# Patient Record
Sex: Female | Born: 1935 | Race: Black or African American | Hispanic: No | Smoking: Never smoker
Health system: Southern US, Community
[De-identification: ages and names within clinical notes are randomized; demographics above are authoritative.]

## PROBLEM LIST (undated history)

## (undated) DIAGNOSIS — E119 Type 2 diabetes mellitus without complications: Secondary | ICD-10-CM

## (undated) DIAGNOSIS — J189 Pneumonia, unspecified organism: Secondary | ICD-10-CM

## (undated) DIAGNOSIS — M1990 Unspecified osteoarthritis, unspecified site: Secondary | ICD-10-CM

## (undated) DIAGNOSIS — B029 Zoster without complications: Secondary | ICD-10-CM

## (undated) DIAGNOSIS — E876 Hypokalemia: Secondary | ICD-10-CM

## (undated) DIAGNOSIS — I10 Essential (primary) hypertension: Secondary | ICD-10-CM

## (undated) DIAGNOSIS — I639 Cerebral infarction, unspecified: Secondary | ICD-10-CM

## (undated) HISTORY — DX: Zoster without complications: B02.9

## (undated) HISTORY — PX: ABDOMINAL HYSTERECTOMY: SHX81

## (undated) HISTORY — PX: KNEE SURGERY: SHX244

## (undated) HISTORY — PX: TONSILLECTOMY: SUR1361

## (undated) HISTORY — PX: BREAST EXCISIONAL BIOPSY: SUR124

## (undated) HISTORY — DX: Unspecified osteoarthritis, unspecified site: M19.90

## (undated) HISTORY — DX: Cerebral infarction, unspecified: I63.9

## (undated) HISTORY — DX: Pneumonia, unspecified organism: J18.9

---

## 1998-11-04 ENCOUNTER — Emergency Department (HOSPITAL_COMMUNITY): Admission: EM | Admit: 1998-11-04 | Discharge: 1998-11-04 | Payer: Self-pay | Admitting: Emergency Medicine

## 1998-12-15 ENCOUNTER — Emergency Department (HOSPITAL_COMMUNITY): Admission: EM | Admit: 1998-12-15 | Discharge: 1998-12-15 | Payer: Self-pay | Admitting: Emergency Medicine

## 1998-12-15 ENCOUNTER — Encounter: Payer: Self-pay | Admitting: Emergency Medicine

## 1998-12-18 ENCOUNTER — Emergency Department (HOSPITAL_COMMUNITY): Admission: EM | Admit: 1998-12-18 | Discharge: 1998-12-18 | Payer: Self-pay | Admitting: Emergency Medicine

## 1998-12-19 ENCOUNTER — Emergency Department (HOSPITAL_COMMUNITY): Admission: EM | Admit: 1998-12-19 | Discharge: 1998-12-19 | Payer: Self-pay | Admitting: Emergency Medicine

## 1998-12-21 ENCOUNTER — Emergency Department (HOSPITAL_COMMUNITY): Admission: EM | Admit: 1998-12-21 | Discharge: 1998-12-21 | Payer: Self-pay | Admitting: Emergency Medicine

## 1999-12-03 ENCOUNTER — Ambulatory Visit (HOSPITAL_COMMUNITY): Admission: RE | Admit: 1999-12-03 | Discharge: 1999-12-03 | Payer: Self-pay | Admitting: Gastroenterology

## 1999-12-03 ENCOUNTER — Encounter: Payer: Self-pay | Admitting: Gastroenterology

## 2000-01-06 ENCOUNTER — Encounter: Admission: RE | Admit: 2000-01-06 | Discharge: 2000-01-06 | Payer: Self-pay | Admitting: Obstetrics and Gynecology

## 2000-01-06 ENCOUNTER — Encounter: Payer: Self-pay | Admitting: Obstetrics and Gynecology

## 2000-01-14 ENCOUNTER — Ambulatory Visit (HOSPITAL_COMMUNITY): Admission: RE | Admit: 2000-01-14 | Discharge: 2000-01-14 | Payer: Self-pay | Admitting: Gastroenterology

## 2001-03-05 ENCOUNTER — Encounter: Payer: Self-pay | Admitting: Obstetrics and Gynecology

## 2001-03-05 ENCOUNTER — Encounter: Admission: RE | Admit: 2001-03-05 | Discharge: 2001-03-05 | Payer: Self-pay | Admitting: Obstetrics and Gynecology

## 2001-09-14 ENCOUNTER — Encounter: Payer: Self-pay | Admitting: Emergency Medicine

## 2001-09-14 ENCOUNTER — Emergency Department (HOSPITAL_COMMUNITY): Admission: EM | Admit: 2001-09-14 | Discharge: 2001-09-14 | Payer: Self-pay | Admitting: Emergency Medicine

## 2003-04-09 ENCOUNTER — Emergency Department (HOSPITAL_COMMUNITY): Admission: EM | Admit: 2003-04-09 | Discharge: 2003-04-09 | Payer: Self-pay | Admitting: Emergency Medicine

## 2003-04-25 ENCOUNTER — Encounter: Admission: RE | Admit: 2003-04-25 | Discharge: 2003-04-25 | Payer: Self-pay | Admitting: Obstetrics and Gynecology

## 2003-04-25 ENCOUNTER — Encounter: Payer: Self-pay | Admitting: Obstetrics and Gynecology

## 2003-07-21 ENCOUNTER — Emergency Department (HOSPITAL_COMMUNITY): Admission: EM | Admit: 2003-07-21 | Discharge: 2003-07-21 | Payer: Self-pay | Admitting: Emergency Medicine

## 2003-09-29 ENCOUNTER — Emergency Department (HOSPITAL_COMMUNITY): Admission: EM | Admit: 2003-09-29 | Discharge: 2003-09-29 | Payer: Self-pay | Admitting: Emergency Medicine

## 2003-11-15 ENCOUNTER — Emergency Department (HOSPITAL_COMMUNITY): Admission: EM | Admit: 2003-11-15 | Discharge: 2003-11-15 | Payer: Self-pay | Admitting: Emergency Medicine

## 2004-05-29 ENCOUNTER — Emergency Department (HOSPITAL_COMMUNITY): Admission: EM | Admit: 2004-05-29 | Discharge: 2004-05-29 | Payer: Self-pay | Admitting: Emergency Medicine

## 2005-04-10 ENCOUNTER — Encounter: Admission: RE | Admit: 2005-04-10 | Discharge: 2005-04-10 | Payer: Self-pay | Admitting: Cardiology

## 2005-05-28 ENCOUNTER — Ambulatory Visit (HOSPITAL_COMMUNITY): Admission: RE | Admit: 2005-05-28 | Discharge: 2005-05-28 | Payer: Self-pay | Admitting: Cardiology

## 2007-06-05 ENCOUNTER — Emergency Department (HOSPITAL_COMMUNITY): Admission: EM | Admit: 2007-06-05 | Discharge: 2007-06-05 | Payer: Self-pay | Admitting: Emergency Medicine

## 2007-12-15 ENCOUNTER — Emergency Department (HOSPITAL_COMMUNITY): Admission: EM | Admit: 2007-12-15 | Discharge: 2007-12-15 | Payer: Self-pay | Admitting: Emergency Medicine

## 2008-05-09 ENCOUNTER — Encounter: Admission: RE | Admit: 2008-05-09 | Discharge: 2008-05-09 | Payer: Self-pay | Admitting: Internal Medicine

## 2009-10-22 ENCOUNTER — Emergency Department (HOSPITAL_COMMUNITY): Admission: EM | Admit: 2009-10-22 | Discharge: 2009-10-22 | Payer: Self-pay | Admitting: Emergency Medicine

## 2010-05-11 ENCOUNTER — Emergency Department (HOSPITAL_COMMUNITY)
Admission: EM | Admit: 2010-05-11 | Discharge: 2010-05-11 | Payer: Self-pay | Source: Home / Self Care | Admitting: Emergency Medicine

## 2010-12-20 NOTE — Procedures (Signed)
[facility]  Patient:    Anna Hoffman, Anna Hoffman                          MRN: [id number] Proc. Date: 12/03/99 Adm. Date:  [id number] Disc. Date: [id number] Attending:  Harriette Bouillon CC:         Glory Buff, M.D. [facility]                           Procedure Report  PROCEDURE:  Panendoscopy.  INDICATIONS:  The patient is a 76 year old African-American female undergoing endoscopy to evaluate complaints of episodic epigastric pain, associated nausea, vomiting and diarrhea.  Symptoms typically occurred after eating sweets.  The pain lasts for several hours of residual nausea, vomiting and diarrhea are protracted over the next several days.  Abdominal ultrasound was negative.  She is undergoing endoscopy to evaluate further possible GI pathology.  DESCRIPTION OF PROCEDURE:  After reviewing the nature of the procedure with the  patient including potential risks and complications, and after discussing alternatives methods of diagnosis and treatment, informed consent signed.  The patient was premedicated, receiving topical anesthetic followed by IV sedation totalling Versed 5 mg, fentanyl 50 mcg IV.  Using Olympus video endoscope the proximal esophagus was intubated under direct  vision.  Normal oropharynx.  No lesion of the epiglottis, vocal cords or piriform sinus.  Proximal mid and distal segments of the esophagus normal.  Mucosal was Z-line distinct at 34 cm.  Large hiatal hernia extending to 38 cm. Non-inflamed  Gastric, fundus, body and antrum normal.  Pylorus symmetric.  Duodenal bulb and  second portion normal.  Retroflexed view of the annularis/curved gastric fundus  negative.  Stomach decompressed.  Scope withdrawn.  The patient tolerated the procedure without difficulty, being maintained on Datascope monitor and low-flow oxygen throughout.  ASSESSMENT: 1. Hiatal hernia--large, non-inflamed. 2. Normal endoscopy--no lesion to explain  abdominal pain or symptoms of    nausea, vomiting, diarrhea.  Possible reactive hypoglycemia.  RECOMMENDATIONS: 1. Colonoscopy as planned. 2. Continue to avoid sweets which has resulted in resolution of symptoms    thus far. DD:  12/03/99 TD:  12/05/99 Job: [id number]

## 2010-12-20 NOTE — Procedures (Signed)
[facility]  Patient:    Anna Hoffman, Anna Hoffman                          MRN: [id number] Proc. Date: 01/14/00 Adm. Date:  [id number] Disc. Date: [id number] Attending:  Harriette Bouillon CC:         Glory Buff, M.D. [facility]                           Procedure Report  PROCEDURE PERFORMED:  Colonoscopy.  ENDOSCOPIST:  Mayme Genta, M.D.  INDICATIONS FOR PROCEDURE:  The patient is a 76 year old African-American female undergoing colonoscopy for neoplasia surveillance and to evaluate complaints of diarrhea, nausea and vomiting.  DESCRIPTION OF PROCEDURE:  After reviewing the nature of the procedure with the patient including potential risks and complications, and after discussing alternative methods of diagnosis and treatment, informed consent was signed. The patient was premedicated receiving Versed 7.5 mg, fentanyl 75 mcg administered IV in divided doses prior to the onset of the procedure.  Using an Olympus pediatric PCF-140L video colonoscope, rectum was intubated after normal digital examination.  Scope was advanced around the entire length of the colon to the cecum, identified by the appendiceal orifice and ileocecal valve.  Intubation was difficult due to redundancy of the colon.  The terminal ileum was intubated over its distal 10 cm and appeared entirely normal.  Scopte retracted into the cecum.  The remainder of the colon was examined in retrograde manner with excellent digitalization throughout.  No abnormalities noted. No evidence of neoplasia, mucosal inflammation, diverticular disease or vascular abnormality.  Retroflex view of the rectal vault was similarly normal.  The colon was decompressed and scope withdrawn.  The patient tolerated the procedure without difficulty being maintained on Datascope monitor and low-flow oxygen throughout.  Returned to recovery in stable condition.  TIME:  2.  TECHNICAL:  2.  PREPARATION:  2.  TOTAL  SCORE:  6  ASSESSMENT: 1. Normal colonoscopy. 2. No colorectal neoplasia. 3. No lesion to explain diarrhea.  RECOMMENDATIONS: 1. Repeat colonoscopy in 10 years. 2. Hemoccult--anually. 3. ROV p.r.n.   RECOMMENDATIONS: 1. Postpolypectomy instructions reviewed. 2. Follow up pathology. 3. Repeat colonoscopy in five years for ongoing surveillance. DD:  01/14/00 TD:  01/17/00 Job: [id number]

## 2011-02-05 ENCOUNTER — Emergency Department (HOSPITAL_COMMUNITY): Payer: Medicare Other

## 2011-02-05 ENCOUNTER — Emergency Department (HOSPITAL_COMMUNITY)
Admission: EM | Admit: 2011-02-05 | Discharge: 2011-02-05 | Disposition: A | Discharge status: Home / Self Care | Payer: Medicare Other | Attending: Emergency Medicine | Admitting: Emergency Medicine

## 2011-02-05 DIAGNOSIS — M25579 Pain in unspecified ankle and joints of unspecified foot: Secondary | ICD-10-CM | POA: Insufficient documentation

## 2011-02-05 DIAGNOSIS — S0990XA Unspecified injury of head, initial encounter: Secondary | ICD-10-CM | POA: Insufficient documentation

## 2011-02-05 DIAGNOSIS — S0003XA Contusion of scalp, initial encounter: Secondary | ICD-10-CM | POA: Insufficient documentation

## 2011-02-05 DIAGNOSIS — I10 Essential (primary) hypertension: Secondary | ICD-10-CM | POA: Insufficient documentation

## 2011-02-05 DIAGNOSIS — S1093XA Contusion of unspecified part of neck, initial encounter: Secondary | ICD-10-CM | POA: Insufficient documentation

## 2011-02-05 DIAGNOSIS — M5030 Other cervical disc degeneration, unspecified cervical region: Secondary | ICD-10-CM | POA: Insufficient documentation

## 2011-02-05 DIAGNOSIS — W1809XA Striking against other object with subsequent fall, initial encounter: Secondary | ICD-10-CM | POA: Insufficient documentation

## 2011-02-05 DIAGNOSIS — M1990 Unspecified osteoarthritis, unspecified site: Secondary | ICD-10-CM | POA: Insufficient documentation

## 2011-09-10 ENCOUNTER — Encounter (HOSPITAL_COMMUNITY): Payer: Self-pay | Admitting: Physician Assistant

## 2011-09-10 ENCOUNTER — Emergency Department (INDEPENDENT_AMBULATORY_CARE_PROVIDER_SITE_OTHER)
Admission: EM | Admit: 2011-09-10 | Discharge: 2011-09-10 | Disposition: A | Discharge status: Home / Self Care | Payer: Medicare Other | Source: Home / Self Care

## 2011-09-10 DIAGNOSIS — I10 Essential (primary) hypertension: Secondary | ICD-10-CM

## 2011-09-10 DIAGNOSIS — J019 Acute sinusitis, unspecified: Secondary | ICD-10-CM

## 2011-09-10 HISTORY — DX: Essential (primary) hypertension: I10

## 2011-09-10 MED ORDER — AMOXICILLIN 500 MG PO CAPS: 500.0000 mg | ORAL_CAPSULE | Freq: Three times a day (TID) | ORAL | Status: AC

## 2011-09-10 NOTE — ED Provider Notes (Signed)
History     CSN: [id number]  Arrival date & time 09/10/11  1514   None     Chief Complaint  Patient presents with  . Sinusitis  . Hypertension    (Consider location/radiation/quality/duration/timing/severity/associated sxs/prior treatment) HPI Comments: Pt reports onset 3 days ago of chills. Yesterday she began having sore throat, yellow nasal drainage, post nasal drainage, headache and sinus pressure. She tried to schedule an appt with her PCP today but didn't have available openings. Pt states she didn't take her BP medication today because she took Advil for her cold symptoms. She denies cough, dyspnea or chest pain.    Past Medical History  Diagnosis Date  . Hypertension     No past surgical history on file.  No family history on file.  History  Substance Use Topics  . Smoking status: Not on file  . Smokeless tobacco: Not on file  . Alcohol Use:     OB History    Grav Para Term Preterm Abortions TAB SAB Ect Mult Living                  Review of Systems  Constitutional: Positive for fatigue. Negative for fever and chills.  HENT: Positive for ear pain, congestion, sore throat, rhinorrhea, postnasal drip and sinus pressure. Negative for sneezing.   Respiratory: Negative for cough, shortness of breath and wheezing.   Cardiovascular: Negative for chest pain and palpitations.    Allergies  Review of patient's allergies indicates no known allergies.  Home Medications   Current Outpatient Rx  Name Route Sig Dispense Refill  . AMOXICILLIN 500 MG PO CAPS Oral Take 1 capsule (500 mg total) by mouth 3 (three) times daily. 30 capsule 0    BP 180/102  Pulse 93  Temp(Src) 98.1 F (36.7 C) (Oral)  Resp 16  SpO2 96%  Physical Exam  Nursing note and vitals reviewed. Constitutional: She appears well-developed and well-nourished. No distress.  HENT:  Head: Normocephalic and atraumatic.  Right Ear: Tympanic membrane, external ear and ear canal normal.  Left Ear:  Tympanic membrane, external ear and ear canal normal.  Nose: Nose normal.  Mouth/Throat: Uvula is midline, oropharynx is clear and moist and mucous membranes are normal. No oropharyngeal exudate, posterior oropharyngeal edema or posterior oropharyngeal erythema.  Neck: Neck supple.  Cardiovascular: Normal rate, regular rhythm and normal heart sounds.   Pulmonary/Chest: Effort normal and breath sounds normal. No respiratory distress.  Lymphadenopathy:    She has no cervical adenopathy.  Neurological: She is alert.  Skin: Skin is warm and dry.  Psychiatric: She has a normal mood and affect.    ED Course  Procedures (including critical care time)  Labs Reviewed - No data to display No results found.   1. Sinusitis acute   2. Hypertension       MDM  Hx of HTN. Pt reports that she has not taken BP medication today, but is usually compliant with medication. Onset of sinus pressure and nasal congestion with discolored nasal mucus yesterday. Chills without fever x 4 days.         Carmel [place], [place] 09/10/11 [phone]

## 2011-09-10 NOTE — ED Notes (Signed)
PT HERE WITH SINUS SX SORE THROAT,RUNNY NOSE,POST NASAL DRAINAGE AND H/A THAT STARTED YESTERDAY.BIALT EARS POPPING AND BP 180/102.PT HAS HX HTN AND DIDN'T TAKE MEDS TODAY.SINUS MEDS TAKEN BUT NO RELIEF

## 2011-09-15 NOTE — ED Provider Notes (Signed)
Medical screening examination/treatment/procedure(s) were performed by non-physician practitioner and as supervising physician I was immediately available for consultation/collaboration.  Howell Rucks, MD 09/15/11 1454

## 2011-12-06 ENCOUNTER — Emergency Department (HOSPITAL_COMMUNITY)
Admission: EM | Admit: 2011-12-06 | Discharge: 2011-12-06 | Disposition: A | Discharge status: Home / Self Care | Payer: Medicare Other | Attending: Emergency Medicine | Admitting: Emergency Medicine

## 2011-12-06 ENCOUNTER — Emergency Department (HOSPITAL_COMMUNITY): Payer: Medicare Other

## 2011-12-06 ENCOUNTER — Encounter (HOSPITAL_COMMUNITY): Payer: Self-pay | Admitting: Family Medicine

## 2011-12-06 DIAGNOSIS — W19XXXA Unspecified fall, initial encounter: Secondary | ICD-10-CM

## 2011-12-06 DIAGNOSIS — Y9229 Other specified public building as the place of occurrence of the external cause: Secondary | ICD-10-CM | POA: Insufficient documentation

## 2011-12-06 DIAGNOSIS — R51 Headache: Secondary | ICD-10-CM | POA: Insufficient documentation

## 2011-12-06 DIAGNOSIS — S0003XA Contusion of scalp, initial encounter: Secondary | ICD-10-CM | POA: Insufficient documentation

## 2011-12-06 DIAGNOSIS — S7010XA Contusion of unspecified thigh, initial encounter: Secondary | ICD-10-CM | POA: Insufficient documentation

## 2011-12-06 DIAGNOSIS — I10 Essential (primary) hypertension: Secondary | ICD-10-CM | POA: Insufficient documentation

## 2011-12-06 DIAGNOSIS — T1490XA Injury, unspecified, initial encounter: Secondary | ICD-10-CM | POA: Insufficient documentation

## 2011-12-06 DIAGNOSIS — W010XXA Fall on same level from slipping, tripping and stumbling without subsequent striking against object, initial encounter: Secondary | ICD-10-CM | POA: Insufficient documentation

## 2011-12-06 MED ORDER — ACETAMINOPHEN 325 MG PO TABS
650.0000 mg | ORAL_TABLET | Freq: Once | ORAL | Status: AC
  Administered 2011-12-06: 650 mg via ORAL
  Filled 2011-12-06: qty 2

## 2011-12-06 MED ORDER — ONDANSETRON HCL 4 MG PO TABS: 8.0000 mg | ORAL_TABLET | Freq: Three times a day (TID) | ORAL | Status: AC | PRN

## 2011-12-06 NOTE — ED Notes (Signed)
Patient states that she was at the [employer] this morning and tripped and fell and fell onto the concrete striking her head. Denies LOC or visual changes. Reports having a headache and intermittent nausea.

## 2011-12-06 NOTE — ED Notes (Signed)
When pt states she felt sick, I ask if she was nausea she said no,  Just "sick feeling",  No visual disturbances,  No vomiting, no LOC

## 2011-12-06 NOTE — ED Provider Notes (Signed)
History     CSN: [id number]  Arrival date & time 12/06/11  1932   First MD Initiated Contact with Patient 12/06/11 2049      Chief Complaint  Patient presents with  . Fall  . Head Injury  . Headache    (Consider location/radiation/quality/duration/timing/severity/associated sxs/prior treatment) HPI Patient tripped and fell this morning while walking in the [employer] striking her fore head and her right thigh. She complains of frontal headache, nonradiating dull and right lateral thigh pain. Denies neck pain pain is mild associated symptoms include nausea pain is not made better or worse by anything. No other associated symptoms. Patient has been ambulatory since the event Past Medical History  Diagnosis Date  . Hypertension     Past Surgical History  Procedure Date  . Abdominal hysterectomy   . Knee surgery     No family history on file.  History  Substance Use Topics  . Smoking status: Never Smoker   . Smokeless tobacco: Not on file  . Alcohol Use: No    OB History    Grav Para Term Preterm Abortions TAB SAB Ect Mult Living                  Review of Systems  Constitutional: Negative.   Respiratory: Negative.   Cardiovascular: Negative.   Gastrointestinal: Positive for nausea.  Musculoskeletal: Positive for myalgias.       Pain at right THIGH   Skin: Negative.   Neurological: Positive for headaches.  Hematological: Negative.   Psychiatric/Behavioral: Negative.   All other systems reviewed and are negative.    Allergies  Amoxicillin and Sulfa antibiotics  Home Medications   Current Outpatient Rx  Name Route Sig Dispense Refill  . FEXOFENADINE HCL 180 MG PO TABS Oral Take 180 mg by mouth daily.    . STRESS TAB NF PO Oral Take 1 tablet by mouth daily.    Marland Kitchen OLMESARTAN-AMLODIPINE-HCTZ 20-5-12.5 MG PO TABS Oral Take 1 tablet by mouth daily.    Marland Kitchen VITAMIN B-12 1000 MCG PO TABS Oral Take 1,000 mcg by mouth daily.      BP 136/83  Pulse 101   Temp(Src) 97.7 F (36.5 C) (Oral)  Resp 18  SpO2 97%  Physical Exam  Nursing note and vitals reviewed. Constitutional: She is oriented to person, place, and time. She appears well-developed and well-nourished.  HENT:       3 cm hematoma with ecchymosis left for head otherwise normocephalic atraumatic. Bilateral tympanic membranes normal  Eyes: Conjunctivae are normal. Pupils are equal, round, and reactive to light.  Neck: Neck supple. No tracheal deviation present. No thyromegaly present.       No tenderness  Cardiovascular: Normal rate and regular rhythm.   No murmur heard. Pulmonary/Chest: Effort normal and breath sounds normal.  Abdominal: Soft. Bowel sounds are normal. She exhibits no distension. There is no tenderness.  Musculoskeletal: Normal range of motion. She exhibits no edema and no tenderness.       Right lower extremity there is a baseball sized eccymosis at lateral thigh, reddish purple in appearance. No deformity no point tenderness. Entire spine nontender pelvis stable nontender. All 4 extremities neurovascularly intact  Neurological: She is alert and oriented to person, place, and time. She has normal reflexes. No cranial nerve deficit. Coordination normal.       Gait normal  Skin: Skin is warm and dry. No rash noted.  Psychiatric: She has a normal mood and affect.    ED  Course  Procedures (including critical care time)  Labs Reviewed - No data to display No results found.   No diagnosis found.  10:30 PM feels improved after treatment with Tylenol. Patient alert Glasgow Coma Score 15  MDM  Plan prescriptions Zofran. Tylenol for pain Diagnosis  #1 fall #2 minor closed head injury #3 contusion right thigh        Orlie Dakin, MD 12/06/11 2234

## 2011-12-06 NOTE — ED Notes (Addendum)
Pt presented to the ER with c/o headache and head injury secondary to the fall, pt states that her foot got cot in the car wheel and fell down hitting the head on the concrete. Noted hematoma to the left frontal area of the head. Py also reprots nausea and dizziness.

## 2011-12-06 NOTE — ED Notes (Signed)
Pt states she is ready for disposition Dr Milagros Evener aware,  Pt informed

## 2011-12-06 NOTE — ED Notes (Signed)
Patient transported to CT 

## 2012-11-10 ENCOUNTER — Emergency Department (HOSPITAL_COMMUNITY): Payer: Medicare Other

## 2012-11-10 ENCOUNTER — Emergency Department (HOSPITAL_COMMUNITY)
Admission: EM | Admit: 2012-11-10 | Discharge: 2012-11-10 | Disposition: A | Discharge status: Home / Self Care | Payer: Medicare Other | Attending: Emergency Medicine | Admitting: Emergency Medicine

## 2012-11-10 ENCOUNTER — Encounter (HOSPITAL_COMMUNITY): Payer: Self-pay | Admitting: Emergency Medicine

## 2012-11-10 DIAGNOSIS — R0989 Other specified symptoms and signs involving the circulatory and respiratory systems: Secondary | ICD-10-CM | POA: Insufficient documentation

## 2012-11-10 DIAGNOSIS — I10 Essential (primary) hypertension: Secondary | ICD-10-CM | POA: Insufficient documentation

## 2012-11-10 DIAGNOSIS — H9209 Otalgia, unspecified ear: Secondary | ICD-10-CM | POA: Insufficient documentation

## 2012-11-10 DIAGNOSIS — J3489 Other specified disorders of nose and nasal sinuses: Secondary | ICD-10-CM | POA: Insufficient documentation

## 2012-11-10 DIAGNOSIS — R509 Fever, unspecified: Secondary | ICD-10-CM | POA: Insufficient documentation

## 2012-11-10 DIAGNOSIS — R51 Headache: Secondary | ICD-10-CM | POA: Insufficient documentation

## 2012-11-10 DIAGNOSIS — J029 Acute pharyngitis, unspecified: Secondary | ICD-10-CM | POA: Insufficient documentation

## 2012-11-10 DIAGNOSIS — J069 Acute upper respiratory infection, unspecified: Secondary | ICD-10-CM | POA: Insufficient documentation

## 2012-11-10 MED ORDER — FENTANYL CITRATE 0.05 MG/ML IJ SOLN
100.0000 ug | Freq: Once | INTRAMUSCULAR | Status: DC
Start: 2012-11-10 — End: 2012-11-10

## 2012-11-10 NOTE — ED Provider Notes (Signed)
History     CSN: [id number]  Arrival date & time 11/10/12  1510   None     Chief Complaint  Patient presents with  . Sore Throat  . Sneezing   . Cough    (Consider location/radiation/quality/duration/timing/severity/associated sxs/prior treatment) HPI 78 yo F with HTN presenting with 2 days of subjective fever, cough, nasal congestion, rhinorrhea, sore throat and ear pain.  Subjective fever on Monday and Tuesday night.  No fevers today.  Denies any shortness of breath, chest pain, abdominal pain, n/v/d.  Thinks she may be developing a sinus infection due to greenish nasal discharge and some frontal headache.  Eating and drinking well at home.  Came in today because she was unable to see her doctor until Monday.   Past Medical History  Diagnosis Date  . Hypertension     Past Surgical History  Procedure Laterality Date  . Abdominal hysterectomy    . Knee surgery      No family history on file.  History  Substance Use Topics  . Smoking status: Never Smoker   . Smokeless tobacco: Not on file  . Alcohol Use: No    OB History   Grav Para Term Preterm Abortions TAB SAB Ect Mult Living                  Review of Systems  Constitutional: Positive for fever. Negative for fatigue.  HENT: Positive for ear pain, congestion, sore throat, rhinorrhea, sneezing and sinus pressure. Negative for trouble swallowing, neck pain and neck stiffness.   Eyes: Negative.   Respiratory: Positive for cough. Negative for shortness of breath.   Cardiovascular: Negative for chest pain.  Gastrointestinal: Negative for nausea, vomiting, abdominal pain and diarrhea.  Genitourinary: Negative.   Musculoskeletal: Negative.   Skin: Negative.   Neurological: Negative.     Allergies  Amoxicillin and Sulfa antibiotics  Home Medications   Current Outpatient Rx  Name  Route  Sig  Dispense  Refill  . dextromethorphan (DELSYM) 30 MG/5ML liquid   Oral   Take 60 mg by mouth 2 (two) times daily as  needed for cough.         . fexofenadine (ALLEGRA) 180 MG tablet   Oral   Take 180 mg by mouth daily.         Marland Kitchen ibuprofen (ADVIL,MOTRIN) 200 MG tablet   Oral   Take 400 mg by mouth every 8 (eight) hours as needed for pain.         . Multiple Vitamins-Minerals (STRESS TAB NF PO)   Oral   Take 1 tablet by mouth daily.         . Olmesartan-Amlodipine-HCTZ (TRIBENZOR) 20-5-12.5 MG TABS   Oral   Take 1 tablet by mouth daily.         Marland Kitchen oxymetazoline (AFRIN) 0.05 % nasal spray   Nasal   Place 2 sprays into the nose 2 (two) times daily as needed for congestion.         . vitamin B-12 (CYANOCOBALAMIN) 1000 MCG tablet   Oral   Take 1,000 mcg by mouth daily.           BP 138/89  Pulse 89  Temp(Src) 97.8 F (36.6 C) (Oral)  Resp 16  SpO2 97%  Physical Exam  Constitutional: She is oriented to person, place, and time. She appears well-developed and well-nourished. No distress.  HENT:  Head: Normocephalic and atraumatic.  Right Ear: Tympanic membrane and external ear normal.  Left  Ear: Tympanic membrane and external ear normal.  Mouth/Throat: Oropharynx is clear and moist. No oropharyngeal exudate.  Mild tenderness over right maxillary sinus  Eyes: Conjunctivae and EOM are normal. Pupils are equal, round, and reactive to light. Right eye exhibits no discharge. Left eye exhibits no discharge. No scleral icterus.  Neck: Normal range of motion. Neck supple.  Cardiovascular: Normal rate, regular rhythm, normal heart sounds and intact distal pulses.   No murmur heard. Pulmonary/Chest: Effort normal and breath sounds normal. No respiratory distress. She has no wheezes. She has no rales.  Scattered rhonchi that cleared with cough  Abdominal: Soft. Bowel sounds are normal. There is no tenderness. There is no rebound and no guarding.  Musculoskeletal: She exhibits no edema and no tenderness.  Lymphadenopathy:    She has no cervical adenopathy.  Neurological: She is alert  and oriented to person, place, and time. She exhibits normal muscle tone.  Skin: Skin is warm and dry. No rash noted. She is not diaphoretic. No erythema.    ED Course  Procedures (including critical care time)  Labs Reviewed - No data to display Dg Chest 2 View  11/10/2012  *RADIOLOGY REPORT*  Clinical Data: Cough.  CHEST - 2 VIEW  Comparison: Two-view chest 04/09/2005.  Findings: The heart is mildly enlarged.  A moderate sized hiatal hernia is again seen.  The lung volumes are low.  No focal airspace disease is evident.  The visualized soft tissues and bony thorax are unremarkable.  IMPRESSION:  1.  Borderline cardiomegaly without failure. 2.  The moderate sized hiatal hernia. 3.  No acute cardiopulmonary disease.   Original Report Authenticated By: San Morelle, M.D.      No diagnosis found.    MDM  78 yo F with HTN presenting with URI symptoms and subjective fever.  Afebrile here.  Tolerating PO well at home without n/v.  CXR negative for pneumonia.  Exam unremarkable.  Discussed symptomatic care with tylenol/ibuprofen for fevers/headache and netti-pot for sinus symptoms.  Discussed expected time course of viral illness.  Follow up with PCP on Monday if not improving.        Sibyl Parr, MD 11/10/12 [phone]

## 2012-11-10 NOTE — ED Notes (Signed)
Pt states that since Monday night she has had a cough, sore throat, and sneezing since Monday night.  States that she thinks her allergies are acting up.

## 2012-11-10 NOTE — ED Notes (Signed)
Resident at bedside speaking with pt.

## 2012-11-11 NOTE — ED Provider Notes (Signed)
I saw  the patient, reviewed the resident's note and I agree with the findings and plan.   .Face to face Exam:  General:  Awake HEENT:  Atraumatic Resp:  Normal effort Abd:  Nondistended Neuro:No focal weakness   Dot Lanes, MD 11/11/12 1306

## 2012-12-30 ENCOUNTER — Other Ambulatory Visit: Payer: Self-pay | Admitting: Internal Medicine

## 2012-12-30 DIAGNOSIS — Z1231 Encounter for screening mammogram for malignant neoplasm of breast: Secondary | ICD-10-CM

## 2013-02-02 ENCOUNTER — Ambulatory Visit: Payer: Medicare Other

## 2013-02-24 ENCOUNTER — Ambulatory Visit
Admission: RE | Admit: 2013-02-24 | Discharge: 2013-02-24 | Disposition: A | Discharge status: Home / Self Care | Payer: Medicare Other | Source: Ambulatory Visit | Attending: Internal Medicine | Admitting: Internal Medicine

## 2013-02-24 DIAGNOSIS — Z1231 Encounter for screening mammogram for malignant neoplasm of breast: Secondary | ICD-10-CM

## 2013-06-08 ENCOUNTER — Encounter: Payer: Medicare Other | Attending: Internal Medicine | Admitting: Dietician

## 2013-06-08 VITALS — Ht 62.5 in | Wt 148.8 lb

## 2013-06-08 DIAGNOSIS — Z713 Dietary counseling and surveillance: Secondary | ICD-10-CM | POA: Insufficient documentation

## 2013-06-08 DIAGNOSIS — E119 Type 2 diabetes mellitus without complications: Secondary | ICD-10-CM | POA: Insufficient documentation

## 2013-06-08 NOTE — Progress Notes (Signed)
Patient was seen on 06/08/13 for the first of a series of three diabetes self-management courses at the [facility].  Current HbA1c: 6.7 on 9/11  The following learning objectives were met by the patient during this class:  Describe diabetes  State some common risk factors for diabetes  Defines the role of glucose and insulin  Identifies type of diabetes and pathophysiology  Describe the relationship between diabetes and cardiovascular risk  State the members of the Healthcare Team  States the rationale for glucose monitoring  State when to test glucose  State their individual Target Range  State the importance of logging glucose readings  Describe how to interpret glucose readings  Identifies A1C target  Explain the correlation between A1c and eAG values  State symptoms and treatment of high blood glucose  State symptoms and treatment of low blood glucose  Explain proper technique for glucose testing  Identifies proper sharps disposal  Handouts given during class include:  Living Well with Diabetes book  Carb Counting and Meal Planning book  Meal Plan Card  Carbohydrate guide  Meal planning worksheet  Low Sodium Flavoring Tips  The diabetes portion plate  Low Carbohydrate Snack Suggestions  A1c to eAG Conversion Chart  Diabetes Medications  Stress Management  Diabetes Recommended Care Schedule  Diabetes Success Plan  Core Class Satisfaction Survey  Your patient has identified their diabetes care support plan as:  [facility]  Staff  Family  Follow-Up Plan:  Attend core 2

## 2013-06-08 NOTE — Patient Instructions (Signed)
Goals:  Monitor glucose levels as instructed by your doctor  Bring food record and glucose log to your next nutrition visit 

## 2013-06-15 DIAGNOSIS — E119 Type 2 diabetes mellitus without complications: Secondary | ICD-10-CM

## 2013-06-15 NOTE — Progress Notes (Signed)
Patient was seen on 06/15/13 for the second of a series of three diabetes self-management courses at the [facility]. The following learning objectives were met by the patient during this class:   Describe the role of different macronutrients on glucose  Explain how carbohydrates affect blood glucose  State what foods contain the most carbohydrates  Demonstrate carbohydrate counting  Demonstrate how to read Nutrition Facts food label  Describe effects of various fats on heart health  Describe the importance of good nutrition for health and healthy eating strategies  Describe techniques for managing your shopping, cooking and meal planning  List strategies to follow meal plan when dining out  Describe the effects of alcohol on glucose and how to use it safely   Follow-Up Plan:  Attend Core 3  Work towards following your personal food plan.

## 2013-06-22 DIAGNOSIS — E119 Type 2 diabetes mellitus without complications: Secondary | ICD-10-CM

## 2013-06-22 NOTE — Progress Notes (Signed)
Patient was seen on 06/22/13 for the third of a series of three diabetes self-management courses at the [facility]. The following learning objectives were met by the patient during this class:    State the amount of activity recommended for healthy living   Describe activities suitable for individual needs   Identify ways to regularly incorporate activity into daily life   Identify barriers to activity and ways to over come these barriers  Identify diabetes medications being personally used and their primary action for lowering glucose and possible side effects   Describe role of stress on blood glucose and develop strategies to address psychosocial issues   Identify diabetes complications and ways to prevent them  Explain how to manage diabetes during illness   Evaluate success in meeting personal goal   Establish 2-3 goals that they will plan to diligently work on until they return for the free 63-month follow-up visit  Your patient has established the following 4 month goals in their individualized success plan:  Patient did not set any goals.  She feels she is already doing everything she can  Your patient has identified these potential barriers to change:  No barriers indicated  Your patient has identified their diabetes self-care support plan as  friend

## 2013-10-18 ENCOUNTER — Ambulatory Visit: Payer: Medicare Other | Admitting: *Deleted

## 2014-01-31 ENCOUNTER — Other Ambulatory Visit: Payer: Self-pay

## 2014-01-31 DIAGNOSIS — Z1231 Encounter for screening mammogram for malignant neoplasm of breast: Secondary | ICD-10-CM

## 2014-02-28 ENCOUNTER — Ambulatory Visit: Payer: Medicare Other

## 2014-03-02 ENCOUNTER — Ambulatory Visit
Admission: RE | Admit: 2014-03-02 | Discharge: 2014-03-02 | Disposition: A | Discharge status: Home / Self Care | Payer: Medicare HMO | Source: Ambulatory Visit

## 2014-03-02 ENCOUNTER — Encounter (INDEPENDENT_AMBULATORY_CARE_PROVIDER_SITE_OTHER): Payer: Self-pay

## 2014-03-02 DIAGNOSIS — Z1231 Encounter for screening mammogram for malignant neoplasm of breast: Secondary | ICD-10-CM

## 2014-08-08 ENCOUNTER — Emergency Department (HOSPITAL_COMMUNITY)
Admission: EM | Admit: 2014-08-08 | Discharge: 2014-08-08 | Disposition: A | Discharge status: Home / Self Care | Payer: Medicare HMO | Attending: Emergency Medicine | Admitting: Emergency Medicine

## 2014-08-08 ENCOUNTER — Encounter (HOSPITAL_COMMUNITY): Payer: Self-pay | Admitting: Emergency Medicine

## 2014-08-08 ENCOUNTER — Emergency Department (HOSPITAL_COMMUNITY): Payer: Medicare HMO

## 2014-08-08 DIAGNOSIS — Y9389 Activity, other specified: Secondary | ICD-10-CM | POA: Insufficient documentation

## 2014-08-08 DIAGNOSIS — Z880 Allergy status to penicillin: Secondary | ICD-10-CM | POA: Insufficient documentation

## 2014-08-08 DIAGNOSIS — I10 Essential (primary) hypertension: Secondary | ICD-10-CM | POA: Diagnosis not present

## 2014-08-08 DIAGNOSIS — Y92410 Unspecified street and highway as the place of occurrence of the external cause: Secondary | ICD-10-CM | POA: Diagnosis not present

## 2014-08-08 DIAGNOSIS — Y998 Other external cause status: Secondary | ICD-10-CM | POA: Insufficient documentation

## 2014-08-08 DIAGNOSIS — Z79899 Other long term (current) drug therapy: Secondary | ICD-10-CM | POA: Diagnosis not present

## 2014-08-08 DIAGNOSIS — S29001A Unspecified injury of muscle and tendon of front wall of thorax, initial encounter: Secondary | ICD-10-CM | POA: Diagnosis present

## 2014-08-08 DIAGNOSIS — R0781 Pleurodynia: Secondary | ICD-10-CM
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
## 2014-08-08 NOTE — ED Notes (Signed)
Refused wheelchair 
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
## 2014-08-08 NOTE — ED Notes (Signed)
Patient transported to X-ray 

## 2014-08-31 ENCOUNTER — Encounter (HOSPITAL_COMMUNITY): Payer: Self-pay | Admitting: Emergency Medicine

## 2014-08-31 ENCOUNTER — Inpatient Hospital Stay (HOSPITAL_COMMUNITY)
Admission: EM | Admit: 2014-08-31 | Discharge: 2014-09-06 | DRG: 327 | Disposition: A | Discharge status: Home / Self Care | Payer: Medicare HMO | Attending: General Surgery | Admitting: General Surgery

## 2014-08-31 DIAGNOSIS — Z881 Allergy status to other antibiotic agents status: Secondary | ICD-10-CM

## 2014-08-31 DIAGNOSIS — I10 Essential (primary) hypertension: Secondary | ICD-10-CM | POA: Diagnosis present

## 2014-08-31 DIAGNOSIS — R0602 Shortness of breath: Secondary | ICD-10-CM

## 2014-08-31 DIAGNOSIS — K209 Esophagitis, unspecified without bleeding: Secondary | ICD-10-CM | POA: Diagnosis present

## 2014-08-31 DIAGNOSIS — N2889 Other specified disorders of kidney and ureter: Secondary | ICD-10-CM | POA: Diagnosis present

## 2014-08-31 DIAGNOSIS — K440 Diaphragmatic hernia with obstruction, without gangrene: Principal | ICD-10-CM | POA: Diagnosis present

## 2014-08-31 DIAGNOSIS — Z882 Allergy status to sulfonamides status: Secondary | ICD-10-CM

## 2014-08-31 DIAGNOSIS — K56600 Partial intestinal obstruction, unspecified as to cause: Secondary | ICD-10-CM

## 2014-08-31 DIAGNOSIS — K311 Adult hypertrophic pyloric stenosis: Secondary | ICD-10-CM | POA: Diagnosis present

## 2014-08-31 DIAGNOSIS — R000 Tachycardia, unspecified: Secondary | ICD-10-CM | POA: Diagnosis present

## 2014-08-31 DIAGNOSIS — K2210 Ulcer of esophagus without bleeding: Secondary | ICD-10-CM | POA: Diagnosis present

## 2014-08-31 DIAGNOSIS — R1013 Epigastric pain: Secondary | ICD-10-CM | POA: Diagnosis not present

## 2014-08-31 DIAGNOSIS — R1110 Vomiting, unspecified: Secondary | ICD-10-CM | POA: Diagnosis present

## 2014-08-31 DIAGNOSIS — Z90711 Acquired absence of uterus with remaining cervical stump: Secondary | ICD-10-CM | POA: Diagnosis present

## 2014-08-31 DIAGNOSIS — M179 Osteoarthritis of knee, unspecified: Secondary | ICD-10-CM | POA: Diagnosis present

## 2014-08-31 DIAGNOSIS — Z7982 Long term (current) use of aspirin: Secondary | ICD-10-CM

## 2014-08-31 DIAGNOSIS — K259 Gastric ulcer, unspecified as acute or chronic, without hemorrhage or perforation: Secondary | ICD-10-CM | POA: Diagnosis present

## 2014-08-31 DIAGNOSIS — Z0189 Encounter for other specified special examinations: Secondary | ICD-10-CM

## 2014-08-31 DIAGNOSIS — K210 Gastro-esophageal reflux disease with esophagitis: Secondary | ICD-10-CM | POA: Diagnosis present

## 2014-08-31 LAB — CBG MONITORING, ED: Glucose-Capillary: 123 mg/dL — ABNORMAL HIGH (ref 70–99)

## 2014-08-31 NOTE — ED Notes (Signed)
Patient states that she has been vomiting 'black' stuff since Sunday. Has not had a BM in 3 days and has only been able to eat jello and water. Alert and oriented.

## 2014-09-01 ENCOUNTER — Emergency Department (HOSPITAL_COMMUNITY): Payer: Medicare HMO

## 2014-09-01 ENCOUNTER — Encounter (HOSPITAL_COMMUNITY)
Admission: EM | Disposition: A | Discharge status: Home / Self Care | Payer: Self-pay | Source: Home / Self Care | Attending: General Surgery

## 2014-09-01 ENCOUNTER — Inpatient Hospital Stay (HOSPITAL_COMMUNITY): Payer: Medicare HMO | Admitting: Anesthesiology

## 2014-09-01 ENCOUNTER — Encounter (HOSPITAL_COMMUNITY): Payer: Self-pay

## 2014-09-01 ENCOUNTER — Inpatient Hospital Stay (HOSPITAL_COMMUNITY): Payer: Medicare HMO

## 2014-09-01 DIAGNOSIS — Z90711 Acquired absence of uterus with remaining cervical stump: Secondary | ICD-10-CM | POA: Diagnosis present

## 2014-09-01 DIAGNOSIS — K259 Gastric ulcer, unspecified as acute or chronic, without hemorrhage or perforation: Secondary | ICD-10-CM | POA: Diagnosis present

## 2014-09-01 DIAGNOSIS — K440 Diaphragmatic hernia with obstruction, without gangrene: Secondary | ICD-10-CM | POA: Diagnosis present

## 2014-09-01 DIAGNOSIS — Z881 Allergy status to other antibiotic agents status: Secondary | ICD-10-CM | POA: Diagnosis not present

## 2014-09-01 DIAGNOSIS — I10 Essential (primary) hypertension: Secondary | ICD-10-CM | POA: Diagnosis present

## 2014-09-01 DIAGNOSIS — Z882 Allergy status to sulfonamides status: Secondary | ICD-10-CM | POA: Diagnosis not present

## 2014-09-01 DIAGNOSIS — R000 Tachycardia, unspecified: Secondary | ICD-10-CM | POA: Diagnosis present

## 2014-09-01 DIAGNOSIS — M179 Osteoarthritis of knee, unspecified: Secondary | ICD-10-CM | POA: Diagnosis present

## 2014-09-01 DIAGNOSIS — K210 Gastro-esophageal reflux disease with esophagitis: Secondary | ICD-10-CM | POA: Diagnosis present

## 2014-09-01 DIAGNOSIS — R1013 Epigastric pain: Secondary | ICD-10-CM | POA: Diagnosis present

## 2014-09-01 DIAGNOSIS — Z7982 Long term (current) use of aspirin: Secondary | ICD-10-CM | POA: Diagnosis not present

## 2014-09-01 DIAGNOSIS — K2210 Ulcer of esophagus without bleeding: Secondary | ICD-10-CM | POA: Diagnosis present

## 2014-09-01 DIAGNOSIS — R1110 Vomiting, unspecified: Secondary | ICD-10-CM | POA: Diagnosis present

## 2014-09-01 DIAGNOSIS — K311 Adult hypertrophic pyloric stenosis: Secondary | ICD-10-CM | POA: Diagnosis present

## 2014-09-01 HISTORY — PX: ESOPHAGOGASTRODUODENOSCOPY: SHX5428

## 2014-09-01 LAB — BASIC METABOLIC PANEL
Anion gap: 10 (ref 5–15)
BUN: 35 mg/dL — ABNORMAL HIGH (ref 6–23)
CALCIUM: 9.4 mg/dL (ref 8.4–10.5)
CHLORIDE: 100 mmol/L (ref 96–112)
CO2: 30 mmol/L (ref 19–32)
CREATININE: 1.38 mg/dL — AB (ref 0.50–1.10)
GFR calc Af Amer: 42 mL/min — ABNORMAL LOW (ref >90)
GFR calc non Af Amer: 36 mL/min — ABNORMAL LOW (ref >90)
GLUCOSE: 174 mg/dL — AB (ref 70–99)
Potassium: 3.7 mmol/L (ref 3.5–5.1)
Sodium: 140 mmol/L (ref 135–145)

## 2014-09-01 LAB — COMPREHENSIVE METABOLIC PANEL
ALBUMIN: 5 g/dL (ref 3.5–5.2)
ALK PHOS: 93 U/L (ref 39–117)
ALT: 18 U/L (ref 0–35)
AST: 27 U/L (ref 0–37)
Anion gap: 14 (ref 5–15)
BUN: 37 mg/dL — AB (ref 6–23)
CALCIUM: 10.5 mg/dL (ref 8.4–10.5)
CHLORIDE: 99 mmol/L (ref 96–112)
CO2: 26 mmol/L (ref 19–32)
Creatinine, Ser: 1.46 mg/dL — ABNORMAL HIGH (ref 0.50–1.10)
GFR calc Af Amer: 39 mL/min — ABNORMAL LOW (ref >90)
GFR calc non Af Amer: 33 mL/min — ABNORMAL LOW (ref >90)
Glucose, Bld: 139 mg/dL — ABNORMAL HIGH (ref 70–99)
Potassium: 3.8 mmol/L (ref 3.5–5.1)
SODIUM: 139 mmol/L (ref 135–145)
Total Bilirubin: 1.1 mg/dL (ref 0.3–1.2)
Total Protein: 8.4 g/dL — ABNORMAL HIGH (ref 6.0–8.3)

## 2014-09-01 LAB — LACTIC ACID, PLASMA: LACTIC ACID, VENOUS: 2.2 mmol/L — AB (ref 0.5–2.0)

## 2014-09-01 LAB — CBC WITH DIFFERENTIAL/PLATELET
BASOS ABS: 0.1 10*3/uL (ref 0.0–0.1)
BASOS PCT: 1 % (ref 0–1)
EOS PCT: 1 % (ref 0–5)
Eosinophils Absolute: 0.1 10*3/uL (ref 0.0–0.7)
HEMATOCRIT: 41.8 % (ref 36.0–46.0)
HEMOGLOBIN: 13.9 g/dL (ref 12.0–15.0)
LYMPHS ABS: 2.1 10*3/uL (ref 0.7–4.0)
Lymphocytes Relative: 20 % (ref 12–46)
MCH: 28.4 pg (ref 26.0–34.0)
MCHC: 33.3 g/dL (ref 30.0–36.0)
MCV: 85.3 fL (ref 78.0–100.0)
MONO ABS: 0.5 10*3/uL (ref 0.1–1.0)
MONOS PCT: 5 % (ref 3–12)
Neutro Abs: 7.8 10*3/uL — ABNORMAL HIGH (ref 1.7–7.7)
Neutrophils Relative %: 73 % (ref 43–77)
Platelets: 423 10*3/uL — ABNORMAL HIGH (ref 150–400)
RBC: 4.9 MIL/uL (ref 3.87–5.11)
RDW: 14.9 % (ref 11.5–15.5)
WBC: 10.5 10*3/uL (ref 4.0–10.5)

## 2014-09-01 LAB — PROTIME-INR
INR: 1.07 (ref 0.00–1.49)
Prothrombin Time: 14 seconds (ref 11.6–15.2)

## 2014-09-01 LAB — URINALYSIS, ROUTINE W REFLEX MICROSCOPIC
Bilirubin Urine: NEGATIVE
Glucose, UA: NEGATIVE mg/dL
HGB URINE DIPSTICK: NEGATIVE
KETONES UR: NEGATIVE mg/dL
NITRITE: NEGATIVE
PH: 8.5 — AB (ref 5.0–8.0)
PROTEIN: NEGATIVE mg/dL
SPECIFIC GRAVITY, URINE: 1.024 (ref 1.005–1.030)
UROBILINOGEN UA: 0.2 mg/dL (ref 0.0–1.0)

## 2014-09-01 LAB — LIPASE, BLOOD: Lipase: 23 U/L (ref 11–59)

## 2014-09-01 LAB — URINE MICROSCOPIC-ADD ON

## 2014-09-01 LAB — I-STAT TROPONIN, ED: Troponin i, poc: 0 ng/mL (ref 0.00–0.08)

## 2014-09-01 SURGERY — EGD (ESOPHAGOGASTRODUODENOSCOPY)
Anesthesia: Monitor Anesthesia Care

## 2014-09-01 MED ORDER — LIDOCAINE HCL (CARDIAC) 20 MG/ML IV SOLN
INTRAVENOUS | Status: DC | PRN
  Administered 2014-09-01: 100 mg via INTRAVENOUS

## 2014-09-01 MED ORDER — IOHEXOL 300 MG/ML  SOLN
80.0000 mL | Freq: Once | INTRAMUSCULAR | Status: AC | PRN
  Administered 2014-09-01: 80 mL via INTRAVENOUS

## 2014-09-01 MED ORDER — PANTOPRAZOLE SODIUM 40 MG IV SOLR
40.0000 mg | Freq: Two times a day (BID) | INTRAVENOUS | Status: DC
  Administered 2014-09-01 – 2014-09-06 (×11): 40 mg via INTRAVENOUS
  Filled 2014-09-01 (×12): qty 40

## 2014-09-01 MED ORDER — GLYCOPYRROLATE 0.2 MG/ML IJ SOLN
INTRAMUSCULAR | Status: AC
  Filled 2014-09-01: qty 1

## 2014-09-01 MED ORDER — GLYCOPYRROLATE 0.2 MG/ML IJ SOLN
INTRAMUSCULAR | Status: DC | PRN
  Administered 2014-09-01: 0.2 mg via INTRAVENOUS

## 2014-09-01 MED ORDER — ONDANSETRON HCL 4 MG/2ML IJ SOLN
INTRAMUSCULAR | Status: DC | PRN
  Administered 2014-09-01: 4 mg via INTRAVENOUS

## 2014-09-01 MED ORDER — MORPHINE SULFATE 2 MG/ML IJ SOLN
1.0000 mg | INTRAMUSCULAR | Status: DC | PRN
Start: 2014-09-01 — End: 2014-09-04
  Administered 2014-09-02: 1 mg via INTRAVENOUS
  Administered 2014-09-03 – 2014-09-04 (×3): 2 mg via INTRAVENOUS
  Filled 2014-09-01 (×4): qty 1

## 2014-09-01 MED ORDER — ESMOLOL HCL 10 MG/ML IV SOLN
INTRAVENOUS | Status: DC | PRN
  Administered 2014-09-01: 30 mg via INTRAVENOUS

## 2014-09-01 MED ORDER — ONDANSETRON HCL 4 MG/2ML IJ SOLN
INTRAMUSCULAR | Status: AC
  Filled 2014-09-01: qty 2

## 2014-09-01 MED ORDER — ONDANSETRON HCL 4 MG/2ML IJ SOLN
4.0000 mg | Freq: Once | INTRAMUSCULAR | Status: AC
  Administered 2014-09-01: 4 mg via INTRAVENOUS
  Filled 2014-09-01: qty 2

## 2014-09-01 MED ORDER — ONDANSETRON HCL 4 MG/2ML IJ SOLN: 4.0000 mg | Freq: Four times a day (QID) | INTRAMUSCULAR | Status: DC | PRN

## 2014-09-01 MED ORDER — SODIUM CHLORIDE 0.9 % IV SOLN: INTRAVENOUS | Status: DC

## 2014-09-01 MED ORDER — LABETALOL HCL 5 MG/ML IV SOLN
INTRAVENOUS | Status: DC | PRN
  Administered 2014-09-01: 5 mg via INTRAVENOUS

## 2014-09-01 MED ORDER — SODIUM CHLORIDE 0.9 % IV BOLUS (SEPSIS)
500.0000 mL | Freq: Once | INTRAVENOUS | Status: AC
  Administered 2014-09-01: 500 mL via INTRAVENOUS

## 2014-09-01 MED ORDER — KCL IN DEXTROSE-NACL 20-5-0.45 MEQ/L-%-% IV SOLN
INTRAVENOUS | Status: DC
  Administered 2014-09-01 (×2): via INTRAVENOUS
  Administered 2014-09-02: 100 mL/h via INTRAVENOUS
  Administered 2014-09-02 – 2014-09-03 (×2): via INTRAVENOUS
  Administered 2014-09-03: 100 mL/h via INTRAVENOUS
  Administered 2014-09-03 – 2014-09-05 (×4): via INTRAVENOUS
  Filled 2014-09-01 (×13): qty 1000

## 2014-09-01 MED ORDER — LACTATED RINGERS IV SOLN
INTRAVENOUS | Status: DC | PRN
  Administered 2014-09-01: 14:00:00 via INTRAVENOUS

## 2014-09-01 MED ORDER — METOPROLOL TARTRATE 1 MG/ML IV SOLN
5.0000 mg | Freq: Four times a day (QID) | INTRAVENOUS | Status: DC | PRN
  Administered 2014-09-01: 5 mg via INTRAVENOUS
  Filled 2014-09-01 (×2): qty 5

## 2014-09-01 MED ORDER — KETAMINE HCL 10 MG/ML IJ SOLN: INTRAMUSCULAR | Status: DC | PRN

## 2014-09-01 MED ORDER — LACTATED RINGERS IV SOLN
INTRAVENOUS | Status: DC
  Administered 2014-09-01: 1000 mL via INTRAVENOUS

## 2014-09-01 MED ORDER — LIP MEDEX EX OINT
TOPICAL_OINTMENT | CUTANEOUS | Status: AC
  Administered 2014-09-01: 18:00:00
  Filled 2014-09-01: qty 7

## 2014-09-01 MED ORDER — PROPOFOL INFUSION 10 MG/ML OPTIME
INTRAVENOUS | Status: DC | PRN
  Administered 2014-09-01: 200 ug/kg/min via INTRAVENOUS

## 2014-09-01 MED ORDER — SODIUM CHLORIDE 0.9 % IV BOLUS (SEPSIS): 500.0000 mL | Freq: Once | INTRAVENOUS | Status: DC

## 2014-09-01 MED ORDER — IOHEXOL 300 MG/ML  SOLN
50.0000 mL | Freq: Once | INTRAMUSCULAR | Status: AC | PRN
  Administered 2014-09-01: 50 mL via ORAL

## 2014-09-01 MED ORDER — PROPOFOL 10 MG/ML IV BOLUS
INTRAVENOUS | Status: AC
  Filled 2014-09-01: qty 20

## 2014-09-01 MED ORDER — LIDOCAINE HCL (CARDIAC) 20 MG/ML IV SOLN
INTRAVENOUS | Status: AC
  Filled 2014-09-01: qty 5
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
## 2014-09-01 NOTE — Op Note (Signed)
[place], [id number]   ENDOSCOPY PROCEDURE REPORT  PATIENT: Anna Hoffman, Anna Hoffman  MR#: [id number] BIRTHDATE: 07/19/1935 , 79  yrs. old GENDER: female ENDOSCOPIST: Wilford Corner, MD REFERRED BY: PROCEDURE DATE:  2014-09-02 PROCEDURE:  EGD, diagnostic ASA CLASS:     Class III INDICATIONS:  vomiting and hiatal hernia. MEDICATIONS: Monitored anesthesia care and Per Anesthesia TOPICAL ANESTHETIC: none  DESCRIPTION OF PROCEDURE: After the risks benefits and alternatives of the procedure were thoroughly explained, informed consent was obtained.  The    endoscope was introduced through the mouth and advanced to the second portion of the duodenum , Without limitations.  The instrument was slowly withdrawn as the mucosa was fully examined.    Patchy superficial ulcerations in the esophagus consistent with minimal esophagitis. Small circular superficial ulcer in the proximal stomach likely due to NG trauma. Massive hiatal hernia noted and unable to traverse below the diaphragmatic pinch of this hernia due to excessive looping. Small opening in lumen visible after repeated irrigation. Despite loop reduction unable to advance into the distal stomach due to excessive looping from the hiatal hernia.       Retroflexed views revealed  the large hiatal hernia. Small amount of dark colored fluid and particles in the hiatal hernia. NGT in place.     The scope was then withdrawn from the patient and the procedure completed.  COMPLICATIONS: There were no immediate complications.  ENDOSCOPIC IMPRESSION:     Massive hiatal hernia causing a mechanical obstruction of the distal stomach - see above Small gastric ulcer likely due to NG trauma Esophagitis   RECOMMENDATIONS:     Continue NG drainage; bowel rest; IVFs; Surgery if no improvement   eSigned:  Wilford Corner, MD 09-02-2014 3:12 PM    CC:  CPT CODES: ICD CODES:  The ICD and CPT codes  recommended by this software are interpretations from the data that the clinical staff has captured with the software.  The verification of the translation of this report to the ICD and CPT codes and modifiers is the sole responsibility of the health care institution and practicing physician where this report was generated.  [place]. will not be held responsible for the validity of the ICD and CPT codes included on this report.  AMA assumes no liability for data contained or not contained herein. CPT is a Designer, television/film set of the [employer].  PATIENT NAME:  [name], [name] MR#: [id number]

## 2014-09-01 NOTE — Progress Notes (Signed)
Informed Dr. Johney [place] of hearing S3 and S4 on assessment.  Pt asymptomatic except for tachycardia HR low 100s.  NG in proper place per auscultation.  No orders at this time.  Will continue to monitor.  Pt denies pain.  Sleeping at this time.  Son at bedside.  Iantha Fallen RN 10:59 PM 09/01/2014

## 2014-09-01 NOTE — Anesthesia Preprocedure Evaluation (Signed)
Anesthesia Evaluation  Patient identified by MRN, date of birth, ID band Patient awake    Reviewed: Allergy & Precautions, NPO status , Patient's Chart, lab work & pertinent test results  Airway Mallampati: II  TM Distance: >3 FB Neck ROM: Full    Dental no notable dental hx. (+) Missing   Pulmonary neg pulmonary ROS,  breath sounds clear to auscultation  Pulmonary exam normal       Cardiovascular hypertension, Pt. on medications negative cardio ROS  Rhythm:Regular Rate:Normal     Neuro/Psych negative neurological ROS  negative psych ROS   GI/Hepatic Neg liver ROS, hiatal hernia,   Endo/Other  diabetes, Type 2  Renal/GU negative Renal ROS  negative genitourinary   Musculoskeletal negative musculoskeletal ROS (+)   Abdominal   Peds negative pediatric ROS (+)  Hematology negative hematology ROS (+)   Anesthesia Other Findings   Reproductive/Obstetrics negative OB ROS                             Anesthesia Physical Anesthesia Plan  ASA: III  Anesthesia Plan: General and MAC   Post-op Pain Management:    Induction:   Airway Management Planned: Simple Face Mask and Nasal Cannula  Additional Equipment:   Intra-op Plan:   Post-operative Plan:   Informed Consent: I have reviewed the patients History and Physical, chart, labs and discussed the procedure including the risks, benefits and alternatives for the proposed anesthesia with the patient or authorized representative who has indicated his/her understanding and acceptance.   Dental advisory given  Plan Discussed with: CRNA  Anesthesia Plan Comments:         Anesthesia Quick Evaluation

## 2014-09-01 NOTE — Interval H&P Note (Signed)
History and Physical Interval Note:  09/01/2014 1:59 PM  Anna Hoffman  has presented today for surgery, with the diagnosis of vomiting/hernia  The various methods of treatment have been discussed with the patient and family. After consideration of risks, benefits and other options for treatment, the patient has consented to  Procedure(s): ESOPHAGOGASTRODUODENOSCOPY (EGD) (N/A) as a surgical intervention .  The patient's history has been reviewed, patient examined, no change in status, stable for surgery.  I have reviewed the patient's chart and labs.  Questions were answered to the patient's satisfaction.     [place] C.

## 2014-09-01 NOTE — Transfer of Care (Signed)
Immediate Anesthesia Transfer of Care Note  Patient: Anna Hoffman  Procedure(s) Performed: Procedure(s): ESOPHAGOGASTRODUODENOSCOPY (EGD) (N/A)  Patient Location: PACU  Anesthesia Type:MAC  Level of Consciousness: awake, alert , oriented, patient cooperative and responds to stimulation  Airway & Oxygen Therapy: Patient Spontanous Breathing and Patient connected to nasal cannula oxygen  Post-op Assessment: Report given to RN and Patient moving all extremities X 4  Post vital signs: stable  Last Vitals:  Filed Vitals:   09/01/14 1504  BP: 109/62  Pulse: 141  Temp:   Resp: 24    Complications: No apparent anesthesia complications  Heart rate  140's labetalol given. Dr. Thomes Cake called
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
## 2014-09-01 NOTE — Anesthesia Postprocedure Evaluation (Signed)
  Anesthesia Post-op Note  Patient: Anna Hoffman  Procedure(s) Performed: Procedure(s) (LRB): ESOPHAGOGASTRODUODENOSCOPY (EGD) (N/A)  Patient Location: PACU  Anesthesia Type: MAC  Level of Consciousness: awake and alert   Airway and Oxygen Therapy: Patient Spontanous Breathing  Post-op Pain: mild  Post-op Assessment: Post-op Vital signs reviewed, Patient's Cardiovascular Status Stable, Respiratory Function Stable, Patent Airway and No signs of Nausea or vomiting  Last Vitals:  Filed Vitals:   09/01/14 1351  BP: 146/83  Pulse: 111  Temp: 36.6 C  Resp: 19    Post-op Vital Signs: stable   Complications: No apparent anesthesia complications
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
## 2014-09-01 NOTE — Brief Op Note (Addendum)
Large hiatal hernia and unable to advance below the diaphragmatic pinch due to excessive looping in the hiatal hernia. Small superficial ulcer seen in stomach likely due to NG trauma otherwise no other ulcers seen and no mass seen in the examined stomach. See endopro note for details. Continue bowel rest, NGT. Likely will need surgery to correct this problem. Dr. Penelope Coop available to see again this weekend if needed.
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
## 2014-09-01 NOTE — ED Provider Notes (Signed)
CSN: [id number]     Arrival date & time 08/31/14  2331 History   First MD Initiated Contact with Patient 09/01/14 0022     Chief Complaint  Patient presents with  . Emesis     (Consider location/radiation/quality/duration/timing/severity/associated sxs/prior Treatment) Patient is a 80 y.o. female presenting with vomiting. The history is provided by the patient. No language interpreter was used.  Emesis Severity:  Moderate Duration:  10 hours Timing:  Constant Emesis appearance: Describes emesis as "black". Progression:  Worsening Chronicity:  New Associated symptoms: abdominal pain   Associated symptoms: no chills   Associated symptoms comment:  "upset stomach" since Monday (4 days ago) without fever or significant abdominal pain. She started having vomiting at around 2:00 am this morning which has become more frequent through the day. She has had hot/cold chills today as well. No cough or chest pain. She reports that her last bowel movement was 4 days ago, where she usually has frequent loose stools daily. She continued to pass flatus until today. She denies significant abdominal swelling or distention. No history of gastrointestinal bleeding, ulcer disease or obstruction.    Past Medical History  Diagnosis Date  . Hypertension    Past Surgical History  Procedure Laterality Date  . Abdominal hysterectomy    . Knee surgery     History reviewed. No pertinent family history. History  Substance Use Topics  . Smoking status: Never Smoker   . Smokeless tobacco: Not on file  . Alcohol Use: No   OB History    No data available     Review of Systems  Constitutional: Negative for fever and chills.  HENT: Negative.   Respiratory: Negative.   Cardiovascular: Negative.   Gastrointestinal: Positive for nausea, vomiting, abdominal pain and constipation.  Musculoskeletal: Negative.   Skin: Negative.   Neurological: Negative.       Allergies  Amoxicillin and Sulfa  antibiotics  Home Medications   Prior to Admission medications   Medication Sig Start Date End Date Taking? Authorizing Provider  dextromethorphan (DELSYM) 30 MG/5ML liquid Take 60 mg by mouth 2 (two) times daily as needed for cough.    Historical Provider, MD  fexofenadine (ALLEGRA) 180 MG tablet Take 180 mg by mouth daily.    Historical Provider, MD  ibuprofen (ADVIL,MOTRIN) 200 MG tablet Take 400 mg by mouth every 8 (eight) hours as needed for pain.    Historical Provider, MD  Multiple Vitamins-Minerals (STRESS TAB NF PO) Take 1 tablet by mouth daily.    Historical Provider, MD  Olmesartan-Amlodipine-HCTZ (TRIBENZOR) 20-5-12.5 MG TABS Take 1 tablet by mouth daily.    Historical Provider, MD  oxymetazoline (AFRIN) 0.05 % nasal spray Place 2 sprays into the nose 2 (two) times daily as needed for congestion.    Historical Provider, MD  rosuvastatin (CRESTOR) 10 MG tablet Take 10 mg by mouth daily.    Historical Provider, MD  vitamin B-12 (CYANOCOBALAMIN) 1000 MCG tablet Take 1,000 mcg by mouth daily.    Historical Provider, MD   BP 138/71 mmHg  Pulse 112  Temp(Src) 97.6 F (36.4 C) (Oral)  SpO2 99% Physical Exam  Constitutional: She is oriented to person, place, and time. She appears well-developed and well-nourished.  HENT:  Head: Normocephalic.  Eyes: Conjunctivae are normal.  Neck: Normal range of motion. Neck supple.  Cardiovascular: Regular rhythm.  Tachycardia present.   Pulmonary/Chest: Effort normal and breath sounds normal.  Abdominal: Soft. Bowel sounds are normal. She exhibits no distension. There is no  tenderness. There is no rebound and no guarding.  Musculoskeletal: Normal range of motion.  Neurological: She is alert and oriented to person, place, and time.  Skin: Skin is warm and dry. No rash noted.  Psychiatric: She has a normal mood and affect.    ED Course  Procedures (including critical care time) Labs Review Labs Reviewed  CBG MONITORING, ED - Abnormal;  Notable for the following:    Glucose-Capillary 123 (*)    All other components within normal limits  CBC WITH DIFFERENTIAL/PLATELET  COMPREHENSIVE METABOLIC PANEL  LIPASE, BLOOD  URINALYSIS, ROUTINE W REFLEX MICROSCOPIC  I-STAT TROPOININ, ED  CBG MONITORING, ED    Imaging Review No results found.   EKG Interpretation   Date/Time:  Thursday August 31 2014 23:45:38 EST Ventricular Rate:  113 PR Interval:  167 QRS Duration: 88 QT Interval:  348 QTC Calculation: 477 R Axis:   29 Text Interpretation:  Sinus tachycardia Left atrial enlargement Abnormal  T, consider ischemia, lateral leads Sinus tachycardia Moderate voltage  criteria for LVH, may be normal variant T wave abnormality Abnormal ekg  Confirmed by Carmin Muskrat  MD ([id number]) on 09/01/2014 12:07:59 AM      MDM   Final diagnoses:  None    1. Partial small bowel obstruction  The patient has persistent nausea/vomiting, difficult to manage. CT scan showing large hiatal hernia, ?gastric volvulus. Contrast does not appear to extend through small bowel, question partial obstruction. Discussed with surgery (Dr. Lucia Gaskins) who will see patient in ED. NG tube placed. Tachycardia persists, otherwise, stable vitals. Admit.    Dewaine Oats, PA-C 09/01/14 [place], MD 09/07/14 [phone]

## 2014-09-01 NOTE — ED Notes (Signed)
Floor would not take patient due to elevated BP. MD notified and orders given

## 2014-09-01 NOTE — ED Notes (Signed)
Pt made attempt but was unable to urinate.

## 2014-09-02 DIAGNOSIS — K209 Esophagitis, unspecified without bleeding: Secondary | ICD-10-CM | POA: Diagnosis present

## 2014-09-02 DIAGNOSIS — N2889 Other specified disorders of kidney and ureter: Secondary | ICD-10-CM | POA: Diagnosis present

## 2014-09-02 DIAGNOSIS — K311 Adult hypertrophic pyloric stenosis: Secondary | ICD-10-CM | POA: Diagnosis present

## 2014-09-02 DIAGNOSIS — K440 Diaphragmatic hernia with obstruction, without gangrene: Secondary | ICD-10-CM | POA: Diagnosis present

## 2014-09-02 DIAGNOSIS — I10 Essential (primary) hypertension: Secondary | ICD-10-CM | POA: Diagnosis present

## 2014-09-02 LAB — BASIC METABOLIC PANEL
Anion gap: 7 (ref 5–15)
BUN: 23 mg/dL (ref 6–23)
CHLORIDE: 104 mmol/L (ref 96–112)
CO2: 27 mmol/L (ref 19–32)
CREATININE: 1.13 mg/dL — AB (ref 0.50–1.10)
Calcium: 8.6 mg/dL (ref 8.4–10.5)
GFR calc non Af Amer: 46 mL/min — ABNORMAL LOW (ref >90)
GFR, EST AFRICAN AMERICAN: 53 mL/min — AB (ref >90)
Glucose, Bld: 117 mg/dL — ABNORMAL HIGH (ref 70–99)
POTASSIUM: 3.8 mmol/L (ref 3.5–5.1)
SODIUM: 138 mmol/L (ref 135–145)

## 2014-09-02 LAB — CBC
HCT: 36.1 % (ref 36.0–46.0)
Hemoglobin: 11.6 g/dL — ABNORMAL LOW (ref 12.0–15.0)
MCH: 28.1 pg (ref 26.0–34.0)
MCHC: 32.1 g/dL (ref 30.0–36.0)
MCV: 87.4 fL (ref 78.0–100.0)
Platelets: 340 10*3/uL (ref 150–400)
RBC: 4.13 MIL/uL (ref 3.87–5.11)
RDW: 15.1 % (ref 11.5–15.5)
WBC: 11.5 10*3/uL — ABNORMAL HIGH (ref 4.0–10.5)

## 2014-09-02 MED ORDER — PHENOL 1.4 % MT LIQD
1.0000 | OROMUCOSAL | Status: DC | PRN
  Administered 2014-09-02: 1 via OROMUCOSAL
  Filled 2014-09-02 (×2): qty 177

## 2014-09-02 MED ORDER — CETYLPYRIDINIUM CHLORIDE 0.05 % MT LIQD
7.0000 mL | Freq: Two times a day (BID) | OROMUCOSAL | Status: DC
  Administered 2014-09-02 – 2014-09-03 (×3): 7 mL via OROMUCOSAL

## 2014-09-02 NOTE — Progress Notes (Signed)
Patient ID: Anna Hoffman, female   DOB: [birth year], 80 y.o.   MRN: [id number] 1 Day Post-Op  Subjective: No complaints currently except sore throat and nasal congestion from NG tube. Denies abdominal pain.  Objective: Vital signs in last 24 hours: Temp:  [97.8 F (36.6 C)-98.9 F (37.2 C)] 97.8 F (36.6 C) (01/30 0451) Pulse Rate:  [92-141] 96 (01/30 0451) Resp:  [15-24] 16 (01/30 0451) BP: (104-146)/(56-83) 122/68 mmHg (01/30 0451) SpO2:  [93 %-100 %] 94 % (01/30 0451) Last BM Date: 08/31/14  Intake/Output from previous day: 01/29 0701 - 01/30 0700 In: 3113.5 [I.V.:3083.5; NG/GT:30] Out: 1150 [Urine:1100; Emesis/NG output:50] Intake/Output this shift: Total I/O In: -  Out: 150 [Urine:150]  General appearance: alert, cooperative and no distress GI: normal findings: soft, non-tender and nondistended  Lab Results:   Recent Labs  09/01/14 0008 09/02/14 0500  WBC 10.5 11.5*  HGB 13.9 11.6*  HCT 41.8 36.1  PLT 423* 340   BMET  Recent Labs  09/01/14 0810 09/02/14 0500  NA 140 138  K 3.7 3.8  CL 100 104  CO2 30 27  GLUCOSE 174* 117*  BUN 35* 23  CREATININE 1.38* 1.13*  CALCIUM 9.4 8.6     Studies/Results: Dg Chest 2 View  09/01/2014   CLINICAL DATA:  79 year old female with shortness of breath and chest pain. History of diabetes and hypertension.  EXAM: CHEST  2 VIEW  COMPARISON:  Chest x-ray 08/08/2014.  FINDINGS: Large hiatal hernia. Nasogastric tube in position with tip in the intrathoracic portion of the stomach. Lung volumes are low. Bibasilar opacities favored to reflect some subsegmental atelectasis. No acute consolidative airspace disease. No definite pleural effusions. No evidence of pulmonary edema. Heart size is normal. The patient is rotated to the left on today's exam, resulting in distortion of the mediastinal contours and reduced diagnostic sensitivity and specificity for mediastinal pathology.  IMPRESSION: 1. Low lung volumes without radiographic  evidence of acute cardiopulmonary disease. 2. Large hiatal hernia demonstrated.   Electronically Signed   By: Vinnie Langton M.D.   On: 09/01/2014 18:03   Ct Abdomen Pelvis W Contrast  09/01/2014   CLINICAL DATA:  No bowel movement for 3 days. Vomiting Black stuff for 5 days. White cell count 10.5. Diabetes and hypertension.  EXAM: CT ABDOMEN AND PELVIS WITH CONTRAST  TECHNIQUE: Multidetector CT imaging of the abdomen and pelvis was performed using the standard protocol following bolus administration of intravenous contrast.  CONTRAST:  54mL OMNIPAQUE IOHEXOL 300 MG/ML  SOLN  COMPARISON:  MRI abdomen 05/28/2005.  FINDINGS: Mild atelectasis in the left lung base. Large esophageal hiatal hernia containing most of the stomach. Contrast material is seen distally in the small bowel suggesting no evidence of complete obstruction although gastric volvulus is not excluded.  The liver, spleen, gallbladder, pancreas, adrenal glands, inferior vena cava, and retroperitoneal lymph nodes are unremarkable. Calcification of the abdominal aorta without aneurysm or dissection. There is a heterogeneous mass arising from the anterior upper pole of the left kidney measuring 2.7 x 3.1 x 3.9 cm. The mass contains some fat. Renal cell carcinoma should be excluded. Additional small cysts in the left kidney. Right kidney is somewhat atrophic. No hydronephrosis in either kidney. Small bowel are mostly decompressed. Stool-filled colon without abnormal distention. No free air or free fluid in the abdomen. Abdominal wall musculature appears intact.  Pelvis: The appendix is not identified. No free or loculated pelvic fluid collections. No pelvic mass or lymphadenopathy. Uterus is surgically absent. Bladder  wall is not thickened. Stool-filled rectosigmoid colon with scattered diverticula. No inflammatory changes. 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 Degenerative changes in the lumbar spine. No destructive bone lesions.  IMPRESSION:  Large esophageal hiatal hernia containing most of the stomach. Gastric volvulus not excluded. Heterogeneous solid mass in the upper pole left kidney containing fat. Renal cell carcinoma should be excluded.   Electronically Signed   By: Lucienne Capers M.D.   On: 09/01/2014 02:49    Anti-infectives: Anti-infectives    None      Assessment/Plan:  s/p Procedure(s): ESOPHAGOGASTRODUODENOSCOPY (EGD) Large hiatal hernia with gastric outlet obstruction. Symptoms currently relieved with NG but at time of endoscopy scope could not traverse the distal stomach at the hernia site. Findings discussed with patient and her son. She will require repair this hospitalization. They are in complete agreement. I discussed laparoscopic repair of hiatal hernia including the indications and nature of the surgery and recovery as well as risks of anesthetic complications, bleeding, infection and gastric or esophageal injury. We will schedule for Monday.    LOS: 2 days    Nevaen Tredway T 09/02/2014

## 2014-09-03 ENCOUNTER — Inpatient Hospital Stay (HOSPITAL_COMMUNITY): Payer: Medicare HMO

## 2014-09-03 LAB — MRSA PCR SCREENING: MRSA by PCR: NEGATIVE

## 2014-09-03 LAB — GLUCOSE, CAPILLARY
Glucose-Capillary: 98 mg/dL (ref 70–99)
Glucose-Capillary: 104 mg/dL — ABNORMAL HIGH (ref 70–99)
Glucose-Capillary: 115 mg/dL — ABNORMAL HIGH (ref 70–99)

## 2014-09-03 MED ORDER — CIPROFLOXACIN IN D5W 400 MG/200ML IV SOLN
400.0000 mg | Freq: Once | INTRAVENOUS | Status: DC
  Filled 2014-09-03: qty 200

## 2014-09-03 NOTE — Anesthesia Postprocedure Evaluation (Signed)
  Anesthesia Post-op Note  Patient: Anna Hoffman  Procedure(s) Performed: Procedure(s) (LRB): ESOPHAGOGASTRODUODENOSCOPY (EGD) (N/A)  Patient Location: PACU  Anesthesia Type: MAC  Level of Consciousness: awake and alert   Airway and Oxygen Therapy: Patient Spontanous Breathing  Post-op Pain: mild  Post-op Assessment: Post-op Vital signs reviewed, Patient's Cardiovascular Status Stable, Respiratory Function Stable, Patent Airway and No signs of Nausea or vomiting  Last Vitals:  Filed Vitals:   09/03/14 0553  BP: 132/71  Pulse: 94  Temp: 37.6 C  Resp: 18    Post-op Vital Signs: stable   Complications: No apparent anesthesia complications

## 2014-09-03 NOTE — Progress Notes (Signed)
Patient ID: Anna Hoffman, female   DOB: 02/22/1935, 80 y.o.   MRN: [id number] 2 Days Post-Op  Subjective: Some occasional crampy epigastric pain but none currently. No new complaints.  Objective: Vital signs in last 24 hours: Temp:  [98.5 F (36.9 C)-99.6 F (37.6 C)] 99.6 F (37.6 C) (01/31 0553) Pulse Rate:  [94-101] 94 (01/31 0553) Resp:  [16-18] 18 (01/31 0553) BP: (132-154)/(71-89) 132/71 mmHg (01/31 0553) SpO2:  [96 %-98 %] 98 % (01/31 0553) Last BM Date: 08/31/14  Intake/Output from previous day: 01/30 0701 - 01/31 0700 In: 2430 [I.V.:2400; NG/GT:30] Out: 1800 [Urine:1400; Emesis/NG output:400] Intake/Output this shift: Total I/O In: 0  Out: 400 [Urine:400]  General appearance: alert, cooperative and no distress GI: normal findings: soft, non-tender  Lab Results:   Recent Labs  09/01/14 0008 09/02/14 0500  WBC 10.5 11.5*  HGB 13.9 11.6*  HCT 41.8 36.1  PLT 423* 340   BMET  Recent Labs  09/01/14 0810 09/02/14 0500  NA 140 138  K 3.7 3.8  CL 100 104  CO2 30 27  GLUCOSE 174* 117*  BUN 35* 23  CREATININE 1.38* 1.13*  CALCIUM 9.4 8.6     Studies/Results: Dg Chest 2 View  09/01/2014   CLINICAL DATA:  80 year old female with shortness of breath and chest pain. History of diabetes and hypertension.  EXAM: CHEST  2 VIEW  COMPARISON:  Chest x-ray 08/08/2014.  FINDINGS: Large hiatal hernia. Nasogastric tube in position with tip in the intrathoracic portion of the stomach. Lung volumes are low. Bibasilar opacities favored to reflect some subsegmental atelectasis. No acute consolidative airspace disease. No definite pleural effusions. No evidence of pulmonary edema. Heart size is normal. The patient is rotated to the left on today's exam, resulting in distortion of the mediastinal contours and reduced diagnostic sensitivity and specificity for mediastinal pathology.  IMPRESSION: 1. Low lung volumes without radiographic evidence of acute cardiopulmonary disease. 2.  Large hiatal hernia demonstrated.   Electronically Signed   By: Vinnie Langton M.D.   On: 09/01/2014 18:03    Anti-infectives: Anti-infectives    None      Assessment/Plan:  Large hiatal hernia with gastric outlet obstruction and chronic reflux s/p Procedure(s): ESOPHAGOGASTRODUODENOSCOPY (EGD) For laparoscopic repair with Nissen fundoplication tomorrow. We again discussed the procedure in detail including its nature and expected recovery and indications as well as risks of anesthetic complications, bleeding, infection, possible need for open surgery, slight risk of injury to the stomach or esophagus or other structures and possible use of postoperative gastrostomy tube. She understands and agrees and all her questions answered.    LOS: 3 days    Stefhanie Kachmar T 09/03/2014

## 2014-09-04 ENCOUNTER — Inpatient Hospital Stay (HOSPITAL_COMMUNITY): Payer: Medicare HMO | Admitting: Certified Registered Nurse Anesthetist

## 2014-09-04 ENCOUNTER — Encounter (HOSPITAL_COMMUNITY): Payer: Self-pay | Admitting: Gastroenterology

## 2014-09-04 ENCOUNTER — Encounter (HOSPITAL_COMMUNITY)
Admission: EM | Disposition: A | Discharge status: Home / Self Care | Payer: Self-pay | Source: Home / Self Care | Attending: General Surgery

## 2014-09-04 HISTORY — PX: HIATAL HERNIA REPAIR: SHX195

## 2014-09-04 LAB — GLUCOSE, CAPILLARY
GLUCOSE-CAPILLARY: 159 mg/dL — AB (ref 70–99)
Glucose-Capillary: 129 mg/dL — ABNORMAL HIGH (ref 70–99)
Glucose-Capillary: 146 mg/dL — ABNORMAL HIGH (ref 70–99)
Glucose-Capillary: 171 mg/dL — ABNORMAL HIGH (ref 70–99)

## 2014-09-04 SURGERY — REPAIR, HERNIA, HIATAL, LAPAROSCOPIC
Anesthesia: General

## 2014-09-04 MED ORDER — LACTATED RINGERS IV SOLN
INTRAVENOUS | Status: DC
  Administered 2014-09-04: 16:00:00 via INTRAVENOUS

## 2014-09-04 MED ORDER — PROPOFOL 10 MG/ML IV BOLUS
INTRAVENOUS | Status: DC | PRN
  Administered 2014-09-04: 140 mg via INTRAVENOUS

## 2014-09-04 MED ORDER — EPHEDRINE SULFATE 50 MG/ML IJ SOLN
INTRAMUSCULAR | Status: DC | PRN
  Administered 2014-09-04: 10 mg via INTRAVENOUS

## 2014-09-04 MED ORDER — TISSEEL VH 10 ML EX KIT
PACK | CUTANEOUS | Status: AC
  Filled 2014-09-04: qty 1

## 2014-09-04 MED ORDER — FENTANYL CITRATE 0.05 MG/ML IJ SOLN
INTRAMUSCULAR | Status: DC | PRN
Start: 2014-09-04 — End: 2014-09-04
  Administered 2014-09-04 (×6): 50 ug via INTRAVENOUS

## 2014-09-04 MED ORDER — FENTANYL CITRATE 0.05 MG/ML IJ SOLN: 25.0000 ug | INTRAMUSCULAR | Status: DC | PRN

## 2014-09-04 MED ORDER — CHLORHEXIDINE GLUCONATE 0.12 % MT SOLN
15.0000 mL | Freq: Two times a day (BID) | OROMUCOSAL | Status: DC
  Administered 2014-09-04 – 2014-09-05 (×3): 15 mL via OROMUCOSAL
  Filled 2014-09-04 (×4): qty 15

## 2014-09-04 MED ORDER — DEXAMETHASONE SODIUM PHOSPHATE 10 MG/ML IJ SOLN
INTRAMUSCULAR | Status: DC | PRN
  Administered 2014-09-04: 10 mg via INTRAVENOUS

## 2014-09-04 MED ORDER — MEPERIDINE HCL 50 MG/ML IJ SOLN: 6.2500 mg | INTRAMUSCULAR | Status: DC | PRN

## 2014-09-04 MED ORDER — CEFAZOLIN SODIUM-DEXTROSE 2-3 GM-% IV SOLR
INTRAVENOUS | Status: AC
  Filled 2014-09-04: qty 50

## 2014-09-04 MED ORDER — 0.9 % SODIUM CHLORIDE (POUR BTL) OPTIME
TOPICAL | Status: DC | PRN
  Administered 2014-09-04: 1000 mL

## 2014-09-04 MED ORDER — CEFAZOLIN SODIUM-DEXTROSE 2-3 GM-% IV SOLR
INTRAVENOUS | Status: DC | PRN
  Administered 2014-09-04: 2 g via INTRAVENOUS

## 2014-09-04 MED ORDER — GLYCOPYRROLATE 0.2 MG/ML IJ SOLN
INTRAMUSCULAR | Status: DC | PRN
  Administered 2014-09-04: .6 mg via INTRAVENOUS

## 2014-09-04 MED ORDER — FENTANYL CITRATE 0.05 MG/ML IJ SOLN
INTRAMUSCULAR | Status: AC
Start: 2014-09-04 — End: 2014-09-04
  Filled 2014-09-04: qty 5

## 2014-09-04 MED ORDER — BUPIVACAINE-EPINEPHRINE 0.25% -1:200000 IJ SOLN
INTRAMUSCULAR | Status: AC
  Filled 2014-09-04: qty 1

## 2014-09-04 MED ORDER — TISSEEL VH 10 ML EX KIT
PACK | CUTANEOUS | Status: DC | PRN
  Administered 2014-09-04: 1

## 2014-09-04 MED ORDER — ROCURONIUM BROMIDE 100 MG/10ML IV SOLN
INTRAVENOUS | Status: DC | PRN
  Administered 2014-09-04: 40 mg via INTRAVENOUS
  Administered 2014-09-04: 10 mg via INTRAVENOUS

## 2014-09-04 MED ORDER — PROPOFOL 10 MG/ML IV BOLUS
INTRAVENOUS | Status: AC
Start: 2014-09-04 — End: 2014-09-04
  Filled 2014-09-04: qty 20

## 2014-09-04 MED ORDER — LACTATED RINGERS IV SOLN
INTRAVENOUS | Status: DC | PRN
  Administered 2014-09-04: 1000 mL via INTRAVENOUS

## 2014-09-04 MED ORDER — ONDANSETRON HCL 4 MG/2ML IJ SOLN
INTRAMUSCULAR | Status: AC
  Filled 2014-09-04: qty 2

## 2014-09-04 MED ORDER — MORPHINE SULFATE 2 MG/ML IJ SOLN
2.0000 mg | INTRAMUSCULAR | Status: DC | PRN
  Administered 2014-09-04 – 2014-09-05 (×5): 2 mg via INTRAVENOUS
  Filled 2014-09-04 (×5): qty 1

## 2014-09-04 MED ORDER — ONDANSETRON HCL 4 MG/2ML IJ SOLN
INTRAMUSCULAR | Status: DC | PRN
  Administered 2014-09-04: 4 mg via INTRAVENOUS

## 2014-09-04 MED ORDER — PROMETHAZINE HCL 25 MG/ML IJ SOLN: 6.2500 mg | INTRAMUSCULAR | Status: DC | PRN

## 2014-09-04 MED ORDER — FENTANYL CITRATE 0.05 MG/ML IJ SOLN
INTRAMUSCULAR | Status: AC
  Filled 2014-09-04: qty 2

## 2014-09-04 MED ORDER — CETYLPYRIDINIUM CHLORIDE 0.05 % MT LIQD: 7.0000 mL | Freq: Two times a day (BID) | OROMUCOSAL | Status: DC

## 2014-09-04 MED ORDER — LACTATED RINGERS IV SOLN
INTRAVENOUS | Status: DC | PRN
  Administered 2014-09-04 (×2): via INTRAVENOUS

## 2014-09-04 MED ORDER — LIDOCAINE HCL (CARDIAC) 20 MG/ML IV SOLN
INTRAVENOUS | Status: DC | PRN
  Administered 2014-09-04: 100 mg via INTRAVENOUS

## 2014-09-04 MED ORDER — NEOSTIGMINE METHYLSULFATE 10 MG/10ML IV SOLN
INTRAVENOUS | Status: DC | PRN
  Administered 2014-09-04: 4 mg via INTRAVENOUS

## 2014-09-04 MED ORDER — ESMOLOL HCL 10 MG/ML IV SOLN
INTRAVENOUS | Status: DC | PRN
  Administered 2014-09-04: 10 mg via INTRAVENOUS
  Administered 2014-09-04: 20 mg via INTRAVENOUS

## 2014-09-04 MED ORDER — PHENYLEPHRINE HCL 10 MG/ML IJ SOLN
INTRAMUSCULAR | Status: DC | PRN
  Administered 2014-09-04 (×4): 80 ug via INTRAVENOUS

## 2014-09-04 MED ORDER — INSULIN ASPART 100 UNIT/ML ~~LOC~~ SOLN
0.0000 [IU] | Freq: Three times a day (TID) | SUBCUTANEOUS | Status: DC
  Administered 2014-09-05: 1 [IU] via SUBCUTANEOUS

## 2014-09-04 MED ORDER — SUCCINYLCHOLINE CHLORIDE 20 MG/ML IJ SOLN
INTRAMUSCULAR | Status: DC | PRN
  Administered 2014-09-04: 100 mg via INTRAVENOUS

## 2014-09-04 MED ORDER — LIDOCAINE HCL (CARDIAC) 20 MG/ML IV SOLN
INTRAVENOUS | Status: AC
  Filled 2014-09-04: qty 5

## 2014-09-04 MED ORDER — ROCURONIUM BROMIDE 100 MG/10ML IV SOLN
INTRAVENOUS | Status: AC
  Filled 2014-09-04: qty 1

## 2014-09-04 MED ORDER — ESMOLOL HCL 10 MG/ML IV SOLN
INTRAVENOUS | Status: AC
  Filled 2014-09-04: qty 10

## 2014-09-04 MED ORDER — BUPIVACAINE-EPINEPHRINE 0.25% -1:200000 IJ SOLN
INTRAMUSCULAR | Status: DC | PRN
  Administered 2014-09-04: 23 mL

## 2014-09-04 MED ORDER — DEXAMETHASONE SODIUM PHOSPHATE 10 MG/ML IJ SOLN
INTRAMUSCULAR | Status: AC
  Filled 2014-09-04: qty 1

## 2014-09-04 SURGICAL SUPPLY — 48 items
APL SRG 32X5 SNPLK LF DISP (MISCELLANEOUS) ×1
APPLIER CLIP ROT 10 11.4 M/L (STAPLE)
APR CLP MED LRG 11.4X10 (STAPLE)
CLAMP ENDO BABCK 10MM (STAPLE) ×2 IMPLANT
CLIP APPLIE ROT 10 11.4 M/L (STAPLE) ×1 IMPLANT
DECANTER SPIKE VIAL GLASS SM (MISCELLANEOUS) ×3 IMPLANT
DEVICE SUT QUICK LOAD TK 5 (STAPLE) ×7 IMPLANT
DEVICE SUT TI-KNOT TK 5X26 (MISCELLANEOUS) ×1 IMPLANT
DEVICE TI KNOT TK5 (MISCELLANEOUS) ×1
DISSECTOR BLUNT TIP ENDO 5MM (MISCELLANEOUS) ×1 IMPLANT
DRAIN PENROSE 18X1/2 LTX STRL (DRAIN) ×3 IMPLANT
DRAPE LAPAROSCOPIC ABDOMINAL (DRAPES) ×3 IMPLANT
ELECT REM PT RETURN 9FT ADLT (ELECTROSURGICAL) ×3
ELECTRODE REM PT RTRN 9FT ADLT (ELECTROSURGICAL) ×1 IMPLANT
FELT TEFLON 4 X1 (Mesh General) IMPLANT
GLOVE BIOGEL PI IND STRL 7.5 (GLOVE) ×1 IMPLANT
GLOVE BIOGEL PI INDICATOR 7.5 (GLOVE) ×2
GLOVE ECLIPSE 7.5 STRL STRAW (GLOVE) ×3 IMPLANT
GOWN STRL REUS W/TWL LRG LVL3 (GOWN DISPOSABLE) ×1 IMPLANT
GOWN STRL REUS W/TWL XL LVL3 (GOWN DISPOSABLE) ×12 IMPLANT
GRASPER ENDO BABCOCK 10 (MISCELLANEOUS) IMPLANT
GRASPER ENDO BABCOCK 10MM (MISCELLANEOUS)
KIT BASIN OR (CUSTOM PROCEDURE TRAY) ×3 IMPLANT
LIQUID BAND (GAUZE/BANDAGES/DRESSINGS) ×2 IMPLANT
MESH HERNIA 7X10 (Mesh General) ×2 IMPLANT
NS IRRIG 1000ML POUR BTL (IV SOLUTION) ×3 IMPLANT
QUICK LOAD TK 5 (STAPLE) ×7
SEALANT SURGICAL APPL DUAL CAN (MISCELLANEOUS) ×2 IMPLANT
SET IRRIG TUBING LAPAROSCOPIC (IRRIGATION / IRRIGATOR) ×3 IMPLANT
SHEARS HARMONIC ACE PLUS 36CM (ENDOMECHANICALS) ×3 IMPLANT
SLEEVE XCEL OPT CAN 5 100 (ENDOMECHANICALS) ×6 IMPLANT
SOLUTION ANTI FOG 6CC (MISCELLANEOUS) ×1 IMPLANT
STAPLER VISISTAT 35W (STAPLE) ×1 IMPLANT
SUT DEVICE BRAIDED 0X39 (SUTURE) ×13 IMPLANT
SUT DVC VICRYL PGA 2.0X39 (SUTURE) ×8 IMPLANT
SUT MNCRL AB 4-0 PS2 18 (SUTURE) ×3 IMPLANT
TIP INNERVISION DETACH 40FR (MISCELLANEOUS) IMPLANT
TIP INNERVISION DETACH 50FR (MISCELLANEOUS) IMPLANT
TIP INNERVISION DETACH 56FR (MISCELLANEOUS) IMPLANT
TIPS INNERVISION DETACH 40FR (MISCELLANEOUS)
TOWEL OR 17X26 10 PK STRL BLUE (TOWEL DISPOSABLE) ×3 IMPLANT
TRAY FOLEY CATH 14FRSI W/METER (CATHETERS) ×3 IMPLANT
TRAY LAPAROSCOPIC (CUSTOM PROCEDURE TRAY) ×3 IMPLANT
TROCAR BLADELESS OPT 5 100 (ENDOMECHANICALS) ×3 IMPLANT
TROCAR XCEL 12X100 BLDLESS (ENDOMECHANICALS) ×2 IMPLANT
TROCAR XCEL NON-BLD 11X100MML (ENDOMECHANICALS) ×2 IMPLANT
TROCAR XCEL UNIV SLVE 11M 100M (ENDOMECHANICALS) ×4 IMPLANT
TUBING INSUFFLATION 10FT LAP (TUBING) ×1 IMPLANT

## 2014-09-04 NOTE — Transfer of Care (Signed)
Immediate Anesthesia Transfer of Care Note  Patient: Anna Hoffman  Procedure(s) Performed: Procedure(s) (LRB): LAPAROSCOPIC REPAIR OF HIATAL HERNIA (N/A)  Patient Location: PACU  Anesthesia Type: General  Level of Consciousness: sedated, patient cooperative and responds to stimulation  Airway & Oxygen Therapy: Patient Spontanous Breathing and Patient connected to face mask oxgen  Post-op Assessment: Report given to PACU RN and Post -op Vital signs reviewed and stable  Post vital signs: Reviewed and stable  Complications: No apparent anesthesia complications

## 2014-09-04 NOTE — Progress Notes (Signed)
Pt's NGT tube looked like it had been pulled out of position.  There was only clear phlem looking material being suctioned out in canister. I advanced the tube a little ways, but the liquid being pulled out looked the same.  I notified Dr. Johney Maine and he  advised me to insert the tube past one of the black markings, and then order a KUB to check placement. Will continue to monitor.

## 2014-09-04 NOTE — Progress Notes (Signed)
KUB showed that the NGT was not in the stomach. Another nurse and I went to pt's room to access the tube, and at that time is was curled up in her mouth. We pulled back on the tube just enough to uncurl it out of the mouth, and it slipped out of her nose. We then inserted a new tube, and then I and another nurse verified placement. Notified MD of our actions. MD then stated that we weren' Wit to do anything else to the NG. Will continue to monitor.

## 2014-09-04 NOTE — Anesthesia Preprocedure Evaluation (Signed)
Anesthesia Evaluation  Patient identified by MRN, date of birth, ID band Patient awake    Reviewed: Allergy & Precautions, NPO status , Patient's Chart, lab work & pertinent test results  Airway Mallampati: II   Neck ROM: Full    Dental  (+) Teeth Intact, Missing   Pulmonary neg pulmonary ROS,  breath sounds clear to auscultation        Cardiovascular hypertension, Rhythm:Regular Rate:Normal     Neuro/Psych  Neuromuscular disease    GI/Hepatic hiatal hernia, GERD-  ,  Endo/Other  negative endocrine ROS  Renal/GU negative Renal ROS     Musculoskeletal negative musculoskeletal ROS (+)   Abdominal   Peds  Hematology   Anesthesia Other Findings   Reproductive/Obstetrics                             Anesthesia Physical Anesthesia Plan  ASA: II  Anesthesia Plan: General   Post-op Pain Management:    Induction: Intravenous  Airway Management Planned: Oral ETT  Additional Equipment:   Intra-op Plan:   Post-operative Plan: Extubation in OR  Informed Consent: I have reviewed the patients History and Physical, chart, labs and discussed the procedure including the risks, benefits and alternatives for the proposed anesthesia with the patient or authorized representative who has indicated his/her understanding and acceptance.   Dental advisory given  Plan Discussed with:   Anesthesia Plan Comments:         Anesthesia Quick Evaluation

## 2014-09-04 NOTE — Anesthesia Procedure Notes (Signed)
Procedure Name: Intubation Date/Time: 09/04/2014 12:48 PM Performed by: Maxwell Caul Pre-anesthesia Checklist: Patient identified, Emergency Drugs available, Suction available and Patient being monitored Patient Re-evaluated:Patient Re-evaluated prior to inductionOxygen Delivery Method: Circle system utilized Preoxygenation: Pre-oxygenation with 100% oxygen Intubation Type: IV induction, Rapid sequence and Cricoid Pressure applied Laryngoscope Size: Mac and 4 Grade View: Grade I Tube type: Oral Tube size: 7.5 mm Number of attempts: 1 Placement Confirmation: ETT inserted through vocal cords under direct vision,  positive ETCO2,  CO2 detector and breath sounds checked- equal and bilateral Secured at: 21 cm Tube secured with: Tape Dental Injury: Teeth and Oropharynx as per pre-operative assessment  Comments: NGT to suction. Oral suction available for intubation if needed.

## 2014-09-04 NOTE — Op Note (Signed)
Preoperative Diagnosis: Giant hiatal hernia with gastric outlet obstruction and chronic GERD  Postoprative Diagnosis: Same  Procedure: Procedure(s): LAPAROSCOPIC REPAIR OF GIANT HIATAL HERNIA, Insertion of biologic mesh, With Nissen fundoplication   Surgeon: Excell Seltzer T   Assistants: Alphonsa Overall M.D.  Anesthesia:  General endotracheal anesthesia  Indications: Patient is a 80 year old female who presents with epigastric pain and vomiting and has been found to have gastric outlet obstruction with a large hiatal hernia and essentially the entire stomach above the diaphragm. She underwent endoscopy showing obstruction and an NG was placed with relief of her initial symptoms. She also has long-standing reflux. We have recommended proceeding with urgent laparoscopic repair of her giant hiatal hernia with Nissen fundoplication. I discussed the indications and nature of the procedure with the patient and her family as well as risks of anesthetic complications, bleeding, infection, possible need for open surgery, risk of injury to the stomach or esophagus and side effects of dysphagia and gas bloat. They expressed understanding and desire to proceed.    Procedure Detail:  Patient was brought to the operating room, placed in the supine position on the operating table and general endotracheal anesthesia induced. She received preoperative IV antibiotics. PAS were in place. The abdomen was widely sterilely prepped and draped. Patient timeout was performed and correct procedure verified. Access was obtained with a 5 mm Optiview trocar in the left upper quadrant without difficulty and pneumoperitoneum established. There was no evidence of trocar injury. Under direct vision a 5 mm trocar was placed laterally in the right upper quadrant, a 12 mm trocar in the right upper quadrant midclavicular line, a 5 mm trocar just above into the left of the umbilicus for the camera port. Through a 5 mm subxiphoid site  the [facility] retractor was placed in the left lobe of the liver elevated with excellent exposure of the stomach and hiatus. Finally an additional 5 mm trocar was placed laterally in the left upper quadrant. There was a large defect in the hiatus probably 7 or 8 cm with the entire stomach above. The stomach and omentum were able to be reduced without much difficulty with gentle traction. The gastrohepatic ligament was divided with the Harmonic scalpel. Beginning along the anterior border of the right crus the hernia sac was incised and dissection was carried up over the top of the hiatus and down the left crus. A large hernia sac was stripped down out of the mediastinum and completely reduced. The esophagus was identified and carefully protected. Both crura were dissected posteriorly. I then beginning at the mid fundus divided short gastrics and entered the lesser sac with the Harmonic scalpel. The dissection was continued up along the fundus working proximally dividing short gastric vessels. Some further attachments of the cardia down around the base of the crura were carefully divided and the distal esophagus completely mobilized away from the crura. The esophagus was further dissected up into the mediastinum and there was plenty of length and after dissection several centimeters of esophagus lay in the abdominal cavity without any traction. A crural repair was then performed with interrupted 0 Ethibond sutures beginning posteriorly and working anteriorly until the hiatus was closed down to a normal size. The tissue was of moderate quality. I elected to use a soft tissue patch and a Lacinda Axon Biopatch was used, increasing the slot somewhat of the patch and using it with the opened and just to the right of the esophagus. It was oriented and placed over the repair and  diaphragm and then sutured to the diaphragm with interrupted 3-0 Vicryl sutures and further affixed with Tisseel with nice smooth deployment over the  posterior repair and the surrounding diaphragm. Following this a mobile portion of fundus was chosen for the wrap and brought behind the esophagus and the contiguous portion of upper fundus identified for the wrap. A 56 ligated bougie dilator was passed orally into the stomach without difficulty. A 360 wrap was created around the lower esophagus with interrupted 2-0 Ethibond sutures incorporating small bites of the anterior esophagus into the wrap. A 3 cm 3 suture wrap was performed. The dilator was removed and the wrap was seen to be nice and floppy. The abdomen was inspected for hemostasis or any evidence of injury and everything looked fine. The Nathanson retractor was removed under direct vision. All CO2 was evacuated and trochars removed. Skin incisions were closed with subcutaneous testicular Monocryl and Dermabond.    Findings: As above  Estimated Blood Loss:  Minimal         Drains: none  Blood Given: none          Specimens: None        Complications:  * No complications entered in OR log *         Disposition: PACU - hemodynamically stable.         Condition: stable

## 2014-09-04 NOTE — Anesthesia Postprocedure Evaluation (Signed)
  Anesthesia Post-op Note  Patient: Anna Hoffman  Procedure(s) Performed: Procedure(s): LAPAROSCOPIC REPAIR OF HIATAL HERNIA (N/A)  Patient Location: PACU  Anesthesia Type:General  Level of Consciousness: awake and alert   Airway and Oxygen Therapy: Patient Spontanous Breathing  Post-op Pain: mild  Post-op Assessment: Post-op Vital signs reviewed  Post-op Vital Signs: stable  Last Vitals:  Filed Vitals:   09/04/14 1631  BP: 174/91  Pulse: 107  Temp: 36.5 C  Resp: 12    Complications: No apparent anesthesia complications

## 2014-09-05 ENCOUNTER — Encounter (HOSPITAL_COMMUNITY): Payer: Self-pay | Admitting: General Surgery

## 2014-09-05 LAB — CBC
HEMATOCRIT: 35.7 % — AB (ref 36.0–46.0)
HEMOGLOBIN: 11.2 g/dL — AB (ref 12.0–15.0)
MCH: 26.9 pg (ref 26.0–34.0)
MCHC: 31.4 g/dL (ref 30.0–36.0)
MCV: 85.8 fL (ref 78.0–100.0)
PLATELETS: 306 10*3/uL (ref 150–400)
RBC: 4.16 MIL/uL (ref 3.87–5.11)
RDW: 14.4 % (ref 11.5–15.5)
WBC: 13.4 10*3/uL — ABNORMAL HIGH (ref 4.0–10.5)

## 2014-09-05 LAB — BASIC METABOLIC PANEL
Anion gap: 7 (ref 5–15)
BUN: 12 mg/dL (ref 6–23)
CALCIUM: 8.7 mg/dL (ref 8.4–10.5)
CHLORIDE: 104 mmol/L (ref 96–112)
CO2: 24 mmol/L (ref 19–32)
CREATININE: 1.03 mg/dL (ref 0.50–1.10)
GFR, EST AFRICAN AMERICAN: 59 mL/min — AB (ref >90)
GFR, EST NON AFRICAN AMERICAN: 51 mL/min — AB (ref >90)
Glucose, Bld: 156 mg/dL — ABNORMAL HIGH (ref 70–99)
Potassium: 4.5 mmol/L (ref 3.5–5.1)
SODIUM: 135 mmol/L (ref 135–145)

## 2014-09-05 LAB — GLUCOSE, CAPILLARY
Glucose-Capillary: 80 mg/dL (ref 70–99)
Glucose-Capillary: 125 mg/dL — ABNORMAL HIGH (ref 70–99)
Glucose-Capillary: 84 mg/dL (ref 70–99)
Glucose-Capillary: 79 mg/dL (ref 70–99)
Glucose-Capillary: 110 mg/dL — ABNORMAL HIGH (ref 70–99)

## 2014-09-05 NOTE — Progress Notes (Signed)
Patient ID: Anna Hoffman, female   DOB: 1935-03-20, 80 y.o.   MRN: [id number] 1 Day Post-Op  Subjective: Feels pretty well.  Some left shoulder pain and abd soreness not severe. Denies nausea  Objective: Vital signs in last 24 hours: Temp:  [97.5 F (36.4 C)-98.7 F (37.1 C)] 98.7 F (37.1 C) (02/02 0501) Pulse Rate:  [102-118] 102 (02/02 0501) Resp:  [12-14] 14 (02/02 0501) BP: (136-174)/(73-91) 143/77 mmHg (02/02 0501) SpO2:  [92 %-100 %] 96 % (02/02 0501) Last BM Date: 08/30/14  Intake/Output from previous day: 02/01 0701 - 02/02 0700 In: 5070 [I.V.:5070] Out: 3400 [Urine:3400] Intake/Output this shift: Total I/O In: 395 [I.V.:395] Out: -   General appearance: alert, cooperative and no distress Resp: No wheezing or increased WOB GI: normal findings: soft, non-tender and non distended Incision/Wound: Clean and dry  Lab Results:   Recent Labs  09/05/14 0510  WBC 13.4*  HGB 11.2*  HCT 35.7*  PLT 306   BMET  Recent Labs  09/05/14 0510  NA 135  K 4.5  CL 104  CO2 24  GLUCOSE 156*  BUN 12  CREATININE 1.03  CALCIUM 8.7     Studies/Results: Dg Abd 1 View  09/03/2014   CLINICAL DATA:  Confirm nasogastric tube placement.  EXAM: ABDOMEN - 1 VIEW  COMPARISON:  CT abdomen chest radiographs 09/01/2014  FINDINGS: An enteric tube is not seen within the abdomen. Large hiatal hernia is partially included. There is ingested oral contrast throughout colon from prior CT, primarily in the ascending and transverse colon. Moderate volume of colonic stool. Mildly prominent air-filled small bowel loops is seen in the central abdomen. No free intra-abdominal air.  IMPRESSION: 1. Enteric tube is not seen. The large hiatal hernia is only partially included, previously the enteric tube was within the hernia. Chest imaging may be helpful to evaluate the hernia for enteric tube. 2. Nonobstructive bowel gas pattern, oral contrast throughout the colon from prior CT. Prominent small bowel  loops in the central abdomen are nonspecific and may reflect ileus.   Electronically Signed   By: Jeb Levering M.D.   On: 09/03/2014 23:23    Anti-infectives: Anti-infectives    Start     Dose/Rate Route Frequency Ordered Stop   09/04/14 1100  ciprofloxacin (CIPRO) IVPB 400 mg  Status:  Discontinued    Comments:  On call to the OR   400 mg200 mL/hr over 60 Minutes Intravenous  Once 09/03/14 0952 09/04/14 1640      Assessment/Plan: s/p Procedure(s): LAPAROSCOPIC REPAIR OF HIATAL HERNIA with fundoplication Doing well without apparent complication Start CL diet    LOS: 5 days    Shanika Levings T 09/05/2014

## 2014-09-06 LAB — GLUCOSE, CAPILLARY: GLUCOSE-CAPILLARY: 106 mg/dL — AB (ref 70–99)

## 2014-09-06 MED ORDER — OXYCODONE HCL 5 MG/5ML PO SOLN: 5.0000 mg | ORAL | Status: DC | PRN

## 2014-09-06 NOTE — Progress Notes (Signed)
Discharge instructions given along with prescription.  Questions answered

## 2014-09-06 NOTE — Progress Notes (Signed)
Patient ID: NAVIAH STRITE, female   DOB: 1935/06/15, 80 y.o.   MRN: [id number] 2 Days Post-Op  Subjective: No complaints today. Some soreness only. Tolerating clear liquids without nausea or dysphagia  Objective: Vital signs in last 24 hours: Temp:  [99.5 F (37.5 C)-100.1 F (37.8 C)] 100.1 F (37.8 C) (02/03 0532) Pulse Rate:  [101-110] 110 (02/03 0532) Resp:  [14-18] 18 (02/03 0532) BP: (134-144)/(69-77) 134/69 mmHg (02/03 0532) SpO2:  [94 %-97 %] 94 % (02/03 0532) Last BM Date: 08/30/14  Intake/Output from previous day: 02/02 0701 - 02/03 0700 In: 1814.2 [P.O.:240; I.V.:1574.2] Out: 2200 [Urine:2200] Intake/Output this shift:    General appearance: alert, cooperative and no distress GI: Soft with minimal appropriate incisional tenderness Incision/Wound: Clean and dry  Lab Results:   Recent Labs  09/05/14 0510  WBC 13.4*  HGB 11.2*  HCT 35.7*  PLT 306   BMET  Recent Labs  09/05/14 0510  NA 135  K 4.5  CL 104  CO2 24  GLUCOSE 156*  BUN 12  CREATININE 1.03  CALCIUM 8.7     Studies/Results: No results found.  Anti-infectives: Anti-infectives    Start     Dose/Rate Route Frequency Ordered Stop   09/04/14 1100  ciprofloxacin (CIPRO) IVPB 400 mg  Status:  Discontinued    Comments:  On call to the OR   400 mg200 mL/hr over 60 Minutes Intravenous  Once 09/03/14 [id number] 09/04/14 1640      Assessment/Plan: s/p Procedure(s): LAPAROSCOPIC REPAIR OF HIATAL HERNIA Doing very well without apparent complication. Okay for discharge.   LOS: 6 days    Jaques Mineer T 09/06/2014

## 2014-09-06 NOTE — Discharge Instructions (Signed)
[facility] ______[facility] °LAPAROSCOPIC SURGERY: POST OP INSTRUCTIONS °Always review your discharge instruction sheet given to you by the facility where your surgery was performed. °IF YOU HAVE DISABILITY OR FAMILY LEAVE FORMS, YOU MUST BRING THEM TO THE OFFICE FOR PROCESSING.   °DO NOT GIVE THEM TO YOUR DOCTOR. ° °1. A prescription for pain medication may be given to you upon discharge.  Take your pain medication as prescribed, if needed.  If narcotic pain medicine is not needed, then you may take acetaminophen (Tylenol) or ibuprofen (Advil) as needed. °2. Take your usually prescribed medications unless otherwise directed. °3. If you need a refill on your pain medication, please contact your pharmacy.  They will contact our office to request authorization. Prescriptions will not be filled after 5pm or on week-ends. °4. You should follow a light diet the first few days after arrival home, such as soup and crackers, etc.  Be sure to include lots of fluids daily. °5. Most patients will experience some swelling and bruising in the area of the incisions.  Ice packs will help.  Swelling and bruising can take several days to resolve.  °6. It is common to experience some constipation if taking pain medication after surgery.  Increasing fluid intake and taking a stool softener (such as Colace) will usually help or prevent this problem from occurring.  A mild laxative (Milk of Magnesia or Miralax) should be taken according to package instructions if there are no bowel movements after 48 hours. °7. Unless discharge instructions indicate otherwise, you may remove your bandages 24-48 hours after surgery, and you may shower at that time.  You may have steri-strips (small skin tapes) in place directly over the incision.  These strips should be left on the skin for 7-10 days.  If your surgeon used skin glue on the incision, you may shower in 24 hours.  The glue will flake off over the next 2-3 weeks.  Any sutures or  staples will be removed at the office during your follow-up visit. °8. ACTIVITIES:  You may resume regular (light) daily activities beginning the next day--such as daily self-care, walking, climbing stairs--gradually increasing activities as tolerated.  You may have sexual intercourse when it is comfortable.  Refrain from any heavy lifting or straining until approved by your doctor. °a. You may drive when you are no longer taking prescription pain medication, you can comfortably wear a seatbelt, and you can safely maneuver your car and apply brakes. °b. RETURN TO WORK:  __________________________________________________________ °9. You should see your doctor in the office for a follow-up appointment approximately 2-3 weeks after your surgery.  Make sure that you call for this appointment within a day or two after you arrive home to insure a convenient appointment time. °10. OTHER INSTRUCTIONS: __________________________________________________________________________________________________________________________ __________________________________________________________________________________________________________________________ °WHEN TO CALL YOUR DOCTOR: °1. Fever over 101.0 °2. Inability to urinate °3. Continued bleeding from incision. °4. Increased pain, redness, or drainage from the incision. °5. Increasing abdominal pain ° °The clinic staff is available to answer your questions during regular business hours.  Please don’t hesitate to call and ask to speak to one of the nurses for clinical concerns.  If you have a medical emergency, go to the nearest emergency room or call 911.  A surgeon from [facility] is always on call at the hospital. °[address], [address], [place], [place]  [address] ? [address] [address], [place], [place]   [address] °[phone] ? [phone] ? FAX [phone] °Web site:   [email or web address] °

## 2014-09-06 NOTE — Discharge Summary (Signed)
   Patient ID: Anna Hoffman [id number] 79 y.o. 1934/10/14  08/31/2014  Discharge date and time: 09/06/2014   Admitting Physician: Excell Seltzer T  Discharge Physician: Excell Seltzer T  Admission Diagnoses: Large paraesophageal hiatal hernia with gastric outlet obstruction  Discharge Diagnoses: Same  Operations: Procedure(s): LAPAROSCOPIC REPAIR OF HIATAL HERNIA NISSEN FUNDOPLICATION  Admission Condition: poor  Discharged Condition: good  Indication for Admission: Patient is a 80 year old female who presented to the hospital with acute epigastric abdominal pain and vomiting. CT scan revealed a large paraesophageal hiatal hernia with the majority of the stomach in the chest and evidence of gastric outlet obstruction.   Hospital Course: The patient underwent placement of an NG tube with improvement of her symptoms. Upper endoscopy was performed showing some erosions in the stomach consistent with her large hernia and gastric outlet obstruction at the hernia site unable to pass the gastroscope. We recommended proceeding with laparoscopic hiatal hernia repair. She remained very stable and nontender abdomen with NG tube in place and subsequently underwent laparoscopic repair of a giant hiatal hernia including Nissen fundoplication due to her symptoms of chronic GERD. Her postoperative course was unremarkable. On the first postoperative day she had minimal discomfort and no nausea. She was started on a clear liquid diet. She had some persistent mild tachycardia which was present at admission and throughout her hospitalization and unchanged. On the second postoperative day she is afebrile with stable vital signs. Has no complaints other than some expected soreness. Tolerating a liquid diet. Abdomen is soft and nontender and wounds healing well. She is felt ready for discharge.   Consults: GI  Significant Diagnostic Studies: radiology: CT scan: as above and endoscopy: gastroscopy: as  above   Disposition: Home  Patient Instructions:    Medication List    TAKE these medications        aspirin 81 MG tablet  Take 81 mg by mouth daily.     fexofenadine 180 MG tablet  Commonly known as:  ALLEGRA  Take 180 mg by mouth daily.     ibuprofen 200 MG tablet  Commonly known as:  ADVIL,MOTRIN  Take 400 mg by mouth every 8 (eight) hours as needed for pain.     oxyCODONE 5 MG/5ML solution  Commonly known as:  ROXICODONE  Take 5-10 mLs (5-10 mg total) by mouth every 4 (four) hours as needed.     rosuvastatin 10 MG tablet  Commonly known as:  CRESTOR  Take 10 mg by mouth daily.     STRESS TAB NF PO  Take 1 tablet by mouth daily.     TRIBENZOR 20-5-12.5 MG Tabs  Generic drug:  Olmesartan-Amlodipine-HCTZ  Take 1 tablet by mouth daily.     vitamin B-12 1000 MCG tablet  Commonly known as:  CYANOCOBALAMIN  Take 1,000 mcg by mouth daily.     vitamin C 1000 MG tablet  Take 1,000 mg by mouth daily.        Activity: activity as tolerated Diet: ureteral liquid diet for the first 2 weeks Wound Care: none needed  Follow-up:  With Dr. Excell Seltzer in 2 weeks.  Signed: Edward Jolly MD, FACS  09/06/2014, 8:14 AM

## 2015-02-19 ENCOUNTER — Other Ambulatory Visit: Payer: Self-pay

## 2015-02-19 DIAGNOSIS — Z1231 Encounter for screening mammogram for malignant neoplasm of breast: Secondary | ICD-10-CM

## 2015-03-20 ENCOUNTER — Ambulatory Visit
Admission: RE | Admit: 2015-03-20 | Discharge: 2015-03-20 | Disposition: A | Discharge status: Home / Self Care | Payer: Medicare HMO | Source: Ambulatory Visit

## 2015-03-20 DIAGNOSIS — Z1231 Encounter for screening mammogram for malignant neoplasm of breast: Secondary | ICD-10-CM

## 2015-09-13 DIAGNOSIS — N183 Chronic kidney disease, stage 3 (moderate): Secondary | ICD-10-CM | POA: Diagnosis not present

## 2015-09-13 DIAGNOSIS — I129 Hypertensive chronic kidney disease with stage 1 through stage 4 chronic kidney disease, or unspecified chronic kidney disease: Secondary | ICD-10-CM | POA: Diagnosis not present

## 2015-09-13 DIAGNOSIS — E1122 Type 2 diabetes mellitus with diabetic chronic kidney disease: Secondary | ICD-10-CM | POA: Diagnosis not present

## 2015-09-13 DIAGNOSIS — N08 Glomerular disorders in diseases classified elsewhere: Secondary | ICD-10-CM | POA: Diagnosis not present

## 2015-12-12 DIAGNOSIS — N183 Chronic kidney disease, stage 3 (moderate): Secondary | ICD-10-CM | POA: Diagnosis not present

## 2015-12-12 DIAGNOSIS — I129 Hypertensive chronic kidney disease with stage 1 through stage 4 chronic kidney disease, or unspecified chronic kidney disease: Secondary | ICD-10-CM | POA: Diagnosis not present

## 2015-12-12 DIAGNOSIS — N08 Glomerular disorders in diseases classified elsewhere: Secondary | ICD-10-CM | POA: Diagnosis not present

## 2015-12-12 DIAGNOSIS — E1122 Type 2 diabetes mellitus with diabetic chronic kidney disease: Secondary | ICD-10-CM | POA: Diagnosis not present

## 2016-01-03 DIAGNOSIS — I129 Hypertensive chronic kidney disease with stage 1 through stage 4 chronic kidney disease, or unspecified chronic kidney disease: Secondary | ICD-10-CM | POA: Diagnosis not present

## 2016-01-03 DIAGNOSIS — N183 Chronic kidney disease, stage 3 (moderate): Secondary | ICD-10-CM | POA: Diagnosis not present

## 2016-01-16 DIAGNOSIS — N183 Chronic kidney disease, stage 3 (moderate): Secondary | ICD-10-CM | POA: Diagnosis not present

## 2016-01-16 DIAGNOSIS — E1122 Type 2 diabetes mellitus with diabetic chronic kidney disease: Secondary | ICD-10-CM | POA: Diagnosis not present

## 2016-01-16 DIAGNOSIS — N08 Glomerular disorders in diseases classified elsewhere: Secondary | ICD-10-CM | POA: Diagnosis not present

## 2016-01-16 DIAGNOSIS — I129 Hypertensive chronic kidney disease with stage 1 through stage 4 chronic kidney disease, or unspecified chronic kidney disease: Secondary | ICD-10-CM | POA: Diagnosis not present

## 2016-02-04 ENCOUNTER — Inpatient Hospital Stay (HOSPITAL_COMMUNITY): Payer: Medicare Other

## 2016-02-04 ENCOUNTER — Encounter (HOSPITAL_COMMUNITY): Payer: Self-pay | Admitting: *Deleted

## 2016-02-04 ENCOUNTER — Inpatient Hospital Stay (HOSPITAL_COMMUNITY)
Admission: EM | Admit: 2016-02-04 | Discharge: 2016-02-08 | DRG: 314 | Disposition: A | Discharge status: Home / Self Care | Payer: Medicare Other | Attending: Family Medicine | Admitting: Family Medicine

## 2016-02-04 ENCOUNTER — Emergency Department (HOSPITAL_COMMUNITY): Payer: Medicare Other

## 2016-02-04 DIAGNOSIS — R012 Other cardiac sounds: Secondary | ICD-10-CM | POA: Diagnosis not present

## 2016-02-04 DIAGNOSIS — R58 Hemorrhage, not elsewhere classified: Secondary | ICD-10-CM | POA: Diagnosis not present

## 2016-02-04 DIAGNOSIS — I10 Essential (primary) hypertension: Secondary | ICD-10-CM | POA: Diagnosis not present

## 2016-02-04 DIAGNOSIS — Z7982 Long term (current) use of aspirin: Secondary | ICD-10-CM | POA: Diagnosis not present

## 2016-02-04 DIAGNOSIS — R1032 Left lower quadrant pain: Secondary | ICD-10-CM | POA: Diagnosis not present

## 2016-02-04 DIAGNOSIS — R1084 Generalized abdominal pain: Secondary | ICD-10-CM | POA: Diagnosis present

## 2016-02-04 DIAGNOSIS — D1771 Benign lipomatous neoplasm of kidney: Secondary | ICD-10-CM | POA: Diagnosis present

## 2016-02-04 DIAGNOSIS — E43 Unspecified severe protein-calorie malnutrition: Secondary | ICD-10-CM | POA: Diagnosis not present

## 2016-02-04 DIAGNOSIS — N2889 Other specified disorders of kidney and ureter: Secondary | ICD-10-CM | POA: Diagnosis present

## 2016-02-04 DIAGNOSIS — Z880 Allergy status to penicillin: Secondary | ICD-10-CM | POA: Diagnosis not present

## 2016-02-04 DIAGNOSIS — Z90710 Acquired absence of both cervix and uterus: Secondary | ICD-10-CM | POA: Diagnosis not present

## 2016-02-04 DIAGNOSIS — R109 Unspecified abdominal pain: Secondary | ICD-10-CM | POA: Diagnosis present

## 2016-02-04 DIAGNOSIS — Z882 Allergy status to sulfonamides status: Secondary | ICD-10-CM

## 2016-02-04 DIAGNOSIS — K529 Noninfective gastroenteritis and colitis, unspecified: Secondary | ICD-10-CM | POA: Diagnosis present

## 2016-02-04 DIAGNOSIS — E876 Hypokalemia: Secondary | ICD-10-CM | POA: Diagnosis present

## 2016-02-04 DIAGNOSIS — R112 Nausea with vomiting, unspecified: Secondary | ICD-10-CM | POA: Diagnosis not present

## 2016-02-04 DIAGNOSIS — Z809 Family history of malignant neoplasm, unspecified: Secondary | ICD-10-CM

## 2016-02-04 DIAGNOSIS — D62 Acute posthemorrhagic anemia: Secondary | ICD-10-CM | POA: Diagnosis not present

## 2016-02-04 DIAGNOSIS — D3002 Benign neoplasm of left kidney: Secondary | ICD-10-CM | POA: Diagnosis not present

## 2016-02-04 DIAGNOSIS — R197 Diarrhea, unspecified: Secondary | ICD-10-CM | POA: Diagnosis present

## 2016-02-04 DIAGNOSIS — Z79899 Other long term (current) drug therapy: Secondary | ICD-10-CM | POA: Diagnosis not present

## 2016-02-04 DIAGNOSIS — R1110 Vomiting, unspecified: Secondary | ICD-10-CM | POA: Diagnosis present

## 2016-02-04 DIAGNOSIS — K661 Hemoperitoneum: Secondary | ICD-10-CM | POA: Diagnosis not present

## 2016-02-04 DIAGNOSIS — K683 Retroperitoneal hematoma: Secondary | ICD-10-CM | POA: Diagnosis present

## 2016-02-04 DIAGNOSIS — E119 Type 2 diabetes mellitus without complications: Secondary | ICD-10-CM | POA: Diagnosis present

## 2016-02-04 HISTORY — DX: Hypokalemia: E87.6

## 2016-02-04 HISTORY — DX: Type 2 diabetes mellitus without complications: E11.9

## 2016-02-04 LAB — URINALYSIS, ROUTINE W REFLEX MICROSCOPIC
Bilirubin Urine: NEGATIVE
GLUCOSE, UA: NEGATIVE mg/dL
Hgb urine dipstick: NEGATIVE
KETONES UR: NEGATIVE mg/dL
NITRITE: NEGATIVE
PH: 5.5 (ref 5.0–8.0)
Protein, ur: 30 mg/dL — AB
Specific Gravity, Urine: 1.019 (ref 1.005–1.030)

## 2016-02-04 LAB — COMPREHENSIVE METABOLIC PANEL
ALBUMIN: 3.9 g/dL (ref 3.5–5.0)
ALK PHOS: 60 U/L (ref 38–126)
ALT: 17 U/L (ref 14–54)
AST: 26 U/L (ref 15–41)
Anion gap: 12 (ref 5–15)
BILIRUBIN TOTAL: 0.8 mg/dL (ref 0.3–1.2)
BUN: 22 mg/dL — AB (ref 6–20)
CO2: 20 mmol/L — ABNORMAL LOW (ref 22–32)
Calcium: 8.8 mg/dL — ABNORMAL LOW (ref 8.9–10.3)
Chloride: 107 mmol/L (ref 101–111)
Creatinine, Ser: 1.16 mg/dL — ABNORMAL HIGH (ref 0.44–1.00)
GFR calc Af Amer: 51 mL/min — ABNORMAL LOW (ref >60)
GFR calc non Af Amer: 44 mL/min — ABNORMAL LOW (ref >60)
GLUCOSE: 174 mg/dL — AB (ref 65–99)
POTASSIUM: 3.2 mmol/L — AB (ref 3.5–5.1)
Sodium: 139 mmol/L (ref 135–145)
TOTAL PROTEIN: 6.4 g/dL — AB (ref 6.5–8.1)

## 2016-02-04 LAB — CBC
HCT: 26.8 % — ABNORMAL LOW (ref 36.0–46.0)
HEMATOCRIT: 34.4 % — AB (ref 36.0–46.0)
Hemoglobin: 11.4 g/dL — ABNORMAL LOW (ref 12.0–15.0)
Hemoglobin: 9 g/dL — ABNORMAL LOW (ref 12.0–15.0)
MCH: 28.6 pg (ref 26.0–34.0)
MCH: 28 pg (ref 26.0–34.0)
MCHC: 33.1 g/dL (ref 30.0–36.0)
MCHC: 33.6 g/dL (ref 30.0–36.0)
MCV: 86.4 fL (ref 78.0–100.0)
MCV: 83.5 fL (ref 78.0–100.0)
Platelets: 402 10*3/uL — ABNORMAL HIGH (ref 150–400)
Platelets: 302 10*3/uL (ref 150–400)
RBC: 3.98 MIL/uL (ref 3.87–5.11)
RBC: 3.21 MIL/uL — ABNORMAL LOW (ref 3.87–5.11)
RDW: 14.8 % (ref 11.5–15.5)
RDW: 14.6 % (ref 11.5–15.5)
WBC: 17.4 10*3/uL — ABNORMAL HIGH (ref 4.0–10.5)
WBC: 14.2 10*3/uL — ABNORMAL HIGH (ref 4.0–10.5)

## 2016-02-04 LAB — URINE MICROSCOPIC-ADD ON

## 2016-02-04 LAB — C DIFFICILE QUICK SCREEN W PCR REFLEX
C Diff antigen: NEGATIVE
C Diff interpretation: NEGATIVE
C Diff toxin: NEGATIVE

## 2016-02-04 LAB — I-STAT TROPONIN, ED: Troponin i, poc: 0.01 ng/mL (ref 0.00–0.08)

## 2016-02-04 LAB — LIPASE, BLOOD: Lipase: 16 U/L (ref 11–51)

## 2016-02-04 LAB — CBG MONITORING, ED: GLUCOSE-CAPILLARY: 181 mg/dL — AB (ref 65–99)

## 2016-02-04 LAB — GLUCOSE, CAPILLARY
GLUCOSE-CAPILLARY: 116 mg/dL — AB (ref 65–99)
Glucose-Capillary: 116 mg/dL — ABNORMAL HIGH (ref 65–99)

## 2016-02-04 MED ORDER — ONDANSETRON HCL 4 MG/2ML IJ SOLN
4.0000 mg | Freq: Three times a day (TID) | INTRAMUSCULAR | Status: DC | PRN
Start: 2016-02-04 — End: 2016-02-04

## 2016-02-04 MED ORDER — VITAMIN B-12 1000 MCG PO TABS
1000.0000 ug | ORAL_TABLET | Freq: Every day | ORAL | Status: DC
  Administered 2016-02-04 – 2016-02-08 (×5): 1000 ug via ORAL
  Filled 2016-02-04 (×5): qty 1

## 2016-02-04 MED ORDER — OLMESARTAN-AMLODIPINE-HCTZ 20-5-12.5 MG PO TABS: 1.0000 | ORAL_TABLET | Freq: Every day | ORAL | Status: DC

## 2016-02-04 MED ORDER — IOPAMIDOL (ISOVUE-300) INJECTION 61%
75.0000 mL | Freq: Once | INTRAVENOUS | Status: AC | PRN
  Administered 2016-02-04: 75 mL via INTRAVENOUS

## 2016-02-04 MED ORDER — INSULIN ASPART 100 UNIT/ML ~~LOC~~ SOLN: 0.0000 [IU] | Freq: Every day | SUBCUTANEOUS | Status: DC

## 2016-02-04 MED ORDER — SODIUM CHLORIDE 0.9 % IV BOLUS (SEPSIS)
500.0000 mL | Freq: Once | INTRAVENOUS | Status: AC
Start: 2016-02-04 — End: 2016-02-04
  Administered 2016-02-04: 500 mL via INTRAVENOUS

## 2016-02-04 MED ORDER — INSULIN ASPART 100 UNIT/ML ~~LOC~~ SOLN
0.0000 [IU] | Freq: Three times a day (TID) | SUBCUTANEOUS | Status: DC
  Administered 2016-02-07: 1 [IU] via SUBCUTANEOUS

## 2016-02-04 MED ORDER — SODIUM CHLORIDE 0.9 % IV SOLN
INTRAVENOUS | Status: AC
  Administered 2016-02-04: 18:00:00 via INTRAVENOUS

## 2016-02-04 MED ORDER — POTASSIUM CHLORIDE IN NACL 20-0.9 MEQ/L-% IV SOLN
INTRAVENOUS | Status: AC
  Administered 2016-02-04 – 2016-02-05 (×2): via INTRAVENOUS
  Filled 2016-02-04 (×2): qty 1000

## 2016-02-04 MED ORDER — ACETAMINOPHEN 650 MG RE SUPP: 650.0000 mg | Freq: Four times a day (QID) | RECTAL | Status: DC | PRN

## 2016-02-04 MED ORDER — ONDANSETRON HCL 4 MG/2ML IJ SOLN
4.0000 mg | Freq: Four times a day (QID) | INTRAMUSCULAR | Status: DC | PRN
  Administered 2016-02-05 – 2016-02-07 (×5): 4 mg via INTRAVENOUS
  Filled 2016-02-04 (×6): qty 2

## 2016-02-04 MED ORDER — PANTOPRAZOLE SODIUM 40 MG PO TBEC
40.0000 mg | DELAYED_RELEASE_TABLET | Freq: Every day | ORAL | Status: DC
  Administered 2016-02-04 – 2016-02-08 (×5): 40 mg via ORAL
  Filled 2016-02-04 (×5): qty 1

## 2016-02-04 MED ORDER — DIATRIZOATE MEGLUMINE & SODIUM 66-10 % PO SOLN
30.0000 mL | Freq: Once | ORAL | Status: DC
  Filled 2016-02-04: qty 30

## 2016-02-04 MED ORDER — HYDROCHLOROTHIAZIDE 12.5 MG PO CAPS
12.5000 mg | ORAL_CAPSULE | Freq: Every day | ORAL | Status: DC
  Administered 2016-02-04 – 2016-02-08 (×5): 12.5 mg via ORAL
  Filled 2016-02-04 (×5): qty 1

## 2016-02-04 MED ORDER — CIPROFLOXACIN IN D5W 400 MG/200ML IV SOLN
400.0000 mg | Freq: Two times a day (BID) | INTRAVENOUS | Status: DC
  Administered 2016-02-04 – 2016-02-05 (×2): 400 mg via INTRAVENOUS
  Filled 2016-02-04 (×2): qty 200

## 2016-02-04 MED ORDER — ONDANSETRON HCL 4 MG/2ML IJ SOLN
4.0000 mg | Freq: Once | INTRAMUSCULAR | Status: AC
  Administered 2016-02-04: 4 mg via INTRAVENOUS
  Filled 2016-02-04: qty 2

## 2016-02-04 MED ORDER — ENOXAPARIN SODIUM 40 MG/0.4ML ~~LOC~~ SOLN
40.0000 mg | SUBCUTANEOUS | Status: DC
  Filled 2016-02-04: qty 0.4

## 2016-02-04 MED ORDER — IOPAMIDOL (ISOVUE-300) INJECTION 61%: 100.0000 mL | Freq: Once | INTRAVENOUS | Status: DC | PRN

## 2016-02-04 MED ORDER — ROSUVASTATIN CALCIUM 10 MG PO TABS
10.0000 mg | ORAL_TABLET | Freq: Every day | ORAL | Status: DC
Start: 2016-02-04 — End: 2016-02-08
  Administered 2016-02-04 – 2016-02-08 (×5): 10 mg via ORAL
  Filled 2016-02-04 (×5): qty 1

## 2016-02-04 MED ORDER — IRBESARTAN 150 MG PO TABS
150.0000 mg | ORAL_TABLET | Freq: Every day | ORAL | Status: DC
  Administered 2016-02-04 – 2016-02-08 (×5): 150 mg via ORAL
  Filled 2016-02-04 (×5): qty 1

## 2016-02-04 MED ORDER — METRONIDAZOLE IN NACL 5-0.79 MG/ML-% IV SOLN
500.0000 mg | Freq: Three times a day (TID) | INTRAVENOUS | Status: DC
  Administered 2016-02-04 – 2016-02-05 (×2): 500 mg via INTRAVENOUS
  Filled 2016-02-04 (×2): qty 100

## 2016-02-04 MED ORDER — ALBUTEROL SULFATE (2.5 MG/3ML) 0.083% IN NEBU
5.0000 mg | INHALATION_SOLUTION | Freq: Once | RESPIRATORY_TRACT | Status: DC
Start: 2016-02-04 — End: 2016-02-04

## 2016-02-04 MED ORDER — ACETAMINOPHEN 325 MG PO TABS
650.0000 mg | ORAL_TABLET | Freq: Four times a day (QID) | ORAL | Status: DC | PRN
  Administered 2016-02-06 – 2016-02-07 (×3): 650 mg via ORAL
  Filled 2016-02-04 (×4): qty 2

## 2016-02-04 NOTE — ED Provider Notes (Signed)
CSN: [id number]     Arrival date & time 02/04/16  1244 History   First MD Initiated Contact with Patient 02/04/16 1402     Chief Complaint  Patient presents with  . Diarrhea  . Vomiting      HPI Pt reports sudden onset of mid abd pain, back pain with n/v/d this am. Pt reports emesis x 2 And diarrhea x 3. Pt is A&Ox 4. Denise any SOB, dizziness or cp at this time. Past Medical History  Diagnosis Date  . Hypertension   . Diabetes mellitus without complication [facility])    Past Surgical History  Procedure Laterality Date  . Abdominal hysterectomy    . Knee surgery    . Esophagogastroduodenoscopy N/A 09/01/2014    Procedure: ESOPHAGOGASTRODUODENOSCOPY (EGD);  Surgeon: Lear Ng, MD;  Location: [facility];  Service: Endoscopy;  Laterality: N/A;  . Hiatal hernia repair N/A 09/04/2014    Procedure: LAPAROSCOPIC REPAIR OF HIATAL HERNIA;  Surgeon: Excell Seltzer, MD;  Location: [facility];  Service: General;  Laterality: N/A;  With MESH   No family history on file. Social History  Substance Use Topics  . Smoking status: Never Smoker   . Smokeless tobacco: None  . Alcohol Use: No   OB History    No data available     Review of Systems  All other systems reviewed and are negative.     Allergies  Amoxicillin and Sulfa antibiotics  Home Medications   Prior to Admission medications   Medication Sig Start Date End Date Taking? Authorizing Provider  Ascorbic Acid (VITAMIN C) 1000 MG tablet Take 1,000 mg by mouth daily.   Yes Historical Provider, MD  aspirin 81 MG tablet Take 81 mg by mouth daily.   Yes Historical Provider, MD  fexofenadine (ALLEGRA) 180 MG tablet Take 180 mg by mouth daily.   Yes Historical Provider, MD  ibuprofen (ADVIL,MOTRIN) 200 MG tablet Take 400 mg by mouth every 8 (eight) hours as needed for pain.   Yes Historical Provider, MD  Multiple Vitamins-Minerals (STRESS TAB NF PO) Take 1 tablet by mouth daily.   Yes Historical Provider, MD   Olmesartan-Amlodipine-HCTZ (TRIBENZOR) 20-5-12.5 MG TABS Take 1 tablet by mouth daily.   Yes Historical Provider, MD  oxyCODONE (ROXICODONE) 5 MG/5ML solution Take 5-10 mLs (5-10 mg total) by mouth every 4 (four) hours as needed. Patient taking differently: Take 5-10 mg by mouth every 4 (four) hours as needed for severe pain.  09/06/14  Yes Excell Seltzer, MD  rosuvastatin (CRESTOR) 10 MG tablet Take 10 mg by mouth daily.   Yes Historical Provider, MD  vitamin B-12 (CYANOCOBALAMIN) 1000 MCG tablet Take 1,000 mcg by mouth daily.   Yes Historical Provider, MD   BP 142/85 mmHg  Pulse 89  Temp(Src) 97.6 F (36.4 C) (Oral)  Resp 18  Ht 5\' 5"  (1.651 m)  Wt 129 lb (58.514 kg)  BMI 21.47 kg/m2  SpO2 94% Physical Exam  Constitutional: She is oriented to person, place, and time. She appears well-developed and well-nourished. No distress.  HENT:  Head: Normocephalic and atraumatic.  Eyes: Pupils are equal, round, and reactive to light.  Neck: Normal range of motion.  Cardiovascular: Normal rate and intact distal pulses.   Pulmonary/Chest: No respiratory distress.  Abdominal: Soft. Normal appearance. She exhibits no distension. There is no tenderness. There is no rebound and no guarding.  Musculoskeletal: Normal range of motion.  Neurological: She is alert and oriented to person, place, and time. No cranial nerve  deficit.  Skin: Skin is warm and dry. No rash noted.  Psychiatric: She has a normal mood and affect. Her behavior is normal.  Nursing note and vitals reviewed.   ED Course  Procedures (including critical care time) Medications  sodium chloride 0.9 % bolus 500 mL (500 mLs Intravenous New Bag/Given 02/04/16 1428)  ondansetron (ZOFRAN) injection 4 mg (4 mg Intravenous Given 02/04/16 1428)    Labs Review Labs Reviewed  COMPREHENSIVE METABOLIC PANEL - Abnormal; Notable for the following:    Potassium 3.2 (*)    CO2 20 (*)    Glucose, Bld 174 (*)    BUN 22 (*)    Creatinine, Ser  1.16 (*)    Calcium 8.8 (*)    Total Protein 6.4 (*)    GFR calc non Af Amer 44 (*)    GFR calc Af Amer 51 (*)    All other components within normal limits  CBC - Abnormal; Notable for the following:    WBC 17.4 (*)    Hemoglobin 11.4 (*)    HCT 34.4 (*)    Platelets 402 (*)    All other components within normal limits  URINALYSIS, ROUTINE W REFLEX MICROSCOPIC (NOT AT [facility]) - Abnormal; Notable for the following:    Protein, ur 30 (*)    Leukocytes, UA TRACE (*)    All other components within normal limits  URINE MICROSCOPIC-ADD ON - Abnormal; Notable for the following:    Squamous Epithelial / LPF 0-5 (*)    Bacteria, UA FEW (*)    Casts HYALINE CASTS (*)    Crystals CA OXALATE CRYSTALS (*)    All other components within normal limits  CBG MONITORING, ED - Abnormal; Notable for the following:    Glucose-Capillary 181 (*)    All other components within normal limits  C DIFFICILE QUICK SCREEN W PCR REFLEX  LIPASE, BLOOD  I-STAT TROPOININ, ED    Imaging Review Dg Abd Acute W/chest  02/04/2016  CLINICAL DATA:  Sudden onset of mid abdominal pain and back pain starting this morning, vomiting, diarrhea EXAM: DG ABDOMEN ACUTE W/ 1V CHEST COMPARISON:  09/03/2014 FINDINGS: Cardiomediastinal silhouette is unremarkable. No acute infiltrate or pleural effusion. No pulmonary edema. There is normal small bowel gas pattern. Moderate stool noted in right colon. Degenerative changes are noted lower lumbar spine. No evidence of free abdominal air. IMPRESSION: No acute disease. Moderate stool noted in right colon. No free abdominal air. Normal small bowel gas pattern. Electronically Signed   By: Lahoma Crocker M.D.   On: 02/04/2016 15:18   I have personally reviewed and evaluated these images and lab results as part of my medical decision-making.    MDM   Final diagnoses:  Gastroenteritis        Leonard Schwartz, MD 02/04/16 [id number]

## 2016-02-04 NOTE — ED Notes (Signed)
Report given to Jacqueline-transfer to 5 th floor

## 2016-02-04 NOTE — ED Notes (Signed)
Pt reports sudden onset of mid abd pain, back pain with n/v/d this am.  Pt reports emesis x 2  And diarrhea x 3.  Pt is A&Ox 4.  Denise any SOB, dizziness or cp at this time.

## 2016-02-04 NOTE — ED Notes (Signed)
Bed: [id number] Expected date:  Expected time:  Means of arrival:  Comments: No bed/monitor

## 2016-02-04 NOTE — Consult Note (Signed)
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 Hemoglobin this afternoon was 11.4 which is stable from 1 year ago. As part of her assessment she underwent CT imaging of her abdomen and pelvis. This revealed a large acute retroperitoneal hemorrhage on the left side. This appeared to arise from a heterogeneous 3.54.5 cm mass within the upper pole left kidney. The mass did contain fat suggesting a high likelihood of angiomyolipoma. The renal mass was noted in 2016 and may have been present way back in 2006 as well. This is never been evaluated by urology according to the patient. She has been told that she did have a tumor in her kidney but no referrals were made. She is not complaining of any recent flank discomfort. She has no other urologic history. She is currently having mild discomfort in her left lower quadrant and lower flank region. She has remained hemodynamically stable. Her hemoglobin this evening has fallen to 9.0. The patient was on Lovenox for DVT prophylaxis at the time of her CT imaging but that has now been discontinued.  Past Medical History  Diagnosis Date  . Hypertension   . Diabetes mellitus without complication ([place])   . Hypokalemia    Past Surgical History  Procedure Laterality Date  . Abdominal hysterectomy    . Knee surgery    . Esophagogastroduodenoscopy N/A 09/01/2014    Procedure: ESOPHAGOGASTRODUODENOSCOPY (EGD);  Surgeon: Lear Ng, MD;  Location: [facility];  Service: Endoscopy;  Laterality: N/A;  . Hiatal hernia repair N/A 09/04/2014    Procedure: LAPAROSCOPIC REPAIR OF HIATAL HERNIA;  Surgeon: Excell Seltzer, MD;  Location: [facility];  Service: General;  Laterality: N/A;  With MESH     Medications:  Scheduled: . sodium chloride   Intravenous STAT  . ciprofloxacin  400 mg Intravenous Q12H  . diatrizoate meglumine-sodium  30 mL Oral Once  . irbesartan  150 mg Oral Daily   And  . hydrochlorothiazide  12.5 mg Oral Daily  . insulin aspart  0-5 Units Subcutaneous QHS  . insulin aspart  0-9 Units Subcutaneous TID WC  . metronidazole  500 mg Intravenous Q8H  . pantoprazole  40 mg Oral Daily  . rosuvastatin  10 mg Oral Daily  . vitamin B-12  1,000 mcg Oral Daily    Allergies:  Allergies  Allergen Reactions  . Amoxicillin Diarrhea    Has patient had a PCN reaction causing immediate rash, facial/tongue/throat swelling, SOB or lightheadedness with hypotension: no Has patient had a PCN reaction causing severe rash involving mucus membranes or skin necrosis: no Has patient had a PCN reaction that required hospitalization: no pharmacist consult Has patient had a PCN reaction occurring within the last 10 years: yes If all of the above answers are "NO", then may proceed with Cephalosporin use.   . Sulfa Antibiotics Rash    Family History  Problem Relation Age of Onset  . Prostate cancer Father     Social History:  reports that she has never smoked. She does not have any smokeless tobacco history on file. She reports that she does not drink alcohol or use illicit drugs.  ROS notable for diarrhea this morning and on one occasion since admission. She does report some mild nausea but has a reasonable appetite. She does have some left  lower quadrant abdominal pain. She has had no fever, chills, obvious blood per rectum. Review of systems otherwise negative.  Physical Exam:  Vital signs in last 24 hours: Temp:  [97.6 F (36.4 C)-98.3 F (36.8 C)] 98.3 F (36.8 C) (07/03 2100) Pulse Rate:  [84-94] 88 (07/03 2100) Resp:  [18-20] 20 (07/03 2100) BP: (142-154)/(79-89) 154/79 mmHg (07/03 2100) SpO2:  [94 %-99 %] 98 % (07/03 2100) Weight:  [58.514 kg (129 lb)] 58.514 kg  (129 lb) (07/03 1303)  Constitutional: Vital signs reviewed. WD WN in NAD Head: Normocephalic and atraumatic   Eyes: PERRL, No scleral icterus.  Neck: Supple No  Gross JVD, mass  Cardiovascular: RRR Pulmonary/Chest: Normal effort Abdominal: Soft. Mild left lower quadrant tenderness, non-distended, bowel sounds are normal, no masses, organomegaly, or guarding present.   Extremities: No cyanosis or edema  Neurological: Grossly non-focal.  Skin: Warm,very dry and intact. No rash, cyanosis   Laboratory Data:  Results for orders placed or performed during the hospital encounter of 02/04/16 (from the past 72 hour(s))  Lipase, blood     Status: None   Collection Time: 02/04/16 12:55 PM  Result Value Ref Range   Lipase 16 11 - 51 U/L  Comprehensive metabolic panel     Status: Abnormal   Collection Time: 02/04/16 12:55 PM  Result Value Ref Range   Sodium 139 135 - 145 mmol/L   Potassium 3.2 (L) 3.5 - 5.1 mmol/L   Chloride 107 101 - 111 mmol/L   CO2 20 (L) 22 - 32 mmol/L   Glucose, Bld 174 (H) 65 - 99 mg/dL   BUN 22 (H) 6 - 20 mg/dL   Creatinine, Ser 1.16 (H) 0.44 - 1.00 mg/dL   Calcium 8.8 (L) 8.9 - 10.3 mg/dL   Total Protein 6.4 (L) 6.5 - 8.1 g/dL   Albumin 3.9 3.5 - 5.0 g/dL   AST 26 15 - 41 U/L   ALT 17 14 - 54 U/L   Alkaline Phosphatase 60 38 - 126 U/L   Total Bilirubin 0.8 0.3 - 1.2 mg/dL   GFR calc non Af Amer 44 (L) >60 mL/min   GFR calc Af Amer 51 (L) >60 mL/min    Comment: (NOTE) The eGFR has been calculated using the CKD EPI equation. This calculation has not been validated in all clinical situations. eGFR's persistently <60 mL/min signify possible Chronic Kidney Disease.    Anion gap 12 5 - 15  CBC     Status: Abnormal   Collection Time: 02/04/16 12:55 PM  Result Value Ref Range   WBC 17.4 (H) 4.0 - 10.5 K/uL   RBC 3.98 3.87 - 5.11 MIL/uL   Hemoglobin 11.4 (L) 12.0 - 15.0 g/dL   HCT 34.4 (L) 36.0 - 46.0 %   MCV 86.4 78.0 - 100.0 fL   MCH 28.6 26.0 - 34.0 pg    MCHC 33.1 30.0 - 36.0 g/dL   RDW 14.8 11.5 - 15.5 %   Platelets 402 (H) 150 - 400 K/uL  CBG monitoring, ED     Status: Abnormal   Collection Time: 02/04/16 12:56 PM  Result Value Ref Range   Glucose-Capillary 181 (H) 65 - 99 mg/dL  I-Stat Troponin, ED (not at [facility])     Status: None   Collection Time: 02/04/16  1:15 PM  Result Value Ref Range   Troponin i, poc 0.01 0.00 - 0.08 ng/mL   Comment 3            Comment: Due to   the release kinetics of cTnI, a negative result within the first hours of the onset of symptoms does not rule out myocardial infarction with certainty. If myocardial infarction is still suspected, repeat the test at appropriate intervals.   C difficile quick scan w PCR reflex     Status: None   Collection Time: 02/04/16  2:27 PM  Result Value Ref Range   C Diff antigen NEGATIVE NEGATIVE   C Diff toxin NEGATIVE NEGATIVE   C Diff interpretation Negative for toxigenic C. difficile   Urinalysis, Routine w reflex microscopic     Status: Abnormal   Collection Time: 02/04/16  2:59 PM  Result Value Ref Range   Color, Urine YELLOW YELLOW   APPearance CLEAR CLEAR   Specific Gravity, Urine 1.019 1.005 - 1.030   pH 5.5 5.0 - 8.0   Glucose, UA NEGATIVE NEGATIVE mg/dL   Hgb urine dipstick NEGATIVE NEGATIVE   Bilirubin Urine NEGATIVE NEGATIVE   Ketones, ur NEGATIVE NEGATIVE mg/dL   Protein, ur 30 (A) NEGATIVE mg/dL   Nitrite NEGATIVE NEGATIVE   Leukocytes, UA TRACE (A) NEGATIVE  Urine microscopic-add on     Status: Abnormal   Collection Time: 02/04/16  2:59 PM  Result Value Ref Range   Squamous Epithelial / LPF 0-5 (A) NONE SEEN   WBC, UA 0-5 0 - 5 WBC/hpf   RBC / HPF 0-5 0 - 5 RBC/hpf   Bacteria, UA FEW (A) NONE SEEN   Casts HYALINE CASTS (A) NEGATIVE   Crystals CA OXALATE CRYSTALS (A) NEGATIVE  Glucose, capillary     Status: Abnormal   Collection Time: 02/04/16  6:23 PM  Result Value Ref Range   Glucose-Capillary 116 (H) 65 - 99 mg/dL  Glucose, capillary      Status: Abnormal   Collection Time: 02/04/16  9:37 PM  Result Value Ref Range   Glucose-Capillary 116 (H) 65 - 99 mg/dL  Type and screen [place]     Status: None (Preliminary result)   Collection Time: 02/04/16  9:58 PM  Result Value Ref Range   ABO/RH(D) AB POS    Antibody Screen PENDING    Sample Expiration 02/07/2016   CBC     Status: Abnormal   Collection Time: 02/04/16  9:58 PM  Result Value Ref Range   WBC 14.2 (H) 4.0 - 10.5 K/uL   RBC 3.21 (L) 3.87 - 5.11 MIL/uL   Hemoglobin 9.0 (L) 12.0 - 15.0 g/dL    Comment: DELTA CHECK NOTED REPEATED TO VERIFY    HCT 26.8 (L) 36.0 - 46.0 %   MCV 83.5 78.0 - 100.0 fL   MCH 28.0 26.0 - 34.0 pg   MCHC 33.6 30.0 - 36.0 g/dL   RDW 14.6 11.5 - 15.5 %   Platelets 302 150 - 400 K/uL   Recent Results (from the past 240 hour(s))  C difficile quick scan w PCR reflex     Status: None   Collection Time: 02/04/16  2:27 PM  Result Value Ref Range Status   C Diff antigen NEGATIVE NEGATIVE Final   C Diff toxin NEGATIVE NEGATIVE Final   C Diff interpretation Negative for toxigenic C. 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 While there are some rare reports of fat-containing renal cell carcinoma the presence of fat in this lesion makes  a benign angiomyolipoma much more likely. These lesions do have a propensity for spontaneous hemorrhage. She apparently did have a renal abnormality dating back 10 years ago which again makes a benign mass much more likely. At this time there is no indication for urgent intervention. With discontinuation of anticoagulation and the bleeding will hopefully stop spontaneously. She needs to have a hemoglobin checked every 6-8 hours over the next 48 hours to assure that there is not ongoing rapid drop in  her hemoglobin. If her hemoglobin does continue to fall and/or if she does become hemodynamically unstable then in addition to transfusion she will require urgent consultation with interventional radiology for consideration of angioplasty embolization. If she does not require urgent intervention and then a discussion will need to be undertaken with regard to treatment options down the road. This could include ongoing observation given her age but also consideration for partial nephrectomy, elective embolization or even potentially an ablative procedures such as cryoablation.  Plan:  As above  Anamari Galeas S 02/04/2016, 10:34 PM

## 2016-02-04 NOTE — ED Notes (Signed)
Pt. Aware of urine specimen. Will collect urine when pt. Voids. Nurse aware.  

## 2016-02-04 NOTE — ED Notes (Signed)
hospitalist at bedside

## 2016-02-04 NOTE — H&P (Addendum)
TRH H&P   Patient Demographics:    Anna Hoffman, is a 81 y.o. female  MRN: [id number]   DOB - July 16, 1935  Admit Date - 02/04/2016  Outpatient Primary MD for the patient is Maximino Greenland, MD  Referring MD/NP/PA:  Leonard Schwartz  Outpatient Specialists:  None per pt  Patient coming from: home  Chief Complaint  Patient presents with  . Diarrhea  . Vomiting      HPI:    Anna Hoffman  is a 81 y.o. female, w hx of paraesophageal hernia, apparently c/o n/v starting this am.  Pt also notes diarreah, loose stool.  Starting this am as well.  Pt also notes left lower quadrant pain.  Starting this am.  Denies fever, chills, brbpr, black stool.   In ED, pt had KUB=> + stool,  No obstruction, no ileus.  Pt will be admitted for n/v, abd pain, diarrhea.     Review of systems:    In addition to the HPI above,  No Fever-chills, No Headache, No changes with Vision or hearing, No problems swallowing food or Liquids, No Chest pain, Cough or Shortness of Breath, No Blood in stool or Urine, No dysuria, No new skin rashes or bruises, No new joints pains-aches,  No new weakness, tingling, numbness in any extremity, No recent weight gain or loss, No polyuria, polydypsia or polyphagia, No significant Mental Stressors.  A full 10 point Review of Systems was done, except as stated above, all other Review of Systems were negative.   With Past History of the following :    Past Medical History  Diagnosis Date  . Hypertension   . Diabetes mellitus without complication ([place])   . Hypokalemia       Past Surgical History  Procedure Laterality Date  . Abdominal hysterectomy    . Knee surgery    . Esophagogastroduodenoscopy N/A 09/01/2014    Procedure: ESOPHAGOGASTRODUODENOSCOPY (EGD);  Surgeon: Lear Ng, MD;  Location: [facility];  Service: Endoscopy;  Laterality: N/A;  . Hiatal  hernia repair N/A 09/04/2014    Procedure: LAPAROSCOPIC REPAIR OF HIATAL HERNIA;  Surgeon: Excell Seltzer, MD;  Location: [facility];  Service: General;  Laterality: N/A;  With MESH      Social History:     Social History  Substance Use Topics  . Smoking status: Never Smoker   . Smokeless tobacco: Not on file  . Alcohol Use: No     Lives - at home.   Mobility -    Family History :     Family History  Problem Relation Age of Onset  . Prostate cancer Father       Home Medications:   Prior to Admission medications   Medication Sig Start Date End Date Taking? Authorizing Provider  Ascorbic Acid (VITAMIN C) 1000 MG tablet Take 1,000 mg by mouth daily.   Yes Historical Provider, MD  aspirin 81 MG tablet Take 81 mg by mouth daily.   Yes Historical Provider, MD  fexofenadine (ALLEGRA) 180 MG tablet Take 180 mg by mouth daily.   Yes Historical Provider, MD  ibuprofen (ADVIL,MOTRIN) 200 MG tablet Take 400 mg by mouth every 8 (eight) hours as needed for pain.   Yes Historical Provider, MD  Multiple Vitamins-Minerals (STRESS TAB NF PO) Take 1 tablet by mouth daily.   Yes Historical Provider, MD  Olmesartan-Amlodipine-HCTZ (TRIBENZOR) 20-5-12.5 MG TABS Take 1 tablet by mouth daily.   Yes Historical Provider, MD  oxyCODONE (ROXICODONE) 5 MG/5ML solution Take 5-10 mLs (5-10 mg total) by mouth every 4 (four) hours as needed. Patient taking differently: Take 5-10 mg by mouth every 4 (four) hours as needed for severe pain.  09/06/14  Yes Excell Seltzer, MD  rosuvastatin (CRESTOR) 10 MG tablet Take 10 mg by mouth daily.   Yes Historical Provider, MD  vitamin B-12 (CYANOCOBALAMIN) 1000 MCG tablet Take 1,000 mcg by mouth daily.   Yes Historical Provider, MD     Allergies:     Allergies  Allergen Reactions  . Amoxicillin Diarrhea    Has patient had a PCN reaction causing immediate rash, facial/tongue/throat swelling, SOB or lightheadedness with hypotension: no Has patient had a PCN  reaction causing severe rash involving mucus membranes or skin necrosis: no Has patient had a PCN reaction that required hospitalization: no pharmacist consult Has patient had a PCN reaction occurring within the last 10 years: yes If all of the above answers are "NO", then may proceed with Cephalosporin use.   . Sulfa Antibiotics Rash     Physical Exam:   Vitals  Blood pressure 142/85, pulse 89, temperature 97.6 F (36.4 C), temperature source Oral, resp. rate 18, height 5\' 5"  (1.651 m), weight 58.514 kg (129 lb), SpO2 94 %.   1. General  lying in bed in NAD,   2. Normal affect and insight, Not Suicidal or Homicidal, Awake Alert, Oriented X 3.  3. No F.N deficits, ALL C.Nerves Intact, Strength 5/5 all 4 extremities, Sensation intact all 4 extremities, Plantars down going.  4. Ears and Eyes appear Normal, Conjunctivae clear, PERRLA. Moist Oral Mucosa.  5. Supple Neck, No JVD, No cervical lymphadenopathy appriciated, No Carotid Bruits.  6. Symmetrical Chest wall movement, Good air movement bilaterally, CTAB.  7. RRR, No Gallops, Rubs or Murmurs, No Parasternal Heave.  8. Positive Bowel Sounds, Abdomen Soft, No tenderness, No organomegaly appriciated,No rebound -guarding or rigidity.  9.  No Cyanosis, Normal Skin Turgor, No Skin Rash or Bruise.  10. Good muscle tone,  joints appear normal , no effusions, Normal ROM.  11. No Palpable Lymph Nodes in Neck or Axillae     Data Review:    CBC  Recent Labs Lab 02/04/16 1255  WBC 17.4*  HGB 11.4*  HCT 34.4*  PLT 402*  MCV 86.4  MCH 28.6  MCHC 33.1  RDW 14.8   ------------------------------------------------------------------------------------------------------------------  Chemistries   Recent Labs Lab 02/04/16 1255  NA 139  K 3.2*  CL 107  CO2 20*  GLUCOSE 174*  BUN 22*  CREATININE 1.16*  CALCIUM 8.8*  AST 26  ALT 17  ALKPHOS 60  BILITOT 0.8    ------------------------------------------------------------------------------------------------------------------ estimated creatinine clearance is 35.4 mL/min (by C-G formula based on Cr of 1.16). ------------------------------------------------------------------------------------------------------------------ No results for input(s): TSH, T4TOTAL, T3FREE, THYROIDAB in the last 72 hours.  Invalid input(s): FREET3  Coagulation profile No results for input(s): INR, PROTIME in the last 168  hours. ------------------------------------------------------------------------------------------------------------------- No results for input(s): DDIMER in the last 72 hours. -------------------------------------------------------------------------------------------------------------------  Cardiac Enzymes No results for input(s): CKMB, TROPONINI, MYOGLOBIN in the last 168 hours.  Invalid input(s): CK ------------------------------------------------------------------------------------------------------------------ No results found for: BNP   ---------------------------------------------------------------------------------------------------------------  Urinalysis    Component Value Date/Time   COLORURINE YELLOW 02/04/2016 [place] 02/04/2016 1459   LABSPEC 1.019 02/04/2016 1459   PHURINE 5.5 02/04/2016 1459   GLUCOSEU NEGATIVE 02/04/2016 1459   HGBUR NEGATIVE 02/04/2016 1459   BILIRUBINUR NEGATIVE 02/04/2016 1459   KETONESUR NEGATIVE 02/04/2016 1459   PROTEINUR 30* 02/04/2016 1459   UROBILINOGEN 0.2 09/01/2014 0300   NITRITE NEGATIVE 02/04/2016 1459   LEUKOCYTESUR TRACE* 02/04/2016 1459    ----------------------------------------------------------------------------------------------------------------   Imaging Results:    Dg Abd Acute W/chest  02/04/2016  CLINICAL DATA:  Sudden onset of mid abdominal pain and back pain starting this morning, vomiting, diarrhea EXAM:  DG ABDOMEN ACUTE W/ 1V CHEST COMPARISON:  09/03/2014 FINDINGS: Cardiomediastinal silhouette is unremarkable. No acute infiltrate or pleural effusion. No pulmonary edema. There is normal small bowel gas pattern. Moderate stool noted in right colon. Degenerative changes are noted lower lumbar spine. No evidence of free abdominal air. IMPRESSION: No acute disease. Moderate stool noted in right colon. No free abdominal air. Normal small bowel gas pattern. Electronically Signed   By: Lahoma Crocker M.D.   On: 02/04/2016 15:18       Assessment & Plan:    Active Problems:   Vomiting   Renal mass, left   Gastroenteritis   Diarrhea    1. Abdominal pain LLQ Check CT scan abd/ pelvis r/o diverticulitis Start on Cipro, Flagyl iv  2.  Diarrhea Stool studies  3. L renal mass  CT scan to evaluate   4. N/v Zofran, protonix Trop i  5. Anemia Check cbc in am,    6. Hypokalemia Replete Check cmp in am  7. Dm2 fsbs ac and qhs.  iss  8. Cardiac murmer Check cardiac echo  DVT Prophylaxis  Lovenox - SCDs   AM Labs Ordered, also please review Full Orders  Family Communication: Admission, patients condition and plan of care including tests being ordered have been discussed with the patient  who indicate understanding and agree with the plan and Code Status.  Code Status FULL CODE  Likely DC to  home  Condition GUARDED    Consults called:   Admission status: inpatient  Time spent in minutes :45 minutes   Jani Gravel M.D on 02/04/2016 at 4:27 PM  Between 7am to 7pm - Pager - [phone]. After 7pm go to [email or web address] - password [facility]  [facility] - Office  [phone]

## 2016-02-05 DIAGNOSIS — E43 Unspecified severe protein-calorie malnutrition: Secondary | ICD-10-CM | POA: Diagnosis present

## 2016-02-05 DIAGNOSIS — R58 Hemorrhage, not elsewhere classified: Principal | ICD-10-CM

## 2016-02-05 LAB — CBC WITH DIFFERENTIAL/PLATELET
Basophils Absolute: 0 K/uL (ref 0.0–0.1)
Basophils Relative: 0 %
Eosinophils Absolute: 0.1 K/uL (ref 0.0–0.7)
Eosinophils Relative: 1 %
HCT: 27 % — ABNORMAL LOW (ref 36.0–46.0)
Hemoglobin: 9 g/dL — ABNORMAL LOW (ref 12.0–15.0)
Lymphocytes Relative: 21 %
Lymphs Abs: 2.4 K/uL (ref 0.7–4.0)
MCH: 28.8 pg (ref 26.0–34.0)
MCHC: 33.3 g/dL (ref 30.0–36.0)
MCV: 86.3 fL (ref 78.0–100.0)
Monocytes Absolute: 0.8 K/uL (ref 0.1–1.0)
Monocytes Relative: 6 %
Neutro Abs: 8.4 K/uL — ABNORMAL HIGH (ref 1.7–7.7)
Neutrophils Relative %: 72 %
Platelets: 306 K/uL (ref 150–400)
RBC: 3.13 MIL/uL — ABNORMAL LOW (ref 3.87–5.11)
RDW: 15.1 % (ref 11.5–15.5)
WBC: 11.7 K/uL — ABNORMAL HIGH (ref 4.0–10.5)

## 2016-02-05 LAB — GLUCOSE, CAPILLARY
Glucose-Capillary: 103 mg/dL — ABNORMAL HIGH (ref 65–99)
Glucose-Capillary: 90 mg/dL (ref 65–99)
Glucose-Capillary: 99 mg/dL (ref 65–99)
Glucose-Capillary: 93 mg/dL (ref 65–99)

## 2016-02-05 LAB — CBC
HCT: 24.5 % — ABNORMAL LOW (ref 36.0–46.0)
Hemoglobin: 8.4 g/dL — ABNORMAL LOW (ref 12.0–15.0)
MCH: 29.3 pg (ref 26.0–34.0)
MCHC: 34.3 g/dL (ref 30.0–36.0)
MCV: 85.4 fL (ref 78.0–100.0)
Platelets: 261 10*3/uL (ref 150–400)
RBC: 2.87 MIL/uL — AB (ref 3.87–5.11)
RDW: 14.8 % (ref 11.5–15.5)
WBC: 11.3 10*3/uL — ABNORMAL HIGH (ref 4.0–10.5)

## 2016-02-05 LAB — PROTIME-INR
INR: 1.17 (ref 0.00–1.49)
Prothrombin Time: 15.1 s (ref 11.6–15.2)

## 2016-02-05 LAB — COMPREHENSIVE METABOLIC PANEL
ALBUMIN: 3.3 g/dL — AB (ref 3.5–5.0)
ALT: 14 U/L (ref 14–54)
ANION GAP: 6 (ref 5–15)
AST: 18 U/L (ref 15–41)
Alkaline Phosphatase: 47 U/L (ref 38–126)
BUN: 21 mg/dL — ABNORMAL HIGH (ref 6–20)
CALCIUM: 8.1 mg/dL — AB (ref 8.9–10.3)
CHLORIDE: 112 mmol/L — AB (ref 101–111)
CO2: 21 mmol/L — AB (ref 22–32)
Creatinine, Ser: 0.89 mg/dL (ref 0.44–1.00)
GFR calc non Af Amer: >60 mL/min (ref >60)
GLUCOSE: 102 mg/dL — AB (ref 65–99)
POTASSIUM: 3.5 mmol/L (ref 3.5–5.1)
SODIUM: 139 mmol/L (ref 135–145)
Total Bilirubin: 0.6 mg/dL (ref 0.3–1.2)
Total Protein: 5.5 g/dL — ABNORMAL LOW (ref 6.5–8.1)

## 2016-02-05 LAB — APTT: APTT: 25 s (ref 24–37)

## 2016-02-05 LAB — PREPARE RBC (CROSSMATCH)

## 2016-02-05 LAB — ABO/RH: ABO/RH(D): AB POS

## 2016-02-05 LAB — HEMOGLOBIN AND HEMATOCRIT, BLOOD
HCT: 23.1 % — ABNORMAL LOW (ref 36.0–46.0)
Hemoglobin: 7.8 g/dL — ABNORMAL LOW (ref 12.0–15.0)

## 2016-02-05 MED ORDER — PREMIER PROTEIN SHAKE
11.0000 [oz_av] | Freq: Two times a day (BID) | ORAL | Status: DC
  Administered 2016-02-05 – 2016-02-06 (×2): 11 [oz_av] via ORAL
  Filled 2016-02-05 (×2): qty 325.31

## 2016-02-05 MED ORDER — SODIUM CHLORIDE 0.9 % IV SOLN: Freq: Once | INTRAVENOUS | Status: DC

## 2016-02-05 NOTE — Consult Note (Signed)
Chief Complaint: Extraperitoneal hemorrhage  Referring Physician(s): Dr. Gennaro Africa  Supervising Physician: Corrie Mckusick  Patient Status: Inpatient   PCP: Dr. Glendale Chard  Urology Consult: Dr. Risa Grill  History of Present Illness: Anna Hoffman is a 81 y.o. female presenting through the ED at [facility] for abdominal pain, N/V/D on Monday, June 3rd.  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    She tells me that her pain started on her left flank on Sunday while she was cooking, but improved over the next 24 hours.  She then experienced severe episode of lower abd and left flank pain after breakfast on Monday morning.  She had associated diarrhea/nausea, and felt woozy with near-syncope.  She came to the ED for evaluation.   She denies any hematuria, hematochezia/melena, SOB, CP, neurologic symptoms, weakness.   She denies any fevers/rigors/chills.    Since her admission, she tells me she is feeling better, and her symptoms are improving.    Her vital signs since admission have shown no hypotension, and in fact, hypertension, with SBP 131-154.  Her HR has been in the 80-90 range, with the exception of her most recent recorded at 101 at 2:25pm.   Her Hgb on admission was 11.4, which is similar to 1 year prior at 11.6.  She has had a subsequent drop in the hgb after admission on serial H&H, with the most recent at 9am today of 9.0, which is increased/stable from 3am = 8.4.   She tells me there were plans for a transfusion, but she has not received any blood products.     Social history: She lives with 1 of her sons.  Her other son lives in [place].  She is a widow.  She is very active, participating at the [facility] [employer] [facility], and currently has her own baking company.  She has worked in the past for [employer] and also for [employer].  She is a never smoker, never drinker.     Past Medical History    Diagnosis Date  . Hypertension   . Diabetes mellitus without complication ([place])   . Hypokalemia     Past Surgical History  Procedure Laterality Date  . Abdominal hysterectomy    . Knee surgery    . Esophagogastroduodenoscopy N/A 09/01/2014    Procedure: ESOPHAGOGASTRODUODENOSCOPY (EGD);  Surgeon: Lear Ng, MD;  Location: [facility];  Service: Endoscopy;  Laterality: N/A;  . Hiatal hernia repair N/A 09/04/2014    Procedure: LAPAROSCOPIC REPAIR OF HIATAL HERNIA;  Surgeon: Excell Seltzer, MD;  Location: [facility];  Service: General;  Laterality: N/A;  With MESH    Allergies: Amoxicillin and Sulfa antibiotics  Medications: Prior to Admission medications   Medication Sig Start Date End Date Taking? Authorizing Provider  Ascorbic Acid (VITAMIN C) 1000 MG tablet Take 1,000 mg by mouth daily.   Yes Historical Provider, MD  aspirin 81 MG tablet Take 81 mg by mouth daily.   Yes Historical Provider, MD  fexofenadine (ALLEGRA) 180 MG tablet Take 180 mg by mouth daily.   Yes Historical Provider, MD  ibuprofen (ADVIL,MOTRIN) 200 MG tablet Take 400 mg by mouth every 8 (eight) hours as needed for pain.   Yes Historical Provider, MD  Multiple Vitamins-Minerals (STRESS TAB NF PO) Take 1 tablet by mouth daily.   Yes Historical Provider, MD  Olmesartan-Amlodipine-HCTZ (TRIBENZOR) 20-5-12.5 MG TABS Take 1 tablet by mouth  daily.   Yes Historical Provider, MD  oxyCODONE (ROXICODONE) 5 MG/5ML solution Take 5-10 mLs (5-10 mg total) by mouth every 4 (four) hours as needed. Patient taking differently: Take 5-10 mg by mouth every 4 (four) hours as needed for severe pain.  09/06/14  Yes Excell Seltzer, MD  rosuvastatin (CRESTOR) 10 MG tablet Take 10 mg by mouth daily.   Yes Historical Provider, MD  vitamin B-12 (CYANOCOBALAMIN) 1000 MCG tablet Take 1,000 mcg by mouth daily.   Yes Historical Provider, MD     Family History  Problem Relation Age of Onset  . Prostate cancer Father     Social  History   Social History  . Marital Status: Widowed    Spouse Name: N/A  . Number of Children: N/A  . Years of Education: N/A   Social History Main Topics  . Smoking status: Never Smoker   . Smokeless tobacco: None  . Alcohol Use: No  . Drug Use: No  . Sexual Activity: No   Other Topics Concern  . None   Social History Narrative      Review of Systems: A 12 point ROS discussed and pertinent positives are indicated in the HPI above.  All other systems are negative.  Review of Systems  Vital Signs: BP 131/69 mmHg  Pulse 101  Temp(Src) 98.1 F (36.7 C) (Oral)  Resp 18  Ht 5\' 5"  (1.651 m)  Wt 131 lb (59.421 kg)  BMI 21.80 kg/m2  SpO2 97%  Physical Exam  Atraumatic, normocephalic.   Mucous membranes moist and pink. Conjugate gaze.  No scleral icterus.  No scleral injection.  Full ROM of the cervical region.  No adenopathy.  Symmetric excursion of the chest on inspiration and expiration.  No labored breathing.  Abd soft, without rigidity/peritoneal signs.  Mild TTP at the left LQ/flank.   GU deferred.  No swelling of the lower extremity.  No palpable pulsatile abd mass.  Palpable radial pulses bilateral.  Bilateral PT and DP pulses. No LE wounds.   Mallampati Score:  2  Imaging: Ct Abdomen Pelvis W Contrast  02/04/2016  ADDENDUM REPORT: 02/04/2016 21:19 ADDENDUM: Critical Value/emergent results were called by telephone at the time of interpretation on 02/04/2016 at 9:13 pm to Dr. Hilbert Bible , who verbally acknowledged these results. Electronically Signed   By: Nelson Chimes M.D.   On: 02/04/2016 21:19  02/04/2016  CLINICAL DATA:  Nausea and vomiting beginning today. Diarrhea and loose stool. History of paraesophageal hernia. Pain most severe in the left lower quadrant. EXAM: CT ABDOMEN AND PELVIS WITH CONTRAST TECHNIQUE: Multidetector CT imaging of the abdomen and pelvis was performed using the standard protocol following bolus administration of intravenous contrast.  CONTRAST:  69mL ISOVUE-300 IOPAMIDOL (ISOVUE-300) INJECTION 61% COMPARISON:  Radiography same day. CT 09/01/2014 abdominal MRI 05/28/2005 FINDINGS: There is a large acute retroperitoneal hemorrhage on the left apparently arising from a heterogeneous 3.5 x 4.5 cm mass of the left upper pole ventral kidney with prominent fat components. Most likely diagnosis is angiomyolipoma, as I believe there was a lesion present in this area in 2006, making renal cell carcinoma quite unlikely. Most of the hemorrhage is retroperitoneal, but there is probably a small amount of intraperitoneal hemorrhage surrounding the spleen and a moderate amount in the pelvic cul de sac. Lung bases are clear. There is aortic atherosclerosis and coronary artery calcification. The liver is normal. No calcified gallstones. No intrinsic splenic lesion. No intrinsic pancreatic lesion. The right kidney shows atrophy and arterial  calcifications. In addition to the findings above, the left kidney contains a 1 cm cyst in the lower pole. Abdominal aortic atherosclerosis is present without aneurysm. The IVC is normal. There has been previous hysterectomy. No pelvic mass. The bladder is unremarkable. Small inguinal hernia on the left. Small hiatal hernia. No primary bowel pathology is identified. There is advanced degenerative disease of the lumbar spine including chronic anterolisthesis at L5-S1 and S1-S2. 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 This is estimated at about 3.5 x 4.5 cm in size. Retroperitoneal hemorrhage does show intraperitoneal extension in the left upper quadrant around the spleen and in the pelvic cul de sac. Where are in the process of emergency call report but have not successfully located the covering physician at this time. Aortic atherosclerosis.  Coronary artery calcifications. Electronically Signed: By: Nelson Chimes M.D. On: 02/04/2016 21:12   Dg Abd  Acute W/chest  02/04/2016  CLINICAL DATA:  Sudden onset of mid abdominal pain and back pain starting this morning, vomiting, diarrhea EXAM: DG ABDOMEN ACUTE W/ 1V CHEST COMPARISON:  09/03/2014 FINDINGS: Cardiomediastinal silhouette is unremarkable. No acute infiltrate or pleural effusion. No pulmonary edema. There is normal small bowel gas pattern. Moderate stool noted in right colon. Degenerative changes are noted lower lumbar spine. No evidence of free abdominal air. IMPRESSION: No acute disease. Moderate stool noted in right colon. No free abdominal air. Normal small bowel gas pattern. Electronically Signed   By: Lahoma Crocker M.D.   On: 02/04/2016 15:18    Labs:  CBC:  Recent Labs  02/04/16 1255 02/04/16 2158 02/05/16 0312 02/05/16 0858 02/05/16 1721  WBC 17.4* 14.2* 11.3* 11.7*  --   HGB 11.4* 9.0* 8.4* 9.0* 7.8*  HCT 34.4* 26.8* 24.5* 27.0* 23.1*  PLT 402* 302 261 306  --     COAGS:  Recent Labs  02/05/16 0858  INR 1.17  APTT 25    BMP:  Recent Labs  02/04/16 1255 02/05/16 0312  NA 139 139  K 3.2* 3.5  CL 107 112*  CO2 20* 21*  GLUCOSE 174* 102*  BUN 22* 21*  CALCIUM 8.8* 8.1*  CREATININE 1.16* 0.89  GFRNONAA 44* >60  GFRAA 51* >60    LIVER FUNCTION TESTS:  Recent Labs  02/04/16 1255 02/05/16 0312  BILITOT 0.8 0.6  AST 26 18  ALT 17 14  ALKPHOS 60 47  PROT 6.4* 5.5*  ALBUMIN 3.9 3.3*    TUMOR MARKERS: No results for input(s): AFPTM, CEA, CA199, CHROMGRNA in the last 8760 hours.  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    Currently, I feel she has no ongoing signs of hemorrhage, as her Hgb has stabilized, she has no hypotension, and her symptoms of abd pain and flank pain are improving. I agree with medical/surgical team that observation is reasonable. Agree with Urology that future possible treatment of her tumor would include both nephron-sparing surgery or  embolization.    I discussed the imaging diagnosis with Anna Hoffman, as well as the small chance of a recurrent bleed, which might necessitate an urgent or emergent embolization.    For now, VIR would agree with current care and serial H&H.    We will follow admission and are available on-call should her clinical status change.     Thank you for this interesting consult.  I greatly enjoyed meeting Anna [place]  Hoffman and look forward to participating in their care.  A copy of this report was sent to the requesting provider on this date.  Electronically Signed: Corrie Mckusick 02/05/2016, 8:42 PM   I spent a total of 40 Minutes    in face to face in clinical consultation, greater than 50% of which was counseling/coordinating care for extraperitoneal hemorrhage, likely left AML, and possible angiogram and embolization.

## 2016-02-05 NOTE — Progress Notes (Signed)
Date:  February 05, 2016 Chart reviewed for concurrent status and case management needs. Will continue to follow the patient for changes and needs:  Good rx card given to patient/states that she is alos working on getting medicare part d added to her plans. Velva Harman, BSN, [place], [place]

## 2016-02-05 NOTE — Progress Notes (Signed)
Initial Nutrition Assessment  DOCUMENTATION CODES:   Severe malnutrition in context of chronic illness  INTERVENTION:  - Will order Premier Protein BID, each supplement provides 160 kcal and 30 grams of protein. - Continue to encourage PO intakes of meals and supplements. - RD will provide diet education prior to d/c if warranted at that time.  NUTRITION DIAGNOSIS:   Inadequate oral intake related to acute illness, nausea, vomiting as evidenced by per patient/family report.  GOAL:   Patient will meet greater than or equal to 90% of their needs  MONITOR:   PO intake, Supplement acceptance, Weight trends, Labs, I & O's  REASON FOR ASSESSMENT:   Malnutrition Screening Tool  ASSESSMENT:   81 y.o. female, w hx of paraesophageal hernia, apparently c/o n/v starting this am. Pt also notes diarreah, loose stool. Starting this am as well. Pt also notes left lower quadrant pain. Starting this am. Denies fever, chills, brbpr, black stool.   Pt seen for MST. BMI indicates normal weight. Pt reports that she ate a few bites of scrambled eggs and of blueberry muffin this AM and reports some nausea and spitting up saliva following intakes. She states that she attempted to take medications with cranberry juice which exacerbated nausea but switching to water helped. Pt denies overt emesis since yesterday and states last BM was yesterday.  She indicates that she began to have N/V/D, abdominal pain, and severe dizziness following intake of coffee, a piece of bacon, and toast yesterday AM. Pt reports that she had surgery on 09/05/15 and since that time has been having intermittent N/V with PO intakes. She states that she enjoys fruits and vegetables but has been having diarrhea with pears and apples since surgery. Pt reports that MD recently informed her that she has diverticulitis and has began discussion with her about diet changes needed for this; RD informed pt that education will be provided prior  to d/c once more is known.  Pt states that PTA she was very active and often exercised at the [facility]. She states that since surgery on 09/05/15 she lost 30 lbs. No recent weight hx available in chart so based on pt report and CBW, she has lost 19% body weight in the past 5 months which is significant for time frame. Physical assessment indicates severe muscle wasting to upper body, specifically around clavicles, mild fat wasting.  Talked with pt about nutrition supplements. She has tried Boost and Ensure in the past and states they are too sweet for her. Talked with her about Premier Protein shakes and she is interested/willing to try this supplement; will order BID and adjust as needed.  Per NP note yesterday at 2145: CT reveals large acute (L) retroperitoneal hemorrhage secondary to an angiomyolipoma of the (L) kidney.  Pt likely not meeting needs PTA. Medications reviewed; sliding scale Novolog, PRN IV Zofran, 40 mg Protonix/day, 1000 mcg oral B12/day. Labs reviewed; CBG: 103 mg/dL today, Cl: 112 mmol/L, Ca: 8.1 mg/dL, BUN: 21 mg/dL. IVF: NS-20 mEq KCl @ 75 mL/hr.      Diet Order:  Diet Heart Room service appropriate?: Yes; Fluid consistency:: Thin  Skin:  Reviewed, no issues  Last BM:  7/3  Height:   Ht Readings from Last 1 Encounters:  02/04/16 5\' 5"  (1.651 m)    Weight:   Wt Readings from Last 1 Encounters:  02/05/16 131 lb (59.421 kg)    Ideal Body Weight:  56.82 kg (kg)  BMI:  Body mass index is 21.8 kg/(m^2).  Estimated Nutritional Needs:   Kcal:  1700-1900  Protein:  70-80 grams  Fluid:  >/= 1.7 L/day  EDUCATION NEEDS:   No education needs identified at this time     Jarome Matin, MS, RD, LDN Inpatient Clinical Dietitian Pager # [phone] After hours/weekend pager # [phone]

## 2016-02-05 NOTE — Progress Notes (Signed)
Date:  February 05, 2016 Chart reviewed for concurrent status and case management needs. Will continue to follow the patient for changes and needs:  Patient has trouble affording her bp meds and her crestor will see what patient assistance programs are available  Velva Harman, BSN, [place], [place]

## 2016-02-05 NOTE — Progress Notes (Signed)
History and Physical  Anna Hoffman [id number] DOB: 23-Jul-1935 DOA: 02/04/2016  Subjective: Patient said she continues to have abdominal pain, L>>R. She felt a little dizzy when she got up this morning. No CP/dyspnea. No vomiting or diarrhea but she is a little nauseated. No hematuria. Last BM was last night.   Physical Exam: BP 141/75 mmHg  Pulse 96  Temp(Src) 98.1 F (36.7 C) (Oral)  Resp 20  Ht 5\' 5"  (1.651 m)  Wt 59.421 kg (131 lb)  BMI 21.80 kg/m2  SpO2 96%  GENERAL :   Alert and cooperative, and appears to be in no acute distress. HEAD:           normocephalic. EYES:            PERRL, EOMI.  vision is grossly intact. THROAT:     Oral cavity and pharynx normal.   NECK:          supple, non-tender.  CARDIAC:    Normal S1 and S2. No gallop. No murmurs.  Vascular:     no peripheral edema.  LUNGS:       Clear to auscultation  ABDOMEN: Positive bowel sounds. Soft, nondistended, LLQ tenderness. No guarding or rebound.      MSK:           No joint erythema or tenderness.  EXT           : No significant deformity or joint abnormality. Neuro        : Alert, oriented to person, place, and time.                      CN II-XII intact.  SKIN:            No rash. No lesions. PSYCH:       No hallucination. Patient is not suicidal.          Labs on Admission:  Reviewed.   Radiological Exams on Admission: Ct Abdomen Pelvis W Contrast  02/04/2016  ADDENDUM REPORT: 02/04/2016 21:19 ADDENDUM: Critical Value/emergent results were called by telephone at the time of interpretation on 02/04/2016 at 9:13 pm to Dr. Hilbert Bible , who verbally acknowledged these results. Electronically Signed   By: Nelson Chimes M.D.   On: 02/04/2016 21:19  02/04/2016  CLINICAL DATA:  Nausea and vomiting beginning today. Diarrhea and loose stool. History of paraesophageal hernia. Pain most severe in the left lower quadrant. EXAM: CT ABDOMEN AND PELVIS WITH CONTRAST TECHNIQUE: Multidetector CT imaging of the  abdomen and pelvis was performed using the standard protocol following bolus administration of intravenous contrast. CONTRAST:  16mL ISOVUE-300 IOPAMIDOL (ISOVUE-300) INJECTION 61% COMPARISON:  Radiography same day. CT 09/01/2014 abdominal MRI 05/28/2005 FINDINGS: There is a large acute retroperitoneal hemorrhage on the left apparently arising from a heterogeneous 3.5 x 4.5 cm mass of the left upper pole ventral kidney with prominent fat components. Most likely diagnosis is angiomyolipoma, as I believe there was a lesion present in this area in 2006, making renal cell carcinoma quite unlikely. Most of the hemorrhage is retroperitoneal, but there is probably a small amount of intraperitoneal hemorrhage surrounding the spleen and a moderate amount in the pelvic cul de sac. Lung bases are clear. There is aortic atherosclerosis and coronary artery calcification. The liver is normal. No calcified gallstones. No intrinsic splenic lesion. No intrinsic pancreatic lesion. The right kidney shows atrophy and arterial calcifications. In addition to the findings above, the left kidney contains a 1 cm cyst  in the lower pole. Abdominal aortic atherosclerosis is present without aneurysm. The IVC is normal. There has been previous hysterectomy. No pelvic mass. The bladder is unremarkable. Small inguinal hernia on the left. Small hiatal hernia. No primary bowel pathology is identified. There is advanced degenerative disease of the lumbar spine including chronic anterolisthesis at L5-S1 and S1-S2. 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 This is estimated at about 3.5 x 4.5 cm in size. Retroperitoneal hemorrhage does show intraperitoneal extension in the left upper quadrant around the spleen and in the pelvic cul de sac. Where are in the process of emergency call report but have not successfully located the covering physician at this time. Aortic  atherosclerosis.  Coronary artery calcifications. Electronically Signed: By: Nelson Chimes M.D. On: 02/04/2016 21:12   Dg Abd Acute W/chest  02/04/2016  CLINICAL DATA:  Sudden onset of mid abdominal pain and back pain starting this morning, vomiting, diarrhea EXAM: DG ABDOMEN ACUTE W/ 1V CHEST COMPARISON:  09/03/2014 FINDINGS: Cardiomediastinal silhouette is unremarkable. No acute infiltrate or pleural effusion. No pulmonary edema. There is normal small bowel gas pattern. Moderate stool noted in right colon. Degenerative changes are noted lower lumbar spine. No evidence of free abdominal air. IMPRESSION: No acute disease. Moderate stool noted in right colon. No free abdominal air. Normal small bowel gas pattern. Electronically Signed   By: Lahoma Crocker M.D.   On: 02/04/2016 15:18      Assessment/Plan  Spontaneous retroperitoneal hemorrhage: Causing abdominal pain, LLQ No diarrhea, no evidence of diverticulitis : dc abx. Cont to check H&H every 8 hours for now Appreciate Urology recs.   DMII: fsbs ac and qhs. iss  DVT Prophylaxis Lovenox - SCDs    Code Status FULL CODE   Condition GUARDED   Possible dc tomorrow if Hb is stable with outpatient f/u.    Gennaro Africa M.D [facility]

## 2016-02-05 NOTE — Progress Notes (Signed)
Follow up:  Notified by Dr Maree Erie w/ radiology service regarding results of pt's ct abd/pelvis. Ct reveals large acute (L) retroperitoneal hemorrhage secondary to an angiomyolipoma of the (L) kidney. Pt current resting in no acute distress w/ stable VS. Orders placed for stat repeat CBC and T&S. Discussed pt w/ Dr Risa Grill w/ urology service who has agreed to see pt in consultation. He has recommended serial CBC's q6-8h over the next 48 hrs. Please see consult note. Will continue to monitor closely on med-surg w/ low threshold to transfer to higher level of care should she become unstable.   Jeryl Columbia, NP-C [facility] Pager [phone]

## 2016-02-05 NOTE — Progress Notes (Signed)
No events overnight Hg stable at 9 +n/v Mild left flank pain controlled with tylenol  Filed Vitals:   02/04/16 1310 02/04/16 1732 02/04/16 2100 02/05/16 0548  BP:  144/89 154/79 141/75  Pulse: 89 94 88 96  Temp:  98.3 F (36.8 C) 98.3 F (36.8 C) 98.1 F (36.7 C)  TempSrc:  Oral Oral Oral  Resp:  20 20 20   Height:      Weight:    131 lb (59.421 kg)  SpO2: 94% 99% 98% 96%   NAD Soft nt nd No foley  CBC    Component Value Date/Time   WBC 11.7* 02/05/2016 0858   RBC 3.13* 02/05/2016 0858   HGB 9.0* 02/05/2016 0858   HCT 27.0* 02/05/2016 0858   PLT 306 02/05/2016 0858   MCV 86.3 02/05/2016 0858   MCH 28.8 02/05/2016 0858   MCHC 33.3 02/05/2016 0858   RDW 15.1 02/05/2016 0858   LYMPHSABS 2.4 02/05/2016 0858   MONOABS 0.8 02/05/2016 0858   EOSABS 0.1 02/05/2016 0858   BASOSABS 0.0 02/05/2016 0858    BMP Latest Ref Rng 02/05/2016 02/04/2016 09/05/2014  Glucose 65 - 99 mg/dL 102(H) 174(H) 156(H)  BUN 6 - 20 mg/dL 21(H) 22(H) 12  Creatinine 0.44 - 1.00 mg/dL 0.89 1.16(H) 1.03  Sodium 135 - 145 mmol/L 139 139 135  Potassium 3.5 - 5.1 mmol/L 3.5 3.2(L) 4.5  Chloride 101 - 111 mmol/L 112(H) 107 104  CO2 22 - 32 mmol/L 21(L) 20(L) 24  Calcium 8.9 - 10.3 mg/dL 8.1(L) 8.8(L) 8.7    A/P: 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 Hg/vitals stable -continue to trend Hg q6hr -No intervention indicated as long as Hg/vitals stable -if vitals become unstable or significant drop in Hg, will need IR consult for embolization

## 2016-02-06 ENCOUNTER — Inpatient Hospital Stay (HOSPITAL_COMMUNITY): Payer: Medicare Other

## 2016-02-06 DIAGNOSIS — N2889 Other specified disorders of kidney and ureter: Secondary | ICD-10-CM

## 2016-02-06 DIAGNOSIS — R112 Nausea with vomiting, unspecified: Secondary | ICD-10-CM

## 2016-02-06 DIAGNOSIS — R109 Unspecified abdominal pain: Secondary | ICD-10-CM | POA: Diagnosis present

## 2016-02-06 DIAGNOSIS — K661 Hemoperitoneum: Secondary | ICD-10-CM

## 2016-02-06 DIAGNOSIS — R1084 Generalized abdominal pain: Secondary | ICD-10-CM | POA: Diagnosis present

## 2016-02-06 DIAGNOSIS — E43 Unspecified severe protein-calorie malnutrition: Secondary | ICD-10-CM

## 2016-02-06 DIAGNOSIS — R012 Other cardiac sounds: Secondary | ICD-10-CM

## 2016-02-06 DIAGNOSIS — E876 Hypokalemia: Secondary | ICD-10-CM

## 2016-02-06 LAB — ECHOCARDIOGRAM COMPLETE
AVLVOTPG: 7 mmHg
CHL CUP DOP CALC LVOT VTI: 27 cm
CHL CUP LVOT MV VTI: 1.89
E decel time: 132 msec
EERAT: 18.14
FS: 35 % (ref 28–44)
Height: 65 in
IVS/LV PW RATIO, ED: 1.84
LA ID, A-P, ES: 41 mm
LA vol A4C: 57.9 ml
LA vol index: 33 mL/m2
LA vol: 54.6 mL
LADIAMINDEX: 2.48 cm/m2
LDCA: 2.27 cm2
LEFT ATRIUM END SYS DIAM: 41 mm
LV E/e' medial: 18.14
LV PW d: 10.3 mm — AB (ref 0.6–1.1)
LV TDI E'MEDIAL: 4.03
LVEEAVG: 18.14
LVOT MV VTI INDEX: 1.14 cm2/m2
LVOT diameter: 17 mm
LVOT peak vel: 129 cm/s
LVOTSV: 61 mL
MV Dec: 132
MV M vel: 107
MV pk E vel: 73.1 m/s
MVANNULUSVTI: 32.5 cm
MVPG: 2 mmHg
MVPKAVEL: 127 m/s
Mean grad: 6 mmHg
TAPSE: 20.7 mm
Weight: 2096.03 oz

## 2016-02-06 LAB — TYPE AND SCREEN
ABO/RH(D): AB POS
Antibody Screen: NEGATIVE
Unit division: 0

## 2016-02-06 LAB — CBC WITH DIFFERENTIAL/PLATELET
BASOS ABS: 0 10*3/uL (ref 0.0–0.1)
Basophils Relative: 0 %
Eosinophils Absolute: 0.2 10*3/uL (ref 0.0–0.7)
Eosinophils Relative: 2 %
HEMATOCRIT: 26 % — AB (ref 36.0–46.0)
HEMOGLOBIN: 8.9 g/dL — AB (ref 12.0–15.0)
LYMPHS ABS: 2.4 10*3/uL (ref 0.7–4.0)
LYMPHS PCT: 25 %
MCH: 29.1 pg (ref 26.0–34.0)
MCHC: 34.2 g/dL (ref 30.0–36.0)
MCV: 85 fL (ref 78.0–100.0)
Monocytes Absolute: 0.7 10*3/uL (ref 0.1–1.0)
Monocytes Relative: 8 %
NEUTROS ABS: 6.2 10*3/uL (ref 1.7–7.7)
Neutrophils Relative %: 65 %
Platelets: 250 10*3/uL (ref 150–400)
RBC: 3.06 MIL/uL — AB (ref 3.87–5.11)
RDW: 15.4 % (ref 11.5–15.5)
WBC: 9.6 10*3/uL (ref 4.0–10.5)

## 2016-02-06 LAB — HEMOGLOBIN AND HEMATOCRIT, BLOOD
HCT: 32.1 % — ABNORMAL LOW (ref 36.0–46.0)
HEMATOCRIT: 29.5 % — AB (ref 36.0–46.0)
HEMOGLOBIN: 10.1 g/dL — AB (ref 12.0–15.0)
HEMOGLOBIN: 11.1 g/dL — AB (ref 12.0–15.0)

## 2016-02-06 LAB — GLUCOSE, CAPILLARY
GLUCOSE-CAPILLARY: 97 mg/dL (ref 65–99)
Glucose-Capillary: 82 mg/dL (ref 65–99)
Glucose-Capillary: 95 mg/dL (ref 65–99)
Glucose-Capillary: 125 mg/dL — ABNORMAL HIGH (ref 65–99)

## 2016-02-06 LAB — HEMOGLOBIN A1C
HEMOGLOBIN A1C: 6.1 % — AB (ref 4.8–5.6)
Mean Plasma Glucose: 128 mg/dL

## 2016-02-06 MED ORDER — HYOSCYAMINE SULFATE 0.125 MG SL SUBL
0.2500 mg | SUBLINGUAL_TABLET | Freq: Once | SUBLINGUAL | Status: AC
  Administered 2016-02-06: 0.25 mg via SUBLINGUAL
  Filled 2016-02-06: qty 2

## 2016-02-06 MED ORDER — HYOSCYAMINE SULFATE 0.125 MG SL SUBL
0.2500 mg | SUBLINGUAL_TABLET | SUBLINGUAL | Status: DC | PRN
  Administered 2016-02-07: 0.25 mg via SUBLINGUAL
  Filled 2016-02-06 (×2): qty 2

## 2016-02-06 NOTE — Progress Notes (Signed)
PROGRESS NOTE    Anna Hoffman  [id number]  DOB: [birth year]  DOA: 02/04/2016 PCP: Anna Greenland, MD Outpatient Specialists:   Hospital course: Anna Hoffman is a 81 y.o. female presenting through the ED at [facility] for abdominal pain, N/V/D on Monday, June 3rd. 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  Pt followed by urology and I.R.   Assessment & Plan:   1. Left AML with extraperitoneal hemorrhage - Pt had drop in Hg and was given 1 unit PRBC overnight.  Hemoglobin is 8.9 this morning.  Pt still complaining of left flank and abdominal pain but ambulating well.  Awaiting urology recommendations.  Appreciate IR consult.  Hemodynamics have been stable.   Following Hg.   2. Type 2 Diabetes Mellitus - blood sugars have been stable. Following.  Continue current management.  3. Anemia - secondary to acute blood loss - Pt is s/p 1 unit PRBC. Following.  4. Hypokalemia - repleted.      DVT prophylaxis: SCDs Code Status: Full Family Communication: Pt updated at bedside Disposition Plan: Pending consultants recommendations   Consultants:  Urology  I.R.    Subjective: Pt complaining of left flank pain, pt tolerated blood transfusion, eating and ambulating in room.   Objective: Filed Vitals:   02/05/16 2130 02/05/16 2352 02/06/16 0500 02/06/16 0549  BP: 159/79 164/80  179/86  Pulse: 99 89  96  Temp: 98.7 F (37.1 C) 99.2 F (37.3 C)  98.4 F (36.9 C)  TempSrc: Oral Oral  Oral  Resp: 18 19  18   Height:      Weight:   131 lb (59.422 kg)   SpO2: 100% 90%  97%    Intake/Output Summary (Last 24 hours) at 02/06/16 0758 Last data filed at 02/05/16 2352  Gross per 24 hour  Intake    855 ml  Output    800 ml  Net     55 ml   Filed Weights   02/04/16 1303 02/05/16 0548 02/06/16 0500  Weight: 129 lb (58.514 kg) 131 lb (59.421 kg) 131 lb (59.422 kg)    Exam:  General exam: awake,  alert, no distress, cooperative.  Respiratory system: Clear. No increased work of breathing. Cardiovascular system: S1 & S2 heard, RRR. No JVD, murmurs, gallops, clicks or pedal edema. Gastrointestinal system: Abdomen is nondistended, soft and mildly tender left flank, LLQ, no guarding. Normal bowel sounds heard. Central nervous system: Alert and oriented. No focal neurological deficits. Extremities: no cyanosis or clubbing.   Data Reviewed: Basic Metabolic Panel:  Recent Labs Lab 02/04/16 1255 02/05/16 0312  NA 139 139  K 3.2* 3.5  CL 107 112*  CO2 20* 21*  GLUCOSE 174* 102*  BUN 22* 21*  CREATININE 1.16* 0.89  CALCIUM 8.8* 8.1*   Liver Function Tests:  Recent Labs Lab 02/04/16 1255 02/05/16 0312  AST 26 18  ALT 17 14  ALKPHOS 60 47  BILITOT 0.8 0.6  PROT 6.4* 5.5*  ALBUMIN 3.9 3.3*    Recent Labs Lab 02/04/16 1255  LIPASE 16   No results for input(s): AMMONIA in the last 168 hours. CBC:  Recent Labs Lab 02/04/16 1255 02/04/16 2158 02/05/16 0312 02/05/16 0858 02/05/16 1721 02/06/16 0101  WBC 17.4* 14.2* 11.3* 11.7*  --  9.6  NEUTROABS  --   --   --  8.4*  --  6.2  HGB 11.4* 9.0* 8.4* 9.0*  7.8* 8.9*  HCT 34.4* 26.8* 24.5* 27.0* 23.1* 26.0*  MCV 86.4 83.5 85.4 86.3  --  85.0  PLT 402* 302 261 306  --  250   Cardiac Enzymes: No results for input(s): CKTOTAL, CKMB, CKMBINDEX, TROPONINI in the last 168 hours. BNP (last 3 results) No results for input(s): PROBNP in the last 8760 hours. CBG:  Recent Labs Lab 02/05/16 0811 02/05/16 1209 02/05/16 1743 02/05/16 2104 02/06/16 0749  GLUCAP 103* 90 99 93 97    Recent Results (from the past 240 hour(s))  C difficile quick scan w PCR reflex     Status: None   Collection Time: 02/04/16  2:27 PM  Result Value Ref Range Status   C Diff antigen NEGATIVE NEGATIVE Final   C Diff toxin NEGATIVE NEGATIVE Final   C Diff interpretation Negative for toxigenic C. difficile  Final     Studies: Ct Abdomen  Pelvis W Contrast  02/04/2016  ADDENDUM REPORT: 02/04/2016 21:19 ADDENDUM: Critical Value/emergent results were called by telephone at the time of interpretation on 02/04/2016 at 9:13 pm to Dr. Hilbert Bible , who verbally acknowledged these results. Electronically Signed   By: Nelson Chimes M.D.   On: 02/04/2016 21:19  02/04/2016  CLINICAL DATA:  Nausea and vomiting beginning today. Diarrhea and loose stool. History of paraesophageal hernia. Pain most severe in the left lower quadrant. EXAM: CT ABDOMEN AND PELVIS WITH CONTRAST TECHNIQUE: Multidetector CT imaging of the abdomen and pelvis was performed using the standard protocol following bolus administration of intravenous contrast. CONTRAST:  73mL ISOVUE-300 IOPAMIDOL (ISOVUE-300) INJECTION 61% COMPARISON:  Radiography same day. CT 09/01/2014 abdominal MRI 05/28/2005 FINDINGS: There is a large acute retroperitoneal hemorrhage on the left apparently arising from a heterogeneous 3.5 x 4.5 cm mass of the left upper pole ventral kidney with prominent fat components. Most likely diagnosis is angiomyolipoma, as I believe there was a lesion present in this area in 2006, making renal cell carcinoma quite unlikely. Most of the hemorrhage is retroperitoneal, but there is probably a small amount of intraperitoneal hemorrhage surrounding the spleen and a moderate amount in the pelvic cul de sac. Lung bases are clear. There is aortic atherosclerosis and coronary artery calcification. The liver is normal. No calcified gallstones. No intrinsic splenic lesion. No intrinsic pancreatic lesion. The right kidney shows atrophy and arterial calcifications. In addition to the findings above, the left kidney contains a 1 cm cyst in the lower pole. Abdominal aortic atherosclerosis is present without aneurysm. The IVC is normal. There has been previous hysterectomy. No pelvic mass. The bladder is unremarkable. Small inguinal hernia on the left. Small hiatal hernia. No primary bowel pathology is  identified. There is advanced degenerative disease of the lumbar spine including chronic anterolisthesis at L5-S1 and S1-S2. 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 This is estimated at about 3.5 x 4.5 cm in size. Retroperitoneal hemorrhage does show intraperitoneal extension in the left upper quadrant around the spleen and in the pelvic cul de sac. Where are in the process of emergency call report but have not successfully located the covering physician at this time. Aortic atherosclerosis.  Coronary artery calcifications. Electronically Signed: By: Nelson Chimes M.D. On: 02/04/2016 21:12   Dg Abd Acute W/chest  02/04/2016  CLINICAL DATA:  Sudden onset of mid abdominal pain and back pain starting this morning, vomiting, diarrhea EXAM: DG ABDOMEN ACUTE W/ 1V CHEST COMPARISON:  09/03/2014 FINDINGS: Cardiomediastinal silhouette is unremarkable. No  acute infiltrate or pleural effusion. No pulmonary edema. There is normal small bowel gas pattern. Moderate stool noted in right colon. Degenerative changes are noted lower lumbar spine. No evidence of free abdominal air. IMPRESSION: No acute disease. Moderate stool noted in right colon. No free abdominal air. Normal small bowel gas pattern. Electronically Signed   By: Lahoma Crocker M.D.   On: 02/04/2016 15:18     Scheduled Meds: . sodium chloride   Intravenous Once  . diatrizoate meglumine-sodium  30 mL Oral Once  . irbesartan  150 mg Oral Daily   And  . hydrochlorothiazide  12.5 mg Oral Daily  . insulin aspart  0-5 Units Subcutaneous QHS  . insulin aspart  0-9 Units Subcutaneous TID WC  . pantoprazole  40 mg Oral Daily  . protein supplement shake  11 oz Oral BID BM  . rosuvastatin  10 mg Oral Daily  . vitamin B-12  1,000 mcg Oral Daily   Continuous Infusions:   Active Problems:   Vomiting   Renal mass, left   Gastroenteritis   Diarrhea   Hypokalemia   Protein-calorie  malnutrition, severe   Time spent:    Irwin Brakeman, MD, FAAFP [facility] Pager [phone]  If 7PM-7AM, please contact night-coverage [email or web address] Password [email or web address] 02/06/2016, 7:58 AM    LOS: 2 days

## 2016-02-06 NOTE — Progress Notes (Signed)
  Echocardiogram 2D Echocardiogram has been performed.  Jennette Dubin 02/06/2016, 12:52 PM

## 2016-02-06 NOTE — Progress Notes (Signed)
Patient ID: Anna Hoffman, female   DOB: Jan 02, 1935, 81 y.o.   MRN: [id number]    Referring Physician(s): Hamad,A  Supervising Physician: Arne Cleveland  Patient Status:  Inpatient  Chief Complaint:  Extraperitoneal hemorrhage  Subjective:  Pt doing ok; did have N/V after lunch today; still has some lower pelvic discomfort but not as bad as yesterday; currently denies back pain; has received 1 unit PRBC's since yesterday; denies hematuria or bloody stools  Allergies: Amoxicillin and Sulfa antibiotics  Medications: Prior to Admission medications   Medication Sig Start Date End Date Taking? Authorizing Provider  Ascorbic Acid (VITAMIN C) 1000 MG tablet Take 1,000 mg by mouth daily.   Yes Historical Provider, MD  aspirin 81 MG tablet Take 81 mg by mouth daily.   Yes Historical Provider, MD  fexofenadine (ALLEGRA) 180 MG tablet Take 180 mg by mouth daily.   Yes Historical Provider, MD  ibuprofen (ADVIL,MOTRIN) 200 MG tablet Take 400 mg by mouth every 8 (eight) hours as needed for pain.   Yes Historical Provider, MD  Multiple Vitamins-Minerals (STRESS TAB NF PO) Take 1 tablet by mouth daily.   Yes Historical Provider, MD  Olmesartan-Amlodipine-HCTZ (TRIBENZOR) 20-5-12.5 MG TABS Take 1 tablet by mouth daily.   Yes Historical Provider, MD  oxyCODONE (ROXICODONE) 5 MG/5ML solution Take 5-10 mLs (5-10 mg total) by mouth every 4 (four) hours as needed. Patient taking differently: Take 5-10 mg by mouth every 4 (four) hours as needed for severe pain.  09/06/14  Yes Excell Seltzer, MD  rosuvastatin (CRESTOR) 10 MG tablet Take 10 mg by mouth daily.   Yes Historical Provider, MD  vitamin B-12 (CYANOCOBALAMIN) 1000 MCG tablet Take 1,000 mcg by mouth daily.   Yes Historical Provider, MD     Vital Signs: BP 179/86 mmHg  Pulse 96  Temp(Src) 98.4 F (36.9 C) (Oral)  Resp 18  Ht 5\' 5"  (1.651 m)  Wt 131 lb (59.422 kg)  BMI 21.80 kg/m2  SpO2 97%  Physical Exam awake/alert; abd soft, tender  lower pelvic region  Imaging: Ct Abdomen Pelvis W Contrast  02/04/2016  ADDENDUM REPORT: 02/04/2016 21:19 ADDENDUM: Critical Value/emergent results were called by telephone at the time of interpretation on 02/04/2016 at 9:13 pm to Dr. Hilbert Bible , who verbally acknowledged these results. Electronically Signed   By: Nelson Chimes M.D.   On: 02/04/2016 21:19  02/04/2016  CLINICAL DATA:  Nausea and vomiting beginning today. Diarrhea and loose stool. History of paraesophageal hernia. Pain most severe in the left lower quadrant. EXAM: CT ABDOMEN AND PELVIS WITH CONTRAST TECHNIQUE: Multidetector CT imaging of the abdomen and pelvis was performed using the standard protocol following bolus administration of intravenous contrast. CONTRAST:  48mL ISOVUE-300 IOPAMIDOL (ISOVUE-300) INJECTION 61% COMPARISON:  Radiography same day. CT 09/01/2014 abdominal MRI 05/28/2005 FINDINGS: There is a large acute retroperitoneal hemorrhage on the left apparently arising from a heterogeneous 3.5 x 4.5 cm mass of the left upper pole ventral kidney with prominent fat components. Most likely diagnosis is angiomyolipoma, as I believe there was a lesion present in this area in 2006, making renal cell carcinoma quite unlikely. Most of the hemorrhage is retroperitoneal, but there is probably a small amount of intraperitoneal hemorrhage surrounding the spleen and a moderate amount in the pelvic cul de sac. Lung bases are clear. There is aortic atherosclerosis and coronary artery calcification. The liver is normal. No calcified gallstones. No intrinsic splenic lesion. No intrinsic pancreatic lesion. The right kidney shows atrophy and arterial calcifications. In  addition to the findings above, the left kidney contains a 1 cm cyst in the lower pole. Abdominal aortic atherosclerosis is present without aneurysm. The IVC is normal. There has been previous hysterectomy. No pelvic mass. The bladder is unremarkable. Small inguinal hernia on the left. Small  hiatal hernia. No primary bowel pathology is identified. There is advanced degenerative disease of the lumbar spine including chronic anterolisthesis at L5-S1 and S1-S2. 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 This is estimated at about 3.5 x 4.5 cm in size. Retroperitoneal hemorrhage does show intraperitoneal extension in the left upper quadrant around the spleen and in the pelvic cul de sac. Where are in the process of emergency call report but have not successfully located the covering physician at this time. Aortic atherosclerosis.  Coronary artery calcifications. Electronically Signed: By: Nelson Chimes M.D. On: 02/04/2016 21:12   Dg Abd Acute W/chest  02/04/2016  CLINICAL DATA:  Sudden onset of mid abdominal pain and back pain starting this morning, vomiting, diarrhea EXAM: DG ABDOMEN ACUTE W/ 1V CHEST COMPARISON:  09/03/2014 FINDINGS: Cardiomediastinal silhouette is unremarkable. No acute infiltrate or pleural effusion. No pulmonary edema. There is normal small bowel gas pattern. Moderate stool noted in right colon. Degenerative changes are noted lower lumbar spine. No evidence of free abdominal air. IMPRESSION: No acute disease. Moderate stool noted in right colon. No free abdominal air. Normal small bowel gas pattern. Electronically Signed   By: Lahoma Crocker M.D.   On: 02/04/2016 15:18    Labs:  CBC:  Recent Labs  02/04/16 2158 02/05/16 0312 02/05/16 0858 02/05/16 1721 02/06/16 0101 02/06/16 1145  WBC 14.2* 11.3* 11.7*  --  9.6  --   HGB 9.0* 8.4* 9.0* 7.8* 8.9* 10.1*  HCT 26.8* 24.5* 27.0* 23.1* 26.0* 29.5*  PLT 302 261 306  --  250  --     COAGS:  Recent Labs  02/05/16 0858  INR 1.17  APTT 25    BMP:  Recent Labs  02/04/16 1255 02/05/16 0312  NA 139 139  K 3.2* 3.5  CL 107 112*  CO2 20* 21*  GLUCOSE 174* 102*  BUN 22* 21*  CALCIUM 8.8* 8.1*  CREATININE 1.16* 0.89  GFRNONAA 44* >60    GFRAA 51* >60    LIVER FUNCTION TESTS:  Recent Labs  02/04/16 1255 02/05/16 0312  BILITOT 0.8 0.6  AST 26 18  ALT 17 14  ALKPHOS 60 47  PROT 6.4* 5.5*  ALBUMIN 3.9 3.3*    Assessment and Plan: 81 yo female with recent extraperitoneal hemorrhage, related to what is most likely a left renal AML; AF; VSS; WBC nl; hgb 10.1(8.9), creat nl; cont conservative measures for now as long as hemodynamically stable; pt aware of possible need for renal/AML embolization should status worsen.   Electronically Signed: D. Rowe Robert 02/06/2016, 1:41 PM   I spent a total of 15 minutes at the the patient's bedside AND on the patient's hospital floor or unit, greater than 50% of which was counseling/coordinating care for extraperitoneal hemorrhage most likely from left renal AML

## 2016-02-07 DIAGNOSIS — R1032 Left lower quadrant pain: Secondary | ICD-10-CM

## 2016-02-07 DIAGNOSIS — D3002 Benign neoplasm of left kidney: Secondary | ICD-10-CM

## 2016-02-07 DIAGNOSIS — D179 Benign lipomatous neoplasm, unspecified: Secondary | ICD-10-CM

## 2016-02-07 LAB — CBC
HCT: 29.7 % — ABNORMAL LOW (ref 36.0–46.0)
Hemoglobin: 10.4 g/dL — ABNORMAL LOW (ref 12.0–15.0)
MCH: 29.3 pg (ref 26.0–34.0)
MCHC: 35 g/dL (ref 30.0–36.0)
MCV: 83.7 fL (ref 78.0–100.0)
PLATELETS: 295 10*3/uL (ref 150–400)
RBC: 3.55 MIL/uL — AB (ref 3.87–5.11)
RDW: 15.2 % (ref 11.5–15.5)
WBC: 11.4 10*3/uL — AB (ref 4.0–10.5)

## 2016-02-07 LAB — GLUCOSE, CAPILLARY
GLUCOSE-CAPILLARY: 97 mg/dL (ref 65–99)
Glucose-Capillary: 99 mg/dL (ref 65–99)
Glucose-Capillary: 121 mg/dL — ABNORMAL HIGH (ref 65–99)
Glucose-Capillary: 140 mg/dL — ABNORMAL HIGH (ref 65–99)

## 2016-02-07 LAB — HEMOGLOBIN AND HEMATOCRIT, BLOOD
HCT: 30.5 % — ABNORMAL LOW (ref 36.0–46.0)
HEMOGLOBIN: 10.5 g/dL — AB (ref 12.0–15.0)

## 2016-02-07 MED ORDER — DIPHENHYDRAMINE HCL 25 MG PO CAPS: 25.0000 mg | ORAL_CAPSULE | ORAL | Status: DC | PRN

## 2016-02-07 MED ORDER — PREMIER PROTEIN SHAKE
11.0000 [oz_av] | Freq: Two times a day (BID) | ORAL | Status: DC
  Administered 2016-02-07 – 2016-02-08 (×2): 11 [oz_av] via ORAL
  Filled 2016-02-07: qty 325.31

## 2016-02-07 MED ORDER — FEXOFENADINE HCL 180 MG PO TABS
180.0000 mg | ORAL_TABLET | Freq: Every day | ORAL | Status: DC
  Administered 2016-02-07 – 2016-02-08 (×2): 180 mg via ORAL
  Filled 2016-02-07 (×2): qty 1

## 2016-02-07 MED ORDER — MENTHOL 3 MG MT LOZG
1.0000 | LOZENGE | OROMUCOSAL | Status: DC | PRN
  Administered 2016-02-07: 3 mg via ORAL
  Filled 2016-02-07: qty 9

## 2016-02-07 MED ORDER — DIPHENHYDRAMINE HCL 50 MG PO CAPS
50.0000 mg | ORAL_CAPSULE | Freq: Once | ORAL | Status: AC
  Administered 2016-02-08: 50 mg via ORAL
  Filled 2016-02-07: qty 1

## 2016-02-07 NOTE — Progress Notes (Signed)
PROGRESS NOTE    Anna Hoffman  [id number]  DOB: 03/17/1935  DOA: 02/04/2016 PCP: Maximino Greenland, MD Outpatient Specialists:   Hospital course: Anna Hoffman is a 81 y.o. female presenting through the ED at [facility] for abdominal pain, N/V/D on Monday, June 3rd. 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  Pt followed by urology and I.R.   Assessment & Plan:   1. Left AML with extraperitoneal hemorrhage - Pt Hg remained stable overnight.  Pt received 1 Unit PRBC 7/4.  Appreciate urology recommendations.  Appreciate IR consult.  Hemodynamics have been stable.  Following Hg.   2. Type 2 Diabetes Mellitus - blood sugars have been stable. Following.  Continue current management.  3. Anemia - secondary to acute blood loss - Pt is s/p 1 unit PRBC. Following.  4. Hypokalemia - repleted.  5. Nausea and vomiting - Pt has a lot of sinus drainage, unable to take home allegra and does not want to take claritin.      DVT prophylaxis: SCDs Code Status: Full Family Communication: Pt updated at bedside Disposition Plan: Plan home soon, later today or tomorr  Consultants:  Urology  I.R.    Subjective: Pt having some postnasal drainage and allergy symptoms causing her to vomit when she eats.     Objective: Filed Vitals:   02/06/16 0549 02/06/16 1442 02/06/16 2234 02/07/16 0631  BP: 179/86 151/83 166/86 163/87  Pulse: 96 97 94 96  Temp: 98.4 F (36.9 C) 97.7 F (36.5 C) 99.3 F (37.4 C) 98.5 F (36.9 C)  TempSrc: Oral Oral Oral Oral  Resp: 18 18 19 20   Height:      Weight:    127 lb (57.607 kg)  SpO2: 97% 96% 94% 95%    Intake/Output Summary (Last 24 hours) at 02/07/16 0926 Last data filed at 02/07/16 0403  Gross per 24 hour  Intake    360 ml  Output    250 ml  Net    110 ml   Filed Weights   02/05/16 0548 02/06/16 0500 02/07/16 0631  Weight: 131 lb (59.421 kg) 131 lb (59.422 kg) 127  lb (57.607 kg)    Exam:  General exam: awake, alert, no distress, cooperative.  Respiratory system: Clear. No increased work of breathing. Cardiovascular system: S1 & S2 heard, RRR. No JVD, murmurs, gallops, clicks or pedal edema. Gastrointestinal system: Abdomen is nondistended, soft and mildly tender left flank, LLQ, no guarding. Normal bowel sounds heard. Central nervous system: Alert and oriented. No focal neurological deficits. Extremities: no cyanosis or clubbing.   Data Reviewed: Basic Metabolic Panel:  Recent Labs Lab 02/04/16 1255 02/05/16 0312  NA 139 139  K 3.2* 3.5  CL 107 112*  CO2 20* 21*  GLUCOSE 174* 102*  BUN 22* 21*  CREATININE 1.16* 0.89  CALCIUM 8.8* 8.1*   Liver Function Tests:  Recent Labs Lab 02/04/16 1255 02/05/16 0312  AST 26 18  ALT 17 14  ALKPHOS 60 47  BILITOT 0.8 0.6  PROT 6.4* 5.5*  ALBUMIN 3.9 3.3*    Recent Labs Lab 02/04/16 1255  LIPASE 16   No results for input(s): AMMONIA in the last 168 hours. CBC:  Recent Labs Lab 02/04/16 2158 02/05/16 0312 02/05/16 0858 02/05/16 1721 02/06/16 0101 02/06/16 1145 02/06/16 1957 02/07/16 0447  WBC 14.2* 11.3* 11.7*  --  9.6  --   --  11.4*  NEUTROABS  --   --  8.4*  --  6.2  --   --   --   HGB 9.0* 8.4* 9.0* 7.8* 8.9* 10.1* 11.1* 10.4*  HCT 26.8* 24.5* 27.0* 23.1* 26.0* 29.5* 32.1* 29.7*  MCV 83.5 85.4 86.3  --  85.0  --   --  83.7  PLT 302 261 306  --  250  --   --  295   Cardiac Enzymes: No results for input(s): CKTOTAL, CKMB, CKMBINDEX, TROPONINI in the last 168 hours. BNP (last 3 results) No results for input(s): PROBNP in the last 8760 hours. CBG:  Recent Labs Lab 02/06/16 0749 02/06/16 1208 02/06/16 1656 02/06/16 2237 02/07/16 0736  GLUCAP 97 82 95 125* 99    Recent Results (from the past 240 hour(s))  C difficile quick scan w PCR reflex     Status: None   Collection Time: 02/04/16  2:27 PM  Result Value Ref Range Status   C Diff antigen NEGATIVE NEGATIVE  Final   C Diff toxin NEGATIVE NEGATIVE Final   C Diff interpretation Negative for toxigenic C. difficile  Final     Studies: No results found.   Scheduled Meds: . sodium chloride   Intravenous Once  . diatrizoate meglumine-sodium  30 mL Oral Once  . irbesartan  150 mg Oral Daily   And  . hydrochlorothiazide  12.5 mg Oral Daily  . insulin aspart  0-5 Units Subcutaneous QHS  . insulin aspart  0-9 Units Subcutaneous TID WC  . pantoprazole  40 mg Oral Daily  . protein supplement shake  11 oz Oral BID BM  . rosuvastatin  10 mg Oral Daily  . vitamin B-12  1,000 mcg Oral Daily   Continuous Infusions:   Active Problems:   Vomiting   Renal mass, left   Gastroenteritis   Diarrhea   Hypokalemia   Protein-calorie malnutrition, severe   Abdominal pain  Time spent:   Irwin Brakeman, MD, FAAFP [facility] Pager [phone]  If 7PM-7AM, please contact night-coverage [email or web address] Password [email or web address] 02/07/2016, 9:26 AM    LOS: 3 days

## 2016-02-08 ENCOUNTER — Other Ambulatory Visit: Payer: Self-pay | Admitting: Radiology

## 2016-02-08 DIAGNOSIS — I10 Essential (primary) hypertension: Secondary | ICD-10-CM

## 2016-02-08 DIAGNOSIS — K683 Retroperitoneal hematoma: Secondary | ICD-10-CM | POA: Diagnosis present

## 2016-02-08 DIAGNOSIS — D3002 Benign neoplasm of left kidney: Secondary | ICD-10-CM

## 2016-02-08 DIAGNOSIS — D1771 Benign lipomatous neoplasm of kidney: Secondary | ICD-10-CM | POA: Diagnosis present

## 2016-02-08 DIAGNOSIS — R58 Hemorrhage, not elsewhere classified: Secondary | ICD-10-CM | POA: Diagnosis present

## 2016-02-08 LAB — CBC
HEMATOCRIT: 31.2 % — AB (ref 36.0–46.0)
HEMOGLOBIN: 10.9 g/dL — AB (ref 12.0–15.0)
MCH: 29.1 pg (ref 26.0–34.0)
MCHC: 34.9 g/dL (ref 30.0–36.0)
MCV: 83.2 fL (ref 78.0–100.0)
Platelets: 339 10*3/uL (ref 150–400)
RBC: 3.75 MIL/uL — AB (ref 3.87–5.11)
RDW: 15 % (ref 11.5–15.5)
WBC: 9.7 10*3/uL (ref 4.0–10.5)

## 2016-02-08 LAB — GLUCOSE, CAPILLARY: GLUCOSE-CAPILLARY: 113 mg/dL — AB (ref 65–99)

## 2016-02-08 MED ORDER — ACETAMINOPHEN 325 MG PO TABS: 650.0000 mg | ORAL_TABLET | Freq: Four times a day (QID) | ORAL | Status: DC | PRN

## 2016-02-08 NOTE — Progress Notes (Signed)
[id number] Davis,BSN,RN3,CCM:Patient discharged home with no needs.

## 2016-02-08 NOTE — Discharge Summary (Signed)
Physician Discharge Summary  Anna Hoffman DOB: Aug 27, 1934 DOA: 02/04/2016  PCP: Maximino Greenland, MD Urologist: Risa Grill Radiologist: Rolla Plate  Admit date: 02/04/2016 Discharge date: 02/08/2016  Admitted From: Home Disposition:  Home  Recommendations for Outpatient Follow-up:  1. Follow up with PCP in 1 weeks.  Please discuss medication adjustments due to cost.  Pt can't afford the high cost name brand medications.  2. Please obtain CBC in one week 3. Please follow up with urologist next week to further discuss angiomyolipoma treatment and management options.   4. Pt advised to hold the aspirin until she can see PCP and discuss restarting.    Discharge Condition: Stable CODE STATUS: FULL Diet recommendation: Heart Healthy / Carb Modified  Brief/Interim Summary: Hospital course: Anna Hoffman is a 81 y.o. female presenting through the ED at [facility] for abdominal pain, N/V/D on Monday, June 3rd. 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 Pt followed by urology and I.R. Pt's hemoglobin was followed and remained stable.  Pt to be discharged with close outpatient followup.    Assessment & Plan:  1. Left angiomyolipoma (AML) with extraperitoneal hemorrhage - Pt Hg remained stable since pt received 1 Unit PRBC on 7/4. Appreciate urology consult,  Pt to follow up closely with them to discuss further management options regarding the angiomyolipoma. Appreciate IR consult. Hemodynamics have been stable. No intervention was required.  Follow up with IR outpatient.  I explained to the patient that these tumors tend to bleed spontaneously and that there is a possibility that she could have another bleeding event.  Pt verbalized understanding.     2. Type 2 Diabetes Mellitus - blood sugars have been stable. Follow up with PCP.   3. Anemia - secondary to acute blood loss - Pt is s/p 1 unit PRBC.  Hemoglobin 10.9 at discharge.  Encouraged iron rich food diet.    4. Hypokalemia - repleted.  5. Nausea and vomiting - Resolved now, likely initially because of the bleed, but Pt has a lot of sinus drainage, resume home allegra.   DVT prophylaxis: SCDs Code Status: Full Family Communication: Pt and son updated at bedside Disposition Plan: Plan home today  Consultants:  Urology  I.R.   Discharge Diagnoses:  Active Problems:   Angiomyolipoma of left kidney   Retroperitoneal bleed   Vomiting   Hypertension   Renal mass, left   Gastroenteritis   Diarrhea   Hypokalemia   Protein-calorie malnutrition, severe   Abdominal pain    Discharge Instructions  Discharge Instructions    Diet - low sodium heart healthy    Complete by:  As directed      Increase activity slowly    Complete by:  As directed             Medication List    STOP taking these medications        aspirin 81 MG tablet     ibuprofen 200 MG tablet  Commonly known as:  ADVIL,MOTRIN     oxyCODONE 5 MG/5ML solution  Commonly known as:  ROXICODONE      TAKE these medications        acetaminophen 325 MG tablet  Commonly known as:  TYLENOL  Take 2 tablets (650 mg total) by mouth every 6 (six) hours as needed for mild pain (or Fever >/= 101).     fexofenadine 180 MG tablet  Commonly known as:  ALLEGRA  Take 180 mg by mouth daily.     rosuvastatin 10 MG tablet  Commonly known as:  CRESTOR  Take 10 mg by mouth daily.     STRESS TAB NF PO  Take 1 tablet by mouth daily.     TRIBENZOR 20-5-12.5 MG Tabs  Generic drug:  Olmesartan-Amlodipine-HCTZ  Take 1 tablet by mouth daily.     vitamin B-12 1000 MCG tablet  Commonly known as:  CYANOCOBALAMIN  Take 1,000 mcg by mouth daily.     vitamin C 1000 MG tablet  Take 1,000 mg by mouth daily.           Follow-up Information    Follow up with Maximino Greenland, MD. Schedule an appointment as soon as possible for a visit in 1 week.   Specialty:   Internal Medicine   Why:  Hospital Follow Up   Contact information:   [address] [place] [address] [phone]       Follow up with [facility], MD. Schedule an appointment as soon as possible for a visit in 1 week.   Specialty:  Urology   Why:  Hospital Follow Up   Contact information:   [place] [address] [phone]       Follow up with WAGNER, JAIME, DO. Schedule an appointment as soon as possible for a visit in 1 week.   Specialty:  Interventional Radiology   Why:  Hospital Follow Up   Contact information:   [address] [place] [address] [phone]      Allergies  Allergen Reactions  . Amoxicillin Diarrhea    Has patient had a PCN reaction causing immediate rash, facial/tongue/throat swelling, SOB or lightheadedness with hypotension: no Has patient had a PCN reaction causing severe rash involving mucus membranes or skin necrosis: no Has patient had a PCN reaction that required hospitalization: no pharmacist consult Has patient had a PCN reaction occurring within the last 10 years: yes If all of the above answers are "NO", then may proceed with Cephalosporin use.   . Sulfa Antibiotics Rash    Procedures/Studies: Ct Abdomen Pelvis W Contrast  02/04/2016  ADDENDUM REPORT: 02/04/2016 21:19 ADDENDUM: Critical Value/emergent results were called by telephone at the time of interpretation on 02/04/2016 at 9:13 pm to Dr. Hilbert Bible , who verbally acknowledged these results. Electronically Signed   By: Nelson Chimes M.D.   On: 02/04/2016 21:19  02/04/2016  CLINICAL DATA:  Nausea and vomiting beginning today. Diarrhea and loose stool. History of paraesophageal hernia. Pain most severe in the left lower quadrant. EXAM: CT ABDOMEN AND PELVIS WITH CONTRAST TECHNIQUE: Multidetector CT imaging of the abdomen and pelvis was performed using the standard protocol following bolus administration of intravenous contrast. CONTRAST:  9mL  ISOVUE-300 IOPAMIDOL (ISOVUE-300) INJECTION 61% COMPARISON:  Radiography same day. CT 09/01/2014 abdominal MRI 05/28/2005 FINDINGS: There is a large acute retroperitoneal hemorrhage on the left apparently arising from a heterogeneous 3.5 x 4.5 cm mass of the left upper pole ventral kidney with prominent fat components. Most likely diagnosis is angiomyolipoma, as I believe there was a lesion present in this area in 2006, making renal cell carcinoma quite unlikely. Most of the hemorrhage is retroperitoneal, but there is probably a small amount of intraperitoneal hemorrhage surrounding the spleen and a moderate amount in the pelvic cul de sac. Lung bases are clear. There is aortic atherosclerosis and coronary artery calcification. The liver is normal.  No calcified gallstones. No intrinsic splenic lesion. No intrinsic pancreatic lesion. The right kidney shows atrophy and arterial calcifications. In addition to the findings above, the left kidney contains a 1 cm cyst in the lower pole. Abdominal aortic atherosclerosis is present without aneurysm. The IVC is normal. There has been previous hysterectomy. No pelvic mass. The bladder is unremarkable. Small inguinal hernia on the left. Small hiatal hernia. No primary bowel pathology is identified. There is advanced degenerative disease of the lumbar spine including chronic anterolisthesis at L5-S1 and S1-S2. 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 This is estimated at about 3.5 x 4.5 cm in size. Retroperitoneal hemorrhage does show intraperitoneal extension in the left upper quadrant around the spleen and in the pelvic cul de sac. Where are in the process of emergency call report but have not successfully located the covering physician at this time. Aortic atherosclerosis.  Coronary artery calcifications. Electronically Signed: By: Nelson Chimes M.D. On: 02/04/2016 21:12   Dg Abd Acute  W/chest  02/04/2016  CLINICAL DATA:  Sudden onset of mid abdominal pain and back pain starting this morning, vomiting, diarrhea EXAM: DG ABDOMEN ACUTE W/ 1V CHEST COMPARISON:  09/03/2014 FINDINGS: Cardiomediastinal silhouette is unremarkable. No acute infiltrate or pleural effusion. No pulmonary edema. There is normal small bowel gas pattern. Moderate stool noted in right colon. Degenerative changes are noted lower lumbar spine. No evidence of free abdominal air. IMPRESSION: No acute disease. Moderate stool noted in right colon. No free abdominal air. Normal small bowel gas pattern. Electronically Signed   By: Lahoma Crocker M.D.   On: 02/04/2016 15:18     Subjective: Pt without complaints.  Pain resolved.  Eating and drinking well.  No vomiting or nausea.  Hemoglobin has been stable.    Discharge Exam: Filed Vitals:   02/07/16 2136 02/08/16 0521  BP: 137/83 127/70  Pulse: 100 98  Temp: 98.3 F (36.8 C) 99.2 F (37.3 C)  Resp: 20 18   Filed Vitals:   02/07/16 1355 02/07/16 2136 02/08/16 0500 02/08/16 0521  BP: 123/65 137/83  127/70  Pulse: 100 100  98  Temp: 99 F (37.2 C) 98.3 F (36.8 C)  99.2 F (37.3 C)  TempSrc: Oral Oral  Oral  Resp: 20 20  18   Height:      Weight:   126 lb 8.7 oz (57.4 kg)   SpO2: 98% 93%  94%    General exam: awake, alert, no distress, cooperative.  Respiratory system: Clear. No increased work of breathing. Cardiovascular system: S1 & S2 heard, RRR. No JVD, murmurs, gallops, clicks or pedal edema. Gastrointestinal system: Abdomen is nondistended, soft and mildly tender left flank, LLQ, no guarding. Normal bowel sounds heard. Central nervous system: Alert and oriented. No focal neurological deficits. Extremities: no cyanosis or clubbing.   The results of significant diagnostics from this hospitalization (including imaging, microbiology, ancillary and laboratory) are listed below for reference.    Microbiology: Recent Results (from the past 240 hour(s))   C difficile quick scan w PCR reflex     Status: None   Collection Time: 02/04/16  2:27 PM  Result Value Ref Range Status   C Diff antigen NEGATIVE NEGATIVE Final   C Diff toxin NEGATIVE NEGATIVE Final   C Diff interpretation Negative for toxigenic C. difficile  Final     Labs: BNP (last 3 results) No results for input(s): BNP in the last 8760 hours. Basic Metabolic Panel:  Recent Labs Lab 02/04/16 1255 02/05/16 0312  NA 139 139  K 3.2* 3.5  CL 107 112*  CO2 20* 21*  GLUCOSE 174* 102*  BUN 22* 21*  CREATININE 1.16* 0.89  CALCIUM 8.8* 8.1*   Liver Function Tests:  Recent Labs Lab 02/04/16 1255 02/05/16 0312  AST 26 18  ALT 17 14  ALKPHOS 60 47  BILITOT 0.8 0.6  PROT 6.4* 5.5*  ALBUMIN 3.9 3.3*    Recent Labs Lab 02/04/16 1255  LIPASE 16   No results for input(s): AMMONIA in the last 168 hours. CBC:  Recent Labs Lab 02/05/16 0312 02/05/16 0858  02/06/16 0101 02/06/16 1145 02/06/16 1957 02/07/16 0447 02/07/16 1200 02/08/16 0433  WBC 11.3* 11.7*  --  9.6  --   --  11.4*  --  9.7  NEUTROABS  --  8.4*  --  6.2  --   --   --   --   --   HGB 8.4* 9.0*  < > 8.9* 10.1* 11.1* 10.4* 10.5* 10.9*  HCT 24.5* 27.0*  < > 26.0* 29.5* 32.1* 29.7* 30.5* 31.2*  MCV 85.4 86.3  --  85.0  --   --  83.7  --  83.2  PLT 261 306  --  250  --   --  295  --  339  < > = values in this interval not displayed. Cardiac Enzymes: No results for input(s): CKTOTAL, CKMB, CKMBINDEX, TROPONINI in the last 168 hours. BNP: Invalid input(s): POCBNP CBG:  Recent Labs Lab 02/07/16 0736 02/07/16 1209 02/07/16 1638 02/07/16 2122 02/08/16 0736  GLUCAP 99 97 121* 140* 113*   D-Dimer No results for input(s): DDIMER in the last 72 hours. Hgb A1c No results for input(s): HGBA1C in the last 72 hours. Lipid Profile No results for input(s): CHOL, HDL, LDLCALC, TRIG, CHOLHDL, LDLDIRECT in the last 72 hours. Thyroid function studies No results for input(s): TSH, T4TOTAL, T3FREE,  THYROIDAB in the last 72 hours.  Invalid input(s): FREET3 Anemia work up No results for input(s): VITAMINB12, FOLATE, FERRITIN, TIBC, IRON, RETICCTPCT in the last 72 hours. Urinalysis    Component Value Date/Time   COLORURINE YELLOW 02/04/2016 1459   APPEARANCEUR CLEAR 02/04/2016 1459   LABSPEC 1.019 02/04/2016 1459   PHURINE 5.5 02/04/2016 1459   GLUCOSEU NEGATIVE 02/04/2016 1459   HGBUR NEGATIVE 02/04/2016 1459   BILIRUBINUR NEGATIVE 02/04/2016 1459   KETONESUR NEGATIVE 02/04/2016 1459   PROTEINUR 30* 02/04/2016 1459   UROBILINOGEN 0.2 09/01/2014 0300   NITRITE NEGATIVE 02/04/2016 1459   LEUKOCYTESUR TRACE* 02/04/2016 1459   Sepsis Labs Invalid input(s): PROCALCITONIN,  WBC,  LACTICIDVEN Microbiology Recent Results (from the past 240 hour(s))  C difficile quick scan w PCR reflex     Status: None   Collection Time: 02/04/16  2:27 PM  Result Value Ref Range Status   C Diff antigen NEGATIVE NEGATIVE Final   C Diff toxin NEGATIVE NEGATIVE Final   C Diff interpretation Negative for toxigenic C. difficile  Final   Time coordinating discharge: 32 minutes  SIGNED:   Irwin Brakeman, MD  [facility] 02/08/2016, 10:49 AM Pager   If 7PM-7AM, please contact night-coverage [email or web address] Password [email or web address]

## 2016-02-08 NOTE — Discharge Instructions (Signed)
Return if symptoms come back or new problems develop.       Angiomyolipomas  are the most common benign tumour of the kidney. Although regarded as benign, angiomyolipomas may grow such that kidney function is impaired or the blood vessels may dilate and burst leading to bleeding. Angiomyolipomas are strongly associated with the genetic disease tuberous sclerosis, in which most individuals will have several angiomyolipomas affecting both kidneys. They are also commonly found in women with the rare lung disease lymphangioleiomyomatosis. Angiomyolipomas are less commonly found in the liver and rarely in other organs. Whether associated with these diseases or sporadic, angiomyolipomas are caused by mutations in either the TSC1 or TSC2 genes, which govern cell growth and proliferation. They are composed of blood vessels, smooth muscle cells and fat cells. Large angiomyolipoma can be treated with embolisation. Drug therapy for angiomyolipoma is at the research stage. The Tuberous Sclerosis Alliance has published guidelines on diagnosis, surveillance and management.[1]  Contents  Signs and symptoms[edit] If the dilated blood vessels in an angiomyolipoma rupture, the resulting retroperitoneal haemorrhage causes sudden pain, accompanied with nausea and vomiting. When the patient presents in the emergency department, up to 20% will be in shock.[2] Pathophysiology[edit]  Myoid cells with clear cytoplasm spinning off of large vessels in a background of mature fat: the classic microscopic features of angiomyolipoma.  Angiomyolipomas are tumours consisting of perivascular epithelioid cells (cells which are found surrounding blood vessels and which resemble epithelial cells). A tumour of this kind is known as a PEComa, from the initials of perivascular epithelioid cell. Older literature may classify them as hamartomas (benign tumours consisting of cells in their correct location but forming a disorganised mass) or  choristoma (benign tumours consisting of normal cells in the wrong location). PEComas are themselves a kind of mesenchymal tumour which involves cells that form the connective tissue, cardiovascular and lymphatic systems.[2] An angiomyolipoma is composed of varying proportions of vascular cells, immature smooth muscle cells and fat cells.[2] These three components respectively give rise to the components of the name: angio-, myo- and lip-. The -oma suffix indicates a tumour. Angiomyolipomas are typically found in the kidney but have also been commonly found in the liver and less commonly the ovary, fallopian tube, spermatic cord, palate and colon. The [employer] imaging classification system for renal angiomyolipomas is based on the location of the angiomyolipoma within the kidney.[3] Since all three components of an angiomyolipoma (vascular cells, immature smooth muscle cells and fat cells) contain a "second hit" mutation, they are believed to have derived from a common progenitor cell that suffered the common second hit mutation.[2]  Diagnosis[edit] There are three methods of scanning that detect angiomyolipoma: ultrasound, CT and MRI. Ultrasound is standard and is particularly sensitive to the fat in angiomyolipoma but less so to the solid components. However it is hard to make accurate measurements with ultrasound, particularly if the angiomyolipoma is near the surface of the kidney ([place] Grade III).[3] Computed tomography (CT) is very detailed and fast and allows accurate measurement. However, it exposes the patient to radiation and the dangers that a contrast dye used to aid the scanning may itself harm the kidneys. Magnetic resonance imaging (MRI) is safer than CT but many patients (particularly those with the learning difficulties or behavioural problems found in tuberous sclerosis) require sedation or general anaesthesia and the scan cannot be performed quickly.[2] Some other kidney tumours contain  fat, so the presence of fat isn't diagnostic. It can be difficult to distinguish a fat-poor angiomyolipoma from a  renal cell carcinoma[4] and a lesion growing at greater than 5 mm per year may warrant a biopsy in order to distinguish it from this form of cancer.[2] Incidental discovery of angiomyolipomas should trigger consideration of tuberous sclerosis complex (TSC) and lymphangioleiomyomatosis, especially if they are large, bilateral and/or multiple. Screening for TSC includes a detailed physical exam, including dermatologic and ophthalmologic evaluations, by [facility] expert clinicians and a CT or MRI of the brain. Screening for LAM includes a high resolution CT of the lung and pulmonary function testing.  Treatment[edit] Everolimus is FDA approved for the treatment of angiomyolipomas. Treatment should be considered for asymptomatic, growing AML measuring larger than 3 cm in diameter.[1] Angiomyolipoma do not normally require surgery unless there is life-threatening bleeding.[4] Some centres may perform preventative selective embolisation of the angiomyolipoma if it is more than 4 cm in diameter, due to the risk of haemorrhage.[5] People with tuberous sclerosis are advised to have yearly renal scans, though it is possible that patients with very stable lesions could be monitored less frequently. The research in this area is lacking. Even if no angiomyolipoma is found, one can develop at any life stage. The angiomyolipoma can grow rapidly.[2] In tuberous sclerosis, typically many angiomyolipomas affecting each kidney. It is not uncommon for more than one intervention to be required during lifetime. Since kidney function may already be impaired (up to half the kidney may be lost before function loss is detectable), it is vital to preserve as much kidney as possible when removing any lesion. Large angiomyolipomas are treated by embolisation which reduces the risk of haemorrhage and can also shrink the lesion. A  side effect of this treatment is postembolisation syndrome: severe pain and fever however this is easily managed and lasts only a few days.[2] A ruptured aneurysm in an angiomyolipoma leads to blood loss that must be stopped (though embolisation) and compensated for (through intravenous fluid replacement). Therefore, removal of the affected kidney (nephrectomy) is strongly discouraged though may occur if the emergency department is not knowledgeable about tuberous sclerosis.[2] Embolisation involves inserting a catheter along the blood vessels to the tumour. The blood vessels are then blocked, typically by injecting ethanol or inert particles. The procedure can be very painful, so analgesics are used. The destroyed kidney tissue often causes post-embolisation syndrome, which manifests as nausea, vomiting, fever and abdominal pain, and lasts a few days. Embolisation (in general) has an 8% rate of morbidity and a 2.5% rate of mortality, so is not considered lightly.[5] Patients with kidney loss should be monitored for hypertension (and treated for it if discovered) and avoid nephrotoxic drugs such as certain pain relievers and IV contrast agents. Such patients who are unable to communicate effectively (due to age or intellectual disability) are at risk of dehydration. Where multiple or large angiomyolipomas have caused chronic kidney disease, dialysis is required.[2] Robotic assisted partial nephrectomy has been proposed as a surgical treatment of a ruptured angiomyolipoma combining the advantages both of a kidney preservation procedure and the benefits of a minimal invasive procedure without compromising the safety of the patient.[6]   Retroperitoneal Bleeding Retroperitoneal bleeding happens when the blood vessels behind your abdomen bleed into the space between your abdomen and your back (retroperitoneal space). This space contains:  Your kidneys.  The glands that are on top of your kidneys (adrenal  glands).  The tubes that drain urine from your kidneys (ureters).  The large blood vessel that carries blood to your lower body (aorta).  Some parts of your digestive tract.  Bleeding may come from any organ in the retroperitoneal space. This is a rare but life-threatening condition. CAUSES  This condition may be caused by:  Surgery in the retroperitoneal space.  Injury (trauma) to your pelvic area, abdomen, or back. When retroperitoneal bleeding is not caused by trauma, it is called spontaneous retroperitoneal bleeding. Spontaneous retroperitoneal bleeding may be caused by:  Tumors of the kidneys or adrenal glands.  Use of medicines that thin your blood (anticoagulants).  Diseases that cause your body to be unable to form blood clots (clotting disorders).  Swelling in a blood vessel from a weakened artery wall (aneurysm) that can rupture and cause bleeding. Sometimes, the cause is not known (idiopathic). RISK FACTORS This condition is more likely to develop in people:  Who are on dialysis.  Who take anticoagulant medicine.  Who have high blood pressure.  Who have hardening of the arteries (atherosclerosis). SYMPTOMS Signs and symptoms may appear suddenly if the bleeding starts suddenly (acute), or they may appear gradually if the bleeding occurs slowly over time (chronic). Symptoms of this condition include:  Pain in the abdomen or side (flank).  Pain when pressing on the abdomen or flank.  Dizziness or light-headedness from low blood pressure.  Rapid heartbeat (tachycardia).  Blood in the urine.  Very low blood pressure, which can cause shock. Symptoms of shock include:  Fainting.  Trouble with breathing.  Cold, clammy skin. DIAGNOSIS This condition may be diagnosed based on your symptoms and your medical history. It is important to find the cause of the bleeding. Your health care provider will do a physical exam. You may also have tests,  including:  Abdominal X-rays to check for signs of bleeding or aneurysm.  Blood tests to check for low blood counts (anemia) or blood loss.  Imaging studies, such as:  CT scan. You may have dye injected into a blood vessel to help locate the source of the bleeding (CT angiography).  Ultrasound.  MRI. TREATMENT  The goal of treatment is to remove the blood and stop the bleeding. Treatment may include:  Giving you fluids through an IV tube to get your blood pressure back into a normal range.  Giving you blood from a donor (transfusion).  Placing a long tube (catheter) through your blood vessel to the site of bleeding and blocking the bleeding with a small plug (embolization).  Exploratory surgery to find the bleeding and stop it. This may be done using an operating scope (laparoscopy) or as an open surgery (laparotomy).   This information is not intended to replace advice given to you by your health care provider. Make sure you discuss any questions you have with your health care provider.   Document Released: 10/10/2004 Document Revised: 08/11/2014 Document Reviewed: 04/26/2014 Elsevier Interactive Patient Education [employer].

## 2016-02-08 NOTE — Plan of Care (Signed)
Problem: Food- and Nutrition-Related Knowledge Deficit (NB-1.1) Goal: Nutrition education Formal process to instruct or train a patient/client in a skill or to impart knowledge to help patients/clients voluntarily manage or modify food choices and eating behavior to maintain or improve health. Outcome: Completed/Met Date Met:  02/08/16 Nutrition Education Note  RD consulted for nutrition education regarding a low fiber diet for diverticulitis.  RD provided "Fiber Restricted Nutrition Therapy" handout from the Academy of [employer]etics. Reviewed patient's dietary recall. Provided examples of low and high fiber foods. Discouraged intake of high fiber foods, processed foods, caffeine and red meats. Encouraged use of a multi-vitamin while following a low fiber diet. Teach back method used.  Expect fair compliance.  Body mass index is 21.06 kg/(m^2). Pt meets criteria for normal based on current BMI.  Current diet order is low sodium, heart healthy, patient is consuming approximately 50-75% of meals at this time. Labs and medications reviewed. No further nutrition interventions warranted at this time. RD contact information provided. If additional nutrition issues arise, please re-consult RD.  Satira Anis. Raejean Swinford, MS, RD LDN Inpatient Clinical Dietitian Pager [phone]

## 2016-02-08 NOTE — Progress Notes (Signed)
Patient ID: Anna Hoffman, female   DOB: 1935-05-27, 81 y.o.   MRN: [id number]    Referring Physician(s): Arpin  Supervising Physician: Corrie Mckusick  Patient Status:  Inpatient  Chief Complaint:  Extraperitoneal hemorrhage  Subjective:  Pt without new changes; still having some nasal allergy symptoms; denies sig flank pain  Allergies: Amoxicillin and Sulfa antibiotics  Medications: Prior to Admission medications   Medication Sig Start Date End Date Taking? Authorizing Provider  Ascorbic Acid (VITAMIN C) 1000 MG tablet Take 1,000 mg by mouth daily.   Yes Historical Provider, MD  aspirin 81 MG tablet Take 81 mg by mouth daily.   Yes Historical Provider, MD  fexofenadine (ALLEGRA) 180 MG tablet Take 180 mg by mouth daily.   Yes Historical Provider, MD  ibuprofen (ADVIL,MOTRIN) 200 MG tablet Take 400 mg by mouth every 8 (eight) hours as needed for pain.   Yes Historical Provider, MD  Multiple Vitamins-Minerals (STRESS TAB NF PO) Take 1 tablet by mouth daily.   Yes Historical Provider, MD  Olmesartan-Amlodipine-HCTZ (TRIBENZOR) 20-5-12.5 MG TABS Take 1 tablet by mouth daily.   Yes Historical Provider, MD  oxyCODONE (ROXICODONE) 5 MG/5ML solution Take 5-10 mLs (5-10 mg total) by mouth every 4 (four) hours as needed. Patient taking differently: Take 5-10 mg by mouth every 4 (four) hours as needed for severe pain.  09/06/14  Yes Excell Seltzer, MD  rosuvastatin (CRESTOR) 10 MG tablet Take 10 mg by mouth daily.   Yes Historical Provider, MD  vitamin B-12 (CYANOCOBALAMIN) 1000 MCG tablet Take 1,000 mcg by mouth daily.   Yes Historical Provider, MD     Vital Signs: BP 127/70 mmHg  Pulse 98  Temp(Src) 99.2 F (37.3 C) (Oral)  Resp 18  Ht 5\' 5"  (1.651 m)  Wt 126 lb 8.7 oz (57.4 kg)  BMI 21.06 kg/m2  SpO2 94%  Physical Exam awake/alert; abd soft,minimal left lower abd soreness  Imaging: Ct Abdomen Pelvis W Contrast  02/04/2016  ADDENDUM REPORT: 02/04/2016 21:19 ADDENDUM: Critical  Value/emergent results were called by telephone at the time of interpretation on 02/04/2016 at 9:13 pm to Dr. Hilbert Bible , who verbally acknowledged these results. Electronically Signed   By: Nelson Chimes M.D.   On: 02/04/2016 21:19  02/04/2016  CLINICAL DATA:  Nausea and vomiting beginning today. Diarrhea and loose stool. History of paraesophageal hernia. Pain most severe in the left lower quadrant. EXAM: CT ABDOMEN AND PELVIS WITH CONTRAST TECHNIQUE: Multidetector CT imaging of the abdomen and pelvis was performed using the standard protocol following bolus administration of intravenous contrast. CONTRAST:  28mL ISOVUE-300 IOPAMIDOL (ISOVUE-300) INJECTION 61% COMPARISON:  Radiography same day. CT 09/01/2014 abdominal MRI 05/28/2005 FINDINGS: There is a large acute retroperitoneal hemorrhage on the left apparently arising from a heterogeneous 3.5 x 4.5 cm mass of the left upper pole ventral kidney with prominent fat components. Most likely diagnosis is angiomyolipoma, as I believe there was a lesion present in this area in 2006, making renal cell carcinoma quite unlikely. Most of the hemorrhage is retroperitoneal, but there is probably a small amount of intraperitoneal hemorrhage surrounding the spleen and a moderate amount in the pelvic cul de sac. Lung bases are clear. There is aortic atherosclerosis and coronary artery calcification. The liver is normal. No calcified gallstones. No intrinsic splenic lesion. No intrinsic pancreatic lesion. The right kidney shows atrophy and arterial calcifications. In addition to the findings above, the left kidney contains a 1 cm cyst in the lower pole. Abdominal aortic atherosclerosis is  present without aneurysm. The IVC is normal. There has been previous hysterectomy. No pelvic mass. The bladder is unremarkable. Small inguinal hernia on the left. Small hiatal hernia. No primary bowel pathology is identified. 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 This is estimated at about 3.5 x 4.5 cm in size. Retroperitoneal hemorrhage does show intraperitoneal extension in the left upper quadrant around the spleen and in the pelvic cul de sac. Where are in the process of emergency call report but have not successfully located the covering physician at this time. Aortic atherosclerosis.  Coronary artery calcifications. Electronically Signed: By: Nelson Chimes M.D. On: 02/04/2016 21:12   Dg Abd Acute W/chest  02/04/2016  CLINICAL DATA:  Sudden onset of mid abdominal pain and back pain starting this morning, vomiting, diarrhea EXAM: DG ABDOMEN ACUTE W/ 1V CHEST COMPARISON:  09/03/2014 FINDINGS: Cardiomediastinal silhouette is unremarkable. No acute infiltrate or pleural effusion. No pulmonary edema. There is normal small bowel gas pattern. Moderate stool noted in right colon. Degenerative changes are noted lower lumbar spine. No evidence of free abdominal air. IMPRESSION: No acute disease. Moderate stool noted in right colon. No free abdominal air. Normal small bowel gas pattern. Electronically Signed   By: Lahoma Crocker M.D.   On: 02/04/2016 15:18    Labs:  CBC:  Recent Labs  02/05/16 0858  02/06/16 0101  02/06/16 1957 02/07/16 0447 02/07/16 1200 02/08/16 0433  WBC 11.7*  --  9.6  --   --  11.4*  --  9.7  HGB 9.0*  < > 8.9*  < > 11.1* 10.4* 10.5* 10.9*  HCT 27.0*  < > 26.0*  < > 32.1* 29.7* 30.5* 31.2*  PLT 306  --  250  --   --  295  --  339  < > = values in this interval not displayed.  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 Rowe Robert 02/08/2016, 9:41 AM   I spent a total of 15 minutes at the the patient's bedside AND on the patient's hospital floor or unit, greater than 50% of which was counseling/coordinating care for extraperitoneal hemorrhage

## 2016-02-08 NOTE — Care Management Important Message (Signed)
Important Message  Patient Details  Name: Anna Hoffman MRN: [id number] Date of Birth: 03-18-1935   Medicare Important Message Given:  Yes    Camillo Flaming 02/08/2016, 10:37 AMImportant Message  Patient Details  Name: Anna Hoffman MRN: [id number] Date of Birth: February 08, 1935   Medicare Important Message Given:  Yes    Camillo Flaming 02/08/2016, 10:37 AM

## 2016-02-08 NOTE — Progress Notes (Signed)
Discharge instructions given to pt, verbalized understanding. Left the unit in stable condition. 

## 2016-02-12 DIAGNOSIS — J00 Acute nasopharyngitis [common cold]: Secondary | ICD-10-CM | POA: Diagnosis not present

## 2016-02-12 DIAGNOSIS — R58 Hemorrhage, not elsewhere classified: Secondary | ICD-10-CM | POA: Diagnosis not present

## 2016-02-12 DIAGNOSIS — I129 Hypertensive chronic kidney disease with stage 1 through stage 4 chronic kidney disease, or unspecified chronic kidney disease: Secondary | ICD-10-CM | POA: Diagnosis not present

## 2016-02-12 DIAGNOSIS — D649 Anemia, unspecified: Secondary | ICD-10-CM | POA: Diagnosis not present

## 2016-02-12 DIAGNOSIS — D3002 Benign neoplasm of left kidney: Secondary | ICD-10-CM | POA: Diagnosis not present

## 2016-02-12 DIAGNOSIS — R935 Abnormal findings on diagnostic imaging of other abdominal regions, including retroperitoneum: Secondary | ICD-10-CM | POA: Diagnosis not present

## 2016-02-25 DIAGNOSIS — D3000 Benign neoplasm of unspecified kidney: Secondary | ICD-10-CM | POA: Diagnosis not present

## 2016-03-06 ENCOUNTER — Ambulatory Visit
Admission: RE | Admit: 2016-03-06 | Discharge: 2016-03-06 | Disposition: A | Discharge status: Home / Self Care | Payer: Medicare Other | Source: Ambulatory Visit | Attending: Radiology | Admitting: Radiology

## 2016-03-06 DIAGNOSIS — D3002 Benign neoplasm of left kidney: Secondary | ICD-10-CM

## 2016-03-06 DIAGNOSIS — K661 Hemoperitoneum: Secondary | ICD-10-CM | POA: Diagnosis not present

## 2016-03-06 HISTORY — PX: IR GENERIC HISTORICAL: IMG1180011

## 2016-03-06 NOTE — Progress Notes (Signed)
Referring Physician(s): Dr. Gennaro Africa Dr. Louis Meckel  Supervising Physician: Corrie Mckusick  Patient Status:  Outpatient  Chief Complaint:  Extraperitoneal hemorrhage  HPI:  Anna Hoffman is a 81 y.o. female who presented to the ED at [facility] for abdominal pain, N/V/D on Monday, June 3rd.    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    At that time, her pain started on her left flank and then progressed to her lower abd.   She had associated diarrhea/nausea, and felt woozy with near-syncope.   She denied any hematuria, hematochezia/melena, SOB, CP, neurologic symptoms, weakness.    During her hospitalization she had no hypotension, and in fact, she had hypertension, with SBP 131-154.    Her Hgb on admission was 11.4, which is similar to 1 year prior at 11.6.    She had a subsequent drop in the hgb after admission to 8.4.    Dr. Earleen Newport consulted on her on February 05, 2016.  At the time of his evaluation, she had no ongoing signs of hemorrhage, as her Hgb had stabilized.  She had no hypotension, and her symptoms of abd pain and flank pain were improving.  He agreed that observation was reasonable.   She is here today to re-discuss her options for treatment of her tumor would include either nephron-sparing surgery by Urology, or embolization by Dr. Earleen Newport.  Since her hospitalization she had the flu which she has recovered from.   She also reports she has not been as active as she would like.   She states she is usually very active and would like to get back her her normal self.   Past Medical History:  Diagnosis Date  . Diabetes mellitus without complication ([place])   . Hypertension   . Hypokalemia     Past Surgical History:  Procedure Laterality Date  . ABDOMINAL HYSTERECTOMY    . ESOPHAGOGASTRODUODENOSCOPY N/A 09/01/2014   Procedure:  ESOPHAGOGASTRODUODENOSCOPY (EGD);  Surgeon: Lear Ng, MD;  Location: [facility];  Service: Endoscopy;  Laterality: N/A;  . HIATAL HERNIA REPAIR N/A 09/04/2014   Procedure: LAPAROSCOPIC REPAIR OF HIATAL HERNIA;  Surgeon: Excell Seltzer, MD;  Location: [facility];  Service: General;  Laterality: N/A;  With MESH  . KNEE SURGERY      Allergies: Amoxicillin and Sulfa antibiotics  Medications: Prior to Admission medications   Medication Sig Start Date End Date Taking? Authorizing Provider  acetaminophen (TYLENOL) 325 MG tablet Take 2 tablets (650 mg total) by mouth every 6 (six) hours as needed for mild pain (or Fever >/= 101). 02/08/16  Yes Clanford Marisa Hua, MD  Ascorbic Acid (VITAMIN C) 1000 MG tablet Take 1,000 mg by mouth daily.   Yes Historical Provider, MD  fexofenadine (ALLEGRA) 180 MG tablet Take 180 mg by mouth daily.   Yes Historical Provider, MD  Multiple Vitamins-Minerals (STRESS TAB NF PO) Take 1 tablet by mouth daily.   Yes Historical Provider, MD  Olmesartan-Amlodipine-HCTZ (TRIBENZOR) 20-5-12.5 MG TABS Take 1 tablet by mouth daily.   Yes Historical Provider, MD  rosuvastatin (CRESTOR) 10 MG tablet Take 10 mg by mouth daily.   Yes Historical Provider, MD  vitamin B-12 (CYANOCOBALAMIN) 1000 MCG tablet Take 1,000 mcg by mouth daily.   Yes Historical Provider, MD     Family History  Problem Relation Age of Onset  . Prostate cancer Father  Social History   Social History  . Marital status: Widowed    Spouse name: N/A  . Number of children: N/A  . Years of education: N/A   Social History Main Topics  . Smoking status: Never Smoker  . Smokeless tobacco: Not on file  . Alcohol use No  . Drug use: No  . Sexual activity: No   Other Topics Concern  . Not on file   Social History Narrative  . No narrative on file     Vital Signs: BP (!) 149/82 (BP Location: Right Arm, Patient Position: Sitting, Cuff Size: Normal)   Pulse 83   Temp 98 F (36.7 C) (Oral)    Resp 15   Ht 5' 5.5" (1.664 m)   Wt 127 lb (57.6 kg)   SpO2 98%   BMI 20.81 kg/m   Physical Exam Constitutional: She is oriented to person, place, and time. She appears well-developed and well-nourished. HENT: Head: Normocephalic and atraumatic. Neck: Normal range of motion. Musculoskeletal: Normal range of motion. Neurological: She is alert and oriented to person, place, and time. Psychiatric: She has a normal mood and affect. Her behavior is normal. Judgment and thought content normal.   Imaging: No results found.  Labs:  CBC:  Recent Labs  02/05/16 0858  02/06/16 0101  02/06/16 1957 02/07/16 0447 02/07/16 1200 02/08/16 0433  WBC 11.7*  --  9.6  --   --  11.4*  --  9.7  HGB 9.0*  < > 8.9*  < > 11.1* 10.4* 10.5* 10.9*  HCT 27.0*  < > 26.0*  < > 32.1* 29.7* 30.5* 31.2*  PLT 306  --  250  --   --  295  --  339  < > = values in this interval not displayed.  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 ? Dr. Earleen Newport discussed her options for treatment of her tumor would include either nephron-sparing surgery by Urology, or embolization. ? He discussed the fact that if she opts for embolization, the tumor will NOT be biopsied at that time. ? She has a CT scan pending on August 14th and she was instructed to keep that appointment. ? After review of that CT scan, Dr. Earleen Newport will contact Dr. Louis Meckel and they discuss best options for treatment  for Anna Hoffman. ? Either Dr. Louis Meckel or our office will give her a call and arrange the appropriate procedure. ? Anna Hoffman understands and is in agreement.   Signed: Murrell Redden PA-C 03/06/2016, 9:07 AM   Please refer to Dr. Pasty Arch attestation of this note for management and plan.

## 2016-03-10 DIAGNOSIS — D649 Anemia, unspecified: Secondary | ICD-10-CM | POA: Diagnosis not present

## 2016-03-10 DIAGNOSIS — J00 Acute nasopharyngitis [common cold]: Secondary | ICD-10-CM | POA: Diagnosis not present

## 2016-03-10 DIAGNOSIS — R935 Abnormal findings on diagnostic imaging of other abdominal regions, including retroperitoneum: Secondary | ICD-10-CM | POA: Diagnosis not present

## 2016-03-10 DIAGNOSIS — D3002 Benign neoplasm of left kidney: Secondary | ICD-10-CM | POA: Diagnosis not present

## 2016-03-10 DIAGNOSIS — R58 Hemorrhage, not elsewhere classified: Secondary | ICD-10-CM | POA: Diagnosis not present

## 2016-03-17 DIAGNOSIS — D3000 Benign neoplasm of unspecified kidney: Secondary | ICD-10-CM | POA: Diagnosis not present

## 2016-03-17 DIAGNOSIS — D3012 Benign neoplasm of left renal pelvis: Secondary | ICD-10-CM | POA: Diagnosis not present

## 2016-03-17 DIAGNOSIS — C642 Malignant neoplasm of left kidney, except renal pelvis: Secondary | ICD-10-CM | POA: Diagnosis not present

## 2016-03-21 ENCOUNTER — Other Ambulatory Visit: Payer: Self-pay | Admitting: Internal Medicine

## 2016-03-21 DIAGNOSIS — Z1231 Encounter for screening mammogram for malignant neoplasm of breast: Secondary | ICD-10-CM

## 2016-03-24 ENCOUNTER — Other Ambulatory Visit: Payer: Self-pay | Admitting: Radiology

## 2016-03-24 DIAGNOSIS — D3002 Benign neoplasm of left kidney: Secondary | ICD-10-CM

## 2016-03-26 ENCOUNTER — Ambulatory Visit
Admission: RE | Admit: 2016-03-26 | Discharge: 2016-03-26 | Disposition: A | Discharge status: Home / Self Care | Payer: Medicare Other | Source: Ambulatory Visit | Attending: Radiology | Admitting: Radiology

## 2016-03-26 DIAGNOSIS — K661 Hemoperitoneum: Secondary | ICD-10-CM | POA: Diagnosis not present

## 2016-03-26 DIAGNOSIS — D3002 Benign neoplasm of left kidney: Secondary | ICD-10-CM

## 2016-03-26 HISTORY — PX: IR GENERIC HISTORICAL: IMG1180011

## 2016-03-26 NOTE — Progress Notes (Signed)
Interventional Radiology Progress Note   Ms Anna Hoffman is a 81 yo female presenting today to [facility] to discuss the results of CT+c of Abd/Pelvis performed 03/17/2016.  This was a follow up CT after spontaneous left AML hemorrhage.    She was admitted to [facility] 02/04/2016 through the ED after hemorrhage, with pain, and was DC'd on 7/7.    She has followed up with Dr. Louis Meckel of Urology, and was also in our office 03/06/2016.  Her Ct was performed 8/14 and shows significant decrease left perinephric hemorrhage.  There is questionable increased size of the left AML, though how much is related to residual hematoma is difficult.    Today we reviewed her results, and her letter from Dr. Carlton Adam office.  We are in agreement that angiogram and embolization is a very reasonable treatment for her symptomatic AML.    I have again discussed the procedure, benefits of nephron-sparing procedure, and the risks.  She would like to proceed with embo. We will plan on scheduling with Dr. Earleen Newport at [facility] or secondly at [facility].   Signed,  Dulcy Fanny. Earleen Newport, DO

## 2016-03-27 ENCOUNTER — Ambulatory Visit
Admission: RE | Admit: 2016-03-27 | Discharge: 2016-03-27 | Disposition: A | Discharge status: Home / Self Care | Payer: Medicare Other | Source: Ambulatory Visit | Attending: Internal Medicine | Admitting: Internal Medicine

## 2016-03-27 DIAGNOSIS — Z1231 Encounter for screening mammogram for malignant neoplasm of breast: Secondary | ICD-10-CM | POA: Diagnosis not present

## 2016-04-01 ENCOUNTER — Other Ambulatory Visit (HOSPITAL_COMMUNITY): Payer: Self-pay | Admitting: Interventional Radiology

## 2016-04-01 DIAGNOSIS — D3002 Benign neoplasm of left kidney: Secondary | ICD-10-CM

## 2016-04-08 ENCOUNTER — Other Ambulatory Visit: Payer: Self-pay | Admitting: Radiology

## 2016-04-10 ENCOUNTER — Ambulatory Visit (HOSPITAL_COMMUNITY)
Admission: RE | Admit: 2016-04-10 | Discharge: 2016-04-10 | Disposition: A | Discharge status: Home / Self Care | Payer: Medicare Other | Source: Ambulatory Visit | Attending: Interventional Radiology | Admitting: Interventional Radiology

## 2016-04-10 ENCOUNTER — Other Ambulatory Visit (HOSPITAL_COMMUNITY): Payer: Self-pay | Admitting: Interventional Radiology

## 2016-04-10 ENCOUNTER — Encounter (HOSPITAL_COMMUNITY): Payer: Self-pay

## 2016-04-10 DIAGNOSIS — E119 Type 2 diabetes mellitus without complications: Secondary | ICD-10-CM | POA: Insufficient documentation

## 2016-04-10 DIAGNOSIS — D3000 Benign neoplasm of unspecified kidney: Secondary | ICD-10-CM | POA: Diagnosis not present

## 2016-04-10 DIAGNOSIS — I10 Essential (primary) hypertension: Secondary | ICD-10-CM | POA: Insufficient documentation

## 2016-04-10 DIAGNOSIS — E876 Hypokalemia: Secondary | ICD-10-CM | POA: Diagnosis not present

## 2016-04-10 DIAGNOSIS — D3002 Benign neoplasm of left kidney: Secondary | ICD-10-CM

## 2016-04-10 DIAGNOSIS — D1771 Benign lipomatous neoplasm of kidney: Secondary | ICD-10-CM | POA: Insufficient documentation

## 2016-04-10 DIAGNOSIS — Z882 Allergy status to sulfonamides status: Secondary | ICD-10-CM | POA: Diagnosis not present

## 2016-04-10 DIAGNOSIS — C92Z0 Other myeloid leukemia not having achieved remission: Secondary | ICD-10-CM | POA: Insufficient documentation

## 2016-04-10 DIAGNOSIS — Z880 Allergy status to penicillin: Secondary | ICD-10-CM | POA: Diagnosis not present

## 2016-04-10 HISTORY — PX: IR GENERIC HISTORICAL: IMG1180011

## 2016-04-10 LAB — CBC WITH DIFFERENTIAL/PLATELET
BASOS ABS: 0 10*3/uL (ref 0.0–0.1)
BASOS PCT: 1 %
EOS ABS: 0.2 10*3/uL (ref 0.0–0.7)
EOS PCT: 2 %
HCT: 40.5 % (ref 36.0–46.0)
Hemoglobin: 13.2 g/dL (ref 12.0–15.0)
Lymphocytes Relative: 28 %
Lymphs Abs: 1.9 10*3/uL (ref 0.7–4.0)
MCH: 29.5 pg (ref 26.0–34.0)
MCHC: 32.6 g/dL (ref 30.0–36.0)
MCV: 90.6 fL (ref 78.0–100.0)
MONO ABS: 0.3 10*3/uL (ref 0.1–1.0)
Monocytes Relative: 5 %
Neutro Abs: 4.2 10*3/uL (ref 1.7–7.7)
Neutrophils Relative %: 64 %
PLATELETS: 355 10*3/uL (ref 150–400)
RBC: 4.47 MIL/uL (ref 3.87–5.11)
RDW: 16.1 % — AB (ref 11.5–15.5)
WBC: 6.6 10*3/uL (ref 4.0–10.5)

## 2016-04-10 LAB — BASIC METABOLIC PANEL
Anion gap: 9 (ref 5–15)
BUN: 17 mg/dL (ref 6–20)
CALCIUM: 9.4 mg/dL (ref 8.9–10.3)
CO2: 25 mmol/L (ref 22–32)
CREATININE: 0.86 mg/dL (ref 0.44–1.00)
Chloride: 103 mmol/L (ref 101–111)
GFR calc Af Amer: >60 mL/min (ref >60)
Glucose, Bld: 96 mg/dL (ref 65–99)
POTASSIUM: 3.8 mmol/L (ref 3.5–5.1)
SODIUM: 137 mmol/L (ref 135–145)

## 2016-04-10 LAB — GLUCOSE, CAPILLARY: GLUCOSE-CAPILLARY: 84 mg/dL (ref 65–99)

## 2016-04-10 LAB — PROTIME-INR
INR: 1.02
PROTHROMBIN TIME: 13.4 s (ref 11.4–15.2)

## 2016-04-10 MED ORDER — SODIUM CHLORIDE 0.9 % IV SOLN
INTRAVENOUS | Status: DC
  Administered 2016-04-10: 10:00:00 via INTRAVENOUS

## 2016-04-10 MED ORDER — FENTANYL CITRATE (PF) 100 MCG/2ML IJ SOLN
INTRAMUSCULAR | Status: AC
  Filled 2016-04-10: qty 4

## 2016-04-10 MED ORDER — FENTANYL CITRATE (PF) 100 MCG/2ML IJ SOLN
INTRAMUSCULAR | Status: AC | PRN
  Administered 2016-04-10: 50 ug via INTRAVENOUS

## 2016-04-10 MED ORDER — LIDOCAINE HCL 1 % IJ SOLN
INTRAMUSCULAR | Status: AC
  Filled 2016-04-10: qty 20

## 2016-04-10 MED ORDER — ACETAMINOPHEN 325 MG PO TABS
650.0000 mg | ORAL_TABLET | Freq: Once | ORAL | Status: AC
  Administered 2016-04-10: 650 mg via ORAL
  Filled 2016-04-10: qty 2

## 2016-04-10 MED ORDER — MIDAZOLAM HCL 2 MG/2ML IJ SOLN
INTRAMUSCULAR | Status: AC | PRN
  Administered 2016-04-10 (×3): 0.5 mg via INTRAVENOUS
  Administered 2016-04-10: 1 mg via INTRAVENOUS
  Administered 2016-04-10 (×2): 0.5 mg via INTRAVENOUS

## 2016-04-10 MED ORDER — IOPAMIDOL (ISOVUE-300) INJECTION 61%
50.0000 mL | Freq: Once | INTRAVENOUS | Status: AC | PRN
  Administered 2016-04-10: 7 mL via INTRAVENOUS

## 2016-04-10 MED ORDER — IOPAMIDOL (ISOVUE-300) INJECTION 61%
100.0000 mL | Freq: Once | INTRAVENOUS | Status: AC | PRN
  Administered 2016-04-10: 52 mL via INTRAVENOUS

## 2016-04-10 MED ORDER — IOPAMIDOL (ISOVUE-300) INJECTION 61%
100.0000 mL | Freq: Once | INTRAVENOUS | Status: AC | PRN
  Administered 2016-04-10: 48 mL via INTRAVENOUS

## 2016-04-10 MED ORDER — MIDAZOLAM HCL 2 MG/2ML IJ SOLN
INTRAMUSCULAR | Status: AC
  Filled 2016-04-10: qty 6

## 2016-04-10 MED ORDER — GELATIN ABSORBABLE 12-7 MM EX MISC
CUTANEOUS | Status: AC
  Filled 2016-04-10: qty 1

## 2016-04-10 NOTE — Sedation Documentation (Addendum)
5Fr sheath removed from R fem artery by Dr. Earleen Newport. Hemostasis achieved using Exoseal closure device. R groin unremarkable,  2+RDP.

## 2016-04-10 NOTE — Discharge Instructions (Signed)
Angiogram, Care After These instructions give you information about caring for yourself after your procedure. Your doctor may also give you more specific instructions. Call your doctor if you have any problems or questions after your procedure.  HOME CARE  Take medicines only as told by your doctor.  Follow your doctor's instructions about:  Care of the area where the tube was inserted.  Bandage (dressing) changes and removal.  You may shower 24-48 hours after the procedure or as told by your doctor.  Do not take baths, swim, or use a hot tub until your doctor approves.  Every day, check the area where the tube was inserted. Watch for:  Redness, swelling, or pain.  Fluid, blood, or pus.  Do not apply powder or lotion to the site.  Do not lift anything that is heavier than 10 lb (4.5 kg) for 5 days or as told by your doctor.  Ask your doctor when you can:  Return to work or school.  Do physical activities or play sports.  Have sex.  Do not drive or operate heavy machinery for 24 hours or as told by your doctor.  Have someone with you for the first 24 hours after the procedure.  Keep all follow-up visits as told by your doctor. This is important. GET HELP IF:  You have a fever.   You have chills.   You have more bleeding from the area where the tube was inserted. Hold pressure on the area.  You have redness, swelling, or pain in the area where the tube was inserted.  You have fluid or pus coming from the area. GET HELP RIGHT AWAY IF:   You have a lot of pain in the area where the tube was inserted.  The area where the tube was inserted is bleeding, and the bleeding does not stop after 30 minutes of holding steady pressure on the area.  The area near or just beyond the insertion site becomes pale, cool, tingly, or numb.   This information is not intended to replace advice given to you by your health care provider. Make sure you discuss any questions you have  with your health care provider.   Document Released: 10/17/2008 Document Revised: 08/11/2014 Document Reviewed: 12/22/2012 Elsevier Interactive Patient Education 2016 Elsevier Inc.  Moderate Conscious Sedation, Adult, Care After Refer to this sheet in the next few weeks. These instructions provide you with information on caring for yourself after your procedure. Your health care provider may also give you more specific instructions. Your treatment has been planned according to current medical practices, but problems sometimes occur. Call your health care provider if you have any problems or questions after your procedure. WHAT TO EXPECT AFTER THE PROCEDURE  After your procedure:  You may feel sleepy, clumsy, and have poor balance for several hours.  Vomiting may occur if you eat too soon after the procedure. HOME CARE INSTRUCTIONS  Do not participate in any activities where you could become injured for at least 24 hours. Do not:  Drive.  Swim.  Ride a bicycle.  Operate heavy machinery.  Cook.  Use power tools.  Climb ladders.  Work from a high place.  Do not make important decisions or sign legal documents until you are improved.  If you vomit, drink water, juice, or soup when you can drink without vomiting. Make sure you have little or no nausea before eating solid foods.  Only take over-the-counter or prescription medicines for pain, discomfort, or fever as directed  by your health care provider.  Make sure you and your family fully understand everything about the medicines given to you, including what side effects may occur.  You should not drink alcohol, take sleeping pills, or take medicines that cause drowsiness for at least 24 hours.  If you smoke, do not smoke without supervision.  If you are feeling better, you may resume normal activities 24 hours after you were sedated.  Keep all appointments with your health care provider. SEEK MEDICAL CARE IF:  Your skin  is pale or bluish in color.  You continue to feel nauseous or vomit.  Your pain is getting worse and is not helped by medicine.  You have bleeding or swelling.  You are still sleepy or feeling clumsy after 24 hours. SEEK IMMEDIATE MEDICAL CARE IF:  You develop a rash.  You have difficulty breathing.  You develop any type of allergic problem.  You have a fever. MAKE SURE YOU:  Understand these instructions.  Will watch your condition.  Will get help right away if you are not doing well or get worse.   This information is not intended to replace advice given to you by your health care provider. Make sure you discuss any questions you have with your health care provider.   Document Released: 05/11/2013 Document Revised: 08/11/2014 Document Reviewed: 05/11/2013 Elsevier Interactive Patient Education [employer].

## 2016-04-10 NOTE — Procedures (Signed)
Interventional Radiology Procedure Note  Procedure: US guided right CFA access, left renal angiogram, and selective particle/gelfoam embolization of left renal AML.  ~8cc of 100-334micron beads, with gelfoam slurry.  Exoseal deployed.  Complications: None. Recommendations:  - Supine recovery x 4 hours - right hip straight x 4 hours - Anticipate DC after 4 hour recovery - Routine dressing, with band-aid or pressure dressing.  - Do not submerge for 7 days - Diet OK when recovered from sedation - follow up with Dr. Earleen Newport at [facility] clinic in 3-4 weeks  Signed,  Dulcy Fanny. Earleen Newport, DO

## 2016-04-10 NOTE — Progress Notes (Signed)
Patient ID: Anna Hoffman, female   DOB: August 12, 1934, 81 y.o.   MRN: [id number]    Referring Physician(s): Herrick,B  Supervising Physician: Corrie Mckusick  Patient Status:  Outpatient  Chief Complaint:  Left renal angiomyolipoma  Subjective: Patient familiar to IR service from consultation with Dr. Earleen Newport on 02/05/16. She underwent evaluation at the time secondary to acute hemorrhage from a left AML. She received blood transfusion and hemoglobin subsequently stabilized with no further bleeding. She has been on observation since that time. Recent CT of abdomen/ pelvis performed on 03/17/16 showed significant decrease in the left perinephric hemorrhage. There was questionable increase in size of the left AML though how much was related to residual hematoma was difficult to determine. Following discussion with Dr. Louis Meckel she was deemed an appropriate candidate for renal arteriogram with possible embolization of the AML. She presents today for the procedure. She currently denies fever, chest pain, dyspnea, nausea, vomiting or abnormal bleeding. She does have occasional headaches, some intermittent left lower quadrant and back discomfort and occasional cough secondary to allergies. Additional history as below. Past Medical History:  Diagnosis Date  . Diabetes mellitus without complication ([place])   . Hypertension   . Hypokalemia    Past Surgical History:  Procedure Laterality Date  . ABDOMINAL HYSTERECTOMY    . ESOPHAGOGASTRODUODENOSCOPY N/A 09/01/2014   Procedure: ESOPHAGOGASTRODUODENOSCOPY (EGD);  Surgeon: Lear Ng, MD;  Location: [facility];  Service: Endoscopy;  Laterality: N/A;  . HIATAL HERNIA REPAIR N/A 09/04/2014   Procedure: LAPAROSCOPIC REPAIR OF HIATAL HERNIA;  Surgeon: Excell Seltzer, MD;  Location: [facility];  Service: General;  Laterality: N/A;  With MESH  . KNEE SURGERY        Allergies: Amoxicillin and Sulfa antibiotics  Medications: Prior to Admission medications     Medication Sig Start Date End Date Taking? Authorizing Provider  acetaminophen (TYLENOL) 325 MG tablet Take 2 tablets (650 mg total) by mouth every 6 (six) hours as needed for mild pain (or Fever >/= 101). 02/08/16  Yes Cl[place]ford Marisa Hua, MD  Ascorbic Acid (VITAMIN C) 1000 MG tablet Take 1,000 mg by mouth daily.   Yes Historical Provider, MD  fexofenadine (ALLEGRA) 180 MG tablet Take 180 mg by mouth daily.   Yes Historical Provider, MD  Multiple Vitamins-Minerals (STRESS TAB NF PO) Take 1 tablet by mouth daily.   Yes Historical Provider, MD  Olmesartan-Amlodipine-HCTZ (TRIBENZOR) 20-5-12.5 MG TABS Take 1 tablet by mouth daily.   Yes Historical Provider, MD  rosuvastatin (CRESTOR) 10 MG tablet Take 10 mg by mouth daily.   Yes Historical Provider, MD  vitamin B-12 (CYANOCOBALAMIN) 1000 MCG tablet Take 1,000 mcg by mouth daily.   Yes Historical Provider, MD     Vital Signs: BP 146/71  HR 81  R 16  TEMP 98.4  O2 SATS 99% RA   Physical Exam  patient awake, alert. Chest clear to auscultation bilaterally. Heart with regular rate and rhythm. Abdomen soft, positive bowel sounds, mild left lower quadrant tenderness to palpation; lower extremities with no edema and intact distal pulses Imaging: No results found.  Labs:  CBC:  Recent Labs  02/06/16 0101  02/07/16 0447 02/07/16 1200 02/08/16 0433 04/10/16 1007  WBC 9.6  --  11.4*  --  9.7 6.6  HGB 8.9*  < > 10.4* 10.5* 10.9* 13.2  HCT 26.0*  < > 29.7* 30.5* 31.2* 40.5  PLT 250  --  295  --  339 355  < > = values in this interval  not displayed.  COAGS:  Recent Labs  02/05/16 0858 04/10/16 1007  INR 1.17 1.02  APTT 25  --     BMP:  Recent Labs  02/04/16 1255 02/05/16 0312 04/10/16 1007  NA 139 139 137  K 3.2* 3.5 3.8  CL 107 112* 103  CO2 20* 21* 25  GLUCOSE 174* 102* 96  BUN 22* 21* 17  CALCIUM 8.8* 8.1* 9.4  CREATININE 1.16* 0.89 0.86  GFRNONAA 44* >60 >60  GFRAA 51* >60 >60    LIVER FUNCTION TESTS:  Recent  Labs  02/04/16 1255 02/05/16 0312  BILITOT 0.8 0.6  AST 26 18  ALT 17 14  ALKPHOS 60 47  PROT 6.4* 5.5*  ALBUMIN 3.9 3.3*    Assessment and Plan: Patient with history of extraperitoneal hemorrhage secondary to the left renal AML in July of this year with prior blood transfusion but no further intervention. Seen recently in consultation by Dr. Earleen Newport with follow up CT revealing significant decrease in the left perinephric hemorrhage; following discussion with Dr. Louis Meckel patient deemed an appropriate candidate for renal arteriogram with possible embolization of the left renal AML. She presents today for the procedure. Details/risks of procedure, including but not limited to, internal bleeding, infection, nontarget embolization, inability to catheterize vessels necessary  for embolization discussed with patient with her understanding and consent.   Electronically Signed: D. Rowe Robert 04/10/2016, 10:57 AM   I spent a total of 25 minutes at the the patient's bedside AND on the patient's hospital floor or unit, greater than 50% of which was counseling/coordinating care for renal arteriogram with possible embolization of left renal AML

## 2016-04-11 DIAGNOSIS — H9190 Unspecified hearing loss, unspecified ear: Secondary | ICD-10-CM | POA: Diagnosis not present

## 2016-04-11 DIAGNOSIS — I129 Hypertensive chronic kidney disease with stage 1 through stage 4 chronic kidney disease, or unspecified chronic kidney disease: Secondary | ICD-10-CM | POA: Diagnosis not present

## 2016-04-11 DIAGNOSIS — Z0100 Encounter for examination of eyes and vision without abnormal findings: Secondary | ICD-10-CM | POA: Diagnosis not present

## 2016-04-11 DIAGNOSIS — R935 Abnormal findings on diagnostic imaging of other abdominal regions, including retroperitoneum: Secondary | ICD-10-CM | POA: Diagnosis not present

## 2016-04-11 DIAGNOSIS — N183 Chronic kidney disease, stage 3 (moderate): Secondary | ICD-10-CM | POA: Diagnosis not present

## 2016-04-11 DIAGNOSIS — Z79899 Other long term (current) drug therapy: Secondary | ICD-10-CM | POA: Diagnosis not present

## 2016-04-14 ENCOUNTER — Other Ambulatory Visit: Payer: Self-pay | Admitting: Interventional Radiology

## 2016-04-14 DIAGNOSIS — D3002 Benign neoplasm of left kidney: Secondary | ICD-10-CM

## 2016-04-29 ENCOUNTER — Ambulatory Visit
Admission: RE | Admit: 2016-04-29 | Discharge: 2016-04-29 | Disposition: A | Discharge status: Home / Self Care | Payer: Medicare Other | Source: Ambulatory Visit | Attending: Interventional Radiology | Admitting: Interventional Radiology

## 2016-04-29 DIAGNOSIS — D3002 Benign neoplasm of left kidney: Secondary | ICD-10-CM | POA: Diagnosis not present

## 2016-04-29 DIAGNOSIS — D3000 Benign neoplasm of unspecified kidney: Secondary | ICD-10-CM | POA: Diagnosis not present

## 2016-04-29 HISTORY — PX: IR GENERIC HISTORICAL: IMG1180011

## 2016-04-29 NOTE — Progress Notes (Signed)
Chief Complaint: Follow up for left renal AML  Referring Physician(s): Dr. Dreama Saa  PCP: Dr. Glendale Chard  Urology: Dr. Louis Meckel & Dr. Risa Grill  History of Present Illness: Anna Hoffman is a 81 y.o. female returning today as a scheduled follow-up to vascular and interventional radiology, status post embolization of left renal angiomyolipoma.  She had a hemorrhagic event and hospitalization in July 2017, with both urology and vascular interventional radiology consultation.  For the purposes of nephron sparing surgery, she underwent catheter directed embolization 04/10/2016.  She was discharged on the same day of the procedure, and tells me she had no problem with her recovery. She has had no recurrent flank pain, and had no problem with the puncture site at the common femoral artery.  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 She tells me this pain resolved with Tylenol, and she does not currently have any pain.  She also tells me that she has had recurrent pain at her right knee which shoots from her calf to her knee. She does tell me that this is familiar to her, as she has seen Dr. Percell Miller or potential right knee replacement and the pain has been recurrent. She denies any lower extremity warmth, swelling, or asymmetry. She has not had any long car rides or recent hospitalizations.  She denies any hematuria or other urinary symptoms. She denies flank pain.  For the most part, she is quite satisfied after our treatment.  Past Medical History:  Diagnosis Date  . Diabetes mellitus without complication ([place])   . Hypertension   . Hypokalemia     Past Surgical History:  Procedure Laterality Date  . ABDOMINAL HYSTERECTOMY    . ESOPHAGOGASTRODUODENOSCOPY N/A 09/01/2014   Procedure: ESOPHAGOGASTRODUODENOSCOPY (EGD);  Surgeon: Lear Ng, MD;  Location: [facility];  Service: Endoscopy;  Laterality: N/A;  .  HIATAL HERNIA REPAIR N/A 09/04/2014   Procedure: LAPAROSCOPIC REPAIR OF HIATAL HERNIA;  Surgeon: Excell Seltzer, MD;  Location: [facility];  Service: General;  Laterality: N/A;  With MESH  . IR GENERIC HISTORICAL  04/10/2016   IR US GUIDE VASC ACCESS RIGHT 04/10/2016 Corrie Mckusick, DO [facility]  . IR GENERIC HISTORICAL  04/10/2016   IR ANGIOGRAM SELECTIVE EACH ADDITIONAL VESSEL 04/10/2016 Corrie Mckusick, DO [facility]  . IR GENERIC HISTORICAL  04/10/2016   IR ANGIOGRAM SELECTIVE EACH ADDITIONAL VESSEL 04/10/2016 Corrie Mckusick, DO [facility]  . IR GENERIC HISTORICAL  04/10/2016   IR EMBO TUMOR ORGAN ISCHEMIA INFARCT INC GUIDE ROADMAPPING 04/10/2016 Corrie Mckusick, DO [facility]  . IR GENERIC HISTORICAL  04/10/2016   IR ANGIOGRAM SELECTIVE EACH ADDITIONAL VESSEL 04/10/2016 Corrie Mckusick, DO [facility]  . IR GENERIC HISTORICAL  04/10/2016   IR ANGIOGRAM SELECTIVE EACH ADDITIONAL VESSEL 04/10/2016 Corrie Mckusick, DO [facility]  . IR GENERIC HISTORICAL  04/10/2016   IR RENAL SELECTIVE  UNI INC S&I MOD SED 04/10/2016 Corrie Mckusick, DO [facility]  . KNEE SURGERY      Allergies: Amoxicillin and Sulfa antibiotics  Medications: Prior to Admission medications   Medication Sig Start Date End Date Taking? Authorizing Provider  acetaminophen (TYLENOL) 325 MG tablet Take 2 tablets (650 mg total) by mouth every 6 (six) hours as needed for mild pain (or Fever >/= 101). 02/08/16  Yes [place] Marisa Hua, MD  Ascorbic Acid (VITAMIN C) 1000 MG tablet Take 1,000 mg by mouth daily.   Yes Historical Provider, MD  fexofenadine (ALLEGRA) 180 MG tablet Take 180 mg by mouth daily.   Yes Historical Provider, MD  Multiple Vitamins-Minerals (STRESS TAB NF PO) Take 1 tablet by mouth daily.   Yes Historical Provider, MD  Olmesartan-Amlodipine-HCTZ (TRIBENZOR) 20-5-12.5 MG TABS Take 1 tablet by mouth daily.   Yes Historical Provider, MD  rosuvastatin (CRESTOR) 10 MG tablet Take 10 mg by mouth daily.   Yes Historical Provider, MD  vitamin  B-12 (CYANOCOBALAMIN) 1000 MCG tablet Take 1,000 mcg by mouth daily.   Yes Historical Provider, MD     Family History  Problem Relation Age of Onset  . Prostate cancer Father     Social History   Social History  . Marital status: Widowed    Spouse name: N/A  . Number of children: N/A  . Years of education: N/A   Social History Main Topics  . Smoking status: Never Smoker  . Smokeless tobacco: Never Used  . Alcohol use No  . Drug use: No  . Sexual activity: No   Other Topics Concern  . Not on file   Social History Narrative  . No narrative on file       Review of Systems: A 12 point ROS discussed and pertinent positives are indicated in the HPI above.  All other systems are negative.  Review of Systems  Vital Signs: BP (!) 157/95 (BP Location: Left Arm, Patient Position: Sitting, Cuff Size: Normal)   Pulse 90   Temp 98.1 F (36.7 C) (Oral)   Resp 16   Ht 5' 5.5" (1.664 m)   Wt 127 lb (57.6 kg)   SpO2 97%   BMI 20.81 kg/m   Physical Exam  Palpable popliteal pulses. Palpable common femoral pulse with no tenderness or evidence of residual hematoma. No lower extremity swelling or warmth. Symmetric appearance of the left and the right calf. Negative Homans sign.  Mallampati Score:     Imaging: Ir Angiogram Renal Left Selective  Result Date: 04/10/2016 INDICATION: 82 year old female with a history of hemorrhage of angiomyolipoma. She returns today for embolization. EXAM: IR EMBO TUMOR ORGAN ISCHEMIA INFARCT INC GUIDE ROADMAPPING; IR ULTRASOUND GUIDANCE VASC ACCESS RIGHT; ADDITIONAL ARTERIOGRAPHY; IR RENAL SELECTIVE UNI INC S+I MODERATE SEDATION MEDICATIONS: None ANESTHESIA/SEDATION: Moderate (conscious) sedation was employed during this procedure. A total of Versed 3.5 mg and Fentanyl 100 mcg was administered intravenously. Moderate Sedation Time: 67 minutes. The patient's level of consciousness and vital signs were monitored continuously by radiology nursing  throughout the procedure under my direct supervision. CONTRAST:  110 cc FLUOROSCOPY TIME:  Fluoroscopy Time: 14 minutes 12 seconds (372 mGy). COMPLICATIONS: None PROCEDURE: Informed consent was obtained from the patient following explanation of the procedure, risks, benefits and alternatives. The patient understands, agrees and consents for the procedure. All questions were addressed. A time out was performed prior to the initiation of the procedure. Maximal barrier sterile technique utilized including caps, mask, sterile gowns, sterile gloves, large sterile drape, hand hygiene, and Betadine prep. Ultrasound survey of the right inguinal region was performed with images stored and sent to PACs. A micropuncture needle was used access the right common femoral artery under ultrasound. With excellent arterial blood flow returned, and an .018 micro wire was passed through the needle, observed enter the abdominal aorta under fluoroscopy. The needle was removed, and a micropuncture sheath was placed over the wire. The inner dilator and wire were removed, and an 035 Bentson wire was advanced under fluoroscopy into the abdominal aorta. The sheath was removed and  a standard 5 [place] vascular sheath was placed. The dilator was removed and the sheath was flushed. C2 Cobra catheter was advanced over the Bentson wire. Wire was removed and left renal artery was selected. Left radial artery angiogram performed. Cobra catheter was advanced into the mid renal artery over a standard Glidewire and repeat angiogram was performed. Micro catheter system was then introduced using a high-flow (027 lumen) micro catheter and 014 Fathom wire. Sequential sub selection of the segmental arteries at the upper pole collecting system was performed, with sequential sub selection of inter lobar arteries. Once the abnormal vasculature contributing to the tumor was identified, selective embolization with micro particles (Embosphere 100-300) was  performed. Approximately 8 cc of solution was used. Intermittent selective angiogram was performed during the embolization. Finally, Gel-Foam slurry was infused into the segmental artery to achieve near complete hemostasis. The micro catheter system was removed and a final angiogram was performed. Limited angiogram of the right common femoral artery was performed. Patient tolerated the procedure well and remained hemodynamically stable throughout. No complications were encountered and no significant blood loss encountered. FINDINGS: Left renal angiogram demonstrates no early segmental arteries, or early divisions of the left renal artery. Your icteric artery not identified. No significant atherosclerotic disease in the mid or distal left renal artery. No dissection or aneurysm. No beading. 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 Deployment of Exoseal for right common femoral artery hemostasis. Signed, Dulcy Fanny. Earleen Newport, DO [facility] Radiology Electronically Signed   By: Corrie Mckusick D.O.   On: 04/10/2016 16:16   Ir Angiogram Selective Each Additional Vessel  Result Date: 04/10/2016 INDICATION: 81 year old female with a history of hemorrhage of angiomyolipoma. She returns today for embolization. EXAM: IR EMBO TUMOR ORGAN ISCHEMIA INFARCT INC GUIDE ROADMAPPING; IR ULTRASOUND GUIDANCE VASC ACCESS RIGHT; ADDITIONAL ARTERIOGRAPHY; IR RENAL SELECTIVE UNI INC S+I MODERATE SEDATION MEDICATIONS: None ANESTHESIA/SEDATION: Moderate (conscious) sedation was employed during this procedure. A total of Versed 3.5 mg and Fentanyl 100 mcg was administered intravenously. Moderate Sedation Time: 67 minutes. The patient's level of consciousness and vital  signs were monitored continuously by radiology nursing throughout the procedure under my direct supervision. CONTRAST:  110 cc FLUOROSCOPY TIME:  Fluoroscopy Time: 14 minutes 12 seconds (372 mGy). COMPLICATIONS: None PROCEDURE: Informed consent was obtained from the patient following explanation of the procedure, risks, benefits and alternatives. The patient understands, agrees and consents for the procedure. All questions were addressed. A time out was performed prior to the initiation of the procedure. Maximal barrier sterile technique utilized including caps, mask, sterile gowns, sterile gloves, large sterile drape, hand hygiene, and Betadine prep. Ultrasound survey of the right inguinal region was performed with images stored and sent to PACs. A micropuncture needle was used access the right common femoral artery under ultrasound. With excellent arterial blood flow returned, and an .018 micro wire was passed through the needle, observed enter the abdominal aorta under fluoroscopy. The needle was removed, and a micropuncture sheath was placed over the wire. The inner dilator and wire were removed, and an 035 Bentson wire was advanced under fluoroscopy into the abdominal aorta. The sheath was removed and a standard 5 [place] vascular sheath was placed. The dilator was removed and the sheath was flushed. C2 Cobra catheter was advanced over the [place]  wire. Wire was removed and left renal artery was selected. Left radial artery angiogram performed. Cobra catheter was advanced into the mid renal artery over a standard Glidewire and repeat angiogram was performed. Micro catheter system was then introduced using a high-flow (027 lumen) micro catheter and 014 Fathom wire. Sequential sub selection of the segmental arteries at the upper pole collecting system was performed, with sequential sub selection of inter lobar arteries. Once the abnormal vasculature contributing to the tumor was identified, selective embolization  with micro particles (Embosphere 100-300) was performed. Approximately 8 cc of solution was used. Intermittent selective angiogram was performed during the embolization. Finally, Gel-Foam slurry was infused into the segmental artery to achieve near complete hemostasis. The micro catheter system was removed and a final angiogram was performed. Limited angiogram of the right common femoral artery was performed. Patient tolerated the procedure well and remained hemodynamically stable throughout. No complications were encountered and no significant blood loss encountered. FINDINGS: Left renal angiogram demonstrates no early segmental arteries, or early divisions of the left renal artery. Your icteric artery not identified. No significant atherosclerotic disease in the mid or distal left renal artery. No dissection or aneurysm. No beading. 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 Deployment of Exoseal for right common femoral artery hemostasis. Signed, Dulcy Fanny. Earleen Newport, DO [facility] Radiology Electronically Signed   By: Corrie Mckusick D.O.   On: 04/10/2016 16:16   Ir Angiogram Selective Each Additional Vessel  Result Date: 04/10/2016 INDICATION: 81 year old female with a history of hemorrhage of angiomyolipoma. She returns today for embolization. EXAM: IR EMBO TUMOR ORGAN ISCHEMIA INFARCT INC GUIDE ROADMAPPING; IR ULTRASOUND GUIDANCE VASC ACCESS RIGHT; ADDITIONAL ARTERIOGRAPHY; IR RENAL SELECTIVE UNI INC S+I MODERATE SEDATION MEDICATIONS: None ANESTHESIA/SEDATION: Moderate (conscious) sedation was employed during this procedure. A total of Versed 3.5 mg and Fentanyl 100 mcg was administered intravenously. Moderate Sedation Time: 67 minutes.  The patient's level of consciousness and vital signs were monitored continuously by radiology nursing throughout the procedure under my direct supervision. CONTRAST:  110 cc FLUOROSCOPY TIME:  Fluoroscopy Time: 14 minutes 12 seconds (372 mGy). COMPLICATIONS: None PROCEDURE: Informed consent was obtained from the patient following explanation of the procedure, risks, benefits and alternatives. The patient understands, agrees and consents for the procedure. All questions were addressed. A time out was performed prior to the initiation of the procedure. Maximal barrier sterile technique utilized including caps, mask, sterile gowns, sterile gloves, large sterile drape, hand hygiene, and Betadine prep. Ultrasound survey of the right inguinal region was performed with images stored and sent to PACs. A micropuncture needle was used access the right common femoral artery under ultrasound. With excellent arterial blood flow returned, and an .018 micro wire was passed through the needle, observed enter the abdominal aorta under fluoroscopy. The needle was removed, and a micropuncture sheath was placed over the wire. The inner dilator and wire were removed, and an 035 Bentson wire was advanced under fluoroscopy into the abdominal aorta. The sheath was removed and a standard 5 [place] vascular sheath was placed. The dilator was removed and the sheath was flushed. C2 Cobra catheter was advanced over the Bentson wire. Wire was removed and left renal artery was selected. Left radial artery angiogram performed. Cobra catheter was advanced into the mid renal artery over  a standard Glidewire and repeat angiogram was performed. Micro catheter system was then introduced using a high-flow (027 lumen) micro catheter and 014 Fathom wire. Sequential sub selection of the segmental arteries at the upper pole collecting system was performed, with sequential sub selection of inter lobar arteries. Once the abnormal vasculature contributing to  the tumor was identified, selective embolization with micro particles (Embosphere 100-300) was performed. Approximately 8 cc of solution was used. Intermittent selective angiogram was performed during the embolization. Finally, Gel-Foam slurry was infused into the segmental artery to achieve near complete hemostasis. The micro catheter system was removed and a final angiogram was performed. Limited angiogram of the right common femoral artery was performed. Patient tolerated the procedure well and remained hemodynamically stable throughout. No complications were encountered and no significant blood loss encountered. FINDINGS: Left renal angiogram demonstrates no early segmental arteries, or early divisions of the left renal artery. Your icteric artery not identified. No significant atherosclerotic disease in the mid or distal left renal artery. No dissection or aneurysm. No beading. 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 Deployment of Exoseal for right common femoral artery hemostasis. Signed, Dulcy Fanny. Earleen Newport, DO [facility] Radiology Electronically Signed   By: Corrie Mckusick D.O.   On: 04/10/2016 16:16   Ir Angiogram Selective Each Additional Vessel  Result Date: 04/10/2016 INDICATION: 81 year old female with a history of hemorrhage of angiomyolipoma. She returns today for embolization. EXAM: IR EMBO TUMOR ORGAN ISCHEMIA INFARCT INC GUIDE ROADMAPPING; IR ULTRASOUND GUIDANCE VASC ACCESS RIGHT; ADDITIONAL ARTERIOGRAPHY; IR RENAL SELECTIVE UNI INC S+I MODERATE SEDATION MEDICATIONS: None ANESTHESIA/SEDATION: Moderate (conscious) sedation was employed during this procedure. A total of Versed 3.5 mg and Fentanyl 100 mcg was administered  intravenously. Moderate Sedation Time: 67 minutes. The patient's level of consciousness and vital signs were monitored continuously by radiology nursing throughout the procedure under my direct supervision. CONTRAST:  110 cc FLUOROSCOPY TIME:  Fluoroscopy Time: 14 minutes 12 seconds (372 mGy). COMPLICATIONS: None PROCEDURE: Informed consent was obtained from the patient following explanation of the procedure, risks, benefits and alternatives. The patient understands, agrees and consents for the procedure. All questions were addressed. A time out was performed prior to the initiation of the procedure. Maximal barrier sterile technique utilized including caps, mask, sterile gowns, sterile gloves, large sterile drape, hand hygiene, and Betadine prep. Ultrasound survey of the right inguinal region was performed with images stored and sent to PACs. A micropuncture needle was used access the right common femoral artery under ultrasound. With excellent arterial blood flow returned, and an .018 micro wire was passed through the needle, observed enter the abdominal aorta under fluoroscopy. The needle was removed, and a micropuncture sheath was placed over the wire. The inner dilator and wire were removed, and an 035 Bentson wire was advanced under fluoroscopy into the abdominal aorta. The sheath was removed and a standard 5 [place] vascular sheath was placed. The dilator was removed and the sheath was flushed. C2 Cobra catheter was advanced over the Bentson wire. Wire was removed and left renal artery was selected. Left radial artery angiogram performed. Cobra catheter was advanced into the mid renal artery over a standard Glidewire and repeat angiogram was performed. Micro catheter system was then introduced using a high-flow (027 lumen) micro catheter and 014 Fathom wire.  Sequential sub selection of the segmental arteries at the upper pole collecting system was performed, with sequential sub selection of inter lobar  arteries. Once the abnormal vasculature contributing to the tumor was identified, selective embolization with micro particles (Embosphere 100-300) was performed. Approximately 8 cc of solution was used. Intermittent selective angiogram was performed during the embolization. Finally, Gel-Foam slurry was infused into the segmental artery to achieve near complete hemostasis. The micro catheter system was removed and a final angiogram was performed. Limited angiogram of the right common femoral artery was performed. Patient tolerated the procedure well and remained hemodynamically stable throughout. No complications were encountered and no significant blood loss encountered. FINDINGS: Left renal angiogram demonstrates no early segmental arteries, or early divisions of the left renal artery. Your icteric artery not identified. No significant atherosclerotic disease in the mid or distal left renal artery. No dissection or aneurysm. No beading. 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 Deployment of Exoseal for right common femoral artery hemostasis. Signed, Dulcy Fanny. Earleen Newport, DO [facility] Radiology Electronically Signed   By: Corrie Mckusick D.O.   On: 04/10/2016 16:16   Ir Angiogram Selective Each Additional Vessel  Result Date: 04/10/2016 INDICATION: 81 year old female with a history of hemorrhage of angiomyolipoma. She returns today for embolization. EXAM: IR EMBO TUMOR ORGAN ISCHEMIA INFARCT INC GUIDE ROADMAPPING; IR ULTRASOUND GUIDANCE VASC ACCESS RIGHT; ADDITIONAL ARTERIOGRAPHY; IR RENAL SELECTIVE UNI INC S+I MODERATE SEDATION MEDICATIONS: None ANESTHESIA/SEDATION: Moderate (conscious) sedation was employed during this procedure. A total  of Versed 3.5 mg and Fentanyl 100 mcg was administered intravenously. Moderate Sedation Time: 67 minutes. The patient's level of consciousness and vital signs were monitored continuously by radiology nursing throughout the procedure under my direct supervision. CONTRAST:  110 cc FLUOROSCOPY TIME:  Fluoroscopy Time: 14 minutes 12 seconds (372 mGy). COMPLICATIONS: None PROCEDURE: Informed consent was obtained from the patient following explanation of the procedure, risks, benefits and alternatives. The patient understands, agrees and consents for the procedure. All questions were addressed. A time out was performed prior to the initiation of the procedure. Maximal barrier sterile technique utilized including caps, mask, sterile gowns, sterile gloves, large sterile drape, hand hygiene, and Betadine prep. Ultrasound survey of the right inguinal region was performed with images stored and sent to PACs. A micropuncture needle was used access the right common femoral artery under ultrasound. With excellent arterial blood flow returned, and an .018 micro wire was passed through the needle, observed enter the abdominal aorta under fluoroscopy. The needle was removed, and a micropuncture sheath was placed over the wire. The inner dilator and wire were removed, and an 035 Bentson wire was advanced under fluoroscopy into the abdominal aorta. The sheath was removed and a standard 5 [place] vascular sheath was placed. The dilator was removed and the sheath was flushed. C2 Cobra catheter was advanced over the Bentson wire. Wire was removed and left renal artery was selected. Left radial artery angiogram performed. Cobra catheter was advanced into the mid renal artery over a standard Glidewire and repeat angiogram was performed. Micro catheter system was then introduced using a high-flow (027 lumen) micro catheter and 014 Fathom wire. Sequential sub selection of the segmental arteries at the upper pole collecting system was  performed, with sequential sub selection of inter lobar arteries. Once  the abnormal vasculature contributing to the tumor was identified, selective embolization with micro particles (Embosphere 100-300) was performed. Approximately 8 cc of solution was used. Intermittent selective angiogram was performed during the embolization. Finally, Gel-Foam slurry was infused into the segmental artery to achieve near complete hemostasis. The micro catheter system was removed and a final angiogram was performed. Limited angiogram of the right common femoral artery was performed. Patient tolerated the procedure well and remained hemodynamically stable throughout. No complications were encountered and no significant blood loss encountered. FINDINGS: Left renal angiogram demonstrates no early segmental arteries, or early divisions of the left renal artery. Your icteric artery not identified. No significant atherosclerotic disease in the mid or distal left renal artery. No dissection or aneurysm. No beading. 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 Deployment of Exoseal for right common femoral artery hemostasis. Signed, Dulcy Fanny. Earleen Newport, DO [facility] Radiology Electronically Signed   By: Corrie Mckusick D.O.   On: 04/10/2016 16:16   Ir US Guide Vasc Access Right  Result Date: 04/10/2016 INDICATION: 81 year old female with a history of hemorrhage of angiomyolipoma. She returns today for embolization. EXAM: IR EMBO TUMOR ORGAN ISCHEMIA INFARCT INC GUIDE ROADMAPPING; IR ULTRASOUND GUIDANCE VASC ACCESS RIGHT; ADDITIONAL ARTERIOGRAPHY; IR RENAL SELECTIVE UNI INC S+I MODERATE SEDATION MEDICATIONS: None ANESTHESIA/SEDATION: Moderate (conscious) sedation was  employed during this procedure. A total of Versed 3.5 mg and Fentanyl 100 mcg was administered intravenously. Moderate Sedation Time: 67 minutes. The patient's level of consciousness and vital signs were monitored continuously by radiology nursing throughout the procedure under my direct supervision. CONTRAST:  110 cc FLUOROSCOPY TIME:  Fluoroscopy Time: 14 minutes 12 seconds (372 mGy). COMPLICATIONS: None PROCEDURE: Informed consent was obtained from the patient following explanation of the procedure, risks, benefits and alternatives. The patient understands, agrees and consents for the procedure. All questions were addressed. A time out was performed prior to the initiation of the procedure. Maximal barrier sterile technique utilized including caps, mask, sterile gowns, sterile gloves, large sterile drape, hand hygiene, and Betadine prep. Ultrasound survey of the right inguinal region was performed with images stored and sent to PACs. A micropuncture needle was used access the right common femoral artery under ultrasound. With excellent arterial blood flow returned, and an .018 micro wire was passed through the needle, observed enter the abdominal aorta under fluoroscopy. The needle was removed, and a micropuncture sheath was placed over the wire. The inner dilator and wire were removed, and an 035 Bentson wire was advanced under fluoroscopy into the abdominal aorta. The sheath was removed and a standard 5 [place] vascular sheath was placed. The dilator was removed and the sheath was flushed. C2 Cobra catheter was advanced over the Bentson wire. Wire was removed and left renal artery was selected. Left radial artery angiogram performed. Cobra catheter was advanced into the mid renal artery over a standard Glidewire and repeat angiogram was performed. Micro catheter system was then introduced using a high-flow (027 lumen) micro catheter and 014 Fathom wire. Sequential sub selection of the segmental arteries at the  upper pole collecting system was performed, with sequential sub selection of inter lobar arteries. Once the abnormal vasculature contributing to the tumor was identified, selective embolization with micro particles (Embosphere 100-300) was performed. Approximately 8 cc of solution was used.  Intermittent selective angiogram was performed during the embolization. Finally, Gel-Foam slurry was infused into the segmental artery to achieve near complete hemostasis. The micro catheter system was removed and a final angiogram was performed. Limited angiogram of the right common femoral artery was performed. Patient tolerated the procedure well and remained hemodynamically stable throughout. No complications were encountered and no significant blood loss encountered. FINDINGS: Left renal angiogram demonstrates no early segmental arteries, or early divisions of the left renal artery. Your icteric artery not identified. No significant atherosclerotic disease in the mid or distal left renal artery. No dissection or aneurysm. No beading. 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 Deployment of Exoseal for right common femoral artery hemostasis. Signed, Dulcy Fanny. Earleen Newport, DO [facility] Radiology Electronically Signed   By: Corrie Mckusick D.O.   On: 04/10/2016 16:16   Ir Embo Tumor Organ Ischemia Infarct Inc Guide Roadmapping  Result Date: 04/10/2016 INDICATION: 81 year old female with a history of hemorrhage of angiomyolipoma. She returns today for embolization. EXAM: IR EMBO TUMOR ORGAN ISCHEMIA INFARCT INC GUIDE ROADMAPPING; IR ULTRASOUND GUIDANCE VASC ACCESS RIGHT; ADDITIONAL ARTERIOGRAPHY; IR RENAL SELECTIVE UNI INC S+I MODERATE SEDATION MEDICATIONS:  None ANESTHESIA/SEDATION: Moderate (conscious) sedation was employed during this procedure. A total of Versed 3.5 mg and Fentanyl 100 mcg was administered intravenously. Moderate Sedation Time: 67 minutes. The patient's level of consciousness and vital signs were monitored continuously by radiology nursing throughout the procedure under my direct supervision. CONTRAST:  110 cc FLUOROSCOPY TIME:  Fluoroscopy Time: 14 minutes 12 seconds (372 mGy). COMPLICATIONS: None PROCEDURE: Informed consent was obtained from the patient following explanation of the procedure, risks, benefits and alternatives. The patient understands, agrees and consents for the procedure. All questions were addressed. A time out was performed prior to the initiation of the procedure. Maximal barrier sterile technique utilized including caps, mask, sterile gowns, sterile gloves, large sterile drape, hand hygiene, and Betadine prep. Ultrasound survey of the right inguinal region was performed with images stored and sent to PACs. A micropuncture needle was used access the right common femoral artery under ultrasound. With excellent arterial blood flow returned, and an .018 micro wire was passed through the needle, observed enter the abdominal aorta under fluoroscopy. The needle was removed, and a micropuncture sheath was placed over the wire. The inner dilator and wire were removed, and an 035 Bentson wire was advanced under fluoroscopy into the abdominal aorta. The sheath was removed and a standard 5 [place] vascular sheath was placed. The dilator was removed and the sheath was flushed. C2 Cobra catheter was advanced over the Bentson wire. Wire was removed and left renal artery was selected. Left radial artery angiogram performed. Cobra catheter was advanced into the mid renal artery over a standard Glidewire and repeat angiogram was performed. Micro catheter system was then introduced using a high-flow (027 lumen) micro catheter and 014 Fathom wire.  Sequential sub selection of the segmental arteries at the upper pole collecting system was performed, with sequential sub selection of inter lobar arteries. Once the abnormal vasculature contributing to the tumor was identified, selective embolization with micro particles (Embosphere 100-300) was performed. Approximately 8 cc of solution was used. Intermittent selective angiogram was performed during the embolization. Finally, Gel-Foam slurry was infused into the segmental artery to achieve near complete hemostasis.  The micro catheter system was removed and a final angiogram was performed. Limited angiogram of the right common femoral artery was performed. Patient tolerated the procedure well and remained hemodynamically stable throughout. No complications were encountered and no significant blood loss encountered. FINDINGS: Left renal angiogram demonstrates no early segmental arteries, or early divisions of the left renal artery. Your icteric artery not identified. No significant atherosclerotic disease in the mid or distal left renal artery. No dissection or aneurysm. No beading. 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 Deployment of Exoseal for right common femoral artery hemostasis. Signed, Dulcy Fanny. Earleen Newport, DO [facility] Radiology Electronically Signed   By: Corrie Mckusick D.O.   On: 04/10/2016 16:16    Labs:  CBC:  Recent Labs  02/06/16 0101  02/07/16 0447 02/07/16 1200 02/08/16 0433 04/10/16 1007  WBC 9.6  --  11.4*  --  9.7 6.6  HGB 8.9*  < > 10.4* 10.5* 10.9* 13.2  HCT 26.0*  < > 29.7* 30.5* 31.2* 40.5  PLT 250  --  295  --  339 355  < > = values in this interval not displayed.  COAGS:  Recent Labs   02/05/16 0858 04/10/16 1007  INR 1.17 1.02  APTT 25  --     BMP:  Recent Labs  02/04/16 1255 02/05/16 0312 04/10/16 1007  NA 139 139 137  K 3.2* 3.5 3.8  CL 107 112* 103  CO2 20* 21* 25  GLUCOSE 174* 102* 96  BUN 22* 21* 17  CALCIUM 8.8* 8.1* 9.4  CREATININE 1.16* 0.89 0.86  GFRNONAA 44* >60 >60  GFRAA 51* >60 >60    LIVER FUNCTION TESTS:  Recent Labs  02/04/16 1255 02/05/16 0312  BILITOT 0.8 0.6  AST 26 18  ALT 17 14  ALKPHOS 60 47  PROT 6.4* 5.5*  ALBUMIN 3.9 3.3*    TUMOR MARKERS: No results for input(s): AFPTM, CEA, CA199, CHROMGRNA in the last 8760 hours.  Assessment and Plan:  Ms Stlaurent is a 40 -year-old female status post left renal angiomyolipoma embolization performed 04/10/2016. She has recovered well.  Regarding her right lower extremity symptoms of shooting type pain in the calf and knee, I feel these are most likely orthopedic given that she tells me this is pain that's familiar to her and she has had discussions regarding a knee replacement. I feel there is a low likelihood of a thrombus given her absent signs of DVT, and a negative Homans sign. We will defer a duplex at this time.  I think a repeat CT angiogram of the abdomen is reasonable in 6 months to evaluate our embolization of the left renal AML. This will give some time for resolution of the previous blood products.  She knows she may call us should anything change or she have any problems, we will follow up with the appointment for future CT at that time.   Electronically Signed: Corrie Mckusick 04/29/2016, 4:19 PM   I spent a total of    25 Minutes in face to face in clinical consultation, greater than 50% of which was counseling/coordinating care for left renal angiomyolipoma, status post embolization.

## 2016-05-20 DIAGNOSIS — J309 Allergic rhinitis, unspecified: Secondary | ICD-10-CM | POA: Diagnosis not present

## 2016-05-20 DIAGNOSIS — J320 Chronic maxillary sinusitis: Secondary | ICD-10-CM | POA: Diagnosis not present

## 2016-05-20 DIAGNOSIS — E1122 Type 2 diabetes mellitus with diabetic chronic kidney disease: Secondary | ICD-10-CM | POA: Diagnosis not present

## 2016-05-23 DIAGNOSIS — E782 Mixed hyperlipidemia: Secondary | ICD-10-CM | POA: Diagnosis not present

## 2016-05-23 DIAGNOSIS — Z23 Encounter for immunization: Secondary | ICD-10-CM | POA: Diagnosis not present

## 2016-05-23 DIAGNOSIS — I129 Hypertensive chronic kidney disease with stage 1 through stage 4 chronic kidney disease, or unspecified chronic kidney disease: Secondary | ICD-10-CM | POA: Diagnosis not present

## 2016-05-23 DIAGNOSIS — N182 Chronic kidney disease, stage 2 (mild): Secondary | ICD-10-CM | POA: Diagnosis not present

## 2016-05-23 DIAGNOSIS — Z0000 Encounter for general adult medical examination without abnormal findings: Secondary | ICD-10-CM | POA: Diagnosis not present

## 2016-05-29 ENCOUNTER — Emergency Department (HOSPITAL_COMMUNITY)
Admission: EM | Admit: 2016-05-29 | Discharge: 2016-05-29 | Disposition: A | Discharge status: Home / Self Care | Payer: Medicare Other | Attending: Emergency Medicine | Admitting: Emergency Medicine

## 2016-05-29 ENCOUNTER — Encounter (HOSPITAL_COMMUNITY): Payer: Self-pay

## 2016-05-29 ENCOUNTER — Emergency Department (HOSPITAL_COMMUNITY): Payer: Medicare Other

## 2016-05-29 DIAGNOSIS — Y999 Unspecified external cause status: Secondary | ICD-10-CM | POA: Insufficient documentation

## 2016-05-29 DIAGNOSIS — Y92410 Unspecified street and highway as the place of occurrence of the external cause: Secondary | ICD-10-CM | POA: Diagnosis not present

## 2016-05-29 DIAGNOSIS — S39012A Strain of muscle, fascia and tendon of lower back, initial encounter: Secondary | ICD-10-CM | POA: Diagnosis not present

## 2016-05-29 DIAGNOSIS — Y939 Activity, unspecified: Secondary | ICD-10-CM | POA: Insufficient documentation

## 2016-05-29 DIAGNOSIS — E119 Type 2 diabetes mellitus without complications: Secondary | ICD-10-CM | POA: Diagnosis not present

## 2016-05-29 DIAGNOSIS — I10 Essential (primary) hypertension: Secondary | ICD-10-CM | POA: Diagnosis not present

## 2016-05-29 DIAGNOSIS — Z79899 Other long term (current) drug therapy: Secondary | ICD-10-CM | POA: Insufficient documentation

## 2016-05-29 DIAGNOSIS — R102 Pelvic and perineal pain: Secondary | ICD-10-CM | POA: Diagnosis not present

## 2016-05-29 DIAGNOSIS — M545 Low back pain: Secondary | ICD-10-CM | POA: Diagnosis not present

## 2016-05-29 DIAGNOSIS — S3992XA Unspecified injury of lower back, initial encounter: Secondary | ICD-10-CM | POA: Diagnosis not present

## 2016-05-29 DIAGNOSIS — S3993XA Unspecified injury of pelvis, initial encounter: Secondary | ICD-10-CM | POA: Diagnosis not present

## 2016-05-29 MED ORDER — METHOCARBAMOL 500 MG PO TABS: 500.0000 mg | ORAL_TABLET | Freq: Three times a day (TID) | ORAL | 0 refills | Status: DC | PRN

## 2016-05-29 MED ORDER — NAPROXEN 500 MG PO TABS: 500.0000 mg | ORAL_TABLET | Freq: Two times a day (BID) | ORAL | 0 refills | Status: DC
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
## 2016-05-29 NOTE — Discharge Instructions (Signed)
Rx for Naproxen (Antiiflammatory) and robaxin (muscle relaxant).  Ice to painful areas tonight.

## 2016-06-11 DIAGNOSIS — M810 Age-related osteoporosis without current pathological fracture: Secondary | ICD-10-CM | POA: Diagnosis not present

## 2016-06-27 ENCOUNTER — Encounter (HOSPITAL_COMMUNITY): Payer: Self-pay | Admitting: Emergency Medicine

## 2016-06-27 ENCOUNTER — Inpatient Hospital Stay (HOSPITAL_COMMUNITY)
Admission: EM | Admit: 2016-06-27 | Discharge: 2016-07-01 | DRG: 065 | Disposition: A | Discharge status: Rehabilitation Facility | Payer: Medicare Other | Attending: Neurology | Admitting: Neurology

## 2016-06-27 ENCOUNTER — Emergency Department (HOSPITAL_COMMUNITY): Payer: Medicare Other

## 2016-06-27 DIAGNOSIS — K5900 Constipation, unspecified: Secondary | ICD-10-CM | POA: Diagnosis not present

## 2016-06-27 DIAGNOSIS — I10 Essential (primary) hypertension: Secondary | ICD-10-CM | POA: Diagnosis present

## 2016-06-27 DIAGNOSIS — I129 Hypertensive chronic kidney disease with stage 1 through stage 4 chronic kidney disease, or unspecified chronic kidney disease: Secondary | ICD-10-CM | POA: Diagnosis not present

## 2016-06-27 DIAGNOSIS — Z7984 Long term (current) use of oral hypoglycemic drugs: Secondary | ICD-10-CM

## 2016-06-27 DIAGNOSIS — R29810 Facial weakness: Secondary | ICD-10-CM | POA: Diagnosis not present

## 2016-06-27 DIAGNOSIS — I161 Hypertensive emergency: Secondary | ICD-10-CM | POA: Diagnosis present

## 2016-06-27 DIAGNOSIS — E1122 Type 2 diabetes mellitus with diabetic chronic kidney disease: Secondary | ICD-10-CM

## 2016-06-27 DIAGNOSIS — N189 Chronic kidney disease, unspecified: Secondary | ICD-10-CM | POA: Diagnosis not present

## 2016-06-27 DIAGNOSIS — Z882 Allergy status to sulfonamides status: Secondary | ICD-10-CM

## 2016-06-27 DIAGNOSIS — Z79899 Other long term (current) drug therapy: Secondary | ICD-10-CM | POA: Diagnosis not present

## 2016-06-27 DIAGNOSIS — R040 Epistaxis: Secondary | ICD-10-CM | POA: Diagnosis not present

## 2016-06-27 DIAGNOSIS — I619 Nontraumatic intracerebral hemorrhage, unspecified: Secondary | ICD-10-CM | POA: Diagnosis present

## 2016-06-27 DIAGNOSIS — E782 Mixed hyperlipidemia: Secondary | ICD-10-CM | POA: Diagnosis not present

## 2016-06-27 DIAGNOSIS — Z880 Allergy status to penicillin: Secondary | ICD-10-CM

## 2016-06-27 DIAGNOSIS — E8809 Other disorders of plasma-protein metabolism, not elsewhere classified: Secondary | ICD-10-CM | POA: Diagnosis not present

## 2016-06-27 DIAGNOSIS — M1990 Unspecified osteoarthritis, unspecified site: Secondary | ICD-10-CM | POA: Diagnosis not present

## 2016-06-27 DIAGNOSIS — I610 Nontraumatic intracerebral hemorrhage in hemisphere, subcortical: Secondary | ICD-10-CM | POA: Diagnosis not present

## 2016-06-27 DIAGNOSIS — I618 Other nontraumatic intracerebral hemorrhage: Principal | ICD-10-CM | POA: Diagnosis present

## 2016-06-27 DIAGNOSIS — I611 Nontraumatic intracerebral hemorrhage in hemisphere, cortical: Secondary | ICD-10-CM | POA: Diagnosis not present

## 2016-06-27 DIAGNOSIS — S06360A Traumatic hemorrhage of cerebrum, unspecified, without loss of consciousness, initial encounter: Secondary | ICD-10-CM | POA: Diagnosis not present

## 2016-06-27 DIAGNOSIS — R269 Unspecified abnormalities of gait and mobility: Secondary | ICD-10-CM | POA: Diagnosis not present

## 2016-06-27 DIAGNOSIS — I639 Cerebral infarction, unspecified: Secondary | ICD-10-CM | POA: Diagnosis not present

## 2016-06-27 DIAGNOSIS — N183 Chronic kidney disease, stage 3 (moderate): Secondary | ICD-10-CM

## 2016-06-27 DIAGNOSIS — I69198 Other sequelae of nontraumatic intracerebral hemorrhage: Secondary | ICD-10-CM | POA: Diagnosis not present

## 2016-06-27 DIAGNOSIS — Z9114 Patient's other noncompliance with medication regimen: Secondary | ICD-10-CM | POA: Diagnosis not present

## 2016-06-27 DIAGNOSIS — E119 Type 2 diabetes mellitus without complications: Secondary | ICD-10-CM | POA: Diagnosis not present

## 2016-06-27 LAB — DIFFERENTIAL
BASOS ABS: 0 10*3/uL (ref 0.0–0.1)
Basophils Relative: 1 %
Eosinophils Absolute: 0.2 10*3/uL (ref 0.0–0.7)
Eosinophils Relative: 2 %
LYMPHS ABS: 2.9 10*3/uL (ref 0.7–4.0)
LYMPHS PCT: 34 %
Monocytes Absolute: 0.6 10*3/uL (ref 0.1–1.0)
Monocytes Relative: 7 %
NEUTROS ABS: 4.8 10*3/uL (ref 1.7–7.7)
NEUTROS PCT: 56 %

## 2016-06-27 LAB — PROTIME-INR
INR: 0.93
Prothrombin Time: 12.4 seconds (ref 11.4–15.2)

## 2016-06-27 LAB — CBC
HCT: 38.7 % (ref 36.0–46.0)
HEMOGLOBIN: 12.7 g/dL (ref 12.0–15.0)
MCH: 30.4 pg (ref 26.0–34.0)
MCHC: 32.8 g/dL (ref 30.0–36.0)
MCV: 92.6 fL (ref 78.0–100.0)
PLATELETS: 400 10*3/uL (ref 150–400)
RBC: 4.18 MIL/uL (ref 3.87–5.11)
RDW: 14.5 % (ref 11.5–15.5)
WBC: 8.4 10*3/uL (ref 4.0–10.5)

## 2016-06-27 LAB — COMPREHENSIVE METABOLIC PANEL
ALBUMIN: 4.4 g/dL (ref 3.5–5.0)
ALK PHOS: 73 U/L (ref 38–126)
ALT: 22 U/L (ref 14–54)
AST: 25 U/L (ref 15–41)
Anion gap: 9 (ref 5–15)
BILIRUBIN TOTAL: 0.7 mg/dL (ref 0.3–1.2)
BUN: 23 mg/dL — AB (ref 6–20)
CALCIUM: 9.2 mg/dL (ref 8.9–10.3)
CO2: 23 mmol/L (ref 22–32)
Chloride: 105 mmol/L (ref 101–111)
Creatinine, Ser: 1.07 mg/dL — ABNORMAL HIGH (ref 0.44–1.00)
GFR calc Af Amer: 56 mL/min — ABNORMAL LOW (ref >60)
GFR calc non Af Amer: 48 mL/min — ABNORMAL LOW (ref >60)
GLUCOSE: 113 mg/dL — AB (ref 65–99)
Potassium: 3.6 mmol/L (ref 3.5–5.1)
SODIUM: 137 mmol/L (ref 135–145)
TOTAL PROTEIN: 7.2 g/dL (ref 6.5–8.1)

## 2016-06-27 LAB — I-STAT CHEM 8, ED
BUN: 24 mg/dL — AB (ref 6–20)
CREATININE: 1.1 mg/dL — AB (ref 0.44–1.00)
Calcium, Ion: 1.16 mmol/L (ref 1.15–1.40)
Chloride: 103 mmol/L (ref 101–111)
GLUCOSE: 109 mg/dL — AB (ref 65–99)
HCT: 41 % (ref 36.0–46.0)
HEMOGLOBIN: 13.9 g/dL (ref 12.0–15.0)
POTASSIUM: 3.6 mmol/L (ref 3.5–5.1)
Sodium: 139 mmol/L (ref 135–145)
TCO2: 25 mmol/L (ref 0–100)

## 2016-06-27 LAB — CBG MONITORING, ED: Glucose-Capillary: 104 mg/dL — ABNORMAL HIGH (ref 65–99)

## 2016-06-27 LAB — I-STAT TROPONIN, ED: Troponin i, poc: 0.01 ng/mL (ref 0.00–0.08)

## 2016-06-27 LAB — APTT: APTT: 30 s (ref 24–36)

## 2016-06-27 MED ORDER — LABETALOL HCL 5 MG/ML IV SOLN
10.0000 mg | INTRAVENOUS | Status: DC | PRN
  Administered 2016-06-29: 10 mg via INTRAVENOUS
  Filled 2016-06-27: qty 4

## 2016-06-27 MED ORDER — ACETAMINOPHEN 650 MG RE SUPP: 650.0000 mg | RECTAL | Status: DC | PRN

## 2016-06-27 MED ORDER — CLEVIDIPINE BUTYRATE 0.5 MG/ML IV EMUL
0.0000 mg/h | INTRAVENOUS | Status: DC
  Administered 2016-06-27 (×2): 21 mg/h via INTRAVENOUS
  Administered 2016-06-27: 17 mg/h via INTRAVENOUS
  Administered 2016-06-28: 9 mg/h via INTRAVENOUS
  Administered 2016-06-28: 7 mg/h via INTRAVENOUS
  Administered 2016-06-28: 5 mg/h via INTRAVENOUS
  Filled 2016-06-27 (×7): qty 50

## 2016-06-27 MED ORDER — PANTOPRAZOLE SODIUM 40 MG IV SOLR
40.0000 mg | Freq: Every day | INTRAVENOUS | Status: DC
  Administered 2016-06-27 – 2016-06-28 (×2): 40 mg via INTRAVENOUS
  Filled 2016-06-27 (×2): qty 40

## 2016-06-27 MED ORDER — ACETAMINOPHEN 325 MG PO TABS
650.0000 mg | ORAL_TABLET | ORAL | Status: DC | PRN
  Administered 2016-06-27 – 2016-07-01 (×6): 650 mg via ORAL
  Filled 2016-06-27 (×6): qty 2

## 2016-06-27 MED ORDER — ONDANSETRON HCL 4 MG/2ML IJ SOLN
4.0000 mg | INTRAMUSCULAR | Status: DC | PRN
  Administered 2016-06-28 – 2016-06-30 (×2): 4 mg via INTRAVENOUS
  Filled 2016-06-27 (×2): qty 2

## 2016-06-27 MED ORDER — ONDANSETRON HCL 4 MG/2ML IJ SOLN
INTRAMUSCULAR | Status: AC
  Administered 2016-06-27: 4 mg via INTRAVENOUS
  Filled 2016-06-27: qty 2

## 2016-06-27 MED ORDER — CALCIUM CARBONATE ANTACID 500 MG PO CHEW
1.0000 | CHEWABLE_TABLET | Freq: Four times a day (QID) | ORAL | Status: DC | PRN
Start: 2016-06-27 — End: 2016-07-01
  Administered 2016-06-27 – 2016-06-28 (×2): 200 mg via ORAL
  Filled 2016-06-27 (×2): qty 1

## 2016-06-27 MED ORDER — CLEVIDIPINE BUTYRATE 0.5 MG/ML IV EMUL: 0.0000 mg/h | INTRAVENOUS | Status: DC

## 2016-06-27 MED ORDER — STROKE: EARLY STAGES OF RECOVERY BOOK
Freq: Once | Status: AC
  Administered 2016-06-27: 20:00:00
  Filled 2016-06-27: qty 1

## 2016-06-27 NOTE — ED Notes (Signed)
[facility] here to transfer pt to [facility]. 

## 2016-06-27 NOTE — Progress Notes (Signed)
Patient continues to complain of chest pain and reports she believes it is from straining while vomiting. Patient given tylenol for pain and EKG has been completed. Will continue to monitor patient.

## 2016-06-27 NOTE — Progress Notes (Signed)
Patient complaining of chest pain after vomiting episode. Dr. Leonel Ramsay notified and order placed for EKG.

## 2016-06-27 NOTE — H&P (Signed)
Neurology H&P  CC: headache  History is obtained from:patient  HPI: Anna Hoffman is a 81 y.o. female Who was in her normal state of health until around 4:30 PM. She states that she had sudden onset of dizziness this, unsteadiness, headache that started at this time. She continued to have these problems and presented to the [facility] long emergency room where CT scan was performed which demonstrates a thalamic hemorrhage.   She was hypertensive and therefore started on IV Cleviprex and then transferred to [facility] neuro ICU for further management.   She denies any recent trauma, denies taking any blood thinners.  LKW: 4:30 PM tpa given?: no, ICH ICH Score: 0   ROS: A 14 point ROS was performed and is negative except as noted in the HPI.   Past Medical History:  Diagnosis Date  . Diabetes mellitus without complication ([place])   . Hypertension   . Hypokalemia      Family History  Problem Relation Age of Onset  . Prostate cancer Father      Social History:  reports that she has never smoked. She has never used smokeless tobacco. She reports that she does not drink alcohol or use drugs.   Exam: Current vital signs: BP (!) 185/102 (BP Location: Left Arm)   Pulse 88   Temp 98.3 F (36.8 C) (Oral)   Resp 20   Ht 5\' 5"  (1.651 m)   Wt 53.1 kg (117 lb)   SpO2 99%   BMI 19.47 kg/m  Vital signs in last 24 hours: Temp:  [98.3 F (36.8 C)] 98.3 F (36.8 C) (11/24 1841) Pulse Rate:  [88] 88 (11/24 1841) Resp:  [20] 20 (11/24 1841) BP: (185)/(102) 185/102 (11/24 1841) SpO2:  [99 %] 99 % (11/24 1841) Weight:  [53.1 kg (117 lb)] 53.1 kg (117 lb) (11/24 1843)  Physical Exam  Constitutional: Appears well-developed and well-nourished.  Psych: Affect appropriate to situation Eyes: No scleral injection HENT: No OP obstrucion Head: Normocephalic.  Cardiovascular: Normal rate and regular rhythm.  Respiratory: Effort normal and breath sounds normal to anterior ascultation GI: Soft.   No distension. There is no tenderness.  Skin: WDI  Neuro: Mental Status: Patient is awake, alert, oriented to person, place, month, year, and situation. Patient is able to give a clear and coherent history. No signs of aphasia or neglect Cranial Nerves: II: Visual Fields are full. Pupils are equal, round, and reactive to light.   III,IV, VI: EOMI without ptosis or diploplia.  V: Facial sensation is symmetric to temperature VII: Facial movement is Notable for mild left facial weakness VIII: hearing is intact to voice X: Uvula elevates symmetrically XI: Shoulder shrug is symmetric. XII: tongue is midline without atrophy or fasciculations.  Motor: Tone is normal. Bulk is normal. 5/5 strength was present in all four extremities.  Sensory: Sensation is symmetric to light touch and temperature in the arms and legs. Cerebellar: FNF and HKS are intact bilaterally   I have reviewed labs in epic and the results pertinent to this consultation are: INR 0.9, borderline creatinine 1.07   I have reviewed the images obtained:CT head-small thalamic hemorrhage  Impression: 80 year old female with what is very likely a hypertensive right thalamus. She has responded to IV  cleviprex and will need close ICU monitoring.   Recommendations: 1) Admit to ICU 2) no antiplatelets or anticoagulants 3) blood pressure control with goal systolic <657 4) Frequent neuro checks 5) If symptoms worsen or there is decreased mental status,  repeat stat head CT 6) PT,OT,ST 7) repeat CT in the AM.    This patient is critically ill and at significant risk of neurological worsening, death and care requires constant monitoring of vital signs, hemodynamics,respiratory and cardiac monitoring, neurological assessment, discussion with family, other specialists and medical decision making of high complexity. I spent 45 minutes of neurocritical care time  in the care of  this patient.  Roland Rack, MD [facility] [phone]  If 7pm- 7am, please page neurology on call as listed in [place]. 06/27/2016  11:13 PM

## 2016-06-27 NOTE — ED Provider Notes (Signed)
[facility] DEPT Provider Note   CSN: [id number] Arrival date & time: 06/27/16  [place]     History   Chief Complaint Chief Complaint  Patient presents with  . Dizziness  . Gait Problem    HPI Anna Hoffman is a 81 y.o. female.  HPI Between 4:30 and 5 PM the patient had loss of balance and difficulty with gait. She had been out with friends earlier and had no difficulty whatsoever. At baseline her gait is normal. She reports she did get a headache in association with this. She considered a headache to be moderate and typical of frequent headaches that she experiences. She however never has neurologic symptoms or dysfunction in association with her headaches. She denies that she had any visual changes. She predominantly felt that her legs were weak and giving out under her when she tried to walk. She also felt lightheadedness\dizzy sensation. Patient denies any history of prior stroke. Past Medical History:  Diagnosis Date  . Diabetes mellitus without complication ([place])   . Hypertension   . Hypokalemia     Patient Active Problem List   Diagnosis Date Noted  . 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 Retroperitoneal bleed 02/08/2016  . Abdominal pain   . Protein-calorie malnutrition, severe 02/05/2016  . Gastroenteritis 02/04/2016  . Diarrhea 02/04/2016  . Hypokalemia 02/04/2016  . Incarcerated paraesophageal hernia 09/02/2014  . Renal mass, left 09/02/2014  . Acute esophagitis 09/02/2014  . Gastric outlet obstruction 09/02/2014  . Hypertension   . Vomiting 09/01/2014    Past Surgical History:  Procedure Laterality Date  . ABDOMINAL HYSTERECTOMY    . ESOPHAGOGASTRODUODENOSCOPY N/A 09/01/2014   Procedure: ESOPHAGOGASTRODUODENOSCOPY (EGD);  Surgeon: Lear Ng, MD;  Location: [facility];  Service: Endoscopy;  Laterality: N/A;  . HIATAL HERNIA REPAIR N/A 09/04/2014   Procedure: LAPAROSCOPIC REPAIR OF HIATAL HERNIA;  Surgeon: Excell Seltzer, MD;  Location: [facility]  ORS;  Service: General;  Laterality: N/A;  With MESH  . IR GENERIC HISTORICAL  04/10/2016   IR US GUIDE VASC ACCESS RIGHT 04/10/2016 Corrie Mckusick, DO [facility]  . IR GENERIC HISTORICAL  04/10/2016   IR ANGIOGRAM SELECTIVE EACH ADDITIONAL VESSEL 04/10/2016 Corrie Mckusick, DO [facility]  . IR GENERIC HISTORICAL  04/10/2016   IR ANGIOGRAM SELECTIVE EACH ADDITIONAL VESSEL 04/10/2016 Corrie Mckusick, DO [facility]  . IR GENERIC HISTORICAL  04/10/2016   IR EMBO TUMOR ORGAN ISCHEMIA INFARCT INC GUIDE ROADMAPPING 04/10/2016 Corrie Mckusick, DO [facility]  . IR GENERIC HISTORICAL  04/10/2016   IR ANGIOGRAM SELECTIVE EACH ADDITIONAL VESSEL 04/10/2016 Corrie Mckusick, DO [facility]  . IR GENERIC HISTORICAL  04/10/2016   IR ANGIOGRAM SELECTIVE EACH ADDITIONAL VESSEL 04/10/2016 Corrie Mckusick, DO [facility]  . IR GENERIC HISTORICAL  04/10/2016   IR RENAL SELECTIVE  UNI INC S&I MOD SED 04/10/2016 Corrie Mckusick, DO [facility]  . KNEE SURGERY      OB History    No data available       Home Medications    Prior to Admission medications   Medication Sig Start Date End Date Taking? Authorizing Provider  acetaminophen (TYLENOL) 325 MG tablet Take 2 tablets (650 mg total) by mouth every 6 (six) hours as needed for mild pain (or Fever >/= 101). 02/08/16   [place] Marisa Hua, MD  Ascorbic Acid (VITAMIN C) 1000 MG tablet Take 1,000 mg by mouth daily.    Historical Provider, MD  fexofenadine (ALLEGRA) 180 MG tablet Take 180 mg by mouth daily.  Historical Provider, MD  methocarbamol (ROBAXIN) 500 MG tablet Take 1 tablet (500 mg total) by mouth 3 (three) times daily between meals as needed. 05/29/16   Tanna Furry, MD  Multiple Vitamins-Minerals (STRESS TAB NF PO) Take 1 tablet by mouth daily.    Historical Provider, MD  naproxen (NAPROSYN) 500 MG tablet Take 1 tablet (500 mg total) by mouth 2 (two) times daily. 05/29/16   Tanna Furry, MD  Olmesartan-Amlodipine-HCTZ [facility]) 20-5-12.5 MG TABS Take 1 tablet by mouth daily.     Historical Provider, MD  rosuvastatin (CRESTOR) 10 MG tablet Take 10 mg by mouth daily.    Historical Provider, MD  vitamin B-12 (CYANOCOBALAMIN) 1000 MCG tablet Take 1,000 mcg by mouth daily.    Historical Provider, MD    Family History Family History  Problem Relation Age of Onset  . Prostate cancer Father     Social History Social History  Substance Use Topics  . Smoking status: Never Smoker  . Smokeless tobacco: Never Used  . Alcohol use No     Allergies   Amoxicillin; Ampicillin; and Sulfa antibiotics   Review of Systems Review of Systems 10 Systems reviewed and are negative for acute change except as noted in the HPI.   Physical Exam Updated Vital Signs BP (!) 185/102 (BP Location: Left Arm)   Pulse 88   Temp 98.3 F (36.8 C) (Oral)   Resp 20   Ht 5\' 5"  (1.651 m)   Wt 117 lb (53.1 kg)   SpO2 99%   BMI 19.47 kg/m   Physical Exam  Constitutional: She is oriented to person, place, and time. She appears well-developed and well-nourished. No distress.  HENT:  Head: Normocephalic and atraumatic.  Right Ear: External ear normal.  Left Ear: External ear normal.  Nose: Nose normal.  Eyes: EOM are normal. Pupils are equal, round, and reactive to light.  Neck: Neck supple.  Cardiovascular: Normal rate, regular rhythm, normal heart sounds and intact distal pulses.   Pulmonary/Chest: Effort normal and breath sounds normal.  Abdominal: Soft. She exhibits no distension. There is no tenderness.  Musculoskeletal: She exhibits no edema, tenderness or deformity.  Neurological: She is alert and oriented to person, place, and time. No cranial nerve deficit or sensory deficit. She exhibits normal muscle tone. Coordination abnormal.  Patient did not have evident facial droop. Cognitive function is normal. Speech is clear. No pronator drift. Grip strength bilaterally symmetric. No evident weakness with extension at the knee. However getting the patient and position to ambulate,  she had evident weakness in the left lower extremity and unsteady gait.  Skin: Skin is warm and dry.     ED Treatments / Results  Labs (all labs ordered are listed, but only abnormal results are displayed) Labs Reviewed  CBG MONITORING, ED - Abnormal; Notable for the following:       Result Value   Glucose-Capillary 104 (*)    All other components within normal limits  I-STAT CHEM 8, ED - Abnormal; Notable for the following:    BUN 24 (*)    Creatinine, Ser 1.10 (*)    Glucose, Bld 109 (*)    All other components within normal limits  PROTIME-INR  APTT  CBC  DIFFERENTIAL  COMPREHENSIVE METABOLIC PANEL  I-STAT TROPOININ, ED    EKG  EKG Interpretation None       Radiology Ct Head Wo Contrast  Result Date: 06/27/2016 CLINICAL DATA:  Pt reports bilateral leg weakness, unsteady gait, and dizziness onset 1630.  Code stroke. EXAM: CT HEAD WITHOUT CONTRAST TECHNIQUE: Contiguous axial images were obtained from the base of the skull through the vertex without intravenous contrast. COMPARISON:  CT head dated 12/06/2011. FINDINGS: Brain: Focus of acute hemorrhage within the right thalamus, measuring 1.7 x 1 cm, with mild surrounding edema. No significant mass effect, perhaps very mild mass-effect on the adjacent third ventricle. No midline shift or herniation. There are also patchy areas of low density throughout the bilateral periventricular and subcortical white matter regions consistent with chronic small vessel ischemic change. No hydrocephalus. Vascular: There are chronic calcified atherosclerotic changes of the large vessels at the skull base. No unexpected hyperdense vessel. Skull: Normal. Negative for fracture or focal lesion. Sinuses/Orbits: Small fluid level within the sphenoid sinus, of uncertain age. Remainder of the visualized upper paranasal sinuses are clear. Periorbital and retro-orbital soft tissues are unremarkable. Other: None. IMPRESSION: 1. Acute hemorrhage within the  right thalamus, measuring 1.7 x 1 cm, with mild surrounding edema. No significant mass effect. No other acute findings. 2. Chronic small vessel ischemic changes in the white matter. 3. Sphenoid sinus disease, of uncertain age. Critical Value/emergent results were called by telephone at the time of interpretation on 06/27/2016 at 6:57 pm to Dr. Johnney Killian, who verbally acknowledged these results. Electronically Signed   By: Franki Cabot M.D.   On: 06/27/2016 19:01    Procedures Procedures (including critical care time) CRITICAL CARE Performed by: Charlesetta Shanks   Total critical care time: 30 minutes  Critical care time was exclusive of separately billable procedures and treating other patients.  Critical care was necessary to treat or prevent imminent or life-threatening deterioration.  Critical care was time spent personally by me on the following activities: development of treatment plan with patient and/or surrogate as well as nursing, discussions with consultants, evaluation of patient's response to treatment, examination of patient, obtaining history from patient or surrogate, ordering and performing treatments and interventions, ordering and review of laboratory studies, ordering and review of radiographic studies, pulse oximetry and re-evaluation of patient's condition. Medications Ordered in ED Medications - No data to display   Initial Impression / Assessment and Plan / ED Course  I have reviewed the triage vital signs and the nursing notes.  Pertinent labs & imaging results that were available during my care of the patient were reviewed by me and considered in my medical decision making (see chart for details).  Clinical Course    Consult: Reviewed with Dr. Lawana Pai. Patient will be transferred as code stroke with acute onset of focal neurologic deficit. Consult: Reviewed with Dr. Leonel Ramsay. Will admit to neuro ICU.  Final Clinical Impressions(s) / ED Diagnoses   Final  diagnoses:  Thalamic hemorrhage with stroke [facility])    New Prescriptions New Prescriptions   No medications on file     Charlesetta Shanks, MD 06/27/16 1919

## 2016-06-27 NOTE — Progress Notes (Signed)
Patient vomiting and reports she had indigestion prior to vomiting. Dr. Leonel Ramsay notified, order for zofran placed.

## 2016-06-27 NOTE — ED Notes (Signed)
Bed: FB51 Expected date:  Expected time:  Means of arrival:  Comments: Tr 1

## 2016-06-27 NOTE — ED Notes (Signed)
Pt transported to CT ?

## 2016-06-27 NOTE — ED Triage Notes (Addendum)
Pt reports bilateral leg weakness, unsteady gait, and dizziness onset 1630. Pfeiffer in room with triage. With neurological exam pt moderate strength throughout with testing grips but drifts to the left with ambulation. Pt denies numbness, visual loss. Pt a/o x4.

## 2016-06-28 LAB — GLUCOSE, CAPILLARY
GLUCOSE-CAPILLARY: 111 mg/dL — AB (ref 65–99)
Glucose-Capillary: 139 mg/dL — ABNORMAL HIGH (ref 65–99)

## 2016-06-28 LAB — MRSA PCR SCREENING: MRSA by PCR: NEGATIVE

## 2016-06-28 MED ORDER — LORATADINE 10 MG PO TABS
10.0000 mg | ORAL_TABLET | Freq: Every day | ORAL | Status: DC
  Administered 2016-06-28 – 2016-07-01 (×4): 10 mg via ORAL
  Filled 2016-06-28 (×4): qty 1

## 2016-06-28 MED ORDER — IRBESARTAN 150 MG PO TABS
150.0000 mg | ORAL_TABLET | Freq: Every day | ORAL | Status: DC
  Administered 2016-06-28 – 2016-07-01 (×4): 150 mg via ORAL
  Filled 2016-06-28 (×4): qty 1

## 2016-06-28 MED ORDER — AMLODIPINE BESYLATE 5 MG PO TABS
5.0000 mg | ORAL_TABLET | Freq: Every day | ORAL | Status: DC
  Administered 2016-06-28 – 2016-07-01 (×4): 5 mg via ORAL
  Filled 2016-06-28 (×4): qty 1

## 2016-06-28 NOTE — Progress Notes (Signed)
Inpatient Rehabilitation  Per PT request patient was screened by Gunnar Fusi for appropriateness for an Inpatient Acute Rehab consult.  At this time we are recommending an Inpatient Rehab consult.  Please order if you are agreeable.    Carmelia Roller., CCC/SLP Admission Coordinator  [place]  Cell [phone]

## 2016-06-28 NOTE — Progress Notes (Signed)
STROKE TEAM PROGRESS NOTE   HISTORY OF PRESENT ILLNESS (per record) Anna Hoffman is a 81 y.o. female who was in her normal state of health until around 4:30 PM. She states that she had sudden onset of dizziness this, unsteadiness, headache that started at this time. She continued to have these problems and presented to the [facility] long emergency room where CT scan was performed which demonstrates a thalamic hemorrhage.   She was hypertensive and therefore started on IV Cleviprex and then transferred to [facility] neuro ICU for further management. The patient had run out of her home blood pressure medications.  She denies any recent trauma, denies taking any blood thinners.  LKW: 4:30 PM tpa given?: no, ICH ICH Score: 0   SUBJECTIVE (INTERVAL HISTORY) Patient reports she has not been taking her blood pressure medications because she ran out for several days. Explained the need for compliance, will restart her oral medications, need to wean of cleviprex and transfer out of the floor.    OBJECTIVE Temp:  [98 F (36.7 C)-98.7 F (37.1 C)] 98 F (36.7 C) (11/25 0400) Pulse Rate:  [76-143] 88 (11/25 0700) Cardiac Rhythm: Normal sinus rhythm (11/25 0400) Resp:  [13-36] 15 (11/25 0700) BP: (124-185)/(62-121) 130/77 (11/25 0700) SpO2:  [90 %-100 %] 93 % (11/25 0700) Weight:  [53.1 kg (117 lb)] 53.1 kg (117 lb) (11/24 1843)  CBC:  Recent Labs Lab 06/27/16 1853 06/27/16 1907  WBC 8.4  --   NEUTROABS 4.8  --   HGB 12.7 13.9  HCT 38.7 41.0  MCV 92.6  --   PLT 400  --     Basic Metabolic Panel:  Recent Labs Lab 06/27/16 1853 06/27/16 1907  NA 137 139  K 3.6 3.6  CL 105 103  CO2 23  --   GLUCOSE 113* 109*  BUN 23* 24*  CREATININE 1.07* 1.10*  CALCIUM 9.2  --     Lipid Panel: No results found for: CHOL, TRIG, HDL, CHOLHDL, VLDL, LDLCALC HgbA1c:  Lab Results  Component Value Date   HGBA1C 6.1 (H) 02/04/2016   Urine Drug Screen: No results found for: LABOPIA,  COCAINSCRNUR, LABBENZ, AMPHETMU, THCU, LABBARB    IMAGING  Ct Head Wo Contrast 06/27/2016 1. Acute hemorrhage within the right thalamus, measuring 1.7 x 1 cm, with mild surrounding edema.   No significant mass effect. No other acute findings.  2. Chronic small vessel ischemic changes in the white matter.  3. Sphenoid sinus disease, of uncertain age.     PHYSICAL EXAM Constitutional: Appears well-developed and well-nourished.  Psych: Affect appropriate to situation Eyes: No scleral injection HENT: No OP obstrucion Head: Normocephalic.  Cardiovascular: Normal rate and regular rhythm.  Respiratory: Effort normal and breath sounds normal to anterior ascultation GI: Soft.  No distension. There is no tenderness.  Skin: WDI  Neuro: Mental Status: Patient is awake, alert, oriented to person, place, month, year, and situation. Patient is able to give a clear and coherent history. No signs of aphasia or neglect Cranial Nerves: II: Visual Fields are full. Pupils are equal, round, and reactive to light.   III,IV, VI: EOMI without ptosis or diploplia.  V: Facial sensation is symmetric to temperature VII: Facial movement is Notable for mild left facial weakness VIII: hearing is intact to voice X: Uvula elevates symmetrically XI: Shoulder shrug is symmetric. XII: tongue is midline without atrophy or fasciculations.  Motor: Tone is normal. Bulk is normal. 5/5 strength was present in all four extremities.  Sensory: Sensation is symmetric to light touch and temperature in the arms and legs. Cerebellar: FNF and HKS are intact bilaterally     ASSESSMENT/PLAN Anna Hoffman is a 81 y.o. female with history of hypertension, hyperlipidemia, diabetes mellitus, left renal artery  embolization of angiomyolipoma, and hypokalemia presenting with elevated blood pressure, dizziness, unsteadiness, and headache.  She did not receive IV t-PA due to [place].  Right thalamic hemorrhage:  Non-dominant  - believed secondary to hypertension.  Resultant  No deficits  MRI - not performed  MRA - not performed  Head CT -  Acute hemorrhage within the right thalamus due to HTN s/p medication noncompliance  Carotid Doppler - not indicated  2D Echo - 02/06/2016 - EF 65-70%.  LDL - pending  HgbA1c - pending  VTE prophylaxis - SCDs  Diet regular Room service appropriate? Yes; Fluid consistency: Thin  No antithrombotic prior to admission, now on No antithrombotic secondary to thalamic hemorrhage.  Ongoing aggressive stroke risk factor management  Therapy recommendations: pending  Disposition: Pending  Hypertension  Stable - on Cleviprex drip. IV Labetalol - prn  Permissive hypertension (OK if < 220/120) but gradually normalize in 5-7 days  Long-term BP goal normotensive  Hyperlipidemia  Home meds:  Crestor 10 mg daily  - not resumed in hospital secondary to hemorrhage.  LDL pending, goal < 70  Resume statin at discharge    Other Stroke Risk Factors  Advanced age    Other Active Problems  Renal insufficiency - BUN 24 ; creatinine 1.10   PLAN  Resume home blood pressure medications (hold HCTZ for now secondary to renal insufficiency)  Check labs in a.m.  Hospital day # 1   Personally examined patient and images, and have participated in and made any corrections needed to history, physical, neuro exam,assessment and plan as stated above.  I have personally obtained the history, evaluated lab date, reviewed imaging studies and agree with radiology interpretations.    Anna Ill, MD Stroke Neurology  A total of 35 minutes was spent face-to-face with this patient. Over half this time was spent on counseling patient on the hypertensive bleed diagnosis and different diagnostic and therapeutic options available.       To contact Stroke Continuity provider, please refer to [email or web address] After hours, contact General Neurology
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
## 2016-06-29 ENCOUNTER — Inpatient Hospital Stay (HOSPITAL_COMMUNITY): Payer: Medicare Other

## 2016-06-29 DIAGNOSIS — I619 Nontraumatic intracerebral hemorrhage, unspecified: Secondary | ICD-10-CM

## 2016-06-29 DIAGNOSIS — I611 Nontraumatic intracerebral hemorrhage in hemisphere, cortical: Secondary | ICD-10-CM

## 2016-06-29 DIAGNOSIS — E119 Type 2 diabetes mellitus without complications: Secondary | ICD-10-CM

## 2016-06-29 DIAGNOSIS — E782 Mixed hyperlipidemia: Secondary | ICD-10-CM

## 2016-06-29 DIAGNOSIS — I10 Essential (primary) hypertension: Secondary | ICD-10-CM

## 2016-06-29 LAB — GLUCOSE, CAPILLARY
GLUCOSE-CAPILLARY: 131 mg/dL — AB (ref 65–99)
GLUCOSE-CAPILLARY: 106 mg/dL — AB (ref 65–99)
GLUCOSE-CAPILLARY: 124 mg/dL — AB (ref 65–99)
Glucose-Capillary: 147 mg/dL — ABNORMAL HIGH (ref 65–99)

## 2016-06-29 LAB — LIPID PANEL
CHOL/HDL RATIO: 3.5 ratio
CHOLESTEROL: 224 mg/dL — AB (ref 0–200)
HDL: 64 mg/dL (ref >40)
LDL Cholesterol: 124 mg/dL — ABNORMAL HIGH (ref 0–99)
Triglycerides: 180 mg/dL — ABNORMAL HIGH (ref <150)
VLDL: 36 mg/dL (ref 0–40)

## 2016-06-29 MED ORDER — PANTOPRAZOLE SODIUM 40 MG PO TBEC
40.0000 mg | DELAYED_RELEASE_TABLET | Freq: Every day | ORAL | Status: DC
  Administered 2016-06-29 – 2016-06-30 (×2): 40 mg via ORAL
  Filled 2016-06-29 (×2): qty 1

## 2016-06-29 NOTE — Progress Notes (Signed)
STROKE TEAM PROGRESS NOTE   HISTORY OF PRESENT ILLNESS (per record) Anna Hoffman is a 81 y.o. female who was in her normal state of health until around 4:30 PM. She states that she had sudden onset of dizziness this, unsteadiness, headache that started at this time. She continued to have these problems and presented to the [facility] long emergency room where CT scan was performed which demonstrates a thalamic hemorrhage.   She was hypertensive and therefore started on IV Cleviprex and then transferred to [facility] neuro ICU for further management. The patient had run out of her home blood pressure medications.  She denies any recent trauma, denies taking any blood thinners.  LKW: 4:30 PM tpa given?: no, ICH ICH Score: 0   SUBJECTIVE (INTERVAL HISTORY) Patient reports she has not been taking her blood pressure medications because she ran out for several days. Explained the need for compliance, will restart her oral medications, weaned of cleviprex and transfered out of the ICU. Headache today, repeat CT head, pending discharge per PT recs.   OBJECTIVE Temp:  [97.5 F (36.4 C)-98.1 F (36.7 C)] 97.5 F (36.4 C) (11/26 0800) Pulse Rate:  [76-119] 77 (11/26 0700) Cardiac Rhythm: Sinus tachycardia (11/25 1640) Resp:  [12-27] 15 (11/26 0700) BP: (112-181)/(63-117) 135/75 (11/26 0700) SpO2:  [91 %-98 %] 93 % (11/26 0700)  CBC:   Recent Labs Lab 06/27/16 1853 06/27/16 1907  WBC 8.4  --   NEUTROABS 4.8  --   HGB 12.7 13.9  HCT 38.7 41.0  MCV 92.6  --   PLT 400  --     Basic Metabolic Panel:   Recent Labs Lab 06/27/16 1853 06/27/16 1907  NA 137 139  K 3.6 3.6  CL 105 103  CO2 23  --   GLUCOSE 113* 109*  BUN 23* 24*  CREATININE 1.07* 1.10*  CALCIUM 9.2  --     Lipid Panel:     Component Value Date/Time   CHOL 224 (H) 06/29/2016 0330   TRIG 180 (H) 06/29/2016 0330   HDL 64 06/29/2016 0330   CHOLHDL 3.5 06/29/2016 0330   VLDL 36 06/29/2016 0330   LDLCALC 124 (H)  06/29/2016 0330   HgbA1c:  Lab Results  Component Value Date   HGBA1C 6.1 (H) 02/04/2016   Urine Drug Screen: No results found for: LABOPIA, COCAINSCRNUR, LABBENZ, AMPHETMU, THCU, LABBARB    IMAGING  Ct Head Wo Contrast 06/27/2016 1. Acute hemorrhage within the right thalamus, measuring 1.7 x 1 cm, with mild surrounding edema.   No significant mass effect. No other acute findings.  2. Chronic small vessel ischemic changes in the white matter.  3. Sphenoid sinus disease, of uncertain age.    Ct Head Wo Contrast 06/29/2016 Pending    PHYSICAL EXAM Constitutional: Appears well-developed and well-nourished.  Psych: Affect appropriate to situation Eyes: No scleral injection HENT: No OP obstrucion Head: Normocephalic.  Cardiovascular: Normal rate and regular rhythm.  Respiratory: Effort normal and breath sounds normal to anterior ascultation GI: Soft.  No distension. There is no tenderness.  Skin: WDI  Neuro: Mental Status: Patient is awake, alert, oriented to person, place, month, year, and situation. Patient is able to give a clear and coherent history. No signs of aphasia or neglect Cranial Nerves: II: Visual Fields are full. Pupils are equal, round, and reactive to light.   III,IV, VI: EOMI without ptosis or diploplia.  V: Facial sensation is symmetric to temperature VII: Facial movement is Notable for mild left  facial weakness VIII: hearing is intact to voice X: Uvula elevates symmetrically XI: Shoulder shrug is symmetric. XII: tongue is midline without atrophy or fasciculations.  Motor: Tone is normal. Bulk is normal. 5/5 strength was present in all four extremities.  Sensory: Sensation is symmetric to light touch and temperature in the arms and legs. Cerebellar: FNF and HKS are intact bilaterally     ASSESSMENT/PLAN [name] is a 81 y.o. female with history of hypertension, hyperlipidemia, diabetes mellitus, left renal artery  embolization of  angiomyolipoma, and hypokalemia presenting with elevated blood pressure, dizziness, unsteadiness, and headache.  She did not receive IV t-PA due to [place].  Right thalamic hemorrhage:  Non-dominant - believed secondary to hypertension.  Resultant  No deficits  MRI - not performed  MRA - not performed  Head CT -  Acute hemorrhage within the right thalamus due to HTN s/p medication noncompliance. Repeat today due to headache.  Carotid Doppler - not indicated  2D Echo - 02/06/2016 - EF 65-70%.  LDL - 124  HgbA1c - pending  VTE prophylaxis - SCDs Diet regular Room service appropriate? Yes; Fluid consistency: Thin  No antithrombotic prior to admission, now on No antithrombotic secondary to thalamic hemorrhage.  Ongoing aggressive stroke risk factor management  Therapy recommendations: CIR recommended -> Rehab MD consult ordered  Disposition: Pending  Hypertension  Stable - on oral HTN meds, cleviprex weanes  Hypertension gpal < 160  Long-term BP goal normotensive  Hyperlipidemia  Home meds:  Crestor 10 mg daily  - not resumed in hospital secondary to hemorrhage. Resume on discharge.  LDL 124, goal < 70  Confirm compliance - Consider increase dose to 20 mg daily  Resume statin at discharge    Other Stroke Risk Factors  Advanced age    Other Active Problems  Renal insufficiency - BUN 24 ; creatinine 1.10   PLAN  Resumed home blood pressure medications (hold HCTZ for now secondary to renal insufficiency)  Check labs in a.m. (Bmet)  Possible inpatient rehab.  Repeat Head CT today  Hospital day # 2   Personally examined patient and images, and have participated in and made any corrections needed to history, physical, neuro exam,assessment and plan as stated above.  I have personally obtained the history, evaluated lab date, reviewed imaging studies and agree with radiology interpretations. She is doing well.  Acute hemorrhage within the right thalamus due to  HTN s/p medication noncompliance. Repeat today due to headache.   Sarina Ill, MD Stroke Neurology  A total of 35 minutes was spent face-to-face with this patient. Over half this time was spent on counseling patient on the hypertensive bleed diagnosis and counseling on compliance and discharge different diagnostic and therapeutic options available.       To contact Stroke Continuity provider, please refer to [email or web address] After hours, contact General Neurology
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
## 2016-06-30 DIAGNOSIS — I10 Essential (primary) hypertension: Secondary | ICD-10-CM

## 2016-06-30 DIAGNOSIS — E782 Mixed hyperlipidemia: Secondary | ICD-10-CM

## 2016-06-30 DIAGNOSIS — N183 Chronic kidney disease, stage 3 unspecified: Secondary | ICD-10-CM

## 2016-06-30 DIAGNOSIS — E1122 Type 2 diabetes mellitus with diabetic chronic kidney disease: Secondary | ICD-10-CM

## 2016-06-30 DIAGNOSIS — I610 Nontraumatic intracerebral hemorrhage in hemisphere, subcortical: Secondary | ICD-10-CM

## 2016-06-30 DIAGNOSIS — I619 Nontraumatic intracerebral hemorrhage, unspecified: Secondary | ICD-10-CM

## 2016-06-30 LAB — HEMOGLOBIN A1C
HEMOGLOBIN A1C: 6 % — AB (ref 4.8–5.6)
MEAN PLASMA GLUCOSE: 126 mg/dL

## 2016-06-30 LAB — GLUCOSE, CAPILLARY
GLUCOSE-CAPILLARY: 93 mg/dL (ref 65–99)
GLUCOSE-CAPILLARY: 122 mg/dL — AB (ref 65–99)
Glucose-Capillary: 89 mg/dL (ref 65–99)
Glucose-Capillary: 86 mg/dL (ref 65–99)

## 2016-06-30 LAB — BASIC METABOLIC PANEL
ANION GAP: 7 (ref 5–15)
BUN: 21 mg/dL — ABNORMAL HIGH (ref 6–20)
CALCIUM: 9 mg/dL (ref 8.9–10.3)
CO2: 25 mmol/L (ref 22–32)
CREATININE: 1 mg/dL (ref 0.44–1.00)
Chloride: 106 mmol/L (ref 101–111)
GFR, EST NON AFRICAN AMERICAN: 52 mL/min — AB (ref >60)
Glucose, Bld: 100 mg/dL — ABNORMAL HIGH (ref 65–99)
Potassium: 4.1 mmol/L (ref 3.5–5.1)
SODIUM: 138 mmol/L (ref 135–145)

## 2016-06-30 MED ORDER — BISACODYL 10 MG RE SUPP: 10.0000 mg | Freq: Every day | RECTAL | Status: DC | PRN

## 2016-06-30 NOTE — Progress Notes (Signed)
STROKE TEAM PROGRESS NOTE   SUBJECTIVE (INTERVAL HISTORY) Patient is awaiting insurance approval for rehabilitation transfer. She is medically stable for transfer when bed available.   OBJECTIVE Temp:  [97.3 F (36.3 C)-98.2 F (36.8 C)] 97.5 F (36.4 C) (11/27 0606) Pulse Rate:  [73-93] 83 (11/27 0606) Cardiac Rhythm: Heart block (11/27 0700) Resp:  [13-24] 20 (11/27 0606) BP: (130-153)/(76-124) 153/76 (11/27 0606) SpO2:  [94 %-99 %] 98 % (11/27 0606)  CBC:   Recent Labs Lab 06/27/16 1853 06/27/16 1907  WBC 8.4  --   NEUTROABS 4.8  --   HGB 12.7 13.9  HCT 38.7 41.0  MCV 92.6  --   PLT 400  --     Basic Metabolic Panel:   Recent Labs Lab 06/27/16 1853 06/27/16 1907 06/30/16 0706  NA 137 139 138  K 3.6 3.6 4.1  CL 105 103 106  CO2 23  --  25  GLUCOSE 113* 109* 100*  BUN 23* 24* 21*  CREATININE 1.07* 1.10* 1.00  CALCIUM 9.2  --  9.0    Lipid Panel:     Component Value Date/Time   CHOL 224 (H) 06/29/2016 0330   TRIG 180 (H) 06/29/2016 0330   HDL 64 06/29/2016 0330   CHOLHDL 3.5 06/29/2016 0330   VLDL 36 06/29/2016 0330   LDLCALC 124 (H) 06/29/2016 0330   HgbA1c:  Lab Results  Component Value Date   HGBA1C 6.1 (H) 02/04/2016   Urine Drug Screen: No results found for: LABOPIA, COCAINSCRNUR, LABBENZ, AMPHETMU, THCU, LABBARB    IMAGING  Ct Head Wo Contrast 06/27/2016 1. Acute hemorrhage within the right thalamus, measuring 1.7 x 1 cm, with mild surrounding edema.   No significant mass effect. No other acute findings.  2. Chronic small vessel ischemic changes in the white matter.  3. Sphenoid sinus disease, of uncertain age.   Ct Head Wo Contrast 06/29/2016 1. Stable small volume intra-axial hemorrhage at the caudal right thalamus/right cerebral peduncle. Mild surrounding edema with no significant regional mass effect. 2. No new intracranial abnormality. Underlying chronic small vessel disease. 3. Acute on chronic sphenoid sinusitis.      PHYSICAL EXAM Constitutional: Appears well-developed and well-nourished.  Psych: Affect appropriate to situation Eyes: No scleral injection HENT: No OP obstrucion Head: Normocephalic.  Cardiovascular: Normal rate and regular rhythm.  Respiratory: Effort normal and breath sounds normal to anterior ascultation GI: Soft.  No distension. There is no tenderness.  Skin: WDI Neuro: Mental Status: Patient is awake, alert, oriented to person, place, month, year, and situation. Patient is able to give a clear and coherent history. No signs of aphasia or neglect Cranial Nerves: II: Visual Fields are full. Pupils are equal, round, and reactive to light.   III,IV, VI: EOMI without ptosis or diploplia.  V: Facial sensation is symmetric to temperature VII: Facial movement is Notable for mild left facial weakness VIII: hearing is intact to voice X: Uvula elevates symmetrically XI: Shoulder shrug is symmetric. XII: tongue is midline without atrophy or fasciculations.  Motor: Tone is normal. Bulk is normal. 5/5 strength was present in all four extremities.  Sensory: Sensation is symmetric to light touch and temperature in the arms and legs. Cerebellar: FNF and HKS are intact bilaterally   ASSESSMENT/PLAN Ms. ALIANNA WURSTER is a 81 y.o. female with history of hypertension, hyperlipidemia, diabetes mellitus, left renal artery  embolization of angiomyolipoma, and hypokalemia presenting with elevated blood pressure, dizziness, unsteadiness, and headache.  She did not receive IV t-PA due  to [place].  Right thalamic hemorrhage:  Non-dominant - believed secondary to hypertension.  Resultant  No deficits  MRI - not performed  MRA - not performed  Head CT -  Acute hemorrhage within the right thalamus due to HTN s/p medication noncompliance. Repeat today due to headache.  Carotid Doppler - not indicated  2D Echo - 02/06/2016 - EF 65-70%.  LDL - 124  HgbA1c - 6.0  VTE prophylaxis - SCDs Diet  regular Room service appropriate? Yes; Fluid consistency: Thin  No antithrombotic prior to admission, now on No antithrombotic secondary to thalamic hemorrhage.  Ongoing aggressive stroke risk factor management  Therapy recommendations: CIR -> Rehab MD consult ordered  Disposition: Pending  Hypertension  Stable - on oral HTN meds, cleviprex weanes  Hypertension gpal < 160  Long-term BP goal normotensive Resumed home blood pressure medications (hold HCTZ for now secondary to renal insufficiency)  Hyperlipidemia  Home meds:  Crestor 10 mg daily  - not resumed in hospital secondary to hemorrhage. Resume on discharge.  LDL 124, goal < 70  Confirm compliance - Consider increase dose to 20 mg daily  Resume statin at discharge  Other Stroke Risk Factors  Advanced age  Other Active Problems  Renal insufficiency - BUN 24 ; creatinine 1.10  PLAN  Check labs in a.m. (Bmet)  Hospital day # 3  I have personally examined this patient, reviewed notes, independently viewed imaging studies, participated in medical decision making and plan of care.ROS completed by me personally and pertinent positives fully documented  I have made any additions or clarifications directly to the above note. Patient is medically stable for transfer to inpatient rehabilitation when bed available.  Antony Contras, MD Medical Director [facility] Pager: [phone] 06/30/2016 3:00 PM   To contact Stroke Continuity provider, please refer to [email or web address] After hours, contact General Neurology

## 2016-06-30 NOTE — Consult Note (Signed)
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 right handed female with history of hypertension, diabetes mellitus. Per chart review patient lives with son. 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 Son works during the day. Presented 06/27/2016 with sudden onset of dizziness, headache and unsteady gait. Blood pressure 185/102. CT of the head showed acute hemorrhage within the right thalamus measuring 1.7 x 1 cm with surrounding edema. No significant mass effect. Chronic small vessel ischemic changes in the white matter. Placed on intravenous cleviprex. Neurology follow-up conservative care provided monitoring of blood pressure. Follow-up cranial CT scans stable with no new intracranial abnormality as compared to prior study. Patient did not receive TPA due to [place]. Tolerating a regular diet. Creatinine on admission 1.07 and monitored. Physical therapy evaluation completed with recommendations of physical medicine rehabilitation consult.   Review of Systems  Constitutional: Negative for chills and fever.  HENT: Negative for hearing loss and tinnitus.   Eyes: Negative for blurred vision, double vision and discharge.  Respiratory: Negative for cough and shortness of breath.   Cardiovascular: Negative for chest pain, palpitations and leg swelling.  Gastrointestinal: Positive for constipation. Negative for nausea and vomiting.       GERD  Genitourinary: Negative for dysuria and hematuria.  Musculoskeletal: Positive for joint pain and myalgias.  Skin: Negative for rash.  Neurological: Positive for dizziness, weakness and headaches. Negative for seizures.  All other systems reviewed and are negative.  Past Medical History:  Diagnosis Date  . Diabetes mellitus without complication ([place])     . Hypertension   . Hypokalemia    Past Surgical History:  Procedure Laterality Date  . ABDOMINAL HYSTERECTOMY    . ESOPHAGOGASTRODUODENOSCOPY N/A 09/01/2014   Procedure: ESOPHAGOGASTRODUODENOSCOPY (EGD);  Surgeon: Lear Ng, MD;  Location: [facility];  Service: Endoscopy;  Laterality: N/A;  . HIATAL HERNIA REPAIR N/A 09/04/2014   Procedure: LAPAROSCOPIC REPAIR OF HIATAL HERNIA;  Surgeon: Excell Seltzer, MD;  Location: [facility];  Service: General;  Laterality: N/A;  With MESH  . IR GENERIC HISTORICAL  04/10/2016   IR US GUIDE VASC ACCESS RIGHT 04/10/2016 Corrie Mckusick, DO [facility]  . IR GENERIC HISTORICAL  04/10/2016   IR ANGIOGRAM SELECTIVE EACH ADDITIONAL VESSEL 04/10/2016 Corrie Mckusick, DO [facility]  . IR GENERIC HISTORICAL  04/10/2016   IR ANGIOGRAM SELECTIVE EACH ADDITIONAL VESSEL 04/10/2016 Corrie Mckusick, DO [facility]  . IR GENERIC HISTORICAL  04/10/2016   IR EMBO TUMOR ORGAN ISCHEMIA INFARCT INC GUIDE ROADMAPPING 04/10/2016 Corrie Mckusick, DO [facility]  . IR GENERIC HISTORICAL  04/10/2016   IR ANGIOGRAM SELECTIVE EACH ADDITIONAL VESSEL 04/10/2016 Corrie Mckusick, DO [facility]  . IR GENERIC HISTORICAL  04/10/2016   IR ANGIOGRAM SELECTIVE EACH ADDITIONAL VESSEL 04/10/2016 Corrie Mckusick, DO [facility]  . IR GENERIC HISTORICAL  04/10/2016   IR RENAL SELECTIVE  UNI INC S&I MOD SED 04/10/2016 Corrie Mckusick, DO [facility]  . KNEE SURGERY     Family History  Problem Relation Age of Onset  . Prostate cancer Father    Social History:  reports that she has never smoked. She has never used smokeless tobacco. She reports that she does not drink alcohol or use drugs. Allergies:  Allergies  Allergen Reactions  . Amoxicillin Diarrhea    Has patient  had a PCN reaction causing immediate rash, facial/tongue/throat swelling, SOB or lightheadedness with hypotension: no Has patient had a PCN reaction causing severe rash involving mucus membranes or skin necrosis: no Has patient had a PCN  reaction that required hospitalization: no pharmacist consult Has patient had a PCN reaction occurring within the last 10 years: yes If all of the above answers are "NO", then may proceed with Cephalosporin use.   . Ampicillin Rash    Has patient had a PCN reaction causing immediate rash, facial/tongue/throat swelling, SOB or lightheadedness with hypotension: No Has patient had a PCN reaction causing severe rash involving mucus membranes or skin necrosis: No Has patient had a PCN reaction that required hospitalization No Has patient had a PCN reaction occurring within the last 10 years: No If all of the above answers are "NO", then may proceed with Cephalosporin use.   . Sulfa Antibiotics Rash   Medications Prior to Admission  Medication Sig Dispense Refill  . acetaminophen (TYLENOL) 325 MG tablet Take 2 tablets (650 mg total) by mouth every 6 (six) hours as needed for mild pain (or Fever >/= 101).    . Ascorbic Acid (VITAMIN C) 1000 MG tablet Take 1,000 mg by mouth daily.    . fexofenadine (ALLEGRA) 180 MG tablet Take 180 mg by mouth daily.    . Multiple Vitamins-Minerals (STRESS TAB NF PO) Take 1 tablet by mouth daily.    . NON FORMULARY Take 1 tablet by mouth daily. Magnesium and D3    . Olmesartan-Amlodipine-HCTZ (TRIBENZOR) 20-5-12.5 MG TABS Take 1 tablet by mouth daily.    Marland Kitchen omeprazole (PRILOSEC) 40 MG capsule Take 40 mg by mouth daily.    . rosuvastatin (CRESTOR) 10 MG tablet Take 10 mg by mouth daily.    . vitamin B-12 (CYANOCOBALAMIN) 1000 MCG tablet Take 1,000 mcg by mouth daily.    . methocarbamol (ROBAXIN) 500 MG tablet Take 1 tablet (500 mg total) by mouth 3 (three) times daily between meals as needed. (Patient not taking: Reported on 06/27/2016) 20 tablet 0  . naproxen (NAPROSYN) 500 MG tablet Take 1 tablet (500 mg total) by mouth 2 (two) times daily. (Patient not taking: Reported on 06/27/2016) 30 tablet 0    Home: [place] expects to be discharged to::  Private residence Living Arrangements: Children (son) Available Help at Discharge: Family, Available PRN/intermittently, Friend(s) (works 1-7 pm; ) Type of Home: House Home Access: Stairs to enter Technical brewer of Steps: 2 Entrance Stairs-Rails: Right, Left, Can reach both Home Layout: One level Bathroom Shower/Tub: Tub/shower unit, Primary school teacher: Yes Home Equipment: None  Functional History: Prior Function Level of Independence: Independent Comments: drives, has her own business (baking) Functional Status:  Mobility: Bed Mobility Overal bed mobility: Modified Independent Transfers Overall transfer level: Needs assistance Equipment used: 1 person hand held assist Transfers: Sit to/from Stand Sit to Stand: Min assist General transfer comment: steady assist Ambulation/Gait Ambulation/Gait assistance: Min assist Ambulation Distance (Feet): 150 Feet (x2) Assistive device: 1 person hand held assist Gait Pattern/deviations: Step-through pattern General Gait Details: pt drifting right, staggering and scissoring trying to maintain directional stability.  Tried visual compensation, but pt still suffered dizziness with some mild spinning.  Speeding up did not correct for stability either.  Stability worsened mildly with fatigue  BP 140/89  (89) Gait velocity: decr Gait velocity interpretation: at or above normal speed for age/gender    ADL:    Cognition: Cognition Overall Cognitive Status: Within Functional Limits for tasks  assessed Orientation Level: Oriented X4 Cognition Arousal/Alertness: Awake/alert Behavior During Therapy: WFL for tasks assessed/performed Overall Cognitive Status: Within Functional Limits for tasks assessed  Blood pressure (!) 149/76, pulse 89, temperature 97.3 F (36.3 C), temperature source Oral, resp. rate 20, height 5\' 5"  (1.651 m), weight 53.1 kg (117 lb), SpO2 98 %. Physical Exam  Vitals reviewed. Constitutional: She is  oriented to person, place, and time. She appears well-developed and well-nourished.  HENT:  Head: Normocephalic and atraumatic.  Eyes: Conjunctivae and EOM are normal.  Neck: Normal range of motion. Neck supple. No thyromegaly present.  Cardiovascular: Normal rate and regular rhythm.   Respiratory: Effort normal and breath sounds normal. No respiratory distress.  GI: Soft. Bowel sounds are normal. She exhibits no distension.  Musculoskeletal: She exhibits no edema or tenderness.  Neurological: She is alert and oriented to person, place, and time.  Motor: 4+/5 throughout DTRs symmetric Sensation intact to light touch  Skin: Skin is warm and dry.  Psychiatric: She has a normal mood and affect. Her behavior is normal.    Results for orders placed or performed during the hospital encounter of 06/27/16 (from the past 24 hour(s))  Glucose, capillary     Status: Abnormal   Collection Time: 06/29/16  7:54 AM  Result Value Ref Range   Glucose-Capillary 106 (H) 65 - 99 mg/dL  Glucose, capillary     Status: Abnormal   Collection Time: 06/29/16 12:06 PM  Result Value Ref Range   Glucose-Capillary 124 (H) 65 - 99 mg/dL  Glucose, capillary     Status: Abnormal   Collection Time: 06/29/16  9:33 PM  Result Value Ref Range   Glucose-Capillary 147 (H) 65 - 99 mg/dL   Comment 1 Notify RN    Comment 2 Document in Chart    Ct Head Wo Contrast  Result Date: 06/29/2016 CLINICAL DATA:  81 year old female with small right thalamic hemorrhage discovered on 06/27/2016 following presentation of dizziness lower extremity weakness and unsteady gait. Initial encounter. EXAM: CT HEAD WITHOUT CONTRAST TECHNIQUE: Contiguous axial images were obtained from the base of the skull through the vertex without intravenous contrast. COMPARISON:  Head CT without contrast 06/27/2016 and earlier. FINDINGS: Brain: Small focus of hyperdense hemorrhage centered along the inferior right thalamus at the confluence with the right  cerebral peduncle is stable encompassing 15 x 12 x 9 mm (estimated blood volume 1 mL). Mild surrounding edema without significant regional mass effect. Superimposed confluent bilateral cerebral white matter hypodensity and heterogeneity in the other deep gray matter nuclei. 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 No extra-axial hemorrhage. No ventriculomegaly. No superimposed acute cortically based infarct. Vascular: Calcified atherosclerosis at the skull base. Generalized intracranial artery dolichoectasia. Skull: No acute osseous abnormality identified. Sinuses/Orbits: Acute on chronic appearing sphenoid sinusitis has not significantly changed. Other Visualized paranasal sinuses and mastoids are stable and well pneumatized. Other: No acute orbit or scalp soft tissue finding. IMPRESSION: 1. Stable small volume intra-axial hemorrhage at the caudal right thalamus/right cerebral peduncle. Mild surrounding edema with no significant regional mass effect. 2. No new intracranial abnormality. Underlying chronic small vessel disease. 3. Acute on chronic sphenoid sinusitis. Electronically Signed   By: Genevie Ann M.D.   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  Records reviewed and summated above. Stroke: Continue secondary stroke prophylaxis and Risk Factor Modification listed below:   Blood Pressure Management:  Continue current medication with prn's  with permisive HTN per primary team Diabetes management:    1. Does the need for close, 24 hr/day medical supervision in concert with the patient's rehab needs make it unreasonable for this patient to be served in a less intensive setting? Yes  2. Co-Morbidities requiring supervision/potential complications: hypertension (monitor and provide prns in accordance with increased physical exertion and pain), diabetes mellitus (Monitor in accordance  with exercise and adjust meds as necessary), HLD (cont meds) 3. Due to safety, disease management and patient education, does the patient require 24 hr/day rehab nursing? Yes 4. Does the patient require coordinated care of a physician, rehab nurse, PT (1-2 hrs/day, 5 days/week) and OT (1-2 hrs/day, 5 days/week) to address physical and functional deficits in the context of the above medical diagnosis(es)? Yes Addressing deficits in the following areas: balance, endurance, locomotion, strength, transferring, toileting and psychosocial support 5. Can the patient actively participate in an intensive therapy program of at least 3 hrs of therapy per day at least 5 days per week? Yes 6. The potential for patient to make measurable gains while on inpatient rehab is excellent and good 7. Anticipated functional outcomes upon discharge from inpatient rehab are modified independent  with PT, modified independent with OT, n/a with SLP. 8. Estimated rehab length of stay to reach the above functional goals is: 6-8 days. 9. Does the patient have adequate social supports and living environment to accommodate these discharge functional goals? Yes 10. Anticipated D/C setting: Home 11. Anticipated post D/C treatments: HH therapy and Home excercise program 12. Overall Rehab/Functional Prognosis: good  RECOMMENDATIONS: This patient's condition is appropriate for continued rehabilitative care in the following setting: Pt with functional improvement since evaluation, however, will be alone at home.  Recommend CIR to maximize functional independence, prior to d/c home. Patient has agreed to participate in recommended program. Yes Note that insurance prior authorization may be required for reimbursement for recommended care.  Comment: Rehab Admissions Coordinator to follow up.  Delice Lesch, MD, Mellody Drown 06/30/2016
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
## 2016-06-30 NOTE — Progress Notes (Signed)
I met with pt, a son, and grand daughter at bedside. We discussed a possible inpt rehab admission pending insurance approval. I await OT eval so that I can begin insurance authorization. [phone]
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
## 2016-07-01 ENCOUNTER — Inpatient Hospital Stay (HOSPITAL_COMMUNITY)
Admission: AD | Admit: 2016-07-01 | Discharge: 2016-07-12 | DRG: 057 | Disposition: A | Discharge status: Home Health | Payer: Medicare Other | Source: Intra-hospital | Attending: Physical Medicine & Rehabilitation | Admitting: Physical Medicine & Rehabilitation

## 2016-07-01 DIAGNOSIS — I69198 Other sequelae of nontraumatic intracerebral hemorrhage: Secondary | ICD-10-CM | POA: Diagnosis not present

## 2016-07-01 DIAGNOSIS — I161 Hypertensive emergency: Secondary | ICD-10-CM

## 2016-07-01 DIAGNOSIS — E119 Type 2 diabetes mellitus without complications: Secondary | ICD-10-CM | POA: Diagnosis not present

## 2016-07-01 DIAGNOSIS — R040 Epistaxis: Secondary | ICD-10-CM

## 2016-07-01 DIAGNOSIS — R269 Unspecified abnormalities of gait and mobility: Secondary | ICD-10-CM | POA: Diagnosis not present

## 2016-07-01 DIAGNOSIS — N189 Chronic kidney disease, unspecified: Secondary | ICD-10-CM

## 2016-07-01 DIAGNOSIS — K5900 Constipation, unspecified: Secondary | ICD-10-CM | POA: Diagnosis not present

## 2016-07-01 DIAGNOSIS — I610 Nontraumatic intracerebral hemorrhage in hemisphere, subcortical: Secondary | ICD-10-CM

## 2016-07-01 DIAGNOSIS — N183 Chronic kidney disease, stage 3 (moderate): Secondary | ICD-10-CM

## 2016-07-01 DIAGNOSIS — I10 Essential (primary) hypertension: Secondary | ICD-10-CM

## 2016-07-01 DIAGNOSIS — E1122 Type 2 diabetes mellitus with diabetic chronic kidney disease: Secondary | ICD-10-CM

## 2016-07-01 DIAGNOSIS — Z79899 Other long term (current) drug therapy: Secondary | ICD-10-CM

## 2016-07-01 DIAGNOSIS — Z9114 Patient's other noncompliance with medication regimen: Secondary | ICD-10-CM | POA: Diagnosis not present

## 2016-07-01 DIAGNOSIS — I129 Hypertensive chronic kidney disease with stage 1 through stage 4 chronic kidney disease, or unspecified chronic kidney disease: Secondary | ICD-10-CM

## 2016-07-01 DIAGNOSIS — E8809 Other disorders of plasma-protein metabolism, not elsewhere classified: Secondary | ICD-10-CM | POA: Diagnosis not present

## 2016-07-01 DIAGNOSIS — M1990 Unspecified osteoarthritis, unspecified site: Secondary | ICD-10-CM | POA: Diagnosis not present

## 2016-07-01 DIAGNOSIS — E782 Mixed hyperlipidemia: Secondary | ICD-10-CM

## 2016-07-01 LAB — GLUCOSE, CAPILLARY
GLUCOSE-CAPILLARY: 84 mg/dL (ref 65–99)
GLUCOSE-CAPILLARY: 84 mg/dL (ref 65–99)
GLUCOSE-CAPILLARY: 137 mg/dL — AB (ref 65–99)
Glucose-Capillary: 106 mg/dL — ABNORMAL HIGH (ref 65–99)
Glucose-Capillary: 70 mg/dL (ref 65–99)

## 2016-07-01 MED ORDER — ACETAMINOPHEN 650 MG RE SUPP: 650.0000 mg | RECTAL | Status: DC | PRN

## 2016-07-01 MED ORDER — IRBESARTAN 300 MG PO TABS
150.0000 mg | ORAL_TABLET | Freq: Every day | ORAL | Status: DC
  Administered 2016-07-02 – 2016-07-12 (×11): 150 mg via ORAL
  Filled 2016-07-01 (×11): qty 1

## 2016-07-01 MED ORDER — ONDANSETRON HCL 4 MG/2ML IJ SOLN: 4.0000 mg | Freq: Four times a day (QID) | INTRAMUSCULAR | Status: DC | PRN

## 2016-07-01 MED ORDER — BISACODYL 10 MG RE SUPP: 10.0000 mg | Freq: Every day | RECTAL | Status: DC | PRN

## 2016-07-01 MED ORDER — ROSUVASTATIN CALCIUM 5 MG PO TABS: 10.0000 mg | ORAL_TABLET | Freq: Every day | ORAL | Status: DC

## 2016-07-01 MED ORDER — ACETAMINOPHEN 325 MG PO TABS
650.0000 mg | ORAL_TABLET | ORAL | Status: DC | PRN
  Administered 2016-07-02 – 2016-07-11 (×21): 650 mg via ORAL
  Filled 2016-07-01 (×20): qty 2

## 2016-07-01 MED ORDER — LORATADINE 10 MG PO TABS
10.0000 mg | ORAL_TABLET | Freq: Every day | ORAL | Status: DC
  Administered 2016-07-02 – 2016-07-12 (×11): 10 mg via ORAL
  Filled 2016-07-01 (×11): qty 1

## 2016-07-01 MED ORDER — SORBITOL 70 % SOLN: 30.0000 mL | Freq: Every day | Status: DC | PRN

## 2016-07-01 MED ORDER — AMLODIPINE BESYLATE 5 MG PO TABS
5.0000 mg | ORAL_TABLET | Freq: Every day | ORAL | Status: DC
  Administered 2016-07-02 – 2016-07-12 (×11): 5 mg via ORAL
  Filled 2016-07-01 (×11): qty 1

## 2016-07-01 MED ORDER — CALCIUM CARBONATE ANTACID 500 MG PO CHEW
1.0000 | CHEWABLE_TABLET | Freq: Four times a day (QID) | ORAL | Status: DC | PRN
Start: 2016-07-01 — End: 2016-07-12
  Administered 2016-07-02 – 2016-07-10 (×7): 200 mg via ORAL
  Filled 2016-07-01 (×8): qty 1

## 2016-07-01 MED ORDER — ONDANSETRON HCL 4 MG PO TABS
4.0000 mg | ORAL_TABLET | Freq: Four times a day (QID) | ORAL | Status: DC | PRN
  Administered 2016-07-08: 4 mg via ORAL
  Filled 2016-07-01: qty 1

## 2016-07-01 MED ORDER — PANTOPRAZOLE SODIUM 40 MG PO TBEC
40.0000 mg | DELAYED_RELEASE_TABLET | Freq: Every day | ORAL | Status: DC
  Administered 2016-07-01 – 2016-07-11 (×11): 40 mg via ORAL
  Filled 2016-07-01 (×11): qty 1

## 2016-07-01 NOTE — PMR Pre-admission (Signed)
PMR Admission Coordinator Pre-Admission Assessment  Patient: Anna Hoffman is an 81 y.o., female MRN: [id number] DOB: September 29, 1934 Height: 5\' 5"  (165.1 cm) Weight: 53.1 kg (117 lb)              Insurance Information HMO: yes    PPO:      PCP:      IPA:     80/20:      OTHER: medicare advantage plan PRIMARY: [employer] Medicare      Policy#: [id number]      Subscriber: pt CM Name: Orvan July      Phone#: [phone]     Fax#: [phone] Pre-Cert#: [id number]      Employer: retired. Approved for 7 days with f/u CM Sherlynn Stalls phone: [phone] fax: EPIC access Benefits:  Phone #: [phone]     Name: 06/30/16 Eff. Date: 04/04/16     Deduct: none      Out of Pocket Max: [phone]      Life Max: none CIR: $430 copay per day days 1-4 then covers 100%      SNF: no copay days 1-20: $160 copay per day days 21-62; no copay days 63-100 Outpatient: $40 copay per visit     Co-Pay: visits per medical necessity Home Health: 100%      Co-Pay:  Visits per medical necessity DME: 80%     Co-Pay: 20% Providers: in network  SECONDARY: none        Medicaid Application Date:       Case Manager:  Disability Application Date:       Case Worker:   Emergency Contact Information Contact Information    Name Relation Home Work Mary Esther Son   [phone]   Burna Mortimer   [phone]   Katrina Stack   [phone]     Current Medical History  Patient Admitting Diagnosis:  Right thalamic hemorrhage secondary to hypertensive crisis  History of Present Illness:    : HPI: Anna Hoffman a 81 y.o.right handed femalewith history of hypertension, NIDDM.Presented 06/27/2016 with sudden onset of dizziness, headache and unsteady gait. Blood pressure 185/102. CT of the head showed acute hemorrhage within the right thalamus measuring 1.7 x 1 cm with surrounding edema. No significant mass effect. Chronic small vessel ischemic changes in the white matter. Placed on  intravenous cleviprex. Neurology follow-up conservative care provided monitoring of blood pressure. Follow-up cranial CT scans stable with no new intracranial abnormality as compared to prior study. Patient did not receive TPA due to [place]. Tolerating a regular diet. Creatinine on admission 1.07 and monitored.   Total: 0 NIHSS    Past Medical History  Past Medical History:  Diagnosis Date  . Diabetes mellitus without complication ([place])   . Hypertension   . Hypokalemia     Family History  family history includes Prostate cancer in her father.  Prior Rehab/Hospitalizations:  Has the patient had major surgery during 100 days prior to admission? No  Current Medications   Current Facility-Administered Medications:  .  acetaminophen (TYLENOL) tablet 650 mg, 650 mg, Oral, Q4H PRN, 650 mg at 06/30/16 2052 **OR** acetaminophen (TYLENOL) suppository 650 mg, 650 mg, Rectal, Q4H PRN, Greta Doom, MD .  amLODipine (NORVASC) tablet 5 mg, 5 mg, Oral, Daily, David L Rinehuls, PA-C, 5 mg at 07/01/16 1058 .  bisacodyl (DULCOLAX) suppository 10 mg, 10 mg, Rectal, Daily PRN, Garvin Fila, MD .  calcium carbonate (TUMS - dosed in mg elemental calcium) chewable tablet 200  mg of elemental calcium, 1 tablet, Oral, Q6H PRN, Greta Doom, MD, 200 mg of elemental calcium at 06/28/16 1649 .  irbesartan (AVAPRO) tablet 150 mg, 150 mg, Oral, Daily, David L Rinehuls, PA-C, 150 mg at 07/01/16 1058 .  labetalol (NORMODYNE,TRANDATE) injection 10 mg, 10 mg, Intravenous, Q10 min PRN, Greta Doom, MD, 10 mg at 06/29/16 0554 .  loratadine (CLARITIN) tablet 10 mg, 10 mg, Oral, Daily, David L Rinehuls, PA-C, 10 mg at 07/01/16 1058 .  ondansetron (ZOFRAN) injection 4 mg, 4 mg, Intravenous, Q4H PRN, Greta Doom, MD, 4 mg at 06/30/16 0918 .  pantoprazole (PROTONIX) EC tablet 40 mg, 40 mg, Oral, QHS, Einar Grad, RPH, 40 mg at 06/30/16 2052  Patients Current Diet: Diet regular Room  service appropriate? 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  Independent  Indoor Mobility: Did the patient need assistance with walking from room to room (with or without device)? Independent  Stairs: Did the patient need assistance with internal or external stairs (with or without device)? Independent  Functional Cognition: Did the patient need help planning regular tasks such as shopping or remembering to take medications? Independent  Current Functional Level Cognition  Arousal/Alertness: Awake/alert Overall Cognitive Status: Impaired/Different from baseline Current Attention Level: Sustained (easily distracted with more than one person in room) Orientation Level: Oriented X4 Safety/Judgement: Decreased awareness of safety, Decreased awareness of deficits General Comments: poor awareness of deficits (denied changes post-stroke); Lt inattention and unaware when leaning/losing balance to her left Attention: Alternating Sustained Attention: Appears intact Alternating Attention: Impaired Alternating  Attention Impairment: Verbal basic Memory: Appears intact Awareness: Impaired Awareness Impairment: Emergent impairment, Anticipatory impairment Problem Solving: Impaired Problem Solving Impairment: Functional basic, Functional complex Safety/Judgment: Impaired    Extremity Assessment (includes Sensation/Coordination)  Upper Extremity Assessment: LUE deficits/detail LUE Deficits / Details: inattention   Lower Extremity Assessment: RLE deficits/detail, LLE deficits/detail RLE Deficits / Details: AROM WNL; knee extension 5/5, ankle DF 5/5 LLE Deficits / Details: AROM WNL; knee extension 4/5, ankle DF 5/5 LLE Sensation:  (denies changes)    ADLs  Overall ADL's : Needs assistance/impaired Grooming: Wash/dry hands, Minimal assistance, Standing Grooming Details (indicate cue type and reason): pt leaning heavily on wall on L side with lack of awareness to L knee buckle Lower Body Dressing: Minimal assistance Lower Body Dressing Details (indicate cue type and reason): able to don shoe with incr time. pt reports its much harder than normal. pt required x4 trials to complete task Toilet Transfer: Moderate assistance Toilet Transfer Details (indicate cue type and reason): hand held (A) with L Le dragging. pt when asked to incr step length with more of a MARCHing style step.  pt unable to just swing the L LE in a normal pattern Toileting- Clothing Manipulation and Hygiene: Min guard Toileting - Clothing Manipulation Details (indicate cue type and reason): sitting but required (A) to get toilet patper Functional mobility during ADLs: Moderate assistance General ADL Comments: pt heavily leaning on therapist with L side with transfer hand held (A)     Mobility  Overal bed mobility: Modified Independent    Transfers  Overall transfer level: Needs assistance Equipment used: 1 person hand held assist, None Transfers: Sit to/from Stand Sit to Stand: Min assist, Min guard General transfer comment: x  3; pt unsteady with lean to her left; pt reaching for UE support    Ambulation / Gait / Stairs / Wheelchair Mobility  Ambulation/Gait Ambulation/Gait assistance: Mod assist Ambulation Distance (Feet): 55 Feet (standing rest (lean on wall); 55) Assistive device: None Gait Pattern/deviations: Step-through pattern, Decreased stride length, Decreased weight shift to right, Decreased dorsiflexion - left, Staggering left, Drifts right/left General Gait Details: consistently drifting to her left, yet she denies; given visual cues to walk down middle of hall "on black tiles" and pt with drift to Lt with 2-3 steps (repeated x 4 with pt ultimately aware of Lt drift); planned failure to allow incr LOB with pt falling towards her left during gait and required mod assist to recover by the time she acknowledged she was losing her balance Gait velocity: decr Gait velocity interpretation: at or above normal speed for age/gender    Posture / Balance Balance Overall balance assessment: Needs assistance Sitting-balance support: No upper extremity supported, Feet supported Sitting balance-Leahy Scale: Good Standing balance support: No upper extremity supported, During functional activity Standing balance-Leahy Scale: Zero Standing balance comment: able to challenge balance more today (BP stable); poor awareness of LBO    Special needs/care consideration BiPAP/CPAP  N/a CPM  N/a Continuous Drip IV  N/a Dialysis  N/a Life Vest  N/a Oxygen  N/a Special Bed  N/a Trach Size  N/a Wound Vac (area)  N/a Skin intact                           Bowel mgmt: continent Bladder mgmt: continent Diabetic mgmt Hgb A1C 6 Patient reports she ran out of her HTN meds pta Bed and chair alarm needed due to decreased safety awareness   Previous Home Environment Living Arrangements: Children (lives with son, [name])  Lives With: Son Available Help at Discharge: Family, Available PRN/intermittently, Friend(s) Type of Home:  House Home Layout: One level Home Access: Stairs to enter Entrance Stairs-Rails: Right, Left, Can reach both Entrance Stairs-Number of Steps: 2 Bathroom Shower/Tub: Tub/shower unit, Architectural technologist: Standard Bathroom Accessibility: Yes How Accessible: Accessible via walker Home Care Services: No  Discharge Living Setting Plans for Discharge Living Setting: Patient's home, Lives with (comment) (son, [name]) Type of Home at Discharge: House Discharge Home Layout: One level Discharge Home Access: Stairs to enter Entrance Stairs-Rails: Right, Left, Can reach both Entrance Stairs-Number of Steps: 2 Discharge Bathroom Shower/Tub: Tub/shower unit, Curtain Discharge Bathroom Toilet: Standard Discharge Bathroom Accessibility: Yes How Accessible: Accessible via walker Does the patient have any problems obtaining your medications?: Yes (Describe) (pt states she had run out of her HTN meds pta)  Social/Family/Support Systems Patient Roles: Parent, Science writer Information: [name], local son Anticipated Caregiver: son and grand daughter who are local Anticipated Caregiver's Contact Information: see above Ability/Limitations of Caregiver: son works Building control surveyor Availability: Intermittent Discharge Plan Discussed with  Primary Caregiver: Yes Is Caregiver In Agreement with Plan?: Yes Does Caregiver/Family have Issues with Lodging/Transportation while Pt is in Rehab?: No  Goals/Additional Needs Patient/Family Goal for Rehab: Mod I with PT, OT, and SLP Expected length of stay: [facility] 6-8 days Pt/Family Agrees to Admission and willing to participate: Yes Program Orientation Provided & Reviewed with Pt/Caregiver Including Roles  & Responsibilities: Yes  Decrease burden of Care through IP rehab admission: n/a  Possible need for SNF placement upon discharge:  Not anticipated  Patient Condition: This patient's condition remains as documented in the consult dated 06/30/2016, in which the  Rehabilitation Physician determined and documented that the patient's condition is appropriate for intensive rehabilitative care in an inpatient rehabilitation facility. Will admit to inpatient rehab today.   Preadmission Screen Completed By:  Cleatrice Burke, 07/01/2016 11:31 AM ______________________________________________________________________   Discussed status with Dr. Naaman Plummer on 07/01/2016 at  1131 and received telephone approval for admission today.  Admission Coordinator:  Cleatrice Burke, time 5465 Date 07/01/2016

## 2016-07-01 NOTE — Progress Notes (Signed)
STROKE TEAM PROGRESS NOTE   SUBJECTIVE (INTERVAL HISTORY) Patient is stable and ready for dc to rehab today   OBJECTIVE Temp:  [97.8 F (36.6 C)-98.7 F (37.1 C)] 98.1 F (36.7 C) (11/28 0549) Pulse Rate:  [76-90] 76 (11/28 0549) Cardiac Rhythm: Heart block;Normal sinus rhythm (11/28 0705) Resp:  [16-19] 18 (11/28 0549) BP: (138-158)/(67-83) 158/83 (11/28 0549) SpO2:  [97 %-99 %] 97 % (11/28 0549)  CBC:   Recent Labs Lab 06/27/16 1853 06/27/16 1907  WBC 8.4  --   NEUTROABS 4.8  --   HGB 12.7 13.9  HCT 38.7 41.0  MCV 92.6  --   PLT 400  --     Basic Metabolic Panel:   Recent Labs Lab 06/27/16 1853 06/27/16 1907 06/30/16 0706  NA 137 139 138  K 3.6 3.6 4.1  CL 105 103 106  CO2 23  --  25  GLUCOSE 113* 109* 100*  BUN 23* 24* 21*  CREATININE 1.07* 1.10* 1.00  CALCIUM 9.2  --  9.0    Lipid Panel:     Component Value Date/Time   CHOL 224 (H) 06/29/2016 0330   TRIG 180 (H) 06/29/2016 0330   HDL 64 06/29/2016 0330   CHOLHDL 3.5 06/29/2016 0330   VLDL 36 06/29/2016 0330   LDLCALC 124 (H) 06/29/2016 0330   HgbA1c:  Lab Results  Component Value Date   HGBA1C 6.0 (H) 06/29/2016   Urine Drug Screen: No results found for: LABOPIA, COCAINSCRNUR, LABBENZ, AMPHETMU, THCU, LABBARB    IMAGING No results found.    PHYSICAL EXAM Constitutional: Appears well-developed and well-nourished.  Psych: Affect appropriate to situation Eyes: No scleral injection HENT: No OP obstrucion Head: Normocephalic.  Cardiovascular: Normal rate and regular rhythm.  Respiratory: Effort normal and breath sounds normal to anterior ascultation GI: Soft.  No distension. There is no tenderness.  Skin: WDI Neuro: Mental Status: Patient is awake, alert, oriented to person, place, month, year, and situation. Patient is able to give a clear and coherent history. No signs of aphasia or neglect Cranial Nerves: II: Visual Fields are full. Pupils are equal, round, and reactive to  light.   III,IV, VI: EOMI without ptosis or diploplia.  V: Facial sensation is symmetric to temperature VII: Facial movement is Notable for mild left facial weakness VIII: hearing is intact to voice X: Uvula elevates symmetrically XI: Shoulder shrug is symmetric. XII: tongue is midline without atrophy or fasciculations.  Motor: Tone is normal. Bulk is normal. 5/5 strength was present in all four extremities.  Sensory: Sensation is symmetric to light touch and temperature in the arms and legs. Cerebellar: FNF and HKS are intact bilaterally   ASSESSMENT/PLAN [name] is a 81 y.o. female with history of hypertension, hyperlipidemia, diabetes mellitus, left renal artery  embolization of angiomyolipoma, and hypokalemia presenting with elevated blood pressure, dizziness, unsteadiness, and headache.  She did not receive IV t-PA due to [place].  Right thalamic hemorrhage:  Non-dominant - believed secondary to hypertension s/p medication noncompliance.   Head CT -  Acute hemorrhage within the right thalamus   Carotid Doppler -not done  2D Echo - 02/06/2016 - EF 65-70%.  LDL - 124  HgbA1c - 6.0  VTE prophylaxis - SCDs Diet regular Room service appropriate? Yes; Fluid consistency: Thin  No antithrombotic prior to admission, now on No antithrombotic secondary to thalamic hemorrhage.  Ongoing aggressive stroke risk factor management  Therapy recommendations: CIR, awaiting insurance approval  Disposition: CLR  Hypertension  Stable -  on oral HTN meds  Initially treated with cleviprex  Hypertension goal < 180 goal Resumed home blood pressure medications (hold HCTZ for now secondary to renal insufficiency) Long-term BP goal normotensive  Hyperlipidemia  Home meds:  Crestor 10 mg daily  - not resumed in hospital secondary to hemorrhage.   LDL 124, goal < 70  Confirm compliance - Consider increase dose to 20 mg daily   Resume statin at discharge  Other Stroke Risk  Factors  Advanced age  Other Active Problems  Renal insufficiency - BUN 24 ; creatinine 1.10  Hospital day # 4  I have personally examined this patient, reviewed notes, independently viewed imaging studies, participated in medical decision making and plan of care.ROS completed by me personally and pertinent positives fully documented  I have made any additions or clarifications directly to the above note. Patient being transferred to inpatient rehabilitation today. Recommend follow-up as an outpatient in stroke clinic in 6 weeks.  Antony Contras, MD Medical Director [facility] Pager: [phone] 07/01/2016 12:28 PM   To contact Stroke Continuity provider, please refer to [email or web address] After hours, contact General Neurology

## 2016-07-01 NOTE — Progress Notes (Signed)
Anna Gong, RN Rehab Admission Coordinator Signed Physical Medicine and Rehabilitation  PMR Pre-admission Date of Service: 07/01/2016 10:01 AM  Related encounter: ED to Hosp-Admission (Current) from 06/27/2016 in [place]       [] Hide copied text PMR Admission Coordinator Pre-Admission Assessment  Patient: Anna Hoffman is an 81 y.o., female MRN: [id number] DOB: 07-06-1935 Height: 5\' 5"  (165.1 cm) Weight: 53.1 kg (117 lb)                                                                                                                                                  Insurance Information HMO: yes    PPO:      PCP:      IPA:     80/20:      OTHER: medicare advantage plan PRIMARY: [employer] Medicare      Policy#: [id number]      Subscriber: pt CM Name: Anna Hoffman      Phone#: [phone]     Fax#: [phone] Pre-Cert#: [id number]      Employer: retired. Approved for 7 days with f/u CM Anna Hoffman phone: [phone] fax: EPIC access Benefits:  Phone #: [phone]     Name: 06/30/16 Eff. Date: 04/04/16     Deduct: none      Out of Pocket Max: [id number]      Life Max: none CIR: $430 copay per day days 1-4 then covers 100%      SNF: no copay days 1-20: $160 copay per day days 21-62; no copay days 63-100 Outpatient: $40 copay per visit     Co-Pay: visits per medical necessity Home Health: 100%      Co-Pay:  Visits per medical necessity DME: 80%     Co-Pay: 20% Providers: in network  SECONDARY: none        Medicaid Application Date:       Case Manager:  Disability Application Date:       Case Worker:   Emergency Contact Information        Contact Information    Name Relation Home Work [place] Son   [phone]   Burna Mortimer   [phone]   Anna Hoffman   [phone]     Current Medical History  Patient Admitting Diagnosis:  Right thalamic hemorrhage secondary to hypertensive  crisis  History of Present Illness:    : HPI: Anna Hoffman a 81 y.o.right handed femalewith history of hypertension, NIDDM.Presented 06/27/2016 with sudden onset of dizziness, headache and unsteady gait. Blood pressure 185/102. CT of the head showed acute hemorrhage within the right thalamus measuring 1.7 x 1 cm with surrounding edema. No significant mass effect. Chronic small vessel ischemic changes in the white matter. Placed on intravenous cleviprex. Neurology follow-up conservative care provided monitoring of blood pressure. Follow-up cranial CT  scans stable with no new intracranial abnormality as compared to prior study. Patient did not receive TPA due to [place]. Tolerating a regular diet. Creatinine on admission 1.07 and monitored.   Total: 0 NIHSS    Past Medical History      Past Medical History:  Diagnosis Date  . Diabetes mellitus without complication ([place])   . Hypertension   . Hypokalemia     Family History  family history includes Prostate cancer in her father.  Prior Rehab/Hospitalizations:  Has the patient had major surgery during 100 days prior to admission? No  Current Medications   Current Facility-Administered Medications:  .  acetaminophen (TYLENOL) tablet 650 mg, 650 mg, Oral, Q4H PRN, 650 mg at 06/30/16 2052 **OR** acetaminophen (TYLENOL) suppository 650 mg, 650 mg, Rectal, Q4H PRN, Anna Doom, MD .  amLODipine (NORVASC) tablet 5 mg, 5 mg, Oral, Daily, Anna L Rinehuls, PA-C, 5 mg at 07/01/16 1058 .  bisacodyl (DULCOLAX) suppository 10 mg, 10 mg, Rectal, Daily PRN, Anna Fila, MD .  calcium carbonate (TUMS - dosed in mg elemental calcium) chewable tablet 200 mg of elemental calcium, 1 tablet, Oral, Q6H PRN, Anna Doom, MD, 200 mg of elemental calcium at 06/28/16 1649 .  irbesartan (AVAPRO) tablet 150 mg, 150 mg, Oral, Daily, Anna L Rinehuls, PA-C, 150 mg at 07/01/16 1058 .  labetalol (NORMODYNE,TRANDATE) injection 10 mg,  10 mg, Intravenous, Q10 min PRN, Anna Doom, MD, 10 mg at 06/29/16 0554 .  loratadine (CLARITIN) tablet 10 mg, 10 mg, Oral, Daily, Anna L Rinehuls, PA-C, 10 mg at 07/01/16 1058 .  ondansetron (ZOFRAN) injection 4 mg, 4 mg, Intravenous, Q4H PRN, Anna Doom, MD, 4 mg at 06/30/16 0918 .  pantoprazole (PROTONIX) EC tablet 40 mg, 40 mg, Oral, QHS, Anna Hoffman, Anna Hoffman, 40 mg at 06/30/16 2052  Patients Current Diet: Diet regular Room service appropriate? 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  Independent  Indoor Mobility: Did the patient need assistance with walking from room to room (with or without device)? Independent  Stairs: Did the patient need assistance with internal or external stairs (with or without device)? Independent  Functional Cognition: Did the patient need help planning regular tasks such as shopping or remembering to take medications? Independent  Current Functional Level Cognition Arousal/Alertness: Awake/alert Overall Cognitive Status:  Impaired/Different from baseline Current Attention Level: Sustained (easily distracted with more than one person in room) Orientation Level: Oriented X4 Safety/Judgement: Decreased awareness of safety, Decreased awareness of deficits General Comments: poor awareness of deficits (denied changes post-stroke); Lt inattention and unaware when leaning/losing balance to her left Attention: Alternating Sustained Attention: Appears intact Alternating Attention: Impaired Alternating Attention  Impairment: Verbal basic Memory: Appears intact Awareness: Impaired Awareness Impairment: Emergent impairment, Anticipatory impairment Problem Solving: Impaired Problem Solving Impairment: Functional basic, Functional complex Safety/Judgment: Impaired    Extremity Assessment (includes Sensation/Coordination) Upper Extremity Assessment: LUE deficits/detail LUE Deficits / Details: inattention   Lower Extremity Assessment: RLE deficits/detail, LLE deficits/detail RLE Deficits / Details: AROM WNL; knee extension 5/5, ankle DF 5/5 LLE Deficits / Details: AROM WNL; knee extension 4/5, ankle DF 5/5 LLE Sensation:  (denies changes)   ADLs Overall ADL's : Needs assistance/impaired Grooming: Wash/dry hands, Minimal assistance, Standing Grooming Details (indicate cue type and reason): pt leaning heavily on wall on L side with lack of awareness to L knee buckle Lower Body Dressing: Minimal assistance Lower Body Dressing Details (indicate cue type and reason): able to don shoe with incr time. pt reports its much harder than normal. pt required x4 trials to complete task Toilet Transfer: Moderate assistance Toilet Transfer Details (indicate cue type and reason): hand held (A) with L Le dragging. pt when asked to incr step length with more of a MARCHing style step. pt unable to just swing the L LE in a normal pattern Toileting- Clothing Manipulation and Hygiene: Min guard Toileting - Clothing Manipulation Details  (indicate cue type and reason): sitting but required (A) to get toilet patper Functional mobility during ADLs: Moderate assistance General ADL Comments: pt heavily leaning on therapist with L side with transfer hand held (A)    Mobility Overal bed mobility: Modified Independent   Transfers Overall transfer level: Needs assistance Equipment used: 1 person hand held assist, None Transfers: Sit to/from Stand Sit to Stand: Min assist, Min guard General transfer comment: x 3; pt unsteady with lean to her left; pt reaching for UE support   Ambulation / Gait / Stairs / Wheelchair Mobility Ambulation/Gait Ambulation/Gait assistance: Mod assist Ambulation Distance (Feet): 55 Feet (standing rest (lean on wall); 55) Assistive device: None Gait Pattern/deviations: Step-through pattern, Decreased stride length, Decreased weight shift to right, Decreased dorsiflexion - left, Staggering left, Drifts right/left General Gait Details: consistently drifting to her left, yet she denies; given visual cues to walk down middle of hall "on black tiles" and pt with drift to Lt with 2-3 steps (repeated x 4 with pt ultimately aware of Lt drift); planned failure to allow incr LOB with pt falling towards her left during gait and required mod assist to recover by the time she acknowledged she was losing her balance Gait velocity: decr Gait velocity interpretation: at or above normal speed for age/gender   Posture / Balance Balance Overall balance assessment: Needs assistance Sitting-balance support: No upper extremity supported, Feet supported Sitting balance-Leahy Scale: Good Standing balance support: No upper extremity supported, During functional activity Standing balance-Leahy Scale: Zero Standing balance comment: able to challenge balance more today (BP stable); poor awareness of LBO   Special needs/care consideration BiPAP/CPAP  N/a CPM  N/a Continuous Drip IV  N/a Dialysis  N/a Life Vest  N/a Oxygen   N/a Special Bed  N/a Trach Size  N/a Wound Vac (area)  N/a Skin intact                           Bowel mgmt: continent Bladder mgmt: continent Diabetic mgmt Hgb A1C 6 Patient reports she ran out of her HTN meds pta Bed and chair alarm needed due to decreased safety awareness   Previous Home Environment Living Arrangements: Children (lives with son, Anna Hoffman)  Lives With: Son  Available Help at Discharge: Family, Available PRN/intermittently, Friend(s) Type of Home: House Home Layout: One level Home Access: Stairs to enter Entrance Stairs-Rails: Right, Left, Can reach both Entrance Stairs-Number of Steps: 2 Bathroom Shower/Tub: Tub/shower unit, Architectural technologist: Standard Bathroom Accessibility: Yes How Accessible: Accessible via walker [place]: No  Discharge Living Setting Plans for Discharge Living Setting: Patient's home, Lives with (comment) (son, Anna Hoffman) Type of Home at Discharge: House Discharge Home Layout: One level Discharge Home Access: Stairs to enter Entrance Stairs-Rails: Right, Left, Can reach both Entrance Stairs-Number of Steps: 2 Discharge Bathroom Shower/Tub: Tub/shower unit, Curtain Discharge Bathroom Toilet: Standard Discharge Bathroom Accessibility: Yes How Accessible: Accessible via walker Does the patient have any problems obtaining your medications?: Yes (Describe) (pt states she had run out of her HTN meds pta)  Social/Family/Support Systems Patient Roles: Parent, Science writer Information: Anna Hoffman, local son Anticipated Caregiver: son and grand daughter who are local Anticipated Caregiver's Contact Information: see above Ability/Limitations of Caregiver: son works Building control surveyor Availability: Intermittent Discharge Plan Discussed with Primary Caregiver: Yes Is Caregiver In Agreement with Plan?: Yes Does Caregiver/Family have Issues with Lodging/Transportation while Pt is in Rehab?: No  Goals/Additional Needs Patient/Family  Goal for Rehab: Mod I with PT, OT, and SLP Expected length of stay: ELOS 6-8 days Pt/Family Agrees to Admission and willing to participate: Yes Program Orientation Provided & Reviewed with Pt/Caregiver Including Roles  & Responsibilities: Yes  Decrease burden of Care through IP rehab admission: n/a  Possible need for SNF placement upon discharge:  Not anticipated  Patient Condition: This patient's condition remains as documented in the consult dated 06/30/2016, in which the Rehabilitation Physician determined and documented that the patient's condition is appropriate for intensive rehabilitative care in an inpatient rehabilitation facility. Will admit to inpatient rehab today.   Preadmission Screen Completed By:  Cleatrice Burke, 07/01/2016 11:31 AM ______________________________________________________________________   Discussed status with Dr. Naaman Plummer on 07/01/2016 at  1131 and received telephone approval for admission today.  Admission Coordinator:  Cleatrice Burke, time 5790 Date 07/01/2016       Cosigned by: Meredith Staggers, MD at 07/01/2016 11:40 AM  Revision History

## 2016-07-01 NOTE — Progress Notes (Signed)
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 right handed female with history of hypertension, diabetes mellitus. Per chart review patient lives with son. 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 Son works during the day. Presented 06/27/2016 with sudden onset of dizziness, headache and unsteady gait. Blood pressure 185/102. CT of the head showed acute hemorrhage within the right thalamus measuring 1.7 x 1 cm with surrounding edema. No significant mass effect. Chronic small vessel ischemic changes in the white matter. Placed on intravenous cleviprex. Neurology follow-up conservative care provided monitoring of blood pressure. Follow-up cranial CT scans stable with no new intracranial abnormality as compared to prior study. Patient did not receive TPA due to [place]. Tolerating a regular diet. Creatinine on admission 1.07 and monitored. Physical therapy evaluation completed with recommendations of physical medicine rehabilitation consult.   Review of Systems  Constitutional: Negative for chills and fever.  HENT: Negative for hearing loss and tinnitus.   Eyes: Negative for blurred vision, double vision and discharge.  Respiratory: Negative for cough and shortness of breath.   Cardiovascular: Negative for chest pain, palpitations and leg swelling.  Gastrointestinal: Positive for constipation. Negative for nausea and vomiting.       GERD  Genitourinary:  Negative for dysuria and hematuria.  Musculoskeletal: Positive for joint pain and myalgias.  Skin: Negative for rash.  Neurological: Positive for dizziness, weakness and headaches. Negative for seizures.  All other systems reviewed and are negative.      Past Medical History:  Diagnosis Date  . Diabetes mellitus without complication ([place])   . Hypertension   . Hypokalemia         Past Surgical History:  Procedure Laterality Date  . ABDOMINAL HYSTERECTOMY    . ESOPHAGOGASTRODUODENOSCOPY N/A 09/01/2014   Procedure: ESOPHAGOGASTRODUODENOSCOPY (EGD);  Surgeon: Lear Ng, MD;  Location: [facility];  Service: Endoscopy;  Laterality: N/A;  . HIATAL HERNIA REPAIR N/A 09/04/2014   Procedure: LAPAROSCOPIC REPAIR OF HIATAL HERNIA;  Surgeon: Excell Seltzer, MD;  Location: [facility];  Service: General;  Laterality: N/A;  With MESH  . IR GENERIC HISTORICAL  04/10/2016   IR US GUIDE VASC ACCESS RIGHT 04/10/2016 Corrie Mckusick, DO [facility]  . IR GENERIC HISTORICAL  04/10/2016   IR ANGIOGRAM SELECTIVE EACH ADDITIONAL VESSEL 04/10/2016 Corrie Mckusick, DO [facility]  . IR GENERIC HISTORICAL  04/10/2016   IR ANGIOGRAM SELECTIVE EACH ADDITIONAL VESSEL 04/10/2016 Corrie Mckusick, DO [facility]  . IR GENERIC HISTORICAL  04/10/2016   IR EMBO TUMOR ORGAN ISCHEMIA INFARCT INC GUIDE ROADMAPPING 04/10/2016 Corrie Mckusick, DO [facility]  . IR GENERIC HISTORICAL  04/10/2016   IR ANGIOGRAM SELECTIVE EACH ADDITIONAL VESSEL 04/10/2016 Corrie Mckusick, DO [facility]  . IR GENERIC HISTORICAL  04/10/2016   IR ANGIOGRAM SELECTIVE EACH ADDITIONAL VESSEL 04/10/2016 Corrie Mckusick, DO [facility]  . IR GENERIC HISTORICAL  04/10/2016   IR RENAL SELECTIVE  UNI INC S&I MOD SED 04/10/2016 Corrie Mckusick, DO [facility]  . KNEE  SURGERY          Family History  Problem Relation Age of Onset  . Prostate cancer Father    Social History:  reports that she has never smoked. She has never used smokeless tobacco.  She reports that she does not drink alcohol or use drugs. Allergies:       Allergies  Allergen Reactions  . Amoxicillin Diarrhea    Has patient had a PCN reaction causing immediate rash, facial/tongue/throat swelling, SOB or lightheadedness with hypotension: no Has patient had a PCN reaction causing severe rash involving mucus membranes or skin necrosis: no Has patient had a PCN reaction that required hospitalization: no pharmacist consult Has patient had a PCN reaction occurring within the last 10 years: yes If all of the above answers are "NO", then may proceed with Cephalosporin use.  . Ampicillin Rash    Has patient had a PCN reaction causing immediate rash, facial/tongue/throat swelling, SOB or lightheadedness with hypotension: No Has patient had a PCN reaction causing severe rash involving mucus membranes or skin necrosis: No Has patient had a PCN reaction that required hospitalization No Has patient had a PCN reaction occurring within the last 10 years: No If all of the above answers are "NO", then may proceed with Cephalosporin use.  . Sulfa Antibiotics Rash         Medications Prior to Admission  Medication Sig Dispense Refill  . acetaminophen (TYLENOL) 325 MG tablet Take 2 tablets (650 mg total) by mouth every 6 (six) hours as needed for mild pain (or Fever >/= 101).    . Ascorbic Acid (VITAMIN C) 1000 MG tablet Take 1,000 mg by mouth daily.    . fexofenadine (ALLEGRA) 180 MG tablet Take 180 mg by mouth daily.    . Multiple Vitamins-Minerals (STRESS TAB NF PO) Take 1 tablet by mouth daily.    . NON FORMULARY Take 1 tablet by mouth daily. Magnesium and D3    . Olmesartan-Amlodipine-HCTZ (TRIBENZOR) 20-5-12.5 MG TABS Take 1 tablet by mouth daily.    Marland Kitchen omeprazole (PRILOSEC) 40 MG capsule Take 40 mg by mouth daily.    . rosuvastatin (CRESTOR) 10 MG tablet Take 10 mg by mouth daily.    . vitamin B-12 (CYANOCOBALAMIN) 1000 MCG tablet Take 1,000 mcg by mouth  daily.    . methocarbamol (ROBAXIN) 500 MG tablet Take 1 tablet (500 mg total) by mouth 3 (three) times daily between meals as needed. (Patient not taking: Reported on 06/27/2016) 20 tablet 0  . naproxen (NAPROSYN) 500 MG tablet Take 1 tablet (500 mg total) by mouth 2 (two) times daily. (Patient not taking: Reported on 06/27/2016) 30 tablet 0    Home: [place] expects to be discharged to:: Private residence Living Arrangements: Children (son) Available Help at Discharge: Family, Available PRN/intermittently, Friend(s) (works 1-7 pm; ) Type of Home: House Home Access: Stairs to enter Technical brewer of Steps: 2 Entrance Stairs-Rails: Right, Left, Can reach both Home Layout: One level Bathroom Shower/Tub: Tub/shower unit, Primary school teacher: Yes Home Equipment: None  Functional History: Prior Function Level of Independence: Independent Comments: drives, has her own business (baking) Functional Status:  Mobility: Bed Mobility Overal bed mobility: Modified Independent Transfers Overall transfer level: Needs assistance Equipment used: 1 person hand held assist Transfers: Sit to/from Stand Sit to Stand: Min assist General transfer comment: steady assist Ambulation/Gait Ambulation/Gait assistance: Min assist Ambulation Distance (Feet): 150 Feet (x2) Assistive device: 1 person hand held assist Gait Pattern/deviations: Step-through pattern General  Gait Details: pt drifting right, staggering and scissoring trying to maintain directional stability.  Tried visual compensation, but pt still suffered dizziness with some mild spinning.  Speeding up did not correct for stability either.  Stability worsened mildly with fatigue  BP 140/89  (89) Gait velocity: decr Gait velocity interpretation: at or above normal speed for age/gender    ADL:    Cognition: Cognition Overall Cognitive Status: Within Functional Limits for tasks assessed Orientation  Level: Oriented X4 Cognition Arousal/Alertness: Awake/alert Behavior During Therapy: WFL for tasks assessed/performed Overall Cognitive Status: Within Functional Limits for tasks assessed  Blood pressure (!) 149/76, pulse 89, temperature 97.3 F (36.3 C), temperature source Oral, resp. rate 20, height 5\' 5"  (1.651 m), weight 53.1 kg (117 lb), SpO2 98 %. Physical Exam  Vitals reviewed. Constitutional: She is oriented to person, place, and time. She appears well-developed and well-nourished.  HENT:  Head: Normocephalic and atraumatic.  Eyes: Conjunctivae and EOM are normal.  Neck: Normal range of motion. Neck supple. No thyromegaly present.  Cardiovascular: Normal rate and regular rhythm.   Respiratory: Effort normal and breath sounds normal. No respiratory distress.  GI: Soft. Bowel sounds are normal. She exhibits no distension.  Musculoskeletal: She exhibits no edema or tenderness.  Neurological: She is alert and oriented to person, place, and time.  Motor: 4+/5 throughout DTRs symmetric Sensation intact to light touch  Skin: Skin is warm and dry.  Psychiatric: She has a normal mood and affect. Her behavior is normal.    Lab Results Last 24 Hours       Results for orders placed or performed during the hospital encounter of 06/27/16 (from the past 24 hour(s))  Glucose, capillary     Status: Abnormal   Collection Time: 06/29/16  7:54 AM  Result Value Ref Range   Glucose-Capillary 106 (H) 65 - 99 mg/dL  Glucose, capillary     Status: Abnormal   Collection Time: 06/29/16 12:06 PM  Result Value Ref Range   Glucose-Capillary 124 (H) 65 - 99 mg/dL  Glucose, capillary     Status: Abnormal   Collection Time: 06/29/16  9:33 PM  Result Value Ref Range   Glucose-Capillary 147 (H) 65 - 99 mg/dL   Comment 1 Notify RN    Comment 2 Document in Chart       Imaging Results (Last 48 hours)  Ct Head Wo Contrast  Result Date: 06/29/2016 CLINICAL DATA:  81 year old female  with small right thalamic hemorrhage discovered on 06/27/2016 following presentation of dizziness lower extremity weakness and unsteady gait. Initial encounter. EXAM: CT HEAD WITHOUT CONTRAST TECHNIQUE: Contiguous axial images were obtained from the base of the skull through the vertex without intravenous contrast. COMPARISON:  Head CT without contrast 06/27/2016 and earlier. FINDINGS: Brain: Small focus of hyperdense hemorrhage centered along the inferior right thalamus at the confluence with the right cerebral peduncle is stable encompassing 15 x 12 x 9 mm (estimated blood volume 1 mL). Mild surrounding edema without significant regional mass effect. Superimposed confluent bilateral cerebral white matter hypodensity and heterogeneity in the other deep gray matter nuclei. 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 No extra-axial hemorrhage. No ventriculomegaly. No superimposed acute cortically based infarct. Vascular: Calcified atherosclerosis at the skull base. Generalized intracranial artery dolichoectasia. Skull: No acute osseous abnormality identified. Sinuses/Orbits: Acute on chronic appearing sphenoid sinusitis has not significantly changed. Other Visualized paranasal sinuses and mastoids are stable and well pneumatized. Other: No acute orbit or scalp soft tissue finding. IMPRESSION: 1.  Stable small volume intra-axial hemorrhage at the caudal right thalamus/right cerebral peduncle. Mild surrounding edema with no significant regional mass effect. 2. No new intracranial abnormality. Underlying chronic small vessel disease. 3. Acute on chronic sphenoid sinusitis. Electronically Signed   By: Genevie Ann M.D.   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  Records reviewed and summated above. Stroke: Continue secondary stroke prophylaxis and Risk Factor Modification listed below:   Blood  Pressure Management:  Continue current medication with prn's with permisive HTN per primary team Diabetes management:    1. Does the need for close, 24 hr/day medical supervision in concert with the patient's rehab needs make it unreasonable for this patient to be served in a less intensive setting? Yes  2. Co-Morbidities requiring supervision/potential complications: hypertension (monitor and provide prns in accordance with increased physical exertion and pain), diabetes mellitus (Monitor in accordance with exercise and adjust meds as necessary), HLD (cont meds) 3. Due to safety, disease management and patient education, does the patient require 24 hr/day rehab nursing? Yes 4. Does the patient require coordinated care of a physician, rehab nurse, PT (1-2 hrs/day, 5 days/week) and OT (1-2 hrs/day, 5 days/week) to address physical and functional deficits in the context of the above medical diagnosis(es)? Yes Addressing deficits in the following areas: balance, endurance, locomotion, strength, transferring, toileting and psychosocial support 5. Can the patient actively participate in an intensive therapy program of at least 3 hrs of therapy per day at least 5 days per week? Yes 6. The potential for patient to make measurable gains while on inpatient rehab is excellent and good 7. Anticipated functional outcomes upon discharge from inpatient rehab are modified independent  with PT, modified independent with OT, n/a with SLP. 8. Estimated rehab length of stay to reach the above functional goals is: 6-8 days. 9. Does the patient have adequate social supports and living environment to accommodate these discharge functional goals? Yes 10. Anticipated D/C setting: Home 11. Anticipated post D/C treatments: HH therapy and Home excercise program 12. Overall Rehab/Functional Prognosis: good  RECOMMENDATIONS: This patient's condition is appropriate for continued rehabilitative care in the following setting:  Pt with functional improvement since evaluation, however, will be alone at home.  Recommend CIR to maximize functional independence, prior to d/c home. Patient has agreed to participate in recommended program. Yes Note that insurance prior authorization may be required for reimbursement for recommended care.  Comment: Rehab Admissions Coordinator to follow up.  Delice Lesch, MD, [facility] 06/30/2016    Revision History              Routing History

## 2016-07-01 NOTE — Progress Notes (Signed)
Report given to [facility] Nurse. Patient is transported to [facility] for inpatient rehab.

## 2016-07-01 NOTE — Care Management Important Message (Signed)
Important Message  Patient Details  Name: Anna Hoffman MRN: [id number] Date of Birth: November 17, 1934   Medicare Important Message Given:  Yes    [name] 07/01/2016, 11:47 AM

## 2016-07-01 NOTE — Progress Notes (Signed)
I have insurance approval and pt is in agreement to admission today. I will make the arrangements. RN CM and SW are aware. [phone]

## 2016-07-01 NOTE — H&P (Signed)
Physical Medicine and Rehabilitation Admission H&P       Chief Complaint  Patient presents with  . Dizziness  . Gait Problem  : HPI: Anna Friedl Fulpis a 81 y.o.right handed femalewith history of hypertension, NIDDM.Per chart review patient lives with son. 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 Son works during the day.Presented 06/27/2016 with sudden onset of dizziness, headache and unsteady gait. Blood pressure 185/102. CT of the head showed acute hemorrhage within the right thalamus measuring 1.7 x 1 cm with surrounding edema. No significant mass effect. Chronic small vessel ischemic changes in the white matter. Placed on intravenous cleviprex. Neurology follow-up conservative care provided monitoring of blood pressure. Follow-up cranial CT scans stable with no new intracranial abnormality as compared to prior study. Patient did not receive TPA due to [place]. Tolerating a regular diet. Creatinine on admission 1.07 and monitored. Physical and occupational therapy evaluations completed with recommendations of physical medicine rehabilitation consult.Patient was admitted for comprehensive rehabilitation program  Review of Systems  Constitutional: Negative for chills, fever and weight loss.  HENT: Negative for ear pain, hearing loss and tinnitus.   Eyes: Negative for blurred vision and double vision.  Respiratory: Negative for cough and shortness of breath.   Cardiovascular: Negative for chest pain, palpitations, orthopnea and leg swelling.  Gastrointestinal: Positive for constipation. Negative for nausea and vomiting.  Genitourinary: Negative for dysuria, flank pain and hematuria.  Musculoskeletal: Positive for joint pain and myalgias.  Skin: Negative for rash.  Neurological: Positive for dizziness and headaches. Negative for tremors, seizures and loss of consciousness.  Endo/Heme/Allergies: Negative for polydipsia.    All other systems reviewed and are negative.      Past Medical History:  Diagnosis Date  . Diabetes mellitus without complication ([place])   . Hypertension   . Hypokalemia         Past Surgical History:  Procedure Laterality Date  . ABDOMINAL HYSTERECTOMY    . ESOPHAGOGASTRODUODENOSCOPY N/A 09/01/2014   Procedure: ESOPHAGOGASTRODUODENOSCOPY (EGD);  Surgeon: Lear Ng, MD;  Location: [facility];  Service: Endoscopy;  Laterality: N/A;  . HIATAL HERNIA REPAIR N/A 09/04/2014   Procedure: LAPAROSCOPIC REPAIR OF HIATAL HERNIA;  Surgeon: Excell Seltzer, MD;  Location: [facility];  Service: General;  Laterality: N/A;  With MESH  . IR GENERIC HISTORICAL  04/10/2016   IR US GUIDE VASC ACCESS RIGHT 04/10/2016 Corrie Mckusick, DO [facility]  . IR GENERIC HISTORICAL  04/10/2016   IR ANGIOGRAM SELECTIVE EACH ADDITIONAL VESSEL 04/10/2016 Corrie Mckusick, DO [facility]  . IR GENERIC HISTORICAL  04/10/2016   IR ANGIOGRAM SELECTIVE EACH ADDITIONAL VESSEL 04/10/2016 Corrie Mckusick, DO [facility]  . IR GENERIC HISTORICAL  04/10/2016   IR EMBO TUMOR ORGAN ISCHEMIA INFARCT INC GUIDE ROADMAPPING 04/10/2016 Corrie Mckusick, DO [facility]  . IR GENERIC HISTORICAL  04/10/2016   IR ANGIOGRAM SELECTIVE EACH ADDITIONAL VESSEL 04/10/2016 Corrie Mckusick, DO [facility]  . IR GENERIC HISTORICAL  04/10/2016   IR ANGIOGRAM SELECTIVE EACH ADDITIONAL VESSEL 04/10/2016 Corrie Mckusick, DO [facility]  . IR GENERIC HISTORICAL  04/10/2016   IR RENAL SELECTIVE  UNI INC S&I MOD SED 04/10/2016 Corrie Mckusick, DO [facility]  . KNEE SURGERY     Family History  Problem Relation Age of Onset  . Prostate cancer Father    Social History:  reports that she has never smoked. She has never used smokeless tobacco. She reports that  she does not drink alcohol or use drugs. Allergies:       Allergies  Allergen Reactions  . Amoxicillin Diarrhea    Has patient had a PCN reaction causing immediate rash,  facial/tongue/throat swelling, SOB or lightheadedness with hypotension: no Has patient had a PCN reaction causing severe rash involving mucus membranes or skin necrosis: no Has patient had a PCN reaction that required hospitalization: no pharmacist consult Has patient had a PCN reaction occurring within the last 10 years: yes If all of the above answers are "NO", then may proceed with Cephalosporin use.  . Ampicillin Rash    Has patient had a PCN reaction causing immediate rash, facial/tongue/throat swelling, SOB or lightheadedness with hypotension: No Has patient had a PCN reaction causing severe rash involving mucus membranes or skin necrosis: No Has patient had a PCN reaction that required hospitalization No Has patient had a PCN reaction occurring within the last 10 years: No If all of the above answers are "NO", then may proceed with Cephalosporin use.  . Sulfa Antibiotics Rash         Medications Prior to Admission  Medication Sig Dispense Refill  . acetaminophen (TYLENOL) 325 MG tablet Take 2 tablets (650 mg total) by mouth every 6 (six) hours as needed for mild pain (or Fever >/= 101).    . Ascorbic Acid (VITAMIN C) 1000 MG tablet Take 1,000 mg by mouth daily.    . fexofenadine (ALLEGRA) 180 MG tablet Take 180 mg by mouth daily.    . Multiple Vitamins-Minerals (STRESS TAB NF PO) Take 1 tablet by mouth daily.    . NON FORMULARY Take 1 tablet by mouth daily. Magnesium and D3    . Olmesartan-Amlodipine-HCTZ (TRIBENZOR) 20-5-12.5 MG TABS Take 1 tablet by mouth daily.    Marland Kitchen omeprazole (PRILOSEC) 40 MG capsule Take 40 mg by mouth daily.    . rosuvastatin (CRESTOR) 10 MG tablet Take 10 mg by mouth daily.    . vitamin B-12 (CYANOCOBALAMIN) 1000 MCG tablet Take 1,000 mcg by mouth daily.    . methocarbamol (ROBAXIN) 500 MG tablet Take 1 tablet (500 mg total) by mouth 3 (three) times daily between meals as needed. (Patient not taking: Reported on 06/27/2016) 20 tablet 0  .  naproxen (NAPROSYN) 500 MG tablet Take 1 tablet (500 mg total) by mouth 2 (two) times daily. (Patient not taking: Reported on 06/27/2016) 30 tablet 0    Home: [place] expects to be discharged to:: Private residence Living Arrangements: Children Available Help at Discharge: Family, Available PRN/intermittently, Friend(s) Type of Home: House Home Access: Stairs to enter [employer] of Steps: 2 Entrance Stairs-Rails: Right, Left, Can reach both Home Layout: One level Bathroom Shower/Tub: Tub/shower unit, Primary school teacher: Yes Home Equipment: None  Lives With: Son   Functional History: Prior Function Level of Independence: Independent Comments: drives, has her own business (Control and instrumentation engineer), 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  Functional Status:  Mobility: [place] bed mobility: Modified Independent Transfers Overall transfer level: Needs assistance Equipment used: 1 person hand held assist, None Transfers: Sit to/from Stand Sit to Stand: Min assist, Min guard General transfer comment: x 3; pt unsteady with lean to her left; pt reaching for UE support Ambulation/Gait Ambulation/Gait assistance: Mod assist Ambulation Distance (Feet): 55 Feet (standing rest (lean on wall); 55) Assistive device: None Gait Pattern/deviations: Step-through pattern, Decreased stride length, Decreased weight shift to right, Decreased dorsiflexion - left, Staggering left, Drifts right/left General Gait  Details: consistently drifting to her left, yet she denies; given visual cues to walk down middle of hall "on black tiles" and pt with drift to Lt with 2-3 steps (repeated x 4 with pt ultimately aware of Lt drift); planned failure to allow incr LOB with pt falling towards her left during gait and required mod assist to recover by the time she acknowledged she was losing her balance Gait velocity: decr Gait velocity  interpretation: at or above normal speed for age/gender    ADL: ADL Overall ADL's : Needs assistance/impaired Grooming: Wash/dry hands, Minimal assistance, Standing Grooming Details (indicate cue type and reason): pt leaning heavily on wall on L side with lack of awareness to L knee buckle Lower Body Dressing: Minimal assistance Lower Body Dressing Details (indicate cue type and reason): able to don shoe with incr time. pt reports its much harder than normal. pt required x4 trials to complete task Toilet Transfer: Moderate assistance Toilet Transfer Details (indicate cue type and reason): hand held (A) with L Le dragging. pt when asked to incr step length with more of a MARCHing style step. pt unable to just swing the L LE in a normal pattern Toileting- Clothing Manipulation and Hygiene: Min guard Toileting - Clothing Manipulation Details (indicate cue type and reason): sitting but required (A) to get toilet patper Functional mobility during ADLs: Moderate assistance General ADL Comments: pt heavily leaning on therapist with L side with transfer hand held (A)   Cognition: Cognition Overall Cognitive Status: Impaired/Different from baseline Arousal/Alertness: Awake/alert Orientation Level: Oriented X4 Attention: Alternating Sustained Attention: Appears intact Alternating Attention: Impaired Alternating Attention Impairment: Verbal basic Memory: Appears intact Awareness: Impaired Awareness Impairment: Emergent impairment, Anticipatory impairment Problem Solving: Impaired Problem Solving Impairment: Functional basic, Functional complex Safety/Judgment: Impaired Cognition Arousal/Alertness: Awake/alert Behavior During Therapy: WFL for tasks assessed/performed Overall Cognitive Status: Impaired/Different from baseline Area of Impairment: Attention, Memory, Safety/judgement, Awareness Current Attention Level: Sustained (easily distracted with more than one person in room) Memory:  Decreased short-term memory (did not recall any previous PT sessions) Safety/Judgement: Decreased awareness of safety, Decreased awareness of deficits Awareness:  (pre-intellectual) General Comments: poor awareness of deficits (denied changes post-stroke); Lt inattention and unaware when leaning/losing balance to her left  Physical Exam: Blood pressure (!) 158/83, pulse 76, temperature 98.1 F (36.7 C), temperature source Oral, resp. rate 18, height '5\' 5"'  (1.651 m), weight 53.1 kg (117 lb), SpO2 97 %. Physical Exam  Vitals reviewed. Constitutional: She appears well-developed and well-nourished.  HENT:  Head: Normocephalic.  Right Ear: External ear normal.  Left Ear: External ear normal.  Mouth/Throat: Oropharynx is clear and moist.  Eyes: EOM are normal. Pupils are equal, round, and reactive to light. No scleral icterus.  Neck: Normal range of motion. Neck supple. No JVD present. No tracheal deviation present. No thyromegaly present.  Cardiovascular: Normal rate and regular rhythm.  Exam reveals no gallop and no friction rub.   No murmur heard. Respiratory: Effort normal and breath sounds normal. No respiratory distress. She has no wheezes.  GI: Soft. Bowel sounds are normal. She exhibits no distension. There is no tenderness. There is no guarding.  Musculoskeletal: She exhibits no edema.  Neurological: She is alert.  Patient is oriented to person, place and date of birth. She does exhibit some decreased attention and limited awareness of her deficits. Follows simple commands. Left upper and lower extremity limb ataxia. No obvious sensory deficits to LT or PP. Motor 5/5 RUE and RLE. LUE and LLE 4+/5 prox  to distal. Able to stand from seated position but lost balance quickly to the left when we attempted steps. .  Skin: Skin is warm. No erythema.  Psychiatric: She has a normal mood and affect. Her behavior is normal. Judgment and thought content normal.  Motor: 4+/5 throughout DTRs  symmetric Sensation intact to light touch Skin: Skin is warmand dry.  Psychiatric: She has a normal mood and affect. Her behavior is normal   Lab Results Last 48 Hours        Results for orders placed or performed during the hospital encounter of 06/27/16 (from the past 48 hour(s))  Glucose, capillary     Status: Abnormal   Collection Time: 06/29/16  7:54 AM  Result Value Ref Range   Glucose-Capillary 106 (H) 65 - 99 mg/dL  Glucose, capillary     Status: Abnormal   Collection Time: 06/29/16 12:06 PM  Result Value Ref Range   Glucose-Capillary 124 (H) 65 - 99 mg/dL  Glucose, capillary     Status: Abnormal   Collection Time: 06/29/16  9:33 PM  Result Value Ref Range   Glucose-Capillary 147 (H) 65 - 99 mg/dL   Comment 1 Notify RN    Comment 2 Document in Chart   Glucose, capillary     Status: None   Collection Time: 06/30/16  6:15 AM  Result Value Ref Range   Glucose-Capillary 93 65 - 99 mg/dL   Comment 1 Notify RN    Comment 2 Document in Chart   Basic metabolic panel     Status: Abnormal   Collection Time: 06/30/16  7:06 AM  Result Value Ref Range   Sodium 138 135 - 145 mmol/L   Potassium 4.1 3.5 - 5.1 mmol/L   Chloride 106 101 - 111 mmol/L   CO2 25 22 - 32 mmol/L   Glucose, Bld 100 (H) 65 - 99 mg/dL   BUN 21 (H) 6 - 20 mg/dL   Creatinine, Ser 1.00 0.44 - 1.00 mg/dL   Calcium 9.0 8.9 - 10.3 mg/dL   GFR calc non Af Amer 52 (L) >60 mL/min   GFR calc Af Amer >60 >60 mL/min    Comment: (NOTE) The eGFR has been calculated using the CKD EPI equation. This calculation has not been validated in all clinical situations. eGFR's persistently <60 mL/min signify possible Chronic Kidney Disease.   Anion gap 7 5 - 15  Glucose, capillary     Status: None   Collection Time: 06/30/16 11:18 AM  Result Value Ref Range   Glucose-Capillary 89 65 - 99 mg/dL  Glucose, capillary     Status: None   Collection Time: 06/30/16  4:43 PM  Result Value Ref  Range   Glucose-Capillary 86 65 - 99 mg/dL  Glucose, capillary     Status: Abnormal   Collection Time: 06/30/16  9:36 PM  Result Value Ref Range   Glucose-Capillary 122 (H) 65 - 99 mg/dL   Comment 1 Notify RN    Comment 2 Document in Chart       Imaging Results (Last 48 hours)  Ct Head Wo Contrast  Result Date: 06/29/2016 CLINICAL DATA:  81 year old female with small right thalamic hemorrhage discovered on 06/27/2016 following presentation of dizziness lower extremity weakness and unsteady gait. Initial encounter. EXAM: CT HEAD WITHOUT CONTRAST TECHNIQUE: Contiguous axial images were obtained from the base of the skull through the vertex without intravenous contrast. COMPARISON:  Head CT without contrast 06/27/2016 and earlier. FINDINGS: Brain: Small focus of hyperdense hemorrhage  centered along the inferior right thalamus at the confluence with the right cerebral peduncle is stable encompassing 15 x 12 x 9 mm (estimated blood volume 1 mL). Mild surrounding edema without significant regional mass effect. Superimposed confluent bilateral cerebral white matter hypodensity and heterogeneity in the other deep gray matter nuclei. 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 No extra-axial hemorrhage. No ventriculomegaly. No superimposed acute cortically based infarct. Vascular: Calcified atherosclerosis at the skull base. Generalized intracranial artery dolichoectasia. Skull: No acute osseous abnormality identified. Sinuses/Orbits: Acute on chronic appearing sphenoid sinusitis has not significantly changed. Other Visualized paranasal sinuses and mastoids are stable and well pneumatized. Other: No acute orbit or scalp soft tissue finding. IMPRESSION: 1. Stable small volume intra-axial hemorrhage at the caudal right thalamus/right cerebral peduncle. Mild surrounding edema with no significant regional mass effect. 2. No new intracranial abnormality. Underlying chronic small vessel disease.  3. Acute on chronic sphenoid sinusitis. Electronically Signed   By: Genevie Ann M.D.   On: 06/29/2016 13:19        Medical Problem List and Plan: 1.  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  DVT Prophylaxis/Anticoagulation: SCDs. Patient is ambulatory 3. Pain Management: Tylenol as needed for pain control 4. Hypertension. Norvasc 5 mg daily, Avapro 150 mg daily. Monitor with increased mobility in therapy             -explore affordable options for BP meds at discharge (pt had stopped taking meds due to high cost prior to hemorrhage) 5. Neuropsych: This patient is capable of making decisions on her own behalf. 6. Skin/Wound Care: Routine skin checks 7. Fluids/Electrolytes/Nutrition: Routine I&O with follow-up chemistries upon admit             -encourage PO 8. Diabetes mellitus. Hemoglobin A1c 6.0. Patient diet control prior to admission. Check blood sugars before meals and at bedtime 9.CRI. Creatinine 1.07 on admission. Follow-up chemistries upon admit    Post Admission Physician Evaluation: 1. Functional deficits secondary  to right thalamic hemorrhage. 2. Patient is admitted to receive collaborative, interdisciplinary care between the physiatrist, rehab nursing staff, and therapy team. 3. Patient's level of medical complexity and substantial therapy needs in context of that medical necessity cannot be provided at a lesser intensity of care such as a SNF. 4. Patient has experienced substantial functional loss from his/her baseline which was documented above under the "Functional History" and "Functional Status" headings.  Judging by the patient's diagnosis, physical exam, and functional history, the patient has potential for functional progress which will result in measurable gains while on inpatient rehab.  These gains will be of substantial and practical use upon discharge   in facilitating mobility and self-care at the household level. 5. Physiatrist will provide 24 hour management of medical needs as well as oversight of the therapy plan/treatment and provide guidance as appropriate regarding the interaction of the two. 6. The Preadmission Screening has been reviewed and patient status is unchanged unless otherwise stated above. 7. 24 hour rehab nursing will assist with bladder management, bowel management, safety, skin/wound care, disease management, medication administration and patient education  and help integrate therapy concepts, techniques,education, etc. 8. PT will assess and treat for/with: Lower extremity strength, range of motion, stamina, balance, functional mobility, safety, adaptive techniques and equipment, NMR, visual-perceptual awareness.   Goals are: mod I. 9. OT will assess and treat  for/with: ADL's, functional mobility, safety, upper extremity strength, adaptive techniques and equipment, NMR, visual-perceptual awareness, community reintegration.   Goals are: mod I. Therapy may proceed with showering this patient. 10. SLP will assess and treat for/with: n/a.  Goals are: n/a. 11. Case Management and Social Worker will assess and treat for psychological issues and discharge planning. 12. Team conference will be held weekly to assess progress toward goals and to determine barriers to discharge. 13. Patient will receive at least 3 hours of therapy per day at least 5 days per week. 14. ELOS: 7 days       15. Prognosis:  excellent     Meredith Staggers, MD, [facility] 07/01/2016

## 2016-07-01 NOTE — Progress Notes (Signed)
Patient arived from [place], [facility]. Assigned to room 4M01

## 2016-07-01 NOTE — Discharge Summary (Signed)
Stroke Discharge Summary  Patient ID: Anna Hoffman   MRN: [id number]      DOB: [birth year]  Date of Admission: 06/27/2016 Date of Discharge: 07/01/2016  Attending Physician:  Garvin Fila, MD, Stroke MD Consulting Physician(s):   Delice Lesch, MD (Physical Medicine & Rehabtilitation) Patient's PCP:  Maximino Greenland, MD  Discharge Diagnoses:  Principal Problem:   ICH (intracerebral hemorrhage) ([place]) - hypertensive R thalamic hemorrhage  Active Problems:   Hypertension   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[place])   Benign essential HTN   Diabetes mellitus type 2 in nonobese [facility])   Mixed hyperlipidemia   Hypertensive emergency  Past Medical History:  Diagnosis Date  . Diabetes mellitus without complication ([place])   . Hypertension   . Hypokalemia    Past Surgical History:  Procedure Laterality Date  . ABDOMINAL HYSTERECTOMY    . ESOPHAGOGASTRODUODENOSCOPY N/A 09/01/2014   Procedure: ESOPHAGOGASTRODUODENOSCOPY (EGD);  Surgeon: Lear Ng, MD;  Location: [facility];  Service: Endoscopy;  Laterality: N/A;  . HIATAL HERNIA REPAIR N/A 09/04/2014   Procedure: LAPAROSCOPIC REPAIR OF HIATAL HERNIA;  Surgeon: Excell Seltzer, MD;  Location: [facility];  Service: General;  Laterality: N/A;  With MESH  . IR GENERIC HISTORICAL  04/10/2016   IR US GUIDE VASC ACCESS RIGHT 04/10/2016 Corrie Mckusick, DO [facility]  . IR GENERIC HISTORICAL  04/10/2016   IR ANGIOGRAM SELECTIVE EACH ADDITIONAL VESSEL 04/10/2016 Corrie Mckusick, DO [facility]  . IR GENERIC HISTORICAL  04/10/2016   IR ANGIOGRAM SELECTIVE EACH ADDITIONAL VESSEL 04/10/2016 Corrie Mckusick, DO [facility]  . IR GENERIC HISTORICAL  04/10/2016   IR EMBO TUMOR ORGAN ISCHEMIA INFARCT INC GUIDE ROADMAPPING 04/10/2016 Corrie Mckusick, DO [facility]  . IR GENERIC HISTORICAL  04/10/2016   IR ANGIOGRAM SELECTIVE EACH ADDITIONAL VESSEL 04/10/2016 Corrie Mckusick, DO [facility]  . IR GENERIC HISTORICAL  04/10/2016   IR ANGIOGRAM SELECTIVE EACH ADDITIONAL VESSEL  04/10/2016 Corrie Mckusick, DO [facility]  . IR GENERIC HISTORICAL  04/10/2016   IR RENAL SELECTIVE  UNI INC S&I MOD SED 04/10/2016 Corrie Mckusick, DO [facility]  . KNEE SURGERY      Medications to be continued on Rehab . amLODipine  5 mg Oral Daily  . irbesartan  150 mg Oral Daily  . loratadine  10 mg Oral Daily  . pantoprazole  40 mg Oral QHS  . rosuvastatin  10 mg Oral Daily    LABORATORY STUDIES CBC    Component Value Date/Time   WBC 8.4 06/27/2016 1853   RBC 4.18 06/27/2016 1853   HGB 13.9 06/27/2016 1907   HCT 41.0 06/27/2016 1907   PLT 400 06/27/2016 1853   MCV 92.6 06/27/2016 1853   MCH 30.4 06/27/2016 1853   MCHC 32.8 06/27/2016 1853   RDW 14.5 06/27/2016 1853   LYMPHSABS 2.9 06/27/2016 1853   MONOABS 0.6 06/27/2016 1853   EOSABS 0.2 06/27/2016 1853   BASOSABS 0.0 06/27/2016 1853   CMP    Component Value Date/Time   NA 138 06/30/2016 0706   K 4.1 06/30/2016 0706   CL 106 06/30/2016 0706   CO2 25 06/30/2016 0706   GLUCOSE 100 (H) 06/30/2016 0706   BUN 21 (H) 06/30/2016 0706   CREATININE 1.00 06/30/2016 0706   CALCIUM 9.0 06/30/2016 0706   PROT 7.2 06/27/2016 1853   ALBUMIN 4.4 06/27/2016 1853   AST 25 06/27/2016 1853   ALT 22 06/27/2016 1853   ALKPHOS 73 06/27/2016 1853   BILITOT 0.7  06/27/2016 1853   GFRNONAA 52 (L) 06/30/2016 0706   GFRAA >60 06/30/2016 0706   COAGS Lab Results  Component Value Date   INR 0.93 06/27/2016   INR 1.02 04/10/2016   INR 1.17 02/05/2016   Lipid Panel    Component Value Date/Time   CHOL 224 (H) 06/29/2016 0330   TRIG 180 (H) 06/29/2016 0330   HDL 64 06/29/2016 0330   CHOLHDL 3.5 06/29/2016 0330   VLDL 36 06/29/2016 0330   LDLCALC 124 (H) 06/29/2016 0330   HgbA1C  Lab Results  Component Value Date   HGBA1C 6.0 (H) 06/29/2016   Urinalysis    Component Value Date/Time   COLORURINE YELLOW 02/04/2016 1459   APPEARANCEUR CLEAR 02/04/2016 1459   LABSPEC 1.019 02/04/2016 1459   PHURINE 5.5 02/04/2016 1459    GLUCOSEU NEGATIVE 02/04/2016 1459   HGBUR NEGATIVE 02/04/2016 1459   BILIRUBINUR NEGATIVE 02/04/2016 1459   KETONESUR NEGATIVE 02/04/2016 1459   PROTEINUR 30 (A) 02/04/2016 1459   UROBILINOGEN 0.2 09/01/2014 0300   NITRITE NEGATIVE 02/04/2016 1459   LEUKOCYTESUR TRACE (A) 02/04/2016 1459    SIGNIFICANT DIAGNOSTIC STUDIES Ct Head Wo Contrast 06/27/2016 1. Acute hemorrhage within the right thalamus, measuring 1.7 x 1 cm, with mild surrounding edema.              No significant mass effect. No other acute findings.  2. Chronic small vessel ischemic changes in the white matter.  3. Sphenoid sinus disease, of uncertain age.   Ct Head Wo Contrast 06/29/2016 1. Stable small volume intra-axial hemorrhage at the caudal right thalamus/right cerebral peduncle. Mild surrounding edema with no significant regional mass effect. 2. No new intracranial abnormality. Underlying chronic small vessel disease. 3. Acute on chronic sphenoid sinusitis.      HISTORY OF PRESENT ILLNESS [name] a 81 y.o.femalewho was in her normal state of health until around 4:30PM. She states that she had sudden onset of dizziness this, unsteadiness, headache that started at this time. She continued to have these problems and presented to the [facility] long emergency room where CT scan was performed which demonstrates a thalamic hemorrhage. She was hypertensive and therefore started on IV Cleviprex and then transferred to [facility] neuro ICU for further management. The patient had run out of her home blood pressure medications. She denies any recent trauma, denies taking any blood thinners. She was LKW at 4:30 PM 06/27/2016. ICH Score: 0.    HOSPITAL COURSE Ms. [name] is a 81 y.o. female with history of hypertension, hyperlipidemia, diabetes mellitus, left renal artery  embolization of angiomyolipoma, and hypokalemia presenting with elevated blood pressure, dizziness, unsteadiness, and headache.  She did not receive  IV t-PA due to [place].  Right thalamic hemorrhage:  Non-dominant - believed secondary to hypertension s/p medication noncompliance.   Head CT -  Acute hemorrhage within the right thalamus   Repeat hmg CT stable  2D Echo - 02/06/2016 - EF 65-70%.  LDL - 124  HgbA1c - 6.0  No antithrombotic prior to admission, now on No antithrombotic secondary to thalamic hemorrhage.  Ongoing aggressive stroke risk factor management - patient advised to get and take medications. She is agreeable and has a plan  Therapy recommendations: CIR  Disposition: CIR  Hypertensive Emergency Essential Hypertension  Stable - on oral HTN meds  Initially treated with cleviprex   Hypertension goal < 180 goal  Resumed home blood pressure medications (hold HCTZ for now secondary to renal insufficiency)  Long-term BP goal  normotensive  Hyperlipidemia  Home meds:  Crestor 10 mg daily  - not resumed in hospital secondary to hemorrhage.   LDL 124, goal < 70  Resume statin at discharge  Other Stroke Risk Factors  Advanced age  Other Active Problems  Renal insufficiency - BUN 24 ; creatinine 1.10   DISCHARGE EXAM Blood pressure (!) 161/78, pulse 76, temperature 97.9 F (36.6 C), temperature source Oral, resp. rate 20, height 5\' 5"  (1.651 m), weight 53.1 kg (117 lb), SpO2 98 %. Constitutional: Appears well-developed and well-nourished.  Psych: Affect appropriate to situation Eyes: No scleral injection HENT: No OP obstrucion Head: Normocephalic.  Cardiovascular: Normal rate and regular rhythm.  Respiratory: Effort normal and breath sounds normal to anterior ascultation GI: Soft. No distension. There is no tenderness.  Skin: WDI Neuro: Mental Status: Patient is awake, alert, oriented to person, place, month, year, and situation. Patient is able to give a clear and coherent history. No signs of aphasia or neglect Cranial Nerves: II: Visual Fields are full. Pupils are equal, round, and  reactive to light.  III,IV, VI: EOMI without ptosis or diploplia.  V: Facial sensation is symmetric to temperature VII: Facial movement is Notable for mild left facial weakness VIII: hearing is intact to voice X: Uvula elevates symmetrically XI: Shoulder shrug is symmetric. XII: tongue is midline without atrophy or fasciculations.  Motor: Tone is normal. Bulk is normal. 5/5 strength was present in all four extremities.  Sensory: Sensation is symmetric to light touch and temperature in the arms and legs. Cerebellar: FNF and HKS are intact bilaterally  Discharge Diet  Diet regular Room service appropriate? Yes; Fluid consistency: Thin liquids  DISCHARGE PLAN  Disposition:  Transfer to [place] for ongoing PT, OT and ST  Avoid antiplatelets at this time  Recommend ongoing risk factor control by Primary Care Physician at time of discharge from inpatient rehabilitation.  Follow-up SANDERS,ROBYN N, MD in 2 weeks following discharge from rehab.  Follow-up with Dr. Antony Contras, Stroke Clinic in 6 weeks, office to schedule an appointment.  45 minutes were spent preparing discharge.  Grant-Valkaria Delevan for Pager information 07/01/2016 1:20 PM   I have personally examined this patient, reviewed notes, independently viewed imaging studies, participated in medical decision making and plan of care.ROS completed by me personally and pertinent positives fully documented  I have made any additions or clarifications directly to the above note. Agree with note above.  Antony Contras, MD Medical Director [facility] Pager: [phone] 07/01/2016 1:24 PM

## 2016-07-01 NOTE — Care Management Note (Signed)
Case Management Note  Patient Details  Name: SHAHEEN STAR MRN: [id number] Date of Birth: [birth year]  Subjective/Objective:                    Action/Plan: Pt discharging to CIR today. No further needs per CM.   Expected Discharge Date:                  Expected Discharge Plan:  [facility]  In-House Referral:     Discharge planning Services  CM Consult  Post Acute Care Choice:    Choice offered to:     DME Arranged:    DME Agency:     HH Arranged:    [place] Agency:     Status of Service:  Completed, signed off  If discussed at [employer] of Stay Meetings, dates discussed:    Additional Comments:  Pollie Friar, RN 07/01/2016, 11:36 AM

## 2016-07-01 NOTE — H&P (Signed)
Physical Medicine and Rehabilitation Admission H&P    Chief Complaint  Patient presents with  . Dizziness  . Gait Problem  : HPI: Anna Hoffman is a 81 y.o. right handed female with history of hypertension, NIDDM. Per chart review patient lives with son. 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 Son works during the day. Presented 06/27/2016 with sudden onset of dizziness, headache and unsteady gait. Blood pressure 185/102. CT of the head showed acute hemorrhage within the right thalamus measuring 1.7 x 1 cm with surrounding edema. No significant mass effect. Chronic small vessel ischemic changes in the white matter. Placed on intravenous cleviprex. Neurology follow-up conservative care provided monitoring of blood pressure. Follow-up cranial CT scans stable with no new intracranial abnormality as compared to prior study. Patient did not receive TPA due to [place]. Tolerating a regular diet. Creatinine on admission 1.07 and monitored. Physical and occupational therapy evaluations completed with recommendations of physical medicine rehabilitation consult.Patient was admitted for comprehensive rehabilitation program  Review of Systems  Constitutional: Negative for chills, fever and weight loss.  HENT: Negative for ear pain, hearing loss and tinnitus.   Eyes: Negative for blurred vision and double vision.  Respiratory: Negative for cough and shortness of breath.   Cardiovascular: Negative for chest pain, palpitations, orthopnea and leg swelling.  Gastrointestinal: Positive for constipation. Negative for nausea and vomiting.  Genitourinary: Negative for dysuria, flank pain and hematuria.  Musculoskeletal: Positive for joint pain and myalgias.  Skin: Negative for rash.  Neurological: Positive for dizziness and headaches. Negative for tremors, seizures and loss of consciousness.  Endo/Heme/Allergies: Negative for polydipsia.  All  other systems reviewed and are negative.  Past Medical History:  Diagnosis Date  . Diabetes mellitus without complication ([place])   . Hypertension   . Hypokalemia    Past Surgical History:  Procedure Laterality Date  . ABDOMINAL HYSTERECTOMY    . ESOPHAGOGASTRODUODENOSCOPY N/A 09/01/2014   Procedure: ESOPHAGOGASTRODUODENOSCOPY (EGD);  Surgeon: Lear Ng, MD;  Location: [facility];  Service: Endoscopy;  Laterality: N/A;  . HIATAL HERNIA REPAIR N/A 09/04/2014   Procedure: LAPAROSCOPIC REPAIR OF HIATAL HERNIA;  Surgeon: Excell Seltzer, MD;  Location: [facility];  Service: General;  Laterality: N/A;  With MESH  . IR GENERIC HISTORICAL  04/10/2016   IR US GUIDE VASC ACCESS RIGHT 04/10/2016 Corrie Mckusick, DO [facility]  . IR GENERIC HISTORICAL  04/10/2016   IR ANGIOGRAM SELECTIVE EACH ADDITIONAL VESSEL 04/10/2016 Corrie Mckusick, DO [facility]  . IR GENERIC HISTORICAL  04/10/2016   IR ANGIOGRAM SELECTIVE EACH ADDITIONAL VESSEL 04/10/2016 Corrie Mckusick, DO [facility]  . IR GENERIC HISTORICAL  04/10/2016   IR EMBO TUMOR ORGAN ISCHEMIA INFARCT INC GUIDE ROADMAPPING 04/10/2016 Corrie Mckusick, DO [facility]  . IR GENERIC HISTORICAL  04/10/2016   IR ANGIOGRAM SELECTIVE EACH ADDITIONAL VESSEL 04/10/2016 Corrie Mckusick, DO [facility]  . IR GENERIC HISTORICAL  04/10/2016   IR ANGIOGRAM SELECTIVE EACH ADDITIONAL VESSEL 04/10/2016 Corrie Mckusick, DO [facility]  . IR GENERIC HISTORICAL  04/10/2016   IR RENAL SELECTIVE  UNI INC S&I MOD SED 04/10/2016 Corrie Mckusick, DO [facility]  . KNEE SURGERY     Family History  Problem Relation Age of Onset  . Prostate cancer Father    Social History:  reports that she has never smoked. She has never used smokeless tobacco. She reports that she does not drink alcohol or use drugs.  Allergies:  Allergies  Allergen Reactions  . Amoxicillin Diarrhea    Has patient had a PCN reaction causing immediate rash, facial/tongue/throat swelling, SOB or lightheadedness with  hypotension: no Has patient had a PCN reaction causing severe rash involving mucus membranes or skin necrosis: no Has patient had a PCN reaction that required hospitalization: no pharmacist consult Has patient had a PCN reaction occurring within the last 10 years: yes If all of the above answers are "NO", then may proceed with Cephalosporin use.   . Ampicillin Rash    Has patient had a PCN reaction causing immediate rash, facial/tongue/throat swelling, SOB or lightheadedness with hypotension: No Has patient had a PCN reaction causing severe rash involving mucus membranes or skin necrosis: No Has patient had a PCN reaction that required hospitalization No Has patient had a PCN reaction occurring within the last 10 years: No If all of the above answers are "NO", then may proceed with Cephalosporin use.   . Sulfa Antibiotics Rash   Medications Prior to Admission  Medication Sig Dispense Refill  . acetaminophen (TYLENOL) 325 MG tablet Take 2 tablets (650 mg total) by mouth every 6 (six) hours as needed for mild pain (or Fever >/= 101).    . Ascorbic Acid (VITAMIN C) 1000 MG tablet Take 1,000 mg by mouth daily.    . fexofenadine (ALLEGRA) 180 MG tablet Take 180 mg by mouth daily.    . Multiple Vitamins-Minerals (STRESS TAB NF PO) Take 1 tablet by mouth daily.    . NON FORMULARY Take 1 tablet by mouth daily. Magnesium and D3    . Olmesartan-Amlodipine-HCTZ (TRIBENZOR) 20-5-12.5 MG TABS Take 1 tablet by mouth daily.    Marland Kitchen omeprazole (PRILOSEC) 40 MG capsule Take 40 mg by mouth daily.    . rosuvastatin (CRESTOR) 10 MG tablet Take 10 mg by mouth daily.    . vitamin B-12 (CYANOCOBALAMIN) 1000 MCG tablet Take 1,000 mcg by mouth daily.    . methocarbamol (ROBAXIN) 500 MG tablet Take 1 tablet (500 mg total) by mouth 3 (three) times daily between meals as needed. (Patient not taking: Reported on 06/27/2016) 20 tablet 0  . naproxen (NAPROSYN) 500 MG tablet Take 1 tablet (500 mg total) by mouth 2 (two)  times daily. (Patient not taking: Reported on 06/27/2016) 30 tablet 0    Home: [place] expects to be discharged to:: Private residence Living Arrangements: Children Available Help at Discharge: Family, Available PRN/intermittently, Friend(s) Type of Home: House Home Access: Stairs to enter [employer] of Steps: 2 Entrance Stairs-Rails: Right, Left, Can reach both Home Layout: One level Bathroom Shower/Tub: Tub/shower unit, Primary school teacher: Yes Home Equipment: None  Lives With: Son   Functional History: Prior Function Level of Independence: Independent Comments: drives, has her own business (Control and instrumentation engineer), 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  Functional Status:  Mobility: [place] bed mobility: Modified Independent Transfers Overall transfer level: Needs assistance Equipment used: 1 person hand held assist, None Transfers: Sit to/from Stand Sit to Stand: Min assist, Min guard General transfer comment: x 3; pt unsteady with lean to her left; pt reaching for UE support Ambulation/Gait Ambulation/Gait assistance: Mod assist Ambulation Distance (Feet): 55 Feet (standing rest (lean on wall); 55) Assistive device: None Gait Pattern/deviations: Step-through pattern, Decreased stride length, Decreased weight shift to right, Decreased dorsiflexion - left, Staggering left, Drifts right/left General Gait Details: consistently drifting to her left, yet she denies; given visual cues to walk down middle of  hall "on black tiles" and pt with drift to Lt with 2-3 steps (repeated x 4 with pt ultimately aware of Lt drift); planned failure to allow incr LOB with pt falling towards her left during gait and required mod assist to recover by the time she acknowledged she was losing her balance Gait velocity: decr Gait velocity interpretation: at or above normal speed for age/gender    ADL: ADL Overall ADL's : Needs  assistance/impaired Grooming: Wash/dry hands, Minimal assistance, Standing Grooming Details (indicate cue type and reason): pt leaning heavily on wall on L side with lack of awareness to L knee buckle Lower Body Dressing: Minimal assistance Lower Body Dressing Details (indicate cue type and reason): able to don shoe with incr time. pt reports its much harder than normal. pt required x4 trials to complete task Toilet Transfer: Moderate assistance Toilet Transfer Details (indicate cue type and reason): hand held (A) with L Le dragging. pt when asked to incr step length with more of a MARCHing style step. pt unable to just swing the L LE in a normal pattern Toileting- Clothing Manipulation and Hygiene: Min guard Toileting - Clothing Manipulation Details (indicate cue type and reason): sitting but required (A) to get toilet patper Functional mobility during ADLs: Moderate assistance General ADL Comments: pt heavily leaning on therapist with L side with transfer hand held (A)   Cognition: Cognition Overall Cognitive Status: Impaired/Different from baseline Arousal/Alertness: Awake/alert Orientation Level: Oriented X4 Attention: Alternating Sustained Attention: Appears intact Alternating Attention: Impaired Alternating Attention Impairment: Verbal basic Memory: Appears intact Awareness: Impaired Awareness Impairment: Emergent impairment, Anticipatory impairment Problem Solving: Impaired Problem Solving Impairment: Functional basic, Functional complex Safety/Judgment: Impaired Cognition Arousal/Alertness: Awake/alert Behavior During Therapy: WFL for tasks assessed/performed Overall Cognitive Status: Impaired/Different from baseline Area of Impairment: Attention, Memory, Safety/judgement, Awareness Current Attention Level: Sustained (easily distracted with more than one person in room) Memory: Decreased short-term memory (did not recall any previous PT sessions) Safety/Judgement: Decreased  awareness of safety, Decreased awareness of deficits Awareness:  (pre-intellectual) General Comments: poor awareness of deficits (denied changes post-stroke); Lt inattention and unaware when leaning/losing balance to her left  Physical Exam: Blood pressure (!) 158/83, pulse 76, temperature 98.1 F (36.7 C), temperature source Oral, resp. rate 18, height '5\' 5"'  (1.651 m), weight 53.1 kg (117 lb), SpO2 97 %. Physical Exam  Vitals reviewed. Constitutional: She appears well-developed and well-nourished.  HENT:  Head: Normocephalic.  Right Ear: External ear normal.  Left Ear: External ear normal.  Mouth/Throat: Oropharynx is clear and moist.  Eyes: EOM are normal. Pupils are equal, round, and reactive to light. No scleral icterus.  Neck: Normal range of motion. Neck supple. No JVD present. No tracheal deviation present. No thyromegaly present.  Cardiovascular: Normal rate and regular rhythm.  Exam reveals no gallop and no friction rub.   No murmur heard. Respiratory: Effort normal and breath sounds normal. No respiratory distress. She has no wheezes.  GI: Soft. Bowel sounds are normal. She exhibits no distension. There is no tenderness. There is no guarding.  Musculoskeletal: She exhibits no edema.  Neurological: She is alert.  Patient is oriented to person, place and date of birth. She does exhibit some decreased attention and limited awareness of her deficits. Follows simple commands. Left upper and lower extremity limb ataxia. No obvious sensory deficits to LT or PP. Motor 5/5 RUE and RLE. LUE and LLE 4+/5 prox to distal. Able to stand from seated position but lost balance quickly to the left when we  attempted steps. .  Skin: Skin is warm. No erythema.  Psychiatric: She has a normal mood and affect. Her behavior is normal. Judgment and thought content normal.  Motor: 4+/5 throughout DTRs symmetric Sensation intact to light touch  Skin: Skin is warm and dry.  Psychiatric: She has a normal  mood and affect. Her behavior is normal   Results for orders placed or performed during the hospital encounter of 06/27/16 (from the past 48 hour(s))  Glucose, capillary     Status: Abnormal   Collection Time: 06/29/16  7:54 AM  Result Value Ref Range   Glucose-Capillary 106 (H) 65 - 99 mg/dL  Glucose, capillary     Status: Abnormal   Collection Time: 06/29/16 12:06 PM  Result Value Ref Range   Glucose-Capillary 124 (H) 65 - 99 mg/dL  Glucose, capillary     Status: Abnormal   Collection Time: 06/29/16  9:33 PM  Result Value Ref Range   Glucose-Capillary 147 (H) 65 - 99 mg/dL   Comment 1 Notify RN    Comment 2 Document in Chart   Glucose, capillary     Status: None   Collection Time: 06/30/16  6:15 AM  Result Value Ref Range   Glucose-Capillary 93 65 - 99 mg/dL   Comment 1 Notify RN    Comment 2 Document in Chart   Basic metabolic panel     Status: Abnormal   Collection Time: 06/30/16  7:06 AM  Result Value Ref Range   Sodium 138 135 - 145 mmol/L   Potassium 4.1 3.5 - 5.1 mmol/L   Chloride 106 101 - 111 mmol/L   CO2 25 22 - 32 mmol/L   Glucose, Bld 100 (H) 65 - 99 mg/dL   BUN 21 (H) 6 - 20 mg/dL   Creatinine, Ser 1.00 0.44 - 1.00 mg/dL   Calcium 9.0 8.9 - 10.3 mg/dL   GFR calc non Af Amer 52 (L) >60 mL/min   GFR calc Af Amer >60 >60 mL/min    Comment: (NOTE) The eGFR has been calculated using the CKD EPI equation. This calculation has not been validated in all clinical situations. eGFR's persistently <60 mL/min signify possible Chronic Kidney Disease.    Anion gap 7 5 - 15  Glucose, capillary     Status: None   Collection Time: 06/30/16 11:18 AM  Result Value Ref Range   Glucose-Capillary 89 65 - 99 mg/dL  Glucose, capillary     Status: None   Collection Time: 06/30/16  4:43 PM  Result Value Ref Range   Glucose-Capillary 86 65 - 99 mg/dL  Glucose, capillary     Status: Abnormal   Collection Time: 06/30/16  9:36 PM  Result Value Ref Range   Glucose-Capillary 122  (H) 65 - 99 mg/dL   Comment 1 Notify RN    Comment 2 Document in Chart    Ct Head Wo Contrast  Result Date: 06/29/2016 CLINICAL DATA:  81 year old female with small right thalamic hemorrhage discovered on 06/27/2016 following presentation of dizziness lower extremity weakness and unsteady gait. Initial encounter. EXAM: CT HEAD WITHOUT CONTRAST TECHNIQUE: Contiguous axial images were obtained from the base of the skull through the vertex without intravenous contrast. COMPARISON:  Head CT without contrast 06/27/2016 and earlier. FINDINGS: Brain: Small focus of hyperdense hemorrhage centered along the inferior right thalamus at the confluence with the right cerebral peduncle is stable encompassing 15 x 12 x 9 mm (estimated blood volume 1 mL). Mild surrounding edema without significant regional mass  effect. Superimposed confluent bilateral cerebral white matter hypodensity and heterogeneity in the other deep gray matter nuclei. 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 No extra-axial hemorrhage. No ventriculomegaly. No superimposed acute cortically based infarct. Vascular: Calcified atherosclerosis at the skull base. Generalized intracranial artery dolichoectasia. Skull: No acute osseous abnormality identified. Sinuses/Orbits: Acute on chronic appearing sphenoid sinusitis has not significantly changed. Other Visualized paranasal sinuses and mastoids are stable and well pneumatized. Other: No acute orbit or scalp soft tissue finding. IMPRESSION: 1. Stable small volume intra-axial hemorrhage at the caudal right thalamus/right cerebral peduncle. Mild surrounding edema with no significant regional mass effect. 2. No new intracranial abnormality. Underlying chronic small vessel disease. 3. Acute on chronic sphenoid sinusitis. Electronically Signed   By: Genevie Ann M.D.   On: 06/29/2016 13:19       Medical Problem List and Plan: 1.  Dizziness/headache with unsteady gait secondary to right thalamic  hemorrhage secondary to hypertensive crisis  -admit to inpatient rehab 2.  DVT Prophylaxis/Anticoagulation: SCDs. Patient is ambulatory 3. Pain Management: Tylenol as needed for pain control 4. Hypertension. Norvasc 5 mg daily, Avapro 150 mg daily. Monitor with increased mobility in therapy  -explore affordable options for BP meds at discharge (pt had stopped taking meds due to high cost prior to hemorrhage) 5. Neuropsych: This patient is capable of making decisions on her own behalf. 6. Skin/Wound Care: Routine skin checks 7. Fluids/Electrolytes/Nutrition: Routine I&O with follow-up chemistries upon admit  -encourage PO 8. Diabetes mellitus. Hemoglobin A1c 6.0. Patient diet control prior to admission. Check blood sugars before meals and at bedtime 9.CRI. Creatinine 1.07 on admission. Follow-up chemistries upon admit    Post Admission Physician Evaluation: 1. Functional deficits secondary  to right thalamic hemorrhage. 2. Patient is admitted to receive collaborative, interdisciplinary care between the physiatrist, rehab nursing staff, and therapy team. 3. Patient's level of medical complexity and substantial therapy needs in context of that medical necessity cannot be provided at a lesser intensity of care such as a SNF. 4. Patient has experienced substantial functional loss from his/her baseline which was documented above under the "Functional History" and "Functional Status" headings.  Judging by the patient's diagnosis, physical exam, and functional history, the patient has potential for functional progress which will result in measurable gains while on inpatient rehab.  These gains will be of substantial and practical use upon discharge  in facilitating mobility and self-care at the household level. 5. Physiatrist will provide 24 hour management of medical needs as well as oversight of the therapy plan/treatment and provide guidance as appropriate regarding the interaction of the two. 6. The  Preadmission Screening has been reviewed and patient status is unchanged unless otherwise stated above. 7. 24 hour rehab nursing will assist with bladder management, bowel management, safety, skin/wound care, disease management, medication administration and patient education  and help integrate therapy concepts, techniques,education, etc. 8. PT will assess and treat for/with: Lower extremity strength, range of motion, stamina, balance, functional mobility, safety, adaptive techniques and equipment, NMR, visual-perceptual awareness.   Goals are: mod I. 9. OT will assess and treat for/with: ADL's, functional mobility, safety, upper extremity strength, adaptive techniques and equipment, NMR, visual-perceptual awareness, community reintegration.   Goals are: mod I. Therapy may proceed with showering this patient. 10. SLP will assess and treat for/with: n/a.  Goals are: n/a. 11. Case Management and Social Worker will assess and treat for psychological issues and discharge planning. 12. Team conference will be held weekly to assess progress toward  goals and to determine barriers to discharge. 13. Patient will receive at least 3 hours of therapy per day at least 5 days per week. 14. ELOS: 7 days       15. Prognosis:  excellent     Meredith Staggers, MD, [place] [facility] 07/01/2016  07/01/2016

## 2016-07-02 ENCOUNTER — Inpatient Hospital Stay (HOSPITAL_COMMUNITY): Payer: Medicare Other | Admitting: Occupational Therapy

## 2016-07-02 ENCOUNTER — Inpatient Hospital Stay (HOSPITAL_COMMUNITY): Payer: Medicare Other | Admitting: Speech Pathology

## 2016-07-02 ENCOUNTER — Inpatient Hospital Stay (HOSPITAL_COMMUNITY): Payer: Medicare Other | Admitting: Physical Therapy

## 2016-07-02 LAB — GLUCOSE, CAPILLARY
GLUCOSE-CAPILLARY: 95 mg/dL (ref 65–99)
GLUCOSE-CAPILLARY: 94 mg/dL (ref 65–99)
Glucose-Capillary: 65 mg/dL (ref 65–99)
Glucose-Capillary: 95 mg/dL (ref 65–99)

## 2016-07-02 LAB — COMPREHENSIVE METABOLIC PANEL
ALK PHOS: 64 U/L (ref 38–126)
ALT: 20 U/L (ref 14–54)
AST: 20 U/L (ref 15–41)
Albumin: 3.3 g/dL — ABNORMAL LOW (ref 3.5–5.0)
Anion gap: 8 (ref 5–15)
BILIRUBIN TOTAL: 0.4 mg/dL (ref 0.3–1.2)
BUN: 22 mg/dL — AB (ref 6–20)
CALCIUM: 9.4 mg/dL (ref 8.9–10.3)
CO2: 22 mmol/L (ref 22–32)
CREATININE: 1.01 mg/dL — AB (ref 0.44–1.00)
Chloride: 105 mmol/L (ref 101–111)
GFR, EST AFRICAN AMERICAN: 60 mL/min — AB (ref >60)
GFR, EST NON AFRICAN AMERICAN: 52 mL/min — AB (ref >60)
Glucose, Bld: 103 mg/dL — ABNORMAL HIGH (ref 65–99)
Potassium: 4.5 mmol/L (ref 3.5–5.1)
Sodium: 135 mmol/L (ref 135–145)
Total Protein: 5.7 g/dL — ABNORMAL LOW (ref 6.5–8.1)

## 2016-07-02 LAB — CBC WITH DIFFERENTIAL/PLATELET
Basophils Absolute: 0.1 10*3/uL (ref 0.0–0.1)
Basophils Relative: 1 %
EOS PCT: 4 %
Eosinophils Absolute: 0.3 10*3/uL (ref 0.0–0.7)
HEMATOCRIT: 37.4 % (ref 36.0–46.0)
HEMOGLOBIN: 12.4 g/dL (ref 12.0–15.0)
LYMPHS ABS: 2.7 10*3/uL (ref 0.7–4.0)
LYMPHS PCT: 33 %
MCH: 30.2 pg (ref 26.0–34.0)
MCHC: 33.2 g/dL (ref 30.0–36.0)
MCV: 91.2 fL (ref 78.0–100.0)
Monocytes Absolute: 0.5 10*3/uL (ref 0.1–1.0)
Monocytes Relative: 7 %
NEUTROS ABS: 4.6 10*3/uL (ref 1.7–7.7)
NEUTROS PCT: 55 %
Platelets: 359 10*3/uL (ref 150–400)
RBC: 4.1 MIL/uL (ref 3.87–5.11)
RDW: 14.2 % (ref 11.5–15.5)
WBC: 8.1 10*3/uL (ref 4.0–10.5)

## 2016-07-02 MED ORDER — MUSCLE RUB 10-15 % EX CREA
TOPICAL_CREAM | CUTANEOUS | Status: DC | PRN
  Administered 2016-07-02 – 2016-07-03 (×2): via TOPICAL
  Filled 2016-07-02: qty 85
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
## 2016-07-02 NOTE — Evaluation (Addendum)
Speech Language Pathology Assessment and Plan  Patient Details  Name: Anna Hoffman MRN: [id number] Date of Birth: 07/29/1935  SLP Diagnosis: Cognitive Impairments  Rehab Potential: Excellent ELOS: 14-21 days    Today's Date: 07/02/2016 SLP Individual Time: 0902-1010 SLP Individual Time Calculation (min): 68 min    Problem List:  Patient Active Problem List   Diagnosis Date Noted  . Hypertensive emergency 07/01/2016  . Thalamic hemorrhage ([place]) 07/01/2016  . 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 Benign essential HTN   . Diabetes mellitus type 2 in nonobese ([name])   . Mixed hyperlipidemia   . ICH (intracerebral hemorrhage) ([place]) - hypertensive R thalamic hemorrhage  06/27/2016  . 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 Retroperitoneal bleed 02/08/2016  . Abdominal pain   . Protein-calorie malnutrition, severe 02/05/2016  . Gastroenteritis 02/04/2016  . Diarrhea 02/04/2016  . Hypokalemia 02/04/2016  . Incarcerated paraesophageal hernia 09/02/2014  . Renal mass, left 09/02/2014  . Acute esophagitis 09/02/2014  . Gastric outlet obstruction 09/02/2014  . Hypertension   . Vomiting 09/01/2014   Past Medical History:  Past Medical History:  Diagnosis Date  . Diabetes mellitus without complication ([place])   . Hypertension   . Hypokalemia    Past Surgical History:  Past Surgical History:  Procedure Laterality Date  . ABDOMINAL HYSTERECTOMY    . ESOPHAGOGASTRODUODENOSCOPY N/A 09/01/2014   Procedure: ESOPHAGOGASTRODUODENOSCOPY (EGD);  Surgeon: Lear Ng, MD;  Location: [facility];  Service: Endoscopy;  Laterality: N/A;  . HIATAL HERNIA REPAIR N/A 09/04/2014   Procedure: LAPAROSCOPIC REPAIR OF HIATAL HERNIA;  Surgeon: Excell Seltzer, MD;  Location: [facility];  Service: General;  Laterality: N/A;  With MESH  . IR GENERIC HISTORICAL  04/10/2016   IR US GUIDE VASC ACCESS RIGHT 04/10/2016 Corrie Mckusick, DO [facility]  . IR GENERIC HISTORICAL  04/10/2016   IR ANGIOGRAM  SELECTIVE EACH ADDITIONAL VESSEL 04/10/2016 Corrie Mckusick, DO [facility]  . IR GENERIC HISTORICAL  04/10/2016   IR ANGIOGRAM SELECTIVE EACH ADDITIONAL VESSEL 04/10/2016 Corrie Mckusick, DO [facility]  . IR GENERIC HISTORICAL  04/10/2016   IR EMBO TUMOR ORGAN ISCHEMIA INFARCT INC GUIDE ROADMAPPING 04/10/2016 Corrie Mckusick, DO [facility]  . IR GENERIC HISTORICAL  04/10/2016   IR ANGIOGRAM SELECTIVE EACH ADDITIONAL VESSEL 04/10/2016 Corrie Mckusick, DO [facility]  . IR GENERIC HISTORICAL  04/10/2016   IR ANGIOGRAM SELECTIVE EACH ADDITIONAL VESSEL 04/10/2016 Corrie Mckusick, DO [facility]  . IR GENERIC HISTORICAL  04/10/2016   IR RENAL SELECTIVE  UNI INC S&I MOD SED 04/10/2016 Corrie Mckusick, DO [facility]  . KNEE SURGERY      Assessment / Plan / Recommendation Clinical Impression Patient is a 81 y.o.right handed femalewith history of hypertension, NIDDM.Per chart review patient lives with son. 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 Son works during the day.Presented 06/27/2016 with sudden onset of dizziness, headache and unsteady gait. Blood pressure 185/102. CT of the head showed acute hemorrhage within the right thalamus measuring 1.7 x 1 cm with surrounding edema. No significant mass effect. Chronic small vessel ischemic changes in the white matter. Placed on intravenous cleviprex. Neurology follow-up conservative care provided monitoring of blood pressure. Follow-up cranial CT scans stable with no new intracranial abnormality as compared to prior study. Patient did not receive TPA due to [place]. Tolerating a regular diet. Creatinine on admission 1.07 and monitored. Physical and occupationaltherapy evaluationscompleted with recommendations of physical  medicine rehabilitation consult. Patient was admitted for comprehensive rehabilitation program on 07/01/16.   Patient was administered the Assessment of Language-Related Functional Activities  (ALFA) and scored WFL on money management tasks, simple functional problem solving, following multistep directions, use of working memory, short term memory, and reading comprehension. Patient demonstrated deficit in alternating attention. Since the patient is self-employed and accustomed to being mentally active, additional therapy tasks recommended to determine complex problem solving abilities. Patient would benefit from skilled SLP intervention to maximize overall cognitive function and functional independence prior to discharge.    Skilled Therapeutic Interventions          Cognitive-linguistic evaluation administered, please see above for results. Patient educated in regards to goals of skilled SLP intervention and patient verbalized understanding.   SLP Assessment  Patient will need skilled [place] [facility] during CIR admission    Recommendations  Oral Care Recommendations: Oral care BID Patient destination: Home Follow up Recommendations: None Equipment Recommended: None recommended by SLP    SLP Frequency 3 to 5 out of 7 days   SLP Duration  SLP Intensity  SLP Treatment/Interventions 14-21 days  Minumum of 1-2 x/day, 30 to 90 minutes  Cognitive remediation/compensation;Cueing hierarchy;Functional tasks;Environmental controls;Internal/external aids;Patient/family education;Therapeutic Activities    Pain Pain Assessment Pain Assessment: No/denies pain  Prior Functioning Cognitive/Linguistic Baseline: Within functional limits Type of Home: House  Lives With: Son Available Help at Discharge: Family;Friend(s);Available PRN/intermittently Vocation: Self employed  Function:  Cognition Comprehension Comprehension assist level: Understands complex 90% of the time/cues 10% of the time  Expression   Expression assist level: Expresses complex 90% of the time/cues < 10% of the time  Social Interaction Social Interaction assist level: Interacts appropriately  with others - No medications needed.  Problem Solving Problem solving assist level: Solves basic 90% of the time/requires cueing < 10% of the time  Memory Memory assist level: Recognizes or recalls 90% of the time/requires cueing < 10% of the time   Short Term Goals: Week 1: SLP Short Term Goal 1 (Week 1): Given supervision verbal cues, patient will demonstrate alternating attention to functional, daily tasks in a moderately distracting environment. SLP Short Term Goal 2 (Week 1): Patient will problem solve for mildly complex functional tasks given supervision verbal and question cues.   Refer to Care Plan for Long Term Goals  Recommendations for other services: None  Discharge Criteria: Patient will be discharged from SLP if patient refuses treatment 3 consecutive times without medical reason, if treatment goals not met, if there is a change in medical status, if patient makes no progress towards goals or if patient is discharged from hospital.  The above assessment, treatment plan, treatment alternatives and goals were discussed and mutually agreed upon: by patient    I was present for the majority of the abovementioned evaluation and am in agreement with the abovementioned observations and plan of care.   Windell Moulding; MA, CCC-SLP  Thornton Papas 07/02/2016, 2:37 PM

## 2016-07-02 NOTE — Progress Notes (Signed)
Patient information reviewed and entered into eRehab system by Caius Silbernagel, RN, CRRN, PPS Coordinator.  Information including medical coding and functional independence measure will be reviewed and updated through discharge.     Per nursing patient was given "Data Collection Information Summary for Patients in Inpatient Rehabilitation Facilities with attached "Privacy Act Statement-Health Care Records" upon admission.  

## 2016-07-02 NOTE — Evaluation (Signed)
Physical Therapy Assessment and Plan  Patient Details  Name: Anna Hoffman MRN: [id number] Date of Birth: 03-30-1935  PT Diagnosis: Abnormal posture, Abnormality of gait and Muscle weakness Rehab Potential: Excellent ELOS: 10-12   Today's Date: 07/02/2016 PT Individual Time: 3300-7622 PT Individual Time Calculation (min): 70 min     Problem List: Patient Active Problem List   Diagnosis Date Noted  . Hypertensive emergency 07/01/2016  . Thalamic hemorrhage ([place]) 07/01/2016  . 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 Benign essential HTN   . Diabetes mellitus type 2 in nonobese ([name])   . Mixed hyperlipidemia   . ICH (intracerebral hemorrhage) ([place]) - hypertensive R thalamic hemorrhage  06/27/2016  . 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 Retroperitoneal bleed 02/08/2016  . Abdominal pain   . Protein-calorie malnutrition, severe 02/05/2016  . Gastroenteritis 02/04/2016  . Diarrhea 02/04/2016  . Hypokalemia 02/04/2016  . Incarcerated paraesophageal hernia 09/02/2014  . Renal mass, left 09/02/2014  . Acute esophagitis 09/02/2014  . Gastric outlet obstruction 09/02/2014  . Hypertension   . Vomiting 09/01/2014    Past Medical History:  Past Medical History:  Diagnosis Date  . Diabetes mellitus without complication ([place])   . Hypertension   . Hypokalemia    Past Surgical History:  Past Surgical History:  Procedure Laterality Date  . ABDOMINAL HYSTERECTOMY    . ESOPHAGOGASTRODUODENOSCOPY N/A 09/01/2014   Procedure: ESOPHAGOGASTRODUODENOSCOPY (EGD);  Surgeon: Lear Ng, MD;  Location: [facility];  Service: Endoscopy;  Laterality: N/A;  . HIATAL HERNIA REPAIR N/A 09/04/2014   Procedure: LAPAROSCOPIC REPAIR OF HIATAL HERNIA;  Surgeon: Excell Seltzer, MD;  [facility];  Service: General;  Laterality: N/A;  With MESH  . IR GENERIC HISTORICAL  04/10/2016   IR US GUIDE VASC ACCESS RIGHT 04/10/2016 Corrie Mckusick, DO [facility]  . IR GENERIC HISTORICAL  04/10/2016    IR ANGIOGRAM SELECTIVE EACH ADDITIONAL VESSEL 04/10/2016 Corrie Mckusick, DO [facility]  . IR GENERIC HISTORICAL  04/10/2016   IR ANGIOGRAM SELECTIVE EACH ADDITIONAL VESSEL 04/10/2016 Corrie Mckusick, DO [facility]  . IR GENERIC HISTORICAL  04/10/2016   IR EMBO TUMOR ORGAN ISCHEMIA INFARCT INC GUIDE ROADMAPPING 04/10/2016 Corrie Mckusick, DO [facility]  . IR GENERIC HISTORICAL  04/10/2016   IR ANGIOGRAM SELECTIVE EACH ADDITIONAL VESSEL 04/10/2016 Corrie Mckusick, DO [facility]  . IR GENERIC HISTORICAL  04/10/2016   IR ANGIOGRAM SELECTIVE EACH ADDITIONAL VESSEL 04/10/2016 Corrie Mckusick, DO [facility]  . IR GENERIC HISTORICAL  04/10/2016   IR RENAL SELECTIVE  UNI INC S&I MOD SED 04/10/2016 Corrie Mckusick, DO [facility]  . KNEE SURGERY      Assessment & Plan Clinical Impression: Patient is a 81 y.o.right handed femalewith history of hypertension, NIDDM.Per chart review patient lives with son. 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 Son works during the day.Presented 06/27/2016 with sudden onset of dizziness, headache and unsteady gait. Blood pressure 185/102. CT of the head showed acute hemorrhage within the right thalamus measuring 1.7 x 1 cm with surrounding edema. No significant mass effect. Chronic small vessel ischemic changes in the white matter. Placed on intravenous cleviprex. Neurology follow-up conservative care provided monitoring of blood pressure. Follow-up cranial CT scans stable with no new intracranial abnormality as compared to prior study. Patient did not receive TPA due to [place].   Patient transferred to CIR on 07/01/2016 .   Patient currently requires mod with mobility  secondary to muscle weakness, decreased attention and decreased awareness and decreased standing balance, decreased postural control and decreased balance strategies.  Prior to hospitalization, patient was independent  with mobility and lived with Son in a House  home.  Home access is 2Stairs to enter.  Patient will benefit from skilled PT intervention to maximize safe functional mobility, minimize fall risk and decrease caregiver burden for planned discharge home with intermittent assist.  Anticipate patient will benefit from follow up [facility] at discharge.  PT - End of Session Activity Tolerance: Tolerates 30+ min activity with multiple rests Endurance Deficit: Yes PT Assessment Rehab Potential (ACUTE/IP ONLY): Excellent Barriers to Discharge: Decreased caregiver support;Inaccessible home environment PT Patient demonstrates impairments in the following area(s): Balance;Motor;Endurance;Safety;Sensory PT Transfers Functional Problem(s): Bed Mobility;Bed to Chair;Car;Floor;Furniture PT Locomotion Functional Problem(s): Ambulation;Wheelchair Mobility;Stairs PT Plan PT Intensity: Minimum of 1-2 x/day ,45 to 90 minutes PT Frequency: 5 out of 7 days PT Duration Estimated Length of Stay: 10-12 PT Treatment/Interventions: Ambulation/gait training;Balance/vestibular training;Community reintegration;Discharge planning;Disease management/prevention;DME/adaptive equipment instruction;Functional mobility training;Neuromuscular re-education;Patient/family education;Psychosocial support;Splinting/orthotics;Skin care/wound management;Stair training;Therapeutic Activities;Therapeutic Exercise;UE/LE Strength taining/ROM;UE/LE Coordination activities;Visual/perceptual remediation/compensation;Wheelchair propulsion/positioning PT Transfers Anticipated Outcome(s): Mod I with LRAD PT Locomotion Anticipated Outcome(s): Mod I with LRAD  PT Recommendation Follow Up Recommendations: Home health PT Patient destination: Home Equipment Recommended: Rolling walker with 5" wheels       Skilled Therapeutic Intervention pt received sitting EOB with CSW present. PT instructed patient in Evaluation and initiated treatment intervention; see below for results. Pt educated in basic  transfers, gait training with RW and without RW, bed mobility, car transfer with stand pivot technique. Patient required mod assist for gait without RW and min assist with RW. Patient noted to have constant L lateral lean/LOB without AD. Patient noted to have orthostatic symptoms with fast sit<>stand transfers, but not present with decreased speed. Pt returned to room and left sitting in recliner with call bell in reach and all needs ment.  PT Evaluation Precautions/Restrictions Precautions Precautions: Fall General Chart Reviewed: Yes Vital Signs Pain Pain Assessment Pain Assessment: 0-10 Pain Score: 2  Pain Location: Head Pain Intervention(s): Medication (See eMAR) Home Living/Prior Functioning Home Living Available Help at Discharge: Family;Available PRN/intermittently;Friend(s) (son works) Type of Home: House Home Access: Stairs to enter Technical brewer of Steps: 2 Entrance Stairs-Rails: Right;Left;Can reach both [place]: One level Bathroom Shower/Tub: Sports coach: Yes  Lives With: Son Prior Function Level of Independence: Independent with basic ADLs  Able to Take Stairs?: Yes Driving: Yes Vocation: Self employed Comments: drives, has her own business (Control and instrumentation engineer), 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 Vision/Perception  Vision - Assessment Eye Alignment: Impaired (comment) (right eye with slight closing of eye lid noted at times) Ocular Range of Motion: Restricted looking up;Other (comment) (decreased ocular AROM superiorly during testing) Alignment/Gaze Preference: Within Defined Limits Tracking/Visual Pursuits: Decreased smoothness of horizontal tracking;Decreased smoothness of vertical tracking;Requires cues, head turns, or add eye shifts to track;Other (comment) (Pt with overshooting when tracking as well as decreased ability to follow target overall, not specific to any  direction)  Cognition Overall Cognitive Status: Impaired/Different from baseline Arousal/Alertness: Awake/alert Orientation Level: Oriented X4 Attention: Alternating Sustained Attention: Appears intact Selective Attention: Appears intact Alternating Attention: Impaired Alternating Attention Impairment: Verbal complex;Functional complex Divided Attention: Impaired Divided Attention Impairment: Verbal basic;Functional basic Memory: Appears intact Awareness: Appears intact Problem Solving: Impaired Problem Solving Impairment: Verbal complex;Functional complex Safety/Judgment: Appears intact Comments: Pt demonstrated awareness of deficits when questioned.  Very talkative at times and unable to divide attention between selfcare tasks and conversation.   Sensation Sensation Light Touch: Appears Intact Stereognosis: Appears Intact Hot/Cold: Appears Intact Proprioception: Appears Intact Additional Comments: BLE sensation intact Coordination Gross Motor Movements are Fluid and Coordinated: Yes Fine Motor Movements are Fluid and Coordinated: Yes ( ) Motor  Motor Motor: Abnormal postural alignment and control Motor - Skilled Clinical Observations: Increased weightbearing to the left and LOB at times.   Mobility Bed Mobility Bed Mobility: Rolling Right;Rolling Left;Supine to Sit;Sit to Supine Rolling Right: 5: Supervision Rolling Right Details: Verbal cues for technique Rolling Left: 5: Supervision Rolling Left Details: Verbal cues for technique Supine to Sit: 5: Supervision Supine to Sit Details: Verbal cues for technique Sit to Supine: 5: Supervision Sit to Supine - Details: Verbal cues for technique Transfers Transfers: Yes Sit to Stand: 4: Min assist Sit to Stand Details: Verbal cues for technique;Verbal cues for precautions/safety;Tactile cues for weight shifting Stand to Sit: 4: Min assist Stand to Sit Details (indicate cue type and reason): Verbal cues for  precautions/safety;Verbal cues for technique;Tactile cues for weight shifting Stand to Sit Details: Mod instructional cueing to reach back before sitting in order to help control descent.  Stand Pivot Transfers: 4: Min Psychologist, occupational Details: Verbal cues for technique;Verbal cues for precautions/safety;Verbal cues for gait pattern Locomotion  Ambulation Ambulation: Yes Ambulation/Gait Assistance: 3: Mod assist Ambulation Distance (Feet): 120 Feet Assistive device: 1 person hand held assist Gait Gait: Yes Gait Pattern: Impaired Gait Pattern: Decreased step length - left;Poor foot clearance - left;Step-through pattern Stairs / Additional Locomotion Stairs: Yes Stairs Assistance: 4: Min assist Stair Management Technique: Two rails Number of Stairs: 12 Height of Stairs: 6 Wheelchair Mobility Wheelchair Mobility: No  Trunk/Postural Assessment  Cervical Assessment Cervical Assessment: Within Functional Limits Thoracic Assessment Thoracic Assessment: Within Functional Limits Lumbar Assessment Lumbar Assessment: Within Functional Limits Postural Control Postural Control: Deficits on evaluation Protective Responses: Delayed balance reactions resulting in LOB to the left in standing.  Balance Balance Balance Assessed: Yes Dynamic Sitting Balance Dynamic Sitting - Balance Support: Feet supported;During functional activity Dynamic Sitting - Level of Assistance: 5: Stand by assistance Static Standing Balance Static Standing - Balance Support: No upper extremity supported Static Standing - Level of Assistance: 4: Min assist Dynamic Standing Balance Dynamic Standing - Balance Support: During functional activity;No upper extremity supported Dynamic Standing - Level of Assistance: 3: Mod assist Extremity Assessment  RUE Assessment RUE Assessment: Within Functional Limits LUE Assessment LUE Assessment: Within Functional Limits (strength 4/5 in shoulders and elbow, 3+/5 grip  strength) RLE Assessment RLE Assessment: Exceptions to [facility] (4+/5 grossly, proximal to distal. ) LLE Assessment LLE Assessment: Exceptions to [facility] (4/5 grossly except knee extension 4+/5. )   See Function Navigator for Current Functional Status.   Refer to Care Plan for Long Term Goals  Recommendations for other services: None  Discharge Criteria: Patient will be discharged from PT if patient refuses treatment 3 consecutive times without medical reason, if treatment goals not met, if there is a change in medical status, if patient makes no progress towards goals or if patient is discharged from hospital.  The above assessment, treatment plan, treatment alternatives and goals were discussed and mutually agreed upon: by patient  Lorie Phenix 07/02/2016, 2:46 PM

## 2016-07-03 ENCOUNTER — Inpatient Hospital Stay (HOSPITAL_COMMUNITY): Payer: Medicare Other | Admitting: Physical Therapy

## 2016-07-03 ENCOUNTER — Inpatient Hospital Stay (HOSPITAL_COMMUNITY): Payer: Medicare Other | Admitting: Occupational Therapy

## 2016-07-03 ENCOUNTER — Inpatient Hospital Stay (HOSPITAL_COMMUNITY): Payer: Medicare Other | Admitting: Speech Pathology

## 2016-07-03 ENCOUNTER — Inpatient Hospital Stay (HOSPITAL_COMMUNITY): Payer: Medicare Other

## 2016-07-03 LAB — GLUCOSE, CAPILLARY
GLUCOSE-CAPILLARY: 97 mg/dL (ref 65–99)
GLUCOSE-CAPILLARY: 87 mg/dL (ref 65–99)
GLUCOSE-CAPILLARY: 112 mg/dL — AB (ref 65–99)
GLUCOSE-CAPILLARY: 106 mg/dL — AB (ref 65–99)
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
## 2016-07-03 NOTE — Progress Notes (Signed)
Physical Therapy Session Note  Patient Details  Name: Anna Hoffman MRN: [id number] Date of Birth: 1935-03-23  Today's Date: 07/03/2016 PT Individual Time: 1100-1200 PT Individual Time Calculation (min): 60 min    Short Term Goals: Week 1:  PT Short Term Goal 1 (Week 1): Bed mobility with increased time, but no coues or assist.  PT Short Term Goal 2 (Week 1): pt will ambulate 117f with LRAD and Supervision Assist PT Short Term Goal 3 (Week 1): Patient will perform basic transfers with Supervision Assist.  PT Short Term Goal 4 (Week 1): Patient will ascend 4 steps with BUE support and Supervision Assist.   Skilled Therapeutic Interventions/Progress Updates:     Patient received sitting in recliner and agreeable to PT.  Pt instructed in sit<>stand transfers throughout treatment x 15 with supervision assist- min assist due to occasional LOB to the L. Min cues for improved WB through the R LE with sit<>stand to prevent lateral LOB.    Gait training instructed by PT [id number] [id number] min assist from PT with BUE support on RW. Pt required mod verbal and tactile cues for improved weight shifting over the R LE to prevent constant L lateral LOB. As well as decreased force of heel strike on the LLE to improve Weight shift to the R.   PT instructed patient in berg balance test. Patient demonstrates increased fall risk as noted by score of   36/56 on Berg Balance Scale.  (<36= high risk for falls, close to 100%; 37-45 significant >80%; 46-51 moderate >50%; 52-55 lower >25%)  PT instructed patient in standing balance training on airex pad eyes open/eyes closed 3 x 30 seconds each with min-mod assist to prevent L/posterior LOB. Standing balance also performed on 3" wedge 2x 5 minutes to build pipe tree. Mod cues for improved WB through the RLE with use of hip strategy to prevent LOB. Moderate cues also required to refer back to reference to build proper pipe tree shape.   Patient returned to room and left  sitting in recliner with QRbelt in place and all needs met.      Therapy Documentation Precautions:  Precautions Precautions: Fall Precaution Comments: monitor BP (order for SBP <160) Restrictions Weight Bearing Restrictions: No General:   Vital Signs:  Pain: Pain Assessment Pain Assessment: 0-10 Pain Score: 4  Faces Pain Scale: Hurts a little bit Pain Type: Chronic pain Pain Location: Head Pain Descriptors / Indicators: Headache Pain Intervention(s): Repositioned     Balance: Balance Balance Assessed: Yes Standardized Balance Assessment Standardized Balance Assessment: Berg Balance Test Berg Balance Test Sit to Stand: Able to stand  independently using hands Standing Unsupported: Able to stand 2 minutes with supervision Sitting with Back Unsupported but Feet Supported on Floor or Stool: Able to sit safely and securely 2 minutes Stand to Sit: Controls descent by using hands Transfers: Able to transfer safely, definite need of hands Standing Unsupported with Eyes Closed: Able to stand 3 seconds Standing Ubsupported with Feet Together: Able to place feet together independently but unable to hold for 30 seconds From Standing, Reach Forward with Outstretched Arm: Can reach confidently >25 cm (10") From Standing Position, Pick up Object from Floor: Able to pick up shoe, needs supervision From Standing Position, Turn to Look Behind Over each Shoulder: Looks behind one side only/other side shows less weight shift Turn 360 Degrees: Needs close supervision or verbal cueing Standing Unsupported, Alternately Place Feet on Step/Stool: Able to complete >2 steps/needs minimal assist Standing Unsupported,  One Foot in Front: Able to plae foot ahead of the other independently and hold 30 seconds Standing on One Leg: Tries to lift leg/unable to hold 3 seconds but remains standing independently Total Score: 36   See Function Navigator for Current Functional Status.   Therapy/Group:  Individual Therapy  Lorie Phenix 07/03/2016, 6:20 PM
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
## 2016-07-03 NOTE — Progress Notes (Signed)
Occupational Therapy Session Note  Patient Details  Name: Anna Hoffman MRN: [id number] Date of Birth: March 09, 1935  Today's Date: 07/03/2016 OT Individual Time: 6644-0347 OT Individual Time Calculation (min): 59 min     Short Term Goals: Week 1:  OT Short Term Goal 1 (Week 1): Pt will complete all UB bathing in standing with close supervision.  OT Short Term Goal 2 (Week 1): Pt will complete LB bathing sit to stand with supervision. OT Short Term Goal 3 (Week 1): Pt will complete all dressing sit to stand with supervision.  OT Short Term Goal 4 (Week 1): Pt will perform toilet and shower transfers with supervision using RW and appropriate shower/toilet DME.  OT Short Term Goal 5 (Week 1): Pt will complete simple meal prep with supervision using the RW for support.   Skilled Therapeutic Interventions/Progress Updates:    Pt completed shower and dressing this session.  Mod instructional cueing for use of the RW as well as not trying to pull up on the walker when standing or leaving hands on the walker when attempting to sit back down.  She was able to bathe sit to stand.  Still with occasional posterior lean at times, with min instructional cueing for foot placement prior to attempting to stand.  Pt able to complete all aspects of bathing and dressing with overall min guard assist.  She was positioned in bedside chair at end of session with safety belt in place.   Therapy Documentation Precautions:  Precautions Precautions: Fall Precaution Comments: monitor BP (order for SBP <160) Restrictions Weight Bearing Restrictions: No  Pain: Pain Assessment Pain Assessment: No/denies pain ADL: See Function Navigator for Current Functional Status.   Therapy/Group: Individual Therapy  Zaelynn Fuchs OTR/L 07/03/2016, 12:16 PM
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
## 2016-07-03 NOTE — Progress Notes (Signed)
Social Work Patient ID: Anna Hoffman, female   DOB: 09-04-1934, 81 y.o.   MRN: [id number]   Pt was discussed in team conference in the presence of Windell Moulding, Leslie; Clyda Greener, OT; Barrie Folk, PT; Nestor Lewandowsky, RN; Daiva Nakayama, PPS; Alfonse Alpers, CSW; and Dr. Alysia Penna, MD.  Dr. Letta Pate discussed pt is here for a stroke, but he does not anticipate any medical issues or barriers to discharge.  He anticipates that pt will need to stay on CIR for 1 to 2 weeks with mostly modified independent goals.  Team members to complete full evaluations and pt will be discussed again in next week's conference.

## 2016-07-03 NOTE — Progress Notes (Signed)
[facility] Individual Statement of Services  Patient Name:  Anna Hoffman  Date:  07/03/2016  Welcome to the [facility].  Our goal is to provide you with an individualized program based on your diagnosis and situation, designed to meet your specific needs.  With this comprehensive rehabilitation program, you will be expected to participate in at least 3 hours of rehabilitation therapies Monday-Friday, with modified therapy programming on the weekends.  Your rehabilitation program will include the following services:  Physical Therapy (PT), Occupational Therapy (OT), Speech Therapy (ST), 24 hour per day rehabilitation nursing, Case Management (Social Worker), Rehabilitation Medicine, Nutrition Services and Pharmacy Services  Weekly team conferences will be held on Wednesdays to discuss your progress.  Your Social Worker will talk with you frequently to get your input and to update you on team discussions.  Team conferences with you and your family in attendance may also be held.  Expected length of stay: 10 to 12 days  Overall anticipated outcome: Modified Independent with supervision for community ambulation and stairs  Depending on your progress and recovery, your program may change. Your Social Worker will coordinate services and will keep you informed of any changes. Your Social Worker's name and contact numbers are listed  below.  The following services may also be recommended but are not provided by the [facility] will be made to provide these services after discharge if needed.  Arrangements include referral to agencies that provide these services.  Your insurance has been verified to be:  [employer] Your primary doctor is:  Dr. Glendale Chard  Pertinent information will be shared with your doctor  and your insurance company.  Social Worker:  Alfonse Alpers, LCSW  [phone] or [phone]  Information discussed with and copy given to patient by: Trey Sailors, 07/03/2016, 10:46 AM

## 2016-07-04 ENCOUNTER — Inpatient Hospital Stay (HOSPITAL_COMMUNITY): Payer: Medicare Other | Admitting: Speech Pathology

## 2016-07-04 ENCOUNTER — Inpatient Hospital Stay (HOSPITAL_COMMUNITY): Payer: Medicare Other | Admitting: Physical Therapy

## 2016-07-04 ENCOUNTER — Encounter (HOSPITAL_COMMUNITY): Payer: Self-pay

## 2016-07-04 ENCOUNTER — Inpatient Hospital Stay (HOSPITAL_COMMUNITY): Payer: Medicare Other | Admitting: Occupational Therapy

## 2016-07-04 LAB — GLUCOSE, CAPILLARY
GLUCOSE-CAPILLARY: 105 mg/dL — AB (ref 65–99)
GLUCOSE-CAPILLARY: 110 mg/dL — AB (ref 65–99)
Glucose-Capillary: 115 mg/dL — ABNORMAL HIGH (ref 65–99)
Glucose-Capillary: 142 mg/dL — ABNORMAL HIGH (ref 65–99)

## 2016-07-04 MED ORDER — HYDROCORTISONE 1 % EX CREA
TOPICAL_CREAM | Freq: Three times a day (TID) | CUTANEOUS | Status: DC | PRN
  Administered 2016-07-10: 09:00:00 via TOPICAL
  Filled 2016-07-04: qty 28

## 2016-07-04 MED ORDER — DICLOFENAC SODIUM 1 % TD GEL
2.0000 g | Freq: Three times a day (TID) | TRANSDERMAL | Status: DC
  Administered 2016-07-04 – 2016-07-12 (×24): 2 g via TOPICAL
  Filled 2016-07-04: qty 100

## 2016-07-04 NOTE — Progress Notes (Signed)
Speech Language Pathology Daily Session Note  Patient Details  Name: Anna Hoffman MRN: [id number] Date of Birth: [birth year]  Today's Date: 07/04/2016 SLP Individual Time: 0905-1000 SLP Individual Time Calculation (min): 55 min   Short Term Goals: Week 1: SLP Short Term Goal 1 (Week 1): Given supervision verbal cues, patient will demonstrate alternating attention to functional, daily tasks in a moderately distracting environment. SLP Short Term Goal 2 (Week 1): Patient will problem solve for mildly complex functional tasks given supervision verbal and question cues.   Skilled Therapeutic Interventions: Pt was seen for skilled ST targeting cognitive goals.  Pt needed min cues for redirection in a minimally distracting environment during a familiar catalog shopping task with store flyers from the local newspaper due to verbosity.  Pt was able to complete functional math calculations within task for 100%  accuracy with x1 supervision verbal cue to monitor and correct error.  Pt needed min assist verbal cues for task organization and working memory during a novel scheduling task for 100% accuracy.  SLP discussed organizational techniques such as making a list and crossing items off the list as they are completed.  Pt was left in recliner with call bell within reach.  Continue per current plan of care.       Function:  Eating Eating     Eating Assist Level: No help, No cues           Cognition Comprehension Comprehension assist level: Understands basic 90% of the time/cues < 10% of the time  Expression   Expression assist level: Expresses basic 90% of the time/requires cueing < 10% of the time.  Social Interaction    Problem Solving Problem solving assist level: Solves basic 50 - 74% of the time/requires cueing 25 - 49% of the time  Memory Memory assist level: Recognizes or recalls 90% of the time/requires cueing < 10% of the time    Pain Pain Assessment Pain Assessment: No/denies  pain  Therapy/Group: Individual Therapy  Alicen Donalson, Elmyra Ricks L 07/04/2016, 10:00 AM

## 2016-07-04 NOTE — IPOC Note (Signed)
Overall Plan of Care [facility]) Patient Details Name: TRENESHA ALCAIDE MRN: [id number] DOB: 1934-12-05  Admitting Diagnosis: cva  Hospital Problems: Principal Problem:   Thalamic hemorrhage ([place]) Active Problems:   Benign essential HTN   Diabetes mellitus type 2 in nonobese [facility])     Functional Problem List: Nursing Behavior, Endurance, Medication Management, Motor, Nutrition, Pain, Safety, Skin Integrity  PT Balance, Motor, Endurance, Safety, Sensory  OT Balance, Cognition, Motor, Endurance  SLP Cognition  TR         Basic ADL's: OT Grooming, Bathing, Dressing, Toileting     Advanced  ADL's: OT Full Meal Preparation, Laundry, Light Housekeeping     Transfers: PT Bed Mobility, Bed to Chair, Car, Floor, Manufacturing systems engineer, Metallurgist: PT Ambulation, Emergency planning/management officer, Stairs     Additional Impairments: OT Fuctional Use of Upper Extremity  SLP Social Cognition   Problem Solving, Attention, Awareness  TR      Anticipated Outcomes Item Anticipated Outcome  Self Feeding independent  Swallowing      Basic self-care  modified independent  Toileting  modified independent   Bathroom Transfers modified independent  Bowel/Bladder  patient will be continent of bowel and bladder with min assist  Transfers  Mod I with LRAD  Locomotion  Mod I with LRAD   Communication     Cognition  Mod I  Pain  pain will be less than or equal to 4 on a scale of 1-10   Safety/Judgment  patient will be free from skin breakdown/injury and displaying sound safety judgement with min assist   Therapy Plan: PT Intensity: Minimum of 1-2 x/day ,45 to 90 minutes PT Frequency: 5 out of 7 days PT Duration Estimated Length of Stay: 10-12 OT Intensity: Minimum of 1-2 x/day, 45 to 90 minutes OT Frequency: 5 out of 7 days OT Duration/Estimated Length of Stay: 10-12 days SLP Intensity: Minumum of 1-2 x/day, 30 to 90 minutes SLP Frequency: 3 to 5 out of 7 days SLP  Duration/Estimated Length of Stay: 14-21 days       Team Interventions: Nursing Interventions Patient/Family Education, Disease Management/Prevention, Pain Management, Medication Management, Skin Care/Wound Management, Discharge Planning  PT interventions Ambulation/gait training, Training and development officer, Community reintegration, Discharge planning, Disease management/prevention, DME/adaptive equipment instruction, Functional mobility training, Neuromuscular re-education, Patient/family education, Psychosocial support, Splinting/orthotics, Skin care/wound management, Stair training, Therapeutic Activities, Therapeutic Exercise, UE/LE Strength taining/ROM, UE/LE Coordination activities, Visual/perceptual remediation/compensation, Wheelchair propulsion/positioning  OT Interventions Training and development officer, Discharge planning, Cognitive remediation/compensation, Academic librarian, Engineer, drilling, Disease mangement/prevention, Neuromuscular re-education, Psychosocial support, UE/LE Strength taining/ROM, Therapeutic Exercise, Patient/family education, Functional mobility training, Pain management, Self Care/advanced ADL retraining, Therapeutic Activities, UE/LE Coordination activities  SLP Interventions Cognitive remediation/compensation, Cueing hierarchy, Functional tasks, Environmental controls, Internal/external aids, Patient/family education, Therapeutic Activities  TR Interventions    SW/CM Interventions Discharge Planning, Psychosocial Support, Patient/Family Education    Team Discharge Planning: Destination: PT-Home ,OT- Home , SLP-Home Projected Follow-up: PT-Home health PT, OT-  Home health OT, SLP-None Projected Equipment Needs: PT-Rolling walker with 5" wheels, OT- 3 in 1 bedside comode, Tub/shower bench, SLP-None recommended by SLP Equipment Details: PT- , OT-  Patient/family involved in discharge planning: PT- Patient,  OT-Patient, SLP-Patient  MD ELOS:  12-14 days Medical Rehab Prognosis:  Excellent Assessment: The patient has been admitted for CIR therapies with the diagnosis of thalamic hemorrhage with balance/gait deficits. The team will be addressing functional mobility, strength, stamina, balance, safety, adaptive techniques and equipment, self-care, bowel and bladder  mgt, patient and caregiver education, NMR, vestibula assessment, cogntion. Goals have been set at mod I for mobility, self-care/ADL's, cognition.    Meredith Staggers, MD, FAAPMR      See Team Conference Notes for weekly updates to the plan of care

## 2016-07-04 NOTE — Progress Notes (Signed)
Occupational Therapy Session Note  Patient Details  Name: Anna Hoffman MRN: [id number] Date of Birth: Aug 02, 1935  Today's Date: 07/04/2016 OT Individual Time: 1345-1417 OT Individual Time Calculation (min): 32 min     Short Term Goals: Week 1:  OT Short Term Goal 1 (Week 1): Pt will complete all UB bathing in standing with close supervision.  OT Short Term Goal 2 (Week 1): Pt will complete LB bathing sit to stand with supervision. OT Short Term Goal 3 (Week 1): Pt will complete all dressing sit to stand with supervision.  OT Short Term Goal 4 (Week 1): Pt will perform toilet and shower transfers with supervision using RW and appropriate shower/toilet DME.  OT Short Term Goal 5 (Week 1): Pt will complete simple meal prep with supervision using the RW for support.   Skilled Therapeutic Interventions/Progress Updates:    Pt worked on Brewing technologist and balance during session.  Had pt work on standing on foam surface while engaged in writing activity.  Min assist needed for balance secondary to pt leaning to the left and posteriorly.  Able to maintain static standing on foam for short intervals of 10 seconds but demonstrated increased weightbearing over the LLE.  Provided mirror for visual feedback.  Worked on weightshifts to the right in standing on solid surface as well as foam surface.  Transferred to and from the mat with min assist.  Pt left in bedside chair at end of session with call button in reach.    Therapy Documentation Precautions:  Precautions Precautions: Fall Precaution Comments: monitor BP (order for SBP <160) Restrictions Weight Bearing Restrictions: No  Pain: Pain Assessment Pain Assessment: 0-10 Pain Score: 0-No pain Pain Type: Chronic pain Pain Location: Head Pain Intervention(s): Medication (See eMAR) ADL: See Function Navigator for Current Functional Status.   Therapy/Group: Individual Therapy  Colson Barco OTR/L 07/04/2016, 4:27 PM
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
## 2016-07-04 NOTE — Progress Notes (Signed)
Physical Therapy Session Note  Patient Details  Name: Anna Hoffman MRN: [id number] Date of Birth: 06/03/1935  Today's Date: 07/04/2016 PT Individual Time: 1500-1600   PT Individual Time: 60 min    Short Term Goals:Week 1:  PT Short Term Goal 1 (Week 1): Bed mobility with increased time, but no coues or assist.  PT Short Term Goal 2 (Week 1): pt will ambulate 150ft with LRAD and Supervision Assist PT Short Term Goal 3 (Week 1): Patient will perform basic transfers with Supervision Assist.  PT Short Term Goal 4 (Week 1): Patient will ascend 4 steps with BUE support and Supervision Assist.   Skilled Therapeutic Interventions/Progress Updates:     Patient received supine in bed and agreeable to PT. Supine>sit with increased time, but no cues from PT. Pt donned shoes sitting EOB with supervision assist for safety only.  Balance training on Wii Fit. Tilt table x 3 and penguin slide x 2. Patient demonstrated improved bilateral WB through R  And L LE. Close supervision assist throughout balance training min cues for increased WB through forefoot. No LOB noted    Gait training x 130ft and 150ft with RW and supervision assist from PT. Patient performed improved weight shift to the R and had no LOB to L on this session.   [place] level B dynamic balance exercises at rail in hall. Min cues for proper technique and decreased compensation with gait exercises on rail in hall.   Patient returned to room and left sitting in recliner with call bell in reach and all needs met.    Therapy Documentation Precautions:  Precautions Precautions: Fall Precaution Comments: monitor BP (order for SBP <160) Restrictions Weight Bearing Restrictions: No General:   Vital Signs: Therapy Vitals Temp: 97.5 F (36.4 C) Temp Source: Oral Pulse Rate: 89 Resp: 18 BP: 111/64 Patient Position (if appropriate): Sitting Oxygen Therapy SpO2: 96 % O2 Device: Not Delivered Pain: Pain Assessment Pain Assessment:  0-10 Pain Score: 3  Pain Type: Chronic pain Pain Location: Head Pain Intervention(s): Medication (See eMAR)   See Function Navigator for Current Functional Status.   Therapy/Group: Individual Therapy   E  07/04/2016, 3:44 PM  

## 2016-07-04 NOTE — Progress Notes (Signed)
Occupational Therapy Session Note  Patient Details  Name: Anna Hoffman MRN: [id number] Date of Birth: Apr 09, 1935  Today's Date: 07/04/2016 OT Individual Time: 1101-1201 OT Individual Time Calculation (min): 60 min     Short Term Goals: Week 1:  OT Short Term Goal 1 (Week 1): Pt will complete all UB bathing in standing with close supervision.  OT Short Term Goal 2 (Week 1): Pt will complete LB bathing sit to stand with supervision. OT Short Term Goal 3 (Week 1): Pt will complete all dressing sit to stand with supervision.  OT Short Term Goal 4 (Week 1): Pt will perform toilet and shower transfers with supervision using RW and appropriate shower/toilet DME.  OT Short Term Goal 5 (Week 1): Pt will complete simple meal prep with supervision using the RW for support.   Skilled Therapeutic Interventions/Progress Updates:    Pt completed gathering clothing with use of the RW prior to bathing at the sink.  Min guard assist for balance with mobility in the room.  She was able to ambulate to the sink for bathing from wheelchair level.  Overall supervision with min instructional cueing for divided attention as she gets distracted with conversation when completing selfcare tasks.  Transferred back to the wheelchair to conclude session with call button and phone in reach.    Therapy Documentation Precautions:  Precautions Precautions: Fall Precaution Comments: monitor BP (order for SBP <160) Restrictions Weight Bearing Restrictions: No   Pain: Pain Assessment Pain Assessment: No/denies pain ADL: See Function Navigator for Current Functional Status.   Therapy/Group: Individual Therapy  Brittan Mapel OTR/L 07/04/2016, 12:41 PM

## 2016-07-05 ENCOUNTER — Inpatient Hospital Stay (HOSPITAL_COMMUNITY): Payer: Medicare Other | Admitting: Speech Pathology

## 2016-07-05 ENCOUNTER — Inpatient Hospital Stay (HOSPITAL_COMMUNITY): Payer: Medicare Other | Admitting: Occupational Therapy

## 2016-07-05 ENCOUNTER — Inpatient Hospital Stay (HOSPITAL_COMMUNITY): Payer: Medicare Other | Admitting: Physical Therapy

## 2016-07-05 LAB — GLUCOSE, CAPILLARY
GLUCOSE-CAPILLARY: 94 mg/dL (ref 65–99)
GLUCOSE-CAPILLARY: 109 mg/dL — AB (ref 65–99)
GLUCOSE-CAPILLARY: 102 mg/dL — AB (ref 65–99)
Glucose-Capillary: 109 mg/dL — ABNORMAL HIGH (ref 65–99)

## 2016-07-05 NOTE — Progress Notes (Signed)
Physical Therapy Session Note  Patient Details  Name: Anna Hoffman MRN: [id number] Date of Birth: Dec 26, 1934  Today's Date: 07/05/2016 PT Individual Time: 0805-0900 PT Individual Time Calculation (min): 55 min    Short Term Goals: Week 1:  PT Short Term Goal 1 (Week 1): Bed mobility with increased time, but no coues or assist.  PT Short Term Goal 2 (Week 1): pt will ambulate 12ft with LRAD and Supervision Assist PT Short Term Goal 3 (Week 1): Patient will perform basic transfers with Supervision Assist.  PT Short Term Goal 4 (Week 1): Patient will ascend 4 steps with BUE support and Supervision Assist.   Skilled Therapeutic Interventions/Progress Updates:     Patient received sitting EOB. Pt assisted by PT for dressing with distant supervision and min cues for safety to prevent standing without grip socks.   Gait training with RW 137ft  And 146ft with supervision assist from PT. Gait training without AD 115ft, 28ft, 38ft, 59ftand 123ft with min assist from PT. Mod cues for improved weight shift to the R and decreased force of terminal impact on the LLE to improve Wegith shitfing into the RLE.   Standing balance training with alternating toe taps on 4 inch step and toe taps to target on step 2x 10 BLE Biodex LOS 2 bouts. Weight shifting L and R 2x 3 minutes, A/P weight shifting 2 x 2 minutes. PT provided Min assist as well cues to decreased UE support and improve weight shift to the R. Patient reports pain in the R knee is keeping her from maintaining neutral WB through BLE.   Patient returned to room and left sitting in recliner with call bell in reach.   Therapy Documentation Precautions:  Precautions Precautions: Fall Precaution Comments: monitor BP (order for SBP <160) Restrictions Weight Bearing Restrictions: No Vital Signs: Therapy Vitals BP: 120/68 Pain: Pain Assessment Pain Score: 1    See Function Navigator for Current Functional Status.   Therapy/Group: Individual  Therapy  Lorie Phenix 07/05/2016, 12:42 PM

## 2016-07-05 NOTE — Progress Notes (Signed)
Anna Hoffman is a 81 y.o. female 1934/09/26 [id number]  Subjective: No new complaints. No new problems. Slept well. Feeling OK.  Objective: Vital signs in last 24 hours: Temp:  [97.5 F (36.4 C)-97.6 F (36.4 C)] 97.6 F (36.4 C) (12/02 0546) Pulse Rate:  [89-92] 92 (12/02 0546) Resp:  [16-18] 16 (12/02 0546) BP: (111-141)/(64-81) 120/68 (12/02 1008) SpO2:  [94 %-96 %] 94 % (12/02 0546) Weight change:  Last BM Date: 07/05/16  Intake/Output from previous day: 12/01 0701 - 12/02 0700 In: 1060 [P.O.:1060] Out: -  Last cbgs: CBG (last 3)   Recent Labs  07/04/16 2025 07/05/16 0628 07/05/16 1106  GLUCAP 142* 109* 94     Physical Exam General: No apparent distress   HEENT: not dry Lungs: Normal effort. Lungs clear to auscultation, no crackles or wheezes. Cardiovascular: Regular rate and rhythm, no edema Abdomen: S/NT/ND; BS(+) Musculoskeletal:  unchanged Neurological: No new neurological deficits Wounds: N/A    Skin: clear  Aging changes Mental state: Alert, oriented, cooperative    Lab Results: BMET    Component Value Date/Time   NA 135 07/02/2016 0525   K 4.5 07/02/2016 0525   CL 105 07/02/2016 0525   CO2 22 07/02/2016 0525   GLUCOSE 103 (H) 07/02/2016 0525   BUN 22 (H) 07/02/2016 0525   CREATININE 1.01 (H) 07/02/2016 0525   CALCIUM 9.4 07/02/2016 0525   GFRNONAA 52 (L) 07/02/2016 0525   GFRAA 60 (L) 07/02/2016 0525   CBC    Component Value Date/Time   WBC 8.1 07/02/2016 0525   RBC 4.10 07/02/2016 0525   HGB 12.4 07/02/2016 0525   HCT 37.4 07/02/2016 0525   PLT 359 07/02/2016 0525   MCV 91.2 07/02/2016 0525   MCH 30.2 07/02/2016 0525   MCHC 33.2 07/02/2016 0525   RDW 14.2 07/02/2016 0525   LYMPHSABS 2.7 07/02/2016 0525   MONOABS 0.5 07/02/2016 0525   EOSABS 0.3 07/02/2016 0525   BASOSABS 0.1 07/02/2016 0525    Studies/Results: No results found.  Medications: I have reviewed the patient's current medications.  Assessment/Plan:   1. CVA  - in therapy 2. DVT proph - SCDs, ambulation 3. HTN: Norvasc, avapro 4.DM2: diet controlled 5. CRI - monitor BMET    Length of stay, days: [place] , MD 07/05/2016, 1:24 PM

## 2016-07-05 NOTE — Progress Notes (Signed)
Speech Language Pathology Daily Session Note  Patient Details  Name: Anna Hoffman MRN: [id number] Date of Birth: [birth year]  Today's Date: 07/05/2016 SLP Individual Time: 1115-1200 SLP Individual Time Calculation (min): 45 min   Short Term Goals: Week 1: SLP Short Term Goal 1 (Week 1): Given supervision verbal cues, patient will demonstrate alternating attention to functional, daily tasks in a moderately distracting environment. SLP Short Term Goal 2 (Week 1): Patient will problem solve for mildly complex functional tasks given supervision verbal and question cues.   Skilled Therapeutic Interventions: Skilled treatment session focused on cognition goals. SLP facilitated session by providing supervision verbal to Mod I assistance with alternating attention to functional task within moderately distracting environment. Pt able to problem solve sequential recipe task with Mod I. Pt left in recliner in room with all needs within reach. Continue current plan of care.   Function:   Cognition Comprehension Comprehension assist level: Follows complex conversation/direction with extra time/assistive device  Expression   Expression assist level: Expresses complex ideas: With extra time/assistive device  Social Interaction Social Interaction assist level: Interacts appropriately with others with medication or extra time (anti-anxiety, antidepressant).  Problem Solving Problem solving assist level: Solves basic 90% of the time/requires cueing < 10% of the time  Memory Memory assist level: Recognizes or recalls 90% of the time/requires cueing < 10% of the time    Pain Pain Assessment Pain Score: 1   Therapy/Group: Individual Therapy  Syrenity Klepacki 07/05/2016, 12:53 PM

## 2016-07-05 NOTE — Progress Notes (Signed)
Occupational Therapy Session Note  Patient Details  Name: Anna Hoffman MRN: [id number] Date of Birth: [birth year]  Today's Date: 07/05/2016 OT Individual Time: 1357-1426 and 1005-1100 OT Individual Time Calculation (min): 29 min and 55 min  Short Term Goals: Week 1:  OT Short Term Goal 1 (Week 1): Pt will complete all UB bathing in standing with close supervision.  OT Short Term Goal 2 (Week 1): Pt will complete LB bathing sit to stand with supervision. OT Short Term Goal 3 (Week 1): Pt will complete all dressing sit to stand with supervision.  OT Short Term Goal 4 (Week 1): Pt will perform toilet and shower transfers with supervision using RW and appropriate shower/toilet DME.  OT Short Term Goal 5 (Week 1): Pt will complete simple meal prep with supervision using the RW for support. :     Skilled Therapeutic Interventions/Progress Updates:   Skilled OT session completed with focus on balance, safety awareness, and ADL retraining. Pt was sitting in recliner with nursing present at time of arrival, agreeable to shower. BP checked by nursing and stable at start of session. After completing toilet transfer/toileting with steady assist, pt ambulated to shower bench without device. Bathing completed with overall Min A for back with pt reporting that she used a LH sponge at home. Dressing was then completed at EOB with pt retrieving clothing items in drawers. Oral care completed in standing at sink with setup. Afterwards pt ambulated to gym to retrieve LH sponge from supplies. Pt cued to abstain from "furniture walking" with poor carryover. No LOBs, but pt "swaying" with occasional assist from therapist to correct. At end of session pt was transferred to recliner and left with all needs within reach. No c/o dizziness during session.  2nd Session 1:1 Tx (29 min) Pt participated in skilled OT session focusing on balance remediation, weight shifting, and safety awareness during housekeeping task completion.  Pt was received sitting in recliner, agreeable to go to therapy apartment. Pt ambulated to apartment with steady assist and cues for not furniture walking. Vaccumming completed with mod cues for safety with cord mgt and steady assist. 1 LOB when weight shifting with therapist correction. Pt required rest breaks during task and able to power up from low couch after resting with steady assist x2. Pt then ambulated back to room in manner as written above. Pt left in recliner with son present and all needs within reach at time of departure. Min c/o dizziness during session when quickly changing directions, absolved with rest.   Therapy Documentation Precautions:  Precautions Precautions: Fall Precaution Comments: monitor BP (order for SBP <160) Restrictions Weight Bearing Restrictions: No General:   Vital Signs: Therapy Vitals Temp: 97.9 F (36.6 C) Temp Source: Oral Pulse Rate: 94 Resp: 16 BP: 127/67 Patient Position (if appropriate): Sitting Oxygen Therapy SpO2: 100 % O2 Device: Not Delivered Pain: No c/o pain during sessions  :    See Function Navigator for Current Functional Status.   Therapy/Group: Individual Therapy  Anushka Hartinger A Winferd Wease 07/05/2016, 6:32 PM

## 2016-07-06 ENCOUNTER — Inpatient Hospital Stay (HOSPITAL_COMMUNITY): Payer: Medicare Other | Admitting: Physical Therapy

## 2016-07-06 LAB — GLUCOSE, CAPILLARY
GLUCOSE-CAPILLARY: 104 mg/dL — AB (ref 65–99)
Glucose-Capillary: 104 mg/dL — ABNORMAL HIGH (ref 65–99)
Glucose-Capillary: 151 mg/dL — ABNORMAL HIGH (ref 65–99)
Glucose-Capillary: 148 mg/dL — ABNORMAL HIGH (ref 65–99)

## 2016-07-06 NOTE — Progress Notes (Signed)
Anna Hoffman is a 81 y.o. female 1934-08-20 [id number]  Subjective: No new complaints. No new problems. Slept well. Feeling OK.  Objective: Vital signs in last 24 hours: Temp:  [98 F (36.7 C)-98.3 F (36.8 C)] 98.3 F (36.8 C) (12/03 1433) Pulse Rate:  [84-98] 98 (12/03 1433) Resp:  [16-17] 16 (12/03 1433) BP: (120-137)/(65-87) 123/65 (12/03 1433) SpO2:  [97 %-98 %] 97 % (12/03 1433) Weight change:  Last BM Date: 07/05/16  Intake/Output from previous day: 12/02 0701 - 12/03 0700 In: 600 [P.O.:600] Out: -  Last cbgs: CBG (last 3)   Recent Labs  07/05/16 2027 07/06/16 0626 07/06/16 1137  GLUCAP 102* 104* 104*     Physical Exam General: No apparent distress   HEENT: not dry Lungs: Normal effort. Lungs clear to auscultation, no crackles or wheezes. Cardiovascular: Regular rate and rhythm, no edema Abdomen: S/NT/ND; BS(+) Musculoskeletal:  unchanged Neurological: No new neurological deficits Wounds: N/A    Skin: clear  Aging changes Mental state: Alert, oriented, cooperative    Lab Results: BMET    Component Value Date/Time   NA 135 07/02/2016 0525   K 4.5 07/02/2016 0525   CL 105 07/02/2016 0525   CO2 22 07/02/2016 0525   GLUCOSE 103 (H) 07/02/2016 0525   BUN 22 (H) 07/02/2016 0525   CREATININE 1.01 (H) 07/02/2016 0525   CALCIUM 9.4 07/02/2016 0525   GFRNONAA 52 (L) 07/02/2016 0525   GFRAA 60 (L) 07/02/2016 0525   CBC    Component Value Date/Time   WBC 8.1 07/02/2016 0525   RBC 4.10 07/02/2016 0525   HGB 12.4 07/02/2016 0525   HCT 37.4 07/02/2016 0525   PLT 359 07/02/2016 0525   MCV 91.2 07/02/2016 0525   MCH 30.2 07/02/2016 0525   MCHC 33.2 07/02/2016 0525   RDW 14.2 07/02/2016 0525   LYMPHSABS 2.7 07/02/2016 0525   MONOABS 0.5 07/02/2016 0525   EOSABS 0.3 07/02/2016 0525   BASOSABS 0.1 07/02/2016 0525    Studies/Results: No results found.  Medications: I have reviewed the patient's current medications.  Assessment/Plan:    1. CVA  - cont w/therapies 2. DVT prophylaxis - on SCD, ambulating 3. HTN: Avapro, Norvasc 4. DM2: on diet 5. CRI: creatinine monitoring    Length of stay, days: 5  Walker Kehr , MD 07/06/2016, 4:00 PM

## 2016-07-07 ENCOUNTER — Inpatient Hospital Stay (HOSPITAL_COMMUNITY): Payer: Medicare Other | Admitting: Physical Therapy

## 2016-07-07 ENCOUNTER — Inpatient Hospital Stay (HOSPITAL_COMMUNITY): Payer: Medicare Other | Admitting: Speech Pathology

## 2016-07-07 ENCOUNTER — Inpatient Hospital Stay (HOSPITAL_COMMUNITY): Payer: Medicare Other | Admitting: Occupational Therapy

## 2016-07-07 LAB — GLUCOSE, CAPILLARY
GLUCOSE-CAPILLARY: 107 mg/dL — AB (ref 65–99)
GLUCOSE-CAPILLARY: 116 mg/dL — AB (ref 65–99)
GLUCOSE-CAPILLARY: 112 mg/dL — AB (ref 65–99)
GLUCOSE-CAPILLARY: 107 mg/dL — AB (ref 65–99)

## 2016-07-07 NOTE — Progress Notes (Signed)
Speech Language Pathology Daily Session Note  Patient Details  Name: Anna Hoffman MRN: [id number] Date of Birth: 07-01-1935  Today's Date: 07/07/2016 SLP Individual Time: 1120-1205 SLP Individual Time Calculation (min): 45 min   Short Term Goals: Week 1: SLP Short Term Goal 1 (Week 1): Given supervision verbal cues, patient will demonstrate alternating attention to functional, daily tasks in a moderately distracting environment. SLP Short Term Goal 2 (Week 1): Patient will problem solve for mildly complex functional tasks given supervision verbal and question cues.   Skilled Therapeutic Interventions:  Pt was seen for skilled ST targeting cognitive goals.  SLP facilitated the session with a novel, semi-complex card game to address goals for alternating attention and functional problem solving.  Pt needed min-mod assist verbal cues for working memory of task protocols and procedures in order to plan and execute a problem solving strategy appropriately.  Pt alternated her attention between task and therapist for ~30 minutes with no cues needed for redirection to task.  Pt was left in bed with bed alarm set and call bell within reach. Continue per current plan of care.    Function:  Eating Eating   Modified Consistency Diet: No Eating Assist Level: No help, No cues           Cognition Comprehension Comprehension assist level: Follows basic conversation/direction with extra time/assistive device  Expression   Expression assist level: Expresses basic needs/ideas: With extra time/assistive device  Social Interaction Social Interaction assist level: Interacts appropriately with others with medication or extra time (anti-anxiety, antidepressant).  Problem Solving Problem solving assist level: Solves basic 90% of the time/requires cueing < 10% of the time  Memory Memory assist level: Recognizes or recalls 75 - 89% of the time/requires cueing 10 - 24% of the time    Pain Pain Assessment Pain  Assessment: No/denies pain   Therapy/Group: Individual Therapy  Zylon Creamer, Selinda Orion 07/07/2016, 12:51 PM

## 2016-07-07 NOTE — Progress Notes (Signed)
Physical Therapy Session Note  Patient Details  Name: Anna Hoffman MRN: [id number] Date of Birth: 12/06/1934  Today's Date: 07/07/2016 PT Individual Time: 0034-9179 PT Individual Time Calculation (min): 61 min    Short Term Goals: Week 1:  PT Short Term Goal 1 (Week 1): Bed mobility with increased time, but no coues or assist.  PT Short Term Goal 2 (Week 1): pt will ambulate 183f with LRAD and Supervision Assist PT Short Term Goal 3 (Week 1): Patient will perform basic transfers with Supervision Assist.  PT Short Term Goal 4 (Week 1): Patient will ascend 4 steps with BUE support and Supervision Assist.    Therapy Documentation Precautions:  Precautions Precautions: Fall Precaution Comments: monitor BP (order for SBP <160) Restrictions Weight Bearing Restrictions: No  Pain: Pain Assessment Pain Assessment: No/denies pain Pain Score: 3  Faces Pain Scale: No hurt Pain Location: Head Pain Intervention(s): Medication (See eMAR)   Sit to and from stand transfer without assistive device min assist LOB to the left   Sit to and from stand transfer with offset handle cane close supervision   Patient ambulated 75 feet without assistive device min assist with three gross LOB, two to the left and one to the right requiring mod assist to recover. Patient presents with decreased foot clearance on the left. Patient internally and externally distracted throughout ambulation verbal cues throughout for attention to task. Manual facilitation provided to improved left foot clearance.   Patient ambulated 100 and 200 feet with offset handle cane close supervision with incidental min assist with LOB secondary to distraction throughout gait.    Patient up and down 12 steps with left handrail and offset handle cane min assist. Patient performed stairs with a step to pattern. Patient educated on proper sequence and technique.   Nu-Step 6 minutes level 3  B heel raises 3x10 min assist secondary to  LOB posteriorly  Mini-Squats 3x10  Patient performed rapid alternating steps on 6 inch block min assist. Emphasis on endurance, coordination and dynamic balance.   Patient returned to room at end or session with all needs met. Patient resting comfortably in recliner chair.    See Function Navigator for Current Functional Status.   Therapy/Group: Individual Therapy  WRetta Diones12/11/2015, 12:52 PM
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
## 2016-07-07 NOTE — Plan of Care (Signed)
Problem: RH PAIN MANAGEMENT Goal: RH STG PAIN MANAGED AT OR BELOW PT'S PAIN GOAL Outcome: Progressing <4

## 2016-07-07 NOTE — Progress Notes (Signed)
Occupational Therapy Session Note  Patient Details  Name: Anna Hoffman MRN: [id number] Date of Birth: 1934/09/06  Today's Date: 07/07/2016 OT Individual Time: 9030-0923 OT Individual Time Calculation (min): 60 min     Short Term Goals:Week 1:  OT Short Term Goal 1 (Week 1): Pt will complete all UB bathing in standing with close supervision.  OT Short Term Goal 2 (Week 1): Pt will complete LB bathing sit to stand with supervision. OT Short Term Goal 3 (Week 1): Pt will complete all dressing sit to stand with supervision.  OT Short Term Goal 4 (Week 1): Pt will perform toilet and shower transfers with supervision using RW and appropriate shower/toilet DME.  OT Short Term Goal 5 (Week 1): Pt will complete simple meal prep with supervision using the RW for support.   Skilled Therapeutic Interventions/Progress Updates:    Pt seen for skilled OT to facilitate dynamic balance and safety with ADL retraining for shower, toileting, dressing, grooming.  Because pt had some LOB during ADLs in previous OT session without AD, had pt use RW consistently for today's session.  With RW, pt did not have any LOB and was S with all mobility and self care. Discussed with pt training ambulation in room without AD as she did not use AD prior to this CVA.  Based on her progress in rehab she may or may not need a RW to use at home. Pt agreed with this. She demonstrated good activity tolerance but did need to cues to rest and pace herself. Pt resting in recliner at end of session with all needs met.  Therapy Documentation Precautions:  Precautions Precautions: Fall Precaution Comments: monitor BP (order for SBP <160) Restrictions Weight Bearing Restrictions: No    Vital Signs: Therapy Vitals Temp: 98 F (36.7 C) Temp Source: Oral Pulse Rate: 67 Resp: 16 BP: (!) 121/57 Patient Position (if appropriate): Lying Oxygen Therapy SpO2: 98 % O2 Device: Not Delivered Pain: Pain Assessment Pain Assessment:  0-10 Pain Score: 3  Pain Location: Head Pain Intervention(s): Medication (See eMAR) ADL:  See Function Navigator for Current Functional Status.   Therapy/Group: Individual Therapy  SAGUIER,JULIA 07/07/2016, 9:59 AM

## 2016-07-07 NOTE — Progress Notes (Signed)
Speech Language Pathology Daily Session Note  Patient Details  Name: Anna Hoffman MRN: [id number] Date of Birth: 01-16-1935  Today's Date: 07/07/2016 SLP Individual Time: 2620-3559 SLP Individual Time Calculation (min): 30 min   Short Term Goals: Week 1: SLP Short Term Goal 1 (Week 1): Given supervision verbal cues, patient will demonstrate alternating attention to functional, daily tasks in a moderately distracting environment. SLP Short Term Goal 2 (Week 1): Patient will problem solve for mildly complex functional tasks given supervision verbal and question cues.   Skilled Therapeutic Interventions: Pt was seen for skilled ST targeting cognitive goals.  SLP facilitated the session with a novel, complex deductive reasoning task to address functional problem solving.  Pt needed overall min assist verbal cues for attention to detail and working memory during task for 100% accuracy.  Reviewed and reinforced rationale for ST interventions while in CIR in order to maximize safety and functional independence.  Pt verbalized understanding.  Pt left in bed with bed alarm set and call bell within reach.  Continue per current plan of care.    Function:  Eating Eating                 Cognition Comprehension Comprehension assist level: Follows basic conversation/direction with extra time/assistive device  Expression   Expression assist level: Expresses basic needs/ideas: With extra time/assistive device  Social Interaction Social Interaction assist level: Interacts appropriately with others with medication or extra time (anti-anxiety, antidepressant).  Problem Solving Problem solving assist level: Solves basic 90% of the time/requires cueing < 10% of the time  Memory Memory assist level: Recognizes or recalls 75 - 89% of the time/requires cueing 10 - 24% of the time    Pain Pain Assessment Pain Assessment: No/denies pain  Therapy/Group: Individual Therapy  Brittane Grudzinski, Selinda Orion 07/07/2016, 2:29  PM

## 2016-07-08 ENCOUNTER — Inpatient Hospital Stay (HOSPITAL_COMMUNITY): Payer: Medicare Other | Admitting: Occupational Therapy

## 2016-07-08 ENCOUNTER — Inpatient Hospital Stay (HOSPITAL_COMMUNITY): Payer: Medicare Other | Admitting: Speech Pathology

## 2016-07-08 ENCOUNTER — Inpatient Hospital Stay (HOSPITAL_COMMUNITY): Payer: Medicare Other | Admitting: Physical Therapy

## 2016-07-08 LAB — GLUCOSE, CAPILLARY
GLUCOSE-CAPILLARY: 89 mg/dL (ref 65–99)
GLUCOSE-CAPILLARY: 111 mg/dL — AB (ref 65–99)
GLUCOSE-CAPILLARY: 124 mg/dL — AB (ref 65–99)
Glucose-Capillary: 105 mg/dL — ABNORMAL HIGH (ref 65–99)
Glucose-Capillary: 90 mg/dL (ref 65–99)

## 2016-07-08 NOTE — Progress Notes (Signed)
Occupational Therapy Session Note  Patient Details  Name: Anna Hoffman MRN: [id number] Date of Birth: Sep 04, 1934  Today's Date: 07/08/2016 OT Individual Time: 0802-0900 OT Individual Time Calculation (min): 58 min     Short Term Goals: Week 1:  OT Short Term Goal 1 (Week 1): Pt will complete all UB bathing in standing with close supervision.  OT Short Term Goal 2 (Week 1): Pt will complete LB bathing sit to stand with supervision. OT Short Term Goal 3 (Week 1): Pt will complete all dressing sit to stand with supervision.  OT Short Term Goal 4 (Week 1): Pt will perform toilet and shower transfers with supervision using RW and appropriate shower/toilet DME.  OT Short Term Goal 5 (Week 1): Pt will complete simple meal prep with supervision using the RW for support.   Skilled Therapeutic Interventions/Progress Updates:    Pt completed bathing and dressing during session.  She was able to transfer to the EOB and then ambulate with the RW to gather items for bathing and to complete transfer to the toilet.  Mod instructional cueing to sit down controlled and reach back to the wheelchair to sit.  She was able to stand for washing UB and face as well as brushing teeth, all with supervision.  Pt left in wheelchair after completion of dressing with call button and phone in reach.   Therapy Documentation Precautions:  Precautions Precautions: Fall Precaution Comments: monitor BP (order for SBP <160) Restrictions Weight Bearing Restrictions: No  Pain: Pain Assessment Pain Assessment: 0-10 Pain Score: 4  Pain Type: Chronic pain Pain Location: Knee Pain Orientation: Right Pain Descriptors / Indicators: Discomfort Pain Intervention(s): Repositioned ADL: See Function Navigator for Current Functional Status.   Therapy/Group: Individual Therapy  Heydi Swango OTR/L 07/08/2016, 9:01 AM

## 2016-07-08 NOTE — Progress Notes (Signed)
Occupational Therapy Note  Patient Details  Name: Anna Hoffman MRN: [id number] Date of Birth: Sep 26, 1934  Today's Date: 07/08/2016 OT Individual Time: 1130-1200 OT Individual Time Calculation (min): 30 min    1:1 Self care retraining:with focus on dynamic standing balance, functional mobility with a SPC while navigating through obstacle course. Pt navigated over small thresholds, pathfinding around cones, and walking over unsteady foam surface. Pt required min A. Pt reports feeling very unsteady and "feeling my head is not quite right." Reported to nursing and nursing came to assess. Pt with WFL BP and HR. Pt reporting have a low blood sugar earlier and this maybe a continued result of this.    Willeen Cass [facility] 07/08/2016, 3:29 PM
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
## 2016-07-08 NOTE — Progress Notes (Signed)
Speech Language Pathology Daily Session Note  Patient Details  Name: SONA NATIONS MRN: [id number] Date of Birth: 01/05/1935  Today's Date: 07/08/2016 SLP Individual Time: 0905-1000 SLP Individual Time Calculation (min): 55 min   Short Term Goals: Week 1: SLP Short Term Goal 1 (Week 1): Given supervision verbal cues, patient will demonstrate alternating attention to functional, daily tasks in a moderately distracting environment. SLP Short Term Goal 2 (Week 1): Patient will problem solve for mildly complex functional tasks given supervision verbal and question cues.   Skilled Therapeutic Interventions:  Pt was seen for skilled ST targeting cognitive goals.  Pt had not received breakfast upon arrival, so therapist offered pt a snack while waiting on breakfast tray.  Pt was able to divide her attention between her meal and conversations with therapist for 60 minutes with no cues needed for redirection in a mildly distracting environment.  Pt needed supervision verbal cues for recall of current ST goals and previously targeted therapy activities.  Pt was left in wheelchair with call bell within reach.  Continue per current plan of care.       Function:  Eating Eating   Modified Consistency Diet: No Eating Assist Level: No help, No cues           Cognition Comprehension Comprehension assist level: Follows complex conversation/direction with no assist  Expression   Expression assist level: Expresses complex ideas: With no assist  Social Interaction Social Interaction assist level: Interacts appropriately with others - No medications needed.  Problem Solving Problem solving assist level: Solves basic 90% of the time/requires cueing < 10% of the time  Memory Memory assist level: Recognizes or recalls 90% of the time/requires cueing < 10% of the time    Pain Pain Assessment Pain Assessment: 0-10   Therapy/Group: Individual Therapy  Amias Hutchinson, Selinda Orion 07/08/2016, 9:59 AM

## 2016-07-08 NOTE — Progress Notes (Signed)
Physical Therapy Session Note  Patient Details  Name: Anna Hoffman MRN: [id number] Date of Birth: 1934-08-24  Today's Date: 07/08/2016 PT Individual Time: 1305-1405 PT Individual Time Calculation (min): 60 min    Short Term Goals: Week 1:  PT Short Term Goal 1 (Week 1): Bed mobility with increased time, but no coues or assist.  PT Short Term Goal 2 (Week 1): pt will ambulate 157ft with LRAD and Supervision Assist PT Short Term Goal 3 (Week 1): Patient will perform basic transfers with Supervision Assist.  PT Short Term Goal 4 (Week 1): Patient will ascend 4 steps with BUE support and Supervision Assist.   Skilled Therapeutic Interventions/Progress Updates:    Pt received in recliner & agreeable to tx, denying c/o pain. BP at rest = 103/63 mmHg, HR = 90 bpm. Session focused on gait training & standing balance. Pt reports she has been using both a SPC & RW for ambulation and would prefer to use a SPC at home. Pt required min assist when ambulating with SPC 2/2 decreased balance. Recommended that pt utilize RW 2/2 poor balance with SPC & pt agreeable. Pt ambulated >150 ft with RW with min assist for weight shifting L/R with poor foot clearance and external rotation LLE, occasionally hearing shoe squeak on floor, with max cuing for obstacle avoidance as pt frequently would run into wall on L side. While in gym pt utilized biodex limits of stability focusing on weight shifting all directions but pt with poor incorporation of therapist's multimodal cuing & instructions. Pt stood on compliant surface while participating in cognitive task (pipe tree) and pt unable to assemble most simple shape without max cuing to select appropriate pieces. Pt very internally & externally distracted throughout session despite max cuing. At end of session pt left sitting on EOB with all needs within reach.   RN notified of pt's L inattention with ambulation & pt's c/o not feeling well after low blood glucose this morning & HA  this afternoon.  Therapy Documentation Precautions:  Precautions Precautions: Fall Precaution Comments: monitor BP (order for SBP <160) Restrictions Weight Bearing Restrictions: No   See Function Navigator for Current Functional Status.   Therapy/Group: Individual Therapy  Waunita Schooner 07/08/2016, 5:29 PM

## 2016-07-09 ENCOUNTER — Inpatient Hospital Stay (HOSPITAL_COMMUNITY): Payer: Medicare Other | Admitting: Speech Pathology

## 2016-07-09 ENCOUNTER — Inpatient Hospital Stay (HOSPITAL_COMMUNITY): Payer: Medicare Other | Admitting: Physical Therapy

## 2016-07-09 ENCOUNTER — Inpatient Hospital Stay (HOSPITAL_COMMUNITY): Payer: Medicare Other | Admitting: Occupational Therapy

## 2016-07-09 LAB — GLUCOSE, CAPILLARY
GLUCOSE-CAPILLARY: 103 mg/dL — AB (ref 65–99)
GLUCOSE-CAPILLARY: 80 mg/dL (ref 65–99)
GLUCOSE-CAPILLARY: 102 mg/dL — AB (ref 65–99)
GLUCOSE-CAPILLARY: 106 mg/dL — AB (ref 65–99)

## 2016-07-09 NOTE — Patient Care Conference (Signed)
Inpatient RehabilitationTeam Conference and Plan of Care Update Date: 07/09/2016   Time: 10:30 AM    Patient Name: Anna Hoffman      Medical Record Number: [id number]  Date of Birth: 1935/03/25 Sex: Female         Room/Bed: 4M01C/4M01C-01 Payor Info: Payor: [employer] MEDICARE / Plan: [facility] MEDICARE / Product Type: *No Product type* /    Admitting Diagnosis: cva  Admit Date/Time:  07/01/2016  5:24 PM Admission Comments: No comment available   Primary Diagnosis:  Thalamic hemorrhage ([place]) Principal Problem: Thalamic hemorrhage [facility])  Patient Active Problem List   Diagnosis Date Noted  . Hypertensive emergency 07/01/2016  . Thalamic hemorrhage ([place]) 07/01/2016  . 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[place])   . Benign essential HTN   . Diabetes mellitus type 2 in nonobese ([name])   . Mixed hyperlipidemia   . ICH (intracerebral hemorrhage) ([place]) - hypertensive R thalamic hemorrhage  06/27/2016  . 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 Retroperitoneal bleed 02/08/2016  . Abdominal pain   . Protein-calorie malnutrition, severe 02/05/2016  . Gastroenteritis 02/04/2016  . Diarrhea 02/04/2016  . Hypokalemia 02/04/2016  . Incarcerated paraesophageal hernia 09/02/2014  . Renal mass, left 09/02/2014  . Acute esophagitis 09/02/2014  . Gastric outlet obstruction 09/02/2014  . Hypertension   . Vomiting 09/01/2014    Expected Discharge Date: Expected Discharge Date: 07/12/16  Team Members Present: Physician leading conference: Dr. Alysia Penna Social Worker Present: Alfonse Alpers, LCSW Nurse Present: Heather Roberts, RN PT Present: Jorge Mandril, PT;Austin Edmonia James, PT OT Present: Clyda Greener, OT SLP Present: Windell Moulding, SLP PPS Coordinator present : Daiva Nakayama, RN, CRRN     Current Status/Progress Goal Weekly Team Focus  Medical   Blood pressure and blood sugar well controlled  Compliance with medications to avoid readmission  Discharge planning    Bowel/Bladder   continent of bowel and bladder. LBM 12-5  manage b/b min assist and maintain bowel movements every 1-2 days  contiune POC   Swallow/Nutrition/ Hydration             ADL's   Supervision for bathing, dressing, toilet transfers with use of the RW.  modified independent  selfcare retraining, balance retraining, transfer retraining, pt/family education   Mobility   Supervision Assist with RW for gait and transfers. occasional cues for improved weight shift into the LLE.   Mod I with LRAD. occassional supervision with stairs for access to house.    Balance. awareness of defecits. endurance, improved weight shifting. independence with LRAD.     Communication             Safety/Cognition/ Behavioral Observations  Supervision for complex   mod I   complex cognition and completion of education prior to discharge   Pain   tylenol 650mg  PRN q4 hrs, scheduled volteran gel R knee  maintain pain 2 or less on 0-10 scale  assess and medicate as needed   Skin   skin CDI  maintain skin with no breakdown while in rehab  assess skin q shift    Rehab Goals Patient on target to meet rehab goals: Yes Rehab Goals Revised: none *See Care Plan and progress notes for long and short-term goals.  Barriers to Discharge: Will need help with shopping, will not be driving. Upon discharge    Possible Resolutions to Barriers:  Family will need to arrange some help for household tasks including cooking    Discharge Planning/Teaching Needs:  Pt will return to  her home with her son.  Pt has mod I goals and is independent to direct any care she needs.   Team Discussion:  Pt is doing well medically with some urinary frequency, Dr. Letta Pate is watching.  Pt is refusing SCDs.  Pt expected to reach mod I goals and is at supervision now.  ST reports she is baseline for speech and supervision for complex tasks.  Has done well on CIR.  Revisions to Treatment Plan:  none   Continued Need for Acute  Rehabilitation Level of Care: The patient requires daily medical management by a physician with specialized training in physical medicine and rehabilitation for the following conditions: Daily direction of a multidisciplinary physical rehabilitation program to ensure safe treatment while eliciting the highest outcome that is of practical value to the patient.: Yes Daily medical management of patient stability for increased activity during participation in an intensive rehabilitation regime.: Yes Daily analysis of laboratory values and/or radiology reports with any subsequent need for medication adjustment of medical intervention for : Neurological problems  Tameya Kuznia, Silvestre Mesi 07/09/2016, 12:44 PM

## 2016-07-09 NOTE — Progress Notes (Signed)
Occupational Therapy Weekly Progress Note  Patient Details  Name: Anna Hoffman MRN: [id number] Date of Birth: 1934-09-25  Beginning of progress report period: July 02, 2016 End of progress report period: July 09, 2016  Today's Date: 07/09/2016 OT Individual Time: 6294-7654 OT Individual Time Calculation (min): 43 min     Patient has met 4 of 5 short term goals.  Anna Hoffman is making steady progress with OT at this time.  She is currently at a supervision level for functional transfers to the toilet and tub/shower bench with supervision.  She needs min to mod instructional cueing for use of the walker safely and for remembering safe techniques for transporting items in the kitchen.  Still with balance deficits noted when she attempts dynamic standing balance tasks and mobility without use of an assistive device. Anna Hoffman also continues to demonstrate decreased alternating and divided attention during functional tasks as she gets distracted with conversation and has trouble staying on the functional task she is attempting to perform.  Recommend continued OT to progress to modified independent level for discharge home with son who can assist during the morning and evenings only.        Patient continues to demonstrate the following deficits: balance, attention, memory, and therefore will continue to benefit from skilled OT intervention to enhance overall performance with BADL.  Patient progressing toward long term goals..  Continue plan of care.  OT Short Term Goals Week 2:  OT Short Term Goal 1 (Week 2): Continue working on established LTGs set at modified independent level.     Skilled Therapeutic Interventions/Progress Updates:     Pt completed functional mobility to the ADL apartment with supervision using the RW.  Performed education on tub/shower transfers using the tub bench as well as need for hand held shower.  Also discussed the need for a 3:1 at home as her toilet is standard  height.  Progressed to the kitchen for education on walker safety for simple meal prep.  She was able to ambulate in the kitchen and move items around to appropriate locations using the RW, but continues to attempt to take steps while having only one hand on the walker.  Mod instructional cueing needed for safety.  Discussed energy conservation strategies in the kitchen as well as possible need for stool to rest as well as walker bag or tray to help with transporting items.  Returned to room at end of session.  Pt left in recliner with call button in reach.   Therapy Documentation Precautions:  Precautions Precautions: Fall Precaution Comments: monitor BP (order for SBP <160) Restrictions Weight Bearing Restrictions: No  Pain: Pain Assessment Pain Assessment: No/denies pain ADL: See Function Navigator for Current Functional Status.   Therapy/Group: Individual Therapy  Anna Hoffman OTR/L 07/09/2016, 3:50 PM

## 2016-07-09 NOTE — Progress Notes (Signed)
Social Work Patient ID: Anna Hoffman, female   DOB: Sep 21, 1934, 81 y.o.   MRN: [id number]   CSW met with pt to update her on team conference discussion.  She is pleased with d/c on 07-12-16.  CSW offered to call her son, but she stated she would call him after CSW left.  Sons will both likely come to pick her up.  Pt would like to get medications at [place], as they may be more affordable.  Spoke with Marlowe Shores, PA who feels blood pressure medications pt is going home one will be more affordable than the medication she was on PTA.  Pt is open to [facility] f/u and DME recommended.  CSW will take care of these things and will remain available to assist as needed.

## 2016-07-09 NOTE — Progress Notes (Signed)
Speech Language Pathology Weekly Progress and Session Note  Patient Details  Name: ROSEY EIDE MRN: [id number] Date of Birth: 1935-05-12  Beginning of progress report period:  July 02, 2016  End of progress report period: July 09, 2016  Today's Date: 07/09/2016 SLP Individual Time: 1435-1500 SLP Individual Time Calculation (min): 25 min   Short Term Goals: Week 1: SLP Short Term Goal 1 (Week 1): Given supervision verbal cues, patient will demonstrate alternating attention to functional, daily tasks in a moderately distracting environment. SLP Short Term Goal 1 - Progress (Week 1): Met SLP Short Term Goal 2 (Week 1): Patient will problem solve for mildly complex functional tasks given supervision verbal and question cues.  SLP Short Term Goal 2 - Progress (Week 1): Met    New Short Term Goals: Week 2: SLP Short Term Goal 1 (Week 2): STG=LTG due to remaining length of stay   Weekly Progress Updates: Pt has made functional gains this reporting period and has met 2 out of 2 short term goals.  Pt is overall supervision for semi-complex cognitive tasks and is approaching long term goals of mod I.  Pt would continue to benefit from brief ST follow up while inpatient to address higher level cognitive function prior to discharge home and to complete education.      Intensity: Minumum of 1-2 x/day, 30 to 90 minutes Frequency: 3 to 5 out of 7 days Duration/Length of Stay: 14-21 days Treatment/Interventions: Cognitive remediation/compensation;Cueing hierarchy;Functional tasks;Environmental controls;Internal/external aids;Patient/family education;Therapeutic Activities   Daily Session  Skilled Therapeutic Interventions: Pt was seen for skilled ST targeting cognitive goals.  Therapist facilitated the session with a previously taught card game to address problem solving.  Pt with improved planning and carryover of task rules and procedures today in comparison to initial attempt and only  needed supervision verbal cues to plan and execute a problem solving strategy.  During functional conversations regarding current progress and goals in therapy leading up to discharge on Saturday, pt was able to verbally problem solve safe walker use strategies taught to her during occupational therapy session this AM with mod I.  Pt was left in recliner at the end of today's therapy session with call bell within reach.  Goals updated on this date to reflect current progress and plan of care.       Function:   Eating Eating                 Cognition Comprehension Comprehension assist level: Follows complex conversation/direction with extra time/assistive device  Expression   Expression assist level: Expresses complex ideas: With extra time/assistive device  Social Interaction Social Interaction assist level: Interacts appropriately with others with medication or extra time (anti-anxiety, antidepressant).  Problem Solving Problem solving assist level: Solves basic 90% of the time/requires cueing < 10% of the time  Memory Memory assist level: Recognizes or recalls 90% of the time/requires cueing < 10% of the time   General    Pain Pain Assessment Pain Assessment: No/denies pain  Therapy/Group: Individual Therapy  Filomeno Cromley, Selinda Orion 07/09/2016, 4:11 PM

## 2016-07-09 NOTE — Progress Notes (Signed)
Recreational Therapy Session Note  Patient Details  Name: Anna Hoffman MRN: [id number] Date of Birth: 07/07/1935 Today's Date: 07/09/2016  Pain: no c/o Skilled Therapeutic Interventions/Progress Updates: Pt participated in animal assisted activity/therapy seated in recliner with supervision. Pt required verbal cues for safety seated EOC.   [place] 07/09/2016, 3:47 PM

## 2016-07-09 NOTE — Progress Notes (Signed)
Physical Therapy Session Note  Patient Details  Name: Anna Hoffman MRN: [id number] Date of Birth: 1934/11/17  Today's Date: 07/09/2016 PT Individual Time: 5053-9767 AND 1100-1200 PT Individual Time Calculation (min): 74 min and 60 MIN    Short Term Goals: Week 1:  PT Short Term Goal 1 (Week 1): Bed mobility with increased time, but no coues or assist.  PT Short Term Goal 2 (Week 1): pt will ambulate 117f with LRAD and Supervision Assist PT Short Term Goal 3 (Week 1): Patient will perform basic transfers with Supervision Assist.  PT Short Term Goal 4 (Week 1): Patient will ascend 4 steps with BUE support and Supervision Assist.   Skilled Therapeutic Interventions/Progress Updates:  Session 1 Patient received sitting EOB and agreeable to PT.  Patient performed lower body dressing sitting EOB with distant supervision assist. Patient required supervision Assist for sit>stand to pull pants to waist, LOB on first attempt, requiring pt to sit back down.   Gait through room to restroom with RW and supervision assist. toielt trasfers and self hygiene with distant supervision assist.   PT instructed patient in gait training [id number] supervision assist with RW. Patient noted to have increased L lateral lean. Min cues for increased weight shift into R LE, no LOB with gait and improved WB through R following instruction. Gait training also performed with [facility] assist from PT. Min cue for 3 point gait pattern and weight shift to the R.   Dynamic gait training through obstacle course with RW and supervision assist from PT.   PT instructed patient in floor transfer x 2 with min Assist to R and L. Mod cues from PT for proper technique using non-paretic LE to push up to swing bottom to mat table.   Car transfer with RW and supervision assist from PT and min cues for safety with proper UE placement for safety.   Stair training with 2 rails and Supervision assist from PT. Min cues  for proper hand placement to improve safety as well as use of step to gait pattern to prevent LOB.   Session 2:   Patient received sitting in recliner with bell in reach. PT instructed patient in ambulatory transfer to [facility] Ri[facility] and supervision assist from PT.  Patient attempted to drive WC to elevators, but unable to coordinated use of BUE and BLE to prevent running into wall for >[id number] in dumped position noted to minimize foot contact with ground, causing lack of control.   Gait training with WC through food court to simulate a community environment with x [id number] supervision assist  Min cues for AD management on tile floor to prevent LOB as well as awareness of obstacles on the L side. Patient does not demonstrate any LOB even when hitting WC on obstacle. Additional community gait training through [employer] RW x 200ft 92f distant supervision from PT. Patient only noted to hit one obstacle on the L throughout gait and demonstrated high level cognitive awareness for proper money management to buy several items.   Pt returned to rehab unit with gait training through hospital hall x 200ft a91fistant supervision assist.   Dynamic stand balance training for foot taps and step ups x 10 BLE each. Pt required occasional min assist with toe taps and constant assist with step ups. LOB blamed on R knee pain, but noted to occur toward L side.   Patient returned to room and let sitting in call bell in  reach and with all needs met.          Therapy Documentation Precautions:  Precautions Precautions: Fall Precaution Comments: monitor BP (order for SBP <160) Restrictions Weight Bearing Restrictions: No General:   Vital Signs: Therapy Vitals Temp: 97.5 F (36.4 C) Temp Source: Oral Pulse Rate: 84 Resp: 16 BP: 132/72 Patient Position (if appropriate): Lying Oxygen Therapy SpO2: 95 % O2 Device: Not Delivered Pain:   0/10 but reports that face is itchy  See Function  Navigator for Current Functional Status.   Therapy/Group: Individual Therapy  Lorie Phenix 07/09/2016, 8:03 AM
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
## 2016-07-10 ENCOUNTER — Inpatient Hospital Stay (HOSPITAL_COMMUNITY): Payer: Medicare Other | Admitting: Speech Pathology

## 2016-07-10 ENCOUNTER — Inpatient Hospital Stay (HOSPITAL_COMMUNITY): Payer: Medicare Other | Admitting: Occupational Therapy

## 2016-07-10 ENCOUNTER — Inpatient Hospital Stay (HOSPITAL_COMMUNITY): Payer: Medicare Other | Admitting: Physical Therapy

## 2016-07-10 LAB — GLUCOSE, CAPILLARY
GLUCOSE-CAPILLARY: 85 mg/dL (ref 65–99)
GLUCOSE-CAPILLARY: 95 mg/dL (ref 65–99)
GLUCOSE-CAPILLARY: 101 mg/dL — AB (ref 65–99)
Glucose-Capillary: 106 mg/dL — ABNORMAL HIGH (ref 65–99)

## 2016-07-10 NOTE — Progress Notes (Signed)
Physical Therapy Weekly Progress Note  Patient Details  Name: Anna Hoffman MRN: [id number] Date of Birth: Oct 02, 1934  Beginning of progress report period: July 02, 2016 End of progress report period: July 10, 2016  Today's Date: 07/10/2016 PT Individual Time: 0801-0900 PT Individual Time Calculation (min): 59 min   Patient has met 4 of 4 short term goals.    Patient continues to demonstrate the following deficits: balance, strength, L sided awareness,  and therefore will continue to benefit from skilled PT intervention to enhance overall performance with activity tolerance, balance, postural control, functional use of  left lower extremity and awareness.  Patient progressing toward long term goals..  Continue plan of care.  PT Short Term Goals Week 1:  PT Short Term Goal 1 (Week 1): Bed mobility with increased time, but no coues or assist.  PT Short Term Goal 1 - Progress (Week 1): Met PT Short Term Goal 2 (Week 1): pt will ambulate 11f with LRAD and Supervision Assist PT Short Term Goal 2 - Progress (Week 1): Met PT Short Term Goal 3 (Week 1): Patient will perform basic transfers with Supervision Assist.  PT Short Term Goal 3 - Progress (Week 1): Met PT Short Term Goal 4 (Week 1): Patient will ascend 4 steps with BUE support and Supervision Assist.  PT Short Term Goal 4 - Progress (Week 1): Met Week 2:  PT Short Term Goal 1 (Week 2): STG=LTG due to anticipated d/c    Skilled Therapeutic Interventions/Progress Updates:     Patient received sitting EOB able agreeable to PT.   Donned shoes sitting EOB with distant supervision Assist from PT for safety.   Gait training to rehab gym x [id number] And [id number] RW and distant supervision assist and occasional cues for improved foot clearance.   PT instructed patient in [place] curls, Hip Abduction, Mini squats, and sit stand. All strengthening therex Completed x 10. Tandem walking with BUE support 2x 10 ft. PT  provided patient with min cues for proper technique including decreased speed and reduced compensation from trunk.   Gait training with SPC [id number] on carpeted and tile floor; close supervision assist progressing to min assist from PT with cues for improved weight shifting.   Gait training with RW back to room x [id number] distant supervision vision. No LOB noted.   Patient left sitting in recliner with call bell in reach. .       Therapy Documentation Precautions:  Precautions Precautions: Fall Precaution Comments: monitor BP (order for SBP <160) Restrictions Weight Bearing Restrictions: No General:   Vital Signs: Therapy Vitals Temp: 98 F (36.7 C) Temp Source: Oral Pulse Rate: 87 Resp: 17 BP: 133/82 Patient Position (if appropriate): Lying Oxygen Therapy SpO2: 98 % O2 Device: Not Delivered Pain:   0/10  See Function Navigator for Current Functional Status.  Therapy/Group: Individual Therapy  AustiLorie Phenix/2017, 8:02 AM

## 2016-07-10 NOTE — Progress Notes (Signed)
Occupational Therapy Session Note  Patient Details  Name: [name] MRN: [id number] Date of Birth: 11-05-1934  Today's Date: 07/10/2016 OT Individual Time: 1035-1200 OT Individual Time Calculation (min): 85 min     Short Term Goals: Week 2:  OT Short Term Goal 1 (Week 2): Continue working on established LTGs set at modified independent level.    Skilled Therapeutic Interventions/Progress Updates:    Treatment session with focus on ADL retraining, functional mobility with RW, and increased independence with ADLs and IADLs.  Pt received in recliner requesting to shower.  Ambulated around room with RW to gather clothing prior to bathing with distant supervision, min cues for problem solving when sidestepping around bed to access dresser.  Pt completed bathing at sit > stand level in room shower with distant supervision with use of long handled sponge for increased independence.  UB dressing completed in standing with setup assist.  Increased time to complete LB dressing and fastening shoes, as pt verbose and internally as well as externally distracted.  Ambulated through ADL kitchen with pt retrieving items from refrigerator and various cabinets with focus on increased safety with RW, pt demonstrating improved safety and positioning of RW during mobility this session.  Issued pt walker bag to allow pt to keep hands on walker while allowing her to transport items.  Engaged in [employer] balance activities with pt demonstrating good static standing balance and decreased falls risk, however demonstrating increased difficulty with weight shifting as pt was quite impulsive with movements and difficulty weight shifting anterior and to Rt.  Ambulated around therapy gym with RW to retreive items from various heights and place them in RW bag, with focus on safe management of RW during mobility.  Pt with minimal sidestepping outside of RW when reaching for items this session.  Returned to room and left seated in  recliner with all needs in reach.  Therapy Documentation Precautions:  Precautions Precautions: Fall Precaution Comments: monitor BP (order for SBP <160) Restrictions Weight Bearing Restrictions: No General:   Vital Signs: Therapy Vitals Temp: 98.3 F (36.8 C) Temp Source: Oral Pulse Rate: 94 Resp: 18 BP: 120/67 Patient Position (if appropriate): Lying Oxygen Therapy SpO2: 99 % O2 Device: Not Delivered Pain:   Pt with no c/o pain  See Function Navigator for Current Functional Status.   Therapy/Group: Individual Therapy  Simonne Come 07/10/2016, 3:28 PM

## 2016-07-10 NOTE — Progress Notes (Signed)
Speech Language Pathology Daily Session Note  Patient Details  Name: Anna Hoffman MRN: [id number] Date of Birth: [birth year]  Today's Date: 07/10/2016 SLP Individual Time: 0901-0958 SLP Individual Time Calculation (min): 57 min   Short Term Goals:Week 2: SLP Short Term Goal 1 (Week 2): STG=LTG due to remaining length of stay   Skilled Therapeutic Interventions: Pt was seen for skilled ST targeting cognitive goals.  SLP repeated medication management task from previous therapy session to measure progress from initial attempt.  Pt demonstrated improved task organization when loading pills into a pill box, even when provided with mild distractions and was able to recognize and correct errors with only supervision verbal cues.  Therapist also facilitated the session with a deductive reasoning puzzle to address functional problem solving.  Pt needed overall supervision cues for task organization but needed up to min assist verbal cues to correct errors.    Pt was left in recliner at the end of today's therapy session with call bell within reach.  Continue per current plan of care.       Function:  Eating Eating                 Cognition Comprehension Comprehension assist level: Follows complex conversation/direction with extra time/assistive device  Expression   Expression assist level: Expresses complex ideas: With extra time/assistive device  Social Interaction Social Interaction assist level: Interacts appropriately with others with medication or extra time (anti-anxiety, antidepressant).  Problem Solving Problem solving assist level: Solves basic 90% of the time/requires cueing < 10% of the time  Memory Memory assist level: Recognizes or recalls 90% of the time/requires cueing < 10% of the time    Pain Pain Assessment Pain Assessment: No/denies pain  Therapy/Group: Individual Therapy  Tuff Clabo, Selinda Orion 07/10/2016, 9:58 AM
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
## 2016-07-11 ENCOUNTER — Inpatient Hospital Stay (HOSPITAL_COMMUNITY): Payer: Medicare Other | Admitting: Speech Pathology

## 2016-07-11 ENCOUNTER — Inpatient Hospital Stay (HOSPITAL_COMMUNITY): Payer: Medicare Other | Admitting: Occupational Therapy

## 2016-07-11 ENCOUNTER — Inpatient Hospital Stay (HOSPITAL_COMMUNITY): Payer: Medicare Other | Admitting: Physical Therapy

## 2016-07-11 LAB — GLUCOSE, CAPILLARY
GLUCOSE-CAPILLARY: 100 mg/dL — AB (ref 65–99)
GLUCOSE-CAPILLARY: 84 mg/dL (ref 65–99)
Glucose-Capillary: 132 mg/dL — ABNORMAL HIGH (ref 65–99)
Glucose-Capillary: 101 mg/dL — ABNORMAL HIGH (ref 65–99)

## 2016-07-11 MED ORDER — AMLODIPINE BESYLATE 5 MG PO TABS: 5.0000 mg | ORAL_TABLET | Freq: Every day | ORAL | 1 refills | Status: DC

## 2016-07-11 MED ORDER — IRBESARTAN 150 MG PO TABS: 150.0000 mg | ORAL_TABLET | Freq: Every day | ORAL | 1 refills | Status: DC

## 2016-07-11 MED ORDER — DICLOFENAC SODIUM 1 % TD GEL: 2.0000 g | Freq: Three times a day (TID) | TRANSDERMAL | 1 refills | Status: DC

## 2016-07-11 MED ORDER — ROSUVASTATIN CALCIUM 10 MG PO TABS
10.0000 mg | ORAL_TABLET | Freq: Every day | ORAL | 1 refills | Status: DC
Start: 2016-07-11 — End: 2018-10-06

## 2016-07-11 MED ORDER — OMEPRAZOLE 40 MG PO CPDR: 40.0000 mg | DELAYED_RELEASE_CAPSULE | Freq: Every day | ORAL | 1 refills | Status: DC

## 2016-07-11 MED FILL — IRBESARTAN 150 MG TABLET: 150 | 30 days supply | Qty: 30 | Fill #0

## 2016-07-11 MED FILL — AMLODIPINE BESYLATE 5 MG TA: 5 | 30 days supply | Qty: 30 | Fill #0

## 2016-07-11 MED FILL — OMEPRAZOLE DR 40 MG CAPSULE: 40 | 30 days supply | Qty: 30 | Fill #0

## 2016-07-11 MED FILL — ROSUVASTATIN CALCIUM 10 MG: 10 | 30 days supply | Qty: 30 | Fill #0

## 2016-07-11 NOTE — Progress Notes (Signed)
Physical Therapy Session Note  Patient Details  Name: Anna Hoffman MRN: [id number] Date of Birth: 09/23/1934  Today's Date: 07/11/2016 1100-1200 60 min group therapy session     Therapy Documentation Precautions:  Precautions Precautions: Fall Precaution Comments: monitor BP (order for SBP <160) Restrictions Weight Bearing Restrictions: No Pain: Pain Assessment Pain Assessment: No/denies pain   Patient seen for 60 min group therapy session received in gym.   Patient performed ambulatory transfers with RW mod I  Patient ambulated 250 feetx2 with RW mod I. Patient ambulated with a step through gait pattern. NO overt LOB.  Patient up and down 16 steps with bilateral handrail mod I.   Nu-step 10 minutes level 4  Patient performed lateral stepping to the left and right while performing mini squats and B heel raises close supervision without assistive device verbal cues for for proper sequence and technique 20 feetx2  Short sit to and from supine on mat mod I  Bridging 15x2 with a 5 second hold  Patient transition from supine on mat to quadruped close supervision. Once in quadruped position patient performed alternating UE and LE extension with min assist and verbal cues for proper sequence and technique.   Patient performed standing cone taps bilaterally without assistive device min assist for approximately 4 minuters focusing on coordination, and dynamic balance.   Patient returned to room at end of session resting in recliner chair with all needs met.       See Function Navigator for Current Functional Status.   Therapy/Group: Group  , J 07/11/2016, 12:31 PM  

## 2016-07-11 NOTE — Progress Notes (Signed)
Physical Therapy Discharge Summary  Patient Details  Name: Anna Hoffman MRN: [id number] Date of Birth: May 20, 1935  Today's Date: 07/11/2016 PT Individual Time:805-900   PT Individual Time Calculaiton: 77mn     Patient has met 10 of 10 long term goals due to improved activity tolerance, improved balance, improved postural control, increased strength, functional use of  left lower extremity, improved awareness and improved coordination.  Patient to discharge at an ambulatory level Modified Independent.   Patient's care partner unavailable to provide the necessary physical assistance at discharge.  Reasons goals not met: All PT goals met.   Recommendation:  Patient will benefit from ongoing skilled PT services in home health setting to continue to advance safe functional mobility, address ongoing impairments in balance, strength, gait, coordination, and minimize fall risk.  Equipment: Rolling walker  Reasons for discharge: treatment goals met and discharge from hospital  Patient/family agrees with progress made and goals achieved: Yes   PT Treatment:  Pt received sitting EOB and agreeable to PT. Pt donned pants without cues or assist from PT, but noted to require extra time. Pt assisted patient to don shoes with min assist for time management.  PT instructed patient in grad day assessment to determine progress toward goals. As indicated below PT instructed patient in gait training on level and unlevel surfaces, basic transfers, stair, ramp and curb negotiation, bed mobility, and car transfer. All completed with RW and no cues or assist from PT.  Patient also instructed in Berg balance test. Patient demonstrates increased fall risk as noted by score of  47 /56 on Berg Balance Scale.  (<36= high risk for falls, close to 100%; 37-45 significant >80%; 46-51 moderate >50%; 52-55 lower >25%) Patient returned to room and left sitting in recliner with call bell in reach.    PT  Discharge Precautions/RestrictionsRestrictions Weight Bearing Restrictions: No Vital Signs Therapy Vitals Temp: 97.5 F (36.4 C) Temp Source: Oral Pulse Rate: 94 Resp: 17 BP: 128/73 Patient Position (if appropriate): Lying Oxygen Therapy SpO2: 97 % O2 Device: Not Delivered Pain Pain Assessment Pain Assessment: No/denies pain Pain Score: 0-No pain Vision/Perception  Vision - Assessment Eye Alignment: Impaired (comment) Ocular Range of Motion: Restricted on the right Tracking/Visual Pursuits: Decreased smoothness of horizontal tracking Saccades: Undershoots  Cognition Overall Cognitive Status: Within Functional Limits for tasks assessed Orientation Level: Oriented X4 Sustained Attention: Appears intact Selective Attention: Appears intact Alternating Attention: Appears intact Divided Attention: Impaired Awareness: Appears intact Problem Solving: Appears intact Safety/Judgment: Appears intact Sensation Sensation Light Touch: Appears Intact Hot/Cold: Appears Intact Proprioception: Appears Intact Additional Comments: BLE sensation intact Coordination Gross Motor Movements are Fluid and Coordinated: Yes Fine Motor Movements are Fluid and Coordinated: Yes ( ) Motor  Motor Motor: Abnormal postural alignment and control  Mobility Bed Mobility Bed Mobility: Rolling Right;Rolling Left;Supine to Sit;Sit to Supine Rolling Right: 6: Modified independent (Device/Increase time) Rolling Left: 6: Modified independent (Device/Increase time) Supine to Sit: 6: Modified independent (Device/Increase time) Sit to Supine: 6: Modified independent (Device/Increase time) Transfers Transfers: Yes Sit to Stand: 6: Modified independent (Device/Increase time) Stand to Sit: 6: Modified independent (Device/Increase time) Stand Pivot Transfers: 6: Modified independent (Device/Increase time) (with RW) Locomotion  Ambulation Ambulation: Yes Ambulation/Gait Assistance: 6: Modified independent  (Device/Increase time) Ambulation Distance (Feet): 150 Feet Assistive device: Rolling walker Gait Gait: Yes Gait Pattern: Impaired Gait Pattern: Narrow base of support;Step-through pattern Stairs / Additional Locomotion Stairs: Yes Stairs Assistance: 6: Modified independent (Device/Increase time) Stair Management Technique: Two rails  Number of Stairs: 12 Height of Stairs: 6 Ramp: 6: Modified independent (Device) Curb: 6: Modified independent (Device/increase time) Product manager Mobility: No  Trunk/Postural Assessment    WFL Balance Balance Balance Assessed: Yes Standardized Balance Assessment Standardized Balance Assessment: Berg Balance Test Berg Balance Test Sit to Stand: Able to stand  independently using hands Standing Unsupported: Able to stand safely 2 minutes Sitting with Back Unsupported but Feet Supported on Floor or Stool: Able to sit safely and securely 2 minutes Stand to Sit: Controls descent by using hands Transfers: Able to transfer safely, definite need of hands Standing Unsupported with Eyes Closed: Able to stand 10 seconds safely Standing Ubsupported with Feet Together: Able to place feet together independently and stand 1 minute safely From Standing, Reach Forward with Outstretched Arm: Can reach confidently >25 cm (10") From Standing Position, Pick up Object from Floor: Able to pick up shoe safely and easily From Standing Position, Turn to Look Behind Over each Shoulder: Looks behind one side only/other side shows less weight shift Turn 360 Degrees: Able to turn 360 degrees safely in 4 seconds or less Standing Unsupported, Alternately Place Feet on Step/Stool: Able to stand independently and complete 8 steps >20 seconds Standing Unsupported, One Foot in Front: Able to plae foot ahead of the other independently and hold 30 seconds Standing on One Leg: Tries to lift leg/unable to hold 3 seconds but remains standing independently Total Score:  47 Dynamic Sitting Balance Dynamic Sitting - Balance Support: No upper extremity supported Dynamic Sitting - Level of Assistance: 6: Modified independent (Device/Increase time) Static Standing Balance Static Standing - Balance Support: No upper extremity supported Static Standing - Level of Assistance: 6: Modified independent (Device/Increase time) Dynamic Standing Balance Dynamic Standing - Balance Support: Right upper extremity supported Dynamic Standing - Level of Assistance: 6: Modified independent (Device/Increase time) Extremity Assessment      RLE Assessment RLE Assessment: Exceptions to [facility] (4+/5 grossly, proximal to distal. ) LLE Assessment LLE Assessment: Exceptions to [facility] (4+/5 grossly except hip flexion 4/5)   See Function Navigator for Current Functional Status.  Lorie Phenix 07/11/2016, 9:01 AM

## 2016-07-11 NOTE — Consult Note (Signed)
   [facility] CM Inpatient Consult   07/11/2016  Delcie Ruppert [facility] [birth year] [id number]   Received referral from inpatient rehab CSW for [place] [facility] follow up post hospital discharge. Spoke with Ms. Vanwart at bedside to discuss and offer [place] [facility] program services. Ms. Cuadrado is agreeable and states she would rather have telephone calls rather than home visits. Written consent obtained.   Ms. Kastelic endorses she lives with her oldest son, [name]. States her grandaughter and niece take her to MD appointments. Ms. Sigala indicates she has been pretty active as of late and is a little frustrated with her recent medical issues. Ms. Riess reports she has issues with affording her medications, especially her bp medications. States one of bp medications are 100 plus dollars a month for 30 day supply. States she goes to [facility] on [address] in [place]. States she is on a limited social security income and she does not get her check until Wednesday. Also states she had to go back to work because of her fixed income. Discussed that [place] referral will be made on her behalf to see if she qualifies for any type of medication assistance. She is agreeable to Pharmacy referral as well.   Provided [facility] Care Management packet, contact information, and 24-hr nurse line magnet. Will make CIR social worker aware that [facility] will follow. Appreciative of the collaboration efforts from the inpatient rehab social workers.   Will refer to [place] for post discharge calls, per Ms.Stephani request, as well as request referral for [facility] Pharmacist for follow regarding possible medication assistance.   Marthenia Rolling, MSN-Ed, RN,BSN [facility] Liaison [phone]

## 2016-07-11 NOTE — Plan of Care (Signed)
Problem: RH Tub/Shower Transfers Goal: LTG Patient will perform tub/shower transfers w/assist (OT) LTG: Patient will perform tub/shower transfers with assist, with/without cues using equipment (OT)  Outcome: Not Met (add Reason) Needs supervision for safety.  Problem: RH Memory Goal: LTG Patient will demonstrate ability for day to day (OT) LTG:  Patient will demonstrate ability for day to day recall/carryover during activities of daily living with assist  (OT)  Outcome: Not Met (add Reason) Needs supervision for recalling at times.

## 2016-07-11 NOTE — Discharge Instructions (Signed)
Inpatient Rehab Discharge Instructions  [facility] Discharge date and time: No discharge date for patient encounter.   Activities/Precautions/ Functional Status: Activity: activity as tolerated Diet: diabetic diet Wound Care: none needed Functional status:  ___ No restrictions     ___ Walk up steps independently ___ 24/7 supervision/assistance   ___ Walk up steps with assistance ___ Intermittent supervision/assistance  ___ Bathe/dress independently ___ Walk with walker     _x STROKE/TIA DISCHARGE INSTRUCTIONS SMOKING Cigarette smoking nearly doubles your risk of having a stroke & is the single most alterable risk factor  If you smoke or have smoked in the last 12 months, you are advised to quit smoking for your health.  Most of the excess cardiovascular risk related to smoking disappears within a year of stopping.  Ask you doctor about anti-smoking medications  [place] Quit Line: 1-800-QUIT NOW  Free Smoking Cessation Classes [phone]  CHOLESTEROL Know your levels; limit fat & cholesterol in your diet  Lipid Panel     Component Value Date/Time   CHOL 224 (H) 06/29/2016 0330   TRIG 180 (H) 06/29/2016 0330   HDL 64 06/29/2016 0330   CHOLHDL 3.5 06/29/2016 0330   VLDL 36 06/29/2016 0330   LDLCALC 124 (H) 06/29/2016 0330      Many patients benefit from treatment even if their cholesterol is at goal.  Goal: Total Cholesterol (CHOL) less than 160  Goal:  Triglycerides (TRIG) less than 150  Goal:  HDL greater than 40  Goal:  LDL (LDLCALC) less than 100   BLOOD PRESSURE American Stroke Association blood pressure target is less that 120/80 mm/Hg  Your discharge blood pressure is:  BP: 132/72  Monitor your blood pressure  Limit your salt and alcohol intake  Many individuals will require more than one medication for high blood pressure  DIABETES (A1c is a blood sugar average for last 3 months) Goal HGBA1c is under 7% (HBGA1c is blood sugar average for last 3 months)    Diabetes:   Lab Results  Component Value Date   HGBA1C 6.0 (H) 06/29/2016     Your HGBA1c can be lowered with medications, healthy diet, and exercise.  Check your blood sugar as directed by your physician  Call your physician if you experience unexplained or low blood sugars.  PHYSICAL ACTIVITY/REHABILITATION Goal is 30 minutes at least 4 days per week  Activity: Increase activity slowly, Therapies: Physical Therapy: Home Health Return to work:   Activity decreases your risk of heart attack and stroke and makes your heart stronger.  It helps control your weight and blood pressure; helps you relax and can improve your mood.  Participate in a regular exercise program.  Talk with your doctor about the best form of exercise for you (dancing, walking, swimming, cycling).  DIET/WEIGHT Goal is to maintain a healthy weight  Your discharge diet is: Diet Carb Modified Fluid consistency: Thin; Room service appropriate? Yes  liquids Your height is:  Height: 5\' 5"  (165.1 cm) Your current weight is: Weight: 54.8 kg (120 lb 13 oz) Your Body Mass Index (BMI) is:  BMI (Calculated): 20.1  Following the type of diet specifically designed for you will help prevent another stroke.  Your goal weight range is:    Your goal Body Mass Index (BMI) is 19-24.  Healthy food habits can help reduce 3 risk factors for stroke:  High cholesterol, hypertension, and excess weight.  RESOURCES Stroke/Support Group:  Call [facility]  Stroke warning signs and symptoms How to activate emergency medical system (call 911). Medications prescribed at discharge. Need for follow-up after discharge. Personal risk factors for stroke. Pneumonia vaccine given:  Flu vaccine given:  My questions have been answered, the writing is legible, and I understand these instructions.  I will adhere to these goals & educational materials that have been provided to me after my  discharge from the hospital.   __ Bathe/dress with assistance ___ Walk Independently    ___ Shower independently ___ Walk with assistance    ___ Shower with assistance ___ No alcohol     ___ Return to work/school ________  COMMUNITY REFERRALS UPON DISCHARGE:   Home Health:   PT     OT     RN       Agency:  [facility] Phone:  [phone] Medical Equipment/Items Ordered:  Vassie Moselle; bedside commode; tub transfer bench  Agency/Supplier:  [facility]        Phone:  [phone]  GENERAL COMMUNITY RESOURCES FOR PATIENT/FAMILY: Support Groups:  [facility] Stroke Support Group                              Meets every 2nd Thursday of the month (except June, July, August) 3-4 PM                              Inpatient [place] dayroom, [place], [place]:   [address], [place] - ground floor                                                              [phone]                                                              Open Monday-Friday 7:30 AM-6 PM  Special Instructions: Follow-up chemistries to monitor creatinine with PCP. HCTZ currently on hold   My questions have been answered and I understand these instructions. I will adhere to these goals and the provided educational materials after my discharge from the hospital.  Patient/Caregiver Signature _______________________________ Date __________  Clinician Signature _______________________________________ Date __________  Please bring this form and your medication list with you to all your follow-up doctor's appointments.

## 2016-07-11 NOTE — Discharge Summary (Signed)
Discharge summary job [id number]
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
## 2016-07-11 NOTE — Progress Notes (Signed)
Speech Language Pathology Discharge Summary  Patient Details  Name: Anna Hoffman MRN: [id number] Date of Birth: [birth year]  Today's Date: 07/11/2016 SLP Individual Time: 1030-1100 SLP Individual Time Calculation (min): 30 min    Skilled Therapeutic Interventions:  Skilled treatment sessions have focused on pt's cognition. SLP facilitated session by providing Mod I for use of attention, problem solving and awareness. Pt referred to calendar for time management activity this session with Mod I. Pt was left in PT gym with PT. Education completed with pt. No further services are indicated at this time.    Patient has met 3 of 3 long term goals.  Patient to discharge at overall Modified Independent level.  Reasons goals not met:     Clinical Impression/Discharge Summary: Pt with good progress during ST sessions. As a result, pt has mastered 3 of 3 goals. At time of discharge pt desmonstres Mod I with cognition and appears to be at baseline.   Care Partner:  Caregiver Able to Provide Assistance: No     Recommendation:  None      Equipment: None  Reasons for discharge:     Patient/Family Agrees with Progress Made and Goals Achieved: Yes   Function:    Cognition Comprehension Comprehension assist level: Follows complex conversation/direction with extra time/assistive device  Expression   Expression assist level: Expresses complex ideas: With extra time/assistive device  Social Interaction Social Interaction assist level: Interacts appropriately with others with medication or extra time (anti-anxiety, antidepressant).  Problem Solving Problem solving assist level: Solves basic problems with no assist  Memory Memory assist level: Recognizes or recalls 90% of the time/requires cueing < 10% of the time   Beila Purdie 07/11/2016, 11:30 AM

## 2016-07-11 NOTE — Progress Notes (Addendum)
Occupational Therapy Discharge Summary  Patient Details  Name: Anna Hoffman MRN: [id number] Date of Birth: Jun 28, 1935  Today's Date: 07/11/2016 OT Individual Time: 1300-1359 OT Individual Time Calculation (min): 59 min   Session Note:  Pt completed ADL during session.  Modified independent for toileting and selfcare tasks with supervision for shower transfer with the RW.  LOB to the left during transfer into the shower but she was able to regain on her own with use of the RW.  Pt's son present briefly during session.  Discussed DME needs and use at home as well as the need for pt to still have supervision for showering and that she still has an increased fall risk.  Also discussed walker safety with transporting items in the kitchen.  He voiced understanding.  Pt left in recliner with call button and phone in reach at end of session.    Patient has met 11 of 13 long term goals due to improved balance, postural control, ability to compensate for deficits and improved awareness.  Patient to discharge at overall Modified Independent level.  Patient's care partner is independent to provide the necessary physical and cognitive assistance at discharge.    Reasons goals not met: Pt needs supervision for shower transfers, and she also needs supervision for memory to carry over safe techniques for walker usage at times.    Recommendation:  Patient will benefit from ongoing skilled OT services in home health setting to continue to advance functional skills in the area of BADL and Reduce care partner burden.  Pt needs continued HHOT and supervision PRN for safety as she still demonstrates decreased dynamic balance deficits as they relate to selfcare tasks.  Feel she will benefit from continued [place] at discharge to further progress to a safer level with ADLs and IADLs.    Equipment: tub bench, 3:1  Reasons for discharge: treatment goals met and discharge from hospital  Patient/family agrees with progress  made and goals achieved: Yes  OT Discharge Precautions/Restrictions Precautions Precautions: Fall Restrictions Weight Bearing Restrictions: No  Pain Pain Assessment Pain Assessment: No/denies pain ADL  See Function Section of chart for details  Vision/Perception  Vision- History Baseline Vision/History: Wears glasses Wears Glasses: At all times Patient Visual Report: No change from baseline Vision- Assessment Eye Alignment: Impaired (comment) (right eye with slight ptosis) Ocular Range of Motion: Within Functional Limits Tracking/Visual Pursuits: Decreased smoothness of horizontal tracking;Decreased smoothness of vertical tracking;Other (comment) (Pt overshoots ahead of object with tracking) Convergence: Within functional limits Visual Fields: No apparent deficits Additional Comments: She reports no defiicts with watching TV or reading and was able to read small print with her glasses without difficulty even though tracking deficits were noted.   Cognition Overall Cognitive Status: Within Functional Limits for tasks assessed Arousal/Alertness: Awake/alert Orientation Level: Oriented X4 Sustained Attention: Appears intact Selective Attention: Appears intact Alternating Attention: Appears intact Alternating Attention Impairment: Verbal complex;Functional complex Divided Attention: Impaired Memory: Impaired Memory Impairment: Decreased recall of new information Problem Solving: Appears intact Safety/Judgment: Appears intact Comments: Pt still demonstrates decreased carryover of hand placement with sit to stand transitions and will flop into the chair.  Sensation Sensation Light Touch: Appears Intact Stereognosis: Appears Intact Hot/Cold: Appears Intact Proprioception: Appears Intact Coordination Gross Motor Movements are Fluid and Coordinated: Yes Fine Motor Movements are Fluid and Coordinated: Yes Motor  Motor Motor: Abnormal postural alignment and control Motor -  Discharge Observations: She still exhibits LOB to the left at times with dynamic balance if she  gets going too quick. Mobility  Bed Mobility Rolling Right: 6: Modified independent (Device/Increase time) Rolling Left: 6: Modified independent (Device/Increase time) Supine to Sit: 6: Modified independent (Device/Increase time) Sit to Supine: 6: Modified independent (Device/Increase time) Transfers Transfers: Sit to Stand;Stand to Sit Sit to Stand: 6: Modified independent (Device/Increase time) Stand to Sit: 6: Modified independent (Device/Increase time)  Trunk/Postural Assessment  Cervical Assessment Cervical Assessment: Within Functional Limits Thoracic Assessment Thoracic Assessment: Within Functional Limits Lumbar Assessment Lumbar Assessment: Within Functional Limits Postural Control Protective Responses: Still with delayed balance responses to the left if not using assistive device.  Balance Balance Balance Assessed: Yes Dynamic Sitting Balance Dynamic Sitting - Balance Support: No upper extremity supported Dynamic Sitting - Level of Assistance: 6: Modified independent (Device/Increase time) Static Standing Balance Static Standing - Balance Support: No upper extremity supported Static Standing - Level of Assistance: 6: Modified independent (Device/Increase time) Dynamic Standing Balance Dynamic Standing - Balance Support: Bilateral upper extremity supported Dynamic Standing - Level of Assistance: 6: Modified independent (Device/Increase time) Extremity/Trunk Assessment RUE Assessment RUE Assessment: Within Functional Limits LUE Assessment LUE Assessment: Within Functional Limits   See Function Navigator for Current Functional Status.  Goku Harb OTR/L 07/11/2016, 4:30 PM

## 2016-07-12 LAB — GLUCOSE, CAPILLARY: GLUCOSE-CAPILLARY: 98 mg/dL (ref 65–99)

## 2016-07-12 MED ORDER — SALINE SPRAY 0.65 % NA SOLN
1.0000 | NASAL | Status: DC | PRN
  Administered 2016-07-12: 1 via NASAL
  Filled 2016-07-12: qty 44
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
## 2016-07-12 NOTE — Progress Notes (Signed)
Discharged to private car to the care of her son via wheelchair.  Complained of some nausea this morning attributed to drinking coffee.  Nausea resolved at time of discharge.  Alert and oriented.  Morning medications reviewed and documented on discharge summary and next doses discussed.  S/S of stroke and actions to follow reviewed.  Patient stated, "I will call 911 if I have any of those symptoms."  All belongings gathered and transported to private car with patient.

## 2016-07-12 NOTE — Discharge Summary (Signed)
NAMEMAELYS, KINNICK NO.:  [id number]  MEDICAL RECORD NO.:  [id number]  LOCATION:  4M01C                        FACILITY:  [facility]  PHYSICIAN:  Charlett Blake, M.D.DATE OF BIRTH:  12-17-1934  DATE OF ADMISSION:  07/01/2016 DATE OF DISCHARGE:  07/12/2016                              DISCHARGE SUMMARY   DISCHARGE DIAGNOSES: 1. 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 2. Pain management. 3. Hypertension. 4. Diabetes mellitus. 5. Chronic renal insufficiency. 6. Constipation.  HISTORY OF PRESENT ILLNESS:  This is a 81 year old right-handed female with history of hypertension, diabetes mellitus, lives with her son, independent prior to admission.  One-level home, two steps to entry.  CT of the head showed an acute hemorrhage right thalamus measuring 1.7 x 1 cm with surrounding edema.  No significant mass effect.  Chronic small vessel disease.  Placed on intravenous Cleviprex.  Neurology follow up, conservative management, repeat cranial CT scan with no acute changes. The patient did not receive tPA due to [place].  Tolerating a regular diet. Creatinine on admission 1.07.  Physical and occupational therapy ongoing.  The patient was admitted for a comprehensive rehab program.  PAST MEDICAL HISTORY:  See discharge diagnoses.  SOCIAL HISTORY:  Lives with son, independent prior to admission.  FUNCTIONAL STATUS UPON ADMISSION TO REHAB SERVICES:  Moderate assist 55 feet without assistive device; minimal guard sit to stand; min to mod assist activities of daily living.  PHYSICAL EXAMINATION:  VITAL SIGNS:  Blood pressure 158/83, pulse 76, temperature 98, respirations 18. GENERAL:  This was an alert female, in no acute distress, oriented x3. She does exhibit some decreased attention. LUNGS:  Clear to auscultation without wheeze. CARDIAC:  Regular rate and rhythm without murmur. ABDOMEN:  Soft, nontender.  Good bowel sounds.  REHABILITATION  HOSPITAL COURSE:  The patient was admitted to inpatient rehab services with therapies initiated on a 3-hour daily basis, consisting of physical therapy, occupational therapy, speech therapy, and rehabilitation nursing.  The following issues were addressed during the patient's rehabilitation stay.  Pertaining to Ms. Montenegro's right thalamic hemorrhage remained stable, close monitoring of blood pressure. She was using Tylenol on a limited basis for pain.  Currently on Norvasc 5 mg daily as well as Avapro.  The patient had stated in the past she did not always take her blood pressure medication due to cost.  Blood sugars well controlled.  Hemoglobin A1c of 6.  She remained on diabetic diet.  Chronic renal insufficiency, latest creatinine stable with admission creatinine of 1.06.  The patient received weekly collaborative interdisciplinary team conferences.  She was ambulating extended distances rolling walker distance supervision.  Able to go across carpeted tile floors on uneven surfaces.  She can ambulate up to 250 feet working with energy conservation techniques.  Gather belongings for activities of daily living and homemaking.  It was discussed no driving. She was discharged to home after family teaching completed.  DISCHARGE MEDICATIONS: 1. Norvasc 5 mg p.o. daily. 2. Voltaren gel 3 times a day to affected area. 3. Avapro 150 mg p.o. daily. 4. Claritin 10 mg p.o. daily. 5. Protonix 40 mg p.o. daily.  DIET:  Diabetic diet.  FOLLOWUP:  She would follow up Dr. Alysia Penna at the outpatient rehab center as directed; Dr. Leonie Man, Neurology Service call for appointment; Dr. Glendale Chard, medical management.     Lauraine Rinne, P.A.   ______________________________ Charlett Blake, M.D.    DA/MEDQ  D:  07/11/2016  T:  07/12/2016  Job:  [id number]  cc:   Pramod P. Leonie Man, MD Theda Belfast. Baird Cancer, M.D.

## 2016-07-14 ENCOUNTER — Encounter: Payer: Self-pay | Admitting: *Deleted

## 2016-07-14 ENCOUNTER — Telehealth: Payer: Self-pay

## 2016-07-14 ENCOUNTER — Other Ambulatory Visit: Payer: Self-pay | Admitting: *Deleted

## 2016-07-14 NOTE — Patient Outreach (Signed)
[facility]) Care Management  [place]  07/14/2016   Lucila Klecka [facility] [birth year] [id number]  Referral from [facility] of patient with recent hospital discharge; Hospital admission 11/24-11/28/2017 Acute inpatient rehabilitation 11/28-12/04/2016 [employer].  Subjective:  Telephone call to patient who was advised of reason for call.  Patient gave HIPPA verification. States she had been expecting call from [facility].  Voices that she recently had symptoms of face & mouth numbness, nausea and difficulty walking.   States her son took her to hospital emergency room and she was admitted with stroke. States was discharged from [facility] 07/12/2016 to home where her son provides support as needed.  States she is using walker to walk and has not had in falls since discharge to home. Stroke viewed symptoms and action plan reviewed with patient. Voices that she is not having stroke symptoms currently but will call 911 if they occur and states her son would do the same. Patient states she manages her own medications & is taking consistently as instructed by her doctors.  Patient states she plans to have follow up with her doctors as instructed and has been trying to make appointments today but states wait time is long. States she will try again tomorrow to make appointments. States her son or grand daughter will take her to appointments.  Voices that she is suppose to have home health services but has not received any calls today.  Patient voices that she stroke book that she has been reading and does not need any further educational literature. Patient consents to telephonic [facility] care management services.   Objective:  See health assessments & medication list as noted.  Encounter Medications:  Outpatient Encounter Prescriptions as of 07/14/2016  Medication Sig  . acetaminophen (TYLENOL) 325 MG tablet Take 2 tablets (650 mg total) by mouth  every 6 (six) hours as needed for mild pain (or Fever >/= 101).  Marland Kitchen amLODipine (NORVASC) 5 MG tablet Take 1 tablet (5 mg total) by mouth daily.  . Ascorbic Acid (VITAMIN C) 1000 MG tablet Take 1,000 mg by mouth daily.  . diclofenac sodium (VOLTAREN) 1 % GEL Apply 2 g topically 3 (three) times daily.  . fexofenadine (ALLEGRA) 180 MG tablet Take 180 mg by mouth daily.  . irbesartan (AVAPRO) 150 MG tablet Take 1 tablet (150 mg total) by mouth daily.  . Multiple Vitamins-Minerals (STRESS TAB NF PO) Take 1 tablet by mouth daily.  Marland Kitchen omeprazole (PRILOSEC) 40 MG capsule Take 1 capsule (40 mg total) by mouth daily.  . rosuvastatin (CRESTOR) 10 MG tablet Take 1 tablet (10 mg total) by mouth daily.  . vitamin B-12 (CYANOCOBALAMIN) 1000 MCG tablet Take 1,000 mcg by mouth daily.   No facility-administered encounter medications on file as of 07/14/2016.     Functional Status:  In your present state of health, do you have any difficulty performing the following activities: 07/14/2016 07/14/2016  Hearing? - N  Vision? - Y  Difficulty concentrating or making decisions? - N  Walking or climbing stairs? - N  Dressing or bathing? - N  Doing errands, shopping? - Y  Preparing Food and eating ? Y Y  Using the Toilet? N N  In the past six months, have you accidently leaked urine? N N  Do you have problems with loss of bowel control? N N  Managing your Medications? N N  Managing your Finances? N N  Housekeeping or managing your Housekeeping? Tempie Donning  Some  recent data might be hidden    Fall/Depression Screening: PHQ 2/9 Scores 07/14/2016  PHQ - 2 Score 0   Fall Risk  07/14/2016  Falls in the past year? No     Assessment:  -dx Stroke-right thalamic hemorrhage -patient transitioned from acute inpatient admission to inpatient rehabilitation to home with home health services -has support from son & other family members -voices understanding of discharge instructions -pt understands importance of attending  follow up MD appointments -managing own medications -Transition of care follow up appropriate  -pt consents to [facility] services-telephonically (pt signed consent in hospital)  Plan:  [facility] CM Care Plan Problem One   Flowsheet Row Most Recent Value  Care Plan Problem One  Patient is high risk for  readmission to hospital   Role Documenting the Problem One  Care Management Telephonic Coordinator  Care Plan for Problem One  Active  [facility]N Long Term Goal (31-90 days)  No readmission within 14 days of 1st admission  [facility]N Long Term Goal Start Date  07/14/16  Interventions for Problem One Long Term Goal  Advise pt of attending MD appointments for follow up after hospital stay, taking meds as instructed, when tio call 911 & MD  [facility] CM Short Term Goal #1 (0-30 days)  Patient will report that she is taking meds as instructed,  will report reasond for taking medications [Will report within 14 days reasons for taking meds&is taking]  [facility]N CM Short Term Goal #1 Start Date  07/14/16  Interventions for Short Term Goal #1  Review meds with patient, advise of importance of taking consistently as ordered by MD  [facility] CM Short Term Goal #2 (0-30 days)  Patient will reports 3 signs of stroke within 14 days  [facility]N CM Short Term Goal #2 Start Date  07/14/16  Interventions for Short Term Goal #2  Teach patient signs of stroke  [facility]N CM Short Term Goal #3 (0-30 days)  Patient will report appointments made for hospital follow up within 14 days  [facility]N CM Short Term Goal #3 Start Date  07/14/16  Interventions for Short Tern Goal #3  Will advise importance of hospital follow up appointments      Will follow up to complete health assessments. Care coordination call regarding home health services. Follow up with patient regarding dates for MD appointments. Patients agrees to set time for next appointment. Send communication to MD. Send to care management assistant -active case with telephonic.   [name], RN BSN [place]  Management Coordinator [facility] Care Management  [phone]

## 2016-07-14 NOTE — Telephone Encounter (Signed)
Transitional care call completed, appointment confirmed, no packet sent due to short notice  Transitional Care Questions  Questions for our staff to ask patients on Transitional care 48 hour phone call:   1. Are you/is patient experiencing any problems since coming home? No Are there any questions regarding any aspect of care? No  2. Are there any questions regarding medications administration/dosing? No Are meds being taken as prescribed? Yes Patient should review meds with caller to confirm   3. Have there been any falls? no  4. Has Home Health been to the house and/or have they contacted you? [facility] Nurse called 07/14/2016 If not, have you tried to contact them? Can we help you contact them?No   5. Are bowels and bladder emptying properly? Yes Are there any unexpected incontinence issues? No If applicable, is patient following bowel/bladder programs?  6. Any fevers, problems with breathing, unexpected pain? No   7. Are there any skin problems or new areas of breakdown? No  8. Has the patient/family member arranged specialty MD follow up (ie cardiology/neurology/renal/surgical/etc)?  Patient wants to see [place] first  Can we help arrange? no   9. Does the patient need any other services or support that we can help arrange? No  10. Are caregivers following through as expected in assisting the patient? Yes   11. Has the patient quit smoking, drinking alcohol, or using drugs as recommended? Patient does not do those things

## 2016-07-14 NOTE — Telephone Encounter (Signed)
Attempted to call patient, no answer 

## 2016-07-14 NOTE — Progress Notes (Addendum)
Social Work Discharge Note  The overall goal for the admission was met for:   Discharge location: Yes - to her home with son  Length of Stay: Yes - 11 days  Discharge activity level: Yes - modified independent  Home/community participation: Yes  Services provided included: MD, RD, PT, OT, SLP, RN, Pharmacy and [place]: Private Insurance: [employer]  Follow-up services arranged: Home Health: PT/OT/RN, DME: rolling walker; bedside commode; tub bench and Patient/Family request agency HH: [facility], DME: no preference - used [facility]  Comments (or additional information): Pt to d/c to her home with her son.  He will provide supervision when pt is bathing.  Other family will assist with MD appts and other errands.  CSW also referred pt to [facility]).  CSW strongly encouraged pt to use the check she had a home to get her medications at d/c and told her about [place] to help save pt some money.  She recognized that she needs to get medications to avoid another stroke.  CSW explained that CSW cannot assist financially with meds due to pt having insurance, but [facility] may be able to support her with this.    Patient/Family verbalized understanding of follow-up arrangements: Yes  Individual responsible for coordination of the follow-up plan: pt with support from family  Confirmed correct DME delivered: Trey Sailors 07/14/2016    Anna Hoffman, Silvestre Mesi

## 2016-07-15 ENCOUNTER — Other Ambulatory Visit: Payer: Self-pay | Admitting: *Deleted

## 2016-07-15 ENCOUNTER — Telehealth: Payer: Self-pay | Admitting: Physical Medicine & Rehabilitation

## 2016-07-15 ENCOUNTER — Encounter: Payer: Self-pay | Admitting: *Deleted

## 2016-07-15 DIAGNOSIS — M545 Low back pain: Secondary | ICD-10-CM | POA: Diagnosis not present

## 2016-07-15 DIAGNOSIS — M1991 Primary osteoarthritis, unspecified site: Secondary | ICD-10-CM | POA: Diagnosis not present

## 2016-07-15 DIAGNOSIS — R42 Dizziness and giddiness: Secondary | ICD-10-CM | POA: Diagnosis not present

## 2016-07-15 DIAGNOSIS — R2681 Unsteadiness on feet: Secondary | ICD-10-CM | POA: Diagnosis not present

## 2016-07-15 DIAGNOSIS — I69298 Other sequelae of other nontraumatic intracranial hemorrhage: Secondary | ICD-10-CM | POA: Diagnosis not present

## 2016-07-15 DIAGNOSIS — E119 Type 2 diabetes mellitus without complications: Secondary | ICD-10-CM | POA: Diagnosis not present

## 2016-07-15 DIAGNOSIS — I10 Essential (primary) hypertension: Secondary | ICD-10-CM | POA: Diagnosis not present

## 2016-07-15 NOTE — Patient Outreach (Signed)
[facility]) Care Management  07/15/2016  Anna Hoffman [facility] 01-01-1935 [id number]  Care coordination call to Social worker who set up home health services for patient upon discharge to home.   Clinical social worker advised that services were set up with [facility]; services started today..  Plan: Will follow up with patient.  [name], RN BSN [place] Management Coordinator [facility] Care Management  [phone]

## 2016-07-15 NOTE — Telephone Encounter (Signed)
Approval given

## 2016-07-15 NOTE — Telephone Encounter (Signed)
Olean Ree RN with [facility] needs to get verbal to see patient 1w5 for medication education and cardiac disease education.  Please call Arbie Cookey at [phone].

## 2016-07-16 ENCOUNTER — Encounter: Payer: Medicare Other | Attending: Physical Medicine & Rehabilitation | Admitting: Physical Medicine & Rehabilitation

## 2016-07-16 ENCOUNTER — Encounter: Payer: Self-pay | Admitting: Physical Medicine & Rehabilitation

## 2016-07-16 VITALS — BP 118/74 | HR 81 | Resp 14

## 2016-07-16 DIAGNOSIS — E119 Type 2 diabetes mellitus without complications: Secondary | ICD-10-CM | POA: Diagnosis not present

## 2016-07-16 DIAGNOSIS — I619 Nontraumatic intracerebral hemorrhage, unspecified: Secondary | ICD-10-CM | POA: Insufficient documentation

## 2016-07-16 DIAGNOSIS — M25561 Pain in right knee: Secondary | ICD-10-CM | POA: Insufficient documentation

## 2016-07-16 DIAGNOSIS — Z5189 Encounter for other specified aftercare: Secondary | ICD-10-CM | POA: Insufficient documentation

## 2016-07-16 DIAGNOSIS — R269 Unspecified abnormalities of gait and mobility: Secondary | ICD-10-CM | POA: Insufficient documentation

## 2016-07-16 DIAGNOSIS — I69354 Hemiplegia and hemiparesis following cerebral infarction affecting left non-dominant side: Secondary | ICD-10-CM

## 2016-07-16 DIAGNOSIS — G8929 Other chronic pain: Secondary | ICD-10-CM

## 2016-07-16 DIAGNOSIS — I10 Essential (primary) hypertension: Secondary | ICD-10-CM | POA: Diagnosis not present

## 2016-07-16 NOTE — Progress Notes (Signed)
Subjective:    Patient ID: Anna Hoffman, female    DOB: [birth year], 81 y.o.   MRN: [id number]  HPI 81 year old right-handed female with history of hypertension, diabetes mellitus presents for transitional care management after receiving CIR for right thalamic hemorrhage.  DATE OF ADMISSION:  07/01/2016 DATE OF DISCHARGE:  07/12/2016  Since discharge, pt states she is doing well.  Pt has pain in her right knee, but that has improved.  Her BP is stable. She states she does not have an appointment to see Neurology. She has an appointment to see PCP end of January.   Informed by nurse that pt reported care from her sons regarding obtaining meds, however, pt does not report issues.  DME: Bedside commode, shower bench Mobility: Walker at all times Therapies: 2-3/week.  Pain Inventory Average Pain 0 Pain Right Now 0 My pain is n/a  In the last 24 hours, has pain interfered with the following? General activity 4 Relation with others 4 Enjoyment of life 4 What TIME of day is your pain at its worst? night Sleep (in general) Good  Pain is worse with: standing Pain improves with: n/a Relief from Meds: n/a  Mobility walk without assistance use a walker ability to climb steps?  yes do you drive?  no transfers alone Do you have any goals in this area?  yes  Function what is your job? [employer] grandparent disabled: date disabled nov 25 retired Do you have any goals in this area?  yes  Neuro/Psych bladder control problems  Prior Studies Any changes since last visit?  no  Physicians involved in your care Any changes since last visit?  no   Family History  Problem Relation Age of Onset  . Prostate cancer Father    Social History   Social History  . Marital status: Widowed    Spouse name: N/A  . Number of children: N/A  . Years of education: N/A   Social History Main Topics  . Smoking status: Never Smoker  . Smokeless tobacco: Never Used  . Alcohol use No  . Drug  use: No  . Sexual activity: No   Other Topics Concern  . None   Social History Narrative  . None   Past Surgical History:  Procedure Laterality Date  . ABDOMINAL HYSTERECTOMY    . ESOPHAGOGASTRODUODENOSCOPY N/A 09/01/2014   Procedure: ESOPHAGOGASTRODUODENOSCOPY (EGD);  Surgeon: Lear Ng, MD;  Location: [facility];  Service: Endoscopy;  Laterality: N/A;  . HIATAL HERNIA REPAIR N/A 09/04/2014   Procedure: LAPAROSCOPIC REPAIR OF HIATAL HERNIA;  Surgeon: Excell Seltzer, MD;  Location: [facility];  Service: General;  Laterality: N/A;  With MESH  . IR GENERIC HISTORICAL  04/10/2016   IR US GUIDE VASC ACCESS RIGHT 04/10/2016 Corrie Mckusick, DO [facility]  . IR GENERIC HISTORICAL  04/10/2016   IR ANGIOGRAM SELECTIVE EACH ADDITIONAL VESSEL 04/10/2016 Corrie Mckusick, DO [facility]  . IR GENERIC HISTORICAL  04/10/2016   IR ANGIOGRAM SELECTIVE EACH ADDITIONAL VESSEL 04/10/2016 Corrie Mckusick, DO [facility]  . IR GENERIC HISTORICAL  04/10/2016   IR EMBO TUMOR ORGAN ISCHEMIA INFARCT INC GUIDE ROADMAPPING 04/10/2016 Corrie Mckusick, DO [facility]  . IR GENERIC HISTORICAL  04/10/2016   IR ANGIOGRAM SELECTIVE EACH ADDITIONAL VESSEL 04/10/2016 Corrie Mckusick, DO [facility]  . IR GENERIC HISTORICAL  04/10/2016   IR ANGIOGRAM SELECTIVE EACH ADDITIONAL VESSEL 04/10/2016 Corrie Mckusick, DO [facility]  . IR GENERIC HISTORICAL  04/10/2016   IR RENAL SELECTIVE  UNI  INC S&I MOD SED 04/10/2016 Corrie Mckusick, DO [facility]  . KNEE SURGERY     Past Medical History:  Diagnosis Date  . Diabetes mellitus without complication ([place])   . Hypertension   . Hypokalemia    BP 118/74   Pulse 81   Resp 14   SpO2 94%   Opioid Risk Score:   Fall Risk Score:  `1  Depression screen PHQ 2/9  Depression screen [facility] 2/9 07/16/2016 07/14/2016  Decreased Interest 0 0  Down, Depressed, Hopeless 0 0  PHQ - 2 Score 0 0  Altered sleeping 0 -  Tired, decreased energy 0 -  Change in appetite 0 -  Feeling bad or failure about  yourself  0 -  Trouble concentrating 3 -  Moving slowly or fidgety/restless 0 -  Suicidal thoughts 0 -  PHQ-9 Score 3 -     Review of Systems  Constitutional:       Weight loss  HENT: Negative.   Eyes: Negative.   Respiratory: Positive for cough.   Cardiovascular: Negative.   Gastrointestinal: Negative.   Endocrine: Negative.   Genitourinary: Negative.   Musculoskeletal: Positive for arthralgias, back pain and gait problem.  Skin: Negative.   Allergic/Immunologic: Negative.   Neurological: Positive for weakness.  Hematological: Negative.   Psychiatric/Behavioral: Negative.   All other systems reviewed and are negative.      Objective:   Physical Exam General:NAD. Well-developed.  Psych: Mood and affect are appropriate. Verbose. Heart: RRR. No JVD. Lungs: clear without wheezes. Unlabored Abdomen: Positive bowel sounds, soft  Musc: No edema, no tenderness. arthritic b/l hand.  Gait: Slow cadence with external rotation, with decreased foot clearance Skin: Warm and dry Neurologic: Cranial nerves II through XII grossly intact Motor: 5/5 RUE/RLE LUE: 4+/5 proximal to distal LLE: 4/5 proximal to distal No increase in tone noted. Sensation intact to light touch    Assessment & Plan:  81 year old right-handed female with history of hypertension, diabetes mellitus presents for transitional care management after receiving CIR for right thalamic hemorrhage.  1.  Late effects right thalamic hemorrhage   Cont therapies  Pt needs to make follow up appointment with Neurology  Cont meds, pt notes she will be more compliant  Pt is also trying to reduce life stressors  DME ordered - shower chair  2. Pain in right knee  Cont voltaren gel  3. HTN  Cont meds  4. Abnormality of gait  Cont walker for safety  Cont therapies  Meds reviewed Referrals reviewed, pt needs appointment with Neurology All questions answered

## 2016-07-16 NOTE — Patient Instructions (Signed)
Please schedule follow up appointment with Neurology

## 2016-07-18 DIAGNOSIS — M1991 Primary osteoarthritis, unspecified site: Secondary | ICD-10-CM | POA: Diagnosis not present

## 2016-07-18 DIAGNOSIS — E119 Type 2 diabetes mellitus without complications: Secondary | ICD-10-CM | POA: Diagnosis not present

## 2016-07-18 DIAGNOSIS — M545 Low back pain: Secondary | ICD-10-CM | POA: Diagnosis not present

## 2016-07-18 DIAGNOSIS — I69298 Other sequelae of other nontraumatic intracranial hemorrhage: Secondary | ICD-10-CM | POA: Diagnosis not present

## 2016-07-18 DIAGNOSIS — R42 Dizziness and giddiness: Secondary | ICD-10-CM | POA: Diagnosis not present

## 2016-07-18 DIAGNOSIS — I10 Essential (primary) hypertension: Secondary | ICD-10-CM | POA: Diagnosis not present

## 2016-07-18 DIAGNOSIS — R2681 Unsteadiness on feet: Secondary | ICD-10-CM | POA: Diagnosis not present

## 2016-07-21 ENCOUNTER — Other Ambulatory Visit: Payer: Self-pay | Admitting: Pharmacist

## 2016-07-21 DIAGNOSIS — I69298 Other sequelae of other nontraumatic intracranial hemorrhage: Secondary | ICD-10-CM | POA: Diagnosis not present

## 2016-07-21 DIAGNOSIS — R2681 Unsteadiness on feet: Secondary | ICD-10-CM | POA: Diagnosis not present

## 2016-07-21 DIAGNOSIS — M545 Low back pain: Secondary | ICD-10-CM | POA: Diagnosis not present

## 2016-07-21 DIAGNOSIS — I10 Essential (primary) hypertension: Secondary | ICD-10-CM | POA: Diagnosis not present

## 2016-07-21 DIAGNOSIS — E119 Type 2 diabetes mellitus without complications: Secondary | ICD-10-CM | POA: Diagnosis not present

## 2016-07-21 DIAGNOSIS — R42 Dizziness and giddiness: Secondary | ICD-10-CM | POA: Diagnosis not present

## 2016-07-21 DIAGNOSIS — M1991 Primary osteoarthritis, unspecified site: Secondary | ICD-10-CM | POA: Diagnosis not present

## 2016-07-21 NOTE — Patient Outreach (Signed)
[place] [facility]) Care Management  07/21/2016  [name] [facility] [birth year] [id number]  Unsuccessful phone outreach attempt to patient.  HIPAA compliant voicemail left for patient.    Patient was referred to [place] by [facility] for medication assistance evaluation.   Plan:  If no return call from patient, will make second outreach next week.   [name], PharmD, [place] [phone]

## 2016-07-22 ENCOUNTER — Other Ambulatory Visit: Payer: Self-pay | Admitting: *Deleted

## 2016-07-22 DIAGNOSIS — R2681 Unsteadiness on feet: Secondary | ICD-10-CM | POA: Diagnosis not present

## 2016-07-22 DIAGNOSIS — M545 Low back pain: Secondary | ICD-10-CM | POA: Diagnosis not present

## 2016-07-22 DIAGNOSIS — I69298 Other sequelae of other nontraumatic intracranial hemorrhage: Secondary | ICD-10-CM | POA: Diagnosis not present

## 2016-07-22 DIAGNOSIS — I10 Essential (primary) hypertension: Secondary | ICD-10-CM | POA: Diagnosis not present

## 2016-07-22 DIAGNOSIS — M1991 Primary osteoarthritis, unspecified site: Secondary | ICD-10-CM | POA: Diagnosis not present

## 2016-07-22 DIAGNOSIS — E119 Type 2 diabetes mellitus without complications: Secondary | ICD-10-CM | POA: Diagnosis not present

## 2016-07-22 DIAGNOSIS — R42 Dizziness and giddiness: Secondary | ICD-10-CM | POA: Diagnosis not present

## 2016-07-22 NOTE — Patient Outreach (Signed)
[place] [facility]) Care Management  07/22/2016  [name] Nov 12, 1934 [id number]  Telephone call # 2 for transition of care; message left on voice mail requesting call back.  Plan:  Follow up call.   [name], RN BSN [place] Management Coordinator [facility] Care Management  [phone]

## 2016-07-23 ENCOUNTER — Other Ambulatory Visit: Payer: Self-pay | Admitting: Pharmacist

## 2016-07-23 ENCOUNTER — Other Ambulatory Visit: Payer: Self-pay | Admitting: *Deleted

## 2016-07-23 NOTE — Patient Outreach (Signed)
[place] [facility]) Care Management  [place]   07/23/2016  Lakyla Biswas [facility] 1934/10/30 [id number]  Subjective:  Second outreach phone call to patient was successful.  Patient answered call and verified HIPAA details.    Patient was referred to [place] by [facility] for medication assistance evaluation.   Patient reports prior to her recent hospital admission she was on Tribenzor.  She reports she obtained her discharge prescriptions and her medications are more affordable now.   Patient reports she remembers to take her medications and her son reminds her if needed.  She reports she doesn't use a pill planner at this time.  Patient was willing to review her medication list over the phone and denies pharmacy related questions.    Her past medical history is significant for hypertension, hyperlipidemia, and recent intracerebral hemorrhage for which she was admitted 11/24-11/28/17 and then in-patient rehab 11/28-12/9/17.    Objective:   Current Medications: Current Outpatient Prescriptions  Medication Sig Dispense Refill  . amLODipine (NORVASC) 5 MG tablet Take 1 tablet (5 mg total) by mouth daily. 30 tablet 1  . Ascorbic Acid (VITAMIN C) 1000 MG tablet Take 1,000 mg by mouth daily.    . diclofenac sodium (VOLTAREN) 1 % GEL Apply 2 g topically 3 (three) times daily. 1 Tube 1  . fexofenadine (ALLEGRA) 180 MG tablet Take 180 mg by mouth daily.    . irbesartan (AVAPRO) 150 MG tablet Take 1 tablet (150 mg total) by mouth daily. 30 tablet 1  . Multiple Vitamins-Minerals (STRESS TAB NF PO) Take 1 tablet by mouth daily.    Marland Kitchen omeprazole (PRILOSEC) 40 MG capsule Take 1 capsule (40 mg total) by mouth daily. 30 capsule 1  . rosuvastatin (CRESTOR) 10 MG tablet Take 1 tablet (10 mg total) by mouth daily. 30 tablet 1  . vitamin B-12 (CYANOCOBALAMIN) 1000 MCG tablet Take 1,000 mcg by mouth daily.    Marland Kitchen acetaminophen (TYLENOL) 325 MG tablet Take 2 tablets (650  mg total) by mouth every 6 (six) hours as needed for mild pain (or Fever >/= 101). (Patient not taking: Reported on 07/23/2016)     No current facility-administered medications for this visit.     Functional Status: In your present state of health, do you have any difficulty performing the following activities: 07/14/2016 07/14/2016  Hearing? - N  Vision? - Y  Difficulty concentrating or making decisions? - N  Walking or climbing stairs? - N  Dressing or bathing? - N  Doing errands, shopping? - Y  Preparing Food and eating ? Y Y  Using the Toilet? N N  In the past six months, have you accidently leaked urine? N N  Do you have problems with loss of bowel control? N N  Managing your Medications? N N  Managing your Finances? N N  Housekeeping or managing your Housekeeping? Tempie Donning  Some recent data might be hidden    Fall/Depression Screening: PHQ 2/9 Scores 07/16/2016 07/14/2016  PHQ - 2 Score 0 0  PHQ- 9 Score 3 -    Assessment:  Medications reviewed by comparing active medication list in chart with patient report.  Drugs sorted by system:  Cardiovascular: -amlodipine  -irbesartan  -rosuvastatin   Pulmonary/Allergy: -fexofenadine   Gastrointestinal: -omeprazole  Pain: -acetaminophen -diclofenac gel   Vitamins/Minerals: -vitamin c  -cyanocobalamin -multivitamin   Other issues noted:  Prior to admission patient was on Tribenzor (olmesartan, amlodipine, hydrochlorothiazide). Patient was discharged on irbesartan and  amlodipine.    She was not discharged on hydrochlorothiazide---consider monitoring patient to see if this therapy is needed at a later date.    Medication assistance:  Tribenzor was Tier 3 on her MA-PDP, irbesartan and amlodipine are Tier 1.  Patient reports irbesartan and amlodipine are affordable.      Plan:  Will not open pharmacy case as patient denies pharmacy needs at this time.   Will route this note to patient's PCP.    Patient aware she  can contact [place] if needed in the future.    [name], PharmD, [place] [phone]

## 2016-07-23 NOTE — Patient Outreach (Signed)
[place] [facility]) Care Management  07/23/2016  Anna Hoffman [facility] 06-Jun-1935 [id number]  Telephone call to patient; left message on voice mail requesting call back.  Plan: Will follow up. [name], RN BSN [place] Management Coordinator [facility] Care Management  [phone]

## 2016-07-25 DIAGNOSIS — R42 Dizziness and giddiness: Secondary | ICD-10-CM | POA: Diagnosis not present

## 2016-07-25 DIAGNOSIS — R2681 Unsteadiness on feet: Secondary | ICD-10-CM | POA: Diagnosis not present

## 2016-07-25 DIAGNOSIS — M545 Low back pain: Secondary | ICD-10-CM | POA: Diagnosis not present

## 2016-07-25 DIAGNOSIS — M1991 Primary osteoarthritis, unspecified site: Secondary | ICD-10-CM | POA: Diagnosis not present

## 2016-07-25 DIAGNOSIS — I69298 Other sequelae of other nontraumatic intracranial hemorrhage: Secondary | ICD-10-CM | POA: Diagnosis not present

## 2016-07-25 DIAGNOSIS — I10 Essential (primary) hypertension: Secondary | ICD-10-CM | POA: Diagnosis not present

## 2016-07-25 DIAGNOSIS — E119 Type 2 diabetes mellitus without complications: Secondary | ICD-10-CM | POA: Diagnosis not present

## 2016-07-25 MED FILL — AMLODIPINE BESYLATE 10 MG T: 10 | 90 days supply | Qty: 90 | Fill #0

## 2016-07-25 MED FILL — OMEPRAZOLE DR 20 MG CAPSULE: 20 | 90 days supply | Qty: 90 | Fill #0

## 2016-07-25 MED FILL — ROSUVASTATIN CALCIUM 20 MG: 20 | 90 days supply | Qty: 90 | Fill #0

## 2016-07-29 DIAGNOSIS — I10 Essential (primary) hypertension: Secondary | ICD-10-CM | POA: Diagnosis not present

## 2016-07-29 DIAGNOSIS — I69298 Other sequelae of other nontraumatic intracranial hemorrhage: Secondary | ICD-10-CM | POA: Diagnosis not present

## 2016-07-29 DIAGNOSIS — M545 Low back pain: Secondary | ICD-10-CM | POA: Diagnosis not present

## 2016-07-29 DIAGNOSIS — R2681 Unsteadiness on feet: Secondary | ICD-10-CM | POA: Diagnosis not present

## 2016-07-29 DIAGNOSIS — E119 Type 2 diabetes mellitus without complications: Secondary | ICD-10-CM | POA: Diagnosis not present

## 2016-07-29 DIAGNOSIS — R42 Dizziness and giddiness: Secondary | ICD-10-CM | POA: Diagnosis not present

## 2016-07-29 DIAGNOSIS — M1991 Primary osteoarthritis, unspecified site: Secondary | ICD-10-CM | POA: Diagnosis not present

## 2016-07-30 DIAGNOSIS — I10 Essential (primary) hypertension: Secondary | ICD-10-CM | POA: Diagnosis not present

## 2016-07-30 DIAGNOSIS — M1991 Primary osteoarthritis, unspecified site: Secondary | ICD-10-CM | POA: Diagnosis not present

## 2016-07-30 DIAGNOSIS — I69298 Other sequelae of other nontraumatic intracranial hemorrhage: Secondary | ICD-10-CM | POA: Diagnosis not present

## 2016-07-30 DIAGNOSIS — M545 Low back pain: Secondary | ICD-10-CM | POA: Diagnosis not present

## 2016-07-30 DIAGNOSIS — R42 Dizziness and giddiness: Secondary | ICD-10-CM | POA: Diagnosis not present

## 2016-07-30 DIAGNOSIS — R2681 Unsteadiness on feet: Secondary | ICD-10-CM | POA: Diagnosis not present

## 2016-07-30 DIAGNOSIS — E119 Type 2 diabetes mellitus without complications: Secondary | ICD-10-CM | POA: Diagnosis not present

## 2016-07-31 DIAGNOSIS — M1991 Primary osteoarthritis, unspecified site: Secondary | ICD-10-CM | POA: Diagnosis not present

## 2016-07-31 DIAGNOSIS — I10 Essential (primary) hypertension: Secondary | ICD-10-CM | POA: Diagnosis not present

## 2016-07-31 DIAGNOSIS — I69298 Other sequelae of other nontraumatic intracranial hemorrhage: Secondary | ICD-10-CM | POA: Diagnosis not present

## 2016-07-31 DIAGNOSIS — R2681 Unsteadiness on feet: Secondary | ICD-10-CM | POA: Diagnosis not present

## 2016-07-31 DIAGNOSIS — R42 Dizziness and giddiness: Secondary | ICD-10-CM | POA: Diagnosis not present

## 2016-07-31 DIAGNOSIS — E119 Type 2 diabetes mellitus without complications: Secondary | ICD-10-CM | POA: Diagnosis not present

## 2016-07-31 DIAGNOSIS — M545 Low back pain: Secondary | ICD-10-CM | POA: Diagnosis not present

## 2016-08-01 DIAGNOSIS — I10 Essential (primary) hypertension: Secondary | ICD-10-CM | POA: Diagnosis not present

## 2016-08-01 DIAGNOSIS — R42 Dizziness and giddiness: Secondary | ICD-10-CM | POA: Diagnosis not present

## 2016-08-01 DIAGNOSIS — R2681 Unsteadiness on feet: Secondary | ICD-10-CM | POA: Diagnosis not present

## 2016-08-01 DIAGNOSIS — I69298 Other sequelae of other nontraumatic intracranial hemorrhage: Secondary | ICD-10-CM | POA: Diagnosis not present

## 2016-08-01 DIAGNOSIS — M1991 Primary osteoarthritis, unspecified site: Secondary | ICD-10-CM | POA: Diagnosis not present

## 2016-08-01 DIAGNOSIS — E119 Type 2 diabetes mellitus without complications: Secondary | ICD-10-CM | POA: Diagnosis not present

## 2016-08-01 DIAGNOSIS — M545 Low back pain: Secondary | ICD-10-CM | POA: Diagnosis not present

## 2016-08-05 DIAGNOSIS — M170 Bilateral primary osteoarthritis of knee: Secondary | ICD-10-CM | POA: Diagnosis not present

## 2016-08-06 DIAGNOSIS — R2681 Unsteadiness on feet: Secondary | ICD-10-CM | POA: Diagnosis not present

## 2016-08-06 DIAGNOSIS — M1991 Primary osteoarthritis, unspecified site: Secondary | ICD-10-CM | POA: Diagnosis not present

## 2016-08-06 DIAGNOSIS — I10 Essential (primary) hypertension: Secondary | ICD-10-CM | POA: Diagnosis not present

## 2016-08-06 DIAGNOSIS — E119 Type 2 diabetes mellitus without complications: Secondary | ICD-10-CM | POA: Diagnosis not present

## 2016-08-06 DIAGNOSIS — R42 Dizziness and giddiness: Secondary | ICD-10-CM | POA: Diagnosis not present

## 2016-08-06 DIAGNOSIS — I69298 Other sequelae of other nontraumatic intracranial hemorrhage: Secondary | ICD-10-CM | POA: Diagnosis not present

## 2016-08-06 DIAGNOSIS — M545 Low back pain: Secondary | ICD-10-CM | POA: Diagnosis not present

## 2016-08-07 ENCOUNTER — Encounter: Payer: Self-pay | Admitting: Interventional Radiology

## 2016-08-07 ENCOUNTER — Ambulatory Visit: Payer: Self-pay | Admitting: *Deleted

## 2016-08-07 ENCOUNTER — Encounter: Payer: Self-pay | Admitting: *Deleted

## 2016-08-07 DIAGNOSIS — I10 Essential (primary) hypertension: Secondary | ICD-10-CM | POA: Diagnosis not present

## 2016-08-07 DIAGNOSIS — M545 Low back pain: Secondary | ICD-10-CM | POA: Diagnosis not present

## 2016-08-07 DIAGNOSIS — E119 Type 2 diabetes mellitus without complications: Secondary | ICD-10-CM | POA: Diagnosis not present

## 2016-08-07 DIAGNOSIS — R42 Dizziness and giddiness: Secondary | ICD-10-CM | POA: Diagnosis not present

## 2016-08-07 DIAGNOSIS — I69298 Other sequelae of other nontraumatic intracranial hemorrhage: Secondary | ICD-10-CM | POA: Diagnosis not present

## 2016-08-07 DIAGNOSIS — R2681 Unsteadiness on feet: Secondary | ICD-10-CM | POA: Diagnosis not present

## 2016-08-07 DIAGNOSIS — M1991 Primary osteoarthritis, unspecified site: Secondary | ICD-10-CM | POA: Diagnosis not present

## 2016-08-08 ENCOUNTER — Other Ambulatory Visit: Payer: Self-pay | Admitting: *Deleted

## 2016-08-08 DIAGNOSIS — R42 Dizziness and giddiness: Secondary | ICD-10-CM | POA: Diagnosis not present

## 2016-08-08 DIAGNOSIS — I10 Essential (primary) hypertension: Secondary | ICD-10-CM | POA: Diagnosis not present

## 2016-08-08 DIAGNOSIS — M1991 Primary osteoarthritis, unspecified site: Secondary | ICD-10-CM | POA: Diagnosis not present

## 2016-08-08 DIAGNOSIS — I69298 Other sequelae of other nontraumatic intracranial hemorrhage: Secondary | ICD-10-CM | POA: Diagnosis not present

## 2016-08-08 DIAGNOSIS — R2681 Unsteadiness on feet: Secondary | ICD-10-CM | POA: Diagnosis not present

## 2016-08-08 DIAGNOSIS — E119 Type 2 diabetes mellitus without complications: Secondary | ICD-10-CM | POA: Diagnosis not present

## 2016-08-08 DIAGNOSIS — M545 Low back pain: Secondary | ICD-10-CM | POA: Diagnosis not present

## 2016-08-08 MED FILL — IRBESARTAN 150 MG TABLET: 150 | 30 days supply | Qty: 30 | Fill #1

## 2016-08-08 NOTE — Patient Outreach (Signed)
[place] [facility]) Care Management  08/08/2016  Kambrie Eddleman [facility] Dec 01, 1934 [id number]  Transition of Care call #3; Telephone call to patient who gave HIPPA verification. Patient voices that she is doing well & has not had to go to emergency room or be admitted to hospital since discharge from inpatient rehabilitation.   Patient reports that she continues to receive home health services from [facility] and participates with visits from RN and physical therapist. States she continues to use walker to get around.  Patient reports that she has attended MD visit at primary care office 12/22. States she has appointment to see neurologist -Dr. Leonie Man 02/08 and appointment with Dr. Posey Pronto 1/30. Voices that her son & daughter provide transportation to medical appointments. .   Patient voices that she is consistently taking medications as prescribed by her doctors and has not had any change in medications. Voices understanding of importance of taking her medications as it relates to control & management of her health conditions.   Patient understands early reporting of physical symptoms & plans to call 911 if she experiences signs of stroke.   Care Plans goals have been met. See updated plan as noted.   Patient voicing good working knowledge of self care of chronic/acute conditions,   Patient has strong family support. Patient agrees to close out of case of transition of care. Has contact [place] number if case management needs.   Plan: Close case. Send MD closure letter.  [name], RN BSN [place] Management Coordinator [facility] Care Management  [phone]

## 2016-08-11 DIAGNOSIS — M545 Low back pain: Secondary | ICD-10-CM | POA: Diagnosis not present

## 2016-08-11 DIAGNOSIS — M1991 Primary osteoarthritis, unspecified site: Secondary | ICD-10-CM | POA: Diagnosis not present

## 2016-08-11 DIAGNOSIS — R2681 Unsteadiness on feet: Secondary | ICD-10-CM | POA: Diagnosis not present

## 2016-08-11 DIAGNOSIS — I10 Essential (primary) hypertension: Secondary | ICD-10-CM | POA: Diagnosis not present

## 2016-08-11 DIAGNOSIS — E119 Type 2 diabetes mellitus without complications: Secondary | ICD-10-CM | POA: Diagnosis not present

## 2016-08-11 DIAGNOSIS — I69298 Other sequelae of other nontraumatic intracranial hemorrhage: Secondary | ICD-10-CM | POA: Diagnosis not present

## 2016-08-11 DIAGNOSIS — R42 Dizziness and giddiness: Secondary | ICD-10-CM | POA: Diagnosis not present

## 2016-08-11 DIAGNOSIS — I639 Cerebral infarction, unspecified: Secondary | ICD-10-CM | POA: Diagnosis not present

## 2016-08-12 ENCOUNTER — Encounter: Payer: Self-pay | Admitting: *Deleted

## 2016-08-13 DIAGNOSIS — E119 Type 2 diabetes mellitus without complications: Secondary | ICD-10-CM | POA: Diagnosis not present

## 2016-08-13 DIAGNOSIS — I69298 Other sequelae of other nontraumatic intracranial hemorrhage: Secondary | ICD-10-CM | POA: Diagnosis not present

## 2016-08-13 DIAGNOSIS — R42 Dizziness and giddiness: Secondary | ICD-10-CM | POA: Diagnosis not present

## 2016-08-13 DIAGNOSIS — H10413 Chronic giant papillary conjunctivitis, bilateral: Secondary | ICD-10-CM | POA: Diagnosis not present

## 2016-08-13 DIAGNOSIS — R2681 Unsteadiness on feet: Secondary | ICD-10-CM | POA: Diagnosis not present

## 2016-08-13 DIAGNOSIS — M1991 Primary osteoarthritis, unspecified site: Secondary | ICD-10-CM | POA: Diagnosis not present

## 2016-08-13 DIAGNOSIS — H26491 Other secondary cataract, right eye: Secondary | ICD-10-CM | POA: Diagnosis not present

## 2016-08-13 DIAGNOSIS — M545 Low back pain: Secondary | ICD-10-CM | POA: Diagnosis not present

## 2016-08-13 DIAGNOSIS — H04123 Dry eye syndrome of bilateral lacrimal glands: Secondary | ICD-10-CM | POA: Diagnosis not present

## 2016-08-13 DIAGNOSIS — H532 Diplopia: Secondary | ICD-10-CM | POA: Diagnosis not present

## 2016-08-13 DIAGNOSIS — I10 Essential (primary) hypertension: Secondary | ICD-10-CM | POA: Diagnosis not present

## 2016-08-14 DIAGNOSIS — I10 Essential (primary) hypertension: Secondary | ICD-10-CM | POA: Diagnosis not present

## 2016-08-14 DIAGNOSIS — E119 Type 2 diabetes mellitus without complications: Secondary | ICD-10-CM | POA: Diagnosis not present

## 2016-08-14 DIAGNOSIS — R2681 Unsteadiness on feet: Secondary | ICD-10-CM | POA: Diagnosis not present

## 2016-08-14 DIAGNOSIS — R42 Dizziness and giddiness: Secondary | ICD-10-CM | POA: Diagnosis not present

## 2016-08-14 DIAGNOSIS — I69298 Other sequelae of other nontraumatic intracranial hemorrhage: Secondary | ICD-10-CM | POA: Diagnosis not present

## 2016-08-14 DIAGNOSIS — M1991 Primary osteoarthritis, unspecified site: Secondary | ICD-10-CM | POA: Diagnosis not present

## 2016-08-14 DIAGNOSIS — M545 Low back pain: Secondary | ICD-10-CM | POA: Diagnosis not present

## 2016-08-18 DIAGNOSIS — I10 Essential (primary) hypertension: Secondary | ICD-10-CM | POA: Diagnosis not present

## 2016-08-18 DIAGNOSIS — I69298 Other sequelae of other nontraumatic intracranial hemorrhage: Secondary | ICD-10-CM | POA: Diagnosis not present

## 2016-08-18 DIAGNOSIS — R2681 Unsteadiness on feet: Secondary | ICD-10-CM | POA: Diagnosis not present

## 2016-08-18 DIAGNOSIS — M545 Low back pain: Secondary | ICD-10-CM | POA: Diagnosis not present

## 2016-08-18 DIAGNOSIS — R42 Dizziness and giddiness: Secondary | ICD-10-CM | POA: Diagnosis not present

## 2016-08-18 DIAGNOSIS — M1991 Primary osteoarthritis, unspecified site: Secondary | ICD-10-CM | POA: Diagnosis not present

## 2016-08-18 DIAGNOSIS — E119 Type 2 diabetes mellitus without complications: Secondary | ICD-10-CM | POA: Diagnosis not present

## 2016-08-22 DIAGNOSIS — R2681 Unsteadiness on feet: Secondary | ICD-10-CM | POA: Diagnosis not present

## 2016-08-22 DIAGNOSIS — R42 Dizziness and giddiness: Secondary | ICD-10-CM | POA: Diagnosis not present

## 2016-08-22 DIAGNOSIS — M545 Low back pain: Secondary | ICD-10-CM | POA: Diagnosis not present

## 2016-08-22 DIAGNOSIS — I10 Essential (primary) hypertension: Secondary | ICD-10-CM | POA: Diagnosis not present

## 2016-08-22 DIAGNOSIS — M1991 Primary osteoarthritis, unspecified site: Secondary | ICD-10-CM | POA: Diagnosis not present

## 2016-08-22 DIAGNOSIS — I69298 Other sequelae of other nontraumatic intracranial hemorrhage: Secondary | ICD-10-CM | POA: Diagnosis not present

## 2016-08-22 DIAGNOSIS — E119 Type 2 diabetes mellitus without complications: Secondary | ICD-10-CM | POA: Diagnosis not present

## 2016-08-27 DIAGNOSIS — M1991 Primary osteoarthritis, unspecified site: Secondary | ICD-10-CM | POA: Diagnosis not present

## 2016-08-27 DIAGNOSIS — I10 Essential (primary) hypertension: Secondary | ICD-10-CM | POA: Diagnosis not present

## 2016-08-27 DIAGNOSIS — E119 Type 2 diabetes mellitus without complications: Secondary | ICD-10-CM | POA: Diagnosis not present

## 2016-08-27 DIAGNOSIS — I69298 Other sequelae of other nontraumatic intracranial hemorrhage: Secondary | ICD-10-CM | POA: Diagnosis not present

## 2016-08-27 DIAGNOSIS — R42 Dizziness and giddiness: Secondary | ICD-10-CM | POA: Diagnosis not present

## 2016-08-27 DIAGNOSIS — R2681 Unsteadiness on feet: Secondary | ICD-10-CM | POA: Diagnosis not present

## 2016-08-27 DIAGNOSIS — M545 Low back pain: Secondary | ICD-10-CM | POA: Diagnosis not present

## 2016-08-29 DIAGNOSIS — I69298 Other sequelae of other nontraumatic intracranial hemorrhage: Secondary | ICD-10-CM | POA: Diagnosis not present

## 2016-08-29 DIAGNOSIS — E119 Type 2 diabetes mellitus without complications: Secondary | ICD-10-CM | POA: Diagnosis not present

## 2016-08-29 DIAGNOSIS — I10 Essential (primary) hypertension: Secondary | ICD-10-CM | POA: Diagnosis not present

## 2016-08-29 DIAGNOSIS — M545 Low back pain: Secondary | ICD-10-CM | POA: Diagnosis not present

## 2016-08-29 DIAGNOSIS — R42 Dizziness and giddiness: Secondary | ICD-10-CM | POA: Diagnosis not present

## 2016-08-29 DIAGNOSIS — R2681 Unsteadiness on feet: Secondary | ICD-10-CM | POA: Diagnosis not present

## 2016-08-29 DIAGNOSIS — M1991 Primary osteoarthritis, unspecified site: Secondary | ICD-10-CM | POA: Diagnosis not present

## 2016-09-01 DIAGNOSIS — M1991 Primary osteoarthritis, unspecified site: Secondary | ICD-10-CM | POA: Diagnosis not present

## 2016-09-01 DIAGNOSIS — R42 Dizziness and giddiness: Secondary | ICD-10-CM | POA: Diagnosis not present

## 2016-09-01 DIAGNOSIS — I10 Essential (primary) hypertension: Secondary | ICD-10-CM | POA: Diagnosis not present

## 2016-09-01 DIAGNOSIS — E119 Type 2 diabetes mellitus without complications: Secondary | ICD-10-CM | POA: Diagnosis not present

## 2016-09-01 DIAGNOSIS — I69298 Other sequelae of other nontraumatic intracranial hemorrhage: Secondary | ICD-10-CM | POA: Diagnosis not present

## 2016-09-01 DIAGNOSIS — M545 Low back pain: Secondary | ICD-10-CM | POA: Diagnosis not present

## 2016-09-01 DIAGNOSIS — R2681 Unsteadiness on feet: Secondary | ICD-10-CM | POA: Diagnosis not present

## 2016-09-02 ENCOUNTER — Encounter: Payer: Self-pay | Admitting: Neurology

## 2016-09-02 ENCOUNTER — Ambulatory Visit (INDEPENDENT_AMBULATORY_CARE_PROVIDER_SITE_OTHER): Payer: Medicare Other | Admitting: Neurology

## 2016-09-02 VITALS — BP 133/72 | HR 87 | Wt 137.0 lb

## 2016-09-02 DIAGNOSIS — I610 Nontraumatic intracerebral hemorrhage in hemisphere, subcortical: Secondary | ICD-10-CM | POA: Diagnosis not present

## 2016-09-02 NOTE — Patient Instructions (Signed)
I had a long discussion with the patient regarding her recent right thalamic intracerebral hemorrhage, personally reviewed imaging studies and hospital notes and answered questions. She is doing remarkably well with no residual deficits. I recommend she continue strict blood pressure control with goal below 130/90. Check CT angiogram of the brain to rule out any underlying vascular lesions. Start aspirin 81 mg daily for stroke prevention. Patient may resume her prior activities like going to the gym and exercising. She may even drive after she has been given the clearance by her eye doctor. She was advised to return for follow-up in 6 months with my nurse practitioner or call earlier if necessary.

## 2016-09-02 NOTE — Progress Notes (Signed)
[facility] [address] [place]. [place] [address] [phone]       OFFICE FOLLOW-UP NOTE  [name] Date of Birth:  08-09-1934 Medical Record Number:  [id number]   HPI: 82 year pleasant African-American lady seen today for first office follow-up visit after hospital admission for intracerebral hemorrhage in November 2017. History is obtained from the patient and review of hospital records.Anna Hoffman is a 82 y.o. female Who was in her normal state of health until around 4:30 PM. She states that she had sudden onset of dizziness this, unsteadiness, headache that started at this time. She continued to have these problems and presented to the [facility] emergency room where CT scan was performed which demonstrated a 1.5 x 1.2 cm a thalamic hemorrhage without intraventricular extension or hydrocephalus. She was hypertensive with Bp 185/105 and therefore started on IV Cleviprex and then transferred to [facility] neuro ICU for further management.  She denies any recent trauma, denies taking any blood thinners. LKW: 4:30 PM tpa given?: no, ICH ICH Score: 0    she was admitted to the intensive care unit where blood pressure was tightly controlled and she was closely monitored. Follow-up CT scan showed stable appearance of the hemorrhage without increase in size or hydrocephalus. Transthoracic echo showed normal ejection fraction. LDL cholesterol 124 mg percent. Hemoglobin A1c was 6.0. Patient was seen by physical occupational speech therapy and felt to be candidate for inpatient rehabilitation due to her unsteady gait and dizziness. She is transferred to inpatient rehabilitation where she did well and was discharged after 10 days. She is currently living at home. She states she recovered fully and has no residual neurological deficits. She states her blood pressure is well controlled and today it is 135/72. Patient has some trouble walking mostly due to right knee pain. She  recently had underwent injection into the right knee for pain relief. She is refusing to have knee replacement none. She has seen an eye doctor who has asked her to come back in a few weeks to discuss glasses to improve her vision. She is fully independent and lives at home with her son. 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 She is presently getting home physical and occupation therapy but this is about to end.  ROS:   14 system review of systems is positive for  fatigue, blurred vision, eye pain, cough, headache, back pain, knee pain and walking difficulty and all other systems negative PMH:  Past Medical History:  Diagnosis Date  . Diabetes mellitus without complication ([place])   . Hypertension   . Hypokalemia   . Stroke [facility])     Social History:  Social History   Social History  . Marital status: Widowed    Spouse name: N/A  . Number of children: N/A  . Years of education: N/A   Occupational History  . Not on file.   Social History Main Topics  . Smoking status: Never Smoker  . Smokeless tobacco: Never Used  . Alcohol use No  . Drug use: No  . Sexual activity: No   Other Topics Concern  . Not on file   Social History Narrative  . No narrative on file    Medications:   Current Outpatient Prescriptions on File Prior to Visit  Medication Sig Dispense Refill  . acetaminophen (TYLENOL) 325 MG tablet Take 2 tablets (650 mg  total) by mouth every 6 (six) hours as needed for mild pain (or Fever >/= 101).    Marland Kitchen amLODipine (NORVASC) 5 MG tablet Take 1 tablet (5 mg total) by mouth daily. 30 tablet 1  . Ascorbic Acid (VITAMIN C) 1000 MG tablet Take 1,000 mg by mouth daily.    . diclofenac sodium (VOLTAREN) 1 % GEL Apply 2 g topically 3 (three) times daily. 1 Tube 1  . fexofenadine (ALLEGRA) 180 MG tablet Take 180 mg by mouth daily.    . irbesartan (AVAPRO) 150 MG tablet Take 1 tablet (150 mg total) by mouth daily. 30  tablet 1  . Multiple Vitamins-Minerals (STRESS TAB NF PO) Take 1 tablet by mouth daily.    Marland Kitchen omeprazole (PRILOSEC) 40 MG capsule Take 1 capsule (40 mg total) by mouth daily. 30 capsule 1  . rosuvastatin (CRESTOR) 10 MG tablet Take 1 tablet (10 mg total) by mouth daily. 30 tablet 1  . vitamin B-12 (CYANOCOBALAMIN) 1000 MCG tablet Take 1,000 mcg by mouth daily.     No current facility-administered medications on file prior to visit.     Allergies:   Allergies  Allergen Reactions  . Amoxicillin Diarrhea    Has patient had a PCN reaction causing immediate rash, facial/tongue/throat swelling, SOB or lightheadedness with hypotension: no Has patient had a PCN reaction causing severe rash involving mucus membranes or skin necrosis: no Has patient had a PCN reaction that required hospitalization: no pharmacist consult Has patient had a PCN reaction occurring within the last 10 years: yes If all of the above answers are "NO", then may proceed with Cephalosporin use.   . Ampicillin Rash    Has patient had a PCN reaction causing immediate rash, facial/tongue/throat swelling, SOB or lightheadedness with hypotension: No Has patient had a PCN reaction causing severe rash involving mucus membranes or skin necrosis: No Has patient had a PCN reaction that required hospitalization No Has patient had a PCN reaction occurring within the last 10 years: No If all of the above answers are "NO", then may proceed with Cephalosporin use.   . Sulfa Antibiotics Rash    Physical Exam General: well developed, well nourished elderly African-American lady, seated, in no evident distress Head: head normocephalic and atraumatic.  Neck: supple with no carotid or supraclavicular bruits Cardiovascular: regular rate and rhythm, no murmurs Musculoskeletal: no deformity Skin:  no rash/petichiae Vascular:  Normal pulses all extremities Vitals:   09/02/16 0906  BP: 133/72  Pulse: 87   Neurologic Exam Mental Status:  Awake and fully alert. Oriented to place and time. Recent and remote memory intact. Attention span, concentration and fund of knowledge appropriate. Mood and affect appropriate.  Cranial Nerves: Fundoscopic exam reveals sharp disc margins. Pupils equal, briskly reactive to light. Extraocular movements full without nystagmus. Visual fields full to confrontation. Hearing intact. Facial sensation intact. Face, tongue, palate moves normally and symmetrically.  Motor: Normal bulk and tone. Normal strength in all tested extremity muscles. Sensory.: intact to touch ,pinprick .position and vibratory sensation.  Coordination: Rapid alternating movements normal in all extremities. Finger-to-nose and heel-to-shin performed accurately bilaterally. Gait and Station: Arises from chair without difficulty. Stance is normal. Gait demonstrates normal stride length and balance but favors right knee while walking. . Able to heel, toe and tandem walk with  difficulty.  Reflexes: 1+ and symmetric. Toes downgoing.   NIHSS  0 Modified Rankin  2   ASSESSMENT: 26 year African-American lady with small right thalamic intracerebral hemorrhage in November 2017  of hypertensive etiology. She is doing remarkably well.    PLAN: I had a long discussion with the patient regarding her recent right thalamic intracerebral hemorrhage, personally reviewed imaging studies and hospital notes and answered questions. She is doing remarkably well with no residual deficits. I recommend she continue strict blood pressure control with goal below 130/90. Check CT angiogram of the brain to rule out any underlying vascular lesions. Start aspirin 81 mg daily for stroke prevention. Patient may resume her prior activities like going to the gym and exercising. She may even drive after she has been given the clearance by her eye doctor. She was advised to return for follow-up in 6 months with my nurse practitioner or call earlier if necessary.Greater  than 50% of time during this 25 minute visit was spent on counseling,explanation of diagnosis, planning of further management, discussion with patient and family and coordination of care Antony Contras, MD  [facility] [address] [place], [place] [address]  Phone [phone] Fax [phone] Note: This document was prepared with digital dictation and possible smart phrase technology. Any transcriptional errors that result from this process are unintentional

## 2016-09-03 DIAGNOSIS — E119 Type 2 diabetes mellitus without complications: Secondary | ICD-10-CM | POA: Diagnosis not present

## 2016-09-03 DIAGNOSIS — I69298 Other sequelae of other nontraumatic intracranial hemorrhage: Secondary | ICD-10-CM | POA: Diagnosis not present

## 2016-09-03 DIAGNOSIS — R42 Dizziness and giddiness: Secondary | ICD-10-CM | POA: Diagnosis not present

## 2016-09-03 DIAGNOSIS — I10 Essential (primary) hypertension: Secondary | ICD-10-CM | POA: Diagnosis not present

## 2016-09-03 DIAGNOSIS — R2681 Unsteadiness on feet: Secondary | ICD-10-CM | POA: Diagnosis not present

## 2016-09-03 DIAGNOSIS — M545 Low back pain: Secondary | ICD-10-CM | POA: Diagnosis not present

## 2016-09-03 DIAGNOSIS — M1991 Primary osteoarthritis, unspecified site: Secondary | ICD-10-CM | POA: Diagnosis not present

## 2016-09-04 DIAGNOSIS — I129 Hypertensive chronic kidney disease with stage 1 through stage 4 chronic kidney disease, or unspecified chronic kidney disease: Secondary | ICD-10-CM | POA: Diagnosis not present

## 2016-09-04 DIAGNOSIS — N08 Glomerular disorders in diseases classified elsewhere: Secondary | ICD-10-CM | POA: Diagnosis not present

## 2016-09-04 DIAGNOSIS — N182 Chronic kidney disease, stage 2 (mild): Secondary | ICD-10-CM | POA: Diagnosis not present

## 2016-09-04 DIAGNOSIS — E1122 Type 2 diabetes mellitus with diabetic chronic kidney disease: Secondary | ICD-10-CM | POA: Diagnosis not present

## 2016-09-04 DIAGNOSIS — D649 Anemia, unspecified: Secondary | ICD-10-CM | POA: Diagnosis not present

## 2016-09-05 ENCOUNTER — Telehealth: Payer: Self-pay | Admitting: Neurology

## 2016-09-05 ENCOUNTER — Ambulatory Visit
Admission: RE | Admit: 2016-09-05 | Discharge: 2016-09-05 | Disposition: A | Discharge status: Home / Self Care | Payer: Medicare Other | Source: Ambulatory Visit | Attending: Neurology | Admitting: Neurology

## 2016-09-05 DIAGNOSIS — I610 Nontraumatic intracerebral hemorrhage in hemisphere, subcortical: Secondary | ICD-10-CM | POA: Diagnosis not present

## 2016-09-05 MED ORDER — IOPAMIDOL (ISOVUE-370) INJECTION 76%
80.0000 mL | Freq: Once | INTRAVENOUS | Status: AC | PRN
  Administered 2016-09-05: 80 mL via INTRAVENOUS

## 2016-09-05 NOTE — Telephone Encounter (Signed)
LEft vm on patients vm to call back about Ct angio results before 1200pm.. IF patient does not call back before 1200pm will notify her on Monday.

## 2016-09-05 NOTE — Telephone Encounter (Signed)
PT call back and her phone call was transferred to Dr.Sethi to discuss the results of the ct angio head.

## 2016-09-05 NOTE — Telephone Encounter (Signed)
Patient received call from Dr. Leonie Man to discuss CT results. Dr. Leonie Man left a  VM but she could not understand all of the message.

## 2016-09-08 ENCOUNTER — Telehealth: Payer: Self-pay | Admitting: Neurology

## 2016-09-08 ENCOUNTER — Other Ambulatory Visit: Payer: Self-pay | Admitting: Physical Medicine & Rehabilitation

## 2016-09-08 MED FILL — IRBESARTAN 150 MG TABLET: 150 | 90 days supply | Qty: 90 | Fill #0

## 2016-09-08 NOTE — Telephone Encounter (Signed)
I attempted to reach the patient by the following: 1) called her number - no answer or machine 2) called her son, Thayer Dallas (on HIPAA) - no answer - voicemail not set up 3) called her son, [name] (on HIPAA) - left message 4) called her granddaughter, Riki Altes (on HIPAA) - left message

## 2016-09-08 NOTE — Telephone Encounter (Signed)
Spoke to patient - she had CT w/ contrast, for the first time, on 09/05/16.  States she developed a headache later that day, significant enough that she had to lay down to sleep through the pain.  The following morning, she starting experiencing nausea, vomiting and diarrhea.  She denies flushing, rash, swelling or difficulty breathing.  She has not had a fever, cough or other symptoms of infections.  She has stayed well hydrated by increasing her water intake.  She is concerned this was an allergic reaction to the contrast dye.  She is starting to feel slightly better today.

## 2016-09-08 NOTE — Telephone Encounter (Signed)
Patient called states she has been having vomiting and diarrhea since after CT was preformed.  Patient is wanting to make sure she isn't having an allergic reaction to contrast.  Please call

## 2016-09-08 NOTE — Telephone Encounter (Signed)
I called the patient's listed telephone number which rang but got no reply and was unable to leave message on answering machine

## 2016-09-09 NOTE — Telephone Encounter (Signed)
Pt called back, said she is still having trouble with her stomach today. Pt said she was asleep yesterday when RN tried to call. she said she would not take a nap today and will keep the phone with her. RN please call

## 2016-09-09 NOTE — Telephone Encounter (Signed)
Message sent to Dr. Sethi. 

## 2016-09-09 NOTE — Telephone Encounter (Signed)
I spoke to the patient who informed me that she was having diarrhea for last 2 days began after her CT angiogram. He denied any rash or angioedema. I doubt this is a reaction to the dye I strongly encouraged her to see her primary care physician for evaluation for diarrhea and lab work. I again communicated to her the results of CT angiogram showing satisfactory resolution of the brain hemorrhage but showing multivessel stenosis which places her at risk for stroke and recommend she continue aspirin and aggressive control of hypertension, sugars and cholesterol. She voiced understanding.

## 2016-09-12 ENCOUNTER — Encounter: Payer: Self-pay | Admitting: Physical Medicine & Rehabilitation

## 2016-09-12 ENCOUNTER — Ambulatory Visit (HOSPITAL_BASED_OUTPATIENT_CLINIC_OR_DEPARTMENT_OTHER): Payer: Medicare Other | Admitting: Physical Medicine & Rehabilitation

## 2016-09-12 ENCOUNTER — Encounter: Payer: Medicare Other | Attending: Physical Medicine & Rehabilitation

## 2016-09-12 VITALS — BP 147/79 | Temp 97.7°F | Resp 14

## 2016-09-12 DIAGNOSIS — I610 Nontraumatic intracerebral hemorrhage in hemisphere, subcortical: Secondary | ICD-10-CM

## 2016-09-12 DIAGNOSIS — R269 Unspecified abnormalities of gait and mobility: Secondary | ICD-10-CM | POA: Diagnosis not present

## 2016-09-12 DIAGNOSIS — M25561 Pain in right knee: Secondary | ICD-10-CM | POA: Diagnosis not present

## 2016-09-12 DIAGNOSIS — Z5189 Encounter for other specified aftercare: Secondary | ICD-10-CM | POA: Diagnosis not present

## 2016-09-12 DIAGNOSIS — E119 Type 2 diabetes mellitus without complications: Secondary | ICD-10-CM | POA: Insufficient documentation

## 2016-09-12 DIAGNOSIS — I10 Essential (primary) hypertension: Secondary | ICD-10-CM | POA: Insufficient documentation

## 2016-09-12 DIAGNOSIS — I619 Nontraumatic intracerebral hemorrhage, unspecified: Secondary | ICD-10-CM | POA: Diagnosis not present

## 2016-09-12 NOTE — Patient Instructions (Signed)
No driving until cleared by eye doctor

## 2016-09-12 NOTE — Progress Notes (Signed)
Subjective:    Patient ID: Anna Hoffman, female    DOB: 1935/01/07, 82 y.o.   MRN: [id number]  HPI  82 year old female with history of right thalamic hemorrhage. She was hospitalized at [facility] rehabilitation DATE OF ADMISSION: 07/01/2016 DATE OF DISCHARGE: 07/12/2016  As followed up with Dr. Posey Pronto on 07/16/2016. She was doing well at that time Has subsequently followed up with neurology, Dr. Leonie Man Right knee pain seen by Ortho and improved from  Pt had CT angiogram, post procedure nausea and vomiting Also with diarrhea ICH resolved, multivessel stenosis Amb with cane outside the house Independent with dressing and bathing One fall when foot got caught under fridge door, No injury Pain Inventory Average Pain 4 Pain Right Now no pain My pain is dull  In the last 24 hours, has pain interfered with the following? General activity 0 Relation with others 0 Enjoyment of life 0 What TIME of day is your pain at its worst? All Sleep (in general) Good  Pain is worse with: walking, bending and sitting Pain improves with: n/a Relief from Meds: n/a  Mobility how many minutes can you walk? 2  Function retired Do you have any goals in this area?  yes  Neuro/Psych No problems in this area  Prior Studies Any changes since last visit?  no  Physicians involved in your care Any changes since last visit?  no   Family History  Problem Relation Age of Onset  . Prostate cancer Father    Social History   Social History  . Marital status: Widowed    Spouse name: N/A  . Number of children: N/A  . Years of education: N/A   Social History Main Topics  . Smoking status: Never Smoker  . Smokeless tobacco: Never Used  . Alcohol use No  . Drug use: No  . Sexual activity: No   Other Topics Concern  . None   Social History Narrative  . None   Past Surgical History:  Procedure Laterality Date  . ABDOMINAL HYSTERECTOMY    . ESOPHAGOGASTRODUODENOSCOPY N/A 09/01/2014   Procedure: ESOPHAGOGASTRODUODENOSCOPY (EGD);  Surgeon: Lear Ng, MD;  Location: [facility];  Service: Endoscopy;  Laterality: N/A;  . HIATAL HERNIA REPAIR N/A 09/04/2014   Procedure: LAPAROSCOPIC REPAIR OF HIATAL HERNIA;  Surgeon: Excell Seltzer, MD;  Location: [facility];  Service: General;  Laterality: N/A;  With MESH  . IR GENERIC HISTORICAL  04/10/2016   IR US GUIDE VASC ACCESS RIGHT 04/10/2016 Corrie Mckusick, DO [facility]  . IR GENERIC HISTORICAL  04/10/2016   IR ANGIOGRAM SELECTIVE EACH ADDITIONAL VESSEL 04/10/2016 Corrie Mckusick, DO [facility]  . IR GENERIC HISTORICAL  04/10/2016   IR ANGIOGRAM SELECTIVE EACH ADDITIONAL VESSEL 04/10/2016 Corrie Mckusick, DO [facility]  . IR GENERIC HISTORICAL  04/10/2016   IR EMBO TUMOR ORGAN ISCHEMIA INFARCT INC GUIDE ROADMAPPING 04/10/2016 Corrie Mckusick, DO [facility]  . IR GENERIC HISTORICAL  04/10/2016   IR ANGIOGRAM SELECTIVE EACH ADDITIONAL VESSEL 04/10/2016 Corrie Mckusick, DO [facility]  . IR GENERIC HISTORICAL  04/10/2016   IR ANGIOGRAM SELECTIVE EACH ADDITIONAL VESSEL 04/10/2016 Corrie Mckusick, DO [facility]  . IR GENERIC HISTORICAL  04/10/2016   IR RENAL SELECTIVE  UNI INC S&I MOD SED 04/10/2016 Corrie Mckusick, DO [facility]  . IR GENERIC HISTORICAL  03/06/2016   IR RADIOLOGIST EVAL & MGMT 03/06/2016 Corrie Mckusick, DO [facility]  . IR GENERIC HISTORICAL  03/26/2016   IR RADIOLOGIST EVAL & MGMT 03/26/2016  Corrie Mckusick, DO [facility]  . IR GENERIC HISTORICAL  04/29/2016   IR RADIOLOGIST EVAL & MGMT 04/29/2016 [facility]  . KNEE SURGERY     Past Medical History:  Diagnosis Date  . Diabetes mellitus without complication ([place])   . Hypertension   . Hypokalemia   . Stroke ([place])    BP (!) 147/79   Temp 97.7 F (36.5 C)   Resp 14   SpO2 95%   Opioid Risk Score:   Fall Risk Score:  `1  Depression screen PHQ 2/9  Depression screen [facility] 2/9 07/16/2016 07/14/2016  Decreased Interest 0 0  Down, Depressed, Hopeless 0 0  PHQ - 2 Score  0 0  Altered sleeping 0 -  Tired, decreased energy 0 -  Change in appetite 0 -  Feeling bad or failure about yourself  0 -  Trouble concentrating 3 -  Moving slowly or fidgety/restless 0 -  Suicidal thoughts 0 -  PHQ-9 Score 3 -     Review of Systems  Constitutional: Negative.   HENT: Negative.   Eyes: Negative.   Respiratory: Negative.   Cardiovascular: Negative.   Gastrointestinal: Positive for diarrhea, nausea and vomiting.  Endocrine: Negative.   Genitourinary: Negative.   Musculoskeletal: Negative.   Skin: Negative.   Allergic/Immunologic: Negative.   Neurological: Negative.   Hematological: Negative.   Psychiatric/Behavioral: Negative.   All other systems reviewed and are negative.      Objective:   Physical Exam  Constitutional: She is oriented to person, place, and time. She appears well-developed and well-nourished.  HENT:  Head: Normocephalic and atraumatic.  Eyes: Conjunctivae and EOM are normal. Pupils are equal, round, and reactive to light.  Neurological: She is alert and oriented to person, place, and time.  Psychiatric: She has a normal mood and affect. Her behavior is normal. Judgment and thought content normal.  Nursing note and vitals reviewed.   Her strength is 5/5 bilateral deltoid, biceps, triceps, grip, hip flexor, knee extensor, ankle dorsiflexor, plantar flexor. Gait is without evidence of toe drag or knee instability.       Assessment & Plan:   1. Right thalamic hemorrhage excellent recovery. She will follow-up with neurology for CT angiogram results. Do not think she needs any outpatient therapy. Physical medicine and rehabilitation follow-up on an as-needed basis. Primary care follow-up for stroke risk factors

## 2016-09-15 DIAGNOSIS — M1711 Unilateral primary osteoarthritis, right knee: Secondary | ICD-10-CM | POA: Diagnosis not present

## 2016-09-15 MED FILL — traMADol HCL 50 MG TABS: 50 | 7 days supply | Qty: 30 | Fill #0

## 2016-09-24 DIAGNOSIS — H26491 Other secondary cataract, right eye: Secondary | ICD-10-CM | POA: Diagnosis not present

## 2016-09-24 DIAGNOSIS — H04123 Dry eye syndrome of bilateral lacrimal glands: Secondary | ICD-10-CM | POA: Diagnosis not present

## 2016-09-24 DIAGNOSIS — E119 Type 2 diabetes mellitus without complications: Secondary | ICD-10-CM | POA: Diagnosis not present

## 2016-09-24 DIAGNOSIS — H532 Diplopia: Secondary | ICD-10-CM | POA: Diagnosis not present

## 2016-09-24 DIAGNOSIS — H10413 Chronic giant papillary conjunctivitis, bilateral: Secondary | ICD-10-CM | POA: Diagnosis not present

## 2016-10-21 DIAGNOSIS — J309 Allergic rhinitis, unspecified: Secondary | ICD-10-CM | POA: Diagnosis not present

## 2016-10-21 DIAGNOSIS — N182 Chronic kidney disease, stage 2 (mild): Secondary | ICD-10-CM | POA: Diagnosis not present

## 2016-10-21 DIAGNOSIS — E1122 Type 2 diabetes mellitus with diabetic chronic kidney disease: Secondary | ICD-10-CM | POA: Diagnosis not present

## 2016-10-21 DIAGNOSIS — I129 Hypertensive chronic kidney disease with stage 1 through stage 4 chronic kidney disease, or unspecified chronic kidney disease: Secondary | ICD-10-CM | POA: Diagnosis not present

## 2016-10-23 ENCOUNTER — Other Ambulatory Visit: Payer: Self-pay | Admitting: Interventional Radiology

## 2016-10-23 DIAGNOSIS — D1771 Benign lipomatous neoplasm of kidney: Secondary | ICD-10-CM

## 2016-11-05 MED FILL — ROSUVASTATIN CALCIUM 20 MG: 20 | 90 days supply | Qty: 90 | Fill #1

## 2016-11-05 MED FILL — AMLODIPINE BESYLATE 10 MG T: 10 | 90 days supply | Qty: 90 | Fill #1

## 2016-11-05 MED FILL — OMEPRAZOLE DR 20 MG CAPSULE: 20 | 90 days supply | Qty: 90 | Fill #1

## 2016-11-11 ENCOUNTER — Other Ambulatory Visit: Payer: Self-pay | Admitting: *Deleted

## 2016-11-11 DIAGNOSIS — D1771 Benign lipomatous neoplasm of kidney: Secondary | ICD-10-CM

## 2016-11-12 ENCOUNTER — Encounter (HOSPITAL_COMMUNITY): Payer: Self-pay

## 2016-11-12 ENCOUNTER — Ambulatory Visit (HOSPITAL_COMMUNITY)
Admission: RE | Admit: 2016-11-12 | Discharge: 2016-11-12 | Disposition: A | Discharge status: Home / Self Care | Payer: Medicare Other | Source: Ambulatory Visit | Attending: Interventional Radiology | Admitting: Interventional Radiology

## 2016-11-12 ENCOUNTER — Ambulatory Visit
Admission: RE | Admit: 2016-11-12 | Discharge: 2016-11-12 | Disposition: A | Discharge status: Home / Self Care | Payer: Medicare Other | Source: Ambulatory Visit | Attending: Interventional Radiology | Admitting: Interventional Radiology

## 2016-11-12 DIAGNOSIS — I700 Atherosclerosis of aorta: Secondary | ICD-10-CM | POA: Insufficient documentation

## 2016-11-12 DIAGNOSIS — K55039 Acute (reversible) ischemia of large intestine, extent unspecified: Secondary | ICD-10-CM | POA: Insufficient documentation

## 2016-11-12 DIAGNOSIS — K449 Diaphragmatic hernia without obstruction or gangrene: Secondary | ICD-10-CM | POA: Diagnosis not present

## 2016-11-12 DIAGNOSIS — D1771 Benign lipomatous neoplasm of kidney: Secondary | ICD-10-CM

## 2016-11-12 DIAGNOSIS — I701 Atherosclerosis of renal artery: Secondary | ICD-10-CM | POA: Insufficient documentation

## 2016-11-12 HISTORY — PX: IR RADIOLOGIST EVAL & MGMT: IMG5224

## 2016-11-12 LAB — POCT I-STAT CREATININE: Creatinine, Ser: 1.2 mg/dL — ABNORMAL HIGH (ref 0.44–1.00)

## 2016-11-12 MED ORDER — IOPAMIDOL (ISOVUE-300) INJECTION 61%
100.0000 mL | Freq: Once | INTRAVENOUS | Status: AC | PRN
Start: 2016-11-12 — End: 2016-11-12
  Administered 2016-11-12: 100 mL via INTRAVENOUS

## 2016-11-12 MED ORDER — IOPAMIDOL (ISOVUE-300) INJECTION 61%
INTRAVENOUS | Status: AC
  Filled 2016-11-12: qty 100

## 2016-11-12 NOTE — Progress Notes (Signed)
Chief Complaint: Patient was seen in consultation today for  Chief Complaint  Patient presents with  . Follow-up    follow up Embolization of Left Renal AML     at the request of Wagner,Jaime  Referring Physician(s): Wagner,Jaime  History of Present Illness: Anna Hoffman is a 82 y.o. 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 She recovered well from the procedure. Unfortunately she had a hemorrhagic stroke last fall as well, but has actually recovered very from that also. She was having difficulty affording her BP medicine and was not able to take it, therefore her BP got out of control which led to her stroke. She has now been able to get her BP meds and it has been much more stable. She denies any abdominal or back pain. She denies any hematuria. We had her get a CTA this am prior to her visit today.  Past Medical History:  Diagnosis Date  . Diabetes mellitus without complication ([place])   . Hypertension   . Hypokalemia   . Stroke [facility])     Past Surgical History:  Procedure Laterality Date  . ABDOMINAL HYSTERECTOMY    . ESOPHAGOGASTRODUODENOSCOPY N/A 09/01/2014   Procedure: ESOPHAGOGASTRODUODENOSCOPY (EGD);  Surgeon: Lear Ng, MD;  Location: [facility];  Service: Endoscopy;  Laterality: N/A;  . HIATAL HERNIA REPAIR N/A 09/04/2014   Procedure: LAPAROSCOPIC REPAIR OF HIATAL HERNIA;  Surgeon: Excell Seltzer, MD;  Location[facility];  Service: General;  Laterality: N/A;  With MESH  . IR GENERIC HISTORICAL  04/10/2016   IR US GUIDE VASC ACCESS RIGHT 04/10/2016 Corrie Mckusick, DO [facility]  . IR GENERIC HISTORICAL  04/10/2016   IR ANGIOGRAM SELECTIVE EACH ADDITIONAL VESSEL 04/10/2016 Corrie Mckusick, DO [facility]  . IR GENERIC HISTORICAL  04/10/2016   IR ANGIOGRAM SELECTIVE EACH ADDITIONAL VESSEL 04/10/2016 Corrie Mckusick, DO [facility]  . IR GENERIC HISTORICAL  04/10/2016   IR EMBO TUMOR ORGAN ISCHEMIA  INFARCT INC GUIDE ROADMAPPING 04/10/2016 Corrie Mckusick, DO [facility]  . IR GENERIC HISTORICAL  04/10/2016   IR ANGIOGRAM SELECTIVE EACH ADDITIONAL VESSEL 04/10/2016 Corrie Mckusick, DO [facility]  . IR GENERIC HISTORICAL  04/10/2016   IR ANGIOGRAM SELECTIVE EACH ADDITIONAL VESSEL 04/10/2016 Corrie Mckusick, DO [facility]  . IR GENERIC HISTORICAL  04/10/2016   IR RENAL SELECTIVE  UNI INC S&I MOD SED 04/10/2016 Corrie Mckusick, DO [facility]  . IR GENERIC HISTORICAL  03/06/2016   IR RADIOLOGIST EVAL & MGMT 03/06/2016 Corrie Mckusick, DO [facility]  . IR GENERIC HISTORICAL  03/26/2016   IR RADIOLOGIST EVAL & MGMT 03/26/2016 Corrie Mckusick, DO [facility]  . IR GENERIC HISTORICAL  04/29/2016   IR RADIOLOGIST EVAL & MGMT 04/29/2016 [facility]  . KNEE SURGERY      Allergies: Amoxicillin; Ampicillin; and Sulfa antibiotics  Medications: Prior to Admission medications   Medication Sig Start Date End Date Taking? Authorizing Provider  acetaminophen (TYLENOL) 325 MG tablet Take 2 tablets (650 mg total) by mouth every 6 (six) hours as needed for mild pain (or Fever >/= 101). 02/08/16  Yes Clanford L Johnson, MD  amLODipine (NORVASC) 5 MG tablet Take 1 tablet (5 mg total) by mouth daily. 07/11/16  Yes Daniel J Angiulli, PA-C  Ascorbic Acid (VITAMIN C) 1000 MG tablet Take 1,000 mg by mouth daily.   Yes Historical Provider, MD  diclofenac sodium (VOLTAREN) 1 % GEL Apply 2  g topically 3 (three) times daily. 07/11/16  Yes Daniel J Angiulli, PA-C  fexofenadine (ALLEGRA) 180 MG tablet Take 180 mg by mouth daily.   Yes Historical Provider, MD  irbesartan (AVAPRO) 150 MG tablet Take 1 tablet (150 mg total) by mouth daily. 07/11/16  Yes Daniel J Angiulli, PA-C  Multiple Vitamins-Minerals (STRESS TAB NF PO) Take 1 tablet by mouth daily.   Yes Historical Provider, MD  omeprazole (PRILOSEC) 40 MG capsule Take 1 capsule (40 mg total) by mouth daily. 07/11/16  Yes Daniel J Angiulli, PA-C  rosuvastatin (CRESTOR) 10 MG  tablet Take 1 tablet (10 mg total) by mouth daily. 07/11/16  Yes Daniel J Angiulli, PA-C  vitamin B-12 (CYANOCOBALAMIN) 1000 MCG tablet Take 1,000 mcg by mouth daily.   Yes Historical Provider, MD     Family History  Problem Relation Age of Onset  . Prostate cancer Father     Social History   Social History  . Marital status: Widowed    Spouse name: N/A  . Number of children: N/A  . Years of education: N/A   Social History Main Topics  . Smoking status: Never Smoker  . Smokeless tobacco: Never Used  . Alcohol use No  . Drug use: No  . Sexual activity: No   Other Topics Concern  . Not on file   Social History Narrative  . No narrative on file     Review of Systems: A 12 point ROS discussed and pertinent positives are indicated in the HPI above.  All other systems are negative.  Review of Systems  Vital Signs: BP 124/69 (BP Location: Left Arm, Patient Position: Sitting, Cuff Size: Normal)   Pulse 85   Temp 98.4 F (36.9 C)   Resp 14   Ht 5' 5.5" (1.664 m)   Wt 137 lb (62.1 kg)   SpO2 93%   BMI 22.45 kg/m   Physical Exam  Constitutional: She is oriented to person, place, and time. She appears well-developed and well-nourished. No distress.  HENT:  Head: Normocephalic.  Cardiovascular: Normal rate, regular rhythm and normal heart sounds.   Pulmonary/Chest: Effort normal and breath sounds normal. No respiratory distress.  Abdominal: Soft. She exhibits no distension and no mass. There is no tenderness.  Neurological: She is alert and oriented to person, place, and time.  Skin: Skin is warm and dry.  Psychiatric: She has a normal mood and affect. Judgment normal.     Imaging: Ct Angio Abdomen W &/or Wo Contrast  Result Date: 11/12/2016 CLINICAL DATA:  82 year old female with a history of prior left renal angiomyolipoma hemorrhage, 02/04/2016. She has been treated with selective embolization 04/10/2016. EXAM: CT ANGIOGRAPHY ABDOMEN TECHNIQUE: Multidetector CT  imaging of the abdomen was performed using the standard protocol during bolus administration of intravenous contrast. Multiplanar reconstructed images and MIPs were obtained and reviewed to evaluate the vascular anatomy. CONTRAST:  12mL ISOVUE-300 IOPAMIDOL (ISOVUE-300) INJECTION 61% COMPARISON:  CT 03/17/2016, 02/04/2016, angiogram 04/10/2016 FINDINGS: VASCULAR Aorta: Mixed calcified and soft plaque of the lower thoracic and abdominal aorta. No aneurysm or dissection flap. Celiac: Minimal atherosclerotic changes at the celiac artery origin with patency of celiac artery. The angulation of the celiac artery may reflect some compression by the diaphragmatic crus. Typical branching of celiac artery. SMA: SMA is patent with mild atherosclerotic calcifications at the origin. There does not appear to be greater than 50% stenosis. Renals: Bilateral renal arteries are patent. No accessory renal artery. On the right there is mixed calcified and  soft plaque. Approximate 1 cm on the right renal artery origin there is circumferential soft plaque which appears to narrow the lumen greater than 50%. Distal atherosclerotic changes of the right renal artery, with perhaps 50% stenosis. On the left there are atherosclerotic changes at the origin which do not appear to narrow the vessel greater than 50%. No aneurysm. IMA: IMA patent. Veins: Unremarkable appearance of the venous system. Review of the MIP images confirms the above findings. NON-VASCULAR Lower chest: Unremarkable. Hepatobiliary: Unremarkable appearance of the liver. Unremarkable gall bladder. Pancreas: Unremarkable appearance of the pancreas. No pericholecystic fluid or inflammatory changes. Unremarkable ductal system. Spleen: Unremarkable. Adrenals/Urinary Tract: Unremarkable appearance of adrenal glands. Right: Uniform enhancement of the right kidney, symmetric to the left. Small hypodense lesions of the right kidney are again demonstrated, unchanged from the  comparison. Although these are too small to completely characterize, they appear to represent benign cysts. No hydronephrosis or nephrolithiasis. No perinephric stranding. Unremarkable visualized right ureter. Greatest cranial caudal diameter measures 8.6 cm Left: Treatment changes of prior embolization left angiomyolipoma. Greatest diameter on the current study measures 2.8 cm. Greatest diameter on the prior measured 4.2 cm. No internal venous arterial aneurysm appreciated on the CTA. There has been interval resolution of the perinephric fluid/hemorrhage. With no new hemorrhage. The cortex of the left kidney enhances symmetric to the right. Bosniak 1 cyst at the inferior collecting system. Additional small low-density lesions are too small to characterize though most likely benign cysts. Greatest cranial caudal diameter of the left kidney measures 10.3 cm. Stomach/Bowel: Similar appearance of hiatal hernia. No associated inflammatory changes or adenopathy. Unremarkable visualized small bowel without abnormal distention. Colonic diverticular are present in the visualized colon, without associated inflammatory changes. Lymphatic: No abdominal lymphadenopathy. Mesenteric: No free fluid or air. No adenopathy. Other: No abdominal wall hernia. No infiltration of the abdominal wall soft tissues. Musculoskeletal: No evidence of acute fracture. Degenerative changes of the lower lumbar spine with vacuum disc phenomenon. No bony canal narrowing. Bilateral facet disease is most pronounced in the lower lumbar spine. IMPRESSION: No acute finding. Re- demonstration of left renal angiomyolipoma, which has demonstrated a decrease in diameter from 4.2 cm to current of 2.8 cm, which is approximately 70% reduction in volume. There has been interval resolution of the perinephric hemorrhage. Symmetric perfusion of the bilateral kidneys, although the right kidney measures smaller than the left. Atherosclerotic changes of the bilateral  renal arteries, more pronounced on the right. Given the focal stenosis apparent just after the origin of the right renal artery and the relatively smaller right kidney, sequela of renal vascular hypertension may be considered. Aortic atherosclerosis and mesenteric disease without high-grade stenosis of celiac artery or superior mesenteric artery. Re- demonstration of a hiatal hernia. Signed, Dulcy Fanny. Earleen Newport, DO [facility] Electronically Signed   By: Corrie Mckusick D.O.   On: 11/12/2016 09:53    Labs:  CBC:  Recent Labs  02/08/16 0433 04/10/16 1007 06/27/16 1853 06/27/16 1907 07/02/16 0525  WBC 9.7 6.6 8.4  --  8.1  HGB 10.9* 13.2 12.7 13.9 12.4  HCT 31.2* 40.5 38.7 41.0 37.4  PLT 339 355 400  --  359    COAGS:  Recent Labs  02/05/16 0858 04/10/16 1007 06/27/16 1853  INR 1.17 1.02 0.93  APTT 25  --  30    BMP:  Recent Labs  04/10/16 1007 06/27/16 1853 06/27/16 1907 06/30/16 0706 07/02/16 0525 11/12/16 0817  NA 137 137 139  138 135  --   K 3.8 3.6 3.6 4.1 4.5  --   CL 103 105 103 106 105  --   CO2 25 23  --  25 22  --   GLUCOSE 96 113* 109* 100* 103*  --   BUN 17 23* 24* 21* 22*  --   CALCIUM 9.4 9.2  --  9.0 9.4  --   CREATININE 0.86 1.07* 1.10* 1.00 1.01* 1.20*  GFRNONAA >60 48*  --  52* 52*  --   GFRAA >60 56*  --  >60 60*  --     LIVER FUNCTION TESTS:  Recent Labs  02/04/16 1255 02/05/16 0312 06/27/16 1853 07/02/16 0525  BILITOT 0.8 0.6 0.7 0.4  AST 26 18 25 20   ALT 17 14 22 20   ALKPHOS 60 47 73 64  PROT 6.4* 5.5* 7.2 5.7*  ALBUMIN 3.9 3.3* 4.4 3.3*    TUMOR MARKERS: No results for input(s): AFPTM, CEA, CA199, CHROMGRNA in the last 8760 hours.  Assessment and Plan: Left renal AML with hemorrhage treated with arterial embolization. Her CTA looks good, interval reduction in size of the AML and resolution of the perinephric hemorrhage. Incidental finding of renal artery stenosis which  may be a contributing factor in her BP control. Will communicate with her PCP, and she plans to see Dr. Baird Cancer next month for follow up. We will otherwise schedule [place] in a year for follow up.  Thank you for this interesting consult.  I greatly enjoyed meeting Anna Hoffman and look forward to participating in their care.  A copy of this report was sent to the requesting provider on this date.  Electronically Signed: Ascencion Dike 11/12/2016, 10:27 AM   I spent a total of 30 minutes in face to face in clinical consultation, greater than 50% of which was counseling/coordinating care for follow up of left renal AML embolization

## 2016-12-04 MED FILL — IRBESARTAN 150 MG TABLET: 150 | 90 days supply | Qty: 90 | Fill #1

## 2016-12-22 DIAGNOSIS — I129 Hypertensive chronic kidney disease with stage 1 through stage 4 chronic kidney disease, or unspecified chronic kidney disease: Secondary | ICD-10-CM | POA: Diagnosis not present

## 2016-12-22 DIAGNOSIS — N08 Glomerular disorders in diseases classified elsewhere: Secondary | ICD-10-CM | POA: Diagnosis not present

## 2016-12-22 DIAGNOSIS — E1122 Type 2 diabetes mellitus with diabetic chronic kidney disease: Secondary | ICD-10-CM | POA: Diagnosis not present

## 2016-12-22 DIAGNOSIS — N182 Chronic kidney disease, stage 2 (mild): Secondary | ICD-10-CM | POA: Diagnosis not present

## 2016-12-23 DIAGNOSIS — M1711 Unilateral primary osteoarthritis, right knee: Secondary | ICD-10-CM | POA: Diagnosis not present

## 2016-12-30 DIAGNOSIS — M1711 Unilateral primary osteoarthritis, right knee: Secondary | ICD-10-CM | POA: Diagnosis not present

## 2016-12-30 MED FILL — traMADol HCL 50 MG TABS: 50 | 15 days supply | Qty: 60 | Fill #0

## 2017-01-06 DIAGNOSIS — M1711 Unilateral primary osteoarthritis, right knee: Secondary | ICD-10-CM | POA: Diagnosis not present

## 2017-01-14 DIAGNOSIS — M1711 Unilateral primary osteoarthritis, right knee: Secondary | ICD-10-CM | POA: Diagnosis not present

## 2017-01-14 DIAGNOSIS — M79642 Pain in left hand: Secondary | ICD-10-CM | POA: Diagnosis not present

## 2017-01-14 MED FILL — VIT D3-50 50,000 UNITS CAPS: 1.25 MG | 42 days supply | Qty: 6 | Fill #0

## 2017-01-16 ENCOUNTER — Encounter: Payer: Self-pay | Admitting: Interventional Radiology

## 2017-01-23 DIAGNOSIS — M1711 Unilateral primary osteoarthritis, right knee: Secondary | ICD-10-CM | POA: Diagnosis not present

## 2017-01-29 DIAGNOSIS — M79642 Pain in left hand: Secondary | ICD-10-CM | POA: Diagnosis not present

## 2017-02-05 DIAGNOSIS — M1711 Unilateral primary osteoarthritis, right knee: Secondary | ICD-10-CM | POA: Diagnosis not present

## 2017-02-05 DIAGNOSIS — M79642 Pain in left hand: Secondary | ICD-10-CM | POA: Diagnosis not present

## 2017-02-05 MED FILL — OMEPRAZOLE DR 20 MG CAPSULE: 20 | 90 days supply | Qty: 90 | Fill #0

## 2017-02-05 MED FILL — ROSUVASTATIN CALCIUM 20 MG: 20 | 90 days supply | Qty: 90 | Fill #0

## 2017-02-05 MED FILL — AMLODIPINE BESYLATE 10 MG T: 10 | 90 days supply | Qty: 90 | Fill #0

## 2017-02-13 DIAGNOSIS — M1711 Unilateral primary osteoarthritis, right knee: Secondary | ICD-10-CM | POA: Diagnosis not present

## 2017-02-17 DIAGNOSIS — M1711 Unilateral primary osteoarthritis, right knee: Secondary | ICD-10-CM | POA: Diagnosis not present

## 2017-02-17 MED FILL — traMADol HCL 50 MG TABS: 50 | 12 days supply | Qty: 50 | Fill #0

## 2017-03-01 NOTE — Progress Notes (Signed)
[facility]  PATIENT: Anna Hoffman DOB: 21-Dec-1934   REASON FOR VISIT: Follow-up for intra cerebral hemorrhage November 2017 HISTORY FROM: Patient    HISTORY OF PRESENT ILLNESS: UPDATE 07/30/2018CM Anna Hoffman, 82 year old female returns for follow-up with history of intracerebral hemorrhage November 2017. Repeat CT angiogram 09/05/16  showing satisfactory resolution of the brain hemorrhage but showing multivessel stenosis which places her at risk for stroke and recommend she continue aspirin and aggressive control of hypertension, sugars and cholesterol. She is currently on aspirin for secondary stroke prevention without further stroke or TIA symptoms. She has minimal bruising and no bleeding. In addition she is on Crestor for hyperlipidemia. She denies myalgias. Blood pressure in the office today 131/71. Her left hand is in a cast due to a fracture. She fell at home. She lives at home with her son. She remains fairly independent. 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 She refuses to get knee replacement and periodically gets injections to her knees. She returns for reevaluation  HISTORY 09/02/16 PS81 year pleasant African-American lady seen today for first office follow-up visit after hospital admission for intracerebral hemorrhage in November 2017. History is obtained from the patient and review of hospital records.Anna Hoffman a 82 y.o.femaleWho was in her normal state of health until around 4:30PM. She states that she had sudden onset of dizziness this, unsteadiness, headache that started at this time. She continued to have these problems and presented to the [facility] long emergency room where CT scan was performed which demonstrated a 1.5 x 1.2 cm a thalamic hemorrhage without intraventricular extension or hydrocephalus. She was hypertensive with Bp 185/105 and therefore started on IV Cleviprex and then transferred to [facility] neuro ICU for  further management.  She denies any recent trauma, denies taking any blood thinners. LKW: 4:30 PM tpa given?: no, ICH ICH Score: 0    she was admitted to the intensive care unit where blood pressure was tightly controlled and she was closely monitored. Follow-up CT scan showed stable appearance of the hemorrhage without increase in size or hydrocephalus. Transthoracic echo showed normal ejection fraction. LDL cholesterol 124 mg percent. Hemoglobin A1c was 6.0. Patient was seen by physical occupational speech therapy and felt to be candidate for inpatient rehabilitation due to her unsteady gait and dizziness. She is transferred to inpatient rehabilitation where she did well and was discharged after 10 days. She is currently living at home. She states she recovered fully and has no residual neurological deficits. She states her blood pressure is well controlled and today it is 135/72. Patient has some trouble walking mostly due to right knee pain. She recently had underwent injection into the right knee for pain relief. She is refusing to have knee replacement none. She has seen an eye doctor who has asked her to come back in a few weeks to discuss glasses to improve her vision. She is fully independent and lives at home with her son. 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 She is presently getting home physical and occupation therapy but this is about to end.   REVIEW OF SYSTEMS: Full 14 system review of systems performed and notable only for those listed, all others are neg:  Constitutional: neg  Cardiovascular: neg Ear/Nose/Throat: neg  Skin: neg Eyes: neg Respiratory: neg Gastroitestinal: neg  Hematology/Lymphatic: neg  Endocrine: neg Musculoskeletal:neg  Allergy/Immunology: neg Neurological: neg Psychiatric: neg Sleep : neg   ALLERGIES: Allergies  Allergen Reactions  . Amoxicillin Diarrhea    Has patient had a PCN reaction  causing immediate rash, facial/tongue/throat swelling, SOB or lightheadedness with hypotension: no Has patient had a PCN reaction causing severe rash involving mucus membranes or skin necrosis: no Has patient had a PCN reaction that required hospitalization: no pharmacist consult Has patient had a PCN reaction occurring within the last 10 years: yes If all of the above answers are "NO", then may proceed with Cephalosporin use.   . Ampicillin Rash    Has patient had a PCN reaction causing immediate rash, facial/tongue/throat swelling, SOB or lightheadedness with hypotension: No Has patient had a PCN reaction causing severe rash involving mucus membranes or skin necrosis: No Has patient had a PCN reaction that required hospitalization No Has patient had a PCN reaction occurring within the last 10 years: No If all of the above answers are "NO", then may proceed with Cephalosporin use.   . Sulfa Antibiotics Rash    HOME MEDICATIONS: Outpatient Medications Prior to Visit  Medication Sig Dispense Refill  . acetaminophen (TYLENOL) 325 MG tablet Take 2 tablets (650 mg total) by mouth every 6 (six) hours as needed for mild pain (or Fever >/= 101).    Marland Kitchen amLODipine (NORVASC) 5 MG tablet Take 1 tablet (5 mg total) by mouth daily. 30 tablet 1  . Ascorbic Acid (VITAMIN C) 1000 MG tablet Take 1,000 mg by mouth daily.    . diclofenac sodium (VOLTAREN) 1 % GEL Apply 2 g topically 3 (three) times daily. 1 Tube 1  . fexofenadine (ALLEGRA) 180 MG tablet Take 180 mg by mouth daily.    . irbesartan (AVAPRO) 150 MG tablet Take 1 tablet (150 mg total) by mouth daily. 30 tablet 1  . Multiple Vitamins-Minerals (STRESS TAB NF PO) Take 1 tablet by mouth daily.    Marland Kitchen omeprazole (PRILOSEC) 40 MG capsule Take 1 capsule (40 mg total) by mouth daily. 30 capsule 1  . rosuvastatin (CRESTOR) 10 MG tablet Take 1 tablet (10 mg total) by mouth daily. 30 tablet 1  . vitamin B-12 (CYANOCOBALAMIN) 1000 MCG tablet Take 1,000 mcg by  mouth daily.     No facility-administered medications prior to visit.     PAST MEDICAL HISTORY: Past Medical History:  Diagnosis Date  . Diabetes mellitus without complication ([place])   . Hypertension   . Hypokalemia   . Stroke [facility])     PAST SURGICAL HISTORY: Past Surgical History:  Procedure Laterality Date  . ABDOMINAL HYSTERECTOMY    . ESOPHAGOGASTRODUODENOSCOPY N/A 09/01/2014   Procedure: ESOPHAGOGASTRODUODENOSCOPY (EGD);  Surgeon: Lear Ng, MD;  Location: [facility];  Service: Endoscopy;  Laterality: N/A;  . HIATAL HERNIA REPAIR N/A 09/04/2014   Procedure: LAPAROSCOPIC REPAIR OF HIATAL HERNIA;  Surgeon: Excell Seltzer, MD;  Location: [facility];  Service: General;  Laterality: N/A;  With MESH  . IR GENERIC HISTORICAL  04/10/2016   IR US GUIDE VASC ACCESS RIGHT 04/10/2016 Corrie Mckusick, DO [facility]  . IR GENERIC HISTORICAL  04/10/2016   IR ANGIOGRAM SELECTIVE EACH ADDITIONAL VESSEL 04/10/2016 Corrie Mckusick, DO [facility]  . IR GENERIC HISTORICAL  04/10/2016   IR ANGIOGRAM SELECTIVE EACH ADDITIONAL VESSEL 04/10/2016 Corrie Mckusick, DO [facility]  . IR GENERIC HISTORICAL  04/10/2016   IR EMBO TUMOR ORGAN ISCHEMIA INFARCT INC GUIDE ROADMAPPING 04/10/2016 Corrie Mckusick, DO [facility]  . IR GENERIC HISTORICAL  04/10/2016  IR ANGIOGRAM SELECTIVE EACH ADDITIONAL VESSEL 04/10/2016 Corrie Mckusick, DO [facility]  . IR GENERIC HISTORICAL  04/10/2016   IR ANGIOGRAM SELECTIVE EACH ADDITIONAL VESSEL 04/10/2016 Corrie Mckusick, DO [facility]  . IR GENERIC HISTORICAL  04/10/2016   IR RENAL SELECTIVE  UNI INC S&I MOD SED 04/10/2016 Corrie Mckusick, DO [facility]  . IR GENERIC HISTORICAL  03/06/2016   IR RADIOLOGIST EVAL & MGMT 03/06/2016 Corrie Mckusick, DO [facility]  . IR GENERIC HISTORICAL  03/26/2016   IR RADIOLOGIST EVAL & MGMT 03/26/2016 Corrie Mckusick, DO [facility]  . IR GENERIC HISTORICAL  04/29/2016   IR RADIOLOGIST EVAL & MGMT 04/29/2016 [facility]  . IR RADIOLOGIST EVAL & MGMT   11/12/2016  . KNEE SURGERY      FAMILY HISTORY: Family History  Problem Relation Age of Onset  . Prostate cancer Father     SOCIAL HISTORY: Social History   Social History  . Marital status: Widowed    Spouse name: N/A  . Number of children: 2  . Years of education: N/A   Occupational History  . Not on file.   Social History Main Topics  . Smoking status: Never Smoker  . Smokeless tobacco: Never Used  . Alcohol use No  . Drug use: No  . Sexual activity: No   Other Topics Concern  . Not on file   Social History Narrative   Son [name] lives with her     PHYSICAL EXAM  Vitals:   03/02/17 0921  BP: 131/71  Pulse: 99  Weight: 141 lb (64 kg)   Body mass index is 23.11 kg/m.  Generalized: Well developed, in no acute distress  Head: normocephalic and atraumatic,. Oropharynx benign  Neck: Supple, no carotid bruits  Cardiac: Regular rate rhythm, no murmur  Musculoskeletal: No deformity   Neurological examination   Mentation: Alert oriented to time, place, history taking. Attention span and concentration appropriate. Recent and remote memory intact.  Follows all commands speech and language fluent.   Cranial nerve II-XII: Fundoscopic exam reveals sharp disc margins.Pupils were equal round reactive to light extraocular movements were full, visual field were full on confrontational test. Facial sensation and strength were normal. hearing was intact to finger rubbing bilaterally. Uvula tongue midline. head turning and shoulder shrug were normal and symmetric.Tongue protrusion into cheek strength was normal. Motor: normal bulk and tone, full strength in the BUE, BLE, fine finger movements normal, no pronator drift. No focal weakness. Left hand in cast Sensory: normal and symmetric to light touch, pinprick, and  Vibration, in the upper and lower extremities  Coordination: finger-nose-finger, heel-to-shin bilaterally, no dysmetria Reflexes: 1+ upper lower and symmetric  plantar responses were flexor bilaterally. Gait and Station: Rising up from seated position without assistance, normal stance,  moderate stride, favors right knee while walking , able to perform tiptoe, and heel walking without difficulty. Tandem gait is steady  DIAGNOSTIC DATA (LABS, IMAGING, TESTING) - I reviewed patient records, labs, notes, testing and imaging myself where available.  Lab Results  Component Value Date   WBC 8.1 07/02/2016   HGB 12.4 07/02/2016   HCT 37.4 07/02/2016   MCV 91.2 07/02/2016   PLT 359 07/02/2016      Component Value Date/Time   NA 135 07/02/2016 0525   K 4.5 07/02/2016 0525   CL 105 07/02/2016 0525   CO2 22 07/02/2016 0525   GLUCOSE 103 (H) 07/02/2016 0525   BUN 22 (H) 07/02/2016 0525   CREATININE  1.20 (H) 11/12/2016 0817   CALCIUM 9.4 07/02/2016 0525   PROT 5.7 (L) 07/02/2016 0525   ALBUMIN 3.3 (L) 07/02/2016 0525   AST 20 07/02/2016 0525   ALT 20 07/02/2016 0525   ALKPHOS 64 07/02/2016 0525   BILITOT 0.4 07/02/2016 0525   GFRNONAA 52 (L) 07/02/2016 0525   GFRAA 60 (L) 07/02/2016 0525   Lab Results  Component Value Date   CHOL 224 (H) 06/29/2016   HDL 64 06/29/2016   LDLCALC 124 (H) 06/29/2016   TRIG 180 (H) 06/29/2016   CHOLHDL 3.5 06/29/2016   Lab Results  Component Value Date   HGBA1C 6.0 (H) 06/29/2016    ASSESSMENT AND PLAN 49 year African-American lady with small right thalamic intracerebral hemorrhage in November 2017 of hypertensive etiology. She is doing remarkably well.Repeat CT angiogram 09/05/16  showing satisfactory resolution of the brain hemorrhage but showing multivessel stenosis which places her at risk for stroke  Stressed the importance of management of risk factors to prevent further stroke Continue aspirin for secondary stroke prevention Maintain strict control of hypertension with blood pressure goal below 130/90, today's reading 131/71 continue antihypertensive medications Cholesterol with LDL cholesterol less  than 70, followed by primary care,  continue statin drugs Crestor Exercise by walking, slowly increase , use cane for safe ambulation eat healthy diet with whole grains,  fresh fruits and vegetables Will discharge  I spent 25 min  in total face to face time with the patient more than 50% of which was spent counseling and coordination of care, reviewing test results reviewing medications and discussing and reviewing the diagnosis of stroke and management of risk factors. Dennie Bible, [facility], [facility], APRN  [facility] [address], [place], [place] [address] [phone]

## 2017-03-02 ENCOUNTER — Ambulatory Visit (INDEPENDENT_AMBULATORY_CARE_PROVIDER_SITE_OTHER): Payer: Medicare Other | Admitting: Nurse Practitioner

## 2017-03-02 ENCOUNTER — Encounter: Payer: Self-pay | Admitting: Nurse Practitioner

## 2017-03-02 VITALS — BP 131/71 | HR 99 | Wt 141.0 lb

## 2017-03-02 DIAGNOSIS — I10 Essential (primary) hypertension: Secondary | ICD-10-CM | POA: Diagnosis not present

## 2017-03-02 DIAGNOSIS — I610 Nontraumatic intracerebral hemorrhage in hemisphere, subcortical: Secondary | ICD-10-CM

## 2017-03-02 DIAGNOSIS — E782 Mixed hyperlipidemia: Secondary | ICD-10-CM | POA: Diagnosis not present

## 2017-03-02 MED FILL — IRBESARTAN 150 MG TABLET: 150 | 90 days supply | Qty: 90 | Fill #0

## 2017-03-02 NOTE — Progress Notes (Signed)
I agree with the above plan 

## 2017-03-02 NOTE — Patient Instructions (Addendum)
Stressed the importance of management of risk factors to prevent further stroke Continue aspirin for secondary stroke prevention Maintain strict control of hypertension with blood pressure goal below 130/90, today's reading 131/71 continue antihypertensive medications Cholesterol with LDL cholesterol less than 70, followed by primary care,  continue statin drugs Crestor Exercise by walking, slowly increase , use cane for safe ambulation eat healthy diet with whole grains,  fresh fruits and vegetables Will discharge  Stroke Prevention Some medical conditions and behaviors are associated with an increased chance of having a stroke. You may prevent a stroke by making healthy choices and managing medical conditions. How can I reduce my risk of having a stroke?  Stay physically active. Get at least 30 minutes of activity on most or all days.  Do not smoke. It may also be helpful to avoid exposure to secondhand smoke.  Limit alcohol use. Moderate alcohol use is considered to be: ? No more than 2 drinks per day for men. ? No more than 1 drink per day for nonpregnant women.  Eat healthy foods. This involves: ? Eating 5 or more servings of fruits and vegetables a day. ? Making dietary changes that address high blood pressure (hypertension), high cholesterol, diabetes, or obesity.  Manage your cholesterol levels. ? Making food choices that are high in fiber and low in saturated fat, trans fat, and cholesterol may control cholesterol levels. ? Take any prescribed medicines to control cholesterol as directed by your health care provider.  Manage your diabetes. ? Controlling your carbohydrate and sugar intake is recommended to manage diabetes. ? Take any prescribed medicines to control diabetes as directed by your health care provider.  Control your hypertension. ? Making food choices that are low in salt (sodium), saturated fat, trans fat, and cholesterol is recommended to manage  hypertension. ? Ask your health care provider if you need treatment to lower your blood pressure. Take any prescribed medicines to control hypertension as directed by your health care provider. ? If you are 64-56 years of age, have your blood pressure checked every 3-5 years. If you are 1 years of age or older, have your blood pressure checked every year.  Maintain a healthy weight. ? Reducing calorie intake and making food choices that are low in sodium, saturated fat, trans fat, and cholesterol are recommended to manage weight.  Stop drug abuse.  Avoid taking birth control pills. ? Talk to your health care provider about the risks of taking birth control pills if you are over 65 years old, smoke, get migraines, or have ever had a blood clot.  Get evaluated for sleep disorders (sleep apnea). ? Talk to your health care provider about getting a sleep evaluation if you snore a lot or have excessive sleepiness.  Take medicines only as directed by your health care provider. ? For some people, aspirin or blood thinners (anticoagulants) are helpful in reducing the risk of forming abnormal blood clots that can lead to stroke. If you have the irregular heart rhythm of atrial fibrillation, you should be on a blood thinner unless there is a good reason you cannot take them. ? Understand all your medicine instructions.  Make sure that other conditions (such as anemia or atherosclerosis) are addressed. Get help right away if:  You have sudden weakness or numbness of the face, arm, or leg, especially on one side of the body.  Your face or eyelid droops to one side.  You have sudden confusion.  You have trouble speaking (aphasia) or  understanding.  You have sudden trouble seeing in one or both eyes.  You have sudden trouble walking.  You have dizziness.  You have a loss of balance or coordination.  You have a sudden, severe headache with no known cause.  You have new chest pain or an  irregular heartbeat. Any of these symptoms may represent a serious problem that is an emergency. Do not wait to see if the symptoms will go away. Get medical help at once. Call your local emergency services (911 in [place] Do not drive yourself to the hospital. This information is not intended to replace advice given to you by your health care provider. Make sure you discuss any questions you have with your health care provider. Document Released: 08/28/2004 Document Revised: 12/27/2015 Document Reviewed: 01/21/2013 Elsevier Interactive Patient Education  2017 [employer].

## 2017-03-03 DIAGNOSIS — M79642 Pain in left hand: Secondary | ICD-10-CM | POA: Diagnosis not present

## 2017-03-20 DIAGNOSIS — M79642 Pain in left hand: Secondary | ICD-10-CM | POA: Diagnosis not present

## 2017-05-05 DIAGNOSIS — M79641 Pain in right hand: Secondary | ICD-10-CM | POA: Diagnosis not present

## 2017-05-05 DIAGNOSIS — M25532 Pain in left wrist: Secondary | ICD-10-CM | POA: Diagnosis not present

## 2017-05-06 MED FILL — OMEPRAZOLE 20 MG CAP: 20 | 90 days supply | Qty: 90 | Fill #1

## 2017-05-06 MED FILL — ROSUVASTATIN CALCIUM 20 MG: 20 | 90 days supply | Qty: 90 | Fill #0

## 2017-05-06 MED FILL — AMLODIPINE BESYLATE 10 MG T: 10 | 90 days supply | Qty: 90 | Fill #1

## 2017-05-07 ENCOUNTER — Other Ambulatory Visit: Payer: Self-pay | Admitting: Internal Medicine

## 2017-05-07 DIAGNOSIS — Z1231 Encounter for screening mammogram for malignant neoplasm of breast: Secondary | ICD-10-CM

## 2017-05-20 ENCOUNTER — Ambulatory Visit
Admission: RE | Admit: 2017-05-20 | Discharge: 2017-05-20 | Disposition: A | Discharge status: Home / Self Care | Payer: Medicare Other | Source: Ambulatory Visit | Attending: Internal Medicine | Admitting: Internal Medicine

## 2017-05-20 DIAGNOSIS — Z1231 Encounter for screening mammogram for malignant neoplasm of breast: Secondary | ICD-10-CM

## 2017-05-28 DIAGNOSIS — Z0000 Encounter for general adult medical examination without abnormal findings: Secondary | ICD-10-CM | POA: Diagnosis not present

## 2017-05-28 DIAGNOSIS — I129 Hypertensive chronic kidney disease with stage 1 through stage 4 chronic kidney disease, or unspecified chronic kidney disease: Secondary | ICD-10-CM | POA: Diagnosis not present

## 2017-05-28 DIAGNOSIS — E1122 Type 2 diabetes mellitus with diabetic chronic kidney disease: Secondary | ICD-10-CM | POA: Diagnosis not present

## 2017-05-28 DIAGNOSIS — N183 Chronic kidney disease, stage 3 (moderate): Secondary | ICD-10-CM | POA: Diagnosis not present

## 2017-05-28 DIAGNOSIS — E559 Vitamin D deficiency, unspecified: Secondary | ICD-10-CM | POA: Diagnosis not present

## 2017-05-28 DIAGNOSIS — Z23 Encounter for immunization: Secondary | ICD-10-CM | POA: Diagnosis not present

## 2017-06-01 MED FILL — IRBESARTAN 150 MG TABLET: 150 | 90 days supply | Qty: 90 | Fill #1

## 2017-06-03 DIAGNOSIS — J0100 Acute maxillary sinusitis, unspecified: Secondary | ICD-10-CM | POA: Diagnosis not present

## 2017-06-03 MED FILL — BENZONATATE 100 MG CAPS: 100 | 10 days supply | Qty: 30 | Fill #0

## 2017-06-04 MED FILL — VIT D2 1.25 MG (50,000 UNIT: 1.25 MG | 28 days supply | Qty: 8 | Fill #0

## 2017-06-28 IMAGING — CR DG ABDOMEN ACUTE W/ 1V CHEST
3 series · 3 of 3 positions shown · non-contrast
Comparison: 09/03/2014

CLINICAL DATA: Sudden onset of mid abdominal pain and back pain
starting this morning, vomiting, diarrhea

EXAM:
DG ABDOMEN ACUTE W/ 1V CHEST

[w chest pa]
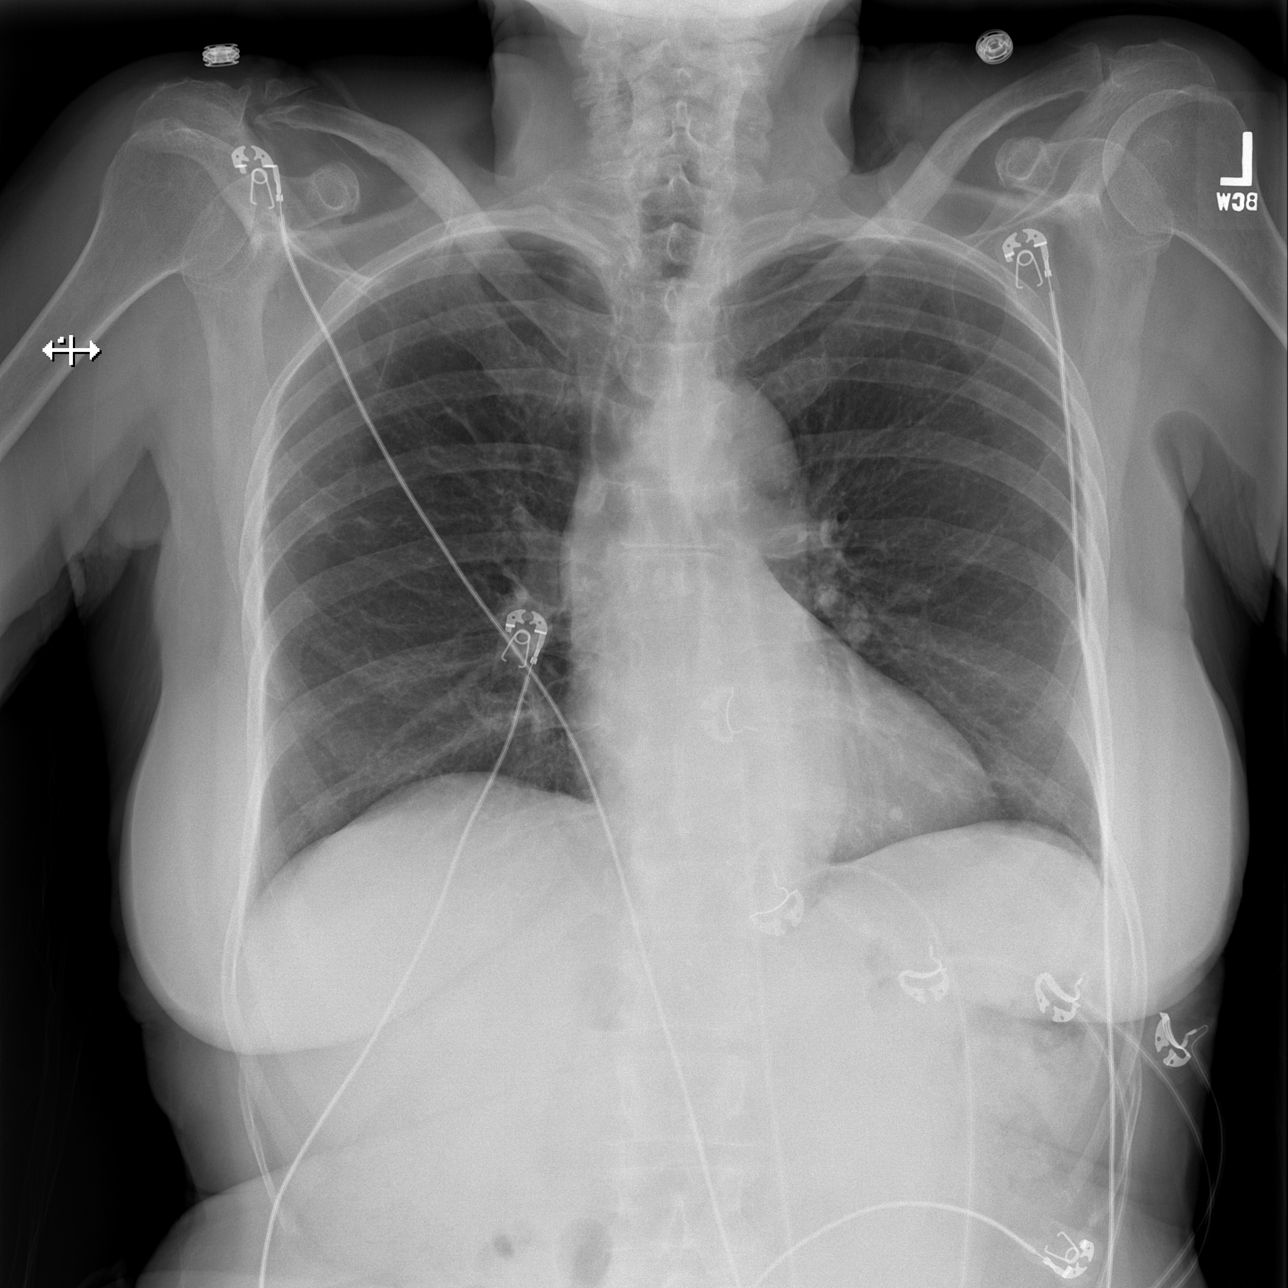

[w abdomen upright]
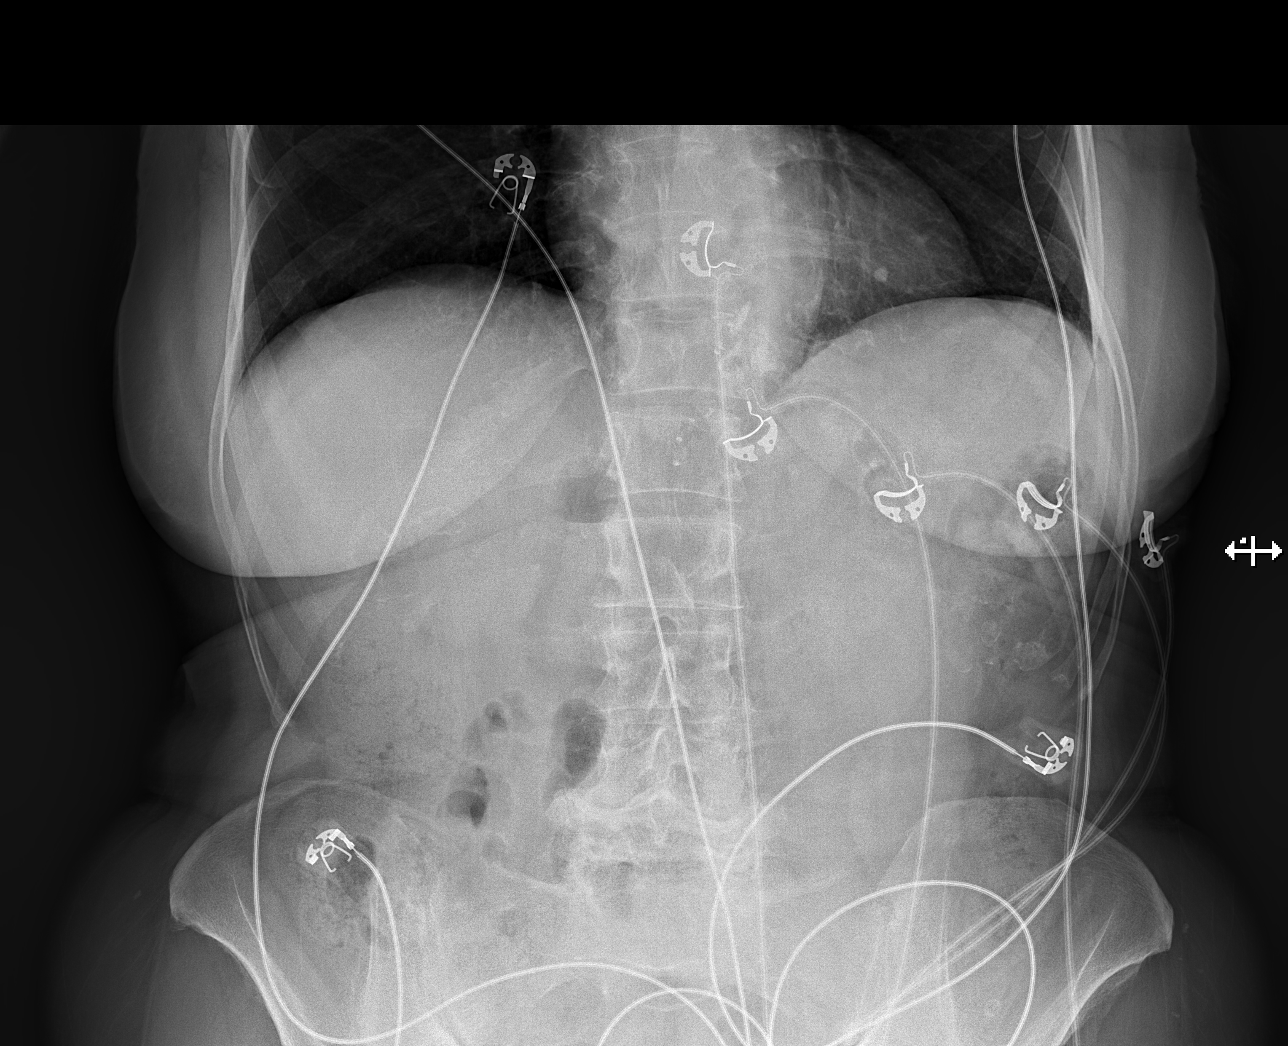

[t abdomen supine]
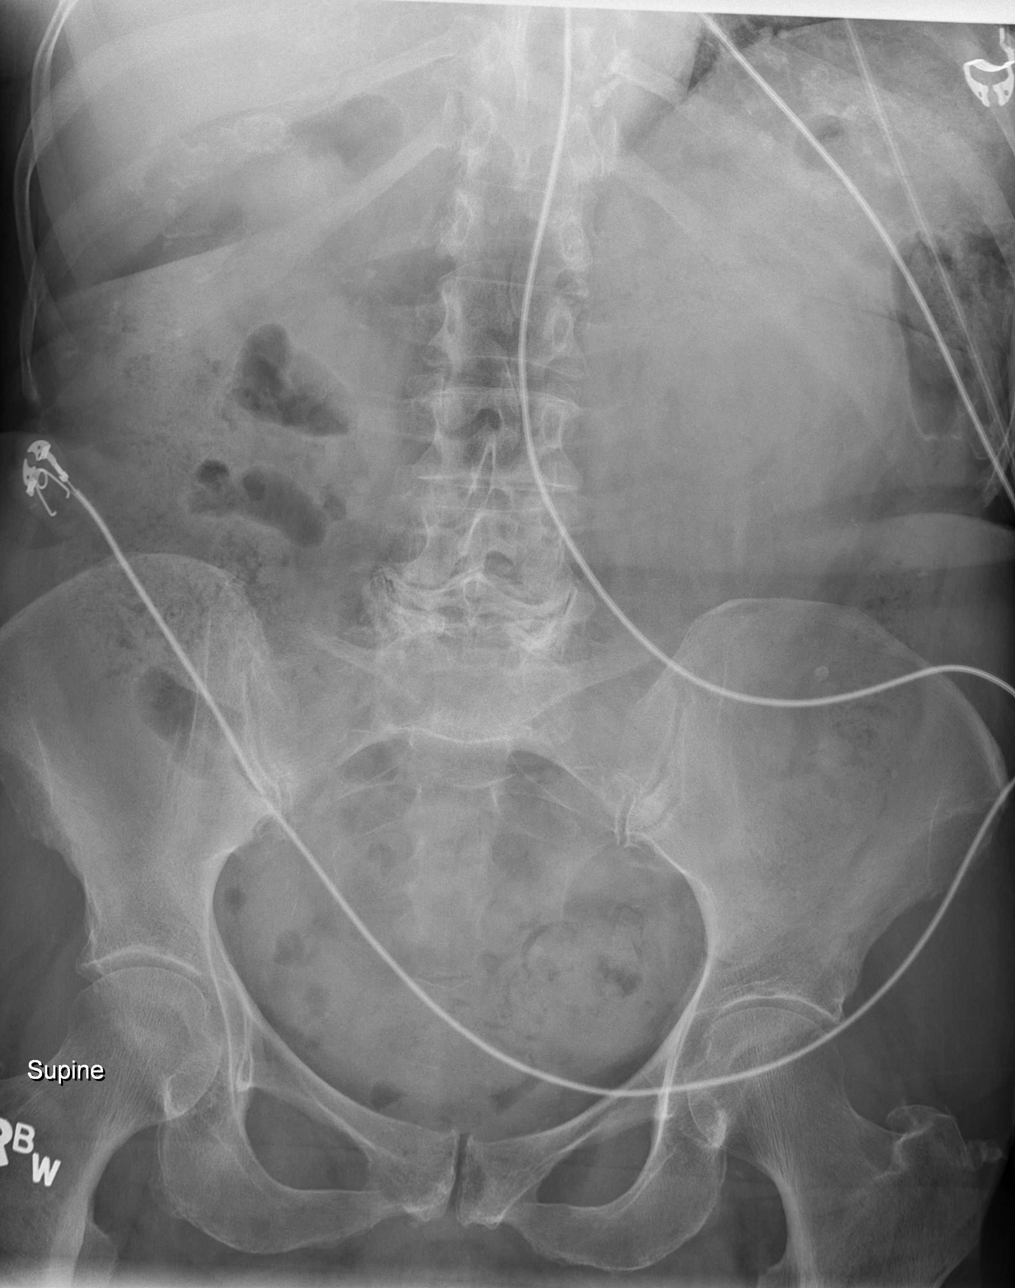

[3 of 3 positions shown; findings below may reference images not displayed]

FINDINGS: Cardiomediastinal silhouette is unremarkable. No acute infiltrate or
pleural effusion. No pulmonary edema. There is normal small bowel
gas pattern. Moderate stool noted in right colon. Degenerative
changes are noted lower lumbar spine. No evidence of free abdominal
air.
IMPRESSION: No acute disease. Moderate stool noted in right colon. No free
abdominal air. Normal small bowel gas pattern.

## 2017-06-28 IMAGING — CT CT ABD-PELV W/ CM
2 of 5 series · 16 of 46 positions shown, 18 images · IV contrast (iopamidol)
Comparison: Radiography same day. CT 09/01/2014 abdominal MRI
05/28/2005

ADDENDUM:
Critical Value/emergent results were called by telephone at the time
of interpretation on 02/04/2016 at [DATE] to Dr. Azar , who
verbally acknowledged these results.
CLINICAL DATA: Nausea and vomiting beginning today. Diarrhea and
loose stool. History of paraesophageal hernia. Pain most severe in
the left lower quadrant.

EXAM:
CT ABDOMEN AND PELVIS WITH CONTRAST
TECHNIQUE: Multidetector CT imaging of the abdomen and pelvis was performed
using the standard protocol following bolus administration of
intravenous contrast.
CONTRAST:  75mL [id number] IOPAMIDOL ([id number]) INJECTION 61%

[Series 3: rtn a/p with · axial · 0.80mm/px · z∈[+1227,+1607]mm · 13 of 88 slices shown, 15 images]
[im 6/88  soft-tissue]
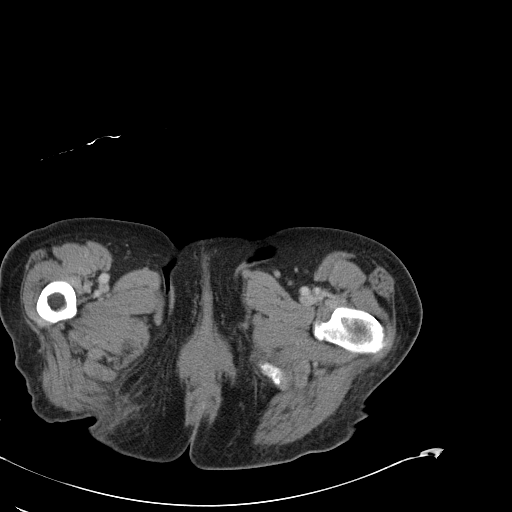
[im 6/88  bone]
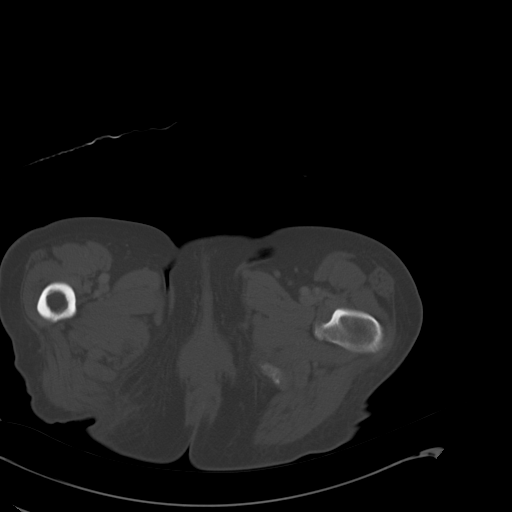
[im 12/88  soft-tissue]
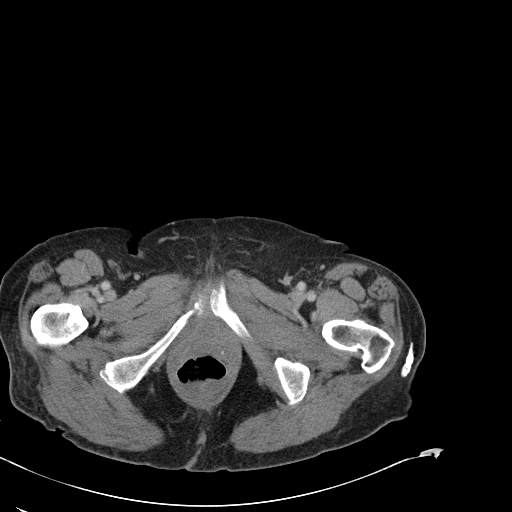
[im 18/88  soft-tissue]
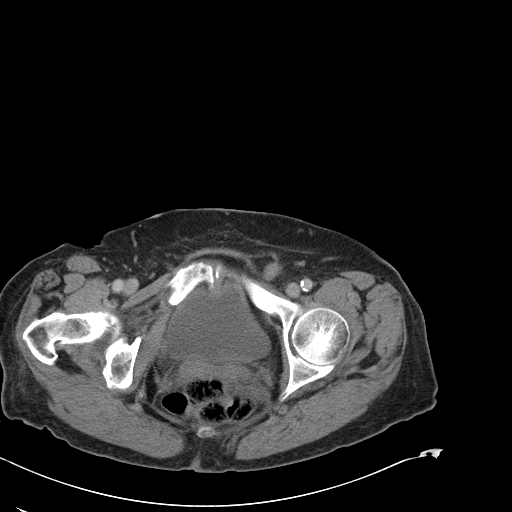
[im 24/88  soft-tissue]
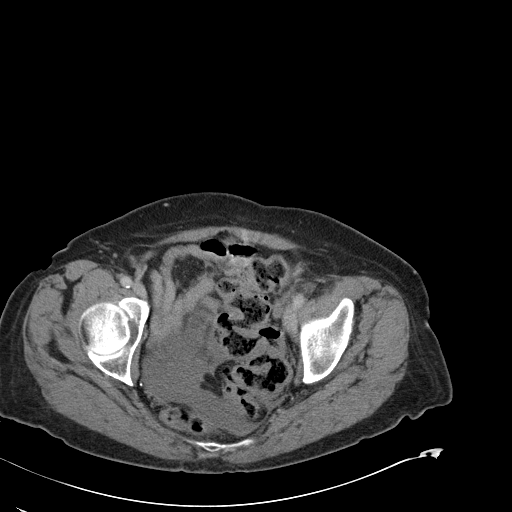
[im 30/88  soft-tissue]
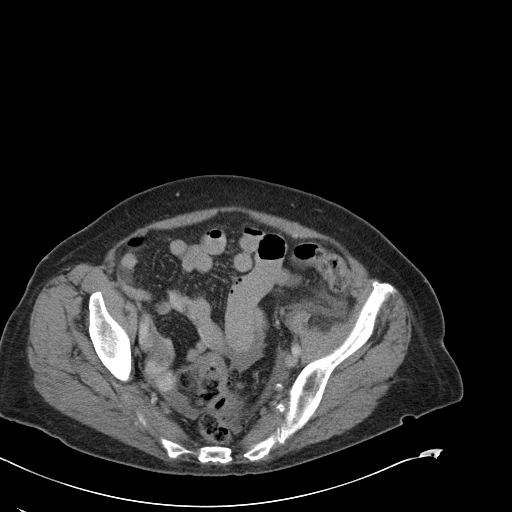
[im 35/88  soft-tissue]
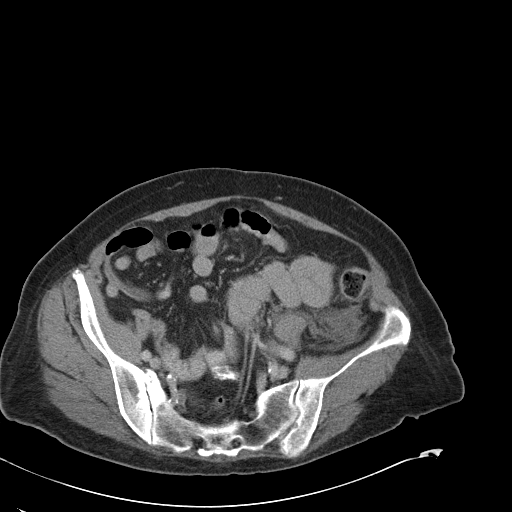
[im 47/88  soft-tissue]
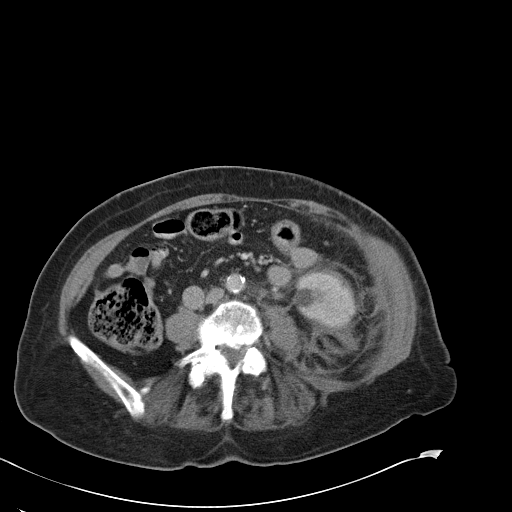
[im 53/88  soft-tissue]
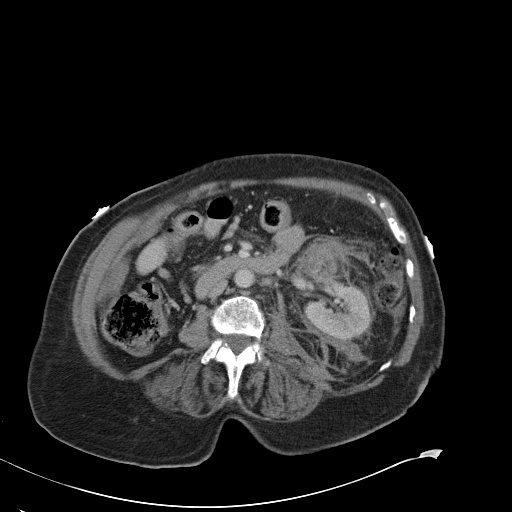
[im 59/88  soft-tissue]
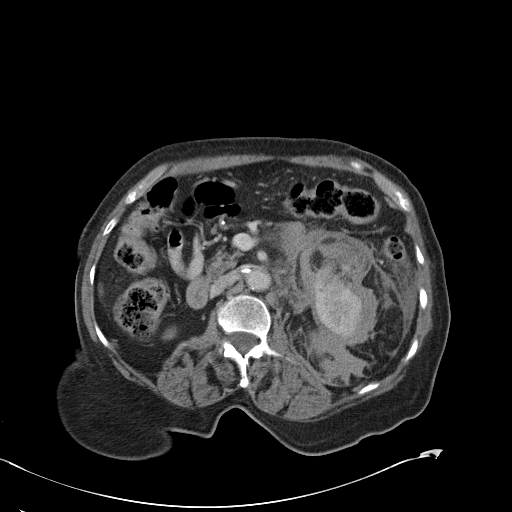
[im 59/88  bone]
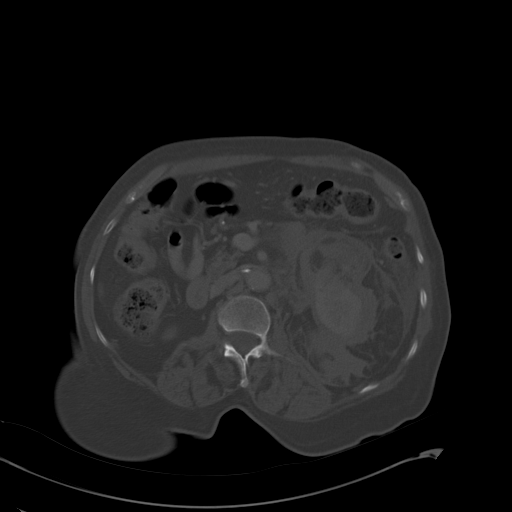
[im 64/88  soft-tissue]
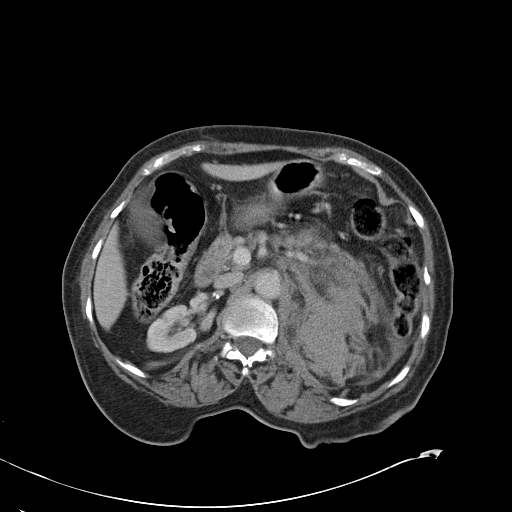
[im 70/88  soft-tissue]
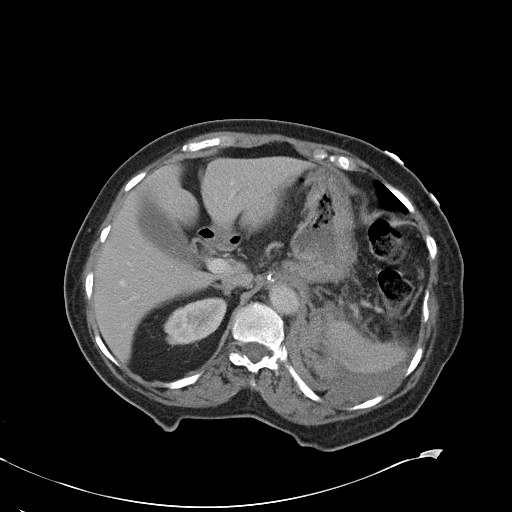
[im 76/88  soft-tissue]
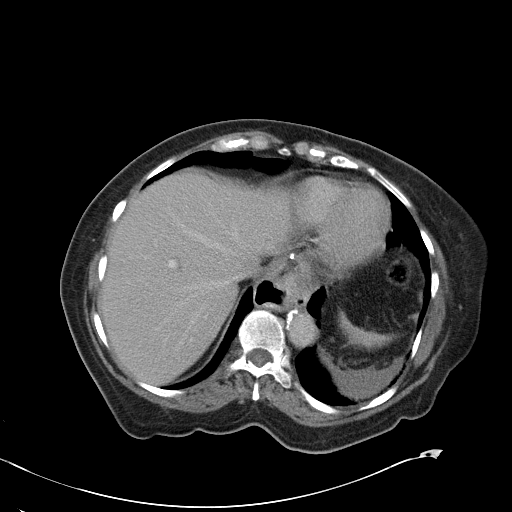
[im 82/88  soft-tissue]
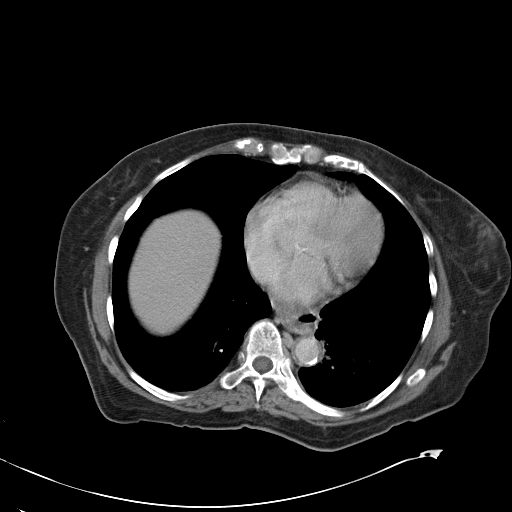

[Series 602: <mpr thick range> · coronal · 0.85mm/px · 3 of 122 slices shown]
[im 41/122  soft-tissue]
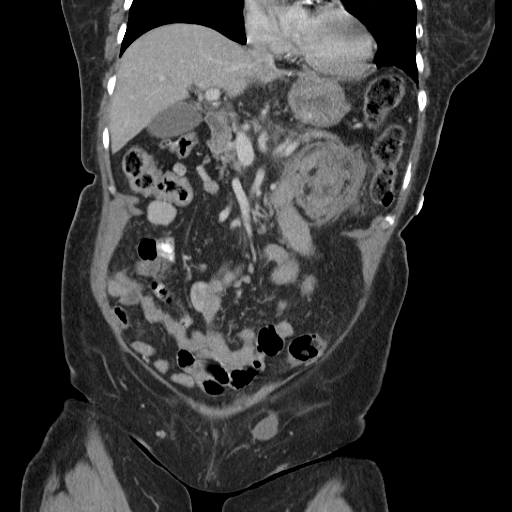
[im 54/122  soft-tissue]
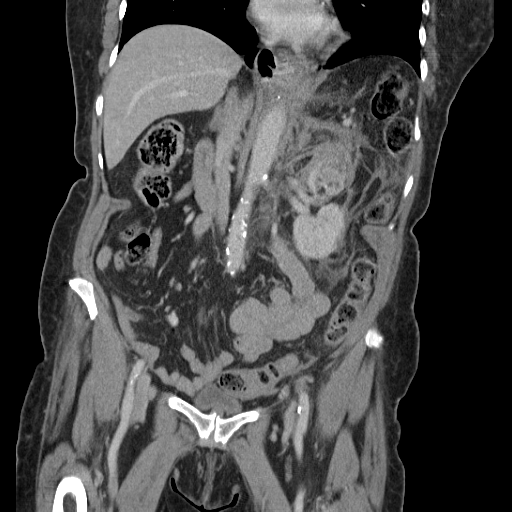
[im 68/122  soft-tissue]
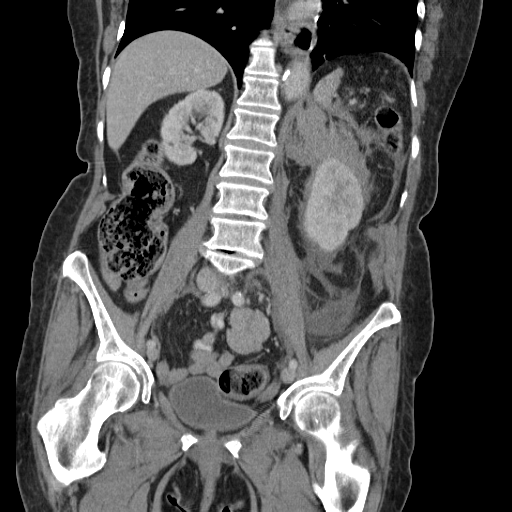

[16 of 46 positions shown; findings below may reference images not displayed]

FINDINGS: There is a large acute retroperitoneal hemorrhage on the left
apparently arising from a heterogeneous 3.5 x 4.5 cm mass of the
left upper pole ventral kidney with prominent fat components. Most
likely diagnosis is angiomyolipoma, as I believe there was a lesion
present in this area in 6556, making renal cell carcinoma quite
unlikely. Most of the hemorrhage is retroperitoneal, but there is
probably a small amount of intraperitoneal hemorrhage surrounding
the spleen and a moderate amount in the pelvic cul de sac.

Lung bases are clear. There is aortic atherosclerosis and coronary
artery calcification. The liver is normal. No calcified gallstones.
No intrinsic splenic lesion. No intrinsic pancreatic lesion. The
right kidney shows atrophy and arterial calcifications. In addition
to the findings above, the left kidney contains a 1 cm cyst in the
lower pole. Abdominal aortic atherosclerosis is present without
aneurysm. The IVC is normal. There has been previous hysterectomy.
No pelvic mass. The bladder is unremarkable. Small inguinal hernia
on the left. Small hiatal hernia. No primary bowel pathology is
identified. There is advanced degenerative disease of the lumbar
spine including chronic anterolisthesis at L5-S1 and S1-S2.
IMPRESSION: Acute retroperitoneal hemorrhage on the left probably secondary to
an angiomyolipoma of the left kidney. This is estimated at about
x 4.5 cm in size. Retroperitoneal hemorrhage does show
intraperitoneal extension in the left upper quadrant around the
spleen and in the pelvic cul de sac.

Where are in the process of emergency call report but have not
successfully located the covering physician at this time.

Aortic atherosclerosis.  Coronary artery calcifications.

## 2017-07-16 DIAGNOSIS — I440 Atrioventricular block, first degree: Secondary | ICD-10-CM | POA: Diagnosis not present

## 2017-07-16 DIAGNOSIS — R42 Dizziness and giddiness: Secondary | ICD-10-CM | POA: Diagnosis not present

## 2017-08-05 MED FILL — OMEPRAZOLE 20 MG CAP: 20 | 90 days supply | Qty: 90 | Fill #0

## 2017-08-05 MED FILL — AMLODIPINE BESYLATE 10 MG T: 10 | 90 days supply | Qty: 90 | Fill #0

## 2017-08-05 MED FILL — ROSUVASTATIN CALCIUM 20 MG: 20 | 90 days supply | Qty: 90 | Fill #0

## 2017-08-17 DIAGNOSIS — M170 Bilateral primary osteoarthritis of knee: Secondary | ICD-10-CM | POA: Diagnosis not present

## 2017-08-19 ENCOUNTER — Ambulatory Visit: Payer: Medicare Other | Admitting: Cardiovascular Disease

## 2017-08-19 ENCOUNTER — Encounter: Payer: Self-pay | Admitting: Cardiovascular Disease

## 2017-08-19 DIAGNOSIS — E782 Mixed hyperlipidemia: Secondary | ICD-10-CM

## 2017-08-19 DIAGNOSIS — I10 Essential (primary) hypertension: Secondary | ICD-10-CM

## 2017-08-19 NOTE — Assessment & Plan Note (Signed)
History of hyperlipidemia on statin therapy by her PCP

## 2017-08-19 NOTE — Assessment & Plan Note (Signed)
History of essential hypertension blood pressure today 132/60. She is on amlodipine and Avapro. Continue current meds at current dosing.

## 2017-08-19 NOTE — Patient Instructions (Signed)
Medication Instructions: Your physician recommends that you continue on your current medications as directed. Please refer to the Current Medication list given to you today.   Follow-Up: Your physician recommends that you schedule a follow-up appointment as needed with Dr. Berry.    

## 2017-08-19 NOTE — Progress Notes (Signed)
08/19/2017 Coline Calkin Lear   [birth year]  [id number]  Primary Physician Glendale Chard, MD Primary Cardiologist: Lorretta Harp MD Lupe Carney, [place]  HPI:  Anna Hoffman is a 83 y.o. widowed African-American female mother of 2, grandmother 3 grandchildren referred by Dr. Baird Cancer because of dizziness. She has a history of treated hypertension and hyperlipidemia. She's never had a heart attack but did have a stroke December 2017. She denies chest pain or shortness of breath. She recently had vertigo and isn't placed on medicines for this. This has since resolved.    Current Meds  Medication Sig  . acetaminophen (TYLENOL) 325 MG tablet Take 2 tablets (650 mg total) by mouth every 6 (six) hours as needed for mild pain (or Fever >/= 101).  Marland Kitchen amLODipine (NORVASC) 5 MG tablet Take 1 tablet (5 mg total) by mouth daily.  . Ascorbic Acid (VITAMIN C) 1000 MG tablet Take 1,000 mg by mouth daily.  Marland Kitchen aspirin EC 81 MG tablet Take 81 mg by mouth daily.  . D3-50 50000 units capsule TAKE 1 CAPSULE BY MOUTH ONCE A WEEK FOR 6 WEEKS  . diclofenac sodium (VOLTAREN) 1 % GEL Apply 2 g topically 3 (three) times daily.  . fexofenadine (ALLEGRA) 180 MG tablet Take 180 mg by mouth daily.  . irbesartan (AVAPRO) 150 MG tablet Take 1 tablet (150 mg total) by mouth daily.  . Multiple Vitamins-Minerals (STRESS TAB NF PO) Take 1 tablet by mouth daily.  Marland Kitchen omeprazole (PRILOSEC) 40 MG capsule Take 1 capsule (40 mg total) by mouth daily.  . rosuvastatin (CRESTOR) 10 MG tablet Take 1 tablet (10 mg total) by mouth daily.  . traMADol (ULTRAM) 50 MG tablet Take 50 mg by mouth every 6 (six) hours as needed. for pain  . vitamin B-12 (CYANOCOBALAMIN) 1000 MCG tablet Take 1,000 mcg by mouth daily.     Allergies  Allergen Reactions  . Amoxicillin Diarrhea    Has patient had a PCN reaction causing immediate rash, facial/tongue/throat swelling, SOB or lightheadedness with hypotension: no Has patient had a PCN reaction causing  severe rash involving mucus membranes or skin necrosis: no Has patient had a PCN reaction that required hospitalization: no pharmacist consult Has patient had a PCN reaction occurring within the last 10 years: yes If all of the above answers are "NO", then may proceed with Cephalosporin use.   . Ampicillin Rash    Has patient had a PCN reaction causing immediate rash, facial/tongue/throat swelling, SOB or lightheadedness with hypotension: No Has patient had a PCN reaction causing severe rash involving mucus membranes or skin necrosis: No Has patient had a PCN reaction that required hospitalization No Has patient had a PCN reaction occurring within the last 10 years: No If all of the above answers are "NO", then may proceed with Cephalosporin use.   . Sulfa Antibiotics Rash    Social History   Socioeconomic History  . Marital status: Widowed    Spouse name: Not on file  . Number of children: 2  . Years of education: Not on file  . Highest education level: Not on file  Social Needs  . Financial resource strain: Not on file  . Food insecurity - worry: Not on file  . Food insecurity - inability: Not on file  . Transportation needs - medical: Not on file  . Transportation needs - non-medical: Not on file  Occupational History  . Not on file  Tobacco Use  . Smoking status: Never  Smoker  . Smokeless tobacco: Never Used  Substance and Sexual Activity  . Alcohol use: No  . Drug use: No  . Sexual activity: No    Birth control/protection: Surgical  Other Topics Concern  . Not on file  Social History Narrative   Son [name] lives with her     Review of Systems: General: negative for chills, fever, night sweats or weight changes.  Cardiovascular: negative for chest pain, dyspnea on exertion, edema, orthopnea, palpitations, paroxysmal nocturnal dyspnea or shortness of breath Dermatological: negative for rash Respiratory: negative for cough or wheezing Urologic: negative for  hematuria Abdominal: negative for nausea, vomiting, diarrhea, bright red blood per rectum, melena, or hematemesis Neurologic: negative for visual changes, syncope, or dizziness All other systems reviewed and are otherwise negative except as noted above.    Blood pressure 132/60, pulse 87, height 5\' 5"  (1.651 m), weight 135 lb (61.2 kg).  General appearance: alert and no distress Neck: no adenopathy, no carotid bruit, no JVD, supple, symmetrical, trachea midline and thyroid not enlarged, symmetric, no tenderness/mass/nodules Lungs: clear to auscultation bilaterally Heart: regular rate and rhythm, S1, S2 normal, no murmur, click, rub or gallop Extremities: extremities normal, atraumatic, no cyanosis or edema Pulses: 2+ and symmetric Skin: Skin color, texture, turgor normal. No rashes or lesions Neurologic: Alert and oriented X 3, normal strength and tone. Normal symmetric reflexes. Normal coordination and gait  EKG sinus rhythm 87 with ST or T wave changes. I personally reviewed this EKG.  ASSESSMENT AND PLAN:   Hypertension History of essential hypertension blood pressure today 132/60. She is on amlodipine and Avapro. Continue current meds at current dosing.  Mixed hyperlipidemia History of hyperlipidemia on statin therapy by her PCP      Lorretta Harp MD [facility], [facility] 08/19/2017 11:09 AM

## 2017-08-24 DIAGNOSIS — M170 Bilateral primary osteoarthritis of knee: Secondary | ICD-10-CM | POA: Diagnosis not present

## 2017-08-24 NOTE — Addendum Note (Signed)
Addended by: Jacqulynn Cadet on: 08/24/2017 12:57 PM   Modules accepted: Orders

## 2017-08-31 MED FILL — IRBESARTAN 150 MG TABLET: 150 | 90 days supply | Qty: 90 | Fill #0 | Status: TO

## 2017-09-01 DIAGNOSIS — M25572 Pain in left ankle and joints of left foot: Secondary | ICD-10-CM | POA: Diagnosis not present

## 2017-09-01 DIAGNOSIS — M170 Bilateral primary osteoarthritis of knee: Secondary | ICD-10-CM | POA: Diagnosis not present

## 2017-09-02 DIAGNOSIS — I129 Hypertensive chronic kidney disease with stage 1 through stage 4 chronic kidney disease, or unspecified chronic kidney disease: Secondary | ICD-10-CM | POA: Diagnosis not present

## 2017-09-02 DIAGNOSIS — N183 Chronic kidney disease, stage 3 (moderate): Secondary | ICD-10-CM | POA: Diagnosis not present

## 2017-09-02 DIAGNOSIS — Z79899 Other long term (current) drug therapy: Secondary | ICD-10-CM | POA: Diagnosis not present

## 2017-09-02 DIAGNOSIS — N08 Glomerular disorders in diseases classified elsewhere: Secondary | ICD-10-CM | POA: Diagnosis not present

## 2017-09-02 DIAGNOSIS — J029 Acute pharyngitis, unspecified: Secondary | ICD-10-CM | POA: Diagnosis not present

## 2017-09-02 DIAGNOSIS — E1122 Type 2 diabetes mellitus with diabetic chronic kidney disease: Secondary | ICD-10-CM | POA: Diagnosis not present

## 2017-10-29 MED FILL — ROSUVASTATIN CALCIUM 20 MG: 20 | 90 days supply | Qty: 90 | Fill #1

## 2017-10-29 MED FILL — AMLODIPINE BESYLATE 10 MG T: 10 | 90 days supply | Qty: 90 | Fill #1

## 2017-10-29 MED FILL — OMEPRAZOLE 20 MG CAP: 20 | 90 days supply | Qty: 90 | Fill #1

## 2017-11-04 DIAGNOSIS — E119 Type 2 diabetes mellitus without complications: Secondary | ICD-10-CM | POA: Diagnosis not present

## 2017-11-04 DIAGNOSIS — H04123 Dry eye syndrome of bilateral lacrimal glands: Secondary | ICD-10-CM | POA: Diagnosis not present

## 2017-11-04 DIAGNOSIS — H10413 Chronic giant papillary conjunctivitis, bilateral: Secondary | ICD-10-CM | POA: Diagnosis not present

## 2017-11-04 DIAGNOSIS — H26491 Other secondary cataract, right eye: Secondary | ICD-10-CM | POA: Diagnosis not present

## 2017-11-04 DIAGNOSIS — H532 Diplopia: Secondary | ICD-10-CM | POA: Diagnosis not present

## 2017-11-07 ENCOUNTER — Emergency Department (HOSPITAL_COMMUNITY): Payer: Medicare Other

## 2017-11-07 ENCOUNTER — Encounter (HOSPITAL_COMMUNITY): Payer: Self-pay

## 2017-11-07 ENCOUNTER — Other Ambulatory Visit: Payer: Self-pay

## 2017-11-07 ENCOUNTER — Inpatient Hospital Stay (HOSPITAL_COMMUNITY)
Admission: EM | Admit: 2017-11-07 | Discharge: 2017-11-10 | DRG: 195 | Disposition: A | Discharge status: Home / Self Care | Payer: Medicare Other | Attending: Internal Medicine | Admitting: Internal Medicine

## 2017-11-07 DIAGNOSIS — E785 Hyperlipidemia, unspecified: Secondary | ICD-10-CM | POA: Diagnosis present

## 2017-11-07 DIAGNOSIS — Z79899 Other long term (current) drug therapy: Secondary | ICD-10-CM | POA: Diagnosis not present

## 2017-11-07 DIAGNOSIS — I10 Essential (primary) hypertension: Secondary | ICD-10-CM | POA: Diagnosis present

## 2017-11-07 DIAGNOSIS — Z7982 Long term (current) use of aspirin: Secondary | ICD-10-CM

## 2017-11-07 DIAGNOSIS — E119 Type 2 diabetes mellitus without complications: Secondary | ICD-10-CM | POA: Diagnosis not present

## 2017-11-07 DIAGNOSIS — J189 Pneumonia, unspecified organism: Secondary | ICD-10-CM | POA: Diagnosis not present

## 2017-11-07 DIAGNOSIS — D649 Anemia, unspecified: Secondary | ICD-10-CM | POA: Diagnosis present

## 2017-11-07 DIAGNOSIS — Z8673 Personal history of transient ischemic attack (TIA), and cerebral infarction without residual deficits: Secondary | ICD-10-CM | POA: Diagnosis not present

## 2017-11-07 DIAGNOSIS — J154 Pneumonia due to other streptococci: Principal | ICD-10-CM | POA: Diagnosis present

## 2017-11-07 DIAGNOSIS — I700 Atherosclerosis of aorta: Secondary | ICD-10-CM | POA: Diagnosis not present

## 2017-11-07 DIAGNOSIS — E876 Hypokalemia: Secondary | ICD-10-CM | POA: Diagnosis present

## 2017-11-07 DIAGNOSIS — J181 Lobar pneumonia, unspecified organism: Secondary | ICD-10-CM | POA: Diagnosis not present

## 2017-11-07 DIAGNOSIS — R0600 Dyspnea, unspecified: Secondary | ICD-10-CM | POA: Diagnosis not present

## 2017-11-07 DIAGNOSIS — R05 Cough: Secondary | ICD-10-CM | POA: Diagnosis not present

## 2017-11-07 LAB — I-STAT CG4 LACTIC ACID, ED
LACTIC ACID, VENOUS: 1.15 mmol/L (ref 0.5–1.9)
Lactic Acid, Venous: 1.22 mmol/L (ref 0.5–1.9)

## 2017-11-07 LAB — COMPREHENSIVE METABOLIC PANEL
ALK PHOS: 78 U/L (ref 38–126)
ALT: 13 U/L — ABNORMAL LOW (ref 14–54)
ANION GAP: 9 (ref 5–15)
AST: 19 U/L (ref 15–41)
Albumin: 3.6 g/dL (ref 3.5–5.0)
BILIRUBIN TOTAL: 0.8 mg/dL (ref 0.3–1.2)
BUN: 17 mg/dL (ref 6–20)
CALCIUM: 8.7 mg/dL — AB (ref 8.9–10.3)
CO2: 26 mmol/L (ref 22–32)
Chloride: 107 mmol/L (ref 101–111)
Creatinine, Ser: 0.99 mg/dL (ref 0.44–1.00)
GFR calc non Af Amer: 52 mL/min — ABNORMAL LOW (ref >60)
Glucose, Bld: 103 mg/dL — ABNORMAL HIGH (ref 65–99)
Potassium: 3.3 mmol/L — ABNORMAL LOW (ref 3.5–5.1)
Sodium: 142 mmol/L (ref 135–145)
TOTAL PROTEIN: 6.8 g/dL (ref 6.5–8.1)

## 2017-11-07 LAB — URINALYSIS, ROUTINE W REFLEX MICROSCOPIC
Bacteria, UA: NONE SEEN
Bilirubin Urine: NEGATIVE
Glucose, UA: NEGATIVE mg/dL
Hgb urine dipstick: NEGATIVE
Ketones, ur: NEGATIVE mg/dL
Nitrite: NEGATIVE
Protein, ur: 30 mg/dL — AB
Specific Gravity, Urine: 1.021 (ref 1.005–1.030)
pH: 7 (ref 5.0–8.0)

## 2017-11-07 LAB — CBC
HEMATOCRIT: 37.4 % (ref 36.0–46.0)
Hemoglobin: 12.5 g/dL (ref 12.0–15.0)
MCH: 30.6 pg (ref 26.0–34.0)
MCHC: 33.4 g/dL (ref 30.0–36.0)
MCV: 91.7 fL (ref 78.0–100.0)
PLATELETS: 365 10*3/uL (ref 150–400)
RBC: 4.08 MIL/uL (ref 3.87–5.11)
RDW: 14.3 % (ref 11.5–15.5)
WBC: 20.1 10*3/uL — AB (ref 4.0–10.5)

## 2017-11-07 LAB — I-STAT TROPONIN, ED: Troponin i, poc: 0 ng/mL (ref 0.00–0.08)

## 2017-11-07 LAB — I-STAT CHEM 8, ED
BUN: 18 mg/dL (ref 6–20)
Calcium, Ion: 1.11 mmol/L — ABNORMAL LOW (ref 1.15–1.40)
Chloride: 104 mmol/L (ref 101–111)
Creatinine, Ser: 1 mg/dL (ref 0.44–1.00)
Glucose, Bld: 92 mg/dL (ref 65–99)
HCT: 37 % (ref 36.0–46.0)
Hemoglobin: 12.6 g/dL (ref 12.0–15.0)
Potassium: 3.2 mmol/L — ABNORMAL LOW (ref 3.5–5.1)
Sodium: 142 mmol/L (ref 135–145)
TCO2: 27 mmol/L (ref 22–32)

## 2017-11-07 LAB — LIPASE, BLOOD: Lipase: 17 U/L (ref 11–51)

## 2017-11-07 MED ORDER — ROSUVASTATIN CALCIUM 10 MG PO TABS
10.0000 mg | ORAL_TABLET | Freq: Every day | ORAL | Status: DC
  Administered 2017-11-08: 10 mg via ORAL
  Filled 2017-11-07: qty 1

## 2017-11-07 MED ORDER — ONDANSETRON HCL 4 MG/2ML IJ SOLN
4.0000 mg | Freq: Once | INTRAMUSCULAR | Status: AC
  Administered 2017-11-07: 4 mg via INTRAVENOUS
  Filled 2017-11-07: qty 2

## 2017-11-07 MED ORDER — SODIUM CHLORIDE 0.9 % IV SOLN
INTRAVENOUS | Status: DC
  Administered 2017-11-07 – 2017-11-09 (×4): via INTRAVENOUS

## 2017-11-07 MED ORDER — POTASSIUM CHLORIDE CRYS ER 20 MEQ PO TBCR: 40.0000 meq | EXTENDED_RELEASE_TABLET | Freq: Once | ORAL | Status: DC

## 2017-11-07 MED ORDER — IOPAMIDOL (ISOVUE-370) INJECTION 76%
100.0000 mL | Freq: Once | INTRAVENOUS | Status: AC | PRN
  Administered 2017-11-07: 100 mL via INTRAVENOUS

## 2017-11-07 MED ORDER — AZITHROMYCIN 250 MG PO TABS
500.0000 mg | ORAL_TABLET | Freq: Every day | ORAL | Status: DC
  Administered 2017-11-08 (×2): 500 mg via ORAL
  Filled 2017-11-07 (×2): qty 2

## 2017-11-07 MED ORDER — IOPAMIDOL (ISOVUE-370) INJECTION 76%
INTRAVENOUS | Status: AC
  Filled 2017-11-07: qty 100

## 2017-11-07 MED ORDER — MAGNESIUM OXIDE 400 (241.3 MG) MG PO TABS
400.0000 mg | ORAL_TABLET | Freq: Once | ORAL | Status: AC
  Administered 2017-11-07: 400 mg via ORAL
  Filled 2017-11-07: qty 1

## 2017-11-07 MED ORDER — SODIUM CHLORIDE 0.9 % IV SOLN
1.0000 g | Freq: Once | INTRAVENOUS | Status: AC
  Administered 2017-11-07: 1 g via INTRAVENOUS
  Filled 2017-11-07: qty 10

## 2017-11-07 MED ORDER — POTASSIUM CHLORIDE CRYS ER 20 MEQ PO TBCR
40.0000 meq | EXTENDED_RELEASE_TABLET | Freq: Once | ORAL | Status: AC
  Administered 2017-11-07: 40 meq via ORAL
  Filled 2017-11-07: qty 2

## 2017-11-07 MED ORDER — MAGNESIUM OXIDE 400 (241.3 MG) MG PO TABS: 400.0000 mg | ORAL_TABLET | Freq: Once | ORAL | Status: DC

## 2017-11-07 MED ORDER — SODIUM CHLORIDE 0.9 % IV SOLN
1.0000 g | INTRAVENOUS | Status: DC
  Administered 2017-11-08: 1 g via INTRAVENOUS
  Filled 2017-11-07: qty 1

## 2017-11-07 MED ORDER — SODIUM CHLORIDE 0.9 % IJ SOLN
INTRAMUSCULAR | Status: AC
  Filled 2017-11-07: qty 50

## 2017-11-07 MED ORDER — ASPIRIN EC 81 MG PO TBEC
81.0000 mg | DELAYED_RELEASE_TABLET | Freq: Every day | ORAL | Status: DC
  Administered 2017-11-08 – 2017-11-10 (×3): 81 mg via ORAL
  Filled 2017-11-07 (×3): qty 1

## 2017-11-07 MED ORDER — VITAMIN B-12 1000 MCG PO TABS
1000.0000 ug | ORAL_TABLET | Freq: Every day | ORAL | Status: DC
  Administered 2017-11-08 – 2017-11-10 (×3): 1000 ug via ORAL
  Filled 2017-11-07 (×3): qty 1

## 2017-11-07 MED ORDER — ENOXAPARIN SODIUM 40 MG/0.4ML ~~LOC~~ SOLN
40.0000 mg | Freq: Every day | SUBCUTANEOUS | Status: DC
  Administered 2017-11-08 – 2017-11-09 (×3): 40 mg via SUBCUTANEOUS
  Filled 2017-11-07 (×3): qty 0.4

## 2017-11-07 MED ORDER — AMLODIPINE BESYLATE 5 MG PO TABS
5.0000 mg | ORAL_TABLET | Freq: Every day | ORAL | Status: DC
  Administered 2017-11-08 – 2017-11-10 (×3): 5 mg via ORAL
  Filled 2017-11-07 (×3): qty 1

## 2017-11-07 MED ORDER — ACETAMINOPHEN 325 MG PO TABS
650.0000 mg | ORAL_TABLET | Freq: Four times a day (QID) | ORAL | Status: DC | PRN
  Administered 2017-11-08 – 2017-11-10 (×8): 650 mg via ORAL
  Filled 2017-11-07 (×8): qty 2

## 2017-11-07 MED ORDER — TRAMADOL HCL 50 MG PO TABS: 50.0000 mg | ORAL_TABLET | Freq: Four times a day (QID) | ORAL | Status: DC | PRN

## 2017-11-07 MED ORDER — IRBESARTAN 150 MG PO TABS
150.0000 mg | ORAL_TABLET | Freq: Every day | ORAL | Status: DC
  Administered 2017-11-08 – 2017-11-10 (×3): 150 mg via ORAL
  Filled 2017-11-07 (×3): qty 1

## 2017-11-07 MED ORDER — PANTOPRAZOLE SODIUM 40 MG PO TBEC
80.0000 mg | DELAYED_RELEASE_TABLET | Freq: Every day | ORAL | Status: DC
Start: 2017-11-08 — End: 2017-11-10
  Administered 2017-11-08 – 2017-11-10 (×3): 80 mg via ORAL
  Filled 2017-11-07 (×4): qty 2

## 2017-11-07 NOTE — ED Provider Notes (Signed)
[facility] DEPT Provider Note   CSN: [id number] Arrival date & time: 11/07/17  1828     History   Chief Complaint Chief Complaint  Patient presents with  . Chills  . Emesis  . Cough    HPI  Blood pressure 128/78, pulse 100, temperature 97.8 F (36.6 C), temperature source Oral, resp. rate 20, height 5' 5.5" (1.664 m), weight 62.6 kg (138 lb), SpO2 97 %.  Anna Hoffman is a 83 y.o. female with past medical history significant for hypertension hyperlipidemia complaining of chills, intermittently productive cough with fatigue onset yesterday after patient went out to the grocery store in the cold rain.  She denies sick contacts.  She states that she had multiple episodes of nonbloody, nonbilious emesis with no associated diarrhea, change in urination.  She states that she did have some chest and arm pain yesterday however this is resolved.  She denies any history of DVT or PE, she denies any recent immobilizations, lower extremity edema, calf pain or leg swelling.  Past Medical History:  Diagnosis Date  . Diabetes mellitus without complication ([place])   . Hypertension   . Hypokalemia   . Stroke [facility])     Patient Active Problem List   Diagnosis Date Noted  . Hypertensive emergency 07/01/2016  . Thalamic hemorrhage ([place]) 07/01/2016  . 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 Benign essential HTN   . Diabetes mellitus type 2 in nonobese ([name])   . Mixed hyperlipidemia   . ICH (intracerebral hemorrhage) ([place]) - hypertensive R thalamic hemorrhage  06/27/2016  . 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 Retroperitoneal bleed 02/08/2016  . Abdominal pain   . Protein-calorie malnutrition, severe 02/05/2016  . Gastroenteritis 02/04/2016  . Diarrhea 02/04/2016  . Hypokalemia 02/04/2016  . Incarcerated paraesophageal hernia 09/02/2014  . Renal mass, left 09/02/2014  . Acute esophagitis 09/02/2014  . Gastric outlet obstruction 09/02/2014  .  Hypertension   . Vomiting 09/01/2014    Past Surgical History:  Procedure Laterality Date  . ABDOMINAL HYSTERECTOMY    . BREAST EXCISIONAL BIOPSY Left   . ESOPHAGOGASTRODUODENOSCOPY N/A 09/01/2014   Procedure: ESOPHAGOGASTRODUODENOSCOPY (EGD);  Surgeon: Lear Ng, MD;  Location: [facility];  Service: Endoscopy;  Laterality: N/A;  . HIATAL HERNIA REPAIR N/A 09/04/2014   Procedure: LAPAROSCOPIC REPAIR OF HIATAL HERNIA;  Surgeon: Excell Seltzer, MD;  Location: [facility];  Service: General;  Laterality: N/A;  With MESH  . IR GENERIC HISTORICAL  04/10/2016   IR US GUIDE VASC ACCESS RIGHT 04/10/2016 Corrie Mckusick, DO [facility]  . IR GENERIC HISTORICAL  04/10/2016   IR ANGIOGRAM SELECTIVE EACH ADDITIONAL VESSEL 04/10/2016 Corrie Mckusick, DO [facility]  . IR GENERIC HISTORICAL  04/10/2016   IR ANGIOGRAM SELECTIVE EACH ADDITIONAL VESSEL 04/10/2016 Corrie Mckusick, DO [facility]  . IR GENERIC HISTORICAL  04/10/2016   IR EMBO TUMOR ORGAN ISCHEMIA INFARCT INC GUIDE ROADMAPPING 04/10/2016 Corrie Mckusick, DO [facility]  . IR GENERIC HISTORICAL  04/10/2016   IR ANGIOGRAM SELECTIVE EACH ADDITIONAL VESSEL 04/10/2016 Corrie Mckusick, DO [facility]  . IR GENERIC HISTORICAL  04/10/2016   IR ANGIOGRAM SELECTIVE EACH ADDITIONAL VESSEL 04/10/2016 Corrie Mckusick, DO [facility]  . IR GENERIC HISTORICAL  04/10/2016   IR RENAL SELECTIVE  UNI INC S&I MOD SED 04/10/2016 Corrie Mckusick, DO [facility]  . IR GENERIC HISTORICAL  03/06/2016   IR RADIOLOGIST EVAL & MGMT 03/06/2016 Corrie Mckusick, DO [facility]  . IR GENERIC HISTORICAL  03/26/2016   IR RADIOLOGIST EVAL & MGMT 03/26/2016 Corrie Mckusick, DO [facility]  . IR GENERIC HISTORICAL  04/29/2016   IR RADIOLOGIST EVAL & MGMT 04/29/2016 [facility]  . IR RADIOLOGIST EVAL & MGMT  11/12/2016  . KNEE SURGERY       OB History   None      Home Medications    Prior to Admission medications   Medication Sig Start Date End Date Taking? Authorizing Provider    acetaminophen (TYLENOL) 325 MG tablet Take 2 tablets (650 mg total) by mouth every 6 (six) hours as needed for mild pain (or Fever >/= 101). 02/08/16   Johnson, Clanford L, MD  amLODipine (NORVASC) 5 MG tablet Take 1 tablet (5 mg total) by mouth daily. 07/11/16   Angiulli, Lavon Paganini, PA-C  Ascorbic Acid (VITAMIN C) 1000 MG tablet Take 1,000 mg by mouth daily.    [provider]  aspirin EC 81 MG tablet Take 81 mg by mouth daily.    [provider]  D3-50 50000 units capsule TAKE 1 CAPSULE BY MOUTH ONCE A WEEK FOR 6 WEEKS 01/14/17   [provider]  diclofenac sodium (VOLTAREN) 1 % GEL Apply 2 g topically 3 (three) times daily. 07/11/16   Angiulli, Lavon Paganini, PA-C  fexofenadine (ALLEGRA) 180 MG tablet Take 180 mg by mouth daily.    [provider]  irbesartan (AVAPRO) 150 MG tablet Take 1 tablet (150 mg total) by mouth daily. 07/11/16   Angiulli, Lavon Paganini, PA-C  Multiple Vitamins-Minerals (STRESS TAB NF PO) Take 1 tablet by mouth daily.    [provider]  omeprazole (PRILOSEC) 40 MG capsule Take 1 capsule (40 mg total) by mouth daily. 07/11/16   Angiulli, Lavon Paganini, PA-C  rosuvastatin (CRESTOR) 10 MG tablet Take 1 tablet (10 mg total) by mouth daily. 07/11/16   Angiulli, Lavon Paganini, PA-C  traMADol (ULTRAM) 50 MG tablet Take 50 mg by mouth every 6 (six) hours as needed. for pain 02/17/17   [provider]  vitamin B-12 (CYANOCOBALAMIN) 1000 MCG tablet Take 1,000 mcg by mouth daily.    [provider]    Family History Family History  Problem Relation Age of Onset  . Prostate cancer Father     Social History Social History   Tobacco Use  . Smoking status: Never Smoker  . Smokeless tobacco: Never Used  Substance Use Topics  . Alcohol use: No  . Drug use: No     Allergies   Amoxicillin; Ampicillin; and Sulfa antibiotics   Review of Systems Review of Systems  A complete review of systems was obtained and all systems are negative  except as noted in the HPI and PMH.   Physical Exam Updated Vital Signs BP 132/79 (BP Location: Right Arm)   Pulse 96   Temp 97.8 F (36.6 C) (Oral)   Resp 18   Ht 5' 5.5" (1.664 m)   Wt 62.6 kg (138 lb)   SpO2 97%   BMI 22.62 kg/m   Physical Exam  Constitutional: She is oriented to person, place, and time. She appears well-developed and well-nourished. No distress.  HENT:  Head: Normocephalic and atraumatic.  Right Ear: External ear normal.  Left Ear: External ear normal.  Mouth/Throat: Oropharynx is clear and moist. No oropharyngeal exudate.  No drooling or stridor. Posterior pharynx mildly erythematous no significant tonsillar hypertrophy. No exudate. Soft palate rises symmetrically. No TTP or induration under tongue.   No tenderness to palpation  of frontal or bilateral maxillary sinuses.  Mild mucosal edema in the nares with scant rhinorrhea.  Bilateral tympanic membranes with normal architecture and good light reflex.    Eyes: Pupils are equal, round, and reactive to light. Conjunctivae and EOM are normal.  Neck: Normal range of motion. Neck supple.  Cardiovascular: Normal rate, regular rhythm and intact distal pulses.  Pulmonary/Chest: Effort normal and breath sounds normal. No stridor. No respiratory distress. She has no wheezes. She has no rales. She exhibits no tenderness.  Abdominal: Soft. She exhibits no mass. There is no tenderness. There is no rebound and no guarding. No hernia.  Musculoskeletal: Normal range of motion.  Neurological: She is alert and oriented to person, place, and time.  Skin: She is not diaphoretic.  Psychiatric: She has a normal mood and affect.  Nursing note and vitals reviewed.    ED Treatments / Results  Labs (all labs ordered are listed, but only abnormal results are displayed) Labs Reviewed  CBC - Abnormal; Notable for the following components:      Result Value   WBC 20.1 (*)    All other components within normal limits    URINALYSIS, ROUTINE W REFLEX MICROSCOPIC - Abnormal; Notable for the following components:   Protein, ur 30 (*)    Leukocytes, UA TRACE (*)    Squamous Epithelial / LPF 0-5 (*)    All other components within normal limits  COMPREHENSIVE METABOLIC PANEL - Abnormal; Notable for the following components:   Potassium 3.3 (*)    Glucose, Bld 103 (*)    Calcium 8.7 (*)    ALT 13 (*)    GFR calc non Af Amer 52 (*)    All other components within normal limits  I-STAT CHEM 8, ED - Abnormal; Notable for the following components:   Potassium 3.2 (*)    Calcium, Ion 1.11 (*)    All other components within normal limits  CULTURE, BLOOD (ROUTINE X 2)  CULTURE, BLOOD (ROUTINE X 2)  LIPASE, BLOOD  I-STAT CG4 LACTIC ACID, ED  I-STAT TROPONIN, ED  I-STAT CG4 LACTIC ACID, ED    EKG None  Radiology Dg Chest 2 View  Result Date: 11/07/2017 CLINICAL DATA:  Nonproductive cough today EXAM: CHEST - 2 VIEW COMPARISON:  September 01, 2014 FINDINGS: The heart size and mediastinal contours are within normal limits. Masslike opacity is identified in the right upper lobe. There is no pleural effusion or pulmonary edema. The visualized skeletal structures are unremarkable. IMPRESSION: Masslike opacity in the right upper lobe. Further evaluation with chest CT is recommended Electronically Signed   By: Abelardo Diesel M.D.   On: 11/07/2017 19:18   Ct Angio Chest Pe W And/or Wo Contrast  Result Date: 11/07/2017 CLINICAL DATA:  Nonproductive cough, chills and body aches since 0300 hours today. Dyspnea. EXAM: CT ANGIOGRAPHY CHEST WITH CONTRAST TECHNIQUE: Multidetector CT imaging of the chest was performed using the standard protocol during bolus administration of intravenous contrast. Multiplanar CT image reconstructions and MIPs were obtained to evaluate the vascular anatomy. CONTRAST:  100 cc ISOVUE-370 IOPAMIDOL (ISOVUE-370) INJECTION 76% COMPARISON:  Same day CXR FINDINGS: Cardiovascular: Atherosclerosis at the takeoff  of the left subclavian artery from the aorta. Ectasia of the ascending thoracic aorta with mild atherosclerosis. No dissection. No acute pulmonary embolus to the segmental and subsegmental levels. Heart size is within normal limits. No pericardial effusion. Left main and three-vessel coronary arteriosclerosis is noted. Mediastinum/Nodes: Fluid-filled esophagus to the upper third question reflux. Moderate-sized hiatal  hernia. No mediastinal adenopathy. Mildly enlarged hilar lymph nodes on the right likely reactive. No thyromegaly or mass. Lungs/Pleura: Patchy airspace opacities in the right upper, middle and lower lobes consistent with multifocal pneumonia. As some of these pulmonary opacities are slightly stellate in appearance in the lower lobe in particular, follow-up after treating for pneumonia is recommended to ensure clearance in 4-6 weeks. No effusion or pneumothorax. Upper Abdomen: Unremarkable Musculoskeletal: No aggressive osseous lesions. Spondylosis of the included lower cervical and thoracic spine. Review of the MIP images confirms the above findings. IMPRESSION: 1. Multilobar pulmonary consolidations within the right lung consistent with pneumonia, more so in the right upper lobe. As there are subtle stellate appearing opacities in the right lower lobe, follow-up is recommended fin 4-6 weeks after a trial of antibiotics and to exclude neoplasm 2. Mild right hilar reactive adenopathy. 3. No acute pulmonary embolus. 4. Fluid-filled esophagus suspicious for reflux in the presence of a moderate-sized hiatal hernia. 5. Mild thoracic aortic arteriosclerosis. Left main and three-vessel coronary arteriosclerosis. Aortic Atherosclerosis (ICD10-I70.0). Electronically Signed   By: Ashley Royalty M.D.   On: 11/07/2017 22:13    Procedures Procedures (including critical care time)  Medications Ordered in ED Medications  0.9 %  sodium chloride infusion ( Intravenous New Bag/Given 11/07/17 2036)  cefTRIAXone  (ROCEPHIN) 1 g in sodium chloride 0.9 % 100 mL IVPB (has no administration in time range)  magnesium oxide (MAG-OX) tablet 400 mg (has no administration in time range)  potassium chloride SA (K-DUR,KLOR-CON) CR tablet 40 mEq (has no administration in time range)  ondansetron (ZOFRAN) injection 4 mg (4 mg Intravenous Given 11/07/17 2036)  iopamidol (ISOVUE-370) 76 % injection 100 mL (100 mLs Intravenous Contrast Given 11/07/17 2144)  magnesium oxide (MAG-OX) tablet 400 mg (400 mg Oral Given 11/07/17 2155)  potassium chloride SA (K-DUR,KLOR-CON) CR tablet 40 mEq (40 mEq Oral Given 11/07/17 2155)     Initial Impression / Assessment and Plan / ED Course  I have reviewed the triage vital signs and the nursing notes.  Pertinent labs & imaging results that were available during my care of the patient were reviewed by me and considered in my medical decision making (see chart for details).     Vitals:   11/07/17 1841 11/07/17 1849 11/07/17 2044  BP: 128/78  132/79  Pulse: 100  96  Resp: 20  18  Temp: 97.8 F (36.6 C)    TempSrc: Oral    SpO2: 97%  97%  Weight:  62.6 kg (138 lb)   Height:  5' 5.5" (1.664 m)     Medications  0.9 %  sodium chloride infusion ( Intravenous New Bag/Given 11/07/17 2036)  cefTRIAXone (ROCEPHIN) 1 g in sodium chloride 0.9 % 100 mL IVPB (has no administration in time range)  magnesium oxide (MAG-OX) tablet 400 mg (has no administration in time range)  potassium chloride SA (K-DUR,KLOR-CON) CR tablet 40 mEq (has no administration in time range)  ondansetron (ZOFRAN) injection 4 mg (4 mg Intravenous Given 11/07/17 2036)  iopamidol (ISOVUE-370) 76 % injection 100 mL (100 mLs Intravenous Contrast Given 11/07/17 2144)  magnesium oxide (MAG-OX) tablet 400 mg (400 mg Oral Given 11/07/17 2155)  potassium chloride SA (K-DUR,KLOR-CON) CR tablet 40 mEq (40 mEq Oral Given 11/07/17 2155)    Anna Hoffman is 83 y.o. female presenting with cough, chest pain, multiple episodes of emesis with  chills worsening over the course of the last day.  Vital signs reassuring, lung sounds clear.  Chest x-ray  with possible mass in the right upper lobe.  CT reveals a multifocal pneumonia, she is got a white count of 20, given her age and fragility will admit.  Cultures and Rocephin given.     Final Clinical Impressions(s) / ED Diagnoses   Final diagnoses:  Community acquired pneumonia of right lung, unspecified part of lung    ED Discharge Orders    None       Arel Tippen, Charna Elizabeth 11/07/17 2253    Lajean Saver, MD 11/07/17 2359    Lajean Saver, MD 11/08/17 0000

## 2017-11-07 NOTE — ED Notes (Signed)
ED TO INPATIENT HANDOFF REPORT  Name/Age/Gender Anna Hoffman 83 y.o. female  Code Status    Code Status Orders  (From admission, onward)        Start     Ordered   11/07/17 2254  Full code  Continuous     11/07/17 2254    Code Status History    Date Active Date Inactive Code Status Order ID Comments User Context   07/01/2016 1734 07/12/2016 1302 Full Code [id number]  Elizabeth Sauer Inpatient   07/01/2016 1734 07/01/2016 1734 Full Code [id number]  Cathlyn Parsons, PA-C Inpatient   06/27/2016 2000 07/01/2016 1724 Full Code [id number]  Greta Doom, MD Inpatient   02/04/2016 1800 02/08/2016 1449 Full Code [id number]  Jani Gravel, MD Inpatient   09/01/2014 0612 09/06/2014 1408 Full Code [id number]  Alphonsa Overall, MD ED      Home/SNF/Other Home  Chief Complaint Flu Like Symptoms   Level of Care/Admitting Diagnosis ED Disposition    ED Disposition Condition [place]: [facility] [[id number]]  Level of Care: Med-Surg [16]  Diagnosis: Community acquired pneumonia of right upper lobe of lung [facility]) [[id number]]  Admitting Physician: Etta Quill [phone]  Attending Physician: Etta Quill [[id number]  PT Class (Do Not Modify): Observation [104]  PT Acc Code (Do Not Modify): Observation [10022]       Medical History Past Medical History:  Diagnosis Date  . Diabetes mellitus without complication ([place])   . Hypertension   . Hypokalemia   . Stroke [facility])     Allergies Allergies  Allergen Reactions  . Amoxicillin Diarrhea    Has patient had a PCN reaction causing immediate rash, facial/tongue/throat swelling, SOB or lightheadedness with hypotension: no Has patient had a PCN reaction causing severe rash involving mucus membranes or skin necrosis: no Has patient had a PCN reaction that required hospitalization: no pharmacist consult Has patient had a PCN reaction occurring within the last 10 years: yes If all of the above  answers are "NO", then may proceed with Cephalosporin use.   . Ampicillin Rash    Has patient had a PCN reaction causing immediate rash, facial/tongue/throat swelling, SOB or lightheadedness with hypotension: No Has patient had a PCN reaction causing severe rash involving mucus membranes or skin necrosis: No Has patient had a PCN reaction that required hospitalization No Has patient had a PCN reaction occurring within the last 10 years: No If all of the above answers are "NO", then may proceed with Cephalosporin use.   . Sulfa Antibiotics Rash    IV Location/Drains/Wounds Patient Lines/Drains/Airways Status   Active Line/Drains/Airways    Name:   Placement date:   Placement time:   Site:   Days:   Peripheral IV 11/07/17 Right Forearm   11/07/17    2127    Forearm   less than 1   Incision (Closed) 09/04/14 Abdomen   09/04/14    1503     1160   Incision - 6 Ports Abdomen Right;Lateral Right;Medial Left;Medial Left;Medial;Lateral Left;Lateral Mid;Upper   09/04/14    1455     1160          Labs/Imaging Results for orders placed or performed during the hospital encounter of 11/07/17 (from the past 48 hour(s))  CBC     Status: Abnormal   Collection Time: 11/07/17  7:36 PM  Result Value Ref Range   WBC 20.1 (H) 4.0 - 10.5 K/uL   RBC 4.08  3.87 - 5.11 MIL/uL   Hemoglobin 12.5 12.0 - 15.0 g/dL   HCT 37.4 36.0 - 46.0 %   MCV 91.7 78.0 - 100.0 fL   MCH 30.6 26.0 - 34.0 pg   MCHC 33.4 30.0 - 36.0 g/dL   RDW 14.3 11.5 - 15.5 %   Platelets 365 150 - 400 K/uL    Comment: Performed at [facility], [place] [address] [place], [place] [address]  I-Stat CG4 Lactic Acid, ED     Status: None   Collection Time: 11/07/17  7:49 PM  Result Value Ref Range   Lactic Acid, Venous 1.22 0.5 - 1.9 mmol/L  Urinalysis, Routine w reflex microscopic     Status: Abnormal   Collection Time: 11/07/17  8:03 PM  Result Value Ref Range   Color, Urine YELLOW YELLOW   APPearance CLEAR CLEAR    Specific Gravity, Urine 1.021 1.005 - 1.030   pH 7.0 5.0 - 8.0   Glucose, UA NEGATIVE NEGATIVE mg/dL   Hgb urine dipstick NEGATIVE NEGATIVE   Bilirubin Urine NEGATIVE NEGATIVE   Ketones, ur NEGATIVE NEGATIVE mg/dL   Protein, ur 30 (A) NEGATIVE mg/dL   Nitrite NEGATIVE NEGATIVE   Leukocytes, UA TRACE (A) NEGATIVE   RBC / HPF 0-5 0 - 5 RBC/hpf   WBC, UA 0-5 0 - 5 WBC/hpf   Bacteria, UA NONE SEEN NONE SEEN   Squamous Epithelial / LPF 0-5 (A) NONE SEEN    Comment: Performed at [facility], [place] [address], [place], [place] [address]  Comprehensive metabolic panel     Status: Abnormal   Collection Time: 11/07/17  8:25 PM  Result Value Ref Range   Sodium 142 135 - 145 mmol/L   Potassium 3.3 (L) 3.5 - 5.1 mmol/L   Chloride 107 101 - 111 mmol/L   CO2 26 22 - 32 mmol/L   Glucose, Bld 103 (H) 65 - 99 mg/dL   BUN 17 6 - 20 mg/dL   Creatinine, Ser 0.99 0.44 - 1.00 mg/dL   Calcium 8.7 (L) 8.9 - 10.3 mg/dL   Total Protein 6.8 6.5 - 8.1 g/dL   Albumin 3.6 3.5 - 5.0 g/dL   AST 19 15 - 41 U/L   ALT 13 (L) 14 - 54 U/L   Alkaline Phosphatase 78 38 - 126 U/L   Total Bilirubin 0.8 0.3 - 1.2 mg/dL   GFR calc non Af Amer 52 (L) >60 mL/min   GFR calc Af Amer >60 >60 mL/min    Comment: (NOTE) The eGFR has been calculated using the CKD EPI equation. This calculation has not been validated in all clinical situations. eGFR's persistently <60 mL/min signify possible Chronic Kidney Disease.    Anion gap 9 5 - 15    Comment: Performed at [facility], [place] [address] [place], [place] [address]  Lipase, blood     Status: None   Collection Time: 11/07/17  8:25 PM  Result Value Ref Range   Lipase 17 11 - 51 U/L    Comment: Performed at [facility], [place] [address], [place], [place] [address]  I-stat troponin, ED     Status: None   Collection Time: 11/07/17  8:41 PM  Result Value Ref Range   Troponin i, poc 0.00 0.00 - 0.08 ng/mL   Comment 3             Comment: Due to the release kinetics of cTnI, a negative result within the first  hours of the onset of symptoms does not rule out myocardial infarction with certainty. If myocardial infarction is still suspected, repeat the test at appropriate intervals.   I-Stat Chem 8, ED     Status: Abnormal   Collection Time: 11/07/17  8:42 PM  Result Value Ref Range   Sodium 142 135 - 145 mmol/L   Potassium 3.2 (L) 3.5 - 5.1 mmol/L   Chloride 104 101 - 111 mmol/L   BUN 18 6 - 20 mg/dL   Creatinine, Ser 1.00 0.44 - 1.00 mg/dL   Glucose, Bld 92 65 - 99 mg/dL   Calcium, Ion 1.11 (L) 1.15 - 1.40 mmol/L   TCO2 27 22 - 32 mmol/L   Hemoglobin 12.6 12.0 - 15.0 g/dL   HCT 37.0 36.0 - 46.0 %  I-Stat CG4 Lactic Acid, ED     Status: None   Collection Time: 11/07/17  8:44 PM  Result Value Ref Range   Lactic Acid, Venous 1.15 0.5 - 1.9 mmol/L   Dg Chest 2 View  Result Date: 11/07/2017 CLINICAL DATA:  Nonproductive cough today EXAM: CHEST - 2 VIEW COMPARISON:  September 01, 2014 FINDINGS: The heart size and mediastinal contours are within normal limits. Masslike opacity is identified in the right upper lobe. There is no pleural effusion or pulmonary edema. The visualized skeletal structures are unremarkable. IMPRESSION: Masslike opacity in the right upper lobe. Further evaluation with chest CT is recommended Electronically Signed   By: Abelardo Diesel M.D.   On: 11/07/2017 19:18   Ct Angio Chest Pe W And/or Wo Contrast  Result Date: 11/07/2017 CLINICAL DATA:  Nonproductive cough, chills and body aches since 0300 hours today. Dyspnea. EXAM: CT ANGIOGRAPHY CHEST WITH CONTRAST TECHNIQUE: Multidetector CT imaging of the chest was performed using the standard protocol during bolus administration of intravenous contrast. Multiplanar CT image reconstructions and MIPs were obtained to evaluate the vascular anatomy. CONTRAST:  100 cc ISOVUE-370 IOPAMIDOL (ISOVUE-370) INJECTION 76% COMPARISON:  Same day CXR FINDINGS:  Cardiovascular: Atherosclerosis at the takeoff of the left subclavian artery from the aorta. Ectasia of the ascending thoracic aorta with mild atherosclerosis. No dissection. No acute pulmonary embolus to the segmental and subsegmental levels. Heart size is within normal limits. No pericardial effusion. Left main and three-vessel coronary arteriosclerosis is noted. Mediastinum/Nodes: Fluid-filled esophagus to the upper third question reflux. Moderate-sized hiatal hernia. No mediastinal adenopathy. Mildly enlarged hilar lymph nodes on the right likely reactive. No thyromegaly or mass. Lungs/Pleura: Patchy airspace opacities in the right upper, middle and lower lobes consistent with multifocal pneumonia. As some of these pulmonary opacities are slightly stellate in appearance in the lower lobe in particular, follow-up after treating for pneumonia is recommended to ensure clearance in 4-6 weeks. No effusion or pneumothorax. Upper Abdomen: Unremarkable Musculoskeletal: No aggressive osseous lesions. Spondylosis of the included lower cervical and thoracic spine. Review of the MIP images confirms the above findings. IMPRESSION: 1. Multilobar pulmonary consolidations within the right lung consistent with pneumonia, more so in the right upper lobe. As there are subtle stellate appearing opacities in the right lower lobe, follow-up is recommended fin 4-6 weeks after a trial of antibiotics and to exclude neoplasm 2. Mild right hilar reactive adenopathy. 3. No acute pulmonary embolus. 4. Fluid-filled esophagus suspicious for reflux in the presence of a moderate-sized hiatal hernia. 5. Mild thoracic aortic arteriosclerosis. Left main and three-vessel coronary arteriosclerosis. Aortic Atherosclerosis (ICD10-I70.0). Electronically Signed   By: Ashley Royalty M.D.   On: 11/07/2017 22:13  Pending Labs Unresulted Labs (From admission, onward)   Start     Ordered   11/07/17 2253  Culture, sputum-assessment  Once,   R      11/07/17 2254   11/07/17 2253  Gram stain  Once,   R     11/07/17 2254   11/07/17 2253  HIV antibody (Routine Screening)  Once,   R     11/07/17 2254   11/07/17 2253  Strep pneumoniae urinary antigen  Once,   R     11/07/17 2254   11/07/17 2226  Blood culture (routine x 2)  BLOOD CULTURE X 2,   STAT     11/07/17 2225      Vitals/Pain Today's Vitals   11/07/17 1849 11/07/17 1946 11/07/17 2044 11/07/17 2200  BP:   132/79 (!) 152/80  Pulse:   96 (!) 103  Resp:   18 (!) 22  Temp:      TempSrc:      SpO2:   97% 94%  Weight: 138 lb (62.6 kg)     Height: 5' 5.5" (1.664 m)     PainSc:  3       Isolation Precautions No active isolations  Medications Medications  0.9 %  sodium chloride infusion ( Intravenous New Bag/Given 11/07/17 2036)  cefTRIAXone (ROCEPHIN) 1 g in sodium chloride 0.9 % 100 mL IVPB (1 g Intravenous New Bag/Given 11/07/17 2257)  cefTRIAXone (ROCEPHIN) 1 g in sodium chloride 0.9 % 100 mL IVPB (has no administration in time range)  azithromycin (ZITHROMAX) tablet 500 mg (has no administration in time range)  enoxaparin (LOVENOX) injection 40 mg (has no administration in time range)  amLODipine (NORVASC) tablet 5 mg (has no administration in time range)  acetaminophen (TYLENOL) tablet 650 mg (has no administration in time range)  aspirin EC tablet 81 mg (has no administration in time range)  irbesartan (AVAPRO) tablet 150 mg (has no administration in time range)  rosuvastatin (CRESTOR) tablet 10 mg (has no administration in time range)  pantoprazole (PROTONIX) EC tablet 80 mg (has no administration in time range)  vitamin B-12 (CYANOCOBALAMIN) tablet 1,000 mcg (has no administration in time range)  traMADol (ULTRAM) tablet 50 mg (has no administration in time range)  ondansetron (ZOFRAN) injection 4 mg (4 mg Intravenous Given 11/07/17 2036)  iopamidol (ISOVUE-370) 76 % injection 100 mL (100 mLs Intravenous Contrast Given 11/07/17 2144)  magnesium oxide (MAG-OX) tablet 400  mg (400 mg Oral Given 11/07/17 2155)  potassium chloride SA (K-DUR,KLOR-CON) CR tablet 40 mEq (40 mEq Oral Given 11/07/17 2155)    Mobility walks

## 2017-11-07 NOTE — ED Triage Notes (Signed)
Patient c/o a non productive cough,,chills, body aches since 0300 today.

## 2017-11-07 NOTE — ED Notes (Signed)
RN currently starting IV and obtaining labs, pt agreed to attempt to give urine sample once RN was finished at bedside.

## 2017-11-07 NOTE — H&P (Signed)
History and Physical    Anna Hoffman VQX:[id number] DOB: [birth year] DOA: 11/07/2017  PCP: Glendale Chard, MD  Patient coming from: Home  I have personally briefly reviewed patient's old medical records in [place]  Chief Complaint: cough, chills  HPI: Anna Hoffman is a 83 y.o. female with medical history significant of HTN, HLD, ICH.  Patient presents to the ED with c/o chills, intermittent productive cough, fatigue.  Symptoms onset yesterday after going to grocery store in cold rain.  No sick contacts. Multiple episodes of NBNB emesis.  No diarrhea.   ED Course: RUL PNA on CT scan, WBC 20k.  Started on rocephin / azithro   Review of Systems: As per HPI otherwise 10 point review of systems negative.   Past Medical History:  Diagnosis Date  . Diabetes mellitus without complication ([place])   . Hypertension   . Hypokalemia   . Stroke [facility])     Past Surgical History:  Procedure Laterality Date  . ABDOMINAL HYSTERECTOMY    . BREAST EXCISIONAL BIOPSY Left   . ESOPHAGOGASTRODUODENOSCOPY N/A 09/01/2014   Procedure: ESOPHAGOGASTRODUODENOSCOPY (EGD);  Surgeon: Lear Ng, MD;  Location: [facility];  Service: Endoscopy;  Laterality: N/A;  . HIATAL HERNIA REPAIR N/A 09/04/2014   Procedure: LAPAROSCOPIC REPAIR OF HIATAL HERNIA;  Surgeon: Excell Seltzer, MD;  Location: [facility];  Service: General;  Laterality: N/A;  With MESH  . IR GENERIC HISTORICAL  04/10/2016   IR US GUIDE VASC ACCESS RIGHT 04/10/2016 Corrie Mckusick, DO [facility]  . IR GENERIC HISTORICAL  04/10/2016   IR ANGIOGRAM SELECTIVE EACH ADDITIONAL VESSEL 04/10/2016 Corrie Mckusick, DO [facility]  . IR GENERIC HISTORICAL  04/10/2016   IR ANGIOGRAM SELECTIVE EACH ADDITIONAL VESSEL 04/10/2016 Corrie Mckusick, DO [facility]  . IR GENERIC HISTORICAL  04/10/2016   IR EMBO TUMOR ORGAN ISCHEMIA INFARCT INC GUIDE ROADMAPPING 04/10/2016 Corrie Mckusick, DO [facility]  . IR GENERIC HISTORICAL  04/10/2016   IR ANGIOGRAM SELECTIVE EACH  ADDITIONAL VESSEL 04/10/2016 Corrie Mckusick, DO [facility]  . IR GENERIC HISTORICAL  04/10/2016   IR ANGIOGRAM SELECTIVE EACH ADDITIONAL VESSEL 04/10/2016 Corrie Mckusick, DO [facility]  . IR GENERIC HISTORICAL  04/10/2016   IR RENAL SELECTIVE  UNI INC S&I MOD SED 04/10/2016 Corrie Mckusick, DO [facility]  . IR GENERIC HISTORICAL  03/06/2016   IR RADIOLOGIST EVAL & MGMT 03/06/2016 Corrie Mckusick, DO [facility]  . IR GENERIC HISTORICAL  03/26/2016   IR RADIOLOGIST EVAL & MGMT 03/26/2016 Corrie Mckusick, DO [facility]  . IR GENERIC HISTORICAL  04/29/2016   IR RADIOLOGIST EVAL & MGMT 04/29/2016 [facility]  . IR RADIOLOGIST EVAL & MGMT  11/12/2016  . KNEE SURGERY       reports that she has never smoked. She has never used smokeless tobacco. She reports that she does not drink alcohol or use drugs.  Allergies  Allergen Reactions  . Amoxicillin Diarrhea    Has patient had a PCN reaction causing immediate rash, facial/tongue/throat swelling, SOB or lightheadedness with hypotension: no Has patient had a PCN reaction causing severe rash involving mucus membranes or skin necrosis: no Has patient had a PCN reaction that required hospitalization: no pharmacist consult Has patient had a PCN reaction occurring within the last 10 years: yes If all of the above answers are "NO", then may proceed with Cephalosporin use.   . Ampicillin Rash    Has patient had a PCN reaction causing immediate rash, facial/tongue/throat swelling,  SOB or lightheadedness with hypotension: No Has patient had a PCN reaction causing severe rash involving mucus membranes or skin necrosis: No Has patient had a PCN reaction that required hospitalization No Has patient had a PCN reaction occurring within the last 10 years: No If all of the above answers are "NO", then may proceed with Cephalosporin use.   . Sulfa Antibiotics Rash    Family History  Problem Relation Age of Onset  . Prostate cancer Father      Prior to  Admission medications   Medication Sig Start Date End Date Taking? Authorizing Provider  acetaminophen (TYLENOL) 325 MG tablet Take 2 tablets (650 mg total) by mouth every 6 (six) hours as needed for mild pain (or Fever >/= 101). 02/08/16   Johnson, Clanford L, MD  amLODipine (NORVASC) 5 MG tablet Take 1 tablet (5 mg total) by mouth daily. 07/11/16   Angiulli, Lavon Paganini, PA-C  Ascorbic Acid (VITAMIN C) 1000 MG tablet Take 1,000 mg by mouth daily.    [provider]  aspirin EC 81 MG tablet Take 81 mg by mouth daily.    [provider]  D3-50 50000 units capsule TAKE 1 CAPSULE BY MOUTH ONCE A WEEK FOR 6 WEEKS 01/14/17   [provider]  diclofenac sodium (VOLTAREN) 1 % GEL Apply 2 g topically 3 (three) times daily. 07/11/16   Angiulli, Lavon Paganini, PA-C  fexofenadine (ALLEGRA) 180 MG tablet Take 180 mg by mouth daily.    [provider]  irbesartan (AVAPRO) 150 MG tablet Take 1 tablet (150 mg total) by mouth daily. 07/11/16   Angiulli, Lavon Paganini, PA-C  Multiple Vitamins-Minerals (STRESS TAB NF PO) Take 1 tablet by mouth daily.    [provider]  omeprazole (PRILOSEC) 40 MG capsule Take 1 capsule (40 mg total) by mouth daily. 07/11/16   Angiulli, Lavon Paganini, PA-C  rosuvastatin (CRESTOR) 10 MG tablet Take 1 tablet (10 mg total) by mouth daily. 07/11/16   Angiulli, Lavon Paganini, PA-C  traMADol (ULTRAM) 50 MG tablet Take 50 mg by mouth every 6 (six) hours as needed. for pain 02/17/17   [provider]  vitamin B-12 (CYANOCOBALAMIN) 1000 MCG tablet Take 1,000 mcg by mouth daily.    [provider]    Physical Exam: Vitals:   11/07/17 1841 11/07/17 1849 11/07/17 2044 11/07/17 2200  BP: 128/78  132/79 (!) 152/80  Pulse: 100  96 (!) 103  Resp: 20  18 (!) 22  Temp: 97.8 F (36.6 C)     TempSrc: Oral     SpO2: 97%  97% 94%  Weight:  62.6 kg (138 lb)    Height:  5' 5.5" (1.664 m)      Constitutional: NAD, calm, comfortable Eyes: PERRL, lids and  conjunctivae normal ENMT: Mucous membranes are moist. Posterior pharynx clear of any exudate or lesions.Normal dentition.  Neck: normal, supple, no masses, no thyromegaly Respiratory: Few crackle RUL Cardiovascular: Regular rate and rhythm, no murmurs / rubs / gallops. No extremity edema. 2+ pedal pulses. No carotid bruits.  Abdomen: no tenderness, no masses palpated. No hepatosplenomegaly. Bowel sounds positive.  Musculoskeletal: no clubbing / cyanosis. No joint deformity upper and lower extremities. Good ROM, no contractures. Normal muscle tone.  Skin: no rashes, lesions, ulcers. No induration Neurologic: CN 2-12 grossly intact. Sensation intact, DTR normal. Strength 5/5 in all 4.  Psychiatric: Normal judgment and insight. Alert and oriented x 3. Normal mood.    Labs on Admission: I have personally reviewed  following labs and imaging studies  CBC: Recent Labs  Lab 11/07/17 1936 11/07/17 2042  WBC 20.1*  --   HGB 12.5 12.6  HCT 37.4 37.0  MCV 91.7  --   PLT 365  --    Basic Metabolic Panel: Recent Labs  Lab 11/07/17 2025 11/07/17 2042  NA 142 142  K 3.3* 3.2*  CL 107 104  CO2 26  --   GLUCOSE 103* 92  BUN 17 18  CREATININE 0.99 1.00  CALCIUM 8.7*  --    GFR: Estimated Creatinine Clearance: 41.2 mL/min (by C-G formula based on SCr of 1 mg/dL). Liver Function Tests: Recent Labs  Lab 11/07/17 2025  AST 19  ALT 13*  ALKPHOS 78  BILITOT 0.8  PROT 6.8  ALBUMIN 3.6   Recent Labs  Lab 11/07/17 2025  LIPASE 17   No results for input(s): AMMONIA in the last 168 hours. Coagulation Profile: No results for input(s): INR, PROTIME in the last 168 hours. Cardiac Enzymes: No results for input(s): CKTOTAL, CKMB, CKMBINDEX, TROPONINI in the last 168 hours. BNP (last 3 results) No results for input(s): PROBNP in the last 8760 hours. HbA1C: No results for input(s): HGBA1C in the last 72 hours. CBG: No results for input(s): GLUCAP in the last 168 hours. Lipid  Profile: No results for input(s): CHOL, HDL, LDLCALC, TRIG, CHOLHDL, LDLDIRECT in the last 72 hours. Thyroid Function Tests: No results for input(s): TSH, T4TOTAL, FREET4, T3FREE, THYROIDAB in the last 72 hours. Anemia Panel: No results for input(s): VITAMINB12, FOLATE, FERRITIN, TIBC, IRON, RETICCTPCT in the last 72 hours. Urine analysis:    Component Value Date/Time   COLORURINE YELLOW 11/07/2017 2003   APPEARANCEUR CLEAR 11/07/2017 2003   LABSPEC 1.021 11/07/2017 2003   PHURINE 7.0 11/07/2017 2003   GLUCOSEU NEGATIVE 11/07/2017 2003   HGBUR NEGATIVE 11/07/2017 2003   [place] NEGATIVE 11/07/2017 2003   [place] NEGATIVE 11/07/2017 2003   PROTEINUR 30 (A) 11/07/2017 2003   UROBILINOGEN 0.2 09/01/2014 0300   NITRITE NEGATIVE 11/07/2017 2003   LEUKOCYTESUR TRACE (A) 11/07/2017 2003    Radiological Exams on Admission: Dg Chest 2 View  Result Date: 11/07/2017 CLINICAL DATA:  Nonproductive cough today EXAM: CHEST - 2 VIEW COMPARISON:  September 01, 2014 FINDINGS: The heart size and mediastinal contours are within normal limits. Masslike opacity is identified in the right upper lobe. There is no pleural effusion or pulmonary edema. The visualized skeletal structures are unremarkable. IMPRESSION: Masslike opacity in the right upper lobe. Further evaluation with chest CT is recommended Electronically Signed   By: Abelardo Diesel M.D.   On: 11/07/2017 19:18   Ct Angio Chest Pe W And/or Wo Contrast  Result Date: 11/07/2017 CLINICAL DATA:  Nonproductive cough, chills and body aches since 0300 hours today. Dyspnea. EXAM: CT ANGIOGRAPHY CHEST WITH CONTRAST TECHNIQUE: Multidetector CT imaging of the chest was performed using the standard protocol during bolus administration of intravenous contrast. Multiplanar CT image reconstructions and MIPs were obtained to evaluate the vascular anatomy. CONTRAST:  100 cc ISOVUE-370 IOPAMIDOL (ISOVUE-370) INJECTION 76% COMPARISON:  Same day CXR FINDINGS:  Cardiovascular: Atherosclerosis at the takeoff of the left subclavian artery from the aorta. Ectasia of the ascending thoracic aorta with mild atherosclerosis. No dissection. No acute pulmonary embolus to the segmental and subsegmental levels. Heart size is within normal limits. No pericardial effusion. Left main and three-vessel coronary arteriosclerosis is noted. Mediastinum/Nodes: Fluid-filled esophagus to the upper third question reflux. Moderate-sized hiatal hernia. No mediastinal adenopathy. Mildly enlarged hilar  lymph nodes on the right likely reactive. No thyromegaly or mass. Lungs/Pleura: Patchy airspace opacities in the right upper, middle and lower lobes consistent with multifocal pneumonia. As some of these pulmonary opacities are slightly stellate in appearance in the lower lobe in particular, follow-up after treating for pneumonia is recommended to ensure clearance in 4-6 weeks. No effusion or pneumothorax. Upper Abdomen: Unremarkable Musculoskeletal: No aggressive osseous lesions. Spondylosis of the included lower cervical and thoracic spine. Review of the MIP images confirms the above findings. IMPRESSION: 1. Multilobar pulmonary consolidations within the right lung consistent with pneumonia, more so in the right upper lobe. As there are subtle stellate appearing opacities in the right lower lobe, follow-up is recommended fin 4-6 weeks after a trial of antibiotics and to exclude neoplasm 2. Mild right hilar reactive adenopathy. 3. No acute pulmonary embolus. 4. Fluid-filled esophagus suspicious for reflux in the presence of a moderate-sized hiatal hernia. 5. Mild thoracic aortic arteriosclerosis. Left main and three-vessel coronary arteriosclerosis. Aortic Atherosclerosis (ICD10-I70.0). Electronically Signed   By: Ashley Royalty M.D.   On: 11/07/2017 22:13    EKG: Independently reviewed.  Assessment/Plan Principal Problem:   Community acquired pneumonia of right upper lobe of lung ([place]) Active  Problems:   Benign essential HTN    1. CAP of RUL - 1. PNA pathway 2. Rocephin / azithro 3. Cultures pending 2. HTN - Continue home BP meds 3. HLD - continue statin  DVT prophylaxis: Lovenox Code Status: Full Family Communication: No family in room Disposition Plan: Home after admit Consults called: None Admission status: Place in obs   [place] [place] [facility] Pager [phone]  If 7AM-7PM, please contact day team taking care of patient [email or web address] Password [email or web address]  11/07/2017, 11:08 PM

## 2017-11-08 DIAGNOSIS — E119 Type 2 diabetes mellitus without complications: Secondary | ICD-10-CM | POA: Diagnosis present

## 2017-11-08 DIAGNOSIS — J189 Pneumonia, unspecified organism: Secondary | ICD-10-CM | POA: Diagnosis present

## 2017-11-08 DIAGNOSIS — D649 Anemia, unspecified: Secondary | ICD-10-CM | POA: Diagnosis not present

## 2017-11-08 DIAGNOSIS — J181 Lobar pneumonia, unspecified organism: Secondary | ICD-10-CM

## 2017-11-08 DIAGNOSIS — E876 Hypokalemia: Secondary | ICD-10-CM | POA: Diagnosis present

## 2017-11-08 DIAGNOSIS — I10 Essential (primary) hypertension: Secondary | ICD-10-CM

## 2017-11-08 DIAGNOSIS — Z79899 Other long term (current) drug therapy: Secondary | ICD-10-CM | POA: Diagnosis not present

## 2017-11-08 DIAGNOSIS — J154 Pneumonia due to other streptococci: Secondary | ICD-10-CM | POA: Diagnosis present

## 2017-11-08 DIAGNOSIS — Z8673 Personal history of transient ischemic attack (TIA), and cerebral infarction without residual deficits: Secondary | ICD-10-CM | POA: Diagnosis not present

## 2017-11-08 DIAGNOSIS — Z7982 Long term (current) use of aspirin: Secondary | ICD-10-CM | POA: Diagnosis not present

## 2017-11-08 DIAGNOSIS — E785 Hyperlipidemia, unspecified: Secondary | ICD-10-CM | POA: Diagnosis present

## 2017-11-08 LAB — HIV ANTIBODY (ROUTINE TESTING W REFLEX): HIV Screen 4th Generation wRfx: NONREACTIVE

## 2017-11-08 LAB — STREP PNEUMONIAE URINARY ANTIGEN: Strep Pneumo Urinary Antigen: POSITIVE — AB

## 2017-11-08 MED ORDER — GUAIFENESIN ER 600 MG PO TB12
600.0000 mg | ORAL_TABLET | Freq: Two times a day (BID) | ORAL | Status: DC
  Administered 2017-11-08 – 2017-11-10 (×5): 600 mg via ORAL
  Filled 2017-11-08 (×5): qty 1

## 2017-11-08 MED ORDER — POTASSIUM CHLORIDE CRYS ER 10 MEQ PO TBCR
20.0000 meq | EXTENDED_RELEASE_TABLET | Freq: Once | ORAL | Status: AC
Start: 2017-11-08 — End: 2017-11-08
  Administered 2017-11-08: 20 meq via ORAL
  Filled 2017-11-08: qty 2

## 2017-11-08 NOTE — Progress Notes (Signed)
[facility] PROGRESS NOTE  Anna Hoffman [id number] DOB: 06/22/1935 DOA: 11-15-17  PCP: Anna Chard, MD  Brief History/Interval Summary: 83 year old female with a past medical history of hypertension, hyperlipidemia presented to emergency department with complaints of cough fatigue chills.  She was found to have pneumonia and was hospitalized for further management.  Reason for Visit: Community acquired pneumonia  Consultants: None  Procedures: None  Antibiotics: Ceftriaxone and azithromycin  Subjective/Interval History: Patient states that she continues to have a cough and experiencing some soreness in her chest as a result.  Denies any shortness of breath.  No nausea or vomiting.  ROS: Does complain of a headache.  Objective:  Vital Signs  Vitals:   15-Nov-2017 2300 11/08/17 0007 11/08/17 0638 11/08/17 1132  BP: (!) 142/88 (!) 145/73 124/61 116/60  Pulse: (!) 102 (!) 102 84 83  Resp: 15 17 16 18   Temp:  98.1 F (36.7 C) 98.1 F (36.7 C) 98 F (36.7 C)  TempSrc:  Oral Oral Oral  SpO2: 97% 97% 96% 97%  Weight:      Height:        Intake/Output Summary (Last 24 hours) at 11/08/2017 1213 Last data filed at 11/08/2017 1000 Gross per 24 hour  Intake 1240 ml  Output -  Net 1240 ml   Filed Weights   2017/11/15 1849  Weight: 62.6 kg (138 lb)    General appearance: alert, cooperative, appears stated age and no distress Head: Normocephalic, without obvious abnormality, atraumatic Resp: Normal effort at rest.  Coarse breath sounds.  Few crackles at the bases but mostly clear to auscultation. Cardio: regular rate and rhythm, S1, S2 normal, no murmur, click, rub or gallop GI: soft, non-tender; bowel sounds normal; no masses,  no organomegaly Extremities: extremities normal, atraumatic, no cyanosis or edema Neurologic: No focal neurological deficits.  Lab Results:  Data Reviewed: I have personally reviewed following labs and imaging studies  CBC: Recent Labs    Lab 2017-11-15 1936 Nov 15, 2017 2042  WBC 20.1*  --   HGB 12.5 12.6  HCT 37.4 37.0  MCV 91.7  --   PLT 365  --     Basic Metabolic Panel: Recent Labs  Lab 11/15/17 2025 11-15-17 2042  NA 142 142  K 3.3* 3.2*  CL 107 104  CO2 26  --   GLUCOSE 103* 92  BUN 17 18  CREATININE 0.99 1.00  CALCIUM 8.7*  --     GFR: Estimated Creatinine Clearance: 41.2 mL/min (by C-G formula based on SCr of 1 mg/dL).  Liver Function Tests: Recent Labs  Lab 15-Nov-2017 2025  AST 19  ALT 13*  ALKPHOS 78  BILITOT 0.8  PROT 6.8  ALBUMIN 3.6    Recent Labs  Lab 11-15-2017 2025  LIPASE 17    Recent Results (from the past 240 hour(s))  Blood culture (routine x 2)     Status: None (Preliminary result)   Collection Time: 15-Nov-2017 10:31 PM  Result Value Ref Range Status   Specimen Description   Final    BLOOD RIGHT WRIST Performed at [facility] [address], [place], [place] [id number]    Special Requests   Final    BOTTLES DRAWN AEROBIC AND ANAEROBIC Blood Culture adequate volume   Culture PENDING  Incomplete   Report Status PENDING  Incomplete      Radiology Studies: Dg Chest 2 View  Result Date: 11-15-17 CLINICAL DATA:  Nonproductive cough today EXAM: CHEST - 2 VIEW COMPARISON:  September 01, 2014 FINDINGS: The heart size and mediastinal contours are within normal limits. Masslike opacity is identified in the right upper lobe. There is no pleural effusion or pulmonary edema. The visualized skeletal structures are unremarkable. IMPRESSION: Masslike opacity in the right upper lobe. Further evaluation with chest CT is recommended Electronically Signed   By: Abelardo Diesel M.D.   On: 11/07/2017 19:18   Ct Angio Chest Pe W And/or Wo Contrast  Result Date: 11/07/2017 CLINICAL DATA:  Nonproductive cough, chills and body aches since 0300 hours today. Dyspnea. EXAM: CT ANGIOGRAPHY CHEST WITH CONTRAST TECHNIQUE: Multidetector CT imaging of the chest was performed using the  standard protocol during bolus administration of intravenous contrast. Multiplanar CT image reconstructions and MIPs were obtained to evaluate the vascular anatomy. CONTRAST:  100 cc ISOVUE-370 IOPAMIDOL (ISOVUE-370) INJECTION 76% COMPARISON:  Same day CXR FINDINGS: Cardiovascular: Atherosclerosis at the takeoff of the left subclavian artery from the aorta. Ectasia of the ascending thoracic aorta with mild atherosclerosis. No dissection. No acute pulmonary embolus to the segmental and subsegmental levels. Heart size is within normal limits. No pericardial effusion. Left main and three-vessel coronary arteriosclerosis is noted. Mediastinum/Nodes: Fluid-filled esophagus to the upper third question reflux. Moderate-sized hiatal hernia. No mediastinal adenopathy. Mildly enlarged hilar lymph nodes on the right likely reactive. No thyromegaly or mass. Lungs/Pleura: Patchy airspace opacities in the right upper, middle and lower lobes consistent with multifocal pneumonia. As some of these pulmonary opacities are slightly stellate in appearance in the lower lobe in particular, follow-up after treating for pneumonia is recommended to ensure clearance in 4-6 weeks. No effusion or pneumothorax. Upper Abdomen: Unremarkable Musculoskeletal: No aggressive osseous lesions. Spondylosis of the included lower cervical and thoracic spine. Review of the MIP images confirms the above findings. IMPRESSION: 1. Multilobar pulmonary consolidations within the right lung consistent with pneumonia, more so in the right upper lobe. As there are subtle stellate appearing opacities in the right lower lobe, follow-up is recommended fin 4-6 weeks after a trial of antibiotics and to exclude neoplasm 2. Mild right hilar reactive adenopathy. 3. No acute pulmonary embolus. 4. Fluid-filled esophagus suspicious for reflux in the presence of a moderate-sized hiatal hernia. 5. Mild thoracic aortic arteriosclerosis. Left main and three-vessel coronary  arteriosclerosis. Aortic Atherosclerosis (ICD10-I70.0). Electronically Signed   By: Ashley Royalty M.D.   On: 11/07/2017 22:13     Medications:  Scheduled: . amLODipine  5 mg Oral Daily  . aspirin EC  81 mg Oral Daily  . azithromycin  500 mg Oral QHS  . enoxaparin (LOVENOX) injection  40 mg Subcutaneous QHS  . irbesartan  150 mg Oral Daily  . pantoprazole  80 mg Oral Daily  . rosuvastatin  10 mg Oral q1800  . vitamin B-12  1,000 mcg Oral Daily   Continuous: . sodium chloride 250 mL/hr at 11/08/17 0813  . cefTRIAXone (ROCEPHIN)  IV     IPJ:[id number], traMADol  Assessment/Plan:  Principal Problem:   Community acquired pneumonia of right upper lobe of lung ([place]) Active Problems:   Benign essential HTN    Community-acquired pneumonia likely due to strep pneumonia Chest x-ray raised concern for opacity but this was actually an infectious process. Consolidation noted in the right lung on CT scan.  She appears to have pleuritic chest pain.  Patient placed on ceftriaxone and azithromycin.  Cultures are pending.  Wait for clinical improvement.  Mobilize.  Does not appear to be requiring any oxygen.  WBC was noted to be elevated.  Otherwise her vital signs are all stable.  No evidence for sepsis.  Lactic acid level was normal.  Strep pneumonia urinary antigen is positive.  She will need repeat chest x-ray in 4-6 weeks.  History of essential hypertension Continue home medications.  Blood pressure is reasonably well controlled.  History of hyperlipidemia Continue home medications.  DVT Prophylaxis: Lovenox    Code Status: Full code Family Communication: Discussed with the patient Disposition Plan: Management as outlined above.  Mobilize.  Repeat labs tomorrow.    LOS: 0 days   [facility] Pager [phone] 11/08/2017, 12:13 PM  If 7PM-7AM, please contact night-coverage at [email or web address], password [facility]

## 2017-11-09 DIAGNOSIS — D649 Anemia, unspecified: Secondary | ICD-10-CM

## 2017-11-09 LAB — BASIC METABOLIC PANEL
Anion gap: 8 (ref 5–15)
BUN: 11 mg/dL (ref 6–20)
CALCIUM: 8.6 mg/dL — AB (ref 8.9–10.3)
CO2: 21 mmol/L — ABNORMAL LOW (ref 22–32)
CREATININE: 0.8 mg/dL (ref 0.44–1.00)
Chloride: 110 mmol/L (ref 101–111)
GFR calc Af Amer: >60 mL/min (ref >60)
Glucose, Bld: 99 mg/dL (ref 65–99)
Potassium: 4.1 mmol/L (ref 3.5–5.1)
Sodium: 139 mmol/L (ref 135–145)

## 2017-11-09 LAB — CBC
HCT: 32.7 % — ABNORMAL LOW (ref 36.0–46.0)
Hemoglobin: 10.5 g/dL — ABNORMAL LOW (ref 12.0–15.0)
MCH: 30 pg (ref 26.0–34.0)
MCHC: 32.1 g/dL (ref 30.0–36.0)
MCV: 93.4 fL (ref 78.0–100.0)
PLATELETS: 355 10*3/uL (ref 150–400)
RBC: 3.5 MIL/uL — ABNORMAL LOW (ref 3.87–5.11)
RDW: 14.5 % (ref 11.5–15.5)
WBC: 11.5 10*3/uL — AB (ref 4.0–10.5)

## 2017-11-09 MED ORDER — ONDANSETRON HCL 4 MG/2ML IJ SOLN
4.0000 mg | Freq: Four times a day (QID) | INTRAMUSCULAR | Status: DC | PRN
  Administered 2017-11-09: 4 mg via INTRAVENOUS
  Filled 2017-11-09: qty 2

## 2017-11-09 MED ORDER — LEVOFLOXACIN 500 MG PO TABS
500.0000 mg | ORAL_TABLET | Freq: Every day | ORAL | Status: DC
  Administered 2017-11-09 – 2017-11-10 (×2): 500 mg via ORAL
  Filled 2017-11-09 (×2): qty 1

## 2017-11-09 NOTE — Progress Notes (Signed)
[facility] PROGRESS NOTE  Anna Hoffman YDX:[id number] DOB: 1934-08-31 DOA: 11/07/2017  PCP: Glendale Chard, MD  Brief History/Interval Summary: 83 year old female with a past medical history of hypertension, hyperlipidemia presented to emergency department with complaints of cough fatigue chills.  She was found to have pneumonia and was hospitalized for further management.  Reason for Visit: Community acquired pneumonia  Consultants: None  Procedures: None  Antibiotics: Ceftriaxone and azithromycin will be stopped on 4/8 Levaquin 4/8  Subjective/Interval History: Patient continues to have a cough.  Complains of a headache this morning in the frontal area.  She tells me that she has had some eye problems which has been causing her headaches.  No shortness of breath.  Asking if she can walk in the hallway.    ROS: Denies any nausea or vomiting  Objective:  Vital Signs  Vitals:   11/08/17 0638 11/08/17 1132 11/08/17 2230 11/09/17 0513  BP: 124/61 116/60 134/69 139/81  Pulse: 84 83 87 86  Resp: 16 18 18 18   Temp: 98.1 F (36.7 C) 98 F (36.7 C) 97.7 F (36.5 C) 98 F (36.7 C)  TempSrc: Oral Oral Oral Oral  SpO2: 96% 97% 96% 96%  Weight:      Height:        Intake/Output Summary (Last 24 hours) at 11/09/2017 0926 Last data filed at 11/09/2017 0915 Gross per 24 hour  Intake 3705.42 ml  Output -  Net 3705.42 ml   Filed Weights   11/07/17 1849  Weight: 62.6 kg (138 lb)    General appearance: Awake alert.  In no distress Resp: Normal effort.  Coarse breath sounds bilaterally.  Few crackles at the bases but mostly clear to auscultation.  No wheezing.   Cardio: S1-S2 is normal regular.  No S3-S4.  No rubs murmurs or bruit GI: Abdomen is soft.  Nontender nondistended.  Bowel sounds are present.  No masses organomegaly Extremities: No edema Neurologic: No focal neurological deficits  Lab Results:  Data Reviewed: I have personally reviewed following labs and  imaging studies  CBC: Recent Labs  Lab 11/07/17 1936 11/07/17 2042 11/09/17 0509  WBC 20.1*  --  11.5*  HGB 12.5 12.6 10.5*  HCT 37.4 37.0 32.7*  MCV 91.7  --  93.4  PLT 365  --  720    Basic Metabolic Panel: Recent Labs  Lab 11/07/17 2025 11/07/17 2042 11/09/17 0509  NA 142 142 139  K 3.3* 3.2* 4.1  CL 107 104 110  CO2 26  --  21*  GLUCOSE 103* 92 99  BUN 17 18 11   CREATININE 0.99 1.00 0.80  CALCIUM 8.7*  --  8.6*    GFR: Estimated Creatinine Clearance: 51.5 mL/min (by C-G formula based on SCr of 0.8 mg/dL).  Liver Function Tests: Recent Labs  Lab 11/07/17 2025  AST 19  ALT 13*  ALKPHOS 78  BILITOT 0.8  PROT 6.8  ALBUMIN 3.6    Recent Labs  Lab 11/07/17 2025  LIPASE 17    Recent Results (from the past 240 hour(s))  Blood culture (routine x 2)     Status: None (Preliminary result)   Collection Time: 11/07/17 10:31 PM  Result Value Ref Range Status   Specimen Description   Final    BLOOD RIGHT WRIST Performed at [facility] [address], [place], [place] [address]    Special Requests   Final    BOTTLES DRAWN AEROBIC AND ANAEROBIC Blood Culture adequate volume   Culture  PENDING  Incomplete   Report Status PENDING  Incomplete      Radiology Studies: Dg Chest 2 View  Result Date: 11/07/2017 CLINICAL DATA:  Nonproductive cough today EXAM: CHEST - 2 VIEW COMPARISON:  September 01, 2014 FINDINGS: The heart size and mediastinal contours are within normal limits. Masslike opacity is identified in the right upper lobe. There is no pleural effusion or pulmonary edema. The visualized skeletal structures are unremarkable. IMPRESSION: Masslike opacity in the right upper lobe. Further evaluation with chest CT is recommended Electronically Signed   By: Abelardo Diesel M.D.   On: 11/07/2017 19:18   Ct Angio Chest Pe W And/or Wo Contrast  Result Date: 11/07/2017 CLINICAL DATA:  Nonproductive cough, chills and body aches since 0300 hours today.  Dyspnea. EXAM: CT ANGIOGRAPHY CHEST WITH CONTRAST TECHNIQUE: Multidetector CT imaging of the chest was performed using the standard protocol during bolus administration of intravenous contrast. Multiplanar CT image reconstructions and MIPs were obtained to evaluate the vascular anatomy. CONTRAST:  100 cc ISOVUE-370 IOPAMIDOL (ISOVUE-370) INJECTION 76% COMPARISON:  Same day CXR FINDINGS: Cardiovascular: Atherosclerosis at the takeoff of the left subclavian artery from the aorta. Ectasia of the ascending thoracic aorta with mild atherosclerosis. No dissection. No acute pulmonary embolus to the segmental and subsegmental levels. Heart size is within normal limits. No pericardial effusion. Left main and three-vessel coronary arteriosclerosis is noted. Mediastinum/Nodes: Fluid-filled esophagus to the upper third question reflux. Moderate-sized hiatal hernia. No mediastinal adenopathy. Mildly enlarged hilar lymph nodes on the right likely reactive. No thyromegaly or mass. Lungs/Pleura: Patchy airspace opacities in the right upper, middle and lower lobes consistent with multifocal pneumonia. As some of these pulmonary opacities are slightly stellate in appearance in the lower lobe in particular, follow-up after treating for pneumonia is recommended to ensure clearance in 4-6 weeks. No effusion or pneumothorax. Upper Abdomen: Unremarkable Musculoskeletal: No aggressive osseous lesions. Spondylosis of the included lower cervical and thoracic spine. Review of the MIP images confirms the above findings. IMPRESSION: 1. Multilobar pulmonary consolidations within the right lung consistent with pneumonia, more so in the right upper lobe. As there are subtle stellate appearing opacities in the right lower lobe, follow-up is recommended fin 4-6 weeks after a trial of antibiotics and to exclude neoplasm 2. Mild right hilar reactive adenopathy. 3. No acute pulmonary embolus. 4. Fluid-filled esophagus suspicious for reflux in the  presence of a moderate-sized hiatal hernia. 5. Mild thoracic aortic arteriosclerosis. Left main and three-vessel coronary arteriosclerosis. Aortic Atherosclerosis (ICD10-I70.0). Electronically Signed   By: Ashley Royalty M.D.   On: 11/07/2017 22:13     Medications:  Scheduled: . amLODipine  5 mg Oral Daily  . aspirin EC  81 mg Oral Daily  . enoxaparin (LOVENOX) injection  40 mg Subcutaneous QHS  . guaiFENesin  600 mg Oral BID  . irbesartan  150 mg Oral Daily  . levofloxacin  500 mg Oral Daily  . pantoprazole  80 mg Oral Daily  . rosuvastatin  10 mg Oral q1800  . vitamin B-12  1,000 mcg Oral Daily   Continuous:  ZOX:[id number], traMADol  Assessment/Plan:  Principal Problem:   Community acquired pneumonia of right upper lobe of lung ([place]) Active Problems:   Benign essential HTN    Community-acquired pneumonia likely due to strep pneumonia Chest x-ray raised concern for opacity but this was actually an infectious process. Consolidation noted in the right lung on CT scan.  She appears to have pleuritic chest pain.  Patient placed on ceftriaxone and  azithromycin.  Strep pneumonia urinary antigen was positive.  Cultures are negative so far.  Change to oral Levaquin.  Ambulate in the hallway.  She will need repeat chest x-ray in 4-6 weeks.  HIV nonreactive.  WBC has improved.  Lactic acid level was normal.  History of essential hypertension Continue home medications.  Blood pressure is reasonably well controlled.  History of hyperlipidemia Continue home medications.  Hypokalemia Corrected.  Normocytic anemia Drop in hemoglobin is dilutional.  No evidence for overt bleeding.  DVT Prophylaxis: Lovenox    Code Status: Full code Family Communication: Discussed with the patient Disposition Plan: Management as outlined above.  Mobilize.  Anticipate discharge tomorrow.    LOS: 1 day   [facility] Pager [phone] 11/09/2017, 9:26 AM  If 7PM-7AM, please  contact night-coverage at [email or web address], password [email or web address]

## 2017-11-10 MED ORDER — LOPERAMIDE HCL 2 MG PO CAPS
4.0000 mg | ORAL_CAPSULE | Freq: Once | ORAL | Status: AC
  Administered 2017-11-10: 4 mg via ORAL
  Filled 2017-11-10: qty 2

## 2017-11-10 MED ORDER — ONDANSETRON 4 MG PO TBDP: 4.0000 mg | ORAL_TABLET | Freq: Three times a day (TID) | ORAL | 0 refills | Status: DC | PRN

## 2017-11-10 MED ORDER — LEVOFLOXACIN 500 MG PO TABS: 500.0000 mg | ORAL_TABLET | Freq: Every day | ORAL | 0 refills | Status: AC

## 2017-11-10 MED ORDER — SACCHAROMYCES BOULARDII 250 MG PO CAPS
250.0000 mg | ORAL_CAPSULE | Freq: Two times a day (BID) | ORAL | Status: DC
  Administered 2017-11-10: 250 mg via ORAL
  Filled 2017-11-10: qty 1

## 2017-11-10 MED ORDER — SACCHAROMYCES BOULARDII 250 MG PO CAPS: 250.0000 mg | ORAL_CAPSULE | Freq: Two times a day (BID) | ORAL | 0 refills | Status: AC

## 2017-11-10 MED FILL — FLORASTOR 250 MG CAPSULE: 250 | 10 days supply | Qty: 20 | Fill #0

## 2017-11-10 MED FILL — levoFLOXacin 500 MG TABS: 500 | 5 days supply | Qty: 5 | Fill #0

## 2017-11-10 MED FILL — ONDANSETRON ODT 4 MG TABLET: 4 | 6 days supply | Qty: 20 | Fill #0

## 2017-11-10 NOTE — Discharge Instructions (Signed)

## 2017-11-10 NOTE — Discharge Summary (Signed)
[facility]  Physician Discharge Summary   Patient ID: Anna Hoffman MRN: [id number] DOB/AGE: May 04, 1935 83 y.o.  Admit date: 11/07/2017 Discharge date: 11/10/2017  PCP: Glendale Chard, MD  DISCHARGE DIAGNOSES:  Principal Problem:   Community acquired pneumonia of right upper lobe of lung ([place]) Active Problems:   Benign essential HTN   RECOMMENDATIONS FOR OUTPATIENT FOLLOW UP: 1. Patient instructed to follow-up with her PCP within 1 week 2. She will need repeat chest x-ray 4-6 weeks.   DISCHARGE CONDITION: fair  Diet recommendation: As before  [facility] Weights   11/07/17 1849  Weight: 62.6 kg (138 lb)    INITIAL HISTORY: 83 year old female with a past medical history of hypertension, hyperlipidemia presented to emergency department with complaints of cough fatigue chills.  She was found to have pneumonia and was hospitalized for further management.   HOSPITAL COURSE:   Community-acquired pneumonia likely due to strep pneumonia Chest x-ray raised concern for opacity but this was actually an infectious process. Consolidation noted in the right lung on CT scan.  She appears to have pleuritic chest pain.  Patient placed on ceftriaxone and azithromycin.  Strep pneumonia urinary antigen was positive.  Cultures are negative so far.  Changed to oral Levaquin.    She was saturating normal on room air.  She will need repeat chest x-ray in 4-6 weeks.  HIV nonreactive.  WBC has improved.  Lactic acid level was normal.  Nausea vomiting and diarrhea Etiology unclear.  Abdomen remain benign.  Nausea vomiting could be related to coughing episodes.  Resolved this morning.  She has had only 2 episodes of loose stools without any abdominal pain.  Possibly related to antibiotic.  Given Imodium and probiotics.  Has not had any further episodes.  History of essential hypertension Continue home medications.  Blood pressure is reasonably well controlled.  History of hyperlipidemia Continue  home medications.  Hypokalemia Corrected.  Normocytic anemia Drop in hemoglobin is dilutional.  No evidence for overt bleeding.  Patient feels better.  No further episodes of nausea vomiting or diarrhea when reevaluated.  Okay for discharge home.   PERTINENT LABS:  The results of significant diagnostics from this hospitalization (including imaging, microbiology, ancillary and laboratory) are listed below for reference.    Microbiology: Recent Results (from the past 240 hour(s))  Blood culture (routine x 2)     Status: None (Preliminary result)   Collection Time: 11/07/17 10:31 PM  Result Value Ref Range Status   Specimen Description   Final    BLOOD RIGHT WRIST Performed at [facility] [address], [place], [place] [address]    Special Requests   Final    BOTTLES DRAWN AEROBIC AND ANAEROBIC Blood Culture adequate volume   Culture   Final    NO GROWTH 2 DAYS Performed at [facility], [place] [address] [place], [place] [address]    Report Status PENDING  Incomplete  Blood culture (routine x 2)     Status: None (Preliminary result)   Collection Time: 11/07/17 11:03 PM  Result Value Ref Range Status   Specimen Description   Final    BLOOD RIGHT FOREARM Performed at [facility] [address] [place], [place] [address]    Special Requests   Final    BOTTLES DRAWN AEROBIC AND ANAEROBIC Blood Culture adequate volume Performed at [facility] [address], [place],  [address]    Culture   Final    NO GROWTH  2 DAYS Performed at [facility], [place] [address], [place], [place] [address]    Report Status PENDING  Incomplete     Labs: Basic Metabolic Panel: Recent Labs  Lab 11/07/17 2025 11/07/17 2042 11/09/17 0509  NA 142 142 139  K 3.3* 3.2* 4.1  CL 107 104 110  CO2 26  --  21*  GLUCOSE 103* 92 99  BUN 17 18 11   CREATININE 0.99 1.00 0.80  CALCIUM 8.7*  --  8.6*   Liver Function  Tests: Recent Labs  Lab 11/07/17 2025  AST 19  ALT 13*  ALKPHOS 78  BILITOT 0.8  PROT 6.8  ALBUMIN 3.6   Recent Labs  Lab 11/07/17 2025  LIPASE 17   CBC: Recent Labs  Lab 11/07/17 1936 11/07/17 2042 11/09/17 0509  WBC 20.1*  --  11.5*  HGB 12.5 12.6 10.5*  HCT 37.4 37.0 32.7*  MCV 91.7  --  93.4  PLT 365  --  355    IMAGING STUDIES Dg Chest 2 View  Result Date: 11/07/2017 CLINICAL DATA:  Nonproductive cough today EXAM: CHEST - 2 VIEW COMPARISON:  September 01, 2014 FINDINGS: The heart size and mediastinal contours are within normal limits. Masslike opacity is identified in the right upper lobe. There is no pleural effusion or pulmonary edema. The visualized skeletal structures are unremarkable. IMPRESSION: Masslike opacity in the right upper lobe. Further evaluation with chest CT is recommended Electronically Signed   By: Abelardo Diesel M.D.   On: 11/07/2017 19:18   Ct Angio Chest Pe W And/or Wo Contrast  Result Date: 11/07/2017 CLINICAL DATA:  Nonproductive cough, chills and body aches since 0300 hours today. Dyspnea. EXAM: CT ANGIOGRAPHY CHEST WITH CONTRAST TECHNIQUE: Multidetector CT imaging of the chest was performed using the standard protocol during bolus administration of intravenous contrast. Multiplanar CT image reconstructions and MIPs were obtained to evaluate the vascular anatomy. CONTRAST:  100 cc ISOVUE-370 IOPAMIDOL (ISOVUE-370) INJECTION 76% COMPARISON:  Same day CXR FINDINGS: Cardiovascular: Atherosclerosis at the takeoff of the left subclavian artery from the aorta. Ectasia of the ascending thoracic aorta with mild atherosclerosis. No dissection. No acute pulmonary embolus to the segmental and subsegmental levels. Heart size is within normal limits. No pericardial effusion. Left main and three-vessel coronary arteriosclerosis is noted. Mediastinum/Nodes: Fluid-filled esophagus to the upper third question reflux. Moderate-sized hiatal hernia. No mediastinal adenopathy.  Mildly enlarged hilar lymph nodes on the right likely reactive. No thyromegaly or mass. Lungs/Pleura: Patchy airspace opacities in the right upper, middle and lower lobes consistent with multifocal pneumonia. As some of these pulmonary opacities are slightly stellate in appearance in the lower lobe in particular, follow-up after treating for pneumonia is recommended to ensure clearance in 4-6 weeks. No effusion or pneumothorax. Upper Abdomen: Unremarkable Musculoskeletal: No aggressive osseous lesions. Spondylosis of the included lower cervical and thoracic spine. Review of the MIP images confirms the above findings. IMPRESSION: 1. Multilobar pulmonary consolidations within the right lung consistent with pneumonia, more so in the right upper lobe. As there are subtle stellate appearing opacities in the right lower lobe, follow-up is recommended fin 4-6 weeks after a trial of antibiotics and to exclude neoplasm 2. Mild right hilar reactive adenopathy. 3. No acute pulmonary embolus. 4. Fluid-filled esophagus suspicious for reflux in the presence of a moderate-sized hiatal hernia. 5. Mild thoracic aortic arteriosclerosis. Left main and three-vessel coronary arteriosclerosis. Aortic Atherosclerosis (ICD10-I70.0). Electronically Signed   By: Ashley Royalty M.D.   On: 11/07/2017 22:13  DISCHARGE EXAMINATION: Vitals:   11/09/17 0513 11/09/17 1521 11/09/17 2020 11/10/17 0444  BP: 139/81 (!) 152/88 (!) 109/56 119/61  Pulse: 86 89 84 75  Resp: 18 18 19 20   Temp: 98 F (36.7 C) 98.1 F (36.7 C) 98.7 F (37.1 C) 98.3 F (36.8 C)  TempSrc: Oral Oral Oral Oral  SpO2: 96% 96% 96% 96%  Weight:      Height:       General appearance: alert, cooperative, appears stated age and no distress Resp: clear to auscultation bilaterally Cardio: regular rate and rhythm, S1, S2 normal, no murmur, click, rub or gallop GI: soft, non-tender; bowel sounds normal; no masses,  no organomegaly  DISPOSITION: Home  Discharge  Instructions    Call MD for:  difficulty breathing, headache or visual disturbances   Complete by:  As directed    Call MD for:  extreme fatigue   Complete by:  As directed    Call MD for:  persistant dizziness or light-headedness   Complete by:  As directed    Call MD for:  persistant nausea and vomiting   Complete by:  As directed    Call MD for:  severe uncontrolled pain   Complete by:  As directed    Call MD for:  temperature >100.4   Complete by:  As directed    Discharge instructions   Complete by:  As directed    Please be sure to follow up with your PCP in 1 week.  You were cared for by a hospitalist during your hospital stay. If you have any questions about your discharge medications or the care you received while you were in the hospital after you are discharged, you can call the unit and asked to speak with the hospitalist on call if the hospitalist that took care of you is not available. Once you are discharged, your primary care physician will handle any further medical issues. Please note that NO REFILLS for any discharge medications will be authorized once you are discharged, as it is imperative that you return to your primary care physician (or establish a relationship with a primary care physician if you do not have one) for your aftercare needs so that they can reassess your need for medications and monitor your lab values. If you do not have a primary care physician, you can call [phone] for a physician referral.   Increase activity slowly   Complete by:  As directed         Allergies as of 11/10/2017      Reactions   Amoxicillin Diarrhea   Has patient had a PCN reaction causing immediate rash, facial/tongue/throat swelling, SOB or lightheadedness with hypotension: no Has patient had a PCN reaction causing severe rash involving mucus membranes or skin necrosis: no Has patient had a PCN reaction that required hospitalization: no pharmacist consult Has patient had a PCN  reaction occurring within the last 10 years: yes If all of the above answers are "NO", then may proceed with Cephalosporin use.   Ampicillin Rash   Has patient had a PCN reaction causing immediate rash, facial/tongue/throat swelling, SOB or lightheadedness with hypotension: No Has patient had a PCN reaction causing severe rash involving mucus membranes or skin necrosis: No Has patient had a PCN reaction that required hospitalization No Has patient had a PCN reaction occurring within the last 10 years: No If all of the above answers are "NO", then may proceed with Cephalosporin use.   Sulfa Antibiotics Rash  Medication List    TAKE these medications   acetaminophen 325 MG tablet Commonly known as:  TYLENOL Take 2 tablets (650 mg total) by mouth every 6 (six) hours as needed for mild pain (or Fever >/= 101).   amLODipine 5 MG tablet Commonly known as:  NORVASC Take 1 tablet (5 mg total) by mouth daily. What changed:  how much to take   aspirin EC 81 MG tablet Take 81 mg by mouth daily.   diclofenac sodium 1 % Gel Commonly known as:  VOLTAREN Apply 2 g topically 3 (three) times daily.   fexofenadine 180 MG tablet Commonly known as:  ALLEGRA Take 180 mg by mouth daily.   irbesartan 150 MG tablet Commonly known as:  AVAPRO Take 1 tablet (150 mg total) by mouth daily.   levofloxacin 500 MG tablet Commonly known as:  LEVAQUIN Take 1 tablet (500 mg total) by mouth daily for 5 days. Start taking on:  11/11/2017   omeprazole 40 MG capsule Commonly known as:  PRILOSEC Take 1 capsule (40 mg total) by mouth daily.   ondansetron 4 MG disintegrating tablet Commonly known as:  ZOFRAN-ODT Take 1 tablet (4 mg total) by mouth every 8 (eight) hours as needed for nausea or vomiting.   rosuvastatin 10 MG tablet Commonly known as:  CRESTOR Take 1 tablet (10 mg total) by mouth daily. What changed:  how much to take   saccharomyces boulardii 250 MG capsule Commonly known as:   FLORASTOR Take 1 capsule (250 mg total) by mouth 2 (two) times daily for 10 days.   STRESS TAB NF PO Take 1 tablet by mouth daily.   trolamine salicylate 10 % cream Commonly known as:  ASPERCREME Apply 1 application topically as needed for muscle pain.   vitamin B-12 1000 MCG tablet Commonly known as:  CYANOCOBALAMIN Take 1,000 mcg by mouth daily.   vitamin C 1000 MG tablet Take 1,000 mg by mouth daily.        Follow-up Information    Glendale Chard, MD. Schedule an appointment as soon as possible for a visit in 1 week(s).   Specialty:  Internal Medicine Contact information: [address] [place] [address] [phone]           TOTAL DISCHARGE TIME: 35 minutes  Bonnielee Haff  [facility] Pager [phone]  11/10/2017, 2:17 PM

## 2017-11-10 NOTE — Progress Notes (Signed)
Patient given discharge instructions, and verbalized an understanding of all discharge instructions.  Patient agrees with discharge plan, and is being discharged in stable medical condition.  Patient given transportation via wheelchair. 

## 2017-11-13 LAB — CULTURE, BLOOD (ROUTINE X 2)
CULTURE: NO GROWTH
CULTURE: NO GROWTH
SPECIAL REQUESTS: ADEQUATE
Special Requests: ADEQUATE

## 2017-11-18 DIAGNOSIS — I129 Hypertensive chronic kidney disease with stage 1 through stage 4 chronic kidney disease, or unspecified chronic kidney disease: Secondary | ICD-10-CM | POA: Diagnosis not present

## 2017-11-18 DIAGNOSIS — N183 Chronic kidney disease, stage 3 (moderate): Secondary | ICD-10-CM | POA: Diagnosis not present

## 2017-11-18 DIAGNOSIS — J13 Pneumonia due to Streptococcus pneumoniae: Secondary | ICD-10-CM | POA: Diagnosis not present

## 2017-11-18 DIAGNOSIS — E1122 Type 2 diabetes mellitus with diabetic chronic kidney disease: Secondary | ICD-10-CM | POA: Diagnosis not present

## 2017-11-23 ENCOUNTER — Other Ambulatory Visit: Payer: Self-pay | Admitting: Interventional Radiology

## 2017-11-23 DIAGNOSIS — D1771 Benign lipomatous neoplasm of kidney: Secondary | ICD-10-CM

## 2017-11-25 DIAGNOSIS — J189 Pneumonia, unspecified organism: Secondary | ICD-10-CM | POA: Diagnosis not present

## 2017-11-25 DIAGNOSIS — R5383 Other fatigue: Secondary | ICD-10-CM | POA: Diagnosis not present

## 2017-11-25 DIAGNOSIS — J309 Allergic rhinitis, unspecified: Secondary | ICD-10-CM | POA: Diagnosis not present

## 2017-11-25 DIAGNOSIS — D72829 Elevated white blood cell count, unspecified: Secondary | ICD-10-CM | POA: Diagnosis not present

## 2017-12-07 ENCOUNTER — Ambulatory Visit
Admission: RE | Admit: 2017-12-07 | Discharge: 2017-12-07 | Disposition: A | Discharge status: Home / Self Care | Payer: Medicare Other | Source: Ambulatory Visit | Attending: Nurse Practitioner | Admitting: Nurse Practitioner

## 2017-12-07 ENCOUNTER — Other Ambulatory Visit: Payer: Self-pay | Admitting: Nurse Practitioner

## 2017-12-07 DIAGNOSIS — Z09 Encounter for follow-up examination after completed treatment for conditions other than malignant neoplasm: Secondary | ICD-10-CM

## 2017-12-07 DIAGNOSIS — R05 Cough: Secondary | ICD-10-CM | POA: Diagnosis not present

## 2017-12-16 ENCOUNTER — Encounter: Payer: Self-pay | Admitting: Radiology

## 2017-12-16 ENCOUNTER — Ambulatory Visit
Admission: RE | Admit: 2017-12-16 | Discharge: 2017-12-16 | Disposition: A | Discharge status: Home / Self Care | Payer: Medicare Other | Source: Ambulatory Visit | Attending: Interventional Radiology | Admitting: Interventional Radiology

## 2017-12-16 DIAGNOSIS — D1771 Benign lipomatous neoplasm of kidney: Secondary | ICD-10-CM

## 2017-12-16 DIAGNOSIS — N281 Cyst of kidney, acquired: Secondary | ICD-10-CM | POA: Diagnosis not present

## 2017-12-16 DIAGNOSIS — D3002 Benign neoplasm of left kidney: Secondary | ICD-10-CM | POA: Diagnosis not present

## 2017-12-16 HISTORY — PX: IR RADIOLOGIST EVAL & MGMT: IMG5224

## 2017-12-16 NOTE — Progress Notes (Signed)
Chief Complaint: Left kidney AML, SP embolization for hemorrhage  Referring Physician(s): Dr. Dreama Saa  PCP: Dr. Glendale Chard  Urology: Dr. Louis Meckel & Dr. Risa Grill  History of Present Illness: Anna Hoffman is a 83 y.o. 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    The embolization was performed 04/10/2016 as an elective basis.  She has done just fine after our embolization, and again today denies any left flank pain, and denies hematuria.    She has had interval CT imaging performed in April 2018, which showed ~70% volume reduction after embolization.  Today she has renal US performed, and the lesion is nearly undetectable, certainly with no regrowth.   Most of her complaints today are from other symptoms.  She has had recent hospitalization for pneumonia, and was treated with oral ABX.  This left her with severe diarrhea, which she tells me resolved only a few days ago.  Additionally on Sunday, she tells me she started with symptoms of vertigo.    .    Past Medical History:  Diagnosis Date  . Diabetes mellitus without complication ([place])   . Hypertension   . Hypokalemia   . Stroke [facility])     Past Surgical History:  Procedure Laterality Date  . ABDOMINAL HYSTERECTOMY    . BREAST EXCISIONAL BIOPSY Left   . ESOPHAGOGASTRODUODENOSCOPY N/A 09/01/2014   Procedure: ESOPHAGOGASTRODUODENOSCOPY (EGD);  Surgeon: Lear Ng, MD;  Location: [facility];  Service: Endoscopy;  Laterality: N/A;  . HIATAL HERNIA REPAIR N/A 09/04/2014   Procedure: LAPAROSCOPIC REPAIR OF HIATAL HERNIA;  Surgeon: Excell Seltzer, MD;  Location: [facility];  Service: General;  Laterality: N/A;  With MESH  . IR GENERIC HISTORICAL  04/10/2016   IR US GUIDE VASC ACCESS RIGHT 04/10/2016 Corrie Mckusick, DO [facility]  . IR GENERIC HISTORICAL  04/10/2016   IR ANGIOGRAM SELECTIVE EACH ADDITIONAL  VESSEL 04/10/2016 Corrie Mckusick, DO [facility]  . IR GENERIC HISTORICAL  04/10/2016   IR ANGIOGRAM SELECTIVE EACH ADDITIONAL VESSEL 04/10/2016 Corrie Mckusick, DO [facility]  . IR GENERIC HISTORICAL  04/10/2016   IR EMBO TUMOR ORGAN ISCHEMIA INFARCT INC GUIDE ROADMAPPING 04/10/2016 Corrie Mckusick, DO [facility]  . IR GENERIC HISTORICAL  04/10/2016   IR ANGIOGRAM SELECTIVE EACH ADDITIONAL VESSEL 04/10/2016 Corrie Mckusick, DO [facility]  . IR GENERIC HISTORICAL  04/10/2016   IR ANGIOGRAM SELECTIVE EACH ADDITIONAL VESSEL 04/10/2016 Corrie Mckusick, DO [facility]  . IR GENERIC HISTORICAL  04/10/2016   IR RENAL SELECTIVE  UNI INC S&I MOD SED 04/10/2016 Corrie Mckusick, DO [facility]  . IR GENERIC HISTORICAL  03/06/2016   IR RADIOLOGIST EVAL & MGMT 03/06/2016 Corrie Mckusick, DO [facility]  . IR GENERIC HISTORICAL  03/26/2016   IR RADIOLOGIST EVAL & MGMT 03/26/2016 Corrie Mckusick, DO [facility]  . IR GENERIC HISTORICAL  04/29/2016   IR RADIOLOGIST EVAL & MGMT 04/29/2016 [facility]  . IR RADIOLOGIST EVAL & MGMT  11/12/2016  . IR RADIOLOGIST EVAL & MGMT  12/16/2017  . KNEE SURGERY      Allergies: Amoxicillin; Ampicillin; and Sulfa antibiotics  Medications: Prior to Admission medications   Medication Sig Start Date End Date Taking? Authorizing Provider  acetaminophen (TYLENOL) 325 MG tablet Take 2 tablets (650 mg total) by mouth every 6 (six) hours as needed for mild pain (or Fever >/= 101). 02/08/16  Yes Johnson, Clanford L, MD  amLODipine (NORVASC) 5 MG tablet Take 1 tablet (5 mg total) by mouth daily. Patient taking differently: Take 10 mg by mouth daily.  07/11/16  Yes Angiulli, Lavon Paganini, PA-C  Ascorbic Acid (VITAMIN C) 1000 MG tablet Take 1,000 mg by mouth daily.   Yes [provider]  aspirin EC 81 MG tablet Take 81 mg by mouth daily.   Yes [provider]  fexofenadine (ALLEGRA) 180 MG tablet Take 180 mg by mouth daily.   Yes [provider]  Multiple Vitamins-Minerals  (STRESS TAB NF PO) Take 1 tablet by mouth daily.   Yes [provider]  omeprazole (PRILOSEC) 40 MG capsule Take 1 capsule (40 mg total) by mouth daily. 07/11/16  Yes Angiulli, Lavon Paganini, PA-C  rosuvastatin (CRESTOR) 10 MG tablet Take 1 tablet (10 mg total) by mouth daily. Patient taking differently: Take 20 mg by mouth daily.  07/11/16  Yes Angiulli, Lavon Paganini, PA-C  trolamine salicylate (ASPERCREME) 10 % cream Apply 1 application topically as needed for muscle pain.   Yes [provider]  vitamin B-12 (CYANOCOBALAMIN) 1000 MCG tablet Take 1,000 mcg by mouth daily.   Yes [provider]  diclofenac sodium (VOLTAREN) 1 % GEL Apply 2 g topically 3 (three) times daily. Patient not taking: Reported on 11/07/2017 07/11/16   Angiulli, Lavon Paganini, PA-C  irbesartan (AVAPRO) 150 MG tablet Take 1 tablet (150 mg total) by mouth daily. Patient not taking: Reported on 12/16/2017 07/11/16   Angiulli, Lavon Paganini, PA-C  ondansetron (ZOFRAN-ODT) 4 MG disintegrating tablet Take 1 tablet (4 mg total) by mouth every 8 (eight) hours as needed for nausea or vomiting. Patient not taking: Reported on 12/16/2017 11/10/17   Bonnielee Haff, MD     Family History  Problem Relation Age of Onset  . Prostate cancer Father     Social History   Socioeconomic History  . Marital status: Widowed    Spouse name: Not on file  . Number of children: 2  . Years of education: Not on file  . Highest education level: Not on file  Occupational History  . Not on file  Social Needs  . Financial resource strain: Not on file  . Food insecurity:    Worry: Not on file    Inability: Not on file  . Transportation needs:    Medical: Not on file    Non-medical: Not on file  Tobacco Use  . Smoking status: Never Smoker  . Smokeless tobacco: Never Used  Substance and Sexual Activity  . Alcohol use: No  . Drug use: No  . Sexual activity: Never    Birth control/protection: Surgical  Lifestyle  . Physical activity:     Days per week: Not on file    Minutes per session: Not on file  . Stress: Not on file  Relationships  . Social connections:    Talks on phone: Not on file    Gets together: Not on file    Attends religious service: Not on file    Active member of club or organization: Not on file    Attends meetings of clubs or organizations: Not on file    Relationship status: Not on file  Other Topics Concern  . Not on file  Social History Narrative   Son [name] lives with her     Review of Systems: A 12 point ROS discussed and pertinent positives are indicated in the HPI above.  All other systems are  negative.  Review of Systems  Vital Signs: BP (!) 142/77   Pulse 84   Temp 97.9 F (36.6 C) (Oral)   Resp 16   Ht 5' 5.5" (1.664 m)   Wt 138 lb (62.6 kg)   SpO2 98%   BMI 22.62 kg/m   Physical Exam General: 83 yo female appearing younger than stated age.  Well-developed, well-nourished.  No distress. HEENT: Atraumatic, normocephalic.  Conjugate gaze, extra-ocular motor intact. No scleral icterus or scleral injection. No lesions on external ears, nose, lips, or gums.  Oral mucosa moist, pink.  Neck: Symmetric with no goiter enlargement.  Chest/Lungs:  Symmetric chest with inspiration/expiration.  No labored breathing.    Heart:    No JVD appreciated.  Abdomen:  Soft, NT/ND, with + bowel sounds.   Genito-urinary: Deferred Neurologic: Alert & Oriented to person, place, and time.   Normal affect and insight.  Appropriate questions.  Moving all 4 extremities with gross sensory intact.    Imaging: Dg Chest 2 View  Result Date: 12/07/2017 CLINICAL DATA:  Recent pneumonia, cough and shortness of breath, follow-up EXAM: CHEST - 2 VIEW COMPARISON:  CT chest of 11/07/2017 and chest x-ray of the same day FINDINGS: The parenchymal opacities noted better by prior CT of the chest are not definitely seen on current chest x-ray. No pneumonia or effusion is seen. Mediastinal and hilar contours are  unchanged and the heart is within upper limits of normal. No acute bony abnormality is seen. IMPRESSION: No definite active lung disease. Electronically Signed   By: Ivar Drape M.D.   On: 12/07/2017 14:17   Ir Radiologist Eval & Mgmt  Result Date: 12/16/2017 Please refer to notes tab for details about interventional procedure. (Op Note)   Labs:  CBC: Recent Labs    11/07/17 1936 11/07/17 2042 11/09/17 0509  WBC 20.1*  --  11.5*  HGB 12.5 12.6 10.5*  HCT 37.4 37.0 32.7*  PLT 365  --  355    COAGS: No results for input(s): INR, APTT in the last 8760 hours.  BMP: Recent Labs    11/07/17 2025 11/07/17 2042 11/09/17 0509  NA 142 142 139  K 3.3* 3.2* 4.1  CL 107 104 110  CO2 26  --  21*  GLUCOSE 103* 92 99  BUN 17 18 11   CALCIUM 8.7*  --  8.6*  CREATININE 0.99 1.00 0.80  GFRNONAA 52*  --  >60  GFRAA >60  --  >60    LIVER FUNCTION TESTS: Recent Labs    11/07/17 2025  BILITOT 0.8  AST 19  ALT 13*  ALKPHOS 78  PROT 6.8  ALBUMIN 3.6    TUMOR MARKERS: No results for input(s): AFPTM, CEA, CA199, CHROMGRNA in the last 8760 hours.  Assessment and Plan:  Ms Benassi is an 83 yo female SP elective embolization of a left AML in September 2017, with ~70% reduction in size of the AML on prior CT.    [place] today shows that there has been no regrowth, and the lesion is essentially no visualized.  She remains asymptomatic from the standpoint of no flank pain or urinary symptoms.   As far as further surveillance, there is no consensus.  There are limited data that show that if there is significant size reduction after treatment in the first year, there is significantly decreased chance of any further hemorrhage/symptoms.  Given her excellent response and absence of symptoms, I think it is reasonable not to continue routine surveillance.  She  tells me that is better for her, with decreased need for follow up.    I did discuss with her any plans she has for her current  constitutional symptoms and her vertigo.  She tells me that she is going to pick up prescription at Dr. Baird [facility] office.   She has a plan to return to her doctor should she have further difficulty with N/V/D, and right now is simply trying to stay hydrated.  This is reasonable.    Plan: - We will not schedule any further surveillance of the successfully treated AML - I have advised her to observe future physician appointments, and follow through with her plan to go to Dr. Baird [facility] office.    Electronically Signed: Corrie Mckusick 12/16/2017, 12:06 PM   I spent a total of    25 Minutes in face to face in clinical consultation, greater than 50% of which was counseling/coordinating care for left AML, SP embolization

## 2018-01-04 DIAGNOSIS — J209 Acute bronchitis, unspecified: Secondary | ICD-10-CM | POA: Diagnosis not present

## 2018-01-04 DIAGNOSIS — R51 Headache: Secondary | ICD-10-CM | POA: Diagnosis not present

## 2018-01-04 DIAGNOSIS — L309 Dermatitis, unspecified: Secondary | ICD-10-CM | POA: Diagnosis not present

## 2018-01-04 DIAGNOSIS — I129 Hypertensive chronic kidney disease with stage 1 through stage 4 chronic kidney disease, or unspecified chronic kidney disease: Secondary | ICD-10-CM | POA: Diagnosis not present

## 2018-01-05 MED FILL — TRIAMCINOLONE 0.1% CREAM: 0.1 | 10 days supply | Qty: 45 | Fill #0

## 2018-01-29 MED FILL — AMLODIPINE BESYLATE 10 MG T: 10 | 90 days supply | Qty: 90 | Fill #0

## 2018-01-29 MED FILL — ROSUVASTATIN CALCIUM 20 MG: 20 | 90 days supply | Qty: 90 | Fill #0

## 2018-01-29 MED FILL — OMEPRAZOLE 20 MG CAP: 20 | 90 days supply | Qty: 90 | Fill #0

## 2018-02-18 DIAGNOSIS — R0982 Postnasal drip: Secondary | ICD-10-CM | POA: Diagnosis not present

## 2018-02-18 DIAGNOSIS — M67441 Ganglion, right hand: Secondary | ICD-10-CM | POA: Diagnosis not present

## 2018-02-18 DIAGNOSIS — J029 Acute pharyngitis, unspecified: Secondary | ICD-10-CM | POA: Diagnosis not present

## 2018-03-03 DIAGNOSIS — R5383 Other fatigue: Secondary | ICD-10-CM | POA: Diagnosis not present

## 2018-03-03 DIAGNOSIS — R0982 Postnasal drip: Secondary | ICD-10-CM | POA: Diagnosis not present

## 2018-03-03 DIAGNOSIS — K580 Irritable bowel syndrome with diarrhea: Secondary | ICD-10-CM | POA: Diagnosis not present

## 2018-03-23 DIAGNOSIS — E1122 Type 2 diabetes mellitus with diabetic chronic kidney disease: Secondary | ICD-10-CM | POA: Diagnosis not present

## 2018-03-23 DIAGNOSIS — I1310 Hypertensive heart and chronic kidney disease without heart failure, with stage 1 through stage 4 chronic kidney disease, or unspecified chronic kidney disease: Secondary | ICD-10-CM | POA: Diagnosis not present

## 2018-03-23 DIAGNOSIS — E559 Vitamin D deficiency, unspecified: Secondary | ICD-10-CM | POA: Diagnosis not present

## 2018-03-23 DIAGNOSIS — N183 Chronic kidney disease, stage 3 (moderate): Secondary | ICD-10-CM | POA: Diagnosis not present

## 2018-03-23 DIAGNOSIS — N08 Glomerular disorders in diseases classified elsewhere: Secondary | ICD-10-CM | POA: Diagnosis not present

## 2018-03-23 DIAGNOSIS — Z0000 Encounter for general adult medical examination without abnormal findings: Secondary | ICD-10-CM | POA: Diagnosis not present

## 2018-03-23 MED FILL — NYSTATIN 100,000 UNIT/GM CR: 100000 | 15 days supply | Qty: 60 | Fill #0

## 2018-04-30 MED FILL — ROSUVASTATIN CALCIUM 20 MG: 20 | 90 days supply | Qty: 90 | Fill #1

## 2018-04-30 MED FILL — OMEPRAZOLE 20 MG CPDR: 20 | 90 days supply | Qty: 90 | Fill #1

## 2018-04-30 MED FILL — AMLODIPINE BESYLATE 10 MG T: 10 | 90 days supply | Qty: 90 | Fill #1

## 2018-05-11 ENCOUNTER — Other Ambulatory Visit: Payer: Self-pay | Admitting: Internal Medicine

## 2018-05-11 DIAGNOSIS — Z1231 Encounter for screening mammogram for malignant neoplasm of breast: Secondary | ICD-10-CM

## 2018-06-01 MED FILL — IRBESARTAN 150 MG TABLET: 150 | 90 days supply | Qty: 90 | Fill #0

## 2018-06-10 ENCOUNTER — Ambulatory Visit
Admission: RE | Admit: 2018-06-10 | Discharge: 2018-06-10 | Disposition: A | Discharge status: Home / Self Care | Payer: Medicare HMO | Source: Ambulatory Visit | Attending: Internal Medicine | Admitting: Internal Medicine

## 2018-06-10 DIAGNOSIS — Z1231 Encounter for screening mammogram for malignant neoplasm of breast: Secondary | ICD-10-CM | POA: Diagnosis not present

## 2018-07-12 ENCOUNTER — Ambulatory Visit: Payer: Medicare Other | Admitting: Nurse Practitioner

## 2018-07-13 ENCOUNTER — Encounter: Payer: Self-pay | Admitting: Internal Medicine

## 2018-07-13 ENCOUNTER — Ambulatory Visit (INDEPENDENT_AMBULATORY_CARE_PROVIDER_SITE_OTHER): Payer: Medicare HMO | Admitting: Internal Medicine

## 2018-07-13 ENCOUNTER — Ambulatory Visit
Admission: RE | Admit: 2018-07-13 | Discharge: 2018-07-13 | Disposition: A | Discharge status: Home / Self Care | Payer: Medicare HMO | Source: Ambulatory Visit | Attending: Internal Medicine | Admitting: Internal Medicine

## 2018-07-13 VITALS — BP 130/70 | HR 105 | Temp 97.7°F | Ht 65.5 in | Wt 143.4 lb

## 2018-07-13 DIAGNOSIS — R0602 Shortness of breath: Secondary | ICD-10-CM

## 2018-07-13 DIAGNOSIS — R5383 Other fatigue: Secondary | ICD-10-CM

## 2018-07-13 DIAGNOSIS — R05 Cough: Secondary | ICD-10-CM | POA: Diagnosis not present

## 2018-07-13 DIAGNOSIS — R059 Cough, unspecified: Secondary | ICD-10-CM

## 2018-07-13 LAB — CBC WITH DIFFERENTIAL/PLATELET
BASOS ABS: 0.1 10*3/uL (ref 0.0–0.2)
Basos: 1 %
EOS (ABSOLUTE): 0.1 10*3/uL (ref 0.0–0.4)
Eos: 1 %
Hematocrit: 38.9 % (ref 34.0–46.6)
Hemoglobin: 12.7 g/dL (ref 11.1–15.9)
Immature Grans (Abs): 0 10*3/uL (ref 0.0–0.1)
Immature Granulocytes: 0 %
LYMPHS: 42 %
Lymphocytes Absolute: 3.8 10*3/uL — ABNORMAL HIGH (ref 0.7–3.1)
MCH: 28.9 pg (ref 26.6–33.0)
MCHC: 32.6 g/dL (ref 31.5–35.7)
MCV: 89 fL (ref 79–97)
Monocytes Absolute: 0.8 10*3/uL (ref 0.1–0.9)
Monocytes: 9 %
Neutrophils Absolute: 4.3 10*3/uL (ref 1.4–7.0)
Neutrophils: 47 %
Platelets: 413 10*3/uL (ref 150–450)
RBC: 4.39 x10E6/uL (ref 3.77–5.28)
RDW: 13.3 % (ref 12.3–15.4)
WBC: 9.1 10*3/uL (ref 3.4–10.8)

## 2018-07-13 MED ORDER — CEFTRIAXONE SODIUM 1 G IJ SOLR
1.0000 g | Freq: Once | INTRAMUSCULAR | Status: AC
  Administered 2018-07-13: 1 g via INTRAMUSCULAR

## 2018-07-13 MED ORDER — ALBUTEROL SULFATE (2.5 MG/3ML) 0.083% IN NEBU
2.5000 mg | INHALATION_SOLUTION | Freq: Once | RESPIRATORY_TRACT | Status: AC
  Administered 2018-07-13: 2.5 mg via RESPIRATORY_TRACT

## 2018-07-13 NOTE — Progress Notes (Signed)
Subjective:     Patient ID: Anna Hoffman , female    DOB: 1934-10-30 , 83 y.o.   MRN: [id number]   Chief Complaint  Patient presents with  . Nasal Congestion    coughing     HPI Has been having cold symptoms with a lot of coughing, has been feeling tired, weak, chest is sore with deep breaths and cough. Similar to when she had pneumonia in the past. She felt she had fever and was sweating 2 days ago. She also had a HA, and drank water and HA resolved.  Her glcuose have been in the upp 90's. Denies long car rides or flights. Denies chest pain or calf tenderness or swelling.  Past Medical History:  Diagnosis Date  . Diabetes mellitus without complication ([place])   . Hypertension   . Hypokalemia   . Stroke [facility])      Family History  Problem Relation Age of Onset  . Prostate cancer Father      Current Outpatient Medications:  .  acetaminophen (TYLENOL) 325 MG tablet, Take 2 tablets (650 mg total) by mouth every 6 (six) hours as needed for mild pain (or Fever >/= 101)., Disp: , Rfl:  .  amLODipine (NORVASC) 10 MG tablet, Take 10 mg by mouth daily., Disp: , Rfl: 2 .  Ascorbic Acid (VITAMIN C) 1000 MG tablet, Take 1,000 mg by mouth daily., Disp: , Rfl:  .  aspirin EC 81 MG tablet, Take 81 mg by mouth daily., Disp: , Rfl:  .  fexofenadine (ALLEGRA) 180 MG tablet, Take 180 mg by mouth daily., Disp: , Rfl:  .  irbesartan (AVAPRO) 150 MG tablet, Take 1 tablet (150 mg total) by mouth daily., Disp: 30 tablet, Rfl: 1 .  Multiple Vitamins-Minerals (STRESS TAB NF PO), Take 1 tablet by mouth daily., Disp: , Rfl:  .  omeprazole (PRILOSEC) 40 MG capsule, Take 1 capsule (40 mg total) by mouth daily., Disp: 30 capsule, Rfl: 1 .  ondansetron (ZOFRAN-ODT) 4 MG disintegrating tablet, Take 1 tablet (4 mg total) by mouth every 8 (eight) hours as needed for nausea or vomiting., Disp: 20 tablet, Rfl: 0 .  rosuvastatin (CRESTOR) 10 MG tablet, Take 1 tablet (10 mg total) by mouth daily. (Patient taking  differently: Take 20 mg by mouth daily. ), Disp: 30 tablet, Rfl: 1 .  trolamine salicylate (ASPERCREME) 10 % cream, Apply 1 application topically as needed for muscle pain., Disp: , Rfl:  .  vitamin B-12 (CYANOCOBALAMIN) 1000 MCG tablet, Take 1,000 mcg by mouth daily., Disp: , Rfl:    Allergies  Allergen Reactions  . Amoxicillin Diarrhea    Has patient had a PCN reaction causing immediate rash, facial/tongue/throat swelling, SOB or lightheadedness with hypotension: no Has patient had a PCN reaction causing severe rash involving mucus membranes or skin necrosis: no Has patient had a PCN reaction that required hospitalization: no pharmacist consult Has patient had a PCN reaction occurring within the last 10 years: yes If all of the above answers are "NO", then may proceed with Cephalosporin use.   . Ampicillin Rash    Has patient had a PCN reaction causing immediate rash, facial/tongue/throat swelling, SOB or lightheadedness with hypotension: No Has patient had a PCN reaction causing severe rash involving mucus membranes or skin necrosis: No Has patient had a PCN reaction that required hospitalization No Has patient had a PCN reaction occurring within the last 10 years: No If all of the above answers are "NO", then may proceed  with Cephalosporin use.   . Sulfa Antibiotics Rash     Review of Systems  Constitutional: Positive for chills, diaphoresis and fatigue. Negative for fever.  HENT: Positive for congestion, postnasal drip, rhinorrhea and sneezing. Negative for sore throat, trouble swallowing and voice change.   Eyes: Negative for discharge.  Respiratory: Positive for cough and chest tightness. Negative for shortness of breath and wheezing.        Chest soreness on lower anterior ribs bilaterally  Cardiovascular: Negative for chest pain.  Gastrointestinal: Negative for abdominal pain, diarrhea, nausea and vomiting.  Genitourinary: Negative for dysuria, frequency and urgency.   Musculoskeletal: Negative for gait problem.  Skin: Negative for rash.  Neurological: Positive for headaches. Negative for dizziness, syncope, weakness, light-headedness and numbness.  Hematological: Negative for adenopathy.   Today's Vitals   07/13/18 1415  BP: 130/70  Pulse: (!) 105  Temp: 97.7 F (36.5 C)  TempSrc: Oral  SpO2: 94%  Weight: 143 lb 6.4 oz (65 kg)  Height: 5' 5.5" (1.664 m)   Body mass index is 23.5 kg/m.   Objective:  Physical Exam   Constitutional: She is oriented to person, place, and time. She appears well-developed and well-nourished. No distress.  HENT: normal TM's, Pharynx- clear. Nose- normal mucosa with clear mucous Head: Normocephalic and atraumatic.  Right Ear: External ear normal.  Left Ear: External ear normal.  Nose: Nose normal.  Eyes: Conjunctivae are normal. Right eye exhibits no discharge. Left eye exhibits no discharge. No scleral icterus.  Neck: Neck supple. No thyromegaly present.  No carotid bruits bilaterally  Cardiovascular: Normal rate and regular rhythm.  No murmur heard. Pulmonary/Chest: Effort normal and breath sounds normal. No respiratory distress.  Musculoskeletal: Normal range of motion. She exhibits no edema.  Lymphadenopathy:    She has no cervical adenopathy.  Neurological: She is alert and oriented to person, place, and time.  Skin: Skin is warm and dry. Capillary refill takes less than 2 seconds. No rash noted. She is not diaphoretic.  Psychiatric: She has a normal mood and affect. Her behavior is normal. Judgment and thought content normal.  Nursing note reviewed.  Post albuterol neg pulse Ox 92% Assessment And Plan:   Persistent cough with SOB, concerned of pneumonia.- I sent her to get a chest. In the mean time she was given Rocephin 1G IM I will call her when I see the results of her CXR  Albion Weatherholtz Nixon, PA-C

## 2018-07-19 ENCOUNTER — Ambulatory Visit (INDEPENDENT_AMBULATORY_CARE_PROVIDER_SITE_OTHER): Payer: Medicare HMO | Admitting: Internal Medicine

## 2018-07-19 ENCOUNTER — Encounter: Payer: Self-pay | Admitting: Internal Medicine

## 2018-07-19 VITALS — BP 130/84 | HR 87 | Temp 97.9°F | Ht 65.5 in | Wt 137.6 lb

## 2018-07-19 DIAGNOSIS — E1122 Type 2 diabetes mellitus with diabetic chronic kidney disease: Secondary | ICD-10-CM

## 2018-07-19 DIAGNOSIS — N183 Chronic kidney disease, stage 3 unspecified: Secondary | ICD-10-CM

## 2018-07-19 DIAGNOSIS — R197 Diarrhea, unspecified: Secondary | ICD-10-CM | POA: Diagnosis not present

## 2018-07-19 DIAGNOSIS — J3089 Other allergic rhinitis: Secondary | ICD-10-CM | POA: Diagnosis not present

## 2018-07-19 DIAGNOSIS — N1831 Chronic kidney disease, stage 3a: Secondary | ICD-10-CM | POA: Insufficient documentation

## 2018-07-19 DIAGNOSIS — I129 Hypertensive chronic kidney disease with stage 1 through stage 4 chronic kidney disease, or unspecified chronic kidney disease: Secondary | ICD-10-CM | POA: Diagnosis not present

## 2018-07-19 LAB — CMP14+EGFR
ALBUMIN: 4.5 g/dL (ref 3.5–4.7)
ALT: 24 IU/L (ref 0–32)
AST: 23 IU/L (ref 0–40)
Albumin/Globulin Ratio: 2 (ref 1.2–2.2)
Alkaline Phosphatase: 96 IU/L (ref 39–117)
BUN / CREAT RATIO: 18 (ref 12–28)
BUN: 20 mg/dL (ref 8–27)
Bilirubin Total: 0.3 mg/dL (ref 0.0–1.2)
CO2: 20 mmol/L (ref 20–29)
Calcium: 9.3 mg/dL (ref 8.7–10.3)
Chloride: 106 mmol/L (ref 96–106)
Creatinine, Ser: 1.1 mg/dL — ABNORMAL HIGH (ref 0.57–1.00)
GFR calc non Af Amer: 47 mL/min/{1.73_m2} — ABNORMAL LOW (ref >59)
GFR, EST AFRICAN AMERICAN: 54 mL/min/{1.73_m2} — AB (ref >59)
GLOBULIN, TOTAL: 2.3 g/dL (ref 1.5–4.5)
Glucose: 113 mg/dL — ABNORMAL HIGH (ref 65–99)
Potassium: 4.3 mmol/L (ref 3.5–5.2)
Sodium: 142 mmol/L (ref 134–144)
TOTAL PROTEIN: 6.8 g/dL (ref 6.0–8.5)

## 2018-07-19 LAB — HEMOGLOBIN A1C
Est. average glucose Bld gHb Est-mCnc: 128 mg/dL
Hgb A1c MFr Bld: 6.1 % — ABNORMAL HIGH (ref 4.8–5.6)

## 2018-07-19 NOTE — Progress Notes (Signed)
Subjective:     Patient ID: Anna Hoffman , female    DOB: Jan 10, 1935 , 83 y.o.   MRN: [id number]   Chief Complaint  Patient presents with  . Diabetes  . Hypertension    HPI  Diabetes  She presents for her follow-up diabetic visit. She has type 2 diabetes mellitus. Her disease course has been stable. There are no hypoglycemic associated symptoms. Pertinent negatives for hypoglycemia include no headaches. Pertinent negatives for diabetes include no blurred vision and no chest pain. There are no hypoglycemic complications. Risk factors for coronary artery disease include diabetes mellitus, dyslipidemia, hypertension, stress and post-menopausal. She is compliant with treatment most of the time. Her weight is stable. She is following a diabetic diet. Her breakfast blood glucose is taken between 7-8 am. Her breakfast blood glucose range is generally 90-110 mg/dl.  Hypertension  This is a chronic problem. The current episode started more than 1 year ago. The problem has been gradually improving since onset. The problem is controlled. Pertinent negatives include no blurred vision, chest pain, headaches or shortness of breath.     Past Medical History:  Diagnosis Date  . Diabetes mellitus without complication ([place])   . Hypertension   . Hypokalemia   . Stroke [facility])      Family History  Problem Relation Age of Onset  . Alzheimer's disease Mother   . Prostate cancer Father   . Brain cancer Brother   . Brain cancer Brother      Current Outpatient Medications:  .  acetaminophen (TYLENOL) 325 MG tablet, Take 2 tablets (650 mg total) by mouth every 6 (six) hours as needed for mild pain (or Fever >/= 101)., Disp: , Rfl:  .  amLODipine (NORVASC) 10 MG tablet, Take 10 mg by mouth daily., Disp: , Rfl: 2 .  Ascorbic Acid (VITAMIN C) 1000 MG tablet, Take 1,000 mg by mouth daily., Disp: , Rfl:  .  aspirin EC 81 MG tablet, Take 81 mg by mouth daily., Disp: , Rfl:  .  fexofenadine (ALLEGRA) 180 MG  tablet, Take 180 mg by mouth daily., Disp: , Rfl:  .  irbesartan (AVAPRO) 150 MG tablet, Take 1 tablet (150 mg total) by mouth daily., Disp: 30 tablet, Rfl: 1 .  Multiple Vitamins-Minerals (STRESS TAB NF PO), Take 1 tablet by mouth daily., Disp: , Rfl:  .  omeprazole (PRILOSEC) 40 MG capsule, Take 1 capsule (40 mg total) by mouth daily., Disp: 30 capsule, Rfl: 1 .  ondansetron (ZOFRAN-ODT) 4 MG disintegrating tablet, Take 1 tablet (4 mg total) by mouth every 8 (eight) hours as needed for nausea or vomiting., Disp: 20 tablet, Rfl: 0 .  rosuvastatin (CRESTOR) 10 MG tablet, Take 1 tablet (10 mg total) by mouth daily. (Patient taking differently: Take 20 mg by mouth daily. ), Disp: 30 tablet, Rfl: 1 .  trolamine salicylate (ASPERCREME) 10 % cream, Apply 1 application topically as needed for muscle pain., Disp: , Rfl:  .  vitamin B-12 (CYANOCOBALAMIN) 1000 MCG tablet, Take 1,000 mcg by mouth daily., Disp: , Rfl:    Allergies  Allergen Reactions  . Amoxicillin Diarrhea    Has patient had a PCN reaction causing immediate rash, facial/tongue/throat swelling, SOB or lightheadedness with hypotension: no Has patient had a PCN reaction causing severe rash involving mucus membranes or skin necrosis: no Has patient had a PCN reaction that required hospitalization: no pharmacist consult Has patient had a PCN reaction occurring within the last 10 years: yes If all of  the above answers are "NO", then may proceed with Cephalosporin use.   . Ampicillin Rash    Has patient had a PCN reaction causing immediate rash, facial/tongue/throat swelling, SOB or lightheadedness with hypotension: No Has patient had a PCN reaction causing severe rash involving mucus membranes or skin necrosis: No Has patient had a PCN reaction that required hospitalization No Has patient had a PCN reaction occurring within the last 10 years: No If all of the above answers are "NO", then may proceed with Cephalosporin use.   . Sulfa  Antibiotics Rash     Review of Systems  Constitutional: Negative.   Eyes: Negative for blurred vision.  Respiratory: Negative.  Negative for shortness of breath.   Cardiovascular: Negative.  Negative for chest pain.  Gastrointestinal: Positive for diarrhea (states her sx have improved with use of viberzi).  Neurological: Negative.  Negative for headaches.  Psychiatric/Behavioral: Negative.      Today's Vitals   07/19/18 1412  BP: 130/84  Pulse: 87  Temp: 97.9 F (36.6 C)  TempSrc: Oral  Weight: 137 lb 9.6 oz (62.4 kg)  Height: 5' 5.5" (1.664 m)  PainSc: 2   PainLoc: Knee   Body mass index is 22.55 kg/m.   Objective:  Physical Exam Vitals signs and nursing note reviewed.  Constitutional:      Appearance: Normal appearance.  HENT:     Head: Normocephalic and atraumatic.  Neck:     Musculoskeletal: Normal range of motion.  Cardiovascular:     Rate and Rhythm: Normal rate and regular rhythm.     Heart sounds: Normal heart sounds.  Pulmonary:     Effort: Pulmonary effort is normal.     Breath sounds: Normal breath sounds.  Skin:    General: Skin is warm and dry.  Neurological:     General: No focal deficit present.     Mental Status: She is alert.         Assessment And Plan:     1. Type 2 diabetes mellitus with stage 3 chronic kidney disease, without long-term current use of insulin ([place])  I will check labs as listed below. She is encouraged to avoid artificially sweetened beverages.   - CMP14+EGFR - Hemoglobin A1c  2. Chronic renal disease, stage III ([name])  Chronic. She is encouraged to stay well hydrated.   3. Hypertensive nephropathy  Well controlled, She will continue with current meds. She is encouraged to avoid adding salt to her foods.   4. Diarrhea, unspecified type  Chronic, idiopathic. Improved with use of Viberzi nightly.   5. Non-seasonal allergic rhinitis, unspecified trigger  She will stop allegra. She will take 1/2 zyrtec nightly.  She will let me know if her sx persist.   Maximino Greenland, MD

## 2018-07-19 NOTE — Patient Instructions (Signed)
Diabetes Mellitus and Nutrition When you have diabetes (diabetes mellitus), it is very important to have healthy eating habits because your blood sugar (glucose) levels are greatly affected by what you eat and drink. Eating healthy foods in the appropriate amounts, at about the same times every day, can help you:  Control your blood glucose.  Lower your risk of heart disease.  Improve your blood pressure.  Reach or maintain a healthy weight.  Every person with diabetes is different, and each person has different needs for a meal plan. Your health care provider may recommend that you work with a diet and nutrition specialist (dietitian) to make a meal plan that is best for you. Your meal plan may vary depending on factors such as:  The calories you need.  The medicines you take.  Your weight.  Your blood glucose, blood pressure, and cholesterol levels.  Your activity level.  Other health conditions you have, such as heart or kidney disease.  How do carbohydrates affect me? Carbohydrates affect your blood glucose level more than any other type of food. Eating carbohydrates naturally increases the amount of glucose in your blood. Carbohydrate counting is a method for keeping track of how many carbohydrates you eat. Counting carbohydrates is important to keep your blood glucose at a healthy level, especially if you use insulin or take certain oral diabetes medicines. It is important to know how many carbohydrates you can safely have in each meal. This is different for every person. Your dietitian can help you calculate how many carbohydrates you should have at each meal and for snack. Foods that contain carbohydrates include:  Bread, cereal, rice, pasta, and crackers.  Potatoes and corn.  Peas, beans, and lentils.  Milk and yogurt.  Fruit and juice.  Desserts, such as cakes, cookies, ice cream, and candy.  How does alcohol affect me? Alcohol can cause a sudden decrease in blood  glucose (hypoglycemia), especially if you use insulin or take certain oral diabetes medicines. Hypoglycemia can be a life-threatening condition. Symptoms of hypoglycemia (sleepiness, dizziness, and confusion) are similar to symptoms of having too much alcohol. If your health care provider says that alcohol is safe for you, follow these guidelines:  Limit alcohol intake to no more than 1 drink per day for nonpregnant women and 2 drinks per day for men. One drink equals 12 oz of beer, 5 oz of wine, or 1 oz of hard liquor.  Do not drink on an empty stomach.  Keep yourself hydrated with water, diet soda, or unsweetened iced tea.  Keep in mind that regular soda, juice, and other mixers may contain a lot of sugar and must be counted as carbohydrates.  What are tips for following this plan? Reading food labels  Start by checking the serving size on the label. The amount of calories, carbohydrates, fats, and other nutrients listed on the label are based on one serving of the food. Many foods contain more than one serving per package.  Check the total grams (g) of carbohydrates in one serving. You can calculate the number of servings of carbohydrates in one serving by dividing the total carbohydrates by 15. For example, if a food has 30 g of total carbohydrates, it would be equal to 2 servings of carbohydrates.  Check the number of grams (g) of saturated and trans fats in one serving. Choose foods that have low or no amount of these fats.  Check the number of milligrams (mg) of sodium in one serving. Most people   should limit total sodium intake to less than 2,300 mg per day.  Always check the nutrition information of foods labeled as "low-fat" or "nonfat". These foods may be higher in added sugar or refined carbohydrates and should be avoided.  Talk to your dietitian to identify your daily goals for nutrients listed on the label. Shopping  Avoid buying canned, premade, or processed foods. These  foods tend to be high in fat, sodium, and added sugar.  Shop around the outside edge of the grocery store. This includes fresh fruits and vegetables, bulk grains, fresh meats, and fresh dairy. Cooking  Use low-heat cooking methods, such as baking, instead of high-heat cooking methods like deep frying.  Cook using healthy oils, such as olive, canola, or sunflower oil.  Avoid cooking with butter, cream, or high-fat meats. Meal planning  Eat meals and snacks regularly, preferably at the same times every day. Avoid going long periods of time without eating.  Eat foods high in fiber, such as fresh fruits, vegetables, beans, and whole grains. Talk to your dietitian about how many servings of carbohydrates you can eat at each meal.  Eat 4-6 ounces of lean protein each day, such as lean meat, chicken, fish, eggs, or tofu. 1 ounce is equal to 1 ounce of meat, chicken, or fish, 1 egg, or 1/4 cup of tofu.  Eat some foods each day that contain healthy fats, such as avocado, nuts, seeds, and fish. Lifestyle   Check your blood glucose regularly.  Exercise at least 30 minutes 5 or more days each week, or as told by your health care provider.  Take medicines as told by your health care provider.  Do not use any products that contain nicotine or tobacco, such as cigarettes and e-cigarettes. If you need help quitting, ask your health care provider.  Work with a counselor or diabetes educator to identify strategies to manage stress and any emotional and social challenges. What are some questions to ask my health care provider?  Do I need to meet with a diabetes educator?  Do I need to meet with a dietitian?  What number can I call if I have questions?  When are the best times to check my blood glucose? Where to find more information:  American Diabetes Association: diabetes.org/food-and-fitness/food  Academy of Nutrition and Dietetics:  www.eatright.org/resources/health/diseases-and-conditions/diabetes  National Institute of Diabetes and Digestive and Kidney Diseases (NIH): www.niddk.nih.gov/health-information/diabetes/overview/diet-eating-physical-activity Summary  A healthy meal plan will help you control your blood glucose and maintain a healthy lifestyle.  Working with a diet and nutrition specialist (dietitian) can help you make a meal plan that is best for you.  Keep in mind that carbohydrates and alcohol have immediate effects on your blood glucose levels. It is important to count carbohydrates and to use alcohol carefully. This information is not intended to replace advice given to you by your health care provider. Make sure you discuss any questions you have with your health care provider. Document Released: 04/17/2005 Document Revised: 08/25/2016 Document Reviewed: 08/25/2016 Elsevier Interactive Patient Education  2018 Elsevier Inc.  

## 2018-07-20 NOTE — Progress Notes (Signed)
Here are your lab results:  Your kidney function is stable. Be sure to stay well hydrated. Your liver function is normal. Your hba1c is 6.1, this is great!   Happy holidays to you and your family!  Sincerely,    Prudence Heiny N. Baird Cancer, MD

## 2018-07-29 MED FILL — AMLODIPINE BESYLATE 10 MG T: 10 | 90 days supply | Qty: 90 | Fill #2

## 2018-07-29 MED FILL — OMEPRAZOLE 20 MG CPDR: 20 | 90 days supply | Qty: 90 | Fill #2

## 2018-07-29 MED FILL — ROSUVASTATIN CALCIUM 20 MG: 20 | 90 days supply | Qty: 90 | Fill #2

## 2018-08-12 ENCOUNTER — Other Ambulatory Visit: Payer: Self-pay

## 2018-08-12 ENCOUNTER — Encounter: Payer: Self-pay | Admitting: Internal Medicine

## 2018-08-12 ENCOUNTER — Ambulatory Visit (INDEPENDENT_AMBULATORY_CARE_PROVIDER_SITE_OTHER): Payer: Medicare Other | Admitting: Internal Medicine

## 2018-08-12 VITALS — BP 122/62 | HR 100 | Temp 98.2°F | Ht 64.3 in | Wt 144.4 lb

## 2018-08-12 DIAGNOSIS — Z79899 Other long term (current) drug therapy: Secondary | ICD-10-CM | POA: Insufficient documentation

## 2018-08-12 DIAGNOSIS — J029 Acute pharyngitis, unspecified: Secondary | ICD-10-CM

## 2018-08-12 DIAGNOSIS — J301 Allergic rhinitis due to pollen: Secondary | ICD-10-CM

## 2018-08-12 DIAGNOSIS — J329 Chronic sinusitis, unspecified: Secondary | ICD-10-CM

## 2018-08-12 MED ORDER — DESLORATADINE 5 MG PO TABS: 5.0000 mg | ORAL_TABLET | Freq: Every day | ORAL | 0 refills | Status: DC

## 2018-08-12 MED ORDER — AZITHROMYCIN 500 MG PO TABS: 500.0000 mg | ORAL_TABLET | Freq: Every day | ORAL | 0 refills | Status: AC

## 2018-08-12 MED FILL — DESLORATADINE 5 MG TAB: 5 | 30 days supply | Qty: 30 | Fill #0

## 2018-08-12 MED FILL — AZITHROMYCIN 500 MG TABLET: 500 | 5 days supply | Qty: 5 | Fill #0

## 2018-08-12 NOTE — Patient Instructions (Signed)
Stop taking allegra for 5 days, and dont start the new allergy pill called Clarinex after done with the antibiotic.  Keep on doing the saline rinses twice a day, but not at bed time.    Sinusitis, Adult Sinusitis is soreness and swelling (inflammation) of your sinuses. Sinuses are hollow spaces in the bones around your face. They are located:  Around your eyes.  In the middle of your forehead.  Behind your nose.  In your cheekbones. Your sinuses and nasal passages are lined with a fluid called mucus. Mucus drains out of your sinuses. Swelling can trap mucus in your sinuses. This lets germs (bacteria, virus, or fungus) grow, which leads to infection. Most of the time, this condition is caused by a virus. What are the causes? This condition is caused by:  Allergies.  Asthma.  Germs.  Things that block your nose or sinuses.  Growths in the nose (nasal polyps).  Chemicals or irritants in the air.  Fungus (rare). What increases the risk? You are more likely to develop this condition if:  You have a weak body defense system (immune system).  You do a lot of swimming or diving.  You use nasal sprays too much.  You smoke. What are the signs or symptoms? The main symptoms of this condition are pain and a feeling of pressure around the sinuses. Other symptoms include:  Stuffy nose (congestion).  Runny nose (drainage).  Swelling and warmth in the sinuses.  Headache.  Toothache.  A cough that may get worse at night.  Mucus that collects in the throat or the back of the nose (postnasal drip).  Being unable to smell and taste.  Being very tired (fatigue).  A fever.  Sore throat.  Bad breath. How is this diagnosed? This condition is diagnosed based on:  Your symptoms.  Your medical history.  A physical exam.  Tests to find out if your condition is short-term (acute) or long-term (chronic). Your doctor may: ? Check your nose for growths (polyps). ? Check  your sinuses using a tool that has a light (endoscope). ? Check for allergies or germs. ? Do imaging tests, such as an MRI or CT scan. How is this treated? Treatment for this condition depends on the cause and whether it is short-term or long-term.  If caused by a virus, your symptoms should go away on their own within 10 days. You may be given medicines to relieve symptoms. They include: ? Medicines that shrink swollen tissue in the nose. ? Medicines that treat allergies (antihistamines). ? A spray that treats swelling of the nostrils. ? Rinses that help get rid of thick mucus in your nose (nasal saline washes).  If caused by bacteria, your doctor may wait to see if you will get better without treatment. You may be given antibiotic medicine if you have: ? A very bad infection. ? A weak body defense system.  If caused by growths in the nose, you may need to have surgery. Follow these instructions at home: Medicines  Take, use, or apply over-the-counter and prescription medicines only as told by your doctor. These may include nasal sprays.  If you were prescribed an antibiotic medicine, take it as told by your doctor. Do not stop taking the antibiotic even if you start to feel better. Hydrate and humidify   Drink enough water to keep your pee (urine) pale yellow.  Use a cool mist humidifier to keep the humidity level in your home above 50%.  Breathe in  steam for 10-15 minutes, 3-4 times a day, or as told by your doctor. You can do this in the bathroom while a hot shower is running.  Try not to spend time in cool or dry air. Rest  Rest as much as you can.  Sleep with your head raised (elevated).  Make sure you get enough sleep each night. General instructions   Put a warm, moist washcloth on your face 3-4 times a day, or as often as told by your doctor. This will help with discomfort.  Wash your hands often with soap and water. If there is no soap and water, use hand  sanitizer.  Do not smoke. Avoid being around people who are smoking (secondhand smoke).  Keep all follow-up visits as told by your doctor. This is important. Contact a doctor if:  You have a fever.  Your symptoms get worse.  Your symptoms do not get better within 10 days. Get help right away if:  You have a very bad headache.  You cannot stop throwing up (vomiting).  You have very bad pain or swelling around your face or eyes.  You have trouble seeing.  You feel confused.  Your neck is stiff.  You have trouble breathing. Summary  Sinusitis is swelling of your sinuses. Sinuses are hollow spaces in the bones around your face.  This condition is caused by tissues in your nose that become inflamed or swollen. This traps germs. These can lead to infection.  If you were prescribed an antibiotic medicine, take it as told by your doctor. Do not stop taking it even if you start to feel better.  Keep all follow-up visits as told by your doctor. This is important. This information is not intended to replace advice given to you by your health care provider. Make sure you discuss any questions you have with your health care provider. Document Released: 01/07/2008 Document Revised: 12/21/2017 Document Reviewed: 12/21/2017 Elsevier Interactive Patient Education  2019 Elsevier Inc.  Pharyngitis  Pharyngitis is a sore throat (pharynx). This is when there is redness, pain, and swelling in your throat. Most of the time, this condition gets better on its own. In some cases, you may need medicine. Follow these instructions at home:  Take over-the-counter and prescription medicines only as told by your doctor. ? If you were prescribed an antibiotic medicine, take it as told by your doctor. Do not stop taking the antibiotic even if you start to feel better. ? Do not give children aspirin. Aspirin has been linked to Reye syndrome.  Drink enough water and fluids to keep your pee (urine)  clear or pale yellow.  Get a lot of rest.  Rinse your mouth (gargle) with a salt-water mixture 3-4 times a day or as needed. To make a salt-water mixture, completely dissolve -1 tsp of salt in 1 cup of warm water.  If your doctor approves, you may use throat lozenges or sprays to soothe your throat. Contact a doctor if:  You have large, tender lumps in your neck.  You have a rash.  You cough up green, yellow-brown, or bloody spit. Get help right away if:  You have a stiff neck.  You drool or cannot swallow liquids.  You cannot drink or take medicines without throwing up.  You have very bad pain that does not go away with medicine.  You have problems breathing, and it is not from a stuffy nose.  You have new pain and swelling in your knees, ankles, wrists,  or elbows. Summary  Pharyngitis is a sore throat (pharynx). This is when there is redness, pain, and swelling in your throat.  If you were prescribed an antibiotic medicine, take it as told by your doctor. Do not stop taking the antibiotic even if you start to feel better.  Most of the time, pharyngitis gets better on its own. Sometimes, you may need medicine. This information is not intended to replace advice given to you by your health care provider. Make sure you discuss any questions you have with your health care provider. Document Released: 01/07/2008 Document Revised: 08/26/2016 Document Reviewed: 08/26/2016 Elsevier Interactive Patient Education  2019 [employer].

## 2018-08-12 NOTE — Progress Notes (Signed)
Subjective:     Patient ID: Anna Hoffman , female    DOB: 1934/12/14 , 84 y.o.   MRN: [id number]   Chief Complaint  Patient presents with  . Sore Throat    HPI  ST x 6 days. Has chills last night. Has had green PND. Has been dealing with a cold x 1 week. L side of throat is more sore and hurts to swallow. States she has been on [employer], and does not seem to be helping her any more. Zyrtec makes her sleepy, and Claritin does not work for her.   Past Medical History:  Diagnosis Date  . Diabetes mellitus without complication ([place])   . Hypertension   . Hypokalemia   . Stroke [facility])      Family History  Problem Relation Age of Onset  . Alzheimer's disease Mother   . Prostate cancer Father   . Brain cancer Brother   . Brain cancer Brother      Current Outpatient Medications:  .  acetaminophen (TYLENOL) 325 MG tablet, Take 2 tablets (650 mg total) by mouth every 6 (six) hours as needed for mild pain (or Fever >/= 101)., Disp: , Rfl:  .  amLODipine (NORVASC) 10 MG tablet, Take 10 mg by mouth daily., Disp: , Rfl: 2 .  Ascorbic Acid (VITAMIN C) 1000 MG tablet, Take 1,000 mg by mouth daily., Disp: , Rfl:  .  aspirin EC 81 MG tablet, Take 81 mg by mouth daily., Disp: , Rfl:  .  fexofenadine (ALLEGRA) 180 MG tablet, Take 180 mg by mouth daily., Disp: , Rfl:  .  irbesartan (AVAPRO) 150 MG tablet, Take 1 tablet (150 mg total) by mouth daily., Disp: 30 tablet, Rfl: 1 .  Multiple Vitamins-Minerals (STRESS TAB NF PO), Take 1 tablet by mouth daily., Disp: , Rfl:  .  omeprazole (PRILOSEC) 40 MG capsule, Take 1 capsule (40 mg total) by mouth daily., Disp: 30 capsule, Rfl: 1 .  ondansetron (ZOFRAN-ODT) 4 MG disintegrating tablet, Take 1 tablet (4 mg total) by mouth every 8 (eight) hours as needed for nausea or vomiting., Disp: 20 tablet, Rfl: 0 .  rosuvastatin (CRESTOR) 10 MG tablet, Take 1 tablet (10 mg total) by mouth daily. (Patient taking differently: Take 20 mg by mouth daily. ), Disp:  30 tablet, Rfl: 1 .  trolamine salicylate (ASPERCREME) 10 % cream, Apply 1 application topically as needed for muscle pain., Disp: , Rfl:  .  vitamin B-12 (CYANOCOBALAMIN) 1000 MCG tablet, Take 1,000 mcg by mouth daily., Disp: , Rfl:    Allergies  Allergen Reactions  . Amoxicillin Diarrhea    Has patient had a PCN reaction causing immediate rash, facial/tongue/throat swelling, SOB or lightheadedness with hypotension: no Has patient had a PCN reaction causing severe rash involving mucus membranes or skin necrosis: no Has patient had a PCN reaction that required hospitalization: no pharmacist consult Has patient had a PCN reaction occurring within the last 10 years: yes If all of the above answers are "NO", then may proceed with Cephalosporin use.   . Ampicillin Rash    Has patient had a PCN reaction causing immediate rash, facial/tongue/throat swelling, SOB or lightheadedness with hypotension: No Has patient had a PCN reaction causing severe rash involving mucus membranes or skin necrosis: No Has patient had a PCN reaction that required hospitalization No Has patient had a PCN reaction occurring within the last 10 years: No If all of the above answers are "NO", then may proceed with  Cephalosporin use.   . Sulfa Antibiotics Rash     Review of Systems  Constitutional: Positive for chills and fever. Negative for appetite change, diaphoresis and fatigue.       Thinks she had a fever last night  HENT: Positive for congestion, ear pain, postnasal drip, rhinorrhea, sore throat and trouble swallowing. Negative for ear discharge and mouth sores.   Eyes: Negative for discharge.  Respiratory: Positive for cough. Negative for chest tightness, shortness of breath and wheezing.   Cardiovascular: Negative for chest pain.  Gastrointestinal: Negative for nausea and vomiting.  Musculoskeletal: Negative for myalgias.  Skin: Negative for rash.  Neurological: Negative for headaches.     Today's Vitals    08/12/18 1500  BP: 122/62  Pulse: 100  Temp: 98.2 F (36.8 C)  TempSrc: Oral  SpO2: 96%  Weight: 144 lb 6.4 oz (65.5 kg)  Height: 5' 4.3" (1.633 m)   Body mass index is 24.56 kg/m.   Objective:  Physical Exam Vitals signs and nursing note reviewed.  Constitutional:      General: She is not in acute distress.    Appearance: She is well-developed. She is not toxic-appearing.  HENT:     Head: Normocephalic.     Right Ear: Tympanic membrane and ear canal normal. No drainage, swelling or tenderness. No middle ear effusion.     Left Ear: Tympanic membrane and ear canal normal. No drainage, swelling or tenderness.  No middle ear effusion.     Nose: Rhinorrhea present. No congestion.     Comments: Has tenderness on both maxillary sinuses and ethmoids    Mouth/Throat:     Mouth: Mucous membranes are moist. No oral lesions.     Pharynx: Uvula midline. Posterior oropharyngeal erythema present. No pharyngeal swelling, oropharyngeal exudate or uvula swelling.     Comments: Has slightly swollen and erythematous L pharynx Eyes:     Conjunctiva/sclera: Conjunctivae normal.  Neck:     Musculoskeletal: Neck supple.  Cardiovascular:     Rate and Rhythm: Normal rate and regular rhythm.     Heart sounds: No murmur.  Pulmonary:     Effort: Pulmonary effort is normal.     Breath sounds: Normal breath sounds.  Lymphadenopathy:     Cervical: No cervical adenopathy.  Skin:    General: Skin is warm and dry.     Findings: No rash.  Neurological:     Mental Status: She is alert and oriented to person, place, and time.  Psychiatric:        Mood and Affect: Mood normal.        Behavior: Behavior normal.     Assessment And Plan:   1. Pharyngitis, unspecified etiology- acute and seems to be from purulent post nasal drainage.   2. Sinusitis, unspecified chronicity, unspecified location- I placed her on Zithromax 500 mg qd x 5 days. Advised to continue saline rinses. D/c antihistamine for 5  days since this makes the secretions thicker, then may start the Clarinix 3- Allergic rhinitis- not responding to allegra, I switched her to Clarinex.      Jillann Charette RODRIGUEZ-SOUTHWORTH, PA-C

## 2018-09-01 ENCOUNTER — Other Ambulatory Visit: Payer: Self-pay

## 2018-09-01 ENCOUNTER — Emergency Department (HOSPITAL_COMMUNITY): Payer: Medicare Other

## 2018-09-01 ENCOUNTER — Encounter (HOSPITAL_COMMUNITY): Payer: Self-pay | Admitting: Emergency Medicine

## 2018-09-01 DIAGNOSIS — R05 Cough: Secondary | ICD-10-CM | POA: Diagnosis not present

## 2018-09-01 DIAGNOSIS — R1110 Vomiting, unspecified: Secondary | ICD-10-CM | POA: Insufficient documentation

## 2018-09-01 DIAGNOSIS — R079 Chest pain, unspecified: Secondary | ICD-10-CM | POA: Diagnosis not present

## 2018-09-01 LAB — CBC
HCT: 40.5 % (ref 36.0–46.0)
Hemoglobin: 12.8 g/dL (ref 12.0–15.0)
MCH: 29.4 pg (ref 26.0–34.0)
MCHC: 31.6 g/dL (ref 30.0–36.0)
MCV: 93.1 fL (ref 80.0–100.0)
Platelets: 340 10*3/uL (ref 150–400)
RBC: 4.35 MIL/uL (ref 3.87–5.11)
RDW: 13.5 % (ref 11.5–15.5)
WBC: 8.5 10*3/uL (ref 4.0–10.5)
nRBC: 0 % (ref 0.0–0.2)

## 2018-09-01 LAB — POCT I-STAT TROPONIN I: Troponin i, poc: 0 ng/mL (ref 0.00–0.08)

## 2018-09-01 LAB — BASIC METABOLIC PANEL
Anion gap: 9 (ref 5–15)
BUN: 19 mg/dL (ref 8–23)
CO2: 19 mmol/L — ABNORMAL LOW (ref 22–32)
Calcium: 8.9 mg/dL (ref 8.9–10.3)
Chloride: 109 mmol/L (ref 98–111)
Creatinine, Ser: 0.9 mg/dL (ref 0.44–1.00)
GFR calc Af Amer: >60 mL/min (ref >60)
GFR calc non Af Amer: 60 mL/min — ABNORMAL LOW (ref >60)
Glucose, Bld: 112 mg/dL — ABNORMAL HIGH (ref 70–99)
Potassium: 4 mmol/L (ref 3.5–5.1)
Sodium: 137 mmol/L (ref 135–145)

## 2018-09-01 MED ORDER — SODIUM CHLORIDE 0.9% FLUSH: 3.0000 mL | Freq: Once | INTRAVENOUS | Status: DC

## 2018-09-01 NOTE — ED Triage Notes (Signed)
Report constant right sided chest pain which began Monday.  Reports cough since Monday.  Reports furnace has been out for several weeks and it has been cold in her house.  Denies shortness of breath. States vomiting mucus this morning.

## 2018-09-01 NOTE — ED Notes (Signed)
I called patient name in lobby to recollect vitals and no one answered

## 2018-09-02 ENCOUNTER — Other Ambulatory Visit: Payer: Self-pay

## 2018-09-02 ENCOUNTER — Ambulatory Visit (INDEPENDENT_AMBULATORY_CARE_PROVIDER_SITE_OTHER): Payer: Medicare Other | Admitting: Internal Medicine

## 2018-09-02 ENCOUNTER — Emergency Department (HOSPITAL_COMMUNITY)
Admission: EM | Admit: 2018-09-02 | Discharge: 2018-09-02 | Disposition: A | Discharge status: Home / Self Care | Payer: Medicare Other | Attending: Emergency Medicine | Admitting: Emergency Medicine

## 2018-09-02 ENCOUNTER — Encounter: Payer: Self-pay | Admitting: Internal Medicine

## 2018-09-02 VITALS — BP 132/76 | HR 96 | Temp 97.4°F | Ht 66.8 in | Wt 135.8 lb

## 2018-09-02 DIAGNOSIS — J069 Acute upper respiratory infection, unspecified: Secondary | ICD-10-CM

## 2018-09-02 DIAGNOSIS — Z712 Person consulting for explanation of examination or test findings: Secondary | ICD-10-CM

## 2018-09-02 DIAGNOSIS — K529 Noninfective gastroenteritis and colitis, unspecified: Secondary | ICD-10-CM | POA: Diagnosis not present

## 2018-09-02 DIAGNOSIS — R05 Cough: Secondary | ICD-10-CM | POA: Diagnosis not present

## 2018-09-02 DIAGNOSIS — I10 Essential (primary) hypertension: Secondary | ICD-10-CM

## 2018-09-02 DIAGNOSIS — Z09 Encounter for follow-up examination after completed treatment for conditions other than malignant neoplasm: Secondary | ICD-10-CM

## 2018-09-02 NOTE — ED Notes (Signed)
I called patient name in the lobby to collect vitals and no one responded

## 2018-09-02 NOTE — Progress Notes (Signed)
Subjective:     Patient ID: Anna Hoffman , female    DOB: 1934-12-02 , 84 y.o.   MRN: [id number]   Chief Complaint  Patient presents with  . Nausea    vomiting/ diarrhea/ headache/ weakness    HPI  Pt present today with HA, rhinitis, cough 5 d , N/V/D x 3 days. She went to ER last night and had labs and Xray and was sent to wait in the lobby, but she left without finding out her results.  Vomited x 2 yesterday, and diarrhea x 2 yesterday Today she only vomited x 1 and had diarrhea x 2. Today she took the dose of phenergan and is feeling better.  No blood in the stool.  She started with cough. Has  Been runing a kerosene heater since her furnace broke down. She has been drinking plenty of fluids today.   She took her BP med this am. Her glucose this am was 99.  She states she ate spaghetti which she made fresh that day and this is the second time she gets diarrhea from eating this.   Past Medical History:  Diagnosis Date  . Diabetes mellitus without complication ([place])   . Hypertension   . Hypokalemia   . Stroke [facility])      Family History  Problem Relation Age of Onset  . Alzheimer's disease Mother   . Prostate cancer Father   . Brain cancer Brother   . Brain cancer Brother      Current Outpatient Medications:  .  acetaminophen (TYLENOL) 325 MG tablet, Take 2 tablets (650 mg total) by mouth every 6 (six) hours as needed for mild pain (or Fever >/= 101)., Disp: , Rfl:  .  amLODipine (NORVASC) 10 MG tablet, Take 10 mg by mouth daily., Disp: , Rfl: 2 .  Ascorbic Acid (VITAMIN C) 1000 MG tablet, Take 1,000 mg by mouth daily., Disp: , Rfl:  .  aspirin EC 81 MG tablet, Take 81 mg by mouth daily., Disp: , Rfl:  .  desloratadine (CLARINEX) 5 MG tablet, Take 1 tablet (5 mg total) by mouth daily., Disp: 90 tablet, Rfl: 0 .  fexofenadine (ALLEGRA) 180 MG tablet, Take 180 mg by mouth daily., Disp: , Rfl:  .  irbesartan (AVAPRO) 150 MG tablet, Take 1 tablet (150 mg total) by mouth  daily., Disp: 30 tablet, Rfl: 1 .  Multiple Vitamins-Minerals (STRESS TAB NF PO), Take 1 tablet by mouth daily., Disp: , Rfl:  .  omeprazole (PRILOSEC) 40 MG capsule, Take 1 capsule (40 mg total) by mouth daily., Disp: 30 capsule, Rfl: 1 .  ondansetron (ZOFRAN-ODT) 4 MG disintegrating tablet, Take 1 tablet (4 mg total) by mouth every 8 (eight) hours as needed for nausea or vomiting., Disp: 20 tablet, Rfl: 0 .  rosuvastatin (CRESTOR) 10 MG tablet, Take 1 tablet (10 mg total) by mouth daily. (Patient taking differently: Take 20 mg by mouth daily. ), Disp: 30 tablet, Rfl: 1 .  trolamine salicylate (ASPERCREME) 10 % cream, Apply 1 application topically as needed for muscle pain., Disp: , Rfl:  .  vitamin B-12 (CYANOCOBALAMIN) 1000 MCG tablet, Take 1,000 mcg by mouth daily., Disp: , Rfl:    Allergies  Allergen Reactions  . Amoxicillin Diarrhea    Has patient had a PCN reaction causing immediate rash, facial/tongue/throat swelling, SOB or lightheadedness with hypotension: no Has patient had a PCN reaction causing severe rash involving mucus membranes or skin necrosis: no Has patient had a PCN  reaction that required hospitalization: no pharmacist consult Has patient had a PCN reaction occurring within the last 10 years: yes If all of the above answers are "NO", then may proceed with Cephalosporin use.   . Ampicillin Rash    Has patient had a PCN reaction causing immediate rash, facial/tongue/throat swelling, SOB or lightheadedness with hypotension: No Has patient had a PCN reaction causing severe rash involving mucus membranes or skin necrosis: No Has patient had a PCN reaction that required hospitalization No Has patient had a PCN reaction occurring within the last 10 years: No If all of the above answers are "NO", then may proceed with Cephalosporin use.   . Sulfa Antibiotics Rash     Review of Systems  Constitutional: Positive for appetite change, chills, diaphoresis and fatigue.       Only  the first day.   HENT: Positive for postnasal drip and rhinorrhea. Negative for congestion, ear discharge, ear pain and sore throat.   Eyes: Negative for discharge.  Respiratory: Positive for cough. Negative for chest tightness and shortness of breath.   Cardiovascular: Negative for chest pain and palpitations.  Gastrointestinal: Positive for diarrhea, nausea and vomiting. Negative for abdominal distention, abdominal pain and blood in stool.  Endocrine: Negative for polydipsia.  Genitourinary: Negative for difficulty urinating.  Musculoskeletal: Negative for gait problem and myalgias.  Skin: Negative for rash.  Neurological: Positive for weakness. Negative for dizziness, tremors, syncope, light-headedness and headaches.     Today's Vitals   09/02/18 1422  BP: 118/68  Pulse: 93  Temp: (!) 97.4 F (36.3 C)  TempSrc: Oral  SpO2: 92%  Weight: 135 lb 12.8 oz (61.6 kg)  Height: 5' 6.8" (1.697 m)   Body mass index is 21.4 kg/m.   Objective:  Physical Exam   Constitutional: She is oriented to person, place, and time. She appears well-developed and well-nourished. No distress.  HENT: TM's gray and shiny. Throat is clear. Nose with mild clear mucous.  Head: Normocephalic and atraumatic.  Right Ear: External ear normal.  Left Ear: External ear normal.  Nose: Nose normal.  Eyes: Conjunctivae are normal. Right eye exhibits no discharge. Left eye exhibits no discharge. No scleral icterus.  Neck: Neck supple. No thyromegaly present.  No carotid bruits bilaterally  Cardiovascular: Normal rate and regular rhythm.  No murmur heard. Pulmonary/Chest: Effort normal and breath sounds normal. No respiratory distress.  Musculoskeletal: Normal range of motion.  Lymphadenopathy: She has no cervical adenopathy.  Neurological: She is alert and oriented to person, place, and time.  Skin: Skin is warm and dry. Capillary refill takes less than 2 seconds. No rash noted. She is not diaphoretic.   Psychiatric: She has a normal mood and affect. Her behavior is normal. Judgment and thought content normal.  Nursing note reviewed.  Flu test is negative for A&B Assessment And Plan:  1. Gastroenteritis- BRAT diet, may continue Phenergan prn. She is not orthostatic right now, so she can continue hydration at home.      Was told to hold off from taking her BP med for a few days, see instructions.  2. Viral upper respiratory tract infection- may do saline rinses prn of nose congestion.   I reviewed her CXR and labs from last night and were neg. If she gets worse, needs to be seen again.     Susanne Baumgarner RODRIGUEZ-SOUTHWORTH, PA-C

## 2018-09-02 NOTE — Patient Instructions (Addendum)
YOUR FLU TEST IS NEGATIVE  THE BLOOD WORK FROM LAST NIGHT AND CHEST XRAY IS NORMAL  YOU HAVE A COLD AND STOMACH VITUS. Your  FLU TEST IS NEGATIVE.   CONTINUE TAKING THE PHENERGAN FOR NAUSEA AND STAY ON BLAND DIET LIKE BANANAS, POTATOES, RICE, CHICKEN BROTH FOR 48H AT WHICH TIME YOU SHOULD BE BETTER.  YOU NEED TO DRINK AT LEAST 2 LITERS OF FLUIDS A DAY.   DO NOT TAKE YOUR BLOOD PRESSURE MEDICINE IF YOUR BLOOD PRESSURE IS LESS THAN 140/90, GET BACK ON IT IF IT GOES TO THIS NUMBER OR HIGHER.  IF YOU GET WORSE IN 24 GO TO ER.   Viral Gastroenteritis, Adult  Viral gastroenteritis is also known as the stomach flu. This condition is caused by certain germs (viruses). These germs can be passed from person to person very easily (are very  Upper Respiratory Infection, Adult An upper respiratory infection (URI) affects the nose, throat, and upper air passages. URIs are caused by germs (viruses). The most common type of URI is often called "the common cold." Medicines cannot cure URIs, but you can do things at home to relieve your symptoms. URIs usually get better within 7-10 days. Follow these instructions at home: Activity  Rest as needed.  If you have a fever, stay home from work or school until your fever is gone, or until your doctor says you may return to work or school. ? You should stay home until you cannot spread the infection anymore (you are not contagious). ? Your doctor may have you wear a face mask so you have less risk of spreading the infection. Relieving symptoms  Gargle with a salt-water mixture 3-4 times a day or as needed. To make a salt-water mixture, completely dissolve -1 tsp of salt in 1 cup of warm water.  Use a cool-mist humidifier to add moisture to the air. This can help you breathe more easily. Eating and drinking   Drink enough fluid to keep your pee (urine) pale yellow.  Eat soups and other clear broths. General instructions   Take over-the-counter and  prescription medicines only as told by your doctor. These include cold medicines, fever reducers, and cough suppressants.  Do not use any products that contain nicotine or tobacco. These include cigarettes and e-cigarettes. If you need help quitting, ask your doctor.  Avoid being where people are smoking (avoid secondhand smoke).  Make sure you get regular shots and get the flu shot every year.  Keep all follow-up visits as told by your doctor. This is important. How to avoid spreading infection to others   Wash your hands often with soap and water. If you do not have soap and water, use hand sanitizer.  Avoid touching your mouth, face, eyes, or nose.  Cough or sneeze into a tissue or your sleeve or elbow. Do not cough or sneeze into your hand or into the air. Contact a doctor if:  You are getting worse, not better.  You have any of these: ? A fever. ? Chills. ? Brown or red mucus in your nose. ? Yellow or brown fluid (discharge)coming from your nose. ? Pain in your face, especially when you bend forward. ? Swollen neck glands. ? Pain with swallowing. ? White areas in the back of your throat. Get help right away if:  You have shortness of breath that gets worse.  You have very bad or constant: ? Headache. ? Ear pain. ? Pain in your forehead, behind your eyes, and over  your cheekbones (sinus pain). ? Chest pain.  You have long-lasting (chronic) lung disease along with any of these: ? Wheezing. ? Long-lasting cough. ? Coughing up blood. ? A change in your usual mucus.  You have a stiff neck.  You have changes in your: ? Vision. ? Hearing. ? Thinking. ? Mood. Summary  An upper respiratory infection (URI) is caused by a germ called a virus. The most common type of URI is often called "the common cold."  URIs usually get better within 7-10 days.  Take over-the-counter and prescription medicines only as told by your doctor. This information is not intended to  replace advice given to you by your health care provider. Make sure you discuss any questions you have with your health care provider. Document Released: 01/07/2008 Document Revised: 03/13/2017 Document Reviewed: 03/13/2017 Elsevier Interactive Patient Education  2019 [employer]. contagious). This condition can cause sudden watery poop (diarrhea), fever, and throwing up (vomiting). Having watery poop and throwing up can make you feel weak and cause you to get dehydrated. Dehydration can make you tired and thirsty, make you have a dry mouth, and make it so you pee (urinate) less often. Older adults and people with other diseases or a weak defense system (immune system) are at higher risk for dehydration. It is important to replace the fluids that you lose from having watery poop and throwing up. Follow these instructions at home: Follow instructions from your doctor about how to care for yourself at home. Eating and drinking Follow these instructions as told by your doctor:  Take an oral rehydration solution (ORS). This is a drink that is sold at pharmacies and stores.  Drink clear fluids in small amounts as you are able, such as: ? Water. ? Ice chips. ? Diluted fruit juice. ? Low-calorie sports drinks.  Eat bland, easy-to-digest foods in small amounts as you are able, such as: ? Bananas. ? Applesauce. ? Rice. ? Low-fat (lean) meats. ? Toast. ? Crackers.  Avoid fluids that have a lot of sugar or caffeine in them.  Avoid alcohol.  Avoid spicy or fatty foods. General instructions   Drink enough fluid to keep your pee (urine) clear or pale yellow.  Wash your hands often. If you cannot use soap and water, use hand sanitizer.  Make sure that all people in your home wash their hands well and often.  Rest at home while you get better.  Take over-the-counter and prescription medicines only as told by your doctor.  Watch your condition for any changes.  Take a warm bath to help  with any burning or pain from having watery poop.  Keep all follow-up visits as told by your doctor. This is important. Contact a doctor if:  You cannot keep fluids down.  Your symptoms get worse.  You have new symptoms.  You feel light-headed or dizzy.  You have muscle cramps. Get help right away if:  You have chest pain.  You feel very weak or you pass out (faint).  You see blood in your throw-up.  Your throw-up looks like coffee grounds.  You have bloody or black poop (stools) or poop that look like tar.  You have a very bad headache, a stiff neck, or both.  You have a rash.  You have very bad pain, cramping, or bloating in your belly (abdomen).  You have trouble breathing.  You are breathing very quickly.  Your heart is beating very quickly.  Your skin feels cold and clammy.  You feel confused.  You have pain when you pee.  You have signs of dehydration, such as: ? Dark pee, hardly any pee, or no pee. ? Cracked lips. ? Dry mouth. ? Sunken eyes. ? Sleepiness. ? Weakness. This information is not intended to replace advice given to you by your health care provider. Make sure you discuss any questions you have with your health care provider. Document Released: 01/07/2008 Document Revised: 04/14/2018 Document Reviewed: 03/27/2015 Elsevier Interactive Patient Education  2019 [employer].

## 2018-09-06 ENCOUNTER — Other Ambulatory Visit: Payer: Self-pay | Admitting: Internal Medicine

## 2018-09-08 MED FILL — IRBESARTAN 150 MG TAB: 150 | 90 days supply | Qty: 90 | Fill #0

## 2018-10-06 ENCOUNTER — Ambulatory Visit: Payer: Medicare Other | Admitting: Allergy

## 2018-10-06 ENCOUNTER — Encounter: Payer: Self-pay | Admitting: Allergy

## 2018-10-06 VITALS — BP 128/66 | HR 100 | Temp 97.6°F | Resp 16 | Ht 63.0 in | Wt 142.0 lb

## 2018-10-06 DIAGNOSIS — H1013 Acute atopic conjunctivitis, bilateral: Secondary | ICD-10-CM | POA: Diagnosis not present

## 2018-10-06 DIAGNOSIS — J3089 Other allergic rhinitis: Secondary | ICD-10-CM

## 2018-10-06 MED ORDER — OLOPATADINE HCL 0.7 % OP SOLN: 1.0000 [drp] | Freq: Every day | OPHTHALMIC | 5 refills | Status: DC

## 2018-10-06 MED ORDER — AZELASTINE HCL 0.1 % NA SOLN: 2.0000 | Freq: Two times a day (BID) | NASAL | 5 refills | Status: DC

## 2018-10-06 MED FILL — AZELASTINE HCL 137 MCG SPRY: 0.1 | 25 days supply | Qty: 30 | Fill #0

## 2018-10-06 MED FILL — PAZEO 0.7% EYE DROPS: 0.7 | 25 days supply | Qty: 3 | Fill #0

## 2018-10-06 NOTE — Progress Notes (Signed)
New Patient Note  RE: Anna Hoffman MRN: [id number] DOB: 09-19-1934 Date of Office Visit: 10/06/2018  Referring provider: Glendale Chard, MD Primary care provider: Glendale Chard, MD  Chief Complaint: Allergies are acting up  History of present illness: Anna Hoffman is a 84 y.o. female presenting today for consultation for allergies.  Her PCP Dr. Baird Cancer requested this referral.  She states she has been sick for over a month with allergy symptoms.  Symptoms include cough, sneezing, nasal congestion and drainage and itchy eyes mostly. She states there have been many factors that are contributing to the symptoms.  About a month ago she states her furnace run out and it took about 2 weeks for the heating company to fix it.  In the meantime she states she was using a kerosene heater to warm the home.  She states that someone told her that she was supposed to use the kerosene heater with a window open and that she should have put "water on top "of the heater.  She says she did not do either of those things and feels that the kerosene exposure may be contributing to her symptoms.  She also states that 2 weeks ago at church she was exposed to someone that was sick and thus she is not sure if her cough and congestion is related to that exposure.  She is performing saline rinse twice a day.  She takes Allegra daily and states it helps some.  She states flonase use in the past causes her nose to bleed.   She states claritin causes her BP to go up.  She has used clarinex as well but states it stopped working.   She had allergy testing done by Dr. Velora Heckler many years ago and states she did undergo allergy shots at that time.    No history of asthma, eczema or food allergy.     Review of systems: Review of Systems  Constitutional: Negative for chills, fever and malaise/fatigue.  HENT: Positive for congestion. Negative for ear discharge, ear pain, nosebleeds, sinus pain and sore throat.   Eyes:  Negative for pain, discharge and redness.  Respiratory: Positive for cough. Negative for sputum production, shortness of breath and wheezing.   Cardiovascular: Negative for chest pain.  Gastrointestinal: Negative for abdominal pain, constipation, diarrhea, heartburn, nausea and vomiting.  Musculoskeletal: Negative for joint pain.  Skin: Negative for itching and rash.  Neurological: Negative for headaches.    All other systems negative unless noted above in HPI  Past medical history: Past Medical History:  Diagnosis Date  . Diabetes mellitus without complication ([place])   . Hypertension   . Hypokalemia   . Stroke [facility])     Past surgical history: Past Surgical History:  Procedure Laterality Date  . ABDOMINAL HYSTERECTOMY    . BREAST EXCISIONAL BIOPSY Left   . ESOPHAGOGASTRODUODENOSCOPY N/A 09/01/2014   Procedure: ESOPHAGOGASTRODUODENOSCOPY (EGD);  Surgeon: Lear Ng, MD;  Location: [facility];  Service: Endoscopy;  Laterality: N/A;  . HIATAL HERNIA REPAIR N/A 09/04/2014   Procedure: LAPAROSCOPIC REPAIR OF HIATAL HERNIA;  Surgeon: Excell Seltzer, MD;  Location: [facility];  Service: General;  Laterality: N/A;  With MESH  . IR GENERIC HISTORICAL  04/10/2016   IR US GUIDE VASC ACCESS RIGHT 04/10/2016 Corrie Mckusick, DO [facility]  . IR GENERIC HISTORICAL  04/10/2016   IR ANGIOGRAM SELECTIVE EACH ADDITIONAL VESSEL 04/10/2016 Corrie Mckusick, DO [facility]  . IR GENERIC HISTORICAL  04/10/2016   IR ANGIOGRAM  SELECTIVE EACH ADDITIONAL VESSEL 04/10/2016 Corrie Mckusick, DO [facility]  . IR GENERIC HISTORICAL  04/10/2016   IR EMBO TUMOR ORGAN ISCHEMIA INFARCT INC GUIDE ROADMAPPING 04/10/2016 Corrie Mckusick, DO [facility]  . IR GENERIC HISTORICAL  04/10/2016   IR ANGIOGRAM SELECTIVE EACH ADDITIONAL VESSEL 04/10/2016 Corrie Mckusick, DO [facility]  . IR GENERIC HISTORICAL  04/10/2016   IR ANGIOGRAM SELECTIVE EACH ADDITIONAL VESSEL 04/10/2016 Corrie Mckusick, DO [facility]  . IR GENERIC HISTORICAL   04/10/2016   IR RENAL SELECTIVE  UNI INC S&I MOD SED 04/10/2016 Corrie Mckusick, DO [facility]  . IR GENERIC HISTORICAL  03/06/2016   IR RADIOLOGIST EVAL & MGMT 03/06/2016 Corrie Mckusick, DO [facility]  . IR GENERIC HISTORICAL  03/26/2016   IR RADIOLOGIST EVAL & MGMT 03/26/2016 Corrie Mckusick, DO [facility]  . IR GENERIC HISTORICAL  04/29/2016   IR RADIOLOGIST EVAL & MGMT 04/29/2016 [facility]  . IR RADIOLOGIST EVAL & MGMT  11/12/2016  . IR RADIOLOGIST EVAL & MGMT  12/16/2017  . KNEE SURGERY    . TONSILLECTOMY      Family history:  Family History  Problem Relation Age of Onset  . Alzheimer's disease Mother   . Prostate cancer Father   . Brain cancer Brother   . Brain cancer Brother   . Allergic rhinitis Neg Hx   . Asthma Neg Hx   . Eczema Neg Hx   . Urticaria Neg Hx     Social history: She lives in a home with carpeting with electric heating and central cooling.  There are no pets in the home.  There are cats outside the home.  There is concern for water damage/mildew and roaches in the home.  She denies a smoking history.  Medication List: Allergies as of 10/06/2018      Reactions   Amoxicillin Diarrhea   Has patient had a PCN reaction causing immediate rash, facial/tongue/throat swelling, SOB or lightheadedness with hypotension: no Has patient had a PCN reaction causing severe rash involving mucus membranes or skin necrosis: no Has patient had a PCN reaction that required hospitalization: no pharmacist consult Has patient had a PCN reaction occurring within the last 10 years: yes If all of the above answers are "NO", then may proceed with Cephalosporin use.   Ampicillin Rash   Has patient had a PCN reaction causing immediate rash, facial/tongue/throat swelling, SOB or lightheadedness with hypotension: No Has patient had a PCN reaction causing severe rash involving mucus membranes or skin necrosis: No Has patient had a PCN reaction that required hospitalization No Has  patient had a PCN reaction occurring within the last 10 years: No If all of the above answers are "NO", then may proceed with Cephalosporin use.   Sulfa Antibiotics Rash      Medication List       Accurate as of October 06, 2018 12:20 PM. Always use your most recent med list.        acetaminophen 325 MG tablet Commonly known as:  TYLENOL Take 2 tablets (650 mg total) by mouth every 6 (six) hours as needed for mild pain (or Fever >/= 101).   amLODipine 10 MG tablet Commonly known as:  NORVASC Take 10 mg by mouth daily.   aspirin EC 81 MG tablet Take 81 mg by mouth daily.   desloratadine 5 MG tablet Commonly known as:  CLARINEX Take 1 tablet (5 mg total) by mouth daily.   fexofenadine 180 MG tablet Commonly known  as:  ALLEGRA Take 180 mg by mouth daily.   irbesartan 150 MG tablet Commonly known as:  AVAPRO TAKE 1 TABLET BY MOUTH ONCE DAILY   omeprazole 40 MG capsule Commonly known as:  PRILOSEC Take 1 capsule (40 mg total) by mouth daily.   ondansetron 4 MG disintegrating tablet Commonly known as:  ZOFRAN-ODT Take 1 tablet (4 mg total) by mouth every 8 (eight) hours as needed for nausea or vomiting.   rosuvastatin 20 MG tablet Commonly known as:  CRESTOR   STRESS TAB NF PO Take 1 tablet by mouth daily.   trolamine salicylate 10 % cream Commonly known as:  ASPERCREME Apply 1 application topically as needed for muscle pain.   vitamin B-12 1000 MCG tablet Commonly known as:  CYANOCOBALAMIN Take 1,000 mcg by mouth daily.   vitamin C 1000 MG tablet Take 1,000 mg by mouth daily.       Known medication allergies: Allergies  Allergen Reactions  . Amoxicillin Diarrhea    Has patient had a PCN reaction causing immediate rash, facial/tongue/throat swelling, SOB or lightheadedness with hypotension: no Has patient had a PCN reaction causing severe rash involving mucus membranes or skin necrosis: no Has patient had a PCN reaction that required hospitalization: no  pharmacist consult Has patient had a PCN reaction occurring within the last 10 years: yes If all of the above answers are "NO", then may proceed with Cephalosporin use.   . Ampicillin Rash    Has patient had a PCN reaction causing immediate rash, facial/tongue/throat swelling, SOB or lightheadedness with hypotension: No Has patient had a PCN reaction causing severe rash involving mucus membranes or skin necrosis: No Has patient had a PCN reaction that required hospitalization No Has patient had a PCN reaction occurring within the last 10 years: No If all of the above answers are "NO", then may proceed with Cephalosporin use.   . Sulfa Antibiotics Rash     Physical examination: Blood pressure 128/66, pulse 100, temperature 97.6 F (36.4 C), temperature source Oral, resp. rate 16, height 5\' 3"  (1.6 m), weight 142 lb (64.4 kg).  General: Alert, interactive, in no acute distress. HEENT: PERRLA, TMs pearly gray, turbinates moderately edematous with clear discharge, post-pharynx non erythematous. Neck: Supple without lymphadenopathy. Lungs: Clear to auscultation without wheezing, rhonchi or rales. {no increased work of breathing. CV: Normal S1, S2 without murmurs. Abdomen: Nondistended, nontender. Skin: Warm and dry, without lesions or rashes. Extremities:  No clubbing, cyanosis or edema. Neuro:   Grossly intact.  Diagnositics/Labs: Allergy testing: Environmental allergy skin prick testing is positive to meadow fescue, birch, pullulara, dust mite (d. Pter), dog. Allergy testing results were read and interpreted by provider, documented by clinical staff.   Assessment and plan:   Allergic rhinitis with conjunctivitis  - environmental allergy skin testing today is positive to grass pollen (meadow fescue), tree pollen (birch), mold (pullulara), dust mite and dog  - avoidance measures discussed/handouts provided  - try long-acting antihistamine like Allegra 180mg , Xyzal 5mg  or Zyrtec 10mg   daily as needed  - continue performing nasal saline rinse 1-2 times a day to help flush out sinuses  - for nasal drainage use Astelin 2 sprays each nostril twice a day as needed  - for nasal congestion use OTC Nasacort or Rhinocort 2 sprays each nostril 3 times a week (M,W,F) to help decrease risk of nosebleeds  - for itchy/watery/red eyes use Pazeo 1 drop each eye daily as needed  - continue good hand hygiene with soap  and water, scrub hands together at least 20 seconds  Follow-up 3-4 months   I appreciate the opportunity to take part in Anna Hoffman's care. Please do not hesitate to contact me with questions.  Sincerely,   Prudy Feeler, MD Allergy/Immunology [facility]

## 2018-10-06 NOTE — Patient Instructions (Addendum)
Allergies  - environmental allergy skin testing today is positive to grass pollen (meadow fescue), tree pollen (b[place]), mold (pullulara), dust mite and dog  - avoidance measures discussed/handouts provided  - try long-acting antihistamine like Allegra 180mg , Xyzal 5mg  or Zyrtec 10mg  daily as needed  - continue performing nasal saline rinse 1-2 times a day to help flush out sinuses  - for nasal drainage use Astelin 2 sprays each nostril twice a day as needed  - for nasal congestion use OTC Nasacort or Rhinocort 2 sprays each nostril 3 times a week (M,W,F) to help decrease risk of nosebleeds  - for itchy/watery/red eyes use Pazeo 1 drop each eye daily as needed  - continue good hand hygiene with soap and water, scrub hands together at least 20 seconds  Follow-up 3-4 months

## 2018-10-25 ENCOUNTER — Other Ambulatory Visit: Payer: Self-pay

## 2018-10-25 MED ORDER — GLUCOSE BLOOD VI STRP: 1.0000 | ORAL_STRIP | 11 refills | Status: DC | PRN

## 2018-10-29 ENCOUNTER — Other Ambulatory Visit: Payer: Self-pay | Admitting: Internal Medicine

## 2018-10-29 MED FILL — OMEPRAZOLE 20 MG CPDR: 20 | 90 days supply | Qty: 90 | Fill #0

## 2018-10-29 MED FILL — AMLODIPINE BESYLATE 10 MG T: 10 | 90 days supply | Qty: 90 | Fill #0

## 2018-10-29 MED FILL — ONE TOUCH VERIO TEST STRIP: 50 days supply | Qty: 100 | Fill #0

## 2018-10-29 MED FILL — ROSUVASTATIN CALCIUM 20 MG: 20 | 90 days supply | Qty: 90 | Fill #0

## 2018-12-14 ENCOUNTER — Other Ambulatory Visit: Payer: Self-pay | Admitting: Internal Medicine

## 2018-12-20 DIAGNOSIS — H532 Diplopia: Secondary | ICD-10-CM | POA: Diagnosis not present

## 2018-12-20 DIAGNOSIS — E119 Type 2 diabetes mellitus without complications: Secondary | ICD-10-CM | POA: Diagnosis not present

## 2018-12-20 DIAGNOSIS — H04123 Dry eye syndrome of bilateral lacrimal glands: Secondary | ICD-10-CM | POA: Diagnosis not present

## 2018-12-20 DIAGNOSIS — Z961 Presence of intraocular lens: Secondary | ICD-10-CM | POA: Diagnosis not present

## 2018-12-20 DIAGNOSIS — H10413 Chronic giant papillary conjunctivitis, bilateral: Secondary | ICD-10-CM | POA: Diagnosis not present

## 2018-12-20 LAB — HM DIABETES EYE EXAM

## 2018-12-23 ENCOUNTER — Encounter: Payer: Self-pay | Admitting: Internal Medicine

## 2019-01-26 MED FILL — OMEPRAZOLE 20 MG CPDR: 20 | 90 days supply | Qty: 90 | Fill #0

## 2019-01-26 MED FILL — AMLODIPINE BESYLATE 10 MG T: 10 | 90 days supply | Qty: 90 | Fill #0

## 2019-01-26 MED FILL — ROSUVASTATIN CALCIUM 20 MG: 20 | 90 days supply | Qty: 90 | Fill #0

## 2019-01-26 MED FILL — ONE TOUCH VERIO TEST STRIP: 50 days supply | Qty: 100 | Fill #0

## 2019-02-07 ENCOUNTER — Encounter: Payer: Self-pay | Admitting: Internal Medicine

## 2019-02-07 ENCOUNTER — Ambulatory Visit (INDEPENDENT_AMBULATORY_CARE_PROVIDER_SITE_OTHER): Payer: Medicare Other | Admitting: Internal Medicine

## 2019-02-07 ENCOUNTER — Other Ambulatory Visit: Payer: Self-pay

## 2019-02-07 VITALS — BP 132/82 | HR 76 | Temp 98.0°F | Ht 63.0 in | Wt 139.0 lb

## 2019-02-07 DIAGNOSIS — B029 Zoster without complications: Secondary | ICD-10-CM

## 2019-02-07 DIAGNOSIS — R197 Diarrhea, unspecified: Secondary | ICD-10-CM

## 2019-02-07 DIAGNOSIS — N183 Chronic kidney disease, stage 3 unspecified: Secondary | ICD-10-CM

## 2019-02-07 DIAGNOSIS — I129 Hypertensive chronic kidney disease with stage 1 through stage 4 chronic kidney disease, or unspecified chronic kidney disease: Secondary | ICD-10-CM | POA: Diagnosis not present

## 2019-02-07 MED ORDER — VALACYCLOVIR HCL 1 G PO TABS: ORAL_TABLET | ORAL | 0 refills | Status: DC

## 2019-02-07 MED ORDER — VIBERZI 75 MG PO TABS: 75.0000 mg | ORAL_TABLET | Freq: Every day | ORAL | 0 refills | Status: DC

## 2019-02-07 MED FILL — valACYclovir HCL 1 GM TABS: 1 | 7 days supply | Qty: 21 | Fill #0

## 2019-02-07 NOTE — Progress Notes (Signed)
Virtual Visit via Video   This visit type was conducted due to national recommendations for restrictions regarding the COVID-19 Pandemic (e.g. social distancing) in an effort to limit this patient's exposure and mitigate transmission in our community.  Due to her co-morbid illnesses, this patient is at least at moderate risk for complications without adequate follow up.  This format is felt to be most appropriate for this patient at this time.  All issues noted in this document were discussed and addressed.  A limited physical exam was performed with this format.    This visit type was conducted due to national recommendations for restrictions regarding the COVID-19 Pandemic (e.g. social distancing) in an effort to limit this patient's exposure and mitigate transmission in our community.  Patients identity confirmed using two different identifiers.  This format is felt to be most appropriate for this patient at this time.  All issues noted in this document were discussed and addressed.  No physical exam was performed (except for noted visual exam findings with Video Visits).    Date:  02/07/2019   ID:  Anna Hoffman, DOB 1934-09-26, MRN [id number]  Patient Location:  Home, accompanied by her son  Provider location:   Office    Chief Complaint:  I have a rash on my back.   History of Present Illness:    Anna Hoffman is a 84 y.o. female who presents via video conferencing for a telehealth visit today.    The patient does not have symptoms concerning for COVID-19 infection (fever, chills, cough, or new shortness of breath).   She presents today for virtual visit. She prefers this method of contact due to COVID-19 pandemic.  She requested an appt b/c she wanted to be evaluated for a rash. She reports that she first noticed the rash this past Saturday. However, she reports itching started in the same area about a week ago. She denies previous h/o shingles. She has never had similar episodes  in the past. She reports that she does not feel "stressed".     Past Medical History:  Diagnosis Date  . Diabetes mellitus without complication ([place])   . Hypertension   . Hypokalemia   . Stroke [facility])    Past Surgical History:  Procedure Laterality Date  . ABDOMINAL HYSTERECTOMY    . BREAST EXCISIONAL BIOPSY Left   . ESOPHAGOGASTRODUODENOSCOPY N/A 09/01/2014   Procedure: ESOPHAGOGASTRODUODENOSCOPY (EGD);  Surgeon: Lear Ng, MD;  Location: [facility];  Service: Endoscopy;  Laterality: N/A;  . HIATAL HERNIA REPAIR N/A 09/04/2014   Procedure: LAPAROSCOPIC REPAIR OF HIATAL HERNIA;  Surgeon: Excell Seltzer, MD;  Location: [facility];  Service: General;  Laterality: N/A;  With MESH  . IR GENERIC HISTORICAL  04/10/2016   IR US GUIDE VASC ACCESS RIGHT 04/10/2016 Corrie Mckusick, DO [facility]  . IR GENERIC HISTORICAL  04/10/2016   IR ANGIOGRAM SELECTIVE EACH ADDITIONAL VESSEL 04/10/2016 Corrie Mckusick, DO [facility]  . IR GENERIC HISTORICAL  04/10/2016   IR ANGIOGRAM SELECTIVE EACH ADDITIONAL VESSEL 04/10/2016 Corrie Mckusick, DO [facility]  . IR GENERIC HISTORICAL  04/10/2016   IR EMBO TUMOR ORGAN ISCHEMIA INFARCT INC GUIDE ROADMAPPING 04/10/2016 Corrie Mckusick, DO [facility]  . IR GENERIC HISTORICAL  04/10/2016   IR ANGIOGRAM SELECTIVE EACH ADDITIONAL VESSEL 04/10/2016 Corrie Mckusick, DO [facility]  . IR GENERIC HISTORICAL  04/10/2016   IR ANGIOGRAM SELECTIVE EACH ADDITIONAL VESSEL 04/10/2016 Corrie Mckusick, DO [facility]  . IR GENERIC HISTORICAL  04/10/2016  IR RENAL SELECTIVE  UNI INC S&I MOD SED 04/10/2016 Corrie Mckusick, DO [facility]  . IR GENERIC HISTORICAL  03/06/2016   IR RADIOLOGIST EVAL & MGMT 03/06/2016 Corrie Mckusick, DO [facility]  . IR GENERIC HISTORICAL  03/26/2016   IR RADIOLOGIST EVAL & MGMT 03/26/2016 Corrie Mckusick, DO [facility]  . IR GENERIC HISTORICAL  04/29/2016   IR RADIOLOGIST EVAL & MGMT 04/29/2016 [facility]  . IR RADIOLOGIST EVAL & MGMT  11/12/2016  . IR  RADIOLOGIST EVAL & MGMT  12/16/2017  . KNEE SURGERY    . TONSILLECTOMY       Current Meds  Medication Sig  . acetaminophen (TYLENOL) 325 MG tablet Take 2 tablets (650 mg total) by mouth every 6 (six) hours as needed for mild pain (or Fever >/= 101).  Marland Kitchen amLODipine (NORVASC) 10 MG tablet TAKE 1 TABLET BY MOUTH ONCE DAILY  . Ascorbic Acid (VITAMIN C) 1000 MG tablet Take 1,000 mg by mouth daily.  Marland Kitchen aspirin EC 81 MG tablet Take 81 mg by mouth daily.  . cetirizine (ZYRTEC) 10 MG tablet Take 10 mg by mouth daily.  Marland Kitchen glucose blood (ONETOUCH VERIO) test strip 1 each by Other route as needed for other. Use as instructed to check blood pressure 2 times per day dx: e11.65  . irbesartan (AVAPRO) 150 MG tablet TAKE 1 TABLET BY MOUTH EVERY DAY.  Marland Kitchen omeprazole (PRILOSEC) 20 MG capsule TAKE 1 CAPSULE BY MOUTH ONCE DAILY 30 MINUTES TO 1 HOUR BEFORE A MEAL  . ondansetron (ZOFRAN-ODT) 4 MG disintegrating tablet Take 1 tablet (4 mg total) by mouth every 8 (eight) hours as needed for nausea or vomiting.  . rosuvastatin (CRESTOR) 20 MG tablet TAKE 1 TABLET BY MOUTH ONCE DAILY  . trolamine salicylate (ASPERCREME) 10 % cream Apply 1 application topically as needed for muscle pain.  . vitamin B-12 (CYANOCOBALAMIN) 1000 MCG tablet Take 1,000 mcg by mouth daily.     Allergies:   Amoxicillin, Ampicillin, and Sulfa antibiotics   Social History   Tobacco Use  . Smoking status: Never Smoker  . Smokeless tobacco: Never Used  Substance Use Topics  . Alcohol use: No  . Drug use: No     Family Hx: The patient's family history includes Alzheimer's disease in her mother; Brain cancer in her brother and brother; Prostate cancer in her father. There is no history of Allergic rhinitis, Asthma, Eczema, or Urticaria.  ROS:   Please see the history of present illness.    Review of Systems  Constitutional: Negative.   Respiratory: Negative.   Cardiovascular: Negative.   Gastrointestinal: Negative.   Neurological:  Negative.   Psychiatric/Behavioral: Negative.     All other systems reviewed and are negative.   Labs/Other Tests and Data Reviewed:    Recent Labs: 07/19/2018: ALT 24 09/01/2018: BUN 19; Creatinine, Ser 0.90; Hemoglobin 12.8; Platelets 340; Potassium 4.0; Sodium 137   Recent Lipid Panel Lab Results  Component Value Date/Time   CHOL 224 (H) 06/29/2016 03:30 AM   TRIG 180 (H) 06/29/2016 03:30 AM   HDL 64 06/29/2016 03:30 AM   CHOLHDL 3.5 06/29/2016 03:30 AM   LDLCALC 124 (H) 06/29/2016 03:30 AM    Wt Readings from Last 3 Encounters:  02/07/19 139 lb (63 kg)  10/06/18 142 lb (64.4 kg)  09/02/18 135 lb 12.8 oz (61.6 kg)     Exam:    Vital Signs:  BP 132/82 (BP Location: Left Arm, Patient Position: Sitting, Cuff Size:  Normal) Comment: pt provided  Pulse 76 Comment: pt provided  Temp 98 F (36.7 C) (Oral) Comment: pt provided  Ht 5\' 3"  (1.6 m)   Wt 139 lb (63 kg) Comment: pt provided  BMI 24.62 kg/m     Physical Exam  Constitutional: She is oriented to person, place, and time and well-developed, well-nourished, and in no distress.  HENT:  Head: Normocephalic and atraumatic.  Pulmonary/Chest: Effort normal.  Neurological: She is alert and oriented to person, place, and time.  Skin: Rash noted.  Healing rash on left upper back. Located on upper part of her left shoulder blade.  There are some erythematous areas, but few.   Psychiatric: Affect normal.  Nursing note and vitals reviewed.   ASSESSMENT & PLAN:     1. Herpes zoster without complication  I will send in rx valacyclovir 1gm to be taken tid x 7 days. She is encouraged to take the full course of medication.   2. Hypertensive nephropathy  Controlled. [facility]-NP evaluation was performed today. She will continue with current meds.   3. Chronic renal disease, stage III ([name])  Chronic. She is encouraged to stay well hydrated. I will recheck GFR, Cr at her next visit.   4. Diarrhea, unspecified type  Chronic.  She is in need of more Viberzi samples. She is not sure if her insurance will cover the medication. She does not take daily, only as needed.     COVID-19 Education: The signs and symptoms of COVID-19 were discussed with the patient and how to seek care for testing (follow up with PCP or arrange E-visit).  The importance of social distancing was discussed today.  Patient Risk:   After full review of this patients clinical status, I feel that they are at least moderate risk at this time.  Time:   Today, I have spent 9 minutes with the patient with telehealth technology discussing above diagnoses.     Medication Adjustments/Labs and Tests Ordered: Current medicines are reviewed at length with the patient today.  Concerns regarding medicines are outlined above.   Tests Ordered: No orders of the defined types were placed in this encounter.   Medication Changes: Meds ordered this encounter  Medications  . valACYclovir (VALTREX) 1000 MG tablet    Sig: One tab po tid x 7 days    Dispense:  21 tablet    Refill:  0  . Eluxadoline (VIBERZI) 75 MG TABS    Sig: Take 75 mg by mouth daily.    Dispense:  30 tablet    Refill:  0    Disposition:  Follow up prn  Signed, Maximino Greenland, MD

## 2019-02-07 NOTE — Patient Instructions (Signed)
Shingles  Shingles is an infection. It gives you a painful skin rash and blisters that have fluid in them. Shingles is caused by the same germ (virus) that causes chickenpox. Shingles only happens in people who:  Have had chickenpox.  Have been given a shot of medicine (vaccine) to protect against chickenpox. Shingles is rare in this group. The first symptoms of shingles may be itching, tingling, or pain in an area on your skin. A rash will show on your skin a few days or weeks later. The rash is likely to be on one side of your body. The rash usually has a shape like a belt or a band. Over time, the rash turns into fluid-filled blisters. The blisters will break open, change into scabs, and dry up. Medicines may:  Help with pain and itching.  Help you get better sooner.  Help to prevent long-term problems. Follow these instructions at home: Medicines  Take over-the-counter and prescription medicines only as told by your doctor.  Put on an anti-itch cream or numbing cream where you have a rash, blisters, or scabs. Do this as told by your doctor. Helping with itching and discomfort   Put cold, wet cloths (cold compresses) on the area of the rash or blisters as told by your doctor.  Cool baths can help you feel better. Try adding baking soda or dry oatmeal to the water to lessen itching. Do not bathe in hot water. Blister and rash care  Keep your rash covered with a loose bandage (dressing).  Wear loose clothing that does not rub on your rash.  Keep your rash and blisters clean. To do this, wash the area with mild soap and cool water as told by your doctor.  Check your rash every day for signs of infection. Check for: ? More redness, swelling, or pain. ? Fluid or blood. ? Warmth. ? Pus or a bad smell.  Do not scratch your rash. Do not pick at your blisters. To help you to not scratch: ? Keep your fingernails clean and cut short. ? Wear gloves or mittens when you sleep, if  scratching is a problem. General instructions  Rest as told by your doctor.  Keep all follow-up visits as told by your doctor. This is important.  Wash your hands often with soap and water. If soap and water are not available, use hand sanitizer. Doing this lowers your chance of getting a skin infection caused by germs (bacteria).  Your infection can cause chickenpox in people who have never had chickenpox or never got a shot of chickenpox vaccine. If you have blisters that did not change into scabs yet, try not to touch other people or be around other people, especially: ? Babies. ? Pregnant women. ? Children who have areas of red, itchy, or rough skin (eczema). ? Very old people who have transplants. ? People who have a long-term (chronic) sickness, like cancer or AIDS. Contact a doctor if:  Your pain does not get better with medicine.  Your pain does not get better after the rash heals.  You have any signs of infection in the rash area. These signs include: ? More redness, swelling, or pain around the rash. ? Fluid or blood coming from the rash. ? The rash area feeling warm to the touch. ? Pus or a bad smell coming from the rash. Get help right away if:  The rash is on your face or nose.  You have pain in your face or pain by   your eye.  You lose feeling on one side of your face.  You have trouble seeing.  You have ear pain, or you have ringing in your ear.  You have a loss of taste.  Your condition gets worse. Summary  Shingles gives you a painful skin rash and blisters that have fluid in them.  Shingles is an infection. It is caused by the same germ (virus) that causes chickenpox.  Keep your rash covered with a loose bandage (dressing). Wear loose clothing that does not rub on your rash.  If you have blisters that did not change into scabs yet, try not to touch other people or be around people. This information is not intended to replace advice given to you by  your health care provider. Make sure you discuss any questions you have with your health care provider. Document Released: 01/07/2008 Document Revised: 11/12/2018 Document Reviewed: 03/25/2017 Elsevier Patient Education  2020 [employer]  

## 2019-02-08 ENCOUNTER — Ambulatory Visit: Payer: Medicare Other | Admitting: Internal Medicine

## 2019-02-10 ENCOUNTER — Encounter: Payer: Self-pay | Admitting: Allergy

## 2019-02-10 ENCOUNTER — Other Ambulatory Visit: Payer: Self-pay

## 2019-02-10 ENCOUNTER — Ambulatory Visit: Payer: Medicare Other | Admitting: Allergy

## 2019-02-10 ENCOUNTER — Ambulatory Visit (INDEPENDENT_AMBULATORY_CARE_PROVIDER_SITE_OTHER): Payer: Medicare Other | Admitting: Allergy

## 2019-02-10 VITALS — BP 132/82 | HR 76 | Ht 60.0 in | Wt 139.0 lb

## 2019-02-10 DIAGNOSIS — J3089 Other allergic rhinitis: Secondary | ICD-10-CM

## 2019-02-10 DIAGNOSIS — H1013 Acute atopic conjunctivitis, bilateral: Secondary | ICD-10-CM

## 2019-02-10 NOTE — Patient Instructions (Addendum)
Allergies   - continue avoidance measures for grass pollen, tree pollen, mold, dust mite and dog  - believe her cough is more so related to postnasal drainage and accumulation of mucus in the back of the throat causing irritation and cough.  She has been off of her Zyrtec for the past 3 days while she is being treated for shingles.  - continue daily Zyrtec 10mg   - continue performing nasal saline rinse 1-2 times a day to help flush out sinuses  - for nasal drainage use Astelin (azelastine) 2 sprays each nostril twice a day.  Discussed at this time with increased cough and phlegm to use the azelastine twice a day.  Also she should not blow her nose after using the nasal spray.  It is best to blow the nose first if needed and then follow with your nasal spray so that you do not blow the medicated spray back out. - for nasal congestion if needed use OTC mometasone 2 sprays each nostril 3 times a week (M,W,F) to help decrease risk of nosebleeds  - for itchy/watery/red eyes use Pazeo 1 drop each eye daily as needed  - continue good hand hygiene with soap and water, scrub hands together at least 20 seconds  Shingles  - currently with an outbreak of shingles following stressful family event  - advised that she discuss her side effects related to valacyclovir with her PCP as well as if there are any topical preparations that can help decrease the pain  Follow-up 3-4 months

## 2019-02-10 NOTE — Progress Notes (Signed)
RE: Anna Hoffman MRN: [id number] DOB: Jul 19, 1935 Date of Telemedicine Visit: 02/10/2019  Referring provider: Glendale Chard, MD Primary care provider: Glendale Chard, MD  Chief Complaint: Cough and allergies   Telemedicine Follow Up Visit via Telephone: I connected with Anna Hoffman for a follow up on 02/10/19 by telephone and verified that I am speaking with the correct person using two identifiers.   I discussed the limitations, risks, security and privacy concerns of performing an evaluation and management service by telephone and the availability of in person appointments. I also discussed with the patient that there may be a patient responsible charge related to this service. The patient expressed understanding and agreed to proceed.  Patient is at home.  Provider is at the office.  Visit start time: 2505 Visit end time: [place] consent/check in by: Viola consent and medical assistant/nurse: Sharyn Lull K  History of Present Illness: She is a 84 y.o. female, who is being followed for allergic rhinitis with conjunctivitis. Her previous allergy office visit was on 10/06/18 with Dr. Nelva Bush.   Her nephew passed away suddenly 3 weeks ago and this has caused her to have a lot of stress.  She then developed a sore patchy rash on her back that was diagnosed as shingles last week and she was started on valacyclovir.  She states that her back is sore and painful to touch she is trying not to touch the areas.  She states she has been putting Vaseline on the rash which does help soothe.  She states the valacyclovir has been causing her to feel dizzy and she is vomiting with her meals.  She states she has been trying to get through to her PCP but has not been able to do so yet to discuss the side effects.  Because of the side effects she has been having related to the valacyclovir she states that she stopped taking her Zyrtec 3 to 4 days ago.  She states she changed her Zyrtec from [place]  she did not feel the Allegra to be effective.  She also states she stopped taking her vitamins as well since she has been on the valacyclovir.  She states she is having a lot of phlegm in her throat and this is causing her to cough.  She states the cough is been ongoing for the past 2 weeks.  She also states that she has been sitting outside on her porch because she is tired of being in the house since March.  She states that there is a stray cat that typically comes up to her while she is sitting on the porch.  She states she has only been using the azelastine at bedtime.  She reports that she will use the spray and then will blow her nose.  She denies having any fevers.  She states she has been quite cold inside her home though.   Assessment and Plan: Anna Hoffman is a 84 y.o. female with:   Allergic rhinitis with conjunctivitis  - continue avoidance measures for grass pollen, tree pollen, mold, dust mite and dog  - believe her cough is more so related to postnasal drainage and accumulation of mucus in the back of the throat causing irritation and cough.  She has been off of her Zyrtec for the past 3 days while she is being treated for shingles.  - continue daily Zyrtec 10mg   - continue performing nasal saline rinse 1-2 times a day to help flush out sinuses  - for  nasal drainage use Astelin (azelastine) 2 sprays each nostril twice a day.  Discussed at this time with increased cough and phlegm to use the azelastine twice a day.  Also she should not blow her nose after using the nasal spray.  It is best to blow the nose first if needed and then follow with your nasal spray so that you do not blow the medicated spray back out. - for nasal congestion if needed use OTC mometasone 2 sprays each nostril 3 times a week (M,W,F) to help decrease risk of nosebleeds  - for itchy/watery/red eyes use Pazeo 1 drop each eye daily as needed  - continue good hand hygiene with soap and water, scrub hands together at least 20  seconds  Shingles  - currently with an outbreak of shingles following stressful family event  - advised that she discuss her side effects related to valacyclovir with her PCP as well as if there are any topical preparations that can help decrease the pain  Follow-up 3-4 months  Diagnostics: None.  Medication List:  Current Outpatient Medications  Medication Sig Dispense Refill  . acetaminophen (TYLENOL) 325 MG tablet Take 2 tablets (650 mg total) by mouth every 6 (six) hours as needed for mild pain (or Fever >/= 101).    Marland Kitchen amLODipine (NORVASC) 10 MG tablet TAKE 1 TABLET BY MOUTH ONCE DAILY 90 tablet 2  . Ascorbic Acid (VITAMIN C) 1000 MG tablet Take 1,000 mg by mouth daily.    Marland Kitchen aspirin EC 81 MG tablet Take 81 mg by mouth daily.    Marland Kitchen azelastine (ASTELIN) 0.1 % nasal spray Place 2 sprays into both nostrils 2 (two) times daily. Use in each nostril as directed 30 mL 5  . cetirizine (ZYRTEC) 10 MG tablet Take 10 mg by mouth daily.    . Eluxadoline (VIBERZI) 75 MG TABS Take 75 mg by mouth daily. 30 tablet 0  . glucose blood (ONETOUCH VERIO) test strip 1 each by Other route as needed for other. Use as instructed to check blood pressure 2 times per day dx: e11.65 100 each 11  . irbesartan (AVAPRO) 150 MG tablet TAKE 1 TABLET BY MOUTH EVERY DAY. 90 tablet 0  . Multiple Vitamins-Minerals (STRESS TAB NF PO) Take 1 tablet by mouth daily.    . Olopatadine HCl (PAZEO) 0.7 % SOLN Place 1 drop into both eyes daily. 1 Bottle 5  . omeprazole (PRILOSEC) 20 MG capsule TAKE 1 CAPSULE BY MOUTH ONCE DAILY 30 MINUTES TO 1 HOUR BEFORE A MEAL 90 capsule 2  . ondansetron (ZOFRAN-ODT) 4 MG disintegrating tablet Take 1 tablet (4 mg total) by mouth every 8 (eight) hours as needed for nausea or vomiting. 20 tablet 0  . rosuvastatin (CRESTOR) 20 MG tablet TAKE 1 TABLET BY MOUTH ONCE DAILY 90 tablet 2  . trolamine salicylate (ASPERCREME) 10 % cream Apply 1 application topically as needed for muscle pain.    .  valACYclovir (VALTREX) 1000 MG tablet One tab po tid x 7 days 21 tablet 0  . vitamin B-12 (CYANOCOBALAMIN) 1000 MCG tablet Take 1,000 mcg by mouth daily.     No current facility-administered medications for this visit.    Allergies: Allergies  Allergen Reactions  . Amoxicillin Diarrhea    Has patient had a PCN reaction causing immediate rash, facial/tongue/throat swelling, SOB or lightheadedness with hypotension: no Has patient had a PCN reaction causing severe rash involving mucus membranes or skin necrosis: no Has patient had a PCN reaction that required  hospitalization: no pharmacist consult Has patient had a PCN reaction occurring within the last 10 years: yes If all of the above answers are "NO", then may proceed with Cephalosporin use.   . Ampicillin Rash    Has patient had a PCN reaction causing immediate rash, facial/tongue/throat swelling, SOB or lightheadedness with hypotension: No Has patient had a PCN reaction causing severe rash involving mucus membranes or skin necrosis: No Has patient had a PCN reaction that required hospitalization No Has patient had a PCN reaction occurring within the last 10 years: No If all of the above answers are "NO", then may proceed with Cephalosporin use.   . Sulfa Antibiotics Rash   I reviewed her past medical history, social history, family history, and environmental history and no significant changes have been reported from previous visit on 10/06/18.  Review of Systems  Constitutional: Positive for chills. Negative for fever.  HENT: Positive for postnasal drip and rhinorrhea. Negative for congestion, ear pain, sinus pressure, sinus pain, sneezing and sore throat.   Eyes: Negative for discharge, redness and itching.  Respiratory: Positive for cough. Negative for chest tightness, shortness of breath and wheezing.   Cardiovascular: Negative for chest pain.  Gastrointestinal: Negative.   Genitourinary: Negative.   Musculoskeletal: Positive for  back pain.  Skin: Positive for rash.  Neurological: Negative for headaches.   Objective: Physical Exam Not obtained as encounter was done via telephone.   Previous notes and tests were reviewed.  I discussed the assessment and treatment plan with the patient. The patient was provided an opportunity to ask questions and all were answered. The patient agreed with the plan and demonstrated an understanding of the instructions.   The patient was advised to call back or seek an in-person evaluation if the symptoms worsen or if the condition fails to improve as anticipated.  I provided 25 minutes of non-face-to-face time during this encounter.  It was my pleasure to participate in Anna Hoffman's care today. Please feel free to contact me with any questions or concerns.   Sincerely,   Charmian Muff, MD

## 2019-02-13 ENCOUNTER — Encounter (HOSPITAL_COMMUNITY): Payer: Self-pay | Admitting: Emergency Medicine

## 2019-02-13 ENCOUNTER — Other Ambulatory Visit: Payer: Self-pay

## 2019-02-13 ENCOUNTER — Emergency Department (HOSPITAL_COMMUNITY)
Admission: EM | Admit: 2019-02-13 | Discharge: 2019-02-13 | Disposition: A | Discharge status: Home / Self Care | Payer: Medicare Other | Attending: Emergency Medicine | Admitting: Emergency Medicine

## 2019-02-13 ENCOUNTER — Emergency Department (HOSPITAL_COMMUNITY): Payer: Medicare Other

## 2019-02-13 DIAGNOSIS — I129 Hypertensive chronic kidney disease with stage 1 through stage 4 chronic kidney disease, or unspecified chronic kidney disease: Secondary | ICD-10-CM | POA: Diagnosis not present

## 2019-02-13 DIAGNOSIS — R1110 Vomiting, unspecified: Secondary | ICD-10-CM | POA: Diagnosis not present

## 2019-02-13 DIAGNOSIS — R112 Nausea with vomiting, unspecified: Secondary | ICD-10-CM | POA: Insufficient documentation

## 2019-02-13 DIAGNOSIS — R0982 Postnasal drip: Secondary | ICD-10-CM | POA: Diagnosis not present

## 2019-02-13 DIAGNOSIS — R6883 Chills (without fever): Secondary | ICD-10-CM | POA: Diagnosis not present

## 2019-02-13 DIAGNOSIS — R531 Weakness: Secondary | ICD-10-CM | POA: Insufficient documentation

## 2019-02-13 DIAGNOSIS — R197 Diarrhea, unspecified: Secondary | ICD-10-CM | POA: Diagnosis not present

## 2019-02-13 DIAGNOSIS — N183 Chronic kidney disease, stage 3 (moderate): Secondary | ICD-10-CM | POA: Diagnosis not present

## 2019-02-13 DIAGNOSIS — R61 Generalized hyperhidrosis: Secondary | ICD-10-CM | POA: Diagnosis not present

## 2019-02-13 DIAGNOSIS — R05 Cough: Secondary | ICD-10-CM | POA: Diagnosis not present

## 2019-02-13 DIAGNOSIS — R51 Headache: Secondary | ICD-10-CM | POA: Diagnosis not present

## 2019-02-13 DIAGNOSIS — Z79899 Other long term (current) drug therapy: Secondary | ICD-10-CM | POA: Diagnosis not present

## 2019-02-13 DIAGNOSIS — Z7982 Long term (current) use of aspirin: Secondary | ICD-10-CM | POA: Diagnosis not present

## 2019-02-13 DIAGNOSIS — J3489 Other specified disorders of nose and nasal sinuses: Secondary | ICD-10-CM | POA: Insufficient documentation

## 2019-02-13 DIAGNOSIS — R0981 Nasal congestion: Secondary | ICD-10-CM | POA: Insufficient documentation

## 2019-02-13 DIAGNOSIS — E1122 Type 2 diabetes mellitus with diabetic chronic kidney disease: Secondary | ICD-10-CM | POA: Diagnosis not present

## 2019-02-13 LAB — CBC WITH DIFFERENTIAL/PLATELET
Abs Immature Granulocytes: 0.03 10*3/uL (ref 0.00–0.07)
Basophils Absolute: 0.1 10*3/uL (ref 0.0–0.1)
Basophils Relative: 1 %
Eosinophils Absolute: 0.2 10*3/uL (ref 0.0–0.5)
Eosinophils Relative: 3 %
HCT: 44.3 % (ref 36.0–46.0)
Hemoglobin: 14.1 g/dL (ref 12.0–15.0)
Immature Granulocytes: 0 %
Lymphocytes Relative: 22 %
Lymphs Abs: 1.6 10*3/uL (ref 0.7–4.0)
MCH: 29.1 pg (ref 26.0–34.0)
MCHC: 31.8 g/dL (ref 30.0–36.0)
MCV: 91.5 fL (ref 80.0–100.0)
Monocytes Absolute: 0.6 10*3/uL (ref 0.1–1.0)
Monocytes Relative: 8 %
Neutro Abs: 4.9 10*3/uL (ref 1.7–7.7)
Neutrophils Relative %: 66 %
Platelets: 308 10*3/uL (ref 150–400)
RBC: 4.84 MIL/uL (ref 3.87–5.11)
RDW: 14.1 % (ref 11.5–15.5)
WBC: 7.4 10*3/uL (ref 4.0–10.5)
nRBC: 0 % (ref 0.0–0.2)

## 2019-02-13 LAB — COMPREHENSIVE METABOLIC PANEL
ALT: 16 U/L (ref 0–44)
AST: 24 U/L (ref 15–41)
Albumin: 4.4 g/dL (ref 3.5–5.0)
Alkaline Phosphatase: 89 U/L (ref 38–126)
Anion gap: 11 (ref 5–15)
BUN: 13 mg/dL (ref 8–23)
CO2: 21 mmol/L — ABNORMAL LOW (ref 22–32)
Calcium: 9.3 mg/dL (ref 8.9–10.3)
Chloride: 107 mmol/L (ref 98–111)
Creatinine, Ser: 1.01 mg/dL — ABNORMAL HIGH (ref 0.44–1.00)
GFR calc Af Amer: >60 mL/min (ref >60)
GFR calc non Af Amer: 52 mL/min — ABNORMAL LOW (ref >60)
Glucose, Bld: 115 mg/dL — ABNORMAL HIGH (ref 70–99)
Potassium: 3.4 mmol/L — ABNORMAL LOW (ref 3.5–5.1)
Sodium: 139 mmol/L (ref 135–145)
Total Bilirubin: 0.3 mg/dL (ref 0.3–1.2)
Total Protein: 8 g/dL (ref 6.5–8.1)

## 2019-02-13 LAB — URINALYSIS, ROUTINE W REFLEX MICROSCOPIC
Bacteria, UA: NONE SEEN
Bilirubin Urine: NEGATIVE
Glucose, UA: NEGATIVE mg/dL
Hgb urine dipstick: NEGATIVE
Ketones, ur: NEGATIVE mg/dL
Nitrite: NEGATIVE
Protein, ur: NEGATIVE mg/dL
Specific Gravity, Urine: 1.016 (ref 1.005–1.030)
pH: 7 (ref 5.0–8.0)

## 2019-02-13 LAB — LIPASE, BLOOD: Lipase: 21 U/L (ref 11–51)

## 2019-02-13 MED ORDER — SODIUM CHLORIDE 0.9 % IV BOLUS
500.0000 mL | Freq: Once | INTRAVENOUS | Status: AC
  Administered 2019-02-13: 12:00:00 500 mL via INTRAVENOUS

## 2019-02-13 MED ORDER — SODIUM CHLORIDE (PF) 0.9 % IJ SOLN
INTRAMUSCULAR | Status: AC
  Filled 2019-02-13: qty 50

## 2019-02-13 MED ORDER — IOHEXOL 300 MG/ML  SOLN
100.0000 mL | Freq: Once | INTRAMUSCULAR | Status: AC | PRN
  Administered 2019-02-13: 100 mL via INTRAVENOUS

## 2019-02-13 MED ORDER — SODIUM CHLORIDE 0.9 % IV BOLUS
500.0000 mL | Freq: Once | INTRAVENOUS | Status: AC
  Administered 2019-02-13: 500 mL via INTRAVENOUS

## 2019-02-13 MED ORDER — LOPERAMIDE HCL 2 MG PO CAPS: 2.0000 mg | ORAL_CAPSULE | Freq: Four times a day (QID) | ORAL | 0 refills | Status: DC | PRN

## 2019-02-13 NOTE — ED Notes (Signed)
Patient transported to CT 

## 2019-02-13 NOTE — ED Triage Notes (Signed)
Patient here from home brought in by son with complaints of diarrhea since taking Valacyclovir. Reports that she was diagnosed with shingles and cannot take the medication.

## 2019-02-13 NOTE — ED Provider Notes (Signed)
[facility] DEPT Provider Note   CSN: [id number] Arrival date & time: 02/13/19  1018    History   Chief Complaint Chief Complaint  Patient presents with  . Diarrhea  . Herpes Zoster    HPI Anna Hoffman is a 84 y.o. female.     HPI   Patient is an 84 year old female with a history of diabetes, hypertension, CVA, CKD, hyperlipidemia, who presents the emergency department today for evaluation of nausea vomiting and diarrhea.  She states that she was diagnosed with shingles on Monday, 02/07/2019.  She was given a prescription for valacyclovir.  She took her first dose that night and it caused her to have vomiting and diarrhea.  She states that on Tuesday she spoke with a family member who is a physician who told her to half the dose.  She states she was only taking half of a dose twice daily and then she stopped taking it altogether on Thursday, 02/10/2019.  She has continued to have vomiting and diarrhea.  Denies any persistent or severe abdominal pain but states she has cramping prior to her bowel movements.  Denies any bloody stools.  She has had generalized weakness sweats and chills.  She denies any dysuria or frequency.  She reports that she has had rhinorrhea/nasal congestion and postnasal drip secondary to allergies.  She has had a mild cough that she states is secondary to postnasal drip.  She denies any chest pain or shortness of breath.  Also c/o a headache for the last several days. No visual changes. No new weakness/numbness. 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  She does endorse a history of chronic diarrhea but states she has not had diarrhea in about a month.  States her diarrhea is normally not this severe.  Patient denies any recent antibiotic use.  Past Medical History:  Diagnosis Date  . Diabetes mellitus without complication ([place])   . Hypertension   . Hypokalemia   . Stroke [facility])     Patient Active Problem  List   Diagnosis Date Noted  . Other long term (current) drug therapy 08/12/2018  . Chronic renal disease, stage III ([place]) 07/19/2018  . Hypertensive nephropathy 07/19/2018  . Community acquired pneumonia of right upper lobe of lung ([place]) 11/07/2017  . Hypertensive emergency 07/01/2016  . Thalamic hemorrhage ([place]) 07/01/2016  . 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 Benign essential HTN   . Type 2 diabetes mellitus with stage 3 chronic kidney disease, without long-term current use of insulin ([place])   . Mixed hyperlipidemia   . ICH (intracerebral hemorrhage) ([place]) - hypertensive R thalamic hemorrhage  06/27/2016  . 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 Retroperitoneal bleed 02/08/2016  . Abdominal pain   . Gastroenteritis 02/04/2016  . Diarrhea 02/04/2016  . Hypokalemia 02/04/2016  . Incarcerated paraesophageal hernia 09/02/2014  . Renal mass, left 09/02/2014  . Acute esophagitis 09/02/2014  . Gastric outlet obstruction 09/02/2014  . Hypertension   . Vomiting 09/01/2014    Past Surgical History:  Procedure Laterality Date  . ABDOMINAL HYSTERECTOMY    . BREAST EXCISIONAL BIOPSY Left   . ESOPHAGOGASTRODUODENOSCOPY N/A 09/01/2014   Procedure: ESOPHAGOGASTRODUODENOSCOPY (EGD);  Surgeon: Lear Ng, MD;  Location: [facility];  Service: Endoscopy;  Laterality: N/A;  . HIATAL HERNIA REPAIR N/A 09/04/2014   Procedure: LAPAROSCOPIC REPAIR OF HIATAL HERNIA;  Surgeon: Excell Seltzer, MD;  Location: [facility];  Service: General;  Laterality: N/A;  With MESH  .  IR GENERIC HISTORICAL  04/10/2016   IR US GUIDE VASC ACCESS RIGHT 04/10/2016 Corrie Mckusick, DO [facility]  . IR GENERIC HISTORICAL  04/10/2016   IR ANGIOGRAM SELECTIVE EACH ADDITIONAL VESSEL 04/10/2016 Corrie Mckusick, DO [facility]  . IR GENERIC HISTORICAL  04/10/2016   IR ANGIOGRAM SELECTIVE EACH ADDITIONAL VESSEL 04/10/2016 Corrie Mckusick, DO [facility]  . IR GENERIC HISTORICAL  04/10/2016   IR EMBO TUMOR ORGAN ISCHEMIA  INFARCT INC GUIDE ROADMAPPING 04/10/2016 Corrie Mckusick, DO [facility]  . IR GENERIC HISTORICAL  04/10/2016   IR ANGIOGRAM SELECTIVE EACH ADDITIONAL VESSEL 04/10/2016 Corrie Mckusick, DO [facility]  . IR GENERIC HISTORICAL  04/10/2016   IR ANGIOGRAM SELECTIVE EACH ADDITIONAL VESSEL 04/10/2016 Corrie Mckusick, DO [facility]  . IR GENERIC HISTORICAL  04/10/2016   IR RENAL SELECTIVE  UNI INC S&I MOD SED 04/10/2016 Corrie Mckusick, DO [facility]  . IR GENERIC HISTORICAL  03/06/2016   IR RADIOLOGIST EVAL & MGMT 03/06/2016 Corrie Mckusick, DO [facility]  . IR GENERIC HISTORICAL  03/26/2016   IR RADIOLOGIST EVAL & MGMT 03/26/2016 Corrie Mckusick, DO [facility]  . IR GENERIC HISTORICAL  04/29/2016   IR RADIOLOGIST EVAL & MGMT 04/29/2016 [facility]  . IR RADIOLOGIST EVAL & MGMT  11/12/2016  . IR RADIOLOGIST EVAL & MGMT  12/16/2017  . KNEE SURGERY    . TONSILLECTOMY       OB History   No obstetric history on file.      Home Medications    Prior to Admission medications   Medication Sig Start Date End Date Taking? Authorizing Provider  acetaminophen (TYLENOL) 325 MG tablet Take 2 tablets (650 mg total) by mouth every 6 (six) hours as needed for mild pain (or Fever >/= 101). 02/08/16  Yes Johnson, Clanford L, MD  amLODipine (NORVASC) 10 MG tablet TAKE 1 TABLET BY MOUTH ONCE DAILY Patient taking differently: Take 10 mg by mouth daily.  10/29/18  Yes Glendale Chard, MD  Ascorbic Acid (VITAMIN C) 1000 MG tablet Take 1,000 mg by mouth daily.   Yes [provider]  aspirin EC 81 MG tablet Take 81 mg by mouth daily.   Yes [provider]  azelastine (ASTELIN) 0.1 % nasal spray Place 2 sprays into both nostrils 2 (two) times daily. Use in each nostril as directed 10/06/18  Yes Padgett, Rae Halsted, MD  cetirizine (ZYRTEC) 10 MG tablet Take 10 mg by mouth daily.   Yes [provider]  Eluxadoline (VIBERZI) 75 MG TABS Take 75 mg by mouth daily. 02/07/19  Yes Glendale Chard, MD   irbesartan (AVAPRO) 150 MG tablet TAKE 1 TABLET BY MOUTH EVERY DAY. Patient taking differently: Take 150 mg by mouth daily.  12/15/18  Yes Glendale Chard, MD  Multiple Vitamins-Minerals (STRESS TAB NF PO) Take 1 tablet by mouth daily.   Yes [provider]  Olopatadine HCl (PAZEO) 0.7 % SOLN Place 1 drop into both eyes daily. 10/06/18  Yes Padgett, Rae Halsted, MD  omeprazole (PRILOSEC) 20 MG capsule TAKE 1 CAPSULE BY MOUTH ONCE DAILY 30 MINUTES TO 1 HOUR BEFORE A MEAL Patient taking differently: Take 20 mg by mouth daily.  10/29/18  Yes Glendale Chard, MD  ondansetron (ZOFRAN-ODT) 4 MG disintegrating tablet Take 1 tablet (4 mg total) by mouth every 8 (eight) hours as needed for nausea or vomiting. 11/10/17  Yes Bonnielee Haff, MD  rosuvastatin (CRESTOR) 20 MG tablet TAKE 1 TABLET BY MOUTH ONCE DAILY Patient taking differently:  Take 20 mg by mouth daily.  10/29/18  Yes Glendale Chard, MD  trolamine salicylate (ASPERCREME) 10 % cream Apply 1 application topically as needed for muscle pain.   Yes [provider]  valACYclovir (VALTREX) 1000 MG tablet One tab po tid x 7 days Patient taking differently: Take 1,000 mg by mouth 3 (three) times daily. For 7 days 02/07/19  Yes Glendale Chard, MD  vitamin B-12 (CYANOCOBALAMIN) 1000 MCG tablet Take 1,000 mcg by mouth daily.   Yes [provider]  glucose blood (ONETOUCH VERIO) test strip 1 each by Other route as needed for other. Use as instructed to check blood pressure 2 times per day dx: e11.65 10/25/18   Glendale Chard, MD  loperamide (IMODIUM) 2 MG capsule Take 1 capsule (2 mg total) by mouth 4 (four) times daily as needed for diarrhea or loose stools. 02/13/19   Shatoria Stooksbury S, PA-C    Family History Family History  Problem Relation Age of Onset  . Alzheimer's disease Mother   . Prostate cancer Father   . Brain cancer Brother   . Brain cancer Brother   . Allergic rhinitis Neg Hx   . Asthma Neg Hx   . Eczema Neg Hx    . Urticaria Neg Hx     Social History Social History   Tobacco Use  . Smoking status: Never Smoker  . Smokeless tobacco: Never Used  Substance Use Topics  . Alcohol use: No  . Drug use: No     Allergies   Amoxicillin, Ampicillin, and Sulfa antibiotics   Review of Systems Review of Systems  Constitutional: Positive for chills and diaphoresis.  HENT: Positive for congestion, postnasal drip and rhinorrhea. Negative for ear pain and sore throat.   Eyes: Negative for pain and visual disturbance.  Respiratory: Positive for cough (2/2 postnasal drip). Negative for shortness of breath.   Cardiovascular: Negative for chest pain.  Gastrointestinal: Positive for diarrhea, nausea and vomiting. Negative for abdominal pain and blood in stool.  Genitourinary: Negative for dysuria and hematuria.  Musculoskeletal: Negative for back pain.  Skin: Negative for color change and rash.  Neurological: Positive for weakness (generally weak) and headaches. Negative for dizziness, light-headedness and numbness.  All other systems reviewed and are negative.    Physical Exam Updated Vital Signs BP (!) 149/86   Pulse 100   Temp 98.2 F (36.8 C) (Oral)   Resp 18   SpO2 100%   Physical Exam Vitals signs and nursing note reviewed.  Constitutional:      General: She is not in acute distress.    Appearance: She is well-developed. She is not ill-appearing or toxic-appearing.  HENT:     Head: Normocephalic and atraumatic.     Nose: Nose normal.     Mouth/Throat:     Mouth: Mucous membranes are dry.  Eyes:     Conjunctiva/sclera: Conjunctivae normal.  Neck:     Musculoskeletal: Neck supple.  Cardiovascular:     Rate and Rhythm: Normal rate and regular rhythm.     Pulses: Normal pulses.     Heart sounds: Normal heart sounds. No murmur.  Pulmonary:     Effort: Pulmonary effort is normal. No respiratory distress.     Breath sounds: Normal breath sounds. No wheezing, rhonchi or rales.   Abdominal:     General: Bowel sounds are normal. There is no distension.     Palpations: Abdomen is soft.     Tenderness: There is no abdominal tenderness. There is  no guarding or rebound.  Musculoskeletal:     Comments: Faintly erythematous rash to the left upper back with some areas of scarring. No vesicular lesions noted.  Skin:    General: Skin is warm and dry.  Neurological:     Mental Status: She is alert.     Comments: Mental Status:  Alert, thought content appropriate, able to give a coherent history. Speech fluent without evidence of aphasia. Able to follow 2 step commands without difficulty.  Cranial Nerves:  II:  pupils equal, round, reactive to light III,IV, VI: ptosis not present, extra-ocular motions intact bilaterally  V,VII: smile symmetric, facial light touch sensation equal VIII: hearing grossly normal to voice  X: uvula elevates symmetrically  XI: bilateral shoulder shrug symmetric and strong XII: midline tongue extension without fassiculations Motor:  Normal tone. 5/5 strength of BUE and BLE major muscle groups including strong and equal grip strength and dorsiflexion/plantar flexion Sensory: light touch normal in all extremities. Gait: normal gait and balance.       ED Treatments / Results  Labs (all labs ordered are listed, but only abnormal results are displayed) Labs Reviewed  COMPREHENSIVE METABOLIC PANEL - Abnormal; Notable for the following components:      Result Value   Potassium 3.4 (*)    CO2 21 (*)    Glucose, Bld 115 (*)    Creatinine, Ser 1.01 (*)    GFR calc non Af Amer 52 (*)    All other components within normal limits  URINALYSIS, ROUTINE W REFLEX MICROSCOPIC - Abnormal; Notable for the following components:   Color, Urine STRAW (*)    Leukocytes,Ua SMALL (*)    All other components within normal limits  URINE CULTURE  CBC WITH DIFFERENTIAL/PLATELET  LIPASE, BLOOD    EKG None  Radiology Ct Head Wo Contrast  Result Date:  02/13/2019 CLINICAL DATA:  Altered mental status EXAM: CT HEAD WITHOUT CONTRAST TECHNIQUE: Contiguous axial images were obtained from the base of the skull through the vertex without intravenous contrast. COMPARISON:  June 29, 2016 FINDINGS: Brain: Mild to moderate diffuse atrophy is stable. There is no intracranial mass, hemorrhage, extra-axial fluid collection, or midline shift. There is patchy small vessel disease throughout the centra semiovale bilaterally. There is evidence of a prior infarct in the anterior right thalamus. The previous hemorrhage in this area has resolved. There is small vessel disease in each internal and external capsule region, primarily anteriorly. There is evidence of a prior small infarct in the posterior mid left cerebellum. No acute infarct is demonstrable. Basal ganglia calcification bilaterally is physiologic in this age group. Vascular: There is no appreciable hyperdense vessel. There is calcification in each carotid siphon region. Skull: The bony calvarium appears intact. Sinuses/Orbits: There is an air-fluid level in the left sphenoid sinus. There is slight mucosal thickening in several ethmoid air cells. Orbits appear symmetric bilaterally. Other: Mastoid air cells on the right are clear. There are opacified posterior left mastoid air cells. IMPRESSION: Atrophy with periventricular small vessel disease. Prior focal infarcts in the right thalamus and left cerebellum. No acute infarct is evident. No mass or hemorrhage. There are foci of arterial vascular calcification. There is paranasal sinus disease with opacification and air-fluid level in the left sphenoid sinus suggesting acute sinusitis in this area. There is mild posterior left mastoid air cell disease. Electronically Signed   By: Lowella Grip III M.D.   On: 02/13/2019 13:44   Ct Abdomen Pelvis W Contrast  Result Date: 02/13/2019 CLINICAL  DATA:  Nausea, vomiting, and diarrhea for 1 week. EXAM: CT ABDOMEN AND  PELVIS WITH CONTRAST TECHNIQUE: Multidetector CT imaging of the abdomen and pelvis was performed using the standard protocol following bolus administration of intravenous contrast. CONTRAST:  131mL OMNIPAQUE IOHEXOL 300 MG/ML  SOLN COMPARISON:  11/12/2016 FINDINGS: Lower Chest: No acute findings. Hepatobiliary: No hepatic masses identified. Gallbladder is unremarkable. Pancreas:  No mass or inflammatory changes. Spleen: Within normal limits in size and appearance. Adrenals/Urinary Tract: A 2.4 cm subcapsular mass in the anterior upper pole the left kidney remains stable. This contains macroscopic fat, and is consistent with a benign angiomyolipoma. No other renal masses are identified. Several renal cysts are again seen bilaterally. No evidence of ureteral calculi or hydronephrosis. Unremarkable unopacified urinary bladder. Stomach/Bowel: Prior Nissen fundoplication again seen. No evidence of acute inflammatory process, bowel obstruction, or abnormal fluid collections. Diverticulosis and associated muscular hypertrophy is seen involving the sigmoid colon. No evidence of acute diverticulitis. Vascular/Lymphatic: No pathologically enlarged lymph nodes. No abdominal aortic aneurysm. Aortic atherosclerosis. Reproductive: Prior hysterectomy noted. Adnexal regions are unremarkable in appearance. Other:  None. Musculoskeletal:  No suspicious bone lesions identified. IMPRESSION: 1. No acute findings within the abdomen or pelvis. 2. Stable benign left renal angiomyolipoma. 3. Colonic diverticulosis, without radiographic evidence of diverticulitis. Aortic Atherosclerosis (ICD10-I70.0). Electronically Signed   By: Marlaine Hind M.D.   On: 02/13/2019 13:52   Dg Chest Portable 1 View  Result Date: 02/13/2019 CLINICAL DATA:  Cough. EXAM: PORTABLE CHEST 1 VIEW COMPARISON:  09/01/2018 FINDINGS: The heart size and mediastinal contours are within normal limits. Aortic atherosclerosis. Both lungs are clear. IMPRESSION: No active  disease. Electronically Signed   By: Marlaine Hind M.D.   On: 02/13/2019 13:31    Procedures Procedures (including critical care time)  Medications Ordered in ED Medications  sodium chloride (PF) 0.9 % injection (has no administration in time range)  sodium chloride 0.9 % bolus 500 mL (0 mLs Intravenous Stopped 02/13/19 1308)  iohexol (OMNIPAQUE) 300 MG/ML solution 100 mL (100 mLs Intravenous Contrast Given 02/13/19 1244)  sodium chloride 0.9 % bolus 500 mL (0 mLs Intravenous Stopped 02/13/19 1508)     Initial Impression / Assessment and Plan / ED Course  I have reviewed the triage vital signs and the nursing notes.  Pertinent labs & imaging results that were available during my care of the patient were reviewed by me and considered in my medical decision making (see chart for details).   Final Clinical Impressions(s) / ED Diagnoses   Final diagnoses:  Diarrhea, unspecified type   84 year old female presenting for evaluation of nausea, vomiting, diarrhea after starting valacyclovir last week for shingles.  Is also had sweats and chills as well.  Has had no urinary complaints.  No bloody diarrhea.  Has had a mild cough but states secondary to postnasal drip.  On initial evaluation, patient afebrile with normal vital signs.  Nontoxic nonseptic appearing.  Neuro exam is normal.  Abdomen soft nontender.  Mucous membranes are dry.  Will obtain laboratory work, UA.  We will also obtain imaging of the head given her history of nontraumatic spontaneous hemorrhagic CVA in setting of headache nausea and vomiting.  We will also obtain chest x-ray given her cough and CT abdomen pelvis for her 1 week history of nausea vomiting and diarrhea.  CBC significant for no leukocytosis or anemia. CMP with mild hypokalemia, no other gross electrolyte derangement.  Kidney function and liver function are normal. Lipase is negative UA with  small leuks, 0-5 RBC and WBC. 6-10 squamous epithelial cells  Chest  x-ray shows no signs of pneumonia CT of the head does not show any evidence of bleeding or new stroke.  CT abd/pelvis without acute findings.   On reassessment, patient well-appearing.  Nontoxic and in no acute distress.  She is able to tolerate p.o.  She has had no episodes of vomiting.  Discussed results of her work-up showing no acute findings.  Advised that she will need to follow-up with her primary care doctor tomorrow.  Have advised her to return to the ER for new or worsening symptoms in the meantime.  She voices understanding of the plan and reasons to return.  All questions answered.  Patient stable for discharge.  Patient seen in conjunction with Dr. Rogene Houston who personally evaluated the patient.  He is in agreement with the plan to discontinue valacyclovir and not add any new antivirals given that patient's rash is improving and is not causing her any significant discomfort. Recommended treating symptoms with Imodium.  ED Discharge Orders         Ordered    loperamide (IMODIUM) 2 MG capsule  4 times daily PRN     02/13/19 1521           Corrisa Gibby S, PA-C 02/13/19 1522    Fredia Sorrow, MD 02/15/19 1533

## 2019-02-13 NOTE — ED Provider Notes (Signed)
Medical screening examination/treatment/procedure(s) were conducted as a shared visit with non-physician practitioner(s) and myself.  I personally evaluated the patient during the encounter.     Patient seen by me along with physician assistant.  Patient brought from home by son with multiple complaints.  Main complaint is diarrhea.  Patient was diagnosed with shingles on the left upper back area on Monday and has been taking valacyclovir since that time.  However she cut it short because she felt it caused diarrhea.  Patient also with a complaint of abdominal pain and also with a complaint of headache.  Based on the multiple complaints and over not so sure that these are all related to the shingles shingle rash not very impressive at the moment there is no vesicles.  There is some erythema patient only complains about itching that complaining of a lot of pain in that area.  Patient will have chest x-ray CT abdomen CT head.  And will be reassessed.  If those all showed no acute findings patient can be treated symptomatically.    Fredia Sorrow, MD 02/13/19 1251

## 2019-02-13 NOTE — Discharge Instructions (Signed)
You were given medication to help with your diarrhea.  Please take as directed.  Please make sure to stay well-hydrated at home.  Please call your regular doctor tomorrow to set up an appointment for follow-up next week.  If you have any new or worsening symptoms in the meantime including abdominal pain, continued diarrhea, dehydration, please return to the emergency department immediately.

## 2019-02-14 ENCOUNTER — Other Ambulatory Visit: Payer: Self-pay

## 2019-02-14 DIAGNOSIS — B029 Zoster without complications: Secondary | ICD-10-CM

## 2019-02-14 LAB — URINE CULTURE: Culture: NO GROWTH

## 2019-02-14 MED ORDER — ACYCLOVIR 5 % EX OINT: TOPICAL_OINTMENT | CUTANEOUS | 0 refills | Status: DC

## 2019-02-15 MED FILL — ACYCLOVIR 5% OINTMENT: 5 | 20 days supply | Qty: 15 | Fill #0

## 2019-02-16 ENCOUNTER — Other Ambulatory Visit: Payer: Self-pay

## 2019-02-16 ENCOUNTER — Ambulatory Visit (INDEPENDENT_AMBULATORY_CARE_PROVIDER_SITE_OTHER): Payer: Medicare Other | Admitting: Nurse Practitioner

## 2019-02-16 ENCOUNTER — Encounter: Payer: Self-pay | Admitting: Nurse Practitioner

## 2019-02-16 VITALS — Wt 139.0 lb

## 2019-02-16 DIAGNOSIS — R197 Diarrhea, unspecified: Secondary | ICD-10-CM | POA: Diagnosis not present

## 2019-02-16 DIAGNOSIS — Z09 Encounter for follow-up examination after completed treatment for conditions other than malignant neoplasm: Secondary | ICD-10-CM | POA: Diagnosis not present

## 2019-02-16 DIAGNOSIS — B029 Zoster without complications: Secondary | ICD-10-CM

## 2019-02-16 NOTE — Progress Notes (Signed)
Virtual Visit via Video   This visit type was conducted due to national recommendations for restrictions regarding the COVID-19 Pandemic (e.g. social distancing) in an effort to limit this patient's exposure and mitigate transmission in our community.  Due to her co-morbid illnesses, this patient is at least at moderate risk for complications without adequate follow up.  This format is felt to be most appropriate for this patient at this time.  All issues noted in this document were discussed and addressed.  A limited physical exam was performed with this format.    This visit type was conducted due to national recommendations for restrictions regarding the COVID-19 Pandemic (e.g. social distancing) in an effort to limit this patient's exposure and mitigate transmission in our community.  Patients identity confirmed using two different identifiers.  This format is felt to be most appropriate for this patient at this time.  All issues noted in this document were discussed and addressed.  No physical exam was performed (except for noted visual exam findings with Video Visits).    Date:  02/21/2019   ID:  Jennfier Abdulla, DOB [birth year], MRN [id number]  Patient Location:  Home - spoke with Drinda Butts  Provider location:   Office    Chief Complaint:  Follow up ER visit for diarrhea  History of Present Illness:    Shahida Schnackenberg is a 84 y.o. female who presents via video conferencing for a telehealth visit today.    The patient does not have symptoms concerning for COVID-19 infection (fever, chills, cough, or new shortness of breath).   She was diagnosed with shingles and was taking an antiviral which the ED provider felt was causing her diarrhea on 02/13/2019.  She has not had any other diarrhea and vomiting since 7/12.  Every time she would take the pill she would be sick to her stomach. She is now zovirax.  She has been using lotion and aloe.  The left side is giving.  She is eating soup and  baked potato.  She feels like she is doing better.  She is now using zovirax cream for her shingles which she is tolerating well.     Past Medical History:  Diagnosis Date  . Diabetes mellitus without complication ([place])   . Hypertension   . Hypokalemia   . Stroke [facility])    Past Surgical History:  Procedure Laterality Date  . ABDOMINAL HYSTERECTOMY    . BREAST EXCISIONAL BIOPSY Left   . ESOPHAGOGASTRODUODENOSCOPY N/A 09/01/2014   Procedure: ESOPHAGOGASTRODUODENOSCOPY (EGD);  Surgeon: Lear Ng, MD;  Location: [facility];  Service: Endoscopy;  Laterality: N/A;  . HIATAL HERNIA REPAIR N/A 09/04/2014   Procedure: LAPAROSCOPIC REPAIR OF HIATAL HERNIA;  Surgeon: Excell Seltzer, MD;  Location: [facility];  Service: General;  Laterality: N/A;  With MESH  . IR GENERIC HISTORICAL  04/10/2016   IR US GUIDE VASC ACCESS RIGHT 04/10/2016 Corrie Mckusick, DO [facility]  . IR GENERIC HISTORICAL  04/10/2016   IR ANGIOGRAM SELECTIVE EACH ADDITIONAL VESSEL 04/10/2016 Corrie Mckusick, DO [facility]  . IR GENERIC HISTORICAL  04/10/2016   IR ANGIOGRAM SELECTIVE EACH ADDITIONAL VESSEL 04/10/2016 Corrie Mckusick, DO [facility]  . IR GENERIC HISTORICAL  04/10/2016   IR EMBO TUMOR ORGAN ISCHEMIA INFARCT INC GUIDE ROADMAPPING 04/10/2016 Corrie Mckusick, DO [facility]  . IR GENERIC HISTORICAL  04/10/2016   IR ANGIOGRAM SELECTIVE EACH ADDITIONAL VESSEL 04/10/2016 Corrie Mckusick, DO [facility]  . IR GENERIC HISTORICAL  04/10/2016  IR ANGIOGRAM SELECTIVE EACH ADDITIONAL VESSEL 04/10/2016 Corrie Mckusick, DO [facility]  . IR GENERIC HISTORICAL  04/10/2016   IR RENAL SELECTIVE  UNI INC S&I MOD SED 04/10/2016 Corrie Mckusick, DO [facility]  . IR GENERIC HISTORICAL  03/06/2016   IR RADIOLOGIST EVAL & MGMT 03/06/2016 Corrie Mckusick, DO [facility]  . IR GENERIC HISTORICAL  03/26/2016   IR RADIOLOGIST EVAL & MGMT 03/26/2016 Corrie Mckusick, DO [facility]  . IR GENERIC HISTORICAL  04/29/2016   IR RADIOLOGIST EVAL & MGMT 04/29/2016  [facility]  . IR RADIOLOGIST EVAL & MGMT  11/12/2016  . IR RADIOLOGIST EVAL & MGMT  12/16/2017  . KNEE SURGERY    . TONSILLECTOMY       Current Meds  Medication Sig  . acetaminophen (TYLENOL) 325 MG tablet Take 2 tablets (650 mg total) by mouth every 6 (six) hours as needed for mild pain (or Fever >/= 101).  Marland Kitchen acyclovir ointment (ZOVIRAX) 5 % Apply thin layer to affected area  . amLODipine (NORVASC) 10 MG tablet TAKE 1 TABLET BY MOUTH ONCE DAILY (Patient taking differently: Take 10 mg by mouth daily. )  . aspirin EC 81 MG tablet Take 81 mg by mouth daily.  Marland Kitchen azelastine (ASTELIN) 0.1 % nasal spray Place 2 sprays into both nostrils 2 (two) times daily. Use in each nostril as directed  . cetirizine (ZYRTEC) 10 MG tablet Take 10 mg by mouth daily.  . Eluxadoline (VIBERZI) 75 MG TABS Take 75 mg by mouth daily.  Marland Kitchen glucose blood (ONETOUCH VERIO) test strip 1 each by Other route as needed for other. Use as instructed to check blood pressure 2 times per day dx: e11.65  . irbesartan (AVAPRO) 150 MG tablet TAKE 1 TABLET BY MOUTH EVERY DAY. (Patient taking differently: Take 150 mg by mouth daily. )  . loperamide (IMODIUM) 2 MG capsule Take 1 capsule (2 mg total) by mouth 4 (four) times daily as needed for diarrhea or loose stools.  . Multiple Vitamins-Minerals (STRESS TAB NF PO) Take 1 tablet by mouth daily.  . Olopatadine HCl (PAZEO) 0.7 % SOLN Place 1 drop into both eyes daily.  Marland Kitchen omeprazole (PRILOSEC) 20 MG capsule TAKE 1 CAPSULE BY MOUTH ONCE DAILY 30 MINUTES TO 1 HOUR BEFORE A MEAL (Patient taking differently: Take 20 mg by mouth daily. )  . ondansetron (ZOFRAN-ODT) 4 MG disintegrating tablet Take 1 tablet (4 mg total) by mouth every 8 (eight) hours as needed for nausea or vomiting.  . rosuvastatin (CRESTOR) 20 MG tablet TAKE 1 TABLET BY MOUTH ONCE DAILY (Patient taking differently: Take 20 mg by mouth daily. )  . trolamine salicylate (ASPERCREME) 10 % cream Apply 1 application topically as  needed for muscle pain.  . vitamin B-12 (CYANOCOBALAMIN) 1000 MCG tablet Take 1,000 mcg by mouth daily.     Allergies:   Amoxicillin, Ampicillin, and Sulfa antibiotics   Social History   Tobacco Use  . Smoking status: Never Smoker  . Smokeless tobacco: Never Used  Substance Use Topics  . Alcohol use: No  . Drug use: No     Family Hx: The patient's family history includes Alzheimer's disease in her mother; Brain cancer in her brother and brother; Prostate cancer in her father. There is no history of Allergic rhinitis, Asthma, Eczema, or Urticaria.  ROS:   Please see the history of present illness.    Review of Systems  Constitutional: Negative for fever.  Respiratory: Negative.  Negative for cough  and shortness of breath.   Cardiovascular: Negative for chest pain and palpitations.  Neurological: Negative for dizziness and tingling.  Psychiatric/Behavioral: Negative for depression.    All other systems reviewed and are negative.   Labs/Other Tests and Data Reviewed:    Recent Labs: 02/13/2019: ALT 16; BUN 13; Creatinine, Ser 1.01; Hemoglobin 14.1; Platelets 308; Potassium 3.4; Sodium 139   Recent Lipid Panel Lab Results  Component Value Date/Time   CHOL 224 (H) 06/29/2016 03:30 AM   TRIG 180 (H) 06/29/2016 03:30 AM   HDL 64 06/29/2016 03:30 AM   CHOLHDL 3.5 06/29/2016 03:30 AM   LDLCALC 124 (H) 06/29/2016 03:30 AM    Wt Readings from Last 3 Encounters:  02/16/19 139 lb (63 kg)  02/10/19 139 lb (63 kg)  02/07/19 139 lb (63 kg)     Exam:    Vital Signs:  Wt 139 lb (63 kg)   LMP  (LMP Unknown)   BMI 27.15 kg/m     Physical Exam  Constitutional: She is oriented to person, place, and time and well-developed, well-nourished, and in no distress.  Pulmonary/Chest: Effort normal.  Neurological: She is alert and oriented to person, place, and time.  Psychiatric: Mood, memory, affect and judgment normal.    ASSESSMENT & PLAN:     1. Diarrhea, unspecified  type  No diarrhea since 7/12, tolerating bland foods  Continue with increasing in fluids  2. Herpes zoster without complication  Doing better with zovirax cream, feels like the area is improving   COVID-19 Education: The signs and symptoms of COVID-19 were discussed with the patient and how to seek care for testing (follow up with PCP or arrange E-visit).  The importance of social distancing was discussed today.  Patient Risk:   After full review of this patients clinical status, I feel that they are at least moderate risk at this time.  Time:   Today, I have spent 15 minutes/ seconds with the patient with telehealth technology discussing above diagnoses.     Medication Adjustments/Labs and Tests Ordered: Current medicines are reviewed at length with the patient today.  Concerns regarding medicines are outlined above.   Tests Ordered: No orders of the defined types were placed in this encounter.   Medication Changes: No orders of the defined types were placed in this encounter.   Disposition:  Follow up prn  Signed, Minette Brine, FNP

## 2019-02-21 DIAGNOSIS — B029 Zoster without complications: Secondary | ICD-10-CM | POA: Insufficient documentation

## 2019-02-23 ENCOUNTER — Other Ambulatory Visit: Payer: Self-pay

## 2019-02-23 DIAGNOSIS — B029 Zoster without complications: Secondary | ICD-10-CM

## 2019-02-23 MED ORDER — ACYCLOVIR 5 % EX OINT: TOPICAL_OINTMENT | CUTANEOUS | 2 refills | Status: DC

## 2019-02-24 MED FILL — ACYCLOVIR 5% OINTMENT: 5 | 10 days supply | Qty: 15 | Fill #0

## 2019-03-04 MED FILL — ACYCLOVIR 5% OINTMENT: 5 | 15 days supply | Qty: 15 | Fill #1

## 2019-03-17 ENCOUNTER — Other Ambulatory Visit: Payer: Self-pay | Admitting: Internal Medicine

## 2019-03-18 ENCOUNTER — Telehealth: Payer: Self-pay

## 2019-03-18 NOTE — Telephone Encounter (Signed)
Patient had the shingles back in July and every since she has been feeling bad. She has a bad cough, nose running, scratchy sore throat & sneezing.   Allegra every evening  Nasal Spray each day  [place]

## 2019-03-18 NOTE — Telephone Encounter (Signed)
I spoke with patient. She says that she began having a productive cough with green sputum, runny nose with clear nasal drainage, sneezing, sore throat about a week ago. She denies fever, body aches, chills, headaches. She is using her prescribed medications. Please advise on further instructions and thank you.

## 2019-03-18 NOTE — Telephone Encounter (Signed)
Patient informed and advised of recommendations. She denied any COVID contacts. I let her know to call back if she is still not doing any better.

## 2019-03-18 NOTE — Telephone Encounter (Signed)
-   continue allegra daily.  Continue astelin twice a day for nasal drainage.  Should only be using nasonex 3 times a day to decrease risk of nosebleeds.   - recommend she take mucinex DM 2 tablets 1-2 times a day with plenty of water to help mobilize mucus and help with cough suppressant - continue to monitor for fevers.   - has she been around any known exposures for Covid?  If symptoms not improving with above recommendations she should have Covid testing done.

## 2019-03-19 DIAGNOSIS — Z03818 Encounter for observation for suspected exposure to other biological agents ruled out: Secondary | ICD-10-CM | POA: Diagnosis not present

## 2019-03-22 ENCOUNTER — Other Ambulatory Visit: Payer: Self-pay

## 2019-03-22 ENCOUNTER — Encounter: Payer: Self-pay | Admitting: Allergy

## 2019-03-22 ENCOUNTER — Ambulatory Visit (INDEPENDENT_AMBULATORY_CARE_PROVIDER_SITE_OTHER): Payer: Medicare Other | Admitting: Allergy

## 2019-03-22 DIAGNOSIS — H1013 Acute atopic conjunctivitis, bilateral: Secondary | ICD-10-CM

## 2019-03-22 DIAGNOSIS — R1110 Vomiting, unspecified: Secondary | ICD-10-CM

## 2019-03-22 DIAGNOSIS — J3089 Other allergic rhinitis: Secondary | ICD-10-CM | POA: Diagnosis not present

## 2019-03-22 DIAGNOSIS — R197 Diarrhea, unspecified: Secondary | ICD-10-CM

## 2019-03-22 MED ORDER — ONDANSETRON 4 MG PO TBDP: 4.0000 mg | ORAL_TABLET | Freq: Three times a day (TID) | ORAL | 0 refills | Status: DC | PRN

## 2019-03-22 MED FILL — ONDANSETRON ODT 4 MG TABLET: 4 | 10 days supply | Qty: 30 | Fill #0

## 2019-03-22 NOTE — Progress Notes (Signed)
RE: Anna Hoffman MRN: [id number] DOB: [birth year] Date of Telemedicine Visit: 03/22/2019  Referring provider: Glendale Chard, MD Primary care provider: Glendale Chard, MD  Chief Complaint: vomiting and diarrhea   Telemedicine Follow Up Visit via Telephone: I connected with Aamya Orellana for a follow up on 03/22/19 by telephone and verified that I am speaking with the correct person using two identifiers.   I discussed the limitations, risks, security and privacy concerns of performing an evaluation and management service by telephone and the availability of in person appointments. I also discussed with the patient that there may be a patient responsible charge related to this service. The patient expressed understanding and agreed to proceed.  Patient is at home. Provider is at the office.  Visit start time: 1136 Visit end time: [place] consent/check in by: Lupita Raider Medical consent and medical assistant/nurse: Girtha Rm  History of Present Illness: She is a 84 y.o. female, who is being followed for allergic rhinitis with conjunctivitis.  She had a recent shingles outbreak earlier this summer.. Her previous allergy visit via telemedicine was on February 10, 2019 with Dr. Nelva Bush. She states she is not felt good since she developed the shingles earlier this summer.  She stopped using a bee valacyclovir as she was having side effects related to this including dizziness and vomiting with her meals.  She was then changed to topical acyclovir.  However she continues to have these episodes of vomiting and diarrhea.  She called in today as she states that she developed vomiting and diarrhea yesterday.  She states she had baked potato yesterday.  She did take Imodium which helped the diarrhea subsided.  She does report loose watery stools.  She also took her last dose of Zofran yesterday when she was nauseous but states she did not have vomiting.  She was able this morning to eat some toast and has  been able to keep that down.  She denies any fevers. Last week she did call into the office stating that she had a cough and runny nose with a scratchy sore throat and sneezing.  I advised that she try use of Mucinex to help with any drainage however she feels like the Mucinex is making her cough worse.  However today she states she has not had any more of the runny nose with a scratchy throat or the sneezing.  She is using the Astelin and does feel like it helps to a degree. She states that her church was doing free COVID testing over the weekend that she did go and get tested and her results are pending.  She states that they are to return in 4 to 5 days.    Assessment and Plan: Chantell is a 84 y.o. female with:   Vomiting with diarrhea  -Unclear of etiology at this time.    -Continue a bland diet  -She has Imodium for as needed use for diarrhea  -We will refill her Zofran 4 mg ODT as needed every 8 hours for nausea/vomiting  -If symptoms continue recommend she discuss with her PCP as she may need stool studies performed or further work-up  -Awaiting COVID-19 test results  Allergies   - continue avoidance measures for grass pollen, tree pollen, mold, dust mite and dog  - I do not feel at this time that she has a sinus infection that warrants antibiotic needs.  - continue daily Zyrtec 10mg   - continue performing nasal saline rinse 1-2 times a day to  help flush out sinuses  - for nasal drainage use Astelin (azelastine) 2 sprays each nostril twice a day.   - for nasal congestion if needed use OTC mometasone 2 sprays each nostril 3 times a week (M,W,F) to help decrease risk of nosebleeds  - for itchy/watery/red eyes use Pazeo 1 drop each eye daily as needed  - continue good hand hygiene with soap and water, scrub hands together at least 20 seconds  Shingles  -Resolving.  Continue recommended use of Zovirax topically  -Recommend she get the shingles vaccine  -She has been under a lot of stress  the past several months and she is aware that this may continue to exacerbate symptoms  Follow-up 3-4 months  Diagnostics: None.  Medication List:  Current Outpatient Medications  Medication Sig Dispense Refill  . acetaminophen (TYLENOL) 325 MG tablet Take 2 tablets (650 mg total) by mouth every 6 (six) hours as needed for mild pain (or Fever >/= 101).    Marland Kitchen acyclovir ointment (ZOVIRAX) 5 % Apply thin layer to affected area 15 g 2  . amLODipine (NORVASC) 10 MG tablet TAKE 1 TABLET BY MOUTH ONCE DAILY (Patient taking differently: Take 10 mg by mouth daily. ) 90 tablet 2  . Ascorbic Acid (VITAMIN C) 1000 MG tablet Take 1,000 mg by mouth daily.    Marland Kitchen aspirin EC 81 MG tablet Take 81 mg by mouth daily.    Marland Kitchen azelastine (ASTELIN) 0.1 % nasal spray Place 2 sprays into both nostrils 2 (two) times daily. Use in each nostril as directed 30 mL 5  . Carboxymethylcellul-Glycerin (LUBRICATING EYE DROPS OP) Apply to eye.    . Eluxadoline (VIBERZI) 75 MG TABS Take 75 mg by mouth daily. 30 tablet 0  . Fexofenadine HCl (ALLEGRA PO) Take by mouth.    . irbesartan (AVAPRO) 150 MG tablet TAKE 1 TABLET BY MOUTH EVERY DAY. 90 tablet 1  . loperamide (IMODIUM) 2 MG capsule Take 1 capsule (2 mg total) by mouth 4 (four) times daily as needed for diarrhea or loose stools. 6 capsule 0  . Multiple Vitamins-Minerals (STRESS TAB NF PO) Take 1 tablet by mouth daily.    Marland Kitchen omeprazole (PRILOSEC) 20 MG capsule TAKE 1 CAPSULE BY MOUTH ONCE DAILY 30 MINUTES TO 1 HOUR BEFORE A MEAL (Patient taking differently: Take 20 mg by mouth daily. ) 90 capsule 2  . ondansetron (ZOFRAN-ODT) 4 MG disintegrating tablet Take 1 tablet (4 mg total) by mouth every 8 (eight) hours as needed for nausea or vomiting. 30 tablet 0  . rosuvastatin (CRESTOR) 20 MG tablet TAKE 1 TABLET BY MOUTH ONCE DAILY (Patient taking differently: Take 20 mg by mouth daily. ) 90 tablet 2  . trolamine salicylate (ASPERCREME) 10 % cream Apply 1 application topically as needed  for muscle pain.    . vitamin B-12 (CYANOCOBALAMIN) 1000 MCG tablet Take 1,000 mcg by mouth daily.    Marland Kitchen glucose blood (ONETOUCH VERIO) test strip 1 each by Other route as needed for other. Use as instructed to check blood pressure 2 times per day dx: e11.65 100 each 11   No current facility-administered medications for this visit.    Allergies: Allergies  Allergen Reactions  . Amoxicillin Diarrhea    Has patient had a PCN reaction causing immediate rash, facial/tongue/throat swelling, SOB or lightheadedness with hypotension: no Has patient had a PCN reaction causing severe rash involving mucus membranes or skin necrosis: no Has patient had a PCN reaction that required hospitalization: no pharmacist consult  Has patient had a PCN reaction occurring within the last 10 years: yes If all of the above answers are "NO", then may proceed with Cephalosporin use.   . Ampicillin Rash    Has patient had a PCN reaction causing immediate rash, facial/tongue/throat swelling, SOB or lightheadedness with hypotension: No Has patient had a PCN reaction causing severe rash involving mucus membranes or skin necrosis: No Has patient had a PCN reaction that required hospitalization No Has patient had a PCN reaction occurring within the last 10 years: No If all of the above answers are "NO", then may proceed with Cephalosporin use.   . Sulfa Antibiotics Rash   I reviewed her past medical history, social history, family history, and environmental history and no significant changes have been reported from previous visit on February 10, 2019.  Review of Systems  Constitutional: Negative for chills and fever.  HENT: Positive for congestion, rhinorrhea, sneezing and sore throat. Negative for sinus pressure and sinus pain.   Eyes: Negative for discharge, redness and itching.  Respiratory: Positive for cough. Negative for chest tightness, shortness of breath and wheezing.   Cardiovascular: Negative.   Gastrointestinal:  Positive for diarrhea, nausea and vomiting.  Musculoskeletal: Negative for myalgias.  Skin: Negative for rash.  Neurological: Negative for headaches.   Objective: Physical Exam Not obtained as encounter was done via telephone.   Previous notes and tests were reviewed.  I discussed the assessment and treatment plan with the patient. The patient was provided an opportunity to ask questions and all were answered. The patient agreed with the plan and demonstrated an understanding of the instructions.   The patient was advised to call back or seek an in-person evaluation if the symptoms worsen or if the condition fails to improve as anticipated.  I provided 35 minutes of non-face-to-face time during this encounter.  It was my pleasure to participate in [place]'s care today. Please feel free to contact me with any questions or concerns.   Sincerely,  Labarron Durnin Charmian Muff, MD

## 2019-03-22 NOTE — Patient Instructions (Signed)
Vomiting with diarrhea  -Unclear of etiology at this time.    -Continue a bland diet  -She has Imodium for as needed use for diarrhea  -We will refill her Zofran 4 mg ODT as needed every 8 hours for nausea/vomiting  -If symptoms continue recommend she discuss with her PCP as she may need stool studies performed or further work-up  -Awaiting COVID-19 test results  Allergies   - continue avoidance measures for grass pollen, tree pollen, mold, dust mite and dog  - I do not feel at this time that she has a sinus infection that warrants antibiotic needs.  - continue daily Zyrtec 10mg   - continue performing nasal saline rinse 1-2 times a day to help flush out sinuses  - for nasal drainage use Astelin (azelastine) 2 sprays each nostril twice a day.   - for nasal congestion if needed use OTC mometasone 2 sprays each nostril 3 times a week (M,W,F) to help decrease risk of nosebleeds  - for itchy/watery/red eyes use Pazeo 1 drop each eye daily as needed  - continue good hand hygiene with soap and water, scrub hands together at least 20 seconds  Shingles  -Resolving.  Continue recommended use of Zovirax topically  Follow-up 3-4 months

## 2019-03-24 ENCOUNTER — Ambulatory Visit
Admission: RE | Admit: 2019-03-24 | Discharge: 2019-03-24 | Disposition: A | Discharge status: Home / Self Care | Payer: Medicare Other | Source: Ambulatory Visit | Attending: Internal Medicine | Admitting: Internal Medicine

## 2019-03-24 ENCOUNTER — Ambulatory Visit (INDEPENDENT_AMBULATORY_CARE_PROVIDER_SITE_OTHER): Payer: Medicare Other | Admitting: Internal Medicine

## 2019-03-24 ENCOUNTER — Other Ambulatory Visit: Payer: Self-pay

## 2019-03-24 VITALS — BP 140/80 | HR 97 | Temp 98.2°F | Ht 60.0 in | Wt 139.2 lb

## 2019-03-24 DIAGNOSIS — R5383 Other fatigue: Secondary | ICD-10-CM

## 2019-03-24 DIAGNOSIS — R6889 Other general symptoms and signs: Secondary | ICD-10-CM | POA: Diagnosis not present

## 2019-03-24 DIAGNOSIS — R059 Cough, unspecified: Secondary | ICD-10-CM

## 2019-03-24 DIAGNOSIS — R05 Cough: Secondary | ICD-10-CM

## 2019-03-24 DIAGNOSIS — R0981 Nasal congestion: Secondary | ICD-10-CM

## 2019-03-24 NOTE — Progress Notes (Signed)
Subjective:     Patient ID: Anna Hoffman , Anna Hoffman    DOB: [birth year] , 84 y.o.   MRN: [id number]   Chief Complaint  Patient presents with  . Fatigue  *  HPI Has been having nose congestion and cough x1 week. Has been feeling fatigued. Cough is productive with green mucous. Had been outside in the sun and caused her allergies to act up when she goes inside the house.Has been doing saline rinses before bed time and her cough gets worse qhs.  Has been laying around in the past 3 weeks. Had negative Covid test 5 days ago. In the past 3 weeks she had shingles and gastroenteritis with dehydration. No GI symptoms in 3 days.    Past Medical History:  Diagnosis Date  . Diabetes mellitus without complication ([place])   . Hypertension   . Hypokalemia   . Shingles   . Stroke [facility])      Family History  Problem Relation Age of Onset  . Alzheimer's disease Mother   . Prostate cancer Father   . Brain cancer Brother   . Brain cancer Brother   . Allergic rhinitis Neg Hx   . Asthma Neg Hx   . Eczema Neg Hx   . Urticaria Neg Hx      Current Outpatient Medications:  .  acetaminophen (TYLENOL) 325 MG tablet, Take 2 tablets (650 mg total) by mouth every 6 (six) hours as needed for mild pain (or Fever >/= 101)., Disp: , Rfl:  .  acyclovir ointment (ZOVIRAX) 5 %, Apply thin layer to affected area, Disp: 15 g, Rfl: 2 .  amLODipine (NORVASC) 10 MG tablet, TAKE 1 TABLET BY MOUTH ONCE DAILY (Patient taking differently: Take 10 mg by mouth daily. ), Disp: 90 tablet, Rfl: 2 .  Ascorbic Acid (VITAMIN C) 1000 MG tablet, Take 1,000 mg by mouth daily., Disp: , Rfl:  .  aspirin EC 81 MG tablet, Take 81 mg by mouth daily., Disp: , Rfl:  .  azelastine (ASTELIN) 0.1 % nasal spray, Place 2 sprays into both nostrils 2 (two) times daily. Use in each nostril as directed, Disp: 30 mL, Rfl: 5 .  Carboxymethylcellul-Glycerin (LUBRICATING EYE DROPS OP), Apply to eye., Disp: , Rfl:  .  Eluxadoline (VIBERZI) 75 MG TABS,  Take 75 mg by mouth daily., Disp: 30 tablet, Rfl: 0 .  Fexofenadine HCl (ALLEGRA PO), Take by mouth., Disp: , Rfl:  .  glucose blood (ONETOUCH VERIO) test strip, 1 each by Other route as needed for other. Use as instructed to check blood pressure 2 times per day dx: e11.65, Disp: 100 each, Rfl: 11 .  irbesartan (AVAPRO) 150 MG tablet, TAKE 1 TABLET BY MOUTH EVERY DAY., Disp: 90 tablet, Rfl: 1 .  loperamide (IMODIUM) 2 MG capsule, Take 1 capsule (2 mg total) by mouth 4 (four) times daily as needed for diarrhea or loose stools., Disp: 6 capsule, Rfl: 0 .  Multiple Vitamins-Minerals (STRESS TAB NF PO), Take 1 tablet by mouth daily., Disp: , Rfl:  .  omeprazole (PRILOSEC) 20 MG capsule, TAKE 1 CAPSULE BY MOUTH ONCE DAILY 30 MINUTES TO 1 HOUR BEFORE A MEAL (Patient taking differently: Take 20 mg by mouth daily. ), Disp: 90 capsule, Rfl: 2 .  ondansetron (ZOFRAN-ODT) 4 MG disintegrating tablet, Take 1 tablet (4 mg total) by mouth every 8 (eight) hours as needed for nausea or vomiting., Disp: 30 tablet, Rfl: 0 .  rosuvastatin (CRESTOR) 20 MG tablet, TAKE 1 TABLET BY  MOUTH ONCE DAILY (Patient taking differently: Take 20 mg by mouth daily. ), Disp: 90 tablet, Rfl: 2 .  trolamine salicylate (ASPERCREME) 10 % cream, Apply 1 application topically as needed for muscle pain., Disp: , Rfl:  .  vitamin B-12 (CYANOCOBALAMIN) 1000 MCG tablet, Take 1,000 mcg by mouth daily., Disp: , Rfl:    Allergies  Allergen Reactions  . Amoxicillin Diarrhea    Has patient had a PCN reaction causing immediate rash, facial/tongue/throat swelling, SOB or lightheadedness with hypotension: no Has patient had a PCN reaction causing severe rash involving mucus membranes or skin necrosis: no Has patient had a PCN reaction that required hospitalization: no pharmacist consult Has patient had a PCN reaction occurring within the last 10 years: yes If all of the above answers are "NO", then may proceed with Cephalosporin use.   . Ampicillin  Rash    Has patient had a PCN reaction causing immediate rash, facial/tongue/throat swelling, SOB or lightheadedness with hypotension: No Has patient had a PCN reaction causing severe rash involving mucus membranes or skin necrosis: No Has patient had a PCN reaction that required hospitalization No Has patient had a PCN reaction occurring within the last 10 years: No If all of the above answers are "NO", then may proceed with Cephalosporin use.   . Sulfa Antibiotics Rash     Review of Systems  + fatigue and cough, always gets L ankle swelling when she wears L knee brace. Rest neg.  Today's Vitals   03/24/19 1033  BP: 140/80  Pulse: 97  Temp: 98.2 F (36.8 C)  TempSrc: Oral  SpO2: 95%  Weight: 139 lb 3.2 oz (63.1 kg)  Height: 5' (1.524 m)  PainSc: 2   PainLoc: Back   Body mass index is 27.19 kg/m.   Objective:  Physical Exam  Alert pt NAD who seems nasally congested EYES- non icterus, mild watering, no purulent drainage NOSE- moderate mucosa congestion which is pale pink with clear mucous. No sinus tenderness TM- both gray and little dull, canals are normal PHARYNX- clear, clear drainage noted.  NECK- supple with no nodes LUNGS- clear HEART - RRR with no murmurs SKIN- non jaundiced, no rashes.  ABD_ + BS, soft, no masses, tenderness or HSM     Assessment And Plan:     1. Other fatigue- - CMP14 + Anion Gap - CBC no Diff - T4, Free  2. Cold intolerance - TSH - T4, Free 3- Cough- more likely from allergies.      CXR ordered  Fredrick Dray RODRIGUEZ-SOUTHWORTH, PA-C    THE PATIENT IS ENCOURAGED TO PRACTICE SOCIAL DISTANCING DUE TO THE COVID-19 PANDEMIC.

## 2019-03-25 ENCOUNTER — Telehealth: Payer: Self-pay

## 2019-03-25 LAB — CBC
Hematocrit: 38 % (ref 34.0–46.6)
Hemoglobin: 13 g/dL (ref 11.1–15.9)
MCH: 29.3 pg (ref 26.6–33.0)
MCHC: 34.2 g/dL (ref 31.5–35.7)
MCV: 86 fL (ref 79–97)
Platelets: 374 10*3/uL (ref 150–450)
RBC: 4.44 x10E6/uL (ref 3.77–5.28)
RDW: 13.1 % (ref 11.7–15.4)
WBC: 7.4 10*3/uL (ref 3.4–10.8)

## 2019-03-25 LAB — CMP14 + ANION GAP
ALT: 13 IU/L (ref 0–32)
AST: 32 IU/L (ref 0–40)
Albumin/Globulin Ratio: 1.7 (ref 1.2–2.2)
Albumin: 4.5 g/dL (ref 3.6–4.6)
Alkaline Phosphatase: 101 IU/L (ref 39–117)
Anion Gap: 21 mmol/L — ABNORMAL HIGH (ref 10.0–18.0)
BUN/Creatinine Ratio: 13 (ref 12–28)
BUN: 16 mg/dL (ref 8–27)
Bilirubin Total: 0.4 mg/dL (ref 0.0–1.2)
CO2: 19 mmol/L — ABNORMAL LOW (ref 20–29)
Calcium: 9.4 mg/dL (ref 8.7–10.3)
Chloride: 103 mmol/L (ref 96–106)
Creatinine, Ser: 1.2 mg/dL — ABNORMAL HIGH (ref 0.57–1.00)
GFR calc Af Amer: 49 mL/min/{1.73_m2} — ABNORMAL LOW (ref >59)
GFR calc non Af Amer: 42 mL/min/{1.73_m2} — ABNORMAL LOW (ref >59)
Globulin, Total: 2.7 g/dL (ref 1.5–4.5)
Glucose: 118 mg/dL — ABNORMAL HIGH (ref 65–99)
Potassium: 4.3 mmol/L (ref 3.5–5.2)
Sodium: 143 mmol/L (ref 134–144)
Total Protein: 7.2 g/dL (ref 6.0–8.5)

## 2019-03-25 LAB — TSH: TSH: 1.05 u[IU]/mL (ref 0.450–4.500)

## 2019-03-25 LAB — T4, FREE: Free T4: 1.14 ng/dL (ref 0.82–1.77)

## 2019-03-25 NOTE — Telephone Encounter (Signed)
-----   Message from [name], PA-C sent at 03/25/2019  8:30 AM EDT ----- Will you please call pt and make sure she reads her mychart message. Thanks,       Anna Hoffman

## 2019-03-25 NOTE — Telephone Encounter (Signed)
RESULTS GIVEN TO PATIENT

## 2019-03-31 ENCOUNTER — Telehealth: Payer: Self-pay | Admitting: Internal Medicine

## 2019-03-31 ENCOUNTER — Encounter: Payer: Self-pay | Admitting: Internal Medicine

## 2019-03-31 NOTE — Telephone Encounter (Signed)
I called pt to see how she is doing. She feels much better, though her cough is still present, but no fever or fatigue. Has been doing saline nose rinses in the am, so I asked her to do them bid, second one at 5 pm for a few days and let us know if the cough persists. She agreed and was thankful I checked on her.

## 2019-04-05 ENCOUNTER — Telehealth: Payer: Self-pay

## 2019-04-05 NOTE — Telephone Encounter (Signed)
The pt was notified that Dr. Baird Cancer wants her to have lab work done and that it has to be drawn at 8:30.  The pt said that she would come fasting and that she would come back later in the day for her 2 pm physical

## 2019-04-06 ENCOUNTER — Other Ambulatory Visit: Payer: Self-pay

## 2019-04-06 ENCOUNTER — Ambulatory Visit (INDEPENDENT_AMBULATORY_CARE_PROVIDER_SITE_OTHER): Payer: Medicare Other

## 2019-04-06 ENCOUNTER — Encounter: Payer: Self-pay | Admitting: Internal Medicine

## 2019-04-06 ENCOUNTER — Ambulatory Visit (INDEPENDENT_AMBULATORY_CARE_PROVIDER_SITE_OTHER): Payer: Medicare Other | Admitting: Internal Medicine

## 2019-04-06 VITALS — BP 142/78 | HR 100 | Temp 98.3°F | Ht 63.2 in | Wt 139.6 lb

## 2019-04-06 DIAGNOSIS — L299 Pruritus, unspecified: Secondary | ICD-10-CM | POA: Diagnosis not present

## 2019-04-06 DIAGNOSIS — I129 Hypertensive chronic kidney disease with stage 1 through stage 4 chronic kidney disease, or unspecified chronic kidney disease: Secondary | ICD-10-CM | POA: Diagnosis not present

## 2019-04-06 DIAGNOSIS — R600 Localized edema: Secondary | ICD-10-CM

## 2019-04-06 DIAGNOSIS — Z0000 Encounter for general adult medical examination without abnormal findings: Secondary | ICD-10-CM

## 2019-04-06 DIAGNOSIS — N183 Chronic kidney disease, stage 3 unspecified: Secondary | ICD-10-CM

## 2019-04-06 DIAGNOSIS — I5189 Other ill-defined heart diseases: Secondary | ICD-10-CM

## 2019-04-06 DIAGNOSIS — M79672 Pain in left foot: Secondary | ICD-10-CM

## 2019-04-06 DIAGNOSIS — R531 Weakness: Secondary | ICD-10-CM | POA: Diagnosis not present

## 2019-04-06 DIAGNOSIS — R2689 Other abnormalities of gait and mobility: Secondary | ICD-10-CM | POA: Diagnosis not present

## 2019-04-06 DIAGNOSIS — E1122 Type 2 diabetes mellitus with diabetic chronic kidney disease: Secondary | ICD-10-CM | POA: Diagnosis not present

## 2019-04-06 DIAGNOSIS — Z79899 Other long term (current) drug therapy: Secondary | ICD-10-CM

## 2019-04-06 DIAGNOSIS — F419 Anxiety disorder, unspecified: Secondary | ICD-10-CM

## 2019-04-06 MED FILL — ACYCLOVIR 5% OINTMENT: 5 | 15 days supply | Qty: 15 | Fill #2

## 2019-04-06 NOTE — Patient Instructions (Signed)
Health Maintenance, Female Adopting a healthy lifestyle and getting preventive care are important in promoting health and wellness. Ask your health care provider about:  The right schedule for you to have regular tests and exams.  Things you can do on your own to prevent diseases and keep yourself healthy. What should I know about diet, weight, and exercise? Eat a healthy diet   Eat a diet that includes plenty of vegetables, fruits, low-fat dairy products, and lean protein.  Do not eat a lot of foods that are high in solid fats, added sugars, or sodium. Maintain a healthy weight Body mass index (BMI) is used to identify weight problems. It estimates body fat based on height and weight. Your health care provider can help determine your BMI and help you achieve or maintain a healthy weight. Get regular exercise Get regular exercise. This is one of the most important things you can do for your health. Most adults should:  Exercise for at least 150 minutes each week. The exercise should increase your heart rate and make you sweat (moderate-intensity exercise).  Do strengthening exercises at least twice a week. This is in addition to the moderate-intensity exercise.  Spend less time sitting. Even light physical activity can be beneficial. Watch cholesterol and blood lipids Have your blood tested for lipids and cholesterol at 84 years of age, then have this test every 5 years. Have your cholesterol levels checked more often if:  Your lipid or cholesterol levels are high.  You are older than 82 years of age.  You are at high risk for heart disease. What should I know about cancer screening? Depending on your health history and family history, you may need to have cancer screening at various ages. This may include screening for:  Breast cancer.  Cervical cancer.  Colorectal cancer.  Skin cancer.  Lung cancer. What should I know about heart disease, diabetes, and high blood  pressure? Blood pressure and heart disease  High blood pressure causes heart disease and increases the risk of stroke. This is more likely to develop in people who have high blood pressure readings, are of African descent, or are overweight.  Have your blood pressure checked: ? Every 3-5 years if you are 18-39 years of age. ? Every year if you are 40 years old or older. Diabetes Have regular diabetes screenings. This checks your fasting blood sugar level. Have the screening done:  Once every three years after age 40 if you are at a normal weight and have a low risk for diabetes.  More often and at a younger age if you are overweight or have a high risk for diabetes. What should I know about preventing infection? Hepatitis B If you have a higher risk for hepatitis B, you should be screened for this virus. Talk with your health care provider to find out if you are at risk for hepatitis B infection. Hepatitis C Testing is recommended for:  Everyone born from 1945 through 1965.  Anyone with known risk factors for hepatitis C. Sexually transmitted infections (STIs)  Get screened for STIs, including gonorrhea and chlamydia, if: ? You are sexually active and are younger than 82 years of age. ? You are older than 82 years of age and your health care provider tells you that you are at risk for this type of infection. ? Your sexual activity has changed since you were last screened, and you are at increased risk for chlamydia or gonorrhea. Ask your health care provider if   you are at risk.  Ask your health care provider about whether you are at high risk for HIV. Your health care provider may recommend a prescription medicine to help prevent HIV infection. If you choose to take medicine to prevent HIV, you should first get tested for HIV. You should then be tested every 3 months for as long as you are taking the medicine. Pregnancy  If you are about to stop having your period (premenopausal) and  you may become pregnant, seek counseling before you get pregnant.  Take 400 to 800 micrograms (mcg) of folic acid every day if you become pregnant.  Ask for birth control (contraception) if you want to prevent pregnancy. Osteoporosis and menopause Osteoporosis is a disease in which the bones lose minerals and strength with aging. This can result in bone fractures. If you are 65 years old or older, or if you are at risk for osteoporosis and fractures, ask your health care provider if you should:  Be screened for bone loss.  Take a calcium or vitamin D supplement to lower your risk of fractures.  Be given hormone replacement therapy (HRT) to treat symptoms of menopause. Follow these instructions at home: Lifestyle  Do not use any products that contain nicotine or tobacco, such as cigarettes, e-cigarettes, and chewing tobacco. If you need help quitting, ask your health care provider.  Do not use street drugs.  Do not share needles.  Ask your health care provider for help if you need support or information about quitting drugs. Alcohol use  Do not drink alcohol if: ? Your health care provider tells you not to drink. ? You are pregnant, may be pregnant, or are planning to become pregnant.  If you drink alcohol: ? Limit how much you use to 0-1 drink a day. ? Limit intake if you are breastfeeding.  Be aware of how much alcohol is in your drink. In the [place] one drink equals one 12 oz bottle of beer (355 mL), one 5 oz glass of wine (148 mL), or one 1 oz glass of hard liquor (44 mL). General instructions  Schedule regular health, dental, and eye exams.  Stay current with your vaccines.  Tell your health care provider if: ? You often feel depressed. ? You have ever been abused or do not feel safe at home. Summary  Adopting a healthy lifestyle and getting preventive care are important in promoting health and wellness.  Follow your health care provider's instructions about healthy  diet, exercising, and getting tested or screened for diseases.  Follow your health care provider's instructions on monitoring your cholesterol and blood pressure. This information is not intended to replace advice given to you by your health care provider. Make sure you discuss any questions you have with your health care provider. Document Released: 02/03/2011 Document Revised: 07/14/2018 Document Reviewed: 07/14/2018 Elsevier Patient Education  2020 Elsevier Inc.  

## 2019-04-06 NOTE — Progress Notes (Signed)
Subjective:   Annalyce Lanpher is a 84 y.o. female who presents for Medicare Annual (Subsequent) preventive examination.  Review of Systems:  n/a Cardiac Risk Factors include: advanced age (>55men, >9 women);diabetes mellitus;dyslipidemia;hypertension     Objective:     Vitals: BP (!) 142/78 (BP Location: Left Arm, Patient Position: Sitting, Cuff Size: Normal)   Pulse 100   Temp 98.3 F (36.8 C) (Oral)   Ht 5' 3.2" (1.605 m)   Wt 139 lb 9.6 oz (63.3 kg)   LMP  (LMP Unknown)   BMI 24.57 kg/m   Body mass index is 24.57 kg/m.  Advanced Directives 04/06/2019 02/13/2019 09/01/2018 11/07/2017 07/03/2016 06/27/2016 06/27/2016  Does Patient Have a Medical Advance Directive? Yes No No No No No No  Type of Paramedic of [place];Living will - - - - - -  Does patient want to make changes to medical advance directive? - - - - - - -  Copy of [place] in Chart? No - copy requested - - - - - -  Would patient like information on creating a medical advance directive? - - No - Patient declined No - Patient declined No - Patient declined No - Patient declined -    Tobacco Social History   Tobacco Use  Smoking Status Never Smoker  Smokeless Tobacco Never Used     Counseling given: Not Answered   Clinical Intake:  Pre-visit preparation completed: Yes  Pain : No/denies pain     Nutritional Status: BMI of 19-24  Normal Nutritional Risks: None Diabetes: Yes CBG done?: No Did pt. bring in CBG monitor from home?: No  How often do you need to have someone help you when you read instructions, pamphlets, or other written materials from your doctor or pharmacy?: 1 - Never What is the last grade level you completed in school?: 12th grade  Interpreter Needed?: No  Information entered by :: NAllen LPN  Past Medical History:  Diagnosis Date  . Diabetes mellitus without complication ([place])   . Hypertension   . Hypokalemia   . Shingles   .  Stroke [facility])    Past Surgical History:  Procedure Laterality Date  . ABDOMINAL HYSTERECTOMY    . BREAST EXCISIONAL BIOPSY Left   . ESOPHAGOGASTRODUODENOSCOPY N/A 09/01/2014   Procedure: ESOPHAGOGASTRODUODENOSCOPY (EGD);  Surgeon: Lear Ng, MD;  Location: [facility];  Service: Endoscopy;  Laterality: N/A;  . HIATAL HERNIA REPAIR N/A 09/04/2014   Procedure: LAPAROSCOPIC REPAIR OF HIATAL HERNIA;  Surgeon: Excell Seltzer, MD;  Location: [facility];  Service: General;  Laterality: N/A;  With MESH  . IR GENERIC HISTORICAL  04/10/2016   IR US GUIDE VASC ACCESS RIGHT 04/10/2016 Corrie Mckusick, DO [facility]  . IR GENERIC HISTORICAL  04/10/2016   IR ANGIOGRAM SELECTIVE EACH ADDITIONAL VESSEL 04/10/2016 Corrie Mckusick, DO [facility]  . IR GENERIC HISTORICAL  04/10/2016   IR ANGIOGRAM SELECTIVE EACH ADDITIONAL VESSEL 04/10/2016 Corrie Mckusick, DO [facility]  . IR GENERIC HISTORICAL  04/10/2016   IR EMBO TUMOR ORGAN ISCHEMIA INFARCT INC GUIDE ROADMAPPING 04/10/2016 Corrie Mckusick, DO [facility]  . IR GENERIC HISTORICAL  04/10/2016   IR ANGIOGRAM SELECTIVE EACH ADDITIONAL VESSEL 04/10/2016 Corrie Mckusick, DO [facility]  . IR GENERIC HISTORICAL  04/10/2016   IR ANGIOGRAM SELECTIVE EACH ADDITIONAL VESSEL 04/10/2016 Corrie Mckusick, DO [facility]  . IR GENERIC HISTORICAL  04/10/2016   IR RENAL SELECTIVE  UNI INC S&I MOD SED 04/10/2016 Corrie Mckusick, DO  [facility]  . IR GENERIC HISTORICAL  03/06/2016   IR RADIOLOGIST EVAL & MGMT 03/06/2016 Corrie Mckusick, DO [facility]  . IR GENERIC HISTORICAL  03/26/2016   IR RADIOLOGIST EVAL & MGMT 03/26/2016 Corrie Mckusick, DO [facility]  . IR GENERIC HISTORICAL  04/29/2016   IR RADIOLOGIST EVAL & MGMT 04/29/2016 [facility]  . IR RADIOLOGIST EVAL & MGMT  11/12/2016  . IR RADIOLOGIST EVAL & MGMT  12/16/2017  . KNEE SURGERY    . TONSILLECTOMY     Family History  Problem Relation Age of Onset  . Alzheimer's disease Mother   . Prostate cancer Father   . Brain cancer  Brother   . Brain cancer Brother   . Allergic rhinitis Neg Hx   . Asthma Neg Hx   . Eczema Neg Hx   . Urticaria Neg Hx    Social History   Socioeconomic History  . Marital status: Widowed    Spouse name: Not on file  . Number of children: 2  . Years of education: Not on file  . Highest education level: Not on file  Occupational History  . Occupation: retired  Scientific laboratory technician  . Financial resource strain: Not hard at all  . Food insecurity    Worry: Never true    Inability: Never true  . Transportation needs    Medical: No    Non-medical: No  Tobacco Use  . Smoking status: Never Smoker  . Smokeless tobacco: Never Used  Substance and Sexual Activity  . Alcohol use: No  . Drug use: No  . Sexual activity: Not Currently    Birth control/protection: Surgical  Lifestyle  . Physical activity    Days per week: 7 days    Minutes per session: 20 min  . Stress: Not at all  Relationships  . Social Herbalist on phone: Not on file    Gets together: Not on file    Attends religious service: Not on file    Active member of club or organization: Not on file    Attends meetings of clubs or organizations: Not on file    Relationship status: Not on file  Other Topics Concern  . Not on file  Social History Narrative   Son [name] lives with her    Outpatient Encounter Medications as of 04/06/2019  Medication Sig  . acetaminophen (TYLENOL) 325 MG tablet Take 2 tablets (650 mg total) by mouth every 6 (six) hours as needed for mild pain (or Fever >/= 101).  Marland Kitchen acyclovir ointment (ZOVIRAX) 5 % Apply thin layer to affected area  . amLODipine (NORVASC) 10 MG tablet TAKE 1 TABLET BY MOUTH ONCE DAILY (Patient taking differently: Take 10 mg by mouth daily. )  . Ascorbic Acid (VITAMIN C) 1000 MG tablet Take 1,000 mg by mouth daily.  Marland Kitchen aspirin EC 81 MG tablet Take 81 mg by mouth daily.  Marland Kitchen azelastine (ASTELIN) 0.1 % nasal spray Place 2 sprays into both nostrils 2 (two) times daily. Use  in each nostril as directed  . Carboxymethylcellul-Glycerin (LUBRICATING EYE DROPS OP) Apply to eye.  . Eluxadoline (VIBERZI) 75 MG TABS Take 75 mg by mouth daily.  Marland Kitchen Fexofenadine HCl (ALLEGRA PO) Take by mouth.  Marland Kitchen glucose blood (ONETOUCH VERIO) test strip 1 each by Other route as needed for other. Use as instructed to check blood pressure 2 times per day dx: e11.65  . irbesartan (AVAPRO) 150 MG tablet TAKE 1 TABLET BY  MOUTH EVERY DAY.  Marland Kitchen loperamide (IMODIUM) 2 MG capsule Take 1 capsule (2 mg total) by mouth 4 (four) times daily as needed for diarrhea or loose stools.  . Multiple Vitamins-Minerals (STRESS TAB NF PO) Take 1 tablet by mouth daily.  Marland Kitchen omeprazole (PRILOSEC) 20 MG capsule TAKE 1 CAPSULE BY MOUTH ONCE DAILY 30 MINUTES TO 1 HOUR BEFORE A MEAL (Patient taking differently: Take 20 mg by mouth daily. )  . ondansetron (ZOFRAN-ODT) 4 MG disintegrating tablet Take 1 tablet (4 mg total) by mouth every 8 (eight) hours as needed for nausea or vomiting.  . rosuvastatin (CRESTOR) 20 MG tablet TAKE 1 TABLET BY MOUTH ONCE DAILY (Patient taking differently: Take 20 mg by mouth daily. )  . trolamine salicylate (ASPERCREME) 10 % cream Apply 1 application topically as needed for muscle pain.  . vitamin B-12 (CYANOCOBALAMIN) 1000 MCG tablet Take 1,000 mcg by mouth daily.   No facility-administered encounter medications on file as of 04/06/2019.     Activities of Daily Living In your present state of health, do you have any difficulty performing the following activities: 04/06/2019  Hearing? N  Vision? Y  Comment sometimes blurry and itchy  Difficulty concentrating or making decisions? N  Walking or climbing stairs? Y  Comment due to knees  Dressing or bathing? N  Doing errands, shopping? N  Preparing Food and eating ? N  Using the Toilet? N  In the past six months, have you accidently leaked urine? Y  Do you have problems with loss of bowel control? N  Managing your Medications? N  Managing your  Finances? N  Housekeeping or managing your Housekeeping? N  Some recent data might be hidden    Patient Care Team: Glendale Chard, MD as PCP - General (Internal Medicine)    Assessment:   This is a routine wellness examination for [place].  Exercise Activities and Dietary recommendations Current Exercise Habits: Home exercise routine, Time (Minutes): 20, Frequency (Times/Week): 7, Weekly Exercise (Minutes/Week): 140  Goals    . Patient Stated     04/05/2019, wants to get well and get out of the house       Fall Risk Fall Risk  04/06/2019 03/24/2019 03/24/2019 02/16/2019 02/07/2019  Falls in the past year? 0 0 0 0 0  Number falls in past yr: - - - - -  Injury with Fall? - - - - -  Comment - - - - -  Risk for fall due to : Impaired balance/gait - - - -  Risk for fall due to: Comment - - - - -  Follow up Falls evaluation completed;Education provided;Falls prevention discussed - - - -   Is the patient's home free of loose throw rugs in walkways, pet beds, electrical cords, etc?   yes      Grab bars in the bathroom? no      Handrails on the stairs?   yes      Adequate lighting?   yes  Timed Get Up and Go performed: n/a  Depression Screen PHQ 2/9 Scores 04/06/2019 03/24/2019 02/16/2019 02/07/2019  PHQ - 2 Score 0 0 0 0  PHQ- 9 Score 3 - - -     Cognitive Function     6CIT Screen 04/06/2019  What Year? 0 points  What month? 0 points  What time? 0 points  Count back from 20 0 points  Months in reverse 2 points  Repeat phrase 0 points  Total Score 2    Immunization  History  Administered Date(s) Administered  . Influenza-Unspecified 06/29/2018    Qualifies for Shingles Vaccine? yes  Screening Tests Health Maintenance  Topic Date Due  . TETANUS/TDAP  12/09/1953  . PNA vac Low Risk Adult (2 of 2 - PPSV23) 12/04/2012  . INFLUENZA VACCINE  03/05/2019  . HEMOGLOBIN A1C  10/04/2019  . OPHTHALMOLOGY EXAM  12/20/2019  . FOOT EXAM  04/05/2020  . DEXA SCAN  Completed    Cancer  Screenings: Lung: Low Dose CT Chest recommended if Age 5-80 years, 30 pack-year currently smoking OR have quit w/in 15years. Patient does not qualify. Breast:  Up to date on Mammogram? Yes   Up to date of Bone Density/Dexa? Yes Colorectal: not required  Additional Screenings: : Hepatitis C Screening: n/a     Plan:    Patient goal is to get well and get out of the house.   I have personally reviewed and noted the following in the patient's chart:   . Medical and social history . Use of alcohol, tobacco or illicit drugs  . Current medications and supplements . Functional ability and status . Nutritional status . Physical activity . Advanced directives . List of other physicians . Hospitalizations, surgeries, and ER visits in previous 12 months . Vitals . Screenings to include cognitive, depression, and falls . Referrals and appointments  In addition, I have reviewed and discussed with patient certain preventive protocols, quality metrics, and best practice recommendations. A written personalized care plan for preventive services as well as general preventive health recommendations were provided to patient.     Kellie Simmering, LPN  03/08/276

## 2019-04-06 NOTE — Patient Instructions (Signed)
[name] , Thank you for taking time to come for your Medicare Wellness Visit. I appreciate your ongoing commitment to your health goals. Please review the following plan we discussed and let me know if I can assist you in the future.   Screening recommendations/referrals: Colonoscopy: not required Mammogram: 06/2018 Bone Density: 06/2016 Recommended yearly ophthalmology/optometry visit for glaucoma screening and checkup Recommended yearly dental visit for hygiene and checkup  Vaccinations: Influenza vaccine: 06/2018 Pneumococcal vaccine: 12/2011 Tdap vaccine: postponed due to shingles recovery Shingles vaccine: discussed    Advanced directives: Please bring a copy of your POA (Power of [place]) and/or Living Will to your next appointment.    Conditions/risks identified: HTN  Next appointment: 08/09/2019 at 2:00   Preventive Care 24 Years and Older, Female Preventive care refers to lifestyle choices and visits with your health care provider that can promote health and wellness. What does preventive care include?  A yearly physical exam. This is also called an annual well check.  Dental exams once or twice a year.  Routine eye exams. Ask your health care provider how often you should have your eyes checked.  Personal lifestyle choices, including:  Daily care of your teeth and gums.  Regular physical activity.  Eating a healthy diet.  Avoiding tobacco and drug use.  Limiting alcohol use.  Practicing safe sex.  Taking low-dose aspirin every day.  Taking vitamin and mineral supplements as recommended by your health care provider. What happens during an annual well check? The services and screenings done by your health care provider during your annual well check will depend on your age, overall health, lifestyle risk factors, and family history of disease. Counseling  Your health care provider may ask you questions about your:  Alcohol use.  Tobacco use.  Drug  use.  Emotional well-being.  Home and relationship well-being.  Sexual activity.  Eating habits.  History of falls.  Memory and ability to understand (cognition).  Work and work Statistician.  Reproductive health. Screening  You may have the following tests or measurements:  Height, weight, and BMI.  Blood pressure.  Lipid and cholesterol levels. These may be checked every 5 years, or more frequently if you are over 28 years old.  Skin check.  Lung cancer screening. You may have this screening every year starting at age 64 if you have a 30-pack-year history of smoking and currently smoke or have quit within the past 15 years.  Fecal occult blood test (FOBT) of the stool. You may have this test every year starting at age 80.  Flexible sigmoidoscopy or colonoscopy. You may have a sigmoidoscopy every 5 years or a colonoscopy every 10 years starting at age 79.  Hepatitis C blood test.  Hepatitis B blood test.  Sexually transmitted disease (STD) testing.  Diabetes screening. This is done by checking your blood sugar (glucose) after you have not eaten for a while (fasting). You may have this done every 1-3 years.  Bone density scan. This is done to screen for osteoporosis. You may have this done starting at age 46.  Mammogram. This may be done every 1-2 years. Talk to your health care provider about how often you should have regular mammograms. Talk with your health care provider about your test results, treatment options, and if necessary, the need for more tests. Vaccines  Your health care provider may recommend certain vaccines, such as:  Influenza vaccine. This is recommended every year.  Tetanus, diphtheria, and acellular pertussis (Tdap, Td) vaccine. You  may need a Td booster every 10 years.  Zoster vaccine. You may need this after age 2.  Pneumococcal 13-valent conjugate (PCV13) vaccine. One dose is recommended after age 53.  Pneumococcal polysaccharide  (PPSV23) vaccine. One dose is recommended after age 48. Talk to your health care provider about which screenings and vaccines you need and how often you need them. This information is not intended to replace advice given to you by your health care provider. Make sure you discuss any questions you have with your health care provider. Document Released: 08/17/2015 Document Revised: 04/09/2016 Document Reviewed: 05/22/2015 Elsevier Interactive Patient Education  2017 [place] Prevention in the Home Falls can cause injuries. They can happen to people of all ages. There are many things you can do to make your home safe and to help prevent falls. What can I do on the outside of my home?  Regularly fix the edges of walkways and driveways and fix any cracks.  Remove anything that might make you trip as you walk through a door, such as a raised step or threshold.  Trim any bushes or trees on the path to your home.  Use bright outdoor lighting.  Clear any walking paths of anything that might make someone trip, such as rocks or tools.  Regularly check to see if handrails are loose or broken. Make sure that both sides of any steps have handrails.  Any raised decks and porches should have guardrails on the edges.  Have any leaves, snow, or ice cleared regularly.  Use sand or salt on walking paths during winter.  Clean up any spills in your garage right away. This includes oil or grease spills. What can I do in the bathroom?  Use night lights.  Install grab bars by the toilet and in the tub and shower. Do not use towel bars as grab bars.  Use non-skid mats or decals in the tub or shower.  If you need to sit down in the shower, use a plastic, non-slip stool.  Keep the floor dry. Clean up any water that spills on the floor as soon as it happens.  Remove soap buildup in the tub or shower regularly.  Attach bath mats securely with double-sided non-slip rug tape.  Do not have  throw rugs and other things on the floor that can make you trip. What can I do in the bedroom?  Use night lights.  Make sure that you have a light by your bed that is easy to reach.  Do not use any sheets or blankets that are too big for your bed. They should not hang down onto the floor.  Have a firm chair that has side arms. You can use this for support while you get dressed.  Do not have throw rugs and other things on the floor that can make you trip. What can I do in the kitchen?  Clean up any spills right away.  Avoid walking on wet floors.  Keep items that you use a lot in easy-to-reach places.  If you need to reach something above you, use a strong step stool that has a grab bar.  Keep electrical cords out of the way.  Do not use floor polish or wax that makes floors slippery. If you must use wax, use non-skid floor wax.  Do not have throw rugs and other things on the floor that can make you trip. What can I do with my stairs?  Do not leave any items  on the stairs.  Make sure that there are handrails on both sides of the stairs and use them. Fix handrails that are broken or loose. Make sure that handrails are as long as the stairways.  Check any carpeting to make sure that it is firmly attached to the stairs. Fix any carpet that is loose or worn.  Avoid having throw rugs at the top or bottom of the stairs. If you do have throw rugs, attach them to the floor with carpet tape.  Make sure that you have a light switch at the top of the stairs and the bottom of the stairs. If you do not have them, ask someone to add them for you. What else can I do to help prevent falls?  Wear shoes that:  Do not have high heels.  Have rubber bottoms.  Are comfortable and fit you well.  Are closed at the toe. Do not wear sandals.  If you use a stepladder:  Make sure that it is fully opened. Do not climb a closed stepladder.  Make sure that both sides of the stepladder are  locked into place.  Ask someone to hold it for you, if possible.  Clearly mark and make sure that you can see:  Any grab bars or handrails.  First and last steps.  Where the edge of each step is.  Use tools that help you move around (mobility aids) if they are needed. These include:  Canes.  Walkers.  Scooters.  Crutches.  Turn on the lights when you go into a dark area. Replace any light bulbs as soon as they burn out.  Set up your furniture so you have a clear path. Avoid moving your furniture around.  If any of your floors are uneven, fix them.  If there are any pets around you, be aware of where they are.  Review your medicines with your doctor. Some medicines can make you feel dizzy. This can increase your chance of falling. Ask your doctor what other things that you can do to help prevent falls. This information is not intended to replace advice given to you by your health care provider. Make sure you discuss any questions you have with your health care provider. Document Released: 05/17/2009 Document Revised: 12/27/2015 Document Reviewed: 08/25/2014 Elsevier Interactive Patient Education  2017 [employer].

## 2019-04-08 ENCOUNTER — Other Ambulatory Visit: Payer: Self-pay | Admitting: Internal Medicine

## 2019-04-08 LAB — FOOD ALLERGY PROFILE
Allergen Corn, IgE: <0.1 kU/L
Clam IgE: <0.1 kU/L
Codfish IgE: <0.1 kU/L
Egg White IgE: <0.1 kU/L
Milk IgE: <0.1 kU/L
Peanut IgE: <0.1 kU/L
Scallop IgE: <0.1 kU/L
Sesame Seed IgE: <0.1 kU/L
Shrimp IgE: <0.1 kU/L
Soybean IgE: <0.1 kU/L
Walnut IgE: <0.1 kU/L
Wheat IgE: <0.1 kU/L

## 2019-04-08 LAB — HEMOGLOBIN A1C
Est. average glucose Bld gHb Est-mCnc: 131 mg/dL
Hgb A1c MFr Bld: 6.2 % — ABNORMAL HIGH (ref 4.8–5.6)

## 2019-04-08 LAB — BMP8+EGFR
BUN/Creatinine Ratio: 16 (ref 12–28)
BUN: 15 mg/dL (ref 8–27)
CO2: 22 mmol/L (ref 20–29)
Calcium: 9.5 mg/dL (ref 8.7–10.3)
Chloride: 102 mmol/L (ref 96–106)
Creatinine, Ser: 0.96 mg/dL (ref 0.57–1.00)
GFR calc Af Amer: 64 mL/min/{1.73_m2} (ref >59)
GFR calc non Af Amer: 55 mL/min/{1.73_m2} — ABNORMAL LOW (ref >59)
Glucose: 98 mg/dL (ref 65–99)
Potassium: 4.5 mmol/L (ref 3.5–5.2)
Sodium: 140 mmol/L (ref 134–144)

## 2019-04-08 LAB — CORTISOL: Cortisol: 12.7 ug/dL

## 2019-04-08 LAB — ACTH: ACTH: 40 pg/mL (ref 7.2–63.3)

## 2019-04-08 LAB — BRAIN NATRIURETIC PEPTIDE: BNP: 115.3 pg/mL — ABNORMAL HIGH (ref 0.0–100.0)

## 2019-04-08 MED ORDER — HYDROCHLOROTHIAZIDE 12.5 MG PO CAPS: 12.5000 mg | ORAL_CAPSULE | Freq: Every day | ORAL | 1 refills | Status: DC

## 2019-04-08 MED FILL — HYDROCHLOROTHIAZIDE 12.5 MG: 12.5 | 30 days supply | Qty: 30 | Fill #0

## 2019-04-08 NOTE — Progress Notes (Addendum)
Subjective:     Patient ID: Anna Hoffman , female    DOB: 13-Nov-1934 , 84 y.o.   MRN: [id number]   Chief Complaint  Patient presents with  . Annual Exam  . Diabetes  . Hypertension    HPI  She is here today for a full physical examination. She is no longer followed by GYN. She reports having many concerns today. First, she reports feeling weak despite having a good appetite. Additionally, she has been feeling more anxious. She is not sure what is contributing to her symptoms.  Diabetes She presents for her follow-up diabetic visit. She has type 2 diabetes mellitus. Hypoglycemia symptoms include nervousness/anxiousness. There are no diabetic associated symptoms. Pertinent negatives for diabetes include no blurred vision and no chest pain. There are no hypoglycemic complications. Risk factors for coronary artery disease include diabetes mellitus, dyslipidemia, hypertension, post-menopausal and sedentary lifestyle. She never participates in exercise. An ACE inhibitor/angiotensin II receptor blocker is being taken. She does not see a podiatrist.Eye exam is current.  Hypertension This is a chronic problem. The current episode started more than 1 year ago. The problem has been gradually improving since onset. The problem is uncontrolled. Pertinent negatives include no blurred vision, chest pain, palpitations or shortness of breath.     Past Medical History:  Diagnosis Date  . Diabetes mellitus without complication ([place])   . Hypertension   . Hypokalemia   . Shingles   . Stroke [facility])      Family History  Problem Relation Age of Onset  . Alzheimer's disease Mother   . Prostate cancer Father   . Brain cancer Brother   . Brain cancer Brother   . Allergic rhinitis Neg Hx   . Asthma Neg Hx   . Eczema Neg Hx   . Urticaria Neg Hx      Current Outpatient Medications:  .  acetaminophen (TYLENOL) 325 MG tablet, Take 2 tablets (650 mg total) by mouth every 6 (six) hours as needed for mild  pain (or Fever >/= 101)., Disp: , Rfl:  .  acyclovir ointment (ZOVIRAX) 5 %, Apply thin layer to affected area, Disp: 15 g, Rfl: 2 .  amLODipine (NORVASC) 10 MG tablet, TAKE 1 TABLET BY MOUTH ONCE DAILY (Patient taking differently: Take 10 mg by mouth daily. ), Disp: 90 tablet, Rfl: 2 .  Ascorbic Acid (VITAMIN C) 1000 MG tablet, Take 1,000 mg by mouth daily., Disp: , Rfl:  .  aspirin EC 81 MG tablet, Take 81 mg by mouth daily., Disp: , Rfl:  .  azelastine (ASTELIN) 0.1 % nasal spray, Place 2 sprays into both nostrils 2 (two) times daily. Use in each nostril as directed, Disp: 30 mL, Rfl: 5 .  Carboxymethylcellul-Glycerin (LUBRICATING EYE DROPS OP), Apply to eye., Disp: , Rfl:  .  Eluxadoline (VIBERZI) 75 MG TABS, Take 75 mg by mouth daily., Disp: 30 tablet, Rfl: 0 .  Fexofenadine HCl (ALLEGRA PO), Take by mouth., Disp: , Rfl:  .  glucose blood (ONETOUCH VERIO) test strip, 1 each by Other route as needed for other. Use as instructed to check blood pressure 2 times per day dx: e11.65, Disp: 100 each, Rfl: 11 .  hydrochlorothiazide (MICROZIDE) 12.5 MG capsule, Take 1 capsule (12.5 mg total) by mouth daily., Disp: 30 capsule, Rfl: 1 .  irbesartan (AVAPRO) 150 MG tablet, TAKE 1 TABLET BY MOUTH EVERY DAY., Disp: 90 tablet, Rfl: 1 .  loperamide (IMODIUM) 2 MG capsule, Take 1 capsule (2 mg total) by  mouth 4 (four) times daily as needed for diarrhea or loose stools., Disp: 6 capsule, Rfl: 0 .  Multiple Vitamins-Minerals (STRESS TAB NF PO), Take 1 tablet by mouth daily., Disp: , Rfl:  .  omeprazole (PRILOSEC) 20 MG capsule, TAKE 1 CAPSULE BY MOUTH ONCE DAILY 30 MINUTES TO 1 HOUR BEFORE A MEAL (Patient taking differently: Take 20 mg by mouth daily. ), Disp: 90 capsule, Rfl: 2 .  ondansetron (ZOFRAN-ODT) 4 MG disintegrating tablet, Take 1 tablet (4 mg total) by mouth every 8 (eight) hours as needed for nausea or vomiting., Disp: 30 tablet, Rfl: 0 .  rosuvastatin (CRESTOR) 20 MG tablet, TAKE 1 TABLET BY MOUTH  ONCE DAILY (Patient taking differently: Take 20 mg by mouth daily. ), Disp: 90 tablet, Rfl: 2 .  trolamine salicylate (ASPERCREME) 10 % cream, Apply 1 application topically as needed for muscle pain., Disp: , Rfl:  .  vitamin B-12 (CYANOCOBALAMIN) 1000 MCG tablet, Take 1,000 mcg by mouth daily., Disp: , Rfl:    Allergies  Allergen Reactions  . Amoxicillin Diarrhea    Has patient had a PCN reaction causing immediate rash, facial/tongue/throat swelling, SOB or lightheadedness with hypotension: no Has patient had a PCN reaction causing severe rash involving mucus membranes or skin necrosis: no Has patient had a PCN reaction that required hospitalization: no pharmacist consult Has patient had a PCN reaction occurring within the last 10 years: yes If all of the above answers are "NO", then may proceed with Cephalosporin use.   . Ampicillin Rash    Has patient had a PCN reaction causing immediate rash, facial/tongue/throat swelling, SOB or lightheadedness with hypotension: No Has patient had a PCN reaction causing severe rash involving mucus membranes or skin necrosis: No Has patient had a PCN reaction that required hospitalization No Has patient had a PCN reaction occurring within the last 10 years: No If all of the above answers are "NO", then may proceed with Cephalosporin use.   . Sulfa Antibiotics Rash     Review of Systems  Constitutional: Negative.   HENT: Negative.   Eyes: Negative.  Negative for blurred vision.  Respiratory: Negative.  Negative for shortness of breath.   Cardiovascular: Negative.  Negative for chest pain and palpitations.  Endocrine: Negative.   Genitourinary: Negative.   Musculoskeletal: Positive for arthralgias.       She c/o left foot pain. She was previously seen at [facility]. Wants to go back to DR. Caffrey. Additionally, she feels that she is weak and needs to improve her strength. She is willing to do PT,but wants to do inside her home.   She does not leave home w/o assistance from family member. Her son or granddaughter take her to all MD visits.   Skin: Negative.   Allergic/Immunologic:       She c/o generalized itching. She is unable to state what triggers her symptoms. Her sx last all day.   Neurological: Negative.   Hematological: Negative.   Psychiatric/Behavioral: The patient is nervous/anxious.      Today's Vitals   04/06/19 1446  BP: (!) 142/78  Pulse: 100  Temp: 98.3 F (36.8 C)  TempSrc: Oral  SpO2: 95%  Weight: 139 lb 9.6 oz (63.3 kg)  Height: 5' 3.2" (1.605 m)   Body mass index is 24.57 kg/m.   Objective:  Physical Exam Vitals signs and nursing note reviewed.  Constitutional:      Appearance: Normal appearance.  HENT:     Head: Normocephalic  and atraumatic.     Right Ear: Tympanic membrane, ear canal and external ear normal.     Left Ear: Tympanic membrane, ear canal and external ear normal.     Nose: Nose normal.     Mouth/Throat:     Mouth: Mucous membranes are moist.     Pharynx: Oropharynx is clear.  Eyes:     Extraocular Movements: Extraocular movements intact.     Conjunctiva/sclera: Conjunctivae normal.     Pupils: Pupils are equal, round, and reactive to light.  Neck:     Musculoskeletal: Normal range of motion and neck supple.  Cardiovascular:     Rate and Rhythm: Normal rate and regular rhythm.     Pulses:          Dorsalis pedis pulses are 2+ on the right side and 2+ on the left side.       Posterior tibial pulses are 1+ on the right side and 1+ on the left side.     Heart sounds: Normal heart sounds.  Pulmonary:     Effort: Pulmonary effort is normal.     Breath sounds: Normal breath sounds.  Abdominal:     General: Abdomen is flat. Bowel sounds are normal.     Palpations: Abdomen is soft.  Genitourinary:    Comments: deferred Musculoskeletal: Normal range of motion.     Right foot: Normal range of motion. No deformity.     Left foot: Normal range of motion. No  deformity.  Feet:     Right foot:     Protective Sensation: 5 sites tested. 5 sites sensed.     Skin integrity: Skin integrity normal.     Toenail Condition: Right toenails are normal.     Left foot:     Protective Sensation: 5 sites tested. 5 sites sensed.     Skin integrity: Skin integrity normal.     Toenail Condition: Left toenails are normal.  Skin:    General: Skin is warm and dry.  Neurological:     General: No focal deficit present.     Mental Status: She is alert and oriented to person, place, and time.  Psychiatric:        Mood and Affect: Mood normal.        Behavior: Behavior normal.         Assessment And Plan:     1. Routine general medical examination at health care facility  A full exam was performed. GYN exam not done. Importance of monthly self breast exams was discussed with the patient.  PATIENT HAS BEEN ADVISED TO GET 30-45 MINUTES REGULAR EXERCISE NO LESS THAN FOUR TO FIVE DAYS PER WEEK - BOTH WEIGHTBEARING EXERCISES AND AEROBIC ARE RECOMMENDED.  HE/SHE WAS ADVISED TO FOLLOW A HEALTHY DIET WITH AT LEAST SIX FRUITS/VEGGIES PER DAY, DECREASE INTAKE OF RED MEAT, AND TO INCREASE FISH INTAKE TO TWO DAYS PER WEEK.  MEATS/FISH SHOULD NOT BE FRIED, BAKED OR BROILED IS PREFERABLE.  I SUGGEST WEARING SPF 50 SUNSCREEN ON EXPOSED PARTS AND ESPECIALLY WHEN IN THE DIRECT SUNLIGHT FOR AN EXTENDED PERIOD OF TIME.  PLEASE AVOID FAST FOOD RESTAURANTS AND INCREASE YOUR WATER INTAKE.   2. Type 2 diabetes mellitus with stage 3 chronic kidney disease, without long-term current use of insulin ([place])  Diabetic foot exam was performed.  I DISCUSSED WITH THE PATIENT AT LENGTH REGARDING THE GOALS OF GLYCEMIC CONTROL AND POSSIBLE LONG-TERM COMPLICATIONS.  I  ALSO STRESSED THE IMPORTANCE OF COMPLIANCE WITH HOME GLUCOSE MONITORING,  DIETARY RESTRICTIONS INCLUDING AVOIDANCE OF SUGARY DRINKS/PROCESSED FOODS,  ALONG WITH REGULAR EXERCISE.  I  ALSO STRESSED THE IMPORTANCE OF ANNUAL EYE EXAMS, SELF  FOOT CARE AND COMPLIANCE WITH OFFICE VISITS.  - Ambulatory referral to [place] - Referral to Chronic Care Management Services - Hemoglobin A1c - BMP8+EGFR  3. Hypertensive nephropathy  Chronic, fair control. She will continue with current meds. Pt advised I will consider adding diuretic to her regimen given her LE edema. However, I will check renal function first. EKG performed, no new changes noted.   - EKG 12-Lead - Ambulatory referral to [place] - Referral to Chronic Care Management Services - ECHOCARDIOGRAM COMPLETE; Future  4. Diastolic dysfunction  Most recent echocardiogram results reviewed. This was performed in 2017. I will refer her for repeat echo. I will also check BNP today.  - Brain natriuretic peptide - ECHOCARDIOGRAM COMPLETE; Future  5. Abnormality of gait due to impairment of balance  She agrees to home physical therapy.  She also agrees to [facility] referral for nursing/social work consult.   - Ambulatory referral to [place] - Referral to Chronic Care Management Services  6. Anxiety  Chronic. She is encouraged to take magnesium nightly. She will let me know if her sx persist.   7. Bilateral edema of lower extremity  She is encouraged to elevate her feet while seated and to decrease her salt intake.   8. Left foot pain  Chronic. I will refer her as requested to Dr. French Ana for further evaluation.   - Ambulatory referral to Orthopedic Surgery  9. Pruritus  Persistent and generalized. I will check basic food allergy profile today. She may benefit from hydroxyzine nightly.   - Food Allergy Profile  10. Weakness  I will check serum levels of hormones. I will consider dexamethasone suppression test.   - ACTH - Cortisol  11. Drug therapy   Maximino Greenland, MD    THE PATIENT IS ENCOURAGED TO PRACTICE SOCIAL DISTANCING DUE TO THE COVID-19 PANDEMIC.

## 2019-04-12 ENCOUNTER — Other Ambulatory Visit: Payer: Self-pay

## 2019-04-12 ENCOUNTER — Ambulatory Visit (INDEPENDENT_AMBULATORY_CARE_PROVIDER_SITE_OTHER): Payer: Medicare Other

## 2019-04-12 VITALS — BP 160/72 | HR 85 | Temp 98.6°F | Ht 63.2 in | Wt 139.0 lb

## 2019-04-12 DIAGNOSIS — Z23 Encounter for immunization: Secondary | ICD-10-CM

## 2019-04-12 NOTE — Progress Notes (Signed)
Results given. Stated that she will pick up medication tmrw. Pt seems confused yams said she came in for a flu shot and she gave her reslts and she didn't remember any of that and she keeps repeating the same thing over and over

## 2019-04-12 NOTE — Progress Notes (Signed)
Patient presents today for a flu shot.  

## 2019-04-14 ENCOUNTER — Telehealth: Payer: Self-pay | Admitting: Allergy

## 2019-04-14 ENCOUNTER — Other Ambulatory Visit: Payer: Self-pay | Admitting: *Deleted

## 2019-04-14 MED ORDER — AZITHROMYCIN 250 MG PO TABS: ORAL_TABLET | ORAL | 0 refills | Status: DC

## 2019-04-14 MED FILL — AZITHROMYCIN 250 MG TABLET: 250 | 5 days supply | Qty: 6 | Fill #0

## 2019-04-14 NOTE — Telephone Encounter (Signed)
Pt called and said that she is coughing and got yellow mucus, coughing so hard she throw up the mucus. [facility] on [address] [phone].

## 2019-04-14 NOTE — Telephone Encounter (Signed)
Since this has been an ongoing issue with the coughing and mucus production would recommend a course of Azithromycin 500mg  day 1, day 2-5 250mg .   This should help treat her ongoing bronchitis as well as help decrease any airway inflammation.

## 2019-04-14 NOTE — Telephone Encounter (Signed)
Prescription has been sent to pharmacy. Called patient and informed. Patient verbalized understanding.

## 2019-04-14 NOTE — Telephone Encounter (Signed)
Dr Padgett please advise 

## 2019-04-19 ENCOUNTER — Ambulatory Visit (INDEPENDENT_AMBULATORY_CARE_PROVIDER_SITE_OTHER): Payer: Medicare Other

## 2019-04-19 ENCOUNTER — Ambulatory Visit: Payer: Self-pay

## 2019-04-19 DIAGNOSIS — E1122 Type 2 diabetes mellitus with diabetic chronic kidney disease: Secondary | ICD-10-CM

## 2019-04-19 DIAGNOSIS — I129 Hypertensive chronic kidney disease with stage 1 through stage 4 chronic kidney disease, or unspecified chronic kidney disease: Secondary | ICD-10-CM | POA: Diagnosis not present

## 2019-04-19 DIAGNOSIS — N183 Chronic kidney disease, stage 3 unspecified: Secondary | ICD-10-CM

## 2019-04-19 DIAGNOSIS — I10 Essential (primary) hypertension: Secondary | ICD-10-CM

## 2019-04-19 DIAGNOSIS — I5189 Other ill-defined heart diseases: Secondary | ICD-10-CM

## 2019-04-19 DIAGNOSIS — M170 Bilateral primary osteoarthritis of knee: Secondary | ICD-10-CM | POA: Diagnosis not present

## 2019-04-19 NOTE — Chronic Care Management (AMB) (Signed)
Chronic Care Management   Initial Visit Note  04/19/2019 Name: Anna Hoffman MRN: [id number] DOB: 1934-11-26  Referred by: Glendale Chard, MD Reason for referral : Chronic Care Management (CCM RNCM Case Collaboration )   Anna Hoffman is a 84 y.o. year old female who is a primary care patient of Glendale Chard, MD. The care management team was consulted for assistance with chronic disease management and care coordination needs.   Review of patient status, including review of consultants reports, relevant laboratory and other test results, and collaboration with appropriate care team members and the patient's provider was performed as part of comprehensive patient evaluation and provision of chronic care management services.    I initiated and established the plan of care for Anna Hoffman during one on one collaboration with my clinical care management colleague Daneen Schick BSW who is also engaged with this patient to address social work needs.   Outpatient Encounter Medications as of 04/19/2019  Medication Sig  . acetaminophen (TYLENOL) 325 MG tablet Take 2 tablets (650 mg total) by mouth every 6 (six) hours as needed for mild pain (or Fever >/= 101).  Marland Kitchen acyclovir ointment (ZOVIRAX) 5 % Apply thin layer to affected area  . amLODipine (NORVASC) 10 MG tablet TAKE 1 TABLET BY MOUTH ONCE DAILY (Patient taking differently: Take 10 mg by mouth daily. )  . Ascorbic Acid (VITAMIN C) 1000 MG tablet Take 1,000 mg by mouth daily.  Marland Kitchen aspirin EC 81 MG tablet Take 81 mg by mouth daily.  Marland Kitchen azelastine (ASTELIN) 0.1 % nasal spray Place 2 sprays into both nostrils 2 (two) times daily. Use in each nostril as directed  . azithromycin (ZITHROMAX) 250 MG tablet Take 500mg  Day 1. Take 250mg  days 2-5. Then stop.  . Carboxymethylcellul-Glycerin (LUBRICATING EYE DROPS OP) Apply to eye.  . Eluxadoline (VIBERZI) 75 MG TABS Take 75 mg by mouth daily.  Marland Kitchen Fexofenadine HCl (ALLEGRA PO) Take by mouth.  Marland Kitchen glucose  blood (ONETOUCH VERIO) test strip 1 each by Other route as needed for other. Use as instructed to check blood pressure 2 times per day dx: e11.65  . hydrochlorothiazide (MICROZIDE) 12.5 MG capsule Take 1 capsule (12.5 mg total) by mouth daily.  . irbesartan (AVAPRO) 150 MG tablet TAKE 1 TABLET BY MOUTH EVERY DAY.  Marland Kitchen loperamide (IMODIUM) 2 MG capsule Take 1 capsule (2 mg total) by mouth 4 (four) times daily as needed for diarrhea or loose stools.  . Multiple Vitamins-Minerals (STRESS TAB NF PO) Take 1 tablet by mouth daily.  Marland Kitchen omeprazole (PRILOSEC) 20 MG capsule TAKE 1 CAPSULE BY MOUTH ONCE DAILY 30 MINUTES TO 1 HOUR BEFORE A MEAL (Patient taking differently: Take 20 mg by mouth daily. )  . ondansetron (ZOFRAN-ODT) 4 MG disintegrating tablet Take 1 tablet (4 mg total) by mouth every 8 (eight) hours as needed for nausea or vomiting.  . rosuvastatin (CRESTOR) 20 MG tablet TAKE 1 TABLET BY MOUTH ONCE DAILY (Patient taking differently: Take 20 mg by mouth daily. )  . trolamine salicylate (ASPERCREME) 10 % cream Apply 1 application topically as needed for muscle pain.  . vitamin B-12 (CYANOCOBALAMIN) 1000 MCG tablet Take 1,000 mcg by mouth daily.   No facility-administered encounter medications on file as of 04/19/2019.      Goals Addressed      Patient Stated   . Assist with Chronic Care Management and Care Coordination needs (pt-stated)       Current Barriers:  Marland Kitchen Knowledge Barriers related  to resources and support available to address needs related to Chronic Care Management and Care Coordination needs  Case Manager Clinical Goal(s):  Marland Kitchen Over the next 30 days, patient will work with the CCM team to address needs related to Chronic Disease Management and Care Coordination  Interventions:  . Collaborated with BSW and initiated plan of care to address needs related to chronic disease management and medication management  Patient Self Care Activities:  . Performs ADL's independently . Calls  provider office for new concerns or questions  Initial goal documentation         Telephone follow up appointment with care management team member scheduled for: 05/23/19  Barb Merino, RN, BSN, CCM Care Management Coordinator [place] [facility]  Direct Phone: [phone]

## 2019-04-19 NOTE — Patient Instructions (Signed)
Social Worker Visit Information  Goals we discussed today:  Goals Addressed            This Visit's Progress     Patient Stated   . "I am not taking that medicine for my leg swelling beucase it has Sulfa in it" (pt-stated)       Current Barriers:  . Patient reported concerns of medication ingredients . Unable to identify specific "new medication" prescribed  Clinical Social Work Clinical Goal(s):  Marland Kitchen Over the next 45 days the patient will work with the CCM team to become more knowledgeable of medications prescribed to address chronic conditions.  CCM SW Interventions: Completed 04/19/2019 . Patient interviewed and appropriate assessments performed . Determined the patient has recently had concerns with lower extremity swelling  . Informed by the patient that although she has yet to take new medications as prescribed she feels her condition has improved by lowering salt intake and increasing water intake. Marland Kitchen "You can tell Dr. Baird Cancer I am doing better" . Assessed for patient medication adherence concerns- unable to identify newly prescribed medications in questions as the patient can not recall the medication name but states "it has Sulfa in it" . Collaborated with primary care provider via in-basket message re: Patients reported medication adherence concerns surrounding not taking a newly prescribed medication due to obtaining Sulfa . Collaborated with RN Case Manager re: patients enrollment and reported medication adherence concerns . Discussed plans with patient for ongoing care management follow up and provided patient with direct contact information for care management team . Advised the patient to expect a call from the RN Case Manager in the coming weeks to assist with ongoing case management of chronic conditions  Patient Self Care Activities:  . Performs ADL's independently . Calls provider office for new concerns or questions  Initial goal documentation       Other   .  COMPLETED: Assist with enrollment into Chronic Care Management Program and conduct SDOH Screening       Current Barriers:  Marland Kitchen Knowledge Barriers related to resources and support available to address needs related to Chronic Care Management and challenges surrounding Social Determinants of Health  Clinical Social Work Clinical Goal(s):   Over the next 20 days, the patient will understand the role of the CCM team and work with SW to complete SDOH (Social Determinants of Health) screen.  Interventions:  Outbound call placed to the patient to introduce the Chronic Care Management program  Patient Education provided regarding referral placed by Dr. Baird Cancer and obtained verbal consent for enrollment  Patient screened for SDOH (Social Determinants of Health)  Identified challenges with: None  Collaboration with CCM RN Case Manager communicate patient's enrollment into the program  Patient Self Care Activities:   Patient currently unable to independently manage chronic conditions   Initial goal documentation        Materials provided: Verbal education about CCM program provided by phone  Ms. Matusik was given information about Chronic Care Management services today including:  1. CCM service includes personalized support from designated clinical staff supervised by her physician, including individualized plan of care and coordination with other care providers 2. 24/7 contact phone numbers for assistance for urgent and routine care needs. 3. Service will only be billed when office clinical staff spend 20 minutes or more in a month to coordinate care. 4. Only one practitioner may furnish and bill the service in a calendar month. 5. The patient may stop CCM services at any  time (effective at the end of the month) by phone call to the office staff. 6. The patient will be responsible for cost sharing (co-pay) of up to 20% of the service fee (after annual deductible is met).  Patient agreed to  services and verbal consent obtained.   The patient verbalized understanding of instructions provided today and declined a print copy of patient instruction materials.   Follow up plan: No planned SW follow up at this time. You will be contacted by our nurse Case Manager in the coming weeks. Please call me for future resource needs. It was a pleasure speaking with you today!   Daneen Schick, BSW, CDP Social Worker, Certified Dementia Practitioner [place] [facility] [place] [facility] [phone]

## 2019-04-19 NOTE — Chronic Care Management (AMB) (Signed)
  Chronic Care Management   Outreach Note  04/19/2019 Name: Anna Hoffman MRN: [id number] DOB: 1935-02-03  Referred by: Glendale Chard, MD Reason for referral : Care Coordination   An unsuccessful telephone outreach was attempted today. The patient was referred to the case management team by for assistance with chronic care management and care coordination.   Follow Up Plan: A HIPPA compliant phone message was left for the patient providing contact information and requesting a return call.  The care management team will reach out to the patient again over the next 10 days.   Daneen Schick, BSW, CDP Social Worker, Certified Dementia Practitioner [place] [facility] [place] [facility] [phone]

## 2019-04-19 NOTE — Chronic Care Management (AMB) (Signed)
Chronic Care Management     Social Work General Note  04/19/2019 Name: Anna Hoffman MRN: [id number] DOB: [birth year]  Anna Hoffman is a 84 y.o. year old female who is a primary care patient of Glendale Chard, MD. The CCM was consulted to assist the patient with Care Management to assist with chronic conditions.   Anna Hoffman was given information about Chronic Care Management services today including:  1. CCM service includes personalized support from designated clinical staff supervised by her physician, including individualized plan of care and coordination with other care providers 2. 24/7 contact phone numbers for assistance for urgent and routine care needs. 3. Service will only be billed when office clinical staff spend 20 minutes or more in a month to coordinate care. 4. Only one practitioner may furnish and bill the service in a calendar month. 5. The patient may stop CCM services at any time (effective at the end of the month) by phone call to the office staff. 6. The patient will be responsible for cost sharing (co-pay) of up to 20% of the service fee (after annual deductible is met).  Patient agreed to services and verbal consent obtained.   Review of patient status, including review of consultants reports, relevant laboratory and other test results, and collaboration with appropriate care team members and the patient's provider was performed as part of comprehensive patient evaluation and provision of chronic care management services.    SDOH (Social Determinants of Health) screening performed today. See Care Plan Entry related to challenges with: None   Outpatient Encounter Medications as of 04/19/2019  Medication Sig  . acetaminophen (TYLENOL) 325 MG tablet Take 2 tablets (650 mg total) by mouth every 6 (six) hours as needed for mild pain (or Fever >/= 101).  Marland Kitchen acyclovir ointment (ZOVIRAX) 5 % Apply thin layer to affected area  . amLODipine (NORVASC) 10 MG tablet TAKE 1  TABLET BY MOUTH ONCE DAILY (Patient taking differently: Take 10 mg by mouth daily. )  . Ascorbic Acid (VITAMIN C) 1000 MG tablet Take 1,000 mg by mouth daily.  Marland Kitchen aspirin EC 81 MG tablet Take 81 mg by mouth daily.  Marland Kitchen azelastine (ASTELIN) 0.1 % nasal spray Place 2 sprays into both nostrils 2 (two) times daily. Use in each nostril as directed  . azithromycin (ZITHROMAX) 250 MG tablet Take 535m Day 1. Take 2561mdays 2-5. Then stop.  . Carboxymethylcellul-Glycerin (LUBRICATING EYE DROPS OP) Apply to eye.  . Eluxadoline (VIBERZI) 75 MG TABS Take 75 mg by mouth daily.  . Marland Kitchenexofenadine HCl (ALLEGRA PO) Take by mouth.  . Marland Kitchenlucose blood (ONETOUCH VERIO) test strip 1 each by Other route as needed for other. Use as instructed to check blood pressure 2 times per day dx: e11.65  . hydrochlorothiazide (MICROZIDE) 12.5 MG capsule Take 1 capsule (12.5 mg total) by mouth daily.  . irbesartan (AVAPRO) 150 MG tablet TAKE 1 TABLET BY MOUTH EVERY DAY.  . Marland Kitchenoperamide (IMODIUM) 2 MG capsule Take 1 capsule (2 mg total) by mouth 4 (four) times daily as needed for diarrhea or loose stools.  . Multiple Vitamins-Minerals (STRESS TAB NF PO) Take 1 tablet by mouth daily.  . Marland Kitchenmeprazole (PRILOSEC) 20 MG capsule TAKE 1 CAPSULE BY MOUTH ONCE DAILY 30 MINUTES TO 1 HOUR BEFORE A MEAL (Patient taking differently: Take 20 mg by mouth daily. )  . ondansetron (ZOFRAN-ODT) 4 MG disintegrating tablet Take 1 tablet (4 mg total) by mouth every 8 (eight) hours as needed for nausea or  vomiting.  . rosuvastatin (CRESTOR) 20 MG tablet TAKE 1 TABLET BY MOUTH ONCE DAILY (Patient taking differently: Take 20 mg by mouth daily. )  . trolamine salicylate (ASPERCREME) 10 % cream Apply 1 application topically as needed for muscle pain.  . vitamin B-12 (CYANOCOBALAMIN) 1000 MCG tablet Take 1,000 mcg by mouth daily.   No facility-administered encounter medications on file as of 04/19/2019.     Goals Addressed            This Visit's Progress      Patient Stated   . "I am not taking that medicine for my leg swelling beucase it has Sulfa in it" (pt-stated)       Current Barriers:  . Patient reported concerns of medication ingredients . Unable to identify specific "new medication" prescribed  Clinical Social Work Clinical Goal(s):  Marland Kitchen Over the next 45 days the patient will work with the CCM team to become more knowledgeable of medications prescribed to address chronic conditions.  CCM SW Interventions: Completed 04/19/2019 . Patient interviewed and appropriate assessments performed . Determined the patient has recently had concerns with lower extremity swelling  . Informed by the patient that although she has yet to take new medications as prescribed she feels her condition has improved by lowering salt intake and increasing water intake. Marland Kitchen "You can tell Dr. Baird Cancer I am doing better" . Assessed for patient medication adherence concerns- unable to identify newly prescribed medications in questions as the patient can not recall the medication name but states "it has Sulfa in it" . Collaborated with primary care provider via in-basket message re: Patients reported medication adherence concerns surrounding not taking a newly prescribed medication due to obtaining Sulfa . Collaborated with RN Case Manager re: patients enrollment and reported medication adherence concerns . Discussed plans with patient for ongoing care management follow up and provided patient with direct contact information for care management team . Advised the patient to expect a call from the RN Case Manager in the coming weeks to assist with ongoing case management of chronic conditions  Patient Self Care Activities:  . Performs ADL's independently . Calls provider office for new concerns or questions  Initial goal documentation       Other   . COMPLETED: Assist with enrollment into Chronic Care Management Program and conduct SDOH Screening       Current Barriers:   Marland Kitchen Knowledge Barriers related to resources and support available to address needs related to Chronic Care Management and challenges surrounding Social Determinants of Health  Clinical Social Work Clinical Goal(s):   Over the next 20 days, the patient will understand the role of the CCM team and work with SW to complete SDOH (Social Determinants of Health) screen.  Interventions:  Outbound call placed to the patient to introduce the Chronic Care Management program  Patient Education provided regarding referral placed by Dr. Baird Cancer and obtained verbal consent for enrollment  Patient screened for SDOH (Social Determinants of Health)  Identified challenges with: None  Collaboration with CCM RN Case Manager communicate patient's enrollment into the program  Patient Self Care Activities:   Patient currently unable to independently manage chronic conditions   Initial goal documentation        Follow Up Plan: No planned follow up by SW at this time. The patient will be engaged by RN Case Manager in the coming weeks. SW is available to assist with patient care coordination needs as needed.  Daneen Schick, BSW, CDP Social Worker, Certified Dementia Practitioner [place] [facility] [place] [facility] [phone]  Total time spent performing care coordination and/or care management activities with the patient by phone or face to face = 16 minutes.

## 2019-04-21 DIAGNOSIS — K311 Adult hypertrophic pyloric stenosis: Secondary | ICD-10-CM

## 2019-04-21 DIAGNOSIS — K440 Diaphragmatic hernia with obstruction, without gangrene: Secondary | ICD-10-CM

## 2019-04-21 DIAGNOSIS — Z8673 Personal history of transient ischemic attack (TIA), and cerebral infarction without residual deficits: Secondary | ICD-10-CM

## 2019-04-21 DIAGNOSIS — N183 Chronic kidney disease, stage 3 (moderate): Secondary | ICD-10-CM | POA: Diagnosis not present

## 2019-04-21 DIAGNOSIS — M79672 Pain in left foot: Secondary | ICD-10-CM

## 2019-04-21 DIAGNOSIS — B029 Zoster without complications: Secondary | ICD-10-CM

## 2019-04-21 DIAGNOSIS — E782 Mixed hyperlipidemia: Secondary | ICD-10-CM

## 2019-04-21 DIAGNOSIS — I1310 Hypertensive heart and chronic kidney disease without heart failure, with stage 1 through stage 4 chronic kidney disease, or unspecified chronic kidney disease: Secondary | ICD-10-CM | POA: Diagnosis not present

## 2019-04-21 DIAGNOSIS — Z9181 History of falling: Secondary | ICD-10-CM | POA: Diagnosis not present

## 2019-04-21 DIAGNOSIS — N2889 Other specified disorders of kidney and ureter: Secondary | ICD-10-CM

## 2019-04-21 DIAGNOSIS — L299 Pruritus, unspecified: Secondary | ICD-10-CM

## 2019-04-21 DIAGNOSIS — E1122 Type 2 diabetes mellitus with diabetic chronic kidney disease: Secondary | ICD-10-CM | POA: Diagnosis not present

## 2019-04-21 DIAGNOSIS — F419 Anxiety disorder, unspecified: Secondary | ICD-10-CM | POA: Diagnosis not present

## 2019-04-21 DIAGNOSIS — Z86018 Personal history of other benign neoplasm: Secondary | ICD-10-CM | POA: Diagnosis not present

## 2019-04-22 ENCOUNTER — Ambulatory Visit (HOSPITAL_COMMUNITY)
Admission: RE | Admit: 2019-04-22 | Discharge: 2019-04-22 | Disposition: A | Discharge status: Home / Self Care | Payer: Medicare Other | Source: Ambulatory Visit | Attending: Internal Medicine | Admitting: Internal Medicine

## 2019-04-22 ENCOUNTER — Other Ambulatory Visit: Payer: Self-pay

## 2019-04-22 DIAGNOSIS — N189 Chronic kidney disease, unspecified: Secondary | ICD-10-CM | POA: Insufficient documentation

## 2019-04-22 DIAGNOSIS — I129 Hypertensive chronic kidney disease with stage 1 through stage 4 chronic kidney disease, or unspecified chronic kidney disease: Secondary | ICD-10-CM | POA: Diagnosis not present

## 2019-04-22 DIAGNOSIS — I1310 Hypertensive heart and chronic kidney disease without heart failure, with stage 1 through stage 4 chronic kidney disease, or unspecified chronic kidney disease: Secondary | ICD-10-CM | POA: Diagnosis not present

## 2019-04-22 DIAGNOSIS — I5189 Other ill-defined heart diseases: Secondary | ICD-10-CM

## 2019-04-22 NOTE — Progress Notes (Signed)
Echocardiogram 2D Echocardiogram has been performed.  Oneal Deputy Zalea Pete 04/22/2019, 10:41 AM

## 2019-04-25 DIAGNOSIS — M76822 Posterior tibial tendinitis, left leg: Secondary | ICD-10-CM | POA: Diagnosis not present

## 2019-04-26 DIAGNOSIS — M170 Bilateral primary osteoarthritis of knee: Secondary | ICD-10-CM | POA: Diagnosis not present

## 2019-05-03 DIAGNOSIS — M79672 Pain in left foot: Secondary | ICD-10-CM | POA: Diagnosis not present

## 2019-05-03 DIAGNOSIS — L299 Pruritus, unspecified: Secondary | ICD-10-CM | POA: Diagnosis not present

## 2019-05-03 DIAGNOSIS — K311 Adult hypertrophic pyloric stenosis: Secondary | ICD-10-CM | POA: Diagnosis not present

## 2019-05-03 DIAGNOSIS — I1310 Hypertensive heart and chronic kidney disease without heart failure, with stage 1 through stage 4 chronic kidney disease, or unspecified chronic kidney disease: Secondary | ICD-10-CM | POA: Diagnosis not present

## 2019-05-03 DIAGNOSIS — E782 Mixed hyperlipidemia: Secondary | ICD-10-CM | POA: Diagnosis not present

## 2019-05-03 DIAGNOSIS — M170 Bilateral primary osteoarthritis of knee: Secondary | ICD-10-CM | POA: Diagnosis not present

## 2019-05-03 DIAGNOSIS — E1122 Type 2 diabetes mellitus with diabetic chronic kidney disease: Secondary | ICD-10-CM | POA: Diagnosis not present

## 2019-05-03 DIAGNOSIS — B029 Zoster without complications: Secondary | ICD-10-CM | POA: Diagnosis not present

## 2019-05-03 DIAGNOSIS — Z9181 History of falling: Secondary | ICD-10-CM | POA: Diagnosis not present

## 2019-05-03 DIAGNOSIS — N183 Chronic kidney disease, stage 3 (moderate): Secondary | ICD-10-CM | POA: Diagnosis not present

## 2019-05-03 DIAGNOSIS — N2889 Other specified disorders of kidney and ureter: Secondary | ICD-10-CM | POA: Diagnosis not present

## 2019-05-03 DIAGNOSIS — Z86018 Personal history of other benign neoplasm: Secondary | ICD-10-CM | POA: Diagnosis not present

## 2019-05-03 DIAGNOSIS — Z8673 Personal history of transient ischemic attack (TIA), and cerebral infarction without residual deficits: Secondary | ICD-10-CM | POA: Diagnosis not present

## 2019-05-03 DIAGNOSIS — K440 Diaphragmatic hernia with obstruction, without gangrene: Secondary | ICD-10-CM | POA: Diagnosis not present

## 2019-05-04 MED FILL — PAZEO 0.7% EYE DROPS: 0.7 | 25 days supply | Qty: 3 | Fill #1

## 2019-05-04 MED FILL — OMEPRAZOLE 20 MG CAPSULE DR: 20 | 90 days supply | Qty: 90 | Fill #1

## 2019-05-04 MED FILL — ONETOUCH VERIO TEST STRIP: 50 days supply | Qty: 100 | Fill #1

## 2019-05-04 MED FILL — AMLODIPINE BESYLATE 10 MG T: 10 | 90 days supply | Qty: 90 | Fill #1

## 2019-05-04 MED FILL — ROSUVASTATIN CALCIUM 20 MG: 20 | 90 days supply | Qty: 90 | Fill #1

## 2019-05-09 ENCOUNTER — Other Ambulatory Visit: Payer: Self-pay | Admitting: Internal Medicine

## 2019-05-09 DIAGNOSIS — Z1231 Encounter for screening mammogram for malignant neoplasm of breast: Secondary | ICD-10-CM

## 2019-05-11 MED FILL — ACYCLOVIR 5% OINTMENT: 5 | 15 days supply | Qty: 15 | Fill #2

## 2019-05-16 DIAGNOSIS — M170 Bilateral primary osteoarthritis of knee: Secondary | ICD-10-CM | POA: Diagnosis not present

## 2019-05-23 ENCOUNTER — Telehealth: Payer: Medicare Other

## 2019-05-23 ENCOUNTER — Ambulatory Visit: Payer: Self-pay

## 2019-05-23 DIAGNOSIS — I10 Essential (primary) hypertension: Secondary | ICD-10-CM

## 2019-05-23 DIAGNOSIS — N183 Chronic kidney disease, stage 3 unspecified: Secondary | ICD-10-CM

## 2019-05-23 DIAGNOSIS — I129 Hypertensive chronic kidney disease with stage 1 through stage 4 chronic kidney disease, or unspecified chronic kidney disease: Secondary | ICD-10-CM

## 2019-05-23 DIAGNOSIS — I5189 Other ill-defined heart diseases: Secondary | ICD-10-CM

## 2019-05-23 NOTE — Chronic Care Management (AMB) (Signed)
  Chronic Care Management   Outreach Note  05/23/2019 Name: Anna Hoffman MRN: [id number] DOB: 1935-02-21  Referred by: Glendale Chard, MD Reason for referral : Chronic Care Management (INITIAL CCM RNCM Telephone Outreach )   An unsuccessful telephone outreach was attempted today. The patient was referred to the case management team by Glendale Chard MD for assistance with care management and care coordination.   Follow Up Plan: Telephone follow up appointment with care management team member scheduled for:06/13/19  Barb Merino, RN, BSN, CCM Care Management Coordinator [place] [facility]  Direct Phone: [phone]

## 2019-05-25 DIAGNOSIS — Z961 Presence of intraocular lens: Secondary | ICD-10-CM | POA: Diagnosis not present

## 2019-05-25 DIAGNOSIS — H532 Diplopia: Secondary | ICD-10-CM | POA: Diagnosis not present

## 2019-05-26 ENCOUNTER — Other Ambulatory Visit: Payer: Self-pay

## 2019-05-26 ENCOUNTER — Encounter: Payer: Self-pay | Admitting: Gastroenterology

## 2019-05-26 ENCOUNTER — Encounter: Payer: Self-pay | Admitting: Internal Medicine

## 2019-05-26 ENCOUNTER — Ambulatory Visit (INDEPENDENT_AMBULATORY_CARE_PROVIDER_SITE_OTHER): Payer: Medicare HMO | Admitting: Internal Medicine

## 2019-05-26 VITALS — BP 124/72 | HR 84 | Temp 98.0°F | Ht 63.6 in | Wt 138.2 lb

## 2019-05-26 DIAGNOSIS — K591 Functional diarrhea: Secondary | ICD-10-CM | POA: Diagnosis not present

## 2019-05-26 NOTE — Progress Notes (Signed)
Subjective:     Patient ID: Anna Hoffman , female    DOB: August 29, 1934 , 84 y.o.   MRN: [id number]   Chief Complaint  Patient presents with  . Diarrhea    HPI Feeling weak and tired since after having Shingles. Has hx of intermittent diarrhea provoked by certain foods x 2 yeas. Has not seen GI during this time.  Onset of diarrhea Sat after eating grilled cheese, and went  X 3 then,  and then again yesterday. Anna Hoffman has been feeling weak since Shingles. Sat pm Anna Hoffman took 3 doses, and 3 more on Sunday. Yesterday had plain baked potatoe and had diarrhea after a few minutes.  Anna Hoffman gets diarrhea off and on since Anna Hoffman had peptic ulcer surgery.  Denies fever, chills or sweats or blood in stool. No BM yesterday. # 2 yesterday. Denies abdominal cramps.  Imodium helps and took one dose yesterday  Past Medical History:  Diagnosis Date  . Diabetes mellitus without complication ([place])   . Hypertension   . Hypokalemia   . Shingles   . Stroke [facility])      Family History  Problem Relation Age of Onset  . Alzheimer's disease Mother   . Prostate cancer Father   . Brain cancer Brother   . Brain cancer Brother   . Allergic rhinitis Neg Hx   . Asthma Neg Hx   . Eczema Neg Hx   . Urticaria Neg Hx      Current Outpatient Medications:  .  acetaminophen (TYLENOL) 325 MG tablet, Take 2 tablets (650 mg total) by mouth every 6 (six) hours as needed for mild pain (or Fever >/= 101)., Disp: , Rfl:  .  amLODipine (NORVASC) 10 MG tablet, TAKE 1 TABLET BY MOUTH ONCE DAILY (Patient taking differently: Take 10 mg by mouth daily. ), Disp: 90 tablet, Rfl: 2 .  Ascorbic Acid (VITAMIN C) 1000 MG tablet, Take 1,000 mg by mouth daily., Disp: , Rfl:  .  aspirin EC 81 MG tablet, Take 81 mg by mouth daily., Disp: , Rfl:  .  azelastine (ASTELIN) 0.1 % nasal spray, Place 2 sprays into both nostrils 2 (two) times daily. Use in each nostril as directed, Disp: 30 mL, Rfl: 5 .  Carboxymethylcellul-Glycerin (LUBRICATING EYE DROPS  OP), Apply to eye., Disp: , Rfl:  .  Fexofenadine HCl (ALLEGRA PO), Take by mouth., Disp: , Rfl:  .  glucose blood (ONETOUCH VERIO) test strip, 1 each by Other route as needed for other. Use as instructed to check blood pressure 2 times per day dx: e11.65, Disp: 100 each, Rfl: 11 .  hydrochlorothiazide (MICROZIDE) 12.5 MG capsule, Take 1 capsule (12.5 mg total) by mouth daily., Disp: 30 capsule, Rfl: 1 .  loperamide (IMODIUM) 2 MG capsule, Take 1 capsule (2 mg total) by mouth 4 (four) times daily as needed for diarrhea or loose stools., Disp: 6 capsule, Rfl: 0 .  Multiple Vitamins-Minerals (STRESS TAB NF PO), Take 1 tablet by mouth daily., Disp: , Rfl:  .  omeprazole (PRILOSEC) 20 MG capsule, TAKE 1 CAPSULE BY MOUTH ONCE DAILY 30 MINUTES TO 1 HOUR BEFORE A MEAL (Patient taking differently: Take 20 mg by mouth daily. ), Disp: 90 capsule, Rfl: 2 .  rosuvastatin (CRESTOR) 20 MG tablet, TAKE 1 TABLET BY MOUTH ONCE DAILY (Patient taking differently: Take 20 mg by mouth daily. ), Disp: 90 tablet, Rfl: 2 .  trolamine salicylate (ASPERCREME) 10 % cream, Apply 1 application topically as needed for muscle pain., Disp: ,  Rfl:  .  vitamin B-12 (CYANOCOBALAMIN) 1000 MCG tablet, Take 1,000 mcg by mouth daily., Disp: , Rfl:  .  Eluxadoline (VIBERZI) 75 MG TABS, Take 75 mg by mouth daily. (Patient not taking: Reported on 05/26/2019), Disp: 30 tablet, Rfl: 0 .  irbesartan (AVAPRO) 150 MG tablet, TAKE 1 TABLET BY MOUTH EVERY DAY., Disp: 90 tablet, Rfl: 1 .  ondansetron (ZOFRAN-ODT) 4 MG disintegrating tablet, Take 1 tablet (4 mg total) by mouth every 8 (eight) hours as needed for nausea or vomiting. (Patient not taking: Reported on 05/26/2019), Disp: 30 tablet, Rfl: 0   Allergies  Allergen Reactions  . Amoxicillin Diarrhea    Has patient had a PCN reaction causing immediate rash, facial/tongue/throat swelling, SOB or lightheadedness with hypotension: no Has patient had a PCN reaction causing severe rash involving  mucus membranes or skin necrosis: no Has patient had a PCN reaction that required hospitalization: no pharmacist consult Has patient had a PCN reaction occurring within the last 10 years: yes If all of the above answers are "NO", then may proceed with Cephalosporin use.   . Ampicillin Rash    Has patient had a PCN reaction causing immediate rash, facial/tongue/throat swelling, SOB or lightheadedness with hypotension: No Has patient had a PCN reaction causing severe rash involving mucus membranes or skin necrosis: No Has patient had a PCN reaction that required hospitalization No Has patient had a PCN reaction occurring within the last 10 years: No If all of the above answers are "NO", then may proceed with Cephalosporin use.   . Sulfa Antibiotics Rash     Review of Systems  Constitutional: Positive for fatigue. Negative for appetite change, chills, diaphoresis and fever.       Denies inability to taste  Respiratory: Negative for cough and chest tightness.   Cardiovascular: Negative for chest pain.  Gastrointestinal: Positive for diarrhea. Negative for abdominal distention, abdominal pain, blood in stool, constipation, nausea and vomiting.  Musculoskeletal: Negative for myalgias.  Skin: Negative for rash.    + fatigue, weakness, loose stools most of the time 1-2 per day with no abdominal pain. Denies blood in stool. Denies CP, SOB, fever, chills or sweats.  Today's Vitals   05/26/19 1059  BP: 124/72  Pulse: 84  Temp: 98 F (36.7 C)  TempSrc: Oral  Weight: 138 lb 3.2 oz (62.7 kg)  Height: 5' 3.6" (1.615 m)   Body mass index is 24.02 kg/m.   Objective:  Physical Exam Vitals signs and nursing note reviewed.  Constitutional:      General: Anna Hoffman is not in acute distress.    Appearance: Normal appearance. Anna Hoffman is not toxic-appearing.  HENT:     Head: Atraumatic.     Right Ear: External ear normal.     Left Ear: External ear normal.     Nose: Nose normal.     Mouth/Throat:      Mouth: Mucous membranes are moist.  Eyes:     General: Scleral icterus present.     Conjunctiva/sclera: Conjunctivae normal.  Neck:     Musculoskeletal: Neck supple.  Cardiovascular:     Rate and Rhythm: Normal rate and regular rhythm.  Pulmonary:     Effort: Pulmonary effort is normal.     Breath sounds: Normal breath sounds.  Abdominal:     General: Abdomen is flat. There is no distension.     Palpations: Abdomen is soft. There is no mass.     Tenderness: There is no abdominal tenderness. There is no  guarding or rebound.     Hernia: No hernia is present.  Musculoskeletal: Normal range of motion.  Lymphadenopathy:     Cervical: No cervical adenopathy.  Skin:    General: Skin is warm and dry.     Capillary Refill: Capillary refill takes less than 2 seconds.     Findings: No rash.  Neurological:     Mental Status: Anna Hoffman is alert and oriented to person, place, and time.     Motor: No weakness.     Gait: Gait normal.  Psychiatric:        Mood and Affect: Mood normal.        Behavior: Behavior normal.        Thought Content: Thought content normal.        Judgment: Judgment normal.    Assessment And Plan:    1. Functional diarrhea- provoked with food.  Since Anna Hoffman has had this for 2 years and is not resolving Anna Hoffman is willing to see GI - CMP14 + Anion Gap - CBC no Diff - Ambulatory referral to Gastroenterology    Sumayyah Custodio [facility], PA-C    THE PATIENT IS ENCOURAGED TO PRACTICE SOCIAL DISTANCING DUE TO THE COVID-19 PANDEMIC.

## 2019-05-27 LAB — CMP14 + ANION GAP
ALT: 15 IU/L (ref 0–32)
AST: 20 IU/L (ref 0–40)
Albumin/Globulin Ratio: 1.7 (ref 1.2–2.2)
Albumin: 4.5 g/dL (ref 3.6–4.6)
Alkaline Phosphatase: 96 IU/L (ref 39–117)
Anion Gap: 16 mmol/L (ref 10.0–18.0)
BUN/Creatinine Ratio: 11 — ABNORMAL LOW (ref 12–28)
BUN: 13 mg/dL (ref 8–27)
Bilirubin Total: 0.3 mg/dL (ref 0.0–1.2)
CO2: 21 mmol/L (ref 20–29)
Calcium: 8.5 mg/dL — ABNORMAL LOW (ref 8.7–10.3)
Chloride: 105 mmol/L (ref 96–106)
Creatinine, Ser: 1.18 mg/dL — ABNORMAL HIGH (ref 0.57–1.00)
GFR calc Af Amer: 50 mL/min/{1.73_m2} — ABNORMAL LOW (ref >59)
GFR calc non Af Amer: 43 mL/min/{1.73_m2} — ABNORMAL LOW (ref >59)
Globulin, Total: 2.6 g/dL (ref 1.5–4.5)
Glucose: 82 mg/dL (ref 65–99)
Potassium: 4 mmol/L (ref 3.5–5.2)
Sodium: 142 mmol/L (ref 134–144)
Total Protein: 7.1 g/dL (ref 6.0–8.5)

## 2019-06-06 ENCOUNTER — Ambulatory Visit (INDEPENDENT_AMBULATORY_CARE_PROVIDER_SITE_OTHER): Payer: Medicare HMO | Admitting: Gastroenterology

## 2019-06-06 ENCOUNTER — Encounter: Payer: Self-pay | Admitting: Gastroenterology

## 2019-06-06 VITALS — BP 110/70 | HR 96 | Temp 98.2°F | Wt 139.0 lb

## 2019-06-06 DIAGNOSIS — R1084 Generalized abdominal pain: Secondary | ICD-10-CM

## 2019-06-06 DIAGNOSIS — R195 Other fecal abnormalities: Secondary | ICD-10-CM | POA: Diagnosis not present

## 2019-06-06 DIAGNOSIS — R197 Diarrhea, unspecified: Secondary | ICD-10-CM | POA: Diagnosis not present

## 2019-06-06 MED ORDER — DICYCLOMINE HCL 10 MG PO CAPS: 10.0000 mg | ORAL_CAPSULE | Freq: Two times a day (BID) | ORAL | 1 refills | Status: DC

## 2019-06-06 MED FILL — DICYCLOMINE 10 MG CAPSULE: 10 | 30 days supply | Qty: 60 | Fill #0

## 2019-06-06 NOTE — Progress Notes (Addendum)
06/06/2019 Anna Hoffman [id number] 04-25-1935   HISTORY OF PRESENT ILLNESS: This is an 84 year old female who is new to our office.  She has been referred here by her PCP, Dr. Baird Cancer, for evaluation regarding complaints of diarrhea/loose stools and abdominal pain.  She tells me that back in June she had an episode of shingles.  She says she was prescribed a medication for treatment of the shingles and she only took 1 dose and then became very sick with severe abdominal pain and diarrhea.  This initially lasted for 2 weeks.  Now she is continued to have intermittent issues with loose stools and diarrhea along with abdominal pain since then.  Recently it has been more so if she eats any type of sweets.  She says overall she just feels weak like she cannot get her strength back.  She denies any diarrhea this week.  She tells me that she took some type of probiotic and it seemed to make her constipated.  She keeps talking about her nerves and worrying about different family members, etc.  CT scan of the abdomen and pelvis with contrast in July showed the following:  IMPRESSION: 1. No acute findings within the abdomen or pelvis. 2. Stable benign left renal angiomyolipoma. 3. Colonic diverticulosis, without radiographic evidence of diverticulitis.  Aortic Atherosclerosis (ICD10-I70.0).  Extensive lab studies have been unremarkable/unrevealing as to exact cause of her symptoms.  She tells me that she has had a colonoscopy with Dr. Earlean Shawl she thinks just a few years ago, but does not recall exact findings, although nothing major from her report.  It appears that she also has a history with [facility], specifically Dr. Michail Sermon, it looks like may be just during a hospitalization in 2016.  It looks like she had an EGD at that time and was found to have a massive hiatal hernia causing mechanical obstruction.  She then had this repaired by Dr. Excell Seltzer.   Past Medical History:  Diagnosis Date  .  Arthritis   . Diabetes mellitus without complication ([place])   . Hypertension   . Hypokalemia   . Pneumonia   . Shingles   . Stroke [facility])    Past Surgical History:  Procedure Laterality Date  . ABDOMINAL HYSTERECTOMY    . BREAST EXCISIONAL BIOPSY Left   . ESOPHAGOGASTRODUODENOSCOPY N/A 09/01/2014   Procedure: ESOPHAGOGASTRODUODENOSCOPY (EGD);  Surgeon: Lear Ng, MD;  Location: [facility];  Service: Endoscopy;  Laterality: N/A;  . HIATAL HERNIA REPAIR N/A 09/04/2014   Procedure: LAPAROSCOPIC REPAIR OF HIATAL HERNIA;  Surgeon: Excell Seltzer, MD;  Location: [facility];  Service: General;  Laterality: N/A;  With MESH  . IR GENERIC HISTORICAL  04/10/2016   IR US GUIDE VASC ACCESS RIGHT 04/10/2016 Corrie Mckusick, DO [facility]  . IR GENERIC HISTORICAL  04/10/2016   IR ANGIOGRAM SELECTIVE EACH ADDITIONAL VESSEL 04/10/2016 Corrie Mckusick, DO [facility]  . IR GENERIC HISTORICAL  04/10/2016   IR ANGIOGRAM SELECTIVE EACH ADDITIONAL VESSEL 04/10/2016 Corrie Mckusick, DO [facility]  . IR GENERIC HISTORICAL  04/10/2016   IR EMBO TUMOR ORGAN ISCHEMIA INFARCT INC GUIDE ROADMAPPING 04/10/2016 Corrie Mckusick, DO [facility]  . IR GENERIC HISTORICAL  04/10/2016   IR ANGIOGRAM SELECTIVE EACH ADDITIONAL VESSEL 04/10/2016 Corrie Mckusick, DO [facility]  . IR GENERIC HISTORICAL  04/10/2016   IR ANGIOGRAM SELECTIVE EACH ADDITIONAL VESSEL 04/10/2016 Corrie Mckusick, DO [facility]  . IR GENERIC HISTORICAL  04/10/2016   IR RENAL SELECTIVE  UNI  INC S&I MOD SED 04/10/2016 Corrie Mckusick, DO [facility]  . IR GENERIC HISTORICAL  03/06/2016   IR RADIOLOGIST EVAL & MGMT 03/06/2016 Corrie Mckusick, DO [facility]  . IR GENERIC HISTORICAL  03/26/2016   IR RADIOLOGIST EVAL & MGMT 03/26/2016 Corrie Mckusick, DO [facility]  . IR GENERIC HISTORICAL  04/29/2016   IR RADIOLOGIST EVAL & MGMT 04/29/2016 [facility]  . IR RADIOLOGIST EVAL & MGMT  11/12/2016  . IR RADIOLOGIST EVAL & MGMT  12/16/2017  . KNEE SURGERY    . TONSILLECTOMY       reports that she has never smoked. She has never used smokeless tobacco. She reports that she does not drink alcohol or use drugs. family history includes Alzheimer's disease in her mother; Brain cancer in her brother and brother; Pancreatic cancer in her sister; Prostate cancer in her father. Allergies  Allergen Reactions  . Amoxicillin Diarrhea    Has patient had a PCN reaction causing immediate rash, facial/tongue/throat swelling, SOB or lightheadedness with hypotension: no Has patient had a PCN reaction causing severe rash involving mucus membranes or skin necrosis: no Has patient had a PCN reaction that required hospitalization: no pharmacist consult Has patient had a PCN reaction occurring within the last 10 years: yes If all of the above answers are "NO", then may proceed with Cephalosporin use.   . Ampicillin Rash    Has patient had a PCN reaction causing immediate rash, facial/tongue/throat swelling, SOB or lightheadedness with hypotension: No Has patient had a PCN reaction causing severe rash involving mucus membranes or skin necrosis: No Has patient had a PCN reaction that required hospitalization No Has patient had a PCN reaction occurring within the last 10 years: No If all of the above answers are "NO", then may proceed with Cephalosporin use.   . Sulfa Antibiotics Rash      Outpatient Encounter Medications as of 06/06/2019  Medication Sig  . acetaminophen (TYLENOL) 325 MG tablet Take 2 tablets (650 mg total) by mouth every 6 (six) hours as needed for mild pain (or Fever >/= 101).  Marland Kitchen amLODipine (NORVASC) 10 MG tablet TAKE 1 TABLET BY MOUTH ONCE DAILY (Patient taking differently: Take 10 mg by mouth daily. )  . Ascorbic Acid (VITAMIN C) 1000 MG tablet Take 1,000 mg by mouth daily.  Marland Kitchen aspirin EC 81 MG tablet Take 81 mg by mouth daily.  Marland Kitchen azelastine (ASTELIN) 0.1 % nasal spray Place 2 sprays into both nostrils 2 (two) times daily. Use in each nostril as directed  .  Carboxymethylcellul-Glycerin (LUBRICATING EYE DROPS OP) Apply to eye.  Marland Kitchen Fexofenadine HCl (ALLEGRA PO) Take by mouth.  Marland Kitchen glucose blood (ONETOUCH VERIO) test strip 1 each by Other route as needed for other. Use as instructed to check blood pressure 2 times per day dx: e11.65  . hydrochlorothiazide (MICROZIDE) 12.5 MG capsule Take 1 capsule (12.5 mg total) by mouth daily.  . irbesartan (AVAPRO) 150 MG tablet TAKE 1 TABLET BY MOUTH EVERY DAY.  Marland Kitchen loperamide (IMODIUM) 2 MG capsule Take 1 capsule (2 mg total) by mouth 4 (four) times daily as needed for diarrhea or loose stools.  . Multiple Vitamins-Minerals (STRESS TAB NF PO) Take 1 tablet by mouth daily.  Marland Kitchen omeprazole (PRILOSEC) 20 MG capsule TAKE 1 CAPSULE BY MOUTH ONCE DAILY 30 MINUTES TO 1 HOUR BEFORE A MEAL (Patient taking differently: Take 20 mg by mouth daily. )  . rosuvastatin (CRESTOR) 20 MG tablet TAKE 1 TABLET BY MOUTH  ONCE DAILY (Patient taking differently: Take 20 mg by mouth daily. )  . trolamine salicylate (ASPERCREME) 10 % cream Apply 1 application topically as needed for muscle pain.  . vitamin B-12 (CYANOCOBALAMIN) 1000 MCG tablet Take 1,000 mcg by mouth daily.  . [DISCONTINUED] Eluxadoline (VIBERZI) 75 MG TABS Take 75 mg by mouth daily. (Patient not taking: Reported on 05/26/2019)  . [DISCONTINUED] ondansetron (ZOFRAN-ODT) 4 MG disintegrating tablet Take 1 tablet (4 mg total) by mouth every 8 (eight) hours as needed for nausea or vomiting. (Patient not taking: Reported on 05/26/2019)   No facility-administered encounter medications on file as of 06/06/2019.      REVIEW OF SYSTEMS  : All other systems reviewed and negative except where noted in the History of Present Illness.   PHYSICAL EXAM: BP 110/70   Pulse 96   Temp 98.2 F (36.8 C)   Wt 139 lb (63 kg)   LMP  (LMP Unknown)   BMI 24.16 kg/m  General: Well developed black female in no acute distress Head: Normocephalic and atraumatic Eyes:  Sclerae anicteric, conjunctiva  pink. Ears: Normal auditory acuity Lungs: Clear throughout to auscultation; no increased WOB Heart: Regular rate and rhythm; no M/R/G. Abdomen: Soft, non-distended.  BS present.  Non-tender. Musculoskeletal: Symmetrical with no gross deformities  Skin: No lesions on visible extremities Extremities: No edema  Neurological: Alert oriented x 4, grossly non-focal Psychological:  Alert and cooperative. Normal mood and affect  ASSESSMENT AND PLAN: *84 year old female with complaints of intermittent diarrhea/loose stools and generalized abdominal pain since an episode of shingles in June.  CT scan and extensive lab studies have been unremarkable.  She reports a colonoscopy just a few years ago with Dr. Earlean Shawl.  She is a little bit hard to follow with her symptoms and I am not exactly sure what is going on.  Maybe she had some type of infectious process and has some postinfectious IBS.  Keeps talking about her nerves so ? IBS-D.  I have asked her to begin a daily probiotic in the form of align or Florastor.  She was actually given some samples of align.  I am going to start her on dicyclomine 10 mg twice daily.  Prescription sent.  We will have her follow-up with me in 3 to 4 weeks.  **Will try to obtain colonoscopy records from Dr. Earlean Shawl.  **I received colonoscopy records from Dr. Earlean Shawl.  It appears that her last colonoscopy with him was April 2010 at which time she had a 1 cm pedunculated polyp removed as well as diverticulosis in the sigmoid colon that was extensive.  They say she had inadequate preparation.  They recommended a follow-up colonoscopy in 1 year because of finding the large sigmoid polyp and inadequate visualization due to poor preparation.  On pathology the polyp returned as benign polypoid colonic mucosa with underlying benign vascular proliferation, however.  Upon the patient's return visit I will make sure she has not had any other colonoscopies more recently with [facility] as she had  seen them in the more recent past at least during a hospitalization (not sure about outpatient setting).   CC:  Glendale Chard, MD

## 2019-06-06 NOTE — Patient Instructions (Signed)
If you are age 82 or older, your body mass index should be between 23-30. Your Body mass index is 24.16 kg/m. If this is out of the aforementioned range listed, please consider follow up with your Primary Care Provider.  If you are age 35 or younger, your body mass index should be between 19-25. Your Body mass index is 24.16 kg/m. If this is out of the aformentioned range listed, please consider follow up with your Primary Care Provider.   We have sent the following medications to your pharmacy for you to pick up at your convenience: Dicyclomine  Try Probiotic such as Clinical cytogeneticist.  We are requesting records from Dr. Earlean Shawl.  Follow up with me on 06/28/19 at 2 pm.  Thank you for choosing me and [facility].   Alonza Bogus, PA-C

## 2019-06-07 ENCOUNTER — Encounter: Payer: Self-pay | Admitting: Gastroenterology

## 2019-06-07 DIAGNOSIS — R195 Other fecal abnormalities: Secondary | ICD-10-CM | POA: Insufficient documentation

## 2019-06-07 NOTE — Progress Notes (Signed)
I agree with the above note, plan 

## 2019-06-13 ENCOUNTER — Telehealth: Payer: Self-pay

## 2019-06-14 ENCOUNTER — Ambulatory Visit
Admission: RE | Admit: 2019-06-14 | Discharge: 2019-06-14 | Disposition: A | Discharge status: Home / Self Care | Payer: Medicare Other | Source: Ambulatory Visit | Attending: Internal Medicine | Admitting: Internal Medicine

## 2019-06-14 ENCOUNTER — Other Ambulatory Visit: Payer: Self-pay

## 2019-06-14 DIAGNOSIS — Z1231 Encounter for screening mammogram for malignant neoplasm of breast: Secondary | ICD-10-CM

## 2019-06-15 DIAGNOSIS — J42 Unspecified chronic bronchitis: Secondary | ICD-10-CM | POA: Insufficient documentation

## 2019-06-15 MED ORDER — OLOPATADINE HCL 0.1 % OP SOLN: 1.0000 [drp] | Freq: Two times a day (BID) | OPHTHALMIC | 5 refills | Status: DC

## 2019-06-15 MED ORDER — ALBUTEROL SULFATE HFA 108 (90 BASE) MCG/ACT IN AERS: 1.0000 | INHALATION_SPRAY | RESPIRATORY_TRACT | 1 refills | Status: DC | PRN

## 2019-06-15 MED FILL — ALBUTEROL SULFATE HFA 108 (: 108 (90 BAS | 17 days supply | Qty: 18 | Fill #0

## 2019-06-15 MED FILL — OLOPATADINE HCL 0.1% EYE DR: 0.1 | 25 days supply | Qty: 5 | Fill #0

## 2019-06-15 NOTE — Telephone Encounter (Signed)
Patient verbalized understanding and will use the albuterol for now until her appt with Gareth Morgan on 07/15/2019. I also sent in Olopatadine 0.1 since she was completely out of the eye drops.

## 2019-06-15 NOTE — Addendum Note (Signed)
Addended by: Valere Dross on: 06/15/2019 02:05 PM   Modules accepted: Orders

## 2019-06-15 NOTE — Telephone Encounter (Signed)
Thanks

## 2019-06-15 NOTE — Telephone Encounter (Signed)
Patient called back stating she still has a bad cough and would like to know what to do to get rid of it. Patient states that she has had this cough since May when she had shingles. Patients says she is only taking Allegra daily.   Please advise.

## 2019-06-15 NOTE — Telephone Encounter (Signed)
She has been struggling with this cough for months now...likely chronic bronchitis.  We did provide with antibiotic course several months ago for same cough.   At this time would want her to have access to albuterol inhaler to see if this helps to stop the cough when used.  Please prescribe with spacer.   Please schedule her a visit with myself or Webb Silversmith (if on Friday) in about a month to allow her to use albuterol and see if this helps.    Her routine regimen for her allergies should include:  - continue daily Zyrtec 10mg  or Allegra 180mg   - nasal saline rinse 1-2 times a day to help flush out sinuses  - for nasal drainage Astelin (azelastine) 2 sprays each nostril twice a day.    - for nasal congestion if needed use OTC mometasone 2 sprays each nostril 3 times a week (M,W,F) to help decrease risk of nosebleeds  - for itchy/watery/red eyes use Pazeo 1 drop each eye daily as needed

## 2019-06-28 ENCOUNTER — Encounter: Payer: Self-pay | Admitting: Gastroenterology

## 2019-06-28 ENCOUNTER — Ambulatory Visit (INDEPENDENT_AMBULATORY_CARE_PROVIDER_SITE_OTHER): Payer: Medicare HMO | Admitting: Gastroenterology

## 2019-06-28 VITALS — BP 144/70 | HR 106 | Ht 65.5 in | Wt 139.0 lb

## 2019-06-28 DIAGNOSIS — R195 Other fecal abnormalities: Secondary | ICD-10-CM

## 2019-06-28 DIAGNOSIS — R1084 Generalized abdominal pain: Secondary | ICD-10-CM | POA: Diagnosis not present

## 2019-06-28 NOTE — Patient Instructions (Signed)
If you are age 82 or older, your body mass index should be between 23-30. Your Body mass index is 22.78 kg/m. If this is out of the aforementioned range listed, please consider follow up with your Primary Care Provider.  If you are age 45 or younger, your body mass index should be between 19-25. Your Body mass index is 22.78 kg/m. If this is out of the aformentioned range listed, please consider follow up with your Primary Care Provider.   Continue Probiotic as directed.  Continue Dicyclomine as needed.   Follow-up as needed.   Thank you for choosing me and [facility].  Janett Billow Zehr-PA

## 2019-06-28 NOTE — Progress Notes (Signed)
06/28/2019 Anna Hoffman [id number] 11/01/1934   HISTORY OF PRESENT ILLNESS: This is a pleasant 84 year old female who I saw in the office on November 2 for complaints of generalized abdominal pain and loose stools.  Please see my office note from that day for further details surrounding her symptoms.  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  I started her on a probiotic, which she has been taking and also prescribed dicyclomine twice a day.  She says that she is only use that on an as-needed basis.  Overall she feels like she is doing much better.  Her weight is stable.  She says that as long as she does not eat certain fruits such as apples or a lot of leafy green vegetables then she does just fine.  Having normal bowel movements as long as she avoids those items.  No blood in the stool.    Past Medical History:  Diagnosis Date  . Arthritis   . Diabetes mellitus without complication ([place])   . Hypertension   . Hypokalemia   . Pneumonia   . Shingles   . Stroke [facility])    Past Surgical History:  Procedure Laterality Date  . ABDOMINAL HYSTERECTOMY    . BREAST EXCISIONAL BIOPSY Left   . ESOPHAGOGASTRODUODENOSCOPY N/A 09/01/2014   Procedure: ESOPHAGOGASTRODUODENOSCOPY (EGD);  Surgeon: Lear Ng, MD;  Location: [facility];  Service: Endoscopy;  Laterality: N/A;  . HIATAL HERNIA REPAIR N/A 09/04/2014   Procedure: LAPAROSCOPIC REPAIR OF HIATAL HERNIA;  Surgeon: Excell Seltzer, MD;  Location: [facility];  Service: General;  Laterality: N/A;  With MESH  . IR GENERIC HISTORICAL  04/10/2016   IR US GUIDE VASC ACCESS RIGHT 04/10/2016 Corrie Mckusick, DO [facility]  . IR GENERIC HISTORICAL  04/10/2016   IR ANGIOGRAM SELECTIVE EACH ADDITIONAL VESSEL 04/10/2016 Corrie Mckusick, DO [facility]  . IR GENERIC HISTORICAL  04/10/2016   IR ANGIOGRAM SELECTIVE EACH ADDITIONAL  VESSEL 04/10/2016 Corrie Mckusick, DO [facility]  . IR GENERIC HISTORICAL  04/10/2016   IR EMBO TUMOR ORGAN ISCHEMIA INFARCT INC GUIDE ROADMAPPING 04/10/2016 Corrie Mckusick, DO [facility]  . IR GENERIC HISTORICAL  04/10/2016   IR ANGIOGRAM SELECTIVE EACH ADDITIONAL VESSEL 04/10/2016 Corrie Mckusick, DO [facility]  . IR GENERIC HISTORICAL  04/10/2016   IR ANGIOGRAM SELECTIVE EACH ADDITIONAL VESSEL 04/10/2016 Corrie Mckusick, DO [facility]  . IR GENERIC HISTORICAL  04/10/2016   IR RENAL SELECTIVE  UNI INC S&I MOD SED 04/10/2016 Corrie Mckusick, DO [facility]  . IR GENERIC HISTORICAL  03/06/2016   IR RADIOLOGIST EVAL & MGMT 03/06/2016 Corrie Mckusick, DO [facility]  . IR GENERIC HISTORICAL  03/26/2016   IR RADIOLOGIST EVAL & MGMT 03/26/2016 Corrie Mckusick, DO [facility]  . IR GENERIC HISTORICAL  04/29/2016   IR RADIOLOGIST EVAL & MGMT 04/29/2016 [facility]  . IR RADIOLOGIST EVAL & MGMT  11/12/2016  . IR RADIOLOGIST EVAL & MGMT  12/16/2017  . KNEE SURGERY    . TONSILLECTOMY      reports that she has never smoked. She has never used smokeless tobacco. She reports that she does not drink alcohol or use drugs. family history includes Alzheimer's disease in her mother; Brain cancer in her brother and brother; Pancreatic cancer in her sister; Prostate cancer in her father. Allergies  Allergen Reactions  .  Amoxicillin Diarrhea    Has patient had a PCN reaction causing immediate rash, facial/tongue/throat swelling, SOB or lightheadedness with hypotension: no Has patient had a PCN reaction causing severe rash involving mucus membranes or skin necrosis: no Has patient had a PCN reaction that required hospitalization: no pharmacist consult Has patient had a PCN reaction occurring within the last 10 years: yes If all of the above answers are "NO", then may proceed with Cephalosporin use.   . Ampicillin Rash    Has patient had a PCN reaction causing immediate rash, facial/tongue/throat swelling, SOB or  lightheadedness with hypotension: No Has patient had a PCN reaction causing severe rash involving mucus membranes or skin necrosis: No Has patient had a PCN reaction that required hospitalization No Has patient had a PCN reaction occurring within the last 10 years: No If all of the above answers are "NO", then may proceed with Cephalosporin use.   . Sulfa Antibiotics Rash      Outpatient Encounter Medications as of 06/28/2019  Medication Sig  . acetaminophen (TYLENOL) 325 MG tablet Take 2 tablets (650 mg total) by mouth every 6 (six) hours as needed for mild pain (or Fever >/= 101).  Marland Kitchen albuterol (VENTOLIN HFA) 108 (90 Base) MCG/ACT inhaler Inhale 1-2 puffs into the lungs every 4 (four) hours as needed for wheezing or shortness of breath.  Marland Kitchen amLODipine (NORVASC) 10 MG tablet TAKE 1 TABLET BY MOUTH ONCE DAILY (Patient taking differently: Take 10 mg by mouth daily. )  . Ascorbic Acid (VITAMIN C) 1000 MG tablet Take 1,000 mg by mouth daily.  Marland Kitchen aspirin EC 81 MG tablet Take 81 mg by mouth daily.  Marland Kitchen azelastine (ASTELIN) 0.1 % nasal spray Place 2 sprays into both nostrils 2 (two) times daily. Use in each nostril as directed  . Carboxymethylcellul-Glycerin (LUBRICATING EYE DROPS OP) Apply to eye.  . dicyclomine (BENTYL) 10 MG capsule Take 1 capsule (10 mg total) by mouth 2 (two) times daily.  Marland Kitchen Fexofenadine HCl (ALLEGRA PO) Take by mouth.  Marland Kitchen glucose blood (ONETOUCH VERIO) test strip 1 each by Other route as needed for other. Use as instructed to check blood pressure 2 times per day dx: e11.65  . hydrochlorothiazide (MICROZIDE) 12.5 MG capsule Take 1 capsule (12.5 mg total) by mouth daily.  . irbesartan (AVAPRO) 150 MG tablet TAKE 1 TABLET BY MOUTH EVERY DAY.  Marland Kitchen loperamide (IMODIUM) 2 MG capsule Take 1 capsule (2 mg total) by mouth 4 (four) times daily as needed for diarrhea or loose stools.  . Multiple Vitamins-Minerals (STRESS TAB NF PO) Take 1 tablet by mouth daily.  Marland Kitchen olopatadine (PATANOL) 0.1 %  ophthalmic solution Place 1 drop into both eyes 2 (two) times daily.  Marland Kitchen omeprazole (PRILOSEC) 20 MG capsule TAKE 1 CAPSULE BY MOUTH ONCE DAILY 30 MINUTES TO 1 HOUR BEFORE A MEAL (Patient taking differently: Take 20 mg by mouth daily. )  . rosuvastatin (CRESTOR) 20 MG tablet TAKE 1 TABLET BY MOUTH ONCE DAILY (Patient taking differently: Take 20 mg by mouth daily. )  . trolamine salicylate (ASPERCREME) 10 % cream Apply 1 application topically as needed for muscle pain.  . vitamin B-12 (CYANOCOBALAMIN) 1000 MCG tablet Take 1,000 mcg by mouth daily.   No facility-administered encounter medications on file as of 06/28/2019.      REVIEW OF SYSTEMS  : All other systems reviewed and negative except where noted in the History of Present Illness.   PHYSICAL EXAM: BP (!) 144/70   Pulse (!) 106  Ht 5' 5.5" (1.664 m)   Wt 139 lb (63 kg)   LMP  (LMP Unknown)   BMI 22.78 kg/m  General: Well developed female in no acute distress Head: Normocephalic and atraumatic Eyes:  Sclerae anicteric, conjunctiva pink. Ears: Normal auditory acuity Lungs: Clear throughout to auscultation; no increased WOB. Heart: Regular rate and rhythm; no M/R/G. Abdomen: Soft, non-distended.  BS present.  Non-tender. Musculoskeletal: Symmetrical with no gross deformities  Skin: No lesions on visible extremities Extremities: No edema  Neurological: Alert oriented x 4, grossly non-focal Psychological:  Alert and cooperative. Normal mood and affect  ASSESSMENT AND PLAN: *84 year old female with complaints of intermittent diarrhea/loose stools and generalized abdominal pain.  She admits that this is much improved since I last saw her about 3 weeks ago.  She says as long as she does not eat apples or a lot of leafy greens then she tends to do just fine.  I think she likely has a component of IBS as she talks about worrying about family and her nerves, etc.  She will continue the align probiotic and can use the dicyclomine that I  prescribed as needed.  We will follow-up here as needed for any further issues.   CC:  Glendale Chard, MD

## 2019-06-29 NOTE — Progress Notes (Signed)
I agree with the above note, plan 

## 2019-07-11 ENCOUNTER — Encounter (HOSPITAL_COMMUNITY): Payer: Self-pay | Admitting: Obstetrics and Gynecology

## 2019-07-11 ENCOUNTER — Other Ambulatory Visit: Payer: Self-pay

## 2019-07-11 ENCOUNTER — Inpatient Hospital Stay (HOSPITAL_COMMUNITY)
Admission: EM | Admit: 2019-07-11 | Discharge: 2019-07-14 | DRG: 392 | Disposition: A | Discharge status: Home / Self Care | Payer: Medicare HMO | Attending: Internal Medicine | Admitting: Internal Medicine

## 2019-07-11 ENCOUNTER — Emergency Department (HOSPITAL_COMMUNITY): Payer: Medicare HMO

## 2019-07-11 DIAGNOSIS — R112 Nausea with vomiting, unspecified: Secondary | ICD-10-CM | POA: Diagnosis not present

## 2019-07-11 DIAGNOSIS — E785 Hyperlipidemia, unspecified: Secondary | ICD-10-CM | POA: Diagnosis not present

## 2019-07-11 DIAGNOSIS — E1122 Type 2 diabetes mellitus with diabetic chronic kidney disease: Secondary | ICD-10-CM | POA: Diagnosis present

## 2019-07-11 DIAGNOSIS — I129 Hypertensive chronic kidney disease with stage 1 through stage 4 chronic kidney disease, or unspecified chronic kidney disease: Secondary | ICD-10-CM | POA: Diagnosis present

## 2019-07-11 DIAGNOSIS — N183 Chronic kidney disease, stage 3 unspecified: Secondary | ICD-10-CM | POA: Diagnosis not present

## 2019-07-11 DIAGNOSIS — Z8673 Personal history of transient ischemic attack (TIA), and cerebral infarction without residual deficits: Secondary | ICD-10-CM

## 2019-07-11 DIAGNOSIS — R197 Diarrhea, unspecified: Secondary | ICD-10-CM | POA: Diagnosis not present

## 2019-07-11 DIAGNOSIS — Z820 Family history of epilepsy and other diseases of the nervous system: Secondary | ICD-10-CM | POA: Diagnosis not present

## 2019-07-11 DIAGNOSIS — I10 Essential (primary) hypertension: Secondary | ICD-10-CM | POA: Diagnosis present

## 2019-07-11 DIAGNOSIS — R935 Abnormal findings on diagnostic imaging of other abdominal regions, including retroperitoneum: Secondary | ICD-10-CM | POA: Diagnosis not present

## 2019-07-11 DIAGNOSIS — Z808 Family history of malignant neoplasm of other organs or systems: Secondary | ICD-10-CM

## 2019-07-11 DIAGNOSIS — R509 Fever, unspecified: Secondary | ICD-10-CM | POA: Diagnosis not present

## 2019-07-11 DIAGNOSIS — Z7982 Long term (current) use of aspirin: Secondary | ICD-10-CM | POA: Diagnosis not present

## 2019-07-11 DIAGNOSIS — R1110 Vomiting, unspecified: Secondary | ICD-10-CM | POA: Diagnosis not present

## 2019-07-11 DIAGNOSIS — A09 Infectious gastroenteritis and colitis, unspecified: Secondary | ICD-10-CM | POA: Diagnosis not present

## 2019-07-11 DIAGNOSIS — Z8042 Family history of malignant neoplasm of prostate: Secondary | ICD-10-CM | POA: Diagnosis not present

## 2019-07-11 DIAGNOSIS — Z20828 Contact with and (suspected) exposure to other viral communicable diseases: Secondary | ICD-10-CM | POA: Diagnosis not present

## 2019-07-11 DIAGNOSIS — K529 Noninfective gastroenteritis and colitis, unspecified: Secondary | ICD-10-CM | POA: Diagnosis not present

## 2019-07-11 DIAGNOSIS — R1030 Lower abdominal pain, unspecified: Secondary | ICD-10-CM

## 2019-07-11 DIAGNOSIS — Z800 Family history of malignant neoplasm of digestive organs: Secondary | ICD-10-CM | POA: Diagnosis not present

## 2019-07-11 DIAGNOSIS — R1013 Epigastric pain: Secondary | ICD-10-CM | POA: Diagnosis not present

## 2019-07-11 LAB — URINALYSIS, ROUTINE W REFLEX MICROSCOPIC
Bilirubin Urine: NEGATIVE
Glucose, UA: NEGATIVE mg/dL
Hgb urine dipstick: NEGATIVE
Ketones, ur: NEGATIVE mg/dL
Leukocytes,Ua: NEGATIVE
Nitrite: NEGATIVE
Protein, ur: 30 mg/dL — AB
Specific Gravity, Urine: 1.018 (ref 1.005–1.030)
pH: 6 (ref 5.0–8.0)

## 2019-07-11 LAB — COMPREHENSIVE METABOLIC PANEL
ALT: 17 U/L (ref 0–44)
AST: 19 U/L (ref 15–41)
Albumin: 4.4 g/dL (ref 3.5–5.0)
Alkaline Phosphatase: 82 U/L (ref 38–126)
Anion gap: 14 (ref 5–15)
BUN: 20 mg/dL (ref 8–23)
CO2: 21 mmol/L — ABNORMAL LOW (ref 22–32)
Calcium: 9.3 mg/dL (ref 8.9–10.3)
Chloride: 107 mmol/L (ref 98–111)
Creatinine, Ser: 0.97 mg/dL (ref 0.44–1.00)
GFR calc Af Amer: >60 mL/min (ref >60)
GFR calc non Af Amer: 54 mL/min — ABNORMAL LOW (ref >60)
Glucose, Bld: 101 mg/dL — ABNORMAL HIGH (ref 70–99)
Potassium: 3.6 mmol/L (ref 3.5–5.1)
Sodium: 142 mmol/L (ref 135–145)
Total Bilirubin: 0.6 mg/dL (ref 0.3–1.2)
Total Protein: 7.7 g/dL (ref 6.5–8.1)

## 2019-07-11 LAB — LACTIC ACID, PLASMA
Lactic Acid, Venous: 1.2 mmol/L (ref 0.5–1.9)
Lactic Acid, Venous: 0.8 mmol/L (ref 0.5–1.9)

## 2019-07-11 LAB — CBC
HCT: 44.1 % (ref 36.0–46.0)
Hemoglobin: 13.8 g/dL (ref 12.0–15.0)
MCH: 28.6 pg (ref 26.0–34.0)
MCHC: 31.3 g/dL (ref 30.0–36.0)
MCV: 91.5 fL (ref 80.0–100.0)
Platelets: 370 10*3/uL (ref 150–400)
RBC: 4.82 MIL/uL (ref 3.87–5.11)
RDW: 14.9 % (ref 11.5–15.5)
WBC: 14.3 10*3/uL — ABNORMAL HIGH (ref 4.0–10.5)
nRBC: 0 % (ref 0.0–0.2)

## 2019-07-11 LAB — PHOSPHORUS: Phosphorus: 3.5 mg/dL (ref 2.5–4.6)

## 2019-07-11 LAB — LIPASE, BLOOD: Lipase: 18 U/L (ref 11–51)

## 2019-07-11 LAB — CBG MONITORING, ED
Glucose-Capillary: 80 mg/dL (ref 70–99)
Glucose-Capillary: 106 mg/dL — ABNORMAL HIGH (ref 70–99)

## 2019-07-11 LAB — MAGNESIUM: Magnesium: 2.3 mg/dL (ref 1.7–2.4)

## 2019-07-11 MED ORDER — SODIUM CHLORIDE 0.9 % IV SOLN
INTRAVENOUS | Status: DC | PRN
  Administered 2019-07-11: 500 mL via INTRAVENOUS

## 2019-07-11 MED ORDER — CARBOXYMETHYLCELLUL-GLYCERIN 0.5-0.9 % OP SOLN: 1.0000 [drp] | Freq: Three times a day (TID) | OPHTHALMIC | Status: DC | PRN

## 2019-07-11 MED ORDER — ROSUVASTATIN CALCIUM 20 MG PO TABS
20.0000 mg | ORAL_TABLET | Freq: Every day | ORAL | Status: DC
  Administered 2019-07-11 – 2019-07-14 (×4): 20 mg via ORAL
  Filled 2019-07-11 (×4): qty 1

## 2019-07-11 MED ORDER — SODIUM CHLORIDE 0.9 % IV BOLUS
500.0000 mL | Freq: Once | INTRAVENOUS | Status: AC
  Administered 2019-07-11: 500 mL via INTRAVENOUS

## 2019-07-11 MED ORDER — CIPROFLOXACIN IN D5W 400 MG/200ML IV SOLN
400.0000 mg | Freq: Once | INTRAVENOUS | Status: AC
  Administered 2019-07-11: 400 mg via INTRAVENOUS
  Filled 2019-07-11: qty 200

## 2019-07-11 MED ORDER — LORATADINE 10 MG PO TABS
10.0000 mg | ORAL_TABLET | Freq: Every day | ORAL | Status: DC
  Administered 2019-07-11 – 2019-07-14 (×4): 10 mg via ORAL
  Filled 2019-07-11 (×4): qty 1

## 2019-07-11 MED ORDER — IOHEXOL 300 MG/ML  SOLN
100.0000 mL | Freq: Once | INTRAMUSCULAR | Status: AC | PRN
  Administered 2019-07-11: 100 mL via INTRAVENOUS

## 2019-07-11 MED ORDER — METRONIDAZOLE IN NACL 5-0.79 MG/ML-% IV SOLN
500.0000 mg | Freq: Once | INTRAVENOUS | Status: AC
  Administered 2019-07-11: 500 mg via INTRAVENOUS
  Filled 2019-07-11: qty 100

## 2019-07-11 MED ORDER — ONDANSETRON HCL 4 MG/2ML IJ SOLN: 4.0000 mg | Freq: Four times a day (QID) | INTRAMUSCULAR | Status: DC | PRN

## 2019-07-11 MED ORDER — DICYCLOMINE HCL 10 MG PO CAPS
10.0000 mg | ORAL_CAPSULE | Freq: Two times a day (BID) | ORAL | Status: DC
  Administered 2019-07-11 – 2019-07-14 (×6): 10 mg via ORAL
  Filled 2019-07-11 (×7): qty 1

## 2019-07-11 MED ORDER — ASPIRIN EC 81 MG PO TBEC
81.0000 mg | DELAYED_RELEASE_TABLET | Freq: Every day | ORAL | Status: DC
  Administered 2019-07-11 – 2019-07-14 (×4): 81 mg via ORAL
  Filled 2019-07-11 (×4): qty 1

## 2019-07-11 MED ORDER — ONDANSETRON 4 MG PO TBDP: 4.0000 mg | ORAL_TABLET | Freq: Once | ORAL | Status: DC | PRN

## 2019-07-11 MED ORDER — PANTOPRAZOLE SODIUM 40 MG PO TBEC
40.0000 mg | DELAYED_RELEASE_TABLET | Freq: Every day | ORAL | Status: DC
  Administered 2019-07-11 – 2019-07-14 (×4): 40 mg via ORAL
  Filled 2019-07-11 (×4): qty 1

## 2019-07-11 MED ORDER — METRONIDAZOLE IN NACL 5-0.79 MG/ML-% IV SOLN
500.0000 mg | Freq: Three times a day (TID) | INTRAVENOUS | Status: DC
  Administered 2019-07-12 – 2019-07-13 (×5): 500 mg via INTRAVENOUS
  Filled 2019-07-11 (×5): qty 100

## 2019-07-11 MED ORDER — CIPROFLOXACIN IN D5W 400 MG/200ML IV SOLN
400.0000 mg | Freq: Two times a day (BID) | INTRAVENOUS | Status: DC
  Administered 2019-07-12 – 2019-07-13 (×3): 400 mg via INTRAVENOUS
  Filled 2019-07-11 (×5): qty 200

## 2019-07-11 MED ORDER — HYDRALAZINE HCL 25 MG PO TABS: 25.0000 mg | ORAL_TABLET | Freq: Four times a day (QID) | ORAL | Status: DC | PRN

## 2019-07-11 MED ORDER — VITAMIN C 500 MG PO TABS
1000.0000 mg | ORAL_TABLET | Freq: Every day | ORAL | Status: DC
  Administered 2019-07-11 – 2019-07-14 (×4): 1000 mg via ORAL
  Filled 2019-07-11 (×4): qty 2

## 2019-07-11 MED ORDER — ONDANSETRON HCL 4 MG/2ML IJ SOLN
4.0000 mg | Freq: Once | INTRAMUSCULAR | Status: AC
  Administered 2019-07-11: 4 mg via INTRAVENOUS
  Filled 2019-07-11: qty 2

## 2019-07-11 MED ORDER — ALBUTEROL SULFATE (2.5 MG/3ML) 0.083% IN NEBU: 2.5000 mg | INHALATION_SOLUTION | RESPIRATORY_TRACT | Status: DC | PRN

## 2019-07-11 MED ORDER — AMLODIPINE BESYLATE 10 MG PO TABS
10.0000 mg | ORAL_TABLET | Freq: Every day | ORAL | Status: DC
  Administered 2019-07-11 – 2019-07-14 (×4): 10 mg via ORAL
  Filled 2019-07-11 (×3): qty 1
  Filled 2019-07-11: qty 2

## 2019-07-11 MED ORDER — OLOPATADINE HCL 0.1 % OP SOLN
1.0000 [drp] | Freq: Two times a day (BID) | OPHTHALMIC | Status: DC
  Administered 2019-07-11 – 2019-07-14 (×6): 1 [drp] via OPHTHALMIC
  Filled 2019-07-11: qty 5

## 2019-07-11 MED ORDER — SODIUM CHLORIDE 0.9 % IV SOLN
Freq: Once | INTRAVENOUS | Status: AC
  Administered 2019-07-11: 20:00:00 via INTRAVENOUS

## 2019-07-11 MED ORDER — POTASSIUM CHLORIDE IN NACL 20-0.9 MEQ/L-% IV SOLN
INTRAVENOUS | Status: DC
  Administered 2019-07-11: 22:00:00 via INTRAVENOUS
  Filled 2019-07-11 (×2): qty 1000

## 2019-07-11 MED ORDER — AZELASTINE HCL 0.1 % NA SOLN
2.0000 | Freq: Two times a day (BID) | NASAL | Status: DC
  Administered 2019-07-11 – 2019-07-14 (×6): 2 via NASAL
  Filled 2019-07-11: qty 30

## 2019-07-11 MED ORDER — MORPHINE SULFATE (PF) 2 MG/ML IV SOLN
2.0000 mg | Freq: Once | INTRAVENOUS | Status: AC
  Administered 2019-07-11: 2 mg via INTRAVENOUS
  Filled 2019-07-11: qty 1

## 2019-07-11 MED ORDER — ENOXAPARIN SODIUM 40 MG/0.4ML ~~LOC~~ SOLN
40.0000 mg | SUBCUTANEOUS | Status: DC
  Administered 2019-07-11 – 2019-07-13 (×3): 40 mg via SUBCUTANEOUS
  Filled 2019-07-11 (×3): qty 0.4

## 2019-07-11 MED ORDER — ALBUTEROL SULFATE HFA 108 (90 BASE) MCG/ACT IN AERS
1.0000 | INHALATION_SPRAY | RESPIRATORY_TRACT | Status: DC | PRN
  Filled 2019-07-11: qty 6.7

## 2019-07-11 MED ORDER — VITAMIN B-12 1000 MCG PO TABS
1000.0000 ug | ORAL_TABLET | Freq: Every day | ORAL | Status: DC
  Administered 2019-07-12 – 2019-07-14 (×3): 1000 ug via ORAL
  Filled 2019-07-11 (×3): qty 1

## 2019-07-11 MED ORDER — POLYVINYL ALCOHOL 1.4 % OP SOLN: 1.0000 [drp] | OPHTHALMIC | Status: DC | PRN

## 2019-07-11 MED ORDER — SODIUM CHLORIDE (PF) 0.9 % IJ SOLN
INTRAMUSCULAR | Status: AC
  Administered 2019-07-11: 18:00:00
  Filled 2019-07-11: qty 50

## 2019-07-11 MED ORDER — IRBESARTAN 150 MG PO TABS
150.0000 mg | ORAL_TABLET | Freq: Every day | ORAL | Status: DC
  Administered 2019-07-11 – 2019-07-14 (×4): 150 mg via ORAL
  Filled 2019-07-11 (×4): qty 1

## 2019-07-11 MED ORDER — INSULIN ASPART 100 UNIT/ML ~~LOC~~ SOLN
0.0000 [IU] | Freq: Three times a day (TID) | SUBCUTANEOUS | Status: DC
  Filled 2019-07-11: qty 0.06

## 2019-07-11 NOTE — ED Notes (Addendum)
Pt placed on 2L nasal cannula and repositioned. Pt SpO2 went from 91% to 98%. Tiffany, RN on [employer]. Pt being transported to 1313 now.

## 2019-07-11 NOTE — ED Provider Notes (Signed)
[facility] DEPT Provider Note   CSN: [id number] Arrival date & time: 07/11/19  1101     History   Chief Complaint Chief Complaint  Patient presents with  . Emesis  . Diarrhea    HPI Syrena Burges is a 84 y.o. female with history of diabetes mellitus, hypertension, CVA, stroke, hyperlipidemia presents for evaluation of acute onset, persistent nausea vomiting diarrhea since last night.  Reports symptoms began a few hours after eating a biscuit, piece of chicken, macaroni and cheese, and sweet potato pie for dinner.  Has had several episodes of nonbloody nonbilious emesis and watery nonbloody diarrhea.  Reports sharp lower abdominal pain and more dull upper abdominal pain.  Took Pepto-Bismol without relief.  Reports that she was running a low-grade fever around 100 F last night.  Denies chest pain, shortness of breath, or cough aside from her baseline chronic cough.  No known sick contacts.     The history is provided by the patient.    Past Medical History:  Diagnosis Date  . Arthritis   . Diabetes mellitus without complication ([place])   . Hypertension   . Hypokalemia   . Pneumonia   . Shingles   . Stroke [facility])     Patient Active Problem List   Diagnosis Date Noted  . Chronic bronchitis ([place]) 06/15/2019  . Loose stools 06/07/2019  . Herpes zoster without complication 50/93/2671  . Other long term (current) drug therapy 08/12/2018  . Chronic renal disease, stage III 07/19/2018  . Hypertensive nephropathy 07/19/2018  . Community acquired pneumonia of right upper lobe of lung 11/07/2017  . Hypertensive emergency 07/01/2016  . Thalamic hemorrhage ([place]) 07/01/2016  . 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 Benign essential HTN   . Type 2 diabetes mellitus with stage 3 chronic kidney disease, without long-term current use of insulin ([place])   . Mixed hyperlipidemia   . ICH (intracerebral hemorrhage) ([place]) - hypertensive R thalamic hemorrhage   06/27/2016  . 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 Retroperitoneal bleed 02/08/2016  . Generalized abdominal pain   . Gastroenteritis 02/04/2016  . Diarrhea 02/04/2016  . Hypokalemia 02/04/2016  . Incarcerated paraesophageal hernia 09/02/2014  . Renal mass, left 09/02/2014  . Acute esophagitis 09/02/2014  . Gastric outlet obstruction 09/02/2014  . Hypertension   . Vomiting 09/01/2014    Past Surgical History:  Procedure Laterality Date  . ABDOMINAL HYSTERECTOMY    . BREAST EXCISIONAL BIOPSY Left   . ESOPHAGOGASTRODUODENOSCOPY N/A 09/01/2014   Procedure: ESOPHAGOGASTRODUODENOSCOPY (EGD);  Surgeon: Lear Ng, MD;  Location: [facility];  Service: Endoscopy;  Laterality: N/A;  . HIATAL HERNIA REPAIR N/A 09/04/2014   Procedure: LAPAROSCOPIC REPAIR OF HIATAL HERNIA;  Surgeon: Excell Seltzer, MD;  Location[facility];  Service: General;  Laterality: N/A;  With MESH  . IR GENERIC HISTORICAL  04/10/2016   IR US GUIDE VASC ACCESS RIGHT 04/10/2016 Corrie Mckusick, DO [facility]  . IR GENERIC HISTORICAL  04/10/2016   IR ANGIOGRAM SELECTIVE EACH ADDITIONAL VESSEL 04/10/2016 Corrie Mckusick, DO [facility]  . IR GENERIC HISTORICAL  04/10/2016   IR ANGIOGRAM SELECTIVE EACH ADDITIONAL VESSEL 04/10/2016 Corrie Mckusick, DO [facility]  . IR GENERIC HISTORICAL  04/10/2016   IR EMBO TUMOR ORGAN ISCHEMIA INFARCT INC GUIDE ROADMAPPING 04/10/2016 Corrie Mckusick, DO [facility]  . IR GENERIC HISTORICAL  04/10/2016   IR ANGIOGRAM SELECTIVE EACH ADDITIONAL VESSEL 04/10/2016 Corrie Mckusick, DO [facility]  . IR GENERIC HISTORICAL  04/10/2016  IR ANGIOGRAM SELECTIVE EACH ADDITIONAL VESSEL 04/10/2016 Corrie Mckusick, DO [facility]  . IR GENERIC HISTORICAL  04/10/2016   IR RENAL SELECTIVE  UNI INC S&I MOD SED 04/10/2016 Corrie Mckusick, DO [facility]  . IR GENERIC HISTORICAL  03/06/2016   IR RADIOLOGIST EVAL & MGMT 03/06/2016 Corrie Mckusick, DO [facility]  . IR GENERIC HISTORICAL  03/26/2016   IR RADIOLOGIST EVAL &  MGMT 03/26/2016 Corrie Mckusick, DO [facility]  . IR GENERIC HISTORICAL  04/29/2016   IR RADIOLOGIST EVAL & MGMT 04/29/2016 [facility]  . IR RADIOLOGIST EVAL & MGMT  11/12/2016  . IR RADIOLOGIST EVAL & MGMT  12/16/2017  . KNEE SURGERY    . TONSILLECTOMY       OB History   No obstetric history on file.      Home Medications    Prior to Admission medications   Medication Sig Start Date End Date Taking? Authorizing Provider  albuterol (VENTOLIN HFA) 108 (90 Base) MCG/ACT inhaler Inhale 1-2 puffs into the lungs every 4 (four) hours as needed for wheezing or shortness of breath. 06/15/19  Yes Padgett, Rae Halsted, MD  amLODipine (NORVASC) 10 MG tablet TAKE 1 TABLET BY MOUTH ONCE DAILY Patient taking differently: Take 10 mg by mouth daily.  10/29/18  Yes Glendale Chard, MD  Ascorbic Acid (VITAMIN C) 1000 MG tablet Take 1,000 mg by mouth daily.   Yes [provider]  aspirin EC 81 MG tablet Take 81 mg by mouth daily.   Yes [provider]  azelastine (ASTELIN) 0.1 % nasal spray Place 2 sprays into both nostrils 2 (two) times daily. Use in each nostril as directed 10/06/18  Yes Padgett, Rae Halsted, MD  Carboxymethylcellul-Glycerin (LUBRICATING EYE DROPS OP) Apply 1 drop to eye daily as needed (dry eyes).    Yes [provider]  dicyclomine (BENTYL) 10 MG capsule Take 1 capsule (10 mg total) by mouth 2 (two) times daily. 06/06/19  Yes Zehr, Laban Emperor, PA-C  Fexofenadine HCl (ALLEGRA PO) Take 1 capsule by mouth daily.    Yes [provider]  hydrochlorothiazide (MICROZIDE) 12.5 MG capsule Take 1 capsule (12.5 mg total) by mouth daily. Patient taking differently: Take 12.5 mg by mouth daily as needed (fluid).  04/08/19 04/07/20 Yes Glendale Chard, MD  irbesartan (AVAPRO) 150 MG tablet TAKE 1 TABLET BY MOUTH EVERY DAY. 03/17/19  Yes Glendale Chard, MD  loperamide (IMODIUM) 2 MG capsule Take 1 capsule (2 mg total) by mouth 4 (four) times daily as needed  for diarrhea or loose stools. 02/13/19  Yes Couture, Cortni S, PA-C  Multiple Vitamins-Minerals (STRESS TAB NF PO) Take 1 tablet by mouth daily.   Yes [provider]  olopatadine (PATANOL) 0.1 % ophthalmic solution Place 1 drop into both eyes 2 (two) times daily. 06/15/19  Yes Padgett, Rae Halsted, MD  omeprazole (PRILOSEC) 20 MG capsule TAKE 1 CAPSULE BY MOUTH ONCE DAILY 30 MINUTES TO 1 HOUR BEFORE A MEAL Patient taking differently: Take 20 mg by mouth daily.  10/29/18  Yes Glendale Chard, MD  rosuvastatin (CRESTOR) 20 MG tablet TAKE 1 TABLET BY MOUTH ONCE DAILY Patient taking differently: Take 20 mg by mouth daily.  10/29/18  Yes Glendale Chard, MD  trolamine salicylate (ASPERCREME) 10 % cream Apply 1 application topically as needed for muscle pain.   Yes [provider]  vitamin B-12 (CYANOCOBALAMIN) 1000 MCG tablet Take 1,000 mcg by mouth daily.   Yes [provider]  acetaminophen (TYLENOL)  325 MG tablet Take 2 tablets (650 mg total) by mouth every 6 (six) hours as needed for mild pain (or Fever >/= 101). Patient not taking: Reported on 07/11/2019 02/08/16   Irwin Brakeman L, MD  glucose blood (ONETOUCH VERIO) test strip 1 each by Other route as needed for other. Use as instructed to check blood pressure 2 times per day dx: e11.65 10/25/18   Glendale Chard, MD    Family History Family History  Problem Relation Age of Onset  . Alzheimer's disease Mother   . Prostate cancer Father   . Brain cancer Brother   . Brain cancer Brother   . Pancreatic cancer Sister   . Allergic rhinitis Neg Hx   . Asthma Neg Hx   . Eczema Neg Hx   . Urticaria Neg Hx     Social History Social History   Tobacco Use  . Smoking status: Never Smoker  . Smokeless tobacco: Never Used  Substance Use Topics  . Alcohol use: No  . Drug use: No     Allergies   Amoxicillin, Ampicillin, and Sulfa antibiotics   Review of Systems Review of Systems  Constitutional: Positive for  chills and fever.  Respiratory: Negative for cough and shortness of breath.   Cardiovascular: Negative for chest pain.  Gastrointestinal: Positive for abdominal pain, diarrhea, nausea and vomiting.  All other systems reviewed and are negative.    Physical Exam Updated Vital Signs BP (!) 174/105   Pulse 100   Temp 99.6 F (37.6 C) (Oral)   Resp 16   Ht 5' 5.5" (1.664 m)   Wt 59 kg   LMP  (LMP Unknown)   SpO2 96%   BMI 21.30 kg/m   Physical Exam Vitals signs and nursing note reviewed.  Constitutional:      General: She is not in acute distress.    Appearance: She is well-developed.  HENT:     Head: Normocephalic and atraumatic.  Eyes:     General:        Right eye: No discharge.        Left eye: No discharge.     Conjunctiva/sclera: Conjunctivae normal.  Neck:     Vascular: No JVD.     Trachea: No tracheal deviation.  Cardiovascular:     Rate and Rhythm: Regular rhythm. Tachycardia present.  Pulmonary:     Effort: Pulmonary effort is normal.     Breath sounds: Normal breath sounds.  Abdominal:     General: Abdomen is flat. There is no distension.     Palpations: Abdomen is soft.     Tenderness: There is abdominal tenderness in the right lower quadrant, suprapubic area and left lower quadrant. There is no right CVA tenderness, left CVA tenderness, guarding or rebound.  Skin:    General: Skin is warm and dry.     Findings: No erythema.  Neurological:     Mental Status: She is alert.  Psychiatric:        Behavior: Behavior normal.      ED Treatments / Results  Labs (all labs ordered are listed, but only abnormal results are displayed) Labs Reviewed  COMPREHENSIVE METABOLIC PANEL - Abnormal; Notable for the following components:      Result Value   CO2 21 (*)    Glucose, Bld 101 (*)    GFR calc non Af Amer 54 (*)    All other components within normal limits  CBC - Abnormal; Notable for the following components:   WBC  14.3 (*)    All other components  within normal limits  URINALYSIS, ROUTINE W REFLEX MICROSCOPIC - Abnormal; Notable for the following components:   Protein, ur 30 (*)    Bacteria, UA RARE (*)    All other components within normal limits  SARS CORONAVIRUS 2 (TAT 6-24 HRS)  LIPASE, BLOOD  LACTIC ACID, PLASMA  LACTIC ACID, PLASMA  CBG MONITORING, ED    EKG None  Radiology Ct Abdomen Pelvis W Contrast  Result Date: 07/11/2019 CLINICAL DATA:  Diarrhea, vomiting, slight fever, generalized abdominal pain EXAM: CT ABDOMEN AND PELVIS WITH CONTRAST TECHNIQUE: Multidetector CT imaging of the abdomen and pelvis was performed using the standard protocol following bolus administration of intravenous contrast. Sagittal and coronal MPR images reconstructed from axial data set. CONTRAST:  147mL OMNIPAQUE IOHEXOL 300 MG/ML SOLN IV. No oral contrast. COMPARISON:  01/14/2019 FINDINGS: Lower chest: Calcified granuloma LEFT lower lobe image 22. Medial LEFT basilar scarring stable. Hepatobiliary: Gallbladder and liver normal appearance Pancreas: Atrophic without mass Spleen: Normal appearance. Tiny splenule. Adrenals/Urinary Tract: Adrenal glands normal appearance. Small BILATERAL renal cysts. 2.8 x 1.8 cm diameter mass at upper pole LEFT kidney with foci of macroscopic fat consistent with angiomyolipoma. Tiny nonobstructing LEFT renal calculus. No additional renal masses, hydronephrosis, or hydroureter. Bladder unremarkable. Stomach/Bowel: Moderate-sized hiatal hernia. Stomach otherwise normal appearance. Normal appendix. Small bowel loops normal appearance. Sigmoid diverticulosis. Wall thickening of sigmoid colon, to lesser degree distal transverse and proximal descending colon, raising question of colitis. No definite pericolic inflammatory changes at sigmoid colon to suggest diverticulitis. Proximal colon unremarkable. Vascular/Lymphatic: Atherosclerotic calcifications aorta, coronary arteries, iliac arteries. Aorta normal caliber. Significant  plaque at the origins of the celiac and superior mesenteric arteries. No adenopathy. Reproductive: Uterus surgically absent. Probable atrophic ovaries bilaterally. Other: No free air or free fluid. Small LEFT inguinal hernia containing fat. Musculoskeletal: Osseous demineralization with degenerative disc and facet disease changes of lumbar spine. Grade 2 anterolisthesis L5-S1 secondary to severe degenerative changes. IMPRESSION: Sigmoid diverticulosis. Wall thickening of the sigmoid colon, to lesser degree distal transverse and proximal descending colon, raising question of colitis ; differential diagnosis includes infection and inflammatory bowel disease, though patient does have significant atherosclerotic disease changes including at the origins of the celiac and superior mesenteric arteries and ischemic colitis is not excluded. Moderate-sized hiatal hernia. Small LEFT inguinal hernia containing fat. 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 Tiny nonobstructing LEFT renal calculus. Aortic Atherosclerosis (ICD10-I70.0). Electronically Signed   By: Lavonia Dana M.D.   On: 07/11/2019 17:23    Procedures Procedures (including critical care time)  Medications Ordered in ED Medications  ondansetron (ZOFRAN-ODT) disintegrating tablet 4 mg (has no administration in time range)  ciprofloxacin (CIPRO) IVPB 400 mg (400 mg Intravenous New Bag/Given 07/11/19 1818)  metroNIDAZOLE (FLAGYL) IVPB 500 mg (has no administration in time range)  0.9 %  sodium chloride infusion (500 mLs Intravenous New Bag/Given 07/11/19 1816)  0.9 %  sodium chloride infusion (has no administration in time range)  sodium chloride 0.9 % bolus 500 mL (0 mLs Intravenous Stopped 07/11/19 1634)  morphine 2 MG/ML injection 2 mg (2 mg Intravenous Given 07/11/19 1527)  ondansetron (ZOFRAN) injection 4 mg (4 mg Intravenous Given 07/11/19 1526)  sodium chloride (PF) 0.9 % injection (  Given by Other 07/11/19 1806)  iohexol (OMNIPAQUE) 300 MG/ML  solution 100 mL (100 mLs Intravenous Contrast Given 07/11/19 1657)     Initial Impression / Assessment and Plan / ED Course  I have reviewed  the triage vital signs and the nursing notes.  Pertinent labs & imaging results that were available during my care of the patient were reviewed by me and considered in my medical decision making (see chart for details).        Patient presenting for evaluation of lower abdominal pain with associated low-grade fever, nausea, vomiting, diarrhea since yesterday.  Has not been able to tolerate p.o. since her symptoms began.  Temperature 99.6 F, tachycardic on initial arrival and persistently hypertensive in the ED.  Will give IV fluids, antiemetics, and pain medicine in the ED.   Lab work reviewed by me shows leukocytosis, no renal insufficiency, no metabolic derangements.  UA does not suggest UTI or nephrolithiasis.  Her CT scan shows evidence of wall thickening of the sigmoid colon, no evidence of obstruction or perforation.  However, the question of ischemic colitis is raised given significant atherosclerotic disease changes including the origins of the celiac and superior mesenteric arteries noted on imaging today.   Spoke with Dr. Marthenia Rolling with [facility] hospitalist service who recommends obtaining lactic acid level, giving more IV fluids, and consulting GI.   CONSULT:   Spoke with Dr. Lyndel Safe with [facility] who agrees with admission to the hospitalist service will follow the patient while in the hospital.  Lactic acid and Covid tests are pending.  On reevaluation patient is resting comfortably, reports pain has improved.  Will p.o. challenge  Final Clinical Impressions(s) / ED Diagnoses   Final diagnoses:  Colitis  Lower abdominal pain  Nausea, vomiting and diarrhea    ED Discharge Orders    None       Debroah Baller 07/11/19 1920    Lacretia Leigh, MD 07/12/19 [phone]

## 2019-07-11 NOTE — ED Notes (Signed)
Patient transported to CT 

## 2019-07-11 NOTE — Progress Notes (Addendum)
Patient arrived to unit. Patient states her son is aware of her admission. Norlene Duel RN, BSN

## 2019-07-11 NOTE — H&P (Signed)
History and Physical  Anna Hoffman AST:[id number] DOB: [birth year] DOA: 07/11/2019  Referring physician: ER provider PCP: Glendale Chard, MD  Outpatient Specialists:    Patient coming from: Home  Chief Complaint: Nausea, vomiting and diarrhea since last night  HPI:  Patient is an 84 year old female with past medical history significant for diabetes mellitus, stroke, shingles, hypokalemia and hypertension.  Patient presents with nausea, vomiting and diarrhea that started last night.  Diarrhea has resolved, but patient has continued to have nausea and vomiting.  No associated fever or chills.  No headache, no neck pain, no chest pain, no shortness of breath, no URI symptoms and no urinary symptoms.  CT scan of the abdomen and pelvis done revealed thickening of the wall of sigmoid colon, distal transverse and proximal descending colon worrisome for colitis.  Lactic acid was 1.2.  Patient be admitted for further assessment and management.  ED Course: On presentation to the ER, temperature was 97.9, blood pressure of 156/97, heart rate of 108, respiratory rate of 18 and O2 sat of 97%.  Work-up done revealed possible colitis.  Lactic acid is within normal range.  WBC is 14.3.  Diarrhea has resolved.  Nausea and vomiting persist.  Hospitalist team has been asked to admit patient for further assessment and management.  Pertinent labs: As documented above.  Imaging: independently reviewed.   Review of Systems:  Negative for fever, visual changes, sore throat, rash, new muscle aches, chest pain, SOB, dysuria, bleeding. Past Medical History:  Diagnosis Date  . Arthritis   . Diabetes mellitus without complication ([place])   . Hypertension   . Hypokalemia   . Pneumonia   . Shingles   . Stroke [facility])     Past Surgical History:  Procedure Laterality Date  . ABDOMINAL HYSTERECTOMY    . BREAST EXCISIONAL BIOPSY Left   . ESOPHAGOGASTRODUODENOSCOPY N/A 09/01/2014   Procedure: ESOPHAGOGASTRODUODENOSCOPY  (EGD);  Surgeon: Lear Ng, MD;  Location: [facility];  Service: Endoscopy;  Laterality: N/A;  . HIATAL HERNIA REPAIR N/A 09/04/2014   Procedure: LAPAROSCOPIC REPAIR OF HIATAL HERNIA;  Surgeon: Excell Seltzer, MD;  Location: [facility];  Service: General;  Laterality: N/A;  With MESH  . IR GENERIC HISTORICAL  04/10/2016   IR US GUIDE VASC ACCESS RIGHT 04/10/2016 Corrie Mckusick, DO [facility]  . IR GENERIC HISTORICAL  04/10/2016   IR ANGIOGRAM SELECTIVE EACH ADDITIONAL VESSEL 04/10/2016 Corrie Mckusick, DO [facility]  . IR GENERIC HISTORICAL  04/10/2016   IR ANGIOGRAM SELECTIVE EACH ADDITIONAL VESSEL 04/10/2016 Corrie Mckusick, DO [facility]  . IR GENERIC HISTORICAL  04/10/2016   IR EMBO TUMOR ORGAN ISCHEMIA INFARCT INC GUIDE ROADMAPPING 04/10/2016 Corrie Mckusick, DO [facility]  . IR GENERIC HISTORICAL  04/10/2016   IR ANGIOGRAM SELECTIVE EACH ADDITIONAL VESSEL 04/10/2016 Corrie Mckusick, DO [facility]  . IR GENERIC HISTORICAL  04/10/2016   IR ANGIOGRAM SELECTIVE EACH ADDITIONAL VESSEL 04/10/2016 Corrie Mckusick, DO [facility]  . IR GENERIC HISTORICAL  04/10/2016   IR RENAL SELECTIVE  UNI INC S&I MOD SED 04/10/2016 Corrie Mckusick, DO [facility]  . IR GENERIC HISTORICAL  03/06/2016   IR RADIOLOGIST EVAL & MGMT 03/06/2016 Corrie Mckusick, DO [facility]  . IR GENERIC HISTORICAL  03/26/2016   IR RADIOLOGIST EVAL & MGMT 03/26/2016 Corrie Mckusick, DO [facility]  . IR GENERIC HISTORICAL  04/29/2016   IR RADIOLOGIST EVAL & MGMT 04/29/2016 [facility]  . IR RADIOLOGIST EVAL & MGMT  11/12/2016  . IR  RADIOLOGIST EVAL & MGMT  12/16/2017  . KNEE SURGERY    . TONSILLECTOMY       reports that she has never smoked. She has never used smokeless tobacco. She reports that she does not drink alcohol or use drugs.  Allergies  Allergen Reactions  . Amoxicillin Diarrhea    Has patient had a PCN reaction causing immediate rash, facial/tongue/throat swelling, SOB or lightheadedness with hypotension: no Has patient had  a PCN reaction causing severe rash involving mucus membranes or skin necrosis: no Has patient had a PCN reaction that required hospitalization: no pharmacist consult Has patient had a PCN reaction occurring within the last 10 years: yes If all of the above answers are "NO", then may proceed with Cephalosporin use.   . Ampicillin Rash    Has patient had a PCN reaction causing immediate rash, facial/tongue/throat swelling, SOB or lightheadedness with hypotension: No Has patient had a PCN reaction causing severe rash involving mucus membranes or skin necrosis: No Has patient had a PCN reaction that required hospitalization No Has patient had a PCN reaction occurring within the last 10 years: No If all of the above answers are "NO", then may proceed with Cephalosporin use.   . Sulfa Antibiotics Rash    Family History  Problem Relation Age of Onset  . Alzheimer's disease Mother   . Prostate cancer Father   . Brain cancer Brother   . Brain cancer Brother   . Pancreatic cancer Sister   . Allergic rhinitis Neg Hx   . Asthma Neg Hx   . Eczema Neg Hx   . Urticaria Neg Hx      Prior to Admission medications   Medication Sig Start Date End Date Taking? Authorizing Provider  albuterol (VENTOLIN HFA) 108 (90 Base) MCG/ACT inhaler Inhale 1-2 puffs into the lungs every 4 (four) hours as needed for wheezing or shortness of breath. 06/15/19  Yes Padgett, Rae Halsted, MD  amLODipine (NORVASC) 10 MG tablet TAKE 1 TABLET BY MOUTH ONCE DAILY Patient taking differently: Take 10 mg by mouth daily.  10/29/18  Yes Glendale Chard, MD  Ascorbic Acid (VITAMIN C) 1000 MG tablet Take 1,000 mg by mouth daily.   Yes [provider]  aspirin EC 81 MG tablet Take 81 mg by mouth daily.   Yes [provider]  azelastine (ASTELIN) 0.1 % nasal spray Place 2 sprays into both nostrils 2 (two) times daily. Use in each nostril as directed 10/06/18  Yes Padgett, Rae Halsted, MD   Carboxymethylcellul-Glycerin (LUBRICATING EYE DROPS OP) Apply 1 drop to eye daily as needed (dry eyes).    Yes [provider]  dicyclomine (BENTYL) 10 MG capsule Take 1 capsule (10 mg total) by mouth 2 (two) times daily. 06/06/19  Yes Zehr, Laban Emperor, PA-C  Fexofenadine HCl (ALLEGRA PO) Take 1 capsule by mouth daily.    Yes [provider]  hydrochlorothiazide (MICROZIDE) 12.5 MG capsule Take 1 capsule (12.5 mg total) by mouth daily. Patient taking differently: Take 12.5 mg by mouth daily as needed (fluid).  04/08/19 04/07/20 Yes Glendale Chard, MD  irbesartan (AVAPRO) 150 MG tablet TAKE 1 TABLET BY MOUTH EVERY DAY. 03/17/19  Yes Glendale Chard, MD  loperamide (IMODIUM) 2 MG capsule Take 1 capsule (2 mg total) by mouth 4 (four) times daily as needed for diarrhea or loose stools. 02/13/19  Yes Couture, Cortni S, PA-C  Multiple Vitamins-Minerals (STRESS TAB NF PO) Take 1 tablet by mouth daily.   Yes [provider]  olopatadine (PATANOL) 0.1 % ophthalmic solution Place 1 drop into both eyes 2 (two) times daily. 06/15/19  Yes Padgett, Rae Halsted, MD  omeprazole (PRILOSEC) 20 MG capsule TAKE 1 CAPSULE BY MOUTH ONCE DAILY 30 MINUTES TO 1 HOUR BEFORE A MEAL Patient taking differently: Take 20 mg by mouth daily.  10/29/18  Yes Glendale Chard, MD  rosuvastatin (CRESTOR) 20 MG tablet TAKE 1 TABLET BY MOUTH ONCE DAILY Patient taking differently: Take 20 mg by mouth daily.  10/29/18  Yes Glendale Chard, MD  trolamine salicylate (ASPERCREME) 10 % cream Apply 1 application topically as needed for muscle pain.   Yes [provider]  vitamin B-12 (CYANOCOBALAMIN) 1000 MCG tablet Take 1,000 mcg by mouth daily.   Yes [provider]  acetaminophen (TYLENOL) 325 MG tablet Take 2 tablets (650 mg total) by mouth every 6 (six) hours as needed for mild pain (or Fever >/= 101). Patient not taking: Reported on 07/11/2019 02/08/16   Irwin Brakeman L, MD  glucose blood (ONETOUCH  VERIO) test strip 1 each by Other route as needed for other. Use as instructed to check blood pressure 2 times per day dx: e11.65 10/25/18   Glendale Chard, MD    Physical Exam: Vitals:   07/11/19 1129 07/11/19 1442 07/11/19 1830  BP: (!) 161/94 (!) 167/94 (!) 174/105  Pulse: (!) 108 (!) 109 100  Resp: 20 18 16   Temp: 99.6 F (37.6 C)    TempSrc: Oral    SpO2: 95% 96% 96%  Weight: 59 kg    Height: 5' 5.5" (1.664 m)      Constitutional:  . Appears calm and comfortable Eyes:  . No pallor. No jaundice.  ENMT:  . external ears, nose appear normal Neck:  . Neck is supple. No JVD Respiratory:  . CTA bilaterally, no w/r/r.  . Respiratory effort normal. No retractions or accessory muscle use Cardiovascular:  . S1S2 . No LE extremity edema   Abdomen:  . Abdomen is soft and non tender. Organs are difficult to assess. Neurologic:  . Awake and alert. . Moves all limbs.  Wt Readings from Last 3 Encounters:  07/11/19 59 kg  06/28/19 63 kg  06/06/19 63 kg    I have personally reviewed following labs and imaging studies  Labs on Admission:  CBC: Recent Labs  Lab 07/11/19 1209  WBC 14.3*  HGB 13.8  HCT 44.1  MCV 91.5  PLT 481   Basic Metabolic Panel: Recent Labs  Lab 07/11/19 1209  NA 142  K 3.6  CL 107  CO2 21*  GLUCOSE 101*  BUN 20  CREATININE 0.97  CALCIUM 9.3   Liver Function Tests: Recent Labs  Lab 07/11/19 1209  AST 19  ALT 17  ALKPHOS 82  BILITOT 0.6  PROT 7.7  ALBUMIN 4.4   Recent Labs  Lab 07/11/19 1209  LIPASE 18   No results for input(s): AMMONIA in the last 168 hours. Coagulation Profile: No results for input(s): INR, PROTIME in the last 168 hours. Cardiac Enzymes: No results for input(s): CKTOTAL, CKMB, CKMBINDEX, TROPONINI in the last 168 hours. BNP (last 3 results) No results for input(s): PROBNP in the last 8760 hours. HbA1C: No results for input(s): HGBA1C in the last 72 hours. CBG: Recent Labs  Lab 07/11/19 1338   GLUCAP 80   Lipid Profile: No results for input(s): CHOL, HDL, LDLCALC, TRIG, CHOLHDL, LDLDIRECT in the last 72 hours. Thyroid Function Tests: No results for input(s): TSH, T4TOTAL, FREET4, T3FREE, THYROIDAB in  the last 72 hours. Anemia Panel: No results for input(s): VITAMINB12, FOLATE, FERRITIN, TIBC, IRON, RETICCTPCT in the last 72 hours. Urine analysis:    Component Value Date/Time   COLORURINE YELLOW 07/11/2019 1209   APPEARANCEUR CLEAR 07/11/2019 1209   LABSPEC 1.018 07/11/2019 1209   PHURINE 6.0 07/11/2019 1209   GLUCOSEU NEGATIVE 07/11/2019 1209   HGBUR NEGATIVE 07/11/2019 1209   [place] 07/11/2019 1209   KETONESUR NEGATIVE 07/11/2019 1209   PROTEINUR 30 (A) 07/11/2019 1209   UROBILINOGEN 0.2 09/01/2014 0300   NITRITE NEGATIVE 07/11/2019 1209   LEUKOCYTESUR NEGATIVE 07/11/2019 1209   Sepsis Labs: @LABRCNTIP (procalcitonin:4,lacticidven:4) )No results found for this or any previous visit (from the past 240 hour(s)).    Radiological Exams on Admission: Ct Abdomen Pelvis W Contrast  Result Date: 07/11/2019 CLINICAL DATA:  Diarrhea, vomiting, slight fever, generalized abdominal pain EXAM: CT ABDOMEN AND PELVIS WITH CONTRAST TECHNIQUE: Multidetector CT imaging of the abdomen and pelvis was performed using the standard protocol following bolus administration of intravenous contrast. Sagittal and coronal MPR images reconstructed from axial data set. CONTRAST:  142mL OMNIPAQUE IOHEXOL 300 MG/ML SOLN IV. No oral contrast. COMPARISON:  01/14/2019 FINDINGS: Lower chest: Calcified granuloma LEFT lower lobe image 22. Medial LEFT basilar scarring stable. Hepatobiliary: Gallbladder and liver normal appearance Pancreas: Atrophic without mass Spleen: Normal appearance. Tiny splenule. Adrenals/Urinary Tract: Adrenal glands normal appearance. Small BILATERAL renal cysts. 2.8 x 1.8 cm diameter mass at upper pole LEFT kidney with foci of macroscopic fat consistent with  angiomyolipoma. Tiny nonobstructing LEFT renal calculus. No additional renal masses, hydronephrosis, or hydroureter. Bladder unremarkable. Stomach/Bowel: Moderate-sized hiatal hernia. Stomach otherwise normal appearance. Normal appendix. Small bowel loops normal appearance. Sigmoid diverticulosis. Wall thickening of sigmoid colon, to lesser degree distal transverse and proximal descending colon, raising question of colitis. No definite pericolic inflammatory changes at sigmoid colon to suggest diverticulitis. Proximal colon unremarkable. Vascular/Lymphatic: Atherosclerotic calcifications aorta, coronary arteries, iliac arteries. Aorta normal caliber. Significant plaque at the origins of the celiac and superior mesenteric arteries. No adenopathy. Reproductive: Uterus surgically absent. Probable atrophic ovaries bilaterally. Other: No free air or free fluid. Small LEFT inguinal hernia containing fat. Musculoskeletal: Osseous demineralization with degenerative disc and facet disease changes of lumbar spine. Grade 2 anterolisthesis L5-S1 secondary to severe degenerative changes. IMPRESSION: Sigmoid diverticulosis. Wall thickening of the sigmoid colon, to lesser degree distal transverse and proximal descending colon, raising question of colitis ; differential diagnosis includes infection and inflammatory bowel disease, though patient does have significant atherosclerotic disease changes including at the origins of the celiac and superior mesenteric arteries and ischemic colitis is not excluded. Moderate-sized hiatal hernia. Small LEFT inguinal hernia containing fat. 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 Tiny nonobstructing LEFT renal calculus. Aortic Atherosclerosis (ICD10-I70.0). Electronically Signed   By: Lavonia Dana M.D.   On: 07/11/2019 17:23    Active Problems:   Colitis   Assessment/Plan Colitis, possible infectious colitis: -Place patient on observation. -Continue supportive care. -Continue  IV Cipro and Flagyl. -Continue work-up as outlined. -Further management depend on hospital course.  Hypertension: -Continue to optimize.  Diabetes mellitus: -Diet and exercise.  DVT prophylaxis: Subacute Lovenox Code Status: Full code Family Communication:  Disposition Plan: This will depend on hospital course Consults called: None Admission status: Observation  Time spent: 60 minutes  Dana Allan, MD  [facility] Pager #: [phone] 7PM-7AM contact night coverage as above  07/11/2019, 7:55 PM

## 2019-07-11 NOTE — ED Notes (Signed)
ED TO INPATIENT HANDOFF REPORT  Name/Age/Gender Anna Hoffman 84 y.o. female  Code Status    Code Status Orders  (From admission, onward)         Start     Ordered   07/11/19 1959  Full code  Continuous     07/11/19 1958        Code Status History    Date Active Date Inactive Code Status Order ID Comments User Context   11/07/2017 2254 11/10/2017 1750 Full Code [id number]  Etta Quill, DO ED   07/01/2016 1734 07/12/2016 1302 Full Code [id number]  Cathlyn Parsons, PA-C Inpatient   07/01/2016 1734 07/01/2016 1734 Full Code [id number]  Cathlyn Parsons, PA-C Inpatient   06/27/2016 2000 07/01/2016 1724 Full Code [id number]  Greta Doom, MD Inpatient   02/04/2016 1800 02/08/2016 1449 Full Code [id number]  Jani Gravel, MD Inpatient   09/01/2014 0612 09/06/2014 1408 Full Code [id number]  Alphonsa Overall, MD ED   Advance Care Planning Activity      Home/SNF/Other Home  Chief Complaint Fever, Chills, Vomiting, Abd Pain  Level of Care/Admitting Diagnosis ED Disposition    ED Disposition Condition [facility]: [place] [100102]  Level of Care: Med-Surg [16]  Covid Evaluation: Asymptomatic Screening Protocol (No Symptoms)  Diagnosis: Colitis [119417]  Admitting Physician: Bonnell Public [[id number]]  Attending Physician: Dana Allan I [[id number]]  PT Class (Do Not Modify): Observation [104]  PT Acc Code (Do Not Modify): Observation [10022]       Medical History Past Medical History:  Diagnosis Date  . Arthritis   . Diabetes mellitus without complication ([place])   . Hypertension   . Hypokalemia   . Pneumonia   . Shingles   . Stroke [facility])     Allergies Allergies  Allergen Reactions  . Amoxicillin Diarrhea    Has patient had a PCN reaction causing immediate rash, facial/tongue/throat swelling, SOB or lightheadedness with hypotension: no Has patient had a PCN reaction causing severe rash involving mucus membranes or skin  necrosis: no Has patient had a PCN reaction that required hospitalization: no pharmacist consult Has patient had a PCN reaction occurring within the last 10 years: yes If all of the above answers are "NO", then may proceed with Cephalosporin use.   . Ampicillin Rash    Has patient had a PCN reaction causing immediate rash, facial/tongue/throat swelling, SOB or lightheadedness with hypotension: No Has patient had a PCN reaction causing severe rash involving mucus membranes or skin necrosis: No Has patient had a PCN reaction that required hospitalization No Has patient had a PCN reaction occurring within the last 10 years: No If all of the above answers are "NO", then may proceed with Cephalosporin use.   . Sulfa Antibiotics Rash    IV Location/Drains/Wounds Patient Lines/Drains/Airways Status   Active Line/Drains/Airways    Name:   Placement date:   Placement time:   Site:   Days:   Peripheral IV 07/11/19 Right Antecubital   07/11/19    1525    Antecubital   less than 1   Incision (Closed) 09/04/14 Abdomen   09/04/14    1503     1771   Incision - 6 Ports Abdomen Right;Lateral Right;Medial Left;Medial Left;Medial;Lateral Left;Lateral Mid;Upper   09/04/14    1455     1771          Labs/Imaging Results for orders placed or performed during the hospital encounter of 07/11/19 (from  the past 48 hour(s))  Lipase, blood     Status: None   Collection Time: 07/11/19 12:09 PM  Result Value Ref Range   Lipase 18 11 - 51 U/L    Comment: Performed at [facility], [place] [address] [place], [place] [address]  Comprehensive metabolic panel     Status: Abnormal   Collection Time: 07/11/19 12:09 PM  Result Value Ref Range   Sodium 142 135 - 145 mmol/L   Potassium 3.6 3.5 - 5.1 mmol/L   Chloride 107 98 - 111 mmol/L   CO2 21 (L) 22 - 32 mmol/L   Glucose, Bld 101 (H) 70 - 99 mg/dL   BUN 20 8 - 23 mg/dL   Creatinine, Ser 0.97 0.44 - 1.00 mg/dL   Calcium 9.3 8.9 - 10.3 mg/dL    Total Protein 7.7 6.5 - 8.1 g/dL   Albumin 4.4 3.5 - 5.0 g/dL   AST 19 15 - 41 U/L   ALT 17 0 - 44 U/L   Alkaline Phosphatase 82 38 - 126 U/L   Total Bilirubin 0.6 0.3 - 1.2 mg/dL   GFR calc non Af Amer 54 (L) >60 mL/min   GFR calc Af Amer >60 >60 mL/min   Anion gap 14 5 - 15    Comment: Performed at [facility], [place] [address] [place], [place] [address]  CBC     Status: Abnormal   Collection Time: 07/11/19 12:09 PM  Result Value Ref Range   WBC 14.3 (H) 4.0 - 10.5 K/uL   RBC 4.82 3.87 - 5.11 MIL/uL   Hemoglobin 13.8 12.0 - 15.0 g/dL   HCT 44.1 36.0 - 46.0 %   MCV 91.5 80.0 - 100.0 fL   MCH 28.6 26.0 - 34.0 pg   MCHC 31.3 30.0 - 36.0 g/dL   RDW 14.9 11.5 - 15.5 %   Platelets 370 150 - 400 K/uL   nRBC 0.0 0.0 - 0.2 %    Comment: Performed at [facility], [place] [address], [place], [place] [address]  Urinalysis, Routine w reflex microscopic     Status: Abnormal   Collection Time: 07/11/19 12:09 PM  Result Value Ref Range   Color, Urine YELLOW YELLOW   APPearance CLEAR CLEAR   Specific Gravity, Urine 1.018 1.005 - 1.030   pH 6.0 5.0 - 8.0   Glucose, UA NEGATIVE NEGATIVE mg/dL   Hgb urine dipstick NEGATIVE NEGATIVE   Bilirubin Urine NEGATIVE NEGATIVE   Ketones, ur NEGATIVE NEGATIVE mg/dL   Protein, ur 30 (A) NEGATIVE mg/dL   Nitrite NEGATIVE NEGATIVE   Leukocytes,Ua NEGATIVE NEGATIVE   RBC / HPF 0-5 0 - 5 RBC/hpf   WBC, UA 0-5 0 - 5 WBC/hpf   Bacteria, UA RARE (A) NONE SEEN   Squamous Epithelial / LPF 0-5 0 - 5    Comment: Performed at [facility], [place] [address] [place], [place] [address]  Magnesium     Status: None   Collection Time: 07/11/19 12:09 PM  Result Value Ref Range   Magnesium 2.3 1.7 - 2.4 mg/dL    Comment: Performed at [facility], [place] [address] [place], [place] [address]  Phosphorus     Status: None   Collection Time: 07/11/19 12:09 PM  Result Value Ref Range   Phosphorus  3.5 2.5 - 4.6 mg/dL    Comment: Performed at [facility], [place] [address] [place],  [address]  CBG monitoring, ED  Status: None   Collection Time: 07/11/19  1:38 PM  Result Value Ref Range   Glucose-Capillary 80 70 - 99 mg/dL  Lactic acid, plasma     Status: None   Collection Time: 07/11/19  7:05 PM  Result Value Ref Range   Lactic Acid, Venous 1.2 0.5 - 1.9 mmol/L    Comment: Performed at [facility], [place] [address], [place], [place] [address]  CBG monitoring, ED     Status: Abnormal   Collection Time: 07/11/19 10:02 PM  Result Value Ref Range   Glucose-Capillary 106 (H) 70 - 99 mg/dL   Ct Abdomen Pelvis W Contrast  Result Date: 07/11/2019 CLINICAL DATA:  Diarrhea, vomiting, slight fever, generalized abdominal pain EXAM: CT ABDOMEN AND PELVIS WITH CONTRAST TECHNIQUE: Multidetector CT imaging of the abdomen and pelvis was performed using the standard protocol following bolus administration of intravenous contrast. Sagittal and coronal MPR images reconstructed from axial data set. CONTRAST:  113mL OMNIPAQUE IOHEXOL 300 MG/ML SOLN IV. No oral contrast. COMPARISON:  01/14/2019 FINDINGS: Lower chest: Calcified granuloma LEFT lower lobe image 22. Medial LEFT basilar scarring stable. Hepatobiliary: Gallbladder and liver normal appearance Pancreas: Atrophic without mass Spleen: Normal appearance. Tiny splenule. Adrenals/Urinary Tract: Adrenal glands normal appearance. Small BILATERAL renal cysts. 2.8 x 1.8 cm diameter mass at upper pole LEFT kidney with foci of macroscopic fat consistent with angiomyolipoma. Tiny nonobstructing LEFT renal calculus. No additional renal masses, hydronephrosis, or hydroureter. Bladder unremarkable. Stomach/Bowel: Moderate-sized hiatal hernia. Stomach otherwise normal appearance. Normal appendix. Small bowel loops normal appearance. Sigmoid diverticulosis. Wall thickening of sigmoid colon, to lesser degree distal transverse  and proximal descending colon, raising question of colitis. No definite pericolic inflammatory changes at sigmoid colon to suggest diverticulitis. Proximal colon unremarkable. Vascular/Lymphatic: Atherosclerotic calcifications aorta, coronary arteries, iliac arteries. Aorta normal caliber. Significant plaque at the origins of the celiac and superior mesenteric arteries. No adenopathy. Reproductive: Uterus surgically absent. Probable atrophic ovaries bilaterally. Other: No free air or free fluid. Small LEFT inguinal hernia containing fat. Musculoskeletal: Osseous demineralization with degenerative disc and facet disease changes of lumbar spine. Grade 2 anterolisthesis L5-S1 secondary to severe degenerative changes. IMPRESSION: Sigmoid diverticulosis. Wall thickening of the sigmoid colon, to lesser degree distal transverse and proximal descending colon, raising question of colitis ; differential diagnosis includes infection and inflammatory bowel disease, though patient does have significant atherosclerotic disease changes including at the origins of the celiac and superior mesenteric arteries and ischemic colitis is not excluded. Moderate-sized hiatal hernia. Small LEFT inguinal hernia containing fat. 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 Tiny nonobstructing LEFT renal calculus. Aortic Atherosclerosis (ICD10-I70.0). Electronically Signed   By: Lavonia Dana M.D.   On: 07/11/2019 17:23    Pending Labs Unresulted Labs (From admission, onward)    Start     Ordered   07/18/19 0500  Creatinine, serum  (enoxaparin (LOVENOX)    CrCl >/= 30 ml/min)  Weekly,   R    Comments: while on enoxaparin therapy    07/11/19 1958   07/12/19 7741  Basic metabolic panel  Tomorrow morning,   R     07/11/19 1958   07/12/19 0500  CBC  Tomorrow morning,   R     07/11/19 1958   07/11/19 1959  Hemoglobin A1c  Once,   STAT    Comments: To assess prior glycemic control    07/11/19 1958   07/11/19 1959  Urinalysis,  Routine w reflex microscopic  Once,   STAT  07/11/19 1958   07/11/19 1824  Lactic acid, plasma  Now then every 2 hours,   STAT     07/11/19 1823   07/11/19 1814  SARS CORONAVIRUS 2 (TAT 6-24 HRS) Nasopharyngeal Nasopharyngeal Swab  (Asymptomatic/Tier 3)  Once,   STAT    Question Answer Comment  Is this test for diagnosis or screening Screening   Symptomatic for COVID-19 as defined by CDC No   Hospitalized for COVID-19 No   Admitted to ICU for COVID-19 No   Previously tested for COVID-19 No   Resident in a congregate (group) care setting No   Employed in healthcare setting No   Pregnant No      07/11/19 1813          Vitals/Pain Today's Vitals   07/11/19 1633 07/11/19 1830 07/11/19 1934 07/11/19 2218  BP:  (!) 174/105  (!) 175/104  Pulse:  100  (!) 116  Resp:  16  17  Temp:    97.9 F (36.6 C)  TempSrc:    Axillary  SpO2:  96%  96%  Weight:      Height:      PainSc: 0-No pain  3      Isolation Precautions No active isolations  Medications Medications  0.9 %  sodium chloride infusion (500 mLs Intravenous New Bag/Given 07/11/19 1816)  aspirin EC tablet 81 mg (81 mg Oral Given 07/11/19 2206)  amLODipine (NORVASC) tablet 10 mg (10 mg Oral Given 07/11/19 2206)  irbesartan (AVAPRO) tablet 150 mg (150 mg Oral Given 07/11/19 2205)  rosuvastatin (CRESTOR) tablet 20 mg (20 mg Oral Given 07/11/19 2204)  dicyclomine (BENTYL) capsule 10 mg (10 mg Oral Given 07/11/19 2203)  pantoprazole (PROTONIX) EC tablet 40 mg (40 mg Oral Given 07/11/19 2203)  vitamin B-12 (CYANOCOBALAMIN) tablet 1,000 mcg (has no administration in time range)  vitamin C (ASCORBIC ACID) tablet 1,000 mg (1,000 mg Oral Given 07/11/19 2201)  albuterol (VENTOLIN HFA) 108 (90 Base) MCG/ACT inhaler 1-2 puff (has no administration in time range)  azelastine (ASTELIN) 0.1 % nasal spray 2 spray (2 sprays Each Nare Given 07/11/19 2208)  loratadine (CLARITIN) tablet 10 mg (10 mg Oral Given 07/11/19 2204)  olopatadine (PATANOL)  0.1 % ophthalmic solution 1 drop (1 drop Both Eyes Given 07/11/19 2207)  enoxaparin (LOVENOX) injection 40 mg (40 mg Subcutaneous Given 07/11/19 2205)  insulin aspart (novoLOG) injection 0-6 Units (has no administration in time range)  ciprofloxacin (CIPRO) IVPB 400 mg (has no administration in time range)  metroNIDAZOLE (FLAGYL) IVPB 500 mg (has no administration in time range)  hydrALAZINE (APRESOLINE) tablet 25 mg (has no administration in time range)  0.9 % NaCl with KCl 20 mEq/ L  infusion ( Intravenous New Bag/Given 07/11/19 2213)  polyvinyl alcohol (LIQUIFILM TEARS) 1.4 % ophthalmic solution 1 drop (has no administration in time range)  ondansetron (ZOFRAN) injection 4 mg (has no administration in time range)  sodium chloride 0.9 % bolus 500 mL (0 mLs Intravenous Stopped 07/11/19 1634)  morphine 2 MG/ML injection 2 mg (2 mg Intravenous Given 07/11/19 1527)  ondansetron (ZOFRAN) injection 4 mg (4 mg Intravenous Given 07/11/19 1526)  sodium chloride (PF) 0.9 % injection (  Given by Other 07/11/19 1806)  iohexol (OMNIPAQUE) 300 MG/ML solution 100 mL (100 mLs Intravenous Contrast Given 07/11/19 1657)  ciprofloxacin (CIPRO) IVPB 400 mg (0 mg Intravenous Stopped 07/11/19 1933)  metroNIDAZOLE (FLAGYL) IVPB 500 mg (0 mg Intravenous Stopped 07/11/19 2034)  0.9 %  sodium chloride infusion ( Intravenous  Stopped 07/11/19 2217)    Mobility walks with person assist

## 2019-07-11 NOTE — ED Triage Notes (Signed)
Patient reports diarrhea and emesis. Patient denies SOB or chest pain. Patient reports she had a slight fever at home.  Patient denies COVID exposure at this time.

## 2019-07-11 NOTE — ED Notes (Signed)
Pharmacy contacted to verify mediations

## 2019-07-11 NOTE — ED Notes (Signed)
Pt ambulatory to bathroom with use of cane, standby assistance.

## 2019-07-12 ENCOUNTER — Encounter (HOSPITAL_COMMUNITY): Payer: Self-pay

## 2019-07-12 DIAGNOSIS — R935 Abnormal findings on diagnostic imaging of other abdominal regions, including retroperitoneum: Secondary | ICD-10-CM | POA: Diagnosis not present

## 2019-07-12 DIAGNOSIS — E785 Hyperlipidemia, unspecified: Secondary | ICD-10-CM | POA: Diagnosis present

## 2019-07-12 DIAGNOSIS — Z7982 Long term (current) use of aspirin: Secondary | ICD-10-CM | POA: Diagnosis not present

## 2019-07-12 DIAGNOSIS — K529 Noninfective gastroenteritis and colitis, unspecified: Secondary | ICD-10-CM | POA: Diagnosis present

## 2019-07-12 DIAGNOSIS — R1013 Epigastric pain: Secondary | ICD-10-CM

## 2019-07-12 DIAGNOSIS — Z800 Family history of malignant neoplasm of digestive organs: Secondary | ICD-10-CM | POA: Diagnosis not present

## 2019-07-12 DIAGNOSIS — I129 Hypertensive chronic kidney disease with stage 1 through stage 4 chronic kidney disease, or unspecified chronic kidney disease: Secondary | ICD-10-CM | POA: Diagnosis present

## 2019-07-12 DIAGNOSIS — A09 Infectious gastroenteritis and colitis, unspecified: Secondary | ICD-10-CM | POA: Diagnosis present

## 2019-07-12 DIAGNOSIS — Z20828 Contact with and (suspected) exposure to other viral communicable diseases: Secondary | ICD-10-CM | POA: Diagnosis present

## 2019-07-12 DIAGNOSIS — E1122 Type 2 diabetes mellitus with diabetic chronic kidney disease: Secondary | ICD-10-CM | POA: Diagnosis present

## 2019-07-12 DIAGNOSIS — N183 Chronic kidney disease, stage 3 unspecified: Secondary | ICD-10-CM | POA: Diagnosis present

## 2019-07-12 DIAGNOSIS — R197 Diarrhea, unspecified: Secondary | ICD-10-CM

## 2019-07-12 DIAGNOSIS — Z820 Family history of epilepsy and other diseases of the nervous system: Secondary | ICD-10-CM | POA: Diagnosis not present

## 2019-07-12 DIAGNOSIS — Z8673 Personal history of transient ischemic attack (TIA), and cerebral infarction without residual deficits: Secondary | ICD-10-CM | POA: Diagnosis not present

## 2019-07-12 DIAGNOSIS — Z808 Family history of malignant neoplasm of other organs or systems: Secondary | ICD-10-CM | POA: Diagnosis not present

## 2019-07-12 DIAGNOSIS — Z8042 Family history of malignant neoplasm of prostate: Secondary | ICD-10-CM | POA: Diagnosis not present

## 2019-07-12 LAB — BASIC METABOLIC PANEL
Anion gap: 9 (ref 5–15)
BUN: 13 mg/dL (ref 8–23)
CO2: 20 mmol/L — ABNORMAL LOW (ref 22–32)
Calcium: 8.8 mg/dL — ABNORMAL LOW (ref 8.9–10.3)
Chloride: 110 mmol/L (ref 98–111)
Creatinine, Ser: 0.89 mg/dL (ref 0.44–1.00)
GFR calc Af Amer: >60 mL/min (ref >60)
GFR calc non Af Amer: >60 mL/min (ref >60)
Glucose, Bld: 98 mg/dL (ref 70–99)
Potassium: 3.8 mmol/L (ref 3.5–5.1)
Sodium: 139 mmol/L (ref 135–145)

## 2019-07-12 LAB — CBC
HCT: 38.3 % (ref 36.0–46.0)
Hemoglobin: 12.4 g/dL (ref 12.0–15.0)
MCH: 29.4 pg (ref 26.0–34.0)
MCHC: 32.4 g/dL (ref 30.0–36.0)
MCV: 90.8 fL (ref 80.0–100.0)
Platelets: 255 10*3/uL (ref 150–400)
RBC: 4.22 MIL/uL (ref 3.87–5.11)
RDW: 14.8 % (ref 11.5–15.5)
WBC: 10.7 10*3/uL — ABNORMAL HIGH (ref 4.0–10.5)
nRBC: 0 % (ref 0.0–0.2)

## 2019-07-12 LAB — SARS CORONAVIRUS 2 (TAT 6-24 HRS): SARS Coronavirus 2: NEGATIVE

## 2019-07-12 LAB — HEMOGLOBIN A1C
Hgb A1c MFr Bld: 6.7 % — ABNORMAL HIGH (ref 4.8–5.6)
Mean Plasma Glucose: 145.59 mg/dL

## 2019-07-12 MED ORDER — SODIUM CHLORIDE 0.9 % IV SOLN
Freq: Once | INTRAVENOUS | Status: AC
  Administered 2019-07-12: 10:00:00 via INTRAVENOUS

## 2019-07-12 MED ORDER — SODIUM CHLORIDE 0.9 % IV SOLN: INTRAVENOUS | Status: DC

## 2019-07-12 NOTE — Consult Note (Signed)
Referring Provider:  [facility]         Primary Care Physician:  Glendale Chard, MD Primary Gastroenterologist:             Reason for Consultation:                   ASSESSMENT /  PLAN    84 year old female with history diabetes, stroke, shingles and hypertension.  Intermittent diarrhea since June, in process of outpatient evaluation. Now admitted with nausea, vomiting, generalized abdominal pain and acute worsening of chronic intermittent diarrhea. Her white count is elevated. She could have infectious colitis ( non-C-diff). Ischemic colitis seems less likely at this point. IBD not excluded.  -Continue cipro / flagyl. If still doing okay then consider discharge tomorrow. -Follow up with Alonza Bogus on 07/26/19 at 1:30 pm . May need colonoscopy in 4-6 weeks.  HPI:     Anna Hoffman is a 84 y.o. female with pmh as below. She was seen in our office as a new patient in early November.  We evaluated her for an episode of diarrhea and abdominal pain, intermittent since around June .  She correlated symptoms with consumption of sweets, possibly her "nerves".  CTAP with contrast in July showed no acute findings.  Labs were unremarkable.  We questioned postinfectious IBS vrs IBS-D, started dicyclomine and gave her a follow-up appointment for 3 to 4 weeks.  She reported having had a colonoscopy with Dr. Earlean Shawl a few years back.  Received records from Dr. Earlean Shawl and her last colonoscopy was actually back in April 2010.  At that time a 1 cm pedunculated polyp was removed from the sigmoid colon.  Polyp pathology compatible with benign polypoid colonic mucosa with underlying benign vascular proliferation.  Patient had been seen by [facility] following colonoscopy Dr. Earlean Shawl but we were unsure if she had had repeat colonoscopy.  Patient admitted to the ED yesterday for nausea, vomiting,  Diarrhea and generalized abdominal pain which started on Sunday.  Stool non-bloody. Patient had eaten a small  amount of food on Sunday, went to bed not feeling well but then woke up around 8 PM with severe diarrhea associated with nausea and vomiting.  She says her fever was around 100.  She was having diarrheal stools about every 15 minutes.  In the ED CT scan of the abdomen and pelvis showed thickening of the wall of the sigmoid colon, distal transverse and proximal descending colon worrisome for colitis.  Started on Cipro Flagyl, initial white count 14.3, down to 10.7 now.  Temp 99.6 yesterday morning, normal since.  Patient has not had any diarrhea since admission.  Stool for C. difficile negative   Past Medical History:  Diagnosis Date  . Arthritis   . Diabetes mellitus without complication ([place])   . Hypertension   . Hypokalemia   . Pneumonia   . Shingles   . Stroke [facility])     Past Surgical History:  Procedure Laterality Date  . ABDOMINAL HYSTERECTOMY    . BREAST EXCISIONAL BIOPSY Left   . ESOPHAGOGASTRODUODENOSCOPY N/A 09/01/2014   Procedure: ESOPHAGOGASTRODUODENOSCOPY (EGD);  Surgeon: Lear Ng, MD;  Location: [facility];  Service: Endoscopy;  Laterality: N/A;  . HIATAL HERNIA REPAIR N/A 09/04/2014   Procedure: LAPAROSCOPIC REPAIR OF HIATAL HERNIA;  Surgeon: Excell Seltzer, MD;  Location: [facility];  Service: General;  Laterality: N/A;  With MESH  . IR GENERIC HISTORICAL  04/10/2016   IR US GUIDE VASC ACCESS RIGHT 04/10/2016  Corrie Mckusick, DO [facility]  . IR GENERIC HISTORICAL  04/10/2016   IR ANGIOGRAM SELECTIVE EACH ADDITIONAL VESSEL 04/10/2016 Corrie Mckusick, DO [facility]  . IR GENERIC HISTORICAL  04/10/2016   IR ANGIOGRAM SELECTIVE EACH ADDITIONAL VESSEL 04/10/2016 Corrie Mckusick, DO [facility]  . IR GENERIC HISTORICAL  04/10/2016   IR EMBO TUMOR ORGAN ISCHEMIA INFARCT INC GUIDE ROADMAPPING 04/10/2016 Corrie Mckusick, DO [facility]  . IR GENERIC HISTORICAL  04/10/2016   IR ANGIOGRAM SELECTIVE EACH ADDITIONAL VESSEL 04/10/2016 Corrie Mckusick, DO [facility]  . IR GENERIC HISTORICAL   04/10/2016   IR ANGIOGRAM SELECTIVE EACH ADDITIONAL VESSEL 04/10/2016 Corrie Mckusick, DO [facility]  . IR GENERIC HISTORICAL  04/10/2016   IR RENAL SELECTIVE  UNI INC S&I MOD SED 04/10/2016 Corrie Mckusick, DO [facility]  . IR GENERIC HISTORICAL  03/06/2016   IR RADIOLOGIST EVAL & MGMT 03/06/2016 Corrie Mckusick, DO [facility]  . IR GENERIC HISTORICAL  03/26/2016   IR RADIOLOGIST EVAL & MGMT 03/26/2016 Corrie Mckusick, DO [facility]  . IR GENERIC HISTORICAL  04/29/2016   IR RADIOLOGIST EVAL & MGMT 04/29/2016 [facility]  . IR RADIOLOGIST EVAL & MGMT  11/12/2016  . IR RADIOLOGIST EVAL & MGMT  12/16/2017  . KNEE SURGERY    . TONSILLECTOMY      Prior to Admission medications   Medication Sig Start Date End Date Taking? Authorizing Provider  albuterol (VENTOLIN HFA) 108 (90 Base) MCG/ACT inhaler Inhale 1-2 puffs into the lungs every 4 (four) hours as needed for wheezing or shortness of breath. 06/15/19  Yes Padgett, Rae Halsted, MD  amLODipine (NORVASC) 10 MG tablet TAKE 1 TABLET BY MOUTH ONCE DAILY Patient taking differently: Take 10 mg by mouth daily.  10/29/18  Yes Glendale Chard, MD  Ascorbic Acid (VITAMIN C) 1000 MG tablet Take 1,000 mg by mouth daily.   Yes [provider]  aspirin EC 81 MG tablet Take 81 mg by mouth daily.   Yes [provider]  azelastine (ASTELIN) 0.1 % nasal spray Place 2 sprays into both nostrils 2 (two) times daily. Use in each nostril as directed 10/06/18  Yes Padgett, Rae Halsted, MD  Carboxymethylcellul-Glycerin (LUBRICATING EYE DROPS OP) Apply 1 drop to eye daily as needed (dry eyes).    Yes [provider]  dicyclomine (BENTYL) 10 MG capsule Take 1 capsule (10 mg total) by mouth 2 (two) times daily. 06/06/19  Yes Zehr, Laban Emperor, PA-C  Fexofenadine HCl (ALLEGRA PO) Take 1 capsule by mouth daily.    Yes [provider]  hydrochlorothiazide (MICROZIDE) 12.5 MG capsule Take 1 capsule (12.5 mg total) by mouth daily. Patient  taking differently: Take 12.5 mg by mouth daily as needed (fluid).  04/08/19 04/07/20 Yes Glendale Chard, MD  irbesartan (AVAPRO) 150 MG tablet TAKE 1 TABLET BY MOUTH EVERY DAY. 03/17/19  Yes Glendale Chard, MD  loperamide (IMODIUM) 2 MG capsule Take 1 capsule (2 mg total) by mouth 4 (four) times daily as needed for diarrhea or loose stools. 02/13/19  Yes Couture, Cortni S, PA-C  Multiple Vitamins-Minerals (STRESS TAB NF PO) Take 1 tablet by mouth daily.   Yes [provider]  olopatadine (PATANOL) 0.1 % ophthalmic solution Place 1 drop into both eyes 2 (two) times daily. 06/15/19  Yes Padgett, Rae Halsted, MD  omeprazole (PRILOSEC) 20 MG capsule TAKE 1 CAPSULE BY MOUTH ONCE DAILY 30 MINUTES TO 1 HOUR BEFORE A MEAL Patient taking differently: Take 20 mg by mouth  daily.  10/29/18  Yes Glendale Chard, MD  rosuvastatin (CRESTOR) 20 MG tablet TAKE 1 TABLET BY MOUTH ONCE DAILY Patient taking differently: Take 20 mg by mouth daily.  10/29/18  Yes Glendale Chard, MD  trolamine salicylate (ASPERCREME) 10 % cream Apply 1 application topically as needed for muscle pain.   Yes [provider]  vitamin B-12 (CYANOCOBALAMIN) 1000 MCG tablet Take 1,000 mcg by mouth daily.   Yes [provider]  acetaminophen (TYLENOL) 325 MG tablet Take 2 tablets (650 mg total) by mouth every 6 (six) hours as needed for mild pain (or Fever >/= 101). Patient not taking: Reported on 07/11/2019 02/08/16   Irwin Brakeman L, MD  glucose blood (ONETOUCH VERIO) test strip 1 each by Other route as needed for other. Use as instructed to check blood pressure 2 times per day dx: e11.65 10/25/18   Glendale Chard, MD    Current Facility-Administered Medications  Medication Dose Route Frequency Provider Last Rate Last Dose  . 0.9 %  sodium chloride infusion   Intravenous PRN Lacretia Leigh, MD 10 mL/hr at 07/11/19 1816 500 mL at 07/11/19 1816  . albuterol (PROVENTIL) (2.5 MG/3ML) 0.083% nebulizer solution 2.5 mg  2.5  mg Nebulization Q4H PRN Dana Allan I, MD      . amLODipine (NORVASC) tablet 10 mg  10 mg Oral Daily Dana Allan I, MD   10 mg at 07/12/19 0941  . aspirin EC tablet 81 mg  81 mg Oral Daily Dana Allan I, MD   81 mg at 07/12/19 0941  . azelastine (ASTELIN) 0.1 % nasal spray 2 spray  2 spray Each Nare BID Dana Allan I, MD   2 spray at 07/12/19 0941  . ciprofloxacin (CIPRO) IVPB 400 mg  400 mg Intravenous Q12H Dana Allan I, MD 200 mL/hr at 07/12/19 0740 400 mg at 07/12/19 0740  . dicyclomine (BENTYL) capsule 10 mg  10 mg Oral BID Dana Allan I, MD   10 mg at 07/12/19 0941  . enoxaparin (LOVENOX) injection 40 mg  40 mg Subcutaneous Q24H Dana Allan I, MD   40 mg at 07/11/19 2205  . hydrALAZINE (APRESOLINE) tablet 25 mg  25 mg Oral Q6H PRN Dana Allan I, MD      . insulin aspart (novoLOG) injection 0-6 Units  0-6 Units Subcutaneous TID WC Dana Allan I, MD      . irbesartan (AVAPRO) tablet 150 mg  150 mg Oral Daily Dana Allan I, MD   150 mg at 07/12/19 0941  . loratadine (CLARITIN) tablet 10 mg  10 mg Oral Daily Dana Allan I, MD   10 mg at 07/12/19 0941  . metroNIDAZOLE (FLAGYL) IVPB 500 mg  500 mg Intravenous Q8H Dana Allan I, MD 100 mL/hr at 07/12/19 0400 500 mg at 07/12/19 0400  . olopatadine (PATANOL) 0.1 % ophthalmic solution 1 drop  1 drop Both Eyes BID Dana Allan I, MD   1 drop at 07/12/19 0942  . ondansetron (ZOFRAN) injection 4 mg  4 mg Intravenous Q6H PRN Dana Allan I, MD      . pantoprazole (PROTONIX) EC tablet 40 mg  40 mg Oral Daily Dana Allan I, MD   40 mg at 07/12/19 0941  . polyvinyl alcohol (LIQUIFILM TEARS) 1.4 % ophthalmic solution 1 drop  1 drop Both Eyes PRN Dana Allan I, MD      . rosuvastatin (CRESTOR) tablet 20 mg  20 mg Oral Daily Dana Allan I, MD   20 mg at 07/12/19  0940  . vitamin B-12 (CYANOCOBALAMIN) tablet 1,000 mcg  1,000 mcg Oral Daily Dana Allan I, MD    1,000 mcg at 07/12/19 0941  . vitamin C (ASCORBIC ACID) tablet 1,000 mg  1,000 mg Oral Daily Dana Allan I, MD   1,000 mg at 07/12/19 0941    Allergies as of 07/11/2019 - Review Complete 07/11/2019  Allergen Reaction Noted  . Amoxicillin Diarrhea 12/06/2011  . Ampicillin Rash 05/29/2016  . Sulfa antibiotics Rash 12/06/2011    Family History  Problem Relation Age of Onset  . Alzheimer's disease Mother   . Prostate cancer Father   . Brain cancer Brother   . Brain cancer Brother   . Pancreatic cancer Sister   . Allergic rhinitis Neg Hx   . Asthma Neg Hx   . Eczema Neg Hx   . Urticaria Neg Hx     Social History   Socioeconomic History  . Marital status: Widowed    Spouse name: Not on file  . Number of children: 2  . Years of education: Not on file  . Highest education level: Not on file  Occupational History  . Occupation: retired  Scientific laboratory technician  . Financial resource strain: Not hard at all  . Food insecurity    Worry: Never true    Inability: Never true  . Transportation needs    Medical: No    Non-medical: No  Tobacco Use  . Smoking status: Never Smoker  . Smokeless tobacco: Never Used  Substance and Sexual Activity  . Alcohol use: No  . Drug use: No  . Sexual activity: Not Currently    Birth control/protection: Surgical  Lifestyle  . Physical activity    Days per week: 7 days    Minutes per session: 20 min  . Stress: Not at all  Relationships  . Social Herbalist on phone: Not on file    Gets together: Not on file    Attends religious service: Not on file    Active member of club or organization: Not on file    Attends meetings of clubs or organizations: Not on file    Relationship status: Not on file  . Intimate partner violence    Fear of current or ex partner: No    Emotionally abused: No    Physically abused: No    Forced sexual activity: No  Other Topics Concern  . Not on file  Social History Narrative   Son [name] lives with  her    Review of Systems: All systems reviewed and negative except where noted in HPI.  Physical Exam: Vital signs in last 24 hours: Temp:  [97.9 F (36.6 C)-99.6 F (37.6 C)] 98 F (36.7 C) (12/08 0512) Pulse Rate:  [90-116] 90 (12/08 0512) Resp:  [16-20] 16 (12/08 0512) BP: (131-175)/(76-105) 131/83 (12/08 0512) SpO2:  [91 %-97 %] 94 % (12/08 0512) Weight:  [59 kg] 59 kg (12/07 1129) Last BM Date: 07/10/19 General:   Alert, well-developed,  female in NAD Psych:  Pleasant, cooperative. Normal mood and affect. Eyes:  Pupils equal, sclera clear, no icterus.   Conjunctiva pink. Ears:  Normal auditory acuity. Nose:  No deformity, discharge,  or lesions. Neck:  Supple; no masses Lungs:  Clear throughout to auscultation.   No wheezes, crackles, or rhonchi.  Heart:  Regular rate and rhythm; no murmurs, no lower extremity edema Abdomen:  Soft, non-distended, nontender, BS active, no palp mass   Rectal:  Deferred  Msk:  Symmetrical without gross deformities. . Neurologic:  Alert and  oriented x4;  grossly normal neurologically. Skin:  Intact without significant lesions or rashes.   Intake/Output from previous day: 12/07 0701 - 12/08 0700 In: 1637.7 [I.V.:937.7; IV Piggyback:700] Out: 400 [Urine:400] Intake/Output this shift: Total I/O In: 935.2 [P.O.:520; I.V.:215.2; IV Piggyback:200] Out: 300 [Urine:300]  Lab Results: Recent Labs    07/11/19 1209 07/12/19 0455  WBC 14.3* 10.7*  HGB 13.8 12.4  HCT 44.1 38.3  PLT 370 255   BMET Recent Labs    07/11/19 1209 07/12/19 0455  NA 142 139  K 3.6 3.8  CL 107 110  CO2 21* 20*  GLUCOSE 101* 98  BUN 20 13  CREATININE 0.97 0.89  CALCIUM 9.3 8.8*   LFT Recent Labs    07/11/19 1209  PROT 7.7  ALBUMIN 4.4  AST 19  ALT 17  ALKPHOS 82  BILITOT 0.6      . CBC Latest Ref Rng & Units 07/12/2019 07/11/2019 03/24/2019  WBC 4.0 - 10.5 K/uL 10.7(H) 14.3(H) 7.4  Hemoglobin 12.0 - 15.0 g/dL 12.4 13.8 13.0  Hematocrit  36.0 - 46.0 % 38.3 44.1 38.0  Platelets 150 - 400 K/uL 255 370 374    . CMP Latest Ref Rng & Units 07/12/2019 07/11/2019 05/26/2019  Glucose 70 - 99 mg/dL 98 101(H) 82  BUN 8 - 23 mg/dL 13 20 13   Creatinine 0.44 - 1.00 mg/dL 0.89 0.97 1.18(H)  Sodium 135 - 145 mmol/L 139 142 142  Potassium 3.5 - 5.1 mmol/L 3.8 3.6 4.0  Chloride 98 - 111 mmol/L 110 107 105  CO2 22 - 32 mmol/L 20(L) 21(L) 21  Calcium 8.9 - 10.3 mg/dL 8.8(L) 9.3 8.5(L)  Total Protein 6.5 - 8.1 g/dL - 7.7 7.1  Total Bilirubin 0.3 - 1.2 mg/dL - 0.6 0.3  Alkaline Phos 38 - 126 U/L - 82 96  AST 15 - 41 U/L - 19 20  ALT 0 - 44 U/L - 17 15   Studies/Results: Ct Abdomen Pelvis W Contrast  Result Date: 07/11/2019 CLINICAL DATA:  Diarrhea, vomiting, slight fever, generalized abdominal pain EXAM: CT ABDOMEN AND PELVIS WITH CONTRAST TECHNIQUE: Multidetector CT imaging of the abdomen and pelvis was performed using the standard protocol following bolus administration of intravenous contrast. Sagittal and coronal MPR images reconstructed from axial data set. CONTRAST:  169mL OMNIPAQUE IOHEXOL 300 MG/ML SOLN IV. No oral contrast. COMPARISON:  01/14/2019 FINDINGS: Lower chest: Calcified granuloma LEFT lower lobe image 22. Medial LEFT basilar scarring stable. Hepatobiliary: Gallbladder and liver normal appearance Pancreas: Atrophic without mass Spleen: Normal appearance. Tiny splenule. Adrenals/Urinary Tract: Adrenal glands normal appearance. Small BILATERAL renal cysts. 2.8 x 1.8 cm diameter mass at upper pole LEFT kidney with foci of macroscopic fat consistent with angiomyolipoma. Tiny nonobstructing LEFT renal calculus. No additional renal masses, hydronephrosis, or hydroureter. Bladder unremarkable. Stomach/Bowel: Moderate-sized hiatal hernia. Stomach otherwise normal appearance. Normal appendix. Small bowel loops normal appearance. Sigmoid diverticulosis. Wall thickening of sigmoid colon, to lesser degree distal transverse and proximal  descending colon, raising question of colitis. No definite pericolic inflammatory changes at sigmoid colon to suggest diverticulitis. Proximal colon unremarkable. Vascular/Lymphatic: Atherosclerotic calcifications aorta, coronary arteries, iliac arteries. Aorta normal caliber. Significant plaque at the origins of the celiac and superior mesenteric arteries. No adenopathy. Reproductive: Uterus surgically absent. Probable atrophic ovaries bilaterally. Other: No free air or free fluid. Small LEFT inguinal hernia containing fat. Musculoskeletal: Osseous demineralization with degenerative disc and facet disease changes of lumbar spine. Grade  2 anterolisthesis L5-S1 secondary to severe degenerative changes. IMPRESSION: Sigmoid diverticulosis. Wall thickening of the sigmoid colon, to lesser degree distal transverse and proximal descending colon, raising question of colitis ; differential diagnosis includes infection and inflammatory bowel disease, though patient does have significant atherosclerotic disease changes including at the origins of the celiac and superior mesenteric arteries and ischemic colitis is not excluded. Moderate-sized hiatal hernia. Small LEFT inguinal hernia containing fat. 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 Tiny nonobstructing LEFT renal calculus. Aortic Atherosclerosis (ICD10-I70.0). Electronically Signed   By: Lavonia Dana M.D.   On: 07/11/2019 17:23    Active Problems:   Colitis    Tye Savoy, NP-C @  07/12/2019, 10:20 AM

## 2019-07-12 NOTE — Progress Notes (Signed)
PROGRESS NOTE  Anna Hoffman  DOB: [birth year]  PCP: Glendale Chard, MD QIW:[id number]  DOA: 07/11/2019  LOS: 0 days   Chief Complaint  Patient presents with  . Emesis  . Diarrhea   Brief narrative: Patient is an 84 year old female with past medical history significant for diabetes mellitus, stroke, shingles, hypokalemia and hypertension. Patient presented to the ED on 12/7 with complaint of nausea, vomiting and diarrhea.  Chart reviewed.  Patient was seen by [facility] in the office in November for intermittent abdominal pain and diarrhea for few months.  CTAP with contrast in July showed no acute findings.  Labs were unremarkable. 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 April 2010, colonoscopy and removal of a 1 cm pedunculated polyp, biopsy showed benign polypoid colonic mucosa 12/7, patient presented to the ED with complaint of nausea, vomiting, nonbloody diarrhea generalized abdominal pain with fever up to 100 that started 3 days ago.  In the ED, patient hemodynamically stable.   Work-up showed WBC count elevated to 14.3. CT scan of the abdomen and pelvis showed thickening of the wall of the sigmoid colon, distal transverse and proximal descending colon worrisome for colitis. Patient was started on IV ciprofloxacin and IV Flagyl. Patient was admitted to hospitalist medicine service for further evaluation and management.   GI consult appreciated.  Subjective: Patient was seen and examined this morning.  GI team was in the room as well. Patient was lying down in bed.  Not in distress. Patient has not had any diarrhea since admission.  Continues to have nausea and had poor intake.  Stool for C. difficile negative.  Assessment/Plan: Acute worsening of chronic intermittent diarrhea Infectious colitis -Presented with nausea, vomiting diarrhea, abdominal pain and fever for 2 days. -CT scan showed thickening of the wall of sigmoid  colon, distal transverse colon proximal descending colon. -WBC count was elevated as well. -C. difficile assay negative. -GI consult appreciated. -Suspected non-C. diff infectious colitis.  Currently on IV ciprofloxacin and Flagyl. -Diarrhea stopped but continue to have nausea and poor appetite.  Currently on clear liquid diet.  Advance diet as tolerated. -Hopefully home in next 1 to 2 days. -May need colonoscopy in 4 to 6 weeks.  Follow-up with GI as an outpatient.  Hypertension: None -Home meds include amlodipine, HCTZ, irbesartan. -Continue amlodipine and losartan.  HCTZ remain on hold.  Diabetes mellitus: -Diet and exercise.  Body mass index is 21.3 kg/m. Mobility: Encourage ambulation Diet: Clear liquid diet.  Advance as tolerated Fluid: Normal saline at 50 mils per hour DVT prophylaxis:  Lovenox Code Status:  Full code Family Communication:  Not at bedside Expected Discharge:  Home next 1 to 2 days  Consultants:    Procedures:   Antimicrobials: Anti-infectives (From admission, onward)   Start     Dose/Rate Route Frequency Ordered Stop   07/11/19 2000  ciprofloxacin (CIPRO) IVPB 400 mg     400 mg 200 mL/hr over 60 Minutes Intravenous Every 12 hours 07/11/19 1958     07/11/19 2000  metroNIDAZOLE (FLAGYL) IVPB 500 mg     500 mg 100 mL/hr over 60 Minutes Intravenous Every 8 hours 07/11/19 1958     07/11/19 1815  ciprofloxacin (CIPRO) IVPB 400 mg     400 mg 200 mL/hr over 60 Minutes Intravenous  Once 07/11/19 1800 07/11/19 1933   07/11/19 1815  metroNIDAZOLE (FLAGYL) IVPB 500 mg     500 mg 100 mL/hr over  60 Minutes Intravenous  Once 07/11/19 1800 07/11/19 2034        Code Status: Full Code   Diet Order            Diet clear liquid Room service appropriate? Yes; Fluid consistency: Thin  Diet effective now              Infusions:  . sodium chloride 500 mL (07/11/19 1816)  . ciprofloxacin 400 mg (07/12/19 0740)  . metronidazole 500 mg (07/12/19 1153)     Scheduled Meds: . amLODipine  10 mg Oral Daily  . aspirin EC  81 mg Oral Daily  . azelastine  2 spray Each Nare BID  . dicyclomine  10 mg Oral BID  . enoxaparin (LOVENOX) injection  40 mg Subcutaneous Q24H  . insulin aspart  0-6 Units Subcutaneous TID WC  . irbesartan  150 mg Oral Daily  . loratadine  10 mg Oral Daily  . olopatadine  1 drop Both Eyes BID  . pantoprazole  40 mg Oral Daily  . rosuvastatin  20 mg Oral Daily  . vitamin B-12  1,000 mcg Oral Daily  . vitamin C  1,000 mg Oral Daily    PRN meds: sodium chloride, albuterol, hydrALAZINE, ondansetron (ZOFRAN) IV, polyvinyl alcohol   Objective: Vitals:   07/12/19 0512 07/12/19 1319  BP: 131/83 140/78  Pulse: 90 90  Resp: 16 16  Temp: 98 F (36.7 C) 98.3 F (36.8 C)  SpO2: 94% 95%    Intake/Output Summary (Last 24 hours) at 07/12/2019 1339 Last data filed at 07/12/2019 1012 Gross per 24 hour  Intake 2572.86 ml  Output 700 ml  Net 1872.86 ml   Filed Weights   07/11/19 1129  Weight: 59 kg   Weight change:  Body mass index is 21.3 kg/m.   Physical Exam: General exam: Appears calm and comfortable.  Skin: No rashes, lesions or ulcers. HEENT: Atraumatic, normocephalic, supple neck, no obvious bleeding Lungs: Clear to auscultate bilaterally CVS: Regular rate and rhythm, no murmur GI/Abd soft, mild diffuse tenderness, bowel sound present CNS: Alert, awake, oriented x3 Psychiatry: Mood appropriate Extremities: No pedal edema, no calf tenderness-  Data Review: I have personally reviewed the laboratory data and studies available.  Recent Labs  Lab 07/11/19 1209 07/12/19 0455  WBC 14.3* 10.7*  HGB 13.8 12.4  HCT 44.1 38.3  MCV 91.5 90.8  PLT 370 255   Recent Labs  Lab 07/11/19 1209 07/12/19 0455  NA 142 139  K 3.6 3.8  CL 107 110  CO2 21* 20*  GLUCOSE 101* 98  BUN 20 13  CREATININE 0.97 0.89  CALCIUM 9.3 8.8*  MG 2.3  --   PHOS 3.5  --     Terrilee Croak, MD  [facility] 07/12/2019

## 2019-07-12 NOTE — Progress Notes (Signed)
CBG 96 (did not transfer over to results). Norlene Duel RN, BSN

## 2019-07-13 LAB — GLUCOSE, CAPILLARY
Glucose-Capillary: 79 mg/dL (ref 70–99)
Glucose-Capillary: 92 mg/dL (ref 70–99)
Glucose-Capillary: 96 mg/dL (ref 70–99)
Glucose-Capillary: 98 mg/dL (ref 70–99)
Glucose-Capillary: 85 mg/dL (ref 70–99)
Glucose-Capillary: 154 mg/dL — ABNORMAL HIGH (ref 70–99)
Glucose-Capillary: 71 mg/dL (ref 70–99)
Glucose-Capillary: 161 mg/dL — ABNORMAL HIGH (ref 70–99)

## 2019-07-13 MED ORDER — CIPROFLOXACIN HCL 500 MG PO TABS
500.0000 mg | ORAL_TABLET | Freq: Two times a day (BID) | ORAL | Status: DC
  Administered 2019-07-13 – 2019-07-14 (×2): 500 mg via ORAL
  Filled 2019-07-13 (×2): qty 1

## 2019-07-13 MED ORDER — BOOST / RESOURCE BREEZE PO LIQD CUSTOM
1.0000 | Freq: Three times a day (TID) | ORAL | Status: DC
  Administered 2019-07-13 – 2019-07-14 (×3): 1 via ORAL

## 2019-07-13 MED ORDER — CALCIUM CARBONATE ANTACID 500 MG PO CHEW
1.0000 | CHEWABLE_TABLET | Freq: Three times a day (TID) | ORAL | Status: DC | PRN
  Administered 2019-07-13: 200 mg via ORAL
  Filled 2019-07-13: qty 1

## 2019-07-13 MED ORDER — METRONIDAZOLE 500 MG PO TABS
500.0000 mg | ORAL_TABLET | Freq: Three times a day (TID) | ORAL | Status: DC
  Administered 2019-07-13 – 2019-07-14 (×3): 500 mg via ORAL
  Filled 2019-07-13 (×3): qty 1

## 2019-07-13 NOTE — Progress Notes (Signed)
Progress Note    ASSESSMENT AND PLAN:   Intermittent diarrhea since June, in process of outpatient evaluation. Now admitted with nausea, vomiting, generalized abdominal pain and acute worsening of chronic intermittent diarrhea. Wall thickening of sigmoid colon and to a lesser extent the transverse Her white count has nearly normalized on antibiotics. Afebrile. She could have infectious colitis ( non-C-diff). Ischemic colitis seems less likely at this point. IBD not excluded. --No further diarrhea just gas. Some mild abdominal discomfort with flatus but otherwise no pain. Tolerating liquids, wants solids. Will try low fiber diet, if tolerates then can be discharged from GI standpoint. Will discharge on Cipro / Flagyl for 5 more day. .  -Follow up with Alonza Bogus , PA on 12/22 at 1:30pm   SUBJECTIVE    Feels fine, no further diarrhea. No abdominal pain.  She is hungry   OBJECTIVE:     Vital signs in last 24 hours: Temp:  [98.3 F (36.8 C)-98.4 F (36.9 C)] 98.3 F (36.8 C) (12/09 0551) Pulse Rate:  [85-90] 85 (12/09 0551) Resp:  [16-18] 18 (12/09 0551) BP: (117-140)/(72-80) 117/72 (12/09 0551) SpO2:  [95 %-96 %] 96 % (12/09 0551) Last BM Date: 07/10/19 General:   Alert, well-developed female in NAD EENT:  Normal hearing, non icteric sclera   Heart:  Regular rate and rhythm;  No lower extremity edema   Pulm: Normal respiratory effort   Abdomen:  Soft, nondistended, nontender.  Normal bowel sounds.          Neurologic:  Alert and  oriented x4;  grossly normal neurologically. Psych:  Pleasant, cooperative.  Normal mood and affect.   Intake/Output from previous day: 12/08 0701 - 12/09 0700 In: 3141 [P.O.:1420; I.V.:1021; IV Piggyback:700] Out: 2150 [Urine:2150] Intake/Output this shift: Total I/O In: -  Out: 500 [Urine:500]  Lab Results: Recent Labs    07/11/19 1209 07/12/19 0455  WBC 14.3* 10.7*  HGB 13.8 12.4  HCT 44.1 38.3  PLT 370 255   BMET Recent  Labs    07/11/19 1209 07/12/19 0455  NA 142 139  K 3.6 3.8  CL 107 110  CO2 21* 20*  GLUCOSE 101* 98  BUN 20 13  CREATININE 0.97 0.89  CALCIUM 9.3 8.8*   LFT Recent Labs    07/11/19 1209  PROT 7.7  ALBUMIN 4.4  AST 19  ALT 17  ALKPHOS 82  BILITOT 0.6    Ct Abdomen Pelvis W Contrast  Result Date: 07/11/2019 CLINICAL DATA:  Diarrhea, vomiting, slight fever, generalized abdominal pain EXAM: CT ABDOMEN AND PELVIS WITH CONTRAST TECHNIQUE: Multidetector CT imaging of the abdomen and pelvis was performed using the standard protocol following bolus administration of intravenous contrast. Sagittal and coronal MPR images reconstructed from axial data set. CONTRAST:  164mL OMNIPAQUE IOHEXOL 300 MG/ML SOLN IV. No oral contrast. COMPARISON:  01/14/2019 FINDINGS: Lower chest: Calcified granuloma LEFT lower lobe image 22. Medial LEFT basilar scarring stable. Hepatobiliary: Gallbladder and liver normal appearance Pancreas: Atrophic without mass Spleen: Normal appearance. Tiny splenule. Adrenals/Urinary Tract: Adrenal glands normal appearance. Small BILATERAL renal cysts. 2.8 x 1.8 cm diameter mass at upper pole LEFT kidney with foci of macroscopic fat consistent with angiomyolipoma. Tiny nonobstructing LEFT renal calculus. No additional renal masses, hydronephrosis, or hydroureter. Bladder unremarkable. Stomach/Bowel: Moderate-sized hiatal hernia. Stomach otherwise normal appearance. Normal appendix. Small bowel loops normal appearance. Sigmoid diverticulosis. Wall thickening of sigmoid colon, to lesser degree distal transverse and proximal descending colon, raising question of colitis. No definite  pericolic inflammatory changes at sigmoid colon to suggest diverticulitis. Proximal colon unremarkable. Vascular/Lymphatic: Atherosclerotic calcifications aorta, coronary arteries, iliac arteries. Aorta normal caliber. Significant plaque at the origins of the celiac and superior mesenteric arteries. No  adenopathy. Reproductive: Uterus surgically absent. Probable atrophic ovaries bilaterally. Other: No free air or free fluid. Small LEFT inguinal hernia containing fat. Musculoskeletal: Osseous demineralization with degenerative disc and facet disease changes of lumbar spine. Grade 2 anterolisthesis L5-S1 secondary to severe degenerative changes. IMPRESSION: Sigmoid diverticulosis. Wall thickening of the sigmoid colon, to lesser degree distal transverse and proximal descending colon, raising question of colitis ; differential diagnosis includes infection and inflammatory bowel disease, though patient does have significant atherosclerotic disease changes including at the origins of the celiac and superior mesenteric arteries and ischemic colitis is not excluded. Moderate-sized hiatal hernia. Small LEFT inguinal hernia containing fat. 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 Tiny nonobstructing LEFT renal calculus. Aortic Atherosclerosis (ICD10-I70.0). Electronically Signed   By: Lavonia Dana M.D.   On: 07/11/2019 17:23   Active Problems:   Colitis     LOS: 1 day   Tye Savoy ,NP 07/13/2019, 9:49 AM

## 2019-07-13 NOTE — Progress Notes (Signed)
Initial Nutrition Assessment  INTERVENTION:   -Boost Breeze po TID, each supplement provides 250 kcal and 9 grams of protein  NUTRITION DIAGNOSIS:   Increased nutrient needs related to acute illness as evidenced by estimated needs.  GOAL:   Patient will meet greater than or equal to 90% of their needs  MONITOR:   PO intake, Supplement acceptance, Labs, Weight trends, I & O's  REASON FOR ASSESSMENT:   Malnutrition Screening Tool    ASSESSMENT:   84 year old female with past medical history significant for diabetes mellitus, stroke, shingles, hypokalemia and hypertension.Patient presented to the ED on 12/7 with complaint of nausea, vomiting and diarrhea.  **RD working remotely**  Patient reports having N/V/diarrhea since 12/6. Pt evaluated by GI, pt to follow-up with GI after discharge.  Pt's diet was advanced to clears on 12/7. After tolerating clears, diet was advanced to regular today. Pt consumed 75% of lunch. No further N/V at this time. Will order Boost Breeze supplements for additional kcals and protein.  Per weight records, pt has lost 8 lbs since 10/22 (5% wt loss x 1.5 months, insignificant for time frame).   I/Os: +2.1L since admit UOP: 1750 ml x 24 hrs  Medications: Vitamin B-12 tablet, Vitamin C tablet, calcium carbonate tablet Labs reviewed: CBGs: 85-154  NUTRITION - FOCUSED PHYSICAL EXAM:  Working remotely.  Diet Order:   Diet Order            Diet regular Room service appropriate? Yes; Fluid consistency: Thin  Diet effective now              EDUCATION NEEDS:   No education needs have been identified at this time  Skin:  Skin Assessment: Reviewed RN Assessment  Last BM:  12/6  Height:   Ht Readings from Last 1 Encounters:  07/11/19 5' 5.5" (1.664 m)    Weight:   Wt Readings from Last 1 Encounters:  07/11/19 59 kg    Ideal Body Weight:  56.8 kg  BMI:  Body mass index is 21.3 kg/m.  Estimated Nutritional Needs:   Kcal:   1500-1700  Protein:  70-80g  Fluid:  1.7L/day  Clayton Bibles, MS, RD, LDN Inpatient Clinical Dietitian Pager: [phone] After Hours Pager: [phone]

## 2019-07-13 NOTE — Progress Notes (Signed)
PROGRESS NOTE  Anna Hoffman  DOB: 1934-10-15  PCP: Glendale Chard, MD MPN:[id number]  DOA: 07/11/2019  LOS: 1 day   Chief Complaint  Patient presents with  . Emesis  . Diarrhea   Brief narrative: Patient is an 84 year old female with past medical history significant for diabetes mellitus, stroke, shingles, hypokalemia and hypertension. Patient presented to the ED on 12/7 with complaint of nausea, vomiting and diarrhea.  Chart reviewed.  Patient was seen by [facility] in the office in November for intermittent abdominal pain and diarrhea for few months.  CTAP with contrast in July showed no acute findings.  Labs were unremarkable. 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 April 2010, colonoscopy and removal of a 1 cm pedunculated polyp, biopsy showed benign polypoid colonic mucosa 12/7, patient presented to the ED with complaint of nausea, vomiting, nonbloody diarrhea generalized abdominal pain with fever up to 100 that started 3 days ago.  In the ED, patient hemodynamically stable.   Work-up showed WBC count elevated to 14.3. CT scan of the abdomen and pelvis showed thickening of the wall of the sigmoid colon, distal transverse and proximal descending colon worrisome for colitis. Patient was started on IV ciprofloxacin and IV Flagyl. Patient was admitted to hospitalist medicine service for further evaluation and management.   GI consult appreciated.  Subjective: She had no diarrhea so far since her hospitalization. She feels better. He is hungry. Has had occasional heartburn.   Assessment/Plan: Acute worsening of chronic intermittent diarrhea with Infectious colitis -Presented with nausea, vomiting diarrhea, abdominal pain and fever for 2 days. -CT scan showed thickening of the wall of sigmoid colon, distal transverse colon proximal descending colon. -WBC count was elevated as well. -C. difficile assay negative. -GI consult  appreciated. Suspected non-C. diff infectious colitis.  Currently on IV ciprofloxacin and Flagyl but will transition to po in anticipation for potential dc tomorrow. -Diarrhea stopped but continue to have nausea and poor appetite.  Currently on clear liquid diet but being advanced today. Will start Tums as needed for heartburn.  -May need colonoscopy in 4 to 6 weeks.  Follow-up with GI as an outpatient.  Hypertension: None -Home meds include amlodipine, HCTZ, irbesartan. -Continue amlodipine and losartan.  HCTZ remain on hold.  Diabetes mellitus: -Stable, continue current regimen.   Body mass index is 21.3 kg/m. Mobility: Encourage ambulation Diet: Clear liquid diet.  Advance as tolerated Fluid: Normal saline at 50 mils per hour DVT prophylaxis:  Lovenox Code Status:  Full code Family Communication:  Not at bedside Expected Discharge:  Home next 1 to 2 days  Consultants: GI  Procedures: None  Antimicrobials: Anti-infectives (From admission, onward)   Start     Dose/Rate Route Frequency Ordered Stop   07/13/19 2000  ciprofloxacin (CIPRO) tablet 500 mg     500 mg Oral 2 times daily 07/13/19 1347     07/13/19 1400  metroNIDAZOLE (FLAGYL) tablet 500 mg     500 mg Oral Every 8 hours 07/13/19 1347     07/11/19 2000  ciprofloxacin (CIPRO) IVPB 400 mg  Status:  Discontinued     400 mg 200 mL/hr over 60 Minutes Intravenous Every 12 hours 07/11/19 1958 07/13/19 1347   07/11/19 2000  metroNIDAZOLE (FLAGYL) IVPB 500 mg  Status:  Discontinued     500 mg 100 mL/hr over 60 Minutes Intravenous Every 8 hours 07/11/19 1958 07/13/19 1347   07/11/19 1815  ciprofloxacin (CIPRO) IVPB  400 mg     400 mg 200 mL/hr over 60 Minutes Intravenous  Once 07/11/19 1800 07/11/19 1933   07/11/19 1815  metroNIDAZOLE (FLAGYL) IVPB 500 mg     500 mg 100 mL/hr over 60 Minutes Intravenous  Once 07/11/19 1800 07/11/19 2034        Code Status: Full Code   Diet Order            Diet regular Room service  appropriate? Yes; Fluid consistency: Thin  Diet effective now              Infusions:  . sodium chloride 500 mL (07/11/19 1816)    Scheduled Meds: . amLODipine  10 mg Oral Daily  . aspirin EC  81 mg Oral Daily  . azelastine  2 spray Each Nare BID  . ciprofloxacin  500 mg Oral BID  . dicyclomine  10 mg Oral BID  . enoxaparin (LOVENOX) injection  40 mg Subcutaneous Q24H  . insulin aspart  0-6 Units Subcutaneous TID WC  . irbesartan  150 mg Oral Daily  . loratadine  10 mg Oral Daily  . metroNIDAZOLE  500 mg Oral Q8H  . olopatadine  1 drop Both Eyes BID  . pantoprazole  40 mg Oral Daily  . rosuvastatin  20 mg Oral Daily  . vitamin B-12  1,000 mcg Oral Daily  . vitamin C  1,000 mg Oral Daily    PRN meds: sodium chloride, albuterol, calcium carbonate, hydrALAZINE, ondansetron (ZOFRAN) IV, polyvinyl alcohol   Objective: Vitals:   07/12/19 2112 07/13/19 0551  BP: 138/80 117/72  Pulse: 89 85  Resp: 18 18  Temp: 98.4 F (36.9 C) 98.3 F (36.8 C)  SpO2: 95% 96%    Intake/Output Summary (Last 24 hours) at 07/13/2019 1347 Last data filed at 07/13/2019 1300 Gross per 24 hour  Intake 3373.36 ml  Output 3300 ml  Net 73.36 ml   Filed Weights   07/11/19 1129  Weight: 59 kg   Weight change:  Body mass index is 21.3 kg/m.   Physical Exam: General exam: NAD. Sitting in bed.  Skin: No rashes, lesions or ulcers. HEENT: Atraumatic, normocephalic. Lungs: no respiratory distress.  CVS: Regular rate and rhythm GI/Abd soft, mild diffuse tenderness, no rebound tenderness, no guarding.  CNS: Alert, awake, oriented x3 Psychiatry: Mood appropriate Extremities: No pedal edema, no calf tenderness.  Data Review: I have personally reviewed the laboratory data and studies available.  Recent Labs  Lab 07/11/19 1209 07/12/19 0455  WBC 14.3* 10.7*  HGB 13.8 12.4  HCT 44.1 38.3  MCV 91.5 90.8  PLT 370 255   Recent Labs  Lab 07/11/19 1209 07/12/19 0455  NA 142 139  K 3.6  3.8  CL 107 110  CO2 21* 20*  GLUCOSE 101* 98  BUN 20 13  CREATININE 0.97 0.89  CALCIUM 9.3 8.8*  MG 2.3  --   PHOS 3.5  --     Blain Pais, MD  [facility] 07/13/2019

## 2019-07-14 LAB — COMPREHENSIVE METABOLIC PANEL
ALT: 11 U/L (ref 0–44)
AST: 17 U/L (ref 15–41)
Albumin: 3.4 g/dL — ABNORMAL LOW (ref 3.5–5.0)
Alkaline Phosphatase: 59 U/L (ref 38–126)
Anion gap: 9 (ref 5–15)
BUN: 13 mg/dL (ref 8–23)
CO2: 20 mmol/L — ABNORMAL LOW (ref 22–32)
Calcium: 8.8 mg/dL — ABNORMAL LOW (ref 8.9–10.3)
Chloride: 111 mmol/L (ref 98–111)
Creatinine, Ser: 0.97 mg/dL (ref 0.44–1.00)
GFR calc Af Amer: >60 mL/min (ref >60)
GFR calc non Af Amer: 54 mL/min — ABNORMAL LOW (ref >60)
Glucose, Bld: 108 mg/dL — ABNORMAL HIGH (ref 70–99)
Potassium: 3.8 mmol/L (ref 3.5–5.1)
Sodium: 140 mmol/L (ref 135–145)
Total Bilirubin: 0.5 mg/dL (ref 0.3–1.2)
Total Protein: 6.1 g/dL — ABNORMAL LOW (ref 6.5–8.1)

## 2019-07-14 LAB — GLUCOSE, CAPILLARY
Glucose-Capillary: 99 mg/dL (ref 70–99)
Glucose-Capillary: 100 mg/dL — ABNORMAL HIGH (ref 70–99)

## 2019-07-14 MED ORDER — CIPROFLOXACIN HCL 500 MG PO TABS: 500.0000 mg | ORAL_TABLET | Freq: Two times a day (BID) | ORAL | 0 refills | Status: DC

## 2019-07-14 MED ORDER — ACETAMINOPHEN 325 MG PO TABS
650.0000 mg | ORAL_TABLET | Freq: Four times a day (QID) | ORAL | Status: DC | PRN
  Administered 2019-07-14: 650 mg via ORAL
  Filled 2019-07-14: qty 2

## 2019-07-14 MED ORDER — METRONIDAZOLE 500 MG PO TABS: 500.0000 mg | ORAL_TABLET | Freq: Three times a day (TID) | ORAL | 0 refills | Status: DC

## 2019-07-14 MED FILL — metroNIDAZOLE 500 MG TABS: 500 | 4 days supply | Qty: 12 | Fill #0

## 2019-07-14 MED FILL — CIPROFLOXACIN HCL 500 MG TA: 500 | 4 days supply | Qty: 8 | Fill #0

## 2019-07-14 NOTE — Progress Notes (Signed)
Pt was discharged home today. Instructions were reviewed with patient, and questions were answered. Pt was taken to main entrance via wheelchair by NT.  

## 2019-07-14 NOTE — Discharge Summary (Signed)
Physician Discharge Summary  Reis Pienta [facility] OJJ:[id number] DOB: 1935/04/04 DOA: 07/11/2019  PCP: Glendale Chard, MD  Admit date: 07/11/2019 Discharge date: 07/14/2019  Admitted From: Home Disposition: Home  Recommendations for Outpatient Follow-up:  1. Follow up with PCP in 1-2 weeks   Home Health: No but needs to discuss with PCP if needed vs outpatient PT.  Equipment/Devices:None.   Discharge Condition:Stable.  CODE STATUS:Full Diet recommendation: Low residue, low fiber diet.  Brief/Interim Summary: Patient is an 84 year old female with past medical history significant for diabetes mellitus, stroke, shingles, hypokalemia and hypertension. Patient presented to the ED on 12/7 with complaint of nausea, vomiting and diarrhea.  Chart reviewed.  Patient was seen by [facility] in the office in November for intermittent abdominal pain and diarrhea for few months.  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 April 2010, colonoscopy and removal of a 1 cm pedunculated polyp, biopsy showed benign polypoid colonic mucosa 12/7, patient presented to the ED with complaint of nausea, vomiting, nonbloody diarrhea generalized abdominal pain with fever up to 100 that started 3 days ago.  In the ED, patient hemodynamically stable.   Work-up showed WBC count elevated to 14.3. CT scan of the abdomen and pelvis showed thickening of the wall of the sigmoid colon, distal transverse and proximal descending colon worrisome for colitis. Patient was started on IV ciprofloxacin and IV Flagyl. Patient was admitted to hospitalist medicine service for further evaluation and management.   GI consult appreciated  Hospital course by A&P: Acute worsening of chronic intermittent diarrhea with Infectious colitis -Presented with nausea, vomiting diarrhea, abdominal pain and fever for 2 days. -CT  scan showed thickening of the wall of sigmoid colon, distal transverse colon proximal descending colon. -WBC count was elevated as well. -C. difficile assay negative. -GI consult appreciated. Suspected non-C. diff infectious colitis.  She was on  IV ciprofloxacin and Flagyl but will transition to po prior to discharge and tolerated it well.  -Diarrhea stopped. Her appetite is improving. She should continue a low fiber, low residue diet.   -May need colonoscopy in 4 to 6 weeks.  Follow-up with GI as an outpatient.  Hypertension: None -Home meds include amlodipine, HCTZ, irbesartan and will be resumed.  Diabetes mellitus: -Stable, continue current regimen.   Body mass index is 21.3 kg/m.   GI consultant note from 12/9: "I have taken an interval history, reviewed the chart and examined the patient.   Patient clinically stable.  No significant abdominal pain at this time.  We will advance her diet and see how she does.  Patient will need a follow-up colonoscopy, most likely in 4 to 6 weeks.  A follow-up in clinic has been scheduled with PA Zehr scheduled for later this month.  Colonoscopy can then be arranged thereafter.  We will continue antibiotics as previously discussed to decrease risk of translocation in this elderly patient with comorbidities.  Hopefully discharge soon.  GI will sign off, please call and let us know if we can be of further assistance.  I agree with the Advanced Practitioner's note, impression, and recommendations with updates and my documentation above.   Justice Britain, MD [facility] Office # [phone]"  Discharge Diagnoses:  Principal Problem:   Colitis Active Problems:   Diarrhea   Benign essential HTN   Type 2 diabetes mellitus with stage 3 chronic kidney disease, without long-term current  use of insulin [facility])    Discharge Instructions  Discharge Instructions    Diet - low sodium heart healthy   Complete by:  As directed    Low fiber, low residual diet   Increase activity slowly   Complete by: As directed      Allergies as of 07/14/2019      Reactions   Amoxicillin Diarrhea   Has patient had a PCN reaction causing immediate rash, facial/tongue/throat swelling, SOB or lightheadedness with hypotension: no Has patient had a PCN reaction causing severe rash involving mucus membranes or skin necrosis: no Has patient had a PCN reaction that required hospitalization: no pharmacist consult Has patient had a PCN reaction occurring within the last 10 years: yes If all of the above answers are "NO", then may proceed with Cephalosporin use.   Ampicillin Rash   Has patient had a PCN reaction causing immediate rash, facial/tongue/throat swelling, SOB or lightheadedness with hypotension: No Has patient had a PCN reaction causing severe rash involving mucus membranes or skin necrosis: No Has patient had a PCN reaction that required hospitalization No Has patient had a PCN reaction occurring within the last 10 years: No If all of the above answers are "NO", then may proceed with Cephalosporin use.   Sulfa Antibiotics Rash      Medication List    TAKE these medications   acetaminophen 325 MG tablet Commonly known as: TYLENOL Take 2 tablets (650 mg total) by mouth every 6 (six) hours as needed for mild pain (or Fever >/= 101).   albuterol 108 (90 Base) MCG/ACT inhaler Commonly known as: VENTOLIN HFA Inhale 1-2 puffs into the lungs every 4 (four) hours as needed for wheezing or shortness of breath.   ALLEGRA PO Take 1 capsule by mouth daily.   amLODipine 10 MG tablet Commonly known as: NORVASC TAKE 1 TABLET BY MOUTH ONCE DAILY   aspirin EC 81 MG tablet Take 81 mg by mouth daily.   azelastine 0.1 % nasal spray Commonly known as: ASTELIN Place 2 sprays into both nostrils 2 (two) times daily. Use in each nostril as directed   ciprofloxacin 500 MG tablet Commonly known as: CIPRO Take 1 tablet  (500 mg total) by mouth 2 (two) times daily.   dicyclomine 10 MG capsule Commonly known as: BENTYL Take 1 capsule (10 mg total) by mouth 2 (two) times daily.   glucose blood test strip Commonly known as: OneTouch Verio 1 each by Other route as needed for other. Use as instructed to check blood pressure 2 times per day dx: e11.65   hydrochlorothiazide 12.5 MG capsule Commonly known as: Microzide Take 1 capsule (12.5 mg total) by mouth daily. What changed:   when to take this  reasons to take this   irbesartan 150 MG tablet Commonly known as: AVAPRO TAKE 1 TABLET BY MOUTH EVERY DAY.   loperamide 2 MG capsule Commonly known as: IMODIUM Take 1 capsule (2 mg total) by mouth 4 (four) times daily as needed for diarrhea or loose stools.   LUBRICATING EYE DROPS OP Apply 1 drop to eye daily as needed (dry eyes).   metroNIDAZOLE 500 MG tablet Commonly known as: FLAGYL Take 1 tablet (500 mg total) by mouth 3 (three) times daily.   olopatadine 0.1 % ophthalmic solution Commonly known as: Patanol Place 1 drop into both eyes 2 (two) times daily.   omeprazole 20 MG capsule Commonly known as: PRILOSEC TAKE 1 CAPSULE BY MOUTH ONCE DAILY 30 MINUTES TO 1 HOUR  BEFORE A MEAL What changed: See the new instructions.   rosuvastatin 20 MG tablet Commonly known as: CRESTOR TAKE 1 TABLET BY MOUTH ONCE DAILY   STRESS TAB NF PO Take 1 tablet by mouth daily.   trolamine salicylate 10 % cream Commonly known as: ASPERCREME Apply 1 application topically as needed for muscle pain.   vitamin B-12 1000 MCG tablet Commonly known as: CYANOCOBALAMIN Take 1,000 mcg by mouth daily.   vitamin C 1000 MG tablet Take 1,000 mg by mouth daily.      Follow-up Information    Zehr, Laban Emperor, PA-C Follow up on 07/26/2019.   Specialty: Gastroenterology Why: at 1:30pm, please arrive 10 minutes early Contact information: [place] [address] [phone]        Glendale Chard, MD  Follow up in 1 week(s).   Specialty: Internal Medicine Contact information: [address] [place] [address] [phone]          Allergies  Allergen Reactions  . Amoxicillin Diarrhea    Has patient had a PCN reaction causing immediate rash, facial/tongue/throat swelling, SOB or lightheadedness with hypotension: no Has patient had a PCN reaction causing severe rash involving mucus membranes or skin necrosis: no Has patient had a PCN reaction that required hospitalization: no pharmacist consult Has patient had a PCN reaction occurring within the last 10 years: yes If all of the above answers are "NO", then may proceed with Cephalosporin use.   . Ampicillin Rash    Has patient had a PCN reaction causing immediate rash, facial/tongue/throat swelling, SOB or lightheadedness with hypotension: No Has patient had a PCN reaction causing severe rash involving mucus membranes or skin necrosis: No Has patient had a PCN reaction that required hospitalization No Has patient had a PCN reaction occurring within the last 10 years: No If all of the above answers are "NO", then may proceed with Cephalosporin use.   . Sulfa Antibiotics Rash    Consultations:  [facility]   Procedures/Studies: CT ABDOMEN PELVIS W CONTRAST  Result Date: 07/11/2019 CLINICAL DATA:  Diarrhea, vomiting, slight fever, generalized abdominal pain EXAM: CT ABDOMEN AND PELVIS WITH CONTRAST TECHNIQUE: Multidetector CT imaging of the abdomen and pelvis was performed using the standard protocol following bolus administration of intravenous contrast. Sagittal and coronal MPR images reconstructed from axial data set. CONTRAST:  169mL OMNIPAQUE IOHEXOL 300 MG/ML SOLN IV. No oral contrast. COMPARISON:  01/14/2019 FINDINGS: Lower chest: Calcified granuloma LEFT lower lobe image 22. Medial LEFT basilar scarring stable. Hepatobiliary: Gallbladder and liver normal appearance Pancreas: Atrophic without mass Spleen:  Normal appearance. Tiny splenule. Adrenals/Urinary Tract: Adrenal glands normal appearance. Small BILATERAL renal cysts. 2.8 x 1.8 cm diameter mass at upper pole LEFT kidney with foci of macroscopic fat consistent with angiomyolipoma. Tiny nonobstructing LEFT renal calculus. No additional renal masses, hydronephrosis, or hydroureter. Bladder unremarkable. Stomach/Bowel: Moderate-sized hiatal hernia. Stomach otherwise normal appearance. Normal appendix. Small bowel loops normal appearance. Sigmoid diverticulosis. Wall thickening of sigmoid colon, to lesser degree distal transverse and proximal descending colon, raising question of colitis. No definite pericolic inflammatory changes at sigmoid colon to suggest diverticulitis. Proximal colon unremarkable. Vascular/Lymphatic: Atherosclerotic calcifications aorta, coronary arteries, iliac arteries. Aorta normal caliber. Significant plaque at the origins of the celiac and superior mesenteric arteries. No adenopathy. Reproductive: Uterus surgically absent. Probable atrophic ovaries bilaterally. Other: No free air or free fluid. Small LEFT inguinal hernia containing fat. Musculoskeletal: Osseous demineralization with degenerative disc and facet disease changes of lumbar spine.  Grade 2 anterolisthesis L5-S1 secondary to severe degenerative changes. IMPRESSION: Sigmoid diverticulosis. Wall thickening of the sigmoid colon, to lesser degree distal transverse and proximal descending colon, raising question of colitis ; differential diagnosis includes infection and inflammatory bowel disease, though patient does have significant atherosclerotic disease changes including at the origins of the celiac and superior mesenteric arteries and ischemic colitis is not excluded. Moderate-sized hiatal hernia. Small LEFT inguinal hernia containing fat. 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 Tiny nonobstructing LEFT renal calculus. Aortic Atherosclerosis (ICD10-I70.0). Electronically  Signed   By: Lavonia Dana M.D.   On: 07/11/2019 17:23      Subjective: She had a headache this morning but this is improving. Denies diarrhea, nausea, or vomiting.   Discharge Exam: Vitals:   07/13/19 1349 07/13/19 2104  BP: 133/74 133/70  Pulse: 82 84  Resp: 18 18  Temp: 97.8 F (36.6 C) 98.3 F (36.8 C)  SpO2: 100% 96%   Vitals:   07/12/19 2112 07/13/19 0551 07/13/19 1349 07/13/19 2104  BP: 138/80 117/72 133/74 133/70  Pulse: 89 85 82 84  Resp: 18 18 18 18   Temp: 98.4 F (36.9 C) 98.3 F (36.8 C) 97.8 F (36.6 C) 98.3 F (36.8 C)  TempSrc: Oral Oral Oral Oral  SpO2: 95% 96% 100% 96%  Weight:      Height:        General: Pt is alert, awake, not in acute distress Cardiovascular: RRR Respiratory: No respiratory distress, no wheezing Abdominal: Soft, NT, ND Extremities: no edema, no cyanosis    The results of significant diagnostics from this hospitalization (including imaging, microbiology, ancillary and laboratory) are listed below for reference.     Microbiology: Recent Results (from the past 240 hour(s))  SARS CORONAVIRUS 2 (TAT 6-24 HRS) Nasopharyngeal Nasopharyngeal Swab     Status: None   Collection Time: 07/11/19  6:14 PM   Specimen: Nasopharyngeal Swab  Result Value Ref Range Status   SARS Coronavirus 2 NEGATIVE NEGATIVE Final    Comment: (NOTE) SARS-CoV-2 target nucleic acids are NOT DETECTED. The SARS-CoV-2 RNA is generally detectable in upper and lower respiratory specimens during the acute phase of infection. Negative results do not preclude SARS-CoV-2 infection, do not rule out co-infections with other pathogens, and should not be used as the sole basis for treatment or other patient management decisions. Negative results must be combined with clinical observations, patient history, and epidemiological information. The expected result is Negative. Fact Sheet for Patients: SugarRoll.be Fact Sheet for Healthcare  Providers: https://www.woods-mathews.com/ This test is not yet approved or cleared by the [place] FDA and  has been authorized for detection and/or diagnosis of SARS-CoV-2 by FDA under an Emergency Use Authorization (EUA). This EUA will remain  in effect (meaning this test can be used) for the duration of the COVID-19 declaration under Section 56 4(b)(1) of the Act, 21 U.S.C. section 360bbb-3(b)(1), unless the authorization is terminated or revoked sooner. Performed at [facility], [place] [address], [place], [place] [address]      Labs: BNP (last 3 results) Recent Labs    04/06/19 1528  BNP 154.0*   Basic Metabolic Panel: Recent Labs  Lab 07/11/19 1209 07/12/19 0455 07/14/19 0522  NA 142 139 140  K 3.6 3.8 3.8  CL 107 110 111  CO2 21* 20* 20*  GLUCOSE 101* 98 108*  BUN 20 13 13   CREATININE 0.97 0.89 0.97  CALCIUM 9.3 8.8* 8.8*  MG 2.3  --   --  PHOS 3.5  --   --    Liver Function Tests: Recent Labs  Lab 07/11/19 1209 07/14/19 0522  AST 19 17  ALT 17 11  ALKPHOS 82 59  BILITOT 0.6 0.5  PROT 7.7 6.1*  ALBUMIN 4.4 3.4*   Recent Labs  Lab 07/11/19 1209  LIPASE 18   No results for input(s): AMMONIA in the last 168 hours. CBC: Recent Labs  Lab 07/11/19 1209 07/12/19 0455  WBC 14.3* 10.7*  HGB 13.8 12.4  HCT 44.1 38.3  MCV 91.5 90.8  PLT 370 255   Cardiac Enzymes: No results for input(s): CKTOTAL, CKMB, CKMBINDEX, TROPONINI in the last 168 hours. BNP: Invalid input(s): POCBNP CBG: Recent Labs  Lab 07/13/19 1157 07/13/19 1548 07/13/19 2146 07/14/19 0727 07/14/19 1154  GLUCAP 154* 71 161* 99 100*   D-Dimer No results for input(s): DDIMER in the last 72 hours. Hgb A1c No results for input(s): HGBA1C in the last 72 hours. Lipid Profile No results for input(s): CHOL, HDL, LDLCALC, TRIG, CHOLHDL, LDLDIRECT in the last 72 hours. Thyroid function studies No results for input(s): TSH, T4TOTAL, T3FREE, THYROIDAB in the last  72 hours.  Invalid input(s): FREET3 Anemia work up No results for input(s): VITAMINB12, FOLATE, FERRITIN, TIBC, IRON, RETICCTPCT in the last 72 hours. Urinalysis    Component Value Date/Time   COLORURINE YELLOW 07/11/2019 1209   APPEARANCEUR CLEAR 07/11/2019 1209   LABSPEC 1.018 07/11/2019 1209   PHURINE 6.0 07/11/2019 1209   GLUCOSEU NEGATIVE 07/11/2019 1209   HGBUR NEGATIVE 07/11/2019 1209   BILIRUBINUR NEGATIVE 07/11/2019 1209   KETONESUR NEGATIVE 07/11/2019 1209   PROTEINUR 30 (A) 07/11/2019 1209   UROBILINOGEN 0.2 09/01/2014 0300   NITRITE NEGATIVE 07/11/2019 1209   LEUKOCYTESUR NEGATIVE 07/11/2019 1209   Sepsis Labs Invalid input(s): PROCALCITONIN,  WBC,  LACTICIDVEN Microbiology Recent Results (from the past 240 hour(s))  SARS CORONAVIRUS 2 (TAT 6-24 HRS) Nasopharyngeal Nasopharyngeal Swab     Status: None   Collection Time: 07/11/19  6:14 PM   Specimen: Nasopharyngeal Swab  Result Value Ref Range Status   SARS Coronavirus 2 NEGATIVE NEGATIVE Final    Comment: (NOTE) SARS-CoV-2 target nucleic acids are NOT DETECTED. The SARS-CoV-2 RNA is generally detectable in upper and lower respiratory specimens during the acute phase of infection. Negative results do not preclude SARS-CoV-2 infection, do not rule out co-infections with other pathogens, and should not be used as the sole basis for treatment or other patient management decisions. Negative results must be combined with clinical observations, patient history, and epidemiological information. The expected result is Negative. Fact Sheet for Patients: SugarRoll.be Fact Sheet for Healthcare Providers: https://www.woods-mathews.com/ This test is not yet approved or cleared by the [place] FDA and  has been authorized for detection and/or diagnosis of SARS-CoV-2 by FDA under an Emergency Use Authorization (EUA). This EUA will remain  in effect (meaning this test can be used)  for the duration of the COVID-19 declaration under Section 56 4(b)(1) of the Act, 21 U.S.C. section 360bbb-3(b)(1), unless the authorization is terminated or revoked sooner. Performed at [facility], [place] [address], [place], [place] [address]      Time coordinating discharge: Over 32 minutes  SIGNED:   Blain Pais, MD  [facility] 07/14/2019, 12:53 PM   If 7PM-7AM, please contact night-coverage [email or web address] Password [email or web address]

## 2019-07-14 NOTE — Progress Notes (Signed)
Patient eager to go home today, complains of headache. Paged hospitalist for Tylenol order. Awaiting new orders.

## 2019-07-15 ENCOUNTER — Ambulatory Visit: Payer: Self-pay | Admitting: Family Medicine

## 2019-07-15 ENCOUNTER — Telehealth: Payer: Self-pay

## 2019-07-15 NOTE — Telephone Encounter (Signed)
Transition Care Management Follow-up Telephone Call  Date of discharge and from where: 07/14/2019 [facility]  How have you been since you were released from the hospital? Fine  Any questions or concerns?No  Items Reviewed:  Did the pt receive and understand the discharge instructions provided? Yes  Medications obtained and verified? Yes  Any new allergies since your discharge? No  Dietary orders reviewed? yes  Do you have support at home? Yes  Other (ie: DME, Home Health, etc) N/A  Functional Questionnaire: (I = Independent and D = Dependent) ADL- I  Bathing/Dressing- I   Meal Prep-  I  Eating- I  Maintaining continence- I  Transferring/Ambulation- I  Managing Meds- I   Follow up appointments reviewed:    PCP Hospital f/u appt confirmed? Yes   Scheduled to see Dr.Sanders 07/18/2019  Specialist Hospital f/u appt confirmed? GI 07/26/2019  Are transportation arrangements needed? No  If their condition worsens, is the pt aware to call  their PCP or go to the ED?Yes  Was the patient provided with contact information for the PCP's office or ED? Yes  Was the pt encouraged to call back with questions or concerns? Yes

## 2019-07-16 ENCOUNTER — Encounter (HOSPITAL_COMMUNITY): Payer: Self-pay | Admitting: Emergency Medicine

## 2019-07-16 ENCOUNTER — Emergency Department (HOSPITAL_COMMUNITY)
Admission: EM | Admit: 2019-07-16 | Discharge: 2019-07-16 | Disposition: A | Discharge status: Home / Self Care | Payer: Medicare HMO | Attending: Emergency Medicine | Admitting: Emergency Medicine

## 2019-07-16 ENCOUNTER — Other Ambulatory Visit: Payer: Self-pay

## 2019-07-16 DIAGNOSIS — E119 Type 2 diabetes mellitus without complications: Secondary | ICD-10-CM | POA: Diagnosis not present

## 2019-07-16 DIAGNOSIS — R112 Nausea with vomiting, unspecified: Secondary | ICD-10-CM | POA: Diagnosis not present

## 2019-07-16 DIAGNOSIS — K529 Noninfective gastroenteritis and colitis, unspecified: Secondary | ICD-10-CM | POA: Diagnosis not present

## 2019-07-16 DIAGNOSIS — Z79899 Other long term (current) drug therapy: Secondary | ICD-10-CM | POA: Insufficient documentation

## 2019-07-16 DIAGNOSIS — Z7982 Long term (current) use of aspirin: Secondary | ICD-10-CM | POA: Diagnosis not present

## 2019-07-16 DIAGNOSIS — I10 Essential (primary) hypertension: Secondary | ICD-10-CM | POA: Insufficient documentation

## 2019-07-16 LAB — COMPREHENSIVE METABOLIC PANEL
ALT: 31 U/L (ref 0–44)
AST: 48 U/L — ABNORMAL HIGH (ref 15–41)
Albumin: 4.3 g/dL (ref 3.5–5.0)
Alkaline Phosphatase: 78 U/L (ref 38–126)
Anion gap: 14 (ref 5–15)
BUN: 17 mg/dL (ref 8–23)
CO2: 22 mmol/L (ref 22–32)
Calcium: 9.3 mg/dL (ref 8.9–10.3)
Chloride: 107 mmol/L (ref 98–111)
Creatinine, Ser: 1.23 mg/dL — ABNORMAL HIGH (ref 0.44–1.00)
GFR calc Af Amer: 47 mL/min — ABNORMAL LOW (ref >60)
GFR calc non Af Amer: 41 mL/min — ABNORMAL LOW (ref >60)
Glucose, Bld: 138 mg/dL — ABNORMAL HIGH (ref 70–99)
Potassium: 3.3 mmol/L — ABNORMAL LOW (ref 3.5–5.1)
Sodium: 143 mmol/L (ref 135–145)
Total Bilirubin: 0.6 mg/dL (ref 0.3–1.2)
Total Protein: 7.7 g/dL (ref 6.5–8.1)

## 2019-07-16 LAB — LIPASE, BLOOD: Lipase: 17 U/L (ref 11–51)

## 2019-07-16 LAB — CBC
HCT: 43 % (ref 36.0–46.0)
Hemoglobin: 14.1 g/dL (ref 12.0–15.0)
MCH: 29.3 pg (ref 26.0–34.0)
MCHC: 32.8 g/dL (ref 30.0–36.0)
MCV: 89.4 fL (ref 80.0–100.0)
Platelets: 409 10*3/uL — ABNORMAL HIGH (ref 150–400)
RBC: 4.81 MIL/uL (ref 3.87–5.11)
RDW: 14.6 % (ref 11.5–15.5)
WBC: 9.7 10*3/uL (ref 4.0–10.5)
nRBC: 0 % (ref 0.0–0.2)

## 2019-07-16 MED ORDER — ONDANSETRON HCL 8 MG PO TABS: 8.0000 mg | ORAL_TABLET | Freq: Three times a day (TID) | ORAL | 0 refills | Status: DC | PRN

## 2019-07-16 MED ORDER — SODIUM CHLORIDE 0.9 % IV BOLUS
1000.0000 mL | Freq: Once | INTRAVENOUS | Status: AC
  Administered 2019-07-16: 1000 mL via INTRAVENOUS

## 2019-07-16 MED ORDER — SODIUM CHLORIDE 0.9% FLUSH
3.0000 mL | Freq: Once | INTRAVENOUS | Status: AC
  Administered 2019-07-16: 3 mL via INTRAVENOUS

## 2019-07-16 MED ORDER — ACETAMINOPHEN 325 MG PO TABS
650.0000 mg | ORAL_TABLET | Freq: Once | ORAL | Status: AC
  Administered 2019-07-16: 650 mg via ORAL
  Filled 2019-07-16: qty 2

## 2019-07-16 MED ORDER — ONDANSETRON 8 MG PO TBDP
8.0000 mg | ORAL_TABLET | Freq: Once | ORAL | Status: AC
  Administered 2019-07-16: 8 mg via ORAL
  Filled 2019-07-16: qty 1

## 2019-07-16 NOTE — Discharge Instructions (Addendum)
Start with clear liquids then advance to regular foods after a day or so.  Continue taking your antibiotics as directed.  Call your doctor if needed for problems.

## 2019-07-16 NOTE — ED Notes (Signed)
Patient given water

## 2019-07-16 NOTE — ED Provider Notes (Signed)
[facility] DEPT Provider Note   CSN: [id number] Arrival date & time: 07/16/19  1626     History Chief Complaint  Patient presents with  . Emesis  . Diarrhea  . Abdominal Pain  . Headache    Anna Hoffman is a 84 y.o. female.  HPI    She presents for evaluation of abdominal pain, vomiting and diarrhea.  Discharge from the hospital, 3 days ago after admission for possible ischemic colitis.  She was ultimately diagnosed with infectious colitis, and chronic intermittent diarrhea.  She was discharged on low residual diet, Cipro and Flagyl.  She is due for outpatient colonoscopy in 4 to 6 weeks.  She states that she frequently vomits anything she puts in her mouth including liquids, solids and medications.  She does not have any antiemetics, currently.  She denies fever, chills, cough, shortness of breath.  There are no other known modifying factors.   Past Medical History:  Diagnosis Date  . Arthritis   . Diabetes mellitus without complication ([place])   . Hypertension   . Hypokalemia   . Pneumonia   . Shingles   . Stroke [facility])     Patient Active Problem List   Diagnosis Date Noted  . Colitis 07/11/2019  . Chronic bronchitis ([place]) 06/15/2019  . Loose stools 06/07/2019  . Herpes zoster without complication 16/96/7893  . Other long term (current) drug therapy 08/12/2018  . Chronic renal disease, stage III 07/19/2018  . Hypertensive nephropathy 07/19/2018  . Community acquired pneumonia of right upper lobe of lung 11/07/2017  . Hypertensive emergency 07/01/2016  . Thalamic hemorrhage ([place]) 07/01/2016  . 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 Benign essential HTN   . Type 2 diabetes mellitus with stage 3 chronic kidney disease, without long-term current use of insulin ([place])   . Mixed hyperlipidemia   . ICH (intracerebral hemorrhage) ([place]) - hypertensive R thalamic hemorrhage  06/27/2016  . 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  Retroperitoneal bleed 02/08/2016  . Generalized abdominal pain   . Gastroenteritis 02/04/2016  . Diarrhea 02/04/2016  . Hypokalemia 02/04/2016  . Incarcerated paraesophageal hernia 09/02/2014  . Renal mass, left 09/02/2014  . Acute esophagitis 09/02/2014  . Gastric outlet obstruction 09/02/2014  . Hypertension   . Vomiting 09/01/2014    Past Surgical History:  Procedure Laterality Date  . ABDOMINAL HYSTERECTOMY    . BREAST EXCISIONAL BIOPSY Left   . ESOPHAGOGASTRODUODENOSCOPY N/A 09/01/2014   Procedure: ESOPHAGOGASTRODUODENOSCOPY (EGD);  Surgeon: Lear Ng, MD;  Location: [facility];  Service: Endoscopy;  Laterality: N/A;  . HIATAL HERNIA REPAIR N/A 09/04/2014   Procedure: LAPAROSCOPIC REPAIR OF HIATAL HERNIA;  Surgeon: Excell Seltzer, MD;  Location: [facility];  Service: General;  Laterality: N/A;  With MESH  . IR GENERIC HISTORICAL  04/10/2016   IR US GUIDE VASC ACCESS RIGHT 04/10/2016 Corrie Mckusick, DO [facility]  . IR GENERIC HISTORICAL  04/10/2016   IR ANGIOGRAM SELECTIVE EACH ADDITIONAL VESSEL 04/10/2016 Corrie Mckusick, DO [facility]  . IR GENERIC HISTORICAL  04/10/2016   IR ANGIOGRAM SELECTIVE EACH ADDITIONAL VESSEL 04/10/2016 Corrie Mckusick, DO [facility]  . IR GENERIC HISTORICAL  04/10/2016   IR EMBO TUMOR ORGAN ISCHEMIA INFARCT INC GUIDE ROADMAPPING 04/10/2016 Corrie Mckusick, DO [facility]  . IR GENERIC HISTORICAL  04/10/2016   IR ANGIOGRAM SELECTIVE EACH ADDITIONAL VESSEL 04/10/2016 Corrie Mckusick, DO [facility]  . IR GENERIC HISTORICAL  04/10/2016   IR ANGIOGRAM SELECTIVE EACH ADDITIONAL VESSEL 04/10/2016  Corrie Mckusick, DO [facility]  . IR GENERIC HISTORICAL  04/10/2016   IR RENAL SELECTIVE  UNI INC S&I MOD SED 04/10/2016 Corrie Mckusick, DO [facility]  . IR GENERIC HISTORICAL  03/06/2016   IR RADIOLOGIST EVAL & MGMT 03/06/2016 Corrie Mckusick, DO [facility]  . IR GENERIC HISTORICAL  03/26/2016   IR RADIOLOGIST EVAL & MGMT 03/26/2016 Corrie Mckusick, DO [facility]  . IR  GENERIC HISTORICAL  04/29/2016   IR RADIOLOGIST EVAL & MGMT 04/29/2016 [facility]  . IR RADIOLOGIST EVAL & MGMT  11/12/2016  . IR RADIOLOGIST EVAL & MGMT  12/16/2017  . KNEE SURGERY    . TONSILLECTOMY       OB History   No obstetric history on file.     Family History  Problem Relation Age of Onset  . Alzheimer's disease Mother   . Prostate cancer Father   . Brain cancer Brother   . Brain cancer Brother   . Pancreatic cancer Sister   . Allergic rhinitis Neg Hx   . Asthma Neg Hx   . Eczema Neg Hx   . Urticaria Neg Hx     Social History   Tobacco Use  . Smoking status: Never Smoker  . Smokeless tobacco: Never Used  Substance Use Topics  . Alcohol use: No  . Drug use: No    Home Medications Prior to Admission medications   Medication Sig Start Date End Date Taking? Authorizing Provider  acetaminophen (TYLENOL) 325 MG tablet Take 2 tablets (650 mg total) by mouth every 6 (six) hours as needed for mild pain (or Fever >/= 101). 02/08/16   Johnson, Clanford L, MD  albuterol (VENTOLIN HFA) 108 (90 Base) MCG/ACT inhaler Inhale 1-2 puffs into the lungs every 4 (four) hours as needed for wheezing or shortness of breath. 06/15/19   Kennith Gain, MD  amLODipine (NORVASC) 10 MG tablet TAKE 1 TABLET BY MOUTH ONCE DAILY Patient taking differently: Take 10 mg by mouth daily.  10/29/18   Glendale Chard, MD  Ascorbic Acid (VITAMIN C) 1000 MG tablet Take 1,000 mg by mouth daily.    [provider]  aspirin EC 81 MG tablet Take 81 mg by mouth daily.    [provider]  azelastine (ASTELIN) 0.1 % nasal spray Place 2 sprays into both nostrils 2 (two) times daily. Use in each nostril as directed 10/06/18   Kennith Gain, MD  Carboxymethylcellul-Glycerin (LUBRICATING EYE DROPS OP) Apply 1 drop to eye daily as needed (dry eyes).     [provider]  ciprofloxacin (CIPRO) 500 MG tablet Take 1 tablet (500 mg total) by mouth 2 (two) times daily.  07/14/19   Blain Pais, MD  dicyclomine (BENTYL) 10 MG capsule Take 1 capsule (10 mg total) by mouth 2 (two) times daily. 06/06/19   Zehr, Laban Emperor, PA-C  Fexofenadine HCl (ALLEGRA PO) Take 1 capsule by mouth daily.     [provider]  glucose blood (ONETOUCH VERIO) test strip 1 each by Other route as needed for other. Use as instructed to check blood pressure 2 times per day dx: e11.65 10/25/18   Glendale Chard, MD  hydrochlorothiazide (MICROZIDE) 12.5 MG capsule Take 1 capsule (12.5 mg total) by mouth daily. Patient taking differently: Take 12.5 mg by mouth daily as needed (fluid).  04/08/19 04/07/20  Glendale Chard, MD  irbesartan (AVAPRO) 150 MG tablet TAKE 1 TABLET BY MOUTH EVERY DAY. 03/17/19   Glendale Chard, MD  loperamide (IMODIUM) 2 MG capsule Take 1 capsule (2 mg total) by mouth 4 (four) times daily as needed for diarrhea or loose stools. 02/13/19   Couture, Cortni S, PA-C  metroNIDAZOLE (FLAGYL) 500 MG tablet Take 1 tablet (500 mg total) by mouth 3 (three) times daily. 07/14/19   Blain Pais, MD  Multiple Vitamins-Minerals (STRESS TAB NF PO) Take 1 tablet by mouth daily.    [provider]  olopatadine (PATANOL) 0.1 % ophthalmic solution Place 1 drop into both eyes 2 (two) times daily. 06/15/19   Kennith Gain, MD  omeprazole (PRILOSEC) 20 MG capsule TAKE 1 CAPSULE BY MOUTH ONCE DAILY 30 MINUTES TO 1 HOUR BEFORE A MEAL Patient taking differently: Take 20 mg by mouth daily.  10/29/18   Glendale Chard, MD  ondansetron (ZOFRAN) 8 MG tablet Take 1 tablet (8 mg total) by mouth every 8 (eight) hours as needed for nausea or vomiting. 07/16/19   Daleen Bo, MD  rosuvastatin (CRESTOR) 20 MG tablet TAKE 1 TABLET BY MOUTH ONCE DAILY Patient taking differently: Take 20 mg by mouth daily.  10/29/18   Glendale Chard, MD  trolamine salicylate (ASPERCREME) 10 % cream Apply 1 application topically as needed for muscle pain.    [provider]  vitamin  B-12 (CYANOCOBALAMIN) 1000 MCG tablet Take 1,000 mcg by mouth daily.    [provider]    Allergies    Amoxicillin, Ampicillin, and Sulfa antibiotics  Review of Systems   Review of Systems  All other systems reviewed and are negative.   Physical Exam Updated Vital Signs BP (!) 151/86 (BP Location: Right Arm)   Pulse 82   Temp 98.3 F (36.8 C) (Oral)   Resp 16   LMP  (LMP Unknown)   SpO2 98%   Physical Exam Vitals and nursing note reviewed.  Constitutional:      General: She is not in acute distress.    Appearance: She is well-developed. She is not ill-appearing, toxic-appearing or diaphoretic.  HENT:     Head: Normocephalic and atraumatic.  Eyes:     Conjunctiva/sclera: Conjunctivae normal.     Pupils: Pupils are equal, round, and reactive to light.  Neck:     Trachea: Phonation normal.  Cardiovascular:     Rate and Rhythm: Normal rate and regular rhythm.  Pulmonary:     Effort: Pulmonary effort is normal.     Breath sounds: Normal breath sounds.  Chest:     Chest wall: No tenderness.  Abdominal:     General: There is no distension.     Palpations: Abdomen is soft.     Tenderness: There is no abdominal tenderness. There is no guarding.  Musculoskeletal:        General: Normal range of motion.     Cervical back: Normal range of motion and neck supple.  Skin:    General: Skin is warm and dry.  Neurological:     Mental Status: She is alert and oriented to person, place, and time.     Cranial Nerves: No cranial nerve deficit.     Sensory: No sensory deficit.     Motor: No weakness or abnormal muscle tone.  Psychiatric:        Mood and Affect: Mood normal.        Behavior: Behavior normal.        Thought Content: Thought content normal.        Judgment: Judgment normal.     ED Results / Procedures /  Treatments   Labs (all labs ordered are listed, but only abnormal results are displayed) Labs Reviewed  COMPREHENSIVE METABOLIC PANEL - Abnormal;  Notable for the following components:      Result Value   Potassium 3.3 (*)    Glucose, Bld 138 (*)    Creatinine, Ser 1.23 (*)    AST 48 (*)    GFR calc non Af Amer 41 (*)    GFR calc Af Amer 47 (*)    All other components within normal limits  CBC - Abnormal; Notable for the following components:   Platelets 409 (*)    All other components within normal limits  LIPASE, BLOOD  URINALYSIS, ROUTINE W REFLEX MICROSCOPIC    EKG None  Radiology No results found.  Procedures Procedures (including critical care time)  Medications Ordered in ED Medications  sodium chloride flush (NS) 0.9 % injection 3 mL (3 mLs Intravenous Given 07/16/19 2125)  ondansetron (ZOFRAN-ODT) disintegrating tablet 8 mg (8 mg Oral Given 07/16/19 2059)  acetaminophen (TYLENOL) tablet 650 mg (650 mg Oral Given 07/16/19 2058)  sodium chloride 0.9 % bolus 1,000 mL (0 mLs Intravenous Stopped 07/16/19 2313)    ED Course  I have reviewed the triage vital signs and the nursing notes.  Pertinent labs & imaging results that were available during my care of the patient were reviewed by me and considered in my medical decision making (see chart for details).    MDM Rules/Calculators/A&P     CHA2DS2/VAS Stroke Risk Points      N/A >= 2 Points: High Risk  1 - 1.99 Points: Medium Risk  0 Points: Low Risk    A final score could not be computed because of missing components.: Last  Change: N/A     This score determines the patient's risk of having a stroke if the  patient has atrial fibrillation.      This score is not applicable to this patient. Components are not  calculated.                    Patient Vitals for the past 24 hrs:  BP Temp Temp src Pulse Resp SpO2  07/16/19 2232 (!) 151/86 98.3 F (36.8 C) Oral 82 16 98 %  07/16/19 2017 (!) 151/78 -- -- (!) 111 19 98 %  07/16/19 1648 (!) 142/93 98.3 F (36.8 C) Oral 100 18 97 %    11:14 PM Reevaluation with update and discussion. After initial  assessment and treatment, an updated evaluation reveals vomiting.  Findings discussed with patient all questions were answered. Daleen Bo   Medical Decision Making: Colitis, under treatment with oral antibiotics, with nausea and vomiting.  Doubt bowel obstruction, serious bacterial infection or impending vascular collapse.  Patient improved after treatment in the ED and stable for discharge with antiemetic prescription.  CRITICAL CARE-no Performed by: Daleen Bo   Nursing Notes Reviewed/ Care Coordinated Applicable Imaging Reviewed Interpretation of Laboratory Data incorporated into ED treatment  The patient appears reasonably screened and/or stabilized for discharge and I doubt any other medical condition or other [facility] requiring further screening, evaluation, or treatment in the ED at this time prior to discharge.  Plan: Home Medications-continue usual; Home Treatments-gradual advance diet; return here if the recommended treatment, does not improve the symptoms; Recommended follow up-PCP and GI, as needed.   Final Clinical Impression(s) / ED Diagnoses Final diagnoses:  Non-intractable vomiting with nausea, unspecified vomiting type  Colitis    Rx / DC Orders  ED Discharge Orders         Ordered    ondansetron (ZOFRAN) 8 MG tablet  Every 8 hours PRN     07/16/19 2313           Daleen Bo, MD 07/16/19 2315

## 2019-07-16 NOTE — ED Triage Notes (Signed)
Pt reports that she was discharged on Thursday from here for colitis. Reports when takes antibiotics vomits back up. Reports still having abd pains and loose stools.

## 2019-07-16 NOTE — ED Notes (Signed)
Unsuccessful IV attempt x2. Raquel Sarna RN asked to attempt.

## 2019-07-18 ENCOUNTER — Encounter: Payer: Self-pay | Admitting: Internal Medicine

## 2019-07-18 ENCOUNTER — Ambulatory Visit (INDEPENDENT_AMBULATORY_CARE_PROVIDER_SITE_OTHER): Payer: Medicare HMO | Admitting: Internal Medicine

## 2019-07-18 ENCOUNTER — Other Ambulatory Visit: Payer: Self-pay

## 2019-07-18 VITALS — Ht 65.5 in

## 2019-07-18 DIAGNOSIS — R112 Nausea with vomiting, unspecified: Secondary | ICD-10-CM

## 2019-07-18 DIAGNOSIS — A09 Infectious gastroenteritis and colitis, unspecified: Secondary | ICD-10-CM

## 2019-07-18 DIAGNOSIS — Z09 Encounter for follow-up examination after completed treatment for conditions other than malignant neoplasm: Secondary | ICD-10-CM

## 2019-07-18 DIAGNOSIS — Z8719 Personal history of other diseases of the digestive system: Secondary | ICD-10-CM | POA: Diagnosis not present

## 2019-07-18 NOTE — Patient Instructions (Signed)
Colitis  Colitis is inflammation of the colon. Colitis may last a short time (be acute), or it may last a long time (become chronic). What are the causes? This condition may be caused by:  Viruses.  Bacteria.  Reaction to medicine.  Certain autoimmune diseases such as Crohn's disease or ulcerative colitis.  Radiation treatment.  Decreased blood flow to the bowel (ischemia). What are the signs or symptoms? Symptoms of this condition include:  Watery diarrhea.  Passing bloody or tarry stool.  Pain.  Fever.  Vomiting.  Tiredness (fatigue).  Weight loss.  Bloating.  Abdominal pain.  Having fewer bowel movements than usual.  A strong and sudden urge to have a bowel movement.  Feeling like the bowel is not empty after a bowel movement. How is this diagnosed? This condition is diagnosed with a stool test or a blood test. You may also have other tests, such as:  X-rays.  CT scan.  Colonoscopy.  Endoscopy.  Biopsy. How is this treated? Treatment for this condition depends on the cause. The condition may be treated by:  Resting the bowel. This involves not eating or drinking for a period of time.  Fluids that are given through an IV.  Medicine for pain and diarrhea.  Antibiotic medicines.  Cortisone medicines.  Surgery. Follow these instructions at home: Eating and drinking   Follow instructions from your health care provider about eating or drinking restrictions.  Drink enough fluid to keep your urine pale yellow.  Work with a dietitian to determine which foods cause your condition to flare up.  Avoid foods that cause flare-ups.  Eat a well-balanced diet. General instructions  If you were prescribed an antibiotic medicine, take it as told by your health care provider. Do not stop taking the antibiotic even if you start to feel better.  Take over-the-counter and prescription medicines only as told by your health care provider.  Keep all  follow-up visits as told by your health care provider. This is important. Contact a health care provider if:  Your symptoms do not go away.  You develop new symptoms. Get help right away if you:  Have a fever that does not go away with treatment.  Develop chills.  Have extreme weakness, fainting, or dehydration.  Have repeated vomiting.  Develop severe pain in your abdomen.  Pass bloody or tarry stool. Summary  Colitis is inflammation of the colon. Colitis may last a short time (be acute), or it may last a long time (become chronic).  Treatment for this condition depends on the cause and may include resting the bowel, taking medicines, or having surgery.  If you were prescribed an antibiotic medicine, take it as told by your health care provider. Do not stop taking the antibiotic even if you start to feel better.  Get help right away if you develop severe pain in your abdomen.  Keep all follow-up visits as told by your health care provider. This is important. This information is not intended to replace advice given to you by your health care provider. Make sure you discuss any questions you have with your health care provider. Document Released: 08/28/2004 Document Revised: 01/21/2018 Document Reviewed: 01/21/2018 Elsevier Patient Education  2020 [employer]  

## 2019-07-25 ENCOUNTER — Other Ambulatory Visit: Payer: Self-pay

## 2019-07-25 ENCOUNTER — Other Ambulatory Visit: Payer: Medicare HMO

## 2019-07-25 DIAGNOSIS — A09 Infectious gastroenteritis and colitis, unspecified: Secondary | ICD-10-CM

## 2019-07-25 DIAGNOSIS — R112 Nausea with vomiting, unspecified: Secondary | ICD-10-CM

## 2019-07-26 ENCOUNTER — Encounter: Payer: Self-pay | Admitting: General Surgery

## 2019-07-26 ENCOUNTER — Ambulatory Visit (INDEPENDENT_AMBULATORY_CARE_PROVIDER_SITE_OTHER): Payer: Medicare HMO | Admitting: Gastroenterology

## 2019-07-26 ENCOUNTER — Encounter: Payer: Self-pay | Admitting: Gastroenterology

## 2019-07-26 VITALS — BP 130/80 | HR 96 | Temp 98.2°F | Ht 63.0 in | Wt 144.1 lb

## 2019-07-26 DIAGNOSIS — R9389 Abnormal findings on diagnostic imaging of other specified body structures: Secondary | ICD-10-CM

## 2019-07-26 DIAGNOSIS — Z1159 Encounter for screening for other viral diseases: Secondary | ICD-10-CM

## 2019-07-26 DIAGNOSIS — R197 Diarrhea, unspecified: Secondary | ICD-10-CM | POA: Diagnosis not present

## 2019-07-26 LAB — BMP8+EGFR
BUN/Creatinine Ratio: 15 (ref 12–28)
BUN: 16 mg/dL (ref 8–27)
CO2: 20 mmol/L (ref 20–29)
Calcium: 9.6 mg/dL (ref 8.7–10.3)
Chloride: 102 mmol/L (ref 96–106)
Creatinine, Ser: 1.05 mg/dL — ABNORMAL HIGH (ref 0.57–1.00)
GFR calc Af Amer: 57 mL/min/{1.73_m2} — ABNORMAL LOW (ref >59)
GFR calc non Af Amer: 50 mL/min/{1.73_m2} — ABNORMAL LOW (ref >59)
Glucose: 99 mg/dL (ref 65–99)
Potassium: 3.7 mmol/L (ref 3.5–5.2)
Sodium: 143 mmol/L (ref 134–144)

## 2019-07-26 LAB — CBC WITH DIFFERENTIAL/PLATELET
Basophils Absolute: 0.1 10*3/uL (ref 0.0–0.2)
Basos: 2 %
EOS (ABSOLUTE): 0.3 10*3/uL (ref 0.0–0.4)
Eos: 5 %
Hematocrit: 38.9 % (ref 34.0–46.6)
Hemoglobin: 12.6 g/dL (ref 11.1–15.9)
Immature Grans (Abs): 0 10*3/uL (ref 0.0–0.1)
Immature Granulocytes: 0 %
Lymphocytes Absolute: 2.1 10*3/uL (ref 0.7–3.1)
Lymphs: 28 %
MCH: 28.1 pg (ref 26.6–33.0)
MCHC: 32.4 g/dL (ref 31.5–35.7)
MCV: 87 fL (ref 79–97)
Monocytes Absolute: 0.5 10*3/uL (ref 0.1–0.9)
Monocytes: 7 %
Neutrophils Absolute: 4.4 10*3/uL (ref 1.4–7.0)
Neutrophils: 58 %
Platelets: 464 10*3/uL — ABNORMAL HIGH (ref 150–450)
RBC: 4.49 x10E6/uL (ref 3.77–5.28)
RDW: 14.5 % (ref 11.7–15.4)
WBC: 7.5 10*3/uL (ref 3.4–10.8)

## 2019-07-26 MED ORDER — NA SULFATE-K SULFATE-MG SULF 17.5-3.13-1.6 GM/177ML PO SOLN: 1.0000 | Freq: Once | ORAL | 0 refills | Status: AC

## 2019-07-26 MED FILL — SUPREP BOWEL PREP KIT: 17.5-3.13-1 | 1 days supply | Qty: 354 | Fill #0

## 2019-07-26 NOTE — Progress Notes (Signed)
07/26/2019 Anna Hoffman [id number] 08/30/1934   HISTORY OF PRESENT ILLNESS: This is a pleasant 84 year old female who I have seen a couple of times in the office for complaints of intermittent abdominal pain and loose stools.  Please see my office notes from November 2 and November 24 for further details.  Nonetheless, her symptoms seem to be related to certain things that she eats and I also thought there is probably a component of IBS related to stress and anxiety.  She ended up in the ER due to her symptoms earlier this month, however.  CT scan of the abdomen pelvis with contrast showed the following   IMPRESSION: Sigmoid diverticulosis.  Wall thickening of the sigmoid colon, to lesser degree distal transverse and proximal descending colon, raising question of colitis ; differential diagnosis includes infection and inflammatory bowel disease, though patient does have significant atherosclerotic disease changes including at the origins of the celiac and superior mesenteric arteries and ischemic colitis is not excluded.  Moderate-sized hiatal hernia.  Small LEFT inguinal hernia containing fat.  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  Tiny nonobstructing LEFT renal calculus.  Aortic Atherosclerosis (ICD10-I70.0).  She was seen by our service as an inpatient, but her symptoms had quickly resolved.  She had been treated with some antibiotics due to elevated white blood cell count.  She is here for hospital follow-up.  They recommended colonoscopy in 4 to 6 weeks.  She tells me she could not complete the oral antibiotics after discharge because they made her sick.  But she says that she has been feeling fine as she has been watching what she eats.  She says that her appetite is great, she feels hungry all the time.  Last colonoscopy was 2010.  Past Medical History:  Diagnosis Date  . Arthritis   . Diabetes mellitus without complication ([place])   . Hypertension    . Hypokalemia   . Pneumonia   . Shingles   . Stroke [facility])    Past Surgical History:  Procedure Laterality Date  . ABDOMINAL HYSTERECTOMY    . BREAST EXCISIONAL BIOPSY Left   . ESOPHAGOGASTRODUODENOSCOPY N/A 09/01/2014   Procedure: ESOPHAGOGASTRODUODENOSCOPY (EGD);  Surgeon: Lear Ng, MD;  Location: [facility];  Service: Endoscopy;  Laterality: N/A;  . HIATAL HERNIA REPAIR N/A 09/04/2014   Procedure: LAPAROSCOPIC REPAIR OF HIATAL HERNIA;  Surgeon: Excell Seltzer, MD;  Location: [facility];  Service: General;  Laterality: N/A;  With MESH  . IR GENERIC HISTORICAL  04/10/2016   IR US GUIDE VASC ACCESS RIGHT 04/10/2016 Corrie Mckusick, DO [facility]  . IR GENERIC HISTORICAL  04/10/2016   IR ANGIOGRAM SELECTIVE EACH ADDITIONAL VESSEL 04/10/2016 Corrie Mckusick, DO [facility]  . IR GENERIC HISTORICAL  04/10/2016   IR ANGIOGRAM SELECTIVE EACH ADDITIONAL VESSEL 04/10/2016 Corrie Mckusick, DO [facility]  . IR GENERIC HISTORICAL  04/10/2016   IR EMBO TUMOR ORGAN ISCHEMIA INFARCT INC GUIDE ROADMAPPING 04/10/2016 Corrie Mckusick, DO [facility]  . IR GENERIC HISTORICAL  04/10/2016   IR ANGIOGRAM SELECTIVE EACH ADDITIONAL VESSEL 04/10/2016 Corrie Mckusick, DO [facility]  . IR GENERIC HISTORICAL  04/10/2016   IR ANGIOGRAM SELECTIVE EACH ADDITIONAL VESSEL 04/10/2016 Corrie Mckusick, DO [facility]  . IR GENERIC HISTORICAL  04/10/2016   IR RENAL SELECTIVE  UNI INC S&I MOD SED 04/10/2016 Corrie Mckusick, DO [facility]  . IR GENERIC HISTORICAL  03/06/2016   IR RADIOLOGIST EVAL & MGMT 03/06/2016 Corrie Mckusick, DO [facility]  .  IR GENERIC HISTORICAL  03/26/2016   IR RADIOLOGIST EVAL & MGMT 03/26/2016 Corrie Mckusick, DO [facility]  . IR GENERIC HISTORICAL  04/29/2016   IR RADIOLOGIST EVAL & MGMT 04/29/2016 [facility]  . IR RADIOLOGIST EVAL & MGMT  11/12/2016  . IR RADIOLOGIST EVAL & MGMT  12/16/2017  . KNEE SURGERY    . TONSILLECTOMY      reports that she has never smoked. She has never used smokeless tobacco.  She reports that she does not drink alcohol or use drugs. family history includes Alzheimer's disease in her mother; Brain cancer in her brother and brother; Pancreatic cancer in her sister; Prostate cancer in her father. Allergies  Allergen Reactions  . Amoxicillin Diarrhea    Has patient had a PCN reaction causing immediate rash, facial/tongue/throat swelling, SOB or lightheadedness with hypotension: no Has patient had a PCN reaction causing severe rash involving mucus membranes or skin necrosis: no Has patient had a PCN reaction that required hospitalization: no pharmacist consult Has patient had a PCN reaction occurring within the last 10 years: yes If all of the above answers are "NO", then may proceed with Cephalosporin use.   . Ampicillin Rash    Has patient had a PCN reaction causing immediate rash, facial/tongue/throat swelling, SOB or lightheadedness with hypotension: No Has patient had a PCN reaction causing severe rash involving mucus membranes or skin necrosis: No Has patient had a PCN reaction that required hospitalization No Has patient had a PCN reaction occurring within the last 10 years: No If all of the above answers are "NO", then may proceed with Cephalosporin use.   . Sulfa Antibiotics Rash      Outpatient Encounter Medications as of 07/26/2019  Medication Sig  . acetaminophen (TYLENOL) 325 MG tablet Take 2 tablets (650 mg total) by mouth every 6 (six) hours as needed for mild pain (or Fever >/= 101).  Marland Kitchen albuterol (VENTOLIN HFA) 108 (90 Base) MCG/ACT inhaler Inhale 1-2 puffs into the lungs every 4 (four) hours as needed for wheezing or shortness of breath.  Marland Kitchen amLODipine (NORVASC) 10 MG tablet TAKE 1 TABLET BY MOUTH ONCE DAILY (Patient taking differently: Take 10 mg by mouth daily. )  . Ascorbic Acid (VITAMIN C) 1000 MG tablet Take 1,000 mg by mouth daily.  Marland Kitchen aspirin EC 81 MG tablet Take 81 mg by mouth daily.  Marland Kitchen azelastine (ASTELIN) 0.1 % nasal spray Place 2 sprays  into both nostrils 2 (two) times daily. Use in each nostril as directed  . Carboxymethylcellul-Glycerin (LUBRICATING EYE DROPS OP) Apply 1 drop to eye daily as needed (dry eyes).   Marland Kitchen dicyclomine (BENTYL) 10 MG capsule Take 1 capsule (10 mg total) by mouth 2 (two) times daily.  Marland Kitchen Fexofenadine HCl (ALLEGRA PO) Take 1 capsule by mouth daily.   Marland Kitchen glucose blood (ONETOUCH VERIO) test strip 1 each by Other route as needed for other. Use as instructed to check blood pressure 2 times per day dx: e11.65  . hydrochlorothiazide (MICROZIDE) 12.5 MG capsule Take 1 capsule (12.5 mg total) by mouth daily. (Patient taking differently: Take 12.5 mg by mouth daily as needed (fluid). )  . irbesartan (AVAPRO) 150 MG tablet TAKE 1 TABLET BY MOUTH EVERY DAY.  Marland Kitchen loperamide (IMODIUM) 2 MG capsule Take 1 capsule (2 mg total) by mouth 4 (four) times daily as needed for diarrhea or loose stools.  . Multiple Vitamins-Minerals (STRESS TAB NF PO) Take 1 tablet by mouth daily.  Marland Kitchen olopatadine (PATANOL) 0.1 %  ophthalmic solution Place 1 drop into both eyes 2 (two) times daily.  Marland Kitchen omeprazole (PRILOSEC) 20 MG capsule TAKE 1 CAPSULE BY MOUTH ONCE DAILY 30 MINUTES TO 1 HOUR BEFORE A MEAL (Patient taking differently: Take 20 mg by mouth daily. )  . rosuvastatin (CRESTOR) 20 MG tablet TAKE 1 TABLET BY MOUTH ONCE DAILY (Patient taking differently: Take 20 mg by mouth daily. )  . trolamine salicylate (ASPERCREME) 10 % cream Apply 1 application topically as needed for muscle pain.  . vitamin B-12 (CYANOCOBALAMIN) 1000 MCG tablet Take 1,000 mcg by mouth daily.  . Na Sulfate-K Sulfate-Mg Sulf 17.5-3.13-1.6 GM/177ML SOLN Take 1 kit by mouth once for 1 dose.  . ondansetron (ZOFRAN) 8 MG tablet Take 1 tablet (8 mg total) by mouth every 8 (eight) hours as needed for nausea or vomiting. (Patient not taking: Reported on 07/26/2019)  . [DISCONTINUED] ciprofloxacin (CIPRO) 500 MG tablet Take 1 tablet (500 mg total) by mouth 2 (two) times daily.  .  [DISCONTINUED] metroNIDAZOLE (FLAGYL) 500 MG tablet Take 1 tablet (500 mg total) by mouth 3 (three) times daily.   No facility-administered encounter medications on file as of 07/26/2019.     REVIEW OF SYSTEMS  : All other systems reviewed and negative except where noted in the History of Present Illness.   PHYSICAL EXAM: BP 130/80 (BP Location: Left Arm, Patient Position: Sitting, Cuff Size: Normal)   Pulse 96   Temp 98.2 F (36.8 C)   Ht _0  (1.6 m) Comment: height measured without shoes  Wt 144 lb 2 oz (65.4 kg)   LMP  (LMP Unknown)   BMI 25.53 kg/m  General: Well developed female in no acute distress Head: Normocephalic and atraumatic Eyes:  Sclerae anicteric, conjunctiva pink. Ears: Normal auditory acuity Lungs: Clear throughout to auscultation; no increased WOB. Heart: Regular rate and rhythm; no M/R/G. Abdomen: Soft, non-distended.  BS present.  Non-tender. Rectal:  Will be done at the time of colonoscopy. Musculoskeletal: Symmetrical with no gross deformities  Skin: No lesions on visible extremities Extremities: No edema  Neurological: Alert oriented x 4, grossly non-focal Psychological:  Alert and cooperative. Normal mood and affect  ASSESSMENT AND PLAN: *84 year old female with complaints of intermittent diarrhea/loose stools and generalized abdominal pain.  Seems to be related to certain things that she eats such as apples, leafy greens, etc.  I think she also has a component of IBS related to stress and anxiety.  Nonetheless, she was recently hospitalized for her symptoms and CT scan suggested wall thickening of the sigmoid colon, to a lesser degree distal transverse and proximal descending colon raising question of colitis whether infectious versus inflammatory versus ischemic.  She was treated with a short course of antibiotics, which she could not complete as an outpatient because they made her vomit.  She has been doing well since her hospitalization as she has  been watching closely what she eats.  It was recommended that she have a colonoscopy 4 to 6 weeks from hospitalization.  We will plan to schedule that with Dr. Ardis Hughs.  I am going to give her MiraLAX bowel prep as she already drinks Gatorade frequently at home and I think she will have a difficult time tolerating another type of prep.  **The risks, benefits, and alternatives to colonoscopy were discussed with the patient and she consents to proceed.   CC:  Glendale Chard, MD

## 2019-07-26 NOTE — Patient Instructions (Addendum)
If you are age 82 or older, your body mass index should be between 23-30. Your Body mass index is 25.53 kg/m. If this is out of the aforementioned range listed, please consider follow up with your Primary Care Provider.  If you are age 18 or younger, your body mass index should be between 19-25. Your Body mass index is 25.53 kg/m. If this is out of the aformentioned range listed, please consider follow up with your Primary Care Provider.   You have been scheduled for a colonoscopy. Please follow written instructions given to you at your visit today.  Please pick up your prep supplies at the pharmacy within the next 1-3 days. If you use inhalers (even only as needed), please bring them with you on the day of your procedure.    Thank you for choosing me and [facility]

## 2019-07-27 NOTE — Progress Notes (Signed)
I agree with the above note, plan 

## 2019-08-03 NOTE — Progress Notes (Signed)
Virtual Visit via Video   This visit type was conducted due to national recommendations for restrictions regarding the COVID-19 Pandemic (e.g. social distancing) in an effort to limit this patient's exposure and mitigate transmission in our community.  Due to her co-morbid illnesses, this patient is at least at moderate risk for complications without adequate follow up.  This format is felt to be most appropriate for this patient at this time.  All issues noted in this document were discussed and addressed.  A limited physical exam was performed with this format.    This visit type was conducted due to national recommendations for restrictions regarding the COVID-19 Pandemic (e.g. social distancing) in an effort to limit this patient's exposure and mitigate transmission in our community.  Patients identity confirmed using two different identifiers.  This format is felt to be most appropriate for this patient at this time.  All issues noted in this document were discussed and addressed.  No physical exam was performed (except for noted visual exam findings with Video Visits).    Date:  08/03/2019   ID:  Anna Hoffman, DOB Jul 08, 1935, MRN [id number]  Patient Location:  Home  Provider location:   Office    Chief Complaint:  "I was in the hospital"  History of Present Illness:    Anna Hoffman is a 84 y.o. female who presents via video conferencing for a telehealth visit today.    The patient does not have symptoms concerning for COVID-19 infection (fever, chills, cough, or new shortness of breath).   She presents today for virtual visit. She prefers this method of contact due to COVID-19 pandemic.  She presents today for hospital f/u. Unfortunately, she was not able to complete video call, so this was converted to a phone visit. (Incoming call caused audio to fail, so we then made transition to phone visit w/ her consent.   Patient presented to the ED on 12/7 with complaint of nausea,  vomiting and diarrhea.  These symptoms were associated with temp up to 100 that started 3 days PTA. She presented with nl vitals. ER w/u revealed elevated WBC, CT scan of the abdomen and pelvis showed thickening of the wall of the sigmoid colon, distal transverse and proximal descending colon worrisome for colitis. Patient was admitted for infectious colitis and started on IV ciprofloxacin and IV Flagyl.  GI consult suggested d/c pt on oral abx and schedule colonoscopy as an outpatient. She was discharged in stable condition on 12/10. Unfortunately, she went back to ER on 12/12 for further evaluation of intractable n/v. She was given iv fluids and discharged home in stable condition. Her sx have since resolved.     Past Medical History:  Diagnosis Date  . Arthritis   . Diabetes mellitus without complication ([place])   . Hypertension   . Hypokalemia   . Pneumonia   . Shingles   . Stroke [facility])    Past Surgical History:  Procedure Laterality Date  . ABDOMINAL HYSTERECTOMY    . BREAST EXCISIONAL BIOPSY Left   . ESOPHAGOGASTRODUODENOSCOPY N/A 09/01/2014   Procedure: ESOPHAGOGASTRODUODENOSCOPY (EGD);  Surgeon: Lear Ng, MD;  Location: [facility];  Service: Endoscopy;  Laterality: N/A;  . HIATAL HERNIA REPAIR N/A 09/04/2014   Procedure: LAPAROSCOPIC REPAIR OF HIATAL HERNIA;  Surgeon: Excell Seltzer, MD;  Location: [facility];  Service: General;  Laterality: N/A;  With MESH  . IR GENERIC HISTORICAL  04/10/2016   IR US GUIDE VASC ACCESS RIGHT 04/10/2016 Corrie Mckusick,  DO [facility]  . IR GENERIC HISTORICAL  04/10/2016   IR ANGIOGRAM SELECTIVE EACH ADDITIONAL VESSEL 04/10/2016 Corrie Mckusick, DO [facility]  . IR GENERIC HISTORICAL  04/10/2016   IR ANGIOGRAM SELECTIVE EACH ADDITIONAL VESSEL 04/10/2016 Corrie Mckusick, DO [facility]  . IR GENERIC HISTORICAL  04/10/2016   IR EMBO TUMOR ORGAN ISCHEMIA INFARCT INC GUIDE ROADMAPPING 04/10/2016 Corrie Mckusick, DO [facility]  . IR GENERIC HISTORICAL  04/10/2016    IR ANGIOGRAM SELECTIVE EACH ADDITIONAL VESSEL 04/10/2016 Corrie Mckusick, DO [facility]  . IR GENERIC HISTORICAL  04/10/2016   IR ANGIOGRAM SELECTIVE EACH ADDITIONAL VESSEL 04/10/2016 Corrie Mckusick, DO [facility]  . IR GENERIC HISTORICAL  04/10/2016   IR RENAL SELECTIVE  UNI INC S&I MOD SED 04/10/2016 Corrie Mckusick, DO [facility]  . IR GENERIC HISTORICAL  03/06/2016   IR RADIOLOGIST EVAL & MGMT 03/06/2016 Corrie Mckusick, DO [facility]  . IR GENERIC HISTORICAL  03/26/2016   IR RADIOLOGIST EVAL & MGMT 03/26/2016 Corrie Mckusick, DO [facility]  . IR GENERIC HISTORICAL  04/29/2016   IR RADIOLOGIST EVAL & MGMT 04/29/2016 [facility]  . IR RADIOLOGIST EVAL & MGMT  11/12/2016  . IR RADIOLOGIST EVAL & MGMT  12/16/2017  . KNEE SURGERY    . TONSILLECTOMY       Current Meds  Medication Sig  . acetaminophen (TYLENOL) 325 MG tablet Take 2 tablets (650 mg total) by mouth every 6 (six) hours as needed for mild pain (or Fever >/= 101).  Marland Kitchen albuterol (VENTOLIN HFA) 108 (90 Base) MCG/ACT inhaler Inhale 1-2 puffs into the lungs every 4 (four) hours as needed for wheezing or shortness of breath.  Marland Kitchen amLODipine (NORVASC) 10 MG tablet TAKE 1 TABLET BY MOUTH ONCE DAILY (Patient taking differently: Take 10 mg by mouth daily. )  . Ascorbic Acid (VITAMIN C) 1000 MG tablet Take 1,000 mg by mouth daily.  Marland Kitchen aspirin EC 81 MG tablet Take 81 mg by mouth daily.  Marland Kitchen azelastine (ASTELIN) 0.1 % nasal spray Place 2 sprays into both nostrils 2 (two) times daily. Use in each nostril as directed  . Carboxymethylcellul-Glycerin (LUBRICATING EYE DROPS OP) Apply 1 drop to eye daily as needed (dry eyes).   Marland Kitchen dicyclomine (BENTYL) 10 MG capsule Take 1 capsule (10 mg total) by mouth 2 (two) times daily.  Marland Kitchen Fexofenadine HCl (ALLEGRA PO) Take 1 capsule by mouth daily.   Marland Kitchen glucose blood (ONETOUCH VERIO) test strip 1 each by Other route as needed for other. Use as instructed to check blood pressure 2 times per day dx: e11.65  .  hydrochlorothiazide (MICROZIDE) 12.5 MG capsule Take 1 capsule (12.5 mg total) by mouth daily. (Patient taking differently: Take 12.5 mg by mouth daily as needed (fluid). )  . irbesartan (AVAPRO) 150 MG tablet TAKE 1 TABLET BY MOUTH EVERY DAY.  Marland Kitchen loperamide (IMODIUM) 2 MG capsule Take 1 capsule (2 mg total) by mouth 4 (four) times daily as needed for diarrhea or loose stools.  . Multiple Vitamins-Minerals (STRESS TAB NF PO) Take 1 tablet by mouth daily.  Marland Kitchen olopatadine (PATANOL) 0.1 % ophthalmic solution Place 1 drop into both eyes 2 (two) times daily.  Marland Kitchen omeprazole (PRILOSEC) 20 MG capsule TAKE 1 CAPSULE BY MOUTH ONCE DAILY 30 MINUTES TO 1 HOUR BEFORE A MEAL (Patient taking differently: Take 20 mg by mouth daily. )  . ondansetron (ZOFRAN) 8 MG tablet Take 1 tablet (8 mg total) by mouth every 8 (eight) hours as  needed for nausea or vomiting. (Patient not taking: Reported on 07/26/2019)  . rosuvastatin (CRESTOR) 20 MG tablet TAKE 1 TABLET BY MOUTH ONCE DAILY (Patient taking differently: Take 20 mg by mouth daily. )  . trolamine salicylate (ASPERCREME) 10 % cream Apply 1 application topically as needed for muscle pain.  . vitamin B-12 (CYANOCOBALAMIN) 1000 MCG tablet Take 1,000 mcg by mouth daily.  . [DISCONTINUED] ciprofloxacin (CIPRO) 500 MG tablet Take 1 tablet (500 mg total) by mouth 2 (two) times daily.  . [DISCONTINUED] metroNIDAZOLE (FLAGYL) 500 MG tablet Take 1 tablet (500 mg total) by mouth 3 (three) times daily.     Allergies:   Amoxicillin, Ampicillin, and Sulfa antibiotics   Social History   Tobacco Use  . Smoking status: Never Smoker  . Smokeless tobacco: Never Used  Substance Use Topics  . Alcohol use: No  . Drug use: No     Family Hx: The patient's family history includes Alzheimer's disease in her mother; Brain cancer in her brother and brother; Pancreatic cancer in her sister; Prostate cancer in her father. There is no history of Allergic rhinitis, Asthma, Eczema, or  Urticaria.  ROS:   Please see the history of present illness.    Review of Systems  Constitutional: Negative.   Respiratory: Negative.   Cardiovascular: Negative.   Gastrointestinal: Positive for diarrhea.  Neurological: Negative.   Psychiatric/Behavioral: Negative.     All other systems reviewed and are negative.   Labs/Other Tests and Data Reviewed:    Recent Labs: 03/24/2019: TSH 1.050 04/06/2019: BNP 115.3 07/11/2019: Magnesium 2.3 07/16/2019: ALT 31 07/25/2019: BUN 16; Creatinine, Ser 1.05; Hemoglobin 12.6; Platelets 464; Potassium 3.7; Sodium 143   Recent Lipid Panel Lab Results  Component Value Date/Time   CHOL 224 (H) 06/29/2016 03:30 AM   TRIG 180 (H) 06/29/2016 03:30 AM   HDL 64 06/29/2016 03:30 AM   CHOLHDL 3.5 06/29/2016 03:30 AM   LDLCALC 124 (H) 06/29/2016 03:30 AM    Wt Readings from Last 3 Encounters:  07/26/19 144 lb 2 oz (65.4 kg)  07/11/19 130 lb (59 kg)  06/28/19 139 lb (63 kg)     Exam:    Vital Signs:  Ht 5' 5.5" (1.664 m)   LMP  (LMP Unknown)   BMI 21.30 kg/m     Physical Exam  Nursing note and vitals reviewed. PE not performed, due to failed video call.   ASSESSMENT & PLAN:     1. Infectious colitis  TCM PERFORMED. A MEMBER OF THE CLINICAL TEAM SPOKE WITH THE PATIENT UPON DISCHARGE. DISCHARGE SUMMARY WAS REVIEWED IN FULL DETAIL DURING THE VISIT. MEDS RECONCILED AND COMPARED TO DISCHARGE MEDS. MEDICATION LIST WAS UPDATED AND REVIEWED WITH THE PATIENT. She was advised of the importance to complete full abx course. She is adamant that she cannot do this since they cause her to vomit. Pt has upcoming colonoscopy with GI, strongly encouraged her to keep this appt. I will check repeat CBC today. She is encouraged to avoid ALL foods that are known to trigger her sx of diarrhea/nausea. Also advised to incorporate baked/mashed potatoes into her diet.   - CBC with Diff; Future  2. Nausea and vomiting in adult  Resolved since stopping use of abx.  I will check her renal function and potassium level today.   - BMP8+EGFR; Future    COVID-19 Education: The signs and symptoms of COVID-19 were discussed with the patient and how to seek care for testing (follow up with PCP or arrange  E-visit).  The importance of social distancing was discussed today.  Patient Risk:   After full review of this patients clinical status, I feel that they are at least moderate risk at this time.  Time:   Today, I have spent 18 minutes with the patient with telehealth technology discussing above diagnoses.     Medication Adjustments/Labs and Tests Ordered: Current medicines are reviewed at length with the patient today.  Concerns regarding medicines are outlined above.   Tests Ordered: Orders Placed This Encounter  Procedures  . CBC with Diff  . BMP8+EGFR    Medication Changes: No orders of the defined types were placed in this encounter.   Disposition:  Follow up prn  Signed, Maximino Greenland, MD

## 2019-08-08 ENCOUNTER — Other Ambulatory Visit: Payer: Self-pay | Admitting: Internal Medicine

## 2019-08-08 MED FILL — OMEPRAZOLE 20 MG CAPSULE DR: 20 | 90 days supply | Qty: 90 | Fill #0

## 2019-08-09 ENCOUNTER — Ambulatory Visit (INDEPENDENT_AMBULATORY_CARE_PROVIDER_SITE_OTHER): Payer: Medicare Other | Admitting: Internal Medicine

## 2019-08-09 ENCOUNTER — Encounter: Payer: Self-pay | Admitting: Internal Medicine

## 2019-08-09 ENCOUNTER — Other Ambulatory Visit: Payer: Self-pay

## 2019-08-09 VITALS — BP 124/80 | HR 94 | Temp 98.6°F | Ht 63.0 in | Wt 138.8 lb

## 2019-08-09 DIAGNOSIS — I5189 Other ill-defined heart diseases: Secondary | ICD-10-CM

## 2019-08-09 DIAGNOSIS — E1122 Type 2 diabetes mellitus with diabetic chronic kidney disease: Secondary | ICD-10-CM | POA: Diagnosis not present

## 2019-08-09 DIAGNOSIS — N183 Chronic kidney disease, stage 3 unspecified: Secondary | ICD-10-CM

## 2019-08-09 DIAGNOSIS — K529 Noninfective gastroenteritis and colitis, unspecified: Secondary | ICD-10-CM

## 2019-08-09 DIAGNOSIS — I129 Hypertensive chronic kidney disease with stage 1 through stage 4 chronic kidney disease, or unspecified chronic kidney disease: Secondary | ICD-10-CM

## 2019-08-09 NOTE — Patient Instructions (Signed)
Mediterranean Diet A Mediterranean diet refers to food and lifestyle choices that are based on the traditions of countries located on the [place]. This way of eating has been shown to help prevent certain conditions and improve outcomes for people who have chronic diseases, like kidney disease and heart disease. What are tips for following this plan? Lifestyle  Cook and eat meals together with your family, when possible.  Drink enough fluid to keep your urine clear or pale yellow.  Be physically active every day. This includes: ? Aerobic exercise like running or swimming. ? Leisure activities like gardening, walking, or housework.  Get 7-8 hours of sleep each night.  If recommended by your health care provider, drink red wine in moderation. This means 1 glass a day for nonpregnant women and 2 glasses a day for men. A glass of wine equals 5 oz (150 mL). Reading food labels  Check the serving size of packaged foods. For foods such as rice and pasta, the serving size refers to the amount of cooked product, not dry.  Check the total fat in packaged foods. Avoid foods that have saturated fat or trans fats.  Check the ingredients list for added sugars, such as corn syrup.   Shopping  At the grocery store, buy most of your food from the areas near the walls of the store. This includes: ? Fresh fruits and vegetables (produce). ? Grains, beans, nuts, and seeds. Some of these may be available in unpackaged forms or large amounts (in bulk). ? Fresh seafood. ? Poultry and eggs. ? Low-fat dairy products.  Buy whole ingredients instead of prepackaged foods.  Buy fresh fruits and vegetables in-season from local farmers markets.  Buy frozen fruits and vegetables in resealable bags.  If you do not have access to quality fresh seafood, buy precooked frozen shrimp or canned fish, such as tuna, salmon, or sardines.  Buy small amounts of raw or cooked vegetables, salads, or olives from  the deli or salad bar at your store.  Stock your pantry so you always have certain foods on hand, such as olive oil, canned tuna, canned tomatoes, rice, pasta, and beans. Cooking  Cook foods with extra-virgin olive oil instead of using butter or other vegetable oils.  Have meat as a side dish, and have vegetables or grains as your main dish. This means having meat in small portions or adding small amounts of meat to foods like pasta or stew.  Use beans or vegetables instead of meat in common dishes like chili or lasagna.  Experiment with different cooking methods. Try roasting or broiling vegetables instead of steaming or sauteing them.  Add frozen vegetables to soups, stews, pasta, or rice.  Add nuts or seeds for added healthy fat at each meal. You can add these to yogurt, salads, or vegetable dishes.  Marinate fish or vegetables using olive oil, lemon juice, garlic, and fresh herbs. Meal planning  Plan to eat 1 vegetarian meal one day each week. Try to work up to 2 vegetarian meals, if possible.  Eat seafood 2 or more times a week.  Have healthy snacks readily available, such as: ? Vegetable sticks with hummus. ? Greek yogurt. ? Fruit and nut trail mix.  Eat balanced meals throughout the week. This includes: ? Fruit: 2-3 servings a day ? Vegetables: 4-5 servings a day ? Low-fat dairy: 2 servings a day ? Fish, poultry, or lean meat: 1 serving a day ? Beans and legumes: 2 or more servings a week ?   Nuts and seeds: 1-2 servings a day ? Whole grains: 6-8 servings a day ? Extra-virgin olive oil: 3-4 servings a day  Limit red meat and sweets to only a few servings a month   What are my food choices?  Mediterranean diet ? Recommended  Grains: Whole-grain pasta. Brown rice. Bulgar wheat. Polenta. Couscous. Whole-wheat bread. Oatmeal. Quinoa.  Vegetables: Artichokes. Beets. Broccoli. Cabbage. Carrots. Eggplant. Green beans. Chard. Kale. Spinach. Onions. Leeks. Peas. Squash.  Tomatoes. Peppers. Radishes.  Fruits: Apples. Apricots. Avocado. Berries. Bananas. Cherries. Dates. Figs. Grapes. Lemons. Melon. Oranges. Peaches. Plums. Pomegranate.  Meats and other protein foods: Beans. Almonds. Sunflower seeds. Pine nuts. Peanuts. Cod. Salmon. Scallops. Shrimp. Tuna. Tilapia. Clams. Oysters. Eggs.  Dairy: Low-fat milk. Cheese. Greek yogurt.  Beverages: Water. Red wine. Herbal tea.  Fats and oils: Extra virgin olive oil. Avocado oil. Grape seed oil.  Sweets and desserts: Greek yogurt with honey. Baked apples. Poached pears. Trail mix.  Seasoning and other foods: Basil. Cilantro. Coriander. Cumin. Mint. Parsley. Sage. Rosemary. Tarragon. Garlic. Oregano. Thyme. Pepper. Balsalmic vinegar. Tahini. Hummus. Tomato sauce. Olives. Mushrooms. ? Limit these  Grains: Prepackaged pasta or rice dishes. Prepackaged cereal with added sugar.  Vegetables: Deep fried potatoes (french fries).  Fruits: Fruit canned in syrup.  Meats and other protein foods: Beef. Pork. Lamb. Poultry with skin. Hot dogs. Bacon.  Dairy: Ice cream. Sour cream. Whole milk.  Beverages: Juice. Sugar-sweetened soft drinks. Beer. Liquor and spirits.  Fats and oils: Butter. Canola oil. Vegetable oil. Beef fat (tallow). Lard.  Sweets and desserts: Cookies. Cakes. Pies. Candy.  Seasoning and other foods: Mayonnaise. Premade sauces and marinades. The items listed may not be a complete list. Talk with your dietitian about what dietary choices are right for you. Summary  The Mediterranean diet includes both food and lifestyle choices.  Eat a variety of fresh fruits and vegetables, beans, nuts, seeds, and whole grains.  Limit the amount of red meat and sweets that you eat.  Talk with your health care provider about whether it is safe for you to drink red wine in moderation. This means 1 glass a day for nonpregnant women and 2 glasses a day for men. A glass of wine equals 5 oz (150 mL). This information  is not intended to replace advice given to you by your health care provider. Make sure you discuss any questions you have with your health care provider. Document Revised: 03/20/2016 Document Reviewed: 03/13/2016 Elsevier Patient Education  2020 [employer]  

## 2019-08-10 LAB — BMP8+EGFR
BUN/Creatinine Ratio: 16 (ref 12–28)
BUN: 18 mg/dL (ref 8–27)
CO2: 24 mmol/L (ref 20–29)
Calcium: 9.9 mg/dL (ref 8.7–10.3)
Chloride: 103 mmol/L (ref 96–106)
Creatinine, Ser: 1.15 mg/dL — ABNORMAL HIGH (ref 0.57–1.00)
GFR calc Af Amer: 51 mL/min/{1.73_m2} — ABNORMAL LOW (ref >59)
GFR calc non Af Amer: 44 mL/min/{1.73_m2} — ABNORMAL LOW (ref >59)
Glucose: 90 mg/dL (ref 65–99)
Potassium: 4.1 mmol/L (ref 3.5–5.2)
Sodium: 144 mmol/L (ref 134–144)

## 2019-08-11 ENCOUNTER — Observation Stay (HOSPITAL_COMMUNITY)
Admission: EM | Admit: 2019-08-11 | Discharge: 2019-08-12 | Disposition: A | Discharge status: Home / Self Care | Payer: Medicare Other | Attending: Family Medicine | Admitting: Family Medicine

## 2019-08-11 ENCOUNTER — Other Ambulatory Visit: Payer: Self-pay

## 2019-08-11 ENCOUNTER — Telehealth: Payer: Self-pay | Admitting: Gastroenterology

## 2019-08-11 ENCOUNTER — Emergency Department (HOSPITAL_COMMUNITY): Payer: Medicare Other

## 2019-08-11 ENCOUNTER — Encounter (HOSPITAL_COMMUNITY): Payer: Self-pay

## 2019-08-11 DIAGNOSIS — R9431 Abnormal electrocardiogram [ECG] [EKG]: Secondary | ICD-10-CM | POA: Diagnosis present

## 2019-08-11 DIAGNOSIS — Z880 Allergy status to penicillin: Secondary | ICD-10-CM | POA: Insufficient documentation

## 2019-08-11 DIAGNOSIS — M1990 Unspecified osteoarthritis, unspecified site: Secondary | ICD-10-CM | POA: Diagnosis not present

## 2019-08-11 DIAGNOSIS — Z7982 Long term (current) use of aspirin: Secondary | ICD-10-CM | POA: Diagnosis not present

## 2019-08-11 DIAGNOSIS — E876 Hypokalemia: Secondary | ICD-10-CM | POA: Diagnosis present

## 2019-08-11 DIAGNOSIS — I10 Essential (primary) hypertension: Secondary | ICD-10-CM | POA: Diagnosis present

## 2019-08-11 DIAGNOSIS — E782 Mixed hyperlipidemia: Secondary | ICD-10-CM | POA: Diagnosis not present

## 2019-08-11 DIAGNOSIS — Z881 Allergy status to other antibiotic agents status: Secondary | ICD-10-CM | POA: Diagnosis not present

## 2019-08-11 DIAGNOSIS — I129 Hypertensive chronic kidney disease with stage 1 through stage 4 chronic kidney disease, or unspecified chronic kidney disease: Secondary | ICD-10-CM | POA: Diagnosis not present

## 2019-08-11 DIAGNOSIS — K529 Noninfective gastroenteritis and colitis, unspecified: Principal | ICD-10-CM | POA: Diagnosis present

## 2019-08-11 DIAGNOSIS — R112 Nausea with vomiting, unspecified: Secondary | ICD-10-CM

## 2019-08-11 DIAGNOSIS — Z8673 Personal history of transient ischemic attack (TIA), and cerebral infarction without residual deficits: Secondary | ICD-10-CM | POA: Diagnosis not present

## 2019-08-11 DIAGNOSIS — K449 Diaphragmatic hernia without obstruction or gangrene: Secondary | ICD-10-CM | POA: Insufficient documentation

## 2019-08-11 DIAGNOSIS — Z882 Allergy status to sulfonamides status: Secondary | ICD-10-CM | POA: Insufficient documentation

## 2019-08-11 DIAGNOSIS — Z79899 Other long term (current) drug therapy: Secondary | ICD-10-CM | POA: Insufficient documentation

## 2019-08-11 DIAGNOSIS — Z20822 Contact with and (suspected) exposure to covid-19: Secondary | ICD-10-CM | POA: Insufficient documentation

## 2019-08-11 DIAGNOSIS — K5730 Diverticulosis of large intestine without perforation or abscess without bleeding: Secondary | ICD-10-CM | POA: Insufficient documentation

## 2019-08-11 DIAGNOSIS — N183 Chronic kidney disease, stage 3 unspecified: Secondary | ICD-10-CM | POA: Diagnosis not present

## 2019-08-11 DIAGNOSIS — E1122 Type 2 diabetes mellitus with diabetic chronic kidney disease: Secondary | ICD-10-CM | POA: Insufficient documentation

## 2019-08-11 DIAGNOSIS — N281 Cyst of kidney, acquired: Secondary | ICD-10-CM | POA: Diagnosis not present

## 2019-08-11 DIAGNOSIS — N1831 Chronic kidney disease, stage 3a: Secondary | ICD-10-CM | POA: Diagnosis present

## 2019-08-11 LAB — URINALYSIS, ROUTINE W REFLEX MICROSCOPIC
Bilirubin Urine: NEGATIVE
Glucose, UA: NEGATIVE mg/dL
Ketones, ur: 5 mg/dL — AB
Leukocytes,Ua: NEGATIVE
Nitrite: NEGATIVE
Protein, ur: 30 mg/dL — AB
Specific Gravity, Urine: 1.019 (ref 1.005–1.030)
pH: 7 (ref 5.0–8.0)

## 2019-08-11 LAB — CBC WITH DIFFERENTIAL/PLATELET
Abs Immature Granulocytes: 0.12 10*3/uL — ABNORMAL HIGH (ref 0.00–0.07)
Basophils Absolute: 0.1 10*3/uL (ref 0.0–0.1)
Basophils Relative: 0 %
Eosinophils Absolute: 0 10*3/uL (ref 0.0–0.5)
Eosinophils Relative: 0 %
HCT: 44.3 % (ref 36.0–46.0)
Hemoglobin: 14.1 g/dL (ref 12.0–15.0)
Immature Granulocytes: 1 %
Lymphocytes Relative: 6 %
Lymphs Abs: 1.3 10*3/uL (ref 0.7–4.0)
MCH: 29.3 pg (ref 26.0–34.0)
MCHC: 31.8 g/dL (ref 30.0–36.0)
MCV: 91.9 fL (ref 80.0–100.0)
Monocytes Absolute: 1 10*3/uL (ref 0.1–1.0)
Monocytes Relative: 5 %
Neutro Abs: 19 10*3/uL — ABNORMAL HIGH (ref 1.7–7.7)
Neutrophils Relative %: 88 %
Platelets: 375 10*3/uL (ref 150–400)
RBC: 4.82 MIL/uL (ref 3.87–5.11)
RDW: 14.1 % (ref 11.5–15.5)
WBC: 21.5 10*3/uL — ABNORMAL HIGH (ref 4.0–10.5)
nRBC: 0 % (ref 0.0–0.2)

## 2019-08-11 LAB — COMPREHENSIVE METABOLIC PANEL
ALT: 20 U/L (ref 0–44)
AST: 28 U/L (ref 15–41)
Albumin: 4.9 g/dL (ref 3.5–5.0)
Alkaline Phosphatase: 82 U/L (ref 38–126)
Anion gap: 17 — ABNORMAL HIGH (ref 5–15)
BUN: 17 mg/dL (ref 8–23)
CO2: 25 mmol/L (ref 22–32)
Calcium: 9.5 mg/dL (ref 8.9–10.3)
Chloride: 100 mmol/L (ref 98–111)
Creatinine, Ser: 1.2 mg/dL — ABNORMAL HIGH (ref 0.44–1.00)
GFR calc Af Amer: 49 mL/min — ABNORMAL LOW (ref >60)
GFR calc non Af Amer: 42 mL/min — ABNORMAL LOW (ref >60)
Glucose, Bld: 128 mg/dL — ABNORMAL HIGH (ref 70–99)
Potassium: 2.8 mmol/L — ABNORMAL LOW (ref 3.5–5.1)
Sodium: 142 mmol/L (ref 135–145)
Total Bilirubin: 0.6 mg/dL (ref 0.3–1.2)
Total Protein: 8.2 g/dL — ABNORMAL HIGH (ref 6.5–8.1)

## 2019-08-11 LAB — LACTIC ACID, PLASMA
Lactic Acid, Venous: 1.9 mmol/L (ref 0.5–1.9)
Lactic Acid, Venous: 1.2 mmol/L (ref 0.5–1.9)

## 2019-08-11 LAB — MAGNESIUM: Magnesium: 2.2 mg/dL (ref 1.7–2.4)

## 2019-08-11 MED ORDER — SODIUM CHLORIDE 0.9 % IV SOLN
2.0000 g | Freq: Once | INTRAVENOUS | Status: DC
  Filled 2019-08-11: qty 20

## 2019-08-11 MED ORDER — CIPROFLOXACIN IN D5W 400 MG/200ML IV SOLN: 400.0000 mg | Freq: Two times a day (BID) | INTRAVENOUS | Status: DC

## 2019-08-11 MED ORDER — POTASSIUM CHLORIDE IN NACL 40-0.9 MEQ/L-% IV SOLN
INTRAVENOUS | Status: DC
  Administered 2019-08-11 – 2019-08-12 (×2): 75 mL/h via INTRAVENOUS
  Filled 2019-08-11 (×2): qty 1000

## 2019-08-11 MED ORDER — ROSUVASTATIN CALCIUM 10 MG PO TABS
20.0000 mg | ORAL_TABLET | Freq: Every day | ORAL | Status: DC
  Administered 2019-08-12: 20 mg via ORAL
  Filled 2019-08-11: qty 1

## 2019-08-11 MED ORDER — METOCLOPRAMIDE HCL 5 MG/ML IJ SOLN
10.0000 mg | Freq: Once | INTRAMUSCULAR | Status: AC
  Administered 2019-08-11: 10 mg via INTRAVENOUS
  Filled 2019-08-11: qty 2

## 2019-08-11 MED ORDER — ONDANSETRON HCL 4 MG/2ML IJ SOLN: 4.0000 mg | Freq: Four times a day (QID) | INTRAMUSCULAR | Status: DC | PRN

## 2019-08-11 MED ORDER — METRONIDAZOLE IN NACL 5-0.79 MG/ML-% IV SOLN
500.0000 mg | Freq: Three times a day (TID) | INTRAVENOUS | Status: DC
  Administered 2019-08-12 (×2): 500 mg via INTRAVENOUS
  Filled 2019-08-11 (×2): qty 100

## 2019-08-11 MED ORDER — POTASSIUM CHLORIDE CRYS ER 20 MEQ PO TBCR
40.0000 meq | EXTENDED_RELEASE_TABLET | Freq: Once | ORAL | Status: AC
  Administered 2019-08-11: 20 meq via ORAL
  Filled 2019-08-11: qty 2

## 2019-08-11 MED ORDER — ALUM & MAG HYDROXIDE-SIMETH 200-200-20 MG/5ML PO SUSP: 30.0000 mL | Freq: Once | ORAL | Status: DC

## 2019-08-11 MED ORDER — ASPIRIN EC 81 MG PO TBEC
81.0000 mg | DELAYED_RELEASE_TABLET | Freq: Every day | ORAL | Status: DC
  Administered 2019-08-12: 81 mg via ORAL
  Filled 2019-08-11 (×2): qty 1

## 2019-08-11 MED ORDER — SODIUM CHLORIDE 0.9 % IV SOLN
2.0000 g | Freq: Every day | INTRAVENOUS | Status: DC
  Administered 2019-08-12: 2 g via INTRAVENOUS
  Filled 2019-08-11: qty 20

## 2019-08-11 MED ORDER — IOHEXOL 300 MG/ML  SOLN
100.0000 mL | Freq: Once | INTRAMUSCULAR | Status: AC | PRN
  Administered 2019-08-11: 80 mL via INTRAVENOUS

## 2019-08-11 MED ORDER — POTASSIUM CHLORIDE 10 MEQ/100ML IV SOLN
10.0000 meq | INTRAVENOUS | Status: AC
  Administered 2019-08-11 (×2): 10 meq via INTRAVENOUS
  Filled 2019-08-11 (×2): qty 100

## 2019-08-11 MED ORDER — ALBUTEROL SULFATE (2.5 MG/3ML) 0.083% IN NEBU: 2.5000 mg | INHALATION_SOLUTION | RESPIRATORY_TRACT | Status: DC | PRN

## 2019-08-11 MED ORDER — PROMETHAZINE HCL 25 MG/ML IJ SOLN
12.5000 mg | Freq: Four times a day (QID) | INTRAMUSCULAR | Status: DC | PRN
  Filled 2019-08-11: qty 1

## 2019-08-11 MED ORDER — ONDANSETRON HCL 4 MG PO TABS: 4.0000 mg | ORAL_TABLET | Freq: Four times a day (QID) | ORAL | Status: DC | PRN

## 2019-08-11 MED ORDER — ONDANSETRON HCL 4 MG/2ML IJ SOLN
4.0000 mg | Freq: Once | INTRAMUSCULAR | Status: AC
  Administered 2019-08-11: 4 mg via INTRAVENOUS
  Filled 2019-08-11: qty 2

## 2019-08-11 MED ORDER — PROMETHAZINE HCL 25 MG PO TABS: 12.5000 mg | ORAL_TABLET | Freq: Four times a day (QID) | ORAL | Status: DC | PRN

## 2019-08-11 MED ORDER — SODIUM CHLORIDE 0.9 % IV BOLUS
1000.0000 mL | Freq: Once | INTRAVENOUS | Status: AC
  Administered 2019-08-11: 1000 mL via INTRAVENOUS

## 2019-08-11 MED ORDER — DICYCLOMINE HCL 10 MG PO CAPS
10.0000 mg | ORAL_CAPSULE | Freq: Two times a day (BID) | ORAL | Status: DC
  Administered 2019-08-12 (×2): 10 mg via ORAL
  Filled 2019-08-11 (×2): qty 1

## 2019-08-11 MED ORDER — AMLODIPINE BESYLATE 10 MG PO TABS
10.0000 mg | ORAL_TABLET | Freq: Every day | ORAL | Status: DC
  Administered 2019-08-12: 10 mg via ORAL
  Filled 2019-08-11 (×2): qty 2

## 2019-08-11 MED ORDER — METRONIDAZOLE IN NACL 5-0.79 MG/ML-% IV SOLN
500.0000 mg | Freq: Once | INTRAVENOUS | Status: AC
  Administered 2019-08-11: 500 mg via INTRAVENOUS
  Filled 2019-08-11: qty 100

## 2019-08-11 NOTE — ED Triage Notes (Signed)
Pt states that she has been having emesis x 2 today and multiple episodes of emesis. Pt states that it started yesterday, similar to what she experienced 3 weeks ago. Denies CP/SHOB.

## 2019-08-11 NOTE — ED Notes (Signed)
RN gave patient oral potassium and patient only able to tolerate 1 pill. After a few minutes patient threw up first potassium pill. Will notify MD.

## 2019-08-11 NOTE — ED Notes (Signed)
Lab called to add urine that was sent down earlier.

## 2019-08-11 NOTE — ED Notes (Signed)
Pt ambulated to restroom without concern.

## 2019-08-11 NOTE — H&P (Signed)
History and Physical    Anna Hoffman NUU:[id number] DOB: Mar 10, 1935 DOA: 08/11/2019  PCP: Glendale Chard, MD  Patient coming from: Home  Chief Complaint: Abdominal pain nausea vomiting diarrhea  HPI: Anna Hoffman is a 85 y.o. female with medical history significant of hypertension previous diagnosed with diverticulitis approximately month ago for which she could not finish her antibiotics because she cannot vomiting.  She got better and then approximately 2 to 3 days ago she started having right lower quadrant abdominal pain associated with vomiting and diarrhea again.  She seen GI symptoms have been attributed to IBS.  She has an outpatient colonoscopy scheduled.  Patient denies any fevers.  Vomiting and diarrhea have been nonbloody.  Patient be referred for admission for a potassium level 2.8.  Abdominal exam is benign.  CT shows colitis.  She denies any Covid symptoms respiratory wise.  Review of Systems: As per HPI otherwise 10 point review of systems negative.   Past Medical History:  Diagnosis Date  . Arthritis   . Diabetes mellitus without complication ([place])   . Hypertension   . Hypokalemia   . Pneumonia   . Shingles   . Stroke [facility])     Past Surgical History:  Procedure Laterality Date  . ABDOMINAL HYSTERECTOMY    . BREAST EXCISIONAL BIOPSY Left   . ESOPHAGOGASTRODUODENOSCOPY N/A 09/01/2014   Procedure: ESOPHAGOGASTRODUODENOSCOPY (EGD);  Surgeon: Lear Ng, MD;  Location: [facility];  Service: Endoscopy;  Laterality: N/A;  . HIATAL HERNIA REPAIR N/A 09/04/2014   Procedure: LAPAROSCOPIC REPAIR OF HIATAL HERNIA;  Surgeon: Excell Seltzer, MD;  Location: [facility];  Service: General;  Laterality: N/A;  With MESH  . IR GENERIC HISTORICAL  04/10/2016   IR US GUIDE VASC ACCESS RIGHT 04/10/2016 Corrie Mckusick, DO [facility]  . IR GENERIC HISTORICAL  04/10/2016   IR ANGIOGRAM SELECTIVE EACH ADDITIONAL VESSEL 04/10/2016 Corrie Mckusick, DO [facility]  . IR GENERIC HISTORICAL   04/10/2016   IR ANGIOGRAM SELECTIVE EACH ADDITIONAL VESSEL 04/10/2016 Corrie Mckusick, DO [facility]  . IR GENERIC HISTORICAL  04/10/2016   IR EMBO TUMOR ORGAN ISCHEMIA INFARCT INC GUIDE ROADMAPPING 04/10/2016 Corrie Mckusick, DO [facility]  . IR GENERIC HISTORICAL  04/10/2016   IR ANGIOGRAM SELECTIVE EACH ADDITIONAL VESSEL 04/10/2016 Corrie Mckusick, DO [facility]  . IR GENERIC HISTORICAL  04/10/2016   IR ANGIOGRAM SELECTIVE EACH ADDITIONAL VESSEL 04/10/2016 Corrie Mckusick, DO [facility]  . IR GENERIC HISTORICAL  04/10/2016   IR RENAL SELECTIVE  UNI INC S&I MOD SED 04/10/2016 Corrie Mckusick, DO [facility]  . IR GENERIC HISTORICAL  03/06/2016   IR RADIOLOGIST EVAL & MGMT 03/06/2016 Corrie Mckusick, DO [facility]  . IR GENERIC HISTORICAL  03/26/2016   IR RADIOLOGIST EVAL & MGMT 03/26/2016 Corrie Mckusick, DO [facility]  . IR GENERIC HISTORICAL  04/29/2016   IR RADIOLOGIST EVAL & MGMT 04/29/2016 [facility]  . IR RADIOLOGIST EVAL & MGMT  11/12/2016  . IR RADIOLOGIST EVAL & MGMT  12/16/2017  . KNEE SURGERY    . TONSILLECTOMY       reports that she has never smoked. She has never used smokeless tobacco. She reports that she does not drink alcohol or use drugs.  Allergies  Allergen Reactions  . Amoxicillin Diarrhea    Has patient had a PCN reaction causing immediate rash, facial/tongue/throat swelling, SOB or lightheadedness with hypotension: no Has patient had a PCN reaction causing severe rash involving mucus membranes or skin  necrosis: no Has patient had a PCN reaction that required hospitalization: no pharmacist consult Has patient had a PCN reaction occurring within the last 10 years: yes If all of the above answers are "NO", then may proceed with Cephalosporin use.   . Ampicillin Rash    Has patient had a PCN reaction causing immediate rash, facial/tongue/throat swelling, SOB or lightheadedness with hypotension: No Has patient had a PCN reaction causing severe rash involving mucus membranes or  skin necrosis: No Has patient had a PCN reaction that required hospitalization No Has patient had a PCN reaction occurring within the last 10 years: No If all of the above answers are "NO", then may proceed with Cephalosporin use.   . Sulfa Antibiotics Rash    Family History  Problem Relation Age of Onset  . Alzheimer's disease Mother   . Prostate cancer Father   . Brain cancer Brother   . Brain cancer Brother   . Pancreatic cancer Sister   . Allergic rhinitis Neg Hx   . Asthma Neg Hx   . Eczema Neg Hx   . Urticaria Neg Hx     Prior to Admission medications   Medication Sig Start Date End Date Taking? Authorizing Provider  acetaminophen (TYLENOL) 325 MG tablet Take 2 tablets (650 mg total) by mouth every 6 (six) hours as needed for mild pain (or Fever >/= 101). 02/08/16  Yes Johnson, Clanford L, MD  albuterol (VENTOLIN HFA) 108 (90 Base) MCG/ACT inhaler Inhale 1-2 puffs into the lungs every 4 (four) hours as needed for wheezing or shortness of breath. 06/15/19  Yes Padgett, Rae Halsted, MD  amLODipine (NORVASC) 10 MG tablet TAKE 1 TABLET BY MOUTH ONCE DAILY Patient taking differently: Take 10 mg by mouth daily.  10/29/18  Yes Glendale Chard, MD  Ascorbic Acid (VITAMIN C) 1000 MG tablet Take 1,000 mg by mouth daily.   Yes [provider]  aspirin EC 81 MG tablet Take 81 mg by mouth daily.   Yes [provider]  azelastine (ASTELIN) 0.1 % nasal spray Place 2 sprays into both nostrils 2 (two) times daily. Use in each nostril as directed Patient taking differently: Place 2 sprays into both nostrils daily as needed for allergies. Use in each nostril as directed 10/06/18  Yes Padgett, Rae Halsted, MD  Carboxymethylcellul-Glycerin (LUBRICATING EYE DROPS OP) Apply 1 drop to eye daily as needed (dry eyes).    Yes [provider]  dicyclomine (BENTYL) 10 MG capsule Take 1 capsule (10 mg total) by mouth 2 (two) times daily. 06/06/19  Yes Zehr, Laban Emperor, PA-C   Fexofenadine HCl (ALLEGRA PO) Take 180 mg by mouth at bedtime.    Yes [provider]  irbesartan (AVAPRO) 150 MG tablet TAKE 1 TABLET BY MOUTH EVERY DAY. Patient taking differently: Take 150 mg by mouth daily.  03/17/19  Yes Glendale Chard, MD  loperamide (IMODIUM) 2 MG capsule Take 1 capsule (2 mg total) by mouth 4 (four) times daily as needed for diarrhea or loose stools. 02/13/19  Yes Couture, Cortni S, PA-C  Multiple Vitamins-Minerals (STRESS TAB NF PO) Take 1 tablet by mouth daily.   Yes [provider]  olopatadine (PATANOL) 0.1 % ophthalmic solution Place 1 drop into both eyes 2 (two) times daily. 06/15/19  Yes Padgett, Rae Halsted, MD  omeprazole (PRILOSEC) 20 MG capsule TAKE 1 CAPSULE BY MOUTH ONCE DAILY 30 MINUTES TO 1 HOUR BEFORE A MEAL Patient taking differently: Take 20 mg by mouth daily.  08/08/19  Yes Glendale Chard, MD  ondansetron (ZOFRAN) 8 MG tablet Take 1 tablet (8 mg total) by mouth every 8 (eight) hours as needed for nausea or vomiting. 07/16/19  Yes Daleen Bo, MD  rosuvastatin (CRESTOR) 20 MG tablet TAKE 1 TABLET BY MOUTH ONCE DAILY Patient taking differently: Take 20 mg by mouth daily.  10/29/18  Yes Glendale Chard, MD  trolamine salicylate (ASPERCREME) 10 % cream Apply 1 application topically as needed for muscle pain.   Yes [provider]  vitamin B-12 (CYANOCOBALAMIN) 1000 MCG tablet Take 1,000 mcg by mouth daily.   Yes [provider]  glucose blood (ONETOUCH VERIO) test strip 1 each by Other route as needed for other. Use as instructed to check blood pressure 2 times per day dx: e11.65 10/25/18   Glendale Chard, MD    Physical Exam: Vitals:   08/11/19 2000 08/11/19 2030 08/11/19 2032 08/11/19 2100  BP: (!) 164/88 (!) 158/93  (!) 148/81  Pulse: 97 (!) 101  99  Resp: 18 18  19   Temp:   99.8 F (37.7 C)   TempSrc:   Rectal   SpO2: 94% 94%  96%      Constitutional: NAD, calm, comfortable Vitals:   08/11/19 2000  08/11/19 2030 08/11/19 2032 08/11/19 2100  BP: (!) 164/88 (!) 158/93  (!) 148/81  Pulse: 97 (!) 101  99  Resp: 18 18  19   Temp:   99.8 F (37.7 C)   TempSrc:   Rectal   SpO2: 94% 94%  96%   Eyes: PERRL, lids and conjunctivae normal ENMT: Mucous membranes are moist. Posterior pharynx clear of any exudate or lesions.Normal dentition.  Neck: normal, supple, no masses, no thyromegaly Respiratory: clear to auscultation bilaterally, no wheezing, no crackles. Normal respiratory effort. No accessory muscle use.  Cardiovascular: Regular rate and rhythm, no murmurs / rubs / gallops. No extremity edema. 2+ pedal pulses. No carotid bruits.  Abdomen: no tenderness, no masses palpated. No hepatosplenomegaly. Bowel sounds positive.  Musculoskeletal: no clubbing / cyanosis. No joint deformity upper and lower extremities. Good ROM, no contractures. Normal muscle tone.  Skin: no rashes, lesions, ulcers. No induration Neurologic: CN 2-12 grossly intact. Sensation intact, DTR normal. Strength 5/5 in all 4.  Psychiatric: Normal judgment and insight. Alert and oriented x 3. Normal mood.    Labs on Admission: I have personally reviewed following labs and imaging studies  CBC: Recent Labs  Lab 08/11/19 1258  WBC 21.5*  NEUTROABS 19.0*  HGB 14.1  HCT 44.3  MCV 91.9  PLT 097   Basic Metabolic Panel: Recent Labs  Lab 08/09/19 1633 08/11/19 1258  NA 144 142  K 4.1 2.8*  CL 103 100  CO2 24 25  GLUCOSE 90 128*  BUN 18 17  CREATININE 1.15* 1.20*  CALCIUM 9.9 9.5  MG  --  2.2   GFR: Estimated Creatinine Clearance: 32.3 mL/min (A) (by C-G formula based on SCr of 1.2 mg/dL (H)). Liver Function Tests: Recent Labs  Lab 08/11/19 1258  AST 28  ALT 20  ALKPHOS 82  BILITOT 0.6  PROT 8.2*  ALBUMIN 4.9   No results for input(s): LIPASE, AMYLASE in the last 168 hours. No results for input(s): AMMONIA in the last 168 hours. Coagulation Profile: No results for input(s): INR, PROTIME in the last  168 hours. Cardiac Enzymes: No results for input(s): CKTOTAL, CKMB, CKMBINDEX, TROPONINI in the last 168 hours. BNP (last 3 results) No results for input(s): PROBNP in the last 8760 hours.  HbA1C: No results for input(s): HGBA1C in the last 72 hours. CBG: No results for input(s): GLUCAP in the last 168 hours. Lipid Profile: No results for input(s): CHOL, HDL, LDLCALC, TRIG, CHOLHDL, LDLDIRECT in the last 72 hours. Thyroid Function Tests: No results for input(s): TSH, T4TOTAL, FREET4, T3FREE, THYROIDAB in the last 72 hours. Anemia Panel: No results for input(s): VITAMINB12, FOLATE, FERRITIN, TIBC, IRON, RETICCTPCT in the last 72 hours. Urine analysis:    Component Value Date/Time   COLORURINE YELLOW 07/11/2019 1209   APPEARANCEUR CLEAR 07/11/2019 1209   LABSPEC 1.018 07/11/2019 1209   PHURINE 6.0 07/11/2019 1209   GLUCOSEU NEGATIVE 07/11/2019 1209   HGBUR NEGATIVE 07/11/2019 1209   [place] 07/11/2019 1209   KETONESUR NEGATIVE 07/11/2019 1209   PROTEINUR 30 (A) 07/11/2019 1209   UROBILINOGEN 0.2 09/01/2014 0300   NITRITE NEGATIVE 07/11/2019 1209   LEUKOCYTESUR NEGATIVE 07/11/2019 1209   Sepsis Labs: !!!!!!!!!!!!!!!!!!!!!!!!!!!!!!!!!!!!!!!!!!!! @LABRCNTIP (procalcitonin:4,lacticidven:4) )No results found for this or any previous visit (from the past 240 hour(s)).   Radiological Exams on Admission: CT ABDOMEN PELVIS W CONTRAST  Result Date: 08/11/2019 CLINICAL DATA:  Vomiting. EXAM: CT ABDOMEN AND PELVIS WITH CONTRAST TECHNIQUE: Multidetector CT imaging of the abdomen and pelvis was performed using the standard protocol following bolus administration of intravenous contrast. CONTRAST:  14mL OMNIPAQUE IOHEXOL 300 MG/ML  SOLN COMPARISON:  July 11, 2019 FINDINGS: Lower chest: No acute abnormality. Hepatobiliary: No focal liver abnormality is seen. The gallbladder is moderately distended. No gallstones, gallbladder wall thickening, or biliary dilatation. Pancreas:  Unremarkable. No pancreatic ductal dilatation or surrounding inflammatory changes. Spleen: Normal in size without focal abnormality. Adrenals/Urinary Tract: Adrenal glands are unremarkable. Kidneys are normal in size, without renal calculi or hydronephrosis. A 3.2 cm simple cyst is seen within the lower pole of the left kidney. Additional subcentimeter bilateral renal cysts are noted. A stable, approximately 2.5 cm x 1.5 cm predominately fat containing mass is again seen within the anterior aspect of the mid left kidney. Bladder is unremarkable. Stomach/Bowel: There is a large hiatal hernia. The appendix is not clearly identified. No evidence of bowel wall distention. Numerous diverticula are seen throughout the sigmoid colon. There is persistent moderate severity thickening of the proximal sigmoid colon. No significant pericolonic inflammatory fat stranding is seen. Vascular/Lymphatic: Marked severity aortic atherosclerosis. No enlarged abdominal or pelvic lymph nodes. Reproductive: Status post hysterectomy. No adnexal masses. Other: No abdominal wall hernia or abnormality. No abdominopelvic ascites. Musculoskeletal: No acute or significant osseous findings. IMPRESSION: 1. Stable moderate severity thickening of the proximal sigmoid colon which is unchanged in appearance when compared to the prior study dated July 11, 2019. While this may represent sequelae associated with colitis and/or diverticulitis, further evaluation is recommended to exclude the presence of an underlying neoplastic process. 2. Large, stable gastric hernia. 3. 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 Electronically Signed   By: Virgina Norfolk M.D.   On: 08/11/2019 19:12   Old chart reviewed Case discussed with EDP   Assessment/Plan 85 year old female with acute colitis Principal Problem:   Colitis-placed on Rocephin and Flagyl.  Stool culture pending pending  abdominal exam benign.  Tolerating some p.o. at this time.  Observe overnight and discharge tomorrow if can tolerate p.o.  Active Problems:   Hypokalemia-repleting potassium level both p.o. and IV.  Check mag level.  Repeat potassium level in morning.    Benign essential HTN-stable    Chronic renal disease, stage III-stable  DVT prophylaxis: Ambulate Code Status: Full Family Communication: None Disposition Plan: 1 to 2 days Consults called: None Admission status: Observation   Ramata Strothman A MD [facility]  If 7PM-7AM, please contact night-coverage [email or web address] Password [email or web address]  08/11/2019, 9:30 PM

## 2019-08-11 NOTE — Telephone Encounter (Signed)
The pt states she woke up with fever (she is not sure of actually temp) and chills. She also complains of diarrhea and vomiting.  She states she is actually on her way to the ED now for eval.

## 2019-08-11 NOTE — ED Provider Notes (Signed)
Received patient as a handoff from Anna Robinson, PA-C at shift change.  HPI as obtained from handoff provider: [name] is a 85 y.o. female w PMHx DM, HTN, chronic diarrhea, stroke, presenting to the ED with complaint of diarrhea and vomiting that began yesterday. She states she has had recurrence of these episodes multiple times and symptoms today are similar to previous. She reports nausea and chills, though no fever. She treated her symptoms with imodium this morning however was unable to keep it down. She reports some mild right sided abd pain before a bowel movement, though no current pain. Denies constipation, urinary sx, cough.  Patient is followed by [facility] with recent outpatient visit on 07/26/2019.  Per documentation her symptoms are suspected to be consistent with IBS.  She is scheduled for outpatient colonoscopy.   Physical Exam  BP (!) 148/81   Pulse 99   Temp 99.8 F (37.7 C) (Rectal)   Resp 19   LMP  (LMP Unknown)   SpO2 96%   Physical Exam Vitals and nursing note reviewed. Exam conducted with a chaperone present.  Constitutional:      Appearance: Normal appearance.  HENT:     Head: Normocephalic and atraumatic.  Eyes:     General: No scleral icterus.    Conjunctiva/sclera: Conjunctivae normal.  Cardiovascular:     Rate and Rhythm: Regular rhythm. Tachycardia present.     Pulses: Normal pulses.     Heart sounds: Normal heart sounds.  Pulmonary:     Effort: Pulmonary effort is normal. No respiratory distress.     Breath sounds: Normal breath sounds.  Abdominal:     Comments: Soft, nondistended.  Mild TTP in LLQ, but no tenderness elsewhere.  No overlying skin changes.  No guarding.  Skin:    General: Skin is dry.  Neurological:     Mental Status: She is alert and oriented to person, place, and time.     GCS: GCS eye subscore is 4. GCS verbal subscore is 5. GCS motor subscore is 6.  Psychiatric:        Mood and Affect: Mood normal.        Behavior:  Behavior normal.        Thought Content: Thought content normal.      ED Course/Procedures     Procedures Results for orders placed or performed during the hospital encounter of 08/11/19  Comprehensive metabolic panel  Result Value Ref Range   Sodium 142 135 - 145 mmol/L   Potassium 2.8 (L) 3.5 - 5.1 mmol/L   Chloride 100 98 - 111 mmol/L   CO2 25 22 - 32 mmol/L   Glucose, Bld 128 (H) 70 - 99 mg/dL   BUN 17 8 - 23 mg/dL   Creatinine, Ser 1.20 (H) 0.44 - 1.00 mg/dL   Calcium 9.5 8.9 - 10.3 mg/dL   Total Protein 8.2 (H) 6.5 - 8.1 g/dL   Albumin 4.9 3.5 - 5.0 g/dL   AST 28 15 - 41 U/L   ALT 20 0 - 44 U/L   Alkaline Phosphatase 82 38 - 126 U/L   Total Bilirubin 0.6 0.3 - 1.2 mg/dL   GFR calc non Af Amer 42 (L) >60 mL/min   GFR calc Af Amer 49 (L) >60 mL/min   Anion gap 17 (H) 5 - 15  CBC with Differential  Result Value Ref Range   WBC 21.5 (H) 4.0 - 10.5 K/uL   RBC 4.82 3.87 - 5.11 MIL/uL  Hemoglobin 14.1 12.0 - 15.0 g/dL   HCT 44.3 36.0 - 46.0 %   MCV 91.9 80.0 - 100.0 fL   MCH 29.3 26.0 - 34.0 pg   MCHC 31.8 30.0 - 36.0 g/dL   RDW 14.1 11.5 - 15.5 %   Platelets 375 150 - 400 K/uL   nRBC 0.0 0.0 - 0.2 %   Neutrophils Relative % 88 %   Neutro Abs 19.0 (H) 1.7 - 7.7 K/uL   Lymphocytes Relative 6 %   Lymphs Abs 1.3 0.7 - 4.0 K/uL   Monocytes Relative 5 %   Monocytes Absolute 1.0 0.1 - 1.0 K/uL   Eosinophils Relative 0 %   Eosinophils Absolute 0.0 0.0 - 0.5 K/uL   Basophils Relative 0 %   Basophils Absolute 0.1 0.0 - 0.1 K/uL   Immature Granulocytes 1 %   Abs Immature Granulocytes 0.12 (H) 0.00 - 0.07 K/uL  Lactic acid, plasma  Result Value Ref Range   Lactic Acid, Venous 1.9 0.5 - 1.9 mmol/L  Lactic acid, plasma  Result Value Ref Range   Lactic Acid, Venous 1.2 0.5 - 1.9 mmol/L  Magnesium  Result Value Ref Range   Magnesium 2.2 1.7 - 2.4 mg/dL   CT ABDOMEN PELVIS W CONTRAST  Result Date: 08/11/2019 CLINICAL DATA:  Vomiting. EXAM: CT ABDOMEN AND PELVIS WITH  CONTRAST TECHNIQUE: Multidetector CT imaging of the abdomen and pelvis was performed using the standard protocol following bolus administration of intravenous contrast. CONTRAST:  57mL OMNIPAQUE IOHEXOL 300 MG/ML  SOLN COMPARISON:  July 11, 2019 FINDINGS: Lower chest: No acute abnormality. Hepatobiliary: No focal liver abnormality is seen. The gallbladder is moderately distended. No gallstones, gallbladder wall thickening, or biliary dilatation. Pancreas: Unremarkable. No pancreatic ductal dilatation or surrounding inflammatory changes. Spleen: Normal in size without focal abnormality. Adrenals/Urinary Tract: Adrenal glands are unremarkable. Kidneys are normal in size, without renal calculi or hydronephrosis. A 3.2 cm simple cyst is seen within the lower pole of the left kidney. Additional subcentimeter bilateral renal cysts are noted. A stable, approximately 2.5 cm x 1.5 cm predominately fat containing mass is again seen within the anterior aspect of the mid left kidney. Bladder is unremarkable. Stomach/Bowel: There is a large hiatal hernia. The appendix is not clearly identified. No evidence of bowel wall distention. Numerous diverticula are seen throughout the sigmoid colon. There is persistent moderate severity thickening of the proximal sigmoid colon. No significant pericolonic inflammatory fat stranding is seen. Vascular/Lymphatic: Marked severity aortic atherosclerosis. No enlarged abdominal or pelvic lymph nodes. Reproductive: Status post hysterectomy. No adnexal masses. Other: No abdominal wall hernia or abnormality. No abdominopelvic ascites. Musculoskeletal: No acute or significant osseous findings. IMPRESSION: 1. Stable moderate severity thickening of the proximal sigmoid colon which is unchanged in appearance when compared to the prior study dated July 11, 2019. While this may represent sequelae associated with colitis and/or diverticulitis, further evaluation is recommended to exclude the  presence of an underlying neoplastic process. 2. Large, stable gastric hernia. 3. 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 Electronically Signed   By: Virgina Norfolk M.D.   On: 08/11/2019 19:12     MDM   Gastroenterology feels as though patient's recurrent episodes of loose stools as well as nausea and vomiting are likely attributable to IBS.  Labs demonstrated new leukocytosis to 21.5 as well as hypokalemia to 2.8 with EKG changes of prolonged QTCc of 584.  Attempted to replenish potassium with p.o. K-Dur, which patient  was unable to tolerate and resulted in continued nausea and vomiting.  Reglan provided as advised by Dr. Regenia Skeeter.  Plan was to admit patient unless she could demonstrate that she is able to tolerate p.o. and if her QT improves with repeat EKG after IV potassium.  On my exam, patient was complaining of heartburn secondary to her nausea and vomiting.  Attempted to p.o. challenge patient with Maalox, which she refused per RN.  Last CT abdomen and pelvis was 07/11/2019 and demonstrated diverticulosis and evidence of colitis.  Will re-scan today given new leukocytosis in setting of uncontrolled N/V.  Given her inability to tolerate p.o. in the setting of QT prolongation and electrolyte derangement, will likely admit patient to hospitalist services.  Discussed case with Dr. Roderic Palau and he voiced understanding and agreement.  CT demonstrates stable moderate severity thickening in the proximal sigmoid colon, possible colitis versus diverticulitis.  Requiring colonoscopy to rule out underlying neoplastic process.    Spoke with hospitalist who will evaluate and admit patient for her electrolyte derangement, limited ability to eat and drink, as well as her new leukocytosis possibly attributable to her findings of colitis vs diverticulitis on CT abdomen and pelvis. Started her on IV rocephin and IV flagyl.      Corena Herter, PA-C 08/11/19 2113    Milton Ferguson, MD 08/11/19 2324

## 2019-08-11 NOTE — ED Provider Notes (Addendum)
[facility] DEPT Provider Note   CSN: [id number] Arrival date & time: 08/11/19  1124     History Chief Complaint  Patient presents with  . Diarrhea  . Emesis    Anna Hoffman is a 85 y.o. female w PMHx DM, HTN, chronic diarrhea, stroke, presenting to the ED with complaint of diarrhea and vomiting that began yesterday. She states she has had recurrence of these episodes multiple times and symptoms today are similar to previous. She reports nausea and chills, though no fever. She treated her symptoms with imodium this morning however was unable to keep it down. She reports some mild right sided abd pain before a bowel movement, though no current pain. Denies constipation, urinary sx, cough.  Patient is followed by [facility] with recent outpatient visit on 07/26/2019.  Per documentation her symptoms are suspected to be consistent with IBS.  She is scheduled for outpatient colonoscopy.   The history is provided by the patient and medical records.       Past Medical History:  Diagnosis Date  . Arthritis   . Diabetes mellitus without complication ([place])   . Hypertension   . Hypokalemia   . Pneumonia   . Shingles   . Stroke [facility])     Patient Active Problem List   Diagnosis Date Noted  . Colitis 07/11/2019  . Loose stools 06/07/2019  . Herpes zoster without complication 35/46/5681  . Other long term (current) drug therapy 08/12/2018  . Chronic renal disease, stage III 07/19/2018  . Hypertensive nephropathy 07/19/2018  . Community acquired pneumonia of right upper lobe of lung 11/07/2017  . Hypertensive emergency 07/01/2016  . Thalamic hemorrhage ([place]) 07/01/2016  . 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 Benign essential HTN   . Type 2 diabetes mellitus with stage 3 chronic kidney disease, without long-term current use of insulin ([place])   . Mixed hyperlipidemia   . ICH (intracerebral hemorrhage) ([place]) - hypertensive R thalamic hemorrhage   06/27/2016  . 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 Retroperitoneal bleed 02/08/2016  . Generalized abdominal pain   . Gastroenteritis 02/04/2016  . Diarrhea 02/04/2016  . Hypokalemia 02/04/2016  . Incarcerated paraesophageal hernia 09/02/2014  . Renal mass, left 09/02/2014  . Acute esophagitis 09/02/2014  . Gastric outlet obstruction 09/02/2014  . Hypertension   . Vomiting 09/01/2014    Past Surgical History:  Procedure Laterality Date  . ABDOMINAL HYSTERECTOMY    . BREAST EXCISIONAL BIOPSY Left   . ESOPHAGOGASTRODUODENOSCOPY N/A 09/01/2014   Procedure: ESOPHAGOGASTRODUODENOSCOPY (EGD);  Surgeon: Lear Ng, MD;  Location: [facility];  Service: Endoscopy;  Laterality: N/A;  . HIATAL HERNIA REPAIR N/A 09/04/2014   Procedure: LAPAROSCOPIC REPAIR OF HIATAL HERNIA;  Surgeon: Excell Seltzer, MD;  Location: [facility];  Service: General;  Laterality: N/A;  With MESH  . IR GENERIC HISTORICAL  04/10/2016   IR US GUIDE VASC ACCESS RIGHT 04/10/2016 Corrie Mckusick, DO [facility]  . IR GENERIC HISTORICAL  04/10/2016   IR ANGIOGRAM SELECTIVE EACH ADDITIONAL VESSEL 04/10/2016 Corrie Mckusick, DO [facility]  . IR GENERIC HISTORICAL  04/10/2016   IR ANGIOGRAM SELECTIVE EACH ADDITIONAL VESSEL 04/10/2016 Corrie Mckusick, DO [facility]  . IR GENERIC HISTORICAL  04/10/2016   IR EMBO TUMOR ORGAN ISCHEMIA INFARCT INC GUIDE ROADMAPPING 04/10/2016 Corrie Mckusick, DO [facility]  . IR GENERIC HISTORICAL  04/10/2016   IR ANGIOGRAM SELECTIVE EACH ADDITIONAL VESSEL 04/10/2016 Corrie Mckusick, DO [facility]  . IR GENERIC HISTORICAL  04/10/2016   IR ANGIOGRAM SELECTIVE EACH ADDITIONAL VESSEL 04/10/2016 Corrie Mckusick, DO [facility]  . IR GENERIC HISTORICAL  04/10/2016   IR RENAL SELECTIVE  UNI INC S&I MOD SED 04/10/2016 Corrie Mckusick, DO [facility]  . IR GENERIC HISTORICAL  03/06/2016   IR RADIOLOGIST EVAL & MGMT 03/06/2016 Corrie Mckusick, DO [facility]  . IR GENERIC HISTORICAL  03/26/2016   IR RADIOLOGIST EVAL &  MGMT 03/26/2016 Corrie Mckusick, DO [facility]  . IR GENERIC HISTORICAL  04/29/2016   IR RADIOLOGIST EVAL & MGMT 04/29/2016 [facility]  . IR RADIOLOGIST EVAL & MGMT  11/12/2016  . IR RADIOLOGIST EVAL & MGMT  12/16/2017  . KNEE SURGERY    . TONSILLECTOMY       OB History   No obstetric history on file.     Family History  Problem Relation Age of Onset  . Alzheimer's disease Mother   . Prostate cancer Father   . Brain cancer Brother   . Brain cancer Brother   . Pancreatic cancer Sister   . Allergic rhinitis Neg Hx   . Asthma Neg Hx   . Eczema Neg Hx   . Urticaria Neg Hx     Social History   Tobacco Use  . Smoking status: Never Smoker  . Smokeless tobacco: Never Used  Substance Use Topics  . Alcohol use: No  . Drug use: No    Home Medications Prior to Admission medications   Medication Sig Start Date End Date Taking? Authorizing Provider  acetaminophen (TYLENOL) 325 MG tablet Take 2 tablets (650 mg total) by mouth every 6 (six) hours as needed for mild pain (or Fever >/= 101). 02/08/16  Yes Johnson, Clanford L, MD  albuterol (VENTOLIN HFA) 108 (90 Base) MCG/ACT inhaler Inhale 1-2 puffs into the lungs every 4 (four) hours as needed for wheezing or shortness of breath. 06/15/19  Yes Padgett, Rae Halsted, MD  amLODipine (NORVASC) 10 MG tablet TAKE 1 TABLET BY MOUTH ONCE DAILY Patient taking differently: Take 10 mg by mouth daily.  10/29/18  Yes Glendale Chard, MD  Ascorbic Acid (VITAMIN C) 1000 MG tablet Take 1,000 mg by mouth daily.   Yes [provider]  aspirin EC 81 MG tablet Take 81 mg by mouth daily.   Yes [provider]  azelastine (ASTELIN) 0.1 % nasal spray Place 2 sprays into both nostrils 2 (two) times daily. Use in each nostril as directed Patient taking differently: Place 2 sprays into both nostrils daily as needed for allergies. Use in each nostril as directed 10/06/18  Yes Padgett, Rae Halsted, MD  Carboxymethylcellul-Glycerin  (LUBRICATING EYE DROPS OP) Apply 1 drop to eye daily as needed (dry eyes).    Yes [provider]  dicyclomine (BENTYL) 10 MG capsule Take 1 capsule (10 mg total) by mouth 2 (two) times daily. 06/06/19  Yes Zehr, Laban Emperor, PA-C  Fexofenadine HCl (ALLEGRA PO) Take 180 mg by mouth at bedtime.    Yes [provider]  irbesartan (AVAPRO) 150 MG tablet TAKE 1 TABLET BY MOUTH EVERY DAY. Patient taking differently: Take 150 mg by mouth daily.  03/17/19  Yes Glendale Chard, MD  loperamide (IMODIUM) 2 MG capsule Take 1 capsule (2 mg total) by mouth 4 (four) times daily as needed for diarrhea or loose stools. 02/13/19  Yes Couture, Cortni S, PA-C  Multiple Vitamins-Minerals (STRESS TAB NF PO) Take 1 tablet by mouth daily.   Yes [provider]  olopatadine (PATANOL)  0.1 % ophthalmic solution Place 1 drop into both eyes 2 (two) times daily. 06/15/19  Yes Padgett, Rae Halsted, MD  omeprazole (PRILOSEC) 20 MG capsule TAKE 1 CAPSULE BY MOUTH ONCE DAILY 30 MINUTES TO 1 HOUR BEFORE A MEAL Patient taking differently: Take 20 mg by mouth daily.  08/08/19  Yes Glendale Chard, MD  ondansetron (ZOFRAN) 8 MG tablet Take 1 tablet (8 mg total) by mouth every 8 (eight) hours as needed for nausea or vomiting. 07/16/19  Yes Daleen Bo, MD  rosuvastatin (CRESTOR) 20 MG tablet TAKE 1 TABLET BY MOUTH ONCE DAILY Patient taking differently: Take 20 mg by mouth daily.  10/29/18  Yes Glendale Chard, MD  trolamine salicylate (ASPERCREME) 10 % cream Apply 1 application topically as needed for muscle pain.   Yes [provider]  vitamin B-12 (CYANOCOBALAMIN) 1000 MCG tablet Take 1,000 mcg by mouth daily.   Yes [provider]  glucose blood (ONETOUCH VERIO) test strip 1 each by Other route as needed for other. Use as instructed to check blood pressure 2 times per day dx: e11.65 10/25/18   Glendale Chard, MD    Allergies    Amoxicillin, Ampicillin, and Sulfa antibiotics  Review of  Systems   Review of Systems  All other systems reviewed and are negative.   Physical Exam Updated Vital Signs BP (!) 149/96   Pulse (!) 107   Temp 98.6 F (37 C)   Resp 15   LMP  (LMP Unknown)   SpO2 99%   Physical Exam Vitals and nursing note reviewed.  Constitutional:      General: She is not in acute distress.    Appearance: She is well-developed.  HENT:     Head: Normocephalic and atraumatic.  Eyes:     Conjunctiva/sclera: Conjunctivae normal.  Cardiovascular:     Rate and Rhythm: Regular rhythm. Tachycardia present.  Pulmonary:     Effort: Pulmonary effort is normal. No respiratory distress.     Breath sounds: Normal breath sounds.  Abdominal:     General: Bowel sounds are normal.     Palpations: Abdomen is soft.     Tenderness: There is no abdominal tenderness. There is no guarding or rebound.  Skin:    General: Skin is warm.  Neurological:     Mental Status: She is alert.  Psychiatric:        Behavior: Behavior normal.     ED Results / Procedures / Treatments   Labs (all labs ordered are listed, but only abnormal results are displayed) Labs Reviewed  COMPREHENSIVE METABOLIC PANEL - Abnormal; Notable for the following components:      Result Value   Potassium 2.8 (*)    Glucose, Bld 128 (*)    Creatinine, Ser 1.20 (*)    Total Protein 8.2 (*)    GFR calc non Af Amer 42 (*)    GFR calc Af Amer 49 (*)    Anion gap 17 (*)    All other components within normal limits  CBC WITH DIFFERENTIAL/PLATELET - Abnormal; Notable for the following components:   WBC 21.5 (*)    Neutro Abs 19.0 (*)    Abs Immature Granulocytes 0.12 (*)    All other components within normal limits  LACTIC ACID, PLASMA  MAGNESIUM  LACTIC ACID, PLASMA    EKG EKG Interpretation  Date/Time:  Thursday August 11 2019 15:08:08 EST Ventricular Rate:  106 PR Interval:    QRS Duration: 82 QT Interval:  439 QTC Calculation: 584  R Axis:   49 Text Interpretation: Junctional  tachycardia Minimal ST depression, anterolateral leads Prolonged QT interval Baseline wander in lead(s) V3 Confirmed by Sherwood Gambler [phone]) on 08/11/2019 3:17:31 PM   Radiology No results found.  Procedures Procedures (including critical care time)  Medications Ordered in ED Medications  potassium chloride 10 mEq in 100 mL IVPB (10 mEq Intravenous New Bag/Given 08/11/19 1509)  metoCLOPramide (REGLAN) injection 10 mg (has no administration in time range)  sodium chloride 0.9 % bolus 1,000 mL (0 mLs Intravenous Stopped 08/11/19 1507)  ondansetron (ZOFRAN) injection 4 mg (4 mg Intravenous Given 08/11/19 1257)  potassium chloride SA (KLOR-CON) CR tablet 40 mEq (20 mEq Oral Given 08/11/19 1510)    ED Course  I have reviewed the triage vital signs and the nursing notes.  Pertinent labs & imaging results that were available during my care of the patient were reviewed by me and considered in my medical decision making (see chart for details).    MDM Rules/Calculators/A&P                      Patient with recurrent episodes of nausea vomiting and diarrhea, felt to be secondary to IBS per GI, presenting with similar symptoms.  She states she had a handful of pecans last night and thinks this caused her symptoms.  She is only complaining of intermittent abdominal pain that is prior to a bowel movement however is not currently having any pain here.  On arrival she is afebrile though tachycardic.  Abdominal exam is benign.  She is overall well-appearing and in no distress.  Labs obtained, IV fluids and antiemetics administered.  Labs with leukocytosis and hypokalemia of 2.8.  Will replace. Magnesium is within normal limits. EKG with prolonged QTc 584.  Lactic is wnl. Discussed with and evaluated by Dr. Regenia Skeeter. Pt did not tolerate PO potassium and vomited soon after taking. Will continue to monitor, pending IV potassium replacement. Dr. Regenia Skeeter recommends reglan for nausea/vomiting. Care assumed at  shift change by PA Green pending re-evaluation. If tolerating PO and QT improves, consider discharge, otherwise pt may need admission for further management.  Final Clinical Impression(s) / ED Diagnoses Final diagnoses:  Nausea vomiting and diarrhea  Hypokalemia  Prolonged Q-T interval on ECG    Rx / DC Orders ED Discharge Orders    None       Lorenzo Pereyra, [place] N, PA-C 08/11/19 1530    Lacole Komorowski, [place] N, PA-C 08/11/19 Livingston Wheeler, MD 08/13/19 [phone]

## 2019-08-12 DIAGNOSIS — K529 Noninfective gastroenteritis and colitis, unspecified: Secondary | ICD-10-CM | POA: Diagnosis not present

## 2019-08-12 LAB — BASIC METABOLIC PANEL
Anion gap: 10 (ref 5–15)
BUN: 9 mg/dL (ref 8–23)
CO2: 22 mmol/L (ref 22–32)
Calcium: 8.4 mg/dL — ABNORMAL LOW (ref 8.9–10.3)
Chloride: 107 mmol/L (ref 98–111)
Creatinine, Ser: 0.87 mg/dL (ref 0.44–1.00)
GFR calc Af Amer: >60 mL/min (ref >60)
GFR calc non Af Amer: >60 mL/min (ref >60)
Glucose, Bld: 83 mg/dL (ref 70–99)
Potassium: 3.2 mmol/L — ABNORMAL LOW (ref 3.5–5.1)
Sodium: 139 mmol/L (ref 135–145)

## 2019-08-12 LAB — CBC
HCT: 35.4 % — ABNORMAL LOW (ref 36.0–46.0)
Hemoglobin: 11 g/dL — ABNORMAL LOW (ref 12.0–15.0)
MCH: 29 pg (ref 26.0–34.0)
MCHC: 31.1 g/dL (ref 30.0–36.0)
MCV: 93.4 fL (ref 80.0–100.0)
Platelets: 308 10*3/uL (ref 150–400)
RBC: 3.79 MIL/uL — ABNORMAL LOW (ref 3.87–5.11)
RDW: 14.3 % (ref 11.5–15.5)
WBC: 9.4 10*3/uL (ref 4.0–10.5)
nRBC: 0 % (ref 0.0–0.2)

## 2019-08-12 LAB — SARS CORONAVIRUS 2 (TAT 6-24 HRS): SARS Coronavirus 2: NEGATIVE

## 2019-08-12 LAB — MAGNESIUM: Magnesium: 1.7 mg/dL (ref 1.7–2.4)

## 2019-08-12 MED ORDER — POTASSIUM CHLORIDE 20 MEQ/15ML (10%) PO SOLN: 40.0000 meq | ORAL | Status: DC

## 2019-08-12 MED ORDER — CEFIXIME 100 MG/5ML PO SUSR: 400.0000 mg | Freq: Every day | ORAL | 0 refills | Status: DC

## 2019-08-12 MED ORDER — POTASSIUM CHLORIDE CRYS ER 20 MEQ PO TBCR
40.0000 meq | EXTENDED_RELEASE_TABLET | Freq: Once | ORAL | Status: AC
  Administered 2019-08-12: 40 meq via ORAL
  Filled 2019-08-12: qty 2

## 2019-08-12 MED ORDER — METRONIDAZOLE 500 MG PO TABS: 500.0000 mg | ORAL_TABLET | Freq: Three times a day (TID) | ORAL | 0 refills | Status: AC

## 2019-08-12 MED ORDER — CEPHALEXIN 500 MG PO CAPS: 500.0000 mg | ORAL_CAPSULE | Freq: Four times a day (QID) | ORAL | 0 refills | Status: AC

## 2019-08-12 MED ORDER — POTASSIUM CHLORIDE 20 MEQ/15ML (10%) PO SOLN
40.0000 meq | ORAL | Status: AC
  Administered 2019-08-12: 40 meq via ORAL
  Filled 2019-08-12: qty 30

## 2019-08-12 MED FILL — CEPHALEXIN 500 MG CAPSULE: 500 | 9 days supply | Qty: 36 | Fill #0

## 2019-08-12 MED FILL — METRONIDAZOLE 500 MG TABS: 500 | 9 days supply | Qty: 27 | Fill #0

## 2019-08-12 NOTE — ED Notes (Signed)
Patient assisted to the restroom, urinated on the floor before she could make it there, given new panties and a pad for episodes of functional incontinence. Patient denies having any BM in the last day, so unable to collect a stool sample at this time.

## 2019-08-12 NOTE — Discharge Summary (Signed)
Physician Discharge Summary  Anna Hoffman [facility] NTI:[id number] DOB: Jun 15, 1935 DOA: 08/11/2019  PCP: Glendale Chard, MD  Admit date: 08/11/2019 Discharge date: 08/12/2019  Admitted From: Home  Disposition: Home  Recommendations for Outpatient Follow-up:  1. Follow up with PCP in 1-2 weeks 2. Continue ABX, keflex and flagyl, for 9 more days to complete treatment for colitis/diverticulitis 3. Follow-up with planned outpatient colonoscopy as indicated 4. Recheck potassium and replete as needed 5. Repeat EKG and check QTC, be careful with QTC prolonging medications 6. Please obtain BMP/CBC in one week 7. Please follow up on the following pending results: Stool culture  Home Health:NO Equipment/Devices:NO  Discharge Condition:Stable  CODE STATUS:Full Diet recommendation:Heart healthy   Brief/Interim Summary: Anna Hoffman is a 85 y.o. female with medical history significant of hypertension previous diagnosed with diverticulitis approximately month ago for which she could not finish her antibiotics because she cannot vomiting.  She got better and then approximately 2 to 3 days ago she started having right lower quadrant abdominal pain associated with vomiting and diarrhea again.  She seen GI symptoms have been attributed to IBS.  She has an outpatient colonoscopy scheduled.  Patient denies any fevers.  Vomiting and diarrhea have been nonbloody.  Patient be referred for admission for a potassium level 2.8.  Abdominal exam is benign.  CT shows colitis.  She denies any Covid symptoms respiratory wise.  Tolerated IV CTX and flagyl without any issues or N/V. Ate her food without issues. No abdominal pain or fever. Will transition to PO and discharge. Given IV and PO K replacement. Otherwise stable for discharge.  Discharge Diagnoses:  Principal Problem:   Colitis Active Problems:   Hypokalemia   Benign essential HTN   Chronic renal disease, stage III   Colitis, acute    Discharge  Instructions   Allergies as of 08/12/2019      Reactions   Amoxicillin Diarrhea   Has patient had a PCN reaction causing immediate rash, facial/tongue/throat swelling, SOB or lightheadedness with hypotension: no Has patient had a PCN reaction causing severe rash involving mucus membranes or skin necrosis: no Has patient had a PCN reaction that required hospitalization: no pharmacist consult Has patient had a PCN reaction occurring within the last 10 years: yes If all of the above answers are "NO", then may proceed with Cephalosporin use.   Ampicillin Rash   - Tolerates Rocephin and Ancef - Remote occurrence; no symptoms of anaphylaxis or severe cutaneous reaction, and no additional medical attention required   Sulfa Antibiotics Rash      Medication List    STOP taking these medications   dicyclomine 10 MG capsule Commonly known as: BENTYL   loperamide 2 MG capsule Commonly known as: IMODIUM   omeprazole 20 MG capsule Commonly known as: PRILOSEC   ondansetron 8 MG tablet Commonly known as: Zofran     TAKE these medications   acetaminophen 325 MG tablet Commonly known as: TYLENOL Take 2 tablets (650 mg total) by mouth every 6 (six) hours as needed for mild pain (or Fever >/= 101).   albuterol 108 (90 Base) MCG/ACT inhaler Commonly known as: VENTOLIN HFA Inhale 1-2 puffs into the lungs every 4 (four) hours as needed for wheezing or shortness of breath.   ALLEGRA PO Take 180 mg by mouth at bedtime.   amLODipine 10 MG tablet Commonly known as: NORVASC TAKE 1 TABLET BY MOUTH ONCE DAILY   aspirin EC 81 MG tablet Take 81 mg by mouth daily.  azelastine 0.1 % nasal spray Commonly known as: ASTELIN Place 2 sprays into both nostrils 2 (two) times daily. Use in each nostril as directed What changed:   when to take this  reasons to take this   cephALEXin 500 MG capsule Commonly known as: KEFLEX Take 1 capsule (500 mg total) by mouth 4 (four) times daily for 9  days. Start taking on: August 13, 2019   glucose blood test strip Commonly known as: OneTouch Verio 1 each by Other route as needed for other. Use as instructed to check blood pressure 2 times per day dx: e11.65   irbesartan 150 MG tablet Commonly known as: AVAPRO TAKE 1 TABLET BY MOUTH EVERY DAY.   LUBRICATING EYE DROPS OP Apply 1 drop to eye daily as needed (dry eyes).   metroNIDAZOLE 500 MG tablet Commonly known as: Flagyl Take 1 tablet (500 mg total) by mouth 3 (three) times daily for 9 days.   olopatadine 0.1 % ophthalmic solution Commonly known as: Patanol Place 1 drop into both eyes 2 (two) times daily.   rosuvastatin 20 MG tablet Commonly known as: CRESTOR TAKE 1 TABLET BY MOUTH ONCE DAILY   STRESS TAB NF PO Take 1 tablet by mouth daily.   trolamine salicylate 10 % cream Commonly known as: ASPERCREME Apply 1 application topically as needed for muscle pain.   vitamin B-12 1000 MCG tablet Commonly known as: CYANOCOBALAMIN Take 1,000 mcg by mouth daily.   vitamin C 1000 MG tablet Take 1,000 mg by mouth daily.       Allergies  Allergen Reactions  . Amoxicillin Diarrhea    Has patient had a PCN reaction causing immediate rash, facial/tongue/throat swelling, SOB or lightheadedness with hypotension: no Has patient had a PCN reaction causing severe rash involving mucus membranes or skin necrosis: no Has patient had a PCN reaction that required hospitalization: no pharmacist consult Has patient had a PCN reaction occurring within the last 10 years: yes If all of the above answers are "NO", then may proceed with Cephalosporin use.   . Ampicillin Rash    - Tolerates Rocephin and Ancef - Remote occurrence; no symptoms of anaphylaxis or severe cutaneous reaction, and no additional medical attention required    . Sulfa Antibiotics Rash    Consultations:  None   Procedures/Studies: CT ABDOMEN PELVIS W CONTRAST  Result Date: 08/11/2019 CLINICAL DATA:   Vomiting. EXAM: CT ABDOMEN AND PELVIS WITH CONTRAST TECHNIQUE: Multidetector CT imaging of the abdomen and pelvis was performed using the standard protocol following bolus administration of intravenous contrast. CONTRAST:  2mL OMNIPAQUE IOHEXOL 300 MG/ML  SOLN COMPARISON:  July 11, 2019 FINDINGS: Lower chest: No acute abnormality. Hepatobiliary: No focal liver abnormality is seen. The gallbladder is moderately distended. No gallstones, gallbladder wall thickening, or biliary dilatation. Pancreas: Unremarkable. No pancreatic ductal dilatation or surrounding inflammatory changes. Spleen: Normal in size without focal abnormality. Adrenals/Urinary Tract: Adrenal glands are unremarkable. Kidneys are normal in size, without renal calculi or hydronephrosis. A 3.2 cm simple cyst is seen within the lower pole of the left kidney. Additional subcentimeter bilateral renal cysts are noted. A stable, approximately 2.5 cm x 1.5 cm predominately fat containing mass is again seen within the anterior aspect of the mid left kidney. Bladder is unremarkable. Stomach/Bowel: There is a large hiatal hernia. The appendix is not clearly identified. No evidence of bowel wall distention. Numerous diverticula are seen throughout the sigmoid colon. There is persistent moderate severity thickening of the proximal sigmoid colon. No significant  pericolonic inflammatory fat stranding is seen. Vascular/Lymphatic: Marked severity aortic atherosclerosis. No enlarged abdominal or pelvic lymph nodes. Reproductive: Status post hysterectomy. No adnexal masses. Other: No abdominal wall hernia or abnormality. No abdominopelvic ascites. Musculoskeletal: No acute or significant osseous findings. IMPRESSION: 1. Stable moderate severity thickening of the proximal sigmoid colon which is unchanged in appearance when compared to the prior study dated July 11, 2019. While this may represent sequelae associated with colitis and/or diverticulitis, further  evaluation is recommended to exclude the presence of an underlying neoplastic process. 2. Large, stable gastric hernia. 3. 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 Electronically Signed   By: Virgina Norfolk M.D.   On: 08/11/2019 19:12      Subjective: No acute events overnight. Ate all her breakfast and her lunch. Stated she did not finish her last round of ABX due to nausea. Has had extensive QTC prolonging medications including immodium, flagyl, cipro, zofran, phenergan. QTC of above 500. No current issues, gagged on the potassium liquid so will cut kdur in half and give to her. Otherwise wants to go home. Tolerated IV CTX and flagyl here.   Discharge Exam: Vitals:   08/12/19 1300 08/12/19 1513  BP: 138/72 (!) 153/88  Pulse: 93 89  Resp: 18 16  Temp: 98.4 F (36.9 C) 99.3 F (37.4 C)  SpO2: 95% 97%   Vitals:   08/12/19 1222 08/12/19 1230 08/12/19 1300 08/12/19 1513  BP: (!) 146/81 138/68 138/72 (!) 153/88  Pulse: 98 90 93 89  Resp: 16 20 18 16   Temp:   98.4 F (36.9 C) 99.3 F (37.4 C)  TempSrc:   Oral Oral  SpO2: 97% 96% 95% 97%    General: PERRL, lids and conjunctivae normal, Mucous membranes are moist. Normal dentition.  Respiratory: clear to auscultation bilaterally, no wheezing, no crackles. Normal respiratory effort. No accessory muscle use.  Cardiovascular: Regular rate and rhythm, No extremity edema. 2+ pedal pulses.  Abdomen: mild tenderness in RLQ, no masses palpated. No hepatosplenomegaly. Bowel sounds positive.  Musculoskeletal: No joint deformity upper and lower extremities. Good ROM, no contractures. Normal muscle tone.  Skin: no rashes, lesions, ulcers. No induration Neurologic: CN 2-12 grossly intact. Sensation intact, Strength 5/5 in all 4.  Psychiatric: Normal judgment and insight. Alert and oriented x 3. Normal mood.   The results of significant diagnostics from this hospitalization  (including imaging, microbiology, ancillary and laboratory) are listed below for reference.     Microbiology: Recent Results (from the past 240 hour(s))  SARS CORONAVIRUS 2 (TAT 6-24 HRS) Nasopharyngeal Nasopharyngeal Swab     Status: None   Collection Time: 08/11/19  9:13 PM   Specimen: Nasopharyngeal Swab  Result Value Ref Range Status   SARS Coronavirus 2 NEGATIVE NEGATIVE Final    Comment: (NOTE) SARS-CoV-2 target nucleic acids are NOT DETECTED. The SARS-CoV-2 RNA is generally detectable in upper and lower respiratory specimens during the acute phase of infection. Negative results do not preclude SARS-CoV-2 infection, do not rule out co-infections with other pathogens, and should not be used as the sole basis for treatment or other patient management decisions. Negative results must be combined with clinical observations, patient history, and epidemiological information. The expected result is Negative. Fact Sheet for Patients: SugarRoll.be Fact Sheet for Healthcare Providers: https://www.woods-mathews.com/ This test is not yet approved or cleared by the [place] FDA and  has been authorized for detection and/or diagnosis of SARS-CoV-2 by FDA under an Emergency Use  Authorization (EUA). This EUA will remain  in effect (meaning this test can be used) for the duration of the COVID-19 declaration under Section 56 4(b)(1) of the Act, 21 U.S.C. section 360bbb-3(b)(1), unless the authorization is terminated or revoked sooner. Performed at [facility], [place] [address], [place], [place] [address]      Labs: BNP (last 3 results) Recent Labs    04/06/19 1528  BNP 267.1*   Basic Metabolic Panel: Recent Labs  Lab 08/09/19 1633 08/11/19 1258 08/12/19 0522  NA 144 142 139  K 4.1 2.8* 3.2*  CL 103 100 107  CO2 24 25 22   GLUCOSE 90 128* 83  BUN 18 17 9   CREATININE 1.15* 1.20* 0.87  CALCIUM 9.9 9.5 8.4*  MG  --  2.2 1.7    Liver Function Tests: Recent Labs  Lab 08/11/19 1258  AST 28  ALT 20  ALKPHOS 82  BILITOT 0.6  PROT 8.2*  ALBUMIN 4.9   No results for input(s): LIPASE, AMYLASE in the last 168 hours. No results for input(s): AMMONIA in the last 168 hours. CBC: Recent Labs  Lab 08/11/19 1258 08/12/19 0522  WBC 21.5* 9.4  NEUTROABS 19.0*  --   HGB 14.1 11.0*  HCT 44.3 35.4*  MCV 91.9 93.4  PLT 375 308   Cardiac Enzymes: No results for input(s): CKTOTAL, CKMB, CKMBINDEX, TROPONINI in the last 168 hours. BNP: Invalid input(s): POCBNP CBG: No results for input(s): GLUCAP in the last 168 hours. D-Dimer No results for input(s): DDIMER in the last 72 hours. Hgb A1c No results for input(s): HGBA1C in the last 72 hours. Lipid Profile No results for input(s): CHOL, HDL, LDLCALC, TRIG, CHOLHDL, LDLDIRECT in the last 72 hours. Thyroid function studies No results for input(s): TSH, T4TOTAL, T3FREE, THYROIDAB in the last 72 hours.  Invalid input(s): FREET3 Anemia work up No results for input(s): VITAMINB12, FOLATE, FERRITIN, TIBC, IRON, RETICCTPCT in the last 72 hours. Urinalysis    Component Value Date/Time   COLORURINE YELLOW 08/11/2019 2009   APPEARANCEUR CLEAR 08/11/2019 2009   LABSPEC 1.019 08/11/2019 2009   PHURINE 7.0 08/11/2019 2009   GLUCOSEU NEGATIVE 08/11/2019 2009   HGBUR SMALL (A) 08/11/2019 2009   BILIRUBINUR NEGATIVE 08/11/2019 2009   KETONESUR 5 (A) 08/11/2019 2009   PROTEINUR 30 (A) 08/11/2019 2009   UROBILINOGEN 0.2 09/01/2014 0300   NITRITE NEGATIVE 08/11/2019 2009   LEUKOCYTESUR NEGATIVE 08/11/2019 2009   Sepsis Labs Invalid input(s): PROCALCITONIN,  WBC,  LACTICIDVEN Microbiology Recent Results (from the past 240 hour(s))  SARS CORONAVIRUS 2 (TAT 6-24 HRS) Nasopharyngeal Nasopharyngeal Swab     Status: None   Collection Time: 08/11/19  9:13 PM   Specimen: Nasopharyngeal Swab  Result Value Ref Range Status   SARS Coronavirus 2 NEGATIVE NEGATIVE Final     Comment: (NOTE) SARS-CoV-2 target nucleic acids are NOT DETECTED. The SARS-CoV-2 RNA is generally detectable in upper and lower respiratory specimens during the acute phase of infection. Negative results do not preclude SARS-CoV-2 infection, do not rule out co-infections with other pathogens, and should not be used as the sole basis for treatment or other patient management decisions. Negative results must be combined with clinical observations, patient history, and epidemiological information. The expected result is Negative. Fact Sheet for Patients: SugarRoll.be Fact Sheet for Healthcare Providers: https://www.woods-mathews.com/ This test is not yet approved or cleared by the [place] FDA and  has been authorized for detection and/or diagnosis of SARS-CoV-2 by FDA under an Emergency Use Authorization (  EUA). This EUA will remain  in effect (meaning this test can be used) for the duration of the COVID-19 declaration under Section 56 4(b)(1) of the Act, 21 U.S.C. section 360bbb-3(b)(1), unless the authorization is terminated or revoked sooner. Performed at [facility], [place] [address], [place], [place] [address]      Time coordinating discharge: Over 30 minutes  SIGNED:   Arma Heading, MD  [facility] 08/12/2019, 6:27 PM Pager   If 7PM-7AM, please contact night-coverage [email or web address] Password [email or web address]

## 2019-08-12 NOTE — ED Notes (Signed)
ED TO INPATIENT HANDOFF REPORT  ED Nurse Name and Phone #:  [place] G, [phone]  S Name/Age/Gender Anna Hoffman 85 y.o. female Room/Bed: WA02/WA02  Code Status   Code Status: Full Code  Home/SNF/Other Home Patient oriented to: self, place, time and situation Is this baseline? Yes   Triage Complete: Triage complete  Chief Complaint Colitis, acute [K52.9] Colitis [K52.9]  Triage Note Pt states that she has been having emesis x 2 today and multiple episodes of emesis. Pt states that it started yesterday, similar to what she experienced 3 weeks ago. Denies CP/SHOB.    Allergies Allergies  Allergen Reactions  . Amoxicillin Diarrhea    Has patient had a PCN reaction causing immediate rash, facial/tongue/throat swelling, SOB or lightheadedness with hypotension: no Has patient had a PCN reaction causing severe rash involving mucus membranes or skin necrosis: no Has patient had a PCN reaction that required hospitalization: no pharmacist consult Has patient had a PCN reaction occurring within the last 10 years: yes If all of the above answers are "NO", then may proceed with Cephalosporin use.   . Ampicillin Rash    - Tolerates Rocephin and Ancef - Remote occurrence; no symptoms of anaphylaxis or severe cutaneous reaction, and no additional medical attention required    . Sulfa Antibiotics Rash    Level of Care/Admitting Diagnosis ED Disposition    ED Disposition Condition Comment   Admit  Hospital Area: [facility] [[id number]]  Level of Care: Med-Surg [[id number]]  Covid Evaluation: Confirmed COVID Negative  Date Laboratory Confirmed COVID Negative: 08/12/2019  Diagnosis: Colitis [[id number]]  Admitting Physician: Phillips Grout [[id number]]  Attending Physician: Derrill Kay A [[id number]]       B Medical/Surgery History Past Medical History:  Diagnosis Date  . Arthritis   . Diabetes mellitus without complication ([place])   . Hypertension   . Hypokalemia   .  Pneumonia   . Shingles   . Stroke [facility])    Past Surgical History:  Procedure Laterality Date  . ABDOMINAL HYSTERECTOMY    . BREAST EXCISIONAL BIOPSY Left   . ESOPHAGOGASTRODUODENOSCOPY N/A 09/01/2014   Procedure: ESOPHAGOGASTRODUODENOSCOPY (EGD);  Surgeon: Lear Ng, MD;  Location: [facility];  Service: Endoscopy;  Laterality: N/A;  . HIATAL HERNIA REPAIR N/A 09/04/2014   Procedure: LAPAROSCOPIC REPAIR OF HIATAL HERNIA;  Surgeon: Excell Seltzer, MD;  Location: [facility];  Service: General;  Laterality: N/A;  With MESH  . IR GENERIC HISTORICAL  04/10/2016   IR US GUIDE VASC ACCESS RIGHT 04/10/2016 Corrie Mckusick, DO [facility]  . IR GENERIC HISTORICAL  04/10/2016   IR ANGIOGRAM SELECTIVE EACH ADDITIONAL VESSEL 04/10/2016 Corrie Mckusick, DO [facility]  . IR GENERIC HISTORICAL  04/10/2016   IR ANGIOGRAM SELECTIVE EACH ADDITIONAL VESSEL 04/10/2016 Corrie Mckusick, DO [facility]  . IR GENERIC HISTORICAL  04/10/2016   IR EMBO TUMOR ORGAN ISCHEMIA INFARCT INC GUIDE ROADMAPPING 04/10/2016 Corrie Mckusick, DO [facility]  . IR GENERIC HISTORICAL  04/10/2016   IR ANGIOGRAM SELECTIVE EACH ADDITIONAL VESSEL 04/10/2016 Corrie Mckusick, DO [facility]  . IR GENERIC HISTORICAL  04/10/2016   IR ANGIOGRAM SELECTIVE EACH ADDITIONAL VESSEL 04/10/2016 Corrie Mckusick, DO [facility]  . IR GENERIC HISTORICAL  04/10/2016   IR RENAL SELECTIVE  UNI INC S&I MOD SED 04/10/2016 Corrie Mckusick, DO [facility]  . IR GENERIC HISTORICAL  03/06/2016   IR RADIOLOGIST EVAL & MGMT 03/06/2016 Corrie Mckusick, DO [facility]  . IR GENERIC HISTORICAL  03/26/2016  IR RADIOLOGIST EVAL & MGMT 03/26/2016 Corrie Mckusick, DO [facility]  . IR GENERIC HISTORICAL  04/29/2016   IR RADIOLOGIST EVAL & MGMT 04/29/2016 [facility]  . IR RADIOLOGIST EVAL & MGMT  11/12/2016  . IR RADIOLOGIST EVAL & MGMT  12/16/2017  . KNEE SURGERY    . TONSILLECTOMY       A IV Location/Drains/Wounds Patient Lines/Drains/Airways Status   Active  Line/Drains/Airways    Name:   Placement date:   Placement time:   Site:   Days:   Peripheral IV 07/16/19 Left;Upper Forearm   07/16/19    2120    Forearm   27   Peripheral IV 08/11/19 Right Forearm   08/11/19    1252    Forearm   1   Incision (Closed) 09/04/14 Abdomen   09/04/14    1503     1803   Incision - 6 Ports Abdomen Right;Lateral Right;Medial Left;Medial Left;Medial;Lateral Left;Lateral Mid;Upper   09/04/14    1455     1803          Intake/Output Last 24 hours  Intake/Output Summary (Last 24 hours) at 08/12/2019 1348 Last data filed at 08/12/2019 1258 Gross per 24 hour  Intake 1296.68 ml  Output --  Net 1296.68 ml    Labs/Imaging Results for orders placed or performed during the hospital encounter of 08/11/19 (from the past 48 hour(s))  Comprehensive metabolic panel     Status: Abnormal   Collection Time: 08/11/19 12:58 PM  Result Value Ref Range   Sodium 142 135 - 145 mmol/L   Potassium 2.8 (L) 3.5 - 5.1 mmol/L   Chloride 100 98 - 111 mmol/L   CO2 25 22 - 32 mmol/L   Glucose, Bld 128 (H) 70 - 99 mg/dL   BUN 17 8 - 23 mg/dL   Creatinine, Ser 1.20 (H) 0.44 - 1.00 mg/dL   Calcium 9.5 8.9 - 10.3 mg/dL   Total Protein 8.2 (H) 6.5 - 8.1 g/dL   Albumin 4.9 3.5 - 5.0 g/dL   AST 28 15 - 41 U/L   ALT 20 0 - 44 U/L   Alkaline Phosphatase 82 38 - 126 U/L   Total Bilirubin 0.6 0.3 - 1.2 mg/dL   GFR calc non Af Amer 42 (L) >60 mL/min   GFR calc Af Amer 49 (L) >60 mL/min   Anion gap 17 (H) 5 - 15    Comment: Performed at [facility], [place] [address] [place], [place] [address]  CBC with Differential     Status: Abnormal   Collection Time: 08/11/19 12:58 PM  Result Value Ref Range   WBC 21.5 (H) 4.0 - 10.5 K/uL   RBC 4.82 3.87 - 5.11 MIL/uL   Hemoglobin 14.1 12.0 - 15.0 g/dL   HCT 44.3 36.0 - 46.0 %   MCV 91.9 80.0 - 100.0 fL   MCH 29.3 26.0 - 34.0 pg   MCHC 31.8 30.0 - 36.0 g/dL   RDW 14.1 11.5 - 15.5 %   Platelets 375 150 - 400 K/uL   nRBC 0.0 0.0 -  0.2 %   Neutrophils Relative % 88 %   Neutro Abs 19.0 (H) 1.7 - 7.7 K/uL   Lymphocytes Relative 6 %   Lymphs Abs 1.3 0.7 - 4.0 K/uL   Monocytes Relative 5 %   Monocytes Absolute 1.0 0.1 - 1.0 K/uL   Eosinophils Relative 0 %   Eosinophils Absolute 0.0 0.0 - 0.5 K/uL   Basophils  Relative 0 %   Basophils Absolute 0.1 0.0 - 0.1 K/uL   Immature Granulocytes 1 %   Abs Immature Granulocytes 0.12 (H) 0.00 - 0.07 K/uL    Comment: Performed at [facility], [place] [address] [place], [place] [address]  Lactic acid, plasma     Status: None   Collection Time: 08/11/19 12:58 PM  Result Value Ref Range   Lactic Acid, Venous 1.9 0.5 - 1.9 mmol/L    Comment: Performed at [facility], [place] [address], [place], [place] [address]  Magnesium     Status: None   Collection Time: 08/11/19 12:58 PM  Result Value Ref Range   Magnesium 2.2 1.7 - 2.4 mg/dL    Comment: Performed at  [facility], [place] [address], [place], [place] [address]  Lactic acid, plasma     Status: None   Collection Time: 08/11/19  2:02 PM  Result Value Ref Range   Lactic Acid, Venous 1.2 0.5 - 1.9 mmol/L    Comment: Performed at [facility], [place] [address], [place], [place] [address]  Urinalysis, Routine w reflex microscopic     Status: Abnormal   Collection Time: 08/11/19  8:09 PM  Result Value Ref Range   Color, Urine YELLOW YELLOW   APPearance CLEAR CLEAR   Specific Gravity, Urine 1.019 1.005 - 1.030   pH 7.0 5.0 - 8.0   Glucose, UA NEGATIVE NEGATIVE mg/dL   Hgb urine dipstick SMALL (A) NEGATIVE   Bilirubin Urine NEGATIVE NEGATIVE   Ketones, ur 5 (A) NEGATIVE mg/dL   Protein, ur 30 (A) NEGATIVE mg/dL   Nitrite NEGATIVE NEGATIVE   Leukocytes,Ua NEGATIVE NEGATIVE   RBC / HPF 0-5 0 - 5 RBC/hpf   WBC, UA 0-5 0 - 5 WBC/hpf   Bacteria, UA RARE (A) NONE SEEN   Squamous Epithelial / LPF 0-5 0 - 5   Mucus PRESENT     Comment: Performed at [facility], [place] [address], [place], [place] [address]  SARS CORONAVIRUS 2 (TAT 6-24 HRS) Nasopharyngeal Nasopharyngeal Swab     Status: None   Collection Time: 08/11/19  9:13 PM   Specimen: Nasopharyngeal Swab  Result Value Ref Range   SARS Coronavirus 2 NEGATIVE NEGATIVE    Comment: (NOTE) SARS-CoV-2 target nucleic acids are NOT DETECTED. The SARS-CoV-2 RNA is generally detectable in upper and lower respiratory specimens during the acute phase of infection. Negative results do not preclude SARS-CoV-2 infection, do not rule out co-infections with other pathogens, and should not be used as the sole basis for treatment or other patient management decisions. Negative results must be combined with clinical observations, patient history, and epidemiological information. The expected result is Negative. Fact Sheet for Patients: SugarRoll.be Fact Sheet for Healthcare Providers: https://www.woods-mathews.com/ This test is not yet approved or cleared by the [place] FDA and  has been authorized for detection and/or diagnosis of SARS-CoV-2 by FDA under an Emergency Use Authorization (EUA). This EUA will remain  in effect (meaning this test can be used) for the duration of the COVID-19 declaration under Section 56 4(b)(1) of the Act, 21 U.S.C. section 360bbb-3(b)(1), unless the authorization is terminated or revoked sooner. Performed at [facility], [place] [address] [place], [place] [address]   Magnesium     Status: None   Collection Time: 08/12/19  5:22 AM  Result Value Ref Range   Magnesium 1.7 1.7 - 2.4 mg/dL    Comment: Performed at  [facility], [place] [address] [place], [place] [address]  CBC     Status: Abnormal   Collection Time: 08/12/19  5:22 AM  Result Value Ref Range   WBC 9.4 4.0 - 10.5 K/uL   RBC 3.79 (L) 3.87 - 5.11 MIL/uL   Hemoglobin 11.0 (L) 12.0 - 15.0 g/dL   HCT 35.4 (L) 36.0 - 46.0 %    MCV 93.4 80.0 - 100.0 fL   MCH 29.0 26.0 - 34.0 pg   MCHC 31.1 30.0 - 36.0 g/dL   RDW 14.3 11.5 - 15.5 %   Platelets 308 150 - 400 K/uL   nRBC 0.0 0.0 - 0.2 %    Comment: Performed at [facility], [place] [address], [place], [place] [address]  Basic metabolic panel     Status: Abnormal   Collection Time: 08/12/19  5:22 AM  Result Value Ref Range   Sodium 139 135 - 145 mmol/L   Potassium 3.2 (L) 3.5 - 5.1 mmol/L   Chloride 107 98 - 111 mmol/L   CO2 22 22 - 32 mmol/L   Glucose, Bld 83 70 - 99 mg/dL   BUN 9 8 - 23 mg/dL   Creatinine, Ser 0.87 0.44 - 1.00 mg/dL   Calcium 8.4 (L) 8.9 - 10.3 mg/dL   GFR calc non Af Amer >60 >60 mL/min   GFR calc Af Amer >60 >60 mL/min   Anion gap 10 5 - 15    Comment: Performed at [facility], [place] [address] [place], [place] [address]   CT ABDOMEN PELVIS W CONTRAST  Result Date: 08/11/2019 CLINICAL DATA:  Vomiting. EXAM: CT ABDOMEN AND PELVIS WITH CONTRAST TECHNIQUE: Multidetector CT imaging of the abdomen and pelvis was performed using the standard protocol following bolus administration of intravenous contrast. CONTRAST:  4mL OMNIPAQUE IOHEXOL 300 MG/ML  SOLN COMPARISON:  July 11, 2019 FINDINGS: Lower chest: No acute abnormality. Hepatobiliary: No focal liver abnormality is seen. The gallbladder is moderately distended. No gallstones, gallbladder wall thickening, or biliary dilatation. Pancreas: Unremarkable. No pancreatic ductal dilatation or surrounding inflammatory changes. Spleen: Normal in size without focal abnormality. Adrenals/Urinary Tract: Adrenal glands are unremarkable. Kidneys are normal in size, without renal calculi or hydronephrosis. A 3.2 cm simple cyst is seen within the lower pole of the left kidney. Additional subcentimeter bilateral renal cysts are noted. A stable, approximately 2.5 cm x 1.5 cm predominately fat containing mass is again seen within the anterior aspect of the mid left kidney. Bladder  is unremarkable. Stomach/Bowel: There is a large hiatal hernia. The appendix is not clearly identified. No evidence of bowel wall distention. Numerous diverticula are seen throughout the sigmoid colon. There is persistent moderate severity thickening of the proximal sigmoid colon. No significant pericolonic inflammatory fat stranding is seen. Vascular/Lymphatic: Marked severity aortic atherosclerosis. No enlarged abdominal or pelvic lymph nodes. Reproductive: Status post hysterectomy. No adnexal masses. Other: No abdominal wall hernia or abnormality. No abdominopelvic ascites. Musculoskeletal: No acute or significant osseous findings. IMPRESSION: 1. Stable moderate severity thickening of the proximal sigmoid colon which is unchanged in appearance when compared to the prior study dated July 11, 2019. While this may represent sequelae associated with colitis and/or diverticulitis, further evaluation is recommended to exclude the presence of an underlying neoplastic process. 2. Large, stable gastric hernia. 3. 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 Electronically Signed   By: Virgina Norfolk M.D.   On: 08/11/2019 19:12  Pending Labs Unresulted Labs (From admission, onward)    Start     Ordered   08/11/19 2258  Stool culture (children & immunocomp patients)  Once,   STAT     08/11/19 2258          Vitals/Pain Today's Vitals   08/12/19 1143 08/12/19 1222 08/12/19 1230 08/12/19 1300  BP:  (!) 146/81 138/68 138/72  Pulse: 86 98 90 93  Resp: 20 16 20 18   Temp:      TempSrc:      SpO2: 99% 97% 96% 95%  PainSc:        Isolation Precautions No active isolations  Medications Medications  alum & mag hydroxide-simeth (MAALOX/MYLANTA) 200-200-20 MG/5ML suspension 30 mL (30 mLs Oral Refused 08/11/19 1755)  0.9 % NaCl with KCl 40 mEq / L  infusion ( Intravenous Rate/Dose Verify 08/12/19 1258)  metroNIDAZOLE (FLAGYL) IVPB 500 mg  (0 mg Intravenous Stopped 08/12/19 0741)  cefTRIAXone (ROCEPHIN) 2 g in sodium chloride 0.9 % 100 mL IVPB (0 g Intravenous Stopped 08/12/19 0114)  amLODipine (NORVASC) tablet 10 mg (10 mg Oral Given 08/12/19 0927)  albuterol (PROVENTIL) (2.5 MG/3ML) 0.083% nebulizer solution 2.5 mg (has no administration in time range)  aspirin EC tablet 81 mg (81 mg Oral Given 08/12/19 0946)  dicyclomine (BENTYL) capsule 10 mg (10 mg Oral Given 08/12/19 0927)  rosuvastatin (CRESTOR) tablet 20 mg (20 mg Oral Given 08/12/19 0928)  promethazine (PHENERGAN) injection 12.5 mg (has no administration in time range)    Or  promethazine (PHENERGAN) tablet 12.5 mg (has no administration in time range)  sodium chloride 0.9 % bolus 1,000 mL (0 mLs Intravenous Stopped 08/11/19 1507)  ondansetron (ZOFRAN) injection 4 mg (4 mg Intravenous Given 08/11/19 1257)  potassium chloride 10 mEq in 100 mL IVPB (0 mEq Intravenous Stopped 08/11/19 1933)  potassium chloride SA (KLOR-CON) CR tablet 40 mEq (20 mEq Oral Given 08/11/19 1510)  metoCLOPramide (REGLAN) injection 10 mg (10 mg Intravenous Given 08/11/19 1702)  iohexol (OMNIPAQUE) 300 MG/ML solution 100 mL (80 mLs Intravenous Contrast Given 08/11/19 1846)  metroNIDAZOLE (FLAGYL) IVPB 500 mg (0 mg Intravenous Stopped 08/11/19 2344)  potassium chloride 20 MEQ/15ML (10%) solution 40 mEq (40 mEq Oral Given 08/12/19 2725)    Mobility walks with person assist High fall risk

## 2019-08-12 NOTE — ED Notes (Signed)
Assisted patient to the RR again, had another episode of function incontinence on the way to to RR, 2 times prior the patient had no incontinence. RN responded to patient right when she called out, still refuses pure-wick.

## 2019-08-12 NOTE — TOC Benefit Eligibility Note (Signed)
Transition of Care [facility]) Benefit Eligibility Note    Patient Details  Name: Azara Gemme MRN: [id number] Date of Birth: 1935/03/21                                Kerin Salen Phone Number: 08/12/2019, 4:26 PM

## 2019-08-12 NOTE — TOC Benefit Eligibility Note (Signed)
Transition of Care (TOC) Benefit Eligibility Note    Patient Details  Name: Anna Hoffman MRN: [id number] Date of Birth: 02/19/1935   Medication/Dose: Moxifloxacin  Covered?: Yes  Tier: 3 Drug(tier 3 for tablet tier $ for incection and tier 4 solution)  Prescription Coverage Preferred Pharmacy: local  Spoke with Person/Company/Phone Number:: Liz/ [employer] [phone]  Co-Pay: $47.00 tablet, incection $100.00 and solution $100.00  Prior Approval: No  Deductible: Met  Additional Notes: had Liz to quote highest do to no dosage listed    ,  Phone Number: 08/12/2019, 4:35 PM     

## 2019-08-12 NOTE — ED Notes (Addendum)
Report given to [facility], Therapist, sports.

## 2019-08-12 NOTE — ED Notes (Signed)
Patient denies pain and is resting comfortably.  

## 2019-08-15 ENCOUNTER — Encounter: Payer: Medicare HMO | Admitting: Gastroenterology

## 2019-08-15 ENCOUNTER — Telehealth: Payer: Self-pay

## 2019-08-15 ENCOUNTER — Telehealth: Payer: Self-pay | Admitting: Gastroenterology

## 2019-08-15 NOTE — Telephone Encounter (Signed)
Anna Hoffman this pt was seen in the ED for colitis/diverticulitis and prescribed flagyl.  She is complaining of nausea and vomiting with the metronidazole.   Also, she wants to know if she should keep the colon appt on 2/1 with Dr Ardis Hughs or does she need to be seen in the office first.  Please advise.

## 2019-08-15 NOTE — Telephone Encounter (Signed)
Transition Care Management Follow-up Telephone Call  Date of discharge and from where: 08/12/2019  How have you been since you were released from the hospital? Still sick   Any questions or concerns? no  Items Reviewed:  Did the pt receive and understand the discharge instructions provided? Yes   Medications obtained and verified? Yes   Any new allergies since your discharge? No   Dietary orders reviewed? no  Do you have support at home? Low sodium no peanuts  Other (ie: DME, Home Health, etc) no  Functional Questionnaire: (I = Independent and D = Dependent) ADL's: i  Bathing/Dressing- i   Meal Prep- i  Eating- i  Maintaining continence-i  Transferring/Ambulation- i Managing Meds- i   Follow up appointments reviewed:    PCP Hospital f/u appt confirmed?yes January 18  Specialist Hospital f/u appt confirmed?no  Are transportation arrangements needed? no  If their condition worsens, is the pt aware to call  their PCP or go to the ED? yes  Was the patient provided with contact information for the PCP's office or ED? Yes   Was the pt encouraged to call back with questions or concerns? Yes

## 2019-08-15 NOTE — Telephone Encounter (Signed)
Pt was just released from the ED and was prescribed metronidazole.  She stated that the medication is causing N/V.  Pt is scheduled for a colonoscopy 09/05/19 and would like to know whether this needs to be changed to an OV.

## 2019-08-16 NOTE — Telephone Encounter (Signed)
Pt called again, she states that she cannot take the medication anymore because it makes her vomit everytime and she feels sicker and sicker. She is requesting a call back today.

## 2019-08-16 NOTE — Telephone Encounter (Signed)
Anna Hoffman please review and advise

## 2019-08-16 NOTE — Telephone Encounter (Signed)
I do not think that the patient needs to take the Flagyl.  I think that she needs to have a colonoscopy and if it is possible to move it up earlier than we should do that.  Her CT scan that she had done recently showed similar findings as to the one that she had done on December 7.  I saw her on December 22 and she had no abdominal pain and felt great, so I am not sure that this is acute diverticulitis or colitis.  ? Of possible neoplasm.  Thank you,  Jess

## 2019-08-16 NOTE — Telephone Encounter (Signed)
The pt has been advised that she can stop the flagyl.  She states she has stopped the flagyl a couple days ago and feels much better just no appetite and fatigue.  She does state she has no abd pain currently.  She will keep appt for colon as planned.

## 2019-08-18 ENCOUNTER — Telehealth: Payer: Self-pay

## 2019-08-22 ENCOUNTER — Other Ambulatory Visit: Payer: Self-pay | Admitting: Gastroenterology

## 2019-08-22 ENCOUNTER — Ambulatory Visit (INDEPENDENT_AMBULATORY_CARE_PROVIDER_SITE_OTHER): Payer: Medicare Other | Admitting: Internal Medicine

## 2019-08-22 ENCOUNTER — Encounter: Payer: Self-pay | Admitting: Internal Medicine

## 2019-08-22 ENCOUNTER — Other Ambulatory Visit: Payer: Self-pay | Admitting: Internal Medicine

## 2019-08-22 ENCOUNTER — Ambulatory Visit (INDEPENDENT_AMBULATORY_CARE_PROVIDER_SITE_OTHER): Payer: Medicare Other

## 2019-08-22 ENCOUNTER — Other Ambulatory Visit: Payer: Self-pay

## 2019-08-22 VITALS — BP 132/76 | HR 95 | Temp 97.4°F | Ht 63.0 in | Wt 137.0 lb

## 2019-08-22 DIAGNOSIS — E876 Hypokalemia: Secondary | ICD-10-CM

## 2019-08-22 DIAGNOSIS — R9431 Abnormal electrocardiogram [ECG] [EKG]: Secondary | ICD-10-CM | POA: Diagnosis not present

## 2019-08-22 DIAGNOSIS — K529 Noninfective gastroenteritis and colitis, unspecified: Secondary | ICD-10-CM | POA: Diagnosis not present

## 2019-08-22 DIAGNOSIS — Z1159 Encounter for screening for other viral diseases: Secondary | ICD-10-CM

## 2019-08-22 MED FILL — AMLODIPINE BESYLATE 10 MG T: 10 | 90 days supply | Qty: 90 | Fill #0

## 2019-08-22 MED FILL — ROSUVASTATIN CALCIUM 20 MG: 20 | 90 days supply | Qty: 90 | Fill #0

## 2019-08-22 NOTE — Patient Instructions (Signed)
Colitis ° °Colitis is inflammation of the colon. Colitis may last a short time (be acute), or it may last a long time (become chronic). °What are the causes? °This condition may be caused by: °· Viruses. °· Bacteria. °· Reaction to medicine. °· Certain autoimmune diseases such as Crohn's disease or ulcerative colitis. °· Radiation treatment. °· Decreased blood flow to the bowel (ischemia). °What are the signs or symptoms? °Symptoms of this condition include: °· Watery diarrhea. °· Passing bloody or tarry stool. °· Pain. °· Fever. °· Vomiting. °· Tiredness (fatigue). °· Weight loss. °· Bloating. °· Abdominal pain. °· Having fewer bowel movements than usual. °· A strong and sudden urge to have a bowel movement. °· Feeling like the bowel is not empty after a bowel movement. °How is this diagnosed? °This condition is diagnosed with a stool test or a blood test. °You may also have other tests, such as: °· X-rays. °· CT scan. °· Colonoscopy. °· Endoscopy. °· Biopsy. °How is this treated? °Treatment for this condition depends on the cause. The condition may be treated by: °· Resting the bowel. This involves not eating or drinking for a period of time. °· Fluids that are given through an IV. °· Medicine for pain and diarrhea. °· Antibiotic medicines. °· Cortisone medicines. °· Surgery. °Follow these instructions at home: °Eating and drinking ° °· Follow instructions from your health care provider about eating or drinking restrictions. °· Drink enough fluid to keep your urine pale yellow. °· Work with a dietitian to determine which foods cause your condition to flare up. °· Avoid foods that cause flare-ups. °· Eat a well-balanced diet. °General instructions °· If you were prescribed an antibiotic medicine, take it as told by your health care provider. Do not stop taking the antibiotic even if you start to feel better. °· Take over-the-counter and prescription medicines only as told by your health care provider. °· Keep all  follow-up visits as told by your health care provider. This is important. °Contact a health care provider if: °· Your symptoms do not go away. °· You develop new symptoms. °Get help right away if you: °· Have a fever that does not go away with treatment. °· Develop chills. °· Have extreme weakness, fainting, or dehydration. °· Have repeated vomiting. °· Develop severe pain in your abdomen. °· Pass bloody or tarry stool. °Summary °· Colitis is inflammation of the colon. Colitis may last a short time (be acute), or it may last a long time (become chronic). °· Treatment for this condition depends on the cause and may include resting the bowel, taking medicines, or having surgery. °· If you were prescribed an antibiotic medicine, take it as told by your health care provider. Do not stop taking the antibiotic even if you start to feel better. °· Get help right away if you develop severe pain in your abdomen. °· Keep all follow-up visits as told by your health care provider. This is important. °This information is not intended to replace advice given to you by your health care provider. Make sure you discuss any questions you have with your health care provider. °Document Revised: 01/21/2018 Document Reviewed: 01/21/2018 °Elsevier Patient Education © 2020 [employer] ° °

## 2019-08-23 LAB — BMP8+EGFR
BUN/Creatinine Ratio: 13 (ref 12–28)
BUN: 14 mg/dL (ref 8–27)
CO2: 24 mmol/L (ref 20–29)
Calcium: 9.4 mg/dL (ref 8.7–10.3)
Chloride: 103 mmol/L (ref 96–106)
Creatinine, Ser: 1.04 mg/dL — ABNORMAL HIGH (ref 0.57–1.00)
GFR calc Af Amer: 58 mL/min/{1.73_m2} — ABNORMAL LOW (ref >59)
GFR calc non Af Amer: 50 mL/min/{1.73_m2} — ABNORMAL LOW (ref >59)
Glucose: 85 mg/dL (ref 65–99)
Potassium: 3.5 mmol/L (ref 3.5–5.2)
Sodium: 143 mmol/L (ref 134–144)

## 2019-08-23 LAB — CBC
Hematocrit: 40.2 % (ref 34.0–46.6)
Hemoglobin: 13.3 g/dL (ref 11.1–15.9)
MCH: 29.2 pg (ref 26.6–33.0)
MCHC: 33.1 g/dL (ref 31.5–35.7)
MCV: 88 fL (ref 79–97)
Platelets: 486 10*3/uL — ABNORMAL HIGH (ref 150–450)
RBC: 4.55 x10E6/uL (ref 3.77–5.28)
RDW: 13.3 % (ref 11.7–15.4)
WBC: 7.9 10*3/uL (ref 3.4–10.8)

## 2019-08-23 LAB — SARS CORONAVIRUS 2 (TAT 6-24 HRS): SARS Coronavirus 2: NEGATIVE

## 2019-08-25 ENCOUNTER — Ambulatory Visit: Payer: Self-pay

## 2019-08-25 DIAGNOSIS — K529 Noninfective gastroenteritis and colitis, unspecified: Secondary | ICD-10-CM

## 2019-08-25 DIAGNOSIS — N183 Chronic kidney disease, stage 3 unspecified: Secondary | ICD-10-CM

## 2019-08-25 NOTE — Chronic Care Management (AMB) (Signed)
Chronic Care Management    Social Work Follow Up Note  08/25/2019 Name: Sarea Fyfe MRN: [id number] DOB: 07-16-1935  Sahian Kerney is a 85 y.o. year old female who is a primary care patient of Glendale Chard, MD. The CCM team was consulted for assistance with care coordination.   Review of patient status, including review of consultants reports, other relevant assessments, and collaboration with appropriate care team members and the patient's provider was performed as part of comprehensive patient evaluation and provision of chronic care management services.    SW placed a successful outbound call to the patient to assist with care coordination needs.  Outpatient Encounter Medications as of 08/25/2019  Medication Sig  . acetaminophen (TYLENOL) 325 MG tablet Take 2 tablets (650 mg total) by mouth every 6 (six) hours as needed for mild pain (or Fever >/= 101).  Marland Kitchen albuterol (VENTOLIN HFA) 108 (90 Base) MCG/ACT inhaler Inhale 1-2 puffs into the lungs every 4 (four) hours as needed for wheezing or shortness of breath.  Marland Kitchen amLODipine (NORVASC) 10 MG tablet TAKE 1 TABLET BY MOUTH ONCE DAILY  . Ascorbic Acid (VITAMIN C) 1000 MG tablet Take 1,000 mg by mouth daily.  Marland Kitchen aspirin EC 81 MG tablet Take 81 mg by mouth daily.  Marland Kitchen azelastine (ASTELIN) 0.1 % nasal spray Place 2 sprays into both nostrils 2 (two) times daily. Use in each nostril as directed (Patient taking differently: Place 2 sprays into both nostrils daily as needed for allergies. Use in each nostril as directed)  . Carboxymethylcellul-Glycerin (LUBRICATING EYE DROPS OP) Apply 1 drop to eye daily as needed (dry eyes).   Marland Kitchen Fexofenadine HCl (ALLEGRA PO) Take 180 mg by mouth at bedtime.   Marland Kitchen glucose blood (ONETOUCH VERIO) test strip 1 each by Other route as needed for other. Use as instructed to check blood pressure 2 times per day dx: e11.65  . irbesartan (AVAPRO) 150 MG tablet TAKE 1 TABLET BY MOUTH EVERY DAY. (Patient taking differently: Take  150 mg by mouth daily. )  . Multiple Vitamins-Minerals (STRESS TAB NF PO) Take 1 tablet by mouth daily.  Marland Kitchen olopatadine (PATANOL) 0.1 % ophthalmic solution Place 1 drop into both eyes 2 (two) times daily.  . rosuvastatin (CRESTOR) 20 MG tablet TAKE 1 TABLET BY MOUTH ONCE DAILY  . trolamine salicylate (ASPERCREME) 10 % cream Apply 1 application topically as needed for muscle pain.  . vitamin B-12 (CYANOCOBALAMIN) 1000 MCG tablet Take 1,000 mcg by mouth daily.   No facility-administered encounter medications on file as of 08/25/2019.     Goals Addressed            This Visit's Progress   . Collaborate with RN Case Manager to perform appropriate assessments to determine care management and care coordination needs       Current Barriers:  Marland Kitchen Knowledge barriers related to the independent self-health management of chronic conditions including HTN and DM II.  Marland Kitchen Ongoing Colitis infection impacting diet and ability to stay hydrated  Social Work Clinical Goal(s):  Marland Kitchen Over the next 60 days the patient will reduce ED visit and IP admissions by attending upcoming Colonoscopy appointment and following treatment recommendations made by her Gastroenterologist . Over the next 90 days the patient will collaborate with the care management team regarding on-going management of DM II and HTN  CCM SW Interventions: . Outbound call placed to the patient to assess for on-going acute care needs post hospitalization stay 08/11/19 - 08/12/19 o Discussed patients recent  ED and IP admissions since mid-December (07/11/19-07/14/19, 07/16/19, and 08/11/19-08/12/19) o Confirmed patients knowledge of upcomming colonoscopy appointment on 09/05/19 - no transportation needs identified o Reviewed importance of maintaining communication with Dr. Ardis Hughs team at [facility] regarding any acute needs prior to scheduled colonoscopy . Discussed patients on-going management of DM II o Patient reports continued nausea and  fatigue o Patient continues to check glucose levels  o Chart reviewed to note last A1C of 6.7 drawn on 07/11/19. Within therapeutic range . Advised the patient to contact care management team with care coordination needs . Collaboration with RN Case Manager to communicate today's interventions and plan for ongoing care management and care coordination needs  Patient Self Care Activities:  . Patient verbalizes understanding of plan to follow up with care management team post colonoscopy appointment . Self administers medications as prescribed . Attends all scheduled provider appointments . Performs ADL's independently  Initial goal documentation         Follow Up Plan: SW will follow up with the patient over the next three weeks.   Daneen Schick, BSW, CDP Social Worker, Certified Dementia Practitioner [place] [facility] [place] [facility] [phone]  Total time spent performing care coordination and/or care management activities with the patient by phone or face to face = 10 minutes.

## 2019-08-25 NOTE — Patient Instructions (Signed)
Social Worker Visit Information  Goals we discussed today:  Goals Addressed            This Visit's Progress   . Collaborate with RN Case Manager to perform appropriate assessments to determine care management and care coordination needs       Current Barriers:  Marland Kitchen Knowledge barriers related to the independent self-health management of chronic conditions including HTN and DM II.  Marland Kitchen Ongoing Colitis infection impacting diet and ability to stay hydrated  Social Work Clinical Goal(s):  Marland Kitchen Over the next 60 days the patient will reduce ED visit and IP admissions by attending upcoming Colonoscopy appointment and following treatment recommendations made by her Gastroenterologist . Over the next 90 days the patient will collaborate with the care management team regarding on-going management of DM II and HTN  CCM SW Interventions: . Outbound call placed to the patient to assess for on-going acute care needs post hospitalization stay 08/11/19 - 08/12/19 o Discussed patients recent ED and IP admissions since mid-December (07/11/19-07/14/19, 07/16/19, and 08/11/19-08/12/19) o Confirmed patients knowledge of upcomming colonoscopy appointment on 09/05/19 - no transportation needs identified o Reviewed importance of maintaining communication with Dr. Ardis Hughs team at [facility] regarding any acute needs prior to scheduled colonoscopy . Discussed patients on-going management of DM II o Patient reports continued nausea and fatigue o Patient continues to check glucose levels  o Chart reviewed to note last A1C of 6.7 drawn on 07/11/19. Within therapeutic range . Advised the patient to contact care management team with care coordination needs . Collaboration with RN Case Manager to communicate today's interventions and plan for ongoing care management and care coordination needs  Patient Self Care Activities:  . Patient verbalizes understanding of plan to follow up with care management team post colonoscopy  appointment . Self administers medications as prescribed . Attends all scheduled provider appointments . Performs ADL's independently  Initial goal documentation         Follow Up Plan: SW will follow up with the patient over the next three weeks.   Daneen Schick, BSW, CDP Social Worker, Certified Dementia Practitioner [place] [facility] [place] [facility] [phone]

## 2019-08-27 NOTE — Progress Notes (Signed)
This visit occurred during the SARS-CoV-2 public health emergency.  Safety protocols were in place, including screening questions prior to the visit, additional usage of staff PPE, and extensive cleaning of exam room while observing appropriate contact time as indicated for disinfecting solutions.  Subjective:     Patient ID: Anna Hoffman , female    DOB: Dec 16, 1934 , 85 y.o.   MRN: [id number]   Chief Complaint  Patient presents with  . Diabetes  . Hypertension    HPI  She is here today for DM/BP check. She reports compliance with meds. She reports she is feeling better since her discharge from hospital. She was diagnosed with colitis- she was unable to take abx b/c they caused her to have N/V. She reports that she is scheduled for a colonoscopy next month.   Diabetes She presents for her follow-up diabetic visit. She has type 2 diabetes mellitus. Her disease course has been stable. There are no hypoglycemic associated symptoms. Pertinent negatives for hypoglycemia include no headaches. Pertinent negatives for diabetes include no blurred vision and no chest pain. There are no hypoglycemic complications. Risk factors for coronary artery disease include diabetes mellitus, dyslipidemia, hypertension, stress and post-menopausal. She is compliant with treatment most of the time. Her weight is stable. She is following a diabetic diet. Her breakfast blood glucose is taken between 7-8 am. Her breakfast blood glucose range is generally 90-110 mg/dl.  Hypertension This is a chronic problem. The current episode started more than 1 year ago. The problem has been gradually improving since onset. The problem is controlled. Pertinent negatives include no blurred vision, chest pain, headaches or shortness of breath.     Past Medical History:  Diagnosis Date  . Arthritis   . Diabetes mellitus without complication ([place])   . Hypertension   . Hypokalemia   . Pneumonia   . Shingles   . Stroke [facility])       Family History  Problem Relation Age of Onset  . Alzheimer's disease Mother   . Prostate cancer Father   . Brain cancer Brother   . Brain cancer Brother   . Pancreatic cancer Sister   . Allergic rhinitis Neg Hx   . Asthma Neg Hx   . Eczema Neg Hx   . Urticaria Neg Hx      Current Outpatient Medications:  .  acetaminophen (TYLENOL) 325 MG tablet, Take 2 tablets (650 mg total) by mouth every 6 (six) hours as needed for mild pain (or Fever >/= 101)., Disp: , Rfl:  .  albuterol (VENTOLIN HFA) 108 (90 Base) MCG/ACT inhaler, Inhale 1-2 puffs into the lungs every 4 (four) hours as needed for wheezing or shortness of breath., Disp: 18 g, Rfl: 1 .  Ascorbic Acid (VITAMIN C) 1000 MG tablet, Take 1,000 mg by mouth daily., Disp: , Rfl:  .  aspirin EC 81 MG tablet, Take 81 mg by mouth daily., Disp: , Rfl:  .  azelastine (ASTELIN) 0.1 % nasal spray, Place 2 sprays into both nostrils 2 (two) times daily. Use in each nostril as directed (Patient taking differently: Place 2 sprays into both nostrils daily as needed for allergies. Use in each nostril as directed), Disp: 30 mL, Rfl: 5 .  Carboxymethylcellul-Glycerin (LUBRICATING EYE DROPS OP), Apply 1 drop to eye daily as needed (dry eyes). , Disp: , Rfl:  .  Fexofenadine HCl (ALLEGRA PO), Take 180 mg by mouth at bedtime. , Disp: , Rfl:  .  glucose blood (ONETOUCH VERIO) test strip,  1 each by Other route as needed for other. Use as instructed to check blood pressure 2 times per day dx: e11.65, Disp: 100 each, Rfl: 11 .  irbesartan (AVAPRO) 150 MG tablet, TAKE 1 TABLET BY MOUTH EVERY DAY. (Patient taking differently: Take 150 mg by mouth daily. ), Disp: 90 tablet, Rfl: 1 .  Multiple Vitamins-Minerals (STRESS TAB NF PO), Take 1 tablet by mouth daily., Disp: , Rfl:  .  olopatadine (PATANOL) 0.1 % ophthalmic solution, Place 1 drop into both eyes 2 (two) times daily., Disp: 5 mL, Rfl: 5 .  trolamine salicylate (ASPERCREME) 10 % cream, Apply 1 application  topically as needed for muscle pain., Disp: , Rfl:  .  vitamin B-12 (CYANOCOBALAMIN) 1000 MCG tablet, Take 1,000 mcg by mouth daily., Disp: , Rfl:  .  amLODipine (NORVASC) 10 MG tablet, TAKE 1 TABLET BY MOUTH ONCE DAILY, Disp: 90 tablet, Rfl: 1 .  rosuvastatin (CRESTOR) 20 MG tablet, TAKE 1 TABLET BY MOUTH ONCE DAILY, Disp: 90 tablet, Rfl: 1   Allergies  Allergen Reactions  . Amoxicillin Diarrhea    Has patient had a PCN reaction causing immediate rash, facial/tongue/throat swelling, SOB or lightheadedness with hypotension: no Has patient had a PCN reaction causing severe rash involving mucus membranes or skin necrosis: no Has patient had a PCN reaction that required hospitalization: no pharmacist consult Has patient had a PCN reaction occurring within the last 10 years: yes If all of the above answers are "NO", then may proceed with Cephalosporin use.   . Ampicillin Rash    - Tolerates Rocephin and Ancef - Remote occurrence; no symptoms of anaphylaxis or severe cutaneous reaction, and no additional medical attention required    . Sulfa Antibiotics Rash     Review of Systems  Constitutional: Negative.   Eyes: Negative for blurred vision.  Respiratory: Negative.  Negative for shortness of breath.   Cardiovascular: Negative.  Negative for chest pain.  Gastrointestinal: Positive for diarrhea.       She reports she is feeling better since her last visit. She has been tolerating foods w/o GI distress. Was hospitalized last month for colitis.   Neurological: Negative.  Negative for headaches.  Psychiatric/Behavioral: Negative.      Today's Vitals   08/09/19 1410  BP: 124/80  Pulse: 94  Temp: 98.6 F (37 C)  TempSrc: Oral  Weight: 138 lb 12.8 oz (63 kg)  Height: '5\' 3"'  (1.6 m)   Body mass index is 24.59 kg/m.   Objective:  Physical Exam Vitals and nursing note reviewed.  Constitutional:      Appearance: Normal appearance.  HENT:     Head: Normocephalic and atraumatic.   Cardiovascular:     Rate and Rhythm: Normal rate and regular rhythm.     Heart sounds: Normal heart sounds.  Pulmonary:     Effort: Pulmonary effort is normal.     Breath sounds: Normal breath sounds.  Skin:    General: Skin is warm.  Neurological:     General: No focal deficit present.     Mental Status: She is alert.  Psychiatric:        Mood and Affect: Mood normal.        Behavior: Behavior normal.         Assessment And Plan:     1. Type 2 diabetes mellitus with stage 3 chronic kidney disease, without long-term current use of insulin, unspecified whether stage 3a or 3b CKD ([name])  Chronic. I will check renal  function today. Previous a1c results reviewed. She is encouraged to avoid sugary beverages.   - BMP8+EGFR  2. Hypertensive nephropathy  Chronic, well controlled. She will continue with current meds. She is encouraged to avoid adding salt to her foods.   3. Stage 3 chronic kidney disease, unspecified whether stage 3a or 3b CKD  Chronic, yet stable. Again, importance of adequate hydration was discussed with the patient.   4. Diastolic dysfunction  Chronic, yet stable.   5. Noninfectious colitis  Resolving. She is encouraged to avoid foods which seem to trigger sx of diarrhea. She is afebrile today.   Maximino Greenland, MD    THE PATIENT IS ENCOURAGED TO PRACTICE SOCIAL DISTANCING DUE TO THE COVID-19 PANDEMIC.

## 2019-08-29 ENCOUNTER — Ambulatory Visit (INDEPENDENT_AMBULATORY_CARE_PROVIDER_SITE_OTHER): Payer: Medicare Other

## 2019-08-29 ENCOUNTER — Telehealth: Payer: Medicare Other

## 2019-08-29 DIAGNOSIS — E1122 Type 2 diabetes mellitus with diabetic chronic kidney disease: Secondary | ICD-10-CM | POA: Diagnosis not present

## 2019-08-29 DIAGNOSIS — I129 Hypertensive chronic kidney disease with stage 1 through stage 4 chronic kidney disease, or unspecified chronic kidney disease: Secondary | ICD-10-CM

## 2019-08-29 DIAGNOSIS — N183 Chronic kidney disease, stage 3 unspecified: Secondary | ICD-10-CM | POA: Diagnosis not present

## 2019-08-29 DIAGNOSIS — I5189 Other ill-defined heart diseases: Secondary | ICD-10-CM

## 2019-08-31 NOTE — Patient Instructions (Signed)
Visit Information  Goals Addressed      Patient Stated   . COMPLETED: "I am not taking that medicine for my leg swelling beucase it has Sulfa in it" (pt-stated)       Current Barriers:  . Patient reported concerns of medication ingredients . Unable to identify specific "new medication" prescribed  Clinical Social Work Clinical Goal(s):  Marland Kitchen Over the next 45 days the patient will work with the CCM team to become more knowledgeable of medications prescribed to address chronic conditions. Goal completed  CCM RNCM Interventions: Completed 08/30/19 . Patient interviewed and appropriate assessments performed . Determined patient is now taking all prescribed medications exactly as directed . Determined patient's lower extremity edema has resolved and currently she is not experience this symptom . Discussed plans with patient for ongoing care management follow up and provided patient with direct contact information for care management team  Patient Self Care Activities:  . Performs ADL's independently . Calls provider office for new concerns or questions  Please see past updates related to this goal by clicking on the "Past Updates" button in the selected goal      . "to eat better and keep my blood sugars good" (pt-stated)       Current Barriers:  Marland Kitchen Knowledge Deficits related to diabetes disease process and Self Health management  . Chronic Disease Management support and education needs related to DM, HTN, Colitis  Nurse Case Manager Clinical Goal(s):  Marland Kitchen Over the next 90 days, patient will work with the [facility] and PCP to address needs related to diabetes disease education and support  Interventions:  . Evaluation of current treatment plan related to Diabetes Mellitus and patient's adherence to plan as established by provider. . Provided education to patient re: recent A1C of 6.7; discussed target A1C and how to best achieve/maintain this goal including medication and diet adherence and  implementing exercise with recommendations of 150 min per week as tolerated and as directed by PCP . Reviewed medications with patient and discussed patient is not prescribed to take a RX at this time and is controlling her DM through diet and exercise on her stationary bike . Discussed plans with patient for ongoing care management follow up and provided patient with direct contact information for care management team . Provided patient with printed educational materials related to Diabetes Management with Meal Planning  . Advised patient, providing education and rationale, to check cbg daily before meals and record, calling the CCM RN CM and or PCP for findings outside established parameters.    Patient Self Care Activities:  . Patient verbalizes understanding of plan to review printed patient DM education materials to be reviewed at next [facility] RNCM follow up call   . Self administers medications as prescribed . Attends all scheduled provider appointments . Calls pharmacy for medication refills . Performs ADL's independently . Performs IADL's independently . Calls provider office for new concerns or questions  Initial goal documentation     . COMPLETED: Assist with Chronic Care Management and Care Coordination needs (pt-stated)       Current Barriers:  Marland Kitchen Knowledge Barriers related to resources and support available to address needs related to Chronic Care Management and Care Coordination needs  Case Manager Clinical Goal(s):  Marland Kitchen Over the next 30 days, patient will work with the CCM team to address needs related to Chronic Disease Management and Care Coordination  Interventions:  . Collaborated with BSW and initiated plan of care to address needs  related to chronic disease management and medication management  Patient Self Care Activities:  . Performs ADL's independently . Calls provider office for new concerns or questions  Initial goal documentation       Other   . High Risk for  readmission       Current Barriers:  Marland Kitchen Knowledge Deficits related to disease process and Self health management of Colitis . Chronic Disease Management support and education needs related to Colitis, DM, HTN  Nurse Case Manager Clinical Goal(s):  Marland Kitchen Over the next 45 days, patient will work with the Gastroenterologist to address needs related to evaluation and treatment of Colitis . Over the next 90 days, patient will not experience hospital admission. Hospital Admissions in last 6 months = 3  Interventions:  . Evaluation of current treatment plan related to Infectious Colitis and patient's adherence to plan as established by provider. . Discussed plans with patient for ongoing care management follow up and provided patient with direct contact information for care management team . Determined Anna Hoffman is scheduled for a Colonoscopy on Monday, February 1st; discussed she was given the laxative and instructions for the prep to begin on Saturday prior to the procedure . Determined since Anna Hoffman's last IP admission, she has remained to be asymptomatic and feels she is progressing toward wellness . Discussed patient has a clear understanding to keep a close eye on her CBG while undergoing the Colon prep to help prevent hypoglycemia  Patient Self Care Activities:  . Self administers medications as prescribed . Attends all scheduled provider appointments . Calls pharmacy for medication refills . Performs ADL's independently . Performs IADL's independently . Calls provider office for new concerns or questions  Initial goal documentation        The patient verbalized understanding of instructions provided today and declined a print copy of patient instruction materials.   Telephone follow up appointment with care management team member scheduled for: 09/28/19  Barb Merino, RN, BSN, CCM Care Management Coordinator [place] [facility]  Direct Phone:  [phone]

## 2019-08-31 NOTE — Chronic Care Management (AMB) (Signed)
Chronic Care Management   Initial Visit Note  08/30/2019 Name: Anna Hoffman MRN: [id number] DOB: Nov 28, 1934  Referred by: Glendale Chard, MD Reason for referral : Chronic Care Management (1st attempt CCM RN CM Telephone Outreach )   Anna Hoffman is a 85 y.o. year old female who is a primary care patient of Glendale Chard, MD. The CCM team was consulted for assistance with chronic disease management and care coordination needs related to HTN, DMII and Infectious Colitis  Review of patient status, including review of consultants reports, relevant laboratory and other test results, and collaboration with appropriate care team members and the patient's provider was performed as part of comprehensive patient evaluation and provision of chronic care management services.    SDOH (Social Determinants of Health) screening performed today: None. See Care Plan for related entries.   Placed outbound call to patient for initial CCM RN CM telephone outreach and a care plan was established.   Medications: Outpatient Encounter Medications as of 08/29/2019  Medication Sig  . acetaminophen (TYLENOL) 325 MG tablet Take 2 tablets (650 mg total) by mouth every 6 (six) hours as needed for mild pain (or Fever >/= 101).  Marland Kitchen albuterol (VENTOLIN HFA) 108 (90 Base) MCG/ACT inhaler Inhale 1-2 puffs into the lungs every 4 (four) hours as needed for wheezing or shortness of breath.  Marland Kitchen amLODipine (NORVASC) 10 MG tablet TAKE 1 TABLET BY MOUTH ONCE DAILY  . Ascorbic Acid (VITAMIN C) 1000 MG tablet Take 1,000 mg by mouth daily.  Marland Kitchen aspirin EC 81 MG tablet Take 81 mg by mouth daily.  Marland Kitchen azelastine (ASTELIN) 0.1 % nasal spray Place 2 sprays into both nostrils 2 (two) times daily. Use in each nostril as directed (Patient taking differently: Place 2 sprays into both nostrils daily as needed for allergies. Use in each nostril as directed)  . Carboxymethylcellul-Glycerin (LUBRICATING EYE DROPS OP) Apply 1 drop to eye daily as  needed (dry eyes).   Marland Kitchen Fexofenadine HCl (ALLEGRA PO) Take 180 mg by mouth at bedtime.   Marland Kitchen glucose blood (ONETOUCH VERIO) test strip 1 each by Other route as needed for other. Use as instructed to check blood pressure 2 times per day dx: e11.65  . irbesartan (AVAPRO) 150 MG tablet TAKE 1 TABLET BY MOUTH EVERY DAY. (Patient taking differently: Take 150 mg by mouth daily. )  . Multiple Vitamins-Minerals (STRESS TAB NF PO) Take 1 tablet by mouth daily.  Marland Kitchen olopatadine (PATANOL) 0.1 % ophthalmic solution Place 1 drop into both eyes 2 (two) times daily.  . rosuvastatin (CRESTOR) 20 MG tablet TAKE 1 TABLET BY MOUTH ONCE DAILY  . trolamine salicylate (ASPERCREME) 10 % cream Apply 1 application topically as needed for muscle pain.  . vitamin B-12 (CYANOCOBALAMIN) 1000 MCG tablet Take 1,000 mcg by mouth daily.   No facility-administered encounter medications on file as of 08/29/2019.     Objective:  Lab Results  Component Value Date   HGBA1C 6.7 (H) 07/11/2019   HGBA1C 6.2 (H) 04/06/2019   HGBA1C 6.1 (H) 07/19/2018   Lab Results  Component Value Date   LDLCALC 124 (H) 06/29/2016   CREATININE 1.04 (H) 08/22/2019   BP Readings from Last 3 Encounters:  08/22/19 132/76  08/12/19 (!) 153/88  08/09/19 124/80    Goals Addressed      Patient Stated   . COMPLETED: "I am not taking that medicine for my leg swelling beucase it has Sulfa in it" (pt-stated)       Current  Barriers:  . Patient reported concerns of medication ingredients . Unable to identify specific "new medication" prescribed  Clinical Social Work Clinical Goal(s):  Marland Kitchen Over the next 45 days the patient will work with the CCM team to become more knowledgeable of medications prescribed to address chronic conditions. Goal completed  CCM RNCM Interventions: Completed 08/30/19 . Patient interviewed and appropriate assessments performed . Determined patient is now taking all prescribed medications exactly as directed . Determined  patient's lower extremity edema has resolved and currently she is not experience this symptom . Discussed plans with patient for ongoing care management follow up and provided patient with direct contact information for care management team  Patient Self Care Activities:  . Performs ADL's independently . Calls provider office for new concerns or questions  Please see past updates related to this goal by clicking on the "Past Updates" button in the selected goal      . "to eat better and keep my blood sugars good" (pt-stated)       Current Barriers:  Marland Kitchen Knowledge Deficits related to diabetes disease process and Self Health management  . Chronic Disease Management support and education needs related to DM, HTN, Colitis  Nurse Case Manager Clinical Goal(s):  Marland Kitchen Over the next 90 days, patient will work with the [place] and PCP to address needs related to diabetes disease education and support  Interventions:  . Evaluation of current treatment plan related to Diabetes Mellitus and patient's adherence to plan as established by provider. . Provided education to patient re: recent A1C of 6.7; discussed target A1C and how to best achieve/maintain this goal including medication and diet adherence and implementing exercise with recommendations of 150 min per week as tolerated and as directed by PCP . Reviewed medications with patient and discussed patient is not prescribed to take a RX at this time and is controlling her DM through diet and exercise on her stationary bike . Discussed plans with patient for ongoing care management follow up and provided patient with direct contact information for care management team . Provided patient with printed educational materials related to Diabetes Management with Meal Planning  . Advised patient, providing education and rationale, to check cbg daily before meals and record, calling the CCM RN CM and or PCP for findings outside established parameters.    Patient  Self Care Activities:  . Patient verbalizes understanding of plan to review printed patient DM education materials to be reviewed at next [facility] RNCM follow up call   . Self administers medications as prescribed . Attends all scheduled provider appointments . Calls pharmacy for medication refills . Performs ADL's independently . Performs IADL's independently . Calls provider office for new concerns or questions  Initial goal documentation     . COMPLETED: Assist with Chronic Care Management and Care Coordination needs (pt-stated)       Current Barriers:  Marland Kitchen Knowledge Barriers related to resources and support available to address needs related to Chronic Care Management and Care Coordination needs  Case Manager Clinical Goal(s):  Marland Kitchen Over the next 30 days, patient will work with the CCM team to address needs related to Chronic Disease Management and Care Coordination  Interventions:  . Collaborated with BSW and initiated plan of care to address needs related to chronic disease management and medication management  Patient Self Care Activities:  . Performs ADL's independently . Calls provider office for new concerns or questions  Initial goal documentation       Other   .  High Risk for readmission       Current Barriers:  Marland Kitchen Knowledge Deficits related to disease process and Self health management of Colitis . Chronic Disease Management support and education needs related to Colitis, DM, HTN  Nurse Case Manager Clinical Goal(s):  Marland Kitchen Over the next 45 days, patient will work with the Gastroenterologist to address needs related to evaluation and treatment of Colitis . Over the next 90 days, patient will not experience hospital admission. Hospital Admissions in last 6 months = 3  Interventions:  . Evaluation of current treatment plan related to Infectious Colitis and patient's adherence to plan as established by provider. . Discussed plans with patient for ongoing care management follow up  and provided patient with direct contact information for care management team . Determined Ms. Benthall is scheduled for a Colonoscopy on Monday, February 1st; discussed she was given the laxative and instructions for the prep to begin on Saturday prior to the procedure . Determined since Ms. Hufstetler's last IP admission, she has remained to be asymptomatic and feels she is progressing toward wellness . Discussed patient has a clear understanding to keep a close eye on her CBG while undergoing the Colon prep to help prevent hypoglycemia  Patient Self Care Activities:  . Self administers medications as prescribed . Attends all scheduled provider appointments . Calls pharmacy for medication refills . Performs ADL's independently . Performs IADL's independently . Calls provider office for new concerns or questions  Initial goal documentation       Plan:   Telephone follow up appointment with care management team member scheduled for: 09/28/19  Barb Merino, RN, BSN, CCM Care Management Coordinator [facility]  Direct Phone: [phone]

## 2019-09-01 ENCOUNTER — Other Ambulatory Visit: Payer: Self-pay

## 2019-09-01 DIAGNOSIS — Z01818 Encounter for other preprocedural examination: Secondary | ICD-10-CM

## 2019-09-01 MED FILL — SHINGRIX 50 MCG SUS: 50 | 1 days supply | Qty: 1 | Fill #0

## 2019-09-02 ENCOUNTER — Other Ambulatory Visit: Payer: Self-pay

## 2019-09-02 ENCOUNTER — Ambulatory Visit (INDEPENDENT_AMBULATORY_CARE_PROVIDER_SITE_OTHER): Payer: Self-pay

## 2019-09-02 DIAGNOSIS — Z1159 Encounter for screening for other viral diseases: Secondary | ICD-10-CM

## 2019-09-05 ENCOUNTER — Other Ambulatory Visit: Payer: Self-pay

## 2019-09-05 ENCOUNTER — Ambulatory Visit (AMBULATORY_SURGERY_CENTER): Payer: Medicare Other | Admitting: Gastroenterology

## 2019-09-05 ENCOUNTER — Encounter: Payer: Self-pay | Admitting: Gastroenterology

## 2019-09-05 VITALS — BP 125/82 | HR 102 | Temp 97.1°F | Resp 16 | Ht 63.0 in | Wt 144.0 lb

## 2019-09-05 DIAGNOSIS — D123 Benign neoplasm of transverse colon: Secondary | ICD-10-CM

## 2019-09-05 DIAGNOSIS — D125 Benign neoplasm of sigmoid colon: Secondary | ICD-10-CM | POA: Diagnosis not present

## 2019-09-05 DIAGNOSIS — R9389 Abnormal findings on diagnostic imaging of other specified body structures: Secondary | ICD-10-CM | POA: Diagnosis not present

## 2019-09-05 DIAGNOSIS — D124 Benign neoplasm of descending colon: Secondary | ICD-10-CM

## 2019-09-05 DIAGNOSIS — D122 Benign neoplasm of ascending colon: Secondary | ICD-10-CM

## 2019-09-05 DIAGNOSIS — R197 Diarrhea, unspecified: Secondary | ICD-10-CM

## 2019-09-05 DIAGNOSIS — K5730 Diverticulosis of large intestine without perforation or abscess without bleeding: Secondary | ICD-10-CM

## 2019-09-05 LAB — SARS CORONAVIRUS 2 (TAT 6-24 HRS): SARS Coronavirus 2: NEGATIVE

## 2019-09-05 MED ORDER — SODIUM CHLORIDE 0.9 % IV SOLN: 500.0000 mL | Freq: Once | INTRAVENOUS | Status: DC

## 2019-09-05 NOTE — Patient Instructions (Signed)
HANDOUTS PROVIDED ON: POLYPS   The polyps removed today have been sent for pathology.  The results can take 1-3 weeks to receive.  When your next colonoscopy should occur will be based on the pathology results.    You may resume your previous diet and medication schedule.  Thank you for allowing [place] to care for you today!!!  YOU HAD AN ENDOSCOPIC PROCEDURE TODAY AT [facility]:   Refer to the procedure report that was given to you for any specific questions about what was found during the examination.  If the procedure report does not answer your questions, please call your gastroenterologist to clarify.  If you requested that your care partner not be given the details of your procedure findings, then the procedure report has been included in a sealed envelope for you to review at your convenience later.  YOU SHOULD EXPECT: Some feelings of bloating in the abdomen. Passage of more gas than usual.  Walking can help get rid of the air that was put into your GI tract during the procedure and reduce the bloating. If you had a lower endoscopy (such as a colonoscopy or flexible sigmoidoscopy) you may notice spotting of blood in your stool or on the toilet paper. If you underwent a bowel prep for your procedure, you may not have a normal bowel movement for a few days.  Please Note:  You might notice some irritation and congestion in your nose or some drainage.  This is from the oxygen used during your procedure.  There is no need for concern and it should clear up in a day or so.  SYMPTOMS TO REPORT IMMEDIATELY:   Following lower endoscopy (colonoscopy or flexible sigmoidoscopy):  Excessive amounts of blood in the stool  Significant tenderness or worsening of abdominal pains  Swelling of the abdomen that is new, acute  Fever of 100F or higher  For urgent or emergent issues, a gastroenterologist can be reached at any hour by calling [phone].   DIET:  We do recommend a small  meal at first, but then you may proceed to your regular diet.  Drink plenty of fluids but you should avoid alcoholic beverages for 24 hours.  ACTIVITY:  You should plan to take it easy for the rest of today and you should NOT DRIVE or use heavy machinery until tomorrow (because of the sedation medicines used during the test).    FOLLOW UP: Our staff will call the number listed on your records 48-72 hours following your procedure to check on you and address any questions or concerns that you may have regarding the information given to you following your procedure. If we do not reach you, we will leave a message.  We will attempt to reach you two times.  During this call, we will ask if you have developed any symptoms of COVID 19. If you develop any symptoms (ie: fever, flu-like symptoms, shortness of breath, cough etc.) before then, please call [phone].  If you test positive for Covid 19 in the 2 weeks post procedure, please call and report this information to [place].    If any biopsies were taken you will be contacted by phone or by letter within the next 1-3 weeks.  Please call us at [phone] if you have not heard about the biopsies in 3 weeks.    SIGNATURES/CONFIDENTIALITY: You and/or your care partner have signed paperwork which will be entered into your electronic medical record.  These signatures attest to  the fact that that the information above on your After Visit Summary has been reviewed and is understood.  Full responsibility of the confidentiality of this discharge information lies with you and/or your care-partner.

## 2019-09-05 NOTE — Progress Notes (Signed)
Report given to PACU, vss 

## 2019-09-05 NOTE — Progress Notes (Signed)
Pt's states no medical or surgical changes since previsit or office visit.  Temp- June Vitals- Donna 

## 2019-09-05 NOTE — Progress Notes (Signed)
Called to room to assist during endoscopic procedure.  Patient ID and intended procedure confirmed with present staff. Received instructions for my participation in the procedure from the performing physician.  

## 2019-09-05 NOTE — Progress Notes (Signed)
1102 HR down to 29-33 range with Dr Edison Nasuti updated and pulled scope back, Robinal 0.2 IV given, vss

## 2019-09-05 NOTE — Op Note (Signed)
[facility] Patient Name: Anna Hoffman Procedure Date: 09/05/2019 10:42 AM MRN: [id number] Endoscopist: Milus Banister , MD Age: 85 Referring MD:  Date of Birth: 01-Apr-1935 Gender: Female Account #: [id number] Procedure:                Colonoscopy Indications:              Abnormal CT of the GI tract; abnormal proximal                            sigmoid on recent CT scans, loose stools that have                            improved Medicines:                Monitored Anesthesia Care Procedure:                Pre-Anesthesia Assessment:                           - Prior to the procedure, a History and Physical                            was performed, and patient medications and                            allergies were reviewed. The patient's tolerance of                            previous anesthesia was also reviewed. The risks                            and benefits of the procedure and the sedation                            options and risks were discussed with the patient.                            All questions were answered, and informed consent                            was obtained. Prior Anticoagulants: The patient has                            taken no previous anticoagulant or antiplatelet                            agents. ASA Grade Assessment: III - A patient with                            severe systemic disease. After reviewing the risks                            and benefits, the patient was deemed in  satisfactory condition to undergo the procedure.                           After obtaining informed consent, the colonoscope                            was passed under direct vision. Throughout the                            procedure, the patient's blood pressure, pulse, and                            oxygen saturations were monitored continuously. The                            Colonoscope was introduced through the anus and                        advanced to the the cecum, identified by                            appendiceal orifice and ileocecal valve. The                            colonoscopy was performed without difficulty. The                            patient tolerated the procedure well. The quality                            of the bowel preparation was adequate. The                            ileocecal valve, appendiceal orifice, and rectum                            were photographed. Scope In: 10:47:09 AM Scope Out: 11:43:48 AM Scope Withdrawal Time: 0 hours 38 minutes 36 seconds  Total Procedure Duration: 0 hours 56 minutes 39 seconds  Findings:                 Seven sessile polyps were found in the transverse                            colon and ascending colon. The polyps were 3 to 10                            mm in size. These polyps were removed with a cold                            snare. Resection and retrieval were complete. Jar 1.                           Twelve pedunculated and sessile polyps were found  in the descending colon. The polyps were 4 to 18 mm                            in size. The three largest polyps (15, 16 and 51mm)                            all were removed with snare cautery and the                            remaining were removed with cold snare. Jar 2. One                            of the smaller polyps (see images) in the                            descending bled quite a bit more than is usual and                            required placement of two endoclips to stop the                            bleeding.                           Three sessile polyps were found in the sigmoid                            colon. The polyps were 9 to 10 mm in size. These                            polyps were removed with a cold snare. Resection                            and retrieval were complete. Jar 3.                           Severe left sided  diverticulosis changes                            (thickenend, edematous mucosa and tortuous lumen).                            This is almost certainly the cause of the abnormal                            proximal sigmoid colon noted on CT. It was quite                            difficult to advance the colonoscope proximal to                            the severe diverticular changes.  The exam was otherwise without abnormality on                            direct and retroflexion views. Complications:            No immediate complications. Estimated blood loss:                            None. Estimated Blood Loss:     Estimated blood loss: none. Impression:               - Seven sessile polyps were found in the transverse                            colon and ascending colon. The polyps were 3 to 10                            mm in size. These polyps were removed with a cold                            snare. Resection and retrieval were complete. Jar 1.                           - Twelve pedunculated and sessile polyps were found                            in the descending colon. The polyps were 4 to 18 mm                            in size. The three largest pedunculated polyps (15,                            16 and 52mm) all were removed with snare cautery                            and the remaining were removed with cold snare. Jar                            2. One of the smaller polyps (see images,                            semipedunculated) in the descending bled quite a                            bit more than is usual and required placement of                            two endoclips to stop the bleeding.                           - Three sessile polyps were found in the sigmoid  colon. The polyps were 9 to 10 mm in size. These                            polyps were removed with a cold snare. Resection                             and retrieval were complete. Jar 3.                           - Severe left sided diverticulosis changes                            (thickenend, edematous mucosa and tortuous lumen).                            This is almost certainly the cause of the abnormal                            proximal sigmoid colon noted on CT.                           - The exam was otherwise without abnormality on                            direct and retroflexion views. Recommendation:           - Patient has a contact number available for                            emergencies. The signs and symptoms of potential                            delayed complications were discussed with the                            patient. Return to normal activities tomorrow.                            Written discharge instructions were provided to the                            patient.                           - Resume previous diet.                           - Continue present medications.                           - Await pathology results. Milus Banister, MD 09/05/2019 11:55:53 AM This report has been signed electronically.

## 2019-09-06 ENCOUNTER — Telehealth: Payer: Self-pay | Admitting: Gastroenterology

## 2019-09-06 NOTE — Telephone Encounter (Signed)
I agree, she should continue to advance her diet.  The diarrhea should subside as her remaining prep eventually washes through.  She should absolutely call here if she starts having significant bleeding or abdominal pains.

## 2019-09-06 NOTE — Telephone Encounter (Signed)
Relayed Dr. Ardis Hughs information to her.

## 2019-09-06 NOTE — Telephone Encounter (Signed)
Pt had a colonoscopy yesterday and stated that she is experiencing diarrhea.  Please advise.

## 2019-09-07 ENCOUNTER — Telehealth: Payer: Self-pay

## 2019-09-07 NOTE — Telephone Encounter (Signed)
  Follow up Call-  Call back number 09/05/2019  Post procedure Call Back phone  # [phone]  Permission to leave phone message Yes  Some recent data might be hidden     Patient questions:  Do you have a fever, pain , or abdominal swelling? No. Pain Score  0 *  Have you tolerated food without any problems? No.  Have you been able to return to your normal activities? No.  Do you have any questions about your discharge instructions: Diet   Yes.   Medications  No. Follow up visit  Yes.    Do you have questions or concerns about your Care? Yes.    Actions: * If pain score is 4 or above: No action needed, pain <4.  Patient states she called yesterday because she was having diarrhea and states she had vomited last night. She has not vomited this am nor has she attempted to eat anything. She states the diarrhea has improved somewhat. She denies abdominal pain or bleeding. I looked at the advice Dr. Ardis Hughs had relayed to her when she called yesterday and advised her to attempt to eat something bland this am as well as fluids. Asked her to please call us back after lunch if she is still unable to keep food/fluids down and/or develops new symptoms. Patient verbalizes understanding. Message routed to Dr. Ardis Hughs.  1. Have you developed a fever since your procedure? no  2.   Have you had an respiratory symptoms (SOB or cough) since your procedure? no  3.   Have you tested positive for COVID 19 since your procedure no  4.   Have you had any family members/close contacts diagnosed with the COVID 19 since your procedure?  no   If yes to any of these questions please route to Joylene John, RN and Alphonsa Gin, Therapist, sports.

## 2019-09-07 NOTE — Telephone Encounter (Signed)
Great, thanks

## 2019-09-07 NOTE — Telephone Encounter (Signed)
Called patient to see how she is doing since I spoke with her this am. She states she has not had any more vomiting or diarrhea and was able to eat a hard boiled egg and toast without problems. She states that she still feels a little weak, though. Advised patient to continue to try to push fluids (water and gatorade) and to call us back if she does not continue to improve or gets worse and that I will let Dr. Ardis Hughs know how she is doing. Patient verbalizes understanding.

## 2019-09-08 ENCOUNTER — Telehealth: Payer: Self-pay | Admitting: Gastroenterology

## 2019-09-08 NOTE — Telephone Encounter (Signed)
The pt states that she has continued to have diarrhea since colonoscopy.  She was advised to take fiber and she can take an imodium.  She was advised to stop if she becomes constipated. She will call back if the loose stools do not resolve in a few days.

## 2019-09-08 NOTE — Telephone Encounter (Signed)
Pt states that diarrhea is still bad. She eat a piece of chicken last night and stated that she "spent the entire night in the bathroom." She would like some advice.

## 2019-09-09 ENCOUNTER — Encounter: Payer: Self-pay | Admitting: Gastroenterology

## 2019-09-09 ENCOUNTER — Ambulatory Visit: Payer: Medicare Other

## 2019-09-09 DIAGNOSIS — K529 Noninfective gastroenteritis and colitis, unspecified: Secondary | ICD-10-CM

## 2019-09-09 DIAGNOSIS — N183 Chronic kidney disease, stage 3 unspecified: Secondary | ICD-10-CM

## 2019-09-09 NOTE — Chronic Care Management (AMB) (Signed)
Chronic Care Management    Social Work Follow Up Note  09/09/2019 Name: Ruthell Feigenbaum MRN: [id number] DOB: 1935-04-11  Gift Rueckert is a 85 y.o. year old female who is a primary care patient of Glendale Chard, MD. The CCM team was consulted for assistance with care coordination.   Review of patient status, including review of consultants reports, other relevant assessments, and collaboration with appropriate care team members and the patient's provider was performed as part of comprehensive patient evaluation and provision of chronic care management services.    SW placed an outbound call to the patient to assist with care coordination needs.  Outpatient Encounter Medications as of 09/09/2019  Medication Sig  . acetaminophen (TYLENOL) 325 MG tablet Take 2 tablets (650 mg total) by mouth every 6 (six) hours as needed for mild pain (or Fever >/= 101).  Marland Kitchen albuterol (VENTOLIN HFA) 108 (90 Base) MCG/ACT inhaler Inhale 1-2 puffs into the lungs every 4 (four) hours as needed for wheezing or shortness of breath. (Patient not taking: Reported on 09/05/2019)  . amLODipine (NORVASC) 10 MG tablet TAKE 1 TABLET BY MOUTH ONCE DAILY  . Ascorbic Acid (VITAMIN C) 1000 MG tablet Take 1,000 mg by mouth daily.  Marland Kitchen aspirin EC 81 MG tablet Take 81 mg by mouth daily.  Marland Kitchen azelastine (ASTELIN) 0.1 % nasal spray Place 2 sprays into both nostrils 2 (two) times daily. Use in each nostril as directed (Patient not taking: Reported on 09/05/2019)  . Carboxymethylcellul-Glycerin (LUBRICATING EYE DROPS OP) Apply 1 drop to eye daily as needed (dry eyes).   Marland Kitchen Fexofenadine HCl (ALLEGRA PO) Take 180 mg by mouth at bedtime.   Marland Kitchen glucose blood (ONETOUCH VERIO) test strip 1 each by Other route as needed for other. Use as instructed to check blood pressure 2 times per day dx: e11.65  . irbesartan (AVAPRO) 150 MG tablet TAKE 1 TABLET BY MOUTH EVERY DAY. (Patient not taking: No sig reported)  . Multiple Vitamins-Minerals (STRESS TAB NF PO)  Take 1 tablet by mouth daily.  Marland Kitchen olopatadine (PATANOL) 0.1 % ophthalmic solution Place 1 drop into both eyes 2 (two) times daily.  . rosuvastatin (CRESTOR) 20 MG tablet TAKE 1 TABLET BY MOUTH ONCE DAILY  . trolamine salicylate (ASPERCREME) 10 % cream Apply 1 application topically as needed for muscle pain.  . vitamin B-12 (CYANOCOBALAMIN) 1000 MCG tablet Take 1,000 mcg by mouth daily.   No facility-administered encounter medications on file as of 09/09/2019.     Goals Addressed            This Visit's Progress   . Collaborate with RN Case Manager to perform appropriate assessments to determine care management and care coordination needs       Current Barriers:  Marland Kitchen Knowledge barriers related to the independent self-health management of chronic conditions including HTN and DM II.  Marland Kitchen Ongoing Colitis infection impacting diet and ability to stay hydrated  Social Work Clinical Goal(s):  Marland Kitchen Over the next 60 days the patient will reduce ED visit and IP admissions by attending upcoming Colonoscopy appointment and following treatment recommendations made by her Gastroenterologist . Over the next 90 days the patient will collaborate with the care management team regarding on-going management of DM II and HTN  CCM SW Interventions: Completed 09/09/19 . Outbound call placed to the patient to assess progression of patient goal . Confirmed patient attended colonoscopy performed by Dr. Ardis Hughs with [facility] on 09/05/19 . Performed chart review to note previous ongoing  concern with diarrhea o Assessed for ongoing symptoms- patient reports feeling better today and reports ability to eat baked chicken and toast with no adverse effects o Encouraged the patient to contact Dr. Eugenia Pancoast office with ongoing symptom management . Advised the patient to contact care management team with care coordination needs . Collaboration with RN Case Manager to communicate today's interventions and plan for ongoing  care management and care coordination needs  Patient Self Care Activities:  . Patient verbalizes understanding of plan to follow up with care management team post colonoscopy appointment . Self administers medications as prescribed . Attends all scheduled provider appointments . Performs ADL's independently  Please see past updates related to this goal by clicking on the "Past Updates" button in the selected goal          Follow Up Plan: A member of the care management team will follow up with the patient over the next four weeks.   Daneen Schick, BSW, CDP Social Worker, Certified Dementia Practitioner [place] [facility] [place] [facility] [phone]  Total time spent performing care coordination and/or care management activities with the patient by phone or face to face = 7 minutes.

## 2019-09-09 NOTE — Patient Instructions (Signed)
Social Worker Visit Information  Goals we discussed today:  Goals Addressed            This Visit's Progress   . Collaborate with RN Case Manager to perform appropriate assessments to determine care management and care coordination needs       Current Barriers:  Marland Kitchen Knowledge barriers related to the independent self-health management of chronic conditions including HTN and DM II.  Marland Kitchen Ongoing Colitis infection impacting diet and ability to stay hydrated  Social Work Clinical Goal(s):  Marland Kitchen Over the next 60 days the patient will reduce ED visit and IP admissions by attending upcoming Colonoscopy appointment and following treatment recommendations made by her Gastroenterologist . Over the next 90 days the patient will collaborate with the care management team regarding on-going management of DM II and HTN  CCM SW Interventions: Completed 09/09/19 . Outbound call placed to the patient to assess progression of patient goal . Confirmed patient attended colonoscopy performed by Dr. Ardis Hughs with [facility] on 09/05/19 . Performed chart review to note previous ongoing concern with diarrhea o Assessed for ongoing symptoms- patient reports feeling better today and reports ability to eat baked chicken and toast with no adverse effects o Encouraged the patient to contact Dr. Eugenia Pancoast office with ongoing symptom management . Advised the patient to contact care management team with care coordination needs . Collaboration with RN Case Manager to communicate today's interventions and plan for ongoing care management and care coordination needs  Patient Self Care Activities:  . Patient verbalizes understanding of plan to follow up with care management team post colonoscopy appointment . Self administers medications as prescribed . Attends all scheduled provider appointments . Performs ADL's independently  Please see past updates related to this goal by clicking on the "Past Updates" button in the  selected goal          Follow Up Plan: A member of the care management team will follow up with the patient over the next four weeks.   Daneen Schick, BSW, CDP Social Worker, Certified Dementia Practitioner [place] [facility] [place] [facility] [phone]

## 2019-09-18 ENCOUNTER — Encounter: Payer: Self-pay | Admitting: Internal Medicine

## 2019-09-18 NOTE — Progress Notes (Signed)
This visit occurred during the SARS-CoV-2 public health emergency.  Safety protocols were in place, including screening questions prior to the visit, additional usage of staff PPE, and extensive cleaning of exam room while observing appropriate contact time as indicated for disinfecting solutions.  Subjective:     Patient ID: Anna Hoffman , female    DOB: 10/19/1934 , 85 y.o.   MRN: [id number]   Chief Complaint  Patient presents with  . Hospitalization Follow-up    HPI  She presents today for hospital f//u. She presented to [facility] hospital on 1/7 for further evaluation of right lower quadrant abdominal pain associated with vomiting and diarrhea again. (of note she presented to [facility] on 12/7 with similar sx and treated for colitis)  She has had similar GI symptoms in the past, which have been attributed to IBS. She has an outpatient colonoscopy scheduled next month. Patient denied any fevers. Vomiting and diarrhea have been nonbloody. Patient be referred for admission for a potassium level 2.8. Abdominal exam is benign. CT shows colitis. She denies any Covid symptoms respiratory wise.  Tolerated IV CTX and flagyl without any issues or N/V. She was able to tolerate oral intake without issues. She denies having any abdominal pain or fever. She was given IV and PO K replacement. She was discharged in stable condition on 1/8. Unfortunately, she was discharged on ORAL antibiotics, something she has not been able to tolerate in the past. She admits that she has not been compliant due to n/v associated with the medication. She has not had any recurrence of sx since her d/c. She reports being frustrated b/c she does not seem to be getting any better. She feels her bout with shingles last year is causing all of her sx.     Past Medical History:  Diagnosis Date  . Arthritis   . Diabetes mellitus without complication ([place])   . Hypertension   . Hypokalemia   . Pneumonia   . Shingles   . Stroke  [facility])      Family History  Problem Relation Age of Onset  . Alzheimer's disease Mother   . Prostate cancer Father   . Brain cancer Brother   . Brain cancer Brother   . Pancreatic cancer Sister   . Allergic rhinitis Neg Hx   . Asthma Neg Hx   . Eczema Neg Hx   . Urticaria Neg Hx      Current Outpatient Medications:  .  acetaminophen (TYLENOL) 325 MG tablet, Take 2 tablets (650 mg total) by mouth every 6 (six) hours as needed for mild pain (or Fever >/= 101)., Disp: , Rfl:  .  albuterol (VENTOLIN HFA) 108 (90 Base) MCG/ACT inhaler, Inhale 1-2 puffs into the lungs every 4 (four) hours as needed for wheezing or shortness of breath. (Patient not taking: Reported on 09/05/2019), Disp: 18 g, Rfl: 1 .  amLODipine (NORVASC) 10 MG tablet, TAKE 1 TABLET BY MOUTH ONCE DAILY, Disp: 90 tablet, Rfl: 1 .  Ascorbic Acid (VITAMIN C) 1000 MG tablet, Take 1,000 mg by mouth daily., Disp: , Rfl:  .  aspirin EC 81 MG tablet, Take 81 mg by mouth daily., Disp: , Rfl:  .  azelastine (ASTELIN) 0.1 % nasal spray, Place 2 sprays into both nostrils 2 (two) times daily. Use in each nostril as directed (Patient not taking: Reported on 09/05/2019), Disp: 30 mL, Rfl: 5 .  Carboxymethylcellul-Glycerin (LUBRICATING EYE DROPS OP), Apply 1 drop to eye daily as needed (dry eyes). ,  Disp: , Rfl:  .  Fexofenadine HCl (ALLEGRA PO), Take 180 mg by mouth at bedtime. , Disp: , Rfl:  .  glucose blood (ONETOUCH VERIO) test strip, 1 each by Other route as needed for other. Use as instructed to check blood pressure 2 times per day dx: e11.65, Disp: 100 each, Rfl: 11 .  irbesartan (AVAPRO) 150 MG tablet, TAKE 1 TABLET BY MOUTH EVERY DAY. (Patient not taking: No sig reported), Disp: 90 tablet, Rfl: 1 .  Multiple Vitamins-Minerals (STRESS TAB NF PO), Take 1 tablet by mouth daily., Disp: , Rfl:  .  olopatadine (PATANOL) 0.1 % ophthalmic solution, Place 1 drop into both eyes 2 (two) times daily., Disp: 5 mL, Rfl: 5 .  rosuvastatin (CRESTOR) 20  MG tablet, TAKE 1 TABLET BY MOUTH ONCE DAILY, Disp: 90 tablet, Rfl: 1 .  trolamine salicylate (ASPERCREME) 10 % cream, Apply 1 application topically as needed for muscle pain., Disp: , Rfl:  .  vitamin B-12 (CYANOCOBALAMIN) 1000 MCG tablet, Take 1,000 mcg by mouth daily., Disp: , Rfl:    Allergies  Allergen Reactions  . Amoxicillin Diarrhea    Has patient had a PCN reaction causing immediate rash, facial/tongue/throat swelling, SOB or lightheadedness with hypotension: no Has patient had a PCN reaction causing severe rash involving mucus membranes or skin necrosis: no Has patient had a PCN reaction that required hospitalization: no pharmacist consult Has patient had a PCN reaction occurring within the last 10 years: yes If all of the above answers are "NO", then may proceed with Cephalosporin use.   . Ampicillin Rash    - Tolerates Rocephin and Ancef - Remote occurrence; no symptoms of anaphylaxis or severe cutaneous reaction, and no additional medical attention required    . Sulfa Antibiotics Rash     Review of Systems  Constitutional: Positive for fatigue.  Respiratory: Negative.   Cardiovascular: Negative.   Gastrointestinal: Negative.   Neurological: Negative.   Psychiatric/Behavioral: Negative.      Today's Vitals   08/22/19 1138  BP: 132/76  Pulse: 95  Temp: (!) 97.4 F (36.3 C)  TempSrc: Oral  Weight: 137 lb (62.1 kg)  Height: '5\' 3"'  (1.6 m)   Body mass index is 24.27 kg/m.   Objective:  Physical Exam Vitals and nursing note reviewed.  Constitutional:      Appearance: Normal appearance.  HENT:     Head: Normocephalic and atraumatic.  Cardiovascular:     Rate and Rhythm: Normal rate and regular rhythm.     Heart sounds: Normal heart sounds.  Pulmonary:     Effort: Pulmonary effort is normal.     Breath sounds: Normal breath sounds.  Skin:    General: Skin is warm.  Neurological:     General: No focal deficit present.     Mental Status: She is alert.   Psychiatric:        Mood and Affect: Mood normal.        Behavior: Behavior normal.         Assessment And Plan:     1. Colitis  TCM PERFORMED. A MEMBER OF THE CLINICAL TEAM SPOKE WITH THE PATIENT UPON DISCHARGE. DISCHARGE SUMMARY WAS REVIEWED IN FULL DETAIL DURING THE VISIT. MEDS RECONCILED AND COMPARED TO DISCHARGE MEDS. MEDICATION LIST WAS UPDATED AND REVIEWED WITH THE PATIENT. She is encouraged to take antibiotics as prescribed.  She is also encouraged to keep colonoscopy upcoming colonoscopy appointment. Again, encouraged to avoid foods which trigger her sx.  GREATER THAN 50%  FACE TO FACE TIME WAS SPENT IN COUNSELING AND COORDINATION OF CARE. ALL QUESTIONS WERE ANSWERED TO THE SATISFACTION OF THE PATIENT. I spent more than 25 minutes with the patient.   - CBC no Diff  2. Hypokalemia  I will check repeat potassium level today. Again, she would benefit from regular intake of Pedialyte (provided it does not trigger her bowel sx).   - BMP8+EGFR  3. Prolonged Q-T interval on ECG  Repeat EKG performed today, QTc-425, improved from hospital EKG. This was likely exacerbated by the hypokalemia associated with her diarrhea.   - EKG 12-Lead     Maximino Greenland, MD    THE PATIENT IS ENCOURAGED TO PRACTICE SOCIAL DISTANCING DUE TO THE COVID-19 PANDEMIC.

## 2019-09-21 ENCOUNTER — Other Ambulatory Visit: Payer: Self-pay | Admitting: Internal Medicine

## 2019-09-26 DIAGNOSIS — Z1159 Encounter for screening for other viral diseases: Secondary | ICD-10-CM | POA: Diagnosis not present

## 2019-09-26 DIAGNOSIS — E785 Hyperlipidemia, unspecified: Secondary | ICD-10-CM | POA: Diagnosis not present

## 2019-09-26 DIAGNOSIS — I10 Essential (primary) hypertension: Secondary | ICD-10-CM | POA: Diagnosis not present

## 2019-09-28 ENCOUNTER — Telehealth: Payer: Medicare Other

## 2019-09-30 ENCOUNTER — Ambulatory Visit: Payer: Medicare Other

## 2019-09-30 DIAGNOSIS — K529 Noninfective gastroenteritis and colitis, unspecified: Secondary | ICD-10-CM

## 2019-09-30 DIAGNOSIS — N183 Chronic kidney disease, stage 3 unspecified: Secondary | ICD-10-CM

## 2019-09-30 NOTE — Patient Instructions (Signed)
Social Worker Visit Information  Goals we discussed today:  Goals Addressed            This Visit's Progress   . Collaborate with RN Case Manager to perform appropriate assessments to determine care management and care coordination needs   On track    Current Barriers:  Marland Kitchen Knowledge barriers related to the independent self-health management of chronic conditions including HTN and DM II.  Marland Kitchen Ongoing Colitis infection impacting diet and ability to stay hydrated  Social Work Clinical Goal(s):  Marland Kitchen Over the next 60 days the patient will reduce ED visit and IP admissions by attending upcoming Colonoscopy appointment and following treatment recommendations made by her Gastroenterologist . Over the next 90 days the patient will collaborate with the care management team regarding on-going management of DM II and HTN  CCM SW Interventions: Completed 09/30/19 . Outbound call placed to the patient to assess progression of patient goal . Determined the patient is currently experiencing no symptoms of colitis o The patient reports she continues with bland diet . Encouraged the patient to contact the care management team as needed . Collaboration with RN Case Manager to update on patients progress . Scheduled follow up call over the next 60 days  Patient Self Care Activities:  . Patient verbalizes understanding of plan to follow up with care management team post colonoscopy appointment . Self administers medications as prescribed . Attends all scheduled provider appointments . Performs ADL's independently  Please see past updates related to this goal by clicking on the "Past Updates" button in the selected goal          Follow Up Plan: SW will follow up with patient by phone over the next 60 days   Daneen Schick, BSW, CDP Social Worker, Certified Dementia Practitioner [place] [facility] [place] [facility] [phone]

## 2019-09-30 NOTE — Chronic Care Management (AMB) (Signed)
Chronic Care Management    Social Work Follow Up Note  09/30/2019 Name: Anna Hoffman MRN: [id number] DOB: [birth year]  Anna Hoffman is a 85 y.o. year old female who is a primary care patient of Anna Chard, MD. The CCM team was consulted for assistance with care coordination.   Review of patient status, including review of consultants reports, other relevant assessments, and collaboration with appropriate care team members and the patient's provider was performed as part of comprehensive patient evaluation and provision of chronic care management services.    SDOH (Social Determinants of Health) assessments performed: No    SW placed a successful outbound call to the patient to assist with care coordination needs.  Outpatient Encounter Medications as of 09/30/2019  Medication Sig  . acetaminophen (TYLENOL) 325 MG tablet Take 2 tablets (650 mg total) by mouth every 6 (six) hours as needed for mild pain (or Fever >/= 101).  Marland Kitchen albuterol (VENTOLIN HFA) 108 (90 Base) MCG/ACT inhaler Inhale 1-2 puffs into the lungs every 4 (four) hours as needed for wheezing or shortness of breath. (Patient not taking: Reported on 09/05/2019)  . amLODipine (NORVASC) 10 MG tablet TAKE 1 TABLET BY MOUTH ONCE DAILY  . Ascorbic Acid (VITAMIN C) 1000 MG tablet Take 1,000 mg by mouth daily.  Marland Kitchen aspirin EC 81 MG tablet Take 81 mg by mouth daily.  Marland Kitchen azelastine (ASTELIN) 0.1 % nasal spray Place 2 sprays into both nostrils 2 (two) times daily. Use in each nostril as directed (Patient not taking: Reported on 09/05/2019)  . Carboxymethylcellul-Glycerin (LUBRICATING EYE DROPS OP) Apply 1 drop to eye daily as needed (dry eyes).   Marland Kitchen Fexofenadine HCl (ALLEGRA PO) Take 180 mg by mouth at bedtime.   Marland Kitchen glucose blood (ONETOUCH VERIO) test strip 1 each by Other route as needed for other. Use as instructed to check blood pressure 2 times per day dx: e11.65  . irbesartan (AVAPRO) 150 MG tablet TAKE 1 TABLET BY MOUTH EVERY DAY.  .  Multiple Vitamins-Minerals (STRESS TAB NF PO) Take 1 tablet by mouth daily.  Marland Kitchen olopatadine (PATANOL) 0.1 % ophthalmic solution Place 1 drop into both eyes 2 (two) times daily.  . rosuvastatin (CRESTOR) 20 MG tablet TAKE 1 TABLET BY MOUTH ONCE DAILY  . trolamine salicylate (ASPERCREME) 10 % cream Apply 1 application topically as needed for muscle pain.  . vitamin B-12 (CYANOCOBALAMIN) 1000 MCG tablet Take 1,000 mcg by mouth daily.   No facility-administered encounter medications on file as of 09/30/2019.     Goals Addressed            This Visit's Progress   . Collaborate with RN Case Manager to perform appropriate assessments to determine care management and care coordination needs   On track    Current Barriers:  Marland Kitchen Knowledge barriers related to the independent self-health management of chronic conditions including HTN and DM II.  Marland Kitchen Ongoing Colitis infection impacting diet and ability to stay hydrated  Social Work Clinical Goal(s):  Marland Kitchen Over the next 60 days the patient will reduce ED visit and IP admissions by attending upcoming Colonoscopy appointment and following treatment recommendations made by her Gastroenterologist . Over the next 90 days the patient will collaborate with the care management team regarding on-going management of DM II and HTN  CCM SW Interventions: Completed 09/30/19 . Outbound call placed to the patient to assess progression of patient goal . Determined the patient is currently experiencing no symptoms of colitis o The patient reports  she continues with bland diet . Encouraged the patient to contact the care management team as needed . Collaboration with RN Case Manager to update on patients progress . Scheduled follow up call over the next 60 days  Patient Self Care Activities:  . Patient verbalizes understanding of plan to follow up with care management team post colonoscopy appointment . Self administers medications as prescribed . Attends all scheduled  provider appointments . Performs ADL's independently  Please see past updates related to this goal by clicking on the "Past Updates" button in the selected goal          Follow Up Plan: SW will follow up with patient by phone over the next two months.  Anna Hoffman, BSW, CDP Social Worker, Certified Dementia Practitioner [place] [facility] [place] [facility] [phone]  Total time spent performing care coordination and/or care management activities with the patient by phone or face to face = 5 minutes.

## 2019-10-06 ENCOUNTER — Ambulatory Visit: Payer: Medicare Other | Attending: Internal Medicine

## 2019-10-06 DIAGNOSIS — Z23 Encounter for immunization: Secondary | ICD-10-CM | POA: Insufficient documentation

## 2019-10-06 NOTE — Progress Notes (Signed)
   Covid-19 Vaccination Clinic  Name:  Anna Hoffman    MRN: [id number] DOB: 1935/03/11  10/06/2019  [name] was observed post Covid-19 immunization for 15 minutes without incident. She was provided with Vaccine Information Sheet and instruction to access the V-Safe system.   [name] was instructed to call 911 with any severe reactions post vaccine: Marland Kitchen Difficulty breathing  . Swelling of face and throat  . A fast heartbeat  . A bad rash all over body  . Dizziness and weakness   Immunizations Administered    Name Date Dose VIS Date Route   Pfizer COVID-19 Vaccine 10/06/2019  9:03 AM 0.3 mL 07/15/2019 Intramuscular   Manufacturer: [place]   Lot: [id number]   [place]: [address]-1

## 2019-10-17 ENCOUNTER — Telehealth: Payer: Self-pay | Admitting: Allergy

## 2019-10-17 NOTE — Telephone Encounter (Signed)
Called and left a voicemail asking for patient to return call to discuss.  °

## 2019-10-17 NOTE — Telephone Encounter (Signed)
Called and spoke with the patient and she stated that she started coughing and throwing up and having lower back pain on Friday 10/14/2019. She stated that she has not thrown up since Saturday and she feels more "human" today. She was wondering if it could possible be a reaction from the COVID vaccine but her first dose was on March 4th. She did state that she has been in and out of the hospital for her GI issues and on March 1st her GI doctor performed a Colonoscopy and they discovered 20+ Polyps and she had them removed. She stated that she had Pneumonia last April and has maintained a cough since. She is wondering if the COVID vaccine is causing these symptoms. She confirmed that to her knowledge she has not been around anyone with COVID and that her children do not let her out of the house unless she is going to a doctor's appointment. Please advise since Dr. Nelva Bush is out of the office today. Thank You.

## 2019-10-17 NOTE — Telephone Encounter (Signed)
Patient called and said that she got her first covid shot March 4 and she got her shingles shot on Feb. The 28 and she is not feeling good. Said that she is coughing and throwing up. [phone]

## 2019-10-18 DIAGNOSIS — M170 Bilateral primary osteoarthritis of knee: Secondary | ICD-10-CM | POA: Diagnosis not present

## 2019-10-18 NOTE — Telephone Encounter (Signed)
Can you please let this patient know that Dr. Nelva Bush and I agree that the vomiting was not likely from a vaccine reaction and we would recommend moving forward with the second vaccine. Should she choose to get the second vaccine she should go to a place where they have the ability to treat a reaction such as the health department or a large setting like the [place] and stay the full 30 minutes in the observation area.

## 2019-10-18 NOTE — Telephone Encounter (Signed)
Called and spoke with patient and relayed message. Patient verbalized understanding.

## 2019-10-19 ENCOUNTER — Other Ambulatory Visit: Payer: Self-pay

## 2019-10-19 ENCOUNTER — Telehealth: Payer: Self-pay | Admitting: Allergy

## 2019-10-19 ENCOUNTER — Telehealth: Payer: Medicare Other

## 2019-10-19 MED ORDER — ONETOUCH VERIO VI STRP: 1.0000 | ORAL_STRIP | 11 refills | Status: DC | PRN

## 2019-10-19 MED ORDER — ONETOUCH ULTRASOFT LANCETS MISC: 12 refills | Status: DC

## 2019-10-19 MED FILL — ONE TOUCH ULTRASOFT LANCETS: 50 days supply | Qty: 100 | Fill #0

## 2019-10-19 MED FILL — ONE TOUCH VERIO TEST STRIP: 50 days supply | Qty: 100 | Fill #0

## 2019-10-19 NOTE — Telephone Encounter (Signed)
Called and left a voicemail asking for patient to return call to discuss. When patient calls back will need to confirm which pharmacy she would like for the [facility] sent to.

## 2019-10-19 NOTE — Telephone Encounter (Signed)
I called and spoke with patient and she stated that she did vomit this morning after eating breakfast and she is taking her Reflux medicine as prescribed. She denies any drainage or nasal congestion and she did confirm that she is using her Astelin nasal spray. She is still thinking if this could be contributed from the COVID or Shingles vaccine and that she is having chest pain in the center of her chest which concerns her. Please advise.

## 2019-10-19 NOTE — Telephone Encounter (Signed)
Since the cough seems to be tied to when she is eating or drinking does she feel that she is having increased nasal congestion and drainage as well? As she needed to use the albuterol inhaler that she has because of his cough? She is using her Astelin nose spray 2 sprays each nostril twice a day at this time?  Typically cough is related to eating or drinking can either be an issue with swallowing or she could have mucus production with causing the postnasal drainage that is triggering the cough as well.  Make sure she is using her nasal sprays.  She has not tried use of the albuterol would do that as well to see if this helps subside the cough.

## 2019-10-19 NOTE — Telephone Encounter (Signed)
Dr. Nelva Bush please advise. Please refer to telephone contact from 10/17/2019 regarding cough. Thank  You.

## 2019-10-19 NOTE — Telephone Encounter (Signed)
Patient called and said that she is still coughing and it starts when she is eating or drinking something. She needs something to help with the cough. [place] [facility] [phone].

## 2019-10-19 NOTE — Telephone Encounter (Signed)
I do not think symptoms are related to the covid vaccine.  We can have her try tessalon perles 100mg  1 perle 3 times a day as needed for cough, disp 30 perls. She should have an albuterol inhaler she can also try use of this to see if it helps abate cough.    Has she discussed her history of vomiting with her GI doctor? Chest pain may be related to coughing if she has been coughing a lot.  However would recommend she discuss chest pain symptoms with PCP.

## 2019-10-21 MED ORDER — BENZONATATE 100 MG PO CAPS: 100.0000 mg | ORAL_CAPSULE | Freq: Three times a day (TID) | ORAL | 0 refills | Status: DC | PRN

## 2019-10-21 MED FILL — BENZONATATE 100 MG CAPS: 100 | 10 days supply | Qty: 30 | Fill #0

## 2019-10-21 NOTE — Telephone Encounter (Signed)
Called and spoke with patient and she stated that she is starting to feel some better. Patient states that she is not vomiting anymore but has been having diarrhea. Advised to patient to follow up GI doctor if this persists. Patient verbalized understanding. A prescription for Tessalon pearls has been sent to the [facility]. Patient verbalized understanding and will call back if she needs anything further.

## 2019-10-21 NOTE — Addendum Note (Signed)
Addended by: Chip Boer R on: 10/21/2019 09:55 AM   Modules accepted: Orders

## 2019-10-31 ENCOUNTER — Ambulatory Visit: Payer: Medicare Other | Attending: Internal Medicine

## 2019-10-31 DIAGNOSIS — Z23 Encounter for immunization: Secondary | ICD-10-CM

## 2019-10-31 NOTE — Progress Notes (Signed)
   Covid-19 Vaccination Clinic  Name:  Azharia Surratt    MRN: [id number] DOB: 1935/02/06  10/31/2019  Ms. Grizzell was observed post Covid-19 immunization for 15 minutes without incident. She was provided with Vaccine Information Sheet and instruction to access the V-Safe system.   Ms. Alia was instructed to call 911 with any severe reactions post vaccine: Marland Kitchen Difficulty breathing  . Swelling of face and throat  . A fast heartbeat  . A bad rash all over body  . Dizziness and weakness   Immunizations Administered    Name Date Dose VIS Date Route   Pfizer COVID-19 Vaccine 10/31/2019  9:15 AM 0.3 mL 07/15/2019 Intramuscular   Manufacturer: [place]   Lot: [id number]   [place]: [address]-7

## 2019-11-08 ENCOUNTER — Ambulatory Visit: Payer: Medicare Other | Admitting: Internal Medicine

## 2019-11-10 ENCOUNTER — Ambulatory Visit (INDEPENDENT_AMBULATORY_CARE_PROVIDER_SITE_OTHER): Payer: Medicare Other | Admitting: Internal Medicine

## 2019-11-10 ENCOUNTER — Other Ambulatory Visit: Payer: Self-pay

## 2019-11-10 ENCOUNTER — Encounter: Payer: Self-pay | Admitting: Internal Medicine

## 2019-11-10 ENCOUNTER — Telehealth: Payer: Self-pay | Admitting: Gastroenterology

## 2019-11-10 VITALS — BP 130/80 | HR 105 | Temp 98.1°F | Ht 63.2 in | Wt 143.2 lb

## 2019-11-10 DIAGNOSIS — Z6825 Body mass index (BMI) 25.0-25.9, adult: Secondary | ICD-10-CM

## 2019-11-10 DIAGNOSIS — E1122 Type 2 diabetes mellitus with diabetic chronic kidney disease: Secondary | ICD-10-CM | POA: Diagnosis not present

## 2019-11-10 DIAGNOSIS — E663 Overweight: Secondary | ICD-10-CM | POA: Diagnosis not present

## 2019-11-10 DIAGNOSIS — N183 Chronic kidney disease, stage 3 unspecified: Secondary | ICD-10-CM

## 2019-11-10 DIAGNOSIS — I129 Hypertensive chronic kidney disease with stage 1 through stage 4 chronic kidney disease, or unspecified chronic kidney disease: Secondary | ICD-10-CM

## 2019-11-10 MED FILL — OMEPRAZOLE DR 20 MG CAPSULE: 20 | 90 days supply | Qty: 90 | Fill #1

## 2019-11-10 MED FILL — AMLODIPINE BESYLATE 10 MG T: 10 | 90 days supply | Qty: 90 | Fill #1

## 2019-11-10 MED FILL — ROSUVASTATIN CALCIUM 20 MG: 20 | 90 days supply | Qty: 90 | Fill #1

## 2019-11-10 NOTE — Telephone Encounter (Signed)
The pt complains of increasing gas since having the COVID vaccine.  She has also been eating a lot of vegetables.  We discussed her diet and I advised her that she can try gas ex before meals and if that does not help to call back.  The pt has been advised of the information and verbalized understanding.

## 2019-11-11 LAB — BMP8+EGFR
BUN/Creatinine Ratio: 21 (ref 12–28)
BUN: 23 mg/dL (ref 8–27)
CO2: 21 mmol/L (ref 20–29)
Calcium: 9.9 mg/dL (ref 8.7–10.3)
Chloride: 105 mmol/L (ref 96–106)
Creatinine, Ser: 1.07 mg/dL — ABNORMAL HIGH (ref 0.57–1.00)
GFR calc Af Amer: 56 mL/min/{1.73_m2} — ABNORMAL LOW (ref >59)
GFR calc non Af Amer: 48 mL/min/{1.73_m2} — ABNORMAL LOW (ref >59)
Glucose: 121 mg/dL — ABNORMAL HIGH (ref 65–99)
Potassium: 4.3 mmol/L (ref 3.5–5.2)
Sodium: 142 mmol/L (ref 134–144)

## 2019-11-11 LAB — HEMOGLOBIN A1C
Est. average glucose Bld gHb Est-mCnc: 134 mg/dL
Hgb A1c MFr Bld: 6.3 % — ABNORMAL HIGH (ref 4.8–5.6)

## 2019-11-13 NOTE — Progress Notes (Signed)
This visit occurred during the SARS-CoV-2 public health emergency.  Safety protocols were in place, including screening questions prior to the visit, additional usage of staff PPE, and extensive cleaning of exam room while observing appropriate contact time as indicated for disinfecting solutions.  Subjective:     Patient ID: Anna Hoffman , female    DOB: November 03, 1934 , 85 y.o.   MRN: [id number]   Chief Complaint  Patient presents with  . Hypertension    HPI    She presents BP/DM check. She reports compliance with meds. She reports she has not felt too well since getting COVID vaccine. She got her second vaccine on 3/29. She adds that she got her first shingles vaccine in January.   Hypertension This is a chronic problem. The current episode started more than 1 year ago. The problem has been gradually improving since onset. The problem is controlled. Pertinent negatives include no blurred vision, chest pain, palpitations or shortness of breath. Risk factors for coronary artery disease include diabetes mellitus, dyslipidemia, post-menopausal state and sedentary lifestyle.  Diabetes She presents for her follow-up diabetic visit. She has type 2 diabetes mellitus. Her disease course has been stable. There are no hypoglycemic associated symptoms. Pertinent negatives for diabetes include no blurred vision and no chest pain. There are no hypoglycemic complications. Risk factors for coronary artery disease include diabetes mellitus, dyslipidemia, hypertension, stress and post-menopausal. She is compliant with treatment most of the time. Her weight is stable. She is following a diabetic diet. Her breakfast blood glucose is taken between 7-8 am. Her breakfast blood glucose range is generally 90-110 mg/dl.     Past Medical History:  Diagnosis Date  . Arthritis   . Diabetes mellitus without complication ([place])   . Hypertension   . Hypokalemia   . Pneumonia   . Shingles   . Stroke [facility])       Family History  Problem Relation Age of Onset  . Alzheimer's disease Mother   . Prostate cancer Father   . Brain cancer Brother   . Brain cancer Brother   . Pancreatic cancer Sister   . Allergic rhinitis Neg Hx   . Asthma Neg Hx   . Eczema Neg Hx   . Urticaria Neg Hx      Current Outpatient Medications:  .  acetaminophen (TYLENOL) 325 MG tablet, Take 2 tablets (650 mg total) by mouth every 6 (six) hours as needed for mild pain (or Fever >/= 101)., Disp: , Rfl:  .  albuterol (VENTOLIN HFA) 108 (90 Base) MCG/ACT inhaler, Inhale 1-2 puffs into the lungs every 4 (four) hours as needed for wheezing or shortness of breath. (Patient not taking: Reported on 09/05/2019), Disp: 18 g, Rfl: 1 .  amLODipine (NORVASC) 10 MG tablet, TAKE 1 TABLET BY MOUTH ONCE DAILY, Disp: 90 tablet, Rfl: 1 .  Ascorbic Acid (VITAMIN C) 1000 MG tablet, Take 1,000 mg by mouth daily., Disp: , Rfl:  .  aspirin EC 81 MG tablet, Take 81 mg by mouth daily., Disp: , Rfl:  .  azelastine (ASTELIN) 0.1 % nasal spray, Place 2 sprays into both nostrils 2 (two) times daily. Use in each nostril as directed (Patient not taking: Reported on 09/05/2019), Disp: 30 mL, Rfl: 5 .  benzonatate (TESSALON PERLES) 100 MG capsule, Take 1 capsule (100 mg total) by mouth 3 (three) times daily as needed for cough., Disp: 30 capsule, Rfl: 0 .  Carboxymethylcellul-Glycerin (LUBRICATING EYE DROPS OP), Apply 1 drop to eye daily as  needed (dry eyes). , Disp: , Rfl:  .  Fexofenadine HCl (ALLEGRA PO), Take 180 mg by mouth at bedtime. , Disp: , Rfl:  .  glucose blood (ONETOUCH VERIO) test strip, 1 each by Other route as needed for other. Use as instructed to check blood sugar 2 times per day dx: e11.65, Disp: 100 each, Rfl: 11 .  irbesartan (AVAPRO) 150 MG tablet, TAKE 1 TABLET BY MOUTH EVERY DAY., Disp: 90 tablet, Rfl: 0 .  Lancets (ONETOUCH ULTRASOFT) lancets, Use as instructed to check blood sugar 2 times per day dx code e11.65, Disp: 100 each, Rfl: 12 .   Multiple Vitamins-Minerals (STRESS TAB NF PO), Take 1 tablet by mouth daily., Disp: , Rfl:  .  olopatadine (PATANOL) 0.1 % ophthalmic solution, Place 1 drop into both eyes 2 (two) times daily., Disp: 5 mL, Rfl: 5 .  rosuvastatin (CRESTOR) 20 MG tablet, TAKE 1 TABLET BY MOUTH ONCE DAILY, Disp: 90 tablet, Rfl: 1 .  trolamine salicylate (ASPERCREME) 10 % cream, Apply 1 application topically as needed for muscle pain., Disp: , Rfl:  .  vitamin B-12 (CYANOCOBALAMIN) 1000 MCG tablet, Take 1,000 mcg by mouth daily., Disp: , Rfl:    Allergies  Allergen Reactions  . Amoxicillin Diarrhea    Has patient had a PCN reaction causing immediate rash, facial/tongue/throat swelling, SOB or lightheadedness with hypotension: no Has patient had a PCN reaction causing severe rash involving mucus membranes or skin necrosis: no Has patient had a PCN reaction that required hospitalization: no pharmacist consult Has patient had a PCN reaction occurring within the last 10 years: yes If all of the above answers are "NO", then may proceed with Cephalosporin use.   . Ampicillin Rash    - Tolerates Rocephin and Ancef - Remote occurrence; no symptoms of anaphylaxis or severe cutaneous reaction, and no additional medical attention required    . Sulfa Antibiotics Rash     Review of Systems  Constitutional: Negative.   Eyes: Negative for blurred vision.  Respiratory: Negative.  Negative for shortness of breath.   Cardiovascular: Negative.  Negative for chest pain and palpitations.  Gastrointestinal: Negative.   Neurological: Negative.   Psychiatric/Behavioral: Negative.      Today's Vitals   11/10/19 1410  BP: 130/80  Pulse: (!) 105  Temp: 98.1 F (36.7 C)  TempSrc: Oral  Weight: 143 lb 3.2 oz (65 kg)  Height: 5' 3.2" (1.605 m)   Body mass index is 25.21 kg/m.   Objective:  Physical Exam Vitals and nursing note reviewed.  Constitutional:      Appearance: Normal appearance.  HENT:     Head:  Normocephalic and atraumatic.  Cardiovascular:     Rate and Rhythm: Normal rate and regular rhythm.     Heart sounds: Normal heart sounds.  Pulmonary:     Effort: Pulmonary effort is normal.     Breath sounds: Normal breath sounds.  Skin:    General: Skin is warm.  Neurological:     General: No focal deficit present.     Mental Status: She is alert.  Psychiatric:        Mood and Affect: Mood normal.        Behavior: Behavior normal.         Assessment And Plan:     1. Hypertensive nephropathy  Chronic, fair control. She will continue with current meds. She is encouraged to avoid adding salt to her foods.   2. Type 2 diabetes mellitus with stage 3  chronic kidney disease, without long-term current use of insulin, unspecified whether stage 3a or 3b CKD ([name])  Chronic. I will check labs as listed below. Importance of dietary compliance was discussed with the patient. She is also encouraged to stay well hydrated. She is aware that good BP/BS control along with adequate hydration could prevent progression of her CKD.   - BMP8+EGFR - Hemoglobin A1c   3. Overweight with body mass index (BMI) of 25 to 25.9 in adult  Her weight is stable for her demographic.   Maximino Greenland, MD    THE PATIENT IS ENCOURAGED TO PRACTICE SOCIAL DISTANCING DUE TO THE COVID-19 PANDEMIC.

## 2019-11-15 ENCOUNTER — Telehealth: Payer: Self-pay

## 2019-11-15 NOTE — Telephone Encounter (Signed)
Called to offer pt appointment for back pain. She said that she is going to see a Restaurant manager, fast food tomorrow

## 2019-11-17 ENCOUNTER — Ambulatory Visit: Payer: Medicare Other

## 2019-11-17 DIAGNOSIS — M9903 Segmental and somatic dysfunction of lumbar region: Secondary | ICD-10-CM | POA: Diagnosis not present

## 2019-11-17 DIAGNOSIS — M545 Low back pain: Secondary | ICD-10-CM | POA: Diagnosis not present

## 2019-11-17 DIAGNOSIS — N183 Chronic kidney disease, stage 3 unspecified: Secondary | ICD-10-CM

## 2019-11-17 DIAGNOSIS — I129 Hypertensive chronic kidney disease with stage 1 through stage 4 chronic kidney disease, or unspecified chronic kidney disease: Secondary | ICD-10-CM

## 2019-11-17 NOTE — Chronic Care Management (AMB) (Signed)
  Chronic Care Management   Outreach Note  11/17/2019 Name: Anna Hoffman MRN: [id number] DOB: [birth year]  Referred by: Glendale Chard, MD Reason for referral : Care Coordination   SW placed a successful outbound call to the patient to assist with care coordination needs. The patient reported she was unable to complete today's call as she was in a lot of back pain and headed to see her chiropractor.  Follow Up Plan: The care management team will reach out to the patient again over the next 7 days.   Daneen Schick, BSW, CDP Social Worker, Certified Dementia Practitioner [place] [facility] [place] [facility] [phone]

## 2019-11-22 ENCOUNTER — Ambulatory Visit: Payer: Self-pay

## 2019-11-22 ENCOUNTER — Telehealth: Payer: Medicare Other

## 2019-11-22 DIAGNOSIS — I129 Hypertensive chronic kidney disease with stage 1 through stage 4 chronic kidney disease, or unspecified chronic kidney disease: Secondary | ICD-10-CM

## 2019-11-22 DIAGNOSIS — E1122 Type 2 diabetes mellitus with diabetic chronic kidney disease: Secondary | ICD-10-CM

## 2019-11-22 DIAGNOSIS — M545 Low back pain: Secondary | ICD-10-CM | POA: Diagnosis not present

## 2019-11-22 DIAGNOSIS — M9903 Segmental and somatic dysfunction of lumbar region: Secondary | ICD-10-CM | POA: Diagnosis not present

## 2019-11-22 DIAGNOSIS — R6889 Other general symptoms and signs: Secondary | ICD-10-CM | POA: Diagnosis not present

## 2019-11-22 NOTE — Chronic Care Management (AMB) (Signed)
  Chronic Care Management   Outreach Note  11/22/2019 Name: Anna Hoffman MRN: [id number] DOB: 1934/12/21  Referred by: Glendale Chard, MD Reason for referral : Care Coordination   SW placed a second unsuccessful outbound call to the patient in an effort to assist with ongoing care management and care coordination needs. SW left a HIPAA compliant voice message requesting a return call.  Follow Up Plan: SW will attempt a third and final outreach over the next 14 days.  Daneen Schick, BSW, CDP Social Worker, Certified Dementia Practitioner [place] [facility] [place] [facility] [phone]

## 2019-11-25 DIAGNOSIS — M9903 Segmental and somatic dysfunction of lumbar region: Secondary | ICD-10-CM | POA: Diagnosis not present

## 2019-11-25 DIAGNOSIS — M545 Low back pain: Secondary | ICD-10-CM | POA: Diagnosis not present

## 2019-11-29 DIAGNOSIS — R6889 Other general symptoms and signs: Secondary | ICD-10-CM | POA: Diagnosis not present

## 2019-11-29 DIAGNOSIS — M9903 Segmental and somatic dysfunction of lumbar region: Secondary | ICD-10-CM | POA: Diagnosis not present

## 2019-11-29 DIAGNOSIS — M545 Low back pain: Secondary | ICD-10-CM | POA: Diagnosis not present

## 2019-11-30 ENCOUNTER — Emergency Department (HOSPITAL_COMMUNITY)
Admission: EM | Admit: 2019-11-30 | Discharge: 2019-12-01 | Disposition: A | Discharge status: Home / Self Care | Payer: Medicare Other | Attending: Emergency Medicine | Admitting: Emergency Medicine

## 2019-11-30 ENCOUNTER — Other Ambulatory Visit: Payer: Self-pay

## 2019-11-30 ENCOUNTER — Emergency Department (HOSPITAL_COMMUNITY): Payer: Medicare Other

## 2019-11-30 DIAGNOSIS — I10 Essential (primary) hypertension: Secondary | ICD-10-CM | POA: Insufficient documentation

## 2019-11-30 DIAGNOSIS — Z7982 Long term (current) use of aspirin: Secondary | ICD-10-CM | POA: Diagnosis not present

## 2019-11-30 DIAGNOSIS — R112 Nausea with vomiting, unspecified: Secondary | ICD-10-CM | POA: Diagnosis present

## 2019-11-30 DIAGNOSIS — Z7984 Long term (current) use of oral hypoglycemic drugs: Secondary | ICD-10-CM | POA: Insufficient documentation

## 2019-11-30 DIAGNOSIS — Z79899 Other long term (current) drug therapy: Secondary | ICD-10-CM | POA: Diagnosis not present

## 2019-11-30 DIAGNOSIS — E119 Type 2 diabetes mellitus without complications: Secondary | ICD-10-CM | POA: Diagnosis not present

## 2019-11-30 DIAGNOSIS — K529 Noninfective gastroenteritis and colitis, unspecified: Secondary | ICD-10-CM | POA: Diagnosis not present

## 2019-11-30 DIAGNOSIS — R05 Cough: Secondary | ICD-10-CM | POA: Diagnosis not present

## 2019-11-30 DIAGNOSIS — R55 Syncope and collapse: Secondary | ICD-10-CM | POA: Diagnosis not present

## 2019-11-30 LAB — CBC
HCT: 41.2 % (ref 36.0–46.0)
Hemoglobin: 13.3 g/dL (ref 12.0–15.0)
MCH: 28.9 pg (ref 26.0–34.0)
MCHC: 32.3 g/dL (ref 30.0–36.0)
MCV: 89.6 fL (ref 80.0–100.0)
Platelets: 343 10*3/uL (ref 150–400)
RBC: 4.6 MIL/uL (ref 3.87–5.11)
RDW: 15.5 % (ref 11.5–15.5)
WBC: 12.4 10*3/uL — ABNORMAL HIGH (ref 4.0–10.5)
nRBC: 0 % (ref 0.0–0.2)

## 2019-11-30 LAB — COMPREHENSIVE METABOLIC PANEL
ALT: 21 U/L (ref 0–44)
AST: 34 U/L (ref 15–41)
Albumin: 4.6 g/dL (ref 3.5–5.0)
Alkaline Phosphatase: 79 U/L (ref 38–126)
Anion gap: 15 (ref 5–15)
BUN: 19 mg/dL (ref 8–23)
CO2: 19 mmol/L — ABNORMAL LOW (ref 22–32)
Calcium: 9.3 mg/dL (ref 8.9–10.3)
Chloride: 105 mmol/L (ref 98–111)
Creatinine, Ser: 1.39 mg/dL — ABNORMAL HIGH (ref 0.44–1.00)
GFR calc Af Amer: 41 mL/min — ABNORMAL LOW (ref >60)
GFR calc non Af Amer: 35 mL/min — ABNORMAL LOW (ref >60)
Glucose, Bld: 147 mg/dL — ABNORMAL HIGH (ref 70–99)
Potassium: 3.7 mmol/L (ref 3.5–5.1)
Sodium: 139 mmol/L (ref 135–145)
Total Bilirubin: 0.9 mg/dL (ref 0.3–1.2)
Total Protein: 7.9 g/dL (ref 6.5–8.1)

## 2019-11-30 LAB — LIPASE, BLOOD: Lipase: 16 U/L (ref 11–51)

## 2019-11-30 MED ORDER — SODIUM CHLORIDE 0.9% FLUSH: 3.0000 mL | Freq: Once | INTRAVENOUS | Status: DC

## 2019-11-30 MED ORDER — SODIUM CHLORIDE 0.9 % IV BOLUS
1000.0000 mL | Freq: Once | INTRAVENOUS | Status: AC
  Administered 2019-11-30: 1000 mL via INTRAVENOUS

## 2019-11-30 NOTE — ED Provider Notes (Signed)
[facility] DEPT Provider Note   CSN: [id number] Arrival date & time: 11/30/19  1856     History Chief Complaint  Patient presents with  . Nausea    Anna Hoffman is a 85 y.o. female.  Patient is an 85 year old female with history of diabetes, hypertension presenting with complaints of nausea, vomiting, and diarrhea since yesterday.  Patient states that she took the medical transport Lucianne Lei to an appointment to her doctor and believes she may have caught a GI bug there.  She reports nausea, vomiting, and weakness since.  She also is having loose stools.  All has been nonbloody.  She denies fevers or chills.  She denies having consumed any suspicious foods.  The history is provided by the patient.       Past Medical History:  Diagnosis Date  . Arthritis   . Diabetes mellitus without complication ([place])   . Hypertension   . Hypokalemia   . Pneumonia   . Shingles   . Stroke [facility])     Patient Active Problem List   Diagnosis Date Noted  . Colitis, acute 08/11/2019  . Colitis 07/11/2019  . Loose stools 06/07/2019  . Herpes zoster without complication 44/81/8563  . Other long term (current) drug therapy 08/12/2018  . Chronic renal disease, stage III 07/19/2018  . Hypertensive nephropathy 07/19/2018  . Community acquired pneumonia of right upper lobe of lung 11/07/2017  . Hypertensive emergency 07/01/2016  . 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 Benign essential HTN   . Type 2 diabetes mellitus with stage 3 chronic kidney disease, without long-term current use of insulin ([place])   . Mixed hyperlipidemia   . ICH (intracerebral hemorrhage) ([place]) - hypertensive R thalamic hemorrhage  06/27/2016  . 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 Retroperitoneal bleed 02/08/2016  . Generalized abdominal pain   . Gastroenteritis 02/04/2016  . Diarrhea 02/04/2016  . Hypokalemia 02/04/2016  . Incarcerated  paraesophageal hernia 09/02/2014  . Renal mass, left 09/02/2014  . Acute esophagitis 09/02/2014  . Gastric outlet obstruction 09/02/2014  . Hypertension   . Vomiting 09/01/2014    Past Surgical History:  Procedure Laterality Date  . ABDOMINAL HYSTERECTOMY    . BREAST EXCISIONAL BIOPSY Left   . ESOPHAGOGASTRODUODENOSCOPY N/A 09/01/2014   Procedure: ESOPHAGOGASTRODUODENOSCOPY (EGD);  Surgeon: Lear Ng, MD;  Location: [facility];  Service: Endoscopy;  Laterality: N/A;  . HIATAL HERNIA REPAIR N/A 09/04/2014   Procedure: LAPAROSCOPIC REPAIR OF HIATAL HERNIA;  Surgeon: Excell Seltzer, MD;  Location: [facility];  Service: General;  Laterality: N/A;  With MESH  . IR GENERIC HISTORICAL  04/10/2016   IR US GUIDE VASC ACCESS RIGHT 04/10/2016 Corrie Mckusick, DO [facility]  . IR GENERIC HISTORICAL  04/10/2016   IR ANGIOGRAM SELECTIVE EACH ADDITIONAL VESSEL 04/10/2016 Corrie Mckusick, DO [facility]  . IR GENERIC HISTORICAL  04/10/2016   IR ANGIOGRAM SELECTIVE EACH ADDITIONAL VESSEL 04/10/2016 Corrie Mckusick, DO [facility]  . IR GENERIC HISTORICAL  04/10/2016   IR EMBO TUMOR ORGAN ISCHEMIA INFARCT INC GUIDE ROADMAPPING 04/10/2016 Corrie Mckusick, DO [facility]  . IR GENERIC HISTORICAL  04/10/2016   IR ANGIOGRAM SELECTIVE EACH ADDITIONAL VESSEL 04/10/2016 Corrie Mckusick, DO [facility]  . IR GENERIC HISTORICAL  04/10/2016   IR ANGIOGRAM SELECTIVE EACH ADDITIONAL VESSEL 04/10/2016 Corrie Mckusick, DO [facility]  . IR GENERIC HISTORICAL  04/10/2016   IR RENAL SELECTIVE  UNI INC S&I MOD SED 04/10/2016 [place] Cerise  Earleen Newport, DO [facility]  . IR GENERIC HISTORICAL  03/06/2016   IR RADIOLOGIST EVAL & MGMT 03/06/2016 Corrie Mckusick, DO [facility]  . IR GENERIC HISTORICAL  03/26/2016   IR RADIOLOGIST EVAL & MGMT 03/26/2016 Corrie Mckusick, DO [facility]  . IR GENERIC HISTORICAL  04/29/2016   IR RADIOLOGIST EVAL & MGMT 04/29/2016 [facility]  . IR RADIOLOGIST EVAL & MGMT  11/12/2016  . IR RADIOLOGIST EVAL & MGMT   12/16/2017  . KNEE SURGERY    . TONSILLECTOMY       OB History   No obstetric history on file.     Family History  Problem Relation Age of Onset  . Alzheimer's disease Mother   . Prostate cancer Father   . Brain cancer Brother   . Brain cancer Brother   . Pancreatic cancer Sister   . Allergic rhinitis Neg Hx   . Asthma Neg Hx   . Eczema Neg Hx   . Urticaria Neg Hx     Social History   Tobacco Use  . Smoking status: Never Smoker  . Smokeless tobacco: Never Used  Substance Use Topics  . Alcohol use: No  . Drug use: No    Home Medications Prior to Admission medications   Medication Sig Start Date End Date Taking? Authorizing Provider  acetaminophen (TYLENOL) 325 MG tablet Take 2 tablets (650 mg total) by mouth every 6 (six) hours as needed for mild pain (or Fever >/= 101). 02/08/16   Johnson, Clanford L, MD  albuterol (VENTOLIN HFA) 108 (90 Base) MCG/ACT inhaler Inhale 1-2 puffs into the lungs every 4 (four) hours as needed for wheezing or shortness of breath. Patient not taking: Reported on 09/05/2019 06/15/19   Kennith Gain, MD  amLODipine (NORVASC) 10 MG tablet TAKE 1 TABLET BY MOUTH ONCE DAILY 08/22/19   Glendale Chard, MD  Ascorbic Acid (VITAMIN C) 1000 MG tablet Take 1,000 mg by mouth daily.    [provider]  aspirin EC 81 MG tablet Take 81 mg by mouth daily.    [provider]  azelastine (ASTELIN) 0.1 % nasal spray Place 2 sprays into both nostrils 2 (two) times daily. Use in each nostril as directed Patient not taking: Reported on 09/05/2019 10/06/18   Kennith Gain, MD  benzonatate (TESSALON PERLES) 100 MG capsule Take 1 capsule (100 mg total) by mouth 3 (three) times daily as needed for cough. 10/21/19   Kennith Gain, MD  Carboxymethylcellul-Glycerin (LUBRICATING EYE DROPS OP) Apply 1 drop to eye daily as needed (dry eyes).     [provider]  Fexofenadine HCl (ALLEGRA PO) Take 180 mg by mouth at bedtime.      [provider]  glucose blood (ONETOUCH VERIO) test strip 1 each by Other route as needed for other. Use as instructed to check blood sugar 2 times per day dx: e11.65 10/19/19   Glendale Chard, MD  irbesartan (AVAPRO) 150 MG tablet TAKE 1 TABLET BY MOUTH EVERY DAY. 09/21/19   Glendale Chard, MD  Lancets [facility] ULTRASOFT) lancets Use as instructed to check blood sugar 2 times per day dx code e11.65 10/19/19   Glendale Chard, MD  Multiple Vitamins-Minerals (STRESS TAB NF PO) Take 1 tablet by mouth daily.    [provider]  olopatadine (PATANOL) 0.1 % ophthalmic solution Place 1 drop into both eyes 2 (two) times daily. 06/15/19   Kennith Gain, MD  rosuvastatin (CRESTOR) 20 MG tablet TAKE 1 TABLET  BY MOUTH ONCE DAILY 08/22/19   Glendale Chard, MD  trolamine salicylate (ASPERCREME) 10 % cream Apply 1 application topically as needed for muscle pain.    [provider]  vitamin B-12 (CYANOCOBALAMIN) 1000 MCG tablet Take 1,000 mcg by mouth daily.    [provider]    Allergies    Amoxicillin, Ampicillin, and Sulfa antibiotics  Review of Systems   Review of Systems  All other systems reviewed and are negative.   Physical Exam Updated Vital Signs BP (!) 155/78 (BP Location: Left Arm)   Pulse (!) 115   Temp 98 F (36.7 C) (Oral)   Resp 18   Ht 5' 5.5" (1.664 m)   Wt 62.6 kg   LMP  (LMP Unknown)   SpO2 97%   BMI 22.62 kg/m   Physical Exam Vitals and nursing note reviewed.  Constitutional:      General: She is not in acute distress.    Appearance: She is well-developed. She is not diaphoretic.  HENT:     Head: Normocephalic and atraumatic.  Cardiovascular:     Rate and Rhythm: Normal rate and regular rhythm.     Heart sounds: No murmur. No friction rub. No gallop.   Pulmonary:     Effort: Pulmonary effort is normal. No respiratory distress.     Breath sounds: Normal breath sounds. No wheezing.  Abdominal:     General: Bowel sounds  are normal. There is no distension.     Palpations: Abdomen is soft.     Tenderness: There is no abdominal tenderness. There is no right CVA tenderness or left CVA tenderness.  Musculoskeletal:        General: Normal range of motion.     Cervical back: Normal range of motion and neck supple.  Skin:    General: Skin is warm and dry.  Neurological:     Mental Status: She is alert and oriented to person, place, and time.     ED Results / Procedures / Treatments   Labs (all labs ordered are listed, but only abnormal results are displayed) Labs Reviewed  COMPREHENSIVE METABOLIC PANEL - Abnormal; Notable for the following components:      Result Value   CO2 19 (*)    Glucose, Bld 147 (*)    Creatinine, Ser 1.39 (*)    GFR calc non Af Amer 35 (*)    GFR calc Af Amer 41 (*)    All other components within normal limits  CBC - Abnormal; Notable for the following components:   WBC 12.4 (*)    All other components within normal limits  LIPASE, BLOOD  URINALYSIS, ROUTINE W REFLEX MICROSCOPIC    EKG None  Radiology No results found.  Procedures Procedures (including critical care time)  Medications Ordered in ED Medications  sodium chloride flush (NS) 0.9 % injection 3 mL (has no administration in time range)  sodium chloride 0.9 % bolus 1,000 mL (has no administration in time range)    ED Course  I have reviewed the triage vital signs and the nursing notes.  Pertinent labs & imaging results that were available during my care of the patient were reviewed by me and considered in my medical decision making (see chart for details).    MDM Rules/Calculators/A&P  Patient is an 85 year old female presenting with complaints of feeling lightheaded after experiencing nausea, vomiting, diarrhea yesterday.  Patient's vital signs are stable and laboratory studies are reflective of perhaps a mild dehydration.  She was  given IV fluids and monitored in the ER.  She seems to be feeling  better.  Her abdomen is benign and I do not feel as though any further workup necessary at this time.    Will discharged, to return as needed.  Final Clinical Impression(s) / ED Diagnoses Final diagnoses:  None    Rx / DC Orders ED Discharge Orders    None       Veryl Speak, MD 12/01/19 [phone]

## 2019-11-30 NOTE — ED Triage Notes (Signed)
Pt sts she was feeling sick all day after a visit to the doctor yesterday. Pt feeling "lightheaded" after N/V/D

## 2019-12-01 ENCOUNTER — Ambulatory Visit: Payer: Self-pay

## 2019-12-01 ENCOUNTER — Telehealth: Payer: Self-pay | Admitting: Gastroenterology

## 2019-12-01 ENCOUNTER — Telehealth: Payer: Medicare Other

## 2019-12-01 DIAGNOSIS — N183 Chronic kidney disease, stage 3 unspecified: Secondary | ICD-10-CM

## 2019-12-01 DIAGNOSIS — I129 Hypertensive chronic kidney disease with stage 1 through stage 4 chronic kidney disease, or unspecified chronic kidney disease: Secondary | ICD-10-CM

## 2019-12-01 DIAGNOSIS — E1122 Type 2 diabetes mellitus with diabetic chronic kidney disease: Secondary | ICD-10-CM

## 2019-12-01 NOTE — Telephone Encounter (Signed)
Pt states that she was at the ED yesterday for nausea, vomiting, and diarrhea. She stated that she has been feeling sick since a while ago and does not seem to get her resistance back. She is wondering if there is any vitamin injection that she could have that helps with that. Pls call her.

## 2019-12-01 NOTE — Discharge Instructions (Addendum)
Continue medications as previously prescribed.  Return to the emergency department if symptoms significantly worsen or change. 

## 2019-12-01 NOTE — Chronic Care Management (AMB) (Signed)
  Chronic Care Management   Outreach Note  12/01/2019 Name: [name] MRN: [id number] DOB: 11-Jan-1935  Referred by: Glendale Chard, MD Reason for referral : Care Coordination   Sw placed a third unsuccessful outbound call to the patient to assist with care coordination needs. SW left a HIPAA compliant voice message requesting a return call. Upon chart review it is noted the patient was seen in the ED for near syncope. SW collaborated with Consulting civil engineer regarding recent ED visit.  Follow Up Plan: SW will attempt a fourth and final outreach over the next 10 days.  Daneen Schick, BSW, CDP Social Worker, Certified Dementia Practitioner [place] [facility] [place] [facility] [phone]

## 2019-12-01 NOTE — Telephone Encounter (Signed)
The pt has been advised that she need to call her PCP in regards to vitamin injections and also to advise about the lack of energy.

## 2019-12-06 ENCOUNTER — Ambulatory Visit: Payer: Medicare Other

## 2019-12-06 DIAGNOSIS — N183 Chronic kidney disease, stage 3 unspecified: Secondary | ICD-10-CM

## 2019-12-06 DIAGNOSIS — I129 Hypertensive chronic kidney disease with stage 1 through stage 4 chronic kidney disease, or unspecified chronic kidney disease: Secondary | ICD-10-CM

## 2019-12-07 ENCOUNTER — Ambulatory Visit (INDEPENDENT_AMBULATORY_CARE_PROVIDER_SITE_OTHER): Payer: Medicare Other | Admitting: Internal Medicine

## 2019-12-07 ENCOUNTER — Encounter: Payer: Self-pay | Admitting: Internal Medicine

## 2019-12-07 ENCOUNTER — Other Ambulatory Visit: Payer: Self-pay

## 2019-12-07 VITALS — BP 142/70 | HR 86 | Temp 98.1°F | Ht 65.0 in | Wt 145.0 lb

## 2019-12-07 DIAGNOSIS — Z79899 Other long term (current) drug therapy: Secondary | ICD-10-CM

## 2019-12-07 DIAGNOSIS — K580 Irritable bowel syndrome with diarrhea: Secondary | ICD-10-CM

## 2019-12-07 DIAGNOSIS — F419 Anxiety disorder, unspecified: Secondary | ICD-10-CM

## 2019-12-07 NOTE — Patient Instructions (Signed)
Social Worker Visit Information  Goals we discussed today:  Goals Addressed            This Visit's Progress   . COMPLETED: Collaborate with RN Case Manager to perform appropriate assessments to determine care management and care coordination needs       Current Barriers:  Marland Kitchen Knowledge barriers related to the independent self-health management of chronic conditions including HTN and DM II.  Marland Kitchen Ongoing Colitis infection impacting diet and ability to stay hydrated  Social Work Clinical Goal(s):  Marland Kitchen Over the next 60 days the patient will reduce ED visit and IP admissions by attending upcoming Colonoscopy appointment and following treatment recommendations made by her Gastroenterologist . Over the next 90 days the patient will collaborate with the care management team regarding on-going management of DM II and HTN  CCM SW Interventions: Completed 12/06/2019 . Outbound call placed to the patient to assess care coordination needs following ED visit on 11/30/2019  o Patient reports she presented to the ED due to becoming sick 4/27 and experiencing nausea and vomiting for several hours o The patient reports she had accessed at transportation services on 4/27/which she feels was not clean and caused her to catch a virus  . Determined the patient has an office visit scheduled to see her primary provider on 12/07/19 at 3:30 pm  o Patient reports her son [name] has plans to provide transportation to avoid using a ride share program o Discussed the patient plans to make future appointments on her sons days off so he may assist with transportation needs . Collaboration with RN Care Manager regarding patient ED visit and current disposition o Advised the patient to contact embedded care management team as needed to assist with care coordination needs o Next RN Care Manager call scheduled for 12/30/19  Patient Self Care Activities:  . Patient verbalizes understanding of plan to follow up with care management  team post colonoscopy appointment . Self administers medications as prescribed . Attends all scheduled provider appointments . Performs ADL's independently  Please see past updates related to this goal by clicking on the "Past Updates" button in the selected goal          Follow Up Plan: No SW follow up planned at this time. Please contact me with future resource needs.  Daneen Schick, BSW, CDP Social Worker, Certified Dementia Practitioner [place] [facility] [place] [facility] [phone]

## 2019-12-07 NOTE — Chronic Care Management (AMB) (Signed)
Chronic Care Management    Social Work Follow Up Note  12/06/2019 Name: Anna Hoffman MRN: [id number] DOB: Oct 19, 1934  Anna Hoffman is a 85 y.o. year old female who is a primary care patient of Anna Chard, MD. The CCM team was consulted for assistance with care coordination.   Review of patient status, including review of consultants reports, other relevant assessments, and collaboration with appropriate care team members and the patient's provider was performed as part of comprehensive patient evaluation and provision of chronic care management services.    SDOH (Social Determinants of Health) assessments performed: No    Outpatient Encounter Medications as of 12/06/2019  Medication Sig  . acetaminophen (TYLENOL) 325 MG tablet Take 2 tablets (650 mg total) by mouth every 6 (six) hours as needed for mild pain (or Fever >/= 101).  Marland Kitchen albuterol (VENTOLIN HFA) 108 (90 Base) MCG/ACT inhaler Inhale 1-2 puffs into the lungs every 4 (four) hours as needed for wheezing or shortness of breath. (Patient not taking: Reported on 09/05/2019)  . amLODipine (NORVASC) 10 MG tablet TAKE 1 TABLET BY MOUTH ONCE DAILY  . Ascorbic Acid (VITAMIN C) 1000 MG tablet Take 1,000 mg by mouth daily.  Marland Kitchen aspirin EC 81 MG tablet Take 81 mg by mouth daily.  Marland Kitchen azelastine (ASTELIN) 0.1 % nasal spray Place 2 sprays into both nostrils 2 (two) times daily. Use in each nostril as directed (Patient not taking: Reported on 09/05/2019)  . benzonatate (TESSALON PERLES) 100 MG capsule Take 1 capsule (100 mg total) by mouth 3 (three) times daily as needed for cough. (Patient not taking: Reported on 11/30/2019)  . Carboxymethylcellul-Glycerin (LUBRICATING EYE DROPS OP) Apply 1 drop to eye daily as needed (dry eyes).   Marland Kitchen Fexofenadine HCl (ALLEGRA PO) Take 180 mg by mouth at bedtime.   Marland Kitchen glucose blood (ONETOUCH VERIO) test strip 1 each by Other route as needed for other. Use as instructed to check blood sugar 2 times per day dx: e11.65  .  irbesartan (AVAPRO) 150 MG tablet TAKE 1 TABLET BY MOUTH EVERY DAY.  Marland Kitchen Lancets (ONETOUCH ULTRASOFT) lancets Use as instructed to check blood sugar 2 times per day dx code e11.65  . Multiple Vitamins-Minerals (STRESS TAB NF PO) Take 1 tablet by mouth daily.  Marland Kitchen olopatadine (PATANOL) 0.1 % ophthalmic solution Place 1 drop into both eyes 2 (two) times daily. (Patient not taking: Reported on 11/30/2019)  . omeprazole (PRILOSEC) 20 MG capsule Take 20 mg by mouth daily.  . rosuvastatin (CRESTOR) 20 MG tablet TAKE 1 TABLET BY MOUTH ONCE DAILY (Patient taking differently: Take 20 mg by mouth daily. )  . trolamine salicylate (ASPERCREME) 10 % cream Apply 1 application topically as needed for muscle pain.  . vitamin B-12 (CYANOCOBALAMIN) 1000 MCG tablet Take 1,000 mcg by mouth daily.   No facility-administered encounter medications on file as of 12/06/2019.     Goals Addressed            This Visit's Progress   . COMPLETED: Collaborate with RN Case Manager to perform appropriate assessments to determine care management and care coordination needs       Current Barriers:  Marland Kitchen Knowledge barriers related to the independent self-health management of chronic conditions including HTN and DM II.  Marland Kitchen Ongoing Colitis infection impacting diet and ability to stay hydrated  Social Work Clinical Goal(s):  Marland Kitchen Over the next 60 days the patient will reduce ED visit and IP admissions by attending upcoming Colonoscopy appointment and following  treatment recommendations made by her Gastroenterologist . Over the next 90 days the patient will collaborate with the care management team regarding on-going management of DM II and HTN  CCM SW Interventions: Completed 12/06/2019 . Outbound call placed to the patient to assess care coordination needs following ED visit on 11/30/2019  o Patient reports she presented to the ED due to becoming sick 4/27 and experiencing nausea and vomiting for several hours o The patient reports she had  accessed at transportation services on 4/27/which she feels was not clean and caused her to catch a virus  . Determined the patient has an office visit scheduled to see her primary provider on 12/07/19 at 3:30 pm  o Patient reports her son [name] has plans to provide transportation to avoid using a ride share program o Discussed the patient plans to make future appointments on her sons days off so he may assist with transportation needs . Collaboration with RN Care Manager regarding patient ED visit and current disposition o Advised the patient to contact embedded care management team as needed to assist with care coordination needs o Next RN Care Manager call scheduled for 12/30/19  Patient Self Care Activities:  . Patient verbalizes understanding of plan to follow up with care management team post colonoscopy appointment . Self administers medications as prescribed . Attends all scheduled provider appointments . Performs ADL's independently  Please see past updates related to this goal by clicking on the "Past Updates" button in the selected goal          Follow Up Plan: No SW follow up planned at this time. The patient will remain active with RN Care Manager. SW is available to assist with future care coordination needs.   Daneen Schick, BSW, CDP Social Worker, Certified Dementia Practitioner [place] [facility] [place] [facility] [phone]  Total time spent performing care coordination and/or care management activities with the patient by phone or face to face = 18 minutes.

## 2019-12-07 NOTE — Patient Instructions (Signed)
Diet for Irritable Bowel Syndrome  When you have irritable bowel syndrome (IBS), it is very important to eat the foods and follow the eating habits that are best for your condition. IBS may cause various symptoms such as pain in the abdomen, constipation, or diarrhea. Choosing the right foods can help to ease the discomfort from these symptoms. Work with your health care provider and diet and nutrition specialist (dietitian) to find the eating plan that will help to control your symptoms. What are tips for following this plan?      Keep a food diary. This will help you identify foods that cause symptoms. Write down: ? What you eat and when you eat it. ? What symptoms you have. ? When symptoms occur in relation to your meals, such as "pain in abdomen 2 hours after dinner."  Eat your meals slowly and in a relaxed setting.  Aim to eat 5-6 small meals per day. Do not skip meals.  Drink enough fluid to keep your urine pale yellow.  Ask your health care provider if you should take an over-the-counter probiotic to help restore healthy bacteria in your gut (digestive tract). ? Probiotics are foods that contain good bacteria and yeasts.  Your dietitian may have specific dietary recommendations for you based on your symptoms. He or she may recommend that you: ? Avoid foods that cause symptoms. Talk with your dietitian about other ways to get the same nutrients that are in those problem foods. ? Avoid foods with gluten. Gluten is a protein that is found in rye, wheat, and barley. ? Eat more foods that contain soluble fiber. Examples of foods with high soluble fiber include oats, seeds, and certain fruits and vegetables. Take a fiber supplement if directed by your dietitian. ? Reduce or avoid certain foods called FODMAPs. These are foods that contain carbohydrates that are hard to digest. Ask your doctor which foods contain these carbohydrates. What foods are not recommended? The following are some  foods and drinks that may make your symptoms worse:  Fatty foods, such as french fries.  Foods that contain gluten, such as pasta and cereal.  Dairy products, such as milk, cheese, and ice cream.  Chocolate.  Alcohol.  Products with caffeine, such as coffee.  Carbonated drinks, such as soda.  Foods that are high in FODMAPs. These include certain fruits and vegetables.  Products with sweeteners such as honey, high fructose corn syrup, sorbitol, and mannitol. The items listed above may not be a complete list of foods and beverages you should avoid. Contact a dietitian for more information. What foods are good sources of fiber? Your health care provider or dietitian may recommend that you eat more foods that contain fiber. Fiber can help to reduce constipation and other IBS symptoms. Add foods with fiber to your diet a little at a time so your body can get used to them. Too much fiber at one time might cause gas and swelling of your abdomen. The following are some foods that are good sources of fiber:  Berries, such as raspberries, strawberries, and blueberries.  Tomatoes.  Carrots.  Brown rice.  Oats.  Seeds, such as chia and pumpkin seeds. The items listed above may not be a complete list of recommended sources of fiber. Contact your dietitian for more options. Where to find more information  International Foundation for Functional Gastrointestinal Disorders: www.iffgd.org  National Institute of Diabetes and Digestive and Kidney Diseases: www.niddk.nih.gov Summary  When you have irritable bowel syndrome (IBS), it   is very important to eat the foods and follow the eating habits that are best for your condition.  IBS may cause various symptoms such as pain in the abdomen, constipation, or diarrhea.  Choosing the right foods can help to ease the discomfort that comes from symptoms.  Keep a food diary. This will help you identify foods that cause symptoms.  Your health  care provider or diet and nutrition specialist (dietitian) may recommend that you eat more foods that contain fiber. This information is not intended to replace advice given to you by your health care provider. Make sure you discuss any questions you have with your health care provider. Document Revised: 11/10/2018 Document Reviewed: 03/24/2017 Elsevier Patient Education  2020 Elsevier Inc.  

## 2019-12-08 LAB — BMP8+EGFR
BUN/Creatinine Ratio: 20 (ref 12–28)
BUN: 20 mg/dL (ref 8–27)
CO2: 24 mmol/L (ref 20–29)
Calcium: 9.6 mg/dL (ref 8.7–10.3)
Chloride: 102 mmol/L (ref 96–106)
Creatinine, Ser: 1.01 mg/dL — ABNORMAL HIGH (ref 0.57–1.00)
GFR calc Af Amer: 60 mL/min/{1.73_m2} (ref >59)
GFR calc non Af Amer: 52 mL/min/{1.73_m2} — ABNORMAL LOW (ref >59)
Glucose: 85 mg/dL (ref 65–99)
Potassium: 3.6 mmol/L (ref 3.5–5.2)
Sodium: 142 mmol/L (ref 134–144)

## 2019-12-08 LAB — VITAMIN B12: Vitamin B-12: >2000 pg/mL — ABNORMAL HIGH (ref 232–1245)

## 2019-12-09 ENCOUNTER — Other Ambulatory Visit: Payer: Self-pay

## 2019-12-09 DIAGNOSIS — M9903 Segmental and somatic dysfunction of lumbar region: Secondary | ICD-10-CM | POA: Diagnosis not present

## 2019-12-09 DIAGNOSIS — M545 Low back pain: Secondary | ICD-10-CM | POA: Diagnosis not present

## 2019-12-10 ENCOUNTER — Other Ambulatory Visit: Payer: Self-pay | Admitting: Internal Medicine

## 2019-12-10 MED ORDER — BENZONATATE 100 MG PO CAPS: 100.0000 mg | ORAL_CAPSULE | Freq: Three times a day (TID) | ORAL | 0 refills | Status: DC | PRN

## 2019-12-16 ENCOUNTER — Other Ambulatory Visit: Payer: Self-pay | Admitting: Allergy

## 2019-12-16 DIAGNOSIS — M9903 Segmental and somatic dysfunction of lumbar region: Secondary | ICD-10-CM | POA: Diagnosis not present

## 2019-12-16 DIAGNOSIS — M545 Low back pain: Secondary | ICD-10-CM | POA: Diagnosis not present

## 2019-12-19 MED FILL — PAZEO 0.7% EYE DROPS: 0.7 | 25 days supply | Qty: 3 | Fill #0

## 2019-12-20 DIAGNOSIS — H04123 Dry eye syndrome of bilateral lacrimal glands: Secondary | ICD-10-CM | POA: Diagnosis not present

## 2019-12-20 DIAGNOSIS — H10413 Chronic giant papillary conjunctivitis, bilateral: Secondary | ICD-10-CM | POA: Diagnosis not present

## 2019-12-20 DIAGNOSIS — Z961 Presence of intraocular lens: Secondary | ICD-10-CM | POA: Diagnosis not present

## 2019-12-20 DIAGNOSIS — H532 Diplopia: Secondary | ICD-10-CM | POA: Diagnosis not present

## 2019-12-20 DIAGNOSIS — E119 Type 2 diabetes mellitus without complications: Secondary | ICD-10-CM | POA: Diagnosis not present

## 2019-12-20 LAB — HM DIABETES EYE EXAM

## 2019-12-22 ENCOUNTER — Other Ambulatory Visit: Payer: Self-pay | Admitting: Internal Medicine

## 2019-12-22 DIAGNOSIS — M170 Bilateral primary osteoarthritis of knee: Secondary | ICD-10-CM | POA: Diagnosis not present

## 2019-12-23 DIAGNOSIS — M9903 Segmental and somatic dysfunction of lumbar region: Secondary | ICD-10-CM | POA: Diagnosis not present

## 2019-12-23 DIAGNOSIS — M545 Low back pain: Secondary | ICD-10-CM | POA: Diagnosis not present

## 2019-12-24 NOTE — Progress Notes (Signed)
This visit occurred during the SARS-CoV-2 public health emergency.  Safety protocols were in place, including screening questions prior to the visit, additional usage of staff PPE, and extensive cleaning of exam room while observing appropriate contact time as indicated for disinfecting solutions.  Subjective:     Patient ID: Anna Hoffman , female    DOB: June 08, 1935 , 85 y.o.   MRN: [id number]   Chief Complaint  Patient presents with  . ER follow-up    HPI  She is here today for ER f/u. She presented to [facility] ER on 4/28 for further evaluation of intractable n/v. She reports that she was taken to chiropractor on health insurance [place] the day prior to ER presentation. She reports that it was filthy and it smelled terrrible. She reports the smell made her vomit and she kept feeling sick overnight. Due to the persistence of her sx, she went to ER next day. Workup was negative, she was given IV fluids and her sx improved. She has not had any recurrence of her sx since ER discharge.     Past Medical History:  Diagnosis Date  . Arthritis   . Diabetes mellitus without complication ([place])   . Hypertension   . Hypokalemia   . Pneumonia   . Shingles   . Stroke [facility])      Family History  Problem Relation Age of Onset  . Alzheimer's disease Mother   . Prostate cancer Father   . Brain cancer Brother   . Brain cancer Brother   . Pancreatic cancer Sister   . Allergic rhinitis Neg Hx   . Asthma Neg Hx   . Eczema Neg Hx   . Urticaria Neg Hx      Current Outpatient Medications:  .  acetaminophen (TYLENOL) 325 MG tablet, Take 2 tablets (650 mg total) by mouth every 6 (six) hours as needed for mild pain (or Fever >/= 101)., Disp: , Rfl:  .  amLODipine (NORVASC) 10 MG tablet, TAKE 1 TABLET BY MOUTH ONCE DAILY, Disp: 90 tablet, Rfl: 1 .  Ascorbic Acid (VITAMIN C) 1000 MG tablet, Take 1,000 mg by mouth daily., Disp: , Rfl:  .  aspirin EC 81 MG tablet, Take 81 mg by mouth daily., Disp: , Rfl:  .   azelastine (ASTELIN) 0.1 % nasal spray, Place 2 sprays into both nostrils 2 (two) times daily. Use in each nostril as directed, Disp: 30 mL, Rfl: 5 .  Fexofenadine HCl (ALLEGRA PO), Take 180 mg by mouth at bedtime. , Disp: , Rfl:  .  glucose blood (ONETOUCH VERIO) test strip, 1 each by Other route as needed for other. Use as instructed to check blood sugar 2 times per day dx: e11.65, Disp: 100 each, Rfl: 11 .  Lancets (ONETOUCH ULTRASOFT) lancets, Use as instructed to check blood sugar 2 times per day dx code e11.65, Disp: 100 each, Rfl: 12 .  Multiple Vitamins-Minerals (STRESS TAB NF PO), Take 1 tablet by mouth daily., Disp: , Rfl:  .  omeprazole (PRILOSEC) 20 MG capsule, Take 20 mg by mouth daily., Disp: , Rfl:  .  rosuvastatin (CRESTOR) 20 MG tablet, TAKE 1 TABLET BY MOUTH ONCE DAILY (Patient taking differently: Take 20 mg by mouth daily. ), Disp: 90 tablet, Rfl: 1 .  trolamine salicylate (ASPERCREME) 10 % cream, Apply 1 application topically as needed for muscle pain., Disp: , Rfl:  .  vitamin B-12 (CYANOCOBALAMIN) 1000 MCG tablet, Take 1,000 mcg by mouth daily. 2 tablets per day, Disp: ,  Rfl:  .  albuterol (VENTOLIN HFA) 108 (90 Base) MCG/ACT inhaler, Inhale 1-2 puffs into the lungs every 4 (four) hours as needed for wheezing or shortness of breath. (Patient not taking: Reported on 12/07/2019), Disp: 18 g, Rfl: 1 .  benzonatate (TESSALON PERLES) 100 MG capsule, Take 1 capsule (100 mg total) by mouth 3 (three) times daily as needed for cough., Disp: 30 capsule, Rfl: 0 .  Carboxymethylcellul-Glycerin (LUBRICATING EYE DROPS OP), Apply 1 drop to eye daily as needed (dry eyes). , Disp: , Rfl:  .  irbesartan (AVAPRO) 150 MG tablet, TAKE 1 TABLET BY MOUTH EVERY DAY., Disp: 90 tablet, Rfl: 0 .  olopatadine (PATANOL) 0.1 % ophthalmic solution, Place 1 drop into both eyes 2 (two) times daily. (Patient not taking: Reported on 11/30/2019), Disp: 5 mL, Rfl: 5 .  PAZEO 0.7 % SOLN, INSTILL 1 DROP INTO BOTH EYES  DAILY., Disp: 2.5 mL, Rfl: 0   Allergies  Allergen Reactions  . Amoxicillin Diarrhea    Has patient had a PCN reaction causing immediate rash, facial/tongue/throat swelling, SOB or lightheadedness with hypotension: no Has patient had a PCN reaction causing severe rash involving mucus membranes or skin necrosis: no Has patient had a PCN reaction that required hospitalization: no pharmacist consult Has patient had a PCN reaction occurring within the last 10 years: yes If all of the above answers are "NO", then may proceed with Cephalosporin use.   . Ampicillin Rash    - Tolerates Rocephin and Ancef - Remote occurrence; no symptoms of anaphylaxis or severe cutaneous reaction, and no additional medical attention required    . Sulfa Antibiotics Rash     Review of Systems  Constitutional: Negative.   Respiratory: Negative.   Cardiovascular: Negative.   Gastrointestinal: Positive for nausea.  Neurological: Negative.   Psychiatric/Behavioral: Negative.      Today's Vitals   12/07/19 1533  BP: (!) 142/70  Pulse: 86  Temp: 98.1 F (36.7 C)  TempSrc: Oral  SpO2: 98%  Weight: 145 lb (65.8 kg)  Height: _0  (1.651 m)  PainSc: 0-No pain   Body mass index is 24.13 kg/m.   Objective:  Physical Exam Vitals and nursing note reviewed.  Constitutional:      Appearance: Normal appearance.  HENT:     Head: Normocephalic and atraumatic.  Cardiovascular:     Rate and Rhythm: Normal rate and regular rhythm.     Heart sounds: Normal heart sounds.  Pulmonary:     Effort: Pulmonary effort is normal.     Breath sounds: Normal breath sounds.  Abdominal:     General: Abdomen is flat. Bowel sounds are normal.     Palpations: Abdomen is soft.  Skin:    General: Skin is warm.  Neurological:     General: No focal deficit present.     Mental Status: She is alert.  Psychiatric:        Mood and Affect: Mood normal.        Behavior: Behavior normal.         Assessment And Plan:      1. Irritable bowel syndrome with diarrhea  ER evaluation was reviewed in full detail during her visit.  This is a chronic condition, she is reminded to take Viberzi prn diarrhea. This has worked well for her in the past.   2. Anxiety  Chronic, this likely exacerbates her sx. She may benefit from low dose valium prn. I will consider this at a future visit. Unable to  prescribe magnesium supplementation b/c of IBS issues. This may exacerbate her diarrhea sx.   3. Drug therapy  - BMP8+EGFR - Vitamin B12   I personally spent 30 minutes face-to-face and non-face-to-face in the care of this patient, which includes all pre-, intra-, and post visit time on the date of service.   Maximino Greenland, MD    THE PATIENT IS ENCOURAGED TO PRACTICE SOCIAL DISTANCING DUE TO THE COVID-19 PANDEMIC.

## 2019-12-26 ENCOUNTER — Encounter: Payer: Self-pay | Admitting: Internal Medicine

## 2019-12-28 MED FILL — SHINGRIX 50 MCG SUS: 50 | 1 days supply | Qty: 1 | Fill #1

## 2019-12-29 DIAGNOSIS — M170 Bilateral primary osteoarthritis of knee: Secondary | ICD-10-CM | POA: Diagnosis not present

## 2019-12-29 MED FILL — PAZEO 0.7% EYE DROPS: 0.7 | 25 days supply | Qty: 3 | Fill #0

## 2019-12-30 ENCOUNTER — Telehealth: Payer: Medicare Other

## 2020-01-05 DIAGNOSIS — M170 Bilateral primary osteoarthritis of knee: Secondary | ICD-10-CM | POA: Diagnosis not present

## 2020-01-13 DIAGNOSIS — M9903 Segmental and somatic dysfunction of lumbar region: Secondary | ICD-10-CM | POA: Diagnosis not present

## 2020-01-13 DIAGNOSIS — M545 Low back pain: Secondary | ICD-10-CM | POA: Diagnosis not present

## 2020-01-31 ENCOUNTER — Emergency Department (HOSPITAL_COMMUNITY)
Admission: EM | Admit: 2020-01-31 | Discharge: 2020-01-31 | Disposition: A | Discharge status: Home / Self Care | Payer: Medicare Other | Attending: Emergency Medicine | Admitting: Emergency Medicine

## 2020-01-31 ENCOUNTER — Other Ambulatory Visit: Payer: Self-pay

## 2020-01-31 ENCOUNTER — Encounter (HOSPITAL_COMMUNITY): Payer: Self-pay

## 2020-01-31 ENCOUNTER — Emergency Department (HOSPITAL_COMMUNITY): Payer: Medicare Other

## 2020-01-31 DIAGNOSIS — Z880 Allergy status to penicillin: Secondary | ICD-10-CM | POA: Insufficient documentation

## 2020-01-31 DIAGNOSIS — N183 Chronic kidney disease, stage 3 unspecified: Secondary | ICD-10-CM | POA: Diagnosis not present

## 2020-01-31 DIAGNOSIS — N3090 Cystitis, unspecified without hematuria: Secondary | ICD-10-CM | POA: Insufficient documentation

## 2020-01-31 DIAGNOSIS — N3000 Acute cystitis without hematuria: Secondary | ICD-10-CM | POA: Diagnosis not present

## 2020-01-31 DIAGNOSIS — Z20822 Contact with and (suspected) exposure to covid-19: Secondary | ICD-10-CM | POA: Insufficient documentation

## 2020-01-31 DIAGNOSIS — Z7984 Long term (current) use of oral hypoglycemic drugs: Secondary | ICD-10-CM | POA: Diagnosis not present

## 2020-01-31 DIAGNOSIS — R5381 Other malaise: Secondary | ICD-10-CM | POA: Diagnosis present

## 2020-01-31 DIAGNOSIS — E1122 Type 2 diabetes mellitus with diabetic chronic kidney disease: Secondary | ICD-10-CM | POA: Diagnosis not present

## 2020-01-31 DIAGNOSIS — Z79899 Other long term (current) drug therapy: Secondary | ICD-10-CM | POA: Insufficient documentation

## 2020-01-31 DIAGNOSIS — R05 Cough: Secondary | ICD-10-CM | POA: Diagnosis not present

## 2020-01-31 DIAGNOSIS — I129 Hypertensive chronic kidney disease with stage 1 through stage 4 chronic kidney disease, or unspecified chronic kidney disease: Secondary | ICD-10-CM | POA: Diagnosis not present

## 2020-01-31 DIAGNOSIS — E1140 Type 2 diabetes mellitus with diabetic neuropathy, unspecified: Secondary | ICD-10-CM | POA: Diagnosis not present

## 2020-01-31 DIAGNOSIS — R55 Syncope and collapse: Secondary | ICD-10-CM | POA: Diagnosis not present

## 2020-01-31 DIAGNOSIS — I10 Essential (primary) hypertension: Secondary | ICD-10-CM | POA: Diagnosis not present

## 2020-01-31 DIAGNOSIS — R0981 Nasal congestion: Secondary | ICD-10-CM | POA: Insufficient documentation

## 2020-01-31 DIAGNOSIS — R0789 Other chest pain: Secondary | ICD-10-CM | POA: Diagnosis not present

## 2020-01-31 LAB — COMPREHENSIVE METABOLIC PANEL
ALT: 18 U/L (ref 0–44)
AST: 27 U/L (ref 15–41)
Albumin: 4.4 g/dL (ref 3.5–5.0)
Alkaline Phosphatase: 69 U/L (ref 38–126)
Anion gap: 9 (ref 5–15)
BUN: 17 mg/dL (ref 8–23)
CO2: 28 mmol/L (ref 22–32)
Calcium: 8.7 mg/dL — ABNORMAL LOW (ref 8.9–10.3)
Chloride: 104 mmol/L (ref 98–111)
Creatinine, Ser: 1.05 mg/dL — ABNORMAL HIGH (ref 0.44–1.00)
GFR calc Af Amer: 57 mL/min — ABNORMAL LOW (ref >60)
GFR calc non Af Amer: 49 mL/min — ABNORMAL LOW (ref >60)
Glucose, Bld: 96 mg/dL (ref 70–99)
Potassium: 4 mmol/L (ref 3.5–5.1)
Sodium: 141 mmol/L (ref 135–145)
Total Bilirubin: 0.5 mg/dL (ref 0.3–1.2)
Total Protein: 7.3 g/dL (ref 6.5–8.1)

## 2020-01-31 LAB — URINALYSIS, ROUTINE W REFLEX MICROSCOPIC
Bilirubin Urine: NEGATIVE
Glucose, UA: NEGATIVE mg/dL
Hgb urine dipstick: NEGATIVE
Ketones, ur: NEGATIVE mg/dL
Nitrite: NEGATIVE
Protein, ur: 30 mg/dL — AB
Specific Gravity, Urine: 1.016 (ref 1.005–1.030)
pH: 7 (ref 5.0–8.0)

## 2020-01-31 LAB — CBC
HCT: 40.8 % (ref 36.0–46.0)
Hemoglobin: 13 g/dL (ref 12.0–15.0)
MCH: 28.9 pg (ref 26.0–34.0)
MCHC: 31.9 g/dL (ref 30.0–36.0)
MCV: 90.7 fL (ref 80.0–100.0)
Platelets: 379 10*3/uL (ref 150–400)
RBC: 4.5 MIL/uL (ref 3.87–5.11)
RDW: 14.4 % (ref 11.5–15.5)
WBC: 7.9 10*3/uL (ref 4.0–10.5)
nRBC: 0 % (ref 0.0–0.2)

## 2020-01-31 LAB — SARS CORONAVIRUS 2 BY RT PCR (HOSPITAL ORDER, PERFORMED IN ~~LOC~~ HOSPITAL LAB): SARS Coronavirus 2: NEGATIVE

## 2020-01-31 MED ORDER — SODIUM CHLORIDE 0.9 % IV SOLN
1.0000 g | Freq: Once | INTRAVENOUS | Status: AC
  Administered 2020-01-31: 1 g via INTRAVENOUS
  Filled 2020-01-31: qty 10

## 2020-01-31 MED ORDER — CEPHALEXIN 500 MG PO CAPS
500.0000 mg | ORAL_CAPSULE | Freq: Two times a day (BID) | ORAL | 0 refills | Status: DC
Start: 2020-01-31 — End: 2020-06-12

## 2020-01-31 MED FILL — CEPHALEXIN 500 MG CAPSULE: 500 | 7 days supply | Qty: 14 | Fill #0

## 2020-01-31 NOTE — ED Notes (Signed)
Per [employer] urine culture order collected/added on to specimen previously sent. [employer]

## 2020-01-31 NOTE — ED Provider Notes (Signed)
[facility] DEPT Provider Note   CSN: [id number] Arrival date & time: 01/31/20  1149     History Chief Complaint  Patient presents with  . Reaction to shot    Anna Hoffman is a 85 y.o. female.  HPI   Patient presents to the ED for generalized malaise.  Patient states she had a B12 shot last Wednesday.  Shortly after that she started feeling poorly.  She does not think it is related to the shot because she has not had trouble with it in the past.  Patient states she has had some nasal congestion.  She has had a mild earache.  She has been coughing.  Vaginally she has some discomfort in her chest when she coughs.  She has not felt short of breath.  She has not measured any fevers.  She did have 1 day where she had some nausea and loose stools but none in the last several days.  She denies any dysuria.  Patient states she overall feels poorly.  She called her doctor but could not get an appointment till tomorrow so she came to the ED today.  Past Medical History:  Diagnosis Date  . Arthritis   . Diabetes mellitus without complication ([place])   . Hypertension   . Hypokalemia   . Pneumonia   . Shingles   . Stroke [facility])     Patient Active Problem List   Diagnosis Date Noted  . Colitis, acute 08/11/2019  . Colitis 07/11/2019  . Loose stools 06/07/2019  . Herpes zoster without complication 37/05/6268  . Other long term (current) drug therapy 08/12/2018  . Chronic renal disease, stage III 07/19/2018  . Hypertensive nephropathy 07/19/2018  . Community acquired pneumonia of right upper lobe of lung 11/07/2017  . Hypertensive emergency 07/01/2016  . Thalamic hemorrhage ([place]) 07/01/2016  . 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 Benign essential HTN   . Type 2 diabetes mellitus with stage 3 chronic kidney disease, without long-term current use of insulin ([place])   . Mixed hyperlipidemia   . ICH (intracerebral hemorrhage) ([place]) - hypertensive R thalamic  hemorrhage  06/27/2016  . 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 Retroperitoneal bleed 02/08/2016  . Generalized abdominal pain   . Gastroenteritis 02/04/2016  . Diarrhea 02/04/2016  . Hypokalemia 02/04/2016  . Incarcerated paraesophageal hernia 09/02/2014  . Renal mass, left 09/02/2014  . Acute esophagitis 09/02/2014  . Gastric outlet obstruction 09/02/2014  . Hypertension   . Vomiting 09/01/2014    Past Surgical History:  Procedure Laterality Date  . ABDOMINAL HYSTERECTOMY    . BREAST EXCISIONAL BIOPSY Left   . ESOPHAGOGASTRODUODENOSCOPY N/A 09/01/2014   Procedure: ESOPHAGOGASTRODUODENOSCOPY (EGD);  Surgeon: Lear Ng, MD;  Location: [facility];  Service: Endoscopy;  Laterality: N/A;  . HIATAL HERNIA REPAIR N/A 09/04/2014   Procedure: LAPAROSCOPIC REPAIR OF HIATAL HERNIA;  Surgeon: Excell Seltzer, MD;  Location: [facility];  Service: General;  Laterality: N/A;  With MESH  . IR GENERIC HISTORICAL  04/10/2016   IR US GUIDE VASC ACCESS RIGHT 04/10/2016 Corrie Mckusick, DO [facility]  . IR GENERIC HISTORICAL  04/10/2016   IR ANGIOGRAM SELECTIVE EACH ADDITIONAL VESSEL 04/10/2016 Corrie Mckusick, DO [facility]  . IR GENERIC HISTORICAL  04/10/2016   IR ANGIOGRAM SELECTIVE EACH ADDITIONAL VESSEL 04/10/2016 Corrie Mckusick, DO [facility]  . IR GENERIC HISTORICAL  04/10/2016   IR EMBO TUMOR ORGAN ISCHEMIA INFARCT INC GUIDE ROADMAPPING 04/10/2016 Corrie Mckusick, DO [facility]  .  IR GENERIC HISTORICAL  04/10/2016   IR ANGIOGRAM SELECTIVE EACH ADDITIONAL VESSEL 04/10/2016 Corrie Mckusick, DO [facility]  . IR GENERIC HISTORICAL  04/10/2016   IR ANGIOGRAM SELECTIVE EACH ADDITIONAL VESSEL 04/10/2016 Corrie Mckusick, DO [facility]  . IR GENERIC HISTORICAL  04/10/2016   IR RENAL SELECTIVE  UNI INC S&I MOD SED 04/10/2016 Corrie Mckusick, DO [facility]  . IR GENERIC HISTORICAL  03/06/2016   IR RADIOLOGIST EVAL & MGMT 03/06/2016 Corrie Mckusick, DO [facility]  . IR GENERIC HISTORICAL  03/26/2016   IR  RADIOLOGIST EVAL & MGMT 03/26/2016 Corrie Mckusick, DO [facility]  . IR GENERIC HISTORICAL  04/29/2016   IR RADIOLOGIST EVAL & MGMT 04/29/2016 [facility]  . IR RADIOLOGIST EVAL & MGMT  11/12/2016  . IR RADIOLOGIST EVAL & MGMT  12/16/2017  . KNEE SURGERY    . TONSILLECTOMY       OB History   No obstetric history on file.     Family History  Problem Relation Age of Onset  . Alzheimer's disease Mother   . Prostate cancer Father   . Brain cancer Brother   . Brain cancer Brother   . Pancreatic cancer Sister   . Allergic rhinitis Neg Hx   . Asthma Neg Hx   . Eczema Neg Hx   . Urticaria Neg Hx     Social History   Tobacco Use  . Smoking status: Never Smoker  . Smokeless tobacco: Never Used  Vaping Use  . Vaping Use: Never used  Substance Use Topics  . Alcohol use: No  . Drug use: No    Home Medications Prior to Admission medications   Medication Sig Start Date End Date Taking? Authorizing Provider  acetaminophen (TYLENOL) 325 MG tablet Take 2 tablets (650 mg total) by mouth every 6 (six) hours as needed for mild pain (or Fever >/= 101). 02/08/16  Yes Johnson, Clanford L, MD  amLODipine (NORVASC) 10 MG tablet TAKE 1 TABLET BY MOUTH ONCE DAILY Patient taking differently: Take 10 mg by mouth daily.  08/22/19  Yes Glendale Chard, MD  Ascorbic Acid (VITAMIN C) 1000 MG tablet Take 1,000 mg by mouth daily.   Yes [provider]  aspirin EC 81 MG tablet Take 81 mg by mouth daily.   Yes [provider]  azelastine (ASTELIN) 0.1 % nasal spray Place 2 sprays into both nostrils 2 (two) times daily. Use in each nostril as directed Patient taking differently: Place 2 sprays into both nostrils daily as needed for rhinitis.  10/06/18  Yes Padgett, Rae Halsted, MD  Carboxymethylcellul-Glycerin (LUBRICATING EYE DROPS OP) Apply 1 drop to eye daily as needed (dry eyes).    Yes [provider]  Cyanocobalamin (B-12 COMPLIANCE INJECTION) 1000 MCG/ML KIT Inject  1,000 mcg as directed every 30 (thirty) days.   Yes [provider]  Fexofenadine HCl (ALLEGRA PO) Take 180 mg by mouth at bedtime.    Yes [provider]  irbesartan (AVAPRO) 150 MG tablet TAKE 1 TABLET BY MOUTH EVERY DAY. Patient taking differently: Take 150 mg by mouth daily.  12/22/19  Yes Glendale Chard, MD  Multiple Vitamins-Minerals (STRESS TAB NF PO) Take 1 tablet by mouth daily.   Yes [provider]  omeprazole (PRILOSEC) 20 MG capsule Take 20 mg by mouth daily. 11/10/19  Yes [provider]  rosuvastatin (CRESTOR) 20 MG tablet TAKE 1 TABLET BY MOUTH ONCE DAILY Patient taking differently: Take 20 mg by mouth daily.  08/22/19  Yes Glendale Chard, MD  trolamine salicylate (ASPERCREME) 10 % cream Apply 1 application topically as needed for muscle pain.   Yes [provider]  albuterol (VENTOLIN HFA) 108 (90 Base) MCG/ACT inhaler Inhale 1-2 puffs into the lungs every 4 (four) hours as needed for wheezing or shortness of breath. Patient not taking: Reported on 12/07/2019 06/15/19   Kennith Gain, MD  benzonatate (TESSALON PERLES) 100 MG capsule Take 1 capsule (100 mg total) by mouth 3 (three) times daily as needed for cough. Patient not taking: Reported on 01/31/2020 12/10/19   Glendale Chard, MD  cephALEXin (KEFLEX) 500 MG capsule Take 1 capsule (500 mg total) by mouth 2 (two) times daily. 01/31/20   Dorie Rank, MD  glucose blood [facility] VERIO) test strip 1 each by Other route as needed for other. Use as instructed to check blood sugar 2 times per day dx: e11.65 10/19/19   Glendale Chard, MD  Lancets [facility] ULTRASOFT) lancets Use as instructed to check blood sugar 2 times per day dx code e11.65 10/19/19   Glendale Chard, MD  olopatadine (PATANOL) 0.1 % ophthalmic solution Place 1 drop into both eyes 2 (two) times daily. Patient not taking: Reported on 11/30/2019 06/15/19   Kennith Gain, MD  PAZEO 0.7 % SOLN INSTILL 1 DROP INTO BOTH  EYES DAILY. Patient not taking: Reported on 01/31/2020 12/19/19   Kennith Gain, MD    Allergies    Amoxicillin, Ampicillin, and Sulfa antibiotics  Review of Systems   Review of Systems  All other systems reviewed and are negative.   Physical Exam Updated Vital Signs BP (!) 159/91   Pulse 89   Temp 98.9 F (37.2 C) (Oral)   Resp 16   LMP  (LMP Unknown)   SpO2 98%   Physical Exam Vitals and nursing note reviewed.  Constitutional:      General: She is not in acute distress.    Appearance: She is well-developed.  HENT:     Head: Normocephalic and atraumatic.     Right Ear: External ear normal.     Left Ear: External ear normal.  Eyes:     General: No scleral icterus.       Right eye: No discharge.        Left eye: No discharge.     Conjunctiva/sclera: Conjunctivae normal.  Neck:     Trachea: No tracheal deviation.  Cardiovascular:     Rate and Rhythm: Normal rate and regular rhythm.  Pulmonary:     Effort: Pulmonary effort is normal. No respiratory distress.     Breath sounds: Normal breath sounds. No stridor. No wheezing or rales.  Abdominal:     General: Bowel sounds are normal. There is no distension.     Palpations: Abdomen is soft.     Tenderness: There is no abdominal tenderness. There is no guarding or rebound.  Musculoskeletal:        General: No tenderness.     Cervical back: Neck supple.  Skin:    General: Skin is warm and dry.     Findings: No rash.  Neurological:     Mental Status: She is alert.     Cranial Nerves: No cranial nerve deficit (no facial droop, extraocular movements intact, no slurred speech).     Sensory: No sensory deficit.     Motor: No abnormal muscle tone or seizure activity.     Coordination: Coordination normal.     ED Results / Procedures / Treatments  Labs (all labs ordered are listed, but only abnormal results are displayed) Labs Reviewed  COMPREHENSIVE METABOLIC PANEL - Abnormal; Notable for the following  components:      Result Value   Creatinine, Ser 1.05 (*)    Calcium 8.7 (*)    GFR calc non Af Amer 49 (*)    GFR calc Af Amer 57 (*)    All other components within normal limits  URINALYSIS, ROUTINE W REFLEX MICROSCOPIC - Abnormal; Notable for the following components:   Color, Urine AMBER (*)    Protein, ur 30 (*)    Leukocytes,Ua LARGE (*)    Bacteria, UA FEW (*)    All other components within normal limits  SARS CORONAVIRUS 2 BY RT PCR (HOSPITAL ORDER, [facility])  URINE CULTURE  CBC    EKG EKG Interpretation  Date/Time:  Tuesday January 31 2020 13:43:54 EDT Ventricular Rate:  88 PR Interval:    QRS Duration: 82 QT Interval:  389 QTC Calculation: 471 R Axis:   21 Text Interpretation: sinus rhythm with prolonged  PR interval Posterior infarct, old No significant change since last tracing Confirmed by Dorie Rank [phone]) on 01/31/2020 1:53:52 PM   Radiology DG Chest 2 View  Result Date: 01/31/2020 CLINICAL DATA:  Cough and syncope EXAM: CHEST - 2 VIEW COMPARISON:  November 30, 2019. FINDINGS: No edema or consolidation evident. Heart size and pulmonary vascularity are normal. No adenopathy. There is a hiatal hernia. No bone lesions. IMPRESSION: No edema or airspace opacity. Heart size normal. There is a hiatal hernia. Electronically Signed   By: Lowella Grip III M.D.   On: 01/31/2020 15:35    Procedures Procedures (including critical care time)  Medications Ordered in ED Medications  cefTRIAXone (ROCEPHIN) 1 g in sodium chloride 0.9 % 100 mL IVPB (1 g Intravenous New Bag/Given 01/31/20 1611)    ED Course  I have reviewed the triage vital signs and the nursing notes.  Pertinent labs & imaging results that were available during my care of the patient were reviewed by me and considered in my medical decision making (see chart for details).  Clinical Course as of Jan 31 1615  Tue Jan 31, 2020  1614 Covid test is negative.  CBC metabolic panel  normal.  Urinalysis does suggest infection.   [UT]  6546 Chest x-ray without pneumonia.  Suspect symptoms related to UTI.  Patient is nontoxic   [JK]    Clinical Course User Index [JK] Dorie Rank, MD   MDM Rules/Calculators/A&P                          Patient presented to ED with complaints of general malaise.  Patient's laboratory evaluation suggests UTI.  No signs of sepsis.  No pneumonia.  Exam is otherwise reassuring.  Patient was given a dose of IV Rocephin.  Will discharge home with a course of antibiotic.  Patient has an amoxicillin allergy however she has tolerated cephalosporins in the past. Final Clinical Impression(s) / ED Diagnoses Final diagnoses:  Acute cystitis without hematuria    Rx / DC Orders ED Discharge Orders         Ordered    cephALEXin (KEFLEX) 500 MG capsule  2 times daily     Discontinue  Reprint     01/31/20 1615           Dorie Rank, MD 01/31/20 1617

## 2020-01-31 NOTE — Discharge Instructions (Addendum)
Take the antibiotics as prescribed.  Follow-up with your doctor to make sure the infection resolves.  Return for fever or other worsening symptoms.

## 2020-01-31 NOTE — ED Notes (Signed)
Patient unable to provide urine specimen

## 2020-01-31 NOTE — ED Triage Notes (Signed)
Pt presents with c/o possible reaction to her b-12 shot, last dose was last Wednesday. Pt reports she has not been feeling good since the shot.

## 2020-02-01 ENCOUNTER — Ambulatory Visit: Payer: Self-pay

## 2020-02-01 ENCOUNTER — Ambulatory Visit: Payer: Medicare Other | Admitting: Internal Medicine

## 2020-02-01 NOTE — Chronic Care Management (AMB) (Signed)
°  Care Management    Consult Note  02/01/2020 Name: Anna Hoffman MRN: [id number] DOB: [birth year]  Care management team received notification of patient's recent emergency department visit related to UTI. Marland KitchenBased on review of health record, Anna Hoffman is currently active in the embedded care coordination program.    Review of patient status, including review of consultants reports, relevant laboratory and other test results, and collaboration with appropriate care team members and the patient's provider was performed as part of comprehensive patient evaluation and provision of chronic care management services.    SW placed an outbound call to the patient to follow up on care coordination needs following recent ED visit. The patient reports she is feeling better today than yesterday and has picked up newly prescribed antibiotic. The patient was prescribed Cephalexin and reports taking 1 dose at time of today's call. The patient reports worry over whether this medication is appropriate due to past allergies to certain antibiotics including Sulfa. SW advised the patient that due to SW being non-clinical she would defer medication questions to [facility].  Plan: SW collaboration with RN Care Manager who reports she will contact the patient.  Daneen Schick, BSW, CDP Social Worker, Certified Dementia Practitioner [facility] [place] [facility] [phone]

## 2020-02-02 ENCOUNTER — Ambulatory Visit: Payer: Self-pay

## 2020-02-02 ENCOUNTER — Other Ambulatory Visit: Payer: Self-pay

## 2020-02-02 ENCOUNTER — Telehealth: Payer: Medicare Other

## 2020-02-02 DIAGNOSIS — E1122 Type 2 diabetes mellitus with diabetic chronic kidney disease: Secondary | ICD-10-CM

## 2020-02-02 DIAGNOSIS — I129 Hypertensive chronic kidney disease with stage 1 through stage 4 chronic kidney disease, or unspecified chronic kidney disease: Secondary | ICD-10-CM

## 2020-02-02 DIAGNOSIS — N183 Chronic kidney disease, stage 3 unspecified: Secondary | ICD-10-CM

## 2020-02-02 DIAGNOSIS — I5189 Other ill-defined heart diseases: Secondary | ICD-10-CM

## 2020-02-02 NOTE — Chronic Care Management (AMB) (Signed)
  Chronic Care Management   Outreach Note  02/02/2020 Name: Anna Hoffman MRN: [id number] DOB: 1935/01/28  Referred by: Glendale Chard, MD Reason for referral : Chronic Care Management (FU RN CM post Ed f/u )   An unsuccessful telephone outreach was attempted today. The patient was referred to the case management team for assistance with care management and care coordination.   Follow Up Plan: Telephone follow up appointment with care management team member scheduled for: 02/13/20  Barb Merino, RN, BSN, CCM Care Management Coordinator [place] [facility]  Direct Phone: [phone]

## 2020-02-03 LAB — URINE CULTURE: Culture: >=100000 — AB

## 2020-02-04 NOTE — Progress Notes (Signed)
ED Antimicrobial Stewardship Positive Culture Follow Up   Anna Hoffman is an 85 y.o. female who presented to [facility] on 01/31/2020 with a chief complaint of  Chief Complaint  Patient presents with  . Reaction to shot  CC: "general malaise;" doesn't feel symptoms related to shot  Recent Results (from the past 720 hour(s))  SARS Coronavirus 2 by RT PCR (hospital order, performed in [facility] hospital lab) Nasopharyngeal Nasopharyngeal Swab     Status: None   Collection Time: 01/31/20  1:58 PM   Specimen: Nasopharyngeal Swab  Result Value Ref Range Status   SARS Coronavirus 2 NEGATIVE NEGATIVE Final    Comment: (NOTE) SARS-CoV-2 target nucleic acids are NOT DETECTED.  The SARS-CoV-2 RNA is generally detectable in upper and lower respiratory specimens during the acute phase of infection. The lowest concentration of SARS-CoV-2 viral copies this assay can detect is 250 copies / mL. A negative result does not preclude SARS-CoV-2 infection and should not be used as the sole basis for treatment or other patient management decisions.  A negative result may occur with improper specimen collection / handling, submission of specimen other than nasopharyngeal swab, presence of viral mutation(s) within the areas targeted by this assay, and inadequate number of viral copies (<250 copies / mL). A negative result must be combined with clinical observations, patient history, and epidemiological information.  Fact Sheet for Patients:   StrictlyIdeas.no  Fact Sheet for Healthcare Providers: BankingDealers.co.za  This test is not yet approved or  cleared by the [place] FDA and has been authorized for detection and/or diagnosis of SARS-CoV-2 by FDA under an Emergency Use Authorization (EUA).  This EUA will remain in effect (meaning this test can be used) for the duration of the COVID-19 declaration under Section 564(b)(1) of the Act, 21  U.S.C. section 360bbb-3(b)(1), unless the authorization is terminated or revoked sooner.  Performed at [facility], [place] [address], [place], [place] [address]   Urine Culture     Status: Abnormal   Collection Time: 01/31/20  2:25 PM   Specimen: Urine, Clean Catch  Result Value Ref Range Status   Specimen Description   Final    URINE, CLEAN CATCH Performed at [facility], [place] [address] [place], [place] [address]    Special Requests   Final    NONE Performed at [facility], [place] [address], [place],  [address]    Culture >=100,000 COLONIES/mL STAPHYLOCOCCUS EPIDERMIDIS (A)  Final   Report Status 02/03/2020 FINAL  Final   Organism ID, Bacteria STAPHYLOCOCCUS EPIDERMIDIS (A)  Final      Susceptibility   Staphylococcus epidermidis - MIC*    CIPROFLOXACIN <=0.5 SENSITIVE Sensitive     GENTAMICIN <=0.5 SENSITIVE Sensitive     NITROFURANTOIN <=16 SENSITIVE Sensitive     OXACILLIN >=4 RESISTANT Resistant     TETRACYCLINE <=1 SENSITIVE Sensitive     VANCOMYCIN 1 SENSITIVE Sensitive     TRIMETH/SULFA <=10 SENSITIVE Sensitive     CLINDAMYCIN <=0.25 SENSITIVE Sensitive     RIFAMPIN <=0.5 SENSITIVE Sensitive     Inducible Clindamycin NEGATIVE Sensitive     * >=100,000 COLONIES/mL STAPHYLOCOCCUS EPIDERMIDIS    [x]  Treated with Keflex, organism resistant to prescribed antimicrobial  Contact patient to check for symptoms - if still with malaise or new urinary symptoms, stop Keflex and start Macrobid 100 mg PO bid x 5 days (#10)  ED Provider: Delia Heady, PA-C   Adelbert Gaspard A 02/04/2020, 1:06  PM Clinical Pharmacist [phone]

## 2020-02-05 ENCOUNTER — Telehealth: Payer: Self-pay | Admitting: Emergency Medicine

## 2020-02-05 NOTE — Telephone Encounter (Signed)
Post ED Visit - Positive Culture Follow-up: Unsuccessful Patient Follow-up  Culture assessed and recommendations reviewed by:  []  Elenor Quinones, Pharm.D. []  Heide Guile, Pharm.D., BCPS AQ-ID []  Parks Neptune, Pharm.D., BCPS []  Alycia Rossetti, Pharm.D., BCPS []  [place], Pharm.D., BCPS, AAHIVP []  Legrand Como, Pharm.D., BCPS, AAHIVP [x]  Reuel Boom, PharmD []  Vincenza Hews, PharmD, BCPS  Positive urine culture  []  Patient discharged without antimicrobial prescription and treatment is now indicated [x]  Organism is resistant to prescribed ED discharge antimicrobial []  Patient with positive blood cultures   Unable to contact patient by phone number on file, letter will be sent to address on file  Milus Mallick 02/05/2020, 6:25 PM

## 2020-02-06 ENCOUNTER — Telehealth (HOSPITAL_BASED_OUTPATIENT_CLINIC_OR_DEPARTMENT_OTHER): Payer: Self-pay

## 2020-02-06 NOTE — Telephone Encounter (Signed)
02/05/20 @ 2102 Pt called after receiving call.  Pts only complaint is diarrhea which she already has prescription medication for that and is working and some headache relieved by rest.  Rx for Macrobid not called.  Pt advised if urinary symptoms return @ later date follow up with PCP.

## 2020-02-07 ENCOUNTER — Other Ambulatory Visit: Payer: Self-pay | Admitting: Internal Medicine

## 2020-02-07 MED FILL — OMEPRAZOLE DR 20 MG CAPSULE: 20 | 90 days supply | Qty: 90 | Fill #0

## 2020-02-09 ENCOUNTER — Other Ambulatory Visit: Payer: Self-pay

## 2020-02-09 ENCOUNTER — Ambulatory Visit (INDEPENDENT_AMBULATORY_CARE_PROVIDER_SITE_OTHER): Payer: Medicare Other | Admitting: Internal Medicine

## 2020-02-09 ENCOUNTER — Encounter: Payer: Self-pay | Admitting: Internal Medicine

## 2020-02-09 ENCOUNTER — Ambulatory Visit (INDEPENDENT_AMBULATORY_CARE_PROVIDER_SITE_OTHER): Payer: Medicare Other

## 2020-02-09 VITALS — BP 146/80 | HR 94 | Temp 98.3°F | Ht 65.0 in | Wt 146.0 lb

## 2020-02-09 DIAGNOSIS — E1122 Type 2 diabetes mellitus with diabetic chronic kidney disease: Secondary | ICD-10-CM | POA: Diagnosis not present

## 2020-02-09 DIAGNOSIS — I129 Hypertensive chronic kidney disease with stage 1 through stage 4 chronic kidney disease, or unspecified chronic kidney disease: Secondary | ICD-10-CM

## 2020-02-09 DIAGNOSIS — Z23 Encounter for immunization: Secondary | ICD-10-CM | POA: Diagnosis not present

## 2020-02-09 DIAGNOSIS — R5383 Other fatigue: Secondary | ICD-10-CM

## 2020-02-09 DIAGNOSIS — R2689 Other abnormalities of gait and mobility: Secondary | ICD-10-CM | POA: Diagnosis not present

## 2020-02-09 DIAGNOSIS — Z0000 Encounter for general adult medical examination without abnormal findings: Secondary | ICD-10-CM | POA: Diagnosis not present

## 2020-02-09 DIAGNOSIS — N183 Chronic kidney disease, stage 3 unspecified: Secondary | ICD-10-CM | POA: Diagnosis not present

## 2020-02-09 LAB — POCT URINALYSIS DIPSTICK
Bilirubin, UA: NEGATIVE
Blood, UA: NEGATIVE
Glucose, UA: NEGATIVE
Ketones, UA: NEGATIVE
Leukocytes, UA: NEGATIVE
Nitrite, UA: NEGATIVE
Protein, UA: NEGATIVE
Spec Grav, UA: 1.02 (ref 1.010–1.025)
Urobilinogen, UA: 0.2 E.U./dL
pH, UA: 6.5 (ref 5.0–8.0)

## 2020-02-09 MED ORDER — BOOSTRIX 5-2.5-18.5 LF-MCG/0.5 IM SUSP
0.5000 mL | Freq: Once | INTRAMUSCULAR | 0 refills | Status: AC
Start: 2020-02-09 — End: 2020-02-09

## 2020-02-09 NOTE — Patient Instructions (Signed)

## 2020-02-09 NOTE — Patient Instructions (Signed)
[name] , Thank you for taking time to come for your Medicare Wellness Visit. I appreciate your ongoing commitment to your health goals. Please review the following plan we discussed and let me know if I can assist you in the future.   Screening recommendations/referrals: Colonoscopy: completed 09/05/2019, due 09/04/2022 Mammogram: completed 06/14/2019, due 06/13/2020 Bone Density: completed 06/11/2016 Recommended yearly ophthalmology/optometry visit for glaucoma screening and checkup Recommended yearly dental visit for hygiene and checkup  Vaccinations: Influenza vaccine: completed 04/12/2019, due 03/04/2020 Pneumococcal vaccine: completed 12/05/2011 Tdap vaccine: sent to pharmacy Shingles vaccine: discussed   Covid-19:10/31/2019, 10/06/2019  Advanced directives: Please bring a copy of your POA (Power of [place]) and/or Living Will to your next appointment.   Conditions/risks identified: none  Next appointment:04/18/2020 at 2:30  Follow up in one year for your annual wellness visit    Preventive Care 65 Years and Older, Female Preventive care refers to lifestyle choices and visits with your health care provider that can promote health and wellness. What does preventive care include?  A yearly physical exam. This is also called an annual well check.  Dental exams once or twice a year.  Routine eye exams. Ask your health care provider how often you should have your eyes checked.  Personal lifestyle choices, including:  Daily care of your teeth and gums.  Regular physical activity.  Eating a healthy diet.  Avoiding tobacco and drug use.  Limiting alcohol use.  Practicing safe sex.  Taking low-dose aspirin every day.  Taking vitamin and mineral supplements as recommended by your health care provider. What happens during an annual well check? The services and screenings done by your health care provider during your annual well check will depend on your age, overall health,  lifestyle risk factors, and family history of disease. Counseling  Your health care provider may ask you questions about your:  Alcohol use.  Tobacco use.  Drug use.  Emotional well-being.  Home and relationship well-being.  Sexual activity.  Eating habits.  History of falls.  Memory and ability to understand (cognition).  Work and work Statistician.  Reproductive health. Screening  You may have the following tests or measurements:  Height, weight, and BMI.  Blood pressure.  Lipid and cholesterol levels. These may be checked every 5 years, or more frequently if you are over 75 years old.  Skin check.  Lung cancer screening. You may have this screening every year starting at age 41 if you have a 30-pack-year history of smoking and currently smoke or have quit within the past 15 years.  Fecal occult blood test (FOBT) of the stool. You may have this test every year starting at age 45.  Flexible sigmoidoscopy or colonoscopy. You may have a sigmoidoscopy every 5 years or a colonoscopy every 10 years starting at age 61.  Hepatitis C blood test.  Hepatitis B blood test.  Sexually transmitted disease (STD) testing.  Diabetes screening. This is done by checking your blood sugar (glucose) after you have not eaten for a while (fasting). You may have this done every 1-3 years.  Bone density scan. This is done to screen for osteoporosis. You may have this done starting at age 54.  Mammogram. This may be done every 1-2 years. Talk to your health care provider about how often you should have regular mammograms. Talk with your health care provider about your test results, treatment options, and if necessary, the need for more tests. Vaccines  Your health care provider may recommend certain vaccines,  such as:  Influenza vaccine. This is recommended every year.  Tetanus, diphtheria, and acellular pertussis (Tdap, Td) vaccine. You may need a Td booster every 10 years.  Zoster  vaccine. You may need this after age 29.  Pneumococcal 13-valent conjugate (PCV13) vaccine. One dose is recommended after age 77.  Pneumococcal polysaccharide (PPSV23) vaccine. One dose is recommended after age 20. Talk to your health care provider about which screenings and vaccines you need and how often you need them. This information is not intended to replace advice given to you by your health care provider. Make sure you discuss any questions you have with your health care provider. Document Released: 08/17/2015 Document Revised: 04/09/2016 Document Reviewed: 05/22/2015 Elsevier Interactive Patient Education  2017 [place] Prevention in the Home Falls can cause injuries. They can happen to people of all ages. There are many things you can do to make your home safe and to help prevent falls. What can I do on the outside of my home?  Regularly fix the edges of walkways and driveways and fix any cracks.  Remove anything that might make you trip as you walk through a door, such as a raised step or threshold.  Trim any bushes or trees on the path to your home.  Use bright outdoor lighting.  Clear any walking paths of anything that might make someone trip, such as rocks or tools.  Regularly check to see if handrails are loose or broken. Make sure that both sides of any steps have handrails.  Any raised decks and porches should have guardrails on the edges.  Have any leaves, snow, or ice cleared regularly.  Use sand or salt on walking paths during winter.  Clean up any spills in your garage right away. This includes oil or grease spills. What can I do in the bathroom?  Use night lights.  Install grab bars by the toilet and in the tub and shower. Do not use towel bars as grab bars.  Use non-skid mats or decals in the tub or shower.  If you need to sit down in the shower, use a plastic, non-slip stool.  Keep the floor dry. Clean up any water that spills on the  floor as soon as it happens.  Remove soap buildup in the tub or shower regularly.  Attach bath mats securely with double-sided non-slip rug tape.  Do not have throw rugs and other things on the floor that can make you trip. What can I do in the bedroom?  Use night lights.  Make sure that you have a light by your bed that is easy to reach.  Do not use any sheets or blankets that are too big for your bed. They should not hang down onto the floor.  Have a firm chair that has side arms. You can use this for support while you get dressed.  Do not have throw rugs and other things on the floor that can make you trip. What can I do in the kitchen?  Clean up any spills right away.  Avoid walking on wet floors.  Keep items that you use a lot in easy-to-reach places.  If you need to reach something above you, use a strong step stool that has a grab bar.  Keep electrical cords out of the way.  Do not use floor polish or wax that makes floors slippery. If you must use wax, use non-skid floor wax.  Do not have throw rugs and other things on  the floor that can make you trip. What can I do with my stairs?  Do not leave any items on the stairs.  Make sure that there are handrails on both sides of the stairs and use them. Fix handrails that are broken or loose. Make sure that handrails are as long as the stairways.  Check any carpeting to make sure that it is firmly attached to the stairs. Fix any carpet that is loose or worn.  Avoid having throw rugs at the top or bottom of the stairs. If you do have throw rugs, attach them to the floor with carpet tape.  Make sure that you have a light switch at the top of the stairs and the bottom of the stairs. If you do not have them, ask someone to add them for you. What else can I do to help prevent falls?  Wear shoes that:  Do not have high heels.  Have rubber bottoms.  Are comfortable and fit you well.  Are closed at the toe. Do not wear  sandals.  If you use a stepladder:  Make sure that it is fully opened. Do not climb a closed stepladder.  Make sure that both sides of the stepladder are locked into place.  Ask someone to hold it for you, if possible.  Clearly mark and make sure that you can see:  Any grab bars or handrails.  First and last steps.  Where the edge of each step is.  Use tools that help you move around (mobility aids) if they are needed. These include:  Canes.  Walkers.  Scooters.  Crutches.  Turn on the lights when you go into a dark area. Replace any light bulbs as soon as they burn out.  Set up your furniture so you have a clear path. Avoid moving your furniture around.  If any of your floors are uneven, fix them.  If there are any pets around you, be aware of where they are.  Review your medicines with your doctor. Some medicines can make you feel dizzy. This can increase your chance of falling. Ask your doctor what other things that you can do to help prevent falls. This information is not intended to replace advice given to you by your health care provider. Make sure you discuss any questions you have with your health care provider. Document Released: 05/17/2009 Document Revised: 12/27/2015 Document Reviewed: 08/25/2014 Elsevier Interactive Patient Education  2017 [employer].

## 2020-02-09 NOTE — Progress Notes (Signed)
This visit occurred during the SARS-CoV-2 public health emergency.  Safety protocols were in place, including screening questions prior to the visit, additional usage of staff PPE, and extensive cleaning of exam room while observing appropriate contact time as indicated for disinfecting solutions.  Subjective:   Anna Hoffman is a 85 y.o. female who presents for Medicare Annual (Subsequent) preventive examination.  Review of Systems     Cardiac Risk Factors include: advanced age (>24mn, >>85women);diabetes mellitus;dyslipidemia;hypertension     Objective:    Today's Vitals   02/09/20 1432 02/09/20 1440  BP: (!) 146/80   Pulse: 94   Temp: 98.3 F (36.8 C)   TempSrc: Oral   Weight: 146 lb (66.2 kg)   Height: 5' 5" (1.651 m)   PainSc:  5    Body mass index is 24.3 kg/m.  Advanced Directives 02/09/2020 01/31/2020 11/30/2019 08/12/2019 08/11/2019 07/16/2019 07/11/2019  Does Patient Have a Medical Advance Directive? Yes Yes Yes No No No No  Type of [place] will Living will - - - - -  Does patient want to make changes to medical advance directive? - - Yes (ED - Information included in AVS) - - - -  Copy of [place] Chart? No - copy requested - - - - - -  Would patient like information on creating a medical advance directive? - - - Yes (ED - Information included in AVS) Yes (ED - Information included in AVS) - No - Patient declined    Current Medications (verified) Outpatient Encounter Medications as of 02/09/2020  Medication Sig  . acetaminophen (TYLENOL) 325 MG tablet Take 2 tablets (650 mg total) by mouth every 6 (six) hours as needed for mild pain (or Fever >/= 101).  .Marland KitchenamLODipine (NORVASC) 10 MG tablet TAKE 1 TABLET BY MOUTH ONCE DAILY (Patient taking differently: Take 10 mg by mouth daily. )  . Ascorbic Acid (VITAMIN C) 1000 MG tablet Take 1,000 mg by mouth daily.  .Marland Kitchenaspirin EC 81 MG tablet Take 81 mg by mouth daily.   .Marland Kitchenazelastine (ASTELIN) 0.1 % nasal spray Place 2 sprays into both nostrils 2 (two) times daily. Use in each nostril as directed (Patient taking differently: Place 2 sprays into both nostrils daily as needed for rhinitis. )  . benzonatate (TESSALON PERLES) 100 MG capsule Take 1 capsule (100 mg total) by mouth 3 (three) times daily as needed for cough.  . Carboxymethylcellul-Glycerin (LUBRICATING EYE DROPS OP) Apply 1 drop to eye daily as needed (dry eyes).   . cephALEXin (KEFLEX) 500 MG capsule Take 1 capsule (500 mg total) by mouth 2 (two) times daily.  . Cyanocobalamin (B-12 COMPLIANCE INJECTION) 1000 MCG/ML KIT Inject 1,000 mcg as directed every 30 (thirty) days.  .Marland KitchenFexofenadine HCl (ALLEGRA PO) Take 180 mg by mouth at bedtime.   .Marland Kitchenglucose blood (ONETOUCH VERIO) test strip 1 each by Other route as needed for other. Use as instructed to check blood sugar 2 times per day dx: e11.65  . irbesartan (AVAPRO) 150 MG tablet TAKE 1 TABLET BY MOUTH EVERY DAY. (Patient taking differently: Take 150 mg by mouth daily. )  . Lancets (ONETOUCH ULTRASOFT) lancets Use as instructed to check blood sugar 2 times per day dx code e11.65  . Multiple Vitamins-Minerals (STRESS TAB NF PO) Take 1 tablet by mouth daily.  .Marland Kitchenolopatadine (PATANOL) 0.1 % ophthalmic solution Place 1 drop into both eyes 2 (two) times daily.  .Marland Kitchenomeprazole (PRILOSEC)  20 MG capsule TAKE 1 CAPSULE BY MOUTH ONCE DAILY 30 MINUTES TO 1 HOUR BEFORE A MEAL  . PAZEO 0.7 % SOLN INSTILL 1 DROP INTO BOTH EYES DAILY. (Patient not taking: Reported on 01/31/2020)  . rosuvastatin (CRESTOR) 20 MG tablet TAKE 1 TABLET BY MOUTH ONCE DAILY (Patient taking differently: Take 20 mg by mouth daily. )  . Tdap (BOOSTRIX) 5-2.5-18.5 LF-MCG/0.5 injection Inject 0.5 mLs into the muscle once for 1 dose.  . trolamine salicylate (ASPERCREME) 10 % cream Apply 1 application topically as needed for muscle pain.   No facility-administered encounter medications on file as of  02/09/2020.    Allergies (verified) Amoxicillin, Ampicillin, and Sulfa antibiotics   History: Past Medical History:  Diagnosis Date  . Arthritis   . Diabetes mellitus without complication ([place])   . Hypertension   . Hypokalemia   . Pneumonia   . Shingles   . Stroke [facility])    Past Surgical History:  Procedure Laterality Date  . ABDOMINAL HYSTERECTOMY    . BREAST EXCISIONAL BIOPSY Left   . ESOPHAGOGASTRODUODENOSCOPY N/A 09/01/2014   Procedure: ESOPHAGOGASTRODUODENOSCOPY (EGD);  Surgeon: Lear Ng, MD;  Location: [facility];  Service: Endoscopy;  Laterality: N/A;  . HIATAL HERNIA REPAIR N/A 09/04/2014   Procedure: LAPAROSCOPIC REPAIR OF HIATAL HERNIA;  Surgeon: Excell Seltzer, MD;  Location: [facility];  Service: General;  Laterality: N/A;  With MESH  . IR GENERIC HISTORICAL  04/10/2016   IR US GUIDE VASC ACCESS RIGHT 04/10/2016 Corrie Mckusick, DO [facility]  . IR GENERIC HISTORICAL  04/10/2016   IR ANGIOGRAM SELECTIVE EACH ADDITIONAL VESSEL 04/10/2016 Corrie Mckusick, DO [facility]  . IR GENERIC HISTORICAL  04/10/2016   IR ANGIOGRAM SELECTIVE EACH ADDITIONAL VESSEL 04/10/2016 Corrie Mckusick, DO [facility]  . IR GENERIC HISTORICAL  04/10/2016   IR EMBO TUMOR ORGAN ISCHEMIA INFARCT INC GUIDE ROADMAPPING 04/10/2016 Corrie Mckusick, DO [facility]  . IR GENERIC HISTORICAL  04/10/2016   IR ANGIOGRAM SELECTIVE EACH ADDITIONAL VESSEL 04/10/2016 Corrie Mckusick, DO [facility]  . IR GENERIC HISTORICAL  04/10/2016   IR ANGIOGRAM SELECTIVE EACH ADDITIONAL VESSEL 04/10/2016 Corrie Mckusick, DO [facility]  . IR GENERIC HISTORICAL  04/10/2016   IR RENAL SELECTIVE  UNI INC S&I MOD SED 04/10/2016 Corrie Mckusick, DO [facility]  . IR GENERIC HISTORICAL  03/06/2016   IR RADIOLOGIST EVAL & MGMT 03/06/2016 Corrie Mckusick, DO [facility]  . IR GENERIC HISTORICAL  03/26/2016   IR RADIOLOGIST EVAL & MGMT 03/26/2016 Corrie Mckusick, DO [facility]  . IR GENERIC HISTORICAL  04/29/2016   IR RADIOLOGIST EVAL & MGMT  04/29/2016 [facility]  . IR RADIOLOGIST EVAL & MGMT  11/12/2016  . IR RADIOLOGIST EVAL & MGMT  12/16/2017  . KNEE SURGERY    . TONSILLECTOMY     Family History  Problem Relation Age of Onset  . Alzheimer's disease Mother   . Prostate cancer Father   . Brain cancer Brother   . Brain cancer Brother   . Pancreatic cancer Sister   . Allergic rhinitis Neg Hx   . Asthma Neg Hx   . Eczema Neg Hx   . Urticaria Neg Hx    Social History   Socioeconomic History  . Marital status: Widowed    Spouse name: Not on file  . Number of children: 2  . Years of education: Not on file  . Highest education level: Not on file  Occupational History  . Occupation: retired  Tobacco  Use  . Smoking status: Never Smoker  . Smokeless tobacco: Never Used  Vaping Use  . Vaping Use: Never used  Substance and Sexual Activity  . Alcohol use: No  . Drug use: No  . Sexual activity: Not Currently    Birth control/protection: Surgical  Other Topics Concern  . Not on file  Social History Narrative   Son [name] lives with her   Social Determinants of Health   Financial Resource Strain: Low Risk   . Difficulty of Paying Living Expenses: Not hard at all  Food Insecurity: No Food Insecurity  . Worried About Charity fundraiser in the Last Year: Never true  . Ran Out of Food in the Last Year: Never true  Transportation Needs: No Transportation Needs  . Lack of Transportation (Medical): No  . Lack of Transportation (Non-Medical): No  Physical Activity: Insufficiently Active  . Days of Exercise per Week: 7 days  . Minutes of Exercise per Session: 10 min  Stress: No Stress Concern Present  . Feeling of Stress : Not at all  Social Connections:   . Frequency of Communication with Friends and Family:   . Frequency of Social Gatherings with Friends and Family:   . Attends Religious Services:   . Active Member of Clubs or Organizations:   . Attends Archivist Meetings:   Marland Kitchen Marital Status:      Tobacco Counseling Counseling given: Not Answered   Clinical Intake:  Pre-visit preparation completed: Yes  Pain : 0-10 Pain Score: 5  Pain Type: Chronic pain Pain Location: Knee Pain Orientation: Right, Left Pain Descriptors / Indicators: Sharp Pain Onset: More than a month ago Pain Frequency: Intermittent Pain Relieving Factors: aspercreme helps  Pain Relieving Factors: aspercreme helps  Nutritional Status: BMI of 19-24  Normal Nutritional Risks: None Diabetes: Yes  How often do you need to have someone help you when you read instructions, pamphlets, or other written materials from your doctor or pharmacy?: 1 - Never What is the last grade level you completed in school?: 12th grade  Diabetic? Yes Nutrition Risk Assessment:  Has the patient had any N/V/D within the last 2 months?  No  Does the patient have any non-healing wounds?  No  Has the patient had any unintentional weight loss or weight gain?  No   Diabetes:  Is the patient diabetic?  Yes  If diabetic, was a CBG obtained today?  No  Did the patient bring in their glucometer from home?  No  How often do you monitor your CBG's? daily.   Financial Strains and Diabetes Management:  Are you having any financial strains with the device, your supplies or your medication? No .  Does the patient want to be seen by Chronic Care Management for management of their diabetes?  No  Would the patient like to be referred to a Nutritionist or for Diabetic Management?  No   Diabetic Exams:  Diabetic Eye Exam: Completed 12/20/2019 Diabetic Foot Exam: Completed 04/06/2019   Interpreter Needed?: No  Information entered by :: NAllen LPN   Activities of Daily Living In your present state of health, do you have any difficulty performing the following activities: 02/09/2020 08/12/2019  Hearing? Y N  Comment decreased hearing in right ear -  Vision? N N  Comment - -  Difficulty concentrating or making decisions? N N   Walking or climbing stairs? N N  Comment - -  Dressing or bathing? N N  Doing errands, shopping? N  N  Preparing Food and eating ? N -  Using the Toilet? N -  In the past six months, have you accidently leaked urine? Y -  Comment wears a pad -  Do you have problems with loss of bowel control? N -  Managing your Medications? N -  Managing your Finances? N -  Housekeeping or managing your Housekeeping? N -  Some recent data might be hidden    Patient Care Team: Glendale Chard, MD as PCP - General (Internal Medicine) Daneen Schick as Social Worker [place], Claudette Stapler, RN as Case Manager  Indicate any recent Medical Services you may have received from other than [facility] providers in the past year (date may be approximate).     Assessment:   This is a routine wellness examination for Gardi.  Hearing/Vision screen  Hearing Screening   125Hz 250Hz 500Hz 1000Hz 2000Hz 3000Hz 4000Hz 6000Hz 8000Hz  Right ear:           Left ear:           Vision Screening Comments: Regular eye exams, Dr. Katy Fitch  Dietary issues and exercise activities discussed: Current Exercise Habits: Home exercise routine, Type of exercise: Other - see comments (rides stationary bike), Time (Minutes): 15, Frequency (Times/Week): 7, Weekly Exercise (Minutes/Week): 105  Goals    .  "to eat better and keep my blood sugars good" (pt-stated)      Current Barriers:  Marland Kitchen Knowledge Deficits related to diabetes disease process and Self Health management  . Chronic Disease Management support and education needs related to DM, HTN, Colitis  Nurse Case Manager Clinical Goal(s):  Marland Kitchen Over the next 90 days, patient will work with the [facility] and PCP to address needs related to diabetes disease education and support  Interventions:  . Evaluation of current treatment plan related to Diabetes Mellitus and patient's adherence to plan as established by provider. . Provided education to patient re: recent A1C of 6.7; discussed target A1C  and how to best achieve/maintain this goal including medication and diet adherence and implementing exercise with recommendations of 150 min per week as tolerated and as directed by PCP . Reviewed medications with patient and discussed patient is not prescribed to take a RX at this time and is controlling her DM through diet and exercise on her stationary bike . Discussed plans with patient for ongoing care management follow up and provided patient with direct contact information for care management team . Provided patient with printed educational materials related to Diabetes Management with Meal Planning  . Advised patient, providing education and rationale, to check cbg daily before meals and record, calling the CCM RN CM and or PCP for findings outside established parameters.    Patient Self Care Activities:  . Patient verbalizes understanding of plan to review printed patient DM education materials to be reviewed at next [facility] RNCM follow up call   . Self administers medications as prescribed . Attends all scheduled provider appointments . Calls pharmacy for medication refills . Performs ADL's independently . Performs IADL's independently . Calls provider office for new concerns or questions  Initial goal documentation     .  High Risk for readmission      Current Barriers:  Marland Kitchen Knowledge Deficits related to disease process and Self health management of Colitis . Chronic Disease Management support and education needs related to Colitis, DM, HTN  Nurse Case Manager Clinical Goal(s):  Marland Kitchen Over the next 45 days, patient will work with the [facility]  to address needs related to evaluation and treatment of Colitis . Over the next 90 days, patient will not experience hospital admission. Hospital Admissions in last 6 months = 3  Interventions:  . Evaluation of current treatment plan related to Infectious Colitis and patient's adherence to plan as established by provider. . Discussed  plans with patient for ongoing care management follow up and provided patient with direct contact information for care management team . Determined Ms. Dilworth is scheduled for a Colonoscopy on Monday, February 1st; discussed she was given the laxative and instructions for the prep to begin on Saturday prior to the procedure . Determined since Ms. Docter's last IP admission, she has remained to be asymptomatic and feels she is progressing toward wellness . Discussed patient has a clear understanding to keep a close eye on her CBG while undergoing the Colon prep to help prevent hypoglycemia  Patient Self Care Activities:  . Self administers medications as prescribed . Attends all scheduled provider appointments . Calls pharmacy for medication refills . Performs ADL's independently . Performs IADL's independently . Calls provider office for new concerns or questions  Initial goal documentation     .  Patient Stated      04/05/2019, wants to get well and get out of the house    .  Patient Stated      02/09/2020, wants to get better with balance      Depression Screen PHQ 2/9 Scores 02/09/2020 04/19/2019 04/06/2019 03/24/2019 02/16/2019 02/07/2019 09/02/2018  PHQ - 2 Score 0 0 0 0 0 0 0  PHQ- 9 Score 3 - 3 - - - -    Fall Risk Fall Risk  02/09/2020 05/26/2019 04/06/2019 03/24/2019 03/24/2019  Falls in the past year? 1 0 0 0 0  Comment loss balance looking in refrigerator - - - -  Number falls in past yr: 0 - - - -  Injury with Fall? 0 - - - -  Comment - - - - -  Risk for fall due to : History of fall(s);Impaired balance/gait;Medication side effect - Impaired balance/gait - -  Risk for fall due to: Comment - - - - -  Follow up Falls evaluation completed;Education provided;Falls prevention discussed - Falls evaluation completed;Education provided;Falls prevention discussed - -    Any stairs in or around the home? Yes  If so, are there any without handrails? Yes  Home free of loose throw rugs in walkways,  pet beds, electrical cords, etc? Yes  Adequate lighting in your home to reduce risk of falls? Yes   ASSISTIVE DEVICES UTILIZED TO PREVENT FALLS:  Life alert? No  Use of a cane, walker or w/c? Yes  Grab bars in the bathroom? No  Shower chair or bench in shower? Yes  Elevated toilet seat or a handicapped toilet? No   TIMED UP AND GO:  Was the test performed? No .   Gait slow and steady with assistive device  Cognitive Function:     6CIT Screen 02/09/2020 04/06/2019  What Year? 0 points 0 points  What month? 0 points 0 points  What time? 0 points 0 points  Count back from 20 2 points 0 points  Months in reverse 0 points 2 points  Repeat phrase 4 points 0 points  Total Score 6 2    Immunizations Immunization History  Administered Date(s) Administered  . Influenza, High Dose Seasonal PF 04/12/2019  . Influenza-Unspecified 06/29/2018  . PFIZER SARS-COV-2 Vaccination 10/06/2019, 10/31/2019  . Zoster Recombinat (Shingrix)  09/01/2019, 12/29/2019    TDAP status: Due, Education has been provided regarding the importance of this vaccine. Advised may receive this vaccine at local pharmacy or Health Dept. Aware to provide a copy of the vaccination record if obtained from local pharmacy or Health Dept. Verbalized acceptance and understanding. Flu Vaccine status: Up to date Pneumococcal vaccine status: Up to date Covid-19 vaccine status: Completed vaccines  Qualifies for Shingles Vaccine? Yes   Zostavax completed No   Shingrix Completed?: No.    Education has been provided regarding the importance of this vaccine. Patient has been advised to call insurance company to determine out of pocket expense if they have not yet received this vaccine. Advised may also receive vaccine at local pharmacy or Health Dept. Verbalized acceptance and understanding.  Screening Tests Health Maintenance  Topic Date Due  . TETANUS/TDAP  Never done  . PNA vac Low Risk Adult (2 of 2 - PPSV23) 12/04/2012  .  INFLUENZA VACCINE  03/04/2020  . FOOT EXAM  04/05/2020  . HEMOGLOBIN A1C  05/11/2020  . OPHTHALMOLOGY EXAM  12/19/2020  . DEXA SCAN  Completed  . COVID-19 Vaccine  Completed    Health Maintenance  Health Maintenance Due  Topic Date Due  . TETANUS/TDAP  Never done  . PNA vac Low Risk Adult (2 of 2 - PPSV23) 12/04/2012    Colorectal cancer screening: Completed 09/05/2019 . Repeat every 3 years Mammogram status: Completed 06/14/2019. Repeat every year Bone Density status: Completed 06/11/2016.   Lung Cancer Screening: (Low Dose CT Chest recommended if Age 70-80 years, 30 pack-year currently smoking OR have quit w/in 15years.) does not qualify.   Lung Cancer Screening Referral: no  Additional Screening:  Hepatitis C Screening: does not qualify;  Vision Screening: Recommended annual ophthalmology exams for early detection of glaucoma and other disorders of the eye. Is the patient up to date with their annual eye exam?  Yes  Who is the provider or what is the name of the office in which the patient attends annual eye exams? Dr. Katy Fitch If pt is not established with a provider, would they like to be referred to a provider to establish care? No .   Dental Screening: Recommended annual dental exams for proper oral hygiene  Community Resource Referral / Chronic Care Management: CRR required this visit?  No   CCM required this visit?  No      Plan:     I have personally reviewed and noted the following in the patient's chart:   . Medical and social history . Use of alcohol, tobacco or illicit drugs  . Current medications and supplements . Functional ability and status . Nutritional status . Physical activity . Advanced directives . List of other physicians . Hospitalizations, surgeries, and ER visits in previous 12 months . Vitals . Screenings to include cognitive, depression, and falls . Referrals and appointments  In addition, I have reviewed and discussed with patient  certain preventive protocols, quality metrics, and best practice recommendations. A written personalized care plan for preventive services as well as general preventive health recommendations were provided to patient.     Kellie Simmering, LPN   02/04/1286   Nurse Notes:

## 2020-02-10 LAB — BMP8+EGFR
BUN/Creatinine Ratio: 18 (ref 12–28)
BUN: 24 mg/dL (ref 8–27)
CO2: 21 mmol/L (ref 20–29)
Calcium: 9.9 mg/dL (ref 8.7–10.3)
Chloride: 102 mmol/L (ref 96–106)
Creatinine, Ser: 1.33 mg/dL — ABNORMAL HIGH (ref 0.57–1.00)
GFR calc Af Amer: 43 mL/min/{1.73_m2} — ABNORMAL LOW (ref >59)
GFR calc non Af Amer: 37 mL/min/{1.73_m2} — ABNORMAL LOW (ref >59)
Glucose: 96 mg/dL (ref 65–99)
Potassium: 4.8 mmol/L (ref 3.5–5.2)
Sodium: 140 mmol/L (ref 134–144)

## 2020-02-10 LAB — HEMOGLOBIN A1C
Est. average glucose Bld gHb Est-mCnc: 128 mg/dL
Hgb A1c MFr Bld: 6.1 % — ABNORMAL HIGH (ref 4.8–5.6)

## 2020-02-13 ENCOUNTER — Telehealth: Payer: Medicare Other

## 2020-02-13 ENCOUNTER — Other Ambulatory Visit: Payer: Self-pay

## 2020-02-13 ENCOUNTER — Ambulatory Visit (INDEPENDENT_AMBULATORY_CARE_PROVIDER_SITE_OTHER): Payer: Medicare Other

## 2020-02-13 DIAGNOSIS — N183 Chronic kidney disease, stage 3 unspecified: Secondary | ICD-10-CM | POA: Diagnosis not present

## 2020-02-13 DIAGNOSIS — E1122 Type 2 diabetes mellitus with diabetic chronic kidney disease: Secondary | ICD-10-CM

## 2020-02-13 DIAGNOSIS — I5189 Other ill-defined heart diseases: Secondary | ICD-10-CM

## 2020-02-13 DIAGNOSIS — I129 Hypertensive chronic kidney disease with stage 1 through stage 4 chronic kidney disease, or unspecified chronic kidney disease: Secondary | ICD-10-CM

## 2020-02-15 NOTE — Chronic Care Management (AMB) (Signed)
Chronic Care Management   Follow Up Note   02/14/2020 Name: Anna Hoffman MRN: [id number] DOB: 1935-05-02  Referred by: Glendale Chard, MD Reason for referral : Chronic Care Management (FU RN CM Call )   Anna Hoffman is a 85 y.o. year old female who is a primary care patient of Glendale Chard, MD. The CCM team was consulted for assistance with chronic disease management and care coordination needs.    Review of patient status, including review of consultants reports, relevant laboratory and other test results, and collaboration with appropriate care team members and the patient's provider was performed as part of comprehensive patient evaluation and provision of chronic care management services.    SDOH (Social Determinants of Health) assessments performed: Yes - no acute challenges See Care Plan activities for detailed interventions related to [place])   Placed CCM RN CM outbound follow up call to patient to assess for CCM needs.     Outpatient Encounter Medications as of 02/13/2020  Medication Sig  . acetaminophen (TYLENOL) 325 MG tablet Take 2 tablets (650 mg total) by mouth every 6 (six) hours as needed for mild pain (or Fever >/= 101).  Marland Kitchen amLODipine (NORVASC) 10 MG tablet TAKE 1 TABLET BY MOUTH ONCE DAILY (Patient taking differently: Take 10 mg by mouth daily. )  . Ascorbic Acid (VITAMIN C) 1000 MG tablet Take 1,000 mg by mouth daily.  Marland Kitchen aspirin EC 81 MG tablet Take 81 mg by mouth daily.  Marland Kitchen azelastine (ASTELIN) 0.1 % nasal spray Place 2 sprays into both nostrils 2 (two) times daily. Use in each nostril as directed (Patient taking differently: Place 2 sprays into both nostrils daily as needed for rhinitis. )  . benzonatate (TESSALON PERLES) 100 MG capsule Take 1 capsule (100 mg total) by mouth 3 (three) times daily as needed for cough.  . Carboxymethylcellul-Glycerin (LUBRICATING EYE DROPS OP) Apply 1 drop to eye daily as needed (dry eyes).   . cephALEXin (KEFLEX) 500 MG capsule  Take 1 capsule (500 mg total) by mouth 2 (two) times daily.  . Cyanocobalamin (B-12 COMPLIANCE INJECTION) 1000 MCG/ML KIT Inject 1,000 mcg as directed every 30 (thirty) days.  Marland Kitchen Fexofenadine HCl (ALLEGRA PO) Take 180 mg by mouth at bedtime.   Marland Kitchen glucose blood (ONETOUCH VERIO) test strip 1 each by Other route as needed for other. Use as instructed to check blood sugar 2 times per day dx: e11.65  . irbesartan (AVAPRO) 150 MG tablet TAKE 1 TABLET BY MOUTH EVERY DAY. (Patient taking differently: Take 150 mg by mouth daily. )  . Lancets (ONETOUCH ULTRASOFT) lancets Use as instructed to check blood sugar 2 times per day dx code e11.65  . Multiple Vitamins-Minerals (STRESS TAB NF PO) Take 1 tablet by mouth daily.  Marland Kitchen olopatadine (PATANOL) 0.1 % ophthalmic solution Place 1 drop into both eyes 2 (two) times daily.  Marland Kitchen omeprazole (PRILOSEC) 20 MG capsule TAKE 1 CAPSULE BY MOUTH ONCE DAILY 30 MINUTES TO 1 HOUR BEFORE A MEAL  . PAZEO 0.7 % SOLN INSTILL 1 DROP INTO BOTH EYES DAILY. (Patient not taking: Reported on 01/31/2020)  . rosuvastatin (CRESTOR) 20 MG tablet TAKE 1 TABLET BY MOUTH ONCE DAILY (Patient taking differently: Take 20 mg by mouth daily. )  . trolamine salicylate (ASPERCREME) 10 % cream Apply 1 application topically as needed for muscle pain.   No facility-administered encounter medications on file as of 02/13/2020.     Objective:  Lab Results  Component Value Date   HGBA1C  6.1 (H) 02/09/2020   HGBA1C 6.3 (H) 11/10/2019   HGBA1C 6.7 (H) 07/11/2019   Lab Results  Component Value Date   LDLCALC 124 (H) 06/29/2016   CREATININE 1.33 (H) 02/09/2020   BP Readings from Last 3 Encounters:  02/09/20 (!) 146/80  02/09/20 (!) 146/80  01/31/20 (!) 155/81    Goals Addressed      Patient Stated   .  "to eat better and keep my blood sugars good" (pt-stated)        Current Barriers:  Marland Kitchen Knowledge Deficits related to diabetes disease process and Self Health management  . Chronic Disease Management  support and education needs related to DM, HTN, Colitis  Nurse Case Manager Clinical Goal(s):  Marland Kitchen Over the next 90 days, patient will work with the [place] and PCP to address needs related to diabetes disease education and support  CCM RN CM Interventions:  02/14/20 call completed with patient  . Evaluation of current treatment plan related to Diabetes Mellitus and patient's adherence to plan as established by provider. . Provided education to patient re:  A1C is down to 6.1; discussed target A1C <5.6 and how to best achieve/maintain this goal including medication and diet adherence and implementing exercise with recommendations of 150 min per week as tolerated and as directed by PCP . Reviewed medications with patient and discussed patient is not prescribed to take a RX at this time and is controlling her DM through diet and exercise on her stationary bike . Provided patient with printed educational materials related to Diabetes Care Schedule; How to Avoid Complications while living with Diabetes; Grocery Shopping with Diabetes . Advised patient, providing education and rationale, to check cbg daily before meals and record, calling the CCM RN CM and or PCP for findings outside established parameters  . Discussed plans with patient for ongoing care management follow up and provided patient with direct contact information for care management team  Patient Self Care Activities:  . Patient verbalizes understanding of plan to review printed patient DM education materials to be reviewed at next [facility] RNCM follow up call   . Self administers medications as prescribed . Attends all scheduled provider appointments . Calls pharmacy for medication refills . Performs ADL's independently . Performs IADL's independently . Calls provider office for new concerns or questions  Please see past updates related to this goal by clicking on the "Past Updates" button in the selected goal        Other   .  High  Risk for readmission        Current Barriers:  Marland Kitchen Knowledge Deficits related to disease process and Self health management of Colitis . Chronic Disease Management support and education needs related to Colitis, DM, HTN  Nurse Case Manager Clinical Goal(s):  Marland Kitchen Over the next 45 days, patient will work with the Gastroenterologist to address needs related to evaluation and treatment of Colitis - Goal Met  . Over the next 90 days, patient will not experience hospital admission. Hospital Admissions in last 6 months = 3 Goal Not Met  . New 02/14/20 Over the next 90 days, patient will not experience hospital admissions and or ED visits. Hospital Admissions/ED visits in last 6 months = 3, ED visits = 2  CCM RN CM Interventions:  02/14/20 call completed with patient  . Evaluation of current treatment plan related to Infectious Colitis and Cystitis and patient's adherence to plan as established by provider . Determined patient underwent a Colonoscopy procedure on  09/05/19 and was noted to have 7 benign polyps removed from various parts of her intestine . Determined since Anna Hoffman's last ED visit for symptoms suggestive of Colitis and Diverticulitis, she has remained to be asymptomatic and feels she is progressing toward wellness, she denies persistent GI s/s since having her Colonoscopy . Discussed patient is taking a probiotic and avoiding eating foods she feels may trigger Colitis and or Diverticulitis . Discussed most recent ED visit on 01/31/20 indicated patient had a UTI for which she was prescribed an antibiotic  . Determined patient has since f/u with PCP Dr. Glendale Chard, her urinalysis was negative for infection, patient states she has 4 doses of Cephalexin to take in which she plans to do per instructions of Dr. Baird Cancer . Educated on early s/s of UTI and when to call the MD, potential cause for UTI and how to maintain good urinary health . Discussed plans with patient for ongoing care management  follow up and provided patient with direct contact information for care management team  Patient Self Care Activities:  . Self administers medications as prescribed . Attends all scheduled provider appointments . Calls pharmacy for medication refills . Performs ADL's independently . Performs IADL's independently . Calls provider office for new concerns or questions  Please see past updates related to this goal by clicking on the "Past Updates" button in the selected goal        Plan:   Telephone follow up appointment with care management team member scheduled for: 03/28/20  Barb Merino, RN, BSN, CCM Care Management Coordinator [place] [facility]  Direct Phone: [phone]

## 2020-02-15 NOTE — Patient Instructions (Signed)
Visit Information  Goals Addressed      Patient Stated   .  "to eat better and keep my blood sugars good" (pt-stated)        Current Barriers:  Marland Kitchen Knowledge Deficits related to diabetes disease process and Self Health management  . Chronic Disease Management support and education needs related to DM, HTN, Colitis  Nurse Case Manager Clinical Goal(s):  Marland Kitchen Over the next 90 days, patient will work with the [place] and PCP to address needs related to diabetes disease education and support  CCM RN CM Interventions:  02/14/20 call completed with patient  . Evaluation of current treatment plan related to Diabetes Mellitus and patient's adherence to plan as established by provider. . Provided education to patient re:  A1C is down to 6.1; discussed target A1C <5.6 and how to best achieve/maintain this goal including medication and diet adherence and implementing exercise with recommendations of 150 min per week as tolerated and as directed by PCP . Reviewed medications with patient and discussed patient is not prescribed to take a RX at this time and is controlling her DM through diet and exercise on her stationary bike . Provided patient with printed educational materials related to Diabetes Care Schedule; How to Avoid Complications while living with Diabetes; Grocery Shopping with Diabetes . Advised patient, providing education and rationale, to check cbg daily before meals and record, calling the CCM RN CM and or PCP for findings outside established parameters  . Discussed plans with patient for ongoing care management follow up and provided patient with direct contact information for care management team  Patient Self Care Activities:  . Patient verbalizes understanding of plan to review printed patient DM education materials to be reviewed at next [facility] RNCM follow up call   . Self administers medications as prescribed . Attends all scheduled provider appointments . Calls pharmacy for medication  refills . Performs ADL's independently . Performs IADL's independently . Calls provider office for new concerns or questions  Please see past updates related to this goal by clicking on the "Past Updates" button in the selected goal        Other   .  High Risk for readmission        Current Barriers:  Marland Kitchen Knowledge Deficits related to disease process and Self health management of Colitis . Chronic Disease Management support and education needs related to Colitis, DM, HTN  Nurse Case Manager Clinical Goal(s):  Marland Kitchen Over the next 45 days, patient will work with the Gastroenterologist to address needs related to evaluation and treatment of Colitis - Goal Met  . Over the next 90 days, patient will not experience hospital admission. Hospital Admissions in last 6 months = 3 Goal Not Met  . New 02/14/20 Over the next 90 days, patient will not experience hospital admissions and or ED visits. Hospital Admissions/ED visits in last 6 months = 3, ED visits = 2  CCM RN CM Interventions:  02/14/20 call completed with patient  . Evaluation of current treatment plan related to Infectious Colitis and Cystitis and patient's adherence to plan as established by provider . Determined patient underwent a Colonoscopy procedure on 09/05/19 and was noted to have 7 benign polyps removed from various parts of her intestine . Determined since Ms. Sotero's last ED visit for symptoms suggestive of Colitis and Diverticulitis, she has remained to be asymptomatic and feels she is progressing toward wellness, she denies persistent GI s/s since having her Colonoscopy . Discussed patient  is taking a probiotic and avoiding eating foods she feels may trigger Colitis and or Diverticulitis . Discussed most recent ED visit on 01/31/20 indicated patient had a UTI for which she was prescribed an antibiotic  . Determined patient has since f/u with PCP Dr. Glendale Chard, her urinalysis was negative for infection, patient states she has 4 doses  of Cephalexin to take in which she plans to do per instructions of Dr. Baird Cancer . Educated on early s/s of UTI and when to call the MD, potential cause for UTI and how to maintain good urinary health . Discussed plans with patient for ongoing care management follow up and provided patient with direct contact information for care management team  Patient Self Care Activities:  . Self administers medications as prescribed . Attends all scheduled provider appointments . Calls pharmacy for medication refills . Performs ADL's independently . Performs IADL's independently . Calls provider office for new concerns or questions  Please see past updates related to this goal by clicking on the "Past Updates" button in the selected goal        Patient verbalizes understanding of instructions provided today.   Telephone follow up appointment with care management team member scheduled for: 03/28/20  Barb Merino, RN, BSN, CCM Care Management Coordinator [place] [facility]  Direct Phone: [phone]

## 2020-02-20 ENCOUNTER — Other Ambulatory Visit: Payer: Self-pay | Admitting: Internal Medicine

## 2020-02-20 MED FILL — AMLODIPINE BESYLATE 10 MG T: 10 | 90 days supply | Qty: 90 | Fill #0

## 2020-02-20 MED FILL — ROSUVASTATIN CALCIUM 20 MG: 20 | 90 days supply | Qty: 90 | Fill #0

## 2020-02-21 DIAGNOSIS — M170 Bilateral primary osteoarthritis of knee: Secondary | ICD-10-CM | POA: Diagnosis not present

## 2020-02-26 NOTE — Progress Notes (Signed)
This visit occurred during the SARS-CoV-2 public health emergency.  Safety protocols were in place, including screening questions prior to the visit, additional usage of staff PPE, and extensive cleaning of exam room while observing appropriate contact time as indicated for disinfecting solutions.  Subjective:     Patient ID: Anna Hoffman , female    DOB: [birth year] , 85 y.o.   MRN: [id number]   Chief Complaint  Patient presents with  . Diabetes    HPI  She is here today for DM/BP check. She reports compliance with meds. She c/o fatigue. She does not feel that she can get her energy back. Reports she has been having issues ever since she had shingles.  Also doesn't feel she has gotten her strength back. Doesn't always feel stable when walking.   Diabetes She presents for her follow-up diabetic visit. She has type 2 diabetes mellitus. Her disease course has been stable. There are no hypoglycemic associated symptoms. Pertinent negatives for hypoglycemia include no headaches. Associated symptoms include fatigue. Pertinent negatives for diabetes include no blurred vision and no chest pain. There are no hypoglycemic complications. Risk factors for coronary artery disease include diabetes mellitus, dyslipidemia, hypertension, stress and post-menopausal. She is compliant with treatment most of the time. Her weight is stable. She is following a diabetic diet. She participates in exercise intermittently. Her breakfast blood glucose is taken between 7-8 am. Her breakfast blood glucose range is generally 90-110 mg/dl. An ACE inhibitor/angiotensin II receptor blocker is being taken. Eye exam is current.  Hypertension This is a chronic problem. The current episode started more than 1 year ago. The problem has been gradually improving since onset. The problem is controlled. Pertinent negatives include no blurred vision, chest pain, headaches or shortness of breath.     Past Medical History:  Diagnosis  Date  . Arthritis   . Diabetes mellitus without complication ([place])   . Hypertension   . Hypokalemia   . Pneumonia   . Shingles   . Stroke [facility])      Family History  Problem Relation Age of Onset  . Alzheimer's disease Mother   . Prostate cancer Father   . Brain cancer Brother   . Brain cancer Brother   . Pancreatic cancer Sister   . Allergic rhinitis Neg Hx   . Asthma Neg Hx   . Eczema Neg Hx   . Urticaria Neg Hx      Current Outpatient Medications:  .  acetaminophen (TYLENOL) 325 MG tablet, Take 2 tablets (650 mg total) by mouth every 6 (six) hours as needed for mild pain (or Fever >/= 101)., Disp: , Rfl:  .  Ascorbic Acid (VITAMIN C) 1000 MG tablet, Take 1,000 mg by mouth daily., Disp: , Rfl:  .  aspirin EC 81 MG tablet, Take 81 mg by mouth daily., Disp: , Rfl:  .  azelastine (ASTELIN) 0.1 % nasal spray, Place 2 sprays into both nostrils 2 (two) times daily. Use in each nostril as directed (Patient taking differently: Place 2 sprays into both nostrils daily as needed for rhinitis. ), Disp: 30 mL, Rfl: 5 .  benzonatate (TESSALON PERLES) 100 MG capsule, Take 1 capsule (100 mg total) by mouth 3 (three) times daily as needed for cough., Disp: 30 capsule, Rfl: 0 .  Carboxymethylcellul-Glycerin (LUBRICATING EYE DROPS OP), Apply 1 drop to eye daily as needed (dry eyes). , Disp: , Rfl:  .  cephALEXin (KEFLEX) 500 MG capsule, Take 1 capsule (500 mg total) by mouth  2 (two) times daily., Disp: 14 capsule, Rfl: 0 .  Cyanocobalamin (B-12 COMPLIANCE INJECTION) 1000 MCG/ML KIT, Inject 1,000 mcg as directed every 30 (thirty) days., Disp: , Rfl:  .  Fexofenadine HCl (ALLEGRA PO), Take 180 mg by mouth at bedtime. , Disp: , Rfl:  .  glucose blood (ONETOUCH VERIO) test strip, 1 each by Other route as needed for other. Use as instructed to check blood sugar 2 times per day dx: e11.65, Disp: 100 each, Rfl: 11 .  irbesartan (AVAPRO) 150 MG tablet, TAKE 1 TABLET BY MOUTH EVERY DAY. (Patient taking  differently: Take 150 mg by mouth daily. ), Disp: 90 tablet, Rfl: 0 .  Lancets (ONETOUCH ULTRASOFT) lancets, Use as instructed to check blood sugar 2 times per day dx code e11.65, Disp: 100 each, Rfl: 12 .  Multiple Vitamins-Minerals (STRESS TAB NF PO), Take 1 tablet by mouth daily., Disp: , Rfl:  .  olopatadine (PATANOL) 0.1 % ophthalmic solution, Place 1 drop into both eyes 2 (two) times daily., Disp: 5 mL, Rfl: 5 .  omeprazole (PRILOSEC) 20 MG capsule, TAKE 1 CAPSULE BY MOUTH ONCE DAILY 30 MINUTES TO 1 HOUR BEFORE A MEAL, Disp: 90 capsule, Rfl: 1 .  trolamine salicylate (ASPERCREME) 10 % cream, Apply 1 application topically as needed for muscle pain., Disp: , Rfl:  .  amLODipine (NORVASC) 10 MG tablet, TAKE 1 TABLET BY MOUTH ONCE DAILY, Disp: 90 tablet, Rfl: 1 .  PAZEO 0.7 % SOLN, INSTILL 1 DROP INTO BOTH EYES DAILY. (Patient not taking: Reported on 01/31/2020), Disp: 2.5 mL, Rfl: 0 .  rosuvastatin (CRESTOR) 20 MG tablet, TAKE 1 TABLET BY MOUTH ONCE DAILY, Disp: 90 tablet, Rfl: 1   Allergies  Allergen Reactions  . Amoxicillin Diarrhea    Has patient had a PCN reaction causing immediate rash, facial/tongue/throat swelling, SOB or lightheadedness with hypotension: no Has patient had a PCN reaction causing severe rash involving mucus membranes or skin necrosis: no Has patient had a PCN reaction that required hospitalization: no pharmacist consult Has patient had a PCN reaction occurring within the last 10 years: yes If all of the above answers are "NO", then may proceed with Cephalosporin use.   . Ampicillin Rash    - Tolerates Rocephin and Ancef - Remote occurrence; no symptoms of anaphylaxis or severe cutaneous reaction, and no additional medical attention required    . Sulfa Antibiotics Rash     Review of Systems  Constitutional: Positive for fatigue.  Eyes: Negative for blurred vision.  Respiratory: Negative.  Negative for shortness of breath.   Cardiovascular: Negative.  Negative  for chest pain.  Gastrointestinal: Negative.   Neurological: Negative.  Negative for headaches.  Psychiatric/Behavioral: Negative.      Today's Vitals   02/09/20 1418  BP: (!) 146/80  Pulse: 94  Temp: 98.3 F (36.8 C)  TempSrc: Oral  Weight: 146 lb (66.2 kg)  Height: '5\' 5"'  (1.651 m)  PainSc: 5   PainLoc: Knee   Body mass index is 24.3 kg/m.   Objective:  Physical Exam Vitals and nursing note reviewed.  Constitutional:      Appearance: Normal appearance.  HENT:     Head: Normocephalic and atraumatic.  Cardiovascular:     Rate and Rhythm: Normal rate and regular rhythm.     Heart sounds: Normal heart sounds.  Pulmonary:     Effort: Pulmonary effort is normal.     Breath sounds: Normal breath sounds.  Musculoskeletal:     Comments: Ambulatory with  cane  Skin:    General: Skin is warm.  Neurological:     General: No focal deficit present.     Mental Status: She is alert.  Psychiatric:        Mood and Affect: Mood normal.        Behavior: Behavior normal.         Assessment And Plan:     1. Type 2 diabetes mellitus with stage 3 chronic kidney disease, without long-term current use of insulin, unspecified whether stage 3a or 3b CKD ([place])  Comments: Chronic, will check labs as listed below. I will adjust meds as needed. Encouraged to stay well hydrated, given stage 3 CKD.   - BMP8+EGFR - Hemoglobin A1c  2. Hypertensive nephropathy  Comments: Chronic, fair control. She will continue with current meds for now. Encouraged to avoid adding salt to her foods.   - POCT Urinalysis Dipstick (81002)  3. Fatigue, unspecified type  Comments: Chronic. Encouraged to stay well hydrated. I question if an electrolyte imbalance is contributing to her sx since she often has diarrhea. May benefit from magnesium supplementation; however, this could exacerbate her bowel issues.   4. Abnormality of gait due to impairment of balance  Comments: I will refer her to home health PT  for further evaluation/strength and balance training.   - Ambulatory referral to [facility]     Patient was given opportunity to ask questions. Patient verbalized understanding of the plan and was able to repeat key elements of the plan. All questions were answered to their satisfaction.  Maximino Greenland, MD   I, Maximino Greenland, MD, have reviewed all documentation for this visit. The documentation on 02/26/20 for the exam, diagnosis, procedures, and orders are all accurate and complete.  THE PATIENT IS ENCOURAGED TO PRACTICE SOCIAL DISTANCING DUE TO THE COVID-19 PANDEMIC.

## 2020-02-29 DIAGNOSIS — E785 Hyperlipidemia, unspecified: Secondary | ICD-10-CM | POA: Diagnosis not present

## 2020-02-29 DIAGNOSIS — M25562 Pain in left knee: Secondary | ICD-10-CM | POA: Diagnosis not present

## 2020-02-29 DIAGNOSIS — I129 Hypertensive chronic kidney disease with stage 1 through stage 4 chronic kidney disease, or unspecified chronic kidney disease: Secondary | ICD-10-CM | POA: Diagnosis not present

## 2020-02-29 DIAGNOSIS — E1122 Type 2 diabetes mellitus with diabetic chronic kidney disease: Secondary | ICD-10-CM | POA: Diagnosis not present

## 2020-02-29 DIAGNOSIS — M160 Bilateral primary osteoarthritis of hip: Secondary | ICD-10-CM | POA: Diagnosis not present

## 2020-02-29 DIAGNOSIS — N2889 Other specified disorders of kidney and ureter: Secondary | ICD-10-CM | POA: Diagnosis not present

## 2020-02-29 DIAGNOSIS — M25561 Pain in right knee: Secondary | ICD-10-CM | POA: Diagnosis not present

## 2020-02-29 DIAGNOSIS — D1771 Benign lipomatous neoplasm of kidney: Secondary | ICD-10-CM | POA: Diagnosis not present

## 2020-02-29 DIAGNOSIS — Z8673 Personal history of transient ischemic attack (TIA), and cerebral infarction without residual deficits: Secondary | ICD-10-CM | POA: Diagnosis not present

## 2020-02-29 DIAGNOSIS — K440 Diaphragmatic hernia with obstruction, without gangrene: Secondary | ICD-10-CM | POA: Diagnosis not present

## 2020-02-29 DIAGNOSIS — N183 Chronic kidney disease, stage 3 unspecified: Secondary | ICD-10-CM | POA: Diagnosis not present

## 2020-03-02 DIAGNOSIS — I129 Hypertensive chronic kidney disease with stage 1 through stage 4 chronic kidney disease, or unspecified chronic kidney disease: Secondary | ICD-10-CM | POA: Diagnosis not present

## 2020-03-02 DIAGNOSIS — K440 Diaphragmatic hernia with obstruction, without gangrene: Secondary | ICD-10-CM | POA: Diagnosis not present

## 2020-03-02 DIAGNOSIS — E785 Hyperlipidemia, unspecified: Secondary | ICD-10-CM | POA: Diagnosis not present

## 2020-03-02 DIAGNOSIS — M25561 Pain in right knee: Secondary | ICD-10-CM | POA: Diagnosis not present

## 2020-03-02 DIAGNOSIS — Z8673 Personal history of transient ischemic attack (TIA), and cerebral infarction without residual deficits: Secondary | ICD-10-CM | POA: Diagnosis not present

## 2020-03-02 DIAGNOSIS — M25562 Pain in left knee: Secondary | ICD-10-CM | POA: Diagnosis not present

## 2020-03-02 DIAGNOSIS — M160 Bilateral primary osteoarthritis of hip: Secondary | ICD-10-CM | POA: Diagnosis not present

## 2020-03-02 DIAGNOSIS — N183 Chronic kidney disease, stage 3 unspecified: Secondary | ICD-10-CM | POA: Diagnosis not present

## 2020-03-02 DIAGNOSIS — E1122 Type 2 diabetes mellitus with diabetic chronic kidney disease: Secondary | ICD-10-CM | POA: Diagnosis not present

## 2020-03-02 DIAGNOSIS — N2889 Other specified disorders of kidney and ureter: Secondary | ICD-10-CM | POA: Diagnosis not present

## 2020-03-02 DIAGNOSIS — D1771 Benign lipomatous neoplasm of kidney: Secondary | ICD-10-CM | POA: Diagnosis not present

## 2020-03-05 DIAGNOSIS — N183 Chronic kidney disease, stage 3 unspecified: Secondary | ICD-10-CM | POA: Diagnosis not present

## 2020-03-05 DIAGNOSIS — N2889 Other specified disorders of kidney and ureter: Secondary | ICD-10-CM | POA: Diagnosis not present

## 2020-03-05 DIAGNOSIS — M160 Bilateral primary osteoarthritis of hip: Secondary | ICD-10-CM | POA: Diagnosis not present

## 2020-03-05 DIAGNOSIS — K440 Diaphragmatic hernia with obstruction, without gangrene: Secondary | ICD-10-CM | POA: Diagnosis not present

## 2020-03-05 DIAGNOSIS — E785 Hyperlipidemia, unspecified: Secondary | ICD-10-CM | POA: Diagnosis not present

## 2020-03-05 DIAGNOSIS — E1122 Type 2 diabetes mellitus with diabetic chronic kidney disease: Secondary | ICD-10-CM | POA: Diagnosis not present

## 2020-03-05 DIAGNOSIS — D1771 Benign lipomatous neoplasm of kidney: Secondary | ICD-10-CM | POA: Diagnosis not present

## 2020-03-05 DIAGNOSIS — M25562 Pain in left knee: Secondary | ICD-10-CM | POA: Diagnosis not present

## 2020-03-05 DIAGNOSIS — Z8673 Personal history of transient ischemic attack (TIA), and cerebral infarction without residual deficits: Secondary | ICD-10-CM | POA: Diagnosis not present

## 2020-03-05 DIAGNOSIS — M25561 Pain in right knee: Secondary | ICD-10-CM | POA: Diagnosis not present

## 2020-03-05 DIAGNOSIS — I129 Hypertensive chronic kidney disease with stage 1 through stage 4 chronic kidney disease, or unspecified chronic kidney disease: Secondary | ICD-10-CM | POA: Diagnosis not present

## 2020-03-07 DIAGNOSIS — M25561 Pain in right knee: Secondary | ICD-10-CM | POA: Diagnosis not present

## 2020-03-07 DIAGNOSIS — K440 Diaphragmatic hernia with obstruction, without gangrene: Secondary | ICD-10-CM | POA: Diagnosis not present

## 2020-03-07 DIAGNOSIS — E1122 Type 2 diabetes mellitus with diabetic chronic kidney disease: Secondary | ICD-10-CM | POA: Diagnosis not present

## 2020-03-07 DIAGNOSIS — M25562 Pain in left knee: Secondary | ICD-10-CM | POA: Diagnosis not present

## 2020-03-07 DIAGNOSIS — I129 Hypertensive chronic kidney disease with stage 1 through stage 4 chronic kidney disease, or unspecified chronic kidney disease: Secondary | ICD-10-CM | POA: Diagnosis not present

## 2020-03-07 DIAGNOSIS — M160 Bilateral primary osteoarthritis of hip: Secondary | ICD-10-CM | POA: Diagnosis not present

## 2020-03-07 DIAGNOSIS — N2889 Other specified disorders of kidney and ureter: Secondary | ICD-10-CM | POA: Diagnosis not present

## 2020-03-07 DIAGNOSIS — Z8673 Personal history of transient ischemic attack (TIA), and cerebral infarction without residual deficits: Secondary | ICD-10-CM | POA: Diagnosis not present

## 2020-03-07 DIAGNOSIS — N183 Chronic kidney disease, stage 3 unspecified: Secondary | ICD-10-CM | POA: Diagnosis not present

## 2020-03-07 DIAGNOSIS — E785 Hyperlipidemia, unspecified: Secondary | ICD-10-CM | POA: Diagnosis not present

## 2020-03-07 DIAGNOSIS — D1771 Benign lipomatous neoplasm of kidney: Secondary | ICD-10-CM | POA: Diagnosis not present

## 2020-03-12 DIAGNOSIS — M25562 Pain in left knee: Secondary | ICD-10-CM | POA: Diagnosis not present

## 2020-03-12 DIAGNOSIS — M160 Bilateral primary osteoarthritis of hip: Secondary | ICD-10-CM | POA: Diagnosis not present

## 2020-03-12 DIAGNOSIS — M25561 Pain in right knee: Secondary | ICD-10-CM | POA: Diagnosis not present

## 2020-03-12 DIAGNOSIS — D1771 Benign lipomatous neoplasm of kidney: Secondary | ICD-10-CM | POA: Diagnosis not present

## 2020-03-12 DIAGNOSIS — E1122 Type 2 diabetes mellitus with diabetic chronic kidney disease: Secondary | ICD-10-CM | POA: Diagnosis not present

## 2020-03-12 DIAGNOSIS — Z8673 Personal history of transient ischemic attack (TIA), and cerebral infarction without residual deficits: Secondary | ICD-10-CM | POA: Diagnosis not present

## 2020-03-12 DIAGNOSIS — I129 Hypertensive chronic kidney disease with stage 1 through stage 4 chronic kidney disease, or unspecified chronic kidney disease: Secondary | ICD-10-CM | POA: Diagnosis not present

## 2020-03-12 DIAGNOSIS — E785 Hyperlipidemia, unspecified: Secondary | ICD-10-CM | POA: Diagnosis not present

## 2020-03-12 DIAGNOSIS — N2889 Other specified disorders of kidney and ureter: Secondary | ICD-10-CM | POA: Diagnosis not present

## 2020-03-12 DIAGNOSIS — K440 Diaphragmatic hernia with obstruction, without gangrene: Secondary | ICD-10-CM | POA: Diagnosis not present

## 2020-03-12 DIAGNOSIS — N183 Chronic kidney disease, stage 3 unspecified: Secondary | ICD-10-CM | POA: Diagnosis not present

## 2020-03-13 ENCOUNTER — Ambulatory Visit (INDEPENDENT_AMBULATORY_CARE_PROVIDER_SITE_OTHER): Payer: Medicare Other | Admitting: Nurse Practitioner

## 2020-03-13 ENCOUNTER — Encounter: Payer: Self-pay | Admitting: Nurse Practitioner

## 2020-03-13 ENCOUNTER — Other Ambulatory Visit: Payer: Self-pay

## 2020-03-13 VITALS — BP 162/88 | HR 67 | Temp 98.0°F | Wt 150.2 lb

## 2020-03-13 DIAGNOSIS — S0993XA Unspecified injury of face, initial encounter: Secondary | ICD-10-CM

## 2020-03-13 DIAGNOSIS — W19XXXA Unspecified fall, initial encounter: Secondary | ICD-10-CM

## 2020-03-13 DIAGNOSIS — R350 Frequency of micturition: Secondary | ICD-10-CM | POA: Diagnosis not present

## 2020-03-13 DIAGNOSIS — R5383 Other fatigue: Secondary | ICD-10-CM | POA: Diagnosis not present

## 2020-03-13 LAB — POCT URINALYSIS DIPSTICK
Bilirubin, UA: NEGATIVE
Blood, UA: NEGATIVE
Glucose, UA: NEGATIVE
Ketones, UA: NEGATIVE
Leukocytes, UA: NEGATIVE
Nitrite, UA: NEGATIVE
Protein, UA: POSITIVE — AB
Spec Grav, UA: 1.02 (ref 1.010–1.025)
Urobilinogen, UA: 0.2 E.U./dL
pH, UA: 6 (ref 5.0–8.0)

## 2020-03-13 NOTE — Progress Notes (Signed)
I,Yamilka Roman [employer] as a Education administrator for [employer], FNP.,have documented all relevant documentation on the behalf of Minette Brine, FNP,as directed by  Minette Brine, FNP while in the presence of Minette Brine, [place]. This visit occurred during the SARS-CoV-2 public health emergency.  Safety protocols were in place, including screening questions prior to the visit, additional usage of staff PPE, and extensive cleaning of exam room while observing appropriate contact time as indicated for disinfecting solutions.  Subjective:     Patient ID: Anna Hoffman , female    DOB: Dec 31, 1934 , 85 y.o.   MRN: [id number]   Chief Complaint  Patient presents with  . Fall    patient stated she fell. she stated she thinks she passed out     HPI  She is here today after having a fall while coming out of her utility room she fell forwards on her face.  She is now just feeling bad.  Continues to have a headache.  She placed an ice pack on her head. She was able to get up on her she crawled to the dining room until she could get in the chair. She got up and got dressed and still went to eat.     Past Medical History:  Diagnosis Date  . Arthritis   . Diabetes mellitus without complication ([place])   . Hypertension   . Hypokalemia   . Pneumonia   . Shingles   . Stroke [facility])      Family History  Problem Relation Age of Onset  . Alzheimer's disease Mother   . Prostate cancer Father   . Brain cancer Brother   . Brain cancer Brother   . Pancreatic cancer Sister   . Allergic rhinitis Neg Hx   . Asthma Neg Hx   . Eczema Neg Hx   . Urticaria Neg Hx      Current Outpatient Medications:  .  acetaminophen (TYLENOL) 325 MG tablet, Take 2 tablets (650 mg total) by mouth every 6 (six) hours as needed for mild pain (or Fever >/= 101)., Disp: , Rfl:  .  amLODipine (NORVASC) 10 MG tablet, TAKE 1 TABLET BY MOUTH ONCE DAILY, Disp: 90 tablet, Rfl: 1 .  Ascorbic Acid (VITAMIN C) 1000 MG tablet, Take 1,000 mg by  mouth daily., Disp: , Rfl:  .  aspirin EC 81 MG tablet, Take 81 mg by mouth daily., Disp: , Rfl:  .  azelastine (ASTELIN) 0.1 % nasal spray, Place 2 sprays into both nostrils 2 (two) times daily. Use in each nostril as directed (Patient taking differently: Place 2 sprays into both nostrils daily as needed for rhinitis. ), Disp: 30 mL, Rfl: 5 .  benzonatate (TESSALON PERLES) 100 MG capsule, Take 1 capsule (100 mg total) by mouth 3 (three) times daily as needed for cough., Disp: 30 capsule, Rfl: 0 .  Carboxymethylcellul-Glycerin (LUBRICATING EYE DROPS OP), Apply 1 drop to eye daily as needed (dry eyes). , Disp: , Rfl:  .  cephALEXin (KEFLEX) 500 MG capsule, Take 1 capsule (500 mg total) by mouth 2 (two) times daily., Disp: 14 capsule, Rfl: 0 .  Cyanocobalamin (B-12 COMPLIANCE INJECTION) 1000 MCG/ML KIT, Inject 1,000 mcg as directed every 30 (thirty) days., Disp: , Rfl:  .  Fexofenadine HCl (ALLEGRA PO), Take 180 mg by mouth at bedtime. , Disp: , Rfl:  .  glucose blood (ONETOUCH VERIO) test strip, 1 each by Other route as needed for other. Use as instructed to check blood sugar 2 times  per day dx: e11.65, Disp: 100 each, Rfl: 11 .  irbesartan (AVAPRO) 150 MG tablet, TAKE 1 TABLET BY MOUTH EVERY DAY. (Patient taking differently: Take 150 mg by mouth daily. ), Disp: 90 tablet, Rfl: 0 .  Lancets (ONETOUCH ULTRASOFT) lancets, Use as instructed to check blood sugar 2 times per day dx code e11.65, Disp: 100 each, Rfl: 12 .  Multiple Vitamins-Minerals (STRESS TAB NF PO), Take 1 tablet by mouth daily., Disp: , Rfl:  .  olopatadine (PATANOL) 0.1 % ophthalmic solution, Place 1 drop into both eyes 2 (two) times daily., Disp: 5 mL, Rfl: 5 .  omeprazole (PRILOSEC) 20 MG capsule, TAKE 1 CAPSULE BY MOUTH ONCE DAILY 30 MINUTES TO 1 HOUR BEFORE A MEAL, Disp: 90 capsule, Rfl: 1 .  rosuvastatin (CRESTOR) 20 MG tablet, TAKE 1 TABLET BY MOUTH ONCE DAILY, Disp: 90 tablet, Rfl: 1 .  trolamine salicylate (ASPERCREME) 10 %  cream, Apply 1 application topically as needed for muscle pain., Disp: , Rfl:  .  PAZEO 0.7 % SOLN, INSTILL 1 DROP INTO BOTH EYES DAILY. (Patient not taking: Reported on 01/31/2020), Disp: 2.5 mL, Rfl: 0   Allergies  Allergen Reactions  . Amoxicillin Diarrhea    Has patient had a PCN reaction causing immediate rash, facial/tongue/throat swelling, SOB or lightheadedness with hypotension: no Has patient had a PCN reaction causing severe rash involving mucus membranes or skin necrosis: no Has patient had a PCN reaction that required hospitalization: no pharmacist consult Has patient had a PCN reaction occurring within the last 10 years: yes If all of the above answers are "NO", then may proceed with Cephalosporin use.   . Ampicillin Rash    - Tolerates Rocephin and Ancef - Remote occurrence; no symptoms of anaphylaxis or severe cutaneous reaction, and no additional medical attention required    . Sulfa Antibiotics Rash     Review of Systems  Constitutional: Negative for fatigue.  Respiratory: Negative.   Cardiovascular: Negative.  Negative for chest pain, palpitations and leg swelling.  Neurological: Positive for headaches. Negative for dizziness.  Psychiatric/Behavioral: Negative.      Today's Vitals   03/13/20 1026  BP: (!) 162/88  Pulse: 67  Temp: 98 F (36.7 C)  Weight: 150 lb 3.2 oz (68.1 kg)  PainSc: 2    Body mass index is 24.99 kg/m.   Objective:  Physical Exam Vitals reviewed.  Constitutional:      General: She is not in acute distress.    Appearance: Normal appearance.  HENT:     Head:     Comments: Left side of forehead has knot present. She has bruising to left eye with tenderness above her eye. The bruising goes down to her cheek.  Cardiovascular:     Rate and Rhythm: Normal rate and regular rhythm.     Pulses: Normal pulses.     Heart sounds: Normal heart sounds. No murmur heard.   Pulmonary:     Effort: Pulmonary effort is normal. No respiratory  distress.     Breath sounds: Normal breath sounds.  Skin:    Capillary Refill: Capillary refill takes less than 2 seconds.  Neurological:     General: No focal deficit present.     Mental Status: She is alert and oriented to person, place, and time.     Cranial Nerves: No cranial nerve deficit.  Psychiatric:        Mood and Affect: Mood normal.        Behavior: Behavior normal.  Thought Content: Thought content normal.        Judgment: Judgment normal.         Assessment And Plan:     1. Fall, initial encounter  Unknown cause for fall will check for infection and kidney functions - CT Head Wo Contrast; Future  2. Facial injury, initial encounter  Left forehead has tender knot present and has discolored eye around her eye.  - CT Head Wo Contrast; Future  3. Fatigue, unspecified type  Will check metabolic causes  Her most recent vitamin B12 was elevated so will hold off on injection until get labs back. - CBC - CMP14+EGFR  4. Frequency of urination   Urinalysis is negative - POCT Urinalysis Dipstick (48185)     Patient was given opportunity to ask questions. Patient verbalized understanding of the plan and was able to repeat key elements of the plan. All questions were answered to their satisfaction.  Minette Brine, FNP   I, Minette Brine, FNP, have reviewed all documentation for this visit. The documentation on 03/14/20 for the exam, diagnosis, procedures, and orders are all accurate and complete.  THE PATIENT IS ENCOURAGED TO PRACTICE SOCIAL DISTANCING DUE TO THE COVID-19 PANDEMIC.

## 2020-03-14 ENCOUNTER — Encounter: Payer: Self-pay | Admitting: Nurse Practitioner

## 2020-03-14 DIAGNOSIS — Z961 Presence of intraocular lens: Secondary | ICD-10-CM | POA: Diagnosis not present

## 2020-03-14 DIAGNOSIS — S0512XA Contusion of eyeball and orbital tissues, left eye, initial encounter: Secondary | ICD-10-CM | POA: Diagnosis not present

## 2020-03-14 DIAGNOSIS — H04123 Dry eye syndrome of bilateral lacrimal glands: Secondary | ICD-10-CM | POA: Diagnosis not present

## 2020-03-14 DIAGNOSIS — E1122 Type 2 diabetes mellitus with diabetic chronic kidney disease: Secondary | ICD-10-CM | POA: Diagnosis not present

## 2020-03-14 DIAGNOSIS — N183 Chronic kidney disease, stage 3 unspecified: Secondary | ICD-10-CM | POA: Diagnosis not present

## 2020-03-14 DIAGNOSIS — Z8673 Personal history of transient ischemic attack (TIA), and cerebral infarction without residual deficits: Secondary | ICD-10-CM | POA: Diagnosis not present

## 2020-03-14 DIAGNOSIS — M25561 Pain in right knee: Secondary | ICD-10-CM | POA: Diagnosis not present

## 2020-03-14 DIAGNOSIS — M25562 Pain in left knee: Secondary | ICD-10-CM | POA: Diagnosis not present

## 2020-03-14 DIAGNOSIS — I129 Hypertensive chronic kidney disease with stage 1 through stage 4 chronic kidney disease, or unspecified chronic kidney disease: Secondary | ICD-10-CM | POA: Diagnosis not present

## 2020-03-14 DIAGNOSIS — M160 Bilateral primary osteoarthritis of hip: Secondary | ICD-10-CM | POA: Diagnosis not present

## 2020-03-14 DIAGNOSIS — K440 Diaphragmatic hernia with obstruction, without gangrene: Secondary | ICD-10-CM | POA: Diagnosis not present

## 2020-03-14 DIAGNOSIS — H532 Diplopia: Secondary | ICD-10-CM | POA: Diagnosis not present

## 2020-03-14 DIAGNOSIS — N2889 Other specified disorders of kidney and ureter: Secondary | ICD-10-CM | POA: Diagnosis not present

## 2020-03-14 DIAGNOSIS — E119 Type 2 diabetes mellitus without complications: Secondary | ICD-10-CM | POA: Diagnosis not present

## 2020-03-14 DIAGNOSIS — E785 Hyperlipidemia, unspecified: Secondary | ICD-10-CM | POA: Diagnosis not present

## 2020-03-14 DIAGNOSIS — D1771 Benign lipomatous neoplasm of kidney: Secondary | ICD-10-CM | POA: Diagnosis not present

## 2020-03-14 LAB — CMP14+EGFR
ALT: 17 IU/L (ref 0–32)
AST: 21 IU/L (ref 0–40)
Albumin/Globulin Ratio: 1.8 (ref 1.2–2.2)
Albumin: 4.7 g/dL — ABNORMAL HIGH (ref 3.6–4.6)
Alkaline Phosphatase: 97 IU/L (ref 48–121)
BUN/Creatinine Ratio: 21 (ref 12–28)
BUN: 21 mg/dL (ref 8–27)
Bilirubin Total: 0.2 mg/dL (ref 0.0–1.2)
CO2: 19 mmol/L — ABNORMAL LOW (ref 20–29)
Calcium: 9.4 mg/dL (ref 8.7–10.3)
Chloride: 105 mmol/L (ref 96–106)
Creatinine, Ser: 1 mg/dL (ref 0.57–1.00)
GFR calc Af Amer: 60 mL/min/{1.73_m2} (ref >59)
GFR calc non Af Amer: 52 mL/min/{1.73_m2} — ABNORMAL LOW (ref >59)
Globulin, Total: 2.6 g/dL (ref 1.5–4.5)
Glucose: 88 mg/dL (ref 65–99)
Potassium: 4.1 mmol/L (ref 3.5–5.2)
Sodium: 141 mmol/L (ref 134–144)
Total Protein: 7.3 g/dL (ref 6.0–8.5)

## 2020-03-14 LAB — CBC
Hematocrit: 39.6 % (ref 34.0–46.6)
Hemoglobin: 12.7 g/dL (ref 11.1–15.9)
MCH: 28.4 pg (ref 26.6–33.0)
MCHC: 32.1 g/dL (ref 31.5–35.7)
MCV: 89 fL (ref 79–97)
Platelets: 318 10*3/uL (ref 150–450)
RBC: 4.47 x10E6/uL (ref 3.77–5.28)
RDW: 13.4 % (ref 11.7–15.4)
WBC: 8.8 10*3/uL (ref 3.4–10.8)

## 2020-03-19 ENCOUNTER — Ambulatory Visit
Admission: RE | Admit: 2020-03-19 | Discharge: 2020-03-19 | Disposition: A | Discharge status: Home / Self Care | Payer: Medicare Other | Source: Ambulatory Visit | Attending: Nurse Practitioner | Admitting: Nurse Practitioner

## 2020-03-19 DIAGNOSIS — S0993XA Unspecified injury of face, initial encounter: Secondary | ICD-10-CM

## 2020-03-19 DIAGNOSIS — J3489 Other specified disorders of nose and nasal sinuses: Secondary | ICD-10-CM | POA: Diagnosis not present

## 2020-03-19 DIAGNOSIS — R220 Localized swelling, mass and lump, head: Secondary | ICD-10-CM | POA: Diagnosis not present

## 2020-03-19 DIAGNOSIS — W19XXXA Unspecified fall, initial encounter: Secondary | ICD-10-CM

## 2020-03-20 ENCOUNTER — Telehealth: Payer: Self-pay | Admitting: Nurse Practitioner

## 2020-03-20 NOTE — Telephone Encounter (Signed)
Called to give her results of her CT scan and labs.  She is using her nasal spray and doing a nasal wash. Ct scan showed mucosal thickening. If she is not better she may need antibiotics.

## 2020-03-21 DIAGNOSIS — N183 Chronic kidney disease, stage 3 unspecified: Secondary | ICD-10-CM | POA: Diagnosis not present

## 2020-03-21 DIAGNOSIS — E1122 Type 2 diabetes mellitus with diabetic chronic kidney disease: Secondary | ICD-10-CM | POA: Diagnosis not present

## 2020-03-21 DIAGNOSIS — M25561 Pain in right knee: Secondary | ICD-10-CM | POA: Diagnosis not present

## 2020-03-21 DIAGNOSIS — D1771 Benign lipomatous neoplasm of kidney: Secondary | ICD-10-CM | POA: Diagnosis not present

## 2020-03-21 DIAGNOSIS — M160 Bilateral primary osteoarthritis of hip: Secondary | ICD-10-CM | POA: Diagnosis not present

## 2020-03-21 DIAGNOSIS — Z8673 Personal history of transient ischemic attack (TIA), and cerebral infarction without residual deficits: Secondary | ICD-10-CM | POA: Diagnosis not present

## 2020-03-21 DIAGNOSIS — E785 Hyperlipidemia, unspecified: Secondary | ICD-10-CM | POA: Diagnosis not present

## 2020-03-21 DIAGNOSIS — M25562 Pain in left knee: Secondary | ICD-10-CM | POA: Diagnosis not present

## 2020-03-21 DIAGNOSIS — I129 Hypertensive chronic kidney disease with stage 1 through stage 4 chronic kidney disease, or unspecified chronic kidney disease: Secondary | ICD-10-CM | POA: Diagnosis not present

## 2020-03-21 DIAGNOSIS — N2889 Other specified disorders of kidney and ureter: Secondary | ICD-10-CM | POA: Diagnosis not present

## 2020-03-21 DIAGNOSIS — K440 Diaphragmatic hernia with obstruction, without gangrene: Secondary | ICD-10-CM | POA: Diagnosis not present

## 2020-03-22 DIAGNOSIS — M170 Bilateral primary osteoarthritis of knee: Secondary | ICD-10-CM | POA: Diagnosis not present

## 2020-03-23 DIAGNOSIS — D1771 Benign lipomatous neoplasm of kidney: Secondary | ICD-10-CM | POA: Diagnosis not present

## 2020-03-23 DIAGNOSIS — M25561 Pain in right knee: Secondary | ICD-10-CM | POA: Diagnosis not present

## 2020-03-23 DIAGNOSIS — M25562 Pain in left knee: Secondary | ICD-10-CM | POA: Diagnosis not present

## 2020-03-23 DIAGNOSIS — K440 Diaphragmatic hernia with obstruction, without gangrene: Secondary | ICD-10-CM | POA: Diagnosis not present

## 2020-03-23 DIAGNOSIS — M160 Bilateral primary osteoarthritis of hip: Secondary | ICD-10-CM | POA: Diagnosis not present

## 2020-03-23 DIAGNOSIS — Z8673 Personal history of transient ischemic attack (TIA), and cerebral infarction without residual deficits: Secondary | ICD-10-CM | POA: Diagnosis not present

## 2020-03-23 DIAGNOSIS — E785 Hyperlipidemia, unspecified: Secondary | ICD-10-CM | POA: Diagnosis not present

## 2020-03-23 DIAGNOSIS — E1122 Type 2 diabetes mellitus with diabetic chronic kidney disease: Secondary | ICD-10-CM | POA: Diagnosis not present

## 2020-03-23 DIAGNOSIS — I129 Hypertensive chronic kidney disease with stage 1 through stage 4 chronic kidney disease, or unspecified chronic kidney disease: Secondary | ICD-10-CM | POA: Diagnosis not present

## 2020-03-23 DIAGNOSIS — N183 Chronic kidney disease, stage 3 unspecified: Secondary | ICD-10-CM | POA: Diagnosis not present

## 2020-03-23 DIAGNOSIS — N2889 Other specified disorders of kidney and ureter: Secondary | ICD-10-CM | POA: Diagnosis not present

## 2020-03-26 DIAGNOSIS — S0502XA Injury of conjunctiva and corneal abrasion without foreign body, left eye, initial encounter: Secondary | ICD-10-CM | POA: Diagnosis not present

## 2020-03-26 MED FILL — OFLOXACIN 0.3% EYE DROPS: 0.3 | 25 days supply | Qty: 5 | Fill #0 | Status: TO

## 2020-03-26 MED FILL — OFLOXACIN 0.3% EYE DROPS: 0.3 | 25 days supply | Qty: 5 | Fill #0

## 2020-03-27 DIAGNOSIS — N2889 Other specified disorders of kidney and ureter: Secondary | ICD-10-CM | POA: Diagnosis not present

## 2020-03-27 DIAGNOSIS — Z8673 Personal history of transient ischemic attack (TIA), and cerebral infarction without residual deficits: Secondary | ICD-10-CM | POA: Diagnosis not present

## 2020-03-27 DIAGNOSIS — I129 Hypertensive chronic kidney disease with stage 1 through stage 4 chronic kidney disease, or unspecified chronic kidney disease: Secondary | ICD-10-CM | POA: Diagnosis not present

## 2020-03-27 DIAGNOSIS — M160 Bilateral primary osteoarthritis of hip: Secondary | ICD-10-CM | POA: Diagnosis not present

## 2020-03-27 DIAGNOSIS — M25561 Pain in right knee: Secondary | ICD-10-CM | POA: Diagnosis not present

## 2020-03-27 DIAGNOSIS — N183 Chronic kidney disease, stage 3 unspecified: Secondary | ICD-10-CM | POA: Diagnosis not present

## 2020-03-27 DIAGNOSIS — E785 Hyperlipidemia, unspecified: Secondary | ICD-10-CM | POA: Diagnosis not present

## 2020-03-27 DIAGNOSIS — D1771 Benign lipomatous neoplasm of kidney: Secondary | ICD-10-CM | POA: Diagnosis not present

## 2020-03-27 DIAGNOSIS — M25562 Pain in left knee: Secondary | ICD-10-CM | POA: Diagnosis not present

## 2020-03-27 DIAGNOSIS — K440 Diaphragmatic hernia with obstruction, without gangrene: Secondary | ICD-10-CM | POA: Diagnosis not present

## 2020-03-27 DIAGNOSIS — E1122 Type 2 diabetes mellitus with diabetic chronic kidney disease: Secondary | ICD-10-CM | POA: Diagnosis not present

## 2020-03-28 ENCOUNTER — Telehealth: Payer: Medicare Other

## 2020-03-28 DIAGNOSIS — S0502XA Injury of conjunctiva and corneal abrasion without foreign body, left eye, initial encounter: Secondary | ICD-10-CM | POA: Diagnosis not present

## 2020-03-28 DIAGNOSIS — S0512XD Contusion of eyeball and orbital tissues, left eye, subsequent encounter: Secondary | ICD-10-CM | POA: Diagnosis not present

## 2020-03-30 ENCOUNTER — Ambulatory Visit: Payer: Medicare Other

## 2020-03-30 DIAGNOSIS — N183 Chronic kidney disease, stage 3 unspecified: Secondary | ICD-10-CM

## 2020-03-30 NOTE — Patient Instructions (Signed)
Social Worker Visit Information  Goals we discussed today:  Goals Addressed            This Visit's Progress    High Risk for readmission       Current Barriers:   Knowledge Deficits related to disease process and Self health management of Colitis  Chronic Disease Management support and education needs related to Colitis, DM, HTN  Nurse Case Manager Clinical Goal(s):   Over the next 45 days, patient will work with the Gastroenterologist to address needs related to evaluation and treatment of Colitis - Goal Met   Over the next 90 days, patient will not experience hospital admission. Hospital Admissions in last 6 months = 3 Goal Not Met   New 02/14/20 Over the next 90 days, patient will not experience hospital admissions and or ED visits. Hospital Admissions/ED visits in last 6 months = 3, ED visits = 2  CCM SW Interventions: Completed 03/30/20  Successful outbound call placed to the patient to assess goal progression  Determined the patient is feeling well, reports a cough over the last several days  Advised the patient to contact her primary care provider if cough persists  o Patient stated understanding  Collaboration with [name] to update on patient goal progression  Scheduled follow up call with RN CM over the next 60 days  CCM RN CM Interventions:  02/14/20 call completed with patient   Evaluation of current treatment plan related to Infectious Colitis and Cystitis and patient's adherence to plan as established by provider  Determined patient underwent a Colonoscopy procedure on 09/05/19 and was noted to have 7 benign polyps removed from various parts of her intestine  Determined since [name]'s last ED visit for symptoms suggestive of Colitis and Diverticulitis, she has remained to be asymptomatic and feels she is progressing toward wellness, she denies persistent GI s/s since having her Colonoscopy  Discussed patient is taking a probiotic and avoiding  eating foods she feels may trigger Colitis and or Diverticulitis  Discussed most recent ED visit on 01/31/20 indicated patient had a UTI for which she was prescribed an antibiotic   Determined patient has since f/u with PCP Dr. Glendale Chard, her urinalysis was negative for infection, patient states she has 4 doses of Cephalexin to take in which she plans to do per instructions of Dr. Zenon Mayo on early s/s of UTI and when to call the MD, potential cause for UTI and how to maintain good urinary health  Discussed plans with patient for ongoing care management follow up and provided patient with direct contact information for care management team  Patient Self Care Activities:   Self administers medications as prescribed  Attends all scheduled provider appointments  Calls pharmacy for medication refills  Performs ADL's independently  Performs IADL's independently  Calls provider office for new concerns or questions  Please see past updates related to this goal by clicking on the "Past Updates" button in the selected goal          Follow Up Plan: [facility] will follow up with the patient over the next 60 days.  Anna Hoffman, BSW, CDP Social Worker, Certified Dementia Practitioner [place] [facility] [place] [facility] [phone]

## 2020-03-30 NOTE — Chronic Care Management (AMB) (Signed)
Chronic Care Management    Social Work Follow Up Note  03/30/2020 Name: Anna Hoffman MRN: [id number] DOB: [birth year]  Anna Hoffman is a 85 y.o. year old female who is a primary care patient of Glendale Chard, MD. The CCM team was consulted for assistance with care coordination.   Review of patient status, including review of consultants reports, other relevant assessments, and collaboration with appropriate care team members and the patient's provider was performed as part of comprehensive patient evaluation and provision of chronic care management services.    SDOH (Social Determinants of Health) assessments performed: No    Outpatient Encounter Medications as of 03/30/2020  Medication Sig  . acetaminophen (TYLENOL) 325 MG tablet Take 2 tablets (650 mg total) by mouth every 6 (six) hours as needed for mild pain (or Fever >/= 101).  Marland Kitchen amLODipine (NORVASC) 10 MG tablet TAKE 1 TABLET BY MOUTH ONCE DAILY  . Ascorbic Acid (VITAMIN C) 1000 MG tablet Take 1,000 mg by mouth daily.  Marland Kitchen aspirin EC 81 MG tablet Take 81 mg by mouth daily.  Marland Kitchen azelastine (ASTELIN) 0.1 % nasal spray Place 2 sprays into both nostrils 2 (two) times daily. Use in each nostril as directed (Patient taking differently: Place 2 sprays into both nostrils daily as needed for rhinitis. )  . benzonatate (TESSALON PERLES) 100 MG capsule Take 1 capsule (100 mg total) by mouth 3 (three) times daily as needed for cough.  . Carboxymethylcellul-Glycerin (LUBRICATING EYE DROPS OP) Apply 1 drop to eye daily as needed (dry eyes).   . cephALEXin (KEFLEX) 500 MG capsule Take 1 capsule (500 mg total) by mouth 2 (two) times daily.  . Cyanocobalamin (B-12 COMPLIANCE INJECTION) 1000 MCG/ML KIT Inject 1,000 mcg as directed every 30 (thirty) days.  Marland Kitchen Fexofenadine HCl (ALLEGRA PO) Take 180 mg by mouth at bedtime.   Marland Kitchen glucose blood (ONETOUCH VERIO) test strip 1 each by Other route as needed for other. Use as instructed to check blood sugar 2 times  per day dx: e11.65  . irbesartan (AVAPRO) 150 MG tablet TAKE 1 TABLET BY MOUTH EVERY DAY. (Patient taking differently: Take 150 mg by mouth daily. )  . Lancets (ONETOUCH ULTRASOFT) lancets Use as instructed to check blood sugar 2 times per day dx code e11.65  . Multiple Vitamins-Minerals (STRESS TAB NF PO) Take 1 tablet by mouth daily.  Marland Kitchen olopatadine (PATANOL) 0.1 % ophthalmic solution Place 1 drop into both eyes 2 (two) times daily.  Marland Kitchen omeprazole (PRILOSEC) 20 MG capsule TAKE 1 CAPSULE BY MOUTH ONCE DAILY 30 MINUTES TO 1 HOUR BEFORE A MEAL  . PAZEO 0.7 % SOLN INSTILL 1 DROP INTO BOTH EYES DAILY. (Patient not taking: Reported on 01/31/2020)  . rosuvastatin (CRESTOR) 20 MG tablet TAKE 1 TABLET BY MOUTH ONCE DAILY  . trolamine salicylate (ASPERCREME) 10 % cream Apply 1 application topically as needed for muscle pain.   No facility-administered encounter medications on file as of 03/30/2020.     Goals Addressed            This Visit's Progress   . High Risk for readmission       Current Barriers:  Marland Kitchen Knowledge Deficits related to disease process and Self health management of Colitis . Chronic Disease Management support and education needs related to Colitis, DM, HTN  Nurse Case Manager Clinical Goal(s):  Marland Kitchen Over the next 45 days, patient will work with the Gastroenterologist to address needs related to evaluation and treatment of Colitis - Goal  Met  . Over the next 90 days, patient will not experience hospital admission. Hospital Admissions in last 6 months = 3 Goal Not Met  . New 02/14/20 Over the next 90 days, patient will not experience hospital admissions and or ED visits. Hospital Admissions/ED visits in last 6 months = 3, ED visits = 2  CCM SW Interventions: Completed 03/30/20 . Successful outbound call placed to the patient to assess goal progression . Determined the patient is feeling well, reports a cough over the last several days . Advised the patient to contact her primary care  provider if cough persists  o Patient stated understanding . Collaboration with RN Care Manager to update on patient goal progression . Scheduled follow up call with RN CM over the next 60 days  CCM RN CM Interventions:  02/14/20 call completed with patient  . Evaluation of current treatment plan related to Infectious Colitis and Cystitis and patient's adherence to plan as established by provider . Determined patient underwent a Colonoscopy procedure on 09/05/19 and was noted to have 7 benign polyps removed from various parts of her intestine . Determined since Ms. Acrey's last ED visit for symptoms suggestive of Colitis and Diverticulitis, she has remained to be asymptomatic and feels she is progressing toward wellness, she denies persistent GI s/s since having her Colonoscopy . Discussed patient is taking a probiotic and avoiding eating foods she feels may trigger Colitis and or Diverticulitis . Discussed most recent ED visit on 01/31/20 indicated patient had a UTI for which she was prescribed an antibiotic  . Determined patient has since f/u with PCP Dr. Glendale Chard, her urinalysis was negative for infection, patient states she has 4 doses of Cephalexin to take in which she plans to do per instructions of Dr. Baird Cancer . Educated on early s/s of UTI and when to call the MD, potential cause for UTI and how to maintain good urinary health . Discussed plans with patient for ongoing care management follow up and provided patient with direct contact information for care management team  Patient Self Care Activities:  . Self administers medications as prescribed . Attends all scheduled provider appointments . Calls pharmacy for medication refills . Performs ADL's independently . Performs IADL's independently . Calls provider office for new concerns or questions  Please see past updates related to this goal by clicking on the "Past Updates" button in the selected goal          Follow Up Plan:  No SW follow up planned at this time. Scheduled RN Care Managerment follow up over the next 60 days.   Daneen Schick, BSW, CDP Social Worker, Certified Dementia Practitioner [place] [facility] [place] [facility] [phone]  Total time spent performing care coordination and/or care management activities with the patient by phone or face to face = 8 minutes.

## 2020-04-02 ENCOUNTER — Other Ambulatory Visit: Payer: Self-pay | Admitting: Internal Medicine

## 2020-04-02 DIAGNOSIS — M25561 Pain in right knee: Secondary | ICD-10-CM | POA: Diagnosis not present

## 2020-04-02 DIAGNOSIS — M160 Bilateral primary osteoarthritis of hip: Secondary | ICD-10-CM | POA: Diagnosis not present

## 2020-04-02 DIAGNOSIS — M25562 Pain in left knee: Secondary | ICD-10-CM | POA: Diagnosis not present

## 2020-04-02 DIAGNOSIS — E785 Hyperlipidemia, unspecified: Secondary | ICD-10-CM | POA: Diagnosis not present

## 2020-04-02 DIAGNOSIS — Z8673 Personal history of transient ischemic attack (TIA), and cerebral infarction without residual deficits: Secondary | ICD-10-CM | POA: Diagnosis not present

## 2020-04-02 DIAGNOSIS — I129 Hypertensive chronic kidney disease with stage 1 through stage 4 chronic kidney disease, or unspecified chronic kidney disease: Secondary | ICD-10-CM | POA: Diagnosis not present

## 2020-04-02 DIAGNOSIS — D1771 Benign lipomatous neoplasm of kidney: Secondary | ICD-10-CM | POA: Diagnosis not present

## 2020-04-02 DIAGNOSIS — N2889 Other specified disorders of kidney and ureter: Secondary | ICD-10-CM | POA: Diagnosis not present

## 2020-04-02 DIAGNOSIS — N183 Chronic kidney disease, stage 3 unspecified: Secondary | ICD-10-CM | POA: Diagnosis not present

## 2020-04-02 DIAGNOSIS — K440 Diaphragmatic hernia with obstruction, without gangrene: Secondary | ICD-10-CM | POA: Diagnosis not present

## 2020-04-02 DIAGNOSIS — E1122 Type 2 diabetes mellitus with diabetic chronic kidney disease: Secondary | ICD-10-CM | POA: Diagnosis not present

## 2020-04-18 ENCOUNTER — Encounter: Payer: Medicare Other | Admitting: Internal Medicine

## 2020-04-18 ENCOUNTER — Ambulatory Visit: Payer: Medicare Other

## 2020-04-23 DIAGNOSIS — I129 Hypertensive chronic kidney disease with stage 1 through stage 4 chronic kidney disease, or unspecified chronic kidney disease: Secondary | ICD-10-CM

## 2020-04-23 DIAGNOSIS — M160 Bilateral primary osteoarthritis of hip: Secondary | ICD-10-CM

## 2020-04-23 DIAGNOSIS — E1122 Type 2 diabetes mellitus with diabetic chronic kidney disease: Secondary | ICD-10-CM

## 2020-04-23 DIAGNOSIS — N183 Chronic kidney disease, stage 3 unspecified: Secondary | ICD-10-CM

## 2020-05-07 ENCOUNTER — Other Ambulatory Visit: Payer: Self-pay

## 2020-05-07 ENCOUNTER — Telehealth: Payer: Medicare Other

## 2020-05-07 ENCOUNTER — Telehealth: Payer: Self-pay

## 2020-05-07 ENCOUNTER — Ambulatory Visit: Payer: Self-pay

## 2020-05-07 DIAGNOSIS — I129 Hypertensive chronic kidney disease with stage 1 through stage 4 chronic kidney disease, or unspecified chronic kidney disease: Secondary | ICD-10-CM

## 2020-05-07 DIAGNOSIS — I10 Essential (primary) hypertension: Secondary | ICD-10-CM

## 2020-05-07 DIAGNOSIS — N183 Chronic kidney disease, stage 3 unspecified: Secondary | ICD-10-CM

## 2020-05-07 DIAGNOSIS — I5189 Other ill-defined heart diseases: Secondary | ICD-10-CM

## 2020-05-07 MED FILL — OMEPRAZOLE DR 20 MG CAPSULE: 20 | 90 days supply | Qty: 90 | Fill #1

## 2020-05-07 NOTE — Telephone Encounter (Cosign Needed)
  Chronic Care Management   Outreach Note  05/07/2020 Name: Anna Hoffman MRN: [id number] DOB: 22-Apr-1935  Referred by: Glendale Chard, MD Reason for referral : Chronic Care Management (CCM RN CM FU Call )   An unsuccessful telephone outreach was attempted today. The patient was referred to the case management team for assistance with care management and care coordination.   Follow Up Plan: A HIPAA compliant phone message was left for the patient providing contact information and requesting a return call.  Telephone follow up appointment with care management team member scheduled for: 06/14/20  Barb Merino, RN, BSN, CCM Care Management Coordinator [place] [facility]  Direct Phone: [phone]

## 2020-05-07 NOTE — Progress Notes (Signed)
This encounter was created in error - please disregard.

## 2020-05-08 ENCOUNTER — Ambulatory Visit (INDEPENDENT_AMBULATORY_CARE_PROVIDER_SITE_OTHER): Payer: Medicare Other

## 2020-05-08 VITALS — BP 130/68 | HR 70 | Temp 98.1°F | Ht 63.0 in | Wt 151.0 lb

## 2020-05-08 DIAGNOSIS — Z23 Encounter for immunization: Secondary | ICD-10-CM

## 2020-05-10 ENCOUNTER — Other Ambulatory Visit: Payer: Self-pay | Admitting: Internal Medicine

## 2020-05-10 DIAGNOSIS — Z1231 Encounter for screening mammogram for malignant neoplasm of breast: Secondary | ICD-10-CM

## 2020-05-10 NOTE — Chronic Care Management (AMB) (Signed)
Chronic Care Management   Follow Up Note   05/07/2020 Name: Anna Hoffman MRN: [id number] DOB: 05/25/1935  Referred by: Glendale Chard, MD Reason for referral : Chronic Care Management (CCM RN CM FU Call )   Anna Hoffman is a 85 y.o. year old female who is a primary care patient of Glendale Chard, MD. The CCM team was consulted for assistance with chronic disease management and care coordination needs.    Review of patient status, including review of consultants reports, relevant laboratory and other test results, and collaboration with appropriate care team members and the patient's provider was performed as part of comprehensive patient evaluation and provision of chronic care management services.    SDOH (Social Determinants of Health) assessments performed: Yes - no acute challenges identified at this time  See Care Plan activities for detailed interventions related to [place])   Placed outbound CCM RN CM Call to patient for a care plan update.     Outpatient Encounter Medications as of 05/07/2020  Medication Sig  . acetaminophen (TYLENOL) 325 MG tablet Take 2 tablets (650 mg total) by mouth every 6 (six) hours as needed for mild pain (or Fever >/= 101).  Marland Kitchen amLODipine (NORVASC) 10 MG tablet TAKE 1 TABLET BY MOUTH ONCE DAILY  . Ascorbic Acid (VITAMIN C) 1000 MG tablet Take 1,000 mg by mouth daily.  Marland Kitchen aspirin EC 81 MG tablet Take 81 mg by mouth daily.  Marland Kitchen azelastine (ASTELIN) 0.1 % nasal spray Place 2 sprays into both nostrils 2 (two) times daily. Use in each nostril as directed (Patient taking differently: Place 2 sprays into both nostrils daily as needed for rhinitis. )  . benzonatate (TESSALON PERLES) 100 MG capsule Take 1 capsule (100 mg total) by mouth 3 (three) times daily as needed for cough.  . Carboxymethylcellul-Glycerin (LUBRICATING EYE DROPS OP) Apply 1 drop to eye daily as needed (dry eyes).   . cephALEXin (KEFLEX) 500 MG capsule Take 1 capsule (500 mg total) by mouth 2  (two) times daily.  . Cyanocobalamin (B-12 COMPLIANCE INJECTION) 1000 MCG/ML KIT Inject 1,000 mcg as directed every 30 (thirty) days.  Marland Kitchen Fexofenadine HCl (ALLEGRA PO) Take 180 mg by mouth at bedtime.   Marland Kitchen glucose blood (ONETOUCH VERIO) test strip 1 each by Other route as needed for other. Use as instructed to check blood sugar 2 times per day dx: e11.65  . irbesartan (AVAPRO) 150 MG tablet TAKE 1 TABLET BY MOUTH EVERY DAY.  Marland Kitchen Lancets (ONETOUCH ULTRASOFT) lancets Use as instructed to check blood sugar 2 times per day dx code e11.65  . Multiple Vitamins-Minerals (STRESS TAB NF PO) Take 1 tablet by mouth daily.  Marland Kitchen olopatadine (PATANOL) 0.1 % ophthalmic solution Place 1 drop into both eyes 2 (two) times daily.  Marland Kitchen omeprazole (PRILOSEC) 20 MG capsule TAKE 1 CAPSULE BY MOUTH ONCE DAILY 30 MINUTES TO 1 HOUR BEFORE A MEAL  . PAZEO 0.7 % SOLN INSTILL 1 DROP INTO BOTH EYES DAILY. (Patient not taking: Reported on 01/31/2020)  . rosuvastatin (CRESTOR) 20 MG tablet TAKE 1 TABLET BY MOUTH ONCE DAILY  . trolamine salicylate (ASPERCREME) 10 % cream Apply 1 application topically as needed for muscle pain.   No facility-administered encounter medications on file as of 05/07/2020.     Objective:  Lab Results  Component Value Date   HGBA1C 6.1 (H) 02/09/2020   HGBA1C 6.3 (H) 11/10/2019   HGBA1C 6.7 (H) 07/11/2019   Lab Results  Component Value Date   LDLCALC  124 (H) 06/29/2016   CREATININE 1.00 03/13/2020   BP Readings from Last 3 Encounters:  05/08/20 130/68  03/13/20 (!) 162/88  02/09/20 (!) 146/80    Goals Addressed      Patient Stated   .  "to eat better and keep my blood sugars good" (pt-stated)   On track     Current Barriers:  Marland Kitchen Knowledge Deficits related to diabetes disease process and Self Health management  . Chronic Disease Management support and education needs related to DM, HTN, Colitis  Nurse Case Manager Clinical Goal(s):  . New 05/07/20 Over the next 180 days, patient will work  with the [facility] and PCP to address needs related to  disease education and support to improve Self health management of DM   CCM RN CM Interventions:  05/07/20 call completed with patient  . Evaluation of current treatment plan related to Diabetes Mellitus and patient's adherence to plan as established by provider. Marland Kitchen Re-educated patient re:  her A1c is down to 6.1; Re-educated on target A1c <5.6 and how to best achieve/maintain this goal including medication and diet adherence and implementing exercise with recommendations of 150 min per week as tolerated and as directed by PCP . Confirmed patient reviewed the printed educational materials related to Diabetes Care Schedule; How to Avoid Complications while living with Diabetes; Grocery Shopping with Diabetes . Re-educated patient, providing rationale, to check cbg daily before meals and record, calling the CCM RN CM and or PCP for findings outside established parameters  . Discussed plans with patient for ongoing care management follow up and provided patient with direct contact information for care management team . Printed educational materials related "Ways to be Water Wise"  Patient Self Care Activities:  . Self administers medications as prescribed . Attends all scheduled provider appointments . Calls pharmacy for medication refills . Performs ADL's independently . Performs IADL's independently . Calls provider office for new concerns or questions  Please see past updates related to this goal by clicking on the "Past Updates" button in the selected goal        Other   .  COMPLETED: High Risk for readmission        Current Barriers:  Marland Kitchen Knowledge Deficits related to disease process and Self health management of Colitis . Chronic Disease Management support and education needs related to Colitis, DM, HTN  Nurse Case Manager Clinical Goal(s):  Marland Kitchen Over the next 45 days, patient will work with the Gastroenterologist to address needs  related to evaluation and treatment of Colitis - Goal Met  . Over the next 90 days, patient will not experience hospital admission. Hospital Admissions in last 6 months = 3 Goal Not Met  . New 02/14/20 Over the next 90 days, patient will not experience hospital admissions and or ED visits. Hospital Admissions/ED visits in last 6 months = 3, ED visits = 2 Goal Met   CCM RN CM Interventions:  05/07/20 call completed with patient  . Evaluation of current treatment plan related to Infectious Colitis and Cystitis and patient's adherence to plan as established by provider . Determined patient has had no further IP or ED visits since 01/31/20 . Re-Educated on early s/s of UTI and when to call the MD, potential cause for UTI and how to maintain good urinary health . Discussed plans with patient for ongoing care management follow up and provided patient with direct contact information for care management team  Patient Self Care Activities:  . Self administers  medications as prescribed . Attends all scheduled provider appointments . Calls pharmacy for medication refills . Performs ADL's independently . Performs IADL's independently . Calls provider office for new concerns or questions  Please see past updates related to this goal by clicking on the "Past Updates" button in the selected goal        Plan:   Telephone follow up appointment with care management team member scheduled for: 06/14/20  Barb Merino, RN, BSN, CCM Care Management Coordinator [facility]  Direct Phone: [phone]

## 2020-05-10 NOTE — Patient Instructions (Signed)
Visit Information  Goals Addressed      Patient Stated   .  "to eat better and keep my blood sugars good" (pt-stated)   On track     Current Barriers:  . Knowledge Deficits related to diabetes disease process and Self Health management  . Chronic Disease Management support and education needs related to DM, HTN, Colitis  Nurse Case Manager Clinical Goal(s):  . New 05/07/20 Over the next 180 days, patient will work with the CCM RNCM and PCP to address needs related to  disease education and support to improve Self health management of DM   CCM RN CM Interventions:  05/07/20 call completed with patient  . Evaluation of current treatment plan related to Diabetes Mellitus and patient's adherence to plan as established by provider. . Re-educated patient re:  her A1c is down to 6.1; Re-educated on target A1c <5.6 and how to best achieve/maintain this goal including medication and diet adherence and implementing exercise with recommendations of 150 min per week as tolerated and as directed by PCP . Confirmed patient reviewed the printed educational materials related to Diabetes Care Schedule; How to Avoid Complications while living with Diabetes; Grocery Shopping with Diabetes . Re-educated patient, providing rationale, to check cbg daily before meals and record, calling the CCM RN CM and or PCP for findings outside established parameters  . Discussed plans with patient for ongoing care management follow up and provided patient with direct contact information for care management team . Printed educational materials related "Ways to be Water Wise"  Patient Self Care Activities:  . Self administers medications as prescribed . Attends all scheduled provider appointments . Calls pharmacy for medication refills . Performs ADL's independently . Performs IADL's independently . Calls provider office for new concerns or questions  Please see past updates related to this goal by clicking on the "Past  Updates" button in the selected goal        Other   .  COMPLETED: High Risk for readmission        Current Barriers:  . Knowledge Deficits related to disease process and Self health management of Colitis . Chronic Disease Management support and education needs related to Colitis, DM, HTN  Nurse Case Manager Clinical Goal(s):  . Over the next 45 days, patient will work with the Gastroenterologist to address needs related to evaluation and treatment of Colitis - Goal Met  . Over the next 90 days, patient will not experience hospital admission. Hospital Admissions in last 6 months = 3 Goal Not Met  . New 02/14/20 Over the next 90 days, patient will not experience hospital admissions and or ED visits. Hospital Admissions/ED visits in last 6 months = 3, ED visits = 2 Goal Met   CCM RN CM Interventions:  05/07/20 call completed with patient  . Evaluation of current treatment plan related to Infectious Colitis and Cystitis and patient's adherence to plan as established by provider . Determined patient has had no further IP or ED visits since 01/31/20 . Re-Educated on early s/s of UTI and when to call the MD, potential cause for UTI and how to maintain good urinary health . Discussed plans with patient for ongoing care management follow up and provided patient with direct contact information for care management team  Patient Self Care Activities:  . Self administers medications as prescribed . Attends all scheduled provider appointments . Calls pharmacy for medication refills . Performs ADL's independently . Performs IADL's independently . Calls provider office for   new concerns or questions  Please see past updates related to this goal by clicking on the "Past Updates" button in the selected goal        Patient verbalizes understanding of instructions provided today.   Telephone follow up appointment with care management team member scheduled for:   , RN, BSN, CCM Care  Management Coordinator [facility]  Direct Phone: [phone]    

## 2020-05-22 MED FILL — ROSUVASTATIN CALCIUM 20 MG: 20 | 90 days supply | Qty: 90 | Fill #1

## 2020-05-22 MED FILL — AMLODIPINE BESYLATE 10 MG T: 10 | 90 days supply | Qty: 90 | Fill #1

## 2020-06-07 ENCOUNTER — Telehealth: Payer: Medicare Other

## 2020-06-07 ENCOUNTER — Telehealth: Payer: Self-pay

## 2020-06-07 DIAGNOSIS — M170 Bilateral primary osteoarthritis of knee: Secondary | ICD-10-CM | POA: Diagnosis not present

## 2020-06-07 NOTE — Telephone Encounter (Signed)
  Chronic Care Management   Outreach Note  06/07/2020 Name: Anna Hoffman MRN: [id number] DOB: 23-Mar-1935  Referred by: Glendale Chard, MD Reason for referral : Care Coordination   An unsuccessful telephone outreach was attempted today. The patient was referred to the case management team for assistance with care management and care coordination.   Follow Up Plan: A HIPAA compliant phone message was left for the patient providing contact information and requesting a return call.  The care management team will reach out to the patient again over the next 28 days.   Daneen Schick, BSW, CDP Social Worker, Certified Dementia Practitioner [facility] [phone]

## 2020-06-12 ENCOUNTER — Ambulatory Visit (INDEPENDENT_AMBULATORY_CARE_PROVIDER_SITE_OTHER): Payer: Medicare Other | Admitting: Internal Medicine

## 2020-06-12 ENCOUNTER — Encounter: Payer: Self-pay | Admitting: Internal Medicine

## 2020-06-12 ENCOUNTER — Other Ambulatory Visit: Payer: Self-pay

## 2020-06-12 VITALS — BP 146/92 | HR 101 | Temp 98.3°F | Ht 62.0 in | Wt 146.6 lb

## 2020-06-12 DIAGNOSIS — F419 Anxiety disorder, unspecified: Secondary | ICD-10-CM | POA: Diagnosis not present

## 2020-06-12 DIAGNOSIS — N183 Chronic kidney disease, stage 3 unspecified: Secondary | ICD-10-CM

## 2020-06-12 DIAGNOSIS — Z0000 Encounter for general adult medical examination without abnormal findings: Secondary | ICD-10-CM

## 2020-06-12 DIAGNOSIS — E1122 Type 2 diabetes mellitus with diabetic chronic kidney disease: Secondary | ICD-10-CM

## 2020-06-12 DIAGNOSIS — I129 Hypertensive chronic kidney disease with stage 1 through stage 4 chronic kidney disease, or unspecified chronic kidney disease: Secondary | ICD-10-CM | POA: Diagnosis not present

## 2020-06-12 LAB — POCT UA - MICROALBUMIN
Creatinine, POC: 200 mg/dL
Microalbumin Ur, POC: 150 mg/L

## 2020-06-12 LAB — POCT URINALYSIS DIPSTICK
Bilirubin, UA: NEGATIVE
Blood, UA: NEGATIVE
Glucose, UA: NEGATIVE
Ketones, UA: NEGATIVE
Nitrite, UA: NEGATIVE
Protein, UA: POSITIVE — AB
Spec Grav, UA: 1.01 (ref 1.010–1.025)
Urobilinogen, UA: 0.2 E.U./dL
pH, UA: 5 (ref 5.0–8.0)

## 2020-06-12 NOTE — Patient Instructions (Signed)
Health Maintenance After Age 83 After age 83, you are at a higher risk for certain long-term diseases and infections as well as injuries from falls. Falls are a major cause of broken bones and head injuries in people who are older than age 83. Getting regular preventive care can help to keep you healthy and well. Preventive care includes getting regular testing and making lifestyle changes as recommended by your health care provider. Talk with your health care provider about:  Which screenings and tests you should have. A screening is a test that checks for a disease when you have no symptoms.  A diet and exercise plan that is right for you. What should I know about screenings and tests to prevent falls? Screening and testing are the best ways to find a health problem early. Early diagnosis and treatment give you the best chance of managing medical conditions that are common after age 83. Certain conditions and lifestyle choices may make you more likely to have a fall. Your health care provider may recommend:  Regular vision checks. Poor vision and conditions such as cataracts can make you more likely to have a fall. If you wear glasses, make sure to get your prescription updated if your vision changes.  Medicine review. Work with your health care provider to regularly review all of the medicines you are taking, including over-the-counter medicines. Ask your health care provider about any side effects that may make you more likely to have a fall. Tell your health care provider if any medicines that you take make you feel dizzy or sleepy.  Osteoporosis screening. Osteoporosis is a condition that causes the bones to get weaker. This can make the bones weak and cause them to break more easily.  Blood pressure screening. Blood pressure changes and medicines to control blood pressure can make you feel dizzy.  Strength and balance checks. Your health care provider may recommend certain tests to check your  strength and balance while standing, walking, or changing positions.  Foot health exam. Foot pain and numbness, as well as not wearing proper footwear, can make you more likely to have a fall.  Depression screening. You may be more likely to have a fall if you have a fear of falling, feel emotionally low, or feel unable to do activities that you used to do.  Alcohol use screening. Using too much alcohol can affect your balance and may make you more likely to have a fall. What actions can I take to lower my risk of falls? General instructions  Talk with your health care provider about your risks for falling. Tell your health care provider if: ? You fall. Be sure to tell your health care provider about all falls, even ones that seem minor. ? You feel dizzy, sleepy, or off-balance.  Take over-the-counter and prescription medicines only as told by your health care provider. These include any supplements.  Eat a healthy diet and maintain a healthy weight. A healthy diet includes low-fat dairy products, low-fat (lean) meats, and fiber from whole grains, beans, and lots of fruits and vegetables. Home safety  Remove any tripping hazards, such as rugs, cords, and clutter.  Install safety equipment such as grab bars in bathrooms and safety rails on stairs.  Keep rooms and walkways well-lit. Activity   Follow a regular exercise program to stay fit. This will help you maintain your balance. Ask your health care provider what types of exercise are appropriate for you.  If you need a cane or   walker, use it as recommended by your health care provider.  Wear supportive shoes that have nonskid soles. Lifestyle  Do not drink alcohol if your health care provider tells you not to drink.  If you drink alcohol, limit how much you have: ? 0-1 drink a day for women. ? 0-2 drinks a day for men.  Be aware of how much alcohol is in your drink. In the [place] one drink equals one typical bottle of beer (12  oz), one-half glass of wine (5 oz), or one shot of hard liquor (1 oz).  Do not use any products that contain nicotine or tobacco, such as cigarettes and e-cigarettes. If you need help quitting, ask your health care provider. Summary  Having a healthy lifestyle and getting preventive care can help to protect your health and wellness after age 83.  Screening and testing are the best way to find a health problem early and help you avoid having a fall. Early diagnosis and treatment give you the best chance for managing medical conditions that are more common for people who are older than age 83.  Falls are a major cause of broken bones and head injuries in people who are older than age 83. Take precautions to prevent a fall at home.  Work with your health care provider to learn what changes you can make to improve your health and wellness and to prevent falls. This information is not intended to replace advice given to you by your health care provider. Make sure you discuss any questions you have with your health care provider. Document Revised: 11/11/2018 Document Reviewed: 06/03/2017 [employer] Patient Education  2020 [employer]  

## 2020-06-12 NOTE — Progress Notes (Signed)
I,Katawbba Wiggins,acting as a Education administrator for Maximino Greenland, MD.,have documented all relevant documentation on the behalf of Maximino Greenland, MD,as directed by  Maximino Greenland, MD while in the presence of Maximino Greenland, MD.  This visit occurred during the SARS-CoV-2 public health emergency.  Safety protocols were in place, including screening questions prior to the visit, additional usage of staff PPE, and extensive cleaning of exam room while observing appropriate contact time as indicated for disinfecting solutions.  Subjective:     Patient ID: Anna Hoffman , female    DOB: 1935-07-07 , 85 y.o.   MRN: [id number]   Chief Complaint  Patient presents with  . Annual Exam  . Diabetes  . Hypertension    HPI  She is here today for a full physical examination. She is no longer followed by GYN. She reports she is feeling "fair". She hasn't gotten quite back to herself since having shingles in the past year.   Diabetes She presents for her follow-up diabetic visit. She has type 2 diabetes mellitus. Hypoglycemia symptoms include nervousness/anxiousness. There are no diabetic associated symptoms. Pertinent negatives for diabetes include no blurred vision and no chest pain. There are no hypoglycemic complications. Risk factors for coronary artery disease include diabetes mellitus, dyslipidemia, hypertension, post-menopausal and sedentary lifestyle. She never participates in exercise. An ACE inhibitor/angiotensin II receptor blocker is being taken. She does not see a podiatrist.Eye exam is current.  Hypertension This is a chronic problem. The current episode started more than 1 year ago. The problem has been gradually improving since onset. The problem is uncontrolled. Pertinent negatives include no blurred vision, chest pain, palpitations or shortness of breath.     Past Medical History:  Diagnosis Date  . Arthritis   . Diabetes mellitus without complication ([place])   . Hypertension   .  Hypokalemia   . Pneumonia   . Shingles   . Stroke [facility])      Family History  Problem Relation Age of Onset  . Alzheimer's disease Mother   . Prostate cancer Father   . Brain cancer Brother   . Brain cancer Brother   . Pancreatic cancer Sister   . Allergic rhinitis Neg Hx   . Asthma Neg Hx   . Eczema Neg Hx   . Urticaria Neg Hx      Current Outpatient Medications:  .  acetaminophen (TYLENOL) 325 MG tablet, Take 2 tablets (650 mg total) by mouth every 6 (six) hours as needed for mild pain (or Fever >/= 101)., Disp: , Rfl:  .  amLODipine (NORVASC) 10 MG tablet, TAKE 1 TABLET BY MOUTH ONCE DAILY, Disp: 90 tablet, Rfl: 1 .  Ascorbic Acid (VITAMIN C) 1000 MG tablet, Take 1,000 mg by mouth daily., Disp: , Rfl:  .  aspirin EC 81 MG tablet, Take 81 mg by mouth daily., Disp: , Rfl:  .  azelastine (ASTELIN) 0.1 % nasal spray, Place 2 sprays into both nostrils 2 (two) times daily. Use in each nostril as directed (Patient taking differently: Place 2 sprays into both nostrils daily as needed for rhinitis. ), Disp: 30 mL, Rfl: 5 .  Carboxymethylcellul-Glycerin (LUBRICATING EYE DROPS OP), Apply 1 drop to eye daily as needed (dry eyes). , Disp: , Rfl:  .  Cyanocobalamin (B-12 COMPLIANCE INJECTION) 1000 MCG/ML KIT, Inject 1,000 mcg as directed every 30 (thirty) days., Disp: , Rfl:  .  Fexofenadine HCl (ALLEGRA PO), Take 180 mg by mouth at bedtime. , Disp: , Rfl:  .  glucose blood (ONETOUCH VERIO) test strip, 1 each by Other route as needed for other. Use as instructed to check blood sugar 2 times per day dx: e11.65, Disp: 100 each, Rfl: 11 .  irbesartan (AVAPRO) 150 MG tablet, TAKE 1 TABLET BY MOUTH EVERY DAY., Disp: 90 tablet, Rfl: 2 .  Lancets (ONETOUCH ULTRASOFT) lancets, Use as instructed to check blood sugar 2 times per day dx code e11.65, Disp: 100 each, Rfl: 12 .  Multiple Vitamins-Minerals (STRESS TAB NF PO), Take 1 tablet by mouth daily., Disp: , Rfl:  .  olopatadine (PATANOL) 0.1 %  ophthalmic solution, Place 1 drop into both eyes 2 (two) times daily., Disp: 5 mL, Rfl: 5 .  omeprazole (PRILOSEC) 20 MG capsule, TAKE 1 CAPSULE BY MOUTH ONCE DAILY 30 MINUTES TO 1 HOUR BEFORE A MEAL, Disp: 90 capsule, Rfl: 1 .  PAZEO 0.7 % SOLN, INSTILL 1 DROP INTO BOTH EYES DAILY., Disp: 2.5 mL, Rfl: 0 .  rosuvastatin (CRESTOR) 20 MG tablet, TAKE 1 TABLET BY MOUTH ONCE DAILY, Disp: 90 tablet, Rfl: 1 .  trolamine salicylate (ASPERCREME) 10 % cream, Apply 1 application topically as needed for muscle pain., Disp: , Rfl:  .  benzonatate (TESSALON PERLES) 100 MG capsule, Take 1 capsule (100 mg total) by mouth 3 (three) times daily as needed for cough. (Patient not taking: Reported on 06/12/2020), Disp: 30 capsule, Rfl: 0 .  meclizine (ANTIVERT) 12.5 MG tablet, Take 1 tablet (12.5 mg total) by mouth 3 (three) times daily as needed for dizziness., Disp: 30 tablet, Rfl: 0   Allergies  Allergen Reactions  . Amoxicillin Diarrhea    Has patient had a PCN reaction causing immediate rash, facial/tongue/throat swelling, SOB or lightheadedness with hypotension: no Has patient had a PCN reaction causing severe rash involving mucus membranes or skin necrosis: no Has patient had a PCN reaction that required hospitalization: no pharmacist consult Has patient had a PCN reaction occurring within the last 10 years: yes If all of the above answers are "NO", then may proceed with Cephalosporin use.   . Ampicillin Rash    - Tolerates Rocephin and Ancef - Remote occurrence; no symptoms of anaphylaxis or severe cutaneous reaction, and no additional medical attention required    . Sulfa Antibiotics Rash      The patient states she uses post menopausal status for birth control. Last LMP was No LMP recorded (lmp unknown). Patient has had a hysterectomy.. Negative for Dysmenorrhea. Negative for: breast discharge, breast lump(s), breast pain and breast self exam. Associated symptoms include abnormal vaginal bleeding.  Pertinent negatives include abnormal bleeding (hematology), anxiety, decreased libido, depression, difficulty falling sleep, dyspareunia, history of infertility, nocturia, sexual dysfunction, sleep disturbances, urinary incontinence, urinary urgency, vaginal discharge and vaginal itching. Diet regular.The patient states her exercise level is  minimal.   . The patient's tobacco use is:  Social History   Tobacco Use  Smoking Status Never Smoker  Smokeless Tobacco Never Used  . She has been exposed to passive smoke. The patient's alcohol use is:  Social History   Substance and Sexual Activity  Alcohol Use No    Review of Systems  Constitutional: Negative.   HENT: Negative.   Eyes: Negative.  Negative for blurred vision.  Respiratory: Negative.  Negative for shortness of breath.   Cardiovascular: Negative.  Negative for chest pain and palpitations.  Endocrine: Negative.   Genitourinary: Negative.   Musculoskeletal: Negative.   Skin: Negative.   Allergic/Immunologic: Negative.   Neurological: Negative.  Hematological: Negative.   Psychiatric/Behavioral: The patient is nervous/anxious.      Today's Vitals   06/12/20 1522  BP: (!) 146/92  Pulse: (!) 101  Temp: 98.3 F (36.8 C)  TempSrc: Oral  Weight: 146 lb 9.6 oz (66.5 kg)  Height: 5' 2" (1.575 m)  PainSc: 0-No pain   Body mass index is 26.81 kg/m.  Wt Readings from Last 3 Encounters:  06/21/20 143 lb (64.9 kg)  06/12/20 146 lb 9.6 oz (66.5 kg)  05/08/20 151 lb (68.5 kg)   Objective:  Physical Exam Vitals and nursing note reviewed.  Constitutional:      Appearance: Normal appearance.  HENT:     Head: Normocephalic and atraumatic.     Right Ear: Tympanic membrane, ear canal and external ear normal.     Left Ear: Tympanic membrane, ear canal and external ear normal.     Nose:     Comments: Deferred, masked    Mouth/Throat:     Comments: Deferred, masked Eyes:     Extraocular Movements: Extraocular movements  intact.     Conjunctiva/sclera: Conjunctivae normal.     Pupils: Pupils are equal, round, and reactive to light.  Cardiovascular:     Rate and Rhythm: Normal rate and regular rhythm.     Pulses: Normal pulses.          Dorsalis pedis pulses are 2+ on the right side and 2+ on the left side.     Heart sounds: Normal heart sounds.  Pulmonary:     Effort: Pulmonary effort is normal.     Breath sounds: Normal breath sounds.  Chest:     Breasts: Tanner Score is 5.        Right: Normal.        Left: Normal.  Abdominal:     General: Abdomen is flat. Bowel sounds are normal.     Palpations: Abdomen is soft.  Genitourinary:    Comments: deferred Musculoskeletal:        General: Normal range of motion.     Cervical back: Normal range of motion and neck supple.  Feet:     Right foot:     Protective Sensation: 5 sites tested. 5 sites sensed.     Skin integrity: Dry skin present.     Toenail Condition: Right toenails are normal.     Left foot:     Protective Sensation: 5 sites tested. 5 sites sensed.     Skin integrity: Dry skin present.     Toenail Condition: Left toenails are normal.  Skin:    General: Skin is warm and dry.  Neurological:     General: No focal deficit present.     Mental Status: She is alert and oriented to person, place, and time.  Psychiatric:        Mood and Affect: Mood normal.        Behavior: Behavior normal.         Assessment And Plan:     1. Routine general medical examination at health care facility Comments: A full exam was performed. Importance of monthly self breast exams was discussed with the patient. PATIENT IS ADVISED TO GET 30-45 MINUTES REGULAR EXERCISE NO LESS THAN FOUR TO FIVE DAYS PER WEEK - BOTH WEIGHTBEARING EXERCISES AND AEROBIC ARE RECOMMENDED.  PATIENT IS ADVISED TO FOLLOW A HEALTHY DIET WITH AT LEAST SIX FRUITS/VEGGIES PER DAY, DECREASE INTAKE OF RED MEAT, AND TO INCREASE FISH INTAKE TO TWO DAYS PER WEEK.  MEATS/FISH SHOULD  NOT BE FRIED,  BAKED OR BROILED IS PREFERABLE.  I SUGGEST WEARING SPF 50 SUNSCREEN ON EXPOSED PARTS AND ESPECIALLY WHEN IN THE DIRECT SUNLIGHT FOR AN EXTENDED PERIOD OF TIME.  PLEASE AVOID FAST FOOD RESTAURANTS AND INCREASE YOUR WATER INTAKE.   2. Type 2 diabetes mellitus with stage 3 chronic kidney disease, without long-term current use of insulin, unspecified whether stage 3a or 3b CKD ([place]) Comments: Diabetic foot exam was performed. I DISCUSSED WITH THE PATIENT AT LENGTH REGARDING THE GOALS OF GLYCEMIC CONTROL AND POSSIBLE LONG-TERM COMPLICATIONS.  I  ALSO STRESSED THE IMPORTANCE OF COMPLIANCE WITH HOME GLUCOSE MONITORING, DIETARY RESTRICTIONS INCLUDING AVOIDANCE OF SUGARY DRINKS/PROCESSED FOODS,  ALONG WITH REGULAR EXERCISE.  I  ALSO STRESSED THE IMPORTANCE OF ANNUAL EYE EXAMS, SELF FOOT CARE AND COMPLIANCE WITH OFFICE VISITS.  - POCT Urinalysis Dipstick (81002) - POCT UA - Microalbumin - Hemoglobin A1c - Lipid panel - BMP8+EGFR  3. Parenchymal renal hypertension, stage 1 through stage 4 or unspecified chronic kidney disease Comments: Chronic, fair control. She is encouraged to aovid adding salt to her foods. EKG performed, NSR w/ first degree AV block. She will rto in four to six months for re-evaluation.  - EKG 12-Lead  4. Anxiety  Chronic. I do think this triggers a lot of her GI symptoms (not currently present). I do not normally condone benzo use in the elderly; however, I do think she may benefit from low dose anxiolytic. Will consider this in the near future. Buspar may be a better starting point.     Patient was given opportunity to ask questions. Patient verbalized understanding of the plan and was able to repeat key elements of the plan. All questions were answered to their satisfaction.   Maximino Greenland, MD   I, Maximino Greenland, MD, have reviewed all documentation for this visit. The documentation on 06/21/20 for the exam, diagnosis, procedures, and orders are all accurate and complete.   THE PATIENT IS ENCOURAGED TO PRACTICE SOCIAL DISTANCING DUE TO THE COVID-19 PANDEMIC.

## 2020-06-14 ENCOUNTER — Telehealth: Payer: Medicare Other

## 2020-06-14 LAB — LIPID PANEL
Chol/HDL Ratio: 1.7 ratio (ref 0.0–4.4)
Cholesterol, Total: 166 mg/dL (ref 100–199)
HDL: 96 mg/dL (ref >39)
LDL Chol Calc (NIH): 35 mg/dL (ref 0–99)
Triglycerides: 238 mg/dL — ABNORMAL HIGH (ref 0–149)
VLDL Cholesterol Cal: 35 mg/dL (ref 5–40)

## 2020-06-14 LAB — BMP8+EGFR
BUN/Creatinine Ratio: 19 (ref 12–28)
BUN: 21 mg/dL (ref 8–27)
CO2: 22 mmol/L (ref 20–29)
Calcium: 9.6 mg/dL (ref 8.7–10.3)
Chloride: 100 mmol/L (ref 96–106)
Creatinine, Ser: 1.08 mg/dL — ABNORMAL HIGH (ref 0.57–1.00)
GFR calc Af Amer: 55 mL/min/{1.73_m2} — ABNORMAL LOW (ref >59)
GFR calc non Af Amer: 48 mL/min/{1.73_m2} — ABNORMAL LOW (ref >59)
Glucose: 144 mg/dL — ABNORMAL HIGH (ref 65–99)
Potassium: 4 mmol/L (ref 3.5–5.2)
Sodium: 139 mmol/L (ref 134–144)

## 2020-06-14 LAB — HEMOGLOBIN A1C
Est. average glucose Bld gHb Est-mCnc: 134 mg/dL
Hgb A1c MFr Bld: 6.3 % — ABNORMAL HIGH (ref 4.8–5.6)

## 2020-06-15 ENCOUNTER — Other Ambulatory Visit: Payer: Self-pay

## 2020-06-15 ENCOUNTER — Ambulatory Visit
Admission: RE | Admit: 2020-06-15 | Discharge: 2020-06-15 | Disposition: A | Discharge status: Home / Self Care | Payer: Medicare Other | Source: Ambulatory Visit | Attending: Internal Medicine | Admitting: Internal Medicine

## 2020-06-15 DIAGNOSIS — Z1231 Encounter for screening mammogram for malignant neoplasm of breast: Secondary | ICD-10-CM

## 2020-06-20 ENCOUNTER — Ambulatory Visit: Payer: Self-pay

## 2020-06-20 ENCOUNTER — Other Ambulatory Visit: Payer: Self-pay

## 2020-06-20 ENCOUNTER — Telehealth: Payer: Medicare Other

## 2020-06-20 DIAGNOSIS — I129 Hypertensive chronic kidney disease with stage 1 through stage 4 chronic kidney disease, or unspecified chronic kidney disease: Secondary | ICD-10-CM

## 2020-06-20 DIAGNOSIS — E1122 Type 2 diabetes mellitus with diabetic chronic kidney disease: Secondary | ICD-10-CM

## 2020-06-20 DIAGNOSIS — I5189 Other ill-defined heart diseases: Secondary | ICD-10-CM

## 2020-06-20 DIAGNOSIS — N183 Chronic kidney disease, stage 3 unspecified: Secondary | ICD-10-CM

## 2020-06-20 DIAGNOSIS — I10 Essential (primary) hypertension: Secondary | ICD-10-CM

## 2020-06-21 ENCOUNTER — Ambulatory Visit (INDEPENDENT_AMBULATORY_CARE_PROVIDER_SITE_OTHER): Payer: Medicare Other | Admitting: Nurse Practitioner

## 2020-06-21 ENCOUNTER — Encounter: Payer: Self-pay | Admitting: Nurse Practitioner

## 2020-06-21 VITALS — BP 158/88 | HR 112 | Temp 98.6°F | Ht 60.0 in | Wt 143.0 lb

## 2020-06-21 DIAGNOSIS — R000 Tachycardia, unspecified: Secondary | ICD-10-CM

## 2020-06-21 DIAGNOSIS — R42 Dizziness and giddiness: Secondary | ICD-10-CM | POA: Diagnosis not present

## 2020-06-21 MED ORDER — MECLIZINE HCL 12.5 MG PO TABS: 12.5000 mg | ORAL_TABLET | Freq: Three times a day (TID) | ORAL | 0 refills | Status: DC | PRN

## 2020-06-21 NOTE — Progress Notes (Signed)
I,Tianna Badgett,acting as a Education administrator for [employer], NP.,have documented all relevant documentation on the behalf of [employer], NP,as directed by  Bary Castilla, NP while in the presence of Bary Castilla, NP.  This visit occurred during the SARS-CoV-2 public health emergency.  Safety protocols were in place, including screening questions prior to the visit, additional usage of staff PPE, and extensive cleaning of exam room while observing appropriate contact time as indicated for disinfecting solutions.  Subjective:     Patient ID: Anna Hoffman , female    DOB: 1935-06-23 , 85 y.o.   MRN: [id number]   Chief Complaint  Patient presents with  . Dizziness    HPI  Patient is here today for dizziness. She states that it just started yesterday. This has happened before as well. Last time when she had it Dr. Baird Cancer gave her medicine which helped her. She gets dizzy when she gets up in the morning but it gets better as she starts to drink water.   Dizziness This is a recurrent problem. The current episode started yesterday. The problem has been gradually worsening. Associated symptoms include headaches and weakness. She has tried rest and drinking (drinking water helps ) for the symptoms.     Past Medical History:  Diagnosis Date  . Arthritis   . Diabetes mellitus without complication ([place])   . Hypertension   . Hypokalemia   . Pneumonia   . Shingles   . Stroke [facility])      Family History  Problem Relation Age of Onset  . Alzheimer's disease Mother   . Prostate cancer Father   . Brain cancer Brother   . Brain cancer Brother   . Pancreatic cancer Sister   . Allergic rhinitis Neg Hx   . Asthma Neg Hx   . Eczema Neg Hx   . Urticaria Neg Hx      Current Outpatient Medications:  .  acetaminophen (TYLENOL) 325 MG tablet, Take 2 tablets (650 mg total) by mouth every 6 (six) hours as needed for mild pain (or Fever >/= 101)., Disp: , Rfl:  .  amLODipine (NORVASC)  10 MG tablet, TAKE 1 TABLET BY MOUTH ONCE DAILY, Disp: 90 tablet, Rfl: 1 .  Ascorbic Acid (VITAMIN C) 1000 MG tablet, Take 1,000 mg by mouth daily., Disp: , Rfl:  .  aspirin EC 81 MG tablet, Take 81 mg by mouth daily., Disp: , Rfl:  .  azelastine (ASTELIN) 0.1 % nasal spray, Place 2 sprays into both nostrils 2 (two) times daily. Use in each nostril as directed (Patient taking differently: Place 2 sprays into both nostrils daily as needed for rhinitis. ), Disp: 30 mL, Rfl: 5 .  benzonatate (TESSALON PERLES) 100 MG capsule, Take 1 capsule (100 mg total) by mouth 3 (three) times daily as needed for cough. (Patient not taking: Reported on 06/12/2020), Disp: 30 capsule, Rfl: 0 .  Carboxymethylcellul-Glycerin (LUBRICATING EYE DROPS OP), Apply 1 drop to eye daily as needed (dry eyes). , Disp: , Rfl:  .  Cyanocobalamin (B-12 COMPLIANCE INJECTION) 1000 MCG/ML KIT, Inject 1,000 mcg as directed every 30 (thirty) days., Disp: , Rfl:  .  Fexofenadine HCl (ALLEGRA PO), Take 180 mg by mouth at bedtime. , Disp: , Rfl:  .  glucose blood (ONETOUCH VERIO) test strip, 1 each by Other route as needed for other. Use as instructed to check blood sugar 2 times per day dx: e11.65, Disp: 100 each, Rfl: 11 .  irbesartan (AVAPRO) 150 MG tablet, TAKE  1 TABLET BY MOUTH EVERY DAY., Disp: 90 tablet, Rfl: 2 .  Lancets (ONETOUCH ULTRASOFT) lancets, Use as instructed to check blood sugar 2 times per day dx code e11.65, Disp: 100 each, Rfl: 12 .  meclizine (ANTIVERT) 12.5 MG tablet, Take 1 tablet (12.5 mg total) by mouth 3 (three) times daily as needed for dizziness., Disp: 30 tablet, Rfl: 0 .  Multiple Vitamins-Minerals (STRESS TAB NF PO), Take 1 tablet by mouth daily., Disp: , Rfl:  .  olopatadine (PATANOL) 0.1 % ophthalmic solution, Place 1 drop into both eyes 2 (two) times daily., Disp: 5 mL, Rfl: 5 .  omeprazole (PRILOSEC) 20 MG capsule, TAKE 1 CAPSULE BY MOUTH ONCE DAILY 30 MINUTES TO 1 HOUR BEFORE A MEAL, Disp: 90 capsule, Rfl:  1 .  PAZEO 0.7 % SOLN, INSTILL 1 DROP INTO BOTH EYES DAILY., Disp: 2.5 mL, Rfl: 0 .  rosuvastatin (CRESTOR) 20 MG tablet, TAKE 1 TABLET BY MOUTH ONCE DAILY, Disp: 90 tablet, Rfl: 1 .  trolamine salicylate (ASPERCREME) 10 % cream, Apply 1 application topically as needed for muscle pain., Disp: , Rfl:    Allergies  Allergen Reactions  . Amoxicillin Diarrhea    Has patient had a PCN reaction causing immediate rash, facial/tongue/throat swelling, SOB or lightheadedness with hypotension: no Has patient had a PCN reaction causing severe rash involving mucus membranes or skin necrosis: no Has patient had a PCN reaction that required hospitalization: no pharmacist consult Has patient had a PCN reaction occurring within the last 10 years: yes If all of the above answers are "NO", then may proceed with Cephalosporin use.   . Ampicillin Rash    - Tolerates Rocephin and Ancef - Remote occurrence; no symptoms of anaphylaxis or severe cutaneous reaction, and no additional medical attention required    . Sulfa Antibiotics Rash     Review of Systems  Constitutional: Negative.   Respiratory: Negative.   Cardiovascular: Negative.   Gastrointestinal: Negative.   Neurological: Positive for dizziness, weakness, light-headedness and headaches.     Today's Vitals   06/21/20 1513 06/21/20 1516 06/21/20 1517  BP: (!) 162/84 (!) 160/82 (!) 158/88  Pulse: (!) 106 (!) 106 (!) 112  Temp: 98.6 F (37 C)    TempSrc: Oral    Weight: 143 lb (64.9 kg)    Height: 5' (1.524 m)     Body mass index is 27.93 kg/m.   Objective:  Physical Exam Constitutional:      Appearance: Normal appearance.  HENT:     Right Ear: Tympanic membrane normal. There is no impacted cerumen.     Left Ear: Tympanic membrane normal. There is no impacted cerumen.  Cardiovascular:     Rate and Rhythm: Regular rhythm. Tachycardia present.     Pulses: Normal pulses.     Heart sounds: Normal heart sounds.  Pulmonary:     Effort:  Pulmonary effort is normal. No respiratory distress.     Breath sounds: Normal breath sounds.  Neurological:     Mental Status: She is alert and oriented to person, place, and time.     Coordination: Romberg sign negative.     Gait: Gait is intact. Gait normal.         Assessment And Plan:     1. Dizziness - meclizine (ANTIVERT) 12.5 MG tablet; Take 1 tablet (12.5 mg total) by mouth 3 (three) times daily as needed for dizziness.  Dispense: 30 tablet; Refill: 0 -this is a recurrent problem  -advised patient to drink  a lot of water and Gatorade and to stay hydrated.   2. Tachycardia - EKG 12-Lead showed SR -first degree a-v block. Rate 95. No changes comparing to last EKG done on 06/12/20 -educated patient if she experiences any chest pain or shortness of breath to go to the nearest ED.  -Advised patient to drink a lot of water and rest as needed.       Patient was given opportunity to ask questions. Patient verbalized understanding of the plan and was able to repeat key elements of the plan. All questions were answered to their satisfaction.  Bary Castilla, NP   I, Bary Castilla, NP, have reviewed all documentation for this visit. The documentation on 06/21/20 for the exam, diagnosis, procedures, and orders are all accurate and complete.  THE PATIENT IS ENCOURAGED TO PRACTICE SOCIAL DISTANCING DUE TO THE COVID-19 PANDEMIC.

## 2020-06-25 NOTE — Patient Instructions (Signed)
Visit Information  Goals Addressed      Patient Stated   .  "to eat better and keep my blood sugars good" (pt-stated)   On track     Current Barriers:  Marland Kitchen Knowledge Deficits related to diabetes disease process and Self Health management  . Chronic Disease Management support and education needs related to DM, HTN, Colitis  Nurse Case Manager Clinical Goal(s):  . New 05/07/20 Over the next 180 days, patient will work with the [place] and PCP to address needs related to  disease education and support to improve Self health management of DM   CCM RN CM Interventions:  06/20/20 call completed with patient  . Evaluation of current treatment plan related to Diabetes Mellitus and patient's adherence to plan as established by provider. Marland Kitchen Re-educated patient re: on current A1c 6.3; Re-educated on target A1c <5.6 and how to best achieve/maintain this goal including medication and diet adherence and implementing exercise with recommendations of 150 min per week as tolerated and as directed by PCP . Confirmed patient reviewed the printed educational materials related to Diabetes Care Schedule; How to Avoid Complications while living with Diabetes; Grocery Shopping with Diabetes . Re-educated patient, providing rationale, to check cbg daily before meals and record, calling the CCM RN CM and or PCP for findings outside established parameters  . Discussed plans with patient for ongoing care management follow up and provided patient with direct contact information for care management team  Patient Self Care Activities:  . Self administers medications as prescribed . Attends all scheduled provider appointments . Calls pharmacy for medication refills . Performs ADL's independently . Performs IADL's independently . Calls provider office for new concerns or questions  Please see past updates related to this goal by clicking on the "Past Updates" button in the selected goal      .  "to get over this  vertigo" (pt-stated)        [place] (see longitudinal plan of care for additional care plan information)  Current Barriers:  Marland Kitchen Knowledge Deficits related to evaluation and treatment of Vertigo . Chronic Disease Management support and education needs related to Type 2 DM with Stage III CKD, HTN, Hypertensive Nephropathy, Diastolic Dysfunction  Nurse Case Manager Clinical Goal(s):  Marland Kitchen Over the next 90 days, patient will work with the CCM team to address needs related to evaluation and treatment of Vertigo  CCM RN CM Interventions:  06/20/20 call completed with patient  . Inter-disciplinary care team collaboration (see longitudinal plan of care) . Evaluation of current treatment plan related to Vertigo and patient's adherence to plan as established by provider. . Provided education to patient re: the importance of staying well hydrated and taking her time with position change to allow for BP adjustment with position changes . Determined patient does not wish to f/u with her PCP at this time; Determined she will contact the PCP for persistent or worsening symptoms  . Discussed plans with patient for ongoing care management follow up and provided patient with direct contact information for care management team  Patient Self Care Activities:  . Attend all scheduled provider appointments . Call pharmacy for medication refills . Attend church or other social activities . Perform ADL's independently . Perform IADL's independently . Call provider office for new concerns or questions  Initial goal documentation     .  "to improve my kidney function" (pt-stated)   Not on track     [place] (see longitudinal  plan of care for additional care plan information)  Current Barriers:  Marland Kitchen Knowledge Deficits related to disease process and Self Health management of stage III CKD  . 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 Inter-disciplinary care team collaboration (see longitudinal plan of care) . Evaluation of current treatment plan related to stage III CKD  and patient's adherence to plan as established by provider. . Provided education to patient re: stages of chronic kidney disease; Educated patient on current GFR and target range; Educated on importance of increasing water intake to help improve renal function; Educated on importance to keep HTN and DM under good control to help prevent further renal decline . Discussed plans with patient for ongoing care management follow up and provided patient with direct contact information for care management team  Patient Self Care Activities:  . Self administers medications as prescribed . Attend all scheduled provider appointments . Call pharmacy for medication refills . Perform ADL's independently . Perform IADL's independently . Call provider office for new concerns or questions  Initial goal documentation       The patient verbalized understanding of instructions, educational materials, and care plan provided today and declined offer to receive copy of patient instructions, educational materials, and care plan.   Telephone follow up appointment with care management team member scheduled for: 07/24/20  Barb Merino, RN, BSN, CCM Care Management Coordinator [place] [facility]  Direct Phone: [phone]

## 2020-06-25 NOTE — Chronic Care Management (AMB) (Signed)
Chronic Care Management   Follow Up Note   06/20/2020 Name: Anna Hoffman MRN: [id number] DOB: 1934/10/30  Referred by: Glendale Chard, MD Reason for referral : Chronic Care Management (RNCM FU Call )   Anna Hoffman is a 85 y.o. year old female who is a primary care patient of Glendale Chard, MD. The CCM team was consulted for assistance with chronic disease management and care coordination needs.    Review of patient status, including review of consultants reports, relevant laboratory and other test results, and collaboration with appropriate care team members and the patient's provider was performed as part of comprehensive patient evaluation and provision of chronic care management services.    SDOH (Social Determinants of Health) assessments performed: Yes - no acute challenges identified  See Care Plan activities for detailed interventions related to [place])   Placed outbound CCM RN CM follow up call to patient for a care plan update.     Outpatient Encounter Medications as of 06/20/2020  Medication Sig  . acetaminophen (TYLENOL) 325 MG tablet Take 2 tablets (650 mg total) by mouth every 6 (six) hours as needed for mild pain (or Fever >/= 101).  Marland Kitchen amLODipine (NORVASC) 10 MG tablet TAKE 1 TABLET BY MOUTH ONCE DAILY  . Ascorbic Acid (VITAMIN C) 1000 MG tablet Take 1,000 mg by mouth daily.  Marland Kitchen aspirin EC 81 MG tablet Take 81 mg by mouth daily.  Marland Kitchen azelastine (ASTELIN) 0.1 % nasal spray Place 2 sprays into both nostrils 2 (two) times daily. Use in each nostril as directed (Patient taking differently: Place 2 sprays into both nostrils daily as needed for rhinitis. )  . benzonatate (TESSALON PERLES) 100 MG capsule Take 1 capsule (100 mg total) by mouth 3 (three) times daily as needed for cough. (Patient not taking: Reported on 06/12/2020)  . Carboxymethylcellul-Glycerin (LUBRICATING EYE DROPS OP) Apply 1 drop to eye daily as needed (dry eyes).   . Cyanocobalamin (B-12 COMPLIANCE  INJECTION) 1000 MCG/ML KIT Inject 1,000 mcg as directed every 30 (thirty) days.  Marland Kitchen Fexofenadine HCl (ALLEGRA PO) Take 180 mg by mouth at bedtime.   Marland Kitchen glucose blood (ONETOUCH VERIO) test strip 1 each by Other route as needed for other. Use as instructed to check blood sugar 2 times per day dx: e11.65  . irbesartan (AVAPRO) 150 MG tablet TAKE 1 TABLET BY MOUTH EVERY DAY.  Marland Kitchen Lancets (ONETOUCH ULTRASOFT) lancets Use as instructed to check blood sugar 2 times per day dx code e11.65  . Multiple Vitamins-Minerals (STRESS TAB NF PO) Take 1 tablet by mouth daily.  Marland Kitchen olopatadine (PATANOL) 0.1 % ophthalmic solution Place 1 drop into both eyes 2 (two) times daily.  Marland Kitchen omeprazole (PRILOSEC) 20 MG capsule TAKE 1 CAPSULE BY MOUTH ONCE DAILY 30 MINUTES TO 1 HOUR BEFORE A MEAL  . PAZEO 0.7 % SOLN INSTILL 1 DROP INTO BOTH EYES DAILY.  . rosuvastatin (CRESTOR) 20 MG tablet TAKE 1 TABLET BY MOUTH ONCE DAILY  . trolamine salicylate (ASPERCREME) 10 % cream Apply 1 application topically as needed for muscle pain.   No facility-administered encounter medications on file as of 06/20/2020.     Objective:  Lab Results  Component Value Date   HGBA1C 6.3 (H) 06/12/2020   HGBA1C 6.1 (H) 02/09/2020   HGBA1C 6.3 (H) 11/10/2019   Lab Results  Component Value Date   MICROALBUR 150 06/12/2020   LDLCALC 35 06/12/2020   CREATININE 1.08 (H) 06/12/2020   BP Readings from Last 3 Encounters:  06/21/20 (!) 158/88  06/12/20 (!) 146/92  05/08/20 130/68    Goals Addressed      Patient Stated   .  "to eat better and keep my blood sugars good" (pt-stated)   On track     Current Barriers:  Marland Kitchen Knowledge Deficits related to diabetes disease process and Self Health management  . Chronic Disease Management support and education needs related to DM, HTN, Colitis  Nurse Case Manager Clinical Goal(s):  . New 05/07/20 Over the next 180 days, patient will work with the [place] and PCP to address needs related to  disease education  and support to improve Self health management of DM   CCM RN CM Interventions:  06/20/20 call completed with patient  . Evaluation of current treatment plan related to Diabetes Mellitus and patient's adherence to plan as established by provider. Marland Kitchen Re-educated patient re: on current A1c 6.3; Re-educated on target A1c <5.6 and how to best achieve/maintain this goal including medication and diet adherence and implementing exercise with recommendations of 150 min per week as tolerated and as directed by PCP . Confirmed patient reviewed the printed educational materials related to Diabetes Care Schedule; How to Avoid Complications while living with Diabetes; Grocery Shopping with Diabetes . Re-educated patient, providing rationale, to check cbg daily before meals and record, calling the CCM RN CM and or PCP for findings outside established parameters  . Discussed plans with patient for ongoing care management follow up and provided patient with direct contact information for care management team  Patient Self Care Activities:  . Self administers medications as prescribed . Attends all scheduled provider appointments . Calls pharmacy for medication refills . Performs ADL's independently . Performs IADL's independently . Calls provider office for new concerns or questions  Please see past updates related to this goal by clicking on the "Past Updates" button in the selected goal      .  "to get over this vertigo" (pt-stated)        [place] (see longitudinal plan of care for additional care plan information)  Current Barriers:  Marland Kitchen Knowledge Deficits related to evaluation and treatment of Vertigo . Chronic Disease Management support and education needs related to Type 2 DM with Stage III CKD, HTN, Hypertensive Nephropathy, Diastolic Dysfunction  Nurse Case Manager Clinical Goal(s):  Marland Kitchen Over the next 90 days, patient will work with the CCM team to address needs related to evaluation and  treatment of Vertigo  CCM RN CM Interventions:  06/20/20 call completed with patient  . Inter-disciplinary care team collaboration (see longitudinal plan of care) . Evaluation of current treatment plan related to Vertigo and patient's adherence to plan as established by provider. . Provided education to patient re: the importance of staying well hydrated and taking her time with position change to allow for BP adjustment with position changes . Determined patient does not wish to f/u with her PCP at this time; Determined she will contact the PCP for persistent or worsening symptoms  . Discussed plans with patient for ongoing care management follow up and provided patient with direct contact information for care management team  Patient Self Care Activities:  . Attend all scheduled provider appointments . Call pharmacy for medication refills . Attend church or other social activities . Perform ADL's independently . Perform IADL's independently . Call provider office for new concerns or questions  Initial goal documentation     .  "to improve my kidney function" (pt-stated)   Not  on track     [place] (see longitudinal plan of care for additional care plan information)  Current Barriers:  Marland Kitchen Knowledge Deficits related to disease process and Self Health management of stage III CKD  . Chronic Disease Management support and education needs related to Type 2 DM with Stage III CKD, HTN, Hypertensive Nephropathy, Diastolic Dysfunction  Nurse Case Manager Clinical Goal(s):  Marland Kitchen Over the next 90 days, patient will work with the CCM team and PCP to address needs related to disease education and support for improved Self Health management of stage III CKD   CCM RN CM Interventions:  06/20/20 call completed with patient  . Inter-disciplinary care team collaboration (see longitudinal plan of care) . Evaluation of current treatment plan related to stage III CKD  and patient's adherence to  plan as established by provider. . Provided education to patient re: stages of chronic kidney disease; Educated patient on current GFR and target range; Educated on importance of increasing water intake to help improve renal function; Educated on importance to keep HTN and DM under good control to help prevent further renal decline . Discussed plans with patient for ongoing care management follow up and provided patient with direct contact information for care management team  Patient Self Care Activities:  . Self administers medications as prescribed . Attend all scheduled provider appointments . Call pharmacy for medication refills . Perform ADL's independently . Perform IADL's independently . Call provider office for new concerns or questions  Initial goal documentation      Plan:   Telephone follow up appointment with care management team member scheduled for: 07/24/20  Barb Merino, RN, BSN, CCM Care Management Coordinator [place] [facility]  Direct Phone: [phone]

## 2020-07-02 ENCOUNTER — Ambulatory Visit: Payer: Medicare Other

## 2020-07-02 DIAGNOSIS — R42 Dizziness and giddiness: Secondary | ICD-10-CM

## 2020-07-02 DIAGNOSIS — I129 Hypertensive chronic kidney disease with stage 1 through stage 4 chronic kidney disease, or unspecified chronic kidney disease: Secondary | ICD-10-CM

## 2020-07-02 DIAGNOSIS — N183 Chronic kidney disease, stage 3 unspecified: Secondary | ICD-10-CM

## 2020-07-02 DIAGNOSIS — I5189 Other ill-defined heart diseases: Secondary | ICD-10-CM

## 2020-07-02 NOTE — Patient Instructions (Signed)
Social Worker Visit Information  Goals we discussed today:  Goals Addressed              This Visit's Progress     Patient Stated   .  "to get over this vertigo" (pt-stated)        CARE PLAN ENTRY (see longitudinal plan of care for additional care plan information)  Current Barriers:  Marland Kitchen Knowledge Deficits related to evaluation and treatment of Vertigo . Chronic Disease Management support and education needs related to Type 2 DM with Stage III CKD, HTN, Hypertensive Nephropathy, Diastolic Dysfunction  Nurse Case Manager Clinical Goal(s):  Marland Kitchen Over the next 90 days, patient will work with the CCM team to address needs related to evaluation and treatment of Vertigo  CCM RN CM Interventions:  06/20/20 call completed with patient  . Inter-disciplinary care team collaboration (see longitudinal plan of care) . Evaluation of current treatment plan related to Vertigo and patient's adherence to plan as established by provider. . Provided education to patient re: the importance of staying well hydrated and taking her time with position change to allow for BP adjustment with position changes . Determined patient does not wish to f/u with her PCP at this time; Determined she will contact the PCP for persistent or worsening symptoms  . Discussed plans with patient for ongoing care management follow up and provided patient with direct contact information for care management team  CCM SW Interventions: Completed 07/02/20 . Successful outbound call placed to the patient to follow up on goal progression . Performed chart review to note patient seen in clinic on 11/18 due to Vertigo and Tachycardia o Patient prescribed Meclizine HCL 12.5 mg TID PRN o Patient advised to go to the ED or call 911 if chest pain occurred . Determined the patient picked up medication and began taking three times daily o Patient reports side effect of diarrhea from Meclizine o Patient states she discontinued Meclizine  Saturday 11/27 and is no longer experiencing diarrhea . Patient reports she continues to feel fatigue with a headache o Patient taking tylenol to alleviate headache symptoms o Patient has increased water intake and is also drinking Gatorade o Patient denies dizziness at this time . Encouraged the patient to contact her provider office if symptoms worsen or fail to improve . Collaboration with RN Care Manager to provide an update on patient symptoms . Scheduled follow up call over the next 5 days  Patient Self Care Activities:  . Attend all scheduled provider appointments . Call pharmacy for medication refills . Attend church or other social activities . Perform ADL's independently . Perform IADL's independently . Call provider office for new concerns or questions  Please see past updates related to this goal by clicking on the "Past Updates" button in the selected goal          Follow Up Plan: SW will follow up with patient by phone over the next 5 days.   Daneen Schick, BSW, CDP Social Worker, Certified Dementia Practitioner [place] [facility] [place] [facility] [phone]

## 2020-07-02 NOTE — Chronic Care Management (AMB) (Signed)
Chronic Care Management    Social Work Follow Up Note  07/02/2020 Name: Anna Hoffman MRN: [id number] DOB: [birth year]  Anna Hoffman is a 85 y.o. year old female who is a primary care patient of Glendale Chard, MD. The CCM team was consulted for assistance with care coordination.   Review of patient status, including review of consultants reports, other relevant assessments, and collaboration with appropriate care team members and the patient's provider was performed as part of comprehensive patient evaluation and provision of chronic care management services.    SDOH (Social Determinants of Health) assessments performed: No    Outpatient Encounter Medications as of 07/02/2020  Medication Sig   acetaminophen (TYLENOL) 325 MG tablet Take 2 tablets (650 mg total) by mouth every 6 (six) hours as needed for mild pain (or Fever >/= 101).   amLODipine (NORVASC) 10 MG tablet TAKE 1 TABLET BY MOUTH ONCE DAILY   Ascorbic Acid (VITAMIN C) 1000 MG tablet Take 1,000 mg by mouth daily.   aspirin EC 81 MG tablet Take 81 mg by mouth daily.   azelastine (ASTELIN) 0.1 % nasal spray Place 2 sprays into both nostrils 2 (two) times daily. Use in each nostril as directed (Patient taking differently: Place 2 sprays into both nostrils daily as needed for rhinitis. )   benzonatate (TESSALON PERLES) 100 MG capsule Take 1 capsule (100 mg total) by mouth 3 (three) times daily as needed for cough. (Patient not taking: Reported on 06/12/2020)   Carboxymethylcellul-Glycerin (LUBRICATING EYE DROPS OP) Apply 1 drop to eye daily as needed (dry eyes).    Cyanocobalamin (B-12 COMPLIANCE INJECTION) 1000 MCG/ML KIT Inject 1,000 mcg as directed every 30 (thirty) days.   Fexofenadine HCl (ALLEGRA PO) Take 180 mg by mouth at bedtime.    glucose blood (ONETOUCH VERIO) test strip 1 each by Other route as needed for other. Use as instructed to check blood sugar 2 times per day dx: e11.65   irbesartan (AVAPRO) 150 MG  tablet TAKE 1 TABLET BY MOUTH EVERY DAY.   Lancets (ONETOUCH ULTRASOFT) lancets Use as instructed to check blood sugar 2 times per day dx code e11.65   meclizine (ANTIVERT) 12.5 MG tablet Take 1 tablet (12.5 mg total) by mouth 3 (three) times daily as needed for dizziness.   Multiple Vitamins-Minerals (STRESS TAB NF PO) Take 1 tablet by mouth daily.   olopatadine (PATANOL) 0.1 % ophthalmic solution Place 1 drop into both eyes 2 (two) times daily.   omeprazole (PRILOSEC) 20 MG capsule TAKE 1 CAPSULE BY MOUTH ONCE DAILY 30 MINUTES TO 1 HOUR BEFORE A MEAL   PAZEO 0.7 % SOLN INSTILL 1 DROP INTO BOTH EYES DAILY.   rosuvastatin (CRESTOR) 20 MG tablet TAKE 1 TABLET BY MOUTH ONCE DAILY   trolamine salicylate (ASPERCREME) 10 % cream Apply 1 application topically as needed for muscle pain.   No facility-administered encounter medications on file as of 07/02/2020.     Goals Addressed              This Visit's Progress     Patient Stated     "to get over this vertigo" (pt-stated)        CARE PLAN ENTRY (see longitudinal plan of care for additional care plan information)  Current Barriers:   Knowledge Deficits related to evaluation and treatment of Vertigo  Chronic Disease Management support and education needs related to Type 2 DM with Stage III CKD, HTN, Hypertensive Nephropathy, Diastolic Dysfunction  Nurse Case Manager Clinical  Goal(s):   Over the next 90 days, patient will work with the CCM team to address needs related to evaluation and treatment of Vertigo  CCM RN CM Interventions:  06/20/20 call completed with patient   Inter-disciplinary care team collaboration (see longitudinal plan of care)  Evaluation of current treatment plan related to Vertigo and patient's adherence to plan as established by provider.  Provided education to patient re: the importance of staying well hydrated and taking her time with position change to allow for BP adjustment with position  changes  Determined patient does not wish to f/u with her PCP at this time; Determined she will contact the PCP for persistent or worsening symptoms   Discussed plans with patient for ongoing care management follow up and provided patient with direct contact information for care management team  CCM SW Interventions: Completed 07/02/20  Successful outbound call placed to the patient to follow up on goal progression  Performed chart review to note patient seen in clinic on 11/18 due to Vertigo and Tachycardia o Patient prescribed Meclizine HCL 12.5 mg TID PRN o Patient advised to go to the ED or call 911 if chest pain occurred  Determined the patient picked up medication and began taking three times daily o Patient reports side effect of diarrhea from Meclizine o Patient states she discontinued Meclizine Saturday 11/27 and is no longer experiencing diarrhea  Patient reports she continues to feel fatigue with a headache o Patient taking tylenol to alleviate headache symptoms o Patient has increased water intake and is also drinking Gatorade o Patient denies dizziness at this time  Encouraged the patient to contact her provider office if symptoms worsen or fail to improve  Collaboration with RN Care Manager to provide an update on patient symptoms  Scheduled follow up call over the next 5 days  Patient Self Care Activities:   Attend all scheduled provider appointments  Call pharmacy for medication refills  Attend church or other social activities  Perform ADL's independently  Perform IADL's independently  Call provider office for new concerns or questions  Please see past updates related to this goal by clicking on the "Past Updates" button in the selected goal          Follow Up Plan: SW will follow up with patient by phone over the next 5 days.   Daneen Schick, BSW, CDP Social Worker, Certified Dementia Practitioner [place] [facility] [place]  [facility] [phone]  Total time spent performing care coordination and/or care management activities with the patient by phone or face to face = 12 minutes.

## 2020-07-06 ENCOUNTER — Ambulatory Visit: Payer: Medicare Other

## 2020-07-06 ENCOUNTER — Telehealth: Payer: Self-pay

## 2020-07-06 DIAGNOSIS — N183 Chronic kidney disease, stage 3 unspecified: Secondary | ICD-10-CM

## 2020-07-06 DIAGNOSIS — E1122 Type 2 diabetes mellitus with diabetic chronic kidney disease: Secondary | ICD-10-CM

## 2020-07-06 NOTE — Chronic Care Management (AMB) (Signed)
Chronic Care Management    Social Work Follow Up Note  07/06/2020 Name: Anna Hoffman MRN: [id number] DOB: [birth year]  Anna Hoffman is a 85 y.o. year old female who is a primary care patient of Glendale Chard, MD. The CCM team was consulted for assistance with care coordination.   Review of patient status, including review of consultants reports, other relevant assessments, and collaboration with appropriate care team members and the patient's provider was performed as part of comprehensive patient evaluation and provision of chronic care management services.    SDOH (Social Determinants of Health) assessments performed: No    Outpatient Encounter Medications as of 07/06/2020  Medication Sig  . acetaminophen (TYLENOL) 325 MG tablet Take 2 tablets (650 mg total) by mouth every 6 (six) hours as needed for mild pain (or Fever >/= 101).  Marland Kitchen amLODipine (NORVASC) 10 MG tablet TAKE 1 TABLET BY MOUTH ONCE DAILY  . Ascorbic Acid (VITAMIN C) 1000 MG tablet Take 1,000 mg by mouth daily.  Marland Kitchen aspirin EC 81 MG tablet Take 81 mg by mouth daily.  Marland Kitchen azelastine (ASTELIN) 0.1 % nasal spray Place 2 sprays into both nostrils 2 (two) times daily. Use in each nostril as directed (Patient taking differently: Place 2 sprays into both nostrils daily as needed for rhinitis. )  . benzonatate (TESSALON PERLES) 100 MG capsule Take 1 capsule (100 mg total) by mouth 3 (three) times daily as needed for cough. (Patient not taking: Reported on 06/12/2020)  . Carboxymethylcellul-Glycerin (LUBRICATING EYE DROPS OP) Apply 1 drop to eye daily as needed (dry eyes).   . Cyanocobalamin (B-12 COMPLIANCE INJECTION) 1000 MCG/ML KIT Inject 1,000 mcg as directed every 30 (thirty) days.  Marland Kitchen Fexofenadine HCl (ALLEGRA PO) Take 180 mg by mouth at bedtime.   Marland Kitchen glucose blood (ONETOUCH VERIO) test strip 1 each by Other route as needed for other. Use as instructed to check blood sugar 2 times per day dx: e11.65  . irbesartan (AVAPRO) 150 MG  tablet TAKE 1 TABLET BY MOUTH EVERY DAY.  Marland Kitchen Lancets (ONETOUCH ULTRASOFT) lancets Use as instructed to check blood sugar 2 times per day dx code e11.65  . meclizine (ANTIVERT) 12.5 MG tablet Take 1 tablet (12.5 mg total) by mouth 3 (three) times daily as needed for dizziness.  . Multiple Vitamins-Minerals (STRESS TAB NF PO) Take 1 tablet by mouth daily.  Marland Kitchen olopatadine (PATANOL) 0.1 % ophthalmic solution Place 1 drop into both eyes 2 (two) times daily.  Marland Kitchen omeprazole (PRILOSEC) 20 MG capsule TAKE 1 CAPSULE BY MOUTH ONCE DAILY 30 MINUTES TO 1 HOUR BEFORE A MEAL  . PAZEO 0.7 % SOLN INSTILL 1 DROP INTO BOTH EYES DAILY.  . rosuvastatin (CRESTOR) 20 MG tablet TAKE 1 TABLET BY MOUTH ONCE DAILY  . trolamine salicylate (ASPERCREME) 10 % cream Apply 1 application topically as needed for muscle pain.   No facility-administered encounter medications on file as of 07/06/2020.     Goals Addressed              This Visit's Progress     Patient Stated   .  "to get over this vertigo" (pt-stated)        CARE PLAN ENTRY (see longitudinal plan of care for additional care plan information)  Current Barriers:  Marland Kitchen Knowledge Deficits related to evaluation and treatment of Vertigo . Chronic Disease Management support and education needs related to Type 2 DM with Stage III CKD, HTN, Hypertensive Nephropathy, Diastolic Dysfunction  Nurse Case Manager Clinical  Goal(s):  Marland Kitchen Over the next 90 days, patient will work with the CCM team to address needs related to evaluation and treatment of Vertigo  CCM RN CM Interventions:  06/20/20 call completed with patient  . Inter-disciplinary care team collaboration (see longitudinal plan of care) . Evaluation of current treatment plan related to Vertigo and patient's adherence to plan as established by provider. . Provided education to patient re: the importance of staying well hydrated and taking her time with position change to allow for BP adjustment with position changes  . Determined patient does not wish to f/u with her PCP at this time; Determined she will contact the PCP for persistent or worsening symptoms  . Discussed plans with patient for ongoing care management follow up and provided patient with direct contact information for care management team  CCM SW Interventions: Completed 07/06/20 . Inbound call received from the patient returning SW call . Determined the patient continues to experience a headache alleviated by Tylenol - no vertigo reported during today's call . Discussed the patient is experiencing intermittent diarrhea o Patient ate a piece of toast, a boiled egg, and ginger ale for breakfast. Patient reports diarrhea following the meal . Encouraged the patient to contact her provider office Monday if she is still experiencing symptoms . Collaboration with Dr. Baird Cancer and [place] to report ongoing symptoms and plan for patient to contact the office for an appointment if it continues  Completed 07/02/20 . Successful outbound call placed to the patient to follow up on goal progression . Performed chart review to note patient seen in clinic on 11/18 due to Vertigo and Tachycardia o Patient prescribed Meclizine HCL 12.5 mg TID PRN o Patient advised to go to the ED or call 911 if chest pain occurred . Determined the patient picked up medication and began taking three times daily o Patient reports side effect of diarrhea from Meclizine o Patient states she discontinued Meclizine Saturday 11/27 and is no longer experiencing diarrhea . Patient reports she continues to feel fatigue with a headache o Patient taking tylenol to alleviate headache symptoms o Patient has increased water intake and is also drinking Gatorade o Patient denies dizziness at this time . Encouraged the patient to contact her provider office if symptoms worsen or fail to improve . Collaboration with RN Care Manager to provide an update on patient symptoms .  Scheduled follow up call over the next 5 days  Patient Self Care Activities:  . Attend all scheduled provider appointments . Call pharmacy for medication refills . Attend church or other social activities . Perform ADL's independently . Perform IADL's independently . Call provider office for new concerns or questions  Please see past updates related to this goal by clicking on the "Past Updates" button in the selected goal          Follow Up Plan: SW will follow up with patient by phone over the next month.   Daneen Schick, BSW, CDP Social Worker, Certified Dementia Practitioner [place] [facility] [place] [facility] [phone]  Total time spent performing care coordination and/or care management activities with the patient by phone or face to face = 10 minutes.

## 2020-07-06 NOTE — Telephone Encounter (Signed)
  Chronic Care Management   Outreach Note  07/06/2020 Name: Anna Hoffman MRN: [id number] DOB: 26-Jul-1935  Referred by: Glendale Chard, MD Reason for referral : Care Coordination   An unsuccessful telephone outreach was attempted today. The patient was referred to the case management team for assistance with care management and care coordination.   Follow Up Plan: A HIPAA compliant phone message was left for the patient providing contact information and requesting a return call.  The care management team will reach out to the patient again over the next 21 days.   Daneen Schick, BSW, CDP Social Worker, Certified Dementia Practitioner [place] [facility] [place] [facility] [phone]

## 2020-07-06 NOTE — Patient Instructions (Signed)
Social Worker Visit Information  Goals we discussed today:  Goals Addressed              This Visit's Progress     Patient Stated   .  "to get over this vertigo" (pt-stated)        CARE PLAN ENTRY (see longitudinal plan of care for additional care plan information)  Current Barriers:  Marland Kitchen Knowledge Deficits related to evaluation and treatment of Vertigo . Chronic Disease Management support and education needs related to Type 2 DM with Stage III CKD, HTN, Hypertensive Nephropathy, Diastolic Dysfunction  Nurse Case Manager Clinical Goal(s):  Marland Kitchen Over the next 90 days, patient will work with the CCM team to address needs related to evaluation and treatment of Vertigo  CCM RN CM Interventions:  06/20/20 call completed with patient  . Inter-disciplinary care team collaboration (see longitudinal plan of care) . Evaluation of current treatment plan related to Vertigo and patient's adherence to plan as established by provider. . Provided education to patient re: the importance of staying well hydrated and taking her time with position change to allow for BP adjustment with position changes . Determined patient does not wish to f/u with her PCP at this time; Determined she will contact the PCP for persistent or worsening symptoms  . Discussed plans with patient for ongoing care management follow up and provided patient with direct contact information for care management team  CCM SW Interventions: Completed 07/06/20 . Inbound call received from the patient returning SW call . Determined the patient continues to experience a headache alleviated by Tylenol - no vertigo reported during today's call . Discussed the patient is experiencing intermittent diarrhea o Patient ate a piece of toast, a boiled egg, and ginger ale for breakfast. Patient reports diarrhea following the meal . Encouraged the patient to contact her provider office Monday if she is still experiencing symptoms . Collaboration  with Dr. Baird Cancer and [place] to report ongoing symptoms and plan for patient to contact the office for an appointment if it continues  Completed 07/02/20 . Successful outbound call placed to the patient to follow up on goal progression . Performed chart review to note patient seen in clinic on 11/18 due to Vertigo and Tachycardia o Patient prescribed Meclizine HCL 12.5 mg TID PRN o Patient advised to go to the ED or call 911 if chest pain occurred . Determined the patient picked up medication and began taking three times daily o Patient reports side effect of diarrhea from Meclizine o Patient states she discontinued Meclizine Saturday 11/27 and is no longer experiencing diarrhea . Patient reports she continues to feel fatigue with a headache o Patient taking tylenol to alleviate headache symptoms o Patient has increased water intake and is also drinking Gatorade o Patient denies dizziness at this time . Encouraged the patient to contact her provider office if symptoms worsen or fail to improve . Collaboration with RN Care Manager to provide an update on patient symptoms . Scheduled follow up call over the next 5 days  Patient Self Care Activities:  . Attend all scheduled provider appointments . Call pharmacy for medication refills . Attend church or other social activities . Perform ADL's independently . Perform IADL's independently . Call provider office for new concerns or questions  Please see past updates related to this goal by clicking on the "Past Updates" button in the selected goal           Follow Up Plan:  SW will follow up with patient by phone over the next month.   Daneen Schick, BSW, CDP Social Worker, Certified Dementia Practitioner [place] [facility] [place] [facility] [phone]

## 2020-07-10 MED FILL — ONE TOUCH VERIO TEST STRIP: 50 days supply | Qty: 100 | Fill #1

## 2020-07-11 ENCOUNTER — Ambulatory Visit (INDEPENDENT_AMBULATORY_CARE_PROVIDER_SITE_OTHER): Payer: Medicare Other | Admitting: Nurse Practitioner

## 2020-07-11 ENCOUNTER — Encounter: Payer: Self-pay | Admitting: Nurse Practitioner

## 2020-07-11 ENCOUNTER — Other Ambulatory Visit: Payer: Self-pay

## 2020-07-11 VITALS — BP 132/78 | HR 69 | Temp 98.2°F | Ht 63.0 in | Wt 142.8 lb

## 2020-07-11 DIAGNOSIS — R195 Other fecal abnormalities: Secondary | ICD-10-CM | POA: Diagnosis not present

## 2020-07-11 NOTE — Progress Notes (Signed)
I,Tianna Badgett,acting as a Education administrator for [employer], NP.,have documented all relevant documentation on the behalf of [employer], NP,as directed by  Bary Castilla, NP while in the presence of Bary Castilla, NP.  This visit occurred during the SARS-CoV-2 public health emergency.  Safety protocols were in place, including screening questions prior to the visit, additional usage of staff PPE, and extensive cleaning of exam room while observing appropriate contact time as indicated for disinfecting solutions.  Subjective:     Patient ID: Anna Hoffman , female    DOB: 12-13-1934 , 85 y.o.   MRN: [id number]   Chief Complaint  Patient presents with  . Diarrhea    HPI  Patient is here today for diarrhea. She is having it 2-3 times a daily. Loose stools not liquid.  Dark Stool She has used imodium OTC and that has helped her. No nausea/no vomiting.       Past Medical History:  Diagnosis Date  . Arthritis   . Diabetes mellitus without complication ([place])   . Hypertension   . Hypokalemia   . Pneumonia   . Shingles   . Stroke [facility])      Family History  Problem Relation Age of Onset  . Alzheimer's disease Mother   . Prostate cancer Father   . Brain cancer Brother   . Brain cancer Brother   . Pancreatic cancer Sister   . Allergic rhinitis Neg Hx   . Asthma Neg Hx   . Eczema Neg Hx   . Urticaria Neg Hx      Current Outpatient Medications:  .  acetaminophen (TYLENOL) 325 MG tablet, Take 2 tablets (650 mg total) by mouth every 6 (six) hours as needed for mild pain (or Fever >/= 101)., Disp: , Rfl:  .  amLODipine (NORVASC) 10 MG tablet, TAKE 1 TABLET BY MOUTH ONCE DAILY, Disp: 90 tablet, Rfl: 1 .  Ascorbic Acid (VITAMIN C) 1000 MG tablet, Take 1,000 mg by mouth daily., Disp: , Rfl:  .  aspirin EC 81 MG tablet, Take 81 mg by mouth daily., Disp: , Rfl:  .  azelastine (ASTELIN) 0.1 % nasal spray, Place 2 sprays into both nostrils 2 (two) times daily. Use in each  nostril as directed (Patient taking differently: Place 2 sprays into both nostrils daily as needed for rhinitis. ), Disp: 30 mL, Rfl: 5 .  benzonatate (TESSALON PERLES) 100 MG capsule, Take 1 capsule (100 mg total) by mouth 3 (three) times daily as needed for cough. (Patient not taking: Reported on 06/12/2020), Disp: 30 capsule, Rfl: 0 .  Carboxymethylcellul-Glycerin (LUBRICATING EYE DROPS OP), Apply 1 drop to eye daily as needed (dry eyes). , Disp: , Rfl:  .  Cyanocobalamin (B-12 COMPLIANCE INJECTION) 1000 MCG/ML KIT, Inject 1,000 mcg as directed every 30 (thirty) days., Disp: , Rfl:  .  Fexofenadine HCl (ALLEGRA PO), Take 180 mg by mouth at bedtime. , Disp: , Rfl:  .  glucose blood (ONETOUCH VERIO) test strip, 1 each by Other route as needed for other. Use as instructed to check blood sugar 2 times per day dx: e11.65, Disp: 100 each, Rfl: 11 .  irbesartan (AVAPRO) 150 MG tablet, TAKE 1 TABLET BY MOUTH EVERY DAY., Disp: 90 tablet, Rfl: 2 .  Lancets (ONETOUCH ULTRASOFT) lancets, Use as instructed to check blood sugar 2 times per day dx code e11.65, Disp: 100 each, Rfl: 12 .  meclizine (ANTIVERT) 12.5 MG tablet, Take 1 tablet (12.5 mg total) by mouth 3 (three) times daily  as needed for dizziness., Disp: 30 tablet, Rfl: 0 .  Multiple Vitamins-Minerals (STRESS TAB NF PO), Take 1 tablet by mouth daily., Disp: , Rfl:  .  olopatadine (PATANOL) 0.1 % ophthalmic solution, Place 1 drop into both eyes 2 (two) times daily., Disp: 5 mL, Rfl: 5 .  omeprazole (PRILOSEC) 20 MG capsule, TAKE 1 CAPSULE BY MOUTH ONCE DAILY 30 MINUTES TO 1 HOUR BEFORE A MEAL, Disp: 90 capsule, Rfl: 1 .  PAZEO 0.7 % SOLN, INSTILL 1 DROP INTO BOTH EYES DAILY., Disp: 2.5 mL, Rfl: 0 .  rosuvastatin (CRESTOR) 20 MG tablet, TAKE 1 TABLET BY MOUTH ONCE DAILY, Disp: 90 tablet, Rfl: 1 .  trolamine salicylate (ASPERCREME) 10 % cream, Apply 1 application topically as needed for muscle pain., Disp: , Rfl:    Allergies  Allergen Reactions  .  Amoxicillin Diarrhea    Has patient had a PCN reaction causing immediate rash, facial/tongue/throat swelling, SOB or lightheadedness with hypotension: no Has patient had a PCN reaction causing severe rash involving mucus membranes or skin necrosis: no Has patient had a PCN reaction that required hospitalization: no pharmacist consult Has patient had a PCN reaction occurring within the last 10 years: yes If all of the above answers are "NO", then may proceed with Cephalosporin use.   . Ampicillin Rash    - Tolerates Rocephin and Ancef - Remote occurrence; no symptoms of anaphylaxis or severe cutaneous reaction, and no additional medical attention required    . Sulfa Antibiotics Rash     Review of Systems  Constitutional: Negative for fever.  Respiratory: Negative for shortness of breath and wheezing.   Gastrointestinal: Positive for abdominal pain and diarrhea. Negative for nausea and vomiting.  Neurological: Negative for dizziness and syncope.     Today's Vitals   07/11/20 1453  BP: 132/78  Pulse: 69  Temp: 98.2 F (36.8 C)  TempSrc: Oral  Weight: 142 lb 12.8 oz (64.8 kg)  Height: _0  (1.6 m)   Body mass index is 25.3 kg/m.  Wt Readings from Last 3 Encounters:  07/11/20 142 lb 12.8 oz (64.8 kg)  06/21/20 143 lb (64.9 kg)  06/12/20 146 lb 9.6 oz (66.5 kg)    Objective:  Physical Exam Cardiovascular:     Rate and Rhythm: Normal rate and regular rhythm.     Pulses: Normal pulses.     Heart sounds: Normal heart sounds.  Pulmonary:     Effort: Pulmonary effort is normal.     Breath sounds: Normal breath sounds. No wheezing.  Abdominal:     General: Bowel sounds are normal.     Palpations: Abdomen is soft.     Tenderness: There is abdominal tenderness. There is no guarding.     Comments: Some tenderness to lower abdomen.   Neurological:     Mental Status: She is alert and oriented to person, place, and time.  Psychiatric:        Mood and Affect: Mood normal.         Behavior: Behavior normal.         Assessment And Plan:     1. Loose Stools  -Educated patient to drink plenty of water  -OTC imodium as needed. -Gave samples of IBgurd.  Instructed to take 2 capsules by mouth before meals (30-90 min). Educated about side-effects.  -Educated patient if symptoms do not improve to call us or go to the ED.   Patient was given opportunity to ask questions. Patient verbalized understanding of the  plan and was able to repeat key elements of the plan. All questions were answered to their satisfaction.  Bary Castilla, NP   I, Bary Castilla, NP, have reviewed all documentation for this visit. The documentation on 07/11/20 for the exam, diagnosis, procedures, and orders are all accurate and complete.  THE PATIENT IS ENCOURAGED TO PRACTICE SOCIAL DISTANCING DUE TO THE COVID-19 PANDEMIC.

## 2020-07-11 NOTE — Patient Instructions (Signed)

## 2020-07-19 DIAGNOSIS — M25512 Pain in left shoulder: Secondary | ICD-10-CM | POA: Diagnosis not present

## 2020-07-20 ENCOUNTER — Telehealth: Payer: Medicare Other

## 2020-07-24 ENCOUNTER — Ambulatory Visit: Payer: Self-pay

## 2020-07-24 ENCOUNTER — Telehealth: Payer: Medicare Other

## 2020-07-24 ENCOUNTER — Other Ambulatory Visit: Payer: Self-pay

## 2020-07-24 DIAGNOSIS — R197 Diarrhea, unspecified: Secondary | ICD-10-CM

## 2020-07-24 DIAGNOSIS — I5189 Other ill-defined heart diseases: Secondary | ICD-10-CM

## 2020-07-24 DIAGNOSIS — R42 Dizziness and giddiness: Secondary | ICD-10-CM

## 2020-07-24 DIAGNOSIS — N183 Chronic kidney disease, stage 3 unspecified: Secondary | ICD-10-CM

## 2020-07-24 DIAGNOSIS — I129 Hypertensive chronic kidney disease with stage 1 through stage 4 chronic kidney disease, or unspecified chronic kidney disease: Secondary | ICD-10-CM

## 2020-07-26 NOTE — Patient Instructions (Signed)
Visit Information  Goals Addressed      Patient Stated   .  "to get over this vertigo" (pt-stated)        CARE PLAN ENTRY (see longitudinal plan of care for additional care plan information)  Current Barriers:  Marland Kitchen Knowledge Deficits related to evaluation and treatment of Vertigo . Chronic Disease Management support and education needs related to Type 2 DM with Stage III CKD, HTN, Hypertensive Nephropathy, Diastolic Dysfunction  Nurse Case Manager Clinical Goal(s):  Marland Kitchen Over the next 90 days, patient will work with the CCM team to address needs related to evaluation and treatment of Vertigo  CCM RN CM Interventions:  07/24/20 call completed with patient  . Inter-disciplinary care team collaboration (see longitudinal plan of care) . Evaluation of current treatment plan related to Vertigo and patient's adherence to plan as established by provider. . Provided education to patient re: the importance of staying well hydrated and taking her time with position change to allow for BP adjustment with position changes . Determined patient scheduled a f/u visit with Dr. Benjamine Mola ENT to further evaluate the Vertigo, although this condition has improved for now . Discussed and reviewed patient's prescribed medication used to treat this condition and Ms. Bajaj states she is taking this med as prescribed with some effectiveness . Discussed plans with patient for ongoing care management follow up and provided patient with direct contact information for care management team  Patient Self Care Activities:  . Attend all scheduled provider appointments . Call pharmacy for medication refills . Attend church or other social activities . Perform ADL's independently . Perform IADL's independently . Call provider office for new concerns or questions  Please see past updates related to this goal by clicking on the "Past Updates" button in the selected goal         The patient verbalized understanding of  instructions, educational materials, and care plan provided today and declined offer to receive copy of patient instructions, educational materials, and care plan.   Telephone follow up appointment with care management team member scheduled for: 09/05/20  Lynne Logan, RN

## 2020-07-26 NOTE — Chronic Care Management (AMB) (Signed)
Chronic Care Management   Follow Up Note   07/24/2020 Name: Anna Hoffman MRN: [id number] DOB: November 08, 1934  Referred by: Anna Chard, MD Reason for referral : Chronic Care Management (RN CM FU Call)   Anna Hoffman is a 85 y.o. year old female who is a primary care patient of Anna Chard, MD. The CCM team was consulted for assistance with chronic disease management and care coordination needs.    Review of patient status, including review of consultants reports, relevant laboratory and other test results, and collaboration with appropriate care team members and the patient's provider was performed as part of comprehensive patient evaluation and provision of chronic care management services.    SDOH (Social Determinants of Health) assessments performed: No See Care Plan activities for detailed interventions related to [facility])   Completed outbound CCM RN CM follow up call with patient.     Outpatient Encounter Medications as of 07/24/2020  Medication Sig  . acetaminophen (TYLENOL) 325 MG tablet Take 2 tablets (650 mg total) by mouth every 6 (six) hours as needed for mild pain (or Fever >/= 101).  Marland Kitchen amLODipine (NORVASC) 10 MG tablet TAKE 1 TABLET BY MOUTH ONCE DAILY  . Ascorbic Acid (VITAMIN C) 1000 MG tablet Take 1,000 mg by mouth daily.  Marland Kitchen aspirin EC 81 MG tablet Take 81 mg by mouth daily.  Marland Kitchen azelastine (ASTELIN) 0.1 % nasal spray Place 2 sprays into both nostrils 2 (two) times daily. Use in each nostril as directed (Patient taking differently: Place 2 sprays into both nostrils daily as needed for rhinitis. )  . benzonatate (TESSALON PERLES) 100 MG capsule Take 1 capsule (100 mg total) by mouth 3 (three) times daily as needed for cough. (Patient not taking: Reported on 06/12/2020)  . Carboxymethylcellul-Glycerin (LUBRICATING EYE DROPS OP) Apply 1 drop to eye daily as needed (dry eyes).   . Cyanocobalamin (B-12 COMPLIANCE INJECTION) 1000 MCG/ML KIT Inject 1,000 mcg as directed every  30 (thirty) days.  Marland Kitchen Fexofenadine HCl (ALLEGRA PO) Take 180 mg by mouth at bedtime.   Marland Kitchen glucose blood (ONETOUCH VERIO) test strip 1 each by Other route as needed for other. Use as instructed to check blood sugar 2 times per day dx: e11.65  . irbesartan (AVAPRO) 150 MG tablet TAKE 1 TABLET BY MOUTH EVERY DAY.  Marland Kitchen Lancets (ONETOUCH ULTRASOFT) lancets Use as instructed to check blood sugar 2 times per day dx code e11.65  . meclizine (ANTIVERT) 12.5 MG tablet Take 1 tablet (12.5 mg total) by mouth 3 (three) times daily as needed for dizziness.  . Multiple Vitamins-Minerals (STRESS TAB NF PO) Take 1 tablet by mouth daily.  Marland Kitchen olopatadine (PATANOL) 0.1 % ophthalmic solution Place 1 drop into both eyes 2 (two) times daily.  Marland Kitchen omeprazole (PRILOSEC) 20 MG capsule TAKE 1 CAPSULE BY MOUTH ONCE DAILY 30 MINUTES TO 1 HOUR BEFORE A MEAL  . PAZEO 0.7 % SOLN INSTILL 1 DROP INTO BOTH EYES DAILY.  . rosuvastatin (CRESTOR) 20 MG tablet TAKE 1 TABLET BY MOUTH ONCE DAILY  . trolamine salicylate (ASPERCREME) 10 % cream Apply 1 application topically as needed for muscle pain.   No facility-administered encounter medications on file as of 07/24/2020.     Objective:  Lab Results  Component Value Date   HGBA1C 6.3 (H) 06/12/2020   HGBA1C 6.1 (H) 02/09/2020   HGBA1C 6.3 (H) 11/10/2019   Lab Results  Component Value Date   MICROALBUR 150 06/12/2020   LDLCALC 35 06/12/2020   CREATININE  1.08 (H) 06/12/2020   BP Readings from Last 3 Encounters:  07/11/20 132/78  06/21/20 (!) 158/88  06/12/20 (!) 146/92    Goals Addressed      Patient Stated   .  "to get over this vertigo" (pt-stated)        [place] (see longitudinal plan of care for additional care plan information)  Current Barriers:  Marland Kitchen Knowledge Deficits related to evaluation and treatment of Vertigo . 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 Inter-disciplinary care team collaboration (see longitudinal plan of care) . Evaluation of current treatment plan related to Vertigo and patient's adherence to plan as established by provider. . Provided education to patient re: the importance of staying well hydrated and taking her time with position change to allow for BP adjustment with position changes . Determined patient scheduled a f/u visit with Dr. Benjamine Mola ENT to further evaluate the Vertigo, although this condition has improved for now . Discussed and reviewed patient's prescribed medication used to treat this condition and [name] states she is taking this med as prescribed with some effectiveness . Discussed plans with patient for ongoing care management follow up and provided patient with direct contact information for care management team  Patient Self Care Activities:  . Attend all scheduled provider appointments . Call pharmacy for medication refills . Attend church or other social activities . Perform ADL's independently . Perform IADL's independently . Call provider office for new concerns or questions  Please see past updates related to this goal by clicking on the "Past Updates" button in the selected goal         Plan:   Telephone follow up appointment with care management team member scheduled for: 09/05/20  Barb Merino, RN, BSN, CCM Care Management Coordinator [place] [facility]  Direct Phone: [phone]

## 2020-08-06 ENCOUNTER — Other Ambulatory Visit: Payer: Self-pay | Admitting: Internal Medicine

## 2020-08-06 MED FILL — OMEPRAZOLE DR 20 MG CAPSULE: 20 | 90 days supply | Qty: 90 | Fill #0

## 2020-08-10 ENCOUNTER — Ambulatory Visit: Payer: Medicare Other

## 2020-08-10 ENCOUNTER — Ambulatory Visit: Payer: Self-pay

## 2020-08-10 DIAGNOSIS — K529 Noninfective gastroenteritis and colitis, unspecified: Secondary | ICD-10-CM

## 2020-08-10 DIAGNOSIS — I129 Hypertensive chronic kidney disease with stage 1 through stage 4 chronic kidney disease, or unspecified chronic kidney disease: Secondary | ICD-10-CM

## 2020-08-10 DIAGNOSIS — N183 Chronic kidney disease, stage 3 unspecified: Secondary | ICD-10-CM

## 2020-08-10 DIAGNOSIS — E1122 Type 2 diabetes mellitus with diabetic chronic kidney disease: Secondary | ICD-10-CM

## 2020-08-10 NOTE — Patient Instructions (Signed)
   Goals we discussed today:  Goals Addressed            This Visit's Progress   . Work with SW to manage care coordination needs       Timeframe:  Long-Range Goal Priority:  Low Start Date:    1.7.22                         Expected End Date:  5.7.22                     Next planned outreach date: 1.19.22  Patient Goals/Self-Care Activities Over the next 20 days, patient will:   - Patient will self administer medications as prescribed Contact primary care provider to obtain an office visit for evaluation of rash Contact SW as needed prior to next scheduled call

## 2020-08-10 NOTE — Chronic Care Management (AMB) (Signed)
Chronic Care Management    Social Work Follow Up Note  08/10/2020 Name: Laquetta Racey MRN: [id number] DOB: [birth year]  Landree Fernholz is a 86 y.o. year old female who is a primary care patient of Glendale Chard, MD. The CCM team was consulted for assistance with care coordination.   Review of patient status, including review of consultants reports, other relevant assessments, and collaboration with appropriate care team members and the patient's provider was performed as part of comprehensive patient evaluation and provision of chronic care management services.    SDOH (Social Determinants of Health) assessments performed: No    Outpatient Encounter Medications as of 08/10/2020  Medication Sig  . acetaminophen (TYLENOL) 325 MG tablet Take 2 tablets (650 mg total) by mouth every 6 (six) hours as needed for mild pain (or Fever >/= 101).  Marland Kitchen amLODipine (NORVASC) 10 MG tablet TAKE 1 TABLET BY MOUTH ONCE DAILY  . Ascorbic Acid (VITAMIN C) 1000 MG tablet Take 1,000 mg by mouth daily.  Marland Kitchen aspirin EC 81 MG tablet Take 81 mg by mouth daily.  Marland Kitchen azelastine (ASTELIN) 0.1 % nasal spray Place 2 sprays into both nostrils 2 (two) times daily. Use in each nostril as directed (Patient taking differently: Place 2 sprays into both nostrils daily as needed for rhinitis. )  . benzonatate (TESSALON PERLES) 100 MG capsule Take 1 capsule (100 mg total) by mouth 3 (three) times daily as needed for cough. (Patient not taking: Reported on 06/12/2020)  . Carboxymethylcellul-Glycerin (LUBRICATING EYE DROPS OP) Apply 1 drop to eye daily as needed (dry eyes).   . Cyanocobalamin (B-12 COMPLIANCE INJECTION) 1000 MCG/ML KIT Inject 1,000 mcg as directed every 30 (thirty) days.  Marland Kitchen Fexofenadine HCl (ALLEGRA PO) Take 180 mg by mouth at bedtime.   Marland Kitchen glucose blood (ONETOUCH VERIO) test strip 1 each by Other route as needed for other. Use as instructed to check blood sugar 2 times per day dx: e11.65  . irbesartan (AVAPRO) 150 MG tablet  TAKE 1 TABLET BY MOUTH EVERY DAY.  Marland Kitchen Lancets (ONETOUCH ULTRASOFT) lancets Use as instructed to check blood sugar 2 times per day dx code e11.65  . meclizine (ANTIVERT) 12.5 MG tablet Take 1 tablet (12.5 mg total) by mouth 3 (three) times daily as needed for dizziness.  . Multiple Vitamins-Minerals (STRESS TAB NF PO) Take 1 tablet by mouth daily.  Marland Kitchen olopatadine (PATANOL) 0.1 % ophthalmic solution Place 1 drop into both eyes 2 (two) times daily.  Marland Kitchen omeprazole (PRILOSEC) 20 MG capsule TAKE 1 CAPSULE BY MOUTH ONCE DAILY 30 MINUTES TO 1 HOUR BEFORE A MEAL  . PAZEO 0.7 % SOLN INSTILL 1 DROP INTO BOTH EYES DAILY.  . rosuvastatin (CRESTOR) 20 MG tablet TAKE 1 TABLET BY MOUTH ONCE DAILY  . trolamine salicylate (ASPERCREME) 10 % cream Apply 1 application topically as needed for muscle pain.   No facility-administered encounter medications on file as of 08/10/2020.     Patient Care Plan: Social Work Care Plan    Problem Identified: Care Coordination     Long-Range Goal: Collaborate with RN Care Manager to perform appropriate assessments to assist with care coordination needs   Start Date: 08/10/2020  Expected End Date: 12/08/2020  This Visit's Progress: On track  Priority: Low  Note:   Current Barriers:  . Chronic conditions including HTN, Colitis, and DM II which put patient at increased risk of hospitalization  Social Work Clinical Goal(s):  Marland Kitchen Over the next 120 days, patient will work with SW  to address concerns related to care coordination  Interventions: . 1:1 collaboration with Glendale Chard, MD regarding development and update of comprehensive plan of care as evidenced by provider attestation and co-signature . Inter-disciplinary care team collaboration (see longitudinal plan of care) . Successful outbound call placed to the patient to assist with care coordination needs . Determined the patient is beginning to feel better with no further complaints of vertigo . Discussed the patient  continues to have a rash on her back that is itchy - patient has been applying neosporin with no relief . Encouraged the patient to contact her primary provider if symptoms do not improve . Collaboration with RN Care Manager to update on patients progress  Patient Goals/Self-Care Activities Over the next 20 days, patient will:   - Patient will self administer medications as prescribed Contact primary care provider to obtain an office visit for evaluation of rash Contact SW as needed prior to next scheduled call  Follow up Plan: SW will follow up with patient by phone over the next 14 days        Follow Up Plan: SW will follow up with patient by phone over the next month   Daneen Schick, BSW, CDP Social Worker, Certified Dementia Practitioner [place] [facility] [place] [facility] [phone]  Total time spent performing care coordination and/or care management activities with the patient by phone or face to face = 16 minutes.

## 2020-08-14 ENCOUNTER — Ambulatory Visit: Payer: Medicare Other | Attending: Internal Medicine

## 2020-08-14 DIAGNOSIS — Z23 Encounter for immunization: Secondary | ICD-10-CM

## 2020-08-14 NOTE — Progress Notes (Signed)
   Covid-19 Vaccination Clinic  Name:  Anna Hoffman    MRN: [id number] DOB: 04/22/1935  08/14/2020  Anna Hoffman was observed post Covid-19 immunization for 15 minutes without incident. She was provided with Vaccine Information Sheet and instruction to access the V-Safe system.   Anna Hoffman was instructed to call 911 with any severe reactions post vaccine: Marland Kitchen Difficulty breathing  . Swelling of face and throat  . A fast heartbeat  . A bad rash all over body  . Dizziness and weakness   Immunizations Administered    Name Date Dose VIS Date Route   Pfizer COVID-19 Vaccine 08/14/2020  2:27 PM 0.3 mL 05/23/2020 Intramuscular   Manufacturer: [place]   Lot: [id number]   [place]: [id number]

## 2020-08-17 ENCOUNTER — Encounter (HOSPITAL_COMMUNITY): Payer: Self-pay

## 2020-08-17 ENCOUNTER — Ambulatory Visit (HOSPITAL_COMMUNITY)
Admission: EM | Admit: 2020-08-17 | Discharge: 2020-08-17 | Disposition: A | Discharge status: Home / Self Care | Payer: Medicare Other | Attending: Emergency Medicine | Admitting: Emergency Medicine

## 2020-08-17 ENCOUNTER — Other Ambulatory Visit (HOSPITAL_COMMUNITY): Payer: Self-pay | Admitting: Emergency Medicine

## 2020-08-17 DIAGNOSIS — R21 Rash and other nonspecific skin eruption: Secondary | ICD-10-CM

## 2020-08-17 DIAGNOSIS — M2550 Pain in unspecified joint: Secondary | ICD-10-CM | POA: Diagnosis not present

## 2020-08-17 DIAGNOSIS — M545 Low back pain, unspecified: Secondary | ICD-10-CM

## 2020-08-17 MED ORDER — TRIAMCINOLONE ACETONIDE 0.1 % EX CREA: 1.0000 "application " | TOPICAL_CREAM | Freq: Two times a day (BID) | CUTANEOUS | 0 refills | Status: DC

## 2020-08-17 MED FILL — TRIAMCINOLONE 0.1% CREAM: 0.1 | 14 days supply | Qty: 30 | Fill #0

## 2020-08-17 NOTE — Discharge Instructions (Addendum)
Use the prescribed cream on your rash.  Follow up with your primary care provider if your symptoms are not improving.    Take Tylenol as needed for discomfort.

## 2020-08-17 NOTE — ED Triage Notes (Signed)
Pt presents with back pain x 4 days.  Pt states she took the booster shot Tuesday. Pt staes she has had a back pain after the 1st and 2nd dose of COVID vaccine. Pt states the pain radiates to her right hip. She states she has a rash on her back and shoulders. Pt states she has had shingles before. Pt states she has been using Neosporin for the rash. Pt states this morning she woke up with a sore throat.

## 2020-08-17 NOTE — ED Provider Notes (Signed)
[place]    CSN: [id number] Arrival date & time: 08/17/20  1209      History   Chief Complaint Chief Complaint  Patient presents with  . Back Pain  . Rash    HPI Anna Hoffman is a 86 y.o. female.   Patient presents with a pruritic nontender rash on her left upper back x4 days.  She also reports joint aches and back pain since having her COVID booster on 08/14/2020; she states she had similar symptoms after the first 2 shots also.  She denies numbness, weakness, fever, chills, shortness of breath, abdominal pain, or other symptoms.  Her medical history includes hypertension, diabetes, CVA, arthritis.  She has a history of shingles but states she is not able to take medication to treat shingles because of GI upset.  The history is provided by the patient and medical records.    Past Medical History:  Diagnosis Date  . Arthritis   . Diabetes mellitus without complication ([place])   . Hypertension   . Hypokalemia   . Pneumonia   . Shingles   . Stroke [facility])     Patient Active Problem List   Diagnosis Date Noted  . Colitis, acute 08/11/2019  . Colitis 07/11/2019  . Loose stools 06/07/2019  . Herpes zoster without complication 41/74/0814  . Other long term (current) drug therapy 08/12/2018  . Chronic renal disease, stage III ([place]) 07/19/2018  . Hypertensive nephropathy 07/19/2018  . Community acquired pneumonia of right upper lobe of lung 11/07/2017  . Hypertensive emergency 07/01/2016  . Thalamic hemorrhage ([place]) 07/01/2016  . 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 Benign essential HTN   . Type 2 diabetes mellitus with stage 3 chronic kidney disease, without long-term current use of insulin ([place])   . Mixed hyperlipidemia   . ICH (intracerebral hemorrhage) ([place]) - hypertensive R thalamic hemorrhage  06/27/2016  . 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 Retroperitoneal bleed 02/08/2016  . Generalized abdominal pain   . Gastroenteritis 02/04/2016  .  Diarrhea 02/04/2016  . Hypokalemia 02/04/2016  . Incarcerated paraesophageal hernia 09/02/2014  . Renal mass, left 09/02/2014  . Acute esophagitis 09/02/2014  . Gastric outlet obstruction 09/02/2014  . Hypertension   . Vomiting 09/01/2014    Past Surgical History:  Procedure Laterality Date  . ABDOMINAL HYSTERECTOMY    . BREAST EXCISIONAL BIOPSY Left   . ESOPHAGOGASTRODUODENOSCOPY N/A 09/01/2014   Procedure: ESOPHAGOGASTRODUODENOSCOPY (EGD);  Surgeon: Lear Ng, MD;  Location: [facility];  Service: Endoscopy;  Laterality: N/A;  . HIATAL HERNIA REPAIR N/A 09/04/2014   Procedure: LAPAROSCOPIC REPAIR OF HIATAL HERNIA;  Surgeon: Excell Seltzer, MD;  Location: [facility];  Service: General;  Laterality: N/A;  With MESH  . IR GENERIC HISTORICAL  04/10/2016   IR US GUIDE VASC ACCESS RIGHT 04/10/2016 Corrie Mckusick, DO [facility]  . IR GENERIC HISTORICAL  04/10/2016   IR ANGIOGRAM SELECTIVE EACH ADDITIONAL VESSEL 04/10/2016 Corrie Mckusick, DO [facility]  . IR GENERIC HISTORICAL  04/10/2016   IR ANGIOGRAM SELECTIVE EACH ADDITIONAL VESSEL 04/10/2016 Corrie Mckusick, DO [facility]  . IR GENERIC HISTORICAL  04/10/2016   IR EMBO TUMOR ORGAN ISCHEMIA INFARCT INC GUIDE ROADMAPPING 04/10/2016 Corrie Mckusick, DO [facility]  . IR GENERIC HISTORICAL  04/10/2016   IR ANGIOGRAM SELECTIVE EACH ADDITIONAL VESSEL 04/10/2016 Corrie Mckusick, DO [facility]  . IR GENERIC HISTORICAL  04/10/2016   IR ANGIOGRAM SELECTIVE EACH ADDITIONAL VESSEL 04/10/2016 Corrie Mckusick, DO [facility]  .  IR GENERIC HISTORICAL  04/10/2016   IR RENAL SELECTIVE  UNI INC S&I MOD SED 04/10/2016 Corrie Mckusick, DO [facility]  . IR GENERIC HISTORICAL  03/06/2016   IR RADIOLOGIST EVAL & MGMT 03/06/2016 Corrie Mckusick, DO [facility]  . IR GENERIC HISTORICAL  03/26/2016   IR RADIOLOGIST EVAL & MGMT 03/26/2016 Corrie Mckusick, DO [facility]  . IR GENERIC HISTORICAL  04/29/2016   IR RADIOLOGIST EVAL & MGMT 04/29/2016 [facility]  . IR  RADIOLOGIST EVAL & MGMT  11/12/2016  . IR RADIOLOGIST EVAL & MGMT  12/16/2017  . KNEE SURGERY    . TONSILLECTOMY      OB History   No obstetric history on file.      Home Medications    Prior to Admission medications   Medication Sig Start Date End Date Taking? Authorizing Provider  triamcinolone (KENALOG) 0.1 % Apply 1 application topically 2 (two) times daily. 08/17/20  Yes Sharion Balloon, NP  acetaminophen (TYLENOL) 325 MG tablet Take 2 tablets (650 mg total) by mouth every 6 (six) hours as needed for mild pain (or Fever >/= 101). 02/08/16   Johnson, Clanford L, MD  amLODipine (NORVASC) 10 MG tablet TAKE 1 TABLET BY MOUTH ONCE DAILY 02/20/20   Glendale Chard, MD  Ascorbic Acid (VITAMIN C) 1000 MG tablet Take 1,000 mg by mouth daily.    [provider]  aspirin EC 81 MG tablet Take 81 mg by mouth daily.    [provider]  azelastine (ASTELIN) 0.1 % nasal spray Place 2 sprays into both nostrils 2 (two) times daily. Use in each nostril as directed Patient taking differently: Place 2 sprays into both nostrils daily as needed for rhinitis.  10/06/18   Kennith Gain, MD  benzonatate (TESSALON PERLES) 100 MG capsule Take 1 capsule (100 mg total) by mouth 3 (three) times daily as needed for cough. Patient not taking: Reported on 06/12/2020 12/10/19   Glendale Chard, MD  Carboxymethylcellul-Glycerin (LUBRICATING EYE DROPS OP) Apply 1 drop to eye daily as needed (dry eyes).     [provider]  Cyanocobalamin (B-12 COMPLIANCE INJECTION) 1000 MCG/ML KIT Inject 1,000 mcg as directed every 30 (thirty) days.    [provider]  Fexofenadine HCl (ALLEGRA PO) Take 180 mg by mouth at bedtime.     [provider]  glucose blood (ONETOUCH VERIO) test strip 1 each by Other route as needed for other. Use as instructed to check blood sugar 2 times per day dx: e11.65 10/19/19   Glendale Chard, MD  irbesartan (AVAPRO) 150 MG tablet TAKE 1 TABLET BY MOUTH EVERY  DAY. 04/02/20   Glendale Chard, MD  Lancets [facility] ULTRASOFT) lancets Use as instructed to check blood sugar 2 times per day dx code e11.65 10/19/19   Glendale Chard, MD  meclizine (ANTIVERT) 12.5 MG tablet Take 1 tablet (12.5 mg total) by mouth 3 (three) times daily as needed for dizziness. 06/21/20   Bary Castilla, NP  Multiple Vitamins-Minerals (STRESS TAB NF PO) Take 1 tablet by mouth daily.    [provider]  olopatadine (PATANOL) 0.1 % ophthalmic solution Place 1 drop into both eyes 2 (two) times daily. 06/15/19   Kennith Gain, MD  omeprazole (PRILOSEC) 20 MG capsule TAKE 1 CAPSULE BY MOUTH ONCE DAILY 30 MINUTES TO 1 HOUR BEFORE A MEAL 08/06/20   Glendale Chard, MD  PAZEO 0.7 % SOLN INSTILL 1 DROP INTO BOTH EYES DAILY. 12/19/19   Prudy Feeler  Mardene Celeste, MD  rosuvastatin (CRESTOR) 20 MG tablet TAKE 1 TABLET BY MOUTH ONCE DAILY 02/20/20   Glendale Chard, MD  trolamine salicylate (ASPERCREME) 10 % cream Apply 1 application topically as needed for muscle pain.    [provider]    Family History Family History  Problem Relation Age of Onset  . Alzheimer's disease Mother   . Prostate cancer Father   . Brain cancer Brother   . Brain cancer Brother   . Pancreatic cancer Sister   . Allergic rhinitis Neg Hx   . Asthma Neg Hx   . Eczema Neg Hx   . Urticaria Neg Hx     Social History Social History   Tobacco Use  . Smoking status: Never Smoker  . Smokeless tobacco: Never Used  Vaping Use  . Vaping Use: Never used  Substance Use Topics  . Alcohol use: No  . Drug use: No     Allergies   Amoxicillin, Ampicillin, and Sulfa antibiotics   Review of Systems Review of Systems  Constitutional: Negative for chills and fever.  HENT: Negative for ear pain and sore throat.   Eyes: Negative for pain and visual disturbance.  Respiratory: Negative for cough and shortness of breath.   Cardiovascular: Negative for chest pain and palpitations.   Gastrointestinal: Negative for abdominal pain and vomiting.  Genitourinary: Negative for dysuria and hematuria.  Musculoskeletal: Positive for arthralgias and back pain.  Skin: Positive for rash. Negative for color change.  Neurological: Negative for seizures and syncope.  All other systems reviewed and are negative.    Physical Exam Triage Vital Signs ED Triage Vitals  Enc Vitals Group     BP      Pulse      Resp      Temp      Temp src      SpO2      Weight      Height      Head Circumference      Peak Flow      Pain Score      Pain Loc      Pain Edu?      Excl. in [place]?    No data found.  Updated Vital Signs BP (!) 147/89 (BP Location: Right Arm)   Pulse 94   Temp 98 F (36.7 C) (Oral)   Resp 12   LMP  (LMP Unknown)   SpO2 98%   Visual Acuity Right Eye Distance:   Left Eye Distance:   Bilateral Distance:    Right Eye Near:   Left Eye Near:    Bilateral Near:     Physical Exam Vitals and nursing note reviewed.  Constitutional:      General: She is not in acute distress.    Appearance: She is well-developed and well-nourished. She is not ill-appearing.  HENT:     Head: Normocephalic and atraumatic.     Mouth/Throat:     Mouth: Mucous membranes are moist.  Eyes:     Conjunctiva/sclera: Conjunctivae normal.  Cardiovascular:     Rate and Rhythm: Normal rate and regular rhythm.     Heart sounds: Normal heart sounds.  Pulmonary:     Effort: Pulmonary effort is normal. No respiratory distress.     Breath sounds: Normal breath sounds.  Abdominal:     Palpations: Abdomen is soft.     Tenderness: There is no abdominal tenderness.  Musculoskeletal:        General: No edema.  Cervical back: Neck supple.  Skin:    General: Skin is warm and dry.     Findings: Rash present.     Comments: Maculopapular rash on left upper back.  Neurological:     General: No focal deficit present.     Mental Status: She is alert and oriented to person, place, and time.      Comments: Ambulatory with cane.  Psychiatric:        Mood and Affect: Mood and affect and mood normal.        Behavior: Behavior normal.      UC Treatments / Results  Labs (all labs ordered are listed, but only abnormal results are displayed) Labs Reviewed - No data to display  EKG   Radiology No results found.  Procedures Procedures (including critical care time)  Medications Ordered in UC Medications - No data to display  Initial Impression / Assessment and Plan / UC Course  I have reviewed the triage vital signs and the nursing notes.  Pertinent labs & imaging results that were available during my care of the patient were reviewed by me and considered in my medical decision making (see chart for details).   Rash, arthralgias, back pain.  Treating rash with triamcinolone cream.  Tylenol as needed for discomfort.  Instructed patient to follow-up with her PCP next week if her symptoms are not improving.  She agrees to plan of care.   Final Clinical Impressions(s) / UC Diagnoses   Final diagnoses:  Rash  Arthralgia, unspecified joint  Acute bilateral low back pain without sciatica     Discharge Instructions     Use the prescribed cream on your rash.  Follow up with your primary care provider if your symptoms are not improving.    Take Tylenol as needed for discomfort.        ED Prescriptions    Medication Sig Dispense Auth. Provider   triamcinolone (KENALOG) 0.1 % Apply 1 application topically 2 (two) times daily. 30 g Sharion Balloon, NP     PDMP not reviewed this encounter.   Sharion Balloon, NP 08/17/20 1423

## 2020-08-22 ENCOUNTER — Telehealth: Payer: Medicare Other

## 2020-08-22 ENCOUNTER — Telehealth: Payer: Self-pay

## 2020-08-22 NOTE — Telephone Encounter (Signed)
  Chronic Care Management   Outreach Note  08/22/2020 Name: Anna Hoffman MRN: [id number] DOB: 16-Dec-1934  Referred by: Glendale Chard, MD Reason for referral : Care Coordination   An unsuccessful telephone outreach was attempted today. The patient was referred to the case management team for assistance with care management and care coordination.   Follow Up Plan: A HIPAA compliant phone message was left for the patient providing contact information and requesting a return call.  The care management team will reach out to the patient again over the next 21 days.   Daneen Schick, BSW, CDP Social Worker, Certified Dementia Practitioner [facility] [phone]

## 2020-08-24 ENCOUNTER — Other Ambulatory Visit: Payer: Self-pay | Admitting: Internal Medicine

## 2020-08-24 MED FILL — AMLODIPINE BESYLATE 10 MG T: 10 | 90 days supply | Qty: 90 | Fill #0

## 2020-08-24 MED FILL — TRIAMCINOLONE 0.1% CREAM: 0.1 | 15 days supply | Qty: 30 | Fill #0

## 2020-08-27 ENCOUNTER — Ambulatory Visit (INDEPENDENT_AMBULATORY_CARE_PROVIDER_SITE_OTHER): Payer: Medicare Other | Admitting: Internal Medicine

## 2020-08-27 ENCOUNTER — Other Ambulatory Visit: Payer: Self-pay

## 2020-08-27 ENCOUNTER — Encounter: Payer: Self-pay | Admitting: Internal Medicine

## 2020-08-27 VITALS — BP 150/76 | HR 110 | Temp 98.5°F | Ht 63.0 in | Wt 140.2 lb

## 2020-08-27 DIAGNOSIS — I129 Hypertensive chronic kidney disease with stage 1 through stage 4 chronic kidney disease, or unspecified chronic kidney disease: Secondary | ICD-10-CM | POA: Diagnosis not present

## 2020-08-27 DIAGNOSIS — R21 Rash and other nonspecific skin eruption: Secondary | ICD-10-CM | POA: Diagnosis not present

## 2020-08-27 DIAGNOSIS — B029 Zoster without complications: Secondary | ICD-10-CM

## 2020-08-27 DIAGNOSIS — N183 Chronic kidney disease, stage 3 unspecified: Secondary | ICD-10-CM | POA: Diagnosis not present

## 2020-08-27 MED ORDER — TRIAMCINOLONE ACETONIDE 40 MG/ML IJ SUSP
40.0000 mg | Freq: Once | INTRAMUSCULAR | Status: AC
  Administered 2020-08-27: 40 mg via INTRAMUSCULAR

## 2020-08-27 NOTE — Patient Instructions (Signed)
Shingles  Shingles is an infection. It gives you a painful skin rash and blisters that have fluid in them. Shingles is caused by the same germ (virus) that causes chickenpox. Shingles only happens in people who:  Have had chickenpox.  Have been given a shot of medicine (vaccine) to protect against chickenpox. Shingles is rare in this group. The first symptoms of shingles may be itching, tingling, or pain in an area on your skin. A rash will show on your skin a few days or weeks later. The rash is likely to be on one side of your body. The rash usually has a shape like a belt or a band. Over time, the rash turns into fluid-filled blisters. The blisters will break open, change into scabs, and dry up. Medicines may:  Help with pain and itching.  Help you get better sooner.  Help to prevent long-term problems. Follow these instructions at home: Medicines  Take over-the-counter and prescription medicines only as told by your doctor.  Put on an anti-itch cream or numbing cream where you have a rash, blisters, or scabs. Do this as told by your doctor. Helping with itching and discomfort  Put cold, wet cloths (cold compresses) on the area of the rash or blisters as told by your doctor.  Cool baths can help you feel better. Try adding baking soda or dry oatmeal to the water to lessen itching. Do not bathe in hot water.   Blister and rash care  Keep your rash covered with a loose bandage (dressing).  Wear loose clothing that does not rub on your rash.  Keep your rash and blisters clean. To do this, wash the area with mild soap and cool water as told by your doctor.  Check your rash every day for signs of infection. Check for: ? More redness, swelling, or pain. ? Fluid or blood. ? Warmth. ? Pus or a bad smell.  Do not scratch your rash. Do not pick at your blisters. To help you to not scratch: ? Keep your fingernails clean and cut short. ? Wear gloves or mittens when you sleep, if  scratching is a problem. General instructions  Rest as told by your doctor.  Keep all follow-up visits as told by your doctor. This is important.  Wash your hands often with soap and water. If soap and water are not available, use hand sanitizer. Doing this lowers your chance of getting a skin infection caused by germs (bacteria).  Your infection can cause chickenpox in people who have never had chickenpox or never got a shot of chickenpox vaccine. If you have blisters that did not change into scabs yet, try not to touch other people or be around other people, especially: ? Babies. ? Pregnant women. ? Children who have areas of red, itchy, or rough skin (eczema). ? Very old people who have transplants. ? People who have a long-term (chronic) sickness, like cancer or AIDS. Contact a doctor if:  Your pain does not get better with medicine.  Your pain does not get better after the rash heals.  You have any signs of infection in the rash area. These signs include: ? More redness, swelling, or pain around the rash. ? Fluid or blood coming from the rash. ? The rash area feeling warm to the touch. ? Pus or a bad smell coming from the rash. Get help right away if:  The rash is on your face or nose.  You have pain in your face or pain  by your eye.  You lose feeling on one side of your face.  You have trouble seeing.  You have ear pain, or you have ringing in your ear.  You have a loss of taste.  Your condition gets worse. Summary  Shingles gives you a painful skin rash and blisters that have fluid in them.  Shingles is an infection. It is caused by the same germ (virus) that causes chickenpox.  Keep your rash covered with a loose bandage (dressing). Wear loose clothing that does not rub on your rash.  If you have blisters that did not change into scabs yet, try not to touch other people or be around people. This information is not intended to replace advice given to you by  your health care provider. Make sure you discuss any questions you have with your health care provider. Document Revised: 11/12/2018 Document Reviewed: 03/25/2017 Elsevier Patient Education  2021 [employer].

## 2020-08-27 NOTE — Progress Notes (Signed)
I,Katawbba Wiggins,acting as a Education administrator for Maximino Greenland, MD.,have documented all relevant documentation on the behalf of Maximino Greenland, MD,as directed by  Maximino Greenland, MD while in the presence of Maximino Greenland, MD.  This visit occurred during the SARS-CoV-2 public health emergency.  Safety protocols were in place, including screening questions prior to the visit, additional usage of staff PPE, and extensive cleaning of exam room while observing appropriate contact time as indicated for disinfecting solutions.  Subjective:     Patient ID: Anna Hoffman , female    DOB: 11/21/1934 , 86 y.o.   MRN: [id number]   Chief Complaint  Patient presents with  . Herpes Zoster    HPI  The patient is here today for evaluation of shingles. She states she was seen at Urgent Care 08/17/20 for evaluation of a rash. She states she was told she had shingles. She was not given any medication because she has not tolerated shingles medication in the past. She states the rash was on her back, area was initially painful, but this has resolved.     Past Medical History:  Diagnosis Date  . Arthritis   . Diabetes mellitus without complication ([place])   . Hypertension   . Hypokalemia   . Pneumonia   . Shingles   . Stroke [facility])      Family History  Problem Relation Age of Onset  . Alzheimer's disease Mother   . Prostate cancer Father   . Brain cancer Brother   . Brain cancer Brother   . Pancreatic cancer Sister   . Allergic rhinitis Neg Hx   . Asthma Neg Hx   . Eczema Neg Hx   . Urticaria Neg Hx      Current Outpatient Medications:  .  acetaminophen (TYLENOL) 325 MG tablet, Take 2 tablets (650 mg total) by mouth every 6 (six) hours as needed for mild pain (or Fever >/= 101)., Disp: , Rfl:  .  amLODipine (NORVASC) 10 MG tablet, TAKE 1 TABLET BY MOUTH ONCE DAILY, Disp: 90 tablet, Rfl: 1 .  Ascorbic Acid (VITAMIN C) 1000 MG tablet, Take 1,000 mg by mouth daily., Disp: , Rfl:  .  aspirin EC 81  MG tablet, Take 81 mg by mouth daily., Disp: , Rfl:  .  azelastine (ASTELIN) 0.1 % nasal spray, Place 2 sprays into both nostrils 2 (two) times daily. Use in each nostril as directed (Patient taking differently: Place 2 sprays into both nostrils daily as needed for rhinitis.), Disp: 30 mL, Rfl: 5 .  Carboxymethylcellul-Glycerin (LUBRICATING EYE DROPS OP), Apply 1 drop to eye daily as needed (dry eyes). , Disp: , Rfl:  .  Cyanocobalamin (B-12 COMPLIANCE INJECTION) 1000 MCG/ML KIT, Inject 1,000 mcg as directed every 30 (thirty) days., Disp: , Rfl:  .  Fexofenadine HCl (ALLEGRA PO), Take 180 mg by mouth at bedtime. , Disp: , Rfl:  .  glucose blood (ONETOUCH VERIO) test strip, 1 each by Other route as needed for other. Use as instructed to check blood sugar 2 times per day dx: e11.65, Disp: 100 each, Rfl: 11 .  irbesartan (AVAPRO) 150 MG tablet, TAKE 1 TABLET BY MOUTH EVERY DAY., Disp: 90 tablet, Rfl: 2 .  Lancets (ONETOUCH ULTRASOFT) lancets, Use as instructed to check blood sugar 2 times per day dx code e11.65, Disp: 100 each, Rfl: 12 .  meclizine (ANTIVERT) 12.5 MG tablet, Take 1 tablet (12.5 mg total) by mouth 3 (three) times daily as needed for dizziness.,  Disp: 30 tablet, Rfl: 0 .  Multiple Vitamins-Minerals (STRESS TAB NF PO), Take 1 tablet by mouth daily., Disp: , Rfl:  .  olopatadine (PATANOL) 0.1 % ophthalmic solution, Place 1 drop into both eyes 2 (two) times daily., Disp: 5 mL, Rfl: 5 .  omeprazole (PRILOSEC) 20 MG capsule, TAKE 1 CAPSULE BY MOUTH ONCE DAILY 30 MINUTES TO 1 HOUR BEFORE A MEAL, Disp: 90 capsule, Rfl: 1 .  PAZEO 0.7 % SOLN, INSTILL 1 DROP INTO BOTH EYES DAILY., Disp: 2.5 mL, Rfl: 0 .  triamcinolone (KENALOG) 0.1 %, APPLY TO THE AFFECTED AREA(S) 2 TIMES DAILY, Disp: 30 g, Rfl: 0 .  trolamine salicylate (ASPERCREME) 10 % cream, Apply 1 application topically as needed for muscle pain., Disp: , Rfl:  .  benzonatate (TESSALON PERLES) 100 MG capsule, Take 1 capsule (100 mg total) by  mouth 3 (three) times daily as needed for cough. (Patient not taking: No sig reported), Disp: 30 capsule, Rfl: 0 .  rosuvastatin (CRESTOR) 20 MG tablet, TAKE 1 TABLET BY MOUTH ONCE DAILY, Disp: 90 tablet, Rfl: 1   Allergies  Allergen Reactions  . Amoxicillin Diarrhea    Has patient had a PCN reaction causing immediate rash, facial/tongue/throat swelling, SOB or lightheadedness with hypotension: no Has patient had a PCN reaction causing severe rash involving mucus membranes or skin necrosis: no Has patient had a PCN reaction that required hospitalization: no pharmacist consult Has patient had a PCN reaction occurring within the last 10 years: yes If all of the above answers are "NO", then may proceed with Cephalosporin use.   . Ampicillin Rash    - Tolerates Rocephin and Ancef - Remote occurrence; no symptoms of anaphylaxis or severe cutaneous reaction, and no additional medical attention required    . Sulfa Antibiotics Rash     Review of Systems  Constitutional: Negative.   Respiratory: Negative.   Cardiovascular: Negative.   Gastrointestinal: Negative.   Skin: Positive for rash (lower part of back).  Psychiatric/Behavioral: Negative.   All other systems reviewed and are negative.    Today's Vitals   08/27/20 1056  BP: (!) 150/76  Pulse: (!) 110  Temp: 98.5 F (36.9 C)  TempSrc: Oral  Weight: 140 lb 3.2 oz (63.6 kg)  Height: _0  (1.6 m)  PainSc: 7   PainLoc: Back   Body mass index is 24.84 kg/m.  Wt Readings from Last 3 Encounters:  08/27/20 140 lb 3.2 oz (63.6 kg)  07/11/20 142 lb 12.8 oz (64.8 kg)  06/21/20 143 lb (64.9 kg)   Objective:  Physical Exam Vitals and nursing note reviewed.  Constitutional:      Appearance: Normal appearance.  HENT:     Head: Normocephalic and atraumatic.  Cardiovascular:     Rate and Rhythm: Normal rate and regular rhythm.     Heart sounds: Normal heart sounds.  Pulmonary:     Breath sounds: Normal breath sounds.  Skin:     General: Skin is warm.     Comments: Healed rash with many scratch marks on right upper back. No vesicular lesions noted.   Neurological:     General: No focal deficit present.     Mental Status: She is alert and oriented to person, place, and time.         Assessment And Plan:     1. Rash and nonspecific skin eruption Comments: UC note reviewed. Rash described as maculopapular, not sure this was shingles. No need for further treatment at this time.  -  triamcinolone acetonide (KENALOG-40) injection 40 mg  2. Hypertensive nephropathy Comments: Her BP is uncontrolled today. She admits she did not take her meds today. Reports she was rushing so she could get here on time. Encouraged to take meds daily as prescribed.   3. Stage 3 chronic kidney disease, unspecified whether stage 3a or 3b CKD ([place]) Comments: Chronic, this has been stable. She is reminded to stay well hydrated. - Protein electrophoresis, serum - Parathyroid Hormone, Intact w/Ca - Phosphorus   Patient was given opportunity to ask questions. Patient verbalized understanding of the plan and was able to repeat key elements of the plan. All questions were answered to their satisfaction.  Maximino Greenland, MD   I, Maximino Greenland, MD, have reviewed all documentation for this visit. The documentation on 09/06/20 for the exam, diagnosis, procedures, and orders are all accurate and complete.  THE PATIENT IS ENCOURAGED TO PRACTICE SOCIAL DISTANCING DUE TO THE COVID-19 PANDEMIC.

## 2020-08-28 LAB — PROTEIN ELECTROPHORESIS, SERUM
A/G Ratio: 1.2 (ref 0.7–1.7)
Albumin ELP: 4.1 g/dL (ref 2.9–4.4)
Alpha 1: 0.3 g/dL (ref 0.0–0.4)
Alpha 2: 1.1 g/dL — ABNORMAL HIGH (ref 0.4–1.0)
Beta: 1.2 g/dL (ref 0.7–1.3)
Gamma Globulin: 0.9 g/dL (ref 0.4–1.8)
Globulin, Total: 3.4 g/dL (ref 2.2–3.9)
Total Protein: 7.5 g/dL (ref 6.0–8.5)

## 2020-08-28 LAB — PTH, INTACT AND CALCIUM
Calcium: 10 mg/dL (ref 8.7–10.3)
PTH: 86 pg/mL — ABNORMAL HIGH (ref 15–65)

## 2020-08-28 LAB — PHOSPHORUS: Phosphorus: 4.3 mg/dL (ref 3.0–4.3)

## 2020-08-31 ENCOUNTER — Other Ambulatory Visit: Payer: Self-pay | Admitting: Internal Medicine

## 2020-08-31 MED FILL — ROSUVASTATIN CALCIUM 20 MG: 20 | 90 days supply | Qty: 90 | Fill #0

## 2020-09-05 ENCOUNTER — Ambulatory Visit (INDEPENDENT_AMBULATORY_CARE_PROVIDER_SITE_OTHER): Payer: Medicare Other

## 2020-09-05 ENCOUNTER — Telehealth: Payer: Medicare Other

## 2020-09-05 DIAGNOSIS — E1122 Type 2 diabetes mellitus with diabetic chronic kidney disease: Secondary | ICD-10-CM

## 2020-09-05 DIAGNOSIS — N183 Chronic kidney disease, stage 3 unspecified: Secondary | ICD-10-CM

## 2020-09-05 DIAGNOSIS — I129 Hypertensive chronic kidney disease with stage 1 through stage 4 chronic kidney disease, or unspecified chronic kidney disease: Secondary | ICD-10-CM

## 2020-09-05 DIAGNOSIS — I5189 Other ill-defined heart diseases: Secondary | ICD-10-CM

## 2020-09-10 ENCOUNTER — Ambulatory Visit: Payer: Medicare Other

## 2020-09-10 DIAGNOSIS — N183 Chronic kidney disease, stage 3 unspecified: Secondary | ICD-10-CM | POA: Diagnosis not present

## 2020-09-10 DIAGNOSIS — I129 Hypertensive chronic kidney disease with stage 1 through stage 4 chronic kidney disease, or unspecified chronic kidney disease: Secondary | ICD-10-CM

## 2020-09-10 DIAGNOSIS — E1122 Type 2 diabetes mellitus with diabetic chronic kidney disease: Secondary | ICD-10-CM

## 2020-09-10 NOTE — Chronic Care Management (AMB) (Signed)
Chronic Care Management    Social Work Note  09/10/2020 Name: Aranda Bihm MRN: [id number] DOB: 17-Dec-1934  Anna Hoffman is a 86 y.o. year old female who is a primary care patient of Glendale Chard, MD. The CCM team was consulted to assist the patient with chronic disease management and/or care coordination needs related to: [employer] .   Engaged with patient by telephone for follow up visit in response to provider referral for social work chronic care management and care coordination services.   Consent to Services:  The patient was given information about Chronic Care Management services, agreed to services, and gave verbal consent prior to initiation of services.  Please see initial visit note for detailed documentation.   Patient agreed to services and consent obtained.   Assessment: Review of patient past medical history, allergies, medications, and health status, including review of relevant consultants reports was performed today as part of a comprehensive evaluation and provision of chronic care management and care coordination services.     SDOH (Social Determinants of Health) assessments and interventions performed:    Advanced Directives Status: Not addressed in this encounter.  CCM Care Plan  Allergies  Allergen Reactions  . Amoxicillin Diarrhea    Has patient had a PCN reaction causing immediate rash, facial/tongue/throat swelling, SOB or lightheadedness with hypotension: no Has patient had a PCN reaction causing severe rash involving mucus membranes or skin necrosis: no Has patient had a PCN reaction that required hospitalization: no pharmacist consult Has patient had a PCN reaction occurring within the last 10 years: yes If all of the above answers are "NO", then may proceed with Cephalosporin use.   . Ampicillin Rash    - Tolerates Rocephin and Ancef - Remote occurrence; no symptoms of anaphylaxis or severe cutaneous reaction, and no additional medical  attention required    . Sulfa Antibiotics Rash    Outpatient Encounter Medications as of 09/10/2020  Medication Sig  . acetaminophen (TYLENOL) 325 MG tablet Take 2 tablets (650 mg total) by mouth every 6 (six) hours as needed for mild pain (or Fever >/= 101).  Marland Kitchen amLODipine (NORVASC) 10 MG tablet TAKE 1 TABLET BY MOUTH ONCE DAILY  . Ascorbic Acid (VITAMIN C) 1000 MG tablet Take 1,000 mg by mouth daily.  Marland Kitchen aspirin EC 81 MG tablet Take 81 mg by mouth daily.  Marland Kitchen azelastine (ASTELIN) 0.1 % nasal spray Place 2 sprays into both nostrils 2 (two) times daily. Use in each nostril as directed (Patient taking differently: Place 2 sprays into both nostrils daily as needed for rhinitis.)  . benzonatate (TESSALON PERLES) 100 MG capsule Take 1 capsule (100 mg total) by mouth 3 (three) times daily as needed for cough. (Patient not taking: No sig reported)  . Carboxymethylcellul-Glycerin (LUBRICATING EYE DROPS OP) Apply 1 drop to eye daily as needed (dry eyes).   . Cyanocobalamin (B-12 COMPLIANCE INJECTION) 1000 MCG/ML KIT Inject 1,000 mcg as directed every 30 (thirty) days.  Marland Kitchen Fexofenadine HCl (ALLEGRA PO) Take 180 mg by mouth at bedtime.   Marland Kitchen glucose blood (ONETOUCH VERIO) test strip 1 each by Other route as needed for other. Use as instructed to check blood sugar 2 times per day dx: e11.65  . irbesartan (AVAPRO) 150 MG tablet TAKE 1 TABLET BY MOUTH EVERY DAY.  Marland Kitchen Lancets (ONETOUCH ULTRASOFT) lancets Use as instructed to check blood sugar 2 times per day dx code e11.65  . meclizine (ANTIVERT) 12.5 MG tablet Take 1 tablet (12.5 mg total)  by mouth 3 (three) times daily as needed for dizziness.  . Multiple Vitamins-Minerals (STRESS TAB NF PO) Take 1 tablet by mouth daily.  Marland Kitchen olopatadine (PATANOL) 0.1 % ophthalmic solution Place 1 drop into both eyes 2 (two) times daily.  Marland Kitchen omeprazole (PRILOSEC) 20 MG capsule TAKE 1 CAPSULE BY MOUTH ONCE DAILY 30 MINUTES TO 1 HOUR BEFORE A MEAL  . PAZEO 0.7 % SOLN INSTILL 1 DROP INTO  BOTH EYES DAILY.  . rosuvastatin (CRESTOR) 20 MG tablet TAKE 1 TABLET BY MOUTH ONCE DAILY  . triamcinolone (KENALOG) 0.1 % APPLY TO THE AFFECTED AREA(S) 2 TIMES DAILY  . trolamine salicylate (ASPERCREME) 10 % cream Apply 1 application topically as needed for muscle pain.   No facility-administered encounter medications on file as of 09/10/2020.    Patient Active Problem List   Diagnosis Date Noted  . Colitis, acute 08/11/2019  . Colitis 07/11/2019  . Loose stools 06/07/2019  . Herpes zoster without complication 48/88/9169  . Other long term (current) drug therapy 08/12/2018  . Chronic renal disease, stage III ([place]) 07/19/2018  . Hypertensive nephropathy 07/19/2018  . Community acquired pneumonia of right upper lobe of lung 11/07/2017  . Hypertensive emergency 07/01/2016  . Thalamic hemorrhage ([place]) 07/01/2016  . 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 Benign essential HTN   . Type 2 diabetes mellitus with stage 3 chronic kidney disease, without long-term current use of insulin ([place])   . Mixed hyperlipidemia   . ICH (intracerebral hemorrhage) ([place]) - hypertensive R thalamic hemorrhage  06/27/2016  . 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 Retroperitoneal bleed 02/08/2016  . Generalized abdominal pain   . Gastroenteritis 02/04/2016  . Diarrhea 02/04/2016  . Hypokalemia 02/04/2016  . Incarcerated paraesophageal hernia 09/02/2014  . Renal mass, left 09/02/2014  . Acute esophagitis 09/02/2014  . Gastric outlet obstruction 09/02/2014  . Hypertension   . Vomiting 09/01/2014    Conditions to be addressed/monitored: HTN and DMII; Care Coordination  Care Plan : Social Work Care Plan  Updates made by Daneen Schick since 09/10/2020 12:00 AM    Problem: Care Coordination     Long-Range Goal: Collaborate with RN Care Manager to perform appropriate assessments to assist with care coordination needs   Start Date: 08/10/2020  Expected End Date: 12/08/2020  Recent Progress: On track   Priority: Low  Note:   Current Barriers:  . Chronic conditions including HTN, Colitis, and DM II which put patient at increased risk of hospitalization  Social Work Clinical Goal(s):  Marland Kitchen Over the next 120 days, patient will work with SW to address concerns related to care coordination  Interventions: . 1:1 collaboration with Glendale Chard, MD regarding development and update of comprehensive plan of care as evidenced by provider attestation and co-signature . Inter-disciplinary care team collaboration (see longitudinal plan of care) . Successful outbound call placed to the patient to assist with care coordination needs . Discussed SW was contacted by [facility] re: patient need to find alternative housing . Determined the patient's son works in Personal assistant and assisting her with locating an alternative living arrangement . Advised the patient to contact SW as needed for assistance with housing resources . Informed by the patient her shingles have improved - patient continues to apply cream to back as needed . Scheduled follow up call over the next 60 days  Patient Goals/Self-Care Activities Over the next 60 days, patient will:   - Patient will self administer medications as prescribed Patient will attend  all scheduled provider appointments Contact SW as needed prior to next scheduled call   Follow up Plan: SW will follow up with patient by phone over the next 60 days       Follow Up Plan: SW will follow up with patient by phone over the next 60 days      Daneen Schick, BSW, CDP Social Worker, Certified Dementia Practitioner [place] [facility] [place] [facility] [phone]  Total time spent performing care coordination and/or care management activities with the patient by phone or face to face = 15 minutes.

## 2020-09-10 NOTE — Patient Instructions (Signed)
  Goals we discussed today:  Goals Addressed            This Visit's Progress   . Work with SW to manage care coordination needs       Timeframe:  Long-Range Goal Priority:  Low Start Date:    1.7.22                         Expected End Date:  5.7.22                     Next planned outreach date: 4.7.22   Patient Goals/Self-Care Activities Over the next 60 days, patient will:   - Patient will self administer medications as prescribed Patient will attend all scheduled provider appointments Contact SW as needed prior to next scheduled call

## 2020-09-12 NOTE — Patient Instructions (Signed)
Goals Addressed    .  Vertigo Managed   On track     Timeframe:  Long-Range Goal Priority:  High Start Date:  09/05/20                           Expected End Date: 12/03/20  Next Follow Up date: 10/15/20  . Continue to work with Dr. Benjamine Mola for management of Vertigo . Change positions slowly to help avoid loss of balance related to Vertigo . Attend all scheduled provider appointments . Call pharmacy for medication refills . Attend church or other social activities . Call provider office for new concerns or questions

## 2020-09-12 NOTE — Chronic Care Management (AMB) (Signed)
Chronic Care Management   CCM RN Visit Note  09/05/2020 Name: Anna Hoffman MRN: [id number] DOB: May 23, 1935  Subjective: Anna Hoffman is a 86 y.o. year old female who is a primary care patient of Anna Chard, MD. The care management team was consulted for assistance with disease management and care coordination needs.    Engaged with patient by telephone for follow up visit in response to provider referral for case management and/or care coordination services.   Consent to Services:  The patient was given information about Chronic Care Management services, agreed to services, and gave verbal consent prior to initiation of services.  Please see initial visit note for detailed documentation.   Patient agreed to services and verbal consent obtained.   Assessment: Review of patient past medical history, allergies, medications, health status, including review of consultants reports, laboratory and other test data, was performed as part of comprehensive evaluation and provision of chronic care management services.   SDOH (Social Determinants of Health) assessments and interventions performed:  No  CCM Care Plan  Allergies  Allergen Reactions  . Amoxicillin Diarrhea    Has patient had a PCN reaction causing immediate rash, facial/tongue/throat swelling, SOB or lightheadedness with hypotension: no Has patient had a PCN reaction causing severe rash involving mucus membranes or skin necrosis: no Has patient had a PCN reaction that required hospitalization: no pharmacist consult Has patient had a PCN reaction occurring within the last 10 years: yes If all of the above answers are "NO", then may proceed with Cephalosporin use.   . Ampicillin Rash    - Tolerates Rocephin and Ancef - Remote occurrence; no symptoms of anaphylaxis or severe cutaneous reaction, and no additional medical attention required    . Sulfa Antibiotics Rash    Outpatient Encounter Medications as of 09/05/2020   Medication Sig  . acetaminophen (TYLENOL) 325 MG tablet Take 2 tablets (650 mg total) by mouth every 6 (six) hours as needed for mild pain (or Fever >/= 101).  Marland Kitchen amLODipine (NORVASC) 10 MG tablet TAKE 1 TABLET BY MOUTH ONCE DAILY  . Ascorbic Acid (VITAMIN C) 1000 MG tablet Take 1,000 mg by mouth daily.  Marland Kitchen aspirin EC 81 MG tablet Take 81 mg by mouth daily.  Marland Kitchen azelastine (ASTELIN) 0.1 % nasal spray Place 2 sprays into both nostrils 2 (two) times daily. Use in each nostril as directed (Patient taking differently: Place 2 sprays into both nostrils daily as needed for rhinitis.)  . benzonatate (TESSALON PERLES) 100 MG capsule Take 1 capsule (100 mg total) by mouth 3 (three) times daily as needed for cough. (Patient not taking: No sig reported)  . Carboxymethylcellul-Glycerin (LUBRICATING EYE DROPS OP) Apply 1 drop to eye daily as needed (dry eyes).   . Cyanocobalamin (B-12 COMPLIANCE INJECTION) 1000 MCG/ML KIT Inject 1,000 mcg as directed every 30 (thirty) days.  Marland Kitchen Fexofenadine HCl (ALLEGRA PO) Take 180 mg by mouth at bedtime.   Marland Kitchen glucose blood (ONETOUCH VERIO) test strip 1 each by Other route as needed for other. Use as instructed to check blood sugar 2 times per day dx: e11.65  . irbesartan (AVAPRO) 150 MG tablet TAKE 1 TABLET BY MOUTH EVERY DAY.  Marland Kitchen Lancets (ONETOUCH ULTRASOFT) lancets Use as instructed to check blood sugar 2 times per day dx code e11.65  . meclizine (ANTIVERT) 12.5 MG tablet Take 1 tablet (12.5 mg total) by mouth 3 (three) times daily as needed for dizziness.  . Multiple Vitamins-Minerals (STRESS TAB NF PO) Take 1  tablet by mouth daily.  Marland Kitchen olopatadine (PATANOL) 0.1 % ophthalmic solution Place 1 drop into both eyes 2 (two) times daily.  Marland Kitchen omeprazole (PRILOSEC) 20 MG capsule TAKE 1 CAPSULE BY MOUTH ONCE DAILY 30 MINUTES TO 1 HOUR BEFORE A MEAL  . PAZEO 0.7 % SOLN INSTILL 1 DROP INTO BOTH EYES DAILY.  . rosuvastatin (CRESTOR) 20 MG tablet TAKE 1 TABLET BY MOUTH ONCE DAILY  .  triamcinolone (KENALOG) 0.1 % APPLY TO THE AFFECTED AREA(S) 2 TIMES DAILY  . trolamine salicylate (ASPERCREME) 10 % cream Apply 1 application topically as needed for muscle pain.   No facility-administered encounter medications on file as of 09/05/2020.    Patient Active Problem List   Diagnosis Date Noted  . Colitis, acute 08/11/2019  . Colitis 07/11/2019  . Loose stools 06/07/2019  . Herpes zoster without complication 83/66/2947  . Other long term (current) drug therapy 08/12/2018  . Chronic renal disease, stage III ([place]) 07/19/2018  . Hypertensive nephropathy 07/19/2018  . Community acquired pneumonia of right upper lobe of lung 11/07/2017  . Hypertensive emergency 07/01/2016  . Thalamic hemorrhage ([place]) 07/01/2016  . 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 Benign essential HTN   . Type 2 diabetes mellitus with stage 3 chronic kidney disease, without long-term current use of insulin ([place])   . Mixed hyperlipidemia   . ICH (intracerebral hemorrhage) ([place]) - hypertensive R thalamic hemorrhage  06/27/2016  . 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 Retroperitoneal bleed 02/08/2016  . Generalized abdominal pain   . Gastroenteritis 02/04/2016  . Diarrhea 02/04/2016  . Hypokalemia 02/04/2016  . Incarcerated paraesophageal hernia 09/02/2014  . Renal mass, left 09/02/2014  . Acute esophagitis 09/02/2014  . Gastric outlet obstruction 09/02/2014  . Hypertension   . Vomiting 09/01/2014    Conditions to be addressed/monitored:Type 2 DM with Stage III CKD, HTN, Hypertensive Nephropathy, Diastolic Dysfunction  Care Plan : Vestibular Balance Disorder  Updates made by Lynne Logan, RN since 09/12/2020 12:00 AM    Problem: Vestibular Balance Disorder   Priority: High    Long-Range Goal: Evaluation and treatment of Vertigo related to Vestibular Movement Disorder   Start Date: 09/05/2020  Expected End Date: 12/03/2020  This Visit's Progress: On track  Priority: High  Note:   CARE  PLAN ENTRY (see longitudinal plan of care for additional care plan information)  Current Barriers:  Marland Kitchen Knowledge Deficits related to evaluation and treatment of Vertigo . Chronic Disease Management support and education needs related to Type 2 DM with Stage III CKD, HTN, Hypertensive Nephropathy, Diastolic Dysfunction Nurse Case Manager Clinical Goal(s):  Marland Kitchen Over the next 90 days, patient will work with the CCM team to address needs related to evaluation and treatment of Vertigo CCM RN CM Interventions:  09/05/20 Successful call completed with patient  . Inter-disciplinary care team collaboration (see longitudinal plan of care) . Evaluation of current treatment plan related to Vertigo and patient's adherence to plan as established by provider. . Reiterated the importance of staying well hydrated and taking her time with position change to allow for BP adjustment with position changes . Determined patient is under the care of Dr. Benjamine Mola for Vestibular management and feels this condition has improved  . Discussed next PCP OV f/u scheduled for 10/10/20 '@3' :00 PM  . Discussed plans with patient for ongoing care management follow up and provided patient with direct contact information for care management team Patient Goals/Self Care Activities:  . Continue to work with Dr. Benjamine Mola  for management of Vertigo . Change positions slowly to help avoid loss of balance related to Vertigo . Attend all scheduled provider appointments . Call pharmacy for medication refills . Attend church or other social activities . Call provider office for new concerns or questions Next Scheduled Follow Up Call: 10/15/20    Plan:Telephone follow up appointment with care management team member scheduled for:  10/15/20  Barb Merino, RN, BSN, CCM Care Management Coordinator [facility]  Direct Phone: [phone]

## 2020-09-25 DIAGNOSIS — M170 Bilateral primary osteoarthritis of knee: Secondary | ICD-10-CM | POA: Diagnosis not present

## 2020-10-01 DIAGNOSIS — M542 Cervicalgia: Secondary | ICD-10-CM | POA: Diagnosis not present

## 2020-10-06 DIAGNOSIS — M25512 Pain in left shoulder: Secondary | ICD-10-CM | POA: Diagnosis not present

## 2020-10-06 DIAGNOSIS — M542 Cervicalgia: Secondary | ICD-10-CM | POA: Diagnosis not present

## 2020-10-08 DIAGNOSIS — M25512 Pain in left shoulder: Secondary | ICD-10-CM | POA: Diagnosis not present

## 2020-10-08 DIAGNOSIS — M542 Cervicalgia: Secondary | ICD-10-CM | POA: Diagnosis not present

## 2020-10-09 ENCOUNTER — Other Ambulatory Visit: Payer: Self-pay

## 2020-10-09 ENCOUNTER — Emergency Department (HOSPITAL_COMMUNITY): Payer: Medicare Other

## 2020-10-09 ENCOUNTER — Inpatient Hospital Stay (HOSPITAL_COMMUNITY)
Admission: EM | Admit: 2020-10-09 | Discharge: 2020-10-13 | DRG: 683 | Disposition: A | Discharge status: Home Health | Payer: Medicare Other | Attending: Internal Medicine | Admitting: Internal Medicine

## 2020-10-09 ENCOUNTER — Encounter (HOSPITAL_COMMUNITY): Payer: Self-pay

## 2020-10-09 DIAGNOSIS — N179 Acute kidney failure, unspecified: Secondary | ICD-10-CM | POA: Diagnosis not present

## 2020-10-09 DIAGNOSIS — M19012 Primary osteoarthritis, left shoulder: Secondary | ICD-10-CM | POA: Diagnosis present

## 2020-10-09 DIAGNOSIS — Z7982 Long term (current) use of aspirin: Secondary | ICD-10-CM

## 2020-10-09 DIAGNOSIS — Z800 Family history of malignant neoplasm of digestive organs: Secondary | ICD-10-CM | POA: Diagnosis not present

## 2020-10-09 DIAGNOSIS — Z808 Family history of malignant neoplasm of other organs or systems: Secondary | ICD-10-CM

## 2020-10-09 DIAGNOSIS — K529 Noninfective gastroenteritis and colitis, unspecified: Secondary | ICD-10-CM | POA: Diagnosis not present

## 2020-10-09 DIAGNOSIS — N1831 Chronic kidney disease, stage 3a: Secondary | ICD-10-CM | POA: Diagnosis present

## 2020-10-09 DIAGNOSIS — N281 Cyst of kidney, acquired: Secondary | ICD-10-CM | POA: Diagnosis not present

## 2020-10-09 DIAGNOSIS — Z8673 Personal history of transient ischemic attack (TIA), and cerebral infarction without residual deficits: Secondary | ICD-10-CM

## 2020-10-09 DIAGNOSIS — Z20822 Contact with and (suspected) exposure to covid-19: Secondary | ICD-10-CM | POA: Diagnosis not present

## 2020-10-09 DIAGNOSIS — Z820 Family history of epilepsy and other diseases of the nervous system: Secondary | ICD-10-CM | POA: Diagnosis not present

## 2020-10-09 DIAGNOSIS — E860 Dehydration: Secondary | ICD-10-CM | POA: Diagnosis not present

## 2020-10-09 DIAGNOSIS — E782 Mixed hyperlipidemia: Secondary | ICD-10-CM | POA: Diagnosis present

## 2020-10-09 DIAGNOSIS — D1771 Benign lipomatous neoplasm of kidney: Secondary | ICD-10-CM | POA: Diagnosis present

## 2020-10-09 DIAGNOSIS — K208 Other esophagitis without bleeding: Secondary | ICD-10-CM | POA: Diagnosis not present

## 2020-10-09 DIAGNOSIS — H9190 Unspecified hearing loss, unspecified ear: Secondary | ICD-10-CM | POA: Insufficient documentation

## 2020-10-09 DIAGNOSIS — I700 Atherosclerosis of aorta: Secondary | ICD-10-CM | POA: Diagnosis present

## 2020-10-09 DIAGNOSIS — E871 Hypo-osmolality and hyponatremia: Secondary | ICD-10-CM | POA: Diagnosis not present

## 2020-10-09 DIAGNOSIS — N189 Chronic kidney disease, unspecified: Secondary | ICD-10-CM | POA: Diagnosis present

## 2020-10-09 DIAGNOSIS — Z882 Allergy status to sulfonamides status: Secondary | ICD-10-CM

## 2020-10-09 DIAGNOSIS — A084 Viral intestinal infection, unspecified: Secondary | ICD-10-CM | POA: Diagnosis present

## 2020-10-09 DIAGNOSIS — E1122 Type 2 diabetes mellitus with diabetic chronic kidney disease: Secondary | ICD-10-CM | POA: Diagnosis present

## 2020-10-09 DIAGNOSIS — R1310 Dysphagia, unspecified: Secondary | ICD-10-CM | POA: Diagnosis not present

## 2020-10-09 DIAGNOSIS — Z86010 Personal history of colonic polyps: Secondary | ICD-10-CM

## 2020-10-09 DIAGNOSIS — E872 Acidosis: Secondary | ICD-10-CM | POA: Diagnosis not present

## 2020-10-09 DIAGNOSIS — K2100 Gastro-esophageal reflux disease with esophagitis, without bleeding: Secondary | ICD-10-CM | POA: Diagnosis present

## 2020-10-09 DIAGNOSIS — Z880 Allergy status to penicillin: Secondary | ICD-10-CM

## 2020-10-09 DIAGNOSIS — R9431 Abnormal electrocardiogram [ECG] [EKG]: Secondary | ICD-10-CM | POA: Diagnosis not present

## 2020-10-09 DIAGNOSIS — R630 Anorexia: Secondary | ICD-10-CM | POA: Diagnosis not present

## 2020-10-09 DIAGNOSIS — K449 Diaphragmatic hernia without obstruction or gangrene: Secondary | ICD-10-CM

## 2020-10-09 DIAGNOSIS — I129 Hypertensive chronic kidney disease with stage 1 through stage 4 chronic kidney disease, or unspecified chronic kidney disease: Secondary | ICD-10-CM | POA: Diagnosis not present

## 2020-10-09 DIAGNOSIS — R197 Diarrhea, unspecified: Secondary | ICD-10-CM

## 2020-10-09 DIAGNOSIS — N183 Chronic kidney disease, stage 3 unspecified: Secondary | ICD-10-CM | POA: Diagnosis present

## 2020-10-09 DIAGNOSIS — Z79899 Other long term (current) drug therapy: Secondary | ICD-10-CM

## 2020-10-09 DIAGNOSIS — R112 Nausea with vomiting, unspecified: Secondary | ICD-10-CM

## 2020-10-09 DIAGNOSIS — Z8042 Family history of malignant neoplasm of prostate: Secondary | ICD-10-CM

## 2020-10-09 DIAGNOSIS — I10 Essential (primary) hypertension: Secondary | ICD-10-CM | POA: Diagnosis not present

## 2020-10-09 DIAGNOSIS — K5730 Diverticulosis of large intestine without perforation or abscess without bleeding: Secondary | ICD-10-CM | POA: Diagnosis not present

## 2020-10-09 DIAGNOSIS — R1111 Vomiting without nausea: Secondary | ICD-10-CM | POA: Diagnosis not present

## 2020-10-09 DIAGNOSIS — E1165 Type 2 diabetes mellitus with hyperglycemia: Secondary | ICD-10-CM | POA: Diagnosis present

## 2020-10-09 DIAGNOSIS — E875 Hyperkalemia: Secondary | ICD-10-CM | POA: Diagnosis not present

## 2020-10-09 DIAGNOSIS — K2210 Ulcer of esophagus without bleeding: Secondary | ICD-10-CM | POA: Diagnosis not present

## 2020-10-09 DIAGNOSIS — K311 Adult hypertrophic pyloric stenosis: Secondary | ICD-10-CM | POA: Diagnosis not present

## 2020-10-09 LAB — COMPREHENSIVE METABOLIC PANEL
ALT: 28 U/L (ref 0–44)
ALT: 16 U/L (ref 0–44)
AST: 54 U/L — ABNORMAL HIGH (ref 15–41)
AST: 30 U/L (ref 15–41)
Albumin: 4.2 g/dL (ref 3.5–5.0)
Albumin: 4 g/dL (ref 3.5–5.0)
Alkaline Phosphatase: 60 U/L (ref 38–126)
Alkaline Phosphatase: 55 U/L (ref 38–126)
Anion gap: 9 (ref 5–15)
Anion gap: 8 (ref 5–15)
BUN: 42 mg/dL — ABNORMAL HIGH (ref 8–23)
BUN: 33 mg/dL — ABNORMAL HIGH (ref 8–23)
CO2: 13 mmol/L — ABNORMAL LOW (ref 22–32)
CO2: 13 mmol/L — ABNORMAL LOW (ref 22–32)
Calcium: 9.3 mg/dL (ref 8.9–10.3)
Calcium: 9.3 mg/dL (ref 8.9–10.3)
Chloride: 109 mmol/L (ref 98–111)
Chloride: 116 mmol/L — ABNORMAL HIGH (ref 98–111)
Creatinine, Ser: 1.79 mg/dL — ABNORMAL HIGH (ref 0.44–1.00)
Creatinine, Ser: 1.18 mg/dL — ABNORMAL HIGH (ref 0.44–1.00)
GFR, Estimated: 28 mL/min — ABNORMAL LOW (ref >60)
GFR, Estimated: 46 mL/min — ABNORMAL LOW (ref >60)
Glucose, Bld: 108 mg/dL — ABNORMAL HIGH (ref 70–99)
Glucose, Bld: 113 mg/dL — ABNORMAL HIGH (ref 70–99)
Potassium: >7.5 mmol/L (ref 3.5–5.1)
Potassium: 5.2 mmol/L — ABNORMAL HIGH (ref 3.5–5.1)
Sodium: 131 mmol/L — ABNORMAL LOW (ref 135–145)
Sodium: 137 mmol/L (ref 135–145)
Total Bilirubin: <0.1 mg/dL — ABNORMAL LOW (ref 0.3–1.2)
Total Bilirubin: 1.8 mg/dL — ABNORMAL HIGH (ref 0.3–1.2)
Total Protein: 7.6 g/dL (ref 6.5–8.1)
Total Protein: 7.2 g/dL (ref 6.5–8.1)

## 2020-10-09 LAB — CBC
HCT: 45.9 % (ref 36.0–46.0)
Hemoglobin: 14.8 g/dL (ref 12.0–15.0)
MCH: 29.7 pg (ref 26.0–34.0)
MCHC: 32.2 g/dL (ref 30.0–36.0)
MCV: 92.2 fL (ref 80.0–100.0)
Platelets: 372 10*3/uL (ref 150–400)
RBC: 4.98 MIL/uL (ref 3.87–5.11)
RDW: 15.2 % (ref 11.5–15.5)
WBC: 13.2 10*3/uL — ABNORMAL HIGH (ref 4.0–10.5)
nRBC: 0 % (ref 0.0–0.2)

## 2020-10-09 LAB — RESP PANEL BY RT-PCR (FLU A&B, COVID) ARPGX2
Influenza A by PCR: NEGATIVE
Influenza B by PCR: NEGATIVE
SARS Coronavirus 2 by RT PCR: NEGATIVE

## 2020-10-09 LAB — LIPASE, BLOOD: Lipase: 21 U/L (ref 11–51)

## 2020-10-09 MED ORDER — ACETAMINOPHEN 650 MG RE SUPP: 650.0000 mg | Freq: Four times a day (QID) | RECTAL | Status: DC | PRN

## 2020-10-09 MED ORDER — METOPROLOL TARTRATE 5 MG/5ML IV SOLN
5.0000 mg | Freq: Once | INTRAVENOUS | Status: AC
  Administered 2020-10-09: 5 mg via INTRAVENOUS
  Filled 2020-10-09: qty 5

## 2020-10-09 MED ORDER — INSULIN ASPART 100 UNIT/ML IV SOLN
5.0000 [IU] | Freq: Once | INTRAVENOUS | Status: AC
  Administered 2020-10-09: 5 [IU] via INTRAVENOUS
  Filled 2020-10-09: qty 0.05

## 2020-10-09 MED ORDER — SODIUM ZIRCONIUM CYCLOSILICATE 5 G PO PACK
5.0000 g | PACK | Freq: Every day | ORAL | Status: DC
  Administered 2020-10-09 – 2020-10-10 (×2): 5 g via ORAL
  Filled 2020-10-09 (×2): qty 1

## 2020-10-09 MED ORDER — SODIUM CHLORIDE 0.9 % IV SOLN: INTRAVENOUS | Status: AC

## 2020-10-09 MED ORDER — CALCIUM GLUCONATE-NACL 1-0.675 GM/50ML-% IV SOLN
1.0000 g | Freq: Once | INTRAVENOUS | Status: AC
  Administered 2020-10-09: 1000 mg via INTRAVENOUS
  Filled 2020-10-09: qty 50

## 2020-10-09 MED ORDER — SODIUM CHLORIDE 0.9 % IV BOLUS
1000.0000 mL | Freq: Once | INTRAVENOUS | Status: AC
  Administered 2020-10-09: 1000 mL via INTRAVENOUS

## 2020-10-09 MED ORDER — ENOXAPARIN SODIUM 30 MG/0.3ML ~~LOC~~ SOLN: 30.0000 mg | SUBCUTANEOUS | Status: DC

## 2020-10-09 MED ORDER — DEXTROSE 50 % IV SOLN
1.0000 | Freq: Once | INTRAVENOUS | Status: AC
  Administered 2020-10-09: 50 mL via INTRAVENOUS
  Filled 2020-10-09: qty 50

## 2020-10-09 MED ORDER — INSULIN ASPART 100 UNIT/ML ~~LOC~~ SOLN
0.0000 [IU] | Freq: Three times a day (TID) | SUBCUTANEOUS | Status: DC
  Filled 2020-10-09: qty 0.09

## 2020-10-09 MED ORDER — ACETAMINOPHEN 325 MG PO TABS
650.0000 mg | ORAL_TABLET | Freq: Four times a day (QID) | ORAL | Status: DC | PRN
  Administered 2020-10-10 – 2020-10-13 (×7): 650 mg via ORAL
  Filled 2020-10-09 (×6): qty 2

## 2020-10-09 MED ORDER — PROCHLORPERAZINE EDISYLATE 10 MG/2ML IJ SOLN
5.0000 mg | INTRAMUSCULAR | Status: DC | PRN
  Administered 2020-10-10 – 2020-10-11 (×2): 5 mg via INTRAVENOUS
  Filled 2020-10-09 (×2): qty 2

## 2020-10-09 MED ORDER — METOPROLOL TARTRATE 5 MG/5ML IV SOLN: 2.5000 mg | Freq: Once | INTRAVENOUS | Status: DC

## 2020-10-09 MED ORDER — ALBUTEROL SULFATE HFA 108 (90 BASE) MCG/ACT IN AERS
4.0000 | INHALATION_SPRAY | Freq: Once | RESPIRATORY_TRACT | Status: AC
  Administered 2020-10-09: 4 via RESPIRATORY_TRACT
  Filled 2020-10-09: qty 6.7

## 2020-10-09 MED ORDER — SODIUM CHLORIDE 0.9 % IV SOLN: 1.0000 g | Freq: Once | INTRAVENOUS | Status: DC

## 2020-10-09 NOTE — ED Triage Notes (Signed)
Patient c/o N/v/D x 3 days. patient denies any abdominal pain

## 2020-10-09 NOTE — Consult Note (Signed)
Nephrology Consult   Requesting provider: Sherwood Gambler, MD  Assessment/Recommendations:   AKI: likely secondary to prerenal injury from N/V/D in the context of ARB use. Baseline Cr is around 1 -recommend IV hydration with isotonic fluids, clinically is dry. Will order mIVF: NS at 75 cc/hr -UA w/ micrscopy -renal u/s -hold ARB -Avoid nephrotoxic medications including NSAIDs and iodinated intravenous contrast exposure unless the latter is absolutely indicated.  Preferred narcotic agents for pain control are hydromorphone, fentanyl, and methadone. Morphine should not be used. Avoid Baclofen and avoid oral sodium phosphate and magnesium citrate based laxatives / bowel preps. Continue strict Input and Output monitoring. Will monitor the patient closely with you and intervene or adjust therapy as indicated by changes in clinical status/labs   N/V/D -ct abd pelv w/o contrast pending. Mgmt per primary service  Hypertension: -hold irbesartan -continue with norvasc, consider coreg if needed  Hyperkalemia -likely related to AKI with ARB use -Met panel now, monitor serial labs -continue with lokelma 5g daily to start with, if needed can increase to 10g -continue with IV hydration  Hyponatremia -Na 131, relatively hypovolemic on exam as evidence by her dry mucosal membranes -s/p 2L NS, met panel now. Discussed w/ RN  Diabetes Mellitus Type 2 with Hyperglycemia -mgmt per primary  Angiomyolipoma -follows with [facility]  Recommendations conveyed to primary service.   [facility] 10/09/2020 7:33 PM   _____________________________________________________________________________________   History of Present Illness: Anna Hoffman is an 86 y.o. female with a past medical history of DM2, HTN, angiomyolipoma (follows with urology), h/o CVA who presents to [facility] with N/V/D x 3 days. She also reports that she had a fall a couple of weeks ago which resulted in  left shoulder pain. Upon presentation, she was found to have have a Na of 131, K >7.5, Bicarb of 13, BUN 42, Cr 1.8. She received 2L of NS, albuterol, insulin+d50, and lokelma 5g x 1 dose. She currently endorses thirst and hunger. She does report that she still has some nausea and felt sick when she took the [place]. She otherwise denies any fevers, chills, chest pain, SOB, orthopnea, changes in urinary frequency.   Medications:  Current Facility-Administered Medications  Medication Dose Route Frequency Provider Last Rate Last Admin  . calcium gluconate 1 g/ 50 mL sodium chloride IVPB  1 g Intravenous Once Minda Ditto, RPH 50 mL/hr at 10/09/20 1835 1,000 mg at 10/09/20 1835  . sodium zirconium cyclosilicate (LOKELMA) packet 5 g  5 g Oral Daily Sharyn Lull A, PA-C   5 g at 10/09/20 1741   Current Outpatient Medications  Medication Sig Dispense Refill  . acetaminophen (TYLENOL) 325 MG tablet Take 2 tablets (650 mg total) by mouth every 6 (six) hours as needed for mild pain (or Fever >/= 101).    Marland Kitchen amLODipine (NORVASC) 10 MG tablet TAKE 1 TABLET BY MOUTH ONCE DAILY (Patient taking differently: Take 10 mg by mouth daily.) 90 tablet 1  . Ascorbic Acid (VITAMIN C) 1000 MG tablet Take 1,000 mg by mouth daily.    Marland Kitchen aspirin EC 81 MG tablet Take 81 mg by mouth daily.    Marland Kitchen azelastine (ASTELIN) 0.1 % nasal spray Place 2 sprays into both nostrils 2 (two) times daily. Use in each nostril as directed (Patient taking differently: Place 2 sprays into both nostrils daily as needed for rhinitis.) 30 mL 5  . benzonatate (TESSALON PERLES) 100 MG capsule Take 1 capsule (100 mg total) by mouth 3 (  three) times daily as needed for cough. 30 capsule 0  . Carboxymethylcellul-Glycerin (LUBRICATING EYE DROPS OP) Apply 1 drop to eye daily as needed (dry eyes).     Marland Kitchen Fexofenadine HCl (ALLEGRA PO) Take 180 mg by mouth at bedtime.     . irbesartan (AVAPRO) 150 MG tablet TAKE 1 TABLET BY MOUTH EVERY DAY. (Patient taking  differently: Take 150 mg by mouth daily.) 90 tablet 2  . omeprazole (PRILOSEC) 20 MG capsule TAKE 1 CAPSULE BY MOUTH ONCE DAILY 30 MINUTES TO 1 HOUR BEFORE A MEAL (Patient taking differently: Take 20 mg by mouth daily.) 90 capsule 1  . rosuvastatin (CRESTOR) 20 MG tablet TAKE 1 TABLET BY MOUTH ONCE DAILY (Patient taking differently: Take 20 mg by mouth daily.) 90 tablet 1  . trolamine salicylate (ASPERCREME) 10 % cream Apply 1 application topically daily as needed for muscle pain.    . vitamin B-12 (CYANOCOBALAMIN) 100 MCG tablet Take 100 mcg by mouth daily.    Marland Kitchen glucose blood (ONETOUCH VERIO) test strip 1 each by Other route as needed for other. Use as instructed to check blood sugar 2 times per day dx: e11.65 100 each 11  . Lancets (ONETOUCH ULTRASOFT) lancets Use as instructed to check blood sugar 2 times per day dx code e11.65 100 each 12  . meclizine (ANTIVERT) 12.5 MG tablet Take 1 tablet (12.5 mg total) by mouth 3 (three) times daily as needed for dizziness. (Patient not taking: No sig reported) 30 tablet 0  . olopatadine (PATANOL) 0.1 % ophthalmic solution Place 1 drop into both eyes 2 (two) times daily. (Patient not taking: Reported on 10/09/2020) 5 mL 5  . PAZEO 0.7 % SOLN INSTILL 1 DROP INTO BOTH EYES DAILY. (Patient not taking: Reported on 10/09/2020) 2.5 mL 0  . triamcinolone (KENALOG) 0.1 % APPLY TO THE AFFECTED AREA(S) 2 TIMES DAILY (Patient not taking: No sig reported) 30 g 0     ALLERGIES Amoxicillin, Ampicillin, and Sulfa antibiotics  MEDICAL HISTORY Past Medical History:  Diagnosis Date  . Arthritis   . Diabetes mellitus without complication ([place])   . Hypertension   . Hypokalemia   . Pneumonia   . Shingles   . Stroke [facility])      SOCIAL HISTORY Social History   Socioeconomic History  . Marital status: Widowed    Spouse name: Not on file  . Number of children: 2  . Years of education: Not on file  . Highest education level: Not on file  Occupational History  .  Occupation: retired  Tobacco Use  . Smoking status: Never Smoker  . Smokeless tobacco: Never Used  Vaping Use  . Vaping Use: Never used  Substance and Sexual Activity  . Alcohol use: No  . Drug use: No  . Sexual activity: Not Currently    Birth control/protection: Surgical  Other Topics Concern  . Not on file  Social History Narrative   Son [name] lives with her   Social Determinants of Health   Financial Resource Strain: Low Risk   . Difficulty of Paying Living Expenses: Not hard at all  Food Insecurity: No Food Insecurity  . Worried About Charity fundraiser in the Last Year: Never true  . Ran Out of Food in the Last Year: Never true  Transportation Needs: No Transportation Needs  . Lack of Transportation (Medical): No  . Lack of Transportation (Non-Medical): No  Physical Activity: Insufficiently Active  . Days of Exercise per Week: 7 days  .  Minutes of Exercise per Session: 10 min  Stress: No Stress Concern Present  . Feeling of Stress : Not at all  Social Connections: Not on file  Intimate Partner Violence: Not on file     FAMILY HISTORY Family History  Problem Relation Age of Onset  . Alzheimer's disease Mother   . Prostate cancer Father   . Brain cancer Brother   . Brain cancer Brother   . Pancreatic cancer Sister   . Allergic rhinitis Neg Hx   . Asthma Neg Hx   . Eczema Neg Hx   . Urticaria Neg Hx     Review of Systems: 12 systems reviewed Otherwise as per HPI, all other systems reviewed and negative  Physical Exam: Vitals:   10/09/20 1845 10/09/20 1900  BP:  (!) 152/91  Pulse: (!) 122 (!) 123  Resp: 20 18  Temp:    SpO2: 97% 96%   No intake/output data recorded. No intake or output data in the 24 hours ending 10/09/20 1933 General: well-appearing, no acute distress HEENT: anicteric sclera, oropharynx clear without lesions, dry mucosal membranes CV: regular rate, normal rhythm, no murmurs, no gallops, no rubs Lungs: clear to auscultation  bilaterally, normal work of breathing, speaking in full sentences Abd: soft, non-tender, non-distended Skin: no visible lesions or rashes Musculoskeletal: no edema Neuro: normal speech, no gross focal deficits, moves all ext spontaneously   Test Results Reviewed Lab Results  Component Value Date   NA 131 (L) 10/09/2020   K >7.5 (HH) 10/09/2020   CL 109 10/09/2020   CO2 13 (L) 10/09/2020   BUN 42 (H) 10/09/2020   CREATININE 1.79 (H) 10/09/2020   CALCIUM 9.3 10/09/2020   ALBUMIN 4.2 10/09/2020   PHOS 4.3 08/27/2020     I have reviewed all relevant outside healthcare records related to the patient's kidney injury.

## 2020-10-09 NOTE — H&P (Signed)
History and Physical    Anna Hoffman PJA:[id number] DOB: 01-09-1935 DOA: 10/09/2020  PCP: Glendale Chard, MD  Patient coming from: Home.  I have personally briefly reviewed patient's old medical records in [place]  Chief Complaint: Nausea, vomiting and diarrhea x3 days.  HPI: Anna Hoffman is a 86 y.o. female with medical history significant of osteoarthritis, type II DM, hypertension, hypokalemia, history of pneumonia, history of varicella-zoster, history of thalamic hemorrhagic stroke who is coming to the emergency department due to nausea, vomiting and diarrhea for the past 3 days.  She denies abdominal pain, melena or hematochezia.  No fever, chills, but feels a little fatigued.  No rhinorrhea, sore throat, wheezing or hemoptysis.  No chest pain, palpitations, diaphoresis, dizziness, PND, orthopnea or pitting edema of the lower extremities.  No dysuria, frequency or hematuria.  No polyuria, polydipsia, polyphagia or blurred vision.  ED Course: Initial vital signs were temperature 97.7 F, pulse 120, respiration 18, BP 134/84 mmHg and O2 sat 95% on room air. She received 2000 mL of NS bolus, 4 inhalations of albuterol MDI, dextrose, insulin and calcium gluconate.  Nephrology added [facility].  Lab work: Her CBC was 13.2, hemoglobin 14.8 g/dL platelets 372.  CMP showed a sodium 131, potassium higher than 7.5, chloride 101 and CO2 13 mmol/L with an anion gap of 9.  Glucose 108, BUN 42 and creatinine 1.79 mg/dL.  Her most recent creatinine level was 1.08 mg/dL in November 2021.  LFTs are unremarkable, except for a slightly elevated AST of 54 units/L.  Lipase was normal.  Coronavirus PCR was negative.  Imaging: CT abdomen/pelvis without contrast showed distal colonic diverticulosis with no evidence of diverticulitis.  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  There is a stable hiatal hernia.  Aortic atherosclerosis.  Renal ultrasound showed increased renal cortical  echotexture right kidney, consistent with medical renal disease.  Left simple left renal cyst and angiomyolipoma again seen.  Please see images and full radiology report for further detail.  Review of Systems: As per HPI otherwise all other systems reviewed and are negative.  Past Medical History:  Diagnosis Date  . Arthritis   . Diabetes mellitus without complication ([place])   . Hypertension   . Hypokalemia   . Pneumonia   . Shingles   . Stroke [facility])    Past Surgical History:  Procedure Laterality Date  . ABDOMINAL HYSTERECTOMY    . BREAST EXCISIONAL BIOPSY Left   . ESOPHAGOGASTRODUODENOSCOPY N/A 09/01/2014   Procedure: ESOPHAGOGASTRODUODENOSCOPY (EGD);  Surgeon: Lear Ng, MD;  Location: [facility];  Service: Endoscopy;  Laterality: N/A;  . HIATAL HERNIA REPAIR N/A 09/04/2014   Procedure: LAPAROSCOPIC REPAIR OF HIATAL HERNIA;  Surgeon: Excell Seltzer, MD;  Location: [facility];  Service: General;  Laterality: N/A;  With MESH  . IR GENERIC HISTORICAL  04/10/2016   IR US GUIDE VASC ACCESS RIGHT 04/10/2016 Corrie Mckusick, DO [facility]  . IR GENERIC HISTORICAL  04/10/2016   IR ANGIOGRAM SELECTIVE EACH ADDITIONAL VESSEL 04/10/2016 Corrie Mckusick, DO [facility]  . IR GENERIC HISTORICAL  04/10/2016   IR ANGIOGRAM SELECTIVE EACH ADDITIONAL VESSEL 04/10/2016 Corrie Mckusick, DO [facility]  . IR GENERIC HISTORICAL  04/10/2016   IR EMBO TUMOR ORGAN ISCHEMIA INFARCT INC GUIDE ROADMAPPING 04/10/2016 Corrie Mckusick, DO [facility]  . IR GENERIC HISTORICAL  04/10/2016   IR ANGIOGRAM SELECTIVE EACH ADDITIONAL VESSEL 04/10/2016 Corrie Mckusick, DO [facility]  . IR GENERIC HISTORICAL  04/10/2016  IR ANGIOGRAM SELECTIVE EACH ADDITIONAL VESSEL 04/10/2016 Corrie Mckusick, DO [facility]  . IR GENERIC HISTORICAL  04/10/2016   IR RENAL SELECTIVE  UNI INC S&I MOD SED 04/10/2016 Corrie Mckusick, DO [facility]  . IR GENERIC HISTORICAL  03/06/2016   IR RADIOLOGIST EVAL & MGMT 03/06/2016 Corrie Mckusick, DO [facility]  .  IR GENERIC HISTORICAL  03/26/2016   IR RADIOLOGIST EVAL & MGMT 03/26/2016 Corrie Mckusick, DO [facility]  . IR GENERIC HISTORICAL  04/29/2016   IR RADIOLOGIST EVAL & MGMT 04/29/2016 [facility]  . IR RADIOLOGIST EVAL & MGMT  11/12/2016  . IR RADIOLOGIST EVAL & MGMT  12/16/2017  . KNEE SURGERY    . TONSILLECTOMY     Social History  reports that she has never smoked. She has never used smokeless tobacco. She reports that she does not drink alcohol and does not use drugs.  Allergies  Allergen Reactions  . Amoxicillin Diarrhea    Has patient had a PCN reaction causing immediate rash, facial/tongue/throat swelling, SOB or lightheadedness with hypotension: no Has patient had a PCN reaction causing severe rash involving mucus membranes or skin necrosis: no Has patient had a PCN reaction that required hospitalization: no pharmacist consult Has patient had a PCN reaction occurring within the last 10 years: yes If all of the above answers are "NO", then may proceed with Cephalosporin use.   . Ampicillin Rash    - Tolerates Rocephin and Ancef - Remote occurrence; no symptoms of anaphylaxis or severe cutaneous reaction, and no additional medical attention required    . Sulfa Antibiotics Rash   Family History  Problem Relation Age of Onset  . Alzheimer's disease Mother   . Prostate cancer Father   . Brain cancer Brother   . Brain cancer Brother   . Pancreatic cancer Sister   . Allergic rhinitis Neg Hx   . Asthma Neg Hx   . Eczema Neg Hx   . Urticaria Neg Hx    Prior to Admission medications   Medication Sig Start Date End Date Taking? Authorizing Provider  acetaminophen (TYLENOL) 325 MG tablet Take 2 tablets (650 mg total) by mouth every 6 (six) hours as needed for mild pain (or Fever >/= 101). 02/08/16  Yes Johnson, Clanford L, MD  amLODipine (NORVASC) 10 MG tablet TAKE 1 TABLET BY MOUTH ONCE DAILY Patient taking differently: Take 10 mg by mouth daily. 08/24/20  Yes Glendale Chard,  MD  Ascorbic Acid (VITAMIN C) 1000 MG tablet Take 1,000 mg by mouth daily.   Yes [provider]  aspirin EC 81 MG tablet Take 81 mg by mouth daily.   Yes [provider]  azelastine (ASTELIN) 0.1 % nasal spray Place 2 sprays into both nostrils 2 (two) times daily. Use in each nostril as directed Patient taking differently: Place 2 sprays into both nostrils daily as needed for rhinitis. 10/06/18  Yes Padgett, Rae Halsted, MD  benzonatate (TESSALON PERLES) 100 MG capsule Take 1 capsule (100 mg total) by mouth 3 (three) times daily as needed for cough. 12/10/19  Yes Glendale Chard, MD  Carboxymethylcellul-Glycerin (LUBRICATING EYE DROPS OP) Apply 1 drop to eye daily as needed (dry eyes).    Yes [provider]  Fexofenadine HCl (ALLEGRA PO) Take 180 mg by mouth at bedtime.    Yes [provider]  irbesartan (AVAPRO) 150 MG tablet TAKE 1 TABLET BY MOUTH EVERY DAY. Patient taking differently: Take 150 mg by mouth daily. 04/02/20  Yes  Glendale Chard, MD  omeprazole (PRILOSEC) 20 MG capsule TAKE 1 CAPSULE BY MOUTH ONCE DAILY 30 MINUTES TO 1 HOUR BEFORE A MEAL Patient taking differently: Take 20 mg by mouth daily. 08/06/20  Yes Glendale Chard, MD  rosuvastatin (CRESTOR) 20 MG tablet TAKE 1 TABLET BY MOUTH ONCE DAILY Patient taking differently: Take 20 mg by mouth daily. 08/31/20  Yes Glendale Chard, MD  trolamine salicylate (ASPERCREME) 10 % cream Apply 1 application topically daily as needed for muscle pain.   Yes [provider]  vitamin B-12 (CYANOCOBALAMIN) 100 MCG tablet Take 100 mcg by mouth daily.   Yes [provider]  glucose blood (ONETOUCH VERIO) test strip 1 each by Other route as needed for other. Use as instructed to check blood sugar 2 times per day dx: e11.65 10/19/19   Glendale Chard, MD  Lancets [facility] ULTRASOFT) lancets Use as instructed to check blood sugar 2 times per day dx code e11.65 10/19/19   Glendale Chard, MD  meclizine  (ANTIVERT) 12.5 MG tablet Take 1 tablet (12.5 mg total) by mouth 3 (three) times daily as needed for dizziness. Patient not taking: No sig reported 06/21/20   Bary Castilla, NP  olopatadine (PATANOL) 0.1 % ophthalmic solution Place 1 drop into both eyes 2 (two) times daily. Patient not taking: Reported on 10/09/2020 06/15/19   Kennith Gain, MD  PAZEO 0.7 % SOLN INSTILL 1 DROP INTO BOTH EYES DAILY. Patient not taking: Reported on 10/09/2020 12/19/19   Kennith Gain, MD  triamcinolone (KENALOG) 0.1 % APPLY TO THE AFFECTED AREA(S) 2 TIMES DAILY Patient not taking: No sig reported 08/24/20   Glendale Chard, MD   Physical Exam: Vitals:   10/09/20 2100 10/09/20 2115 10/09/20 2130 10/09/20 2215  BP: (!) 163/85 (!) 163/96 (!) 182/105 129/80  Pulse:  (!) 121 98 (!) 103  Resp:  (!) 22 20 17   Temp:      TempSrc:      SpO2:  95% 95% 95%  Weight:      Height:       Constitutional: NAD, calm, comfortable Eyes: PERRL, lids and conjunctivae normal ENMT: Mucous membranes are moist. Posterior pharynx clear of any exudate or lesions. Neck: normal, supple, no masses, no thyromegaly Respiratory: clear to auscultation bilaterally, no wheezing, no crackles. Normal respiratory effort. No accessory muscle use.  Cardiovascular: Tachycardic with a regular rhythm in the 120s, no murmurs / rubs / gallops. No extremity edema. 2+ pedal pulses. No carotid bruits.  Abdomen: No distention.  Bowel sounds positive.  Soft, no tenderness, no masses palpated. No hepatosplenomegaly.   Musculoskeletal: no clubbing / cyanosis.  Mild generalized weakness.  Good ROM, no contractures. Normal muscle tone.  Skin: no rashes, lesions, ulcers on limited dermatological examination. Neurologic: CN 2-12 grossly intact. Sensation intact, DTR normal. Strength 5/5 in all 4.  Psychiatric: Normal judgment and insight. Alert and oriented x 3. Normal mood.   Labs on Admission: I have personally reviewed following labs  and imaging studies  CBC: Recent Labs  Lab 10/09/20 1514  WBC 13.2*  HGB 14.8  HCT 45.9  MCV 92.2  PLT 497    Basic Metabolic Panel: Recent Labs  Lab 10/09/20 1514  NA 131*  K >7.5*  CL 109  CO2 13*  GLUCOSE 108*  BUN 42*  CREATININE 1.79*  CALCIUM 9.3    GFR: Estimated Creatinine Clearance: 21.4 mL/min (A) (by C-G formula based on SCr of 1.79 mg/dL (H)).  Liver Function Tests: Recent Labs  Lab 10/09/20 1514  AST 54*  ALT 28  ALKPHOS 60  BILITOT <0.1*  PROT 7.6  ALBUMIN 4.2   Radiological Exams on Admission: CT ABDOMEN PELVIS WO CONTRAST  Result Date: 10/09/2020 CLINICAL DATA:  Left lower quadrant abdominal pain, nausea/vomiting/diarrhea for 3 days EXAM: CT ABDOMEN AND PELVIS WITHOUT CONTRAST TECHNIQUE: Multidetector CT imaging of the abdomen and pelvis was performed following the standard protocol without IV contrast. COMPARISON:  08/11/2019 FINDINGS: Lower chest: No acute pleural or parenchymal lung disease. Stable hiatal hernia. Unenhanced CT was performed per clinician order. Lack of IV contrast limits sensitivity and specificity, especially for evaluation of abdominal/pelvic solid viscera. Hepatobiliary: No focal liver abnormality is seen. No gallstones, gallbladder wall thickening, or biliary dilatation. Pancreas: Unremarkable. No pancreatic ductal dilatation or surrounding inflammatory changes. Spleen: Normal in size without focal abnormality. Adrenals/Urinary Tract: Stable fat containing left adrenal lesion measuring 2.7 cm consistent with angiomyolipoma. Stable benign cyst lower pole left kidney. No urinary tract calculi or obstructive uropathy within either kidney. Bladder is unremarkable. The adrenals are normal. Stomach/Bowel: No bowel obstruction or ileus. Diverticulosis of the descending and sigmoid colon again identified, with no evidence of acute diverticulitis. Evaluation of the bowel is limited by the lack of intravenous and oral contrast. No obvious bowel  wall thickening or inflammatory change. Vascular/Lymphatic: Aortic atherosclerosis. No enlarged abdominal or pelvic lymph nodes. Reproductive: Status post hysterectomy. No adnexal masses. Other: No free fluid or free gas.  No abdominal wall hernia. Musculoskeletal: No acute or destructive bony lesions. Stable degenerative changes of the lumbosacral junction. Reconstructed images demonstrate no additional findings. IMPRESSION: 1. Distal colonic diverticulosis with no evidence of acute diverticulitis. 2. 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 3. Stable hiatal hernia. 4.  Aortic Atherosclerosis (ICD10-I70.0). Electronically Signed   By: Randa Ngo M.D.   On: 10/09/2020 20:14   US RENAL  Result Date: 10/09/2020 CLINICAL DATA:  Acute renal insufficiency EXAM: RENAL / URINARY TRACT ULTRASOUND COMPLETE COMPARISON:  10/09/2020, 08/11/2019 FINDINGS: Right Kidney: Renal measurements: 9.1 x 4.3 x 5.0 cm = volume: 101.8 mL. Increased renal cortical echotexture. No mass or hydronephrosis visualized. Left Kidney: Renal measurements: 9.4 x 4.5 by 5.1 cm = volume: 112.8 mL. Echogenicity within normal limits. 3.5 cm cyst lower pole left kidney. The left renal angiomyolipoma seen on recent CT not well visualized by ultrasound. No hydronephrosis. Bladder: Appears normal for degree of bladder distention. Other: None. IMPRESSION: 1. Increased renal cortical echotexture right kidney, consistent with medical renal disease. 2. Simple left renal cyst. Left renal angiomyolipoma seen on preceding CT not well visualized on this study. Electronically Signed   By: Randa Ngo M.D.   On: 10/09/2020 20:34    EKG: Independently reviewed.  Vent. rate 113 BPM PR interval * ms QRS duration 79 ms QT/QTc 391/537 ms P-R-T axes 222 26 57 Sinus or ectopic atrial tachycardia Atrial premature complex Prolonged PR interval Minimal ST depression, lateral leads Prolonged QT interval  Assessment/Plan Principal Problem:    Acute renal failure superimposed on stage 3a CKD    with hyperkalemia ([name]) Observation/progressive unit. Continue IV fluids. Discontinue ARB. Lokelma 5 mg p.o. x1. Monitor intake and output. Follow-up renal function and electrolytes.  Active Problems:   Hyponatremia Secondary to GI losses. Received 0.9% NaCl 2000 mL bolus. Continue normal saline infusion. Follow-up sodium level.    Hypertension Discontinue irbesartan. Continue amlodipine 10 mg p.o. daily. Has responded well to metoprolol IV. Begin Coreg 3.125 mg p.o. twice daily in a.m. Monitor  blood pressure and heart rate.    Gastroenteritis Continue IV hydrations. Symptoms treatment.    Type 2 diabetes mellitus with stage 3 chronic kidney disease,     without long-term current use of insulin ([name]) Carbohydrate modified diet. CBG monitoring with RI SS. Check hemoglobin A1c.    Mixed hyperlipidemia Continue Crestor 20 mg p.o. daily.    Aortic atherosclerosis ([name]) On rosuvastatin.    Prolonged QT interval Improved with beta-blocker IV. Avoid QT prolonging meds. Optimize electrolytes.   DVT prophylaxis: Lovenox SQ. Code Status:   Full code. Family Communication: Disposition Plan:   Patient is from:  Home.  Anticipated DC to:  Home.  Anticipated DC date:  10/10/2020.  Anticipated DC barriers: Clinical status.  Consults called:  Nephrology Gean Quint, MD) Admission status:  Observation/progressive unit.  Severity of Illness:  Reubin Milan MD [facility]  How to contact the [facility] Attending or Consulting provider [place] or covering provider during after hours [place], for this patient?   1. Check the care team in [facility] and look for a) attending/consulting [facility] provider listed and b) the [facility] team listed 2. Log into [email or web address] and use [place]'s universal password to access. If you do not have the password, please contact the hospital operator. 3. Locate the [facility] provider you are looking for  under [facility] and page to a number that you can be directly reached. 4. If you still have difficulty reaching the provider, please page the [facility] (Director on Call) for the Hospitalists listed on amion for assistance.  10/09/2020, 11:10 PM   This document was prepared using Dragon voice recognition software and may contain some unintended transcription errors.

## 2020-10-09 NOTE — ED Provider Notes (Signed)
[facility] DEPT Provider Note   CSN: [id number] Arrival date & time: 10/09/20  1456     History Chief Complaint  Patient presents with  . Emesis  . Diarrhea    Anna Hoffman is a 86 y.o. female.  HPI 86 year old female with a history of DM type II, hypertension, hypokalemia, pneumonia, shingles, stroke presents to the ER with complaints of 3 days of nausea, nonbloody, nonbilious emesis and nonbloody diarrhea.  She denies any fevers, chills, abdominal pain.  No recent medications injuries, antibiotics.  She states that she fell a couple weeks ago and had some pain to her left shoulder.  She had an MRI several days ago which just showed that she had some arthritis in her left shoulder.  She has not started any new medications.  She denies any chest pain or shortness of breath, just states that she is feeling badly.  She is vaccinated and boosted her COVID-19.  No other sick contacts.  She does not think she ate any bad food.  Denies any dysuria hematuria.  No back pain.    Past Medical History:  Diagnosis Date  . Arthritis   . Diabetes mellitus without complication ([place])   . Hypertension   . Hypokalemia   . Pneumonia   . Shingles   . Stroke [facility])     Patient Active Problem List   Diagnosis Date Noted  . Aortic atherosclerosis ([place]) 10/09/2020  . Acute renal failure superimposed on stage 3a chronic kidney disease ([place]) 10/09/2020  . Colitis, acute 08/11/2019  . Colitis 07/11/2019  . Loose stools 06/07/2019  . Herpes zoster without complication 38/75/6433  . Other long term (current) drug therapy 08/12/2018  . Chronic renal disease, stage III ([place]) 07/19/2018  . Hypertensive nephropathy 07/19/2018  . Community acquired pneumonia of right upper lobe of lung 11/07/2017  . Hypertensive emergency 07/01/2016  . Thalamic hemorrhage ([place]) 07/01/2016  . 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 Benign essential HTN   . Type 2 diabetes mellitus with  stage 3 chronic kidney disease, without long-term current use of insulin ([place])   . Mixed hyperlipidemia   . ICH (intracerebral hemorrhage) ([place]) - hypertensive R thalamic hemorrhage  06/27/2016  . 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 Retroperitoneal bleed 02/08/2016  . Generalized abdominal pain   . Gastroenteritis 02/04/2016  . Diarrhea 02/04/2016  . Hypokalemia 02/04/2016  . Incarcerated paraesophageal hernia 09/02/2014  . Renal mass, left 09/02/2014  . Acute esophagitis 09/02/2014  . Gastric outlet obstruction 09/02/2014  . Hypertension   . Vomiting 09/01/2014    Past Surgical History:  Procedure Laterality Date  . ABDOMINAL HYSTERECTOMY    . BREAST EXCISIONAL BIOPSY Left   . ESOPHAGOGASTRODUODENOSCOPY N/A 09/01/2014   Procedure: ESOPHAGOGASTRODUODENOSCOPY (EGD);  Surgeon: Lear Ng, MD;  Location: [facility];  Service: Endoscopy;  Laterality: N/A;  . HIATAL HERNIA REPAIR N/A 09/04/2014   Procedure: LAPAROSCOPIC REPAIR OF HIATAL HERNIA;  Surgeon: Excell Seltzer, MD;  Location: [facility];  Service: General;  Laterality: N/A;  With MESH  . IR GENERIC HISTORICAL  04/10/2016   IR US GUIDE VASC ACCESS RIGHT 04/10/2016 Corrie Mckusick, DO [facility]  . IR GENERIC HISTORICAL  04/10/2016   IR ANGIOGRAM SELECTIVE EACH ADDITIONAL VESSEL 04/10/2016 Corrie Mckusick, DO [facility]  . IR GENERIC HISTORICAL  04/10/2016   IR ANGIOGRAM SELECTIVE EACH ADDITIONAL VESSEL 04/10/2016 Corrie Mckusick, DO [facility]  . IR GENERIC HISTORICAL  04/10/2016   IR EMBO TUMOR  ORGAN ISCHEMIA INFARCT INC GUIDE ROADMAPPING 04/10/2016 Corrie Mckusick, DO [facility]  . IR GENERIC HISTORICAL  04/10/2016   IR ANGIOGRAM SELECTIVE EACH ADDITIONAL VESSEL 04/10/2016 Corrie Mckusick, DO [facility]  . IR GENERIC HISTORICAL  04/10/2016   IR ANGIOGRAM SELECTIVE EACH ADDITIONAL VESSEL 04/10/2016 Corrie Mckusick, DO [facility]  . IR GENERIC HISTORICAL  04/10/2016   IR RENAL SELECTIVE  UNI INC S&I MOD SED 04/10/2016 Corrie Mckusick, DO  [facility]  . IR GENERIC HISTORICAL  03/06/2016   IR RADIOLOGIST EVAL & MGMT 03/06/2016 Corrie Mckusick, DO [facility]  . IR GENERIC HISTORICAL  03/26/2016   IR RADIOLOGIST EVAL & MGMT 03/26/2016 Corrie Mckusick, DO [facility]  . IR GENERIC HISTORICAL  04/29/2016   IR RADIOLOGIST EVAL & MGMT 04/29/2016 [facility]  . IR RADIOLOGIST EVAL & MGMT  11/12/2016  . IR RADIOLOGIST EVAL & MGMT  12/16/2017  . KNEE SURGERY    . TONSILLECTOMY       OB History   No obstetric history on file.     Family History  Problem Relation Age of Onset  . Alzheimer's disease Mother   . Prostate cancer Father   . Brain cancer Brother   . Brain cancer Brother   . Pancreatic cancer Sister   . Allergic rhinitis Neg Hx   . Asthma Neg Hx   . Eczema Neg Hx   . Urticaria Neg Hx     Social History   Tobacco Use  . Smoking status: Never Smoker  . Smokeless tobacco: Never Used  Vaping Use  . Vaping Use: Never used  Substance Use Topics  . Alcohol use: No  . Drug use: No    Home Medications Prior to Admission medications   Medication Sig Start Date End Date Taking? Authorizing Provider  acetaminophen (TYLENOL) 325 MG tablet Take 2 tablets (650 mg total) by mouth every 6 (six) hours as needed for mild pain (or Fever >/= 101). 02/08/16  Yes Johnson, Clanford L, MD  amLODipine (NORVASC) 10 MG tablet TAKE 1 TABLET BY MOUTH ONCE DAILY Patient taking differently: Take 10 mg by mouth daily. 08/24/20  Yes Glendale Chard, MD  Ascorbic Acid (VITAMIN C) 1000 MG tablet Take 1,000 mg by mouth daily.   Yes [provider]  aspirin EC 81 MG tablet Take 81 mg by mouth daily.   Yes [provider]  azelastine (ASTELIN) 0.1 % nasal spray Place 2 sprays into both nostrils 2 (two) times daily. Use in each nostril as directed Patient taking differently: Place 2 sprays into both nostrils daily as needed for rhinitis. 10/06/18  Yes Padgett, Rae Halsted, MD  benzonatate (TESSALON PERLES) 100 MG  capsule Take 1 capsule (100 mg total) by mouth 3 (three) times daily as needed for cough. 12/10/19  Yes Glendale Chard, MD  Carboxymethylcellul-Glycerin (LUBRICATING EYE DROPS OP) Apply 1 drop to eye daily as needed (dry eyes).    Yes [provider]  Fexofenadine HCl (ALLEGRA PO) Take 180 mg by mouth at bedtime.    Yes [provider]  irbesartan (AVAPRO) 150 MG tablet TAKE 1 TABLET BY MOUTH EVERY DAY. Patient taking differently: Take 150 mg by mouth daily. 04/02/20  Yes Glendale Chard, MD  omeprazole (PRILOSEC) 20 MG capsule TAKE 1 CAPSULE BY MOUTH ONCE DAILY 30 MINUTES TO 1 HOUR BEFORE A MEAL Patient taking differently: Take 20 mg by mouth daily. 08/06/20  Yes Glendale Chard, MD  rosuvastatin (CRESTOR) 20 MG tablet TAKE 1  TABLET BY MOUTH ONCE DAILY Patient taking differently: Take 20 mg by mouth daily. 08/31/20  Yes Glendale Chard, MD  trolamine salicylate (ASPERCREME) 10 % cream Apply 1 application topically daily as needed for muscle pain.   Yes [provider]  vitamin B-12 (CYANOCOBALAMIN) 100 MCG tablet Take 100 mcg by mouth daily.   Yes [provider]  glucose blood (ONETOUCH VERIO) test strip 1 each by Other route as needed for other. Use as instructed to check blood sugar 2 times per day dx: e11.65 10/19/19   Glendale Chard, MD  Lancets [facility] ULTRASOFT) lancets Use as instructed to check blood sugar 2 times per day dx code e11.65 10/19/19   Glendale Chard, MD  meclizine (ANTIVERT) 12.5 MG tablet Take 1 tablet (12.5 mg total) by mouth 3 (three) times daily as needed for dizziness. Patient not taking: No sig reported 06/21/20   Bary Castilla, NP  olopatadine (PATANOL) 0.1 % ophthalmic solution Place 1 drop into both eyes 2 (two) times daily. Patient not taking: Reported on 10/09/2020 06/15/19   Kennith Gain, MD  PAZEO 0.7 % SOLN INSTILL 1 DROP INTO BOTH EYES DAILY. Patient not taking: Reported on 10/09/2020 12/19/19   Kennith Gain,  MD  triamcinolone (KENALOG) 0.1 % APPLY TO THE AFFECTED AREA(S) 2 TIMES DAILY Patient not taking: No sig reported 08/24/20   Glendale Chard, MD    Allergies    Amoxicillin, Ampicillin, and Sulfa antibiotics  Review of Systems   Review of Systems  Constitutional: Negative for chills and fever.  HENT: Negative for ear pain and sore throat.   Eyes: Negative for pain and visual disturbance.  Respiratory: Negative for cough and shortness of breath.   Cardiovascular: Negative for chest pain and palpitations.  Gastrointestinal: Positive for diarrhea, nausea and vomiting. Negative for abdominal pain.  Genitourinary: Negative for dysuria and hematuria.  Musculoskeletal: Negative for arthralgias and back pain.  Skin: Negative for color change and rash.  Neurological: Negative for seizures and syncope.  All other systems reviewed and are negative.   Physical Exam Updated Vital Signs BP (!) 163/85   Pulse (!) 135   Temp 97.7 F (36.5 C) (Oral)   Resp 18   Ht 5\' 5"  (1.651 m)   Wt 62.6 kg   LMP  (LMP Unknown)   SpO2 96%   BMI 22.96 kg/m   Physical Exam Vitals and nursing note reviewed.  Constitutional:      General: She is not in acute distress.    Appearance: She is well-developed and well-nourished. She is not diaphoretic.  HENT:     Head: Normocephalic and atraumatic.     Mouth/Throat:     Mouth: Mucous membranes are moist.     Pharynx: Oropharynx is clear.  Eyes:     Conjunctiva/sclera: Conjunctivae normal.  Cardiovascular:     Rate and Rhythm: Regular rhythm. Tachycardia present.     Pulses: Normal pulses.     Heart sounds: Normal heart sounds. No murmur heard.   Pulmonary:     Effort: Pulmonary effort is normal. No respiratory distress.     Breath sounds: Normal breath sounds.  Abdominal:     Palpations: Abdomen is soft.     Tenderness: There is abdominal tenderness. There is no right CVA tenderness or left CVA tenderness.     Comments: Mild suprapubic and left  lower quadrant tenderness  Musculoskeletal:        General: No edema. Normal range of motion.  Cervical back: Normal range of motion and neck supple.  Skin:    General: Skin is warm and dry.  Neurological:     General: No focal deficit present.     Mental Status: She is alert and oriented to person, place, and time.  Psychiatric:        Mood and Affect: Mood and affect normal.     ED Results / Procedures / Treatments   Labs (all labs ordered are listed, but only abnormal results are displayed) Labs Reviewed  COMPREHENSIVE METABOLIC PANEL - Abnormal; Notable for the following components:      Result Value   Sodium 131 (*)    Potassium >7.5 (*)    CO2 13 (*)    Glucose, Bld 108 (*)    BUN 42 (*)    Creatinine, Ser 1.79 (*)    AST 54 (*)    Total Bilirubin <0.1 (*)    GFR, Estimated 28 (*)    All other components within normal limits  CBC - Abnormal; Notable for the following components:   WBC 13.2 (*)    All other components within normal limits  RESP PANEL BY RT-PCR (FLU A&B, COVID) ARPGX2  LIPASE, BLOOD  URINALYSIS, ROUTINE W REFLEX MICROSCOPIC  COMPREHENSIVE METABOLIC PANEL  BASIC METABOLIC PANEL    EKG EKG Interpretation  Date/Time:  Tuesday October 09 2020 15:31:55 EST Ventricular Rate:  113 PR Interval:    QRS Duration: 79 QT Interval:  391 QTC Calculation: 537 R Axis:   26 Text Interpretation: Sinus or ectopic atrial tachycardia Atrial premature complex Prolonged PR interval Minimal ST depression, lateral leads Prolonged QT interval rate is faster compared to June 2021 Confirmed by Sherwood Gambler [phone]) on 10/09/2020 4:03:07 PM   Radiology CT ABDOMEN PELVIS WO CONTRAST  Result Date: 10/09/2020 CLINICAL DATA:  Left lower quadrant abdominal pain, nausea/vomiting/diarrhea for 3 days EXAM: CT ABDOMEN AND PELVIS WITHOUT CONTRAST TECHNIQUE: Multidetector CT imaging of the abdomen and pelvis was performed following the standard protocol without IV contrast.  COMPARISON:  08/11/2019 FINDINGS: Lower chest: No acute pleural or parenchymal lung disease. Stable hiatal hernia. Unenhanced CT was performed per clinician order. Lack of IV contrast limits sensitivity and specificity, especially for evaluation of abdominal/pelvic solid viscera. Hepatobiliary: No focal liver abnormality is seen. No gallstones, gallbladder wall thickening, or biliary dilatation. Pancreas: Unremarkable. No pancreatic ductal dilatation or surrounding inflammatory changes. Spleen: Normal in size without focal abnormality. Adrenals/Urinary Tract: Stable fat containing left adrenal lesion measuring 2.7 cm consistent with angiomyolipoma. Stable benign cyst lower pole left kidney. No urinary tract calculi or obstructive uropathy within either kidney. Bladder is unremarkable. The adrenals are normal. Stomach/Bowel: No bowel obstruction or ileus. Diverticulosis of the descending and sigmoid colon again identified, with no evidence of acute diverticulitis. Evaluation of the bowel is limited by the lack of intravenous and oral contrast. No obvious bowel wall thickening or inflammatory change. Vascular/Lymphatic: Aortic atherosclerosis. No enlarged abdominal or pelvic lymph nodes. Reproductive: Status post hysterectomy. No adnexal masses. Other: No free fluid or free gas.  No abdominal wall hernia. Musculoskeletal: No acute or destructive bony lesions. Stable degenerative changes of the lumbosacral junction. Reconstructed images demonstrate no additional findings. IMPRESSION: 1. Distal colonic diverticulosis with no evidence of acute diverticulitis. 2. 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 3. Stable hiatal hernia. 4.  Aortic Atherosclerosis (ICD10-I70.0). Electronically Signed   By: Randa Ngo M.D.   On: 10/09/2020 20:14   US RENAL  Result Date: 10/09/2020 CLINICAL  DATA:  Acute renal insufficiency EXAM: RENAL / URINARY TRACT ULTRASOUND COMPLETE COMPARISON:  10/09/2020, 08/11/2019  FINDINGS: Right Kidney: Renal measurements: 9.1 x 4.3 x 5.0 cm = volume: 101.8 mL. Increased renal cortical echotexture. No mass or hydronephrosis visualized. Left Kidney: Renal measurements: 9.4 x 4.5 by 5.1 cm = volume: 112.8 mL. Echogenicity within normal limits. 3.5 cm cyst lower pole left kidney. The left renal angiomyolipoma seen on recent CT not well visualized by ultrasound. No hydronephrosis. Bladder: Appears normal for degree of bladder distention. Other: None. IMPRESSION: 1. Increased renal cortical echotexture right kidney, consistent with medical renal disease. 2. Simple left renal cyst. Left renal angiomyolipoma seen on preceding CT not well visualized on this study. Electronically Signed   By: Randa Ngo M.D.   On: 10/09/2020 20:34    Procedures Procedures   Medications Ordered in ED Medications  sodium zirconium cyclosilicate (LOKELMA) packet 5 g (5 g Oral Given 10/09/20 1741)  0.9 %  sodium chloride infusion ( Intravenous New Bag/Given 10/09/20 2118)  metoprolol tartrate (LOPRESSOR) injection 2.5 mg (has no administration in time range)  sodium chloride 0.9 % bolus 1,000 mL (0 mLs Intravenous Stopped 10/09/20 1728)  insulin aspart (novoLOG) injection 5 Units (5 Units Intravenous Given 10/09/20 1732)    And  dextrose 50 % solution 50 mL (50 mLs Intravenous Given 10/09/20 1732)  albuterol (VENTOLIN HFA) 108 (90 Base) MCG/ACT inhaler 4 puff (4 puffs Inhalation Given 10/09/20 1733)  sodium chloride 0.9 % bolus 1,000 mL (1,000 mLs Intravenous New Bag/Given 10/09/20 1732)  calcium gluconate 1 g/ 50 mL sodium chloride IVPB (1,000 mg Intravenous New Bag/Given 10/09/20 1835)  metoprolol tartrate (LOPRESSOR) injection 5 mg (5 mg Intravenous Given 10/09/20 2117)    ED Course  I have reviewed the triage vital signs and the nursing notes.  Pertinent labs & imaging results that were available during my care of the patient were reviewed by me and considered in my medical decision making (see chart for  details).  Clinical Course as of 10/09/20 2130  Tue Oct 09, 2817  7938 86 year old female presents to the ER with complaints of 3 days of nausea, vomit, diarrhea.  On arrival, vitals overall reassuring, afebrile, though she is slightly tachycardic, suspect due to dehydration.  She denies any chest pain, shortness of breath, abdominal pain, urinary symptoms, no hematochezia.  She does reports feeling overall poorly however.  DDx includes viral gastroenteritis, diverticulitis, bacterial source, small bowel obstruction COVID-19  Plan for basic labs, lipase, UA, COVID test, fluids [MB]  1549 ED EKG EKG with sinus tach and prolonged QTC of 532.  Will hold antiemetics [MB]  1741 Potassium(!!): >7.5 [MB]  1741 Creatinine(!): 1.79 [MB]  1741 GFR, Estimated(!): 28 [MB]  1741 WBC(!): 13.2 [MB]  1741 Critical potassium of 8.2 per nursing staff.  She also has an AKI with a creatinine of 1.79, baseline is normal.  GFR 28.  White count of 13.2, question infection versus due to vomiting.  Initiated hyperkalemia protocol treatment with insulin, albuterol, consulted Dr. Candiss Norse with nephrology who also recommends [facility].  Patient's renal function will not allow contrast, plan for CT abdomen pelvis without contrast, will need admission [MB]  1745 Son [name] updated [MB]  2014 CT ABDOMEN PELVIS WO CONTRAST IMPRESSION: 1. Distal colonic diverticulosis with no evidence of acute diverticulitis. 2. 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 3. Stable hiatal hernia. 4. Aortic Atherosclerosis (ICD10-I70.0).    [MB]  2127 Consulted Dr. Olevia Bowens who will admit the  patient for further evaluation and treatment.  Patient remains mildly hypertensive with a blood pressure 163/85, and tachycardic with a rate in the 130s.  Ordered 2 and half milligrams of metoprolol as cussed with Dr. Olevia Bowens and Dr. Regenia Skeeter.  Stable for admission at this time. [MB]  2129 This was a shared visit with my supervising  physician Dr. Regenia Skeeter who independently saw and evaluated the patient & provided guidance in evaluation/management/disposition ,in agreement with care  [MB]    Clinical Course User Index [MB] Lyndel Safe   MDM Rules/Calculators/A&P                           Final Clinical Impression(s) / ED Diagnoses Final diagnoses:  Hyperkalemia  Acute renal failure, unspecified acute renal failure type ([place])  Nausea vomiting and diarrhea    Rx / DC Orders ED Discharge Orders    None       Lyndel Safe 10/09/20 2130    Sherwood Gambler, MD 10/11/20 [phone]

## 2020-10-09 NOTE — ED Notes (Signed)
Anna Hoffman, [phone], please call

## 2020-10-09 NOTE — ED Notes (Signed)
Placed pt on purewick  

## 2020-10-09 NOTE — Progress Notes (Addendum)
Informed of patient's hyperkalemia  86 year old female with a PMH of DM2, HTN, h/o CVA p/w n/v/d x 3 days. Found to have AKI with a Cr 1.8 and hyperkalemia with a K >7.5 along with a Na of 131, and CO2 of 13 (NAGMA). Received shifting agents, per primary there were no EKG changes. Recommend Lokelma, IV hydration, and serial labs to ensure improvement in K and stability/improvement in Na. Acidosis likely related to diarrhea, monitor for now, should improve as diarrhea improves. Full consult to follow.  Gean Quint, MD [facility]

## 2020-10-10 ENCOUNTER — Ambulatory Visit: Payer: Medicare Other | Admitting: Internal Medicine

## 2020-10-10 DIAGNOSIS — E860 Dehydration: Secondary | ICD-10-CM | POA: Diagnosis not present

## 2020-10-10 DIAGNOSIS — I129 Hypertensive chronic kidney disease with stage 1 through stage 4 chronic kidney disease, or unspecified chronic kidney disease: Secondary | ICD-10-CM | POA: Diagnosis not present

## 2020-10-10 DIAGNOSIS — N189 Chronic kidney disease, unspecified: Secondary | ICD-10-CM | POA: Diagnosis not present

## 2020-10-10 DIAGNOSIS — I700 Atherosclerosis of aorta: Secondary | ICD-10-CM | POA: Diagnosis present

## 2020-10-10 DIAGNOSIS — E1122 Type 2 diabetes mellitus with diabetic chronic kidney disease: Secondary | ICD-10-CM | POA: Diagnosis not present

## 2020-10-10 DIAGNOSIS — K2210 Ulcer of esophagus without bleeding: Secondary | ICD-10-CM | POA: Diagnosis not present

## 2020-10-10 DIAGNOSIS — Z800 Family history of malignant neoplasm of digestive organs: Secondary | ICD-10-CM | POA: Diagnosis not present

## 2020-10-10 DIAGNOSIS — R112 Nausea with vomiting, unspecified: Secondary | ICD-10-CM | POA: Diagnosis not present

## 2020-10-10 DIAGNOSIS — Z808 Family history of malignant neoplasm of other organs or systems: Secondary | ICD-10-CM | POA: Diagnosis not present

## 2020-10-10 DIAGNOSIS — Z86010 Personal history of colonic polyps: Secondary | ICD-10-CM | POA: Diagnosis not present

## 2020-10-10 DIAGNOSIS — E782 Mixed hyperlipidemia: Secondary | ICD-10-CM | POA: Diagnosis not present

## 2020-10-10 DIAGNOSIS — D1771 Benign lipomatous neoplasm of kidney: Secondary | ICD-10-CM | POA: Diagnosis present

## 2020-10-10 DIAGNOSIS — Z20822 Contact with and (suspected) exposure to covid-19: Secondary | ICD-10-CM | POA: Diagnosis not present

## 2020-10-10 DIAGNOSIS — E871 Hypo-osmolality and hyponatremia: Secondary | ICD-10-CM | POA: Diagnosis not present

## 2020-10-10 DIAGNOSIS — Z820 Family history of epilepsy and other diseases of the nervous system: Secondary | ICD-10-CM | POA: Diagnosis not present

## 2020-10-10 DIAGNOSIS — N281 Cyst of kidney, acquired: Secondary | ICD-10-CM | POA: Diagnosis present

## 2020-10-10 DIAGNOSIS — N179 Acute kidney failure, unspecified: Secondary | ICD-10-CM | POA: Diagnosis not present

## 2020-10-10 DIAGNOSIS — R630 Anorexia: Secondary | ICD-10-CM | POA: Diagnosis not present

## 2020-10-10 DIAGNOSIS — Z8673 Personal history of transient ischemic attack (TIA), and cerebral infarction without residual deficits: Secondary | ICD-10-CM | POA: Diagnosis not present

## 2020-10-10 DIAGNOSIS — E1165 Type 2 diabetes mellitus with hyperglycemia: Secondary | ICD-10-CM | POA: Diagnosis not present

## 2020-10-10 DIAGNOSIS — Z8042 Family history of malignant neoplasm of prostate: Secondary | ICD-10-CM | POA: Diagnosis not present

## 2020-10-10 DIAGNOSIS — E872 Acidosis: Secondary | ICD-10-CM | POA: Diagnosis not present

## 2020-10-10 DIAGNOSIS — A084 Viral intestinal infection, unspecified: Secondary | ICD-10-CM | POA: Diagnosis not present

## 2020-10-10 DIAGNOSIS — R1310 Dysphagia, unspecified: Secondary | ICD-10-CM | POA: Diagnosis not present

## 2020-10-10 DIAGNOSIS — N1831 Chronic kidney disease, stage 3a: Secondary | ICD-10-CM | POA: Diagnosis present

## 2020-10-10 DIAGNOSIS — K2100 Gastro-esophageal reflux disease with esophagitis, without bleeding: Secondary | ICD-10-CM | POA: Diagnosis not present

## 2020-10-10 DIAGNOSIS — E875 Hyperkalemia: Secondary | ICD-10-CM | POA: Diagnosis not present

## 2020-10-10 DIAGNOSIS — R9431 Abnormal electrocardiogram [ECG] [EKG]: Secondary | ICD-10-CM | POA: Diagnosis present

## 2020-10-10 DIAGNOSIS — K208 Other esophagitis without bleeding: Secondary | ICD-10-CM | POA: Diagnosis not present

## 2020-10-10 DIAGNOSIS — M19012 Primary osteoarthritis, left shoulder: Secondary | ICD-10-CM | POA: Diagnosis present

## 2020-10-10 LAB — COMPREHENSIVE METABOLIC PANEL
ALT: 16 U/L (ref 0–44)
AST: 20 U/L (ref 15–41)
Albumin: 3.7 g/dL (ref 3.5–5.0)
Alkaline Phosphatase: 52 U/L (ref 38–126)
Anion gap: 9 (ref 5–15)
BUN: 24 mg/dL — ABNORMAL HIGH (ref 8–23)
CO2: 13 mmol/L — ABNORMAL LOW (ref 22–32)
Calcium: 9.1 mg/dL (ref 8.9–10.3)
Chloride: 116 mmol/L — ABNORMAL HIGH (ref 98–111)
Creatinine, Ser: 0.99 mg/dL (ref 0.44–1.00)
GFR, Estimated: 57 mL/min — ABNORMAL LOW (ref >60)
Glucose, Bld: 105 mg/dL — ABNORMAL HIGH (ref 70–99)
Potassium: 4.1 mmol/L (ref 3.5–5.1)
Sodium: 138 mmol/L (ref 135–145)
Total Bilirubin: 0.8 mg/dL (ref 0.3–1.2)
Total Protein: 6.6 g/dL (ref 6.5–8.1)

## 2020-10-10 LAB — URINALYSIS, ROUTINE W REFLEX MICROSCOPIC
Bilirubin Urine: NEGATIVE
Glucose, UA: NEGATIVE mg/dL
Ketones, ur: 5 mg/dL — AB
Leukocytes,Ua: NEGATIVE
Nitrite: NEGATIVE
Protein, ur: NEGATIVE mg/dL
Specific Gravity, Urine: 1.017 (ref 1.005–1.030)
pH: 5 (ref 5.0–8.0)

## 2020-10-10 LAB — CBC
HCT: 39.9 % (ref 36.0–46.0)
Hemoglobin: 13.1 g/dL (ref 12.0–15.0)
MCH: 30.2 pg (ref 26.0–34.0)
MCHC: 32.8 g/dL (ref 30.0–36.0)
MCV: 91.9 fL (ref 80.0–100.0)
Platelets: 331 10*3/uL (ref 150–400)
RBC: 4.34 MIL/uL (ref 3.87–5.11)
RDW: 14.8 % (ref 11.5–15.5)
WBC: 19.5 10*3/uL — ABNORMAL HIGH (ref 4.0–10.5)
nRBC: 0 % (ref 0.0–0.2)

## 2020-10-10 LAB — GLUCOSE, CAPILLARY
Glucose-Capillary: 98 mg/dL (ref 70–99)
Glucose-Capillary: 100 mg/dL — ABNORMAL HIGH (ref 70–99)
Glucose-Capillary: 89 mg/dL (ref 70–99)
Glucose-Capillary: 98 mg/dL (ref 70–99)
Glucose-Capillary: 105 mg/dL — ABNORMAL HIGH (ref 70–99)

## 2020-10-10 LAB — HEMOGLOBIN A1C
Hgb A1c MFr Bld: 6.7 % — ABNORMAL HIGH (ref 4.8–5.6)
Mean Plasma Glucose: 145.59 mg/dL

## 2020-10-10 LAB — MAGNESIUM: Magnesium: 2 mg/dL (ref 1.7–2.4)

## 2020-10-10 MED ORDER — ASPIRIN EC 81 MG PO TBEC
81.0000 mg | DELAYED_RELEASE_TABLET | Freq: Every day | ORAL | Status: DC
  Administered 2020-10-10 – 2020-10-13 (×3): 81 mg via ORAL
  Filled 2020-10-10 (×4): qty 1

## 2020-10-10 MED ORDER — SODIUM CHLORIDE 0.9 % IV SOLN: INTRAVENOUS | Status: DC

## 2020-10-10 MED ORDER — ASCORBIC ACID 500 MG PO TABS
1000.0000 mg | ORAL_TABLET | Freq: Every day | ORAL | Status: DC
  Administered 2020-10-10 – 2020-10-13 (×4): 1000 mg via ORAL
  Filled 2020-10-10 (×4): qty 2

## 2020-10-10 MED ORDER — AZELASTINE HCL 0.1 % NA SOLN: 1.0000 | Freq: Every day | NASAL | Status: DC | PRN

## 2020-10-10 MED ORDER — SODIUM BICARBONATE 650 MG PO TABS
1300.0000 mg | ORAL_TABLET | Freq: Two times a day (BID) | ORAL | Status: DC
  Administered 2020-10-10 – 2020-10-13 (×6): 1300 mg via ORAL
  Filled 2020-10-10 (×6): qty 2

## 2020-10-10 MED ORDER — LORATADINE 10 MG PO TABS
10.0000 mg | ORAL_TABLET | Freq: Every day | ORAL | Status: DC
  Administered 2020-10-10 – 2020-10-13 (×4): 10 mg via ORAL
  Filled 2020-10-10 (×4): qty 1

## 2020-10-10 MED ORDER — POLYVINYL ALCOHOL 1.4 % OP SOLN
1.0000 [drp] | Freq: Three times a day (TID) | OPHTHALMIC | Status: DC | PRN
  Filled 2020-10-10: qty 15

## 2020-10-10 MED ORDER — PANTOPRAZOLE SODIUM 40 MG PO TBEC
40.0000 mg | DELAYED_RELEASE_TABLET | Freq: Every day | ORAL | Status: DC
  Administered 2020-10-10 – 2020-10-12 (×3): 40 mg via ORAL
  Filled 2020-10-10 (×3): qty 1

## 2020-10-10 MED ORDER — ROSUVASTATIN CALCIUM 20 MG PO TABS
20.0000 mg | ORAL_TABLET | Freq: Every day | ORAL | Status: DC
  Administered 2020-10-10 – 2020-10-13 (×4): 20 mg via ORAL
  Filled 2020-10-10 (×4): qty 1

## 2020-10-10 MED ORDER — ENOXAPARIN SODIUM 40 MG/0.4ML ~~LOC~~ SOLN
40.0000 mg | SUBCUTANEOUS | Status: DC
  Administered 2020-10-10 – 2020-10-13 (×3): 40 mg via SUBCUTANEOUS
  Filled 2020-10-10 (×4): qty 0.4

## 2020-10-10 MED ORDER — AMLODIPINE BESYLATE 10 MG PO TABS
10.0000 mg | ORAL_TABLET | Freq: Every day | ORAL | Status: DC
  Administered 2020-10-10 – 2020-10-13 (×4): 10 mg via ORAL
  Filled 2020-10-10 (×4): qty 1

## 2020-10-10 MED ORDER — METOPROLOL TARTRATE 5 MG/5ML IV SOLN
5.0000 mg | Freq: Once | INTRAVENOUS | Status: AC
  Administered 2020-10-10: 5 mg via INTRAVENOUS
  Filled 2020-10-10: qty 5

## 2020-10-10 MED ORDER — CARVEDILOL 3.125 MG PO TABS
3.1250 mg | ORAL_TABLET | Freq: Two times a day (BID) | ORAL | Status: DC
  Administered 2020-10-10 – 2020-10-13 (×7): 3.125 mg via ORAL
  Filled 2020-10-10 (×7): qty 1

## 2020-10-10 MED ORDER — BENZONATATE 100 MG PO CAPS: 100.0000 mg | ORAL_CAPSULE | Freq: Three times a day (TID) | ORAL | Status: DC | PRN

## 2020-10-10 MED ORDER — VITAMIN B-12 100 MCG PO TABS
100.0000 ug | ORAL_TABLET | Freq: Every day | ORAL | Status: DC
  Administered 2020-10-10 – 2020-10-13 (×4): 100 ug via ORAL
  Filled 2020-10-10 (×4): qty 1

## 2020-10-10 NOTE — Care Management Obs Status (Signed)
[facility] NOTIFICATION   Patient Details  Name: Anna Hoffman MRN: [id number] Date of Birth: May 12, 1935   Medicare Observation Status Notification Given:  Yes    Shade Flood, LCSW 10/10/2020, 2:23 PM

## 2020-10-10 NOTE — Progress Notes (Signed)
PROGRESS NOTE   Anna Hoffman  VOJ:[id number]    DOB: [birth year]    DOA: 10/09/2020  PCP: Glendale Chard, MD   I have briefly reviewed patients previous medical records in [facility].  Chief Complaint  Patient presents with  . Emesis  . Diarrhea    Brief Narrative:  86 year old female with medical history including but not limited to osteoarthritis, type II DM, HTN, hypokalemia, pneumonia, varicella-zoster, thalamic hemorrhagic stroke who presented to the ED due to nausea, vomiting and diarrhea of 3 days duration.  She was admitted for acute viral gastroenteritis with associated dehydration with hyponatremia, acute kidney injury, hyperkalemia, non-anion gap metabolic acidosis.  Nephrology consulted.  AKI and hyperkalemia resolved.  Persisting NAG metabolic acidosis.   Assessment & Plan:  Principal Problem:   Acute renal failure superimposed on stage 3a chronic kidney disease ([name]) Active Problems:   Hypertension   Gastroenteritis   Type 2 diabetes mellitus with stage 3 chronic kidney disease, without long-term current use of insulin ([name])   Mixed hyperlipidemia   Aortic atherosclerosis ([name])   Hyperkalemia   Prolonged QT interval   Hyponatremia   Acute kidney injury complicating stage IIIa CKD  Creatinine 1.08 in November 2021.  Presented with creatinine of 1.79, sodium 131, potassium greater than 7.5, bicarbonate 13 anion gap of 9.  BUN 42.  Likely precipitated by acute viral gastroenteritis, dehydration and ongoing ARB use.  CT abdomen and pelvis without contrast: No acute findings.  Renal ultrasound: Increased right cortical echotexture right kidney consistent with medical renal disease.  ARB held.  Treated with IV fluids.  Creatinine has normalized.  Nephrology follow-up appreciated.  I discussed with Dr. Melvia Heaps, recommends continuing IV fluids for at least another day until eating well, avoid ACEI/ARB for 2 to 4 weeks, no renal follow-up recommended.  He also  recommends oral bicarbonate 650 twice daily for 5 to 7 days.  Hyperkalemia  Secondary to AKI and ARB use  Treated with IV fluid bolus, albuterol inhalations, insulin dextrose, calcium gluconate and Lokelma  Resolved.  Continuing to hold ARB  NAG metabolic acidosis  Bicarbonate persistently low at 13 and anion gap is normal.  Likely secondary to recent GI losses.  As discussed with nephrology, continue IV fluids and add oral bicarbonate.  Follow BMP in a.m.  Dehydration with hyponatremia  Improved.  Continue gentle IV fluids for additional 24 hours until ensure that she is taking orally well.  Leukocytosis  Suspect stress response.  Follow CBCs.  Essential hypertension:  Irbesartan held due to AKI.  Continue amlodipine 10 mg daily and carvedilol 3.125 mg twice daily  Reasonable control.  Acute viral GE  Improved.  Supportive treatment.  Type II DM with stage III CKD without long-term insulin use  A1c 6.7 suggests good outpatient control.  SSI.  Dyslipidemia  Continue Crestor.  Aortic atherosclerosis  Continue Crestor.  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  Outpatient follow-up as deemed necessary.  Body mass index is 22.01 kg/m.   DVT prophylaxis: enoxaparin (LOVENOX) injection 40 mg Start: 10/10/20 1000     Code Status: Full Code Family Communication: None at bedside. Disposition:  Status is: Observation  The patient will require care spanning > 2 midnights and should be moved to inpatient because: IV treatments appropriate due to intensity of illness or inability to take PO  Dispo: The patient is from: Home  Anticipated d/c is to: Home              Patient currently is not medically stable to d/c.   Difficult to place patient No        Consultants:   Nephrology-signed off 3/9  Procedures:   None  Antimicrobials:    Anti-infectives (From admission,  onward)   None        Subjective:  Feels better.  Had an episode of nonbloody emesis last night.  Denies abdominal pain or nausea.  Reports mild headache.  Objective:   Vitals:   10/10/20 0152 10/10/20 0155 10/10/20 0510 10/10/20 1242  BP:  (!) 159/88 (!) 154/90 101/66  Pulse:  (!) 108 99 94  Resp:  16 18 18   Temp:  99 F (37.2 C) 98.4 F (36.9 C) 97.9 F (36.6 C)  TempSrc:  Oral Oral Oral  SpO2:  100% 95% 94%  Weight: 63.7 kg     Height: 5\' 7"  (1.702 m)       General exam: Elderly female, moderately built and thinly nourished sitting up comfortably in chair this morning.  Oral mucosa dry. Respiratory system: Clear to auscultation. Respiratory effort normal. Cardiovascular system: S1 & S2 heard, RRR. No JVD, murmurs, rubs, gallops or clicks. No pedal edema.  Telemetry personally reviewed: Sinus tachycardia in the 100s. Gastrointestinal system: Abdomen is nondistended, soft and nontender. No organomegaly or masses felt. Normal bowel sounds heard. Central nervous system: Alert and oriented. No focal neurological deficits. Extremities: Symmetric 5 x 5 power. Skin: No rashes, lesions or ulcers Psychiatry: Judgement and insight appear normal. Mood & affect appropriate.     Data Reviewed:   I have personally reviewed following labs and imaging studies   CBC: Recent Labs  Lab 10/09/20 1514 10/10/20 0411  WBC 13.2* 19.5*  HGB 14.8 13.1  HCT 45.9 39.9  MCV 92.2 91.9  PLT 372 601    Basic Metabolic Panel: Recent Labs  Lab 10/09/20 1514 10/09/20 1932 10/10/20 0411  NA 131* 137 138  K >7.5* 5.2* 4.1  CL 109 116* 116*  CO2 13* 13* 13*  GLUCOSE 108* 113* 105*  BUN 42* 33* 24*  CREATININE 1.79* 1.18* 0.99  CALCIUM 9.3 9.3 9.1    Liver Function Tests: Recent Labs  Lab 10/09/20 1514 10/09/20 1932 10/10/20 0411  AST 54* 30 20  ALT 28 16 16   ALKPHOS 60 55 52  BILITOT <0.1* 1.8* 0.8  PROT 7.6 7.2 6.6  ALBUMIN 4.2 4.0 3.7    CBG: Recent Labs  Lab  10/10/20 0736 10/10/20 1200 10/10/20 1251  GLUCAP 98 100* 89    Microbiology Studies:   Recent Results (from the past 240 hour(s))  Resp Panel by RT-PCR (Flu A&B, Covid) Nasopharyngeal Swab     Status: None   Collection Time: 10/09/20  3:32 PM   Specimen: Nasopharyngeal Swab; Nasopharyngeal(NP) swabs in vial transport medium  Result Value Ref Range Status   SARS Coronavirus 2 by RT PCR NEGATIVE NEGATIVE Final    Comment: (NOTE) SARS-CoV-2 target nucleic acids are NOT DETECTED.  The SARS-CoV-2 RNA is generally detectable in upper respiratory specimens during the acute phase of infection. The lowest concentration of SARS-CoV-2 viral copies this assay can detect is 138 copies/mL. A negative result does not preclude SARS-Cov-2 infection and should not be used as the sole basis for treatment or other patient management decisions. A negative result may occur with  improper specimen collection/handling, submission of specimen other than nasopharyngeal swab, presence  of viral mutation(s) within the areas targeted by this assay, and inadequate number of viral copies(<138 copies/mL). A negative result must be combined with clinical observations, patient history, and epidemiological information. The expected result is Negative.  Fact Sheet for Patients:  EntrepreneurPulse.com.au  Fact Sheet for Healthcare Providers:  IncredibleEmployment.be  This test is no t yet approved or cleared by the [place] FDA and  has been authorized for detection and/or diagnosis of SARS-CoV-2 by FDA under an Emergency Use Authorization (EUA). This EUA will remain  in effect (meaning this test can be used) for the duration of the COVID-19 declaration under Section 564(b)(1) of the Act, 21 U.S.C.section 360bbb-3(b)(1), unless the authorization is terminated  or revoked sooner.       Influenza A by PCR NEGATIVE NEGATIVE Final   Influenza B by PCR NEGATIVE NEGATIVE  Final    Comment: (NOTE) The Xpert Xpress SARS-CoV-2/FLU/RSV plus assay is intended as an aid in the diagnosis of influenza from Nasopharyngeal swab specimens and should not be used as a sole basis for treatment. Nasal washings and aspirates are unacceptable for Xpert Xpress SARS-CoV-2/FLU/RSV testing.  Fact Sheet for Patients: EntrepreneurPulse.com.au  Fact Sheet for Healthcare Providers: IncredibleEmployment.be  This test is not yet approved or cleared by the [place] FDA and has been authorized for detection and/or diagnosis of SARS-CoV-2 by FDA under an Emergency Use Authorization (EUA). This EUA will remain in effect (meaning this test can be used) for the duration of the COVID-19 declaration under Section 564(b)(1) of the Act, 21 U.S.C. section 360bbb-3(b)(1), unless the authorization is terminated or revoked.  Performed at [facility], [place] [address] [place], [place] [address]      Radiology Studies:  CT ABDOMEN PELVIS WO CONTRAST  Result Date: 10/09/2020 CLINICAL DATA:  Left lower quadrant abdominal pain, nausea/vomiting/diarrhea for 3 days EXAM: CT ABDOMEN AND PELVIS WITHOUT CONTRAST TECHNIQUE: Multidetector CT imaging of the abdomen and pelvis was performed following the standard protocol without IV contrast. COMPARISON:  08/11/2019 FINDINGS: Lower chest: No acute pleural or parenchymal lung disease. Stable hiatal hernia. Unenhanced CT was performed per clinician order. Lack of IV contrast limits sensitivity and specificity, especially for evaluation of abdominal/pelvic solid viscera. Hepatobiliary: No focal liver abnormality is seen. No gallstones, gallbladder wall thickening, or biliary dilatation. Pancreas: Unremarkable. No pancreatic ductal dilatation or surrounding inflammatory changes. Spleen: Normal in size without focal abnormality. Adrenals/Urinary Tract: Stable fat containing left adrenal lesion measuring  2.7 cm consistent with angiomyolipoma. Stable benign cyst lower pole left kidney. No urinary tract calculi or obstructive uropathy within either kidney. Bladder is unremarkable. The adrenals are normal. Stomach/Bowel: No bowel obstruction or ileus. Diverticulosis of the descending and sigmoid colon again identified, with no evidence of acute diverticulitis. Evaluation of the bowel is limited by the lack of intravenous and oral contrast. No obvious bowel wall thickening or inflammatory change. Vascular/Lymphatic: Aortic atherosclerosis. No enlarged abdominal or pelvic lymph nodes. Reproductive: Status post hysterectomy. No adnexal masses. Other: No free fluid or free gas.  No abdominal wall hernia. Musculoskeletal: No acute or destructive bony lesions. Stable degenerative changes of the lumbosacral junction. Reconstructed images demonstrate no additional findings. IMPRESSION: 1. Distal colonic diverticulosis with no evidence of acute diverticulitis. 2. 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 3. Stable hiatal hernia. 4.  Aortic Atherosclerosis (ICD10-I70.0). Electronically Signed   By: Randa Ngo M.D.   On: 10/09/2020 20:14   US RENAL  Result Date: 10/09/2020 CLINICAL DATA:  Acute renal insufficiency  EXAM: RENAL / URINARY TRACT ULTRASOUND COMPLETE COMPARISON:  10/09/2020, 08/11/2019 FINDINGS: Right Kidney: Renal measurements: 9.1 x 4.3 x 5.0 cm = volume: 101.8 mL. Increased renal cortical echotexture. No mass or hydronephrosis visualized. Left Kidney: Renal measurements: 9.4 x 4.5 by 5.1 cm = volume: 112.8 mL. Echogenicity within normal limits. 3.5 cm cyst lower pole left kidney. The left renal angiomyolipoma seen on recent CT not well visualized by ultrasound. No hydronephrosis. Bladder: Appears normal for degree of bladder distention. Other: None. IMPRESSION: 1. Increased renal cortical echotexture right kidney, consistent with medical renal disease. 2. Simple left renal cyst. Left renal  angiomyolipoma seen on preceding CT not well visualized on this study. Electronically Signed   By: Randa Ngo M.D.   On: 10/09/2020 20:34     Scheduled Meds:   . amLODipine  10 mg Oral Daily  . vitamin C  1,000 mg Oral Daily  . aspirin EC  81 mg Oral Daily  . carvedilol  3.125 mg Oral BID WC  . enoxaparin (LOVENOX) injection  40 mg Subcutaneous Q24H  . insulin aspart  0-9 Units Subcutaneous TID WC  . loratadine  10 mg Oral Daily  . pantoprazole  40 mg Oral Daily  . rosuvastatin  20 mg Oral Daily  . vitamin B-12  100 mcg Oral Daily    Continuous Infusions:     LOS: 0 days     Vernell Leep, MD, [place], [facility]. [facility]    To contact the attending provider between 7A-7P or the covering provider during after hours 7P-7A, please log into the web site [email or web address] and access using universal [place] password for that web site. If you do not have the password, please call the hospital operator.  10/10/2020, 3:24 PM

## 2020-10-10 NOTE — Plan of Care (Signed)

## 2020-10-10 NOTE — TOC Initial Note (Signed)
Transition of Care [facility]) - Initial/Assessment Note    Patient Details  Name: Anna Hoffman MRN: [id number] Date of Birth: February 20, 1935  Transition of [facility]) CM/SW Contact:    Shade Flood, LCSW Phone Number: 10/10/2020, 3:08 PM  Clinical Narrative:                  Pt admitted from home. PT/OT recommending HH at dc. Met with pt to assess. Per pt, she lives with her son at home. She has a cane and a RW for DME. Pt's son is home during the day and works night shift. Pt is agreeable to [facility] at dc. Discussed [place] provider options and pt stated that she has had [place] from [facility] in the past and she would be agreeable to them or any other agency that would accept her insurance.  Referral made to [facility] at [facility] but they were unable to accept. Referral then made to [facility] at [facility] and he accepted pt. Updated pt's son, Anna Hoffman, by phone and he expresses agreement to this plan. He will update pt as to why [facility] Lemmings will see her rather than [facility].  Assigned TOC will follow and assist if any other dc needs arise.   Expected Discharge Plan: [place] Barriers to Discharge: Continued Medical Work up   Patient Goals and CMS Choice Patient states their goals for this hospitalization and ongoing recovery are:: go home CMS Medicare.gov Compare Post Acute Care list provided to:: Patient Choice offered to / list presented to : Patient  Expected Discharge Plan and Services Expected Discharge Plan: [place] In-house Referral: Clinical Social Work   Post Acute Care Choice: [place] arrangements for the past 2 months: [place]: PT,OT [place]: [place] Date [place]: 10/10/20   Representative spoke with at [place]: Tommi Rumps  Prior Living Arrangements/Services Living arrangements for the past 2 months: [place] Lives with:: Adult Children Patient language and need for  interpreter reviewed:: Yes Do you feel safe going back to the place where you live?: Yes      Need for Family Participation in Patient Care: Yes (Comment) Care giver support system in place?: Yes (comment) Current home services: DME Criminal Activity/Legal Involvement Pertinent to Current Situation/Hospitalization: No - Comment as needed  Activities of Daily Living Home Assistive Devices/Equipment: Cane (specify quad or straight),CBG Meter,Eyeglasses ADL Screening (condition at time of admission) Patient's cognitive ability adequate to safely complete daily activities?: Yes Is the patient deaf or have difficulty hearing?: No Does the patient have difficulty seeing, even when wearing glasses/contacts?: No Does the patient have difficulty concentrating, remembering, or making decisions?: No Patient able to express need for assistance with ADLs?: Yes Does the patient have difficulty dressing or bathing?: No Independently performs ADLs?: Yes (appropriate for developmental age) Does the patient have difficulty walking or climbing stairs?: No Weakness of Legs: Both Weakness of Arms/Hands: None  Permission Sought/Granted Permission sought to share information with : Facility Art therapist granted to share information with : Yes, Verbal Permission Granted     Permission granted to share info w AGENCY: HH        Emotional Assessment Appearance:: Appears younger than stated age Attitude/Demeanor/Rapport: Engaged Affect (typically observed): Pleasant Orientation: : Oriented to Self,Oriented to Place,Oriented to  Time,Oriented to Situation Alcohol / Substance Use: Not Applicable Psych Involvement: No (comment)  Admission diagnosis:  Hyperkalemia [E87.5] AKI (acute kidney injury) ([place]) [N17.9] Nausea vomiting and diarrhea [R11.2, R19.7] Acute renal failure, unspecified acute renal failure type ([place]) [N17.9] Acute renal failure superimposed on stage 3a chronic kidney  disease ([place]) [N17.9, N18.31] Patient Active Problem List   Diagnosis Date Noted  . Hyponatremia 10/10/2020  . Aortic atherosclerosis ([place]) 10/09/2020  . Acute renal failure superimposed on stage 3a chronic kidney disease ([place]) 10/09/2020  . Hyperkalemia 10/09/2020  . Hearing loss 10/09/2020  . Prolonged QT interval 10/09/2020  . Colitis, acute 08/11/2019  . Colitis 07/11/2019  . Loose stools 06/07/2019  . Herpes zoster without complication 19/37/9024  . Other long term (current) drug therapy 08/12/2018  . Chronic renal disease, stage III ([place]) 07/19/2018  . Hypertensive nephropathy 07/19/2018  . Community acquired pneumonia of right upper lobe of lung 11/07/2017  . Hypertensive emergency 07/01/2016  . Thalamic hemorrhage ([place]) 07/01/2016  . 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[place])   . Benign essential HTN   . Type 2 diabetes mellitus with stage 3 chronic kidney disease, without long-term current use of insulin ([place])   . Mixed hyperlipidemia   . ICH (intracerebral hemorrhage) ([place]) - hypertensive R thalamic hemorrhage  06/27/2016  . 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 Retroperitoneal bleed 02/08/2016  . Generalized abdominal pain   . Gastroenteritis 02/04/2016  . Diarrhea 02/04/2016  . Hypokalemia 02/04/2016  . Incarcerated paraesophageal hernia 09/02/2014  . Renal mass, left 09/02/2014  . Acute esophagitis 09/02/2014  . Gastric outlet obstruction 09/02/2014  . Hypertension   . Vomiting 09/01/2014   PCP:  Glendale Chard, MD Pharmacy:   [place], [place] - [address] [facility] [address] [place] [address] Phone: [phone] Fax: [phone]     Social Determinants of Health (SDOH) Interventions    Readmission Risk Interventions No flowsheet data found.

## 2020-10-10 NOTE — Progress Notes (Addendum)
EKG complete as ordered, Critical Result QTc prolonged 589 ms. MD updated. SRP, RN

## 2020-10-10 NOTE — Progress Notes (Signed)
[facility] Progress Note  Subjective: seen in room, vomiting. No new c/o. Creat wnl and K < 5.0 this am.   Vitals:   10/10/20 0130 10/10/20 0152 10/10/20 0155 10/10/20 0510  BP:   (!) 159/88 (!) 154/90  Pulse: (!) 107  (!) 108 99  Resp: 17  16 18   Temp:   99 F (37.2 C) 98.4 F (36.9 C)  TempSrc:   Oral Oral  SpO2: 97%  100% 95%  Weight:  63.7 kg    Height:  5\' 7"  (1.702 m)      Exam:  alert, nad   no jvd  Chest cta bilat  Cor reg no RG  Abd soft ntnd no ascites   Ext no LE edema   Alert, NF, ox3     UA 3/8- many bact, 0-5 rbc, 11-20 wbc   Renal US - no hydro either side, 2 kidneys present   Assessment/ Plan: 1. AKI - due to vol depletion (N/V/D) in setting of ARB use.  Creat back to normal. Would cont IVF"s until eating well. Avoid ACEi/ ARB for 2-4 weeks. Will sign off. No renal f/u needed.  2. Hyperkalemia - resolved 3. HTN - cont norvasc, use coreg if needed 4. N/V/D - per primary team 5. Hyponatremia - stable     Rob Jamie Belger 10/10/2020, 12:33 PM   Recent Labs  Lab 10/09/20 1514 10/09/20 1932 10/10/20 0411  K >7.5* 5.2* 4.1  BUN 42* 33* 24*  CREATININE 1.79* 1.18* 0.99  CALCIUM 9.3 9.3 9.1  HGB 14.8  --  13.1   Inpatient medications: . amLODipine  10 mg Oral Daily  . vitamin C  1,000 mg Oral Daily  . aspirin EC  81 mg Oral Daily  . carvedilol  3.125 mg Oral BID WC  . enoxaparin (LOVENOX) injection  40 mg Subcutaneous Q24H  . insulin aspart  0-9 Units Subcutaneous TID WC  . loratadine  10 mg Oral Daily  . pantoprazole  40 mg Oral Daily  . rosuvastatin  20 mg Oral Daily  . vitamin B-12  100 mcg Oral Daily    acetaminophen **OR** acetaminophen, azelastine, benzonatate, polyvinyl alcohol, prochlorperazine

## 2020-10-10 NOTE — Progress Notes (Signed)
Pt had several episodes of nausea. Pt did not keep breakfast down, became nauseated, Compazine given as ordered. Pt had sips for lunch and eating small bites for dinner. Son at beside. Concerned about intake, MD updated. SRP, RN

## 2020-10-10 NOTE — Evaluation (Signed)
Occupational Therapy Evaluation Patient Details Name: Anna Hoffman MRN: [id number] DOB: 02-01-1935 Today's Date: 10/10/2020    History of Present Illness 86 y.o. female with PMH significant of osteoarthritis, type II DM, hypertension, hypokalemia, history of pneumonia, history of varicella-zoster, history of thalamic hemorrhagic stroke who is coming to the [facility] due to nausea, vomiting and diarrhea for the past 3 days   Clinical Impression   Patient lives with her son in a single level house, is independent at baseline with self care and uses quad cane for ambulation. Patient reports she has exercise equipment at home she uses when feeling good such as stationary bike. Patient's son works at night. Currently patient with decreased strength, balance and overall activity tolerance needing min A for functional ambulation and transfers. Patient reaching out for furniture when using quad cane, transition to rolling walker when ambulating back from bathroom with improved stability however patient pushing walker too far forward. Pt also needing two attempts and min A to power up to standing from toilet. Recommend continued acute OT services to maximize patient safety and independence with self care in order to facilitate D/C to venue listed below.     Follow Up Recommendations  Home health OT;Supervision/Assistance - 24 hour;Other (comment) (vs SNF if initial 24/7 cannot be provided)    Equipment Recommendations  None recommended by OT       Precautions / Restrictions Precautions Precautions: Fall Restrictions Weight Bearing Restrictions: No      Mobility Bed Mobility Overal bed mobility: Needs Assistance Bed Mobility: Supine to Sit     Supine to sit: Min guard     General bed mobility comments: for safety    Transfers Overall transfer level: Needs assistance Equipment used: Rolling walker (2 wheeled);Quad cane Transfers: Sit to/from Stand Sit to Stand: [employer] transfer comment: decreased stability with quad cane which pt uses at baseline, still needing min A with rolling walker due to pushing walker too far forward. pt needed two attempts and min A to stand from toilet    Balance Overall balance assessment: Needs assistance Sitting-balance support: Feet supported Sitting balance-Leahy Scale: Good     Standing balance support: Single extremity supported Standing balance-Leahy Scale: Poor Standing balance comment: reliant on UE support and min A from OT                           ADL either performed or assessed with clinical judgement   ADL Overall ADL's : Needs assistance/impaired     Grooming: Wash/dry hands;Min guard;Standing   Upper Body Bathing: Set up;Sitting   Lower Body Bathing: Minimal assistance;Sit to/from stand   Upper Body Dressing : Set up;Sitting   Lower Body Dressing: Minimal assistance;Sitting/lateral leans;Sit to/from stand Lower Body Dressing Details (indicate cue type and reason): patient able to doff soiled socks and needed min A to complete donning R sock due to fatigue Toilet Transfer: Minimal assistance;Ambulation (BSC over toilet, quad cane) Toilet Transfer Details (indicate cue type and reason): decreased stability with quad cane, pt reaching out for furniture. min A for safety. with ambulating to bathroom patient incontinent of urine, reports she wears briefs sometimes at home. Upon trying to stand from toilet patient unable to fully extend hips and had to sit back down onto toilet. Min A on second attempt to power up to standing Toileting- Clothing Manipulation and Hygiene: Supervision/safety;Sitting/lateral lean Toileting - Clothing Manipulation Details (  indicate cue type and reason): for peri care after voiding     Functional mobility during ADLs: Minimal assistance;Cueing for safety;Cane;Rolling walker General ADL Comments: had patient use rolling walker for ambulation from bathroom to  recliner with improved stability however patient still needing min A as she pushes walker too far in front of her.                  Pertinent Vitals/Pain Pain Assessment: Faces Faces Pain Scale: No hurt     Hand Dominance Right   Extremity/Trunk Assessment Upper Extremity Assessment Upper Extremity Assessment: Generalized weakness   Lower Extremity Assessment Lower Extremity Assessment: Defer to PT evaluation   Cervical / Trunk Assessment Cervical / Trunk Assessment: Normal   Communication Communication Communication: HOH   Cognition Arousal/Alertness: Awake/alert Behavior During Therapy: WFL for tasks assessed/performed Overall Cognitive Status: No family/caregiver present to determine baseline cognitive functioning                                 General Comments: patient appears a little overwhelmed when asked if she can order her own lunch, states "whenever I come here I need someone..."              Home Living Family/patient expects to be discharged to:: Private residence Living Arrangements: Children Available Help at Discharge: Family (son works at night) Type of Home: House Home Access: Level entry     [place]: One level     Bathroom Shower/Tub: Teacher, early years/pre:  (commode over toilet)     Home Equipment: Shower seat;Bedside commode;Grab bars - tub/shower;Walker - 2 wheels;Cane - quad          Prior Functioning/Environment Level of Independence: Independent with assistive device(s)                 OT Problem List: Decreased strength;Decreased activity tolerance;Impaired balance (sitting and/or standing);Decreased safety awareness      OT Treatment/Interventions: Self-care/ADL training;Therapeutic exercise;DME and/or AE instruction;Therapeutic activities;Patient/family education;Balance training    OT Goals(Current goals can be found in the care plan section) Acute Rehab OT Goals Patient Stated  Goal: eat lunch OT Goal Formulation: With patient Time For Goal Achievement: 10/24/20 Potential to Achieve Goals: Good  OT Frequency: Min 2X/week    AM-PAC OT "6 Clicks" Daily Activity     Outcome Measure Help from another person eating meals?: None Help from another person taking care of personal grooming?: A Little Help from another person toileting, which includes using toliet, bedpan, or urinal?: A Little Help from another person bathing (including washing, rinsing, drying)?: A Little Help from another person to put on and taking off regular upper body clothing?: A Little Help from another person to put on and taking off regular lower body clothing?: A Little 6 Click Score: 19   End of Session Equipment Utilized During Treatment: Rolling walker Nurse Communication: Mobility status  Activity Tolerance: Patient tolerated treatment well Patient left: in chair;with call bell/phone within reach;with chair alarm set  OT Visit Diagnosis: Unsteadiness on feet (R26.81);Muscle weakness (generalized) (M62.81)                Time: 3810-1751 OT Time Calculation (min): 39 min Charges:  OT General Charges $OT Visit: 1 Visit OT Evaluation $OT Eval Low Complexity: 1 Low OT Treatments $Self Care/Home Management : 23-37 mins  Delbert Phenix OT OT pager: [place] 10/10/2020,  1:56 PM

## 2020-10-10 NOTE — Evaluation (Signed)
Physical Therapy Evaluation Patient Details Name: Joci Dress MRN: [id number] DOB: 1935-06-09 Today's Date: 10/10/2020   History of Present Illness  86 y.o. female with PMH significant of osteoarthritis, type II DM, hypertension, hypokalemia, history of pneumonia, history of varicella-zoster, history of thalamic hemorrhagic stroke who is coming to the [facility] due to nausea, vomiting and diarrhea for the past 3 days  Clinical Impression  Pt admitted with above diagnosis. Pt currently requiring min A with transfers and ambulation, attempted mobility initially with quad cane due to baseline, but pt noted to be unsteady and reaching for furniture so switched to RW with improved stability. PTA, pt independent with quad cane, living with son who completed cleaning, pt independent with bathing and dressing. Pt did endorse 2 falls in last 6 months, but able to get off of floor by pulling on furniture. Pt lives with son who is present during the day and works nights, also states her other son visits often. Recommend HHPT and 24 hr assist at this time to decrease risk for falls and return to PLOF and pt in agreement. Pt currently with functional limitations due to the deficits listed below (see PT Problem List). Pt will benefit from skilled PT to increase their independence and safety with mobility to allow discharge to the venue listed below.       Follow Up Recommendations Home health PT;Supervision/Assistance - 24 hour    Equipment Recommendations  None recommended by PT    Recommendations for Other Services       Precautions / Restrictions Precautions Precautions: Fall Restrictions Weight Bearing Restrictions: No      Mobility  Bed Mobility Overal bed mobility: Needs Assistance Bed Mobility: Supine to Sit;Sit to Supine  Supine to sit: Supervision Sit to supine: Supervision   General bed mobility comments: increased time, use of bedrail to assist in trunk uprighting    Transfers Overall  transfer level: Needs assistance Equipment used: Rolling walker (2 wheeled);Quad cane Transfers: Sit to/from Stand Sit to Stand: Landscape architect transfer comment: min A with initial transfer and pt noted to be unsteady, improved min G with RW requiring verbal cues for hand placement  Ambulation/Gait Ambulation/Gait assistance: Min assist;Min guard Gait Distance (Feet): 30 Feet Assistive device: Rolling walker (2 wheeled) Gait Pattern/deviations: Step-through pattern;Decreased stride length;Trunk flexed;Wide base of support Gait velocity: decreased   General Gait Details: initial 3 steps with quad cane and pt noted to be unsteady and reaching for furniture with other hand so switched to RW, pt ambulates around room completing turns, direction changes and sidesteps without LOB while using RW, decreased cadence with slightly wide BOS  Stairs            Wheelchair Mobility    Modified Rankin (Stroke Patients Only)       Balance Overall balance assessment: Needs assistance (reports 2 falls in last 6 months) Sitting-balance support: Feet supported Sitting balance-Leahy Scale: Good Sitting balance - Comments: seated EOB   Standing balance support: Single extremity supported;During functional activity Standing balance-Leahy Scale: Poor Standing balance comment: reliant on UE support        Pertinent Vitals/Pain Pain Assessment: No/denies pain Faces Pain Scale: No hurt    Home Living Family/patient expects to be discharged to:: Private residence Living Arrangements: Children Available Help at Discharge: Family (son works at night) Type of Home: House Home Access: Level entry     Home Layout: One level Home Equipment: Shower seat;Bedside commode;Grab bars - tub/shower;Walker - 2  wheels;Cane - quad      Prior Function Level of Independence: Independent with assistive device(s)  Comments: Pt reports using quad cane for home and community ambulation, indepedent  with ADLs, family drives. Pt reports 0 falls in the last month, but does endorse 2 in the last 6 months.     Hand Dominance   Dominant Hand: Right    Extremity/Trunk Assessment   Upper Extremity Assessment Upper Extremity Assessment: Defer to OT evaluation    Lower Extremity Assessment Lower Extremity Assessment: Generalized weakness (AROM WNL, audible knee popping noted with mobility, strength 3+/5 throughout, denies numbness/tingling)    Cervical / Trunk Assessment Cervical / Trunk Assessment: Normal  Communication   Communication: HOH  Cognition Arousal/Alertness: Awake/alert Behavior During Therapy: WFL for tasks assessed/performed Overall Cognitive Status: Within Functional Limits for tasks assessed  General Comments: patient appears a little overwhelmed when asked if she can order her own lunch, states "whenever I come here I need someone..."      General Comments General comments (skin integrity, edema, etc.): HR 95 max with ambulation    Exercises     Assessment/Plan    PT Assessment Patient needs continued PT services  PT Problem List Decreased strength;Decreased range of motion;Decreased activity tolerance;Decreased balance;Decreased mobility;Decreased knowledge of use of DME       PT Treatment Interventions DME instruction;Gait training;Functional mobility training;Therapeutic activities;Therapeutic exercise;Balance training;Patient/family education    PT Goals (Current goals can be found in the Care Plan section)  Acute Rehab PT Goals Patient Stated Goal: home with son to assist PT Goal Formulation: With patient Time For Goal Achievement: 10/24/20 Potential to Achieve Goals: Good    Frequency Min 3X/week   Barriers to discharge        Co-evaluation               AM-PAC PT "6 Clicks" Mobility  Outcome Measure Help needed turning from your back to your side while in a flat bed without using bedrails?: A Little Help needed moving from lying  on your back to sitting on the side of a flat bed without using bedrails?: A Little Help needed moving to and from a bed to a chair (including a wheelchair)?: A Little Help needed standing up from a chair using your arms (e.g., wheelchair or bedside chair)?: A Little Help needed to walk in hospital room?: A Little Help needed climbing 3-5 steps with a railing? : A Lot 6 Click Score: 17    End of Session Equipment Utilized During Treatment: Gait belt Activity Tolerance: Patient tolerated treatment well Patient left: in bed;with call bell/phone within reach;with bed alarm set;Other (comment) (Education officer, museum in room) Nurse Communication: Mobility status PT Visit Diagnosis: Unsteadiness on feet (R26.81);Other abnormalities of gait and mobility (R26.89);Muscle weakness (generalized) (M62.81)    Time: 2094-7096 PT Time Calculation (min) (ACUTE ONLY): 17 min   Charges:   PT Evaluation $PT Eval Low Complexity: 1 Low           Tori Broussard PT, DPT 10/10/20, 3:19 PM

## 2020-10-11 DIAGNOSIS — R1310 Dysphagia, unspecified: Secondary | ICD-10-CM

## 2020-10-11 DIAGNOSIS — R112 Nausea with vomiting, unspecified: Secondary | ICD-10-CM

## 2020-10-11 DIAGNOSIS — E872 Acidosis: Secondary | ICD-10-CM

## 2020-10-11 DIAGNOSIS — R630 Anorexia: Secondary | ICD-10-CM

## 2020-10-11 LAB — BASIC METABOLIC PANEL
Anion gap: 9 (ref 5–15)
BUN: 19 mg/dL (ref 8–23)
CO2: 14 mmol/L — ABNORMAL LOW (ref 22–32)
Calcium: 8.8 mg/dL — ABNORMAL LOW (ref 8.9–10.3)
Chloride: 112 mmol/L — ABNORMAL HIGH (ref 98–111)
Creatinine, Ser: 0.86 mg/dL (ref 0.44–1.00)
GFR, Estimated: >60 mL/min (ref >60)
Glucose, Bld: 87 mg/dL (ref 70–99)
Potassium: 3.5 mmol/L (ref 3.5–5.1)
Sodium: 135 mmol/L (ref 135–145)

## 2020-10-11 LAB — GLUCOSE, CAPILLARY
Glucose-Capillary: 94 mg/dL (ref 70–99)
Glucose-Capillary: 88 mg/dL (ref 70–99)
Glucose-Capillary: 87 mg/dL (ref 70–99)
Glucose-Capillary: 129 mg/dL — ABNORMAL HIGH (ref 70–99)

## 2020-10-11 LAB — BLOOD GAS, ARTERIAL
Acid-base deficit: 9.7 mmol/L — ABNORMAL HIGH (ref 0.0–2.0)
Bicarbonate: 13.7 mmol/L — ABNORMAL LOW (ref 20.0–28.0)
Drawn by: 331471
O2 Saturation: 96.4 %
Patient temperature: 98.6
pCO2 arterial: 24 mmHg — ABNORMAL LOW (ref 32.0–48.0)
pH, Arterial: 7.374 (ref 7.350–7.450)
pO2, Arterial: 84.5 mmHg (ref 83.0–108.0)

## 2020-10-11 LAB — CBC
HCT: 38.6 % (ref 36.0–46.0)
Hemoglobin: 12.5 g/dL (ref 12.0–15.0)
MCH: 30 pg (ref 26.0–34.0)
MCHC: 32.4 g/dL (ref 30.0–36.0)
MCV: 92.6 fL (ref 80.0–100.0)
Platelets: 317 10*3/uL (ref 150–400)
RBC: 4.17 MIL/uL (ref 3.87–5.11)
RDW: 14.9 % (ref 11.5–15.5)
WBC: 10.5 10*3/uL (ref 4.0–10.5)
nRBC: 0 % (ref 0.0–0.2)

## 2020-10-11 LAB — NA AND K (SODIUM & POTASSIUM), RAND UR
Potassium Urine: 27 mmol/L
Sodium, Ur: 148 mmol/L

## 2020-10-11 LAB — CHLORIDE, URINE, RANDOM: Chloride Urine: 173 mmol/L

## 2020-10-11 MED ORDER — POTASSIUM CHLORIDE IN NACL 40-0.9 MEQ/L-% IV SOLN
INTRAVENOUS | Status: DC
  Filled 2020-10-11 (×2): qty 1000

## 2020-10-11 MED ORDER — POTASSIUM CHLORIDE CRYS ER 20 MEQ PO TBCR
40.0000 meq | EXTENDED_RELEASE_TABLET | Freq: Once | ORAL | Status: AC
  Administered 2020-10-11: 40 meq via ORAL
  Filled 2020-10-11: qty 2

## 2020-10-11 MED ORDER — LIP MEDEX EX OINT: TOPICAL_OINTMENT | CUTANEOUS | Status: DC | PRN

## 2020-10-11 MED ORDER — SODIUM CHLORIDE 0.9 % IV SOLN: INTRAVENOUS | Status: DC

## 2020-10-11 NOTE — Progress Notes (Addendum)
PROGRESS NOTE   Anna Hoffman  VOZ:[id number]    DOB: 20-Nov-1934    DOA: 10/09/2020  PCP: Glendale Chard, MD   I have briefly reviewed patients previous medical records in [facility].  Chief Complaint  Patient presents with  . Emesis  . Diarrhea    Brief Narrative:  86 year old female with medical history including but not limited to osteoarthritis, type II DM, HTN, hypokalemia, pneumonia, varicella-zoster, thalamic hemorrhagic stroke who presented to the ED due to nausea, vomiting and diarrhea of 3 days duration.  She was admitted for acute viral gastroenteritis with associated dehydration with hyponatremia, acute kidney injury, hyperkalemia, non-anion gap metabolic acidosis.  Nephrology consulted.  AKI and hyperkalemia resolved.  Persisting NAG metabolic acidosis.  [facility] consulted to evaluate couple of weeks history of poor oral intake,?  Dysphagia, early satiety, nausea and vomiting and inability to keep food down.  Plan for EGD 3/11.   Assessment & Plan:  Principal Problem:   Acute renal failure superimposed on stage 3a chronic kidney disease ([name]) Active Problems:   Hypertension   Gastroenteritis   Type 2 diabetes mellitus with stage 3 chronic kidney disease, without long-term current use of insulin ([name])   Mixed hyperlipidemia   Aortic atherosclerosis ([name])   Hyperkalemia   Prolonged QT interval   Hyponatremia   Acute kidney injury superimposed on chronic kidney disease ([place])   Acute kidney injury complicating stage IIIa CKD  Creatinine 1.08 in November 2021.  Presented with creatinine of 1.79, sodium 131, potassium greater than 7.5, bicarbonate 13 anion gap of 9.  BUN 42.  Likely precipitated by acute viral gastroenteritis, dehydration and ongoing ARB use.  CT abdomen and pelvis without contrast: No acute findings.  Renal ultrasound: Increased right cortical echotexture right kidney consistent with medical renal disease.  ARB held.  Treated with IV  fluids.  Creatinine has normalized.  Nephrology follow-up appreciated.  I discussed with Dr. Melvia Heaps 3/9, recommends continuing IV fluids for at least another day until eating well, avoid ACEI/ARB for 2 to 4 weeks, no renal follow-up recommended.  He also recommends oral bicarbonate 650 twice daily for 5 to 7 days.  Continue IV fluids in the context of ongoing poor oral intake and emesis.  Hyperkalemia  Secondary to AKI and ARB use  Treated with IV fluid bolus, albuterol inhalations, insulin dextrose, calcium gluconate and Lokelma  Resolved.  Continuing to hold ARB  NAG metabolic acidosis  Bicarbonate persistently low at 13 and anion gap is normal.  Likely secondary to recent GI losses.  As discussed with nephrology, continue IV fluids and added oral bicarbonate 1300 mg twice daily.  Bicarbonate only marginally up from 13-14. Follow BMP in a.m.  Patient reports that her PCP had her on oral bicarbonate supplements, wonder if this is a chronic issue for her.  Discussed with nephrology: Recommended getting ABG, urine random sodium, potassium and chloride and they will review.  Dehydration with hyponatremia  Improved.  Continue gentle IV fluids for additional 24 hours until ensure that she is taking orally well.  Leukocytosis  Suspect stress response.  Improved with  Essential hypertension:  Irbesartan held due to AKI.  Continue amlodipine 10 mg daily and carvedilol 3.125 mg twice daily  Reasonable control.  Nausea, vomiting, early satiety, possible dysphagia  Current symptoms likely not related to viral GE but some other upper GI issue that warrants further evaluation.  [facility] consulted and plan EGD in a.m.  S/p hiatal hernia repair in 2016.  [facility] consulted and plan EGD.  Type II DM with stage III CKD without long-term insulin use  A1c 6.7 suggests good outpatient control.  SSI.  Dyslipidemia  Continue Crestor.  Aortic atherosclerosis  Continue  Crestor.  Prolonged QTC  Recheck EKG 3/9 showed QTC of 589 ms.  Replace K>2 and Mg>4 and monitor on telemetry.  Minimize QT prolonging medications.  Stable left renal angiomyolipoma and left renal cyst  Noted on CT abdomen.  Outpatient follow-up as deemed necessary.  Body mass index is 22.12 kg/m.   DVT prophylaxis: enoxaparin (LOVENOX) injection 40 mg Start: 10/10/20 1000     Code Status: Full Code Family Communication: I discussed in detail with patient's son via phone on 3/10, updated care and answered all questions. Disposition:  Status is: Inpatient  The patient will require care spanning > 2 midnights and should be moved to inpatient because: IV treatments appropriate due to intensity of illness or inability to take PO  Dispo: The patient is from: Home              Anticipated d/c is to: Home              Patient currently is not medically stable to d/c.   Difficult to place patient No        Consultants:   Nephrology-signed off 3/9 [facility]  Procedures:   None  Antimicrobials:    Anti-infectives (From admission, onward)   None        Subjective:  Patient is a poor historian.  Reports 1 week history of intermittent nausea and vomiting, feeling food getting stuck in her food passage, preferential tolerance of food-avoids greasy and spicy food.  However her son indicated that this issue has been going on for more than 3 weeks, poor oral intake and associated weight loss.  He was really concerned about this.  Objective:   Vitals:   10/11/20 0623 10/11/20 1105 10/11/20 1129 10/11/20 1358  BP: (!) 143/78 (!) 116/91  138/71  Pulse: 98 (!) 114 87 95  Resp: 20 16  16   Temp: (!) 97.4 F (36.3 C) 97.7 F (36.5 C)  98.8 F (37.1 C)  TempSrc: Oral Oral  Oral  SpO2: 96% 92%  95%  Weight:      Height:        General exam: Elderly female, moderately built and thinly nourished lying comfortably propped up in bed.  Oral mucosa with borderline  hydration. Respiratory system: Clear to auscultation.  No increased work of breathing. Cardiovascular system: S1 and S2 heard, RRR.  No JVD, murmurs or pedal edema.  Telemetry personally reviewed: SR-mild ST. Gastrointestinal system: Abdomen is nondistended, soft and nontender. No organomegaly or masses felt. Normal bowel sounds heard. Central nervous system: Alert and oriented. No focal neurological deficits. Extremities: Symmetric 5 x 5 power. Skin: No rashes, lesions or ulcers Psychiatry: Judgement and insight appear normal. Mood & affect appropriate.     Data Reviewed:   I have personally reviewed following labs and imaging studies   CBC: Recent Labs  Lab 10/09/20 1514 10/10/20 0411 10/11/20 0425  WBC 13.2* 19.5* 10.5  HGB 14.8 13.1 12.5  HCT 45.9 39.9 38.6  MCV 92.2 91.9 92.6  PLT 372 331 865    Basic Metabolic Panel: Recent Labs  Lab 10/09/20 1932 10/10/20 0411 10/11/20 0425  NA 137 138 135  K 5.2* 4.1 3.5  CL 116* 116* 112*  CO2 13* 13* 14*  GLUCOSE 113* 105* 87  BUN 33* 24* 19  CREATININE 1.18* 0.99 0.86  CALCIUM 9.3 9.1 8.8*  MG  --  2.0  --     Liver Function Tests: Recent Labs  Lab 10/09/20 1514 10/09/20 1932 10/10/20 0411  AST 54* 30 20  ALT 28 16 16   ALKPHOS 60 55 52  BILITOT <0.1* 1.8* 0.8  PROT 7.6 7.2 6.6  ALBUMIN 4.2 4.0 3.7    CBG: Recent Labs  Lab 10/10/20 2025 10/11/20 0817 10/11/20 1224  GLUCAP 105* 94 88    Microbiology Studies:   Recent Results (from the past 240 hour(s))  Resp Panel by RT-PCR (Flu A&B, Covid) Nasopharyngeal Swab     Status: None   Collection Time: 10/09/20  3:32 PM   Specimen: Nasopharyngeal Swab; Nasopharyngeal(NP) swabs in vial transport medium  Result Value Ref Range Status   SARS Coronavirus 2 by RT PCR NEGATIVE NEGATIVE Final    Comment: (NOTE) SARS-CoV-2 target nucleic acids are NOT DETECTED.  The SARS-CoV-2 RNA is generally detectable in upper respiratory specimens during the acute phase of  infection. The lowest concentration of SARS-CoV-2 viral copies this assay can detect is 138 copies/mL. A negative result does not preclude SARS-Cov-2 infection and should not be used as the sole basis for treatment or other patient management decisions. A negative result may occur with  improper specimen collection/handling, submission of specimen other than nasopharyngeal swab, presence of viral mutation(s) within the areas targeted by this assay, and inadequate number of viral copies(<138 copies/mL). A negative result must be combined with clinical observations, patient history, and epidemiological information. The expected result is Negative.  Fact Sheet for Patients:  EntrepreneurPulse.com.au  Fact Sheet for Healthcare Providers:  IncredibleEmployment.be  This test is no t yet approved or cleared by the [place] FDA and  has been authorized for detection and/or diagnosis of SARS-CoV-2 by FDA under an Emergency Use Authorization (EUA). This EUA will remain  in effect (meaning this test can be used) for the duration of the COVID-19 declaration under Section 564(b)(1) of the Act, 21 U.S.C.section 360bbb-3(b)(1), unless the authorization is terminated  or revoked sooner.       Influenza A by PCR NEGATIVE NEGATIVE Final   Influenza B by PCR NEGATIVE NEGATIVE Final    Comment: (NOTE) The Xpert Xpress SARS-CoV-2/FLU/RSV plus assay is intended as an aid in the diagnosis of influenza from Nasopharyngeal swab specimens and should not be used as a sole basis for treatment. Nasal washings and aspirates are unacceptable for Xpert Xpress SARS-CoV-2/FLU/RSV testing.  Fact Sheet for Patients: EntrepreneurPulse.com.au  Fact Sheet for Healthcare Providers: IncredibleEmployment.be  This test is not yet approved or cleared by the [place] FDA and has been authorized for detection and/or diagnosis of SARS-CoV-2  by FDA under an Emergency Use Authorization (EUA). This EUA will remain in effect (meaning this test can be used) for the duration of the COVID-19 declaration under Section 564(b)(1) of the Act, 21 U.S.C. section 360bbb-3(b)(1), unless the authorization is terminated or revoked.  Performed at [facility], [place] [address] [place], [place] [address]      Radiology Studies:  CT ABDOMEN PELVIS WO CONTRAST  Result Date: 10/09/2020 CLINICAL DATA:  Left lower quadrant abdominal pain, nausea/vomiting/diarrhea for 3 days EXAM: CT ABDOMEN AND PELVIS WITHOUT CONTRAST TECHNIQUE: Multidetector CT imaging of the abdomen and pelvis was performed following the standard protocol without IV contrast. COMPARISON:  08/11/2019 FINDINGS: Lower chest: No acute pleural or parenchymal lung disease. Stable hiatal hernia. Unenhanced CT  was performed per clinician order. Lack of IV contrast limits sensitivity and specificity, especially for evaluation of abdominal/pelvic solid viscera. Hepatobiliary: No focal liver abnormality is seen. No gallstones, gallbladder wall thickening, or biliary dilatation. Pancreas: Unremarkable. No pancreatic ductal dilatation or surrounding inflammatory changes. Spleen: Normal in size without focal abnormality. Adrenals/Urinary Tract: Stable fat containing left adrenal lesion measuring 2.7 cm consistent with angiomyolipoma. Stable benign cyst lower pole left kidney. No urinary tract calculi or obstructive uropathy within either kidney. Bladder is unremarkable. The adrenals are normal. Stomach/Bowel: No bowel obstruction or ileus. Diverticulosis of the descending and sigmoid colon again identified, with no evidence of acute diverticulitis. Evaluation of the bowel is limited by the lack of intravenous and oral contrast. No obvious bowel wall thickening or inflammatory change. Vascular/Lymphatic: Aortic atherosclerosis. No enlarged abdominal or pelvic lymph nodes. Reproductive:  Status post hysterectomy. No adnexal masses. Other: No free fluid or free gas.  No abdominal wall hernia. Musculoskeletal: No acute or destructive bony lesions. Stable degenerative changes of the lumbosacral junction. Reconstructed images demonstrate no additional findings. IMPRESSION: 1. Distal colonic diverticulosis with no evidence of acute diverticulitis. 2. 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 3. Stable hiatal hernia. 4.  Aortic Atherosclerosis (ICD10-I70.0). Electronically Signed   By: Randa Ngo M.D.   On: 10/09/2020 20:14   US RENAL  Result Date: 10/09/2020 CLINICAL DATA:  Acute renal insufficiency EXAM: RENAL / URINARY TRACT ULTRASOUND COMPLETE COMPARISON:  10/09/2020, 08/11/2019 FINDINGS: Right Kidney: Renal measurements: 9.1 x 4.3 x 5.0 cm = volume: 101.8 mL. Increased renal cortical echotexture. No mass or hydronephrosis visualized. Left Kidney: Renal measurements: 9.4 x 4.5 by 5.1 cm = volume: 112.8 mL. Echogenicity within normal limits. 3.5 cm cyst lower pole left kidney. The left renal angiomyolipoma seen on recent CT not well visualized by ultrasound. No hydronephrosis. Bladder: Appears normal for degree of bladder distention. Other: None. IMPRESSION: 1. Increased renal cortical echotexture right kidney, consistent with medical renal disease. 2. Simple left renal cyst. Left renal angiomyolipoma seen on preceding CT not well visualized on this study. Electronically Signed   By: Randa Ngo M.D.   On: 10/09/2020 20:34     Scheduled Meds:   . amLODipine  10 mg Oral Daily  . vitamin C  1,000 mg Oral Daily  . aspirin EC  81 mg Oral Daily  . carvedilol  3.125 mg Oral BID WC  . enoxaparin (LOVENOX) injection  40 mg Subcutaneous Q24H  . insulin aspart  0-9 Units Subcutaneous TID WC  . loratadine  10 mg Oral Daily  . pantoprazole  40 mg Oral Daily  . rosuvastatin  20 mg Oral Daily  . sodium bicarbonate  1,300 mg Oral BID  . vitamin B-12  100 mcg Oral Daily     Continuous Infusions:   . sodium chloride 75 mL/hr at 10/11/20 1358     LOS: 1 day     Vernell Leep, MD, [place], [facility]. [facility]    To contact the attending provider between 7A-7P or the covering provider during after hours 7P-7A, please log into the web site [email or web address] and access using universal [place] password for that web site. If you do not have the password, please call the hospital operator.  10/11/2020, 2:53 PM

## 2020-10-11 NOTE — Consult Note (Addendum)
Referring Provider:  [facility]         Primary Care Physician:  Glendale Chard, MD Primary Gastroenterologist:   Oretha Caprice, MD               We were asked to see this patient for:  Nausea, vomiting               Attending physician's note   I have taken a history, examined the patient and reviewed the chart. I agree with the Advanced Practitioner's note, impression and recommendations.  86 year old female with complaints of decreased appetite, nausea and vomiting with worsening reflux symptoms.  She has occasional sensation of choking when she tries to eat, she feels food gets hung up in her mid chest. We will plan to proceed with EGD to exclude erosive esophagitis or neoplastic lesion Continue PPI and antireflux measures Further recommendation based on EGD findings  The risks and benefits as well as alternatives of endoscopic procedure(s) have been discussed and reviewed. All questions answered. The patient agrees to proceed.    The patient was provided an opportunity to ask questions and all were answered. The patient agreed with the plan and demonstrated an understanding of the instructions.  Damaris Hippo , MD [phone]   ASSESSMENT / PLAN:   # 86 yo female with two week history of postprandial nausea / vomiting. Also describes recent worsening of acid reflux . She has occasional sensation of food getting "hung up" when she eats and points to distal sternum but this is more of a chronic problem.  She is s/p hiatal hernia repair in 2016.   --Patient has enough upper GI symptoms to warrant an EGD. The risks and benefits of EGD were discussed and the patient agrees to proceed.   # Diabetes. Seems to be under fairly good control. A1c is 6.7.   # AKI on admission, resolved.    # Chronic diarrhea, stable.   # GERD, recent flare in reflux symptoms. On chronic PPI  # Prolonged QTc at 589.  # Hx of multiple colon polyps February 2021. Surveillance colonoscopy per  Dr. Ardis Hughs.   HPI:                                                                                                                             Chief Complaint: nausea / vomiting, diarrhea ( chronic) and abdominal pain   Ellene Bloodsaw is a 86 y.o. female with history of severe diverticulosis, adenomatous colon polyps, chronic diarrhea, CVA, HTN, diabetes, arthrtis, hiatal hernia repair in 2016  Patient known to [place] for her history of abdominal pain, diarrhea and colon polyps. We saw her last in the office in December 2020 for evaluation of abnormal sigmoid colon on CT scan. She underwent colonoscopy for further evaluation in February 2021, see results below.   Patient presented to ED on 10/09/20 for evaluation of nausea, vomiting, diarrhea and abdominal pain. Non-contrast CT scan was  unremarkable. She was severely hyper  hyperkalemia, had AKI on CKD, normal lipase, AST 54 but liver chemistries otherwise normal. Her WBC was 13, rose to 19.5 the next day and now normal. For hyperkalemia she got 2 liter fluid bolus, insulin and Lokelma. Her GI symptoms were felt to be secondary to gastroenteritis with dehydration.    Patient's acute diarrhea has resolved though she has chronic diarrhea and takes Imodium as needed ( which is often less than once a week). She has been having postprandial nausea and vomiting for two weeks. Her acid reflux has been less well controlled on PPI and she describes feeling like food is holding up in her distal esophagus which causes discomfort. She mentions that her nerves are bad, recently lost her son. Weight stable in Epic over last few months. Patient says she hasn't recently started any new medications to have caused the nausea and vomiting. She does have vertigo but the nausea / vomiting is only related to eating.    PREVIOUS ENDOSCOPIC EVALUATIONS / PERTINENT STUDIES   EGD January 2016 --Massive hiatal hernia causing a mechanical obstruction of the distal stomach - see  above -Small gastric ulcer likely due to NG trauma -Esophagitis  February 2021 colonoscopy for abnormal CT scan  Seven sessile polyps were found in the transverse colon and ascending colon. The polyps were 3 to 10 mm in size. These polyps were removed with a cold snare. Resection and retrieval were complete. Jar 1. - Twelve pedunculated and sessile polyps were found in the descending colon. The polyps were 4 to 18 mm in size. The three largest pedunculated polyps (15, 16 and 73mm) all were removed with snare cautery and the remaining were removed with cold snare. Jar 2. One of the smaller polyps (see images, semipedunculated) in the descending bled quite a bit more than is usual and required placement of two endoclips to stop the bleeding. - Three sessile polyps were found in the sigmoid colon. The polyps were 9 to 10 mm in size. These polyps were removed with a cold snare. Resection and retrieval were complete. Jar 3. - Severe left sided diverticulosis changes (thickenend, edematous mucosa and tortuous lumen). This is almost certainly the cause of the abnormal proximal sigmoid colon noted on CT. - The exam was otherwise without abnormality on direct and retroflexion views.   Surgical [P], colon, transverse and ascending, polyp (7) - TUBULAR ADENOMA (MULTIPLE FRAGMENTS). - NO HIGH GRADE DYSPLASIA OR MALIGNANCY. 2. Surgical [P], colon, descending, polyp (12) - TUBULAR ADENOMA (MULTIPLE FRAGMENTS). - NO HIGH GRADE DYSPLASIA OR MALIGNANCY. 3. Surgical [P], colon, sigmoid, polyp (3) - TUBULAR ADENOMA (X2 FRAGMENTS). - NO HIGH GRADE DYSPLASIA OR MALIGNANCY   10/09/20  CT ABDOMEN AND PELVIS WITHOUT CONTRAST  TECHNIQUE: Multidetector CT imaging of the abdomen and pelvis was performed following the standard protocol without IV contrast.  COMPARISON:  08/11/2019  FINDINGS: Lower chest: No acute pleural or parenchymal lung disease. Stable hiatal hernia.  Unenhanced CT was  performed per clinician order. Lack of IV contrast limits sensitivity and specificity, especially for evaluation of abdominal/pelvic solid viscera.  Hepatobiliary: No focal liver abnormality is seen. No gallstones, gallbladder wall thickening, or biliary dilatation.  Pancreas: Unremarkable. No pancreatic ductal dilatation or surrounding inflammatory changes.  Spleen: Normal in size without focal abnormality.  Adrenals/Urinary Tract: Stable fat containing left adrenal lesion measuring 2.7 cm consistent with angiomyolipoma. Stable benign cyst lower pole left kidney. No urinary tract calculi or obstructive uropathy within either kidney. Bladder is  unremarkable. The adrenals are normal.  Stomach/Bowel: No bowel obstruction or ileus. Diverticulosis of the descending and sigmoid colon again identified, with no evidence of acute diverticulitis. Evaluation of the bowel is limited by the lack of intravenous and oral contrast. No obvious bowel wall thickening or inflammatory change.  Vascular/Lymphatic: Aortic atherosclerosis. No enlarged abdominal or pelvic lymph nodes.  Reproductive: Status post hysterectomy. No adnexal masses.  Other: No free fluid or free gas.  No abdominal wall hernia.  Musculoskeletal: No acute or destructive bony lesions. Stable degenerative changes of the lumbosacral junction. Reconstructed images demonstrate no additional findings.  IMPRESSION: 1. Distal colonic diverticulosis with no evidence of acute diverticulitis. 2. 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 3. Stable hiatal hernia. 4.  Aortic Atherosclerosis (ICD10-I70.0).   Past Medical History:  Diagnosis Date  . Arthritis   . Diabetes mellitus without complication ([place])   . Hypertension   . Hypokalemia   . Pneumonia   . Shingles   . Stroke [facility])     Past Surgical History:  Procedure Laterality Date  . ABDOMINAL HYSTERECTOMY    . BREAST EXCISIONAL BIOPSY Left   .  ESOPHAGOGASTRODUODENOSCOPY N/A 09/01/2014   Procedure: ESOPHAGOGASTRODUODENOSCOPY (EGD);  Surgeon: Lear Ng, MD;  Location: [facility];  Service: Endoscopy;  Laterality: N/A;  . HIATAL HERNIA REPAIR N/A 09/04/2014   Procedure: LAPAROSCOPIC REPAIR OF HIATAL HERNIA;  Surgeon: Excell Seltzer, MD;  Location: [facility];  Service: General;  Laterality: N/A;  With MESH  . IR GENERIC HISTORICAL  04/10/2016   IR US GUIDE VASC ACCESS RIGHT 04/10/2016 Corrie Mckusick, DO [facility]  . IR GENERIC HISTORICAL  04/10/2016   IR ANGIOGRAM SELECTIVE EACH ADDITIONAL VESSEL 04/10/2016 Corrie Mckusick, DO [facility]  . IR GENERIC HISTORICAL  04/10/2016   IR ANGIOGRAM SELECTIVE EACH ADDITIONAL VESSEL 04/10/2016 Corrie Mckusick, DO [facility]  . IR GENERIC HISTORICAL  04/10/2016   IR EMBO TUMOR ORGAN ISCHEMIA INFARCT INC GUIDE ROADMAPPING 04/10/2016 Corrie Mckusick, DO [facility]  . IR GENERIC HISTORICAL  04/10/2016   IR ANGIOGRAM SELECTIVE EACH ADDITIONAL VESSEL 04/10/2016 Corrie Mckusick, DO [facility]  . IR GENERIC HISTORICAL  04/10/2016   IR ANGIOGRAM SELECTIVE EACH ADDITIONAL VESSEL 04/10/2016 Corrie Mckusick, DO [facility]  . IR GENERIC HISTORICAL  04/10/2016   IR RENAL SELECTIVE  UNI INC S&I MOD SED 04/10/2016 Corrie Mckusick, DO [facility]  . IR GENERIC HISTORICAL  03/06/2016   IR RADIOLOGIST EVAL & MGMT 03/06/2016 Corrie Mckusick, DO [facility]  . IR GENERIC HISTORICAL  03/26/2016   IR RADIOLOGIST EVAL & MGMT 03/26/2016 Corrie Mckusick, DO [facility]  . IR GENERIC HISTORICAL  04/29/2016   IR RADIOLOGIST EVAL & MGMT 04/29/2016 [facility]  . IR RADIOLOGIST EVAL & MGMT  11/12/2016  . IR RADIOLOGIST EVAL & MGMT  12/16/2017  . KNEE SURGERY    . TONSILLECTOMY      Prior to Admission medications   Medication Sig Start Date End Date Taking? Authorizing Provider  acetaminophen (TYLENOL) 325 MG tablet Take 2 tablets (650 mg total) by mouth every 6 (six) hours as needed for mild pain (or Fever >/= 101). 02/08/16  Yes  Johnson, Clanford L, MD  amLODipine (NORVASC) 10 MG tablet TAKE 1 TABLET BY MOUTH ONCE DAILY Patient taking differently: Take 10 mg by mouth daily. 08/24/20  Yes Glendale Chard, MD  Ascorbic Acid (VITAMIN C) 1000 MG tablet Take 1,000 mg by mouth daily.   Yes [provider]  aspirin EC 81 MG tablet Take 81 mg by mouth daily.   Yes [provider]  azelastine (ASTELIN) 0.1 % nasal spray Place 2 sprays into both nostrils 2 (two) times daily. Use in each nostril as directed Patient taking differently: Place 2 sprays into both nostrils daily as needed for rhinitis. 10/06/18  Yes Padgett, Rae Halsted, MD  benzonatate (TESSALON PERLES) 100 MG capsule Take 1 capsule (100 mg total) by mouth 3 (three) times daily as needed for cough. 12/10/19  Yes Glendale Chard, MD  Carboxymethylcellul-Glycerin (LUBRICATING EYE DROPS OP) Apply 1 drop to eye daily as needed (dry eyes).    Yes [provider]  Fexofenadine HCl (ALLEGRA PO) Take 180 mg by mouth at bedtime.    Yes [provider]  irbesartan (AVAPRO) 150 MG tablet TAKE 1 TABLET BY MOUTH EVERY DAY. Patient taking differently: Take 150 mg by mouth daily. 04/02/20  Yes Glendale Chard, MD  omeprazole (PRILOSEC) 20 MG capsule TAKE 1 CAPSULE BY MOUTH ONCE DAILY 30 MINUTES TO 1 HOUR BEFORE A MEAL Patient taking differently: Take 20 mg by mouth daily. 08/06/20  Yes Glendale Chard, MD  rosuvastatin (CRESTOR) 20 MG tablet TAKE 1 TABLET BY MOUTH ONCE DAILY Patient taking differently: Take 20 mg by mouth daily. 08/31/20  Yes Glendale Chard, MD  trolamine salicylate (ASPERCREME) 10 % cream Apply 1 application topically daily as needed for muscle pain.   Yes [provider]  vitamin B-12 (CYANOCOBALAMIN) 100 MCG tablet Take 100 mcg by mouth daily.   Yes [provider]  glucose blood (ONETOUCH VERIO) test strip 1 each by Other route as needed for other. Use as instructed to check blood sugar 2 times per day dx: e11.65  10/19/19   Glendale Chard, MD  Lancets [facility] ULTRASOFT) lancets Use as instructed to check blood sugar 2 times per day dx code e11.65 10/19/19   Glendale Chard, MD  meclizine (ANTIVERT) 12.5 MG tablet Take 1 tablet (12.5 mg total) by mouth 3 (three) times daily as needed for dizziness. Patient not taking: No sig reported 06/21/20   Bary Castilla, NP  olopatadine (PATANOL) 0.1 % ophthalmic solution Place 1 drop into both eyes 2 (two) times daily. Patient not taking: Reported on 10/09/2020 06/15/19   Kennith Gain, MD  PAZEO 0.7 % SOLN INSTILL 1 DROP INTO BOTH EYES DAILY. Patient not taking: Reported on 10/09/2020 12/19/19   Kennith Gain, MD  triamcinolone (KENALOG) 0.1 % APPLY TO THE AFFECTED AREA(S) 2 TIMES DAILY Patient not taking: No sig reported 08/24/20   Glendale Chard, MD    Current Facility-Administered Medications  Medication Dose Route Frequency Provider Last Rate Last Admin  . 0.9 %  sodium chloride infusion   Intravenous Continuous Modena Jansky, MD 75 mL/hr at 10/11/20 0153 New Bag at 10/11/20 0153  . acetaminophen (TYLENOL) tablet 650 mg  650 mg Oral Q6H PRN Reubin Milan, MD   650 mg at 10/11/20 0559   Or  . acetaminophen (TYLENOL) suppository 650 mg  650 mg Rectal Q6H PRN Reubin Milan, MD      . amLODipine [facility]) tablet 10 mg  10 mg Oral Daily Reubin Milan, MD   10 mg at 10/11/20 0955  . ascorbic acid (VITAMIN C) tablet 1,000 mg  1,000 mg Oral Daily Reubin Milan, MD   1,000 mg at 10/11/20 1696  . aspirin EC tablet 81 mg  81 mg Oral Daily Reubin Milan, MD   81 mg at  10/11/20 0955  . azelastine (ASTELIN) 0.1 % nasal spray 1 spray  1 spray Each Nare Daily PRN Reubin Milan, MD      . benzonatate (TESSALON) capsule 100 mg  100 mg Oral TID PRN Reubin Milan, MD      . carvedilol (COREG) tablet 3.125 mg  3.125 mg Oral BID WC Reubin Milan, MD   3.125 mg at 10/11/20 0955  . enoxaparin (LOVENOX)  injection 40 mg  40 mg Subcutaneous Q24H Reubin Milan, MD   40 mg at 10/11/20 0955  . insulin aspart (novoLOG) injection 0-9 Units  0-9 Units Subcutaneous TID WC Reubin Milan, MD      . loratadine [facility]) tablet 10 mg  10 mg Oral Daily Reubin Milan, MD   10 mg at 10/11/20 0955  . pantoprazole (PROTONIX) EC tablet 40 mg  40 mg Oral Daily Reubin Milan, MD   40 mg at 10/11/20 0956  . polyvinyl alcohol (LIQUIFILM TEARS) 1.4 % ophthalmic solution 1 drop  1 drop Both Eyes TID PRN Reubin Milan, MD      . prochlorperazine (COMPAZINE) injection 5 mg  5 mg Intravenous Q4H PRN Reubin Milan, MD   5 mg at 10/10/20 1038  . rosuvastatin (CRESTOR) tablet 20 mg  20 mg Oral Daily Reubin Milan, MD   20 mg at 10/11/20 0956  . sodium bicarbonate tablet 1,300 mg  1,300 mg Oral BID Modena Jansky, MD   1,300 mg at 10/11/20 0954  . vitamin B-12 (CYANOCOBALAMIN) tablet 100 mcg  100 mcg Oral Daily Reubin Milan, MD   100 mcg at 10/11/20 4782    Allergies as of 10/09/2020 - Review Complete 10/09/2020  Allergen Reaction Noted  . Amoxicillin Diarrhea 12/06/2011  . Ampicillin Rash 05/29/2016  . Sulfa antibiotics Rash 12/06/2011    Family History  Problem Relation Age of Onset  . Alzheimer's disease Mother   . Prostate cancer Father   . Brain cancer Brother   . Brain cancer Brother   . Pancreatic cancer Sister   . Allergic rhinitis Neg Hx   . Asthma Neg Hx   . Eczema Neg Hx   . Urticaria Neg Hx     Social History   Socioeconomic History  . Marital status: Widowed    Spouse name: Not on file  . Number of children: 2  . Years of education: Not on file  . Highest education level: Not on file  Occupational History  . Occupation: retired  Tobacco Use  . Smoking status: Never Smoker  . Smokeless tobacco: Never Used  Vaping Use  . Vaping Use: Never used  Substance and Sexual Activity  . Alcohol use: No  . Drug use: No  . Sexual activity: Not  Currently    Birth control/protection: Surgical  Other Topics Concern  . Not on file  Social History Narrative   Son [name] lives with her   Social Determinants of Health   Financial Resource Strain: Low Risk   . Difficulty of Paying Living Expenses: Not hard at all  Food Insecurity: No Food Insecurity  . Worried About Charity fundraiser in the Last Year: Never true  . Ran Out of Food in the Last Year: Never true  Transportation Needs: No Transportation Needs  . Lack of Transportation (Medical): No  . Lack of Transportation (Non-Medical): No  Physical Activity: Insufficiently Active  . Days of Exercise per Week: 7 days  . Minutes  of Exercise per Session: 10 min  Stress: No Stress Concern Present  . Feeling of Stress : Not at all  Social Connections: Not on file  Intimate Partner Violence: Not on file    Review of Systems: Positive for 'bad nerves". All other ystems reviewed and negative except where noted in HPI.   OBJECTIVE:    Physical Exam: Vital signs in last 24 hours: Temp:  [97.4 F (36.3 C)-98.3 F (36.8 C)] 97.4 F (36.3 C) (03/10 0623) Pulse Rate:  [94-100] 98 (03/10 0623) Resp:  [18-20] 20 (03/10 0623) BP: (101-169)/(66-101) 143/78 (03/10 0623) SpO2:  [91 %-96 %] 96 % (03/10 0623) Weight:  [64 kg] 64 kg (03/10 0532) Last BM Date: 10/09/20 General:  Pleasant alert female in NAD Psych: Cooperative. Normal mood and affect. Eyes:  Pupils equal, sclera clear, no icterus.   Conjunctiva pink. Ears:  Normal auditory acuity. Nose:  No deformity, discharge,  or lesions. Neck:  Supple; no masses Lungs:  Clear throughout to auscultation.   No wheezes, crackles, or rhonchi.  Heart:  Regular rate and rhythm; no murmurs, no lower extremity edema Abdomen:  Soft, non-distended, nontender, BS active, no palp mass   Rectal:  Deferred  Msk:  Symmetrical without gross deformities. . Neurologic:  Alert and  oriented x4;  grossly normal neurologically. Skin:  Intact  without significant lesions or rashes.  Filed Weights   10/09/20 1509 10/10/20 0152 10/11/20 0532  Weight: 62.6 kg 63.7 kg 64 kg     Scheduled inpatient medications . amLODipine  10 mg Oral Daily  . vitamin C  1,000 mg Oral Daily  . aspirin EC  81 mg Oral Daily  . carvedilol  3.125 mg Oral BID WC  . enoxaparin (LOVENOX) injection  40 mg Subcutaneous Q24H  . insulin aspart  0-9 Units Subcutaneous TID WC  . loratadine  10 mg Oral Daily  . pantoprazole  40 mg Oral Daily  . rosuvastatin  20 mg Oral Daily  . sodium bicarbonate  1,300 mg Oral BID  . vitamin B-12  100 mcg Oral Daily      Intake/Output from previous day: 03/09 0701 - 03/10 0700 In: 1800.1 [P.O.:800; I.V.:1000.1] Out: 400 [Urine:400] Intake/Output this shift: No intake/output data recorded.   Lab Results: Recent Labs    10/09/20 1514 10/10/20 0411 10/11/20 0425  WBC 13.2* 19.5* 10.5  HGB 14.8 13.1 12.5  HCT 45.9 39.9 38.6  PLT 372 331 317   BMET Recent Labs    10/09/20 1932 10/10/20 0411 10/11/20 0425  NA 137 138 135  K 5.2* 4.1 3.5  CL 116* 116* 112*  CO2 13* 13* 14*  GLUCOSE 113* 105* 87  BUN 33* 24* 19  CREATININE 1.18* 0.99 0.86  CALCIUM 9.3 9.1 8.8*   LFT Recent Labs    10/10/20 0411  PROT 6.6  ALBUMIN 3.7  AST 20  ALT 16  ALKPHOS 52  BILITOT 0.8   PT/INR No results for input(s): LABPROT, INR in the last 72 hours. Hepatitis Panel No results for input(s): HEPBSAG, HCVAB, HEPAIGM, HEPBIGM in the last 72 hours.   . CBC Latest Ref Rng & Units 10/11/2020 10/10/2020 10/09/2020  WBC 4.0 - 10.5 K/uL 10.5 19.5(H) 13.2(H)  Hemoglobin 12.0 - 15.0 g/dL 12.5 13.1 14.8  Hematocrit 36.0 - 46.0 % 38.6 39.9 45.9  Platelets 150 - 400 K/uL 317 331 372    . CMP Latest Ref Rng & Units 10/11/2020 10/10/2020 10/09/2020  Glucose 70 - 99 mg/dL 87 105(H)  113(H)  BUN 8 - 23 mg/dL 19 24(H) 33(H)  Creatinine 0.44 - 1.00 mg/dL 0.86 0.99 1.18(H)  Sodium 135 - 145 mmol/L 135 138 137  Potassium 3.5 - 5.1  mmol/L 3.5 4.1 5.2(H)  Chloride 98 - 111 mmol/L 112(H) 116(H) 116(H)  CO2 22 - 32 mmol/L 14(L) 13(L) 13(L)  Calcium 8.9 - 10.3 mg/dL 8.8(L) 9.1 9.3  Total Protein 6.5 - 8.1 g/dL - 6.6 7.2  Total Bilirubin 0.3 - 1.2 mg/dL - 0.8 1.8(H)  Alkaline Phos 38 - 126 U/L - 52 55  AST 15 - 41 U/L - 20 30  ALT 0 - 44 U/L - 16 16   Studies/Results: CT ABDOMEN PELVIS WO CONTRAST  Result Date: 10/09/2020 CLINICAL DATA:  Left lower quadrant abdominal pain, nausea/vomiting/diarrhea for 3 days EXAM: CT ABDOMEN AND PELVIS WITHOUT CONTRAST TECHNIQUE: Multidetector CT imaging of the abdomen and pelvis was performed following the standard protocol without IV contrast. COMPARISON:  08/11/2019 FINDINGS: Lower chest: No acute pleural or parenchymal lung disease. Stable hiatal hernia. Unenhanced CT was performed per clinician order. Lack of IV contrast limits sensitivity and specificity, especially for evaluation of abdominal/pelvic solid viscera. Hepatobiliary: No focal liver abnormality is seen. No gallstones, gallbladder wall thickening, or biliary dilatation. Pancreas: Unremarkable. No pancreatic ductal dilatation or surrounding inflammatory changes. Spleen: Normal in size without focal abnormality. Adrenals/Urinary Tract: Stable fat containing left adrenal lesion measuring 2.7 cm consistent with angiomyolipoma. Stable benign cyst lower pole left kidney. No urinary tract calculi or obstructive uropathy within either kidney. Bladder is unremarkable. The adrenals are normal. Stomach/Bowel: No bowel obstruction or ileus. Diverticulosis of the descending and sigmoid colon again identified, with no evidence of acute diverticulitis. Evaluation of the bowel is limited by the lack of intravenous and oral contrast. No obvious bowel wall thickening or inflammatory change. Vascular/Lymphatic: Aortic atherosclerosis. No enlarged abdominal or pelvic lymph nodes. Reproductive: Status post hysterectomy. No adnexal masses. Other: No free  fluid or free gas.  No abdominal wall hernia. Musculoskeletal: No acute or destructive bony lesions. Stable degenerative changes of the lumbosacral junction. Reconstructed images demonstrate no additional findings. IMPRESSION: 1. Distal colonic diverticulosis with no evidence of acute diverticulitis. 2. 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 3. Stable hiatal hernia. 4.  Aortic Atherosclerosis (ICD10-I70.0). Electronically Signed   By: Randa Ngo M.D.   On: 10/09/2020 20:14   US RENAL  Result Date: 10/09/2020 CLINICAL DATA:  Acute renal insufficiency EXAM: RENAL / URINARY TRACT ULTRASOUND COMPLETE COMPARISON:  10/09/2020, 08/11/2019 FINDINGS: Right Kidney: Renal measurements: 9.1 x 4.3 x 5.0 cm = volume: 101.8 mL. Increased renal cortical echotexture. No mass or hydronephrosis visualized. Left Kidney: Renal measurements: 9.4 x 4.5 by 5.1 cm = volume: 112.8 mL. Echogenicity within normal limits. 3.5 cm cyst lower pole left kidney. The left renal angiomyolipoma seen on recent CT not well visualized by ultrasound. No hydronephrosis. Bladder: Appears normal for degree of bladder distention. Other: None. IMPRESSION: 1. Increased renal cortical echotexture right kidney, consistent with medical renal disease. 2. Simple left renal cyst. Left renal angiomyolipoma seen on preceding CT not well visualized on this study. Electronically Signed   By: Randa Ngo M.D.   On: 10/09/2020 20:34    Principal Problem:   Acute renal failure superimposed on stage 3a chronic kidney disease ([name]) Active Problems:   Hypertension   Gastroenteritis   Type 2 diabetes mellitus with stage 3 chronic kidney disease, without long-term current use of insulin ([name])   Mixed hyperlipidemia  Aortic atherosclerosis ([name])   Hyperkalemia   Prolonged QT interval   Hyponatremia   Acute kidney injury superimposed on chronic kidney disease ([place])    Tye Savoy, NP-C @  10/11/2020, 10:09 AM

## 2020-10-11 NOTE — H&P (View-Only) (Signed)
Referring Provider:  [facility]         Primary Care Physician:  Glendale Chard, MD Primary Gastroenterologist:   Oretha Caprice, MD               We were asked to see this patient for:  Nausea, vomiting               Attending physician's note   I have taken a history, examined the patient and reviewed the chart. I agree with the Advanced Practitioner's note, impression and recommendations.  86 year old female with complaints of decreased appetite, nausea and vomiting with worsening reflux symptoms.  She has occasional sensation of choking when she tries to eat, she feels food gets hung up in her mid chest. We will plan to proceed with EGD to exclude erosive esophagitis or neoplastic lesion Continue PPI and antireflux measures Further recommendation based on EGD findings  The risks and benefits as well as alternatives of endoscopic procedure(s) have been discussed and reviewed. All questions answered. The patient agrees to proceed.    The patient was provided an opportunity to ask questions and all were answered. The patient agreed with the plan and demonstrated an understanding of the instructions.  Anna Hoffman , MD [phone]   ASSESSMENT / PLAN:   # 86 yo female with two week history of postprandial nausea / vomiting. Also describes recent worsening of acid reflux . She has occasional sensation of food getting "hung up" when she eats and points to distal sternum but this is more of a chronic problem.  She is s/p hiatal hernia repair in 2016.   --Patient has enough upper GI symptoms to warrant an EGD. The risks and benefits of EGD were discussed and the patient agrees to proceed.   # Diabetes. Seems to be under fairly good control. A1c is 6.7.   # AKI on admission, resolved.    # Chronic diarrhea, stable.   # GERD, recent flare in reflux symptoms. On chronic PPI  # Prolonged QTc at 589.  # Hx of multiple colon polyps February 2021. Surveillance colonoscopy per  Dr. Ardis Hughs.   HPI:                                                                                                                             Chief Complaint: nausea / vomiting, diarrhea ( chronic) and abdominal pain   Anna Hoffman is a 86 y.o. female with history of severe diverticulosis, adenomatous colon polyps, chronic diarrhea, CVA, HTN, diabetes, arthrtis, hiatal hernia repair in 2016  Patient known to [place] for her history of abdominal pain, diarrhea and colon polyps. We saw her last in the office in December 2020 for evaluation of abnormal sigmoid colon on CT scan. She underwent colonoscopy for further evaluation in February 2021, see results below.   Patient presented to ED on 10/09/20 for evaluation of nausea, vomiting, diarrhea and abdominal pain. Non-contrast CT scan was  unremarkable. She was severely hyper  hyperkalemia, had AKI on CKD, normal lipase, AST 54 but liver chemistries otherwise normal. Her WBC was 13, rose to 19.5 the next day and now normal. For hyperkalemia she got 2 liter fluid bolus, insulin and Lokelma. Her GI symptoms were felt to be secondary to gastroenteritis with dehydration.    Patient's acute diarrhea has resolved though she has chronic diarrhea and takes Imodium as needed ( which is often less than once a week). She has been having postprandial nausea and vomiting for two weeks. Her acid reflux has been less well controlled on PPI and she describes feeling like food is holding up in her distal esophagus which causes discomfort. She mentions that her nerves are bad, recently lost her son. Weight stable in Epic over last few months. Patient says she hasn't recently started any new medications to have caused the nausea and vomiting. She does have vertigo but the nausea / vomiting is only related to eating.    PREVIOUS ENDOSCOPIC EVALUATIONS / PERTINENT STUDIES   EGD January 2016 --Massive hiatal hernia causing a mechanical obstruction of the distal stomach - see  above -Small gastric ulcer likely due to NG trauma -Esophagitis  February 2021 colonoscopy for abnormal CT scan  Seven sessile polyps were found in the transverse colon and ascending colon. The polyps were 3 to 10 mm in size. These polyps were removed with a cold snare. Resection and retrieval were complete. Jar 1. - Twelve pedunculated and sessile polyps were found in the descending colon. The polyps were 4 to 18 mm in size. The three largest pedunculated polyps (15, 16 and 38mm) all were removed with snare cautery and the remaining were removed with cold snare. Jar 2. One of the smaller polyps (see images, semipedunculated) in the descending bled quite a bit more than is usual and required placement of two endoclips to stop the bleeding. - Three sessile polyps were found in the sigmoid colon. The polyps were 9 to 10 mm in size. These polyps were removed with a cold snare. Resection and retrieval were complete. Jar 3. - Severe left sided diverticulosis changes (thickenend, edematous mucosa and tortuous lumen). This is almost certainly the cause of the abnormal proximal sigmoid colon noted on CT. - The exam was otherwise without abnormality on direct and retroflexion views.   Surgical [P], colon, transverse and ascending, polyp (7) - TUBULAR ADENOMA (MULTIPLE FRAGMENTS). - NO HIGH GRADE DYSPLASIA OR MALIGNANCY. 2. Surgical [P], colon, descending, polyp (12) - TUBULAR ADENOMA (MULTIPLE FRAGMENTS). - NO HIGH GRADE DYSPLASIA OR MALIGNANCY. 3. Surgical [P], colon, sigmoid, polyp (3) - TUBULAR ADENOMA (X2 FRAGMENTS). - NO HIGH GRADE DYSPLASIA OR MALIGNANCY   10/09/20  CT ABDOMEN AND PELVIS WITHOUT CONTRAST  TECHNIQUE: Multidetector CT imaging of the abdomen and pelvis was performed following the standard protocol without IV contrast.  COMPARISON:  08/11/2019  FINDINGS: Lower chest: No acute pleural or parenchymal lung disease. Stable hiatal hernia.  Unenhanced CT was  performed per clinician order. Lack of IV contrast limits sensitivity and specificity, especially for evaluation of abdominal/pelvic solid viscera.  Hepatobiliary: No focal liver abnormality is seen. No gallstones, gallbladder wall thickening, or biliary dilatation.  Pancreas: Unremarkable. No pancreatic ductal dilatation or surrounding inflammatory changes.  Spleen: Normal in size without focal abnormality.  Adrenals/Urinary Tract: Stable fat containing left adrenal lesion measuring 2.7 cm consistent with angiomyolipoma. Stable benign cyst lower pole left kidney. No urinary tract calculi or obstructive uropathy within either kidney. Bladder is  unremarkable. The adrenals are normal.  Stomach/Bowel: No bowel obstruction or ileus. Diverticulosis of the descending and sigmoid colon again identified, with no evidence of acute diverticulitis. Evaluation of the bowel is limited by the lack of intravenous and oral contrast. No obvious bowel wall thickening or inflammatory change.  Vascular/Lymphatic: Aortic atherosclerosis. No enlarged abdominal or pelvic lymph nodes.  Reproductive: Status post hysterectomy. No adnexal masses.  Other: No free fluid or free gas.  No abdominal wall hernia.  Musculoskeletal: No acute or destructive bony lesions. Stable degenerative changes of the lumbosacral junction. Reconstructed images demonstrate no additional findings.  IMPRESSION: 1. Distal colonic diverticulosis with no evidence of acute diverticulitis. 2. 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 3. Stable hiatal hernia. 4.  Aortic Atherosclerosis (ICD10-I70.0).   Past Medical History:  Diagnosis Date  . Arthritis   . Diabetes mellitus without complication ([place])   . Hypertension   . Hypokalemia   . Pneumonia   . Shingles   . Stroke [facility])     Past Surgical History:  Procedure Laterality Date  . ABDOMINAL HYSTERECTOMY    . BREAST EXCISIONAL BIOPSY Left   .  ESOPHAGOGASTRODUODENOSCOPY N/A 09/01/2014   Procedure: ESOPHAGOGASTRODUODENOSCOPY (EGD);  Surgeon: Lear Ng, MD;  Location: [facility];  Service: Endoscopy;  Laterality: N/A;  . HIATAL HERNIA REPAIR N/A 09/04/2014   Procedure: LAPAROSCOPIC REPAIR OF HIATAL HERNIA;  Surgeon: Excell Seltzer, MD;  Location: [facility];  Service: General;  Laterality: N/A;  With MESH  . IR GENERIC HISTORICAL  04/10/2016   IR US GUIDE VASC ACCESS RIGHT 04/10/2016 Corrie Mckusick, DO [facility]  . IR GENERIC HISTORICAL  04/10/2016   IR ANGIOGRAM SELECTIVE EACH ADDITIONAL VESSEL 04/10/2016 Corrie Mckusick, DO [facility]  . IR GENERIC HISTORICAL  04/10/2016   IR ANGIOGRAM SELECTIVE EACH ADDITIONAL VESSEL 04/10/2016 Corrie Mckusick, DO [facility]  . IR GENERIC HISTORICAL  04/10/2016   IR EMBO TUMOR ORGAN ISCHEMIA INFARCT INC GUIDE ROADMAPPING 04/10/2016 Corrie Mckusick, DO [facility]  . IR GENERIC HISTORICAL  04/10/2016   IR ANGIOGRAM SELECTIVE EACH ADDITIONAL VESSEL 04/10/2016 Corrie Mckusick, DO [facility]  . IR GENERIC HISTORICAL  04/10/2016   IR ANGIOGRAM SELECTIVE EACH ADDITIONAL VESSEL 04/10/2016 Corrie Mckusick, DO [facility]  . IR GENERIC HISTORICAL  04/10/2016   IR RENAL SELECTIVE  UNI INC S&I MOD SED 04/10/2016 Corrie Mckusick, DO [facility]  . IR GENERIC HISTORICAL  03/06/2016   IR RADIOLOGIST EVAL & MGMT 03/06/2016 Corrie Mckusick, DO [facility]  . IR GENERIC HISTORICAL  03/26/2016   IR RADIOLOGIST EVAL & MGMT 03/26/2016 Corrie Mckusick, DO [facility]  . IR GENERIC HISTORICAL  04/29/2016   IR RADIOLOGIST EVAL & MGMT 04/29/2016 [facility]  . IR RADIOLOGIST EVAL & MGMT  11/12/2016  . IR RADIOLOGIST EVAL & MGMT  12/16/2017  . KNEE SURGERY    . TONSILLECTOMY      Prior to Admission medications   Medication Sig Start Date End Date Taking? Authorizing Provider  acetaminophen (TYLENOL) 325 MG tablet Take 2 tablets (650 mg total) by mouth every 6 (six) hours as needed for mild pain (or Fever >/= 101). 02/08/16  Yes  Johnson, Clanford L, MD  amLODipine (NORVASC) 10 MG tablet TAKE 1 TABLET BY MOUTH ONCE DAILY Patient taking differently: Take 10 mg by mouth daily. 08/24/20  Yes Glendale Chard, MD  Ascorbic Acid (VITAMIN C) 1000 MG tablet Take 1,000 mg by mouth daily.   Yes [provider]  aspirin EC 81 MG tablet Take 81 mg by mouth daily.   Yes [provider]  azelastine (ASTELIN) 0.1 % nasal spray Place 2 sprays into both nostrils 2 (two) times daily. Use in each nostril as directed Patient taking differently: Place 2 sprays into both nostrils daily as needed for rhinitis. 10/06/18  Yes Padgett, Rae Halsted, MD  benzonatate (TESSALON PERLES) 100 MG capsule Take 1 capsule (100 mg total) by mouth 3 (three) times daily as needed for cough. 12/10/19  Yes Glendale Chard, MD  Carboxymethylcellul-Glycerin (LUBRICATING EYE DROPS OP) Apply 1 drop to eye daily as needed (dry eyes).    Yes [provider]  Fexofenadine HCl (ALLEGRA PO) Take 180 mg by mouth at bedtime.    Yes [provider]  irbesartan (AVAPRO) 150 MG tablet TAKE 1 TABLET BY MOUTH EVERY DAY. Patient taking differently: Take 150 mg by mouth daily. 04/02/20  Yes Glendale Chard, MD  omeprazole (PRILOSEC) 20 MG capsule TAKE 1 CAPSULE BY MOUTH ONCE DAILY 30 MINUTES TO 1 HOUR BEFORE A MEAL Patient taking differently: Take 20 mg by mouth daily. 08/06/20  Yes Glendale Chard, MD  rosuvastatin (CRESTOR) 20 MG tablet TAKE 1 TABLET BY MOUTH ONCE DAILY Patient taking differently: Take 20 mg by mouth daily. 08/31/20  Yes Glendale Chard, MD  trolamine salicylate (ASPERCREME) 10 % cream Apply 1 application topically daily as needed for muscle pain.   Yes [provider]  vitamin B-12 (CYANOCOBALAMIN) 100 MCG tablet Take 100 mcg by mouth daily.   Yes [provider]  glucose blood (ONETOUCH VERIO) test strip 1 each by Other route as needed for other. Use as instructed to check blood sugar 2 times per day dx: e11.65  10/19/19   Glendale Chard, MD  Lancets [facility] ULTRASOFT) lancets Use as instructed to check blood sugar 2 times per day dx code e11.65 10/19/19   Glendale Chard, MD  meclizine (ANTIVERT) 12.5 MG tablet Take 1 tablet (12.5 mg total) by mouth 3 (three) times daily as needed for dizziness. Patient not taking: No sig reported 06/21/20   Bary Castilla, NP  olopatadine (PATANOL) 0.1 % ophthalmic solution Place 1 drop into both eyes 2 (two) times daily. Patient not taking: Reported on 10/09/2020 06/15/19   Kennith Gain, MD  PAZEO 0.7 % SOLN INSTILL 1 DROP INTO BOTH EYES DAILY. Patient not taking: Reported on 10/09/2020 12/19/19   Kennith Gain, MD  triamcinolone (KENALOG) 0.1 % APPLY TO THE AFFECTED AREA(S) 2 TIMES DAILY Patient not taking: No sig reported 08/24/20   Glendale Chard, MD    Current Facility-Administered Medications  Medication Dose Route Frequency Provider Last Rate Last Admin  . 0.9 %  sodium chloride infusion   Intravenous Continuous Modena Jansky, MD 75 mL/hr at 10/11/20 0153 New Bag at 10/11/20 0153  . acetaminophen (TYLENOL) tablet 650 mg  650 mg Oral Q6H PRN Reubin Milan, MD   650 mg at 10/11/20 0559   Or  . acetaminophen (TYLENOL) suppository 650 mg  650 mg Rectal Q6H PRN Reubin Milan, MD      . amLODipine [facility]) tablet 10 mg  10 mg Oral Daily Reubin Milan, MD   10 mg at 10/11/20 0955  . ascorbic acid (VITAMIN C) tablet 1,000 mg  1,000 mg Oral Daily Reubin Milan, MD   1,000 mg at 10/11/20 2951  . aspirin EC tablet 81 mg  81 mg Oral Daily Reubin Milan, MD   81 mg at  10/11/20 0955  . azelastine (ASTELIN) 0.1 % nasal spray 1 spray  1 spray Each Nare Daily PRN Reubin Milan, MD      . benzonatate (TESSALON) capsule 100 mg  100 mg Oral TID PRN Reubin Milan, MD      . carvedilol (COREG) tablet 3.125 mg  3.125 mg Oral BID WC Reubin Milan, MD   3.125 mg at 10/11/20 0955  . enoxaparin (LOVENOX)  injection 40 mg  40 mg Subcutaneous Q24H Reubin Milan, MD   40 mg at 10/11/20 0955  . insulin aspart (novoLOG) injection 0-9 Units  0-9 Units Subcutaneous TID WC Reubin Milan, MD      . loratadine [facility]) tablet 10 mg  10 mg Oral Daily Reubin Milan, MD   10 mg at 10/11/20 0955  . pantoprazole (PROTONIX) EC tablet 40 mg  40 mg Oral Daily Reubin Milan, MD   40 mg at 10/11/20 0956  . polyvinyl alcohol (LIQUIFILM TEARS) 1.4 % ophthalmic solution 1 drop  1 drop Both Eyes TID PRN Reubin Milan, MD      . prochlorperazine (COMPAZINE) injection 5 mg  5 mg Intravenous Q4H PRN Reubin Milan, MD   5 mg at 10/10/20 1038  . rosuvastatin (CRESTOR) tablet 20 mg  20 mg Oral Daily Reubin Milan, MD   20 mg at 10/11/20 0956  . sodium bicarbonate tablet 1,300 mg  1,300 mg Oral BID Modena Jansky, MD   1,300 mg at 10/11/20 0954  . vitamin B-12 (CYANOCOBALAMIN) tablet 100 mcg  100 mcg Oral Daily Reubin Milan, MD   100 mcg at 10/11/20 8144    Allergies as of 10/09/2020 - Review Complete 10/09/2020  Allergen Reaction Noted  . Amoxicillin Diarrhea 12/06/2011  . Ampicillin Rash 05/29/2016  . Sulfa antibiotics Rash 12/06/2011    Family History  Problem Relation Age of Onset  . Alzheimer's disease Mother   . Prostate cancer Father   . Brain cancer Brother   . Brain cancer Brother   . Pancreatic cancer Sister   . Allergic rhinitis Neg Hx   . Asthma Neg Hx   . Eczema Neg Hx   . Urticaria Neg Hx     Social History   Socioeconomic History  . Marital status: Widowed    Spouse name: Not on file  . Number of children: 2  . Years of education: Not on file  . Highest education level: Not on file  Occupational History  . Occupation: retired  Tobacco Use  . Smoking status: Never Smoker  . Smokeless tobacco: Never Used  Vaping Use  . Vaping Use: Never used  Substance and Sexual Activity  . Alcohol use: No  . Drug use: No  . Sexual activity: Not  Currently    Birth control/protection: Surgical  Other Topics Concern  . Not on file  Social History Narrative   Son [name] lives with her   Social Determinants of Health   Financial Resource Strain: Low Risk   . Difficulty of Paying Living Expenses: Not hard at all  Food Insecurity: No Food Insecurity  . Worried About Charity fundraiser in the Last Year: Never true  . Ran Out of Food in the Last Year: Never true  Transportation Needs: No Transportation Needs  . Lack of Transportation (Medical): No  . Lack of Transportation (Non-Medical): No  Physical Activity: Insufficiently Active  . Days of Exercise per Week: 7 days  . Minutes  of Exercise per Session: 10 min  Stress: No Stress Concern Present  . Feeling of Stress : Not at all  Social Connections: Not on file  Intimate Partner Violence: Not on file    Review of Systems: Positive for 'bad nerves". All other ystems reviewed and negative except where noted in HPI.   OBJECTIVE:    Physical Exam: Vital signs in last 24 hours: Temp:  [97.4 F (36.3 C)-98.3 F (36.8 C)] 97.4 F (36.3 C) (03/10 0623) Pulse Rate:  [94-100] 98 (03/10 0623) Resp:  [18-20] 20 (03/10 0623) BP: (101-169)/(66-101) 143/78 (03/10 0623) SpO2:  [91 %-96 %] 96 % (03/10 0623) Weight:  [64 kg] 64 kg (03/10 0532) Last BM Date: 10/09/20 General:  Pleasant alert female in NAD Psych: Cooperative. Normal mood and affect. Eyes:  Pupils equal, sclera clear, no icterus.   Conjunctiva pink. Ears:  Normal auditory acuity. Nose:  No deformity, discharge,  or lesions. Neck:  Supple; no masses Lungs:  Clear throughout to auscultation.   No wheezes, crackles, or rhonchi.  Heart:  Regular rate and rhythm; no murmurs, no lower extremity edema Abdomen:  Soft, non-distended, nontender, BS active, no palp mass   Rectal:  Deferred  Msk:  Symmetrical without gross deformities. . Neurologic:  Alert and  oriented x4;  grossly normal neurologically. Skin:  Intact  without significant lesions or rashes.  Filed Weights   10/09/20 1509 10/10/20 0152 10/11/20 0532  Weight: 62.6 kg 63.7 kg 64 kg     Scheduled inpatient medications . amLODipine  10 mg Oral Daily  . vitamin C  1,000 mg Oral Daily  . aspirin EC  81 mg Oral Daily  . carvedilol  3.125 mg Oral BID WC  . enoxaparin (LOVENOX) injection  40 mg Subcutaneous Q24H  . insulin aspart  0-9 Units Subcutaneous TID WC  . loratadine  10 mg Oral Daily  . pantoprazole  40 mg Oral Daily  . rosuvastatin  20 mg Oral Daily  . sodium bicarbonate  1,300 mg Oral BID  . vitamin B-12  100 mcg Oral Daily      Intake/Output from previous day: 03/09 0701 - 03/10 0700 In: 1800.1 [P.O.:800; I.V.:1000.1] Out: 400 [Urine:400] Intake/Output this shift: No intake/output data recorded.   Lab Results: Recent Labs    10/09/20 1514 10/10/20 0411 10/11/20 0425  WBC 13.2* 19.5* 10.5  HGB 14.8 13.1 12.5  HCT 45.9 39.9 38.6  PLT 372 331 317   BMET Recent Labs    10/09/20 1932 10/10/20 0411 10/11/20 0425  NA 137 138 135  K 5.2* 4.1 3.5  CL 116* 116* 112*  CO2 13* 13* 14*  GLUCOSE 113* 105* 87  BUN 33* 24* 19  CREATININE 1.18* 0.99 0.86  CALCIUM 9.3 9.1 8.8*   LFT Recent Labs    10/10/20 0411  PROT 6.6  ALBUMIN 3.7  AST 20  ALT 16  ALKPHOS 52  BILITOT 0.8   PT/INR No results for input(s): LABPROT, INR in the last 72 hours. Hepatitis Panel No results for input(s): HEPBSAG, HCVAB, HEPAIGM, HEPBIGM in the last 72 hours.   . CBC Latest Ref Rng & Units 10/11/2020 10/10/2020 10/09/2020  WBC 4.0 - 10.5 K/uL 10.5 19.5(H) 13.2(H)  Hemoglobin 12.0 - 15.0 g/dL 12.5 13.1 14.8  Hematocrit 36.0 - 46.0 % 38.6 39.9 45.9  Platelets 150 - 400 K/uL 317 331 372    . CMP Latest Ref Rng & Units 10/11/2020 10/10/2020 10/09/2020  Glucose 70 - 99 mg/dL 87 105(H)  113(H)  BUN 8 - 23 mg/dL 19 24(H) 33(H)  Creatinine 0.44 - 1.00 mg/dL 0.86 0.99 1.18(H)  Sodium 135 - 145 mmol/L 135 138 137  Potassium 3.5 - 5.1  mmol/L 3.5 4.1 5.2(H)  Chloride 98 - 111 mmol/L 112(H) 116(H) 116(H)  CO2 22 - 32 mmol/L 14(L) 13(L) 13(L)  Calcium 8.9 - 10.3 mg/dL 8.8(L) 9.1 9.3  Total Protein 6.5 - 8.1 g/dL - 6.6 7.2  Total Bilirubin 0.3 - 1.2 mg/dL - 0.8 1.8(H)  Alkaline Phos 38 - 126 U/L - 52 55  AST 15 - 41 U/L - 20 30  ALT 0 - 44 U/L - 16 16   Studies/Results: CT ABDOMEN PELVIS WO CONTRAST  Result Date: 10/09/2020 CLINICAL DATA:  Left lower quadrant abdominal pain, nausea/vomiting/diarrhea for 3 days EXAM: CT ABDOMEN AND PELVIS WITHOUT CONTRAST TECHNIQUE: Multidetector CT imaging of the abdomen and pelvis was performed following the standard protocol without IV contrast. COMPARISON:  08/11/2019 FINDINGS: Lower chest: No acute pleural or parenchymal lung disease. Stable hiatal hernia. Unenhanced CT was performed per clinician order. Lack of IV contrast limits sensitivity and specificity, especially for evaluation of abdominal/pelvic solid viscera. Hepatobiliary: No focal liver abnormality is seen. No gallstones, gallbladder wall thickening, or biliary dilatation. Pancreas: Unremarkable. No pancreatic ductal dilatation or surrounding inflammatory changes. Spleen: Normal in size without focal abnormality. Adrenals/Urinary Tract: Stable fat containing left adrenal lesion measuring 2.7 cm consistent with angiomyolipoma. Stable benign cyst lower pole left kidney. No urinary tract calculi or obstructive uropathy within either kidney. Bladder is unremarkable. The adrenals are normal. Stomach/Bowel: No bowel obstruction or ileus. Diverticulosis of the descending and sigmoid colon again identified, with no evidence of acute diverticulitis. Evaluation of the bowel is limited by the lack of intravenous and oral contrast. No obvious bowel wall thickening or inflammatory change. Vascular/Lymphatic: Aortic atherosclerosis. No enlarged abdominal or pelvic lymph nodes. Reproductive: Status post hysterectomy. No adnexal masses. Other: No free  fluid or free gas.  No abdominal wall hernia. Musculoskeletal: No acute or destructive bony lesions. Stable degenerative changes of the lumbosacral junction. Reconstructed images demonstrate no additional findings. IMPRESSION: 1. Distal colonic diverticulosis with no evidence of acute diverticulitis. 2. 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 3. Stable hiatal hernia. 4.  Aortic Atherosclerosis (ICD10-I70.0). Electronically Signed   By: Randa Ngo M.D.   On: 10/09/2020 20:14   US RENAL  Result Date: 10/09/2020 CLINICAL DATA:  Acute renal insufficiency EXAM: RENAL / URINARY TRACT ULTRASOUND COMPLETE COMPARISON:  10/09/2020, 08/11/2019 FINDINGS: Right Kidney: Renal measurements: 9.1 x 4.3 x 5.0 cm = volume: 101.8 mL. Increased renal cortical echotexture. No mass or hydronephrosis visualized. Left Kidney: Renal measurements: 9.4 x 4.5 by 5.1 cm = volume: 112.8 mL. Echogenicity within normal limits. 3.5 cm cyst lower pole left kidney. The left renal angiomyolipoma seen on recent CT not well visualized by ultrasound. No hydronephrosis. Bladder: Appears normal for degree of bladder distention. Other: None. IMPRESSION: 1. Increased renal cortical echotexture right kidney, consistent with medical renal disease. 2. Simple left renal cyst. Left renal angiomyolipoma seen on preceding CT not well visualized on this study. Electronically Signed   By: Randa Ngo M.D.   On: 10/09/2020 20:34    Principal Problem:   Acute renal failure superimposed on stage 3a chronic kidney disease ([name]) Active Problems:   Hypertension   Gastroenteritis   Type 2 diabetes mellitus with stage 3 chronic kidney disease, without long-term current use of insulin ([name])   Mixed hyperlipidemia  Aortic atherosclerosis ([name])   Hyperkalemia   Prolonged QT interval   Hyponatremia   Acute kidney injury superimposed on chronic kidney disease ([place])    Tye Savoy, NP-C @  10/11/2020, 10:09 AM

## 2020-10-11 NOTE — Progress Notes (Signed)
Pt ate 1 piece of toast, 1 apple juice and small amount of coffee. With medications patient became nauseated and vomited. PRN medication administered. MD on unit and informed.

## 2020-10-11 NOTE — Plan of Care (Signed)
  Problem: Clinical Measurements: Goal: Will remain free from infection Outcome: Progressing   Problem: Nutrition: Goal: Adequate nutrition will be maintained Outcome: Progressing   Problem: Clinical Measurements: Goal: Ability to maintain clinical measurements within normal limits will improve Outcome: Progressing

## 2020-10-11 NOTE — Plan of Care (Signed)
  Problem: Activity: Goal: Risk for activity intolerance will decrease Outcome: Progressing   Problem: Nutrition: Goal: Adequate nutrition will be maintained Outcome: Progressing   Problem: Health Behavior/Discharge Planning: Goal: Ability to manage health-related needs will improve Outcome: Progressing

## 2020-10-12 ENCOUNTER — Inpatient Hospital Stay (HOSPITAL_COMMUNITY): Payer: Medicare Other | Admitting: Anesthesiology

## 2020-10-12 ENCOUNTER — Encounter (HOSPITAL_COMMUNITY)
Admission: EM | Disposition: A | Discharge status: Home Health | Payer: Self-pay | Source: Home / Self Care | Attending: Internal Medicine

## 2020-10-12 ENCOUNTER — Encounter (HOSPITAL_COMMUNITY): Payer: Self-pay | Admitting: Internal Medicine

## 2020-10-12 DIAGNOSIS — K449 Diaphragmatic hernia without obstruction or gangrene: Secondary | ICD-10-CM

## 2020-10-12 DIAGNOSIS — K2100 Gastro-esophageal reflux disease with esophagitis, without bleeding: Secondary | ICD-10-CM

## 2020-10-12 DIAGNOSIS — K2210 Ulcer of esophagus without bleeding: Secondary | ICD-10-CM

## 2020-10-12 DIAGNOSIS — K208 Other esophagitis without bleeding: Secondary | ICD-10-CM

## 2020-10-12 HISTORY — PX: ESOPHAGOGASTRODUODENOSCOPY (EGD) WITH PROPOFOL: SHX5813

## 2020-10-12 LAB — GLUCOSE, CAPILLARY
Glucose-Capillary: 102 mg/dL — ABNORMAL HIGH (ref 70–99)
Glucose-Capillary: 81 mg/dL (ref 70–99)
Glucose-Capillary: 113 mg/dL — ABNORMAL HIGH (ref 70–99)
Glucose-Capillary: 127 mg/dL — ABNORMAL HIGH (ref 70–99)

## 2020-10-12 LAB — BASIC METABOLIC PANEL
Anion gap: 6 (ref 5–15)
BUN: 18 mg/dL (ref 8–23)
CO2: 16 mmol/L — ABNORMAL LOW (ref 22–32)
Calcium: 8.6 mg/dL — ABNORMAL LOW (ref 8.9–10.3)
Chloride: 115 mmol/L — ABNORMAL HIGH (ref 98–111)
Creatinine, Ser: 0.74 mg/dL (ref 0.44–1.00)
GFR, Estimated: >60 mL/min (ref >60)
Glucose, Bld: 101 mg/dL — ABNORMAL HIGH (ref 70–99)
Potassium: 4.3 mmol/L (ref 3.5–5.1)
Sodium: 137 mmol/L (ref 135–145)

## 2020-10-12 LAB — MRSA PCR SCREENING: MRSA by PCR: NEGATIVE

## 2020-10-12 SURGERY — ESOPHAGOGASTRODUODENOSCOPY (EGD) WITH PROPOFOL
Anesthesia: Monitor Anesthesia Care

## 2020-10-12 MED ORDER — PROPOFOL 10 MG/ML IV BOLUS
INTRAVENOUS | Status: DC | PRN
  Administered 2020-10-12 (×4): 40 mg via INTRAVENOUS

## 2020-10-12 MED ORDER — LACTATED RINGERS IV SOLN: INTRAVENOUS | Status: DC | PRN

## 2020-10-12 MED ORDER — STERILE WATER FOR INJECTION IV SOLN: INTRAVENOUS | Status: DC

## 2020-10-12 MED ORDER — PROPOFOL 500 MG/50ML IV EMUL
INTRAVENOUS | Status: AC
  Filled 2020-10-12: qty 50

## 2020-10-12 MED ORDER — LIDOCAINE 2% (20 MG/ML) 5 ML SYRINGE
INTRAMUSCULAR | Status: DC | PRN
  Administered 2020-10-12: 40 mg via INTRAVENOUS

## 2020-10-12 MED ORDER — STERILE WATER FOR INJECTION IV SOLN
INTRAVENOUS | Status: DC
  Filled 2020-10-12: qty 850
  Filled 2020-10-12: qty 150

## 2020-10-12 MED ORDER — SUCRALFATE 1 GM/10ML PO SUSP
1.0000 g | Freq: Three times a day (TID) | ORAL | Status: DC
  Administered 2020-10-12 – 2020-10-13 (×4): 1 g via ORAL
  Filled 2020-10-12 (×4): qty 10

## 2020-10-12 MED ORDER — PANTOPRAZOLE SODIUM 40 MG PO TBEC
40.0000 mg | DELAYED_RELEASE_TABLET | Freq: Two times a day (BID) | ORAL | Status: DC
  Administered 2020-10-12 – 2020-10-13 (×2): 40 mg via ORAL
  Filled 2020-10-12 (×2): qty 1

## 2020-10-12 SURGICAL SUPPLY — 15 items

## 2020-10-12 NOTE — Transfer of Care (Signed)
Immediate Anesthesia Transfer of Care Note  Patient: Anna Hoffman  Procedure(s) Performed: ESOPHAGOGASTRODUODENOSCOPY (EGD) WITH PROPOFOL (N/A )  Patient Location: PACU and Endoscopy Unit  Anesthesia Type:MAC  Level of Consciousness: awake and alert   Airway & Oxygen Therapy: Patient Spontanous Breathing and Patient connected to face mask oxygen  Post-op Assessment: Report given to RN and Post -op Vital signs reviewed and stable  Post vital signs: Reviewed and stable  Last Vitals:  Vitals Value Taken Time  BP    Temp    Pulse    Resp 18 10/12/20 1106  SpO2    Vitals shown include unvalidated device data.  Last Pain:  Vitals:   10/12/20 0955  TempSrc: Oral  PainSc: 0-No pain      Patients Stated Pain Goal: 3 (59/93/57 0177)  Complications: No complications documented.

## 2020-10-12 NOTE — Progress Notes (Signed)
Physical Therapy Treatment Patient Details Name: Anna Hoffman MRN: [id number] DOB: [birth year] Today's Date: 10/12/2020    History of Present Illness 86 y.o. female with PMH significant of osteoarthritis, type II DM, hypertension, hypokalemia, history of pneumonia, history of varicella-zoster, history of thalamic hemorrhagic stroke who is coming to the [facility] due to nausea, vomiting and diarrhea for the past 3 days    PT Comments    Pt assisted to bathroom and then ambulated in hallway.  Pt with improved stability using RW and encouraged to use RW at home upon d/c for improved safety while recovering.  Pt agreeable and states she has family to assist her if needed at home.    Follow Up Recommendations  Home health PT;Supervision/Assistance - 24 hour     Equipment Recommendations  None recommended by PT    Recommendations for Other Services       Precautions / Restrictions Precautions Precautions: Fall    Mobility  Bed Mobility Overal bed mobility: Needs Assistance Bed Mobility: Supine to Sit     Supine to sit: Supervision Sit to supine: Supervision   General bed mobility comments: increased time    Transfers Overall transfer level: Needs assistance Equipment used: Rolling walker (2 wheeled) Transfers: Sit to/from Stand Sit to Stand: Min assist         General transfer comment: min/guard from bed however min assist from toilet with grab bar, cues for hand placement  Ambulation/Gait Ambulation/Gait assistance: Min guard Gait Distance (Feet): 200 Feet Assistive device: Rolling walker (2 wheeled) Gait Pattern/deviations: Step-through pattern;Decreased stride length Gait velocity: decreased   General Gait Details: pt used quad cane into bathroom however reaching for things to hold with other hand so used RW for hallway ambulation and stability improved; pt with increased Lt LE toe out and decreased DF however pt reports this is baseline after CVA   Stairs              Wheelchair Mobility    Modified Rankin (Stroke Patients Only)       Balance                                            Cognition Arousal/Alertness: Awake/alert Behavior During Therapy: WFL for tasks assessed/performed Overall Cognitive Status: Within Functional Limits for tasks assessed                                        Exercises      General Comments        Pertinent Vitals/Pain      Home Living                      Prior Function            PT Goals (current goals can now be found in the care plan section) Progress towards PT goals: Progressing toward goals    Frequency    Min 3X/week      PT Plan Current plan remains appropriate    Co-evaluation              AM-PAC PT "6 Clicks" Mobility   Outcome Measure  Help needed turning from your back to your side while in a flat bed without using bedrails?: A Little Help needed  moving from lying on your back to sitting on the side of a flat bed without using bedrails?: A Little Help needed moving to and from a bed to a chair (including a wheelchair)?: A Little Help needed standing up from a chair using your arms (e.g., wheelchair or bedside chair)?: A Little Help needed to walk in hospital room?: A Little Help needed climbing 3-5 steps with a railing? : A Lot 6 Click Score: 17    End of Session Equipment Utilized During Treatment: Gait belt Activity Tolerance: Patient tolerated treatment well Patient left: with call bell/phone within reach;in bed;with bed alarm set   PT Visit Diagnosis: Other abnormalities of gait and mobility (R26.89)     Time: 7915-0413 PT Time Calculation (min) (ACUTE ONLY): 21 min  Charges:  $Gait Training: 8-22 mins                     Arlyce Dice, DPT Acute Rehabilitation Services Pager: [phone] Office: [phone]  York Ram E 10/12/2020, 3:44 PM

## 2020-10-12 NOTE — Progress Notes (Addendum)
[facility] Progress Note  Subjective: seen in room, vomiting and diarrhea have subsided. She feels a lot better. EGD showed severe erosive esophagitis and wide-open EG junction. Asked to see for low CO2 on labs.   Vitals:   10/12/20 1110 10/12/20 1120 10/12/20 1130 10/12/20 1212  BP: (!) 116/49 (!) 125/54 (!) 137/57 (!) 149/83  Pulse: 78 77 77 76  Resp: 17 19 14 16   Temp: 97.7 F (36.5 C)   97.9 F (36.6 C)  TempSrc: Oral   Oral  SpO2: 94% 96% 96% 96%  Weight:      Height:        Exam:  alert, nad   no jvd  Chest cta bilat  Cor reg no RG  Abd soft ntnd no ascites   Ext no LE edema   Alert, NF, ox3     UA 3/8- many bact, 0-5 rbc, 11-20 wbc   Renal US - no hydro either side, 2 kidneys present   Assessment/ Plan: 1. AKI - due to vol depletion (N/V/D) in setting of ARB use. Peak creat was 1.18.  Creat back to normal. Avoid ACEi/ ARB for 2-4 weeks.  2. Metabolic acidosis - CO2 up to 16. Cause is diarrheal illness causing bicarbonate loss. Urine AG is zero. ABG shows proper resp compensation. Have switched NS IVF"s to bicarb at same rate. Also cont 1300 sod bicarb po bid if tolerating, if not can lower the dose. Asymptomatic issue which will correct over the next 1-2 wks w/ po bicarb and time assuming GI status is stable. If patient dc'd, would give 1- 2 weeks of po sod bicarb at 650 bid and consider PCP f/u for labs in a week or two. Will sign off.  3. Hyperkalemia - resolved 4. HTN - cont norvasc, use coreg if needed 5. N/V/D - per primary team 6. Hyponatremia - stable     Rob Schertz 10/12/2020, 2:51 PM   Recent Labs  Lab 10/10/20 0411 10/11/20 0425 10/12/20 0440  K 4.1 3.5 4.3  BUN 24* 19 18  CREATININE 0.99 0.86 0.74  CALCIUM 9.1 8.8* 8.6*  HGB 13.1 12.5  --    Inpatient medications: . amLODipine  10 mg Oral Daily  . vitamin C  1,000 mg Oral Daily  . aspirin EC  81 mg Oral Daily  . carvedilol  3.125 mg Oral BID WC  . enoxaparin (LOVENOX)  injection  40 mg Subcutaneous Q24H  . insulin aspart  0-9 Units Subcutaneous TID WC  . loratadine  10 mg Oral Daily  . pantoprazole  40 mg Oral BID AC  . rosuvastatin  20 mg Oral Daily  . sodium bicarbonate  1,300 mg Oral BID  . sucralfate  1 g Oral TID WC & HS  . vitamin B-12  100 mcg Oral Daily   .  sodium bicarbonate (isotonic) infusion in sterile water 75 mL/hr at 10/12/20 1301   acetaminophen **OR** acetaminophen, azelastine, benzonatate, lip balm, polyvinyl alcohol, prochlorperazine

## 2020-10-12 NOTE — Op Note (Signed)
[facility] Patient Name: Anna Hoffman Procedure Date: 10/12/2020 MRN: [id number] Attending MD: Mauri Pole , MD Date of Birth: [birth year] CSN: [id number] Age: 86 Admit Type: Inpatient Procedure:                Upper GI endoscopy Indications:              Dysphagia, Epigastric abdominal pain, Dyspepsia,                            Esophageal reflux symptoms that persist despite                            appropriate therapy Providers:                Mauri Pole, MD, Cleda Daub, RN, Cherylynn Ridges, Technician, Glenis Smoker, CRNA Referring MD:              Medicines:                Monitored Anesthesia Care Complications:            No immediate complications. Estimated Blood Loss:     Estimated blood loss was minimal. Procedure:                Pre-Anesthesia Assessment:                           - Prior to the procedure, a History and Physical                            was performed, and patient medications and                            allergies were reviewed. The patient's tolerance of                            previous anesthesia was also reviewed. The risks                            and benefits of the procedure and the sedation                            options and risks were discussed with the patient.                            All questions were answered, and informed consent                            was obtained. Prior Anticoagulants: The patient has                            taken no previous anticoagulant or antiplatelet  agents. ASA Grade Assessment: III - A patient with                            severe systemic disease. After reviewing the risks                            and benefits, the patient was deemed in                            satisfactory condition to undergo the procedure.                           After obtaining informed consent, the endoscope was                             passed under direct vision. Throughout the                            procedure, the patient's blood pressure, pulse, and                            oxygen saturations were monitored continuously. The                            GIF-H190 ([id number]) Olympus gastroscope was                            introduced through the mouth, and advanced to the                            second part of duodenum. The upper GI endoscopy was                            accomplished without difficulty. The patient                            tolerated the procedure well. Scope In: Scope Out: Findings:      LA Grade C (one or more mucosal breaks continuous between tops of 2 or       more mucosal folds, less than 75% circumference) esophagitis with no       bleeding was found 31 to 36 cm from the incisors.      The gastroesophageal flap valve was visualized endoscopically and       classified as Hill Grade IV (no fold, wide open lumen, hiatal hernia       present).      A 5 cm hiatal hernia was present.      The stomach was normal.      The examined duodenum was normal. Impression:               - LA Grade C reflux and erosive esophagitis with no                            bleeding.                           -  Gastroesophageal flap valve classified as Hill                            Grade IV (no fold, wide open lumen, hiatal hernia                            present).                           - 5 cm hiatal hernia.                           - Normal stomach.                           - Normal examined duodenum.                           - No specimens collected. Moderate Sedation:      Not Applicable - Patient had care per Anesthesia. Recommendation:           - Patient has a contact number available for                            emergencies. The signs and symptoms of potential                            delayed complications were discussed with the                            patient. Return to  normal activities tomorrow.                            Written discharge instructions were provided to the                            patient.                           - Resume previous diet.                           - Continue present medications.                           - No aspirin, ibuprofen, naproxen, or other                            non-steroidal anti-inflammatory drugs.                           - Follow an antireflux regimen.                           - Use Protonix (pantoprazole) 40 mg PO BID for 3  months followed by Protonix 40mg  daily.                           - Use sucralfate suspension 1 gram PO QID for 4                            weeks.                           - Follow up in GI office, next available                            appointemnt in 6-8 weeks.                           - GI will sign off but available if have any                            further questions. Procedure Code(s):        --- Professional ---                           [phone], Esophagogastroduodenoscopy, flexible,                            transoral; diagnostic, including collection of                            specimen(s) by brushing or washing, when performed                            (separate procedure) Diagnosis Code(s):        --- Professional ---                           K21.00, Gastro-esophageal reflux disease with                            esophagitis, without bleeding                           K20.80, Other esophagitis without bleeding                           K44.9, Diaphragmatic hernia without obstruction or                            gangrene                           R13.10, Dysphagia, unspecified                           R10.13, Epigastric pain CPT copyright 2019 [employer]. All rights reserved. The codes documented in this report are preliminary and upon coder review may  be revised to meet current compliance  requirements. Mauri Pole, MD 10/12/2020 11:12:29 AM This report has been  signed electronically. Number of Addenda: 0

## 2020-10-12 NOTE — Progress Notes (Signed)
PT Cancellation Note  Patient Details Name: Anna Hoffman MRN: [id number] DOB: Dec 30, 1934   Cancelled Treatment:    Reason Eval/Treat Not Completed: Patient at procedure or test/unavailable   Salena Ortlieb,KATHrine E 10/12/2020, 10:03 AM Arlyce Dice, DPT Acute Rehabilitation Services Pager: [phone] Office: [phone]

## 2020-10-12 NOTE — Anesthesia Preprocedure Evaluation (Addendum)
Anesthesia Evaluation  Patient identified by MRN, date of birth, ID band Patient awake    Reviewed: Allergy & Precautions, NPO status , Patient's Chart, lab work & pertinent test results  History of Anesthesia Complications Negative for: history of anesthetic complications  Airway Mallampati: II  TM Distance: >3 FB Neck ROM: Full    Dental  (+) Dental Advisory Given, Chipped   Pulmonary neg pulmonary ROS,    Pulmonary exam normal        Cardiovascular hypertension, Pt. on medications Normal cardiovascular exam   '20 TTE - EF 70 to 75%. Left ventricular septal wall thickness was mildly increased. LV diastolic Doppler parameters are consistent with impaired relaxation pattern of LV diastolic filling. Aortic valve regurgitation is trivial by color flow Doppler. Mild aortic valve sclerosis without stenosis.    Neuro/Psych CVA, No Residual Symptoms negative psych ROS   GI/Hepatic Neg liver ROS, hiatal hernia,   Endo/Other  diabetes, Type 2  Renal/GU CRFRenal disease     Musculoskeletal  (+) Arthritis ,   Abdominal   Peds  Hematology negative hematology ROS (+)   Anesthesia Other Findings Covid test negative   Reproductive/Obstetrics                            Anesthesia Physical Anesthesia Plan  ASA: III  Anesthesia Plan: MAC   Post-op Pain Management:    Induction: Intravenous  PONV Risk Score and Plan: 2 and Propofol infusion and Treatment may vary due to age or medical condition  Airway Management Planned: Nasal Cannula and Natural Airway  Additional Equipment: None  Intra-op Plan:   Post-operative Plan:   Informed Consent: I have reviewed the patients History and Physical, chart, labs and discussed the procedure including the risks, benefits and alternatives for the proposed anesthesia with the patient or authorized representative who has indicated his/her understanding and  acceptance.       Plan Discussed with: CRNA and Anesthesiologist  Anesthesia Plan Comments:        Anesthesia Quick Evaluation

## 2020-10-12 NOTE — Anesthesia Procedure Notes (Signed)
Procedure Name: MAC Date/Time: 10/12/2020 10:37 AM Performed by: Cynda Familia, CRNA Pre-anesthesia Checklist: Patient identified, Emergency Drugs available, Suction available and Timeout performed Patient Re-evaluated:Patient Re-evaluated prior to induction Oxygen Delivery Method: Simple face mask Placement Confirmation: positive ETCO2 and breath sounds checked- equal and bilateral Dental Injury: Teeth and Oropharynx as per pre-operative assessment  Comments: Bite block by RN

## 2020-10-12 NOTE — Care Management Important Message (Signed)
Important Message  Patient Details IM Letter placed in Patient's room. Name: Anna Hoffman MRN: [id number] Date of Birth: 02-05-1935   Medicare Important Message Given:  Yes     Kerin Salen 10/12/2020, 10:59 AM

## 2020-10-12 NOTE — Progress Notes (Signed)
PROGRESS NOTE   Anna Hoffman  IWP:[id number]    DOB: [birth year]    DOA: 10/09/2020  PCP: Glendale Chard, MD   I have briefly reviewed patients previous medical records in [facility].  Chief Complaint  Patient presents with  . Emesis  . Diarrhea    Brief Narrative:  86 year old female with medical history including but not limited to osteoarthritis, type II DM, HTN, hypokalemia, pneumonia, varicella-zoster, thalamic hemorrhagic stroke who presented to the ED due to nausea, vomiting and diarrhea of 3 days duration.  She was admitted for acute viral gastroenteritis with associated dehydration with hyponatremia, acute kidney injury, hyperkalemia, non-anion gap metabolic acidosis.  Nephrology consulted.  AKI and hyperkalemia resolved.  Persisting NAG metabolic acidosis, nephrology consulted again, started on IV bicarb drip.  [facility] consulted to evaluate couple of weeks history of poor oral intake,?  Dysphagia, early satiety, nausea and vomiting and inability to keep food down.  S/p EGD 3/11 shows erosive esophagitis.   Assessment & Plan:  Principal Problem:   Acute renal failure superimposed on stage 3a chronic kidney disease ([name]) Active Problems:   Hypertension   Gastroenteritis   Type 2 diabetes mellitus with stage 3 chronic kidney disease, without long-term current use of insulin ([name])   Mixed hyperlipidemia   Aortic atherosclerosis ([name])   Hyperkalemia   Prolonged QT interval   Hyponatremia   Acute kidney injury superimposed on chronic kidney disease ([place])   Acute kidney injury complicating stage IIIa CKD  Creatinine 1.08 in November 2021.  Presented with creatinine of 1.79, sodium 131, potassium greater than 7.5, bicarbonate 13 anion gap of 9.  BUN 42.  Likely precipitated by acute viral gastroenteritis, dehydration and ongoing ARB use.  CT abdomen and pelvis without contrast: No acute findings.  Renal ultrasound: Increased right cortical echotexture right kidney  consistent with medical renal disease.  ARB held.  Treated with IV fluids.    Nephrology follow-up appreciated.  I discussed with Dr. Melvia Heaps 3/9, recommends continuing IV fluids for at least another day until eating well, avoid ACEI/ARB for 2 to 4 weeks, no renal follow-up recommended.  He also recommends oral bicarbonate 650 twice daily for 5 to 7 days.  AKI resolved.  Hyperkalemia  Secondary to AKI and ARB use  Treated with IV fluid bolus, albuterol inhalations, insulin dextrose, calcium gluconate and Lokelma  Resolved.  Continuing to hold ARB  NAG metabolic acidosis  Bicarbonate persistently low while anion gap is normal.  Likely secondary to recent prolonged GI losses.  Patient reports that her PCP had her on oral bicarbonate supplements, wonder if this is a chronic issue for her.  As discussed with nephrology, initially treated with oral bicarbonate tablets and bicarbonate went up from 13-16.  They advised checking ABG, urine electrolytes (sodium, potassium and chloride).  Nephrology has started patient on bicarbonate infusion IV.  Follow BMP in a.m.  Dehydration with hyponatremia  Resolved after IV fluids  Leukocytosis  Suspect stress response.  Resolved.  Essential hypertension:  Irbesartan held due to AKI.  Continue amlodipine 10 mg daily and carvedilol 3.125 mg twice daily  Controlled  Nausea, vomiting, early satiety, possible dysphagia  Current symptoms likely not related to viral GE but some other upper GI issue that warrants further evaluation.  [facility] consulted and plan EGD in a.m.  S/p hiatal hernia repair in 2016.  [facility] consulted, s/p EGD 3/11 which shows erosive esophagitis.  GI recommends Protonix 40 mg twice daily x14-month followed by  40 mg once daily, sucralfate suspension 1 g 4 times daily x4 weeks and outpatient follow-up with them in 6 to 8 weeks.  Type II DM with stage III CKD without long-term insulin use  A1c 6.7 suggests good  outpatient control.  SSI.  Dyslipidemia  Continue Crestor.  Aortic atherosclerosis  Continue Crestor.  Prolonged QTC  Resolved.  EKG on 3/11: QTC 450 ms.  Avoid QT prolonging medications as much as possible.  Stable left renal angiomyolipoma and left renal cyst  Noted on CT abdomen.  Outpatient follow-up as deemed necessary.  Body mass index is 22.34 kg/m.   DVT prophylaxis: enoxaparin (LOVENOX) injection 40 mg Start: 10/10/20 1000     Code Status: Full Code Family Communication: I discussed in detail with patient's son via phone on 3/11, updated care and answered all questions.  Advised him of potential discharge home tomorrow. Disposition:  Status is: Inpatient  The patient will require care spanning > 2 midnights and should be moved to inpatient because: IV treatments appropriate due to intensity of illness or inability to take PO  Dispo: The patient is from: Home              Anticipated d/c is to: Home hopefully 3/12 pending ability to tolerate diet and improvement in NAG metabolic acidosis.              Patient currently is not medically stable to d/c.   Difficult to place patient No        Consultants:   Nephrology  GI  Procedures:   EGD 10/12/2020:  Impression: - LA Grade C reflux and erosive esophagitis with no bleeding. - Gastroesophageal flap valve classified as Hill Grade IV (no fold, wide open lumen, hiatal hernia present). - 5 cm hiatal hernia. - Normal stomach. - Normal examined duodenum. - No specimens collected.  Recommendations:  - Resume previous diet. - Continue present medications. - No aspirin, ibuprofen, naproxen, or other non-steroidal anti-inflammatory drugs. - Follow an antireflux regimen. - Use Protonix (pantoprazole) 40 mg PO BID for 3 months followed by Protonix 40mg  daily. - Use sucralfate suspension 1 gram PO QID for 4 weeks. - Follow up in GI office, next available appointemnt in 6-8 weeks. - GI will sign  off but available if have any further questions.  Antimicrobials:    Anti-infectives (From admission, onward)   None        Subjective:  Patient seen this afternoon post procedure.  Reports that she feels "good".  Waiting to eat something.  States that she did not sleep well last night due to interruptions from blood draw etc.  Did not have any vomiting last night.  Discussed EGD findings and recommendations.  States that she is already on an acid pill.  Objective:   Vitals:   10/12/20 1110 10/12/20 1120 10/12/20 1130 10/12/20 1212  BP: (!) 116/49 (!) 125/54 (!) 137/57 (!) 149/83  Pulse: 78 77 77 76  Resp: 17 19 14 16   Temp: 97.7 F (36.5 C)   97.9 F (36.6 C)  TempSrc: Oral   Oral  SpO2: 94% 96% 96% 96%  Weight:      Height:        General exam: Elderly female, moderately built and thinly nourished lying comfortably propped up in bed.  Oral mucosa moist. Respiratory system: Clear to auscultation.  No increased work of breathing. Cardiovascular system: S1 and S2 heard, RRR.  No JVD, murmurs or pedal edema.  Telemetry personally reviewed: Sinus rhythm.  Saved strip from 3/9 at 10:56 AM had been reviewed by me with Cardiology on the same day who indicated that this was Mobitz type I second-degree AV block/Wenckebach's. Gastrointestinal system: Abdomen is nondistended, soft and nontender.  No organomegaly or masses appreciated.  Normal bowel sounds heard Central nervous system: Alert and oriented. No focal neurological deficits. Extremities: Symmetric 5 x 5 power. Skin: No rashes, lesions or ulcers Psychiatry: Judgement and insight somewhat impaired but this may be chronic. Mood & affect appropriate.     Data Reviewed:   I have personally reviewed following labs and imaging studies   CBC: Recent Labs  Lab 10/09/20 1514 10/10/20 0411 10/11/20 0425  WBC 13.2* 19.5* 10.5  HGB 14.8 13.1 12.5  HCT 45.9 39.9 38.6  MCV 92.2 91.9 92.6  PLT 372 331 270    Basic Metabolic  Panel: Recent Labs  Lab 10/10/20 0411 10/11/20 0425 10/12/20 0440  NA 138 135 137  K 4.1 3.5 4.3  CL 116* 112* 115*  CO2 13* 14* 16*  GLUCOSE 105* 87 101*  BUN 24* 19 18  CREATININE 0.99 0.86 0.74  CALCIUM 9.1 8.8* 8.6*  MG 2.0  --   --     Liver Function Tests: Recent Labs  Lab 10/09/20 1514 10/09/20 1932 10/10/20 0411  AST 54* 30 20  ALT 28 16 16   ALKPHOS 60 55 52  BILITOT <0.1* 1.8* 0.8  PROT 7.6 7.2 6.6  ALBUMIN 4.2 4.0 3.7    CBG: Recent Labs  Lab 10/11/20 2135 10/12/20 0801 10/12/20 1207  GLUCAP 129* 102* 81    Microbiology Studies:   Recent Results (from the past 240 hour(s))  Resp Panel by RT-PCR (Flu A&B, Covid) Nasopharyngeal Swab     Status: None   Collection Time: 10/09/20  3:32 PM   Specimen: Nasopharyngeal Swab; Nasopharyngeal(NP) swabs in vial transport medium  Result Value Ref Range Status   SARS Coronavirus 2 by RT PCR NEGATIVE NEGATIVE Final    Comment: (NOTE) SARS-CoV-2 target nucleic acids are NOT DETECTED.  The SARS-CoV-2 RNA is generally detectable in upper respiratory specimens during the acute phase of infection. The lowest concentration of SARS-CoV-2 viral copies this assay can detect is 138 copies/mL. A negative result does not preclude SARS-Cov-2 infection and should not be used as the sole basis for treatment or other patient management decisions. A negative result may occur with  improper specimen collection/handling, submission of specimen other than nasopharyngeal swab, presence of viral mutation(s) within the areas targeted by this assay, and inadequate number of viral copies(<138 copies/mL). A negative result must be combined with clinical observations, patient history, and epidemiological information. The expected result is Negative.  Fact Sheet for Patients:  EntrepreneurPulse.com.au  Fact Sheet for Healthcare Providers:  IncredibleEmployment.be  This test is no t yet approved  or cleared by the [place] FDA and  has been authorized for detection and/or diagnosis of SARS-CoV-2 by FDA under an Emergency Use Authorization (EUA). This EUA will remain  in effect (meaning this test can be used) for the duration of the COVID-19 declaration under Section 564(b)(1) of the Act, 21 U.S.C.section 360bbb-3(b)(1), unless the authorization is terminated  or revoked sooner.       Influenza A by PCR NEGATIVE NEGATIVE Final   Influenza B by PCR NEGATIVE NEGATIVE Final    Comment: (NOTE) The Xpert Xpress SARS-CoV-2/FLU/RSV plus assay is intended as an aid in the diagnosis of influenza from Nasopharyngeal swab specimens and should not be used as a  sole basis for treatment. Nasal washings and aspirates are unacceptable for Xpert Xpress SARS-CoV-2/FLU/RSV testing.  Fact Sheet for Patients: EntrepreneurPulse.com.au  Fact Sheet for Healthcare Providers: IncredibleEmployment.be  This test is not yet approved or cleared by the [place] FDA and has been authorized for detection and/or diagnosis of SARS-CoV-2 by FDA under an Emergency Use Authorization (EUA). This EUA will remain in effect (meaning this test can be used) for the duration of the COVID-19 declaration under Section 564(b)(1) of the Act, 21 U.S.C. section 360bbb-3(b)(1), unless the authorization is terminated or revoked.  Performed at [facility], [place] [address], [place], [place] [address]   MRSA PCR Screening     Status: None   Collection Time: 10/12/20  9:15 AM   Specimen: Nasal Mucosa; Nasopharyngeal  Result Value Ref Range Status   MRSA by PCR NEGATIVE NEGATIVE Final    Comment:        The GeneXpert MRSA Assay (FDA approved for NASAL specimens only), is one component of a comprehensive MRSA colonization surveillance program. It is not intended to diagnose MRSA infection nor to guide or monitor treatment for MRSA infections. Performed  at [facility], [place] [address] [place], [place] [address]      Radiology Studies:  No results found.   Scheduled Meds:   . amLODipine  10 mg Oral Daily  . vitamin C  1,000 mg Oral Daily  . aspirin EC  81 mg Oral Daily  . carvedilol  3.125 mg Oral BID WC  . enoxaparin (LOVENOX) injection  40 mg Subcutaneous Q24H  . insulin aspart  0-9 Units Subcutaneous TID WC  . loratadine  10 mg Oral Daily  . pantoprazole  40 mg Oral BID AC  . rosuvastatin  20 mg Oral Daily  . sodium bicarbonate  1,300 mg Oral BID  . sucralfate  1 g Oral TID WC & HS  . vitamin B-12  100 mcg Oral Daily    Continuous Infusions:   .  sodium bicarbonate (isotonic) infusion in sterile water 75 mL/hr at 10/12/20 1301     LOS: 2 days     Vernell Leep, MD, [place], [facility]. [facility]    To contact the attending provider between 7A-7P or the covering provider during after hours 7P-7A, please log into the web site [email or web address] and access using universal [place] password for that web site. If you do not have the password, please call the hospital operator.  10/12/2020, 1:02 PM

## 2020-10-12 NOTE — Anesthesia Postprocedure Evaluation (Signed)
Anesthesia Post Note  Patient: Anna Hoffman  Procedure(s) Performed: ESOPHAGOGASTRODUODENOSCOPY (EGD) WITH PROPOFOL (N/A )     Patient location during evaluation: PACU Anesthesia Type: MAC Level of consciousness: awake and alert Pain management: pain level controlled Vital Signs Assessment: post-procedure vital signs reviewed and stable Respiratory status: spontaneous breathing, nonlabored ventilation and respiratory function stable Cardiovascular status: stable and blood pressure returned to baseline Anesthetic complications: no   No complications documented.  Last Vitals:  Vitals:   10/12/20 1130 10/12/20 1212  BP: (!) 137/57 (!) 149/83  Pulse: 77 76  Resp: 14 16  Temp:  36.6 C  SpO2: 96% 96%    Last Pain:  Vitals:   10/12/20 1212  TempSrc: Oral  PainSc:                  Audry Pili

## 2020-10-12 NOTE — Interval H&P Note (Signed)
History and Physical Interval Note:  10/12/2020 9:52 AM  Anna Hoffman  has presented today for surgery, with the diagnosis of nausea, vomiting, dysphagia.  The various methods of treatment have been discussed with the patient and family. After consideration of risks, benefits and other options for treatment, the patient has consented to  Procedure(s): ESOPHAGOGASTRODUODENOSCOPY (EGD) WITH PROPOFOL (N/A) as a surgical intervention.  The patient's history has been reviewed, patient examined, no change in status, stable for surgery.  I have reviewed the patient's chart and labs.  Questions were answered to the patient's satisfaction.     Rhetta Cleek

## 2020-10-13 DIAGNOSIS — N183 Chronic kidney disease, stage 3 unspecified: Secondary | ICD-10-CM

## 2020-10-13 DIAGNOSIS — E1122 Type 2 diabetes mellitus with diabetic chronic kidney disease: Secondary | ICD-10-CM

## 2020-10-13 LAB — GLUCOSE, CAPILLARY
Glucose-Capillary: 91 mg/dL (ref 70–99)
Glucose-Capillary: 89 mg/dL (ref 70–99)

## 2020-10-13 LAB — BASIC METABOLIC PANEL
Anion gap: 7 (ref 5–15)
BUN: 15 mg/dL (ref 8–23)
CO2: 21 mmol/L — ABNORMAL LOW (ref 22–32)
Calcium: 8.6 mg/dL — ABNORMAL LOW (ref 8.9–10.3)
Chloride: 110 mmol/L (ref 98–111)
Creatinine, Ser: 1.03 mg/dL — ABNORMAL HIGH (ref 0.44–1.00)
GFR, Estimated: 54 mL/min — ABNORMAL LOW (ref >60)
Glucose, Bld: 114 mg/dL — ABNORMAL HIGH (ref 70–99)
Potassium: 3.7 mmol/L (ref 3.5–5.1)
Sodium: 138 mmol/L (ref 135–145)

## 2020-10-13 MED ORDER — CARVEDILOL 3.125 MG PO TABS: 3.1250 mg | ORAL_TABLET | Freq: Two times a day (BID) | ORAL | 0 refills | Status: DC

## 2020-10-13 MED ORDER — PANTOPRAZOLE SODIUM 40 MG PO TBEC: 40.0000 mg | DELAYED_RELEASE_TABLET | Freq: Two times a day (BID) | ORAL | 1 refills | Status: DC

## 2020-10-13 MED ORDER — SUCRALFATE 1 GM/10ML PO SUSP: 1.0000 g | Freq: Three times a day (TID) | ORAL | 0 refills | Status: DC

## 2020-10-13 MED ORDER — AZELASTINE HCL 0.1 % NA SOLN: 2.0000 | Freq: Every day | NASAL | Status: DC | PRN

## 2020-10-13 NOTE — Discharge Summary (Signed)
Physician Discharge Summary  Wafa Martes [facility] GQQ:[id number] DOB: 12-Aug-1934  PCP: Glendale Chard, MD  Admitted from: Home Discharged to: Home  Admit date: 10/09/2020 Discharge date: 10/13/2020  Recommendations for Outpatient Follow-up:    Follow-up Information    Care, [facility] Follow up.   Specialty: [place] Why: [facility] staff will call you to schedule in home therapy visits. Contact information: [place] [address] [phone]        Glendale Chard, MD. Schedule an appointment as soon as possible for a visit in 1 week(s).   Specialty: Internal Medicine Why: To be seen with repeat labs (CBC & BMP). Contact information: [address] [place] [address] [phone]        Mauri Pole, MD. Schedule an appointment as soon as possible for a visit.   Specialty: Gastroenterology Why: To be seen in 6 to 8 weeks. Contact information: [place] [address] [place]:  Home Health Orders (From admission, onward)    Start     Ordered   10/12/20 [place]  At discharge       Question Answer Comment  To provide the following care/treatments PT   To provide the following care/treatments OT      10/12/20 1306           Equipment/Devices: None    Discharge Condition: Improved and stable   Code Status: Full Code Diet recommendation:  Discharge Diet Orders (From admission, onward)    Start     Ordered   10/13/20 0000  Diet - low sodium heart healthy        10/13/20 1039   10/13/20 0000  Diet Carb Modified        10/13/20 1039           Discharge Diagnoses:  Principal Problem:   Acute renal failure superimposed on stage 3a chronic kidney disease ([place]) Active Problems:   Hypertension   Gastroenteritis   Type 2 diabetes mellitus with stage 3 chronic kidney disease, without long-term current use of insulin ([name])    Mixed hyperlipidemia   Aortic atherosclerosis ([name])   Hyperkalemia   Prolonged QT interval   Hyponatremia   Acute kidney injury superimposed on chronic kidney disease ([place])   Erosive esophagitis   Hiatal hernia   Brief Summary: 86 year old female with medical history including but not limited to osteoarthritis, type II DM-diet controlled, HTN, hypokalemia, pneumonia, varicella-zoster, thalamic hemorrhagic stroke who presented to the ED due to nausea, vomiting and diarrhea of 3 days duration.  She was admitted for acute viral gastroenteritis with associated dehydration with hyponatremia, acute kidney injury, hyperkalemia, non-anion gap metabolic acidosis.  Nephrology consulted.  AKI and hyperkalemia resolved.  Persisting NAG metabolic acidosis, nephrology consulted again, started on IV bicarb drip and bicarbonate has normalized.  [facility] consulted to evaluate couple of weeks history of poor oral intake,?  Dysphagia, early satiety, nausea and vomiting and inability to keep food down.  S/p EGD 3/11 shows erosive esophagitis.   Assessment & Plan:   Acute kidney injury complicating stage IIIa CKD  Creatinine 1.08 in November 2021.  Presented with creatinine of 1.79, sodium 131, potassium greater than 7.5, bicarbonate 13 anion gap of 9.  BUN 42.  Likely precipitated by acute viral gastroenteritis, dehydration and ongoing ARB use.  CT abdomen and  pelvis without contrast: No acute findings.  Renal ultrasound: Increased right cortical echotexture right kidney consistent with medical renal disease.  ARB held.  Treated with IV fluids.    Nephrology follow-up appreciated, recommended avoiding ACEI/ARB for 2 to 4 weeks, no renal follow-up recommended.    AKI resolved.  Periodically follow BMP as outpatient.  Hyperkalemia  Secondary to AKI and ARB use  Treated with IV fluid bolus, albuterol inhalations, insulin dextrose, calcium gluconate and Lokelma  Resolved.  Continuing to hold  ARB as noted above.  NAG metabolic acidosis  Bicarbonate persistently low while anion gap is normal.  Likely secondary to recent prolonged GI losses.  Patient reports that her PCP had her on oral bicarbonate supplements, wonder if this is a chronic issue for her.  As discussed with nephrology, initially treated with oral bicarbonate tablets and bicarbonate went up from 13-16.  They advised checking ABG, urine electrolytes (sodium, potassium and chloride).  Treated with bicarbonate infusion for a day and bicarbonate has normalized to 21 today.  Nephrology suspects that the NAG metabolic acidosis was caused by diarrheal illness with bicarbonate loss.  Urine AG 0 and ABG showed proper respiratory compensation.  I discussed with nephrology this morning: Since bicarbonate has normalized and patient no further has GI illness/diarrhea, no need to discharge home sodium bicarbonate supplements.  Follow BMP closely as outpatient.  Dehydration with hyponatremia  Resolved after IV fluids  Leukocytosis  Suspect stress response.  Resolved.  Essential hypertension:  Irbesartan held due to AKI.  Continue amlodipine 10 mg daily and carvedilol 3.125 mg twice daily  Controlled  Nausea, vomiting, early satiety, possible dysphagia  Current symptoms likely not related to viral GE but some other upper GI issue that warranted further evaluation.  [facility] consulted  S/p hiatal hernia repair in 2016.  [facility] consulted, s/p EGD 3/11 which shows erosive esophagitis.  GI recommends Protonix 40 mg twice daily x54-month followed by 40 mg once daily, sucralfate suspension 1 g 4 times daily x4 weeks and outpatient follow-up with them in 6 to 8 weeks.  Patient will need a refill of her Protonix prescription as outpatient.  Type II DM with stage III CKD without long-term insulin use  A1c 6.7 suggests good outpatient control.  Dyslipidemia  Continue Crestor.  Aortic  atherosclerosis  Continue Crestor.  Prolonged QTC  Resolved.  EKG on 3/11: QTC 450 ms.  Avoid QT prolonging medications as much as possible.   Periodically monitor EKG for QTC prolongation as outpatient.  Stable left renal angiomyolipoma and left renal cyst  Noted on CT abdomen.  Outpatient follow-up as deemed necessary.  Body mass index is 22.34 kg/m.      Consultants:   Nephrology [facility]  Procedures:   EGD 10/12/2020:  Impression: - LA Grade C reflux and erosive esophagitis with no bleeding. - Gastroesophageal flap valve classified as Hill Grade IV (no fold, wide open lumen, hiatal hernia present). - 5 cm hiatal hernia. - Normal stomach. - Normal examined duodenum. - No specimens collected.  Recommendations:  - Resume previous diet. - Continue present medications. - No aspirin, ibuprofen, naproxen, or other non-steroidal anti-inflammatory drugs. - Follow an antireflux regimen. - Use Protonix (pantoprazole) 40 mg PO BID for 3 months followed by Protonix 40mg  daily. - Use sucralfate suspension 1 gram PO QID for 4 weeks. - Follow up in GI office, next available appointemnt in 6-8 weeks. - GI will sign off but available if have any further questions.  Discharge Instructions  Discharge Instructions    Call MD for:  difficulty breathing, headache or visual disturbances   Complete by: As directed    Call MD for:  extreme fatigue   Complete by: As directed    Call MD for:  persistant dizziness or light-headedness   Complete by: As directed    Call MD for:  persistant nausea and vomiting   Complete by: As directed    Call MD for:  severe uncontrolled pain   Complete by: As directed    Diet - low sodium heart healthy   Complete by: As directed    Diet Carb Modified   Complete by: As directed    Increase activity slowly   Complete by: As directed        Medication List    STOP taking these medications   irbesartan 150 MG  tablet Commonly known as: AVAPRO   meclizine 12.5 MG tablet Commonly known as: ANTIVERT   olopatadine 0.1 % ophthalmic solution Commonly known as: Patanol   omeprazole 20 MG capsule Commonly known as: PRILOSEC   Pazeo 0.7 % Soln Generic drug: Olopatadine HCl   triamcinolone 0.1 % Commonly known as: KENALOG     TAKE these medications   acetaminophen 325 MG tablet Commonly known as: TYLENOL Take 2 tablets (650 mg total) by mouth every 6 (six) hours as needed for mild pain (or Fever >/= 101).   ALLEGRA PO Take 180 mg by mouth at bedtime.   amLODipine 10 MG tablet Commonly known as: NORVASC TAKE 1 TABLET BY MOUTH ONCE DAILY   aspirin EC 81 MG tablet Take 81 mg by mouth daily.   azelastine 0.1 % nasal spray Commonly known as: ASTELIN Place 2 sprays into both nostrils daily as needed for rhinitis.   benzonatate 100 MG capsule Commonly known as: Tessalon Perles Take 1 capsule (100 mg total) by mouth 3 (three) times daily as needed for cough.   carvedilol 3.125 MG tablet Commonly known as: COREG Take 1 tablet (3.125 mg total) by mouth 2 (two) times daily with a meal.   LUBRICATING EYE DROPS OP Apply 1 drop to eye daily as needed (dry eyes).   onetouch ultrasoft lancets Use as instructed to check blood sugar 2 times per day dx code e11.65   OneTouch Verio test strip Generic drug: glucose blood 1 each by Other route as needed for other. Use as instructed to check blood sugar 2 times per day dx: e11.65   pantoprazole 40 MG tablet Commonly known as: PROTONIX Take 1 tablet (40 mg total) by mouth 2 (two) times daily before a meal.   rosuvastatin 20 MG tablet Commonly known as: CRESTOR TAKE 1 TABLET BY MOUTH ONCE DAILY   sucralfate 1 GM/10ML suspension Commonly known as: CARAFATE Take 10 mLs (1 g total) by mouth 4 (four) times daily -  with meals and at bedtime for 28 days.   trolamine salicylate 10 % cream Commonly known as: ASPERCREME Apply 1 application  topically daily as needed for muscle pain.   vitamin B-12 100 MCG tablet Commonly known as: CYANOCOBALAMIN Take 100 mcg by mouth daily.   vitamin C 1000 MG tablet Take 1,000 mg by mouth daily.      Allergies  Allergen Reactions  . Amoxicillin Diarrhea    Has patient had a PCN reaction causing immediate rash, facial/tongue/throat swelling, SOB or lightheadedness with hypotension: no Has patient had a PCN reaction causing severe rash involving mucus membranes or skin necrosis: no  Has patient had a PCN reaction that required hospitalization: no pharmacist consult Has patient had a PCN reaction occurring within the last 10 years: yes If all of the above answers are "NO", then may proceed with Cephalosporin use.   . Ampicillin Rash    - Tolerates Rocephin and Ancef - Remote occurrence; no symptoms of anaphylaxis or severe cutaneous reaction, and no additional medical attention required    . Sulfa Antibiotics Rash      Procedures/Studies: CT ABDOMEN PELVIS WO CONTRAST  Result Date: 10/09/2020 CLINICAL DATA:  Left lower quadrant abdominal pain, nausea/vomiting/diarrhea for 3 days EXAM: CT ABDOMEN AND PELVIS WITHOUT CONTRAST TECHNIQUE: Multidetector CT imaging of the abdomen and pelvis was performed following the standard protocol without IV contrast. COMPARISON:  08/11/2019 FINDINGS: Lower chest: No acute pleural or parenchymal lung disease. Stable hiatal hernia. Unenhanced CT was performed per clinician order. Lack of IV contrast limits sensitivity and specificity, especially for evaluation of abdominal/pelvic solid viscera. Hepatobiliary: No focal liver abnormality is seen. No gallstones, gallbladder wall thickening, or biliary dilatation. Pancreas: Unremarkable. No pancreatic ductal dilatation or surrounding inflammatory changes. Spleen: Normal in size without focal abnormality. Adrenals/Urinary Tract: Stable fat containing left adrenal lesion measuring 2.7 cm consistent with  angiomyolipoma. Stable benign cyst lower pole left kidney. No urinary tract calculi or obstructive uropathy within either kidney. Bladder is unremarkable. The adrenals are normal. Stomach/Bowel: No bowel obstruction or ileus. Diverticulosis of the descending and sigmoid colon again identified, with no evidence of acute diverticulitis. Evaluation of the bowel is limited by the lack of intravenous and oral contrast. No obvious bowel wall thickening or inflammatory change. Vascular/Lymphatic: Aortic atherosclerosis. No enlarged abdominal or pelvic lymph nodes. Reproductive: Status post hysterectomy. No adnexal masses. Other: No free fluid or free gas.  No abdominal wall hernia. Musculoskeletal: No acute or destructive bony lesions. Stable degenerative changes of the lumbosacral junction. Reconstructed images demonstrate no additional findings. IMPRESSION: 1. Distal colonic diverticulosis with no evidence of acute diverticulitis. 2. 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 3. Stable hiatal hernia. 4.  Aortic Atherosclerosis (ICD10-I70.0). Electronically Signed   By: Randa Ngo M.D.   On: 10/09/2020 20:14   US RENAL  Result Date: 10/09/2020 CLINICAL DATA:  Acute renal insufficiency EXAM: RENAL / URINARY TRACT ULTRASOUND COMPLETE COMPARISON:  10/09/2020, 08/11/2019 FINDINGS: Right Kidney: Renal measurements: 9.1 x 4.3 x 5.0 cm = volume: 101.8 mL. Increased renal cortical echotexture. No mass or hydronephrosis visualized. Left Kidney: Renal measurements: 9.4 x 4.5 by 5.1 cm = volume: 112.8 mL. Echogenicity within normal limits. 3.5 cm cyst lower pole left kidney. The left renal angiomyolipoma seen on recent CT not well visualized by ultrasound. No hydronephrosis. Bladder: Appears normal for degree of bladder distention. Other: None. IMPRESSION: 1. Increased renal cortical echotexture right kidney, consistent with medical renal disease. 2. Simple left renal cyst. Left renal angiomyolipoma seen on  preceding CT not well visualized on this study. Electronically Signed   By: Randa Ngo M.D.   On: 10/09/2020 20:34      Subjective: Interviewed and examined patient along with her RN in the room.  Reports feeling "fine".  States that she has been eating "like a horse" and is asking "what have you been giving me" jokingly.  Denies nausea, vomiting, abdominal pain or diarrhea.  No GI symptoms for a couple days now.  Eager to DC home.  Discharge Exam:  Vitals:   10/12/20 1130 10/12/20 1212 10/12/20 2019 10/13/20 0507  BP: (!) 137/57 Mar[place] Kitchen)  149/83 (!) 145/76 138/80  Pulse: 77 76 83 83  Resp: 14 16 18 18   Temp:  97.9 F (36.6 C) 98.3 F (36.8 C) 98 F (36.7 C)  TempSrc:  Oral Oral Oral  SpO2: 96% 96% 97% 94%  Weight:    65.3 kg  Height:        General exam: Elderly female, moderately built and thinly nourished lying comfortably propped up in bed.  Oral mucosa moist.  Appears to be in good spirits this morning. Respiratory system: Clear to auscultation.  No increased work of breathing. Cardiovascular system: S1 and S2 heard, RRR.  No JVD, murmurs or pedal edema.  Gastrointestinal system: Abdomen is nondistended, soft and nontender.  No organomegaly or masses appreciated.  Normal bowel sounds heard Central nervous system: Alert and oriented. No focal neurological deficits. Extremities: Symmetric 5 x 5 power. Skin: No rashes, lesions or ulcers Psychiatry: Judgement and insight somewhat impaired but this may be chronic. Mood & affect appropriate.      The results of significant diagnostics from this hospitalization (including imaging, microbiology, ancillary and laboratory) are listed below for reference.     Microbiology: Recent Results (from the past 240 hour(s))  Resp Panel by RT-PCR (Flu A&B, Covid) Nasopharyngeal Swab     Status: None   Collection Time: 10/09/20  3:32 PM   Specimen: Nasopharyngeal Swab; Nasopharyngeal(NP) swabs in vial transport medium  Result Value Ref  Range Status   SARS Coronavirus 2 by RT PCR NEGATIVE NEGATIVE Final    Comment: (NOTE) SARS-CoV-2 target nucleic acids are NOT DETECTED.  The SARS-CoV-2 RNA is generally detectable in upper respiratory specimens during the acute phase of infection. The lowest concentration of SARS-CoV-2 viral copies this assay can detect is 138 copies/mL. A negative result does not preclude SARS-Cov-2 infection and should not be used as the sole basis for treatment or other patient management decisions. A negative result may occur with  improper specimen collection/handling, submission of specimen other than nasopharyngeal swab, presence of viral mutation(s) within the areas targeted by this assay, and inadequate number of viral copies(<138 copies/mL). A negative result must be combined with clinical observations, patient history, and epidemiological information. The expected result is Negative.  Fact Sheet for Patients:  EntrepreneurPulse.com.au  Fact Sheet for Healthcare Providers:  IncredibleEmployment.be  This test is no t yet approved or cleared by the [place] FDA and  has been authorized for detection and/or diagnosis of SARS-CoV-2 by FDA under an Emergency Use Authorization (EUA). This EUA will remain  in effect (meaning this test can be used) for the duration of the COVID-19 declaration under Section 564(b)(1) of the Act, 21 U.S.C.section 360bbb-3(b)(1), unless the authorization is terminated  or revoked sooner.       Influenza A by PCR NEGATIVE NEGATIVE Final   Influenza B by PCR NEGATIVE NEGATIVE Final    Comment: (NOTE) The Xpert Xpress SARS-CoV-2/FLU/RSV plus assay is intended as an aid in the diagnosis of influenza from Nasopharyngeal swab specimens and should not be used as a sole basis for treatment. Nasal washings and aspirates are unacceptable for Xpert Xpress SARS-CoV-2/FLU/RSV testing.  Fact Sheet for  Patients: EntrepreneurPulse.com.au  Fact Sheet for Healthcare Providers: IncredibleEmployment.be  This test is not yet approved or cleared by the [place] FDA and has been authorized for detection and/or diagnosis of SARS-CoV-2 by FDA under an Emergency Use Authorization (EUA). This EUA will remain in effect (meaning this test can be used) for the duration of the COVID-19 declaration  under Section 564(b)(1) of the Act, 21 U.S.C. section 360bbb-3(b)(1), unless the authorization is terminated or revoked.  Performed at [facility], [place] [address], [place], [place] [address]   MRSA PCR Screening     Status: None   Collection Time: 10/12/20  9:15 AM   Specimen: Nasal Mucosa; Nasopharyngeal  Result Value Ref Range Status   MRSA by PCR NEGATIVE NEGATIVE Final    Comment:        The GeneXpert MRSA Assay (FDA approved for NASAL specimens only), is one component of a comprehensive MRSA colonization surveillance program. It is not intended to diagnose MRSA infection nor to guide or monitor treatment for MRSA infections. Performed at [facility], [place] [address] [place], [place] [address]      Labs: CBC: Recent Labs  Lab 10/09/20 1514 10/10/20 0411 10/11/20 0425  WBC 13.2* 19.5* 10.5  HGB 14.8 13.1 12.5  HCT 45.9 39.9 38.6  MCV 92.2 91.9 92.6  PLT 372 331 048    Basic Metabolic Panel: Recent Labs  Lab 10/09/20 1932 10/10/20 0411 10/11/20 0425 10/12/20 0440 10/13/20 0335  NA 137 138 135 137 138  K 5.2* 4.1 3.5 4.3 3.7  CL 116* 116* 112* 115* 110  CO2 13* 13* 14* 16* 21*  GLUCOSE 113* 105* 87 101* 114*  BUN 33* 24* 19 18 15   CREATININE 1.18* 0.99 0.86 0.74 1.03*  CALCIUM 9.3 9.1 8.8* 8.6* 8.6*  MG  --  2.0  --   --   --     Liver Function Tests: Recent Labs  Lab 10/09/20 1514 10/09/20 1932 10/10/20 0411  AST 54* 30 20  ALT 28 16 16   ALKPHOS 60 55 52  BILITOT <0.1* 1.8* 0.8   PROT 7.6 7.2 6.6  ALBUMIN 4.2 4.0 3.7    CBG: Recent Labs  Lab 10/12/20 0801 10/12/20 1207 10/12/20 1659 10/12/20 2012 10/13/20 0735  GLUCAP 102* 81 113* 127* 91    Urinalysis    Component Value Date/Time   COLORURINE YELLOW 10/09/2020 0010   APPEARANCEUR HAZY (A) 10/09/2020 0010   LABSPEC 1.017 10/09/2020 0010   PHURINE 5.0 10/09/2020 0010   GLUCOSEU NEGATIVE 10/09/2020 0010   HGBUR SMALL (A) 10/09/2020 0010   BILIRUBINUR NEGATIVE 10/09/2020 0010   BILIRUBINUR Negative 06/12/2020 1638   KETONESUR 5 (A) 10/09/2020 0010   PROTEINUR NEGATIVE 10/09/2020 0010   UROBILINOGEN 0.2 06/12/2020 1638   UROBILINOGEN 0.2 09/01/2014 0300   NITRITE NEGATIVE 10/09/2020 0010   LEUKOCYTESUR NEGATIVE 10/09/2020 0010   I discussed in detail with patient's son via phone, updated care and answered all questions.   Time coordinating discharge: 40 minutes  SIGNED:  Vernell Leep, MD, [place], [facility]. [facility]  To contact the attending provider between 7A-7P or the covering provider during after hours 7P-7A, please log into the web site [email or web address] and access using universal [place] password for that web site. If you do not have the password, please call the hospital operator.

## 2020-10-13 NOTE — Discharge Instructions (Signed)

## 2020-10-13 NOTE — Progress Notes (Signed)
AVS given to patient and explained at the bedside. Medications and follow up appointments have been explained with pt verbalizing understanding.  

## 2020-10-13 NOTE — Plan of Care (Signed)
  Problem: Education: Goal: Knowledge of General Education information will improve Description: Including pain rating scale, medication(s)/side effects and non-pharmacologic comfort measures Outcome: Progressing   Problem: Nutrition: Goal: Adequate nutrition will be maintained Outcome: Progressing   

## 2020-10-15 ENCOUNTER — Telehealth: Payer: Medicare Other

## 2020-10-15 ENCOUNTER — Encounter (HOSPITAL_COMMUNITY): Payer: Self-pay | Admitting: Gastroenterology

## 2020-10-16 ENCOUNTER — Telehealth: Payer: Self-pay

## 2020-10-16 DIAGNOSIS — E1122 Type 2 diabetes mellitus with diabetic chronic kidney disease: Secondary | ICD-10-CM | POA: Diagnosis not present

## 2020-10-16 DIAGNOSIS — K529 Noninfective gastroenteritis and colitis, unspecified: Secondary | ICD-10-CM | POA: Diagnosis not present

## 2020-10-16 DIAGNOSIS — I129 Hypertensive chronic kidney disease with stage 1 through stage 4 chronic kidney disease, or unspecified chronic kidney disease: Secondary | ICD-10-CM | POA: Diagnosis not present

## 2020-10-16 DIAGNOSIS — N1831 Chronic kidney disease, stage 3a: Secondary | ICD-10-CM | POA: Diagnosis not present

## 2020-10-16 DIAGNOSIS — M1990 Unspecified osteoarthritis, unspecified site: Secondary | ICD-10-CM | POA: Diagnosis not present

## 2020-10-16 NOTE — Telephone Encounter (Signed)
Transition Care Management Follow-up Telephone Call  Date of discharge and from where: 10/13/2020 [facility]  How have you been since you were released from the hospital? Doing pretty good. Just need to work on her balance  Any questions or concerns? No  Items Reviewed:  Did the pt receive and understand the discharge instructions provided? Yes   Medications obtained and verified? Yes   Other? No   Any new allergies since your discharge? No   Dietary orders reviewed? Yes  Do you have support at home? Yes   Home Care and Equipment/Supplies: Were home health services ordered? yes If so, what is the name of the agency? [facility]  Has the agency set up a time to come to the patient's home? yes Were any new equipment or medical supplies ordered?  No What is the name of the medical supply agency? n/a Were you able to get the supplies/equipment? not applicable Do you have any questions related to the use of the equipment or supplies? No  Functional Questionnaire: (I = Independent and D = Dependent) ADLs: I  Bathing/Dressing- I  Meal Prep- I  Eating- I  Maintaining continence- I  Transferring/Ambulation- I  Managing Meds- I  Follow up appointments reviewed:   PCP Hospital f/u appt confirmed? Yes  Scheduled to see Raman on 10/18/2020 @ 2:30.  Are transportation arrangements needed? No   If their condition worsens, is the pt aware to call PCP or go to the Emergency Dept.? Yes  Was the patient provided with contact information for the PCP's office or ED? Yes  Was to pt encouraged to call back with questions or concerns? Yes

## 2020-10-18 ENCOUNTER — Other Ambulatory Visit: Payer: Self-pay

## 2020-10-18 ENCOUNTER — Ambulatory Visit (INDEPENDENT_AMBULATORY_CARE_PROVIDER_SITE_OTHER): Payer: Medicare Other | Admitting: Nurse Practitioner

## 2020-10-18 VITALS — BP 118/70 | HR 87 | Temp 98.0°F | Ht 63.0 in | Wt 146.2 lb

## 2020-10-18 DIAGNOSIS — G8929 Other chronic pain: Secondary | ICD-10-CM | POA: Diagnosis not present

## 2020-10-18 DIAGNOSIS — E875 Hyperkalemia: Secondary | ICD-10-CM

## 2020-10-18 DIAGNOSIS — E1122 Type 2 diabetes mellitus with diabetic chronic kidney disease: Secondary | ICD-10-CM

## 2020-10-18 DIAGNOSIS — M545 Low back pain, unspecified: Secondary | ICD-10-CM | POA: Diagnosis not present

## 2020-10-18 DIAGNOSIS — I129 Hypertensive chronic kidney disease with stage 1 through stage 4 chronic kidney disease, or unspecified chronic kidney disease: Secondary | ICD-10-CM

## 2020-10-18 DIAGNOSIS — E785 Hyperlipidemia, unspecified: Secondary | ICD-10-CM | POA: Diagnosis not present

## 2020-10-18 DIAGNOSIS — N183 Chronic kidney disease, stage 3 unspecified: Secondary | ICD-10-CM | POA: Diagnosis not present

## 2020-10-18 DIAGNOSIS — K2210 Ulcer of esophagus without bleeding: Secondary | ICD-10-CM

## 2020-10-18 MED ORDER — TRIAMCINOLONE ACETONIDE 40 MG/ML IJ SUSP
40.0000 mg | Freq: Once | INTRAMUSCULAR | Status: AC
  Administered 2020-10-18: 40 mg via INTRAMUSCULAR

## 2020-10-18 NOTE — Progress Notes (Signed)
I,Tianna Badgett,acting as a Education administrator for [employer], NP.,have documented all relevant documentation on the behalf of [employer], NP,as directed by  Bary Castilla, NP while in the presence of Bary Castilla, NP.  This visit occurred during the SARS-CoV-2 public health emergency.  Safety protocols were in place, including screening questions prior to the visit, additional usage of staff PPE, and extensive cleaning of exam room while observing appropriate contact time as indicated for disinfecting solutions.  Subjective:     Patient ID: Anna Hoffman , female    DOB: 12-Jun-1935 , 86 y.o.   MRN: [id number]   Chief Complaint  Patient presents with  . Hospitalization Follow-up    HPI  She was admitted on 10/09/20 for diarrhea and vomiting. And they admitted her for hyperkalemia and dehydration. They also found some cysts and ulcers. She sees kidney specialist. She has already seen a GI specialist and did a endoscopy. She drinks a lot water. She is continue to take the medication they gave her at the hospital. She is not taking a ARB.  She currently takes amlodipine 10 mg daily and carvedilol 3.125 mg twice daily for BP. ARB not taken anymore.  GI sees again 11/23/20. Patient is to follow up with kidney specialist.  Patient was educated to not use aspirin, ibuprofen, naproxen, or other non-steroidal anti-inflammatory drugs. - Follow an antireflux regimen. - Use Protonix (pantoprazole) 40 mg PO BID for 3 months followed by Protonix 26m daily. - Use sucralfate suspension 1 gram PO QID for 4 weeks.    Past Medical History:  Diagnosis Date  . Arthritis   . Diabetes mellitus without complication ([facility]   . Hypertension   . Hypokalemia   . Pneumonia   . Shingles   . Stroke ([facility]      Family History  Problem Relation Age of Onset  . Alzheimer's disease Mother   . Prostate cancer Father   . Brain cancer Brother   . Brain cancer Brother   . Pancreatic cancer Sister   .  Allergic rhinitis Neg Hx   . Asthma Neg Hx   . Eczema Neg Hx   . Urticaria Neg Hx      Current Outpatient Medications:  .  acetaminophen (TYLENOL) 325 MG tablet, Take 2 tablets (650 mg total) by mouth every 6 (six) hours as needed for mild pain (or Fever >/= 101)., Disp: , Rfl:  .  amLODipine (NORVASC) 10 MG tablet, TAKE 1 TABLET BY MOUTH ONCE DAILY (Patient taking differently: Take 10 mg by mouth daily.), Disp: 90 tablet, Rfl: 1 .  Ascorbic Acid (VITAMIN C) 1000 MG tablet, Take 1,000 mg by mouth daily., Disp: , Rfl:  .  aspirin EC 81 MG tablet, Take 81 mg by mouth daily., Disp: , Rfl:  .  azelastine (ASTELIN) 0.1 % nasal spray, Place 2 sprays into both nostrils daily as needed for rhinitis., Disp: , Rfl:  .  Carboxymethylcellul-Glycerin (LUBRICATING EYE DROPS OP), Apply 1 drop to eye daily as needed (dry eyes). , Disp: , Rfl:  .  carvedilol (COREG) 3.125 MG tablet, Take 1 tablet (3.125 mg total) by mouth 2 (two) times daily with a meal., Disp: 60 tablet, Rfl: 0 .  Fexofenadine HCl (ALLEGRA PO), Take 180 mg by mouth at bedtime. , Disp: , Rfl:  .  glucose blood (ONETOUCH VERIO) test strip, 1 each by Other route as needed for other. Use as instructed to check blood sugar 2 times per day dx: e11.65, Disp: 100 each,  Rfl: 11 .  Lancets (ONETOUCH ULTRASOFT) lancets, Use as instructed to check blood sugar 2 times per day dx code e11.65, Disp: 100 each, Rfl: 12 .  pantoprazole (PROTONIX) 40 MG tablet, Take 1 tablet (40 mg total) by mouth 2 (two) times daily before a meal., Disp: 60 tablet, Rfl: 1 .  rosuvastatin (CRESTOR) 20 MG tablet, TAKE 1 TABLET BY MOUTH ONCE DAILY (Patient taking differently: Take 20 mg by mouth daily.), Disp: 90 tablet, Rfl: 1 .  sucralfate (CARAFATE) 1 GM/10ML suspension, Take 10 mLs (1 g total) by mouth 4 (four) times daily -  with meals and at bedtime for 28 days., Disp: 1260 each, Rfl: 0 .  trolamine salicylate (ASPERCREME) 10 % cream, Apply 1 application topically daily as  needed for muscle pain., Disp: , Rfl:  .  vitamin B-12 (CYANOCOBALAMIN) 100 MCG tablet, Take 100 mcg by mouth daily., Disp: , Rfl:  .  benzonatate (TESSALON PERLES) 100 MG capsule, Take 1 capsule (100 mg total) by mouth 3 (three) times daily as needed for cough. (Patient not taking: Reported on 10/18/2020), Disp: 30 capsule, Rfl: 0  Current Facility-Administered Medications:  .  triamcinolone acetonide (KENALOG-40) injection 40 mg, 40 mg, Intramuscular, Once, , , NP   Allergies  Allergen Reactions  . Amoxicillin Diarrhea    Has patient had a PCN reaction causing immediate rash, facial/tongue/throat swelling, SOB or lightheadedness with hypotension: no Has patient had a PCN reaction causing severe rash involving mucus membranes or skin necrosis: no Has patient had a PCN reaction that required hospitalization: no pharmacist consult Has patient had a PCN reaction occurring within the last 10 years: yes If all of the above answers are "NO", then may proceed with Cephalosporin use.   . Ampicillin Rash    - Tolerates Rocephin and Ancef - Remote occurrence; no symptoms of anaphylaxis or severe cutaneous reaction, and no additional medical attention required    . Sulfa Antibiotics Rash     Review of Systems  Constitutional: Negative.  Negative for chills and fatigue.  HENT: Negative for sinus pressure, sinus pain and sore throat.   Respiratory: Negative.  Negative for chest tightness, shortness of breath and wheezing.   Cardiovascular: Negative.  Negative for chest pain and palpitations.  Gastrointestinal: Negative.  Negative for diarrhea, nausea and vomiting.  Endocrine: Negative for polydipsia, polyphagia and polyuria.  Musculoskeletal: Positive for back pain. Negative for gait problem.  Neurological: Negative.      Today's Vitals   10/18/20 1436  BP: 118/70  Pulse: 87  Temp: 98 F (36.7 C)  TempSrc: Oral  Weight: 146 lb 3.2 oz (66.3 kg)  Height: 5' 3" (1.6 m)    Body mass index is 25.9 kg/m.  Wt Readings from Last 3 Encounters:  10/18/20 146 lb 3.2 oz (66.3 kg)  10/13/20 143 lb 14.4 oz (65.3 kg)  08/27/20 140 lb 3.2 oz (63.6 kg)   Objective:  Physical Exam Constitutional:      Appearance: Normal appearance. She is obese.  HENT:     Head: Normocephalic and atraumatic.  Cardiovascular:     Rate and Rhythm: Normal rate and regular rhythm.     Pulses: Normal pulses.     Heart sounds: Normal heart sounds.  Pulmonary:     Effort: Pulmonary effort is normal. No respiratory distress.     Breath sounds: Normal breath sounds. No stridor. No wheezing.  Abdominal:     General: Bowel sounds are normal.  Musculoskeletal:  General: Tenderness present.     Cervical back: Normal range of motion and neck supple.     Comments: Tenderness upon palpitation to lower back   Skin:    General: Skin is warm and dry.     Capillary Refill: Capillary refill takes less than 2 seconds.  Neurological:     Mental Status: She is alert and oriented to person, place, and time.  Psychiatric:        Mood and Affect: Mood normal.        Behavior: Behavior normal.        Thought Content: Thought content normal.        Judgment: Judgment normal.         Assessment And Plan:     1. Hyperkalemia -Will continue to monitor. Will do blood work today. Hold ARB as noted by hospital MD  - CBC - CMP14+EGFR  2. Type 2 diabetes mellitus with stage 3 chronic kidney disease, without long-term current use of insulin, unspecified whether stage 3a or 3b CKD ([place]) -Will continue to monitor  -Educated patient to continue to monitor her BG at home  -Patient will follow up with nephrology  -Will check CMP  - CMP14+EGFR  3. Stage 3 chronic kidney disease, unspecified whether stage 3a or 3b CKD ([place]) -Will continue to monitor  -Patient will see nephrologist  -Will check CMP + EGFR today    4. Chronic bilateral low back pain without sciatica - triamcinolone acetonide  (KENALOG-40) injection 40 mg  5. Hypertensive nephropathy -Irbesartan held due to AKI as per hospital encounter  -Continue amlodipine 10 mg daily and carvedilol 3.125 mg twice daily  -Educated patient to make appt with cardiologist  - CBC - CMP14+EGFR  6. Erosive esophagitis -Followed by GI  -Continue Protonix 40 mg twice daily for x 3 months and then 40 mg once daily.  -Continue sucralfate 1 g 4 times daily for 4 weeks -Follow up with GI 4-6 weeks.    7. Dyslipidemia -Continue Crestor. Will assess today for lipid panel  - Lipid panel   Follow up: As needed or in 3 months   Patient was given opportunity to ask questions. Patient verbalized understanding of the plan and was able to repeat key elements of the plan. All questions were answered to their satisfaction.  Bary Castilla, NP   I, Bary Castilla, NP, have reviewed all documentation for this visit. The documentation on 10/18/20 for the exam, diagnosis, procedures, and orders are all accurate and complete.   IF YOU HAVE BEEN REFERRED TO A SPECIALIST, IT MAY TAKE 1-2 WEEKS TO SCHEDULE/PROCESS THE REFERRAL. IF YOU HAVE NOT HEARD FROM US/SPECIALIST IN TWO WEEKS, PLEASE GIVE [place] A CALL AT [phone].   THE PATIENT IS ENCOURAGED TO PRACTICE SOCIAL DISTANCING DUE TO THE COVID-19 PANDEMIC.

## 2020-10-18 NOTE — Patient Instructions (Addendum)
Hyperkalemia Hyperkalemia occurs when the level of potassium in your blood is too high. Potassium is an important nutrient that helps the muscles and nerves function normally. It affects how the heart works, and it helps keep fluids and minerals balanced in the body. If there is too much potassium in your blood, it can affect your heart's ability to function normally. Potassium is normally removed (excreted) from the body by the kidneys. Hyperkalemia can result from various conditions. It can range from mild to severe. What are the causes? This condition may be caused by:  Taking in too much potassium. You can do this by: ? Using salt substitutes. They contain large amounts of potassium. ? Taking potassium supplements. ? Eating foods that are high in potassium.  Excreting too little potassium. This can happen if: ? Your kidneys are not working properly. Kidney (renal) disease, including short-term or long-term renal failure, is a common cause of hyperkalemia. ? You are taking medicines that lower your excretion of potassium. ? You have Addison's disease. ? You have a urinary tract blockage, such as kidney stones. ? You are on treatment to mechanically clean your blood (dialysis) and you skip a treatment.  Releasing a high amount of potassium from your cells into your blood. This can happen with: ? Injury to muscles (rhabdomyolysis) or other tissues. Most potassium is stored in your muscles. ? Severe burns or infections. ? Acidic blood plasma (acidosis). Acidosis can result from many diseases, such as uncontrolled diabetes. What increases the risk? The following factors may make you more likely to develop this condition:  Kidney disease. This puts you at the highest risk.  Addison's disease. This is a condition where the adrenal glands do not produce enough hormones.  Alcoholism or heavy drug use.  Using certain blood pressure medicines, such as ACE inhibitors, angiotensin II receptor  blockers (ARBs), or potassium-sparing diuretics such as spironolactone.  Severe injury or burn. What are the signs or symptoms? In many cases, there are no symptoms. However, when your potassium level becomes high enough, you may have symptoms such as:  An irregular or very slow heartbeat.  Nausea.  Tiredness (fatigue).  Confusion.  Tingling of your skin or numbness of your hands or feet.  Muscle cramps.  Muscle weakness.  Not being able to move (paralysis). How is this diagnosed? This condition may be diagnosed based on:  Your symptoms and medical history. Your health care provider will ask about your use of prescription and non-prescription drugs.  A physical exam.  Blood tests.  An electrocardiogram (ECG). How is this treated? Treatment depends on the cause and severity of your condition. Treatment may need to be done in the hospital setting. Treatment may include:  IV glucose (sugar) along with insulin to shift potassium out of your blood and into your cells.  A medicine called albuterol to shift potassium out of your blood and into your cells.  Medicines to remove the potassium from your body.  Dialysis to remove the potassium from your body.  Calcium to protect your heart from the effects of high potassium, such as irregular rhythms (arrhythmias). Follow these instructions at home:  Take over-the-counter and prescription medicines only as told by your health care provider.  Do not take any supplements, natural products, herbs, or vitamins without reviewing them with your health care provider. Certain supplements and natural food products contain high amounts of potassium.  Limit your alcohol intake as told by your health care provider.  Do not use drugs.  If you need help quitting, ask your health care provider.  If you have kidney disease, you may need to follow a low-potassium diet. A dietitian can help you learn which foods have high or low amounts of  potassium.  Keep all follow-up visits as told by your health care provider. This is important.   Contact a health care provider if you:  Have an irregular or very slow heartbeat.  Feel light-headed.  Feel weak.  Are nauseous.  Have tingling or numbness in your hands or feet. Get help right away if you:  Have shortness of breath.  Have chest pain or discomfort.  Pass out.  Have muscle paralysis. Summary  Hyperkalemia occurs when the level of potassium in your blood is too high.  This condition may be caused by taking in too much potassium, excreting too little potassium, or releasing a high amount of potassium from your cells into your blood.  Hyperkalemia can result from many underlying conditions, especially chronic kidney disease, or from taking certain medicines.  Treatment of hyperkalemia may include medicine to shift potassium out of your blood and into your cells or to remove the potassium from your body.  If you have kidney disease, you may need to follow a low-potassium diet. A dietitian can help you learn which foods have high or low amounts of potassium. This information is not intended to replace advice given to you by your health care provider. Make sure you discuss any questions you have with your health care provider. Document Revised: 12/22/2019 Document Reviewed: 12/22/2019 Elsevier Patient Education  [place].

## 2020-10-19 ENCOUNTER — Telehealth: Payer: Medicare Other

## 2020-10-19 ENCOUNTER — Other Ambulatory Visit: Payer: Self-pay | Admitting: Nurse Practitioner

## 2020-10-19 ENCOUNTER — Ambulatory Visit (INDEPENDENT_AMBULATORY_CARE_PROVIDER_SITE_OTHER): Payer: Medicare Other

## 2020-10-19 DIAGNOSIS — E1122 Type 2 diabetes mellitus with diabetic chronic kidney disease: Secondary | ICD-10-CM | POA: Diagnosis not present

## 2020-10-19 DIAGNOSIS — I129 Hypertensive chronic kidney disease with stage 1 through stage 4 chronic kidney disease, or unspecified chronic kidney disease: Secondary | ICD-10-CM

## 2020-10-19 DIAGNOSIS — N183 Chronic kidney disease, stage 3 unspecified: Secondary | ICD-10-CM

## 2020-10-19 DIAGNOSIS — R2689 Other abnormalities of gait and mobility: Secondary | ICD-10-CM

## 2020-10-19 DIAGNOSIS — I10 Essential (primary) hypertension: Secondary | ICD-10-CM | POA: Diagnosis not present

## 2020-10-19 DIAGNOSIS — K529 Noninfective gastroenteritis and colitis, unspecified: Secondary | ICD-10-CM | POA: Diagnosis not present

## 2020-10-19 DIAGNOSIS — M1990 Unspecified osteoarthritis, unspecified site: Secondary | ICD-10-CM | POA: Diagnosis not present

## 2020-10-19 DIAGNOSIS — N1831 Chronic kidney disease, stage 3a: Secondary | ICD-10-CM | POA: Diagnosis not present

## 2020-10-19 DIAGNOSIS — I5189 Other ill-defined heart diseases: Secondary | ICD-10-CM

## 2020-10-19 LAB — CMP14+EGFR
ALT: 26 IU/L (ref 0–32)
AST: 23 IU/L (ref 0–40)
Albumin/Globulin Ratio: 1.7 (ref 1.2–2.2)
Albumin: 4.3 g/dL (ref 3.6–4.6)
Alkaline Phosphatase: 70 IU/L (ref 44–121)
BUN/Creatinine Ratio: 19 (ref 12–28)
BUN: 19 mg/dL (ref 8–27)
Bilirubin Total: 0.3 mg/dL (ref 0.0–1.2)
CO2: 17 mmol/L — ABNORMAL LOW (ref 20–29)
Calcium: 9.5 mg/dL (ref 8.7–10.3)
Chloride: 105 mmol/L (ref 96–106)
Creatinine, Ser: 0.98 mg/dL (ref 0.57–1.00)
Globulin, Total: 2.6 g/dL (ref 1.5–4.5)
Glucose: 92 mg/dL (ref 65–99)
Potassium: 4 mmol/L (ref 3.5–5.2)
Sodium: 139 mmol/L (ref 134–144)
Total Protein: 6.9 g/dL (ref 6.0–8.5)
eGFR: 57 mL/min/{1.73_m2} — ABNORMAL LOW (ref >59)

## 2020-10-19 LAB — CBC
Hematocrit: 36.3 % (ref 34.0–46.6)
Hemoglobin: 12 g/dL (ref 11.1–15.9)
MCH: 29.5 pg (ref 26.6–33.0)
MCHC: 33.1 g/dL (ref 31.5–35.7)
MCV: 89 fL (ref 79–97)
Platelets: 426 10*3/uL (ref 150–450)
RBC: 4.07 x10E6/uL (ref 3.77–5.28)
RDW: 14.1 % (ref 11.7–15.4)
WBC: 7.6 10*3/uL (ref 3.4–10.8)

## 2020-10-19 LAB — LIPID PANEL
Chol/HDL Ratio: 2 ratio (ref 0.0–4.4)
Cholesterol, Total: 150 mg/dL (ref 100–199)
HDL: 74 mg/dL (ref >39)
LDL Chol Calc (NIH): 51 mg/dL (ref 0–99)
Triglycerides: 153 mg/dL — ABNORMAL HIGH (ref 0–149)
VLDL Cholesterol Cal: 25 mg/dL (ref 5–40)

## 2020-10-23 DIAGNOSIS — E1122 Type 2 diabetes mellitus with diabetic chronic kidney disease: Secondary | ICD-10-CM | POA: Diagnosis not present

## 2020-10-23 DIAGNOSIS — K529 Noninfective gastroenteritis and colitis, unspecified: Secondary | ICD-10-CM | POA: Diagnosis not present

## 2020-10-23 DIAGNOSIS — I129 Hypertensive chronic kidney disease with stage 1 through stage 4 chronic kidney disease, or unspecified chronic kidney disease: Secondary | ICD-10-CM | POA: Diagnosis not present

## 2020-10-23 DIAGNOSIS — M1990 Unspecified osteoarthritis, unspecified site: Secondary | ICD-10-CM | POA: Diagnosis not present

## 2020-10-23 DIAGNOSIS — N1831 Chronic kidney disease, stage 3a: Secondary | ICD-10-CM | POA: Diagnosis not present

## 2020-10-23 NOTE — Chronic Care Management (AMB) (Signed)
Chronic Care Management   CCM RN Visit Note  10/19/2020 Name: Anna Hoffman MRN: [id number] DOB: 11-23-1934  Subjective: Anna Hoffman is a 86 y.o. year old female who is a primary care patient of Glendale Chard, MD. The care management team was consulted for assistance with disease management and care coordination needs.    Engaged with patient by telephone for follow up visit in response to provider referral for case management and/or care coordination services.   Consent to Services:  The patient was given information about Chronic Care Management services, agreed to services, and gave verbal consent prior to initiation of services.  Please see initial visit note for detailed documentation.   Patient agreed to services and verbal consent obtained.   Assessment: Review of patient past medical history, allergies, medications, health status, including review of consultants reports, laboratory and other test data, was performed as part of comprehensive evaluation and provision of chronic care management services.   SDOH (Social Determinants of Health) assessments and interventions performed:  Yes, no acute challenges   CCM Care Plan  Allergies  Allergen Reactions  . Amoxicillin Diarrhea    Has patient had a PCN reaction causing immediate rash, facial/tongue/throat swelling, SOB or lightheadedness with hypotension: no Has patient had a PCN reaction causing severe rash involving mucus membranes or skin necrosis: no Has patient had a PCN reaction that required hospitalization: no pharmacist consult Has patient had a PCN reaction occurring within the last 10 years: yes If all of the above answers are "NO", then may proceed with Cephalosporin use.   . Ampicillin Rash    - Tolerates Rocephin and Ancef - Remote occurrence; no symptoms of anaphylaxis or severe cutaneous reaction, and no additional medical attention required    . Sulfa Antibiotics Rash    Outpatient Encounter  Medications as of 10/19/2020  Medication Sig  . acetaminophen (TYLENOL) 325 MG tablet Take 2 tablets (650 mg total) by mouth every 6 (six) hours as needed for mild pain (or Fever >/= 101).  Marland Kitchen amLODipine (NORVASC) 10 MG tablet TAKE 1 TABLET BY MOUTH ONCE DAILY (Patient taking differently: Take 10 mg by mouth daily.)  . Ascorbic Acid (VITAMIN C) 1000 MG tablet Take 1,000 mg by mouth daily.  Marland Kitchen aspirin EC 81 MG tablet Take 81 mg by mouth daily.  Marland Kitchen azelastine (ASTELIN) 0.1 % nasal spray Place 2 sprays into both nostrils daily as needed for rhinitis.  Marland Kitchen benzonatate (TESSALON PERLES) 100 MG capsule Take 1 capsule (100 mg total) by mouth 3 (three) times daily as needed for cough. (Patient not taking: Reported on 10/18/2020)  . Carboxymethylcellul-Glycerin (LUBRICATING EYE DROPS OP) Apply 1 drop to eye daily as needed (dry eyes).   . carvedilol (COREG) 3.125 MG tablet Take 1 tablet (3.125 mg total) by mouth 2 (two) times daily with a meal.  . Fexofenadine HCl (ALLEGRA PO) Take 180 mg by mouth at bedtime.   Marland Kitchen glucose blood (ONETOUCH VERIO) test strip 1 each by Other route as needed for other. Use as instructed to check blood sugar 2 times per day dx: e11.65  . Lancets (ONETOUCH ULTRASOFT) lancets Use as instructed to check blood sugar 2 times per day dx code e11.65  . pantoprazole (PROTONIX) 40 MG tablet Take 1 tablet (40 mg total) by mouth 2 (two) times daily before a meal.  . rosuvastatin (CRESTOR) 20 MG tablet TAKE 1 TABLET BY MOUTH ONCE DAILY (Patient taking differently: Take 20 mg by mouth daily.)  . sucralfate (CARAFATE)  1 GM/10ML suspension Take 10 mLs (1 g total) by mouth 4 (four) times daily -  with meals and at bedtime for 28 days.  Marland Kitchen trolamine salicylate (ASPERCREME) 10 % cream Apply 1 application topically daily as needed for muscle pain.  . vitamin B-12 (CYANOCOBALAMIN) 100 MCG tablet Take 100 mcg by mouth daily.   No facility-administered encounter medications on file as of 10/19/2020.     Patient Active Problem List   Diagnosis Date Noted  . Erosive esophagitis   . Hiatal hernia   . Hyponatremia 10/10/2020  . Acute kidney injury superimposed on chronic kidney disease ([place]) 10/10/2020  . Aortic atherosclerosis ([place]) 10/09/2020  . Acute renal failure superimposed on stage 3a chronic kidney disease ([place]) 10/09/2020  . Hyperkalemia 10/09/2020  . Hearing loss 10/09/2020  . Prolonged QT interval 10/09/2020  . Colitis, acute 08/11/2019  . Colitis 07/11/2019  . Loose stools 06/07/2019  . Herpes zoster without complication 92/42/6834  . Other long term (current) drug therapy 08/12/2018  . Chronic renal disease, stage III ([place]) 07/19/2018  . Hypertensive nephropathy 07/19/2018  . Community acquired pneumonia of right upper lobe of lung 11/07/2017  . Hypertensive emergency 07/01/2016  . Thalamic hemorrhage ([place]) 07/01/2016  . 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 Benign essential HTN   . Type 2 diabetes mellitus with stage 3 chronic kidney disease, without long-term current use of insulin ([place])   . Mixed hyperlipidemia   . ICH (intracerebral hemorrhage) ([place]) - hypertensive R thalamic hemorrhage  06/27/2016  . 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 Retroperitoneal bleed 02/08/2016  . Generalized abdominal pain   . Gastroenteritis 02/04/2016  . Diarrhea 02/04/2016  . Hypokalemia 02/04/2016  . Incarcerated paraesophageal hernia 09/02/2014  . Renal mass, left 09/02/2014  . Acute esophagitis 09/02/2014  . Gastric outlet obstruction 09/02/2014  . Hypertension   . Vomiting 09/01/2014    Conditions to be addressed/monitored:Type 2 DM with Stage III CKD, HTN, Hypertensive Nephropathy, Diastolic Dysfunction  Care Plan : Chronic Kidney (Adult)  Updates made by Lynne Logan, RN since 10/23/2020 12:00 AM    Problem: Disease Progression   Priority: High    Long-Range Goal: Disease Progression Prevented or Minimized   Start Date: 10/19/2020  Expected End Date:  04/19/2021  This Visit's Progress: On track  Priority: High  Note:   Current Barriers:   Ineffective Self Health Maintenance Clinical Goal(s):  Marland Kitchen Collaboration with Glendale Chard, MD regarding development and update of comprehensive plan of care as evidenced by provider attestation and co-signature . Inter-disciplinary care team collaboration (see longitudinal plan of care)  patient will work with care management team to address care coordination and chronic disease management needs related to Disease Management  Educational Needs  Care Coordination  Medication Management and Education  Psychosocial Support   Interventions:  10/19/20 successful outbound call completed with patient   Evaluation of current treatment plan related to CKD Stage III , self-management and patient's adherence to plan as established by provider.  Collaboration with Glendale Chard, MD regarding development and update of comprehensive plan of care as evidenced by provider attestation       and co-signature  Inter-disciplinary care team collaboration (see longitudinal plan of care) . Provided education to patient about basic CKD disease process . Review of patient status, including review of consultants reports, relevant laboratory and other test results, and medications completed. . Reviewed medications with patient and discussed importance of medication adherence . Increase water intake to 64 oz  daily unless otherwise directed  . Reviewed scheduled/upcoming provider appointments including: PCP follow up scheduled for 01/24/21 '@4' :00 PM   Discussed plans with patient for ongoing care management follow up and provided patient with direct contact information for care management team Self Care Activities:  . Increase water intake to 64 oz daily . Keep all scheduled MD follow up appointments  . Continue to take prescribed medications as directed . Let your CCM team and or PCP know if you are unable to take your  medications as prescribed . Contact pharmacy for refills at least 7 days before running out of medication . Contact PCP for questions or concerns  Patient Goals: - to maintain and or improve kidney function  Follow Up Plan: Telephone follow up appointment with care management team member scheduled for: 01/28/21    Care Plan : Diabetes Type 2 (Adult)  Updates made by Lynne Logan, RN since 10/23/2020 12:00 AM    Problem: Glycemic Management (Diabetes, Type 2)   Priority: Medium    Long-Range Goal: Glycemic Management Optimized   Start Date: 10/19/2020  Expected End Date: 04/19/2021  This Visit's Progress: On track  Priority: Medium  Note:   Objective:  Lab Results  Component Value Date   HGBA1C 6.7 (H) 10/10/2020 .   Lab Results  Component Value Date   CREATININE 0.98 10/18/2020   CREATININE 1.03 (H) 10/13/2020   CREATININE 0.74 10/12/2020 .   Marland Kitchen No results found for: EGFR Current Barriers:  Marland Kitchen Knowledge Deficits related to basic Diabetes pathophysiology and self care/management . Knowledge Deficits related to medications used for management of diabetes Case Manager Clinical Goal(s):  . patient will demonstrate improved adherence to prescribed treatment plan for diabetes self care/management as evidenced by: daily monitoring and recording of CBG  adherence to ADA/ carb modified diet adherence to prescribed medication regimen contacting provider for new or worsened symptoms or questions Interventions:  . Collaboration with Glendale Chard, MD regarding development and update of comprehensive plan of care as evidenced by provider attestation and co-signature . Inter-disciplinary care team collaboration (see longitudinal plan of care) . Provided education to patient about basic DM disease process . Review of patient status, including review of consultants reports, relevant laboratory and other test results, and medications completed. . Reviewed medications with patient and  discussed importance of medication adherence . Provided patient with written educational materials related to hypo and hyperglycemia and importance of correct treatment . Reviewed scheduled/upcoming provider appointments including: PCP follow up scheduled for 01/24/21 '@4' :00 pm  . Advised patient, providing education and rationale, to check cbg daily before meals and at bedtime and record, calling the CCM team and or PCP for findings outside established parameters.   . Discussed plans with patient for ongoing care management follow up and provided patient with direct contact information for care management team Self-Care Activities Self administers oral medications as prescribed Attends all scheduled provider appointments Checks blood sugars as prescribed and utilize hyper and hypoglycemia protocol as needed Adheres to prescribed ADA/carb modified Patient Goals: - check blood sugar at prescribed times - check blood sugar before and after exercise - check blood sugar if I feel it is too high or too low - take the blood sugar log to all doctor visits - take the blood sugar meter to all doctor visits  Follow Up Plan: Telephone follow up appointment with care management team member scheduled for: 01/28/21    Plan:Telephone follow up appointment with care management team member scheduled for:  01/28/21  Barb Merino, RN, BSN, CCM Care Management Coordinator [facility] [facility]  Direct Phone: [phone]

## 2020-10-23 NOTE — Patient Instructions (Signed)
Goals Addressed    Other   .  Follow My Treatment Plan-Chronic Kidney   On track     Timeframe:  Long-Range Goal Priority:  High Start Date:  10/19/20                           Expected End Date: 04/21/21                      Follow Up Date: 01/28/21    Patient Self-Care Activities: . Increase water intake to 64 oz daily . Keep all scheduled MD follow up appointments  . Continue to take prescribed medications as directed . Let your CCM team and or PCP know if you are unable to take your medications as prescribed . Contact pharmacy for refills at least 7 days before running out of medication . Contact PCP for questions or concerns    Why is this important?    Staying as healthy as you can is very important. This may mean making changes if you smoke, don't exercise or eat poorly.   A healthy lifestyle is an important goal for you.   Following the treatment plan and making changes may be hard.   Try some of these steps to help keep the disease from getting worse.     Notes:     Marland Kitchen  Monitor and Manage My Blood Sugar-Diabetes Type 2   On track     Timeframe:  Long-Range Goal Priority:  Medium Start Date: 10/19/20                            Expected End Date: 04/21/21                      Follow Up Date: 01/28/21   - check blood sugar at prescribed times - check blood sugar before and after exercise - check blood sugar if I feel it is too high or too low - take the blood sugar log to all doctor visits - take the blood sugar meter to all doctor visits    Why is this important?    Checking your blood sugar at home helps to keep it from getting very high or very low.   Writing the results in a diary or log helps the doctor know how to care for you.   Your blood sugar log should have the time, date and the results.   Also, write down the amount of insulin or other medicine that you take.   Other information, like what you ate, exercise done and how you were feeling, will also be  helpful.     Notes:     .  Obtain Eye Exam-Diabetes Type 2        Timeframe:  Long-Range Goal Priority:  Medium Start Date: 10/19/20                            Expected End Date: 04/21/21                        Follow Up Date: 01/28/21   - keep appointment with eye doctor - schedule appointment with eye doctor    Why is this important?    Eye check-ups are important when you have diabetes.   Vision loss can be prevented.  Notes:     .  Perform Foot Care-Diabetes Type 2        Timeframe:  Long-Range Goal Priority:  Medium Start Date:  10/19/20                           Expected End Date:  04/21/21                    Follow Up Date: 01/28/21   - check feet daily for cuts, sores or redness - keep feet up while sitting - trim toenails straight across - wash and dry feet carefully every day - wear comfortable, cotton socks - wear comfortable, well-fitting shoes    Why is this important?    Good foot care is very important when you have diabetes.   There are many things you can do to keep your feet healthy and catch a problem early.    Notes:

## 2020-10-24 DIAGNOSIS — Z8673 Personal history of transient ischemic attack (TIA), and cerebral infarction without residual deficits: Secondary | ICD-10-CM | POA: Diagnosis not present

## 2020-10-24 DIAGNOSIS — I129 Hypertensive chronic kidney disease with stage 1 through stage 4 chronic kidney disease, or unspecified chronic kidney disease: Secondary | ICD-10-CM | POA: Diagnosis not present

## 2020-10-24 DIAGNOSIS — K529 Noninfective gastroenteritis and colitis, unspecified: Secondary | ICD-10-CM | POA: Diagnosis not present

## 2020-10-24 DIAGNOSIS — E871 Hypo-osmolality and hyponatremia: Secondary | ICD-10-CM | POA: Diagnosis not present

## 2020-10-24 DIAGNOSIS — E1122 Type 2 diabetes mellitus with diabetic chronic kidney disease: Secondary | ICD-10-CM | POA: Diagnosis not present

## 2020-10-24 DIAGNOSIS — E875 Hyperkalemia: Secondary | ICD-10-CM | POA: Diagnosis not present

## 2020-10-24 DIAGNOSIS — H9190 Unspecified hearing loss, unspecified ear: Secondary | ICD-10-CM | POA: Diagnosis not present

## 2020-10-24 DIAGNOSIS — K440 Diaphragmatic hernia with obstruction, without gangrene: Secondary | ICD-10-CM | POA: Diagnosis not present

## 2020-10-24 DIAGNOSIS — M545 Low back pain, unspecified: Secondary | ICD-10-CM | POA: Diagnosis not present

## 2020-10-24 DIAGNOSIS — N179 Acute kidney failure, unspecified: Secondary | ICD-10-CM

## 2020-10-24 DIAGNOSIS — K209 Esophagitis, unspecified without bleeding: Secondary | ICD-10-CM | POA: Diagnosis not present

## 2020-10-24 DIAGNOSIS — E782 Mixed hyperlipidemia: Secondary | ICD-10-CM | POA: Diagnosis not present

## 2020-10-24 DIAGNOSIS — K2210 Ulcer of esophagus without bleeding: Secondary | ICD-10-CM | POA: Diagnosis not present

## 2020-10-24 DIAGNOSIS — M1990 Unspecified osteoarthritis, unspecified site: Secondary | ICD-10-CM | POA: Diagnosis not present

## 2020-10-24 DIAGNOSIS — I700 Atherosclerosis of aorta: Secondary | ICD-10-CM | POA: Diagnosis not present

## 2020-10-24 DIAGNOSIS — N2889 Other specified disorders of kidney and ureter: Secondary | ICD-10-CM | POA: Diagnosis not present

## 2020-10-24 DIAGNOSIS — D1771 Benign lipomatous neoplasm of kidney: Secondary | ICD-10-CM | POA: Diagnosis not present

## 2020-10-24 DIAGNOSIS — K259 Gastric ulcer, unspecified as acute or chronic, without hemorrhage or perforation: Secondary | ICD-10-CM | POA: Diagnosis not present

## 2020-10-24 DIAGNOSIS — G8929 Other chronic pain: Secondary | ICD-10-CM | POA: Diagnosis not present

## 2020-10-24 DIAGNOSIS — Z9181 History of falling: Secondary | ICD-10-CM | POA: Diagnosis not present

## 2020-10-24 DIAGNOSIS — N1831 Chronic kidney disease, stage 3a: Secondary | ICD-10-CM | POA: Diagnosis not present

## 2020-10-29 DIAGNOSIS — E1122 Type 2 diabetes mellitus with diabetic chronic kidney disease: Secondary | ICD-10-CM | POA: Diagnosis not present

## 2020-10-29 DIAGNOSIS — M545 Low back pain, unspecified: Secondary | ICD-10-CM | POA: Diagnosis not present

## 2020-10-29 DIAGNOSIS — G8929 Other chronic pain: Secondary | ICD-10-CM | POA: Diagnosis not present

## 2020-10-29 DIAGNOSIS — I700 Atherosclerosis of aorta: Secondary | ICD-10-CM | POA: Diagnosis not present

## 2020-10-29 DIAGNOSIS — K259 Gastric ulcer, unspecified as acute or chronic, without hemorrhage or perforation: Secondary | ICD-10-CM | POA: Diagnosis not present

## 2020-10-29 DIAGNOSIS — D1771 Benign lipomatous neoplasm of kidney: Secondary | ICD-10-CM | POA: Diagnosis not present

## 2020-10-29 DIAGNOSIS — K2210 Ulcer of esophagus without bleeding: Secondary | ICD-10-CM | POA: Diagnosis not present

## 2020-10-29 DIAGNOSIS — H9190 Unspecified hearing loss, unspecified ear: Secondary | ICD-10-CM | POA: Diagnosis not present

## 2020-10-29 DIAGNOSIS — N1831 Chronic kidney disease, stage 3a: Secondary | ICD-10-CM | POA: Diagnosis not present

## 2020-10-29 DIAGNOSIS — Z9181 History of falling: Secondary | ICD-10-CM | POA: Diagnosis not present

## 2020-10-29 DIAGNOSIS — M1990 Unspecified osteoarthritis, unspecified site: Secondary | ICD-10-CM | POA: Diagnosis not present

## 2020-10-29 DIAGNOSIS — E871 Hypo-osmolality and hyponatremia: Secondary | ICD-10-CM | POA: Diagnosis not present

## 2020-10-29 DIAGNOSIS — K440 Diaphragmatic hernia with obstruction, without gangrene: Secondary | ICD-10-CM | POA: Diagnosis not present

## 2020-10-29 DIAGNOSIS — N179 Acute kidney failure, unspecified: Secondary | ICD-10-CM | POA: Diagnosis not present

## 2020-10-29 DIAGNOSIS — N2889 Other specified disorders of kidney and ureter: Secondary | ICD-10-CM | POA: Diagnosis not present

## 2020-10-29 DIAGNOSIS — E875 Hyperkalemia: Secondary | ICD-10-CM | POA: Diagnosis not present

## 2020-10-29 DIAGNOSIS — Z8673 Personal history of transient ischemic attack (TIA), and cerebral infarction without residual deficits: Secondary | ICD-10-CM | POA: Diagnosis not present

## 2020-10-29 DIAGNOSIS — K209 Esophagitis, unspecified without bleeding: Secondary | ICD-10-CM | POA: Diagnosis not present

## 2020-10-29 DIAGNOSIS — E782 Mixed hyperlipidemia: Secondary | ICD-10-CM | POA: Diagnosis not present

## 2020-10-29 DIAGNOSIS — K529 Noninfective gastroenteritis and colitis, unspecified: Secondary | ICD-10-CM | POA: Diagnosis not present

## 2020-10-29 DIAGNOSIS — I129 Hypertensive chronic kidney disease with stage 1 through stage 4 chronic kidney disease, or unspecified chronic kidney disease: Secondary | ICD-10-CM | POA: Diagnosis not present

## 2020-10-31 DIAGNOSIS — K2210 Ulcer of esophagus without bleeding: Secondary | ICD-10-CM | POA: Diagnosis not present

## 2020-10-31 DIAGNOSIS — E1122 Type 2 diabetes mellitus with diabetic chronic kidney disease: Secondary | ICD-10-CM | POA: Diagnosis not present

## 2020-10-31 DIAGNOSIS — Z9181 History of falling: Secondary | ICD-10-CM | POA: Diagnosis not present

## 2020-10-31 DIAGNOSIS — K440 Diaphragmatic hernia with obstruction, without gangrene: Secondary | ICD-10-CM | POA: Diagnosis not present

## 2020-10-31 DIAGNOSIS — N179 Acute kidney failure, unspecified: Secondary | ICD-10-CM | POA: Diagnosis not present

## 2020-10-31 DIAGNOSIS — M1990 Unspecified osteoarthritis, unspecified site: Secondary | ICD-10-CM | POA: Diagnosis not present

## 2020-10-31 DIAGNOSIS — I129 Hypertensive chronic kidney disease with stage 1 through stage 4 chronic kidney disease, or unspecified chronic kidney disease: Secondary | ICD-10-CM | POA: Diagnosis not present

## 2020-10-31 DIAGNOSIS — N1831 Chronic kidney disease, stage 3a: Secondary | ICD-10-CM | POA: Diagnosis not present

## 2020-10-31 DIAGNOSIS — G8929 Other chronic pain: Secondary | ICD-10-CM | POA: Diagnosis not present

## 2020-10-31 DIAGNOSIS — H9190 Unspecified hearing loss, unspecified ear: Secondary | ICD-10-CM | POA: Diagnosis not present

## 2020-10-31 DIAGNOSIS — M545 Low back pain, unspecified: Secondary | ICD-10-CM | POA: Diagnosis not present

## 2020-10-31 DIAGNOSIS — E782 Mixed hyperlipidemia: Secondary | ICD-10-CM | POA: Diagnosis not present

## 2020-10-31 DIAGNOSIS — K209 Esophagitis, unspecified without bleeding: Secondary | ICD-10-CM | POA: Diagnosis not present

## 2020-10-31 DIAGNOSIS — K529 Noninfective gastroenteritis and colitis, unspecified: Secondary | ICD-10-CM | POA: Diagnosis not present

## 2020-10-31 DIAGNOSIS — E875 Hyperkalemia: Secondary | ICD-10-CM | POA: Diagnosis not present

## 2020-10-31 DIAGNOSIS — E871 Hypo-osmolality and hyponatremia: Secondary | ICD-10-CM | POA: Diagnosis not present

## 2020-10-31 DIAGNOSIS — Z8673 Personal history of transient ischemic attack (TIA), and cerebral infarction without residual deficits: Secondary | ICD-10-CM | POA: Diagnosis not present

## 2020-10-31 DIAGNOSIS — N2889 Other specified disorders of kidney and ureter: Secondary | ICD-10-CM | POA: Diagnosis not present

## 2020-10-31 DIAGNOSIS — K259 Gastric ulcer, unspecified as acute or chronic, without hemorrhage or perforation: Secondary | ICD-10-CM | POA: Diagnosis not present

## 2020-10-31 DIAGNOSIS — D1771 Benign lipomatous neoplasm of kidney: Secondary | ICD-10-CM | POA: Diagnosis not present

## 2020-10-31 DIAGNOSIS — I700 Atherosclerosis of aorta: Secondary | ICD-10-CM | POA: Diagnosis not present

## 2020-11-05 DIAGNOSIS — D1771 Benign lipomatous neoplasm of kidney: Secondary | ICD-10-CM | POA: Diagnosis not present

## 2020-11-05 DIAGNOSIS — N1831 Chronic kidney disease, stage 3a: Secondary | ICD-10-CM | POA: Diagnosis not present

## 2020-11-05 DIAGNOSIS — M545 Low back pain, unspecified: Secondary | ICD-10-CM | POA: Diagnosis not present

## 2020-11-05 DIAGNOSIS — E875 Hyperkalemia: Secondary | ICD-10-CM | POA: Diagnosis not present

## 2020-11-05 DIAGNOSIS — N2889 Other specified disorders of kidney and ureter: Secondary | ICD-10-CM | POA: Diagnosis not present

## 2020-11-05 DIAGNOSIS — K259 Gastric ulcer, unspecified as acute or chronic, without hemorrhage or perforation: Secondary | ICD-10-CM | POA: Diagnosis not present

## 2020-11-05 DIAGNOSIS — N179 Acute kidney failure, unspecified: Secondary | ICD-10-CM | POA: Diagnosis not present

## 2020-11-05 DIAGNOSIS — K209 Esophagitis, unspecified without bleeding: Secondary | ICD-10-CM | POA: Diagnosis not present

## 2020-11-05 DIAGNOSIS — Z8673 Personal history of transient ischemic attack (TIA), and cerebral infarction without residual deficits: Secondary | ICD-10-CM | POA: Diagnosis not present

## 2020-11-05 DIAGNOSIS — H9190 Unspecified hearing loss, unspecified ear: Secondary | ICD-10-CM | POA: Diagnosis not present

## 2020-11-05 DIAGNOSIS — G8929 Other chronic pain: Secondary | ICD-10-CM | POA: Diagnosis not present

## 2020-11-05 DIAGNOSIS — M1990 Unspecified osteoarthritis, unspecified site: Secondary | ICD-10-CM | POA: Diagnosis not present

## 2020-11-05 DIAGNOSIS — K529 Noninfective gastroenteritis and colitis, unspecified: Secondary | ICD-10-CM | POA: Diagnosis not present

## 2020-11-05 DIAGNOSIS — E871 Hypo-osmolality and hyponatremia: Secondary | ICD-10-CM | POA: Diagnosis not present

## 2020-11-05 DIAGNOSIS — E782 Mixed hyperlipidemia: Secondary | ICD-10-CM | POA: Diagnosis not present

## 2020-11-05 DIAGNOSIS — E1122 Type 2 diabetes mellitus with diabetic chronic kidney disease: Secondary | ICD-10-CM | POA: Diagnosis not present

## 2020-11-05 DIAGNOSIS — I700 Atherosclerosis of aorta: Secondary | ICD-10-CM | POA: Diagnosis not present

## 2020-11-05 DIAGNOSIS — Z9181 History of falling: Secondary | ICD-10-CM | POA: Diagnosis not present

## 2020-11-05 DIAGNOSIS — K2210 Ulcer of esophagus without bleeding: Secondary | ICD-10-CM | POA: Diagnosis not present

## 2020-11-05 DIAGNOSIS — K440 Diaphragmatic hernia with obstruction, without gangrene: Secondary | ICD-10-CM | POA: Diagnosis not present

## 2020-11-05 DIAGNOSIS — I129 Hypertensive chronic kidney disease with stage 1 through stage 4 chronic kidney disease, or unspecified chronic kidney disease: Secondary | ICD-10-CM | POA: Diagnosis not present

## 2020-11-06 ENCOUNTER — Other Ambulatory Visit (HOSPITAL_COMMUNITY): Payer: Self-pay

## 2020-11-06 DIAGNOSIS — M25561 Pain in right knee: Secondary | ICD-10-CM | POA: Diagnosis not present

## 2020-11-06 DIAGNOSIS — M25562 Pain in left knee: Secondary | ICD-10-CM | POA: Diagnosis not present

## 2020-11-07 DIAGNOSIS — H9190 Unspecified hearing loss, unspecified ear: Secondary | ICD-10-CM | POA: Diagnosis not present

## 2020-11-07 DIAGNOSIS — K209 Esophagitis, unspecified without bleeding: Secondary | ICD-10-CM | POA: Diagnosis not present

## 2020-11-07 DIAGNOSIS — K259 Gastric ulcer, unspecified as acute or chronic, without hemorrhage or perforation: Secondary | ICD-10-CM | POA: Diagnosis not present

## 2020-11-07 DIAGNOSIS — K440 Diaphragmatic hernia with obstruction, without gangrene: Secondary | ICD-10-CM | POA: Diagnosis not present

## 2020-11-07 DIAGNOSIS — K529 Noninfective gastroenteritis and colitis, unspecified: Secondary | ICD-10-CM | POA: Diagnosis not present

## 2020-11-07 DIAGNOSIS — I129 Hypertensive chronic kidney disease with stage 1 through stage 4 chronic kidney disease, or unspecified chronic kidney disease: Secondary | ICD-10-CM | POA: Diagnosis not present

## 2020-11-07 DIAGNOSIS — M545 Low back pain, unspecified: Secondary | ICD-10-CM | POA: Diagnosis not present

## 2020-11-07 DIAGNOSIS — M1990 Unspecified osteoarthritis, unspecified site: Secondary | ICD-10-CM | POA: Diagnosis not present

## 2020-11-07 DIAGNOSIS — D1771 Benign lipomatous neoplasm of kidney: Secondary | ICD-10-CM | POA: Diagnosis not present

## 2020-11-07 DIAGNOSIS — E875 Hyperkalemia: Secondary | ICD-10-CM | POA: Diagnosis not present

## 2020-11-07 DIAGNOSIS — N179 Acute kidney failure, unspecified: Secondary | ICD-10-CM | POA: Diagnosis not present

## 2020-11-07 DIAGNOSIS — Z8673 Personal history of transient ischemic attack (TIA), and cerebral infarction without residual deficits: Secondary | ICD-10-CM | POA: Diagnosis not present

## 2020-11-07 DIAGNOSIS — E1122 Type 2 diabetes mellitus with diabetic chronic kidney disease: Secondary | ICD-10-CM | POA: Diagnosis not present

## 2020-11-07 DIAGNOSIS — E782 Mixed hyperlipidemia: Secondary | ICD-10-CM | POA: Diagnosis not present

## 2020-11-07 DIAGNOSIS — N2889 Other specified disorders of kidney and ureter: Secondary | ICD-10-CM | POA: Diagnosis not present

## 2020-11-07 DIAGNOSIS — Z9181 History of falling: Secondary | ICD-10-CM | POA: Diagnosis not present

## 2020-11-07 DIAGNOSIS — K2210 Ulcer of esophagus without bleeding: Secondary | ICD-10-CM | POA: Diagnosis not present

## 2020-11-07 DIAGNOSIS — G8929 Other chronic pain: Secondary | ICD-10-CM | POA: Diagnosis not present

## 2020-11-07 DIAGNOSIS — E871 Hypo-osmolality and hyponatremia: Secondary | ICD-10-CM | POA: Diagnosis not present

## 2020-11-07 DIAGNOSIS — I700 Atherosclerosis of aorta: Secondary | ICD-10-CM | POA: Diagnosis not present

## 2020-11-07 DIAGNOSIS — N1831 Chronic kidney disease, stage 3a: Secondary | ICD-10-CM | POA: Diagnosis not present

## 2020-11-08 ENCOUNTER — Ambulatory Visit (INDEPENDENT_AMBULATORY_CARE_PROVIDER_SITE_OTHER): Payer: Medicare Other

## 2020-11-08 ENCOUNTER — Other Ambulatory Visit: Payer: Self-pay

## 2020-11-08 ENCOUNTER — Other Ambulatory Visit (HOSPITAL_COMMUNITY): Payer: Self-pay

## 2020-11-08 DIAGNOSIS — I10 Essential (primary) hypertension: Secondary | ICD-10-CM | POA: Diagnosis not present

## 2020-11-08 DIAGNOSIS — E1122 Type 2 diabetes mellitus with diabetic chronic kidney disease: Secondary | ICD-10-CM | POA: Diagnosis not present

## 2020-11-08 DIAGNOSIS — N183 Chronic kidney disease, stage 3 unspecified: Secondary | ICD-10-CM | POA: Diagnosis not present

## 2020-11-08 MED ORDER — ONETOUCH VERIO VI STRP
1.0000 | ORAL_STRIP | 11 refills | Status: DC | PRN
  Filled 2020-11-08: qty 100, 50d supply, fill #0

## 2020-11-08 NOTE — Patient Instructions (Signed)
Social Worker Visit Information  Goals we discussed today:  Goals Addressed            This Visit's Progress   . Work with SW to manage care coordination needs       Timeframe:  Long-Range Goal Priority:  Low Start Date:    1.7.22                         Expected End Date:  5.7.22                     Next planned outreach date: 5.10.22   Patient Goals/Self-Care Activities Over the next 60 days, patient will:   - Patient will self administer medications as prescribed Patient will attend all scheduled provider appointments Contact SW as needed prior to next scheduled call       Follow Up Plan: SW will follow up with patient by phone over the next month   Daneen Schick, BSW, CDP Social Worker, Certified Dementia Practitioner [place] [facility] [phone]

## 2020-11-08 NOTE — Chronic Care Management (AMB) (Signed)
Chronic Care Management    Social Work Note  11/08/2020 Name: Anna Hoffman MRN: [id number] DOB: Aug 27, 1934  Anna Hoffman is a 86 y.o. year old female who is a primary care patient of Anna Chard, MD. The CCM team was consulted to assist the patient with chronic disease management and/or care coordination needs related to: [employer] .   Engaged with patient by telephone for follow up visit in response to provider referral for social work chronic care management and care coordination services.   Consent to Services:  The patient was given information about Chronic Care Management services, agreed to services, and gave verbal consent prior to initiation of services.  Please see initial visit note for detailed documentation.   Patient agreed to services and consent obtained.   Assessment: Review of patient past medical history, allergies, medications, and health status, including review of relevant consultants reports was performed today as part of a comprehensive evaluation and provision of chronic care management and care coordination services.     SDOH (Social Determinants of Health) assessments and interventions performed:    Advanced Directives Status: Not addressed in this encounter.  CCM Care Plan  Allergies  Allergen Reactions  . Amoxicillin Diarrhea    Has patient had a PCN reaction causing immediate rash, facial/tongue/throat swelling, SOB or lightheadedness with hypotension: no Has patient had a PCN reaction causing severe rash involving mucus membranes or skin necrosis: no Has patient had a PCN reaction that required hospitalization: no pharmacist consult Has patient had a PCN reaction occurring within the last 10 years: yes If all of the above answers are "NO", then may proceed with Cephalosporin use.   . Ampicillin Rash    - Tolerates Rocephin and Ancef - Remote occurrence; no symptoms of anaphylaxis or severe cutaneous reaction, and no additional medical  attention required    . Sulfa Antibiotics Rash    Outpatient Encounter Medications as of 11/08/2020  Medication Sig  . acetaminophen (TYLENOL) 325 MG tablet Take 2 tablets (650 mg total) by mouth every 6 (six) hours as needed for mild pain (or Fever >/= 101).  Marland Kitchen amLODipine (NORVASC) 10 MG tablet TAKE 1 TABLET BY MOUTH ONCE DAILY (Patient taking differently: Take 10 mg by mouth daily.)  . Ascorbic Acid (VITAMIN C) 1000 MG tablet Take 1,000 mg by mouth daily.  Marland Kitchen aspirin EC 81 MG tablet Take 81 mg by mouth daily.  Marland Kitchen azelastine (ASTELIN) 0.1 % nasal spray Place 2 sprays into both nostrils daily as needed for rhinitis.  Marland Kitchen benzonatate (TESSALON PERLES) 100 MG capsule Take 1 capsule (100 mg total) by mouth 3 (three) times daily as needed for cough. (Patient not taking: Reported on 10/18/2020)  . Carboxymethylcellul-Glycerin (LUBRICATING EYE DROPS OP) Apply 1 drop to eye daily as needed (dry eyes).   . carvedilol (COREG) 3.125 MG tablet Take 1 tablet (3.125 mg total) by mouth 2 (two) times daily with a meal.  . Fexofenadine HCl (ALLEGRA PO) Take 180 mg by mouth at bedtime.   Marland Kitchen glucose blood (ONETOUCH VERIO) test strip 1 each by Other route as needed for other. Use as instructed to check blood sugar 2 times per day dx: e11.65  . Lancets (ONETOUCH ULTRASOFT) lancets Use as instructed to check blood sugar 2 times per day dx code e11.65  . pantoprazole (PROTONIX) 40 MG tablet Take 1 tablet (40 mg total) by mouth 2 (two) times daily before a meal.  . rosuvastatin (CRESTOR) 20 MG tablet TAKE 1 TABLET BY  MOUTH ONCE DAILY (Patient taking differently: Take 20 mg by mouth daily.)  . sucralfate (CARAFATE) 1 GM/10ML suspension Take 10 mLs (1 g total) by mouth 4 (four) times daily -  with meals and at bedtime for 28 days.  Marland Kitchen trolamine salicylate (ASPERCREME) 10 % cream Apply 1 application topically daily as needed for muscle pain.  . vitamin B-12 (CYANOCOBALAMIN) 100 MCG tablet Take 100 mcg by mouth daily.  .  [DISCONTINUED] omeprazole (PRILOSEC) 20 MG capsule TAKE 1 CAPSULE BY MOUTH ONCE DAILY 30 MINUTES TO 1 HOUR BEFORE A MEAL (Patient taking differently: Take 20 mg by mouth daily.)   No facility-administered encounter medications on file as of 11/08/2020.    Patient Active Problem List   Diagnosis Date Noted  . Erosive esophagitis   . Hiatal hernia   . Hyponatremia 10/10/2020  . Acute kidney injury superimposed on chronic kidney disease ([place]) 10/10/2020  . Aortic atherosclerosis ([place]) 10/09/2020  . Acute renal failure superimposed on stage 3a chronic kidney disease ([place]) 10/09/2020  . Hyperkalemia 10/09/2020  . Hearing loss 10/09/2020  . Prolonged QT interval 10/09/2020  . Colitis, acute 08/11/2019  . Colitis 07/11/2019  . Loose stools 06/07/2019  . Herpes zoster without complication 10/93/2355  . Other long term (current) drug therapy 08/12/2018  . Chronic renal disease, stage III ([place]) 07/19/2018  . Hypertensive nephropathy 07/19/2018  . Community acquired pneumonia of right upper lobe of lung 11/07/2017  . Hypertensive emergency 07/01/2016  . Thalamic hemorrhage ([place]) 07/01/2016  . 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 Benign essential HTN   . Type 2 diabetes mellitus with stage 3 chronic kidney disease, without long-term current use of insulin ([place])   . Mixed hyperlipidemia   . ICH (intracerebral hemorrhage) ([place]) - hypertensive R thalamic hemorrhage  06/27/2016  . 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 Retroperitoneal bleed 02/08/2016  . Generalized abdominal pain   . Gastroenteritis 02/04/2016  . Diarrhea 02/04/2016  . Hypokalemia 02/04/2016  . Incarcerated paraesophageal hernia 09/02/2014  . Renal mass, left 09/02/2014  . Acute esophagitis 09/02/2014  . Gastric outlet obstruction 09/02/2014  . Hypertension   . Vomiting 09/01/2014    Conditions to be addressed/monitored: HTN and DMII  Care Plan : Social Work Care Plan  Updates made by Daneen Schick since  11/08/2020 12:00 AM    Problem: Care Coordination     Long-Range Goal: Collaborate with RN Care Manager to perform appropriate assessments to assist with care coordination needs   Start Date: 08/10/2020  Expected End Date: 12/08/2020  Recent Progress: On track  Priority: Low  Note:   Current Barriers:  . Chronic conditions including HTN, Colitis, and DM II which put patient at increased risk of hospitalization  Social Work Clinical Goal(s):  Marland Kitchen Over the next 120 days, patient will work with SW to address concerns related to care coordination  Interventions: . 1:1 collaboration with Anna Chard, MD regarding development and update of comprehensive plan of care as evidenced by provider attestation and co-signature . Inter-disciplinary care team collaboration (see longitudinal plan of care) . Successful outbound call placed to the patient to assist with care coordination needs . Determined the patient has not moved due to high housing prices- patient intends to stay where she is at the time being . Performed chart review to note the patient was hospitalized 3/8-3/12 with Hyperkalemia . Discussed the patient is doing better but still feels weak at times . Patient reports she has a future appointment on 4/22 -  transportation will be provided by her son . Patient reports she is almost out of diabetic testing strips but plans to contact her provider for a refill . Encouraged the patient to contact SW as needed . Scheduled follow up call over the next month  Patient Goals/Self-Care Activities Over the next 60 days, patient will:   - Patient will self administer medications as prescribed Patient will attend all scheduled provider appointments Contact SW as needed prior to next scheduled call   Follow up Plan: SW will follow up with patient by phone over the next month       Follow Up Plan: SW will follow up with patient by phone over the next month      Daneen Schick, BSW, CDP Social  Worker, Certified Dementia Practitioner [place] [facility] [phone]  Total time spent performing care coordination and/or care management activities with the patient by phone or face to face = 20 minutes.

## 2020-11-09 ENCOUNTER — Other Ambulatory Visit (HOSPITAL_COMMUNITY): Payer: Self-pay

## 2020-11-12 DIAGNOSIS — D1771 Benign lipomatous neoplasm of kidney: Secondary | ICD-10-CM | POA: Diagnosis not present

## 2020-11-12 DIAGNOSIS — Z8673 Personal history of transient ischemic attack (TIA), and cerebral infarction without residual deficits: Secondary | ICD-10-CM | POA: Diagnosis not present

## 2020-11-12 DIAGNOSIS — K259 Gastric ulcer, unspecified as acute or chronic, without hemorrhage or perforation: Secondary | ICD-10-CM | POA: Diagnosis not present

## 2020-11-12 DIAGNOSIS — K209 Esophagitis, unspecified without bleeding: Secondary | ICD-10-CM | POA: Diagnosis not present

## 2020-11-12 DIAGNOSIS — N2889 Other specified disorders of kidney and ureter: Secondary | ICD-10-CM | POA: Diagnosis not present

## 2020-11-12 DIAGNOSIS — N179 Acute kidney failure, unspecified: Secondary | ICD-10-CM | POA: Diagnosis not present

## 2020-11-12 DIAGNOSIS — K2210 Ulcer of esophagus without bleeding: Secondary | ICD-10-CM | POA: Diagnosis not present

## 2020-11-12 DIAGNOSIS — H9190 Unspecified hearing loss, unspecified ear: Secondary | ICD-10-CM | POA: Diagnosis not present

## 2020-11-12 DIAGNOSIS — M545 Low back pain, unspecified: Secondary | ICD-10-CM | POA: Diagnosis not present

## 2020-11-12 DIAGNOSIS — E875 Hyperkalemia: Secondary | ICD-10-CM | POA: Diagnosis not present

## 2020-11-12 DIAGNOSIS — Z9181 History of falling: Secondary | ICD-10-CM | POA: Diagnosis not present

## 2020-11-12 DIAGNOSIS — E1122 Type 2 diabetes mellitus with diabetic chronic kidney disease: Secondary | ICD-10-CM | POA: Diagnosis not present

## 2020-11-12 DIAGNOSIS — M1990 Unspecified osteoarthritis, unspecified site: Secondary | ICD-10-CM | POA: Diagnosis not present

## 2020-11-12 DIAGNOSIS — N1831 Chronic kidney disease, stage 3a: Secondary | ICD-10-CM | POA: Diagnosis not present

## 2020-11-12 DIAGNOSIS — I129 Hypertensive chronic kidney disease with stage 1 through stage 4 chronic kidney disease, or unspecified chronic kidney disease: Secondary | ICD-10-CM | POA: Diagnosis not present

## 2020-11-12 DIAGNOSIS — I700 Atherosclerosis of aorta: Secondary | ICD-10-CM | POA: Diagnosis not present

## 2020-11-12 DIAGNOSIS — E871 Hypo-osmolality and hyponatremia: Secondary | ICD-10-CM | POA: Diagnosis not present

## 2020-11-12 DIAGNOSIS — K529 Noninfective gastroenteritis and colitis, unspecified: Secondary | ICD-10-CM | POA: Diagnosis not present

## 2020-11-12 DIAGNOSIS — E782 Mixed hyperlipidemia: Secondary | ICD-10-CM | POA: Diagnosis not present

## 2020-11-12 DIAGNOSIS — K440 Diaphragmatic hernia with obstruction, without gangrene: Secondary | ICD-10-CM | POA: Diagnosis not present

## 2020-11-12 DIAGNOSIS — G8929 Other chronic pain: Secondary | ICD-10-CM | POA: Diagnosis not present

## 2020-11-13 ENCOUNTER — Encounter: Payer: Self-pay | Admitting: Internal Medicine

## 2020-11-13 ENCOUNTER — Other Ambulatory Visit: Payer: Self-pay

## 2020-11-13 ENCOUNTER — Ambulatory Visit (INDEPENDENT_AMBULATORY_CARE_PROVIDER_SITE_OTHER): Payer: Medicare Other | Admitting: Internal Medicine

## 2020-11-13 ENCOUNTER — Other Ambulatory Visit (HOSPITAL_COMMUNITY): Payer: Self-pay

## 2020-11-13 VITALS — BP 132/78 | HR 119 | Temp 97.7°F | Ht 63.0 in

## 2020-11-13 DIAGNOSIS — R000 Tachycardia, unspecified: Secondary | ICD-10-CM

## 2020-11-13 DIAGNOSIS — R404 Transient alteration of awareness: Secondary | ICD-10-CM | POA: Diagnosis not present

## 2020-11-13 DIAGNOSIS — R8290 Unspecified abnormal findings in urine: Secondary | ICD-10-CM

## 2020-11-13 DIAGNOSIS — R531 Weakness: Secondary | ICD-10-CM | POA: Diagnosis not present

## 2020-11-13 LAB — POCT URINALYSIS DIPSTICK
Bilirubin, UA: NEGATIVE
Blood, UA: NEGATIVE
Glucose, UA: NEGATIVE
Ketones, UA: NEGATIVE
Nitrite, UA: NEGATIVE
Protein, UA: POSITIVE — AB
Spec Grav, UA: 1.02 (ref 1.010–1.025)
Urobilinogen, UA: 0.2 E.U./dL
pH, UA: 5.5 (ref 5.0–8.0)

## 2020-11-13 MED ORDER — SUCRALFATE 1 GM/10ML PO SUSP: 1.0000 g | Freq: Three times a day (TID) | ORAL | 0 refills | Status: DC

## 2020-11-13 MED ORDER — PANTOPRAZOLE SODIUM 40 MG PO TBEC: 40.0000 mg | DELAYED_RELEASE_TABLET | Freq: Two times a day (BID) | ORAL | 1 refills | Status: DC

## 2020-11-13 NOTE — Progress Notes (Signed)
I,Katawbba Wiggins,acting as a Education administrator for Maximino Greenland, MD.,have documented all relevant documentation on the behalf of Maximino Greenland, MD,as directed by  Maximino Greenland, MD while in the presence of Maximino Greenland, MD.  This visit occurred during the SARS-CoV-2 public health emergency.  Safety protocols were in place, including screening questions prior to the visit, additional usage of staff PPE, and extensive cleaning of exam room while observing appropriate contact time as indicated for disinfecting solutions.  Subjective:     Patient ID: Anna Hoffman , female    DOB: 21-Sep-1934 , 86 y.o.   MRN: [id number]   Chief Complaint  Patient presents with  . Other    confusion    HPI  She was scheduled for an appt today because her granddaughter is concerned regarding her mental status. Apparently, last night she called her granddaughter at [place], wanting to know the time. Her GD felt she was disoriented. Apparently, her alarm clock went off, she is not sure why it was set incorrectly. She denies having recent fever, chills, cough, dysuria and frequency. She reports feeling "weak". Admits she has not been eating as much, reports she doesn't always have a good appetite.    Past Medical History:  Diagnosis Date  . Arthritis   . Diabetes mellitus without complication ([place])   . Hypertension   . Hypokalemia   . Pneumonia   . Shingles   . Stroke [facility])      Family History  Problem Relation Age of Onset  . Alzheimer's disease Mother   . Prostate cancer Father   . Brain cancer Brother   . Brain cancer Brother   . Pancreatic cancer Sister   . Allergic rhinitis Neg Hx   . Asthma Neg Hx   . Eczema Neg Hx   . Urticaria Neg Hx      Current Outpatient Medications:  .  acetaminophen (TYLENOL) 325 MG tablet, Take 2 tablets (650 mg total) by mouth every 6 (six) hours as needed for mild pain (or Fever >/= 101)., Disp: , Rfl:  .  amLODipine (NORVASC) 10 MG tablet, TAKE 1 TABLET BY MOUTH  ONCE DAILY (Patient taking differently: Take 10 mg by mouth daily.), Disp: 90 tablet, Rfl: 1 .  Ascorbic Acid (VITAMIN C) 1000 MG tablet, Take 1,000 mg by mouth daily., Disp: , Rfl:  .  azelastine (ASTELIN) 0.1 % nasal spray, Place 2 sprays into both nostrils daily as needed for rhinitis., Disp: , Rfl:  .  Fexofenadine HCl (ALLEGRA PO), Take 180 mg by mouth at bedtime. , Disp: , Rfl:  .  glucose blood (ONETOUCH VERIO) test strip, Use as instructed to check blood sugar 2 times per day dx: e11.65, Disp: 100 each, Rfl: 11 .  Lancets (ONETOUCH ULTRASOFT) lancets, Use as instructed to check blood sugar 2 times per day dx code e11.65, Disp: 100 each, Rfl: 12 .  trolamine salicylate (ASPERCREME) 10 % cream, Apply 1 application topically daily as needed for muscle pain., Disp: , Rfl:  .  vitamin B-12 (CYANOCOBALAMIN) 100 MCG tablet, Take 100 mcg by mouth daily., Disp: , Rfl:  .  benzonatate (TESSALON PERLES) 100 MG capsule, Take 1 capsule (100 mg total) by mouth 3 (three) times daily as needed for cough., Disp: 30 capsule, Rfl: 0 .  Carboxymethylcellul-Glycerin (LUBRICATING EYE DROPS OP), Apply 1 drop to eye daily as needed (dry eyes). , Disp: , Rfl:  .  carvedilol (COREG) 3.125 MG tablet, Take 1 tablet (3.125 mg  total) by mouth 2 (two) times daily with a meal., Disp: 180 tablet, Rfl: 0 .  pantoprazole (PROTONIX) 40 MG tablet, Take 1 tablet (40 mg total) by mouth 2 (two) times daily before a meal., Disp: 60 tablet, Rfl: 1 .  rosuvastatin (CRESTOR) 20 MG tablet, TAKE 1 TABLET BY MOUTH ONCE DAILY, Disp: 90 tablet, Rfl: 1 .  sucralfate (CARAFATE) 1 GM/10ML suspension, Take 10 mLs (1 g total) by mouth 4 (four) times daily -  with meals and at bedtime for 28 days., Disp: 1120 mL, Rfl: 0   Allergies  Allergen Reactions  . Amoxicillin Diarrhea    Has patient had a PCN reaction causing immediate rash, facial/tongue/throat swelling, SOB or lightheadedness with hypotension: no Has patient had a PCN reaction causing  severe rash involving mucus membranes or skin necrosis: no Has patient had a PCN reaction that required hospitalization: no pharmacist consult Has patient had a PCN reaction occurring within the last 10 years: yes If all of the above answers are "NO", then may proceed with Cephalosporin use.   . Ampicillin Rash    - Tolerates Rocephin and Ancef - Remote occurrence; no symptoms of anaphylaxis or severe cutaneous reaction, and no additional medical attention required    . Sulfa Antibiotics Rash     Review of Systems  Constitutional: Positive for appetite change and fever.  Respiratory: Negative.  Negative for cough and shortness of breath.   Cardiovascular: Negative.   Gastrointestinal: Negative.   Genitourinary: Negative for frequency.  Psychiatric/Behavioral: Negative.   All other systems reviewed and are negative.    Today's Vitals   11/13/20 1217  BP: 132/78  Pulse: (!) 119  Temp: 97.7 F (36.5 C)  TempSrc: Oral  Height: 5\' 3"  (1.6 m)  PainSc: 0-No pain   Body mass index is 25.9 kg/m.  Wt Readings from Last 3 Encounters:  11/23/20 140 lb (63.5 kg)  11/14/20 145 lb 8.1 oz (66 kg)  10/18/20 146 lb 3.2 oz (66.3 kg)   Objective:  Physical Exam Vitals and nursing note reviewed.  Constitutional:      Appearance: She is not diaphoretic.     Comments: Looks tired  HENT:     Head: Normocephalic and atraumatic.     Nose:     Comments: Masked     Mouth/Throat:     Comments: Masked  Cardiovascular:     Rate and Rhythm: Normal rate and regular rhythm.     Heart sounds: Normal heart sounds.  Pulmonary:     Effort: Pulmonary effort is normal.     Breath sounds: Normal breath sounds.  Skin:    General: Skin is warm.  Neurological:     General: No focal deficit present.     Mental Status: She is alert.  Psychiatric:        Mood and Affect: Mood normal.        Behavior: Behavior normal.         Assessment And Plan:     1. Transient alteration of  awareness Comments: I will check urinalysis to r/o UTI. If negative, I will do further labwork.  - POCT Urinalysis Dipstick (81002)  2. Weakness Comments: She agrees to home PT. Also advised of need for improved nutrition to help with energy levels.   3. Abnormal urine Comments: I will send off urine culture.  - Culture, Urine  4. Tachycardia Comments: PT advised of likely dehydration. Encouraged to increase water/fluid intake. I will check renal function/electrolytes today.  Patient was given opportunity to ask questions. Patient verbalized understanding of the plan and was able to repeat key elements of the plan. All questions were answered to their satisfaction.   I, Maximino Greenland, MD, have reviewed all documentation for this visit. The documentation on 11/13/20 for the exam, diagnosis, procedures, and orders are all accurate and complete.   IF YOU HAVE BEEN REFERRED TO A SPECIALIST, IT MAY TAKE 1-2 WEEKS TO SCHEDULE/PROCESS THE REFERRAL. IF YOU HAVE NOT HEARD FROM US/SPECIALIST IN TWO WEEKS, PLEASE GIVE [place] A CALL AT [phone].   THE PATIENT IS ENCOURAGED TO PRACTICE SOCIAL DISTANCING DUE TO THE COVID-19 PANDEMIC.

## 2020-11-14 ENCOUNTER — Other Ambulatory Visit (HOSPITAL_COMMUNITY): Payer: Self-pay

## 2020-11-14 ENCOUNTER — Emergency Department (HOSPITAL_COMMUNITY): Payer: Medicare Other

## 2020-11-14 ENCOUNTER — Other Ambulatory Visit: Payer: Self-pay

## 2020-11-14 ENCOUNTER — Inpatient Hospital Stay (HOSPITAL_COMMUNITY)
Admission: EM | Admit: 2020-11-14 | Discharge: 2020-11-17 | DRG: 193 | Disposition: A | Discharge status: Home Health | Payer: Medicare Other | Attending: Internal Medicine | Admitting: Internal Medicine

## 2020-11-14 DIAGNOSIS — I129 Hypertensive chronic kidney disease with stage 1 through stage 4 chronic kidney disease, or unspecified chronic kidney disease: Secondary | ICD-10-CM | POA: Diagnosis present

## 2020-11-14 DIAGNOSIS — Z880 Allergy status to penicillin: Secondary | ICD-10-CM

## 2020-11-14 DIAGNOSIS — I10 Essential (primary) hypertension: Secondary | ICD-10-CM | POA: Diagnosis not present

## 2020-11-14 DIAGNOSIS — E782 Mixed hyperlipidemia: Secondary | ICD-10-CM | POA: Diagnosis not present

## 2020-11-14 DIAGNOSIS — R197 Diarrhea, unspecified: Secondary | ICD-10-CM | POA: Diagnosis not present

## 2020-11-14 DIAGNOSIS — Z90710 Acquired absence of both cervix and uterus: Secondary | ICD-10-CM

## 2020-11-14 DIAGNOSIS — R8271 Bacteriuria: Secondary | ICD-10-CM | POA: Diagnosis not present

## 2020-11-14 DIAGNOSIS — K2210 Ulcer of esophagus without bleeding: Secondary | ICD-10-CM | POA: Diagnosis present

## 2020-11-14 DIAGNOSIS — R531 Weakness: Secondary | ICD-10-CM | POA: Diagnosis not present

## 2020-11-14 DIAGNOSIS — N183 Chronic kidney disease, stage 3 unspecified: Secondary | ICD-10-CM | POA: Diagnosis present

## 2020-11-14 DIAGNOSIS — E876 Hypokalemia: Secondary | ICD-10-CM | POA: Diagnosis not present

## 2020-11-14 DIAGNOSIS — N1831 Chronic kidney disease, stage 3a: Secondary | ICD-10-CM | POA: Diagnosis present

## 2020-11-14 DIAGNOSIS — K449 Diaphragmatic hernia without obstruction or gangrene: Secondary | ICD-10-CM | POA: Diagnosis present

## 2020-11-14 DIAGNOSIS — Z882 Allergy status to sulfonamides status: Secondary | ICD-10-CM

## 2020-11-14 DIAGNOSIS — R112 Nausea with vomiting, unspecified: Secondary | ICD-10-CM | POA: Diagnosis not present

## 2020-11-14 DIAGNOSIS — R9431 Abnormal electrocardiogram [ECG] [EKG]: Secondary | ICD-10-CM | POA: Diagnosis present

## 2020-11-14 DIAGNOSIS — Z8673 Personal history of transient ischemic attack (TIA), and cerebral infarction without residual deficits: Secondary | ICD-10-CM | POA: Diagnosis not present

## 2020-11-14 DIAGNOSIS — K2211 Ulcer of esophagus with bleeding: Secondary | ICD-10-CM | POA: Diagnosis not present

## 2020-11-14 DIAGNOSIS — R059 Cough, unspecified: Secondary | ICD-10-CM | POA: Diagnosis not present

## 2020-11-14 DIAGNOSIS — E860 Dehydration: Secondary | ICD-10-CM | POA: Diagnosis present

## 2020-11-14 DIAGNOSIS — E1122 Type 2 diabetes mellitus with diabetic chronic kidney disease: Secondary | ICD-10-CM | POA: Diagnosis present

## 2020-11-14 DIAGNOSIS — R195 Other fecal abnormalities: Secondary | ICD-10-CM

## 2020-11-14 DIAGNOSIS — Z66 Do not resuscitate: Secondary | ICD-10-CM | POA: Diagnosis present

## 2020-11-14 DIAGNOSIS — B9689 Other specified bacterial agents as the cause of diseases classified elsewhere: Secondary | ICD-10-CM | POA: Diagnosis not present

## 2020-11-14 DIAGNOSIS — Z79899 Other long term (current) drug therapy: Secondary | ICD-10-CM | POA: Diagnosis not present

## 2020-11-14 DIAGNOSIS — J189 Pneumonia, unspecified organism: Principal | ICD-10-CM | POA: Diagnosis present

## 2020-11-14 DIAGNOSIS — N1832 Chronic kidney disease, stage 3b: Secondary | ICD-10-CM | POA: Diagnosis not present

## 2020-11-14 DIAGNOSIS — Z20822 Contact with and (suspected) exposure to covid-19: Secondary | ICD-10-CM | POA: Diagnosis present

## 2020-11-14 DIAGNOSIS — K922 Gastrointestinal hemorrhage, unspecified: Secondary | ICD-10-CM | POA: Diagnosis not present

## 2020-11-14 DIAGNOSIS — K920 Hematemesis: Secondary | ICD-10-CM | POA: Diagnosis not present

## 2020-11-14 DIAGNOSIS — Z86010 Personal history of colonic polyps: Secondary | ICD-10-CM | POA: Diagnosis not present

## 2020-11-14 DIAGNOSIS — N179 Acute kidney failure, unspecified: Secondary | ICD-10-CM | POA: Diagnosis present

## 2020-11-14 LAB — CBC WITH DIFFERENTIAL/PLATELET
Abs Immature Granulocytes: 0.08 10*3/uL — ABNORMAL HIGH (ref 0.00–0.07)
Basophils Absolute: 0.1 10*3/uL (ref 0.0–0.1)
Basophils Relative: 1 %
Eosinophils Absolute: 0.1 10*3/uL (ref 0.0–0.5)
Eosinophils Relative: 1 %
HCT: 38.4 % (ref 36.0–46.0)
Hemoglobin: 12.8 g/dL (ref 12.0–15.0)
Immature Granulocytes: 1 %
Lymphocytes Relative: 19 %
Lymphs Abs: 2.1 10*3/uL (ref 0.7–4.0)
MCH: 30 pg (ref 26.0–34.0)
MCHC: 33.3 g/dL (ref 30.0–36.0)
MCV: 89.9 fL (ref 80.0–100.0)
Monocytes Absolute: 0.8 10*3/uL (ref 0.1–1.0)
Monocytes Relative: 8 %
Neutro Abs: 7.9 10*3/uL — ABNORMAL HIGH (ref 1.7–7.7)
Neutrophils Relative %: 70 %
Platelets: 500 10*3/uL — ABNORMAL HIGH (ref 150–400)
RBC: 4.27 MIL/uL (ref 3.87–5.11)
RDW: 15 % (ref 11.5–15.5)
WBC: 11 10*3/uL — ABNORMAL HIGH (ref 4.0–10.5)
nRBC: 0 % (ref 0.0–0.2)

## 2020-11-14 LAB — COMPREHENSIVE METABOLIC PANEL
ALT: 14 U/L (ref 0–44)
AST: 24 U/L (ref 15–41)
Albumin: 3.6 g/dL (ref 3.5–5.0)
Alkaline Phosphatase: 69 U/L (ref 38–126)
Anion gap: 12 (ref 5–15)
BUN: 24 mg/dL — ABNORMAL HIGH (ref 8–23)
CO2: 15 mmol/L — ABNORMAL LOW (ref 22–32)
Calcium: 9.4 mg/dL (ref 8.9–10.3)
Chloride: 111 mmol/L (ref 98–111)
Creatinine, Ser: 1.21 mg/dL — ABNORMAL HIGH (ref 0.44–1.00)
GFR, Estimated: 44 mL/min — ABNORMAL LOW (ref >60)
Glucose, Bld: 110 mg/dL — ABNORMAL HIGH (ref 70–99)
Potassium: 3.9 mmol/L (ref 3.5–5.1)
Sodium: 138 mmol/L (ref 135–145)
Total Bilirubin: 1.3 mg/dL — ABNORMAL HIGH (ref 0.3–1.2)
Total Protein: 7.9 g/dL (ref 6.5–8.1)

## 2020-11-14 LAB — URINALYSIS, ROUTINE W REFLEX MICROSCOPIC
Bilirubin Urine: NEGATIVE
Glucose, UA: NEGATIVE mg/dL
Hgb urine dipstick: NEGATIVE
Ketones, ur: 20 mg/dL — AB
Nitrite: NEGATIVE
Protein, ur: 30 mg/dL — AB
Specific Gravity, Urine: 1.02 (ref 1.005–1.030)
pH: 5 (ref 5.0–8.0)

## 2020-11-14 LAB — TYPE AND SCREEN
ABO/RH(D): AB POS
Antibody Screen: NEGATIVE

## 2020-11-14 LAB — POC OCCULT BLOOD, ED: Fecal Occult Bld: POSITIVE — AB

## 2020-11-14 MED ORDER — SODIUM CHLORIDE 0.9 % IV SOLN
80.0000 mg | Freq: Once | INTRAVENOUS | Status: AC
  Administered 2020-11-14: 80 mg via INTRAVENOUS
  Filled 2020-11-14: qty 80

## 2020-11-14 MED ORDER — PANTOPRAZOLE SODIUM 40 MG IV SOLR: 40.0000 mg | Freq: Two times a day (BID) | INTRAVENOUS | Status: DC

## 2020-11-14 MED ORDER — SODIUM CHLORIDE 0.9 % IV BOLUS
1000.0000 mL | Freq: Once | INTRAVENOUS | Status: AC
  Administered 2020-11-14: 1000 mL via INTRAVENOUS

## 2020-11-14 MED ORDER — SODIUM CHLORIDE 0.9 % IV SOLN
1.0000 g | Freq: Once | INTRAVENOUS | Status: AC
  Administered 2020-11-14: 1 g via INTRAVENOUS
  Filled 2020-11-14: qty 10

## 2020-11-14 MED ORDER — DOXYCYCLINE HYCLATE 100 MG PO TABS
100.0000 mg | ORAL_TABLET | Freq: Once | ORAL | Status: AC
  Administered 2020-11-14: 100 mg via ORAL
  Filled 2020-11-14: qty 1

## 2020-11-14 MED ORDER — CARVEDILOL 3.125 MG PO TABS
3.1250 mg | ORAL_TABLET | Freq: Once | ORAL | Status: AC
  Administered 2020-11-14: 3.125 mg via ORAL
  Filled 2020-11-14: qty 1

## 2020-11-14 MED ORDER — CARVEDILOL 3.125 MG PO TABS
3.1250 mg | ORAL_TABLET | Freq: Two times a day (BID) | ORAL | 0 refills | Status: DC
  Filled 2020-11-14: qty 180, 90d supply, fill #0

## 2020-11-14 MED ORDER — SODIUM CHLORIDE 0.9 % IV BOLUS
500.0000 mL | Freq: Once | INTRAVENOUS | Status: AC
  Administered 2020-11-14: 500 mL via INTRAVENOUS

## 2020-11-14 MED ORDER — CEPHALEXIN 500 MG PO CAPS
500.0000 mg | ORAL_CAPSULE | Freq: Once | ORAL | Status: AC
  Administered 2020-11-14: 500 mg via ORAL
  Filled 2020-11-14: qty 1

## 2020-11-14 MED ORDER — SODIUM CHLORIDE 0.9 % IV SOLN
8.0000 mg/h | INTRAVENOUS | Status: DC
  Administered 2020-11-14: 8 mg/h via INTRAVENOUS
  Filled 2020-11-14: qty 80

## 2020-11-14 NOTE — ED Provider Notes (Signed)
[place] DEPT Provider Note   CSN: [id number] Arrival date & time: 11/14/20  1659     History Chief Complaint  Patient presents with  . Weakness  . Emesis    Anna Hoffman is a 86 y.o. female.  Anna Hoffman is a 86 y.o. female with a history of hypertension, diabetes, stroke, arthritis, erosive esophagitis, who presents to the emergency department for evaluation of generalized weakness and emesis.  Patient reports that she has been feeling generally weak and unwell over the past week, reports she feels like she has no energy.  Reports that when she tries to walk she sometimes feels lightheaded but has not passed out.  She reports she did get tangled up with her walker today and fell landing on her bottom, did not hit her head, no injuries from fall.  She reports she has been feeling quite poorly and saw her PCP yesterday, who felt that she was likely dehydrated, checked her urine which they said looked good and sent her home, she reports today she had 2 episodes of emesis the second of which contained some blood so she decided to come in for evaluation.        Past Medical History:  Diagnosis Date  . Arthritis   . Diabetes mellitus without complication ([place])   . Hypertension   . Hypokalemia   . Pneumonia   . Shingles   . Stroke [facility])     Patient Active Problem List   Diagnosis Date Noted  . Erosive esophagitis   . Hiatal hernia   . Hyponatremia 10/10/2020  . Acute kidney injury superimposed on chronic kidney disease ([place]) 10/10/2020  . Aortic atherosclerosis ([place]) 10/09/2020  . Acute renal failure superimposed on stage 3a chronic kidney disease ([place]) 10/09/2020  . Hyperkalemia 10/09/2020  . Hearing loss 10/09/2020  . Prolonged QT interval 10/09/2020  . Colitis, acute 08/11/2019  . Colitis 07/11/2019  . Loose stools 06/07/2019  . Herpes zoster without complication 23/30/0762  . Other long term (current) drug therapy 08/12/2018  .  Chronic renal disease, stage III ([place]) 07/19/2018  . Hypertensive nephropathy 07/19/2018  . Community acquired pneumonia of right upper lobe of lung 11/07/2017  . Hypertensive emergency 07/01/2016  . Thalamic hemorrhage ([place]) 07/01/2016  . 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)   . Benign essential HTN   . Type 2 diabetes mellitus with stage 3 chronic kidney disease, without long-term current use of insulin ([place])   . Mixed hyperlipidemia   . ICH (intracerebral hemorrhage) ([place]) - hypertensive R thalamic hemorrhage  06/27/2016  . 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 Retroperitoneal bleed 02/08/2016  . Generalized abdominal pain   . Gastroenteritis 02/04/2016  . Diarrhea 02/04/2016  . Hypokalemia 02/04/2016  . Incarcerated paraesophageal hernia 09/02/2014  . Renal mass, left 09/02/2014  . Acute esophagitis 09/02/2014  . Gastric outlet obstruction 09/02/2014  . Hypertension   . Vomiting 09/01/2014    Past Surgical History:  Procedure Laterality Date  . ABDOMINAL HYSTERECTOMY    . BREAST EXCISIONAL BIOPSY Left   . ESOPHAGOGASTRODUODENOSCOPY N/A 09/01/2014   Procedure: ESOPHAGOGASTRODUODENOSCOPY (EGD);  Surgeon: Lear Ng, MD;  Location: [facility];  Service: Endoscopy;  Laterality: N/A;  . ESOPHAGOGASTRODUODENOSCOPY (EGD) WITH PROPOFOL N/A 10/12/2020   Procedure: ESOPHAGOGASTRODUODENOSCOPY (EGD) WITH PROPOFOL;  Surgeon: Mauri Pole, MD;  Location: [facility];  Service: Endoscopy;  Laterality: N/A;  . HIATAL HERNIA REPAIR N/A 09/04/2014   Procedure: LAPAROSCOPIC REPAIR OF HIATAL HERNIA;  Surgeon:  Excell Seltzer, MD;  Location: [facility]S;  Service: General;  Laterality: N/A;  With MESH  . IR GENERIC HISTORICAL  04/10/2016   IR US GUIDE VASC ACCESS RIGHT 04/10/2016 Corrie Mckusick, DO [facility]  . IR GENERIC HISTORICAL  04/10/2016   IR ANGIOGRAM SELECTIVE EACH ADDITIONAL VESSEL 04/10/2016 Corrie Mckusick, DO [facility]  . IR GENERIC HISTORICAL  04/10/2016   IR ANGIOGRAM  SELECTIVE EACH ADDITIONAL VESSEL 04/10/2016 Corrie Mckusick, DO [facility]  . IR GENERIC HISTORICAL  04/10/2016   IR EMBO TUMOR ORGAN ISCHEMIA INFARCT INC GUIDE ROADMAPPING 04/10/2016 Corrie Mckusick, DO [facility]  . IR GENERIC HISTORICAL  04/10/2016   IR ANGIOGRAM SELECTIVE EACH ADDITIONAL VESSEL 04/10/2016 Corrie Mckusick, DO [facility]  . IR GENERIC HISTORICAL  04/10/2016   IR ANGIOGRAM SELECTIVE EACH ADDITIONAL VESSEL 04/10/2016 Corrie Mckusick, DO [facility]  . IR GENERIC HISTORICAL  04/10/2016   IR RENAL SELECTIVE  UNI INC S&I MOD SED 04/10/2016 Corrie Mckusick, DO [facility]  . IR GENERIC HISTORICAL  03/06/2016   IR RADIOLOGIST EVAL & MGMT 03/06/2016 Corrie Mckusick, DO [facility]  . IR GENERIC HISTORICAL  03/26/2016   IR RADIOLOGIST EVAL & MGMT 03/26/2016 Corrie Mckusick, DO [facility]  . IR GENERIC HISTORICAL  04/29/2016   IR RADIOLOGIST EVAL & MGMT 04/29/2016 [facility]  . IR RADIOLOGIST EVAL & MGMT  11/12/2016  . IR RADIOLOGIST EVAL & MGMT  12/16/2017  . KNEE SURGERY    . TONSILLECTOMY       OB History   No obstetric history on file.     Family History  Problem Relation Age of Onset  . Alzheimer's disease Mother   . Prostate cancer Father   . Brain cancer Brother   . Brain cancer Brother   . Pancreatic cancer Sister   . Allergic rhinitis Neg Hx   . Asthma Neg Hx   . Eczema Neg Hx   . Urticaria Neg Hx     Social History   Tobacco Use  . Smoking status: Never Smoker  . Smokeless tobacco: Never Used  Vaping Use  . Vaping Use: Never used  Substance Use Topics  . Alcohol use: No  . Drug use: No    Home Medications Prior to Admission medications   Medication Sig Start Date End Date Taking? Authorizing Provider  acetaminophen (TYLENOL) 325 MG tablet Take 2 tablets (650 mg total) by mouth every 6 (six) hours as needed for mild pain (or Fever >/= 101). 02/08/16   Johnson, Clanford L, MD  amLODipine (NORVASC) 10 MG tablet TAKE 1 TABLET BY MOUTH ONCE DAILY Patient taking  differently: Take 10 mg by mouth daily. 08/24/20 08/24/21  Glendale Chard, MD  Ascorbic Acid (VITAMIN C) 1000 MG tablet Take 1,000 mg by mouth daily.    [provider]  azelastine (ASTELIN) 0.1 % nasal spray Place 2 sprays into both nostrils daily as needed for rhinitis. 10/13/20   Hongalgi, Lenis Dickinson, MD  benzonatate (TESSALON PERLES) 100 MG capsule Take 1 capsule (100 mg total) by mouth 3 (three) times daily as needed for cough. Patient not taking: No sig reported 12/10/19   Glendale Chard, MD  Carboxymethylcellul-Glycerin (LUBRICATING EYE DROPS OP) Apply 1 drop to eye daily as needed (dry eyes).     [provider]  carvedilol (COREG) 3.125 MG tablet Take 1 tablet (3.125 mg total) by mouth 2 (two) times daily with a meal. 11/14/20   Glendale Chard, MD  Fexofenadine HCl [facility]  PO) Take 180 mg by mouth at bedtime.     [provider]  glucose blood (ONETOUCH VERIO) test strip Use as instructed to check blood sugar 2 times per day dx: e11.65 11/08/20   Glendale Chard, MD  Lancets [facility] ULTRASOFT) lancets Use as instructed to check blood sugar 2 times per day dx code e11.65 10/19/19   Glendale Chard, MD  pantoprazole (PROTONIX) 40 MG tablet Take 1 tablet (40 mg total) by mouth 2 (two) times daily before a meal. 11/13/20   Glendale Chard, MD  rosuvastatin (CRESTOR) 20 MG tablet TAKE 1 TABLET BY MOUTH ONCE DAILY Patient not taking: Reported on 11/13/2020 08/31/20 08/31/21  Glendale Chard, MD  sucralfate (CARAFATE) 1 GM/10ML suspension Take 10 mLs (1 g total) by mouth 4 (four) times daily -  with meals and at bedtime for 28 days. 11/13/20 12/11/20  Glendale Chard, MD  trolamine salicylate (ASPERCREME) 10 % cream Apply 1 application topically daily as needed for muscle pain.    [provider]  vitamin B-12 (CYANOCOBALAMIN) 100 MCG tablet Take 100 mcg by mouth daily.    [provider]  omeprazole (PRILOSEC) 20 MG capsule TAKE 1 CAPSULE BY MOUTH ONCE DAILY 30 MINUTES TO  1 HOUR BEFORE A MEAL Patient taking differently: Take 20 mg by mouth daily. 08/06/20 10/13/20  Glendale Chard, MD    Allergies    Amoxicillin, Ampicillin, and Sulfa antibiotics  Review of Systems   Review of Systems  Constitutional: Negative for chills and fever.  HENT: Negative.   Eyes: Negative for visual disturbance.  Respiratory: Negative for cough and shortness of breath.   Cardiovascular: Negative for chest pain.  Gastrointestinal: Positive for vomiting. Negative for abdominal pain, blood in stool, diarrhea and nausea.  Genitourinary: Negative for dysuria and frequency.  Musculoskeletal: Negative for arthralgias and myalgias.  Skin: Negative for color change and rash.  Neurological: Positive for weakness (Generalized) and light-headedness. Negative for dizziness, syncope, numbness and headaches.  All other systems reviewed and are negative.   Physical Exam Updated Vital Signs BP (!) 148/80 (BP Location: Right Arm)   Pulse (!) 118   Resp 18   Ht 5\' 3"  (1.6 m)   Wt 66 kg   LMP  (LMP Unknown)   SpO2 96%   BMI 25.77 kg/m   Physical Exam Vitals and nursing note reviewed. Exam conducted with a chaperone present.  Constitutional:      General: She is not in acute distress.    Appearance: Normal appearance. She is well-developed. She is not ill-appearing or diaphoretic.  HENT:     Head: Normocephalic and atraumatic.     Mouth/Throat:     Mouth: Mucous membranes are moist.     Pharynx: Oropharynx is clear.  Eyes:     General:        Right eye: No discharge.        Left eye: No discharge.  Cardiovascular:     Rate and Rhythm: Regular rhythm. Tachycardia present.     Pulses: Normal pulses.     Heart sounds: Normal heart sounds. No murmur heard. No friction rub. No gallop.   Pulmonary:     Effort: Pulmonary effort is normal. No respiratory distress.     Breath sounds: Normal breath sounds. No wheezing or rales.     Comments: Respirations equal and unlabored, patient  able to speak in full sentences, lungs clear to auscultation bilaterally  Abdominal:     General: Bowel sounds are normal. There is  no distension.     Palpations: Abdomen is soft. There is no mass.     Tenderness: There is no abdominal tenderness. There is no guarding.     Comments: Abdomen soft, nondistended, nontender to palpation in all quadrants without guarding or peritoneal signs   Genitourinary:    Comments: Chaperone present during rectal exam, soft brown stool present with no gross visible blood or melena Musculoskeletal:        General: No deformity.     Cervical back: Neck supple.  Skin:    General: Skin is warm and dry.     Capillary Refill: Capillary refill takes less than 2 seconds.  Neurological:     Mental Status: She is alert.     Coordination: Coordination normal.     Comments: Speech is clear, able to follow commands Moves extremities without ataxia, coordination intact  Psychiatric:        Mood and Affect: Mood normal.        Behavior: Behavior normal.     ED Results / Procedures / Treatments   Labs (all labs ordered are listed, but only abnormal results are displayed) Labs Reviewed  COMPREHENSIVE METABOLIC PANEL - Abnormal; Notable for the following components:      Result Value   CO2 15 (*)    Glucose, Bld 110 (*)    BUN 24 (*)    Creatinine, Ser 1.21 (*)    Total Bilirubin 1.3 (*)    GFR, Estimated 44 (*)    All other components within normal limits  CBC WITH DIFFERENTIAL/PLATELET - Abnormal; Notable for the following components:   WBC 11.0 (*)    Platelets 500 (*)    Neutro Abs 7.9 (*)    Abs Immature Granulocytes 0.08 (*)    All other components within normal limits  URINALYSIS, ROUTINE W REFLEX MICROSCOPIC - Abnormal; Notable for the following components:   Ketones, ur 20 (*)    Protein, ur 30 (*)    Leukocytes,Ua SMALL (*)    Bacteria, UA MANY (*)    All other components within normal limits  POC OCCULT BLOOD, ED - Abnormal; Notable for the  following components:   Fecal Occult Bld POSITIVE (*)    All other components within normal limits  URINE CULTURE  RESP PANEL BY RT-PCR (FLU A&B, COVID) [id number]  TYPE AND SCREEN    EKG EKG Interpretation  Date/Time:  Wednesday November 14 2020 17:31:46 EDT Ventricular Rate:  113 PR Interval:    QRS Duration: 82 QT Interval:  453 QTC Calculation: 622 R Axis:   49 Text Interpretation: Sinus tachycardia Probable anterior infarct, old Prolonged QT interval Confirmed by Dene Gentry [phone]) on 11/14/2020 5:39:16 PM   Radiology DG Chest Port 1 View  Result Date: 11/14/2020 CLINICAL DATA:  Cough flu like symptom EXAM: PORTABLE CHEST 1 VIEW COMPARISON:  01/31/2020 FINDINGS: Left lung is clear. Focal airspace disease in the periphery of the right mid lung. Stable cardiomediastinal silhouette with aortic atherosclerosis. No pneumothorax IMPRESSION: Focal airspace disease in the periphery of the right mid lung suspicious for pneumonia. Electronically Signed   By: Donavan Foil M.D.   On: 11/14/2020 23:15    Procedures Procedures   Medications Ordered in ED Medications  cefTRIAXone (ROCEPHIN) 1 g in sodium chloride 0.9 % 100 mL IVPB (1 g Intravenous New Bag/Given 11/14/20 2351)  sodium chloride 0.9 % bolus 1,000 mL (0 mLs Intravenous Stopped 11/14/20 1818)  pantoprazole (PROTONIX) 80 mg in sodium chloride 0.9 % 100  mL IVPB (0 mg Intravenous Stopped 11/14/20 1816)  sodium chloride 0.9 % bolus 500 mL (0 mLs Intravenous Stopped 11/14/20 2149)  cephALEXin (KEFLEX) capsule 500 mg (500 mg Oral Given 11/14/20 2309)  carvedilol (COREG) tablet 3.125 mg (3.125 mg Oral Given 11/14/20 2312)  doxycycline (VIBRA-TABS) tablet 100 mg (100 mg Oral Given 11/14/20 2350)    ED Course  I have reviewed the triage vital signs and the nursing notes.  Pertinent labs & imaging results that were available during my care of the patient were reviewed by me and considered in my medical decision making (see chart for  details).    MDM Rules/Calculators/A&P                         86 y.o. female presents to the ED with complaints of weakness, hematemesis, this involves an extensive number of treatment options, and is a complaint that carries with it a high risk of complications and morbidity.  The differential diagnosis includes anemia, infection, infection metabolic derangement, stroke.  On arrival pt is nontoxic, patient noted to be tachycardic, but afebrile, mildly hypertensive.  On exam patient with clear lungs, nontender abdomen, rectal exam with no gross blood or melena noted  Additional history obtained from chart review. Previous records obtained and reviewed via EMR  I ordered IV fluids and Protonix for tachycardia, and potential upper GI bleed  Lab Tests:  I Ordered, reviewed, and interpreted labs, which included:  CBC: Mild leukocytosis of 11.0, normal hemoglobin of 12.8, slightly improved when compared to labs from a few weeks ago CMP: CO2 of 15 but no other significant electrolyte derangements, no anion gap, creatinine of 1.21 and BUN of 24, likely suggesting dehydration, T bili of 1.3 but no other abnormal LFTs Hemoccult: Positive UA: Many bacteria with small leukocytes, no WBCs or RBCs noted, culture sent   Patient initially denied cough or respiratory symptoms but later reported frequent and worsening cough over the past week that she forgot about initially, will order chest x-ray and Covid test.  Imaging Studies ordered:  I ordered imaging studies which included chest x-ray, I independently visualized and interpreted imaging which showed focal airspace disease in the periphery of the right midlung suspicious for pneumonia  Covid: Negative  ED Course:   Patient with mild AKI, given 1500 mL IV fluids.  Also noted to have possible UTI with many bacteria present and chest x-ray which shows pneumonia.  Patient with heme positive stools and a reported episode of hematemesis prior to  arrival, no additional hematemesis or vomiting here in the ED and hemoglobin is appropriate but patient remains tachycardic.  Suspect tachycardia may be multifactorial in the setting of dehydration, potential bleeding, as well as missed dose of Coreg, Coreg given here in the ED.  Patient started on IV Rocephin and Zithromax for pneumonia and possible UTI.  Will consult medicine for admission.  Also place consult through secure chat with GI for evaluation in the morning.  I consulted Dr. Roel Cluck with [facility] hospitalist and discussed lab and imaging findings, she will see and admit the patient.   Portions of this note were generated with [employer]. Dictation errors may occur despite best attempts at proofreading.  Final Clinical Impression(s) / ED Diagnoses Final diagnoses:  Community acquired pneumonia of right middle lobe of lung  Generalized weakness  Occult GI bleeding    Rx / DC Orders ED Discharge Orders    None  Jacqlyn Larsen, PA-C 11/15/20 0048    Valarie Merino, MD 11/16/20 2034

## 2020-11-14 NOTE — H&P (Addendum)
Anna Hoffman KZS:[id number] DOB: 12/26/1934 DOA: 11/14/2020     PCP: Glendale Chard, MD   Outpatient Specialists:    GI Dr.Nandigam       Patient arrived to ER on 11/14/20 at 1659 Referred by Attending Valarie Merino, MD   Patient coming from: home Lives   With family    Chief Complaint:  Chief Complaint  Patient presents with  . Weakness  . Emesis    HPI: Anna Hoffman is a 86 y.o. female with medical history significant of Dm2, HTN  CVA    Presented with  1 wk of fatigue no energy light headed, today had 2 episodes of emeses had blood in second, has hx of erosive esophagitis,  Reports some diarrhea as well.  Recently was seen by PCP though to have dehydration Has been coughing for 1 week   Infectious risk factors:  Reports  Nausea      Has  been vaccinated against COVID     Initial COVID TEST  NEGATIVE   Lab Results  Component Value Date   SARSCOV2NAA NEGATIVE 10/09/2020   [place] NEGATIVE 01/31/2020   [place] RESULT:  NEGATIVE 09/02/2019   SARSCOV2NAA RESULT:  NEGATIVE 08/22/2019    Regarding pertinent Chronic problems:     Hyperlipidemia -  on statins Crestor Lipid Panel     Component Value Date/Time   CHOL 150 10/18/2020 1549   TRIG 153 (H) 10/18/2020 1549   HDL 74 10/18/2020 1549   CHOLHDL 2.0 10/18/2020 1549   CHOLHDL 3.5 06/29/2016 0330   VLDL 36 06/29/2016 0330   LDLCALC 51 10/18/2020 1549   LABVLDL 25 10/18/2020 1549     HTN on Norvasc, Coreg  DM 2 -  Lab Results  Component Value Date   HGBA1C 6.7 (H) 10/10/2020   diet controlled   Hx of CVA -  with/out residual deficits on Aspirin 81    CKD stage IIIb- baseline Cr 1.1 Estimated Creatinine Clearance: 32.1 mL/min (A) (by C-G formula based on SCr of 1.21 mg/dL (H)).  Lab Results  Component Value Date   CREATININE 1.21 (H) 11/14/2020   CREATININE 0.98 10/18/2020   CREATININE 1.03 (H) 10/13/2020    While in ER: Tachycardic She 1500 ns and coreg Started on  PPI CXR showed PNA Started on rocephin/ azithro Urine cultures     ED Triage Vitals  Enc Vitals Group     BP 11/14/20 1706 (!) 148/80     Pulse Rate 11/14/20 1706 (!) 118     Resp 11/14/20 1706 18     Temp 11/14/20 1710 98.2 F (36.8 C)     Temp Source 11/14/20 1710 Oral     SpO2 11/14/20 1706 96 %     Weight 11/14/20 1705 145 lb 8.1 oz (66 kg)     Height 11/14/20 1705 5\' 3"  (1.6 m)     Head Circumference --      Peak Flow --      Pain Score 11/14/20 1705 0     Pain Loc --      Pain Edu? --      Excl. in [place]? --   TMAX(24)@     _________________________________________ Significant initial  Findings: Abnormal Labs Reviewed  COMPREHENSIVE METABOLIC PANEL - Abnormal; Notable for the following components:      Result Value   CO2 15 (*)    Glucose, Bld 110 (*)    BUN 24 (*)    Creatinine, Ser 1.21 (*)  Total Bilirubin 1.3 (*)    GFR, Estimated 44 (*)    All other components within normal limits  CBC WITH DIFFERENTIAL/PLATELET - Abnormal; Notable for the following components:   WBC 11.0 (*)    Platelets 500 (*)    Neutro Abs 7.9 (*)    Abs Immature Granulocytes 0.08 (*)    All other components within normal limits  URINALYSIS, ROUTINE W REFLEX MICROSCOPIC - Abnormal; Notable for the following components:   Ketones, ur 20 (*)    Protein, ur 30 (*)    Leukocytes,Ua SMALL (*)    Bacteria, UA MANY (*)    All other components within normal limits  POC OCCULT BLOOD, ED - Abnormal; Notable for the following components:   Fecal Occult Bld POSITIVE (*)    All other components within normal limits   ____________________________________________ Ordered   CXR - PNA   ECG: Ordered Personally reviewed by me showing: HR :  113 Rhythm Sinus tachycardia   no evidence of ischemic changes QTC 662 ____________________ This patient meets SIRS Criteria    The recent clinical data is shown below. Vitals:   11/14/20 2245 11/14/20 2300 11/14/20 2312 11/14/20 2315  BP: (!)  158/82 136/86 136/86 (!) 150/91  Pulse: (!) 118 (!) 111 (!) 111 (!) 117  Resp: 20 (!) 23  (!) 23  Temp:      TempSrc:      SpO2: 98% 96%  98%  Weight:      Height:         WBC     Component Value Date/Time   WBC 11.0 (H) 11/14/2020 1723   LYMPHSABS 2.1 11/14/2020 1723   LYMPHSABS 2.1 07/25/2019 1735   MONOABS 0.8 11/14/2020 1723   EOSABS 0.1 11/14/2020 1723   EOSABS 0.3 07/25/2019 1735   BASOSABS 0.1 11/14/2020 1723   BASOSABS 0.1 07/25/2019 1735    Lactic Acid, Venous Ordered   Procalcitonin Ordered   UA  no evidence of UTI      Urine analysis:    Component Value Date/Time   COLORURINE YELLOW 11/14/2020 2100   APPEARANCEUR CLEAR 11/14/2020 2100   LABSPEC 1.020 11/14/2020 2100   PHURINE 5.0 11/14/2020 2100   GLUCOSEU NEGATIVE 11/14/2020 2100   HGBUR NEGATIVE 11/14/2020 2100   BILIRUBINUR NEGATIVE 11/14/2020 2100   BILIRUBINUR Negative 06/12/2020 1638   KETONESUR 20 (A) 11/14/2020 2100   PROTEINUR 30 (A) 11/14/2020 2100   UROBILINOGEN 0.2 06/12/2020 1638   UROBILINOGEN 0.2 09/01/2014 0300   NITRITE NEGATIVE 11/14/2020 2100   LEUKOCYTESUR SMALL (A) 11/14/2020 2100    Results for orders placed or performed during the hospital encounter of 11/14/20  Resp Panel by RT-PCR (Flu A&B, Covid) Nasopharyngeal Swab     Status: None   Collection Time: 11/14/20 10:40 PM   Specimen: Nasopharyngeal Swab; Nasopharyngeal(NP) swabs in vial transport medium  Result Value Ref Range Status   SARS Coronavirus 2 by RT PCR NEGATIVE NEGATIVE Final         Influenza A by PCR NEGATIVE NEGATIVE Final   Influenza B by PCR NEGATIVE NEGATIVE Final          _______________________________________________________ ER Provider sent msg: [facility]    Dr.JAcob   _______________________________________________ Hospitalist was called for admission for CAP, hematemesis  The following Work up has been ordered so far:  Orders Placed This Encounter  Procedures  . Urine culture  . Resp Panel by  RT-PCR (Flu A&B, Covid) Nasopharyngeal Swab  . DG Chest Port 1  View  . Comprehensive metabolic panel  . CBC with Differential  . Urinalysis, Routine w reflex microscopic  . Consult to hospitalist  . Airborne and Contact precautions  . POC occult blood, ED  . ED EKG  . Type and screen [place]     Following Medications were ordered in ER: Medications  cefTRIAXone (ROCEPHIN) 1 g in sodium chloride 0.9 % 100 mL IVPB (has no administration in time range)  doxycycline (VIBRA-TABS) tablet 100 mg (has no administration in time range)  sodium chloride 0.9 % bolus 1,000 mL (0 mLs Intravenous Stopped 11/14/20 1818)  pantoprazole (PROTONIX) 80 mg in sodium chloride 0.9 % 100 mL IVPB (0 mg Intravenous Stopped 11/14/20 1816)  sodium chloride 0.9 % bolus 500 mL (0 mLs Intravenous Stopped 11/14/20 2149)  cephALEXin (KEFLEX) capsule 500 mg (500 mg Oral Given 11/14/20 2309)  carvedilol (COREG) tablet 3.125 mg (3.125 mg Oral Given 11/14/20 2312)        Consult Orders  (From admission, onward)         Start     Ordered   11/14/20 2335  Consult to hospitalist  Once       Provider:  (Not yet assigned)  Question Answer Comment  Place call to: [facility]   Reason for Consult Admit      11/14/20 2337            OTHER Significant initial  Findings:  labs showing:    Recent Labs  Lab 11/14/20 1723  NA 138  K 3.9  CO2 15*  GLUCOSE 110*  BUN 24*  CREATININE 1.21*  CALCIUM 9.4    Cr   Up from baseline see below Lab Results  Component Value Date   CREATININE 1.21 (H) 11/14/2020   CREATININE 0.98 10/18/2020   CREATININE 1.03 (H) 10/13/2020    Recent Labs  Lab 11/14/20 1723  AST 24  ALT 14  ALKPHOS 69  BILITOT 1.3*  PROT 7.9  ALBUMIN 3.6   Lab Results  Component Value Date   CALCIUM 9.4 11/14/2020   PHOS 4.3 08/27/2020   Plt: Lab Results  Component Value Date   PLT 500 (H) 11/14/2020     COVID-19 Labs  No results for input(s): DDIMER,  FERRITIN, LDH, CRP in the last 72 hours.  Lab Results  Component Value Date   SARSCOV2NAA NEGATIVE 10/09/2020   SARSCOV2NAA NEGATIVE 01/31/2020   SARSCOV2NAA RESULT:  NEGATIVE 09/02/2019   SARSCOV2NAA RESULT:  NEGATIVE 08/22/2019      Venous  Blood Gas result:  orderd       Recent Labs  Lab 11/14/20 1723  WBC 11.0*  NEUTROABS 7.9*  HGB 12.8  HCT 38.4  MCV 89.9  PLT 500*    HG/HCT  Stable     Component Value Date/Time   HGB 12.8 11/14/2020 1723   HGB 12.0 10/18/2020 1549   HCT 38.4 11/14/2020 1723   HCT 36.3 10/18/2020 1549   MCV 89.9 11/14/2020 1723   MCV 89 10/18/2020 1549     DM  labs:  HbA1C: Recent Labs    02/09/20 1520 06/12/20 1605 10/10/20 0400  HGBA1C 6.1* 6.3* 6.7*       CBG (last 3)  No results for input(s): GLUCAP in the last 72 hours.        Cultures:    Component Value Date/Time   SDES  01/31/2020 1425    URINE, CLEAN CATCH Performed at [facility], [place] [address] [place],  [place] [address]    SPECREQUEST  01/31/2020 1425    NONE Performed at [facility], [place] [address], [place], [place] [address]    CULT >=100,000 COLONIES/mL STAPHYLOCOCCUS EPIDERMIDIS (A) 01/31/2020 1425   REPTSTATUS 02/03/2020 FINAL 01/31/2020 1425     Radiological Exams on Admission: DG Chest Port 1 View  Result Date: 11/14/2020 CLINICAL DATA:  Cough flu like symptom EXAM: PORTABLE CHEST 1 VIEW COMPARISON:  01/31/2020 FINDINGS: Left lung is clear. Focal airspace disease in the periphery of the right mid lung. Stable cardiomediastinal silhouette with aortic atherosclerosis. No pneumothorax IMPRESSION: Focal airspace disease in the periphery of the right mid lung suspicious for pneumonia. Electronically Signed   By: Donavan Foil M.D.   On: 11/14/2020 23:15   _______________________________________________________________________________________________________ Latest  Blood pressure (!) 150/91, pulse (!) 117, temperature  98.2 F (36.8 C), temperature source Oral, resp. rate (!) 23, height 5\' 3"  (1.6 m), weight 66 kg, SpO2 98 %.   Review of Systems:    Pertinent positives include:   Fatigue,  nausea, vomiting, diarrhea,  Hematemesis  non-productive cough Constitutional:  No weight loss, night sweats, Fevers, chills, weight loss  HEENT:  No headaches, Difficulty swallowing,Tooth/dental problems,Sore throat,  No sneezing, itching, ear ache, nasal congestion, post nasal drip,  Cardio-vascular:  No chest pain, Orthopnea, PND, anasarca, dizziness, palpitations.no Bilateral lower extremity swelling  GI:  No heartburn, indigestion, abdominal pain,change in bowel habits, loss of appetite, melena, blood in stool,  Resp:  no shortness of breath at rest. No dyspnea on exertion, No excess mucus, no productive cough, No, No coughing up of blood.No change in color of mucus.No wheezing. Skin:  no rash or lesions. No jaundice GU:  no dysuria, change in color of urine, no urgency or frequency. No straining to urinate.  No flank pain.  Musculoskeletal:  No joint pain or no joint swelling. No decreased range of motion. No back pain.  Psych:  No change in mood or affect. No depression or anxiety. No memory loss.  Neuro: no localizing neurological complaints, no tingling, no weakness, no double vision, no gait abnormality, no slurred speech, no confusion  All systems reviewed and apart from [place] all are negative _______________________________________________________________________________________________ Past Medical History:   Past Medical History:  Diagnosis Date  . Arthritis   . Diabetes mellitus without complication ([place])   . Hypertension   . Hypokalemia   . Pneumonia   . Shingles   . Stroke [facility])      Past Surgical History:  Procedure Laterality Date  . ABDOMINAL HYSTERECTOMY    . BREAST EXCISIONAL BIOPSY Left   . ESOPHAGOGASTRODUODENOSCOPY N/A 09/01/2014   Procedure: ESOPHAGOGASTRODUODENOSCOPY  (EGD);  Surgeon: Lear Ng, MD;  Location: [facility];  Service: Endoscopy;  Laterality: N/A;  . ESOPHAGOGASTRODUODENOSCOPY (EGD) WITH PROPOFOL N/A 10/12/2020   Procedure: ESOPHAGOGASTRODUODENOSCOPY (EGD) WITH PROPOFOL;  Surgeon: Mauri Pole, MD;  Location: [facility];  Service: Endoscopy;  Laterality: N/A;  . HIATAL HERNIA REPAIR N/A 09/04/2014   Procedure: LAPAROSCOPIC REPAIR OF HIATAL HERNIA;  Surgeon: Excell Seltzer, MD;  Location: [facility]S;  Service: General;  Laterality: N/A;  With MESH  . IR GENERIC HISTORICAL  04/10/2016   IR US GUIDE VASC ACCESS RIGHT 04/10/2016 Corrie Mckusick, DO [facility]  . IR GENERIC HISTORICAL  04/10/2016   IR ANGIOGRAM SELECTIVE EACH ADDITIONAL VESSEL 04/10/2016 Corrie Mckusick, DO [facility]  . IR GENERIC HISTORICAL  04/10/2016   IR ANGIOGRAM SELECTIVE EACH ADDITIONAL VESSEL 04/10/2016 Corrie Mckusick, DO [facility]  . IR GENERIC HISTORICAL  04/10/2016   IR EMBO TUMOR ORGAN ISCHEMIA INFARCT INC GUIDE ROADMAPPING 04/10/2016 Corrie Mckusick, DO [facility]  . IR GENERIC HISTORICAL  04/10/2016   IR ANGIOGRAM SELECTIVE EACH ADDITIONAL VESSEL 04/10/2016 Corrie Mckusick, DO [facility]  . IR GENERIC HISTORICAL  04/10/2016   IR ANGIOGRAM SELECTIVE EACH ADDITIONAL VESSEL 04/10/2016 Corrie Mckusick, DO [facility]  . IR GENERIC HISTORICAL  04/10/2016   IR RENAL SELECTIVE  UNI INC S&I MOD SED 04/10/2016 Corrie Mckusick, DO [facility]  . IR GENERIC HISTORICAL  03/06/2016   IR RADIOLOGIST EVAL & MGMT 03/06/2016 Corrie Mckusick, DO [facility]  . IR GENERIC HISTORICAL  03/26/2016   IR RADIOLOGIST EVAL & MGMT 03/26/2016 Corrie Mckusick, DO [facility]  . IR GENERIC HISTORICAL  04/29/2016   IR RADIOLOGIST EVAL & MGMT 04/29/2016 [facility]  . IR RADIOLOGIST EVAL & MGMT  11/12/2016  . IR RADIOLOGIST EVAL & MGMT  12/16/2017  . KNEE SURGERY    . TONSILLECTOMY      Social History:  Ambulatory    cane,        reports that she has never smoked. She has never used smokeless  tobacco. She reports that she does not drink alcohol and does not use drugs.     Family History:   Family History  Problem Relation Age of Onset  . Alzheimer's disease Mother   . Prostate cancer Father   . Brain cancer Brother   . Brain cancer Brother   . Pancreatic cancer Sister   . Allergic rhinitis Neg Hx   . Asthma Neg Hx   . Eczema Neg Hx   . Urticaria Neg Hx    ______________________________________________________________________________________________ Allergies: Allergies  Allergen Reactions  . Amoxicillin Diarrhea    Has patient had a PCN reaction causing immediate rash, facial/tongue/throat swelling, SOB or lightheadedness with hypotension: no Has patient had a PCN reaction causing severe rash involving mucus membranes or skin necrosis: no Has patient had a PCN reaction that required hospitalization: no pharmacist consult Has patient had a PCN reaction occurring within the last 10 years: yes If all of the above answers are "NO", then may proceed with Cephalosporin use.   . Ampicillin Rash    - Tolerates Rocephin and Ancef - Remote occurrence; no symptoms of anaphylaxis or severe cutaneous reaction, and no additional medical attention required    . Sulfa Antibiotics Rash     Prior to Admission medications   Medication Sig Start Date End Date Taking? Authorizing Provider  acetaminophen (TYLENOL) 325 MG tablet Take 2 tablets (650 mg total) by mouth every 6 (six) hours as needed for mild pain (or Fever >/= 101). 02/08/16  Yes Johnson, Clanford L, MD  amLODipine (NORVASC) 10 MG tablet TAKE 1 TABLET BY MOUTH ONCE DAILY Patient taking differently: Take 10 mg by mouth daily. 08/24/20 08/24/21 Yes Glendale Chard, MD  Ascorbic Acid (VITAMIN C) 1000 MG tablet Take 1,000 mg by mouth daily.   Yes [provider]  azelastine (ASTELIN) 0.1 % nasal spray Place 2 sprays into both nostrils daily as needed for rhinitis. 10/13/20  Yes Hongalgi, Lenis Dickinson, MD   Carboxymethylcellul-Glycerin (LUBRICATING EYE DROPS OP) Apply 1 drop to eye daily as needed (dry eyes).    Yes [provider]  carvedilol (COREG) 3.125 MG tablet Take 1 tablet (3.125 mg total) by mouth 2 (two) times daily with a meal. 11/14/20  Yes Glendale Chard, MD  Fexofenadine HCl [facility]  PO) Take 180 mg by mouth at bedtime.    Yes [provider]  pantoprazole (PROTONIX) 40 MG tablet Take 1 tablet (40 mg total) by mouth 2 (two) times daily before a meal. 11/13/20  Yes Glendale Chard, MD  sucralfate (CARAFATE) 1 GM/10ML suspension Take 10 mLs (1 g total) by mouth 4 (four) times daily -  with meals and at bedtime for 28 days. 11/13/20 12/11/20 Yes Glendale Chard, MD  trolamine salicylate (ASPERCREME) 10 % cream Apply 1 application topically daily as needed for muscle pain.   Yes [provider]  vitamin B-12 (CYANOCOBALAMIN) 100 MCG tablet Take 100 mcg by mouth daily.   Yes [provider]  benzonatate (TESSALON PERLES) 100 MG capsule Take 1 capsule (100 mg total) by mouth 3 (three) times daily as needed for cough. Patient not taking: No sig reported 12/10/19   Glendale Chard, MD  glucose blood [facility] VERIO) test strip Use as instructed to check blood sugar 2 times per day dx: e11.65 11/08/20   Glendale Chard, MD  Lancets [facility] ULTRASOFT) lancets Use as instructed to check blood sugar 2 times per day dx code e11.65 10/19/19   Glendale Chard, MD  rosuvastatin (CRESTOR) 20 MG tablet TAKE 1 TABLET BY MOUTH ONCE DAILY Patient not taking: No sig reported 08/31/20 08/31/21  Glendale Chard, MD  omeprazole (PRILOSEC) 20 MG capsule TAKE 1 CAPSULE BY MOUTH ONCE DAILY 30 MINUTES TO 1 HOUR BEFORE A MEAL Patient taking differently: Take 20 mg by mouth daily. 08/06/20 10/13/20  Glendale Chard, MD    ___________________________________________________________________________________________________ Physical Exam: Vitals with BMI 11/14/2020 11/14/2020 11/14/2020  Height - - -   Weight - - -  BMI - - -  Systolic 546 568 127  Diastolic 91 86 86  Pulse 517 111 111     1. General:  in No   Acute distress    Chronically ill   -appearing 2. Psychological: Alert and  Oriented 3. Head/ENT:   Moist Mucous Membranes                          Head Non traumatic, neck supple                            Poor Dentition 4. SKIN:  decreased Skin turgor,  Skin clean Dry and intact no rash 5. Heart: Regular rate and rhythm no  Murmur, no Rub or gallop 6. Lungs:   no wheezes some  crackles   7. Abdomen: Soft, non-tender, Non distended  bowel sounds present 8. Lower extremities: no clubbing, cyanosis, no  edema 9. Neurologically Grossly intact, moving all 4 extremities equally   10. MSK: Normal range of motion    Chart has been reviewed  ______________________________________________________________________________________________  Assessment/Plan   86 y.o. female with medical history significant of Dm2, HTN  CVA    Admitted for  CAP, hematemesis  Present on Admission: . CAP (community acquired pneumonia) - - will admit for treatment of CAP will start on appropriate antibiotic coverage.   Obtain:  sputum cultures,               blood cultures                    strep pneumo UA antigen,                  given diarrhea also check for Legionella.  Provide oxygen as needed.   . Type 2 diabetes mellitus with stage 3 chronic kidney disease, without long-term current use of insulin ([name]) -  - Order Sensitive  SSI   -  check TSH and HgA1C     . Mixed hyperlipidemia -stable continue home meds  . Hypertension -allow permissive hypertension but resume Coreg given rebound tachycardia  . Erosive esophagitis-possible cause of hematemesis.  Appreciate GI consult continue Protonix monitor CBC currently stable  . Acute renal failure superimposed on stage 3a chronic kidney disease ([place]) obtain urine electrolytes and gently hydrate  . Dehydration  rehydrate  Given gastrointestinal symptoms obtain gastric panel  Bacteria urine   follow-up urine culture Other plan as per orders.  QT prolongation - - will monitor on tele avoid QT prolonging medications, rehydrate correct electrolytes   DVT prophylaxis:  SCD     Code Status:    Code Status: Prior  DNR/DNI  as per patient   I had personally discussed CODE STATUS with patient      Family Communication:   Family not at  Bedside   Disposition Plan:      To home once workup is complete and patient is stable   Following barriers for discharge:                                                       Will need consultants to evaluate patient prior to discharge                      Would benefit from PT/OT eval prior to DC  Ordered                                       Consults called: [facility]   Admission status:  ED Disposition    ED Disposition Condition [place]: [place] [100102]  Level of Care: Progressive [102]  Admit to Progressive based on following criteria: CARDIOVASCULAR & THORACIC of moderate stability with acute coronary syndrome symptoms/low risk myocardial infarction/hypertensive urgency/arrhythmias/heart failure potentially compromising stability and stable post cardiovascular intervention patients.  Admit to Progressive based on following criteria: MULTISYSTEM THREATS such as stable sepsis, metabolic/electrolyte imbalance with or without encephalopathy that is responding to early treatment.  May admit patient to [facility] or [facility] if equivalent level of care is available:: No  Covid Evaluation: Confirmed COVID Negative  Diagnosis: CAP (community acquired pneumonia) [211941]  Admitting Physician: Toy Baker [[id number]]  Attending Physician: Toy Baker [[id number]]  Estimated length of stay: past midnight tomorrow  Certification:: I certify this patient will need inpatient services for at least 2 midnights          inpatient     I Expect 2 midnight stay secondary to severity of patient's current illness need for inpatient interventions justified by the following:  hemodynamic instability despite optimal treatment (tachycardia  )   Severe lab/radiological/exam abnormalities including:    PNA and extensive comorbidities including:      DM2    CKD    history of stroke  .    That are currently affecting medical management.   I expect  patient to be hospitalized for 2 midnights  requiring inpatient medical care.  Patient is at high risk for adverse outcome (such as loss of life or disability) if not treated.  Indication for inpatient stay as follows:    inability to maintain oral hydration    Need for operative/procedural  intervention    Need for IV antibiotics, IV fluids,     Level of care   Progressive  tele indefinitely please discontinue once patient no longer qualifies COVID-19 Labs    Lab Results  Component Value Date   [place] 11/14/2020     Precautions: admitted as   Covid Negative  PPE: Used by the provider:   N95  eye Goggles,  Gloves    Katara Griner 11/15/2020, 2:25 AM    [facility]     after 2 AM please page floor coverage PA If 7AM-7PM, please contact the day team taking care of the patient using [email or web address]   Patient was evaluated in the context of the global COVID-19 pandemic, which necessitated consideration that the patient might be at risk for infection with the SARS-CoV-2 virus that causes COVID-19. Institutional protocols and algorithms that pertain to the evaluation of patients at risk for COVID-19 are in a state of rapid change based on information released by regulatory bodies including the CDC and federal and state organizations. These policies and algorithms were followed during the patient's care.

## 2020-11-14 NOTE — H&P (Incomplete)
Anna Hoffman WIO:[id number] DOB: 06-14-1935 DOA: 11/14/2020     PCP: Glendale Chard, MD   Outpatient Specialists: * NONE CARDS: * Dr. NEphrology: *  Dr. NEurology *   Dr. Pulmonary *  Dr.  Oncology * Dr. Fabienne Bruns* Dr.  Sadie Haber, [facility]) Urology Dr. *  Patient arrived to ER on 11/14/20 at 1659 Referred by Attending Valarie Merino, MD   Patient coming from: home Lives alone,   *** With family From facility ***  Chief Complaint: *** Chief Complaint  Patient presents with  . Weakness  . Emesis    HPI: Anna Hoffman is a 86 y.o. female with medical history significant of ***     Presented with   *    Infectious risk factors:  Reports*** fever, shortness of breath, dry cough, chest pain, Sore throat, URI symptoms, anosmia/change in taste, N/V/Diarrhea/abdominal pain,  Body aches, severe fatigue Reports known sick contacts, known COVID 19 exposure, inability to completely isolate   ***KNOWN COVID POSITIVE   Has ***NOt been vaccinated against COVID **and boosted   Initial COVID TEST  NEGATIVE**** POSITIVE,  ***in house  PCR testing  Pending  Lab Results  Component Value Date   SARSCOV2NAA NEGATIVE 10/09/2020   SARSCOV2NAA NEGATIVE 01/31/2020   SARSCOV2NAA RESULT:  NEGATIVE 09/02/2019   SARSCOV2NAA RESULT:  NEGATIVE 08/22/2019     Regarding pertinent Chronic problems: ***  ****Hyperlipidemia - *on statins Lipid Panel     Component Value Date/Time   CHOL 150 10/18/2020 1549   TRIG 153 (H) 10/18/2020 1549   HDL 74 10/18/2020 1549   CHOLHDL 2.0 10/18/2020 1549   CHOLHDL 3.5 06/29/2016 0330   VLDL 36 06/29/2016 0330   LDLCALC 51 10/18/2020 1549   LABVLDL 25 10/18/2020 1549    ***HTN on   ***chronic CHF diastolic/systolic/ combined - last echo***  *** CAD  - On Aspirin, statin, betablocker, Plavix                 - *followed by cardiology                - last cardiac cath  The ASCVD Risk score ([place] et al., 2013) failed to calculate for the  following reasons:   The 2013 ASCVD risk score is only valid for ages 75 to 22   The patient has a prior MI or stroke diagnosis    ***DM 2 -  Lab Results  Component Value Date   HGBA1C 6.7 (H) 10/10/2020   ****on insulin, PO meds only, diet controlled  ***Hypothyroidism:  Lab Results  Component Value Date   TSH 1.050 03/24/2019   on synthroid  *** Morbid obesity-   BMI Readings from Last 1 Encounters:  11/14/20 25.77 kg/m     *** Asthma -well *** controlled on home inhalers/ nebs f                        ***last no prior***admission  ***                       No ***history of intubation  *** COPD - not **followed by pulmonology *** not  on baseline oxygen  *L,    *** OSA -on nocturnal oxygen, *CPAP, *noncompliant with CPAP  *** Hx of CVA - *with/out residual deficits on Aspirin 81 mg, 325, Plavix  ***A. Fib -  - CHA2DS2 vas score **** CHA2DS2/VAS Stroke Risk Points  N/A >= 2 Points: High Risk  1 - 1.99 Points: Medium Risk  0 Points: Low Risk    Last Change: N/A      This score determines the patient's risk of having a stroke if the  patient has atrial fibrillation.      This score is not applicable to this patient. Components are not  calculated.     current  on anticoagulation with ****Coumadin  ***Xarelto,* Eliquis,  *** Not on anticoagulation secondary to Risk of Falls, *** recurrent bleeding         -  Rate control:  Currently controlled with ***Toprolol,  *Metoprolol,* Diltiazem, *Coreg          - Rhythm control: *** amiodarone, *flecainide  ***Hx of DVT/PE on - anticoagulation with ****Coumadin  ***Xarelto,* Eliquis,   ***CKD stage III*- baseline Cr **** Estimated Creatinine Clearance: 32.1 mL/min (A) (by C-G formula based on SCr of 1.21 mg/dL (H)).  Lab Results  Component Value Date   CREATININE 1.21 (H) 11/14/2020   CREATININE 0.98 10/18/2020   CREATININE 1.03 (H) 10/13/2020     **** Liver disease Computed MELD-Na score unavailable. Necessary  lab results were not found in the last year. Computed MELD score unavailable. Necessary lab results were not found in the last year.   ***BPH - on Flomax, Proscar    *** Dementia - on Aricept** Nemenda  *** Chronic anemia - baseline hg Hemoglobin & Hematocrit  Recent Labs    10/11/20 0425 10/18/20 1549 11/14/20 1723  HGB 12.5 12.0 12.8     While in ER:      ED Triage Vitals  Enc Vitals Group     BP 11/14/20 1706 (!) 148/80     Pulse Rate 11/14/20 1706 (!) 118     Resp 11/14/20 1706 18     Temp 11/14/20 1710 98.2 F (36.8 C)     Temp Source 11/14/20 1710 Oral     SpO2 11/14/20 1706 96 %     Weight 11/14/20 1705 145 lb 8.1 oz (66 kg)     Height 11/14/20 1705 5\' 3"  (1.6 m)     Head Circumference --      Peak Flow --      Pain Score 11/14/20 1705 0     Pain Loc --      Pain Edu? --      Excl. in [place]? --   TMAX(24)@     _________________________________________ Significant initial  Findings: Abnormal Labs Reviewed  COMPREHENSIVE METABOLIC PANEL - Abnormal; Notable for the following components:      Result Value   CO2 15 (*)    Glucose, Bld 110 (*)    BUN 24 (*)    Creatinine, Ser 1.21 (*)    Total Bilirubin 1.3 (*)    GFR, Estimated 44 (*)    All other components within normal limits  CBC WITH DIFFERENTIAL/PLATELET - Abnormal; Notable for the following components:   WBC 11.0 (*)    Platelets 500 (*)    Neutro Abs 7.9 (*)    Abs Immature Granulocytes 0.08 (*)    All other components within normal limits  URINALYSIS, ROUTINE W REFLEX MICROSCOPIC - Abnormal; Notable for the following components:   Ketones, ur 20 (*)    Protein, ur 30 (*)    Leukocytes,Ua SMALL (*)    Bacteria, UA MANY (*)    All other components within normal limits  POC OCCULT BLOOD, ED - Abnormal; Notable for the following components:  Fecal Occult Bld POSITIVE (*)    All other components within normal limits   ____________________________________________ Ordered CT HEAD *** NON  acute  CXR - ***NON acute  CTabd/pelvis - ***nonacute  CTA chest - ***nonacute, no PE, * no evidence of infiltrate   _________________________ Troponin ***ordered ECG: Ordered Personally reviewed by me showing: HR : *** Rhythm: *NSR, Sinus tachycardia * A.fib. W RVR, RBBB, LBBB, Paced Ischemic changes*nonspecific changes, no evidence of ischemic changes QTC* ____________________ This patient meets SIRS Criteria and may be septic. SIRS = Systemic Inflammatory Response Syndrome  Order a lactic acid level if needed AND/OR Initiate the sepsis protocol with the attached order set OR Click "Treating Associated Infection or Illness" if the patient is being treated for an infection that is a known cause of these abnormalities     The recent clinical data is shown below. Vitals:   11/14/20 2245 11/14/20 2300 11/14/20 2312 11/14/20 2315  BP: (!) 158/82 136/86 136/86 (!) 150/91  Pulse: (!) 118 (!) 111 (!) 111 (!) 117  Resp: 20 (!) 23  (!) 23  Temp:      TempSrc:      SpO2: 98% 96%  98%  Weight:      Height:            WBC     Component Value Date/Time   WBC 11.0 (H) 11/14/2020 1723   LYMPHSABS 2.1 11/14/2020 1723   LYMPHSABS 2.1 07/25/2019 1735   MONOABS 0.8 11/14/2020 1723   EOSABS 0.1 11/14/2020 1723   EOSABS 0.3 07/25/2019 1735   BASOSABS 0.1 11/14/2020 1723   BASOSABS 0.1 07/25/2019 1735        Lactic Acid, Venous    Component Value Date/Time   LATICACIDVEN 1.2 08/11/2019 1402     Procalcitonin *** Ordered Lactic Acid, Venous    Component Value Date/Time   LATICACIDVEN 1.2 08/11/2019 1402     Procalcitonin *** Ordered   UA *** no evidence of UTI  ***Pending ***not ordered   Urine analysis:    Component Value Date/Time   COLORURINE YELLOW 11/14/2020 2100   APPEARANCEUR CLEAR 11/14/2020 2100   LABSPEC 1.020 11/14/2020 2100   [place] 5.0 11/14/2020 2100   [place] 11/14/2020 2100   HGBUR NEGATIVE 11/14/2020 2100   BILIRUBINUR  NEGATIVE 11/14/2020 2100   BILIRUBINUR Negative 06/12/2020 1638   KETONESUR 20 (A) 11/14/2020 2100   PROTEINUR 30 (A) 11/14/2020 2100   UROBILINOGEN 0.2 06/12/2020 1638   UROBILINOGEN 0.2 09/01/2014 0300   NITRITE NEGATIVE 11/14/2020 2100   LEUKOCYTESUR SMALL (A) 11/14/2020 2100    Results for orders placed or performed during the hospital encounter of 10/09/20  Resp Panel by RT-PCR (Flu A&B, Covid) Nasopharyngeal Swab     Status: None   Collection Time: 10/09/20  3:32 PM   Specimen: Nasopharyngeal Swab; Nasopharyngeal(NP) swabs in vial transport medium  Result Value Ref Range Status   SARS Coronavirus 2 by RT PCR NEGATIVE NEGATIVE Final    Comment: (NOTE) SARS-CoV-2 target nucleic acids are NOT DETECTED.  The SARS-CoV-2 RNA is generally detectable in upper respiratory specimens during the acute phase of infection. The lowest concentration of SARS-CoV-2 viral copies this assay can detect is 138 copies/mL. A negative result does not preclude SARS-Cov-2 infection and should not be used as the sole basis for treatment or other patient management decisions. A negative result may occur with  improper specimen collection/handling, submission of specimen other than nasopharyngeal swab, presence of viral mutation(s) within the areas targeted  by this assay, and inadequate number of viral copies(<138 copies/mL). A negative result must be combined with clinical observations, patient history, and epidemiological information. The expected result is Negative.  Fact Sheet for Patients:  EntrepreneurPulse.com.au  Fact Sheet for Healthcare Providers:  IncredibleEmployment.be  This test is no t yet approved or cleared by the [place] FDA and  has been authorized for detection and/or diagnosis of SARS-CoV-2 by FDA under an Emergency Use Authorization (EUA). This EUA will remain  in effect (meaning this test can be used) for the duration of the COVID-19  declaration under Section 564(b)(1) of the Act, 21 U.S.C.section 360bbb-3(b)(1), unless the authorization is terminated  or revoked sooner.       Influenza A by PCR NEGATIVE NEGATIVE Final   Influenza B by PCR NEGATIVE NEGATIVE Final    Comment: (NOTE) The Xpert Xpress SARS-CoV-2/FLU/RSV plus assay is intended as an aid in the diagnosis of influenza from Nasopharyngeal swab specimens and should not be used as a sole basis for treatment. Nasal washings and aspirates are unacceptable for Xpert Xpress SARS-CoV-2/FLU/RSV testing.  Fact Sheet for Patients: EntrepreneurPulse.com.au  Fact Sheet for Healthcare Providers: IncredibleEmployment.be  This test is not yet approved or cleared by the [place] FDA and has been authorized for detection and/or diagnosis of SARS-CoV-2 by FDA under an Emergency Use Authorization (EUA). This EUA will remain in effect (meaning this test can be used) for the duration of the COVID-19 declaration under Section 564(b)(1) of the Act, 21 U.S.C. section 360bbb-3(b)(1), unless the authorization is terminated or revoked.  Performed at [facility], [place] [address], [place], [place] [address]   MRSA PCR Screening     Status: None   Collection Time: 10/12/20  9:15 AM   Specimen: Nasal Mucosa; Nasopharyngeal  Result Value Ref Range Status   MRSA by PCR NEGATIVE NEGATIVE Final    Comment:        The GeneXpert MRSA Assay (FDA approved for NASAL specimens only), is one component of a comprehensive MRSA colonization surveillance program. It is not intended to diagnose MRSA infection nor to guide or monitor treatment for MRSA infections. Performed at [facility], [place] [address], [place],  [address]      _______________________________________________________ ER Provider Called:     DrMarland Kitchen  They Recommend admit to medicine *** Will see in AM  ***SEEN in  ER _______________________________________________ Hospitalist was called for admission for ***  The following Work up has been ordered so far:  Orders Placed This Encounter  Procedures  . Urine culture  . Resp Panel by RT-PCR (Flu A&B, Covid) Nasopharyngeal Swab  . DG Chest Port 1 View  . Comprehensive metabolic panel  . CBC with Differential  . Urinalysis, Routine w reflex microscopic  . Consult to hospitalist  . Airborne and Contact precautions  . POC occult blood, ED  . ED EKG  . Type and screen [place]      Following Medications were ordered in ER: Medications  cefTRIAXone (ROCEPHIN) 1 g in sodium chloride 0.9 % 100 mL IVPB (has no administration in time range)  doxycycline (VIBRA-TABS) tablet 100 mg (has no administration in time range)  sodium chloride 0.9 % bolus 1,000 mL (0 mLs Intravenous Stopped 11/14/20 1818)  pantoprazole (PROTONIX) 80 mg in sodium chloride 0.9 % 100 mL IVPB (0 mg Intravenous Stopped 11/14/20 1816)  sodium chloride 0.9 % bolus 500 mL (0 mLs Intravenous Stopped 11/14/20 2149)  cephALEXin (KEFLEX) capsule 500  mg (500 mg Oral Given 11/14/20 2309)  carvedilol (COREG) tablet 3.125 mg (3.125 mg Oral Given 11/14/20 2312)        Consult Orders  (From admission, onward)         Start     Ordered   11/14/20 2335  Consult to hospitalist  Once       Provider:  (Not yet assigned)  Question Answer Comment  Place call to: [facility]   Reason for Consult Admit      11/14/20 2337            OTHER Significant initial  Findings:  labs showing:    Recent Labs  Lab 11/14/20 1723  NA 138  K 3.9  CO2 15*  GLUCOSE 110*  BUN 24*  CREATININE 1.21*  CALCIUM 9.4    Cr  * stable,  Up from baseline see below Lab Results  Component Value Date   CREATININE 1.21 (H) 11/14/2020   CREATININE 0.98 10/18/2020   CREATININE 1.03 (H) 10/13/2020    Recent Labs  Lab 11/14/20 1723  AST 24  ALT 14  ALKPHOS 69  BILITOT  1.3*  PROT 7.9  ALBUMIN 3.6   Lab Results  Component Value Date   CALCIUM 9.4 11/14/2020   PHOS 4.3 08/27/2020          Plt: Lab Results  Component Value Date   PLT 500 (H) 11/14/2020       COVID-19 Labs  No results for input(s): DDIMER, FERRITIN, LDH, CRP in the last 72 hours.  Lab Results  Component Value Date   SARSCOV2NAA NEGATIVE 10/09/2020   SARSCOV2NAA NEGATIVE 01/31/2020   SARSCOV2NAA RESULT:  NEGATIVE 09/02/2019   SARSCOV2NAA RESULT:  NEGATIVE 08/22/2019     Arterial ***Venous  Blood Gas result:  pH *** pCO2 ***; pO2 ***;     %O2 Sat ***.  ABG    Component Value Date/Time   PHART 7.374 10/11/2020 1703   PCO2ART 24.0 (L) 10/11/2020 1703   PO2ART 84.5 10/11/2020 1703   HCO3 13.7 (L) 10/11/2020 1703   TCO2 27 11/07/2017 2042   ACIDBASEDEF 9.7 (H) 10/11/2020 1703   O2SAT 96.4 10/11/2020 1703         Recent Labs  Lab 11/14/20 1723  WBC 11.0*  NEUTROABS 7.9*  HGB 12.8  HCT 38.4  MCV 89.9  PLT 500*    HG/HCT * stable,  Down *Up from baseline see below    Component Value Date/Time   HGB 12.8 11/14/2020 1723   HGB 12.0 10/18/2020 1549   HCT 38.4 11/14/2020 1723   HCT 36.3 10/18/2020 1549   MCV 89.9 11/14/2020 1723   MCV 89 10/18/2020 1549      No results for input(s): LIPASE, AMYLASE in the last 168 hours. No results for input(s): AMMONIA in the last 168 hours.   Cardiac Panel (last 3 results) No results for input(s): CKTOTAL, CKMB, TROPONINI, RELINDX in the last 72 hours.   BNP (last 3 results) No results for input(s): BNP in the last 8760 hours.    DM  labs:  HbA1C: Recent Labs    02/09/20 1520 06/12/20 1605 10/10/20 0400  HGBA1C 6.1* 6.3* 6.7*       CBG (last 3)  No results for input(s): GLUCAP in the last 72 hours.        Cultures:    Component Value Date/Time   SDES  01/31/2020 1425    URINE, CLEAN CATCH Performed at [facility], [place]  [address] [place], [place] [address]    SPECREQUEST   01/31/2020 1425    NONE Performed at [facility], [place] [address] [place], [place] [address]    CULT >=100,000 COLONIES/mL STAPHYLOCOCCUS EPIDERMIDIS (A) 01/31/2020 1425   REPTSTATUS 02/03/2020 FINAL 01/31/2020 1425     Radiological Exams on Admission: DG Chest Port 1 View  Result Date: 11/14/2020 CLINICAL DATA:  Cough flu like symptom EXAM: PORTABLE CHEST 1 VIEW COMPARISON:  01/31/2020 FINDINGS: Left lung is clear. Focal airspace disease in the periphery of the right mid lung. Stable cardiomediastinal silhouette with aortic atherosclerosis. No pneumothorax IMPRESSION: Focal airspace disease in the periphery of the right mid lung suspicious for pneumonia. Electronically Signed   By: Donavan Foil M.D.   On: 11/14/2020 23:15   _______________________________________________________________________________________________________ Latest  Blood pressure (!) 150/91, pulse (!) 117, temperature 98.2 F (36.8 C), temperature source Oral, resp. rate (!) 23, height 5\' 3"  (1.6 m), weight 66 kg, SpO2 98 %.   Review of Systems:    Pertinent positives include: ***  Constitutional:  No weight loss, night sweats, Fevers, chills, fatigue, weight loss  HEENT:  No headaches, Difficulty swallowing,Tooth/dental problems,Sore throat,  No sneezing, itching, ear ache, nasal congestion, post nasal drip,  Cardio-vascular:  No chest pain, Orthopnea, PND, anasarca, dizziness, palpitations.no Bilateral lower extremity swelling  GI:  No heartburn, indigestion, abdominal pain, nausea, vomiting, diarrhea, change in bowel habits, loss of appetite, melena, blood in stool, hematemesis Resp:  no shortness of breath at rest. No dyspnea on exertion, No excess mucus, no productive cough, No non-productive cough, No coughing up of blood.No change in color of mucus.No wheezing. Skin:  no rash or lesions. No jaundice GU:  no dysuria, change in color of urine, no urgency or frequency. No straining to  urinate.  No flank pain.  Musculoskeletal:  No joint pain or no joint swelling. No decreased range of motion. No back pain.  Psych:  No change in mood or affect. No depression or anxiety. No memory loss.  Neuro: no localizing neurological complaints, no tingling, no weakness, no double vision, no gait abnormality, no slurred speech, no confusion  All systems reviewed and apart from [place] all are negative _______________________________________________________________________________________________ Past Medical History:   Past Medical History:  Diagnosis Date  . Arthritis   . Diabetes mellitus without complication ([place])   . Hypertension   . Hypokalemia   . Pneumonia   . Shingles   . Stroke [facility])       Past Surgical History:  Procedure Laterality Date  . ABDOMINAL HYSTERECTOMY    . BREAST EXCISIONAL BIOPSY Left   . ESOPHAGOGASTRODUODENOSCOPY N/A 09/01/2014   Procedure: ESOPHAGOGASTRODUODENOSCOPY (EGD);  Surgeon: Lear Ng, MD;  Location: [facility];  Service: Endoscopy;  Laterality: N/A;  . ESOPHAGOGASTRODUODENOSCOPY (EGD) WITH PROPOFOL N/A 10/12/2020   Procedure: ESOPHAGOGASTRODUODENOSCOPY (EGD) WITH PROPOFOL;  Surgeon: Mauri Pole, MD;  Location: [facility];  Service: Endoscopy;  Laterality: N/A;  . HIATAL HERNIA REPAIR N/A 09/04/2014   Procedure: LAPAROSCOPIC REPAIR OF HIATAL HERNIA;  Surgeon: Excell Seltzer, MD;  Location: [facility];  Service: General;  Laterality: N/A;  With MESH  . IR GENERIC HISTORICAL  04/10/2016   IR US GUIDE VASC ACCESS RIGHT 04/10/2016 Corrie Mckusick, DO [facility]  . IR GENERIC HISTORICAL  04/10/2016   IR ANGIOGRAM SELECTIVE EACH ADDITIONAL VESSEL 04/10/2016 Corrie Mckusick, DO [facility]  . IR GENERIC HISTORICAL  04/10/2016   IR ANGIOGRAM SELECTIVE EACH ADDITIONAL VESSEL 04/10/2016 Corrie Mckusick, DO  [facility]  . IR GENERIC HISTORICAL  04/10/2016   IR EMBO TUMOR ORGAN ISCHEMIA INFARCT INC GUIDE ROADMAPPING 04/10/2016 Corrie Mckusick, DO [facility]  . IR GENERIC HISTORICAL  04/10/2016   IR ANGIOGRAM SELECTIVE EACH ADDITIONAL VESSEL 04/10/2016 Corrie Mckusick, DO [facility]  . IR GENERIC HISTORICAL  04/10/2016   IR ANGIOGRAM SELECTIVE EACH ADDITIONAL VESSEL 04/10/2016 Corrie Mckusick, DO [facility]  . IR GENERIC HISTORICAL  04/10/2016   IR RENAL SELECTIVE  UNI INC S&I MOD SED 04/10/2016 Corrie Mckusick, DO [facility]  . IR GENERIC HISTORICAL  03/06/2016   IR RADIOLOGIST EVAL & MGMT 03/06/2016 Corrie Mckusick, DO [facility]  . IR GENERIC HISTORICAL  03/26/2016   IR RADIOLOGIST EVAL & MGMT 03/26/2016 Corrie Mckusick, DO [facility]  . IR GENERIC HISTORICAL  04/29/2016   IR RADIOLOGIST EVAL & MGMT 04/29/2016 [facility]  . IR RADIOLOGIST EVAL & MGMT  11/12/2016  . IR RADIOLOGIST EVAL & MGMT  12/16/2017  . KNEE SURGERY    . TONSILLECTOMY      Social History:  Ambulatory *** independently cane, walker  wheelchair bound, bed bound     reports that she has never smoked. She has never used smokeless tobacco. She reports that she does not drink alcohol and does not use drugs.     Family History: *** Family History  Problem Relation Age of Onset  . Alzheimer's disease Mother   . Prostate cancer Father   . Brain cancer Brother   . Brain cancer Brother   . Pancreatic cancer Sister   . Allergic rhinitis Neg Hx   . Asthma Neg Hx   . Eczema Neg Hx   . Urticaria Neg Hx    ______________________________________________________________________________________________ Allergies: Allergies  Allergen Reactions  . Amoxicillin Diarrhea    Has patient had a PCN reaction causing immediate rash, facial/tongue/throat swelling, SOB or lightheadedness with hypotension: no Has patient had a PCN reaction causing severe rash involving mucus membranes or skin necrosis: no Has patient had a PCN reaction that required hospitalization: no pharmacist consult Has patient had a PCN reaction occurring within the last 10 years: yes If all of the above  answers are "NO", then may proceed with Cephalosporin use.   . Ampicillin Rash    - Tolerates Rocephin and Ancef - Remote occurrence; no symptoms of anaphylaxis or severe cutaneous reaction, and no additional medical attention required    . Sulfa Antibiotics Rash     Prior to Admission medications   Medication Sig Start Date End Date Taking? Authorizing Provider  acetaminophen (TYLENOL) 325 MG tablet Take 2 tablets (650 mg total) by mouth every 6 (six) hours as needed for mild pain (or Fever >/= 101). 02/08/16  Yes Johnson, Clanford L, MD  amLODipine (NORVASC) 10 MG tablet TAKE 1 TABLET BY MOUTH ONCE DAILY Patient taking differently: Take 10 mg by mouth daily. 08/24/20 08/24/21 Yes Glendale Chard, MD  Ascorbic Acid (VITAMIN C) 1000 MG tablet Take 1,000 mg by mouth daily.   Yes [provider]  azelastine (ASTELIN) 0.1 % nasal spray Place 2 sprays into both nostrils daily as needed for rhinitis. 10/13/20  Yes Hongalgi, Lenis Dickinson, MD  Carboxymethylcellul-Glycerin (LUBRICATING EYE DROPS OP) Apply 1 drop to eye daily as needed (dry eyes).    Yes [provider]  carvedilol (COREG) 3.125 MG tablet Take 1 tablet (3.125 mg total) by mouth 2 (two) times daily with a meal. 11/14/20  Yes Glendale Chard, MD  Fexofenadine  HCl (ALLEGRA PO) Take 180 mg by mouth at bedtime.    Yes [provider]  pantoprazole (PROTONIX) 40 MG tablet Take 1 tablet (40 mg total) by mouth 2 (two) times daily before a meal. 11/13/20  Yes Glendale Chard, MD  sucralfate (CARAFATE) 1 GM/10ML suspension Take 10 mLs (1 g total) by mouth 4 (four) times daily -  with meals and at bedtime for 28 days. 11/13/20 12/11/20 Yes Glendale Chard, MD  trolamine salicylate (ASPERCREME) 10 % cream Apply 1 application topically daily as needed for muscle pain.   Yes [provider]  vitamin B-12 (CYANOCOBALAMIN) 100 MCG tablet Take 100 mcg by mouth daily.   Yes [provider]  benzonatate (TESSALON PERLES) 100  MG capsule Take 1 capsule (100 mg total) by mouth 3 (three) times daily as needed for cough. Patient not taking: No sig reported 12/10/19   Glendale Chard, MD  glucose blood [facility] VERIO) test strip Use as instructed to check blood sugar 2 times per day dx: e11.65 11/08/20   Glendale Chard, MD  Lancets [facility] ULTRASOFT) lancets Use as instructed to check blood sugar 2 times per day dx code e11.65 10/19/19   Glendale Chard, MD  rosuvastatin (CRESTOR) 20 MG tablet TAKE 1 TABLET BY MOUTH ONCE DAILY Patient not taking: No sig reported 08/31/20 08/31/21  Glendale Chard, MD  omeprazole (PRILOSEC) 20 MG capsule TAKE 1 CAPSULE BY MOUTH ONCE DAILY 30 MINUTES TO 1 HOUR BEFORE A MEAL Patient taking differently: Take 20 mg by mouth daily. 08/06/20 10/13/20  Glendale Chard, MD    ___________________________________________________________________________________________________ Physical Exam: Vitals with BMI 11/14/2020 11/14/2020 11/14/2020  Height - - -  Weight - - -  BMI - - -  Systolic 696 295 284  Diastolic 91 86 86  Pulse 132 111 111     1. General:  in No ***Acute distress***increased work of breathing ***complaining of severe pain****agitated * Chronically ill *well *cachectic *toxic acutely ill -appearing 2. Psychological: Alert and *** Oriented 3. Head/ENT:   Moist *** Dry Mucous Membranes                          Head Non traumatic, neck supple                          Normal *** Poor Dentition 4. SKIN: normal *** decreased Skin turgor,  Skin clean Dry and intact no rash 5. Heart: Regular rate and rhythm no*** Murmur, no Rub or gallop 6. Lungs: ***Clear to auscultation bilaterally, no wheezes or crackles   7. Abdomen: Soft, ***non-tender, Non distended *** obese ***bowel sounds present 8. Lower extremities: no clubbing, cyanosis, no ***edema 9. Neurologically Grossly intact, moving all 4 extremities equally *** strength 5 out of 5 in all 4 extremities cranial nerves II through XII intact 10.  MSK: Normal range of motion    Chart has been reviewed  ______________________________________________________________________________________________  Assessment/Plan  ***  Admitted for ***  Present on Admission: **None**   Other plan as per orders.  DVT prophylaxis:  SCD *** Lovenox       Code Status:    Code Status: Prior FULL CODE *** DNR/DNI ***comfort care as per patient ***family  I had personally discussed CODE STATUS with patient and family* I had spent *min discussing goals of care and CODE STATUS    Family Communication:   Family not at  Bedside  plan of care was  discussed on the phone with *** Son, Daughter, Wife, Husband, Sister, Brother , father, mother  Disposition Plan:   *** likely will need placement for rehabilitation                          Back to current facility when stable                            To home once workup is complete and patient is stable  ***Following barriers for discharge:                            Electrolytes corrected                               Anemia corrected                             Pain controlled with PO medications                               Afebrile, white count improving able to transition to PO antibiotics                             Will need to be able to tolerate PO                            Will likely need home health, home O2, set up                           Will need consultants to evaluate patient prior to discharge  ****EXPECT DC tomorrow                    ***Would benefit from PT/OT eval prior to DC  Ordered                   Swallow eval - SLP ordered                   Diabetes care coordinator                   Transition of care consulted                   Nutrition    consulted                  Wound care  consulted                   Palliative care    consulted                   Behavioral health  consulted                    Consults called: ***    Admission status:  ED  Disposition    ED Disposition Condition Comment   Admit  The patient appears reasonably stabilized for admission considering the current resources, flow, and capabilities available in the ED at this time, and I doubt any other [facility] requiring  further screening and/or treatment in the ED prior to admission is  present.        Obs***  ***  inpatient     I Expect 2 midnight stay secondary to severity of patient's current illness need for inpatient interventions justified by the following: ***hemodynamic instability despite optimal treatment (tachycardia *hypotension * tachypnea *hypoxia, hypercapnia) * Severe lab/radiological/exam abnormalities including:     and extensive comorbidities including: *substance abuse  *Chronic pain *DM2  * CHF * CAD  * COPD/asthma *Morbid Obesity * CKD *dementia *liver disease *history of stroke with residual deficits *  malignancy, * sickle cell disease  . History of amputation . Chronic anticoagulation  That are currently affecting medical management.   I expect  patient to be hospitalized for 2 midnights requiring inpatient medical care.  Patient is at high risk for adverse outcome (such as loss of life or disability) if not treated.  Indication for inpatient stay as follows:  Severe change from baseline regarding mental status Hemodynamic instability despite maximal medical therapy,  ongoing suicidal ideations,  severe pain requiring acute inpatient management,  inability to maintain oral hydration   persistent chest pain despite medical management Need for operative/procedural  intervention New or worsening hypoxia  Need for IV antibiotics, IV fluids, IV rate controling medications, IV antihypertensives, IV pain medications, IV anticoagulation    Level of care   *** tele  For 12H 24H     medical floor       SDU tele indefinitely please discontinue once patient no longer qualifies COVID-19 Labs    Lab Results  Component Value  Date   [facility] NEGATIVE 10/09/2020     Precautions: admitted as *** Covid Negative  ***asymptomatic screening protocol****PUI *** covid positive Airborne and Contact precautions ***If Covid PCR is negative  - please DC precautions - would need additional investigation given very high risk for false native test result   PPE: Used by the provider:   N95  eye Goggles,  Gloves    Anastassia Doutova 11/14/2020, 11:49 PM ***  [facility]     after 2 AM please page floor coverage PA If 7AM-7PM, please contact the day team taking care of the patient using [email or web address]   Patient was evaluated in the context of the global COVID-19 pandemic, which necessitated consideration that the patient might be at risk for infection with the SARS-CoV-2 virus that causes COVID-19. Institutional protocols and algorithms that pertain to the evaluation of patients at risk for COVID-19 are in a state of rapid change based on information released by regulatory bodies including the CDC and federal and state organizations. These policies and algorithms were followed during the patient's care.

## 2020-11-14 NOTE — ED Provider Notes (Incomplete)
[place] DEPT Provider Note   CSN: [id number] Arrival date & time: 11/14/20  1659     History Chief Complaint  Patient presents with  . Weakness  . Emesis    Anna Hoffman is a 86 y.o. female.  Anna Hoffman is a 86 y.o. female with a history of hypertension, diabetes, stroke, arthritis, erosive esophagitis, who presents to the emergency department        Past Medical History:  Diagnosis Date  . Arthritis   . Diabetes mellitus without complication ([place])   . Hypertension   . Hypokalemia   . Pneumonia   . Shingles   . Stroke [facility])     Patient Active Problem List   Diagnosis Date Noted  . Erosive esophagitis   . Hiatal hernia   . Hyponatremia 10/10/2020  . Acute kidney injury superimposed on chronic kidney disease ([place]) 10/10/2020  . Aortic atherosclerosis ([place]) 10/09/2020  . Acute renal failure superimposed on stage 3a chronic kidney disease ([place]) 10/09/2020  . Hyperkalemia 10/09/2020  . Hearing loss 10/09/2020  . Prolonged QT interval 10/09/2020  . Colitis, acute 08/11/2019  . Colitis 07/11/2019  . Loose stools 06/07/2019  . Herpes zoster without complication 90/30/0923  . Other long term (current) drug therapy 08/12/2018  . Chronic renal disease, stage III ([place]) 07/19/2018  . Hypertensive nephropathy 07/19/2018  . Community acquired pneumonia of right upper lobe of lung 11/07/2017  . Hypertensive emergency 07/01/2016  . 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 Benign essential HTN   . Type 2 diabetes mellitus with stage 3 chronic kidney disease, without long-term current use of insulin ([place])   . Mixed hyperlipidemia   . ICH (intracerebral hemorrhage) ([place]) - hypertensive R thalamic hemorrhage  06/27/2016  . 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 Retroperitoneal bleed 02/08/2016  . Generalized abdominal pain   . Gastroenteritis 02/04/2016  . Diarrhea 02/04/2016  .  Hypokalemia 02/04/2016  . Incarcerated paraesophageal hernia 09/02/2014  . Renal mass, left 09/02/2014  . Acute esophagitis 09/02/2014  . Gastric outlet obstruction 09/02/2014  . Hypertension   . Vomiting 09/01/2014    Past Surgical History:  Procedure Laterality Date  . ABDOMINAL HYSTERECTOMY    . BREAST EXCISIONAL BIOPSY Left   . ESOPHAGOGASTRODUODENOSCOPY N/A 09/01/2014   Procedure: ESOPHAGOGASTRODUODENOSCOPY (EGD);  Surgeon: Lear Ng, MD;  Location: [facility];  Service: Endoscopy;  Laterality: N/A;  . ESOPHAGOGASTRODUODENOSCOPY (EGD) WITH PROPOFOL N/A 10/12/2020   Procedure: ESOPHAGOGASTRODUODENOSCOPY (EGD) WITH PROPOFOL;  Surgeon: Mauri Pole, MD;  Location: [facility];  Service: Endoscopy;  Laterality: N/A;  . HIATAL HERNIA REPAIR N/A 09/04/2014   Procedure: LAPAROSCOPIC REPAIR OF HIATAL HERNIA;  Surgeon: Excell Seltzer, MD;  Location: [facility]S;  Service: General;  Laterality: N/A;  With MESH  . IR GENERIC HISTORICAL  04/10/2016   IR US GUIDE VASC ACCESS RIGHT 04/10/2016 Corrie Mckusick, DO [facility]  . IR GENERIC HISTORICAL  04/10/2016   IR ANGIOGRAM SELECTIVE EACH ADDITIONAL VESSEL 04/10/2016 Corrie Mckusick, DO [facility]  . IR GENERIC HISTORICAL  04/10/2016   IR ANGIOGRAM SELECTIVE EACH ADDITIONAL VESSEL 04/10/2016 Corrie Mckusick, DO [facility]  . IR GENERIC HISTORICAL  04/10/2016   IR EMBO TUMOR ORGAN ISCHEMIA INFARCT INC GUIDE ROADMAPPING 04/10/2016 Corrie Mckusick, DO [facility]  . IR GENERIC HISTORICAL  04/10/2016   IR ANGIOGRAM SELECTIVE EACH ADDITIONAL VESSEL 04/10/2016 Corrie Mckusick, DO [facility]  . IR GENERIC HISTORICAL  04/10/2016  IR ANGIOGRAM SELECTIVE EACH ADDITIONAL VESSEL 04/10/2016 Corrie Mckusick, DO [facility]  . IR GENERIC HISTORICAL  04/10/2016   IR RENAL SELECTIVE  UNI INC S&I MOD SED 04/10/2016 Corrie Mckusick, DO [facility]  . IR GENERIC HISTORICAL  03/06/2016   IR RADIOLOGIST EVAL & MGMT 03/06/2016 Corrie Mckusick, DO [facility]  . IR GENERIC  HISTORICAL  03/26/2016   IR RADIOLOGIST EVAL & MGMT 03/26/2016 Corrie Mckusick, DO [facility]  . IR GENERIC HISTORICAL  04/29/2016   IR RADIOLOGIST EVAL & MGMT 04/29/2016 [facility]  . IR RADIOLOGIST EVAL & MGMT  11/12/2016  . IR RADIOLOGIST EVAL & MGMT  12/16/2017  . KNEE SURGERY    . TONSILLECTOMY       OB History   No obstetric history on file.     Family History  Problem Relation Age of Onset  . Alzheimer's disease Mother   . Prostate cancer Father   . Brain cancer Brother   . Brain cancer Brother   . Pancreatic cancer Sister   . Allergic rhinitis Neg Hx   . Asthma Neg Hx   . Eczema Neg Hx   . Urticaria Neg Hx     Social History   Tobacco Use  . Smoking status: Never Smoker  . Smokeless tobacco: Never Used  Vaping Use  . Vaping Use: Never used  Substance Use Topics  . Alcohol use: No  . Drug use: No    Home Medications Prior to Admission medications   Medication Sig Start Date End Date Taking? Authorizing Provider  acetaminophen (TYLENOL) 325 MG tablet Take 2 tablets (650 mg total) by mouth every 6 (six) hours as needed for mild pain (or Fever >/= 101). 02/08/16   Johnson, Clanford L, MD  amLODipine (NORVASC) 10 MG tablet TAKE 1 TABLET BY MOUTH ONCE DAILY Patient taking differently: Take 10 mg by mouth daily. 08/24/20 08/24/21  Glendale Chard, MD  Ascorbic Acid (VITAMIN C) 1000 MG tablet Take 1,000 mg by mouth daily.    [provider]  azelastine (ASTELIN) 0.1 % nasal spray Place 2 sprays into both nostrils daily as needed for rhinitis. 10/13/20   Hongalgi, Lenis Dickinson, MD  benzonatate (TESSALON PERLES) 100 MG capsule Take 1 capsule (100 mg total) by mouth 3 (three) times daily as needed for cough. Patient not taking: No sig reported 12/10/19   Glendale Chard, MD  Carboxymethylcellul-Glycerin (LUBRICATING EYE DROPS OP) Apply 1 drop to eye daily as needed (dry eyes).     [provider]  carvedilol (COREG) 3.125 MG tablet Take 1 tablet (3.125 mg  total) by mouth 2 (two) times daily with a meal. 11/14/20   Glendale Chard, MD  Fexofenadine HCl (ALLEGRA PO) Take 180 mg by mouth at bedtime.     [provider]  glucose blood (ONETOUCH VERIO) test strip Use as instructed to check blood sugar 2 times per day dx: e11.65 11/08/20   Glendale Chard, MD  Lancets [facility] ULTRASOFT) lancets Use as instructed to check blood sugar 2 times per day dx code e11.65 10/19/19   Glendale Chard, MD  pantoprazole (PROTONIX) 40 MG tablet Take 1 tablet (40 mg total) by mouth 2 (two) times daily before a meal. 11/13/20   Glendale Chard, MD  rosuvastatin (CRESTOR) 20 MG tablet TAKE 1 TABLET BY MOUTH ONCE DAILY Patient not taking: Reported on 11/13/2020 08/31/20 08/31/21  Glendale Chard, MD  sucralfate (CARAFATE) 1 GM/10ML suspension Take 10 mLs (1 g total) by mouth 4 (  four) times daily -  with meals and at bedtime for 28 days. 11/13/20 12/11/20  Glendale Chard, MD  trolamine salicylate (ASPERCREME) 10 % cream Apply 1 application topically daily as needed for muscle pain.    [provider]  vitamin B-12 (CYANOCOBALAMIN) 100 MCG tablet Take 100 mcg by mouth daily.    [provider]  omeprazole (PRILOSEC) 20 MG capsule TAKE 1 CAPSULE BY MOUTH ONCE DAILY 30 MINUTES TO 1 HOUR BEFORE A MEAL Patient taking differently: Take 20 mg by mouth daily. 08/06/20 10/13/20  Glendale Chard, MD    Allergies    Amoxicillin, Ampicillin, and Sulfa antibiotics  Review of Systems   Review of Systems  Constitutional: Negative for chills and fever.  HENT: Negative.   Eyes: Negative for visual disturbance.  Respiratory: Negative for cough and shortness of breath.   Cardiovascular: Negative for chest pain.  Gastrointestinal: Positive for vomiting. Negative for abdominal pain, blood in stool, diarrhea and nausea.  Genitourinary: Negative for dysuria and frequency.  Musculoskeletal: Negative for arthralgias and myalgias.  Skin: Negative for color change and rash.   Neurological: Positive for weakness (Generalized) and light-headedness. Negative for dizziness, syncope, numbness and headaches.  All other systems reviewed and are negative.   Physical Exam Updated Vital Signs BP (!) 148/80 (BP Location: Right Arm)   Pulse (!) 118   Resp 18   Ht 5\' 3"  (1.6 m)   Wt 66 kg   LMP  (LMP Unknown)   SpO2 96%   BMI 25.77 kg/m   Physical Exam  ED Results / Procedures / Treatments   Labs (all labs ordered are listed, but only abnormal results are displayed) Labs Reviewed  COMPREHENSIVE METABOLIC PANEL - Abnormal; Notable for the following components:      Result Value   CO2 15 (*)    Glucose, Bld 110 (*)    BUN 24 (*)    Creatinine, Ser 1.21 (*)    Total Bilirubin 1.3 (*)    GFR, Estimated 44 (*)    All other components within normal limits  CBC WITH DIFFERENTIAL/PLATELET - Abnormal; Notable for the following components:   WBC 11.0 (*)    Platelets 500 (*)    Neutro Abs 7.9 (*)    Abs Immature Granulocytes 0.08 (*)    All other components within normal limits  URINALYSIS, ROUTINE W REFLEX MICROSCOPIC - Abnormal; Notable for the following components:   Ketones, ur 20 (*)    Protein, ur 30 (*)    Leukocytes,Ua SMALL (*)    Bacteria, UA MANY (*)    All other components within normal limits  POC OCCULT BLOOD, ED - Abnormal; Notable for the following components:   Fecal Occult Bld POSITIVE (*)    All other components within normal limits  URINE CULTURE  RESP PANEL BY RT-PCR (FLU A&B, COVID) ARPGX2  TYPE AND SCREEN    EKG None  Radiology No results found.  Procedures Procedures {Remember to document critical care time when appropriate:1}  Medications Ordered in ED Medications  cephALEXin (KEFLEX) capsule 500 mg (has no administration in time range)  carvedilol (COREG) tablet 3.125 mg (has no administration in time range)  sodium chloride 0.9 % bolus 1,000 mL (0 mLs Intravenous Stopped 11/14/20 1818)  pantoprazole (PROTONIX) 80 mg in  sodium chloride 0.9 % 100 mL IVPB (0 mg Intravenous Stopped 11/14/20 1816)  sodium chloride 0.9 % bolus 500 mL (0 mLs Intravenous Stopped 11/14/20 2149)    ED Course  I have  reviewed the triage vital signs and the nursing notes.  Pertinent labs & imaging results that were available during my care of the patient were reviewed by me and considered in my medical decision making (see chart for details).    MDM Rules/Calculators/A&P                          *** Final Clinical Impression(s) / ED Diagnoses Final diagnoses:  None    Rx / DC Orders ED Discharge Orders    None

## 2020-11-14 NOTE — ED Triage Notes (Signed)
Patient complains of weakness and emesis x2, son states there was bright red blood in her vomit, states decreased appetitte and had a fall yesterday.

## 2020-11-14 NOTE — ED Notes (Signed)
Pt placed on purewick 

## 2020-11-15 DIAGNOSIS — E860 Dehydration: Secondary | ICD-10-CM | POA: Diagnosis present

## 2020-11-15 DIAGNOSIS — E1122 Type 2 diabetes mellitus with diabetic chronic kidney disease: Secondary | ICD-10-CM | POA: Diagnosis not present

## 2020-11-15 DIAGNOSIS — K2211 Ulcer of esophagus with bleeding: Secondary | ICD-10-CM | POA: Diagnosis present

## 2020-11-15 DIAGNOSIS — B9689 Other specified bacterial agents as the cause of diseases classified elsewhere: Secondary | ICD-10-CM | POA: Diagnosis present

## 2020-11-15 DIAGNOSIS — R9431 Abnormal electrocardiogram [ECG] [EKG]: Secondary | ICD-10-CM

## 2020-11-15 DIAGNOSIS — K920 Hematemesis: Secondary | ICD-10-CM | POA: Diagnosis not present

## 2020-11-15 DIAGNOSIS — I129 Hypertensive chronic kidney disease with stage 1 through stage 4 chronic kidney disease, or unspecified chronic kidney disease: Secondary | ICD-10-CM | POA: Diagnosis present

## 2020-11-15 DIAGNOSIS — N1831 Chronic kidney disease, stage 3a: Secondary | ICD-10-CM | POA: Diagnosis present

## 2020-11-15 DIAGNOSIS — K2210 Ulcer of esophagus without bleeding: Secondary | ICD-10-CM | POA: Diagnosis not present

## 2020-11-15 DIAGNOSIS — Z880 Allergy status to penicillin: Secondary | ICD-10-CM | POA: Diagnosis not present

## 2020-11-15 DIAGNOSIS — R112 Nausea with vomiting, unspecified: Secondary | ICD-10-CM

## 2020-11-15 DIAGNOSIS — R197 Diarrhea, unspecified: Secondary | ICD-10-CM | POA: Diagnosis present

## 2020-11-15 DIAGNOSIS — Z90710 Acquired absence of both cervix and uterus: Secondary | ICD-10-CM | POA: Diagnosis not present

## 2020-11-15 DIAGNOSIS — Z79899 Other long term (current) drug therapy: Secondary | ICD-10-CM | POA: Diagnosis not present

## 2020-11-15 DIAGNOSIS — Z882 Allergy status to sulfonamides status: Secondary | ICD-10-CM | POA: Diagnosis not present

## 2020-11-15 DIAGNOSIS — K449 Diaphragmatic hernia without obstruction or gangrene: Secondary | ICD-10-CM | POA: Diagnosis present

## 2020-11-15 DIAGNOSIS — Z66 Do not resuscitate: Secondary | ICD-10-CM | POA: Diagnosis present

## 2020-11-15 DIAGNOSIS — J189 Pneumonia, unspecified organism: Secondary | ICD-10-CM | POA: Diagnosis not present

## 2020-11-15 DIAGNOSIS — Z86010 Personal history of colonic polyps: Secondary | ICD-10-CM | POA: Diagnosis not present

## 2020-11-15 DIAGNOSIS — Z8673 Personal history of transient ischemic attack (TIA), and cerebral infarction without residual deficits: Secondary | ICD-10-CM | POA: Diagnosis not present

## 2020-11-15 DIAGNOSIS — I10 Essential (primary) hypertension: Secondary | ICD-10-CM | POA: Diagnosis not present

## 2020-11-15 DIAGNOSIS — R8271 Bacteriuria: Secondary | ICD-10-CM | POA: Diagnosis present

## 2020-11-15 DIAGNOSIS — E876 Hypokalemia: Secondary | ICD-10-CM | POA: Diagnosis present

## 2020-11-15 DIAGNOSIS — Z20822 Contact with and (suspected) exposure to covid-19: Secondary | ICD-10-CM | POA: Diagnosis present

## 2020-11-15 DIAGNOSIS — R531 Weakness: Secondary | ICD-10-CM | POA: Diagnosis present

## 2020-11-15 DIAGNOSIS — E782 Mixed hyperlipidemia: Secondary | ICD-10-CM | POA: Diagnosis not present

## 2020-11-15 DIAGNOSIS — N179 Acute kidney failure, unspecified: Secondary | ICD-10-CM | POA: Diagnosis not present

## 2020-11-15 LAB — HEMOGLOBIN A1C
Hgb A1c MFr Bld: 6.3 % — ABNORMAL HIGH (ref 4.8–5.6)
Mean Plasma Glucose: 134.11 mg/dL

## 2020-11-15 LAB — COMPREHENSIVE METABOLIC PANEL
ALT: 14 U/L (ref 0–44)
AST: 15 U/L (ref 15–41)
Albumin: 3.1 g/dL — ABNORMAL LOW (ref 3.5–5.0)
Alkaline Phosphatase: 54 U/L (ref 38–126)
Anion gap: 11 (ref 5–15)
BUN: 19 mg/dL (ref 8–23)
CO2: 16 mmol/L — ABNORMAL LOW (ref 22–32)
Calcium: 8.7 mg/dL — ABNORMAL LOW (ref 8.9–10.3)
Chloride: 110 mmol/L (ref 98–111)
Creatinine, Ser: 0.83 mg/dL (ref 0.44–1.00)
GFR, Estimated: >60 mL/min (ref >60)
Glucose, Bld: 99 mg/dL (ref 70–99)
Potassium: 3.2 mmol/L — ABNORMAL LOW (ref 3.5–5.1)
Sodium: 137 mmol/L (ref 135–145)
Total Bilirubin: 0.6 mg/dL (ref 0.3–1.2)
Total Protein: 6.7 g/dL (ref 6.5–8.1)

## 2020-11-15 LAB — CBC
HCT: 35.5 % — ABNORMAL LOW (ref 36.0–46.0)
HCT: 38.8 % (ref 36.0–46.0)
Hemoglobin: 11.4 g/dL — ABNORMAL LOW (ref 12.0–15.0)
Hemoglobin: 12.7 g/dL (ref 12.0–15.0)
MCH: 29.3 pg (ref 26.0–34.0)
MCH: 30 pg (ref 26.0–34.0)
MCHC: 32.1 g/dL (ref 30.0–36.0)
MCHC: 32.7 g/dL (ref 30.0–36.0)
MCV: 91.3 fL (ref 80.0–100.0)
MCV: 91.7 fL (ref 80.0–100.0)
Platelets: 442 10*3/uL — ABNORMAL HIGH (ref 150–400)
Platelets: 434 10*3/uL — ABNORMAL HIGH (ref 150–400)
RBC: 3.89 MIL/uL (ref 3.87–5.11)
RBC: 4.23 MIL/uL (ref 3.87–5.11)
RDW: 15 % (ref 11.5–15.5)
RDW: 15 % (ref 11.5–15.5)
WBC: 13.4 10*3/uL — ABNORMAL HIGH (ref 4.0–10.5)
WBC: 12.3 10*3/uL — ABNORMAL HIGH (ref 4.0–10.5)
nRBC: 0 % (ref 0.0–0.2)
nRBC: 0 % (ref 0.0–0.2)

## 2020-11-15 LAB — CREATININE, URINE, RANDOM: Creatinine, Urine: 126.49 mg/dL

## 2020-11-15 LAB — MAGNESIUM: Magnesium: 1.8 mg/dL (ref 1.7–2.4)

## 2020-11-15 LAB — CBC WITH DIFFERENTIAL/PLATELET
Abs Immature Granulocytes: 0.07 10*3/uL (ref 0.00–0.07)
Basophils Absolute: 0.1 10*3/uL (ref 0.0–0.1)
Basophils Relative: 0 %
Eosinophils Absolute: 0.1 10*3/uL (ref 0.0–0.5)
Eosinophils Relative: 1 %
HCT: 37 % (ref 36.0–46.0)
Hemoglobin: 12.2 g/dL (ref 12.0–15.0)
Immature Granulocytes: 1 %
Lymphocytes Relative: 17 %
Lymphs Abs: 2 10*3/uL (ref 0.7–4.0)
MCH: 30 pg (ref 26.0–34.0)
MCHC: 33 g/dL (ref 30.0–36.0)
MCV: 90.9 fL (ref 80.0–100.0)
Monocytes Absolute: 0.9 10*3/uL (ref 0.1–1.0)
Monocytes Relative: 8 %
Neutro Abs: 8.7 10*3/uL — ABNORMAL HIGH (ref 1.7–7.7)
Neutrophils Relative %: 73 %
Platelets: 410 10*3/uL — ABNORMAL HIGH (ref 150–400)
RBC: 4.07 MIL/uL (ref 3.87–5.11)
RDW: 15 % (ref 11.5–15.5)
WBC: 11.8 10*3/uL — ABNORMAL HIGH (ref 4.0–10.5)
nRBC: 0 % (ref 0.0–0.2)

## 2020-11-15 LAB — BLOOD GAS, VENOUS
Acid-base deficit: 10.3 mmol/L — ABNORMAL HIGH (ref 0.0–2.0)
Bicarbonate: 14.7 mmol/L — ABNORMAL LOW (ref 20.0–28.0)
O2 Saturation: 97.7 %
Patient temperature: 98.6
pCO2, Ven: 31 mmHg — ABNORMAL LOW (ref 44.0–60.0)
pH, Ven: 7.298 (ref 7.250–7.430)
pO2, Ven: 112 mmHg — ABNORMAL HIGH (ref 32.0–45.0)

## 2020-11-15 LAB — CBG MONITORING, ED: Glucose-Capillary: 107 mg/dL — ABNORMAL HIGH (ref 70–99)

## 2020-11-15 LAB — PROCALCITONIN: Procalcitonin: <0.1 ng/mL

## 2020-11-15 LAB — PHOSPHORUS: Phosphorus: 3.2 mg/dL (ref 2.5–4.6)

## 2020-11-15 LAB — TSH: TSH: 1.012 u[IU]/mL (ref 0.350–4.500)

## 2020-11-15 LAB — GLUCOSE, CAPILLARY
Glucose-Capillary: 96 mg/dL (ref 70–99)
Glucose-Capillary: 85 mg/dL (ref 70–99)
Glucose-Capillary: 86 mg/dL (ref 70–99)
Glucose-Capillary: 109 mg/dL — ABNORMAL HIGH (ref 70–99)
Glucose-Capillary: 163 mg/dL — ABNORMAL HIGH (ref 70–99)

## 2020-11-15 LAB — SODIUM, URINE, RANDOM: Sodium, Ur: 56 mmol/L

## 2020-11-15 LAB — RESP PANEL BY RT-PCR (FLU A&B, COVID) ARPGX2
Influenza A by PCR: NEGATIVE
Influenza B by PCR: NEGATIVE
SARS Coronavirus 2 by RT PCR: NEGATIVE

## 2020-11-15 LAB — LACTIC ACID, PLASMA: Lactic Acid, Venous: 1.1 mmol/L (ref 0.5–1.9)

## 2020-11-15 MED ORDER — POTASSIUM CHLORIDE CRYS ER 20 MEQ PO TBCR
40.0000 meq | EXTENDED_RELEASE_TABLET | Freq: Once | ORAL | Status: AC
  Administered 2020-11-15: 40 meq via ORAL
  Filled 2020-11-15: qty 2

## 2020-11-15 MED ORDER — DOXYCYCLINE HYCLATE 100 MG PO TABS
100.0000 mg | ORAL_TABLET | Freq: Two times a day (BID) | ORAL | Status: DC
  Administered 2020-11-15 – 2020-11-17 (×5): 100 mg via ORAL
  Filled 2020-11-15 (×5): qty 1

## 2020-11-15 MED ORDER — HYDROCODONE-ACETAMINOPHEN 5-325 MG PO TABS
1.0000 | ORAL_TABLET | ORAL | Status: DC | PRN
Start: 2020-11-15 — End: 2020-11-17

## 2020-11-15 MED ORDER — PANTOPRAZOLE SODIUM 40 MG IV SOLR
40.0000 mg | Freq: Two times a day (BID) | INTRAVENOUS | Status: DC
  Administered 2020-11-15 – 2020-11-16 (×4): 40 mg via INTRAVENOUS
  Filled 2020-11-15 (×5): qty 40

## 2020-11-15 MED ORDER — ACETAMINOPHEN 325 MG PO TABS
650.0000 mg | ORAL_TABLET | Freq: Four times a day (QID) | ORAL | Status: DC | PRN
  Administered 2020-11-15 – 2020-11-17 (×3): 650 mg via ORAL
  Filled 2020-11-15 (×3): qty 2

## 2020-11-15 MED ORDER — ENSURE ENLIVE PO LIQD
237.0000 mL | Freq: Two times a day (BID) | ORAL | Status: DC
  Administered 2020-11-16 (×2): 237 mL via ORAL

## 2020-11-15 MED ORDER — INSULIN ASPART 100 UNIT/ML ~~LOC~~ SOLN
0.0000 [IU] | SUBCUTANEOUS | Status: DC
  Administered 2020-11-15 – 2020-11-16 (×2): 2 [IU] via SUBCUTANEOUS
  Filled 2020-11-15: qty 0.09

## 2020-11-15 MED ORDER — ALBUTEROL SULFATE (2.5 MG/3ML) 0.083% IN NEBU: 2.5000 mg | INHALATION_SOLUTION | RESPIRATORY_TRACT | Status: DC | PRN

## 2020-11-15 MED ORDER — SODIUM CHLORIDE 0.9 % IV SOLN
1.0000 g | Freq: Once | INTRAVENOUS | Status: AC
  Administered 2020-11-16: 1 g via INTRAVENOUS
  Filled 2020-11-15: qty 10

## 2020-11-15 MED ORDER — ACETAMINOPHEN 650 MG RE SUPP: 650.0000 mg | Freq: Four times a day (QID) | RECTAL | Status: DC | PRN

## 2020-11-15 MED ORDER — BOOST / RESOURCE BREEZE PO LIQD CUSTOM
1.0000 | ORAL | Status: DC
  Administered 2020-11-17: 1 via ORAL

## 2020-11-15 MED ORDER — ADULT MULTIVITAMIN W/MINERALS CH
1.0000 | ORAL_TABLET | Freq: Every day | ORAL | Status: DC
  Administered 2020-11-16: 1 via ORAL
  Filled 2020-11-15: qty 1

## 2020-11-15 MED ORDER — CARVEDILOL 3.125 MG PO TABS
3.1250 mg | ORAL_TABLET | Freq: Two times a day (BID) | ORAL | Status: DC
  Administered 2020-11-15 – 2020-11-17 (×5): 3.125 mg via ORAL
  Filled 2020-11-15 (×5): qty 1

## 2020-11-15 MED ORDER — SUCRALFATE 1 GM/10ML PO SUSP
1.0000 g | Freq: Three times a day (TID) | ORAL | Status: DC
  Administered 2020-11-15 – 2020-11-17 (×6): 1 g via ORAL
  Filled 2020-11-15 (×6): qty 10

## 2020-11-15 MED ORDER — SODIUM CHLORIDE 0.9 % IV SOLN
75.0000 mL/h | INTRAVENOUS | Status: AC
  Administered 2020-11-15: 75 mL/h via INTRAVENOUS

## 2020-11-15 NOTE — Progress Notes (Addendum)
PROGRESS NOTE  Anna Hoffman UYQ:[id number] DOB: Nov 29, 1934 DOA: 11/14/2020 PCP: Glendale Chard, MD   LOS: 0 days   Brief narrative: Anna Hoffman is a 86 y.o. female with medical history significant for diabetes mellitus type 2, hypertension, CVA presented to hospital with fatigue, lightheadedness over 1 week with 2 episodes of blood tinged emesis.  Patient does have history of erosive esophagitis.  She also complains of mild diarrhea.  Patient also complains of coughing for a week.  In the ED, patient was noted to be tachycardic and received 1500 mL of normal saline.  She was started on PPI.  Chest x-ray showed possible pneumonia and also received Rocephin and Zithromax.  Urine cultures were sent from the ED.   Assessment/Plan:  Active Problems:   Hypertension   Type 2 diabetes mellitus with stage 3 chronic kidney disease, without long-term current use of insulin ([name])   Mixed hyperlipidemia   Acute renal failure superimposed on stage 3a chronic kidney disease ([name])   Prolonged QT interval   Erosive esophagitis   CAP (community acquired pneumonia)   Dehydration  Community-acquired pneumonia On Rocephin and doxycycline.  Follow blood cultures.  Check Legionella and strep pneumo antigen.  Continue to monitor closely.  Chest x-ray on presentation showed focal airspace opacity in the periphery of the right midlung suspicious for pneumonia.  Currently on room air.  Type 2 diabetes mellitus with stage 3 chronic kidney disease, without long-term current use of insulin.  continue sliding scale insulin.  TSH 1.1.  Hemoglobin A1c of 6.3.  We will continue to monitor.  Diabetic diet when p.o. able.     Mixed hyperlipidemia -on Crestor at home.  Currently on hold.  Mild hypokalemia.  Will replace orally.  Check levels in a.m.  Essential hypertension -on Coreg and Norvasc at home.  On Coreg currently.  History of erosive esophagitis-could be the reason for hematemesis.  GI has been  consulted.  Continue IV Protonix twice daily.  Currently on full liquids.  GI has seen the patient and recommended conservative treatment at this time.  Monitor hemoglobin.  Mild acute kidney injury on stage 3a chronic kidney disease.  Improved with hydration.  Baseline creatinine of around 1.1.  Volume depletion.  Improved with hydration.  Reassess in a.m.  Asymptomatic bacteriuria.  follow-up urine culture  QT prolongation. Will monitor closely.  Monitor electrolytes.  History of CVA without residual deficit.  Was on aspirin at home.  Currently on hold.   Weakness, debility.  Will check PT, OT  DVT prophylaxis: SCDs Start: 11/15/20 0325   Code Status: DO NOT RESUSCITATE  Family Communication: I spoke with the patient's son Mr. Anna Hoffman on the phone and updated him about the clinical condition of the patient.  Lives with her son at home  Status is: Inpatient  Remains inpatient appropriate because:Ongoing diagnostic testing needed not appropriate for outpatient work up, IV treatments appropriate due to intensity of illness or inability to take PO and Inpatient level of care appropriate due to severity of illness   Dispo: The patient is from: Home              Anticipated d/c is to: Home in 1 to 2 days.  Await PT evaluation.  Monitor hemoglobin.              Patient currently is not medically stable to d/c.   Difficult to place patient No  Consultants:  GI  Procedures:  None yet  Anti-infectives:  .  Rocephin doxycycline 4/13>  Anti-infectives (From admission, onward)   Start     Dose/Rate Route Frequency Ordered Stop   11/15/20 2300  cefTRIAXone (ROCEPHIN) 1 g in sodium chloride 0.9 % 100 mL IVPB        1 g 200 mL/hr over 30 Minutes Intravenous  Once 11/15/20 0324     11/15/20 1100  doxycycline (VIBRA-TABS) tablet 100 mg        100 mg Oral Every 12 hours 11/15/20 0324     11/14/20 2345  cefTRIAXone (ROCEPHIN) 1 g in sodium chloride 0.9 % 100 mL IVPB         1 g 200 mL/hr over 30 Minutes Intravenous  Once 11/14/20 2334 11/15/20 0021   11/14/20 2345  doxycycline (VIBRA-TABS) tablet 100 mg        100 mg Oral  Once 11/14/20 2334 11/14/20 2350   11/14/20 2230  cephALEXin (KEFLEX) capsule 500 mg        500 mg Oral  Once 11/14/20 2221 11/14/20 2309     Subjective: Today, patient was seen and examined at bedside.  Patient complains of mild cough but no dyspnea chest pain or shortness of breath.  Has not had any nausea vomiting today.   Objective: Vitals:   11/15/20 0259 11/15/20 0707  BP: (!) 148/93 (!) 159/87  Pulse: (!) 109 (!) 105  Resp: 18 16  Temp: 97.9 F (36.6 C) 98 F (36.7 C)  SpO2: 98% 95%    Intake/Output Summary (Last 24 hours) at 11/15/2020 0901 Last data filed at 11/15/2020 0624 Gross per 24 hour  Intake 1888.86 ml  Output --  Net 1888.86 ml   Filed Weights   11/14/20 1705  Weight: 66 kg   Body mass index is 25.77 kg/m.   Physical Exam:  GENERAL: Patient is alert awake and oriented. Not in obvious distress. HENT: No scleral pallor or icterus. Pupils equally reactive to light. Oral mucosa is moist NECK: is supple, no gross swelling noted. CHEST: Clear to auscultation. No crackles or wheezes.  Diminished breath sounds bilaterally. CVS: S1 and S2 heard, no murmur. Regular rate and rhythm.  ABDOMEN: Soft, non-tender, bowel sounds are present. EXTREMITIES: No edema. CNS: Cranial nerves are intact. No focal motor deficits. SKIN: warm and dry without rashes.  Data Review: I have personally reviewed the following laboratory data and studies,  CBC: Recent Labs  Lab 11/14/20 1723 11/15/20 0235 11/15/20 0405  WBC 11.0* 13.4* 11.8*  NEUTROABS 7.9*  --  8.7*  HGB 12.8 11.4* 12.2  HCT 38.4 35.5* 37.0  MCV 89.9 91.3 90.9  PLT 500* 442* 829*   Basic Metabolic Panel: Recent Labs  Lab 11/14/20 1723 11/15/20 0405  NA 138 137  K 3.9 3.2*  CL 111 110  CO2 15* 16*  GLUCOSE 110* 99  BUN 24* 19  CREATININE  1.21* 0.83  CALCIUM 9.4 8.7*  MG  --  1.8  PHOS  --  3.2   Liver Function Tests: Recent Labs  Lab 11/14/20 1723 11/15/20 0405  AST 24 15  ALT 14 14  ALKPHOS 69 54  BILITOT 1.3* 0.6  PROT 7.9 6.7  ALBUMIN 3.6 3.1*   No results for input(s): LIPASE, AMYLASE in the last 168 hours. No results for input(s): AMMONIA in the last 168 hours. Cardiac Enzymes: No results for input(s): CKTOTAL, CKMB, CKMBINDEX, TROPONINI in the last 168 hours. BNP (last 3 results) No results for input(s): BNP in the last 8760 hours.  ProBNP (last 3  results) No results for input(s): PROBNP in the last 8760 hours.  CBG: Recent Labs  Lab 11/15/20 0143 11/15/20 0418 11/15/20 0742  GLUCAP 107* 96 85   Recent Results (from the past 240 hour(s))  Resp Panel by RT-PCR (Flu A&B, Covid) Nasopharyngeal Swab     Status: None   Collection Time: 11/14/20 10:40 PM   Specimen: Nasopharyngeal Swab; Nasopharyngeal(NP) swabs in vial transport medium  Result Value Ref Range Status   SARS Coronavirus 2 by RT PCR NEGATIVE NEGATIVE Final    Comment: (NOTE) SARS-CoV-2 target nucleic acids are NOT DETECTED.  The SARS-CoV-2 RNA is generally detectable in upper respiratory specimens during the acute phase of infection. The lowest concentration of SARS-CoV-2 viral copies this assay can detect is 138 copies/mL. A negative result does not preclude SARS-Cov-2 infection and should not be used as the sole basis for treatment or other patient management decisions. A negative result may occur with  improper specimen collection/handling, submission of specimen other than nasopharyngeal swab, presence of viral mutation(s) within the areas targeted by this assay, and inadequate number of viral copies(<138 copies/mL). A negative result must be combined with clinical observations, patient history, and epidemiological information. The expected result is Negative.  Fact Sheet for Patients:   EntrepreneurPulse.com.au  Fact Sheet for Healthcare Providers:  IncredibleEmployment.be  This test is no t yet approved or cleared by the [place] FDA and  has been authorized for detection and/or diagnosis of SARS-CoV-2 by FDA under an Emergency Use Authorization (EUA). This EUA will remain  in effect (meaning this test can be used) for the duration of the COVID-19 declaration under Section 564(b)(1) of the Act, 21 U.S.C.section 360bbb-3(b)(1), unless the authorization is terminated  or revoked sooner.       Influenza A by PCR NEGATIVE NEGATIVE Final   Influenza B by PCR NEGATIVE NEGATIVE Final    Comment: (NOTE) The Xpert Xpress SARS-CoV-2/FLU/RSV plus assay is intended as an aid in the diagnosis of influenza from Nasopharyngeal swab specimens and should not be used as a sole basis for treatment. Nasal washings and aspirates are unacceptable for Xpert Xpress SARS-CoV-2/FLU/RSV testing.  Fact Sheet for Patients: EntrepreneurPulse.com.au  Fact Sheet for Healthcare Providers: IncredibleEmployment.be  This test is not yet approved or cleared by the [place] FDA and has been authorized for detection and/or diagnosis of SARS-CoV-2 by FDA under an Emergency Use Authorization (EUA). This EUA will remain in effect (meaning this test can be used) for the duration of the COVID-19 declaration under Section 564(b)(1) of the Act, 21 U.S.C. section 360bbb-3(b)(1), unless the authorization is terminated or revoked.  Performed at [facility], [place] [address], [place], [place] [address]   Culture, blood (x 2)     Status: None (Preliminary result)   Collection Time: 11/15/20  2:35 AM   Specimen: BLOOD  Result Value Ref Range Status   Specimen Description   Final    BLOOD BLOOD RIGHT FOREARM Performed at [facility], [place] [address], [place], [place] [address]    Special Requests    Final    BOTTLES DRAWN AEROBIC ONLY Blood Culture adequate volume Performed at [place] [address] [place], [place] [address]    Culture PENDING  Incomplete   Report Status PENDING  Incomplete     Studies: DG Chest Port 1 View  Result Date: 11/14/2020 CLINICAL DATA:  Cough flu like symptom EXAM: PORTABLE CHEST 1 VIEW COMPARISON:  01/31/2020 FINDINGS: Left lung is  clear. Focal airspace disease in the periphery of the right mid lung. Stable cardiomediastinal silhouette with aortic atherosclerosis. No pneumothorax IMPRESSION: Focal airspace disease in the periphery of the right mid lung suspicious for pneumonia. Electronically Signed   By: Donavan Foil M.D.   On: 11/14/2020 23:15      Flora Lipps, MD  [facility] 11/15/2020  If 7PM-7AM, please contact night-coverage

## 2020-11-15 NOTE — Evaluation (Signed)
Physical Therapy Evaluation Patient Details Name: Anna Hoffman MRN: [id number] DOB: [birth year] Today's Date: 11/15/2020   History of Present Illness  86 y.o. female with medical history significant of Dm2, HTN  CVA Presented with 1 wk of fatigue no energy light headed, had 2 episodes of emesis had blood in second, has hx of erosive esophagitis, Reports some diarrhea as well.  Clinical Impression  The  Patient  Currently requires min/minguard assistance for ambulation. Patient  Incontinent of urine upon standing.  Patient reports currently has HHPT, that family is working to have increased caregivers for patient. Patient unsteady and cruising on furniture and quad cane. Patient curently will benefit from increased caregivers in the home.  Pt admitted with above diagnosis. Pt currently with functional limitations due to the deficits listed below (see PT Problem List). Pt will benefit from skilled PT to increase their independence and safety with mobility to allow discharge to the venue listed below.       Follow Up Recommendations Home health PT;Supervision/Assistance - 24 hour vs SNF if 24/7 not available)    Equipment Recommendations  None recommended by PT    Recommendations for Other Services       Precautions / Restrictions Precautions Precautions: Fall Precaution Comments: 1 fall in past month, incontinent, use pads/brief Restrictions Weight Bearing Restrictions: No      Mobility  Bed Mobility Overal bed mobility: Needs Assistance Bed Mobility: Supine to Sit     Supine to sit: Min assist     General bed mobility comments: min A to elevate trunk    Transfers Overall transfer level: Needs assistance Equipment used: Straight cane Transfers: Stand Pivot Transfers Sit to Stand: Min guard Stand pivot transfers: Min guard       General transfer comment: min G for steadying  Ambulation/Gait Ambulation/Gait assistance: Min Web designer (Feet): 15  Feet Assistive device: Quad cane Gait Pattern/deviations: Step-to pattern Gait velocity: decr   General Gait Details: holding onto foot of bed in addition to quad cane to stabilize  Stairs            Wheelchair Mobility    Modified Rankin (Stroke Patients Only)       Balance Overall balance assessment: History of Falls;Needs assistance Sitting-balance support: Feet supported Sitting balance-Leahy Scale: Good     Standing balance support: Single extremity supported Standing balance-Leahy Scale: Poor Standing balance comment: reliant on external support                             Pertinent Vitals/Pain Pain Assessment: Faces Faces Pain Scale: No hurt    Home Living Family/patient expects to be discharged to:: Private residence Living Arrangements: Children Available Help at Discharge: Family Type of Home: House Home Access: Level entry     Home Layout: One level Home Equipment: Shower seat;Bedside commode;Grab bars - tub/shower;Walker - 2 wheels;Cane - quad Additional Comments: patient reports her sons are arranging for a caregiver every day "to get me out of the house", reports lives with son who may not be home 24/7    Prior Function Level of Independence: Independent with assistive device(s)         Comments: patient reports using cane and furniture cruising, also states she has a walker she uses sometimes. patient states has been working with HHPT 3x/week     Hand Dominance   Dominant Hand: Right    Extremity/Trunk Assessment   Upper Extremity Assessment Upper  Extremity Assessment: Generalized weakness    Lower Extremity Assessment Lower Extremity Assessment: Generalized weakness    Cervical / Trunk Assessment Cervical / Trunk Assessment: Normal  Communication   Communication: HOH  Cognition Arousal/Alertness: Awake/alert Behavior During Therapy: WFL for tasks assessed/performed Overall Cognitive Status: No family/caregiver  present to determine baseline cognitive functioning                                 General Comments: follows 1 step directions appropriately, tangential      General Comments      Exercises     Assessment/Plan    PT Assessment Patient needs continued PT services  PT Problem List Decreased strength;Decreased mobility;Decreased safety awareness;Decreased activity tolerance;Decreased balance;Decreased knowledge of use of DME       PT Treatment Interventions DME instruction;Therapeutic activities;Gait training;Therapeutic exercise;Patient/family education;Functional mobility training    PT Goals (Current goals can be found in the Care Plan section)  Acute Rehab PT Goals Patient Stated Goal: home PT Goal Formulation: With patient Time For Goal Achievement: 11/29/20 Potential to Achieve Goals: Fair    Frequency Min 3X/week   Barriers to discharge Decreased caregiver support      Co-evaluation               AM-PAC PT "6 Clicks" Mobility  Outcome Measure Help needed turning from your back to your side while in a flat bed without using bedrails?: A Little Help needed moving from lying on your back to sitting on the side of a flat bed without using bedrails?: A Little Help needed moving to and from a bed to a chair (including a wheelchair)?: A Lot Help needed standing up from a chair using your arms (e.g., wheelchair or bedside chair)?: A Lot Help needed to walk in hospital room?: A Lot Help needed climbing 3-5 steps with a railing? : A Lot 6 Click Score: 14    End of Session Equipment Utilized During Treatment: Gait belt Activity Tolerance: Patient tolerated treatment well Patient left: in chair;with call bell/phone within reach;with chair alarm set Nurse Communication: Mobility status PT Visit Diagnosis: Difficulty in walking, not elsewhere classified (R26.2)    Time: 0867-6195 PT Time Calculation (min) (ACUTE ONLY): 31 min   Charges:   PT  Evaluation $PT Eval Low Complexity: [place] Pager [phone] Office [phone]   [name] 11/15/2020, 1:44 PM

## 2020-11-15 NOTE — Progress Notes (Signed)
Initial Nutrition Assessment  RD working remotely.  DOCUMENTATION CODES:   Not applicable  INTERVENTION:  - will order Boost Breeze BID, each supplement provides 250 kcal and 9 grams of protein. - will order Ensure Enlive BID, each supplement provides 350 kcal and 20 grams of protein - will order 1 tablet multivitamin with minerals/day. - complete NFPE when feasible.    NUTRITION DIAGNOSIS:   Increased nutrient needs related to acute illness as evidenced by estimated needs.  GOAL:   Patient will meet greater than or equal to 90% of their needs  MONITOR:   PO intake,Supplement acceptance,Diet advancement,Labs,Weight trends  REASON FOR ASSESSMENT:   Malnutrition Screening Tool,Consult Assessment of nutrition requirement/status  ASSESSMENT:   86 y.o. female with medical history of type 2 DM, HTN, erosive esophagitis, and CVA. She presented to ED with fatigue, lightheadedness, and cough over 1 week with 2 episodes of blood tinged emesis. Chest x-ray showed possible PNA.  Diet advanced from NPO to FLD at 1110 and no intakes documented since that time. ONS ordered as outlined above.   She has not been seen by a [facility] RD since 07/2019.   Weight yesterday was 145 lb and weight has been stable for the past 9 months. No information documented in the edema section of flow sheet.   Per notes: - CAP - hx of erosive esophagitis--GI consulted and following - mild AKI    Labs reviewed; HgbA1c: 6.3%, CBGs: 96, 85, 86, 109 mg/dl, K: 3.2 mmol/l, Ca: 8.7 mg/dl. Medications reviewed; sliding scale novolog, 40 mg IV protonix BID, 1 g carafate TID.     NUTRITION - FOCUSED PHYSICAL EXAM:  unable to complete at this time.  Diet Order:   Diet Order            Diet full liquid Room service appropriate? Yes; Fluid consistency: Thin  Diet effective now                 EDUCATION NEEDS:   No education needs have been identified at this time  Skin:  Skin Assessment:  Reviewed RN Assessment  Last BM:  PTA/unknown  Height:   Ht Readings from Last 1 Encounters:  11/14/20 5\' 3"  (1.6 m)    Weight:   Wt Readings from Last 1 Encounters:  11/14/20 66 kg    Estimated Nutritional Needs:  Kcal:  1500-1700 kcal Protein:  70-80 grams Fluid:  >/= 1.7 L/day      Jarome Matin, MS, RD, LDN, CNSC Inpatient Clinical Dietitian RD pager # available in [facility]  After hours/weekend pager # available in [facility]

## 2020-11-15 NOTE — Evaluation (Signed)
Occupational Therapy Evaluation Patient Details Name: Anna Hoffman MRN: [id number] DOB: [birth year] Today's Date: 11/15/2020    History of Present Illness 86 y.o. female with medical history significant of Dm2, HTN  CVA Presented with 1 wk of fatigue no energy light headed, had 2 episodes of emesis had blood in second, has hx of erosive esophagitis, Reports some diarrhea as well.   Clinical Impression   Patient lives with her son in a single level home with level entry. Patient states she is mod I with self care, has HHPT 3x/week and that her sons are arranging caregivers everyday "to get me out of the house." Currently patient overall min G assist for functional ambulation, transfers and standing ADL tasks due to mild unsteadiness. Patient incontinent upon standing from edge of bed, states she wears pads at home "sometimes." Due to patient hx of falling would recommend 24/7 assist at discharge at least initially while patient regain overall strength, endurance. Acute OT to follow.     Follow Up Recommendations  Home health OT;Supervision/Assistance - 24 hour (vs SNF if 24/7 not available)    Equipment Recommendations  None recommended by OT       Precautions / Restrictions Precautions Precautions: Fall Precaution Comments: 1 fall in past month Restrictions Weight Bearing Restrictions: No      Mobility Bed Mobility Overal bed mobility: Needs Assistance Bed Mobility: Supine to Sit     Supine to sit: Min assist     General bed mobility comments: min A to elevate trunk    Transfers Overall transfer level: Needs assistance Equipment used: Straight cane Transfers: Sit to/from Stand Sit to Stand: Min guard         General transfer comment: min G for steadying    Balance Overall balance assessment: History of Falls;Needs assistance Sitting-balance support: Feet supported Sitting balance-Leahy Scale: Good     Standing balance support: Single extremity  supported Standing balance-Leahy Scale: Poor Standing balance comment: reliant on external support                           ADL either performed or assessed with clinical judgement   ADL Overall ADL's : Needs assistance/impaired     Grooming: Wash/dry hands;Set up;Sitting   Upper Body Bathing: Set up;Sitting   Lower Body Bathing: Min guard;Sit to/from stand   Upper Body Dressing : Set up;Sitting   Lower Body Dressing: Min guard;Sit to/from stand;Sitting/lateral leans Lower Body Dressing Details (indicate cue type and reason): patient able to doff soiled socks, don clean ones seated. min G in standing for balance to pull up mesh underwear. needs increased time Toilet Transfer: Min guard;Ambulation (cane) Toilet Transfer Details (indicate cue type and reason): min G for safety, patient incontinent of urine upon standing Toileting- Clothing Manipulation and Hygiene: Min guard;Sitting/lateral lean;Sit to/from stand       Functional mobility during ADLs: Min guard (cane) General ADL Comments: patient appears close to her baseline, does need increased time for self care, ambulation due to decreased activity tolerance "I haven't had anything to drink or eat"                  Pertinent Vitals/Pain Pain Assessment: Faces Faces Pain Scale: No hurt     Hand Dominance Right   Extremity/Trunk Assessment Upper Extremity Assessment Upper Extremity Assessment: Generalized weakness   Lower Extremity Assessment Lower Extremity Assessment: Defer to PT evaluation       Communication Communication Communication:  HOH   Cognition Arousal/Alertness: Awake/alert Behavior During Therapy: WFL for tasks assessed/performed Overall Cognitive Status: No family/caregiver present to determine baseline cognitive functioning                                 General Comments: follows 1 step directions appropriately, tangential              Home Living  Family/patient expects to be discharged to:: Private residence Living Arrangements: Children Available Help at Discharge: Family Type of Home: House Home Access: Level entry     Home Layout: One level     Bathroom Shower/Tub: Tub/shower unit         Home Equipment: Shower seat;Bedside commode;Grab bars - tub/shower;Walker - 2 wheels;Cane - quad   Additional Comments: patient reports her sons are arranging for a caregiver every day "to get me out of the house"      Prior Functioning/Environment Level of Independence: Independent with assistive device(s)        Comments: patient reports using cane and furniture cruising, also states she has a walker she uses sometimes. patient states has been working with HHPT 3x/week        OT Problem List: Decreased activity tolerance;Impaired balance (sitting and/or standing);Decreased safety awareness      OT Treatment/Interventions: Self-care/ADL training;Therapeutic activities;Patient/family education;Balance training;Therapeutic exercise;DME and/or AE instruction    OT Goals(Current goals can be found in the care plan section) Acute Rehab OT Goals Patient Stated Goal: home OT Goal Formulation: With patient Time For Goal Achievement: 11/29/20 Potential to Achieve Goals: Good  OT Frequency: Min 2X/week    AM-PAC OT "6 Clicks" Daily Activity     Outcome Measure Help from another person eating meals?: None Help from another person taking care of personal grooming?: A Little Help from another person toileting, which includes using toliet, bedpan, or urinal?: A Little Help from another person bathing (including washing, rinsing, drying)?: A Little Help from another person to put on and taking off regular upper body clothing?: A Little Help from another person to put on and taking off regular lower body clothing?: A Little 6 Click Score: 19   End of Session Equipment Utilized During Treatment: Gait belt (cane) Nurse Communication:  Mobility status  Activity Tolerance: Patient tolerated treatment well Patient left: in chair;with call bell/phone within reach;with chair alarm set  OT Visit Diagnosis: Muscle weakness (generalized) (M62.81);History of falling (Z91.81)                Time: 8466-5993 OT Time Calculation (min): 30 min Charges:  OT General Charges $OT Visit: 1 Visit OT Evaluation $OT Eval Low Complexity: [place] OT OT pager: [place] 11/15/2020, 1:00 PM

## 2020-11-15 NOTE — Consult Note (Addendum)
Consultation  Referring Provider: [facility]/ Pok[facility] Primary Care Physician:  Glendale Chard, MD Primary Gastroenterologist:  Dr. Ardis Hughs  Reason for Consultation: Nausea, vomiting x 2 with hematemesis x 1  HPI: Anna Hoffman is a 86 y.o. female, who was admitted last night through the ED with complaints of weakness and cough.  She had apparently vomited twice yesterday and brought up some blood with the second episode. On further evaluation in the ER chest x-ray revealed a right-sided pneumonia and she has been started on IV antibiotics. Initial labs showed WBC 11.0, hemoglobin 12.8 hematocrit 38.4 BUN 24/creatinine 1.21  Today WBC of 13.4 last hemoglobin 12.2. COVID-19 negative.  Patient was seen during very recent admission 10/12/2020 with complaints of dysphagia and underwent EGD at that time per Dr. Silverio Decamp with finding of grade C distal esophagitis/erosive esophagitis there is no active bleeding and was noted to have a 5 cm hiatal hernia.  She was discharged on twice daily PPI. Other medical problems include prolonged QT, history of adenomatous colon polyps, adult onset diabetes mellitus, prior CVA. She is not anticoagulated.  On discussion with patient today she says she has been doing very well since she had the endoscopy a month ago and has not had any issues with dysphagia.  She says she has been taking her medications as directed, on Carafate suspension 3 times daily and twice daily PPI.  She denies any odynophagia. Yesterday she became nauseated and vomited.  She says with the second episode of emesis she brought up some streaks of blood and then after that was also coughing quite a bit and believes that she coughed up a little bit of blood. She has not had any nausea or vomiting since, no complaints of dysphagia or odynophagia. She is hungry today. Patient says she occasionally has an episode of diarrhea which has been longstanding for her, she has not had any change in that  pattern.  No melena or hematochezia. She has been placed on enteric precautions but has not had any diarrhea yesterday or today.  Past Medical History:  Diagnosis Date  . Arthritis   . Diabetes mellitus without complication ([place])   . Hypertension   . Hypokalemia   . Pneumonia   . Shingles   . Stroke [facility])     Past Surgical History:  Procedure Laterality Date  . ABDOMINAL HYSTERECTOMY    . BREAST EXCISIONAL BIOPSY Left   . ESOPHAGOGASTRODUODENOSCOPY N/A 09/01/2014   Procedure: ESOPHAGOGASTRODUODENOSCOPY (EGD);  Surgeon: Lear Ng, MD;  Location: [facility];  Service: Endoscopy;  Laterality: N/A;  . ESOPHAGOGASTRODUODENOSCOPY (EGD) WITH PROPOFOL N/A 10/12/2020   Procedure: ESOPHAGOGASTRODUODENOSCOPY (EGD) WITH PROPOFOL;  Surgeon: Mauri Pole, MD;  Location: [facility];  Service: Endoscopy;  Laterality: N/A;  . HIATAL HERNIA REPAIR N/A 09/04/2014   Procedure: LAPAROSCOPIC REPAIR OF HIATAL HERNIA;  Surgeon: Excell Seltzer, MD;  Location: [facility];  Service: General;  Laterality: N/A;  With MESH  . IR GENERIC HISTORICAL  04/10/2016   IR US GUIDE VASC ACCESS RIGHT 04/10/2016 Corrie Mckusick, DO [facility]  . IR GENERIC HISTORICAL  04/10/2016   IR ANGIOGRAM SELECTIVE EACH ADDITIONAL VESSEL 04/10/2016 Corrie Mckusick, DO [facility]  . IR GENERIC HISTORICAL  04/10/2016   IR ANGIOGRAM SELECTIVE EACH ADDITIONAL VESSEL 04/10/2016 Corrie Mckusick, DO [facility]  . IR GENERIC HISTORICAL  04/10/2016   IR EMBO TUMOR ORGAN ISCHEMIA INFARCT INC GUIDE ROADMAPPING 04/10/2016 Corrie Mckusick, DO [facility]  . IR GENERIC HISTORICAL  04/10/2016  IR ANGIOGRAM SELECTIVE EACH ADDITIONAL VESSEL 04/10/2016 Corrie Mckusick, DO [facility]  . IR GENERIC HISTORICAL  04/10/2016   IR ANGIOGRAM SELECTIVE EACH ADDITIONAL VESSEL 04/10/2016 Corrie Mckusick, DO [facility]  . IR GENERIC HISTORICAL  04/10/2016   IR RENAL SELECTIVE  UNI INC S&I MOD SED 04/10/2016 Corrie Mckusick, DO [facility]  . IR GENERIC HISTORICAL  03/06/2016    IR RADIOLOGIST EVAL & MGMT 03/06/2016 Corrie Mckusick, DO [facility]  . IR GENERIC HISTORICAL  03/26/2016   IR RADIOLOGIST EVAL & MGMT 03/26/2016 Corrie Mckusick, DO [facility]  . IR GENERIC HISTORICAL  04/29/2016   IR RADIOLOGIST EVAL & MGMT 04/29/2016 [facility]  . IR RADIOLOGIST EVAL & MGMT  11/12/2016  . IR RADIOLOGIST EVAL & MGMT  12/16/2017  . KNEE SURGERY    . TONSILLECTOMY      Prior to Admission medications   Medication Sig Start Date End Date Taking? Authorizing Provider  acetaminophen (TYLENOL) 325 MG tablet Take 2 tablets (650 mg total) by mouth every 6 (six) hours as needed for mild pain (or Fever >/= 101). 02/08/16  Yes Johnson, Clanford L, MD  amLODipine (NORVASC) 10 MG tablet TAKE 1 TABLET BY MOUTH ONCE DAILY Patient taking differently: Take 10 mg by mouth daily. 08/24/20 08/24/21 Yes Glendale Chard, MD  Ascorbic Acid (VITAMIN C) 1000 MG tablet Take 1,000 mg by mouth daily.   Yes [provider]  azelastine (ASTELIN) 0.1 % nasal spray Place 2 sprays into both nostrils daily as needed for rhinitis. 10/13/20  Yes Hongalgi, Lenis Dickinson, MD  Carboxymethylcellul-Glycerin (LUBRICATING EYE DROPS OP) Apply 1 drop to eye daily as needed (dry eyes).    Yes [provider]  carvedilol (COREG) 3.125 MG tablet Take 1 tablet (3.125 mg total) by mouth 2 (two) times daily with a meal. 11/14/20  Yes Glendale Chard, MD  Fexofenadine HCl (ALLEGRA PO) Take 180 mg by mouth at bedtime.    Yes [provider]  pantoprazole (PROTONIX) 40 MG tablet Take 1 tablet (40 mg total) by mouth 2 (two) times daily before a meal. 11/13/20  Yes Glendale Chard, MD  sucralfate (CARAFATE) 1 GM/10ML suspension Take 10 mLs (1 g total) by mouth 4 (four) times daily -  with meals and at bedtime for 28 days. 11/13/20 12/11/20 Yes Glendale Chard, MD  trolamine salicylate (ASPERCREME) 10 % cream Apply 1 application topically daily as needed for muscle pain.   Yes [provider]  vitamin  B-12 (CYANOCOBALAMIN) 100 MCG tablet Take 100 mcg by mouth daily.   Yes [provider]  benzonatate (TESSALON PERLES) 100 MG capsule Take 1 capsule (100 mg total) by mouth 3 (three) times daily as needed for cough. Patient not taking: No sig reported 12/10/19   Glendale Chard, MD  glucose blood [facility] VERIO) test strip Use as instructed to check blood sugar 2 times per day dx: e11.65 11/08/20   Glendale Chard, MD  Lancets [facility] ULTRASOFT) lancets Use as instructed to check blood sugar 2 times per day dx code e11.65 10/19/19   Glendale Chard, MD  rosuvastatin (CRESTOR) 20 MG tablet TAKE 1 TABLET BY MOUTH ONCE DAILY Patient not taking: No sig reported 08/31/20 08/31/21  Glendale Chard, MD  omeprazole (PRILOSEC) 20 MG capsule TAKE 1 CAPSULE BY MOUTH ONCE DAILY 30 MINUTES TO 1 HOUR BEFORE A MEAL Patient taking differently: Take 20 mg by mouth daily. 08/06/20 10/13/20  Glendale Chard, MD    Current Facility-Administered Medications  Medication  Dose Route Frequency Provider Last Rate Last Admin  . 0.9 %  sodium chloride infusion  75 mL/hr Intravenous Continuous Toy Baker, MD 75 mL/hr at 11/15/20 0355 75 mL/hr at 11/15/20 0355  . acetaminophen (TYLENOL) tablet 650 mg  650 mg Oral Q6H PRN Toy Baker, MD       Or  . acetaminophen (TYLENOL) suppository 650 mg  650 mg Rectal Q6H PRN Doutova, Anastassia, MD      . albuterol (PROVENTIL) (2.5 MG/3ML) 0.083% nebulizer solution 2.5 mg  2.5 mg Nebulization Q2H PRN Doutova, Anastassia, MD      . carvedilol (COREG) tablet 3.125 mg  3.125 mg Oral BID WC Doutova, Anastassia, MD      . cefTRIAXone (ROCEPHIN) 1 g in sodium chloride 0.9 % 100 mL IVPB  1 g Intravenous Once Doutova, Anastassia, MD      . doxycycline (VIBRA-TABS) tablet 100 mg  100 mg Oral Q12H Doutova, Anastassia, MD      . HYDROcodone-acetaminophen (NORCO/VICODIN) 5-325 MG per tablet 1-2 tablet  1-2 tablet Oral Q4H PRN Doutova, Anastassia, MD      . insulin aspart (novoLOG)  injection 0-9 Units  0-9 Units Subcutaneous Q4H Doutova, Anastassia, MD      . pantoprazole (PROTONIX) injection 40 mg  40 mg Intravenous Q12H Pokhrel, Laxman, MD   40 mg at 11/15/20 1015    Allergies as of 11/14/2020 - Review Complete 11/14/2020  Allergen Reaction Noted  . Amoxicillin Diarrhea 12/06/2011  . Ampicillin Rash 05/29/2016  . Sulfa antibiotics Rash 12/06/2011    Family History  Problem Relation Age of Onset  . Alzheimer's disease Mother   . Prostate cancer Father   . Brain cancer Brother   . Brain cancer Brother   . Pancreatic cancer Sister   . Allergic rhinitis Neg Hx   . Asthma Neg Hx   . Eczema Neg Hx   . Urticaria Neg Hx     Social History   Socioeconomic History  . Marital status: Widowed    Spouse name: Not on file  . Number of children: 2  . Years of education: Not on file  . Highest education level: Not on file  Occupational History  . Occupation: retired  Tobacco Use  . Smoking status: Never Smoker  . Smokeless tobacco: Never Used  Vaping Use  . Vaping Use: Never used  Substance and Sexual Activity  . Alcohol use: No  . Drug use: No  . Sexual activity: Not Currently    Birth control/protection: Surgical  Other Topics Concern  . Not on file  Social History Narrative   Son [name] lives with her   Social Determinants of Health   Financial Resource Strain: Low Risk   . Difficulty of Paying Living Expenses: Not hard at all  Food Insecurity: No Food Insecurity  . Worried About Charity fundraiser in the Last Year: Never true  . Ran Out of Food in the Last Year: Never true  Transportation Needs: No Transportation Needs  . Lack of Transportation (Medical): No  . Lack of Transportation (Non-Medical): No  Physical Activity: Insufficiently Active  . Days of Exercise per Week: 7 days  . Minutes of Exercise per Session: 10 min  Stress: No Stress Concern Present  . Feeling of Stress : Not at all  Social Connections: Not on file  Intimate Partner  Violence: Not on file    Review of Systems: Pertinent positive and negative review of systems were noted in the above HPI section.  All other review of systems was otherwise negative.   Physical Exam: Vital signs in last 24 hours: Temp:  [97.9 F (36.6 C)-98.7 F (37.1 C)] 98 F (36.7 C) (04/14 0707) Pulse Rate:  [100-118] 105 (04/14 0707) Resp:  [16-25] 16 (04/14 0707) BP: (121-164)/(62-98) 159/87 (04/14 0707) SpO2:  [94 %-100 %] 95 % (04/14 0707) Weight:  [66 kg] 66 kg (04/13 1705) Last BM Date: 11/14/20 General:   Alert, well-developed, well-nourished, elderly African-American female very pleasant and cooperative in NAD Head:  Normocephalic and atraumatic. Eyes:  Sclera clear, no icterus.   Conjunctiva pink. Ears:  Normal auditory acuity. Nose:  No deformity, discharge,  or lesions. Mouth:  No deformity or lesions.   Neck:  Supple; no masses or thyromegaly. Lungs:  Clear throughout to auscultation.   No wheezes, crackles, or rhonchi. Heart:  Regular rate and rhythm; no murmurs, clicks, rubs,  or gallops. Abdomen:  Soft,nontender, BS active,nonpalp mass or hsm.   Rectal: Not done Msk:  Symmetrical without gross deformities. . Pulses:  Normal pulses noted. Extremities:  Without clubbing or edema. Neurologic:  Alert and  oriented x4;  grossly normal neurologically. Skin:  Intact without significant lesions or rashes.. Psych:  Alert and cooperative. Normal mood and affect.  Intake/Output from previous day: 04/13 0701 - 04/14 0700 In: 1888.9 [I.V.:230.3; IV Piggyback:1658.5] Out: -  Intake/Output this shift: No intake/output data recorded.  Lab Results: Recent Labs    11/14/20 1723 11/15/20 0235 11/15/20 0405  WBC 11.0* 13.4* 11.8*  HGB 12.8 11.4* 12.2  HCT 38.4 35.5* 37.0  PLT 500* 442* 410*   BMET Recent Labs    11/14/20 1723 11/15/20 0405  NA 138 137  K 3.9 3.2*  CL 111 110  CO2 15* 16*  GLUCOSE 110* 99  BUN 24* 19  CREATININE 1.21* 0.83  CALCIUM 9.4  8.7*   LFT Recent Labs    11/15/20 0405  PROT 6.7  ALBUMIN 3.1*  AST 15  ALT 14  ALKPHOS 54  BILITOT 0.6    IMPRESSION:  17. 86 year old female admitted with weakness and cough over the past week and found to have a right-sided pneumonia. 2. Nausea and vomiting x 2 yesterday second episode with streaks of heme. She has not had any further hematemesis, no melena.  Has no complaints of dysphagia or odynophagia.  Hemoglobin is stable.  I do not think she is having any active GI bleeding.  She had very recent EGD done last month with finding of distal esophagitis. She likely had some esophageal irritation with retching with vomiting  3.  Adult onset diabetes mellitus 4.  Prior CVA 5.  Prolonged QT 6.  History of colon polyps  Plan:  Full liquid diet, advance to soft diet as tolerated Okay to leave on IV PPI today, then convert to Protonix 1 p.o. twice daily Will restart Carafate suspension 1 g 3 times daily between meals Has not had any diarrhea, discontinue enteric precautions  Follow serial hemoglobins, stable since admit  GI will not continue to follow, please call for change in status or evidence of active bleeding, otherwise plan to continue her on twice daily PPI on discharge and Carafate suspension 1 g 3 times daily.   Amy Esterwood PA-C 11/15/2020, 10:42 AM      Attending Physician Note   I have taken a history, examined the patient and reviewed the chart. I agree with the Advanced Practitioner's note, impression and recommendations.  86 yo female admitted with a cough,  pneumonia and N/V twice yesterday. The second episode of vomiting had streaks of blood. No further N/V or hematemesis. EGD 4 weeks ago showed LA Class C esophagitis and a 5 cm hiatal hernia. Hgb is stable since admission. Self limited mild hematemeis from known esophagitis or a small mucosal tear. LA class C esophagitis could take 2-3 months to fully heal.   No need for repeat EGD at this time   Continue pantoprazole 40 mg bid Continue sucralfate suspension 1 g tid Full liquids and advance as tolerated Outpatient GI follow up scheduled on 4/22 GI signing off, available if needed   Lucio Edward, MD [facility] [phone]

## 2020-11-15 NOTE — Plan of Care (Signed)
  Problem: Education: Goal: Ability to state activities that reduce stress will improve Outcome: Progressing   Problem: Nutrition: Goal: Adequate nutrition will be maintained Outcome: Progressing   Problem: Pain Managment: Goal: General experience of comfort will improve Outcome: Progressing   Problem: Safety: Goal: Ability to remain free from injury will improve Outcome: Progressing

## 2020-11-16 ENCOUNTER — Encounter (HOSPITAL_COMMUNITY): Payer: Self-pay | Admitting: Internal Medicine

## 2020-11-16 DIAGNOSIS — K2210 Ulcer of esophagus without bleeding: Secondary | ICD-10-CM

## 2020-11-16 DIAGNOSIS — E860 Dehydration: Secondary | ICD-10-CM

## 2020-11-16 DIAGNOSIS — I10 Essential (primary) hypertension: Secondary | ICD-10-CM

## 2020-11-16 DIAGNOSIS — E1122 Type 2 diabetes mellitus with diabetic chronic kidney disease: Secondary | ICD-10-CM

## 2020-11-16 DIAGNOSIS — E782 Mixed hyperlipidemia: Secondary | ICD-10-CM

## 2020-11-16 DIAGNOSIS — J189 Pneumonia, unspecified organism: Principal | ICD-10-CM

## 2020-11-16 DIAGNOSIS — N1832 Chronic kidney disease, stage 3b: Secondary | ICD-10-CM

## 2020-11-16 LAB — PHOSPHORUS: Phosphorus: 2.8 mg/dL (ref 2.5–4.6)

## 2020-11-16 LAB — BASIC METABOLIC PANEL
Anion gap: 10 (ref 5–15)
BUN: 15 mg/dL (ref 8–23)
CO2: 15 mmol/L — ABNORMAL LOW (ref 22–32)
Calcium: 8.9 mg/dL (ref 8.9–10.3)
Chloride: 113 mmol/L — ABNORMAL HIGH (ref 98–111)
Creatinine, Ser: 0.73 mg/dL (ref 0.44–1.00)
GFR, Estimated: >60 mL/min (ref >60)
Glucose, Bld: 108 mg/dL — ABNORMAL HIGH (ref 70–99)
Potassium: 3.6 mmol/L (ref 3.5–5.1)
Sodium: 138 mmol/L (ref 135–145)

## 2020-11-16 LAB — GLUCOSE, CAPILLARY
Glucose-Capillary: 100 mg/dL — ABNORMAL HIGH (ref 70–99)
Glucose-Capillary: 107 mg/dL — ABNORMAL HIGH (ref 70–99)
Glucose-Capillary: 108 mg/dL — ABNORMAL HIGH (ref 70–99)
Glucose-Capillary: 112 mg/dL — ABNORMAL HIGH (ref 70–99)
Glucose-Capillary: 174 mg/dL — ABNORMAL HIGH (ref 70–99)
Glucose-Capillary: 73 mg/dL (ref 70–99)

## 2020-11-16 LAB — URINE CULTURE

## 2020-11-16 LAB — MAGNESIUM: Magnesium: 1.7 mg/dL (ref 1.7–2.4)

## 2020-11-16 MED ORDER — SODIUM CHLORIDE 0.9 % IV SOLN: INTRAVENOUS | Status: DC

## 2020-11-16 MED ORDER — INSULIN ASPART 100 UNIT/ML ~~LOC~~ SOLN: 0.0000 [IU] | Freq: Three times a day (TID) | SUBCUTANEOUS | Status: DC

## 2020-11-16 NOTE — Progress Notes (Signed)
Physical Therapy Treatment Patient Details Name: Anna Hoffman MRN: [id number] DOB: Jul 08, 1935 Today's Date: 11/16/2020    History of Present Illness 86 y.o. female with medical history significant of Dm2, HTN  CVA Presented with 1 wk of fatigue no energy light headed, had 2 episodes of emesis had blood in second, has hx of erosive esophagitis, Reports some diarrhea as well.    PT Comments    Patient demonstrates decreased balance while ambulating using quad cane. Patient instructed to use  Her rollator at home and she should have assistance. Patient declined  To consider post acute rehab. Patient states son is looking for in home caregivers. Continue PT for progressive ambulation.  Follow Up Recommendations  Home health PT;Supervision/Assistance - 24 hour     Equipment Recommendations  None recommended by PT    Recommendations for Other Services       Precautions / Restrictions Precautions Precautions: Fall Precaution Comments: 1 fall in past month,    Mobility  Bed Mobility   Bed Mobility: Supine to Sit;Sit to Supine     Supine to sit: Supervision Sit to supine: Supervision   General bed mobility comments: no asssitance    Transfers Overall transfer level: Needs assistance Equipment used: Quad cane   Sit to Stand: Min guard            Ambulation/Gait Ambulation/Gait assistance: Herbalist (Feet): 120 Feet Assistive device: Quad cane Gait Pattern/deviations: Staggering left;Staggering right     General Gait Details: intermittently holds to counter and hall rail. where there is not rail, required PT hand hold as well as quad cane.  unsteady balance without 2 supports.   Stairs             Wheelchair Mobility    Modified Rankin (Stroke Patients Only)       Balance Overall balance assessment: Needs assistance Sitting-balance support: Feet supported Sitting balance-Leahy Scale: Good     Standing balance support: During  functional activity;Bilateral upper extremity supported Standing balance-Leahy Scale: Poor Standing balance comment: reliant on external support, reaches for objects to steady self                            Cognition Arousal/Alertness: Awake/alert Behavior During Therapy: WFL for tasks assessed/performed                                          Exercises      General Comments        Pertinent Vitals/Pain Pain Assessment: No/denies pain    Home Living                      Prior Function            PT Goals (current goals can now be found in the care plan section) Progress towards PT goals: Progressing toward goals    Frequency    Min 3X/week      PT Plan Current plan remains appropriate    Co-evaluation              AM-PAC PT "6 Clicks" Mobility   Outcome Measure  Help needed turning from your back to your side while in a flat bed without using bedrails?: None Help needed moving from lying on your back to sitting on the side of a flat  bed without using bedrails?: None Help needed moving to and from a bed to a chair (including a wheelchair)?: A Little Help needed standing up from a chair using your arms (e.g., wheelchair or bedside chair)?: A Little Help needed to walk in hospital room?: A Little Help needed climbing 3-5 steps with a railing? : A Lot 6 Click Score: 19    End of Session Equipment Utilized During Treatment: Gait belt Activity Tolerance: Patient tolerated treatment well Patient left: in bed;with call bell/phone within reach Nurse Communication: Mobility status PT Visit Diagnosis: Difficulty in walking, not elsewhere classified (R26.2)     Time: 7741-4239 PT Time Calculation (min) (ACUTE ONLY): 14 min  Charges:  $Gait Training: 8-22 mins                     [place] Pager [phone] Office [phone]    [name] 11/16/2020, 1:58 PM

## 2020-11-16 NOTE — Progress Notes (Signed)
PROGRESS NOTE    Anna Hoffman  ZOX:[id number] DOB: [birth year] DOA: 11/14/2020 PCP: Glendale Chard, MD   Brief Narrative:  Anna Spackman Fulpis a 86 y.o.femalewith medical history significant for diabetes mellitus type 2, hypertension, CVA presented to hospital with fatigue, lightheadedness over 1 week with 2 episodes of blood tinged emesis.  Patient does have history of erosive esophagitis.  She also complains of mild diarrhea.  Patient also complains of coughing for a week.  In the ED, patient was noted to be tachycardic and received 1500 mL of normal saline.  She was started on PPI.  Chest x-ray showed possible pneumonia and also received Rocephin and Zithromax.  Urine cultures were sent from the ED.   Assessment & Plan:   Active Problems:   Hypertension   Type 2 diabetes mellitus with stage 3 chronic kidney disease, without long-term current use of insulin ([name])   Mixed hyperlipidemia   Acute renal failure superimposed on stage 3a chronic kidney disease ([name])   Prolonged QT interval   Erosive esophagitis   CAP (community acquired pneumonia)   Dehydration   Community-acquired pneumonia On doxycycline.  Following blood cultures.  Continue to monitor closely.  Chest x-ray on presentation showed focal airspace opacity in the periphery of the right midlung suspicious for pneumonia.  Currently on room air. Improving  Type 2 diabetesmellitus with stage 3 chronic kidney disease, without long-term current use of insulin.  continue sliding scale insulin.  TSH 1.1.  Hemoglobin A1c of 6.3.  We will continue to monitor.  Diabetic diet when p.o. able.  Mixed hyperlipidemia-on Crestor at home.  Currently on hold.  Mild hypokalemia.  Will replace orally.  Check levels in a.m.  Essential hypertension -on Coreg and Norvasc at home.  On Coreg currently.  History of erosive esophagitis-could be the reason for hematemesis.  GI has been consulted.  Continue IV Protonix twice daily.     Advance diet to soft mechanical today, patient still with significant nausea with p.o. intake-we will monitor 1 additional day.  GI has seen the patient and recommended conservative treatment at this time.  Monitor hemoglobin.  Which has been stable  Mild acute kidney injury on stage 3a chronic kidney disease.  Improved with hydration.  Baseline creatinine of around 1.1.  Volume depletion.  Improved with hydration.    Start fluids gently at 75 mils per hour  Asymptomatic bacteriuria.follow-up urine culture  QT prolongation. Will monitor closely.  Monitor electrolytes.  History of CVA without residual deficit.  Was on aspirin at home.  Currently on hold.  Weakness, debility.  Will check PT, OT  DVT prophylaxis: SCD/Compression stockings  Code Status: dnr    Code Status Orders  (From admission, onward)         Start     Ordered   11/15/20 0325  Do not attempt resuscitation (DNR)  Continuous       Question Answer Comment  In the event of cardiac or respiratory ARREST Do not call a "code blue"   In the event of cardiac or respiratory ARREST Do not perform Intubation, CPR, defibrillation or ACLS   In the event of cardiac or respiratory ARREST Use medication by any route, position, wound care, and other measures to relive pain and suffering. May use oxygen, suction and manual treatment of airway obstruction as needed for comfort.      11/15/20 0324        Code Status History    Date Active Date Inactive Code Status  Order ID Comments User Context   10/09/2020 2236 10/13/2020 1843 Full Code [id number]  Reubin Milan, MD ED   08/11/2019 2129 08/12/2019 2304 Full Code [id number]  Phillips Grout, MD ED   07/11/2019 1958 07/14/2019 1644 Full Code [id number]  Bonnell Public, MD ED   11/07/2017 2254 11/10/2017 1750 Full Code [id number]  Etta Quill, DO ED   07/01/2016 1734 07/12/2016 1302 Full Code [id number]  Cathlyn Parsons, PA-C Inpatient   07/01/2016 1734 07/01/2016 1734  Full Code [id number]  Cathlyn Parsons, PA-C Inpatient   06/27/2016 2000 07/01/2016 1724 Full Code [id number]  Greta Doom, MD Inpatient   02/04/2016 1800 02/08/2016 1449 Full Code [id number]  Jani Gravel, MD Inpatient   09/01/2014 0612 09/06/2014 1408 Full Code [id number]  Alphonsa Overall, MD ED   Advance Care Planning Activity     Family Communication: ddiscussed with son by phone today  Disposition Plan:   Patient not medically stable for discharge, requiring IV fluids, antiemetics.  Will monitor and additional day to  if patient is able to tolerate p.o. intake tomorrow Consults called: None Admission status: Inpatient   Consultants:   None  Procedures:  DG Chest Port 1 View  Result Date: 11/14/2020 CLINICAL DATA:  Cough flu like symptom EXAM: PORTABLE CHEST 1 VIEW COMPARISON:  01/31/2020 FINDINGS: Left lung is clear. Focal airspace disease in the periphery of the right mid lung. Stable cardiomediastinal silhouette with aortic atherosclerosis. No pneumothorax IMPRESSION: Focal airspace disease in the periphery of the right mid lung suspicious for pneumonia. Electronically Signed   By: Donavan Foil M.D.   On: 11/14/2020 23:15     Antimicrobials:   doxy   Subjective: Patient reports nausea with p.o. intake  Objective: Vitals:   11/15/20 0259 11/15/20 0707 11/15/20 1318 11/16/20 0510  BP: (!) 148/93 (!) 159/87 (!) 148/86 138/79  Pulse: (!) 109 (!) 105 97 83  Resp: 18 16 18 20   Temp: 97.9 F (36.6 C) 98 F (36.7 C) 98.3 F (36.8 C) 98.1 F (36.7 C)  TempSrc: Oral Oral Oral Oral  SpO2: 98% 95% 92% 93%  Weight:      Height:        Intake/Output Summary (Last 24 hours) at 11/16/2020 1515 Last data filed at 11/16/2020 0152 Gross per 24 hour  Intake 300 ml  Output 300 ml  Net 0 ml   Filed Weights   11/14/20 1705  Weight: 66 kg    Examination:  General exam: Appears calm and comfortable  Respiratory system: Clear to auscultation. Respiratory effort  normal. Cardiovascular system: S1 & S2 heard, RRR. No JVD, murmurs, rubs, gallops or clicks. No pedal edema. Gastrointestinal system: Abdomen is nondistended, soft and mildly tender but nonfocal no organomegaly or masses felt. Normal bowel sounds heard. Central nervous system: Alert and oriented. No focal neurological deficits. Extremities: Warm well perfused neurovascularly intact Skin: No rashes, lesions or ulcers Psychiatry: Judgement and insight appear normal. Mood & affect appropriate.     Data Reviewed: I have personally reviewed following labs and imaging studies  CBC: Recent Labs  Lab 11/14/20 1723 11/15/20 0235 11/15/20 0405 11/15/20 1308  WBC 11.0* 13.4* 11.8* 12.3*  NEUTROABS 7.9*  --  8.7*  --   HGB 12.8 11.4* 12.2 12.7  HCT 38.4 35.5* 37.0 38.8  MCV 89.9 91.3 90.9 91.7  PLT 500* 442* 410* 390*   Basic Metabolic Panel: Recent Labs  Lab 11/14/20 1723 11/15/20 0405 11/16/20 0326  NA 138 137 138  K 3.9 3.2* 3.6  CL 111 110 113*  CO2 15* 16* 15*  GLUCOSE 110* 99 108*  BUN 24* 19 15  CREATININE 1.21* 0.83 0.73  CALCIUM 9.4 8.7* 8.9  MG  --  1.8 1.7  PHOS  --  3.2 2.8   GFR: Estimated Creatinine Clearance: 48.6 mL/min (by C-G formula based on SCr of 0.73 mg/dL). Liver Function Tests: Recent Labs  Lab 11/14/20 1723 11/15/20 0405  AST 24 15  ALT 14 14  ALKPHOS 69 54  BILITOT 1.3* 0.6  PROT 7.9 6.7  ALBUMIN 3.6 3.1*   No results for input(s): LIPASE, AMYLASE in the last 168 hours. No results for input(s): AMMONIA in the last 168 hours. Coagulation Profile: No results for input(s): INR, PROTIME in the last 168 hours. Cardiac Enzymes: No results for input(s): CKTOTAL, CKMB, CKMBINDEX, TROPONINI in the last 168 hours. BNP (last 3 results) No results for input(s): PROBNP in the last 8760 hours. HbA1C: Recent Labs    11/15/20 0235  HGBA1C 6.3*   CBG: Recent Labs  Lab 11/15/20 2027 11/16/20 0011 11/16/20 0512 11/16/20 0804 11/16/20 1211   GLUCAP 163* 100* 108* 107* 112*   Lipid Profile: No results for input(s): CHOL, HDL, LDLCALC, TRIG, CHOLHDL, LDLDIRECT in the last 72 hours. Thyroid Function Tests: Recent Labs    11/15/20 0405  TSH 1.012   Anemia Panel: No results for input(s): VITAMINB12, FOLATE, FERRITIN, TIBC, IRON, RETICCTPCT in the last 72 hours. Sepsis Labs: Recent Labs  Lab 11/15/20 0227 11/15/20 0235  PROCALCITON  --  <0.10  LATICACIDVEN 1.1  --     Recent Results (from the past 240 hour(s))  Urine culture     Status: Abnormal (Preliminary result)   Collection Time: 11/14/20  9:00 PM   Specimen: Urine, Random  Result Value Ref Range Status   Specimen Description   Final    URINE, RANDOM Performed at [facility] [address], [place], [place] [address]    Special Requests   Final    NONE Performed at [facility], [place] [address], [place], [place] [address]    Culture (A)  Final    >=100,000 COLONIES/mL GRAM NEGATIVE RODS IDENTIFICATION AND SUSCEPTIBILITIES TO FOLLOW Performed at [facility], [place] [address] [place], [place] [address]    Report Status PENDING  Incomplete  Resp Panel by RT-PCR (Flu A&B, Covid) Nasopharyngeal Swab     Status: None   Collection Time: 11/14/20 10:40 PM   Specimen: Nasopharyngeal Swab; Nasopharyngeal(NP) swabs in vial transport medium  Result Value Ref Range Status   SARS Coronavirus 2 by RT PCR NEGATIVE NEGATIVE Final    Comment: (NOTE) SARS-CoV-2 target nucleic acids are NOT DETECTED.  The SARS-CoV-2 RNA is generally detectable in upper respiratory specimens during the acute phase of infection. The lowest concentration of SARS-CoV-2 viral copies this assay can detect is 138 copies/mL. A negative result does not preclude SARS-Cov-2 infection and should not be used as the sole basis for treatment or other patient management decisions. A negative result may occur with  improper specimen collection/handling,  submission of specimen other than nasopharyngeal swab, presence of viral mutation(s) within the areas targeted by this assay, and inadequate number of viral copies(<138 copies/mL). A negative result must be combined with clinical observations, patient history, and epidemiological information. The expected result is Negative.  Fact Sheet for Patients:  EntrepreneurPulse.com.au  Fact Sheet for Healthcare Providers:  IncredibleEmployment.be  This test is no t yet  approved or cleared by the [place] and  has been authorized for detection and/or diagnosis of SARS-CoV-2 by FDA under an Emergency Use Authorization (EUA). This EUA will remain  in effect (meaning this test can be used) for the duration of the COVID-19 declaration under Section 564(b)(1) of the Act, 21 U.S.C.section 360bbb-3(b)(1), unless the authorization is terminated  or revoked sooner.       Influenza A by PCR NEGATIVE NEGATIVE Final   Influenza B by PCR NEGATIVE NEGATIVE Final    Comment: (NOTE) The Xpert Xpress SARS-CoV-2/FLU/RSV plus assay is intended as an aid in the diagnosis of influenza from Nasopharyngeal swab specimens and should not be used as a sole basis for treatment. Nasal washings and aspirates are unacceptable for Xpert Xpress SARS-CoV-2/FLU/RSV testing.  Fact Sheet for Patients: EntrepreneurPulse.com.au  Fact Sheet for Healthcare Providers: IncredibleEmployment.be  This test is not yet approved or cleared by the [place] FDA and has been authorized for detection and/or diagnosis of SARS-CoV-2 by FDA under an Emergency Use Authorization (EUA). This EUA will remain in effect (meaning this test can be used) for the duration of the COVID-19 declaration under Section 564(b)(1) of the Act, 21 U.S.C. section 360bbb-3(b)(1), unless the authorization is terminated or revoked.  Performed at [facility], [place] [address], [place], [place] [address]   Culture, blood (x 2)     Status: None (Preliminary result)   Collection Time: 11/15/20  2:35 AM   Specimen: BLOOD  Result Value Ref Range Status   Specimen Description   Final    BLOOD BLOOD RIGHT FOREARM Performed at [facility], [place] [address], [place], [place] [address]    Special Requests   Final    BOTTLES DRAWN AEROBIC ONLY Blood Culture adequate volume Performed at [place] [address], [place], [place] [address]    Culture   Final    NO GROWTH 1 DAY Performed at [facility], [place] [address], [place], [place] [address]    Report Status PENDING  Incomplete  Culture, blood (x 2)     Status: None (Preliminary result)   Collection Time: 11/15/20  2:35 AM   Specimen: BLOOD  Result Value Ref Range Status   Specimen Description   Final    BLOOD BLOOD RIGHT HAND Performed at [place] [address] [place], [place] [address]    Special Requests   Final    BOTTLES DRAWN AEROBIC ONLY Blood Culture adequate volume Performed at [place] [address], [place], [place] [address]    Culture   Final    NO GROWTH 1 DAY Performed at [facility], [place] [address] [place], [place] [address]    Report Status PENDING  Incomplete         Radiology Studies: DG Chest Port 1 View  Result Date: 11/14/2020 CLINICAL DATA:  Cough flu like symptom EXAM: PORTABLE CHEST 1 VIEW COMPARISON:  01/31/2020 FINDINGS: Left lung is clear. Focal airspace disease in the periphery of the right mid lung. Stable cardiomediastinal silhouette with aortic atherosclerosis. No pneumothorax IMPRESSION: Focal airspace disease in the periphery of the right mid lung suspicious for pneumonia. Electronically Signed   By: Donavan Foil M.D.   On: 11/14/2020 23:15        Scheduled Meds: . carvedilol  3.125 mg Oral BID WC  . doxycycline  100 mg Oral Q12H  .  feeding supplement  1  Container Oral Q24H  . feeding supplement  237 mL Oral BID BM  . insulin aspart  0-9 Units Subcutaneous Q4H  . multivitamin with minerals  1 tablet Oral Daily  . pantoprazole (PROTONIX) IV  40 mg Intravenous Q12H  . sucralfate  1 g Oral TID BM   Continuous Infusions:   LOS: 1 day    Time spent: 35 min    Nicolette Bang, MD [facility]  If 7PM-7AM, please contact night-coverage  11/16/2020, 3:15 PM

## 2020-11-17 DIAGNOSIS — N1831 Chronic kidney disease, stage 3a: Secondary | ICD-10-CM

## 2020-11-17 DIAGNOSIS — N179 Acute kidney failure, unspecified: Secondary | ICD-10-CM

## 2020-11-17 LAB — CBC WITH DIFFERENTIAL/PLATELET
Abs Immature Granulocytes: 0.13 10*3/uL — ABNORMAL HIGH (ref 0.00–0.07)
Basophils Absolute: 0.1 10*3/uL (ref 0.0–0.1)
Basophils Relative: 1 %
Eosinophils Absolute: 0.1 10*3/uL (ref 0.0–0.5)
Eosinophils Relative: 1 %
HCT: 31.1 % — ABNORMAL LOW (ref 36.0–46.0)
Hemoglobin: 10.3 g/dL — ABNORMAL LOW (ref 12.0–15.0)
Immature Granulocytes: 1 %
Lymphocytes Relative: 24 %
Lymphs Abs: 2.4 10*3/uL (ref 0.7–4.0)
MCH: 29.7 pg (ref 26.0–34.0)
MCHC: 33.1 g/dL (ref 30.0–36.0)
MCV: 89.6 fL (ref 80.0–100.0)
Monocytes Absolute: 0.8 10*3/uL (ref 0.1–1.0)
Monocytes Relative: 9 %
Neutro Abs: 6.4 10*3/uL (ref 1.7–7.7)
Neutrophils Relative %: 64 %
Platelets: 438 10*3/uL — ABNORMAL HIGH (ref 150–400)
RBC: 3.47 MIL/uL — ABNORMAL LOW (ref 3.87–5.11)
RDW: 14.8 % (ref 11.5–15.5)
WBC: 9.9 10*3/uL (ref 4.0–10.5)
nRBC: 0 % (ref 0.0–0.2)

## 2020-11-17 LAB — BASIC METABOLIC PANEL
Anion gap: 8 (ref 5–15)
BUN: 15 mg/dL (ref 8–23)
CO2: 18 mmol/L — ABNORMAL LOW (ref 22–32)
Calcium: 8.6 mg/dL — ABNORMAL LOW (ref 8.9–10.3)
Chloride: 112 mmol/L — ABNORMAL HIGH (ref 98–111)
Creatinine, Ser: 0.92 mg/dL (ref 0.44–1.00)
GFR, Estimated: >60 mL/min (ref >60)
Glucose, Bld: 122 mg/dL — ABNORMAL HIGH (ref 70–99)
Potassium: 3.2 mmol/L — ABNORMAL LOW (ref 3.5–5.1)
Sodium: 138 mmol/L (ref 135–145)

## 2020-11-17 LAB — URINE CULTURE: Culture: >=100000 — AB

## 2020-11-17 LAB — GLUCOSE, CAPILLARY: Glucose-Capillary: 115 mg/dL — ABNORMAL HIGH (ref 70–99)

## 2020-11-17 MED ORDER — LIP MEDEX EX OINT
TOPICAL_OINTMENT | CUTANEOUS | Status: DC | PRN
  Filled 2020-11-17: qty 7

## 2020-11-17 MED ORDER — DOXYCYCLINE HYCLATE 100 MG PO TABS: 100.0000 mg | ORAL_TABLET | Freq: Two times a day (BID) | ORAL | 0 refills | Status: AC

## 2020-11-17 MED ORDER — POTASSIUM CHLORIDE CRYS ER 20 MEQ PO TBCR
40.0000 meq | EXTENDED_RELEASE_TABLET | Freq: Once | ORAL | Status: AC
  Administered 2020-11-17: 40 meq via ORAL
  Filled 2020-11-17: qty 2

## 2020-11-17 NOTE — Plan of Care (Signed)
  Problem: Education: Goal: Ability to state activities that reduce stress will improve Outcome: Progressing   Problem: Coping: Goal: Ability to identify and develop effective coping behavior will improve Outcome: Progressing   Problem: Self-Concept: Goal: Ability to identify factors that promote anxiety will improve Outcome: Progressing Goal: Level of anxiety will decrease Outcome: Progressing Goal: Ability to modify response to factors that promote anxiety will improve Outcome: Progressing   Problem: Education: Goal: Knowledge of General Education information will improve Description: Including pain rating scale, medication(s)/side effects and non-pharmacologic comfort measures Outcome: Progressing   Problem: Health Behavior/Discharge Planning: Goal: Ability to manage health-related needs will improve Outcome: Progressing   Problem: Clinical Measurements: Goal: Ability to maintain clinical measurements within normal limits will improve Outcome: Progressing Goal: Will remain free from infection Outcome: Progressing Goal: Diagnostic test results will improve Outcome: Progressing Goal: Respiratory complications will improve Outcome: Progressing Goal: Cardiovascular complication will be avoided Outcome: Progressing   Problem: Activity: Goal: Risk for activity intolerance will decrease Outcome: Progressing   Problem: Nutrition: Goal: Adequate nutrition will be maintained Outcome: Progressing   Problem: Coping: Goal: Level of anxiety will decrease Outcome: Progressing   Problem: Elimination: Goal: Will not experience complications related to bowel motility Outcome: Progressing Goal: Will not experience complications related to urinary retention Outcome: Progressing   Problem: Pain Managment: Goal: General experience of comfort will improve Outcome: Progressing   Problem: Safety: Goal: Ability to remain free from injury will improve Outcome: Progressing    Problem: Skin Integrity: Goal: Risk for impaired skin integrity will decrease Outcome: Progressing   Problem: Activity: Goal: Ability to tolerate increased activity will improve Outcome: Progressing   Problem: Clinical Measurements: Goal: Ability to maintain a body temperature in the normal range will improve Outcome: Progressing   Problem: Respiratory: Goal: Ability to maintain adequate ventilation will improve Outcome: Progressing Goal: Ability to maintain a clear airway will improve Outcome: Progressing

## 2020-11-17 NOTE — Plan of Care (Signed)
Problem: Education: Goal: Ability to state activities that reduce stress will improve 11/17/2020 0038 by Terrence Dupont, RN Outcome: Progressing 11/17/2020 0038 by Terrence Dupont, RN Outcome: Progressing   Problem: Coping: Goal: Ability to identify and develop effective coping behavior will improve 11/17/2020 0038 by Terrence Dupont, RN Outcome: Progressing 11/17/2020 0038 by Terrence Dupont, RN Outcome: Progressing   Problem: Self-Concept: Goal: Ability to identify factors that promote anxiety will improve 11/17/2020 0038 by Terrence Dupont, RN Outcome: Progressing 11/17/2020 0038 by Terrence Dupont, RN Outcome: Progressing Goal: Level of anxiety will decrease 11/17/2020 0038 by Terrence Dupont, RN Outcome: Progressing 11/17/2020 0038 by Terrence Dupont, RN Outcome: Progressing Goal: Ability to modify response to factors that promote anxiety will improve 11/17/2020 0038 by Terrence Dupont, RN Outcome: Progressing 11/17/2020 0038 by Terrence Dupont, RN Outcome: Progressing   Problem: Education: Goal: Knowledge of General Education information will improve Description: Including pain rating scale, medication(s)/side effects and non-pharmacologic comfort measures 11/17/2020 0038 by Terrence Dupont, RN Outcome: Progressing 11/17/2020 0038 by Terrence Dupont, RN Outcome: Progressing   Problem: Health Behavior/Discharge Planning: Goal: Ability to manage health-related needs will improve 11/17/2020 0038 by Terrence Dupont, RN Outcome: Progressing 11/17/2020 0038 by Terrence Dupont, RN Outcome: Progressing   Problem: Clinical Measurements: Goal: Ability to maintain clinical measurements within normal limits will improve 11/17/2020 0038 by Terrence Dupont, RN Outcome: Progressing 11/17/2020 0038 by Terrence Dupont, RN Outcome: Progressing Goal: Will remain free from infection 11/17/2020 0038 by Terrence Dupont, RN Outcome: Progressing 11/17/2020 0038 by Terrence Dupont, RN Outcome: Progressing Goal: Diagnostic test  results will improve 11/17/2020 0038 by Terrence Dupont, RN Outcome: Progressing 11/17/2020 0038 by Terrence Dupont, RN Outcome: Progressing Goal: Respiratory complications will improve 11/17/2020 0038 by Terrence Dupont, RN Outcome: Progressing 11/17/2020 0038 by Terrence Dupont, RN Outcome: Progressing Goal: Cardiovascular complication will be avoided 11/17/2020 0038 by Terrence Dupont, RN Outcome: Progressing 11/17/2020 0038 by Terrence Dupont, RN Outcome: Progressing   Problem: Activity: Goal: Risk for activity intolerance will decrease 11/17/2020 0038 by Terrence Dupont, RN Outcome: Progressing 11/17/2020 0038 by Terrence Dupont, RN Outcome: Progressing   Problem: Nutrition: Goal: Adequate nutrition will be maintained 11/17/2020 0038 by Terrence Dupont, RN Outcome: Progressing 11/17/2020 0038 by Terrence Dupont, RN Outcome: Progressing   Problem: Coping: Goal: Level of anxiety will decrease 11/17/2020 0038 by Terrence Dupont, RN Outcome: Progressing 11/17/2020 0038 by Terrence Dupont, RN Outcome: Progressing   Problem: Elimination: Goal: Will not experience complications related to bowel motility 11/17/2020 0038 by Terrence Dupont, RN Outcome: Progressing 11/17/2020 0038 by Terrence Dupont, RN Outcome: Progressing Goal: Will not experience complications related to urinary retention 11/17/2020 0038 by Terrence Dupont, RN Outcome: Progressing 11/17/2020 0038 by Terrence Dupont, RN Outcome: Progressing   Problem: Pain Managment: Goal: General experience of comfort will improve 11/17/2020 0038 by Terrence Dupont, RN Outcome: Progressing 11/17/2020 0038 by Terrence Dupont, RN Outcome: Progressing   Problem: Safety: Goal: Ability to remain free from injury will improve 11/17/2020 0038 by Terrence Dupont, RN Outcome: Progressing 11/17/2020 0038 by Terrence Dupont, RN Outcome: Progressing   Problem: Skin Integrity: Goal: Risk for impaired skin integrity will decrease 11/17/2020 0038 by Terrence Dupont, RN Outcome:  Progressing 11/17/2020 0038 by Terrence Dupont, RN Outcome: Progressing   Problem: Activity: Goal: Ability to tolerate increased activity will improve 11/17/2020 0038 by Terrence Dupont, RN Outcome: Progressing 11/17/2020 0038 by Terrence Dupont, RN Outcome: Progressing   Problem: Clinical Measurements: Goal: Ability to maintain a body temperature in the normal range will improve 11/17/2020 0038 by Terrence Dupont, RN  Outcome: Progressing 11/17/2020 0038 by Terrence Dupont, RN Outcome: Progressing   Problem: Respiratory: Goal: Ability to maintain adequate ventilation will improve 11/17/2020 0038 by Terrence Dupont, RN Outcome: Progressing 11/17/2020 0038 by Terrence Dupont, RN Outcome: Progressing Goal: Ability to maintain a clear airway will improve 11/17/2020 0038 by Terrence Dupont, RN Outcome: Progressing 11/17/2020 0038 by Terrence Dupont, RN Outcome: Progressing

## 2020-11-17 NOTE — Progress Notes (Signed)
Discharge instructions reviewed with patient and she is ready to leave.

## 2020-11-17 NOTE — TOC Progression Note (Signed)
Transition of Care [facility]) - Progression Note    Patient Details  Name: Anna Hoffman MRN: [id number] Date of Birth: [birth year]  Transition of Care [facility]) CM/SW Contact  Joaquin Courts, RN Phone Number: 11/17/2020, 11:10 AM  Clinical Narrative:    CM noted patient previously active with [facility] for [place].  CM spoke with rep Cyndie and confirmed Alvis Lemmings can service patient again.    Expected Discharge Plan: [place] Barriers to Discharge: No Barriers Identified  Expected Discharge Plan and Services Expected Discharge Plan: [place]   Discharge Planning Services: CM Consult Post Acute Care Choice: [place] arrangements for the past 2 months: Single Family Home Expected Discharge Date: 11/17/20                         HH Arranged: PT HH Agency: [place] Date [place]: 11/17/20 Time HH Agency Contacted: 1107 Representative spoke with at [place]: Cindie   Social Determinants of Health ([place]) Interventions    Readmission Risk Interventions No flowsheet data found.

## 2020-11-17 NOTE — Discharge Summary (Signed)
Physician Discharge Summary  Anna Hoffman [facility] PZW:[id number] DOB: 1935-02-02 DOA: 11/14/2020  PCP: Glendale Chard, MD  Admit date: 11/14/2020 Discharge date: 11/17/2020  Admitted From: Inpatient Disposition: home  Recommendations for Outpatient Follow-up:  1. Follow up with PCP in 1-2 weeks   Home Health:No Equipment/Devices:no new equip  Discharge Condition:Stable CODE STATUS:DNR Diet recommendation: Diabetic diet  Brief/Interim Summary: Shresta Risden Fulpis a 86 y.o.femalewith medical history significantfor diabetes mellitus type 2, hypertension, CVA presented to hospital with fatigue,lightheadedness over 1 week with 2 episodes of blood tinged emesis. Patient does have history of erosive esophagitis. She also complains of mild diarrhea. Patient also complains of coughing for a week. In the ED,patient was noted to be tachycardic and received 1500 mL of normal saline. She was started on PPI. Chest x-ray showed possible pneumonia and also received Rocephin and Zithromax. Urine cultures were sent from the ED.  Hospital course: Community-acquired pneumonia On doxycycline and ceftriaxone while in the hospital.  Will complete a course of antibiotics on discharge. Cultures are negativecontinue to monitor closely.Chest x-ray on presentation showed focal airspace opacity in the periphery of the right midlung suspicious for pneumonia. Currently on room air. Improving  Type 2 diabetesmellitus with stage 3 chronic kidney disease, without long-term current use of insulin.continue sliding scale insulin. TSH 1.1. Hemoglobin A1c of 6.3. Diabetic diet when p.o. able.  Mixed hyperlipidemia-on Crestor at home. Continue on discharge  Mild hypokalemia. Repleted  Essential hypertension -on Coreg and Norvasc at home. To new home meds  History of erosive esophagitis-could be the reason for hematemesis. GI has been consulted. Continue IV Protonix twice daily and will continue  p.o. twice daily on discharge.   Advanced diet to soft mechanical which patient toleratedGI has seen the patient and recommended conservative treatment at this time.   Mild acute kidney injury onstage 3a chronic kidney disease. Improved with hydration. Baseline creatinine of around 1.1.  Volume depletion. Improved with hydration.    Asymptomatic bacteriuria.follow-up urine culture, did show gram-negative rods for which patient was treated with 3 days of antibiotics in the hospital and will complete a course as an outpatient count is normal patient is afebrile and hemodynamically stable  QT prolongation.Will monitorclosely. Monitor electrolytes.  History of CVA without residual deficit. Was on aspirin at home.   Discharge Diagnoses:  Active Problems:   Hypertension   Type 2 diabetes mellitus with stage 3 chronic kidney disease, without long-term current use of insulin ([name])   Mixed hyperlipidemia   Acute renal failure superimposed on stage 3a chronic kidney disease ([name])   Prolonged QT interval   Erosive esophagitis   CAP (community acquired pneumonia)   Dehydration    Discharge Instructions  Discharge Instructions    Call MD for:   Complete by: As directed    For any acute change in medical condition to include but not limited to nausea vomiting fevers chills bleeding chest pain or shortness of breath   Diet Carb Modified   Complete by: As directed    Increase activity slowly   Complete by: As directed      Allergies as of 11/17/2020      Reactions   Amoxicillin Diarrhea   Has patient had a PCN reaction causing immediate rash, facial/tongue/throat swelling, SOB or lightheadedness with hypotension: no Has patient had a PCN reaction causing severe rash involving mucus membranes or skin necrosis: no Has patient had a PCN reaction that required hospitalization: no pharmacist consult Has patient had a PCN reaction occurring within the  last 10 years: yes If  all of the above answers are "NO", then may proceed with Cephalosporin use.   Ampicillin Rash   - Tolerates Rocephin and Ancef - Remote occurrence; no symptoms of anaphylaxis or severe cutaneous reaction, and no additional medical attention required   Sulfa Antibiotics Rash      Medication List    TAKE these medications   acetaminophen 325 MG tablet Commonly known as: TYLENOL Take 2 tablets (650 mg total) by mouth every 6 (six) hours as needed for mild pain (or Fever >/= 101).   ALLEGRA PO Take 180 mg by mouth at bedtime.   amLODipine 10 MG tablet Commonly known as: NORVASC TAKE 1 TABLET BY MOUTH ONCE DAILY What changed: how much to take   azelastine 0.1 % nasal spray Commonly known as: ASTELIN Place 2 sprays into both nostrils daily as needed for rhinitis.   benzonatate 100 MG capsule Commonly known as: Tessalon Perles Take 1 capsule (100 mg total) by mouth 3 (three) times daily as needed for cough.   carvedilol 3.125 MG tablet Commonly known as: COREG Take 1 tablet (3.125 mg total) by mouth 2 (two) times daily with a meal.   doxycycline 100 MG tablet Commonly known as: VIBRA-TABS Take 1 tablet (100 mg total) by mouth every 12 (twelve) hours for 6 days.   LUBRICATING EYE DROPS OP Apply 1 drop to eye daily as needed (dry eyes).   onetouch ultrasoft lancets Use as instructed to check blood sugar 2 times per day dx code e11.65   OneTouch Verio test strip Generic drug: glucose blood Use as instructed to check blood sugar 2 times per day dx: e11.65   pantoprazole 40 MG tablet Commonly known as: PROTONIX Take 1 tablet (40 mg total) by mouth 2 (two) times daily before a meal.   rosuvastatin 20 MG tablet Commonly known as: CRESTOR TAKE 1 TABLET BY MOUTH ONCE DAILY   sucralfate 1 GM/10ML suspension Commonly known as: CARAFATE Take 10 mLs (1 g total) by mouth 4 (four) times daily -  with meals and at bedtime for 28 days.   trolamine salicylate 10 % cream Commonly  known as: ASPERCREME Apply 1 application topically daily as needed for muscle pain.   vitamin B-12 100 MCG tablet Commonly known as: CYANOCOBALAMIN Take 100 mcg by mouth daily.   vitamin C 1000 MG tablet Take 1,000 mg by mouth daily.       Allergies  Allergen Reactions  . Amoxicillin Diarrhea    Has patient had a PCN reaction causing immediate rash, facial/tongue/throat swelling, SOB or lightheadedness with hypotension: no Has patient had a PCN reaction causing severe rash involving mucus membranes or skin necrosis: no Has patient had a PCN reaction that required hospitalization: no pharmacist consult Has patient had a PCN reaction occurring within the last 10 years: yes If all of the above answers are "NO", then may proceed with Cephalosporin use.   . Ampicillin Rash    - Tolerates Rocephin and Ancef - Remote occurrence; no symptoms of anaphylaxis or severe cutaneous reaction, and no additional medical attention required    . Sulfa Antibiotics Rash    Consultations:  GI   Procedures/Studies: DG Chest Port 1 View  Result Date: 11/14/2020 CLINICAL DATA:  Cough flu like symptom EXAM: PORTABLE CHEST 1 VIEW COMPARISON:  01/31/2020 FINDINGS: Left lung is clear. Focal airspace disease in the periphery of the right mid lung. Stable cardiomediastinal silhouette with aortic atherosclerosis. No pneumothorax IMPRESSION: Focal airspace disease  in the periphery of the right mid lung suspicious for pneumonia. Electronically Signed   By: Donavan Foil M.D.   On: 11/14/2020 23:15       Subjective:   Discharge Exam: Vitals:   11/17/20 0342 11/17/20 0806  BP: (!) 158/94 (!) 145/82  Pulse: 86 87  Resp: 20   Temp: 98.2 F (36.8 C)   SpO2: 98%    Vitals:   11/16/20 0510 11/16/20 2122 11/17/20 0342 11/17/20 0806  BP: 138/79 (!) 147/75 (!) 158/94 (!) 145/82  Pulse: 83 90 86 87  Resp: 20 18 20    Temp: 98.1 F (36.7 C) 98.2 F (36.8 C) 98.2 F (36.8 C)   TempSrc: Oral Oral  Oral   SpO2: 93% 97% 98%   Weight:      Height:        General: Pt is alert, awake, not in acute distress Cardiovascular: RRR, S1/S2 +, no rubs, no gallops Respiratory: CTA bilaterally, no wheezing, no rhonchi Abdominal: Soft, NT, ND, bowel sounds + Extremities: no edema, no cyanosis    The results of significant diagnostics from this hospitalization (including imaging, microbiology, ancillary and laboratory) are listed below for reference.     Microbiology: Recent Results (from the past 240 hour(s))  Culture, Urine     Status: Abnormal   Collection Time: 11/13/20  1:00 AM   Specimen: Urine   UR  Result Value Ref Range Status   Urine Culture, Routine Final report (A)  Final   Organism ID, Bacteria Comment (A)  Final    Comment: Enterobacter cloacae complex Greater than 100,000 colony forming units per mL    Antimicrobial Susceptibility Comment  Final    Comment:       ** S = Susceptible; I = Intermediate; R = Resistant **                    P = Positive; N = Negative             MICS are expressed in micrograms per mL    Antibiotic                 RSLT#1    RSLT#2    RSLT#3    RSLT#4 Amoxicillin/Clavulanic Acid    R Cefazolin                      R Cefepime                       S Cefuroxime                     R Ciprofloxacin                  S Gentamicin                     S Imipenem                       S Levofloxacin                   S Meropenem                      S Nitrofurantoin                 S Tetracycline  S Tobramycin                     S Trimethoprim/Sulfa             S   Urine culture     Status: Abnormal (Preliminary result)   Collection Time: 11/14/20  9:00 PM   Specimen: Urine, Random  Result Value Ref Range Status   Specimen Description   Final    URINE, RANDOM Performed at [facility], [place] [address], [place], [place] [address]    Special Requests   Final    NONE Performed at [facility], [place] [address] [place], [place] [address]    Culture (A)  Final    >=100,000 COLONIES/mL GRAM NEGATIVE RODS IDENTIFICATION AND SUSCEPTIBILITIES TO FOLLOW Performed at [facility], [place] [address], [place], [place] [address]    Report Status PENDING  Incomplete  Resp Panel by RT-PCR (Flu A&B, Covid) Nasopharyngeal Swab     Status: None   Collection Time: 11/14/20 10:40 PM   Specimen: Nasopharyngeal Swab; Nasopharyngeal(NP) swabs in vial transport medium  Result Value Ref Range Status   SARS Coronavirus 2 by RT PCR NEGATIVE NEGATIVE Final    Comment: (NOTE) SARS-CoV-2 target nucleic acids are NOT DETECTED.  The SARS-CoV-2 RNA is generally detectable in upper respiratory specimens during the acute phase of infection. The lowest concentration of SARS-CoV-2 viral copies this assay can detect is 138 copies/mL. A negative result does not preclude SARS-Cov-2 infection and should not be used as the sole basis for treatment or other patient management decisions. A negative result may occur with  improper specimen collection/handling, submission of specimen other than nasopharyngeal swab, presence of viral mutation(s) within the areas targeted by this assay, and inadequate number of viral copies(<138 copies/mL). A negative result must be combined with clinical observations, patient history, and epidemiological information. The expected result is Negative.  Fact Sheet for Patients:  EntrepreneurPulse.com.au  Fact Sheet for Healthcare Providers:  IncredibleEmployment.be  This test is no t yet approved or cleared by the [place] FDA and  has been authorized for detection and/or diagnosis of SARS-CoV-2 by FDA under an Emergency Use Authorization (EUA). This EUA will remain  in effect (meaning this test can be used) for the duration of the COVID-19 declaration under Section 564(b)(1) of the Act, 21 U.S.C.section 360bbb-3(b)(1), unless  the authorization is terminated  or revoked sooner.       Influenza A by PCR NEGATIVE NEGATIVE Final   Influenza B by PCR NEGATIVE NEGATIVE Final    Comment: (NOTE) The Xpert Xpress SARS-CoV-2/FLU/RSV plus assay is intended as an aid in the diagnosis of influenza from Nasopharyngeal swab specimens and should not be used as a sole basis for treatment. Nasal washings and aspirates are unacceptable for Xpert Xpress SARS-CoV-2/FLU/RSV testing.  Fact Sheet for Patients: EntrepreneurPulse.com.au  Fact Sheet for Healthcare Providers: IncredibleEmployment.be  This test is not yet approved or cleared by the [place] FDA and has been authorized for detection and/or diagnosis of SARS-CoV-2 by FDA under an Emergency Use Authorization (EUA). This EUA will remain in effect (meaning this test can be used) for the duration of the COVID-19 declaration under Section 564(b)(1) of the Act, 21 U.S.C. section 360bbb-3(b)(1), unless the authorization is terminated or revoked.  Performed at [facility], [place] [address] [place], [place] [address]   Culture, blood (x 2)     Status: None (Preliminary result)  Collection Time: 11/15/20  2:35 AM   Specimen: BLOOD  Result Value Ref Range Status   Specimen Description   Final    BLOOD BLOOD RIGHT FOREARM Performed at [facility], [place] [address], [place], [place] [address]    Special Requests   Final    BOTTLES DRAWN AEROBIC ONLY Blood Culture adequate volume Performed at [place] [address] [place], [place] [address]    Culture   Final    NO GROWTH 1 DAY Performed at [facility], [place] [address] [place], [place] [address]    Report Status PENDING  Incomplete  Culture, blood (x 2)     Status: None (Preliminary result)   Collection Time: 11/15/20  2:35 AM   Specimen: BLOOD  Result Value Ref Range Status   Specimen Description   Final    BLOOD  BLOOD RIGHT HAND Performed at [place] [address], [place], [place] [address]    Special Requests   Final    BOTTLES DRAWN AEROBIC ONLY Blood Culture adequate volume Performed at [place] [address], [place], [place] [address]    Culture   Final    NO GROWTH 1 DAY Performed at [facility], [place] [address], [place], [place] [address]    Report Status PENDING  Incomplete     Labs: BNP (last 3 results) No results for input(s): BNP in the last 8760 hours. Basic Metabolic Panel: Recent Labs  Lab 11/14/20 1723 11/15/20 0405 11/16/20 0326 11/17/20 0323  NA 138 137 138 138  K 3.9 3.2* 3.6 3.2*  CL 111 110 113* 112*  CO2 15* 16* 15* 18*  GLUCOSE 110* 99 108* 122*  BUN 24* 19 15 15   CREATININE 1.21* 0.83 0.73 0.92  CALCIUM 9.4 8.7* 8.9 8.6*  MG  --  1.8 1.7  --   PHOS  --  3.2 2.8  --    Liver Function Tests: Recent Labs  Lab 11/14/20 1723 11/15/20 0405  AST 24 15  ALT 14 14  ALKPHOS 69 54  BILITOT 1.3* 0.6  PROT 7.9 6.7  ALBUMIN 3.6 3.1*   No results for input(s): LIPASE, AMYLASE in the last 168 hours. No results for input(s): AMMONIA in the last 168 hours. CBC: Recent Labs  Lab 11/14/20 1723 11/15/20 0235 11/15/20 0405 11/15/20 1308 11/17/20 0323  WBC 11.0* 13.4* 11.8* 12.3* 9.9  NEUTROABS 7.9*  --  8.7*  --  6.4  HGB 12.8 11.4* 12.2 12.7 10.3*  HCT 38.4 35.5* 37.0 38.8 31.1*  MCV 89.9 91.3 90.9 91.7 89.6  PLT 500* 442* 410* 434* 438*   Cardiac Enzymes: No results for input(s): CKTOTAL, CKMB, CKMBINDEX, TROPONINI in the last 168 hours. BNP: Invalid input(s): POCBNP CBG: Recent Labs  Lab 11/16/20 0804 11/16/20 1211 11/16/20 1654 11/16/20 2123 11/17/20 0754  GLUCAP 107* 112* 174* 73 115*   D-Dimer No results for input(s): DDIMER in the last 72 hours. Hgb A1c Recent Labs    11/15/20 0235  HGBA1C 6.3*   Lipid Profile No results for input(s): CHOL, HDL, LDLCALC, TRIG, CHOLHDL,  LDLDIRECT in the last 72 hours. Thyroid function studies Recent Labs    11/15/20 0405  TSH 1.012   Anemia work up No results for input(s): VITAMINB12, FOLATE, FERRITIN, TIBC, IRON, RETICCTPCT in the last 72 hours. Urinalysis    Component Value Date/Time   COLORURINE YELLOW 11/14/2020 2100   APPEARANCEUR CLEAR 11/14/2020 2100  LABSPEC 1.020 11/14/2020 2100   PHURINE 5.0 11/14/2020 2100   GLUCOSEU NEGATIVE 11/14/2020 2100   HGBUR NEGATIVE 11/14/2020 2100   BILIRUBINUR NEGATIVE 11/14/2020 2100   BILIRUBINUR Negative 06/12/2020 1638   KETONESUR 20 (A) 11/14/2020 2100   PROTEINUR 30 (A) 11/14/2020 2100   UROBILINOGEN 0.2 06/12/2020 1638   UROBILINOGEN 0.2 09/01/2014 0300   NITRITE NEGATIVE 11/14/2020 2100   LEUKOCYTESUR SMALL (A) 11/14/2020 2100   Sepsis Labs Invalid input(s): PROCALCITONIN,  WBC,  LACTICIDVEN Microbiology Recent Results (from the past 240 hour(s))  Culture, Urine     Status: Abnormal   Collection Time: 11/13/20  1:00 AM   Specimen: Urine   UR  Result Value Ref Range Status   Urine Culture, Routine Final report (A)  Final   Organism ID, Bacteria Comment (A)  Final    Comment: Enterobacter cloacae complex Greater than 100,000 colony forming units per mL    Antimicrobial Susceptibility Comment  Final    Comment:       ** S = Susceptible; I = Intermediate; R = Resistant **                    P = Positive; N = Negative             MICS are expressed in micrograms per mL    Antibiotic                 RSLT#1    RSLT#2    RSLT#3    RSLT#4 Amoxicillin/Clavulanic Acid    R Cefazolin                      R Cefepime                       S Cefuroxime                     R Ciprofloxacin                  S Gentamicin                     S Imipenem                       S Levofloxacin                   S Meropenem                      S Nitrofurantoin                 S Tetracycline                   S Tobramycin                     S Trimethoprim/Sulfa              S   Urine culture     Status: Abnormal (Preliminary result)   Collection Time: 11/14/20  9:00 PM   Specimen: Urine, Random  Result Value Ref Range Status   Specimen Description   Final    URINE, RANDOM Performed at [facility], [place] [address], [place], [place] [address]    Special Requests   Final    NONE Performed at [facility], [place] [address] [place], [place]  [address]    Culture (A)  Final    >=100,000 COLONIES/mL GRAM NEGATIVE RODS IDENTIFICATION AND SUSCEPTIBILITIES TO FOLLOW Performed at [facility], [place] [address], [place], [place] [address]    Report Status PENDING  Incomplete  Resp Panel by RT-PCR (Flu A&B, Covid) Nasopharyngeal Swab     Status: None   Collection Time: 11/14/20 10:40 PM   Specimen: Nasopharyngeal Swab; Nasopharyngeal(NP) swabs in vial transport medium  Result Value Ref Range Status   SARS Coronavirus 2 by RT PCR NEGATIVE NEGATIVE Final    Comment: (NOTE) SARS-CoV-2 target nucleic acids are NOT DETECTED.  The SARS-CoV-2 RNA is generally detectable in upper respiratory specimens during the acute phase of infection. The lowest concentration of SARS-CoV-2 viral copies this assay can detect is 138 copies/mL. A negative result does not preclude SARS-Cov-2 infection and should not be used as the sole basis for treatment or other patient management decisions. A negative result may occur with  improper specimen collection/handling, submission of specimen other than nasopharyngeal swab, presence of viral mutation(s) within the areas targeted by this assay, and inadequate number of viral copies(<138 copies/mL). A negative result must be combined with clinical observations, patient history, and epidemiological information. The expected result is Negative.  Fact Sheet for Patients:  EntrepreneurPulse.com.au  Fact Sheet for Healthcare Providers:   IncredibleEmployment.be  This test is no t yet approved or cleared by the [place] FDA and  has been authorized for detection and/or diagnosis of SARS-CoV-2 by FDA under an Emergency Use Authorization (EUA). This EUA will remain  in effect (meaning this test can be used) for the duration of the COVID-19 declaration under Section 564(b)(1) of the Act, 21 U.S.C.section 360bbb-3(b)(1), unless the authorization is terminated  or revoked sooner.       Influenza A by PCR NEGATIVE NEGATIVE Final   Influenza B by PCR NEGATIVE NEGATIVE Final    Comment: (NOTE) The Xpert Xpress SARS-CoV-2/FLU/RSV plus assay is intended as an aid in the diagnosis of influenza from Nasopharyngeal swab specimens and should not be used as a sole basis for treatment. Nasal washings and aspirates are unacceptable for Xpert Xpress SARS-CoV-2/FLU/RSV testing.  Fact Sheet for Patients: EntrepreneurPulse.com.au  Fact Sheet for Healthcare Providers: IncredibleEmployment.be  This test is not yet approved or cleared by the [place] FDA and has been authorized for detection and/or diagnosis of SARS-CoV-2 by FDA under an Emergency Use Authorization (EUA). This EUA will remain in effect (meaning this test can be used) for the duration of the COVID-19 declaration under Section 564(b)(1) of the Act, 21 U.S.C. section 360bbb-3(b)(1), unless the authorization is terminated or revoked.  Performed at [facility], [place] [address], [place], [place] [address]   Culture, blood (x 2)     Status: None (Preliminary result)   Collection Time: 11/15/20  2:35 AM   Specimen: BLOOD  Result Value Ref Range Status   Specimen Description   Final    BLOOD BLOOD RIGHT FOREARM Performed at [facility], [place] [address], [place], [place] [address]    Special Requests   Final    BOTTLES DRAWN AEROBIC ONLY Blood Culture adequate volume Performed at  [place] [address], [place], [place] [address]    Culture   Final    NO GROWTH 1 DAY Performed at [facility], [place] [address], [place], [place] [address]    Report Status PENDING  Incomplete  Culture, blood (x 2)  Status: None (Preliminary result)   Collection Time: 11/15/20  2:35 AM   Specimen: BLOOD  Result Value Ref Range Status   Specimen Description   Final    BLOOD BLOOD RIGHT HAND Performed at [place] [address] [place], [place] [address]    Special Requests   Final    BOTTLES DRAWN AEROBIC ONLY Blood Culture adequate volume Performed at [place] [address] [place], [place] [address]    Culture   Final    NO GROWTH 1 DAY Performed at [facility], [place] [address], [place], [place] [address]    Report Status PENDING  Incomplete     Time coordinating discharge: Over 30 minutes  SIGNED:   Nicolette Bang, MD  [facility] 11/17/2020, 10:28 AM Pager   If 7PM-7AM, please contact night-coverage [email or web address] Password [email or web address]

## 2020-11-19 ENCOUNTER — Telehealth: Payer: Self-pay

## 2020-11-19 NOTE — Telephone Encounter (Signed)
  Care Management    PING Note  11/19/2020 Name: Anna Hoffman MRN: [id number] DOB: 03-29-1935  Care management team received notification of patient's recent inpatient hospitalization related to Pneumonia .Based on review of health record, [name] is currently active in the embedded care coordination program.   Plan: Collaboration with RN Care Manager to inform of recent hospitalization. Appointment scheduled for embedded SW to outreach the patient on 4.20.22.  Daneen Schick, BSW, CDP Social Worker, Certified Dementia Practitioner [place] [facility] [phone]

## 2020-11-19 NOTE — Telephone Encounter (Signed)
I called the pt to do a transition of care call and to schedule her an appt for a hospital f/u.

## 2020-11-20 ENCOUNTER — Telehealth: Payer: Self-pay

## 2020-11-20 LAB — CULTURE, BLOOD (ROUTINE X 2)
Culture: NO GROWTH
Culture: NO GROWTH
Special Requests: ADEQUATE
Special Requests: ADEQUATE

## 2020-11-20 NOTE — Telephone Encounter (Signed)
Called the pt to do a transition of care call and to schedule her an appt for a hospital f/u.

## 2020-11-20 NOTE — Telephone Encounter (Signed)
Transition Care Management Unsuccessful Follow-up Telephone Call  Date of discharge and from where:  11/17/2020 [facility]  Attempts:  2nd Attempt  Reason for unsuccessful TCM follow-up call:  Left voice message

## 2020-11-21 ENCOUNTER — Telehealth: Payer: Self-pay

## 2020-11-21 ENCOUNTER — Ambulatory Visit: Payer: Medicare Other

## 2020-11-21 DIAGNOSIS — N183 Chronic kidney disease, stage 3 unspecified: Secondary | ICD-10-CM | POA: Diagnosis not present

## 2020-11-21 DIAGNOSIS — E1122 Type 2 diabetes mellitus with diabetic chronic kidney disease: Secondary | ICD-10-CM | POA: Diagnosis not present

## 2020-11-21 DIAGNOSIS — I10 Essential (primary) hypertension: Secondary | ICD-10-CM | POA: Diagnosis not present

## 2020-11-21 NOTE — Telephone Encounter (Signed)
Transition Care Management Follow-up Telephone Call  Date of discharge and from where: 11/17/2020  How have you been since you were released from the hospital? Fine but tired  Any questions or concerns? No  Items Reviewed:  Did the pt receive and understand the discharge instructions provided? Yes  Medications obtained and verified? Yes Yes   Any new allergies since your discharge? No  Dietary orders reviewed?Yes   Do you have support at home?Yes  Other (ie: DME, Home Health, etc) No  Functional Questionnaire: (I = Independent and D = Dependent)  Bathing/Dressing- I   Meal Prep- I  Eating- I  Maintaining continence- I  Transferring/Ambulation- I  Managing Meds- I   Follow up appointments reviewed:    PCP Hospital f/u appt confirmed?Yes.  [facility] f/u appt confirmed?Yes  Are transportation arrangements needed? No  If their condition worsens, is the pt aware to call  their PCP or go to the ED? Yes  Was the patient provided with contact information for the PCP's office or ED? Yes  Was the pt encouraged to call back with questions or concerns?Yes

## 2020-11-21 NOTE — Patient Instructions (Signed)
Social Worker Visit Information  Goals we discussed today:  Goals Addressed            This Visit's Progress   . Work with SW to manage care coordination needs       Timeframe:  Long-Range Goal Priority:  Low Start Date:    1.7.22                         Expected End Date:  5.7.22                     Next planned outreach date: 5.10.22   Patient Goals/Self-Care Activities Over the next 60 days, patient will:   - Patient will self administer medications as prescribed -Patient will attend all scheduled provider appointments -Contact SW as needed prior to next scheduled call       Follow Up Plan: SW will follow up with patient by phone over the next month   Daneen Schick, BSW, CDP Social Worker, Certified Dementia Practitioner [place] [facility] [facility] [phone]

## 2020-11-21 NOTE — Chronic Care Management (AMB) (Signed)
Chronic Care Management    Social Work Note  11/21/2020 Name: Anna Hoffman MRN: [id number] DOB: 30-Oct-1934  Anna Hoffman is a 86 y.o. year old female who is a primary care patient of Glendale Chard, MD. The CCM team was consulted to assist the patient with chronic disease management and/or care coordination needs related to: [employer] .   Engaged with patient by telephone for follow up visit in response to provider referral for social work chronic care management and care coordination services.   Consent to Services:  The patient was given information about Chronic Care Management services, agreed to services, and gave verbal consent prior to initiation of services.  Please see initial visit note for detailed documentation.   Patient agreed to services and consent obtained.   Assessment: Review of patient past medical history, allergies, medications, and health status, including review of relevant consultants reports was performed today as part of a comprehensive evaluation and provision of chronic care management and care coordination services.     SDOH (Social Determinants of Health) assessments and interventions performed:    Advanced Directives Status: Not addressed in this encounter.  CCM Care Plan  Allergies  Allergen Reactions  . Amoxicillin Diarrhea    Has patient had a PCN reaction causing immediate rash, facial/tongue/throat swelling, SOB or lightheadedness with hypotension: no Has patient had a PCN reaction causing severe rash involving mucus membranes or skin necrosis: no Has patient had a PCN reaction that required hospitalization: no pharmacist consult Has patient had a PCN reaction occurring within the last 10 years: yes If all of the above answers are "NO", then may proceed with Cephalosporin use.   . Ampicillin Rash    - Tolerates Rocephin and Ancef - Remote occurrence; no symptoms of anaphylaxis or severe cutaneous reaction, and no additional medical  attention required    . Sulfa Antibiotics Rash    Outpatient Encounter Medications as of 11/21/2020  Medication Sig  . acetaminophen (TYLENOL) 325 MG tablet Take 2 tablets (650 mg total) by mouth every 6 (six) hours as needed for mild pain (or Fever >/= 101).  Marland Kitchen amLODipine (NORVASC) 10 MG tablet TAKE 1 TABLET BY MOUTH ONCE DAILY (Patient taking differently: Take 10 mg by mouth daily.)  . Ascorbic Acid (VITAMIN C) 1000 MG tablet Take 1,000 mg by mouth daily.  Marland Kitchen azelastine (ASTELIN) 0.1 % nasal spray Place 2 sprays into both nostrils daily as needed for rhinitis.  Marland Kitchen benzonatate (TESSALON PERLES) 100 MG capsule Take 1 capsule (100 mg total) by mouth 3 (three) times daily as needed for cough. (Patient not taking: No sig reported)  . Carboxymethylcellul-Glycerin (LUBRICATING EYE DROPS OP) Apply 1 drop to eye daily as needed (dry eyes).   . carvedilol (COREG) 3.125 MG tablet Take 1 tablet (3.125 mg total) by mouth 2 (two) times daily with a meal.  . doxycycline (VIBRA-TABS) 100 MG tablet Take 1 tablet (100 mg total) by mouth every 12 (twelve) hours for 6 days.  Marland Kitchen Fexofenadine HCl (ALLEGRA PO) Take 180 mg by mouth at bedtime.   Marland Kitchen glucose blood (ONETOUCH VERIO) test strip Use as instructed to check blood sugar 2 times per day dx: e11.65  . Lancets (ONETOUCH ULTRASOFT) lancets Use as instructed to check blood sugar 2 times per day dx code e11.65  . pantoprazole (PROTONIX) 40 MG tablet Take 1 tablet (40 mg total) by mouth 2 (two) times daily before a meal.  . rosuvastatin (CRESTOR) 20 MG tablet TAKE 1 TABLET BY  MOUTH ONCE DAILY (Patient not taking: No sig reported)  . sucralfate (CARAFATE) 1 GM/10ML suspension Take 10 mLs (1 g total) by mouth 4 (four) times daily -  with meals and at bedtime for 28 days.  Marland Kitchen trolamine salicylate (ASPERCREME) 10 % cream Apply 1 application topically daily as needed for muscle pain.  . vitamin B-12 (CYANOCOBALAMIN) 100 MCG tablet Take 100 mcg by mouth daily.  .  [DISCONTINUED] omeprazole (PRILOSEC) 20 MG capsule TAKE 1 CAPSULE BY MOUTH ONCE DAILY 30 MINUTES TO 1 HOUR BEFORE A MEAL (Patient taking differently: Take 20 mg by mouth daily.)   No facility-administered encounter medications on file as of 11/21/2020.    Patient Active Problem List   Diagnosis Date Noted  . CAP (community acquired pneumonia) 11/15/2020  . Dehydration 11/15/2020  . Erosive esophagitis   . Hiatal hernia   . Hyponatremia 10/10/2020  . Acute kidney injury superimposed on chronic kidney disease ([place]) 10/10/2020  . Aortic atherosclerosis ([place]) 10/09/2020  . Acute renal failure superimposed on stage 3a chronic kidney disease ([place]) 10/09/2020  . Hyperkalemia 10/09/2020  . Hearing loss 10/09/2020  . Prolonged QT interval 10/09/2020  . Colitis, acute 08/11/2019  . Colitis 07/11/2019  . Loose stools 06/07/2019  . Herpes zoster without complication 09/32/3557  . Other long term (current) drug therapy 08/12/2018  . Chronic renal disease, stage III ([place]) 07/19/2018  . Hypertensive nephropathy 07/19/2018  . Community acquired pneumonia of right upper lobe of lung 11/07/2017  . Hypertensive emergency 07/01/2016  . Thalamic hemorrhage ([place]) 07/01/2016  . 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 Benign essential HTN   . Type 2 diabetes mellitus with stage 3 chronic kidney disease, without long-term current use of insulin ([place])   . Mixed hyperlipidemia   . ICH (intracerebral hemorrhage) ([place]) - hypertensive R thalamic hemorrhage  06/27/2016  . 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 Retroperitoneal bleed 02/08/2016  . Generalized abdominal pain   . Gastroenteritis 02/04/2016  . Diarrhea 02/04/2016  . Hypokalemia 02/04/2016  . Incarcerated paraesophageal hernia 09/02/2014  . Renal mass, left 09/02/2014  . Acute esophagitis 09/02/2014  . Gastric outlet obstruction 09/02/2014  . Hypertension   . Vomiting 09/01/2014    Conditions to be addressed/monitored: HTN and DMII;  care coordination  Care Plan : Social Work Care Plan  Updates made by Daneen Schick since 11/21/2020 12:00 AM    Problem: Care Coordination     Long-Range Goal: Collaborate with RN Care Manager to perform appropriate assessments to assist with care coordination needs   Start Date: 08/10/2020  Expected End Date: 12/08/2020  Recent Progress: On track  Priority: Low  Note:   Current Barriers:  . Chronic conditions including HTN, Colitis, and DM II which put patient at increased risk of hospitalization  Social Work Clinical Goal(s):  Marland Kitchen Over the next 120 days, patient will work with SW to address concerns related to care coordination  Interventions: . 1:1 collaboration with Glendale Chard, MD regarding development and update of comprehensive plan of care as evidenced by provider attestation and co-signature . Inter-disciplinary care team collaboration (see longitudinal plan of care) . Successful outbound call placed to the patient to assess for care coordination needs post discharge . Determined the patient has picked up her antibiotic and is taking as prescribed . Discussed the patient is waiting on home health services to resume care but has been in touch with her therapist by phone . Patient reports she has an [facility] appointment on Friday  4.22 - no transportation barriers are present; patients son plans to transport her . Advised the patient to contact SW as needed prior to next scheduled call  Patient Goals/Self-Care Activities Over the next 60 days, patient will:   - Patient will self administer medications as prescribed Patient will attend all scheduled provider appointments Contact SW as needed prior to next scheduled call   Follow up Plan: SW will follow up with patient by phone over the next month       Follow Up Plan: SW will follow up with patient by phone over the next month      Daneen Schick, BSW, CDP Social Worker, Certified Dementia Practitioner [place] [facility] [phone]  Total time spent performing care coordination and/or care management activities with the patient by phone or face to face = 20 minutes.

## 2020-11-22 ENCOUNTER — Other Ambulatory Visit (HOSPITAL_COMMUNITY): Payer: Self-pay

## 2020-11-22 DIAGNOSIS — D1771 Benign lipomatous neoplasm of kidney: Secondary | ICD-10-CM | POA: Diagnosis not present

## 2020-11-22 DIAGNOSIS — N1831 Chronic kidney disease, stage 3a: Secondary | ICD-10-CM | POA: Diagnosis not present

## 2020-11-22 DIAGNOSIS — E1122 Type 2 diabetes mellitus with diabetic chronic kidney disease: Secondary | ICD-10-CM | POA: Diagnosis not present

## 2020-11-22 DIAGNOSIS — K2210 Ulcer of esophagus without bleeding: Secondary | ICD-10-CM | POA: Diagnosis not present

## 2020-11-22 DIAGNOSIS — E871 Hypo-osmolality and hyponatremia: Secondary | ICD-10-CM | POA: Diagnosis not present

## 2020-11-22 DIAGNOSIS — M1990 Unspecified osteoarthritis, unspecified site: Secondary | ICD-10-CM | POA: Diagnosis not present

## 2020-11-22 DIAGNOSIS — H9190 Unspecified hearing loss, unspecified ear: Secondary | ICD-10-CM | POA: Diagnosis not present

## 2020-11-22 DIAGNOSIS — N2889 Other specified disorders of kidney and ureter: Secondary | ICD-10-CM | POA: Diagnosis not present

## 2020-11-22 DIAGNOSIS — K209 Esophagitis, unspecified without bleeding: Secondary | ICD-10-CM | POA: Diagnosis not present

## 2020-11-22 DIAGNOSIS — E782 Mixed hyperlipidemia: Secondary | ICD-10-CM | POA: Diagnosis not present

## 2020-11-22 DIAGNOSIS — E875 Hyperkalemia: Secondary | ICD-10-CM | POA: Diagnosis not present

## 2020-11-22 DIAGNOSIS — K529 Noninfective gastroenteritis and colitis, unspecified: Secondary | ICD-10-CM | POA: Diagnosis not present

## 2020-11-22 DIAGNOSIS — I700 Atherosclerosis of aorta: Secondary | ICD-10-CM | POA: Diagnosis not present

## 2020-11-22 DIAGNOSIS — K440 Diaphragmatic hernia with obstruction, without gangrene: Secondary | ICD-10-CM | POA: Diagnosis not present

## 2020-11-22 DIAGNOSIS — K259 Gastric ulcer, unspecified as acute or chronic, without hemorrhage or perforation: Secondary | ICD-10-CM | POA: Diagnosis not present

## 2020-11-22 DIAGNOSIS — M545 Low back pain, unspecified: Secondary | ICD-10-CM | POA: Diagnosis not present

## 2020-11-22 DIAGNOSIS — G8929 Other chronic pain: Secondary | ICD-10-CM | POA: Diagnosis not present

## 2020-11-22 DIAGNOSIS — Z9181 History of falling: Secondary | ICD-10-CM | POA: Diagnosis not present

## 2020-11-22 DIAGNOSIS — I129 Hypertensive chronic kidney disease with stage 1 through stage 4 chronic kidney disease, or unspecified chronic kidney disease: Secondary | ICD-10-CM | POA: Diagnosis not present

## 2020-11-22 DIAGNOSIS — Z8673 Personal history of transient ischemic attack (TIA), and cerebral infarction without residual deficits: Secondary | ICD-10-CM | POA: Diagnosis not present

## 2020-11-22 DIAGNOSIS — N179 Acute kidney failure, unspecified: Secondary | ICD-10-CM | POA: Diagnosis not present

## 2020-11-23 ENCOUNTER — Other Ambulatory Visit: Payer: Self-pay

## 2020-11-23 ENCOUNTER — Encounter: Payer: Self-pay | Admitting: Physician Assistant

## 2020-11-23 ENCOUNTER — Other Ambulatory Visit (INDEPENDENT_AMBULATORY_CARE_PROVIDER_SITE_OTHER): Payer: Medicare Other

## 2020-11-23 ENCOUNTER — Ambulatory Visit (INDEPENDENT_AMBULATORY_CARE_PROVIDER_SITE_OTHER): Payer: Medicare Other | Admitting: Physician Assistant

## 2020-11-23 VITALS — BP 124/72 | HR 109 | Ht 63.0 in | Wt 140.0 lb

## 2020-11-23 DIAGNOSIS — K209 Esophagitis, unspecified without bleeding: Secondary | ICD-10-CM

## 2020-11-23 DIAGNOSIS — D649 Anemia, unspecified: Secondary | ICD-10-CM

## 2020-11-23 LAB — CBC WITH DIFFERENTIAL/PLATELET
Basophils Absolute: 0.1 10*3/uL (ref 0.0–0.1)
Basophils Relative: 1.1 % (ref 0.0–3.0)
Eosinophils Absolute: 0.2 10*3/uL (ref 0.0–0.7)
Eosinophils Relative: 1.6 % (ref 0.0–5.0)
HCT: 35.3 % — ABNORMAL LOW (ref 36.0–46.0)
Hemoglobin: 11.7 g/dL — ABNORMAL LOW (ref 12.0–15.0)
Lymphocytes Relative: 26.5 % (ref 12.0–46.0)
Lymphs Abs: 2.8 10*3/uL (ref 0.7–4.0)
MCHC: 33.2 g/dL (ref 30.0–36.0)
MCV: 90.4 fl (ref 78.0–100.0)
Monocytes Absolute: 0.9 10*3/uL (ref 0.1–1.0)
Monocytes Relative: 8.4 % (ref 3.0–12.0)
Neutro Abs: 6.5 10*3/uL (ref 1.4–7.7)
Neutrophils Relative %: 62.4 % (ref 43.0–77.0)
Platelets: 600 10*3/uL — ABNORMAL HIGH (ref 150.0–400.0)
RBC: 3.91 Mil/uL (ref 3.87–5.11)
RDW: 16 % — ABNORMAL HIGH (ref 11.5–15.5)
WBC: 10.4 10*3/uL (ref 4.0–10.5)

## 2020-11-23 LAB — BASIC METABOLIC PANEL
BUN: 30 mg/dL — ABNORMAL HIGH (ref 6–23)
CO2: 20 mEq/L (ref 19–32)
Calcium: 9.5 mg/dL (ref 8.4–10.5)
Chloride: 107 mEq/L (ref 96–112)
Creatinine, Ser: 0.95 mg/dL (ref 0.40–1.20)
GFR: 55.23 mL/min — ABNORMAL LOW (ref >60.00)
Glucose, Bld: 94 mg/dL (ref 70–99)
Potassium: 3.5 mEq/L (ref 3.5–5.1)
Sodium: 138 mEq/L (ref 135–145)

## 2020-11-23 NOTE — Patient Instructions (Addendum)
If you are age 84 or older, your body mass index should be between 23-30. Your Body mass index is 24.8 kg/m. If this is out of the aforementioned range listed, please consider follow up with your Primary Care Provider.  If you are age 52 or younger, your body mass index should be between 19-25. Your Body mass index is 24.8 kg/m. If this is out of the aformentioned range listed, please consider follow up with your Primary Care Provider.   Your provider has requested that you go to the basement level for lab work before leaving today. Press "B" on the elevator. The lab is located at the first door on the left as you exit the elevator.  Continue Pantoprazole 40 mg 1 tablet twice daily through June and the decrease to 1 tablet every morning.  Finish your Carafate suspension for 1 more month 4 times daily then stop.  Follow up with Dr. Ardis Hughs or Amy as needed.  Thank you for entrusting me with your care and choosing [facility].  Amy Esterwood, PA-C

## 2020-11-23 NOTE — Progress Notes (Signed)
Subjective:    Patient ID: Anna Hoffman, female    DOB: 16-Jan-1935, 86 y.o.   MRN: [id number]  HPI [name] is a pleasant 86 year old female, established with Dr. Ardis Hughs who comes in today for follow-up after recent hospitalization.  Patient has history of hypertension, coronary artery disease, adult onset diabetes mellitus, prior intracranial hemorrhage, chronic kidney disease stage III, she has history of multiple adenomatous colon polyps at last colonoscopy done February 2021. She was hospitalized in March 2022 with complaints of nausea and vomiting and as part of that work had undergone EGD per Dr. Rush Landmark on 10/12/2020 which did reveal grade C esophagitis and a 5 cm hiatal hernia, normal-appearing stomach.  She was recommended to take twice daily PPI x3 months and Carafate suspension 4 times daily for 1 month. She was rehospitalized 4/13 through 11/17/2020 and ultimately diagnosed with pneumonia.  She was not seen by GI during that admission.  She had complained by notes of coughing up a small amount of blood and then also vomiting some blood-streaked emesis.  As she had just been diagnosed with significant esophagitis she was continued on her current treatment and hemoglobins were followed. Her baseline hemoglobin is around 12, hemoglobin on discharge from hospital 11/17/2020 was 10.3 hematocrit of 31.1 and WBC of 9.9. Patient says she is feeling better but is still very fatigued in general, she has a little bit of a cough which has been nonproductive.  She did not have any further emesis or heme in her emesis after admission and has not noted any melena or hematochezia.  She says she has been eating fine has no complaints of dysphagia or odynophagia.  She says her son has been doing the cooking and his pushing food on her.  She has no complaints of abdominal pain and bowel movements have been regular for her.  She continues on twice daily Protonix and has been taking Carafate suspension as  directed.  Review of Systems Pertinent positive and negative review of systems were noted in the above HPI section.  All other review of systems was otherwise negative.  Outpatient Encounter Medications as of 11/23/2020  Medication Sig  . acetaminophen (TYLENOL) 325 MG tablet Take 2 tablets (650 mg total) by mouth every 6 (six) hours as needed for mild pain (or Fever >/= 101).  Marland Kitchen amLODipine (NORVASC) 10 MG tablet TAKE 1 TABLET BY MOUTH ONCE DAILY (Patient taking differently: Take 10 mg by mouth daily.)  . Ascorbic Acid (VITAMIN C) 1000 MG tablet Take 1,000 mg by mouth daily.  Marland Kitchen azelastine (ASTELIN) 0.1 % nasal spray Place 2 sprays into both nostrils daily as needed for rhinitis.  Marland Kitchen benzonatate (TESSALON PERLES) 100 MG capsule Take 1 capsule (100 mg total) by mouth 3 (three) times daily as needed for cough.  . Carboxymethylcellul-Glycerin (LUBRICATING EYE DROPS OP) Apply 1 drop to eye daily as needed (dry eyes).   . carvedilol (COREG) 3.125 MG tablet Take 1 tablet (3.125 mg total) by mouth 2 (two) times daily with a meal.  . doxycycline (VIBRA-TABS) 100 MG tablet Take 1 tablet (100 mg total) by mouth every 12 (twelve) hours for 6 days.  Marland Kitchen Fexofenadine HCl (ALLEGRA PO) Take 180 mg by mouth at bedtime.   Marland Kitchen glucose blood (ONETOUCH VERIO) test strip Use as instructed to check blood sugar 2 times per day dx: e11.65  . Lancets (ONETOUCH ULTRASOFT) lancets Use as instructed to check blood sugar 2 times per day dx code e11.65  . pantoprazole (  PROTONIX) 40 MG tablet Take 1 tablet (40 mg total) by mouth 2 (two) times daily before a meal.  . rosuvastatin (CRESTOR) 20 MG tablet TAKE 1 TABLET BY MOUTH ONCE DAILY  . sucralfate (CARAFATE) 1 GM/10ML suspension Take 10 mLs (1 g total) by mouth 4 (four) times daily -  with meals and at bedtime for 28 days.  Marland Kitchen trolamine salicylate (ASPERCREME) 10 % cream Apply 1 application topically daily as needed for muscle pain.  . vitamin B-12 (CYANOCOBALAMIN) 100 MCG tablet  Take 100 mcg by mouth daily.  . [DISCONTINUED] omeprazole (PRILOSEC) 20 MG capsule TAKE 1 CAPSULE BY MOUTH ONCE DAILY 30 MINUTES TO 1 HOUR BEFORE A MEAL (Patient taking differently: Take 20 mg by mouth daily.)   No facility-administered encounter medications on file as of 11/23/2020.   Allergies  Allergen Reactions  . Amoxicillin Diarrhea    Has patient had a PCN reaction causing immediate rash, facial/tongue/throat swelling, SOB or lightheadedness with hypotension: no Has patient had a PCN reaction causing severe rash involving mucus membranes or skin necrosis: no Has patient had a PCN reaction that required hospitalization: no pharmacist consult Has patient had a PCN reaction occurring within the last 10 years: yes If all of the above answers are "NO", then may proceed with Cephalosporin use.   . Ampicillin Rash    - Tolerates Rocephin and Ancef - Remote occurrence; no symptoms of anaphylaxis or severe cutaneous reaction, and no additional medical attention required    . Sulfa Antibiotics Rash   Patient Active Problem List   Diagnosis Date Noted  . CAP (community acquired pneumonia) 11/15/2020  . Dehydration 11/15/2020  . Erosive esophagitis   . Hiatal hernia   . Hyponatremia 10/10/2020  . Acute kidney injury superimposed on chronic kidney disease ([place]) 10/10/2020  . Aortic atherosclerosis ([place]) 10/09/2020  . Acute renal failure superimposed on stage 3a chronic kidney disease ([place]) 10/09/2020  . Hyperkalemia 10/09/2020  . Hearing loss 10/09/2020  . Prolonged QT interval 10/09/2020  . Colitis, acute 08/11/2019  . Colitis 07/11/2019  . Loose stools 06/07/2019  . Herpes zoster without complication 17/00/1749  . Other long term (current) drug therapy 08/12/2018  . Chronic renal disease, stage III ([place]) 07/19/2018  . Hypertensive nephropathy 07/19/2018  . Community acquired pneumonia of right upper lobe of lung 11/07/2017  . Hypertensive emergency 07/01/2016  . Thalamic  hemorrhage ([place]) 07/01/2016  . 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 Benign essential HTN   . Type 2 diabetes mellitus with stage 3 chronic kidney disease, without long-term current use of insulin ([place])   . Mixed hyperlipidemia   . ICH (intracerebral hemorrhage) ([place]) - hypertensive R thalamic hemorrhage  06/27/2016  . 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 Retroperitoneal bleed 02/08/2016  . Generalized abdominal pain   . Gastroenteritis 02/04/2016  . Diarrhea 02/04/2016  . Hypokalemia 02/04/2016  . Incarcerated paraesophageal hernia 09/02/2014  . Renal mass, left 09/02/2014  . Acute esophagitis 09/02/2014  . Gastric outlet obstruction 09/02/2014  . Hypertension   . Vomiting 09/01/2014   Social History   Socioeconomic History  . Marital status: Widowed    Spouse name: Not on file  . Number of children: 2  . Years of education: Not on file  . Highest education level: Not on file  Occupational History  . Occupation: retired  Tobacco Use  . Smoking status: Never Smoker  . Smokeless tobacco: Never Used  Vaping Use  . Vaping Use: Never used  Substance  and Sexual Activity  . Alcohol use: No  . Drug use: No  . Sexual activity: Not Currently    Birth control/protection: Surgical  Other Topics Concern  . Not on file  Social History Narrative   Son [name] lives with her   Social Determinants of Health   Financial Resource Strain: Low Risk   . Difficulty of Paying Living Expenses: Not hard at all  Food Insecurity: No Food Insecurity  . Worried About Charity fundraiser in the Last Year: Never true  . Ran Out of Food in the Last Year: Never true  Transportation Needs: No Transportation Needs  . Lack of Transportation (Medical): No  . Lack of Transportation (Non-Medical): No  Physical Activity: Insufficiently Active  . Days of Exercise per Week: 7 days  . Minutes of Exercise per Session: 10 min  Stress: No Stress Concern Present  . Feeling of Stress : Not at all   Social Connections: Not on file  Intimate Partner Violence: Not on file    Ms. Maclachlan's family history includes Alzheimer's disease in her mother; Brain cancer in her brother and brother; Pancreatic cancer in her sister; Prostate cancer in her father.      Objective:    Vitals:   11/23/20 1453  BP: 124/72  Pulse: (!) 109  SpO2: 98%    Physical Exam Well-developed well-nourished elderly African-American female in no acute distress.  Height, Weight, 140 BMI 24.8  HEENT; nontraumatic normocephalic, EOMI, PE R LA, sclera anicteric. Oropharynx; not examined today Neck; supple, no JVD Cardiovascular; regular rate and rhythm with S1-S2, no murmur rub or gallop Pulmonary; Clear bilaterally Abdomen; soft, nontender, nondistended, no palpable mass or hepatosplenomegaly, bowel sounds are active Rectal; not done today Skin; benign exam, no jaundice rash or appreciable lesions Extremities; no clubbing cyanosis or edema skin warm and dry Neuro/Psych; alert and oriented x4, grossly nonfocal mood and affect appropriate       Assessment & Plan:   #48 86 year old white female with grade C esophagitis on EGD March 2022 while hospitalized with nausea and vomiting. Patient had a readmission 4/13 through 11/17/2020 with diagnosis of pneumonia and had complained of an episode of blood-streaked emesis and possibly some hemoptysis on admission.  She did not manifest any active GI bleeding and was not seen by GI during that admission.  She was continued on treatment for acute esophagitis.  Hemoglobin on discharge 10.3 down from her baseline of 12. She has no current complaints of dysphagia or odynophagia., no abdominal pain, appetite is good and she has been eating without difficulty. No melena or hematochezia.  #2 mild anemia likely multifactorial #3 chronic kidney disease stage III 4.  Coronary artery disease 5.  Adult onset diabetes mellitus 6.  Status post intracranial hemorrhage 7.  History of  multiple adenomatous colon polyps-colonoscopy was done February 2021 with a total of 15 polyps removed all tubular adenomas #8 fatigue multifactorial status post 2 recent admissions and recent pneumonia  Plan; CBC and be met today Continue Protonix 40 mg p.o. twice daily through June then reduce to 40 mg p.o. every morning long-term She will complete 1 more month of Carafate suspension 4 times daily between meals and at bedtime then discontinue Follow-up with Dr. Ardis Hughs or myself on an as-needed basis.  Tyjae Shvartsman Genia Harold PA-C 11/23/2020   Cc: Glendale Chard, MD

## 2020-11-26 DIAGNOSIS — I129 Hypertensive chronic kidney disease with stage 1 through stage 4 chronic kidney disease, or unspecified chronic kidney disease: Secondary | ICD-10-CM | POA: Diagnosis not present

## 2020-11-26 DIAGNOSIS — E1122 Type 2 diabetes mellitus with diabetic chronic kidney disease: Secondary | ICD-10-CM | POA: Diagnosis not present

## 2020-11-26 DIAGNOSIS — N1831 Chronic kidney disease, stage 3a: Secondary | ICD-10-CM | POA: Diagnosis not present

## 2020-11-26 DIAGNOSIS — K209 Esophagitis, unspecified without bleeding: Secondary | ICD-10-CM | POA: Diagnosis not present

## 2020-11-26 DIAGNOSIS — I700 Atherosclerosis of aorta: Secondary | ICD-10-CM | POA: Diagnosis not present

## 2020-11-26 DIAGNOSIS — K2210 Ulcer of esophagus without bleeding: Secondary | ICD-10-CM | POA: Diagnosis not present

## 2020-11-26 DIAGNOSIS — E875 Hyperkalemia: Secondary | ICD-10-CM | POA: Diagnosis not present

## 2020-11-26 DIAGNOSIS — K259 Gastric ulcer, unspecified as acute or chronic, without hemorrhage or perforation: Secondary | ICD-10-CM | POA: Diagnosis not present

## 2020-11-26 DIAGNOSIS — Z9181 History of falling: Secondary | ICD-10-CM | POA: Diagnosis not present

## 2020-11-26 DIAGNOSIS — D1771 Benign lipomatous neoplasm of kidney: Secondary | ICD-10-CM | POA: Diagnosis not present

## 2020-11-26 DIAGNOSIS — E871 Hypo-osmolality and hyponatremia: Secondary | ICD-10-CM | POA: Diagnosis not present

## 2020-11-26 DIAGNOSIS — N179 Acute kidney failure, unspecified: Secondary | ICD-10-CM | POA: Diagnosis not present

## 2020-11-26 DIAGNOSIS — M545 Low back pain, unspecified: Secondary | ICD-10-CM | POA: Diagnosis not present

## 2020-11-26 DIAGNOSIS — E782 Mixed hyperlipidemia: Secondary | ICD-10-CM | POA: Diagnosis not present

## 2020-11-26 DIAGNOSIS — Z8673 Personal history of transient ischemic attack (TIA), and cerebral infarction without residual deficits: Secondary | ICD-10-CM | POA: Diagnosis not present

## 2020-11-26 DIAGNOSIS — K529 Noninfective gastroenteritis and colitis, unspecified: Secondary | ICD-10-CM | POA: Diagnosis not present

## 2020-11-26 DIAGNOSIS — H9190 Unspecified hearing loss, unspecified ear: Secondary | ICD-10-CM | POA: Diagnosis not present

## 2020-11-26 DIAGNOSIS — M1990 Unspecified osteoarthritis, unspecified site: Secondary | ICD-10-CM | POA: Diagnosis not present

## 2020-11-26 DIAGNOSIS — K440 Diaphragmatic hernia with obstruction, without gangrene: Secondary | ICD-10-CM | POA: Diagnosis not present

## 2020-11-26 DIAGNOSIS — N2889 Other specified disorders of kidney and ureter: Secondary | ICD-10-CM | POA: Diagnosis not present

## 2020-11-26 DIAGNOSIS — G8929 Other chronic pain: Secondary | ICD-10-CM | POA: Diagnosis not present

## 2020-11-26 NOTE — Progress Notes (Signed)
I agree with the above note, plan 

## 2020-11-27 ENCOUNTER — Telehealth: Payer: Self-pay | Admitting: Physician Assistant

## 2020-11-27 ENCOUNTER — Ambulatory Visit: Payer: Medicare Other

## 2020-11-27 NOTE — Telephone Encounter (Signed)
Spoke with the patient about her lab results. Hgb is improved. She tells me sh has had a spell of vomiting after eating. She will feel nauseated and vomit. She is then okay. This has happened twice. Denies choking. Afebrile. Today she has not eaten and has not vomited. Reviewed her medications. She has been taking Pantoprazole after she eats. Today she will take her Pantoprazole now and wait 30 minutes before she eats. Discussed that she should do this every day.  Please advise. I plan on calling her again in a week.

## 2020-11-27 NOTE — Telephone Encounter (Signed)
Patient wanted to know if I could tell her if her pneumonia had cleared up. She will see her PCP tomorrow. Advised her to ask the PCP. She wants to know if she can go get her hair done. I advised her to go get her hair done.

## 2020-11-27 NOTE — Telephone Encounter (Signed)
Inbound call from patient requesting a call from a nurse in regards to her labs please.

## 2020-11-27 NOTE — Telephone Encounter (Signed)
Patient called in with more questions about possible restrictions. Best contact number [phone]

## 2020-11-28 DIAGNOSIS — K2210 Ulcer of esophagus without bleeding: Secondary | ICD-10-CM | POA: Diagnosis not present

## 2020-11-28 DIAGNOSIS — K529 Noninfective gastroenteritis and colitis, unspecified: Secondary | ICD-10-CM | POA: Diagnosis not present

## 2020-11-28 DIAGNOSIS — M1990 Unspecified osteoarthritis, unspecified site: Secondary | ICD-10-CM | POA: Diagnosis not present

## 2020-11-28 DIAGNOSIS — N179 Acute kidney failure, unspecified: Secondary | ICD-10-CM | POA: Diagnosis not present

## 2020-11-28 DIAGNOSIS — K259 Gastric ulcer, unspecified as acute or chronic, without hemorrhage or perforation: Secondary | ICD-10-CM | POA: Diagnosis not present

## 2020-11-28 DIAGNOSIS — N2889 Other specified disorders of kidney and ureter: Secondary | ICD-10-CM | POA: Diagnosis not present

## 2020-11-28 DIAGNOSIS — N1831 Chronic kidney disease, stage 3a: Secondary | ICD-10-CM | POA: Diagnosis not present

## 2020-11-28 DIAGNOSIS — E782 Mixed hyperlipidemia: Secondary | ICD-10-CM | POA: Diagnosis not present

## 2020-11-28 DIAGNOSIS — I129 Hypertensive chronic kidney disease with stage 1 through stage 4 chronic kidney disease, or unspecified chronic kidney disease: Secondary | ICD-10-CM | POA: Diagnosis not present

## 2020-11-28 DIAGNOSIS — D1771 Benign lipomatous neoplasm of kidney: Secondary | ICD-10-CM | POA: Diagnosis not present

## 2020-11-28 DIAGNOSIS — E871 Hypo-osmolality and hyponatremia: Secondary | ICD-10-CM | POA: Diagnosis not present

## 2020-11-28 DIAGNOSIS — I700 Atherosclerosis of aorta: Secondary | ICD-10-CM | POA: Diagnosis not present

## 2020-11-28 DIAGNOSIS — G8929 Other chronic pain: Secondary | ICD-10-CM | POA: Diagnosis not present

## 2020-11-28 DIAGNOSIS — Z8673 Personal history of transient ischemic attack (TIA), and cerebral infarction without residual deficits: Secondary | ICD-10-CM | POA: Diagnosis not present

## 2020-11-28 DIAGNOSIS — H9190 Unspecified hearing loss, unspecified ear: Secondary | ICD-10-CM | POA: Diagnosis not present

## 2020-11-28 DIAGNOSIS — K440 Diaphragmatic hernia with obstruction, without gangrene: Secondary | ICD-10-CM | POA: Diagnosis not present

## 2020-11-28 DIAGNOSIS — M545 Low back pain, unspecified: Secondary | ICD-10-CM | POA: Diagnosis not present

## 2020-11-28 DIAGNOSIS — E1122 Type 2 diabetes mellitus with diabetic chronic kidney disease: Secondary | ICD-10-CM | POA: Diagnosis not present

## 2020-11-28 DIAGNOSIS — K209 Esophagitis, unspecified without bleeding: Secondary | ICD-10-CM | POA: Diagnosis not present

## 2020-11-28 DIAGNOSIS — Z9181 History of falling: Secondary | ICD-10-CM | POA: Diagnosis not present

## 2020-11-28 DIAGNOSIS — E875 Hyperkalemia: Secondary | ICD-10-CM | POA: Diagnosis not present

## 2020-11-29 ENCOUNTER — Ambulatory Visit (INDEPENDENT_AMBULATORY_CARE_PROVIDER_SITE_OTHER): Payer: Medicare Other | Admitting: Nurse Practitioner

## 2020-11-29 ENCOUNTER — Other Ambulatory Visit: Payer: Self-pay

## 2020-11-29 VITALS — BP 132/80 | HR 92 | Temp 98.0°F | Ht 63.4 in | Wt 134.0 lb

## 2020-11-29 DIAGNOSIS — Z09 Encounter for follow-up examination after completed treatment for conditions other than malignant neoplasm: Secondary | ICD-10-CM

## 2020-11-29 DIAGNOSIS — R21 Rash and other nonspecific skin eruption: Secondary | ICD-10-CM

## 2020-11-29 MED ORDER — TRIAMCINOLONE ACETONIDE 0.025 % EX CREA: 1.0000 "application " | TOPICAL_CREAM | Freq: Two times a day (BID) | CUTANEOUS | 0 refills | Status: DC

## 2020-11-29 NOTE — Patient Instructions (Signed)
Pneumonitis  Pneumonitis is an inflammation of the lungs. This condition can be caused by an infection or exposure to certain substances. It can also be caused by things you are allergic to (allergens).  What are the causes?  This condition may be caused by:  · An infection from bacteria or a virus (pneumonia).  · Exposure to certain substances in the workplace. These workplaces include farms and certain industries. Some substances that can cause this condition include asbestos, silica, inhaled acids, or inhaled chlorine gas.  · Repeated exposure to bird feathers, bird feces, or other allergens.  · Medicines such as chemotherapy drugs, certain antibiotic medicines, and some heart medicines.  · Radiation therapy.  · Exposure to mold. A hot tub, sauna, or home humidifier can have mold growing in it, even if it looks clean. You can breathe in the mold through water vapor.  · Breathing stomach contents, food, or liquids into the lungs.  What are the signs or symptoms?  Symptoms of this condition include:  · Shortness of breath or trouble breathing. This is the most common symptom.  · Cough.  · Fever.  · Chest tightness.  · Decreased energy.  · Decreased appetite.  How is this diagnosed?  This condition may be diagnosed based on:  · Your medical history.  · A physical exam.  · Blood tests.  · Other tests, including:  ? A pulmonary function test (PFT). This measures how well your lungs work.  ? A chest X-ray.  ? A CT scan of the lungs.  ? A bronchoscopy. In this procedure, your health care provider looks at your airways through an instrument called a bronchoscope.  ? A lung biopsy. In this procedure, your health care provider takes a small piece of tissue from your lungs to examine it.  How is this treated?  Treatment depends on the cause of the condition. If the cause is exposure to a substance, avoiding further exposure to that substance will help reduce your symptoms. If needed, treatment for pneumonitis may  include:  · Corticosteroid medicine to help decrease inflammation.  · Antibiotic medicine to help fight an infection caused by bacteria.  · Medicines that help relax the muscles and make breathing easier, such as bronchodilators or inhalers.  · Oxygen therapy, if you are having trouble breathing.  Follow these instructions at home:         Medicines  · Take over-the-counter and prescription medicines only as told by your health care provider. This includes using any inhalers only as told by your health care provider.  · If you were prescribed an antibiotic medicine, take it as told by your health care provider. Do not stop using the antibiotic even if you start to feel better.  · If you were prescribed an inhaler, keep it with you at all times.  General instructions  · Avoid exposure to any substance that caused your pneumonitis. If you need to work with substances that can cause pneumonitis, use an air purifier while working and wear a mask to protect your lungs.  · Do not use any products that contain nicotine or tobacco, such as cigarettes, e-cigarettes, and chewing tobacco. If you need help quitting, ask your health care provider.  · Keep all follow-up visits as told by your health care provider. This is important.  Contact a health care provider if:  · You have a fever.  · Your symptoms get worse.  Get help right away if:  · You have   new or worse shortness of breath.  · You develop a blue color under your fingernails.  Summary  · Pneumonitis is an inflammation of the lungs. This condition can be caused by infection or exposure to certain substances or allergens.  · The most common symptom of this condition is shortness of breath or trouble breathing.  · Treatment depends on the cause of your condition.  This information is not intended to replace advice given to you by your health care provider. Make sure you discuss any questions you have with your health care provider.  Document Revised: 07/12/2019 Document  Reviewed: 07/12/2019  Elsevier Patient Education © 2021 Elsevier Inc.
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
## 2020-12-03 DIAGNOSIS — G8929 Other chronic pain: Secondary | ICD-10-CM | POA: Diagnosis not present

## 2020-12-03 DIAGNOSIS — K259 Gastric ulcer, unspecified as acute or chronic, without hemorrhage or perforation: Secondary | ICD-10-CM | POA: Diagnosis not present

## 2020-12-03 DIAGNOSIS — N1831 Chronic kidney disease, stage 3a: Secondary | ICD-10-CM | POA: Diagnosis not present

## 2020-12-03 DIAGNOSIS — I700 Atherosclerosis of aorta: Secondary | ICD-10-CM | POA: Diagnosis not present

## 2020-12-03 DIAGNOSIS — M1990 Unspecified osteoarthritis, unspecified site: Secondary | ICD-10-CM | POA: Diagnosis not present

## 2020-12-03 DIAGNOSIS — K209 Esophagitis, unspecified without bleeding: Secondary | ICD-10-CM | POA: Diagnosis not present

## 2020-12-03 DIAGNOSIS — E871 Hypo-osmolality and hyponatremia: Secondary | ICD-10-CM | POA: Diagnosis not present

## 2020-12-03 DIAGNOSIS — K529 Noninfective gastroenteritis and colitis, unspecified: Secondary | ICD-10-CM | POA: Diagnosis not present

## 2020-12-03 DIAGNOSIS — Z8673 Personal history of transient ischemic attack (TIA), and cerebral infarction without residual deficits: Secondary | ICD-10-CM | POA: Diagnosis not present

## 2020-12-03 DIAGNOSIS — D1771 Benign lipomatous neoplasm of kidney: Secondary | ICD-10-CM | POA: Diagnosis not present

## 2020-12-03 DIAGNOSIS — K440 Diaphragmatic hernia with obstruction, without gangrene: Secondary | ICD-10-CM | POA: Diagnosis not present

## 2020-12-03 DIAGNOSIS — K2210 Ulcer of esophagus without bleeding: Secondary | ICD-10-CM | POA: Diagnosis not present

## 2020-12-03 DIAGNOSIS — E1122 Type 2 diabetes mellitus with diabetic chronic kidney disease: Secondary | ICD-10-CM | POA: Diagnosis not present

## 2020-12-03 DIAGNOSIS — E875 Hyperkalemia: Secondary | ICD-10-CM | POA: Diagnosis not present

## 2020-12-03 DIAGNOSIS — H9190 Unspecified hearing loss, unspecified ear: Secondary | ICD-10-CM | POA: Diagnosis not present

## 2020-12-03 DIAGNOSIS — Z9181 History of falling: Secondary | ICD-10-CM | POA: Diagnosis not present

## 2020-12-03 DIAGNOSIS — M545 Low back pain, unspecified: Secondary | ICD-10-CM | POA: Diagnosis not present

## 2020-12-03 DIAGNOSIS — N179 Acute kidney failure, unspecified: Secondary | ICD-10-CM | POA: Diagnosis not present

## 2020-12-03 DIAGNOSIS — E782 Mixed hyperlipidemia: Secondary | ICD-10-CM | POA: Diagnosis not present

## 2020-12-03 DIAGNOSIS — N2889 Other specified disorders of kidney and ureter: Secondary | ICD-10-CM | POA: Diagnosis not present

## 2020-12-03 DIAGNOSIS — M170 Bilateral primary osteoarthritis of knee: Secondary | ICD-10-CM | POA: Diagnosis not present

## 2020-12-03 DIAGNOSIS — I129 Hypertensive chronic kidney disease with stage 1 through stage 4 chronic kidney disease, or unspecified chronic kidney disease: Secondary | ICD-10-CM | POA: Diagnosis not present

## 2020-12-06 ENCOUNTER — Telehealth: Payer: Self-pay

## 2020-12-06 DIAGNOSIS — E871 Hypo-osmolality and hyponatremia: Secondary | ICD-10-CM | POA: Diagnosis not present

## 2020-12-06 DIAGNOSIS — I700 Atherosclerosis of aorta: Secondary | ICD-10-CM | POA: Diagnosis not present

## 2020-12-06 DIAGNOSIS — N1831 Chronic kidney disease, stage 3a: Secondary | ICD-10-CM | POA: Diagnosis not present

## 2020-12-06 DIAGNOSIS — H9190 Unspecified hearing loss, unspecified ear: Secondary | ICD-10-CM | POA: Diagnosis not present

## 2020-12-06 DIAGNOSIS — M545 Low back pain, unspecified: Secondary | ICD-10-CM | POA: Diagnosis not present

## 2020-12-06 DIAGNOSIS — I129 Hypertensive chronic kidney disease with stage 1 through stage 4 chronic kidney disease, or unspecified chronic kidney disease: Secondary | ICD-10-CM | POA: Diagnosis not present

## 2020-12-06 DIAGNOSIS — E875 Hyperkalemia: Secondary | ICD-10-CM | POA: Diagnosis not present

## 2020-12-06 DIAGNOSIS — M1990 Unspecified osteoarthritis, unspecified site: Secondary | ICD-10-CM | POA: Diagnosis not present

## 2020-12-06 DIAGNOSIS — K2210 Ulcer of esophagus without bleeding: Secondary | ICD-10-CM | POA: Diagnosis not present

## 2020-12-06 DIAGNOSIS — E782 Mixed hyperlipidemia: Secondary | ICD-10-CM | POA: Diagnosis not present

## 2020-12-06 DIAGNOSIS — K440 Diaphragmatic hernia with obstruction, without gangrene: Secondary | ICD-10-CM | POA: Diagnosis not present

## 2020-12-06 DIAGNOSIS — K259 Gastric ulcer, unspecified as acute or chronic, without hemorrhage or perforation: Secondary | ICD-10-CM | POA: Diagnosis not present

## 2020-12-06 DIAGNOSIS — N2889 Other specified disorders of kidney and ureter: Secondary | ICD-10-CM | POA: Diagnosis not present

## 2020-12-06 DIAGNOSIS — K209 Esophagitis, unspecified without bleeding: Secondary | ICD-10-CM | POA: Diagnosis not present

## 2020-12-06 DIAGNOSIS — E1122 Type 2 diabetes mellitus with diabetic chronic kidney disease: Secondary | ICD-10-CM | POA: Diagnosis not present

## 2020-12-06 DIAGNOSIS — Z9181 History of falling: Secondary | ICD-10-CM | POA: Diagnosis not present

## 2020-12-06 DIAGNOSIS — D1771 Benign lipomatous neoplasm of kidney: Secondary | ICD-10-CM | POA: Diagnosis not present

## 2020-12-06 DIAGNOSIS — N179 Acute kidney failure, unspecified: Secondary | ICD-10-CM | POA: Diagnosis not present

## 2020-12-06 DIAGNOSIS — G8929 Other chronic pain: Secondary | ICD-10-CM | POA: Diagnosis not present

## 2020-12-06 DIAGNOSIS — K529 Noninfective gastroenteritis and colitis, unspecified: Secondary | ICD-10-CM | POA: Diagnosis not present

## 2020-12-06 DIAGNOSIS — Z8673 Personal history of transient ischemic attack (TIA), and cerebral infarction without residual deficits: Secondary | ICD-10-CM | POA: Diagnosis not present

## 2020-12-06 NOTE — Telephone Encounter (Signed)
Son would like a call back regarding mother and her care.

## 2020-12-09 ENCOUNTER — Other Ambulatory Visit: Payer: Self-pay | Admitting: Internal Medicine

## 2020-12-11 ENCOUNTER — Ambulatory Visit: Payer: Medicare Other | Admitting: Internal Medicine

## 2020-12-11 ENCOUNTER — Ambulatory Visit (INDEPENDENT_AMBULATORY_CARE_PROVIDER_SITE_OTHER): Payer: Medicare Other

## 2020-12-11 ENCOUNTER — Other Ambulatory Visit: Payer: Self-pay | Admitting: Nurse Practitioner

## 2020-12-11 DIAGNOSIS — E1122 Type 2 diabetes mellitus with diabetic chronic kidney disease: Secondary | ICD-10-CM | POA: Diagnosis not present

## 2020-12-11 DIAGNOSIS — K2210 Ulcer of esophagus without bleeding: Secondary | ICD-10-CM | POA: Diagnosis not present

## 2020-12-11 DIAGNOSIS — N183 Chronic kidney disease, stage 3 unspecified: Secondary | ICD-10-CM

## 2020-12-11 DIAGNOSIS — H9190 Unspecified hearing loss, unspecified ear: Secondary | ICD-10-CM | POA: Diagnosis not present

## 2020-12-11 DIAGNOSIS — N179 Acute kidney failure, unspecified: Secondary | ICD-10-CM | POA: Diagnosis not present

## 2020-12-11 DIAGNOSIS — N1831 Chronic kidney disease, stage 3a: Secondary | ICD-10-CM | POA: Diagnosis not present

## 2020-12-11 DIAGNOSIS — I10 Essential (primary) hypertension: Secondary | ICD-10-CM | POA: Diagnosis not present

## 2020-12-11 DIAGNOSIS — D1771 Benign lipomatous neoplasm of kidney: Secondary | ICD-10-CM | POA: Diagnosis not present

## 2020-12-11 DIAGNOSIS — N2889 Other specified disorders of kidney and ureter: Secondary | ICD-10-CM | POA: Diagnosis not present

## 2020-12-11 DIAGNOSIS — K529 Noninfective gastroenteritis and colitis, unspecified: Secondary | ICD-10-CM | POA: Diagnosis not present

## 2020-12-11 DIAGNOSIS — E875 Hyperkalemia: Secondary | ICD-10-CM | POA: Diagnosis not present

## 2020-12-11 DIAGNOSIS — M545 Low back pain, unspecified: Secondary | ICD-10-CM | POA: Diagnosis not present

## 2020-12-11 DIAGNOSIS — Z9181 History of falling: Secondary | ICD-10-CM | POA: Diagnosis not present

## 2020-12-11 DIAGNOSIS — K209 Esophagitis, unspecified without bleeding: Secondary | ICD-10-CM | POA: Diagnosis not present

## 2020-12-11 DIAGNOSIS — K259 Gastric ulcer, unspecified as acute or chronic, without hemorrhage or perforation: Secondary | ICD-10-CM | POA: Diagnosis not present

## 2020-12-11 DIAGNOSIS — E871 Hypo-osmolality and hyponatremia: Secondary | ICD-10-CM | POA: Diagnosis not present

## 2020-12-11 DIAGNOSIS — R21 Rash and other nonspecific skin eruption: Secondary | ICD-10-CM

## 2020-12-11 DIAGNOSIS — K440 Diaphragmatic hernia with obstruction, without gangrene: Secondary | ICD-10-CM | POA: Diagnosis not present

## 2020-12-11 DIAGNOSIS — G8929 Other chronic pain: Secondary | ICD-10-CM | POA: Diagnosis not present

## 2020-12-11 DIAGNOSIS — I129 Hypertensive chronic kidney disease with stage 1 through stage 4 chronic kidney disease, or unspecified chronic kidney disease: Secondary | ICD-10-CM | POA: Diagnosis not present

## 2020-12-11 DIAGNOSIS — M1990 Unspecified osteoarthritis, unspecified site: Secondary | ICD-10-CM | POA: Diagnosis not present

## 2020-12-11 DIAGNOSIS — Z8673 Personal history of transient ischemic attack (TIA), and cerebral infarction without residual deficits: Secondary | ICD-10-CM | POA: Diagnosis not present

## 2020-12-11 DIAGNOSIS — E782 Mixed hyperlipidemia: Secondary | ICD-10-CM | POA: Diagnosis not present

## 2020-12-11 DIAGNOSIS — I700 Atherosclerosis of aorta: Secondary | ICD-10-CM | POA: Diagnosis not present

## 2020-12-11 MED ORDER — TRIAMCINOLONE ACETONIDE 0.025 % EX CREA: 1.0000 "application " | TOPICAL_CREAM | Freq: Two times a day (BID) | CUTANEOUS | 0 refills | Status: DC

## 2020-12-11 NOTE — Patient Instructions (Signed)
Social Worker Visit Information  Goals we discussed today:  Goals Addressed            This Visit's Progress   . Work with SW to manage care coordination needs       Timeframe:  Long-Range Goal Priority:  Low Start Date:    1.7.22                         Expected End Date:  5.7.22                     Next planned outreach date: 6.10.22   Patient Goals/Self-Care Activities Over the next 60 days, patient will:   - Patient will self administer medications as prescribed -Patient will attend all scheduled provider appointments -Contact SW as needed prior to next scheduled call Pick up prescribed cream from pharmacy and use as directed Attend appointment on 5/19 for evaluation of rash        Materials Provided: Yes: Provided verbal information regarding provider recommendations  Follow Up Plan: SW will follow up with patient by phone over the next month   Daneen Schick, BSW, CDP Social Worker, Certified Dementia Practitioner [place] [facility] [phone]

## 2020-12-11 NOTE — Chronic Care Management (AMB) (Signed)
Chronic Care Management    Social Work Note  12/11/2020 Name: Anna Hoffman MRN: [id number] DOB: 07/07/1935  Braeleigh Pyper is a 86 y.o. year old female who is a primary care patient of Glendale Chard, MD. The CCM team was consulted to assist the patient with chronic disease management and/or care coordination needs related to: [employer] .   Engaged with patient by telephone for follow up visit in response to provider referral for social work chronic care management and care coordination services.   Consent to Services:  The patient was given information about Chronic Care Management services, agreed to services, and gave verbal consent prior to initiation of services.  Please see initial visit note for detailed documentation.   Patient agreed to services and consent obtained.   Assessment: Review of patient past medical history, allergies, medications, and health status, including review of relevant consultants reports was performed today as part of a comprehensive evaluation and provision of chronic care management and care coordination services.     SDOH (Social Determinants of Health) assessments and interventions performed:    Advanced Directives Status: Not addressed in this encounter.  CCM Care Plan  Allergies  Allergen Reactions  . Amoxicillin Diarrhea    Has patient had a PCN reaction causing immediate rash, facial/tongue/throat swelling, SOB or lightheadedness with hypotension: no Has patient had a PCN reaction causing severe rash involving mucus membranes or skin necrosis: no Has patient had a PCN reaction that required hospitalization: no pharmacist consult Has patient had a PCN reaction occurring within the last 10 years: yes If all of the above answers are "NO", then may proceed with Cephalosporin use.   . Ampicillin Rash    - Tolerates Rocephin and Ancef - Remote occurrence; no symptoms of anaphylaxis or severe cutaneous reaction, and no additional medical  attention required    . Sulfa Antibiotics Rash    Outpatient Encounter Medications as of 12/11/2020  Medication Sig  . acetaminophen (TYLENOL) 325 MG tablet Take 2 tablets (650 mg total) by mouth every 6 (six) hours as needed for mild pain (or Fever >/= 101).  Marland Kitchen amLODipine (NORVASC) 10 MG tablet TAKE 1 TABLET BY MOUTH ONCE DAILY (Patient taking differently: Take 10 mg by mouth daily.)  . Ascorbic Acid (VITAMIN C) 1000 MG tablet Take 1,000 mg by mouth daily.  Marland Kitchen azelastine (ASTELIN) 0.1 % nasal spray Place 2 sprays into both nostrils daily as needed for rhinitis.  Marland Kitchen benzonatate (TESSALON PERLES) 100 MG capsule Take 1 capsule (100 mg total) by mouth 3 (three) times daily as needed for cough.  . Carboxymethylcellul-Glycerin (LUBRICATING EYE DROPS OP) Apply 1 drop to eye daily as needed (dry eyes).   . carvedilol (COREG) 3.125 MG tablet Take 1 tablet (3.125 mg total) by mouth 2 (two) times daily with a meal.  . Fexofenadine HCl (ALLEGRA PO) Take 180 mg by mouth at bedtime.   Marland Kitchen glucose blood (ONETOUCH VERIO) test strip Use as instructed to check blood sugar 2 times per day dx: e11.65  . Lancets (ONETOUCH ULTRASOFT) lancets Use as instructed to check blood sugar 2 times per day dx code e11.65  . pantoprazole (PROTONIX) 40 MG tablet TAKE 1 TABLET BY MOUTH TWICE A DAY BEFORE MEALS  . rosuvastatin (CRESTOR) 20 MG tablet TAKE 1 TABLET BY MOUTH ONCE DAILY  . sucralfate (CARAFATE) 1 GM/10ML suspension Take 10 mLs (1 g total) by mouth 4 (four) times daily -  with meals and at bedtime for 28 days.  Marland Kitchen  trolamine salicylate (ASPERCREME) 10 % cream Apply 1 application topically daily as needed for muscle pain.  . vitamin B-12 (CYANOCOBALAMIN) 100 MCG tablet Take 100 mcg by mouth daily.  . [DISCONTINUED] triamcinolone (KENALOG) 0.025 % cream Apply 1 application topically 2 (two) times daily.   No facility-administered encounter medications on file as of 12/11/2020.    Patient Active Problem List   Diagnosis  Date Noted  . CAP (community acquired pneumonia) 11/15/2020  . Dehydration 11/15/2020  . Erosive esophagitis   . Hiatal hernia   . Hyponatremia 10/10/2020  . Acute kidney injury superimposed on chronic kidney disease ([place]) 10/10/2020  . Aortic atherosclerosis ([place]) 10/09/2020  . Acute renal failure superimposed on stage 3a chronic kidney disease ([place]) 10/09/2020  . Hyperkalemia 10/09/2020  . Hearing loss 10/09/2020  . Prolonged QT interval 10/09/2020  . Colitis, acute 08/11/2019  . Colitis 07/11/2019  . Loose stools 06/07/2019  . Herpes zoster without complication 29/92/4268  . Other long term (current) drug therapy 08/12/2018  . Chronic renal disease, stage III ([place]) 07/19/2018  . Hypertensive nephropathy 07/19/2018  . Community acquired pneumonia of right upper lobe of lung 11/07/2017  . Hypertensive emergency 07/01/2016  . Thalamic hemorrhage ([place]) 07/01/2016  . 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 Benign essential HTN   . Type 2 diabetes mellitus with stage 3 chronic kidney disease, without long-term current use of insulin ([place])   . Mixed hyperlipidemia   . ICH (intracerebral hemorrhage) ([place]) - hypertensive R thalamic hemorrhage  06/27/2016  . 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 Retroperitoneal bleed 02/08/2016  . Generalized abdominal pain   . Gastroenteritis 02/04/2016  . Diarrhea 02/04/2016  . Hypokalemia 02/04/2016  . Incarcerated paraesophageal hernia 09/02/2014  . Renal mass, left 09/02/2014  . Acute esophagitis 09/02/2014  . Gastric outlet obstruction 09/02/2014  . Hypertension   . Vomiting 09/01/2014    Conditions to be addressed/monitored: HTN and DMII; Care Coordination  Care Plan : Social Work Care Plan  Updates made by Daneen Schick since 12/11/2020 12:00 AM    Problem: Care Coordination     Long-Range Goal: Collaborate with RN Care Manager to perform appropriate assessments to assist with care coordination needs   Start Date: 08/10/2020   Expected End Date: 12/08/2020  Recent Progress: On track  Priority: Low  Note:   Current Barriers:  . Chronic conditions including HTN, Colitis, and DM II which put patient at increased risk of hospitalization  Social Work Clinical Goal(s):  Marland Kitchen Over the next 120 days, patient will work with SW to address concerns related to care coordination  Interventions: . 1:1 collaboration with Glendale Chard, MD regarding development and update of comprehensive plan of care as evidenced by provider attestation and co-signature . Inter-disciplinary care team collaboration (see longitudinal plan of care) . Successful outbound call placed to the patient to assess for care coordination needs  . Discussed the patient continues to have a rash on her back and is out of prescription cream . Performed chart review to note patient was seen in office on 4/28 by Bary Castilla, NP for a hospital follow up as well as to address the rash on her back. Patient was prescribed Triamcinolone Cream . Determined the patient is out of the cream and is still experiencing itching and burning in the area . Collaboration with Bary Castilla, NP who indicates she will refill medication and recommends the patient be seen in clinic to assess rash and evaluate if a referral to dermatology  is needed . Scheduled patient to be seen on Thursday 5.19 - patient is unable to schedule a sooner office visit due to relying on her son to assist with transportation . Attempted to schedule patient a sooner appointment and arrange transportation via [facility] transportation - patient declined stating last time she utilized [employer] she became sick and spent 2 days in the hospital . Collaboration with Bary Castilla, NP to inform when patient is scheduled - SW was informed by NP if the patients symptoms worsen prior to 5/19 she may go to urgent care for evaluation . Informed patient of this recommendation - patient stated  understanding . Scheduled follow up call over the next month Patient Goals/Self-Care Activities Over the next 30 days, patient will:   - Patient will self administer medications as prescribed Patient will attend all scheduled provider appointments Contact SW as needed prior to next scheduled call Pick up prescribed cream from pharmacy and use as directed Attend appointment on 5/19 for evaluation of rash   Follow up Plan: SW will follow up with patient by phone over the next month       Follow Up Plan: SW will follow up with patient by phone over the next month      Daneen Schick, BSW, CDP Social Worker, Certified Dementia Practitioner [place] [facility] [place] [phone]  Total time spent performing care coordination and/or care management activities with the patient by phone or face to face = 28 minutes.

## 2020-12-12 ENCOUNTER — Other Ambulatory Visit: Payer: Self-pay | Admitting: Internal Medicine

## 2020-12-13 DIAGNOSIS — M170 Bilateral primary osteoarthritis of knee: Secondary | ICD-10-CM | POA: Diagnosis not present

## 2020-12-18 DIAGNOSIS — E1122 Type 2 diabetes mellitus with diabetic chronic kidney disease: Secondary | ICD-10-CM | POA: Diagnosis not present

## 2020-12-18 DIAGNOSIS — Z9181 History of falling: Secondary | ICD-10-CM | POA: Diagnosis not present

## 2020-12-18 DIAGNOSIS — E782 Mixed hyperlipidemia: Secondary | ICD-10-CM | POA: Diagnosis not present

## 2020-12-18 DIAGNOSIS — Z8673 Personal history of transient ischemic attack (TIA), and cerebral infarction without residual deficits: Secondary | ICD-10-CM | POA: Diagnosis not present

## 2020-12-18 DIAGNOSIS — G8929 Other chronic pain: Secondary | ICD-10-CM | POA: Diagnosis not present

## 2020-12-18 DIAGNOSIS — M545 Low back pain, unspecified: Secondary | ICD-10-CM | POA: Diagnosis not present

## 2020-12-18 DIAGNOSIS — I700 Atherosclerosis of aorta: Secondary | ICD-10-CM | POA: Diagnosis not present

## 2020-12-18 DIAGNOSIS — K209 Esophagitis, unspecified without bleeding: Secondary | ICD-10-CM | POA: Diagnosis not present

## 2020-12-18 DIAGNOSIS — K2210 Ulcer of esophagus without bleeding: Secondary | ICD-10-CM | POA: Diagnosis not present

## 2020-12-18 DIAGNOSIS — H9190 Unspecified hearing loss, unspecified ear: Secondary | ICD-10-CM | POA: Diagnosis not present

## 2020-12-18 DIAGNOSIS — K529 Noninfective gastroenteritis and colitis, unspecified: Secondary | ICD-10-CM | POA: Diagnosis not present

## 2020-12-18 DIAGNOSIS — N1831 Chronic kidney disease, stage 3a: Secondary | ICD-10-CM | POA: Diagnosis not present

## 2020-12-18 DIAGNOSIS — I129 Hypertensive chronic kidney disease with stage 1 through stage 4 chronic kidney disease, or unspecified chronic kidney disease: Secondary | ICD-10-CM | POA: Diagnosis not present

## 2020-12-18 DIAGNOSIS — M1990 Unspecified osteoarthritis, unspecified site: Secondary | ICD-10-CM | POA: Diagnosis not present

## 2020-12-18 DIAGNOSIS — K259 Gastric ulcer, unspecified as acute or chronic, without hemorrhage or perforation: Secondary | ICD-10-CM | POA: Diagnosis not present

## 2020-12-18 DIAGNOSIS — E871 Hypo-osmolality and hyponatremia: Secondary | ICD-10-CM | POA: Diagnosis not present

## 2020-12-18 DIAGNOSIS — E875 Hyperkalemia: Secondary | ICD-10-CM | POA: Diagnosis not present

## 2020-12-18 DIAGNOSIS — N179 Acute kidney failure, unspecified: Secondary | ICD-10-CM | POA: Diagnosis not present

## 2020-12-18 DIAGNOSIS — K440 Diaphragmatic hernia with obstruction, without gangrene: Secondary | ICD-10-CM | POA: Diagnosis not present

## 2020-12-18 DIAGNOSIS — N2889 Other specified disorders of kidney and ureter: Secondary | ICD-10-CM | POA: Diagnosis not present

## 2020-12-18 DIAGNOSIS — D1771 Benign lipomatous neoplasm of kidney: Secondary | ICD-10-CM | POA: Diagnosis not present

## 2020-12-20 ENCOUNTER — Ambulatory Visit: Payer: Medicare Other | Admitting: Nurse Practitioner

## 2020-12-20 DIAGNOSIS — M170 Bilateral primary osteoarthritis of knee: Secondary | ICD-10-CM | POA: Diagnosis not present

## 2020-12-24 ENCOUNTER — Other Ambulatory Visit: Payer: Self-pay | Admitting: Internal Medicine

## 2020-12-24 ENCOUNTER — Other Ambulatory Visit (HOSPITAL_COMMUNITY): Payer: Self-pay

## 2020-12-24 MED ORDER — ONETOUCH ULTRASOFT LANCETS MISC
12 refills | Status: DC
  Filled 2020-12-24: qty 100, 50d supply, fill #0

## 2020-12-28 ENCOUNTER — Other Ambulatory Visit (HOSPITAL_COMMUNITY): Payer: Self-pay

## 2020-12-30 ENCOUNTER — Other Ambulatory Visit: Payer: Self-pay | Admitting: Internal Medicine

## 2021-01-04 ENCOUNTER — Other Ambulatory Visit (HOSPITAL_COMMUNITY): Payer: Self-pay

## 2021-01-04 MED FILL — Amlodipine Besylate Tab 10 MG (Base Equivalent): ORAL | 90 days supply | Qty: 90 | Fill #0 | Status: AC

## 2021-01-11 ENCOUNTER — Ambulatory Visit (INDEPENDENT_AMBULATORY_CARE_PROVIDER_SITE_OTHER): Payer: Medicare Other

## 2021-01-11 DIAGNOSIS — N183 Chronic kidney disease, stage 3 unspecified: Secondary | ICD-10-CM

## 2021-01-11 DIAGNOSIS — E1122 Type 2 diabetes mellitus with diabetic chronic kidney disease: Secondary | ICD-10-CM

## 2021-01-11 DIAGNOSIS — K529 Noninfective gastroenteritis and colitis, unspecified: Secondary | ICD-10-CM

## 2021-01-11 DIAGNOSIS — I10 Essential (primary) hypertension: Secondary | ICD-10-CM

## 2021-01-11 NOTE — Chronic Care Management (AMB) (Signed)
Chronic Care Management    Social Work Note  01/11/2021 Name: Anna Hoffman MRN: [id number] DOB: [birth year]  Anna Hoffman is a 86 y.o. year old female who is a primary care patient of Glendale Chard, MD. The CCM team was consulted to assist the patient with chronic disease management and/or care coordination needs related to: Transportation Needs .   Engaged with patient by telephone for follow up visit in response to provider referral for social work chronic care management and care coordination services.   Consent to Services:  The patient was given information about Chronic Care Management services, agreed to services, and gave verbal consent prior to initiation of services.  Please see initial visit note for detailed documentation.   Patient agreed to services and consent obtained.   Assessment: Review of patient past medical history, allergies, medications, and health status, including review of relevant consultants reports was performed today as part of a comprehensive evaluation and provision of chronic care management and care coordination services.     SDOH (Social Determinants of Health) assessments and interventions performed:    Advanced Directives Status: Not addressed in this encounter.  CCM Care Plan  Allergies  Allergen Reactions   Amoxicillin Diarrhea    Has patient had a PCN reaction causing immediate rash, facial/tongue/throat swelling, SOB or lightheadedness with hypotension: no Has patient had a PCN reaction causing severe rash involving mucus membranes or skin necrosis: no Has patient had a PCN reaction that required hospitalization: no pharmacist consult Has patient had a PCN reaction occurring within the last 10 years: yes If all of the above answers are "NO", then may proceed with Cephalosporin use.    Ampicillin Rash    - Tolerates Rocephin and Ancef - Remote occurrence; no symptoms of anaphylaxis or severe cutaneous reaction, and no additional medical  attention required     Sulfa Antibiotics Rash    Outpatient Encounter Medications as of 01/11/2021  Medication Sig   acetaminophen (TYLENOL) 325 MG tablet Take 2 tablets (650 mg total) by mouth every 6 (six) hours as needed for mild pain (or Fever >/= 101).   amLODipine (NORVASC) 10 MG tablet TAKE 1 TABLET BY MOUTH ONCE DAILY (Patient taking differently: Take 10 mg by mouth daily.)   Ascorbic Acid (VITAMIN C) 1000 MG tablet Take 1,000 mg by mouth daily.   azelastine (ASTELIN) 0.1 % nasal spray Place 2 sprays into both nostrils daily as needed for rhinitis.   benzonatate (TESSALON PERLES) 100 MG capsule Take 1 capsule (100 mg total) by mouth 3 (three) times daily as needed for cough.   Carboxymethylcellul-Glycerin (LUBRICATING EYE DROPS OP) Apply 1 drop to eye daily as needed (dry eyes).    carvedilol (COREG) 3.125 MG tablet Take 1 tablet (3.125 mg total) by mouth 2 (two) times daily with a meal.   Fexofenadine HCl (ALLEGRA PO) Take 180 mg by mouth at bedtime.    glucose blood (ONETOUCH VERIO) test strip Use as instructed to check blood sugar 2 times per day dx: e11.65   Lancets (ONETOUCH ULTRASOFT) lancets Use as instructed to check blood sugar 2 times per day dx code e11.65   pantoprazole (PROTONIX) 40 MG tablet TAKE 1 TABLET BY MOUTH TWICE A DAY BEFORE MEALS   rosuvastatin (CRESTOR) 20 MG tablet TAKE 1 TABLET BY MOUTH ONCE DAILY   sucralfate (CARAFATE) 1 GM/10ML suspension TAKE 10 MLS (1 G TOTAL) BY MOUTH 4 (FOUR) TIMES DAILY - WITH MEALS AND AT BEDTIME FOR 28 DAYS.  triamcinolone (KENALOG) 0.025 % cream Apply 1 application topically 2 (two) times daily.   trolamine salicylate (ASPERCREME) 10 % cream Apply 1 application topically daily as needed for muscle pain.   vitamin B-12 (CYANOCOBALAMIN) 100 MCG tablet Take 100 mcg by mouth daily.   No facility-administered encounter medications on file as of 01/11/2021.    Patient Active Problem List   Diagnosis Date Noted   CAP (community  acquired pneumonia) 11/15/2020   Dehydration 11/15/2020   Erosive esophagitis    Hiatal hernia    Hyponatremia 10/10/2020   Acute kidney injury superimposed on chronic kidney disease ([place]) 10/10/2020   Aortic atherosclerosis ([place]) 10/09/2020   Acute renal failure superimposed on stage 3a chronic kidney disease ([place]) 10/09/2020   Hyperkalemia 10/09/2020   Hearing loss 10/09/2020   Prolonged QT interval 10/09/2020   Colitis, acute 08/11/2019   Colitis 07/11/2019   Loose stools 06/07/2019   Herpes zoster without complication 05/39/7673   Other long term (current) drug therapy 08/12/2018   Chronic renal disease, stage III ([place]) 07/19/2018   Hypertensive nephropathy 07/19/2018   Community acquired pneumonia of right upper lobe of lung 11/07/2017   Hypertensive emergency 07/01/2016   Thalamic hemorrhage ([place]) 07/01/2016   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[place])    Benign essential HTN    Type 2 diabetes mellitus with stage 3 chronic kidney disease, without long-term current use of insulin ([place])    Mixed hyperlipidemia    ICH (intracerebral hemorrhage) ([place]) - hypertensive R thalamic hemorrhage  06/27/2016   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   Retroperitoneal bleed 02/08/2016   Generalized abdominal pain    Gastroenteritis 02/04/2016   Diarrhea 02/04/2016   Hypokalemia 02/04/2016   Incarcerated paraesophageal hernia 09/02/2014   Renal mass, left 09/02/2014   Acute esophagitis 09/02/2014   Gastric outlet obstruction 09/02/2014   Hypertension    Vomiting 09/01/2014    Conditions to be addressed/monitored: HTN, DMII, and Colitis ; Transportation  Care Plan : Social Work Care Plan  Updates made by Daneen Schick since 01/11/2021 12:00 AM     Problem: Care Coordination Resolved 01/11/2021     Long-Range Goal: Collaborate with RN Care Manager to perform appropriate assessments to assist with care coordination needs Completed 01/11/2021  Start Date: 08/10/2020  Expected End Date:  12/08/2020  Recent Progress: On track  Priority: Low  Note:   Current Barriers:  Chronic conditions including HTN, Colitis, and DM II which put patient at increased risk of hospitalization  Social Work Clinical Goal(s):  Over the next 120 days, patient will work with SW to address concerns related to care coordination  Interventions: 1:1 collaboration with Glendale Chard, MD regarding development and update of comprehensive plan of care as evidenced by provider attestation and co-signature Inter-disciplinary care team collaboration (see longitudinal plan of care) Successful outbound call placed to the patient to assess goal progression Patient reports she did pick up her prescription cream and her rash has resolved Goal closed Patient Goals/Self-Care Activities patient will:   -  Contact SW as needed       Problem: Barriers to Treatment - Transportation      Goal: Barriers to Treatment Identified and Managed   Start Date: 01/11/2021  Expected End Date: 02/10/2021  Priority: Medium  Note:   Current Barriers:  Chronic disease management support and education needs related to HTN, DM, and Colitis   Transportation  Social Worker Clinical Goal(s):  patient will work with SW to identify and address any acute and/or chronic  care coordination needs related to the self health management of HTN, DM, and Colitis   patient will work with SW to address concerns related to transportation for upcomming primary care appointment  SW Interventions:  Inter-disciplinary care team collaboration (see longitudinal plan of care) Collaboration with Glendale Chard, MD regarding development and update of comprehensive plan of care as evidenced by provider attestation and co-signature Successful outbound call placed to the patient to assess for care coordination needs Patient reports she is doing well with no acute concerns at this time Performed chart review to note patient has [place] office visit  scheduled with Dr. Baird Cancer on 6.23.22 at 2:00 pm Assessed for transportation barriers - patient reports her son who normally assists is unable to due to work schedule Discussed the patient plans to ask her friend to help with transportation needs Advised the patient SW would contact her prior to her appointment to confirm transportation needs have been met Scheduled follow up call on 6.20.22 Patient Goals/Self-Care Activities patient will:   -  Contact her friend to determine if she can assist with transportation needs -Contact SW prior to next scheduled appointment as needed   Follow Up Plan:  SW will follow up with the patient on 6.20.22       Follow Up Plan: Appointment scheduled for SW follow up with client by phone on: 6.20.22      Daneen Schick, [place], CDP Social Worker, Certified Dementia Practitioner [place] [facility] [phone]  Total time spent performing care coordination and/or care management activities with the patient by phone or face to face = 25 minutes.

## 2021-01-11 NOTE — Patient Instructions (Signed)
Social Worker Visit Information  Goals we discussed today:   Goals Addressed             This Visit's Progress    Barriers to Treatment Identified and Managed       Timeframe:  Short-Term Goal Priority:  Medium Start Date:  6.10.22                           Expected End Date:  7.10.22                     Next planned outreach: 6.20.22  Patient Goals/Self-Care Activities  patient will:   -  Contact her friend to determine if she can assist with transportation needs -Contact SW prior to next scheduled appointment as needed      COMPLETED: Work with SW to manage care coordination needs       Timeframe:  Long-Range Goal Priority:  Low Start Date:    1.7.22                         Expected End Date:  5.7.22                     Goal Closed   Patient Goals/Self-Care Activities patient will:  -Sport and exercise psychologist SW as needed            Materials Provided: Verbal education about transportation resource options provided by phone  Follow Up Plan: Appointment scheduled for SW follow up with client by phone on: 6.20.22   Daneen Schick, [place], CDP Social Worker, Certified Dementia Practitioner [place] [facility] [phone]

## 2021-01-14 ENCOUNTER — Telehealth: Payer: Self-pay | Admitting: Physician Assistant

## 2021-01-14 NOTE — Telephone Encounter (Signed)
Patient reports she is doing well. She has been careful about what she eats. Rich foods will make her sick. But if she does not eat those things, she is fine. She is finishing the bottle of Carafate and will not refill it. Continue the pantoprazole. Agrees to call us if she begins having problems.

## 2021-01-14 NOTE — Telephone Encounter (Signed)
Inbound call from pt requesting a call back

## 2021-01-21 ENCOUNTER — Ambulatory Visit: Payer: Medicare Other

## 2021-01-21 DIAGNOSIS — N183 Chronic kidney disease, stage 3 unspecified: Secondary | ICD-10-CM

## 2021-01-21 DIAGNOSIS — I10 Essential (primary) hypertension: Secondary | ICD-10-CM

## 2021-01-21 DIAGNOSIS — K529 Noninfective gastroenteritis and colitis, unspecified: Secondary | ICD-10-CM

## 2021-01-21 NOTE — Patient Instructions (Signed)
Social Worker Visit Information  Goals we discussed today:   Goals Addressed             This Visit's Progress    Barriers to Treatment Identified and Managed   On track    Timeframe:  Short-Term Goal Priority:  Medium Start Date:  6.10.22                           Expected End Date:  7.10.22                     Next planned outreach: 6.21.22  Patient Goals/Self-Care Activities  patient will:   -  Contact her friend to determine if she can assist with transportation needs -Contact SW prior to next scheduled appointment as needed          Materials Provided: Verbal education about [employer] provided by phone  Follow Up Plan: SW will follow up with patient by phone over the next 24 hours  Daneen Schick, BSW, CDP Social Worker, Certified Dementia Practitioner [place] [facility] [place] [facility] [phone]
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
## 2021-01-22 ENCOUNTER — Ambulatory Visit: Payer: Medicare Other

## 2021-01-22 DIAGNOSIS — N183 Chronic kidney disease, stage 3 unspecified: Secondary | ICD-10-CM

## 2021-01-22 DIAGNOSIS — K529 Noninfective gastroenteritis and colitis, unspecified: Secondary | ICD-10-CM

## 2021-01-22 DIAGNOSIS — I10 Essential (primary) hypertension: Secondary | ICD-10-CM

## 2021-01-22 DIAGNOSIS — E1122 Type 2 diabetes mellitus with diabetic chronic kidney disease: Secondary | ICD-10-CM

## 2021-01-22 NOTE — Chronic Care Management (AMB) (Signed)
Chronic Care Management    Social Work Note  01/22/2021 Name: Anna Hoffman MRN: [id number] DOB: [birth year]  Aleksandra Raben is a 86 y.o. year old female who is a primary care patient of Glendale Chard, MD. The CCM team was consulted to assist the patient with chronic disease management and/or care coordination needs related to: Transportation Needs .   Engaged with patient by telephone for follow up visit in response to provider referral for social work chronic care management and care coordination services.   Consent to Services:  The patient was given information about Chronic Care Management services, agreed to services, and gave verbal consent prior to initiation of services.  Please see initial visit note for detailed documentation.   Patient agreed to services and consent obtained.   Assessment: Review of patient past medical history, allergies, medications, and health status, including review of relevant consultants reports was performed today as part of a comprehensive evaluation and provision of chronic care management and care coordination services.     SDOH (Social Determinants of Health) assessments and interventions performed:    Advanced Directives Status: Not addressed in this encounter.  CCM Care Plan  Allergies  Allergen Reactions   Amoxicillin Diarrhea    Has patient had a PCN reaction causing immediate rash, facial/tongue/throat swelling, SOB or lightheadedness with hypotension: no Has patient had a PCN reaction causing severe rash involving mucus membranes or skin necrosis: no Has patient had a PCN reaction that required hospitalization: no pharmacist consult Has patient had a PCN reaction occurring within the last 10 years: yes If all of the above answers are "NO", then may proceed with Cephalosporin use.    Ampicillin Rash    - Tolerates Rocephin and Ancef - Remote occurrence; no symptoms of anaphylaxis or severe cutaneous reaction, and no additional medical  attention required     Sulfa Antibiotics Rash    Outpatient Encounter Medications as of 01/22/2021  Medication Sig   acetaminophen (TYLENOL) 325 MG tablet Take 2 tablets (650 mg total) by mouth every 6 (six) hours as needed for mild pain (or Fever >/= 101).   amLODipine (NORVASC) 10 MG tablet TAKE 1 TABLET BY MOUTH ONCE DAILY (Patient taking differently: Take 10 mg by mouth daily.)   Ascorbic Acid (VITAMIN C) 1000 MG tablet Take 1,000 mg by mouth daily.   azelastine (ASTELIN) 0.1 % nasal spray Place 2 sprays into both nostrils daily as needed for rhinitis.   benzonatate (TESSALON PERLES) 100 MG capsule Take 1 capsule (100 mg total) by mouth 3 (three) times daily as needed for cough.   Carboxymethylcellul-Glycerin (LUBRICATING EYE DROPS OP) Apply 1 drop to eye daily as needed (dry eyes).    carvedilol (COREG) 3.125 MG tablet Take 1 tablet (3.125 mg total) by mouth 2 (two) times daily with a meal.   Fexofenadine HCl (ALLEGRA PO) Take 180 mg by mouth at bedtime.    glucose blood (ONETOUCH VERIO) test strip Use as instructed to check blood sugar 2 times per day dx: e11.65   Lancets (ONETOUCH ULTRASOFT) lancets Use as instructed to check blood sugar 2 times per day dx code e11.65   pantoprazole (PROTONIX) 40 MG tablet TAKE 1 TABLET BY MOUTH TWICE A DAY BEFORE MEALS   rosuvastatin (CRESTOR) 20 MG tablet TAKE 1 TABLET BY MOUTH ONCE DAILY   sucralfate (CARAFATE) 1 GM/10ML suspension TAKE 10 MLS (1 G TOTAL) BY MOUTH 4 (FOUR) TIMES DAILY - WITH MEALS AND AT BEDTIME FOR 28 DAYS.  triamcinolone (KENALOG) 0.025 % cream Apply 1 application topically 2 (two) times daily.   trolamine salicylate (ASPERCREME) 10 % cream Apply 1 application topically daily as needed for muscle pain.   vitamin B-12 (CYANOCOBALAMIN) 100 MCG tablet Take 100 mcg by mouth daily.   No facility-administered encounter medications on file as of 01/22/2021.    Patient Active Problem List   Diagnosis Date Noted   CAP (community  acquired pneumonia) 11/15/2020   Dehydration 11/15/2020   Erosive esophagitis    Hiatal hernia    Hyponatremia 10/10/2020   Acute kidney injury superimposed on chronic kidney disease ([place]) 10/10/2020   Aortic atherosclerosis ([place]) 10/09/2020   Acute renal failure superimposed on stage 3a chronic kidney disease ([place]) 10/09/2020   Hyperkalemia 10/09/2020   Hearing loss 10/09/2020   Prolonged QT interval 10/09/2020   Colitis, acute 08/11/2019   Colitis 07/11/2019   Loose stools 06/07/2019   Herpes zoster without complication 78/46/9629   Other long term (current) drug therapy 08/12/2018   Chronic renal disease, stage III ([place]) 07/19/2018   Hypertensive nephropathy 07/19/2018   Community acquired pneumonia of right upper lobe of lung 11/07/2017   Hypertensive emergency 07/01/2016   Thalamic hemorrhage ([place]) 07/01/2016   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[place])    Benign essential HTN    Type 2 diabetes mellitus with stage 3 chronic kidney disease, without long-term current use of insulin ([place])    Mixed hyperlipidemia    ICH (intracerebral hemorrhage) ([place]) - hypertensive R thalamic hemorrhage  06/27/2016   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   Retroperitoneal bleed 02/08/2016   Generalized abdominal pain    Gastroenteritis 02/04/2016   Diarrhea 02/04/2016   Hypokalemia 02/04/2016   Incarcerated paraesophageal hernia 09/02/2014   Renal mass, left 09/02/2014   Acute esophagitis 09/02/2014   Gastric outlet obstruction 09/02/2014   Hypertension    Vomiting 09/01/2014    Conditions to be addressed/monitored: HTN, DMII, and Colitis ; Transportation  Care Plan : Social Work Care Plan  Updates made by Daneen Schick since 01/22/2021 12:00 AM     Problem: Barriers to Treatment - Transportation      Goal: Barriers to Treatment Identified and Managed   Start Date: 01/11/2021  Expected End Date: 02/10/2021  This Visit's Progress: On track  Recent Progress: On track  Priority:  Medium  Note:   Current Barriers:  Chronic disease management support and education needs related to HTN, DM, and Colitis   Transportation  Social Worker Clinical Goal(s):  patient will work with SW to identify and address any acute and/or chronic care coordination needs related to the self health management of HTN, DM, and Colitis   patient will work with SW to address concerns related to transportation for upcomming primary care appointment  SW Interventions:  Inter-disciplinary care team collaboration (see longitudinal plan of care) Collaboration with Glendale Chard, MD regarding development and update of comprehensive plan of care as evidenced by provider attestation and co-signature Successful outbound call placed to the patient to assess for care coordination needs Confirmed the patient has secured transportation to her [place] appointment - patient states her niece will provide transportation Scheduled follow up call over the next month Patient Goals/Self-Care Activities patient will:   -  Attend [place] provider appointment -Contact SW prior to next scheduled appointment as needed   Follow Up Plan:  SW will follow up with the patient over the next month       Follow Up Plan: SW will follow up with patient  by phone over the next month      Daneen Schick, BSW, CDP Social Worker, Certified Dementia Practitioner [place] [facility] [phone]  Total time spent performing care coordination and/or care management activities with the patient by phone or face to face = 10 minutes.

## 2021-01-22 NOTE — Patient Instructions (Signed)
Social Worker Visit Information  Goals we discussed today:   Goals Addressed             This Visit's Progress    Barriers to Treatment Identified and Managed   On track    Timeframe:  Short-Term Goal Priority:  Medium Start Date:  6.10.22                           Expected End Date:  7.10.22                     Next planned outreach: 7.28.22  Patient Goals/Self-Care Activities  patient will:   -  Attend upcomming provider appointment -Contact SW prior to next scheduled appointment as needed          Follow Up Plan: SW will follow up with patient by phone over the next month  Daneen Schick, BSW, CDP Social Worker, Certified Dementia Practitioner [place] [facility] [phone]

## 2021-01-24 ENCOUNTER — Ambulatory Visit (INDEPENDENT_AMBULATORY_CARE_PROVIDER_SITE_OTHER): Payer: Medicare Other | Admitting: Internal Medicine

## 2021-01-24 ENCOUNTER — Other Ambulatory Visit: Payer: Self-pay

## 2021-01-24 ENCOUNTER — Encounter: Payer: Self-pay | Admitting: Internal Medicine

## 2021-01-24 VITALS — BP 134/88 | HR 91 | Temp 98.6°F | Ht 63.4 in | Wt 136.4 lb

## 2021-01-24 DIAGNOSIS — E1122 Type 2 diabetes mellitus with diabetic chronic kidney disease: Secondary | ICD-10-CM

## 2021-01-24 DIAGNOSIS — R21 Rash and other nonspecific skin eruption: Secondary | ICD-10-CM | POA: Diagnosis not present

## 2021-01-24 DIAGNOSIS — N1831 Chronic kidney disease, stage 3a: Secondary | ICD-10-CM

## 2021-01-24 DIAGNOSIS — N183 Chronic kidney disease, stage 3 unspecified: Secondary | ICD-10-CM

## 2021-01-24 DIAGNOSIS — I700 Atherosclerosis of aorta: Secondary | ICD-10-CM | POA: Diagnosis not present

## 2021-01-24 DIAGNOSIS — I129 Hypertensive chronic kidney disease with stage 1 through stage 4 chronic kidney disease, or unspecified chronic kidney disease: Secondary | ICD-10-CM

## 2021-01-24 DIAGNOSIS — L309 Dermatitis, unspecified: Secondary | ICD-10-CM

## 2021-01-24 MED ORDER — TRIAMCINOLONE ACETONIDE 0.025 % EX CREA: 1.0000 "application " | TOPICAL_CREAM | Freq: Two times a day (BID) | CUTANEOUS | 0 refills | Status: DC

## 2021-01-24 NOTE — Progress Notes (Signed)
I,Katawbba Wiggins,acting as a Education administrator for Maximino Greenland, MD.,have documented all relevant documentation on the behalf of Maximino Greenland, MD,as directed by  Maximino Greenland, MD while in the presence of Maximino Greenland, MD.  This visit occurred during the SARS-CoV-2 public health emergency.  Safety protocols were in place, including screening questions prior to the visit, additional usage of staff PPE, and extensive cleaning of exam room while observing appropriate contact time as indicated for disinfecting solutions.  Subjective:     Patient ID: Anna Hoffman , female    DOB: August 27, 1934 , 86 y.o.   MRN: [id number]   Chief Complaint  Patient presents with   Diabetes   Hypertension    HPI  She is here today for DM/BP check. She reports compliance with meds. She denies headaches, chest pain and shortness of breath. She now has an aide that comes in weekly. She reports aide is always too tired to do anything b/c she works so many jobs.   Diabetes She presents for her follow-up diabetic visit. She has type 2 diabetes mellitus. Her disease course has been stable. There are no hypoglycemic associated symptoms. Pertinent negatives for hypoglycemia include no headaches. Pertinent negatives for diabetes include no blurred vision and no chest pain. There are no hypoglycemic complications. Risk factors for coronary artery disease include diabetes mellitus, dyslipidemia, hypertension, stress and post-menopausal. She is compliant with treatment most of the time. Her weight is stable. She is following a diabetic diet. Her breakfast blood glucose is taken between 7-8 am. Her breakfast blood glucose range is generally 90-110 mg/dl.  Hypertension This is a chronic problem. The current episode started more than 1 year ago. The problem has been gradually improving since onset. The problem is controlled. Pertinent negatives include no blurred vision, chest pain, headaches or shortness of breath.    Past Medical  History:  Diagnosis Date   Arthritis    Diabetes mellitus without complication ([place])    Hypertension    Hypokalemia    Pneumonia    Shingles    Stroke [facility])      Family History  Problem Relation Age of Onset   Alzheimer's disease Mother    Prostate cancer Father    Brain cancer Brother    Brain cancer Brother    Pancreatic cancer Sister    Allergic rhinitis Neg Hx    Asthma Neg Hx    Eczema Neg Hx    Urticaria Neg Hx      Current Outpatient Medications:    acetaminophen (TYLENOL) 325 MG tablet, Take 2 tablets (650 mg total) by mouth every 6 (six) hours as needed for mild pain (or Fever >/= 101)., Disp: , Rfl:    amLODipine (NORVASC) 10 MG tablet, TAKE 1 TABLET BY MOUTH ONCE DAILY (Patient taking differently: Take 10 mg by mouth daily.), Disp: 90 tablet, Rfl: 1   Ascorbic Acid (VITAMIN C) 1000 MG tablet, Take 1,000 mg by mouth daily., Disp: , Rfl:    azelastine (ASTELIN) 0.1 % nasal spray, Place 2 sprays into both nostrils daily as needed for rhinitis., Disp: , Rfl:    Carboxymethylcellul-Glycerin (LUBRICATING EYE DROPS OP), Apply 1 drop to eye daily as needed (dry eyes). , Disp: , Rfl:    carvedilol (COREG) 3.125 MG tablet, Take 1 tablet (3.125 mg total) by mouth 2 (two) times daily with a meal., Disp: 180 tablet, Rfl: 0   Fexofenadine HCl (ALLEGRA PO), Take 180 mg by mouth at bedtime. , Disp: ,  Rfl:    glucose blood (ONETOUCH VERIO) test strip, Use as instructed to check blood sugar 2 times per day dx: e11.65, Disp: 100 each, Rfl: 11   Lancets (ONETOUCH ULTRASOFT) lancets, Use as instructed to check blood sugar 2 times per day dx code e11.65, Disp: 100 each, Rfl: 12   pantoprazole (PROTONIX) 40 MG tablet, TAKE 1 TABLET BY MOUTH TWICE A DAY BEFORE MEALS, Disp: 180 tablet, Rfl: 1   rosuvastatin (CRESTOR) 20 MG tablet, TAKE 1 TABLET BY MOUTH ONCE DAILY, Disp: 90 tablet, Rfl: 1   sucralfate (CARAFATE) 1 GM/10ML suspension, TAKE 10 MLS (1 G TOTAL) BY MOUTH 4 (FOUR) TIMES DAILY - WITH  MEALS AND AT BEDTIME FOR 28 DAYS., Disp: 840 mL, Rfl: 1   trolamine salicylate (ASPERCREME) 10 % cream, Apply 1 application topically daily as needed for muscle pain., Disp: , Rfl:    vitamin B-12 (CYANOCOBALAMIN) 100 MCG tablet, Take 100 mcg by mouth daily., Disp: , Rfl:    benzonatate (TESSALON PERLES) 100 MG capsule, Take 1 capsule (100 mg total) by mouth 3 (three) times daily as needed for cough. (Patient not taking: Reported on 01/24/2021), Disp: 30 capsule, Rfl: 0   triamcinolone (KENALOG) 0.025 % cream, Apply 1 application topically 2 (two) times daily. As needed, Disp: 30 g, Rfl: 0   Allergies  Allergen Reactions   Amoxicillin Diarrhea    Has patient had a PCN reaction causing immediate rash, facial/tongue/throat swelling, SOB or lightheadedness with hypotension: no Has patient had a PCN reaction causing severe rash involving mucus membranes or skin necrosis: no Has patient had a PCN reaction that required hospitalization: no pharmacist consult Has patient had a PCN reaction occurring within the last 10 years: yes If all of the above answers are "NO", then may proceed with Cephalosporin use.    Ampicillin Rash    - Tolerates Rocephin and Ancef - Remote occurrence; no symptoms of anaphylaxis or severe cutaneous reaction, and no additional medical attention required     Sulfa Antibiotics Rash     Review of Systems  Constitutional: Negative.   Eyes:  Negative for blurred vision.  Respiratory: Negative.  Negative for shortness of breath.   Cardiovascular: Negative.  Negative for chest pain.  Gastrointestinal: Negative.   Skin:  Positive for rash.       She c/o itchy rash on her back. Not sure what is causing her sx. Doesn't feel like the shingles she has had in the past.   Neurological: Negative.  Negative for headaches.  Psychiatric/Behavioral: Negative.      Today's Vitals   01/24/21 1411  BP: 134/88  Pulse: 91  Temp: 98.6 F (37 C)  TempSrc: Oral  Weight: 136 lb 6.4 oz  (61.9 kg)  Height: 5' 3.4" (1.61 m)  PainSc: 0-No pain   Body mass index is 23.86 kg/m.  Wt Readings from Last 3 Encounters:  01/24/21 136 lb 6.4 oz (61.9 kg)  11/29/20 134 lb (60.8 kg)  11/23/20 140 lb (63.5 kg)    BP Readings from Last 3 Encounters:  01/24/21 134/88  11/29/20 132/80  11/23/20 124/72    Objective:  Physical Exam Vitals and nursing note reviewed.  Constitutional:      Appearance: Normal appearance.  HENT:     Head: Normocephalic and atraumatic.     Nose:     Comments: Masked     Mouth/Throat:     Comments: Masked  Eyes:     Extraocular Movements: Extraocular movements intact.  Cardiovascular:  Rate and Rhythm: Normal rate and regular rhythm.     Heart sounds: Normal heart sounds.  Pulmonary:     Effort: Pulmonary effort is normal.     Breath sounds: Normal breath sounds.  Skin:    General: Skin is warm.     Comments: Erythematous papular lesions on mid and upper back, it does cross the midline. No vesicular lesions noted.   Neurological:     General: No focal deficit present.     Mental Status: She is alert.  Psychiatric:        Mood and Affect: Mood normal.        Behavior: Behavior normal.        Assessment And Plan:     1. Type 2 diabetes mellitus with stage 3a chronic kidney disease, without long-term current use of insulin ([place]) Comments: Chronic, last a1c was 6.3 in April 2022. She is no longer on meds. No need to check labs today. She agrees to rto in 2-3 months for re-evaluation.   2. Hypertensive nephropathy Comments: Chronic, fair control. Advised to follow low sodium diet.   3. Aortic atherosclerosis ([place]) Comments: Chronic, advised to follow heart healthy diet. Importance of statin compliance was also d/w pt.   4. Rash of back Comments: Possibly due to bug bites. I will send rx refill of triamcinolone cream to apply to affected areas bid prn.  - triamcinolone (KENALOG) 0.025 % cream; Apply 1 application topically 2 (two)  times daily. As needed  Dispense: 30 g; Refill: 0    Patient was given opportunity to ask questions. Patient verbalized understanding of the plan and was able to repeat key elements of the plan. All questions were answered to their satisfaction.   I, Maximino Greenland, MD, have reviewed all documentation for this visit. The documentation on 01/27/21 for the exam, diagnosis, procedures, and orders are all accurate and complete.   IF YOU HAVE BEEN REFERRED TO A SPECIALIST, IT MAY TAKE 1-2 WEEKS TO SCHEDULE/PROCESS THE REFERRAL. IF YOU HAVE NOT HEARD FROM US/SPECIALIST IN TWO WEEKS, PLEASE GIVE [place] A CALL AT [phone].   THE PATIENT IS ENCOURAGED TO PRACTICE SOCIAL DISTANCING DUE TO THE COVID-19 PANDEMIC.

## 2021-01-24 NOTE — Patient Instructions (Signed)

## 2021-01-28 ENCOUNTER — Telehealth: Payer: Medicare Other

## 2021-01-28 ENCOUNTER — Telehealth: Payer: Self-pay

## 2021-01-28 NOTE — Telephone Encounter (Signed)
  Care Management   Follow Up Note   01/28/2021 Name: Anna Hoffman MRN: [id number] DOB: November 26, 1934   Referred by: Glendale Chard, MD Reason for referral : Chronic Care Management (RN CM Follow up call - 1st attempt )   An unsuccessful telephone outreach was attempted today. The patient was referred to the case management team for assistance with care management and care coordination.   Follow Up Plan: A HIPPA compliant phone message was left for the patient providing contact information and requesting a return call.   Barb Merino, RN, BSN, CCM Care Management Coordinator [facility]  Direct Phone: [phone]

## 2021-02-06 DIAGNOSIS — E119 Type 2 diabetes mellitus without complications: Secondary | ICD-10-CM | POA: Diagnosis not present

## 2021-02-06 DIAGNOSIS — H532 Diplopia: Secondary | ICD-10-CM | POA: Diagnosis not present

## 2021-02-06 DIAGNOSIS — Z961 Presence of intraocular lens: Secondary | ICD-10-CM | POA: Diagnosis not present

## 2021-02-06 DIAGNOSIS — H10413 Chronic giant papillary conjunctivitis, bilateral: Secondary | ICD-10-CM | POA: Diagnosis not present

## 2021-02-06 DIAGNOSIS — H04123 Dry eye syndrome of bilateral lacrimal glands: Secondary | ICD-10-CM | POA: Diagnosis not present

## 2021-02-06 LAB — HM DIABETES EYE EXAM

## 2021-02-07 ENCOUNTER — Encounter: Payer: Self-pay | Admitting: Internal Medicine

## 2021-02-07 ENCOUNTER — Other Ambulatory Visit: Payer: Self-pay

## 2021-02-07 MED ORDER — ONETOUCH VERIO VI STRP: ORAL_STRIP | 11 refills | Status: DC

## 2021-02-07 MED ORDER — ONETOUCH ULTRASOFT LANCETS MISC: 12 refills | Status: DC

## 2021-02-08 ENCOUNTER — Encounter: Payer: Self-pay | Admitting: Internal Medicine

## 2021-02-14 ENCOUNTER — Ambulatory Visit (INDEPENDENT_AMBULATORY_CARE_PROVIDER_SITE_OTHER): Payer: Medicare (Managed Care)

## 2021-02-14 ENCOUNTER — Ambulatory Visit: Payer: Medicare Other | Admitting: Internal Medicine

## 2021-02-14 VITALS — Ht 65.5 in | Wt 138.0 lb

## 2021-02-14 DIAGNOSIS — Z23 Encounter for immunization: Secondary | ICD-10-CM | POA: Diagnosis not present

## 2021-02-14 DIAGNOSIS — Z0000 Encounter for general adult medical examination without abnormal findings: Secondary | ICD-10-CM

## 2021-02-14 MED ORDER — BOOSTRIX 5-2.5-18.5 LF-MCG/0.5 IM SUSP: 0.5000 mL | Freq: Once | INTRAMUSCULAR | 0 refills | Status: AC

## 2021-02-14 NOTE — Progress Notes (Signed)
I connected with Anna Hoffman today by telephone and verified that I am speaking with the correct person using two identifiers. Location patient: home Location provider: work Persons participating in the virtual visit: Lizabeth, Fellner LPN.   I discussed the limitations, risks, security and privacy concerns of performing an evaluation and management service by telephone and the availability of in person appointments. I also discussed with the patient that there may be a patient responsible charge related to this service. The patient expressed understanding and verbally consented to this telephonic visit.    Interactive audio and video telecommunications were attempted between this provider and patient, however failed, due to patient having technical difficulties OR patient did not have access to video capability.  We continued and completed visit with audio only.     Vital signs may be patient reported or missing.  Subjective:   Anna Hoffman is a 86 y.o. female who presents for Medicare Annual (Subsequent) preventive examination.  Review of Systems     Cardiac Risk Factors include: advanced age (>46mn, >>33women);diabetes mellitus;dyslipidemia;hypertension     Objective:    Today's Vitals   02/14/21 1432 02/14/21 1433  Weight: 138 lb (62.6 kg)   Height: 5' 5.5" (1.664 m)   PainSc:  10-Worst pain ever   Body mass index is 22.62 kg/m.  Advanced Directives 02/14/2021 11/15/2020 10/10/2020 10/09/2020 02/09/2020 01/31/2020 11/30/2019  Does Patient Have a Medical Advance Directive? Yes No _0   Type of [place] will [place] will [place] will Living will -  Does patient want to make changes to medical advance directive? - - No - Patient declined - - - Yes (ED - Information included in AVS)  [place] of [place]  Chart? No - copy requested - - - No - copy requested - -  Would patient like information on creating a medical advance directive? - Yes (ED - Information included in AVS) - - - - -    Current Medications (verified) Outpatient Encounter Medications as of 02/14/2021  Medication Sig   acetaminophen (TYLENOL) 325 MG tablet Take 2 tablets (650 mg total) by mouth every 6 (six) hours as needed for mild pain (or Fever >/= 101).   amLODipine (NORVASC) 10 MG tablet TAKE 1 TABLET BY MOUTH ONCE DAILY (Patient taking differently: Take 10 mg by mouth daily.)   Ascorbic Acid (VITAMIN C) 1000 MG tablet Take 1,000 mg by mouth daily.   azelastine (ASTELIN) 0.1 % nasal spray Place 2 sprays into both nostrils daily as needed for rhinitis.   Carboxymethylcellul-Glycerin (LUBRICATING EYE DROPS OP) Apply 1 drop to eye daily as needed (dry eyes).    Fexofenadine HCl (ALLEGRA PO) Take 180 mg by mouth at bedtime.    glucose blood (ONETOUCH VERIO) test strip Use as directed to check blood sugars 1 time per day dx: e11.65   Lancets (ONETOUCH ULTRASOFT) lancets Use as instructed to check blood sugar 1 times per day dx code e11.65   pantoprazole (PROTONIX) 40 MG tablet TAKE 1 TABLET BY MOUTH TWICE A DAY BEFORE MEALS   rosuvastatin (CRESTOR) 20 MG tablet TAKE 1 TABLET BY MOUTH ONCE DAILY   Tdap (BOOSTRIX) 5-2.5-18.5 LF-MCG/0.5 injection Inject 0.5 mLs into the muscle once for 1 dose.   trolamine salicylate (ASPERCREME) 10 % cream Apply 1 application topically daily as needed for muscle pain.   vitamin B-12 (  CYANOCOBALAMIN) 100 MCG tablet Take 100 mcg by mouth daily.   benzonatate (TESSALON PERLES) 100 MG capsule Take 1 capsule (100 mg total) by mouth 3 (three) times daily as needed for cough. (Patient not taking: No sig reported)   carvedilol (COREG) 3.125 MG tablet Take 1 tablet (3.125 mg total) by mouth 2 (two) times daily with a meal. (Patient not taking: Reported on 02/14/2021)   sucralfate (CARAFATE) 1 GM/10ML suspension  TAKE 10 MLS (1 G TOTAL) BY MOUTH 4 (FOUR) TIMES DAILY - WITH MEALS AND AT BEDTIME FOR 28 DAYS.   triamcinolone (KENALOG) 0.025 % cream Apply 1 application topically 2 (two) times daily. As needed (Patient not taking: Reported on 02/14/2021)   [DISCONTINUED] amLODipine (NORVASC) 10 MG tablet TAKE 1 TABLET BY MOUTH ONCE DAILY   [DISCONTINUED] aspirin EC 81 MG tablet Take 81 mg by mouth daily.   [DISCONTINUED] azelastine (ASTELIN) 0.1 % nasal spray Place 2 sprays into both nostrils 2 (two) times daily. Use in each nostril as directed (Patient taking differently: Place 2 sprays into both nostrils daily as needed for rhinitis.)   [DISCONTINUED] Cyanocobalamin (B-12 COMPLIANCE INJECTION) 1000 MCG/ML KIT Inject 1,000 mcg as directed every 30 (thirty) days.   [DISCONTINUED] glucose blood (ONETOUCH VERIO) test strip 1 each by Other route as needed for other. Use as instructed to check blood sugar 2 times per day dx: e11.65   [DISCONTINUED] irbesartan (AVAPRO) 150 MG tablet TAKE 1 TABLET BY MOUTH EVERY DAY. (Patient taking differently: Take 150 mg by mouth daily.)   [DISCONTINUED] Lancets (ONETOUCH ULTRASOFT) lancets Use as instructed to check blood sugar 2 times per day dx code e11.65   [DISCONTINUED] meclizine (ANTIVERT) 12.5 MG tablet Take 1 tablet (12.5 mg total) by mouth 3 (three) times daily as needed for dizziness. (Patient not taking: No sig reported)   [DISCONTINUED] Multiple Vitamins-Minerals (STRESS TAB NF PO) Take 1 tablet by mouth daily.   [DISCONTINUED] olopatadine (PATANOL) 0.1 % ophthalmic solution Place 1 drop into both eyes 2 (two) times daily. (Patient not taking: Reported on 10/09/2020)   [DISCONTINUED] omeprazole (PRILOSEC) 20 MG capsule TAKE 1 CAPSULE BY MOUTH ONCE DAILY 30 MINUTES TO 1 HOUR BEFORE A MEAL   [DISCONTINUED] PAZEO 0.7 % SOLN INSTILL 1 DROP INTO BOTH EYES DAILY. (Patient not taking: Reported on 10/09/2020)   [DISCONTINUED] rosuvastatin (CRESTOR) 20 MG tablet TAKE 1 TABLET BY MOUTH  ONCE DAILY   No facility-administered encounter medications on file as of 02/14/2021.    Allergies (verified) Amoxicillin, Ampicillin, and Sulfa antibiotics   History: Past Medical History:  Diagnosis Date   Arthritis    Diabetes mellitus without complication ([place])    Hypertension    Hypokalemia    Pneumonia    Shingles    Stroke [facility])    Past Surgical History:  Procedure Laterality Date   ABDOMINAL HYSTERECTOMY     BREAST EXCISIONAL BIOPSY Left    ESOPHAGOGASTRODUODENOSCOPY N/A 09/01/2014   Procedure: ESOPHAGOGASTRODUODENOSCOPY (EGD);  Surgeon: Lear Ng, MD;  Location: [facility];  Service: Endoscopy;  Laterality: N/A;   ESOPHAGOGASTRODUODENOSCOPY (EGD) WITH PROPOFOL N/A 10/12/2020   Procedure: ESOPHAGOGASTRODUODENOSCOPY (EGD) WITH PROPOFOL;  Surgeon: Mauri Pole, MD;  Location: [facility];  Service: Endoscopy;  Laterality: N/A;   HIATAL HERNIA REPAIR N/A 09/04/2014   Procedure: LAPAROSCOPIC REPAIR OF HIATAL HERNIA;  Surgeon: Excell Seltzer, MD;  Location: [facility]S;  Service: General;  Laterality: N/A;  With MESH   IR GENERIC HISTORICAL  04/10/2016   IR US GUIDE VASC  ACCESS RIGHT 04/10/2016 Corrie Mckusick, DO [facility]   IR GENERIC HISTORICAL  04/10/2016   IR ANGIOGRAM SELECTIVE EACH ADDITIONAL VESSEL 04/10/2016 Corrie Mckusick, DO [facility]   IR GENERIC HISTORICAL  04/10/2016   IR ANGIOGRAM SELECTIVE EACH ADDITIONAL VESSEL 04/10/2016 Corrie Mckusick, DO [facility]   IR GENERIC HISTORICAL  04/10/2016   IR EMBO TUMOR ORGAN ISCHEMIA INFARCT INC GUIDE ROADMAPPING 04/10/2016 Corrie Mckusick, DO [facility]   IR GENERIC HISTORICAL  04/10/2016   IR ANGIOGRAM SELECTIVE EACH ADDITIONAL VESSEL 04/10/2016 Corrie Mckusick, DO [facility]   IR GENERIC HISTORICAL  04/10/2016   IR ANGIOGRAM SELECTIVE EACH ADDITIONAL VESSEL 04/10/2016 Corrie Mckusick, DO [facility]   IR GENERIC HISTORICAL  04/10/2016   IR RENAL SELECTIVE  UNI INC S&I MOD SED 04/10/2016 Corrie Mckusick, DO [facility]   IR GENERIC  HISTORICAL  03/06/2016   IR RADIOLOGIST EVAL & MGMT 03/06/2016 Corrie Mckusick, DO [facility]   IR GENERIC HISTORICAL  03/26/2016   IR RADIOLOGIST EVAL & MGMT 03/26/2016 Corrie Mckusick, DO [facility]   IR GENERIC HISTORICAL  04/29/2016   IR RADIOLOGIST EVAL & MGMT 04/29/2016 [facility]   IR RADIOLOGIST EVAL & MGMT  11/12/2016   IR RADIOLOGIST EVAL & MGMT  12/16/2017   KNEE SURGERY     TONSILLECTOMY     Family History  Problem Relation Age of Onset   Alzheimer's disease Mother    Prostate cancer Father    Brain cancer Brother    Brain cancer Brother    Pancreatic cancer Sister    Allergic rhinitis Neg Hx    Asthma Neg Hx    Eczema Neg Hx    Urticaria Neg Hx    Social History   Socioeconomic History   Marital status: Widowed    Spouse name: Not on file   Number of children: 2   Years of education: Not on file   Highest education level: Not on file  Occupational History   Occupation: retired  Tobacco Use   Smoking status: Never   Smokeless tobacco: Never  Vaping Use   Vaping Use: Never used  Substance and Sexual Activity   Alcohol use: No   Drug use: No   Sexual activity: Not Currently    Birth control/protection: Surgical  Other Topics Concern   Not on file  Social History Narrative   Son [name] lives with her   Social Determinants of Health   Financial Resource Strain: Low Risk    Difficulty of Paying Living Expenses: Not hard at all  Food Insecurity: No Food Insecurity   Worried About Charity fundraiser in the Last Year: Never true   Arboriculturist in the Last Year: Never true  Transportation Needs: No Transportation Needs   Lack of Transportation (Medical): No   Lack of Transportation (Non-Medical): No  Physical Activity: Sufficiently Active   Days of Exercise per Week: 6 days   Minutes of Exercise per Session: 30 min  Stress: No Stress Concern Present   Feeling of Stress : Not at all  Social Connections: Not on file    Tobacco  Counseling Counseling given: Not Answered   Clinical Intake:  Pre-visit preparation completed: Yes  Pain : 0-10 Pain Score: 10-Worst pain ever Pain Type: Chronic pain Pain Location: Back Pain Orientation: Lower Pain Onset: More than a month ago Pain Frequency: Constant Pain Relieving Factors: APAP  Pain Relieving Factors: APAP  Nutritional Status: BMI of 19-24  Normal  Nutritional Risks: None Diabetes: Yes  How often do you need to have someone help you when you read instructions, pamphlets, or other written materials from your doctor or pharmacy?: 1 - Never What is the last grade level you completed in school?: 12th grade  Diabetic? Yes Nutrition Risk Assessment:  Has the patient had any N/V/D within the last 2 months?  No  Does the patient have any non-healing wounds?  No  Has the patient had any unintentional weight loss or weight gain?  No   Diabetes:  Is the patient diabetic?  Yes  If diabetic, was a CBG obtained today?  No  Did the patient bring in their glucometer from home?  No  How often do you monitor your CBG's? daily.   Financial Strains and Diabetes Management:  Are you having any financial strains with the device, your supplies or your medication? No .  Does the patient want to be seen by Chronic Care Management for management of their diabetes?  No  Would the patient like to be referred to a Nutritionist or for Diabetic Management?  No   Diabetic Exams:  Diabetic Eye Exam: Completed 02/06/2021 Diabetic Foot Exam: Completed 06/12/2020   Interpreter Needed?: No  Information entered by :: NAllen LPN   Activities of Daily Living In your present state of health, do you have any difficulty performing the following activities: 02/14/2021 11/15/2020  Hearing? N Y  Vision? N Y  Comment - Wears glasses  Difficulty concentrating or making decisions? N N  Walking or climbing stairs? Y Y  Dressing or bathing? N N  Doing errands, shopping? Tempie Donning  Comment  someone drives her -  Conservation officer, nature and eating ? N -  Using the Toilet? N -  In the past six months, have you accidently leaked urine? N -  Do you have problems with loss of bowel control? N -  Managing your Medications? N -  Managing your Finances? N -  Housekeeping or managing your Housekeeping? N -  Some recent data might be hidden    Patient Care Team: Gl[place]ale Chard, MD as PCP - General (Internal Medicine) Daneen Schick as Social Worker [place], Claudette Stapler, RN as Case Manager  Indicate any recent Medical Services you may have received from other than [facility] providers in the past year (date may be approximate).     Assessment:   This is a routine wellness examination for [place].  Hearing/Vision screen Vision Screening - Comments:: Regular eye exams, Dr. Katy Fitch  Dietary issues and exercise activities discussed: Current Exercise Habits: Home exercise routine, Type of exercise: walking (stationary bike), Time (Minutes): 30, Frequency (Times/Week): 6, Weekly Exercise (Minutes/Week): 180   Goals Addressed             This Visit's Progress    Patient Stated       02/14/2021, get strength back and walk without falling       Depression Screen PHQ 2/9 Scores 02/14/2021 02/09/2020 04/19/2019 04/06/2019 03/24/2019 02/16/2019 02/07/2019  PHQ - 2 Score 0 0 0 0 0 0 0  PHQ- 9 Score - 3 - 3 - - -    Fall Risk Fall Risk  02/14/2021 02/09/2020 05/26/2019 04/06/2019 03/24/2019  Falls in the past year? 1 1 0 0 0  Comment had 5 falls loses balance loss balance looking in refrigerator - - -  Number falls in past yr: 1 0 - - -  Injury with Fall? 0 0 - - -  Comment - - - - -  Risk for fall due to : Impaired mobility;Impaired balance/gait;Medication side effect History of fall(s);Impaired balance/gait;Medication side effect - Impaired balance/gait -  Risk for fall due to: Comment - - - - -  Follow up Falls evaluation completed;Education provided;Falls prevention discussed Falls evaluation  completed;Education provided;Falls prevention discussed - Falls evaluation completed;Education provided;Falls prevention discussed -    FALL RISK PREVENTION PERTAINING TO THE HOME:  Any stairs in or around the home? Yes  If so, are there any without handrails? No  Home free of loose throw rugs in walkways, pet beds, electrical cords, etc? Yes  Adequate lighting in your home to reduce risk of falls? Yes   ASSISTIVE DEVICES UTILIZED TO PREVENT FALLS:  Life alert? No  Use of a cane, walker or w/c? Yes  Grab bars in the bathroom? No  Shower chair or bench in shower? Yes  Elevated toilet seat or a handicapped toilet? No   TIMED UP AND GO:  Was the test performed? No .      Cognitive Function:     6CIT Screen 02/14/2021 02/09/2020 04/06/2019  What Year? 0 points 0 points 0 points  What month? 0 points 0 points 0 points  What time? 0 points 0 points 0 points  Count back from 20 2 points 2 points 0 points  Months in reverse 0 points 0 points 2 points  Repeat phrase 10 points 4 points 0 points  Total Score _0 Immunizations Immunization History  Administered Date(s) Administered   Fluad Quad(high Dose 65+) 05/08/2020   Influenza, High Dose Seasonal PF 04/12/2019   Influenza-Unspecified 06/29/2018   PFIZER(Purple Top)SARS-COV-2 Vaccination 10/06/2019, 10/31/2019, 08/14/2020   Zoster Recombinat (Shingrix) 09/01/2019, 12/29/2019    TDAP status: Due, Education has been provided regarding the importance of this vaccine. Advised may receive this vaccine at local pharmacy or Health Dept. Aware to provide a copy of the vaccination record if obtained from local pharmacy or Health Dept. Verbalized acceptance and understanding.  Flu Vaccine status: Up to date  Pneumococcal vaccine status: Due, Education has been provided regarding the importance of this vaccine. Advised may receive this vaccine at local pharmacy or Health Dept. Aware to provide a copy of the vaccination record if  obtained from local pharmacy or Health Dept. Verbalized acceptance and understanding.  Covid-19 vaccine status: Completed vaccines  Qualifies for Shingles Vaccine? Yes   Zostavax completed No   Shingrix Completed?: Yes  Screening Tests Health Maintenance  Topic Date Due   TETANUS/TDAP  Never done   PNA vac Low Risk Adult (2 of 2 - PPSV23) 12/04/2012   COVID-19 Vaccine (4 - Booster for Pfizer series) 11/12/2020   INFLUENZA VACCINE  03/04/2021   HEMOGLOBIN A1C  05/17/2021   FOOT EXAM  06/12/2021   URINE MICROALBUMIN  06/12/2021   OPHTHALMOLOGY EXAM  02/06/2022   DEXA SCAN  Completed   Zoster Vaccines- Shingrix  Completed   HPV VACCINES  Aged Out    Health Maintenance  Health Maintenance Due  Topic Date Due   TETANUS/TDAP  Never done   PNA vac Low Risk Adult (2 of 2 - PPSV23) 12/04/2012   COVID-19 Vaccine (4 - Booster for Pfizer series) 11/12/2020    Colorectal cancer screening: No longer required.   Mammogram status: Completed 06/15/2020. Repeat every year  Bone Density status: Completed 06/11/2016.   Lung Cancer Screening: (Low Dose CT Chest recommended if Age 37-80 years, 30 pack-year currently smoking OR have quit w/in 15years.) does not qualify.  Lung Cancer Screening Referral: no  Additional Screening:  Hepatitis C Screening: does not qualify;   Vision Screening: Recommended annual ophthalmology exams for early detection of glaucoma and other disorders of the eye. Is the patient up to date with their annual eye exam?  Yes  Who is the provider or what is the name of the office in which the patient attends annual eye exams? Dr. Katy Fitch If pt is not established with a provider, would they like to be referred to a provider to establish care? No .   Dental Screening: Recommended annual dental exams for proper oral hygiene  Community Resource Referral / Chronic Care Management: CRR required this visit?  No   CCM required this visit?  No      Plan:     I have  personally reviewed and noted the following in the patient's chart:   Medical and social history Use of alcohol, tobacco or illicit drugs  Current medications and supplements including opioid prescriptions.  Functional ability and status Nutritional status Physical activity Advanced directives List of other physicians Hospitalizations, surgeries, and ER visits in previous 12 months Vitals Screenings to include cognitive, depression, and falls Referrals and appointments  In addition, I have reviewed and discussed with patient certain preventive protocols, quality metrics, and best practice recommendations. A written personalized care plan for preventive services as well as general preventive health recommendations were provided to patient.     Kellie Simmering, LPN   [id number]   Nurse Notes:

## 2021-02-14 NOTE — Patient Instructions (Signed)
Anna Hoffman , Thank you for taking time to come for your Medicare Wellness Visit. I appreciate your ongoing commitment to your health goals. Please review the following plan we discussed and let me know if I can assist you in the future.   Screening recommendations/referrals: Colonoscopy: not required Mammogram: completed 06/15/2020 Bone Density: completed 06/11/2016 Recommended yearly ophthalmology/optometry visit for glaucoma screening and checkup Recommended yearly dental visit for hygiene and checkup  Vaccinations: Influenza vaccine: completed 05/08/2020, due 03/04/2021 Pneumococcal vaccine: due Tdap vaccine: sent to pharmacy Shingles vaccine: completed   Covid-19: 08/14/2020, 10/31/2019, 10/06/2019  Advanced directives: Please bring a copy of your POA (Power of Attorney) and/or Living Will to your next appointment.   Conditions/risks identified: none  Next appointment: Follow up in one year for your annual wellness visit    Preventive Care 65 Years and Older, Female Preventive care refers to lifestyle choices and visits with your health care provider that can promote health and wellness. What does preventive care include? A yearly physical exam. This is also called an annual well check. Dental exams once or twice a year. Routine eye exams. Ask your health care provider how often you should have your eyes checked. Personal lifestyle choices, including: Daily care of your teeth and gums. Regular physical activity. Eating a healthy diet. Avoiding tobacco and drug use. Limiting alcohol use. Practicing safe sex. Taking low-dose aspirin every day. Taking vitamin and mineral supplements as recommended by your health care provider. What happens during an annual well check? The services and screenings done by your health care provider during your annual well check will depend on your age, overall health, lifestyle risk factors, and family history of disease. Counseling  Your health care  provider may ask you questions about your: Alcohol use. Tobacco use. Drug use. Emotional well-being. Home and relationship well-being. Sexual activity. Eating habits. History of falls. Memory and ability to understand (cognition). Work and work Statistician. Reproductive health. Screening  You may have the following tests or measurements: Height, weight, and BMI. Blood pressure. Lipid and cholesterol levels. These may be checked every 5 years, or more frequently if you are over 17 years old. Skin check. Lung cancer screening. You may have this screening every year starting at age 42 if you have a 30-pack-year history of smoking and currently smoke or have quit within the past 15 years. Fecal occult blood test (FOBT) of the stool. You may have this test every year starting at age 77. Flexible sigmoidoscopy or colonoscopy. You may have a sigmoidoscopy every 5 years or a colonoscopy every 10 years starting at age 43. Hepatitis C blood test. Hepatitis B blood test. Sexually transmitted disease (STD) testing. Diabetes screening. This is done by checking your blood sugar (glucose) after you have not eaten for a while (fasting). You may have this done every 1-3 years. Bone density scan. This is done to screen for osteoporosis. You may have this done starting at age 31. Mammogram. This may be done every 1-2 years. Talk to your health care provider about how often you should have regular mammograms. Talk with your health care provider about your test results, treatment options, and if necessary, the need for more tests. Vaccines  Your health care provider may recommend certain vaccines, such as: Influenza vaccine. This is recommended every year. Tetanus, diphtheria, and acellular pertussis (Tdap, Td) vaccine. You may need a Td booster every 10 years. Zoster vaccine. You may need this after age 3. Pneumococcal 13-valent conjugate (PCV13) vaccine. One dose  is recommended after age  65. Pneumococcal polysaccharide (PPSV23) vaccine. One dose is recommended after age 7. Talk to your health care provider about which screenings and vaccines you need and how often you need them. This information is not intended to replace advice given to you by your health care provider. Make sure you discuss any questions you have with your health care provider. Document Released: 08/17/2015 Document Revised: 04/09/2016 Document Reviewed: 05/22/2015 Elsevier Interactive Patient Education  2017 [place] Prevention in the Home Falls can cause injuries. They can happen to people of all ages. There are many things you can do to make your home safe and to help prevent falls. What can I do on the outside of my home? Regularly fix the edges of walkways and driveways and fix any cracks. Remove anything that might make you trip as you walk through a door, such as a raised step or threshold. Trim any bushes or trees on the path to your home. Use bright outdoor lighting. Clear any walking paths of anything that might make someone trip, such as rocks or tools. Regularly check to see if handrails are loose or broken. Make sure that both sides of any steps have handrails. Any raised decks and porches should have guardrails on the edges. Have any leaves, snow, or ice cleared regularly. Use sand or salt on walking paths during winter. Clean up any spills in your garage right away. This includes oil or grease spills. What can I do in the bathroom? Use night lights. Install grab bars by the toilet and in the tub and shower. Do not use towel bars as grab bars. Use non-skid mats or decals in the tub or shower. If you need to sit down in the shower, use a plastic, non-slip stool. Keep the floor dry. Clean up any water that spills on the floor as soon as it happens. Remove soap buildup in the tub or shower regularly. Attach bath mats securely with double-sided non-slip rug tape. Do not have throw  rugs and other things on the floor that can make you trip. What can I do in the bedroom? Use night lights. Make sure that you have a light by your bed that is easy to reach. Do not use any sheets or blankets that are too big for your bed. They should not hang down onto the floor. Have a firm chair that has side arms. You can use this for support while you get dressed. Do not have throw rugs and other things on the floor that can make you trip. What can I do in the kitchen? Clean up any spills right away. Avoid walking on wet floors. Keep items that you use a lot in easy-to-reach places. If you need to reach something above you, use a strong step stool that has a grab bar. Keep electrical cords out of the way. Do not use floor polish or wax that makes floors slippery. If you must use wax, use non-skid floor wax. Do not have throw rugs and other things on the floor that can make you trip. What can I do with my stairs? Do not leave any items on the stairs. Make sure that there are handrails on both sides of the stairs and use them. Fix handrails that are broken or loose. Make sure that handrails are as long as the stairways. Check any carpeting to make sure that it is firmly attached to the stairs. Fix any carpet that is loose or worn. Avoid  having throw rugs at the top or bottom of the stairs. If you do have throw rugs, attach them to the floor with carpet tape. Make sure that you have a light switch at the top of the stairs and the bottom of the stairs. If you do not have them, ask someone to add them for you. What else can I do to help prevent falls? Wear shoes that: Do not have high heels. Have rubber bottoms. Are comfortable and fit you well. Are closed at the toe. Do not wear sandals. If you use a stepladder: Make sure that it is fully opened. Do not climb a closed stepladder. Make sure that both sides of the stepladder are locked into place. Ask someone to hold it for you, if  possible. Clearly mark and make sure that you can see: Any grab bars or handrails. First and last steps. Where the edge of each step is. Use tools that help you move around (mobility aids) if they are needed. These include: Canes. Walkers. Scooters. Crutches. Turn on the lights when you go into a dark area. Replace any light bulbs as soon as they burn out. Set up your furniture so you have a clear path. Avoid moving your furniture around. If any of your floors are uneven, fix them. If there are any pets around you, be aware of where they are. Review your medicines with your doctor. Some medicines can make you feel dizzy. This can increase your chance of falling. Ask your doctor what other things that you can do to help prevent falls. This information is not intended to replace advice given to you by your health care provider. Make sure you discuss any questions you have with your health care provider. Document Released: 05/17/2009 Document Revised: 12/27/2015 Document Reviewed: 08/25/2014 Elsevier Interactive Patient Education  2017 [employer].

## 2021-02-20 ENCOUNTER — Encounter: Payer: Self-pay | Admitting: Nurse Practitioner

## 2021-02-20 ENCOUNTER — Ambulatory Visit (INDEPENDENT_AMBULATORY_CARE_PROVIDER_SITE_OTHER): Payer: Medicare (Managed Care) | Admitting: Nurse Practitioner

## 2021-02-20 ENCOUNTER — Other Ambulatory Visit: Payer: Self-pay

## 2021-02-20 VITALS — BP 132/70 | HR 74 | Temp 98.3°F

## 2021-02-20 DIAGNOSIS — I129 Hypertensive chronic kidney disease with stage 1 through stage 4 chronic kidney disease, or unspecified chronic kidney disease: Secondary | ICD-10-CM | POA: Diagnosis not present

## 2021-02-20 DIAGNOSIS — R0981 Nasal congestion: Secondary | ICD-10-CM

## 2021-02-20 DIAGNOSIS — E1122 Type 2 diabetes mellitus with diabetic chronic kidney disease: Secondary | ICD-10-CM

## 2021-02-20 DIAGNOSIS — M545 Low back pain, unspecified: Secondary | ICD-10-CM | POA: Diagnosis not present

## 2021-02-20 DIAGNOSIS — J069 Acute upper respiratory infection, unspecified: Secondary | ICD-10-CM

## 2021-02-20 DIAGNOSIS — N1831 Chronic kidney disease, stage 3a: Secondary | ICD-10-CM | POA: Diagnosis not present

## 2021-02-20 DIAGNOSIS — G8929 Other chronic pain: Secondary | ICD-10-CM

## 2021-02-20 MED ORDER — DOXYCYCLINE MONOHYDRATE 100 MG PO CAPS: 100.0000 mg | ORAL_CAPSULE | Freq: Two times a day (BID) | ORAL | 0 refills | Status: AC

## 2021-02-20 MED ORDER — TRIAMCINOLONE ACETONIDE 40 MG/ML IJ SUSP
40.0000 mg | Freq: Once | INTRAMUSCULAR | Status: AC
  Administered 2021-02-20: 40 mg via INTRAMUSCULAR

## 2021-02-20 NOTE — Patient Instructions (Signed)
Acute Back Pain, Adult Acute back pain is sudden and usually short-lived. It is often caused by an injury to the muscles and tissues in the back. The injury may result from: A muscle or ligament getting overstretched or torn (strained). Ligaments are tissues that connect bones to each other. Lifting something improperly can cause a back strain. Wear and tear (degeneration) of the spinal disks. Spinal disks are circular tissue that provide cushioning between the bones of the spine (vertebrae). Twisting motions, such as while playing sports or doing yard work. A hit to the back. Arthritis. You may have a physical exam, lab tests, and imaging tests to find the cause ofyour pain. Acute back pain usually goes away with rest and home care. Follow these instructions at home: Managing pain, stiffness, and swelling Treatment may include medicines for pain and inflammation that are taken by mouth or applied to the skin, prescription pain medicine, or muscle relaxants. Take over-the-counter and prescription medicines only as told by your health care provider. Your health care provider may recommend applying ice during the first 24-48 hours after your pain starts. To do this: Put ice in a plastic bag. Place a towel between your skin and the bag. Leave the ice on for 20 minutes, 2-3 times a day. If directed, apply heat to the affected area as often as told by your health care provider. Use the heat source that your health care provider recommends, such as a moist heat pack or a heating pad. Place a towel between your skin and the heat source. Leave the heat on for 20-30 minutes. Remove the heat if your skin turns bright red. This is especially important if you are unable to feel pain, heat, or cold. You have a greater risk of getting burned. Activity  Do not stay in bed. Staying in bed for more than 1-2 days can delay your recovery. Sit up and stand up straight. Avoid leaning forward when you sit or  hunching over when you stand. If you work at a desk, sit close to it so you do not need to lean over. Keep your chin tucked in. Keep your neck drawn back, and keep your elbows bent at a 90-degree angle (right angle). Sit high and close to the steering wheel when you drive. Add lower back (lumbar) support to your car seat, if needed. Take short walks on even surfaces as soon as you are able. Try to increase the length of time you walk each day. Do not sit, drive, or stand in one place for more than 30 minutes at a time. Sitting or standing for long periods of time can put stress on your back. Do not drive or use heavy machinery while taking prescription pain medicine. Use proper lifting techniques. When you bend and lift, use positions that put less stress on your back: Bend your knees. Keep the load close to your body. Avoid twisting. Exercise regularly as told by your health care provider. Exercising helps your back heal faster and helps prevent back injuries by keeping muscles strong and flexible. Work with a physical therapist to make a safe exercise program, as recommended by your health care provider. Do any exercises as told by your physical therapist.  Lifestyle Maintain a healthy weight. Extra weight puts stress on your back and makes it difficult to have good posture. Avoid activities or situations that make you feel anxious or stressed. Stress and anxiety increase muscle tension and can make back pain worse. Learn ways to manage   anxiety and stress, such as through exercise. General instructions Sleep on a firm mattress in a comfortable position. Try lying on your side with your knees slightly bent. If you lie on your back, put a pillow under your knees. Follow your treatment plan as told by your health care provider. This may include: Cognitive or behavioral therapy. Acupuncture or massage therapy. Meditation or yoga. Contact a health care provider if: You have pain that is not  relieved with rest or medicine. You have increasing pain going down into your legs or buttocks. Your pain does not improve after 2 weeks. You have pain at night. You lose weight without trying. You have a fever or chills. Get help right away if: You develop new bowel or bladder control problems. You have unusual weakness or numbness in your arms or legs. You develop nausea or vomiting. You develop abdominal pain. You feel faint. Summary Acute back pain is sudden and usually short-lived. Use proper lifting techniques. When you bend and lift, use positions that put less stress on your back. Take over-the-counter and prescription medicines and apply heat or ice as directed by your health care provider. This information is not intended to replace advice given to you by your health care provider. Make sure you discuss any questions you have with your healthcare provider. Document Revised: 04/10/2020 Document Reviewed: 04/13/2020 Elsevier Patient Education  2022 Elsevier Inc.  

## 2021-02-20 NOTE — Progress Notes (Signed)
I,Tianna Badgett,acting as a Education administrator for [employer], NP.,have documented all relevant documentation on the behalf of [employer], NP,as directed by  Bary Castilla, NP while in the presence of Bary Castilla, NP.  This visit occurred during the SARS-CoV-2 public health emergency.  Safety protocols were in place, including screening questions prior to the visit, additional usage of staff PPE, and extensive cleaning of exam room while observing appropriate contact time as indicated for disinfecting solutions.  Subjective:     Patient ID: Anna Hoffman , female    DOB: 1935-05-16 , 86 y.o.   MRN: [id number]   Chief Complaint  Patient presents with   Back Pain   Hypertension   Diabetes    HPI  Patient is here for htn and dm. She has complaints of back pain today.   Back Pain Pertinent negatives include no chest pain, fever or headaches.  Diabetes She presents for her follow-up diabetic visit. She has type 2 diabetes mellitus. Her disease course has been stable. There are no hypoglycemic associated symptoms. Pertinent negatives for hypoglycemia include no headaches. Pertinent negatives for diabetes include no blurred vision and no chest pain. There are no hypoglycemic complications. Risk factors for coronary artery disease include diabetes mellitus, dyslipidemia, hypertension, stress and post-menopausal. She is compliant with treatment most of the time. Her weight is stable. She is following a diabetic diet. Her breakfast blood glucose is taken between 7-8 am. Her breakfast blood glucose range is generally 90-110 mg/dl.  Hypertension This is a chronic problem. The current episode started more than 1 year ago. The problem has been gradually improving since onset. The problem is controlled. Pertinent negatives include no blurred vision, chest pain, headaches, palpitations or shortness of breath.    Past Medical History:  Diagnosis Date   Arthritis    Diabetes mellitus  without complication ([place])    Hypertension    Hypokalemia    Pneumonia    Shingles    Stroke [facility])      Family History  Problem Relation Age of Onset   Alzheimer's disease Mother    Prostate cancer Father    Brain cancer Brother    Brain cancer Brother    Pancreatic cancer Sister    Allergic rhinitis Neg Hx    Asthma Neg Hx    Eczema Neg Hx    Urticaria Neg Hx      Current Outpatient Medications:    acetaminophen (TYLENOL) 325 MG tablet, Take 2 tablets (650 mg total) by mouth every 6 (six) hours as needed for mild pain (or Fever >/= 101)., Disp: , Rfl:    amLODipine (NORVASC) 10 MG tablet, TAKE 1 TABLET BY MOUTH ONCE DAILY (Patient taking differently: Take 10 mg by mouth daily.), Disp: 90 tablet, Rfl: 1   Ascorbic Acid (VITAMIN C) 1000 MG tablet, Take 1,000 mg by mouth daily., Disp: , Rfl:    azelastine (ASTELIN) 0.1 % nasal spray, Place 2 sprays into both nostrils daily as needed for rhinitis., Disp: , Rfl:    Carboxymethylcellul-Glycerin (LUBRICATING EYE DROPS OP), Apply 1 drop to eye daily as needed (dry eyes). , Disp: , Rfl:    Fexofenadine HCl (ALLEGRA PO), Take 180 mg by mouth at bedtime. , Disp: , Rfl:    glucose blood (ONETOUCH VERIO) test strip, Use as directed to check blood sugars 1 time per day dx: e11.65, Disp: 100 each, Rfl: 11   Lancets (ONETOUCH ULTRASOFT) lancets, Use as instructed to check blood sugar 1 times per day  dx code e11.65, Disp: 100 each, Rfl: 12   pantoprazole (PROTONIX) 40 MG tablet, TAKE 1 TABLET BY MOUTH TWICE A DAY BEFORE MEALS, Disp: 180 tablet, Rfl: 1   rosuvastatin (CRESTOR) 20 MG tablet, TAKE 1 TABLET BY MOUTH ONCE DAILY, Disp: 90 tablet, Rfl: 1   sucralfate (CARAFATE) 1 GM/10ML suspension, TAKE 10 MLS (1 G TOTAL) BY MOUTH 4 (FOUR) TIMES DAILY - WITH MEALS AND AT BEDTIME FOR 28 DAYS., Disp: 840 mL, Rfl: 1   trolamine salicylate (ASPERCREME) 10 % cream, Apply 1 application topically daily as needed for muscle pain., Disp: , Rfl:    vitamin B-12  (CYANOCOBALAMIN) 100 MCG tablet, Take 100 mcg by mouth daily., Disp: , Rfl:   Current Facility-Administered Medications:    triamcinolone acetonide (KENALOG-40) injection 40 mg, 40 mg, Intramuscular, Once, Lalanya Rufener, NP   Allergies  Allergen Reactions   Amoxicillin Diarrhea    Has patient had a PCN reaction causing immediate rash, facial/tongue/throat swelling, SOB or lightheadedness with hypotension: no Has patient had a PCN reaction causing severe rash involving mucus membranes or skin necrosis: no Has patient had a PCN reaction that required hospitalization: no pharmacist consult Has patient had a PCN reaction occurring within the last 10 years: yes If all of the above answers are "NO", then may proceed with Cephalosporin use.    Ampicillin Rash    - Tolerates Rocephin and Ancef - Remote occurrence; no symptoms of anaphylaxis or severe cutaneous reaction, and no additional medical attention required     Sulfa Antibiotics Rash     Review of Systems  Constitutional: Negative.  Negative for chills, fever and unexpected weight change.  HENT:  Positive for congestion and rhinorrhea. Negative for sinus pressure.   Eyes:  Negative for blurred vision.  Respiratory: Negative.  Negative for shortness of breath.   Cardiovascular: Negative.  Negative for chest pain and palpitations.  Gastrointestinal: Negative.   Musculoskeletal:  Positive for back pain.  Neurological: Negative.  Negative for headaches.    Today's Vitals   02/20/21 1553  BP: 132/70  Pulse: 74  Temp: 98.3 F (36.8 C)  TempSrc: Oral   There is no height or weight on file to calculate BMI.   Objective:  Physical Exam Constitutional:      Appearance: Normal appearance.  HENT:     Head: Normocephalic and atraumatic.     Nose: Congestion and rhinorrhea present.  Cardiovascular:     Rate and Rhythm: Normal rate and regular rhythm.     Pulses: Normal pulses.     Heart sounds: Normal heart sounds. No murmur  heard. Pulmonary:     Effort: Pulmonary effort is normal. No respiratory distress.     Breath sounds: Normal breath sounds. No wheezing.  Musculoskeletal:        General: Tenderness present.     Comments: Tenderness to the mid back   Skin:    General: Skin is warm and dry.     Capillary Refill: Capillary refill takes less than 2 seconds.  Neurological:     Mental Status: She is alert.        Assessment And Plan:     1. Type 2 diabetes mellitus with stage 3a chronic kidney disease, without long-term current use of insulin ([place]) --Discussed with patient the importance of glycemic control and long term complications from uncontrolled diabetes. Discussed with the patient the importance of compliance with home glucose monitoring, diet which includes decrease amount of sugary drinks and foods. Importance of  exercise was also discussed with the patient. Importance of eye exams, self foot care and compliance to office visits was also discussed with the patient.  - Hemoglobin A1c  2. Hypertensive nephropathy -Limit the intake of processed foods and salt intake. You should increase your intake of green vegetables and fruits. Limit the use of alcohol. Limit fast foods and fried foods. Avoid high fatty saturated and trans fat foods. Keep yourself hydrated with drinking water. Avoid red meats. Eat lean meats instead. Exercise for atleast 30-45 min for atleast 4-5 times a week.  - CMP14+EGFR - CBC no Diff  3. Chronic bilateral low back pain without sciatica -Followed by orthopedic  - triamcinolone acetonide (KENALOG-40) injection 40 mg   4. Nasal congestion - Advised patient to take OTC pain relievers as needed.  - doxycycline (MONODOX) 100 MG capsule; Take 1 capsule (100 mg total) by mouth 2 (two) times daily for 5 days.  Dispense: 10 capsule; Refill: 0  5. Upper respiratory tract infection, unspecified type - doxycycline (MONODOX) 100 MG capsule; Take 1 capsule (100 mg total) by mouth 2 (two)  times daily for 5 days.  Dispense: 10 capsule; Refill: 0  The patient was encouraged to call or send a message through [place] for any questions or concerns.   Follow up: if symptoms persist or do not get better.   Side effects and appropriate use of all the medication(s) were discussed with the patient today. Patient advised to use the medication(s) as directed by their healthcare provider. The patient was encouraged to read, review, and understand all associated package inserts and contact our office with any questions or concerns. The patient accepts the risks of the treatment plan and had an opportunity to ask questions.   Patient was given opportunity to ask questions. Patient verbalized understanding of the plan and was able to repeat key elements of the plan. All questions were answered to their satisfaction.  Raman Future Yeldell, DNP   I, Raman Cavan Bearden have reviewed all documentation for this visit. The documentation on 02/20/21 for the exam, diagnosis, procedures, and orders are all accurate and complete.    IF YOU HAVE BEEN REFERRED TO A SPECIALIST, IT MAY TAKE 1-2 WEEKS TO SCHEDULE/PROCESS THE REFERRAL. IF YOU HAVE NOT HEARD FROM US/SPECIALIST IN TWO WEEKS, PLEASE GIVE [place] A CALL AT [phone] X [phone].   THE PATIENT IS ENCOURAGED TO PRACTICE SOCIAL DISTANCING DUE TO THE COVID-19 PANDEMIC.

## 2021-02-21 LAB — CMP14+EGFR
ALT: 9 IU/L (ref 0–32)
AST: 14 IU/L (ref 0–40)
Albumin/Globulin Ratio: 2.1 (ref 1.2–2.2)
Albumin: 4.8 g/dL — ABNORMAL HIGH (ref 3.6–4.6)
Alkaline Phosphatase: 74 IU/L (ref 44–121)
BUN/Creatinine Ratio: 18 (ref 12–28)
BUN: 20 mg/dL (ref 8–27)
Bilirubin Total: 0.3 mg/dL (ref 0.0–1.2)
CO2: 16 mmol/L — ABNORMAL LOW (ref 20–29)
Calcium: 9.8 mg/dL (ref 8.7–10.3)
Chloride: 105 mmol/L (ref 96–106)
Creatinine, Ser: 1.12 mg/dL — ABNORMAL HIGH (ref 0.57–1.00)
Globulin, Total: 2.3 g/dL (ref 1.5–4.5)
Glucose: 95 mg/dL (ref 65–99)
Potassium: 3.9 mmol/L (ref 3.5–5.2)
Sodium: 140 mmol/L (ref 134–144)
Total Protein: 7.1 g/dL (ref 6.0–8.5)
eGFR: 48 mL/min/{1.73_m2} — ABNORMAL LOW (ref >59)

## 2021-02-21 LAB — CBC
Hematocrit: 40.6 % (ref 34.0–46.6)
Hemoglobin: 13.4 g/dL (ref 11.1–15.9)
MCH: 28.8 pg (ref 26.6–33.0)
MCHC: 33 g/dL (ref 31.5–35.7)
MCV: 87 fL (ref 79–97)
Platelets: 444 10*3/uL (ref 150–450)
RBC: 4.66 x10E6/uL (ref 3.77–5.28)
RDW: 14.2 % (ref 11.7–15.4)
WBC: 7.7 10*3/uL (ref 3.4–10.8)

## 2021-02-21 LAB — HEMOGLOBIN A1C
Est. average glucose Bld gHb Est-mCnc: 131 mg/dL
Hgb A1c MFr Bld: 6.2 % — ABNORMAL HIGH (ref 4.8–5.6)

## 2021-02-27 ENCOUNTER — Other Ambulatory Visit (HOSPITAL_COMMUNITY): Payer: Self-pay

## 2021-02-27 ENCOUNTER — Other Ambulatory Visit: Payer: Self-pay | Admitting: Internal Medicine

## 2021-02-27 MED ORDER — ONETOUCH VERIO VI STRP
ORAL_STRIP | 11 refills | Status: DC
  Filled 2021-02-27: qty 50, 50d supply, fill #0

## 2021-02-28 ENCOUNTER — Ambulatory Visit (INDEPENDENT_AMBULATORY_CARE_PROVIDER_SITE_OTHER): Payer: Medicare (Managed Care)

## 2021-02-28 ENCOUNTER — Other Ambulatory Visit (HOSPITAL_COMMUNITY): Payer: Self-pay

## 2021-02-28 DIAGNOSIS — K529 Noninfective gastroenteritis and colitis, unspecified: Secondary | ICD-10-CM

## 2021-02-28 DIAGNOSIS — E1122 Type 2 diabetes mellitus with diabetic chronic kidney disease: Secondary | ICD-10-CM

## 2021-02-28 DIAGNOSIS — I129 Hypertensive chronic kidney disease with stage 1 through stage 4 chronic kidney disease, or unspecified chronic kidney disease: Secondary | ICD-10-CM | POA: Diagnosis not present

## 2021-02-28 DIAGNOSIS — N1831 Chronic kidney disease, stage 3a: Secondary | ICD-10-CM | POA: Diagnosis not present

## 2021-02-28 NOTE — Chronic Care Management (AMB) (Signed)
Chronic Care Management    Social Work Note  02/28/2021 Name: Anna Hoffman MRN: [id number] DOB: 1935/04/09  Anna Hoffman is a 86 y.o. year old female who is a primary care patient of Glendale Chard, MD. The CCM team was consulted to assist the patient with chronic disease management and/or care coordination needs related to:  HTN, DM II, and Colitis .   Engaged with patient by telephone for follow up visit in response to provider referral for social work chronic care management and care coordination services.   Consent to Services:  The patient was given information about Chronic Care Management services, agreed to services, and gave verbal consent prior to initiation of services.  Please see initial visit note for detailed documentation.   Patient agreed to services and consent obtained.   Assessment: Review of patient past medical history, allergies, medications, and health status, including review of relevant consultants reports was performed today as part of a comprehensive evaluation and provision of chronic care management and care coordination services.     SDOH (Social Determinants of Health) assessments and interventions performed:    Advanced Directives Status: Not addressed in this encounter.  CCM Care Plan  Allergies  Allergen Reactions   Amoxicillin Diarrhea    Has patient had a PCN reaction causing immediate rash, facial/tongue/throat swelling, SOB or lightheadedness with hypotension: no Has patient had a PCN reaction causing severe rash involving mucus membranes or skin necrosis: no Has patient had a PCN reaction that required hospitalization: no pharmacist consult Has patient had a PCN reaction occurring within the last 10 years: yes If all of the above answers are "NO", then may proceed with Cephalosporin use.    Ampicillin Rash    - Tolerates Rocephin and Ancef - Remote occurrence; no symptoms of anaphylaxis or severe cutaneous reaction, and no additional  medical attention required     Sulfa Antibiotics Rash    Outpatient Encounter Medications as of 02/28/2021  Medication Sig   acetaminophen (TYLENOL) 325 MG tablet Take 2 tablets (650 mg total) by mouth every 6 (six) hours as needed for mild pain (or Fever >/= 101).   amLODipine (NORVASC) 10 MG tablet TAKE 1 TABLET BY MOUTH ONCE DAILY (Patient taking differently: Take 10 mg by mouth daily.)   Ascorbic Acid (VITAMIN C) 1000 MG tablet Take 1,000 mg by mouth daily.   azelastine (ASTELIN) 0.1 % nasal spray Place 2 sprays into both nostrils daily as needed for rhinitis.   Carboxymethylcellul-Glycerin (LUBRICATING EYE DROPS OP) Apply 1 drop to eye daily as needed (dry eyes).    Fexofenadine HCl (ALLEGRA PO) Take 180 mg by mouth at bedtime.    glucose blood (ONETOUCH VERIO) test strip Use as directed to check blood sugars 1 time per day   Lancets (ONETOUCH ULTRASOFT) lancets Use as instructed to check blood sugar 1 times per day dx code e11.65   pantoprazole (PROTONIX) 40 MG tablet TAKE 1 TABLET BY MOUTH TWICE A DAY BEFORE MEALS   rosuvastatin (CRESTOR) 20 MG tablet TAKE 1 TABLET BY MOUTH ONCE DAILY   sucralfate (CARAFATE) 1 GM/10ML suspension TAKE 10 MLS (1 G TOTAL) BY MOUTH 4 (FOUR) TIMES DAILY - WITH MEALS AND AT BEDTIME FOR 28 DAYS.   trolamine salicylate (ASPERCREME) 10 % cream Apply 1 application topically daily as needed for muscle pain.   vitamin B-12 (CYANOCOBALAMIN) 100 MCG tablet Take 100 mcg by mouth daily.   No facility-administered encounter medications on file as of 02/28/2021.  Patient Active Problem List   Diagnosis Date Noted   CAP (community acquired pneumonia) 11/15/2020   Dehydration 11/15/2020   Erosive esophagitis    Hiatal hernia    Hyponatremia 10/10/2020   Acute kidney injury superimposed on chronic kidney disease ([place]) 10/10/2020   Aortic atherosclerosis ([place]) 10/09/2020   Acute renal failure superimposed on stage 3a chronic kidney disease ([place]) 10/09/2020    Hyperkalemia 10/09/2020   Hearing loss 10/09/2020   Prolonged QT interval 10/09/2020   Colitis, acute 08/11/2019   Colitis 07/11/2019   Loose stools 06/07/2019   Herpes zoster without complication 16/02/3709   Other long term (current) drug therapy 08/12/2018   Chronic renal disease, stage III ([place]) 07/19/2018   Hypertensive nephropathy 07/19/2018   Community acquired pneumonia of right upper lobe of lung 11/07/2017   Hypertensive emergency 07/01/2016   Thalamic hemorrhage ([place]) 07/01/2016   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[place])    Benign essential HTN    Type 2 diabetes mellitus with stage 3 chronic kidney disease, without long-term current use of insulin ([place])    Mixed hyperlipidemia    ICH (intracerebral hemorrhage) ([place]) - hypertensive R thalamic hemorrhage  06/27/2016   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   Retroperitoneal bleed 02/08/2016   Generalized abdominal pain    Gastroenteritis 02/04/2016   Diarrhea 02/04/2016   Hypokalemia 02/04/2016   Incarcerated paraesophageal hernia 09/02/2014   Renal mass, left 09/02/2014   Acute esophagitis 09/02/2014   Gastric outlet obstruction 09/02/2014   Hypertension    Vomiting 09/01/2014    Conditions to be addressed/monitored: HTN, DMII, and Colitis  Care Plan : Social Work Care Plan  Updates made by Daneen Schick since 02/28/2021 12:00 AM     Problem: Barriers to Treatment - Transportation      Goal: Barriers to Treatment Identified and Managed   Start Date: 01/11/2021  Expected End Date: 02/10/2021  Recent Progress: On track  Priority: Medium  Note:   Current Barriers:  Chronic disease management support and education needs related to HTN, DM, and Colitis   Transportation  Social Worker Clinical Goal(s):  patient will work with SW to identify and address any acute and/or chronic care coordination needs related to the self health management of HTN, DM, and Colitis   patient will work with SW to address concerns  related to transportation for upcomming primary care appointment  SW Interventions:  Inter-disciplinary care team collaboration (see longitudinal plan of care) Collaboration with Glendale Chard, MD regarding development and update of comprehensive plan of care as evidenced by provider attestation and co-signature Successful outbound call placed to the patient to assess for care coordination needs - no acute challenges noted at this time  Performed chart review to note patient recently seen in clinic and diagnosed with an upper respiratory infection Determined the patient is feeling better after taking antibiotics Encouraged the patient to contact care management team as needed  Scheduled follow up call over the next month Patient Goals/Self-Care Activities patient will:   -  Contact primary provider as needed -Contact SW prior to next scheduled appointment as needed   Follow Up Plan:  SW will follow up with the patient over the next 60 days       Follow Up Plan: SW will follow up with patient by phone over the next 60 days      Daneen Schick, BSW, CDP Social Worker, Certified Dementia Practitioner [place] [facility] [phone]

## 2021-02-28 NOTE — Patient Instructions (Signed)
Social Worker Visit Information  Goals we discussed today:   Goals Addressed             This Visit's Progress    Barriers to Treatment Identified and Managed       Timeframe:  Short-Term Goal Priority:  Medium Start Date:  6.10.22                           Expected End Date:  7.10.22                     Next planned outreach: 9.28.22  Patient Goals/Self-Care Activities  patient will:   -  Contact primary provider as needed -Contact SW prior to next scheduled appointment as needed         Follow Up Plan: SW will follow up with patient by phone over the next 60 days   Daneen Schick, BSW, CDP Social Worker, Certified Dementia Practitioner [place] [facility] [phone]

## 2021-03-01 ENCOUNTER — Other Ambulatory Visit (HOSPITAL_COMMUNITY): Payer: Self-pay

## 2021-03-01 ENCOUNTER — Other Ambulatory Visit: Payer: Self-pay

## 2021-03-01 MED ORDER — GLUCOSE BLOOD VI STRP
ORAL_STRIP | 11 refills | Status: AC
  Filled 2021-03-01 – 2021-08-08 (×2): qty 100, 100d supply, fill #0

## 2021-03-01 MED ORDER — FREESTYLE SYSTEM KIT: PACK | 1 refills | Status: AC

## 2021-03-01 MED ORDER — FREESTYLE LANCETS MISC
12 refills | Status: DC
Start: 2021-03-01 — End: 2021-03-01
  Filled 2021-03-01: qty 100, 90d supply, fill #0

## 2021-03-01 MED ORDER — ONETOUCH DELICA PLUS LANCET33G MISC
12 refills | Status: AC
  Filled 2021-03-01 – 2021-08-19 (×2): qty 100, 90d supply, fill #0

## 2021-03-05 ENCOUNTER — Telehealth: Payer: Medicare Other

## 2021-03-07 ENCOUNTER — Ambulatory Visit: Payer: Medicare Other | Admitting: Internal Medicine

## 2021-03-07 DIAGNOSIS — M170 Bilateral primary osteoarthritis of knee: Secondary | ICD-10-CM | POA: Diagnosis not present

## 2021-03-11 ENCOUNTER — Ambulatory Visit (INDEPENDENT_AMBULATORY_CARE_PROVIDER_SITE_OTHER): Payer: Medicare Other

## 2021-03-11 ENCOUNTER — Telehealth: Payer: Self-pay | Admitting: Physician Assistant

## 2021-03-11 ENCOUNTER — Telehealth: Payer: Medicare Other

## 2021-03-11 ENCOUNTER — Other Ambulatory Visit: Payer: Self-pay

## 2021-03-11 ENCOUNTER — Other Ambulatory Visit: Payer: Medicare Other

## 2021-03-11 DIAGNOSIS — N1831 Chronic kidney disease, stage 3a: Secondary | ICD-10-CM

## 2021-03-11 DIAGNOSIS — E1122 Type 2 diabetes mellitus with diabetic chronic kidney disease: Secondary | ICD-10-CM | POA: Diagnosis not present

## 2021-03-11 DIAGNOSIS — Z20822 Contact with and (suspected) exposure to covid-19: Secondary | ICD-10-CM

## 2021-03-11 DIAGNOSIS — I129 Hypertensive chronic kidney disease with stage 1 through stage 4 chronic kidney disease, or unspecified chronic kidney disease: Secondary | ICD-10-CM

## 2021-03-11 DIAGNOSIS — N183 Chronic kidney disease, stage 3 unspecified: Secondary | ICD-10-CM | POA: Diagnosis not present

## 2021-03-11 DIAGNOSIS — I5189 Other ill-defined heart diseases: Secondary | ICD-10-CM

## 2021-03-11 NOTE — Progress Notes (Signed)
This encounter was created in error - please disregard.

## 2021-03-11 NOTE — Telephone Encounter (Signed)
Spoke with the patient. She is experiencing an "upset stomach" with vomiting and diarrhea. Complains of overwhelming fatigue and no appetite.  Her symptoms started yesterday. She is afebrile. She took some Carafate and that has helped. She had apparently not been taking daily Protonix, but resumed this last night. Today she will be going to her PCP for a COVID test. She may have been exposed. She has tolerated ginger ale and toast this morning. She will keep [place] posted.

## 2021-03-11 NOTE — Progress Notes (Signed)
lab

## 2021-03-11 NOTE — Telephone Encounter (Signed)
Inbound call from patient requesting a call back. She is having trouble with her stomach and only wants to talk to [place]. Best contact [phone]

## 2021-03-12 ENCOUNTER — Other Ambulatory Visit: Payer: Self-pay

## 2021-03-12 LAB — NOVEL CORONAVIRUS, NAA: SARS-CoV-2, NAA: NOT DETECTED

## 2021-03-12 LAB — SARS-COV-2, NAA 2 DAY TAT

## 2021-03-15 NOTE — Chronic Care Management (AMB) (Signed)
Chronic Care Management   CCM RN Visit Note  03/11/2021 Name: Anna Hoffman MRN: [id number] DOB: October 16, 1934  Subjective: Anna Hoffman is a 86 y.o. year old female who is a primary care patient of Glendale Chard, MD. The care management team was consulted for assistance with disease management and care coordination needs.    Engaged with patient by telephone for follow up visit in response to provider referral for case management and/or care coordination services.   Consent to Services:  The patient was given information about Chronic Care Management services, agreed to services, and gave verbal consent prior to initiation of services.  Please see initial visit note for detailed documentation.   Patient agreed to services and verbal consent obtained.   Assessment: Review of patient past medical history, allergies, medications, health status, including review of consultants reports, laboratory and other test data, was performed as part of comprehensive evaluation and provision of chronic care management services.   SDOH (Social Determinants of Health) assessments and interventions performed: Yes, no acute challenges    CCM Care Plan  Allergies  Allergen Reactions   Amoxicillin Diarrhea    Has patient had a PCN reaction causing immediate rash, facial/tongue/throat swelling, SOB or lightheadedness with hypotension: no Has patient had a PCN reaction causing severe rash involving mucus membranes or skin necrosis: no Has patient had a PCN reaction that required hospitalization: no pharmacist consult Has patient had a PCN reaction occurring within the last 10 years: yes If all of the above answers are "NO", then may proceed with Cephalosporin use.    Ampicillin Rash    - Tolerates Rocephin and Ancef - Remote occurrence; no symptoms of anaphylaxis or severe cutaneous reaction, and no additional medical attention required     Sulfa Antibiotics Rash    Outpatient Encounter Medications  as of 03/11/2021  Medication Sig   acetaminophen (TYLENOL) 325 MG tablet Take 2 tablets (650 mg total) by mouth every 6 (six) hours as needed for mild pain (or Fever >/= 101).   amLODipine (NORVASC) 10 MG tablet TAKE 1 TABLET BY MOUTH ONCE DAILY (Patient taking differently: Take 10 mg by mouth daily.)   Ascorbic Acid (VITAMIN C) 1000 MG tablet Take 1,000 mg by mouth daily.   azelastine (ASTELIN) 0.1 % nasal spray Place 2 sprays into both nostrils daily as needed for rhinitis.   Carboxymethylcellul-Glycerin (LUBRICATING EYE DROPS OP) Apply 1 drop to eye daily as needed (dry eyes).    Fexofenadine HCl (ALLEGRA PO) Take 180 mg by mouth at bedtime.    glucose blood test strip Use as directed to check blood sugars 1 time per day.   glucose monitoring kit (FREESTYLE) monitoring kit Use as directed to check blood sugars 1 time per day dx: e11.22   Lancets (ONETOUCH DELICA PLUS [id number]) MISC Use to check blood sugar once daily   pantoprazole (PROTONIX) 40 MG tablet TAKE 1 TABLET BY MOUTH TWICE A DAY BEFORE MEALS   rosuvastatin (CRESTOR) 20 MG tablet TAKE 1 TABLET BY MOUTH ONCE DAILY   sucralfate (CARAFATE) 1 GM/10ML suspension TAKE 10 MLS (1 G TOTAL) BY MOUTH 4 (FOUR) TIMES DAILY - WITH MEALS AND AT BEDTIME FOR 28 DAYS.   trolamine salicylate (ASPERCREME) 10 % cream Apply 1 application topically daily as needed for muscle pain.   vitamin B-12 (CYANOCOBALAMIN) 100 MCG tablet Take 100 mcg by mouth daily.   No facility-administered encounter medications on file as of 03/11/2021.    Patient Active Problem List  Diagnosis Date Noted   CAP (community acquired pneumonia) 11/15/2020   Dehydration 11/15/2020   Erosive esophagitis    Hiatal hernia    Hyponatremia 10/10/2020   Acute kidney injury superimposed on chronic kidney disease ([place]) 10/10/2020   Aortic atherosclerosis ([place]) 10/09/2020   Acute renal failure superimposed on stage 3a chronic kidney disease ([place]) 10/09/2020   Hyperkalemia 10/09/2020    Hearing loss 10/09/2020   Prolonged QT interval 10/09/2020   Colitis, acute 08/11/2019   Colitis 07/11/2019   Loose stools 06/07/2019   Herpes zoster without complication 37/48/2707   Other long term (current) drug therapy 08/12/2018   Chronic renal disease, stage III ([place]) 07/19/2018   Hypertensive nephropathy 07/19/2018   Community acquired pneumonia of right upper lobe of lung 11/07/2017   Hypertensive emergency 07/01/2016   Thalamic hemorrhage ([place]) 07/01/2016   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[place])    Benign essential HTN    Type 2 diabetes mellitus with stage 3 chronic kidney disease, without long-term current use of insulin ([place])    Mixed hyperlipidemia    ICH (intracerebral hemorrhage) ([place]) - hypertensive R thalamic hemorrhage  06/27/2016   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   Retroperitoneal bleed 02/08/2016   Generalized abdominal pain    Gastroenteritis 02/04/2016   Diarrhea 02/04/2016   Hypokalemia 02/04/2016   Incarcerated paraesophageal hernia 09/02/2014   Renal mass, left 09/02/2014   Acute esophagitis 09/02/2014   Gastric outlet obstruction 09/02/2014   Hypertension    Vomiting 09/01/2014    Conditions to be addressed/monitored: Type 2 DM with Stage III CKD, HTN, Hypertensive Nephropathy, Diastolic Dysfunction  Care Plan : Vestibular Balance Disorder  Updates made by Lynne Logan, RN since 03/11/2021 12:00 AM     Problem: Vestibular Balance Disorder   Priority: High     Long-Range Goal: Evaluation and treatment of Vertigo related to Vestibular Movement Disorder   Start Date: 09/05/2020  Expected End Date: 09/05/2021  Recent Progress: On track  Priority: High  Note:   CARE PLAN ENTRY (see longitudinal plan of care for additional care plan information)  Current Barriers:  Knowledge Deficits related to evaluation and treatment of Vertigo Chronic Disease Management support and education needs related to Type 2 DM with Stage III CKD, HTN,  Hypertensive Nephropathy, Diastolic Dysfunction Nurse Case Manager Clinical Goal(s):  Patient will work with the CCM team to address needs related to evaluation and treatment of Vertigo CCM RN CM Interventions:  03/11/21 completed successful outbound call with patient  Inter-disciplinary care team collaboration (see longitudinal plan of care) Evaluation of current treatment plan related to Vertigo and patient's adherence to plan as established by provider. Reiterated the importance of staying well hydrated and taking her time with position change to allow for BP adjustment with position changes Determined patient is under the care of Dr. Benjamine Mola for Vestibular management and feels this condition has improved  Discussed next PCP OV f/u scheduled for 06/26/21 '@2' :30 PM  Discussed plans with patient for ongoing care management follow up and provided patient with direct contact information for care management team Patient Goals/Self Care Activities:  Continue to work with Dr. Benjamine Mola for management of Vertigo Change positions slowly to help avoid loss of balance related to Vertigo Attend all scheduled provider appointments Call pharmacy for medication refills Attend church or other social activities Call provider office for new concerns or questions  Next Scheduled Follow Up Call: 07/01/21    Care Plan : Chronic Kidney (Adult)  Updates made by Barb Merino  L, RN since 03/11/2021 12:00 AM     Problem: Disease Progression   Priority: High     Long-Range Goal: Disease Progression Prevented or Minimized   Start Date: 10/19/2020  Expected End Date: 10/19/2021  Recent Progress: On track  Priority: High  Note:   Current Barriers:  Ineffective Self Health Maintenance Clinical Goal(s):  Collaboration with Glendale Chard, MD regarding development and update of comprehensive plan of care as evidenced by provider attestation and co-signature Inter-disciplinary care team collaboration (see longitudinal  plan of care) patient will work with care management team to address care coordination and chronic disease management needs related to Disease Management Educational Needs Care Coordination Medication Management and Education Psychosocial Support   Interventions:  03/11/21 completed successful outbound call with patient  Evaluation of current treatment plan related to CKD Stage III , self-management and patient's adherence to plan as established by provider. Collaboration with Glendale Chard, MD regarding development and update of comprehensive plan of care as evidenced by provider attestation       and co-signature Inter-disciplinary care team collaboration (see longitudinal plan of care) Provided education to patient about basic CKD disease process Review of patient status, including review of consultants reports, relevant laboratory and other test results, and medications completed. Reviewed medications with patient and discussed importance of medication adherence Increase water intake to 64 oz daily unless otherwise directed  Reviewed scheduled/upcoming provider appointments including: PCP follow up scheduled for 06/26/21 '@2' :30 PM  Discussed plans with patient for ongoing care management follow up and provided patient with direct contact information for care management team Self Care Activities:  Increase water intake to 64 oz daily Keep all scheduled MD follow up appointments  Continue to take prescribed medications as directed Let your CCM team and or PCP know if you are unable to take your medications as prescribed Contact pharmacy for refills at least 7 days before running out of medication Contact PCP for questions or concerns  Patient Goals: - to maintain and or improve kidney function  Follow Up Plan: Telephone follow up appointment with care management team member scheduled for: 07/01/21     Care Plan : Diabetes Type 2 (Adult)  Updates made by Lynne Logan, RN since  03/11/2021 12:00 AM     Problem: Glycemic Management (Diabetes, Type 2)   Priority: Medium     Long-Range Goal: Glycemic Management Optimized   Start Date: 10/19/2020  Expected End Date: 10/19/2021  Recent Progress: On track  Priority: Medium  Note:   Objective:  Lab Results  Component Value Date   HGBA1C 6.2 (H) 02/20/2021   Lab Results  Component Value Date   CREATININE 1.12 (H) 02/20/2021   CREATININE 0.95 11/23/2020   CREATININE 0.92 11/17/2020   Lab Results  Component Value Date   EGFR 48 (L) 02/20/2021   Current Barriers:  Knowledge Deficits related to basic Diabetes pathophysiology and self care/management Knowledge Deficits related to medications used for management of diabetes Case Manager Clinical Goal(s):  patient will demonstrate improved adherence to prescribed treatment plan for diabetes self care/management as evidenced by: daily monitoring and recording of CBG  adherence to ADA/ carb modified diet adherence to prescribed medication regimen contacting provider for new or worsened symptoms or questions Interventions:  03/11/21 completed successful outbound call with patient  Collaboration with Glendale Chard, MD regarding development and update of comprehensive plan of care as evidenced by provider attestation and co-signature Inter-disciplinary care team collaboration (see longitudinal plan of care) Provided education to  patient about basic DM disease process Review of patient status, including review of consultants reports, relevant laboratory and other test results, and medications completed. Reviewed medications with patient and discussed importance of medication adherence Provided patient with written educational materials related to hypo and hyperglycemia and importance of correct treatment Reviewed scheduled/upcoming provider appointments including: PCP follow up scheduled for 06/26/21 '@2' :30 PM   Advised patient, providing education and rationale, to check  cbg daily before meals and at bedtime and record, calling the CCM team and or PCP for findings outside established parameters.  Mailed printed educational materials related to Diabetes Management   Discussed plans with patient for ongoing care management follow up and provided patient with direct contact information for care management team Self-Care Activities Self administers oral medications as prescribed Attends all scheduled provider appointments Checks blood sugars as prescribed and utilize hyper and hypoglycemia protocol as needed Adheres to prescribed ADA/carb modified Patient Goals: - check blood sugar at prescribed times - check blood sugar before and after exercise - check blood sugar if I feel it is too high or too low - take the blood sugar log to all doctor visits - take the blood sugar meter to all doctor visits  Follow Up Plan: Telephone follow up appointment with care management team member scheduled for: 07/01/21    Plan:Telephone follow up appointment with care management team member scheduled for:  07/01/21  Barb Merino, RN, BSN, CCM Care Management Coordinator [facility]  Direct Phone: [phone]

## 2021-03-15 NOTE — Patient Instructions (Signed)
Goals Addressed      Follow My Treatment Plan-Chronic Kidney   On track    Timeframe:  Long-Range Goal Priority:  High Start Date:  10/19/20                           Expected End Date: 10/19/21                    Follow Up Date: 07/01/21   Patient Self-Care Activities: Increase water intake to 64 oz daily Keep all scheduled MD follow up appointments  Continue to take prescribed medications as directed Let your CCM team and or PCP know if you are unable to take your medications as prescribed Contact pharmacy for refills at least 7 days before running out of medication Contact PCP for questions or concerns    Why is this important?   Staying as healthy as you can is very important. This may mean making changes if you smoke, don't exercise or eat poorly.  A healthy lifestyle is an important goal for you.  Following the treatment plan and making changes may be hard.  Try some of these steps to help keep the disease from getting worse.     Notes:      Monitor and Manage My Blood Sugar-Diabetes Type 2   On track    Timeframe:  Long-Range Goal Priority:  Medium Start Date: 10/19/20                            Expected End Date: 10/19/21                     Follow Up Date: 07/01/21   - check blood sugar at prescribed times - check blood sugar before and after exercise - check blood sugar if I feel it is too high or too low - take the blood sugar log to all doctor visits - take the blood sugar meter to all doctor visits    Why is this important?   Checking your blood sugar at home helps to keep it from getting very high or very low.  Writing the results in a diary or log helps the doctor know how to care for you.  Your blood sugar log should have the time, date and the results.  Also, write down the amount of insulin or other medicine that you take.  Other information, like what you ate, exercise done and how you were feeling, will also be helpful.     Notes:      Obtain Eye  Exam-Diabetes Type 2       Timeframe:  Long-Range Goal Priority:  Medium Start Date: 10/19/20                            Expected End Date: 10/19/21                        Follow Up Date: 07/01/21   - keep appointment with eye doctor - schedule appointment with eye doctor    Why is this important?   Eye check-ups are important when you have diabetes.  Vision loss can be prevented.    Notes:      Perform Foot Care-Diabetes Type 2       Timeframe:  Long-Range Goal Priority:  Medium Start Date:  10/19/20                           Expected End Date:  10/19/21                    Follow Up Date: 07/01/21   - check feet daily for cuts, sores or redness - keep feet up while sitting - trim toenails straight across - wash and dry feet carefully every day - wear comfortable, cotton socks - wear comfortable, well-fitting shoes    Why is this important?   Good foot care is very important when you have diabetes.  There are many things you can do to keep your feet healthy and catch a problem early.    Notes:      Vertigo Managed   On track    Timeframe:  Long-Range Goal Priority:  High Start Date:  09/05/20                           Expected End Date: 09/25/20  Next Follow Up date: 07/01/21  Continue to work with Dr. Benjamine Mola for management of Vertigo Change positions slowly to help avoid loss of balance related to Vertigo Attend all scheduled provider appointments Call pharmacy for medication refills Attend church or other social activities Call provider office for new concerns or questions

## 2021-03-21 ENCOUNTER — Ambulatory Visit (INDEPENDENT_AMBULATORY_CARE_PROVIDER_SITE_OTHER): Payer: Medicare Other | Admitting: Nurse Practitioner

## 2021-03-21 ENCOUNTER — Encounter: Payer: Self-pay | Admitting: Nurse Practitioner

## 2021-03-21 ENCOUNTER — Other Ambulatory Visit: Payer: Self-pay

## 2021-03-21 VITALS — BP 122/70 | HR 84 | Temp 98.5°F | Ht 65.5 in | Wt 133.4 lb

## 2021-03-21 DIAGNOSIS — H9202 Otalgia, left ear: Secondary | ICD-10-CM

## 2021-03-21 DIAGNOSIS — T162XXA Foreign body in left ear, initial encounter: Secondary | ICD-10-CM | POA: Diagnosis not present

## 2021-03-21 NOTE — Progress Notes (Signed)
I,Yamilka Roman [employer] as a Education administrator for [employer], NP.,have documented all relevant documentation on the behalf of [employer], NP,as directed by  Bary Castilla, NP while in the presence of Bary Castilla, NP.  This visit occurred during the SARS-CoV-2 public health emergency.  Safety protocols were in place, including screening questions prior to the visit, additional usage of staff PPE, and extensive cleaning of exam room while observing appropriate contact time as indicated for disinfecting solutions.  Subjective:     Patient ID: Anna Hoffman , female    DOB: 03/07/1935 , 86 y.o.   MRN: [id number]   Chief Complaint  Patient presents with   Ear Pain    HPI  The patient is here stating she has something in her left ear. She went to get her hearing test done yesterday and they told her that she had something in her ear. She also states she has pain associated on that side as well.   Otalgia  There is pain in the left ear. This is a new problem. The current episode started in the past 7 days. The problem has been unchanged. There has been no fever. Associated symptoms include hearing loss. Pertinent negatives include no coughing, diarrhea, headaches or sore throat. She has tried ear drops (she has been using sweet oil) for the symptoms. The treatment provided no relief.    Past Medical History:  Diagnosis Date   Arthritis    Diabetes mellitus without complication ([place])    Hypertension    Hypokalemia    Pneumonia    Shingles    Stroke [facility])      Family History  Problem Relation Age of Onset   Alzheimer's disease Mother    Prostate cancer Father    Brain cancer Brother    Brain cancer Brother    Pancreatic cancer Sister    Allergic rhinitis Neg Hx    Asthma Neg Hx    Eczema Neg Hx    Urticaria Neg Hx      Current Outpatient Medications:    acetaminophen (TYLENOL) 325 MG tablet, Take 2 tablets (650 mg total) by mouth every 6 (six) hours as needed  for mild pain (or Fever >/= 101)., Disp: , Rfl:    amLODipine (NORVASC) 10 MG tablet, TAKE 1 TABLET BY MOUTH ONCE DAILY (Patient taking differently: Take 10 mg by mouth daily.), Disp: 90 tablet, Rfl: 1   Ascorbic Acid (VITAMIN C) 1000 MG tablet, Take 1,000 mg by mouth daily., Disp: , Rfl:    azelastine (ASTELIN) 0.1 % nasal spray, Place 2 sprays into both nostrils daily as needed for rhinitis., Disp: , Rfl:    Carboxymethylcellul-Glycerin (LUBRICATING EYE DROPS OP), Apply 1 drop to eye daily as needed (dry eyes). , Disp: , Rfl:    Fexofenadine HCl (ALLEGRA PO), Take 180 mg by mouth at bedtime. , Disp: , Rfl:    glucose blood test strip, Use as directed to check blood sugars 1 time per day., Disp: 100 each, Rfl: 11   glucose monitoring kit (FREESTYLE) monitoring kit, Use as directed to check blood sugars 1 time per day dx: e11.22, Disp: 1 each, Rfl: 1   Lancets (ONETOUCH DELICA PLUS [id number]) MISC, Use to check blood sugar once daily, Disp: 100 each, Rfl: 12   pantoprazole (PROTONIX) 40 MG tablet, TAKE 1 TABLET BY MOUTH TWICE A DAY BEFORE MEALS, Disp: 180 tablet, Rfl: 1   rosuvastatin (CRESTOR) 20 MG tablet, TAKE 1 TABLET BY MOUTH ONCE DAILY, Disp: 90  tablet, Rfl: 1   sucralfate (CARAFATE) 1 GM/10ML suspension, TAKE 10 MLS (1 G TOTAL) BY MOUTH 4 (FOUR) TIMES DAILY - WITH MEALS AND AT BEDTIME FOR 28 DAYS., Disp: 840 mL, Rfl: 1   trolamine salicylate (ASPERCREME) 10 % cream, Apply 1 application topically daily as needed for muscle pain., Disp: , Rfl:    vitamin B-12 (CYANOCOBALAMIN) 100 MCG tablet, Take 100 mcg by mouth daily., Disp: , Rfl:    Allergies  Allergen Reactions   Amoxicillin Diarrhea    Has patient had a PCN reaction causing immediate rash, facial/tongue/throat swelling, SOB or lightheadedness with hypotension: no Has patient had a PCN reaction causing severe rash involving mucus membranes or skin necrosis: no Has patient had a PCN reaction that required hospitalization: no pharmacist  consult Has patient had a PCN reaction occurring within the last 10 years: yes If all of the above answers are "NO", then may proceed with Cephalosporin use.    Ampicillin Rash    - Tolerates Rocephin and Ancef - Remote occurrence; no symptoms of anaphylaxis or severe cutaneous reaction, and no additional medical attention required     Sulfa Antibiotics Rash     Review of Systems  Constitutional:  Negative for chills and fever.  HENT:  Positive for ear pain and hearing loss. Negative for sore throat.   Respiratory:  Negative for cough and wheezing.   Cardiovascular:  Negative for chest pain and palpitations.  Gastrointestinal:  Negative for constipation and diarrhea.  Musculoskeletal:  Negative for arthralgias.  Neurological:  Negative for headaches.    Today's Vitals   03/21/21 1527  BP: 122/70  Pulse: 84  Temp: 98.5 F (36.9 C)  Weight: 133 lb 6.4 oz (60.5 kg)  Height: 5' 5.5" (1.664 m)  PainSc: 0-No pain   Body mass index is 21.86 kg/m.   Objective:  Physical Exam Constitutional:      Appearance: Normal appearance. She is obese.  HENT:     Head: Normocephalic and atraumatic.     Right Ear: Tympanic membrane, ear canal and external ear normal. No drainage. There is no impacted cerumen.     Left Ear: Tympanic membrane and external ear normal. No drainage. There is no impacted cerumen. A foreign body is present.     Ears:     Comments: Left ear impacted with left over cotton from qtip  Cardiovascular:     Rate and Rhythm: Normal rate and regular rhythm.     Pulses: Normal pulses.     Heart sounds: Normal heart sounds. No murmur heard. Pulmonary:     Effort: Pulmonary effort is normal. No respiratory distress.     Breath sounds: Normal breath sounds. No wheezing.  Skin:    General: Skin is warm and dry.     Capillary Refill: Capillary refill takes less than 2 seconds.  Neurological:     Mental Status: She is alert.        Assessment And Plan:     1. Foreign  body of left ear, initial encounter -Patient had a piece of cotton ball stuck in her ear from her cleaning it out herself  -Curette was used to pull it out along with flush.   2. Ear pain, left  -No drainiage.  -The ear pain was most likely contributed from the qtip impaction.  -Advised patient if she continues to have the ear pain she will call us. -will consider ENT referral if needed.   The patient was encouraged to call or  send a message through [place] for any questions or concerns.   Follow up: if symptoms persist or do not get better.   Patient was given opportunity to ask questions. Patient verbalized understanding of the plan and was able to repeat key elements of the plan. All questions were answered to their satisfaction.  Raman Hinley Brimage, DNP   I, Raman Suzanne Kho have reviewed all documentation for this visit. The documentation on 03/21/21 for the exam, diagnosis, procedures, and orders are all accurate and complete.    IF YOU HAVE BEEN REFERRED TO A SPECIALIST, IT MAY TAKE 1-2 WEEKS TO SCHEDULE/PROCESS THE REFERRAL. IF YOU HAVE NOT HEARD FROM US/SPECIALIST IN TWO WEEKS, PLEASE GIVE [place] A CALL AT [phone].   THE PATIENT IS ENCOURAGED TO PRACTICE SOCIAL DISTANCING DUE TO THE COVID-19 PANDEMIC.

## 2021-03-31 ENCOUNTER — Inpatient Hospital Stay (HOSPITAL_COMMUNITY)
Admission: EM | Admit: 2021-03-31 | Discharge: 2021-04-09 | DRG: 315 | Disposition: A | Discharge status: Skilled Nursing Facility | Payer: Medicare Other | Attending: Internal Medicine | Admitting: Internal Medicine

## 2021-03-31 ENCOUNTER — Other Ambulatory Visit: Payer: Self-pay

## 2021-03-31 ENCOUNTER — Encounter (HOSPITAL_COMMUNITY): Payer: Self-pay

## 2021-03-31 ENCOUNTER — Emergency Department (HOSPITAL_COMMUNITY): Payer: Medicare Other

## 2021-03-31 DIAGNOSIS — S0993XA Unspecified injury of face, initial encounter: Secondary | ICD-10-CM | POA: Diagnosis not present

## 2021-03-31 DIAGNOSIS — Z880 Allergy status to penicillin: Secondary | ICD-10-CM | POA: Diagnosis not present

## 2021-03-31 DIAGNOSIS — S01511A Laceration without foreign body of lip, initial encounter: Secondary | ICD-10-CM | POA: Diagnosis not present

## 2021-03-31 DIAGNOSIS — R2689 Other abnormalities of gait and mobility: Secondary | ICD-10-CM | POA: Diagnosis not present

## 2021-03-31 DIAGNOSIS — W19XXXD Unspecified fall, subsequent encounter: Secondary | ICD-10-CM | POA: Diagnosis not present

## 2021-03-31 DIAGNOSIS — Z90710 Acquired absence of both cervix and uterus: Secondary | ICD-10-CM | POA: Diagnosis not present

## 2021-03-31 DIAGNOSIS — Y92009 Unspecified place in unspecified non-institutional (private) residence as the place of occurrence of the external cause: Secondary | ICD-10-CM | POA: Diagnosis not present

## 2021-03-31 DIAGNOSIS — H9190 Unspecified hearing loss, unspecified ear: Secondary | ICD-10-CM | POA: Diagnosis not present

## 2021-03-31 DIAGNOSIS — I700 Atherosclerosis of aorta: Secondary | ICD-10-CM | POA: Diagnosis present

## 2021-03-31 DIAGNOSIS — E1122 Type 2 diabetes mellitus with diabetic chronic kidney disease: Secondary | ICD-10-CM | POA: Diagnosis present

## 2021-03-31 DIAGNOSIS — E782 Mixed hyperlipidemia: Secondary | ICD-10-CM | POA: Diagnosis present

## 2021-03-31 DIAGNOSIS — I129 Hypertensive chronic kidney disease with stage 1 through stage 4 chronic kidney disease, or unspecified chronic kidney disease: Secondary | ICD-10-CM | POA: Diagnosis not present

## 2021-03-31 DIAGNOSIS — D72829 Elevated white blood cell count, unspecified: Secondary | ICD-10-CM

## 2021-03-31 DIAGNOSIS — Z800 Family history of malignant neoplasm of digestive organs: Secondary | ICD-10-CM | POA: Diagnosis not present

## 2021-03-31 DIAGNOSIS — Z20822 Contact with and (suspected) exposure to covid-19: Secondary | ICD-10-CM | POA: Diagnosis not present

## 2021-03-31 DIAGNOSIS — Z8673 Personal history of transient ischemic attack (TIA), and cerebral infarction without residual deficits: Secondary | ICD-10-CM | POA: Diagnosis not present

## 2021-03-31 DIAGNOSIS — Z8701 Personal history of pneumonia (recurrent): Secondary | ICD-10-CM | POA: Diagnosis not present

## 2021-03-31 DIAGNOSIS — M1990 Unspecified osteoarthritis, unspecified site: Secondary | ICD-10-CM | POA: Diagnosis present

## 2021-03-31 DIAGNOSIS — I440 Atrioventricular block, first degree: Secondary | ICD-10-CM | POA: Diagnosis not present

## 2021-03-31 DIAGNOSIS — E042 Nontoxic multinodular goiter: Secondary | ICD-10-CM | POA: Diagnosis not present

## 2021-03-31 DIAGNOSIS — I421 Obstructive hypertrophic cardiomyopathy: Principal | ICD-10-CM

## 2021-03-31 DIAGNOSIS — N183 Chronic kidney disease, stage 3 unspecified: Secondary | ICD-10-CM

## 2021-03-31 DIAGNOSIS — I422 Other hypertrophic cardiomyopathy: Secondary | ICD-10-CM | POA: Diagnosis present

## 2021-03-31 DIAGNOSIS — R55 Syncope and collapse: Secondary | ICD-10-CM | POA: Diagnosis present

## 2021-03-31 DIAGNOSIS — S199XXA Unspecified injury of neck, initial encounter: Secondary | ICD-10-CM | POA: Diagnosis not present

## 2021-03-31 DIAGNOSIS — Z7401 Bed confinement status: Secondary | ICD-10-CM | POA: Diagnosis not present

## 2021-03-31 DIAGNOSIS — E872 Acidosis: Secondary | ICD-10-CM | POA: Diagnosis present

## 2021-03-31 DIAGNOSIS — W19XXXA Unspecified fall, initial encounter: Secondary | ICD-10-CM | POA: Diagnosis present

## 2021-03-31 DIAGNOSIS — R112 Nausea with vomiting, unspecified: Secondary | ICD-10-CM

## 2021-03-31 DIAGNOSIS — Z808 Family history of malignant neoplasm of other organs or systems: Secondary | ICD-10-CM | POA: Diagnosis not present

## 2021-03-31 DIAGNOSIS — R001 Bradycardia, unspecified: Secondary | ICD-10-CM | POA: Diagnosis not present

## 2021-03-31 DIAGNOSIS — Z882 Allergy status to sulfonamides status: Secondary | ICD-10-CM | POA: Diagnosis not present

## 2021-03-31 DIAGNOSIS — I471 Supraventricular tachycardia: Secondary | ICD-10-CM | POA: Diagnosis not present

## 2021-03-31 DIAGNOSIS — S60221A Contusion of right hand, initial encounter: Secondary | ICD-10-CM | POA: Diagnosis present

## 2021-03-31 DIAGNOSIS — E041 Nontoxic single thyroid nodule: Secondary | ICD-10-CM

## 2021-03-31 DIAGNOSIS — Z79899 Other long term (current) drug therapy: Secondary | ICD-10-CM

## 2021-03-31 DIAGNOSIS — I77819 Aortic ectasia, unspecified site: Secondary | ICD-10-CM | POA: Diagnosis not present

## 2021-03-31 DIAGNOSIS — R0781 Pleurodynia: Secondary | ICD-10-CM | POA: Diagnosis not present

## 2021-03-31 DIAGNOSIS — S0990XA Unspecified injury of head, initial encounter: Secondary | ICD-10-CM | POA: Diagnosis not present

## 2021-03-31 DIAGNOSIS — I469 Cardiac arrest, cause unspecified: Secondary | ICD-10-CM | POA: Diagnosis not present

## 2021-03-31 DIAGNOSIS — M6281 Muscle weakness (generalized): Secondary | ICD-10-CM | POA: Diagnosis not present

## 2021-03-31 DIAGNOSIS — N1831 Chronic kidney disease, stage 3a: Secondary | ICD-10-CM | POA: Diagnosis not present

## 2021-03-31 DIAGNOSIS — I77810 Thoracic aortic ectasia: Secondary | ICD-10-CM

## 2021-03-31 DIAGNOSIS — Z8042 Family history of malignant neoplasm of prostate: Secondary | ICD-10-CM

## 2021-03-31 DIAGNOSIS — R5381 Other malaise: Secondary | ICD-10-CM

## 2021-03-31 DIAGNOSIS — I10 Essential (primary) hypertension: Secondary | ICD-10-CM | POA: Diagnosis present

## 2021-03-31 DIAGNOSIS — R059 Cough, unspecified: Secondary | ICD-10-CM | POA: Diagnosis not present

## 2021-03-31 DIAGNOSIS — M19012 Primary osteoarthritis, left shoulder: Secondary | ICD-10-CM | POA: Diagnosis not present

## 2021-03-31 DIAGNOSIS — K2100 Gastro-esophageal reflux disease with esophagitis, without bleeding: Secondary | ICD-10-CM | POA: Diagnosis not present

## 2021-03-31 DIAGNOSIS — E46 Unspecified protein-calorie malnutrition: Secondary | ICD-10-CM | POA: Diagnosis not present

## 2021-03-31 DIAGNOSIS — Z820 Family history of epilepsy and other diseases of the nervous system: Secondary | ICD-10-CM | POA: Diagnosis not present

## 2021-03-31 DIAGNOSIS — I441 Atrioventricular block, second degree: Secondary | ICD-10-CM | POA: Diagnosis not present

## 2021-03-31 DIAGNOSIS — R000 Tachycardia, unspecified: Secondary | ICD-10-CM | POA: Diagnosis not present

## 2021-03-31 LAB — COMPREHENSIVE METABOLIC PANEL
ALT: 17 U/L (ref 0–44)
AST: 25 U/L (ref 15–41)
Albumin: 4.4 g/dL (ref 3.5–5.0)
Alkaline Phosphatase: 56 U/L (ref 38–126)
Anion gap: 11 (ref 5–15)
BUN: 15 mg/dL (ref 8–23)
CO2: 24 mmol/L (ref 22–32)
Calcium: 9.5 mg/dL (ref 8.9–10.3)
Chloride: 105 mmol/L (ref 98–111)
Creatinine, Ser: 0.87 mg/dL (ref 0.44–1.00)
GFR, Estimated: >60 mL/min (ref >60)
Glucose, Bld: 104 mg/dL — ABNORMAL HIGH (ref 70–99)
Potassium: 4.2 mmol/L (ref 3.5–5.1)
Sodium: 140 mmol/L (ref 135–145)
Total Bilirubin: 0.6 mg/dL (ref 0.3–1.2)
Total Protein: 7.6 g/dL (ref 6.5–8.1)

## 2021-03-31 LAB — CBC WITH DIFFERENTIAL/PLATELET
Abs Immature Granulocytes: 0.04 10*3/uL (ref 0.00–0.07)
Basophils Absolute: 0.1 10*3/uL (ref 0.0–0.1)
Basophils Relative: 1 %
Eosinophils Absolute: 0.1 10*3/uL (ref 0.0–0.5)
Eosinophils Relative: 1 %
HCT: 42.5 % (ref 36.0–46.0)
Hemoglobin: 13.5 g/dL (ref 12.0–15.0)
Immature Granulocytes: 0 %
Lymphocytes Relative: 13 %
Lymphs Abs: 1.8 10*3/uL (ref 0.7–4.0)
MCH: 29.1 pg (ref 26.0–34.0)
MCHC: 31.8 g/dL (ref 30.0–36.0)
MCV: 91.6 fL (ref 80.0–100.0)
Monocytes Absolute: 0.7 10*3/uL (ref 0.1–1.0)
Monocytes Relative: 6 %
Neutro Abs: 10.4 10*3/uL — ABNORMAL HIGH (ref 1.7–7.7)
Neutrophils Relative %: 79 %
Platelets: 401 10*3/uL — ABNORMAL HIGH (ref 150–400)
RBC: 4.64 MIL/uL (ref 3.87–5.11)
RDW: 15.5 % (ref 11.5–15.5)
WBC: 13.1 10*3/uL — ABNORMAL HIGH (ref 4.0–10.5)
nRBC: 0 % (ref 0.0–0.2)

## 2021-03-31 LAB — URINALYSIS, ROUTINE W REFLEX MICROSCOPIC
Bilirubin Urine: NEGATIVE
Glucose, UA: NEGATIVE mg/dL
Hgb urine dipstick: NEGATIVE
Ketones, ur: NEGATIVE mg/dL
Nitrite: NEGATIVE
Protein, ur: NEGATIVE mg/dL
Specific Gravity, Urine: 1.009 (ref 1.005–1.030)
pH: 7 (ref 5.0–8.0)

## 2021-03-31 LAB — CBG MONITORING, ED: Glucose-Capillary: 96 mg/dL (ref 70–99)

## 2021-03-31 LAB — RESP PANEL BY RT-PCR (FLU A&B, COVID) ARPGX2
Influenza A by PCR: NEGATIVE
Influenza B by PCR: NEGATIVE
SARS Coronavirus 2 by RT PCR: NEGATIVE

## 2021-03-31 LAB — TROPONIN I (HIGH SENSITIVITY)
Troponin I (High Sensitivity): 6 ng/L (ref <18)
Troponin I (High Sensitivity): 5 ng/L (ref <18)

## 2021-03-31 MED ORDER — AMLODIPINE BESYLATE 10 MG PO TABS
10.0000 mg | ORAL_TABLET | Freq: Every day | ORAL | Status: DC
  Administered 2021-04-01 – 2021-04-07 (×7): 10 mg via ORAL
  Filled 2021-03-31 (×7): qty 1

## 2021-03-31 MED ORDER — ROSUVASTATIN CALCIUM 20 MG PO TABS
20.0000 mg | ORAL_TABLET | Freq: Every day | ORAL | Status: DC
  Administered 2021-03-31 – 2021-04-09 (×10): 20 mg via ORAL
  Filled 2021-03-31 (×11): qty 1

## 2021-03-31 MED ORDER — ACETAMINOPHEN 650 MG RE SUPP: 650.0000 mg | Freq: Four times a day (QID) | RECTAL | Status: DC | PRN

## 2021-03-31 MED ORDER — ALUM & MAG HYDROXIDE-SIMETH 200-200-20 MG/5ML PO SUSP
30.0000 mL | ORAL | Status: DC | PRN
  Administered 2021-04-01 – 2021-04-02 (×2): 30 mL via ORAL
  Filled 2021-03-31 (×3): qty 30

## 2021-03-31 MED ORDER — ACETAMINOPHEN 325 MG PO TABS
650.0000 mg | ORAL_TABLET | Freq: Once | ORAL | Status: AC
  Administered 2021-03-31: 650 mg via ORAL
  Filled 2021-03-31: qty 2

## 2021-03-31 MED ORDER — ACETAMINOPHEN 325 MG PO TABS
650.0000 mg | ORAL_TABLET | Freq: Four times a day (QID) | ORAL | Status: DC | PRN
  Administered 2021-03-31 – 2021-04-09 (×15): 650 mg via ORAL
  Filled 2021-03-31 (×15): qty 2

## 2021-03-31 NOTE — Plan of Care (Signed)
VSS; ongoing

## 2021-03-31 NOTE — ED Provider Notes (Signed)
[facility] DEPT Provider Note   CSN: [id number] Arrival date & time: 03/31/21  1021     History Chief Complaint  Patient presents with   [name]    Anna Hoffman is a 86 y.o. female.  Who presents emergency department after syncope.  Patient states that she went to her kitchen this morning to get her coffee and states the next thing that she knew she was "on the floor."  Her son ran to her.  She had bleeding from her mouth where she lacerated the mucosal surface of her upper lip with her teeth she complains of pain in her mouth.  She has some bruising to the right hand.  She denies lightheadedness, palpitations, racing heart, tunnel vision or tinnitus, melena or hematochezia.  She does not take any blood thinners.  She has never had loss of consciousness before.   Fall      Past Medical History:  Diagnosis Date   Arthritis    Diabetes mellitus without complication ([place])    Hypertension    Hypokalemia    Pneumonia    Shingles    Stroke [facility])     Patient Active Problem List   Diagnosis Date Noted   CAP (community acquired pneumonia) 11/15/2020   Dehydration 11/15/2020   Erosive esophagitis    Hiatal hernia    Hyponatremia 10/10/2020   Acute kidney injury superimposed on chronic kidney disease ([place]) 10/10/2020   Aortic atherosclerosis ([place]) 10/09/2020   Acute renal failure superimposed on stage 3a chronic kidney disease ([place]) 10/09/2020   Hyperkalemia 10/09/2020   Hearing loss 10/09/2020   Prolonged QT interval 10/09/2020   Colitis, acute 08/11/2019   Colitis 07/11/2019   Loose stools 06/07/2019   Herpes zoster without complication 67/07/4579   Other long term (current) drug therapy 08/12/2018   Chronic renal disease, stage III ([place]) 07/19/2018   Hypertensive nephropathy 07/19/2018   Community acquired pneumonia of right upper lobe of lung 11/07/2017   Hypertensive emergency 07/01/2016   Thalamic hemorrhage ([place]) 07/01/2016   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[place])    Benign essential HTN    Type 2 diabetes mellitus with stage 3 chronic kidney disease, without long-term current use of insulin ([place])    Mixed hyperlipidemia    ICH (intracerebral hemorrhage) ([place]) - hypertensive R thalamic hemorrhage  06/27/2016   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   Retroperitoneal bleed 02/08/2016   Generalized abdominal pain    Gastroenteritis 02/04/2016   Diarrhea 02/04/2016   Hypokalemia 02/04/2016   Incarcerated paraesophageal hernia 09/02/2014   Renal mass, left 09/02/2014   Acute esophagitis 09/02/2014   Gastric outlet obstruction 09/02/2014   Hypertension    Vomiting 09/01/2014    Past Surgical History:  Procedure Laterality Date   ABDOMINAL HYSTERECTOMY     BREAST EXCISIONAL BIOPSY Left    ESOPHAGOGASTRODUODENOSCOPY N/A 09/01/2014   Procedure: ESOPHAGOGASTRODUODENOSCOPY (EGD);  Surgeon: Lear Ng, MD;  Location: [facility];  Service: Endoscopy;  Laterality: N/A;   ESOPHAGOGASTRODUODENOSCOPY (EGD) WITH PROPOFOL N/A 10/12/2020   Procedure: ESOPHAGOGASTRODUODENOSCOPY (EGD) WITH PROPOFOL;  Surgeon: Mauri Pole, MD;  Location: [facility];  Service: Endoscopy;  Laterality: N/A;   HIATAL HERNIA REPAIR N/A 09/04/2014   Procedure: LAPAROSCOPIC REPAIR OF HIATAL HERNIA;  Surgeon: Excell Seltzer, MD;  Location: [facility];  Service: General;  Laterality: N/A;  With MESH   IR GENERIC HISTORICAL  04/10/2016   IR US GUIDE VASC ACCESS RIGHT 04/10/2016 Corrie Mckusick, DO [facility]   IR  GENERIC HISTORICAL  04/10/2016   IR ANGIOGRAM SELECTIVE EACH ADDITIONAL VESSEL 04/10/2016 Corrie Mckusick, DO [facility]   IR GENERIC HISTORICAL  04/10/2016   IR ANGIOGRAM SELECTIVE EACH ADDITIONAL VESSEL 04/10/2016 Corrie Mckusick, DO [facility]   IR GENERIC HISTORICAL  04/10/2016   IR EMBO TUMOR ORGAN ISCHEMIA INFARCT INC GUIDE ROADMAPPING 04/10/2016 Corrie Mckusick, DO [facility]   IR GENERIC HISTORICAL  04/10/2016   IR ANGIOGRAM SELECTIVE EACH  ADDITIONAL VESSEL 04/10/2016 Corrie Mckusick, DO [facility]   IR GENERIC HISTORICAL  04/10/2016   IR ANGIOGRAM SELECTIVE EACH ADDITIONAL VESSEL 04/10/2016 Corrie Mckusick, DO [facility]   IR GENERIC HISTORICAL  04/10/2016   IR RENAL SELECTIVE  UNI INC S&I MOD SED 04/10/2016 Corrie Mckusick, DO [facility]   IR GENERIC HISTORICAL  03/06/2016   IR RADIOLOGIST EVAL & MGMT 03/06/2016 Corrie Mckusick, DO [facility]   IR GENERIC HISTORICAL  03/26/2016   IR RADIOLOGIST EVAL & MGMT 03/26/2016 Corrie Mckusick, DO [facility]   IR GENERIC HISTORICAL  04/29/2016   IR RADIOLOGIST EVAL & MGMT 04/29/2016 [facility]   IR RADIOLOGIST EVAL & MGMT  11/12/2016   IR RADIOLOGIST EVAL & MGMT  12/16/2017   KNEE SURGERY     TONSILLECTOMY       OB History   No obstetric history on file.     Family History  Problem Relation Age of Onset   Alzheimer's disease Mother    Prostate cancer Father    Brain cancer Brother    Brain cancer Brother    Pancreatic cancer Sister    Allergic rhinitis Neg Hx    Asthma Neg Hx    Eczema Neg Hx    Urticaria Neg Hx     Social History   Tobacco Use   Smoking status: Never   Smokeless tobacco: Never  Vaping Use   Vaping Use: Never used  Substance Use Topics   Alcohol use: No   Drug use: No    Home Medications Prior to Admission medications   Medication Sig Start Date End Date Taking? Authorizing Provider  acetaminophen (TYLENOL) 325 MG tablet Take 2 tablets (650 mg total) by mouth every 6 (six) hours as needed for mild pain (or Fever >/= 101). 02/08/16   Johnson, Clanford L, MD  amLODipine (NORVASC) 10 MG tablet TAKE 1 TABLET BY MOUTH ONCE DAILY Patient taking differently: Take 10 mg by mouth daily. 08/24/20 08/24/21  Glendale Chard, MD  Ascorbic Acid (VITAMIN C) 1000 MG tablet Take 1,000 mg by mouth daily.    [provider]  azelastine (ASTELIN) 0.1 % nasal spray Place 2 sprays into both nostrils daily as needed for rhinitis. 10/13/20   Hongalgi, Lenis Dickinson, MD   Carboxymethylcellul-Glycerin (LUBRICATING EYE DROPS OP) Apply 1 drop to eye daily as needed (dry eyes).     [provider]  Fexofenadine HCl (ALLEGRA PO) Take 180 mg by mouth at bedtime.     [provider]  glucose blood test strip Use as directed to check blood sugars 1 time per day. 03/01/21   Glendale Chard, MD  glucose monitoring kit (FREESTYLE) monitoring kit Use as directed to check blood sugars 1 time per day dx: e11.22 03/01/21   Glendale Chard, MD  Lancets [facility] DELICA PLUS [id number]) MISC Use to check blood sugar once daily 02/07/21     pantoprazole (PROTONIX) 40 MG tablet TAKE 1 TABLET BY MOUTH TWICE A DAY BEFORE MEALS 12/10/20   Glendale Chard, MD  rosuvastatin (CRESTOR) 20 MG tablet TAKE 1 TABLET BY MOUTH ONCE DAILY 08/31/20 08/31/21  Glendale Chard, MD  sucralfate (CARAFATE) 1 GM/10ML suspension TAKE 10 MLS (1 G TOTAL) BY MOUTH 4 (FOUR) TIMES DAILY - WITH MEALS AND AT BEDTIME FOR 28 DAYS. 01/01/21 03/21/21  Glendale Chard, MD  trolamine salicylate (ASPERCREME) 10 % cream Apply 1 application topically daily as needed for muscle pain.    [provider]  vitamin B-12 (CYANOCOBALAMIN) 100 MCG tablet Take 100 mcg by mouth daily.    [provider]    Allergies    Amoxicillin, Ampicillin, and Sulfa antibiotics  Review of Systems   Review of Systems Ten systems reviewed and are negative for acute change, except as noted in the HPI.   Physical Exam Updated Vital Signs BP (!) 169/92   Pulse 95   Temp 97.9 F (36.6 C) (Oral)   Resp 17   LMP  (LMP Unknown)   SpO2 98%   Physical Exam Vitals and nursing note reviewed.  Constitutional:      General: She is not in acute distress.    Appearance: She is well-developed. She is not diaphoretic.  HENT:     Head: Normocephalic.     Right Ear: External ear normal.     Left Ear: External ear normal.     Nose: Nose normal.     Mouth/Throat:     Mouth: Mucous membranes are moist.     Comments: 2  punctures to the mucosal surface of the upper lip.  There is obvious bruising, it is not a through and through laceration.  No subluxation or tenderness of the teeth. Eyes:     General: No scleral icterus.    Conjunctiva/sclera: Conjunctivae normal.  Cardiovascular:     Rate and Rhythm: Normal rate and regular rhythm.     Heart sounds: Normal heart sounds. No murmur heard.   No friction rub. No gallop.  Pulmonary:     Effort: Pulmonary effort is normal. No respiratory distress.     Breath sounds: Normal breath sounds.  Abdominal:     General: Bowel sounds are normal. There is no distension.     Palpations: Abdomen is soft. There is no mass.     Tenderness: There is no abdominal tenderness. There is no guarding.  Musculoskeletal:     Cervical back: Normal range of motion.  Skin:    General: Skin is warm and dry.  Neurological:     Mental Status: She is alert and oriented to person, place, and time.  Psychiatric:        Behavior: Behavior normal.    ED Results / Procedures / Treatments   Labs (all labs ordered are listed, but only abnormal results are displayed) Labs Reviewed  COMPREHENSIVE METABOLIC PANEL  CBC WITH DIFFERENTIAL/PLATELET  URINALYSIS, ROUTINE W REFLEX MICROSCOPIC  CBG MONITORING, ED  TROPONIN I (HIGH SENSITIVITY)  TROPONIN I (HIGH SENSITIVITY)    EKG None  Radiology DG Ribs Unilateral W/Chest Left  Result Date: 03/31/2021 CLINICAL DATA:  Left rib pain, fall EXAM: LEFT RIBS AND CHEST - 3+ VIEW COMPARISON:  November 07, 2017 FINDINGS: The cardiomediastinal silhouette is unchanged in contour.Tortuous thoracic aorta. Atherosclerotic calcifications. No pleural effusion. No pneumothorax. Scattered nodular opacity of the RIGHT upper lobe, likely infectious or inflammatory in etiology. Visualized abdomen is unremarkable. Remote RIGHT clavicular fracture, undulating contour of multiple LEFT-sided ribs consistent with sequela of remote fracture. Subjacent to the site of  painful concern marker, no definitive acute  displaced rib fracture visualized. There is a subtle lucency along the costochondral junction which could reflect a nondisplaced fracture at the site of painful concern. IMPRESSION: 1. There is a possible nondisplaced rib fracture subjacent to the site of palpable concern. No acute displaced rib fracture visualized. There is a sequela of multiple favored remote LEFT-sided rib fractures. Electronically Signed   By: Valentino Saxon M.D.   On: 03/31/2021 12:18   CT Head Wo Contrast  Result Date: 03/31/2021 CLINICAL DATA:  Head trauma, minor (Age >= 65y); Neck trauma (Age >= 65y); Facial trauma EXAM: CT HEAD WITHOUT CONTRAST CT MAXILLOFACIAL WITHOUT CONTRAST CT CERVICAL SPINE WITHOUT CONTRAST TECHNIQUE: Multidetector CT imaging of the head, cervical spine, and maxillofacial structures were performed using the standard protocol without intravenous contrast. Multiplanar CT image reconstructions of the cervical spine and maxillofacial structures were also generated. COMPARISON:  February 13, 2019, October 06, 2020 FINDINGS: CT HEAD FINDINGS Brain: No evidence of acute infarction, hemorrhage, hydrocephalus, extra-axial collection or mass lesion/mass effect. Global parenchymal volume loss. Periventricular white matter hypodensities consistent with sequela of chronic microvascular ischemic disease. Vascular: Vascular calcifications. Skull: Normal. Negative for fracture or focal lesion. Other: None. CT MAXILLOFACIAL FINDINGS Osseous: No fracture or mandibular dislocation. No destructive process. Well corticated ossific density of the LEFT TMJ consistent with sequela of remote prior trauma or degenerative changes, unchanged. Orbits: Negative. No traumatic or inflammatory finding. Sinuses: Unchanged mucosal thickening of the LEFT sphenoid sinus. Mild RIGHT frontal sinus mucosal thickening. Soft tissues: There is a 20 mm RIGHT thyroid nodule. CT CERVICAL SPINE FINDINGS Alignment:  Unchanged 4 mm of anterolisthesis of C3-4. No acute traumatic listhesis is identified. Skull base and vertebrae: No acute fracture. No primary bone lesion or focal pathologic process. Soft tissues and spinal canal: No prevertebral fluid or swelling. No visible canal hematoma. Disc levels: Severe intervertebral disc space height loss at C5-6, C6-7, C7-T1. Moderate intervertebral disc space height loss of C4-5. Multilevel endplate proliferative changes and uncovertebral hypertrophy as well as facet arthropathy. This results in multilevel osseous neuroforaminal narrowing. No high-grade canal stenosis. Upper chest: Atherosclerotic calcifications. Other: None. IMPRESSION: 1.  No acute intracranial abnormality. 2. No acute fracture or acute traumatic subluxation of the cervical spine. 3. No acute facial bone fracture. 4. There is an 20 mm RIGHT thyroid nodule. Recommend nonemergent thyroid US (ref: J Am Coll Radiol. 2015 Feb;12(2): 143-50). Aortic Atherosclerosis (ICD10-I70.0). Electronically Signed   By: Valentino Saxon M.D.   On: 03/31/2021 12:36   CT Cervical Spine Wo Contrast  Result Date: 03/31/2021 CLINICAL DATA:  Head trauma, minor (Age >= 65y); Neck trauma (Age >= 65y); Facial trauma EXAM: CT HEAD WITHOUT CONTRAST CT MAXILLOFACIAL WITHOUT CONTRAST CT CERVICAL SPINE WITHOUT CONTRAST TECHNIQUE: Multidetector CT imaging of the head, cervical spine, and maxillofacial structures were performed using the standard protocol without intravenous contrast. Multiplanar CT image reconstructions of the cervical spine and maxillofacial structures were also generated. COMPARISON:  February 13, 2019, October 06, 2020 FINDINGS: CT HEAD FINDINGS Brain: No evidence of acute infarction, hemorrhage, hydrocephalus, extra-axial collection or mass lesion/mass effect. Global parenchymal volume loss. Periventricular white matter hypodensities consistent with sequela of chronic microvascular ischemic disease. Vascular: Vascular  calcifications. Skull: Normal. Negative for fracture or focal lesion. Other: None. CT MAXILLOFACIAL FINDINGS Osseous: No fracture or mandibular dislocation. No destructive process. Well corticated ossific density of the LEFT TMJ consistent with sequela of remote prior trauma or degenerative changes, unchanged. Orbits: Negative. No traumatic or inflammatory finding. Sinuses: Unchanged mucosal thickening of  the LEFT sphenoid sinus. Mild RIGHT frontal sinus mucosal thickening. Soft tissues: There is a 20 mm RIGHT thyroid nodule. CT CERVICAL SPINE FINDINGS Alignment: Unchanged 4 mm of anterolisthesis of C3-4. No acute traumatic listhesis is identified. Skull base and vertebrae: No acute fracture. No primary bone lesion or focal pathologic process. Soft tissues and spinal canal: No prevertebral fluid or swelling. No visible canal hematoma. Disc levels: Severe intervertebral disc space height loss at C5-6, C6-7, C7-T1. Moderate intervertebral disc space height loss of C4-5. Multilevel endplate proliferative changes and uncovertebral hypertrophy as well as facet arthropathy. This results in multilevel osseous neuroforaminal narrowing. No high-grade canal stenosis. Upper chest: Atherosclerotic calcifications. Other: None. IMPRESSION: 1.  No acute intracranial abnormality. 2. No acute fracture or acute traumatic subluxation of the cervical spine. 3. No acute facial bone fracture. 4. There is an 20 mm RIGHT thyroid nodule. Recommend nonemergent thyroid US (ref: J Am Coll Radiol. 2015 Feb;12(2): 143-50). Aortic Atherosclerosis (ICD10-I70.0). Electronically Signed   By: Valentino Saxon M.D.   On: 03/31/2021 12:36   CT Maxillofacial WO CM  Result Date: 03/31/2021 CLINICAL DATA:  Head trauma, minor (Age >= 65y); Neck trauma (Age >= 65y); Facial trauma EXAM: CT HEAD WITHOUT CONTRAST CT MAXILLOFACIAL WITHOUT CONTRAST CT CERVICAL SPINE WITHOUT CONTRAST TECHNIQUE: Multidetector CT imaging of the head, cervical spine, and  maxillofacial structures were performed using the standard protocol without intravenous contrast. Multiplanar CT image reconstructions of the cervical spine and maxillofacial structures were also generated. COMPARISON:  February 13, 2019, October 06, 2020 FINDINGS: CT HEAD FINDINGS Brain: No evidence of acute infarction, hemorrhage, hydrocephalus, extra-axial collection or mass lesion/mass effect. Global parenchymal volume loss. Periventricular white matter hypodensities consistent with sequela of chronic microvascular ischemic disease. Vascular: Vascular calcifications. Skull: Normal. Negative for fracture or focal lesion. Other: None. CT MAXILLOFACIAL FINDINGS Osseous: No fracture or mandibular dislocation. No destructive process. Well corticated ossific density of the LEFT TMJ consistent with sequela of remote prior trauma or degenerative changes, unchanged. Orbits: Negative. No traumatic or inflammatory finding. Sinuses: Unchanged mucosal thickening of the LEFT sphenoid sinus. Mild RIGHT frontal sinus mucosal thickening. Soft tissues: There is a 20 mm RIGHT thyroid nodule. CT CERVICAL SPINE FINDINGS Alignment: Unchanged 4 mm of anterolisthesis of C3-4. No acute traumatic listhesis is identified. Skull base and vertebrae: No acute fracture. No primary bone lesion or focal pathologic process. Soft tissues and spinal canal: No prevertebral fluid or swelling. No visible canal hematoma. Disc levels: Severe intervertebral disc space height loss at C5-6, C6-7, C7-T1. Moderate intervertebral disc space height loss of C4-5. Multilevel endplate proliferative changes and uncovertebral hypertrophy as well as facet arthropathy. This results in multilevel osseous neuroforaminal narrowing. No high-grade canal stenosis. Upper chest: Atherosclerotic calcifications. Other: None. IMPRESSION: 1.  No acute intracranial abnormality. 2. No acute fracture or acute traumatic subluxation of the cervical spine. 3. No acute facial bone fracture.  4. There is an 20 mm RIGHT thyroid nodule. Recommend nonemergent thyroid US (ref: J Am Coll Radiol. 2015 Feb;12(2): 143-50). Aortic Atherosclerosis (ICD10-I70.0). Electronically Signed   By: Valentino Saxon M.D.   On: 03/31/2021 12:36    Procedures Procedures   Medications Ordered in ED Medications  acetaminophen (TYLENOL) tablet 650 mg (has no administration in time range)    ED Course  I have reviewed the triage vital signs and the nursing notes.  Pertinent labs & imaging results that were available during my care of the patient were reviewed by me and considered in my medical decision making (  see chart for details).    MDM Rules/Calculators/A&P                          86 year old female here with syncope.The differential for syncope is extensive and includes, but is not limited to: arrythmia (Vtach, SVT, SSS, sinus arrest, AV block, bradycardia) aortic stenosis, AMI, HOCM, PE, atrial myxoma, pulmonary hypertension, orthostatic hypotension, (hypovolemia, drug effect, GB syndrome, micturition, cough, swall) carotid sinus sensitivity, Seizure, TIA/CVA, hypoglycemia,  Vertigo. Patient is medium risk per [place] syncope score and given her lack of prodrome her symptoms are concerning for potential cardiac cause.  I ordered and reviewed labs that include CBC with mildly elevated white blood cell count, initial troponin within normal limits, CBG 96, CMP without significant abnormality.  I ordered and reviewed images including CT head C-spine and maxillofacial which showed no acute abnormalities, rib view shows potential rib fracture however patient does not appear to have any point tenderness at this time and has multiple old fractures.  Patient will need admission for syncope Final Clinical Impression(s) / ED Diagnoses Final diagnoses:  None    Rx / DC Orders ED Discharge Orders     None        Margarita Mail, PA-C 03/31/21 [place], [place], DO 03/31/21 2304

## 2021-03-31 NOTE — ED Triage Notes (Signed)
Pt reports falling on her way to the kitchen this morning and busting her lip. Pt endorses lip, left sided ribcage, and right hand pain. Pt denies LOC and taking blood thinners.

## 2021-03-31 NOTE — ED Provider Notes (Signed)
I personally evaluated the patient during the encounter and completed a history, physical, procedures, medical decision making to contribute to the overall care of the patient and decision making for the patient briefly, the patient is a 86 y.o. female is here after syncopal event.  Patient with history of hypertension, diabetes, stroke.  Had unprovoked syncopal event.  Work-up unremarkable.  Neurologically she is intact.  Will admit to medicine for further syncope work-up.  This chart was dictated using voice recognition software.  Despite best efforts to proofread,  errors can occur which can change the documentation meaning.    EKG Interpretation  Date/Time:  Sunday March 31 2021 11:36:37 EDT Ventricular Rate:  98 PR Interval:  271 QRS Duration: 76 QT Interval:  340 QTC Calculation: 435 R Axis:   9 Text Interpretation: Sinus rhythm Prolonged PR interval Probable left atrial enlargement Anteroseptal infarct, old Nonspecific T abnormalities, lateral leads Confirmed by Lennice Sites [phone]) on 03/31/2021 12:48:57 PM           Lennice Sites, DO 03/31/21 1457

## 2021-03-31 NOTE — H&P (Signed)
History and Physical    Anna Hoffman WNU:[id number] DOB: 1935/03/13 DOA: 03/31/2021  PCP: Glendale Chard, MD Patient coming from: Home  Chief Complaint: Syncope  HPI: Anna Hoffman is a 86 y.o. female with medical history significant of T2DM, HTN, CVA, who presented with syncope and fall at home.  Patient lives with her son at home.  She was in her usual state of health until this morning.  Patient states that she went to her kitchen this morning to get her coffee, and she apparently fell onto the kitchen floor facing down.  Her son heard noise and came to assist her back to the chair when she woke up.  The duration was less than 1 minute.  She had mild upper lip bleeding.  Denies prodromal symptoms, dizziness, vision change, urinary/bowel incontinence, and postictal confusion.  Denies fevers, chills, shortness of breath, cough, wheezing, chest pain, palpitations, nausea, vomiting, diarrhea, abdominal pain, dysuria, urinary frequency or urgency.  Patient came to ED for further evaluation.  In the emergency room, she was afebrile with a pulse 98, RR 18, BP 175/96 and room air O2 sats 97%.  The labs showed nonrevealing CMP.  WBC 13.1.  Negative troponins.  Glucose 104.  EKG showed no ischemic change.  Imaging studies showed no acute changes.   Review of Systems: As per HPI otherwise 10 point review of systems negative.  Review of Systems Otherwise negative except as per HPI, including: General: Denies fever, chills, night sweats or unintended weight loss. Resp: Denies cough, wheezing, shortness of breath. Cardiac: Denies chest pain, palpitations, orthopnea, paroxysmal nocturnal dyspnea. GI: Denies abdominal pain, nausea, vomiting, diarrhea or constipation GU: Denies dysuria, frequency, hesitancy or incontinence MS: Denies muscle aches, joint pain or swelling Neuro: Denies headache, neurologic deficits (focal weakness, numbness, tingling), abnormal gait Psych: Denies anxiety, depression,  SI/HI/AVH Skin: Denies new rashes or lesions ID: Denies sick contacts, exotic exposures, travel  Past Medical History:  Diagnosis Date   Arthritis    Diabetes mellitus without complication ([place])    Hypertension    Hypokalemia    Pneumonia    Shingles    Stroke [facility])     Past Surgical History:  Procedure Laterality Date   ABDOMINAL HYSTERECTOMY     BREAST EXCISIONAL BIOPSY Left    ESOPHAGOGASTRODUODENOSCOPY N/A 09/01/2014   Procedure: ESOPHAGOGASTRODUODENOSCOPY (EGD);  Surgeon: Lear Ng, MD;  Location: [facility];  Service: Endoscopy;  Laterality: N/A;   ESOPHAGOGASTRODUODENOSCOPY (EGD) WITH PROPOFOL N/A 10/12/2020   Procedure: ESOPHAGOGASTRODUODENOSCOPY (EGD) WITH PROPOFOL;  Surgeon: Mauri Pole, MD;  Location: [facility];  Service: Endoscopy;  Laterality: N/A;   HIATAL HERNIA REPAIR N/A 09/04/2014   Procedure: LAPAROSCOPIC REPAIR OF HIATAL HERNIA;  Surgeon: Excell Seltzer, MD;  Location: [facility];  Service: General;  Laterality: N/A;  With MESH   IR GENERIC HISTORICAL  04/10/2016   IR US GUIDE VASC ACCESS RIGHT 04/10/2016 Corrie Mckusick, DO [facility]   IR GENERIC HISTORICAL  04/10/2016   IR ANGIOGRAM SELECTIVE EACH ADDITIONAL VESSEL 04/10/2016 Corrie Mckusick, DO [facility]   IR GENERIC HISTORICAL  04/10/2016   IR ANGIOGRAM SELECTIVE EACH ADDITIONAL VESSEL 04/10/2016 Corrie Mckusick, DO [facility]   IR GENERIC HISTORICAL  04/10/2016   IR EMBO TUMOR ORGAN ISCHEMIA INFARCT INC GUIDE ROADMAPPING 04/10/2016 Corrie Mckusick, DO [facility]   IR GENERIC HISTORICAL  04/10/2016   IR ANGIOGRAM SELECTIVE EACH ADDITIONAL VESSEL 04/10/2016 Corrie Mckusick, DO [facility]   IR GENERIC HISTORICAL  04/10/2016   IR  ANGIOGRAM SELECTIVE EACH ADDITIONAL VESSEL 04/10/2016 Corrie Mckusick, DO [facility]   IR GENERIC HISTORICAL  04/10/2016   IR RENAL SELECTIVE  UNI INC S&I MOD SED 04/10/2016 Corrie Mckusick, DO [facility]   IR GENERIC HISTORICAL  03/06/2016   IR RADIOLOGIST EVAL & MGMT 03/06/2016 Corrie Mckusick, DO [facility]   IR GENERIC HISTORICAL  03/26/2016   IR RADIOLOGIST EVAL & MGMT 03/26/2016 Corrie Mckusick, DO [facility]   IR GENERIC HISTORICAL  04/29/2016   IR RADIOLOGIST EVAL & MGMT 04/29/2016 [facility]   IR RADIOLOGIST EVAL & MGMT  11/12/2016   IR RADIOLOGIST EVAL & MGMT  12/16/2017   KNEE SURGERY     TONSILLECTOMY      SOCIAL HISTORY:  reports that she has never smoked. She has never used smokeless tobacco. She reports that she does not drink alcohol and does not use drugs.  Allergies  Allergen Reactions   Amoxicillin Diarrhea    Has patient had a PCN reaction causing immediate rash, facial/tongue/throat swelling, SOB or lightheadedness with hypotension: no Has patient had a PCN reaction causing severe rash involving mucus membranes or skin necrosis: no Has patient had a PCN reaction that required hospitalization: no pharmacist consult Has patient had a PCN reaction occurring within the last 10 years: yes If all of the above answers are "NO", then may proceed with Cephalosporin use.    Ampicillin Rash    - Tolerates Rocephin and Ancef - Remote occurrence; no symptoms of anaphylaxis or severe cutaneous reaction, and no additional medical attention required     Sulfa Antibiotics Rash    FAMILY HISTORY: Family History  Problem Relation Age of Onset   Alzheimer's disease Mother    Prostate cancer Father    Brain cancer Brother    Brain cancer Brother    Pancreatic cancer Sister    Allergic rhinitis Neg Hx    Asthma Neg Hx    Eczema Neg Hx    Urticaria Neg Hx      Prior to Admission medications   Medication Sig Start Date End Date Taking? Authorizing Provider  acetaminophen (TYLENOL) 325 MG tablet Take 2 tablets (650 mg total) by mouth every 6 (six) hours as needed for mild pain (or Fever >/= 101). 02/08/16  Yes Johnson, Clanford L, MD  amLODipine (NORVASC) 10 MG tablet TAKE 1 TABLET BY MOUTH ONCE DAILY Patient taking differently: Take 10 mg by  mouth daily. 08/24/20 08/24/21 Yes Glendale Chard, MD  Ascorbic Acid (VITAMIN C) 1000 MG tablet Take 1,000 mg by mouth daily.   Yes [provider]  azelastine (ASTELIN) 0.1 % nasal spray Place 2 sprays into both nostrils daily as needed for rhinitis. 10/13/20  Yes Hongalgi, Lenis Dickinson, MD  Carboxymethylcellul-Glycerin (LUBRICATING EYE DROPS OP) Place 1 drop into both eyes daily as needed (dry eyes).   Yes [provider]  Fexofenadine HCl (ALLEGRA PO) Take 180 mg by mouth at bedtime.    Yes [provider]  pantoprazole (PROTONIX) 40 MG tablet TAKE 1 TABLET BY MOUTH TWICE A DAY BEFORE MEALS Patient taking differently: Take 40 mg by mouth daily. 12/10/20  Yes Glendale Chard, MD  rosuvastatin (CRESTOR) 20 MG tablet TAKE 1 TABLET BY MOUTH ONCE DAILY Patient taking differently: Take 20 mg by mouth daily. 08/31/20 08/31/21 Yes Glendale Chard, MD  trolamine salicylate (ASPERCREME) 10 % cream Apply 1 application topically daily as needed for muscle pain.   Yes [provider]  vitamin B-12 (CYANOCOBALAMIN)  100 MCG tablet Take 100 mcg by mouth daily.   Yes [provider]  glucose blood test strip Use as directed to check blood sugars 1 time per day. 03/01/21   Glendale Chard, MD  glucose monitoring kit (FREESTYLE) monitoring kit Use as directed to check blood sugars 1 time per day dx: e11.22 03/01/21   Glendale Chard, MD  Lancets [facility] DELICA PLUS [id number]) MISC Use to check blood sugar once daily 02/07/21     sucralfate (CARAFATE) 1 GM/10ML suspension TAKE 10 MLS (1 G TOTAL) BY MOUTH 4 (FOUR) TIMES DAILY - WITH MEALS AND AT BEDTIME FOR 28 DAYS. Patient not taking: Reported on 03/31/2021 01/01/21 03/21/21  Glendale Chard, MD    Physical Exam: Vitals:   03/31/21 1300 03/31/21 1345 03/31/21 1415 03/31/21 1645  BP: (!) 155/99 (!) 157/79 (!) 141/95 (!) 141/90  Pulse: 96 100 (!) 103 (!) 102  Resp: 15 20 (!) 23 16  Temp:      TempSrc:      SpO2: 100% 100% 100% 99%       Constitutional: D, calm, comfortable Eyes: PERRL, lids and conjunctivae normal ENMT: Mucous membranes are moist. Posterior pharynx clear of any exudate or lesions.Normal dentition.  Neck: normal, supple, no masses, no thyromegaly Respiratory: clear to auscultation bilaterally, no wheezing, no crackles. Normal respiratory effort. No accessory muscle use.  Cardiovascular: Regular rate and rhythm, no murmurs / rubs / gallops. No extremity edema. 2+ pedal pulses. No carotid bruits.  Abdomen: no tenderness, no masses palpated. No hepatosplenomegaly. Bowel sounds positive.  Musculoskeletal: no clubbing / cyanosis. No joint deformity upper and lower extremities. Good ROM, no contractures. Normal muscle tone.  Skin: no rashes, lesions, ulcers. No induration Neurologic: CN 2-12 grossly intact. Sensation intact, DTR normal. Strength 5/5 in all 4.  Psychiatric: Normal judgment and insight. Alert and oriented x 3. Normal mood.     Labs on Admission: I have personally reviewed following labs and imaging studies  CBC: Recent Labs  Lab 03/31/21 1220  WBC 13.1*  NEUTROABS 10.4*  HGB 13.5  HCT 42.5  MCV 91.6  PLT 546*   Basic Metabolic Panel: Recent Labs  Lab 03/31/21 1220   140  K 4.2  CL 105  CO2 24  GLUCOSE 104*  BUN 15  CREATININE 0.87  CALCIUM 9.5   GFR: Estimated Creatinine Clearance: 44.2 mL/min (by C-G formula based on SCr of 0.87 mg/dL). Liver Function Tests: Recent Labs  Lab 03/31/21 1220  AST 25  ALT 17  ALKPHOS 56  BILITOT 0.6  PROT 7.6  ALBUMIN 4.4   No results for input(s): LIPASE, AMYLASE in the last 168 hours. No results for input(s): AMMONIA in the last 168 hours. Coagulation Profile: No results for input(s): INR, PROTIME in the last 168 hours. Cardiac Enzymes: No results for input(s): CKTOTAL, CKMB, CKMBINDEX, TROPONINI in the last 168 hours. BNP (last 3 results) No results for input(s): PROBNP in the last 8760 hours. HbA1C: No results for  input(s): HGBA1C in the last 72 hours. CBG: Recent Labs  Lab 03/31/21 1208  GLUCAP 96   Lipid Profile: No results for input(s): CHOL, HDL, LDLCALC, TRIG, CHOLHDL, LDLDIRECT in the last 72 hours. Thyroid Function Tests: No results for input(s): TSH, T4TOTAL, FREET4, T3FREE, THYROIDAB in the last 72 hours. Anemia Panel: No results for input(s): VITAMINB12, FOLATE, FERRITIN, TIBC, IRON, RETICCTPCT in the last 72 hours. Urine analysis:    Component Value Date/Time   COLORURINE YELLOW 11/14/2020 2100   [place]  11/14/2020 2100   LABSPEC 1.020 11/14/2020 2100   PHURINE 5.0 11/14/2020 2100   GLUCOSEU NEGATIVE 11/14/2020 2100   HGBUR NEGATIVE 11/14/2020 2100   BILIRUBINUR NEGATIVE 11/14/2020 2100   BILIRUBINUR Negative 11/13/2020 0738   KETONESUR 20 (A) 11/14/2020 2100   PROTEINUR 30 (A) 11/14/2020 2100   UROBILINOGEN 0.2 11/13/2020 0738   UROBILINOGEN 0.2 09/01/2014 0300   NITRITE NEGATIVE 11/14/2020 2100   LEUKOCYTESUR SMALL (A) 11/14/2020 2100   Sepsis Labs: !!!!!!!!!!!!!!!!!!!!!!!!!!!!!!!!!!!!!!!!!!!! _0 (procalcitonin:4,lacticidven:4) )No results found for this or any previous visit (from the past 240 hour(s)).   Radiological Exams on Admission: DG Ribs Unilateral W/Chest Left  Result Date: 03/31/2021 CLINICAL DATA:  Left rib pain, fall EXAM: LEFT RIBS AND CHEST - 3+ VIEW COMPARISON:  November 07, 2017 FINDINGS: The cardiomediastinal silhouette is unchanged in contour.Tortuous thoracic aorta. Atherosclerotic calcifications. No pleural effusion. No pneumothorax. Scattered nodular opacity of the RIGHT upper lobe, likely infectious or inflammatory in etiology. Visualized abdomen is unremarkable. Remote RIGHT clavicular fracture, undulating contour of multiple LEFT-sided ribs consistent with sequela of remote fracture. Subjacent to the site of painful concern marker, no definitive acute displaced rib fracture visualized. There is a subtle lucency along the costochondral  junction which could reflect a nondisplaced fracture at the site of painful concern. IMPRESSION: 1. There is a possible nondisplaced rib fracture subjacent to the site of palpable concern. No acute displaced rib fracture visualized. There is a sequela of multiple favored remote LEFT-sided rib fractures. Electronically Signed   By: Valentino Saxon M.D.   On: 03/31/2021 12:18   CT Head Wo Contrast  Result Date: 03/31/2021 CLINICAL DATA:  Head trauma, minor (Age >= 65y); Neck trauma (Age >= 65y); Facial trauma EXAM: CT HEAD WITHOUT CONTRAST CT MAXILLOFACIAL WITHOUT CONTRAST CT CERVICAL SPINE WITHOUT CONTRAST TECHNIQUE: Multidetector CT imaging of the head, cervical spine, and maxillofacial structures were performed using the standard protocol without intravenous contrast. Multiplanar CT image reconstructions of the cervical spine and maxillofacial structures were also generated. COMPARISON:  February 13, 2019, October 06, 2020 FINDINGS: CT HEAD FINDINGS Brain: No evidence of acute infarction, hemorrhage, hydrocephalus, extra-axial collection or mass lesion/mass effect. Global parenchymal volume loss. Periventricular white matter hypodensities consistent with sequela of chronic microvascular ischemic disease. Vascular: Vascular calcifications. Skull: Normal. Negative for fracture or focal lesion. Other: None. CT MAXILLOFACIAL FINDINGS Osseous: No fracture or mandibular dislocation. No destructive process. Well corticated ossific density of the LEFT TMJ consistent with sequela of remote prior trauma or degenerative changes, unchanged. Orbits: Negative. No traumatic or inflammatory finding. Sinuses: Unchanged mucosal thickening of the LEFT sphenoid sinus. Mild RIGHT frontal sinus mucosal thickening. Soft tissues: There is a 20 mm RIGHT thyroid nodule. CT CERVICAL SPINE FINDINGS Alignment: Unchanged 4 mm of anterolisthesis of C3-4. No acute traumatic listhesis is identified. Skull base and vertebrae: No acute fracture. No  primary bone lesion or focal pathologic process. Soft tissues and spinal canal: No prevertebral fluid or swelling. No visible canal hematoma. Disc levels: Severe intervertebral disc space height loss at C5-6, C6-7, C7-T1. Moderate intervertebral disc space height loss of C4-5. Multilevel endplate proliferative changes and uncovertebral hypertrophy as well as facet arthropathy. This results in multilevel osseous neuroforaminal narrowing. No high-grade canal stenosis. Upper chest: Atherosclerotic calcifications. Other: None. IMPRESSION: 1.  No acute intracranial abnormality. 2. No acute fracture or acute traumatic subluxation of the cervical spine. 3. No acute facial bone fracture. 4. There is an 20 mm RIGHT thyroid nodule. Recommend nonemergent thyroid US (ref: J Am Coll Radiol.  2015 Feb;12(2): 143-50). Aortic Atherosclerosis (ICD10-I70.0). Electronically Signed   By: Valentino Saxon M.D.   On: 03/31/2021 12:36   CT Cervical Spine Wo Contrast  Result Date: 03/31/2021 CLINICAL DATA:  Head trauma, minor (Age >= 65y); Neck trauma (Age >= 65y); Facial trauma EXAM: CT HEAD WITHOUT CONTRAST CT MAXILLOFACIAL WITHOUT CONTRAST CT CERVICAL SPINE WITHOUT CONTRAST TECHNIQUE: Multidetector CT imaging of the head, cervical spine, and maxillofacial structures were performed using the standard protocol without intravenous contrast. Multiplanar CT image reconstructions of the cervical spine and maxillofacial structures were also generated. COMPARISON:  February 13, 2019, October 06, 2020 FINDINGS: CT HEAD FINDINGS Brain: No evidence of acute infarction, hemorrhage, hydrocephalus, extra-axial collection or mass lesion/mass effect. Global parenchymal volume loss. Periventricular white matter hypodensities consistent with sequela of chronic microvascular ischemic disease. Vascular: Vascular calcifications. Skull: Normal. Negative for fracture or focal lesion. Other: None. CT MAXILLOFACIAL FINDINGS Osseous: No fracture or mandibular  dislocation. No destructive process. Well corticated ossific density of the LEFT TMJ consistent with sequela of remote prior trauma or degenerative changes, unchanged. Orbits: Negative. No traumatic or inflammatory finding. Sinuses: Unchanged mucosal thickening of the LEFT sphenoid sinus. Mild RIGHT frontal sinus mucosal thickening. Soft tissues: There is a 20 mm RIGHT thyroid nodule. CT CERVICAL SPINE FINDINGS Alignment: Unchanged 4 mm of anterolisthesis of C3-4. No acute traumatic listhesis is identified. Skull base and vertebrae: No acute fracture. No primary bone lesion or focal pathologic process. Soft tissues and spinal canal: No prevertebral fluid or swelling. No visible canal hematoma. Disc levels: Severe intervertebral disc space height loss at C5-6, C6-7, C7-T1. Moderate intervertebral disc space height loss of C4-5. Multilevel endplate proliferative changes and uncovertebral hypertrophy as well as facet arthropathy. This results in multilevel osseous neuroforaminal narrowing. No high-grade canal stenosis. Upper chest: Atherosclerotic calcifications. Other: None. IMPRESSION: 1.  No acute intracranial abnormality. 2. No acute fracture or acute traumatic subluxation of the cervical spine. 3. No acute facial bone fracture. 4. There is an 20 mm RIGHT thyroid nodule. Recommend nonemergent thyroid US (ref: J Am Coll Radiol. 2015 Feb;12(2): 143-50). Aortic Atherosclerosis (ICD10-I70.0). Electronically Signed   By: Valentino Saxon M.D.   On: 03/31/2021 12:36   CT Maxillofacial WO CM  Result Date: 03/31/2021 CLINICAL DATA:  Head trauma, minor (Age >= 65y); Neck trauma (Age >= 65y); Facial trauma EXAM: CT HEAD WITHOUT CONTRAST CT MAXILLOFACIAL WITHOUT CONTRAST CT CERVICAL SPINE WITHOUT CONTRAST TECHNIQUE: Multidetector CT imaging of the head, cervical spine, and maxillofacial structures were performed using the standard protocol without intravenous contrast. Multiplanar CT image reconstructions of the  cervical spine and maxillofacial structures were also generated. COMPARISON:  February 13, 2019, October 06, 2020 FINDINGS: CT HEAD FINDINGS Brain: No evidence of acute infarction, hemorrhage, hydrocephalus, extra-axial collection or mass lesion/mass effect. Global parenchymal volume loss. Periventricular white matter hypodensities consistent with sequela of chronic microvascular ischemic disease. Vascular: Vascular calcifications. Skull: Normal. Negative for fracture or focal lesion. Other: None. CT MAXILLOFACIAL FINDINGS Osseous: No fracture or mandibular dislocation. No destructive process. Well corticated ossific density of the LEFT TMJ consistent with sequela of remote prior trauma or degenerative changes, unchanged. Orbits: Negative. No traumatic or inflammatory finding. Sinuses: Unchanged mucosal thickening of the LEFT sphenoid sinus. Mild RIGHT frontal sinus mucosal thickening. Soft tissues: There is a 20 mm RIGHT thyroid nodule. CT CERVICAL SPINE FINDINGS Alignment: Unchanged 4 mm of anterolisthesis of C3-4. No acute traumatic listhesis is identified. Skull base and vertebrae: No acute fracture. No primary bone lesion or focal pathologic  process. Soft tissues and spinal canal: No prevertebral fluid or swelling. No visible canal hematoma. Disc levels: Severe intervertebral disc space height loss at C5-6, C6-7, C7-T1. Moderate intervertebral disc space height loss of C4-5. Multilevel endplate proliferative changes and uncovertebral hypertrophy as well as facet arthropathy. This results in multilevel osseous neuroforaminal narrowing. No high-grade canal stenosis. Upper chest: Atherosclerotic calcifications. Other: None. IMPRESSION: 1.  No acute intracranial abnormality. 2. No acute fracture or acute traumatic subluxation of the cervical spine. 3. No acute facial bone fracture. 4. There is an 20 mm RIGHT thyroid nodule. Recommend nonemergent thyroid US (ref: J Am Coll Radiol. 2015 Feb;12(2): 143-50). Aortic  Atherosclerosis (ICD10-I70.0). Electronically Signed   By: Valentino Saxon M.D.   On: 03/31/2021 12:36     All images have been reviewed by me personally.  EKG: Independently reviewed.   Assessment/Plan Principal Problem:   Syncope and collapse Active Problems:   Benign essential HTN   Type 2 diabetes mellitus with stage 3 chronic kidney disease, without long-term current use of insulin ([name])   Mixed hyperlipidemia   Fall at home, initial encounter   Leukocytosis   Thyroid nodule   Assessment Plan  #Syncope and fall POA #Mild lip mucosal bleeding from fall  Patient presented with syncope and fall at home with no prodrome symptoms.  The duration was less than 1 minute.  Denies any symptoms including no postictal confusion.  She is neurologically intact on exam.  She is admitted for syncope work-up.   -Telemetry monitoring -TSH and hemoglobin A1c -Trend troponins-negative -EKG-no ischemic changes-follow EKG tomorrow morning -Orthostatic vitals daily -Head CT-no acute changes -Echo pending -If all work-up is negative, consider to set up outpatient event monitor upon discharge.   #Leukocytosis  WBC 13.1K on admission -She is afebrile with no other complaints -We will obtain routine UA -CXR showed no acute changes -Follow-up CBC -Likely reactive from her fall   #T2DM  -Hemoglobin A1c 6.2 on 02/20/2021 -Glucose before meals and at bedtime   #HTN  -Chronic and stable  #History of CVA  -Noted  #Thyroid nodule  There is an 20 mm RIGHT thyroid nodule on CT scan.  -Outpatient follow-up for nonemergent thyroid ultrasound -TSH is pending    DVT prophylaxis: SCD  code Status: Full code Family Communication: None present Consults called: None needed Admission status: Observation  Status is: Observation  The patient remains OBS appropriate and will d/c before 2 midnights.  Dispo: The patient is from: Home              Anticipated d/c is to: Home              Patient  currently is not medically stable to d/c.   Difficult to place patient No       Time Spent: 65 minutes.  >50% of the time was devoted to discussing the patients care, assessment, plan and disposition with other care givers along with counseling the patient about the risks and benefits of treatment.    Charlann Lange MD [facility]  If 7PM-7AM, please contact night-coverage   03/31/2021, 5:53 PM

## 2021-03-31 NOTE — ED Provider Notes (Signed)
Emergency Medicine Provider Triage Evaluation Note  Anna Hoffman , a 86 y.o. female  was evaluated in triage.  Pt complains of fall.  She reports that this morning she was waking into the kitchen to get her coffee and hadn't eaten her breakfast.  She states that she then fell down.  She didn't trip, can't identify a clear reason for why she fell but speculates that "maybe because sometimes my blood pressure shoots up."  No fevers.  She has pain in her face, nose/lip, lip is bleeding, and left sided rib pain. She doesn't think she syncopized.   Review of Systems  Positive: Fall, lip injury, left rib pain Negative: Syncope, abd pain, neck pain,   Physical Exam  BP (!) 175/96 (BP Location: Left Arm)   Pulse 98   Temp 97.9 F (36.6 C) (Oral)   Resp 16   LMP  (LMP Unknown)   SpO2 97%  Gen:   Awake, no distress   Resp:  Normal effort, respirations are even and unlabored MSK:   Moves extremities without difficulty  Other:  Lip swelling and bleeding.   Medical Decision Making  Medically screening exam initiated at 11:08 AM.  Appropriate orders placed.  Hser Belanger Falero was informed that the remainder of the evaluation will be completed by another provider, this initial triage assessment does not replace that evaluation, and the importance of remaining in the ED until their evaluation is complete.  Note: Portions of this report may have been transcribed using voice recognition software. Every effort was made to ensure accuracy; however, inadvertent computerized transcription errors may be present    Lorin Glass, PA-C 03/31/21 [place], [place], DO 03/31/21 1127

## 2021-03-31 NOTE — ED Notes (Signed)
PT currently at imaging.

## 2021-04-01 ENCOUNTER — Observation Stay (HOSPITAL_BASED_OUTPATIENT_CLINIC_OR_DEPARTMENT_OTHER): Payer: Medicare Other

## 2021-04-01 ENCOUNTER — Other Ambulatory Visit: Payer: Self-pay | Admitting: Cardiology

## 2021-04-01 ENCOUNTER — Observation Stay (HOSPITAL_COMMUNITY): Payer: Medicare Other

## 2021-04-01 DIAGNOSIS — R55 Syncope and collapse: Secondary | ICD-10-CM

## 2021-04-01 DIAGNOSIS — S01511A Laceration without foreign body of lip, initial encounter: Secondary | ICD-10-CM | POA: Diagnosis not present

## 2021-04-01 LAB — ECHOCARDIOGRAM COMPLETE
Area-P 1/2: 6.17 cm2
Calc EF: 76.8 %
Height: 65.5 in
P 1/2 time: 584 msec
S' Lateral: 1.4 cm
Single Plane A2C EF: 75.4 %
Single Plane A4C EF: 77.9 %
Weight: 2144.63 oz

## 2021-04-01 LAB — CBC
HCT: 40.1 % (ref 36.0–46.0)
Hemoglobin: 12.8 g/dL (ref 12.0–15.0)
MCH: 29.2 pg (ref 26.0–34.0)
MCHC: 31.9 g/dL (ref 30.0–36.0)
MCV: 91.6 fL (ref 80.0–100.0)
Platelets: 377 10*3/uL (ref 150–400)
RBC: 4.38 MIL/uL (ref 3.87–5.11)
RDW: 15.4 % (ref 11.5–15.5)
WBC: 8.6 10*3/uL (ref 4.0–10.5)
nRBC: 0 % (ref 0.0–0.2)

## 2021-04-01 LAB — BASIC METABOLIC PANEL
Anion gap: 8 (ref 5–15)
BUN: 14 mg/dL (ref 8–23)
CO2: 24 mmol/L (ref 22–32)
Calcium: 9.4 mg/dL (ref 8.9–10.3)
Chloride: 106 mmol/L (ref 98–111)
Creatinine, Ser: 0.81 mg/dL (ref 0.44–1.00)
GFR, Estimated: >60 mL/min (ref >60)
Glucose, Bld: 107 mg/dL — ABNORMAL HIGH (ref 70–99)
Potassium: 3.7 mmol/L (ref 3.5–5.1)
Sodium: 138 mmol/L (ref 135–145)

## 2021-04-01 LAB — TSH: TSH: 0.801 u[IU]/mL (ref 0.350–4.500)

## 2021-04-01 MED ORDER — BLISTEX MEDICATED EX OINT: TOPICAL_OINTMENT | CUTANEOUS | Status: DC | PRN

## 2021-04-01 MED ORDER — LIP MEDEX EX OINT
TOPICAL_OINTMENT | CUTANEOUS | Status: DC | PRN
  Filled 2021-04-01: qty 7

## 2021-04-01 MED ORDER — HYDRALAZINE HCL 20 MG/ML IJ SOLN
10.0000 mg | INTRAMUSCULAR | Status: DC | PRN
  Administered 2021-04-01 – 2021-04-02 (×2): 10 mg via INTRAVENOUS
  Filled 2021-04-01 (×3): qty 1

## 2021-04-01 MED ORDER — ALPRAZOLAM 0.5 MG PO TABS
0.5000 mg | ORAL_TABLET | Freq: Three times a day (TID) | ORAL | Status: DC | PRN
  Administered 2021-04-01 – 2021-04-05 (×4): 0.5 mg via ORAL
  Filled 2021-04-01 (×4): qty 1

## 2021-04-01 MED ORDER — LORATADINE 10 MG PO TABS
10.0000 mg | ORAL_TABLET | Freq: Every day | ORAL | Status: DC
  Administered 2021-04-01 – 2021-04-04 (×4): 10 mg via ORAL
  Filled 2021-04-01 (×4): qty 1

## 2021-04-01 NOTE — Progress Notes (Addendum)
PROGRESS NOTE    Patient: Anna Hoffman                            PCP: Glendale Chard, MD                    DOB: 02/12/1935            DOA: 03/31/2021 [id number]:[id number]             DOS: 04/01/2021, 9:31 AM   LOS: 0 days   Date of Service: The patient was seen and examined on 04/01/2021  Subjective:   The patient was seen and examined this morning. Stable at this time. In bed comfortable has no complaints denies any chest pain shortness of breath dizziness. Otherwise no issues overnight .  Brief Narrative:   Anna Hoffman is a 86 y.o. female with medical history significant of T2DM, HTN, CVA, who presented with syncope and fall at home.  Reported a spontaneous fall and very brief loss of consciousness, less than a minute.  Sustained a upper lip bleeding.  Denied of any symptoms of visual changes, dizziness, asymmetric weaknesses.  No alteration of the her mental status including confusion post episode. Initial ED work-up negative with exception of hypertensive state at 175/76. Admitted for syncopal work-up   Assessment & Plan:   Principal Problem:   Syncope and collapse Active Problems:   Benign essential HTN   Type 2 diabetes mellitus with stage 3 chronic kidney disease, without long-term current use of insulin ([name])   Mixed hyperlipidemia   Fall at home, initial encounter   Leukocytosis   Thyroid nodule  Syncope, collapse, fall  -Status post syncopal episode at home, with a loss of consciousness less than 1 minute -No signs of head trauma, or seizures, with no confusion or dizziness reported -We will continue neurochecks -Monitoring closely on telemetry -Labs including TSH of 0.81, A1c of 6.2, UA was evaluated, all within normal limits -Troponins negative x2, no acute changes on EKG -Echo >>   pending -Bilateral carotid studies -Monitoring vitals, orthostatic blood pressure checks -CT of the head-reviewed negative for any acute changes -PT evaluation    Mild  leukocytosis -Afebrile, normotensive, no signs of SIRS or sepsis, no source of infection UA negative chest x-ray within normal limits -Likely reactive monitoring  Diabetes mellitus type 2 -Last hemoglobin A1c 6.2 on 02/20/2021 -Monitoring CBG closely, q. [facility] with SSI coverage -Currently any medications at home  Hypertension -Well-controlled, continue home medication of Norvasc  History of CVA -No focal neurological findings -Continue statin, aspirin  Thyroid nodules -Incidental finding 20 mm right thyroid nodule on CT scan, outpatient follow-up nonemergent ultrasound -TSH within normal limits  ---------------------------------------------------------------------------------------------------------------------------------------------------- Cultures; None   Antimicrobials: None    Consultants: None   --------------------------------------------------------------------------------------------------------------------------------  DVT prophylaxis:  SCD/Compression stockings Code Status:   Code Status: Full Code  Family Communication: No family member present at bedside- attempt will be made to update daily The above findings and plan of care has been discussed with patient (and family)  in detail,  they expressed understanding and agreement of above. -Advance care planning has been discussed.   Admission status:   Status is: Observation  The patient remains OBS appropriate and will d/c before 2 midnights.  Dispo: The patient is from: Home              Anticipated d/c is to: Home  Patient currently is not medically stable to d/c.   Difficult to place patient No      Level of care: Telemetry   Procedures:   No admission procedures for hospital encounter.    Antimicrobials:  Anti-infectives (From admission, onward)    None        Medication:   amLODipine  10 mg Oral Daily   rosuvastatin  20 mg Oral Daily    acetaminophen **OR**  acetaminophen, alum & mag hydroxide-simeth   Objective:   Vitals:   03/31/21 2102 03/31/21 2300 04/01/21 0400 04/01/21 0759  BP: (!) 161/94 (!) 163/99 (!) 159/95 (!) 157/92  Pulse: 100 (!) 103 92 88  Resp: 16 14 15  (!) 21  Temp: 98.5 F (36.9 C) 98.5 F (36.9 C) 97.8 F (36.6 C) 98.1 F (36.7 C)  TempSrc: Oral Oral Oral Oral  SpO2: 98% 93% 97% 94%  Weight: 60.8 kg     Height: 5' 5.5" (1.664 m)      No intake or output data in the 24 hours ending 04/01/21 0931 Filed Weights   03/31/21 2102  Weight: 60.8 kg     Examination:   Physical Exam  Constitution:  Alert, cooperative, no distress,  Appears calm and comfortable  Psychiatric: Normal and stable mood and affect, cognition intact,   HEENT: Normocephalic, PERRL, otherwise with in Normal limits  Chest:Chest symmetric Cardio vascular:  S1/S2, RRR, No murmure, No Rubs or Gallops  pulmonary: Clear to auscultation bilaterally, respirations unlabored, negative wheezes / crackles Abdomen: Soft, non-tender, non-distended, bowel sounds,no masses, no organomegaly Muscular skeletal: Limited exam - in bed, able to move all 4 extremities, Normal strength,  Neuro: CNII-XII intact. , normal motor and sensation, reflexes intact  Extremities: No pitting edema lower extremities, +2 pulses  Skin: Dry, warm to touch, negative for any Rashes, No open wounds Wounds: per nursing documentation    ------------------------------------------------------------------------------------------------------------------------------------------    LABs:  CBC Latest Ref Rng & Units 04/01/2021 03/31/2021 02/20/2021  WBC 4.0 - 10.5 K/uL 8.6 13.1(H) 7.7  Hemoglobin 12.0 - 15.0 g/dL 12.8 13.5 13.4  Hematocrit 36.0 - 46.0 % 40.1 42.5 40.6  Platelets 150 - 400 K/uL 377 401(H) 444   CMP Latest Ref Rng & Units 04/01/2021 03/31/2021 02/20/2021  Glucose 70 - 99 mg/dL 107(H) 104(H) 95  BUN 8 - 23 mg/dL 14 15 20   Creatinine 0.44 - 1.00 mg/dL 0.81 0.87 1.12(H)   Sodium 135 - 145 mmol/L 138 140 140  Potassium 3.5 - 5.1 mmol/L 3.7 4.2 3.9  Chloride 98 - 111 mmol/L 106 105 105  CO2 22 - 32 mmol/L 24 24 16(L)  Calcium 8.9 - 10.3 mg/dL 9.4 9.5 9.8  Total Protein 6.5 - 8.1 g/dL - 7.6 7.1  Total Bilirubin 0.3 - 1.2 mg/dL - 0.6 0.3  Alkaline Phos 38 - 126 U/L - 56 74  AST 15 - 41 U/L - 25 14  ALT 0 - 44 U/L - 17 9       Micro Results Recent Results (from the past 240 hour(s))  Resp Panel by RT-PCR (Flu A&B, Covid) Nasopharyngeal Swab     Status: None   Collection Time: 03/31/21  7:59 PM   Specimen: Nasopharyngeal Swab; Nasopharyngeal(NP) swabs in vial transport medium  Result Value Ref Range Status   SARS Coronavirus 2 by RT PCR NEGATIVE NEGATIVE Final    Comment: (NOTE) SARS-CoV-2 target nucleic acids are NOT DETECTED.  The SARS-CoV-2 RNA is generally detectable in upper respiratory specimens during the acute  phase of infection. The lowest concentration of SARS-CoV-2 viral copies this assay can detect is 138 copies/mL. A negative result does not preclude SARS-Cov-2 infection and should not be used as the sole basis for treatment or other patient management decisions. A negative result may occur with  improper specimen collection/handling, submission of specimen other than nasopharyngeal swab, presence of viral mutation(s) within the areas targeted by this assay, and inadequate number of viral copies(<138 copies/mL). A negative result must be combined with clinical observations, patient history, and epidemiological information. The expected result is Negative.  Fact Sheet for Patients:  EntrepreneurPulse.com.au  Fact Sheet for Healthcare Providers:  IncredibleEmployment.be  This test is no t yet approved or cleared by the [place] FDA and  has been authorized for detection and/or diagnosis of SARS-CoV-2 by FDA under an Emergency Use Authorization (EUA). This EUA will remain  in effect (meaning  this test can be used) for the duration of the COVID-19 declaration under Section 564(b)(1) of the Act, 21 U.S.C.section 360bbb-3(b)(1), unless the authorization is terminated  or revoked sooner.       Influenza A by PCR NEGATIVE NEGATIVE Final   Influenza B by PCR NEGATIVE NEGATIVE Final    Comment: (NOTE) The Xpert Xpress SARS-CoV-2/FLU/RSV plus assay is intended as an aid in the diagnosis of influenza from Nasopharyngeal swab specimens and should not be used as a sole basis for treatment. Nasal washings and aspirates are unacceptable for Xpert Xpress SARS-CoV-2/FLU/RSV testing.  Fact Sheet for Patients: EntrepreneurPulse.com.au  Fact Sheet for Healthcare Providers: IncredibleEmployment.be  This test is not yet approved or cleared by the [place] FDA and has been authorized for detection and/or diagnosis of SARS-CoV-2 by FDA under an Emergency Use Authorization (EUA). This EUA will remain in effect (meaning this test can be used) for the duration of the COVID-19 declaration under Section 564(b)(1) of the Act, 21 U.S.C. section 360bbb-3(b)(1), unless the authorization is terminated or revoked.  Performed at [facility], [place] [address], [place], [place] [address]     Radiology Reports DG Ribs Unilateral W/Chest Left  Result Date: 03/31/2021 CLINICAL DATA:  Left rib pain, fall EXAM: LEFT RIBS AND CHEST - 3+ VIEW COMPARISON:  November 07, 2017 FINDINGS: The cardiomediastinal silhouette is unchanged in contour.Tortuous thoracic aorta. Atherosclerotic calcifications. No pleural effusion. No pneumothorax. Scattered nodular opacity of the RIGHT upper lobe, likely infectious or inflammatory in etiology. Visualized abdomen is unremarkable. Remote RIGHT clavicular fracture, undulating contour of multiple LEFT-sided ribs consistent with sequela of remote fracture. Subjacent to the site of painful concern marker, no definitive acute  displaced rib fracture visualized. There is a subtle lucency along the costochondral junction which could reflect a nondisplaced fracture at the site of painful concern. IMPRESSION: 1. There is a possible nondisplaced rib fracture subjacent to the site of palpable concern. No acute displaced rib fracture visualized. There is a sequela of multiple favored remote LEFT-sided rib fractures. Electronically Signed   By: Valentino Saxon M.D.   On: 03/31/2021 12:18   CT Head Wo Contrast  Result Date: 03/31/2021 CLINICAL DATA:  Head trauma, minor (Age >= 65y); Neck trauma (Age >= 65y); Facial trauma EXAM: CT HEAD WITHOUT CONTRAST CT MAXILLOFACIAL WITHOUT CONTRAST CT CERVICAL SPINE WITHOUT CONTRAST TECHNIQUE: Multidetector CT imaging of the head, cervical spine, and maxillofacial structures were performed using the standard protocol without intravenous contrast. Multiplanar CT image reconstructions of the cervical spine and maxillofacial structures were also generated. COMPARISON:  February 13, 2019, October 06, 2020  FINDINGS: CT HEAD FINDINGS Brain: No evidence of acute infarction, hemorrhage, hydrocephalus, extra-axial collection or mass lesion/mass effect. Global parenchymal volume loss. Periventricular white matter hypodensities consistent with sequela of chronic microvascular ischemic disease. Vascular: Vascular calcifications. Skull: Normal. Negative for fracture or focal lesion. Other: None. CT MAXILLOFACIAL FINDINGS Osseous: No fracture or mandibular dislocation. No destructive process. Well corticated ossific density of the LEFT TMJ consistent with sequela of remote prior trauma or degenerative changes, unchanged. Orbits: Negative. No traumatic or inflammatory finding. Sinuses: Unchanged mucosal thickening of the LEFT sphenoid sinus. Mild RIGHT frontal sinus mucosal thickening. Soft tissues: There is a 20 mm RIGHT thyroid nodule. CT CERVICAL SPINE FINDINGS Alignment: Unchanged 4 mm of anterolisthesis of C3-4. No  acute traumatic listhesis is identified. Skull base and vertebrae: No acute fracture. No primary bone lesion or focal pathologic process. Soft tissues and spinal canal: No prevertebral fluid or swelling. No visible canal hematoma. Disc levels: Severe intervertebral disc space height loss at C5-6, C6-7, C7-T1. Moderate intervertebral disc space height loss of C4-5. Multilevel endplate proliferative changes and uncovertebral hypertrophy as well as facet arthropathy. This results in multilevel osseous neuroforaminal narrowing. No high-grade canal stenosis. Upper chest: Atherosclerotic calcifications. Other: None. IMPRESSION: 1.  No acute intracranial abnormality. 2. No acute fracture or acute traumatic subluxation of the cervical spine. 3. No acute facial bone fracture. 4. There is an 20 mm RIGHT thyroid nodule. Recommend nonemergent thyroid US (ref: J Am Coll Radiol. 2015 Feb;12(2): 143-50). Aortic Atherosclerosis (ICD10-I70.0). Electronically Signed   By: Valentino Saxon M.D.   On: 03/31/2021 12:36   CT Cervical Spine Wo Contrast  Result Date: 03/31/2021 CLINICAL DATA:  Head trauma, minor (Age >= 65y); Neck trauma (Age >= 65y); Facial trauma EXAM: CT HEAD WITHOUT CONTRAST CT MAXILLOFACIAL WITHOUT CONTRAST CT CERVICAL SPINE WITHOUT CONTRAST TECHNIQUE: Multidetector CT imaging of the head, cervical spine, and maxillofacial structures were performed using the standard protocol without intravenous contrast. Multiplanar CT image reconstructions of the cervical spine and maxillofacial structures were also generated. COMPARISON:  February 13, 2019, October 06, 2020 FINDINGS: CT HEAD FINDINGS Brain: No evidence of acute infarction, hemorrhage, hydrocephalus, extra-axial collection or mass lesion/mass effect. Global parenchymal volume loss. Periventricular white matter hypodensities consistent with sequela of chronic microvascular ischemic disease. Vascular: Vascular calcifications. Skull: Normal. Negative for fracture or  focal lesion. Other: None. CT MAXILLOFACIAL FINDINGS Osseous: No fracture or mandibular dislocation. No destructive process. Well corticated ossific density of the LEFT TMJ consistent with sequela of remote prior trauma or degenerative changes, unchanged. Orbits: Negative. No traumatic or inflammatory finding. Sinuses: Unchanged mucosal thickening of the LEFT sphenoid sinus. Mild RIGHT frontal sinus mucosal thickening. Soft tissues: There is a 20 mm RIGHT thyroid nodule. CT CERVICAL SPINE FINDINGS Alignment: Unchanged 4 mm of anterolisthesis of C3-4. No acute traumatic listhesis is identified. Skull base and vertebrae: No acute fracture. No primary bone lesion or focal pathologic process. Soft tissues and spinal canal: No prevertebral fluid or swelling. No visible canal hematoma. Disc levels: Severe intervertebral disc space height loss at C5-6, C6-7, C7-T1. Moderate intervertebral disc space height loss of C4-5. Multilevel endplate proliferative changes and uncovertebral hypertrophy as well as facet arthropathy. This results in multilevel osseous neuroforaminal narrowing. No high-grade canal stenosis. Upper chest: Atherosclerotic calcifications. Other: None. IMPRESSION: 1.  No acute intracranial abnormality. 2. No acute fracture or acute traumatic subluxation of the cervical spine. 3. No acute facial bone fracture. 4. There is an 20 mm RIGHT thyroid nodule. Recommend nonemergent thyroid US (ref:  J Am Coll Radiol. 2015 Feb;12(2): 143-50). Aortic Atherosclerosis (ICD10-I70.0). Electronically Signed   By: Valentino Saxon M.D.   On: 03/31/2021 12:36   CT Maxillofacial WO CM  Result Date: 03/31/2021 CLINICAL DATA:  Head trauma, minor (Age >= 65y); Neck trauma (Age >= 65y); Facial trauma EXAM: CT HEAD WITHOUT CONTRAST CT MAXILLOFACIAL WITHOUT CONTRAST CT CERVICAL SPINE WITHOUT CONTRAST TECHNIQUE: Multidetector CT imaging of the head, cervical spine, and maxillofacial structures were performed using the standard  protocol without intravenous contrast. Multiplanar CT image reconstructions of the cervical spine and maxillofacial structures were also generated. COMPARISON:  February 13, 2019, October 06, 2020 FINDINGS: CT HEAD FINDINGS Brain: No evidence of acute infarction, hemorrhage, hydrocephalus, extra-axial collection or mass lesion/mass effect. Global parenchymal volume loss. Periventricular white matter hypodensities consistent with sequela of chronic microvascular ischemic disease. Vascular: Vascular calcifications. Skull: Normal. Negative for fracture or focal lesion. Other: None. CT MAXILLOFACIAL FINDINGS Osseous: No fracture or mandibular dislocation. No destructive process. Well corticated ossific density of the LEFT TMJ consistent with sequela of remote prior trauma or degenerative changes, unchanged. Orbits: Negative. No traumatic or inflammatory finding. Sinuses: Unchanged mucosal thickening of the LEFT sphenoid sinus. Mild RIGHT frontal sinus mucosal thickening. Soft tissues: There is a 20 mm RIGHT thyroid nodule. CT CERVICAL SPINE FINDINGS Alignment: Unchanged 4 mm of anterolisthesis of C3-4. No acute traumatic listhesis is identified. Skull base and vertebrae: No acute fracture. No primary bone lesion or focal pathologic process. Soft tissues and spinal canal: No prevertebral fluid or swelling. No visible canal hematoma. Disc levels: Severe intervertebral disc space height loss at C5-6, C6-7, C7-T1. Moderate intervertebral disc space height loss of C4-5. Multilevel endplate proliferative changes and uncovertebral hypertrophy as well as facet arthropathy. This results in multilevel osseous neuroforaminal narrowing. No high-grade canal stenosis. Upper chest: Atherosclerotic calcifications. Other: None. IMPRESSION: 1.  No acute intracranial abnormality. 2. No acute fracture or acute traumatic subluxation of the cervical spine. 3. No acute facial bone fracture. 4. There is an 20 mm RIGHT thyroid nodule. Recommend  nonemergent thyroid US (ref: J Am Coll Radiol. 2015 Feb;12(2): 143-50). Aortic Atherosclerosis (ICD10-I70.0). Electronically Signed   By: Valentino Saxon M.D.   On: 03/31/2021 12:36    SIGNED: Deatra James, MD, FHM. [facility],  Pager (please use [email or web address] to page/text) Please use Epic Secure Chat for non-urgent communication (7AM-7PM)  If 7PM-7AM, please contact night-coverage [email or web address], 04/01/2021, 9:31 AM

## 2021-04-01 NOTE — Progress Notes (Signed)
Carotid artery duplex has been completed. Preliminary results can be found in CV Proc through chart review.   04/01/21 12:08 PM Carlos Levering RVT

## 2021-04-01 NOTE — Progress Notes (Signed)
   04/01/21 2112  Vitals  Temp 98.5 F (36.9 C)  Temp Source Oral  BP (!) 167/87  MAP (mmHg) 110  BP Location Right Arm  BP Method Automatic  Resp 18  Level of Consciousness  Level of Consciousness Alert  MEWS COLOR  MEWS Score Color Green  Orthostatic Lying   BP- Lying 167/87  Pulse- Lying 116  Orthostatic Sitting  BP- Sitting (!) 170/102 (MAP 122)  Pulse- Sitting 131  Orthostatic Standing at 0 minutes  BP- Standing at 0 minutes (!) 176/103  Pulse- Standing at 0 minutes 141  Orthostatic Standing at 3 minutes  BP- Standing at 3 minutes (!) 189/100  Pulse- Standing at 3 minutes 144  Pain Assessment  Pain Scale 0-10  Pain Score 10  Pain Type Acute pain  Pain Location Head  Pain Orientation Mid;Anterior  Pain Descriptors / Indicators Headache  Pain Frequency Constant  Pain Onset On-going  Patients Stated Pain Goal 0  Pain Intervention(s) Medication (See eMAR)  MEWS Score  MEWS Temp 0  MEWS Systolic 0  MEWS Pulse 0  MEWS RR 0  MEWS LOC 0  MEWS Score 0  Provider Notification  Provider Name/Title J.Daniels/NP  Date Provider Notified 04/01/21  Time Provider Notified 2131  Notification Type Page  Notification Reason Other (Comment) (B/P and HR still elevated)

## 2021-04-01 NOTE — Evaluation (Signed)
Physical Therapy Evaluation Patient Details Name: Anna Hoffman MRN: [id number] DOB: [birth year] Today's Date: 04/01/2021   History of Present Illness  Anna Hoffman is a 86 y.o. female with medical history significant of T2DM, HTN, CVA, who presented with syncope and fall at home.  Clinical Impression  Patient presents with decreased balance, generalized soreness and weakness.  Feels she may have tripped and fallen.  States uses cane because "her feet get tangled in walker".  But then reaching for rail and furniture in addition to the cane.  Feel she should progress well and be able to d/c home with training on walker, but will need initial 24 hour assist.  See in the past where family has been able to work it out.  Follow up HHPT recommended as well. PT to follow.    Follow Up Recommendations Home health PT;Supervision/Assistance - 24 hour    Equipment Recommendations  Rolling walker with 5" wheels (may have a walker, but reports hers has 4 wheels but no seat)    Recommendations for Other Services       Precautions / Restrictions Precautions Precautions: Fall Restrictions Weight Bearing Restrictions: No      Mobility  Bed Mobility Overal bed mobility: Needs Assistance Bed Mobility: Supine to Sit;Sit to Supine     Supine to sit: Min guard;HOB elevated Sit to supine: Supervision        Transfers Overall transfer level: Needs assistance Equipment used: Quad cane Transfers: Sit to/from Stand Sit to Stand: Min guard         General transfer comment: assist for balance, stood initially for orthostatics, then to walk, took time prior to walking to balance/orient  Ambulation/Gait Ambulation/Gait assistance: Herbalist (Feet): 90 Feet Assistive device: Quad cane (and frequent use of wall rail on L or furniture) Gait Pattern/deviations: Step-through pattern;Decreased stride length;Drifts right/left     General Gait Details: using rail also for support,  min A for balance and due to veering to find support on L UE; educated may need walker for support since needing both hands  Stairs            Wheelchair Mobility    Modified Rankin (Stroke Patients Only)       Balance Overall balance assessment: Needs assistance   Sitting balance-Leahy Scale: Good     Standing balance support: Single extremity supported Standing balance-Leahy Scale: Poor Standing balance comment: reaching for cane when rising and placing both hands on cane to get balanced prior to ambulation                             Pertinent Vitals/Pain Pain Assessment: 0-10 Pain Score: 7  Pain Location: neck and headache and L side Pain Descriptors / Indicators: Aching Pain Intervention(s): Monitored during session;Repositioned    Home Living Family/patient expects to be discharged to:: Private residence Living Arrangements: Children Available Help at Discharge: Family;Available PRN/intermittently Type of Home: House Home Access: Stairs to enter Entrance Stairs-Rails: Right;Left;Can reach both Entrance Stairs-Number of Steps: 2 Home Layout: One level Home Equipment: Shower seat;Bedside commode;Grab bars - tub/shower;Walker - 2 wheels;Cane - quad Additional Comments: son works so home alone intermittently, works night shift at group home and couple hours at [employer] during the day    Prior Function Level of Independence: Needs assistance   Gait / Transfers Assistance Needed: walks with cane but didn't have it day she passed out  ADL's / [employer]  Assistance Needed: sponge bathes and son's girlfriend helps her wash her back when she is there        Hand Dominance   Dominant Hand: Right    Extremity/Trunk Assessment        Lower Extremity Assessment Lower Extremity Assessment: Generalized weakness       Communication   Communication: HOH  Cognition Arousal/Alertness: Awake/alert Behavior During Therapy: WFL for tasks  assessed/performed Overall Cognitive Status: Within Functional Limits for tasks assessed                                        General Comments General comments (skin integrity, edema, etc.): Orthostatics taken; reports feels she may not have passed out, thinks she could have tripped on something in the kitchen. She also said her son had picked her up by the time she "came to".  Swollen top lip and reports both front teeth are loose    Exercises     Assessment/Plan    PT Assessment Patient needs continued PT services  PT Problem List Decreased strength;Decreased mobility;Decreased safety awareness;Decreased balance;Decreased knowledge of use of DME;Pain;Decreased knowledge of precautions       PT Treatment Interventions DME instruction;Therapeutic activities;Gait training;Therapeutic exercise;Patient/family education;Balance training;Stair training;Functional mobility training    PT Goals (Current goals can be found in the Care Plan section)  Acute Rehab PT Goals Patient Stated Goal: to go home PT Goal Formulation: With patient Time For Goal Achievement: 04/15/21 Potential to Achieve Goals: Good    Frequency Min 3X/week   Barriers to discharge        Co-evaluation               AM-PAC PT "6 Clicks" Mobility  Outcome Measure Help needed turning from your back to your side while in a flat bed without using bedrails?: A Little Help needed moving from lying on your back to sitting on the side of a flat bed without using bedrails?: A Little Help needed moving to and from a bed to a chair (including a wheelchair)?: A Little Help needed standing up from a chair using your arms (e.g., wheelchair or bedside chair)?: A Little Help needed to walk in hospital room?: A Little Help needed climbing 3-5 steps with a railing? : A Little 6 Click Score: 18    End of Session   Activity Tolerance: Patient tolerated treatment well Patient left: in bed;with call  bell/phone within reach;Other (comment) (tech in room for vascular study)   PT Visit Diagnosis: Other abnormalities of gait and mobility (R26.89);History of falling (Z91.81);Muscle weakness (generalized) (M62.81)    Time: 7680-8811 PT Time Calculation (min) (ACUTE ONLY): 28 min   Charges:   PT Evaluation $PT Eval Moderate Complexity: 1 Mod PT Treatments $Gait Training: 8-22 mins        Magda Kiel, PT Acute Rehabilitation Services SRPRX:[phone] Office:[phone] 04/01/2021   Reginia Naas 04/01/2021, 10:50 AM

## 2021-04-01 NOTE — Discharge Instructions (Signed)
Our office will call you to arrange a monitor for you to wear for 30 days.  It may come in the mail.  If you have not heard in next 4 days call the office [phone].  We will have you follow up with Dr. Gwenlyn Found in 5 weeks or so - they will call with date and time.

## 2021-04-01 NOTE — Progress Notes (Signed)
  Echocardiogram 2D Echocardiogram has been performed.  Anna Hoffman 04/01/2021, 8:41 AM

## 2021-04-01 NOTE — Plan of Care (Signed)
  Problem: Education: Goal: Knowledge of General Education information will improve Description: Including pain rating scale, medication(s)/side effects and non-pharmacologic comfort measures Outcome: Progressing   Problem: Clinical Measurements: Goal: Ability to maintain clinical measurements within normal limits will improve Outcome: Progressing Goal: Diagnostic test results will improve Outcome: Progressing   Problem: Activity: Goal: Risk for activity intolerance will decrease Outcome: Progressing   Problem: Coping: Goal: Level of anxiety will decrease Outcome: Progressing   Problem: Safety: Goal: Ability to remain free from injury will improve Outcome: Progressing

## 2021-04-01 NOTE — Progress Notes (Signed)
BP elevated. PRN medication and administered as ordered. Continue to monitor BP as needed.

## 2021-04-02 ENCOUNTER — Observation Stay: Payer: Medicare Other

## 2021-04-02 ENCOUNTER — Other Ambulatory Visit: Payer: Self-pay | Admitting: Cardiology

## 2021-04-02 ENCOUNTER — Encounter (HOSPITAL_COMMUNITY): Payer: Self-pay | Admitting: Internal Medicine

## 2021-04-02 DIAGNOSIS — I10 Essential (primary) hypertension: Secondary | ICD-10-CM

## 2021-04-02 DIAGNOSIS — Y92009 Unspecified place in unspecified non-institutional (private) residence as the place of occurrence of the external cause: Secondary | ICD-10-CM | POA: Diagnosis not present

## 2021-04-02 DIAGNOSIS — W19XXXA Unspecified fall, initial encounter: Secondary | ICD-10-CM | POA: Diagnosis not present

## 2021-04-02 DIAGNOSIS — R55 Syncope and collapse: Secondary | ICD-10-CM | POA: Diagnosis not present

## 2021-04-02 MED ORDER — DEXAMETHASONE SODIUM PHOSPHATE 4 MG/ML IJ SOLN
4.0000 mg | Freq: Once | INTRAMUSCULAR | Status: AC
  Administered 2021-04-02: 4 mg via INTRAVENOUS
  Filled 2021-04-02: qty 1

## 2021-04-02 NOTE — Progress Notes (Signed)
Progress Note    Anna Hoffman   UMP:[id number]  DOB: [birth year]  DOA: 03/31/2021     0  PCP: Glendale Chard, MD  Initial CC: fall/syncope  Hospital Course: [name] is an 86 yo female with PMH DMII, HTN, CVA, arthritis who presented after a syncopal fall at home.  She does not remember the episode and it was reported that the event lasted less than 1 minute.  She was found by her son on the floor after they heard her fall.  She was admitted for further workup.   Interval History:  Seen this morning.  Patient does not recall any events of her fall.  States that her son heard the fall and was present soon after and picked her up off the floor.  She denies any prior history of similar. Denies any current chest pain or shortness of breath but does state that she does get occasional chest pains when she is "working out".  ROS: Constitutional: negative for chills and fevers, Respiratory: negative for cough, Cardiovascular: negative for chest pain, and Gastrointestinal: negative for abdominal pain  Assessment & Plan: * Syncope and collapse - patient denied prior history of similar but description of event is consistent with LOC - etiology presumed cardiac with further workup required. She has a history of AV block and also reports some CP when she's exerting herself at times. Echo shows severe focal basal septal LVH - orthostatics negative and repeated; remained negative - cardiology consulted for further assistance given prior findings - tentative plan for cardiac MRI tomorrow to evaluate for HCM  Benign essential HTN - Negative orthostatics - Continue amlodipine  Thyroid nodule - Incidental right thyroid nodule noted on CT.  Measures 20 mm. -Nonemergent ultrasound recommended.  May be done outpatient  Fall at home, initial encounter - presumed from a syncopal event as she has no prodrome symptoms and no recollection - CTH, CT-C spine, and CT maxillofacial are negative for acute  abnormalities or fracture - Rib xray non-diagnostic, possible left sided rib fx; treatment still supportive care   Chronic kidney disease, stage 3a ([place]) - patient has history of CKD3a. Baseline creat ~ 1, eGFR 50  Mixed hyperlipidemia - Continue statin  Type 2 diabetes mellitus with stage 3 chronic kidney disease, without long-term current use of insulin ([name]) - last A1c 6.2% on 02/20/21  Leukocytosis-resolved as of 04/02/2021 - Considered reactive on admission - Has normalized    Old records reviewed in assessment of this patient  Antimicrobials:   DVT prophylaxis: SCDs Start: 03/31/21 1752   Code Status:   Code Status: Full Code Family Communication:   Disposition Plan: Status is: Observation  The patient will require care spanning > 2 midnights and should be moved to inpatient because: Ongoing diagnostic testing needed not appropriate for outpatient work up and Inpatient level of care appropriate due to severity of illness  Dispo: The patient is from: Home              Anticipated d/c is to: Home              Patient currently is not medically stable to d/c.   Difficult to place patient No      Risk of unplanned readmission score:     Objective: Blood pressure (!) 148/78, pulse (!) 103, temperature 98 F (36.7 C), temperature source Tympanic, resp. rate 19, height 5' 5.5" (1.664 m), weight 60.8 kg, SpO2 94 %.  Examination: General appearance: alert, cooperative, and no  distress Head: Normocephalic, without obvious abnormality, atraumatic Eyes:  EOMI Lungs: clear to auscultation bilaterally Heart: regular rate and rhythm and S1, S2 normal Abdomen: normal findings: bowel sounds normal and soft, non-tender Extremities:  no edema Skin: mobility and turgor normal Neurologic: Grossly normal  Consultants:  Cardiology  Procedures:    Data Reviewed: I have personally reviewed following labs and imaging studies No results found for this or any previous visit  (from the past 24 hour(s)).  Recent Results (from the past 240 hour(s))  Resp Panel by RT-PCR (Flu A&B, Covid) Nasopharyngeal Swab     Status: None   Collection Time: 03/31/21  7:59 PM   Specimen: Nasopharyngeal Swab; Nasopharyngeal(NP) swabs in vial transport medium  Result Value Ref Range Status   SARS Coronavirus 2 by RT PCR NEGATIVE NEGATIVE Final    Comment: (NOTE) SARS-CoV-2 target nucleic acids are NOT DETECTED.  The SARS-CoV-2 RNA is generally detectable in upper respiratory specimens during the acute phase of infection. The lowest concentration of SARS-CoV-2 viral copies this assay can detect is 138 copies/mL. A negative result does not preclude SARS-Cov-2 infection and should not be used as the sole basis for treatment or other patient management decisions. A negative result may occur with  improper specimen collection/handling, submission of specimen other than nasopharyngeal swab, presence of viral mutation(s) within the areas targeted by this assay, and inadequate number of viral copies(<138 copies/mL). A negative result must be combined with clinical observations, patient history, and epidemiological information. The expected result is Negative.  Fact Sheet for Patients:  EntrepreneurPulse.com.au  Fact Sheet for Healthcare Providers:  IncredibleEmployment.be  This test is no t yet approved or cleared by the [place] FDA and  has been authorized for detection and/or diagnosis of SARS-CoV-2 by FDA under an Emergency Use Authorization (EUA). This EUA will remain  in effect (meaning this test can be used) for the duration of the COVID-19 declaration under Section 564(b)(1) of the Act, 21 U.S.C.section 360bbb-3(b)(1), unless the authorization is terminated  or revoked sooner.       Influenza A by PCR NEGATIVE NEGATIVE Final   Influenza B by PCR NEGATIVE NEGATIVE Final    Comment: (NOTE) The Xpert Xpress SARS-CoV-2/FLU/RSV plus  assay is intended as an aid in the diagnosis of influenza from Nasopharyngeal swab specimens and should not be used as a sole basis for treatment. Nasal washings and aspirates are unacceptable for Xpert Xpress SARS-CoV-2/FLU/RSV testing.  Fact Sheet for Patients: EntrepreneurPulse.com.au  Fact Sheet for Healthcare Providers: IncredibleEmployment.be  This test is not yet approved or cleared by the [place] FDA and has been authorized for detection and/or diagnosis of SARS-CoV-2 by FDA under an Emergency Use Authorization (EUA). This EUA will remain in effect (meaning this test can be used) for the duration of the COVID-19 declaration under Section 564(b)(1) of the Act, 21 U.S.C. section 360bbb-3(b)(1), unless the authorization is terminated or revoked.  Performed at [facility], [place] [address], [place], [place] [address]      Radiology Studies: ECHOCARDIOGRAM COMPLETE  Result Date: 04/01/2021    ECHOCARDIOGRAM REPORT   Patient Name:   Anna Hoffman Date of Exam: 04/01/2021 Medical Rec #:  [id number]       Height:       65.5 in Accession #:    [id number]      Weight:       134.0 lb Date of Birth:  Feb 18, 1935        BSA:  1.678 m Patient Age:    85 years        BP:           159/95 mmHg Patient Gender: F               HR:           88 bpm. Exam Location:  Inpatient Procedure: 2D Echo, 3D Echo, Cardiac Doppler, Color Doppler and Strain Analysis Indications:    R55 Syncope  History:        Patient has prior history of Echocardiogram examinations, most                 recent 04/22/2019. Abnormal ECG, Signs/Symptoms:Syncope; Risk                 Factors:Hypertension, Dyslipidemia and Diabetes. ICH.  Sonographer:    Roseanna Rainbow RDCS Referring Phys: [id number] NA LI  Sonographer Comments: Technically difficult study due to poor echo windows. Global longitudinal strain was attempted. Patient talking and yawning throughout exam. IMPRESSIONS   1. Left ventricular ejection fraction, by estimation, is 65 to 70%. The left ventricle has hyperdynamic function. The left ventricle has no regional wall motion abnormalities. There is severe focal basal septal left ventricular hypertrophy, no LV outflow tract gradient. Left ventricular diastolic parameters are consistent with Grade I diastolic dysfunction (impaired relaxation). The average left ventricular global longitudinal strain is -20.7 %. The global longitudinal strain is normal.  2. Right ventricular systolic function is normal. The right ventricular size is normal. There is normal pulmonary artery systolic pressure. The estimated right ventricular systolic pressure is 53.6 mmHg.  3. Left atrial size was mildly dilated.  4. The mitral valve is normal in structure. Trivial mitral valve regurgitation. No evidence of mitral stenosis.  5. The aortic valve is tricuspid. Aortic valve regurgitation is mild. Mild aortic valve sclerosis is present, with no evidence of aortic valve stenosis.  6. Aortic dilatation noted. There is mild dilatation of the ascending aorta, measuring 41 mm.  7. The inferior vena cava is normal in size with greater than 50% respiratory variability, suggesting right atrial pressure of 3 mmHg. FINDINGS  Left Ventricle: Left ventricular ejection fraction, by estimation, is 65 to 70%. The left ventricle has hyperdynamic function. The left ventricle has no regional wall motion abnormalities. The average left ventricular global longitudinal strain is -20.7  %. The global longitudinal strain is normal. The left ventricular internal cavity size was normal in size. There is severe focal basal septal left ventricular hypertrophy. Left ventricular diastolic parameters are consistent with Grade I diastolic dysfunction (impaired relaxation). Right Ventricle: The right ventricular size is normal. No increase in right ventricular wall thickness. Right ventricular systolic function is normal. There is  normal pulmonary artery systolic pressure. The tricuspid regurgitant velocity is 1.87 m/s, and  with an assumed right atrial pressure of 3 mmHg, the estimated right ventricular systolic pressure is 14.4 mmHg. Left Atrium: Left atrial size was mildly dilated. Right Atrium: Right atrial size was normal in size. Pericardium: There is no evidence of pericardial effusion. Mitral Valve: The mitral valve is normal in structure. There is mild calcification of the mitral valve leaflet(s). Mild mitral annular calcification. Trivial mitral valve regurgitation. No evidence of mitral valve stenosis. Tricuspid Valve: The tricuspid valve is normal in structure. Tricuspid valve regurgitation is trivial. Aortic Valve: The aortic valve is tricuspid. Aortic valve regurgitation is mild. Aortic regurgitation PHT measures 584 msec. Mild aortic valve sclerosis is present, with no evidence  of aortic valve stenosis. Pulmonic Valve: The pulmonic valve was normal in structure. Pulmonic valve regurgitation is trivial. Aorta: The aortic root is normal in size and structure and aortic dilatation noted. There is mild dilatation of the ascending aorta, measuring 41 mm. Venous: The inferior vena cava is normal in size with greater than 50% respiratory variability, suggesting right atrial pressure of 3 mmHg. IAS/Shunts: No atrial level shunt detected by color flow Doppler.  LEFT VENTRICLE PLAX 2D LVIDd:         2.00 cm     Diastology LVIDs:         1.40 cm     LV e' medial:    3.37 cm/s LV PW:         1.30 cm     LV E/e' medial:  24.9 LV IVS:        2.30 cm     LV e' lateral:   3.59 cm/s LVOT diam:     2.00 cm     LV E/e' lateral: 23.4 LV SV:         50 LV SV Index:   30          2D Longitudinal Strain LVOT Area:     3.14 cm    2D Strain GLS Avg:     -20.7 %  LV Volumes (MOD) LV vol d, MOD A2C: 42.6 ml 3D Volume EF: LV vol d, MOD A4C: 43.7 ml 3D EF:        63 % LV vol s, MOD A2C: 10.5 ml LV EDV:       73 ml LV vol s, MOD A4C: 9.6 ml  LV ESV:        27 ml LV SV MOD A2C:     32.1 ml LV SV:        46 ml LV SV MOD A4C:     43.7 ml LV SV MOD BP:      33.2 ml RIGHT VENTRICLE            IVC RV S prime:     9.46 cm/s  IVC diam: 1.50 cm TAPSE (M-mode): 1.7 cm LEFT ATRIUM             Index       RIGHT ATRIUM           Index LA diam:        3.90 cm 2.32 cm/m  RA Area:     13.10 cm LA Vol (A2C):   26.9 ml 16.03 ml/m RA Volume:   33.50 ml  19.96 ml/m LA Vol (A4C):   45.7 ml 27.23 ml/m LA Biplane Vol: 37.8 ml 22.52 ml/m  AORTIC VALVE             PULMONIC VALVE LVOT Vmax:   93.00 cm/s  PR End Diast Vel: 1.59 msec LVOT Vmean:  59.600 cm/s LVOT VTI:    0.159 m AI PHT:      584 msec  AORTA Ao Root diam: 3.50 cm MITRAL VALVE               TRICUSPID VALVE MV Area (PHT): 6.17 cm    TR Peak grad:   14.0 mmHg MV Decel Time: 123 msec    TR Vmax:        187.00 cm/s MV E velocity: 84.00 cm/s  SHUNTS                            Systemic VTI:  0.16 m                            Systemic Diam: 2.00 cm Dalton McleanMD Electronically signed by Franki Monte Signature Date/Time: 04/01/2021/10:51:55 AM    Final    VAS US CAROTID  Result Date: 04/01/2021 Carotid Arterial Duplex Study Patient Name:  TIMIKO OFFUTT Molzahn  Date of Exam:   04/01/2021 Medical Rec #: [id number]        Accession #:    [id number] Date of Birth: [birth year]         Patient Gender: F Patient Age:   86 years Exam Location:  [facility] Procedure:      VAS US CAROTID Referring Phys: Skipper Cliche --------------------------------------------------------------------------------  Indications:       Syncope. Risk Factors:      Hypertension, hyperlipidemia, Diabetes. Comparison Study:  No prior studies. Performing Technologist: Oliver Hum RVT  Examination Guidelines: A complete evaluation includes B-mode imaging, spectral Doppler, color Doppler, and power Doppler as needed of all accessible portions of each vessel. Bilateral testing is considered an integral part of a complete  examination. Limited examinations for reoccurring indications may be performed as noted.  Right Carotid Findings: +----------+--------+--------+--------+-----------------------+--------+           PSV cm/sEDV cm/sStenosisPlaque Description     Comments +----------+--------+--------+--------+-----------------------+--------+ CCA Prox  70      16              smooth and heterogenoustortuous +----------+--------+--------+--------+-----------------------+--------+ CCA Distal46      13              smooth and heterogenous         +----------+--------+--------+--------+-----------------------+--------+ ICA Prox  50      18              smooth and heterogenous         +----------+--------+--------+--------+-----------------------+--------+ ICA Distal45      12                                     tortuous +----------+--------+--------+--------+-----------------------+--------+ ECA       81      10                                              +----------+--------+--------+--------+-----------------------+--------+ +----------+--------+-------+--------+-------------------+           PSV cm/sEDV cmsDescribeArm Pressure (mmHG) +----------+--------+-------+--------+-------------------+ [id number]                                         +----------+--------+-------+--------+-------------------+ +---------+--------+--+--------+--+---------+ VertebralPSV cm/s45EDV cm/s10Antegrade +---------+--------+--+--------+--+---------+  Left Carotid Findings: +----------+--------+--------+--------+-----------------------+--------+           PSV cm/sEDV cm/sStenosisPlaque Description     Comments +----------+--------+--------+--------+-----------------------+--------+ CCA Prox  76      15              smooth and heterogenoustortuous +----------+--------+--------+--------+-----------------------+--------+ CCA Distal44      9  smooth and heterogenous          +----------+--------+--------+--------+-----------------------+--------+ ICA Prox  39      12              smooth and heterogenoustortuous +----------+--------+--------+--------+-----------------------+--------+ ICA Distal54      19                                     tortuous +----------+--------+--------+--------+-----------------------+--------+ ECA       65      4                                               +----------+--------+--------+--------+-----------------------+--------+ +----------+--------+--------+--------+-------------------+           PSV cm/sEDV cm/sDescribeArm Pressure (mmHG) +----------+--------+--------+--------+-------------------+ [id number]                                          +----------+--------+--------+--------+-------------------+ +---------+--------+--+--------+-+---------+ VertebralPSV cm/s34EDV cm/s8Antegrade +---------+--------+--+--------+-+---------+   Summary: Right Carotid: Velocities in the right ICA are consistent with a 1-39% stenosis. Left Carotid: Velocities in the left ICA are consistent with a 1-39% stenosis. Vertebrals: Bilateral vertebral arteries demonstrate antegrade flow. *See table(s) above for measurements and observations.  Electronically signed by Antony Contras MD on 04/01/2021 at 1:36:32 PM.    Final    VAS US CAROTID  Final Result    CT Head Wo Contrast  Final Result    CT Cervical Spine Wo Contrast  Final Result    CT Maxillofacial WO CM  Final Result    DG Ribs Unilateral W/Chest Left  Final Result    MR CARDIAC MORPHOLOGY W WO CONTRAST    (Results Pending)    Scheduled Meds:  amLODipine  10 mg Oral Daily   loratadine  10 mg Oral Daily   rosuvastatin  20 mg Oral Daily   PRN Meds: acetaminophen **OR** acetaminophen, ALPRAZolam, alum & mag hydroxide-simeth, hydrALAZINE, lip balm Continuous Infusions:   LOS: 0 days  Time spent: Greater than 50% of the 35 minute visit was spent in  counseling/coordination of care for the patient as laid out in the A&P.   Dwyane Dee, MD [facility] 04/02/2021, 2:16 PM

## 2021-04-02 NOTE — Progress Notes (Signed)
   04/02/21 1956  Assess: MEWS Score  Temp 98.6 F (37 C)  BP (!) 160/95  Pulse Rate (!) 115  Resp 16  SpO2 91 %  O2 Device Room Air  Patient Activity (if Appropriate) In bed  Assess: MEWS Score  MEWS Temp 0  MEWS Systolic 0  MEWS Pulse 2  MEWS RR 0  MEWS LOC 0  MEWS Score 2  MEWS Score Color Yellow  Assess: if the MEWS score is Yellow or Red  Were vital signs taken at a resting state? Yes  Focused Assessment No change from prior assessment  Does the patient meet 2 or more of the SIRS criteria? No  MEWS guidelines implemented *See Row Information* Yes  Treat  MEWS Interventions Administered prn meds/treatments (Reassess VS in 2 hours)  Pain Scale 0-10  Pain Score 4  Pain Type Acute pain  Pain Location Head  Pain Orientation Mid;Anterior  Pain Descriptors / Indicators Headache  Pain Frequency Constant  Pain Onset On-going  Patients Stated Pain Goal 0  Pain Intervention(s) Medication (See eMAR)  Multiple Pain Sites No  Take Vital Signs  Increase Vital Sign Frequency  Yellow: Q 2hr X 2 then Q 4hr X 2, if remains yellow, continue Q 4hrs  Escalate  MEWS: Escalate Yellow: discuss with charge nurse/RN and consider discussing with provider and RRT  Notify: Charge Nurse/RN  Name of Charge Nurse/RN Notified Hilda RN  Date Charge Nurse/RN Notified 04/02/21  Time Charge Nurse/RN Notified 2030  Assess: SIRS CRITERIA  SIRS Temperature  0  SIRS Pulse 1  SIRS Respirations  0  SIRS WBC 0  SIRS Score Sum  1

## 2021-04-02 NOTE — Assessment & Plan Note (Signed)
-  patient has history of CKD3a. Baseline creat ~ 1, eGFR 50

## 2021-04-02 NOTE — Plan of Care (Signed)
  Problem: Education: °Goal: Knowledge of General Education information will improve °Description: Including pain rating scale, medication(s)/side effects and non-pharmacologic comfort measures °Outcome: Progressing °  °Problem: Clinical Measurements: °Goal: Diagnostic test results will improve °Outcome: Progressing °  °Problem: Pain Managment: °Goal: General experience of comfort will improve °Outcome: Progressing °  °Problem: Safety: °Goal: Ability to remain free from injury will improve °Outcome: Progressing °  °

## 2021-04-02 NOTE — Progress Notes (Signed)
Pt pending a cardiac MRI for severe basal septal hypertrophy, concerning for HCM.  This is only done at [facility].  I spoke with the nurse for this patient and we will plan to scan the patient at 12:00p tomorrow (04/03/21). I have asked her to call [facility] to arrange for patient to arrive by 11:30a for this test.  Pt can eat and drink per orders and take daily meds per orders.   There are no documented metal implants, please ask patient about history of claustrophobia (has PRN xanax if so)  Marchia Bond RN Navigator Cardiac Imaging [facility] [phone] Office  [phone] Cell

## 2021-04-02 NOTE — Assessment & Plan Note (Signed)
-   Considered reactive on admission - Has normalized

## 2021-04-02 NOTE — Progress Notes (Signed)
Patient became nauseated and vomiting a small amount in the floor. Patient complained of Chest Pain at that time 8/10. EKG completed. Midlevel provider notified. EKG to be reviewed. Decadron ordered and administered. Chest Pain now 4/10 per patient.

## 2021-04-02 NOTE — Assessment & Plan Note (Addendum)
-   Incidental right thyroid nodule noted on CT.  Measures 20 mm. -Nonemergent ultrasound recommended.  May be done outpatient

## 2021-04-02 NOTE — Progress Notes (Signed)
Mobility Specialist - Progress Note    04/02/21 1614  Oxygen Therapy  SpO2 97 %  O2 Device Room Air  Mobility  Activity Ambulated in hall;Transferred to/from [facility]  Level of Assistance Minimal assist, patient does 75% or more  Assistive Device Front wheel walker  Distance Ambulated (ft) 60 ft  Mobility Ambulated with assistance in hallway  Mobility Response Tolerated well  Mobility performed by Mobility specialist  $Mobility charge 1 Mobility    Pre-mobility: 106 HR, 97% SpO2 During mobility: 127 HR, 94-97%SpO2 Post-mobility: 106 HR  Pt required Min A to sit EOB and was agreeable to use RW to ambulate in the hall. Pt ambulated ~60 ft in hallway and required 1 standing rest break due to experiencing SOB and 1 LOB episode. Pt reported feeling "weaker than usual" during session and was returned to room. Nearing end of session pt reported needing to use the bathroom and was transferred to [facility]. Pt was left in recliner after session with call bell at side.  [place] Specialist Acute Rehabilitation Services Phone: [phone] 04/02/21, 4:18 PM

## 2021-04-02 NOTE — Assessment & Plan Note (Addendum)
-   Negative orthostatics - continue BP meds as above

## 2021-04-02 NOTE — Hospital Course (Addendum)
Anna Hoffman is an 86 yo female with PMH DMII, HTN, CVA, arthritis who presented after a syncopal fall at home.  She does not remember the episode and it was reported that the event lasted less than 1 minute.  She was found by her son on the floor after they heard her fall.  She was admitted for further workup.   See below for further A&P.

## 2021-04-02 NOTE — Progress Notes (Signed)
Pt schedule for cardiac MRI at [facility] Wednesday (04/03/21). Transportation arranged  with [facility] for pt pick-up at 10:30 am. Scan scheduled for 12:00 noon.

## 2021-04-02 NOTE — Assessment & Plan Note (Signed)
-   last A1c 6.2% on 02/20/21

## 2021-04-02 NOTE — Consult Note (Addendum)
Cardiology Consultation:   Patient ID: Anna Hoffman MRN: [id number]; DOB: 13-Jun-1935  Admit date: 03/31/2021 Date of Consult: 04/02/2021  PCP:  Glendale Chard, MD   [facility] Providers Cardiologist:  None        Patient Profile:   Anna Hoffman is a 86 y.o. female with a hx of dizziness, HTN, HLD, CVA in 2017, DM-2, admitted with syncope who is being seen 04/02/2021 for the evaluation of heart block at the request of Dr. Sabino Gasser.  History of Present Illness:   Anna Hoffman saw Dr. Gwenlyn Found once in 2019 for dizziness. With her CVA in 2017 her echo with EF 65-70%, no RWMA, G1DD, There was dynamic obstruction at restin the mid cavity.  Echo 04/2019 with EF 70-75% LV with hyperdynamic function.  No LVH. Impaired relaxation pattern of LV diastolic filling.  No known CAD.   Pt hospitalized in March for AKI on CKD with acute viral gastroenteritis. She did have on tele 2:1 block with HR 38 for several seconds. Mobitz 1 her lytes were normal at that time. QTc was 589 ms on EKG but may have included the P wave with 1st degree AV block.  Next day QTc 457.  Hospitalized in 4/22 and EKG with prolonged QTC but P wave and T wave are together. She had weakness and Emesis with that admit as well. Os strips saved had occ block vs. Non conducted PACs.  No symptoms noted with any of those episodes. (Her ARB was stopped with AKI and has not been resumed)  Pt presented to ER 03/31/21 after falling on way to the kitchen. She did not know why she fell. No hx of syncope. No dizziness, no vision change no urinary/bowel incontinence, no confusion.  She does not remember the fall, she remembers her son picking her up and putting in chair.  No family in room currently.  She did fall on face.    EKG:  The EKG was personally reviewed and demonstrates:  SR with 1st degree AV block PR 271 ms, chronic, and non specific T wave abnormalities laterally.  Telemetry:  Telemetry was personally reviewed and demonstrates:  SR 1st  degree AV block and possible non conducted PAC.   Na 138, K+ 3.7 BUN 14, Cr 0.81 LFTs WNL Hs troponin 6 and 5  Hgb 12.8 Plts 377 WBC on admit 13.1 now 8.6  TSH 0.801 Covid neg  Carotid dopplers 04/01/21  with 1-39% blockage bil. Echo  04/01/21 with EF 65-70%, with hyperdynamic function. No RWMA.  Severe focal basal septal LVH, no LV outflow tact gradient., G1DD, mild AI. Normal pulmonary artery systolic pressure.  Aortic dilatation 41 mm.   CT head no acute abnormality, no fx, + 20 mm Rt thyroid nodule, aortic atherosclerosis.    BP yesterday BP 167/87 P 116 lying BP sitting 170/102 P 131  BP standing 176/103 P 141 BP standing for 3 min 189/100 P 144  Today sore all over from fall.  No hx of CAD but sister and nephew with CAD.  When she walks more than usual she will develop chest pain and will sit down and it resolves.  Does not occur with riding bike or walking in house.   Past Medical History:  Diagnosis Date   Arthritis    Diabetes mellitus without complication ([place])    Hypertension    Hypokalemia    Pneumonia    Shingles    Stroke [facility])     Past Surgical History:  Procedure Laterality  Date   ABDOMINAL HYSTERECTOMY     BREAST EXCISIONAL BIOPSY Left    ESOPHAGOGASTRODUODENOSCOPY N/A 09/01/2014   Procedure: ESOPHAGOGASTRODUODENOSCOPY (EGD);  Surgeon: Lear Ng, MD;  Location: [facility];  Service: Endoscopy;  Laterality: N/A;   ESOPHAGOGASTRODUODENOSCOPY (EGD) WITH PROPOFOL N/A 10/12/2020   Procedure: ESOPHAGOGASTRODUODENOSCOPY (EGD) WITH PROPOFOL;  Surgeon: Mauri Pole, MD;  Location: [facility];  Service: Endoscopy;  Laterality: N/A;   HIATAL HERNIA REPAIR N/A 09/04/2014   Procedure: LAPAROSCOPIC REPAIR OF HIATAL HERNIA;  Surgeon: Excell Seltzer, MD;  Location: [facility];  Service: General;  Laterality: N/A;  With MESH   IR GENERIC HISTORICAL  04/10/2016   IR US GUIDE VASC ACCESS RIGHT 04/10/2016 Corrie Mckusick, DO [facility]   IR GENERIC HISTORICAL   04/10/2016   IR ANGIOGRAM SELECTIVE EACH ADDITIONAL VESSEL 04/10/2016 Corrie Mckusick, DO [facility]   IR GENERIC HISTORICAL  04/10/2016   IR ANGIOGRAM SELECTIVE EACH ADDITIONAL VESSEL 04/10/2016 Corrie Mckusick, DO [facility]   IR GENERIC HISTORICAL  04/10/2016   IR EMBO TUMOR ORGAN ISCHEMIA INFARCT INC GUIDE ROADMAPPING 04/10/2016 Corrie Mckusick, DO [facility]   IR GENERIC HISTORICAL  04/10/2016   IR ANGIOGRAM SELECTIVE EACH ADDITIONAL VESSEL 04/10/2016 Corrie Mckusick, DO [facility]   IR GENERIC HISTORICAL  04/10/2016   IR ANGIOGRAM SELECTIVE EACH ADDITIONAL VESSEL 04/10/2016 Corrie Mckusick, DO [facility]   IR GENERIC HISTORICAL  04/10/2016   IR RENAL SELECTIVE  UNI INC S&I MOD SED 04/10/2016 Corrie Mckusick, DO [facility]   IR GENERIC HISTORICAL  03/06/2016   IR RADIOLOGIST EVAL & MGMT 03/06/2016 Corrie Mckusick, DO [facility]   IR GENERIC HISTORICAL  03/26/2016   IR RADIOLOGIST EVAL & MGMT 03/26/2016 Corrie Mckusick, DO [facility]   IR GENERIC HISTORICAL  04/29/2016   IR RADIOLOGIST EVAL & MGMT 04/29/2016 [facility]   IR RADIOLOGIST EVAL & MGMT  11/12/2016   IR RADIOLOGIST EVAL & MGMT  12/16/2017   KNEE SURGERY     TONSILLECTOMY       Home Medications:  Prior to Admission medications   Medication Sig Start Date End Date Taking? Authorizing Provider  acetaminophen (TYLENOL) 325 MG tablet Take 2 tablets (650 mg total) by mouth every 6 (six) hours as needed for mild pain (or Fever >/= 101). 02/08/16  Yes Johnson, Clanford L, MD  amLODipine (NORVASC) 10 MG tablet TAKE 1 TABLET BY MOUTH ONCE DAILY Patient taking differently: Take 10 mg by mouth daily. 08/24/20 08/24/21 Yes Glendale Chard, MD  Ascorbic Acid (VITAMIN C) 1000 MG tablet Take 1,000 mg by mouth daily.   Yes [provider]  azelastine (ASTELIN) 0.1 % nasal spray Place 2 sprays into both nostrils daily as needed for rhinitis. 10/13/20  Yes Hongalgi, Lenis Dickinson, MD  Carboxymethylcellul-Glycerin (LUBRICATING EYE DROPS OP) Place 1 drop into  both eyes daily as needed (dry eyes).   Yes [provider]  Fexofenadine HCl (ALLEGRA PO) Take 180 mg by mouth at bedtime.    Yes [provider]  pantoprazole (PROTONIX) 40 MG tablet TAKE 1 TABLET BY MOUTH TWICE A DAY BEFORE MEALS Patient taking differently: Take 40 mg by mouth daily. 12/10/20  Yes Glendale Chard, MD  rosuvastatin (CRESTOR) 20 MG tablet TAKE 1 TABLET BY MOUTH ONCE DAILY Patient taking differently: Take 20 mg by mouth daily. 08/31/20 08/31/21 Yes Glendale Chard, MD  trolamine salicylate (ASPERCREME) 10 % cream Apply 1 application topically daily as needed for muscle pain.   Yes [provider]  vitamin B-12 (CYANOCOBALAMIN) 100 MCG tablet Take 100 mcg by mouth daily.   Yes [provider]  glucose blood test strip Use as directed to check blood sugars 1 time per day. 03/01/21   Glendale Chard, MD  glucose monitoring kit (FREESTYLE) monitoring kit Use as directed to check blood sugars 1 time per day dx: e11.22 03/01/21   Glendale Chard, MD  Lancets [facility] DELICA PLUS [id number]) MISC Use to check blood sugar once daily 02/07/21     sucralfate (CARAFATE) 1 GM/10ML suspension TAKE 10 MLS (1 G TOTAL) BY MOUTH 4 (FOUR) TIMES DAILY - WITH MEALS AND AT BEDTIME FOR 28 DAYS. Patient not taking: Reported on 03/31/2021 01/01/21 03/21/21  Glendale Chard, MD    Inpatient Medications: Scheduled Meds:  amLODipine  10 mg Oral Daily   loratadine  10 mg Oral Daily   rosuvastatin  20 mg Oral Daily   Continuous Infusions:  PRN Meds: acetaminophen **OR** acetaminophen, ALPRAZolam, alum & mag hydroxide-simeth, hydrALAZINE, lip balm  Allergies:    Allergies  Allergen Reactions   Amoxicillin Diarrhea    Has patient had a PCN reaction causing immediate rash, facial/tongue/throat swelling, SOB or lightheadedness with hypotension: no Has patient had a PCN reaction causing severe rash involving mucus membranes or skin necrosis: no Has patient had a PCN reaction that  required hospitalization: no pharmacist consult Has patient had a PCN reaction occurring within the last 10 years: yes If all of the above answers are "NO", then may proceed with Cephalosporin use.    Ampicillin Rash    - Tolerates Rocephin and Ancef - Remote occurrence; no symptoms of anaphylaxis or severe cutaneous reaction, and no additional medical attention required     Sulfa Antibiotics Rash    Social History:   Social History   Socioeconomic History   Marital status: Widowed    Spouse name: Not on file   Number of children: 2   Years of education: Not on file   Highest education level: Not on file  Occupational History   Occupation: retired  Tobacco Use   Smoking status: Never   Smokeless tobacco: Never  Vaping Use   Vaping Use: Never used  Substance and Sexual Activity   Alcohol use: No   Drug use: No   Sexual activity: Not Currently    Birth control/protection: Surgical  Other Topics Concern   Not on file  Social History Narrative   Son [name] lives with her   Social Determinants of Health   Financial Resource Strain: Low Risk    Difficulty of Paying Living Expenses: Not hard at all  Food Insecurity: No Food Insecurity   Worried About Charity fundraiser in the Last Year: Never true   Arboriculturist in the Last Year: Never true  Transportation Needs: No Transportation Needs   Lack of Transportation (Medical): No   Lack of Transportation (Non-Medical): No  Physical Activity: Sufficiently Active   Days of Exercise per Week: 6 days   Minutes of Exercise per Session: 30 min  Stress: No Stress Concern Present   Feeling of Stress : Not at all  Social Connections: Not on file  Intimate Partner Violence: Not on file    Family History:    Family History  Problem Relation Age of Onset   Alzheimer's disease Mother    Prostate cancer Father    Brain cancer Brother    Brain cancer Brother    Pancreatic cancer Sister    Allergic  rhinitis Neg Hx    Asthma  Neg Hx    Eczema Neg Hx    Urticaria Neg Hx      ROS:  Please see the history of present illness.  General:no colds or fevers, no weight changes Skin:no rashes or ulcers HEENT:no blurred vision, no congestion CV:see HPI PUL:see HPI GI:no diarrhea constipation or melena, no indigestion GU:no hematuria, no dysuria MS:no joint pain, no claudication Neuro:no syncope, no lightheadedness, hx of CVA Endo:+ diabetes, no thyroid disease  All other ROS reviewed and negative.     Physical Exam/Data:   Vitals:   04/01/21 2220 04/01/21 2230 04/02/21 0439 04/02/21 0914  BP: 138/79 136/80 (!) 153/77 (!) 148/78  Pulse:  (!) 107 (!) 103   Resp:  15 19   Temp:  97.6 F (36.4 C) 98 F (36.7 C)   TempSrc:  Oral Tympanic   SpO2:  94%    Weight:      Height:        Intake/Output Summary (Last 24 hours) at 04/02/2021 0951 Last data filed at 04/01/2021 2100 Gross per 24 hour  Intake 840 ml  Output --  Net 840 ml   Last 3 Weights 03/31/2021 03/21/2021 02/14/2021  Weight (lbs) 134 lb 0.6 oz 133 lb 6.4 oz 138 lb  Weight (kg) 60.8 kg 60.51 kg 62.596 kg     Body mass index is 21.97 kg/m.  General:  Well nourished, well developed, in no acute distress HEENT: normal Lymph: no adenopathy Neck: no JVD Endocrine:  No thryomegaly Vascular: No carotid bruits; FA pulses 2+ bilaterally without bruits  Cardiac:  normal S1, S2; RRR; no murmur gallup rub or click Lungs:  clear to auscultation bilaterally, no wheezing, rhonchi or rales  Abd: soft, nontender, no hepatomegaly  Ext: no edema Musculoskeletal:  No deformities, BUE and BLE strength normal and equal Skin: warm and dry  Neuro:  alert and oriented X 3 MAE follows commands, no focal abnormalities noted Psych:  Normal affect   Relevant CV Studies: 04/01/21 IMPRESSIONS     1. Left ventricular ejection fraction, by estimation, is 65 to 70%. The  left ventricle has hyperdynamic function. The left ventricle has no  regional wall motion  abnormalities. There is severe focal basal septal  left ventricular hypertrophy, no LV  outflow tract gradient. Left ventricular diastolic parameters are  consistent with Grade I diastolic dysfunction (impaired relaxation). The  average left ventricular global longitudinal strain is -20.7 %. The global  longitudinal strain is normal.   2. Right ventricular systolic function is normal. The right ventricular  size is normal. There is normal pulmonary artery systolic pressure. The  estimated right ventricular systolic pressure is 09.6 mmHg.   3. Left atrial size was mildly dilated.   4. The mitral valve is normal in structure. Trivial mitral valve  regurgitation. No evidence of mitral stenosis.   5. The aortic valve is tricuspid. Aortic valve regurgitation is mild.  Mild aortic valve sclerosis is present, with no evidence of aortic valve  stenosis.   6. Aortic dilatation noted. There is mild dilatation of the ascending  aorta, measuring 41 mm.   7. The inferior vena cava is normal in size with greater than 50%  respiratory variability, suggesting right atrial pressure of 3 mmHg.   FINDINGS   Left Ventricle: Left ventricular ejection fraction, by estimation, is 65  to 70%. The left ventricle has hyperdynamic function. The left ventricle  has no regional wall motion abnormalities. The average left  ventricular  global longitudinal strain is -20.7   %. The global longitudinal strain is normal. The left ventricular  internal cavity size was normal in size. There is severe focal basal  septal left ventricular hypertrophy. Left ventricular diastolic parameters  are consistent with Grade I diastolic  dysfunction (impaired relaxation).   Right Ventricle: The right ventricular size is normal. No increase in  right ventricular wall thickness. Right ventricular systolic function is  normal. There is normal pulmonary artery systolic pressure. The tricuspid  regurgitant velocity is 1.87 m/s, and    with an assumed right atrial pressure of 3 mmHg, the estimated right  ventricular systolic pressure is 41.6 mmHg.   Left Atrium: Left atrial size was mildly dilated.   Right Atrium: Right atrial size was normal in size.   Pericardium: There is no evidence of pericardial effusion.   Mitral Valve: The mitral valve is normal in structure. There is mild  calcification of the mitral valve leaflet(s). Mild mitral annular  calcification. Trivial mitral valve regurgitation. No evidence of mitral  valve stenosis.   Tricuspid Valve: The tricuspid valve is normal in structure. Tricuspid  valve regurgitation is trivial.   Aortic Valve: The aortic valve is tricuspid. Aortic valve regurgitation is  mild. Aortic regurgitation PHT measures 584 msec. Mild aortic valve  sclerosis is present, with no evidence of aortic valve stenosis.   Pulmonic Valve: The pulmonic valve was normal in structure. Pulmonic valve  regurgitation is trivial.   Aorta: The aortic root is normal in size and structure and aortic  dilatation noted. There is mild dilatation of the ascending aorta,  measuring 41 mm.   Venous: The inferior vena cava is normal in size with greater than 50%  respiratory variability, suggesting right atrial pressure of 3 mmHg.   IAS/Shunts: No atrial level shunt detected by color flow Doppler.       Laboratory Data:  High Sensitivity Troponin:   Recent Labs  Lab 03/31/21 1220 03/31/21 1448  TROPONINIHS 6 5     Chemistry Recent Labs  Lab 03/31/21 1220 04/01/21 0338  NA 140 138  K 4.2 3.7  CL 105 106  CO2 24 24  GLUCOSE 104* 107*  BUN 15 14  CREATININE 0.87 0.81  CALCIUM 9.5 9.4  GFRNONAA >60 >60  ANIONGAP 11 8    Recent Labs  Lab 03/31/21 1220  PROT 7.6  ALBUMIN 4.4  AST 25  ALT 17  ALKPHOS 56  BILITOT 0.6   Hematology Recent Labs  Lab 03/31/21 1220 04/01/21 0338  WBC 13.1* 8.6  RBC 4.64 4.38  HGB 13.5 12.8  HCT 42.5 40.1  MCV 91.6 91.6  MCH 29.1 29.2   MCHC 31.8 31.9  RDW 15.5 15.4  PLT 401* 377   BNPNo results for input(s): BNP, PROBNP in the last 168 hours.  DDimer No results for input(s): DDIMER in the last 168 hours.   Radiology/Studies:  DG Ribs Unilateral W/Chest Left  Result Date: 03/31/2021 CLINICAL DATA:  Left rib pain, fall EXAM: LEFT RIBS AND CHEST - 3+ VIEW COMPARISON:  November 07, 2017 FINDINGS: The cardiomediastinal silhouette is unchanged in contour.Tortuous thoracic aorta. Atherosclerotic calcifications. No pleural effusion. No pneumothorax. Scattered nodular opacity of the RIGHT upper lobe, likely infectious or inflammatory in etiology. Visualized abdomen is unremarkable. Remote RIGHT clavicular fracture, undulating contour of multiple LEFT-sided ribs consistent with sequela of remote fracture. Subjacent to the site of painful concern marker, no definitive acute displaced rib fracture visualized. There is a subtle  lucency along the costochondral junction which could reflect a nondisplaced fracture at the site of painful concern. IMPRESSION: 1. There is a possible nondisplaced rib fracture subjacent to the site of palpable concern. No acute displaced rib fracture visualized. There is a sequela of multiple favored remote LEFT-sided rib fractures. Electronically Signed   By: Valentino Saxon M.D.   On: 03/31/2021 12:18   CT Head Wo Contrast  Result Date: 03/31/2021 CLINICAL DATA:  Head trauma, minor (Age >= 65y); Neck trauma (Age >= 65y); Facial trauma EXAM: CT HEAD WITHOUT CONTRAST CT MAXILLOFACIAL WITHOUT CONTRAST CT CERVICAL SPINE WITHOUT CONTRAST TECHNIQUE: Multidetector CT imaging of the head, cervical spine, and maxillofacial structures were performed using the standard protocol without intravenous contrast. Multiplanar CT image reconstructions of the cervical spine and maxillofacial structures were also generated. COMPARISON:  February 13, 2019, October 06, 2020 FINDINGS: CT HEAD FINDINGS Brain: No evidence of acute infarction,  hemorrhage, hydrocephalus, extra-axial collection or mass lesion/mass effect. Global parenchymal volume loss. Periventricular white matter hypodensities consistent with sequela of chronic microvascular ischemic disease. Vascular: Vascular calcifications. Skull: Normal. Negative for fracture or focal lesion. Other: None. CT MAXILLOFACIAL FINDINGS Osseous: No fracture or mandibular dislocation. No destructive process. Well corticated ossific density of the LEFT TMJ consistent with sequela of remote prior trauma or degenerative changes, unchanged. Orbits: Negative. No traumatic or inflammatory finding. Sinuses: Unchanged mucosal thickening of the LEFT sphenoid sinus. Mild RIGHT frontal sinus mucosal thickening. Soft tissues: There is a 20 mm RIGHT thyroid nodule. CT CERVICAL SPINE FINDINGS Alignment: Unchanged 4 mm of anterolisthesis of C3-4. No acute traumatic listhesis is identified. Skull base and vertebrae: No acute fracture. No primary bone lesion or focal pathologic process. Soft tissues and spinal canal: No prevertebral fluid or swelling. No visible canal hematoma. Disc levels: Severe intervertebral disc space height loss at C5-6, C6-7, C7-T1. Moderate intervertebral disc space height loss of C4-5. Multilevel endplate proliferative changes and uncovertebral hypertrophy as well as facet arthropathy. This results in multilevel osseous neuroforaminal narrowing. No high-grade canal stenosis. Upper chest: Atherosclerotic calcifications. Other: None. IMPRESSION: 1.  No acute intracranial abnormality. 2. No acute fracture or acute traumatic subluxation of the cervical spine. 3. No acute facial bone fracture. 4. There is an 20 mm RIGHT thyroid nodule. Recommend nonemergent thyroid US (ref: J Am Coll Radiol. 2015 Feb;12(2): 143-50). Aortic Atherosclerosis (ICD10-I70.0). Electronically Signed   By: Valentino Saxon M.D.   On: 03/31/2021 12:36   CT Cervical Spine Wo Contrast  Result Date: 03/31/2021 CLINICAL DATA:   Head trauma, minor (Age >= 65y); Neck trauma (Age >= 65y); Facial trauma EXAM: CT HEAD WITHOUT CONTRAST CT MAXILLOFACIAL WITHOUT CONTRAST CT CERVICAL SPINE WITHOUT CONTRAST TECHNIQUE: Multidetector CT imaging of the head, cervical spine, and maxillofacial structures were performed using the standard protocol without intravenous contrast. Multiplanar CT image reconstructions of the cervical spine and maxillofacial structures were also generated. COMPARISON:  February 13, 2019, October 06, 2020 FINDINGS: CT HEAD FINDINGS Brain: No evidence of acute infarction, hemorrhage, hydrocephalus, extra-axial collection or mass lesion/mass effect. Global parenchymal volume loss. Periventricular white matter hypodensities consistent with sequela of chronic microvascular ischemic disease. Vascular: Vascular calcifications. Skull: Normal. Negative for fracture or focal lesion. Other: None. CT MAXILLOFACIAL FINDINGS Osseous: No fracture or mandibular dislocation. No destructive process. Well corticated ossific density of the LEFT TMJ consistent with sequela of remote prior trauma or degenerative changes, unchanged. Orbits: Negative. No traumatic or inflammatory finding. Sinuses: Unchanged mucosal thickening of the LEFT sphenoid sinus. Mild RIGHT frontal sinus  mucosal thickening. Soft tissues: There is a 20 mm RIGHT thyroid nodule. CT CERVICAL SPINE FINDINGS Alignment: Unchanged 4 mm of anterolisthesis of C3-4. No acute traumatic listhesis is identified. Skull base and vertebrae: No acute fracture. No primary bone lesion or focal pathologic process. Soft tissues and spinal canal: No prevertebral fluid or swelling. No visible canal hematoma. Disc levels: Severe intervertebral disc space height loss at C5-6, C6-7, C7-T1. Moderate intervertebral disc space height loss of C4-5. Multilevel endplate proliferative changes and uncovertebral hypertrophy as well as facet arthropathy. This results in multilevel osseous neuroforaminal narrowing. No  high-grade canal stenosis. Upper chest: Atherosclerotic calcifications. Other: None. IMPRESSION: 1.  No acute intracranial abnormality. 2. No acute fracture or acute traumatic subluxation of the cervical spine. 3. No acute facial bone fracture. 4. There is an 20 mm RIGHT thyroid nodule. Recommend nonemergent thyroid US (ref: J Am Coll Radiol. 2015 Feb;12(2): 143-50). Aortic Atherosclerosis (ICD10-I70.0). Electronically Signed   By: Valentino Saxon M.D.   On: 03/31/2021 12:36   ECHOCARDIOGRAM COMPLETE  Result Date: 04/01/2021    ECHOCARDIOGRAM REPORT   Patient Name:   Anna Hoffman [place] Date of Exam: 04/01/2021 Medical Rec #:  [id number]       Height:       65.5 in Accession #:    [id number]      Weight:       134.0 lb Date of Birth:  Jan 21, 1935        BSA:          1.678 m Patient Age:    27 years        BP:           159/95 mmHg Patient Gender: F               HR:           88 bpm. Exam Location:  Inpatient Procedure: 2D Echo, 3D Echo, Cardiac Doppler, Color Doppler and Strain Analysis Indications:    R55 Syncope  History:        Patient has prior history of Echocardiogram examinations, most                 recent 04/22/2019. Abnormal ECG, Signs/Symptoms:Syncope; Risk                 Factors:Hypertension, Dyslipidemia and Diabetes. ICH.  Sonographer:    Roseanna Rainbow RDCS Referring Phys: [id number] NA LI  Sonographer Comments: Technically difficult study due to poor echo windows. Global longitudinal strain was attempted. Patient talking and yawning throughout exam. IMPRESSIONS  1. Left ventricular ejection fraction, by estimation, is 65 to 70%. The left ventricle has hyperdynamic function. The left ventricle has no regional wall motion abnormalities. There is severe focal basal septal left ventricular hypertrophy, no LV outflow tract gradient. Left ventricular diastolic parameters are consistent with Grade I diastolic dysfunction (impaired relaxation). The average left ventricular global longitudinal strain is -20.7 %. The  global longitudinal strain is normal.  2. Right ventricular systolic function is normal. The right ventricular size is normal. There is normal pulmonary artery systolic pressure. The estimated right ventricular systolic pressure is 81.4 mmHg.  3. Left atrial size was mildly dilated.  4. The mitral valve is normal in structure. Trivial mitral valve regurgitation. No evidence of mitral stenosis.  5. The aortic valve is tricuspid. Aortic valve regurgitation is mild. Mild aortic valve sclerosis is present, with no evidence of aortic valve stenosis.  6. Aortic dilatation noted. There is mild dilatation of the  ascending aorta, measuring 41 mm.  7. The inferior vena cava is normal in size with greater than 50% respiratory variability, suggesting right atrial pressure of 3 mmHg. FINDINGS  Left Ventricle: Left ventricular ejection fraction, by estimation, is 65 to 70%. The left ventricle has hyperdynamic function. The left ventricle has no regional wall motion abnormalities. The average left ventricular global longitudinal strain is -20.7  %. The global longitudinal strain is normal. The left ventricular internal cavity size was normal in size. There is severe focal basal septal left ventricular hypertrophy. Left ventricular diastolic parameters are consistent with Grade I diastolic dysfunction (impaired relaxation). Right Ventricle: The right ventricular size is normal. No increase in right ventricular wall thickness. Right ventricular systolic function is normal. There is normal pulmonary artery systolic pressure. The tricuspid regurgitant velocity is 1.87 m/s, and  with an assumed right atrial pressure of 3 mmHg, the estimated right ventricular systolic pressure is 93.2 mmHg. Left Atrium: Left atrial size was mildly dilated. Right Atrium: Right atrial size was normal in size. Pericardium: There is no evidence of pericardial effusion. Mitral Valve: The mitral valve is normal in structure. There is mild calcification of the  mitral valve leaflet(s). Mild mitral annular calcification. Trivial mitral valve regurgitation. No evidence of mitral valve stenosis. Tricuspid Valve: The tricuspid valve is normal in structure. Tricuspid valve regurgitation is trivial. Aortic Valve: The aortic valve is tricuspid. Aortic valve regurgitation is mild. Aortic regurgitation PHT measures 584 msec. Mild aortic valve sclerosis is present, with no evidence of aortic valve stenosis. Pulmonic Valve: The pulmonic valve was normal in structure. Pulmonic valve regurgitation is trivial. Aorta: The aortic root is normal in size and structure and aortic dilatation noted. There is mild dilatation of the ascending aorta, measuring 41 mm. Venous: The inferior vena cava is normal in size with greater than 50% respiratory variability, suggesting right atrial pressure of 3 mmHg. IAS/Shunts: No atrial level shunt detected by color flow Doppler.  LEFT VENTRICLE PLAX 2D LVIDd:         2.00 cm     Diastology LVIDs:         1.40 cm     LV e' medial:    3.37 cm/s LV PW:         1.30 cm     LV E/e' medial:  24.9 LV IVS:        2.30 cm     LV e' lateral:   3.59 cm/s LVOT diam:     2.00 cm     LV E/e' lateral: 23.4 LV SV:         50 LV SV Index:   30          2D Longitudinal Strain LVOT Area:     3.14 cm    2D Strain GLS Avg:     -20.7 %  LV Volumes (MOD) LV vol d, MOD A2C: 42.6 ml 3D Volume EF: LV vol d, MOD A4C: 43.7 ml 3D EF:        63 % LV vol s, MOD A2C: 10.5 ml LV EDV:       73 ml LV vol s, MOD A4C: 9.6 ml  LV ESV:       27 ml LV SV MOD A2C:     32.1 ml LV SV:        46 ml LV SV MOD A4C:     43.7 ml LV SV MOD BP:      33.2 ml RIGHT VENTRICLE  IVC RV S prime:     9.46 cm/s  IVC diam: 1.50 cm TAPSE (M-mode): 1.7 cm LEFT ATRIUM             Index       RIGHT ATRIUM           Index LA diam:        3.90 cm 2.32 cm/m  RA Area:     13.10 cm LA Vol (A2C):   26.9 ml 16.03 ml/m RA Volume:   33.50 ml  19.96 ml/m LA Vol (A4C):   45.7 ml 27.23 ml/m LA Biplane Vol: 37.8 ml  22.52 ml/m  AORTIC VALVE             PULMONIC VALVE LVOT Vmax:   93.00 cm/s  PR End Diast Vel: 1.59 msec LVOT Vmean:  59.600 cm/s LVOT VTI:    0.159 m AI PHT:      584 msec  AORTA Ao Root diam: 3.50 cm MITRAL VALVE               TRICUSPID VALVE MV Area (PHT): 6.17 cm    TR Peak grad:   14.0 mmHg MV Decel Time: 123 msec    TR Vmax:        187.00 cm/s MV E velocity: 84.00 cm/s                            SHUNTS                            Systemic VTI:  0.16 m                            Systemic Diam: 2.00 cm Dalton McleanMD Electronically signed by Franki Monte Signature Date/Time: 04/01/2021/10:51:55 AM    Final    VAS US CAROTID  Result Date: 04/01/2021 Carotid Arterial Duplex Study Patient Name:  Anna Hoffman  Date of Exam:   04/01/2021 Medical Rec #: [id number]        Accession #:    [id number] Date of Birth: November 21, 1934         Patient Gender: F Patient Age:   80 years Exam Location:  [facility] Procedure:      VAS US CAROTID Referring Phys: Skipper Cliche --------------------------------------------------------------------------------  Indications:       Syncope. Risk Factors:      Hypertension, hyperlipidemia, Diabetes. Comparison Study:  No prior studies. Performing Technologist: Oliver Hum RVT  Examination Guidelines: A complete evaluation includes B-mode imaging, spectral Doppler, color Doppler, and power Doppler as needed of all accessible portions of each vessel. Bilateral testing is considered an integral part of a complete examination. Limited examinations for reoccurring indications may be performed as noted.  Right Carotid Findings: +----------+--------+--------+--------+-----------------------+--------+           PSV cm/sEDV cm/sStenosisPlaque Description     Comments +----------+--------+--------+--------+-----------------------+--------+ CCA Prox  70      16              smooth and heterogenoustortuous  +----------+--------+--------+--------+-----------------------+--------+ CCA Distal46      13              smooth and heterogenous         +----------+--------+--------+--------+-----------------------+--------+ ICA Prox  50      18  smooth and heterogenous         +----------+--------+--------+--------+-----------------------+--------+ ICA Distal45      12                                     tortuous +----------+--------+--------+--------+-----------------------+--------+ ECA       81      10                                              +----------+--------+--------+--------+-----------------------+--------+ +----------+--------+-------+--------+-------------------+           PSV cm/sEDV cmsDescribeArm Pressure (mmHG) +----------+--------+-------+--------+-------------------+ [id number]                                         +----------+--------+-------+--------+-------------------+ +---------+--------+--+--------+--+---------+ VertebralPSV cm/s45EDV cm/s10Antegrade +---------+--------+--+--------+--+---------+  Left Carotid Findings: +----------+--------+--------+--------+-----------------------+--------+           PSV cm/sEDV cm/sStenosisPlaque Description     Comments +----------+--------+--------+--------+-----------------------+--------+ CCA Prox  76      15              smooth and heterogenoustortuous +----------+--------+--------+--------+-----------------------+--------+ CCA Distal44      9               smooth and heterogenous         +----------+--------+--------+--------+-----------------------+--------+ ICA Prox  39      12              smooth and heterogenoustortuous +----------+--------+--------+--------+-----------------------+--------+ ICA Distal54      19                                     tortuous +----------+--------+--------+--------+-----------------------+--------+ ECA       65      4                                                +----------+--------+--------+--------+-----------------------+--------+ +----------+--------+--------+--------+-------------------+           PSV cm/sEDV cm/sDescribeArm Pressure (mmHG) +----------+--------+--------+--------+-------------------+ [id number]                                          +----------+--------+--------+--------+-------------------+ +---------+--------+--+--------+-+---------+ VertebralPSV cm/s34EDV cm/s8Antegrade +---------+--------+--+--------+-+---------+   Summary: Right Carotid: Velocities in the right ICA are consistent with a 1-39% stenosis. Left Carotid: Velocities in the left ICA are consistent with a 1-39% stenosis. Vertebrals: Bilateral vertebral arteries demonstrate antegrade flow. *See table(s) above for measurements and observations.  Electronically signed by Antony Contras MD on 04/01/2021 at 1:36:32 PM.    Final    CT Maxillofacial WO CM  Result Date: 03/31/2021 CLINICAL DATA:  Head trauma, minor (Age >= 65y); Neck trauma (Age >= 65y); Facial trauma EXAM: CT HEAD WITHOUT CONTRAST CT MAXILLOFACIAL WITHOUT CONTRAST CT CERVICAL SPINE WITHOUT CONTRAST TECHNIQUE: Multidetector CT imaging of the head, cervical spine, and maxillofacial structures were performed using the standard protocol without intravenous contrast. Multiplanar CT image reconstructions of the cervical spine and maxillofacial structures  were also generated. COMPARISON:  February 13, 2019, October 06, 2020 FINDINGS: CT HEAD FINDINGS Brain: No evidence of acute infarction, hemorrhage, hydrocephalus, extra-axial collection or mass lesion/mass effect. Global parenchymal volume loss. Periventricular white matter hypodensities consistent with sequela of chronic microvascular ischemic disease. Vascular: Vascular calcifications. Skull: Normal. Negative for fracture or focal lesion. Other: None. CT MAXILLOFACIAL FINDINGS Osseous: No fracture or mandibular dislocation. No  destructive process. Well corticated ossific density of the LEFT TMJ consistent with sequela of remote prior trauma or degenerative changes, unchanged. Orbits: Negative. No traumatic or inflammatory finding. Sinuses: Unchanged mucosal thickening of the LEFT sphenoid sinus. Mild RIGHT frontal sinus mucosal thickening. Soft tissues: There is a 20 mm RIGHT thyroid nodule. CT CERVICAL SPINE FINDINGS Alignment: Unchanged 4 mm of anterolisthesis of C3-4. No acute traumatic listhesis is identified. Skull base and vertebrae: No acute fracture. No primary bone lesion or focal pathologic process. Soft tissues and spinal canal: No prevertebral fluid or swelling. No visible canal hematoma. Disc levels: Severe intervertebral disc space height loss at C5-6, C6-7, C7-T1. Moderate intervertebral disc space height loss of C4-5. Multilevel endplate proliferative changes and uncovertebral hypertrophy as well as facet arthropathy. This results in multilevel osseous neuroforaminal narrowing. No high-grade canal stenosis. Upper chest: Atherosclerotic calcifications. Other: None. IMPRESSION: 1.  No acute intracranial abnormality. 2. No acute fracture or acute traumatic subluxation of the cervical spine. 3. No acute facial bone fracture. 4. There is an 20 mm RIGHT thyroid nodule. Recommend nonemergent thyroid US (ref: J Am Coll Radiol. 2015 Feb;12(2): 143-50). Aortic Atherosclerosis (ICD10-I70.0). Electronically Signed   By: Valentino Saxon M.D.   On: 03/31/2021 12:36     Assessment and Plan:   Syncope, no rhythms to cause syncope this admit, in March did have slow HR with mobitz 1 for short period of time but with AKI at that time. Would recommend zio patch live for 14 days.  Also with severe basal septal hypertrophy on echo, would recommend cardiac MRI. No orthostasis with BP but HR does increase.  BP uncontrolled. ARB stopped for AKI in combination with N,V,D.  At that time Renal noted she could resume in 4 weeks if needed.  No  known CAD. Stable angina.  Would do outpt cardiac CTA or nuc study. No angina with this episode. Only when she pushes herself with walking outside.  HTN   153/77 to 148/78 on amlodipine 10 - average hr is elevated Hyperdynamic LV function - MD to eval.    Aortic dilatation at 41 mm per echo with mild dilatation of ascending aorta.  Will need to follow yearly - possible CTA of chest to eval as well.  HLD on statin DM-2/thyroid nodule per IM    Risk Assessment/Risk Scores:        For questions or updates, please contact [place] Please consult [email or web address] for contact info under    Signed, Cecilie Kicks, NP  04/02/2021 9:51 AM  Patient seen and examined.  Agree with above documentation.  Anna Hoffman is a 86 year old female with a history of CVA in 2017, T2DM, hypertension, hyperlipidemia who we are consulted by Dr. Sabino Gasser for evaluation of syncope.  She had hospitalization in March 2022 with gastroenteritis and AKI on CKD.  Was noted to have 2-1 block intermittently on telemetry and Mobitz 1.  She currently presents to the ED 8/28 after syncopal episode.  States that she was walking to the kitchen and fell.  She does not remember what happened.  On presentation to the ED,  EKG shows normal sinus rhythm, first-degree AV block, rate 98, Q waves in V3.  Vital signs show BP 159/95, pulse 92, SPO2 97% on room air.  Labs notable for creatinine 0.87, potassium 4.2, sodium 140, normal LFTs, WBC 13.1, platelets 401, hemoglobin 13.5, troponin 6 >5, TSH 0.801.  Telemetry shows normal sinus rhythm with heart rate 100s to 110s.  Her echocardiogram yesterday shows hyperdynamic systolic function, severe basal septal hypertrophy with no LVOT gradient, normal RV function, mild aortic regurgitation, dilatation of ascending aorta measuring 41 mm.  On exam, patient is alert and oriented, tachycardic, regular, no murmurs, lungs CTAB, no LE edema or JVD.  For her syncopal episode, unclear etiology.  She reports  she may have just tripped but her lack of memory of the event and lack of prodromal symptoms suggest could have been an arrhythmia.  Would continue to monitor on telemetry and will plan live Zio patch x2 weeks on discharge.  Her echo notably shows severe basal septal hypertrophy, concerning for hypertrophic cardiomyopathy.  Recommend cardiac MRI for further evaluation.  Donato Heinz, MD

## 2021-04-02 NOTE — Assessment & Plan Note (Signed)
-   presumed from a syncopal event as she has no prodrome symptoms and no recollection - CTH, CT-C spine, and CT maxillofacial are negative for acute abnormalities or fracture - Rib xray non-diagnostic, possible left sided rib fx; treatment still supportive care

## 2021-04-02 NOTE — Assessment & Plan Note (Addendum)
-   patient denied prior history of similar but description of event is consistent with LOC - etiology presumed cardiac with further workup required. She has a history of AV block and also reports some CP when she's exerting herself at times. Echo shows severe focal basal septal LVH; cardiac MRI consistent with HCM - orthostatics negative and repeated; remained negative - cardiology consulted for further assistance given prior findings - troponin negative on 8/31

## 2021-04-02 NOTE — Assessment & Plan Note (Signed)
Continue statin. 

## 2021-04-03 ENCOUNTER — Other Ambulatory Visit: Payer: Self-pay

## 2021-04-03 ENCOUNTER — Ambulatory Visit (HOSPITAL_COMMUNITY)
Admission: RE | Admit: 2021-04-03 | Discharge: 2021-04-03 | Disposition: A | Discharge status: Home / Self Care | Payer: Medicare Other | Source: Ambulatory Visit | Attending: Cardiology | Admitting: Cardiology

## 2021-04-03 DIAGNOSIS — I77819 Aortic ectasia, unspecified site: Secondary | ICD-10-CM

## 2021-04-03 DIAGNOSIS — R112 Nausea with vomiting, unspecified: Secondary | ICD-10-CM

## 2021-04-03 DIAGNOSIS — I422 Other hypertrophic cardiomyopathy: Secondary | ICD-10-CM | POA: Insufficient documentation

## 2021-04-03 DIAGNOSIS — R000 Tachycardia, unspecified: Secondary | ICD-10-CM

## 2021-04-03 DIAGNOSIS — I700 Atherosclerosis of aorta: Secondary | ICD-10-CM | POA: Diagnosis present

## 2021-04-03 DIAGNOSIS — Z820 Family history of epilepsy and other diseases of the nervous system: Secondary | ICD-10-CM | POA: Diagnosis not present

## 2021-04-03 DIAGNOSIS — I441 Atrioventricular block, second degree: Secondary | ICD-10-CM | POA: Diagnosis not present

## 2021-04-03 DIAGNOSIS — I129 Hypertensive chronic kidney disease with stage 1 through stage 4 chronic kidney disease, or unspecified chronic kidney disease: Secondary | ICD-10-CM | POA: Diagnosis present

## 2021-04-03 DIAGNOSIS — H9190 Unspecified hearing loss, unspecified ear: Secondary | ICD-10-CM | POA: Diagnosis present

## 2021-04-03 DIAGNOSIS — E782 Mixed hyperlipidemia: Secondary | ICD-10-CM | POA: Diagnosis present

## 2021-04-03 DIAGNOSIS — Z90710 Acquired absence of both cervix and uterus: Secondary | ICD-10-CM | POA: Diagnosis not present

## 2021-04-03 DIAGNOSIS — I10 Essential (primary) hypertension: Secondary | ICD-10-CM | POA: Diagnosis not present

## 2021-04-03 DIAGNOSIS — Z8701 Personal history of pneumonia (recurrent): Secondary | ICD-10-CM | POA: Diagnosis not present

## 2021-04-03 DIAGNOSIS — Z8042 Family history of malignant neoplasm of prostate: Secondary | ICD-10-CM | POA: Diagnosis not present

## 2021-04-03 DIAGNOSIS — I77810 Thoracic aortic ectasia: Secondary | ICD-10-CM | POA: Diagnosis present

## 2021-04-03 DIAGNOSIS — N1831 Chronic kidney disease, stage 3a: Secondary | ICD-10-CM | POA: Diagnosis present

## 2021-04-03 DIAGNOSIS — Z880 Allergy status to penicillin: Secondary | ICD-10-CM | POA: Diagnosis not present

## 2021-04-03 DIAGNOSIS — Z882 Allergy status to sulfonamides status: Secondary | ICD-10-CM | POA: Diagnosis not present

## 2021-04-03 DIAGNOSIS — Z8673 Personal history of transient ischemic attack (TIA), and cerebral infarction without residual deficits: Secondary | ICD-10-CM | POA: Diagnosis not present

## 2021-04-03 DIAGNOSIS — Z79899 Other long term (current) drug therapy: Secondary | ICD-10-CM | POA: Diagnosis not present

## 2021-04-03 DIAGNOSIS — R55 Syncope and collapse: Secondary | ICD-10-CM | POA: Diagnosis present

## 2021-04-03 DIAGNOSIS — Y92009 Unspecified place in unspecified non-institutional (private) residence as the place of occurrence of the external cause: Secondary | ICD-10-CM | POA: Diagnosis not present

## 2021-04-03 DIAGNOSIS — E872 Acidosis: Secondary | ICD-10-CM | POA: Diagnosis present

## 2021-04-03 DIAGNOSIS — R5381 Other malaise: Secondary | ICD-10-CM | POA: Diagnosis not present

## 2021-04-03 DIAGNOSIS — S60221A Contusion of right hand, initial encounter: Secondary | ICD-10-CM | POA: Diagnosis present

## 2021-04-03 DIAGNOSIS — E1122 Type 2 diabetes mellitus with diabetic chronic kidney disease: Secondary | ICD-10-CM | POA: Diagnosis present

## 2021-04-03 DIAGNOSIS — Z808 Family history of malignant neoplasm of other organs or systems: Secondary | ICD-10-CM | POA: Diagnosis not present

## 2021-04-03 DIAGNOSIS — Z800 Family history of malignant neoplasm of digestive organs: Secondary | ICD-10-CM | POA: Diagnosis not present

## 2021-04-03 DIAGNOSIS — I471 Supraventricular tachycardia: Secondary | ICD-10-CM | POA: Diagnosis not present

## 2021-04-03 DIAGNOSIS — R059 Cough, unspecified: Secondary | ICD-10-CM | POA: Diagnosis not present

## 2021-04-03 DIAGNOSIS — E042 Nontoxic multinodular goiter: Secondary | ICD-10-CM | POA: Diagnosis present

## 2021-04-03 DIAGNOSIS — Z20822 Contact with and (suspected) exposure to covid-19: Secondary | ICD-10-CM | POA: Diagnosis present

## 2021-04-03 DIAGNOSIS — W19XXXA Unspecified fall, initial encounter: Secondary | ICD-10-CM | POA: Diagnosis present

## 2021-04-03 DIAGNOSIS — D72829 Elevated white blood cell count, unspecified: Secondary | ICD-10-CM | POA: Diagnosis present

## 2021-04-03 DIAGNOSIS — I421 Obstructive hypertrophic cardiomyopathy: Secondary | ICD-10-CM | POA: Diagnosis present

## 2021-04-03 LAB — GLUCOSE, CAPILLARY
Glucose-Capillary: 185 mg/dL — ABNORMAL HIGH (ref 70–99)
Glucose-Capillary: 131 mg/dL — ABNORMAL HIGH (ref 70–99)
Glucose-Capillary: 144 mg/dL — ABNORMAL HIGH (ref 70–99)

## 2021-04-03 LAB — TROPONIN I (HIGH SENSITIVITY)
Troponin I (High Sensitivity): 13 ng/L (ref <18)
Troponin I (High Sensitivity): 13 ng/L (ref <18)

## 2021-04-03 LAB — BASIC METABOLIC PANEL
Anion gap: 12 (ref 5–15)
BUN: 26 mg/dL — ABNORMAL HIGH (ref 8–23)
CO2: 20 mmol/L — ABNORMAL LOW (ref 22–32)
Calcium: 9.8 mg/dL (ref 8.9–10.3)
Chloride: 109 mmol/L (ref 98–111)
Creatinine, Ser: 1 mg/dL (ref 0.44–1.00)
GFR, Estimated: 56 mL/min — ABNORMAL LOW (ref >60)
Glucose, Bld: 158 mg/dL — ABNORMAL HIGH (ref 70–99)
Potassium: 4.3 mmol/L (ref 3.5–5.1)
Sodium: 141 mmol/L (ref 135–145)

## 2021-04-03 LAB — MAGNESIUM: Magnesium: 2.5 mg/dL — ABNORMAL HIGH (ref 1.7–2.4)

## 2021-04-03 LAB — CBC WITH DIFFERENTIAL/PLATELET
Abs Immature Granulocytes: 0.05 10*3/uL (ref 0.00–0.07)
Basophils Absolute: 0 10*3/uL (ref 0.0–0.1)
Basophils Relative: 0 %
Eosinophils Absolute: 0 10*3/uL (ref 0.0–0.5)
Eosinophils Relative: 0 %
HCT: 41.2 % (ref 36.0–46.0)
Hemoglobin: 13.5 g/dL (ref 12.0–15.0)
Immature Granulocytes: 1 %
Lymphocytes Relative: 10 %
Lymphs Abs: 0.8 10*3/uL (ref 0.7–4.0)
MCH: 29 pg (ref 26.0–34.0)
MCHC: 32.8 g/dL (ref 30.0–36.0)
MCV: 88.6 fL (ref 80.0–100.0)
Monocytes Absolute: 0.1 10*3/uL (ref 0.1–1.0)
Monocytes Relative: 1 %
Neutro Abs: 7.2 10*3/uL (ref 1.7–7.7)
Neutrophils Relative %: 88 %
Platelets: 433 10*3/uL — ABNORMAL HIGH (ref 150–400)
RBC: 4.65 MIL/uL (ref 3.87–5.11)
RDW: 15.6 % — ABNORMAL HIGH (ref 11.5–15.5)
WBC: 8.2 10*3/uL (ref 4.0–10.5)
nRBC: 0 % (ref 0.0–0.2)

## 2021-04-03 MED ORDER — DEXAMETHASONE SODIUM PHOSPHATE 4 MG/ML IJ SOLN
4.0000 mg | Freq: Once | INTRAMUSCULAR | Status: AC
  Administered 2021-04-03: 4 mg via INTRAVENOUS
  Filled 2021-04-03: qty 1

## 2021-04-03 MED ORDER — LACTATED RINGERS IV SOLN: INTRAVENOUS | Status: DC

## 2021-04-03 MED ORDER — LACTATED RINGERS IV BOLUS
1000.0000 mL | Freq: Once | INTRAVENOUS | Status: AC
  Administered 2021-04-03: 1000 mL via INTRAVENOUS

## 2021-04-03 MED ORDER — PROCHLORPERAZINE EDISYLATE 10 MG/2ML IJ SOLN
10.0000 mg | INTRAMUSCULAR | Status: DC | PRN
  Administered 2021-04-03 – 2021-04-04 (×2): 10 mg via INTRAVENOUS
  Filled 2021-04-03 (×3): qty 2

## 2021-04-03 MED ORDER — SODIUM CHLORIDE 0.9 % IV SOLN
12.5000 mg | Freq: Four times a day (QID) | INTRAVENOUS | Status: DC | PRN
  Filled 2021-04-03: qty 0.5

## 2021-04-03 MED ORDER — METOPROLOL TARTRATE 5 MG/5ML IV SOLN
2.5000 mg | Freq: Once | INTRAVENOUS | Status: AC
  Administered 2021-04-03: 2.5 mg via INTRAVENOUS
  Filled 2021-04-03: qty 5

## 2021-04-03 MED ORDER — GADOBUTROL 1 MMOL/ML IV SOLN
10.0000 mL | Freq: Once | INTRAVENOUS | Status: AC | PRN
  Administered 2021-04-03: 10 mL via INTRAVENOUS

## 2021-04-03 MED ORDER — CARVEDILOL 6.25 MG PO TABS: 6.2500 mg | ORAL_TABLET | Freq: Two times a day (BID) | ORAL | Status: DC

## 2021-04-03 NOTE — Progress Notes (Deleted)
   04/02/21 1956  Assess: MEWS Score  Temp 98.6 F (37 C)  BP (!) 160/95  Pulse Rate (!) 115  Resp 16  SpO2 91 %  O2 Device Room Air  Patient Activity (if Appropriate) In bed  Assess: MEWS Score  MEWS Temp 0  MEWS Systolic 0  MEWS Pulse 2  MEWS RR 0  MEWS LOC 0  MEWS Score 2  MEWS Score Color Yellow  Assess: if the MEWS score is Yellow or Red  Were vital signs taken at a resting state? Yes  Focused Assessment No change from prior assessment  Does the patient meet 2 or more of the SIRS criteria? No  MEWS guidelines implemented *See Row Information* Yes  Treat  MEWS Interventions Administered prn meds/treatments (Reassess VS in 2 hours)  Pain Scale 0-10  Pain Score 4  Pain Type Acute pain  Pain Location Head  Pain Orientation Mid;Anterior  Pain Descriptors / Indicators Headache  Pain Frequency Constant  Pain Onset On-going  Patients Stated Pain Goal 0  Pain Intervention(s) Medication (See eMAR)  Multiple Pain Sites No  Take Vital Signs  Increase Vital Sign Frequency  Yellow: Q 2hr X 2 then Q 4hr X 2, if remains yellow, continue Q 4hrs  Escalate  MEWS: Escalate Yellow: discuss with charge nurse/RN and consider discussing with provider and RRT  Notify: Charge Nurse/RN  Name of Charge Nurse/RN Notified Armed forces logistics/support/administrative officer  Date Charge Nurse/RN Notified 04/02/21  Time Charge Nurse/RN Notified 2030  Document  Patient Outcome Other (Comment) (Stable;continue monitoring VS)  Progress note created (see row info) Yes  Assess: SIRS CRITERIA  SIRS Temperature  0  SIRS Pulse 1  SIRS Respirations  0  SIRS WBC 0  SIRS Score Sum  1

## 2021-04-03 NOTE — Progress Notes (Addendum)
Progress Note  Patient Name: Anna Hoffman Date of Encounter: 04/03/2021  [facility] Cardiologist: None   Subjective   Reports multiple episodes of vomiting.  Currently denies any chest pain or dyspnea.  Inpatient Medications    Scheduled Meds:  amLODipine  10 mg Oral Daily   loratadine  10 mg Oral Daily   rosuvastatin  20 mg Oral Daily   Continuous Infusions:  PRN Meds: acetaminophen **OR** acetaminophen, ALPRAZolam, alum & mag hydroxide-simeth, hydrALAZINE, lip balm   Vital Signs    Vitals:   04/02/21 2225 04/03/21 0000 04/03/21 0400 04/03/21 0548  BP: (!) 149/77 (!) 155/77 (!) 144/80 (!) 143/80  Pulse:  (!) 109 (!) 115 (!) 102  Resp:  17 16 18   Temp:  98.2 F (36.8 C) 98.8 F (37.1 C) 98 F (36.7 C)  TempSrc:  Oral Oral Oral  SpO2: 94% 96% 93% 94%  Weight:      Height:        Intake/Output Summary (Last 24 hours) at 04/03/2021 1308 Last data filed at 04/03/2021 1010 Gross per 24 hour  Intake 1000 ml  Output --  Net 1000 ml   Last 3 Weights 03/31/2021 03/21/2021 02/14/2021  Weight (lbs) 134 lb 0.6 oz 133 lb 6.4 oz 138 lb  Weight (kg) 60.8 kg 60.51 kg 62.596 kg      Telemetry    Sinus tachycardia 100-120s - Personally Reviewed  ECG    No new ECG- Personally Reviewed  Physical Exam   GEN: No acute distress.   Neck: No JVD Cardiac: RRR, no murmurs, rubs, or gallops.  Respiratory: Clear to auscultation bilaterally. GI: Soft, nontender, non-distended  MS: No edema; No deformity. Neuro:  Nonfocal  Psych: Normal affect   Labs    High Sensitivity Troponin:   Recent Labs  Lab 03/31/21 1220 03/31/21 1448  TROPONINIHS 6 5      Chemistry Recent Labs  Lab 03/31/21 1220 04/01/21 0338 04/03/21 0351  NA 140 138 141  K 4.2 3.7 4.3  CL 105 106 109  CO2 24 24 20*  GLUCOSE 104* 107* 158*  BUN 15 14 26*  CREATININE 0.87 0.81 1.00  CALCIUM 9.5 9.4 9.8  PROT 7.6  --   --   ALBUMIN 4.4  --   --   AST 25  --   --   ALT 17  --   --    ALKPHOS 56  --   --   BILITOT 0.6  --   --   GFRNONAA >60 >60 56*  ANIONGAP 11 8 12      Hematology Recent Labs  Lab 03/31/21 1220 04/01/21 0338 04/03/21 0351  WBC 13.1* 8.6 8.2  RBC 4.64 4.38 4.65  HGB 13.5 12.8 13.5  HCT 42.5 40.1 41.2  MCV 91.6 91.6 88.6  MCH 29.1 29.2 29.0  MCHC 31.8 31.9 32.8  RDW 15.5 15.4 15.6*  PLT 401* 377 433*    BNPNo results for input(s): BNP, PROBNP in the last 168 hours.   DDimer No results for input(s): DDIMER in the last 168 hours.   Radiology    No results found.  Cardiac Studies   Echo 04/01/2021:  1. Left ventricular ejection fraction, by estimation, is 65 to 70%. The  left ventricle has hyperdynamic function. The left ventricle has no  regional wall motion abnormalities. There is severe focal basal septal  left ventricular hypertrophy, no LV  outflow tract gradient. Left ventricular diastolic parameters are  consistent with Grade I  diastolic dysfunction (impaired relaxation). The  average left ventricular global longitudinal strain is -20.7 %. The global  longitudinal strain is normal.   2. Right ventricular systolic function is normal. The right ventricular  size is normal. There is normal pulmonary artery systolic pressure. The  estimated right ventricular systolic pressure is 64.3 mmHg.   3. Left atrial size was mildly dilated.   4. The mitral valve is normal in structure. Trivial mitral valve  regurgitation. No evidence of mitral stenosis.   5. The aortic valve is tricuspid. Aortic valve regurgitation is mild.  Mild aortic valve sclerosis is present, with no evidence of aortic valve  stenosis.   6. Aortic dilatation noted. There is mild dilatation of the ascending  aorta, measuring 41 mm.   7. The inferior vena cava is normal in size with greater than 50%  respiratory variability, suggesting right atrial pressure of 3 mmHg.   Patient Profile     86 y.o. female with history of CVA in 2017, T2DM, hypertension,  hyperlipidemia who we are consulted by Dr. Sabino Gasser for evaluation of syncope  Assessment & Plan    Syncope: Unclear cause.  Happened shortly after position change, may be orthostatic.  Lack of prodromal symptoms concerning for possible arrhythmia.  Echo shows severe basal septal hypertrophy, concerning for HCM.  No LVOT obstruction -Monitor on telemetry, will likely plan live ZIO x2 weeks on discharge -Cardiac MRI today to evaluate for HCM, follow-up results.    Sinus tachycardia: Rate 100s to 120s.  She reports multiple episodes of vomiting over the last 2 days.  Suspect may be becoming dehydrated.  Given concern for HCM as above, will need to try and avoid dehydration, as can cause LVOT obstruction (not seen on echo 8/29).  Recommend gentle IV fluids.  EKG, troponin ordered to evaluate for atypical ACS presentation  Hypertension: On amlodipine 10 mg daily   Aortic dilatation: Ascending aorta measured 41 mm on echo, will follow  Hyperlipidemia: On rosuvastatin 20 mg daily     For questions or updates, please contact [place] Please consult [email or web address] for contact info under        Signed, Donato Heinz, MD  04/03/2021, 1:08 PM

## 2021-04-03 NOTE — Assessment & Plan Note (Addendum)
-   began overnight of 8/30 (was given 2 doses decadron IV as well); past GI notes reviewed (hx Grade C esophagitis s/p EGD 10/2020) - she responded well to BID protonix and carafate in the past; these have been resumed  -Symptoms have also improved since starting treatment

## 2021-04-03 NOTE — Progress Notes (Signed)
Physical Therapy Treatment Patient Details Name: Anna Hoffman MRN: [id number] DOB: February 18, 1935 Today's Date: 04/03/2021    History of Present Illness Anna Hoffman is a 86 y.o. female with medical history significant of T2DM, HTN, CVA, who presented with syncope and fall at home.    PT Comments    Pt in bed c/o feeling "poorly" shared that she has been vomiting and even vomited a little during her MRI earlier.  Pt also c/o posteriot HA.  Briefly assisted OOB to BSC AMB  a total of 11 feet in room.  General transfer comment: used RW this session due to instability and safety.  50% VC's on proper hand placement and safety with turns using walker throughout. General Gait Details: used RW this session due to weakness and increased gait instability with her cane.  75% VC's on proper use and safety with turns.  Unsteady.  Rec pt use walker vs cane "until stronger".  Assisted back to bed.  HR did increase from 112 to 136 with OOB activity.   Follow Up Recommendations  Home health PT;Supervision/Assistance - 24 hour     Equipment Recommendations  Rolling walker with 5" wheels    Recommendations for Other Services       Precautions / Restrictions Precautions Precautions: Fall Precaution Comments: monitor vitals    Mobility  Bed Mobility Overal bed mobility: Needs Assistance Bed Mobility: Supine to Sit;Sit to Supine     Supine to sit: Min assist Sit to supine: Min assist;Mod assist   General bed mobility comments: increased assist and time due feeling "poorly"    Transfers Overall transfer level: Needs assistance Equipment used: Rolling walker (2 wheeled) Transfers: Sit to/from [facility] Sit to Stand: Min assist Stand pivot transfers: Min assist       General transfer comment: used RW this session due to instability and safety.  50% VC's on proper hand placement and safety with turns using walker throughout.  Ambulation/Gait Ambulation/Gait assistance:  Min assist;Mod assist Gait Distance (Feet): 11 Feet Assistive device: Rolling walker (2 wheeled) Gait Pattern/deviations: Step-through pattern;Decreased stride length;Drifts right/left Gait velocity: decreased   General Gait Details: used RW this session due to weakness and increased gait instability with her cane.  75% VC's on proper use and safety with turns.   Stairs             Wheelchair Mobility    Modified Rankin (Stroke Patients Only)       Balance                                            Cognition Arousal/Alertness: Awake/alert Behavior During Therapy: WFL for tasks assessed/performed Overall Cognitive Status: Within Functional Limits for tasks assessed                                 General Comments: AxO x 3 very pleasant lady      Exercises      General Comments        Pertinent Vitals/Pain Pain Assessment: Faces Faces Pain Scale: Hurts a little bit Pain Location: HA posterior head Pain Intervention(s): Monitored during session    Home Living                      Prior Function  PT Goals (current goals can now be found in the care plan section) Progress towards PT goals: Progressing toward goals    Frequency    Min 3X/week      PT Plan Current plan remains appropriate    Co-evaluation              AM-PAC PT "6 Clicks" Mobility   Outcome Measure  Help needed turning from your back to your side while in a flat bed without using bedrails?: A Little Help needed moving from lying on your back to sitting on the side of a flat bed without using bedrails?: A Little Help needed moving to and from a bed to a chair (including a wheelchair)?: A Little Help needed standing up from a chair using your arms (e.g., wheelchair or bedside chair)?: A Little Help needed to walk in hospital room?: A Little Help needed climbing 3-5 steps with a railing? : A Lot 6 Click Score: 17    End  of Session Equipment Utilized During Treatment: Gait belt Activity Tolerance: Patient limited by fatigue;Other (comment) (HR increased to 136) Patient left: in bed;with call bell/phone within reach;Other (comment) Nurse Communication: Mobility status (pt c/o HA) PT Visit Diagnosis: Other abnormalities of gait and mobility (R26.89);History of falling (Z91.81);Muscle weakness (generalized) (M62.81)     Time: 5825-1898 PT Time Calculation (min) (ACUTE ONLY): 14 min  Charges:  $Gait Training: 8-22 mins                     {Claude Waldman  PTA Acute  Rehabilitation [employer]      [phone] Office      [phone]

## 2021-04-03 NOTE — Progress Notes (Signed)
    OVERNIGHT PROGRESS REPORT  Notified by RN for continued sinus tachy rhythm despite elimination of other factors (fever, pain, etc) patient is resting and in no obvious or stated distress with HR in the 120-130s.Sinus tach  Low dose Lopressor given    Update:  HR now SR at +/- [place] MS[facility] MSN [place]

## 2021-04-03 NOTE — Progress Notes (Addendum)
Notified provider about PT HR of 120-130 on tele. Patient afebrile and denies pain.  Awaiting orders.

## 2021-04-03 NOTE — Progress Notes (Signed)
Progress Note    Anna Hoffman   RCB:[id number]  DOB: 29-Oct-1934  DOA: 03/31/2021     0  PCP: Glendale Chard, MD  Initial CC: fall/syncope  Hospital Course: Anna Hoffman is an 86 yo female with PMH DMII, HTN, CVA, arthritis who presented after a syncopal fall at home.  She does not remember the episode and it was reported that the event lasted less than 1 minute.  She was found by her son on the floor after they heard her fall.  She was admitted for further workup.   Interval History:  Started to get nauseous overnight and had 2 episodes of vomiting; she was given decadron IV for these episodes.  Underwent cardiac MRI today. She became nauseous in MRI as well and had further vomiting episodes.   ROS: Constitutional: negative for chills and fevers, Respiratory: negative for cough, Cardiovascular: negative for chest pain, and Gastrointestinal: negative for abdominal pain  Assessment & Plan: * Syncope and collapse - patient denied prior history of similar but description of event is consistent with LOC - etiology presumed cardiac with further workup required. She has a history of AV block and also reports some CP when she's exerting herself at times. Echo shows severe focal basal septal LVH - orthostatics negative and repeated; remained negative - cardiology consulted for further assistance given prior findings - follow up cardiac MRI, performed on 8/31 - troponin negative on 8/31 - follow up repeat EKG as well   Nausea and vomiting - began overnight of 8/30 (was given 2 doses decadron IV as well); unclear precipitation at this time - negative troponin on 8/31 as well; less likely ACS related - QTC prolonged so will avoid zofran -for now start IVF and compazine/phenergan and monitor for improvement; if none will initiate further workup - if develops abd pain, will consider CT A/P as well   Benign essential HTN - Negative orthostatics - Continue amlodipine  Thyroid nodule -  Incidental right thyroid nodule noted on CT.  Measures 20 mm. -Nonemergent ultrasound recommended.  May be done outpatient  Fall at home, initial encounter - presumed from a syncopal event as she has no prodrome symptoms and no recollection - CTH, CT-C spine, and CT maxillofacial are negative for acute abnormalities or fracture - Rib xray non-diagnostic, possible left sided rib fx; treatment still supportive care   Chronic kidney disease, stage 3a ([place]) - patient has history of CKD3a. Baseline creat ~ 1, eGFR 50  Mixed hyperlipidemia - Continue statin  Type 2 diabetes mellitus with stage 3 chronic kidney disease, without long-term current use of insulin ([name]) - last A1c 6.2% on 02/20/21  Leukocytosis-resolved as of 04/02/2021 - Considered reactive on admission - Has normalized   Old records reviewed in assessment of this patient  Antimicrobials:   DVT prophylaxis: SCDs Start: 03/31/21 1752   Code Status:   Code Status: Full Code Family Communication:   Disposition Plan: Status is: Inpatient  Remains inpatient appropriate because:IV treatments appropriate due to intensity of illness or inability to take PO and Inpatient level of care appropriate due to severity of illness  Dispo: The patient is from: Home              Anticipated d/c is to: Home              Patient currently is not medically stable to d/c.   Difficult to place patient No  Risk of unplanned readmission score: Unplanned Admission- Pilot do not use: 15.86  Objective: Blood pressure (!) 176/91, pulse (!) 119, temperature 98.7 F (37.1 C), temperature source Oral, resp. rate 18, height 5' 5.5" (1.664 m), weight 60.8 kg, SpO2 97 %.  Examination: General appearance: alert, cooperative, and no distress Head: Normocephalic, without obvious abnormality, atraumatic Eyes:  EOMI Lungs: clear to auscultation bilaterally Heart: regular rate and rhythm and S1, S2 normal Abdomen: normal findings: bowel sounds  normal and soft, non-tender Extremities:  no edema Skin: mobility and turgor normal Neurologic: Grossly normal  Consultants:  Cardiology  Procedures:    Data Reviewed: I have personally reviewed following labs and imaging studies Results for orders placed or performed during the hospital encounter of 03/31/21 (from the past 24 hour(s))  Basic metabolic panel     Status: Abnormal   Collection Time: 04/03/21  3:51 AM  Result Value Ref Range   Sodium 141 135 - 145 mmol/L   Potassium 4.3 3.5 - 5.1 mmol/L   Chloride 109 98 - 111 mmol/L   CO2 20 (L) 22 - 32 mmol/L   Glucose, Bld 158 (H) 70 - 99 mg/dL   BUN 26 (H) 8 - 23 mg/dL   Creatinine, Ser 1.00 0.44 - 1.00 mg/dL   Calcium 9.8 8.9 - 10.3 mg/dL   GFR, Estimated 56 (L) >60 mL/min   Anion gap 12 5 - 15  CBC with Differential/Platelet     Status: Abnormal   Collection Time: 04/03/21  3:51 AM  Result Value Ref Range   WBC 8.2 4.0 - 10.5 K/uL   RBC 4.65 3.87 - 5.11 MIL/uL   Hemoglobin 13.5 12.0 - 15.0 g/dL   HCT 41.2 36.0 - 46.0 %   MCV 88.6 80.0 - 100.0 fL   MCH 29.0 26.0 - 34.0 pg   MCHC 32.8 30.0 - 36.0 g/dL   RDW 15.6 (H) 11.5 - 15.5 %   Platelets 433 (H) 150 - 400 K/uL   nRBC 0.0 0.0 - 0.2 %   Neutrophils Relative % 88 %   Neutro Abs 7.2 1.7 - 7.7 K/uL   Lymphocytes Relative 10 %   Lymphs Abs 0.8 0.7 - 4.0 K/uL   Monocytes Relative 1 %   Monocytes Absolute 0.1 0.1 - 1.0 K/uL   Eosinophils Relative 0 %   Eosinophils Absolute 0.0 0.0 - 0.5 K/uL   Basophils Relative 0 %   Basophils Absolute 0.0 0.0 - 0.1 K/uL   Immature Granulocytes 1 %   Abs Immature Granulocytes 0.05 0.00 - 0.07 K/uL  Magnesium     Status: Abnormal   Collection Time: 04/03/21  3:51 AM  Result Value Ref Range   Magnesium 2.5 (H) 1.7 - 2.4 mg/dL  Glucose, capillary     Status: Abnormal   Collection Time: 04/03/21  8:07 AM  Result Value Ref Range   Glucose-Capillary 185 (H) 70 - 99 mg/dL  Troponin I (High Sensitivity)     Status: None   Collection  Time: 04/03/21  2:02 PM  Result Value Ref Range   Troponin I (High Sensitivity) 13 <18 ng/L    Recent Results (from the past 240 hour(s))  Resp Panel by RT-PCR (Flu A&B, Covid) Nasopharyngeal Swab     Status: None   Collection Time: 03/31/21  7:59 PM   Specimen: Nasopharyngeal Swab; Nasopharyngeal(NP) swabs in vial transport medium  Result Value Ref Range Status   SARS Coronavirus 2 by RT PCR NEGATIVE NEGATIVE Final    Comment: (NOTE) SARS-CoV-2 target nucleic acids are NOT DETECTED.  The SARS-CoV-2 RNA is  generally detectable in upper respiratory specimens during the acute phase of infection. The lowest concentration of SARS-CoV-2 viral copies this assay can detect is 138 copies/mL. A negative result does not preclude SARS-Cov-2 infection and should not be used as the sole basis for treatment or other patient management decisions. A negative result may occur with  improper specimen collection/handling, submission of specimen other than nasopharyngeal swab, presence of viral mutation(s) within the areas targeted by this assay, and inadequate number of viral copies(<138 copies/mL). A negative result must be combined with clinical observations, patient history, and epidemiological information. The expected result is Negative.  Fact Sheet for Patients:  EntrepreneurPulse.com.au  Fact Sheet for Healthcare Providers:  IncredibleEmployment.be  This test is no t yet approved or cleared by the [place] FDA and  has been authorized for detection and/or diagnosis of SARS-CoV-2 by FDA under an Emergency Use Authorization (EUA). This EUA will remain  in effect (meaning this test can be used) for the duration of the COVID-19 declaration under Section 564(b)(1) of the Act, 21 U.S.C.section 360bbb-3(b)(1), unless the authorization is terminated  or revoked sooner.       Influenza A by PCR NEGATIVE NEGATIVE Final   Influenza B by PCR NEGATIVE  NEGATIVE Final    Comment: (NOTE) The Xpert Xpress SARS-CoV-2/FLU/RSV plus assay is intended as an aid in the diagnosis of influenza from Nasopharyngeal swab specimens and should not be used as a sole basis for treatment. Nasal washings and aspirates are unacceptable for Xpert Xpress SARS-CoV-2/FLU/RSV testing.  Fact Sheet for Patients: EntrepreneurPulse.com.au  Fact Sheet for Healthcare Providers: IncredibleEmployment.be  This test is not yet approved or cleared by the [place] FDA and has been authorized for detection and/or diagnosis of SARS-CoV-2 by FDA under an Emergency Use Authorization (EUA). This EUA will remain in effect (meaning this test can be used) for the duration of the COVID-19 declaration under Section 564(b)(1) of the Act, 21 U.S.C. section 360bbb-3(b)(1), unless the authorization is terminated or revoked.  Performed at [facility], [place] [address], [place], [place] [address]      Radiology Studies: No results found. VAS US CAROTID  Final Result    CT Head Wo Contrast  Final Result    CT Cervical Spine Wo Contrast  Final Result    CT Maxillofacial WO CM  Final Result    DG Ribs Unilateral W/Chest Left  Final Result    MR CARDIAC MORPHOLOGY W WO CONTRAST    (Results Pending)    Scheduled Meds:  amLODipine  10 mg Oral Daily   loratadine  10 mg Oral Daily   rosuvastatin  20 mg Oral Daily   PRN Meds: acetaminophen **OR** acetaminophen, ALPRAZolam, alum & mag hydroxide-simeth, hydrALAZINE, lip balm, prochlorperazine, promethazine (PHENERGAN) injection (IM or IVPB) Continuous Infusions:  lactated ringers     lactated ringers 100 mL/hr at 04/03/21 1444   promethazine (PHENERGAN) injection (IM or IVPB)       LOS: 0 days  Time spent: Greater than 50% of the 35 minute visit was spent in counseling/coordination of care for the patient as laid out in the A&P.   Dwyane Dee, MD [facility] 04/03/2021, 3:35 PM

## 2021-04-03 NOTE — Progress Notes (Signed)
Patient having second episode of vomiting. Provider made aware. Awaiting orders.

## 2021-04-04 DIAGNOSIS — I10 Essential (primary) hypertension: Secondary | ICD-10-CM | POA: Diagnosis not present

## 2021-04-04 DIAGNOSIS — R112 Nausea with vomiting, unspecified: Secondary | ICD-10-CM

## 2021-04-04 DIAGNOSIS — R5381 Other malaise: Secondary | ICD-10-CM

## 2021-04-04 DIAGNOSIS — I422 Other hypertrophic cardiomyopathy: Secondary | ICD-10-CM

## 2021-04-04 DIAGNOSIS — R55 Syncope and collapse: Secondary | ICD-10-CM | POA: Diagnosis not present

## 2021-04-04 DIAGNOSIS — I421 Obstructive hypertrophic cardiomyopathy: Secondary | ICD-10-CM

## 2021-04-04 LAB — MAGNESIUM: Magnesium: 2 mg/dL (ref 1.7–2.4)

## 2021-04-04 LAB — CBC WITH DIFFERENTIAL/PLATELET
Abs Immature Granulocytes: 0.08 10*3/uL — ABNORMAL HIGH (ref 0.00–0.07)
Basophils Absolute: 0 10*3/uL (ref 0.0–0.1)
Basophils Relative: 0 %
Eosinophils Absolute: 0 10*3/uL (ref 0.0–0.5)
Eosinophils Relative: 0 %
HCT: 37.8 % (ref 36.0–46.0)
Hemoglobin: 12.3 g/dL (ref 12.0–15.0)
Immature Granulocytes: 1 %
Lymphocytes Relative: 15 %
Lymphs Abs: 2 10*3/uL (ref 0.7–4.0)
MCH: 28.9 pg (ref 26.0–34.0)
MCHC: 32.5 g/dL (ref 30.0–36.0)
MCV: 88.9 fL (ref 80.0–100.0)
Monocytes Absolute: 1.1 10*3/uL — ABNORMAL HIGH (ref 0.1–1.0)
Monocytes Relative: 8 %
Neutro Abs: 9.8 10*3/uL — ABNORMAL HIGH (ref 1.7–7.7)
Neutrophils Relative %: 76 %
Platelets: 401 10*3/uL — ABNORMAL HIGH (ref 150–400)
RBC: 4.25 MIL/uL (ref 3.87–5.11)
RDW: 15.9 % — ABNORMAL HIGH (ref 11.5–15.5)
WBC: 13 10*3/uL — ABNORMAL HIGH (ref 4.0–10.5)
nRBC: 0 % (ref 0.0–0.2)

## 2021-04-04 LAB — BASIC METABOLIC PANEL
Anion gap: 9 (ref 5–15)
BUN: 25 mg/dL — ABNORMAL HIGH (ref 8–23)
CO2: 20 mmol/L — ABNORMAL LOW (ref 22–32)
Calcium: 9.3 mg/dL (ref 8.9–10.3)
Chloride: 107 mmol/L (ref 98–111)
Creatinine, Ser: 0.78 mg/dL (ref 0.44–1.00)
GFR, Estimated: >60 mL/min (ref >60)
Glucose, Bld: 127 mg/dL — ABNORMAL HIGH (ref 70–99)
Potassium: 3.8 mmol/L (ref 3.5–5.1)
Sodium: 136 mmol/L (ref 135–145)

## 2021-04-04 LAB — GLUCOSE, CAPILLARY: Glucose-Capillary: 119 mg/dL — ABNORMAL HIGH (ref 70–99)

## 2021-04-04 MED ORDER — PANTOPRAZOLE SODIUM 40 MG PO TBEC
40.0000 mg | DELAYED_RELEASE_TABLET | Freq: Two times a day (BID) | ORAL | Status: DC
  Administered 2021-04-04 – 2021-04-09 (×11): 40 mg via ORAL
  Filled 2021-04-04 (×11): qty 1

## 2021-04-04 MED ORDER — CARVEDILOL 6.25 MG PO TABS
6.2500 mg | ORAL_TABLET | Freq: Two times a day (BID) | ORAL | Status: DC
  Administered 2021-04-04 – 2021-04-05 (×2): 6.25 mg via ORAL
  Filled 2021-04-04 (×3): qty 1

## 2021-04-04 MED ORDER — SUCRALFATE 1 GM/10ML PO SUSP
1.0000 g | Freq: Three times a day (TID) | ORAL | Status: DC
  Administered 2021-04-04 – 2021-04-09 (×21): 1 g via ORAL
  Filled 2021-04-04 (×21): qty 10

## 2021-04-04 MED ORDER — NON FORMULARY: 180.0000 mg | Freq: Every day | Status: DC

## 2021-04-04 MED ORDER — FEXOFENADINE HCL 180 MG PO TABS
180.0000 mg | ORAL_TABLET | Freq: Every day | ORAL | Status: DC
  Administered 2021-04-04 – 2021-04-09 (×6): 180 mg via ORAL
  Filled 2021-04-04 (×7): qty 1

## 2021-04-04 MED ORDER — PANTOPRAZOLE SODIUM 40 MG PO TBEC: 40.0000 mg | DELAYED_RELEASE_TABLET | Freq: Every day | ORAL | Status: DC

## 2021-04-04 NOTE — Assessment & Plan Note (Addendum)
-   confirmed on cardiac MRI - possible contribution to syncope if she was also dehydrated - vasovagal/syncopal/bradycardic event on 9/4 in the morning; cardiology notified and meds adjusted; event is considered due to her narrow outflow tract in conjunction with presumed straining on the commode - coreg transitioned to metoprolol  - d/c'd amlodipine per cardiology - Zio patch to be mailed  - s/p IVF; will continue encouraging hydration - if BP does elevate agaion can consider addition of spironolactone (12.5 mg daily) vs transition back to coreg - outpatient follow up with cardiology

## 2021-04-04 NOTE — Assessment & Plan Note (Addendum)
-   patient appears to be deconditioned at this point - PT eval; recommended SNF, patient agreeable

## 2021-04-04 NOTE — Progress Notes (Signed)
Mobility Specialist - Refusal    Pt refused mobility at this time due to feeling fatigued and wanting to rest.   [place] Specialist Acute Rehabilitation Services Phone: [phone] 04/04/21, 2:27 PM

## 2021-04-04 NOTE — Progress Notes (Addendum)
Progress Note  Patient Name: Anna Hoffman Date of Encounter: 04/05/2021  [facility] Cardiologist: None   Subjective   Eating breakfast, doing a little better on GI meds, coughs frequently  Inpatient Medications    Scheduled Meds:  amLODipine  10 mg Oral Daily   carvedilol  9.375 mg Oral BID WC   fexofenadine  180 mg Oral Daily   pantoprazole  40 mg Oral BID   rosuvastatin  20 mg Oral Daily   sucralfate  1 g Oral TID WC & HS   Continuous Infusions:  lactated ringers 75 mL/hr at 04/05/21 0241   promethazine (PHENERGAN) injection (IM or IVPB)     PRN Meds: acetaminophen **OR** acetaminophen, ALPRAZolam, alum & mag hydroxide-simeth, hydrALAZINE, lip balm, prochlorperazine, promethazine (PHENERGAN) injection (IM or IVPB)   Vital Signs    Vitals:   04/04/21 2023 04/05/21 0549 04/05/21 0612 04/05/21 0820  BP: (!) 141/86 (!) 151/87 (!) 171/94 (!) 154/84  Pulse: 96 99 (!) 104 88  Resp: 15 14    Temp: 99.3 F (37.4 C)     TempSrc: Oral     SpO2: 94% 96%    Weight:      Height:        Intake/Output Summary (Last 24 hours) at 04/05/2021 1025 Last data filed at 04/05/2021 0355 Gross per 24 hour  Intake 2644.57 ml  Output 551 ml  Net 2093.57 ml   Last 3 Weights 03/31/2021 03/21/2021 02/14/2021  Weight (lbs) 134 lb 0.6 oz 133 lb 6.4 oz 138 lb  Weight (kg) 60.8 kg 60.51 kg 62.596 kg    Orthostatic VS 08/30 Position BP HR  Lying 170/99 109  Sitting 153/97 115  Standing 166/103 125  Standing at 3" 180/110 128        Telemetry    SR, PACs, some non-conducted - Personally Reviewed  ECG    No new ECG- Personally Reviewed  Physical Exam   General: Well developed, well nourished, female in no acute distress Head: Eyes PERRLA, Head normocephalic and atraumatic Lungs: few rales bases bilaterally to auscultation. Heart: HRRR S1 S2, without rub or gallop. soft murmur. 4/4 extremity pulses are 2+ & equal. No JVD. Abdomen: Bowel sounds are present, abdomen soft and  non-tender without masses or  hernias noted. Msk: weak strength and tone for age. Extremities: No clubbing, cyanosis or edema.    Skin:  No rashes or lesions noted. Neuro: Alert and oriented X 3. Psych:  Good affect, responds appropriately  Labs    High Sensitivity Troponin:   Recent Labs  Lab 03/31/21 1220 03/31/21 1448 04/03/21 1402 04/03/21 1544  TROPONINIHS 6 5 13 13       Chemistry Recent Labs  Lab 03/31/21 1220 04/01/21 0338 04/03/21 0351 04/04/21 0355 04/05/21 0352  NA 140   < > 141 136 138  K 4.2   < > 4.3 3.8 3.5  CL 105   < > 109 107 107  CO2 24   < > 20* 20* 25  GLUCOSE 104*   < > 158* 127* 110*  BUN 15   < > 26* 25* 21  CREATININE 0.87   < > 1.00 0.78 0.88  CALCIUM 9.5   < > 9.8 9.3 8.7*  PROT 7.6  --   --   --   --   ALBUMIN 4.4  --   --   --   --   AST 25  --   --   --   --  ALT 17  --   --   --   --   ALKPHOS 56  --   --   --   --   BILITOT 0.6  --   --   --   --   GFRNONAA >60   < > 56* >60 >60  ANIONGAP 11   < > 12 9 6    < > = values in this interval not displayed.     Hematology Recent Labs  Lab 04/03/21 0351 04/04/21 0355 04/05/21 0352  WBC 8.2 13.0* 9.3  RBC 4.65 4.25 4.05  HGB 13.5 12.3 11.9*  HCT 41.2 37.8 36.6  MCV 88.6 88.9 90.4  MCH 29.0 28.9 29.4  MCHC 32.8 32.5 32.5  RDW 15.6* 15.9* 15.4  PLT 433* 401* 377   Lab Results  Component Value Date   TSH 0.801 04/01/2021   Lab Results  Component Value Date   CHOL 150 10/18/2020   HDL 74 10/18/2020   LDLCALC 51 10/18/2020   TRIG 153 (H) 10/18/2020   CHOLHDL 2.0 10/18/2020   Lab Results  Component Value Date   HGBA1C 6.2 (H) 02/20/2021    BNPNo results for input(s): BNP, PROBNP in the last 168 hours.   DDimer No results for input(s): DDIMER in the last 168 hours.   Radiology    MR CARDIAC MORPHOLOGY W WO CONTRAST  Result Date: 04/03/2021 CLINICAL DATA:  HCM evaluation EXAM: CARDIAC MRI TECHNIQUE: The patient was scanned on a 1.5 Tesla Siemens magnet. A dedicated  cardiac coil was used. Functional imaging was done using Fiesta sequences. 2,3, and 4 chamber views were done to assess for RWMA's. Modified Simpson's rule using a short axis stack was used to calculate an ejection fraction on a dedicated work Conservation officer, nature. The patient received 10 cc of Gadavist. After 10 minutes inversion recovery sequences were used to assess for infiltration and scar tissue. CONTRAST:  10 cc  of Gadavist FINDINGS: Left ventricle: -Basal septal thickening measuring up to 79mm (68mm in posterior wall) -Small LV size -Normal systolic function -Mild ECV elevation (32%) -Patchy LGE primarily at RV insertion site accounting for 2% of total myocardial mass LV EF: 63% (Normal 56-78%) Absolute volumes: LV EDV: 87mL (Normal 77-195 mL) LV ESV: 63mL (Normal 19-72 mL) LV SV: 24mL (Normal 51-133 mL) CO: 6.2L/min (Normal 2.8-8.8 L/min) Indexed volumes: LV EDV: 62mL/sq-m (Normal 47-92 mL/sq-m) LV ESV: 20mL/sq-m (Normal 13-30 mL/sq-m) LV SV: 91mL/sq-m (Normal 32-62 mL/sq-m) CI: 3.8L/min/sq-m (Normal 1.7-4.2 L/min/sq-m) Right ventricle: Small size, normal systolic function RV EF:  66% (Normal 47-74%) Absolute volumes: RV EDV: 58mL (Normal 88-227 mL) RV ESV: 9mL (Normal 23-103 mL) RV SV: 90mL (Normal 52-138 mL) CO: 6.5L/min (Normal 2.8-8.8 L/min) Indexed volumes: RV EDV: 38mL/sq-m (Normal 55-105 mL/sq-m) RV ESV: 61mL/sq-m (Normal 15-43 mL/sq-m) RV SV: 42mL/sq-m (Normal 32-64 mL/sq-m) CI: L/min/sq-m (Normal 1.7-4.2 L/min/sq-m) Left atrium: Mild enlargement Right atrium: Normal size Mitral valve: Mild regurgitation Aortic valve: No regurgitation Tricuspid valve: No regurgitation Pulmonic valve: No regurgitation Aorta: Mild ascending aortic dilatation measuring 27mm Pericardium: Normal IMPRESSION: 1. Asymmetric LV hypertrophy measuring up to 37mm in basal septum (39mm in posterior wall), consistent with hypertrophic cardiomyopathy 2. Patchy late gadolinium enhancement primarily at RV insertion site,  consistent with HCM. LGE accounts for 2% of total myocardial mass 3.  Small LV size with normal systolic function (EF 78%) 4.  Small RV size with normal systolic function (EF 46%) Electronically Signed   By: Oswaldo Milian M.D.   On:  04/03/2021 19:22    Cardiac Studies   CARDIAC MRI: 04/03/2021 IMPRESSION: 1. Asymmetric LV hypertrophy measuring up to 38mm in basal septum (75mm in posterior wall), consistent with hypertrophic cardiomyopathy   2. Patchy late gadolinium enhancement primarily at RV insertion site, consistent with HCM. LGE accounts for 2% of total myocardial mass   3.  Small LV size with normal systolic function (EF 38%)   4.  Small RV size with normal systolic function (EF 88%)  Echo 04/01/2021:  1. Left ventricular ejection fraction, by estimation, is 65 to 70%. The  left ventricle has hyperdynamic function. The left ventricle has no  regional wall motion abnormalities. There is severe focal basal septal left ventricular hypertrophy, no LV outflow tract gradient. Left ventricular diastolic parameters are consistent with Grade I diastolic dysfunction (impaired relaxation). The average left ventricular global longitudinal strain is -20.7 %. Global longitudinal strain normal.   2. Right ventricular systolic function is normal. The right ventricular  size is normal. There is normal pulmonary artery systolic pressure. The estimated right ventricular systolic pressure is 28.0 mmHg.   3. Left atrial size was mildly dilated.   4. The mitral valve is normal in structure. Trivial mitral valve  regurgitation. No evidence of mitral stenosis.   5. The aortic valve is tricuspid. Aortic valve regurgitation is mild.  Mild aortic valve sclerosis is present, with no evidence of aortic valve stenosis.   6. Aortic dilatation noted. There is mild dilatation of the ascending  aorta, measuring 41 mm.   7. The inferior vena cava is normal in size with greater than 50%  respiratory  variability, suggesting right atrial pressure of 3 mmHg.   Patient Profile     86 y.o. female with history of CVA in 2017, T2DM, hypertension, hyperlipidemia who we are consulted by Dr. Sabino Gasser for evaluation of syncope  Assessment & Plan    Syncope: Unclear cause.  - +HOCM - possibly orthostatic vs arrhythmia - live Zio at d/c but it will have to be mailed, not put on by ECG staff at [facility] - continue to follow on telemetry while here - make sure she stays hydrated - keep water by the bed and drink some when she gets up every am  Hypertension:  - HR a little better, BP a little better but still elevated most of the time after Coreg 6.25 mg bid added - increase Coreg to 9.75 mg bid - cont amlodipine  Aortic dilatation:  - repeat echo in 1 year  N&V - restarted on prev GI meds - per IM   [facility] will sign off.   Medication Recommendations:  Amlodipine 10 mg daily, Coreg 9.375 mg twice daily,  Other recommendations (labs, testing, etc):  Zio patch live x 2 weeks (will order) Follow up as an outpatient:  Will schedule   For questions or updates, please contact [place] Please consult [email or web address] for contact info under      Signed, Rosaria Ferries, PA-C  04/05/2021, 10:25 AM      Patient seen and examined.  Agree with above documentation.  On exam, patient is alert and oriented, regular rate and rhythm, no murmurs, lungs CTAB, no LE edema or JVD.  Telemetry shows sinus rhythm with rate 80s to 100s, intermittent Mobitz type I.  Will plan to discharge with Zio patch x2 weeks.  We will schedule outpatient follow-up.  Donato Heinz, MD

## 2021-04-04 NOTE — Progress Notes (Signed)
Progress Note    Anna Hoffman   XAJ:[id number]  DOB: [birth year]  DOA: 03/31/2021     1  PCP: Anna Chard, MD  Initial CC: fall/syncope  Hospital Course: [name] is an 86 yo female with PMH DMII, HTN, CVA, arthritis who presented after a syncopal fall at home.  She does not remember the episode and it was reported that the event lasted less than 1 minute.  She was found by her son on the floor after they heard her fall.  She was admitted for further workup.   Interval History:  Still having N/V. Also has truncal weakness with difficulty sitting up in bed easily without falling back.   ROS: Constitutional: negative for chills and fevers, Respiratory: negative for cough, Cardiovascular: negative for chest pain, and Gastrointestinal: negative for abdominal pain  Assessment & Plan: * Syncope and collapse-resolved as of 04/04/2021 - patient denied prior history of similar but description of event is consistent with LOC - etiology presumed cardiac with further workup required. She has a history of AV block and also reports some CP when she's exerting herself at times. Echo shows severe focal basal septal LVH; cardiac MRI consistent with HCM - orthostatics negative and repeated; remained negative - cardiology consulted for further assistance given prior findings - troponin negative on 8/31  Hypertrophic cardiomyopathy (Anna Hoffman) - confirmed on cardiac MRI - possible contribution to syncope if she was also dehydrated - coreg started per cardiology - continue IVF for now; decrease rate to 75  Nausea and vomiting - began overnight of 8/30 (was given 2 doses decadron IV as well); past GI notes reviewed (hx Grade C esophagitis s/p EGD 10/2020) - she responded well to BID protonix and carafate in the past; these have been resumed   Benign essential HTN - Negative orthostatics - Continue amlodipine - now on coreg as well   Thyroid nodule - Incidental right thyroid nodule noted on CT.   Measures 20 mm. -Nonemergent ultrasound recommended.  May be done outpatient  Fall at home, initial encounter - presumed from a syncopal event as she has no prodrome symptoms and no recollection - CTH, CT-C spine, and CT maxillofacial are negative for acute abnormalities or fracture - Rib xray non-diagnostic, possible left sided rib fx; treatment still supportive care   Physical deconditioning - patient appears to be deconditioned at this point; trouble sitting upright in bed - PT eval   Chronic kidney disease, stage 3a (Anna Hoffman) - patient has history of CKD3a. Baseline creat ~ 1, eGFR 50  Mixed hyperlipidemia - Continue statin  Type 2 diabetes mellitus with stage 3 chronic kidney disease, without long-term current use of insulin ([name]) - last A1c 6.2% on 02/20/21  Leukocytosis-resolved as of 04/02/2021 - Considered reactive on admission - Has normalized   Old records reviewed in assessment of this patient  Antimicrobials:   DVT prophylaxis: SCDs Start: 03/31/21 1752   Code Status:   Code Status: Full Code Family Communication:   Disposition Plan: Status is: Inpatient  Remains inpatient appropriate because:IV treatments appropriate due to intensity of illness or inability to take PO and Inpatient level of care appropriate due to severity of illness  Dispo: The patient is from: Home              Anticipated d/c is to: Home              Patient currently is not medically stable to d/c.   Difficult to place patient No  Risk  of unplanned readmission score: Unplanned Admission- Pilot do not use: 19.89   Objective: Blood pressure (!) 132/92, pulse (!) 106, temperature 98.6 F (37 C), temperature source Oral, resp. rate 14, height 5' 5.5" (1.664 m), weight 60.8 kg, SpO2 96 %.  Examination: General appearance: alert, cooperative, and no distress Head: Normocephalic, without obvious abnormality, atraumatic Eyes:  EOMI Lungs: clear to auscultation bilaterally Heart: regular  rate and rhythm and S1, S2 normal Abdomen: normal findings: bowel sounds normal and soft, non-tender Extremities:  no edema Skin: mobility and turgor normal Neurologic: Grossly normal  Consultants:  Cardiology  Procedures:    Data Reviewed: I have personally reviewed following labs and imaging studies Results for orders placed or performed during the hospital encounter of 03/31/21 (from the past 24 hour(s))  Troponin I (High Sensitivity)     Status: None   Collection Time: 04/03/21  3:44 PM  Result Value Ref Range   Troponin I (High Sensitivity) 13 <18 ng/L  Glucose, capillary     Status: Abnormal   Collection Time: 04/03/21  5:19 PM  Result Value Ref Range   Glucose-Capillary 131 (H) 70 - 99 mg/dL  Glucose, capillary     Status: Abnormal   Collection Time: 04/03/21 10:18 PM  Result Value Ref Range   Glucose-Capillary 144 (H) 70 - 99 mg/dL  Basic metabolic panel     Status: Abnormal   Collection Time: 04/04/21  3:55 AM  Result Value Ref Range   Sodium 136 135 - 145 mmol/L   Potassium 3.8 3.5 - 5.1 mmol/L   Chloride 107 98 - 111 mmol/L   CO2 20 (L) 22 - 32 mmol/L   Glucose, Bld 127 (H) 70 - 99 mg/dL   BUN 25 (H) 8 - 23 mg/dL   Creatinine, Ser 0.78 0.44 - 1.00 mg/dL   Calcium 9.3 8.9 - 10.3 mg/dL   GFR, Estimated >60 >60 mL/min   Anion gap 9 5 - 15  CBC with Differential/Platelet     Status: Abnormal   Collection Time: 04/04/21  3:55 AM  Result Value Ref Range   WBC 13.0 (H) 4.0 - 10.5 K/uL   RBC 4.25 3.87 - 5.11 MIL/uL   Hemoglobin 12.3 12.0 - 15.0 g/dL   HCT 37.8 36.0 - 46.0 %   MCV 88.9 80.0 - 100.0 fL   MCH 28.9 26.0 - 34.0 pg   MCHC 32.5 30.0 - 36.0 g/dL   RDW 15.9 (H) 11.5 - 15.5 %   Platelets 401 (H) 150 - 400 K/uL   nRBC 0.0 0.0 - 0.2 %   Neutrophils Relative % 76 %   Neutro Abs 9.8 (H) 1.7 - 7.7 K/uL   Lymphocytes Relative 15 %   Lymphs Abs 2.0 0.7 - 4.0 K/uL   Monocytes Relative 8 %   Monocytes Absolute 1.1 (H) 0.1 - 1.0 K/uL   Eosinophils Relative 0  %   Eosinophils Absolute 0.0 0.0 - 0.5 K/uL   Basophils Relative 0 %   Basophils Absolute 0.0 0.0 - 0.1 K/uL   Immature Granulocytes 1 %   Abs Immature Granulocytes 0.08 (H) 0.00 - 0.07 K/uL  Magnesium     Status: None   Collection Time: 04/04/21  3:55 AM  Result Value Ref Range   Magnesium 2.0 1.7 - 2.4 mg/dL    Recent Results (from the past 240 hour(s))  Resp Panel by RT-PCR (Flu A&B, Covid) Nasopharyngeal Swab     Status: None   Collection Time: 03/31/21  7:59 PM  Specimen: Nasopharyngeal Swab; Nasopharyngeal(NP) swabs in vial transport medium  Result Value Ref Range Status   SARS Coronavirus 2 by RT PCR NEGATIVE NEGATIVE Final    Comment: (NOTE) SARS-CoV-2 target nucleic acids are NOT DETECTED.  The SARS-CoV-2 RNA is generally detectable in upper respiratory specimens during the acute phase of infection. The lowest concentration of SARS-CoV-2 viral copies this assay can detect is 138 copies/mL. A negative result does not preclude SARS-Cov-2 infection and should not be used as the sole basis for treatment or other patient management decisions. A negative result may occur with  improper specimen collection/handling, submission of specimen other than nasopharyngeal swab, presence of viral mutation(s) within the areas targeted by this assay, and inadequate number of viral copies(<138 copies/mL). A negative result must be combined with clinical observations, patient history, and epidemiological information. The expected result is Negative.  Fact Sheet for Patients:  EntrepreneurPulse.com.au  Fact Sheet for Healthcare Providers:  IncredibleEmployment.be  This test is no t yet approved or cleared by the [place] FDA and  has been authorized for detection and/or diagnosis of SARS-CoV-2 by FDA under an Emergency Use Authorization (EUA). This EUA will remain  in effect (meaning this test can be used) for the duration of the COVID-19  declaration under Section 564(b)(1) of the Act, 21 U.S.C.section 360bbb-3(b)(1), unless the authorization is terminated  or revoked sooner.       Influenza A by PCR NEGATIVE NEGATIVE Final   Influenza B by PCR NEGATIVE NEGATIVE Final    Comment: (NOTE) The Xpert Xpress SARS-CoV-2/FLU/RSV plus assay is intended as an aid in the diagnosis of influenza from Nasopharyngeal swab specimens and should not be used as a sole basis for treatment. Nasal washings and aspirates are unacceptable for Xpert Xpress SARS-CoV-2/FLU/RSV testing.  Fact Sheet for Patients: EntrepreneurPulse.com.au  Fact Sheet for Healthcare Providers: IncredibleEmployment.be  This test is not yet approved or cleared by the [place] FDA and has been authorized for detection and/or diagnosis of SARS-CoV-2 by FDA under an Emergency Use Authorization (EUA). This EUA will remain in effect (meaning this test can be used) for the duration of the COVID-19 declaration under Section 564(b)(1) of the Act, 21 U.S.C. section 360bbb-3(b)(1), unless the authorization is terminated or revoked.  Performed at [facility], [place] [address], [place],  [address]      Radiology Studies: MR CARDIAC MORPHOLOGY W WO CONTRAST  Result Date: 04/03/2021 CLINICAL DATA:  HCM evaluation EXAM: CARDIAC MRI TECHNIQUE: The patient was scanned on a 1.5 Tesla Siemens magnet. A dedicated cardiac coil was used. Functional imaging was done using Fiesta sequences. 2,3, and 4 chamber views were done to assess for RWMA's. Modified Simpson's rule using a short axis stack was used to calculate an ejection fraction on a dedicated work Conservation officer, nature. The patient received 10 cc of Gadavist. After 10 minutes inversion recovery sequences were used to assess for infiltration and scar tissue. CONTRAST:  10 cc  of Gadavist FINDINGS: Left ventricle: -Basal septal thickening measuring up to  106m (127min posterior wall) -Small LV size -Normal systolic function -Mild ECV elevation (32%) -Patchy LGE primarily at RV insertion site accounting for 2% of total myocardial mass LV EF: 63% (Normal 56-78%) Absolute volumes: LV EDV: 7725mNormal 77-195 mL) LV ESV: 56m48mormal 19-72 mL) LV SV: 49mL75mrmal 51-133 mL) CO: 6.2L/min (Normal 2.8-8.8 L/min) Indexed volumes: LV EDV: 46mL/22m (Normal 47-92 mL/sq-m) LV ESV: 17mL/s26m(Normal 13-30 mL/sq-m) LV SV: 29mL/sq46mNormal 32-62 mL/sq-m)  CI: 3.8L/min/sq-m (Normal 1.7-4.2 L/min/sq-m) Right ventricle: Small size, normal systolic function RV EF:  66% (Normal 47-74%) Absolute volumes: RV EDV: 1m (Normal 88-227 mL) RV ESV: 278m(Normal 23-103 mL) RV SV: 5028mNormal 52-138 mL) CO: 6.5L/min (Normal 2.8-8.8 L/min) Indexed volumes: RV EDV: 62m38m-m (Normal 55-105 mL/sq-m) RV ESV: 15mL59mm (Normal 15-43 mL/sq-m) RV SV: 30mL/71m (Normal 32-64 mL/sq-m) CI: L/min/sq-m (Normal 1.7-4.2 L/min/sq-m) Left atrium: Mild enlargement Right atrium: Normal size Mitral valve: Mild regurgitation Aortic valve: No regurgitation Tricuspid valve: No regurgitation Pulmonic valve: No regurgitation Aorta: Mild ascending aortic dilatation measuring 37mm P48mardium: Normal IMPRESSION: 1. Asymmetric LV hypertrophy measuring up to 19mm in37mal septum (11mm in 14merior wall), consistent with hypertrophic cardiomyopathy 2. Patchy late gadolinium enhancement primarily at RV insertion site, consistent with HCM. LGE accounts for 2% of total myocardial mass 3.  Small LV size with normal systolic function (EF 63%) 4.  14%ll RV size with normal systolic function (EF 66%) Elec38%nically Signed   By: ChristophOswaldo Miliann: 04/03/2021 19:22   MR CARDIAC MORPHOLOGY W WO CONTRAST  Final Result    VAS US [place] Final Result    CT Head Wo Contrast  Final Result    CT Cervical Spine Wo Contrast  Final Result    CT Maxillofacial WO CM  Final Result    DG Ribs Unilateral W/Chest  Left  Final Result      Scheduled Meds:  amLODipine  10 mg Oral Daily   carvedilol  6.25 mg Oral BID WC   fexofenadine  180 mg Oral Daily   pantoprazole  40 mg Oral BID   rosuvastatin  20 mg Oral Daily   sucralfate  1 g Oral TID WC & HS   PRN Meds: acetaminophen **OR** acetaminophen, ALPRAZolam, alum & mag hydroxide-simeth, hydrALAZINE, lip balm, prochlorperazine, promethazine (PHENERGAN) injection (IM or IVPB) Continuous Infusions:  lactated ringers 100 mL/hr at 04/04/21 1000   promethazine (PHENERGAN) injection (IM or IVPB)       LOS: 1 day  Time spent: Greater than 50% of the 35 minute visit was spent in counseling/coordination of care for the patient as laid out in the A&P.   Jaquell Seddon [facility] 04/04/2021, 3:25 PM

## 2021-04-05 ENCOUNTER — Inpatient Hospital Stay (HOSPITAL_COMMUNITY): Payer: Medicare Other

## 2021-04-05 DIAGNOSIS — I10 Essential (primary) hypertension: Secondary | ICD-10-CM | POA: Diagnosis not present

## 2021-04-05 DIAGNOSIS — I77810 Thoracic aortic ectasia: Secondary | ICD-10-CM

## 2021-04-05 DIAGNOSIS — I422 Other hypertrophic cardiomyopathy: Secondary | ICD-10-CM | POA: Diagnosis not present

## 2021-04-05 DIAGNOSIS — R059 Cough, unspecified: Secondary | ICD-10-CM

## 2021-04-05 DIAGNOSIS — R5381 Other malaise: Secondary | ICD-10-CM

## 2021-04-05 DIAGNOSIS — R55 Syncope and collapse: Secondary | ICD-10-CM | POA: Diagnosis not present

## 2021-04-05 LAB — CBC WITH DIFFERENTIAL/PLATELET
Abs Immature Granulocytes: 0.05 10*3/uL (ref 0.00–0.07)
Basophils Absolute: 0 10*3/uL (ref 0.0–0.1)
Basophils Relative: 0 %
Eosinophils Absolute: 0.1 10*3/uL (ref 0.0–0.5)
Eosinophils Relative: 1 %
HCT: 36.6 % (ref 36.0–46.0)
Hemoglobin: 11.9 g/dL — ABNORMAL LOW (ref 12.0–15.0)
Immature Granulocytes: 1 %
Lymphocytes Relative: 31 %
Lymphs Abs: 2.9 10*3/uL (ref 0.7–4.0)
MCH: 29.4 pg (ref 26.0–34.0)
MCHC: 32.5 g/dL (ref 30.0–36.0)
MCV: 90.4 fL (ref 80.0–100.0)
Monocytes Absolute: 0.8 10*3/uL (ref 0.1–1.0)
Monocytes Relative: 8 %
Neutro Abs: 5.5 10*3/uL (ref 1.7–7.7)
Neutrophils Relative %: 59 %
Platelets: 377 10*3/uL (ref 150–400)
RBC: 4.05 MIL/uL (ref 3.87–5.11)
RDW: 15.4 % (ref 11.5–15.5)
WBC: 9.3 10*3/uL (ref 4.0–10.5)
nRBC: 0 % (ref 0.0–0.2)

## 2021-04-05 LAB — MAGNESIUM: Magnesium: 1.9 mg/dL (ref 1.7–2.4)

## 2021-04-05 LAB — GLUCOSE, CAPILLARY
Glucose-Capillary: 104 mg/dL — ABNORMAL HIGH (ref 70–99)
Glucose-Capillary: 106 mg/dL — ABNORMAL HIGH (ref 70–99)
Glucose-Capillary: 102 mg/dL — ABNORMAL HIGH (ref 70–99)
Glucose-Capillary: 145 mg/dL — ABNORMAL HIGH (ref 70–99)

## 2021-04-05 LAB — RESPIRATORY PANEL BY PCR

## 2021-04-05 LAB — PROCALCITONIN: Procalcitonin: <0.1 ng/mL

## 2021-04-05 LAB — BASIC METABOLIC PANEL
Anion gap: 6 (ref 5–15)
BUN: 21 mg/dL (ref 8–23)
CO2: 25 mmol/L (ref 22–32)
Calcium: 8.7 mg/dL — ABNORMAL LOW (ref 8.9–10.3)
Chloride: 107 mmol/L (ref 98–111)
Creatinine, Ser: 0.88 mg/dL (ref 0.44–1.00)
GFR, Estimated: >60 mL/min (ref >60)
Glucose, Bld: 110 mg/dL — ABNORMAL HIGH (ref 70–99)
Potassium: 3.5 mmol/L (ref 3.5–5.1)
Sodium: 138 mmol/L (ref 135–145)

## 2021-04-05 MED ORDER — CARVEDILOL 6.25 MG PO TABS
9.3750 mg | ORAL_TABLET | Freq: Two times a day (BID) | ORAL | Status: DC
  Administered 2021-04-05 – 2021-04-07 (×4): 9.375 mg via ORAL
  Filled 2021-04-05 (×4): qty 1

## 2021-04-05 MED ORDER — DM-GUAIFENESIN ER 30-600 MG PO TB12
1.0000 | ORAL_TABLET | Freq: Two times a day (BID) | ORAL | Status: DC
  Administered 2021-04-05 – 2021-04-09 (×9): 1 via ORAL
  Filled 2021-04-05 (×9): qty 1

## 2021-04-05 NOTE — NC FL2 (Signed)
[place] LEVEL OF CARE SCREENING TOOL     IDENTIFICATION  Patient Name: Anna Hoffman Birthdate: 1934/11/26 Sex: female Admission Date (Current Location): 03/31/2021  [facility] and [place] Number:  Herbalist and Address:  [facility],  [address], [place]      Provider Number: [id number]  Attending Physician Name and Address:  Dwyane Dee, MD  Relative Name and Phone Number:  Little Ishikawa [phone]  [phone]  Burna Mortimer [phone]  [phone]  Riki Altes Granddaughter   [phone]    Current Level of Care: Hospital Recommended Level of Care: [place] Prior Approval Number:    Date Approved/Denied:   PASRR Number: [id number]  Discharge Plan: SNF    Current Diagnoses: Patient Active Problem List   Diagnosis Date Noted   Ascending aorta dilatation ([place]) 04/05/2021   Cough 04/05/2021   Hypertrophic cardiomyopathy ([place]) 04/04/2021   Physical deconditioning 04/04/2021   Syncope 04/03/2021   Nausea and vomiting 04/03/2021   Fall at home, initial encounter 03/31/2021   Thyroid nodule 03/31/2021   CAP (community acquired pneumonia) 11/15/2020   Dehydration 11/15/2020   Erosive esophagitis    Hiatal hernia    Hyponatremia 10/10/2020   Acute kidney injury superimposed on chronic kidney disease ([place]) 10/10/2020   Aortic atherosclerosis ([place]) 10/09/2020   Acute renal failure superimposed on stage 3a chronic kidney disease ([place]) 10/09/2020   Hyperkalemia 10/09/2020   Hearing loss 10/09/2020   Prolonged QT interval 10/09/2020   Colitis, acute 08/11/2019   Colitis 07/11/2019   Loose stools 06/07/2019   Herpes zoster without complication 77/41/2878   Other long term (current) drug therapy 08/12/2018   Chronic kidney disease, stage 3a ([place]) 07/19/2018   Hypertensive nephropathy 07/19/2018   Community acquired pneumonia of right upper lobe of lung 11/07/2017    Hypertensive emergency 07/01/2016   Thalamic hemorrhage ([place]) 07/01/2016   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[place])    Benign essential HTN    Type 2 diabetes mellitus with stage 3 chronic kidney disease, without long-term current use of insulin ([name])    Mixed hyperlipidemia    ICH (intracerebral hemorrhage) ([place]) - hypertensive R thalamic hemorrhage  06/27/2016   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   Retroperitoneal bleed 02/08/2016   Generalized abdominal pain    Gastroenteritis 02/04/2016   Diarrhea 02/04/2016   Hypokalemia 02/04/2016   Incarcerated paraesophageal hernia 09/02/2014   Renal mass, left 09/02/2014   Acute esophagitis 09/02/2014   Gastric outlet obstruction 09/02/2014   Hypertension    Vomiting 09/01/2014    Orientation RESPIRATION BLADDER Height & Weight     Self, Time, Situation, Place  Normal Continent Weight: 134 lb 0.6 oz (60.8 kg) Height:  5' 5.5" (166.4 cm)  BEHAVIORAL SYMPTOMS/MOOD NEUROLOGICAL BOWEL NUTRITION STATUS      Continent Diet (Carb Modified)  AMBULATORY STATUS COMMUNICATION OF NEEDS Skin   Limited Assist Verbally Normal                       Personal Care Assistance Level of Assistance  Bathing, Feeding, Dressing Bathing Assistance: Limited assistance Feeding assistance: Independent Dressing Assistance: Limited assistance     Functional Limitations Info  Sight, Speech, Hearing Sight Info: Adequate Hearing Info: Adequate Speech Info: Adequate    SPECIAL CARE FACTORS FREQUENCY  PT (By licensed PT), OT (By licensed OT)     PT Frequency: Minimum 5x a week OT Frequency: Minimum 5x a week  Contractures Contractures Info: Not present    Additional Factors Info  Code Status, Allergies, Isolation Precautions Code Status Info: Full Code Allergies Info: Amoxicillin   Ampicillin   Sulfa Antibiotics     Isolation Precautions Info: Droplet precautions     Current Medications (04/05/2021):  This is the current  hospital active medication list Current Facility-Administered Medications  Medication Dose Route Frequency Provider Last Rate Last Admin   acetaminophen (TYLENOL) tablet 650 mg  650 mg Oral Q6H PRN Nicoletta Dress, Na, MD   650 mg at 04/04/21 1747   Or   acetaminophen (TYLENOL) suppository 650 mg  650 mg Rectal Q6H PRN Charlann Lange, MD       ALPRAZolam Duanne Moron) tablet 0.5 mg  0.5 mg Oral TID PRN Skipper Cliche A, MD   0.5 mg at 04/05/21 0856   alum & mag hydroxide-simeth (MAALOX/MYLANTA) 200-200-20 MG/5ML suspension 30 mL  30 mL Oral Q4H PRN Nicoletta Dress, Na, MD   30 mL at 04/02/21 1411   amLODipine (NORVASC) tablet 10 mg  10 mg Oral Daily Nicoletta Dress, Na, MD   10 mg at 04/05/21 0856   carvedilol (COREG) tablet 9.375 mg  9.375 mg Oral BID WC Barrett, Evelene Croon, PA-C       dextromethorphan-guaiFENesin (MUCINEX DM) 30-600 MG per 12 hr tablet 1 tablet  1 tablet Oral BID Dwyane Dee, MD   1 tablet at 04/05/21 1214   fexofenadine (ALLEGRA) tablet 180 mg  180 mg Oral Daily Barrett, Rhonda G, PA-C   180 mg at 04/05/21 0856   hydrALAZINE (APRESOLINE) injection 10 mg  10 mg Intravenous Q4H PRN Skipper Cliche A, MD   10 mg at 04/02/21 2145   lip balm (CARMEX) ointment   Topical PRN Leodis Sias T, RPH       pantoprazole (PROTONIX) EC tablet 40 mg  40 mg Oral BID Dwyane Dee, MD   40 mg at 04/05/21 0856   prochlorperazine (COMPAZINE) injection 10 mg  10 mg Intravenous Q4H PRN Dwyane Dee, MD   10 mg at 04/04/21 0933   promethazine (PHENERGAN) 12.5 mg in sodium chloride 0.9 % 50 mL IVPB  12.5 mg Intravenous Q6H PRN Dwyane Dee, MD       rosuvastatin (CRESTOR) tablet 20 mg  20 mg Oral Daily Li, Na, MD   20 mg at 04/05/21 0856   sucralfate (CARAFATE) 1 GM/10ML suspension 1 g  1 g Oral TID WC & HS Barrett, Rhonda G, PA-C   1 g at 04/05/21 1214     Discharge Medications: Please see discharge summary for a list of discharge medications.  Relevant Imaging Results:  Relevant Lab Results:   Additional Information SSN  [id number]  Ross Ludwig, LCSW

## 2021-04-05 NOTE — Progress Notes (Signed)
Progress Note  Patient Name: Anna Hoffman [facility] Date of Encounter: 04/05/2021  [facility] Cardiologist: None   Subjective   C/o some GI sx, no dizziness  Inpatient Medications    Scheduled Meds:  amLODipine  10 mg Oral Daily   carvedilol  6.25 mg Oral BID WC   dextromethorphan-guaiFENesin  1 tablet Oral BID   fexofenadine  180 mg Oral Daily   pantoprazole  40 mg Oral BID   rosuvastatin  20 mg Oral Daily   sucralfate  1 g Oral TID WC & HS   Continuous Infusions:  lactated ringers 75 mL/hr at 04/05/21 0241   promethazine (PHENERGAN) injection (IM or IVPB)     PRN Meds: acetaminophen **OR** acetaminophen, ALPRAZolam, alum & mag hydroxide-simeth, hydrALAZINE, lip balm, prochlorperazine, promethazine (PHENERGAN) injection (IM or IVPB)   Vital Signs    Vitals:   04/04/21 2023 04/05/21 0549 04/05/21 0612 04/05/21 0820  BP: (!) 141/86 (!) 151/87 (!) 171/94 (!) 154/84  Pulse: 96 99 (!) 104 88  Resp: 15 14    Temp: 99.3 F (37.4 C)     TempSrc: Oral     SpO2: 94% 96%    Weight:      Height:        Intake/Output Summary (Last 24 hours) at 04/05/2021 1027 Last data filed at 04/05/2021 0355 Gross per 24 hour  Intake 2644.57 ml  Output 551 ml  Net 2093.57 ml    Last 3 Weights 03/31/2021 03/21/2021 02/14/2021  Weight (lbs) 134 lb 0.6 oz 133 lb 6.4 oz 138 lb  Weight (kg) 60.8 kg 60.51 kg 62.596 kg    Orthostatic VS 08/30 Position BP HR  Lying 170/99 109  Sitting 153/97 115  Standing 166/103 125  Standing at 3" 180/110 128        Telemetry    SR, PACs, some non-conducted - Personally Reviewed  ECG    No new ECG- Personally Reviewed  Physical Exam   General: Well developed, well nourished, female in no acute distress Head: Eyes PERRLA, Head normocephalic and atraumatic Lungs: few rales bases bilaterally to auscultation. Heart: HRRR S1 S2, without rub or gallop. soft murmur. 4/4 extremity pulses are 2+ & equal. No JVD. Abdomen: Bowel sounds are present, abdomen  soft and non-tender without masses or  hernias noted. Msk: weak strength and tone for age. Extremities: No clubbing, cyanosis or edema.    Skin:  No rashes or lesions noted. Neuro: Alert and oriented X 3. Psych:  Good affect, responds appropriately  Labs    High Sensitivity Troponin:   Recent Labs  Lab 03/31/21 1220 03/31/21 1448 04/03/21 1402 04/03/21 1544  TROPONINIHS 6 5 13 13        Chemistry Recent Labs  Lab 03/31/21 1220 04/01/21 0338 04/03/21 0351 04/04/21 0355 04/05/21 0352  NA 140   < > 141 136 138  K 4.2   < > 4.3 3.8 3.5  CL 105   < > 109 107 107  CO2 24   < > 20* 20* 25  GLUCOSE 104*   < > 158* 127* 110*  BUN 15   < > 26* 25* 21  CREATININE 0.87   < > 1.00 0.78 0.88  CALCIUM 9.5   < > 9.8 9.3 8.7*  PROT 7.6  --   --   --   --   ALBUMIN 4.4  --   --   --   --   AST 25  --   --   --   --  ALT 17  --   --   --   --   ALKPHOS 56  --   --   --   --   BILITOT 0.6  --   --   --   --   GFRNONAA >60   < > 56* >60 >60  ANIONGAP 11   < > 12 9 6    < > = values in this interval not displayed.      Hematology Recent Labs  Lab 04/03/21 0351 04/04/21 0355 04/05/21 0352  WBC 8.2 13.0* 9.3  RBC 4.65 4.25 4.05  HGB 13.5 12.3 11.9*  HCT 41.2 37.8 36.6  MCV 88.6 88.9 90.4  MCH 29.0 28.9 29.4  MCHC 32.8 32.5 32.5  RDW 15.6* 15.9* 15.4  PLT 433* 401* 377    Lab Results  Component Value Date   TSH 0.801 04/01/2021   Lab Results  Component Value Date   CHOL 150 10/18/2020   HDL 74 10/18/2020   LDLCALC 51 10/18/2020   TRIG 153 (H) 10/18/2020   CHOLHDL 2.0 10/18/2020   Lab Results  Component Value Date   HGBA1C 6.2 (H) 02/20/2021    BNPNo results for input(s): BNP, PROBNP in the last 168 hours.   DDimer No results for input(s): DDIMER in the last 168 hours.   Radiology    MR CARDIAC MORPHOLOGY W WO CONTRAST  Result Date: 04/03/2021 CLINICAL DATA:  HCM evaluation EXAM: CARDIAC MRI TECHNIQUE: The patient was scanned on a 1.5 Tesla Siemens  magnet. A dedicated cardiac coil was used. Functional imaging was done using Fiesta sequences. 2,3, and 4 chamber views were done to assess for RWMA's. Modified Simpson's rule using a short axis stack was used to calculate an ejection fraction on a dedicated work Conservation officer, nature. The patient received 10 cc of Gadavist. After 10 minutes inversion recovery sequences were used to assess for infiltration and scar tissue. CONTRAST:  10 cc  of Gadavist FINDINGS: Left ventricle: -Basal septal thickening measuring up to 28mm (24mm in posterior wall) -Small LV size -Normal systolic function -Mild ECV elevation (32%) -Patchy LGE primarily at RV insertion site accounting for 2% of total myocardial mass LV EF: 63% (Normal 56-78%) Absolute volumes: LV EDV: 21mL (Normal 77-195 mL) LV ESV: 64mL (Normal 19-72 mL) LV SV: 82mL (Normal 51-133 mL) CO: 6.2L/min (Normal 2.8-8.8 L/min) Indexed volumes: LV EDV: 65mL/sq-m (Normal 47-92 mL/sq-m) LV ESV: 72mL/sq-m (Normal 13-30 mL/sq-m) LV SV: 31mL/sq-m (Normal 32-62 mL/sq-m) CI: 3.8L/min/sq-m (Normal 1.7-4.2 L/min/sq-m) Right ventricle: Small size, normal systolic function RV EF:  66% (Normal 47-74%) Absolute volumes: RV EDV: 43mL (Normal 88-227 mL) RV ESV: 57mL (Normal 23-103 mL) RV SV: 30mL (Normal 52-138 mL) CO: 6.5L/min (Normal 2.8-8.8 L/min) Indexed volumes: RV EDV: 63mL/sq-m (Normal 55-105 mL/sq-m) RV ESV: 48mL/sq-m (Normal 15-43 mL/sq-m) RV SV: 47mL/sq-m (Normal 32-64 mL/sq-m) CI: L/min/sq-m (Normal 1.7-4.2 L/min/sq-m) Left atrium: Mild enlargement Right atrium: Normal size Mitral valve: Mild regurgitation Aortic valve: No regurgitation Tricuspid valve: No regurgitation Pulmonic valve: No regurgitation Aorta: Mild ascending aortic dilatation measuring 61mm Pericardium: Normal IMPRESSION: 1. Asymmetric LV hypertrophy measuring up to 5mm in basal septum (38mm in posterior wall), consistent with hypertrophic cardiomyopathy 2. Patchy late gadolinium enhancement primarily at  RV insertion site, consistent with HCM. LGE accounts for 2% of total myocardial mass 3.  Small LV size with normal systolic function (EF 78%) 4.  Small RV size with normal systolic function (EF 46%) Electronically Signed   By: Lysle Rubens.D.  On: 04/03/2021 19:22    Cardiac Studies   CARDIAC MRI: 04/03/2021 IMPRESSION: 1. Asymmetric LV hypertrophy measuring up to 24mm in basal septum (58mm in posterior wall), consistent with hypertrophic cardiomyopathy   2. Patchy late gadolinium enhancement primarily at RV insertion site, consistent with HCM. LGE accounts for 2% of total myocardial mass   3.  Small LV size with normal systolic function (EF 34%)   4.  Small RV size with normal systolic function (EF 03%)  Echo 04/01/2021:  1. Left ventricular ejection fraction, by estimation, is 65 to 70%. The  left ventricle has hyperdynamic function. The left ventricle has no  regional wall motion abnormalities. There is severe focal basal septal left ventricular hypertrophy, no LV outflow tract gradient. Left ventricular diastolic parameters are consistent with Grade I diastolic dysfunction (impaired relaxation). The average left ventricular global longitudinal strain is -20.7 %. Global longitudinal strain normal.   2. Right ventricular systolic function is normal. The right ventricular  size is normal. There is normal pulmonary artery systolic pressure. The estimated right ventricular systolic pressure is 70.9 mmHg.   3. Left atrial size was mildly dilated.   4. The mitral valve is normal in structure. Trivial mitral valve  regurgitation. No evidence of mitral stenosis.   5. The aortic valve is tricuspid. Aortic valve regurgitation is mild.  Mild aortic valve sclerosis is present, with no evidence of aortic valve stenosis.   6. Aortic dilatation noted. There is mild dilatation of the ascending  aorta, measuring 41 mm.   7. The inferior vena cava is normal in size with greater than 50%   respiratory variability, suggesting right atrial pressure of 3 mmHg.   Patient Profile     86 y.o. female with history of CVA in 2017, T2DM, hypertension, hyperlipidemia who we are consulted by Dr. Sabino Gasser for evaluation of syncope  Assessment & Plan    Syncope: Unclear cause.  - +HOCM - possibly orthostatic vs arrhythmia - live Zio at d/c but it will have to be mailed, not put on by ECG staff at [facility] - continue to follow on telemetry while here - make sure she stays hydrated  Hypertension:  - HR a little better, BP a little better but still elevated most of the time after Coreg 6.25 mg bid added - increase Coreg to 9.75 mg bid - cont amlodipine  Aortic dilatation:  - repeat echo in 1 year  N&V - restarted on prev GI meds - per IM     For questions or updates, please contact [place] Please consult [email or web address] for contact info under      Signed, Rosaria Ferries, PA-C  04/05/2021, 10:27 AM

## 2021-04-05 NOTE — Assessment & Plan Note (Addendum)
-   differential includes GERD vs allergies vs URI that is developing - CXR obtained on 9/2 is clear - continue allergy meds; added mucinex - check RVP swab = negative -Symptoms improved on 04/06/2021

## 2021-04-05 NOTE — Assessment & Plan Note (Addendum)
-   noted on echo on 04/01/21 - measures 41 mm - repeat echo in 1 year per cardiology

## 2021-04-05 NOTE — Progress Notes (Signed)
Physical Therapy Treatment Patient Details Name: Anna Hoffman MRN: [id number] DOB: [birth year] Today's Date: 04/05/2021    History of Present Illness Cheral Cappucci is a 86 y.o. female with medical history significant of T2DM, HTN, CVA, who presented with syncope and fall at home.    PT Comments    General Comments: AxO x 3 very pleasant lady and self aware how she feels bad and unsteady Pt required more assist.  General bed mobility comments: increased assist and time due feeling "poorly" General transfer comment: required increased assist and very unsteady.  Drunk.  Near fall in bathroom as she turned to toilet and urinated on self.General Gait Details: required even more assist this session.  Max c/o feeling "bad" and present with very unsteady/weak gait.  Assisted with amb from bed to bathroom almost resulted in a fall as B knees buckled.  Also unsteady with turning to toilet.  Therapist recovering/correcting. Assisted to recliner and positioned to comfort. Will update LPT pt's poor progress and consult for poss need for SNF.   Follow Up Recommendations  SNF     Equipment Recommendations       Recommendations for Other Services       Precautions / Restrictions Precautions Precautions: Fall Precaution Comments: monitor vitals Restrictions Weight Bearing Restrictions: No    Mobility  Bed Mobility Overal bed mobility: Needs Assistance Bed Mobility: Supine to Sit     Supine to sit: Mod assist     General bed mobility comments: increased assist and time due feeling "poorly"    Transfers Overall transfer level: Needs assistance Equipment used: Rolling walker (2 wheeled) Transfers: Sit to/from [facility] Sit to Stand: Mod assist Stand pivot transfers: Mod assist;Max assist       General transfer comment: required increased assist and very unsteady.  Drunk.  Near fall in bathroom as she turned to toilet and urinated on  self.  Ambulation/Gait Ambulation/Gait assistance: Mod assist;Max assist Gait Distance (Feet): 7 Feet Assistive device: Rolling walker (2 wheeled) Gait Pattern/deviations: Step-through pattern;Decreased stride length;Drifts right/left Gait velocity: decreased   General Gait Details: required even more assist this session.  Max c/o feeling "bad" and present with very unsteady/weak gait.  Assisted with amb from bed to bathroom almost resulted in a fall as B knees buckled.  Also unsteady with turning to toilet.  Therapist recovering/correcting.   Stairs             Wheelchair Mobility    Modified Rankin (Stroke Patients Only)       Balance                                            Cognition Arousal/Alertness: Awake/alert Behavior During Therapy: WFL for tasks assessed/performed                                   General Comments: AxO x 3 very pleasant lady and self aware how she feels bad and unsteady      Exercises      General Comments        Pertinent Vitals/Pain Pain Assessment: No/denies pain    Home Living                      Prior Function  PT Goals (current goals can now be found in the care plan section) Progress towards PT goals: Progressing toward goals    Frequency    Min 3X/week      PT Plan Discharge plan needs to be updated    Co-evaluation              AM-PAC PT "6 Clicks" Mobility   Outcome Measure  Help needed turning from your back to your side while in a flat bed without using bedrails?: A Lot Help needed moving from lying on your back to sitting on the side of a flat bed without using bedrails?: A Lot Help needed moving to and from a bed to a chair (including a wheelchair)?: A Lot Help needed standing up from a chair using your arms (e.g., wheelchair or bedside chair)?: A Lot Help needed to walk in hospital room?: A Lot Help needed climbing 3-5 steps with a  railing? : Total 6 Click Score: 11    End of Session Equipment Utilized During Treatment: Gait belt Activity Tolerance: Patient limited by fatigue;Other (comment) Patient left: in chair;with chair alarm set;with call bell/phone within reach Nurse Communication: Mobility status PT Visit Diagnosis: Other abnormalities of gait and mobility (R26.89);History of falling (Z91.81);Muscle weakness (generalized) (M62.81)     Time: 0786-7544 PT Time Calculation (min) (ACUTE ONLY): 28 min  Charges:  $Gait Training: 8-22 mins $Therapeutic Activity: 8-22 mins                     {Lashonda Sonneborn  PTA Acute  Rehabilitation Services Pager      [phone] Office      [phone]

## 2021-04-05 NOTE — TOC Progression Note (Signed)
Transition of Care [facility]) - Progression Note    Patient Details  Name: Anna Hoffman MRN: [id number] Date of Birth: 30-Jul-1935  [facility]) CM/SW Contact  Ross Ludwig, [place] Phone Number: 04/05/2021, 3:54 PM  Clinical Narrative:     Patient faxed out for SNFs and FL2 completed.  Awaiting bed offers.        Expected Discharge Plan and Services                                                 Social Determinants of Health (SDOH) Interventions    Readmission Risk Interventions No flowsheet data found.

## 2021-04-05 NOTE — Progress Notes (Signed)
Progress Note    Anna Hoffman   OQH:[id number]  DOB: 1935/04/09  DOA: 03/31/2021     2  PCP: Anna Chard, MD  Initial CC: fall/syncope  Hospital Course: [name] is an 86 yo female with PMH DMII, HTN, CVA, arthritis who presented after a syncopal fall at home.  She does not remember the episode and it was reported that the event lasted less than 1 minute.  She was found by her son on the floor after they heard her fall.  She was admitted for further workup.   Interval History:  N/V seems better but now still coughing; feels very rundown and exhausted/lethargic.  PT now recommending SNF and she is agreeable.  ROS: Constitutional: negative for chills and fevers, Respiratory: negative for cough, Cardiovascular: negative for chest pain, and Gastrointestinal: negative for abdominal pain  Assessment & Plan: * Syncope and collapse-resolved as of 04/04/2021 - patient denied prior history of similar but description of event is consistent with LOC - etiology presumed cardiac with further workup required. She has a history of AV block and also reports some CP when she's exerting herself at times. Echo shows severe focal basal septal LVH; cardiac MRI consistent with HCM - orthostatics negative and repeated; remained negative - cardiology consulted for further assistance given prior findings - troponin negative on 8/31  Hypertrophic cardiomyopathy (Anna Hoffman) - confirmed on cardiac MRI - possible contribution to syncope if she was also dehydrated - coreg started per cardiology - s/p re-hydration with IVF; continue encouraging oral intake - Zio patch to be mailed   Cough - differential includes GERD vs URI that is developing - CXR obtained on 9/2 is clear - continue allergy meds; added mucinex - check RVP swab  Physical deconditioning - patient appears to be deconditioned at this point; trouble sitting upright in bed - PT eval; recommended SNF, patient agreeable    Nausea and  vomiting - began overnight of 8/30 (was given 2 doses decadron IV as well); past GI notes reviewed (hx Grade C esophagitis s/p EGD 10/2020) - she responded well to BID protonix and carafate in the past; these have been resumed   Benign essential HTN - Negative orthostatics - continue amlodipine and coreg  Ascending aorta dilatation (Anna Hoffman) - noted on echo on 04/01/21 - measures 41 mm - repeat echo in 1 year per cardiology   Thyroid nodule - Incidental right thyroid nodule noted on CT.  Measures 20 mm. -Nonemergent ultrasound recommended.  May be done outpatient  Fall at home, initial encounter - presumed from a syncopal event as she has no prodrome symptoms and no recollection - CTH, CT-C spine, and CT maxillofacial are negative for acute abnormalities or fracture - Rib xray non-diagnostic, possible left sided rib fx; treatment still supportive care   Chronic kidney disease, stage 3a (Anna Hoffman) - patient has history of CKD3a. Baseline creat ~ 1, eGFR 50  Mixed hyperlipidemia - Continue statin  Type 2 diabetes mellitus with stage 3 chronic kidney disease, without long-term current use of insulin ([name]) - last A1c 6.2% on 02/20/21  Leukocytosis-resolved as of 04/02/2021 - Considered reactive on admission - Has normalized   Old records reviewed in assessment of this patient  Antimicrobials:   DVT prophylaxis: SCDs Start: 03/31/21 1752   Code Status:   Code Status: Full Code Family Communication: son  Disposition Plan: Status is: Inpatient  Remains inpatient appropriate because:IV treatments appropriate due to intensity of illness or inability to take PO and Inpatient level of  care appropriate due to severity of illness  Dispo: The patient is from: Home              Anticipated d/c is to: SNF              Patient currently is not medically stable to d/c.   Difficult to place patient No  Risk of unplanned readmission score: Unplanned Admission- Pilot do not use: 22.37    Objective: Blood pressure 140/82, pulse 89, temperature 98.3 F (36.8 C), resp. rate 18, height 5' 5.5" (1.664 m), weight 60.8 kg, SpO2 93 %.  Examination: General appearance: alert, cooperative, fatigued, and no distress Head: Normocephalic, without obvious abnormality, atraumatic Eyes:  EOMI Lungs: clear to auscultation bilaterally Heart: regular rate and rhythm and S1, S2 normal Abdomen: normal findings: bowel sounds normal and soft, non-tender Extremities:  no edema Skin: mobility and turgor normal Neurologic: Grossly normal  Consultants:  Cardiology  Procedures:    Data Reviewed: I have personally reviewed following labs and imaging studies Results for orders placed or performed during the hospital encounter of 03/31/21 (from the past 24 hour(s))  Glucose, capillary     Status: Abnormal   Collection Time: 04/04/21  8:26 PM  Result Value Ref Range   Glucose-Capillary 119 (H) 70 - 99 mg/dL  Basic metabolic panel     Status: Abnormal   Collection Time: 04/05/21  3:52 AM  Result Value Ref Range   Sodium 138 135 - 145 mmol/L   Potassium 3.5 3.5 - 5.1 mmol/L   Chloride 107 98 - 111 mmol/L   CO2 25 22 - 32 mmol/L   Glucose, Bld 110 (H) 70 - 99 mg/dL   BUN 21 8 - 23 mg/dL   Creatinine, Ser 0.88 0.44 - 1.00 mg/dL   Calcium 8.7 (L) 8.9 - 10.3 mg/dL   GFR, Estimated >60 >60 mL/min   Anion gap 6 5 - 15  CBC with Differential/Platelet     Status: Abnormal   Collection Time: 04/05/21  3:52 AM  Result Value Ref Range   WBC 9.3 4.0 - 10.5 K/uL   RBC 4.05 3.87 - 5.11 MIL/uL   Hemoglobin 11.9 (L) 12.0 - 15.0 g/dL   HCT 36.6 36.0 - 46.0 %   MCV 90.4 80.0 - 100.0 fL   MCH 29.4 26.0 - 34.0 pg   MCHC 32.5 30.0 - 36.0 g/dL   RDW 15.4 11.5 - 15.5 %   Platelets 377 150 - 400 K/uL   nRBC 0.0 0.0 - 0.2 %   Neutrophils Relative % 59 %   Neutro Abs 5.5 1.7 - 7.7 K/uL   Lymphocytes Relative 31 %   Lymphs Abs 2.9 0.7 - 4.0 K/uL   Monocytes Relative 8 %   Monocytes Absolute 0.8 0.1  - 1.0 K/uL   Eosinophils Relative 1 %   Eosinophils Absolute 0.1 0.0 - 0.5 K/uL   Basophils Relative 0 %   Basophils Absolute 0.0 0.0 - 0.1 K/uL   Immature Granulocytes 1 %   Abs Immature Granulocytes 0.05 0.00 - 0.07 K/uL  Magnesium     Status: None   Collection Time: 04/05/21  3:52 AM  Result Value Ref Range   Magnesium 1.9 1.7 - 2.4 mg/dL  Procalcitonin - Baseline     Status: None   Collection Time: 04/05/21  3:52 AM  Result Value Ref Range   Procalcitonin <0.10 ng/mL  Glucose, capillary     Status: Abnormal   Collection Time: 04/05/21  7:36 AM  Result Value Ref Range   Glucose-Capillary 104 (H) 70 - 99 mg/dL  Glucose, capillary     Status: Abnormal   Collection Time: 04/05/21 12:20 PM  Result Value Ref Range   Glucose-Capillary 106 (H) 70 - 99 mg/dL    Recent Results (from the past 240 hour(s))  Resp Panel by RT-PCR (Flu A&B, Covid) Nasopharyngeal Swab     Status: None   Collection Time: 03/31/21  7:59 PM   Specimen: Nasopharyngeal Swab; Nasopharyngeal(NP) swabs in vial transport medium  Result Value Ref Range Status   SARS Coronavirus 2 by RT PCR NEGATIVE NEGATIVE Final    Comment: (NOTE) SARS-CoV-2 target nucleic acids are NOT DETECTED.  The SARS-CoV-2 RNA is generally detectable in upper respiratory specimens during the acute phase of infection. The lowest concentration of SARS-CoV-2 viral copies this assay can detect is 138 copies/mL. A negative result does not preclude SARS-Cov-2 infection and should not be used as the sole basis for treatment or other patient management decisions. A negative result may occur with  improper specimen collection/handling, submission of specimen other than nasopharyngeal swab, presence of viral mutation(s) within the areas targeted by this assay, and inadequate number of viral copies(<138 copies/mL). A negative result must be combined with clinical observations, patient history, and epidemiological information. The expected result is  Negative.  Fact Sheet for Patients:  EntrepreneurPulse.com.au  Fact Sheet for Healthcare Providers:  IncredibleEmployment.be  This test is no t yet approved or cleared by the [place] FDA and  has been authorized for detection and/or diagnosis of SARS-CoV-2 by FDA under an Emergency Use Authorization (EUA). This EUA will remain  in effect (meaning this test can be used) for the duration of the COVID-19 declaration under Section 564(b)(1) of the Act, 21 U.S.C.section 360bbb-3(b)(1), unless the authorization is terminated  or revoked sooner.       Influenza A by PCR NEGATIVE NEGATIVE Final   Influenza B by PCR NEGATIVE NEGATIVE Final    Comment: (NOTE) The Xpert Xpress SARS-CoV-2/FLU/RSV plus assay is intended as an aid in the diagnosis of influenza from Nasopharyngeal swab specimens and should not be used as a sole basis for treatment. Nasal washings and aspirates are unacceptable for Xpert Xpress SARS-CoV-2/FLU/RSV testing.  Fact Sheet for Patients: EntrepreneurPulse.com.au  Fact Sheet for Healthcare Providers: IncredibleEmployment.be  This test is not yet approved or cleared by the [place] FDA and has been authorized for detection and/or diagnosis of SARS-CoV-2 by FDA under an Emergency Use Authorization (EUA). This EUA will remain in effect (meaning this test can be used) for the duration of the COVID-19 declaration under Section 564(b)(1) of the Act, 21 U.S.C. section 360bbb-3(b)(1), unless the authorization is terminated or revoked.  Performed at [facility], [address] [place], [place] [address]      Radiology Studies: DG CHEST PORT 1 VIEW  Result Date: 04/05/2021 CLINICAL DATA:  Cough for 3 days. Pneumonia 1 month ago. History of hypertension and diabetes. EXAM: PORTABLE CHEST 1 VIEW COMPARISON:  Radiographs 03/31/2021 and 11/14/2020.  CT 11/07/2017.  FINDINGS: 1040 hours. Lower lung volumes. Allowing for this, the heart size and mediastinal contours are stable with aortic atherosclerosis. The lungs are clear. There is no pleural effusion or pneumothorax. Old fractures of the distal right clavicle and left ribs are noted. IMPRESSION: Suboptimal inspiration. No active cardiopulmonary process demonstrated. Electronically Signed   By: Richardean Sale M.D.   On: 04/05/2021 13:31   DG CHEST PORT 1 VIEW  Final Result  MR CARDIAC MORPHOLOGY W WO CONTRAST  Final Result    VAS US CAROTID  Final Result    CT Head Wo Contrast  Final Result    CT Cervical Spine Wo Contrast  Final Result    CT Maxillofacial WO CM  Final Result    DG Ribs Unilateral W/Chest Left  Final Result      Scheduled Meds:  amLODipine  10 mg Oral Daily   carvedilol  9.375 mg Oral BID WC   dextromethorphan-guaiFENesin  1 tablet Oral BID   fexofenadine  180 mg Oral Daily   pantoprazole  40 mg Oral BID   rosuvastatin  20 mg Oral Daily   sucralfate  1 g Oral TID WC & HS   PRN Meds: acetaminophen **OR** acetaminophen, ALPRAZolam, alum & mag hydroxide-simeth, hydrALAZINE, lip balm, prochlorperazine, promethazine (PHENERGAN) injection (IM or IVPB) Continuous Infusions:  promethazine (PHENERGAN) injection (IM or IVPB)       LOS: 2 days  Time spent: Greater than 50% of the 35 minute visit was spent in counseling/coordination of care for the patient as laid out in the A&P.   Dwyane Dee, MD [facility] 04/05/2021, 3:43 PM

## 2021-04-05 NOTE — TOC Progression Note (Signed)
Transition of Care [facility]) - Progression Note    Patient Details  Name: Anna Hoffman MRN: [id number] Date of Birth: October 29, 1934  [facility] CM/SW Contact  Purcell Mouton, RN Phone Number: 04/05/2021, 2:34 PM  Clinical Narrative:    Pt agreed with going to SNF for Rehab at present time. Will need SNF work-up.         Expected Discharge Plan and Services                                                 Social Determinants of Health (SDOH) Interventions    Readmission Risk Interventions No flowsheet data found.

## 2021-04-06 LAB — BASIC METABOLIC PANEL
Anion gap: 4 — ABNORMAL LOW (ref 5–15)
BUN: 18 mg/dL (ref 8–23)
CO2: 23 mmol/L (ref 22–32)
Calcium: 8.5 mg/dL — ABNORMAL LOW (ref 8.9–10.3)
Chloride: 108 mmol/L (ref 98–111)
Creatinine, Ser: 0.79 mg/dL (ref 0.44–1.00)
GFR, Estimated: >60 mL/min (ref >60)
Glucose, Bld: 104 mg/dL — ABNORMAL HIGH (ref 70–99)
Potassium: 3.5 mmol/L (ref 3.5–5.1)
Sodium: 135 mmol/L (ref 135–145)

## 2021-04-06 LAB — CBC WITH DIFFERENTIAL/PLATELET
Abs Immature Granulocytes: 0.03 10*3/uL (ref 0.00–0.07)
Basophils Absolute: 0 10*3/uL (ref 0.0–0.1)
Basophils Relative: 0 %
Eosinophils Absolute: 0.2 10*3/uL (ref 0.0–0.5)
Eosinophils Relative: 3 %
HCT: 36.2 % (ref 36.0–46.0)
Hemoglobin: 11.9 g/dL — ABNORMAL LOW (ref 12.0–15.0)
Immature Granulocytes: 0 %
Lymphocytes Relative: 32 %
Lymphs Abs: 2.5 10*3/uL (ref 0.7–4.0)
MCH: 29.2 pg (ref 26.0–34.0)
MCHC: 32.9 g/dL (ref 30.0–36.0)
MCV: 88.7 fL (ref 80.0–100.0)
Monocytes Absolute: 0.7 10*3/uL (ref 0.1–1.0)
Monocytes Relative: 10 %
Neutro Abs: 4.2 10*3/uL (ref 1.7–7.7)
Neutrophils Relative %: 55 %
Platelets: 412 10*3/uL — ABNORMAL HIGH (ref 150–400)
RBC: 4.08 MIL/uL (ref 3.87–5.11)
RDW: 14.8 % (ref 11.5–15.5)
WBC: 7.6 10*3/uL (ref 4.0–10.5)
nRBC: 0 % (ref 0.0–0.2)

## 2021-04-06 LAB — MAGNESIUM: Magnesium: 2 mg/dL (ref 1.7–2.4)

## 2021-04-06 MED ORDER — DICLOFENAC SODIUM 1 % EX GEL
4.0000 g | Freq: Four times a day (QID) | CUTANEOUS | Status: DC | PRN
  Administered 2021-04-07: 4 g via TOPICAL
  Filled 2021-04-06: qty 100

## 2021-04-06 NOTE — Progress Notes (Signed)
Patient negative for Covid, Flu A&B, respiratory panel completely negative, droplet precautions discontinued as per protocol.

## 2021-04-06 NOTE — Progress Notes (Signed)
Progress Note    Anna Hoffman   GDJ:[id number]  DOB: 12-28-1934  DOA: 03/31/2021     3  PCP: Glendale Chard, MD  Initial CC: fall/syncope  Hospital Course: Anna Hoffman is an 86 yo female with PMH DMII, HTN, CVA, arthritis who presented after a syncopal fall at home.  She does not remember the episode and it was reported that the event lasted less than 1 minute.  She was found by her son on the floor after they heard her fall.  She was admitted for further workup.   Interval History:  Continues to have improvement with no further nausea or vomiting.  Cough has also improved as well as her energy level.  She was hopeful for going home at discharge however stated to her she is still rather weak and deconditioned, needing rehab.  Also called and discussed with her son, [name] informing him that rehab is still the recommendation as well.  ROS: Constitutional: negative for chills and fevers, Respiratory: negative for cough, Cardiovascular: negative for chest pain, and Gastrointestinal: negative for abdominal pain  Assessment & Plan: * Syncope and collapse-resolved as of 04/04/2021 - patient denied prior history of similar but description of event is consistent with LOC - etiology presumed cardiac with further workup required. She has a history of AV block and also reports some CP when she's exerting herself at times. Echo shows severe focal basal septal LVH; cardiac MRI consistent with HCM - orthostatics negative and repeated; remained negative - cardiology consulted for further assistance given prior findings - troponin negative on 8/31  Hypertrophic cardiomyopathy ([place]) - confirmed on cardiac MRI - possible contribution to syncope if she was also dehydrated - coreg started per cardiology - s/p re-hydration with IVF; continue encouraging oral intake - Zio patch to be mailed   Cough - differential includes GERD vs URI that is developing - CXR obtained on 9/2 is clear - continue  allergy meds; added mucinex - check RVP swab = negative -Symptoms improved on 04/06/2021  Physical deconditioning - patient appears to be deconditioned at this point; trouble sitting upright in bed - PT eval; recommended SNF, patient agreeable    Benign essential HTN - Negative orthostatics - continue amlodipine and coreg  Nausea and vomiting-resolved as of 04/06/2021 - began overnight of 8/30 (was given 2 doses decadron IV as well); past GI notes reviewed (hx Grade C esophagitis s/p EGD 10/2020) - she responded well to BID protonix and carafate in the past; these have been resumed  -Symptoms have also improved since starting treatment  Ascending aorta dilatation ([name]) - noted on echo on 04/01/21 - measures 41 mm - repeat echo in 1 year per cardiology   Thyroid nodule - Incidental right thyroid nodule noted on CT.  Measures 20 mm. -Nonemergent ultrasound recommended.  May be done outpatient  Fall at home, initial encounter - presumed from a syncopal event as she has no prodrome symptoms and no recollection - CTH, CT-C spine, and CT maxillofacial are negative for acute abnormalities or fracture - Rib xray non-diagnostic, possible left sided rib fx; treatment still supportive care   Chronic kidney disease, stage 3a ([place]) - patient has history of CKD3a. Baseline creat ~ 1, eGFR 50  Mixed hyperlipidemia - Continue statin  Type 2 diabetes mellitus with stage 3 chronic kidney disease, without long-term current use of insulin ([place]) - last A1c 6.2% on 02/20/21  Leukocytosis-resolved as of 04/02/2021 - Considered reactive on admission - Has normalized   Old  records reviewed in assessment of this patient  Antimicrobials:   DVT prophylaxis: SCDs Start: 03/31/21 1752   Code Status:   Code Status: Full Code Family Communication: son  Disposition Plan: Status is: Inpatient  Remains inpatient appropriate because:IV treatments appropriate due to intensity of illness or inability  to take PO and Inpatient level of care appropriate due to severity of illness  Dispo: The patient is from: Home              Anticipated d/c is to: SNF              Patient currently is not medically stable to d/c.   Difficult to place patient No  Risk of unplanned readmission score: Unplanned Admission- Pilot do not use: 22.46   Objective: Blood pressure (!) 157/97, pulse 84, temperature 98.5 F (36.9 C), temperature source Oral, resp. rate 20, height 5' 5.5" (1.664 m), weight 60.8 kg, SpO2 97 %.  Examination: General appearance: alert, cooperative, fatigued, and no distress Head: Normocephalic, without obvious abnormality, atraumatic Eyes:  EOMI Lungs: clear to auscultation bilaterally Heart: regular rate and rhythm and S1, S2 normal Abdomen: normal findings: bowel sounds normal and soft, non-tender Extremities:  no edema Skin: mobility and turgor normal Neurologic: Grossly normal  Consultants:  Cardiology  Procedures:    Data Reviewed: I have personally reviewed following labs and imaging studies Results for orders placed or performed during the hospital encounter of 03/31/21 (from the past 24 hour(s))  Respiratory (~20 pathogens) panel by PCR     Status: None   Collection Time: 04/05/21  2:55 PM   Specimen: Nasopharyngeal Swab; Respiratory  Result Value Ref Range   Adenovirus NOT DETECTED NOT DETECTED   Coronavirus 229E NOT DETECTED NOT DETECTED   Coronavirus HKU1 NOT DETECTED NOT DETECTED   Coronavirus NL63 NOT DETECTED NOT DETECTED   Coronavirus OC43 NOT DETECTED NOT DETECTED   Metapneumovirus NOT DETECTED NOT DETECTED   Rhinovirus / Enterovirus NOT DETECTED NOT DETECTED   Influenza A NOT DETECTED NOT DETECTED   Influenza B NOT DETECTED NOT DETECTED   Parainfluenza Virus 1 NOT DETECTED NOT DETECTED   Parainfluenza Virus 2 NOT DETECTED NOT DETECTED   Parainfluenza Virus 3 NOT DETECTED NOT DETECTED   Parainfluenza Virus 4 NOT DETECTED NOT DETECTED   Respiratory  Syncytial Virus NOT DETECTED NOT DETECTED   Bordetella pertussis NOT DETECTED NOT DETECTED   Bordetella Parapertussis NOT DETECTED NOT DETECTED   Chlamydophila pneumoniae NOT DETECTED NOT DETECTED   Mycoplasma pneumoniae NOT DETECTED NOT DETECTED  Glucose, capillary     Status: Abnormal   Collection Time: 04/05/21  5:17 PM  Result Value Ref Range   Glucose-Capillary 102 (H) 70 - 99 mg/dL  Glucose, capillary     Status: Abnormal   Collection Time: 04/05/21  8:00 PM  Result Value Ref Range   Glucose-Capillary 145 (H) 70 - 99 mg/dL  Basic metabolic panel     Status: Abnormal   Collection Time: 04/06/21  3:43 AM  Result Value Ref Range   Sodium 135 135 - 145 mmol/L   Potassium 3.5 3.5 - 5.1 mmol/L   Chloride 108 98 - 111 mmol/L   CO2 23 22 - 32 mmol/L   Glucose, Bld 104 (H) 70 - 99 mg/dL   BUN 18 8 - 23 mg/dL   Creatinine, Ser 0.79 0.44 - 1.00 mg/dL   Calcium 8.5 (L) 8.9 - 10.3 mg/dL   GFR, Estimated >60 >60 mL/min   Anion gap 4 (L)  5 - 15  CBC with Differential/Platelet     Status: Abnormal   Collection Time: 04/06/21  3:43 AM  Result Value Ref Range   WBC 7.6 4.0 - 10.5 K/uL   RBC 4.08 3.87 - 5.11 MIL/uL   Hemoglobin 11.9 (L) 12.0 - 15.0 g/dL   HCT 36.2 36.0 - 46.0 %   MCV 88.7 80.0 - 100.0 fL   MCH 29.2 26.0 - 34.0 pg   MCHC 32.9 30.0 - 36.0 g/dL   RDW 14.8 11.5 - 15.5 %   Platelets 412 (H) 150 - 400 K/uL   nRBC 0.0 0.0 - 0.2 %   Neutrophils Relative % 55 %   Neutro Abs 4.2 1.7 - 7.7 K/uL   Lymphocytes Relative 32 %   Lymphs Abs 2.5 0.7 - 4.0 K/uL   Monocytes Relative 10 %   Monocytes Absolute 0.7 0.1 - 1.0 K/uL   Eosinophils Relative 3 %   Eosinophils Absolute 0.2 0.0 - 0.5 K/uL   Basophils Relative 0 %   Basophils Absolute 0.0 0.0 - 0.1 K/uL   Immature Granulocytes 0 %   Abs Immature Granulocytes 0.03 0.00 - 0.07 K/uL  Magnesium     Status: None   Collection Time: 04/06/21  3:43 AM  Result Value Ref Range   Magnesium 2.0 1.7 - 2.4 mg/dL    Recent Results (from  the past 240 hour(s))  Resp Panel by RT-PCR (Flu A&B, Covid) Nasopharyngeal Swab     Status: None   Collection Time: 03/31/21  7:59 PM   Specimen: Nasopharyngeal Swab; Nasopharyngeal(NP) swabs in vial transport medium  Result Value Ref Range Status   SARS Coronavirus 2 by RT PCR NEGATIVE NEGATIVE Final    Comment: (NOTE) SARS-CoV-2 target nucleic acids are NOT DETECTED.  The SARS-CoV-2 RNA is generally detectable in upper respiratory specimens during the acute phase of infection. The lowest concentration of SARS-CoV-2 viral copies this assay can detect is 138 copies/mL. A negative result does not preclude SARS-Cov-2 infection and should not be used as the sole basis for treatment or other patient management decisions. A negative result may occur with  improper specimen collection/handling, submission of specimen other than nasopharyngeal swab, presence of viral mutation(s) within the areas targeted by this assay, and inadequate number of viral copies(<138 copies/mL). A negative result must be combined with clinical observations, patient history, and epidemiological information. The expected result is Negative.  Fact Sheet for Patients:  EntrepreneurPulse.com.au  Fact Sheet for Healthcare Providers:  IncredibleEmployment.be  This test is no t yet approved or cleared by the [place] FDA and  has been authorized for detection and/or diagnosis of SARS-CoV-2 by FDA under an Emergency Use Authorization (EUA). This EUA will remain  in effect (meaning this test can be used) for the duration of the COVID-19 declaration under Section 564(b)(1) of the Act, 21 U.S.C.section 360bbb-3(b)(1), unless the authorization is terminated  or revoked sooner.       Influenza A by PCR NEGATIVE NEGATIVE Final   Influenza B by PCR NEGATIVE NEGATIVE Final    Comment: (NOTE) The Xpert Xpress SARS-CoV-2/FLU/RSV plus assay is intended as an aid in the diagnosis of  influenza from Nasopharyngeal swab specimens and should not be used as a sole basis for treatment. Nasal washings and aspirates are unacceptable for Xpert Xpress SARS-CoV-2/FLU/RSV testing.  Fact Sheet for Patients: EntrepreneurPulse.com.au  Fact Sheet for Healthcare Providers: IncredibleEmployment.be  This test is not yet approved or cleared by the [place] FDA and has been  authorized for detection and/or diagnosis of SARS-CoV-2 by FDA under an Emergency Use Authorization (EUA). This EUA will remain in effect (meaning this test can be used) for the duration of the COVID-19 declaration under Section 564(b)(1) of the Act, 21 U.S.C. section 360bbb-3(b)(1), unless the authorization is terminated or revoked.  Performed at [facility], [place] [address] [place], [place] [address]   Respiratory (~20 pathogens) panel by PCR     Status: None   Collection Time: 04/05/21  2:55 PM   Specimen: Nasopharyngeal Swab; Respiratory  Result Value Ref Range Status   Adenovirus NOT DETECTED NOT DETECTED Final   Coronavirus 229E NOT DETECTED NOT DETECTED Final    Comment: (NOTE) The Coronavirus on the Respiratory Panel, DOES NOT test for the novel  Coronavirus (2019 nCoV)    Coronavirus HKU1 NOT DETECTED NOT DETECTED Final   Coronavirus NL63 NOT DETECTED NOT DETECTED Final   Coronavirus OC43 NOT DETECTED NOT DETECTED Final   Metapneumovirus NOT DETECTED NOT DETECTED Final   Rhinovirus / Enterovirus NOT DETECTED NOT DETECTED Final   Influenza A NOT DETECTED NOT DETECTED Final   Influenza B NOT DETECTED NOT DETECTED Final   Parainfluenza Virus 1 NOT DETECTED NOT DETECTED Final   Parainfluenza Virus 2 NOT DETECTED NOT DETECTED Final   Parainfluenza Virus 3 NOT DETECTED NOT DETECTED Final   Parainfluenza Virus 4 NOT DETECTED NOT DETECTED Final   Respiratory Syncytial Virus NOT DETECTED NOT DETECTED Final   Bordetella pertussis NOT DETECTED  NOT DETECTED Final   Bordetella Parapertussis NOT DETECTED NOT DETECTED Final   Chlamydophila pneumoniae NOT DETECTED NOT DETECTED Final   Mycoplasma pneumoniae NOT DETECTED NOT DETECTED Final    Comment: Performed at [facility], [place]. [address], [place], [place] [address]     Radiology Studies: DG CHEST PORT 1 VIEW  Result Date: 04/05/2021 CLINICAL DATA:  Cough for 3 days. Pneumonia 1 month ago. History of hypertension and diabetes. EXAM: PORTABLE CHEST 1 VIEW COMPARISON:  Radiographs 03/31/2021 and 11/14/2020.  CT 11/07/2017. FINDINGS: 1040 hours. Lower lung volumes. Allowing for this, the heart size and mediastinal contours are stable with aortic atherosclerosis. The lungs are clear. There is no pleural effusion or pneumothorax. Old fractures of the distal right clavicle and left ribs are noted. IMPRESSION: Suboptimal inspiration. No active cardiopulmonary process demonstrated. Electronically Signed   By: Richardean Sale M.D.   On: 04/05/2021 13:31   DG CHEST PORT 1 VIEW  Final Result    MR CARDIAC MORPHOLOGY W WO CONTRAST  Final Result    VAS US CAROTID  Final Result    CT Head Wo Contrast  Final Result    CT Cervical Spine Wo Contrast  Final Result    CT Maxillofacial WO CM  Final Result    DG Ribs Unilateral W/Chest Left  Final Result      Scheduled Meds:  amLODipine  10 mg Oral Daily   carvedilol  9.375 mg Oral BID WC   dextromethorphan-guaiFENesin  1 tablet Oral BID   fexofenadine  180 mg Oral Daily   pantoprazole  40 mg Oral BID   rosuvastatin  20 mg Oral Daily   sucralfate  1 g Oral TID WC & HS   PRN Meds: acetaminophen **OR** acetaminophen, ALPRAZolam, alum & mag hydroxide-simeth, hydrALAZINE, lip balm, prochlorperazine, promethazine (PHENERGAN) injection (IM or IVPB) Continuous Infusions:  promethazine (PHENERGAN) injection (IM or IVPB)       LOS: 3 days  Time spent: Greater than 50%  of the 35 minute visit was spent in counseling/coordination of care  for the patient as laid out in the A&P.   Dwyane Dee, MD [facility] 04/06/2021, 2:02 PM

## 2021-04-06 NOTE — Progress Notes (Signed)
Son, [name], called for update.  Discussed that patient is requiring a strong standby assist at all times for safety (up to [facility], up to chair, etc).  Son says he is not home at all times and understands the need for rehab.  Discussed that from the SW notes FL2 has been sent out on the patient and we are awaiting bed offers at this time.

## 2021-04-06 NOTE — TOC Transition Note (Signed)
Transition of Care [facility]) - CM/SW Discharge Note   Patient Details  Name: Amillion Scobee MRN: [id number] Date of Birth: May 22, 1935  [facility] of [facility]) CM/SW Contact:  Normal Recinos, Marta Lamas, LCSW Phone Number: 04/06/2021, 3:17 PM   Clinical Narrative:      [facility] skilled [facility] facility that has provided patient with a bed offer, thus far.  A HIPAA compliant message was left on voicemail for son, Thayer Dallas to discuss the bed offer and discharge disposition.  LCSW is awaiting a return call.  Patient Goals and CMS Choice    N/A    Discharge Placement               SNF        Discharge Plan and Services      SNF        Social Determinants of Health (SDOH) Interventions   N/A  Readmission Risk Interventions No flowsheet data found.  Nat Christen, BSW, MSW, [employer]  Licensed Holiday representative  [employer]  Mailing Address-[address], [place], [place] [address] Physical Address-[address], [place],  [address] Toll Free Main # [phone] Fax # [phone] Cell # [phone]  [email or web address]

## 2021-04-07 DIAGNOSIS — I421 Obstructive hypertrophic cardiomyopathy: Principal | ICD-10-CM

## 2021-04-07 LAB — COMPREHENSIVE METABOLIC PANEL
ALT: 17 U/L (ref 0–44)
AST: 18 U/L (ref 15–41)
Albumin: 3.5 g/dL (ref 3.5–5.0)
Alkaline Phosphatase: 52 U/L (ref 38–126)
Anion gap: 10 (ref 5–15)
BUN: 20 mg/dL (ref 8–23)
CO2: 18 mmol/L — ABNORMAL LOW (ref 22–32)
Calcium: 9.1 mg/dL (ref 8.9–10.3)
Chloride: 107 mmol/L (ref 98–111)
Creatinine, Ser: 1.04 mg/dL — ABNORMAL HIGH (ref 0.44–1.00)
GFR, Estimated: 53 mL/min — ABNORMAL LOW (ref >60)
Glucose, Bld: 156 mg/dL — ABNORMAL HIGH (ref 70–99)
Potassium: 3.5 mmol/L (ref 3.5–5.1)
Sodium: 135 mmol/L (ref 135–145)
Total Bilirubin: 0.7 mg/dL (ref 0.3–1.2)
Total Protein: 6.7 g/dL (ref 6.5–8.1)

## 2021-04-07 LAB — TROPONIN I (HIGH SENSITIVITY): Troponin I (High Sensitivity): 15 ng/L (ref <18)

## 2021-04-07 LAB — CBC
HCT: 39 % (ref 36.0–46.0)
Hemoglobin: 12.8 g/dL (ref 12.0–15.0)
MCH: 29.1 pg (ref 26.0–34.0)
MCHC: 32.8 g/dL (ref 30.0–36.0)
MCV: 88.6 fL (ref 80.0–100.0)
Platelets: 421 10*3/uL — ABNORMAL HIGH (ref 150–400)
RBC: 4.4 MIL/uL (ref 3.87–5.11)
RDW: 15 % (ref 11.5–15.5)
WBC: 10.7 10*3/uL — ABNORMAL HIGH (ref 4.0–10.5)
nRBC: 0 % (ref 0.0–0.2)

## 2021-04-07 LAB — MAGNESIUM: Magnesium: 2 mg/dL (ref 1.7–2.4)

## 2021-04-07 LAB — LACTIC ACID, PLASMA
Lactic Acid, Venous: 1.6 mmol/L (ref 0.5–1.9)
Lactic Acid, Venous: 1.3 mmol/L (ref 0.5–1.9)

## 2021-04-07 LAB — PHOSPHORUS: Phosphorus: 3.1 mg/dL (ref 2.5–4.6)

## 2021-04-07 LAB — GLUCOSE, CAPILLARY: Glucose-Capillary: 163 mg/dL — ABNORMAL HIGH (ref 70–99)

## 2021-04-07 MED ORDER — SODIUM CHLORIDE 0.9 % IV SOLN: INTRAVENOUS | Status: AC

## 2021-04-07 MED ORDER — METOPROLOL TARTRATE 50 MG PO TABS
50.0000 mg | ORAL_TABLET | Freq: Two times a day (BID) | ORAL | Status: DC
  Administered 2021-04-07 – 2021-04-09 (×4): 50 mg via ORAL
  Filled 2021-04-07 (×5): qty 1

## 2021-04-07 MED FILL — Medication: Qty: 1 | Status: AC

## 2021-04-07 NOTE — Progress Notes (Addendum)
RN assisted pt to bedside commode for BM. Pt became nauseous. RN attempted to give compazine. IV leaking. RN noted pt became unresponsive, HR in the 20s on telemetry.  2 RNs placed pt in bed. Code blue called.  Crash cart brought to room.  Physician, multiple members of code team at bedside. At this point, HR began trending up, pt became more responsive.

## 2021-04-07 NOTE — Progress Notes (Signed)
RN notified by CCMD of two pauses at 3 seconds each.  MD notified.

## 2021-04-07 NOTE — Progress Notes (Signed)
Progress Note    Baelynn Schmuhl   TWK:[id number]  DOB: September 05, 1934  DOA: 03/31/2021     4  PCP: Glendale Chard, MD  Initial CC: fall/syncope  Hospital Course: Ms. Gardenhire is an 86 yo female with PMH DMII, HTN, CVA, arthritis who presented after a syncopal fall at home.  She does not remember the episode and it was reported that the event lasted less than 1 minute.  She was found by her son on the floor after they heard her fall.  She was admitted for further workup.   Interval History:  Saw patient in her normal state of health earlier this morning.  She was conversant and in no distress still wanting to go home but explained to her the need for rehab.  Vitals were also stable.  Later in the morning around 1000 there was a CODE BLUE called. I was immediately present as I was on the floor rounding.  She was on the bedside commode with nursing staff and then became unresponsive and passed out.  She was carried to the bed and regained consciousness just as staff started compressions.  She was noted to be sinus bradycardia on bedside telemetry with rate in the 40s.  While an IV was being placed as hers was dislodged when getting her to the bed from the commode, atropine was ordered but her heart rate began to slowly improve and no medications were required. I remained present while she stabilized. Labs were drawn and repeated this morning. I have reviewed them and they are reassuring (mild metabolic acidosis but not unexpected). I ordered IVF during this event as it was also presumed that she had some outflow obstruction when on the commode with the possibility of some vagal tone contributing.  She had no focal deficits on my exam. She was confused and mentation was slow as she was coming back around.  I checked on her again in the morning around 1145 am as her son [name] had arrived too.  Her mentation was continuing to improve at that time as well.  Still no focal deficits on exam.  Questions were  answered from [place]. Of note, I also called her son [name] after this event as well as [name] (whom also was already enroute to visit).   ROS: Constitutional: negative for chills and fevers, Respiratory: negative for cough, Cardiovascular: negative for chest pain, and Gastrointestinal: negative for abdominal pain  Assessment & Plan: * Syncope and collapse-resolved as of 04/04/2021 - patient denied prior history of similar but description of event is consistent with LOC - etiology presumed cardiac with further workup required. She has a history of AV block and also reports some CP when she's exerting herself at times. Echo shows severe focal basal septal LVH; cardiac MRI consistent with HCM - orthostatics negative and repeated; remained negative - cardiology consulted for further assistance given prior findings - troponin negative on 8/31  HOCM (hypertrophic obstructive cardiomyopathy) ([place]) - confirmed on cardiac MRI - possible contribution to syncope if she was also dehydrated - vasovagal/syncopal/bradycardic event on 9/4 in the morning; cardiology notified and meds adjusted; event is considered due to her narrow outflow tract in conjunction with presumed straining on the commode - coreg transitioned to metoprolol and PRN verapamil - d/c'd amlodipine per cardiology - Zio patch to be mailed   Physical deconditioning - patient appears to be deconditioned at this point; trouble sitting upright in bed - PT eval; recommended SNF, patient agreeable    Benign essential  HTN - Negative orthostatics - continue BP meds as above   Cough-resolved as of 04/07/2021 - differential includes GERD vs allergies vs URI that is developing - CXR obtained on 9/2 is clear - continue allergy meds; added mucinex - check RVP swab = negative -Symptoms improved on 04/06/2021  Nausea and vomiting-resolved as of 04/06/2021 - began overnight of 8/30 (was given 2 doses decadron IV as well); past GI notes reviewed (hx  Grade C esophagitis s/p EGD 10/2020) - she responded well to BID protonix and carafate in the past; these have been resumed  -Symptoms have also improved since starting treatment  Ascending aorta dilatation ([place]) - noted on echo on 04/01/21 - measures 41 mm - repeat echo in 1 year per cardiology   Thyroid nodule - Incidental right thyroid nodule noted on CT.  Measures 20 mm. -Nonemergent ultrasound recommended.  May be done outpatient  Fall at home, initial encounter - presumed from a syncopal event as she has no prodrome symptoms and no recollection - CTH, CT-C spine, and CT maxillofacial are negative for acute abnormalities or fracture - Rib xray non-diagnostic, possible left sided rib fx; treatment still supportive care   Chronic kidney disease, stage 3a ([place]) - patient has history of CKD3a. Baseline creat ~ 1, eGFR 50  Mixed hyperlipidemia - Continue statin  Type 2 diabetes mellitus with stage 3 chronic kidney disease, without long-term current use of insulin ([name]) - last A1c 6.2% on 02/20/21  Leukocytosis-resolved as of 04/02/2021 - Considered reactive on admission - Has normalized   Old records reviewed in assessment of this patient  Antimicrobials:   DVT prophylaxis: SCDs Start: 03/31/21 1752   Code Status:   Code Status: Full Code Family Communication: sons  Disposition Plan: Status is: Inpatient  Remains inpatient appropriate because:IV treatments appropriate due to intensity of illness or inability to take PO and Inpatient level of care appropriate due to severity of illness  Dispo: The patient is from: Home              Anticipated d/c is to: SNF              Patient currently is not medically stable to d/c.   Difficult to place patient No  Risk of unplanned readmission score: Unplanned Admission- Pilot do not use: 22.94   Objective: Blood pressure 131/77, pulse 70, temperature 97.9 F (36.6 C), temperature source Oral, resp. rate 14, height 5' 5.5"  (1.664 m), weight 60.8 kg, SpO2 94 %.  Examination: General appearance: alert, cooperative, fatigued, and no distress Head: Normocephalic, without obvious abnormality, atraumatic Eyes:  EOMI Lungs: clear to auscultation bilaterally Heart: regular rate and rhythm and S1, S2 normal Abdomen: normal findings: bowel sounds normal and soft, non-tender Extremities:  no edema Skin: mobility and turgor normal Neurologic: Grossly normal  Consultants:  Cardiology  Procedures:    Data Reviewed: I have personally reviewed following labs and imaging studies Results for orders placed or performed during the hospital encounter of 03/31/21 (from the past 24 hour(s))  Glucose, capillary     Status: Abnormal   Collection Time: 04/07/21  9:54 AM  Result Value Ref Range   Glucose-Capillary 163 (H) 70 - 99 mg/dL  Comprehensive metabolic panel     Status: Abnormal   Collection Time: 04/07/21  9:58 AM  Result Value Ref Range   Sodium 135 135 - 145 mmol/L   Potassium 3.5 3.5 - 5.1 mmol/L   Chloride 107 98 - 111 mmol/L   CO2  18 (L) 22 - 32 mmol/L   Glucose, Bld 156 (H) 70 - 99 mg/dL   BUN 20 8 - 23 mg/dL   Creatinine, Ser 1.04 (H) 0.44 - 1.00 mg/dL   Calcium 9.1 8.9 - 10.3 mg/dL   Total Protein 6.7 6.5 - 8.1 g/dL   Albumin 3.5 3.5 - 5.0 g/dL   AST 18 15 - 41 U/L   ALT 17 0 - 44 U/L   Alkaline Phosphatase 52 38 - 126 U/L   Total Bilirubin 0.7 0.3 - 1.2 mg/dL   GFR, Estimated 53 (L) >60 mL/min   Anion gap 10 5 - 15  CBC     Status: Abnormal   Collection Time: 04/07/21  9:58 AM  Result Value Ref Range   WBC 10.7 (H) 4.0 - 10.5 K/uL   RBC 4.40 3.87 - 5.11 MIL/uL   Hemoglobin 12.8 12.0 - 15.0 g/dL   HCT 39.0 36.0 - 46.0 %   MCV 88.6 80.0 - 100.0 fL   MCH 29.1 26.0 - 34.0 pg   MCHC 32.8 30.0 - 36.0 g/dL   RDW 15.0 11.5 - 15.5 %   Platelets 421 (H) 150 - 400 K/uL   nRBC 0.0 0.0 - 0.2 %  Magnesium     Status: None   Collection Time: 04/07/21  9:58 AM  Result Value Ref Range   Magnesium 2.0  1.7 - 2.4 mg/dL  Lactic acid, plasma     Status: None   Collection Time: 04/07/21  9:58 AM  Result Value Ref Range   Lactic Acid, Venous 1.6 0.5 - 1.9 mmol/L  Troponin I (High Sensitivity)     Status: None   Collection Time: 04/07/21  9:58 AM  Result Value Ref Range   Troponin I (High Sensitivity) 15 <18 ng/L  Phosphorus     Status: None   Collection Time: 04/07/21  9:58 AM  Result Value Ref Range   Phosphorus 3.1 2.5 - 4.6 mg/dL    Recent Results (from the past 240 hour(s))  Resp Panel by RT-PCR (Flu A&B, Covid) Nasopharyngeal Swab     Status: None   Collection Time: 03/31/21  7:59 PM   Specimen: Nasopharyngeal Swab; Nasopharyngeal(NP) swabs in vial transport medium  Result Value Ref Range Status   SARS Coronavirus 2 by RT PCR NEGATIVE NEGATIVE Final    Comment: (NOTE) SARS-CoV-2 target nucleic acids are NOT DETECTED.  The SARS-CoV-2 RNA is generally detectable in upper respiratory specimens during the acute phase of infection. The lowest concentration of SARS-CoV-2 viral copies this assay can detect is 138 copies/mL. A negative result does not preclude SARS-Cov-2 infection and should not be used as the sole basis for treatment or other patient management decisions. A negative result may occur with  improper specimen collection/handling, submission of specimen other than nasopharyngeal swab, presence of viral mutation(s) within the areas targeted by this assay, and inadequate number of viral copies(<138 copies/mL). A negative result must be combined with clinical observations, patient history, and epidemiological information. The expected result is Negative.  Fact Sheet for Patients:  EntrepreneurPulse.com.au  Fact Sheet for Healthcare Providers:  IncredibleEmployment.be  This test is no t yet approved or cleared by the [place] FDA and  has been authorized for detection and/or diagnosis of SARS-CoV-2 by FDA under an Emergency Use  Authorization (EUA). This EUA will remain  in effect (meaning this test can be used) for the duration of the COVID-19 declaration under Section 564(b)(1) of the Act, 21 U.S.C.section 360bbb-3(b)(1), unless  the authorization is terminated  or revoked sooner.       Influenza A by PCR NEGATIVE NEGATIVE Final   Influenza B by PCR NEGATIVE NEGATIVE Final    Comment: (NOTE) The Xpert Xpress SARS-CoV-2/FLU/RSV plus assay is intended as an aid in the diagnosis of influenza from Nasopharyngeal swab specimens and should not be used as a sole basis for treatment. Nasal washings and aspirates are unacceptable for Xpert Xpress SARS-CoV-2/FLU/RSV testing.  Fact Sheet for Patients: EntrepreneurPulse.com.au  Fact Sheet for Healthcare Providers: IncredibleEmployment.be  This test is not yet approved or cleared by the [place] FDA and has been authorized for detection and/or diagnosis of SARS-CoV-2 by FDA under an Emergency Use Authorization (EUA). This EUA will remain in effect (meaning this test can be used) for the duration of the COVID-19 declaration under Section 564(b)(1) of the Act, 21 U.S.C. section 360bbb-3(b)(1), unless the authorization is terminated or revoked.  Performed at [facility], [place] [address], [place], [place] [address]   Respiratory (~20 pathogens) panel by PCR     Status: None   Collection Time: 04/05/21  2:55 PM   Specimen: Nasopharyngeal Swab; Respiratory  Result Value Ref Range Status   Adenovirus NOT DETECTED NOT DETECTED Final   Coronavirus 229E NOT DETECTED NOT DETECTED Final    Comment: (NOTE) The Coronavirus on the Respiratory Panel, DOES NOT test for the novel  Coronavirus (2019 nCoV)    Coronavirus HKU1 NOT DETECTED NOT DETECTED Final   Coronavirus NL63 NOT DETECTED NOT DETECTED Final   Coronavirus OC43 NOT DETECTED NOT DETECTED Final   Metapneumovirus NOT DETECTED NOT DETECTED Final    Rhinovirus / Enterovirus NOT DETECTED NOT DETECTED Final   Influenza A NOT DETECTED NOT DETECTED Final   Influenza B NOT DETECTED NOT DETECTED Final   Parainfluenza Virus 1 NOT DETECTED NOT DETECTED Final   Parainfluenza Virus 2 NOT DETECTED NOT DETECTED Final   Parainfluenza Virus 3 NOT DETECTED NOT DETECTED Final   Parainfluenza Virus 4 NOT DETECTED NOT DETECTED Final   Respiratory Syncytial Virus NOT DETECTED NOT DETECTED Final   Bordetella pertussis NOT DETECTED NOT DETECTED Final   Bordetella Parapertussis NOT DETECTED NOT DETECTED Final   Chlamydophila pneumoniae NOT DETECTED NOT DETECTED Final   Mycoplasma pneumoniae NOT DETECTED NOT DETECTED Final    Comment: Performed at [facility], [place] [address], [place], [place] [address]     Radiology Studies: No results found. DG CHEST PORT 1 VIEW  Final Result    MR CARDIAC MORPHOLOGY W WO CONTRAST  Final Result    VAS US CAROTID  Final Result    CT Head Wo Contrast  Final Result    CT Cervical Spine Wo Contrast  Final Result    CT Maxillofacial WO CM  Final Result    DG Ribs Unilateral W/Chest Left  Final Result      Scheduled Meds:  dextromethorphan-guaiFENesin  1 tablet Oral BID   fexofenadine  180 mg Oral Daily   metoprolol tartrate  50 mg Oral BID   pantoprazole  40 mg Oral BID   rosuvastatin  20 mg Oral Daily   sucralfate  1 g Oral TID WC & HS   PRN Meds: acetaminophen **OR** acetaminophen, ALPRAZolam, alum & mag hydroxide-simeth, diclofenac Sodium, hydrALAZINE, lip balm, prochlorperazine, promethazine (PHENERGAN) injection (IM or IVPB) Continuous Infusions:  sodium chloride 100 mL/hr at 04/07/21 1020   promethazine (PHENERGAN) injection (IM or IVPB)       LOS: 4  days  Time spent: Greater than 50% of the 35 minute visit was spent in counseling/coordination of care for the patient as laid out in the A&P.   Dwyane Dee, MD [facility] 04/07/2021, 11:56 AM

## 2021-04-07 NOTE — ED Provider Notes (Signed)
10:02 AM Responded to [place] paged overhead. Upon arrival to the room patient is awake and responsive to questions. Concern for bradycardia and reduced responsiveness per nursing following bowel movement. Medical attending at bedside. Airway intact. Her condition is improving. No immediate ACLS concerns at this time. Will defer further management to primary team. D/w primary physician at bedside.    Wynona Dove A, DO 04/07/21 1004

## 2021-04-07 NOTE — Progress Notes (Signed)
Called by Dr. Sabino Gasser about patient syncope during bowel movement, associated with mild sinus bradycardia on telemetry. She has recovered.  She has HOCM. Her echo does not show an LV outflow gradient at rest (Valsalva maneuver was not performed), but there is marked asymmetrical septal hypertrophy with clear systolic anterior motion of the mitral valve and a very narrow LVOT with color aliasing at [facility] septal contact. MRI confirms HCM pattern.  While a vasovagal mechanism may have been contributory, it is likely that she had increased LV obstruction with straining.  Agree with IV fluids.  We need to avoid vasodilators and diuretics in treatment of her HTN. Replace carvedilol with metoprolol. Stop amlodipine, can use verapamil instead if BP too high.  Will follow up in AM.

## 2021-04-08 LAB — CBC WITH DIFFERENTIAL/PLATELET
Abs Immature Granulocytes: 0.11 10*3/uL — ABNORMAL HIGH (ref 0.00–0.07)
Basophils Absolute: 0 10*3/uL (ref 0.0–0.1)
Basophils Relative: 0 %
Eosinophils Absolute: 0.1 10*3/uL (ref 0.0–0.5)
Eosinophils Relative: 1 %
HCT: 33.8 % — ABNORMAL LOW (ref 36.0–46.0)
Hemoglobin: 11 g/dL — ABNORMAL LOW (ref 12.0–15.0)
Immature Granulocytes: 1 %
Lymphocytes Relative: 23 %
Lymphs Abs: 2.1 10*3/uL (ref 0.7–4.0)
MCH: 29.3 pg (ref 26.0–34.0)
MCHC: 32.5 g/dL (ref 30.0–36.0)
MCV: 89.9 fL (ref 80.0–100.0)
Monocytes Absolute: 0.9 10*3/uL (ref 0.1–1.0)
Monocytes Relative: 10 %
Neutro Abs: 6 10*3/uL (ref 1.7–7.7)
Neutrophils Relative %: 65 %
Platelets: 406 10*3/uL — ABNORMAL HIGH (ref 150–400)
RBC: 3.76 MIL/uL — ABNORMAL LOW (ref 3.87–5.11)
RDW: 15.1 % (ref 11.5–15.5)
WBC: 9.2 10*3/uL (ref 4.0–10.5)
nRBC: 0 % (ref 0.0–0.2)

## 2021-04-08 LAB — COMPREHENSIVE METABOLIC PANEL
ALT: 15 U/L (ref 0–44)
AST: 18 U/L (ref 15–41)
Albumin: 3.1 g/dL — ABNORMAL LOW (ref 3.5–5.0)
Alkaline Phosphatase: 46 U/L (ref 38–126)
Anion gap: 8 (ref 5–15)
BUN: 20 mg/dL (ref 8–23)
CO2: 18 mmol/L — ABNORMAL LOW (ref 22–32)
Calcium: 8.9 mg/dL (ref 8.9–10.3)
Chloride: 114 mmol/L — ABNORMAL HIGH (ref 98–111)
Creatinine, Ser: 1.09 mg/dL — ABNORMAL HIGH (ref 0.44–1.00)
GFR, Estimated: 50 mL/min — ABNORMAL LOW (ref >60)
Glucose, Bld: 126 mg/dL — ABNORMAL HIGH (ref 70–99)
Potassium: 3.7 mmol/L (ref 3.5–5.1)
Sodium: 140 mmol/L (ref 135–145)
Total Bilirubin: 0.4 mg/dL (ref 0.3–1.2)
Total Protein: 6 g/dL — ABNORMAL LOW (ref 6.5–8.1)

## 2021-04-08 LAB — MAGNESIUM: Magnesium: 1.9 mg/dL (ref 1.7–2.4)

## 2021-04-08 NOTE — Care Management Important Message (Signed)
Important Message  Patient Details IM Letter given to the Patient. Name: Anna Hoffman MRN: [id number] Date of Birth: September 19, 1934   Medicare Important Message Given:  Yes     Kerin Salen 04/08/2021, 1:52 PM

## 2021-04-08 NOTE — Progress Notes (Signed)
Progress Note  Patient Name: Anna Hoffman [facility] Date of Encounter: 04/08/2021  Primary Cardiologist: New to [facility]  Subjective   Had episodes ~ 9 AM 04/07/21:  Straining with BM and had syncope and 2:1 HB (suspect all vagally medicated).  Her HCM therapy has been uptitrated.  Patient notes that she her initial event was syncope with walking.  She notes she has had near syncope after large meals, with standing up, and on hot days.  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  Inpatient Medications    Scheduled Meds:  dextromethorphan-guaiFENesin  1 tablet Oral BID   fexofenadine  180 mg Oral Daily   metoprolol tartrate  50 mg Oral BID   pantoprazole  40 mg Oral BID   rosuvastatin  20 mg Oral Daily   sucralfate  1 g Oral TID WC & HS   Continuous Infusions:  promethazine (PHENERGAN) injection (IM or IVPB)     PRN Meds: acetaminophen **OR** acetaminophen, ALPRAZolam, alum & mag hydroxide-simeth, diclofenac Sodium, hydrALAZINE, lip balm, prochlorperazine, promethazine (PHENERGAN) injection (IM or IVPB)   Vital Signs    Vitals:   04/07/21 1135 04/07/21 1400 04/07/21 2006 04/08/21 0536  BP: 131/77 133/71 126/71 (!) 148/81  Pulse: 70 90 84 82  Resp: 14 18 18 16   Temp: 97.9 F (36.6 C) 97.9 F (36.6 C) 98.1 F (36.7 C) 98.2 F (36.8 C)  TempSrc: Oral Oral Oral Oral  SpO2: 94% 91% 95% 98%  Weight:      Height:        Intake/Output Summary (Last 24 hours) at 04/08/2021 0902 Last data filed at 04/08/2021 0500 Gross per 24 hour  Intake 1005.45 ml  Output 1050 ml  Net -44.55 ml   Filed Weights   03/31/21 2102  Weight: 60.8 kg    Telemetry    SR with 04/07/21 2:1 HB - Personally Reviewed  ECG    SVT 123 LVH from 04/03/21 - Personally Reviewed  Physical Exam   GEN: No acute distress.   Neck: No JVD Cardiac: RRR, no murmurs, rubs, or gallops. No systolic murmur with hand grip; patient had difficulty following command for Valsalva Respiratory: Clear  to auscultation bilaterally. GI: Soft, nontender, non-distended  MS: No edema; No deformity. Neuro:  Nonfocal  Psych: Normal affect   Labs    Chemistry Recent Labs  Lab 04/06/21 0343 04/07/21 0958 04/08/21 0307  NA 135 135 140  K 3.5 3.5 3.7  CL 108 107 114*  CO2 23 18* 18*  GLUCOSE 104* 156* 126*  BUN 18 20 20   CREATININE 0.79 1.04* 1.09*  CALCIUM 8.5* 9.1 8.9  PROT  --  6.7 6.0*  ALBUMIN  --  3.5 3.1*  AST  --  18 18  ALT  --  17 15  ALKPHOS  --  52 46  BILITOT  --  0.7 0.4  GFRNONAA >60 53* 50*  ANIONGAP 4* 10 8     Hematology Recent Labs  Lab 04/06/21 0343 04/07/21 0958 04/08/21 0307  WBC 7.6 10.7* 9.2  RBC 4.08 4.40 3.76*  HGB 11.9* 12.8 11.0*  HCT 36.2 39.0 33.8*  MCV 88.7 88.6 89.9  MCH 29.2 29.1 29.3  MCHC 32.9 32.8 32.5  RDW 14.8 15.0 15.1  PLT 412* 421* 406*    Cardiac EnzymesNo results for input(s): TROPONINI in the last 168 hours. No results for input(s): TROPIPOC in the last 168 hours.   BNPNo results for input(s): BNP, PROBNP  in the last 168 hours.   DDimer No results for input(s): DDIMER in the last 168 hours.   Radiology    No results found.  Cardiac Studies    Transthoracic Echocardiogram: Date: 04/03/21 Results: SAM related MR signal on PLAX  1. Left ventricular ejection fraction, by estimation, is 65 to 70%. The  left ventricle has hyperdynamic function. The left ventricle has no  regional wall motion abnormalities. There is severe focal basal septal  left ventricular hypertrophy, no LV  outflow tract gradient. Left ventricular diastolic parameters are  consistent with Grade I diastolic dysfunction (impaired relaxation). The  average left ventricular global longitudinal strain is -20.7 %. The global  longitudinal strain is normal.   2. Right ventricular systolic function is normal. The right ventricular  size is normal. There is normal pulmonary artery systolic pressure. The  estimated right ventricular systolic pressure is  34.1 mmHg.   3. Left atrial size was mildly dilated.   4. The mitral valve is normal in structure. Trivial mitral valve  regurgitation. No evidence of mitral stenosis.   5. The aortic valve is tricuspid. Aortic valve regurgitation is mild.  Mild aortic valve sclerosis is present, with no evidence of aortic valve  stenosis.   6. Aortic dilatation noted. There is mild dilatation of the ascending  aorta, measuring 41 mm.   7. The inferior vena cava is normal in size with greater than 50%  respiratory variability, suggesting right atrial pressure of 3 mmHg.    CMR Date: 04/01/21 Results: Normal Thoracic Aorta for age/BSA IMPRESSION: 1. Asymmetric LV hypertrophy measuring up to 10mm in basal septum (97mm in posterior wall), consistent with hypertrophic cardiomyopathy   2. Patchy late gadolinium enhancement primarily at RV insertion site, consistent with HCM. LGE accounts for 2% of total myocardial mass   3.  Small LV size with normal systolic function (EF 96%)   4.  Small RV size with normal systolic function (EF 22%)    Patient Profile     86 y.o. female syncope in the setting of HOCM  Assessment & Plan    Hypertrophic Cardiomyopathy; suspect labile gradient - with PT difficulty in doing  - with small late peaking MR signal on 04/01/21; sigmoid variant - Family history negative; has two sons who may or may not have been evaluated (unclear in discussion with patient) - ECG notable for LVH, wall thickness of 1.9 cm - Exercise testing for inducible gradient may be considered as outpatient - CMR from  04/03/21 notable for < 5% LGE - absence of atrial fibrillation but has had SVT; artifact presently (~ 913 patient is eating on side of the bed) - heart monitor is pending; no VT on telemetry - metoprolol 50 mg PO BID and if at max tolerated can try verapamil - has ZioPatch planned for outpatient - encourage hydration - likely will not pursue outpatient myosin modulation unless  refractory sx    For questions or updates, please contact [facility] Please consult [email or web address] for contact info under Cardiology/STEMI.      Signed, Werner Lean, MD  04/08/2021, 9:02 AM

## 2021-04-08 NOTE — Progress Notes (Signed)
Progress Note    Anna Hoffman   CZY:[id number]  DOB: 25-Apr-1935  DOA: 03/31/2021     5  PCP: Glendale Chard, MD  Initial CC: fall/syncope  Hospital Course: Anna Hoffman is an 86 yo female with PMH DMII, HTN, CVA, arthritis who presented after a syncopal fall at home.  She does not remember the episode and it was reported that the event lasted less than 1 minute.  She was found by her son on the floor after they heard her fall.  She was admitted for further workup.   Interval History:  No acute events overnight.  Mentation was much more normal today.  She is alert, oriented, and carry on conversation in her normal state (mentation was delayed yesterday after her episode but has improved back to baseline). She remains hemodynamically stable and has been seen by cardiology today as well.  Medications remain the same.  Plan is still for outpatient Zio patch to be placed. If no further events, should be suitable for discharge on Tuesday or Wednesday.  ROS: Constitutional: negative for chills and fevers, Respiratory: negative for cough, Cardiovascular: negative for chest pain, and Gastrointestinal: negative for abdominal pain  Assessment & Plan: * Syncope and collapse-resolved as of 04/04/2021 - patient denied prior history of similar but description of event is consistent with LOC - etiology presumed cardiac with further workup required. She has a history of AV block and also reports some CP when she's exerting herself at times. Echo shows severe focal basal septal LVH; cardiac MRI consistent with HCM - orthostatics negative and repeated; remained negative - cardiology consulted for further assistance given prior findings - troponin negative on 8/31  HOCM (hypertrophic obstructive cardiomyopathy) ([place]) - confirmed on cardiac MRI - possible contribution to syncope if she was also dehydrated - vasovagal/syncopal/bradycardic event on 9/4 in the morning; cardiology notified and meds adjusted;  event is considered due to her narrow outflow tract in conjunction with presumed straining on the commode - coreg transitioned to metoprolol and PRN verapamil (if needed after metoprolol maxed) - d/c'd amlodipine per cardiology - Zio patch to be mailed  - s/p IVF; will continue encouraging hydration and use IVF if/as needed  Physical deconditioning - patient appears to be deconditioned at this point; trouble sitting upright in bed - PT eval; recommended SNF, patient agreeable    Benign essential HTN - Negative orthostatics - continue BP meds as above   Cough-resolved as of 04/07/2021 - differential includes GERD vs allergies vs URI that is developing - CXR obtained on 9/2 is clear - continue allergy meds; added mucinex - check RVP swab = negative -Symptoms improved on 04/06/2021  Nausea and vomiting-resolved as of 04/06/2021 - began overnight of 8/30 (was given 2 doses decadron IV as well); past GI notes reviewed (hx Grade C esophagitis s/p EGD 10/2020) - she responded well to BID protonix and carafate in the past; these have been resumed  -Symptoms have also improved since starting treatment  Ascending aorta dilatation ([place]) - noted on echo on 04/01/21 - measures 41 mm - repeat echo in 1 year per cardiology   Thyroid nodule - Incidental right thyroid nodule noted on CT.  Measures 20 mm. -Nonemergent ultrasound recommended.  May be done outpatient  Fall at home, initial encounter - presumed from a syncopal event as she has no prodrome symptoms and no recollection - CTH, CT-C spine, and CT maxillofacial are negative for acute abnormalities or fracture - Rib xray non-diagnostic, possible left sided  rib fx; treatment still supportive care   Chronic kidney disease, stage 3a ([place]) - patient has history of CKD3a. Baseline creat ~ 1, eGFR 50  Mixed hyperlipidemia - Continue statin  Type 2 diabetes mellitus with stage 3 chronic kidney disease, without long-term current use of insulin  ([name]) - last A1c 6.2% on 02/20/21  Leukocytosis-resolved as of 04/02/2021 - Considered reactive on admission - Has normalized   Old records reviewed in assessment of this patient  Antimicrobials:   DVT prophylaxis: SCDs Start: 03/31/21 1752   Code Status:   Code Status: Full Code Family Communication: sons  Disposition Plan: Status is: Inpatient  Remains inpatient appropriate because:IV treatments appropriate due to intensity of illness or inability to take PO and Inpatient level of care appropriate due to severity of illness  Dispo: The patient is from: Home              Anticipated d/c is to: SNF              Patient currently is not medically stable to d/c.  Possibly Tuesday or Wednesday discharge   Difficult to place patient No  Risk of unplanned readmission score: Unplanned Admission- Pilot do not use: 23.43   Objective: Blood pressure (!) 148/81, pulse 82, temperature 98.2 F (36.8 C), temperature source Oral, resp. rate 16, height 5' 5.5" (1.664 m), weight 60.8 kg, SpO2 98 %.  Examination: General appearance: alert, cooperative, fatigued, and no distress Head: Normocephalic, without obvious abnormality, atraumatic Eyes:  EOMI Lungs: clear to auscultation bilaterally Heart: regular rate and rhythm and S1, S2 normal Abdomen: normal findings: bowel sounds normal and soft, non-tender Extremities:  no edema Skin: mobility and turgor normal Neurologic: Grossly normal  Consultants:  Cardiology  Procedures:    Data Reviewed: I have personally reviewed following labs and imaging studies Results for orders placed or performed during the hospital encounter of 03/31/21 (from the past 24 hour(s))  Lactic acid, plasma     Status: None   Collection Time: 04/07/21  2:04 PM  Result Value Ref Range   Lactic Acid, Venous 1.3 0.5 - 1.9 mmol/L  CBC with Differential/Platelet     Status: Abnormal   Collection Time: 04/08/21  3:07 AM  Result Value Ref Range   WBC 9.2 4.0 -  10.5 K/uL   RBC 3.76 (L) 3.87 - 5.11 MIL/uL   Hemoglobin 11.0 (L) 12.0 - 15.0 g/dL   HCT 33.8 (L) 36.0 - 46.0 %   MCV 89.9 80.0 - 100.0 fL   MCH 29.3 26.0 - 34.0 pg   MCHC 32.5 30.0 - 36.0 g/dL   RDW 15.1 11.5 - 15.5 %   Platelets 406 (H) 150 - 400 K/uL   nRBC 0.0 0.0 - 0.2 %   Neutrophils Relative % 65 %   Neutro Abs 6.0 1.7 - 7.7 K/uL   Lymphocytes Relative 23 %   Lymphs Abs 2.1 0.7 - 4.0 K/uL   Monocytes Relative 10 %   Monocytes Absolute 0.9 0.1 - 1.0 K/uL   Eosinophils Relative 1 %   Eosinophils Absolute 0.1 0.0 - 0.5 K/uL   Basophils Relative 0 %   Basophils Absolute 0.0 0.0 - 0.1 K/uL   Immature Granulocytes 1 %   Abs Immature Granulocytes 0.11 (H) 0.00 - 0.07 K/uL  Comprehensive metabolic panel     Status: Abnormal   Collection Time: 04/08/21  3:07 AM  Result Value Ref Range   Sodium 140 135 - 145 mmol/L   Potassium 3.7  3.5 - 5.1 mmol/L   Chloride 114 (H) 98 - 111 mmol/L   CO2 18 (L) 22 - 32 mmol/L   Glucose, Bld 126 (H) 70 - 99 mg/dL   BUN 20 8 - 23 mg/dL   Creatinine, Ser 1.09 (H) 0.44 - 1.00 mg/dL   Calcium 8.9 8.9 - 10.3 mg/dL   Total Protein 6.0 (L) 6.5 - 8.1 g/dL   Albumin 3.1 (L) 3.5 - 5.0 g/dL   AST 18 15 - 41 U/L   ALT 15 0 - 44 U/L   Alkaline Phosphatase 46 38 - 126 U/L   Total Bilirubin 0.4 0.3 - 1.2 mg/dL   GFR, Estimated 50 (L) >60 mL/min   Anion gap 8 5 - 15  Magnesium     Status: None   Collection Time: 04/08/21  3:07 AM  Result Value Ref Range   Magnesium 1.9 1.7 - 2.4 mg/dL    Recent Results (from the past 240 hour(s))  Resp Panel by RT-PCR (Flu A&B, Covid) Nasopharyngeal Swab     Status: None   Collection Time: 03/31/21  7:59 PM   Specimen: Nasopharyngeal Swab; Nasopharyngeal(NP) swabs in vial transport medium  Result Value Ref Range Status   SARS Coronavirus 2 by RT PCR NEGATIVE NEGATIVE Final    Comment: (NOTE) SARS-CoV-2 target nucleic acids are NOT DETECTED.  The SARS-CoV-2 RNA is generally detectable in upper respiratory specimens  during the acute phase of infection. The lowest concentration of SARS-CoV-2 viral copies this assay can detect is 138 copies/mL. A negative result does not preclude SARS-Cov-2 infection and should not be used as the sole basis for treatment or other patient management decisions. A negative result may occur with  improper specimen collection/handling, submission of specimen other than nasopharyngeal swab, presence of viral mutation(s) within the areas targeted by this assay, and inadequate number of viral copies(<138 copies/mL). A negative result must be combined with clinical observations, patient history, and epidemiological information. The expected result is Negative.  Fact Sheet for Patients:  EntrepreneurPulse.com.au  Fact Sheet for Healthcare Providers:  IncredibleEmployment.be  This test is no t yet approved or cleared by the [place] FDA and  has been authorized for detection and/or diagnosis of SARS-CoV-2 by FDA under an Emergency Use Authorization (EUA). This EUA will remain  in effect (meaning this test can be used) for the duration of the COVID-19 declaration under Section 564(b)(1) of the Act, 21 U.S.C.section 360bbb-3(b)(1), unless the authorization is terminated  or revoked sooner.       Influenza A by PCR NEGATIVE NEGATIVE Final   Influenza B by PCR NEGATIVE NEGATIVE Final    Comment: (NOTE) The Xpert Xpress SARS-CoV-2/FLU/RSV plus assay is intended as an aid in the diagnosis of influenza from Nasopharyngeal swab specimens and should not be used as a sole basis for treatment. Nasal washings and aspirates are unacceptable for Xpert Xpress SARS-CoV-2/FLU/RSV testing.  Fact Sheet for Patients: EntrepreneurPulse.com.au  Fact Sheet for Healthcare Providers: IncredibleEmployment.be  This test is not yet approved or cleared by the [place] FDA and has been authorized for detection  and/or diagnosis of SARS-CoV-2 by FDA under an Emergency Use Authorization (EUA). This EUA will remain in effect (meaning this test can be used) for the duration of the COVID-19 declaration under Section 564(b)(1) of the Act, 21 U.S.C. section 360bbb-3(b)(1), unless the authorization is terminated or revoked.  Performed at [facility], [place] [address] [place], [place] [address]   Respiratory (~20 pathogens) panel by PCR  Status: None   Collection Time: 04/05/21  2:55 PM   Specimen: Nasopharyngeal Swab; Respiratory  Result Value Ref Range Status   Adenovirus NOT DETECTED NOT DETECTED Final   Coronavirus 229E NOT DETECTED NOT DETECTED Final    Comment: (NOTE) The Coronavirus on the Respiratory Panel, DOES NOT test for the novel  Coronavirus (2019 nCoV)    Coronavirus HKU1 NOT DETECTED NOT DETECTED Final   Coronavirus NL63 NOT DETECTED NOT DETECTED Final   Coronavirus OC43 NOT DETECTED NOT DETECTED Final   Metapneumovirus NOT DETECTED NOT DETECTED Final   Rhinovirus / Enterovirus NOT DETECTED NOT DETECTED Final   Influenza A NOT DETECTED NOT DETECTED Final   Influenza B NOT DETECTED NOT DETECTED Final   Parainfluenza Virus 1 NOT DETECTED NOT DETECTED Final   Parainfluenza Virus 2 NOT DETECTED NOT DETECTED Final   Parainfluenza Virus 3 NOT DETECTED NOT DETECTED Final   Parainfluenza Virus 4 NOT DETECTED NOT DETECTED Final   Respiratory Syncytial Virus NOT DETECTED NOT DETECTED Final   Bordetella pertussis NOT DETECTED NOT DETECTED Final   Bordetella Parapertussis NOT DETECTED NOT DETECTED Final   Chlamydophila pneumoniae NOT DETECTED NOT DETECTED Final   Mycoplasma pneumoniae NOT DETECTED NOT DETECTED Final    Comment: Performed at [facility], [place]. [address], [place], [place] [address]     Radiology Studies: No results found. DG CHEST PORT 1 VIEW  Final Result    MR CARDIAC MORPHOLOGY W WO CONTRAST  Final Result    VAS US CAROTID  Final  Result    CT Head Wo Contrast  Final Result    CT Cervical Spine Wo Contrast  Final Result    CT Maxillofacial WO CM  Final Result    DG Ribs Unilateral W/Chest Left  Final Result      Scheduled Meds:  dextromethorphan-guaiFENesin  1 tablet Oral BID   fexofenadine  180 mg Oral Daily   metoprolol tartrate  50 mg Oral BID   pantoprazole  40 mg Oral BID   rosuvastatin  20 mg Oral Daily   sucralfate  1 g Oral TID WC & HS   PRN Meds: acetaminophen **OR** acetaminophen, ALPRAZolam, alum & mag hydroxide-simeth, diclofenac Sodium, hydrALAZINE, lip balm, prochlorperazine, promethazine (PHENERGAN) injection (IM or IVPB) Continuous Infusions:  promethazine (PHENERGAN) injection (IM or IVPB)       LOS: 5 days  Time spent: Greater than 50% of the 35 minute visit was spent in counseling/coordination of care for the patient as laid out in the A&P.   Dwyane Dee, MD [facility] 04/08/2021, 1:13 PM

## 2021-04-08 NOTE — Progress Notes (Signed)
Physical Therapy Treatment Patient Details Name: Anna Hoffman MRN: [id number] DOB: 1935/08/04 Today's Date: 04/08/2021    History of Present Illness Anna Hoffman is a 86 y.o. female with medical history significant of T2DM, HTN, CVA, who presented with syncope and fall at home. Experienced another  syncope spell 04/07/21 and code called.    PT Comments    Patient reports feeling better but did not sleep.  Patient ambulated x 60' with Rw. BP 149/94 HR 71 .   No S/S of syncopy  Follow Up Recommendations  SNF     Equipment Recommendations  Rolling walker with 5" wheels    Recommendations for Other Services       Precautions / Restrictions Precautions Precautions: Fall Precaution Comments: monitor vitals, has had syncopal spells    Mobility  Bed Mobility               General bed mobility comments: in recliner    Transfers Overall transfer level: Needs assistance Equipment used: Rolling walker (2 wheeled) Transfers: Sit to/from Stand Sit to Stand: Min guard         General transfer comment: more effort from low toilet.nopt a problem from  recliner.  Ambulation/Gait Ambulation/Gait assistance: Min guard Gait Distance (Feet): 60 Feet Assistive device: Rolling walker (2 wheeled) Gait Pattern/deviations: Step-through pattern;Decreased stride length;Drifts right/left     General Gait Details: patient is much stronger today.   Stairs             Wheelchair Mobility    Modified Rankin (Stroke Patients Only)       Balance Overall balance assessment: Needs assistance Sitting-balance support: Feet supported;No upper extremity supported Sitting balance-Leahy Scale: Good     Standing balance support: During functional activity;Single extremity supported Standing balance-Leahy Scale: Fair Standing balance comment: washing hands at sink                            Cognition Arousal/Alertness: Awake/alert Behavior During Therapy: WFL  for tasks assessed/performed                                          Exercises      General Comments        Pertinent Vitals/Pain Faces Pain Scale: Hurts little more Pain Location: left ankle Pain Descriptors / Indicators: Aching Pain Intervention(s): Monitored during session    Home Living                      Prior Function            PT Goals (current goals can now be found in the care plan section) Progress towards PT goals: Progressing toward goals    Frequency    Min 2X/week      PT Plan Current plan remains appropriate;Frequency needs to be updated    Co-evaluation              AM-PAC PT "6 Clicks" Mobility   Outcome Measure  Help needed turning from your back to your side while in a flat bed without using bedrails?: A Little Help needed moving from lying on your back to sitting on the side of a flat bed without using bedrails?: A Little Help needed moving to and from a bed to a chair (including a wheelchair)?: A Little Help needed standing  up from a chair using your arms (e.g., wheelchair or bedside chair)?: A Little Help needed to walk in hospital room?: A Little Help needed climbing 3-5 steps with a railing? : A Lot 6 Click Score: 17    End of Session Equipment Utilized During Treatment: Gait belt Activity Tolerance: Patient tolerated treatment well Patient left: in chair;with chair alarm set;with call bell/phone within reach Nurse Communication: Mobility status PT Visit Diagnosis: Other abnormalities of gait and mobility (R26.89);History of falling (Z91.81);Muscle weakness (generalized) (M62.81)     Time: 5397-6734 PT Time Calculation (min) (ACUTE ONLY): 25 min  Charges:  $Gait Training: 23-37 mins                     [place] Pager [phone] Office [phone]    [name] 04/08/2021, 1:21 PM

## 2021-04-09 ENCOUNTER — Ambulatory Visit (INDEPENDENT_AMBULATORY_CARE_PROVIDER_SITE_OTHER): Payer: Medicare Other

## 2021-04-09 ENCOUNTER — Inpatient Hospital Stay: Payer: Medicare Other | Admitting: Internal Medicine

## 2021-04-09 DIAGNOSIS — Z7401 Bed confinement status: Secondary | ICD-10-CM | POA: Diagnosis not present

## 2021-04-09 DIAGNOSIS — I10 Essential (primary) hypertension: Secondary | ICD-10-CM | POA: Diagnosis not present

## 2021-04-09 DIAGNOSIS — R2689 Other abnormalities of gait and mobility: Secondary | ICD-10-CM | POA: Diagnosis not present

## 2021-04-09 DIAGNOSIS — R55 Syncope and collapse: Secondary | ICD-10-CM | POA: Diagnosis not present

## 2021-04-09 DIAGNOSIS — W19XXXA Unspecified fall, initial encounter: Secondary | ICD-10-CM | POA: Diagnosis not present

## 2021-04-09 DIAGNOSIS — I77810 Thoracic aortic ectasia: Secondary | ICD-10-CM | POA: Diagnosis not present

## 2021-04-09 DIAGNOSIS — E782 Mixed hyperlipidemia: Secondary | ICD-10-CM | POA: Diagnosis not present

## 2021-04-09 DIAGNOSIS — I440 Atrioventricular block, first degree: Secondary | ICD-10-CM | POA: Diagnosis not present

## 2021-04-09 DIAGNOSIS — N1831 Chronic kidney disease, stage 3a: Secondary | ICD-10-CM | POA: Diagnosis not present

## 2021-04-09 DIAGNOSIS — Z8673 Personal history of transient ischemic attack (TIA), and cerebral infarction without residual deficits: Secondary | ICD-10-CM | POA: Diagnosis not present

## 2021-04-09 DIAGNOSIS — E1122 Type 2 diabetes mellitus with diabetic chronic kidney disease: Secondary | ICD-10-CM | POA: Diagnosis not present

## 2021-04-09 DIAGNOSIS — R112 Nausea with vomiting, unspecified: Secondary | ICD-10-CM | POA: Diagnosis not present

## 2021-04-09 DIAGNOSIS — I129 Hypertensive chronic kidney disease with stage 1 through stage 4 chronic kidney disease, or unspecified chronic kidney disease: Secondary | ICD-10-CM

## 2021-04-09 DIAGNOSIS — W19XXXD Unspecified fall, subsequent encounter: Secondary | ICD-10-CM | POA: Diagnosis not present

## 2021-04-09 DIAGNOSIS — I441 Atrioventricular block, second degree: Secondary | ICD-10-CM | POA: Diagnosis not present

## 2021-04-09 DIAGNOSIS — E46 Unspecified protein-calorie malnutrition: Secondary | ICD-10-CM | POA: Diagnosis not present

## 2021-04-09 DIAGNOSIS — E041 Nontoxic single thyroid nodule: Secondary | ICD-10-CM | POA: Diagnosis not present

## 2021-04-09 DIAGNOSIS — I471 Supraventricular tachycardia: Secondary | ICD-10-CM

## 2021-04-09 DIAGNOSIS — M19012 Primary osteoarthritis, left shoulder: Secondary | ICD-10-CM | POA: Diagnosis not present

## 2021-04-09 DIAGNOSIS — Y92009 Unspecified place in unspecified non-institutional (private) residence as the place of occurrence of the external cause: Secondary | ICD-10-CM | POA: Diagnosis not present

## 2021-04-09 DIAGNOSIS — M6281 Muscle weakness (generalized): Secondary | ICD-10-CM | POA: Diagnosis not present

## 2021-04-09 DIAGNOSIS — I421 Obstructive hypertrophic cardiomyopathy: Secondary | ICD-10-CM | POA: Diagnosis not present

## 2021-04-09 DIAGNOSIS — K2100 Gastro-esophageal reflux disease with esophagitis, without bleeding: Secondary | ICD-10-CM | POA: Diagnosis not present

## 2021-04-09 DIAGNOSIS — R5381 Other malaise: Secondary | ICD-10-CM | POA: Diagnosis not present

## 2021-04-09 LAB — CBC WITH DIFFERENTIAL/PLATELET
Abs Immature Granulocytes: 0.08 10*3/uL — ABNORMAL HIGH (ref 0.00–0.07)
Basophils Absolute: 0.1 10*3/uL (ref 0.0–0.1)
Basophils Relative: 1 %
Eosinophils Absolute: 0.1 10*3/uL (ref 0.0–0.5)
Eosinophils Relative: 1 %
HCT: 34.7 % — ABNORMAL LOW (ref 36.0–46.0)
Hemoglobin: 11.3 g/dL — ABNORMAL LOW (ref 12.0–15.0)
Immature Granulocytes: 1 %
Lymphocytes Relative: 26 %
Lymphs Abs: 2.5 10*3/uL (ref 0.7–4.0)
MCH: 29.2 pg (ref 26.0–34.0)
MCHC: 32.6 g/dL (ref 30.0–36.0)
MCV: 89.7 fL (ref 80.0–100.0)
Monocytes Absolute: 0.8 10*3/uL (ref 0.1–1.0)
Monocytes Relative: 9 %
Neutro Abs: 5.9 10*3/uL (ref 1.7–7.7)
Neutrophils Relative %: 62 %
Platelets: 429 10*3/uL — ABNORMAL HIGH (ref 150–400)
RBC: 3.87 MIL/uL (ref 3.87–5.11)
RDW: 15.2 % (ref 11.5–15.5)
WBC: 9.5 10*3/uL (ref 4.0–10.5)
nRBC: 0 % (ref 0.0–0.2)

## 2021-04-09 LAB — COMPREHENSIVE METABOLIC PANEL
ALT: 16 U/L (ref 0–44)
AST: 16 U/L (ref 15–41)
Albumin: 3.3 g/dL — ABNORMAL LOW (ref 3.5–5.0)
Alkaline Phosphatase: 46 U/L (ref 38–126)
Anion gap: 8 (ref 5–15)
BUN: 21 mg/dL (ref 8–23)
CO2: 18 mmol/L — ABNORMAL LOW (ref 22–32)
Calcium: 8.8 mg/dL — ABNORMAL LOW (ref 8.9–10.3)
Chloride: 110 mmol/L (ref 98–111)
Creatinine, Ser: 0.98 mg/dL (ref 0.44–1.00)
GFR, Estimated: 57 mL/min — ABNORMAL LOW (ref >60)
Glucose, Bld: 118 mg/dL — ABNORMAL HIGH (ref 70–99)
Potassium: 3.6 mmol/L (ref 3.5–5.1)
Sodium: 136 mmol/L (ref 135–145)
Total Bilirubin: 0.6 mg/dL (ref 0.3–1.2)
Total Protein: 6.1 g/dL — ABNORMAL LOW (ref 6.5–8.1)

## 2021-04-09 LAB — MAGNESIUM: Magnesium: 1.9 mg/dL (ref 1.7–2.4)

## 2021-04-09 LAB — RESP PANEL BY RT-PCR (FLU A&B, COVID) ARPGX2
Influenza A by PCR: NEGATIVE
Influenza B by PCR: NEGATIVE
SARS Coronavirus 2 by RT PCR: NEGATIVE

## 2021-04-09 MED ORDER — METOPROLOL TARTRATE 50 MG PO TABS: 50.0000 mg | ORAL_TABLET | Freq: Two times a day (BID) | ORAL | Status: DC

## 2021-04-09 NOTE — Progress Notes (Signed)
Progress Note  Patient Name: Anna Hoffman Date of Encounter: 04/09/2021  Primary Cardiologist: New to [facility]  Subjective   No events overnight.  Patient notes that she feels much better.  Is eager to go home.  She has concerns about future syncope.  Inpatient Medications    Scheduled Meds:  dextromethorphan-guaiFENesin  1 tablet Oral BID   fexofenadine  180 mg Oral Daily   metoprolol tartrate  50 mg Oral BID   pantoprazole  40 mg Oral BID   rosuvastatin  20 mg Oral Daily   sucralfate  1 g Oral TID WC & HS   Continuous Infusions:  promethazine (PHENERGAN) injection (IM or IVPB)     PRN Meds: acetaminophen **OR** acetaminophen, ALPRAZolam, alum & mag hydroxide-simeth, diclofenac Sodium, hydrALAZINE, lip balm, prochlorperazine, promethazine (PHENERGAN) injection (IM or IVPB)   Vital Signs    Vitals:   04/08/21 1340 04/08/21 1542 04/08/21 2020 04/09/21 0507  BP: (!) 141/79 (!) 153/83 (!) 144/85 135/81  Pulse: 76 76 87 82  Resp: 18 15 20 20   Temp: 98.1 F (36.7 C) 98.3 F (36.8 C) 98.1 F (36.7 C) 98.1 F (36.7 C)  TempSrc:  Oral Oral Oral  SpO2: 94% 97% 95% 96%  Weight:      Height:        Intake/Output Summary (Last 24 hours) at 04/09/2021 1779 Last data filed at 04/08/2021 2000 Gross per 24 hour  Intake 940 ml  Output 300 ml  Net 640 ml   Filed Weights   03/31/21 2102  Weight: 60.8 kg    Telemetry    SR without Mobtiz Type I HB - Personally Reviewed  ECG    No new- Personally Reviewed  Physical Exam   GEN: No acute distress.   Neck: No JVD Cardiac: RRR, no murmurs, rubs, or gallops. Soft systolic murmur with valsalva Respiratory: Clear to auscultation bilaterally. GI: Soft, nontender, non-distended  MS: No edema; No deformity. Neuro:  Nonfocal  Psych: Normal affect   Labs    Chemistry Recent Labs  Lab 04/07/21 0958 04/08/21 0307 04/09/21 0307  NA 135 140 136  K 3.5 3.7 3.6  CL 107 114* 110  CO2 18* 18* 18*  GLUCOSE 156* 126* 118*   BUN 20 20 21   CREATININE 1.04* 1.09* 0.98  CALCIUM 9.1 8.9 8.8*  PROT 6.7 6.0* 6.1*  ALBUMIN 3.5 3.1* 3.3*  AST 18 18 16   ALT 17 15 16   ALKPHOS 52 46 46  BILITOT 0.7 0.4 0.6  GFRNONAA 53* 50* 57*  ANIONGAP 10 8 8      Hematology Recent Labs  Lab 04/07/21 0958 04/08/21 0307 04/09/21 0307  WBC 10.7* 9.2 9.5  RBC 4.40 3.76* 3.87  HGB 12.8 11.0* 11.3*  HCT 39.0 33.8* 34.7*  MCV 88.6 89.9 89.7  MCH 29.1 29.3 29.2  MCHC 32.8 32.5 32.6  RDW 15.0 15.1 15.2  PLT 421* 406* 429*    Cardiac EnzymesNo results for input(s): TROPONINI in the last 168 hours. No results for input(s): TROPIPOC in the last 168 hours.   BNPNo results for input(s): BNP, PROBNP in the last 168 hours.   DDimer No results for input(s): DDIMER in the last 168 hours.   Radiology    No results found.  Cardiac Studies    Transthoracic Echocardiogram: Date: 04/03/21 Results: SAM related MR signal on PLAX  1. Left ventricular ejection fraction, by estimation, is 65 to 70%. The  left ventricle has hyperdynamic function. The left ventricle has no  regional wall motion abnormalities. There is severe focal basal septal  left ventricular hypertrophy, no LV  outflow tract gradient. Left ventricular diastolic parameters are  consistent with Grade I diastolic dysfunction (impaired relaxation). The  average left ventricular global longitudinal strain is -20.7 %. The global  longitudinal strain is normal.   2. Right ventricular systolic function is normal. The right ventricular  size is normal. There is normal pulmonary artery systolic pressure. The  estimated right ventricular systolic pressure is 09.9 mmHg.   3. Left atrial size was mildly dilated.   4. The mitral valve is normal in structure. Trivial mitral valve  regurgitation. No evidence of mitral stenosis.   5. The aortic valve is tricuspid. Aortic valve regurgitation is mild.  Mild aortic valve sclerosis is present, with no evidence of aortic valve   stenosis.   6. Aortic dilatation noted. There is mild dilatation of the ascending  aorta, measuring 41 mm.   7. The inferior vena cava is normal in size with greater than 50%  respiratory variability, suggesting right atrial pressure of 3 mmHg.    CMR Date: 04/01/21 Results: Normal Thoracic Aorta for age/BSA IMPRESSION: 1. Asymmetric LV hypertrophy measuring up to 80mm in basal septum (8mm in posterior wall), consistent with hypertrophic cardiomyopathy   2. Patchy late gadolinium enhancement primarily at RV insertion site, consistent with HCM. LGE accounts for 2% of total myocardial mass   3.  Small LV size with normal systolic function (EF 83%)   4.  Small RV size with normal systolic function (EF 38%)    Patient Profile     86 y.o. female syncope in the setting of HOCM  Assessment & Plan    Hypertrophic Cardiomyopathy; suspect labile gradient P-SVT HTN Mobtiz Type I HB - with small late peaking MR signal on 04/01/21; sigmoid variant - Family history negative  - ECG notable for LVH, wall thickness of 1.9 cm - Exercise testing for inducible gradient may be considered as outpatient (presently would not be able to tolerate) - CMR from  04/03/21 notable for < 5% LGE - During her course Coreg was switched to metoprolol 50 mg PO BID because of hypotension; last BP 120/70s and given Mobtiz Type I HB will not attempt further BB increase at this time - if unable to further add metoprolol verapamil could be considered - has ZioPatch planned for outpatient - encourage hydration - likely will not pursue outpatient myosin modulation unless - refractory sx - if blood pressures continue to climb up we can either transition metoprolol back to Coreg or and spironolactone 12.5 mg PO daily - stool softener could be reasonable; syncope in the setting of straining  Nearing discharge- will work on outpatient follow up  For questions or updates, please contact [place] Please  consult [email or web address] for contact info under Cardiology/STEMI.      Signed, Werner Lean, MD  04/09/2021, 8:12 AM

## 2021-04-09 NOTE — Discharge Summary (Signed)
Physician Discharge Summary   Anna Hoffman [facility] QQI:[id number] DOB: July 16, 1935 DOA: 03/31/2021  PCP: Glendale Chard, MD  Admit date: 03/31/2021 Discharge date: 04/09/2021  Admitted From: home Disposition:  SNF Discharging physician: Dwyane Dee, MD  Recommendations for Outpatient Follow-up:  Stay hydrated (3-4 bottles, 20 oz of water daily) If BP elevates can add spironolactone or transition back to coreg Follow up with cardiology  Repeat echo in 1 year to follow up aortic dilation Obtain outpatient thyroid ultrasound to follow up incidental thyroid nodule   Home Health:  Equipment/Devices:   Patient discharged to SNF in Discharge Condition: stable Risk of unplanned readmission score: Unplanned Admission- Pilot do not use: 25.38  CODE STATUS: Full Diet recommendation:  Diet Orders (From admission, onward)     Start     Ordered   04/09/21 0000  Diet - low sodium heart healthy        04/09/21 1232   04/09/21 0000  Diet Carb Modified        04/09/21 1232   03/31/21 1752  Diet Carb Modified Fluid consistency: Thin; Room service appropriate? Yes  Diet effective now       Question Answer Comment  Diet-HS Snack? Nothing   Calorie Level Medium 1600-2000   Fluid consistency: Thin   Room service appropriate? Yes      03/31/21 1752            Hospital Course: Anna Hoffman is an 86 yo female with PMH DMII, HTN, CVA, arthritis who presented after a syncopal fall at home.  She does not remember the episode and it was reported that the event lasted less than 1 minute.  She was found by her son on the floor after they heard her fall.  She was admitted for further workup.   See below for further A&P.   * Syncope and collapse-resolved as of 04/04/2021 - patient denied prior history of similar but description of event is consistent with LOC - etiology presumed cardiac with further workup required. She has a history of AV block and also reports some CP when she's exerting herself at times.  Echo shows severe focal basal septal LVH; cardiac MRI consistent with HCM - orthostatics negative and repeated; remained negative - cardiology consulted for further assistance given prior findings - troponin negative on 8/31  HOCM (hypertrophic obstructive cardiomyopathy) ([place]) - confirmed on cardiac MRI - possible contribution to syncope if she was also dehydrated - vasovagal/syncopal/bradycardic event on 9/4 in the morning; cardiology notified and meds adjusted; event is considered due to her narrow outflow tract in conjunction with presumed straining on the commode - coreg transitioned to metoprolol  - d/c'd amlodipine per cardiology - Zio patch to be mailed  - s/p IVF; will continue encouraging hydration - if BP does elevate agaion can consider addition of spironolactone (12.5 mg daily) vs transition back to coreg - outpatient follow up with cardiology  Physical deconditioning - patient appears to be deconditioned at this point - PT eval; recommended SNF, patient agreeable    Benign essential HTN - Negative orthostatics - continue BP meds as above   Cough-resolved as of 04/07/2021 - differential includes GERD vs allergies vs URI that is developing - CXR obtained on 9/2 is clear - continue allergy meds; added mucinex - check RVP swab = negative -Symptoms improved on 04/06/2021  Nausea and vomiting-resolved as of 04/06/2021 - began overnight of 8/30 (was given 2 doses decadron IV as well); past GI notes reviewed (hx Grade C esophagitis  s/p EGD 10/2020) - she responded well to BID protonix and carafate in the past; these have been resumed  -Symptoms have also improved since starting treatment  Ascending aorta dilatation ([place]) - noted on echo on 04/01/21 - measures 41 mm - repeat echo in 1 year per cardiology   Thyroid nodule - Incidental right thyroid nodule noted on CT.  Measures 20 mm. -Nonemergent ultrasound recommended.  May be done outpatient  Fall at home, initial  encounter - presumed from a syncopal event as she has no prodrome symptoms and no recollection - CTH, CT-C spine, and CT maxillofacial are negative for acute abnormalities or fracture - Rib xray non-diagnostic, possible left sided rib fx; treatment still supportive care   Chronic kidney disease, stage 3a ([place]) - patient has history of CKD3a. Baseline creat ~ 1, eGFR 50  Mixed hyperlipidemia - Continue statin  Type 2 diabetes mellitus with stage 3 chronic kidney disease, without long-term current use of insulin ([place]) - last A1c 6.2% on 02/20/21  Leukocytosis-resolved as of 04/02/2021 - Considered reactive on admission - Has normalized    Principal Diagnosis: Syncope and collapse  Discharge Diagnoses: Active Hospital Problems   Diagnosis Date Noted   HOCM (hypertrophic obstructive cardiomyopathy) ([place]) 04/04/2021    Priority: High   Physical deconditioning 04/04/2021    Priority: Medium   Benign essential HTN     Priority: Medium   Ascending aorta dilatation ([place]) 04/05/2021    Priority: Low   Fall at home, initial encounter 03/31/2021    Priority: Low   Thyroid nodule 03/31/2021    Priority: Low   Syncope 04/03/2021   Chronic kidney disease, stage 3a ([place]) 07/19/2018   Mixed hyperlipidemia    Type 2 diabetes mellitus with stage 3 chronic kidney disease, without long-term current use of insulin [employer])     Resolved Hospital Problems   Diagnosis Date Noted Date Resolved   Syncope and collapse 03/31/2021 04/04/2021    Priority: High   Cough 04/05/2021 04/07/2021    Priority: Medium   Nausea and vomiting 04/03/2021 04/06/2021    Priority: Medium   Leukocytosis 03/31/2021 04/02/2021    Discharge Instructions     Diet - low sodium heart healthy   Complete by: As directed    Diet Carb Modified   Complete by: As directed    Discharge instructions   Complete by: As directed    Stay hydrated; consume approximately 3 to 4, 20 oz bottles of water daily.   Increase  activity slowly   Complete by: As directed       Allergies as of 04/09/2021       Reactions   Amoxicillin Diarrhea   Has patient had a PCN reaction causing immediate rash, facial/tongue/throat swelling, SOB or lightheadedness with hypotension: no Has patient had a PCN reaction causing severe rash involving mucus membranes or skin necrosis: no Has patient had a PCN reaction that required hospitalization: no pharmacist consult Has patient had a PCN reaction occurring within the last 10 years: yes If all of the above answers are "NO", then may proceed with Cephalosporin use.   Ampicillin Rash   - Tolerates Rocephin and Ancef - Remote occurrence; no symptoms of anaphylaxis or severe cutaneous reaction, and no additional medical attention required   Sulfa Antibiotics Rash        Medication List     STOP taking these medications    amLODipine 10 MG tablet Commonly known as: NORVASC   sucralfate 1 GM/10ML suspension Commonly known  as: CARAFATE       TAKE these medications    acetaminophen 325 MG tablet Commonly known as: TYLENOL Take 2 tablets (650 mg total) by mouth every 6 (six) hours as needed for mild pain (or Fever >/= 101).   ALLEGRA PO Take 180 mg by mouth at bedtime.   azelastine 0.1 % nasal spray Commonly known as: ASTELIN Place 2 sprays into both nostrils daily as needed for rhinitis.   glucose monitoring kit monitoring kit Use as directed to check blood sugars 1 time per day dx: e11.22   LUBRICATING EYE DROPS OP Place 1 drop into both eyes daily as needed (dry eyes).   metoprolol tartrate 50 MG tablet Commonly known as: LOPRESSOR Take 1 tablet (50 mg total) by mouth 2 (two) times daily.   OneTouch Delica Plus [id number] Misc Use to check blood sugar once daily   OneTouch Verio test strip Generic drug: glucose blood Use as directed to check blood sugars 1 time per day.   pantoprazole 40 MG tablet Commonly known as: PROTONIX TAKE 1 TABLET BY MOUTH  TWICE A DAY BEFORE MEALS What changed: See the new instructions.   rosuvastatin 20 MG tablet Commonly known as: CRESTOR TAKE 1 TABLET BY MOUTH ONCE DAILY What changed: how much to take   trolamine salicylate 10 % cream Commonly known as: ASPERCREME Apply 1 application topically daily as needed for muscle pain.   vitamin B-12 100 MCG tablet Commonly known as: CYANOCOBALAMIN Take 100 mcg by mouth daily.   vitamin C 1000 MG tablet Take 1,000 mg by mouth daily.        Follow-up Information     Donato Heinz, MD Follow up.   Specialties: Cardiology, Radiology Why: Hospital follow-up with Cardiology scheduled for 05/16/2021 at 11:30am. Please arrive 15 minutes early for check-in. If this date/time does not work for you, please call our office to reschedule. Contact information: [address] [place] [address] [phone]                Allergies  Allergen Reactions   Amoxicillin Diarrhea    Has patient had a PCN reaction causing immediate rash, facial/tongue/throat swelling, SOB or lightheadedness with hypotension: no Has patient had a PCN reaction causing severe rash involving mucus membranes or skin necrosis: no Has patient had a PCN reaction that required hospitalization: no pharmacist consult Has patient had a PCN reaction occurring within the last 10 years: yes If all of the above answers are "NO", then may proceed with Cephalosporin use.    Ampicillin Rash    - Tolerates Rocephin and Ancef - Remote occurrence; no symptoms of anaphylaxis or severe cutaneous reaction, and no additional medical attention required     Sulfa Antibiotics Rash    Consultations: Cardiology  Discharge Exam: BP 129/74   Pulse 72   Temp 98.2 F (36.8 C) (Oral)   Resp 18   Ht 5' 5.5" (1.664 m)   Wt 60.8 kg   LMP  (LMP Unknown)   SpO2 97%   BMI 21.97 kg/m  General appearance: alert, cooperative, and no distress; very pleasant  Head:  Normocephalic, without obvious abnormality, atraumatic Eyes:  EOMI Lungs: clear to auscultation bilaterally Heart: regular rate and rhythm and S1, S2 normal Abdomen: normal findings: bowel sounds normal and soft, non-tender Extremities:  no edema Skin: mobility and turgor normal Neurologic: Grossly normal  The results of significant diagnostics from this hospitalization (including imaging, microbiology, ancillary and laboratory) are listed below  for reference.   Microbiology: Recent Results (from the past 240 hour(s))  Resp Panel by RT-PCR (Flu A&B, Covid) Nasopharyngeal Swab     Status: None   Collection Time: 03/31/21  7:59 PM   Specimen: Nasopharyngeal Swab; Nasopharyngeal(NP) swabs in vial transport medium  Result Value Ref Range Status   SARS Coronavirus 2 by RT PCR NEGATIVE NEGATIVE Final    Comment: (NOTE) SARS-CoV-2 target nucleic acids are NOT DETECTED.  The SARS-CoV-2 RNA is generally detectable in upper respiratory specimens during the acute phase of infection. The lowest concentration of SARS-CoV-2 viral copies this assay can detect is 138 copies/mL. A negative result does not preclude SARS-Cov-2 infection and should not be used as the sole basis for treatment or other patient management decisions. A negative result may occur with  improper specimen collection/handling, submission of specimen other than nasopharyngeal swab, presence of viral mutation(s) within the areas targeted by this assay, and inadequate number of viral copies(<138 copies/mL). A negative result must be combined with clinical observations, patient history, and epidemiological information. The expected result is Negative.  Fact Sheet for Patients:  EntrepreneurPulse.com.au  Fact Sheet for Healthcare Providers:  IncredibleEmployment.be  This test is no t yet approved or cleared by the [place] FDA and  has been authorized for detection and/or diagnosis of  SARS-CoV-2 by FDA under an Emergency Use Authorization (EUA). This EUA will remain  in effect (meaning this test can be used) for the duration of the COVID-19 declaration under Section 564(b)(1) of the Act, 21 U.S.C.section 360bbb-3(b)(1), unless the authorization is terminated  or revoked sooner.       Influenza A by PCR NEGATIVE NEGATIVE Final   Influenza B by PCR NEGATIVE NEGATIVE Final    Comment: (NOTE) The Xpert Xpress SARS-CoV-2/FLU/RSV plus assay is intended as an aid in the diagnosis of influenza from Nasopharyngeal swab specimens and should not be used as a sole basis for treatment. Nasal washings and aspirates are unacceptable for Xpert Xpress SARS-CoV-2/FLU/RSV testing.  Fact Sheet for Patients: EntrepreneurPulse.com.au  Fact Sheet for Healthcare Providers: IncredibleEmployment.be  This test is not yet approved or cleared by the [place] FDA and has been authorized for detection and/or diagnosis of SARS-CoV-2 by FDA under an Emergency Use Authorization (EUA). This EUA will remain in effect (meaning this test can be used) for the duration of the COVID-19 declaration under Section 564(b)(1) of the Act, 21 U.S.C. section 360bbb-3(b)(1), unless the authorization is terminated or revoked.  Performed at [facility], [place] [address], [place], [place] [address]   Respiratory (~20 pathogens) panel by PCR     Status: None   Collection Time: 04/05/21  2:55 PM   Specimen: Nasopharyngeal Swab; Respiratory  Result Value Ref Range Status   Adenovirus NOT DETECTED NOT DETECTED Final   Coronavirus 229E NOT DETECTED NOT DETECTED Final    Comment: (NOTE) The Coronavirus on the Respiratory Panel, DOES NOT test for the novel  Coronavirus (2019 nCoV)    Coronavirus HKU1 NOT DETECTED NOT DETECTED Final   Coronavirus NL63 NOT DETECTED NOT DETECTED Final   Coronavirus OC43 NOT DETECTED NOT DETECTED Final   Metapneumovirus  NOT DETECTED NOT DETECTED Final   Rhinovirus / Enterovirus NOT DETECTED NOT DETECTED Final   Influenza A NOT DETECTED NOT DETECTED Final   Influenza B NOT DETECTED NOT DETECTED Final   Parainfluenza Virus 1 NOT DETECTED NOT DETECTED Final   Parainfluenza Virus 2 NOT DETECTED NOT DETECTED Final   Parainfluenza Virus 3 NOT  DETECTED NOT DETECTED Final   Parainfluenza Virus 4 NOT DETECTED NOT DETECTED Final   Respiratory Syncytial Virus NOT DETECTED NOT DETECTED Final   Bordetella pertussis NOT DETECTED NOT DETECTED Final   Bordetella Parapertussis NOT DETECTED NOT DETECTED Final   Chlamydophila pneumoniae NOT DETECTED NOT DETECTED Final   Mycoplasma pneumoniae NOT DETECTED NOT DETECTED Final    Comment: Performed at [facility], [place] [address] [place], [place] [address]     Labs: BNP (last 3 results) No results for input(s): BNP in the last 8760 hours. Basic Metabolic Panel: Recent Labs  Lab 04/05/21 0352 04/06/21 0343 04/07/21 0958 04/08/21 0307 04/09/21 0307  NA 138 135 135 140 136  K 3.5 3.5 3.5 3.7 3.6  CL 107 108 107 114* 110  CO2 25 23 18* 18* 18*  GLUCOSE 110* 104* 156* 126* 118*  BUN '21 18 20 20 21  ' CREATININE 0.88 0.79 1.04* 1.09* 0.98  CALCIUM 8.7* 8.5* 9.1 8.9 8.8*  MG 1.9 2.0 2.0 1.9 1.9  PHOS  --   --  3.1  --   --    Liver Function Tests: Recent Labs  Lab 04/07/21 0958 04/08/21 0307 04/09/21 0307  AST '18 18 16  ' ALT '17 15 16  ' ALKPHOS 52 46 46  BILITOT 0.7 0.4 0.6  PROT 6.7 6.0* 6.1*  ALBUMIN 3.5 3.1* 3.3*   No results for input(s): LIPASE, AMYLASE in the last 168 hours. No results for input(s): AMMONIA in the last 168 hours. CBC: Recent Labs  Lab 04/04/21 0355 04/05/21 0352 04/06/21 0343 04/07/21 0958 04/08/21 0307 04/09/21 0307  WBC 13.0* 9.3 7.6 10.7* 9.2 9.5  NEUTROABS 9.8* 5.5 4.2  --  6.0 5.9  HGB 12.3 11.9* 11.9* 12.8 11.0* 11.3*  HCT 37.8 36.6 36.2 39.0 33.8* 34.7*  MCV 88.9 90.4 88.7 88.6 89.9 89.7  PLT 401* 377 412* 421*  406* 429*   Cardiac Enzymes: No results for input(s): CKTOTAL, CKMB, CKMBINDEX, TROPONINI in the last 168 hours. BNP: Invalid input(s): POCBNP CBG: Recent Labs  Lab 04/05/21 0736 04/05/21 1220 04/05/21 1717 04/05/21 2000 04/07/21 0954  GLUCAP 104* 106* 102* 145* 163*   D-Dimer No results for input(s): DDIMER in the last 72 hours. Hgb A1c No results for input(s): HGBA1C in the last 72 hours. Lipid Profile No results for input(s): CHOL, HDL, LDLCALC, TRIG, CHOLHDL, LDLDIRECT in the last 72 hours. Thyroid function studies No results for input(s): TSH, T4TOTAL, T3FREE, THYROIDAB in the last 72 hours.  Invalid input(s): FREET3 Anemia work up No results for input(s): VITAMINB12, FOLATE, FERRITIN, TIBC, IRON, RETICCTPCT in the last 72 hours. Urinalysis    Component Value Date/Time   COLORURINE YELLOW 03/31/2021 1800   APPEARANCEUR CLEAR 03/31/2021 1800   LABSPEC 1.009 03/31/2021 1800   PHURINE 7.0 03/31/2021 1800   GLUCOSEU NEGATIVE 03/31/2021 1800   HGBUR NEGATIVE 03/31/2021 1800   BILIRUBINUR NEGATIVE 03/31/2021 1800   BILIRUBINUR Negative 11/13/2020 0738   KETONESUR NEGATIVE 03/31/2021 1800   PROTEINUR NEGATIVE 03/31/2021 1800   UROBILINOGEN 0.2 11/13/2020 0738   UROBILINOGEN 0.2 09/01/2014 0300   NITRITE NEGATIVE 03/31/2021 1800   LEUKOCYTESUR MODERATE (A) 03/31/2021 1800   Sepsis Labs Invalid input(s): PROCALCITONIN,  WBC,  LACTICIDVEN Microbiology Recent Results (from the past 240 hour(s))  Resp Panel by RT-PCR (Flu A&B, Covid) Nasopharyngeal Swab     Status: None   Collection Time: 03/31/21  7:59 PM   Specimen: Nasopharyngeal Swab; Nasopharyngeal(NP) swabs in vial transport medium  Result Value Ref  Range Status   SARS Coronavirus 2 by RT PCR NEGATIVE NEGATIVE Final    Comment: (NOTE) SARS-CoV-2 target nucleic acids are NOT DETECTED.  The SARS-CoV-2 RNA is generally detectable in upper respiratory specimens during the acute phase of infection. The  lowest concentration of SARS-CoV-2 viral copies this assay can detect is 138 copies/mL. A negative result does not preclude SARS-Cov-2 infection and should not be used as the sole basis for treatment or other patient management decisions. A negative result may occur with  improper specimen collection/handling, submission of specimen other than nasopharyngeal swab, presence of viral mutation(s) within the areas targeted by this assay, and inadequate number of viral copies(<138 copies/mL). A negative result must be combined with clinical observations, patient history, and epidemiological information. The expected result is Negative.  Fact Sheet for Patients:  EntrepreneurPulse.com.au  Fact Sheet for Healthcare Providers:  IncredibleEmployment.be  This test is no t yet approved or cleared by the [place] FDA and  has been authorized for detection and/or diagnosis of SARS-CoV-2 by FDA under an Emergency Use Authorization (EUA). This EUA will remain  in effect (meaning this test can be used) for the duration of the COVID-19 declaration under Section 564(b)(1) of the Act, 21 U.S.C.section 360bbb-3(b)(1), unless the authorization is terminated  or revoked sooner.       Influenza A by PCR NEGATIVE NEGATIVE Final   Influenza B by PCR NEGATIVE NEGATIVE Final    Comment: (NOTE) The Xpert Xpress SARS-CoV-2/FLU/RSV plus assay is intended as an aid in the diagnosis of influenza from Nasopharyngeal swab specimens and should not be used as a sole basis for treatment. Nasal washings and aspirates are unacceptable for Xpert Xpress SARS-CoV-2/FLU/RSV testing.  Fact Sheet for Patients: EntrepreneurPulse.com.au  Fact Sheet for Healthcare Providers: IncredibleEmployment.be  This test is not yet approved or cleared by the [place] FDA and has been authorized for detection and/or diagnosis of SARS-CoV-2 by FDA under  an Emergency Use Authorization (EUA). This EUA will remain in effect (meaning this test can be used) for the duration of the COVID-19 declaration under Section 564(b)(1) of the Act, 21 U.S.C. section 360bbb-3(b)(1), unless the authorization is terminated or revoked.  Performed at [facility], [place] [address], [place], [place] [address]   Respiratory (~20 pathogens) panel by PCR     Status: None   Collection Time: 04/05/21  2:55 PM   Specimen: Nasopharyngeal Swab; Respiratory  Result Value Ref Range Status   Adenovirus NOT DETECTED NOT DETECTED Final   Coronavirus 229E NOT DETECTED NOT DETECTED Final    Comment: (NOTE) The Coronavirus on the Respiratory Panel, DOES NOT test for the novel  Coronavirus (2019 nCoV)    Coronavirus HKU1 NOT DETECTED NOT DETECTED Final   Coronavirus NL63 NOT DETECTED NOT DETECTED Final   Coronavirus OC43 NOT DETECTED NOT DETECTED Final   Metapneumovirus NOT DETECTED NOT DETECTED Final   Rhinovirus / Enterovirus NOT DETECTED NOT DETECTED Final   Influenza A NOT DETECTED NOT DETECTED Final   Influenza B NOT DETECTED NOT DETECTED Final   Parainfluenza Virus 1 NOT DETECTED NOT DETECTED Final   Parainfluenza Virus 2 NOT DETECTED NOT DETECTED Final   Parainfluenza Virus 3 NOT DETECTED NOT DETECTED Final   Parainfluenza Virus 4 NOT DETECTED NOT DETECTED Final   Respiratory Syncytial Virus NOT DETECTED NOT DETECTED Final   Bordetella pertussis NOT DETECTED NOT DETECTED Final   Bordetella Parapertussis NOT DETECTED NOT DETECTED Final   Chlamydophila pneumoniae NOT DETECTED NOT DETECTED Final  Mycoplasma pneumoniae NOT DETECTED NOT DETECTED Final    Comment: Performed at [facility], [place] [address], [place], [place] [address]    Procedures/Studies: DG Ribs Unilateral W/Chest Left  Result Date: 03/31/2021 CLINICAL DATA:  Left rib pain, fall EXAM: LEFT RIBS AND CHEST - 3+ VIEW COMPARISON:  November 07, 2017 FINDINGS: The cardiomediastinal  silhouette is unchanged in contour.Tortuous thoracic aorta. Atherosclerotic calcifications. No pleural effusion. No pneumothorax. Scattered nodular opacity of the RIGHT upper lobe, likely infectious or inflammatory in etiology. Visualized abdomen is unremarkable. Remote RIGHT clavicular fracture, undulating contour of multiple LEFT-sided ribs consistent with sequela of remote fracture. Subjacent to the site of painful concern marker, no definitive acute displaced rib fracture visualized. There is a subtle lucency along the costochondral junction which could reflect a nondisplaced fracture at the site of painful concern. IMPRESSION: 1. There is a possible nondisplaced rib fracture subjacent to the site of palpable concern. No acute displaced rib fracture visualized. There is a sequela of multiple favored remote LEFT-sided rib fractures. Electronically Signed   By: Valentino Saxon M.D.   On: 03/31/2021 12:18   CT Head Wo Contrast  Result Date: 03/31/2021 CLINICAL DATA:  Head trauma, minor (Age >= 65y); Neck trauma (Age >= 65y); Facial trauma EXAM: CT HEAD WITHOUT CONTRAST CT MAXILLOFACIAL WITHOUT CONTRAST CT CERVICAL SPINE WITHOUT CONTRAST TECHNIQUE: Multidetector CT imaging of the head, cervical spine, and maxillofacial structures were performed using the standard protocol without intravenous contrast. Multiplanar CT image reconstructions of the cervical spine and maxillofacial structures were also generated. COMPARISON:  February 13, 2019, October 06, 2020 FINDINGS: CT HEAD FINDINGS Brain: No evidence of acute infarction, hemorrhage, hydrocephalus, extra-axial collection or mass lesion/mass effect. Global parenchymal volume loss. Periventricular white matter hypodensities consistent with sequela of chronic microvascular ischemic disease. Vascular: Vascular calcifications. Skull: Normal. Negative for fracture or focal lesion. Other: None. CT MAXILLOFACIAL FINDINGS Osseous: No fracture or mandibular dislocation. No  destructive process. Well corticated ossific density of the LEFT TMJ consistent with sequela of remote prior trauma or degenerative changes, unchanged. Orbits: Negative. No traumatic or inflammatory finding. Sinuses: Unchanged mucosal thickening of the LEFT sphenoid sinus. Mild RIGHT frontal sinus mucosal thickening. Soft tissues: There is a 20 mm RIGHT thyroid nodule. CT CERVICAL SPINE FINDINGS Alignment: Unchanged 4 mm of anterolisthesis of C3-4. No acute traumatic listhesis is identified. Skull base and vertebrae: No acute fracture. No primary bone lesion or focal pathologic process. Soft tissues and spinal canal: No prevertebral fluid or swelling. No visible canal hematoma. Disc levels: Severe intervertebral disc space height loss at C5-6, C6-7, C7-T1. Moderate intervertebral disc space height loss of C4-5. Multilevel endplate proliferative changes and uncovertebral hypertrophy as well as facet arthropathy. This results in multilevel osseous neuroforaminal narrowing. No high-grade canal stenosis. Upper chest: Atherosclerotic calcifications. Other: None. IMPRESSION: 1.  No acute intracranial abnormality. 2. No acute fracture or acute traumatic subluxation of the cervical spine. 3. No acute facial bone fracture. 4. There is an 20 mm RIGHT thyroid nodule. Recommend nonemergent thyroid US (ref: J Am Coll Radiol. 2015 Feb;12(2): 143-50). Aortic Atherosclerosis (ICD10-I70.0). Electronically Signed   By: Valentino Saxon M.D.   On: 03/31/2021 12:36   CT Cervical Spine Wo Contrast  Result Date: 03/31/2021 CLINICAL DATA:  Head trauma, minor (Age >= 65y); Neck trauma (Age >= 65y); Facial trauma EXAM: CT HEAD WITHOUT CONTRAST CT MAXILLOFACIAL WITHOUT CONTRAST CT CERVICAL SPINE WITHOUT CONTRAST TECHNIQUE: Multidetector CT imaging of the head, cervical spine, and maxillofacial structures were performed  using the standard protocol without intravenous contrast. Multiplanar CT image reconstructions of the cervical spine  and maxillofacial structures were also generated. COMPARISON:  February 13, 2019, October 06, 2020 FINDINGS: CT HEAD FINDINGS Brain: No evidence of acute infarction, hemorrhage, hydrocephalus, extra-axial collection or mass lesion/mass effect. Global parenchymal volume loss. Periventricular white matter hypodensities consistent with sequela of chronic microvascular ischemic disease. Vascular: Vascular calcifications. Skull: Normal. Negative for fracture or focal lesion. Other: None. CT MAXILLOFACIAL FINDINGS Osseous: No fracture or mandibular dislocation. No destructive process. Well corticated ossific density of the LEFT TMJ consistent with sequela of remote prior trauma or degenerative changes, unchanged. Orbits: Negative. No traumatic or inflammatory finding. Sinuses: Unchanged mucosal thickening of the LEFT sphenoid sinus. Mild RIGHT frontal sinus mucosal thickening. Soft tissues: There is a 20 mm RIGHT thyroid nodule. CT CERVICAL SPINE FINDINGS Alignment: Unchanged 4 mm of anterolisthesis of C3-4. No acute traumatic listhesis is identified. Skull base and vertebrae: No acute fracture. No primary bone lesion or focal pathologic process. Soft tissues and spinal canal: No prevertebral fluid or swelling. No visible canal hematoma. Disc levels: Severe intervertebral disc space height loss at C5-6, C6-7, C7-T1. Moderate intervertebral disc space height loss of C4-5. Multilevel endplate proliferative changes and uncovertebral hypertrophy as well as facet arthropathy. This results in multilevel osseous neuroforaminal narrowing. No high-grade canal stenosis. Upper chest: Atherosclerotic calcifications. Other: None. IMPRESSION: 1.  No acute intracranial abnormality. 2. No acute fracture or acute traumatic subluxation of the cervical spine. 3. No acute facial bone fracture. 4. There is an 20 mm RIGHT thyroid nodule. Recommend nonemergent thyroid US (ref: J Am Coll Radiol. 2015 Feb;12(2): 143-50). Aortic Atherosclerosis  (ICD10-I70.0). Electronically Signed   By: Valentino Saxon M.D.   On: 03/31/2021 12:36   DG CHEST PORT 1 VIEW  Result Date: 04/05/2021 CLINICAL DATA:  Cough for 3 days. Pneumonia 1 month ago. History of hypertension and diabetes. EXAM: PORTABLE CHEST 1 VIEW COMPARISON:  Radiographs 03/31/2021 and 11/14/2020.  CT 11/07/2017. FINDINGS: 1040 hours. Lower lung volumes. Allowing for this, the heart size and mediastinal contours are stable with aortic atherosclerosis. The lungs are clear. There is no pleural effusion or pneumothorax. Old fractures of the distal right clavicle and left ribs are noted. IMPRESSION: Suboptimal inspiration. No active cardiopulmonary process demonstrated. Electronically Signed   By: Richardean Sale M.D.   On: 04/05/2021 13:31   MR CARDIAC MORPHOLOGY W WO CONTRAST  Result Date: 04/03/2021 CLINICAL DATA:  HCM evaluation EXAM: CARDIAC MRI TECHNIQUE: The patient was scanned on a 1.5 Tesla Siemens magnet. A dedicated cardiac coil was used. Functional imaging was done using Fiesta sequences. 2,3, and 4 chamber views were done to assess for RWMA's. Modified Simpson's rule using a short axis stack was used to calculate an ejection fraction on a dedicated work Conservation officer, nature. The patient received 10 cc of Gadavist. After 10 minutes inversion recovery sequences were used to assess for infiltration and scar tissue. CONTRAST:  10 cc  of Gadavist FINDINGS: Left ventricle: -Basal septal thickening measuring up to 38m (160min posterior wall) -Small LV size -Normal systolic function -Mild ECV elevation (32%) -Patchy LGE primarily at RV insertion site accounting for 2% of total myocardial mass LV EF: 63% (Normal 56-78%) Absolute volumes: LV EDV: 7772mNormal 77-195 mL) LV ESV: 52m72mormal 19-72 mL) LV SV: 49mL20mrmal 51-133 mL) CO: 6.2L/min (Normal 2.8-8.8 L/min) Indexed volumes: LV EDV: 46mL/46m (Normal 47-92 mL/sq-m) LV ESV: 17mL/s70m(Normal 13-30 mL/sq-m) LV SV: 29mL/sq10m(Normal  32-62 mL/sq-m) CI: 3.8L/min/sq-m (Normal 1.7-4.2 L/min/sq-m) Right ventricle: Small size, normal systolic function RV EF:  66% (Normal 47-74%) Absolute volumes: RV EDV: 71m (Normal 88-227 mL) RV ESV: 254m(Normal 23-103 mL) RV SV: 5057mNormal 52-138 mL) CO: 6.5L/min (Normal 2.8-8.8 L/min) Indexed volumes: RV EDV: 30m42m-m (Normal 55-105 mL/sq-m) RV ESV: 15mL35mm (Normal 15-43 mL/sq-m) RV SV: 30mL/76m (Normal 32-64 mL/sq-m) CI: L/min/sq-m (Normal 1.7-4.2 L/min/sq-m) Left atrium: Mild enlargement Right atrium: Normal size Mitral valve: Mild regurgitation Aortic valve: No regurgitation Tricuspid valve: No regurgitation Pulmonic valve: No regurgitation Aorta: Mild ascending aortic dilatation measuring 37mm P94mardium: Normal IMPRESSION: 1. Asymmetric LV hypertrophy measuring up to 19mm in67mal septum (11mm in 65merior wall), consistent with hypertrophic cardiomyopathy 2. Patchy late gadolinium enhancement primarily at RV insertion site, consistent with HCM. LGE accounts for 2% of total myocardial mass 3.  Small LV size with normal systolic function (EF 63%) 4.  40%ll RV size with normal systolic function (EF 66%) Elec08%nically Signed   By: ChristophOswaldo Miliann: 04/03/2021 19:22   ECHOCARDIOGRAM COMPLETE  Result Date: 04/01/2021    ECHOCARDIOGRAM REPORT   Patient Name:   Anna JONEKAILANA BENNINGERe of Exam: 04/01/2021 Medical Rec #:  [id number]:       65.5 in Accession #:    [id number]:       134.0 lb Date of Birth:  04/24/1935 07/03/38SA:          1.678 m Patient Age:    86 years 25     BP:           159/95 mmHg Patient Gender: F               HR:           88 bpm. Exam Location:  Inpatient Procedure: 2D Echo, 3D Echo, Cardiac Doppler, Color Doppler and Strain Analysis Indications:    R55 Syncope  History:        Patient has prior history of Echocardiogram examinations, most                 recent 04/22/2019. Abnormal ECG, Signs/Symptoms:Syncope; Risk                  Factors:Hypertension, Dyslipidemia and Diabetes. ICH.  Sonographer:    Tina WestRoseanna Rainbowerring Phys: [id number] NA [phone] Comments: Technically difficult study due to poor echo windows. Global longitudinal strain was attempted. Patient talking and yawning throughout exam. IMPRESSIONS  1. Left ventricular ejection fraction, by estimation, is 65 to 70%. The left ventricle has hyperdynamic function. The left ventricle has no regional wall motion abnormalities. There is severe focal basal septal left ventricular hypertrophy, no LV outflow tract gradient. Left ventricular diastolic parameters are consistent with Grade I diastolic dysfunction (impaired relaxation). The average left ventricular global longitudinal strain is -20.7 %. The global longitudinal strain is normal.  2. Right ventricular systolic function is normal. The right ventricular size is normal. There is normal pulmonary artery systolic pressure. The estimated right ventricular systolic pressure is 17.0 mmHg82.5. Left atrial size was mildly dilated.  4. The mitral valve is normal in structure. Trivial mitral valve regurgitation. No evidence of mitral stenosis.  5. The aortic valve is tricuspid. Aortic valve regurgitation is mild. Mild aortic valve sclerosis is present, with no evidence of aortic valve stenosis.  6. Aortic  dilatation noted. There is mild dilatation of the ascending aorta, measuring 41 mm.  7. The inferior vena cava is normal in size with greater than 50% respiratory variability, suggesting right atrial pressure of 3 mmHg. FINDINGS  Left Ventricle: Left ventricular ejection fraction, by estimation, is 65 to 70%. The left ventricle has hyperdynamic function. The left ventricle has no regional wall motion abnormalities. The average left ventricular global longitudinal strain is -20.7  %. The global longitudinal strain is normal. The left ventricular internal cavity size was normal in size. There is severe focal basal septal left ventricular  hypertrophy. Left ventricular diastolic parameters are consistent with Grade I diastolic dysfunction (impaired relaxation). Right Ventricle: The right ventricular size is normal. No increase in right ventricular wall thickness. Right ventricular systolic function is normal. There is normal pulmonary artery systolic pressure. The tricuspid regurgitant velocity is 1.87 m/s, and  with an assumed right atrial pressure of 3 mmHg, the estimated right ventricular systolic pressure is 52.7 mmHg. Left Atrium: Left atrial size was mildly dilated. Right Atrium: Right atrial size was normal in size. Pericardium: There is no evidence of pericardial effusion. Mitral Valve: The mitral valve is normal in structure. There is mild calcification of the mitral valve leaflet(s). Mild mitral annular calcification. Trivial mitral valve regurgitation. No evidence of mitral valve stenosis. Tricuspid Valve: The tricuspid valve is normal in structure. Tricuspid valve regurgitation is trivial. Aortic Valve: The aortic valve is tricuspid. Aortic valve regurgitation is mild. Aortic regurgitation PHT measures 584 msec. Mild aortic valve sclerosis is present, with no evidence of aortic valve stenosis. Pulmonic Valve: The pulmonic valve was normal in structure. Pulmonic valve regurgitation is trivial. Aorta: The aortic root is normal in size and structure and aortic dilatation noted. There is mild dilatation of the ascending aorta, measuring 41 mm. Venous: The inferior vena cava is normal in size with greater than 50% respiratory variability, suggesting right atrial pressure of 3 mmHg. IAS/Shunts: No atrial level shunt detected by color flow Doppler.  LEFT VENTRICLE PLAX 2D LVIDd:         2.00 cm     Diastology LVIDs:         1.40 cm     LV e' medial:    3.37 cm/s LV PW:         1.30 cm     LV E/e' medial:  24.9 LV IVS:        2.30 cm     LV e' lateral:   3.59 cm/s LVOT diam:     2.00 cm     LV E/e' lateral: 23.4 LV SV:         50 LV SV Index:   30           2D Longitudinal Strain LVOT Area:     3.14 cm    2D Strain GLS Avg:     -20.7 %  LV Volumes (MOD) LV vol d, MOD A2C: 42.6 ml 3D Volume EF: LV vol d, MOD A4C: 43.7 ml 3D EF:        63 % LV vol s, MOD A2C: 10.5 ml LV EDV:       73 ml LV vol s, MOD A4C: 9.6 ml  LV ESV:       27 ml LV SV MOD A2C:     32.1 ml LV SV:        46 ml LV SV MOD A4C:     43.7 ml LV SV MOD BP:  33.2 ml RIGHT VENTRICLE            IVC RV S prime:     9.46 cm/s  IVC diam: 1.50 cm TAPSE (M-mode): 1.7 cm LEFT ATRIUM             Index       RIGHT ATRIUM           Index LA diam:        3.90 cm 2.32 cm/m  RA Area:     13.10 cm LA Vol (A2C):   26.9 ml 16.03 ml/m RA Volume:   33.50 ml  19.96 ml/m LA Vol (A4C):   45.7 ml 27.23 ml/m LA Biplane Vol: 37.8 ml 22.52 ml/m  AORTIC VALVE             PULMONIC VALVE LVOT Vmax:   93.00 cm/s  PR End Diast Vel: 1.59 msec LVOT Vmean:  59.600 cm/s LVOT VTI:    0.159 m AI PHT:      584 msec  AORTA Ao Root diam: 3.50 cm MITRAL VALVE               TRICUSPID VALVE MV Area (PHT): 6.17 cm    TR Peak grad:   14.0 mmHg MV Decel Time: 123 msec    TR Vmax:        187.00 cm/s MV E velocity: 84.00 cm/s                            SHUNTS                            Systemic VTI:  0.16 m                            Systemic Diam: 2.00 cm Dalton McleanMD Electronically signed by Franki Monte Signature Date/Time: 04/01/2021/10:51:55 AM    Final    VAS US CAROTID  Result Date: 04/01/2021 Carotid Arterial Duplex Study Patient Name:  JOLENE GUYETT Cendejas  Date of Exam:   04/01/2021 Medical Rec #: [id number]        Accession #:    [id number] Date of Birth: August 17, 1934         Patient Gender: F Patient Age:   29 years Exam Location:  [facility] Procedure:      VAS US CAROTID Referring Phys: Skipper Cliche --------------------------------------------------------------------------------  Indications:       Syncope. Risk Factors:      Hypertension, hyperlipidemia, Diabetes. Comparison Study:  No prior studies.  Performing Technologist: Oliver Hum RVT  Examination Guidelines: A complete evaluation includes B-mode imaging, spectral Doppler, color Doppler, and power Doppler as needed of all accessible portions of each vessel. Bilateral testing is considered an integral part of a complete examination. Limited examinations for reoccurring indications may be performed as noted.  Right Carotid Findings: +----------+--------+--------+--------+-----------------------+--------+           PSV cm/sEDV cm/sStenosisPlaque Description     Comments +----------+--------+--------+--------+-----------------------+--------+ CCA Prox  70      16              smooth and heterogenoustortuous +----------+--------+--------+--------+-----------------------+--------+ CCA Distal46      13              smooth and heterogenous         +----------+--------+--------+--------+-----------------------+--------+ ICA Prox  50  18              smooth and heterogenous         +----------+--------+--------+--------+-----------------------+--------+ ICA Distal45      12                                     tortuous +----------+--------+--------+--------+-----------------------+--------+ ECA       81      10                                              +----------+--------+--------+--------+-----------------------+--------+ +----------+--------+-------+--------+-------------------+           PSV cm/sEDV cmsDescribeArm Pressure (mmHG) +----------+--------+-------+--------+-------------------+ [id number]                                         +----------+--------+-------+--------+-------------------+ +---------+--------+--+--------+--+---------+ VertebralPSV cm/s45EDV cm/s10Antegrade +---------+--------+--+--------+--+---------+  Left Carotid Findings: +----------+--------+--------+--------+-----------------------+--------+           PSV cm/sEDV cm/sStenosisPlaque Description     Comments  +----------+--------+--------+--------+-----------------------+--------+ CCA Prox  76      15              smooth and heterogenoustortuous +----------+--------+--------+--------+-----------------------+--------+ CCA Distal44      9               smooth and heterogenous         +----------+--------+--------+--------+-----------------------+--------+ ICA Prox  39      12              smooth and heterogenoustortuous +----------+--------+--------+--------+-----------------------+--------+ ICA Distal54      19                                     tortuous +----------+--------+--------+--------+-----------------------+--------+ ECA       65      4                                               +----------+--------+--------+--------+-----------------------+--------+ +----------+--------+--------+--------+-------------------+           PSV cm/sEDV cm/sDescribeArm Pressure (mmHG) +----------+--------+--------+--------+-------------------+ [id number]                                          +----------+--------+--------+--------+-------------------+ +---------+--------+--+--------+-+---------+ VertebralPSV cm/s34EDV cm/s8Antegrade +---------+--------+--+--------+-+---------+   Summary: Right Carotid: Velocities in the right ICA are consistent with a 1-39% stenosis. Left Carotid: Velocities in the left ICA are consistent with a 1-39% stenosis. Vertebrals: Bilateral vertebral arteries demonstrate antegrade flow. *See table(s) above for measurements and observations.  Electronically signed by Antony Contras MD on 04/01/2021 at 1:36:32 PM.    Final    CT Maxillofacial WO CM  Result Date: 03/31/2021 CLINICAL DATA:  Head trauma, minor (Age >= 65y); Neck trauma (Age >= 65y); Facial trauma EXAM: CT HEAD WITHOUT CONTRAST CT MAXILLOFACIAL WITHOUT CONTRAST CT CERVICAL SPINE WITHOUT CONTRAST TECHNIQUE: Multidetector CT imaging of the head, cervical spine, and maxillofacial structures  were performed using the standard  protocol without intravenous contrast. Multiplanar CT image reconstructions of the cervical spine and maxillofacial structures were also generated. COMPARISON:  February 13, 2019, October 06, 2020 FINDINGS: CT HEAD FINDINGS Brain: No evidence of acute infarction, hemorrhage, hydrocephalus, extra-axial collection or mass lesion/mass effect. Global parenchymal volume loss. Periventricular white matter hypodensities consistent with sequela of chronic microvascular ischemic disease. Vascular: Vascular calcifications. Skull: Normal. Negative for fracture or focal lesion. Other: None. CT MAXILLOFACIAL FINDINGS Osseous: No fracture or mandibular dislocation. No destructive process. Well corticated ossific density of the LEFT TMJ consistent with sequela of remote prior trauma or degenerative changes, unchanged. Orbits: Negative. No traumatic or inflammatory finding. Sinuses: Unchanged mucosal thickening of the LEFT sphenoid sinus. Mild RIGHT frontal sinus mucosal thickening. Soft tissues: There is a 20 mm RIGHT thyroid nodule. CT CERVICAL SPINE FINDINGS Alignment: Unchanged 4 mm of anterolisthesis of C3-4. No acute traumatic listhesis is identified. Skull base and vertebrae: No acute fracture. No primary bone lesion or focal pathologic process. Soft tissues and spinal canal: No prevertebral fluid or swelling. No visible canal hematoma. Disc levels: Severe intervertebral disc space height loss at C5-6, C6-7, C7-T1. Moderate intervertebral disc space height loss of C4-5. Multilevel endplate proliferative changes and uncovertebral hypertrophy as well as facet arthropathy. This results in multilevel osseous neuroforaminal narrowing. No high-grade canal stenosis. Upper chest: Atherosclerotic calcifications. Other: None. IMPRESSION: 1.  No acute intracranial abnormality. 2. No acute fracture or acute traumatic subluxation of the cervical spine. 3. No acute facial bone fracture. 4. There is an 20 mm  RIGHT thyroid nodule. Recommend nonemergent thyroid US (ref: J Am Coll Radiol. 2015 Feb;12(2): 143-50). Aortic Atherosclerosis (ICD10-I70.0). Electronically Signed   By: Valentino Saxon M.D.   On: 03/31/2021 12:36     Time coordinating discharge: Over 30 minutes    Dwyane Dee, MD  [facility] 04/09/2021, 12:32 PM

## 2021-04-09 NOTE — Chronic Care Management (AMB) (Signed)
   Chronic Care Management   Inpatient Admit Review Note  04/09/2021 Name: Anna Hoffman MRN: [id number] DOB: [birth year]  Anna Hoffman is a 86 y.o. year old female who is a primary care patient of Glendale Chard, MD. Anna Hoffman is actively engaged with the embedded care management team in the primary care practice and is being followed by  [place] and BSW  for assistance with disease management and care coordination needs related to HTN and DM.   Anna Hoffman is currently admitted to the hospital for evaluation and treatment of  Syncope and collapse . Collaboration with patients primary provider, Dr. Baird Cancer who requests SW contact the patients son Anna Hoffman.   Successful outbound call placed to Anna Hoffman who expresses concern over finding his mom a bed for rehab. SW provided education to [place] on the process for placement explaining the inpatient team would assist with this. Performed chart review to note LCSW attempted to contact Anna Hoffman and left a voice message regarding placement. Anna Hoffman requests he be the point of contact. SW advised Anna Hoffman that this Probation officer would contact the inpatient team to request they outreach Anna Hoffman regarding rehab placement.  Plan: SW collaboration with Anna Phi, RN and Anna Cristal, LCSW requesting Anna Hoffman be contacted regarding rehab placement.     Anna Hoffman, BSW, CDP Social Worker, Certified Dementia Practitioner [place] [facility] [place] [facility] [phone]

## 2021-04-09 NOTE — Progress Notes (Signed)
Mobility Specialist - Progress Note    04/09/21 1437  Orthostatic Lying   BP- Lying 160/86  Pulse- Lying 74  Orthostatic Sitting  BP- Sitting 159/90  Pulse- Sitting 74  Orthostatic Standing at 0 minutes  BP- Standing at 0 minutes (!) 166/97  Pulse- Standing at 0 minutes 75  Orthostatic Standing at 3 minutes  BP- Standing at 3 minutes 166/88  Pulse- Standing at 3 minutes 69  Mobility  Activity Ambulated in hall;Transferred to/from [facility]  Level of Assistance Contact guard assist, steadying assist  Assistive Device Front wheel walker  Distance Ambulated (ft) 100 ft  Mobility Ambulated with assistance in hallway  Mobility Response Tolerated well  Mobility performed by Mobility specialist  $Mobility charge 1 Mobility    Pre-mobility: 74 HR, 159/90 BP Post-mobility: 69 HR, 153/72 BP  Upon entry pt was agreeable to ambulating in hallway using RW. BP was measured prior to moving due to pt being orthostatic throughout hospitalization and results recorded above. Pt did c/o of a headache and slight dizziness prior to ambulation and moved to [facility] to urinate before beginning ambulation. During ambulation pt did c/o  of constant dizziness, but stated it was "normal" and continued with session. Pt was returned to chair after session, left with call bell at side and chair alarm on. RN informed of session.   [place] Specialist Acute Rehabilitation Services Phone: [phone] 04/09/21, 2:43 PM

## 2021-04-09 NOTE — TOC Progression Note (Signed)
Transition of Care [facility]) - Progression Note    Patient Details  Name: Anna Hoffman MRN: [id number] Date of Birth: April 12, 1935  Transition of Care [facility]) CM/SW Contact  Purcell Mouton, RN Phone Number: 04/09/2021, 10:26 AM  Clinical Narrative:     Spoke with pt's son [name] to give bed offers for pt. [name] will return call at noon after checking on facilities.       Expected Discharge Plan and Services                                                 Social Determinants of Health (SDOH) Interventions    Readmission Risk Interventions No flowsheet data found.

## 2021-04-09 NOTE — Plan of Care (Signed)
  Problem: Clinical Measurements: Goal: Will remain free from infection Outcome: Progressing Goal: Diagnostic test results will improve Outcome: Progressing Goal: Respiratory complications will improve Outcome: Progressing Goal: Cardiovascular complication will be avoided Outcome: Progressing   

## 2021-04-09 NOTE — TOC Transition Note (Signed)
Transition of Care [facility]) - CM/SW Discharge Note   Patient Details  Name: Anna Hoffman MRN: [id number] Date of Birth: 1935/04/24  [facility] CM/SW Contact:  Ross Ludwig, LCSW Phone Number: 04/09/2021, 1:10 PM   Clinical Narrative:    Patient to be d/c'ed today to [facility] room 124.  Patient and family agreeable to plans will transport via ems RN to call report to [phone].     Final next level of care: Skilled Nursing Facility Barriers to Discharge: Barriers Resolved   Patient Goals and CMS Choice Patient states their goals for this hospitalization and ongoing recovery are:: To go to SNF for short term rehab, then return back home. CMS Medicare.gov Compare Post Acute Care list provided to:: Patient Represenative (must comment) Choice offered to / list presented to : Adult Children  Discharge Placement PASRR number recieved: 04/08/21            Patient [facility] bed at: [facility] Patient to be transferred to facility by: [facility] Name of family member notified: [name] Patient and family notified of of transfer: 04/09/21  Discharge Plan and Services                                     Social Determinants of Health (SDOH) Interventions     Readmission Risk Interventions No flowsheet data found.

## 2021-04-09 NOTE — TOC Progression Note (Signed)
Transition of Care [facility]) - Progression Note    Patient Details  Name: Anna Hoffman MRN: [id number] Date of Birth: 24-May-1935  Transition of Care [facility]) CM/SW Contact  Purcell Mouton, RN Phone Number: 04/09/2021, 12:19 PM  Clinical Narrative:    [name] returned called, selected [place]. Referral given to [place] at [facility]. Bed is ready need COVID test. Navi approved 8/6-8/8.        Expected Discharge Plan and Services                                                 Social Determinants of Health (SDOH) Interventions    Readmission Risk Interventions No flowsheet data found.

## 2021-04-09 NOTE — Progress Notes (Signed)
[place] gave report to [facility]. AVS given to PTAR, IV taken out. All questions and concerns addressed w/ new RN.

## 2021-04-10 DIAGNOSIS — E1122 Type 2 diabetes mellitus with diabetic chronic kidney disease: Secondary | ICD-10-CM | POA: Diagnosis not present

## 2021-04-11 DIAGNOSIS — E041 Nontoxic single thyroid nodule: Secondary | ICD-10-CM | POA: Diagnosis not present

## 2021-04-11 DIAGNOSIS — I10 Essential (primary) hypertension: Secondary | ICD-10-CM | POA: Diagnosis not present

## 2021-04-11 DIAGNOSIS — I421 Obstructive hypertrophic cardiomyopathy: Secondary | ICD-10-CM | POA: Diagnosis not present

## 2021-04-11 DIAGNOSIS — E1122 Type 2 diabetes mellitus with diabetic chronic kidney disease: Secondary | ICD-10-CM | POA: Diagnosis not present

## 2021-04-11 DIAGNOSIS — R55 Syncope and collapse: Secondary | ICD-10-CM | POA: Diagnosis not present

## 2021-04-17 ENCOUNTER — Ambulatory Visit (INDEPENDENT_AMBULATORY_CARE_PROVIDER_SITE_OTHER): Payer: Medicare Other

## 2021-04-17 ENCOUNTER — Encounter: Payer: Self-pay | Admitting: *Deleted

## 2021-04-17 NOTE — Progress Notes (Unsigned)
Patient enrolled for [employer] to mail a 14 day ZIO AT monitor to [facility], Attn: Renee Harder, in care of Fredna Stricker,  [address], [place], [place], [place]  [address]  [phone]. Letter with instructions mailed to same address.

## 2021-04-19 ENCOUNTER — Encounter: Payer: Self-pay | Admitting: Internal Medicine

## 2021-04-22 ENCOUNTER — Other Ambulatory Visit (HOSPITAL_COMMUNITY): Payer: Self-pay

## 2021-04-22 MED FILL — Pantoprazole Sodium EC Tab 40 MG (Base Equiv): ORAL | 30 days supply | Qty: 60 | Fill #0 | Status: AC

## 2021-04-23 ENCOUNTER — Other Ambulatory Visit (HOSPITAL_COMMUNITY): Payer: Self-pay

## 2021-04-23 ENCOUNTER — Other Ambulatory Visit: Payer: Self-pay

## 2021-04-23 ENCOUNTER — Encounter: Payer: Self-pay | Admitting: Internal Medicine

## 2021-04-23 ENCOUNTER — Ambulatory Visit (INDEPENDENT_AMBULATORY_CARE_PROVIDER_SITE_OTHER): Payer: Medicare Other | Admitting: Internal Medicine

## 2021-04-23 VITALS — BP 124/96 | HR 89 | Temp 98.3°F | Ht 63.2 in | Wt 135.4 lb

## 2021-04-23 DIAGNOSIS — E1122 Type 2 diabetes mellitus with diabetic chronic kidney disease: Secondary | ICD-10-CM

## 2021-04-23 DIAGNOSIS — R2681 Unsteadiness on feet: Secondary | ICD-10-CM

## 2021-04-23 DIAGNOSIS — I1310 Hypertensive heart and chronic kidney disease without heart failure, with stage 1 through stage 4 chronic kidney disease, or unspecified chronic kidney disease: Secondary | ICD-10-CM | POA: Diagnosis not present

## 2021-04-23 DIAGNOSIS — I129 Hypertensive chronic kidney disease with stage 1 through stage 4 chronic kidney disease, or unspecified chronic kidney disease: Secondary | ICD-10-CM

## 2021-04-23 DIAGNOSIS — Z23 Encounter for immunization: Secondary | ICD-10-CM | POA: Diagnosis not present

## 2021-04-23 DIAGNOSIS — I421 Obstructive hypertrophic cardiomyopathy: Secondary | ICD-10-CM | POA: Diagnosis not present

## 2021-04-23 DIAGNOSIS — J301 Allergic rhinitis due to pollen: Secondary | ICD-10-CM | POA: Diagnosis not present

## 2021-04-23 DIAGNOSIS — N1831 Chronic kidney disease, stage 3a: Secondary | ICD-10-CM | POA: Diagnosis not present

## 2021-04-23 DIAGNOSIS — H9193 Unspecified hearing loss, bilateral: Secondary | ICD-10-CM | POA: Diagnosis not present

## 2021-04-23 DIAGNOSIS — R55 Syncope and collapse: Secondary | ICD-10-CM

## 2021-04-23 DIAGNOSIS — Z79899 Other long term (current) drug therapy: Secondary | ICD-10-CM

## 2021-04-23 DIAGNOSIS — E041 Nontoxic single thyroid nodule: Secondary | ICD-10-CM | POA: Diagnosis not present

## 2021-04-23 MED ORDER — PANTOPRAZOLE SODIUM 40 MG PO TBEC
40.0000 mg | DELAYED_RELEASE_TABLET | Freq: Two times a day (BID) | ORAL | 2 refills | Status: DC
Start: 2021-04-23 — End: 2021-07-10
  Filled 2021-04-23: qty 180, 90d supply, fill #0

## 2021-04-23 MED ORDER — METOPROLOL TARTRATE 50 MG PO TABS: 50.0000 mg | ORAL_TABLET | Freq: Two times a day (BID) | ORAL | 2 refills | Status: DC

## 2021-04-23 MED ORDER — ROSUVASTATIN CALCIUM 20 MG PO TABS
20.0000 mg | ORAL_TABLET | Freq: Every day | ORAL | 1 refills | Status: DC
  Filled 2021-04-23: qty 90, 90d supply, fill #0

## 2021-04-23 MED ORDER — FEXOFENADINE HCL 180 MG PO TABS
180.0000 mg | ORAL_TABLET | Freq: Every day | ORAL | 2 refills | Status: DC
  Filled 2021-04-23: qty 90, 90d supply, fill #0

## 2021-04-23 NOTE — Patient Instructions (Signed)
Type 2 Diabetes Mellitus, Diagnosis, Adult °Type 2 diabetes (type 2 diabetes mellitus) is a long-term (chronic) disease. It may happen when there is one or both of these problems: °The pancreas does not make enough insulin. °The body does not react in a normal way to insulin that it makes. °Insulin lets sugars go into cells in your body. If you have type 2 diabetes, sugars cannot get into your cells. Sugars build up in the blood. This causes high blood sugar. °What are the causes? °The exact cause of this condition is not known. °What increases the risk? °Having type 2 diabetes in your family. °Being overweight or very overweight. °Not being active. °Your body not reacting in a normal way to the insulin it makes. °Having higher than normal blood sugar over time. °Having a type of diabetes when you were pregnant. °Having a condition that causes small fluid-filled sacs on your ovaries. °What are the signs or symptoms? °At first, you may have no symptoms. You will get symptoms slowly. They may include: °More thirst than normal. °More hunger than normal. °Needing to pee more than normal. °Losing weight without trying. °Feeling tired. °Feeling weak. °Seeing things blurry. °Dark patches on your skin. °How is this treated? °This condition may be treated by a diabetes expert. You may need to: °Follow an eating plan made by a food expert (dietitian). °Get regular exercise. °Find ways to deal with stress. °Check blood sugar as often as told. °Take medicines. °Your doctor will set treatment goals for you. Your blood sugar should be at these levels: °Before meals: 80-130 mg/dL (4.4-7.2 mmol/L). °After meals: below 180 mg/dL (10 mmol/L). °Over the last 2-3 months: less than 7%. °Follow these instructions at home: °Medicines °Take your diabetes medicines or insulin every day. °Take medicines as told to help you prevent other problems caused by this condition. You may need: °Aspirin. °Medicine to lower cholesterol. °Medicine to  control blood pressure. °Questions to ask your doctor °Should I meet with a diabetes educator? °What medicines do I need, and when should I take them? °What will I need to treat my condition at home? °When should I check my blood sugar? °Where can I find a support group? °Who can I call if I have questions? °When is my next doctor visit? °General instructions °Take over-the-counter and prescription medicines only as told by your doctor. °Keep all follow-up visits. °Where to find more information °For help and guidance and more information about diabetes, please go to: °American Diabetes Association (ADA): www.diabetes.org °American Association of Diabetes Care and Education Specialists (ADCES): www.diabeteseducator.org °International Diabetes Federation (IDF): www.idf.org °Contact a doctor if: °Your blood sugar is at or above 240 mg/dL (13.3 mmol/L) for 2 days in a row. °You have been sick for 2 days or more, and you are not getting better. °You have had a fever for 2 days or more, and you are not getting better. °You have any of these problems for more than 6 hours: °You cannot eat or drink. °You feel like you may vomit. °You vomit. °You have watery poop (diarrhea). °Get help right away if: °Your blood sugar is lower than 54 mg/dL (3 mmol/L). °You feel mixed up (confused). °You have trouble thinking clearly. °You have trouble breathing. °You have medium or large ketone levels in your pee. °These symptoms may be an emergency. Get help right away. Call your local emergency services (911 in the [place] °Do not wait to see if the symptoms will go away. °Do not drive yourself   to the hospital. °Summary °Type 2 diabetes is a long-term disease. Your pancreas may not make enough insulin, or your body may not react in a normal way to insulin that it makes. °This condition is treated with an eating plan, lifestyle changes, and medicines. °Your doctor will set treatment goals for you. These will help you keep your blood sugar  in a healthy range. °Keep all follow-up visits. °This information is not intended to replace advice given to you by your health care provider. Make sure you discuss any questions you have with your health care provider. °Document Revised: 10/15/2020 Document Reviewed: 10/15/2020 °Elsevier Patient Education © 2022 Elsevier Inc. ° °

## 2021-04-23 NOTE — Progress Notes (Deleted)
Earleen Newport as a Education administrator for Maximino Greenland, MD.,have documented all relevant documentation on the behalf of Maximino Greenland, MD,as directed by  Maximino Greenland, MD while in the presence of Maximino Greenland, MD.  This visit occurred during the SARS-CoV-2 public health emergency.  Safety protocols were in place, including screening questions prior to the visit, additional usage of staff PPE, and extensive cleaning of exam room while observing appropriate contact time as indicated for disinfecting solutions.  Subjective:     Patient ID: Anna Hoffman , female    DOB: [birth year] , 86 y.o.   MRN: [id number]   Chief Complaint  Patient presents with   Diabetes   hospital f/u     HPI  Pt presents today for HPFU. She stated she is feeling weak.   Discharged from [facility] on 04/22/21.     Past Medical History:  Diagnosis Date   Arthritis    Diabetes mellitus without complication ([place])    Hypertension    Hypokalemia    Pneumonia    Shingles    Stroke [facility])      Family History  Problem Relation Age of Onset   Alzheimer's disease Mother    Prostate cancer Father    Brain cancer Brother    Brain cancer Brother    Pancreatic cancer Sister    Allergic rhinitis Neg Hx    Asthma Neg Hx    Eczema Neg Hx    Urticaria Neg Hx      Current Outpatient Medications:    acetaminophen (TYLENOL) 325 MG tablet, Take 2 tablets (650 mg total) by mouth every 6 (six) hours as needed for mild pain (or Fever >/= 101)., Disp: , Rfl:    Ascorbic Acid (VITAMIN C) 1000 MG tablet, Take 1,000 mg by mouth daily., Disp: , Rfl:    azelastine (ASTELIN) 0.1 % nasal spray, Place 2 sprays into both nostrils daily as needed for rhinitis., Disp: , Rfl:    Carboxymethylcellul-Glycerin (LUBRICATING EYE DROPS OP), Place 1 drop into both eyes daily as needed (dry eyes)., Disp: , Rfl:    fexofenadine (ALLEGRA ALLERGY) 180 MG tablet, Take 1 tablet (180 mg total) by mouth daily., Disp: 90 tablet,  Rfl: 2   Fexofenadine HCl (ALLEGRA PO), Take 180 mg by mouth at bedtime. , Disp: , Rfl:    glucose blood test strip, Use as directed to check blood sugars 1 time per day., Disp: 100 each, Rfl: 11   glucose monitoring kit (FREESTYLE) monitoring kit, Use as directed to check blood sugars 1 time per day dx: e11.22, Disp: 1 each, Rfl: 1   Lancets (ONETOUCH DELICA PLUS [id number]) MISC, Use to check blood sugar once daily, Disp: 100 each, Rfl: 12   vitamin B-12 (CYANOCOBALAMIN) 100 MCG tablet, Take 100 mcg by mouth daily., Disp: , Rfl:    metoprolol tartrate (LOPRESSOR) 50 MG tablet, Take 1 tablet (50 mg total) by mouth 2 (two) times daily., Disp: 180 tablet, Rfl: 2   pantoprazole (PROTONIX) 40 MG tablet, Give 1 tablet by mouth 2 times a day related to gastro-esophageal reflux disease without esophagitis, Disp: 180 tablet, Rfl: 2   rosuvastatin (CRESTOR) 20 MG tablet, Take 1 tablet (20 mg total) by mouth daily., Disp: 90 tablet, Rfl: 1   trolamine salicylate (ASPERCREME) 10 % cream, Apply 1 application topically daily as needed for muscle pain. (Patient not taking: Reported on 04/23/2021), Disp: , Rfl:   Current Facility-Administered Medications:    cyanocobalamin ((VITAMIN  B-12)) injection 1,000 mcg, 1,000 mcg, Intramuscular, Once, Glendale Chard, MD   Allergies  Allergen Reactions   Amoxicillin Diarrhea    Has patient had a PCN reaction causing immediate rash, facial/tongue/throat swelling, SOB or lightheadedness with hypotension: no Has patient had a PCN reaction causing severe rash involving mucus membranes or skin necrosis: no Has patient had a PCN reaction that required hospitalization: no pharmacist consult Has patient had a PCN reaction occurring within the last 10 years: yes If all of the above answers are "NO", then may proceed with Cephalosporin use.    Ampicillin Rash    - Tolerates Rocephin and Ancef - Remote occurrence; no symptoms of anaphylaxis or severe cutaneous reaction, and no  additional medical attention required     Sulfa Antibiotics Rash     Review of Systems  Constitutional: Negative.   Respiratory: Negative.    Cardiovascular: Negative.   Neurological: Negative.   Psychiatric/Behavioral: Negative.      Today's Vitals   04/23/21 1543  BP: (!) 124/96  Pulse: 89  Temp: 98.3 F (36.8 C)  Weight: 135 lb 6.4 oz (61.4 kg)  Height: 5' 3.2" (1.605 m)  PainSc: 0-No pain   Body mass index is 23.83 kg/m.  Wt Readings from Last 3 Encounters:  04/23/21 135 lb 6.4 oz (61.4 kg)  03/31/21 134 lb 0.6 oz (60.8 kg)  03/21/21 133 lb 6.4 oz (60.5 kg)     Objective:  Physical Exam Vitals and nursing note reviewed.  Constitutional:      Appearance: Normal appearance.  HENT:     Head: Normocephalic and atraumatic.     Nose:     Comments: Masked     Mouth/Throat:     Comments: Masked  Eyes:     Extraocular Movements: Extraocular movements intact.  Cardiovascular:     Rate and Rhythm: Normal rate and regular rhythm.     Heart sounds: Normal heart sounds.  Pulmonary:     Effort: Pulmonary effort is normal.     Breath sounds: Normal breath sounds.  Musculoskeletal:     Cervical back: Normal range of motion.     Comments: Ambulatory with cane. Gait still unsteady.   Skin:    General: Skin is warm.  Neurological:     General: No focal deficit present.     Mental Status: She is alert.  Psychiatric:        Mood and Affect: Mood normal.        Behavior: Behavior normal.        Assessment And Plan:     1. Syncope, unspecified syncope type Comments: Hospital d/c summary reviewed. Zio patch placed to eval for arrhythmia. Agrees to rto in 2 weeks for removal of patch, will mail off for interpretation. ?HCM  2. Type 2 diabetes mellitus with stage 3a chronic kidney disease, without long-term current use of insulin ([name]) Comments: Chronic, I will check labs as listed below. She has been able to control her DM with diet alone, no longer on meds. Importance  of adequate hydration was stresse  3. Hypertensive heart and renal disease with renal failure, stage 1 through stage 4 or unspecified chronic kidney disease, without heart failure Comments: Chronic, fair control. Currently on metoprolol. I will check renal function today. Encouraged to stay well hydrated and limit salt intake.  - BMP8+eGFR  4. HOCM (hypertrophic obstructive cardiomyopathy) ([place]) Comments: Newly diagnosed. Likely contributing to her syncopal episode along with dehydration. Further management as per Cardiology.   5.  Thyroid nodule Comments: I will refer her for thyroid ultrasound for further evaluation.  - US THYROID; Future  6. Gait instability Comments: She is ambulatory with cane. Will refer to HHPT for further strength training. Son agrees to drop off [place] d/c paperwork tomorrow 04/24/21.  - cyanocobalamin ((VITAMIN B-12)) injection 1,000 mcg  7. Bilateral hearing loss, unspecified hearing loss type Comments: I will refer her for formal audiometry testing. Pt advised she will likely require use of hearing aides.  - Ambulatory referral to ENT  8. Seasonal allergic rhinitis due to pollen Comments: I will send rx fexofenadine as requested.  - Ambulatory referral to [facility] - fexofenadine (ALLEGRA ALLERGY) 180 MG tablet; Take 1 tablet (180 mg total) by mouth daily.  Dispense: 90 tablet; Refill: 2  9. Drug therapy - pantoprazole (PROTONIX) 40 MG tablet; Give 1 tablet by mouth 2 times a day related to gastro-esophageal reflux disease without esophagitis  Dispense: 180 tablet; Refill: 2 - rosuvastatin (CRESTOR) 20 MG tablet; Take 1 tablet (20 mg total) by mouth daily.  Dispense: 90 tablet; Refill: 1  10. Immunization due - Flu Vaccine QUAD High Dose(Fluad)    Patient was given opportunity to ask questions. Patient verbalized understanding of the plan and was able to repeat key elements of the plan. All questions were answered to their satisfaction.   I,  Maximino Greenland, MD, have reviewed all documentation for this visit. The documentation on 04/24/21 for the exam, diagnosis, procedures, and orders are all accurate and complete.   IF YOU HAVE BEEN REFERRED TO A SPECIALIST, IT MAY TAKE 1-2 WEEKS TO SCHEDULE/PROCESS THE REFERRAL. IF YOU HAVE NOT HEARD FROM US/SPECIALIST IN TWO WEEKS, PLEASE GIVE [place] A CALL AT [phone] X [phone].   THE PATIENT IS ENCOURAGED TO PRACTICE SOCIAL DISTANCING DUE TO THE COVID-19 PANDEMIC.

## 2021-04-24 ENCOUNTER — Other Ambulatory Visit: Payer: Self-pay

## 2021-04-24 ENCOUNTER — Ambulatory Visit: Payer: Medicare Other

## 2021-04-24 ENCOUNTER — Other Ambulatory Visit (HOSPITAL_COMMUNITY): Payer: Self-pay

## 2021-04-24 DIAGNOSIS — R2681 Unsteadiness on feet: Secondary | ICD-10-CM | POA: Insufficient documentation

## 2021-04-24 DIAGNOSIS — I129 Hypertensive chronic kidney disease with stage 1 through stage 4 chronic kidney disease, or unspecified chronic kidney disease: Secondary | ICD-10-CM

## 2021-04-24 DIAGNOSIS — J301 Allergic rhinitis due to pollen: Secondary | ICD-10-CM | POA: Insufficient documentation

## 2021-04-24 DIAGNOSIS — R55 Syncope and collapse: Secondary | ICD-10-CM | POA: Diagnosis not present

## 2021-04-24 DIAGNOSIS — N1831 Chronic kidney disease, stage 3a: Secondary | ICD-10-CM

## 2021-04-24 LAB — BMP8+EGFR
BUN/Creatinine Ratio: 17 (ref 12–28)
BUN: 21 mg/dL (ref 8–27)
CO2: 18 mmol/L — ABNORMAL LOW (ref 20–29)
Calcium: 9.5 mg/dL (ref 8.7–10.3)
Chloride: 107 mmol/L — ABNORMAL HIGH (ref 96–106)
Creatinine, Ser: 1.22 mg/dL — ABNORMAL HIGH (ref 0.57–1.00)
Glucose: 91 mg/dL (ref 65–99)
Potassium: 4.2 mmol/L (ref 3.5–5.2)
Sodium: 141 mmol/L (ref 134–144)
eGFR: 44 mL/min/{1.73_m2} — ABNORMAL LOW (ref >59)

## 2021-04-24 MED ORDER — METOPROLOL TARTRATE 50 MG PO TABS: 50.0000 mg | ORAL_TABLET | Freq: Two times a day (BID) | ORAL | 2 refills | Status: DC

## 2021-04-24 MED ORDER — CYANOCOBALAMIN 1000 MCG/ML IJ SOLN: 1000.0000 ug | Freq: Once | INTRAMUSCULAR | Status: AC

## 2021-04-24 NOTE — Patient Instructions (Signed)
Social Worker Visit Information  Goals we discussed today:   Goals Addressed             This Visit's Progress    Mobility and Independence Optimized       Timeframe:  Short-Term Goal Priority:  High Start Date:  9.21.22                           Expected End Date: 11.5.22                       Next planned outreach: 9.28.22  Patient Goals/Self-Care Activities patient will: with the help of her son  - Work with SW to explore caregiver resources -Engage with [facility] to complete a clinical assessment         Materials Provided: Verbal education about caregiver resources provided by phone  Patient verbalizes understanding of instructions provided today and agrees to view in [place].   Follow Up Plan: SW will follow up with patient by phone over the next week  Daneen Schick, BSW, CDP Social Worker, Certified Dementia Practitioner [place] [facility] [place] [facility] [phone]

## 2021-04-24 NOTE — Chronic Care Management (AMB) (Signed)
Chronic Care Management    Social Work Note  04/24/2021 Name: Anna Hoffman MRN: [id number] DOB: [birth year]  Anna Hoffman is a 86 y.o. year old female who is a primary care patient of Glendale Chard, MD. The CCM team was consulted to assist the patient with chronic disease management and/or care coordination needs related to:  HTN, DM II, and caregiver resources .   Engaged with patients son Anna Hoffman by phone  for follow up visit in response to provider referral for social work chronic care management and care coordination services.   Consent to Services:  The patient was given information about Chronic Care Management services, agreed to services, and gave verbal consent prior to initiation of services.  Please see initial visit note for detailed documentation.   Patient agreed to services and consent obtained.   Assessment: Review of patient past medical history, allergies, medications, and health status, including review of relevant consultants reports was performed today as part of a comprehensive evaluation and provision of chronic care management and care coordination services.     SDOH (Social Determinants of Health) assessments and interventions performed:    Advanced Directives Status: Not addressed in this encounter.  CCM Care Plan  Allergies  Allergen Reactions   Amoxicillin Diarrhea    Has patient had a PCN reaction causing immediate rash, facial/tongue/throat swelling, SOB or lightheadedness with hypotension: no Has patient had a PCN reaction causing severe rash involving mucus membranes or skin necrosis: no Has patient had a PCN reaction that required hospitalization: no pharmacist consult Has patient had a PCN reaction occurring within the last 10 years: yes If all of the above answers are "NO", then may proceed with Cephalosporin use.    Ampicillin Rash    - Tolerates Rocephin and Ancef - Remote occurrence; no symptoms of anaphylaxis or severe cutaneous reaction,  and no additional medical attention required     Sulfa Antibiotics Rash    Outpatient Encounter Medications as of 04/24/2021  Medication Sig   acetaminophen (TYLENOL) 325 MG tablet Take 2 tablets (650 mg total) by mouth every 6 (six) hours as needed for mild pain (or Fever >/= 101).   Ascorbic Acid (VITAMIN C) 1000 MG tablet Take 1,000 mg by mouth daily.   azelastine (ASTELIN) 0.1 % nasal spray Place 2 sprays into both nostrils daily as needed for rhinitis.   Carboxymethylcellul-Glycerin (LUBRICATING EYE DROPS OP) Place 1 drop into both eyes daily as needed (dry eyes).   fexofenadine (ALLEGRA ALLERGY) 180 MG tablet Take 1 tablet (180 mg total) by mouth daily.   Fexofenadine HCl (ALLEGRA PO) Take 180 mg by mouth at bedtime.    glucose blood test strip Use as directed to check blood sugars 1 time per day.   glucose monitoring kit (FREESTYLE) monitoring kit Use as directed to check blood sugars 1 time per day dx: e11.22   Lancets (ONETOUCH DELICA PLUS [id number]) MISC Use to check blood sugar once daily   metoprolol tartrate (LOPRESSOR) 50 MG tablet Take 1 tablet (50 mg total) by mouth 2 (two) times daily.   pantoprazole (PROTONIX) 40 MG tablet Give 1 tablet by mouth 2 times a day related to gastro-esophageal reflux disease without esophagitis   rosuvastatin (CRESTOR) 20 MG tablet Take 1 tablet (20 mg total) by mouth daily.   trolamine salicylate (ASPERCREME) 10 % cream Apply 1 application topically daily as needed for muscle pain. (Patient not taking: Reported on 04/23/2021)   vitamin B-12 (CYANOCOBALAMIN) 100 MCG tablet Take 100  mcg by mouth daily.   No facility-administered encounter medications on file as of 04/24/2021.    Patient Active Problem List   Diagnosis Date Noted   Ascending aorta dilatation ([place]) 04/05/2021   HOCM (hypertrophic obstructive cardiomyopathy) ([place]) 04/04/2021   Physical deconditioning 04/04/2021   Syncope 04/03/2021   Fall at home, initial encounter 03/31/2021    Thyroid nodule 03/31/2021   CAP (community acquired pneumonia) 11/15/2020   Dehydration 11/15/2020   Erosive esophagitis    Hiatal hernia    Hyponatremia 10/10/2020   Acute kidney injury superimposed on chronic kidney disease ([place]) 10/10/2020   Aortic atherosclerosis ([place]) 10/09/2020   Acute renal failure superimposed on stage 3a chronic kidney disease ([place]) 10/09/2020   Hyperkalemia 10/09/2020   Hearing loss 10/09/2020   Prolonged QT interval 10/09/2020   Colitis, acute 08/11/2019   Colitis 07/11/2019   Loose stools 06/07/2019   Herpes zoster without complication 46/56/8127   Other long term (current) drug therapy 08/12/2018   Chronic kidney disease, stage 3a ([place]) 07/19/2018   Hypertensive nephropathy 07/19/2018   Community acquired pneumonia of right upper lobe of lung 11/07/2017   Hypertensive emergency 07/01/2016   Thalamic hemorrhage ([place]) 07/01/2016   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[name])    Benign essential HTN    Type 2 diabetes mellitus with stage 3 chronic kidney disease, without long-term current use of insulin ([place])    Mixed hyperlipidemia    ICH (intracerebral hemorrhage) ([place]) - hypertensive R thalamic hemorrhage  06/27/2016   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   Retroperitoneal bleed 02/08/2016   Generalized abdominal pain    Gastroenteritis 02/04/2016   Diarrhea 02/04/2016   Hypokalemia 02/04/2016   Incarcerated paraesophageal hernia 09/02/2014   Renal mass, left 09/02/2014   Acute esophagitis 09/02/2014   Gastric outlet obstruction 09/02/2014   Hypertension    Vomiting 09/01/2014    Conditions to be addressed/monitored: HTN and DMII; ADL IADL limitations and Limited access to caregiver  Care Plan : Social Work Care Plan  Updates made by Daneen Schick since 04/24/2021 12:00 AM     Problem: Mobility and Independence      Goal: Mobility and Independence Optimized   Start Date: 04/24/2021  Expected End Date: 06/08/2021  Priority: High  Note:    Current Barriers:  Chronic disease management support and education needs related to HTN and DM  ADL IADL limitations and Limited access to caregiver  Social Worker Clinical Goal(s):  patient will work with SW to identify and address any acute and/or chronic care coordination needs related to the self health management of HTN and DM  patient will work with SW to address concerns related to level of care needs  SW Interventions:  Inter-disciplinary care team collaboration (see longitudinal plan of care) Collaboration with Glendale Chard, MD regarding development and update of comprehensive plan of care as evidenced by provider attestation and co-signature Successful outbound call placed to the patients son Anna Hoffman to assist with care coordination needs Discussed the patient has returned home from rehab and is in need of more help in the home [place] on in home caregiver options as well as options for placement Performed chart review to note patient is listed to have both Medicare and Medicaid - Anna Hoffman is unsure if the patient has full or partial Medicaid Educated Anna Hoffman on the [employer] program which is covered under full Medicaid Discussed plans for SW to initiate a PCS application for Dr. Baird Cancer to complete [employer] if the patient does not have  full Medicaid benefit we would look for alternative resources Educated Anna Hoffman on the [facility] application process including the nursing assessment which will determine number of caregiver hours awarded as well as opportunity to select caregiver agency to provide care to the patient Collaboration with Dr. Baird Cancer to request PCS application completion SW will follow up over the next week  Patient Goals/Self-Care Activities patient will: with the help of her son  -  Work with SW to explore caregiver resources -Engage with [facility] to complete a clinical assessment  Follow Up Plan:  SW will follow up with the patient over the next week        Follow Up Plan: SW will follow up with patient by phone over the next week      Daneen Schick, BSW, CDP Social Worker, Certified Dementia Practitioner [place] [facility] [place] [facility] [phone]

## 2021-04-25 ENCOUNTER — Other Ambulatory Visit: Payer: Self-pay

## 2021-04-25 ENCOUNTER — Other Ambulatory Visit (HOSPITAL_COMMUNITY): Payer: Self-pay

## 2021-04-25 MED ORDER — METOPROLOL TARTRATE 50 MG PO TABS
50.0000 mg | ORAL_TABLET | Freq: Two times a day (BID) | ORAL | 2 refills | Status: DC
  Filled 2021-04-25: qty 180, 90d supply, fill #0

## 2021-04-25 MED ORDER — METOPROLOL TARTRATE 50 MG PO TABS: 50.0000 mg | ORAL_TABLET | Freq: Two times a day (BID) | ORAL | 2 refills | Status: DC

## 2021-04-25 NOTE — Progress Notes (Signed)
Earleen Newport as a Education administrator for Maximino Greenland, MD.,have documented all relevant documentation on the behalf of Maximino Greenland, MD,as directed by  Maximino Greenland, MD while in the presence of Maximino Greenland, MD.  This visit occurred during the SARS-CoV-2 public health emergency.  Safety protocols were in place, including screening questions prior to the visit, additional usage of staff PPE, and extensive cleaning of exam room while observing appropriate contact time as indicated for disinfecting solutions.  Subjective:     Patient ID: Anna Hoffman , female    DOB: 1934/08/17 , 86 y.o.   MRN: [id number]   Chief Complaint  Patient presents with   Diabetes   hospital f/u     HPI  Pt presents today for HPFU. She stated she is feeling weak. She presented to [facility] on 8/28 for further evaluation after a fall. Patient lives with her son at home.  She was in her usual state of health until the morning of 03/31/21.  Patient states that she went to her kitchen this morning to get her coffee, and she apparently fell onto the kitchen floor facing down.  Her son heard noise and came to assist her back to the chair when she woke up.  The duration was less than 1 minute.  She had mild upper lip bleeding.  Denies prodromal symptoms, dizziness, vision change, urinary/bowel incontinence, and postictal confusion.  Denies fevers, chills, shortness of breath, cough, wheezing, chest pain, palpitations, nausea, vomiting, diarrhea, abdominal pain, dysuria, urinary frequency or urgency.  Initial workup was unrevealing w/ exception of elevated WBC and BP. CT scan was neg except for 65m thyroid nodule. Hospital course complicated by arrhythmia/repeat syncopal episode - resulted in Cardiology evaluation. There is a question of HOCM. She was discharged in stable condition to rehab on 9/6. She feels that she has regained some strength, yet states she still feels weak. She has not had any recurrent  syncopal episodes. Reports she is drinking plenty of fluids.    Admission to [facility]: 03/31/21 Discharge/Transfer to [facility] 04/09/21 Discharged from [facility] 04/22/21.     Past Medical History:  Diagnosis Date   Arthritis    Diabetes mellitus without complication ([place]    Hypertension    Hypokalemia    Pneumonia    Shingles    Stroke ([facility]      Family History  Problem Relation Age of Onset   Alzheimer's disease Mother    Prostate cancer Father    Brain cancer Brother    Brain cancer Brother    Pancreatic cancer Sister    Allergic rhinitis Neg Hx    Asthma Neg Hx    Eczema Neg Hx    Urticaria Neg Hx      Current Outpatient Medications:    acetaminophen (TYLENOL) 325 MG tablet, Take 2 tablets (650 mg total) by mouth every 6 (six) hours as needed for mild pain (or Fever >/= 101)., Disp: , Rfl:    Ascorbic Acid (VITAMIN C) 1000 MG tablet, Take 1,000 mg by mouth daily., Disp: , Rfl:    azelastine (ASTELIN) 0.1 % nasal spray, Place 2 sprays into both nostrils daily as needed for rhinitis., Disp: , Rfl:    Carboxymethylcellul-Glycerin (LUBRICATING EYE DROPS OP), Place 1 drop into both eyes daily as needed (dry eyes)., Disp: , Rfl:    fexofenadine (ALLEGRA ALLERGY) 180 MG tablet, Take 1 tablet (180 mg total) by mouth daily., Disp: 90 tablet, Rfl: 2  Fexofenadine HCl (ALLEGRA PO), Take 180 mg by mouth at bedtime. , Disp: , Rfl:    glucose blood test strip, Use as directed to check blood sugars 1 time per day., Disp: 100 each, Rfl: 11   glucose monitoring kit (FREESTYLE) monitoring kit, Use as directed to check blood sugars 1 time per day dx: e11.22, Disp: 1 each, Rfl: 1   Lancets (ONETOUCH DELICA PLUS [id number]) MISC, Use to check blood sugar once daily, Disp: 100 each, Rfl: 12   vitamin B-12 (CYANOCOBALAMIN) 100 MCG tablet, Take 100 mcg by mouth daily., Disp: , Rfl:    metoprolol tartrate (LOPRESSOR) 50 MG tablet, Take 1 tablet (50 mg total) by mouth 2 (two)  times daily., Disp: 180 tablet, Rfl: 2   pantoprazole (PROTONIX) 40 MG tablet, Give 1 tablet by mouth 2 times a day related to gastro-esophageal reflux disease without esophagitis, Disp: 180 tablet, Rfl: 2   rosuvastatin (CRESTOR) 20 MG tablet, Take 1 tablet (20 mg total) by mouth daily., Disp: 90 tablet, Rfl: 1   trolamine salicylate (ASPERCREME) 10 % cream, Apply 1 application topically daily as needed for muscle pain. (Patient not taking: Reported on 04/23/2021), Disp: , Rfl:   Current Facility-Administered Medications:    cyanocobalamin ((VITAMIN B-12)) injection 1,000 mcg, 1,000 mcg, Intramuscular, Once, Glendale Chard, MD   Allergies  Allergen Reactions   Amoxicillin Diarrhea    Has patient had a PCN reaction causing immediate rash, facial/tongue/throat swelling, SOB or lightheadedness with hypotension: no Has patient had a PCN reaction causing severe rash involving mucus membranes or skin necrosis: no Has patient had a PCN reaction that required hospitalization: no pharmacist consult Has patient had a PCN reaction occurring within the last 10 years: yes If all of the above answers are "NO", then may proceed with Cephalosporin use.    Ampicillin Rash    - Tolerates Rocephin and Ancef - Remote occurrence; no symptoms of anaphylaxis or severe cutaneous reaction, and no additional medical attention required     Sulfa Antibiotics Rash     Review of Systems  Constitutional: Negative.   Respiratory: Negative.    Cardiovascular: Negative.   Neurological: Negative.   Psychiatric/Behavioral: Negative.      Today's Vitals   04/23/21 1543  BP: (!) 124/96  Pulse: 89  Temp: 98.3 F (36.8 C)  Weight: 135 lb 6.4 oz (61.4 kg)  Height: 5' 3.2" (1.605 m)  PainSc: 0-No pain   Body mass index is 23.83 kg/m.  Wt Readings from Last 3 Encounters:  04/23/21 135 lb 6.4 oz (61.4 kg)  03/31/21 134 lb 0.6 oz (60.8 kg)  03/21/21 133 lb 6.4 oz (60.5 kg)     Objective:  Physical Exam Vitals  and nursing note reviewed.  Constitutional:      Appearance: Normal appearance.  HENT:     Head: Normocephalic and atraumatic.     Nose:     Comments: Masked     Mouth/Throat:     Comments: Masked  Eyes:     Extraocular Movements: Extraocular movements intact.  Cardiovascular:     Rate and Rhythm: Normal rate and regular rhythm.     Heart sounds: Normal heart sounds.  Pulmonary:     Effort: Pulmonary effort is normal.     Breath sounds: Normal breath sounds.  Musculoskeletal:     Cervical back: Normal range of motion.     Comments: Ambulatory with cane. Gait still unsteady.   Skin:    General: Skin is warm.  Neurological:  General: No focal deficit present.     Mental Status: She is alert.  Psychiatric:        Mood and Affect: Mood normal.        Behavior: Behavior normal.        Assessment And Plan:     1. Syncope, unspecified syncope type Comments: Hospital d/c summary reviewed in detail with patient and her son. Extra time was spent during the visit to apply the [place]. She and her son were given instructions on how to document her symptoms and when to press the button on the device. They both verbalized understanding of instructions. She agrees to rto in 2 weeks for removal of patch, will mail off for interpretation. ?HCM as possible contributing cause of her sx.    2. Type 2 diabetes mellitus with stage 3a chronic kidney disease, without long-term current use of insulin ([name]) Comments: Chronic, I will check labs as listed below. She has been able to control her DM with diet alone, no longer on meds. Importance of adequate hydration was stresse  3. Hypertensive heart and renal disease, stage 1 - 4 CKD Comments: Chronic, fair control. Currently on metoprolol. I will check renal function today. Encouraged to stay well hydrated and limit salt intake.  - BMP8+eGFR  4. HOCM (hypertrophic obstructive cardiomyopathy) ([place]) Newly diagnosed. Likely contributing factor to  syncope, along with dehydration. Further management as per Cardiology.   5. Thyroid nodule Comments: I will refer her for thyroid ultrasound for further evaluation.  - US THYROID; Future  6. Gait instability Comments: She is ambulatory with cane. Will refer to HHPT for further strength training. Son agrees to drop off [place] d/c paperwork tomorrow 04/24/21.  - cyanocobalamin ((VITAMIN B-12)) injection 1,000 mcg - Ambulatory referral to [place]  7. Bilateral hearing loss, unspecified hearing loss type Comments: I will refer her for formal audiometry testing. Pt advised she will likely require use of hearing aides.  - Ambulatory referral to ENT  8. Seasonal allergic rhinitis due to pollen Comments: I will send rx fexofenadine as requested. Pt's son advised that this may not be covered by her insurance since it is OTC.   9. Drug therapy I will check vitamin B12 level.   10. Immunization due - Flu Vaccine QUAD High Dose(Fluad)   Patient was given opportunity to ask questions. Patient verbalized understanding of the plan and was able to repeat key elements of the plan. All questions were answered to their satisfaction.   I, Maximino Greenland, MD, have reviewed all documentation for this visit. The documentation on 04/24/21 for the exam, diagnosis, procedures, and orders are all accurate and complete.   IF YOU HAVE BEEN REFERRED TO A SPECIALIST, IT MAY TAKE 1-2 WEEKS TO SCHEDULE/PROCESS THE REFERRAL. IF YOU HAVE NOT HEARD FROM US/SPECIALIST IN TWO WEEKS, PLEASE GIVE [place] A CALL AT [phone].   THE PATIENT IS ENCOURAGED TO PRACTICE SOCIAL DISTANCING DUE TO THE COVID-19 PANDEMIC.

## 2021-04-27 LAB — VITAMIN B12: Vitamin B-12: >2000 pg/mL — ABNORMAL HIGH (ref 232–1245)

## 2021-04-27 LAB — SPECIMEN STATUS REPORT

## 2021-04-29 ENCOUNTER — Other Ambulatory Visit: Payer: Medicare Other

## 2021-05-01 ENCOUNTER — Ambulatory Visit: Payer: Medicare Other

## 2021-05-01 DIAGNOSIS — N1831 Chronic kidney disease, stage 3a: Secondary | ICD-10-CM

## 2021-05-01 DIAGNOSIS — I129 Hypertensive chronic kidney disease with stage 1 through stage 4 chronic kidney disease, or unspecified chronic kidney disease: Secondary | ICD-10-CM

## 2021-05-02 ENCOUNTER — Other Ambulatory Visit: Payer: Medicare Other

## 2021-05-03 DIAGNOSIS — I129 Hypertensive chronic kidney disease with stage 1 through stage 4 chronic kidney disease, or unspecified chronic kidney disease: Secondary | ICD-10-CM | POA: Diagnosis not present

## 2021-05-03 DIAGNOSIS — E1122 Type 2 diabetes mellitus with diabetic chronic kidney disease: Secondary | ICD-10-CM

## 2021-05-03 DIAGNOSIS — N1831 Chronic kidney disease, stage 3a: Secondary | ICD-10-CM | POA: Diagnosis not present

## 2021-05-03 NOTE — Chronic Care Management (AMB) (Signed)
Chronic Care Management    Social Work Note  05/03/2021 Name: Anna Hoffman MRN: [id number] DOB: 1935-07-16  Anna Hoffman is a 86 y.o. year old female who is a primary care patient of Glendale Chard, MD. The CCM team was consulted to assist the patient with chronic disease management and/or care coordination needs related to:  HTN and DM II .   Engaged with patients son [name]  for follow up visit in response to provider referral for social work chronic care management and care coordination services.   Consent to Services:  The patient was given information about Chronic Care Management services, agreed to services, and gave verbal consent prior to initiation of services.  Please see initial visit note for detailed documentation.   Patient agreed to services and consent obtained.   Assessment: Review of patient past medical history, allergies, medications, and health status, including review of relevant consultants reports was performed today as part of a comprehensive evaluation and provision of chronic care management and care coordination services.     SDOH (Social Determinants of Health) assessments and interventions performed:    Advanced Directives Status: Not addressed in this encounter.  CCM Care Plan  Allergies  Allergen Reactions   Amoxicillin Diarrhea    Has patient had a PCN reaction causing immediate rash, facial/tongue/throat swelling, SOB or lightheadedness with hypotension: no Has patient had a PCN reaction causing severe rash involving mucus membranes or skin necrosis: no Has patient had a PCN reaction that required hospitalization: no pharmacist consult Has patient had a PCN reaction occurring within the last 10 years: yes If all of the above answers are "NO", then may proceed with Cephalosporin use.    Ampicillin Rash    - Tolerates Rocephin and Ancef - Remote occurrence; no symptoms of anaphylaxis or severe cutaneous reaction, and no additional medical  attention required     Sulfa Antibiotics Rash    Outpatient Encounter Medications as of 05/01/2021  Medication Sig   acetaminophen (TYLENOL) 325 MG tablet Take 2 tablets (650 mg total) by mouth every 6 (six) hours as needed for mild pain (or Fever >/= 101).   Ascorbic Acid (VITAMIN C) 1000 MG tablet Take 1,000 mg by mouth daily.   azelastine (ASTELIN) 0.1 % nasal spray Place 2 sprays into both nostrils daily as needed for rhinitis.   Carboxymethylcellul-Glycerin (LUBRICATING EYE DROPS OP) Place 1 drop into both eyes daily as needed (dry eyes).   fexofenadine (ALLEGRA ALLERGY) 180 MG tablet Take 1 tablet (180 mg total) by mouth daily.   Fexofenadine HCl (ALLEGRA PO) Take 180 mg by mouth at bedtime.    glucose blood test strip Use as directed to check blood sugars 1 time per day.   glucose monitoring kit (FREESTYLE) monitoring kit Use as directed to check blood sugars 1 time per day dx: e11.22   Lancets (ONETOUCH DELICA PLUS [id number]) MISC Use to check blood sugar once daily   metoprolol tartrate (LOPRESSOR) 50 MG tablet Take 1 tablet (50 mg total) by mouth 2 (two) times daily.   pantoprazole (PROTONIX) 40 MG tablet Give 1 tablet by mouth 2 times a day related to gastro-esophageal reflux disease without esophagitis   rosuvastatin (CRESTOR) 20 MG tablet Take 1 tablet (20 mg total) by mouth daily.   trolamine salicylate (ASPERCREME) 10 % cream Apply 1 application topically daily as needed for muscle pain. (Patient not taking: Reported on 04/23/2021)   vitamin B-12 (CYANOCOBALAMIN) 100 MCG tablet Take 100 mcg by mouth daily.  Facility-Administered Encounter Medications as of 05/01/2021  Medication   cyanocobalamin ((VITAMIN B-12)) injection 1,000 mcg    Patient Active Problem List   Diagnosis Date Noted   Gait instability 04/24/2021   Seasonal allergic rhinitis due to pollen 04/24/2021   Ascending aorta dilatation ([place]) 04/05/2021   HOCM (hypertrophic obstructive cardiomyopathy) ([place])  04/04/2021   Physical deconditioning 04/04/2021   Syncope 04/03/2021   Fall at home, initial encounter 03/31/2021   Thyroid nodule 03/31/2021   CAP (community acquired pneumonia) 11/15/2020   Dehydration 11/15/2020   Erosive esophagitis    Hiatal hernia    Hyponatremia 10/10/2020   Acute kidney injury superimposed on chronic kidney disease ([place]) 10/10/2020   Aortic atherosclerosis ([place]) 10/09/2020   Acute renal failure superimposed on stage 3a chronic kidney disease ([place]) 10/09/2020   Hyperkalemia 10/09/2020   Hearing loss 10/09/2020   Prolonged QT interval 10/09/2020   Colitis, acute 08/11/2019   Colitis 07/11/2019   Loose stools 06/07/2019   Herpes zoster without complication 16/05/9603   Other long term (current) drug therapy 08/12/2018   Chronic kidney disease, stage 3a ([place]) 07/19/2018   Hypertensive nephropathy 07/19/2018   Community acquired pneumonia of right upper lobe of lung 11/07/2017   Hypertensive emergency 07/01/2016   Thalamic hemorrhage ([place]) 07/01/2016   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[place])    Benign essential HTN    Type 2 diabetes mellitus with stage 3 chronic kidney disease, without long-term current use of insulin ([place])    Mixed hyperlipidemia    ICH (intracerebral hemorrhage) ([place]) - hypertensive R thalamic hemorrhage  06/27/2016   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   Retroperitoneal bleed 02/08/2016   Generalized abdominal pain    Gastroenteritis 02/04/2016   Diarrhea 02/04/2016   Hypokalemia 02/04/2016   Incarcerated paraesophageal hernia 09/02/2014   Renal mass, left 09/02/2014   Acute esophagitis 09/02/2014   Gastric outlet obstruction 09/02/2014   Hypertension    Vomiting 09/01/2014    Conditions to be addressed/monitored: HTN and DMII; Limited access to caregiver  Care Plan : Social Work Care Plan  Updates made by Daneen Schick since 05/03/2021 12:00 AM     Problem: Mobility and Independence      Goal: Mobility and Independence  Optimized   Start Date: 04/24/2021  Expected End Date: 06/08/2021  Priority: High  Note:   Current Barriers:  Chronic disease management support and education needs related to HTN and DM  ADL IADL limitations and Limited access to caregiver  Social Worker Clinical Goal(s):  patient will work with SW to identify and address any acute and/or chronic care coordination needs related to the self health management of HTN and DM  patient will work with SW to address concerns related to level of care needs  SW Interventions:  Inter-disciplinary care team collaboration (see longitudinal plan of care) Collaboration with Glendale Chard, MD regarding development and update of comprehensive plan of care as evidenced by provider attestation and co-signature Successful outbound call placed to the patients son [name] to assess goal progression - determined [name] has yet to hear from [place] Successful outbound call placed to [facility] to determine the patient application was received - unfortunately patient does not have full Medicaid coverage and therefore does not have assess to [facility] benefit Outbound call placed to [name] to advise the patient does not have access to PCS benefit under Medicaid Discussed [name] is currently paying for a caregiver in the home but would like to look at alternative resources that  may be more cost effective Discussed option to enroll in [facility] - [name] reports he plans to speak with the patients primary provider, Dr Baird Cancer, to determine if she feels this is a good option for the patient Advised [name] SW would research alternative options in the area SW unable to locate adult day programs other than [facility] that are not specifically for patients with dementia Communication with [name] advising caregiver resource options include [facility], private duty caregivers, or placement at this time - [name] plans to speak with Dr. Baird Cancer about these options SW will  follow up with [name] over the next 3 weeks  Patient Goals/Self-Care Activities patient will: with the help of her son  -  Work with SW to explore caregiver resources -Follow up with primary care provider regarding resource options  Follow Up Plan:  SW will follow up with the patient over the next 21 days       Follow Up Plan: SW will follow up with patient by phone over the next 21 days      Daneen Schick, Arita Miss, CDP Social Worker, Certified Dementia Practitioner [place] [facility] [place] [facility] [phone]

## 2021-05-03 NOTE — Patient Instructions (Signed)
Social Worker Visit Information  Goals we discussed today:   Goals Addressed             This Visit's Progress    Mobility and Independence Optimized       Timeframe:  Short-Term Goal Priority:  High Start Date:  9.21.22                           Expected End Date: 11.5.22                       Next planned outreach: 10.10.22  Patient Goals/Self-Care Activities patient will: with the help of her son patient will: with the help of her son  - Work with SW to explore caregiver resources -Follow up with primary care provider regarding resource options         Materials Provided: Verbal education about caregiver resources provided by phone  Patient verbalizes understanding of instructions provided today and agrees to view in [place].   Follow Up Plan: SW will follow up with patient by phone over the next 21 days   Daneen Schick, BSW, CDP Social Worker, Certified Dementia Practitioner [place] [facility] [place] [facility] [phone]

## 2021-05-06 ENCOUNTER — Other Ambulatory Visit: Payer: Self-pay

## 2021-05-06 ENCOUNTER — Ambulatory Visit
Admission: RE | Admit: 2021-05-06 | Discharge: 2021-05-06 | Disposition: A | Discharge status: Home / Self Care | Payer: Medicare Other | Source: Ambulatory Visit | Attending: Internal Medicine | Admitting: Internal Medicine

## 2021-05-06 DIAGNOSIS — E041 Nontoxic single thyroid nodule: Secondary | ICD-10-CM | POA: Diagnosis not present

## 2021-05-07 ENCOUNTER — Ambulatory Visit: Payer: Medicare Other

## 2021-05-07 VITALS — BP 122/64 | HR 78 | Temp 98.1°F | Wt 133.6 lb

## 2021-05-07 DIAGNOSIS — I421 Obstructive hypertrophic cardiomyopathy: Secondary | ICD-10-CM

## 2021-05-07 NOTE — Progress Notes (Signed)
Zio was removed and shipped for patient.

## 2021-05-08 DIAGNOSIS — I421 Obstructive hypertrophic cardiomyopathy: Secondary | ICD-10-CM | POA: Diagnosis not present

## 2021-05-08 DIAGNOSIS — E1122 Type 2 diabetes mellitus with diabetic chronic kidney disease: Secondary | ICD-10-CM | POA: Diagnosis not present

## 2021-05-08 DIAGNOSIS — E041 Nontoxic single thyroid nodule: Secondary | ICD-10-CM | POA: Diagnosis not present

## 2021-05-08 DIAGNOSIS — I129 Hypertensive chronic kidney disease with stage 1 through stage 4 chronic kidney disease, or unspecified chronic kidney disease: Secondary | ICD-10-CM | POA: Diagnosis not present

## 2021-05-08 DIAGNOSIS — R55 Syncope and collapse: Secondary | ICD-10-CM | POA: Diagnosis not present

## 2021-05-08 DIAGNOSIS — N1831 Chronic kidney disease, stage 3a: Secondary | ICD-10-CM | POA: Diagnosis not present

## 2021-05-08 DIAGNOSIS — R2689 Other abnormalities of gait and mobility: Secondary | ICD-10-CM | POA: Diagnosis not present

## 2021-05-09 ENCOUNTER — Emergency Department (HOSPITAL_COMMUNITY): Payer: Medicare Other

## 2021-05-09 ENCOUNTER — Other Ambulatory Visit (HOSPITAL_COMMUNITY): Payer: Self-pay

## 2021-05-09 ENCOUNTER — Emergency Department (HOSPITAL_COMMUNITY)
Admission: EM | Admit: 2021-05-09 | Discharge: 2021-05-09 | Disposition: A | Discharge status: Home / Self Care | Payer: Medicare Other | Attending: Emergency Medicine | Admitting: Emergency Medicine

## 2021-05-09 DIAGNOSIS — I129 Hypertensive chronic kidney disease with stage 1 through stage 4 chronic kidney disease, or unspecified chronic kidney disease: Secondary | ICD-10-CM | POA: Diagnosis not present

## 2021-05-09 DIAGNOSIS — R6889 Other general symptoms and signs: Secondary | ICD-10-CM | POA: Diagnosis not present

## 2021-05-09 DIAGNOSIS — Z743 Need for continuous supervision: Secondary | ICD-10-CM | POA: Diagnosis not present

## 2021-05-09 DIAGNOSIS — E1122 Type 2 diabetes mellitus with diabetic chronic kidney disease: Secondary | ICD-10-CM | POA: Insufficient documentation

## 2021-05-09 DIAGNOSIS — W1839XA Other fall on same level, initial encounter: Secondary | ICD-10-CM | POA: Diagnosis not present

## 2021-05-09 DIAGNOSIS — R55 Syncope and collapse: Secondary | ICD-10-CM | POA: Insufficient documentation

## 2021-05-09 DIAGNOSIS — J984 Other disorders of lung: Secondary | ICD-10-CM | POA: Diagnosis not present

## 2021-05-09 DIAGNOSIS — W19XXXA Unspecified fall, initial encounter: Secondary | ICD-10-CM | POA: Diagnosis not present

## 2021-05-09 DIAGNOSIS — M25511 Pain in right shoulder: Secondary | ICD-10-CM | POA: Diagnosis not present

## 2021-05-09 DIAGNOSIS — S0990XA Unspecified injury of head, initial encounter: Secondary | ICD-10-CM | POA: Diagnosis not present

## 2021-05-09 DIAGNOSIS — N183 Chronic kidney disease, stage 3 unspecified: Secondary | ICD-10-CM | POA: Insufficient documentation

## 2021-05-09 DIAGNOSIS — Y9301 Activity, walking, marching and hiking: Secondary | ICD-10-CM | POA: Diagnosis not present

## 2021-05-09 DIAGNOSIS — I700 Atherosclerosis of aorta: Secondary | ICD-10-CM | POA: Diagnosis not present

## 2021-05-09 DIAGNOSIS — Z043 Encounter for examination and observation following other accident: Secondary | ICD-10-CM | POA: Diagnosis not present

## 2021-05-09 DIAGNOSIS — Z79899 Other long term (current) drug therapy: Secondary | ICD-10-CM | POA: Insufficient documentation

## 2021-05-09 DIAGNOSIS — R404 Transient alteration of awareness: Secondary | ICD-10-CM | POA: Diagnosis not present

## 2021-05-09 DIAGNOSIS — R609 Edema, unspecified: Secondary | ICD-10-CM | POA: Diagnosis not present

## 2021-05-09 DIAGNOSIS — S46012A Strain of muscle(s) and tendon(s) of the rotator cuff of left shoulder, initial encounter: Secondary | ICD-10-CM | POA: Diagnosis not present

## 2021-05-09 LAB — BASIC METABOLIC PANEL
Anion gap: 7 (ref 5–15)
BUN: 29 mg/dL — ABNORMAL HIGH (ref 8–23)
CO2: 16 mmol/L — ABNORMAL LOW (ref 22–32)
Calcium: 9.9 mg/dL (ref 8.9–10.3)
Chloride: 118 mmol/L — ABNORMAL HIGH (ref 98–111)
Creatinine, Ser: 1.21 mg/dL — ABNORMAL HIGH (ref 0.44–1.00)
GFR, Estimated: 44 mL/min — ABNORMAL LOW (ref >60)
Glucose, Bld: 113 mg/dL — ABNORMAL HIGH (ref 70–99)
Potassium: 5 mmol/L (ref 3.5–5.1)
Sodium: 141 mmol/L (ref 135–145)

## 2021-05-09 LAB — CBC WITH DIFFERENTIAL/PLATELET
Abs Immature Granulocytes: 0.06 10*3/uL (ref 0.00–0.07)
Basophils Absolute: 0.1 10*3/uL (ref 0.0–0.1)
Basophils Relative: 1 %
Eosinophils Absolute: 0.1 10*3/uL (ref 0.0–0.5)
Eosinophils Relative: 0 %
HCT: 41.3 % (ref 36.0–46.0)
Hemoglobin: 13.3 g/dL (ref 12.0–15.0)
Immature Granulocytes: 1 %
Lymphocytes Relative: 16 %
Lymphs Abs: 1.8 10*3/uL (ref 0.7–4.0)
MCH: 28.9 pg (ref 26.0–34.0)
MCHC: 32.2 g/dL (ref 30.0–36.0)
MCV: 89.8 fL (ref 80.0–100.0)
Monocytes Absolute: 0.6 10*3/uL (ref 0.1–1.0)
Monocytes Relative: 5 %
Neutro Abs: 8.8 10*3/uL — ABNORMAL HIGH (ref 1.7–7.7)
Neutrophils Relative %: 77 %
Platelets: 335 10*3/uL (ref 150–400)
RBC: 4.6 MIL/uL (ref 3.87–5.11)
RDW: 15.8 % — ABNORMAL HIGH (ref 11.5–15.5)
WBC: 11.3 10*3/uL — ABNORMAL HIGH (ref 4.0–10.5)
nRBC: 0 % (ref 0.0–0.2)

## 2021-05-09 LAB — TROPONIN I (HIGH SENSITIVITY): Troponin I (High Sensitivity): 4 ng/L (ref <18)

## 2021-05-09 MED ORDER — HYDROCODONE-ACETAMINOPHEN 5-325 MG PO TABS
1.0000 | ORAL_TABLET | Freq: Four times a day (QID) | ORAL | 0 refills | Status: DC | PRN
  Filled 2021-05-09: qty 10, 3d supply, fill #0

## 2021-05-09 MED ORDER — SODIUM CHLORIDE 0.9 % IV SOLN: INTRAVENOUS | Status: DC

## 2021-05-09 MED ORDER — HYDROCODONE-ACETAMINOPHEN 5-325 MG PO TABS
1.0000 | ORAL_TABLET | Freq: Once | ORAL | Status: AC
  Administered 2021-05-09: 1 via ORAL
  Filled 2021-05-09: qty 1

## 2021-05-09 NOTE — ED Provider Notes (Signed)
[facility] DEPT Provider Note   CSN: [id number] Arrival date & time: 05/09/21  1406     History Chief Complaint  Patient presents with   [name]    Anna Hoffman is a 86 y.o. female.  86 year old female here after having a syncopal event.  Patient states that Anna Hoffman was walking to get a glass of water and all of a sudden passed out.  Fell onto her right shoulder.  Denies any seizure-like activity.  No chest or abdominal discomfort.  No symptoms prior to her falling.  Patient recently had a cardiac event monitor that was discontinued a few days ago.  States Anna Hoffman feels back to her baseline.  Denies any discomfort from along the waist.  Denies any right hand distal numbness or tingling.      Past Medical History:  Diagnosis Date   Arthritis    Diabetes mellitus without complication ([place])    Hypertension    Hypokalemia    Pneumonia    Shingles    Stroke [facility])     Patient Active Problem List   Diagnosis Date Noted   Gait instability 04/24/2021   Seasonal allergic rhinitis due to pollen 04/24/2021   Ascending aorta dilatation ([place]) 04/05/2021   HOCM (hypertrophic obstructive cardiomyopathy) ([place]) 04/04/2021   Physical deconditioning 04/04/2021   Syncope 04/03/2021   Fall at home, initial encounter 03/31/2021   Thyroid nodule 03/31/2021   CAP (community acquired pneumonia) 11/15/2020   Dehydration 11/15/2020   Erosive esophagitis    Hiatal hernia    Hyponatremia 10/10/2020   Acute kidney injury superimposed on chronic kidney disease ([place]) 10/10/2020   Aortic atherosclerosis ([place]) 10/09/2020   Acute renal failure superimposed on stage 3a chronic kidney disease ([place]) 10/09/2020   Hyperkalemia 10/09/2020   Hearing loss 10/09/2020   Prolonged QT interval 10/09/2020   Colitis, acute 08/11/2019   Colitis 07/11/2019   Loose stools 06/07/2019   Herpes zoster without complication 81/44/8185   Other long term (current) drug therapy 08/12/2018    Chronic kidney disease, stage 3a ([place]) 07/19/2018   Hypertensive nephropathy 07/19/2018   Community acquired pneumonia of right upper lobe of lung 11/07/2017   Hypertensive emergency 07/01/2016   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[place])    Benign essential HTN    Type 2 diabetes mellitus with stage 3 chronic kidney disease, without long-term current use of insulin ([place])    Mixed hyperlipidemia    ICH (intracerebral hemorrhage) ([place]) - hypertensive R thalamic hemorrhage  06/27/2016   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   Retroperitoneal bleed 02/08/2016   Generalized abdominal pain    Gastroenteritis 02/04/2016   Diarrhea 02/04/2016   Hypokalemia 02/04/2016   Incarcerated paraesophageal hernia 09/02/2014   Renal mass, left 09/02/2014   Acute esophagitis 09/02/2014   Gastric outlet obstruction 09/02/2014   Hypertension    Vomiting 09/01/2014    Past Surgical History:  Procedure Laterality Date   ABDOMINAL HYSTERECTOMY     BREAST EXCISIONAL BIOPSY Left    ESOPHAGOGASTRODUODENOSCOPY N/A 09/01/2014   Procedure: ESOPHAGOGASTRODUODENOSCOPY (EGD);  Surgeon: Lear Ng, MD;  Location: [facility];  Service: Endoscopy;  Laterality: N/A;   ESOPHAGOGASTRODUODENOSCOPY (EGD) WITH PROPOFOL N/A 10/12/2020   Procedure: ESOPHAGOGASTRODUODENOSCOPY (EGD) WITH PROPOFOL;  Surgeon: Mauri Pole, MD;  Location: [facility];  Service: Endoscopy;  Laterality: N/A;   HIATAL HERNIA REPAIR N/A 09/04/2014   Procedure: LAPAROSCOPIC REPAIR OF HIATAL HERNIA;  Surgeon: Excell Seltzer, MD;  Location: [facility];  Service: General;  Laterality: N/A;  With MESH   IR GENERIC HISTORICAL  04/10/2016   IR US GUIDE VASC ACCESS RIGHT 04/10/2016 Corrie Mckusick, DO [facility]   IR GENERIC HISTORICAL  04/10/2016   IR ANGIOGRAM SELECTIVE EACH ADDITIONAL VESSEL 04/10/2016 Corrie Mckusick, DO [facility]   IR GENERIC HISTORICAL  04/10/2016   IR ANGIOGRAM SELECTIVE EACH ADDITIONAL  VESSEL 04/10/2016 Corrie Mckusick, DO [facility]   IR GENERIC HISTORICAL  04/10/2016   IR EMBO TUMOR ORGAN ISCHEMIA INFARCT INC GUIDE ROADMAPPING 04/10/2016 Corrie Mckusick, DO [facility]   IR GENERIC HISTORICAL  04/10/2016   IR ANGIOGRAM SELECTIVE EACH ADDITIONAL VESSEL 04/10/2016 Corrie Mckusick, DO [facility]   IR GENERIC HISTORICAL  04/10/2016   IR ANGIOGRAM SELECTIVE EACH ADDITIONAL VESSEL 04/10/2016 Corrie Mckusick, DO [facility]   IR GENERIC HISTORICAL  04/10/2016   IR RENAL SELECTIVE  UNI INC S&I MOD SED 04/10/2016 Corrie Mckusick, DO [facility]   IR GENERIC HISTORICAL  03/06/2016   IR RADIOLOGIST EVAL & MGMT 03/06/2016 Corrie Mckusick, DO [facility]   IR GENERIC HISTORICAL  03/26/2016   IR RADIOLOGIST EVAL & MGMT 03/26/2016 Corrie Mckusick, DO [facility]   IR GENERIC HISTORICAL  04/29/2016   IR RADIOLOGIST EVAL & MGMT 04/29/2016 [facility]   IR RADIOLOGIST EVAL & MGMT  11/12/2016   IR RADIOLOGIST EVAL & MGMT  12/16/2017   KNEE SURGERY     TONSILLECTOMY       OB History   No obstetric history on file.     Family History  Problem Relation Age of Onset   Alzheimer's disease Mother    Prostate cancer Father    Brain cancer Brother    Brain cancer Brother    Pancreatic cancer Sister    Allergic rhinitis Neg Hx    Asthma Neg Hx    Eczema Neg Hx    Urticaria Neg Hx     Social History   Tobacco Use   Smoking status: Never   Smokeless tobacco: Never  Vaping Use   Vaping Use: Never used  Substance Use Topics   Alcohol use: No   Drug use: No    Home Medications Prior to Admission medications   Medication Sig Start Date End Date Taking? Authorizing Provider  acetaminophen (TYLENOL) 325 MG tablet Take 2 tablets (650 mg total) by mouth every 6 (six) hours as needed for mild pain (or Fever >/= 101). 02/08/16   Johnson, Clanford L, MD  Ascorbic Acid (VITAMIN C) 1000 MG tablet Take 1,000 mg by mouth daily.    [provider]  azelastine (ASTELIN) 0.1 % nasal spray Place 2  sprays into both nostrils daily as needed for rhinitis. 10/13/20   Hongalgi, Lenis Dickinson, MD  Carboxymethylcellul-Glycerin (LUBRICATING EYE DROPS OP) Place 1 drop into both eyes daily as needed (dry eyes).    [provider]  fexofenadine (ALLEGRA ALLERGY) 180 MG tablet Take 1 tablet (180 mg total) by mouth daily. 04/23/21   Glendale Chard, MD  Fexofenadine HCl (ALLEGRA PO) Take 180 mg by mouth at bedtime.     [provider]  glucose blood test strip Use as directed to check blood sugars 1 time per day. 03/01/21   Glendale Chard, MD  glucose monitoring kit (FREESTYLE) monitoring kit Use as directed to check blood sugars 1 time per day dx: e11.22 03/01/21   Glendale Chard, MD  Lancets [facility] DELICA PLUS [id number]) MISC Use to check blood sugar once daily 02/07/21  metoprolol tartrate (LOPRESSOR) 50 MG tablet Take 1 tablet (50 mg total) by mouth 2 (two) times daily. 04/25/21   Glendale Chard, MD  pantoprazole (PROTONIX) 40 MG tablet Give 1 tablet by mouth 2 times a day related to gastro-esophageal reflux disease without esophagitis 04/23/21   Glendale Chard, MD  rosuvastatin (CRESTOR) 20 MG tablet Take 1 tablet (20 mg total) by mouth daily. 04/23/21   Glendale Chard, MD  trolamine salicylate (ASPERCREME) 10 % cream Apply 1 application topically daily as needed for muscle pain. Patient not taking: Reported on 04/23/2021    [provider]  vitamin B-12 (CYANOCOBALAMIN) 100 MCG tablet Take 100 mcg by mouth daily.    [provider]    Allergies    Amoxicillin, Ampicillin, and Sulfa antibiotics  Review of Systems   Review of Systems  All other systems reviewed and are negative.  Physical Exam Updated Vital Signs BP (!) 181/97 (BP Location: Left Arm)   Pulse 81   Temp 97.9 F (36.6 C) (Oral)   Resp 18   LMP  (LMP Unknown)   SpO2 96%   Physical Exam Vitals and nursing note reviewed.  Constitutional:      General: Anna Hoffman is not in acute distress.    Appearance:  Normal appearance. Anna Hoffman is well-developed. Anna Hoffman is not toxic-appearing.  HENT:     Head: Normocephalic and atraumatic.  Eyes:     General: Lids are normal.     Conjunctiva/sclera: Conjunctivae normal.     Pupils: Pupils are equal, round, and reactive to light.  Neck:     Thyroid: No thyroid mass.     Trachea: No tracheal deviation.  Cardiovascular:     Rate and Rhythm: Normal rate and regular rhythm.     Heart sounds: Normal heart sounds. No murmur heard.   No gallop.  Pulmonary:     Effort: Pulmonary effort is normal. No respiratory distress.     Breath sounds: Normal breath sounds. No stridor. No decreased breath sounds, wheezing, rhonchi or rales.  Abdominal:     General: There is no distension.     Palpations: Abdomen is soft.     Tenderness: There is no abdominal tenderness. There is no rebound.  Musculoskeletal:     Right shoulder: Tenderness and bony tenderness present. Decreased range of motion.     Cervical back: Normal range of motion and neck supple.     Comments: Neurovascular status intact at right hand  Skin:    General: Skin is warm and dry.     Findings: No abrasion or rash.  Neurological:     Mental Status: Anna Hoffman is alert and oriented to person, place, and time. Mental status is at baseline.     GCS: GCS eye subscore is 4. GCS verbal subscore is 5. GCS motor subscore is 6.     Cranial Nerves: Cranial nerves are intact. No cranial nerve deficit.     Sensory: No sensory deficit.     Motor: Motor function is intact.  Psychiatric:        Attention and Perception: Attention normal.        Speech: Speech normal.        Behavior: Behavior normal.    ED Results / Procedures / Treatments   Labs (all labs ordered are listed, but only abnormal results are displayed) Labs Reviewed  CBC WITH DIFFERENTIAL/PLATELET  BASIC METABOLIC PANEL  TROPONIN I (HIGH SENSITIVITY)    EKG None  Radiology No results found.  Procedures Procedures  Medications Ordered in  ED Medications  0.9 %  sodium chloride infusion (has no administration in time range)    ED Course  I have reviewed the triage vital signs and the nursing notes.  Pertinent labs & imaging results that were available during my care of the patient were reviewed by me and considered in my medical decision making (see chart for details).    MDM Rules/Calculators/A&P                          X-ray of shoulder is negative.  Will place in sling for comfort.  Head CT negative. Patient recently seen by cardiology for syncope.  Discussed with cardiologist just on-call who recommends that patient can be followed up tomorrow in the clinic by Dr. Lovena Le.  Appointment has been scheduled.  Labs are pending at this time and Dr. Vallery Ridge to follow-up Final Clinical Impression(s) / ED Diagnoses Final diagnoses:  Fall    Rx / DC Orders ED Discharge Orders     None        Lacretia Leigh, MD 05/09/21 1639

## 2021-05-09 NOTE — ED Triage Notes (Signed)
Ems brings pt in for a fall today. Pt fell from standing position to floor and now complains of right shoulder pain. Family reports multiple falls recently.

## 2021-05-09 NOTE — ED Notes (Signed)
Patients son would like a call back : [name] [phone]

## 2021-05-10 ENCOUNTER — Encounter: Payer: Self-pay | Admitting: Internal Medicine

## 2021-05-10 ENCOUNTER — Ambulatory Visit: Payer: Medicare Other | Admitting: Internal Medicine

## 2021-05-10 ENCOUNTER — Other Ambulatory Visit: Payer: Self-pay

## 2021-05-10 ENCOUNTER — Ambulatory Visit (INDEPENDENT_AMBULATORY_CARE_PROVIDER_SITE_OTHER): Payer: Medicare Other

## 2021-05-10 VITALS — BP 126/86 | HR 88 | Ht 65.5 in | Wt 138.0 lb

## 2021-05-10 DIAGNOSIS — Z9181 History of falling: Secondary | ICD-10-CM | POA: Diagnosis not present

## 2021-05-10 DIAGNOSIS — R2689 Other abnormalities of gait and mobility: Secondary | ICD-10-CM | POA: Diagnosis not present

## 2021-05-10 DIAGNOSIS — I10 Essential (primary) hypertension: Secondary | ICD-10-CM

## 2021-05-10 DIAGNOSIS — R2681 Unsteadiness on feet: Secondary | ICD-10-CM | POA: Diagnosis not present

## 2021-05-10 DIAGNOSIS — R531 Weakness: Secondary | ICD-10-CM | POA: Diagnosis not present

## 2021-05-10 DIAGNOSIS — R55 Syncope and collapse: Secondary | ICD-10-CM | POA: Diagnosis not present

## 2021-05-10 DIAGNOSIS — E1122 Type 2 diabetes mellitus with diabetic chronic kidney disease: Secondary | ICD-10-CM

## 2021-05-10 NOTE — Progress Notes (Signed)
HPI Mrs. Plato is referred today by Dr. Zenia Resides for evaluation of syncope. She is a pleasant 86 yo woman with HCM, HTN, and chronic renal insufficiency who was admitted with 2:1 AV block in March and this improved, then more syncope the end of August. Her meds have been adjusted but she is still on a beta blocker. She has had documented [place] 2 and at baseline has a long PR interval. She passed out yesterday, one day after she had turned in her Zio monitor. She returns for additional evaluation.  Allergies  Allergen Reactions   Amoxicillin Diarrhea    Has patient had a PCN reaction causing immediate rash, facial/tongue/throat swelling, SOB or lightheadedness with hypotension: no Has patient had a PCN reaction causing severe rash involving mucus membranes or skin necrosis: no Has patient had a PCN reaction that required hospitalization: no pharmacist consult Has patient had a PCN reaction occurring within the last 10 years: yes If all of the above answers are "NO", then may proceed with Cephalosporin use.    Ampicillin Rash    - Tolerates Rocephin and Ancef - Remote occurrence; no symptoms of anaphylaxis or severe cutaneous reaction, and no additional medical attention required     Sulfa Antibiotics Rash     Current Outpatient Medications  Medication Sig Dispense Refill   acetaminophen (TYLENOL) 325 MG tablet Take 2 tablets (650 mg total) by mouth every 6 (six) hours as needed for mild pain (or Fever >/= 101).     Ascorbic Acid (VITAMIN C) 1000 MG tablet Take 1,000 mg by mouth daily.     azelastine (ASTELIN) 0.1 % nasal spray Place 2 sprays into both nostrils daily as needed for rhinitis.     Carboxymethylcellul-Glycerin (LUBRICATING EYE DROPS OP) Place 1 drop into both eyes daily as needed (dry eyes).     fexofenadine (ALLEGRA ALLERGY) 180 MG tablet Take 1 tablet (180 mg total) by mouth daily. 90 tablet 2   Fexofenadine HCl (ALLEGRA PO) Take 180 mg by mouth at bedtime.       glucose blood test strip Use as directed to check blood sugars 1 time per day. 100 each 11   glucose monitoring kit (FREESTYLE) monitoring kit Use as directed to check blood sugars 1 time per day dx: e11.22 1 each 1   HYDROcodone-acetaminophen (NORCO/VICODIN) 5-325 MG tablet Take 1 tablet by mouth every 6 (six) hours as needed. 10 tablet 0   Lancets (ONETOUCH DELICA PLUS [id number]) MISC Use to check blood sugar once daily 100 each 12   metoprolol tartrate (LOPRESSOR) 50 MG tablet Take 1 tablet (50 mg total) by mouth 2 (two) times daily. 180 tablet 2   pantoprazole (PROTONIX) 40 MG tablet Give 1 tablet by mouth 2 times a day related to gastro-esophageal reflux disease without esophagitis 180 tablet 2   rosuvastatin (CRESTOR) 20 MG tablet Take 1 tablet (20 mg total) by mouth daily. 90 tablet 1   trolamine salicylate (ASPERCREME) 10 % cream Apply 1 application topically daily as needed for muscle pain. (Patient not taking: Reported on 04/23/2021)     vitamin B-12 (CYANOCOBALAMIN) 100 MCG tablet Take 100 mcg by mouth daily.     Current Facility-Administered Medications  Medication Dose Route Frequency Provider Last Rate Last Admin   cyanocobalamin ((VITAMIN B-12)) injection 1,000 mcg  1,000 mcg Intramuscular Once Glendale Chard, MD         Past Medical History:  Diagnosis Date   Arthritis    Diabetes mellitus  without complication ([place])    Hypertension    Hypokalemia    Pneumonia    Shingles    Stroke ([place])     ROS:   All systems reviewed and negative except as noted in the HPI.   Past Surgical History:  Procedure Laterality Date   ABDOMINAL HYSTERECTOMY     BREAST EXCISIONAL BIOPSY Left    ESOPHAGOGASTRODUODENOSCOPY N/A 09/01/2014   Procedure: ESOPHAGOGASTRODUODENOSCOPY (EGD);  Surgeon: Lear Ng, MD;  Location: [facility];  Service: Endoscopy;  Laterality: N/A;   ESOPHAGOGASTRODUODENOSCOPY (EGD) WITH PROPOFOL N/A 10/12/2020   Procedure: ESOPHAGOGASTRODUODENOSCOPY (EGD)  WITH PROPOFOL;  Surgeon: Mauri Pole, MD;  [facility];  Service: Endoscopy;  Laterality: N/A;   HIATAL HERNIA REPAIR N/A 09/04/2014   Procedure: LAPAROSCOPIC REPAIR OF HIATAL HERNIA;  Surgeon: Excell Seltzer, MD;  [facility];  Service: General;  Laterality: N/A;  With MESH   IR GENERIC HISTORICAL  04/10/2016   IR US GUIDE VASC ACCESS RIGHT 04/10/2016 Corrie Mckusick, DO [facility]   IR GENERIC HISTORICAL  04/10/2016   IR ANGIOGRAM SELECTIVE EACH ADDITIONAL VESSEL 04/10/2016 Corrie Mckusick, DO [facility]   IR GENERIC HISTORICAL  04/10/2016   IR ANGIOGRAM SELECTIVE EACH ADDITIONAL VESSEL 04/10/2016 Corrie Mckusick, DO [facility]   IR GENERIC HISTORICAL  04/10/2016   IR EMBO TUMOR ORGAN ISCHEMIA INFARCT INC GUIDE ROADMAPPING 04/10/2016 Corrie Mckusick, DO [facility]   IR GENERIC HISTORICAL  04/10/2016   IR ANGIOGRAM SELECTIVE EACH ADDITIONAL VESSEL 04/10/2016 Corrie Mckusick, DO [facility]   IR GENERIC HISTORICAL  04/10/2016   IR ANGIOGRAM SELECTIVE EACH ADDITIONAL VESSEL 04/10/2016 Corrie Mckusick, DO [facility]   IR GENERIC HISTORICAL  04/10/2016   IR RENAL SELECTIVE  UNI INC S&I MOD SED 04/10/2016 Corrie Mckusick, DO [facility]   IR GENERIC HISTORICAL  03/06/2016   IR RADIOLOGIST EVAL & MGMT 03/06/2016 Corrie Mckusick, DO [facility]   IR GENERIC HISTORICAL  03/26/2016   IR RADIOLOGIST EVAL & MGMT 03/26/2016 Corrie Mckusick, DO [facility]   IR GENERIC HISTORICAL  04/29/2016   IR RADIOLOGIST EVAL & MGMT 04/29/2016 [facility]   IR RADIOLOGIST EVAL & MGMT  11/12/2016   IR RADIOLOGIST EVAL & MGMT  12/16/2017   KNEE SURGERY     TONSILLECTOMY       Family History  Problem Relation Age of Onset   Alzheimer's disease Mother    Prostate cancer Father    Brain cancer Brother    Brain cancer Brother    Pancreatic cancer Sister    Allergic rhinitis Neg Hx    Asthma Neg Hx    Eczema Neg Hx    Urticaria Neg Hx      Social History   Socioeconomic History   Marital status: Widowed     Spouse name: Not on file   Number of children: 2   Years of education: Not on file   Highest education level: Not on file  Occupational History   Occupation: retired  Tobacco Use   Smoking status: Never   Smokeless tobacco: Never  Vaping Use   Vaping Use: Never used  Substance and Sexual Activity   Alcohol use: No   Drug use: No   Sexual activity: Not Currently    Birth control/protection: Surgical  Other Topics Concern   Not on file  Social History Narrative   Son [name] lives with her   Social Determinants of Health   Financial Resource Strain: Low Risk  Difficulty of Paying Living Expenses: Not hard at all  Food Insecurity: No Food Insecurity   Worried About [place] in the Last Year: Never true   Ran Out of Food in the Last Year: Never true  Transportation Needs: No Transportation Needs   Lack of Transportation (Medical): No   Lack of Transportation (Non-Medical): No  Physical Activity: Sufficiently Active   Days of Exercise per Week: 6 days   Minutes of Exercise per Session: 30 min  Stress: No Stress Concern Present   Feeling of Stress : Not at all  Social Connections: Not on file  Intimate Partner Violence: Not on file     LMP  (LMP Unknown)   Physical Exam:  Well appearing elderly woman, NAD HEENT: Unremarkable Neck:  6 cm JVD, no thyromegally Lymphatics:  No adenopathy Back:  No CVA tenderness Lungs:  Clear with no wheezes HEART:  Regular rate rhythm, no murmurs, no rubs, no clicks Abd:  soft, positive bowel sounds, no organomegally, no rebound, no guarding Ext:  2 plus pulses, no edema, no cyanosis, no clubbing Skin:  No rashes no nodules Neuro:  CN II through XII intact, motor grossly intact  EKG - NSR with first degree AV block  Cardiac monitor - pending   Assess/Plan:  Syncope - the etiology is unclear though I strongly suspect that it is due to heart block. I have discussed the various treatment options. We will obtain the  results of her Zio monitor. I strongly suspect that she will need PPM insertion. She would like to wait. Heart block - she has a h/o heart block and passing out including a recent episode of syncope while on beta blockers. I have recommended she wean off of her beta blocker and strongly consider a medtronic ILR. HCM - this is complicating issues and I strongly suspect that she will require a beta blocker.  Carleene Overlie Serafin [place],MD

## 2021-05-10 NOTE — Patient Instructions (Signed)
Social Worker Visit Information  Goals we discussed today:   Goals Addressed             This Visit's Progress    Mobility and Independence Optimized       Timeframe:  Short-Term Goal Priority:  High Start Date:  9.21.22                           Expected End Date: 11.5.22                       Next planned outreach: 10.10.22  Patient Goals/Self-Care Activities patient will: with the help of her son  - Work with SW to address desire for placement         Materials Provided: Verbal education about level of care options provided by phone  Patient verbalizes understanding of instructions provided today and agrees to view in [place].   Follow Up Plan: SW will follow up with patient by phone over the next days   Daneen Schick, BSW, CDP Social Worker, Certified Dementia Practitioner [place] [phone]

## 2021-05-10 NOTE — Chronic Care Management (AMB) (Signed)
Chronic Care Management    Social Work Note  05/10/2021 Name: Anna Hoffman MRN: [id number] DOB: [birth year]  Anna Hoffman is a 86 y.o. year old female who is a primary care patient of Glendale Chard, MD. The CCM team was consulted to assist the patient with chronic disease management and/or care coordination needs related to:  HTN, Colitis, DM II .   Engaged with patients son by phone  for follow up visit in response to provider referral for social work chronic care management and care coordination services.   Consent to Services:  The patient was given information about Chronic Care Management services, agreed to services, and gave verbal consent prior to initiation of services.  Please see initial visit note for detailed documentation.   Patient agreed to services and consent obtained.   Assessment: Review of patient past medical history, allergies, medications, and health status, including review of relevant consultants reports was performed today as part of a comprehensive evaluation and provision of chronic care management and care coordination services.     SDOH (Social Determinants of Health) assessments and interventions performed:    Advanced Directives Status: Not addressed in this encounter.  CCM Care Plan  Allergies  Allergen Reactions   Amoxicillin Diarrhea    Has patient had a PCN reaction causing immediate rash, facial/tongue/throat swelling, SOB or lightheadedness with hypotension: no Has patient had a PCN reaction causing severe rash involving mucus membranes or skin necrosis: no Has patient had a PCN reaction that required hospitalization: no pharmacist consult Has patient had a PCN reaction occurring within the last 10 years: yes If all of the above answers are "NO", then may proceed with Cephalosporin use.    Ampicillin Rash    - Tolerates Rocephin and Ancef - Remote occurrence; no symptoms of anaphylaxis or severe cutaneous reaction, and no additional  medical attention required     Sulfa Antibiotics Rash    Outpatient Encounter Medications as of 05/10/2021  Medication Sig   acetaminophen (TYLENOL) 325 MG tablet Take 2 tablets (650 mg total) by mouth every 6 (six) hours as needed for mild pain (or Fever >/= 101).   Ascorbic Acid (VITAMIN C) 1000 MG tablet Take 1,000 mg by mouth daily.   azelastine (ASTELIN) 0.1 % nasal spray Place 2 sprays into both nostrils daily as needed for rhinitis.   Carboxymethylcellul-Glycerin (LUBRICATING EYE DROPS OP) Place 1 drop into both eyes daily as needed (dry eyes).   fexofenadine (ALLEGRA ALLERGY) 180 MG tablet Take 1 tablet (180 mg total) by mouth daily.   Fexofenadine HCl (ALLEGRA PO) Take 180 mg by mouth at bedtime.    glucose blood test strip Use as directed to check blood sugars 1 time per day.   glucose monitoring kit (FREESTYLE) monitoring kit Use as directed to check blood sugars 1 time per day dx: e11.22   HYDROcodone-acetaminophen (NORCO/VICODIN) 5-325 MG tablet Take 1 tablet by mouth every 6 (six) hours as needed.   Lancets (ONETOUCH DELICA PLUS [id number]) MISC Use to check blood sugar once daily   metoprolol tartrate (LOPRESSOR) 50 MG tablet Take 1 tablet (50 mg total) by mouth 2 (two) times daily.   pantoprazole (PROTONIX) 40 MG tablet Give 1 tablet by mouth 2 times a day related to gastro-esophageal reflux disease without esophagitis   rosuvastatin (CRESTOR) 20 MG tablet Take 1 tablet (20 mg total) by mouth daily.   trolamine salicylate (ASPERCREME) 10 % cream Apply 1 application topically daily as needed for muscle pain. (Patient  not taking: Reported on 04/23/2021)   vitamin B-12 (CYANOCOBALAMIN) 100 MCG tablet Take 100 mcg by mouth daily.   Facility-Administered Encounter Medications as of 05/10/2021  Medication   cyanocobalamin ((VITAMIN B-12)) injection 1,000 mcg    Patient Active Problem List   Diagnosis Date Noted   Gait instability 04/24/2021   Seasonal allergic rhinitis due to  pollen 04/24/2021   Ascending aorta dilatation ([place]) 04/05/2021   HOCM (hypertrophic obstructive cardiomyopathy) ([place]) 04/04/2021   Physical deconditioning 04/04/2021   Syncope 04/03/2021   Fall at home, initial encounter 03/31/2021   Thyroid nodule 03/31/2021   CAP (community acquired pneumonia) 11/15/2020   Dehydration 11/15/2020   Erosive esophagitis    Hiatal hernia    Hyponatremia 10/10/2020   Acute kidney injury superimposed on chronic kidney disease ([place]) 10/10/2020   Aortic atherosclerosis ([place]) 10/09/2020   Acute renal failure superimposed on stage 3a chronic kidney disease ([place]) 10/09/2020   Hyperkalemia 10/09/2020   Hearing loss 10/09/2020   Prolonged QT interval 10/09/2020   Colitis, acute 08/11/2019   Colitis 07/11/2019   Loose stools 06/07/2019   Herpes zoster without complication 38/05/1750   Other long term (current) drug therapy 08/12/2018   Chronic kidney disease, stage 3a ([place]) 07/19/2018   Hypertensive nephropathy 07/19/2018   Community acquired pneumonia of right upper lobe of lung 11/07/2017   Hypertensive emergency 07/01/2016   Thalamic hemorrhage ([place]) 07/01/2016   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   Retroperitoneal bleed 02/08/2016   Generalized abdominal pain    Gastroenteritis 02/04/2016   Diarrhea 02/04/2016   Hypokalemia 02/04/2016   Incarcerated paraesophageal hernia 09/02/2014   Renal mass, left 09/02/2014   Acute esophagitis 09/02/2014   Gastric outlet obstruction 09/02/2014   Hypertension    Vomiting 09/01/2014    Conditions to be addressed/monitored: HTN, DMII, and Colitis ; Level of care concerns  Care Plan : Social Work Care Plan  Updates  made by Daneen Schick since 05/10/2021 12:00 AM     Problem: Mobility and Independence      Goal: Mobility and Independence Optimized   Start Date: 04/24/2021  Expected End Date: 06/08/2021  Priority: High  Note:   Current Barriers:  Chronic disease management support and education needs related to HTN and DM  ADL IADL limitations and Limited access to caregiver  Social Worker Clinical Goal(s):  patient will work with SW to identify and address any acute and/or chronic care coordination needs related to the self health management of HTN and DM  patient will work with SW to address concerns related to level of care needs  SW Interventions:  Inter-disciplinary care team collaboration (see longitudinal plan of care) Collaboration with Glendale Chard, MD regarding development and update of comprehensive plan of care as evidenced by provider attestation and co-signature SW received email correspondence from the patients son indicating patient has a recent fall and family would like SW intervention to assist with placement needs Successful outbound call placed to the patients son [name] Discussed the patients local son who lives with her has an inconsistent schedule and is unable to provide a lot of oversight of the patient. This son is also engaged and will plan to move out of the  patients home soon The patient does not qualify for PCS and patient and her family are unable to afford private duty caregivers in the home Reviewed options for placement including ALF and SNF - explaining the difference of each level of care Determined the family would plan to apply for Medicaid to assist with covering the cost of placement Advised [name] SW would collaborate with the patients primary care provider to identify appropriate level of care in order to proceed with placement process Collaboration with Dr. Baird Cancer advising of families intention to place patient and to request which level of care she feels is  appropriate for the patient SW will follow up with [name] over the next 5 days to continue placement process  Patient Goals/Self-Care Activities patient will: with the help of her son  -  Work with SW to address desire for placement  Follow Up Plan:  SW will follow up with the patient over the next 5 days       Follow Up Plan: SW will follow up with patient by phone over the next 5 days      Daneen Schick, BSW, CDP Social Worker, Certified Dementia Practitioner [place] [facility] [phone]

## 2021-05-10 NOTE — Patient Instructions (Addendum)
Medication Instructions:  Your physician has recommended you make the following change in your medication:    You are going to [facility] OFF your metoprolol. Take 1/2 tablet by mouth TWICE a day for 7 days THEN reduce to a 1/2 tablet ONCE a day for 7 days THEN STOP   Labwork: None ordered.  Testing/Procedures: None ordered.  Follow-Up: Your physician wants you to follow-up as needed with Dr. Lovena Le.  Any Other Special Instructions Will Be Listed Below (If Applicable).  If you need a refill on your cardiac medications before your next appointment, please call your pharmacy.

## 2021-05-13 ENCOUNTER — Ambulatory Visit: Payer: Medicare Other

## 2021-05-13 DIAGNOSIS — N1831 Chronic kidney disease, stage 3a: Secondary | ICD-10-CM

## 2021-05-13 DIAGNOSIS — I10 Essential (primary) hypertension: Secondary | ICD-10-CM

## 2021-05-13 NOTE — Patient Instructions (Signed)
Social Worker Visit Information  Goals we discussed today:   Goals Addressed             This Visit's Progress    Mobility and Independence Optimized       Timeframe:  Short-Term Goal Priority:  High Start Date:  9.21.22                           Expected End Date: 11.5.22                       Next planned outreach: 10.24.22  Patient Goals/Self-Care Activities patient will: with the help of her son  - Work with SW to address desire for placement -Complete Medicaid application         Materials Provided: Verbal education about placement process provided by phone  Patient verbalizes understanding of instructions provided today and agrees to view in [place].   Follow Up Plan: SW will follow up with patient by phone over the next two weeks   Daneen Schick, BSW, CDP Social Worker, Certified Dementia Practitioner [place] [facility] [phone]

## 2021-05-13 NOTE — Chronic Care Management (AMB) (Signed)
Chronic Care Management    Social Work Note  05/13/2021 Name: Anna Hoffman MRN: [id number] DOB: 1934-08-06  Anna Hoffman is a 86 y.o. year old female who is a primary care patient of Glendale Chard, MD. The CCM team was consulted to assist the patient with chronic disease management and/or care coordination needs related to:  HTN, Colitis, and DM II .   Engaged with patients son [name] by phone  for follow up visit in response to provider referral for social work chronic care management and care coordination services.   Consent to Services:  The patient was given information about Chronic Care Management services, agreed to services, and gave verbal consent prior to initiation of services.  Please see initial visit note for detailed documentation.   Patient agreed to services and consent obtained.   Assessment: Review of patient past medical history, allergies, medications, and health status, including review of relevant consultants reports was performed today as part of a comprehensive evaluation and provision of chronic care management and care coordination services.     SDOH (Social Determinants of Health) assessments and interventions performed:    Advanced Directives Status: Not addressed in this encounter.  CCM Care Plan  Allergies  Allergen Reactions   Amoxicillin Diarrhea    Has patient had a PCN reaction causing immediate rash, facial/tongue/throat swelling, SOB or lightheadedness with hypotension: no Has patient had a PCN reaction causing severe rash involving mucus membranes or skin necrosis: no Has patient had a PCN reaction that required hospitalization: no pharmacist consult Has patient had a PCN reaction occurring within the last 10 years: yes If all of the above answers are "NO", then may proceed with Cephalosporin use.    Ampicillin Rash    - Tolerates Rocephin and Ancef - Remote occurrence; no symptoms of anaphylaxis or severe cutaneous reaction, and no  additional medical attention required     Sulfa Antibiotics Rash    Outpatient Encounter Medications as of 05/13/2021  Medication Sig   acetaminophen (TYLENOL) 325 MG tablet Take 2 tablets (650 mg total) by mouth every 6 (six) hours as needed for mild pain (or Fever >/= 101).   Ascorbic Acid (VITAMIN C) 1000 MG tablet Take 1,000 mg by mouth daily.   azelastine (ASTELIN) 0.1 % nasal spray Place 2 sprays into both nostrils daily as needed for rhinitis.   Carboxymethylcellul-Glycerin (LUBRICATING EYE DROPS OP) Place 1 drop into both eyes daily as needed (dry eyes).   fexofenadine (ALLEGRA ALLERGY) 180 MG tablet Take 1 tablet (180 mg total) by mouth daily.   Fexofenadine HCl (ALLEGRA PO) Take 180 mg by mouth at bedtime.    glucose blood test strip Use as directed to check blood sugars 1 time per day.   glucose monitoring kit (FREESTYLE) monitoring kit Use as directed to check blood sugars 1 time per day dx: e11.22   HYDROcodone-acetaminophen (NORCO/VICODIN) 5-325 MG tablet Take 1 tablet by mouth every 6 (six) hours as needed.   Lancets (ONETOUCH DELICA PLUS [id number]) MISC Use to check blood sugar once daily   pantoprazole (PROTONIX) 40 MG tablet Give 1 tablet by mouth 2 times a day related to gastro-esophageal reflux disease without esophagitis   rosuvastatin (CRESTOR) 20 MG tablet Take 1 tablet (20 mg total) by mouth daily.   trolamine salicylate (ASPERCREME) 10 % cream Apply 1 application topically daily as needed for muscle pain.   vitamin B-12 (CYANOCOBALAMIN) 100 MCG tablet Take 100 mcg by mouth daily.   Facility-Administered Encounter Medications  as of 05/13/2021  Medication   cyanocobalamin ((VITAMIN B-12)) injection 1,000 mcg    Patient Active Problem List   Diagnosis Date Noted   Gait instability 04/24/2021   Seasonal allergic rhinitis due to pollen 04/24/2021   Ascending aorta dilatation ([place]) 04/05/2021   HOCM (hypertrophic obstructive cardiomyopathy) ([place]) 04/04/2021    Physical deconditioning 04/04/2021   Syncope 04/03/2021   Fall at home, initial encounter 03/31/2021   Thyroid nodule 03/31/2021   CAP (community acquired pneumonia) 11/15/2020   Dehydration 11/15/2020   Erosive esophagitis    Hiatal hernia    Hyponatremia 10/10/2020   Acute kidney injury superimposed on chronic kidney disease ([place]) 10/10/2020   Aortic atherosclerosis ([place]) 10/09/2020   Acute renal failure superimposed on stage 3a chronic kidney disease ([place]) 10/09/2020   Hyperkalemia 10/09/2020   Hearing loss 10/09/2020   Prolonged QT interval 10/09/2020   Colitis, acute 08/11/2019   Colitis 07/11/2019   Loose stools 06/07/2019   Herpes zoster without complication 55/97/4163   Other long term (current) drug therapy 08/12/2018   Chronic kidney disease, stage 3a ([place]) 07/19/2018   Hypertensive nephropathy 07/19/2018   Community acquired pneumonia of right upper lobe of lung 11/07/2017   Hypertensive emergency 07/01/2016   Thalamic hemorrhage ([place]) 07/01/2016   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[place])    Benign essential HTN    Type 2 diabetes mellitus with stage 3 chronic kidney disease, without long-term current use of insulin ([place])    Mixed hyperlipidemia    ICH (intracerebral hemorrhage) ([place]) - hypertensive R thalamic hemorrhage  06/27/2016   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   Retroperitoneal bleed 02/08/2016   Generalized abdominal pain    Gastroenteritis 02/04/2016   Diarrhea 02/04/2016   Hypokalemia 02/04/2016   Incarcerated paraesophageal hernia 09/02/2014   Renal mass, left 09/02/2014   Acute esophagitis 09/02/2014   Gastric outlet obstruction 09/02/2014   Hypertension    Vomiting 09/01/2014    Conditions to be addressed/monitored: HTN, DMII, and Colitis ; Level of care concerns  Care Plan : Social Work Care Plan  Updates made by Daneen Schick since 05/13/2021 12:00 AM     Problem: Mobility and Independence      Goal: Mobility and Independence  Optimized   Start Date: 04/24/2021  Expected End Date: 06/08/2021  Priority: High  Note:   Current Barriers:  Chronic disease management support and education needs related to HTN and DM  ADL IADL limitations and Limited access to caregiver  Social Worker Clinical Goal(s):  patient will work with SW to identify and address any acute and/or chronic care coordination needs related to the self health management of HTN and DM  patient will work with SW to address concerns related to level of care needs  SW Interventions:  Inter-disciplinary care team collaboration (see longitudinal plan of care) Collaboration with [place] Chard, MD regarding development and update of comprehensive plan of care as evidenced by provider attestation and co-signature Collaboration with Dr. Baird Cancer to determine appropriate level of care is assisted living Successful outbound call placed to the patients son [name] to discuss next steps with placement Provided [name] with paper application to complete a Medicaid application as well as the web address if he chooses to submit online Advised that once the application is submitted SW will complete FL2 to begin seeking available beds Discussed the patient has been feeling down lately over the thought of placement Successful outbound call placed to the patient to check in with her Patient reports she is doing  ok just sore from last weeks fall Advised patient SW will work with [name] to address care needs  Patient Goals/Self-Care Activities patient will: with the help of her son  -  Work with SW to address desire for placement -Complete Medicaid application  Follow Up Plan:  SW will follow up with the patient over the next 14 days       Follow Up Plan: SW will follow up with patient by phone over the next two weeks      Daneen Schick, BSW, CDP Social Worker, Certified Dementia Practitioner [place] [facility] [phone]

## 2021-05-14 DIAGNOSIS — E041 Nontoxic single thyroid nodule: Secondary | ICD-10-CM | POA: Diagnosis not present

## 2021-05-14 DIAGNOSIS — N1831 Chronic kidney disease, stage 3a: Secondary | ICD-10-CM | POA: Diagnosis not present

## 2021-05-14 DIAGNOSIS — I129 Hypertensive chronic kidney disease with stage 1 through stage 4 chronic kidney disease, or unspecified chronic kidney disease: Secondary | ICD-10-CM | POA: Diagnosis not present

## 2021-05-14 DIAGNOSIS — R2689 Other abnormalities of gait and mobility: Secondary | ICD-10-CM | POA: Diagnosis not present

## 2021-05-14 DIAGNOSIS — I421 Obstructive hypertrophic cardiomyopathy: Secondary | ICD-10-CM | POA: Diagnosis not present

## 2021-05-14 DIAGNOSIS — R55 Syncope and collapse: Secondary | ICD-10-CM | POA: Diagnosis not present

## 2021-05-14 DIAGNOSIS — E1122 Type 2 diabetes mellitus with diabetic chronic kidney disease: Secondary | ICD-10-CM | POA: Diagnosis not present

## 2021-05-15 NOTE — Progress Notes (Signed)
Cardiology Office Note:    Date:  05/16/2021   ID:  Angelik Walls, DOB August 13, 1934, MRN [id number]  PCP:  Glendale Chard, MD  Cardiologist:  None  Electrophysiologist:  None   Referring MD: Glendale Chard, MD   Chief Complaint  Patient presents with   Cardiomyopathy    History of Present Illness:    Anna Hoffman is a 86 y.o. female with a hx of hypertrophic cardiomyopathy, CVA, T2DM, hypertension, hyperlipidemia who presents for follow-up.  She was hospitalized in August 2022 with syncopal episode.  Echocardiogram 04/01/2021 showed severe basal septal hypertrophy, hyperdynamic LV systolic function, normal RV function, mild AI and mild dilatation of ascending aorta measuring 41 mm.  Cardiac MRI on 04/03/2021 showed asymmetric LV hypertrophy measuring up to 19 mm and basal septum (11 mm and posterior wall) consistent with hypertrophic cardiomyopathy, patchy LGE at RV insertion site (LGE accounts for 2% of total myocardial mass), normal biventricular function.  Zio patch x2 weeks on 05/13/2021 showed 1 run of NSVT lasting 4 beats, Wenckebach.  She was seen by Dr. Lovena Le in EP on 05/10/2021, suspected syncope was due to heart block (had 2-1 AV block during admission in March 2022).  Suspected she would need pacemaker, but patient declined at this time.  Recommended considering loop recorder.  Reports she had syncopal episode last week.  Had sudden loss of consciousness.  Saw Dr. Lovena Le, recommended pacemaker, she is considering.  Denies any chest pain, dyspnea, lower extremity edema, or palpitations.  Past Medical History:  Diagnosis Date   Arthritis    Diabetes mellitus without complication ([place])    Hypertension    Hypokalemia    Pneumonia    Shingles    Stroke [facility])     Past Surgical History:  Procedure Laterality Date   ABDOMINAL HYSTERECTOMY     BREAST EXCISIONAL BIOPSY Left    ESOPHAGOGASTRODUODENOSCOPY N/A 09/01/2014   Procedure: ESOPHAGOGASTRODUODENOSCOPY (EGD);  Surgeon:  Lear Ng, MD;  Location: [facility];  Service: Endoscopy;  Laterality: N/A;   ESOPHAGOGASTRODUODENOSCOPY (EGD) WITH PROPOFOL N/A 10/12/2020   Procedure: ESOPHAGOGASTRODUODENOSCOPY (EGD) WITH PROPOFOL;  Surgeon: Mauri Pole, MD;  Location: [facility];  Service: Endoscopy;  Laterality: N/A;   HIATAL HERNIA REPAIR N/A 09/04/2014   Procedure: LAPAROSCOPIC REPAIR OF HIATAL HERNIA;  Surgeon: Excell Seltzer, MD;  Location: [facility];  Service: General;  Laterality: N/A;  With MESH   IR GENERIC HISTORICAL  04/10/2016   IR US GUIDE VASC ACCESS RIGHT 04/10/2016 Corrie Mckusick, DO [facility]   IR GENERIC HISTORICAL  04/10/2016   IR ANGIOGRAM SELECTIVE EACH ADDITIONAL VESSEL 04/10/2016 Corrie Mckusick, DO [facility]   IR GENERIC HISTORICAL  04/10/2016   IR ANGIOGRAM SELECTIVE EACH ADDITIONAL VESSEL 04/10/2016 Corrie Mckusick, DO [facility]   IR GENERIC HISTORICAL  04/10/2016   IR EMBO TUMOR ORGAN ISCHEMIA INFARCT INC GUIDE ROADMAPPING 04/10/2016 Corrie Mckusick, DO [facility]   IR GENERIC HISTORICAL  04/10/2016   IR ANGIOGRAM SELECTIVE EACH ADDITIONAL VESSEL 04/10/2016 Corrie Mckusick, DO [facility]   IR GENERIC HISTORICAL  04/10/2016   IR ANGIOGRAM SELECTIVE EACH ADDITIONAL VESSEL 04/10/2016 Corrie Mckusick, DO [facility]   IR GENERIC HISTORICAL  04/10/2016   IR RENAL SELECTIVE  UNI INC S&I MOD SED 04/10/2016 Corrie Mckusick, DO [facility]   IR GENERIC HISTORICAL  03/06/2016   IR RADIOLOGIST EVAL & MGMT 03/06/2016 Corrie Mckusick, DO [facility]   IR GENERIC HISTORICAL  03/26/2016   IR RADIOLOGIST EVAL & MGMT 03/26/2016  Corrie Mckusick, DO [facility]   IR GENERIC HISTORICAL  04/29/2016   IR RADIOLOGIST EVAL & MGMT 04/29/2016 [facility]   IR RADIOLOGIST EVAL & MGMT  11/12/2016   IR RADIOLOGIST EVAL & MGMT  12/16/2017   KNEE SURGERY     TONSILLECTOMY      Current Medications: Current Meds  Medication Sig   acetaminophen (TYLENOL) 325 MG tablet Take 2 tablets (650 mg total) by mouth every 6 (six)  hours as needed for mild pain (or Fever >/= 101).   Ascorbic Acid (VITAMIN C) 1000 MG tablet Take 1,000 mg by mouth daily.   azelastine (ASTELIN) 0.1 % nasal spray Place 2 sprays into both nostrils daily as needed for rhinitis.   Carboxymethylcellul-Glycerin (LUBRICATING EYE DROPS OP) Place 1 drop into both eyes daily as needed (dry eyes).   fexofenadine (ALLEGRA ALLERGY) 180 MG tablet Take 1 tablet (180 mg total) by mouth daily.   Fexofenadine HCl (ALLEGRA PO) Take 180 mg by mouth at bedtime.    glucose blood test strip Use as directed to check blood sugars 1 time per day.   glucose monitoring kit (FREESTYLE) monitoring kit Use as directed to check blood sugars 1 time per day dx: e11.22   HYDROcodone-acetaminophen (NORCO/VICODIN) 5-325 MG tablet Take 1 tablet by mouth every 6 (six) hours as needed.   Lancets (ONETOUCH DELICA PLUS CHENID78E) MISC Use to check blood sugar once daily   pantoprazole (PROTONIX) 40 MG tablet Give 1 tablet by mouth 2 times a day related to gastro-esophageal reflux disease without esophagitis   rosuvastatin (CRESTOR) 20 MG tablet Take 1 tablet (20 mg total) by mouth daily.   trolamine salicylate (ASPERCREME) 10 % cream Apply 1 application topically daily as needed for muscle pain.   vitamin B-12 (CYANOCOBALAMIN) 100 MCG tablet Take 100 mcg by mouth daily.   Current Facility-Administered Medications for the 05/16/21 encounter (Office Visit) with Donato Heinz, MD  Medication   cyanocobalamin ((VITAMIN B-12)) injection 1,000 mcg     Allergies:   Amoxicillin, Ampicillin, and Sulfa antibiotics   Social History   Socioeconomic History   Marital status: Widowed    Spouse name: Not on file   Number of children: 2   Years of education: Not on file   Highest education level: Not on file  Occupational History   Occupation: retired  Tobacco Use   Smoking status: Never   Smokeless tobacco: Never  Vaping Use   Vaping Use: Never used  Substance and Sexual  Activity   Alcohol use: No   Drug use: No   Sexual activity: Not Currently    Birth control/protection: Surgical  Other Topics Concern   Not on file  Social History Narrative   Son [name] lives with her   Social Determinants of Health   Financial Resource Strain: Low Risk    Difficulty of Paying Living Expenses: Not hard at all  Food Insecurity: No Food Insecurity   Worried About Charity fundraiser in the Last Year: Never true   Arboriculturist in the Last Year: Never true  Transportation Needs: No Transportation Needs   Lack of Transportation (Medical): No   Lack of Transportation (Non-Medical): No  Physical Activity: Sufficiently Active   Days of Exercise per Week: 6 days   Minutes of Exercise per Session: 30 min  Stress: No Stress Concern Present   Feeling of Stress : Not at all  Social Connections: Not on file  Family History: The patient's family history includes Alzheimer's disease in her mother; Brain cancer in her brother and brother; Pancreatic cancer in her sister; Prostate cancer in her father. There is no history of Allergic rhinitis, Asthma, Eczema, or Urticaria.  ROS:   Please see the history of present illness.     All other systems reviewed and are negative.  EKGs/Labs/Other Studies Reviewed:    The following studies were reviewed today:   EKG:  EKG is not ordered today.    Recent Labs: 04/01/2021: TSH 0.801 04/09/2021: ALT 16; Magnesium 1.9 05/09/2021: BUN 29; Creatinine, Ser 1.21; Hemoglobin 13.3; Platelets 335; Potassium 5.0; Sodium 141  Recent Lipid Panel    Component Value Date/Time   CHOL 150 10/18/2020 1549   TRIG 153 (H) 10/18/2020 1549   HDL 74 10/18/2020 1549   CHOLHDL 2.0 10/18/2020 1549   CHOLHDL 3.5 06/29/2016 0330   VLDL 36 06/29/2016 0330   LDLCALC 51 10/18/2020 1549    Physical Exam:    VS:  BP 135/65   Pulse 64   Ht _0  (1.651 m)   Wt 138 lb (62.6 kg)   LMP  (LMP Unknown)   SpO2 96%   BMI 22.96 kg/m     Wt  Readings from Last 3 Encounters:  05/16/21 138 lb (62.6 kg)  05/10/21 138 lb (62.6 kg)  05/07/21 133 lb 9.6 oz (60.6 kg)     GEN:  Well nourished, well developed in no acute distress HEENT: Normal NECK: No JVD; No carotid bruits LYMPHATICS: No lymphadenopathy CARDIAC: RRR, no murmurs, rubs, gallops RESPIRATORY:  Clear to auscultation without rales, wheezing or rhonchi  ABDOMEN: Soft, non-tender, non-distended MUSCULOSKELETAL:  No edema; No deformity  SKIN: Warm and dry NEUROLOGIC:  Alert and oriented x 3 PSYCHIATRIC:  Normal affect   ASSESSMENT:    1. Hypertrophic cardiomyopathy ([place])   2. Syncope, unspecified syncope type   3. Essential hypertension   4. Hyperlipidemia, unspecified hyperlipidemia type   5. Dilatation of aorta ([name])    PLAN:    Hypertrophic cardiomyopathy: Echocardiogram 04/01/2021 showed severe basal septal hypertrophy, hyperdynamic LV systolic function. Cardiac MRI on 04/03/2021 showed asymmetric LV hypertrophy measuring up to 19 mm and basal septum (11 mm and posterior wall) consistent with hypertrophic cardiomyopathy, patchy LGE at RV insertion site (LGE accounts for 2% of total myocardial mass), normal biventricular function.  Zio patch x2 weeks on 05/13/2021 showed 1 run of NSVT lasting 4 beats, Wenckebach.  Has had recent syncopal episodes -She was seen by Dr. Lovena Le in EP on 05/10/2021, suspected syncope was due to heart block (had 2-1 AV block during admission in March 2022).  Suspected she would need pacemaker, but patient declined at that time.  She is now agreeable to having pacemaker placed.  Will send message to Dr. Lovena Le. -Metoprolol discontinued, can restart if needed after PPM placement -Recommend her children (2 sons) be screened with echocardiogram, discussed this with her son  Syncope: Suspect due to heart block, follows with EP as above.  Considering PPM  Hypertension: Previously on metoprolol but discontinued due to syncope is suspected to be  due to the AV block.  Normotensive in clinic today.  Can restart beta blocker as needed following PPM  Hyperlipidemia: On Crestor 20 mg daily.  LDL 51 on 10/18/2020  Ascending aortic dilatation: Measured 40 mm on echo 03/2021.  Will monitor.  T2DM: Last A1c 6.2% 02/2021  CKD stage IIIA: Baseline creatinine ~1  RTC in 3 months   Medication  Adjustments/Labs and Tests Ordered: Current medicines are reviewed at length with the patient today.  Concerns regarding medicines are outlined above.  No orders of the defined types were placed in this encounter.  No orders of the defined types were placed in this encounter.   Patient Instructions  Medication Instructions:  Your physician recommends that you continue on your current medications as directed. Please refer to the Current Medication list given to you today.  *If you need a refill on your cardiac medications before your next appointment, please call your pharmacy*  Follow-Up: At [facility], you and your health needs are our priority.  As part of our continuing mission to provide you with exceptional heart care, we have created designated Provider Care Teams.  These Care Teams include your primary Cardiologist (physician) and Advanced Practice Providers (APPs -  Physician Assistants and Nurse Practitioners) who all work together to provide you with the care you need, when you need it.  We recommend signing up for the patient portal called "MyChart".  Sign up information is provided on this After Visit Summary.  MyChart is used to connect with patients for Virtual Visits (Telemedicine).  Patients are able to view lab/test results, encounter notes, upcoming appointments, etc.  Non-urgent messages can be sent to your provider as well.   To learn more about what you can do with MyChart, go to [email or web address].    Your next appointment:   3 month(s)  The format for your next appointment:   In Person  Provider:   You will see one  of the following Advanced Practice Providers on your designated Care Team:   Rosaria Ferries, PA-C Caron Presume, PA-C Jory Sims, DNP, ANP  6 months with Dr. Gardiner Rhyme     Signed, Donato Heinz, MD  05/16/2021 11:56 AM    [place]

## 2021-05-16 ENCOUNTER — Other Ambulatory Visit: Payer: Self-pay

## 2021-05-16 ENCOUNTER — Encounter: Payer: Self-pay | Admitting: Cardiology

## 2021-05-16 ENCOUNTER — Ambulatory Visit: Payer: Medicare Other | Admitting: Cardiology

## 2021-05-16 VITALS — BP 135/65 | HR 64 | Ht 65.0 in | Wt 138.0 lb

## 2021-05-16 DIAGNOSIS — E1122 Type 2 diabetes mellitus with diabetic chronic kidney disease: Secondary | ICD-10-CM | POA: Diagnosis not present

## 2021-05-16 DIAGNOSIS — R55 Syncope and collapse: Secondary | ICD-10-CM

## 2021-05-16 DIAGNOSIS — E785 Hyperlipidemia, unspecified: Secondary | ICD-10-CM | POA: Diagnosis not present

## 2021-05-16 DIAGNOSIS — I77819 Aortic ectasia, unspecified site: Secondary | ICD-10-CM

## 2021-05-16 DIAGNOSIS — I422 Other hypertrophic cardiomyopathy: Secondary | ICD-10-CM | POA: Diagnosis not present

## 2021-05-16 DIAGNOSIS — I421 Obstructive hypertrophic cardiomyopathy: Secondary | ICD-10-CM | POA: Diagnosis not present

## 2021-05-16 DIAGNOSIS — E041 Nontoxic single thyroid nodule: Secondary | ICD-10-CM | POA: Diagnosis not present

## 2021-05-16 DIAGNOSIS — I10 Essential (primary) hypertension: Secondary | ICD-10-CM

## 2021-05-16 DIAGNOSIS — M25511 Pain in right shoulder: Secondary | ICD-10-CM | POA: Diagnosis not present

## 2021-05-16 DIAGNOSIS — N1831 Chronic kidney disease, stage 3a: Secondary | ICD-10-CM | POA: Diagnosis not present

## 2021-05-16 DIAGNOSIS — R2689 Other abnormalities of gait and mobility: Secondary | ICD-10-CM | POA: Diagnosis not present

## 2021-05-16 DIAGNOSIS — I129 Hypertensive chronic kidney disease with stage 1 through stage 4 chronic kidney disease, or unspecified chronic kidney disease: Secondary | ICD-10-CM | POA: Diagnosis not present

## 2021-05-16 NOTE — Patient Instructions (Signed)
Medication Instructions:  Your physician recommends that you continue on your current medications as directed. Please refer to the Current Medication list given to you today.  *If you need a refill on your cardiac medications before your next appointment, please call your pharmacy*  Follow-Up: At [facility], you and your health needs are our priority.  As part of our continuing mission to provide you with exceptional heart care, we have created designated Provider Care Teams.  These Care Teams include your primary Cardiologist (physician) and Advanced Practice Providers (APPs -  Physician Assistants and Nurse Practitioners) who all work together to provide you with the care you need, when you need it.  We recommend signing up for the patient portal called "MyChart".  Sign up information is provided on this After Visit Summary.  MyChart is used to connect with patients for Virtual Visits (Telemedicine).  Patients are able to view lab/test results, encounter notes, upcoming appointments, etc.  Non-urgent messages can be sent to your provider as well.   To learn more about what you can do with MyChart, go to [email or web address].    Your next appointment:   3 month(s)  The format for your next appointment:   In Person  Provider:   You will see one of the following Advanced Practice Providers on your designated Care Team:   Rosaria Ferries, PA-C Caron Presume, PA-C Jory Sims, DNP, ANP  6 months with Dr. Gardiner Rhyme

## 2021-05-20 ENCOUNTER — Other Ambulatory Visit: Payer: Self-pay | Admitting: Internal Medicine

## 2021-05-20 DIAGNOSIS — E041 Nontoxic single thyroid nodule: Secondary | ICD-10-CM | POA: Diagnosis not present

## 2021-05-20 DIAGNOSIS — E1122 Type 2 diabetes mellitus with diabetic chronic kidney disease: Secondary | ICD-10-CM | POA: Diagnosis not present

## 2021-05-20 DIAGNOSIS — R2689 Other abnormalities of gait and mobility: Secondary | ICD-10-CM | POA: Diagnosis not present

## 2021-05-20 DIAGNOSIS — R55 Syncope and collapse: Secondary | ICD-10-CM | POA: Diagnosis not present

## 2021-05-20 DIAGNOSIS — N1831 Chronic kidney disease, stage 3a: Secondary | ICD-10-CM | POA: Diagnosis not present

## 2021-05-20 DIAGNOSIS — I129 Hypertensive chronic kidney disease with stage 1 through stage 4 chronic kidney disease, or unspecified chronic kidney disease: Secondary | ICD-10-CM | POA: Diagnosis not present

## 2021-05-20 DIAGNOSIS — I421 Obstructive hypertrophic cardiomyopathy: Secondary | ICD-10-CM | POA: Diagnosis not present

## 2021-05-21 DIAGNOSIS — M25511 Pain in right shoulder: Secondary | ICD-10-CM | POA: Diagnosis not present

## 2021-05-22 ENCOUNTER — Inpatient Hospital Stay (HOSPITAL_COMMUNITY)
Admission: EM | Admit: 2021-05-22 | Discharge: 2021-06-06 | DRG: 391 | Disposition: A | Discharge status: Skilled Nursing Facility | Payer: Medicare Other | Attending: Internal Medicine | Admitting: Internal Medicine

## 2021-05-22 ENCOUNTER — Other Ambulatory Visit: Payer: Self-pay | Admitting: Nurse Practitioner

## 2021-05-22 ENCOUNTER — Emergency Department (HOSPITAL_COMMUNITY): Payer: Medicare Other

## 2021-05-22 ENCOUNTER — Other Ambulatory Visit (HOSPITAL_COMMUNITY): Payer: Self-pay

## 2021-05-22 ENCOUNTER — Encounter (HOSPITAL_COMMUNITY): Payer: Self-pay | Admitting: Oncology

## 2021-05-22 ENCOUNTER — Other Ambulatory Visit: Payer: Medicare Other

## 2021-05-22 ENCOUNTER — Other Ambulatory Visit: Payer: Self-pay

## 2021-05-22 DIAGNOSIS — E440 Moderate protein-calorie malnutrition: Secondary | ICD-10-CM | POA: Insufficient documentation

## 2021-05-22 DIAGNOSIS — N1831 Chronic kidney disease, stage 3a: Secondary | ICD-10-CM | POA: Diagnosis not present

## 2021-05-22 DIAGNOSIS — R9431 Abnormal electrocardiogram [ECG] [EKG]: Secondary | ICD-10-CM | POA: Diagnosis not present

## 2021-05-22 DIAGNOSIS — E860 Dehydration: Secondary | ICD-10-CM | POA: Diagnosis present

## 2021-05-22 DIAGNOSIS — I700 Atherosclerosis of aorta: Secondary | ICD-10-CM | POA: Diagnosis not present

## 2021-05-22 DIAGNOSIS — Z820 Family history of epilepsy and other diseases of the nervous system: Secondary | ICD-10-CM

## 2021-05-22 DIAGNOSIS — R112 Nausea with vomiting, unspecified: Principal | ICD-10-CM | POA: Diagnosis present

## 2021-05-22 DIAGNOSIS — M47892 Other spondylosis, cervical region: Secondary | ICD-10-CM | POA: Diagnosis not present

## 2021-05-22 DIAGNOSIS — Z20822 Contact with and (suspected) exposure to covid-19: Secondary | ICD-10-CM | POA: Diagnosis not present

## 2021-05-22 DIAGNOSIS — M47812 Spondylosis without myelopathy or radiculopathy, cervical region: Secondary | ICD-10-CM | POA: Diagnosis not present

## 2021-05-22 DIAGNOSIS — N183 Chronic kidney disease, stage 3 unspecified: Secondary | ICD-10-CM | POA: Diagnosis present

## 2021-05-22 DIAGNOSIS — I959 Hypotension, unspecified: Secondary | ICD-10-CM | POA: Diagnosis present

## 2021-05-22 DIAGNOSIS — I672 Cerebral atherosclerosis: Secondary | ICD-10-CM | POA: Diagnosis present

## 2021-05-22 DIAGNOSIS — D631 Anemia in chronic kidney disease: Secondary | ICD-10-CM | POA: Diagnosis present

## 2021-05-22 DIAGNOSIS — I6621 Occlusion and stenosis of right posterior cerebral artery: Secondary | ICD-10-CM | POA: Diagnosis not present

## 2021-05-22 DIAGNOSIS — E8770 Fluid overload, unspecified: Secondary | ICD-10-CM | POA: Diagnosis not present

## 2021-05-22 DIAGNOSIS — Z8673 Personal history of transient ischemic attack (TIA), and cerebral infarction without residual deficits: Secondary | ICD-10-CM | POA: Diagnosis not present

## 2021-05-22 DIAGNOSIS — Z8042 Family history of malignant neoplasm of prostate: Secondary | ICD-10-CM

## 2021-05-22 DIAGNOSIS — G8194 Hemiplegia, unspecified affecting left nondominant side: Secondary | ICD-10-CM | POA: Diagnosis not present

## 2021-05-22 DIAGNOSIS — I441 Atrioventricular block, second degree: Secondary | ICD-10-CM | POA: Diagnosis present

## 2021-05-22 DIAGNOSIS — R7989 Other specified abnormal findings of blood chemistry: Secondary | ICD-10-CM | POA: Diagnosis present

## 2021-05-22 DIAGNOSIS — I6340 Cerebral infarction due to embolism of unspecified cerebral artery: Secondary | ICD-10-CM | POA: Diagnosis not present

## 2021-05-22 DIAGNOSIS — I129 Hypertensive chronic kidney disease with stage 1 through stage 4 chronic kidney disease, or unspecified chronic kidney disease: Secondary | ICD-10-CM | POA: Diagnosis not present

## 2021-05-22 DIAGNOSIS — I10 Essential (primary) hypertension: Secondary | ICD-10-CM | POA: Diagnosis not present

## 2021-05-22 DIAGNOSIS — I472 Ventricular tachycardia, unspecified: Secondary | ICD-10-CM | POA: Diagnosis present

## 2021-05-22 DIAGNOSIS — R6510 Systemic inflammatory response syndrome (SIRS) of non-infectious origin without acute organ dysfunction: Secondary | ICD-10-CM | POA: Diagnosis not present

## 2021-05-22 DIAGNOSIS — S0990XA Unspecified injury of head, initial encounter: Secondary | ICD-10-CM | POA: Diagnosis not present

## 2021-05-22 DIAGNOSIS — I4581 Long QT syndrome: Secondary | ICD-10-CM | POA: Diagnosis not present

## 2021-05-22 DIAGNOSIS — A419 Sepsis, unspecified organism: Secondary | ICD-10-CM

## 2021-05-22 DIAGNOSIS — W19XXXA Unspecified fall, initial encounter: Secondary | ICD-10-CM | POA: Diagnosis present

## 2021-05-22 DIAGNOSIS — M1990 Unspecified osteoarthritis, unspecified site: Secondary | ICD-10-CM | POA: Diagnosis present

## 2021-05-22 DIAGNOSIS — E782 Mixed hyperlipidemia: Secondary | ICD-10-CM | POA: Diagnosis present

## 2021-05-22 DIAGNOSIS — W19XXXD Unspecified fall, subsequent encounter: Secondary | ICD-10-CM | POA: Diagnosis not present

## 2021-05-22 DIAGNOSIS — J301 Allergic rhinitis due to pollen: Secondary | ICD-10-CM | POA: Diagnosis not present

## 2021-05-22 DIAGNOSIS — N179 Acute kidney failure, unspecified: Secondary | ICD-10-CM | POA: Diagnosis present

## 2021-05-22 DIAGNOSIS — I421 Obstructive hypertrophic cardiomyopathy: Secondary | ICD-10-CM | POA: Diagnosis present

## 2021-05-22 DIAGNOSIS — D1771 Benign lipomatous neoplasm of kidney: Secondary | ICD-10-CM | POA: Diagnosis present

## 2021-05-22 DIAGNOSIS — Z1159 Encounter for screening for other viral diseases: Secondary | ICD-10-CM | POA: Diagnosis not present

## 2021-05-22 DIAGNOSIS — Z880 Allergy status to penicillin: Secondary | ICD-10-CM

## 2021-05-22 DIAGNOSIS — Z1612 Extended spectrum beta lactamase (ESBL) resistance: Secondary | ICD-10-CM | POA: Diagnosis not present

## 2021-05-22 DIAGNOSIS — I6523 Occlusion and stenosis of bilateral carotid arteries: Secondary | ICD-10-CM | POA: Diagnosis not present

## 2021-05-22 DIAGNOSIS — S46011A Strain of muscle(s) and tendon(s) of the rotator cuff of right shoulder, initial encounter: Secondary | ICD-10-CM | POA: Diagnosis present

## 2021-05-22 DIAGNOSIS — Z7401 Bed confinement status: Secondary | ICD-10-CM | POA: Diagnosis not present

## 2021-05-22 DIAGNOSIS — I639 Cerebral infarction, unspecified: Secondary | ICD-10-CM | POA: Diagnosis not present

## 2021-05-22 DIAGNOSIS — Z808 Family history of malignant neoplasm of other organs or systems: Secondary | ICD-10-CM

## 2021-05-22 DIAGNOSIS — E1122 Type 2 diabetes mellitus with diabetic chronic kidney disease: Secondary | ICD-10-CM | POA: Diagnosis not present

## 2021-05-22 DIAGNOSIS — M25511 Pain in right shoulder: Secondary | ICD-10-CM | POA: Diagnosis not present

## 2021-05-22 DIAGNOSIS — M75111 Incomplete rotator cuff tear or rupture of right shoulder, not specified as traumatic: Secondary | ICD-10-CM | POA: Diagnosis not present

## 2021-05-22 DIAGNOSIS — R000 Tachycardia, unspecified: Secondary | ICD-10-CM | POA: Diagnosis not present

## 2021-05-22 DIAGNOSIS — R8290 Unspecified abnormal findings in urine: Secondary | ICD-10-CM

## 2021-05-22 DIAGNOSIS — M4312 Spondylolisthesis, cervical region: Secondary | ICD-10-CM | POA: Diagnosis not present

## 2021-05-22 DIAGNOSIS — N390 Urinary tract infection, site not specified: Secondary | ICD-10-CM | POA: Diagnosis not present

## 2021-05-22 DIAGNOSIS — I6529 Occlusion and stenosis of unspecified carotid artery: Secondary | ICD-10-CM | POA: Diagnosis not present

## 2021-05-22 DIAGNOSIS — I517 Cardiomegaly: Secondary | ICD-10-CM | POA: Diagnosis not present

## 2021-05-22 DIAGNOSIS — Z79899 Other long term (current) drug therapy: Secondary | ICD-10-CM

## 2021-05-22 DIAGNOSIS — R410 Disorientation, unspecified: Secondary | ICD-10-CM | POA: Diagnosis not present

## 2021-05-22 DIAGNOSIS — Z882 Allergy status to sulfonamides status: Secondary | ICD-10-CM

## 2021-05-22 DIAGNOSIS — I495 Sick sinus syndrome: Secondary | ICD-10-CM | POA: Diagnosis not present

## 2021-05-22 DIAGNOSIS — E872 Acidosis, unspecified: Secondary | ICD-10-CM | POA: Diagnosis present

## 2021-05-22 DIAGNOSIS — M4802 Spinal stenosis, cervical region: Secondary | ICD-10-CM | POA: Diagnosis not present

## 2021-05-22 DIAGNOSIS — R404 Transient alteration of awareness: Secondary | ICD-10-CM | POA: Diagnosis not present

## 2021-05-22 DIAGNOSIS — R4182 Altered mental status, unspecified: Secondary | ICD-10-CM

## 2021-05-22 DIAGNOSIS — E876 Hypokalemia: Secondary | ICD-10-CM | POA: Diagnosis not present

## 2021-05-22 DIAGNOSIS — Z800 Family history of malignant neoplasm of digestive organs: Secondary | ICD-10-CM

## 2021-05-22 DIAGNOSIS — E8779 Other fluid overload: Secondary | ICD-10-CM | POA: Diagnosis not present

## 2021-05-22 DIAGNOSIS — G9341 Metabolic encephalopathy: Secondary | ICD-10-CM | POA: Diagnosis not present

## 2021-05-22 DIAGNOSIS — M19012 Primary osteoarthritis, left shoulder: Secondary | ICD-10-CM | POA: Diagnosis not present

## 2021-05-22 DIAGNOSIS — K5730 Diverticulosis of large intestine without perforation or abscess without bleeding: Secondary | ICD-10-CM | POA: Diagnosis not present

## 2021-05-22 DIAGNOSIS — R509 Fever, unspecified: Secondary | ICD-10-CM | POA: Diagnosis not present

## 2021-05-22 DIAGNOSIS — G319 Degenerative disease of nervous system, unspecified: Secondary | ICD-10-CM | POA: Diagnosis not present

## 2021-05-22 DIAGNOSIS — R1312 Dysphagia, oropharyngeal phase: Secondary | ICD-10-CM | POA: Diagnosis present

## 2021-05-22 DIAGNOSIS — F03A0 Unspecified dementia, mild, without behavioral disturbance, psychotic disturbance, mood disturbance, and anxiety: Secondary | ICD-10-CM | POA: Diagnosis present

## 2021-05-22 DIAGNOSIS — J90 Pleural effusion, not elsewhere classified: Secondary | ICD-10-CM | POA: Diagnosis not present

## 2021-05-22 DIAGNOSIS — Z87440 Personal history of urinary (tract) infections: Secondary | ICD-10-CM

## 2021-05-22 DIAGNOSIS — J3489 Other specified disorders of nose and nasal sinuses: Secondary | ICD-10-CM | POA: Diagnosis not present

## 2021-05-22 DIAGNOSIS — Z90710 Acquired absence of both cervix and uterus: Secondary | ICD-10-CM

## 2021-05-22 DIAGNOSIS — K449 Diaphragmatic hernia without obstruction or gangrene: Secondary | ICD-10-CM | POA: Diagnosis not present

## 2021-05-22 DIAGNOSIS — M6281 Muscle weakness (generalized): Secondary | ICD-10-CM | POA: Diagnosis not present

## 2021-05-22 DIAGNOSIS — Z743 Need for continuous supervision: Secondary | ICD-10-CM | POA: Diagnosis not present

## 2021-05-22 DIAGNOSIS — R262 Difficulty in walking, not elsewhere classified: Secondary | ICD-10-CM | POA: Diagnosis present

## 2021-05-22 DIAGNOSIS — R008 Other abnormalities of heart beat: Secondary | ICD-10-CM | POA: Diagnosis present

## 2021-05-22 DIAGNOSIS — E871 Hypo-osmolality and hyponatremia: Secondary | ICD-10-CM | POA: Diagnosis present

## 2021-05-22 DIAGNOSIS — M21921 Unspecified acquired deformity of right upper arm: Secondary | ICD-10-CM | POA: Diagnosis not present

## 2021-05-22 DIAGNOSIS — Z8719 Personal history of other diseases of the digestive system: Secondary | ICD-10-CM

## 2021-05-22 DIAGNOSIS — Z9181 History of falling: Secondary | ICD-10-CM

## 2021-05-22 DIAGNOSIS — N172 Acute kidney failure with medullary necrosis: Secondary | ICD-10-CM | POA: Diagnosis not present

## 2021-05-22 LAB — COMPREHENSIVE METABOLIC PANEL
ALT: 17 U/L (ref 0–44)
AST: 19 U/L (ref 15–41)
Albumin: 5 g/dL (ref 3.5–5.0)
Alkaline Phosphatase: 72 U/L (ref 38–126)
Anion gap: 15 (ref 5–15)
BUN: 38 mg/dL — ABNORMAL HIGH (ref 8–23)
CO2: 13 mmol/L — ABNORMAL LOW (ref 22–32)
Calcium: 10.5 mg/dL — ABNORMAL HIGH (ref 8.9–10.3)
Chloride: 109 mmol/L (ref 98–111)
Creatinine, Ser: 1.86 mg/dL — ABNORMAL HIGH (ref 0.44–1.00)
GFR, Estimated: 26 mL/min — ABNORMAL LOW (ref >60)
Glucose, Bld: 150 mg/dL — ABNORMAL HIGH (ref 70–99)
Potassium: 4.4 mmol/L (ref 3.5–5.1)
Sodium: 137 mmol/L (ref 135–145)
Total Bilirubin: 0.8 mg/dL (ref 0.3–1.2)
Total Protein: 8.8 g/dL — ABNORMAL HIGH (ref 6.5–8.1)

## 2021-05-22 LAB — PROTIME-INR
INR: 1 (ref 0.8–1.2)
Prothrombin Time: 13.6 seconds (ref 11.4–15.2)

## 2021-05-22 LAB — CBC WITH DIFFERENTIAL/PLATELET
Abs Immature Granulocytes: 0.12 10*3/uL — ABNORMAL HIGH (ref 0.00–0.07)
Basophils Absolute: 0 10*3/uL (ref 0.0–0.1)
Basophils Relative: 0 %
Eosinophils Absolute: 0 10*3/uL (ref 0.0–0.5)
Eosinophils Relative: 0 %
HCT: 48.6 % — ABNORMAL HIGH (ref 36.0–46.0)
Hemoglobin: 16.1 g/dL — ABNORMAL HIGH (ref 12.0–15.0)
Immature Granulocytes: 1 %
Lymphocytes Relative: 8 %
Lymphs Abs: 1.4 10*3/uL (ref 0.7–4.0)
MCH: 29.2 pg (ref 26.0–34.0)
MCHC: 33.1 g/dL (ref 30.0–36.0)
MCV: 88.2 fL (ref 80.0–100.0)
Monocytes Absolute: 0.7 10*3/uL (ref 0.1–1.0)
Monocytes Relative: 4 %
Neutro Abs: 15.1 10*3/uL — ABNORMAL HIGH (ref 1.7–7.7)
Neutrophils Relative %: 87 %
Platelets: 391 10*3/uL (ref 150–400)
RBC: 5.51 MIL/uL — ABNORMAL HIGH (ref 3.87–5.11)
RDW: 15.8 % — ABNORMAL HIGH (ref 11.5–15.5)
WBC: 17.4 10*3/uL — ABNORMAL HIGH (ref 4.0–10.5)
nRBC: 0 % (ref 0.0–0.2)

## 2021-05-22 LAB — LIPASE, BLOOD: Lipase: 26 U/L (ref 11–51)

## 2021-05-22 LAB — APTT: aPTT: 27 seconds (ref 24–36)

## 2021-05-22 LAB — LACTIC ACID, PLASMA
Lactic Acid, Venous: 2 mmol/L (ref 0.5–1.9)
Lactic Acid, Venous: 1.7 mmol/L (ref 0.5–1.9)

## 2021-05-22 MED ORDER — LACTATED RINGERS IV SOLN
INTRAVENOUS | Status: AC
Start: 2021-05-22 — End: 2021-05-23

## 2021-05-22 MED ORDER — ACETAMINOPHEN 325 MG PO TABS
650.0000 mg | ORAL_TABLET | Freq: Four times a day (QID) | ORAL | Status: DC | PRN
  Administered 2021-05-23 – 2021-06-05 (×9): 650 mg via ORAL
  Filled 2021-05-22 (×8): qty 2

## 2021-05-22 MED ORDER — METRONIDAZOLE 500 MG/100ML IV SOLN
500.0000 mg | Freq: Once | INTRAVENOUS | Status: AC
  Administered 2021-05-22: 500 mg via INTRAVENOUS
  Filled 2021-05-22: qty 100

## 2021-05-22 MED ORDER — VANCOMYCIN HCL 1250 MG/250ML IV SOLN
1250.0000 mg | Freq: Once | INTRAVENOUS | Status: AC
  Administered 2021-05-22: 1250 mg via INTRAVENOUS
  Filled 2021-05-22: qty 250

## 2021-05-22 MED ORDER — PROCHLORPERAZINE MALEATE 10 MG PO TABS: 5.0000 mg | ORAL_TABLET | Freq: Four times a day (QID) | ORAL | Status: DC | PRN

## 2021-05-22 MED ORDER — MUSCLE RUB 10-15 % EX CREA: 1.0000 "application " | TOPICAL_CREAM | Freq: Every day | CUTANEOUS | Status: DC | PRN

## 2021-05-22 MED ORDER — PROCHLORPERAZINE MALEATE 5 MG PO TABS
5.0000 mg | ORAL_TABLET | Freq: Four times a day (QID) | ORAL | 0 refills | Status: DC | PRN
  Filled 2021-05-22: qty 30, 8d supply, fill #0

## 2021-05-22 MED ORDER — ACETAMINOPHEN 650 MG RE SUPP: 650.0000 mg | Freq: Four times a day (QID) | RECTAL | Status: DC | PRN

## 2021-05-22 MED ORDER — ROSUVASTATIN CALCIUM 20 MG PO TABS
20.0000 mg | ORAL_TABLET | Freq: Every day | ORAL | Status: DC
  Administered 2021-05-24 – 2021-06-06 (×11): 20 mg via ORAL
  Filled 2021-05-22 (×12): qty 1

## 2021-05-22 MED ORDER — SODIUM CHLORIDE 0.9% FLUSH
3.0000 mL | Freq: Two times a day (BID) | INTRAVENOUS | Status: DC
  Administered 2021-05-23 – 2021-06-06 (×23): 3 mL via INTRAVENOUS

## 2021-05-22 MED ORDER — ENOXAPARIN SODIUM 30 MG/0.3ML IJ SOSY
30.0000 mg | PREFILLED_SYRINGE | INTRAMUSCULAR | Status: DC
  Administered 2021-05-23 – 2021-05-28 (×6): 30 mg via SUBCUTANEOUS
  Filled 2021-05-22 (×6): qty 0.3

## 2021-05-22 MED ORDER — POLYETHYLENE GLYCOL 3350 17 G PO PACK: 17.0000 g | PACK | Freq: Every day | ORAL | Status: DC | PRN

## 2021-05-22 MED ORDER — POLYVINYL ALCOHOL 1.4 % OP SOLN: 1.0000 [drp] | OPHTHALMIC | Status: DC | PRN

## 2021-05-22 MED ORDER — VANCOMYCIN HCL IN DEXTROSE 1-5 GM/200ML-% IV SOLN: 1000.0000 mg | Freq: Once | INTRAVENOUS | Status: DC

## 2021-05-22 MED ORDER — PANTOPRAZOLE SODIUM 40 MG PO TBEC
40.0000 mg | DELAYED_RELEASE_TABLET | Freq: Two times a day (BID) | ORAL | Status: DC
  Administered 2021-05-23 – 2021-05-26 (×7): 40 mg via ORAL
  Filled 2021-05-22 (×8): qty 1

## 2021-05-22 MED ORDER — SODIUM CHLORIDE 0.9 % IV SOLN
2.0000 g | Freq: Once | INTRAVENOUS | Status: AC
  Administered 2021-05-22: 2 g via INTRAVENOUS
  Filled 2021-05-22: qty 2

## 2021-05-22 MED ORDER — SODIUM CHLORIDE 0.9 % IV BOLUS
1000.0000 mL | Freq: Once | INTRAVENOUS | Status: AC
  Administered 2021-05-22: 1000 mL via INTRAVENOUS

## 2021-05-22 MED ORDER — METRONIDAZOLE 500 MG/100ML IV SOLN
500.0000 mg | Freq: Two times a day (BID) | INTRAVENOUS | Status: DC
  Administered 2021-05-23 – 2021-05-24 (×3): 500 mg via INTRAVENOUS
  Filled 2021-05-22 (×3): qty 100

## 2021-05-22 NOTE — ED Notes (Addendum)
IV placed by IV team, per IV team unable to obtain any blood. Difficulty obtaining blood for 1st and 2nd set of cultures.

## 2021-05-22 NOTE — Progress Notes (Signed)
A consult was received from an ED physician for cefepime and vancomycin per pharmacy dosing.  The patient's profile has been reviewed for ht/wt/allergies/indication/available labs.   A one time order has been placed for cefepime 2g IV x1 and vancomycin 1250mg  IV x1.  Further antibiotics/pharmacy consults should be ordered by admitting physician if indicated.                       Thank you,  Dimple Nanas, PharmD 05/22/2021 9:29 PM

## 2021-05-22 NOTE — ED Notes (Signed)
Difficulty obtaining IV access.

## 2021-05-22 NOTE — ED Triage Notes (Signed)
Pt presents d/t N/V and AMS that began today.  Caregiver reports that urine specimen obtained today to look for UTI.

## 2021-05-22 NOTE — ED Provider Notes (Signed)
[facility] DEPT Provider Note   CSN: [id number] Arrival date & time: 05/22/21  1844     History Chief Complaint  Patient presents with   Emesis    Dawson Hollman is a 86 y.o. female presenting for evaluation nausea, vomiting, weakness.  Patient states she was having nausea and vomiting earlier today.  She was given medicine by her doctor, symptoms improved.  Son states patient is weaker than normal, having difficulty getting around using her walker today due to weakness.  He also reports worsening confusion over the past several weeks.  Patient and son deny fever, cough, chest pain, urinary symptoms, normal bowel movements.  Patient lives at home with the son, there is a caregiver present when son is not present. Pt states she fell last week, injured her R shoulder. She did not hit her head, denies HA.   Additional history obtained from chart review.  Patient with a history of diabetes, hypertension, stroke, hokum with plan for pacemaker placement, CKD.  HPI     Past Medical History:  Diagnosis Date   Arthritis    Diabetes mellitus without complication ([facility])    Hypertension    Hypokalemia    Pneumonia    Shingles    Stroke [facility])     Patient Active Problem List   Diagnosis Date Noted   Gait instability 04/24/2021   Seasonal allergic rhinitis due to pollen 04/24/2021   Ascending aorta dilatation ([place]) 04/05/2021   HOCM (hypertrophic obstructive cardiomyopathy) ([place]) 04/04/2021   Physical deconditioning 04/04/2021   Syncope 04/03/2021   Fall at home, initial encounter 03/31/2021   Thyroid nodule 03/31/2021   CAP (community acquired pneumonia) 11/15/2020   Dehydration 11/15/2020   Erosive esophagitis    Hiatal hernia    Hyponatremia 10/10/2020   Acute kidney injury superimposed on chronic kidney disease ([place]) 10/10/2020   Aortic atherosclerosis ([place]) 10/09/2020   Acute renal failure superimposed on stage 3a chronic kidney disease ([place])  10/09/2020   Hyperkalemia 10/09/2020   Hearing loss 10/09/2020   Prolonged QT interval 10/09/2020   Colitis, acute 08/11/2019   Colitis 07/11/2019   Loose stools 06/07/2019   Herpes zoster without complication 24/23/5361   Other long term (current) drug therapy 08/12/2018   Chronic kidney disease, stage 3a ([place]) 07/19/2018   Hypertensive nephropathy 07/19/2018   Community acquired pneumonia of right upper lobe of lung 11/07/2017   Hypertensive emergency 07/01/2016   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[place])    Benign essential HTN    Type 2 diabetes mellitus with stage 3 chronic kidney disease, without long-term current use of insulin ([place])    Mixed hyperlipidemia    ICH (intracerebral hemorrhage) ([place]) - hypertensive R thalamic hemorrhage  06/27/2016   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   Retroperitoneal bleed 02/08/2016   Generalized abdominal pain    Gastroenteritis 02/04/2016   Diarrhea 02/04/2016   Hypokalemia 02/04/2016   Incarcerated paraesophageal hernia 09/02/2014   Renal mass, left 09/02/2014   Acute esophagitis 09/02/2014   Gastric outlet obstruction 09/02/2014   Hypertension    Vomiting 09/01/2014    Past Surgical History:  Procedure Laterality Date   ABDOMINAL HYSTERECTOMY     BREAST EXCISIONAL BIOPSY Left    ESOPHAGOGASTRODUODENOSCOPY N/A 09/01/2014   Procedure: ESOPHAGOGASTRODUODENOSCOPY (EGD);  Surgeon: Lear Ng, MD;  Location: [facility];  Service: Endoscopy;  Laterality: N/A;   ESOPHAGOGASTRODUODENOSCOPY (EGD) WITH PROPOFOL N/A 10/12/2020   Procedure: ESOPHAGOGASTRODUODENOSCOPY (EGD) WITH PROPOFOL;  Surgeon: [place] Pole, MD;  Location: [facility];  Service: Endoscopy;  Laterality: N/A;   HIATAL HERNIA REPAIR N/A 09/04/2014   Procedure: LAPAROSCOPIC REPAIR OF HIATAL HERNIA;  Surgeon: Excell Seltzer, MD;  Location: [facility];  Service: General;  Laterality: N/A;  With MESH   IR GENERIC HISTORICAL   04/10/2016   IR US GUIDE VASC ACCESS RIGHT 04/10/2016 Corrie Mckusick, DO [facility]   IR GENERIC HISTORICAL  04/10/2016   IR ANGIOGRAM SELECTIVE EACH ADDITIONAL VESSEL 04/10/2016 Corrie Mckusick, DO [facility]   IR GENERIC HISTORICAL  04/10/2016   IR ANGIOGRAM SELECTIVE EACH ADDITIONAL VESSEL 04/10/2016 Corrie Mckusick, DO [facility]   IR GENERIC HISTORICAL  04/10/2016   IR EMBO TUMOR ORGAN ISCHEMIA INFARCT INC GUIDE ROADMAPPING 04/10/2016 Corrie Mckusick, DO [facility]   IR GENERIC HISTORICAL  04/10/2016   IR ANGIOGRAM SELECTIVE EACH ADDITIONAL VESSEL 04/10/2016 Corrie Mckusick, DO [facility]   IR GENERIC HISTORICAL  04/10/2016   IR ANGIOGRAM SELECTIVE EACH ADDITIONAL VESSEL 04/10/2016 Corrie Mckusick, DO [facility]   IR GENERIC HISTORICAL  04/10/2016   IR RENAL SELECTIVE  UNI INC S&I MOD SED 04/10/2016 Corrie Mckusick, DO [facility]   IR GENERIC HISTORICAL  03/06/2016   IR RADIOLOGIST EVAL & MGMT 03/06/2016 Corrie Mckusick, DO [facility]   IR GENERIC HISTORICAL  03/26/2016   IR RADIOLOGIST EVAL & MGMT 03/26/2016 Corrie Mckusick, DO [facility]   IR GENERIC HISTORICAL  04/29/2016   IR RADIOLOGIST EVAL & MGMT 04/29/2016 [facility]   IR RADIOLOGIST EVAL & MGMT  11/12/2016   IR RADIOLOGIST EVAL & MGMT  12/16/2017   KNEE SURGERY     TONSILLECTOMY       OB History   No obstetric history on file.     Family History  Problem Relation Age of Onset   Alzheimer's disease Mother    Prostate cancer Father    Brain cancer Brother    Brain cancer Brother    Pancreatic cancer Sister    Allergic rhinitis Neg Hx    Asthma Neg Hx    Eczema Neg Hx    Urticaria Neg Hx     Social History   Tobacco Use   Smoking status: Never   Smokeless tobacco: Never  Vaping Use   Vaping Use: Never used  Substance Use Topics   Alcohol use: No   Drug use: No    Home Medications Prior to Admission medications   Medication Sig Start Date End Date Taking? Authorizing Provider  acetaminophen (TYLENOL) 325 MG tablet Take  2 tablets (650 mg total) by mouth every 6 (six) hours as needed for mild pain (or Fever >/= 101). 02/08/16   Johnson, Clanford L, MD  Ascorbic Acid (VITAMIN C) 1000 MG tablet Take 1,000 mg by mouth daily.    [provider]  azelastine (ASTELIN) 0.1 % nasal spray Place 2 sprays into both nostrils daily as needed for rhinitis. 10/13/20   Hongalgi, Lenis Dickinson, MD  Carboxymethylcellul-Glycerin (LUBRICATING EYE DROPS OP) Place 1 drop into both eyes daily as needed (dry eyes).    [provider]  fexofenadine (ALLEGRA ALLERGY) 180 MG tablet Take 1 tablet (180 mg total) by mouth daily. 04/23/21   Gl[place]ale Chard, MD  Fexofenadine HCl (ALLEGRA PO) Take 180 mg by mouth at bedtime.     [provider]  glucose blood test strip Use as directed to check blood sugars 1 time per day. 03/01/21   [place]ale Chard, MD  glucose monitoring kit (FREESTYLE) monitoring kit Use as directed to check blood sugars 1 time per day dx: e11.22 03/01/21   Glendale Chard, MD  HYDROcodone-acetaminophen (NORCO/VICODIN) 5-325 MG tablet Take 1 tablet by mouth every 6 (six) hours as needed. 05/09/21   Lacretia Leigh, MD  Lancets [facility] DELICA PLUS [id number]) MISC Use to check blood sugar once daily 02/07/21     pantoprazole (PROTONIX) 40 MG tablet Give 1 tablet by mouth 2 times a day related to gastro-esophageal reflux disease without esophagitis 04/23/21   Glendale Chard, MD  prochlorperazine (COMPAZINE) 5 MG tablet Take 1 tablet (5 mg total) by mouth every 6 (six) hours as needed for nausea or vomiting. 05/22/21   Minette Brine, FNP  rosuvastatin (CRESTOR) 20 MG tablet Take 1 tablet (20 mg total) by mouth daily. 04/23/21   Glendale Chard, MD  trolamine salicylate (ASPERCREME) 10 % cream Apply 1 application topically daily as needed for muscle pain.    [provider]  vitamin B-12 (CYANOCOBALAMIN) 100 MCG tablet Take 100 mcg by mouth daily.    [provider]    Allergies    Amoxicillin, Ampicillin,  and Sulfa antibiotics  Review of Systems   Review of Systems  Gastrointestinal:  Positive for nausea and vomiting.  Neurological:  Positive for weakness.  Psychiatric/Behavioral:  Positive for confusion.   All other systems reviewed and are negative.  Physical Exam Updated Vital Signs BP (!) 193/116   Pulse (!) 112   Temp 98.9 F (37.2 C) (Rectal)   Resp 16   LMP  (LMP Unknown)   SpO2 96%   Physical Exam Vitals and nursing note reviewed.  Constitutional:      General: She is not in acute distress.    Appearance: Normal appearance. She is ill-appearing.     Comments: Appears frail  HENT:     Head: Normocephalic and atraumatic.     Mouth/Throat:     Mouth: Mucous membranes are dry.     Comments: MM dry Eyes:     Conjunctiva/sclera: Conjunctivae normal.     Pupils: Pupils are equal, round, and reactive to light.  Cardiovascular:     Rate and Rhythm: Regular rhythm. Tachycardia present.     Pulses: Normal pulses.     Comments: Tachycardic around 130 Pulmonary:     Effort: Pulmonary effort is normal. No respiratory distress.     Breath sounds: Normal breath sounds. No wheezing.     Comments: Speaking in full sentences.  Clear lung sounds in all fields. Abdominal:     General: There is no distension.     Palpations: Abdomen is soft. There is no mass.     Tenderness: There is no abdominal tenderness. There is no guarding or rebound.  Musculoskeletal:        General: Normal range of motion.     Cervical back: Normal range of motion and neck supple.  Skin:    General: Skin is warm and dry.     Capillary Refill: Capillary refill takes less than 2 seconds.  Neurological:     Mental Status: She is alert and oriented to person, place, and time.  Psychiatric:        Mood and Affect: Mood and affect normal.        Speech: Speech normal.        Behavior: Behavior normal.    ED Results / Procedures / Treatments   Labs (all labs ordered are listed, but only abnormal results  are displayed)  Labs Reviewed  LACTIC ACID, PLASMA - Abnormal; Notable for the following components:      Result Value   Lactic Acid, Venous 2.0 (*)    All other components within normal limits  COMPREHENSIVE METABOLIC PANEL - Abnormal; Notable for the following components:   CO2 13 (*)    Glucose, Bld 150 (*)    BUN 38 (*)    Creatinine, Ser 1.86 (*)    Calcium 10.5 (*)    Total Protein 8.8 (*)    GFR, Estimated 26 (*)    All other components within normal limits  CBC WITH DIFFERENTIAL/PLATELET - Abnormal; Notable for the following components:   WBC 17.4 (*)    RBC 5.51 (*)    Hemoglobin 16.1 (*)    HCT 48.6 (*)    RDW 15.8 (*)    Neutro Abs 15.1 (*)    Abs Immature Granulocytes 0.12 (*)    All other components within normal limits  URINE CULTURE  CULTURE, BLOOD (ROUTINE X 2)  CULTURE, BLOOD (ROUTINE X 2)  LACTIC ACID, PLASMA  LIPASE, BLOOD  PROTIME-INR  APTT  URINALYSIS, ROUTINE W REFLEX MICROSCOPIC    EKG EKG Interpretation  Date/Time:  Wednesday May 22 2021 19:10:59 EDT Ventricular Rate:  122 PR Interval:  102 QRS Duration: 77 QT Interval:  379 QTC Calculation: 540 R Axis:   41 Text Interpretation: Sinus or ectopic atrial tachycardia Consider right atrial enlargement Probable anteroseptal infarct, old Repol abnrm suggests ischemia, lateral leads Prolonged QT interval Baseline wander in lead(s) V4 Confirmed by Sherwood Gambler [phone]) on 05/22/2021 8:52:37 PM  Radiology CT ABDOMEN PELVIS WO CONTRAST  Result Date: 05/22/2021 CLINICAL DATA:  Nausea/vomiting, altered mental status EXAM: CT ABDOMEN AND PELVIS WITHOUT CONTRAST TECHNIQUE: Multidetector CT imaging of the abdomen and pelvis was performed following the standard protocol without IV contrast. COMPARISON:  10/09/2020 FINDINGS: Lower chest: Mild subpleural scarring in the medial right lower lobe. Hepatobiliary: Unenhanced liver is unremarkable. Gallbladder is unremarkable. No intrahepatic or extrahepatic  duct dilatation. Pancreas: Within normal limits. Spleen: Within normal limits. Adrenals/Urinary Tract: Adrenal glands are within normal limits. 2.7 cm medial left upper pole renal lesion with macroscopic fat, unchanged, compatible with a benign renal angiomyolipoma. Additional 3.1 cm left lower pole renal cyst. Right kidney is within normal limits. No hydronephrosis. Bladder is within normal limits. Stomach/Bowel: Stomach is notable for a moderate hiatal hernia. No evidence of bowel obstruction. Appendix is not discretely visualized. Sigmoid diverticulosis, without evidence of diverticulitis. Vascular/Lymphatic: No evidence of abdominal aortic aneurysm. Atherosclerotic calcifications of the abdominal aorta and branch vessels. No suspicious abdominopelvic lymphadenopathy. Reproductive: Status post hysterectomy. No adnexal masses. Other: No abdominopelvic ascites. Musculoskeletal: Grade 2 anterolisthesis of L5 on S1. Mild degenerative changes of the lower lumbar spine. IMPRESSION: Sigmoid diverticulosis, without evidence of diverticulitis. Moderate hiatal hernia. Stable left renal angiomyolipoma. Electronically Signed   By: Julian Hy M.D.   On: 05/22/2021 22:50   CT Head Wo Contrast  Result Date: 05/22/2021 CLINICAL DATA:  Head trauma EXAM: CT HEAD WITHOUT CONTRAST TECHNIQUE: Contiguous axial images were obtained from the base of the skull through the vertex without intravenous contrast. COMPARISON:  CT brain 05/09/2021, 03/31/2021 FINDINGS: Brain: No acute territorial infarction, hemorrhage or intracranial mass. Advanced atrophy. Advanced chronic small vessel ischemic changes of the white matter. Stable ventricle size. Vascular: No hyperdense vessels.  Carotid vascular calcification Skull: Normal. Negative for fracture or focal lesion. Sinuses/Orbits: No acute finding. Other: None IMPRESSION: 1. No CT evidence for acute intracranial  abnormality. 2. Atrophy and chronic small vessel ischemic changes of the  white matter Electronically Signed   By: Donavan Foil M.D.   On: 05/22/2021 19:56   DG Chest Port 1 View  Result Date: 05/22/2021 CLINICAL DATA:  Nausea vomiting EXAM: PORTABLE CHEST 1 VIEW COMPARISON:  05/09/2021 FINDINGS: The heart size and mediastinal contours are within normal limits. Aortic atherosclerosis. Both lungs are clear. The visualized skeletal structures are unremarkable. IMPRESSION: No active disease. Electronically Signed   By: Donavan Foil M.D.   On: 05/22/2021 19:57    Procedures Ultrasound ED Peripheral IV (Provider)  Date/Time: 05/22/2021 10:00 PM Performed by: Franchot Heidelberg, PA-C Authorized by: Franchot Heidelberg, PA-C   Procedure details:    Indications: multiple failed IV attempts and poor IV access     Skin Prep: chlorhexidine gluconate     Location: R proximal arm.   Angiocath:  20 G   Bedside Ultrasound Guided: Yes     Images: not archived     Patient tolerated procedure without complications: Yes     Dressing applied: Yes   .Critical Care Performed by: Franchot Heidelberg, PA-C Authorized by: Franchot Heidelberg, PA-C   Critical care provider statement:    Critical care time (minutes):  35   Critical care time was exclusive of:  Separately billable procedures and treating other patients and teaching time   Critical care was necessary to treat or prevent imminent or life-threatening deterioration of the following conditions:  Sepsis   Critical care was time spent personally by me on the following activities:  Blood draw for specimens, development of treatment plan with patient or surrogate, evaluation of patient's response to treatment, examination of patient, obtaining history from patient or surrogate, ordering and performing treatments and interventions, re-evaluation of patient's condition, pulse oximetry, ordering and review of radiographic studies, ordering and review of laboratory studies and review of old charts   I assumed direction of critical care  for this patient from another provider in my specialty: no     Care discussed with: admitting provider     Medications Ordered in ED Medications  lactated ringers infusion ( Intravenous New Bag/Given 05/22/21 2218)  vancomycin (VANCOREADY) IVPB 1250 mg/250 mL (1,250 mg Intravenous New Bag/Given 05/22/21 2219)  sodium chloride 0.9 % bolus 1,000 mL (1,000 mLs Intravenous New Bag/Given 05/22/21 2122)  ceFEPIme (MAXIPIME) 2 g in sodium chloride 0.9 % 100 mL IVPB (2 g Intravenous New Bag/Given 05/22/21 2139)  metroNIDAZOLE (FLAGYL) IVPB 500 mg (500 mg Intravenous New Bag/Given 05/22/21 2140)    ED Course  I have reviewed the triage vital signs and the nursing notes.  Pertinent labs & imaging results that were available during my care of the patient were reviewed by me and considered in my medical decision making (see chart for details).    MDM Rules/Calculators/A&P                           Patient resenting for evaluation of n/v, weakness.  On exam, patient appears frail.  She is tachycardic.  Of note, she is very hypertensive, states she has taken her BP meds and kept them down.  Due to her weakness, confusion, tachycardia, consider sepsis.  However in the setting of hypertension and fall recently, also consider intracranial pathology.  Will obtain labs, chest x-ray, urine, head CT, give fluids and reassess.  Head CT negative for acute findings.  Chest x-ray viewed and independently interpreted by me, no pneumonia,  pneumothorax,, effusion.  Labs show elevated white count at 17, lactic minimally elevated at 2.  As such, patient meet SIRS criteria and will call sepsis.  Broad-spectrum antibiotics started.  CT abdomen pelvis ordered as patient had vomiting earlier to look for source.  Urine is also still pending.  Patient is also slightly dehydrated and that her creatinine is elevated from baseline, however not double baseline.  Patient responding well to fluids, sepsis reassessment performed.   CT abdomen pelvis negative for acute findings.  Will call for admission.  Discussed with Dr. Trilby Drummer from [facility] hospitalist service, patient to be admitted  Final Clinical Impression(s) / ED Diagnoses Final diagnoses:  Sepsis, due to unspecified organism, unspecified whether acute organ dysfunction present [facility])    Rx / DC Orders ED Discharge Orders     None        Franchot Heidelberg, PA-C 05/22/21 2340    Sherwood Gambler, MD 05/27/21 0710

## 2021-05-22 NOTE — H&P (Signed)
History and Physical   Anna Hoffman ZTI:[id number] DOB: 1935/07/28 DOA: 05/22/2021  PCP: Glendale Chard, MD   Patient coming from: Home  Chief Complaint: Nausea vomiting fatigue  HPI: Anna Hoffman is a 86 y.o. female with medical history significant of esophagitis, CKD 3A, hypertension, ascending aortic dilation, colitis, hypertrophic cardiomyopathy, hypertension, intracerebral hemorrhage/CVA, prolonged QT, diabetes presenting with ongoing nausea vomiting and weakness.  Some history provided with assistance of family.  Patient has had a day or 2 of nausea and vomiting had some medication from her doctor to help some.  Son reports noting increased weakness and trouble getting around for the past day or so patient agreed with this.  Son also reports some increased confusion for the past several weeks.  Patient lives with her son but does not have any other caregivers when he fell around.  She also reportedly had a fall last week with some right shoulder injury however she not hit her head.  This occurred after the reported confusion.    She reports some increased urinary frequency. She denies fevers, chills, chest pain, shortness of breath, abdominal pain, constipation, diarrhea, nausea, vomiting.  ED Course: Vital signs in the ED significant for heart rate in the 110s to 130s and blood pressure in the 825K to 539J systolic.  Lab work-up showed CMP with bicarb 13, BUN 30, creatinine elevated to 1.86 from baseline around 1.2.  Calcium 10.5, protein 5.8.  CBC with leukocytosis 17.4 hemoglobin 6.1.  PT, PTT, INR within normal limits.  Initial lactic acid was 2 with repeat normal.  Lipase was negative.  Urinalysis, urine culture, blood cultures are pending.  Chest x-ray, CT head and CT on pelvis were negative for acute abnormalities.  CT on pelvis did demonstrate a stable left renal angiomyolipoma.  Patient received dose of Vanco, cefepime and Flagyl in the ED.  Also received 1 L IV fluid bolus and  was placed on a rate of 150 cc.  Review of Systems: As per HPI otherwise all other systems reviewed and are negative.  Past Medical History:  Diagnosis Date   Arthritis    Diabetes mellitus without complication ([place])    Hypertension    Hypokalemia    Pneumonia    Shingles    Stroke [facility])     Past Surgical History:  Procedure Laterality Date   ABDOMINAL HYSTERECTOMY     BREAST EXCISIONAL BIOPSY Left    ESOPHAGOGASTRODUODENOSCOPY N/A 09/01/2014   Procedure: ESOPHAGOGASTRODUODENOSCOPY (EGD);  Surgeon: Lear Ng, MD;  Location: [facility];  Service: Endoscopy;  Laterality: N/A;   ESOPHAGOGASTRODUODENOSCOPY (EGD) WITH PROPOFOL N/A 10/12/2020   Procedure: ESOPHAGOGASTRODUODENOSCOPY (EGD) WITH PROPOFOL;  Surgeon: Mauri Pole, MD;  Location: [facility];  Service: Endoscopy;  Laterality: N/A;   HIATAL HERNIA REPAIR N/A 09/04/2014   Procedure: LAPAROSCOPIC REPAIR OF HIATAL HERNIA;  Surgeon: Excell Seltzer, MD;  Location: [facility];  Service: General;  Laterality: N/A;  With MESH   IR GENERIC HISTORICAL  04/10/2016   IR US GUIDE VASC ACCESS RIGHT 04/10/2016 Corrie Mckusick, DO [facility]   IR GENERIC HISTORICAL  04/10/2016   IR ANGIOGRAM SELECTIVE EACH ADDITIONAL VESSEL 04/10/2016 Corrie Mckusick, DO [facility]   IR GENERIC HISTORICAL  04/10/2016   IR ANGIOGRAM SELECTIVE EACH ADDITIONAL VESSEL 04/10/2016 Corrie Mckusick, DO [facility]   IR GENERIC HISTORICAL  04/10/2016   IR EMBO TUMOR ORGAN ISCHEMIA INFARCT INC GUIDE ROADMAPPING 04/10/2016 Corrie Mckusick, DO [facility]   IR GENERIC HISTORICAL  04/10/2016  IR ANGIOGRAM SELECTIVE EACH ADDITIONAL VESSEL 04/10/2016 Corrie Mckusick, DO [facility]   IR GENERIC HISTORICAL  04/10/2016   IR ANGIOGRAM SELECTIVE EACH ADDITIONAL VESSEL 04/10/2016 Corrie Mckusick, DO [facility]   IR GENERIC HISTORICAL  04/10/2016   IR RENAL SELECTIVE  UNI INC S&I MOD SED 04/10/2016 Corrie Mckusick, DO [facility]   IR GENERIC HISTORICAL  03/06/2016   IR RADIOLOGIST EVAL & MGMT  03/06/2016 Corrie Mckusick, DO [facility]   IR GENERIC HISTORICAL  03/26/2016   IR RADIOLOGIST EVAL & MGMT 03/26/2016 Corrie Mckusick, DO [facility]   IR GENERIC HISTORICAL  04/29/2016   IR RADIOLOGIST EVAL & MGMT 04/29/2016 [facility]   IR RADIOLOGIST EVAL & MGMT  11/12/2016   IR RADIOLOGIST EVAL & MGMT  12/16/2017   KNEE SURGERY     TONSILLECTOMY      Social History  reports that she has never smoked. She has never used smokeless tobacco. She reports that she does not drink alcohol and does not use drugs.  Allergies  Allergen Reactions   Amoxicillin Diarrhea    Has patient had a PCN reaction causing immediate rash, facial/tongue/throat swelling, SOB or lightheadedness with hypotension: no Has patient had a PCN reaction causing severe rash involving mucus membranes or skin necrosis: no Has patient had a PCN reaction that required hospitalization: no pharmacist consult Has patient had a PCN reaction occurring within the last 10 years: yes If all of the above answers are "NO", then may proceed with Cephalosporin use.    Ampicillin Rash    - Tolerates Rocephin and Ancef - Remote occurrence; no symptoms of anaphylaxis or severe cutaneous reaction, and no additional medical attention required     Sulfa Antibiotics Rash    Family History  Problem Relation Age of Onset   Alzheimer's disease Mother    Prostate cancer Father    Brain cancer Brother    Brain cancer Brother    Pancreatic cancer Sister    Allergic rhinitis Neg Hx    Asthma Neg Hx    Eczema Neg Hx    Urticaria Neg Hx   Reviewed on admission  Prior to Admission medications   Medication Sig Start Date End Date Taking? Authorizing Provider  acetaminophen (TYLENOL) 325 MG tablet Take 2 tablets (650 mg total) by mouth every 6 (six) hours as needed for mild pain (or Fever >/= 101). 02/08/16   Johnson, Clanford L, MD  Ascorbic Acid (VITAMIN C) 1000 MG tablet Take 1,000 mg by mouth daily.    [provider]   azelastine (ASTELIN) 0.1 % nasal spray Place 2 sprays into both nostrils daily as needed for rhinitis. 10/13/20   Hongalgi, Lenis Dickinson, MD  Carboxymethylcellul-Glycerin (LUBRICATING EYE DROPS OP) Place 1 drop into both eyes daily as needed (dry eyes).    [provider]  fexofenadine (ALLEGRA ALLERGY) 180 MG tablet Take 1 tablet (180 mg total) by mouth daily. 04/23/21   Glendale Chard, MD  Fexofenadine HCl (ALLEGRA PO) Take 180 mg by mouth at bedtime.     [provider]  glucose blood test strip Use as directed to check blood sugars 1 time per day. 03/01/21   Glendale Chard, MD  glucose monitoring kit (FREESTYLE) monitoring kit Use as directed to check blood sugars 1 time per day dx: e11.22 03/01/21   Glendale Chard, MD  HYDROcodone-acetaminophen (NORCO/VICODIN) 5-325 MG tablet Take 1 tablet by mouth every 6 (six) hours as needed. 05/09/21   Lacretia Leigh, MD  Lancets (ONETOUCH DELICA PLUS EFEOFH21F) MISC Use to check blood sugar once daily 02/07/21     pantoprazole (PROTONIX) 40 MG tablet Give 1 tablet by mouth 2 times a day related to gastro-esophageal reflux disease without esophagitis 04/23/21   Glendale Chard, MD  prochlorperazine (COMPAZINE) 5 MG tablet Take 1 tablet (5 mg total) by mouth every 6 (six) hours as needed for nausea or vomiting. 05/22/21   Minette Brine, FNP  rosuvastatin (CRESTOR) 20 MG tablet Take 1 tablet (20 mg total) by mouth daily. 04/23/21   Glendale Chard, MD  trolamine salicylate (ASPERCREME) 10 % cream Apply 1 application topically daily as needed for muscle pain.    [provider]  vitamin B-12 (CYANOCOBALAMIN) 100 MCG tablet Take 100 mcg by mouth daily.    [provider]    Physical Exam: Vitals:   05/22/21 2126 05/22/21 2200 05/22/21 2230 05/22/21 2330  BP: (!) 208/134 (!) 209/117 (!) 183/119 (!) 193/116  Pulse: (!) 116 (!) 113 (!) 114 (!) 112  Resp: 20 20 (!) 21 16  Temp: 98.9 F (37.2 C)     TempSrc: Rectal     SpO2: 94% 95% 97%  96%   Physical Exam Constitutional:      General: She is not in acute distress.    Appearance: Normal appearance.  HENT:     Head: Normocephalic and atraumatic.     Mouth/Throat:     Mouth: Mucous membranes are moist.     Pharynx: Oropharynx is clear.  Eyes:     Extraocular Movements: Extraocular movements intact.     Pupils: Pupils are equal, round, and reactive to light.  Cardiovascular:     Rate and Rhythm: Regular rhythm. Tachycardia present.     Pulses: Normal pulses.     Heart sounds: Normal heart sounds.  Pulmonary:     Effort: Pulmonary effort is normal. No respiratory distress.     Breath sounds: Normal breath sounds.  Abdominal:     General: Bowel sounds are normal. There is no distension.     Palpations: Abdomen is soft.     Tenderness: There is no abdominal tenderness.  Musculoskeletal:        General: No swelling or deformity.  Skin:    General: Skin is warm and dry.  Neurological:     General: No focal deficit present.     Mental Status: Mental status is at baseline.   Labs on Admission: I have personally reviewed following labs and imaging studies  CBC: Recent Labs  Lab 05/22/21 2030  WBC 17.4*  NEUTROABS 15.1*  HGB 16.1*  HCT 48.6*  MCV 88.2  PLT 758    Basic Metabolic Panel: Recent Labs  Lab 05/22/21 2030  NA 137  K 4.4  CL 109  CO2 13*  GLUCOSE 150*  BUN 38*  CREATININE 1.86*  CALCIUM 10.5*    GFR: Estimated Creatinine Clearance: 20.3 mL/min (A) (by C-G formula based on SCr of 1.86 mg/dL (H)).  Liver Function Tests: Recent Labs  Lab 05/22/21 2030  AST 19  ALT 17  ALKPHOS 72  BILITOT 0.8  PROT 8.8*  ALBUMIN 5.0    Urine analysis:    Component Value Date/Time   COLORURINE YELLOW 03/31/2021 1800   APPEARANCEUR CLEAR 03/31/2021 1800   LABSPEC 1.009 03/31/2021 1800   PHURINE 7.0 03/31/2021 1800   GLUCOSEU NEGATIVE 03/31/2021 1800   HGBUR NEGATIVE 03/31/2021 1800   BILIRUBINUR NEGATIVE 03/31/2021 1800   [place]  Negative 11/13/2020 0738  KETONESUR NEGATIVE 03/31/2021 1800   PROTEINUR NEGATIVE 03/31/2021 1800   UROBILINOGEN 0.2 11/13/2020 0738   UROBILINOGEN 0.2 09/01/2014 0300   NITRITE NEGATIVE 03/31/2021 1800   LEUKOCYTESUR MODERATE (A) 03/31/2021 1800    Radiological Exams on Admission: CT ABDOMEN PELVIS WO CONTRAST  Result Date: 05/22/2021 CLINICAL DATA:  Nausea/vomiting, altered mental status EXAM: CT ABDOMEN AND PELVIS WITHOUT CONTRAST TECHNIQUE: Multidetector CT imaging of the abdomen and pelvis was performed following the standard protocol without IV contrast. COMPARISON:  10/09/2020 FINDINGS: Lower chest: Mild subpleural scarring in the medial right lower lobe. Hepatobiliary: Unenhanced liver is unremarkable. Gallbladder is unremarkable. No intrahepatic or extrahepatic duct dilatation. Pancreas: Within normal limits. Spleen: Within normal limits. Adrenals/Urinary Tract: Adrenal glands are within normal limits. 2.7 cm medial left upper pole renal lesion with macroscopic fat, unchanged, compatible with a benign renal angiomyolipoma. Additional 3.1 cm left lower pole renal cyst. Right kidney is within normal limits. No hydronephrosis. Bladder is within normal limits. Stomach/Bowel: Stomach is notable for a moderate hiatal hernia. No evidence of bowel obstruction. Appendix is not discretely visualized. Sigmoid diverticulosis, without evidence of diverticulitis. Vascular/Lymphatic: No evidence of abdominal aortic aneurysm. Atherosclerotic calcifications of the abdominal aorta and branch vessels. No suspicious abdominopelvic lymphadenopathy. Reproductive: Status post hysterectomy. No adnexal masses. Other: No abdominopelvic ascites. Musculoskeletal: Grade 2 anterolisthesis of L5 on S1. Mild degenerative changes of the lower lumbar spine. IMPRESSION: Sigmoid diverticulosis, without evidence of diverticulitis. Moderate hiatal hernia. Stable left renal angiomyolipoma. Electronically Signed   By: Julian Hy M.D.   On: 05/22/2021 22:50   CT Head Wo Contrast  Result Date: 05/22/2021 CLINICAL DATA:  Head trauma EXAM: CT HEAD WITHOUT CONTRAST TECHNIQUE: Contiguous axial images were obtained from the base of the skull through the vertex without intravenous contrast. COMPARISON:  CT brain 05/09/2021, 03/31/2021 FINDINGS: Brain: No acute territorial infarction, hemorrhage or intracranial mass. Advanced atrophy. Advanced chronic small vessel ischemic changes of the white matter. Stable ventricle size. Vascular: No hyperdense vessels.  Carotid vascular calcification Skull: Normal. Negative for fracture or focal lesion. Sinuses/Orbits: No acute finding. Other: None IMPRESSION: 1. No CT evidence for acute intracranial abnormality. 2. Atrophy and chronic small vessel ischemic changes of the white matter Electronically Signed   By: Donavan Foil M.D.   On: 05/22/2021 19:56   DG Chest Port 1 View  Result Date: 05/22/2021 CLINICAL DATA:  Nausea vomiting EXAM: PORTABLE CHEST 1 VIEW COMPARISON:  05/09/2021 FINDINGS: The heart size and mediastinal contours are within normal limits. Aortic atherosclerosis. Both lungs are clear. The visualized skeletal structures are unremarkable. IMPRESSION: No active disease. Electronically Signed   By: Donavan Foil M.D.   On: 05/22/2021 19:57    EKG: Independently reviewed.  Sinus tachycardia versus ectopic atrial pressure.  With some baseline artifact.  And some baseline wander.  Prolonged QT at 540.  Some nonspecific T wave abnormalities.  Assessment/Plan Principal Problem:   SIRS (systemic inflammatory response syndrome) ([name]) Active Problems:   Benign essential HTN   Type 2 diabetes mellitus with stage 3 chronic kidney disease, without long-term current use of insulin ([name])   Mixed hyperlipidemia   Acute renal failure superimposed on stage 3a chronic kidney disease ([name])   Prolonged QT interval   Dehydration   HOCM (hypertrophic obstructive cardiomyopathy) ([name])    History of CVA (cerebrovascular accident)  Sepsis SIRS > Meets SIRS criteria with tachycardia and leukocytosis to 17.4.  Possibly sepsis with unknown source.  Versus leukocytosis secondary to hemoconcentration as below given AKI. >  Has had on a cardiologist going nausea and vomiting however CT down pelvis did not show any acute abnormalities.  Also with some weakness and trouble getting around recently.  And reportedly some confusion for a few weeks. > Noted to have tachycardia in the low 110s to 130s, hypotension in the ED.  Also with leukocytosis to 17.4.  Initial lactic acid 2 which returned normal on repeat. > Received 1 L IV fluids and started on a rate in the ED.  Also received a dose of vancomycin, cefepime, Flagyl. - Monitor on progressive unit for now - Continue with IV fluids - Plan to narrow antibiotics to cefepime and Flagyl and check MRSA PCR screen while we await culture results. - Follow-up urinalysis, urine culture, blood culture - Trend fever curve and white count  AKI on CKD 3 > Creatinine elevated to 1.6 in the ED from baseline around 1.2.  In setting of recent nausea vomiting. > Received 1 L IV fluids in the ED and started on heart rate - Continue IV fluids at heart 50 cc/h - Avoid nephrotoxic agents - Trend renal function electrolytes  Hypertrophic cardiomyopathy Intermittent second degree heart block > Known hypertrophic cardiomyopathy last echo with EF 38-25% with G1 diastolic dysfunction and focal findings. > History of syncope believed to be due to intermittent second-degree block.  Plan for pacemaker in the near future. - Avoid dehydration - Avoid nodal blocking agents  Hypertension > Significantly hypertensive in the ED to the 053Z to 767H systolic.  No history at this level with severe hypertension per chart review.  Had been on metoprolol only before and had had blood pressure only in the 130s while off this medication.  Avoiding nodal blocking agents in the  setting of her second-degree heart block as above. > We will avoid nodal blocking agents as above. - As needed hydralazine for blood pressure greater than 419 systolic  Prolonged QT > In the ED QTC prolonged to 540 - Avoid QT prolonging medications - A.m. EKG - Check magnesium  Diabetes - SSI   Hyperlipidemia - Continue home statin  DVT prophylaxis: Lovenox  Code Status:   Full  Family Communication:  Son updated by phone  Disposition Plan:   Patient is from:  Home  Anticipated DC to:  Home  Anticipated DC date:  1 to 3 days  Anticipated DC barriers: None  Consults called:  None Admission status:  Observation, progressive  Severity of Illness: The appropriate patient status for this patient is OBSERVATION. Observation status is judged to be reasonable and necessary in order to provide the required intensity of service to ensure the patient's safety. The patient's presenting symptoms, physical exam findings, and initial radiographic and laboratory data in the context of their medical condition is felt to place them at decreased risk for further clinical deterioration. Furthermore, it is anticipated that the patient will be medically stable for discharge from the hospital within 2 midnights of admission.     Marcelyn Bruins MD [facility]  How to contact the [facility] Attending or Consulting provider [place] or covering provider during after hours [place], for this patient?   Check the care team in [facility] and look for a) attending/consulting [facility] provider listed and b) the [facility] team listed Log into [email or web address] and use [place]'s universal password to access. If you do not have the password, please contact the hospital operator. Locate the [facility] provider you are looking for under [facility] and page to a  number that you can be directly reached. If you still have difficulty reaching the provider, please page the [facility] (Director on Call) for the Hospitalists listed on amion  for assistance.  05/23/2021, 12:06 AM

## 2021-05-22 NOTE — ED Notes (Signed)
Call received from son Thayer [place] [phone] requesting rtn call for pt status/updates. [employer]

## 2021-05-23 ENCOUNTER — Telehealth: Payer: Self-pay

## 2021-05-23 DIAGNOSIS — E440 Moderate protein-calorie malnutrition: Secondary | ICD-10-CM | POA: Diagnosis not present

## 2021-05-23 DIAGNOSIS — I6621 Occlusion and stenosis of right posterior cerebral artery: Secondary | ICD-10-CM | POA: Diagnosis not present

## 2021-05-23 DIAGNOSIS — I472 Ventricular tachycardia, unspecified: Secondary | ICD-10-CM | POA: Diagnosis present

## 2021-05-23 DIAGNOSIS — E872 Acidosis, unspecified: Secondary | ICD-10-CM | POA: Diagnosis present

## 2021-05-23 DIAGNOSIS — R262 Difficulty in walking, not elsewhere classified: Secondary | ICD-10-CM | POA: Diagnosis present

## 2021-05-23 DIAGNOSIS — I495 Sick sinus syndrome: Secondary | ICD-10-CM | POA: Diagnosis not present

## 2021-05-23 DIAGNOSIS — R6510 Systemic inflammatory response syndrome (SIRS) of non-infectious origin without acute organ dysfunction: Secondary | ICD-10-CM | POA: Diagnosis not present

## 2021-05-23 DIAGNOSIS — R9431 Abnormal electrocardiogram [ECG] [EKG]: Secondary | ICD-10-CM | POA: Diagnosis not present

## 2021-05-23 DIAGNOSIS — I6340 Cerebral infarction due to embolism of unspecified cerebral artery: Secondary | ICD-10-CM | POA: Diagnosis not present

## 2021-05-23 DIAGNOSIS — I441 Atrioventricular block, second degree: Secondary | ICD-10-CM | POA: Diagnosis present

## 2021-05-23 DIAGNOSIS — E860 Dehydration: Secondary | ICD-10-CM | POA: Diagnosis present

## 2021-05-23 DIAGNOSIS — I959 Hypotension, unspecified: Secondary | ICD-10-CM | POA: Diagnosis present

## 2021-05-23 DIAGNOSIS — Z1612 Extended spectrum beta lactamase (ESBL) resistance: Secondary | ICD-10-CM | POA: Diagnosis not present

## 2021-05-23 DIAGNOSIS — W19XXXA Unspecified fall, initial encounter: Secondary | ICD-10-CM | POA: Diagnosis present

## 2021-05-23 DIAGNOSIS — M4312 Spondylolisthesis, cervical region: Secondary | ICD-10-CM | POA: Diagnosis not present

## 2021-05-23 DIAGNOSIS — G9341 Metabolic encephalopathy: Secondary | ICD-10-CM | POA: Diagnosis not present

## 2021-05-23 DIAGNOSIS — Z8673 Personal history of transient ischemic attack (TIA), and cerebral infarction without residual deficits: Secondary | ICD-10-CM | POA: Diagnosis not present

## 2021-05-23 DIAGNOSIS — A419 Sepsis, unspecified organism: Secondary | ICD-10-CM | POA: Diagnosis present

## 2021-05-23 DIAGNOSIS — R4182 Altered mental status, unspecified: Secondary | ICD-10-CM | POA: Diagnosis not present

## 2021-05-23 DIAGNOSIS — J3489 Other specified disorders of nose and nasal sinuses: Secondary | ICD-10-CM | POA: Diagnosis not present

## 2021-05-23 DIAGNOSIS — R7989 Other specified abnormal findings of blood chemistry: Secondary | ICD-10-CM | POA: Diagnosis present

## 2021-05-23 DIAGNOSIS — E1122 Type 2 diabetes mellitus with diabetic chronic kidney disease: Secondary | ICD-10-CM | POA: Diagnosis not present

## 2021-05-23 DIAGNOSIS — I6523 Occlusion and stenosis of bilateral carotid arteries: Secondary | ICD-10-CM | POA: Diagnosis not present

## 2021-05-23 DIAGNOSIS — M47812 Spondylosis without myelopathy or radiculopathy, cervical region: Secondary | ICD-10-CM | POA: Diagnosis not present

## 2021-05-23 DIAGNOSIS — G319 Degenerative disease of nervous system, unspecified: Secondary | ICD-10-CM | POA: Diagnosis not present

## 2021-05-23 DIAGNOSIS — R112 Nausea with vomiting, unspecified: Secondary | ICD-10-CM | POA: Diagnosis present

## 2021-05-23 DIAGNOSIS — N179 Acute kidney failure, unspecified: Secondary | ICD-10-CM | POA: Diagnosis not present

## 2021-05-23 DIAGNOSIS — N172 Acute kidney failure with medullary necrosis: Secondary | ICD-10-CM | POA: Diagnosis not present

## 2021-05-23 DIAGNOSIS — N183 Chronic kidney disease, stage 3 unspecified: Secondary | ICD-10-CM | POA: Diagnosis not present

## 2021-05-23 DIAGNOSIS — E782 Mixed hyperlipidemia: Secondary | ICD-10-CM | POA: Diagnosis present

## 2021-05-23 DIAGNOSIS — Z20822 Contact with and (suspected) exposure to covid-19: Secondary | ICD-10-CM | POA: Diagnosis present

## 2021-05-23 DIAGNOSIS — G8194 Hemiplegia, unspecified affecting left nondominant side: Secondary | ICD-10-CM | POA: Diagnosis not present

## 2021-05-23 DIAGNOSIS — I10 Essential (primary) hypertension: Secondary | ICD-10-CM | POA: Diagnosis not present

## 2021-05-23 DIAGNOSIS — E871 Hypo-osmolality and hyponatremia: Secondary | ICD-10-CM | POA: Diagnosis present

## 2021-05-23 DIAGNOSIS — I129 Hypertensive chronic kidney disease with stage 1 through stage 4 chronic kidney disease, or unspecified chronic kidney disease: Secondary | ICD-10-CM | POA: Diagnosis present

## 2021-05-23 DIAGNOSIS — N1831 Chronic kidney disease, stage 3a: Secondary | ICD-10-CM | POA: Diagnosis not present

## 2021-05-23 DIAGNOSIS — D631 Anemia in chronic kidney disease: Secondary | ICD-10-CM | POA: Diagnosis present

## 2021-05-23 DIAGNOSIS — I421 Obstructive hypertrophic cardiomyopathy: Secondary | ICD-10-CM | POA: Diagnosis not present

## 2021-05-23 DIAGNOSIS — N390 Urinary tract infection, site not specified: Secondary | ICD-10-CM | POA: Diagnosis not present

## 2021-05-23 DIAGNOSIS — I639 Cerebral infarction, unspecified: Secondary | ICD-10-CM | POA: Diagnosis not present

## 2021-05-23 LAB — COMPREHENSIVE METABOLIC PANEL
ALT: 13 U/L (ref 0–44)
AST: 17 U/L (ref 15–41)
Albumin: 3.8 g/dL (ref 3.5–5.0)
Alkaline Phosphatase: 54 U/L (ref 38–126)
Anion gap: 11 (ref 5–15)
BUN: 33 mg/dL — ABNORMAL HIGH (ref 8–23)
CO2: 13 mmol/L — ABNORMAL LOW (ref 22–32)
Calcium: 9.1 mg/dL (ref 8.9–10.3)
Chloride: 108 mmol/L (ref 98–111)
Creatinine, Ser: 1.7 mg/dL — ABNORMAL HIGH (ref 0.44–1.00)
GFR, Estimated: 29 mL/min — ABNORMAL LOW (ref >60)
Glucose, Bld: 121 mg/dL — ABNORMAL HIGH (ref 70–99)
Potassium: 3.9 mmol/L (ref 3.5–5.1)
Sodium: 132 mmol/L — ABNORMAL LOW (ref 135–145)
Total Bilirubin: 0.8 mg/dL (ref 0.3–1.2)
Total Protein: 6.9 g/dL (ref 6.5–8.1)

## 2021-05-23 LAB — URINALYSIS, ROUTINE W REFLEX MICROSCOPIC
Bilirubin Urine: NEGATIVE
Glucose, UA: NEGATIVE mg/dL
Ketones, ur: 5 mg/dL — AB
Leukocytes,Ua: NEGATIVE
Nitrite: NEGATIVE
Protein, ur: 100 mg/dL — AB
Specific Gravity, Urine: 1.018 (ref 1.005–1.030)
pH: 5 (ref 5.0–8.0)

## 2021-05-23 LAB — RESP PANEL BY RT-PCR (FLU A&B, COVID) ARPGX2
Influenza A by PCR: NEGATIVE
Influenza B by PCR: NEGATIVE
SARS Coronavirus 2 by RT PCR: NEGATIVE

## 2021-05-23 LAB — PROTIME-INR
INR: 1.1 (ref 0.8–1.2)
Prothrombin Time: 14.3 seconds (ref 11.4–15.2)

## 2021-05-23 LAB — GLUCOSE, CAPILLARY
Glucose-Capillary: 106 mg/dL — ABNORMAL HIGH (ref 70–99)
Glucose-Capillary: 138 mg/dL — ABNORMAL HIGH (ref 70–99)
Glucose-Capillary: 95 mg/dL (ref 70–99)
Glucose-Capillary: 111 mg/dL — ABNORMAL HIGH (ref 70–99)

## 2021-05-23 LAB — CBC
HCT: 39.5 % (ref 36.0–46.0)
Hemoglobin: 12.7 g/dL (ref 12.0–15.0)
MCH: 29.1 pg (ref 26.0–34.0)
MCHC: 32.2 g/dL (ref 30.0–36.0)
MCV: 90.6 fL (ref 80.0–100.0)
Platelets: 316 10*3/uL (ref 150–400)
RBC: 4.36 MIL/uL (ref 3.87–5.11)
RDW: 15.7 % — ABNORMAL HIGH (ref 11.5–15.5)
WBC: 14.6 10*3/uL — ABNORMAL HIGH (ref 4.0–10.5)
nRBC: 0 % (ref 0.0–0.2)

## 2021-05-23 LAB — MAGNESIUM: Magnesium: 2.5 mg/dL — ABNORMAL HIGH (ref 1.7–2.4)

## 2021-05-23 LAB — SURGICAL PCR SCREEN
MRSA, PCR: NEGATIVE
Staphylococcus aureus: POSITIVE — AB

## 2021-05-23 MED ORDER — CHLORHEXIDINE GLUCONATE CLOTH 2 % EX PADS
6.0000 | MEDICATED_PAD | Freq: Every day | CUTANEOUS | Status: DC
  Administered 2021-05-23: 6 via TOPICAL

## 2021-05-23 MED ORDER — ENSURE ENLIVE PO LIQD
237.0000 mL | Freq: Two times a day (BID) | ORAL | Status: DC
  Administered 2021-05-23 – 2021-05-24 (×2): 237 mL via ORAL

## 2021-05-23 MED ORDER — HYDRALAZINE HCL 20 MG/ML IJ SOLN
2.0000 mg | Freq: Four times a day (QID) | INTRAMUSCULAR | Status: DC | PRN
  Administered 2021-05-23: 2 mg via INTRAVENOUS
  Filled 2021-05-23: qty 1

## 2021-05-23 MED ORDER — INSULIN ASPART 100 UNIT/ML IJ SOLN
0.0000 [IU] | Freq: Three times a day (TID) | INTRAMUSCULAR | Status: DC
  Administered 2021-05-23 – 2021-05-26 (×4): 1 [IU] via SUBCUTANEOUS
  Administered 2021-05-27 (×2): 2 [IU] via SUBCUTANEOUS
  Administered 2021-05-28 – 2021-06-02 (×9): 1 [IU] via SUBCUTANEOUS
  Administered 2021-06-02: 2 [IU] via SUBCUTANEOUS
  Administered 2021-06-03 – 2021-06-06 (×9): 1 [IU] via SUBCUTANEOUS
  Filled 2021-05-23: qty 0.09

## 2021-05-23 MED ORDER — MUPIROCIN 2 % EX OINT
1.0000 | TOPICAL_OINTMENT | Freq: Two times a day (BID) | CUTANEOUS | Status: AC
Start: 2021-05-23 — End: 2021-05-27
  Administered 2021-05-23 – 2021-05-27 (×10): 1 via NASAL
  Filled 2021-05-23 (×4): qty 22

## 2021-05-23 MED ORDER — DICLOFENAC SODIUM 1 % EX GEL
2.0000 g | Freq: Four times a day (QID) | CUTANEOUS | Status: DC
  Administered 2021-05-23 – 2021-06-06 (×56): 2 g via TOPICAL
  Filled 2021-05-23 (×2): qty 100

## 2021-05-23 MED ORDER — HYDRALAZINE HCL 20 MG/ML IJ SOLN: 5.0000 mg | Freq: Four times a day (QID) | INTRAMUSCULAR | Status: DC | PRN

## 2021-05-23 MED ORDER — SODIUM CHLORIDE 0.9 % IV SOLN
2.0000 g | INTRAVENOUS | Status: DC
  Administered 2021-05-23 – 2021-05-25 (×3): 2 g via INTRAVENOUS
  Filled 2021-05-23 (×3): qty 2

## 2021-05-23 NOTE — TOC Initial Note (Signed)
Transition of Care [facility]) - Initial/Assessment Note    Patient Details  Name: Anna Hoffman MRN: [id number] Date of Birth: 09/18/1934  Transition of Care [facility]) CM/SW Contact:    Dessa Phi, RN Phone Number: 05/23/2021, 3:15 PM  Clinical Narrative:  Spoke to both sons Adrian [place] custodial level care at [facility] -HHPT/OT for R shoulder exercises but unsure of agency-James will confirm tomorrow.  Agree to ST SNF if needed.PT/OT eval placed.               Expected Discharge Plan:  (TBD) Barriers to Discharge: Continued Medical Work up   Patient Goals and CMS Choice Patient states their goals for this hospitalization and ongoing recovery are:: Per James/Steven sons may need ST SNF CMS Medicare.gov Compare Post Acute Care list provided to:: Patient Represenative (must comment) Choice offered to / list presented to : Adult Children  Expected Discharge Plan and Services Expected Discharge Plan:  (TBD)   Discharge Planning Services: CM Consult Post Acute Care Choice: [place] Living arrangements for the past 2 months: Single Family Home                                      Prior Living Arrangements/Services Living arrangements for the past 2 months: Single Family Home Lives with:: Adult Children Patient language and need for interpreter reviewed:: Yes Do you feel safe going back to the place where you live?: Yes      Need for Family Participation in Patient Care: No (Comment) Care giver support system in place?: Yes (comment) Current home services: DME, Home PT, Home OT, Other (comment) (rw,3n1,cane;private duty care-custodial level 2x week.Has HHPT/OT but unsure of agency.) Criminal Activity/Legal Involvement Pertinent to Current Situation/Hospitalization: No - Comment as needed  Activities of Daily Living Home Assistive Devices/Equipment: Eyeglasses, CBG Meter, Walker (specify type) ADL Screening (condition at time of admission) Patient's  cognitive ability adequate to safely complete daily activities?: Yes Is the patient deaf or have difficulty hearing?: Yes Does the patient have difficulty seeing, even when wearing glasses/contacts?: No Does the patient have difficulty concentrating, remembering, or making decisions?: Yes Patient able to express need for assistance with ADLs?: Yes Does the patient have difficulty dressing or bathing?: Yes Independently performs ADLs?: No Communication: Independent Dressing (OT): Needs assistance Is this a change from baseline?: Pre-admission baseline Grooming: Needs assistance Is this a change from baseline?: Pre-admission baseline Feeding: Needs assistance Is this a change from baseline?: Pre-admission baseline Bathing: Needs assistance Is this a change from baseline?: Pre-admission baseline Toileting: Needs assistance Is this a change from baseline?: Pre-admission baseline In/Out Bed: Needs assistance Is this a change from baseline?: Pre-admission baseline Walks in Home: Needs assistance Is this a change from baseline?: Pre-admission baseline Does the patient have difficulty walking or climbing stairs?: Yes Weakness of Legs: Both Weakness of Arms/Hands: Right  Permission Sought/Granted Permission sought to share information with : Case Manager    Share Information with NAME: Case Manager     Permission granted to share info w Relationship: [name] son [phone] or [name] son [phone]     Emotional Assessment Appearance:: Appears stated age Attitude/Demeanor/Rapport: Gracious Affect (typically observed): Accepting Orientation: : Oriented to Self Alcohol / Substance Use: Not Applicable Psych Involvement: No (comment)  Admission diagnosis:  Sepsis ([place]) [A41.9] Sepsis, due to unspecified organism, unspecified whether acute organ dysfunction present [facility]) [A41.9] Patient Active  Problem List   Diagnosis Date Noted   SIRS (systemic inflammatory response syndrome) ([place])  05/22/2021   History of CVA (cerebrovascular accident) 05/22/2021   Gait instability 04/24/2021   Seasonal allergic rhinitis due to pollen 04/24/2021   Ascending aorta dilatation ([place]) 04/05/2021   HOCM (hypertrophic obstructive cardiomyopathy) ([place]) 04/04/2021   Physical deconditioning 04/04/2021   Syncope 04/03/2021   Fall at home, initial encounter 03/31/2021   Thyroid nodule 03/31/2021   CAP (community acquired pneumonia) 11/15/2020   Dehydration 11/15/2020   Erosive esophagitis    Hiatal hernia    Hyponatremia 10/10/2020   Acute kidney injury superimposed on chronic kidney disease ([place]) 10/10/2020   Aortic atherosclerosis ([place]) 10/09/2020   Acute renal failure superimposed on stage 3a chronic kidney disease ([place]) 10/09/2020   Hyperkalemia 10/09/2020   Hearing loss 10/09/2020   Prolonged QT interval 10/09/2020   Colitis, acute 08/11/2019   Colitis 07/11/2019   Loose stools 06/07/2019   Herpes zoster without complication 22/33/6122   Other long term (current) drug therapy 08/12/2018   Chronic kidney disease, stage 3a ([place]) 07/19/2018   Hypertensive nephropathy 07/19/2018   Community acquired pneumonia of right upper lobe of lung 11/07/2017   Hypertensive emergency 07/01/2016   Thalamic hemorrhage ([place]) 07/01/2016   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[place])    Benign essential HTN    Type 2 diabetes mellitus with stage 3 chronic kidney disease, without long-term current use of insulin ([place])    Mixed hyperlipidemia    ICH (intracerebral hemorrhage) ([place]) - hypertensive R thalamic hemorrhage  06/27/2016   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   Retroperitoneal bleed 02/08/2016   Generalized abdominal pain    Gastroenteritis 02/04/2016   Diarrhea 02/04/2016   Hypokalemia 02/04/2016   Incarcerated paraesophageal hernia 09/02/2014   Renal mass, left 09/02/2014   Acute esophagitis 09/02/2014   Gastric outlet obstruction 09/02/2014   Hypertension    Vomiting 09/01/2014    PCP:  Glendale Chard, MD Pharmacy:   [facility] [address] [place] [address] Phone: [phone] Fax: [phone]     Social Determinants of Health (SDOH) Interventions    Readmission Risk Interventions No flowsheet data found.

## 2021-05-23 NOTE — ED Notes (Signed)
ED TO INPATIENT HANDOFF REPORT  ED Nurse Name and Phone #: Erick Colace, [phone]  S Name/Age/Gender Anna Hoffman 86 y.o. female Room/Bed: WA15/WA15  Code Status   Code Status: Full Code  Home/SNF/Other Home Patient oriented to: self, place, and situation Is this baseline? Yes   Triage Complete: Triage complete  Chief Complaint Sepsis [facility]) [A41.9]  Triage Note Pt presents d/t N/V and AMS that began today.  Caregiver reports that urine specimen obtained today to look for UTI.     Allergies Allergies  Allergen Reactions   Amoxicillin Diarrhea    Has patient had a PCN reaction causing immediate rash, facial/tongue/throat swelling, SOB or lightheadedness with hypotension: no Has patient had a PCN reaction causing severe rash involving mucus membranes or skin necrosis: no Has patient had a PCN reaction that required hospitalization: no pharmacist consult Has patient had a PCN reaction occurring within the last 10 years: yes If all of the above answers are "NO", then may proceed with Cephalosporin use.    Ampicillin Rash    - Tolerates Rocephin and Ancef - Remote occurrence; no symptoms of anaphylaxis or severe cutaneous reaction, and no additional medical attention required     Sulfa Antibiotics Rash    Level of Care/Admitting Diagnosis ED Disposition     ED Disposition  Admit   Condition  --   [place]: [place] [[id number]]  Level of Care: Progressive [102]  Admit to Progressive based on following criteria: MULTISYSTEM THREATS such as stable sepsis, metabolic/electrolyte imbalance with or without encephalopathy that is responding to early treatment.  May place patient in observation at [facility] or [facility] if equivalent level of care is available:: Yes  Covid Evaluation: Asymptomatic Screening Protocol (No Symptoms)  Diagnosis: Sepsis [facility]) [[id number]]  Admitting Physician: Marcelyn Bruins [[id number]]  Attending  Physician: Marcelyn Bruins [[id number]]          B Medical/Surgery History Past Medical History:  Diagnosis Date   Arthritis    Diabetes mellitus without complication ([place])    Hypertension    Hypokalemia    Pneumonia    Shingles    Stroke [facility])    Past Surgical History:  Procedure Laterality Date   ABDOMINAL HYSTERECTOMY     BREAST EXCISIONAL BIOPSY Left    ESOPHAGOGASTRODUODENOSCOPY N/A 09/01/2014   Procedure: ESOPHAGOGASTRODUODENOSCOPY (EGD);  Surgeon: Lear Ng, MD;  Location: [facility];  Service: Endoscopy;  Laterality: N/A;   ESOPHAGOGASTRODUODENOSCOPY (EGD) WITH PROPOFOL N/A 10/12/2020   Procedure: ESOPHAGOGASTRODUODENOSCOPY (EGD) WITH PROPOFOL;  Surgeon: Mauri Pole, MD;  Location: [facility] ENDOSCOPY;  Service: Endoscopy;  Laterality: N/A;   HIATAL HERNIA REPAIR N/A 09/04/2014   Procedure: LAPAROSCOPIC REPAIR OF HIATAL HERNIA;  Surgeon: Excell Seltzer, MD;  Location: [facility] ORS;  Service: General;  Laterality: N/A;  With MESH   IR GENERIC HISTORICAL  04/10/2016   IR US GUIDE VASC ACCESS RIGHT 04/10/2016 Corrie Mckusick, DO [facility]   IR GENERIC HISTORICAL  04/10/2016   IR ANGIOGRAM SELECTIVE EACH ADDITIONAL VESSEL 04/10/2016 Corrie Mckusick, DO [facility]   IR GENERIC HISTORICAL  04/10/2016   IR ANGIOGRAM SELECTIVE EACH ADDITIONAL VESSEL 04/10/2016 Corrie Mckusick, DO [facility]   IR GENERIC HISTORICAL  04/10/2016   IR EMBO TUMOR ORGAN ISCHEMIA INFARCT INC GUIDE ROADMAPPING 04/10/2016 Corrie Mckusick, DO [facility]   IR GENERIC HISTORICAL  04/10/2016   IR ANGIOGRAM SELECTIVE EACH ADDITIONAL VESSEL 04/10/2016 Corrie Mckusick, DO [facility]   IR GENERIC HISTORICAL  04/10/2016   IR ANGIOGRAM SELECTIVE EACH ADDITIONAL VESSEL 04/10/2016 Corrie Mckusick, DO [facility]   IR GENERIC HISTORICAL  04/10/2016   IR RENAL SELECTIVE  UNI INC S&I MOD SED 04/10/2016 Corrie Mckusick, DO [facility]   IR GENERIC HISTORICAL  03/06/2016   IR RADIOLOGIST EVAL & MGMT 03/06/2016 Corrie Mckusick, DO [facility]  RAD   IR GENERIC HISTORICAL  03/26/2016   IR RADIOLOGIST EVAL & MGMT 03/26/2016 Corrie Mckusick, DO [facility]   IR GENERIC HISTORICAL  04/29/2016   IR RADIOLOGIST EVAL & MGMT 04/29/2016 [facility]   IR RADIOLOGIST EVAL & MGMT  11/12/2016   IR RADIOLOGIST EVAL & MGMT  12/16/2017   KNEE SURGERY     TONSILLECTOMY       A IV Location/Drains/Wounds Patient Lines/Drains/Airways Status     Active Line/Drains/Airways     Name Placement date Placement time Site Days   Peripheral IV 05/22/21 22 G 1" Left;Anterior Forearm 05/22/21  2100  Forearm  1   Peripheral IV 05/22/21 20 G Right Antecubital 05/22/21  2155  Antecubital  1   External Urinary Catheter 05/09/21  1622  --  14   Incision (Closed) 09/04/14 Abdomen 09/04/14  1503  -- 2453   Incision - 6 Ports Abdomen Right;Lateral Right;Medial Left;Medial Left;Medial;Lateral Left;Lateral Mid;Upper 09/04/14  1455  -- 2453            Intake/Output Last 24 hours  Intake/Output Summary (Last 24 hours) at 05/23/2021 0042 Last data filed at 05/23/2021 0039 Gross per 24 hour  Intake 1450 ml  Output --  Net 1450 ml    Labs/Imaging Results for orders placed or performed during the hospital encounter of 05/22/21 (from the past 48 hour(s))  Lactic acid, plasma     Status: Abnormal   Collection Time: 05/22/21  8:30 PM  Result Value Ref Range   Lactic Acid, Venous 2.0 (HH) 0.5 - 1.9 mmol/L    Comment: CRITICAL RESULT CALLED TO, READ BACK BY AND VERIFIED WITH:  Astrid Divine RN 05/22/21 @ 2124 Performed at [facility], [place] [address], [place], [place] [address]   Comprehensive metabolic panel     Status: Abnormal   Collection Time: 05/22/21  8:30 PM  Result Value Ref Range   Sodium 137 135 - 145 mmol/L   Potassium 4.4 3.5 - 5.1 mmol/L   Chloride 109 98 - 111 mmol/L   CO2 13 (L) 22 - 32 mmol/L   Glucose, Bld 150 (H) 70 - 99 mg/dL    Comment: Glucose reference range applies only to samples taken after fasting for  at least 8 hours.   BUN 38 (H) 8 - 23 mg/dL   Creatinine, Ser 1.86 (H) 0.44 - 1.00 mg/dL   Calcium 10.5 (H) 8.9 - 10.3 mg/dL   Total Protein 8.8 (H) 6.5 - 8.1 g/dL   Albumin 5.0 3.5 - 5.0 g/dL   AST 19 15 - 41 U/L   ALT 17 0 - 44 U/L   Alkaline Phosphatase 72 38 - 126 U/L   Total Bilirubin 0.8 0.3 - 1.2 mg/dL   GFR, Estimated 26 (L) >60 mL/min    Comment: (NOTE) Calculated using the CKD-EPI Creatinine Equation (2021)    Anion gap 15 5 - 15    Comment: Performed at [facility], [place] [address], [place], [place] [address]  CBC WITH DIFFERENTIAL     Status: Abnormal   Collection Time: 05/22/21  8:30 PM  Result Value  Ref Range   WBC 17.4 (H) 4.0 - 10.5 K/uL   RBC 5.51 (H) 3.87 - 5.11 MIL/uL   Hemoglobin 16.1 (H) 12.0 - 15.0 g/dL   HCT 48.6 (H) 36.0 - 46.0 %   MCV 88.2 80.0 - 100.0 fL   MCH 29.2 26.0 - 34.0 pg   MCHC 33.1 30.0 - 36.0 g/dL   RDW 15.8 (H) 11.5 - 15.5 %   Platelets 391 150 - 400 K/uL   nRBC 0.0 0.0 - 0.2 %   Neutrophils Relative % 87 %   Neutro Abs 15.1 (H) 1.7 - 7.7 K/uL   Lymphocytes Relative 8 %   Lymphs Abs 1.4 0.7 - 4.0 K/uL   Monocytes Relative 4 %   Monocytes Absolute 0.7 0.1 - 1.0 K/uL   Eosinophils Relative 0 %   Eosinophils Absolute 0.0 0.0 - 0.5 K/uL   Basophils Relative 0 %   Basophils Absolute 0.0 0.0 - 0.1 K/uL   Immature Granulocytes 1 %   Abs Immature Granulocytes 0.12 (H) 0.00 - 0.07 K/uL    Comment: Performed at [facility], [place] [address], [place], [place] [address]  Lipase, blood     Status: None   Collection Time: 05/22/21  8:30 PM  Result Value Ref Range   Lipase 26 11 - 51 U/L    Comment: Performed at [facility], [place] [address], [place], [place] [address]  Magnesium     Status: Abnormal   Collection Time: 05/22/21  8:42 PM  Result Value Ref Range   Magnesium 2.5 (H) 1.7 - 2.4 mg/dL    Comment: Performed at [facility], [place] [address] [place], [place]  [address]  Lactic acid, plasma     Status: None   Collection Time: 05/22/21  9:58 PM  Result Value Ref Range   Lactic Acid, Venous 1.7 0.5 - 1.9 mmol/L    Comment: Performed at [facility], [place] [address], [place], [place] [address]  Protime-INR     Status: None   Collection Time: 05/22/21  9:58 PM  Result Value Ref Range   Prothrombin Time 13.6 11.4 - 15.2 seconds   INR 1.0 0.8 - 1.2    Comment: (NOTE) INR goal varies based on device and disease states. Performed at [facility], [place] [address] [place], [place] [address]   APTT     Status: None   Collection Time: 05/22/21  9:58 PM  Result Value Ref Range   aPTT 27 24 - 36 seconds    Comment: Performed at [facility], [place] [address], [place], [place] [address]   CT ABDOMEN PELVIS WO CONTRAST  Result Date: 05/22/2021 CLINICAL DATA:  Nausea/vomiting, altered mental status EXAM: CT ABDOMEN AND PELVIS WITHOUT CONTRAST TECHNIQUE: Multidetector CT imaging of the abdomen and pelvis was performed following the standard protocol without IV contrast. COMPARISON:  10/09/2020 FINDINGS: Lower chest: Mild subpleural scarring in the medial right lower lobe. Hepatobiliary: Unenhanced liver is unremarkable. Gallbladder is unremarkable. No intrahepatic or extrahepatic duct dilatation. Pancreas: Within normal limits. Spleen: Within normal limits. Adrenals/Urinary Tract: Adrenal glands are within normal limits. 2.7 cm medial left upper pole renal lesion with macroscopic fat, unchanged, compatible with a benign renal angiomyolipoma. Additional 3.1 cm left lower pole renal cyst. Right kidney is within normal limits. No hydronephrosis. Bladder is within normal limits. Stomach/Bowel: Stomach is notable for a moderate hiatal hernia. No evidence of bowel obstruction. Appendix is not discretely visualized.  Sigmoid diverticulosis, without evidence of diverticulitis. Vascular/Lymphatic: No evidence of abdominal  aortic aneurysm. Atherosclerotic calcifications of the abdominal aorta and branch vessels. No suspicious abdominopelvic lymphadenopathy. Reproductive: Status post hysterectomy. No adnexal masses. Other: No abdominopelvic ascites. Musculoskeletal: Grade 2 anterolisthesis of L5 on S1. Mild degenerative changes of the lower lumbar spine. IMPRESSION: Sigmoid diverticulosis, without evidence of diverticulitis. Moderate hiatal hernia. Stable left renal angiomyolipoma. Electronically Signed   By: Julian Hy M.D.   On: 05/22/2021 22:50   CT Head Wo Contrast  Result Date: 05/22/2021 CLINICAL DATA:  Head trauma EXAM: CT HEAD WITHOUT CONTRAST TECHNIQUE: Contiguous axial images were obtained from the base of the skull through the vertex without intravenous contrast. COMPARISON:  CT brain 05/09/2021, 03/31/2021 FINDINGS: Brain: No acute territorial infarction, hemorrhage or intracranial mass. Advanced atrophy. Advanced chronic small vessel ischemic changes of the white matter. Stable ventricle size. Vascular: No hyperdense vessels.  Carotid vascular calcification Skull: Normal. Negative for fracture or focal lesion. Sinuses/Orbits: No acute finding. Other: None IMPRESSION: 1. No CT evidence for acute intracranial abnormality. 2. Atrophy and chronic small vessel ischemic changes of the white matter Electronically Signed   By: Donavan Foil M.D.   On: 05/22/2021 19:56   DG Chest Port 1 View  Result Date: 05/22/2021 CLINICAL DATA:  Nausea vomiting EXAM: PORTABLE CHEST 1 VIEW COMPARISON:  05/09/2021 FINDINGS: The heart size and mediastinal contours are within normal limits. Aortic atherosclerosis. Both lungs are clear. The visualized skeletal structures are unremarkable. IMPRESSION: No active disease. Electronically Signed   By: Donavan Foil M.D.   On: 05/22/2021 19:57    Pending Labs Unresulted Labs (From admission, onward)     Start     Ordered   05/29/21 0500  Creatinine, serum  (enoxaparin (LOVENOX)     CrCl < 30 ml/min)  Weekly,   R     Comments: while on enoxaparin therapy.    05/22/21 2349   05/23/21 0500  Protime-INR  Tomorrow morning,   R        05/22/21 2349   05/23/21 0500  Comprehensive metabolic panel  Tomorrow morning,   R        05/22/21 2349   05/23/21 0500  CBC  Tomorrow morning,   R        05/22/21 2349   05/23/21 0042  Resp Panel by RT-PCR (Flu A&B, Covid) Nasopharyngeal Swab  (Tier 2 - Symptomatic/asymptomatic)  Once,   R        05/23/21 0042   05/22/21 2349  Surgical pcr screen  Once,   R        05/22/21 2349   05/22/21 1929  Blood Culture (routine x 2)  (Undifferentiated presentation (screening labs and basic nursing orders))  BLOOD CULTURE X 2,   STAT      05/22/21 1928   05/22/21 1928  Urinalysis, Routine w reflex microscopic Urine, In & Out Cath  (Undifferentiated presentation (screening labs and basic nursing orders))  ONCE - STAT,   STAT        05/22/21 1928   05/22/21 1928  Urine Culture  (Undifferentiated presentation (screening labs and basic nursing orders))  ONCE - STAT,   STAT       Question:  Indication  Answer:  Sepsis   05/22/21 1928            Vitals/Pain Today's Vitals   05/22/21 2200 05/22/21 2230 05/22/21 2330 05/23/21 0000  BP: (!) 209/117 (!) 183/119 (!) 193/116 (!) 199/119  Pulse: Marland Kitchen)  113 (!) 114 (!) 112 (!) 111  Resp: 20 (!) 21 16 15   Temp:      TempSrc:      SpO2: 95% 97% 96% 97%    Isolation Precautions No active isolations  Medications Medications  lactated ringers infusion ( Intravenous New Bag/Given 05/22/21 2218)  rosuvastatin (CRESTOR) tablet 20 mg (has no administration in time range)  pantoprazole (PROTONIX) EC tablet 40 mg (has no administration in time range)  carboxymethylcellul-glycerin (REFRESH OPTIVE) 0.5-0.9 % ophthalmic solution 1 drop (has no administration in time range)  trolamine salicylate (ASPERCREME) 10 % cream 1 application (has no administration in time range)  enoxaparin (LOVENOX) injection 30 mg (has  no administration in time range)  sodium chloride flush (NS) 0.9 % injection 3 mL (has no administration in time range)  polyethylene glycol (MIRALAX / GLYCOLAX) packet 17 g (has no administration in time range)  acetaminophen (TYLENOL) tablet 650 mg (has no administration in time range)    Or  acetaminophen (TYLENOL) suppository 650 mg (has no administration in time range)  metroNIDAZOLE (FLAGYL) IVPB 500 mg (has no administration in time range)  ceFEPIme (MAXIPIME) 2 g in sodium chloride 0.9 % 100 mL IVPB (has no administration in time range)  sodium chloride 0.9 % bolus 1,000 mL (0 mLs Intravenous Stopped 05/23/21 0038)  ceFEPIme (MAXIPIME) 2 g in sodium chloride 0.9 % 100 mL IVPB (0 g Intravenous Stopped 05/23/21 0038)  metroNIDAZOLE (FLAGYL) IVPB 500 mg (0 mg Intravenous Stopped 05/23/21 0039)  vancomycin (VANCOREADY) IVPB 1250 mg/250 mL (0 mg Intravenous Stopped 05/23/21 0039)    Mobility walks with device High fall risk   Focused Assessments    R Recommendations: See Admitting Provider Note  Report given to:   Additional Notes:

## 2021-05-23 NOTE — Progress Notes (Signed)
Patient had one episode of emesis after eating a few bites of breakfast and taking morning medication.

## 2021-05-23 NOTE — Care Management Obs Status (Signed)
[place] NOTIFICATION   Patient Details  Name: Anna Hoffman MRN: [id number] Date of Birth: May 09, 1935   Medicare Observation Status Notification Given:  Yes    MahabirJuliann Pulse, RN 05/23/2021, 1:45 PM

## 2021-05-23 NOTE — Care Management Important Message (Deleted)
Important Message  Patient Details  Name: Anna Hoffman MRN: [id number] Date of Birth: 18-Oct-1934   Medicare Important Message Given:  Yes     MahabirJuliann Pulse, RN 05/23/2021, 1:44 PM

## 2021-05-23 NOTE — Progress Notes (Addendum)
PROGRESS NOTE  Anna Hoffman GHW:[id number] DOB: 1934/09/16 DOA: 05/22/2021 PCP: Glendale Chard, MD   LOS: 0 days   Brief narrative:  Anna Hoffman is a 86 y.o. female with medical history significant of esophagitis, CKD stage IIIa, hypertension, colitis, hypertrophic cardiomyopathy and intracerebral hemorrhage, diabetes presented to hospital with nausea vomiting and weakness for 2 to 3 days with difficulty ambulating and confusion for few weeks.  She also had a fall with some trauma to the right shoulder.  In the ED patient was mildly tachycardic and hypertensive.  Creatinine was elevated at 1.82 from baseline 1.2.  There was mild leukocytosis but hemoglobin was 6.1.  Reselected was mildly elevated.  X-ray of chest, CT head scan and pelvic scan were negative for acute findings except for left renal angiomyolipoma.  Patient received vancomycin cefepime and Flagyl in the ED and was admitted to hospital after IV fluid bolus.    Assessment/Plan:  Principal Problem:   SIRS (systemic inflammatory response syndrome) ([name]) Active Problems:   Benign essential HTN   Type 2 diabetes mellitus with stage 3 chronic kidney disease, without long-term current use of insulin ([name])   Mixed hyperlipidemia   Acute renal failure superimposed on stage 3a chronic kidney disease ([name])   Prolonged QT interval   Dehydration   HOCM (hypertrophic obstructive cardiomyopathy) ([name])   History of CVA (cerebrovascular accident)   Sepsis  SIRS criteria on presentation with tachycardia, elevated lactate leukocytosis.  Had been having ongoing nausea and vomiting.  CT scan of the abdomen pelvis did not show any acute findings.  Patient had been having some confusion.  Patient did have mild hypotension in the ED.  Lactate improved with IV fluids.  Currently on empiric antibiotic with cefepime and Flagyl.  MRSA PCR was negative.  COVID and influenza was negative.  Blood cultures urine cultures pending at this time.   Temperature max of 98.9 F.  Leuko-cytosis has slightly trended down.  We will de-escalate antibiotic by tomorrow if no cultures and clinical improvement.  AKI on CKD 3 Baseline creatinine of around 1.2.  Was 1.6 on presentation.  Received IV fluids.  Creatinine of 1.7 today.  We will decrease IV fluid for now.  Received IV fluid needs in the morning.  Hypertrophic cardiomyopathy with history of Intermittent second degree heart block Last known 2D echocardiogram with EF 78-93% with G1 diastolic dysfunction and focal findings.  Patient does have history of syncope secondary to intermittent secondary heart block and has plans for pacemaker placement in the future.  Avoid volume depletion and nodal blocking agents for now.  Essential hypertension Patient was very hypertensive in the ED.  On hydralazine at this time.  Had been on metoprolol only in the past and had blood pressure in the 130s while off of this medication.  No medication listed in her medication list.  History of second-degree heart block so avoiding nodal blockers.    Prolonged QT On presentation.  Continue to monitor electrolytes and replenish as necessary.  Diabetes mellitus type II Continue sliding scale insulin Accu-Cheks, diabetic diet.  Hold oral hypoglycemic agents.   Hyperlipidemia - Continue home statin  Recent fall, debility weakness.  We will get PT evaluation.  Right shoulder pain from recent fall.  Continue K pad local analgesic cream Tylenol.  X-ray of the right shoulder without any fracture.  We will get PT evaluation.  DVT prophylaxis: enoxaparin (LOVENOX) injection 30 mg Start: 05/23/21 0600  Code Status: Full code  Family Communication: None  Status is: Observation  The patient will require care spanning > 2 midnights and should be moved to inpatient because: Need for IV antibiotics, physical therapy evaluation, ongoing shoulder  pain,   Consultants: None  Procedures: None  Anti-infectives:  Cefepime and Metro  Anti-infectives (From admission, onward)    Start     Dose/Rate Route Frequency Ordered Stop   05/23/21 2200  ceFEPIme (MAXIPIME) 2 g in sodium chloride 0.9 % 100 mL IVPB        2 g 200 mL/hr over 30 Minutes Intravenous Every 24 hours 05/23/21 0008     05/23/21 0900  metroNIDAZOLE (FLAGYL) IVPB 500 mg        500 mg 100 mL/hr over 60 Minutes Intravenous Every 12 hours 05/22/21 2349     05/22/21 2130  ceFEPIme (MAXIPIME) 2 g in sodium chloride 0.9 % 100 mL IVPB        2 g 200 mL/hr over 30 Minutes Intravenous  Once 05/22/21 2126 05/23/21 0038   05/22/21 2130  metroNIDAZOLE (FLAGYL) IVPB 500 mg        500 mg 100 mL/hr over 60 Minutes Intravenous  Once 05/22/21 2126 05/23/21 0039   05/22/21 2130  vancomycin (VANCOCIN) IVPB 1000 mg/200 mL premix  Status:  Discontinued        1,000 mg 200 mL/hr over 60 Minutes Intravenous  Once 05/22/21 2126 05/22/21 2129   05/22/21 2130  vancomycin (VANCOREADY) IVPB 1250 mg/250 mL        1,250 mg 166.7 mL/hr over 90 Minutes Intravenous  Once 05/22/21 2129 05/23/21 0039      Subjective: Today, patient was seen and examined at bedside.  Patient complains of not feeling well in pain over the shoulder after her recent fall.  Has less nausea vomiting but continues to have fatigue and weakness.  Objective: Vitals:   05/23/21 0254 05/23/21 0613  BP: (!) 181/110 (!) 172/114  Pulse:  (!) 102  Resp:  18  Temp:  97.7 F (36.5 C)  SpO2:  97%    Intake/Output Summary (Last 24 hours) at 05/23/2021 1121 Last data filed at 05/23/2021 0604 Gross per 24 hour  Intake 2020 ml  Output --  Net 2020 ml   Filed Weights   05/23/21 0237  Weight: 61.4 kg   Body mass index is 22.53 kg/m.   Physical Exam: GENERAL: Patient is alert awake and oriented. Not in obvious distress. HENT: No scleral pallor or icterus. Pupils equally reactive to light. Oral mucosa is  moist NECK: is supple, no gross swelling noted. CHEST: Clear to auscultation. No crackles or wheezes.  Diminished breath sounds bilaterally. CVS: S1 and S2 heard, no murmur. Regular rate and rhythm.  ABDOMEN: Soft, non-tender, bowel sounds are present. EXTREMITIES: No edema.  Tenderness over the right shoulder without any bruise but restricted mobility. CNS: Cranial nerves are intact. No focal motor deficits. SKIN: warm and dry without rashes.  Data Review: I have personally reviewed the following laboratory data and studies,  CBC: Recent Labs  Lab 05/22/21 2030 05/23/21 0448  WBC 17.4* 14.6*  NEUTROABS 15.1*  --   HGB 16.1* 12.7  HCT 48.6* 39.5  MCV 88.2 90.6  PLT 391 409   Basic Metabolic Panel: Recent Labs  Lab 05/22/21 2030 05/22/21 2042 05/23/21 0448  NA 137  --  132*  K 4.4  --  3.9  CL 109  --  108  CO2 13*  --  13*  GLUCOSE 150*  --  121*  BUN 38*  --  33*  CREATININE 1.86*  --  1.70*  CALCIUM 10.5*  --  9.1  MG  --  2.5*  --    Liver Function Tests: Recent Labs  Lab 05/22/21 2030 05/23/21 0448  AST 19 17  ALT 17 13  ALKPHOS 72 54  BILITOT 0.8 0.8  PROT 8.8* 6.9  ALBUMIN 5.0 3.8   Recent Labs  Lab 05/22/21 2030  LIPASE 26   No results for input(s): AMMONIA in the last 168 hours. Cardiac Enzymes: No results for input(s): CKTOTAL, CKMB, CKMBINDEX, TROPONINI in the last 168 hours. BNP (last 3 results) No results for input(s): BNP in the last 8760 hours.  ProBNP (last 3 results) No results for input(s): PROBNP in the last 8760 hours.  CBG: Recent Labs  Lab 05/23/21 0752  GLUCAP 106*   Recent Results (from the past 240 hour(s))  Surgical pcr screen     Status: Abnormal   Collection Time: 05/23/21 12:49 AM   Specimen: Nasal Mucosa; Nasal Swab  Result Value Ref Range Status   MRSA, PCR NEGATIVE NEGATIVE Final   Staphylococcus aureus POSITIVE (A) NEGATIVE Final    Comment: (NOTE) The Xpert SA Assay (FDA approved for NASAL specimens in  patients 66 years of age and older), is one component of a comprehensive surveillance program. It is not intended to diagnose infection nor to guide or monitor treatment. Performed at [facility], [place] [address], [place], [place] [address]   Resp Panel by RT-PCR (Flu A&B, Covid) Nasal Mucosa     Status: None   Collection Time: 05/23/21 12:49 AM   Specimen: Nasal Mucosa; Nasopharyngeal(NP) swabs in vial transport medium  Result Value Ref Range Status   SARS Coronavirus 2 by RT PCR NEGATIVE NEGATIVE Final    Comment: (NOTE) SARS-CoV-2 target nucleic acids are NOT DETECTED.  The SARS-CoV-2 RNA is generally detectable in upper respiratory specimens during the acute phase of infection. The lowest concentration of SARS-CoV-2 viral copies this assay can detect is 138 copies/mL. A negative result does not preclude SARS-Cov-2 infection and should not be used as the sole basis for treatment or other patient management decisions. A negative result may occur with  improper specimen collection/handling, submission of specimen other than nasopharyngeal swab, presence of viral mutation(s) within the areas targeted by this assay, and inadequate number of viral copies(<138 copies/mL). A negative result must be combined with clinical observations, patient history, and epidemiological information. The expected result is Negative.  Fact Sheet for Patients:  EntrepreneurPulse.com.au  Fact Sheet for Healthcare Providers:  IncredibleEmployment.be  This test is no t yet approved or cleared by the [place] FDA and  has been authorized for detection and/or diagnosis of SARS-CoV-2 by FDA under an Emergency Use Authorization (EUA). This EUA will remain  in effect (meaning this test can be used) for the duration of the COVID-19 declaration under Section 564(b)(1) of the Act, 21 U.S.C.section 360bbb-3(b)(1), unless the authorization is terminated   or revoked sooner.       Influenza A by PCR NEGATIVE NEGATIVE Final   Influenza B by PCR NEGATIVE NEGATIVE Final    Comment: (NOTE) The Xpert Xpress SARS-CoV-2/FLU/RSV plus assay is intended as an aid in the diagnosis of influenza from Nasopharyngeal swab specimens and should not be used as a sole basis for treatment. Nasal washings and aspirates are unacceptable for Xpert Xpress SARS-CoV-2/FLU/RSV testing.  Fact Sheet for Patients: EntrepreneurPulse.com.au  Fact Sheet for Healthcare Providers: IncredibleEmployment.be  This  test is not yet approved or cleared by the [place] and has been authorized for detection and/or diagnosis of SARS-CoV-2 by FDA under an Emergency Use Authorization (EUA). This EUA will remain in effect (meaning this test can be used) for the duration of the COVID-19 declaration under Section 564(b)(1) of the Act, 21 U.S.C. section 360bbb-3(b)(1), unless the authorization is terminated or revoked.  Performed at [facility], [place] [address] [place], [place] [address]      Studies: CT ABDOMEN PELVIS WO CONTRAST  Result Date: 05/22/2021 CLINICAL DATA:  Nausea/vomiting, altered mental status EXAM: CT ABDOMEN AND PELVIS WITHOUT CONTRAST TECHNIQUE: Multidetector CT imaging of the abdomen and pelvis was performed following the standard protocol without IV contrast. COMPARISON:  10/09/2020 FINDINGS: Lower chest: Mild subpleural scarring in the medial right lower lobe. Hepatobiliary: Unenhanced liver is unremarkable. Gallbladder is unremarkable. No intrahepatic or extrahepatic duct dilatation. Pancreas: Within normal limits. Spleen: Within normal limits. Adrenals/Urinary Tract: Adrenal glands are within normal limits. 2.7 cm medial left upper pole renal lesion with macroscopic fat, unchanged, compatible with a benign renal angiomyolipoma. Additional 3.1 cm left lower pole renal cyst. Right kidney is  within normal limits. No hydronephrosis. Bladder is within normal limits. Stomach/Bowel: Stomach is notable for a moderate hiatal hernia. No evidence of bowel obstruction. Appendix is not discretely visualized. Sigmoid diverticulosis, without evidence of diverticulitis. Vascular/Lymphatic: No evidence of abdominal aortic aneurysm. Atherosclerotic calcifications of the abdominal aorta and branch vessels. No suspicious abdominopelvic lymphadenopathy. Reproductive: Status post hysterectomy. No adnexal masses. Other: No abdominopelvic ascites. Musculoskeletal: Grade 2 anterolisthesis of L5 on S1. Mild degenerative changes of the lower lumbar spine. IMPRESSION: Sigmoid diverticulosis, without evidence of diverticulitis. Moderate hiatal hernia. Stable left renal angiomyolipoma. Electronically Signed   By: Julian Hy M.D.   On: 05/22/2021 22:50   CT Head Wo Contrast  Result Date: 05/22/2021 CLINICAL DATA:  Head trauma EXAM: CT HEAD WITHOUT CONTRAST TECHNIQUE: Contiguous axial images were obtained from the base of the skull through the vertex without intravenous contrast. COMPARISON:  CT brain 05/09/2021, 03/31/2021 FINDINGS: Brain: No acute territorial infarction, hemorrhage or intracranial mass. Advanced atrophy. Advanced chronic small vessel ischemic changes of the white matter. Stable ventricle size. Vascular: No hyperdense vessels.  Carotid vascular calcification Skull: Normal. Negative for fracture or focal lesion. Sinuses/Orbits: No acute finding. Other: None IMPRESSION: 1. No CT evidence for acute intracranial abnormality. 2. Atrophy and chronic small vessel ischemic changes of the white matter Electronically Signed   By: Donavan Foil M.D.   On: 05/22/2021 19:56   DG Chest Port 1 View  Result Date: 05/22/2021 CLINICAL DATA:  Nausea vomiting EXAM: PORTABLE CHEST 1 VIEW COMPARISON:  05/09/2021 FINDINGS: The heart size and mediastinal contours are within normal limits. Aortic atherosclerosis. Both  lungs are clear. The visualized skeletal structures are unremarkable. IMPRESSION: No active disease. Electronically Signed   By: Donavan Foil M.D.   On: 05/22/2021 19:57      Flora Lipps, MD  [facility] 05/23/2021  If 7PM-7AM, please contact night-coverage

## 2021-05-23 NOTE — Progress Notes (Signed)
Pharmacy Antibiotic Note  Anna Hoffman is a 86 y.o. female admitted on 05/22/2021 for evaluataion of N/V and weakness.  Pharmacy has been consulted to dose cefepime for sepsis  Plan: Cefepime 2gm IV q24h Follow renal function,cultures and clinical course     Temp (24hrs), Avg:98.5 F (36.9 C), Min:98 F (36.7 C), Max:98.9 F (37.2 C)  Recent Labs  Lab 05/22/21 2030 05/22/21 2158  WBC 17.4*  --   CREATININE 1.86*  --   LATICACIDVEN 2.0* 1.7    Estimated Creatinine Clearance: 20.3 mL/min (A) (by C-G formula based on SCr of 1.86 mg/dL (H)).    Allergies  Allergen Reactions   Amoxicillin Diarrhea    Has patient had a PCN reaction causing immediate rash, facial/tongue/throat swelling, SOB or lightheadedness with hypotension: no Has patient had a PCN reaction causing severe rash involving mucus membranes or skin necrosis: no Has patient had a PCN reaction that required hospitalization: no pharmacist consult Has patient had a PCN reaction occurring within the last 10 years: yes If all of the above answers are "NO", then may proceed with Cephalosporin use.    Ampicillin Rash    - Tolerates Rocephin and Ancef - Remote occurrence; no symptoms of anaphylaxis or severe cutaneous reaction, and no additional medical attention required     Sulfa Antibiotics Rash     Thank you for allowing pharmacy to be a part of this patient's care.  Dolly Rias RPh 05/23/2021, 12:14 AM

## 2021-05-23 NOTE — Plan of Care (Signed)
  Problem: Education: Goal: Knowledge of General Education information will improve Description: Including pain rating scale, medication(s)/side effects and non-pharmacologic comfort measures Outcome: Progressing   Problem: Clinical Measurements: Goal: Diagnostic test results will improve Outcome: Progressing   Problem: Nutrition: Goal: Adequate nutrition will be maintained Outcome: Progressing   Problem: Coping: Goal: Level of anxiety will decrease Outcome: Progressing   Problem: Pain Managment: Goal: General experience of comfort will improve Outcome: Progressing

## 2021-05-24 DIAGNOSIS — E440 Moderate protein-calorie malnutrition: Secondary | ICD-10-CM | POA: Insufficient documentation

## 2021-05-24 DIAGNOSIS — I421 Obstructive hypertrophic cardiomyopathy: Secondary | ICD-10-CM

## 2021-05-24 DIAGNOSIS — N179 Acute kidney failure, unspecified: Secondary | ICD-10-CM | POA: Diagnosis not present

## 2021-05-24 DIAGNOSIS — I495 Sick sinus syndrome: Secondary | ICD-10-CM

## 2021-05-24 DIAGNOSIS — R6510 Systemic inflammatory response syndrome (SIRS) of non-infectious origin without acute organ dysfunction: Secondary | ICD-10-CM | POA: Diagnosis not present

## 2021-05-24 DIAGNOSIS — E860 Dehydration: Secondary | ICD-10-CM | POA: Diagnosis not present

## 2021-05-24 DIAGNOSIS — I10 Essential (primary) hypertension: Secondary | ICD-10-CM | POA: Diagnosis not present

## 2021-05-24 LAB — BASIC METABOLIC PANEL
Anion gap: 11 (ref 5–15)
BUN: 29 mg/dL — ABNORMAL HIGH (ref 8–23)
CO2: 15 mmol/L — ABNORMAL LOW (ref 22–32)
Calcium: 9.1 mg/dL (ref 8.9–10.3)
Chloride: 106 mmol/L (ref 98–111)
Creatinine, Ser: 1.57 mg/dL — ABNORMAL HIGH (ref 0.44–1.00)
GFR, Estimated: 32 mL/min — ABNORMAL LOW (ref >60)
Glucose, Bld: 98 mg/dL (ref 70–99)
Potassium: 3.2 mmol/L — ABNORMAL LOW (ref 3.5–5.1)
Sodium: 132 mmol/L — ABNORMAL LOW (ref 135–145)

## 2021-05-24 LAB — CBC
HCT: 37.4 % (ref 36.0–46.0)
Hemoglobin: 12.5 g/dL (ref 12.0–15.0)
MCH: 29.3 pg (ref 26.0–34.0)
MCHC: 33.4 g/dL (ref 30.0–36.0)
MCV: 87.8 fL (ref 80.0–100.0)
Platelets: 345 10*3/uL (ref 150–400)
RBC: 4.26 MIL/uL (ref 3.87–5.11)
RDW: 15.8 % — ABNORMAL HIGH (ref 11.5–15.5)
WBC: 10.6 10*3/uL — ABNORMAL HIGH (ref 4.0–10.5)
nRBC: 0 % (ref 0.0–0.2)

## 2021-05-24 LAB — GLUCOSE, CAPILLARY
Glucose-Capillary: 98 mg/dL (ref 70–99)
Glucose-Capillary: 114 mg/dL — ABNORMAL HIGH (ref 70–99)
Glucose-Capillary: 101 mg/dL — ABNORMAL HIGH (ref 70–99)
Glucose-Capillary: 116 mg/dL — ABNORMAL HIGH (ref 70–99)

## 2021-05-24 LAB — URINE CULTURE: Culture: NO GROWTH

## 2021-05-24 LAB — MAGNESIUM: Magnesium: 2 mg/dL (ref 1.7–2.4)

## 2021-05-24 MED ORDER — POTASSIUM CHLORIDE 10 MEQ/100ML IV SOLN
10.0000 meq | INTRAVENOUS | Status: AC
Start: 2021-05-24 — End: 2021-05-24
  Administered 2021-05-24 (×2): 10 meq via INTRAVENOUS
  Filled 2021-05-24 (×3): qty 100

## 2021-05-24 MED ORDER — PROPRANOLOL HCL 10 MG PO TABS
10.0000 mg | ORAL_TABLET | Freq: Two times a day (BID) | ORAL | Status: DC
  Administered 2021-05-24 – 2021-05-26 (×5): 10 mg via ORAL
  Filled 2021-05-24 (×6): qty 1

## 2021-05-24 MED ORDER — KATE FARMS STANDARD 1.4 PO LIQD
325.0000 mL | Freq: Two times a day (BID) | ORAL | Status: DC
  Administered 2021-05-25 – 2021-06-06 (×17): 325 mL via ORAL
  Filled 2021-05-24 (×27): qty 325

## 2021-05-24 MED ORDER — HYDRALAZINE HCL 20 MG/ML IJ SOLN
10.0000 mg | Freq: Four times a day (QID) | INTRAMUSCULAR | Status: DC | PRN
  Administered 2021-05-24 – 2021-05-26 (×3): 10 mg via INTRAVENOUS
  Filled 2021-05-24 (×3): qty 1

## 2021-05-24 MED ORDER — POTASSIUM CHLORIDE CRYS ER 20 MEQ PO TBCR
40.0000 meq | EXTENDED_RELEASE_TABLET | Freq: Once | ORAL | Status: AC
  Administered 2021-05-24: 40 meq via ORAL
  Filled 2021-05-24: qty 2

## 2021-05-24 MED ORDER — HYDRALAZINE HCL 25 MG PO TABS
25.0000 mg | ORAL_TABLET | Freq: Three times a day (TID) | ORAL | Status: DC
  Filled 2021-05-24: qty 1

## 2021-05-24 MED ORDER — ADULT MULTIVITAMIN W/MINERALS CH
1.0000 | ORAL_TABLET | Freq: Every day | ORAL | Status: DC
  Administered 2021-05-24 – 2021-06-06 (×12): 1 via ORAL
  Filled 2021-05-24 (×12): qty 1

## 2021-05-24 MED ORDER — HYDRALAZINE HCL 50 MG PO TABS
50.0000 mg | ORAL_TABLET | Freq: Three times a day (TID) | ORAL | Status: DC
  Administered 2021-05-24 – 2021-05-25 (×3): 50 mg via ORAL
  Filled 2021-05-24 (×3): qty 1

## 2021-05-24 NOTE — Evaluation (Signed)
Clinical/Bedside Swallow Evaluation Patient Details  Name: Anna Hoffman MRN: [id number] Date of Birth: Dec 19, 1934  Today's Date: 05/24/2021 Time: SLP Start Time (ACUTE ONLY): 53 SLP Stop Time (ACUTE ONLY): 7867 SLP Time Calculation (min) (ACUTE ONLY): 14 min  Past Medical History:  Past Medical History:  Diagnosis Date   Arthritis    Diabetes mellitus without complication ([place])    Hypertension    Hypokalemia    Pneumonia    Shingles    Stroke [facility])    Past Surgical History:  Past Surgical History:  Procedure Laterality Date   ABDOMINAL HYSTERECTOMY     BREAST EXCISIONAL BIOPSY Left    ESOPHAGOGASTRODUODENOSCOPY N/A 09/01/2014   Procedure: ESOPHAGOGASTRODUODENOSCOPY (EGD);  Surgeon: Lear Ng, MD;  Location: [facility];  Service: Endoscopy;  Laterality: N/A;   ESOPHAGOGASTRODUODENOSCOPY (EGD) WITH PROPOFOL N/A 10/12/2020   Procedure: ESOPHAGOGASTRODUODENOSCOPY (EGD) WITH PROPOFOL;  Surgeon: Mauri Pole, MD;  Location: [facility];  Service: Endoscopy;  Laterality: N/A;   HIATAL HERNIA REPAIR N/A 09/04/2014   Procedure: LAPAROSCOPIC REPAIR OF HIATAL HERNIA;  Surgeon: Excell Seltzer, MD;  Location: [facility];  Service: General;  Laterality: N/A;  With MESH   IR GENERIC HISTORICAL  04/10/2016   IR US GUIDE VASC ACCESS RIGHT 04/10/2016 Corrie Mckusick, DO [facility]   IR GENERIC HISTORICAL  04/10/2016   IR ANGIOGRAM SELECTIVE EACH ADDITIONAL VESSEL 04/10/2016 Corrie Mckusick, DO [facility]   IR GENERIC HISTORICAL  04/10/2016   IR ANGIOGRAM SELECTIVE EACH ADDITIONAL VESSEL 04/10/2016 Corrie Mckusick, DO [facility]   IR GENERIC HISTORICAL  04/10/2016   IR EMBO TUMOR ORGAN ISCHEMIA INFARCT INC GUIDE ROADMAPPING 04/10/2016 Corrie Mckusick, DO [facility]   IR GENERIC HISTORICAL  04/10/2016   IR ANGIOGRAM SELECTIVE EACH ADDITIONAL VESSEL 04/10/2016 Corrie Mckusick, DO [facility]   IR GENERIC HISTORICAL  04/10/2016   IR ANGIOGRAM SELECTIVE EACH ADDITIONAL VESSEL 04/10/2016 Corrie Mckusick, DO  [facility]   IR GENERIC HISTORICAL  04/10/2016   IR RENAL SELECTIVE  UNI INC S&I MOD SED 04/10/2016 Corrie Mckusick, DO [facility]   IR GENERIC HISTORICAL  03/06/2016   IR RADIOLOGIST EVAL & MGMT 03/06/2016 Corrie Mckusick, DO [facility]   IR GENERIC HISTORICAL  03/26/2016   IR RADIOLOGIST EVAL & MGMT 03/26/2016 Corrie Mckusick, DO [facility]   IR GENERIC HISTORICAL  04/29/2016   IR RADIOLOGIST EVAL & MGMT 04/29/2016 [facility]   IR RADIOLOGIST EVAL & MGMT  11/12/2016   IR RADIOLOGIST EVAL & MGMT  12/16/2017   KNEE SURGERY     TONSILLECTOMY     HPI:  Pt is an 86 yo female adm to [facility] with N/V. Pt has h/o esophagitis, HH, CKD Stage 3A, cardiomyopathy, DM, colitis, HTN, right lobe pna, ICH. CT head Small chronic infarcts in the right  thalamus and left cerebellar hemisphere are unchanged. PT with several admits to hospital in last several months.  Most recent for fall.    Assessment / Plan / Recommendation  Clinical Impression  Pt greeted slid down in bed. Pt is oriented to self and "cone" but not to situation as she repeatedly asked why she was "there"?  CN exam was unremarkable.  Pt willing to consume small amounts of intake that SLP provided to her including single bite of cracker, water and juice.  She is able to seal her lips on straw and orally transit boluses. Voice remained clear throughout intake, although pt was distracted with concerns for cause of her admission  to hospital.  No indication of aspiration or dysphagia noted and she only accepted a small amount of intake.  Pt reported displeasure with the taste of the water but enjoyed the juice.  Pt did not self feed, she is right handed and has an IV in her right AC.  Pt would not drink Ensure as she states her MD told her not to drink it.  Recommend continue diet as tolerated, encouraging po intake. SLP Visit Diagnosis: Dysphagia, unspecified (R13.10)    Aspiration Risk  No limitations    Diet Recommendation Regular;Thin liquid    Liquid Administration via: Cup;Straw Medication Administration:  (as tolerated) Compensations: Slow rate;Small sips/bites Postural Changes: Remain upright for at least 30 minutes after po intake;Seated upright at 90 degrees    Other  Recommendations Oral Care Recommendations: Oral care BID    Recommendations for follow up therapy are one component of a multi-disciplinary discharge planning process, led by the attending physician.  Recommendations may be updated based on patient status, additional functional criteria and insurance authorization.  Follow up Recommendations None      Frequency and Duration            Prognosis        Swallow Study   General Date of Onset: 05/24/21 HPI: Pt is an 86 yo female adm to [facility] with N/V. Pt has h/o esophagitis, HH, CKD Stage 3A, cardiomyopathy, DM, colitis, HTN, right lobe pna, ICH. CT head Small chronic infarcts in the right  thalamus and left cerebellar hemisphere are unchanged. PT with several admits to hospital in last several months.  Most recent for fall. Type of Study: Bedside Swallow Evaluation Diet Prior to this Study: Regular;Thin liquids Temperature Spikes Noted: No Respiratory Status: Room air History of Recent Intubation: No Behavior/Cognition: Alert;Cooperative;Pleasant mood Oral Cavity Assessment: Within Functional Limits Oral Care Completed by SLP: No Oral Cavity - Dentition: Adequate natural dentition Vision: Functional for self-feeding Self-Feeding Abilities: Able to feed self Patient Positioning: Upright in bed Baseline Vocal Quality: Normal Volitional Cough: Strong Volitional Swallow: Able to elicit    Oral/Motor/Sensory Function Overall Oral Motor/Sensory Function: Within functional limits   Ice Chips Ice chips: Not tested   Thin Liquid Thin Liquid: Within functional limits Presentation: Straw    Nectar Thick Nectar Thick Liquid: Not tested   Honey Thick Honey Thick Liquid: Not tested   Puree Puree: Not  tested   Solid    Kathleen Lime, MS [facility] SLP Acute Rehab Services Office [phone] Pager [phone]  Solid: Within functional limits      Macario Golds 05/24/2021,5:51 PM

## 2021-05-24 NOTE — Progress Notes (Addendum)
PROGRESS NOTE  Anna Hoffman XTK:[id number] DOB: September 12, 1934 DOA: 05/22/2021 PCP: Glendale Chard, MD   LOS: 1 day   Brief narrative:  Anna Hoffman is a 86 y.o. female with medical history significant of esophagitis, CKD stage IIIa, hypertension, colitis, hypertrophic cardiomyopathy and intracerebral hemorrhage, diabetes presented to hospital with nausea vomiting and weakness for 2 to 3 days with difficulty ambulating and confusion for few weeks.  She also had a fall with some trauma to the right shoulder.  In the ED, patient was mildly tachycardic and hypertensive.  Creatinine was elevated at 1.82 from baseline 1.2.  There was mild leukocytosis but hemoglobin was 16.1.  X-ray of chest, CT head scan and pelvic scan were negative for acute findings except for left renal angiomyolipoma.  Patient received vancomycin cefepime and Flagyl in the ED and was admitted to hospital after IV fluid bolus.    Assessment/Plan:  Principal Problem:   SIRS (systemic inflammatory response syndrome) ([name]) Active Problems:   Benign essential HTN   Type 2 diabetes mellitus with stage 3 chronic kidney disease, without long-term current use of insulin ([name])   Mixed hyperlipidemia   Acute renal failure superimposed on stage 3a chronic kidney disease ([name])   Prolonged QT interval   Dehydration   HOCM (hypertrophic obstructive cardiomyopathy) ([place])   History of CVA (cerebrovascular accident)   Sepsis ([place])   SIRS. SIRS criteria on presentation with tachycardia, elevated lactate leukocytosis.  Could be from ongoing nausea and vomiting.  CT scan of the abdomen pelvis without any acute findings.  On cefepime and Flagyl.  MRSA PCR negative.  COVID influenza negative.  MRSA PCR was negative.  COVID and influenza was negative.  Blood cultures pending.  Urine cultures negative at this time.  Patient's son states that in the past she had UTI and was very confused.  Temperature max of 98.5 F.  Leukocytosis has trended  down.  We will discontinue Flagyl.  Continue cefepime for 1 more day  AKI on CKD 3 Baseline creatinine of around 1.2.  Was 1.6 on presentation.  Creatinine of 1.5 today.  Received IV fluids.  Will discontinue for now.  Hypertrophic cardiomyopathy with history of Intermittent second degree heart block Last known 2D echocardiogram with EF 99-24% with G1 diastolic dysfunction and focal findings.  Patient does have history of syncope secondary to intermittent secondary heart block and has plans for pacemaker placement in the future.  Volume has been replenished.  Hydralazine has been added to the regimen due to hypotension.  Cardiology was consulted who has added propranolol as well.  Essential hypertension accelerated at this time.   Was put on hydralazine on admission.  Dose has been adjusted.  Cardiology has seen the patient and added propranolol as well.  Diabetes mellitus type II Continue sliding scale insulin Accu-Cheks, diabetic diet.  Hold oral hypoglycemic agents.  Encouraged oral intake.  Latest POC glucose of 114.  Hypokalemia.  Potassium 3.2.  We will replenish through IV since patient with poor oral intake and nausea.   Hyperlipidemia Continue Crestor  Recent fall, debility weakness.  PT evaluation pending.  Patient's son feels like that she might need rehabilitation.  Right shoulder pain from recent fall.  Continue K pad local analgesic cream Tylenol.  X-ray of the right shoulder without any fracture.  As possible right-sided rotator cuff as per the patient's son who stated that she did have MRI of the shoulder.  Moderate protein calorie malnutrition.  Present on admission.  Dietary on  board.  Continue nutritional supplements.  Change diet to cardiac.   DVT prophylaxis: enoxaparin (LOVENOX) injection 30 mg Start: 05/23/21 0600  Code Status: Full code  Family Communication: Patient's son at bedside.  Status is: Inpatient  The patient is inpatient because: Need for IV  antibiotics, physical therapy evaluation, ongoing shoulder pain, reevaluation and medication adjustment.   Consultants: Cardiology  Procedures: None  Anti-infectives:  Cefepime   Subjective:  Today, patient was seen and examined at bedside.  Complains of mild shoulder pain.  Patient's son at bedside who stated that patient was nauseated fatigued and weak after she was taken off of her Heart medication.  He wishes to be evaluated for pacemaker placement.  No nausea or vomiting.  Poor intake.  Objective: Vitals:   05/24/21 0457 05/24/21 1015  BP: (!) 185/99 (!) 181/115  Pulse: 92 100  Resp:  20  Temp: 98.5 F (36.9 C) (!) 97.4 F (36.3 C)  SpO2: 99% 100%    Intake/Output Summary (Last 24 hours) at 05/24/2021 1140 Last data filed at 05/24/2021 0631 Gross per 24 hour  Intake 60 ml  Output 400 ml  Net -340 ml    Filed Weights   05/23/21 0237  Weight: 61.4 kg   Body mass index is 22.53 kg/m.   Physical Exam: GENERAL: Patient is alert awake and communicative.  Not in obvious distress.  Elderly female. HENT: No scleral pallor or icterus. Pupils equally reactive to light. Oral mucosa is moist NECK: is supple, no gross swelling noted. CHEST: Clear to auscultation. No crackles or wheezes.  Diminished breath sounds bilaterally. CVS: S1 and S2 heard, no murmur.  Tachycardic at this time ABDOMEN: Soft, non-tender, bowel sounds are present. EXTREMITIES: No edema.  Mild tenderness over the right shoulder with restricted movement. CNS: Cranial nerves are intact. No focal motor deficits. SKIN: warm and dry without rashes.  Data Review: I have personally reviewed the following laboratory data and studies,  CBC: Recent Labs  Lab 05/22/21 2030 05/23/21 0448 05/24/21 0426  WBC 17.4* 14.6* 10.6*  NEUTROABS 15.1*  --   --   HGB 16.1* 12.7 12.5  HCT 48.6* 39.5 37.4  MCV 88.2 90.6 87.8  PLT 391 316 606    Basic Metabolic Panel: Recent Labs  Lab 05/22/21 2030  05/22/21 2042 05/23/21 0448 05/24/21 0426  NA 137  --  132* 132*  K 4.4  --  3.9 3.2*  CL 109  --  108 106  CO2 13*  --  13* 15*  GLUCOSE 150*  --  121* 98  BUN 38*  --  33* 29*  CREATININE 1.86*  --  1.70* 1.57*  CALCIUM 10.5*  --  9.1 9.1  MG  --  2.5*  --  2.0    Liver Function Tests: Recent Labs  Lab 05/22/21 2030 05/23/21 0448  AST 19 17  ALT 17 13  ALKPHOS 72 54  BILITOT 0.8 0.8  PROT 8.8* 6.9  ALBUMIN 5.0 3.8    Recent Labs  Lab 05/22/21 2030  LIPASE 26    No results for input(s): AMMONIA in the last 168 hours. Cardiac Enzymes: No results for input(s): CKTOTAL, CKMB, CKMBINDEX, TROPONINI in the last 168 hours. BNP (last 3 results) No results for input(s): BNP in the last 8760 hours.  ProBNP (last 3 results) No results for input(s): PROBNP in the last 8760 hours.  CBG: Recent Labs  Lab 05/23/21 1142 05/23/21 1627 05/23/21 2141 05/24/21 0722 05/24/21 1117  GLUCAP 138* 95  111* 98 114*    Recent Results (from the past 240 hour(s))  Urine Culture     Status: None   Collection Time: 05/23/21 12:30 AM   Specimen: In/Out Cath Urine  Result Value Ref Range Status   Specimen Description   Final    IN/OUT CATH URINE Performed at [facility] [address], [place], [place] [address]    Special Requests   Final    NONE Performed at [facility], [place] [address], [place], [place] [address]    Culture   Final    NO GROWTH Performed at [facility], [place] [address] [place], [place] [address]    Report Status 05/24/2021 FINAL  Final  Surgical pcr screen     Status: Abnormal   Collection Time: 05/23/21 12:49 AM   Specimen: Nasal Mucosa; Nasal Swab  Result Value Ref Range Status   MRSA, PCR NEGATIVE NEGATIVE Final   Staphylococcus aureus POSITIVE (A) NEGATIVE Final    Comment: (NOTE) The Xpert SA Assay (FDA approved for NASAL specimens in patients 39 years of age and older), is one component of a  comprehensive surveillance program. It is not intended to diagnose infection nor to guide or monitor treatment. Performed at [facility], [place] [address], [place], [place] [address]   Resp Panel by RT-PCR (Flu A&B, Covid) Nasal Mucosa     Status: None   Collection Time: 05/23/21 12:49 AM   Specimen: Nasal Mucosa; Nasopharyngeal(NP) swabs in vial transport medium  Result Value Ref Range Status   SARS Coronavirus 2 by RT PCR NEGATIVE NEGATIVE Final    Comment: (NOTE) SARS-CoV-2 target nucleic acids are NOT DETECTED.  The SARS-CoV-2 RNA is generally detectable in upper respiratory specimens during the acute phase of infection. The lowest concentration of SARS-CoV-2 viral copies this assay can detect is 138 copies/mL. A negative result does not preclude SARS-Cov-2 infection and should not be used as the sole basis for treatment or other patient management decisions. A negative result may occur with  improper specimen collection/handling, submission of specimen other than nasopharyngeal swab, presence of viral mutation(s) within the areas targeted by this assay, and inadequate number of viral copies(<138 copies/mL). A negative result must be combined with clinical observations, patient history, and epidemiological information. The expected result is Negative.  Fact Sheet for Patients:  EntrepreneurPulse.com.au  Fact Sheet for Healthcare Providers:  IncredibleEmployment.be  This test is no t yet approved or cleared by the [place] FDA and  has been authorized for detection and/or diagnosis of SARS-CoV-2 by FDA under an Emergency Use Authorization (EUA). This EUA will remain  in effect (meaning this test can be used) for the duration of the COVID-19 declaration under Section 564(b)(1) of the Act, 21 U.S.C.section 360bbb-3(b)(1), unless the authorization is terminated  or revoked sooner.       Influenza A by PCR NEGATIVE  NEGATIVE Final   Influenza B by PCR NEGATIVE NEGATIVE Final    Comment: (NOTE) The Xpert Xpress SARS-CoV-2/FLU/RSV plus assay is intended as an aid in the diagnosis of influenza from Nasopharyngeal swab specimens and should not be used as a sole basis for treatment. Nasal washings and aspirates are unacceptable for Xpert Xpress SARS-CoV-2/FLU/RSV testing.  Fact Sheet for Patients: EntrepreneurPulse.com.au  Fact Sheet for Healthcare Providers: IncredibleEmployment.be  This test is not yet approved or cleared by the [place] FDA and has been authorized for detection and/or diagnosis of SARS-CoV-2 by FDA under an Emergency  Use Authorization (EUA). This EUA will remain in effect (meaning this test can be used) for the duration of the COVID-19 declaration under Section 564(b)(1) of the Act, 21 U.S.C. section 360bbb-3(b)(1), unless the authorization is terminated or revoked.  Performed at [facility], [place] [address] [place], [place] [address]       Studies: CT ABDOMEN PELVIS WO CONTRAST  Result Date: 05/22/2021 CLINICAL DATA:  Nausea/vomiting, altered mental status EXAM: CT ABDOMEN AND PELVIS WITHOUT CONTRAST TECHNIQUE: Multidetector CT imaging of the abdomen and pelvis was performed following the standard protocol without IV contrast. COMPARISON:  10/09/2020 FINDINGS: Lower chest: Mild subpleural scarring in the medial right lower lobe. Hepatobiliary: Unenhanced liver is unremarkable. Gallbladder is unremarkable. No intrahepatic or extrahepatic duct dilatation. Pancreas: Within normal limits. Spleen: Within normal limits. Adrenals/Urinary Tract: Adrenal glands are within normal limits. 2.7 cm medial left upper pole renal lesion with macroscopic fat, unchanged, compatible with a benign renal angiomyolipoma. Additional 3.1 cm left lower pole renal cyst. Right kidney is within normal limits. No hydronephrosis. Bladder is within  normal limits. Stomach/Bowel: Stomach is notable for a moderate hiatal hernia. No evidence of bowel obstruction. Appendix is not discretely visualized. Sigmoid diverticulosis, without evidence of diverticulitis. Vascular/Lymphatic: No evidence of abdominal aortic aneurysm. Atherosclerotic calcifications of the abdominal aorta and branch vessels. No suspicious abdominopelvic lymphadenopathy. Reproductive: Status post hysterectomy. No adnexal masses. Other: No abdominopelvic ascites. Musculoskeletal: Grade 2 anterolisthesis of L5 on S1. Mild degenerative changes of the lower lumbar spine. IMPRESSION: Sigmoid diverticulosis, without evidence of diverticulitis. Moderate hiatal hernia. Stable left renal angiomyolipoma. Electronically Signed   By: Julian Hy M.D.   On: 05/22/2021 22:50   CT Head Wo Contrast  Result Date: 05/22/2021 CLINICAL DATA:  Head trauma EXAM: CT HEAD WITHOUT CONTRAST TECHNIQUE: Contiguous axial images were obtained from the base of the skull through the vertex without intravenous contrast. COMPARISON:  CT brain 05/09/2021, 03/31/2021 FINDINGS: Brain: No acute territorial infarction, hemorrhage or intracranial mass. Advanced atrophy. Advanced chronic small vessel ischemic changes of the white matter. Stable ventricle size. Vascular: No hyperdense vessels.  Carotid vascular calcification Skull: Normal. Negative for fracture or focal lesion. Sinuses/Orbits: No acute finding. Other: None IMPRESSION: 1. No CT evidence for acute intracranial abnormality. 2. Atrophy and chronic small vessel ischemic changes of the white matter Electronically Signed   By: Donavan Foil M.D.   On: 05/22/2021 19:56   DG Chest Port 1 View  Result Date: 05/22/2021 CLINICAL DATA:  Nausea vomiting EXAM: PORTABLE CHEST 1 VIEW COMPARISON:  05/09/2021 FINDINGS: The heart size and mediastinal contours are within normal limits. Aortic atherosclerosis. Both lungs are clear. The visualized skeletal structures are  unremarkable. IMPRESSION: No active disease. Electronically Signed   By: Donavan Foil M.D.   On: 05/22/2021 19:57      Flora Lipps, MD  [facility] 05/24/2021  If 7PM-7AM, please contact night-coverage

## 2021-05-24 NOTE — TOC Progression Note (Signed)
Transition of Care [facility]) - Progression Note    Patient Details  Name: Monie Shere MRN: [id number] Date of Birth: 04-29-1935  Transition of Care [facility]) CM/SW Contact  Ross Ludwig, [place] Phone Number: 05/24/2021, 2:42 PM  Clinical Narrative:    Patient's family is requesting short term rehab.  Patient was recently admitted to [facility], son could not remember how many days she was there and what her insurance approved for her.  CSW explained that patient will have to have a 60 day wellness period for her 20 days to start back over.  CSW contacted [facility] and they will check on how many days she used.  CSW was given permission to begin bed search in [facility].

## 2021-05-24 NOTE — NC FL2 (Signed)
[place] LEVEL OF CARE SCREENING TOOL     IDENTIFICATION  Patient Name: Anna Hoffman Birthdate: [birth year] Sex: female Admission Date (Current Location): 05/22/2021  [facility] and [place] Number:  Herbalist and Address:  [facility],  [place], [place]      Provider Number: [id number]  Attending Physician Name and Address:  Flora Lipps, MD  Relative Name and Phone Number:  Little Ishikawa [phone]  [phone]  Burna Mortimer [phone]  [phone]  Riki Altes Granddaughter   [phone]    Current Level of Care: Hospital Recommended Level of Care: [place] Prior Approval Number:    Date Approved/Denied:   PASRR Number: [id number]  Discharge Plan: SNF    Current Diagnoses: Patient Active Problem List   Diagnosis Date Noted   Malnutrition of moderate degree 05/24/2021   Sepsis ([place]) 05/23/2021   SIRS (systemic inflammatory response syndrome) ([place]) 05/22/2021   History of CVA (cerebrovascular accident) 05/22/2021   Gait instability 04/24/2021   Seasonal allergic rhinitis due to pollen 04/24/2021   Ascending aorta dilatation ([place]) 04/05/2021   HOCM (hypertrophic obstructive cardiomyopathy) ([place]) 04/04/2021   Physical deconditioning 04/04/2021   Syncope 04/03/2021   Fall at home, initial encounter 03/31/2021   Thyroid nodule 03/31/2021   CAP (community acquired pneumonia) 11/15/2020   Dehydration 11/15/2020   Erosive esophagitis    Hiatal hernia    Hyponatremia 10/10/2020   Acute kidney injury superimposed on chronic kidney disease ([place]) 10/10/2020   Aortic atherosclerosis ([place]) 10/09/2020   Acute renal failure superimposed on stage 3a chronic kidney disease ([place]) 10/09/2020   Hyperkalemia 10/09/2020   Hearing loss 10/09/2020   Prolonged QT interval 10/09/2020   Colitis, acute 08/11/2019   Colitis 07/11/2019   Loose stools 06/07/2019   Herpes zoster without  complication 95/63/8756   Other long term (current) drug therapy 08/12/2018   Chronic kidney disease, stage 3a ([place]) 07/19/2018   Hypertensive nephropathy 07/19/2018   Community acquired pneumonia of right upper lobe of lung 11/07/2017   Hypertensive emergency 07/01/2016   Thalamic hemorrhage ([place]) 07/01/2016   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[place])    Benign essential HTN    Type 2 diabetes mellitus with stage 3 chronic kidney disease, without long-term current use of insulin ([place])    Mixed hyperlipidemia    ICH (intracerebral hemorrhage) ([place]) - hypertensive R thalamic hemorrhage  06/27/2016   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   Retroperitoneal bleed 02/08/2016   Generalized abdominal pain    Gastroenteritis 02/04/2016   Diarrhea 02/04/2016   Hypokalemia 02/04/2016   Incarcerated paraesophageal hernia 09/02/2014   Renal mass, left 09/02/2014   Acute esophagitis 09/02/2014   Gastric outlet obstruction 09/02/2014   Hypertension    Vomiting 09/01/2014    Orientation RESPIRATION BLADDER Height & Weight     Self, Time, Situation, Place  Normal Incontinent Weight: 135 lb 5.8 oz (61.4 kg) Height:  5\' 5"  (165.1 cm)  BEHAVIORAL SYMPTOMS/MOOD NEUROLOGICAL BOWEL NUTRITION STATUS      Incontinent Diet (cardiac)  AMBULATORY STATUS COMMUNICATION OF NEEDS Skin   Limited Assist Verbally Normal                       Personal Care Assistance Level of Assistance  Bathing, Feeding, Dressing Bathing Assistance: Limited assistance Feeding assistance: Limited assistance Dressing Assistance: Limited assistance     Functional Limitations Info  Sight, Hearing, Speech Sight Info: Adequate Hearing Info: Adequate Speech Info: Adequate  SPECIAL CARE FACTORS FREQUENCY  PT (By licensed PT), OT (By licensed OT)     PT Frequency: Minimum 5x a week OT Frequency: Minimum 5x a week            Contractures Contractures Info: Not present    Additional Factors Info  Code  Status, Allergies, Psychotropic, Insulin Sliding Scale Code Status Info: Full Code Allergies Info: Amoxicillin   Ampicillin   Sulfa Antibiotics Psychotropic Info: propranolol (INDERAL) tablet 10 mg Insulin Sliding Scale Info: insulin aspart (novoLOG) injection 0-9 Units 3x a day with meals       Current Medications (05/24/2021):  This is the current hospital active medication list Current Facility-Administered Medications  Medication Dose Route Frequency Provider Last Rate Last Admin   acetaminophen (TYLENOL) tablet 650 mg  650 mg Oral Q6H PRN Marcelyn Bruins, MD   650 mg at 05/23/21 2706   Or   acetaminophen (TYLENOL) suppository 650 mg  650 mg Rectal Q6H PRN Marcelyn Bruins, MD       ceFEPIme (MAXIPIME) 2 g in sodium chloride 0.9 % 100 mL IVPB  2 g Intravenous Q24H Angela Adam, [place] at 05/23/21 2300   diclofenac Sodium (VOLTAREN) 1 % topical gel 2 g  2 g Topical QID Pokhrel, Laxman, MD   2 g at 05/24/21 1326   enoxaparin (LOVENOX) injection 30 mg  30 mg Subcutaneous Q24H Marcelyn Bruins, MD   30 mg at 05/24/21 0500   feeding supplement (KATE FARMS STANDARD 1.4) liquid 325 mL  325 mL Oral BID BM Pokhrel, Laxman, MD       hydrALAZINE (APRESOLINE) injection 10 mg  10 mg Intravenous Q6H PRN Pokhrel, Laxman, MD       hydrALAZINE (APRESOLINE) tablet 50 mg  50 mg Oral Q8H Josue Hector, MD   50 mg at 05/24/21 1138   insulin aspart (novoLOG) injection 0-9 Units  0-9 Units Subcutaneous TID WC Marcelyn Bruins, MD   1 Units at 05/23/21 1242   multivitamin with minerals tablet 1 tablet  1 tablet Oral Daily Pokhrel, Laxman, MD       mupirocin ointment (BACTROBAN) 2 % 1 application  1 application Nasal BID Pokhrel, Laxman, MD   1 application at 23/76/28 3151   Muscle Rub CREA 1 application  1 application Topical Daily PRN Marcelyn Bruins, MD       pantoprazole (PROTONIX) EC tablet 40 mg  40 mg Oral BID Marcelyn Bruins, MD   40 mg at 05/24/21 0932   polyethylene  glycol (MIRALAX / GLYCOLAX) packet 17 g  17 g Oral Daily PRN Marcelyn Bruins, MD       polyvinyl alcohol (LIQUIFILM TEARS) 1.4 % ophthalmic solution 1 drop  1 drop Both Eyes PRN Marcelyn Bruins, MD       potassium chloride 10 mEq in 100 mL IVPB  10 mEq Intravenous Q1 Hr x 4 Pokhrel, Laxman, MD 100 mL/hr at 05/24/21 1325 10 mEq at 05/24/21 1325   propranolol (INDERAL) tablet 10 mg  10 mg Oral BID Josue Hector, MD   10 mg at 05/24/21 1306   rosuvastatin (CRESTOR) tablet 20 mg  20 mg Oral Daily Marcelyn Bruins, MD   20 mg at 05/24/21 0934   sodium chloride flush (NS) 0.9 % injection 3 mL  3 mL Intravenous Q12H Marcelyn Bruins, MD   3 mL at 05/24/21 7616     Discharge Medications: Please see discharge summary  for a list of discharge medications.  Relevant Imaging Results:  Relevant Lab Results:   Additional Information SSN [id number]  Ross Ludwig, LCSW

## 2021-05-24 NOTE — Consult Note (Signed)
[facility] Inpatient Consult   05/24/2021  Anna Hoffman [facility] Dec 10, 1934 [id number]  [place] [facility]   Patient was screened for [place] Management services due to high risk score for unplanned readmission. Per review, patient active with embedded chronic care management service at primary provider office.   Plan: Notification sent to the [place] Management and made aware of current disposition. Will continue to follow for progression.  Of note, [facility] Care Management services does not replace or interfere with any services that are arranged by inpatient case management or social work.   Netta Cedars, MSN, [place] [facility] Liaison Nurse Mobile Phone [phone]  Toll free office [phone]

## 2021-05-24 NOTE — Evaluation (Signed)
Physical Therapy Evaluation Patient Details Name: Anna Hoffman MRN: [id number] DOB: 01/03/1935 Today's Date: 05/24/2021  History of Present Illness  Pt is a 86 y.o. female presented to hospital with nausea vomiting and weakness for 2 to 3 days with difficulty ambulating and confusion for few weeks. admitted for SIRS (systemic inflammatory response syndrome). She also had a fall with some trauma to the right shoulder for which she was receiving HHPT 2x/wk. PMH significant for esophagitis, CKD stage IIIa, hypertension, colitis, hypertrophic cardiomyopathy and intracerebral hemorrhage, diabetes.  Clinical Impression  Pt is an 86 y.o. female with above HPI resulting in the deficits listed below (see PT Problem List). Pt confused throughout session and son present at beginning to provide home environment and some PLOF. Pt was requiring assist with mobility recently due to weakness. Pt limited with mobility today due to HTN during session (see general comments below). Pt performed sit to stand transfers with MOD A for power up and able to ambulate ~39ft taking fwd/lateral steps to recliner. Pt required repeated cues throughout session for sequencing/problem solving and proper/safe technique. Pt lives with her son who is available to assist intermittently as he works often. Recommend SNF at this time as pt is currently below baseline mobility level requiring increased assist and 2 for safety. Pt  will benefit from continued skilled PT to maximize functional mobility and increase independence.         Recommendations for follow up therapy are one component of a multi-disciplinary discharge planning process, led by the attending physician.  Recommendations may be updated based on patient status, additional functional criteria and insurance authorization.  Follow Up Recommendations  SNF (may update if pt progresses)    Equipment Recommendations  None recommended by PT (pt owns RW per son)     Recommendations for Other Services  General Comments       General comments (skin integrity, edema, etc.): Pt's BP in supine beginning of session 181/163mmHg. RN notified and present. BP sitting EOB 197/171mmHg. RN contacting MD during session and MD approved mobility to chair. BP following transfer to recliner chair 210/136mmHg, HR 130bpm. BP reading 168/141mmHg, HR 100bpm at end of session. Pt did report onset of headache, but denied headache shortly after, RN notified.          Precautions / Restrictions Precautions Precautions: Fall Restrictions Weight Bearing Restrictions: No      Mobility  Bed Mobility Overal bed mobility: Needs Assistance Bed Mobility: Supine to Sit     Supine to sit: Min assist     General bed mobility comments: HOB slightly elevated. MIN A to bring trunk to upright    Transfers Overall transfer level: Needs assistance Equipment used: Rolling walker (2 wheeled) Transfers: Sit to/from Stand Sit to Stand: +2 safety/equipment;Mod assist         General transfer comment: MOD A for power up to stand with cues for forward weight shifting, hand placement and 1,2,3 count to rise. +2 provided for safety. hand over hand facilitation for proper/safe hand placement on RW grips, pt holding onto bedrail in standing.  Ambulation/Gait Ambulation/Gait assistance: Min assist;+2 safety/equipment Gait Distance (Feet): 3 Feet Assistive device: Rolling walker (2 wheeled) Gait Pattern/deviations: Step-through pattern;Decreased stride length;Shuffle;Narrow base of support Gait velocity: decr   General Gait Details: pt able to take fwd steps and then side steps to recliner chair requiring MIN physical assist for stability and up to MOD cuing for sequencing of steps, manual assist for RW management of walker, and  repeated cues for problem solving with transfers, and to maintain B UEs on RW. Pt consistently reaching for walls when attempting to get to recliner  chiar.  Stairs            Wheelchair Mobility    Modified Rankin (Stroke Patients Only)       Balance Overall balance assessment: Needs assistance Sitting-balance support: Feet supported Sitting balance-Leahy Scale: Fair     Standing balance support: Bilateral upper extremity supported;During functional activity Standing balance-Leahy Scale: Poor Standing balance comment: reliant on external support                             Pertinent Vitals/Pain Pain Assessment: Faces Faces Pain Scale: Hurts even more Pain Location: Rt shoulder Pain Descriptors / Indicators: Guarding;Grimacing;Other (Comment) (pt with difficulty describing how she is feeling) Pain Intervention(s): Limited activity within patient's tolerance;Monitored during session;Repositioned    Home Living Family/patient expects to be discharged to:: Private residence Living Arrangements: Children Available Help at Discharge: Family;Available PRN/intermittently Type of Home: House Home Access: Stairs to enter Entrance Stairs-Rails: Right;Left;Can reach both Entrance Stairs-Number of Steps: 3 Home Layout: One level Home Equipment: Shower seat;Bedside commode;Grab bars - tub/shower;Walker - 2 wheels;Cane - quad Additional Comments: Pt's son present at beginning of session and providing home environment and PLOF. Son works Company secretary so home alone intermittently. Pt's son open and with goal of pt receiving more short term rehab stay prior to returning home.    Prior Function Level of Independence: Needs assistance   Gait / Transfers Assistance Needed: walks with RW, unsure last time pt has been ambulating at home. Pt's son reports he has been having to physically assist her with mobility.  ADL's / Homemaking Assistance Needed: States pt needs assist for bathing, but is able to brush teeth and dress with set up assistance.  Comments: Pt states "no, I get around well with my walker" when asked if son has  been helping her with moving/walking at home.     Hand Dominance   Dominant Hand: Right    Extremity/Trunk Assessment   Upper Extremity Assessment Upper Extremity Assessment: Defer to OT evaluation    Lower Extremity Assessment Lower Extremity Assessment: RLE deficits/detail;LLE deficits/detail RLE Deficits / Details: grossly 4-/5 throughout LLE Deficits / Details: grossly 4-/5 throughout    Cervical / Trunk Assessment Cervical / Trunk Assessment: Normal  Communication   Communication: No difficulties  Cognition Arousal/Alertness: Awake/alert Behavior During Therapy: WFL for tasks assessed/performed;Flat affect Overall Cognitive Status: Impaired/Different from baseline Area of Impairment: Orientation;Following commands;Problem solving                 Orientation Level: Place;Time;Situation     Following Commands: Follows one step commands inconsistently     Problem Solving: Requires verbal cues;Requires tactile cues;Difficulty sequencing General Comments: pt required multiple verbal/tactile cues for proper techniques, command following and progression of mobility. Pt perseverating on current situation stating she does not know why she is here and then unable to recall that she is in the hospital when asked where she is, despite multiple reorientation attempts to "[facility] in [place]" by therapist. Oriented to month but stating repeatedly stating "october" when asked what year it is.         Exercises     Assessment/Plan    PT Assessment Patient needs continued PT services  PT Problem List Decreased strength;Decreased range of motion;Decreased activity tolerance;Decreased balance;Decreased mobility;Decreased safety awareness;Decreased knowledge  of use of DME;Pain;Cardiopulmonary status limiting activity       PT Treatment Interventions DME instruction;Gait training;Stair training;Functional mobility training;Therapeutic activities;Therapeutic  exercise;Balance training;Patient/family education    PT Goals (Current goals can be found in the Care Plan section)  Acute Rehab PT Goals Patient Stated Goal: Move more, feel better- "I feel weak" PT Goal Formulation: With patient/family Time For Goal Achievement: 06/07/21 Potential to Achieve Goals: Good    Frequency Min 2X/week   Barriers to discharge        Co-evaluation               AM-PAC PT "6 Clicks" Mobility  Outcome Measure Help needed turning from your back to your side while in a flat bed without using bedrails?: A Little Help needed moving from lying on your back to sitting on the side of a flat bed without using bedrails?: A Little Help needed moving to and from a bed to a chair (including a wheelchair)?: A Lot Help needed standing up from a chair using your arms (e.g., wheelchair or bedside chair)?: A Lot Help needed to walk in hospital room?: A Little Help needed climbing 3-5 steps with a railing? : A Lot 6 Click Score: 15    End of Session Equipment Utilized During Treatment: Gait belt Activity Tolerance: Patient tolerated treatment well;Treatment limited secondary to medical complications (Comment) (HTN) Patient left: in chair;with call bell/phone within reach;with chair alarm set;with nursing/sitter in room Nurse Communication: Mobility status (BP readings, Radio producer, Griffin Dakin present throughout. RN Joellen Jersey present intermittently during session.) PT Visit Diagnosis: Unsteadiness on feet (R26.81);Muscle weakness (generalized) (M62.81);History of falling (Z91.81)    Time: 0165-5374 PT Time Calculation (min) (ACUTE ONLY): 46 min   Charges:   PT Evaluation $PT Eval Low Complexity: 1 Low PT Treatments $Therapeutic Activity: 23-37 mins        Festus Barren PT, DPT  Acute Rehabilitation Services  Office [phone]  05/24/2021, 11:52 AM

## 2021-05-24 NOTE — Evaluation (Signed)
Occupational Therapy Evaluation Patient Details Name: Anna Hoffman MRN: [id number] DOB: 06/08/1935 Today's Date: 05/24/2021   History of Present Illness Pt is a 86 y.o. female presented to hospital with nausea vomiting and weakness for 2 to 3 days with difficulty ambulating and confusion for few weeks. admitted for SIRS (systemic inflammatory response syndrome). She also had a fall with some trauma to the right shoulder for which she was receiving HHPT 2x/wk. PMH significant for esophagitis, CKD stage IIIa, hypertension, colitis, hypertrophic cardiomyopathy and intracerebral hemorrhage, diabetes.   Clinical Impression   Anna Hoffman is an 86 year old woman who presents with impaired cognition, generalized weakness, impaired balance and poor activity tolerance resulting in a recent decline in her ability to perform BADLs and mobility. Patient requires mod assist to perform sit to stand and min assist to take steps requiring verbal cue to sequence each step. Patient total assist for toileting, LB bathing and dressing after episode of BM incontinence. Patient will benefit from skilled OT services while in hospital to improve deficits and improve functional abilities to reduce caregiver burden.       Recommendations for follow up therapy are one component of a multi-disciplinary discharge planning process, led by the attending physician.  Recommendations may be updated based on patient status, additional functional criteria and insurance authorization.   Follow Up Recommendations  SNF    Equipment Recommendations  None recommended by OT    Recommendations for Other Services       Precautions / Restrictions Precautions Precautions: Fall Restrictions Weight Bearing Restrictions: No      Mobility Bed Mobility Overal bed mobility: Needs Assistance Bed Mobility: Sit to Supine       Sit to supine: Mod assist   General bed mobility comments: Mod assist for LEs to transfer back in to  bed.    Transfers Overall transfer level: Needs assistance Equipment used: Rolling walker (2 wheeled) Transfers: Sit to/from Stand Sit to Stand: Mod assist         General transfer comment: Mod assist to stand from recliner and BSC. Min assist to take steps to [facility] and then to head of bed with RW. Verbal cue for each step required.    Balance Overall balance assessment: Needs assistance Sitting-balance support: No upper extremity supported Sitting balance-Leahy Scale: Fair     Standing balance support: Bilateral upper extremity supported;During functional activity Standing balance-Leahy Scale: Poor Standing balance comment: reliant on external support                           ADL either performed or assessed with clinical judgement   ADL Overall ADL's : Needs assistance/impaired Eating/Feeding: Set up;Minimal assistance;Cueing for sequencing;Sitting   Grooming: Maximal assistance;Set up;Sitting   Upper Body Bathing: Maximal assistance;Sitting   Lower Body Bathing: Maximal assistance;Total assistance   Upper Body Dressing : Maximal assistance;Sitting   Lower Body Dressing: Total assistance;Sit to/from stand   Toilet Transfer: Minimal assistance;BSC;Stand-pivot;RW   Toileting- Clothing Manipulation and Hygiene: Total assistance;Sit to/from stand       Functional mobility during ADLs: Minimal assistance;Rolling walker       Vision   Vision Assessment?: No apparent visual deficits     Perception     Praxis      Pertinent Vitals/Pain Pain Assessment: Faces Faces Pain Scale: Hurts a little bit Pain Location: Rt shoulder Pain Descriptors / Indicators: Grimacing;Sore Pain Intervention(s): Limited activity within patient's tolerance  Hand Dominance Right   Extremity/Trunk Assessment Upper Extremity Assessment Upper Extremity Assessment: RUE deficits/detail;LUE deficits/detail;Difficult to assess due to impaired cognition RUE Deficits /  Details: Impaired shoulder ROM and limited by pain - able to passively range to 90 degrees otherwise grossly functional ROM. Unable to perform MMT due to impaired cognition. At least 3/5 strength. LUE Deficits / Details: Unable to perform MMT due to impaired cognition. At least 3/5 strength.   Lower Extremity Assessment Lower Extremity Assessment: Defer to PT evaluation   Cervical / Trunk Assessment Cervical / Trunk Assessment: Normal   Communication Communication Communication: No difficulties   Cognition Arousal/Alertness: Awake/alert Behavior During Therapy: WFL for tasks assessed/performed Overall Cognitive Status: History of cognitive impairments - at baseline                                 General Comments: Patietn able to state name and that she is at [facility]. Her son reports a decline in cogntiion for the last couple of months stating "sometimes she doesn't even know me."   General Comments       Exercises     Shoulder Instructions      Home Living Family/patient expects to be discharged to:: Private residence Living Arrangements: Children Available Help at Discharge: Family;Available PRN/intermittently Type of Home: House Home Access: Stairs to enter [employer] of Steps: 3 Entrance Stairs-Rails: Right;Left;Can reach both Home Layout: One level     Bathroom Shower/Tub: Teacher, early years/pre: Standard Bathroom Accessibility: Yes   Home Equipment: Shower seat;Bedside commode;Grab bars - tub/shower;Walker - 2 wheels;Cane - quad   Additional Comments: Pt's son present at beginning of session and providing home environment and PLOF. Son works Company secretary so home alone intermittently. Pt's son open and with goal of pt receiving more short term rehab stay prior to returning home.      Prior Functioning/Environment Level of Independence: Needs assistance  Gait / Transfers Assistance Needed: walks with RW, unsure last time pt has  been ambulating at home. Pt's son reports he has been having to physically assist her with mobility. ADL's / Homemaking Assistance Needed: States pt needs assist for bathing, but is able to brush teeth and dress with set up assistance. A caregiver comes in a couple of times a week as well to assist with ADLs.   Comments: Pt states "no, I get around well with my walker" when asked if son has been helping her with moving/walking at home. Second present during OT evaluation and reports she has had a significant decline with cogntiion and memory in the last several months.        OT Problem List: Decreased strength;Decreased range of motion;Decreased activity tolerance;Impaired balance (sitting and/or standing);Decreased cognition;Decreased safety awareness;Decreased knowledge of use of DME or AE;Pain;Impaired UE functional use      OT Treatment/Interventions: Self-care/ADL training;Therapeutic exercise;DME and/or AE instruction;Cognitive remediation/compensation;Therapeutic activities;Balance training;Patient/family education    OT Goals(Current goals can be found in the care plan section) Acute Rehab OT Goals Patient Stated Goal: to get sronger and need less assistance OT Goal Formulation: With family Time For Goal Achievement: 06/07/21 Potential to Achieve Goals: Fair  OT Frequency: Min 2X/week   Barriers to D/C:            Co-evaluation              AM-PAC OT "6 Clicks" Daily Activity     Outcome Measure  Help from another person eating meals?: A Little Help from another person taking care of personal grooming?: A Lot Help from another person toileting, which includes using toliet, bedpan, or urinal?: Total Help from another person bathing (including washing, rinsing, drying)?: A Lot Help from another person to put on and taking off regular upper body clothing?: A Lot Help from another person to put on and taking off regular lower body clothing?: Total 6 Click Score: 11   End  of Session Equipment Utilized During Treatment: Rolling walker;Gait belt Nurse Communication: Mobility status  Activity Tolerance: Patient tolerated treatment well Patient left: in bed;with call bell/phone within reach;with nursing/sitter in room  OT Visit Diagnosis: Unsteadiness on feet (R26.81);Muscle weakness (generalized) (M62.81);Pain                Time: 1430-1449 OT Time Calculation (min): 19 min Charges:  OT General Charges $OT Visit: 1 Visit OT Evaluation $OT Eval Low Complexity: 1 Low  Ariv Penrod, OTR/L [place]  Office [phone] Pager: [place] 05/24/2021, 4:11 PM

## 2021-05-24 NOTE — Progress Notes (Signed)
Initial Nutrition Assessment  DOCUMENTATION CODES:   Non-severe (moderate) malnutrition in context of chronic illness  INTERVENTION:  - will d/c Ensure. - will order Dillard [place] 1.4 po BID, each supplement provides 455 kcal and 20 grams protein. - will order 1 tablet multivitamin with minerals/day.  - MD changing diet from Heart Healthy/Carb Modified to Heart Healthy.    NUTRITION DIAGNOSIS:   Moderate Malnutrition related to chronic illness as evidenced by moderate fat depletion, moderate muscle depletion, severe muscle depletion.  GOAL:   Patient will meet greater than or equal to 90% of their needs  MONITOR:   PO intake, Supplement acceptance, Labs, Weight trends  REASON FOR ASSESSMENT:   Malnutrition Screening Tool  ASSESSMENT:   86 y.o. female with medical history of esophagitis, stage 3 CKD, HTN, colitis, hypertrophic cardiomyopathy and intracerebral hemorrhage, and DM. She presented to the ED due to N/V and weakness for 2-3 days and confusion and difficulty ambulating for several weeks. CXR, CT head, and CT pelvis were negative except for L renal angiomyolipoma.  Patient noted in flow sheet to be a/o to self only and that she ate 5% of dinner last night.   At the time of visit she was sitting in the chair and one of her sons (who lives in [place]) was at bedside. He provided all of the information during visit.  Patient began to experience symptoms on Monday (10/17) and was unable to eat or drink and keep it down from that time until being brought to the hospital on Wednesday (10/19). No report of symptoms today.   Patient's appetite has overall been decreased for several months with some days being better or worse than others.   In the past she had been drinking Ensure at home, but PCP had encouraged patient to move away from supplements and focus on intakes of foods.   Talked with son about oral nutrition supplements and about alterative options here. He is  receive to having her try [employer].   Son reports that each time he sees patient she looks slightly different to him, like she has lost weight each time.  Weight yesterday was 135 lb and weight appears to have been fairly stable (133-138 lb) from 11/29/20-yesterday.    Labs reviewed; CBGs: 98 and 114 mg/dl, Na: 132 mmol/l, K: 3.2 mmol/l, BUN: 29 mg/dl, creatinine: 1.57 mg/dl, GFR: 32 ml/min. Medications reviewed; sliding scale novolog, 40 mg oral protonix BID, 10 mEq IV KCl x4 runs 10/21.    NUTRITION - FOCUSED PHYSICAL EXAM:  Flowsheet Row Most Recent Value  Orbital Region Moderate depletion  Upper Arm Region Moderate depletion  Thoracic and Lumbar Region Unable to assess  Buccal Region Moderate depletion  Temple Region Moderate depletion  Clavicle Bone Region Moderate depletion  Clavicle and Acromion Bone Region Severe depletion  Scapular Bone Region Moderate depletion  Dorsal Hand Mild depletion  Patellar Region Moderate depletion  Anterior Thigh Region Severe depletion  Posterior Calf Region Severe depletion  Edema (RD Assessment) None  Hair Reviewed  Eyes Reviewed  Mouth Reviewed  Skin Reviewed  Nails Reviewed       Diet Order:   Diet Order             Diet heart healthy/carb modified Room service appropriate? Yes; Fluid consistency: Thin  Diet effective now                   EDUCATION NEEDS:   Education needs have been addressed  Skin:  Skin  Assessment: Reviewed RN Assessment  Last BM:  10/20 (type 5)  Height:   Ht Readings from Last 1 Encounters:  05/23/21 5\' 5"  (1.651 m)    Weight:   Wt Readings from Last 1 Encounters:  05/23/21 61.4 kg    Estimated Nutritional Needs:  Kcal:  1540-1720 kcal Protein:  75-90 grams Fluid:  >/= 1.7 L/day     Jarome Matin, MS, RD, LDN, CNSC Inpatient Clinical Dietitian RD pager # available in [facility]  After hours/weekend pager # available in [facility]

## 2021-05-24 NOTE — Consult Note (Signed)
CARDIOLOGY CONSULT NOTE       Patient ID: Anna Hoffman MRN: [id number] DOB/AGE: 1935/04/15 86 y.o.  Admit date: 05/22/2021 Referring Physician: Pokhrel Primary Physician: Glendale Chard, MD Primary Cardiologist: Schumann/Taylor Reason for Consultation: Tachy/Brady  Principal Problem:   SIRS (systemic inflammatory response syndrome) ([place]) Active Problems:   Benign essential HTN   Type 2 diabetes mellitus with stage 3 chronic kidney disease, without long-term current use of insulin ([name])   Mixed hyperlipidemia   Acute renal failure superimposed on stage 3a chronic kidney disease ([name])   Prolonged QT interval   Dehydration   HOCM (hypertrophic obstructive cardiomyopathy) ([place])   History of CVA (cerebrovascular accident)   Sepsis ([place])   HPI:  86 y.o. admitted with nausea,vomiting, weakness and confusion Frequent falls with recent right shoulder injury No focal infectious signs but increased urinary frequency W/U so far negative CT head CT abdomen and CXR Lactic acid was 2 With mild leukocytosis 11.3 urine pending. She has history of severe septal hypertrophy with no SAM or LVOT gradient not clear to me if this is senile sigmoid hypertrophy with HTN, HOCM non obstructive or amyloid. She has had syncope with baseline ECG first degree and documented type 2 AV block She has seen Dr Lovena Le who had planned on ILR and indicated would likely need PPM. She had her beta blockers and HTN meds stopped. She is currently hypertensive and tachycardic Telemetry review shows atrial tach vs flutter. Currently no high grade AV block ECG with ST first degree AV block  ROS All other systems reviewed and negative except as noted above  Past Medical History:  Diagnosis Date   Arthritis    Diabetes mellitus without complication ([name])    Hypertension    Hypokalemia    Pneumonia    Shingles    Stroke ([place])     Family History  Problem Relation Age of Onset   Alzheimer's disease Mother    Prostate  cancer Father    Brain cancer Brother    Brain cancer Brother    Pancreatic cancer Sister    Allergic rhinitis Neg Hx    Asthma Neg Hx    Eczema Neg Hx    Urticaria Neg Hx     Social History   Socioeconomic History   Marital status: Widowed    Spouse name: Not on file   Number of children: 2   Years of education: Not on file   Highest education level: Not on file  Occupational History   Occupation: retired  Tobacco Use   Smoking status: Never   Smokeless tobacco: Never  Vaping Use   Vaping Use: Never used  Substance and Sexual Activity   Alcohol use: No   Drug use: No   Sexual activity: Not Currently    Birth control/protection: Surgical  Other Topics Concern   Not on file  Social History Narrative   Son [name] lives with her   Social Determinants of Health   Financial Resource Strain: Low Risk    Difficulty of Paying Living Expenses: Not hard at all  Food Insecurity: No Food Insecurity   Worried About Charity fundraiser in the Last Year: Never true   Arboriculturist in the Last Year: Never true  Transportation Needs: No Transportation Needs   Lack of Transportation (Medical): No   Lack of Transportation (Non-Medical): No  Physical Activity: Sufficiently Active   Days of Exercise per Week: 6 days   Minutes of Exercise per Session: 30  min  Stress: No Stress Concern Present   Feeling of Stress : Not at all  Social Connections: Not on file  Intimate Partner Violence: Not on file    Past Surgical History:  Procedure Laterality Date   ABDOMINAL HYSTERECTOMY     BREAST EXCISIONAL BIOPSY Left    ESOPHAGOGASTRODUODENOSCOPY N/A 09/01/2014   Procedure: ESOPHAGOGASTRODUODENOSCOPY (EGD);  Surgeon: Lear Ng, MD;  Location: [facility];  Service: Endoscopy;  Laterality: N/A;   ESOPHAGOGASTRODUODENOSCOPY (EGD) WITH PROPOFOL N/A 10/12/2020   Procedure: ESOPHAGOGASTRODUODENOSCOPY (EGD) WITH PROPOFOL;  Surgeon: Mauri Pole, MD;  Location: [facility];   Service: Endoscopy;  Laterality: N/A;   HIATAL HERNIA REPAIR N/A 09/04/2014   Procedure: LAPAROSCOPIC REPAIR OF HIATAL HERNIA;  Surgeon: Excell Seltzer, MD;  Location: [facility];  Service: General;  Laterality: N/A;  With MESH   IR GENERIC HISTORICAL  04/10/2016   IR US GUIDE VASC ACCESS RIGHT 04/10/2016 Corrie Mckusick, DO [facility]   IR GENERIC HISTORICAL  04/10/2016   IR ANGIOGRAM SELECTIVE EACH ADDITIONAL VESSEL 04/10/2016 Corrie Mckusick, DO [facility]   IR GENERIC HISTORICAL  04/10/2016   IR ANGIOGRAM SELECTIVE EACH ADDITIONAL VESSEL 04/10/2016 Corrie Mckusick, DO [facility]   IR GENERIC HISTORICAL  04/10/2016   IR EMBO TUMOR ORGAN ISCHEMIA INFARCT INC GUIDE ROADMAPPING 04/10/2016 Corrie Mckusick, DO [facility]   IR GENERIC HISTORICAL  04/10/2016   IR ANGIOGRAM SELECTIVE EACH ADDITIONAL VESSEL 04/10/2016 Corrie Mckusick, DO [facility]   IR GENERIC HISTORICAL  04/10/2016   IR ANGIOGRAM SELECTIVE EACH ADDITIONAL VESSEL 04/10/2016 Corrie Mckusick, DO [facility]   IR GENERIC HISTORICAL  04/10/2016   IR RENAL SELECTIVE  UNI INC S&I MOD SED 04/10/2016 Corrie Mckusick, DO [facility]   IR GENERIC HISTORICAL  03/06/2016   IR RADIOLOGIST EVAL & MGMT 03/06/2016 Corrie Mckusick, DO [facility]   IR GENERIC HISTORICAL  03/26/2016   IR RADIOLOGIST EVAL & MGMT 03/26/2016 Corrie Mckusick, DO [facility]   IR GENERIC HISTORICAL  04/29/2016   IR RADIOLOGIST EVAL & MGMT 04/29/2016 [facility]   IR RADIOLOGIST EVAL & MGMT  11/12/2016   IR RADIOLOGIST EVAL & MGMT  12/16/2017   KNEE SURGERY     TONSILLECTOMY        Current Facility-Administered Medications:    acetaminophen (TYLENOL) tablet 650 mg, 650 mg, Oral, Q6H PRN, 650 mg at 05/23/21 0954 **OR** acetaminophen (TYLENOL) suppository 650 mg, 650 mg, Rectal, Q6H PRN, Marcelyn Bruins, MD   ceFEPIme (MAXIPIME) 2 g in sodium chloride 0.9 % 100 mL IVPB, 2 g, Intravenous, Q24H, Angela Adam, RPH, Stopped at 05/23/21 2300   diclofenac Sodium (VOLTAREN) 1 % topical gel 2  g, 2 g, Topical, QID, Pokhrel, Laxman, MD, 2 g at 05/24/21 0940   enoxaparin (LOVENOX) injection 30 mg, 30 mg, Subcutaneous, Q24H, Marcelyn Bruins, MD, 30 mg at 05/24/21 0500   feeding supplement (ENSURE ENLIVE / ENSURE PLUS) liquid 237 mL, 237 mL, Oral, BID BM, Marcelyn Bruins, MD, 237 mL at 05/23/21 9563   hydrALAZINE (APRESOLINE) injection 5 mg, 5 mg, Intravenous, Q6H PRN, Pokhrel, Laxman, MD   hydrALAZINE (APRESOLINE) tablet 25 mg, 25 mg, Oral, Q8H, Pokhrel, Laxman, MD   insulin aspart (novoLOG) injection 0-9 Units, 0-9 Units, Subcutaneous, TID WC, Marcelyn Bruins, MD, 1 Units at 05/23/21 1242   metroNIDAZOLE (FLAGYL) IVPB 500 mg, 500 mg, Intravenous, Q12H, Marcelyn Bruins, MD, Last Rate: 100 mL/hr at 05/24/21 0954, 500 mg at 05/24/21 [phone]  mupirocin ointment (BACTROBAN) 2 % 1 application, 1 application, Nasal, BID, Pokhrel, Laxman, MD, 1 application at 31/49/70 0937   Muscle Rub CREA 1 application, 1 application, Topical, Daily PRN, Marcelyn Bruins, MD   pantoprazole (PROTONIX) EC tablet 40 mg, 40 mg, Oral, BID, Marcelyn Bruins, MD, 40 mg at 05/24/21 0932   polyethylene glycol (MIRALAX / GLYCOLAX) packet 17 g, 17 g, Oral, Daily PRN, Marcelyn Bruins, MD   polyvinyl alcohol (LIQUIFILM TEARS) 1.4 % ophthalmic solution 1 drop, 1 drop, Both Eyes, PRN, Marcelyn Bruins, MD   rosuvastatin (CRESTOR) tablet 20 mg, 20 mg, Oral, Daily, Marcelyn Bruins, MD, 20 mg at 05/24/21 0934   sodium chloride flush (NS) 0.9 % injection 3 mL, 3 mL, Intravenous, Q12H, Marcelyn Bruins, MD, 3 mL at 05/24/21 2637  diclofenac Sodium  2 g Topical QID   enoxaparin (LOVENOX) injection  30 mg Subcutaneous Q24H   feeding supplement  237 mL Oral BID BM   hydrALAZINE  25 mg Oral Q8H   insulin aspart  0-9 Units Subcutaneous TID WC   mupirocin ointment  1 application Nasal BID   pantoprazole  40 mg Oral BID   rosuvastatin  20 mg Oral Daily   sodium chloride flush  3 mL Intravenous Q12H     ceFEPime (MAXIPIME) IV Stopped (05/23/21 2300)   metronidazole 500 mg (05/24/21 0954)    Physical Exam: Blood pressure (!) 185/99, pulse 92, temperature 98.5 F (36.9 C), temperature source Oral, resp. rate 20, height 5\' 5"  (1.651 m), weight 61.4 kg, SpO2 99 %.    Frail elderly female Lungs clear poor inspiratory effort No murmur  Abdomen benign  No edema Palpable pedal pulses   Labs:   Lab Results  Component Value Date   WBC 10.6 (H) 05/24/2021   HGB 12.5 05/24/2021   HCT 37.4 05/24/2021   MCV 87.8 05/24/2021   PLT 345 05/24/2021    Recent Labs  Lab 05/23/21 0448 05/24/21 0426  NA 132* 132*  K 3.9 3.2*  CL 108 106  CO2 13* 15*  BUN 33* 29*  CREATININE 1.70* 1.57*  CALCIUM 9.1 9.1  PROT 6.9  --   BILITOT 0.8  --   ALKPHOS 54  --   ALT 13  --   AST 17  --   GLUCOSE 121* 98   No results found for: CKTOTAL, CKMB, CKMBINDEX, TROPONINI  Lab Results  Component Value Date   CHOL 150 10/18/2020   CHOL 166 06/12/2020   CHOL 224 (H) 06/29/2016   Lab Results  Component Value Date   HDL 74 10/18/2020   HDL 96 06/12/2020   HDL 64 06/29/2016   Lab Results  Component Value Date   LDLCALC 51 10/18/2020   LDLCALC 35 06/12/2020   LDLCALC 124 (H) 06/29/2016   Lab Results  Component Value Date   TRIG 153 (H) 10/18/2020   TRIG 238 (H) 06/12/2020   TRIG 180 (H) 06/29/2016   Lab Results  Component Value Date   CHOLHDL 2.0 10/18/2020   CHOLHDL 1.7 06/12/2020   CHOLHDL 3.5 06/29/2016   No results found for: LDLDIRECT    Radiology: CT ABDOMEN PELVIS WO CONTRAST  Result Date: 05/22/2021 CLINICAL DATA:  Nausea/vomiting, altered mental status EXAM: CT ABDOMEN AND PELVIS WITHOUT CONTRAST TECHNIQUE: Multidetector CT imaging of the abdomen and pelvis was performed following the standard protocol without IV contrast. COMPARISON:  10/09/2020 FINDINGS: Lower chest: Mild subpleural scarring in the medial right lower lobe. Hepatobiliary:  Unenhanced liver is  unremarkable. Gallbladder is unremarkable. No intrahepatic or extrahepatic duct dilatation. Pancreas: Within normal limits. Spleen: Within normal limits. Adrenals/Urinary Tract: Adrenal glands are within normal limits. 2.7 cm medial left upper pole renal lesion with macroscopic fat, unchanged, compatible with a benign renal angiomyolipoma. Additional 3.1 cm left lower pole renal cyst. Right kidney is within normal limits. No hydronephrosis. Bladder is within normal limits. Stomach/Bowel: Stomach is notable for a moderate hiatal hernia. No evidence of bowel obstruction. Appendix is not discretely visualized. Sigmoid diverticulosis, without evidence of diverticulitis. Vascular/Lymphatic: No evidence of abdominal aortic aneurysm. Atherosclerotic calcifications of the abdominal aorta and branch vessels. No suspicious abdominopelvic lymphadenopathy. Reproductive: Status post hysterectomy. No adnexal masses. Other: No abdominopelvic ascites. Musculoskeletal: Grade 2 anterolisthesis of L5 on S1. Mild degenerative changes of the lower lumbar spine. IMPRESSION: Sigmoid diverticulosis, without evidence of diverticulitis. Moderate hiatal hernia. Stable left renal angiomyolipoma. Electronically Signed   By: Julian Hy M.D.   On: 05/22/2021 22:50   DG Chest 2 View  Result Date: 05/09/2021 CLINICAL DATA:  Fall. EXAM: CHEST - 2 VIEW COMPARISON:  04/05/2021 FINDINGS: The cardiomediastinal silhouette is within normal limits. Aortic atherosclerosis is noted. Small nodular densities in the right mid upper lung are chronic. No acute airspace consolidation, edema, pleural effusion, pneumothorax is identified. No acute osseous abnormality is seen. IMPRESSION: No active cardiopulmonary disease. Electronically Signed   By: Logan Bores M.D.   On: 05/09/2021 16:18   DG Shoulder Right  Result Date: 05/09/2021 CLINICAL DATA:  Fall.  Right shoulder pain. EXAM: RIGHT SHOULDER - 2+ VIEW COMPARISON:  Chest radiograph 03/31/2021  FINDINGS: No acute fracture or dislocation is identified. There is a chronic deformity of the lateral right clavicle with prominent acromioclavicular spurring. The chest is reported separately. IMPRESSION: No acute osseous abnormality identified. Electronically Signed   By: Logan Bores M.D.   On: 05/09/2021 16:15   CT Head Wo Contrast  Result Date: 05/22/2021 CLINICAL DATA:  Head trauma EXAM: CT HEAD WITHOUT CONTRAST TECHNIQUE: Contiguous axial images were obtained from the base of the skull through the vertex without intravenous contrast. COMPARISON:  CT brain 05/09/2021, 03/31/2021 FINDINGS: Brain: No acute territorial infarction, hemorrhage or intracranial mass. Advanced atrophy. Advanced chronic small vessel ischemic changes of the white matter. Stable ventricle size. Vascular: No hyperdense vessels.  Carotid vascular calcification Skull: Normal. Negative for fracture or focal lesion. Sinuses/Orbits: No acute finding. Other: None IMPRESSION: 1. No CT evidence for acute intracranial abnormality. 2. Atrophy and chronic small vessel ischemic changes of the white matter Electronically Signed   By: Donavan Foil M.D.   On: 05/22/2021 19:56   CT Head Wo Contrast  Result Date: 05/09/2021 CLINICAL DATA:  Head trauma, mod-severe.  Fall. EXAM: CT HEAD WITHOUT CONTRAST TECHNIQUE: Contiguous axial images were obtained from the base of the skull through the vertex without intravenous contrast. COMPARISON:  03/31/2021 FINDINGS: Brain: There is no evidence of an acute infarct, intracranial hemorrhage, mass, midline shift, or extra-axial fluid collection. Confluent hypodensities in the cerebral white matter bilaterally are unchanged and nonspecific but compatible with severe chronic small vessel ischemic disease. Small chronic infarcts in the right thalamus and left cerebellar hemisphere are unchanged. There is mild-to-moderate cerebral atrophy. Vascular: Calcified atherosclerosis at the skull base. No hyperdense  vessel. Skull: No fracture or suspicious osseous lesion. Sinuses/Orbits: Chronic left sphenoid sinusitis. Clear mastoid air cells. Bilateral cataract extraction. Other: None. IMPRESSION: 1. No evidence of acute intracranial abnormality. 2. Severe chronic small vessel ischemic disease.  Electronically Signed   By: Logan Bores M.D.   On: 05/09/2021 16:26   DG Chest Port 1 View  Result Date: 05/22/2021 CLINICAL DATA:  Nausea vomiting EXAM: PORTABLE CHEST 1 VIEW COMPARISON:  05/09/2021 FINDINGS: The heart size and mediastinal contours are within normal limits. Aortic atherosclerosis. Both lungs are clear. The visualized skeletal structures are unremarkable. IMPRESSION: No active disease. Electronically Signed   By: Donavan Foil M.D.   On: 05/22/2021 19:57   US THYROID  Result Date: 05/07/2021 CLINICAL DATA:  Thyroid nodule seen on recent CT EXAM: THYROID ULTRASOUND TECHNIQUE: Ultrasound examination of the thyroid gland and adjacent soft tissues was performed. COMPARISON:  None. FINDINGS: Parenchymal Echotexture: Mild heterogeneous Isthmus: 0.5 cm Right lobe: 4.0 x 1.8 x 2.1 cm Left lobe: 3.5 x 1.7 x 1.3 cm _________________________________________________________ Estimated total number of nodules >/= 1 cm: 1 Number of spongiform nodules >/=  2 cm not described below (TR1): 0 Number of mixed cystic and solid nodules >/= 1.5 cm not described below (TR2): 0 _________________________________________________________ Nodule # 1: Location: Right; mid Maximum size: 1.8 cm; Other 2 dimensions: 1.8 x 1.6 cm Composition: mixed cystic and solid (1) Echogenicity: isoechoic (1) Shape: not taller-than-wide (0) Margins: smooth (0) Echogenic foci: none (0) ACR TI-RADS total points: 2. ACR TI-RADS risk category: TR2 (2 points). ACR TI-RADS recommendations: This nodule does NOT meet TI-RADS criteria for biopsy or dedicated follow-up. _________________________________________________________ IMPRESSION: Solitary mixed solid  cystic right thyroid nodule does not meet criteria for FNA or imaging surveillance. The above is in keeping with the ACR TI-RADS recommendations - J Am Coll Radiol 2017;14:587-595. Electronically Signed   By: Miachel Roux M.D.   On: 05/07/2021 08:14   LONG TERM MONITOR-LIVE TELEMETRY (3-14 DAYS)  Result Date: 05/23/2021  1 episode of NSVT lasting 4 beats  Second-degree AV block Mobitz 1 was present  Patch Wear Time:  13 days and 23 hours (2022-09-20T16:32:05-399 to 2022-10-04T15:56:19-0400) Patient had a min HR of 39 bpm, max HR of 154 bpm, and avg HR of 79 bpm. Predominant underlying rhythm was Sinus Rhythm. First Degree AV Block was present. 1 run of Ventricular Tachycardia occurred lasting 4 beats with a max rate of 154 bpm (avg 113 bpm). Second Degree AV Block-Mobitz I (Wenckebach) was present. Isolated SVEs were rare (<1.0%), SVE Couplets were rare (<1.0%), and no SVE Triplets were present. Isolated VEs were rare (<1.0%), VE Couplets were rare (<1.0%), and no VE Triplets were present. Ventricular Bigeminy was present.  1 patient triggered event, corresponding to sinus rhythm with PACs    EKG: See HPI   ASSESSMENT AND PLAN:   Tachybrady:  Needs EP f/u She has had confusion extremely poor functional status and is still on iv Maxipime and Flagyl Clearly is not a candidate for PPM this admission Would add back low dose beta blocker for HTN/and predominant tachycardia now. Increase hydralazine Continue to w/u source of likely infection and confusion PT/OT for functional capacity.  Again not clear to me that this is HOCM see above   Signed: Jenkins Rouge 05/24/2021, 11:12 AM

## 2021-05-25 DIAGNOSIS — I495 Sick sinus syndrome: Secondary | ICD-10-CM | POA: Diagnosis not present

## 2021-05-25 DIAGNOSIS — R6510 Systemic inflammatory response syndrome (SIRS) of non-infectious origin without acute organ dysfunction: Secondary | ICD-10-CM | POA: Diagnosis not present

## 2021-05-25 DIAGNOSIS — I10 Essential (primary) hypertension: Secondary | ICD-10-CM | POA: Diagnosis not present

## 2021-05-25 DIAGNOSIS — N179 Acute kidney failure, unspecified: Secondary | ICD-10-CM | POA: Diagnosis not present

## 2021-05-25 DIAGNOSIS — E860 Dehydration: Secondary | ICD-10-CM | POA: Diagnosis not present

## 2021-05-25 LAB — CBC
HCT: 41.4 % (ref 36.0–46.0)
Hemoglobin: 13.7 g/dL (ref 12.0–15.0)
MCH: 29.4 pg (ref 26.0–34.0)
MCHC: 33.1 g/dL (ref 30.0–36.0)
MCV: 88.8 fL (ref 80.0–100.0)
Platelets: 397 10*3/uL (ref 150–400)
RBC: 4.66 MIL/uL (ref 3.87–5.11)
RDW: 15.6 % — ABNORMAL HIGH (ref 11.5–15.5)
WBC: 10.1 10*3/uL (ref 4.0–10.5)
nRBC: 0 % (ref 0.0–0.2)

## 2021-05-25 LAB — BASIC METABOLIC PANEL
Anion gap: 10 (ref 5–15)
BUN: 25 mg/dL — ABNORMAL HIGH (ref 8–23)
CO2: 18 mmol/L — ABNORMAL LOW (ref 22–32)
Calcium: 9.5 mg/dL (ref 8.9–10.3)
Chloride: 105 mmol/L (ref 98–111)
Creatinine, Ser: 1.52 mg/dL — ABNORMAL HIGH (ref 0.44–1.00)
GFR, Estimated: 34 mL/min — ABNORMAL LOW (ref >60)
Glucose, Bld: 104 mg/dL — ABNORMAL HIGH (ref 70–99)
Potassium: 4 mmol/L (ref 3.5–5.1)
Sodium: 133 mmol/L — ABNORMAL LOW (ref 135–145)

## 2021-05-25 LAB — GLUCOSE, CAPILLARY
Glucose-Capillary: 89 mg/dL (ref 70–99)
Glucose-Capillary: 125 mg/dL — ABNORMAL HIGH (ref 70–99)
Glucose-Capillary: 89 mg/dL (ref 70–99)
Glucose-Capillary: 103 mg/dL — ABNORMAL HIGH (ref 70–99)

## 2021-05-25 LAB — MAGNESIUM: Magnesium: 2.1 mg/dL (ref 1.7–2.4)

## 2021-05-25 MED ORDER — HYDRALAZINE HCL 50 MG PO TABS
100.0000 mg | ORAL_TABLET | Freq: Three times a day (TID) | ORAL | Status: DC
  Administered 2021-05-25 – 2021-06-06 (×29): 100 mg via ORAL
  Filled 2021-05-25 (×29): qty 2

## 2021-05-25 NOTE — Progress Notes (Signed)
Progress Note  Patient Name: Anna Hoffman Date of Encounter: 05/25/2021  [facility] Cardiologist: Gilman Schmidt  Subjective   No complaints  Inpatient Medications    Scheduled Meds:  diclofenac Sodium  2 g Topical QID   enoxaparin (LOVENOX) injection  30 mg Subcutaneous Q24H   feeding supplement (K[employer]MS STANDARD 1.4)  325 mL Oral BID BM   hydrALAZINE  50 mg Oral Q8H   insulin aspart  0-9 Units Subcutaneous TID WC   multivitamin with minerals  1 tablet Oral Daily   mupirocin ointment  1 application Nasal BID   pantoprazole  40 mg Oral BID   propranolol  10 mg Oral BID   rosuvastatin  20 mg Oral Daily   sodium chloride flush  3 mL Intravenous Q12H   Continuous Infusions:  ceFEPime (MAXIPIME) IV Stopped (05/24/21 2153)   PRN Meds: acetaminophen **OR** acetaminophen, hydrALAZINE, Muscle Rub, polyethylene glycol, polyvinyl alcohol   Vital Signs    Vitals:   05/24/21 1530 05/24/21 1603 05/24/21 2122 05/25/21 0502  BP: (!) 174/101 (!) 192/108 (!) 188/107 (!) 192/102  Pulse:  97 91 76  Resp:  20 18 18   Temp:  97.7 F (36.5 C) 98.7 F (37.1 C) 98 F (36.7 C)  TempSrc:  Oral Oral Oral  SpO2:  100% 97% 90%  Weight:      Height:        Intake/Output Summary (Last 24 hours) at 05/25/2021 0828 Last data filed at 05/25/2021 0340 Gross per 24 hour  Intake 500 ml  Output 1000 ml  Net -500 ml   Last 3 Weights 05/23/2021 05/16/2021 05/10/2021  Weight (lbs) 135 lb 5.8 oz 138 lb 138 lb  Weight (kg) 61.4 kg 62.596 kg 62.596 kg      Telemetry    SR - Personally Reviewed  ECG    N/a - Personally Reviewed  Physical Exam   GEN: No acute distress.   Neck: No JVD Cardiac: RRR, no murmurs, rubs, or gallops.  Respiratory: Clear to auscultation bilaterally. GI: Soft, nontender, non-distended  MS: No edema; No deformity. Neuro:  Nonfocal  Psych: Normal affect   Labs    High Sensitivity Troponin:   Recent Labs  Lab 05/09/21 1825  TROPONINIHS 4      Chemistry Recent Labs  Lab 05/22/21 2030 05/22/21 2042 05/23/21 0448 05/24/21 0426  NA 137  --  132* 132*  K 4.4  --  3.9 3.2*  CL 109  --  108 106  CO2 13*  --  13* 15*  GLUCOSE 150*  --  121* 98  BUN 38*  --  33* 29*  CREATININE 1.86*  --  1.70* 1.57*  CALCIUM 10.5*  --  9.1 9.1  MG  --  2.5*  --  2.0  PROT 8.8*  --  6.9  --   ALBUMIN 5.0  --  3.8  --   AST 19  --  17  --   ALT 17  --  13  --   ALKPHOS 72  --  54  --   BILITOT 0.8  --  0.8  --   GFRNONAA 26*  --  29* 32*  ANIONGAP 15  --  11 11    Lipids No results for input(s): CHOL, TRIG, HDL, LABVLDL, LDLCALC, CHOLHDL in the last 168 hours.  Hematology Recent Labs  Lab 05/23/21 0448 05/24/21 0426 05/25/21 0704  WBC 14.6* 10.6* 10.1  RBC 4.36 4.26 4.66  HGB 12.7 12.5 13.7  HCT 39.5 37.4 41.4  MCV 90.6 87.8 88.8  MCH 29.1 29.3 29.4  MCHC 32.2 33.4 33.1  RDW 15.7* 15.8* 15.6*  PLT 316 345 397   Thyroid No results for input(s): TSH, FREET4 in the last 168 hours.  BNPNo results for input(s): BNP, PROBNP in the last 168 hours.  DDimer No results for input(s): DDIMER in the last 168 hours.   Radiology    No results found.  Cardiac Studies     Patient Profile     86 y.o. female  CKD 3A, hypertension, ascending aortic dilation, colitis, hypertrophic cardiomyopathy, hypertension, intracerebral hemorrhage/CVA, tachy brady syndrome admitted with nausea, vomiting, fatigue and SIRS  Assessment & Plan    Tachy-brady syndrome - followed by EP as outpatient. Has had documented Mobitz 2 block. Issues with atach yestserday - she has had syncope as outpatient unclear etiology, outpatient plans outpatient pacemaker - not ideal candiate for inpatient given presentation with SIRs, AMS - primary issue with tachycardia here, she was started on propranolol 10mg  bid by Dr Johnsie Cancel - rates today look good, no significant tachy or brady episodes. Continue low dose propanolol, outpatient pacemaker once AMS and SIRs have  resolved.    2. Hypertrophic nonobstructive CM - beta blocker previously stopped due to bradycardia, second degree mobitz II block  3. SIRs - SIRS criteria on presentation with tachycardia, elevated lactate leukocytosis - no clear infectious source - management per primary team.   4. AKI on CKD - has improved with IVFs, likely prerenal from N/V  5. HTN - she is on hydralazine 20m q8, increase to 100mg  tid.   For questions or updates, please contact [place] Please consult [email or web address] for contact info under        Signed, Carlyle Dolly, MD  05/25/2021, 8:28 AM

## 2021-05-25 NOTE — Progress Notes (Addendum)
PROGRESS NOTE  Jazmine Heckman Gadea QZR:[id number] DOB: [birth year] DOA: 05/22/2021 PCP: Glendale Chard, MD   LOS: 2 days   Brief narrative: Anna Hoffman is a 86 y.o. female with medical history significant of esophagitis, CKD stage IIIa, hypertension, colitis, hypertrophic cardiomyopathy and intracerebral hemorrhage, diabetes presented to hospital with nausea vomiting and weakness for 2 to 3 days with difficulty ambulating and confusion for few weeks.  She also had a fall with some trauma to the right shoulder.  In the ED, patient was mildly tachycardic and hypertensive.  Creatinine was elevated at 1.82 from baseline 1.2.  There was mild leukocytosis but hemoglobin was 16.1.  X-ray of chest, CT head scan and pelvic scan were negative for acute findings except for left renal angiomyolipoma.  Patient received vancomycin cefepime and Flagyl in the ED and was admitted to hospital after IV fluid bolus.    At this time, patient does not have any positive cultures.  Antibiotics have been de-escalated.  Patient continues to be weak and debilitated and PT has recommended skilled nursing facility placement.  Cardiology has seen the patient as well for her history of intermittent heart block and question hypertrophic cardiomyopathy.  Assessment/Plan:  Principal Problem:   SIRS (systemic inflammatory response syndrome) ([name]) Active Problems:   Benign essential HTN   Type 2 diabetes mellitus with stage 3 chronic kidney disease, without long-term current use of insulin ([name])   Mixed hyperlipidemia   Acute renal failure superimposed on stage 3a chronic kidney disease ([name])   Prolonged QT interval   Dehydration   HOCM (hypertrophic obstructive cardiomyopathy) ([place])   History of CVA (cerebrovascular accident)   Sepsis ([place])   Malnutrition of moderate degree   SIRS. SIRS criteria on presentation with tachycardia, elevated lactate leukocytosis.  Could be from ongoing nausea and vomiting.  CT scan of the  abdomen pelvis without any acute findings.   MRSA PCR negative.  COVID influenza negative.    Blood cultures and urine cultures negative so far.  Patient's son states that in the past she had UTI and was very confused.  Temperature max of 98.7 F.  Leukocytosis mild.  We will discontinue cefepime after today's dose  AKI on CKD 3 Baseline creatinine of around 1.2.  Creatinine was 1.6 on presentation.  Creatinine of 1.5 today.  Received IV fluids.  Will discontinue for now.  Labs pending from today.  Hypertrophic cardiomyopathy with history of Intermittent second degree heart block Last known 2D echocardiogram with EF 35-45% with G1 diastolic dysfunction and focal findings.  Patient does have history of syncope secondary to intermittent secondary heart block and has plans for pacemaker placement in the future. .  He has been consulted.  On hydralazine and propanolol.  Essential hypertension  On Hydralazine and propranolol.  We will continue to monitor.  Diabetes mellitus type II Continue sliding scale insulin Accu-Cheks, diabetic diet.  Latest POC glucose of 89  Hypokalemia.  Replenished yesterday.  BMP pending for today.  Latest magnesium of two-point   Hyperlipidemia Continue Crestor  Recent fall, debility weakness.  PT and OT has seen the patient and recommended skilled nursing facility placement.  Right shoulder pain from recent fall and history of rotator cuff tear.  Continue K pad local analgesic cream Tylenol.  X-ray of the right shoulder without any fracture.  Continue physical therapy.  Moderate protein calorie malnutrition.  Present on admission.  Dietary on board.  Continue nutritional supplements.  Change diet to cardiac.  Disposition.  At this  time patient has been seen by physical therapy Occupational Therapy who recommended skilled nursing facility placement on discharge.  DVT prophylaxis: enoxaparin (LOVENOX) injection 30 mg Start: 05/23/21 0600  Code Status: Full  code  Family Communication:  Spoke with the patient's son at bedside on 05/24/2021  Status is: Inpatient  The patient is inpatient because: Need for IV antibiotics, physical therapy evaluation, ongoing shoulder pain, need for skilled nursing facility placement.   Consultants: Cardiology  Procedures: None  Anti-infectives:  Cefepime   Subjective:  Today, patient was seen and examined at bedside.  Patient still complains of mild shoulder pain.  Poor historian has underlying dementia.  No nausea vomiting reported.  Objective: Vitals:   05/24/21 2122 05/25/21 0502  BP: (!) 188/107 (!) 192/102  Pulse: 91 76  Resp: 18 18  Temp: 98.7 F (37.1 C) 98 F (36.7 C)  SpO2: 97% 90%    Intake/Output Summary (Last 24 hours) at 05/25/2021 0754 Last data filed at 05/25/2021 0340 Gross per 24 hour  Intake 500 ml  Output 1000 ml  Net -500 ml    Filed Weights   05/23/21 0237  Weight: 61.4 kg   Body mass index is 22.53 kg/m.   Physical Exam: General:  Average built, not in obvious distress HENT:   No scleral pallor or icterus noted. Oral mucosa is moist.  Chest:   Diminished breath sounds bilaterally. No crackles or wheezes.  CVS: S1 &S2 heard. No murmur.  Regular rate and rhythm. Abdomen: Soft, nontender, nondistended.  Bowel sounds are heard.   Extremities: No cyanosis, clubbing or edema.  Peripheral pulses are palpable.  Mild tenderness of the right shoulder with restricted movement Psych: Alert, awake and oriented, normal mood CNS:  No cranial nerve deficits.  Power equal in all extremities.   Skin: Warm and dry.  No rashes noted.  Data Review: I have personally reviewed the following laboratory data and studies,  CBC: Recent Labs  Lab 05/22/21 2030 05/23/21 0448 05/24/21 0426  WBC 17.4* 14.6* 10.6*  NEUTROABS 15.1*  --   --   HGB 16.1* 12.7 12.5  HCT 48.6* 39.5 37.4  MCV 88.2 90.6 87.8  PLT 391 316 818    Basic Metabolic Panel: Recent Labs  Lab  05/22/21 2030 05/22/21 2042 05/23/21 0448 05/24/21 0426  NA 137  --  132* 132*  K 4.4  --  3.9 3.2*  CL 109  --  108 106  CO2 13*  --  13* 15*  GLUCOSE 150*  --  121* 98  BUN 38*  --  33* 29*  CREATININE 1.86*  --  1.70* 1.57*  CALCIUM 10.5*  --  9.1 9.1  MG  --  2.5*  --  2.0    Liver Function Tests: Recent Labs  Lab 05/22/21 2030 05/23/21 0448  AST 19 17  ALT 17 13  ALKPHOS 72 54  BILITOT 0.8 0.8  PROT 8.8* 6.9  ALBUMIN 5.0 3.8    Recent Labs  Lab 05/22/21 2030  LIPASE 26    No results for input(s): AMMONIA in the last 168 hours. Cardiac Enzymes: No results for input(s): CKTOTAL, CKMB, CKMBINDEX, TROPONINI in the last 168 hours. BNP (last 3 results) No results for input(s): BNP in the last 8760 hours.  ProBNP (last 3 results) No results for input(s): PROBNP in the last 8760 hours.  CBG: Recent Labs  Lab 05/24/21 0722 05/24/21 1117 05/24/21 1653 05/24/21 2118 05/25/21 0725  GLUCAP 98 114* 101* 116* 89  Recent Results (from the past 240 hour(s))  Blood Culture (routine x 2)     Status: None (Preliminary result)   Collection Time: 05/22/21  9:58 PM   Specimen: Right Antecubital; Blood  Result Value Ref Range Status   Specimen Description   Final    RIGHT ANTECUBITAL Performed at [facility] [address], [place], [place] [address]    Special Requests   Final    BOTTLES DRAWN AEROBIC AND ANAEROBIC Blood Culture results may not be optimal due to an inadequate volume of blood received in culture bottles Performed at [facility] [address] [place], [place] [address]    Culture   Final    NO GROWTH 1 DAY Performed at [facility], [place] [address], [place], [place] [address]    Report Status PENDING  Incomplete  Blood Culture (routine x 2)     Status: None (Preliminary result)   Collection Time: 05/22/21  9:59 PM   Specimen: BLOOD LEFT HAND  Result Value Ref Range Status   Specimen  Description   Final    BLOOD LEFT HAND Performed at [facility] [address], [place], [place] [address]    Special Requests   Final    BOTTLES DRAWN AEROBIC ONLY Blood Culture results may not be optimal due to an inadequate volume of blood received in culture bottles Performed at [facility] [address], [place], [place] [address]    Culture   Final    NO GROWTH 1 DAY Performed at [facility], [place] [address], [place], [place] [address]    Report Status PENDING  Incomplete  Urine Culture     Status: None   Collection Time: 05/23/21 12:30 AM   Specimen: In/Out Cath Urine  Result Value Ref Range Status   Specimen Description   Final    IN/OUT CATH URINE Performed at [facility] [address], [place], [place] [address]    Special Requests   Final    NONE Performed at [facility], [place] [address] [place], [place] [address]    Culture   Final    NO GROWTH Performed at [facility], [place] [address] [place], [place] [address]    Report Status 05/24/2021 FINAL  Final  Surgical pcr screen     Status: Abnormal   Collection Time: 05/23/21 12:49 AM   Specimen: Nasal Mucosa; Nasal Swab  Result Value Ref Range Status   MRSA, PCR NEGATIVE NEGATIVE Final   Staphylococcus aureus POSITIVE (A) NEGATIVE Final    Comment: (NOTE) The Xpert SA Assay (FDA approved for NASAL specimens in patients 26 years of age and older), is one component of a comprehensive surveillance program. It is not intended to diagnose infection nor to guide or monitor treatment. Performed at [facility], [place] [address], [place], [place] [address]   Resp Panel by RT-PCR (Flu A&B, Covid) Nasal Mucosa     Status: None   Collection Time: 05/23/21 12:49 AM   Specimen: Nasal Mucosa; Nasopharyngeal(NP) swabs in vial transport medium  Result Value Ref Range Status   SARS Coronavirus 2 by RT PCR NEGATIVE  NEGATIVE Final    Comment: (NOTE) SARS-CoV-2 target nucleic acids are NOT DETECTED.  The SARS-CoV-2 RNA is generally detectable in upper respiratory specimens during the acute phase of infection. The lowest concentration of SARS-CoV-2 viral copies this assay can detect is 138 copies/mL.  A negative result does not preclude SARS-Cov-2 infection and should not be used as the sole basis for treatment or other patient management decisions. A negative result may occur with  improper specimen collection/handling, submission of specimen other than nasopharyngeal swab, presence of viral mutation(s) within the areas targeted by this assay, and inadequate number of viral copies(<138 copies/mL). A negative result must be combined with clinical observations, patient history, and epidemiological information. The expected result is Negative.  Fact Sheet for Patients:  EntrepreneurPulse.com.au  Fact Sheet for Healthcare Providers:  IncredibleEmployment.be  This test is no t yet approved or cleared by the [place] FDA and  has been authorized for detection and/or diagnosis of SARS-CoV-2 by FDA under an Emergency Use Authorization (EUA). This EUA will remain  in effect (meaning this test can be used) for the duration of the COVID-19 declaration under Section 564(b)(1) of the Act, 21 U.S.C.section 360bbb-3(b)(1), unless the authorization is terminated  or revoked sooner.       Influenza A by PCR NEGATIVE NEGATIVE Final   Influenza B by PCR NEGATIVE NEGATIVE Final    Comment: (NOTE) The Xpert Xpress SARS-CoV-2/FLU/RSV plus assay is intended as an aid in the diagnosis of influenza from Nasopharyngeal swab specimens and should not be used as a sole basis for treatment. Nasal washings and aspirates are unacceptable for Xpert Xpress SARS-CoV-2/FLU/RSV testing.  Fact Sheet for Patients: EntrepreneurPulse.com.au  Fact Sheet for Healthcare  Providers: IncredibleEmployment.be  This test is not yet approved or cleared by the [place] FDA and has been authorized for detection and/or diagnosis of SARS-CoV-2 by FDA under an Emergency Use Authorization (EUA). This EUA will remain in effect (meaning this test can be used) for the duration of the COVID-19 declaration under Section 564(b)(1) of the Act, 21 U.S.C. section 360bbb-3(b)(1), unless the authorization is terminated or revoked.  Performed at [facility], [place] [address], [place], [place] [address]       Studies: No results found.   Flora Lipps, MD  [facility] 05/25/2021  If 7PM-7AM, please contact night-coverage

## 2021-05-26 ENCOUNTER — Inpatient Hospital Stay (HOSPITAL_COMMUNITY): Payer: Medicare Other

## 2021-05-26 DIAGNOSIS — N179 Acute kidney failure, unspecified: Secondary | ICD-10-CM | POA: Diagnosis not present

## 2021-05-26 DIAGNOSIS — E860 Dehydration: Secondary | ICD-10-CM | POA: Diagnosis not present

## 2021-05-26 DIAGNOSIS — R6510 Systemic inflammatory response syndrome (SIRS) of non-infectious origin without acute organ dysfunction: Secondary | ICD-10-CM | POA: Diagnosis not present

## 2021-05-26 DIAGNOSIS — I10 Essential (primary) hypertension: Secondary | ICD-10-CM | POA: Diagnosis not present

## 2021-05-26 LAB — BASIC METABOLIC PANEL
Anion gap: 11 (ref 5–15)
BUN: 26 mg/dL — ABNORMAL HIGH (ref 8–23)
CO2: 16 mmol/L — ABNORMAL LOW (ref 22–32)
Calcium: 9.8 mg/dL (ref 8.9–10.3)
Chloride: 103 mmol/L (ref 98–111)
Creatinine, Ser: 1.55 mg/dL — ABNORMAL HIGH (ref 0.44–1.00)
GFR, Estimated: 33 mL/min — ABNORMAL LOW (ref >60)
Glucose, Bld: 120 mg/dL — ABNORMAL HIGH (ref 70–99)
Potassium: 3.8 mmol/L (ref 3.5–5.1)
Sodium: 130 mmol/L — ABNORMAL LOW (ref 135–145)

## 2021-05-26 LAB — BLOOD GAS, ARTERIAL
Acid-base deficit: 9.9 mmol/L — ABNORMAL HIGH (ref 0.0–2.0)
Bicarbonate: 13.1 mmol/L — ABNORMAL LOW (ref 20.0–28.0)
Drawn by: 22052
O2 Saturation: 96.1 %
Patient temperature: 98.6
pCO2 arterial: 22.9 mmHg — ABNORMAL LOW (ref 32.0–48.0)
pH, Arterial: 7.376 (ref 7.350–7.450)
pO2, Arterial: 95.5 mmHg (ref 83.0–108.0)

## 2021-05-26 LAB — CBC
HCT: 43.8 % (ref 36.0–46.0)
Hemoglobin: 14.6 g/dL (ref 12.0–15.0)
MCH: 29 pg (ref 26.0–34.0)
MCHC: 33.3 g/dL (ref 30.0–36.0)
MCV: 87.1 fL (ref 80.0–100.0)
Platelets: 435 10*3/uL — ABNORMAL HIGH (ref 150–400)
RBC: 5.03 MIL/uL (ref 3.87–5.11)
RDW: 15.4 % (ref 11.5–15.5)
WBC: 11.8 10*3/uL — ABNORMAL HIGH (ref 4.0–10.5)
nRBC: 0 % (ref 0.0–0.2)

## 2021-05-26 LAB — GLUCOSE, CAPILLARY
Glucose-Capillary: 121 mg/dL — ABNORMAL HIGH (ref 70–99)
Glucose-Capillary: 116 mg/dL — ABNORMAL HIGH (ref 70–99)
Glucose-Capillary: 141 mg/dL — ABNORMAL HIGH (ref 70–99)
Glucose-Capillary: 126 mg/dL — ABNORMAL HIGH (ref 70–99)

## 2021-05-26 LAB — URINE CULTURE

## 2021-05-26 LAB — MAGNESIUM: Magnesium: 2.2 mg/dL (ref 1.7–2.4)

## 2021-05-26 MED ORDER — HYDRALAZINE HCL 20 MG/ML IJ SOLN
20.0000 mg | Freq: Once | INTRAMUSCULAR | Status: AC
  Administered 2021-05-26: 20 mg via INTRAVENOUS
  Filled 2021-05-26: qty 1

## 2021-05-26 MED ORDER — ONDANSETRON HCL 4 MG/2ML IJ SOLN
4.0000 mg | Freq: Four times a day (QID) | INTRAMUSCULAR | Status: DC | PRN
Start: 2021-05-26 — End: 2021-06-07
  Administered 2021-05-26 – 2021-06-01 (×2): 4 mg via INTRAVENOUS
  Filled 2021-05-26 (×2): qty 2

## 2021-05-26 MED ORDER — AMLODIPINE BESYLATE 5 MG PO TABS
5.0000 mg | ORAL_TABLET | Freq: Once | ORAL | Status: AC
  Administered 2021-05-26: 5 mg via ORAL
  Filled 2021-05-26: qty 1

## 2021-05-26 MED ORDER — HYDRALAZINE HCL 20 MG/ML IJ SOLN
10.0000 mg | INTRAMUSCULAR | Status: DC | PRN
  Administered 2021-05-26 – 2021-05-28 (×5): 10 mg via INTRAVENOUS
  Filled 2021-05-26 (×5): qty 1

## 2021-05-26 MED ORDER — PANTOPRAZOLE SODIUM 40 MG IV SOLR
40.0000 mg | Freq: Two times a day (BID) | INTRAVENOUS | Status: DC
  Administered 2021-05-26 – 2021-05-30 (×8): 40 mg via INTRAVENOUS
  Filled 2021-05-26 (×8): qty 40

## 2021-05-26 MED ORDER — AMLODIPINE BESYLATE 5 MG PO TABS
5.0000 mg | ORAL_TABLET | Freq: Every day | ORAL | Status: DC
  Administered 2021-05-26: 5 mg via ORAL
  Filled 2021-05-26: qty 1

## 2021-05-26 MED ORDER — AMLODIPINE BESYLATE 10 MG PO TABS
10.0000 mg | ORAL_TABLET | Freq: Every day | ORAL | Status: DC
  Administered 2021-05-30 – 2021-06-06 (×8): 10 mg via ORAL
  Filled 2021-05-26 (×9): qty 1

## 2021-05-26 NOTE — Progress Notes (Signed)
Patient noted to be extremely lethargic today. BP elevated as well as heart rate. Patient vomited about a small amount of brown emesis. Dr. Louanne Belton notified new orders received.

## 2021-05-26 NOTE — Progress Notes (Signed)
    Telemetry reviewed, no significant tachy or brady episodes since starting propranolol. BP's remain elevated, can start norvasc 5mg  daily. No additional recs today.       For questions or updates, please contact [place] Please consult [email or web address] for contact info under        Signed, Carlyle Dolly, MD  05/26/2021, 8:55 AM

## 2021-05-26 NOTE — Progress Notes (Signed)
PROGRESS NOTE  Anna Hoffman BOF:[id number] DOB: 1934/12/10 DOA: 05/22/2021 PCP: Glendale Chard, MD   LOS: 3 days   Brief narrative: Anna Hoffman is a 86 y.o. female with medical history significant of esophagitis, CKD stage IIIa, hypertension, colitis, hypertrophic cardiomyopathy and intracerebral hemorrhage, diabetes presented to hospital with nausea vomiting and weakness for 2 to 3 days with difficulty ambulating and confusion for few weeks.  She also had a fall with some trauma to the right shoulder.  In the ED, patient was mildly tachycardic and hypertensive.  Creatinine was elevated at 1.82 from baseline 1.2.  There was mild leukocytosis but hemoglobin was 16.1.  X-ray of chest, CT head scan and pelvic scan were negative for acute findings except for left renal angiomyolipoma.  Patient received vancomycin cefepime and Flagyl in the ED and was admitted to hospital after IV fluid bolus.    At this time, his cultures were negative antibiotics will be discontinued.   Patient continues to be weak and debilitated and PT has recommended skilled nursing facility placement.  Cardiology has seen the patient  for her history of intermittent heart block and question hypertrophic cardiomyopathy and has been adjusted on medications..  Assessment/Plan:  Principal Problem:   SIRS (systemic inflammatory response syndrome) ([name]) Active Problems:   Benign essential HTN   Type 2 diabetes mellitus with stage 3 chronic kidney disease, without long-term current use of insulin ([name])   Mixed hyperlipidemia   Acute renal failure superimposed on stage 3a chronic kidney disease ([name])   Prolonged QT interval   Dehydration   HOCM (hypertrophic obstructive cardiomyopathy) ([place])   History of CVA (cerebrovascular accident)   Sepsis ([place])   Malnutrition of moderate degree   SIRS. SIRS criteria on presentation with tachycardia, elevated lactate leukocytosis.  Could be from ongoing nausea and vomiting.  CT scan  of the abdomen pelvis without any acute findings.   MRSA PCR negative.  COVID influenza negative.    Blood cultures and urine cultures negative so far.  We will discontinue cefepime   AKI on CKD 3 Baseline creatinine of around 1.2.  Creatinine was 1.6 on presentation.   Creatinine of 1.5 today.  We will continue to monitor.  Hypertrophic cardiomyopathy with history of Intermittent second degree heart block Cardiology on board.  Currently on hydralazine and propanolol.  Plan for pacemaker placement as outpatient as per cardiology.  Last known 2D echocardiogram with EF 77-82% with G1 diastolic dysfunction and focal findings.  Patient does have history of syncope secondary to intermittent secondary heart block and has plans for pacemaker placement in the future. .    Essential hypertension  On Hydralazine and propranolol.  We will continue to monitor.  Blood pressure appears to be elevated.  Diabetes mellitus type II Continue sliding scale insulin Accu-Cheks, diabetic diet.  Latest POC glucose of 121.  Hypokalemia.  Improved after replacement.   Hyperlipidemia Continue Crestor  Recent fall, debility weakness.  PT and OT has seen the patient and recommended skilled nursing facility placement.  Right shoulder pain from recent fall and history of rotator cuff tear.  Continue K pad local analgesic cream Tylenol.  X-ray of the right shoulder without any fracture.  Continue physical therapy.  Moderate protein calorie malnutrition.  Present on admission.  Dietary on board.  Continue nutritional supplements.  Disposition.  At this time patient has been seen by physical therapy Occupational Therapy who recommended skilled nursing facility placement on discharge.  DVT prophylaxis: enoxaparin (LOVENOX) injection 30 mg  Start: 05/23/21 0600  Code Status: Full code  Family Communication:  Spoke with the patient's son at bedside on 05/24/2021  Status is: Inpatient  The patient is inpatient  because: need for skilled nursing facility placement.   Consultants: Cardiology  Procedures: None  Anti-infectives:  Cefepime - will dc  Subjective:  Today, patient was seen and examined at bedside.  Complains of mild shoulder pain.  Poor historian.  Underlying dementia.  Objective: Vitals:   05/26/21 1037 05/26/21 1044  BP: (!) 194/116 (!) 194/116  Pulse: (!) 108 (!) 108  Resp:  18  Temp:    SpO2:  96%    Intake/Output Summary (Last 24 hours) at 05/26/2021 1117 Last data filed at 05/26/2021 0900 Gross per 24 hour  Intake 123 ml  Output 700 ml  Net -577 ml    Filed Weights   05/23/21 0237  Weight: 61.4 kg   Body mass index is 22.53 kg/m.   Physical Exam: General:  Average built, not in obvious distress, alert awake but has underlying dementia HENT:   No scleral pallor or icterus noted. Oral mucosa is moist.  Chest:   Diminished breath sounds bilaterally. No crackles or wheezes.  CVS: S1 &S2 heard. No murmur.  Regular rate and rhythm. Abdomen: Soft, nontender, nondistended.  Bowel sounds are heard.   Extremities: No cyanosis, clubbing or edema.  Peripheral pulses are palpable.   restriction of the right shoulder movement. Psych: Alert, awake and has underlying dementia. CNS:  No cranial nerve deficits.  Power equal in all extremities.   Skin: Warm and dry.  No rashes noted.  Data Review: I have personally reviewed the following laboratory data and studies,  CBC: Recent Labs  Lab 05/22/21 2030 05/23/21 0448 05/24/21 0426 05/25/21 0704 05/26/21 0454  WBC 17.4* 14.6* 10.6* 10.1 11.8*  NEUTROABS 15.1*  --   --   --   --   HGB 16.1* 12.7 12.5 13.7 14.6  HCT 48.6* 39.5 37.4 41.4 43.8  MCV 88.2 90.6 87.8 88.8 87.1  PLT 391 316 345 397 435*    Basic Metabolic Panel: Recent Labs  Lab 05/22/21 2030 05/22/21 2042 05/23/21 0448 05/24/21 0426 05/25/21 0704 05/26/21 0454  NA 137  --  132* 132* 133* 130*  K 4.4  --  3.9 3.2* 4.0 3.8  CL 109  --  108  106 105 103  CO2 13*  --  13* 15* 18* 16*  GLUCOSE 150*  --  121* 98 104* 120*  BUN 38*  --  33* 29* 25* 26*  CREATININE 1.86*  --  1.70* 1.57* 1.52* 1.55*  CALCIUM 10.5*  --  9.1 9.1 9.5 9.8  MG  --  2.5*  --  2.0 2.1 2.2    Liver Function Tests: Recent Labs  Lab 05/22/21 2030 05/23/21 0448  AST 19 17  ALT 17 13  ALKPHOS 72 54  BILITOT 0.8 0.8  PROT 8.8* 6.9  ALBUMIN 5.0 3.8    Recent Labs  Lab 05/22/21 2030  LIPASE 26    No results for input(s): AMMONIA in the last 168 hours. Cardiac Enzymes: No results for input(s): CKTOTAL, CKMB, CKMBINDEX, TROPONINI in the last 168 hours. BNP (last 3 results) No results for input(s): BNP in the last 8760 hours.  ProBNP (last 3 results) No results for input(s): PROBNP in the last 8760 hours.  CBG: Recent Labs  Lab 05/25/21 0725 05/25/21 1125 05/25/21 1629 05/25/21 2123 05/26/21 0815  GLUCAP 89 125* 89 103* 121*  Recent Results (from the past 240 hour(s))  Culture, Urine     Status: None   Collection Time: 05/22/21 12:18 PM   Specimen: Urine   UR  Result Value Ref Range Status   Urine Culture, Routine Final report  Final   Organism ID, Bacteria Comment  Final    Comment: Culture shows less than 10,000 colony forming units of bacteria per milliliter of urine. This colony count is not generally considered to be clinically significant.   Blood Culture (routine x 2)     Status: None (Preliminary result)   Collection Time: 05/22/21  9:58 PM   Specimen: Right Antecubital; Blood  Result Value Ref Range Status   Specimen Description   Final    RIGHT ANTECUBITAL Performed at [facility] [address], [place], [place] [address]    Special Requests   Final    BOTTLES DRAWN AEROBIC AND ANAEROBIC Blood Culture results may not be optimal due to an inadequate volume of blood received in culture bottles Performed at [facility] [address] [place], [place] [address]     Culture   Final    NO GROWTH 2 DAYS Performed at [facility] [address] [place], [place] [address]    Report Status PENDING  Incomplete  Blood Culture (routine x 2)     Status: None (Preliminary result)   Collection Time: 05/22/21  9:59 PM   Specimen: BLOOD LEFT HAND  Result Value Ref Range Status   Specimen Description   Final    BLOOD LEFT HAND Performed at [facility] [address], [place], [place] [address]    Special Requests   Final    BOTTLES DRAWN AEROBIC ONLY Blood Culture results may not be optimal due to an inadequate volume of blood received in culture bottles Performed at [facility] [address] [place], [place] [address]    Culture   Final    NO GROWTH 2 DAYS Performed at [facility] [address], [place], [place] [address]    Report Status PENDING  Incomplete  Urine Culture     Status: None   Collection Time: 05/23/21 12:30 AM   Specimen: In/Out Cath Urine  Result Value Ref Range Status   Specimen Description   Final    IN/OUT CATH URINE Performed at [facility] [address], [place], [place] [address]    Special Requests   Final    NONE Performed at [facility], [place] [address], [place], [place] [address]    Culture   Final    NO GROWTH Performed at [facility], [place] [address] [place],  [address]    Report Status 05/24/2021 FINAL  Final  Surgical pcr screen     Status: Abnormal   Collection Time: 05/23/21 12:49 AM   Specimen: Nasal Mucosa; Nasal Swab  Result Value Ref Range Status   MRSA, PCR NEGATIVE NEGATIVE Final   Staphylococcus aureus POSITIVE (A) NEGATIVE Final    Comment: (NOTE) The Xpert SA Assay (FDA approved for NASAL specimens in patients 78 years of age and older), is one component of a comprehensive surveillance program. It is not intended to diagnose infection nor to guide or monitor treatment. Performed at  [facility], [place] [address], [place],  [address]   Resp Panel by RT-PCR (Flu A&B, Covid) Nasal Mucosa     Status: None  Collection Time: 05/23/21 12:49 AM   Specimen: Nasal Mucosa; Nasopharyngeal(NP) swabs in vial transport medium  Result Value Ref Range Status   SARS Coronavirus 2 by RT PCR NEGATIVE NEGATIVE Final    Comment: (NOTE) SARS-CoV-2 target nucleic acids are NOT DETECTED.  The SARS-CoV-2 RNA is generally detectable in upper respiratory specimens during the acute phase of infection. The lowest concentration of SARS-CoV-2 viral copies this assay can detect is 138 copies/mL. A negative result does not preclude SARS-Cov-2 infection and should not be used as the sole basis for treatment or other patient management decisions. A negative result may occur with  improper specimen collection/handling, submission of specimen other than nasopharyngeal swab, presence of viral mutation(s) within the areas targeted by this assay, and inadequate number of viral copies(<138 copies/mL). A negative result must be combined with clinical observations, patient history, and epidemiological information. The expected result is Negative.  Fact Sheet for Patients:  EntrepreneurPulse.com.au  Fact Sheet for Healthcare Providers:  IncredibleEmployment.be  This test is no t yet approved or cleared by the [place] FDA and  has been authorized for detection and/or diagnosis of SARS-CoV-2 by FDA under an Emergency Use Authorization (EUA). This EUA will remain  in effect (meaning this test can be used) for the duration of the COVID-19 declaration under Section 564(b)(1) of the Act, 21 U.S.C.section 360bbb-3(b)(1), unless the authorization is terminated  or revoked sooner.       Influenza A by PCR NEGATIVE NEGATIVE Final   Influenza B by PCR NEGATIVE NEGATIVE Final    Comment: (NOTE) The Xpert Xpress SARS-CoV-2/FLU/RSV plus assay  is intended as an aid in the diagnosis of influenza from Nasopharyngeal swab specimens and should not be used as a sole basis for treatment. Nasal washings and aspirates are unacceptable for Xpert Xpress SARS-CoV-2/FLU/RSV testing.  Fact Sheet for Patients: EntrepreneurPulse.com.au  Fact Sheet for Healthcare Providers: IncredibleEmployment.be  This test is not yet approved or cleared by the [place] FDA and has been authorized for detection and/or diagnosis of SARS-CoV-2 by FDA under an Emergency Use Authorization (EUA). This EUA will remain in effect (meaning this test can be used) for the duration of the COVID-19 declaration under Section 564(b)(1) of the Act, 21 U.S.C. section 360bbb-3(b)(1), unless the authorization is terminated or revoked.  Performed at [facility], [place] [address] [place], [place] [address]       Studies: No results found.   Flora Lipps, MD  [facility] 05/26/2021  If 7PM-7AM, please contact night-coverage

## 2021-05-27 ENCOUNTER — Telehealth: Payer: Medicare Other

## 2021-05-27 ENCOUNTER — Inpatient Hospital Stay (HOSPITAL_COMMUNITY): Payer: Medicare Other

## 2021-05-27 ENCOUNTER — Inpatient Hospital Stay (HOSPITAL_COMMUNITY)
Admit: 2021-05-27 | Discharge: 2021-05-27 | Disposition: A | Discharge status: Home / Self Care | Payer: Medicare Other | Attending: Neurology | Admitting: Neurology

## 2021-05-27 DIAGNOSIS — R4182 Altered mental status, unspecified: Secondary | ICD-10-CM | POA: Diagnosis not present

## 2021-05-27 DIAGNOSIS — I10 Essential (primary) hypertension: Secondary | ICD-10-CM | POA: Diagnosis not present

## 2021-05-27 DIAGNOSIS — Z8673 Personal history of transient ischemic attack (TIA), and cerebral infarction without residual deficits: Secondary | ICD-10-CM

## 2021-05-27 DIAGNOSIS — E860 Dehydration: Secondary | ICD-10-CM | POA: Diagnosis not present

## 2021-05-27 DIAGNOSIS — N179 Acute kidney failure, unspecified: Secondary | ICD-10-CM

## 2021-05-27 DIAGNOSIS — R6510 Systemic inflammatory response syndrome (SIRS) of non-infectious origin without acute organ dysfunction: Secondary | ICD-10-CM | POA: Diagnosis not present

## 2021-05-27 DIAGNOSIS — N1831 Chronic kidney disease, stage 3a: Secondary | ICD-10-CM

## 2021-05-27 LAB — MAGNESIUM: Magnesium: 2.7 mg/dL — ABNORMAL HIGH (ref 1.7–2.4)

## 2021-05-27 LAB — LIPID PANEL
Cholesterol: 139 mg/dL (ref 0–200)
HDL: 74 mg/dL (ref >40)
LDL Cholesterol: 47 mg/dL (ref 0–99)
Total CHOL/HDL Ratio: 1.9 RATIO
Triglycerides: 89 mg/dL (ref <150)
VLDL: 18 mg/dL (ref 0–40)

## 2021-05-27 LAB — BASIC METABOLIC PANEL
Anion gap: 17 — ABNORMAL HIGH (ref 5–15)
BUN: 40 mg/dL — ABNORMAL HIGH (ref 8–23)
CO2: 13 mmol/L — ABNORMAL LOW (ref 22–32)
Calcium: 10.1 mg/dL (ref 8.9–10.3)
Chloride: 104 mmol/L (ref 98–111)
Creatinine, Ser: 2.14 mg/dL — ABNORMAL HIGH (ref 0.44–1.00)
GFR, Estimated: 22 mL/min — ABNORMAL LOW (ref >60)
Glucose, Bld: 140 mg/dL — ABNORMAL HIGH (ref 70–99)
Potassium: 4.2 mmol/L (ref 3.5–5.1)
Sodium: 134 mmol/L — ABNORMAL LOW (ref 135–145)

## 2021-05-27 LAB — CBC
HCT: 46 % (ref 36.0–46.0)
Hemoglobin: 15.2 g/dL — ABNORMAL HIGH (ref 12.0–15.0)
MCH: 29.2 pg (ref 26.0–34.0)
MCHC: 33 g/dL (ref 30.0–36.0)
MCV: 88.5 fL (ref 80.0–100.0)
Platelets: 476 10*3/uL — ABNORMAL HIGH (ref 150–400)
RBC: 5.2 MIL/uL — ABNORMAL HIGH (ref 3.87–5.11)
RDW: 15.9 % — ABNORMAL HIGH (ref 11.5–15.5)
WBC: 13.9 10*3/uL — ABNORMAL HIGH (ref 4.0–10.5)
nRBC: 0 % (ref 0.0–0.2)

## 2021-05-27 LAB — FOLATE: Folate: 19.6 ng/mL (ref >5.9)

## 2021-05-27 LAB — GLUCOSE, CAPILLARY
Glucose-Capillary: 177 mg/dL — ABNORMAL HIGH (ref 70–99)
Glucose-Capillary: 154 mg/dL — ABNORMAL HIGH (ref 70–99)
Glucose-Capillary: 112 mg/dL — ABNORMAL HIGH (ref 70–99)

## 2021-05-27 LAB — AMMONIA: Ammonia: 24 umol/L (ref 9–35)

## 2021-05-27 MED ORDER — LACTATED RINGERS IV SOLN: INTRAVENOUS | Status: DC

## 2021-05-27 MED ORDER — THIAMINE HCL 100 MG/ML IJ SOLN
100.0000 mg | Freq: Every day | INTRAMUSCULAR | Status: DC
  Administered 2021-05-27 – 2021-05-30 (×4): 100 mg via INTRAVENOUS
  Filled 2021-05-27 (×3): qty 2

## 2021-05-27 MED ORDER — THIAMINE HCL 100 MG PO TABS
100.0000 mg | ORAL_TABLET | Freq: Every day | ORAL | Status: DC
  Administered 2021-05-31 – 2021-06-06 (×7): 100 mg via ORAL
  Filled 2021-05-27 (×7): qty 1

## 2021-05-27 NOTE — Procedures (Signed)
Patient Name: Lily Velasquez  MRN: [id number]  Epilepsy Attending: Lora Havens  Referring Physician/Provider: Dr Donnetta Simpers Date: 05/27/2021 Duration: 22.48 mins  Patient history: 86 year old female with altered mental status.  EEG to evaluate for seizures.  Level of alertness: Awake  AEDs during EEG study: None  Technical aspects: This EEG study was done with scalp electrodes positioned according to the 10-20 International system of electrode placement. Electrical activity was acquired at a sampling rate of 500Hz  and reviewed with a high frequency filter of 70Hz  and a low frequency filter of 1Hz . EEG data were recorded continuously and digitally stored.   Description: No posterior dominant rhythm was seen. EEG showed continuous generalized 3 to 6 Hz theta-delta slowing. Generalized periodic discharges with triphasic morphology at  1Hz  were also noted, more prominent when awake/stimulated. Hyperventilation and photic stimulation were not performed.     ABNORMALITY - Periodic discharges with triphasic morphology, generalized ( GPDs) - Continuous slow, generalized  IMPRESSION: This study showed generalized periodic discharges with triphasic morphology at  1Hz  which is on the ictal-interictal continuum. However, the morphology, frequency and reactivity to stimulation is more commonly seen due to toxic-metabolic causes like cefepime toxicity, hyerammonemia, renal failure. Additionally there is moderate to severe diffuse encephalopathy, nonspecific etiology. No seizures were seen throughout the recording.   Koryn Charlot Barbra Sarks

## 2021-05-27 NOTE — Progress Notes (Signed)
Attempted PO medications this am. Pt held medications in mouth. Will hold PO's for now. Will update provider

## 2021-05-27 NOTE — Progress Notes (Signed)
EEG completed, results pending. 

## 2021-05-27 NOTE — Progress Notes (Addendum)
PT Cancellation Note  Patient Details Name: Florita Nitsch MRN: [id number] DOB: [birth year]   Cancelled Treatment:    Reason Eval/Treat Not Completed: Medical issues which prohibited therapy. Pt with increased HR and increased BP this a.m. Will hold PT tx session at this time. Will follow up with RN to see if pt may be appropriate for PT session later today.  Addendum: Will hold PT today. HR still elevated and MRI has been ordered. Will check back another day    Doreatha Massed, PT Acute Rehabilitation  Office: [phone] Pager: [phone]

## 2021-05-27 NOTE — Progress Notes (Addendum)
PROGRESS NOTE  Anna Hoffman TKW:[id number] DOB: 03/01/1935 DOA: 05/22/2021 PCP: Glendale Chard, MD   LOS: 4 days   Brief narrative:  Anna Hoffman is a 86 y.o. female with medical history significant of esophagitis, CKD stage IIIa, hypertension, colitis, hypertrophic cardiomyopathy and intracerebral hemorrhage, diabetes presented to hospital with nausea vomiting and weakness for 2 to 3 days with difficulty ambulating and confusion for few weeks.  She also had a fall with some trauma to the right shoulder.  In the ED, patient was mildly tachycardic and hypertensive.  Creatinine was elevated at 1.82 from baseline 1.2.  There was mild leukocytosis but hemoglobin was 16.1.  X-ray of chest, CT head scan and pelvic scan were negative for acute findings except for left renal angiomyolipoma.  Patient received vancomycin cefepime and Flagyl in the ED and was admitted to hospital after IV fluid bolus.    At this time, cultures have been negative antibiotics have been discontinued after completion of 3-day course of cefepime.  Patient continues to be weak and debilitated and PT has recommended skilled nursing facility placement.  Cardiology has seen the patient  for her history of intermittent heart block and question hypertrophic cardiomyopathy and has been adjusted on medications. On  05/26/2021, patient had accelerated hypertension and was less responsive.    Assessment/Plan:  Principal Problem:   SIRS (systemic inflammatory response syndrome) ([name]) Active Problems:   Benign essential HTN   Type 2 diabetes mellitus with stage 3 chronic kidney disease, without long-term current use of insulin ([name])   Mixed hyperlipidemia   Acute renal failure superimposed on stage 3a chronic kidney disease ([name])   Prolonged QT interval   Dehydration   HOCM (hypertrophic obstructive cardiomyopathy) ([place])   History of CVA (cerebrovascular accident)   Sepsis ([place])   Malnutrition of moderate degree   SIRS. SIRS  criteria on presentation with tachycardia, elevated lactate leukocytosis.  Could be from ongoing nausea and vomiting.  CT scan of the abdomen pelvis without any acute findings.   MRSA PCR negative.  COVID influenza negative.    Blood cultures and urine cultures negative so far.  We will discontinue cefepime   Altered mental status possible metabolic encephalopathy.  Differentials include hypertensive encephalopathy, stroke.  Had SIRS criteria so possibility of meningeal infection but was responsive initially.  Patient was nonverbal yesterday and today but is more alert today.  ABG showed no hypercarbia.  CT head scan showed microvascular disease.  Patient appears to have slightly less movement on the left side so there is possibility of stroke.  We will get MRI of the brain.  Have spoken with neurology for consultation. Will get speech evaluation for swallow safety.  AKI on CKD 3 Baseline creatinine of around 1.2.  Creatinine was 1.6 on presentation.   Creatinine of 2.1 today.  We will continue to monitor closely.  Intake and output charting and daily weights.  We will start on IV fluids today.  Bladder scan today.  Hypertrophic cardiomyopathy with history of Intermittent second degree heart block, tachybradycardia syndrome. Cardiology on board.  Currently on hydralazine and propanolol.  Plan for pacemaker placement as outpatient as per cardiology.  Last known 2D echocardiogram with EF 92-42% with G1 diastolic dysfunction and focal findings.  Patient does have history of syncope secondary to intermittent secondary heart block and has plans for pacemaker placement in the future.  Spoke with cardiology yesterday regarding medications and patient is currently on amlodipine and propanolol.  Essential hypertension with accelerated hypertension.  On Hydralazine and propranolol.  With cardiology about the choice of antihypertensive.  Avoiding ACE inhibitor's ARB's  Diabetes mellitus type II Continue sliding  scale insulin, Accu-Cheks, diabetic diet.  Latest POC glucose of 154  Hypokalemia.  Improved after replacement.  Potassium level of 4.2.   Hyperlipidemia Continue Crestor  Recent fall, debility weakness.  PT and OT has seen the patient and recommended skilled nursing facility placement.  Right shoulder pain from recent fall and history of rotator cuff tear.  Continue K pad local analgesic cream Tylenol.  X-ray of the right shoulder without any fracture.  Continue physical therapy.  Moderate protein calorie malnutrition.  Present on admission.  Dietary on board.  Continue nutritional supplements.  Has kept n.p.o. due to nonverbal status.  Disposition.  Occupational Therapy recommended skilled nursing facility placement on discharge.  DVT prophylaxis: enoxaparin (LOVENOX) injection 30 mg Start: 05/23/21 0600  Code Status: Full code  Family Communication:  Spoke with the patient's son on the phone and updated him about the clinical condition of the patient.  Status is: Inpatient  The patient is inpatient because: Altered mental status, nonverbal status, need for skilled nursing facility placement.   Consultants: Cardiology Neurology  Procedures: None  Anti-infectives:  Cefepime - will dc  Subjective:  Today, patient was seen and examined at bedsi today, patient was seen and examined at bedside.  Nonverbal today.  More alert today yesterday was little lethargic.  Diminished urinary output noted.   Objective: Vitals:   05/27/21 0749 05/27/21 0749  BP: (!) 170/97   Pulse: (!) 139 (!) 137  Resp: 20   Temp:  98.7 F (37.1 C)  SpO2: 96% 96%    Intake/Output Summary (Last 24 hours) at 05/27/2021 0928 Last data filed at 05/26/2021 1200 Gross per 24 hour  Intake 0 ml  Output --  Net 0 ml    Filed Weights   05/23/21 0237  Weight: 61.4 kg   Body mass index is 22.53 kg/m.   Physical Exam: General:  Average built, not in obvious distress, opens eyes and tracks  but nonverbal. HENT:   No scleral pallor or icterus noted. Oral mucosa is moist.  Chest:   Diminished breath sounds bilaterally. No crackles or wheezes.  CVS: S1 &S2 heard. No murmur.  Regular rate and rhythm. Abdomen: Soft, nontender, nondistended.  Bowel sounds are heard.   Extremities: No cyanosis, clubbing or edema.  Peripheral pulses are palpable.    Psych: Opens eyes spontaneously, CNS: slightly move right side of the body.  Less movement on the left.  Nonverbal today. Skin: Warm and dry.  No rashes noted.  Data Review: I have personally reviewed the following laboratory data and studies,  CBC: Recent Labs  Lab 05/22/21 2030 05/23/21 0448 05/24/21 0426 05/25/21 0704 05/26/21 0454 05/27/21 0423  WBC 17.4* 14.6* 10.6* 10.1 11.8* 13.9*  NEUTROABS 15.1*  --   --   --   --   --   HGB 16.1* 12.7 12.5 13.7 14.6 15.2*  HCT 48.6* 39.5 37.4 41.4 43.8 46.0  MCV 88.2 90.6 87.8 88.8 87.1 88.5  PLT 391 316 345 397 435* 476*    Basic Metabolic Panel: Recent Labs  Lab 05/22/21 2042 05/23/21 0448 05/24/21 0426 05/25/21 0704 05/26/21 0454 05/27/21 0423  NA  --  132* 132* 133* 130* 134*  K  --  3.9 3.2* 4.0 3.8 4.2  CL  --  108 106 105 103 104  CO2  --  13* 15* 18* 16*  13*  GLUCOSE  --  121* 98 104* 120* 140*  BUN  --  33* 29* 25* 26* 40*  CREATININE  --  1.70* 1.57* 1.52* 1.55* 2.14*  CALCIUM  --  9.1 9.1 9.5 9.8 10.1  MG 2.5*  --  2.0 2.1 2.2 2.7*    Liver Function Tests: Recent Labs  Lab 05/22/21 2030 05/23/21 0448  AST 19 17  ALT 17 13  ALKPHOS 72 54  BILITOT 0.8 0.8  PROT 8.8* 6.9  ALBUMIN 5.0 3.8    Recent Labs  Lab 05/22/21 2030  LIPASE 26    No results for input(s): AMMONIA in the last 168 hours. Cardiac Enzymes: No results for input(s): CKTOTAL, CKMB, CKMBINDEX, TROPONINI in the last 168 hours. BNP (last 3 results) No results for input(s): BNP in the last 8760 hours.  ProBNP (last 3 results) No results for input(s): PROBNP in the last 8760  hours.  CBG: Recent Labs  Lab 05/26/21 0815 05/26/21 1144 05/26/21 1622 05/26/21 2129 05/27/21 0851  GLUCAP 121* 116* 141* 126* 177*    Recent Results (from the past 240 hour(s))  Culture, Urine     Status: None   Collection Time: 05/22/21 12:18 PM   Specimen: Urine   UR  Result Value Ref Range Status   Urine Culture, Routine Final report  Final   Organism ID, Bacteria Comment  Final    Comment: Culture shows less than 10,000 colony forming units of bacteria per milliliter of urine. This colony count is not generally considered to be clinically significant.   Blood Culture (routine x 2)     Status: None (Preliminary result)   Collection Time: 05/22/21  9:58 PM   Specimen: Right Antecubital; Blood  Result Value Ref Range Status   Specimen Description   Final    RIGHT ANTECUBITAL Performed at [facility] [address] [place], [place] [address]    Special Requests   Final    BOTTLES DRAWN AEROBIC AND ANAEROBIC Blood Culture results may not be optimal due to an inadequate volume of blood received in culture bottles Performed at [facility] [address] [place], [place] [address]    Culture   Final    NO GROWTH 4 DAYS Performed at [facility], [place] [address] [place], [place] [address]    Report Status PENDING  Incomplete  Blood Culture (routine x 2)     Status: None (Preliminary result)   Collection Time: 05/22/21  9:59 PM   Specimen: BLOOD LEFT HAND  Result Value Ref Range Status   Specimen Description   Final    BLOOD LEFT HAND Performed at [facility] [address], [place], [place] [address]    Special Requests   Final    BOTTLES DRAWN AEROBIC ONLY Blood Culture results may not be optimal due to an inadequate volume of blood received in culture bottles Performed at [facility] [address], [place], [place] [address]    Culture   Final    NO GROWTH 4  DAYS Performed at [facility], [place] [address] [place],  [address]    Report Status PENDING  Incomplete  Urine Culture     Status: None   Collection Time: 05/23/21 12:30 AM   Specimen: In/Out Cath Urine  Result Value Ref Range Status   Specimen Description   Final    IN/OUT CATH URINE Performed at [facility]  [address], [place], [place] [address]    Special Requests   Final    NONE Performed at [facility], [place] [address], [place], [place] [address]    Culture   Final    NO GROWTH Performed at [facility], [place] [address] [place], [place] [address]    Report Status 05/24/2021 FINAL  Final  Surgical pcr screen     Status: Abnormal   Collection Time: 05/23/21 12:49 AM   Specimen: Nasal Mucosa; Nasal Swab  Result Value Ref Range Status   MRSA, PCR NEGATIVE NEGATIVE Final   Staphylococcus aureus POSITIVE (A) NEGATIVE Final    Comment: (NOTE) The Xpert SA Assay (FDA approved for NASAL specimens in patients 32 years of age and older), is one component of a comprehensive surveillance program. It is not intended to diagnose infection nor to guide or monitor treatment. Performed at [facility], [place] [address] [place], [place] [address]   Resp Panel by RT-PCR (Flu A&B, Covid) Nasal Mucosa     Status: None   Collection Time: 05/23/21 12:49 AM   Specimen: Nasal Mucosa; Nasopharyngeal(NP) swabs in vial transport medium  Result Value Ref Range Status   SARS Coronavirus 2 by RT PCR NEGATIVE NEGATIVE Final    Comment: (NOTE) SARS-CoV-2 target nucleic acids are NOT DETECTED.  The SARS-CoV-2 RNA is generally detectable in upper respiratory specimens during the acute phase of infection. The lowest concentration of SARS-CoV-2 viral copies this assay can detect is 138 copies/mL. A negative result does not preclude SARS-Cov-2 infection and should not be used as the sole basis for treatment or other  patient management decisions. A negative result may occur with  improper specimen collection/handling, submission of specimen other than nasopharyngeal swab, presence of viral mutation(s) within the areas targeted by this assay, and inadequate number of viral copies(<138 copies/mL). A negative result must be combined with clinical observations, patient history, and epidemiological information. The expected result is Negative.  Fact Sheet for Patients:  EntrepreneurPulse.com.au  Fact Sheet for Healthcare Providers:  IncredibleEmployment.be  This test is no t yet approved or cleared by the [place] FDA and  has been authorized for detection and/or diagnosis of SARS-CoV-2 by FDA under an Emergency Use Authorization (EUA). This EUA will remain  in effect (meaning this test can be used) for the duration of the COVID-19 declaration under Section 564(b)(1) of the Act, 21 U.S.C.section 360bbb-3(b)(1), unless the authorization is terminated  or revoked sooner.       Influenza A by PCR NEGATIVE NEGATIVE Final   Influenza B by PCR NEGATIVE NEGATIVE Final    Comment: (NOTE) The Xpert Xpress SARS-CoV-2/FLU/RSV plus assay is intended as an aid in the diagnosis of influenza from Nasopharyngeal swab specimens and should not be used as a sole basis for treatment. Nasal washings and aspirates are unacceptable for Xpert Xpress SARS-CoV-2/FLU/RSV testing.  Fact Sheet for Patients: EntrepreneurPulse.com.au  Fact Sheet for Healthcare Providers: IncredibleEmployment.be  This test is not yet approved or cleared by the [place] FDA and has been authorized for detection and/or diagnosis of SARS-CoV-2 by FDA under an Emergency Use Authorization (EUA). This EUA will remain in effect (meaning this test can be used) for the duration of the COVID-19 declaration under Section 564(b)(1) of the Act, 21 U.S.C. section  360bbb-3(b)(1), unless the authorization is terminated or revoked.  Performed at [facility], [place] [address] [place], [place] [address]       Studies: CT HEAD  WO CONTRAST (5MM)  Result Date: 05/26/2021 CLINICAL DATA:  Mental status change of unknown cause. Lethargy. Hypertension. EXAM: CT HEAD WITHOUT CONTRAST TECHNIQUE: Contiguous axial images were obtained from the base of the skull through the vertex without intravenous contrast. COMPARISON:  05/22/2021.  05/09/2021. FINDINGS: Brain: Old small vessel infarction in the left cerebellum. No focal brainstem finding, other than indentation due to tortuous and ectatic vertebrobasilar vessels. Cerebral hemispheres show old infarction in the right thalamus and extensive chronic small-vessel ischemic changes throughout the cerebral hemispheric white matter. No cortical or large vessel territory infarction. No mass lesion, hemorrhage, hydrocephalus or extra-axial collection. Vascular: There is atherosclerotic calcification of the major vessels at the base of the brain. Skull: Negative Sinuses/Orbits: Chronic inflammatory changes of the left division of the sphenoid sinus. Other sinuses clear. Orbits negative. Other: None IMPRESSION: No acute CT finding. Extensive chronic small-vessel ischemic changes throughout the brain as outlined above. Chronic inflammatory changes of the left division of the sphenoid sinus. Electronically Signed   By: Nelson Chimes M.D.   On: 05/26/2021 17:44     Flora Lipps, MD  [facility] 05/27/2021  If 7PM-7AM, please contact night-coverage

## 2021-05-27 NOTE — Progress Notes (Signed)
Patient remains nonverbal. Per report this is hospital admission baseline. She is alert , eyes open when name called. HR is sustaining 137-140, obtaining EKG now. Hospitalist provider paged with update. Pt has had decreased urine output per nightshift RN report. Bladder scan completed this am 140 cc.   05/27/21 0749  Assess: MEWS Score  BP (!) 170/97  Pulse Rate (!) 139  Resp 20  SpO2 96 %  O2 Device Room Air  Assess: MEWS Score  MEWS Temp 0  MEWS Systolic 0  MEWS Pulse 3  MEWS RR 0  MEWS LOC 1  MEWS Score 4  MEWS Score Color Red  Assess: SIRS CRITERIA  SIRS Temperature  0  SIRS Pulse 1  SIRS Respirations  0  SIRS WBC 0  SIRS Score Sum  1  Will continue to monitor

## 2021-05-27 NOTE — Care Management Important Message (Signed)
Important Message  Patient Details IM Letter given to the Patient. Name: Anna Hoffman MRN: [id number] Date of Birth: 02/09/1935   Medicare Important Message Given:  Yes     Kerin Salen 05/27/2021, 2:17 PM

## 2021-05-27 NOTE — Progress Notes (Signed)
The patient continues to refuse PO medications. She is holding her mouth closed and when taking in POs, she holds them in her mouth. Per cardiology it is not wise to treat the HR aggressively because of her history of advanced heart block for risk of requiring urgent pacemaker. Will continue to monitor.

## 2021-05-27 NOTE — Progress Notes (Signed)
Urinary output remains low. Pt had urine incontinence x 1 occurrence this am. Bladder scan 175 cc completed.

## 2021-05-27 NOTE — Consult Note (Addendum)
NEUROLOGY CONSULTATION NOTE   Date of service: May 27, 2021 Patient Name: Anna Hoffman MRN:  [id number] DOB:  1935/01/20 Reason for consult: "Encphalopathy, concern for L sided weakness" Requesting Provider: Flora Lipps, MD _ _ _   _ __   _ __ _ _  __ __   _ __   __ _  History of Present Illness   Anna Hoffman is a 86 y.o. female with a medical history significant for esophagitis, chronic kidney disease stage IIIa, essential hypertension, colitis, hypertrophic cardiomyopathy, type 2 diabetes mellitus, and hypertrophic cardiomyopathy who presented to [facility] 10/19 for evaluation of one day of nausea, vomiting, and weakness with impaired ambulation from baseline while using her walker and reports of several weeks of confusion. She had a noted fall approximately one week prior to hospital admission with right shoulder pain but was noted to be confused prior to her fall. Work up in the ED was significant for possible sepsis with tachycardia and leukocytosis to 17.4 and other labs were significant for a creatinine elevation from baseline renal function with an AKI on CKD and initial lactic acid elevation of 2. At this time she was started on Vanc and Cefepime and admitted for further evaluation. Initial infectious work up was negative and antibiotic therapy was de-escalated with chronic debilitation and recommendation for SNF placement by PT. On evaluation this morning, patient was noted to be progressively lethargic, nonverbal, not following commands, and with decreased urine output and neurology was consulted for further evaluation of encephalopathy. Of note, her Creatinine uptrended overnight from 1.55 to 2.14.   She is unable to provide any history secondary to encephalopathy and thus most of the history is obtained from chart review and on discussion with the Primary team.  ROS   Unable to obtain a detailed review of system due to encephalopathy.  Past History   Past Medical History:   Diagnosis Date   Arthritis    Diabetes mellitus without complication ([place])    Hypertension    Hypokalemia    Pneumonia    Shingles    Stroke [facility])    Past Surgical History:  Procedure Laterality Date   ABDOMINAL HYSTERECTOMY     BREAST EXCISIONAL BIOPSY Left    ESOPHAGOGASTRODUODENOSCOPY N/A 09/01/2014   Procedure: ESOPHAGOGASTRODUODENOSCOPY (EGD);  Surgeon: Lear Ng, MD;  Location: [facility];  Service: Endoscopy;  Laterality: N/A;   ESOPHAGOGASTRODUODENOSCOPY (EGD) WITH PROPOFOL N/A 10/12/2020   Procedure: ESOPHAGOGASTRODUODENOSCOPY (EGD) WITH PROPOFOL;  Surgeon: Mauri Pole, MD;  Location: [facility];  Service: Endoscopy;  Laterality: N/A;   HIATAL HERNIA REPAIR N/A 09/04/2014   Procedure: LAPAROSCOPIC REPAIR OF HIATAL HERNIA;  Surgeon: Excell Seltzer, MD;  Location: [facility] ORS;  Service: General;  Laterality: N/A;  With MESH   IR GENERIC HISTORICAL  04/10/2016   IR US GUIDE VASC ACCESS RIGHT 04/10/2016 Corrie Mckusick, DO [facility]   IR GENERIC HISTORICAL  04/10/2016   IR ANGIOGRAM SELECTIVE EACH ADDITIONAL VESSEL 04/10/2016 Corrie Mckusick, DO [facility]   IR GENERIC HISTORICAL  04/10/2016   IR ANGIOGRAM SELECTIVE EACH ADDITIONAL VESSEL 04/10/2016 Corrie Mckusick, DO [facility]   IR GENERIC HISTORICAL  04/10/2016   IR EMBO TUMOR ORGAN ISCHEMIA INFARCT INC GUIDE ROADMAPPING 04/10/2016 Corrie Mckusick, DO [facility]   IR GENERIC HISTORICAL  04/10/2016   IR ANGIOGRAM SELECTIVE EACH ADDITIONAL VESSEL 04/10/2016 Corrie Mckusick, DO [facility]   IR GENERIC HISTORICAL  04/10/2016   IR ANGIOGRAM SELECTIVE EACH ADDITIONAL VESSEL 04/10/2016 Corrie Mckusick,  DO [facility]   IR GENERIC HISTORICAL  04/10/2016   IR RENAL SELECTIVE  UNI INC S&I MOD SED 04/10/2016 Corrie Mckusick, DO [facility]   IR GENERIC HISTORICAL  03/06/2016   IR RADIOLOGIST EVAL & MGMT 03/06/2016 Corrie Mckusick, DO [facility]   IR GENERIC HISTORICAL  03/26/2016   IR RADIOLOGIST EVAL & MGMT 03/26/2016 Corrie Mckusick, DO [facility]   IR GENERIC HISTORICAL  04/29/2016   IR RADIOLOGIST EVAL & MGMT 04/29/2016 [facility]   IR RADIOLOGIST EVAL & MGMT  11/12/2016   IR RADIOLOGIST EVAL & MGMT  12/16/2017   KNEE SURGERY     TONSILLECTOMY     Family History  Problem Relation Age of Onset   Alzheimer's disease Mother    Prostate cancer Father    Brain cancer Brother    Brain cancer Brother    Pancreatic cancer Sister    Allergic rhinitis Neg Hx    Asthma Neg Hx    Eczema Neg Hx    Urticaria Neg Hx    Social History   Socioeconomic History   Marital status: Widowed    Spouse name: Not on file   Number of children: 2   Years of education: Not on file   Highest education level: Not on file  Occupational History   Occupation: retired  Tobacco Use   Smoking status: Never   Smokeless tobacco: Never  Vaping Use   Vaping Use: Never used  Substance and Sexual Activity   Alcohol use: No   Drug use: No   Sexual activity: Not Currently    Birth control/protection: Surgical  Other Topics Concern   Not on file  Social History Narrative   Son Anna Hoffman lives with her   Social Determinants of Health   Financial Resource Strain: Low Risk    Difficulty of Paying Living Expenses: Not hard at all  Food Insecurity: No Food Insecurity   Worried About Charity fundraiser in the Last Year: Never true   Arboriculturist in the Last Year: Never true  Transportation Needs: No Transportation Needs   Lack of Transportation (Medical): No   Lack of Transportation (Non-Medical): No  Physical Activity: Sufficiently Active   Days of Exercise per Week: 6 days   Minutes of Exercise per Session: 30 min  Stress: No Stress Concern Present   Feeling of Stress : Not at all  Social Connections: Not on file   Allergies  Allergen Reactions   Amoxicillin Diarrhea    Has patient had a PCN reaction causing immediate rash, facial/tongue/throat swelling, SOB or lightheadedness with hypotension: no Has patient had a PCN  reaction causing severe rash involving mucus membranes or skin necrosis: no Has patient had a PCN reaction that required hospitalization: no pharmacist consult Has patient had a PCN reaction occurring within the last 10 years: yes If all of the above answers are "NO", then may proceed with Cephalosporin use.    Ampicillin Rash    - Tolerates Rocephin and Ancef - Remote occurrence; no symptoms of anaphylaxis or severe cutaneous reaction, and no additional medical attention required     Sulfa Antibiotics Rash    Medications   Facility-Administered Medications Prior to Admission  Medication Dose Route Frequency Provider Last Rate Last Admin   cyanocobalamin ((VITAMIN B-12)) injection 1,000 mcg  1,000 mcg Intramuscular Once [place] Chard, MD       Medications Prior to Admission  Medication Sig Dispense Refill Last Dose  acetaminophen (TYLENOL) 325 MG tablet Take 2 tablets (650 mg total) by mouth every 6 (six) hours as needed for mild pain (or Fever >/= 101).   unk   Ascorbic Acid (VITAMIN C) 1000 MG tablet Take 1,000 mg by mouth daily.   Past Week   azelastine (ASTELIN) 0.1 % nasal spray Place 2 sprays into both nostrils daily as needed for rhinitis.   Past Week   Carboxymethylcellul-Glycerin (LUBRICATING EYE DROPS OP) Place 1 drop into both eyes daily as needed (dry eyes).   Past Week   Cyanocobalamin (VITAMIN B12) 500 MCG TABS Take 500 mcg by mouth daily.   05/21/2021   fexofenadine (ALLEGRA ALLERGY) 180 MG tablet Take 1 tablet (180 mg total) by mouth daily. 90 tablet 2 Past Week   HYDROcodone-acetaminophen (NORCO/VICODIN) 5-325 MG tablet Take 1 tablet by mouth every 6 (six) hours as needed. (Patient taking differently: Take 1 tablet by mouth every 6 (six) hours as needed for moderate pain.) 10 tablet 0 unk   metoprolol tartrate (LOPRESSOR) 50 MG tablet Take 50 mg by mouth 2 (two) times daily.   Past Week at unk   pantoprazole (PROTONIX) 40 MG tablet Give 1 tablet by mouth 2 times a day  related to gastro-esophageal reflux disease without esophagitis 180 tablet 2 05/21/2021   prochlorperazine (COMPAZINE) 5 MG tablet Take 1 tablet (5 mg total) by mouth every 6 (six) hours as needed for nausea or vomiting. 30 tablet 0 05/22/2021   rosuvastatin (CRESTOR) 20 MG tablet Take 1 tablet (20 mg total) by mouth daily. 90 tablet 1 19/62/2297   trolamine salicylate (ASPERCREME) 10 % cream Apply 1 application topically daily as needed for muscle pain.   Past Month   glucose blood test strip Use as directed to check blood sugars 1 time per day. 100 each 11    glucose monitoring kit (FREESTYLE) monitoring kit Use as directed to check blood sugars 1 time per day dx: e11.22 1 each 1    Lancets (ONETOUCH DELICA PLUS [id number]) MISC Use to check blood sugar once daily 100 each 12      Vitals   Vitals:   05/27/21 0644 05/27/21 0749 05/27/21 0749 05/27/21 1018  BP: (!) 144/98 (!) 170/97  (!) 144/91  Pulse: (!) 101 (!) 139 (!) 137 (!) 134  Resp:  20    Temp:   98.7 F (37.1 C)   TempSrc:      SpO2:  96% 96% 93%  Weight:      Height:         Body mass index is 22.53 kg/m.  Physical Exam   General: Laying comfortably in bed; in no acute distress. HENT: Normal oropharynx and mucosa. Normal external appearance of ears and nose. Neck: Supple, no pain or tenderness  CV: No JVD. No peripheral edema.  Pulmonary: Symmetric Chest rise. Normal respiratory effort.  Abdomen: Soft to touch, non-tender.  Ext: No cyanosis, edema, or deformity  Skin: No rash. Normal palpation of skin.   Musculoskeletal: Normal digits and nails by inspection. No clubbing.   Neurologic Examination  Mental status/Cognition: Partially opens eyes to loud voice, will make eye contact on the left side as well as on the right side.  She will not answer any questions so unable to assess orientation.  She will keep her eyes open for 10 to 20 seconds before closing her eyes again.  She was able to give me a thumbs up with her  right hand and was able to stick  her tongue out on command but otherwise does not follow any commands or answer any questions.  Speech/language: No speech during the entire encounter. Cranial nerves:   CN II Pupils equal and reactive to light, blinks to threat bilaterally.   CN III,IV,VI Looks left and looks right.  No gaze preference or deviation and no nystagmus.   CN V Corneal reflexes intact.   CN VII no asymmetry, no nasolabial fold flattening.  Symmetric grimace to pain.   CN VIII    CN IX & X normal palatal elevation, no uvular deviation   CN XI    CN XII midline tongue protrusion   Motor:  Muscle bulk: poor, tone increased extensor tone in all extremities.  She is able to give me a thumbs up with right hand and noted to have spontaneous movement in all extremities.  However some concern that she is moving her left upper extremity and left lower extremity less than her right upper extremity and right lower extremity.  When her upper extremities are held up in the air, they would gradually drift down towards the bed.  Reflexes:  Right Left Comments  Pectoralis      Biceps (C5/6) 2+ 2+   Brachioradialis (C5/6) 2+ 2+    Triceps (C6/7) 2+ 2+    Patellar (L3/4) 3 3    Achilles (S1) clonus 2+    Hoffman      Plantar equivocal equivocal   Jaw jerk    Sensation:  Light touch Grimaces to pain in all extremities.   Pin prick    Temperature    Vibration   Proprioception    Coordination/Complex Motor:  Unable to assess as she is not following any commands for me.  However no obvious ataxia noted with spontaneous movements of all extremities.  Labs   CBC:  Recent Labs  Lab 05/22/21 2030 05/23/21 0448 05/26/21 0454 05/27/21 0423  WBC 17.4*   < > 11.8* 13.9*  NEUTROABS 15.1*  --   --   --   HGB 16.1*   < > 14.6 15.2*  HCT 48.6*   < > 43.8 46.0  MCV 88.2   < > 87.1 88.5  PLT 391   < > 435* 476*   < > = values in this interval not displayed.    Basic Metabolic Panel:   Lab Results  Component Value Date   NA 134 (L) 05/27/2021   K 4.2 05/27/2021   CO2 13 (L) 05/27/2021   GLUCOSE 140 (H) 05/27/2021   BUN 40 (H) 05/27/2021   CREATININE 2.14 (H) 05/27/2021   CALCIUM 10.1 05/27/2021   GFRNONAA 22 (L) 05/27/2021   GFRAA 55 (L) 06/12/2020   Lipid Panel:  Lab Results  Component Value Date   LDLCALC 51 10/18/2020   HgbA1c:  Lab Results  Component Value Date   HGBA1C 6.2 (H) 02/20/2021   Urine Drug Screen: No results found for: LABOPIA, COCAINSCRNUR, LABBENZ, AMPHETMU, THCU, LABBARB  Alcohol Level No results found for: [facility]  CT Head without contrast: Personally reviewed and CT head was negative for a acute ICH or a large territory stroke.  MRI Brain: Personally reviewed and notable for a punctate right posterior frontal lobe white matter infarct and an additional questionable infarct in the corpus callosum.  Also notable for microhemorrhages in bilateral basal ganglia brainstem and cerebellum consistent with her history of chronic hypertension and notable for chronic lacunar infarcts in the right thalamus and bilateral cerebellar hemispheres.  rEEG:  Pending.  Other labs reviewed include following: Vitamin B12 levels obtained on 04/23/2021: Levels were greater than 2000. Reviewed recent TSH obtained on 04/01/2021: Levels are normal.   Impression   Jacoria Keiffer is a 86 y.o. female with PMH significant for esophagitis, chronic kidney disease stage IIIa, essential hypertension, colitis, hypertrophic cardiomyopathy, type 2 diabetes mellitus, and hypertrophic cardiomyopathy who [facility]19 for evaluation of one day of nausea, vomiting, and weakness with impaired ambulation from baseline while using her walker and reports of several weeks of confusion. Here treated as presumed Sepsis of unclear cause with Vanc + cefepime and taken off after Cultures negative. Over the last 24 hours, has stopped talking, worsening encephalopathy and we were  asked to assess her. Her neurologic examination is notable for some concern for left worse than right weakness (could be explained by the noted chronic R thalamic stroke) along with lethargy, confusion. She does not have any obvious meningismus on exam but has had a couple days of broad spectrum antibiotics which can cloud the picture.  Differential includes Cefepime neurotoxicity specially in the setting of AKI with her creatinine now trending up to 2.14 today, potential underlying vitamin deficiency, meningitis/encephalitis, seizures with post ictal confusion.  Recommendations  - MRI C spine to evaluate for any potential spincal cord lesion that could cause LUE and LLE weakness. - routine EEG - Recommend LP with CSF cell count + diff x 2, protein, glucose, HSV 1/2 PCR, cryptococcus PCR, CSF gram stain and cultures.  Serum folate, thiamine along with thiamine 151m daily IV or PO after levels are drawn. - Would recommend holding off on Cefepime. - Okay to hold off on Antibiotics for now but if she develops fever or other signs of sepsis or has worsening encephalopathy, would recommend starting Vanc, Ceftriaxone(meningitis dosing), Ampicillin. - Low suspicion for HSV given negative MRI Brain. ______________________________________________________________________  Plan discussed with Dr. PLouanne Belton   Update: 5:04 PM Will also get standard stroke workup for the noted punctate infarct. Althou, I do not think that the punctate acute strokes would explain her encephalopathy. - MRA Head w/o C along with Vasc UKoreacarotid duplex - Lipid panel and HbA1c - No need to get a repeat TTE, most recently was done 2 months ago with an EF of 65-70%, and no atrial level shunt on color flow doppler.  Dr. PLouanne Beltonupdated.  Thank you for the opportunity to take part in the care of this patient. If you have any further questions, please contact the neurology consultation attending.  Signed,  SStevinsonPager Number [phone]_ _ _   _ __   _ __ _ _  __ __   _ __   __ _

## 2021-05-27 NOTE — TOC Progression Note (Signed)
Transition of Care [facility]) - Progression Note    Patient Details  Name: Anna Hoffman MRN: [id number] Date of Birth: 10/14/1934  [facility] CM/SW Contact  Ross Ludwig, [place] Phone Number: 05/27/2021, 2:10 PM  Clinical Narrative:     CSW spoke to patient's son and he confirmed he would like patient to go to [facility] once medically ready for discharge.  Patient will need insurance authorization before she can go.  CSW to start auth once she is closer to being medically ready for discharge.   Expected Discharge Plan:  (TBD) Barriers to Discharge: Continued Medical Work up  Expected Discharge Plan and Services Expected Discharge Plan:  (TBD)   Discharge Planning Services: CM Consult Post Acute Care Choice: [place] Living arrangements for the past 2 months: Single Family Home                                       Social Determinants of Health (SDOH) Interventions    Readmission Risk Interventions No flowsheet data found.

## 2021-05-27 NOTE — Progress Notes (Addendum)
Progress Note  Patient Name: Anna Hoffman Date of Encounter: 05/27/2021  [facility] Cardiologist: Donato Heinz, MD / EP - Lovena Le  Subjective   Per notes, worsening lethargy beginning yesterday, remaining nonverbal this AM with decreased UOP. Clinical picture shows tachycardia (c/w prior tracings, ? Atrial tach vs atrial flutter) and elevated BP. Patient awakens to sound of voice and looks towards speaker but does not follow commands.  Inpatient Medications    Scheduled Meds:  amLODipine  10 mg Oral Daily   diclofenac Sodium  2 g Topical QID   enoxaparin (LOVENOX) injection  30 mg Subcutaneous Q24H   feeding supplement (KATE FARMS STANDARD 1.4)  325 mL Oral BID BM   hydrALAZINE  100 mg Oral Q8H   insulin aspart  0-9 Units Subcutaneous TID WC   multivitamin with minerals  1 tablet Oral Daily   mupirocin ointment  1 application Nasal BID   pantoprazole (PROTONIX) IV  40 mg Intravenous Q12H   propranolol  10 mg Oral BID   rosuvastatin  20 mg Oral Daily   sodium chloride flush  3 mL Intravenous Q12H   Continuous Infusions:  PRN Meds: acetaminophen **OR** acetaminophen, hydrALAZINE, Muscle Rub, ondansetron (ZOFRAN) IV, polyethylene glycol, polyvinyl alcohol   Vital Signs    Vitals:   05/27/21 0327 05/27/21 0644 05/27/21 0749 05/27/21 0749  BP: (!) 159/102 (!) 144/98 (!) 170/97   Pulse: (!) 122 (!) 101 (!) 139 (!) 137  Resp: 17  20   Temp: 98 F (36.7 C)   98.7 F (37.1 C)  TempSrc: Oral     SpO2: 96%  96% 96%  Weight:      Height:        Intake/Output Summary (Last 24 hours) at 05/27/2021 0924 Last data filed at 05/26/2021 1200 Gross per 24 hour  Intake 0 ml  Output --  Net 0 ml   Last 3 Weights 05/23/2021 05/16/2021 05/10/2021  Weight (lbs) 135 lb 5.8 oz 138 lb 138 lb  Weight (kg) 61.4 kg 62.596 kg 62.596 kg      Telemetry    SVT/atrial tach vs atrial flutter - Personally Reviewed  ECG    SVT/atrial tach vs atrial flutter- will review  with MD - Personally Reviewed  Physical Exam   GEN: No acute distress. Lethargic but easily arousable then stares with fixed gaze at speaker  Neck: No JVD Cardiac: Tachycardic, regular,  no murmurs, rubs, or gallops.  Respiratory: Clear to auscultation bilaterally. GI: Soft, nontender, non-distended  MS: No edema; No deformity. Neuro:  Does not follow commands except possible wiggle of right foot, non verbal  Psych: Unable to assess aside from no agitation  Labs    High Sensitivity Troponin:   Recent Labs  Lab 05/09/21 1825  TROPONINIHS 4     Chemistry Recent Labs  Lab 05/22/21 2030 05/22/21 2042 05/23/21 0448 05/24/21 0426 05/25/21 0704 05/26/21 0454 05/27/21 0423  NA 137  --  132*   < > 133* 130* 134*  K 4.4  --  3.9   < > 4.0 3.8 4.2  CL 109  --  108   < > 105 103 104  CO2 13*  --  13*   < > 18* 16* 13*  GLUCOSE 150*  --  121*   < > 104* 120* 140*  BUN 38*  --  33*   < > 25* 26* 40*  CREATININE 1.86*  --  1.70*   < > 1.52* 1.55* 2.14*  CALCIUM 10.5*  --  9.1   < > 9.5 9.8 10.1  MG  --    < >  --    < > 2.1 2.2 2.7*  PROT 8.8*  --  6.9  --   --   --   --   ALBUMIN 5.0  --  3.8  --   --   --   --   AST 19  --  17  --   --   --   --   ALT 17  --  13  --   --   --   --   ALKPHOS 72  --  54  --   --   --   --   BILITOT 0.8  --  0.8  --   --   --   --   GFRNONAA 26*  --  29*   < > 34* 33* 22*  ANIONGAP 15  --  11   < > 10 11 17*   < > = values in this interval not displayed.    Lipids No results for input(s): CHOL, TRIG, HDL, LABVLDL, LDLCALC, CHOLHDL in the last 168 hours.  Hematology Recent Labs  Lab 05/25/21 0704 05/26/21 0454 05/27/21 0423  WBC 10.1 11.8* 13.9*  RBC 4.66 5.03 5.20*  HGB 13.7 14.6 15.2*  HCT 41.4 43.8 46.0  MCV 88.8 87.1 88.5  MCH 29.4 29.0 29.2  MCHC 33.1 33.3 33.0  RDW 15.6* 15.4 15.9*  PLT 397 435* 476*   Thyroid No results for input(s): TSH, FREET4 in the last 168 hours.  BNPNo results for input(s): BNP, PROBNP in the last 168  hours.  DDimer No results for input(s): DDIMER in the last 168 hours.   Radiology    CT HEAD WO CONTRAST (5MM)  Result Date: 05/26/2021 CLINICAL DATA:  Mental status change of unknown cause. Lethargy. Hypertension. EXAM: CT HEAD WITHOUT CONTRAST TECHNIQUE: Contiguous axial images were obtained from the base of the skull through the vertex without intravenous contrast. COMPARISON:  05/22/2021.  05/09/2021. FINDINGS: Brain: Old small vessel infarction in the left cerebellum. No focal brainstem finding, other than indentation due to tortuous and ectatic vertebrobasilar vessels. Cerebral hemispheres show old infarction in the right thalamus and extensive chronic small-vessel ischemic changes throughout the cerebral hemispheric white matter. No cortical or large vessel territory infarction. No mass lesion, hemorrhage, hydrocephalus or extra-axial collection. Vascular: There is atherosclerotic calcification of the major vessels at the base of the brain. Skull: Negative Sinuses/Orbits: Chronic inflammatory changes of the left division of the sphenoid sinus. Other sinuses clear. Orbits negative. Other: None IMPRESSION: No acute CT finding. Extensive chronic small-vessel ischemic changes throughout the brain as outlined above. Chronic inflammatory changes of the left division of the sphenoid sinus. Electronically Signed   By: Nelson Chimes M.D.   On: 05/26/2021 17:44    Cardiac Studies   2D echo 04/01/21   1. Left ventricular ejection fraction, by estimation, is 65 to 70%. The  left ventricle has hyperdynamic function. The left ventricle has no  regional wall motion abnormalities. There is severe focal basal septal  left ventricular hypertrophy, no LV  outflow tract gradient. Left ventricular diastolic parameters are  consistent with Grade I diastolic dysfunction (impaired relaxation). The  average left ventricular global longitudinal strain is -20.7 %. The global  longitudinal strain is normal.   2.  Right ventricular systolic function is normal. The right ventricular  size is normal. There is normal pulmonary  artery systolic pressure. The  estimated right ventricular systolic pressure is 50.5 mmHg.   3. Left atrial size was mildly dilated.   4. The mitral valve is normal in structure. Trivial mitral valve  regurgitation. No evidence of mitral stenosis.   5. The aortic valve is tricuspid. Aortic valve regurgitation is mild.  Mild aortic valve sclerosis is present, with no evidence of aortic valve  stenosis.   6. Aortic dilatation noted. There is mild dilatation of the ascending  aorta, measuring 41 mm.   7. The inferior vena cava is normal in size with greater than 50%  respiratory variability, suggesting right atrial pressure of 3 mmHg.   Patient Profile     86 y.o. female with hypertrophic cardiomyopathy, PSVT, prior 2:1 heart block, syncope, esophagitis, CKD stage 3a, HTN, ascending aortic dilation, HTN, prior intracerebral hemorrhage 2017 and CVA, prolonged QT, diabetes. She was hospitalized 03/2021 with syncope. 2D echo and cMRI were c/w hypertrophic cardiomyopathy. During admission she had episodes of SVT and 2:1 AV block. Also mention of Mobitz 1 and 2 AVB in chart. F/u OP monitor showed 1 run NSVT (4 beats), type 1 2nd degree AVB, rare ectopy. She had recurrent syncope after turning in the monitor. Dr. Lovena Le recommended PPM insertion and the patient wished to wait. He recommended to wean off her beta blocker. More recently notes indicate a plan to move forward with PPM implantation. She had recent right shoulder injury.   She presented with nausea, vomiting, weakness, and confusion with SIRS criteria of unclear etiology. Over the course of several days, lab workup has demonstrated AKI on CKD, hyponatremia, leukocytosis. Nurse notes indicate progressive lethargy beginning yesterday.   Assessment & Plan    1. SIRS - unclear etiology but clinical status appears to be worsening with  decreased mentation - per primary team  2. 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 Tachy-brady syndrome - followed by EP as outpatient with prior documented Mobitz 2 heart block and plans for outpatient pacemaker - not presently good candidate for this as inpatient given unclear etiology of AMS and SIRS, possible infection issues - prior tachycardia felt to represent SVT - started on low dose propranolol this admission by Dr. Johnsie Cancel - given her issues with heart block, anticipate conservative management of tachycardia - may be driven by underlying medical issues - add TSH, fT4 to labs in AM - will review EKG this AM with MD - felt preliminarily to represent SVT vs atrial flutter - hold off anticoagulation recs pending brain MRI  4. Hypertrophic cardiomyopathy - beta blocker previously ceased due to bradycardia - Dr. Gardiner Rhyme previously discussed family member screening in prior OV  5. AKI on CKD stage 3a - baseline Cr appears around 1.2, with jump from 1.5 to 2.1 today correlating with worsening lethargy - avoid nephrotoxic agents - per primary team  6. Essential HTN - current rx: amlodipine 10mg  daily (increased this AM), hydralazine 100mg  q8hr, propranolol 10mg  BID - follow with newly increased amlodipine although this may be a challenge with mental status - may need to convert to IV forms - avoid ACEI/ARB/Spiro/thiazide diuretics given AKI, clonidine less ideal with bradycardia  issues  For questions or updates, please contact [place] Please consult [email or web address] for contact info under        Signed, Charlie Pitter,  PA-C  05/27/2021, 9:24 AM    I have seen and examined the patient along with Charlie Pitter, PA-C .  I have reviewed the chart, notes and new data.  I agree with PA/NP's note.  Key new complaints: not interactive. History from chart Key examination changes: normal CV exam, not following commands Key new findings / data: MRI shows mild-to-moderate atrophy and 2 subacute ischemic infarcts (post R frontal and R corpus callosus just to R). ECG w SVT suggests ectopic atrial tachycardia versus atrial flutter w 2:1 AV block and a single PVC that has no impact on the SVT timing. ECG in SR a few days ago (Stachy 108 w PACs) w distinct P  waves and long PR 210 ms.  With carotid sinus compression performed today at 1712 h, the rhythm slows down, allows identification of typical sinus P waves and then gradually speeds back up to 130 bpm. The effect of R carotid sinus compression is more pronounced than L (sinus node innervation is predominantly from the R).   ^^^ R CSC    ^^^ L CSC   PLAN:    Has a history of second degree AV block and syncope so we are trying to avoid AV blocking agents. The current tachycardia is just sinus tachycardia, likely a physiological response to extracardiac illness. In part it may be related to beta blocker rebound after her home medication was interrupted (she is not swallowing her meds). Hydralazine may also cause reflex tachycardia. It is not a cause of her encephalopathy, but rather one more symptom of the underlying illness, that we are still working on identifying. No compelling reason to treat the sinus tachycardia pharmacologically. Pacemaker implantation  is not urgent and should be deferred until we clarify the diagnosis, especially if infection is a possibility. For BP, can use labetalol and hydralazine IV or even a nicardipine drip if she continues to be unable to swallow meds. Thankfully no murmur of LV outflow obstruction (and no gradient on recent echo),  but this could become an issue if she becomes hypovolemic (she is not eating or drinking) and receives only vasodilators. If that occurs, can start low dose beta blocker IV (metoprolol 2.5 mg IV Q 6h)  Sanda Klein, MD, [place] [phone] 05/27/2021, 2:05 PM

## 2021-05-27 NOTE — Evaluation (Signed)
Clinical/Bedside Swallow Evaluation Patient Details  Name: Anna Hoffman MRN: [id number] Date of Birth: October 04, 1934  Today's Date: 05/27/2021 Time: SLP Start Time (ACUTE ONLY): 7253 SLP Stop Time (ACUTE ONLY): 1455 SLP Time Calculation (min) (ACUTE ONLY): 20 min  Past Medical History:  Past Medical History:  Diagnosis Date   Arthritis    Diabetes mellitus without complication ([place])    Hypertension    Hypokalemia    Pneumonia    Shingles    Stroke [facility])    Past Surgical History:  Past Surgical History:  Procedure Laterality Date   ABDOMINAL HYSTERECTOMY     BREAST EXCISIONAL BIOPSY Left    ESOPHAGOGASTRODUODENOSCOPY N/A 09/01/2014   Procedure: ESOPHAGOGASTRODUODENOSCOPY (EGD);  Surgeon: Lear Ng, MD;  Location: [facility];  Service: Endoscopy;  Laterality: N/A;   ESOPHAGOGASTRODUODENOSCOPY (EGD) WITH PROPOFOL N/A 10/12/2020   Procedure: ESOPHAGOGASTRODUODENOSCOPY (EGD) WITH PROPOFOL;  Surgeon: Mauri Pole, MD;  Location: [facility];  Service: Endoscopy;  Laterality: N/A;   HIATAL HERNIA REPAIR N/A 09/04/2014   Procedure: LAPAROSCOPIC REPAIR OF HIATAL HERNIA;  Surgeon: Excell Seltzer, MD;  Location: [facility];  Service: General;  Laterality: N/A;  With MESH   IR GENERIC HISTORICAL  04/10/2016   IR US GUIDE VASC ACCESS RIGHT 04/10/2016 Corrie Mckusick, DO [facility]   IR GENERIC HISTORICAL  04/10/2016   IR ANGIOGRAM SELECTIVE EACH ADDITIONAL VESSEL 04/10/2016 Corrie Mckusick, DO [facility]   IR GENERIC HISTORICAL  04/10/2016   IR ANGIOGRAM SELECTIVE EACH ADDITIONAL VESSEL 04/10/2016 Corrie Mckusick, DO [facility]   IR GENERIC HISTORICAL  04/10/2016   IR EMBO TUMOR ORGAN ISCHEMIA INFARCT INC GUIDE ROADMAPPING 04/10/2016 Corrie Mckusick, DO [facility]   IR GENERIC HISTORICAL  04/10/2016   IR ANGIOGRAM SELECTIVE EACH ADDITIONAL VESSEL 04/10/2016 Corrie Mckusick, DO [facility]   IR GENERIC HISTORICAL  04/10/2016   IR ANGIOGRAM SELECTIVE EACH ADDITIONAL VESSEL 04/10/2016 Corrie Mckusick, DO  [facility]   IR GENERIC HISTORICAL  04/10/2016   IR RENAL SELECTIVE  UNI INC S&I MOD SED 04/10/2016 Corrie Mckusick, DO [facility]   IR GENERIC HISTORICAL  03/06/2016   IR RADIOLOGIST EVAL & MGMT 03/06/2016 Corrie Mckusick, DO [facility]   IR GENERIC HISTORICAL  03/26/2016   IR RADIOLOGIST EVAL & MGMT 03/26/2016 Corrie Mckusick, DO [facility]   IR GENERIC HISTORICAL  04/29/2016   IR RADIOLOGIST EVAL & MGMT 04/29/2016 [facility]   IR RADIOLOGIST EVAL & MGMT  11/12/2016   IR RADIOLOGIST EVAL & MGMT  12/16/2017   KNEE SURGERY     TONSILLECTOMY     HPI:  Pt is an 86 yo female adm to [facility] with N/V. Pt has h/o esophagitis, HH, CKD Stage 3A, cardiomyopathy, DM, colitis, HTN, right lobe pna, ICH. CT head Small chronic infarcts in the right  thalamus and left cerebellar hemisphere are unchanged. PT with several admits to hospital in last several months.  Most recent for fall.    Assessment / Plan / Recommendation  Clinical Impression  New BSE order received secondary to patient exhibiting significant decline and now is NPO. Previous BSE on 10/21 during this admission was limited secondary to patient only accepting a small amount of PO's, however recommendation was made for regular solids, thin liquids secondary to no clinical s/s dysphagia. During today's assessment, patient had eyes closed, did not attempt to communicate and was resistant to SLP when performing oral care. She bit at toothette sponge and would not let go, with SLP finally  releasing it when she relaxed her clenched jaw. SLP was able to perform oral suction as patient has some missing teeth on sides, creating gaps to fit the suction tip. Significant amount of secretions removed from oral cavity and questionable PO residuals as well. SLP was able to remove majority of secretions despite patient resisting. No PO's given as patient is not safe to have anything at this time. SLP recommending to continue NPO status and will f/u with patient  for PO readiness with prognosis guarded at this time. SLP Visit Diagnosis: Dysphagia, unspecified (R13.10)    Aspiration Risk  Severe aspiration risk;Risk for inadequate nutrition/hydration    Diet Recommendation NPO   Medication Administration: Via alternative means    Other  Recommendations Oral Care Recommendations: Oral care QID    Recommendations for follow up therapy are one component of a multi-disciplinary discharge planning process, led by the attending physician.  Recommendations may be updated based on patient status, additional functional criteria and insurance authorization.  Follow up Recommendations Other (comment) (TBD pending progress)      Frequency and Duration min 2x/week  1 week       Prognosis Prognosis for Safe Diet Advancement: Guarded Barriers to Reach Goals: Cognitive deficits      Swallow Study   General Date of Onset: 05/24/21 HPI: Pt is an 86 yo female adm to [facility] with N/V. Pt has h/o esophagitis, HH, CKD Stage 3A, cardiomyopathy, DM, colitis, HTN, right lobe pna, ICH. CT head Small chronic infarcts in the right  thalamus and left cerebellar hemisphere are unchanged. PT with several admits to hospital in last several months.  Most recent for fall. Type of Study: Bedside Swallow Evaluation Previous Swallow Assessment: 10/21 BSE during this admission Diet Prior to this Study: NPO Temperature Spikes Noted: No Respiratory Status: Room air History of Recent Intubation: No Behavior/Cognition: Lethargic/Drowsy;Uncooperative;Requires cueing;Doesn't follow directions Oral Cavity Assessment: Excessive secretions Oral Care Completed by SLP: Yes Oral Cavity - Dentition: Adequate natural dentition Self-Feeding Abilities: Total assist Patient Positioning: Upright in bed Baseline Vocal Quality: Not observed Volitional Cough: Cognitively unable to elicit Volitional Swallow: Unable to elicit    Oral/Motor/Sensory Function Overall Oral Motor/Sensory Function:  Other (comment) (patient did not allow for oral motor exam but no significant facial or labial weakness or droop observed)   Ice Chips     Thin Liquid Thin Liquid: Not tested    Nectar Thick Nectar Thick Liquid: Not tested   Honey Thick Honey Thick Liquid: Not tested   Puree Puree: Not tested   Solid     Solid: Not tested      Sonia Baller, MA, CCC-SLP Speech Therapy

## 2021-05-28 ENCOUNTER — Inpatient Hospital Stay (HOSPITAL_COMMUNITY): Payer: Medicare Other

## 2021-05-28 ENCOUNTER — Ambulatory Visit: Payer: Self-pay

## 2021-05-28 DIAGNOSIS — E440 Moderate protein-calorie malnutrition: Secondary | ICD-10-CM

## 2021-05-28 DIAGNOSIS — N1831 Chronic kidney disease, stage 3a: Secondary | ICD-10-CM

## 2021-05-28 DIAGNOSIS — I639 Cerebral infarction, unspecified: Secondary | ICD-10-CM | POA: Diagnosis not present

## 2021-05-28 DIAGNOSIS — N179 Acute kidney failure, unspecified: Secondary | ICD-10-CM | POA: Diagnosis not present

## 2021-05-28 DIAGNOSIS — I10 Essential (primary) hypertension: Secondary | ICD-10-CM | POA: Diagnosis not present

## 2021-05-28 DIAGNOSIS — N183 Chronic kidney disease, stage 3 unspecified: Secondary | ICD-10-CM

## 2021-05-28 DIAGNOSIS — R4182 Altered mental status, unspecified: Secondary | ICD-10-CM | POA: Diagnosis not present

## 2021-05-28 DIAGNOSIS — E860 Dehydration: Secondary | ICD-10-CM

## 2021-05-28 DIAGNOSIS — E782 Mixed hyperlipidemia: Secondary | ICD-10-CM

## 2021-05-28 DIAGNOSIS — E1122 Type 2 diabetes mellitus with diabetic chronic kidney disease: Secondary | ICD-10-CM

## 2021-05-28 DIAGNOSIS — R9431 Abnormal electrocardiogram [ECG] [EKG]: Secondary | ICD-10-CM

## 2021-05-28 LAB — URINALYSIS, ROUTINE W REFLEX MICROSCOPIC
Bacteria, UA: NONE SEEN
Bilirubin Urine: NEGATIVE
Glucose, UA: NEGATIVE mg/dL
Ketones, ur: 5 mg/dL — AB
Leukocytes,Ua: NEGATIVE
Nitrite: NEGATIVE
Protein, ur: 100 mg/dL — AB
Specific Gravity, Urine: 1.015 (ref 1.005–1.030)
pH: 5 (ref 5.0–8.0)

## 2021-05-28 LAB — CBC
HCT: 43.7 % (ref 36.0–46.0)
Hemoglobin: 14.3 g/dL (ref 12.0–15.0)
MCH: 29.2 pg (ref 26.0–34.0)
MCHC: 32.7 g/dL (ref 30.0–36.0)
MCV: 89.2 fL (ref 80.0–100.0)
Platelets: 429 10*3/uL — ABNORMAL HIGH (ref 150–400)
RBC: 4.9 MIL/uL (ref 3.87–5.11)
RDW: 16 % — ABNORMAL HIGH (ref 11.5–15.5)
WBC: 19.3 10*3/uL — ABNORMAL HIGH (ref 4.0–10.5)
nRBC: 0 % (ref 0.0–0.2)

## 2021-05-28 LAB — GLUCOSE, CAPILLARY
Glucose-Capillary: 142 mg/dL — ABNORMAL HIGH (ref 70–99)
Glucose-Capillary: 125 mg/dL — ABNORMAL HIGH (ref 70–99)
Glucose-Capillary: 136 mg/dL — ABNORMAL HIGH (ref 70–99)
Glucose-Capillary: 128 mg/dL — ABNORMAL HIGH (ref 70–99)

## 2021-05-28 LAB — CULTURE, BLOOD (ROUTINE X 2)
Culture: NO GROWTH
Culture: NO GROWTH

## 2021-05-28 LAB — HEMOGLOBIN A1C
Hgb A1c MFr Bld: 6.2 % — ABNORMAL HIGH (ref 4.8–5.6)
Mean Plasma Glucose: 131.24 mg/dL

## 2021-05-28 LAB — TSH: TSH: 1.081 u[IU]/mL (ref 0.350–4.500)

## 2021-05-28 LAB — BASIC METABOLIC PANEL
Anion gap: 16 — ABNORMAL HIGH (ref 5–15)
BUN: 55 mg/dL — ABNORMAL HIGH (ref 8–23)
CO2: 14 mmol/L — ABNORMAL LOW (ref 22–32)
Calcium: 9.8 mg/dL (ref 8.9–10.3)
Chloride: 107 mmol/L (ref 98–111)
Creatinine, Ser: 2.61 mg/dL — ABNORMAL HIGH (ref 0.44–1.00)
GFR, Estimated: 18 mL/min — ABNORMAL LOW (ref >60)
Glucose, Bld: 140 mg/dL — ABNORMAL HIGH (ref 70–99)
Potassium: 3.9 mmol/L (ref 3.5–5.1)
Sodium: 137 mmol/L (ref 135–145)

## 2021-05-28 LAB — T4, FREE: Free T4: 1.42 ng/dL — ABNORMAL HIGH (ref 0.61–1.12)

## 2021-05-28 MED ORDER — CHLORHEXIDINE GLUCONATE CLOTH 2 % EX PADS
6.0000 | MEDICATED_PAD | Freq: Every day | CUTANEOUS | Status: DC
  Administered 2021-05-28 – 2021-06-06 (×9): 6 via TOPICAL

## 2021-05-28 MED ORDER — METOPROLOL TARTRATE 5 MG/5ML IV SOLN
5.0000 mg | Freq: Three times a day (TID) | INTRAVENOUS | Status: DC
  Administered 2021-05-28 – 2021-05-30 (×6): 5 mg via INTRAVENOUS
  Filled 2021-05-28 (×6): qty 5

## 2021-05-28 NOTE — Progress Notes (Signed)
CCMD alerted RN to Pt having 7 beat run of Vtach. When vitals checked, BP 170/108, PRN was given. Pt HR 129, pt has been tachy since 10/23.

## 2021-05-28 NOTE — Progress Notes (Signed)
PROGRESS NOTE  Jalayla Chrismer Camberos GXQ:[id number] DOB: Aug 22, 1934 DOA: 05/22/2021 PCP: Glendale Chard, MD   LOS: 5 days   Brief narrative:  Sawyer Kahan is a 86 y.o. female with medical history significant of esophagitis, CKD stage IIIa, hypertension, colitis, hypertrophic cardiomyopathy and intracerebral hemorrhage, diabetes presented to hospital with nausea vomiting and weakness for 2 to 3 days with difficulty ambulating and confusion for few weeks.  She also had a fall with some trauma to the right shoulder.  In the ED, patient was mildly tachycardic and hypertensive.  Creatinine was elevated at 1.82 from baseline 1.2.  There was mild leukocytosis but hemoglobin was 16.1.  X-ray of chest, CT head scan and pelvic scan were negative for acute findings except for left renal angiomyolipoma.  Patient received vancomycin cefepime and Flagyl in the ED and was admitted to hospital after IV fluid bolus.    At this time, cultures have been negative and  antibiotics have been discontinued after completion of 3-day course of cefepime.  Patient continues to be weak and debilitated and PT has recommended skilled nursing facility placement.  Cardiology has seen the patient  for her history of intermittent heart block and question hypertrophic cardiomyopathy and has been adjusted on medications. On  05/26/2021, patient had accelerated hypertension and was less responsive.  Neurology was consulted.  Concern for stroke and cefepime induced neurotoxicity.  MRI showed minute stroke.  Assessment/Plan:  Principal Problem:   SIRS (systemic inflammatory response syndrome) ([name]) Active Problems:   Benign essential HTN   Type 2 diabetes mellitus with stage 3 chronic kidney disease, without long-term current use of insulin ([name])   Mixed hyperlipidemia   Acute renal failure superimposed on stage 3a chronic kidney disease ([name])   Prolonged QT interval   Dehydration   HOCM (hypertrophic obstructive cardiomyopathy)  ([place])   History of CVA (cerebrovascular accident)   Sepsis ([place])   Malnutrition of moderate degree  SIRS. SIRS criteria on presentation with tachycardia, elevated lactate leukocytosis.  CT scan of the abdomen pelvis without any acute findings.   MRSA PCR negative.  COVID influenza negative.    Blood cultures and urine cultures negative so far.  Was on cefepime initially which has been discontinued.  Altered mental status possible acute metabolic encephalopathy/stroke.    ABG showed no hypercarbia.  CT head scan showed microvascular disease.  MRI of the brain showed 2 mm stroke in the posterior right frontal lobe and 3 mm stroke in the callosal splenium.  Neurology on board.  Initial thought was possible cefepime induced neurotoxicity.  Check ultrasound of the kidney  Mild oliguric AKI on CKD 3 Baseline creatinine of around 1.2.  Creatinine was 1.6 on presentation.  Creatinine of 2.6 today.  Could be secondary to accelerated hypertension, antibiotic induced.  We will get urine analysis.  Closely monitor urine output.  Intake and output charting.  Will place Foley catheter.  Check ultrasound of the kidneys.  She has been started on IV fluids.  If renal failure, continues to deteriorate would consider nephrology evaluation.  Hypertrophic cardiomyopathy with history of Intermittent second degree heart block, tachybradycardia syndrome. Currently on hydralazine and propanolol.   Last known 2D echocardiogram with EF 14-48% with G1 diastolic dysfunction and focal findings.  Patient does have history of syncope secondary to intermittent secondary heart block and has plans for pacemaker placement in the future.  Cardiology is involved at this time.  Essential hypertension with accelerated hypertension. On Hydralazine and propranolol.  Not able to  use  ACE inhibitor's ARB's, overall blood pressure has improved at this time.  Hopefully able to take p.o.  Diabetes mellitus type II Continue sliding scale  insulin Accu-Cheks diabetic diet.  Hypokalemia.  Improved after replacement.  Latest potassium level of 3.9   Hyperlipidemia Continue Crestor  Recent fall, debility weakness.  PT and OT has seen the patient and recommended skilled nursing facility placement on discharge..  Right shoulder pain from recent fall and history of rotator cuff tear.  Continue K pad local analgesic cream Tylenol.  X-ray of the right shoulder without any fracture.  Continue physical therapy.  Moderate protein calorie malnutrition.  Present on admission.  Dietary on board.  Continue nutritional supplements, will get a speech therapy evaluation again since patient is still n.p.o.  Disposition.  Occupational Therapy recommended skilled nursing facility placement on discharge.  PT evaluation pending.  DVT prophylaxis:   Code Status: Full code  Family Communication:  Spoke with the patient's son on the phone and updated him about the clinical condition of the patient  Status is: Inpatient  The patient is inpatient because: Altered mental status, acute kidney injury,, need for skilled nursing facility placement.   Consultants: Cardiology Neurology  Procedures: None  Anti-infectives:  Off dc cefepime  Subjective: Today,  patient was seen and examined at bedside.  Patient is much more alert and awake today and nursing staff just now just now reported that patient was alert awake and communicative oriented to place and person.  Objective: Vitals:   05/28/21 1152 05/28/21 1347  BP: (!) 143/92 (!) 160/94  Pulse: (!) 140 (!) 138  Resp: 18 20  Temp: 98.7 F (37.1 C) 98.6 F (37 C)  SpO2: 95% 96%    Intake/Output Summary (Last 24 hours) at 05/28/2021 1415 Last data filed at 05/28/2021 0408 Gross per 24 hour  Intake 647.34 ml  Output 600 ml  Net 47.34 ml    Filed Weights   05/23/21 0237  Weight: 61.4 kg   Body mass index is 22.53 kg/m.   Physical Exam: General:  Average built, not in  obvious distress, alert awake at the time of my evaluation but was not quite communicative. HENT:   No scleral pallor or icterus noted. Oral mucosa is moist.  Chest:   Diminished breath sounds bilaterally. No crackles or wheezes.  CVS: S1 &S2 heard. No murmur.  Regular rate and rhythm. Abdomen: Soft, nontender, nondistended.  Bowel sounds are heard.   Extremities: No cyanosis, clubbing or edema.  Peripheral pulses are palpable.    Psych: Alert awake. CNS: Awake.  Movement more on the right side than the left.   Skin: Warm and dry.  No rashes noted.  Data Review: I have personally reviewed the following laboratory data and studies,  CBC: Recent Labs  Lab 05/22/21 2030 05/23/21 0448 05/24/21 0426 05/25/21 0704 05/26/21 0454 05/27/21 0423 05/28/21 0456  WBC 17.4*   < > 10.6* 10.1 11.8* 13.9* 19.3*  NEUTROABS 15.1*  --   --   --   --   --   --   HGB 16.1*   < > 12.5 13.7 14.6 15.2* 14.3  HCT 48.6*   < > 37.4 41.4 43.8 46.0 43.7  MCV 88.2   < > 87.8 88.8 87.1 88.5 89.2  PLT 391   < > 345 397 435* 476* 429*   < > = values in this interval not displayed.    Basic Metabolic Panel: Recent Labs  Lab 05/22/21 2042 05/23/21  3244 05/24/21 0426 05/25/21 0704 05/26/21 0454 05/27/21 0423 05/28/21 0456  NA  --    < > 132* 133* 130* 134* 137  K  --    < > 3.2* 4.0 3.8 4.2 3.9  CL  --    < > 106 105 103 104 107  CO2  --    < > 15* 18* 16* 13* 14*  GLUCOSE  --    < > 98 104* 120* 140* 140*  BUN  --    < > 29* 25* 26* 40* 55*  CREATININE  --    < > 1.57* 1.52* 1.55* 2.14* 2.61*  CALCIUM  --    < > 9.1 9.5 9.8 10.1 9.8  MG 2.5*  --  2.0 2.1 2.2 2.7*  --    < > = values in this interval not displayed.    Liver Function Tests: Recent Labs  Lab 05/22/21 2030 05/23/21 0448  AST 19 17  ALT 17 13  ALKPHOS 72 54  BILITOT 0.8 0.8  PROT 8.8* 6.9  ALBUMIN 5.0 3.8    Recent Labs  Lab 05/22/21 2030  LIPASE 26    Recent Labs  Lab 05/27/21 1445  AMMONIA 24   Cardiac  Enzymes: No results for input(s): CKTOTAL, CKMB, CKMBINDEX, TROPONINI in the last 168 hours. BNP (last 3 results) No results for input(s): BNP in the last 8760 hours.  ProBNP (last 3 results) No results for input(s): PROBNP in the last 8760 hours.  CBG: Recent Labs  Lab 05/27/21 0851 05/27/21 1133 05/27/21 1635 05/28/21 0756 05/28/21 1154  GLUCAP 177* 154* 112* 142* 125*    Recent Results (from the past 240 hour(s))  Culture, Urine     Status: None   Collection Time: 05/22/21 12:18 PM   Specimen: Urine   UR  Result Value Ref Range Status   Urine Culture, Routine Final report  Final   Organism ID, Bacteria Comment  Final    Comment: Culture shows less than 10,000 colony forming units of bacteria per milliliter of urine. This colony count is not generally considered to be clinically significant.   Blood Culture (routine x 2)     Status: None   Collection Time: 05/22/21  9:58 PM   Specimen: Right Antecubital; Blood  Result Value Ref Range Status   Specimen Description   Final    RIGHT ANTECUBITAL Performed at [facility] [address] [place], [place] [address]    Special Requests   Final    BOTTLES DRAWN AEROBIC AND ANAEROBIC Blood Culture results may not be optimal due to an inadequate volume of blood received in culture bottles Performed at [facility] [address] [place], [place] [address]    Culture   Final    NO GROWTH 5 DAYS Performed at [facility], [place] [address], [place], [place] [address]    Report Status 05/28/2021 FINAL  Final  Blood Culture (routine x 2)     Status: None   Collection Time: 05/22/21  9:59 PM   Specimen: BLOOD LEFT HAND  Result Value Ref Range Status   Specimen Description   Final    BLOOD LEFT HAND Performed at [facility] [address] [place], [place] [address]    Special Requests   Final    BOTTLES DRAWN AEROBIC ONLY Blood Culture results may not be  optimal due to an inadequate volume of blood received in culture bottles Performed at [facility]  [place] [address] [place], [place] [address]    Culture   Final    NO GROWTH 5 DAYS Performed at [facility], [place] [address] [place], [place] [address]    Report Status 05/28/2021 FINAL  Final  Urine Culture     Status: None   Collection Time: 05/23/21 12:30 AM   Specimen: In/Out Cath Urine  Result Value Ref Range Status   Specimen Description   Final    IN/OUT CATH URINE Performed at [facility] [address] [place], [place] [address]    Special Requests   Final    NONE Performed at [facility], [place] [address] [place], [place] [address]    Culture   Final    NO GROWTH Performed at [facility], [place] [address], [place], [place] [address]    Report Status 05/24/2021 FINAL  Final  Surgical pcr screen     Status: Abnormal   Collection Time: 05/23/21 12:49 AM   Specimen: Nasal Mucosa; Nasal Swab  Result Value Ref Range Status   MRSA, PCR NEGATIVE NEGATIVE Final   Staphylococcus aureus POSITIVE (A) NEGATIVE Final    Comment: (NOTE) The Xpert SA Assay (FDA approved for NASAL specimens in patients 37 years of age and older), is one component of a comprehensive surveillance program. It is not intended to diagnose infection nor to guide or monitor treatment. Performed at [facility], [place] [address] [place], [place] [address]   Resp Panel by RT-PCR (Flu A&B, Covid) Nasal Mucosa     Status: None   Collection Time: 05/23/21 12:49 AM   Specimen: Nasal Mucosa; Nasopharyngeal(NP) swabs in vial transport medium  Result Value Ref Range Status   SARS Coronavirus 2 by RT PCR NEGATIVE NEGATIVE Final    Comment: (NOTE) SARS-CoV-2 target nucleic acids are NOT DETECTED.  The SARS-CoV-2 RNA is generally detectable in upper respiratory specimens during the acute phase of infection. The  lowest concentration of SARS-CoV-2 viral copies this assay can detect is 138 copies/mL. A negative result does not preclude SARS-Cov-2 infection and should not be used as the sole basis for treatment or other patient management decisions. A negative result may occur with  improper specimen collection/handling, submission of specimen other than nasopharyngeal swab, presence of viral mutation(s) within the areas targeted by this assay, and inadequate number of viral copies(<138 copies/mL). A negative result must be combined with clinical observations, patient history, and epidemiological information. The expected result is Negative.  Fact Sheet for Patients:  EntrepreneurPulse.com.au  Fact Sheet for Healthcare Providers:  IncredibleEmployment.be  This test is no t yet approved or cleared by the [place] FDA and  has been authorized for detection and/or diagnosis of SARS-CoV-2 by FDA under an Emergency Use Authorization (EUA). This EUA will remain  in effect (meaning this test can be used) for the duration of the COVID-19 declaration under Section 564(b)(1) of the Act, 21 U.S.C.section 360bbb-3(b)(1), unless the authorization is terminated  or revoked sooner.       Influenza A by PCR NEGATIVE NEGATIVE Final   Influenza B by PCR NEGATIVE NEGATIVE Final    Comment: (NOTE) The Xpert Xpress SARS-CoV-2/FLU/RSV plus assay is intended as an aid in the diagnosis of influenza from Nasopharyngeal swab specimens and should not be used as a sole basis for treatment. Nasal washings and aspirates are unacceptable for Xpert Xpress SARS-CoV-2/FLU/RSV testing.  Fact Sheet for Patients: EntrepreneurPulse.com.au  Fact Sheet for  Healthcare Providers: IncredibleEmployment.be  This test is not yet approved or cleared by the [place] and has been authorized for detection and/or diagnosis of SARS-CoV-2 by FDA under  an Emergency Use Authorization (EUA). This EUA will remain in effect (meaning this test can be used) for the duration of the COVID-19 declaration under Section 564(b)(1) of the Act, 21 U.S.C. section 360bbb-3(b)(1), unless the authorization is terminated or revoked.  Performed at [facility], [place] [address] [place], [place] [address]       Studies: CT HEAD WO CONTRAST (5MM)  Result Date: 05/26/2021 CLINICAL DATA:  Mental status change of unknown cause. Lethargy. Hypertension. EXAM: CT HEAD WITHOUT CONTRAST TECHNIQUE: Contiguous axial images were obtained from the base of the skull through the vertex without intravenous contrast. COMPARISON:  05/22/2021.  05/09/2021. FINDINGS: Brain: Old small vessel infarction in the left cerebellum. No focal brainstem finding, other than indentation due to tortuous and ectatic vertebrobasilar vessels. Cerebral hemispheres show old infarction in the right thalamus and extensive chronic small-vessel ischemic changes throughout the cerebral hemispheric white matter. No cortical or large vessel territory infarction. No mass lesion, hemorrhage, hydrocephalus or extra-axial collection. Vascular: There is atherosclerotic calcification of the major vessels at the base of the brain. Skull: Negative Sinuses/Orbits: Chronic inflammatory changes of the left division of the sphenoid sinus. Other sinuses clear. Orbits negative. Other: None IMPRESSION: No acute CT finding. Extensive chronic small-vessel ischemic changes throughout the brain as outlined above. Chronic inflammatory changes of the left division of the sphenoid sinus. Electronically Signed   By: Nelson Chimes M.D.   On: 05/26/2021 17:44   MR ANGIO HEAD WO CONTRAST  Result Date: 05/27/2021 CLINICAL DATA:  Initial evaluation for neuro deficit, stroke suspected. EXAM: MRA HEAD WITHOUT CONTRAST TECHNIQUE: Angiographic images of the Circle of Willis were acquired using MRA technique without  intravenous contrast. COMPARISON:  MRI from earlier the same day. FINDINGS: Anterior circulation: Visualized distal cervical segments of the internal carotid arteries are patent with antegrade flow. Petrous and cavernous segments patent bilaterally. Atheromatous change seen involving the supraclinoid segments of both internal carotid arteries bilaterally. Associated up to approximate moderate stenosis, left worse than right. A1 segments patent bilaterally, with the left being slightly hypoplastic. Normal anterior communicating artery complex. Both ACAs patent to their distal aspects without flow-limiting stenosis. M1 segments patent bilaterally. Normal MCA bifurcations. Distal MCA branches demonstrate diffuse small vessel atheromatous irregularity but are well perfused without proximal branch occlusion. Posterior circulation: Visualized vertebral arteries are somewhat dolichoectatic and tortuous without stenosis. Right PICA origin widely patent. Left PICA origin not definitely visualized. Basilar somewhat dolichoectatic as well, most pronounced proximally. Basilar widely patent without stenosis. Superior cerebellar arteries are patent bilaterally. Both PCAs primarily supplied via the basilar. Moderate atheromatous change throughout the right PCA with associated moderate multifocal P2/P3 stenoses. Additional mild-to-moderate atheromatous change noted within the left PCAs well without high-grade stenosis. Anatomic variants: None significant. Other: No intracranial aneurysm. IMPRESSION: 1. Negative intracranial MRA for large vessel occlusion. 2. Moderate intracranial atherosclerotic disease involving the supraclinoid ICAs and PCAs with associated moderate multifocal stenoses as above. 3. Dolichoectatic vertebrobasilar system, suggesting chronic underlying hypertension. Electronically Signed   By: Jeannine Boga M.D.   On: 05/27/2021 23:37   MR BRAIN WO CONTRAST  Result Date: 05/27/2021 CLINICAL DATA:  Mental  status change, unknown cause. Additional history provided: Mental status change, possible sepsis, recent fall. EXAM: MRI HEAD WITHOUT CONTRAST TECHNIQUE: Multiplanar, multiecho pulse sequences of the brain and surrounding  structures were obtained without intravenous contrast. COMPARISON:  Head CT 05/26/2021. FINDINGS: Brain: Mild-to-moderate cerebral atrophy. Comparatively mild cerebellar atrophy. 2 mm acute/early subacute infarct with posterior right frontal lobe white matter (series 5, image 91). 3 mm acute/early subacute infarct within the callosal splenium just to the right of midline (versus artifact) (for instance as seen on series 5, image 83). Advanced multifocal T2 FLAIR hyperintense signal abnormality within the cerebral white matter, nonspecific but compatible with chronic small vessel ischemic disease. Chronic small vessel ischemic changes are also present within the bilateral basal ganglia, thalami and pons. Chronic lacunar infarcts within the right thalamus and bilateral cerebellar hemispheres. Supratentorial and infratentorial chronic microhemorrhages, most notably within the bilateral thalami, within the brainstem and within the bilateral cerebellar hemispheres. These likely reflect sequela of chronic hypertensive microangiopathy. No evidence of an intracranial mass. No extra-axial fluid collection. No midline shift. Vascular: Maintained flow voids within the proximal large arterial vessels. Skull and upper cervical spine: No focal suspicious marrow lesion. Incompletely assessed cervical spondylosis. Mild C3-C4 grade 1 anterolisthesis. Sinuses/Orbits: Visualized orbits show no acute finding. Bilateral lens replacements. Mild mucosal thickening within the right frontoethmoidal recess. Moderate fluid level within the left sphenoid sinus. IMPRESSION: 2 mm acute/early subacute infarct within the posterior right frontal lobe white matter. An additional 3 mm acute/early subacute infarct is questioned  within the callosal splenium (versus artifact). Advanced chronic small vessel ischemic disease within the cerebral white matter. Chronic small vessel ischemic changes are also present within the bilateral basal ganglia, thalami and pons. Chronic lacunar infarcts within the right thalamus and bilateral cerebellar hemispheres. Supratentorial and infratentorial chronic microhemorrhages, most notably within the thalami, brainstem and cerebellar hemispheres. These findings likely reflect sequela of chronic hypertensive microangiopathy. Mild-to-moderate generalized cerebral atrophy. Comparatively mild cerebellar atrophy. Incompletely assessed cervical spondylosis. Mild C3-C4 grade 1 anterolisthesis. Paranasal sinus disease, as described. Electronically Signed   By: Kellie Simmering D.O.   On: 05/27/2021 11:22   MR CERVICAL SPINE WO CONTRAST  Result Date: 05/27/2021 CLINICAL DATA:  Initial evaluation for myelopathy, acute or progressive by epidural abscess. EXAM: MRI CERVICAL SPINE WITHOUT CONTRAST TECHNIQUE: Multiplanar, multisequence MR imaging of the cervical spine was performed. No intravenous contrast was administered. COMPARISON:  CT from 03/31/2021. FINDINGS: Alignment: Straightening of the normal cervical lordosis. Associated stepwise grade 1 anterolisthesis of C3 on C4, C4 on C5, C7 on T1, and T2 on T3, likely chronic and facet mediated. Vertebrae: Vertebral body height maintained without acute or chronic fracture. Bone marrow signal intensity within normal limits. No discrete or worrisome osseous lesions. No findings to suggest osteomyelitis discitis or septic arthritis. Cord: Normal signal and morphology. No cord signal changes to suggest myelopathy. No epidural abscess or other collection. Posterior Fossa, vertebral arteries, paraspinal tissues: Chronic microvascular ischemic changes noted within the visualized pons/brainstem. Craniocervical junction normal. Paraspinous and prevertebral soft tissues within  normal limits. Normal flow voids seen within the vertebral arteries bilaterally. Disc levels: C2-C3: Mild disc bulge. Mild left greater than right facet hypertrophy. No canal or foraminal stenosis. C3-C4: Grade 1 anterolisthesis with intervertebral disc space narrowing and diffuse disc bulge. Right paracentral disc protrusion indents the right ventral thecal sac, contacting the ventral cord (series 9, image 8). Minimal cord flattening without cord signal changes. Superimposed left-sided uncovertebral and facet hypertrophy with resultant moderate left C4 foraminal stenosis. Right neural foramina remains patent. C4-C5: Degenerative intervertebral disc space narrowing with grade 1 anterolisthesis. Broad-based left paracentral disc osteophyte complex indents the ventral thecal sac, contacting and minimally  flattening the ventral cord. No cord signal changes. Mild spinal stenosis. Superimposed mild right-sided facet degeneration. No significant foraminal encroachment. C5-C6: Advanced degenerative intervertebral disc space narrowing with diffuse disc osteophyte complex. Broad posterior component flattens and partially faces the ventral thecal sac with resultant mild spinal stenosis, asymmetric to the left. Mild cord flattening without cord signal changes. Mild left greater than right C6 foraminal narrowing. C6-C7: Degenerative intervertebral disc space narrowing with diffuse disc osteophyte complex. Broad posterior component flattens and partially effaces the ventral thecal sac with resultant mild spinal stenosis. Borderline mild left C7 foraminal narrowing. Right neural foramina remains patent. C7-T1: Trace anterolisthesis. Mild disc bulge with uncovertebral spurring. Mild facet and ligament flavum hypertrophy. No spinal stenosis. Mild left C8 foraminal narrowing. Right neural foramen remains patent. Visualized upper thoracic spine demonstrates no significant finding. IMPRESSION: 1. No acute abnormality within the  cervical spine. No evidence for acute infection or epidural abscess. 2. Normal MRI appearance of the cervical spinal cord. No evidence for myelopathy. 3. Multilevel cervical spondylosis with resultant mild spinal stenosis at C3-4 through C6-7. Associated moderate left C4 foraminal stenosis, with mild bilateral C6 and left C7 foraminal narrowing. Electronically Signed   By: Jeannine Boga M.D.   On: 05/27/2021 23:46   EEG adult  Result Date: 05/27/2021 Lora Havens, MD     05/27/2021  4:07 PM Patient Name: Madoline Bhatt MRN: [id number] Epilepsy Attending: Lora Havens Referring Physician/Provider: Dr Donnetta Simpers Date: 05/27/2021 Duration: 22.48 mins Patient history: 86 year old female with altered mental status.  EEG to evaluate for seizures. Level of alertness: Awake AEDs during EEG study: None Technical aspects: This EEG study was done with scalp electrodes positioned according to the 10-20 International system of electrode placement. Electrical activity was acquired at a sampling rate of 500Hz  and reviewed with a high frequency filter of 70Hz  and a low frequency filter of 1Hz . EEG data were recorded continuously and digitally stored. Description: No posterior dominant rhythm was seen. EEG showed continuous generalized 3 to 6 Hz theta-delta slowing. Generalized periodic discharges with triphasic morphology at  1Hz  were also noted, more prominent when awake/stimulated. Hyperventilation and photic stimulation were not performed.   ABNORMALITY - Periodic discharges with triphasic morphology, generalized ( GPDs) - Continuous slow, generalized IMPRESSION: This study showed generalized periodic discharges with triphasic morphology at  1Hz  which is on the ictal-interictal continuum. However, the morphology, frequency and reactivity to stimulation is more commonly seen due to toxic-metabolic causes like cefepime toxicity, hyerammonemia, renal failure. Additionally there is moderate to severe  diffuse encephalopathy, nonspecific etiology. No seizures were seen throughout the recording. Priyanka O Yadav   VAS US CAROTID  Result Date: 05/28/2021 Carotid Arterial Duplex Study Patient Name:  KAYCIE PEGUES Megna  Date of Exam:   05/28/2021 Medical Rec #: [id number]        Accession #:    [id number] Date of Birth: 09/16/1934         Patient Gender: F Patient Age:   26 years Exam Location:  [facility] Procedure:      VAS US CAROTID Referring Phys: Alferd Patee [facility] --------------------------------------------------------------------------------  Indications:       CVA. Risk Factors:      Hyperlipidemia, Diabetes. Limitations        Today's exam was limited due to the patient's respiratory                    variation and patient positioning, patient immobility. Comparison Study:  04/01/2021 -  Right Carotid: Velocities in the right ICA are                    consistent with a 1-39% stenosis.                     Left Carotid: Velocities in the left ICA are consistent with                    a 1-39%                    stenosis.                     Vertebrals: Bilateral vertebral arteries demonstrate                    antegrade flow. Performing Technologist: Oliver Hum RVT  Examination Guidelines: A complete evaluation includes B-mode imaging, spectral Doppler, color Doppler, and power Doppler as needed of all accessible portions of each vessel. Bilateral testing is considered an integral part of a complete examination. Limited examinations for reoccurring indications may be performed as noted.  Right Carotid Findings: +----------+--------+--------+--------+-----------------------+--------+           PSV cm/sEDV cm/sStenosisPlaque Description     Comments +----------+--------+--------+--------+-----------------------+--------+ CCA Prox  70      11              smooth and heterogenoustortuous +----------+--------+--------+--------+-----------------------+--------+ CCA Distal96      16               smooth and heterogenous         +----------+--------+--------+--------+-----------------------+--------+ ICA Prox  63      17              smooth and heterogenous         +----------+--------+--------+--------+-----------------------+--------+ ICA Distal49      12                                     tortuous +----------+--------+--------+--------+-----------------------+--------+ ECA       139     14                                              +----------+--------+--------+--------+-----------------------+--------+ +----------+--------+-------+--------+-------------------+           PSV cm/sEDV cmsDescribeArm Pressure (mmHG) +----------+--------+-------+--------+-------------------+ Subclavian120                                        +----------+--------+-------+--------+-------------------+ +---------+--------+--+--------+--+----------------------------+ VertebralPSV cm/s81EDV cm/s20Antegrade and High resistant +---------+--------+--+--------+--+----------------------------+  Left Carotid Findings: +----------+--------+--------+--------+-----------------------+--------+           PSV cm/sEDV cm/sStenosisPlaque Description     Comments +----------+--------+--------+--------+-----------------------+--------+ CCA Prox  105     11              smooth and heterogenoustortuous +----------+--------+--------+--------+-----------------------+--------+ CCA Distal69      12              smooth and heterogenous         +----------+--------+--------+--------+-----------------------+--------+ ICA Prox  45      8  calcific                        +----------+--------+--------+--------+-----------------------+--------+ ICA Distal68      19                                     tortuous +----------+--------+--------+--------+-----------------------+--------+ ECA       70      4                                                +----------+--------+--------+--------+-----------------------+--------+ +----------+--------+--------+--------+-------------------+           PSV cm/sEDV cm/sDescribeArm Pressure (mmHG) +----------+--------+--------+--------+-------------------+ Subclavian115                                         +----------+--------+--------+--------+-------------------+ +---------+--------+--+--------+-+----------------------------+ VertebralPSV cm/s35EDV cm/s6Antegrade and High resistant +---------+--------+--+--------+-+----------------------------+   Summary: Right Carotid: Velocities in the right ICA are consistent with a 1-39% stenosis. Left Carotid: Velocities in the left ICA are consistent with a 1-39% stenosis. Vertebrals: Bilateral vertebral arteries demonstrate high resistant flow. *See table(s) above for measurements and observations.     Preliminary      Flora Lipps, MD  [facility] 05/28/2021  If 7PM-7AM, please contact night-coverage

## 2021-05-28 NOTE — Telephone Encounter (Signed)
Have been in contact with family via [place].  Pt currently hospitalized.    Family will reach out when Pt is discharged.

## 2021-05-28 NOTE — Progress Notes (Signed)
Carotid artery duplex has been completed. Preliminary results can be found in CV Proc through chart review.   05/28/21 9:26 AM Carlos Levering RVT

## 2021-05-28 NOTE — Progress Notes (Signed)
PT Cancellation Note  Patient Details Name: Anna Hoffman MRN: [id number] DOB: 12/24/1934   Cancelled Treatment:    Reason Eval/Treat Not Completed: Medical issues which prohibited therapy RN reports pt's cognition is not intact.  Pt is not able to follow commands or responding at this time.   Anna Hoffman 05/28/2021, 10:15 AM Arlyce Dice, DPT Acute Rehabilitation Services Pager: [phone] Office: [phone]

## 2021-05-28 NOTE — Progress Notes (Signed)
Speech Language Pathology Treatment: Dysphagia  Patient Details Name: Anna Hoffman MRN: [id number] DOB: August 06, 1934 Today's Date: 05/28/2021 Time: 7616-0737 SLP Time Calculation (min) (ACUTE ONLY): 40 min  Assessment / Plan / Recommendation Clinical Impression  SLP notified by RN that pt had awakened and was oriented, responsive. Upon arrival, pt's granddaughter was present in the room. Pt accepted trials of ice chips, thin liquid, nectar thick liquid, and puree textures. Ice chips appeared to be tolerated well. Cough response noted following thin liquid. Nectar thick liquid tolerated well without overt s/s aspiration. Puree was tolerated without s/s aspiration, however, pt gagged but did not vomit following boluses of applesauce. Pt was noted to "zone out" several times during this session, raising concern for increased aspiration risk.   At this time, floor stock trials of ice chips, nectar thick liquid, and puree textures are recommended when pt is alert and is requesting something by mouth. When pt mentation is more consistently alert and responsive, will assess pt readiness to liberalize PO intake. SLP will follow in the interim.   HPI HPI: Pt is an 86 yo female adm to [facility] with N/V. Pt has h/o esophagitis, HH, CKD Stage 3A, cardiomyopathy, DM, colitis, HTN, right lobe pna, ICH. CT head Small chronic infarcts in the right  thalamus and left cerebellar hemisphere are unchanged. PT with several admits to hospital in last several months.  Most recent for fall.      SLP Plan  Continue with current plan of care      Recommendations for follow up therapy are one component of a multi-disciplinary discharge planning process, led by the attending physician.  Recommendations may be updated based on patient status, additional functional criteria and insurance authorization.    Recommendations  Diet recommendations: Other(comment) (ice chips, sips of NTL, small bites of puree from floor  stock) Liquids provided via: Straw Medication Administration: Crushed with puree Supervision: Trained caregiver to feed patient Compensations: Slow rate;Small sips/bites;Minimize environmental distractions Postural Changes and/or Swallow Maneuvers: Seated upright 90 degrees;Upright 30-60 min after meal                Oral Care Recommendations: Oral care QID Follow up Recommendations: Other (comment) (TBD depending on progress) SLP Visit Diagnosis: Dysphagia, unspecified (R13.10) Plan: Continue with current plan of care       [place]. Quentin Ore, [facility], [place] Speech Language Pathologist Office: [phone]  Shonna Chock  05/28/2021, 4:41 PM

## 2021-05-28 NOTE — Progress Notes (Signed)
OT Cancellation Note  Patient Details Name: Anna Hoffman MRN: [id number] DOB: Dec 07, 1934   Cancelled Treatment:    Reason Eval/Treat Not Completed: Medical issues which prohibited therapy: Per PT report, "RN reports pt's cognition is not intact.  Pt is not able to follow commands or responding at this time." OT will follow hold for pt safety.   Julien Girt 05/28/2021, 1:36 PM

## 2021-05-28 NOTE — Progress Notes (Signed)
Progress Note  Patient Name: Anna Hoffman Date of Encounter: 05/28/2021  [facility] Cardiologist: Donato Heinz, MD   Subjective   Appears a little more alert, turns head to loud voice, but not following commands or speaking. More tachycardic. Has a murmur today.  Inpatient Medications    Scheduled Meds:  amLODipine  10 mg Oral Daily   diclofenac Sodium  2 g Topical QID   feeding supplement (KATE FARMS STANDARD 1.4)  325 mL Oral BID BM   hydrALAZINE  100 mg Oral Q8H   insulin aspart  0-9 Units Subcutaneous TID WC   metoprolol tartrate  5 mg Intravenous Q8H   multivitamin with minerals  1 tablet Oral Daily   pantoprazole (PROTONIX) IV  40 mg Intravenous Q12H   rosuvastatin  20 mg Oral Daily   sodium chloride flush  3 mL Intravenous Q12H   thiamine injection  100 mg Intravenous Daily   Or   thiamine  100 mg Oral Daily   Continuous Infusions:  lactated ringers 100 mL/hr at 05/28/21 0754   PRN Meds: acetaminophen **OR** acetaminophen, hydrALAZINE, Muscle Rub, ondansetron (ZOFRAN) IV, polyethylene glycol, polyvinyl alcohol   Vital Signs    Vitals:   05/28/21 0423 05/28/21 0656 05/28/21 0758 05/28/21 1152  BP: (!) 151/97 (!) 170/108 (!) 142/82 (!) 143/92  Pulse: (!) 134 (!) 129 (!) 135 (!) 140  Resp: 18 16 20 18   Temp: 97.9 F (36.6 C) 97.6 F (36.4 C) 98 F (36.7 C) 98.7 F (37.1 C)  TempSrc:      SpO2: 95% 95% 96% 95%  Weight:      Height:        Intake/Output Summary (Last 24 hours) at 05/28/2021 1331 Last data filed at 05/28/2021 0408 Gross per 24 hour  Intake 647.34 ml  Output 600 ml  Net 47.34 ml   Last 3 Weights 05/23/2021 05/16/2021 05/10/2021  Weight (lbs) 135 lb 5.8 oz 138 lb 138 lb  Weight (kg) 61.4 kg 62.596 kg 62.596 kg      Telemetry    Sinus tachycardia, rare PVCs, brief NSVT at 7AM today - Personally Reviewed  ECG    No new tracing - Personally Reviewed  Physical Exam  Eyes open, stuporous GEN: No acute distress.    Neck: No JVD Cardiac: RRR, 2/6 mid systolic mururs heard at LLSB and at apex; no diastolic murmurs, rubs, or gallops.  Respiratory: Clear to auscultation bilaterally. GI: Soft, nontender, non-distended  MS: No edema; No deformity. Neuro:  not following commands Psych: unable to assess  Labs    High Sensitivity Troponin:   Recent Labs  Lab 05/09/21 1825  TROPONINIHS 4     Chemistry Recent Labs  Lab 05/22/21 2030 05/22/21 2042 05/23/21 0448 05/24/21 0426 05/25/21 0704 05/26/21 0454 05/27/21 0423 05/28/21 0456  NA 137  --  132*   < > 133* 130* 134* 137  K 4.4  --  3.9   < > 4.0 3.8 4.2 3.9  CL 109  --  108   < > 105 103 104 107  CO2 13*  --  13*   < > 18* 16* 13* 14*  GLUCOSE 150*  --  121*   < > 104* 120* 140* 140*  BUN 38*  --  33*   < > 25* 26* 40* 55*  CREATININE 1.86*  --  1.70*   < > 1.52* 1.55* 2.14* 2.61*  CALCIUM 10.5*  --  9.1   < > 9.5  9.8 10.1 9.8  MG  --    < >  --    < > 2.1 2.2 2.7*  --   PROT 8.8*  --  6.9  --   --   --   --   --   ALBUMIN 5.0  --  3.8  --   --   --   --   --   AST 19  --  17  --   --   --   --   --   ALT 17  --  13  --   --   --   --   --   ALKPHOS 72  --  54  --   --   --   --   --   BILITOT 0.8  --  0.8  --   --   --   --   --   GFRNONAA 26*  --  29*   < > 34* 33* 22* 18*  ANIONGAP 15  --  11   < > 10 11 17* 16*   < > = values in this interval not displayed.    Lipids  Recent Labs  Lab 05/27/21 1645  CHOL 139  TRIG 89  HDL 74  LDLCALC 47  CHOLHDL 1.9    Hematology Recent Labs  Lab 05/26/21 0454 05/27/21 0423 05/28/21 0456  WBC 11.8* 13.9* 19.3*  RBC 5.03 5.20* 4.90  HGB 14.6 15.2* 14.3  HCT 43.8 46.0 43.7  MCV 87.1 88.5 89.2  MCH 29.0 29.2 29.2  MCHC 33.3 33.0 32.7  RDW 15.4 15.9* 16.0*  PLT 435* 476* 429*   Thyroid  Recent Labs  Lab 05/28/21 0456  TSH 1.081  FREET4 1.42*    BNPNo results for input(s): BNP, PROBNP in the last 168 hours.  DDimer No results for input(s): DDIMER in the last 168 hours.    Radiology    CT HEAD WO CONTRAST (5MM)  Result Date: 05/26/2021 CLINICAL DATA:  Mental status change of unknown cause. Lethargy. Hypertension. EXAM: CT HEAD WITHOUT CONTRAST TECHNIQUE: Contiguous axial images were obtained from the base of the skull through the vertex without intravenous contrast. COMPARISON:  05/22/2021.  05/09/2021. FINDINGS: Brain: Old small vessel infarction in the left cerebellum. No focal brainstem finding, other than indentation due to tortuous and ectatic vertebrobasilar vessels. Cerebral hemispheres show old infarction in the right thalamus and extensive chronic small-vessel ischemic changes throughout the cerebral hemispheric white matter. No cortical or large vessel territory infarction. No mass lesion, hemorrhage, hydrocephalus or extra-axial collection. Vascular: There is atherosclerotic calcification of the major vessels at the base of the brain. Skull: Negative Sinuses/Orbits: Chronic inflammatory changes of the left division of the sphenoid sinus. Other sinuses clear. Orbits negative. Other: None IMPRESSION: No acute CT finding. Extensive chronic small-vessel ischemic changes throughout the brain as outlined above. Chronic inflammatory changes of the left division of the sphenoid sinus. Electronically Signed   By: Nelson Chimes M.D.   On: 05/26/2021 17:44   MR ANGIO HEAD WO CONTRAST  Result Date: 05/27/2021 CLINICAL DATA:  Initial evaluation for neuro deficit, stroke suspected. EXAM: MRA HEAD WITHOUT CONTRAST TECHNIQUE: Angiographic images of the Circle of Willis were acquired using MRA technique without intravenous contrast. COMPARISON:  MRI from earlier the same day. FINDINGS: Anterior circulation: Visualized distal cervical segments of the internal carotid arteries are patent with antegrade flow. Petrous and cavernous segments patent bilaterally. Atheromatous change seen involving the supraclinoid segments of both internal  carotid arteries bilaterally. Associated up  to approximate moderate stenosis, left worse than right. A1 segments patent bilaterally, with the left being slightly hypoplastic. Normal anterior communicating artery complex. Both ACAs patent to their distal aspects without flow-limiting stenosis. M1 segments patent bilaterally. Normal MCA bifurcations. Distal MCA branches demonstrate diffuse small vessel atheromatous irregularity but are well perfused without proximal branch occlusion. Posterior circulation: Visualized vertebral arteries are somewhat dolichoectatic and tortuous without stenosis. Right PICA origin widely patent. Left PICA origin not definitely visualized. Basilar somewhat dolichoectatic as well, most pronounced proximally. Basilar widely patent without stenosis. Superior cerebellar arteries are patent bilaterally. Both PCAs primarily supplied via the basilar. Moderate atheromatous change throughout the right PCA with associated moderate multifocal P2/P3 stenoses. Additional mild-to-moderate atheromatous change noted within the left PCAs well without high-grade stenosis. Anatomic variants: None significant. Other: No intracranial aneurysm. IMPRESSION: 1. Negative intracranial MRA for large vessel occlusion. 2. Moderate intracranial atherosclerotic disease involving the supraclinoid ICAs and PCAs with associated moderate multifocal stenoses as above. 3. Dolichoectatic vertebrobasilar system, suggesting chronic underlying hypertension. Electronically Signed   By: Jeannine Boga M.D.   On: 05/27/2021 23:37   MR BRAIN WO CONTRAST  Result Date: 05/27/2021 CLINICAL DATA:  Mental status change, unknown cause. Additional history provided: Mental status change, possible sepsis, recent fall. EXAM: MRI HEAD WITHOUT CONTRAST TECHNIQUE: Multiplanar, multiecho pulse sequences of the brain and surrounding structures were obtained without intravenous contrast. COMPARISON:  Head CT 05/26/2021. FINDINGS: Brain: Mild-to-moderate cerebral atrophy.  Comparatively mild cerebellar atrophy. 2 mm acute/early subacute infarct with posterior right frontal lobe white matter (series 5, image 91). 3 mm acute/early subacute infarct within the callosal splenium just to the right of midline (versus artifact) (for instance as seen on series 5, image 83). Advanced multifocal T2 FLAIR hyperintense signal abnormality within the cerebral white matter, nonspecific but compatible with chronic small vessel ischemic disease. Chronic small vessel ischemic changes are also present within the bilateral basal ganglia, thalami and pons. Chronic lacunar infarcts within the right thalamus and bilateral cerebellar hemispheres. Supratentorial and infratentorial chronic microhemorrhages, most notably within the bilateral thalami, within the brainstem and within the bilateral cerebellar hemispheres. These likely reflect sequela of chronic hypertensive microangiopathy. No evidence of an intracranial mass. No extra-axial fluid collection. No midline shift. Vascular: Maintained flow voids within the proximal large arterial vessels. Skull and upper cervical spine: No focal suspicious marrow lesion. Incompletely assessed cervical spondylosis. Mild C3-C4 grade 1 anterolisthesis. Sinuses/Orbits: Visualized orbits show no acute finding. Bilateral lens replacements. Mild mucosal thickening within the right frontoethmoidal recess. Moderate fluid level within the left sphenoid sinus. IMPRESSION: 2 mm acute/early subacute infarct within the posterior right frontal lobe white matter. An additional 3 mm acute/early subacute infarct is questioned within the callosal splenium (versus artifact). Advanced chronic small vessel ischemic disease within the cerebral white matter. Chronic small vessel ischemic changes are also present within the bilateral basal ganglia, thalami and pons. Chronic lacunar infarcts within the right thalamus and bilateral cerebellar hemispheres. Supratentorial and infratentorial  chronic microhemorrhages, most notably within the thalami, brainstem and cerebellar hemispheres. These findings likely reflect sequela of chronic hypertensive microangiopathy. Mild-to-moderate generalized cerebral atrophy. Comparatively mild cerebellar atrophy. Incompletely assessed cervical spondylosis. Mild C3-C4 grade 1 anterolisthesis. Paranasal sinus disease, as described. Electronically Signed   By: Kellie Simmering D.O.   On: 05/27/2021 11:22   MR CERVICAL SPINE WO CONTRAST  Result Date: 05/27/2021 CLINICAL DATA:  Initial evaluation for myelopathy, acute or progressive by epidural abscess. EXAM: MRI CERVICAL SPINE  WITHOUT CONTRAST TECHNIQUE: Multiplanar, multisequence MR imaging of the cervical spine was performed. No intravenous contrast was administered. COMPARISON:  CT from 03/31/2021. FINDINGS: Alignment: Straightening of the normal cervical lordosis. Associated stepwise grade 1 anterolisthesis of C3 on C4, C4 on C5, C7 on T1, and T2 on T3, likely chronic and facet mediated. Vertebrae: Vertebral body height maintained without acute or chronic fracture. Bone marrow signal intensity within normal limits. No discrete or worrisome osseous lesions. No findings to suggest osteomyelitis discitis or septic arthritis. Cord: Normal signal and morphology. No cord signal changes to suggest myelopathy. No epidural abscess or other collection. Posterior Fossa, vertebral arteries, paraspinal tissues: Chronic microvascular ischemic changes noted within the visualized pons/brainstem. Craniocervical junction normal. Paraspinous and prevertebral soft tissues within normal limits. Normal flow voids seen within the vertebral arteries bilaterally. Disc levels: C2-C3: Mild disc bulge. Mild left greater than right facet hypertrophy. No canal or foraminal stenosis. C3-C4: Grade 1 anterolisthesis with intervertebral disc space narrowing and diffuse disc bulge. Right paracentral disc protrusion indents the right ventral thecal  sac, contacting the ventral cord (series 9, image 8). Minimal cord flattening without cord signal changes. Superimposed left-sided uncovertebral and facet hypertrophy with resultant moderate left C4 foraminal stenosis. Right neural foramina remains patent. C4-C5: Degenerative intervertebral disc space narrowing with grade 1 anterolisthesis. Broad-based left paracentral disc osteophyte complex indents the ventral thecal sac, contacting and minimally flattening the ventral cord. No cord signal changes. Mild spinal stenosis. Superimposed mild right-sided facet degeneration. No significant foraminal encroachment. C5-C6: Advanced degenerative intervertebral disc space narrowing with diffuse disc osteophyte complex. Broad posterior component flattens and partially faces the ventral thecal sac with resultant mild spinal stenosis, asymmetric to the left. Mild cord flattening without cord signal changes. Mild left greater than right C6 foraminal narrowing. C6-C7: Degenerative intervertebral disc space narrowing with diffuse disc osteophyte complex. Broad posterior component flattens and partially effaces the ventral thecal sac with resultant mild spinal stenosis. Borderline mild left C7 foraminal narrowing. Right neural foramina remains patent. C7-T1: Trace anterolisthesis. Mild disc bulge with uncovertebral spurring. Mild facet and ligament flavum hypertrophy. No spinal stenosis. Mild left C8 foraminal narrowing. Right neural foramen remains patent. Visualized upper thoracic spine demonstrates no significant finding. IMPRESSION: 1. No acute abnormality within the cervical spine. No evidence for acute infection or epidural abscess. 2. Normal MRI appearance of the cervical spinal cord. No evidence for myelopathy. 3. Multilevel cervical spondylosis with resultant mild spinal stenosis at C3-4 through C6-7. Associated moderate left C4 foraminal stenosis, with mild bilateral C6 and left C7 foraminal narrowing. Electronically  Signed   By: Jeannine Boga M.D.   On: 05/27/2021 23:46   EEG adult  Result Date: 05/27/2021 Lora Havens, MD     05/27/2021  4:07 PM Patient Name: Annalea Alguire MRN: [id number] Epilepsy Attending: Lora Havens Referring Physician/Provider: Dr Donnetta Simpers Date: 05/27/2021 Duration: 22.48 mins Patient history: 86 year old female with altered mental status.  EEG to evaluate for seizures. Level of alertness: Awake AEDs during EEG study: None Technical aspects: This EEG study was done with scalp electrodes positioned according to the 10-20 International system of electrode placement. Electrical activity was acquired at a sampling rate of 500Hz  and reviewed with a high frequency filter of 70Hz  and a low frequency filter of 1Hz . EEG data were recorded continuously and digitally stored. Description: No posterior dominant rhythm was seen. EEG showed continuous generalized 3 to 6 Hz theta-delta slowing. Generalized periodic discharges with triphasic morphology at  1Hz  were also  noted, more prominent when awake/stimulated. Hyperventilation and photic stimulation were not performed.   ABNORMALITY - Periodic discharges with triphasic morphology, generalized ( GPDs) - Continuous slow, generalized IMPRESSION: This study showed generalized periodic discharges with triphasic morphology at  1Hz  which is on the ictal-interictal continuum. However, the morphology, frequency and reactivity to stimulation is more commonly seen due to toxic-metabolic causes like cefepime toxicity, hyerammonemia, renal failure. Additionally there is moderate to severe diffuse encephalopathy, nonspecific etiology. No seizures were seen throughout the recording. Priyanka O Yadav   VAS US CAROTID  Result Date: 05/28/2021 Carotid Arterial Duplex Study Patient Name:  SERAYAH YAZDANI Legler  Date of Exam:   05/28/2021 Medical Rec #: [id number]        Accession #:    [id number] Date of Birth: 23-Apr-1935         Patient Gender: F Patient  Age:   37 years Exam Location:  [facility] Procedure:      VAS US CAROTID Referring Phys: [facility] [place] --------------------------------------------------------------------------------  Indications:       CVA. Risk Factors:      Hyperlipidemia, Diabetes. Limitations        Today's exam was limited due to the patient's respiratory                    variation and patient positioning, patient immobility. Comparison Study:  04/01/2021 - Right Carotid: Velocities in the right ICA are                    consistent with a 1-39% stenosis.                     Left Carotid: Velocities in the left ICA are consistent with                    a 1-39%                    stenosis.                     Vertebrals: Bilateral vertebral arteries demonstrate                    antegrade flow. Performing Technologist: Oliver Hum RVT  Examination Guidelines: A complete evaluation includes B-mode imaging, spectral Doppler, color Doppler, and power Doppler as needed of all accessible portions of each vessel. Bilateral testing is considered an integral part of a complete examination. Limited examinations for reoccurring indications may be performed as noted.  Right Carotid Findings: +----------+--------+--------+--------+-----------------------+--------+           PSV cm/sEDV cm/sStenosisPlaque Description     Comments +----------+--------+--------+--------+-----------------------+--------+ CCA Prox  70      11              smooth and heterogenoustortuous +----------+--------+--------+--------+-----------------------+--------+ CCA Distal96      16              smooth and heterogenous         +----------+--------+--------+--------+-----------------------+--------+ ICA Prox  63      17              smooth and heterogenous         +----------+--------+--------+--------+-----------------------+--------+ ICA Distal49      12  tortuous  +----------+--------+--------+--------+-----------------------+--------+ ECA       139     14                                              +----------+--------+--------+--------+-----------------------+--------+ +----------+--------+-------+--------+-------------------+           PSV cm/sEDV cmsDescribeArm Pressure (mmHG) +----------+--------+-------+--------+-------------------+ Subclavian120                                        +----------+--------+-------+--------+-------------------+ +---------+--------+--+--------+--+----------------------------+ VertebralPSV cm/s81EDV cm/s20Antegrade and High resistant +---------+--------+--+--------+--+----------------------------+  Left Carotid Findings: +----------+--------+--------+--------+-----------------------+--------+           PSV cm/sEDV cm/sStenosisPlaque Description     Comments +----------+--------+--------+--------+-----------------------+--------+ CCA Prox  105     11              smooth and heterogenoustortuous +----------+--------+--------+--------+-----------------------+--------+ CCA Distal69      12              smooth and heterogenous         +----------+--------+--------+--------+-----------------------+--------+ ICA Prox  45      8               calcific                        +----------+--------+--------+--------+-----------------------+--------+ ICA Distal68      19                                     tortuous +----------+--------+--------+--------+-----------------------+--------+ ECA       70      4                                               +----------+--------+--------+--------+-----------------------+--------+ +----------+--------+--------+--------+-------------------+           PSV cm/sEDV cm/sDescribeArm Pressure (mmHG) +----------+--------+--------+--------+-------------------+ Subclavian115                                          +----------+--------+--------+--------+-------------------+ +---------+--------+--+--------+-+----------------------------+ VertebralPSV cm/s35EDV cm/s6Antegrade and High resistant +---------+--------+--+--------+-+----------------------------+   Summary: Right Carotid: Velocities in the right ICA are consistent with a 1-39% stenosis. Left Carotid: Velocities in the left ICA are consistent with a 1-39% stenosis. Vertebrals: Bilateral vertebral arteries demonstrate high resistant flow. *See table(s) above for measurements and observations.     Preliminary     Cardiac Studies   ECHO 04/01/2021  2D echo 04/01/21   1. Left ventricular ejection fraction, by estimation, is 65 to 70%. The  left ventricle has hyperdynamic function. The left ventricle has no  regional wall motion abnormalities. There is severe focal basal septal  left ventricular hypertrophy, no LV  outflow tract gradient. Left ventricular diastolic parameters are  consistent with Grade I diastolic dysfunction (impaired relaxation). The  average left ventricular global longitudinal strain is -20.7 %. The global  longitudinal strain is normal.   2. Right ventricular systolic function is normal. The right ventricular  size is normal. There is normal pulmonary artery systolic pressure.  The  estimated right ventricular systolic pressure is 38.1 mmHg.   3. Left atrial size was mildly dilated.   4. The mitral valve is normal in structure. Trivial mitral valve  regurgitation. No evidence of mitral stenosis.   5. The aortic valve is tricuspid. Aortic valve regurgitation is mild.  Mild aortic valve sclerosis is present, with no evidence of aortic valve  stenosis.   6. Aortic dilatation noted. There is mild dilatation of the ascending  aorta, measuring 41 mm.   7. The inferior vena cava is normal in size with greater than 50%  respiratory variability, suggesting right atrial pressure of 3 mmHg.   Patient Profile     86 y.o. female  with hypertrophic cardiomyopathy, PSVT, prior 2:1 AV block, syncope 03/2021 (recurrent 1 month later), esophagitis, CKD stage 3a, HTN, ascending aortic dilation, prior intracerebral hemorrhage 2017 and ischemic CVA, prolonged QT, diabetes.   Dr. Lovena Le recommended PPM insertion and the patient wished to wait. He recommended to wean off her beta blocker. More recently notes indicate a plan to move forward with PPM implantation. She had recent right shoulder injury.    She presented with nausea, vomiting, weakness, and worsening lethargy/confusion with SIRS criteria of unclear etiology. Over the course of several days, lab workup has demonstrated AKI on CKD, hyponatremia, leukocytosis. MRi shows two small subacute strokes on R.  Assessment & Plan    SIRS/encephalopathy: source uncertain. UA bland, CXR and CT abd/pelvis negative, BCx -ve to date, flu/SARS -ve. LP delayed due to anticoagulation. Consider TEE for endocarditis. Sinus tachycardia:  carotid sinus compression yesterday confirms this is sinus tachy, reactive/physiological, probably related to same cause as her SIRS. Normally would not treat, but try to reverse the underlying cause. In her case, the tachycardia may worsen issues related to HCM. Started beta blockers. HCM: she has a significant murmur (s) on exam today, likely due to LVOT obstruction and MR in setting of HCM. Hemodynamics will worsen with hypovolemia (not eating/drinking), beta blocker withdrawal, use of vasodilators (for BP control), all of which are oprating here. This places her at risk for CHF exacerbation or syncope duee to obstruction. Started IV metoprolol since she cannot take PO. She is receiving IV fluids, this will also help. Hx of 2nd deg AV block: not an issue right now - conducting 1:1 at 140 bpm. Reassess for pacemaker once acute illness resolves. AKI on CKD 3a: baseline creatinine 1.2 now steadily worsening to 2.6 Subacute CVA x 2: unlikely to explain profound  encephalopathy. Raise concern for embolic stroke - again, may need to consider endocarditis. At this point, no available TEE slots until Friday.     For questions or updates, please contact [place] Please consult [email or web address] for contact info under        Signed, Sanda Klein, MD  05/28/2021, 1:31 PM

## 2021-05-28 NOTE — Chronic Care Management (AMB) (Signed)
Chronic Care Management    Social Work Note  05/28/2021 Name: Anna Hoffman MRN: [id number] DOB: 06/16/1935  Anna Hoffman is a 86 y.o. year old female who is a primary care patient of Glendale Chard, MD. The CCM team was consulted to assist the patient with chronic disease management and/or care coordination needs related to:  HTN, Colitis, DM II .   Collaboration with Dr. Baird Cancer  for  case discussion  in response to provider referral for social work chronic care management and care coordination services.   Consent to Services:  The patient was given information about Chronic Care Management services, agreed to services, and gave verbal consent prior to initiation of services.  Please see initial visit note for detailed documentation.   Patient agreed to services and consent obtained.   Assessment: Review of patient past medical history, allergies, medications, and health status, including review of relevant consultants reports was performed today as part of a comprehensive evaluation and provision of chronic care management and care coordination services.     SDOH (Social Determinants of Health) assessments and interventions performed:    Advanced Directives Status: Not addressed in this encounter.  CCM Care Plan  Allergies  Allergen Reactions   Amoxicillin Diarrhea    Has patient had a PCN reaction causing immediate rash, facial/tongue/throat swelling, SOB or lightheadedness with hypotension: no Has patient had a PCN reaction causing severe rash involving mucus membranes or skin necrosis: no Has patient had a PCN reaction that required hospitalization: no pharmacist consult Has patient had a PCN reaction occurring within the last 10 years: yes If all of the above answers are "NO", then may proceed with Cephalosporin use.    Ampicillin Rash    - Tolerates Rocephin and Ancef - Remote occurrence; no symptoms of anaphylaxis or severe cutaneous reaction, and no additional medical  attention required     Sulfa Antibiotics Rash    Facility-Administered Encounter Medications as of 05/28/2021  Medication   acetaminophen (TYLENOL) tablet 650 mg   Or   acetaminophen (TYLENOL) suppository 650 mg   amLODipine (NORVASC) tablet 10 mg   diclofenac Sodium (VOLTAREN) 1 % topical gel 2 g   feeding supplement (KATE FARMS STANDARD 1.4) liquid 325 mL   hydrALAZINE (APRESOLINE) injection 10 mg   hydrALAZINE (APRESOLINE) tablet 100 mg   insulin aspart (novoLOG) injection 0-9 Units   lactated ringers infusion   metoprolol tartrate (LOPRESSOR) injection 5 mg   multivitamin with minerals tablet 1 tablet   Muscle Rub CREA 1 application   ondansetron (ZOFRAN) injection 4 mg   pantoprazole (PROTONIX) injection 40 mg   polyethylene glycol (MIRALAX / GLYCOLAX) packet 17 g   polyvinyl alcohol (LIQUIFILM TEARS) 1.4 % ophthalmic solution 1 drop   rosuvastatin (CRESTOR) tablet 20 mg   sodium chloride flush (NS) 0.9 % injection 3 mL   thiamine (B-1) injection 100 mg   Or   thiamine tablet 100 mg   Outpatient Encounter Medications as of 05/28/2021  Medication Sig Note   acetaminophen (TYLENOL) 325 MG tablet Take 2 tablets (650 mg total) by mouth every 6 (six) hours as needed for mild pain (or Fever >/= 101).    Ascorbic Acid (VITAMIN C) 1000 MG tablet Take 1,000 mg by mouth daily.    azelastine (ASTELIN) 0.1 % nasal spray Place 2 sprays into both nostrils daily as needed for rhinitis.    Carboxymethylcellul-Glycerin (LUBRICATING EYE DROPS OP) Place 1 drop into both eyes daily as needed (dry eyes).  Cyanocobalamin (VITAMIN B12) 500 MCG TABS Take 500 mcg by mouth daily.    fexofenadine (ALLEGRA ALLERGY) 180 MG tablet Take 1 tablet (180 mg total) by mouth daily.    glucose blood test strip Use as directed to check blood sugars 1 time per day.    glucose monitoring kit (FREESTYLE) monitoring kit Use as directed to check blood sugars 1 time per day dx: e11.22    HYDROcodone-acetaminophen  (NORCO/VICODIN) 5-325 MG tablet Take 1 tablet by mouth every 6 (six) hours as needed. (Patient taking differently: Take 1 tablet by mouth every 6 (six) hours as needed for moderate pain.)    Lancets (ONETOUCH DELICA PLUS [id number]) MISC Use to check blood sugar once daily    metoprolol tartrate (LOPRESSOR) 50 MG tablet Take 50 mg by mouth 2 (two) times daily. 05/24/2021: Time unk   pantoprazole (PROTONIX) 40 MG tablet Give 1 tablet by mouth 2 times a day related to gastro-esophageal reflux disease without esophagitis    prochlorperazine (COMPAZINE) 5 MG tablet Take 1 tablet (5 mg total) by mouth every 6 (six) hours as needed for nausea or vomiting.    rosuvastatin (CRESTOR) 20 MG tablet Take 1 tablet (20 mg total) by mouth daily.    trolamine salicylate (ASPERCREME) 10 % cream Apply 1 application topically daily as needed for muscle pain.     Patient Active Problem List   Diagnosis Date Noted   Malnutrition of moderate degree 05/24/2021   Sepsis ([place]) 05/23/2021   SIRS (systemic inflammatory response syndrome) ([place]) 05/22/2021   History of CVA (cerebrovascular accident) 05/22/2021   Gait instability 04/24/2021   Seasonal allergic rhinitis due to pollen 04/24/2021   Ascending aorta dilatation ([place]) 04/05/2021   HOCM (hypertrophic obstructive cardiomyopathy) ([place]) 04/04/2021   Physical deconditioning 04/04/2021   Syncope 04/03/2021   Fall at home, initial encounter 03/31/2021   Thyroid nodule 03/31/2021   CAP (community acquired pneumonia) 11/15/2020   Dehydration 11/15/2020   Erosive esophagitis    Hiatal hernia    Hyponatremia 10/10/2020   Acute kidney injury superimposed on chronic kidney disease ([place]) 10/10/2020   Aortic atherosclerosis ([place]) 10/09/2020   Acute renal failure superimposed on stage 3a chronic kidney disease ([place]) 10/09/2020   Hyperkalemia 10/09/2020   Hearing loss 10/09/2020   Prolonged QT interval 10/09/2020   Colitis, acute 08/11/2019   Colitis 07/11/2019    Loose stools 06/07/2019   Herpes zoster without complication 53/74/8270   Other long term (current) drug therapy 08/12/2018   Chronic kidney disease, stage 3a ([place]) 07/19/2018   Hypertensive nephropathy 07/19/2018   Community acquired pneumonia of right upper lobe of lung 11/07/2017   Hypertensive emergency 07/01/2016   Thalamic hemorrhage ([place]) 07/01/2016   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[place])    Benign essential HTN    Type 2 diabetes mellitus with stage 3 chronic kidney disease, without long-term current use of insulin ([place])    Mixed hyperlipidemia    ICH (intracerebral hemorrhage) ([place]) - hypertensive R thalamic hemorrhage  06/27/2016   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   Retroperitoneal bleed 02/08/2016   Generalized abdominal pain    Gastroenteritis 02/04/2016   Diarrhea 02/04/2016   Hypokalemia 02/04/2016   Incarcerated paraesophageal hernia 09/02/2014   Renal mass, left 09/02/2014   Acute esophagitis 09/02/2014   Gastric outlet obstruction 09/02/2014   Hypertension    Vomiting 09/01/2014    Conditions to be addressed/monitored: HTN, DMII, and Colitis  Care Plan : Social Work Care Plan  Updates made by Daneen Schick  since 05/28/2021 12:00 AM     Problem: Mobility and Independence      Goal: Mobility and Independence Optimized   Start Date: 04/24/2021  Expected End Date: 06/08/2021  Priority: High  Note:   Current Barriers:  Chronic disease management support and education needs related to HTN and DM  ADL IADL limitations and Limited access to caregiver  Social Worker Clinical Goal(s):  patient will work with SW to identify and address any acute and/or chronic care coordination needs related to the self health management of HTN and DM  patient will work with SW to address concerns related to level of care needs  SW Interventions:  Inter-disciplinary care team collaboration (see longitudinal plan of care) Collaboration with Glendale Chard, MD regarding  development and update of comprehensive plan of care as evidenced by provider attestation and co-signature Collaboration with Dr. Baird Cancer to determine appropriate level of care is assisted living Communication received from patients son that he has completed long term care Medicaid application and submitted to DSS Performed chart review to note patient remains inpatient at this time Collaboration with Dr. Baird Cancer via phone to discuss SW will plan to wait on completion of FL2 until after patient is discharged as it is likely her medical needs will change Discussed with Dr. Baird Cancer the patients son is seeking guidance on how to obtain power of attorney Advised Dr. Baird Cancer patients son should first speak with inpatient team to determine if they have staff to assist. If not, he could seek guardianship through the clerk of court office SW will continue to follow  Patient Goals/Self-Care Activities patient will: with the help of her son  -  Work with SW to address desire for placement -Complete Medicaid application  Follow Up Plan:  SW will monitor patients chart to assist upon discharge       Follow Up Plan:  SW will continue to follow      Daneen Schick, BSW, CDP Social Worker, Certified Dementia Practitioner [place] [facility] [phone]

## 2021-05-28 NOTE — Progress Notes (Signed)
SLP Cancellation Note  Patient Details Name: [name] MRN: [id number] DOB: [birth year]   Cancelled treatment:       Reason Eval/Treat Not Completed: Consulted with RN, who reports pt continues to be inappropriate for PO trials at this time. Will continue efforts.  Chyenne Sobczak B. Quentin Ore, [facility], [place] Speech Language Pathologist Office: [phone]  Shonna Chock 05/28/2021, 12:50 PM

## 2021-05-29 ENCOUNTER — Ambulatory Visit: Payer: Medicare Other | Admitting: Physician Assistant

## 2021-05-29 ENCOUNTER — Inpatient Hospital Stay (HOSPITAL_COMMUNITY)
Admit: 2021-05-29 | Discharge: 2021-05-29 | Disposition: A | Discharge status: Home / Self Care | Payer: Medicare Other | Attending: Internal Medicine | Admitting: Internal Medicine

## 2021-05-29 DIAGNOSIS — R6510 Systemic inflammatory response syndrome (SIRS) of non-infectious origin without acute organ dysfunction: Secondary | ICD-10-CM | POA: Diagnosis not present

## 2021-05-29 LAB — CBC
HCT: 35.8 % — ABNORMAL LOW (ref 36.0–46.0)
Hemoglobin: 11.7 g/dL — ABNORMAL LOW (ref 12.0–15.0)
MCH: 29.3 pg (ref 26.0–34.0)
MCHC: 32.7 g/dL (ref 30.0–36.0)
MCV: 89.5 fL (ref 80.0–100.0)
Platelets: 340 10*3/uL (ref 150–400)
RBC: 4 MIL/uL (ref 3.87–5.11)
RDW: 15.9 % — ABNORMAL HIGH (ref 11.5–15.5)
WBC: 16.4 10*3/uL — ABNORMAL HIGH (ref 4.0–10.5)
nRBC: 0 % (ref 0.0–0.2)

## 2021-05-29 LAB — CSF CELL COUNT WITH DIFFERENTIAL
RBC Count, CSF: 0 /mm3
Tube #: 4
WBC, CSF: 2 /mm3 (ref 0–5)

## 2021-05-29 LAB — GLUCOSE, CAPILLARY
Glucose-Capillary: 132 mg/dL — ABNORMAL HIGH (ref 70–99)
Glucose-Capillary: 125 mg/dL — ABNORMAL HIGH (ref 70–99)
Glucose-Capillary: 87 mg/dL (ref 70–99)
Glucose-Capillary: 119 mg/dL — ABNORMAL HIGH (ref 70–99)

## 2021-05-29 LAB — BASIC METABOLIC PANEL
Anion gap: 10 (ref 5–15)
BUN: 56 mg/dL — ABNORMAL HIGH (ref 8–23)
CO2: 16 mmol/L — ABNORMAL LOW (ref 22–32)
Calcium: 8.7 mg/dL — ABNORMAL LOW (ref 8.9–10.3)
Chloride: 109 mmol/L (ref 98–111)
Creatinine, Ser: 2.38 mg/dL — ABNORMAL HIGH (ref 0.44–1.00)
GFR, Estimated: 20 mL/min — ABNORMAL LOW (ref >60)
Glucose, Bld: 125 mg/dL — ABNORMAL HIGH (ref 70–99)
Potassium: 3.2 mmol/L — ABNORMAL LOW (ref 3.5–5.1)
Sodium: 135 mmol/L (ref 135–145)

## 2021-05-29 LAB — MAGNESIUM: Magnesium: 2.4 mg/dL (ref 1.7–2.4)

## 2021-05-29 LAB — GLUCOSE, CSF: Glucose, CSF: 72 mg/dL — ABNORMAL HIGH (ref 40–70)

## 2021-05-29 LAB — PROTEIN, CSF: Total  Protein, CSF: 47 mg/dL — ABNORMAL HIGH (ref 15–45)

## 2021-05-29 LAB — PROCALCITONIN: Procalcitonin: 0.56 ng/mL

## 2021-05-29 MED ORDER — POTASSIUM CHLORIDE CRYS ER 20 MEQ PO TBCR
40.0000 meq | EXTENDED_RELEASE_TABLET | Freq: Once | ORAL | Status: DC
  Filled 2021-05-29: qty 2

## 2021-05-29 MED ORDER — LIDOCAINE HCL (PF) 1 % IJ SOLN
INTRAMUSCULAR | Status: AC
  Administered 2021-05-29: 10 mL via INTRADERMAL
  Filled 2021-05-29: qty 5

## 2021-05-29 NOTE — Progress Notes (Signed)
Nutrition Follow-up  DOCUMENTATION CODES:   Non-severe (moderate) malnutrition in context of chronic illness  INTERVENTION:  - continue [employer] 1.4 PO BID; can mix simply thick with it to make nectar-thick. - will monitor for recommendation from SLP.   NUTRITION DIAGNOSIS:   Moderate Malnutrition related to chronic illness as evidenced by moderate fat depletion, moderate muscle depletion, severe muscle depletion. -ongoing  GOAL:   Patient will meet greater than or equal to 90% of their needs -unmet  MONITOR:   PO intake, Supplement acceptance, Labs, Weight trends  ASSESSMENT:   86 y.o. female with medical history of esophagitis, stage 3 CKD, HTN, colitis, hypertrophic cardiomyopathy and intracerebral hemorrhage, and DM. She presented to the ED due to N/V and weakness for 2-3 days and confusion and difficulty ambulating for several weeks. CXR, CT head, and CT pelvis were negative except for L renal angiomyolipoma.  Patient noted to be in Diagnostic Radiology for lumbar puncture. She is ordered a Heart Healthy diet and has been eating mainly 0% since 10/22 (did eat 10% of lunch and 5% of dinner on that day). She is noted to have eaten 0% of breakfast and lunch today.   Review of order indicates she has accepted [employer] each time it has been offered to her; unsure of how much was consumed each time.   She is noted to be a/o to person and place today.   She has not been weighed since admission on 10/26. No edema present over the past 3 days. She is noted to be +2.95 L since admission.   SLP note from this afternoon states plan to follow-up with patient on 10/27 and that SLP note from 10/25 recommended dysphagia 1, nectar-thick when patient is fully alert and able to sit upright.    Labs reviewed; CBGs: 132 and 125 mg/dl, K: 3.2 mmol/l, BUN: 56 mg/dl, creatinine: 2.38 mg/dl, Ca: 8.7 mg/dl, GFR: 20 ml/min.   Medications reviewed; sliding scale novolog, 1 tablet multivitamin  with minerals/day, 40 mg IV protonix BID, 40 mEq Klor-Con x1 dose 10/26, 100 mg thiamine/day.   IVF; LR @ 100 ml/hr.    Diet Order:   Diet Order     None       EDUCATION NEEDS:   Education needs have been addressed  Skin:  Skin Assessment: Reviewed RN Assessment  Last BM:  10/20 (type 5)  Height:   Ht Readings from Last 1 Encounters:  05/23/21 5\' 5"  (1.651 m)    Weight:   Wt Readings from Last 1 Encounters:  05/23/21 61.4 kg     Estimated Nutritional Needs:  Kcal:  1540-1720 kcal Protein:  75-90 grams Fluid:  >/= 1.7 L/day      Jarome Matin, MS, RD, LDN, CNSC Inpatient Clinical Dietitian RD pager # available in [place]  After hours/weekend pager # available in [facility]

## 2021-05-29 NOTE — Progress Notes (Signed)
PROGRESS NOTE    Anna Hoffman  UEA:[id number] DOB: Nov 29, 1934 DOA: 05/22/2021 PCP: Glendale Chard, MD   Chief Complaint  Patient presents with   Emesis   Brief Narrative/Hospital Course:  Anna Hoffman, 86 y.o. female with PMH of esophagitis, CKD stage III, HTN, colitis, hypertrophic cardiomyopathy and intracerebral hemorrhage, diabetes   presented to hospital with nausea vomiting and weakness for 2 to 3 days with difficulty ambulating and confusion for few weeks.  She also had a fall with some trauma to the right shoulder.  In the ED, patient was mildly tachycardic and hypertensive.  Creatinine was elevated at 1.82 from baseline 1.2.  There was mild leukocytosis but hemoglobin was 16.1.  X-ray of chest, CT head scan and pelvic scan were negative for acute findings except for left renal angiomyolipoma.  Patient received vancomycin cefepime and Flagyl in the ED and was admitted to hospital after IV fluid bolus.     At this time, cultures have been negative and  antibiotics have been discontinued after completion of 3-day course of cefepime.  Patient continues to be weak and debilitated and PT has recommended skilled nursing facility placement.  Cardiology has seen the patient  for her history of intermittent heart block and question hypertrophic cardiomyopathy and has been adjusted on medications. On  05/26/2021, patient had accelerated hypertension and was less responsive.  Neurology was consulted.  Concern for stroke and cefepime induced neurotoxicity.  MRI showed minute stroke.  Subjective:  Seen examined Patient snoring. Woke up- able to tell me her name and her DOB, and that she is in the hospital, says it is May 25 1921 Able to move rt side but not left side Overnight WBC downtrending renal function improving, potassium low. Afebrile.  Assessment & Plan:  SIRS: On presentation tachycardia elevated lactic acid, WBC count but no obvious source.  CT abdomen pelvis no acute  finding MRSA PCR negative COVID-19 negative blood cultures from cultures negative so far.  Patient was being managed with broad-spectrum biotic, subsequently due to concern about cefepime induced neurotoxicity and encephalopathy that has been discontinued.  Monitor off antibiotics.  WBC count remains elevated at 16.4 downtrending repeat in a.m. check procalcitonin.  Acute encephalopathy: Unclear etiology likely multifactorial, LP pending today to rule out CNS etiology infection less likely.  Reported history of mild dementia.  Patient seems to be more alert awake and interactive compared to yesterday.  Neurology on board MRI of the brain showed 2 mm stroke in the posterior right frontal lobe and 3 mm stroke in the callosal splenium-Per neurology less likely to cause profound encephalopathy.  Continue supportive care PT OT, speech eval for diet, follow-up LP results.  Patient w/ weakness on the left side, MRI C-spine foraminal stenosis and spondylosis multilevel.  Acute/early subacute infarct 2 mm on the posterior right frontal lobe and 3 mm in the callosal splenium: Patient has weakness on the left side upper lower extremities, neurology on board underwent further work-up with MRA angio head and neck with no large vessel occlusion moderate intracranial atherosclerotic disease, carotid duplex 1 to 10% stenosis vertebral arteries with high resistant flow, recent echo on 8/29 and not repeated.  HbA1c stable 6.2 lipid panel with LDL control 47.  Continue Crestor, amlodipine, neurology following  AKI on CKD stage IIIa: Baseline creat~1.2, peaked 2.3.  Monitor Hypokalemia: Replete orally Metabolic acidosis bicarb at 16, improving from 14 yesterday.  On IV fluids  Sinus tachycardia-cardiology following heart rate stable now on IV Lopressor continues  to p.o. 12.5 twice daily once able to take orally Benign essential HTN: Blood pressure stable, on hydralazine and metoprolol History of second-degree AV block:  Telemetry with second-degree type I SB cardiology monitoring for device needs, continue on beta-blocker for now. HOCM: Suspecting LVOT obstruction and MR monitor heart rate and blood pressure.  Given patient's decreased oral intake hypokalemia, beta-blocker withdrawal and use of vasodilators for blood pressure control at risk of worsening since exacerbation of syncope. Prolonged QT interval: Monitor  T2DM: Hemoglobin is stable at 6.2.  Blood sugar is well controlled on sliding scale insulin. Recent Labs  Lab 05/28/21 1154 05/28/21 1701 05/28/21 2054 05/29/21 0743 05/29/21 1149  GLUCAP 125* 136* 128* 132* 125*     Mixed hyperlipidemia controlled LDL, continue Crestor  Recent fall/deconditioning/debility: PT OT to continue, will need skilled nursing facility upon discharge  Malnutrition of moderate degree in the context of chronic illness augment diet as tolerated  Right shoulder pain from recent fall and history of retrocochlear: Continue K pad, local analgesics x-ray of the shoulder no fracture.  GOC: Currently full code, prognosis remains to be seen, may need palliative care eval pending further neurology work-up  Right shoulder pain from recent fall and history of rotator cuff tear.  Continue K pad local analgesic cream Tylenol.  X-ray of the right shoulder without any fracture.  Continue physical therapy.   Moderate protein calorie malnutrition.  Present on admission.  Dietary on board.  Continue nutritional supplements, will get a speech therapy evaluation again since patient is still n.p.o.   DVT prophylaxis: scd-Lovenox on hold for LP.  Consider resuming 10/27 psot OP if okay w/ IR. Code Status:   Code Status: Full Code Family Communication: plan of care discussed with patient at bedside. Status is: Inpatient Remains inpatient appropriate because: Ongoing management of patient's acute encephalopathy  Objective: Vitals last 24 hrs: Vitals:   05/28/21 1347 05/28/21 2229  05/29/21 0214 05/29/21 0614  BP: (!) 160/94 (!) 144/77 (!) 146/73 (!) 156/86  Pulse: (!) 138 (!) 109 (!) 108 88  Resp: 20 17 17 16   Temp: 98.6 F (37 C) 97.9 F (36.6 C) 97.7 F (36.5 C) 98 F (36.7 C)  TempSrc:  Oral Oral Oral  SpO2: 96% 95% 95% 94%  Weight:      Height:       Weight change:   Intake/Output Summary (Last 24 hours) at 05/29/2021 0828 Last data filed at 05/29/2021 5366 Gross per 24 hour  Intake 2340.41 ml  Output 300 ml  Net 2040.41 ml   Net IO Since Admission: 2,690.75 mL [05/29/21 0828]   Physical Examination: General exam: Aa0x2, on RA, not in distress HEENT:Oral mucosa moist, Ear/Nose WNL grossly,dentition normal. Respiratory system: B/l clear BS, no use of accessory muscle, non tender. Cardiovascular system: S1 & S2 +,No JVD. Gastrointestinal system: Abdomen soft, NT,ND, BS+. Nervous System:Alert, awake, moving side well, unable to move left side Extremities: edema none, distal peripheral pulses palpable.  Skin: No rashes, no icterus. MSK: Normal muscle bulk, tone, power.  Medications reviewed:  Scheduled Meds:  amLODipine  10 mg Oral Daily   Chlorhexidine Gluconate Cloth  6 each Topical Daily   diclofenac Sodium  2 g Topical QID   feeding supplement (KATE FARMS STANDARD 1.4)  325 mL Oral BID BM   hydrALAZINE  100 mg Oral Q8H   insulin aspart  0-9 Units Subcutaneous TID WC   metoprolol tartrate  5 mg Intravenous Q8H   multivitamin with minerals  1  tablet Oral Daily   pantoprazole (PROTONIX) IV  40 mg Intravenous Q12H   rosuvastatin  20 mg Oral Daily   sodium chloride flush  3 mL Intravenous Q12H   thiamine injection  100 mg Intravenous Daily   Or   thiamine  100 mg Oral Daily   Continuous Infusions:  lactated ringers 100 mL/hr at 05/29/21 0327   Diet Order     None       Nutrition Problem: Moderate Malnutrition Etiology: chronic illness Signs/Symptoms: moderate fat depletion, moderate muscle depletion, severe muscle  depletion Interventions: MVI, Other (Comment) [employer])  Weight change:   Wt Readings from Last 3 Encounters:  05/23/21 61.4 kg  05/16/21 62.6 kg  05/10/21 62.6 kg     Consultants:see note  Procedures:see note Antimicrobials: Anti-infectives (From admission, onward)    Start     Dose/Rate Route Frequency Ordered Stop   05/23/21 2200  ceFEPIme (MAXIPIME) 2 g in sodium chloride 0.9 % 100 mL IVPB  Status:  Discontinued        2 g 200 mL/hr over 30 Minutes Intravenous Every 24 hours 05/23/21 0008 05/26/21 1119   05/23/21 0900  metroNIDAZOLE (FLAGYL) IVPB 500 mg  Status:  Discontinued        500 mg 100 mL/hr over 60 Minutes Intravenous Every 12 hours 05/22/21 2349 05/24/21 1151   05/22/21 2130  ceFEPIme (MAXIPIME) 2 g in sodium chloride 0.9 % 100 mL IVPB        2 g 200 mL/hr over 30 Minutes Intravenous  Once 05/22/21 2126 05/23/21 0038   05/22/21 2130  metroNIDAZOLE (FLAGYL) IVPB 500 mg        500 mg 100 mL/hr over 60 Minutes Intravenous  Once 05/22/21 2126 05/23/21 0039   05/22/21 2130  vancomycin (VANCOCIN) IVPB 1000 mg/200 mL premix  Status:  Discontinued        1,000 mg 200 mL/hr over 60 Minutes Intravenous  Once 05/22/21 2126 05/22/21 2129   05/22/21 2130  vancomycin (VANCOREADY) IVPB 1250 mg/250 mL        1,250 mg 166.7 mL/hr over 90 Minutes Intravenous  Once 05/22/21 2129 05/23/21 0039      Culture/Microbiology    Component Value Date/Time   SDES  05/23/2021 0030    IN/OUT CATH URINE Performed at [facility], [place] [address] [place], [place] [address]    SPECREQUEST  05/23/2021 0030    NONE Performed at [facility], [place] [address], [name], [place] [address]    CULT  05/23/2021 0030    NO GROWTH Performed at [facility] [address], [place], [place] [address]    REPTSTATUS 05/24/2021 FINAL 05/23/2021 0030    Other culture-see note  Unresulted Labs (From admission, onward)     Start     Ordered    05/27/21 1340  Vitamin B1  ONCE - STAT,   STAT       Comments: Please obtain prior to administering any IV or PO thiamine   Question Answer Comment  Specimen collection method Lab=Lab collect   Release to patient Immediate      05/27/21 1339   05/27/21 1232  CSF cell count with differential collection tube #: 1  (CSF cell count with differential (x 2 tubes) panel)  Once,   R       Question Answer Comment  collection tube # 1   Are there also cytology or pathology orders on this specimen? No   Release  to patient Immediate      05/27/21 1231   05/27/21 1232  CSF cell count with differential collection tube #: 4  (CSF cell count with differential (x 2 tubes) panel)  Once,   R       Question Answer Comment  collection tube # 4   Are there also cytology or pathology orders on this specimen? No   Release to patient Immediate      05/27/21 1231   05/27/21 1232  Protein and glucose, CSF  Once,   R        05/27/21 1231   05/27/21 1232  CSF culture w Gram Stain  ONCE - STAT,   STAT       Question:  Are there also cytology or pathology orders on this specimen?  Answer:  No   05/27/21 1231   05/27/21 1232  HSV 1/2 PCR, CSF Cerebrospinal Fluid  Once,   R        05/27/21 1231   05/27/21 1232  Cryptococcal antigen, CSF  Once,   R       Question:  Release to patient  Answer:  Immediate   05/27/21 1231          Data Reviewed: I have personally reviewed following labs and imaging studies CBC: Recent Labs  Lab 05/22/21 2030 05/23/21 0448 05/25/21 0704 05/26/21 0454 05/27/21 0423 05/28/21 0456 05/29/21 0446  WBC 17.4*   < > 10.1 11.8* 13.9* 19.3* 16.4*  NEUTROABS 15.1*  --   --   --   --   --   --   HGB 16.1*   < > 13.7 14.6 15.2* 14.3 11.7*  HCT 48.6*   < > 41.4 43.8 46.0 43.7 35.8*  MCV 88.2   < > 88.8 87.1 88.5 89.2 89.5  PLT 391   < > 397 435* 476* 429* 340   < > = values in this interval not displayed.   Basic Metabolic Panel: Recent Labs  Lab 05/24/21 0426 05/25/21 0704  05/26/21 0454 05/27/21 0423 05/28/21 0456 05/29/21 0446  NA 132* 133* 130* 134* 137 135  K 3.2* 4.0 3.8 4.2 3.9 3.2*  CL 106 105 103 104 107 109  CO2 15* 18* 16* 13* 14* 16*  GLUCOSE 98 104* 120* 140* 140* 125*  BUN 29* 25* 26* 40* 55* 56*  CREATININE 1.57* 1.52* 1.55* 2.14* 2.61* 2.38*  CALCIUM 9.1 9.5 9.8 10.1 9.8 8.7*  MG 2.0 2.1 2.2 2.7*  --  2.4   GFR: Estimated Creatinine Clearance: 15.8 mL/min (A) (by C-G formula based on SCr of 2.38 mg/dL (H)). Liver Function Tests: Recent Labs  Lab 05/22/21 2030 05/23/21 0448  AST 19 17  ALT 17 13  ALKPHOS 72 54  BILITOT 0.8 0.8  PROT 8.8* 6.9  ALBUMIN 5.0 3.8   Recent Labs  Lab 05/22/21 2030  LIPASE 26   Recent Labs  Lab 05/27/21 1445  AMMONIA 24   Coagulation Profile: Recent Labs  Lab 05/22/21 2158 05/23/21 0448  INR 1.0 1.1   Cardiac Enzymes: No results for input(s): CKTOTAL, CKMB, CKMBINDEX, TROPONINI in the last 168 hours. BNP (last 3 results) No results for input(s): PROBNP in the last 8760 hours. HbA1C: Recent Labs    05/28/21 0456  HGBA1C 6.2*   CBG: Recent Labs  Lab 05/28/21 0756 05/28/21 1154 05/28/21 1701 05/28/21 2054 05/29/21 0743  GLUCAP 142* 125* 136* 128* 132*   Lipid Profile: Recent Labs    05/27/21 1645  CHOL  139  HDL 74  LDLCALC 47  TRIG 89  CHOLHDL 1.9   Thyroid Function Tests: Recent Labs    05/28/21 0456  TSH 1.081  FREET4 1.42*   Anemia Panel: Recent Labs    05/27/21 1445  FOLATE 19.6   Sepsis Labs: Recent Labs  Lab 05/22/21 2030 05/22/21 2158  LATICACIDVEN 2.0* 1.7    Recent Results (from the past 240 hour(s))  Culture, Urine     Status: None   Collection Time: 05/22/21 12:18 PM   Specimen: Urine   UR  Result Value Ref Range Status   Urine Culture, Routine Final report  Final   Organism ID, Bacteria Comment  Final    Comment: Culture shows less than 10,000 colony forming units of bacteria per milliliter of urine. This colony count is not  generally considered to be clinically significant.   Blood Culture (routine x 2)     Status: None   Collection Time: 05/22/21  9:58 PM   Specimen: Right Antecubital; Blood  Result Value Ref Range Status   Specimen Description   Final    RIGHT ANTECUBITAL Performed at [facility] [address], [place], [place] [address]    Special Requests   Final    BOTTLES DRAWN AEROBIC AND ANAEROBIC Blood Culture results may not be optimal due to an inadequate volume of blood received in culture bottles Performed at [facility] [address], [place], [place] [address]    Culture   Final    NO GROWTH 5 DAYS Performed at [facility], [place] [address], [place], [place] [address]    Report Status 05/28/2021 FINAL  Final  Blood Culture (routine x 2)     Status: None   Collection Time: 05/22/21  9:59 PM   Specimen: BLOOD LEFT HAND  Result Value Ref Range Status   Specimen Description   Final    BLOOD LEFT HAND Performed at [facility] [address] [place], [place] [address]    Special Requests   Final    BOTTLES DRAWN AEROBIC ONLY Blood Culture results may not be optimal due to an inadequate volume of blood received in culture bottles Performed at [facility] [address], [place], [place] [address]    Culture   Final    NO GROWTH 5 DAYS Performed at [facility], [place] [address] [place], [place] [address]    Report Status 05/28/2021 FINAL  Final  Urine Culture     Status: None   Collection Time: 05/23/21 12:30 AM   Specimen: In/Out Cath Urine  Result Value Ref Range Status   Specimen Description   Final    IN/OUT CATH URINE Performed at [facility] [address] [place], [place] [address]    Special Requests   Final    NONE Performed at [facility], [place] [address], [place], [place] [address]    Culture   Final    NO GROWTH Performed at [facility], [place] [address], [place], [place] [address]    Report Status 05/24/2021 FINAL  Final  Surgical pcr screen     Status: Abnormal   Collection Time: 05/23/21 12:49 AM   Specimen: Nasal Mucosa; Nasal Swab  Result Value Ref Range Status   MRSA, PCR NEGATIVE NEGATIVE Final   Staphylococcus aureus POSITIVE (A) NEGATIVE Final    Comment: (NOTE) The Xpert SA Assay (FDA approved for NASAL  specimens in patients 57 years of age and older), is one component of a comprehensive surveillance program. It is not intended to diagnose infection nor to guide or monitor treatment. Performed at [facility], [place] [address], [place], [place] [address]   Resp Panel by RT-PCR (Flu A&B, Covid) Nasal Mucosa     Status: None   Collection Time: 05/23/21 12:49 AM   Specimen: Nasal Mucosa; Nasopharyngeal(NP) swabs in vial transport medium  Result Value Ref Range Status   SARS Coronavirus 2 by RT PCR NEGATIVE NEGATIVE Final    Comment: (NOTE) SARS-CoV-2 target nucleic acids are NOT DETECTED.  The SARS-CoV-2 RNA is generally detectable in upper respiratory specimens during the acute phase of infection. The lowest concentration of SARS-CoV-2 viral copies this assay can detect is 138 copies/mL. A negative result does not preclude SARS-Cov-2 infection and should not be used as the sole basis for treatment or other patient management decisions. A negative result may occur with  improper specimen collection/handling, submission of specimen other than nasopharyngeal swab, presence of viral mutation(s) within the areas targeted by this assay, and inadequate number of viral copies(<138 copies/mL). A negative result must be combined with clinical observations, patient history, and epidemiological information. The expected result is Negative.  Fact Sheet for Patients:  EntrepreneurPulse.com.au  Fact Sheet for Healthcare Providers:   IncredibleEmployment.be  This test is no t yet approved or cleared by the [place] FDA and  has been authorized for detection and/or diagnosis of SARS-CoV-2 by FDA under an Emergency Use Authorization (EUA). This EUA will remain  in effect (meaning this test can be used) for the duration of the COVID-19 declaration under Section 564(b)(1) of the Act, 21 U.S.C.section 360bbb-3(b)(1), unless the authorization is terminated  or revoked sooner.       Influenza A by PCR NEGATIVE NEGATIVE Final   Influenza B by PCR NEGATIVE NEGATIVE Final    Comment: (NOTE) The Xpert Xpress SARS-CoV-2/FLU/RSV plus assay is intended as an aid in the diagnosis of influenza from Nasopharyngeal swab specimens and should not be used as a sole basis for treatment. Nasal washings and aspirates are unacceptable for Xpert Xpress SARS-CoV-2/FLU/RSV testing.  Fact Sheet for Patients: EntrepreneurPulse.com.au  Fact Sheet for Healthcare Providers: IncredibleEmployment.be  This test is not yet approved or cleared by the [place] FDA and has been authorized for detection and/or diagnosis of SARS-CoV-2 by FDA under an Emergency Use Authorization (EUA). This EUA will remain in effect (meaning this test can be used) for the duration of the COVID-19 declaration under Section 564(b)(1) of the Act, 21 U.S.C. section 360bbb-3(b)(1), unless the authorization is terminated or revoked.  Performed at [facility], [place] [address], [place], [place] [address]      Radiology Studies: MR ANGIO HEAD WO CONTRAST  Result Date: 05/27/2021 CLINICAL DATA:  Initial evaluation for neuro deficit, stroke suspected. EXAM: MRA HEAD WITHOUT CONTRAST TECHNIQUE: Angiographic images of the Circle of Willis were acquired using MRA technique without intravenous contrast. COMPARISON:  MRI from earlier the same day. FINDINGS: Anterior circulation: Visualized  distal cervical segments of the internal carotid arteries are patent with antegrade flow. Petrous and cavernous segments patent bilaterally. Atheromatous change seen involving the supraclinoid segments of both internal carotid arteries bilaterally. Associated up to approximate moderate stenosis, left worse than right. A1 segments patent bilaterally, with the left being slightly hypoplastic. Normal anterior communicating artery complex. Both ACAs patent to their distal aspects without flow-limiting stenosis. M1 segments patent bilaterally. Normal  MCA bifurcations. Distal MCA branches demonstrate diffuse small vessel atheromatous irregularity but are well perfused without proximal branch occlusion. Posterior circulation: Visualized vertebral arteries are somewhat dolichoectatic and tortuous without stenosis. Right PICA origin widely patent. Left PICA origin not definitely visualized. Basilar somewhat dolichoectatic as well, most pronounced proximally. Basilar widely patent without stenosis. Superior cerebellar arteries are patent bilaterally. Both PCAs primarily supplied via the basilar. Moderate atheromatous change throughout the right PCA with associated moderate multifocal P2/P3 stenoses. Additional mild-to-moderate atheromatous change noted within the left PCAs well without high-grade stenosis. Anatomic variants: None significant. Other: No intracranial aneurysm. IMPRESSION: 1. Negative intracranial MRA for large vessel occlusion. 2. Moderate intracranial atherosclerotic disease involving the supraclinoid ICAs and PCAs with associated moderate multifocal stenoses as above. 3. Dolichoectatic vertebrobasilar system, suggesting chronic underlying hypertension. Electronically Signed   By: Jeannine Boga M.D.   On: 05/27/2021 23:37   MR BRAIN WO CONTRAST  Result Date: 05/27/2021 CLINICAL DATA:  Mental status change, unknown cause. Additional history provided: Mental status change, possible sepsis, recent  fall. EXAM: MRI HEAD WITHOUT CONTRAST TECHNIQUE: Multiplanar, multiecho pulse sequences of the brain and surrounding structures were obtained without intravenous contrast. COMPARISON:  Head CT 05/26/2021. FINDINGS: Brain: Mild-to-moderate cerebral atrophy. Comparatively mild cerebellar atrophy. 2 mm acute/early subacute infarct with posterior right frontal lobe white matter (series 5, image 91). 3 mm acute/early subacute infarct within the callosal splenium just to the right of midline (versus artifact) (for instance as seen on series 5, image 83). Advanced multifocal T2 FLAIR hyperintense signal abnormality within the cerebral white matter, nonspecific but compatible with chronic small vessel ischemic disease. Chronic small vessel ischemic changes are also present within the bilateral basal ganglia, thalami and pons. Chronic lacunar infarcts within the right thalamus and bilateral cerebellar hemispheres. Supratentorial and infratentorial chronic microhemorrhages, most notably within the bilateral thalami, within the brainstem and within the bilateral cerebellar hemispheres. These likely reflect sequela of chronic hypertensive microangiopathy. No evidence of an intracranial mass. No extra-axial fluid collection. No midline shift. Vascular: Maintained flow voids within the proximal large arterial vessels. Skull and upper cervical spine: No focal suspicious marrow lesion. Incompletely assessed cervical spondylosis. Mild C3-C4 grade 1 anterolisthesis. Sinuses/Orbits: Visualized orbits show no acute finding. Bilateral lens replacements. Mild mucosal thickening within the right frontoethmoidal recess. Moderate fluid level within the left sphenoid sinus. IMPRESSION: 2 mm acute/early subacute infarct within the posterior right frontal lobe white matter. An additional 3 mm acute/early subacute infarct is questioned within the callosal splenium (versus artifact). Advanced chronic small vessel ischemic disease within the  cerebral white matter. Chronic small vessel ischemic changes are also present within the bilateral basal ganglia, thalami and pons. Chronic lacunar infarcts within the right thalamus and bilateral cerebellar hemispheres. Supratentorial and infratentorial chronic microhemorrhages, most notably within the thalami, brainstem and cerebellar hemispheres. These findings likely reflect sequela of chronic hypertensive microangiopathy. Mild-to-moderate generalized cerebral atrophy. Comparatively mild cerebellar atrophy. Incompletely assessed cervical spondylosis. Mild C3-C4 grade 1 anterolisthesis. Paranasal sinus disease, as described. Electronically Signed   By: Kellie Simmering D.O.   On: 05/27/2021 11:22   MR CERVICAL SPINE WO CONTRAST  Result Date: 05/27/2021 CLINICAL DATA:  Initial evaluation for myelopathy, acute or progressive by epidural abscess. EXAM: MRI CERVICAL SPINE WITHOUT CONTRAST TECHNIQUE: Multiplanar, multisequence MR imaging of the cervical spine was performed. No intravenous contrast was administered. COMPARISON:  CT from 03/31/2021. FINDINGS: Alignment: Straightening of the normal cervical lordosis. Associated stepwise grade 1 anterolisthesis of C3 on C4, C4 on C5, C7  on T1, and T2 on T3, likely chronic and facet mediated. Vertebrae: Vertebral body height maintained without acute or chronic fracture. Bone marrow signal intensity within normal limits. No discrete or worrisome osseous lesions. No findings to suggest osteomyelitis discitis or septic arthritis. Cord: Normal signal and morphology. No cord signal changes to suggest myelopathy. No epidural abscess or other collection. Posterior Fossa, vertebral arteries, paraspinal tissues: Chronic microvascular ischemic changes noted within the visualized pons/brainstem. Craniocervical junction normal. Paraspinous and prevertebral soft tissues within normal limits. Normal flow voids seen within the vertebral arteries bilaterally. Disc levels: C2-C3: Mild  disc bulge. Mild left greater than right facet hypertrophy. No canal or foraminal stenosis. C3-C4: Grade 1 anterolisthesis with intervertebral disc space narrowing and diffuse disc bulge. Right paracentral disc protrusion indents the right ventral thecal sac, contacting the ventral cord (series 9, image 8). Minimal cord flattening without cord signal changes. Superimposed left-sided uncovertebral and facet hypertrophy with resultant moderate left C4 foraminal stenosis. Right neural foramina remains patent. C4-C5: Degenerative intervertebral disc space narrowing with grade 1 anterolisthesis. Broad-based left paracentral disc osteophyte complex indents the ventral thecal sac, contacting and minimally flattening the ventral cord. No cord signal changes. Mild spinal stenosis. Superimposed mild right-sided facet degeneration. No significant foraminal encroachment. C5-C6: Advanced degenerative intervertebral disc space narrowing with diffuse disc osteophyte complex. Broad posterior component flattens and partially faces the ventral thecal sac with resultant mild spinal stenosis, asymmetric to the left. Mild cord flattening without cord signal changes. Mild left greater than right C6 foraminal narrowing. C6-C7: Degenerative intervertebral disc space narrowing with diffuse disc osteophyte complex. Broad posterior component flattens and partially effaces the ventral thecal sac with resultant mild spinal stenosis. Borderline mild left C7 foraminal narrowing. Right neural foramina remains patent. C7-T1: Trace anterolisthesis. Mild disc bulge with uncovertebral spurring. Mild facet and ligament flavum hypertrophy. No spinal stenosis. Mild left C8 foraminal narrowing. Right neural foramen remains patent. Visualized upper thoracic spine demonstrates no significant finding. IMPRESSION: 1. No acute abnormality within the cervical spine. No evidence for acute infection or epidural abscess. 2. Normal MRI appearance of the cervical  spinal cord. No evidence for myelopathy. 3. Multilevel cervical spondylosis with resultant mild spinal stenosis at C3-4 through C6-7. Associated moderate left C4 foraminal stenosis, with mild bilateral C6 and left C7 foraminal narrowing. Electronically Signed   By: Jeannine Boga M.D.   On: 05/27/2021 23:46   US RENAL  Result Date: 05/28/2021 CLINICAL DATA:  Acute renal injury EXAM: RENAL / URINARY TRACT ULTRASOUND COMPLETE COMPARISON:  CT scan 05/22/2021 FINDINGS: Right Kidney: Renal measurements: 8.3 x 4.3 x 4.0 cm = volume: 76 mL. 1.6 cm cyst upper pole. Increased echogenicity. No hydronephrosis. Left Kidney: Renal measurements: 7.3 x 4.0 x 4.1 cm = volume: 63 mL. 2.7 cm cyst noted lower pole. 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 Increased echogenicity. No hydronephrosis. Bladder: Appears normal for degree of bladder distention. Other: None. IMPRESSION: 1. No hydronephrosis. 2. Increased renal parenchymal echogenicity consistent with medical renal disease. Electronically Signed   By: Misty Stanley M.D.   On: 05/28/2021 14:27   EEG adult  Result Date: 05/27/2021 Lora Havens, MD     05/27/2021  4:07 PM Patient Name: Anna Hoffman MRN: [id number] Epilepsy Attending: Lora Havens Referring Physician/Provider: Dr Donnetta Simpers Date: 05/27/2021 Duration: 22.48 mins Patient history: 86 year old female with altered mental status.  EEG to evaluate for seizures. Level of alertness: Awake AEDs during EEG study: None  Technical aspects: This EEG study was done with scalp electrodes positioned according to the 10-20 International system of electrode placement. Electrical activity was acquired at a sampling rate of 500Hz  and reviewed with a high frequency filter of 70Hz  and a low frequency filter of 1Hz . EEG data were recorded continuously and digitally stored. Description: No posterior dominant rhythm was seen. EEG showed continuous generalized 3  to 6 Hz theta-delta slowing. Generalized periodic discharges with triphasic morphology at  1Hz  were also noted, more prominent when awake/stimulated. Hyperventilation and photic stimulation were not performed.   ABNORMALITY - Periodic discharges with triphasic morphology, generalized ( GPDs) - Continuous slow, generalized IMPRESSION: This study showed generalized periodic discharges with triphasic morphology at  1Hz  which is on the ictal-interictal continuum. However, the morphology, frequency and reactivity to stimulation is more commonly seen due to toxic-metabolic causes like cefepime toxicity, hyerammonemia, renal failure. Additionally there is moderate to severe diffuse encephalopathy, nonspecific etiology. No seizures were seen throughout the recording. Priyanka O Yadav   VAS US CAROTID  Result Date: 05/28/2021 Carotid Arterial Duplex Study Patient Name:  Anna Hoffman  Date of Exam:   05/28/2021 Medical Rec #: [id number]        Accession #:    [id number] Date of Birth: 09-Nov-1934         Patient Gender: F Patient Age:   68 years Exam Location:  [facility] Procedure:      VAS US CAROTID Referring Phys: Alferd [facility] --------------------------------------------------------------------------------  Indications:       CVA. Risk Factors:      Hyperlipidemia, Diabetes. Limitations        Today's exam was limited due to the patient's respiratory                    variation and patient positioning, patient immobility. Comparison Study:  04/01/2021 - Right Carotid: Velocities in the right ICA are                    consistent with a 1-39% stenosis.                     Left Carotid: Velocities in the left ICA are consistent with                    a 1-39%                    stenosis.                     Vertebrals: Bilateral vertebral arteries demonstrate                    antegrade flow. Performing Technologist: Oliver Hum RVT  Examination Guidelines: A complete evaluation includes B-mode  imaging, spectral Doppler, color Doppler, and power Doppler as needed of all accessible portions of each vessel. Bilateral testing is considered an integral part of a complete examination. Limited examinations for reoccurring indications may be performed as noted.  Right Carotid Findings: +----------+--------+--------+--------+-----------------------+--------+           PSV cm/sEDV cm/sStenosisPlaque Description     Comments +----------+--------+--------+--------+-----------------------+--------+ CCA Prox  70      11              smooth and heterogenoustortuous +----------+--------+--------+--------+-----------------------+--------+ CCA Distal96      16              smooth and heterogenous         +----------+--------+--------+--------+-----------------------+--------+  ICA Prox  63      17              smooth and heterogenous         +----------+--------+--------+--------+-----------------------+--------+ ICA Distal49      12                                     tortuous +----------+--------+--------+--------+-----------------------+--------+ ECA       139     14                                              +----------+--------+--------+--------+-----------------------+--------+ +----------+--------+-------+--------+-------------------+           PSV cm/sEDV cmsDescribeArm Pressure (mmHG) +----------+--------+-------+--------+-------------------+ Subclavian120                                        +----------+--------+-------+--------+-------------------+ +---------+--------+--+--------+--+----------------------------+ VertebralPSV cm/s81EDV cm/s20Antegrade and High resistant +---------+--------+--+--------+--+----------------------------+  Left Carotid Findings: +----------+--------+--------+--------+-----------------------+--------+           PSV cm/sEDV cm/sStenosisPlaque Description     Comments  +----------+--------+--------+--------+-----------------------+--------+ CCA Prox  105     11              smooth and heterogenoustortuous +----------+--------+--------+--------+-----------------------+--------+ CCA Distal69      12              smooth and heterogenous         +----------+--------+--------+--------+-----------------------+--------+ ICA Prox  45      8               calcific                        +----------+--------+--------+--------+-----------------------+--------+ ICA Distal68      19                                     tortuous +----------+--------+--------+--------+-----------------------+--------+ ECA       70      4                                               +----------+--------+--------+--------+-----------------------+--------+ +----------+--------+--------+--------+-------------------+           PSV cm/sEDV cm/sDescribeArm Pressure (mmHG) +----------+--------+--------+--------+-------------------+ Subclavian115                                         +----------+--------+--------+--------+-------------------+ +---------+--------+--+--------+-+----------------------------+ VertebralPSV cm/s35EDV cm/s6Antegrade and High resistant +---------+--------+--+--------+-+----------------------------+   Summary: Right Carotid: Velocities in the right ICA are consistent with a 1-39% stenosis. Left Carotid: Velocities in the left ICA are consistent with a 1-39% stenosis. Vertebrals: Bilateral vertebral arteries demonstrate high resistant flow. *See table(s) above for measurements and observations.  Electronically signed by Harold Barban MD on 05/28/2021 at 9:15:34 PM.    Final      LOS: 6 days   Antonieta Pert, MD [facility]  05/29/2021, 8:28 AM

## 2021-05-29 NOTE — Procedures (Signed)
[  Altered mental status] EXAM: DIAGNOSTIC LUMBAR PUNCTURE UNDER FLUOROSCOPIC GUIDANCE FLUOROSCOPY TIME: Radiation Exposure Index (as provided by the fluoroscopic device): [11.9 mGy]   If the device does not provide the exposure index: Fluoroscopy Time (in minutes and seconds): [42 seconds] Number of Acquired Images: [1] PROCEDURE: Informed consent was obtained from the patient prior to the procedure, including potential complications of headache, allergy, and pain.  With the patient prone, the lower back was prepped with Betadine.  1% Lidocaine was used for local anesthesia.  Lumbar puncture was performed at the [L2-L3] level using a [20] gauge needle with return of [clear] CSF with an estimated opening pressure of [18] cm water.  [10] ml of CSF were obtained for laboratory studies.  The patient tolerated the procedure well and there were no apparent complications.   IMPRESSION: [Successful lumbar puncture.]

## 2021-05-29 NOTE — TOC Progression Note (Signed)
Transition of Care [facility]) - Progression Note    Patient Details  Name: Anna Hoffman MRN: [id number] Date of Birth: 12-22-1934  Transition of Care [facility] CM/SW Contact  Leeroy Cha, RN Phone Number: 05/29/2021, 8:46 AM  Clinical Narrative:    86 y.o. female with medical history significant of esophagitis, CKD stage IIIa, hypertension, colitis, hypertrophic cardiomyopathy and intracerebral hemorrhage, diabetes presented to hospital with nausea vomiting and weakness for 2 to 3 days with difficulty ambulating and confusion for few weeks.  She also had a fall with some trauma to the right shoulder.  In the ED, patient was mildly tachycardic and hypertensive.  Creatinine was elevated at 1.82 from baseline 1.2.  There was mild leukocytosis but hemoglobin was 16.1.  X-ray of chest, CT head scan and pelvic scan were negative for acute findings except for left renal angiomyolipoma.  Patient received vancomycin cefepime and Flagyl in the ED and was admitted to hospital after IV fluid bolus.     At this time, cultures have been negative and  antibiotics have been discontinued after completion of 3-day course of cefepime.  Patient continues to be weak and debilitated and PT has recommended skilled nursing facility placement.  Cardiology has seen the patient  for her history of intermittent heart block and question hypertrophic cardiomyopathy and has been adjusted on medications. On  05/26/2021, patient had accelerated hypertension and was less responsive.  Neurology was consulted.  Concern for stroke and cefepime induced neurotoxicity.  MRI showed minute stroke. TOC PLAN OF CARE: Following for toc needs and disposition possible cva, will follow for next steps.  Expected Discharge Plan:  (TBD) Barriers to Discharge: Continued Medical Work up  Expected Discharge Plan and Services Expected Discharge Plan:  (TBD)   Discharge Planning Services: CM Consult Post Acute Care Choice: [place] Living arrangements for the past 2 months: Single Family Home                                       Social Determinants of Health (SDOH) Interventions    Readmission Risk Interventions No flowsheet data found.

## 2021-05-29 NOTE — Progress Notes (Signed)
Occupational Therapy Treatment Patient Details Name: Anna Hoffman MRN: [id number] DOB: 08-09-1934 Today's Date: 05/29/2021   History of present illness Pt is a 86 y.o. female presented to hospital with nausea vomiting and weakness for 2 to 3 days with difficulty ambulating and confusion for few weeks. admitted for SIRS (systemic inflammatory response syndrome). She also had a fall with some trauma to the right shoulder for which she was receiving HHPT 2x/wk. PMH significant for esophagitis, CKD stage IIIa, hypertension, colitis, hypertrophic cardiomyopathy and intracerebral hemorrhage, diabetes.   OT comments  Pt participated in grooming tasks with Max A at bed level this session. Pt able to comb R side of head with RUE, limited by shoulder pain with flexion and internal rotation when brushing back of head. Pt unable to use LUE for grooming tasks. Required physical assist to wash face with LUE. With decreased strength grossly in LUE requiring passive assist to forward reach to full ROM in supine. However, unable to assess accuracy of strength deficit or if due to decreased ability to follow commands, will continue to assess. Son present during session and educated to continue to cue her to perform simple movements with LUE to see if LUE function improves, son verbalized understanding of education. Pt continues to be limited by impaired cognition. Continuing to recommend SNF rehab post d/c, will continue to follow in the acute setting.    Recommendations for follow up therapy are one component of a multi-disciplinary discharge planning process, led by the attending physician.  Recommendations may be updated based on patient status, additional functional criteria and insurance authorization.    Follow Up Recommendations  Skilled nursing-short term rehab (<3 hours/day)    Assistance Recommended at Discharge    Equipment Recommendations  None recommended by OT    Recommendations for Other Services       Precautions / Restrictions Precautions Precautions: Fall Restrictions Weight Bearing Restrictions: No       Mobility Bed Mobility                    Transfers                         Balance                                           ADL either performed or assessed with clinical judgement   ADL Overall ADL's : Needs assistance/impaired     Grooming: Wash/dry face;Bed level;Brushing hair;Maximal assistance Grooming Details (indicate cue type and reason): Pt brushed hair with R hand on R side with min verbal cues, unable to use LUE to brush hair in back and on L side of head. required total A with hand over hand LUE to wash face.                                     Vision       Perception     Praxis      Cognition Arousal/Alertness: Awake/alert Behavior During Therapy: WFL for tasks assessed/performed Overall Cognitive Status: History of cognitive impairments - at baseline Area of Impairment: Following commands                       Following Commands: Follows  one step commands inconsistently;Follows one step commands with increased time       General Comments: pt alert but drowsy. Required increased time and Max verbal/tactile cues to follow simple commands.          Exercises     Shoulder Instructions       General Comments      Pertinent Vitals/ Pain       Pain Assessment: Faces Pain Location: Rt shoulder Pain Descriptors / Indicators: Grimacing Pain Intervention(s): Limited activity within patient's tolerance;Monitored during session  Home Living                                          Prior Functioning/Environment              Frequency  Min 2X/week        Progress Toward Goals  OT Goals(current goals can now be found in the care plan section)  Progress towards OT goals: OT to reassess next treatment  Acute Rehab OT Goals OT Goal Formulation:  With family Time For Goal Achievement: 06/07/21 Potential to Achieve Goals: Fair ADL Goals Pt Will Perform Grooming: with set-up;with supervision;sitting Pt Will Perform Upper Body Bathing: with set-up;with supervision;sitting Pt Will Transfer to Toilet: ambulating;regular height toilet;grab bars;with min guard assist Pt Will Perform Toileting - Clothing Manipulation and hygiene: with mod assist;sit to/from stand  Plan Discharge plan remains appropriate    Co-evaluation                 AM-PAC OT "6 Clicks" Daily Activity     Outcome Measure   Help from another person eating meals?: A Little Help from another person taking care of personal grooming?: A Lot Help from another person toileting, which includes using toliet, bedpan, or urinal?: Total Help from another person bathing (including washing, rinsing, drying)?: A Lot Help from another person to put on and taking off regular upper body clothing?: A Lot Help from another person to put on and taking off regular lower body clothing?: Total 6 Click Score: 11    End of Session    OT Visit Diagnosis: Unsteadiness on feet (R26.81);Muscle weakness (generalized) (M62.81);Pain   Activity Tolerance Other (comment) (limited by decreased command following)   Patient Left with call bell/phone within reach;with family/visitor present;with bed alarm set;in bed   Nurse Communication Mobility status        Time: 5681-2751 OT Time Calculation (min): 20 min  Charges: OT General Charges $OT Visit: 1 Visit OT Treatments $Self Care/Home Management : 8-22 mins  Jackquline Denmark, OTS Acute Rehab Office: [phone]   Anna Hoffman 05/29/2021, 1:43 PM

## 2021-05-29 NOTE — Progress Notes (Addendum)
Progress Note  Patient Name: Anna Hoffman Date of Encounter: 05/29/2021  [facility] Cardiologist: Donato Heinz, MD   Subjective   Murmur on exam. She is awake and answers questions. She is oriented to self and place.   Inpatient Medications    Scheduled Meds:  amLODipine  10 mg Oral Daily   Chlorhexidine Gluconate Cloth  6 each Topical Daily   diclofenac Sodium  2 g Topical QID   feeding supplement (KATE FARMS STANDARD 1.4)  325 mL Oral BID BM   hydrALAZINE  100 mg Oral Q8H   insulin aspart  0-9 Units Subcutaneous TID WC   metoprolol tartrate  5 mg Intravenous Q8H   multivitamin with minerals  1 tablet Oral Daily   pantoprazole (PROTONIX) IV  40 mg Intravenous Q12H   potassium chloride  40 mEq Oral Once   rosuvastatin  20 mg Oral Daily   sodium chloride flush  3 mL Intravenous Q12H   thiamine injection  100 mg Intravenous Daily   Or   thiamine  100 mg Oral Daily   Continuous Infusions:  lactated ringers 100 mL/hr at 05/29/21 0327   PRN Meds: acetaminophen **OR** acetaminophen, hydrALAZINE, Muscle Rub, ondansetron (ZOFRAN) IV, polyethylene glycol, polyvinyl alcohol   Vital Signs    Vitals:   05/28/21 1347 05/28/21 2229 05/29/21 0214 05/29/21 0614  BP: (!) 160/94 (!) 144/77 (!) 146/73 (!) 156/86  Pulse: (!) 138 (!) 109 (!) 108 88  Resp: 20 17 17 16   Temp: 98.6 F (37 C) 97.9 F (36.6 C) 97.7 F (36.5 C) 98 F (36.7 C)  TempSrc:  Oral Oral Oral  SpO2: 96% 95% 95% 94%  Weight:      Height:        Intake/Output Summary (Last 24 hours) at 05/29/2021 1239 Last data filed at 05/29/2021 0844 Gross per 24 hour  Intake 1847.96 ml  Output 300 ml  Net 1547.96 ml   Last 3 Weights 05/23/2021 05/16/2021 05/10/2021  Weight (lbs) 135 lb 5.8 oz 138 lb 138 lb  Weight (kg) 61.4 kg 62.596 kg 62.596 kg      Telemetry    Sinus rhythm with mobitz type 1 heart block (Wenckebach)  - Personally Reviewed  ECG    No new tracings - Personally  Reviewed  Physical Exam   GEN: elderly female in no acute distress.   Neck: No JVD Cardiac: irregular rhythm, regular rate, systolic murmur Respiratory: respirations unlabored, diminished in bases GI: Soft, nontender, non-distended  MS: No edema; No deformity. Neuro:  Nonfocal  Psych: Normal affect   Labs    High Sensitivity Troponin:   Recent Labs  Lab 05/09/21 1825  TROPONINIHS 4     Chemistry Recent Labs  Lab 05/22/21 2030 05/22/21 2042 05/23/21 0448 05/24/21 0426 05/26/21 0454 05/27/21 0423 05/28/21 0456 05/29/21 0446  NA 137  --  132*   < > 130* 134* 137 135  K 4.4  --  3.9   < > 3.8 4.2 3.9 3.2*  CL 109  --  108   < > 103 104 107 109  CO2 13*  --  13*   < > 16* 13* 14* 16*  GLUCOSE 150*  --  121*   < > 120* 140* 140* 125*  BUN 38*  --  33*   < > 26* 40* 55* 56*  CREATININE 1.86*  --  1.70*   < > 1.55* 2.14* 2.61* 2.38*  CALCIUM 10.5*  --  9.1   < >  9.8 10.1 9.8 8.7*  MG  --    < >  --    < > 2.2 2.7*  --  2.4  PROT 8.8*  --  6.9  --   --   --   --   --   ALBUMIN 5.0  --  3.8  --   --   --   --   --   AST 19  --  17  --   --   --   --   --   ALT 17  --  13  --   --   --   --   --   ALKPHOS 72  --  54  --   --   --   --   --   BILITOT 0.8  --  0.8  --   --   --   --   --   GFRNONAA 26*  --  29*   < > 33* 22* 18* 20*  ANIONGAP 15  --  11   < > 11 17* 16* 10   < > = values in this interval not displayed.    Lipids  Recent Labs  Lab 05/27/21 1645  CHOL 139  TRIG 89  HDL 74  LDLCALC 47  CHOLHDL 1.9    Hematology Recent Labs  Lab 05/27/21 0423 05/28/21 0456 05/29/21 0446  WBC 13.9* 19.3* 16.4*  RBC 5.20* 4.90 4.00  HGB 15.2* 14.3 11.7*  HCT 46.0 43.7 35.8*  MCV 88.5 89.2 89.5  MCH 29.2 29.2 29.3  MCHC 33.0 32.7 32.7  RDW 15.9* 16.0* 15.9*  PLT 476* 429* 340   Thyroid  Recent Labs  Lab 05/28/21 0456  TSH 1.081  FREET4 1.42*    BNPNo results for input(s): BNP, PROBNP in the last 168 hours.  DDimer No results for input(s): DDIMER in the  last 168 hours.   Radiology    MR ANGIO HEAD WO CONTRAST  Result Date: 05/27/2021 CLINICAL DATA:  Initial evaluation for neuro deficit, stroke suspected. EXAM: MRA HEAD WITHOUT CONTRAST TECHNIQUE: Angiographic images of the Circle of Willis were acquired using MRA technique without intravenous contrast. COMPARISON:  MRI from earlier the same day. FINDINGS: Anterior circulation: Visualized distal cervical segments of the internal carotid arteries are patent with antegrade flow. Petrous and cavernous segments patent bilaterally. Atheromatous change seen involving the supraclinoid segments of both internal carotid arteries bilaterally. Associated up to approximate moderate stenosis, left worse than right. A1 segments patent bilaterally, with the left being slightly hypoplastic. Normal anterior communicating artery complex. Both ACAs patent to their distal aspects without flow-limiting stenosis. M1 segments patent bilaterally. Normal MCA bifurcations. Distal MCA branches demonstrate diffuse small vessel atheromatous irregularity but are well perfused without proximal branch occlusion. Posterior circulation: Visualized vertebral arteries are somewhat dolichoectatic and tortuous without stenosis. Right PICA origin widely patent. Left PICA origin not definitely visualized. Basilar somewhat dolichoectatic as well, most pronounced proximally. Basilar widely patent without stenosis. Superior cerebellar arteries are patent bilaterally. Both PCAs primarily supplied via the basilar. Moderate atheromatous change throughout the right PCA with associated moderate multifocal P2/P3 stenoses. Additional mild-to-moderate atheromatous change noted within the left PCAs well without high-grade stenosis. Anatomic variants: None significant. Other: No intracranial aneurysm. IMPRESSION: 1. Negative intracranial MRA for large vessel occlusion. 2. Moderate intracranial atherosclerotic disease involving the supraclinoid ICAs and PCAs  with associated moderate multifocal stenoses as above. 3. Dolichoectatic vertebrobasilar system, suggesting chronic underlying hypertension. Electronically Signed   By:  Jeannine Boga M.D.   On: 05/27/2021 23:37   MR CERVICAL SPINE WO CONTRAST  Result Date: 05/27/2021 CLINICAL DATA:  Initial evaluation for myelopathy, acute or progressive by epidural abscess. EXAM: MRI CERVICAL SPINE WITHOUT CONTRAST TECHNIQUE: Multiplanar, multisequence MR imaging of the cervical spine was performed. No intravenous contrast was administered. COMPARISON:  CT from 03/31/2021. FINDINGS: Alignment: Straightening of the normal cervical lordosis. Associated stepwise grade 1 anterolisthesis of C3 on C4, C4 on C5, C7 on T1, and T2 on T3, likely chronic and facet mediated. Vertebrae: Vertebral body height maintained without acute or chronic fracture. Bone marrow signal intensity within normal limits. No discrete or worrisome osseous lesions. No findings to suggest osteomyelitis discitis or septic arthritis. Cord: Normal signal and morphology. No cord signal changes to suggest myelopathy. No epidural abscess or other collection. Posterior Fossa, vertebral arteries, paraspinal tissues: Chronic microvascular ischemic changes noted within the visualized pons/brainstem. Craniocervical junction normal. Paraspinous and prevertebral soft tissues within normal limits. Normal flow voids seen within the vertebral arteries bilaterally. Disc levels: C2-C3: Mild disc bulge. Mild left greater than right facet hypertrophy. No canal or foraminal stenosis. C3-C4: Grade 1 anterolisthesis with intervertebral disc space narrowing and diffuse disc bulge. Right paracentral disc protrusion indents the right ventral thecal sac, contacting the ventral cord (series 9, image 8). Minimal cord flattening without cord signal changes. Superimposed left-sided uncovertebral and facet hypertrophy with resultant moderate left C4 foraminal stenosis. Right neural  foramina remains patent. C4-C5: Degenerative intervertebral disc space narrowing with grade 1 anterolisthesis. Broad-based left paracentral disc osteophyte complex indents the ventral thecal sac, contacting and minimally flattening the ventral cord. No cord signal changes. Mild spinal stenosis. Superimposed mild right-sided facet degeneration. No significant foraminal encroachment. C5-C6: Advanced degenerative intervertebral disc space narrowing with diffuse disc osteophyte complex. Broad posterior component flattens and partially faces the ventral thecal sac with resultant mild spinal stenosis, asymmetric to the left. Mild cord flattening without cord signal changes. Mild left greater than right C6 foraminal narrowing. C6-C7: Degenerative intervertebral disc space narrowing with diffuse disc osteophyte complex. Broad posterior component flattens and partially effaces the ventral thecal sac with resultant mild spinal stenosis. Borderline mild left C7 foraminal narrowing. Right neural foramina remains patent. C7-T1: Trace anterolisthesis. Mild disc bulge with uncovertebral spurring. Mild facet and ligament flavum hypertrophy. No spinal stenosis. Mild left C8 foraminal narrowing. Right neural foramen remains patent. Visualized upper thoracic spine demonstrates no significant finding. IMPRESSION: 1. No acute abnormality within the cervical spine. No evidence for acute infection or epidural abscess. 2. Normal MRI appearance of the cervical spinal cord. No evidence for myelopathy. 3. Multilevel cervical spondylosis with resultant mild spinal stenosis at C3-4 through C6-7. Associated moderate left C4 foraminal stenosis, with mild bilateral C6 and left C7 foraminal narrowing. Electronically Signed   By: Jeannine Boga M.D.   On: 05/27/2021 23:46   US RENAL  Result Date: 05/28/2021 CLINICAL DATA:  Acute renal injury EXAM: RENAL / URINARY TRACT ULTRASOUND COMPLETE COMPARISON:  CT scan 05/22/2021 FINDINGS: Right  Kidney: Renal measurements: 8.3 x 4.3 x 4.0 cm = volume: 76 mL. 1.6 cm cyst upper pole. Increased echogenicity. No hydronephrosis. Left Kidney: Renal measurements: 7.3 x 4.0 x 4.1 cm = volume: 63 mL. 2.7 cm cyst noted lower pole. 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 Increased echogenicity. No hydronephrosis. Bladder: Appears normal for degree of bladder distention. Other: None. IMPRESSION: 1. No hydronephrosis. 2. Increased renal parenchymal echogenicity consistent with medical  renal disease. Electronically Signed   By: Misty Stanley M.D.   On: 05/28/2021 14:27   EEG adult  Result Date: 05/27/2021 Lora Havens, MD     05/27/2021  4:07 PM Patient Name: Anna Hoffman MRN: [id number] Epilepsy Attending: Lora Havens Referring Physician/Provider: Dr Donnetta Simpers Date: 05/27/2021 Duration: 22.48 mins Patient history: 86 year old female with altered mental status.  EEG to evaluate for seizures. Level of alertness: Awake AEDs during EEG study: None Technical aspects: This EEG study was done with scalp electrodes positioned according to the 10-20 International system of electrode placement. Electrical activity was acquired at a sampling rate of 500Hz  and reviewed with a high frequency filter of 70Hz  and a low frequency filter of 1Hz . EEG data were recorded continuously and digitally stored. Description: No posterior dominant rhythm was seen. EEG showed continuous generalized 3 to 6 Hz theta-delta slowing. Generalized periodic discharges with triphasic morphology at  1Hz  were also noted, more prominent when awake/stimulated. Hyperventilation and photic stimulation were not performed.   ABNORMALITY - Periodic discharges with triphasic morphology, generalized ( GPDs) - Continuous slow, generalized IMPRESSION: This study showed generalized periodic discharges with triphasic morphology at  1Hz  which is on the ictal-interictal continuum. However, the  morphology, frequency and reactivity to stimulation is more commonly seen due to toxic-metabolic causes like cefepime toxicity, hyerammonemia, renal failure. Additionally there is moderate to severe diffuse encephalopathy, nonspecific etiology. No seizures were seen throughout the recording. Priyanka O Yadav   VAS US CAROTID  Result Date: 05/28/2021 Carotid Arterial Duplex Study Patient Name:  MCKENZIE BOVE Towson  Date of Exam:   05/28/2021 Medical Rec #: [id number]        Accession #:    [id number] Date of Birth: 1935/02/11         Patient Gender: F Patient Age:   41 years Exam Location:  [facility] Procedure:      VAS US CAROTID Referring Phys: [facility] --------------------------------------------------------------------------------  Indications:       CVA. Risk Factors:      Hyperlipidemia, Diabetes. Limitations        Today's exam was limited due to the patient's respiratory                    variation and patient positioning, patient immobility. Comparison Study:  04/01/2021 - Right Carotid: Velocities in the right ICA are                    consistent with a 1-39% stenosis.                     Left Carotid: Velocities in the left ICA are consistent with                    a 1-39%                    stenosis.                     Vertebrals: Bilateral vertebral arteries demonstrate                    antegrade flow. Performing Technologist: Oliver Hum RVT  Examination Guidelines: A complete evaluation includes B-mode imaging, spectral Doppler, color Doppler, and power Doppler as needed of all accessible portions of each vessel. Bilateral testing is considered an integral part of a complete examination. Limited examinations for reoccurring indications may be performed  as noted.  Right Carotid Findings: +----------+--------+--------+--------+-----------------------+--------+           PSV cm/sEDV cm/sStenosisPlaque Description     Comments  +----------+--------+--------+--------+-----------------------+--------+ CCA Prox  70      11              smooth and heterogenoustortuous +----------+--------+--------+--------+-----------------------+--------+ CCA Distal96      16              smooth and heterogenous         +----------+--------+--------+--------+-----------------------+--------+ ICA Prox  63      17              smooth and heterogenous         +----------+--------+--------+--------+-----------------------+--------+ ICA Distal49      12                                     tortuous +----------+--------+--------+--------+-----------------------+--------+ ECA       139     14                                              +----------+--------+--------+--------+-----------------------+--------+ +----------+--------+-------+--------+-------------------+           PSV cm/sEDV cmsDescribeArm Pressure (mmHG) +----------+--------+-------+--------+-------------------+ Subclavian120                                        +----------+--------+-------+--------+-------------------+ +---------+--------+--+--------+--+----------------------------+ VertebralPSV cm/s81EDV cm/s20Antegrade and High resistant +---------+--------+--+--------+--+----------------------------+  Left Carotid Findings: +----------+--------+--------+--------+-----------------------+--------+           PSV cm/sEDV cm/sStenosisPlaque Description     Comments +----------+--------+--------+--------+-----------------------+--------+ CCA Prox  105     11              smooth and heterogenoustortuous +----------+--------+--------+--------+-----------------------+--------+ CCA Distal69      12              smooth and heterogenous         +----------+--------+--------+--------+-----------------------+--------+ ICA Prox  45      8               calcific                         +----------+--------+--------+--------+-----------------------+--------+ ICA Distal68      19                                     tortuous +----------+--------+--------+--------+-----------------------+--------+ ECA       70      4                                               +----------+--------+--------+--------+-----------------------+--------+ +----------+--------+--------+--------+-------------------+           PSV cm/sEDV cm/sDescribeArm Pressure (mmHG) +----------+--------+--------+--------+-------------------+ [id number]                                         +----------+--------+--------+--------+-------------------+ +---------+--------+--+--------+-+----------------------------+ VertebralPSV cm/s35EDV cm/s6Antegrade and High  resistant +---------+--------+--+--------+-+----------------------------+   Summary: Right Carotid: Velocities in the right ICA are consistent with a 1-39% stenosis. Left Carotid: Velocities in the left ICA are consistent with a 1-39% stenosis. Vertebrals: Bilateral vertebral arteries demonstrate high resistant flow. *See table(s) above for measurements and observations.  Electronically signed by Harold Barban MD on 05/28/2021 at 9:15:34 PM.    Final     Cardiac Studies   Echo 04/01/21: 1. Left ventricular ejection fraction, by estimation, is 65 to 70%. The  left ventricle has hyperdynamic function. The left ventricle has no  regional wall motion abnormalities. There is severe focal basal septal  left ventricular hypertrophy, no LV  outflow tract gradient. Left ventricular diastolic parameters are  consistent with Grade I diastolic dysfunction (impaired relaxation). The  average left ventricular global longitudinal strain is -20.7 %. The global  longitudinal strain is normal.   2. Right ventricular systolic function is normal. The right ventricular  size is normal. There is normal pulmonary artery systolic pressure. The  estimated  right ventricular systolic pressure is 00.8 mmHg.   3. Left atrial size was mildly dilated.   4. The mitral valve is normal in structure. Trivial mitral valve  regurgitation. No evidence of mitral stenosis.   5. The aortic valve is tricuspid. Aortic valve regurgitation is mild.  Mild aortic valve sclerosis is present, with no evidence of aortic valve  stenosis.   6. Aortic dilatation noted. There is mild dilatation of the ascending  aorta, measuring 41 mm.   7. The inferior vena cava is normal in size with greater than 50%  respiratory variability, suggesting right atrial pressure of 3 mmHg.   Patient Profile     86 y.o. female with hypertrophic cardiomyopathy, PSVT, prior 2:1 AV block, syncope 03/2021 (recurrent 1 month later), esophagitis, CKD stage 3a, HTN, ascending aortic dilation, prior intracerebral hemorrhage 2017 and ischemic CVA, prolonged QT, diabetes.    Dr. Lovena Le recommended PPM insertion and the patient wished to wait. He recommended to wean off her beta blocker. More recently notes indicate a plan to move forward with PPM implantation. She had recent right shoulder injury.    She presented with nausea, vomiting, weakness, and worsening lethargy/confusion with SIRS criteria of unclear etiology. Over the course of several days, lab workup has demonstrated AKI on CKD, hyponatremia, leukocytosis. MRi shows two small subacute strokes on R.  Today, awake oriented x 2, answering questions  Assessment & Plan    SIRS Encephalopathy - imaging and labs thusfar negative - LP was delayed due to anticoagulation - ?TEE for endocarditis - blood cultures remain negative - will discuss with attending - primary is considering cefepime induced toxicity   Sinus tachycardia - confirmed with carotid sinus compression yesterday - suspect his will resolve as SIRS resolves, but may worsen HCM - telemetry with HR improved to the 90s - continue IV lopressor every 8 hrs - can convert to 12.5 mg  BID metoprolol tartrate when able to take PO   Hx of second degree AV block - telemetry with second degree type 1 HB - will continue to monitor for device needs - continue BB for now   HCM - suspect murmur on exam likely due to LVOT obstruction and MR - hemodynamics will worsen with hypovolemia (not eating/drinking), beta blocker withdrawal, use of vasodilators (for BP control), all of which are oprating here. This places her at risk for CHF exacerbation or syncope duee to obstruction. - BP 140-150s, HR improved to 80-90s - can consider  increasing lopressor to q6 hrs if needed   AoCKD stage IIIa - sCr baseline 1.2 - sCr has trended to 2.38, K 3.2 - gently replace - CO2 16 (14) - renal US consistent with renal disease      For questions or updates, please contact [facility] Please consult [email or web address] for contact info under        Signed, Ledora Bottcher, PA  05/29/2021, 12:39 PM   I have seen and examined the patient along with Fabian Sharp, PA.  I have reviewed the chart, notes and new data.  I agree with PA/NP's note.  Key new complaints: Remarkable improvement in level of alertness.  She is oriented to her location and the year and recognizes her son, but is clearly still confused.  She is however communicating and is coherent. Key examination changes: Reduce intensity of the mid systolic murmurs heard at the left lower sternal border and at the apex Key new findings / data: Slight improvement in renal function.  WBC also improved.  Has occasional "dropped beats" due to second-degree AV block Mobitz type I on telemetry, but has not had any serious bradycardia or pauses.  PLAN: Cause of her encephalopathy remains uncertain and its unclear why she is improving, less the antibiotics are from a "kicking in" for an infection that we have not been able to localize. Continue beta-blockers and hydration to avoid worsening left ventricular outflow tract obstruction/MR that  can be seen with hypovolemia and tachycardia in patients with HCM.  Transition the beta-blockers to oral as soon as it is safe to give her p.o. medications.  Sanda Klein, MD, [place] [phone] 05/29/2021, 4:33 PM

## 2021-05-29 NOTE — Progress Notes (Addendum)
SLP Cancellation Note  Patient Details Name: Anna Hoffman MRN: [id number] DOB: [birth year]   Cancelled treatment:       Reason Eval/Treat Not Completed: Other (comment) (pt had LP done and must stay flat for a period of time, will continue efforts)  Per SLP notes yesterday, it was advised pt have dys1/nectar when fully alert *able to sit upright* and requesting po. Will follow up 05/30/21 am for diagnostic treatment.  Communicated with RN, Anna Hoffman, via secure chat and tube swallow precaution sign to the floor for pt's room.   Anna Lime, MS [facility] SLP Acute Rehab Services Office [phone] Pager [phone]  Anna Hoffman 05/29/2021, 2:45 PM

## 2021-05-30 DIAGNOSIS — N179 Acute kidney failure, unspecified: Secondary | ICD-10-CM | POA: Diagnosis not present

## 2021-05-30 DIAGNOSIS — I10 Essential (primary) hypertension: Secondary | ICD-10-CM | POA: Diagnosis not present

## 2021-05-30 DIAGNOSIS — R6510 Systemic inflammatory response syndrome (SIRS) of non-infectious origin without acute organ dysfunction: Secondary | ICD-10-CM | POA: Diagnosis not present

## 2021-05-30 DIAGNOSIS — G9341 Metabolic encephalopathy: Secondary | ICD-10-CM

## 2021-05-30 DIAGNOSIS — E860 Dehydration: Secondary | ICD-10-CM | POA: Diagnosis not present

## 2021-05-30 DIAGNOSIS — Z8673 Personal history of transient ischemic attack (TIA), and cerebral infarction without residual deficits: Secondary | ICD-10-CM | POA: Diagnosis not present

## 2021-05-30 LAB — GLUCOSE, CAPILLARY
Glucose-Capillary: 114 mg/dL — ABNORMAL HIGH (ref 70–99)
Glucose-Capillary: 120 mg/dL — ABNORMAL HIGH (ref 70–99)
Glucose-Capillary: 108 mg/dL — ABNORMAL HIGH (ref 70–99)

## 2021-05-30 LAB — BASIC METABOLIC PANEL
Anion gap: 8 (ref 5–15)
BUN: 48 mg/dL — ABNORMAL HIGH (ref 8–23)
CO2: 20 mmol/L — ABNORMAL LOW (ref 22–32)
Calcium: 8.8 mg/dL — ABNORMAL LOW (ref 8.9–10.3)
Chloride: 110 mmol/L (ref 98–111)
Creatinine, Ser: 2.18 mg/dL — ABNORMAL HIGH (ref 0.44–1.00)
GFR, Estimated: 22 mL/min — ABNORMAL LOW (ref >60)
Glucose, Bld: 102 mg/dL — ABNORMAL HIGH (ref 70–99)
Potassium: 3.3 mmol/L — ABNORMAL LOW (ref 3.5–5.1)
Sodium: 138 mmol/L (ref 135–145)

## 2021-05-30 LAB — CBC WITH DIFFERENTIAL/PLATELET
Abs Immature Granulocytes: 0.07 10*3/uL (ref 0.00–0.07)
Basophils Absolute: 0 10*3/uL (ref 0.0–0.1)
Basophils Relative: 0 %
Eosinophils Absolute: 0.2 10*3/uL (ref 0.0–0.5)
Eosinophils Relative: 2 %
HCT: 35.1 % — ABNORMAL LOW (ref 36.0–46.0)
Hemoglobin: 10.8 g/dL — ABNORMAL LOW (ref 12.0–15.0)
Immature Granulocytes: 1 %
Lymphocytes Relative: 11 %
Lymphs Abs: 1.5 10*3/uL (ref 0.7–4.0)
MCH: 28.9 pg (ref 26.0–34.0)
MCHC: 30.8 g/dL (ref 30.0–36.0)
MCV: 93.9 fL (ref 80.0–100.0)
Monocytes Absolute: 1.5 10*3/uL — ABNORMAL HIGH (ref 0.1–1.0)
Monocytes Relative: 11 %
Neutro Abs: 10.7 10*3/uL — ABNORMAL HIGH (ref 1.7–7.7)
Neutrophils Relative %: 75 %
Platelets: 293 10*3/uL (ref 150–400)
RBC: 3.74 MIL/uL — ABNORMAL LOW (ref 3.87–5.11)
RDW: 15.8 % — ABNORMAL HIGH (ref 11.5–15.5)
WBC: 14.1 10*3/uL — ABNORMAL HIGH (ref 4.0–10.5)
nRBC: 0 % (ref 0.0–0.2)

## 2021-05-30 LAB — PROCALCITONIN: Procalcitonin: 0.27 ng/mL

## 2021-05-30 LAB — VITAMIN B1: Vitamin B1 (Thiamine): 209.9 nmol/L — ABNORMAL HIGH (ref 66.5–200.0)

## 2021-05-30 MED ORDER — METOPROLOL TARTRATE 25 MG PO TABS
12.5000 mg | ORAL_TABLET | Freq: Two times a day (BID) | ORAL | Status: DC
  Administered 2021-05-30 – 2021-06-06 (×16): 12.5 mg via ORAL
  Filled 2021-05-30 (×17): qty 1

## 2021-05-30 MED ORDER — PANTOPRAZOLE SODIUM 40 MG PO TBEC
40.0000 mg | DELAYED_RELEASE_TABLET | Freq: Two times a day (BID) | ORAL | Status: DC
  Administered 2021-05-30 – 2021-06-06 (×15): 40 mg via ORAL
  Filled 2021-05-30 (×15): qty 1

## 2021-05-30 MED ORDER — ENOXAPARIN SODIUM 30 MG/0.3ML IJ SOSY
30.0000 mg | PREFILLED_SYRINGE | INTRAMUSCULAR | Status: DC
  Administered 2021-05-30 – 2021-06-06 (×8): 30 mg via SUBCUTANEOUS
  Filled 2021-05-30 (×8): qty 0.3

## 2021-05-30 MED ORDER — ASPIRIN 81 MG PO CHEW
81.0000 mg | CHEWABLE_TABLET | Freq: Every day | ORAL | Status: DC
  Administered 2021-05-30 – 2021-06-06 (×8): 81 mg via ORAL
  Filled 2021-05-30 (×8): qty 1

## 2021-05-30 MED ORDER — SODIUM CHLORIDE 0.9 % IV SOLN: INTRAVENOUS | Status: DC

## 2021-05-30 NOTE — Progress Notes (Signed)
PROGRESS NOTE    Anna Hoffman  ELT:[id number] DOB: 18-Feb-1935 DOA: 05/22/2021 PCP: Glendale Chard, MD    Chief Complaint  Patient presents with   Emesis    Brief Narrative:  Anna Hoffman, 86 y.o. female with PMH of esophagitis, CKD stage III, HTN, colitis, hypertrophic cardiomyopathy and intracerebral hemorrhage, diabetes   presented to hospital with nausea vomiting and weakness for 2 to 3 days with difficulty ambulating and confusion for few weeks.  She also had a fall with some trauma to the right shoulder.  In the ED, patient was mildly tachycardic and hypertensive.  Creatinine was elevated at 1.82 from baseline 1.2.  There was mild leukocytosis but hemoglobin was 16.1.  X-ray of chest, CT head scan and pelvic scan were negative for acute findings except for left renal angiomyolipoma.  Patient received vancomycin cefepime and Flagyl in the ED and was admitted to hospital after IV fluid bolus.     At this time, cultures have been negative and  antibiotics have been discontinued after completion of 3-day course of cefepime.  Patient continues to be weak and debilitated and PT has recommended skilled nursing facility placement.  Cardiology has seen the patient  for her history of intermittent heart block and question hypertrophic cardiomyopathy and has been adjusted on medications. On  05/26/2021, patient had accelerated hypertension and was less responsive.  Neurology was consulted.  Concern for stroke and cefepime induced neurotoxicity.  MRI showed minute stroke.   Assessment & Plan:   Principal Problem:   SIRS (systemic inflammatory response syndrome) ([name]) Active Problems:   Benign essential HTN   Type 2 diabetes mellitus with stage 3 chronic kidney disease, without long-term current use of insulin ([name])   Mixed hyperlipidemia   Acute renal failure superimposed on stage 3a chronic kidney disease ([name])   Prolonged QT interval   Dehydration   HOCM (hypertrophic obstructive  cardiomyopathy) ([place])   History of CVA (cerebrovascular accident)   Sepsis ([place])   Malnutrition of moderate degree  #1 SIRS -On admission patient met criteria for SIRS with tachycardia, lactic acidosis, leukocytosis with no obvious source. -CT abdomen and pelvis done with no acute findings. -COVID-19 PCR negative, MRSA PCR negative, blood cultures negative x5 days. -Patient initially placed empirically on broad-spectrum antibiotics and received approximately 3 days worse which was subsequently discontinued due to concern for cefepime induced neurotoxicity and encephalopathy. -Currently being monitored off antibiotics. -Leukocytosis trending down. -Status post LP with gram stain negative for organisms, findings do not correspond with an acute bacterial meningitis. -Continue to monitor off antibiotics.  2.  Acute metabolic encephalopathy -Questionable etiology. -May be secondary to acute/early subacute infarct and sinus tachycardia with HOCM. -Patient alert and oriented to self place and time, somewhat drowsy. -Continue gentle hydration. -Supportive care.  3.  Acute/early subacute infarct -MRI brain with 2 mm subacute/acute infarct on posterior right frontal lobe and 3 mm in the callosal splenium. -Patient noted to have weakness on the left upper and lower extremities. -Patient underwent MRA angiogram head and neck with no large vessel occlusion, moderate intracranial atherosclerotic disease. -Carotid Dopplers with no significant ICA stenosis, vertebral arteries with high resistant flow. -Patient with recent 2D echo done 04/01/2021 and as such not repeated. -Hemoglobin A1c 6.2. -Fasting lipid panel with LDL of 47. -Continue Crestor, amlodipine. -Per neurology.  4.  Acute kidney injury on chronic kidney disease stage IIIa -Baseline creatinine 1.2 peaked at 2.3. -Renal function improving with gentle hydration. -Continue IV fluids.  5.  Hypokalemia -Replete.  6.  Metabolic  acidosis -Improving with hydration. -Bicarb of 20. -Follow.  7.  Sinus tachycardia -Was on IV Lopressor however transition to oral Lopressor per cardiology today. -If patient oral intake worsens again will need to be placed back on IV Lopressor. -Per cardiology.  8.  Hypertension -Continue hydralazine, metoprolol.  9.  History of second-degree AV block/first-degree AV block -Patient being followed by cardiology, continue beta-blocker.  10.HOCM -Being followed by cardiology who suspect LVOT obstruction and MR noted on examination. -Per cardiology avoid dehydration. -Continue beta-blocker, IV fluids. -Appreciate cardiology input and recommendations.  11.  Diabetes mellitus type 2 -SSI.  12.  Hyperlipidemia -Continue statin.  67.  Recent fall/deconditioning/debility -Being followed by PT/OT who are recommending SNF placement. -TOC consulted for placement.  14.  Moderate protein calorie malnutrition -Nutritional supplementation.  15.  Right shoulder pain -Secondary to recent fall and history of retrocochlear. -Plain films of the right shoulder with no acute fracture. -Continue K pad, local analgesics.    DVT prophylaxis: SCDs.  Lovenox held for LP.  Resume Lovenox today 05/30/2021.   Code Status: Full Family Communication: No family at bedside.  Updated patient. Disposition:   Status is: Inpatient  Remains inpatient appropriate because: Ongoing management of patient's cardiac issues and encephalopathy.       Consultants:  Neurology: Dr.Khaliqdina 05/27/2021 Cardiology: Dr. Johnsie Cancel 05/24/2021 Interventional radiology  Procedures:  Lumbar puncture under fluoroscopy, per IR, Dr. Ilda Foil 05/29/2021 CT abdomen and pelvis 05/22/2021 CT head without contrast 05/26/2021, 05/22/2021 Chest x-ray 05/22/2021 MRI C-spine 05/27/2021 MRI angiogram head 05/27/2021 MRI brain 05/27/2021 Renal ultrasound 05/28/2021 Carotid ultrasound 05/28/2021 EEG  05/27/2021     Antimicrobials:  IV cefepime 05/22/2021>>>>> 05/26/2021  IV Flagyl 05/22/2021>>>>> 05/24/2021 IV vancomycin 05/22/2021>>>>> 05/23/2021    Subjective: Patient sleeping but arousable.  Somewhat drowsy.  Alert to self place and time.  No chest pain.  No abdominal pain.  No dysuria.  Answering questions appropriately.  Started this morning dysphagia 1 diet.  Objective: Vitals:   05/29/21 2108 05/29/21 2339 05/30/21 0405 05/30/21 0625  BP: (!) 178/93 (!) 157/88 (!) 177/95 (!) 144/96  Pulse: (!) 103 (!) 108 (!) 101 85  Resp: '16  18 18  ' Temp: 99 F (37.2 C)  99.3 F (37.4 C)   TempSrc: Oral  Oral   SpO2: 95% 96% 99% 98%  Weight:      Height:        Intake/Output Summary (Last 24 hours) at 05/30/2021 1140 Last data filed at 05/30/2021 0424 Gross per 24 hour  Intake 744.34 ml  Output 1050 ml  Net -305.66 ml   Filed Weights   05/23/21 0237  Weight: 61.4 kg    Examination:  General exam: Appears calm and comfortable  Respiratory system: Clear to auscultation anterior lung fields.  No wheezes, no crackles, no rhonchi. Respiratory effort normal. Cardiovascular system: S1 & S2 heard, RRR. No JVD, murmurs, rubs, gallops or clicks. No pedal edema. Gastrointestinal system: Abdomen is nondistended, soft and nontender. No organomegaly or masses felt. Normal bowel sounds heard. Central nervous system: Alert and oriented x3.  Some left-sided weakness. Extremities: Symmetric 5 x 5 power. Skin: No rashes, lesions or ulcers Psychiatry: Judgement and insight appear fair. Mood & affect appropriate.     Data Reviewed: I have personally reviewed following labs and imaging studies  CBC: Recent Labs  Lab 05/26/21 0454 05/27/21 0423 05/28/21 0456 05/29/21 0446 05/30/21 0443  WBC 11.8* 13.9* 19.3* 16.4* 14.1*  NEUTROABS  --   --   --   --  10.7*  HGB 14.6 15.2* 14.3 11.7* 10.8*  HCT 43.8 46.0 43.7 35.8* 35.1*  MCV 87.1 88.5 89.2 89.5 93.9  PLT 435* 476* 429* 340  626    Basic Metabolic Panel: Recent Labs  Lab 05/24/21 0426 05/25/21 0704 05/26/21 0454 05/27/21 0423 05/28/21 0456 05/29/21 0446 05/30/21 0443  NA 132* 133* 130* 134* 137 135 138  K 3.2* 4.0 3.8 4.2 3.9 3.2* 3.3*  CL 106 105 103 104 107 109 110  CO2 15* 18* 16* 13* 14* 16* 20*  GLUCOSE 98 104* 120* 140* 140* 125* 102*  BUN 29* 25* 26* 40* 55* 56* 48*  CREATININE 1.57* 1.52* 1.55* 2.14* 2.61* 2.38* 2.18*  CALCIUM 9.1 9.5 9.8 10.1 9.8 8.7* 8.8*  MG 2.0 2.1 2.2 2.7*  --  2.4  --     GFR: Estimated Creatinine Clearance: 17.3 mL/min (A) (by C-G formula based on SCr of 2.18 mg/dL (H)).  Liver Function Tests: No results for input(s): AST, ALT, ALKPHOS, BILITOT, PROT, ALBUMIN in the last 168 hours.  CBG: Recent Labs  Lab 05/29/21 0743 05/29/21 1149 05/29/21 1627 05/29/21 2110 05/30/21 0747  GLUCAP 132* 125* 87 119* 114*     Recent Results (from the past 240 hour(s))  Culture, Urine     Status: None   Collection Time: 05/22/21 12:18 PM   Specimen: Urine   UR  Result Value Ref Range Status   Urine Culture, Routine Final report  Final   Organism ID, Bacteria Comment  Final    Comment: Culture shows less than 10,000 colony forming units of bacteria per milliliter of urine. This colony count is not generally considered to be clinically significant.   Blood Culture (routine x 2)     Status: None   Collection Time: 05/22/21  9:58 PM   Specimen: Right Antecubital; Blood  Result Value Ref Range Status   Specimen Description   Final    RIGHT ANTECUBITAL Performed at [facility] [address], [place], [place] [address]    Special Requests   Final    BOTTLES DRAWN AEROBIC AND ANAEROBIC Blood Culture results may not be optimal due to an inadequate volume of blood received in culture bottles Performed at [facility] [address], [place], [place] [address]    Culture   Final    NO GROWTH 5 DAYS Performed at [facility], [place] [address], [place], [place] [address]    Report Status 05/28/2021 FINAL  Final  Blood Culture (routine x 2)     Status: None   Collection Time: 05/22/21  9:59 PM   Specimen: BLOOD LEFT HAND  Result Value Ref Range Status   Specimen Description   Final    BLOOD LEFT HAND Performed at [facility] [address] [place], [place] [address]    Special Requests   Final    BOTTLES DRAWN AEROBIC ONLY Blood Culture results may not be optimal due to an inadequate volume of blood received in culture bottles Performed at [facility] [address] [place], [place] [address]    Culture   Final    NO GROWTH 5 DAYS Performed at [facility], [place] [address], [place], [place] [address]    Report Status 05/28/2021 FINAL  Final  Urine Culture     Status: None   Collection Time: 05/23/21 12:30 AM   Specimen: In/Out Cath Urine  Result Value Ref Range Status   Specimen  Description   Final    IN/OUT CATH URINE Performed at [facility] [address] [place], [place] [address]    Special Requests   Final    NONE Performed at [facility], [place] [address] [place], [place] [address]    Culture   Final    NO GROWTH Performed at [facility], [place] [address], [place], [place] [address]    Report Status 05/24/2021 FINAL  Final  Surgical pcr screen     Status: Abnormal   Collection Time: 05/23/21 12:49 AM   Specimen: Nasal Mucosa; Nasal Swab  Result Value Ref Range Status   MRSA, PCR NEGATIVE NEGATIVE Final   Staphylococcus aureus POSITIVE (A) NEGATIVE Final    Comment: (NOTE) The Xpert SA Assay (FDA approved for NASAL specimens in patients 37 years of age and older), is one component of a comprehensive surveillance program. It is not intended to diagnose infection nor to guide or monitor treatment. Performed at [facility], [place] [address], [place], [place] [address]    Resp Panel by RT-PCR (Flu A&B, Covid) Nasal Mucosa     Status: None   Collection Time: 05/23/21 12:49 AM   Specimen: Nasal Mucosa; Nasopharyngeal(NP) swabs in vial transport medium  Result Value Ref Range Status   SARS Coronavirus 2 by RT PCR NEGATIVE NEGATIVE Final    Comment: (NOTE) SARS-CoV-2 target nucleic acids are NOT DETECTED.  The SARS-CoV-2 RNA is generally detectable in upper respiratory specimens during the acute phase of infection. The lowest concentration of SARS-CoV-2 viral copies this assay can detect is 138 copies/mL. A negative result does not preclude SARS-Cov-2 infection and should not be used as the sole basis for treatment or other patient management decisions. A negative result may occur with  improper specimen collection/handling, submission of specimen other than nasopharyngeal swab, presence of viral mutation(s) within the areas targeted by this assay, and inadequate number of viral copies(<138 copies/mL). A negative result must be combined with clinical observations, patient history, and epidemiological information. The expected result is Negative.  Fact Sheet for Patients:  EntrepreneurPulse.com.au  Fact Sheet for Healthcare Providers:  IncredibleEmployment.be  This test is no t yet approved or cleared by the [place] FDA and  has been authorized for detection and/or diagnosis of SARS-CoV-2 by FDA under an Emergency Use Authorization (EUA). This EUA will remain  in effect (meaning this test can be used) for the duration of the COVID-19 declaration under Section 564(b)(1) of the Act, 21 U.S.C.section 360bbb-3(b)(1), unless the authorization is terminated  or revoked sooner.       Influenza A by PCR NEGATIVE NEGATIVE Final   Influenza B by PCR NEGATIVE NEGATIVE Final    Comment: (NOTE) The Xpert Xpress SARS-CoV-2/FLU/RSV plus assay is intended as an aid in the diagnosis of influenza from Nasopharyngeal swab  specimens and should not be used as a sole basis for treatment. Nasal washings and aspirates are unacceptable for Xpert Xpress SARS-CoV-2/FLU/RSV testing.  Fact Sheet for Patients: EntrepreneurPulse.com.au  Fact Sheet for Healthcare Providers: IncredibleEmployment.be  This test is not yet approved or cleared by the [place] FDA and has been authorized for detection and/or diagnosis of SARS-CoV-2 by FDA under an Emergency Use Authorization (EUA). This EUA will remain in effect (meaning this test can be used) for the duration of the COVID-19 declaration under Section 564(b)(1) of the Act, 21 U.S.C. section 360bbb-3(b)(1), unless the authorization is terminated or revoked.  Performed at [facility], [place] [address] [place], [place] [address]   CSF culture w Gram Stain     Status: None (Preliminary result)   Collection Time: 05/29/21  3:22 PM   Specimen: PATH Cytology CSF; Cerebrospinal Fluid  Result Value Ref Range Status   Specimen Description   Final    CSF Performed at [facility] [address] [place], [place] [address]    Special Requests   Final    NONE Performed at [facility], [place] [address], [place], [place] [address]    Gram Stain   Final    NO WBC SEEN NO ORGANISMS SEEN CYTOSPIN SMEAR Gram Stain Report Called to,Read Back By and Verified With: C.ACHUSIM, RN AT 2992 ON 10.26.22 BY N.Izekiel Flegel Performed at [facility] [place] [address], [place], [place] [address]    Culture   Final    NO GROWTH < 12 HOURS Performed at [place] [address], [place], [place] [address]    Report Status PENDING  Incomplete         Radiology Studies: US RENAL  Result Date: 05/28/2021 CLINICAL DATA:  Acute renal injury EXAM: RENAL / URINARY TRACT ULTRASOUND COMPLETE COMPARISON:  CT scan 05/22/2021 FINDINGS: Right Kidney: Renal measurements: 8.3 x  4.3 x 4.0 cm = volume: 76 mL. 1.6 cm cyst upper pole. Increased echogenicity. No hydronephrosis. Left Kidney: Renal measurements: 7.3 x 4.0 x 4.1 cm = volume: 63 mL. 2.7 cm cyst noted lower pole. 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 Increased echogenicity. No hydronephrosis. Bladder: Appears normal for degree of bladder distention. Other: None. IMPRESSION: 1. No hydronephrosis. 2. Increased renal parenchymal echogenicity consistent with medical renal disease. Electronically Signed   By: Misty Stanley M.D.   On: 05/28/2021 14:27   DG FLUORO GUIDE LUMBAR PUNCTURE  Result Date: 05/29/2021 CLINICAL DATA:  Altered mental status EXAM: DIAGNOSTIC LUMBAR PUNCTURE UNDER FLUOROSCOPIC GUIDANCE FLUOROSCOPY TIME:  Radiation Exposure Index (as provided by the fluoroscopic device): 11.9 mGy If the device does not provide the exposure index: Fluoroscopy Time (in minutes and seconds):  42 seconds Number of Acquired Images:  1 PROCEDURE: Informed consent was obtained from the patient prior to the procedure, including potential complications of headache, allergy, and pain. With the patient prone, the lower back was prepped with Betadine. 1% Lidocaine was used for local anesthesia. Lumbar puncture was performed at the L2-L3 level using a 20 gauge needle with return of clear CSF with an estimated opening pressure of 18 cm water. 10 ml of CSF were obtained for laboratory studies. The patient tolerated the procedure well and there were no apparent complications. IMPRESSION: Successful lumbar puncture. Electronically Signed   By: Suzy Bouchard M.D.   On: 05/29/2021 16:30        Scheduled Meds:  amLODipine  10 mg Oral Daily   Chlorhexidine Gluconate Cloth  6 each Topical Daily   diclofenac Sodium  2 g Topical QID   feeding supplement (KATE [employer]ARMS STANDARD 1.4)  325 mL Oral BID BM   hydrALAZINE  100 mg Oral Q8H   insulin aspart  0-9 Units Subcutaneous TID WC   metoprolol  tartrate  12.5 mg Oral BID   multivitamin with minerals  1 tablet Oral Daily   pantoprazole  40 mg Oral BID   rosuvastatin  20 mg Oral Daily   sodium chloride flush  3 mL Intravenous Q12H   thiamine  100  mg Oral Daily   Continuous Infusions:  lactated ringers 100 mL/hr at 05/30/21 0729     LOS: 7 days    Time spent: 35 minutes    Irine Seal, MD [facility]   To contact the attending provider between 7A-7P or the covering provider during after hours 7P-7A, please log into the web site [email or web address] and access using universal [place] password for that web site. If you do not have the password, please call the hospital operator.  05/30/2021, 11:40 AM

## 2021-05-30 NOTE — Progress Notes (Signed)

## 2021-05-30 NOTE — Progress Notes (Signed)
Physical Therapy Treatment Patient Details Name: Anna Hoffman MRN: [id number] DOB: Feb 14, 1935 Today's Date: 05/30/2021   History of Present Illness Pt is a 86 y.o. female presented to hospital with nausea vomiting and weakness for 2 to 3 days with difficulty ambulating and confusion for few weeks. admitted for SIRS (systemic inflammatory response syndrome). She also had a fall with some trauma to the right shoulder for which she was receiving HHPT 2x/wk. PMH significant for esophagitis, CKD stage IIIa, hypertension, colitis, hypertrophic cardiomyopathy and intracerebral hemorrhage, diabetes.    PT Comments    Pt more awake/alert however very little verbalizations during session.  Pt able to move/assist with R extremities however flaccid left extremities, and pt inattentive to left side despite multiple cues.  Pt appeared to fatigue quickly EOB so assisted back to supine and repositioned.      Recommendations for follow up therapy are one component of a multi-disciplinary discharge planning process, led by the attending physician.  Recommendations may be updated based on patient status, additional functional criteria and insurance authorization.  Follow Up Recommendations  Skilled nursing-short term rehab (<3 hours/day)     Assistance Recommended at Discharge    Equipment Recommendations  Wheelchair cushion (measurements PT);Wheelchair (measurements PT);Hospital bed    Recommendations for Other Services       Precautions / Restrictions Precautions Precautions: Fall Precaution Comments: left hemi ? flaccid left extremities 10/27     Mobility  Bed Mobility Overal bed mobility: Needs Assistance Bed Mobility: Supine to Sit;Sit to Supine     Supine to sit: Total assist;+2 for physical assistance Sit to supine: Total assist;+2 for physical assistance   General bed mobility comments: pt attempting to assisting with right extremities, no active movement on left side; utilized bed  pad for positioning, pt required total assist    Transfers                        Ambulation/Gait                 Stairs             Wheelchair Mobility    Modified Rankin (Stroke Patients Only)       Balance Overall balance assessment: Needs assistance Sitting-balance support: Feet supported;Single extremity supported Sitting balance-Leahy Scale: Zero Sitting balance - Comments: pt unable to maintain upright posture without physical assist                                    Cognition Arousal/Alertness: Awake/alert Behavior During Therapy: Flat affect Overall Cognitive Status: History of cognitive impairments - at baseline Area of Impairment: Following commands                       Following Commands: Follows one step commands inconsistently;Follows one step commands with increased time     Problem Solving: Requires verbal cues;Requires tactile cues;Difficulty sequencing General Comments: pt following commands mostly for Right side however occasionally inconsistent; pt did not actively move or attend to left side        Exercises      General Comments        Pertinent Vitals/Pain Pain Assessment: Faces Faces Pain Scale: No hurt Pain Intervention(s): Repositioned;Monitored during session    Home Living  Prior Function            PT Goals (current goals can now be found in the care plan section) Progress towards PT goals: Progressing toward goals    Frequency    Min 2X/week      PT Plan Current plan remains appropriate    Co-evaluation              AM-PAC PT "6 Clicks" Mobility   Outcome Measure  Help needed turning from your back to your side while in a flat bed without using bedrails?: Total Help needed moving from lying on your back to sitting on the side of a flat bed without using bedrails?: Total Help needed moving to and from a bed to a chair  (including a wheelchair)?: Total Help needed standing up from a chair using your arms (e.g., wheelchair or bedside chair)?: Total Help needed to walk in hospital room?: Total Help needed climbing 3-5 steps with a railing? : Total 6 Click Score: 6    End of Session   Activity Tolerance: Patient tolerated treatment well Patient left: in bed;with call bell/phone within reach Nurse Communication: Mobility status PT Visit Diagnosis: Muscle weakness (generalized) (M62.81);Other symptoms and signs involving the nervous system (R29.898)     Time: 7867-5449 PT Time Calculation (min) (ACUTE ONLY): 14 min  Charges:  $Therapeutic Activity: 8-22 mins                     Jannette Spanner PT, DPT Acute Rehabilitation Services Pager: [phone] Office: [place] 05/30/2021, 4:21 PM

## 2021-05-30 NOTE — Progress Notes (Signed)
SLP Cancellation Note  Patient Details Name: Anna Hoffman MRN: [id number] DOB: April 04, 1935   Cancelled treatment:       Reason Eval/Treat Not Completed: Fatigue/lethargy limiting ability to participate  Attempted to see pt to assess tolerance of current (Dys1/NTL) diet and determine if is pt ready to advance textures or needs to participate in instrumental study. Pt is currently sleeping soundly, and will rouse only briefly. This level of alertness is insufficient for safe PO intake. Will continue efforts.   Giovanie Lefebre B. Quentin Ore, [facility], [place] Speech Language Pathologist Office: [phone]  Shonna Chock 05/30/2021, 1:52 PM

## 2021-05-30 NOTE — Progress Notes (Addendum)
Progress Note  Patient Name: Anna Hoffman Date of Encounter: 05/30/2021  [facility] Cardiologist: Donato Heinz, MD   Subjective   Pt less responsive today, per RN - just waking up.   Inpatient Medications    Scheduled Meds:  amLODipine  10 mg Oral Daily   Chlorhexidine Gluconate Cloth  6 each Topical Daily   diclofenac Sodium  2 g Topical QID   feeding supplement (KATE FARMS STANDARD 1.4)  325 mL Oral BID BM   hydrALAZINE  100 mg Oral Q8H   insulin aspart  0-9 Units Subcutaneous TID WC   metoprolol tartrate  5 mg Intravenous Q8H   multivitamin with minerals  1 tablet Oral Daily   pantoprazole (PROTONIX) IV  40 mg Intravenous Q12H   potassium chloride  40 mEq Oral Once   rosuvastatin  20 mg Oral Daily   sodium chloride flush  3 mL Intravenous Q12H   thiamine injection  100 mg Intravenous Daily   Or   thiamine  100 mg Oral Daily   Continuous Infusions:  lactated ringers 100 mL/hr at 05/30/21 0729   PRN Meds: acetaminophen **OR** acetaminophen, hydrALAZINE, Muscle Rub, ondansetron (ZOFRAN) IV, polyethylene glycol, polyvinyl alcohol   Vital Signs    Vitals:   05/29/21 2108 05/29/21 2339 05/30/21 0405 05/30/21 0625  BP: (!) 178/93 (!) 157/88 (!) 177/95 (!) 144/96  Pulse: (!) 103 (!) 108 (!) 101 85  Resp: 16  18 18   Temp: 99 F (37.2 C)  99.3 F (37.4 C)   TempSrc: Oral  Oral   SpO2: 95% 96% 99% 98%  Weight:      Height:        Intake/Output Summary (Last 24 hours) at 05/30/2021 0909 Last data filed at 05/30/2021 0424 Gross per 24 hour  Intake 858.78 ml  Output 1050 ml  Net -191.22 ml   Last 3 Weights 05/23/2021 05/16/2021 05/10/2021  Weight (lbs) 135 lb 5.8 oz 138 lb 138 lb  Weight (kg) 61.4 kg 62.596 kg 62.596 kg      Telemetry    Sinus rhythm HR 80-100s - Personally Reviewed  ECG    No new tracings - Personally Reviewed  Physical Exam   GEN: No acute distress.   Neck: No JVD Cardiac: RRR,+ murmur, louder  laterally Respiratory: Clear to auscultation bilaterally. GI: Soft, nontender, non-distended  MS: No edema; No deformity. Neuro:  Nonfocal  Psych: Normal affect   Labs    High Sensitivity Troponin:   Recent Labs  Lab 05/09/21 1825  TROPONINIHS 4     Chemistry Recent Labs  Lab 05/26/21 0454 05/27/21 0423 05/28/21 0456 05/29/21 0446 05/30/21 0443  NA 130* 134* 137 135 138  K 3.8 4.2 3.9 3.2* 3.3*  CL 103 104 107 109 110  CO2 16* 13* 14* 16* 20*  GLUCOSE 120* 140* 140* 125* 102*  BUN 26* 40* 55* 56* 48*  CREATININE 1.55* 2.14* 2.61* 2.38* 2.18*  CALCIUM 9.8 10.1 9.8 8.7* 8.8*  MG 2.2 2.7*  --  2.4  --   GFRNONAA 33* 22* 18* 20* 22*  ANIONGAP 11 17* 16* 10 8    Lipids  Recent Labs  Lab 05/27/21 1645  CHOL 139  TRIG 89  HDL 74  LDLCALC 47  CHOLHDL 1.9    Hematology Recent Labs  Lab 05/28/21 0456 05/29/21 0446 05/30/21 0443  WBC 19.3* 16.4* 14.1*  RBC 4.90 4.00 3.74*  HGB 14.3 11.7* 10.8*  HCT 43.7 35.8* 35.1*  MCV 89.2  89.5 93.9  MCH 29.2 29.3 28.9  MCHC 32.7 32.7 30.8  RDW 16.0* 15.9* 15.8*  PLT 429* 340 293   Thyroid  Recent Labs  Lab 05/28/21 0456  TSH 1.081  FREET4 1.42*    BNPNo results for input(s): BNP, PROBNP in the last 168 hours.  DDimer No results for input(s): DDIMER in the last 168 hours.   Radiology    US RENAL  Result Date: 05/28/2021 CLINICAL DATA:  Acute renal injury EXAM: RENAL / URINARY TRACT ULTRASOUND COMPLETE COMPARISON:  CT scan 05/22/2021 FINDINGS: Right Kidney: Renal measurements: 8.3 x 4.3 x 4.0 cm = volume: 76 mL. 1.6 cm cyst upper pole. Increased echogenicity. No hydronephrosis. Left Kidney: Renal measurements: 7.3 x 4.0 x 4.1 cm = volume: 63 mL. 2.7 cm cyst noted lower pole. 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 Increased echogenicity. No hydronephrosis. Bladder: Appears normal for degree of bladder distention. Other: None. IMPRESSION: 1. No hydronephrosis. 2.  Increased renal parenchymal echogenicity consistent with medical renal disease. Electronically Signed   By: Misty Stanley M.D.   On: 05/28/2021 14:27   VAS US CAROTID  Result Date: 05/28/2021 Carotid Arterial Duplex Study Patient Name:  Anna Hoffman  Date of Exam:   05/28/2021 Medical Rec #: [id number]        Accession #:    [id number] Date of Birth: 1935-05-07         Patient Gender: F Patient Age:   86 years Exam Location:  [facility] Procedure:      VAS US CAROTID Referring Phys: Alferd Patee [facility] --------------------------------------------------------------------------------  Indications:       CVA. Risk Factors:      Hyperlipidemia, Diabetes. Limitations        Today's exam was limited due to the patient's respiratory                    variation and patient positioning, patient immobility. Comparison Study:  04/01/2021 - Right Carotid: Velocities in the right ICA are                    consistent with a 1-39% stenosis.                     Left Carotid: Velocities in the left ICA are consistent with                    a 1-39%                    stenosis.                     Vertebrals: Bilateral vertebral arteries demonstrate                    antegrade flow. Performing Technologist: Oliver Hum RVT  Examination Guidelines: A complete evaluation includes B-mode imaging, spectral Doppler, color Doppler, and power Doppler as needed of all accessible portions of each vessel. Bilateral testing is considered an integral part of a complete examination. Limited examinations for reoccurring indications may be performed as noted.  Right Carotid Findings: +----------+--------+--------+--------+-----------------------+--------+           PSV cm/sEDV cm/sStenosisPlaque Description     Comments +----------+--------+--------+--------+-----------------------+--------+ CCA Prox  70      11              smooth and heterogenoustortuous  +----------+--------+--------+--------+-----------------------+--------+ CCA Distal96  16              smooth and heterogenous         +----------+--------+--------+--------+-----------------------+--------+ ICA Prox  63      17              smooth and heterogenous         +----------+--------+--------+--------+-----------------------+--------+ ICA Distal49      12                                     tortuous +----------+--------+--------+--------+-----------------------+--------+ ECA       139     14                                              +----------+--------+--------+--------+-----------------------+--------+ +----------+--------+-------+--------+-------------------+           PSV cm/sEDV cmsDescribeArm Pressure (mmHG) +----------+--------+-------+--------+-------------------+ Subclavian120                                        +----------+--------+-------+--------+-------------------+ +---------+--------+--+--------+--+----------------------------+ VertebralPSV cm/s81EDV cm/s20Antegrade and High resistant +---------+--------+--+--------+--+----------------------------+  Left Carotid Findings: +----------+--------+--------+--------+-----------------------+--------+           PSV cm/sEDV cm/sStenosisPlaque Description     Comments +----------+--------+--------+--------+-----------------------+--------+ CCA Prox  105     11              smooth and heterogenoustortuous +----------+--------+--------+--------+-----------------------+--------+ CCA Distal69      12              smooth and heterogenous         +----------+--------+--------+--------+-----------------------+--------+ ICA Prox  45      8               calcific                        +----------+--------+--------+--------+-----------------------+--------+ ICA Distal68      19                                     tortuous  +----------+--------+--------+--------+-----------------------+--------+ ECA       70      4                                               +----------+--------+--------+--------+-----------------------+--------+ +----------+--------+--------+--------+-------------------+           PSV cm/sEDV cm/sDescribeArm Pressure (mmHG) +----------+--------+--------+--------+-------------------+ Subclavian115                                         +----------+--------+--------+--------+-------------------+ +---------+--------+--+--------+-+----------------------------+ VertebralPSV cm/s35EDV cm/s6Antegrade and High resistant +---------+--------+--+--------+-+----------------------------+   Summary: Right Carotid: Velocities in the right ICA are consistent with a 1-39% stenosis. Left Carotid: Velocities in the left ICA are consistent with a 1-39% stenosis. Vertebrals: Bilateral vertebral arteries demonstrate high resistant flow. *See table(s) above for measurements and observations.  Electronically signed by Harold Barban MD on 05/28/2021 at 9:15:34 PM.    Final  DG FLUORO GUIDE LUMBAR PUNCTURE  Result Date: 05/29/2021 CLINICAL DATA:  Altered mental status EXAM: DIAGNOSTIC LUMBAR PUNCTURE UNDER FLUOROSCOPIC GUIDANCE FLUOROSCOPY TIME:  Radiation Exposure Index (as provided by the fluoroscopic device): 11.9 mGy If the device does not provide the exposure index: Fluoroscopy Time (in minutes and seconds):  42 seconds Number of Acquired Images:  1 PROCEDURE: Informed consent was obtained from the patient prior to the procedure, including potential complications of headache, allergy, and pain. With the patient prone, the lower back was prepped with Betadine. 1% Lidocaine was used for local anesthesia. Lumbar puncture was performed at the L2-L3 level using a 20 gauge needle with return of clear CSF with an estimated opening pressure of 18 cm water. 10 ml of CSF were obtained for laboratory studies. The  patient tolerated the procedure well and there were no apparent complications. IMPRESSION: Successful lumbar puncture. Electronically Signed   By: Suzy Bouchard M.D.   On: 05/29/2021 16:30    Cardiac Studies   Echo 04/01/21: 1. Left ventricular ejection fraction, by estimation, is 65 to 70%. The  left ventricle has hyperdynamic function. The left ventricle has no  regional wall motion abnormalities. There is severe focal basal septal  left ventricular hypertrophy, no LV  outflow tract gradient. Left ventricular diastolic parameters are  consistent with Grade I diastolic dysfunction (impaired relaxation). The  average left ventricular global longitudinal strain is -20.7 %. The global  longitudinal strain is normal.   2. Right ventricular systolic function is normal. The right ventricular  size is normal. There is normal pulmonary artery systolic pressure. The  estimated right ventricular systolic pressure is 93.8 mmHg.   3. Left atrial size was mildly dilated.   4. The mitral valve is normal in structure. Trivial mitral valve  regurgitation. No evidence of mitral stenosis.   5. The aortic valve is tricuspid. Aortic valve regurgitation is mild.  Mild aortic valve sclerosis is present, with no evidence of aortic valve  stenosis.   6. Aortic dilatation noted. There is mild dilatation of the ascending  aorta, measuring 41 mm.   7. The inferior vena cava is normal in size with greater than 50%  respiratory variability, suggesting right atrial pressure of 3 mmHg  Patient Profile     86 y.o. female with hypertrophic cardiomyopathy, PSVT, prior 2:1 AV block, syncope 03/2021 (recurrent 1 month later), esophagitis, CKD stage 3a, HTN, ascending aortic dilation, prior intracerebral hemorrhage 2017 and ischemic CVA, prolonged QT, diabetes.    Dr. Lovena Le recommended PPM insertion and the patient wished to wait. He recommended to wean off her beta blocker. More recently notes indicate a plan to move  forward with PPM implantation. She had recent right shoulder injury.    She presented with nausea, vomiting, weakness, and worsening lethargy/confusion with SIRS criteria of unclear etiology. Over the course of several days, lab workup has demonstrated AKI on CKD, hyponatremia, leukocytosis. MRi shows two small subacute strokes on R.   Yesterday, awake oriented x 2, answering questions. Today, not yet awake, not answering questions.  Assessment & Plan    SIRS  Encephalopathy - imaging and labs thusfar negative - unclear etiology, primary has considered cefepime induced toxicity - LP yesterday with no WBC and no organisms seen - culture pending, although increased protein and glucose   Sinus tachycardia - confirmed with carotid sinus compression - ST could worsen HCM  - telemetry generally in the 90-100s - continue IV lopressor every 8 hr --> convert to 12.5 mg  metoprolol tartrate BID when safe to take PO   First degree AV block Second degree type 1 AV bloc - continues to have dropped beats - will not increase BB at this time   HCM - suspect LVOT obstruction and MR by murmur on exam - continue BB and IVF, avoid dehydration - note that she is on high dose hydralazine - SBP 140-170s, HR 80-100s   AoCKD stage IIIa - sCr 2.18, K 3.3 - baseline sCr 1.2 - renal US consistent with renal disease   Hypertension - on 10 mg amlodipine + 100 mg hydralazine TID      For questions or updates, please contact [facility] Please consult [email or web address] for contact info under        Signed, Ledora Bottcher, PA  05/30/2021, 9:09 AM     I have seen and examined the patient along with Ledora Bottcher, PA.  I have reviewed the chart, notes and new data.  I agree with PA/NP's note.  Key new complaints: little improvement neurologically, in fact a more lethargic again Key examination changes: RRR w occasional dropped beats (Wenckebach 2nd deg AVB on monitor), no evidence of  CHF clinically, soft 1/6 mid-systolic murmur Key new findings / data: improving BUN and creatinine, WBC lower. Bland CSF.  PLAN: Avoid hypovolemia. If unable to take PO meds, restart IV metoprolol 5 mg Q8h. Investigate cause of encephalopathy further.  Sanda Klein, MD, [place] [phone] 05/30/2021, 2:10 PM

## 2021-05-30 NOTE — Progress Notes (Signed)
Brief Neuro note:  LP not concerning for meningitis/encephalitis. Mentation improved after discontinuing Cefepime. I suspect that the noted Left sided weakness is residual from the noted remote lacunar R thalamic stroke on MRI. I do not think that the 23mm R frontal white matter stroke is causing any symptoms. I think this is a small vessel stroke and the stroke workup so far has been non revealing.  Will start Aspirin 81mg  daily for secondary stroke prevention and recommend follow up with stroke clinic outpatient.  We will signoff. Please feel free to contact us with any questions or concerns.  [place] Pager Number [phone]

## 2021-05-31 ENCOUNTER — Telehealth: Payer: Medicare Other

## 2021-05-31 DIAGNOSIS — N179 Acute kidney failure, unspecified: Secondary | ICD-10-CM | POA: Diagnosis not present

## 2021-05-31 DIAGNOSIS — I10 Essential (primary) hypertension: Secondary | ICD-10-CM | POA: Diagnosis not present

## 2021-05-31 DIAGNOSIS — Z8673 Personal history of transient ischemic attack (TIA), and cerebral infarction without residual deficits: Secondary | ICD-10-CM | POA: Diagnosis not present

## 2021-05-31 DIAGNOSIS — R6510 Systemic inflammatory response syndrome (SIRS) of non-infectious origin without acute organ dysfunction: Secondary | ICD-10-CM | POA: Diagnosis not present

## 2021-05-31 DIAGNOSIS — E860 Dehydration: Secondary | ICD-10-CM | POA: Diagnosis not present

## 2021-05-31 LAB — GLUCOSE, CAPILLARY
Glucose-Capillary: 111 mg/dL — ABNORMAL HIGH (ref 70–99)
Glucose-Capillary: 111 mg/dL — ABNORMAL HIGH (ref 70–99)
Glucose-Capillary: 134 mg/dL — ABNORMAL HIGH (ref 70–99)

## 2021-05-31 LAB — CBC WITH DIFFERENTIAL/PLATELET
Abs Immature Granulocytes: 0.1 10*3/uL — ABNORMAL HIGH (ref 0.00–0.07)
Basophils Absolute: 0 10*3/uL (ref 0.0–0.1)
Basophils Relative: 0 %
Eosinophils Absolute: 0.3 10*3/uL (ref 0.0–0.5)
Eosinophils Relative: 3 %
HCT: 33.3 % — ABNORMAL LOW (ref 36.0–46.0)
Hemoglobin: 10.5 g/dL — ABNORMAL LOW (ref 12.0–15.0)
Immature Granulocytes: 1 %
Lymphocytes Relative: 13 %
Lymphs Abs: 1.5 10*3/uL (ref 0.7–4.0)
MCH: 28.8 pg (ref 26.0–34.0)
MCHC: 31.5 g/dL (ref 30.0–36.0)
MCV: 91.5 fL (ref 80.0–100.0)
Monocytes Absolute: 1.2 10*3/uL — ABNORMAL HIGH (ref 0.1–1.0)
Monocytes Relative: 10 %
Neutro Abs: 8.4 10*3/uL — ABNORMAL HIGH (ref 1.7–7.7)
Neutrophils Relative %: 73 %
Platelets: 283 10*3/uL (ref 150–400)
RBC: 3.64 MIL/uL — ABNORMAL LOW (ref 3.87–5.11)
RDW: 15.3 % (ref 11.5–15.5)
WBC: 11.5 10*3/uL — ABNORMAL HIGH (ref 4.0–10.5)
nRBC: 0 % (ref 0.0–0.2)

## 2021-05-31 LAB — BASIC METABOLIC PANEL
Anion gap: 7 (ref 5–15)
BUN: 36 mg/dL — ABNORMAL HIGH (ref 8–23)
CO2: 22 mmol/L (ref 22–32)
Calcium: 8.7 mg/dL — ABNORMAL LOW (ref 8.9–10.3)
Chloride: 107 mmol/L (ref 98–111)
Creatinine, Ser: 1.78 mg/dL — ABNORMAL HIGH (ref 0.44–1.00)
GFR, Estimated: 28 mL/min — ABNORMAL LOW (ref >60)
Glucose, Bld: 105 mg/dL — ABNORMAL HIGH (ref 70–99)
Potassium: 3.6 mmol/L (ref 3.5–5.1)
Sodium: 136 mmol/L (ref 135–145)

## 2021-05-31 LAB — PROCALCITONIN: Procalcitonin: 0.44 ng/mL

## 2021-05-31 LAB — MAGNESIUM: Magnesium: 2.1 mg/dL (ref 1.7–2.4)

## 2021-05-31 MED ORDER — POTASSIUM CHLORIDE CRYS ER 20 MEQ PO TBCR
20.0000 meq | EXTENDED_RELEASE_TABLET | Freq: Once | ORAL | Status: AC
  Administered 2021-05-31: 20 meq via ORAL
  Filled 2021-05-31: qty 1

## 2021-05-31 NOTE — Progress Notes (Signed)
PROGRESS NOTE    Anna Hoffman  LEX:[id number] DOB: [birth year] DOA: 05/22/2021 PCP: Glendale Chard, MD    Chief Complaint  Patient presents with   Emesis    Brief Narrative:  Anna Hoffman, 86 y.o. female with PMH of esophagitis, CKD stage III, HTN, colitis, hypertrophic cardiomyopathy and intracerebral hemorrhage, diabetes   presented to hospital with nausea vomiting and weakness for 2 to 3 days with difficulty ambulating and confusion for few weeks.  She also had a fall with some trauma to the right shoulder.  In the ED, patient was mildly tachycardic and hypertensive.  Creatinine was elevated at 1.82 from baseline 1.2.  There was mild leukocytosis but hemoglobin was 16.1.  X-ray of chest, CT head scan and pelvic scan were negative for acute findings except for left renal angiomyolipoma.  Patient received vancomycin cefepime and Flagyl in the ED and was admitted to hospital after IV fluid bolus.     At this time, cultures have been negative and  antibiotics have been discontinued after completion of 3-day course of cefepime.  Patient continues to be weak and debilitated and PT has recommended skilled nursing facility placement.  Cardiology has seen the patient  for her history of intermittent heart block and question hypertrophic cardiomyopathy and has been adjusted on medications. On  05/26/2021, patient had accelerated hypertension and was less responsive.  Neurology was consulted.  Concern for stroke and cefepime induced neurotoxicity.  MRI showed minute stroke.   Assessment & Plan:   Principal Problem:   SIRS (systemic inflammatory response syndrome) ([name]) Active Problems:   Benign essential HTN   Type 2 diabetes mellitus with stage 3 chronic kidney disease, without long-term current use of insulin ([name])   Mixed hyperlipidemia   Acute renal failure superimposed on stage 3a chronic kidney disease ([name])   Prolonged QT interval   Dehydration   HOCM (hypertrophic obstructive  cardiomyopathy) ([name])   History of CVA (cerebrovascular accident)   Sepsis ([place])   Malnutrition of moderate degree   Acute metabolic encephalopathy  1 SIRS, POA -On admission patient met criteria for SIRS with tachycardia, lactic acidosis, leukocytosis with no obvious source. -CT abdomen and pelvis done with no acute findings. -COVID-19 PCR negative, MRSA PCR negative, blood cultures negative x5 days. -Patient initially placed empirically on broad-spectrum antibiotics and received approximately 3 days worse which was subsequently discontinued due to concern for cefepime induced neurotoxicity and encephalopathy. -Currently being monitored off antibiotics. -Leukocytosis trending down. -Status post LP with no signs of bacterial meningitis or encephalitis.  -Continue to monitor off antibiotics.  2.  Acute metabolic encephalopathy -Questionable etiology. -May be secondary to acute/early subacute infarct and sinus tachycardia with HOCM. -Patient alert and oriented to self place and time, somewhat drowsy. -Neurology doubts if acute/subacute infarct etiology of patient's metabolic encephalopathy. -LP done with no signs of meningitis or encephalitis. -Continue gentle hydration. -Supportive care.  3.  Acute/early subacute infarct -MRI brain with 2 mm subacute/acute infarct on posterior right frontal lobe and 3 mm in the callosal splenium. -Patient noted to have weakness on the left upper and lower extremities. -Patient underwent MRA angiogram head and neck with no large vessel occlusion, moderate intracranial atherosclerotic disease. -Carotid Dopplers with no significant ICA stenosis, vertebral arteries with high resistant flow. -Patient with recent 2D echo done 04/01/2021 and as such not repeated. -Hemoglobin A1c 6.2. -Fasting lipid panel with LDL of 47. -Continue Crestor, amlodipine. -Patient started on aspirin for secondary stroke prophylaxis per neurology. -Neurology was following  but  have signed off as of 05/30/2021.  4.  Acute kidney injury on chronic kidney disease stage IIIa -Baseline creatinine 1.2 peaked at 2.3. -Renal function improving with gentle hydration. -Creatinine currently at 1.78. -Continue IV fluids.  5.  Hypokalemia -Potassium at 3.6. -K-Dur 40 mEq p.o. x1.  6.  Metabolic acidosis -Improving with hydration. -Bicarb now at 22. -Follow.  7.  Sinus tachycardia -Was on IV Lopressor however transitioned to oral Lopressor per cardiology today. -If patient oral intake worsens again will need to be placed back on IV Lopressor. -Per cardiology.  8.  Hypertension -Continue metoprolol, hydralazine.  9.  History of second-degree AV block/first-degree AV block -Patient being followed by cardiology, continue beta-blocker.  10.HOCM -Being followed by cardiology who suspect LVOT obstruction and MR noted on examination. -Per cardiology avoid dehydration. -Continue beta-blocker, IV fluids. -Appreciate cardiology input and recommendations.  11.  Diabetes mellitus type 2 -SSI.  12.  Hyperlipidemia -Statin.  22.  Recent fall/deconditioning/debility -Being followed by PT/OT who are recommending SNF placement. -TOC consulted for placement.  14.  Moderate protein calorie malnutrition -Nutritional supplementation.  15.  Right shoulder pain -Secondary to recent fall and history of retrocochlear. -Plain films of the right shoulder with no acute fracture. -Continue K pad, local analgesics. -Outpatient follow-up.    DVT prophylaxis: SCDs.  Lovenox held for LP.  Lovenox resumed. Code Status: Full Family Communication: No family at bedside.  Updated patient. Disposition:   Status is: Inpatient  Remains inpatient appropriate because: Ongoing management of patient's cardiac issues and encephalopathy.       Consultants:  Neurology: Dr.Khaliqdina 05/27/2021 Cardiology: Dr. Johnsie Cancel 05/24/2021 Interventional radiology  Procedures:  Lumbar  puncture under fluoroscopy, per IR, Dr. Ilda Foil 05/29/2021 CT abdomen and pelvis 05/22/2021 CT head without contrast 05/26/2021, 05/22/2021 Chest x-ray 05/22/2021 MRI C-spine 05/27/2021 MRI angiogram head 05/27/2021 MRI brain 05/27/2021 Renal ultrasound 05/28/2021 Carotid ultrasound 05/28/2021 EEG 05/27/2021     Antimicrobials:  IV cefepime 05/22/2021>>>>> 05/26/2021  IV Flagyl 05/22/2021>>>>> 05/24/2021 IV vancomycin 05/22/2021>>>>> 05/23/2021    Subjective: Laying in bed.  More alert.  States she did not get any breakfast this morning.  States she is hungry.  No chest pain.  No shortness of breath.  No abdominal pain.  States she wants to move around more today.    Objective: Vitals:   05/30/21 1551 05/30/21 1900 05/31/21 0005 05/31/21 0611  BP: (!) 147/86 (!) 155/85 (!) 153/91 (!) 165/84  Pulse: 88 93 87 87  Resp: '18 19 20 20  ' Temp: 98.6 F (37 C) 97.7 F (36.5 C) 98.2 F (36.8 C) 98 F (36.7 C)  TempSrc: Axillary Oral Oral Oral  SpO2: 94% 95% 94% 95%  Weight:      Height:        Intake/Output Summary (Last 24 hours) at 05/31/2021 1125 Last data filed at 05/31/2021 0900 Gross per 24 hour  Intake 600 ml  Output 1050 ml  Net -450 ml    Filed Weights   05/23/21 0237  Weight: 61.4 kg    Examination:  General exam: : NAD Respiratory system: CTA B anterior lung fields.  No wheezes, no rhonchi.  Speaking in full sentences.  Normal respiratory effort. Cardiovascular system: Regular rate and rhythm no murmurs rubs or gallops.  No JVD.  No lower extremity edema.  Gastrointestinal system: Abdomen soft, nontender, nondistended, positive bowel sounds.  No rebound.  No guarding. Central nervous system: Alert and oriented.  Some left-sided weakness.  Extremities: Symmetric 5 x 5  power. Skin: No rashes, lesions or ulcers Psychiatry: Judgement and insight appear normal. Mood & affect appropriate.   Data Reviewed: I have personally reviewed following labs and  imaging studies  CBC: Recent Labs  Lab 05/27/21 0423 05/28/21 0456 05/29/21 0446 05/30/21 0443 05/31/21 0418  WBC 13.9* 19.3* 16.4* 14.1* 11.5*  NEUTROABS  --   --   --  10.7* 8.4*  HGB 15.2* 14.3 11.7* 10.8* 10.5*  HCT 46.0 43.7 35.8* 35.1* 33.3*  MCV 88.5 89.2 89.5 93.9 91.5  PLT 476* 429* 340 293 283     Basic Metabolic Panel: Recent Labs  Lab 05/25/21 0704 05/26/21 0454 05/27/21 0423 05/28/21 0456 05/29/21 0446 05/30/21 0443 05/31/21 0418  NA 133* 130* 134* 137 135 138 136  K 4.0 3.8 4.2 3.9 3.2* 3.3* 3.6  CL 105 103 104 107 109 110 107  CO2 18* 16* 13* 14* 16* 20* 22  GLUCOSE 104* 120* 140* 140* 125* 102* 105*  BUN 25* 26* 40* 55* 56* 48* 36*  CREATININE 1.52* 1.55* 2.14* 2.61* 2.38* 2.18* 1.78*  CALCIUM 9.5 9.8 10.1 9.8 8.7* 8.8* 8.7*  MG 2.1 2.2 2.7*  --  2.4  --  2.1     GFR: Estimated Creatinine Clearance: 21.2 mL/min (A) (by C-G formula based on SCr of 1.78 mg/dL (H)).  Liver Function Tests: No results for input(s): AST, ALT, ALKPHOS, BILITOT, PROT, ALBUMIN in the last 168 hours.  CBG: Recent Labs  Lab 05/30/21 1115 05/30/21 1545 05/30/21 2005 05/31/21 0728 05/31/21 1103  GLUCAP 120* 108* 111* 111* 134*      Recent Results (from the past 240 hour(s))  Culture, Urine     Status: None   Collection Time: 05/22/21 12:18 PM   Specimen: Urine   UR  Result Value Ref Range Status   Urine Culture, Routine Final report  Final   Organism ID, Bacteria Comment  Final    Comment: Culture shows less than 10,000 colony forming units of bacteria per milliliter of urine. This colony count is not generally considered to be clinically significant.   Blood Culture (routine x 2)     Status: None   Collection Time: 05/22/21  9:58 PM   Specimen: Right Antecubital; Blood  Result Value Ref Range Status   Specimen Description   Final    RIGHT ANTECUBITAL Performed at [facility] [address] [place], [place] [address]    Special  Requests   Final    BOTTLES DRAWN AEROBIC AND ANAEROBIC Blood Culture results may not be optimal due to an inadequate volume of blood received in culture bottles Performed at [facility] [address] [place], [place] [address]    Culture   Final    NO GROWTH 5 DAYS Performed at [facility], [place] [address], [place], [place] [address]    Report Status 05/28/2021 FINAL  Final  Blood Culture (routine x 2)     Status: None   Collection Time: 05/22/21  9:59 PM   Specimen: BLOOD LEFT HAND  Result Value Ref Range Status   Specimen Description   Final    BLOOD LEFT HAND Performed at [facility] [address] [place],  [address]    Special Requests   Final    BOTTLES DRAWN AEROBIC ONLY Blood Culture results may not be optimal due to an inadequate volume of blood received in culture bottles Performed at [facility] [address] [place], [place]  [address]    Culture   Final    NO GROWTH 5 DAYS Performed at [facility], [place] [address], [place], [place] [address]    Report Status 05/28/2021 FINAL  Final  Urine Culture     Status: None   Collection Time: 05/23/21 12:30 AM   Specimen: In/Out Cath Urine  Result Value Ref Range Status   Specimen Description   Final    IN/OUT CATH URINE Performed at [facility] [address] [place], [place] [address]    Special Requests   Final    NONE Performed at [facility], [place] [address], [place], [place] [address]    Culture   Final    NO GROWTH Performed at [facility], [place] [address], [place], [place] [address]    Report Status 05/24/2021 FINAL  Final  Surgical pcr screen     Status: Abnormal   Collection Time: 05/23/21 12:49 AM   Specimen: Nasal Mucosa; Nasal Swab  Result Value Ref Range Status   MRSA, PCR NEGATIVE NEGATIVE Final   Staphylococcus aureus POSITIVE (A) NEGATIVE Final    Comment: (NOTE) The  Xpert SA Assay (FDA approved for NASAL specimens in patients 62 years of age and older), is one component of a comprehensive surveillance program. It is not intended to diagnose infection nor to guide or monitor treatment. Performed at [facility], [place] [address] [place],  [address]   Resp Panel by RT-PCR (Flu A&B, Covid) Nasal Mucosa     Status: None   Collection Time: 05/23/21 12:49 AM   Specimen: Nasal Mucosa; Nasopharyngeal(NP) swabs in vial transport medium  Result Value Ref Range Status   SARS Coronavirus 2 by RT PCR NEGATIVE NEGATIVE Final    Comment: (NOTE) SARS-CoV-2 target nucleic acids are NOT DETECTED.  The SARS-CoV-2 RNA is generally detectable in upper respiratory specimens during the acute phase of infection. The lowest concentration of SARS-CoV-2 viral copies this assay can detect is 138 copies/mL. A negative result does not preclude SARS-Cov-2 infection and should not be used as the sole basis for treatment or other patient management decisions. A negative result may occur with  improper specimen collection/handling, submission of specimen other than nasopharyngeal swab, presence of viral mutation(s) within the areas targeted by this assay, and inadequate number of viral copies(<138 copies/mL). A negative result must be combined with clinical observations, patient history, and epidemiological information. The expected result is Negative.  Fact Sheet for Patients:  EntrepreneurPulse.com.au  Fact Sheet for Healthcare Providers:  IncredibleEmployment.be  This test is no t yet approved or cleared by the [place] FDA and  has been authorized for detection and/or diagnosis of SARS-CoV-2 by FDA under an Emergency Use Authorization (EUA). This EUA will remain  in effect (meaning this test can be used) for the duration of the COVID-19 declaration under Section 564(b)(1) of the Act, 21 U.S.C.section  360bbb-3(b)(1), unless the authorization is terminated  or revoked sooner.       Influenza A by PCR NEGATIVE NEGATIVE Final   Influenza B by PCR NEGATIVE NEGATIVE Final    Comment: (NOTE) The Xpert Xpress SARS-CoV-2/FLU/RSV plus assay is intended as an aid in the diagnosis of influenza from Nasopharyngeal swab specimens and should not be used as a sole basis for treatment. Nasal washings and aspirates are unacceptable for Xpert Xpress SARS-CoV-2/FLU/RSV testing.  Fact Sheet for Patients: EntrepreneurPulse.com.au  Fact Sheet for Healthcare Providers: IncredibleEmployment.be  This test is not  yet approved or cleared by the [place] and has been authorized for detection and/or diagnosis of SARS-CoV-2 by FDA under an Emergency Use Authorization (EUA). This EUA will remain in effect (meaning this test can be used) for the duration of the COVID-19 declaration under Section 564(b)(1) of the Act, 21 U.S.C. section 360bbb-3(b)(1), unless the authorization is terminated or revoked.  Performed at [facility], [place] [address] [place], [place] [address]   CSF culture w Gram Stain     Status: None (Preliminary result)   Collection Time: 05/29/21  3:22 PM   Specimen: PATH Cytology CSF; Cerebrospinal Fluid  Result Value Ref Range Status   Specimen Description   Final    CSF Performed at [facility] [address], [place], [place] [address]    Special Requests   Final    NONE Performed at [facility], [place] [address] [place], [place] [address]    Gram Stain   Final    NO WBC SEEN NO ORGANISMS SEEN CYTOSPIN SMEAR Gram Stain Report Called to,Read Back By and Verified With: C.ACHUSIM, RN AT 2683 ON 10.26.22 BY N.Aesha Agrawal Performed at [facility] [address], [place], [place] [address]    Culture   Final    NO GROWTH 2 DAYS Performed at [facility] [address], [place], [place] [address]    Report Status PENDING  Incomplete          Radiology Studies: DG FLUORO GUIDE LUMBAR PUNCTURE  Result Date: 05/29/2021 CLINICAL DATA:  Altered mental status EXAM: DIAGNOSTIC LUMBAR PUNCTURE UNDER FLUOROSCOPIC GUIDANCE FLUOROSCOPY TIME:  Radiation Exposure Index (as provided by the fluoroscopic device): 11.9 mGy If the device does not provide the exposure index: Fluoroscopy Time (in minutes and seconds):  42 seconds Number of Acquired Images:  1 PROCEDURE: Informed consent was obtained from the patient prior to the procedure, including potential complications of headache, allergy, and pain. With the patient prone, the lower back was prepped with Betadine. 1% Lidocaine was used for local anesthesia. Lumbar puncture was performed at the L2-L3 level using a 20 gauge needle with return of clear CSF with an estimated opening pressure of 18 cm water. 10 ml of CSF were obtained for laboratory studies. The patient tolerated the procedure well and there were no apparent complications. IMPRESSION: Successful lumbar puncture. Electronically Signed   By: Suzy Bouchard M.D.   On: 05/29/2021 16:30        Scheduled Meds:  amLODipine  10 mg Oral Daily   aspirin  81 mg Oral Daily   Chlorhexidine Gluconate Cloth  6 each Topical Daily   diclofenac Sodium  2 g Topical QID   enoxaparin (LOVENOX) injection  30 mg Subcutaneous Q24H   feeding supplement (K[employer]MS STANDARD 1.4)  325 mL Oral BID BM   hydrALAZINE  100 mg Oral Q8H   insulin aspart  0-9 Units Subcutaneous TID WC   metoprolol tartrate  12.5 mg Oral BID   multivitamin with minerals  1 tablet Oral Daily   pantoprazole  40 mg Oral BID   rosuvastatin  20 mg Oral Daily   sodium chloride flush  3 mL Intravenous Q12H   thiamine  100 mg Oral Daily   Continuous Infusions:  lactated ringers Stopped (05/30/21 1538)     LOS: 8 days    Time spent: 35 minutes    Irine Seal, MD [facility]   To contact the  attending provider between 7A-7P or the covering provider during after hours 7P-7A, please log into the web site [email or web address] and access using universal [place] password for that web site. If you do not have the password, please call the hospital operator.  05/31/2021, 11:25 AM

## 2021-05-31 NOTE — Progress Notes (Addendum)
Progress Note  Patient Name: Anna Hoffman Date of Encounter: 05/31/2021  [facility] Cardiologist: Donato Heinz, MD   Subjective   A little more awake today.  Inpatient Medications    Scheduled Meds:  amLODipine  10 mg Oral Daily   aspirin  81 mg Oral Daily   Chlorhexidine Gluconate Cloth  6 each Topical Daily   diclofenac Sodium  2 g Topical QID   enoxaparin (LOVENOX) injection  30 mg Subcutaneous Q24H   feeding supplement (K[employer] STANDARD 1.4)  325 mL Oral BID BM   hydrALAZINE  100 mg Oral Q8H   insulin aspart  0-9 Units Subcutaneous TID WC   metoprolol tartrate  12.5 mg Oral BID   multivitamin with minerals  1 tablet Oral Daily   pantoprazole  40 mg Oral BID   rosuvastatin  20 mg Oral Daily   sodium chloride flush  3 mL Intravenous Q12H   thiamine  100 mg Oral Daily   Continuous Infusions:  lactated ringers Stopped (05/30/21 1538)   PRN Meds: acetaminophen **OR** acetaminophen, hydrALAZINE, Muscle Rub, ondansetron (ZOFRAN) IV, polyethylene glycol, polyvinyl alcohol   Vital Signs    Vitals:   05/30/21 1551 05/30/21 1900 05/31/21 0005 05/31/21 0611  BP: (!) 147/86 (!) 155/85 (!) 153/91 (!) 165/84  Pulse: 88 93 87 87  Resp: 18 19 20 20   Temp: 98.6 F (37 C) 97.7 F (36.5 C) 98.2 F (36.8 C) 98 F (36.7 C)  TempSrc: Axillary Oral Oral Oral  SpO2: 94% 95% 94% 95%  Weight:      Height:        Intake/Output Summary (Last 24 hours) at 05/31/2021 1011 Last data filed at 05/31/2021 0600 Gross per 24 hour  Intake 600 ml  Output 850 ml  Net -250 ml   Last 3 Weights 05/23/2021 05/16/2021 05/10/2021  Weight (lbs) 135 lb 5.8 oz 138 lb 138 lb  Weight (kg) 61.4 kg 62.596 kg 62.596 kg      Telemetry    Sinus with first degree heart block and intermittent second degree type 1 block I the 80-90s - Personally Reviewed  ECG    No new tracings - Personally Reviewed  Physical Exam   GEN: No acute distress, awake, slow to answer  questions Neck: No JVD Cardiac: RRR, + systolic murmur Respiratory: Clear to auscultation bilaterally. GI: Soft, nontender, non-distended  MS: No edema; No deformity. Neuro:  drowsy Psych: Normal affect   Labs    High Sensitivity Troponin:   Recent Labs  Lab 05/09/21 1825  TROPONINIHS 4     Chemistry Recent Labs  Lab 05/27/21 0423 05/28/21 0456 05/29/21 0446 05/30/21 0443 05/31/21 0418  NA 134*   < > 135 138 136  K 4.2   < > 3.2* 3.3* 3.6  CL 104   < > 109 110 107  CO2 13*   < > 16* 20* 22  GLUCOSE 140*   < > 125* 102* 105*  BUN 40*   < > 56* 48* 36*  CREATININE 2.14*   < > 2.38* 2.18* 1.78*  CALCIUM 10.1   < > 8.7* 8.8* 8.7*  MG 2.7*  --  2.4  --  2.1  GFRNONAA 22*   < > 20* 22* 28*  ANIONGAP 17*   < > 10 8 7    < > = values in this interval not displayed.    Lipids  Recent Labs  Lab 05/27/21 1645  CHOL 139  TRIG 89  HDL 74  LDLCALC 47  CHOLHDL 1.9    Hematology Recent Labs  Lab 05/29/21 0446 05/30/21 0443 05/31/21 0418  WBC 16.4* 14.1* 11.5*  RBC 4.00 3.74* 3.64*  HGB 11.7* 10.8* 10.5*  HCT 35.8* 35.1* 33.3*  MCV 89.5 93.9 91.5  MCH 29.3 28.9 28.8  MCHC 32.7 30.8 31.5  RDW 15.9* 15.8* 15.3  PLT 340 293 283   Thyroid  Recent Labs  Lab 05/28/21 0456  TSH 1.081  FREET4 1.42*    BNPNo results for input(s): BNP, PROBNP in the last 168 hours.  DDimer No results for input(s): DDIMER in the last 168 hours.   Radiology    DG FLUORO GUIDE LUMBAR PUNCTURE  Result Date: 05/29/2021 CLINICAL DATA:  Altered mental status EXAM: DIAGNOSTIC LUMBAR PUNCTURE UNDER FLUOROSCOPIC GUIDANCE FLUOROSCOPY TIME:  Radiation Exposure Index (as provided by the fluoroscopic device): 11.9 mGy If the device does not provide the exposure index: Fluoroscopy Time (in minutes and seconds):  42 seconds Number of Acquired Images:  1 PROCEDURE: Informed consent was obtained from the patient prior to the procedure, including potential complications of headache, allergy, and pain.  With the patient prone, the lower back was prepped with Betadine. 1% Lidocaine was used for local anesthesia. Lumbar puncture was performed at the L2-L3 level using a 20 gauge needle with return of clear CSF with an estimated opening pressure of 18 cm water. 10 ml of CSF were obtained for laboratory studies. The patient tolerated the procedure well and there were no apparent complications. IMPRESSION: Successful lumbar puncture. Electronically Signed   By: Suzy Bouchard M.D.   On: 05/29/2021 16:30    Cardiac Studies   Echo 04/01/21: 1. Left ventricular ejection fraction, by estimation, is 65 to 70%. The  left ventricle has hyperdynamic function. The left ventricle has no  regional wall motion abnormalities. There is severe focal basal septal  left ventricular hypertrophy, no LV  outflow tract gradient. Left ventricular diastolic parameters are  consistent with Grade I diastolic dysfunction (impaired relaxation). The  average left ventricular global longitudinal strain is -20.7 %. The global  longitudinal strain is normal.   2. Right ventricular systolic function is normal. The right ventricular  size is normal. There is normal pulmonary artery systolic pressure. The  estimated right ventricular systolic pressure is 83.1 mmHg.   3. Left atrial size was mildly dilated.   4. The mitral valve is normal in structure. Trivial mitral valve  regurgitation. No evidence of mitral stenosis.   5. The aortic valve is tricuspid. Aortic valve regurgitation is mild.  Mild aortic valve sclerosis is present, with no evidence of aortic valve  stenosis.   6. Aortic dilatation noted. There is mild dilatation of the ascending  aorta, measuring 41 mm.   7. The inferior vena cava is normal in size with greater than 50%  respiratory variability, suggesting right atrial pressure of 3 mmHg  Patient Profile     86 y.o. female with hypertrophic cardiomyopathy, PSVT, prior 2:1 AV block, syncope 03/2021 (recurrent 1  month later), esophagitis, CKD stage 3a, HTN, ascending aortic dilation, prior intracerebral hemorrhage 2017 and ischemic CVA, prolonged QT, diabetes.    Dr. Lovena Le recommended PPM insertion and the patient wished to wait. He recommended to wean off her beta blocker. More recently notes indicate a plan to move forward with PPM implantation. She had recent right shoulder injury.    She presented with nausea, vomiting, weakness, and worsening lethargy/confusion with SIRS criteria of unclear etiology. Over  the course of several days, lab workup has demonstrated AKI on CKD, hyponatremia, leukocytosis. MRi shows two small subacute strokes on R.   Yesterday, awake oriented x 2, answering questions. Today, not yet awake, not answering questions  Assessment & Plan    SIRS/encephalopathy - mentation seemed improved, but is waxing and waning - imaging and labs remain negative - unclear etiology, primary suspicious for cefepime-induced toxicity - LP unrevealing - acute vs early subacute infarct on MRI brain  - suspect small vessel stroke with lower suspicion that this is contributory to her her AMS at presentation   Sinus tachycardia - confirmed by Dr. Sallyanne Kuster with carotid sinus compression - added BB as ST could worsen HCM - telemetry now with  - IV lopressor switched to 12.5 mg metoprolol BID   First degree heart block Second decree type 1 AV block - continues to have dropped beats on telemetry - continue current dose of BB   HCM - suspect murmur signifying LVOT obstruction and MR - continue BB, avoid dehydration - supplement with IVF if needed   AoCKD stage IIIa - sCr improving to 1.78 after peak of 2.61 - continue to monitor   Hypertension - on amlodipine and 100 mg hydralazine TID        For questions or updates, please contact [facility] Please consult [email or web address] for contact info under        Signed, Ledora Bottcher, PA  05/31/2021, 10:11 AM    I have  seen and examined the patient along with Ledora Bottcher, PA .  I have reviewed the chart, notes and new data.  I agree with PA/NP's note.  Key new complaints: lethargic Key examination changes: murmur less loud today Key new findings / data: improving renal parameters. WBC almost normal  PLAN:  [facility] will sign off.   Medication Recommendations:  metoprolol 12.5 mg BID. Avoid adding vasodilators and diuretics. Other recommendations (labs, testing, etc):  n/a Follow up as an outpatient:  follow up in about a month - will schedule.   Sanda Klein, MD, [place] [phone] 05/31/2021, 10:47 AM

## 2021-05-31 NOTE — Care Management Important Message (Signed)
Medicare IM printed for W/L Social Work to give to the patient. 

## 2021-05-31 NOTE — TOC Progression Note (Addendum)
Transition of Care [facility]) - Progression Note    Patient Details  Name: Anna Hoffman MRN: [id number] Date of Birth: [birth year]  [facility]) CM/SW Contact  Leeroy Cha, RN Phone Number: 05/31/2021, 7:27 AM  Clinical Narrative:    LP not concerning for meningitis/encephalitis. Mentation improved after discontinuing Cefepime. I suspect that the noted Left sided weakness is residual from the noted remote lacunar R thalamic stroke on MRI. I do not think that the 66mm R frontal white matter stroke is causing any symptoms TOC PLAN OF CARE: [facility] has made bed offer when ready. Will need insurance offer when stable. Auth through [employer] started at 1338 Expected Discharge Plan:  (TBD) Barriers to Discharge: Continued Medical Work up  Expected Discharge Plan and Services Expected Discharge Plan:  (TBD)   Discharge Planning Services: CM Consult Post Acute Care Choice: [place] Living arrangements for the past 2 months: Single Family Home                                       Social Determinants of Health (SDOH) Interventions    Readmission Risk Interventions No flowsheet data found.

## 2021-05-31 NOTE — Progress Notes (Signed)
Occupational Therapy Treatment Patient Details Name: Anna Hoffman MRN: [id number] DOB: [birth year] Today's Date: 05/31/2021   History of present illness Pt is a 86 y.o. female presented to hospital with nausea vomiting and weakness for 2 to 3 days with difficulty ambulating and confusion for few weeks. admitted for SIRS (systemic inflammatory response syndrome). She also had a fall with some trauma to the right shoulder for which she was receiving HHPT 2x/wk. PMH significant for esophagitis, CKD stage IIIa, hypertension, colitis, hypertrophic cardiomyopathy and intracerebral hemorrhage, diabetes.   OT comments  Pt limited by lethargy this session. Focused pt session on increasing arousal and use of L side. Pt total A for face washing responding with increase arousal and command following. Pt able to follow simple commands inconsistently and with increased time. Pt provided with LUE AAROM, tapping, and weightbearing to promote LUE movement. Pt functional strength LUE continues to be limited with minimal to no movement. Continuing to recommend SNF rehab, will continue to follow in the acute setting.   Recommendations for follow up therapy are one component of a multi-disciplinary discharge planning process, led by the attending physician.  Recommendations may be updated based on patient status, additional functional criteria and insurance authorization.    Follow Up Recommendations  Skilled nursing-short term rehab (<3 hours/day)    Assistance Recommended at Discharge    Equipment Recommendations  None recommended by OT    Recommendations for Other Services      Precautions / Restrictions Precautions Precautions: Fall Precaution Comments: LUE with little to no active movement Restrictions Weight Bearing Restrictions: No       Mobility Bed Mobility                    Transfers                         Balance Overall balance assessment: Needs  assistance Sitting-balance support: Bilateral upper extremity supported Sitting balance-Leahy Scale: Poor Sitting balance - Comments: pt able to balance self with BUE support and external support from therapist in recliner                                   ADL either performed or assessed with clinical judgement   ADL Overall ADL's : Needs assistance/impaired     Grooming: Total assistance;Wash/dry Biomedical engineer      Cognition Arousal/Alertness: Lethargic Behavior During Therapy: Flat affect Overall Cognitive Status: History of cognitive impairments - at baseline Area of Impairment: Following commands                       Following Commands: Follows one step commands inconsistently;Follows one step commands with increased time     Problem Solving: Requires verbal cues;Requires tactile cues;Difficulty sequencing;Slow processing;Decreased initiation General Comments: able to follow commands with verbal and tactile cues, limited by lethargy          Exercises     Shoulder Instructions       General Comments      Pertinent Vitals/ Pain       Pain  Assessment: Faces Pain Score: 0-No pain  Home Living                                          Prior Functioning/Environment              Frequency  Min 2X/week        Progress Toward Goals  OT Goals(current goals can now be found in the care plan section)  Progress towards OT goals: OT to reassess next treatment  Acute Rehab OT Goals OT Goal Formulation: With family Time For Goal Achievement: 06/07/21 Potential to Achieve Goals: Fair ADL Goals Pt Will Perform Grooming: with set-up;with supervision;sitting Pt Will Perform Upper Body Bathing: with set-up;with supervision;sitting Pt Will Transfer to Toilet: ambulating;regular height toilet;grab bars;with min guard assist Pt Will  Perform Toileting - Clothing Manipulation and hygiene: with mod assist;sit to/from stand  Plan Discharge plan remains appropriate    Co-evaluation                 AM-PAC OT "6 Clicks" Daily Activity     Outcome Measure   Help from another person eating meals?: Total Help from another person taking care of personal grooming?: Total Help from another person toileting, which includes using toliet, bedpan, or urinal?: Total Help from another person bathing (including washing, rinsing, drying)?: Total Help from another person to put on and taking off regular upper body clothing?: Total Help from another person to put on and taking off regular lower body clothing?: Total 6 Click Score: 6    End of Session    OT Visit Diagnosis: Unsteadiness on feet (R26.81);Muscle weakness (generalized) (M62.81);Pain   Activity Tolerance Patient limited by lethargy   Patient Left with call bell/phone within reach;in chair;with chair alarm set   Nurse Communication Mobility status        Time: 3361-2244 OT Time Calculation (min): 12 min  Charges: OT General Charges $OT Visit: 1 Visit OT Treatments $Neuromuscular Re-education: 8-22 mins  Jackquline Denmark, OTS Acute Rehab Office: [phone]   Anna Hoffman 05/31/2021, 3:42 PM

## 2021-06-01 DIAGNOSIS — R6510 Systemic inflammatory response syndrome (SIRS) of non-infectious origin without acute organ dysfunction: Secondary | ICD-10-CM | POA: Diagnosis not present

## 2021-06-01 DIAGNOSIS — N179 Acute kidney failure, unspecified: Secondary | ICD-10-CM | POA: Diagnosis not present

## 2021-06-01 DIAGNOSIS — I10 Essential (primary) hypertension: Secondary | ICD-10-CM | POA: Diagnosis not present

## 2021-06-01 DIAGNOSIS — E860 Dehydration: Secondary | ICD-10-CM | POA: Diagnosis not present

## 2021-06-01 LAB — CBC
HCT: 34.7 % — ABNORMAL LOW (ref 36.0–46.0)
Hemoglobin: 11 g/dL — ABNORMAL LOW (ref 12.0–15.0)
MCH: 29.2 pg (ref 26.0–34.0)
MCHC: 31.7 g/dL (ref 30.0–36.0)
MCV: 92 fL (ref 80.0–100.0)
Platelets: 300 10*3/uL (ref 150–400)
RBC: 3.77 MIL/uL — ABNORMAL LOW (ref 3.87–5.11)
RDW: 15.1 % (ref 11.5–15.5)
WBC: 11.5 10*3/uL — ABNORMAL HIGH (ref 4.0–10.5)
nRBC: 0 % (ref 0.0–0.2)

## 2021-06-01 LAB — BASIC METABOLIC PANEL
Anion gap: 9 (ref 5–15)
BUN: 31 mg/dL — ABNORMAL HIGH (ref 8–23)
CO2: 19 mmol/L — ABNORMAL LOW (ref 22–32)
Calcium: 8.7 mg/dL — ABNORMAL LOW (ref 8.9–10.3)
Chloride: 105 mmol/L (ref 98–111)
Creatinine, Ser: 1.66 mg/dL — ABNORMAL HIGH (ref 0.44–1.00)
GFR, Estimated: 30 mL/min — ABNORMAL LOW (ref >60)
Glucose, Bld: 94 mg/dL (ref 70–99)
Potassium: 3.7 mmol/L (ref 3.5–5.1)
Sodium: 133 mmol/L — ABNORMAL LOW (ref 135–145)

## 2021-06-01 LAB — MAGNESIUM: Magnesium: 2 mg/dL (ref 1.7–2.4)

## 2021-06-01 MED ORDER — SODIUM BICARBONATE 650 MG PO TABS
650.0000 mg | ORAL_TABLET | Freq: Two times a day (BID) | ORAL | Status: DC
  Administered 2021-06-01 – 2021-06-06 (×11): 650 mg via ORAL
  Filled 2021-06-01 (×11): qty 1

## 2021-06-01 NOTE — TOC Progression Note (Addendum)
Transition of Care [facility]) - Progression Note    Patient Details  Name: Anna Hoffman MRN: [id number] Date of Birth: Jul 27, 1935  Transition of Care [facility]) CM/SW Contact  Ross Ludwig, [place] Phone Number: 06/01/2021, 2:04 PM  Clinical Narrative:     CSW contacted patient's insurance, SNF approval is still pending for [facility].  CSW to continue to follow patient's progress throughout discharge planning.   Expected Discharge Plan:  (TBD) Barriers to Discharge: Continued Medical Work up  Expected Discharge Plan and Services Expected Discharge Plan:  (TBD)   Discharge Planning Services: CM Consult Post Acute Care Choice: [place] Living arrangements for the past 2 months: Single Family Home                                       Social Determinants of Health (SDOH) Interventions    Readmission Risk Interventions No flowsheet data found.

## 2021-06-01 NOTE — Progress Notes (Signed)
PROGRESS NOTE    Anna Hoffman  LKG:[id number] DOB: 24-Nov-1934 DOA: 05/22/2021 PCP: Glendale Chard, MD    Chief Complaint  Patient presents with   Emesis    Brief Narrative:  Anna Hoffman, 86 y.o. female with PMH of esophagitis, CKD stage III, HTN, colitis, hypertrophic cardiomyopathy and intracerebral hemorrhage, diabetes   presented to hospital with nausea vomiting and weakness for 2 to 3 days with difficulty ambulating and confusion for few weeks.  She also had a fall with some trauma to the right shoulder.  In the ED, patient was mildly tachycardic and hypertensive.  Creatinine was elevated at 1.82 from baseline 1.2.  There was mild leukocytosis but hemoglobin was 16.1.  X-ray of chest, CT head scan and pelvic scan were negative for acute findings except for left renal angiomyolipoma.  Patient received vancomycin cefepime and Flagyl in the ED and was admitted to hospital after IV fluid bolus.     At this time, cultures have been negative and  antibiotics have been discontinued after completion of 3-day course of cefepime.  Patient continues to be weak and debilitated and PT has recommended skilled nursing facility placement.  Cardiology has seen the patient  for her history of intermittent heart block and question hypertrophic cardiomyopathy and has been adjusted on medications. On  05/26/2021, patient had accelerated hypertension and was less responsive.  Neurology was consulted.  Concern for stroke and cefepime induced neurotoxicity.  MRI showed minute stroke.   Assessment & Plan:   Principal Problem:   SIRS (systemic inflammatory response syndrome) ([name]) Active Problems:   Benign essential HTN   Type 2 diabetes mellitus with stage 3 chronic kidney disease, without long-term current use of insulin ([name])   Mixed hyperlipidemia   Acute renal failure superimposed on stage 3a chronic kidney disease ([name])   Prolonged QT interval   Dehydration   HOCM (hypertrophic obstructive  cardiomyopathy) ([name])   History of CVA (cerebrovascular accident)   Sepsis ([place])   Malnutrition of moderate degree   Acute metabolic encephalopathy  1 SIRS, POA -On admission patient met criteria for SIRS with tachycardia, lactic acidosis, leukocytosis with no obvious source. -CT abdomen and pelvis done with no acute findings. -COVID-19 PCR negative, MRSA PCR negative, blood cultures negative x5 days. -Patient initially placed empirically on broad-spectrum antibiotics and received approximately 3 days worse which was subsequently discontinued due to concern for cefepime induced neurotoxicity and encephalopathy. -Currently being monitored off antibiotics. -Leukocytosis trending down. -Status post LP with no signs of bacterial meningitis or encephalitis.  -Continue to monitor off antibiotics.  2.  Acute metabolic encephalopathy -Questionable etiology. -May be secondary to acute/early subacute infarct and sinus tachycardia with HOCM. -Patient more alert, oriented to self place and time, following commands appropriately.  -Neurology doubts if acute/subacute infarct etiology of patient's metabolic encephalopathy. -LP done with no signs of meningitis or encephalitis. -Continue gentle hydration. -Supportive care.  3.  Acute/early subacute infarct -MRI brain with 2 mm subacute/acute infarct on posterior right frontal lobe and 3 mm in the callosal splenium. -Patient noted to have weakness on the left upper and lower extremities. -Patient underwent MRA angiogram head and neck with no large vessel occlusion, moderate intracranial atherosclerotic disease. -Carotid Dopplers with no significant ICA stenosis, vertebral arteries with high resistant flow. -Patient with recent 2D echo done 04/01/2021 and as such not repeated. -Hemoglobin A1c 6.2. -Fasting lipid panel with LDL of 47. -Continue Crestor, amlodipine. -Patient started on aspirin for secondary stroke prophylaxis per neurology. -Neurology  was following but have signed off as of 05/30/2021.  4.  Acute kidney injury on chronic kidney disease stage IIIa -Baseline creatinine 1.2 peaked at 2.3. -Renal function improving with gentle hydration. -Creatinine currently at 1.66 -Continue IV fluids.  5.  Hypokalemia -Potassium at 3.7. -Follow.  6.  Metabolic acidosis -Fluctuating.   -Bicarbonate 19.   -Oral bicarb tablets x3 days.   -Follow.   7.  Sinus tachycardia -Was on IV Lopressor however transitioned to oral Lopressor per cardiology which patient is tolerating. -If patient oral intake worsens again will need to be placed back on IV Lopressor. -Per cardiology.  8.  Hypertension -Continue metoprolol, hydralazine, Norvasc.  9.  History of second-degree AV block/first-degree AV block -Patient being followed by cardiology, continue beta-blocker.  10.HOCM -Being followed by cardiology who suspect LVOT obstruction and MR noted on examination. -Per cardiology avoid dehydration. -Continue beta-blocker, IV fluids. -Appreciate cardiology input and recommendations.  11.  Diabetes mellitus type 2 -SSI  12.  Hyperlipidemia -Statin  13.  Recent fall/deconditioning/debility -Being followed by PT/OT who are recommending SNF placement. -TOC consulted for placement.  14.  Moderate protein calorie malnutrition -Nutritional supplementation.  15.  Right shoulder pain -Secondary to recent fall and history of retrocochlear. -Plain films of the right shoulder with no acute fracture. -Continue K pad, local analgesics. -Outpatient follow-up.    DVT prophylaxis: Lovenox. Code Status: Full Family Communication: No family at bedside.  Updated patient. Disposition:   Status is: Inpatient  Remains inpatient appropriate because: Ongoing management of patient's cardiac issues and encephalopathy.  Unsafe discharge plan.       Consultants:  Neurology: Dr.Khaliqdina 05/27/2021 Cardiology: Dr. Johnsie Cancel  05/24/2021 Interventional radiology  Procedures:  Lumbar puncture under fluoroscopy, per IR, Dr. Ilda Foil 05/29/2021 CT abdomen and pelvis 05/22/2021 CT head without contrast 05/26/2021, 05/22/2021 Chest x-ray 05/22/2021 MRI C-spine 05/27/2021 MRI angiogram head 05/27/2021 MRI brain 05/27/2021 Renal ultrasound 05/28/2021 Carotid ultrasound 05/28/2021 EEG 05/27/2021     Antimicrobials:  IV cefepime 05/22/2021>>>>> 05/26/2021  IV Flagyl 05/22/2021>>>>> 05/24/2021 IV vancomycin 05/22/2021>>>>> 05/23/2021    Subjective: Sitting up in chair.  Denies any chest pain.  No shortness of breath.  Alert.  States she is eating everything she can get her hands on.   Objective: Vitals:   06/01/21 0500 06/01/21 0600 06/01/21 0825 06/01/21 0959  BP: (!) 142/86 (!) 163/86 (!) 156/97 136/80  Pulse: 85  (!) 109 88  Resp: 20   18  Temp: 98 F (36.7 C)   99 F (37.2 C)  TempSrc: Oral   Oral  SpO2: 98%   94%  Weight:      Height:        Intake/Output Summary (Last 24 hours) at 06/01/2021 1128 Last data filed at 06/01/2021 0900 Gross per 24 hour  Intake 1560 ml  Output 900 ml  Net 660 ml    Filed Weights   05/23/21 0237  Weight: 61.4 kg    Examination:  General exam: : NAD Respiratory system: CTA B.  No wheezes, no rhonchi.  Speaking in full sentences.  Normal respiratory effort. Cardiovascular system: Regular rate and rhythm no murmurs rubs or gallops.  No JVD.  No lower extremity edema.  Gastrointestinal system: Abdomen soft, nontender, nondistended, positive bowel sounds.  No rebound.  No guarding. Central nervous system: Alert and oriented. No focal neurological deficits. Extremities: Symmetric 5 x 5 power. Skin: No rashes, lesions or ulcers Psychiatry: Judgement and insight appear fair. Mood & affect appropriate.  Data Reviewed: I  have personally reviewed following labs and imaging studies  CBC: Recent Labs  Lab 05/28/21 0456 05/29/21 0446 05/30/21 0443  05/31/21 0418 06/01/21 0445  WBC 19.3* 16.4* 14.1* 11.5* 11.5*  NEUTROABS  --   --  10.7* 8.4*  --   HGB 14.3 11.7* 10.8* 10.5* 11.0*  HCT 43.7 35.8* 35.1* 33.3* 34.7*  MCV 89.2 89.5 93.9 91.5 92.0  PLT 429* 340 293 283 300     Basic Metabolic Panel: Recent Labs  Lab 05/26/21 0454 05/27/21 0423 05/28/21 0456 05/29/21 0446 05/30/21 0443 05/31/21 0418 06/01/21 0445  NA 130* 134* 137 135 138 136 133*  K 3.8 4.2 3.9 3.2* 3.3* 3.6 3.7  CL 103 104 107 109 110 107 105  CO2 16* 13* 14* 16* 20* 22 19*  GLUCOSE 120* 140* 140* 125* 102* 105* 94  BUN 26* 40* 55* 56* 48* 36* 31*  CREATININE 1.55* 2.14* 2.61* 2.38* 2.18* 1.78* 1.66*  CALCIUM 9.8 10.1 9.8 8.7* 8.8* 8.7* 8.7*  MG 2.2 2.7*  --  2.4  --  2.1 2.0     GFR: Estimated Creatinine Clearance: 22.7 mL/min (A) (by C-G formula based on SCr of 1.66 mg/dL (H)).  Liver Function Tests: No results for input(s): AST, ALT, ALKPHOS, BILITOT, PROT, ALBUMIN in the last 168 hours.  CBG: Recent Labs  Lab 05/30/21 1115 05/30/21 1545 05/30/21 2005 05/31/21 0728 05/31/21 1103  GLUCAP 120* 108* 111* 111* 134*      Recent Results (from the past 240 hour(s))  Culture, Urine     Status: None   Collection Time: 05/22/21 12:18 PM   Specimen: Urine   UR  Result Value Ref Range Status   Urine Culture, Routine Final report  Final   Organism ID, Bacteria Comment  Final    Comment: Culture shows less than 10,000 colony forming units of bacteria per milliliter of urine. This colony count is not generally considered to be clinically significant.   Blood Culture (routine x 2)     Status: None   Collection Time: 05/22/21  9:58 PM   Specimen: Right Antecubital; Blood  Result Value Ref Range Status   Specimen Description   Final    RIGHT ANTECUBITAL Performed at [facility] [address], [place], [place] [address]    Special Requests   Final    BOTTLES DRAWN AEROBIC AND ANAEROBIC Blood Culture results may not be  optimal due to an inadequate volume of blood received in culture bottles Performed at [facility] [address] [place], [place] [address]    Culture   Final    NO GROWTH 5 DAYS Performed at [facility], [place] [address], [place], [place] [address]    Report Status 05/28/2021 FINAL  Final  Blood Culture (routine x 2)     Status: None   Collection Time: 05/22/21  9:59 PM   Specimen: BLOOD LEFT HAND  Result Value Ref Range Status   Specimen Description   Final    BLOOD LEFT HAND Performed at [facility] [address] [place], [place] [address]    Special Requests   Final    BOTTLES DRAWN AEROBIC ONLY Blood Culture results may not be optimal due to an inadequate volume of blood received in culture bottles Performed at [facility] [address], [place], [place] [address]    Culture   Final    NO GROWTH 5 DAYS Performed at [facility], [address] [place], [place] [address]    Report Status 05/28/2021 FINAL  Final  Urine Culture     Status: None   Collection Time: 05/23/21 12:30 AM   Specimen: In/Out Cath Urine  Result Value Ref Range Status   Specimen Description   Final    IN/OUT CATH URINE Performed at [facility] [address] [place], [place] [address]    Special Requests   Final    NONE Performed at [facility], [place] [address] [place], [place] [address]    Culture   Final    NO GROWTH Performed at [facility], [place] [address] [place], [place] [address]    Report Status 05/24/2021 FINAL  Final  Surgical pcr screen     Status: Abnormal   Collection Time: 05/23/21 12:49 AM   Specimen: Nasal Mucosa; Nasal Swab  Result Value Ref Range Status   MRSA, PCR NEGATIVE NEGATIVE Final   Staphylococcus aureus POSITIVE (A) NEGATIVE Final    Comment: (NOTE) The Xpert SA Assay (FDA approved for NASAL specimens in patients 40 years of age and older),  is one component of a comprehensive surveillance program. It is not intended to diagnose infection nor to guide or monitor treatment. Performed at [facility], [place] [address] [place], [place] [address]   Resp Panel by RT-PCR (Flu A&B, Covid) Nasal Mucosa     Status: None   Collection Time: 05/23/21 12:49 AM   Specimen: Nasal Mucosa; Nasopharyngeal(NP) swabs in vial transport medium  Result Value Ref Range Status   SARS Coronavirus 2 by RT PCR NEGATIVE NEGATIVE Final    Comment: (NOTE) SARS-CoV-2 target nucleic acids are NOT DETECTED.  The SARS-CoV-2 RNA is generally detectable in upper respiratory specimens during the acute phase of infection. The lowest concentration of SARS-CoV-2 viral copies this assay can detect is 138 copies/mL. A negative result does not preclude SARS-Cov-2 infection and should not be used as the sole basis for treatment or other patient management decisions. A negative result may occur with  improper specimen collection/handling, submission of specimen other than nasopharyngeal swab, presence of viral mutation(s) within the areas targeted by this assay, and inadequate number of viral copies(<138 copies/mL). A negative result must be combined with clinical observations, patient history, and epidemiological information. The expected result is Negative.  Fact Sheet for Patients:  EntrepreneurPulse.com.au  Fact Sheet for Healthcare Providers:  IncredibleEmployment.be  This test is no t yet approved or cleared by the [place] FDA and  has been authorized for detection and/or diagnosis of SARS-CoV-2 by FDA under an Emergency Use Authorization (EUA). This EUA will remain  in effect (meaning this test can be used) for the duration of the COVID-19 declaration under Section 564(b)(1) of the Act, 21 U.S.C.section 360bbb-3(b)(1), unless the authorization is terminated  or revoked sooner.        Influenza A by PCR NEGATIVE NEGATIVE Final   Influenza B by PCR NEGATIVE NEGATIVE Final    Comment: (NOTE) The Xpert Xpress SARS-CoV-2/FLU/RSV plus assay is intended as an aid in the diagnosis of influenza from Nasopharyngeal swab specimens and should not be used as a sole basis for treatment. Nasal washings and aspirates are unacceptable for Xpert Xpress SARS-CoV-2/FLU/RSV testing.  Fact Sheet for Patients: EntrepreneurPulse.com.au  Fact Sheet for Healthcare Providers: IncredibleEmployment.be  This test is not yet approved or cleared by the [place] FDA and has been authorized for detection and/or diagnosis of SARS-CoV-2 by FDA under  an Emergency Use Authorization (EUA). This EUA will remain in effect (meaning this test can be used) for the duration of the COVID-19 declaration under Section 564(b)(1) of the Act, 21 U.S.C. section 360bbb-3(b)(1), unless the authorization is terminated or revoked.  Performed at [facility], [place] [address] [place], [place] [address]   CSF culture w Gram Stain     Status: None (Preliminary result)   Collection Time: 05/29/21  3:22 PM   Specimen: PATH Cytology CSF; Cerebrospinal Fluid  Result Value Ref Range Status   Specimen Description   Final    CSF Performed at [facility] [address] [place], [place] [address]    Special Requests   Final    NONE Performed at [facility], [place] [address] [place], [place] [address]    Gram Stain   Final    NO WBC SEEN NO ORGANISMS SEEN CYTOSPIN SMEAR Gram Stain Report Called to,Read Back By and Verified With: C.ACHUSIM, RN AT 7371 ON 10.26.22 BY N.Noeli Lavery Performed at [facility] [address], [place], [place] [address]    Culture   Final    NO GROWTH 3 DAYS Performed at [facility], [place] [address], [place], [place] [address]    Report Status PENDING  Incomplete           Radiology Studies: No results found.      Scheduled Meds:  amLODipine  10 mg Oral Daily   aspirin  81 mg Oral Daily   Chlorhexidine Gluconate Cloth  6 each Topical Daily   diclofenac Sodium  2 g Topical QID   enoxaparin (LOVENOX) injection  30 mg Subcutaneous Q24H   feeding supplement (KATE FARMS STANDARD 1.4)  325 mL Oral BID BM   hydrALAZINE  100 mg Oral Q8H   insulin aspart  0-9 Units Subcutaneous TID WC   metoprolol tartrate  12.5 mg Oral BID   multivitamin with minerals  1 tablet Oral Daily   pantoprazole  40 mg Oral BID   rosuvastatin  20 mg Oral Daily   sodium chloride flush  3 mL Intravenous Q12H   thiamine  100 mg Oral Daily   Continuous Infusions:  lactated ringers 100 mL/hr at 06/01/21 0655     LOS: 9 days    Time spent: 35 minutes    Irine Seal, MD [facility]   To contact the attending provider between 7A-7P or the covering provider during after hours 7P-7A, please log into the web site [email or web address] and access using universal [place] password for that web site. If you do not have the password, please call the hospital operator.  06/01/2021, 11:28 AM

## 2021-06-02 DIAGNOSIS — I10 Essential (primary) hypertension: Secondary | ICD-10-CM | POA: Diagnosis not present

## 2021-06-02 DIAGNOSIS — E860 Dehydration: Secondary | ICD-10-CM | POA: Diagnosis not present

## 2021-06-02 DIAGNOSIS — R6510 Systemic inflammatory response syndrome (SIRS) of non-infectious origin without acute organ dysfunction: Secondary | ICD-10-CM | POA: Diagnosis not present

## 2021-06-02 DIAGNOSIS — N179 Acute kidney failure, unspecified: Secondary | ICD-10-CM | POA: Diagnosis not present

## 2021-06-02 LAB — CBC
HCT: 32.6 % — ABNORMAL LOW (ref 36.0–46.0)
Hemoglobin: 10.4 g/dL — ABNORMAL LOW (ref 12.0–15.0)
MCH: 28.6 pg (ref 26.0–34.0)
MCHC: 31.9 g/dL (ref 30.0–36.0)
MCV: 89.6 fL (ref 80.0–100.0)
Platelets: 349 10*3/uL (ref 150–400)
RBC: 3.64 MIL/uL — ABNORMAL LOW (ref 3.87–5.11)
RDW: 14.8 % (ref 11.5–15.5)
WBC: 11.5 10*3/uL — ABNORMAL HIGH (ref 4.0–10.5)
nRBC: 0 % (ref 0.0–0.2)

## 2021-06-02 LAB — BASIC METABOLIC PANEL
Anion gap: 9 (ref 5–15)
BUN: 31 mg/dL — ABNORMAL HIGH (ref 8–23)
CO2: 23 mmol/L (ref 22–32)
Calcium: 8.5 mg/dL — ABNORMAL LOW (ref 8.9–10.3)
Chloride: 103 mmol/L (ref 98–111)
Creatinine, Ser: 1.57 mg/dL — ABNORMAL HIGH (ref 0.44–1.00)
GFR, Estimated: 32 mL/min — ABNORMAL LOW (ref >60)
Glucose, Bld: 100 mg/dL — ABNORMAL HIGH (ref 70–99)
Potassium: 3.8 mmol/L (ref 3.5–5.1)
Sodium: 135 mmol/L (ref 135–145)

## 2021-06-02 LAB — CSF CULTURE W GRAM STAIN
Culture: NO GROWTH
Gram Stain: NONE SEEN

## 2021-06-02 NOTE — Progress Notes (Signed)
PROGRESS NOTE    Anna Hoffman  LKG:[id number] DOB: 24-Nov-1934 DOA: 05/22/2021 PCP: Glendale Chard, MD    Chief Complaint  Patient presents with   Emesis    Brief Narrative:  Anna Hoffman, 86 y.o. female with PMH of esophagitis, CKD stage III, HTN, colitis, hypertrophic cardiomyopathy and intracerebral hemorrhage, diabetes   presented to hospital with nausea vomiting and weakness for 2 to 3 days with difficulty ambulating and confusion for few weeks.  She also had a fall with some trauma to the right shoulder.  In the ED, patient was mildly tachycardic and hypertensive.  Creatinine was elevated at 1.82 from baseline 1.2.  There was mild leukocytosis but hemoglobin was 16.1.  X-ray of chest, CT head scan and pelvic scan were negative for acute findings except for left renal angiomyolipoma.  Patient received vancomycin cefepime and Flagyl in the ED and was admitted to hospital after IV fluid bolus.     At this time, cultures have been negative and  antibiotics have been discontinued after completion of 3-day course of cefepime.  Patient continues to be weak and debilitated and PT has recommended skilled nursing facility placement.  Cardiology has seen the patient  for her history of intermittent heart block and question hypertrophic cardiomyopathy and has been adjusted on medications. On  05/26/2021, patient had accelerated hypertension and was less responsive.  Neurology was consulted.  Concern for stroke and cefepime induced neurotoxicity.  MRI showed minute stroke.   Assessment & Plan:   Principal Problem:   SIRS (systemic inflammatory response syndrome) ([name]) Active Problems:   Benign essential HTN   Type 2 diabetes mellitus with stage 3 chronic kidney disease, without long-term current use of insulin ([name])   Mixed hyperlipidemia   Acute renal failure superimposed on stage 3a chronic kidney disease ([name])   Prolonged QT interval   Dehydration   HOCM (hypertrophic obstructive  cardiomyopathy) ([name])   History of CVA (cerebrovascular accident)   Sepsis ([place])   Malnutrition of moderate degree   Acute metabolic encephalopathy  1 SIRS, POA -On admission patient met criteria for SIRS with tachycardia, lactic acidosis, leukocytosis with no obvious source. -CT abdomen and pelvis done with no acute findings. -COVID-19 PCR negative, MRSA PCR negative, blood cultures negative x5 days. -Patient initially placed empirically on broad-spectrum antibiotics and received approximately 3 days worse which was subsequently discontinued due to concern for cefepime induced neurotoxicity and encephalopathy. -Currently being monitored off antibiotics. -Leukocytosis trending down. -Status post LP with no signs of bacterial meningitis or encephalitis.  -Continue to monitor off antibiotics.  2.  Acute metabolic encephalopathy -Questionable etiology. -May be secondary to acute/early subacute infarct and sinus tachycardia with HOCM. -Patient more alert, oriented to self place and time, following commands appropriately.  -Neurology doubts if acute/subacute infarct etiology of patient's metabolic encephalopathy. -LP done with no signs of meningitis or encephalitis. -Continue gentle hydration. -Supportive care.  3.  Acute/early subacute infarct -MRI brain with 2 mm subacute/acute infarct on posterior right frontal lobe and 3 mm in the callosal splenium. -Patient noted to have weakness on the left upper and lower extremities. -Patient underwent MRA angiogram head and neck with no large vessel occlusion, moderate intracranial atherosclerotic disease. -Carotid Dopplers with no significant ICA stenosis, vertebral arteries with high resistant flow. -Patient with recent 2D echo done 04/01/2021 and as such not repeated. -Hemoglobin A1c 6.2. -Fasting lipid panel with LDL of 47. -Continue Crestor, amlodipine. -Patient started on aspirin for secondary stroke prophylaxis per neurology. -Neurology  was following but have signed off as of 05/30/2021. -Outpatient follow-up with neurology.  4.  Acute kidney injury on chronic kidney disease stage IIIa -Baseline creatinine 1.2 peaked at 2.3. -Renal function improving with gentle hydration. -Creatinine currently at 1.57. -Saline lock IV fluids.  5.  Hypokalemia -Potassium at 3.8. -Follow.  6.  Metabolic acidosis -Fluctuating.   -Bicarb of 23. -Oral bicarb tablets x2 days.   -Follow.   7.  Sinus tachycardia -Was on IV Lopressor however transitioned to oral Lopressor per cardiology which patient is tolerating. -If patient oral intake worsens again will need to be placed back on IV Lopressor. -Per cardiology.  8.  Hypertension -Continue hydralazine, Norvasc, metoprolol.    9.  History of second-degree AV block/first-degree AV block -Patient being followed by cardiology, continue beta-blocker.  10.HOCM -Being followed by cardiology who suspect LVOT obstruction and MR noted on examination. -Per cardiology avoid dehydration. -Continue beta-blocker. -Saline lock IV fluids. -Appreciate cardiology input and recommendations.  11.  Well-controlled diabetes mellitus type 2 -Hemoglobin A1c 6.2 (05/28/2021). -CBG 111 -SSI.   12.  Hyperlipidemia -Continue statin  13.  Recent fall/deconditioning/debility -Being followed by PT/OT who are recommending SNF placement. -TOC consulted for placement.  14.  Moderate protein calorie malnutrition -Continue nutritional supplementation.   -Patient on dysphagia 1 diet which she really does not like.   -Patient seems more alert and as such we will advance to heart healthy diet and have SLP reevaluate.   15.  Right shoulder pain -Secondary to recent fall and history of retrocochlear. -Plain films of the right shoulder with no acute fracture. -Continue K pad, local analgesics. -Outpatient follow-up.    DVT prophylaxis: Lovenox. Code Status: Full Family Communication: No family at  bedside.  Updated patient. Disposition:   Status is: Inpatient  Remains inpatient appropriate because: Ongoing management of patient's cardiac issues and encephalopathy.  Unsafe discharge plan.       Consultants:  Neurology: Dr.Khaliqdina 05/27/2021 Cardiology: Dr. Johnsie Cancel 05/24/2021 Interventional radiology  Procedures:  Lumbar puncture under fluoroscopy, per IR, Dr. Ilda Foil 05/29/2021 CT abdomen and pelvis 05/22/2021 CT head without contrast 05/26/2021, 05/22/2021 Chest x-ray 05/22/2021 MRI C-spine 05/27/2021 MRI angiogram head 05/27/2021 MRI brain 05/27/2021 Renal ultrasound 05/28/2021 Carotid ultrasound 05/28/2021 EEG 05/27/2021     Antimicrobials:  IV cefepime 05/22/2021>>>>> 05/26/2021  IV Flagyl 05/22/2021>>>>> 05/24/2021 IV vancomycin 05/22/2021>>>>> 05/23/2021    Subjective: Patient sitting in recliner.  Denies any chest pain.  No shortness of breath.  No abdominal pain.  Does not like current dysphagia 3 diet stated was eating regular food prior to admission.  Overall feeling better.  Hopeful to go home with home health rather than to a SNF.   Objective: Vitals:   06/01/21 2025 06/02/21 0622 06/02/21 0622 06/02/21 0844  BP: (!) 147/74 (!) 162/86 (!) 162/86 (!) 165/87  Pulse: 100  (!) 108 (!) 115  Resp: 19     Temp: 98.4 F (36.9 C)  98.6 F (37 C)   TempSrc: Oral  Oral   SpO2: 99%  94%   Weight:      Height:        Intake/Output Summary (Last 24 hours) at 06/02/2021 1233 Last data filed at 06/02/2021 0902 Gross per 24 hour  Intake 2571.94 ml  Output 750 ml  Net 1821.94 ml    Filed Weights   05/23/21 0237  Weight: 61.4 kg    Examination:  General exam: : NAD Respiratory system: CTA B.  No wheezes, no rhonchi.  Speaking in  full sentences.  Normal respiratory effort. Cardiovascular system: Regular rate and rhythm no murmurs rubs or gallops.  No JVD.  No lower extremity edema.  Gastrointestinal system: Abdomen soft, nontender,  nondistended, positive bowel sounds.  No rebound.  No guarding. Central nervous system: Alert and oriented. No focal neurological deficits. Extremities: Symmetric 5 x 5 power. Skin: No rashes, lesions or ulcers Psychiatry: Judgement and insight appear fair. Mood & affect appropriate.  Data Reviewed: I have personally reviewed following labs and imaging studies  CBC: Recent Labs  Lab 05/29/21 0446 05/30/21 0443 05/31/21 0418 06/01/21 0445 06/02/21 0501  WBC 16.4* 14.1* 11.5* 11.5* 11.5*  NEUTROABS  --  10.7* 8.4*  --   --   HGB 11.7* 10.8* 10.5* 11.0* 10.4*  HCT 35.8* 35.1* 33.3* 34.7* 32.6*  MCV 89.5 93.9 91.5 92.0 89.6  PLT 340 293 283 300 349     Basic Metabolic Panel: Recent Labs  Lab 05/27/21 0423 05/28/21 0456 05/29/21 0446 05/30/21 0443 05/31/21 0418 06/01/21 0445 06/02/21 0501  NA 134*   < > 135 138 136 133* 135  K 4.2   < > 3.2* 3.3* 3.6 3.7 3.8  CL 104   < > 109 110 107 105 103  CO2 13*   < > 16* 20* 22 19* 23  GLUCOSE 140*   < > 125* 102* 105* 94 100*  BUN 40*   < > 56* 48* 36* 31* 31*  CREATININE 2.14*   < > 2.38* 2.18* 1.78* 1.66* 1.57*  CALCIUM 10.1   < > 8.7* 8.8* 8.7* 8.7* 8.5*  MG 2.7*  --  2.4  --  2.1 2.0  --    < > = values in this interval not displayed.     GFR: Estimated Creatinine Clearance: 24 mL/min (A) (by C-G formula based on SCr of 1.57 mg/dL (H)).  Liver Function Tests: No results for input(s): AST, ALT, ALKPHOS, BILITOT, PROT, ALBUMIN in the last 168 hours.  CBG: Recent Labs  Lab 05/30/21 1115 05/30/21 1545 05/30/21 2005 05/31/21 0728 05/31/21 1103  GLUCAP 120* 108* 111* 111* 134*      Recent Results (from the past 240 hour(s))  CSF culture w Gram Stain     Status: None   Collection Time: 05/29/21  3:22 PM   Specimen: PATH Cytology CSF; Cerebrospinal Fluid  Result Value Ref Range Status   Specimen Description   Final    CSF Performed at [facility], [place] [address] [place], [place] [address]     Special Requests   Final    NONE Performed at [facility], [place] [address], [place], [place] [address]    Gram Stain   Final    NO WBC SEEN NO ORGANISMS SEEN CYTOSPIN SMEAR Gram Stain Report Called to,Read Back By and Verified With: C.ACHUSIM, RN AT 2956 ON 10.26.22 BY N.Salene Mohamud Performed at [facility] [address] [place],  [address]    Culture   Final    NO GROWTH 3 DAYS Performed at [facility], [place] [address] [place],  [address]    Report Status 06/02/2021 FINAL  Final          Radiology Studies: No results found.      Scheduled Meds:  amLODipine  10 mg Oral Daily   aspirin  81 mg Oral Daily   Chlorhexidine Gluconate Cloth  6 each Topical Daily   diclofenac Sodium  2 g Topical QID   enoxaparin (  LOVENOX) injection  30 mg Subcutaneous Q24H   feeding supplement (KATE FARMS STANDARD 1.4)  325 mL Oral BID BM   hydrALAZINE  100 mg Oral Q8H   insulin aspart  0-9 Units Subcutaneous TID WC   metoprolol tartrate  12.5 mg Oral BID   multivitamin with minerals  1 tablet Oral Daily   pantoprazole  40 mg Oral BID   rosuvastatin  20 mg Oral Daily   sodium bicarbonate  650 mg Oral BID   sodium chloride flush  3 mL Intravenous Q12H   thiamine  100 mg Oral Daily   Continuous Infusions:  lactated ringers 75 mL/hr at 06/02/21 0902     LOS: 10 days    Time spent: 35 minutes    Irine Seal, MD [facility]   To contact the attending provider between 7A-7P or the covering provider during after hours 7P-7A, please log into the web site [email or web address] and access using universal [place] password for that web site. If you do not have the password, please call the hospital operator.  06/02/2021, 12:33 PM

## 2021-06-02 NOTE — TOC Progression Note (Signed)
Transition of Care [facility]) - Progression Note    Patient Details  Name: Anna Hoffman MRN: [id number] Date of Birth: 1934-11-05  Transition of Care [facility]) CM/SW Contact  Labrandon Knoch, Juliann Pulse, RN Phone Number: 06/02/2021, 2:58 PM  Clinical Narrative: Checked with Bernadene Bell on SNF auth-corrected-new [facility] pending [phone] for SNF-await auth.      Expected Discharge Plan: [place] Barriers to Discharge: Continued Medical Work up  Expected Discharge Plan and Services Expected Discharge Plan: [place]   Discharge Planning Services: CM Consult Post Acute Care Choice: [place] Living arrangements for the past 2 months: Single Family Home                                       Social Determinants of Health (SDOH) Interventions    Readmission Risk Interventions No flowsheet data found.

## 2021-06-03 ENCOUNTER — Inpatient Hospital Stay (HOSPITAL_COMMUNITY): Payer: Medicare Other

## 2021-06-03 DIAGNOSIS — E1122 Type 2 diabetes mellitus with diabetic chronic kidney disease: Secondary | ICD-10-CM

## 2021-06-03 DIAGNOSIS — I10 Essential (primary) hypertension: Secondary | ICD-10-CM | POA: Diagnosis not present

## 2021-06-03 DIAGNOSIS — N1831 Chronic kidney disease, stage 3a: Secondary | ICD-10-CM | POA: Diagnosis not present

## 2021-06-03 LAB — GLUCOSE, CAPILLARY
Glucose-Capillary: 112 mg/dL — ABNORMAL HIGH (ref 70–99)
Glucose-Capillary: 164 mg/dL — ABNORMAL HIGH (ref 70–99)
Glucose-Capillary: 109 mg/dL — ABNORMAL HIGH (ref 70–99)
Glucose-Capillary: 128 mg/dL — ABNORMAL HIGH (ref 70–99)
Glucose-Capillary: 139 mg/dL — ABNORMAL HIGH (ref 70–99)
Glucose-Capillary: 171 mg/dL — ABNORMAL HIGH (ref 70–99)
Glucose-Capillary: 117 mg/dL — ABNORMAL HIGH (ref 70–99)
Glucose-Capillary: 147 mg/dL — ABNORMAL HIGH (ref 70–99)
Glucose-Capillary: 183 mg/dL — ABNORMAL HIGH (ref 70–99)
Glucose-Capillary: 156 mg/dL — ABNORMAL HIGH (ref 70–99)
Glucose-Capillary: 107 mg/dL — ABNORMAL HIGH (ref 70–99)
Glucose-Capillary: 130 mg/dL — ABNORMAL HIGH (ref 70–99)

## 2021-06-03 LAB — BASIC METABOLIC PANEL
Anion gap: 9 (ref 5–15)
BUN: 36 mg/dL — ABNORMAL HIGH (ref 8–23)
CO2: 24 mmol/L (ref 22–32)
Calcium: 8.6 mg/dL — ABNORMAL LOW (ref 8.9–10.3)
Chloride: 101 mmol/L (ref 98–111)
Creatinine, Ser: 1.83 mg/dL — ABNORMAL HIGH (ref 0.44–1.00)
GFR, Estimated: 27 mL/min — ABNORMAL LOW (ref >60)
Glucose, Bld: 126 mg/dL — ABNORMAL HIGH (ref 70–99)
Potassium: 4 mmol/L (ref 3.5–5.1)
Sodium: 134 mmol/L — ABNORMAL LOW (ref 135–145)

## 2021-06-03 LAB — MAGNESIUM: Magnesium: 2.2 mg/dL (ref 1.7–2.4)

## 2021-06-03 LAB — RESP PANEL BY RT-PCR (FLU A&B, COVID) ARPGX2
Influenza A by PCR: NEGATIVE
Influenza B by PCR: NEGATIVE
SARS Coronavirus 2 by RT PCR: NEGATIVE

## 2021-06-03 MED ORDER — SODIUM CHLORIDE 0.9 % IV BOLUS
500.0000 mL | Freq: Once | INTRAVENOUS | Status: AC
  Administered 2021-06-03: 500 mL via INTRAVENOUS

## 2021-06-03 NOTE — Progress Notes (Signed)
PROGRESS NOTE    Anna Hoffman  WLS:[id number] DOB: 1934/12/01 DOA: 05/22/2021 PCP: Glendale Chard, MD    Chief Complaint  Patient presents with   Emesis    Brief Narrative:  Anna Hoffman, 86 y.o. female with PMH of esophagitis, CKD stage III, HTN, colitis, hypertrophic cardiomyopathy and intracerebral hemorrhage, diabetes   presented to hospital with nausea vomiting and weakness for 2 to 3 days with difficulty ambulating and confusion for few weeks.  She also had a fall with some trauma to the right shoulder.  In the ED, patient was mildly tachycardic and hypertensive.  Creatinine was elevated at 1.82 from baseline 1.2.  There was mild leukocytosis but hemoglobin was 16.1.  X-ray of chest, CT head scan and pelvic scan were negative for acute findings except for left renal angiomyolipoma.  Patient received vancomycin cefepime and Flagyl in the ED and was admitted to hospital after IV fluid bolus.     At this time, cultures have been negative and  antibiotics have been discontinued after completion of 3-day course of cefepime.  Patient continues to be weak and debilitated and PT has recommended skilled nursing facility placement.  Cardiology has seen the patient  for her history of intermittent heart block and question hypertrophic cardiomyopathy and has been adjusted on medications. On  05/26/2021, patient had accelerated hypertension and was less responsive.  Neurology was consulted.  Concern for stroke and cefepime induced neurotoxicity.  MRI showed minute stroke.   Assessment & Plan:   Principal Problem:   SIRS (systemic inflammatory response syndrome) ([name]) Active Problems:   Benign essential HTN   Type 2 diabetes mellitus with stage 3 chronic kidney disease, without long-term current use of insulin ([name])   Mixed hyperlipidemia   Acute renal failure superimposed on stage 3a chronic kidney disease ([name])   Prolonged QT interval   Dehydration   HOCM (hypertrophic obstructive  cardiomyopathy) ([name])   History of CVA (cerebrovascular accident)   Sepsis ([place])   Malnutrition of moderate degree   Acute metabolic encephalopathy  1 SIRS, POA -On admission patient met criteria for SIRS with tachycardia, lactic acidosis, leukocytosis with no obvious source. -CT abdomen and pelvis done with no acute findings. -COVID-19 PCR negative, MRSA PCR negative, blood cultures negative x5 days. -Patient initially placed empirically on broad-spectrum antibiotics and received approximately 3 days worse which was subsequently discontinued due to concern for cefepime induced neurotoxicity and encephalopathy. -Currently being monitored off antibiotics. -Leukocytosis trending down. -Status post LP with no signs of bacterial meningitis or encephalitis.  -Continue to monitor off antibiotics.  2.  Acute metabolic encephalopathy -Questionable etiology. -May be secondary to acute/early subacute infarct and sinus tachycardia with HOCM. -Patient more alert, oriented to self place and time, following commands appropriately.  -Neurology doubts if acute/subacute infarct etiology of patient's metabolic encephalopathy. -LP done with no signs of meningitis or encephalitis. -Supportive care.  3.  Acute/early subacute infarct -MRI brain with 2 mm subacute/acute infarct on posterior right frontal lobe and 3 mm in the callosal splenium. -Patient noted to have weakness on the left upper and lower extremities. -Patient underwent MRA angiogram head and neck with no large vessel occlusion, moderate intracranial atherosclerotic disease. -Carotid Dopplers with no significant ICA stenosis, vertebral arteries with high resistant flow. -Patient with recent 2D echo done 04/01/2021 and as such not repeated. -Hemoglobin A1c 6.2. -Fasting lipid panel with LDL of 47. -Continue Crestor, amlodipine. -Patient started on aspirin for secondary stroke prophylaxis per neurology. -Neurology was following but  have signed  off as of 05/30/2021. -Outpatient follow-up with neurology.  4.  Acute kidney injury on chronic kidney disease stage IIIa -Baseline creatinine 1.2 peaked at 2.3. -Renal function improved with hydration.   -Creatinine at 1.83.   -Fluid bolus x1.   -Follow.   5.  Hypokalemia -Potassium at 4.0 -Follow.  6.  Metabolic acidosis -Fluctuating.   -Bicarb of 24 -Was on oral bicarb tablets.   -Follow.   7.  Sinus tachycardia -Was on IV Lopressor however transitioned to oral Lopressor per cardiology which patient is tolerating. -If patient oral intake worsens again will need to be placed back on IV Lopressor. -Per cardiology.  8.  Hypertension -Controlled on current regimen of Norvasc, metoprolol, hydralazine.  9.  History of second-degree AV block/first-degree AV block -Patient being followed by cardiology, continue beta-blocker.  10.HOCM -Being followed by cardiology who suspect LVOT obstruction and MR noted on examination. -Per cardiology avoid dehydration. -Continue beta-blocker. -IV fluids have been saline locked. -Appreciate cardiology input and recommendations.  11.  Well-controlled diabetes mellitus type 2 -Hemoglobin A1c 6.2 (05/28/2021). -CBG 107 -SSI.   12.  Hyperlipidemia -Statin.  53.  Recent fall/deconditioning/debility -Being followed by PT/OT who are recommending SNF placement. -TOC consulted for placement.  14.  Moderate protein calorie malnutrition -Continue nutritional supplementation.   -Patient was on a dysphagia 1 diet and advance to a heart healthy diet which she seems to be tolerating.   -Patient more alert, SLP to reevaluate.  15.  Right shoulder pain -Secondary to recent fall and history of retrocochlear. -Plain films of the right shoulder with no acute fracture. -Continue K pad, local analgesics. -Outpatient follow-up.    DVT prophylaxis: Lovenox. Code Status: Full Family Communication: No family at bedside.  Updated  patient. Disposition:   Status is: Inpatient  Remains inpatient appropriate because: Ongoing management of patient's cardiac issues and encephalopathy.  Unsafe discharge plan.       Consultants:  Neurology: Dr.Khaliqdina 05/27/2021 Cardiology: Dr. Johnsie Cancel 05/24/2021 Interventional radiology  Procedures:  Lumbar puncture under fluoroscopy, per IR, Dr. Ilda Foil 05/29/2021 CT abdomen and pelvis 05/22/2021 CT head without contrast 05/26/2021, 05/22/2021 Chest x-ray 05/22/2021 MRI C-spine 05/27/2021 MRI angiogram head 05/27/2021 MRI brain 05/27/2021 Renal ultrasound 05/28/2021 Carotid ultrasound 05/28/2021 EEG 05/27/2021     Antimicrobials:  IV cefepime 05/22/2021>>>>> 05/26/2021  IV Flagyl 05/22/2021>>>>> 05/24/2021 IV vancomycin 05/22/2021>>>>> 05/23/2021    Subjective: Laying in bed. No CP, No SOB. Tolerating current diet.   Objective: Vitals:   06/02/21 2013 06/03/21 0345 06/03/21 1006 06/03/21 1110  BP: (!) 150/73 (!) 158/86 (!) 152/81 (!) 143/83  Pulse: 98 (!) 110 (!) 104 95  Resp: '16 20  18  ' Temp: 98.7 F (37.1 C) 98.6 F (37 C)  98.3 F (36.8 C)  TempSrc: Oral Oral  Oral  SpO2: 94% 93%  91%  Weight:      Height:        Intake/Output Summary (Last 24 hours) at 06/03/2021 1139 Last data filed at 06/03/2021 1009 Gross per 24 hour  Intake 603 ml  Output 550 ml  Net 53 ml    Filed Weights   05/23/21 0237  Weight: 61.4 kg    Examination:  General exam: : NAD Respiratory system: Lungs clear to auscultation bilaterally.  No wheezes, no crackles, no rhonchi.  Normal respiratory effort.  Speaking in full sentences.  Fair air movement.  Cardiovascular system: RRR no murmurs rubs or gallops.  No JVD.  No lower extremity edema.  Gastrointestinal system: Abdomen  is soft, nontender, nondistended, positive bowel sounds.  No rebound.  No guarding. Central nervous system: Alert and oriented. No focal neurological deficits. Extremities: 5/5 right upper and  right lower extremity strength. 4-5/5/left upper and left lower extremity strength. Skin: No rashes, lesions or ulcers Psychiatry: Judgement and insight appear fair. Mood & affect appropriate.  Data Reviewed: I have personally reviewed following labs and imaging studies  CBC: Recent Labs  Lab 05/29/21 0446 05/30/21 0443 05/31/21 0418 06/01/21 0445 06/02/21 0501  WBC 16.4* 14.1* 11.5* 11.5* 11.5*  NEUTROABS  --  10.7* 8.4*  --   --   HGB 11.7* 10.8* 10.5* 11.0* 10.4*  HCT 35.8* 35.1* 33.3* 34.7* 32.6*  MCV 89.5 93.9 91.5 92.0 89.6  PLT 340 293 283 300 349     Basic Metabolic Panel: Recent Labs  Lab 05/29/21 0446 05/30/21 0443 05/31/21 0418 06/01/21 0445 06/02/21 0501 06/03/21 0451  NA 135 138 136 133* 135 134*  K 3.2* 3.3* 3.6 3.7 3.8 4.0  CL 109 110 107 105 103 101  CO2 16* 20* 22 19* 23 24  GLUCOSE 125* 102* 105* 94 100* 126*  BUN 56* 48* 36* 31* 31* 36*  CREATININE 2.38* 2.18* 1.78* 1.66* 1.57* 1.83*  CALCIUM 8.7* 8.8* 8.7* 8.7* 8.5* 8.6*  MG 2.4  --  2.1 2.0  --  2.2     GFR: Estimated Creatinine Clearance: 20.6 mL/min (A) (by C-G formula based on SCr of 1.83 mg/dL (H)).  Liver Function Tests: No results for input(s): AST, ALT, ALKPHOS, BILITOT, PROT, ALBUMIN in the last 168 hours.  CBG: Recent Labs  Lab 06/02/21 1133 06/02/21 1622 06/02/21 2136 06/03/21 0717 06/03/21 1107  GLUCAP 147* 183* 156* 107* 130*      Recent Results (from the past 240 hour(s))  CSF culture w Gram Stain     Status: None   Collection Time: 05/29/21  3:22 PM   Specimen: PATH Cytology CSF; Cerebrospinal Fluid  Result Value Ref Range Status   Specimen Description   Final    CSF Performed at [facility], [place] [address], [place], [place] [address]    Special Requests   Final    NONE Performed at [facility], [place] [address], [place], [place] [address]    Gram Stain   Final    NO WBC SEEN NO ORGANISMS SEEN CYTOSPIN SMEAR Gram  Stain Report Called to,Read Back By and Verified With: C.ACHUSIM, RN AT 8413 ON 10.26.22 BY N.Zoltan Genest Performed at [place] [address] [place], [place] [address]    Culture   Final    NO GROWTH 3 DAYS Performed at [facility], [place] [address] [place], [place] [address]    Report Status 06/02/2021 FINAL  Final          Radiology Studies: No results found.      Scheduled Meds:  amLODipine  10 mg Oral Daily   aspirin  81 mg Oral Daily   Chlorhexidine Gluconate Cloth  6 each Topical Daily   diclofenac Sodium  2 g Topical QID   enoxaparin (LOVENOX) injection  30 mg Subcutaneous Q24H   feeding supplement (KATE FARMS STANDARD 1.4)  325 mL Oral BID BM   hydrALAZINE  100 mg Oral Q8H   insulin aspart  0-9 Units Subcutaneous TID WC   metoprolol tartrate  12.5 mg Oral BID   multivitamin with minerals  1 tablet Oral Daily   pantoprazole  40 mg Oral BID  rosuvastatin  20 mg Oral Daily   sodium bicarbonate  650 mg Oral BID   sodium chloride flush  3 mL Intravenous Q12H   thiamine  100 mg Oral Daily   Continuous Infusions:     LOS: 11 days    Time spent: 35 minutes    Irine Seal, MD [facility]   To contact the attending provider between 7A-7P or the covering provider during after hours 7P-7A, please log into the web site [email or web address] and access using universal [place] password for that web site. If you do not have the password, please call the hospital operator.  06/03/2021, 11:39 AM

## 2021-06-03 NOTE — Plan of Care (Signed)
  Problem: Clinical Measurements: Goal: Will remain free from infection Outcome: Progressing Goal: Diagnostic test results will improve Outcome: Progressing Goal: Respiratory complications will improve Outcome: Progressing Goal: Cardiovascular complication will be avoided Outcome: Progressing   Problem: Activity: Goal: Risk for activity intolerance will decrease Outcome: Progressing   

## 2021-06-03 NOTE — TOC Progression Note (Signed)
Transition of Care [facility]) - Progression Note    Patient Details  Name: Jeneane Pieczynski MRN: [id number] Date of Birth: 08-19-1934  Transition of Care [facility]) CM/SW Contact  Aharon Carriere, Juliann Pulse, RN Phone Number: 06/03/2021, 10:13 AM  Clinical Narrative: Will send updated therapy notes once available. For auth to [facility].      Expected Discharge Plan: [place] Barriers to Discharge: Continued Medical Work up  Expected Discharge Plan and Services Expected Discharge Plan: [place]   Discharge Planning Services: CM Consult Post Acute Care Choice: [place] Living arrangements for the past 2 months: Single Family Home                                       Social Determinants of Health (SDOH) Interventions    Readmission Risk Interventions No flowsheet data found.

## 2021-06-03 NOTE — TOC Progression Note (Signed)
Transition of Care [facility]) - Progression Note    Patient Details  Name: Anna Hoffman MRN: [id number] Date of Birth: 01-08-1935  Transition of Care [facility]) CM/SW Contact  Diamon Reddinger, Juliann Pulse, RN Phone Number: 06/03/2021, 2:21 PM  Clinical Narrative:  Bernadene Bell additional clinicals sent.pending Josem Kaufmann [id number] auth for [facility].     Expected Discharge Plan: Skilled Nursing Facility Barriers to Discharge: [employer]  Expected Discharge Plan and Services Expected Discharge Plan: [place]   Discharge Planning Services: CM Consult Post Acute Care Choice: [place] Living arrangements for the past 2 months: Single Family Home                                       Social Determinants of Health (SDOH) Interventions    Readmission Risk Interventions No flowsheet data found.

## 2021-06-03 NOTE — Progress Notes (Signed)
Physical Therapy Treatment Patient Details Name: Anna Hoffman MRN: [id number] DOB: 1935-07-10 Today's Date: 06/03/2021   History of Present Illness Anna Hoffman, 86 y.o. female with PMH of esophagitis, CKD stage III, HTN, colitis, hypertrophic cardiomyopathy and intracerebral hemorrhage, diabetes   presented to hospital with nausea vomiting and weakness for 2 to 3 days with difficulty ambulating and confusion for few weeks.  She also had a fall with some trauma to the right shoulder.  In the ED, patient was mildly tachycardic and hypertensive.  Creatinine was elevated at 1.82 from baseline 1.2.  There was mild leukocytosis but hemoglobin was 16.1.  X-ray of chest, CT head scan and pelvic scan were negative for acute findings except for left renal angiomyolipoma.  Patient received vancomycin cefepime and Flagyl in the ED and was admitted to hospital after IV fluid bolus.       At this time, cultures have been negative and  antibiotics have been discontinued after completion of 3-day course of cefepime.  Patient continues to be weak and debilitated and PT has recommended skilled nursing facility placement.  Cardiology has seen the patient  for her history of intermittent heart block and question hypertrophic cardiomyopathy and has been adjusted on medications. On  05/26/2021, patient had accelerated hypertension and was less responsive.  Neurology was consulted.  Concern for stroke and cefepime induced neurotoxicity.  MRI showed minute stroke.    PT Comments    Pt just returned from Radiology for a MBBS in bed.  General Comments: AxO x 3 able to express needs and follow commands but overall feeling VERY "Tired" and "sluggish" as well as overall weakness.  Assisted OOB was difficult.  General bed mobility comments: pt initaiates and attempts but unable to complete due to profound weakness along with MAX c/o feeling "wore out".  Pt only able to self assist approx 15%.  Poor sitting balance once EOB.  LOB  all planes and too weak to hold self upright without use B UE's.  "sooooo tired".General transfer comment: pt unable to complete self sit to stand so assisted to [facility] via SPS "Bear Hug" 1/4 pivot.  Pt briefly able to support her own weight but then presents with a "collapsed" stand to sit.  Paerformed 2 transfers.  Bed to [facility] then BSC to recliner.  Pt unable to take any functional steps due to overall weakness/fatigue. General Gait Details: unable to attempt due to profound weakness and inability to take safe functional steps. Pt positioned in recliner to comfort when Son arrived. Due to pt's mobility decline, Pt will need ST Rehab at SNF prior to safely returning home with family.    Recommendations for follow up therapy are one component of a multi-disciplinary discharge planning process, led by the attending physician.  Recommendations may be updated based on patient status, additional functional criteria and insurance authorization.  Follow Up Recommendations  Skilled nursing-short term rehab (<3 hours/day)     Assistance Recommended at Discharge Frequent or constant Supervision/Assistance  Equipment Recommendations       Recommendations for Other Services       Precautions / Restrictions Precautions Precautions: Fall Precaution Comments: slow and sluggish but able to move L UE/LE     Mobility  Bed Mobility Overal bed mobility: Needs Assistance Bed Mobility: Supine to Sit     Supine to sit: Max assist;Total assist;+2 for physical assistance;+2 for safety/equipment     General bed mobility comments: pt initaiates and attempts but unable to complete due to  profound weakness along with MAX c/o feeling "wore out".  Pt only able to self assist approx 15%.  Poor sitting balance once EOB.  LOB all planes and too weak to hold self upright without use B UE's.  "sooooo tired".    Transfers Overall transfer level: Needs assistance Equipment used: None Transfers: Stand Pivot Transfers Sit  to Stand: Max assist;Total assist           General transfer comment: pt unable to complete self sit to stand so assisted to [facility] via SPS "Bear Hug" 1/4 pivot.  Pt briefly able to support her own weight but then presents with a "collapsed" stand to sit.  Paerformed 2 transfers.  Bed to [facility] then BSC to recliner.  Pt unable to take any functional steps due to overall weakness/fatigue.    Ambulation/Gait             General Gait Details: unable to attempt due to profound weakness and inability to take safe functional steps.   Stairs             Wheelchair Mobility    Modified Rankin (Stroke Patients Only)       Balance                                            Cognition Arousal/Alertness: Awake/alert Behavior During Therapy: WFL for tasks assessed/performed                                   General Comments: AxO x 3 able to express needs and follow commands but overall feeling VERY "Tired" and "sluggish" as well as overall weakness        Exercises      General Comments        Pertinent Vitals/Pain Pain Assessment: No/denies pain Pain Score: 0-No pain    Home Living                          Prior Function            PT Goals (current goals can now be found in the care plan section) Progress towards PT goals: Progressing toward goals    Frequency    Min 2X/week      PT Plan Current plan remains appropriate    Co-evaluation              AM-PAC PT "6 Clicks" Mobility   Outcome Measure  Help needed turning from your back to your side while in a flat bed without using bedrails?: Total Help needed moving from lying on your back to sitting on the side of a flat bed without using bedrails?: Total Help needed moving to and from a bed to a chair (including a wheelchair)?: Total Help needed standing up from a chair using your arms (e.g., wheelchair or bedside chair)?: Total Help needed to walk in  hospital room?: Total Help needed climbing 3-5 steps with a railing? : Total 6 Click Score: 6    End of Session Equipment Utilized During Treatment: Gait belt Activity Tolerance: Patient limited by fatigue Patient left: with call bell/phone within reach;with chair alarm set;with family/visitor present   PT Visit Diagnosis: Muscle weakness (generalized) (M62.81);Other symptoms and signs involving the nervous system (R29.898)  Time: 1305-1330 PT Time Calculation (min) (ACUTE ONLY): 25 min  Charges:  $Therapeutic Activity: 23-37 mins                     {Kyri Shader  PTA Acute  Rehabilitation Services Pager      [phone] Office      [phone]

## 2021-06-03 NOTE — Progress Notes (Signed)
Speech Language Pathology Treatment: Dysphagia  Patient Details Name: Anna Hoffman MRN: [id number] DOB: 1935/06/13 Today's Date: 06/03/2021 Time: 0940-1000 SLP Time Calculation (min) (ACUTE ONLY): 20 min  Assessment / Plan / Recommendation Clinical Impression  Patient seen to address dysphagia goals. New BSE ordered 10/30 and SLP acknowledged order however patient already on caseload. Most recent SLP session was 10/25 due to not being adequately alert. She had been on Dys 1, nectar thick liquids diet as per ST recommendations however currently she is on a regular solids, thin liquids diet. Per RN, patient was declining/refusing puree solids and diet consistencies were liberalized. during this SST session, patient very alert, feeding self a couple bites of scrambled eggs and sips of juice via straw. She was oriented to "hospital", year, and month. She did not initiate to perform actions and/or comment or request. She would respond to questions when directly asked and would pick up cup when handed to her and could then take straw sips. She exhibited mildly delayed non productive cough after one instance of straw sips of water, however other sip, even consecutive straw sips, did not consistently result in cough response. Patient did exhibit some hiccup/belching consistent with her h/o esophageal dysphagia. SLP is recommending to proceed with an objective swallow study to r/o aspiration.   HPI HPI: Pt is an 86 yo female adm to [facility] with N/V. Pt has h/o esophagitis, HH, CKD Stage 3A, cardiomyopathy, DM, colitis, HTN, right lobe pna, ICH. CT head Small chronic infarcts in the right  thalamus and left cerebellar hemisphere are unchanged. PT with several admits to hospital in last several months.  Most recent for fall.      SLP Plan  Continue with current plan of care;MBS      Recommendations for follow up therapy are one component of a multi-disciplinary discharge planning process, led by the attending  physician.  Recommendations may be updated based on patient status, additional functional criteria and insurance authorization.    Recommendations  Diet recommendations: Other(comment) (TBD after MBS) Liquids provided via: Straw Medication Administration: Crushed with puree Supervision: Full supervision/cueing for compensatory strategies;Staff to assist with self feeding Compensations: Slow rate;Small sips/bites;Minimize environmental distractions Postural Changes and/or Swallow Maneuvers: Seated upright 90 degrees;Upright 30-60 min after meal                Oral Care Recommendations: Oral care BID;Staff/trained caregiver to provide oral care Follow up Recommendations: Other (comment) (TBD) SLP Visit Diagnosis: Dysphagia, unspecified (R13.10) Plan: Continue with current plan of care;MBS       Sonia Baller, MA, CCC-SLP Speech Therapy

## 2021-06-04 ENCOUNTER — Inpatient Hospital Stay (HOSPITAL_COMMUNITY): Payer: Medicare Other

## 2021-06-04 DIAGNOSIS — R509 Fever, unspecified: Secondary | ICD-10-CM

## 2021-06-04 DIAGNOSIS — N390 Urinary tract infection, site not specified: Secondary | ICD-10-CM

## 2021-06-04 DIAGNOSIS — E8770 Fluid overload, unspecified: Secondary | ICD-10-CM

## 2021-06-04 LAB — BASIC METABOLIC PANEL
Anion gap: 7 (ref 5–15)
BUN: 34 mg/dL — ABNORMAL HIGH (ref 8–23)
CO2: 25 mmol/L (ref 22–32)
Calcium: 8.4 mg/dL — ABNORMAL LOW (ref 8.9–10.3)
Chloride: 101 mmol/L (ref 98–111)
Creatinine, Ser: 1.91 mg/dL — ABNORMAL HIGH (ref 0.44–1.00)
GFR, Estimated: 26 mL/min — ABNORMAL LOW (ref >60)
Glucose, Bld: 130 mg/dL — ABNORMAL HIGH (ref 70–99)
Potassium: 3.9 mmol/L (ref 3.5–5.1)
Sodium: 133 mmol/L — ABNORMAL LOW (ref 135–145)

## 2021-06-04 LAB — GLUCOSE, CAPILLARY
Glucose-Capillary: 143 mg/dL — ABNORMAL HIGH (ref 70–99)
Glucose-Capillary: 162 mg/dL — ABNORMAL HIGH (ref 70–99)
Glucose-Capillary: 109 mg/dL — ABNORMAL HIGH (ref 70–99)
Glucose-Capillary: 136 mg/dL — ABNORMAL HIGH (ref 70–99)

## 2021-06-04 LAB — MAGNESIUM: Magnesium: 2 mg/dL (ref 1.7–2.4)

## 2021-06-04 LAB — URINALYSIS, ROUTINE W REFLEX MICROSCOPIC
Bilirubin Urine: NEGATIVE
Glucose, UA: NEGATIVE mg/dL
Ketones, ur: NEGATIVE mg/dL
Nitrite: NEGATIVE
Protein, ur: NEGATIVE mg/dL
Specific Gravity, Urine: 1.009 (ref 1.005–1.030)
WBC, UA: >50 WBC/hpf — ABNORMAL HIGH (ref 0–5)
pH: 8 (ref 5.0–8.0)

## 2021-06-04 MED ORDER — FUROSEMIDE 10 MG/ML IJ SOLN
20.0000 mg | Freq: Every day | INTRAMUSCULAR | Status: AC
  Administered 2021-06-04: 20 mg via INTRAVENOUS
  Filled 2021-06-04 (×3): qty 2

## 2021-06-04 MED ORDER — SODIUM CHLORIDE 0.9 % IV SOLN
1.0000 g | INTRAVENOUS | Status: DC
  Administered 2021-06-04 – 2021-06-05 (×2): 1 g via INTRAVENOUS
  Filled 2021-06-04 (×3): qty 10

## 2021-06-04 NOTE — Progress Notes (Addendum)
PROGRESS NOTE    Anna Hoffman  HQI:[id number] DOB: 1934/12/28 DOA: 05/22/2021 PCP: Glendale Chard, MD    Chief Complaint  Patient presents with   Emesis    Brief Narrative:  Anna Hoffman, 86 y.o. female with PMH of esophagitis, CKD stage III, HTN, colitis, hypertrophic cardiomyopathy and intracerebral hemorrhage, diabetes   presented to hospital with nausea vomiting and weakness for 2 to 3 days with difficulty ambulating and confusion for few weeks.  She also had a fall with some trauma to the right shoulder.  In the ED, patient was mildly tachycardic and hypertensive.  Creatinine was elevated at 1.82 from baseline 1.2.  There was mild leukocytosis but hemoglobin was 16.1.  X-ray of chest, CT head scan and pelvic scan were negative for acute findings except for left renal angiomyolipoma.  Patient received vancomycin cefepime and Flagyl in the ED and was admitted to hospital after IV fluid bolus.     At this time, cultures have been negative and  antibiotics have been discontinued after completion of 3-day course of cefepime.  Patient continues to be weak and debilitated and PT has recommended skilled nursing facility placement.  Cardiology has seen the patient  for her history of intermittent heart block and question hypertrophic cardiomyopathy and has been adjusted on medications. On  05/26/2021, patient had accelerated hypertension and was less responsive.  Neurology was consulted.  Concern for stroke and cefepime induced neurotoxicity.  MRI showed minute stroke.   Assessment & Plan:   Principal Problem:   SIRS (systemic inflammatory response syndrome) ([name]) Active Problems:   Benign essential HTN   Type 2 diabetes mellitus with stage 3 chronic kidney disease, without long-term current use of insulin ([name])   Mixed hyperlipidemia   Acute renal failure superimposed on stage 3a chronic kidney disease ([name])   Prolonged QT interval   Dehydration   HOCM (hypertrophic obstructive  cardiomyopathy) ([name])   History of CVA (cerebrovascular accident)   Sepsis ([place])   Malnutrition of moderate degree   Acute metabolic encephalopathy  1 SIRS, POA -On admission patient met criteria for SIRS with tachycardia, lactic acidosis, leukocytosis with no obvious source. -CT abdomen and pelvis done with no acute findings. -COVID-19 PCR negative, MRSA PCR negative, blood cultures negative x5 days. -Patient initially placed empirically on broad-spectrum antibiotics and received approximately 3 days worse which was subsequently discontinued due to concern for cefepime induced neurotoxicity and encephalopathy. -Currently being monitored off antibiotics. -Leukocytosis trended down. -Status post LP with no signs of bacterial meningitis or encephalitis.  -Continue to monitor off antibiotics.  2.  Acute metabolic encephalopathy -Questionable etiology. -May be secondary to acute/early subacute infarct and sinus tachycardia with HOCM. -Patient more alert, oriented to self place and time, following commands appropriately.  -Neurology doubts if acute/subacute infarct etiology of patient's metabolic encephalopathy. -LP done with no signs of meningitis or encephalitis. -Supportive care.  3.  Acute/early subacute infarct -MRI brain with 2 mm subacute/acute infarct on posterior right frontal lobe and 3 mm in the callosal splenium. -Patient noted to have weakness on the left upper and lower extremities. -Patient underwent MRA angiogram head and neck with no large vessel occlusion, moderate intracranial atherosclerotic disease. -Carotid Dopplers with no significant ICA stenosis, vertebral arteries with high resistant flow. -Patient with recent 2D echo done 04/01/2021 and as such not repeated. -Hemoglobin A1c 6.2. -Fasting lipid panel with LDL of 47. -Continue Crestor, amlodipine. -Continue aspirin for secondary stroke prophylaxis.  -Neurology was following but have signed off  as of  05/30/2021. -Outpatient follow-up with neurology.  4.  Acute kidney injury on chronic kidney disease stage IIIa -Baseline creatinine 1.2 peaked at 2.3. -Renal function improved with hydration.   -Creatinine at 1.91.   -Patient with concerns for possible volume overload and as such IV fluids have been saline locked. -Lasix 20 mg IV x1. -Follow.   5.  Hypokalemia -Repleted.  Potassium at 3.9.   -Patient to receive a dose of IV Lasix today, repeat labs in the morning.   -Follow.  6.  Metabolic acidosis -Fluctuating.   -Bicarb of 25 -Was on oral bicarb tablets.   -Follow.   7.  Sinus tachycardia -Was on IV Lopressor however transitioned to oral Lopressor per cardiology which patient is tolerating. -If patient oral intake worsens again will need to be placed back on IV Lopressor. -Per cardiology.  8.  Hypertension -Continue Norvasc, hydralazine, metoprolol.   9.  History of second-degree AV block/first-degree AV block -Patient was being followed by cardiology, continue beta-blocker.  10.HOCM -Being followed by cardiology who suspect LVOT obstruction and MR noted on examination. -Per cardiology avoid dehydration. -Continue beta-blocker. -IV fluids have been saline locked. -Patient to receive Lasix 20 mg IV x1 -Appreciate cardiology input and recommendations. -Outpatient follow-up with cardiology.  11.  Well-controlled diabetes mellitus type 2 -Hemoglobin A1c 6.2 (05/28/2021). -CBG 109. -SSI.   12.  Hyperlipidemia -Continue statin.  46.  Recent fall/deconditioning/debility -Being followed by PT/OT who are recommending SNF placement. -TOC consulted for placement.  14.  Moderate protein calorie malnutrition -Continue nutritional supplementation.   -Patient was on a dysphagia 1 diet and advance to a heart healthy diet which she seems to be tolerating.   -Patient more alert, SLP following.   15.  Right shoulder pain -Secondary to recent fall and history of  retrocochlear. -Plain films of the right shoulder with no acute fracture. -Continue K pad, local analgesics. -Outpatient follow-up.  16.  Fever/probable UTI -Patient noted to have low-grade temps of 100.4 early on this morning. -Chest x-ray done with cardiomegaly, marked prominence of central pulmonary vessels with increased interstitial markings both lungs concerning for CHF with pulmonary edema. -DC Foley catheter -Urinalysis with large leukocytes, nitrite negative, rare bacteria, > 50 WBC. -Place empirically on IV Rocephin pending urine culture results.  17.  Volume overload -Patient noted to have low-grade temp, chest x-ray done concerning for pulmonary edema. -Patient with some bibasilar crackles. -We will give a dose of Lasix 20 mg IV daily x 2 doses. -Patient with HOCM and per cardiology to avoid dehydration. -Reassess in the a.m.    DVT prophylaxis: Lovenox. Code Status: Full Family Communication: No family at bedside.  Updated patient. Disposition:   Status is: Inpatient  Remains inpatient appropriate because: Ongoing management of patient's cardiac issues and encephalopathy.  Unsafe discharge plan.       Consultants:  Neurology: Dr.Khaliqdina 05/27/2021 Cardiology: Dr. Johnsie Cancel 05/24/2021 Interventional radiology  Procedures:  Lumbar puncture under fluoroscopy, per IR, Dr. Ilda Foil 05/29/2021 CT abdomen and pelvis 05/22/2021 CT head without contrast 05/26/2021, 05/22/2021 Chest x-ray 05/22/2021, 06/04/2021 MRI C-spine 05/27/2021 MRI angiogram head 05/27/2021 MRI brain 05/27/2021 Renal ultrasound 05/28/2021 Carotid ultrasound 05/28/2021 EEG 05/27/2021     Antimicrobials:  IV cefepime 05/22/2021>>>>> 05/26/2021  IV Flagyl 05/22/2021>>>>> 05/24/2021 IV vancomycin 05/22/2021>>>>> 05/23/2021    Subjective: Sitting up in chair eating lunch.  Denies any chest pain.  No shortness of breath.  Patient noted to have low-grade fever this morning with temp of  100.4 around 5  AM.  Patient denies any significant productive cough.  Objective: Vitals:   06/03/21 1110 06/03/21 1410 06/04/21 0504 06/04/21 0838  BP: (!) 143/83 128/83 (!) 157/85 (!) 154/71  Pulse: 95  (!) 104 (!) 103  Resp: '18  18 18  ' Temp: 98.3 F (36.8 C)  (!) 100.4 F (38 C) 98.9 F (37.2 C)  TempSrc: Oral  Oral Oral  SpO2: 91%  93%   Weight:      Height:        Intake/Output Summary (Last 24 hours) at 06/04/2021 1151 Last data filed at 06/04/2021 1000 Gross per 24 hour  Intake 360 ml  Output 700 ml  Net -340 ml    Filed Weights   05/23/21 0237  Weight: 61.4 kg    Examination:  General exam: : NAD Respiratory system: Some bibasilar crackles.  No wheezing.  No rhonchi.  Fair air movement.  Speaking in full sentences.  No use of accessory muscles of respiration. Cardiovascular system: RRR no murmurs rubs or gallops.  No JVD.  No lower extremity edema. Gastrointestinal system: Abdomen is soft, nontender, nondistended, positive bowel sounds.  No rebound.  No guarding.   Central nervous system: Alert and oriented. No focal neurological deficits. Extremities: 5/5 right upper and right lower extremity strength. 4-5/5/left upper and left lower extremity strength. Skin: No rashes, lesions or ulcers Psychiatry: Judgement and insight appear fair. Mood & affect appropriate.  Data Reviewed: I have personally reviewed following labs and imaging studies  CBC: Recent Labs  Lab 05/29/21 0446 05/30/21 0443 05/31/21 0418 06/01/21 0445 06/02/21 0501  WBC 16.4* 14.1* 11.5* 11.5* 11.5*  NEUTROABS  --  10.7* 8.4*  --   --   HGB 11.7* 10.8* 10.5* 11.0* 10.4*  HCT 35.8* 35.1* 33.3* 34.7* 32.6*  MCV 89.5 93.9 91.5 92.0 89.6  PLT 340 293 283 300 349     Basic Metabolic Panel: Recent Labs  Lab 05/29/21 0446 05/30/21 0443 05/31/21 0418 06/01/21 0445 06/02/21 0501 06/03/21 0451 06/04/21 0429  NA 135   < > 136 133* 135 134* 133*  K 3.2*   < > 3.6 3.7 3.8 4.0 3.9  CL 109    < > 107 105 103 101 101  CO2 16*   < > 22 19* '23 24 25  ' GLUCOSE 125*   < > 105* 94 100* 126* 130*  BUN 56*   < > 36* 31* 31* 36* 34*  CREATININE 2.38*   < > 1.78* 1.66* 1.57* 1.83* 1.91*  CALCIUM 8.7*   < > 8.7* 8.7* 8.5* 8.6* 8.4*  MG 2.4  --  2.1 2.0  --  2.2 2.0   < > = values in this interval not displayed.     GFR: Estimated Creatinine Clearance: 19.7 mL/min (A) (by C-G formula based on SCr of 1.91 mg/dL (H)).  Liver Function Tests: No results for input(s): AST, ALT, ALKPHOS, BILITOT, PROT, ALBUMIN in the last 168 hours.  CBG: Recent Labs  Lab 06/03/21 1107 06/03/21 1652 06/03/21 2118 06/04/21 0751 06/04/21 1130  GLUCAP 130* 143* 162* 109* 136*      Recent Results (from the past 240 hour(s))  CSF culture w Gram Stain     Status: None   Collection Time: 05/29/21  3:22 PM   Specimen: PATH Cytology CSF; Cerebrospinal Fluid  Result Value Ref Range Status   Specimen Description   Final    CSF Performed at [facility], [place] [address] [place], [place] [address]  Special Requests   Final    NONE Performed at [facility], [place] [address], [place], [place] [address]    Gram Stain   Final    NO WBC SEEN NO ORGANISMS SEEN CYTOSPIN SMEAR Gram Stain Report Called to,Read Back By and Verified With: C.ACHUSIM, RN AT 0932 ON 10.26.22 BY N.Carollyn Etcheverry Performed at [place] [address], [place], [place] [address]    Culture   Final    NO GROWTH 3 DAYS Performed at [facility], [place] [address], [place], [place] [address]    Report Status 06/02/2021 FINAL  Final  Resp Panel by RT-PCR (Flu A&B, Covid) Nasopharyngeal Swab     Status: None   Collection Time: 06/03/21 12:53 PM   Specimen: Nasopharyngeal Swab; Nasopharyngeal(NP) swabs in vial transport medium  Result Value Ref Range Status   SARS Coronavirus 2 by RT PCR NEGATIVE NEGATIVE Final    Comment: (NOTE) SARS-CoV-2 target nucleic acids are NOT  DETECTED.  The SARS-CoV-2 RNA is generally detectable in upper respiratory specimens during the acute phase of infection. The lowest concentration of SARS-CoV-2 viral copies this assay can detect is 138 copies/mL. A negative result does not preclude SARS-Cov-2 infection and should not be used as the sole basis for treatment or other patient management decisions. A negative result may occur with  improper specimen collection/handling, submission of specimen other than nasopharyngeal swab, presence of viral mutation(s) within the areas targeted by this assay, and inadequate number of viral copies(<138 copies/mL). A negative result must be combined with clinical observations, patient history, and epidemiological information. The expected result is Negative.  Fact Sheet for Patients:  EntrepreneurPulse.com.au  Fact Sheet for Healthcare Providers:  IncredibleEmployment.be  This test is no t yet approved or cleared by the [place] FDA and  has been authorized for detection and/or diagnosis of SARS-CoV-2 by FDA under an Emergency Use Authorization (EUA). This EUA will remain  in effect (meaning this test can be used) for the duration of the COVID-19 declaration under Section 564(b)(1) of the Act, 21 U.S.C.section 360bbb-3(b)(1), unless the authorization is terminated  or revoked sooner.       Influenza A by PCR NEGATIVE NEGATIVE Final   Influenza B by PCR NEGATIVE NEGATIVE Final    Comment: (NOTE) The Xpert Xpress SARS-CoV-2/FLU/RSV plus assay is intended as an aid in the diagnosis of influenza from Nasopharyngeal swab specimens and should not be used as a sole basis for treatment. Nasal washings and aspirates are unacceptable for Xpert Xpress SARS-CoV-2/FLU/RSV testing.  Fact Sheet for Patients: EntrepreneurPulse.com.au  Fact Sheet for Healthcare Providers: IncredibleEmployment.be  This test is not yet  approved or cleared by the [place] FDA and has been authorized for detection and/or diagnosis of SARS-CoV-2 by FDA under an Emergency Use Authorization (EUA). This EUA will remain in effect (meaning this test can be used) for the duration of the COVID-19 declaration under Section 564(b)(1) of the Act, 21 U.S.C. section 360bbb-3(b)(1), unless the authorization is terminated or revoked.  Performed at [facility], [place] [address], [place],  [address]           Radiology Studies: DG Chest 2 View  Result Date: 06/04/2021 CLINICAL DATA:  Fever EXAM: CHEST - 2 VIEW COMPARISON:  Previous studies including the examination of 05/22/2021 FINDINGS: Transverse diameter of heart is increased thoracic aorta is tortuous and ectatic. There is marked prominence of central pulmonary vessels. Increased interstitial markings are seen in the parahilar  regions and lower lung fields. There is poor inspiration. There is blunting of both lateral CP angles. There is no pneumothorax. IMPRESSION: Cardiomegaly. There is marked prominence of central pulmonary vessels along with increased interstitial markings in both lungs suggesting CHF with pulmonary edema. Possibility of underlying pneumonia is not excluded. Small bilateral pleural effusions are seen. Electronically Signed   By: Elmer Picker M.D.   On: 06/04/2021 11:09   DG Swallowing Func-Speech Pathology  Result Date: 06/03/2021 Table formatting from the original result was not included. Objective Swallowing Evaluation: Type of Study: MBS-Modified Barium Swallow Study  Patient Details Name: Odyssey Vasbinder MRN: [id number] Date of Birth: 1934-12-28 Today's Date: 06/03/2021 Time: SLP Start Time (ACUTE ONLY): 7510 -SLP Stop Time (ACUTE ONLY): 2585 SLP Time Calculation (min) (ACUTE ONLY): 20 min Past Medical History: Past Medical History: Diagnosis Date  Arthritis   Diabetes mellitus without complication ([place])   Hypertension    Hypokalemia   Pneumonia   Shingles   Stroke [facility])  Past Surgical History: Past Surgical History: Procedure Laterality Date  ABDOMINAL HYSTERECTOMY    BREAST EXCISIONAL BIOPSY Left   ESOPHAGOGASTRODUODENOSCOPY N/A 09/01/2014  Procedure: ESOPHAGOGASTRODUODENOSCOPY (EGD);  Surgeon: Lear Ng, MD;  Location: [facility];  Service: Endoscopy;  Laterality: N/A;  ESOPHAGOGASTRODUODENOSCOPY (EGD) WITH PROPOFOL N/A 10/12/2020  Procedure: ESOPHAGOGASTRODUODENOSCOPY (EGD) WITH PROPOFOL;  Surgeon: Mauri Pole, MD;  Location: [facility];  Service: Endoscopy;  Laterality: N/A;  HIATAL HERNIA REPAIR N/A 09/04/2014  Procedure: LAPAROSCOPIC REPAIR OF HIATAL HERNIA;  Surgeon: Excell Seltzer, MD;  Location: [facility];  Service: General;  Laterality: N/A;  With MESH  IR GENERIC HISTORICAL  04/10/2016  IR US GUIDE VASC ACCESS RIGHT 04/10/2016 Corrie Mckusick, DO [facility]  IR GENERIC HISTORICAL  04/10/2016  IR ANGIOGRAM SELECTIVE EACH ADDITIONAL VESSEL 04/10/2016 Corrie Mckusick, DO [facility]  IR GENERIC HISTORICAL  04/10/2016  IR ANGIOGRAM SELECTIVE EACH ADDITIONAL VESSEL 04/10/2016 Corrie Mckusick, DO [facility]  IR GENERIC HISTORICAL  04/10/2016  IR EMBO TUMOR ORGAN ISCHEMIA INFARCT INC GUIDE ROADMAPPING 04/10/2016 Corrie Mckusick, DO [facility]  IR GENERIC HISTORICAL  04/10/2016  IR ANGIOGRAM SELECTIVE EACH ADDITIONAL VESSEL 04/10/2016 Corrie Mckusick, DO [facility]  IR GENERIC HISTORICAL  04/10/2016  IR ANGIOGRAM SELECTIVE EACH ADDITIONAL VESSEL 04/10/2016 Corrie Mckusick, DO [facility]  IR GENERIC HISTORICAL  04/10/2016  IR RENAL SELECTIVE  UNI INC S&I MOD SED 04/10/2016 Corrie Mckusick, DO [facility]  IR GENERIC HISTORICAL  03/06/2016  IR RADIOLOGIST EVAL & MGMT 03/06/2016 Corrie Mckusick, DO [facility]  IR GENERIC HISTORICAL  03/26/2016  IR RADIOLOGIST EVAL & MGMT 03/26/2016 Corrie Mckusick, DO [facility]  IR GENERIC HISTORICAL  04/29/2016  IR RADIOLOGIST EVAL & MGMT 04/29/2016 [facility]  IR RADIOLOGIST EVAL & MGMT  11/12/2016  IR  RADIOLOGIST EVAL & MGMT  12/16/2017  KNEE SURGERY    TONSILLECTOMY   HPI: Pt is an 86 yo female adm to [facility] with N/V. Pt has h/o esophagitis, HH, CKD Stage 3A, cardiomyopathy, DM, colitis, HTN, right lobe pna, ICH. CT head Small chronic infarcts in the right  thalamus and left cerebellar hemisphere are unchanged. PT with several admits to hospital in last several months.  Most recent for fall.  Subjective: alert, cooperative Assessment / Plan / Recommendation CHL IP CLINICAL IMPRESSIONS 06/03/2021 Clinical Impression Patient presents with a mild oropharyngeal dysphagia as per this MBS. During oral phase of swallow, she exhibited decreased mastication and piecemeal swallowing with regular solids and mildly delayed anterior  to posterior transit with puree solids. During pharyngeal phase of swallow, patient exhibited swallow initiation delay to level of vallecular sinus with puree and regular texture solids and swallow initiation delay to level of pyriform sinus with thin liquids. Airway was well protected and no evidence of penetration or aspiration of thin liquid, puree or regular texture solids boluses observed. Trace to mild oral and trace pharyngeal (vallecular sinus) residuals observed with solids however these cleared with subsequent swallows. Patient did exhibit a few instances of cough response, however this after swallows when fluro was turned off. Radiology technician turned fluro back on for two of these 3 instances, however no evidence of barium in pharynx, laryngeal vestibule or trachea. Cricopharyngeal bar observed however this did not appear to significantly impede bolus transit. No observed esophageal backflow observed and no barium retention observed in upper esophagus (upper thoracic level). SLP is recommending patient be downgraded slightly to Dys 3 solids, continue with thin liquids. SLP Visit Diagnosis Dysphagia, oropharyngeal phase (R13.12) Attention and concentration deficit following -- Frontal  lobe and executive function deficit following -- Impact on safety and function Mild aspiration risk   CHL IP TREATMENT RECOMMENDATION 06/03/2021 Treatment Recommendations Therapy as outlined in treatment plan below   Prognosis 06/03/2021 Prognosis for Safe Diet Advancement Good Barriers to Reach Goals -- Barriers/Prognosis Comment -- CHL IP DIET RECOMMENDATION 06/03/2021 SLP Diet Recommendations Thin liquid Liquid Administration via Cup;Straw Medication Administration Whole meds with liquid Compensations Slow rate;Small sips/bites;Minimize environmental distractions;Follow solids with liquid Postural Changes Remain semi-upright after after feeds/meals (Comment);Seated upright at 90 degrees   CHL IP OTHER RECOMMENDATIONS 06/03/2021 Recommended Consults -- Oral Care Recommendations Oral care BID;Staff/trained caregiver to provide oral care Other Recommendations --   CHL IP FOLLOW UP RECOMMENDATIONS 06/03/2021 Follow up Recommendations None   CHL IP FREQUENCY AND DURATION 06/03/2021 Speech Therapy Frequency (ACUTE ONLY) min 1 x/week Treatment Duration 1 week      CHL IP ORAL PHASE 06/03/2021 Oral Phase Impaired Oral - Pudding Teaspoon -- Oral - Pudding Cup -- Oral - Honey Teaspoon -- Oral - Honey Cup -- Oral - Nectar Teaspoon -- Oral - Nectar Cup -- Oral - Nectar Straw -- Oral - Thin Teaspoon -- Oral - Thin Cup WFL Oral - Thin Straw WFL Oral - Puree Weak lingual manipulation;Reduced posterior propulsion Oral - Mech Soft Weak lingual manipulation;Reduced posterior propulsion;Impaired mastication;Piecemeal swallowing Oral - Regular -- Oral - Multi-Consistency -- Oral - Pill WFL Oral Phase - Comment --  CHL IP PHARYNGEAL PHASE 06/03/2021 Pharyngeal Phase Impaired Pharyngeal- Pudding Teaspoon -- Pharyngeal -- Pharyngeal- Pudding Cup -- Pharyngeal -- Pharyngeal- Honey Teaspoon -- Pharyngeal -- Pharyngeal- Honey Cup -- Pharyngeal -- Pharyngeal- Nectar Teaspoon -- Pharyngeal -- Pharyngeal- Nectar Cup -- Pharyngeal --  Pharyngeal- Nectar Straw -- Pharyngeal -- Pharyngeal- Thin Teaspoon -- Pharyngeal -- Pharyngeal- Thin Cup Delayed swallow initiation-pyriform sinuses Pharyngeal -- Pharyngeal- Thin Straw Delayed swallow initiation-pyriform sinuses Pharyngeal -- Pharyngeal- Puree Delayed swallow initiation-vallecula Pharyngeal -- Pharyngeal- Mechanical Soft Delayed swallow initiation-vallecula;Pharyngeal residue - valleculae Pharyngeal -- Pharyngeal- Regular -- Pharyngeal -- Pharyngeal- Multi-consistency -- Pharyngeal -- Pharyngeal- Pill WFL Pharyngeal -- Pharyngeal Comment --  CHL IP CERVICAL ESOPHAGEAL PHASE 06/03/2021 Cervical Esophageal Phase Impaired Pudding Teaspoon -- Pudding Cup -- Honey Teaspoon -- Honey Cup -- Nectar Teaspoon -- Nectar Cup -- Nectar Straw -- Thin Teaspoon -- Thin Cup Prominent cricopharyngeal segment Thin Straw Prominent cricopharyngeal segment Puree Prominent cricopharyngeal segment Mechanical Soft -- Regular Prominent cricopharyngeal segment Multi-consistency -- Pill Prominent cricopharyngeal segment Cervical Esophageal  Comment -- Sonia Baller, MA, CCC-SLP Speech Therapy                   Scheduled Meds:  amLODipine  10 mg Oral Daily   aspirin  81 mg Oral Daily   Chlorhexidine Gluconate Cloth  6 each Topical Daily   diclofenac Sodium  2 g Topical QID   enoxaparin (LOVENOX) injection  30 mg Subcutaneous Q24H   feeding supplement ([employer] STANDARD 1.4)  325 mL Oral BID BM   furosemide  20 mg Intravenous Daily   hydrALAZINE  100 mg Oral Q8H   insulin aspart  0-9 Units Subcutaneous TID WC   metoprolol tartrate  12.5 mg Oral BID   multivitamin with minerals  1 tablet Oral Daily   pantoprazole  40 mg Oral BID   rosuvastatin  20 mg Oral Daily   sodium bicarbonate  650 mg Oral BID   sodium chloride flush  3 mL Intravenous Q12H   thiamine  100 mg Oral Daily   Continuous Infusions:  cefTRIAXone (ROCEPHIN)  IV        LOS: 12 days    Time spent: 35 minutes    Irine Seal,  MD [facility]   To contact the attending provider between 7A-7P or the covering provider during after hours 7P-7A, please log into the web site [email or web address] and access using universal [place] password for that web site. If you do not have the password, please call the hospital operator.  06/04/2021, 11:51 AM

## 2021-06-04 NOTE — Progress Notes (Signed)
The patient was able to void successfully, 400 cc. Pure wick will remain in place. Will continue to monitor.

## 2021-06-04 NOTE — Progress Notes (Signed)
Occupational Therapy Treatment Patient Details Name: Anna Hoffman MRN: [id number] DOB: [birth year] Today's Date: 06/04/2021   History of present illness 86 y.o. female presenting to ED with N/V and weakness x 2-3 days with mutliple falls resulting in trauma to R shoulder (followed by outpatient PT). Patient found to have SIRS. PMH significant for esophagitis, CKD stage IIIa, hypertension, colitis, hypertrophic cardiomyopathy and intracerebral hemorrhage, diabetes.   OT comments  OT treatment session with focus on self-care re-education, sitting/standing balance, activity tolerance and functional transfers. Patient completed seated grooming tasks with Min A to manage ADL containers and to maintain static sitting balance. Max A +2 for sit to stand x2 trials from EOB with increased time to rise and max multimodal cues for upright posture secondary to anterior bias. Patient also able to complete stand-pivot to recliner with Max A +2 and external assist to advance LLE. Patient continues to require Min to Max A+2 for ADLs grossly and would benefit from continued acute OT services in prep for safe d/c to next level of care. Recommendation for SNF rehab remains appropriate.    Recommendations for follow up therapy are one component of a multi-disciplinary discharge planning process, led by the attending physician.  Recommendations may be updated based on patient status, additional functional criteria and insurance authorization.    Follow Up Recommendations  Skilled nursing-short term rehab (<3 hours/day)    Assistance Recommended at Discharge Frequent or constant Supervision/Assistance  Equipment Recommendations  Other (comment) (Defer to next level of care)    Recommendations for Other Services      Precautions / Restrictions Precautions Precautions: Fall Precaution Comments: Hx of falls; high fall risk Restrictions Weight Bearing Restrictions: No       Mobility Bed Mobility Overal bed  mobility: Needs Assistance Bed Mobility: Supine to Sit     Supine to sit: Min assist     General bed mobility comments: Patient able to advance BLE from bed surface to EOB without external assist. Min A to elevate trunk and Min A for anterior scoot toward EOB with use of chuck pad.    Transfers Overall transfer level: Needs assistance Equipment used: None Transfers: Stand Pivot Transfers Sit to Stand: Max assist;+2 physical assistance;+2 safety/equipment           General transfer comment: Max A +2 with tactile cues and external assist for progression of LLE. Cues for upright posture 2/2 flexed trunk and anterior bias.     Balance Overall balance assessment: Needs assistance Sitting-balance support: Bilateral upper extremity supported Sitting balance-Leahy Scale: Poor Sitting balance - Comments: Min A at most and Min guard at best to maintain static sitting balance at EOB during seated grooming tasks. Able to self-correct with max multimodal cues. Postural control: Right lateral lean   Standing balance-Leahy Scale: Poor Standing balance comment: Reliant on BUE on RW and +2 external assist.                           ADL either performed or assessed with clinical judgement   ADL Overall ADL's : Needs assistance/impaired     Grooming: Minimal assistance;Sitting Grooming Details (indicate cue type and reason): Min A for completion of hair combing task but able to initiate with use of RUE this date. Min A to manage ADL containers 2/2 decreased [place]. Also requires Min gaurd to Min A to maintain static sitting balance at EOB during task.  Lower Body Dressing: Maximal assistance;+2 for physical assistance;+2 for safety/equipment;Sit to/from stand   Toilet Transfer: Maximal assistance;+2 for physical assistance;+2 for safety/equipment;Rolling walker (2 wheels) Toilet Transfer Details (indicate cue type and reason): Simulated with transfer to recliner with  Max A +2, use of RW and max multimodal cues for posture and hand/foot placement.                 Vision       Perception     Praxis      Cognition Arousal/Alertness: Awake/alert Behavior During Therapy: WFL for tasks assessed/performed Overall Cognitive Status: History of cognitive impairments - at baseline Area of Impairment: Following commands                 Orientation Level: Place;Time;Situation     Following Commands: Follows one step commands inconsistently;Follows one step commands with increased time     Problem Solving: Requires verbal cues;Requires tactile cues;Difficulty sequencing;Slow processing;Decreased initiation General Comments: A&O to person, place and time. Disoriented to situation. Patient requires increased time to process verbal information. Slow to respond.          Exercises     Shoulder Instructions       General Comments VSS on RA.    Pertinent Vitals/ Pain       Pain Assessment: Faces Faces Pain Scale: Hurts little more Pain Location: Rt shoulder Pain Descriptors / Indicators: Grimacing Pain Intervention(s): Limited activity within patient's tolerance;Monitored during session;Repositioned  Home Living                                          Prior Functioning/Environment              Frequency  Min 2X/week        Progress Toward Goals  OT Goals(current goals can now be found in the care plan section)  Progress towards OT goals: Progressing toward goals  Acute Rehab OT Goals OT Goal Formulation: With family Time For Goal Achievement: 06/07/21 Potential to Achieve Goals: Fair ADL Goals Pt Will Perform Grooming: with set-up;with supervision;sitting Pt Will Perform Upper Body Bathing: with set-up;with supervision;sitting Pt Will Transfer to Toilet: ambulating;regular height toilet;grab bars;with min guard assist Pt Will Perform Toileting - Clothing Manipulation and hygiene: with mod  assist;sit to/from stand  Plan Discharge plan remains appropriate;Frequency remains appropriate    Co-evaluation                 AM-PAC OT "6 Clicks" Daily Activity     Outcome Measure   Help from another person eating meals?: A Little Help from another person taking care of personal grooming?: A Little Help from another person toileting, which includes using toliet, bedpan, or urinal?: A Lot Help from another person bathing (including washing, rinsing, drying)?: A Lot Help from another person to put on and taking off regular upper body clothing?: A Lot Help from another person to put on and taking off regular lower body clothing?: A Lot 6 Click Score: 14    End of Session Equipment Utilized During Treatment: Gait belt;Rolling walker (2 wheels)  OT Visit Diagnosis: Unsteadiness on feet (R26.81);Muscle weakness (generalized) (M62.81);Pain Pain - Right/Left: Right Pain - part of body: Shoulder   Activity Tolerance Patient tolerated treatment well   Patient Left in chair;with call bell/phone within reach;with chair alarm set   Nurse Communication Mobility status  Time: 8288-3374 OT Time Calculation (min): 26 min  Charges: OT General Charges $OT Visit: 1 Visit OT Treatments $Self Care/Home Management : 8-22 mins $Therapeutic Activity: 8-22 mins  Donika Butner H. OTR/L Supplemental OT, Department of rehab services [phone]  Clemente Dewey R H. 06/04/2021, 12:04 PM

## 2021-06-05 DIAGNOSIS — N172 Acute kidney failure with medullary necrosis: Secondary | ICD-10-CM

## 2021-06-05 LAB — BASIC METABOLIC PANEL
Anion gap: 12 (ref 5–15)
BUN: 30 mg/dL — ABNORMAL HIGH (ref 8–23)
CO2: 22 mmol/L (ref 22–32)
Calcium: 8.7 mg/dL — ABNORMAL LOW (ref 8.9–10.3)
Chloride: 100 mmol/L (ref 98–111)
Creatinine, Ser: 2.12 mg/dL — ABNORMAL HIGH (ref 0.44–1.00)
GFR, Estimated: 23 mL/min — ABNORMAL LOW (ref >60)
Glucose, Bld: 116 mg/dL — ABNORMAL HIGH (ref 70–99)
Potassium: 3.7 mmol/L (ref 3.5–5.1)
Sodium: 134 mmol/L — ABNORMAL LOW (ref 135–145)

## 2021-06-05 LAB — CBC
HCT: 33 % — ABNORMAL LOW (ref 36.0–46.0)
Hemoglobin: 10.5 g/dL — ABNORMAL LOW (ref 12.0–15.0)
MCH: 29 pg (ref 26.0–34.0)
MCHC: 31.8 g/dL (ref 30.0–36.0)
MCV: 91.2 fL (ref 80.0–100.0)
Platelets: 422 10*3/uL — ABNORMAL HIGH (ref 150–400)
RBC: 3.62 MIL/uL — ABNORMAL LOW (ref 3.87–5.11)
RDW: 15 % (ref 11.5–15.5)
WBC: 13.3 10*3/uL — ABNORMAL HIGH (ref 4.0–10.5)
nRBC: 0 % (ref 0.0–0.2)

## 2021-06-05 LAB — GLUCOSE, CAPILLARY
Glucose-Capillary: 132 mg/dL — ABNORMAL HIGH (ref 70–99)
Glucose-Capillary: 172 mg/dL — ABNORMAL HIGH (ref 70–99)
Glucose-Capillary: 121 mg/dL — ABNORMAL HIGH (ref 70–99)

## 2021-06-05 LAB — MAGNESIUM: Magnesium: 2 mg/dL (ref 1.7–2.4)

## 2021-06-05 MED ORDER — FUROSEMIDE 10 MG/ML IJ SOLN: 40.0000 mg | Freq: Once | INTRAMUSCULAR | Status: DC

## 2021-06-05 NOTE — Progress Notes (Signed)
PROGRESS NOTE    Anna Hoffman  FWY:[id number] DOB: [birth year] DOA: 05/22/2021 PCP: Glendale Chard, MD    Brief Narrative:   86 y.o. female with PMH of esophagitis, CKD stage III, HTN, colitis, hypertrophic cardiomyopathy and intracerebral hemorrhage, diabetes   presented to hospital with nausea vomiting and weakness for 2 to 3 days with difficulty ambulating and confusion for few weeks.  She also had a fall with some trauma to the right shoulder.  In the ED, patient was mildly tachycardic and hypertensive.  Creatinine was elevated at 1.82 from baseline 1.2.  There was mild leukocytosis but hemoglobin was 16.1.  X-ray of chest, CT head scan and pelvic scan were negative for acute findings except for left renal angiomyolipoma.  Patient received vancomycin cefepime and Flagyl in the ED and was admitted to hospital after IV fluid bolus. Currently, cultures have been negative and  antibiotics have been discontinued after completion of 3-day course of cefepime.  Patient continues to be weak and debilitated and PT has recommended skilled nursing facility placement.  Cardiology has seen the patient  for her history of intermittent heart block and question hypertrophic cardiomyopathy and has been adjusted on medications. On  05/26/2021, patient had accelerated hypertension and was less responsive.  Neurology was consulted.  Concern for stroke and cefepime induced neurotoxicity.  MRI showed minute stroke. Pt has since remained medically stable  Assessment & Plan:   Principal Problem:   SIRS (systemic inflammatory response syndrome) ([name]) Active Problems:   Benign essential HTN   Type 2 diabetes mellitus with stage 3 chronic kidney disease, without long-term current use of insulin ([name])   Mixed hyperlipidemia   Acute renal failure superimposed on stage 3a chronic kidney disease ([name])   Prolonged QT interval   Dehydration   HOCM (hypertrophic obstructive cardiomyopathy) ([name])   History of CVA  (cerebrovascular accident)   Sepsis ([place])   Malnutrition of moderate degree   Acute metabolic encephalopathy  1 SIRS, POA -On admission patient met criteria for SIRS with tachycardia, lactic acidosis, leukocytosis with no obvious source. -CT abdomen and pelvis done with no acute findings. -COVID-19 PCR negative, MRSA PCR negative, blood cultures negative x5 days. -Patient initially placed empirically on broad-spectrum antibiotics and received approximately 3 days worse which was subsequently discontinued due to concern for cefepime induced neurotoxicity and encephalopathy. -Leukocytosis trended down. -Status post LP with no signs of bacterial meningitis or encephalitis.  -Remained stable off antibiotics  2.  Acute metabolic encephalopathy -May be secondary to acute/early subacute infarct and sinus tachycardia with HOCM. -Neurology doubts if acute/subacute infarct etiology of patient's metabolic encephalopathy. -LP done with no signs of meningitis or encephalitis. -Currently mentating well, appears to be at or near baseline  3.  Acute/early subacute infarct -MRI brain with 2 mm subacute/acute infarct on posterior right frontal lobe and 3 mm in the callosal splenium. -Patient noted to have weakness on the left upper and lower extremities. -Patient underwent MRA angiogram head and neck with no large vessel occlusion, moderate intracranial atherosclerotic disease. -Carotid Dopplers with no significant ICA stenosis, vertebral arteries with high resistant flow. -Patient with recent 2D echo done 04/01/2021 and as such not repeated. -Hemoglobin A1c 6.2. -Fasting lipid panel with LDL of 47. -Continue Crestor, amlodipine. -Continue aspirin for secondary stroke prophylaxis.  -Neurology was following but have signed off as of 05/30/2021. -Continue with outpatient follow-up with neurology.  4.  Acute kidney injury on chronic kidney disease stage IIIa -Baseline creatinine 1.2 peaked at  2.3. -  Renal function improved with hydration.   -Creatinine at 1.91.   -Patient with concerns for possible volume overload and as such IV fluids have been saline locked. -Patient was given 2 doses of IV Lasix overnight, stable  5.  Hypokalemia -Potassium stable and within normal limits  6.  Metabolic acidosis -Was on oral bicarb tablets.   -Bicarb currently normal  7.  Sinus tachycardia -Was on IV Lopressor however transitioned to oral Lopressor per cardiology which patient is tolerating. -If patient oral intake worsens again will need to be placed back on IV Lopressor. -Per cardiology.  8.  Hypertension -Continue Norvasc, hydralazine, metoprolol.   9.  History of second-degree AV block/first-degree AV block -Patient was being followed by cardiology, continue beta-blocker.  10.HOCM -Being followed by cardiology who suspect LVOT obstruction and MR noted on examination. -Per cardiology avoid dehydration. -Continue beta-blocker. -IV fluids have been saline locked. -Patient to receive Lasix 20 mg IV x1 -Appreciate cardiology input and recommendations. -Recommend outpatient follow-up with cardiology.  11.  Well-controlled diabetes mellitus type 2 -Hemoglobin A1c 6.2 (05/28/2021). -CBG 109. -Cont SSI as needed  12.  Hyperlipidemia -Continue statin.  44.  Recent fall/deconditioning/debility -Being followed by PT/OT who are recommending SNF placement. -TOC consulted for placement.  14.  Moderate protein calorie malnutrition -Continue nutritional supplementation.   -Patient was on a dysphagia 1 diet and advance to a heart healthy diet which she seems to be tolerating.   -Patient more alert, SLP following.   15.  Right shoulder pain -Secondary to recent fall and history of retrocochlear. -Plain films of the right shoulder with no acute fracture. -Continue K pad, local analgesics. -Recommend outpatient follow-up  16.  Fever/probable UTI -Patient noted to have low-grade  temps of 100.4 early on this morning. -Chest x-ray done with cardiomegaly, marked prominence of central pulmonary vessels with increased interstitial markings both lungs concerning for CHF with pulmonary edema. -Urinalysis with large leukocytes, nitrite negative, rare bacteria, > 50 WBC. -Place empirically on IV Rocephin pending urine culture results.   17.  Volume overload -Patient noted to have low-grade temp, chest x-ray done concerning for pulmonary edema. -We given Lasix 20 mg IV daily x 2 doses. -Patient with HOCM and per cardiology to avoid dehydration. -Stable this AM    DVT prophylaxis: Lovenox subq Code Status: Full Family Communication: Pt in room, pt's son at bedside  Status is: Inpatient  Remains inpatient appropriate because: severity of illness    Consultants:  Neurology: Dr.Khaliqdina 05/27/2021 Cardiology: Dr. Johnsie Cancel 05/24/2021 Interventional radiology  Procedures:  Lumbar puncture under fluoroscopy, per IR, Dr. Ilda Foil 05/29/2021 CT abdomen and pelvis 05/22/2021 CT head without contrast 05/26/2021, 05/22/2021 Chest x-ray 05/22/2021, 06/04/2021 MRI C-spine 05/27/2021 MRI angiogram head 05/27/2021 MRI brain 05/27/2021 Renal ultrasound 05/28/2021 Carotid ultrasound 05/28/2021 EEG 05/27/2021  Antimicrobials: Anti-infectives (From admission, onward)    Start     Dose/Rate Route Frequency Ordered Stop   06/04/21 1300  cefTRIAXone (ROCEPHIN) 1 g in sodium chloride 0.9 % 100 mL IVPB        1 g 200 mL/hr over 30 Minutes Intravenous Every 24 hours 06/04/21 1139     05/23/21 2200  ceFEPIme (MAXIPIME) 2 g in sodium chloride 0.9 % 100 mL IVPB  Status:  Discontinued        2 g 200 mL/hr over 30 Minutes Intravenous Every 24 hours 05/23/21 0008 05/26/21 1119   05/23/21 0900  metroNIDAZOLE (FLAGYL) IVPB 500 mg  Status:  Discontinued  500 mg 100 mL/hr over 60 Minutes Intravenous Every 12 hours 05/22/21 2349 05/24/21 1151   05/22/21 2130  ceFEPIme (MAXIPIME) 2 g  in sodium chloride 0.9 % 100 mL IVPB        2 g 200 mL/hr over 30 Minutes Intravenous  Once 05/22/21 2126 05/23/21 0038   05/22/21 2130  metroNIDAZOLE (FLAGYL) IVPB 500 mg        500 mg 100 mL/hr over 60 Minutes Intravenous  Once 05/22/21 2126 05/23/21 0039   05/22/21 2130  vancomycin (VANCOCIN) IVPB 1000 mg/200 mL premix  Status:  Discontinued        1,000 mg 200 mL/hr over 60 Minutes Intravenous  Once 05/22/21 2126 05/22/21 2129   05/22/21 2130  vancomycin (VANCOREADY) IVPB 1250 mg/250 mL        1,250 mg 166.7 mL/hr over 90 Minutes Intravenous  Once 05/22/21 2129 05/23/21 0039       Subjective: Without complaints this AM. Denies sob or chest pains  Objective: Vitals:   06/04/21 1407 06/05/21 0535 06/05/21 1121 06/05/21 1500  BP: (!) 151/73 (!) 155/75 138/76 133/82  Pulse: 96 (!) 110 (!) 101   Resp: 14 18    Temp: 98 F (36.7 C) 98.8 F (37.1 C)    TempSrc: Oral Oral    SpO2: 96% 98%    Weight:      Height:        Intake/Output Summary (Last 24 hours) at 06/05/2021 1554 Last data filed at 06/05/2021 1126 Gross per 24 hour  Intake 603 ml  Output 1350 ml  Net -747 ml   Filed Weights   05/23/21 0237  Weight: 61.4 kg    Examination: General exam: Awake, laying in bed, in nad Respiratory system: Normal respiratory effort, no wheezing Cardiovascular system: regular rate, s1, s2 Gastrointestinal system: Soft, nondistended, positive BS Central nervous system: CN2-12 grossly intact, strength intact Extremities: Perfused, no clubbing Skin: Normal skin turgor, no notable skin lesions seen Psychiatry: Mood normal // no visual hallucinations   Data Reviewed: I have personally reviewed following labs and imaging studies  CBC: Recent Labs  Lab 05/30/21 0443 05/31/21 0418 06/01/21 0445 06/02/21 0501 06/05/21 0441  WBC 14.1* 11.5* 11.5* 11.5* 13.3*  NEUTROABS 10.7* 8.4*  --   --   --   HGB 10.8* 10.5* 11.0* 10.4* 10.5*  HCT 35.1* 33.3* 34.7* 32.6* 33.0*  MCV 93.9  91.5 92.0 89.6 91.2  PLT 293 283 300 349 248*   Basic Metabolic Panel: Recent Labs  Lab 05/31/21 0418 06/01/21 0445 06/02/21 0501 06/03/21 0451 06/04/21 0429 06/05/21 0441  NA 136 133* 135 134* 133* 134*  K 3.6 3.7 3.8 4.0 3.9 3.7  CL 107 105 103 101 101 100  CO2 22 19* _0 GLUCOSE 105* 94 100* 126* 130* 116*  BUN 36* 31* 31* 36* 34* 30*  CREATININE 1.78* 1.66* 1.57* 1.83* 1.91* 2.12*  CALCIUM 8.7* 8.7* 8.5* 8.6* 8.4* 8.7*  MG 2.1 2.0  --  2.2 2.0 2.0   GFR: Estimated Creatinine Clearance: 17.8 mL/min (A) (by C-G formula based on SCr of 2.12 mg/dL (H)). Liver Function Tests: No results for input(s): AST, ALT, ALKPHOS, BILITOT, PROT, ALBUMIN in the last 168 hours. No results for input(s): LIPASE, AMYLASE in the last 168 hours. No results for input(s): AMMONIA in the last 168 hours. Coagulation Profile: No results for input(s): INR, PROTIME in the last 168 hours. Cardiac Enzymes: No results for input(s): CKTOTAL, CKMB, CKMBINDEX, TROPONINI in  the last 168 hours. BNP (last 3 results) No results for input(s): PROBNP in the last 8760 hours. HbA1C: No results for input(s): HGBA1C in the last 72 hours. CBG: Recent Labs  Lab 06/04/21 0751 06/04/21 1130 06/04/21 1609 06/04/21 2205 06/05/21 1120  GLUCAP 109* 136* 132* 172* 121*   Lipid Profile: No results for input(s): CHOL, HDL, LDLCALC, TRIG, CHOLHDL, LDLDIRECT in the last 72 hours. Thyroid Function Tests: No results for input(s): TSH, T4TOTAL, FREET4, T3FREE, THYROIDAB in the last 72 hours. Anemia Panel: No results for input(s): VITAMINB12, FOLATE, FERRITIN, TIBC, IRON, RETICCTPCT in the last 72 hours. Sepsis Labs: Recent Labs  Lab 05/30/21 0443 05/31/21 0418  PROCALCITON 0.27 0.44    Recent Results (from the past 240 hour(s))  CSF culture w Gram Stain     Status: None   Collection Time: 05/29/21  3:22 PM   Specimen: PATH Cytology CSF; Cerebrospinal Fluid  Result Value Ref Range Status   Specimen  Description   Final    CSF Performed at [facility], [place] [address], [place], [place] [address]    Special Requests   Final    NONE Performed at [facility], [place] [address], [place], [place] [address]    Gram Stain   Final    NO WBC SEEN NO ORGANISMS SEEN CYTOSPIN SMEAR Gram Stain Report Called to,Read Back By and Verified With: C.ACHUSIM, RN AT 5176 ON 10.26.22 BY N.THOMPSON Performed at [place] [address] [place], [place] [address]    Culture   Final    NO GROWTH 3 DAYS Performed at [facility], [place] [address] [place], [place] [address]    Report Status 06/02/2021 FINAL  Final  Resp Panel by RT-PCR (Flu A&B, Covid) Nasopharyngeal Swab     Status: None   Collection Time: 06/03/21 12:53 PM   Specimen: Nasopharyngeal Swab; Nasopharyngeal(NP) swabs in vial transport medium  Result Value Ref Range Status   SARS Coronavirus 2 by RT PCR NEGATIVE NEGATIVE Final    Comment: (NOTE) SARS-CoV-2 target nucleic acids are NOT DETECTED.  The SARS-CoV-2 RNA is generally detectable in upper respiratory specimens during the acute phase of infection. The lowest concentration of SARS-CoV-2 viral copies this assay can detect is 138 copies/mL. A negative result does not preclude SARS-Cov-2 infection and should not be used as the sole basis for treatment or other patient management decisions. A negative result may occur with  improper specimen collection/handling, submission of specimen other than nasopharyngeal swab, presence of viral mutation(s) within the areas targeted by this assay, and inadequate number of viral copies(<138 copies/mL). A negative result must be combined with clinical observations, patient history, and epidemiological information. The expected result is Negative.  Fact Sheet for Patients:  EntrepreneurPulse.com.au  Fact Sheet for Healthcare Providers:   IncredibleEmployment.be  This test is no t yet approved or cleared by the [place] FDA and  has been authorized for detection and/or diagnosis of SARS-CoV-2 by FDA under an Emergency Use Authorization (EUA). This EUA will remain  in effect (meaning this test can be used) for the duration of the COVID-19 declaration under Section 564(b)(1) of the Act, 21 U.S.C.section 360bbb-3(b)(1), unless the authorization is terminated  or revoked sooner.       Influenza A by PCR NEGATIVE NEGATIVE Final   Influenza B by PCR NEGATIVE NEGATIVE Final    Comment: (NOTE) The Xpert Xpress SARS-CoV-2/FLU/RSV plus assay is intended as an aid in the diagnosis of  influenza from Nasopharyngeal swab specimens and should not be used as a sole basis for treatment. Nasal washings and aspirates are unacceptable for Xpert Xpress SARS-CoV-2/FLU/RSV testing.  Fact Sheet for Patients: EntrepreneurPulse.com.au  Fact Sheet for Healthcare Providers: IncredibleEmployment.be  This test is not yet approved or cleared by the [place] FDA and has been authorized for detection and/or diagnosis of SARS-CoV-2 by FDA under an Emergency Use Authorization (EUA). This EUA will remain in effect (meaning this test can be used) for the duration of the COVID-19 declaration under Section 564(b)(1) of the Act, 21 U.S.C. section 360bbb-3(b)(1), unless the authorization is terminated or revoked.  Performed at [facility], [place] [address], [place], [place] [address]   Urine Culture     Status: Abnormal (Preliminary result)   Collection Time: 06/04/21  8:03 AM   Specimen: Urine, Catheterized  Result Value Ref Range Status   Specimen Description   Final    URINE, CATHETERIZED Performed at [facility], [place] [address], [place], [place] [address]    Special Requests   Final    NONE Performed at [facility],  [place] [address], [place], [place] [address]    Culture (A)  Final    >=100,000 COLONIES/mL ESCHERICHIA COLI SUSCEPTIBILITIES TO FOLLOW Performed at [facility], [place] [address] [place], [place] [address]    Report Status PENDING  Incomplete     Radiology Studies: DG Chest 2 View  Result Date: 06/04/2021 CLINICAL DATA:  Fever EXAM: CHEST - 2 VIEW COMPARISON:  Previous studies including the examination of 05/22/2021 FINDINGS: Transverse diameter of heart is increased thoracic aorta is tortuous and ectatic. There is marked prominence of central pulmonary vessels. Increased interstitial markings are seen in the parahilar regions and lower lung fields. There is poor inspiration. There is blunting of both lateral CP angles. There is no pneumothorax. IMPRESSION: Cardiomegaly. There is marked prominence of central pulmonary vessels along with increased interstitial markings in both lungs suggesting CHF with pulmonary edema. Possibility of underlying pneumonia is not excluded. Small bilateral pleural effusions are seen. Electronically Signed   By: Elmer Picker M.D.   On: 06/04/2021 11:09    Scheduled Meds:  amLODipine  10 mg Oral Daily   aspirin  81 mg Oral Daily   Chlorhexidine Gluconate Cloth  6 each Topical Daily   diclofenac Sodium  2 g Topical QID   enoxaparin (LOVENOX) injection  30 mg Subcutaneous Q24H   feeding supplement (K[employer] STANDARD 1.4)  325 mL Oral BID BM   furosemide  20 mg Intravenous Daily   hydrALAZINE  100 mg Oral Q8H   insulin aspart  0-9 Units Subcutaneous TID WC   metoprolol tartrate  12.5 mg Oral BID   multivitamin with minerals  1 tablet Oral Daily   pantoprazole  40 mg Oral BID   rosuvastatin  20 mg Oral Daily   sodium bicarbonate  650 mg Oral BID   sodium chloride flush  3 mL Intravenous Q12H   thiamine  100 mg Oral Daily   Continuous Infusions:  cefTRIAXone (ROCEPHIN)  IV 1 g (06/05/21 1326)     LOS: 13 days   Marylu Lund, MD [facility] Pager On Amion  If 7PM-7AM, please contact night-coverage 06/05/2021, 3:54 PM

## 2021-06-05 NOTE — Plan of Care (Signed)
  Problem: Nutrition: Goal: Adequate nutrition will be maintained Outcome: Progressing   Problem: Coping: Goal: Level of anxiety will decrease Outcome: Progressing   Problem: Elimination: Goal: Will not experience complications related to bowel motility Outcome: Progressing Goal: Will not experience complications related to urinary retention Outcome: Progressing   

## 2021-06-05 NOTE — Progress Notes (Addendum)
Speech Language Pathology Treatment: Dysphagia  Patient Details Name: Anna Hoffman MRN: [id number] DOB: [birth year] Today's Date: 06/05/2021 Time: 8841-6606 SLP Time Calculation (min) (ACUTE ONLY): 22 min  Assessment / Plan / Recommendation Clinical Impression  Pt follow up regarding swallow precautions and MBS with dysphagia goals.  Pt denies issues swallowing again - demonstrating some decreased awareness.  SLP reviewed pt's MBS with her using teach back for precautions.  Pharyngeal swallow is strong without retention- but delayed in swallow with liquids accumulating to pyriform prior to swallow trigger. Advised pt to assure oral coordination with timely swallow with liquids.  RN reported pt with subtle cough a few times with po liquids.    Pt is afebrile, lungs are decreased and her intake has been good per review of chart.  Note pt's UTI during this hospital admit and importance of hydration.  Given pt with baseline cough during MBS, SLP cannot assure cough is aspiration related - pt has hiatal hernia that may contribute as well.   Advised pt to sit upright, take small bites/sips and consume water with meals.   Using teach back, pt repeated precautions - however given her decreased awareness, she will benefit from intermittent supervision to assure tolerance and implementation of helpful precautions.    Will follow up once more given pt's potential pna on her recent CXR to assure tolerance.  SLP took pt's phone to front desk and plugged in to charge per pt request.  Placed it in a clear bag and placed pt's name sticker on the phone case and on the bag. Pt expressed gratitude, Therapist, sports and Retail banker aware.  Thanks.  HPI HPI: Pt is an 86 yo female adm to [facility] with N/V. Pt has h/o esophagitis, HH, CKD Stage 3A, cardiomyopathy, DM, colitis, HTN, right lobe pna, ICH. CT head Small chronic infarcts in the right  thalamus and left cerebellar hemisphere are unchanged. PT with several admits to hospital  in last several months.  Most recent for fall.      SLP Plan  Continue with current plan of care      Recommendations for follow up therapy are one component of a multi-disciplinary discharge planning process, led by the attending physician.  Recommendations may be updated based on patient status, additional functional criteria and insurance authorization.    Recommendations  Diet recommendations: Dysphagia 3 (mechanical soft);Thin liquid Liquids provided via: Straw Medication Administration: Whole meds with puree Supervision: Intermittent supervision to cue for compensatory strategies Compensations: Slow rate;Small sips/bites;Minimize environmental distractions;Follow solids with liquid Postural Changes and/or Swallow Maneuvers: Seated upright 90 degrees;Upright 30-60 min after meal                Oral Care Recommendations: Oral care BID;Staff/trained caregiver to provide oral care Follow up Recommendations: None SLP Visit Diagnosis: Dysphagia, oropharyngeal phase (R13.12) Plan: Continue with current plan of care       GO               Kathleen Lime, MS [place] Office [phone] Pager [phone]  Macario Golds  06/05/2021, 12:21 PM

## 2021-06-05 NOTE — Progress Notes (Signed)
Nutrition Follow-up  DOCUMENTATION CODES:   Non-severe (moderate) malnutrition in context of chronic illness  INTERVENTION:  - continue [employer] 1.4 BID.    NUTRITION DIAGNOSIS:   Moderate Malnutrition related to chronic illness as evidenced by moderate fat depletion, moderate muscle depletion, severe muscle depletion. -ongoing  GOAL:   Patient will meet greater than or equal to 90% of their needs -met on average  MONITOR:   PO intake, Supplement acceptance, Labs, Weight trends  ASSESSMENT:   86 y.o. female with medical history of esophagitis, stage 3 CKD, HTN, colitis, hypertrophic cardiomyopathy and intracerebral hemorrhage, and DM. She presented to the ED due to N/V and weakness for 2-3 days and confusion and difficulty ambulating for several weeks. CXR, CT head, and CT pelvis were negative except for L renal angiomyolipoma.  Patient is noted to be a/o to self only. She has been eating 50-100% (average of 50%) at meals over the past 5 days. She has been accepting [employer] 100% of the time offered over the past 1 week.   She has not been weighed since 10/20. No edema yesterday or today. She is noted to be +3.1 L since admission.   Per MD note yesterday, patient to remain inpatient due to ongoing cardiac management, encephalopathy, and unsafe d/c plan.     Labs reviewed; Na: 134 mmol/l, BUN: 30 mg/dl, creatinine: 2.12 mg/dl, Ca: 8.7 mg/dl, GFR: 23 ml/min.   Medications reviewed; 20 mg IV lasix/day, sliding scale novolog, 1 tablet multivitamin with minerals/day, 40 mg oral protonix BID, 650 mg sodium bicarb BID, 100 mg oral thiamine/day.    Diet Order:   Diet Order             DIET DYS 3 Room service appropriate? Yes; Fluid consistency: Thin  Diet effective now                   EDUCATION NEEDS:   Education needs have been addressed  Skin:  Skin Assessment: Reviewed RN Assessment  Last BM:  10/26  Height:   Ht Readings from Last 1 Encounters:   05/23/21 '5\' 5"'  (1.651 m)    Weight:   Wt Readings from Last 1 Encounters:  05/23/21 61.4 kg     Estimated Nutritional Needs:  Kcal:  1540-1720 kcal Protein:  75-90 grams Fluid:  >/= 1.7 L/day      Jarome Matin, MS, RD, LDN, CNSC Inpatient Clinical Dietitian RD pager # available in AMION  After hours/weekend pager # available in [facility]

## 2021-06-05 NOTE — Progress Notes (Signed)
Physical Therapy Treatment Patient Details Name: Anna Hoffman MRN: [id number] DOB: 03/25/1935 Today's Date: 06/05/2021   History of Present Illness 86 y.o. female presenting to ED with N/V and weakness x 2-3 days with mutliple falls resulting in trauma to R shoulder (followed by outpatient PT). Patient found to have SIRS. PMH significant for esophagitis, CKD stage IIIa, hypertension, colitis, hypertrophic cardiomyopathy and intracerebral hemorrhage, diabetes.    PT Comments    Pt OOB in recliner via nursing + 2 assist reported.  Assisted out of recliner to [facility] then attempted gait. General transfer comment: MaAssist + 2 transfer from recliner to 1/4 pivot to [facility] was difficult.  Decreased weight shift to L LE and increased L LE drag.  Unable to safely complete turn.  Required Total Assist to target center of seat.  Required assist to control desend.  Pt tens to sit quickly due to fatigue.  Poor forward flex posture and MAX c/o fatigue/weakness.  General Gait Details: Required + 2 Max Assist to advance goit to only 3 feet feet with difficulty advanceing L LE and poor forward flex posture with tendency to push walker too far to front.  Recliner following closely behind.  Limited distance due to weakness. Positioned in recliner to comfort. Pt will need ST Rehab at SNF prior to returning home.   Recommendations for follow up therapy are one component of a multi-disciplinary discharge planning process, led by the attending physician.  Recommendations may be updated based on patient status, additional functional criteria and insurance authorization.  Follow Up Recommendations  Skilled nursing-short term rehab (<3 hours/day)     Assistance Recommended at Discharge    Equipment Recommendations  Wheelchair cushion (measurements PT);Wheelchair (measurements PT);Hospital bed    Recommendations for Other Services       Precautions / Restrictions Precautions Precautions: Fall Precaution Comments: Hx  of falls; high fall risk, CVA L Hemi     Mobility  Bed Mobility               General bed mobility comments: OOB in recliner    Transfers Overall transfer level: Needs assistance Equipment used: Rolling walker (2 wheels);None Transfers: Sit to/from [facility] Sit to Stand: Max assist;+2 physical assistance;+2 safety/equipment Stand pivot transfers: Max assist;Total assist;+2 physical assistance;+2 safety/equipment         General transfer comment: MaAssist + 2 transfer from recliner to 1/4 pivot to [facility] was difficult.  Decreased weight shift to L LE and increased L LE drag.  Unable to safely complete turn.  Required Total Assist to target center of seat.  Required assist to control desend.  Pt tens to sit quickly due to fatigue.  Poor forward flex posture and MAX c/o fatigue/weakness.    Ambulation/Gait Ambulation/Gait assistance: Max assist;+2 physical assistance;+2 safety/equipment Gait Distance (Feet): 3 Feet Assistive device: Rolling walker (2 wheels) Gait Pattern/deviations: Step-through pattern;Decreased stride length;Shuffle;Narrow base of support;Decreased stance time - left;Decreased step length - right Gait velocity: decr   General Gait Details: Required + 2 Max Assist to advance goit to only 3 feet feet with difficulty advanceing L LE and poor forward flex posture with tendency to push walker too far to front.  Recliner following closely behind.  Limited distance due to weakness.   Stairs             Wheelchair Mobility    Modified Rankin (Stroke Patients Only)       Balance  Cognition Arousal/Alertness: Awake/alert Behavior During Therapy: WFL for tasks assessed/performed Overall Cognitive Status: No family/caregiver present to determine baseline cognitive functioning                                 General Comments: AxO x 1.5 pleasant following all  simple functional commands.  Can not recall what she ate for breakdast.  Able to express needs.        Exercises      General Comments        Pertinent Vitals/Pain Pain Assessment: Faces Faces Pain Scale: Hurts a little bit Pain Location: Rt shoulder Pain Descriptors / Indicators: Discomfort Pain Intervention(s): Monitored during session    Home Living                          Prior Function            PT Goals (current goals can now be found in the care plan section) Progress towards PT goals: Progressing toward goals    Frequency    Min 2X/week      PT Plan Current plan remains appropriate    Co-evaluation              AM-PAC PT "6 Clicks" Mobility   Outcome Measure  Help needed turning from your back to your side while in a flat bed without using bedrails?: Total Help needed moving from lying on your back to sitting on the side of a flat bed without using bedrails?: Total Help needed moving to and from a bed to a chair (including a wheelchair)?: Total Help needed standing up from a chair using your arms (e.g., wheelchair or bedside chair)?: Total Help needed to walk in hospital room?: Total Help needed climbing 3-5 steps with a railing? : Total 6 Click Score: 6    End of Session   Activity Tolerance: Patient limited by fatigue Patient left: with call bell/phone within reach;with chair alarm set;with family/visitor present;in chair Nurse Communication: Mobility status PT Visit Diagnosis: Muscle weakness (generalized) (M62.81);Other symptoms and signs involving the nervous system (R29.898)     Time: 1026-1050 PT Time Calculation (min) (ACUTE ONLY): 24 min  Charges:  $Gait Training: 8-22 mins $Therapeutic Activity: 8-22 mins                     Rica Koyanagi  PTA Acute  Rehabilitation Services Pager      [phone] Office      [phone]

## 2021-06-06 ENCOUNTER — Other Ambulatory Visit (HOSPITAL_COMMUNITY): Payer: Self-pay

## 2021-06-06 DIAGNOSIS — E440 Moderate protein-calorie malnutrition: Secondary | ICD-10-CM | POA: Diagnosis not present

## 2021-06-06 DIAGNOSIS — K5730 Diverticulosis of large intestine without perforation or abscess without bleeding: Secondary | ICD-10-CM | POA: Diagnosis not present

## 2021-06-06 DIAGNOSIS — R404 Transient alteration of awareness: Secondary | ICD-10-CM | POA: Diagnosis not present

## 2021-06-06 DIAGNOSIS — W19XXXD Unspecified fall, subsequent encounter: Secondary | ICD-10-CM | POA: Diagnosis not present

## 2021-06-06 DIAGNOSIS — I700 Atherosclerosis of aorta: Secondary | ICD-10-CM | POA: Diagnosis not present

## 2021-06-06 DIAGNOSIS — I421 Obstructive hypertrophic cardiomyopathy: Secondary | ICD-10-CM | POA: Diagnosis not present

## 2021-06-06 DIAGNOSIS — R6510 Systemic inflammatory response syndrome (SIRS) of non-infectious origin without acute organ dysfunction: Secondary | ICD-10-CM | POA: Diagnosis not present

## 2021-06-06 DIAGNOSIS — N390 Urinary tract infection, site not specified: Secondary | ICD-10-CM | POA: Diagnosis not present

## 2021-06-06 DIAGNOSIS — I441 Atrioventricular block, second degree: Secondary | ICD-10-CM | POA: Diagnosis not present

## 2021-06-06 DIAGNOSIS — M25511 Pain in right shoulder: Secondary | ICD-10-CM | POA: Diagnosis not present

## 2021-06-06 DIAGNOSIS — Z794 Long term (current) use of insulin: Secondary | ICD-10-CM | POA: Diagnosis not present

## 2021-06-06 DIAGNOSIS — J301 Allergic rhinitis due to pollen: Secondary | ICD-10-CM | POA: Diagnosis not present

## 2021-06-06 DIAGNOSIS — M47892 Other spondylosis, cervical region: Secondary | ICD-10-CM | POA: Diagnosis not present

## 2021-06-06 DIAGNOSIS — I639 Cerebral infarction, unspecified: Secondary | ICD-10-CM | POA: Diagnosis not present

## 2021-06-06 DIAGNOSIS — I4581 Long QT syndrome: Secondary | ICD-10-CM | POA: Diagnosis not present

## 2021-06-06 DIAGNOSIS — G9341 Metabolic encephalopathy: Secondary | ICD-10-CM | POA: Diagnosis not present

## 2021-06-06 DIAGNOSIS — K449 Diaphragmatic hernia without obstruction or gangrene: Secondary | ICD-10-CM | POA: Diagnosis not present

## 2021-06-06 DIAGNOSIS — M4802 Spinal stenosis, cervical region: Secondary | ICD-10-CM | POA: Diagnosis not present

## 2021-06-06 DIAGNOSIS — I10 Essential (primary) hypertension: Secondary | ICD-10-CM | POA: Diagnosis not present

## 2021-06-06 DIAGNOSIS — E1122 Type 2 diabetes mellitus with diabetic chronic kidney disease: Secondary | ICD-10-CM | POA: Diagnosis not present

## 2021-06-06 DIAGNOSIS — Z743 Need for continuous supervision: Secondary | ICD-10-CM | POA: Diagnosis not present

## 2021-06-06 DIAGNOSIS — N1831 Chronic kidney disease, stage 3a: Secondary | ICD-10-CM | POA: Diagnosis not present

## 2021-06-06 DIAGNOSIS — R410 Disorientation, unspecified: Secondary | ICD-10-CM | POA: Diagnosis not present

## 2021-06-06 DIAGNOSIS — N1832 Chronic kidney disease, stage 3b: Secondary | ICD-10-CM | POA: Diagnosis not present

## 2021-06-06 DIAGNOSIS — D1771 Benign lipomatous neoplasm of kidney: Secondary | ICD-10-CM | POA: Diagnosis not present

## 2021-06-06 DIAGNOSIS — R2689 Other abnormalities of gait and mobility: Secondary | ICD-10-CM | POA: Diagnosis not present

## 2021-06-06 DIAGNOSIS — E785 Hyperlipidemia, unspecified: Secondary | ICD-10-CM | POA: Diagnosis not present

## 2021-06-06 DIAGNOSIS — E7849 Other hyperlipidemia: Secondary | ICD-10-CM | POA: Diagnosis not present

## 2021-06-06 DIAGNOSIS — M6281 Muscle weakness (generalized): Secondary | ICD-10-CM | POA: Diagnosis not present

## 2021-06-06 DIAGNOSIS — M19012 Primary osteoarthritis, left shoulder: Secondary | ICD-10-CM | POA: Diagnosis not present

## 2021-06-06 DIAGNOSIS — R5381 Other malaise: Secondary | ICD-10-CM | POA: Diagnosis not present

## 2021-06-06 DIAGNOSIS — N179 Acute kidney failure, unspecified: Secondary | ICD-10-CM | POA: Diagnosis not present

## 2021-06-06 DIAGNOSIS — E8779 Other fluid overload: Secondary | ICD-10-CM | POA: Diagnosis not present

## 2021-06-06 DIAGNOSIS — Z1159 Encounter for screening for other viral diseases: Secondary | ICD-10-CM | POA: Diagnosis not present

## 2021-06-06 DIAGNOSIS — M21921 Unspecified acquired deformity of right upper arm: Secondary | ICD-10-CM | POA: Diagnosis not present

## 2021-06-06 DIAGNOSIS — Z1612 Extended spectrum beta lactamase (ESBL) resistance: Secondary | ICD-10-CM | POA: Diagnosis not present

## 2021-06-06 DIAGNOSIS — R262 Difficulty in walking, not elsewhere classified: Secondary | ICD-10-CM | POA: Diagnosis not present

## 2021-06-06 DIAGNOSIS — Z7401 Bed confinement status: Secondary | ICD-10-CM | POA: Diagnosis not present

## 2021-06-06 LAB — COMPREHENSIVE METABOLIC PANEL
ALT: 36 U/L (ref 0–44)
AST: 28 U/L (ref 15–41)
Albumin: 2.6 g/dL — ABNORMAL LOW (ref 3.5–5.0)
Alkaline Phosphatase: 61 U/L (ref 38–126)
Anion gap: 7 (ref 5–15)
BUN: 30 mg/dL — ABNORMAL HIGH (ref 8–23)
CO2: 24 mmol/L (ref 22–32)
Calcium: 8.2 mg/dL — ABNORMAL LOW (ref 8.9–10.3)
Chloride: 102 mmol/L (ref 98–111)
Creatinine, Ser: 1.61 mg/dL — ABNORMAL HIGH (ref 0.44–1.00)
GFR, Estimated: 31 mL/min — ABNORMAL LOW (ref >60)
Glucose, Bld: 120 mg/dL — ABNORMAL HIGH (ref 70–99)
Potassium: 3.8 mmol/L (ref 3.5–5.1)
Sodium: 133 mmol/L — ABNORMAL LOW (ref 135–145)
Total Bilirubin: 0.4 mg/dL (ref 0.3–1.2)
Total Protein: 5.8 g/dL — ABNORMAL LOW (ref 6.5–8.1)

## 2021-06-06 LAB — URINE CULTURE: Culture: >=100000 — AB

## 2021-06-06 LAB — RESP PANEL BY RT-PCR (FLU A&B, COVID) ARPGX2
Influenza A by PCR: NEGATIVE
Influenza B by PCR: NEGATIVE
SARS Coronavirus 2 by RT PCR: NEGATIVE

## 2021-06-06 LAB — CBC
HCT: 31.3 % — ABNORMAL LOW (ref 36.0–46.0)
Hemoglobin: 10 g/dL — ABNORMAL LOW (ref 12.0–15.0)
MCH: 29.4 pg (ref 26.0–34.0)
MCHC: 31.9 g/dL (ref 30.0–36.0)
MCV: 92.1 fL (ref 80.0–100.0)
Platelets: 418 10*3/uL — ABNORMAL HIGH (ref 150–400)
RBC: 3.4 MIL/uL — ABNORMAL LOW (ref 3.87–5.11)
RDW: 15.2 % (ref 11.5–15.5)
WBC: 10.7 10*3/uL — ABNORMAL HIGH (ref 4.0–10.5)
nRBC: 0 % (ref 0.0–0.2)

## 2021-06-06 LAB — GLUCOSE, CAPILLARY
Glucose-Capillary: 123 mg/dL — ABNORMAL HIGH (ref 70–99)
Glucose-Capillary: 165 mg/dL — ABNORMAL HIGH (ref 70–99)
Glucose-Capillary: 136 mg/dL — ABNORMAL HIGH (ref 70–99)
Glucose-Capillary: 186 mg/dL — ABNORMAL HIGH (ref 70–99)

## 2021-06-06 MED ORDER — METOPROLOL TARTRATE 25 MG PO TABS
12.5000 mg | ORAL_TABLET | Freq: Two times a day (BID) | ORAL | 0 refills | Status: DC
  Filled 2021-06-06: qty 30, 30d supply, fill #0

## 2021-06-06 MED ORDER — AMLODIPINE BESYLATE 10 MG PO TABS
10.0000 mg | ORAL_TABLET | Freq: Every day | ORAL | 0 refills | Status: DC
  Filled 2021-06-06: qty 30, 30d supply, fill #0

## 2021-06-06 MED ORDER — ONDANSETRON HCL 4 MG/2ML IJ SOLN
4.0000 mg | Freq: Four times a day (QID) | INTRAMUSCULAR | 0 refills | Status: DC | PRN
  Filled 2021-06-06: qty 2, 1d supply, fill #0

## 2021-06-06 MED ORDER — FOSFOMYCIN TROMETHAMINE 3 G PO PACK
3.0000 g | PACK | Freq: Once | ORAL | Status: AC
  Administered 2021-06-06: 3 g via ORAL
  Filled 2021-06-06: qty 3

## 2021-06-06 MED ORDER — INSULIN ASPART 100 UNIT/ML IJ SOLN
0.0000 [IU] | Freq: Three times a day (TID) | INTRAMUSCULAR | 11 refills | Status: DC
  Filled 2021-06-06: qty 10, 37d supply, fill #0

## 2021-06-06 MED ORDER — HYDRALAZINE HCL 100 MG PO TABS
100.0000 mg | ORAL_TABLET | Freq: Three times a day (TID) | ORAL | 0 refills | Status: DC
  Filled 2021-06-06: qty 90, 30d supply, fill #0

## 2021-06-06 NOTE — Plan of Care (Signed)

## 2021-06-06 NOTE — Discharge Summary (Signed)
Physician Discharge Summary  Anna Hoffman [facility] PIR:[id number] DOB: Nov 03, 1934 DOA: 05/22/2021  PCP: Glendale Chard, MD  Admit date: 05/22/2021 Discharge date: 06/06/2021  Admitted From: Home Disposition:  SNF  Recommendations for Outpatient Follow-up:  Follow up with PCP in 1-2 weeks  Discharge Condition:Improved CODE STATUS:Full Diet recommendation: Diabetic   Brief/Interim Summary: 86 y.o. female with PMH of esophagitis, CKD stage III, HTN, colitis, hypertrophic cardiomyopathy and intracerebral hemorrhage, diabetes   presented to hospital with nausea vomiting and weakness for 2 to 3 days with difficulty ambulating and confusion for few weeks.  She also had a fall with some trauma to the right shoulder.  In the ED, patient was mildly tachycardic and hypertensive.  Creatinine was elevated at 1.82 from baseline 1.2.  There was mild leukocytosis but hemoglobin was 16.1.  X-ray of chest, CT head scan and pelvic scan were negative for acute findings except for left renal angiomyolipoma.  Patient received vancomycin cefepime and Flagyl in the ED and was admitted to hospital after IV fluid bolus. Currently, cultures have been negative and  antibiotics have been discontinued after completion of 3-day course of cefepime.  Patient continues to be weak and debilitated and PT has recommended skilled nursing facility placement.  Cardiology has seen the patient  for her history of intermittent heart block and question hypertrophic cardiomyopathy and has been adjusted on medications. On  05/26/2021, patient had accelerated hypertension and was less responsive.  Neurology was consulted.  Concern for stroke and cefepime induced neurotoxicity.  MRI showed minute stroke. Pt has since remained medically stable  Discharge Diagnoses:  Principal Problem:   SIRS (systemic inflammatory response syndrome) ([name]) Active Problems:   Benign essential HTN   Type 2 diabetes mellitus with stage 3 chronic kidney disease, without  long-term current use of insulin ([name])   Mixed hyperlipidemia   Acute renal failure superimposed on stage 3a chronic kidney disease ([name])   Prolonged QT interval   Dehydration   HOCM (hypertrophic obstructive cardiomyopathy) ([name])   History of CVA (cerebrovascular accident)   Sepsis ([place])   Malnutrition of moderate degree   Acute metabolic encephalopathy  1 SIRS, POA -On admission patient met criteria for SIRS with tachycardia, lactic acidosis, leukocytosis with no obvious source. -CT abdomen and pelvis done with no acute findings. -COVID-19 PCR negative, MRSA PCR negative, blood cultures negative x5 days. -Patient initially placed empirically on broad-spectrum antibiotics and received approximately 3 days worse which was subsequently discontinued due to concern for cefepime induced neurotoxicity and encephalopathy. -Leukocytosis trended down. -Status post LP with no signs of bacterial meningitis or encephalitis.   2.  Acute metabolic encephalopathy -May be secondary to acute/early subacute infarct and sinus tachycardia with HOCM. -Neurology doubts if acute/subacute infarct etiology of patient's metabolic encephalopathy. -LP done with no signs of meningitis or encephalitis. -Currently mentating well, appears to be at or near baseline  3.  Acute/early subacute infarct -MRI brain with 2 mm subacute/acute infarct on posterior right frontal lobe and 3 mm in the callosal splenium. -Patient noted to have weakness on the left upper and lower extremities. -Patient underwent MRA angiogram head and neck with no large vessel occlusion, moderate intracranial atherosclerotic disease. -Carotid Dopplers with no significant ICA stenosis, vertebral arteries with high resistant flow. -Patient with recent 2D echo done 04/01/2021 and as such not repeated. -Hemoglobin A1c 6.2. -Fasting lipid panel with LDL of 47. -Continue Crestor, amlodipine. -Continue aspirin for secondary stroke prophylaxis.   -Neurology was following but have signed off as of  05/30/2021. -Continue with outpatient follow-up with neurology.  4.  Acute kidney injury on chronic kidney disease stage IIIa -Baseline creatinine 1.2 peaked at 2.3. -Renal function improved with hydration.   -Creatinine down to 1.6 at time of d/c  -Patient with concerns for possible volume overload and as such IV fluids have been saline locked. -Patient was given 2 doses of IV Lasix overnight, stable  5.  Hypokalemia -Potassium stable and within normal limits  6.  Metabolic acidosis -Was on oral bicarb tablets.   -Bicarb currently normal  7.  Sinus tachycardia -Was on IV Lopressor however transitioned to oral Lopressor per cardiology which patient is tolerating. -If patient oral intake worsens again will need to be placed back on IV Lopressor. -Per cardiology.  8.  Hypertension -Continue Norvasc, hydralazine, metoprolol.   9.  History of second-degree AV block/first-degree AV block -Patient was being followed by cardiology, continue beta-blocker.  10.HOCM -Being followed by cardiology who suspect LVOT obstruction and MR noted on examination. -Per cardiology avoid dehydration. -Continue beta-blocker. -IV fluids have been saline locked. -Patient tolerated doses of IV lasix -Appreciate cardiology input and recommendations. -Recommend outpatient follow-up with cardiology.  11.  Well-controlled diabetes mellitus type 2 -Hemoglobin A1c 6.2 (05/28/2021). -CBG stable  12.  Hyperlipidemia -Continue statin.  20.  Recent fall/deconditioning/debility -Being followed by PT/OT who are recommending SNF placement. -TOC consulted for placement.  14.  Moderate protein calorie malnutrition -Continue nutritional supplementation.   -Patient was on a dysphagia 1 diet and advance to a heart healthy diet which she seems to be tolerating.   -Patient more alert, SLP following.   15.  Right shoulder pain -Secondary to recent fall and  history of retrocochlear. -Plain films of the right shoulder with no acute fracture. -Continue K pad, local analgesics. -Recommend outpatient follow-up  16.  Fever/ESBL UTI -Patient noted to have low-grade temps of 100.4 early on this morning. -Chest x-ray done with cardiomegaly, marked prominence of central pulmonary vessels with increased interstitial markings both lungs concerning for CHF with pulmonary edema. -Urinalysis with large leukocytes, nitrite negative, rare bacteria, > 50 WBC. -Urine culture pos for ESBL UTI and pt received dose of fosfomycin    17.  Volume overload -Patient noted to have low-grade temp, chest x-ray done concerning for pulmonary edema. -Given doses of Lasix 20 mg IV daily x 2 doses with improvement -Patient with HOCM and per cardiology to avoid dehydration.    Discharge Instructions  Discharge Instructions     Ambulatory referral to Neurology   Complete by: As directed    An appointment is requested in approximately: 4 weeks      Allergies as of 06/06/2021       Reactions   Amoxicillin Diarrhea   Has patient had a PCN reaction causing immediate rash, facial/tongue/throat swelling, SOB or lightheadedness with hypotension: no Has patient had a PCN reaction causing severe rash involving mucus membranes or skin necrosis: no Has patient had a PCN reaction that required hospitalization: no pharmacist consult Has patient had a PCN reaction occurring within the last 10 years: yes If all of the above answers are "NO", then may proceed with Cephalosporin use.   Ampicillin Rash   - Tolerates Rocephin and Ancef - Remote occurrence; no symptoms of anaphylaxis or severe cutaneous reaction, and no additional medical attention required   Sulfa Antibiotics Rash        Medication List     STOP taking these medications    HYDROcodone-acetaminophen 5-325 MG tablet Commonly  known as: NORCO/VICODIN       TAKE these medications    acetaminophen 325 MG  tablet Commonly known as: TYLENOL Take 2 tablets (650 mg total) by mouth every 6 (six) hours as needed for mild pain (or Fever >/= 101).   amLODipine 10 MG tablet Commonly known as: NORVASC Take 1 tablet (10 mg total) by mouth daily. Start taking on: June 07, 2021   azelastine 0.1 % nasal spray Commonly known as: ASTELIN Place 2 sprays into both nostrils daily as needed for rhinitis.   fexofenadine 180 MG tablet Commonly known as: Allegra Allergy Take 1 tablet (180 mg total) by mouth daily.   glucose monitoring kit monitoring kit Use as directed to check blood sugars 1 time per day dx: e11.22   hydrALAZINE 100 MG tablet Commonly known as: APRESOLINE Take 1 tablet (100 mg total) by mouth every 8 (eight) hours.   insulin aspart 100 UNIT/ML injection Commonly known as: novoLOG Inject 0-9 Units into the skin 3 (three) times daily with meals.   LUBRICATING EYE DROPS OP Place 1 drop into both eyes daily as needed (dry eyes).   metoprolol tartrate 25 MG tablet Commonly known as: LOPRESSOR Take 0.5 tablets (12.5 mg total) by mouth 2 (two) times daily. What changed:  medication strength how much to take   ondansetron 4 MG/2ML Soln injection Commonly known as: ZOFRAN Inject 2 mLs (4 mg total) into the vein every 6 (six) hours as needed for nausea or vomiting.   OneTouch Delica Plus [id number] Misc Use to check blood sugar once daily   OneTouch Verio test strip Generic drug: glucose blood Use as directed to check blood sugars 1 time per day.   pantoprazole 40 MG tablet Commonly known as: PROTONIX Give 1 tablet by mouth 2 times a day related to gastro-esophageal reflux disease without esophagitis   prochlorperazine 5 MG tablet Commonly known as: COMPAZINE Take 1 tablet (5 mg total) by mouth every 6 (six) hours as needed for nausea or vomiting.   rosuvastatin 20 MG tablet Commonly known as: CRESTOR Take 1 tablet (20 mg total) by mouth daily.   trolamine salicylate 10  % cream Commonly known as: ASPERCREME Apply 1 application topically daily as needed for muscle pain.   Vitamin B12 500 MCG Tabs Take 500 mcg by mouth daily.   vitamin C 1000 MG tablet Take 1,000 mg by mouth daily.        Follow-up Information     Glendale Chard, MD Follow up in 2 week(s).   Specialty: Internal Medicine Why: Hospital follow up Contact information: [address] [place] [address] [phone]         Donato Heinz, MD .   Specialties: Cardiology, Radiology Contact information: [address] [place] [address] [phone]                Allergies  Allergen Reactions   Amoxicillin Diarrhea    Has patient had a PCN reaction causing immediate rash, facial/tongue/throat swelling, SOB or lightheadedness with hypotension: no Has patient had a PCN reaction causing severe rash involving mucus membranes or skin necrosis: no Has patient had a PCN reaction that required hospitalization: no pharmacist consult Has patient had a PCN reaction occurring within the last 10 years: yes If all of the above answers are "NO", then may proceed with Cephalosporin use.    Ampicillin Rash    - Tolerates Rocephin and Ancef - Remote occurrence; no symptoms of anaphylaxis  or severe cutaneous reaction, and no additional medical attention required     Sulfa Antibiotics Rash    Consultations: Neurology: Dr.Khaliqdina 05/27/2021 Cardiology: Dr. Johnsie Cancel 05/24/2021 Interventional radiology  Procedures/Studies: CT ABDOMEN PELVIS WO CONTRAST  Result Date: 05/22/2021 CLINICAL DATA:  Nausea/vomiting, altered mental status EXAM: CT ABDOMEN AND PELVIS WITHOUT CONTRAST TECHNIQUE: Multidetector CT imaging of the abdomen and pelvis was performed following the standard protocol without IV contrast. COMPARISON:  10/09/2020 FINDINGS: Lower chest: Mild subpleural scarring in the medial right lower lobe. Hepatobiliary: Unenhanced liver is  unremarkable. Gallbladder is unremarkable. No intrahepatic or extrahepatic duct dilatation. Pancreas: Within normal limits. Spleen: Within normal limits. Adrenals/Urinary Tract: Adrenal glands are within normal limits. 2.7 cm medial left upper pole renal lesion with macroscopic fat, unchanged, compatible with a benign renal angiomyolipoma. Additional 3.1 cm left lower pole renal cyst. Right kidney is within normal limits. No hydronephrosis. Bladder is within normal limits. Stomach/Bowel: Stomach is notable for a moderate hiatal hernia. No evidence of bowel obstruction. Appendix is not discretely visualized. Sigmoid diverticulosis, without evidence of diverticulitis. Vascular/Lymphatic: No evidence of abdominal aortic aneurysm. Atherosclerotic calcifications of the abdominal aorta and branch vessels. No suspicious abdominopelvic lymphadenopathy. Reproductive: Status post hysterectomy. No adnexal masses. Other: No abdominopelvic ascites. Musculoskeletal: Grade 2 anterolisthesis of L5 on S1. Mild degenerative changes of the lower lumbar spine. IMPRESSION: Sigmoid diverticulosis, without evidence of diverticulitis. Moderate hiatal hernia. Stable left renal angiomyolipoma. Electronically Signed   By: Julian Hy M.D.   On: 05/22/2021 22:50   DG Chest 2 View  Result Date: 06/04/2021 CLINICAL DATA:  Fever EXAM: CHEST - 2 VIEW COMPARISON:  Previous studies including the examination of 05/22/2021 FINDINGS: Transverse diameter of heart is increased thoracic aorta is tortuous and ectatic. There is marked prominence of central pulmonary vessels. Increased interstitial markings are seen in the parahilar regions and lower lung fields. There is poor inspiration. There is blunting of both lateral CP angles. There is no pneumothorax. IMPRESSION: Cardiomegaly. There is marked prominence of central pulmonary vessels along with increased interstitial markings in both lungs suggesting CHF with pulmonary edema. Possibility of  underlying pneumonia is not excluded. Small bilateral pleural effusions are seen. Electronically Signed   By: Elmer Picker M.D.   On: 06/04/2021 11:09   DG Chest 2 View  Result Date: 05/09/2021 CLINICAL DATA:  Fall. EXAM: CHEST - 2 VIEW COMPARISON:  04/05/2021 FINDINGS: The cardiomediastinal silhouette is within normal limits. Aortic atherosclerosis is noted. Small nodular densities in the right mid upper lung are chronic. No acute airspace consolidation, edema, pleural effusion, pneumothorax is identified. No acute osseous abnormality is seen. IMPRESSION: No active cardiopulmonary disease. Electronically Signed   By: Logan Bores M.D.   On: 05/09/2021 16:18   DG Shoulder Right  Result Date: 05/09/2021 CLINICAL DATA:  Fall.  Right shoulder pain. EXAM: RIGHT SHOULDER - 2+ VIEW COMPARISON:  Chest radiograph 03/31/2021 FINDINGS: No acute fracture or dislocation is identified. There is a chronic deformity of the lateral right clavicle with prominent acromioclavicular spurring. The chest is reported separately. IMPRESSION: No acute osseous abnormality identified. Electronically Signed   By: Logan Bores M.D.   On: 05/09/2021 16:15   CT HEAD WO CONTRAST (5MM)  Result Date: 05/26/2021 CLINICAL DATA:  Mental status change of unknown cause. Lethargy. Hypertension. EXAM: CT HEAD WITHOUT CONTRAST TECHNIQUE: Contiguous axial images were obtained from the base of the skull through the vertex without intravenous contrast. COMPARISON:  05/22/2021.  05/09/2021. FINDINGS: Brain: Old small vessel infarction in the  left cerebellum. No focal brainstem finding, other than indentation due to tortuous and ectatic vertebrobasilar vessels. Cerebral hemispheres show old infarction in the right thalamus and extensive chronic small-vessel ischemic changes throughout the cerebral hemispheric white matter. No cortical or large vessel territory infarction. No mass lesion, hemorrhage, hydrocephalus or extra-axial collection.  Vascular: There is atherosclerotic calcification of the major vessels at the base of the brain. Skull: Negative Sinuses/Orbits: Chronic inflammatory changes of the left division of the sphenoid sinus. Other sinuses clear. Orbits negative. Other: None IMPRESSION: No acute CT finding. Extensive chronic small-vessel ischemic changes throughout the brain as outlined above. Chronic inflammatory changes of the left division of the sphenoid sinus. Electronically Signed   By: Nelson Chimes M.D.   On: 05/26/2021 17:44   CT Head Wo Contrast  Result Date: 05/22/2021 CLINICAL DATA:  Head trauma EXAM: CT HEAD WITHOUT CONTRAST TECHNIQUE: Contiguous axial images were obtained from the base of the skull through the vertex without intravenous contrast. COMPARISON:  CT brain 05/09/2021, 03/31/2021 FINDINGS: Brain: No acute territorial infarction, hemorrhage or intracranial mass. Advanced atrophy. Advanced chronic small vessel ischemic changes of the white matter. Stable ventricle size. Vascular: No hyperdense vessels.  Carotid vascular calcification Skull: Normal. Negative for fracture or focal lesion. Sinuses/Orbits: No acute finding. Other: None IMPRESSION: 1. No CT evidence for acute intracranial abnormality. 2. Atrophy and chronic small vessel ischemic changes of the white matter Electronically Signed   By: Donavan Foil M.D.   On: 05/22/2021 19:56   CT Head Wo Contrast  Result Date: 05/09/2021 CLINICAL DATA:  Head trauma, mod-severe.  Fall. EXAM: CT HEAD WITHOUT CONTRAST TECHNIQUE: Contiguous axial images were obtained from the base of the skull through the vertex without intravenous contrast. COMPARISON:  03/31/2021 FINDINGS: Brain: There is no evidence of an acute infarct, intracranial hemorrhage, mass, midline shift, or extra-axial fluid collection. Confluent hypodensities in the cerebral white matter bilaterally are unchanged and nonspecific but compatible with severe chronic small vessel ischemic disease. Small  chronic infarcts in the right thalamus and left cerebellar hemisphere are unchanged. There is mild-to-moderate cerebral atrophy. Vascular: Calcified atherosclerosis at the skull base. No hyperdense vessel. Skull: No fracture or suspicious osseous lesion. Sinuses/Orbits: Chronic left sphenoid sinusitis. Clear mastoid air cells. Bilateral cataract extraction. Other: None. IMPRESSION: 1. No evidence of acute intracranial abnormality. 2. Severe chronic small vessel ischemic disease. Electronically Signed   By: Logan Bores M.D.   On: 05/09/2021 16:26   MR ANGIO HEAD WO CONTRAST  Result Date: 05/27/2021 CLINICAL DATA:  Initial evaluation for neuro deficit, stroke suspected. EXAM: MRA HEAD WITHOUT CONTRAST TECHNIQUE: Angiographic images of the Circle of Willis were acquired using MRA technique without intravenous contrast. COMPARISON:  MRI from earlier the same day. FINDINGS: Anterior circulation: Visualized distal cervical segments of the internal carotid arteries are patent with antegrade flow. Petrous and cavernous segments patent bilaterally. Atheromatous change seen involving the supraclinoid segments of both internal carotid arteries bilaterally. Associated up to approximate moderate stenosis, left worse than right. A1 segments patent bilaterally, with the left being slightly hypoplastic. Normal anterior communicating artery complex. Both ACAs patent to their distal aspects without flow-limiting stenosis. M1 segments patent bilaterally. Normal MCA bifurcations. Distal MCA branches demonstrate diffuse small vessel atheromatous irregularity but are well perfused without proximal branch occlusion. Posterior circulation: Visualized vertebral arteries are somewhat dolichoectatic and tortuous without stenosis. Right PICA origin widely patent. Left PICA origin not definitely visualized. Basilar somewhat dolichoectatic as well, most pronounced proximally. Basilar widely patent without stenosis.  Superior cerebellar  arteries are patent bilaterally. Both PCAs primarily supplied via the basilar. Moderate atheromatous change throughout the right PCA with associated moderate multifocal P2/P3 stenoses. Additional mild-to-moderate atheromatous change noted within the left PCAs well without high-grade stenosis. Anatomic variants: None significant. Other: No intracranial aneurysm. IMPRESSION: 1. Negative intracranial MRA for large vessel occlusion. 2. Moderate intracranial atherosclerotic disease involving the supraclinoid ICAs and PCAs with associated moderate multifocal stenoses as above. 3. Dolichoectatic vertebrobasilar system, suggesting chronic underlying hypertension. Electronically Signed   By: Jeannine Boga M.D.   On: 05/27/2021 23:37   MR BRAIN WO CONTRAST  Result Date: 05/27/2021 CLINICAL DATA:  Mental status change, unknown cause. Additional history provided: Mental status change, possible sepsis, recent fall. EXAM: MRI HEAD WITHOUT CONTRAST TECHNIQUE: Multiplanar, multiecho pulse sequences of the brain and surrounding structures were obtained without intravenous contrast. COMPARISON:  Head CT 05/26/2021. FINDINGS: Brain: Mild-to-moderate cerebral atrophy. Comparatively mild cerebellar atrophy. 2 mm acute/early subacute infarct with posterior right frontal lobe white matter (series 5, image 91). 3 mm acute/early subacute infarct within the callosal splenium just to the right of midline (versus artifact) (for instance as seen on series 5, image 83). Advanced multifocal T2 FLAIR hyperintense signal abnormality within the cerebral white matter, nonspecific but compatible with chronic small vessel ischemic disease. Chronic small vessel ischemic changes are also present within the bilateral basal ganglia, thalami and pons. Chronic lacunar infarcts within the right thalamus and bilateral cerebellar hemispheres. Supratentorial and infratentorial chronic microhemorrhages, most notably within the bilateral thalami,  within the brainstem and within the bilateral cerebellar hemispheres. These likely reflect sequela of chronic hypertensive microangiopathy. No evidence of an intracranial mass. No extra-axial fluid collection. No midline shift. Vascular: Maintained flow voids within the proximal large arterial vessels. Skull and upper cervical spine: No focal suspicious marrow lesion. Incompletely assessed cervical spondylosis. Mild C3-C4 grade 1 anterolisthesis. Sinuses/Orbits: Visualized orbits show no acute finding. Bilateral lens replacements. Mild mucosal thickening within the right frontoethmoidal recess. Moderate fluid level within the left sphenoid sinus. IMPRESSION: 2 mm acute/early subacute infarct within the posterior right frontal lobe white matter. An additional 3 mm acute/early subacute infarct is questioned within the callosal splenium (versus artifact). Advanced chronic small vessel ischemic disease within the cerebral white matter. Chronic small vessel ischemic changes are also present within the bilateral basal ganglia, thalami and pons. Chronic lacunar infarcts within the right thalamus and bilateral cerebellar hemispheres. Supratentorial and infratentorial chronic microhemorrhages, most notably within the thalami, brainstem and cerebellar hemispheres. These findings likely reflect sequela of chronic hypertensive microangiopathy. Mild-to-moderate generalized cerebral atrophy. Comparatively mild cerebellar atrophy. Incompletely assessed cervical spondylosis. Mild C3-C4 grade 1 anterolisthesis. Paranasal sinus disease, as described. Electronically Signed   By: Kellie Simmering D.O.   On: 05/27/2021 11:22   MR CERVICAL SPINE WO CONTRAST  Result Date: 05/27/2021 CLINICAL DATA:  Initial evaluation for myelopathy, acute or progressive by epidural abscess. EXAM: MRI CERVICAL SPINE WITHOUT CONTRAST TECHNIQUE: Multiplanar, multisequence MR imaging of the cervical spine was performed. No intravenous contrast was  administered. COMPARISON:  CT from 03/31/2021. FINDINGS: Alignment: Straightening of the normal cervical lordosis. Associated stepwise grade 1 anterolisthesis of C3 on C4, C4 on C5, C7 on T1, and T2 on T3, likely chronic and facet mediated. Vertebrae: Vertebral body height maintained without acute or chronic fracture. Bone marrow signal intensity within normal limits. No discrete or worrisome osseous lesions. No findings to suggest osteomyelitis discitis or septic arthritis. Cord: Normal signal and morphology. No cord signal changes to suggest  myelopathy. No epidural abscess or other collection. Posterior Fossa, vertebral arteries, paraspinal tissues: Chronic microvascular ischemic changes noted within the visualized pons/brainstem. Craniocervical junction normal. Paraspinous and prevertebral soft tissues within normal limits. Normal flow voids seen within the vertebral arteries bilaterally. Disc levels: C2-C3: Mild disc bulge. Mild left greater than right facet hypertrophy. No canal or foraminal stenosis. C3-C4: Grade 1 anterolisthesis with intervertebral disc space narrowing and diffuse disc bulge. Right paracentral disc protrusion indents the right ventral thecal sac, contacting the ventral cord (series 9, image 8). Minimal cord flattening without cord signal changes. Superimposed left-sided uncovertebral and facet hypertrophy with resultant moderate left C4 foraminal stenosis. Right neural foramina remains patent. C4-C5: Degenerative intervertebral disc space narrowing with grade 1 anterolisthesis. Broad-based left paracentral disc osteophyte complex indents the ventral thecal sac, contacting and minimally flattening the ventral cord. No cord signal changes. Mild spinal stenosis. Superimposed mild right-sided facet degeneration. No significant foraminal encroachment. C5-C6: Advanced degenerative intervertebral disc space narrowing with diffuse disc osteophyte complex. Broad posterior component flattens and  partially faces the ventral thecal sac with resultant mild spinal stenosis, asymmetric to the left. Mild cord flattening without cord signal changes. Mild left greater than right C6 foraminal narrowing. C6-C7: Degenerative intervertebral disc space narrowing with diffuse disc osteophyte complex. Broad posterior component flattens and partially effaces the ventral thecal sac with resultant mild spinal stenosis. Borderline mild left C7 foraminal narrowing. Right neural foramina remains patent. C7-T1: Trace anterolisthesis. Mild disc bulge with uncovertebral spurring. Mild facet and ligament flavum hypertrophy. No spinal stenosis. Mild left C8 foraminal narrowing. Right neural foramen remains patent. Visualized upper thoracic spine demonstrates no significant finding. IMPRESSION: 1. No acute abnormality within the cervical spine. No evidence for acute infection or epidural abscess. 2. Normal MRI appearance of the cervical spinal cord. No evidence for myelopathy. 3. Multilevel cervical spondylosis with resultant mild spinal stenosis at C3-4 through C6-7. Associated moderate left C4 foraminal stenosis, with mild bilateral C6 and left C7 foraminal narrowing. Electronically Signed   By: Jeannine Boga M.D.   On: 05/27/2021 23:46   US RENAL  Result Date: 05/28/2021 CLINICAL DATA:  Acute renal injury EXAM: RENAL / URINARY TRACT ULTRASOUND COMPLETE COMPARISON:  CT scan 05/22/2021 FINDINGS: Right Kidney: Renal measurements: 8.3 x 4.3 x 4.0 cm = volume: 76 mL. 1.6 cm cyst upper pole. Increased echogenicity. No hydronephrosis. Left Kidney: Renal measurements: 7.3 x 4.0 x 4.1 cm = volume: 63 mL. 2.7 cm cyst noted lower pole. 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 Increased echogenicity. No hydronephrosis. Bladder: Appears normal for degree of bladder distention. Other: None. IMPRESSION: 1. No hydronephrosis. 2. Increased renal parenchymal echogenicity consistent with  medical renal disease. Electronically Signed   By: Misty Stanley M.D.   On: 05/28/2021 14:27   DG Chest Port 1 View  Result Date: 05/22/2021 CLINICAL DATA:  Nausea vomiting EXAM: PORTABLE CHEST 1 VIEW COMPARISON:  05/09/2021 FINDINGS: The heart size and mediastinal contours are within normal limits. Aortic atherosclerosis. Both lungs are clear. The visualized skeletal structures are unremarkable. IMPRESSION: No active disease. Electronically Signed   By: Donavan Foil M.D.   On: 05/22/2021 19:57   DG Swallowing Func-Speech Pathology  Result Date: 06/03/2021 Table formatting from the original result was not included. Objective Swallowing Evaluation: Type of Study: MBS-Modified Barium Swallow Study  Patient Details Name: Shanell Aden MRN: [id number] Date of Birth: 06/09/1935 Today's Date: 06/03/2021 Time: SLP Start Time (ACUTE ONLY): 9373 -SLP Stop  Time (ACUTE ONLY): 1240 SLP Time Calculation (min) (ACUTE ONLY): 20 min Past Medical History: Past Medical History: Diagnosis Date  Arthritis   Diabetes mellitus without complication ([place])   Hypertension   Hypokalemia   Pneumonia   Shingles   Stroke [facility])  Past Surgical History: Past Surgical History: Procedure Laterality Date  ABDOMINAL HYSTERECTOMY    BREAST EXCISIONAL BIOPSY Left   ESOPHAGOGASTRODUODENOSCOPY N/A 09/01/2014  Procedure: ESOPHAGOGASTRODUODENOSCOPY (EGD);  Surgeon: Lear Ng, MD;  Location: [facility];  Service: Endoscopy;  Laterality: N/A;  ESOPHAGOGASTRODUODENOSCOPY (EGD) WITH PROPOFOL N/A 10/12/2020  Procedure: ESOPHAGOGASTRODUODENOSCOPY (EGD) WITH PROPOFOL;  Surgeon: Mauri Pole, MD;  Location: [facility];  Service: Endoscopy;  Laterality: N/A;  HIATAL HERNIA REPAIR N/A 09/04/2014  Procedure: LAPAROSCOPIC REPAIR OF HIATAL HERNIA;  Surgeon: Excell Seltzer, MD;  Location: [facility];  Service: General;  Laterality: N/A;  With MESH  IR GENERIC HISTORICAL  04/10/2016  IR US GUIDE VASC ACCESS RIGHT 04/10/2016 Corrie Mckusick, DO [facility]  IR GENERIC HISTORICAL  04/10/2016  IR ANGIOGRAM SELECTIVE EACH ADDITIONAL VESSEL 04/10/2016 Corrie Mckusick, DO [facility]  IR GENERIC HISTORICAL  04/10/2016  IR ANGIOGRAM SELECTIVE EACH ADDITIONAL VESSEL 04/10/2016 Corrie Mckusick, DO [facility]  IR GENERIC HISTORICAL  04/10/2016  IR EMBO TUMOR ORGAN ISCHEMIA INFARCT INC GUIDE ROADMAPPING 04/10/2016 Corrie Mckusick, DO [facility]  IR GENERIC HISTORICAL  04/10/2016  IR ANGIOGRAM SELECTIVE EACH ADDITIONAL VESSEL 04/10/2016 Corrie Mckusick, DO [facility]  IR GENERIC HISTORICAL  04/10/2016  IR ANGIOGRAM SELECTIVE EACH ADDITIONAL VESSEL 04/10/2016 Corrie Mckusick, DO [facility]  IR GENERIC HISTORICAL  04/10/2016  IR RENAL SELECTIVE  UNI INC S&I MOD SED 04/10/2016 Corrie Mckusick, DO [facility]  IR GENERIC HISTORICAL  03/06/2016  IR RADIOLOGIST EVAL & MGMT 03/06/2016 Corrie Mckusick, DO [facility]  IR GENERIC HISTORICAL  03/26/2016  IR RADIOLOGIST EVAL & MGMT 03/26/2016 Corrie Mckusick, DO [facility]  IR GENERIC HISTORICAL  04/29/2016  IR RADIOLOGIST EVAL & MGMT 04/29/2016 [facility]  IR RADIOLOGIST EVAL & MGMT  11/12/2016  IR RADIOLOGIST EVAL & MGMT  12/16/2017  KNEE SURGERY    TONSILLECTOMY   HPI: Pt is an 86 yo female adm to [facility] with N/V. Pt has h/o esophagitis, HH, CKD Stage 3A, cardiomyopathy, DM, colitis, HTN, right lobe pna, ICH. CT head Small chronic infarcts in the right  thalamus and left cerebellar hemisphere are unchanged. PT with several admits to hospital in last several months.  Most recent for fall.  Subjective: alert, cooperative Assessment / Plan / Recommendation CHL IP CLINICAL IMPRESSIONS 06/03/2021 Clinical Impression Patient presents with a mild oropharyngeal dysphagia as per this MBS. During oral phase of swallow, she exhibited decreased mastication and piecemeal swallowing with regular solids and mildly delayed anterior to posterior transit with puree solids. During pharyngeal phase of swallow, patient exhibited swallow initiation delay to level of vallecular  sinus with puree and regular texture solids and swallow initiation delay to level of pyriform sinus with thin liquids. Airway was well protected and no evidence of penetration or aspiration of thin liquid, puree or regular texture solids boluses observed. Trace to mild oral and trace pharyngeal (vallecular sinus) residuals observed with solids however these cleared with subsequent swallows. Patient did exhibit a few instances of cough response, however this after swallows when fluro was turned off. Radiology technician turned fluro back on for two of these 3 instances, however no evidence of barium in pharynx, laryngeal vestibule or trachea. Cricopharyngeal bar observed however this  did not appear to significantly impede bolus transit. No observed esophageal backflow observed and no barium retention observed in upper esophagus (upper thoracic level). SLP is recommending patient be downgraded slightly to Dys 3 solids, continue with thin liquids. SLP Visit Diagnosis Dysphagia, oropharyngeal phase (R13.12) Attention and concentration deficit following -- Frontal lobe and executive function deficit following -- Impact on safety and function Mild aspiration risk   CHL IP TREATMENT RECOMMENDATION 06/03/2021 Treatment Recommendations Therapy as outlined in treatment plan below   Prognosis 06/03/2021 Prognosis for Safe Diet Advancement Good Barriers to Reach Goals -- Barriers/Prognosis Comment -- CHL IP DIET RECOMMENDATION 06/03/2021 SLP Diet Recommendations Thin liquid Liquid Administration via Cup;Straw Medication Administration Whole meds with liquid Compensations Slow rate;Small sips/bites;Minimize environmental distractions;Follow solids with liquid Postural Changes Remain semi-upright after after feeds/meals (Comment);Seated upright at 90 degrees   CHL IP OTHER RECOMMENDATIONS 06/03/2021 Recommended Consults -- Oral Care Recommendations Oral care BID;Staff/trained caregiver to provide oral care Other Recommendations  --   CHL IP FOLLOW UP RECOMMENDATIONS 06/03/2021 Follow up Recommendations None   CHL IP FREQUENCY AND DURATION 06/03/2021 Speech Therapy Frequency (ACUTE ONLY) min 1 x/week Treatment Duration 1 week      CHL IP ORAL PHASE 06/03/2021 Oral Phase Impaired Oral - Pudding Teaspoon -- Oral - Pudding Cup -- Oral - Honey Teaspoon -- Oral - Honey Cup -- Oral - Nectar Teaspoon -- Oral - Nectar Cup -- Oral - Nectar Straw -- Oral - Thin Teaspoon -- Oral - Thin Cup WFL Oral - Thin Straw WFL Oral - Puree Weak lingual manipulation;Reduced posterior propulsion Oral - Mech Soft Weak lingual manipulation;Reduced posterior propulsion;Impaired mastication;Piecemeal swallowing Oral - Regular -- Oral - Multi-Consistency -- Oral - Pill WFL Oral Phase - Comment --  CHL IP PHARYNGEAL PHASE 06/03/2021 Pharyngeal Phase Impaired Pharyngeal- Pudding Teaspoon -- Pharyngeal -- Pharyngeal- Pudding Cup -- Pharyngeal -- Pharyngeal- Honey Teaspoon -- Pharyngeal -- Pharyngeal- Honey Cup -- Pharyngeal -- Pharyngeal- Nectar Teaspoon -- Pharyngeal -- Pharyngeal- Nectar Cup -- Pharyngeal -- Pharyngeal- Nectar Straw -- Pharyngeal -- Pharyngeal- Thin Teaspoon -- Pharyngeal -- Pharyngeal- Thin Cup Delayed swallow initiation-pyriform sinuses Pharyngeal -- Pharyngeal- Thin Straw Delayed swallow initiation-pyriform sinuses Pharyngeal -- Pharyngeal- Puree Delayed swallow initiation-vallecula Pharyngeal -- Pharyngeal- Mechanical Soft Delayed swallow initiation-vallecula;Pharyngeal residue - valleculae Pharyngeal -- Pharyngeal- Regular -- Pharyngeal -- Pharyngeal- Multi-consistency -- Pharyngeal -- Pharyngeal- Pill WFL Pharyngeal -- Pharyngeal Comment --  CHL IP CERVICAL ESOPHAGEAL PHASE 06/03/2021 Cervical Esophageal Phase Impaired Pudding Teaspoon -- Pudding Cup -- Honey Teaspoon -- Honey Cup -- Nectar Teaspoon -- Nectar Cup -- Nectar Straw -- Thin Teaspoon -- Thin Cup Prominent cricopharyngeal segment Thin Straw Prominent cricopharyngeal segment Puree  Prominent cricopharyngeal segment Mechanical Soft -- Regular Prominent cricopharyngeal segment Multi-consistency -- Pill Prominent cricopharyngeal segment Cervical Esophageal Comment -- Sonia Baller, MA, CCC-SLP Speech Therapy              EEG adult  Result Date: 05/27/2021 Lora Havens, MD     05/27/2021  4:07 PM Patient Name: Tykera Skates MRN: [id number] Epilepsy Attending: Lora Havens Referring Physician/Provider: Dr Donnetta Simpers Date: 05/27/2021 Duration: 22.48 mins Patient history: 86 year old female with altered mental status.  EEG to evaluate for seizures. Level of alertness: Awake AEDs during EEG study: None Technical aspects: This EEG study was done with scalp electrodes positioned according to the 10-20 International system of electrode placement. Electrical activity was acquired at a sampling rate of _0  and reviewed with a high frequency filter of _1  and  a low frequency filter of _0 . EEG data were recorded continuously and digitally stored. Description: No posterior dominant rhythm was seen. EEG showed continuous generalized 3 to 6 Hz theta-delta slowing. Generalized periodic discharges with triphasic morphology at  _1  were also noted, more prominent when awake/stimulated. Hyperventilation and photic stimulation were not performed.   ABNORMALITY - Periodic discharges with triphasic morphology, generalized ( GPDs) - Continuous slow, generalized IMPRESSION: This study showed generalized periodic discharges with triphasic morphology at  _2  which is on the ictal-interictal continuum. However, the morphology, frequency and reactivity to stimulation is more commonly seen due to toxic-metabolic causes like cefepime toxicity, hyerammonemia, renal failure. Additionally there is moderate to severe diffuse encephalopathy, nonspecific etiology. No seizures were seen throughout the recording. Priyanka O Yadav   VAS US CAROTID  Result Date: 05/28/2021 Carotid Arterial Duplex Study  Patient Name:  LEEANN BADY Loscalzo  Date of Exam:   05/28/2021 Medical Rec #: [id number]        Accession #:    [id number] Date of Birth: 1935-04-14         Patient Gender: F Patient Age:   61 years Exam Location:  [facility] Procedure:      VAS US CAROTID Referring Phys: Alferd Patee [facility] --------------------------------------------------------------------------------  Indications:       CVA. Risk Factors:      Hyperlipidemia, Diabetes. Limitations        Today's exam was limited due to the patient's respiratory                    variation and patient positioning, patient immobility. Comparison Study:  04/01/2021 - Right Carotid: Velocities in the right ICA are                    consistent with a 1-39% stenosis.                     Left Carotid: Velocities in the left ICA are consistent with                    a 1-39%                    stenosis.                     Vertebrals: Bilateral vertebral arteries demonstrate                    antegrade flow. Performing Technologist: Oliver Hum RVT  Examination Guidelines: A complete evaluation includes B-mode imaging, spectral Doppler, color Doppler, and power Doppler as needed of all accessible portions of each vessel. Bilateral testing is considered an integral part of a complete examination. Limited examinations for reoccurring indications may be performed as noted.  Right Carotid Findings: +----------+--------+--------+--------+-----------------------+--------+           PSV cm/sEDV cm/sStenosisPlaque Description     Comments +----------+--------+--------+--------+-----------------------+--------+ CCA Prox  70      11              smooth and heterogenoustortuous +----------+--------+--------+--------+-----------------------+--------+ CCA Distal96      16              smooth and heterogenous         +----------+--------+--------+--------+-----------------------+--------+ ICA Prox  63      17              smooth and heterogenous          +----------+--------+--------+--------+-----------------------+--------+  ICA Distal49      12                                     tortuous +----------+--------+--------+--------+-----------------------+--------+ ECA       139     14                                              +----------+--------+--------+--------+-----------------------+--------+ +----------+--------+-------+--------+-------------------+           PSV cm/sEDV cmsDescribeArm Pressure (mmHG) +----------+--------+-------+--------+-------------------+ Subclavian120                                        +----------+--------+-------+--------+-------------------+ +---------+--------+--+--------+--+----------------------------+ VertebralPSV cm/s81EDV cm/s20Antegrade and High resistant +---------+--------+--+--------+--+----------------------------+  Left Carotid Findings: +----------+--------+--------+--------+-----------------------+--------+           PSV cm/sEDV cm/sStenosisPlaque Description     Comments +----------+--------+--------+--------+-----------------------+--------+ CCA Prox  105     11              smooth and heterogenoustortuous +----------+--------+--------+--------+-----------------------+--------+ CCA Distal69      12              smooth and heterogenous         +----------+--------+--------+--------+-----------------------+--------+ ICA Prox  45      8               calcific                        +----------+--------+--------+--------+-----------------------+--------+ ICA Distal68      19                                     tortuous +----------+--------+--------+--------+-----------------------+--------+ ECA       70      4                                               +----------+--------+--------+--------+-----------------------+--------+ +----------+--------+--------+--------+-------------------+           PSV cm/sEDV cm/sDescribeArm Pressure  (mmHG) +----------+--------+--------+--------+-------------------+ Subclavian115                                         +----------+--------+--------+--------+-------------------+ +---------+--------+--+--------+-+----------------------------+ VertebralPSV cm/s35EDV cm/s6Antegrade and High resistant +---------+--------+--+--------+-+----------------------------+   Summary: Right Carotid: Velocities in the right ICA are consistent with a 1-39% stenosis. Left Carotid: Velocities in the left ICA are consistent with a 1-39% stenosis. Vertebrals: Bilateral vertebral arteries demonstrate high resistant flow. *See table(s) above for measurements and observations.  Electronically signed by Harold Barban MD on 05/28/2021 at 9:15:34 PM.    Final    LONG TERM MONITOR-LIVE TELEMETRY (3-14 DAYS)  Result Date: 05/23/2021  1 episode of NSVT lasting 4 beats  Second-degree AV block Mobitz 1 was present  Patch Wear Time:  13 days and 23 hours (2022-09-20T16:32:05-399 to 2022-10-04T15:56:19-0400) Patient had a min HR of 39 bpm, max HR of 154 bpm, and avg HR of 79 bpm.  Predominant underlying rhythm was Sinus Rhythm. First Degree AV Block was present. 1 run of Ventricular Tachycardia occurred lasting 4 beats with a max rate of 154 bpm (avg 113 bpm). Second Degree AV Block-Mobitz I (Wenckebach) was present. Isolated SVEs were rare (<1.0%), SVE Couplets were rare (<1.0%), and no SVE Triplets were present. Isolated VEs were rare (<1.0%), VE Couplets were rare (<1.0%), and no VE Triplets were present. Ventricular Bigeminy was present.  1 patient triggered event, corresponding to sinus rhythm with PACs   DG FLUORO GUIDE LUMBAR PUNCTURE  Result Date: 05/29/2021 CLINICAL DATA:  Altered mental status EXAM: DIAGNOSTIC LUMBAR PUNCTURE UNDER FLUOROSCOPIC GUIDANCE FLUOROSCOPY TIME:  Radiation Exposure Index (as provided by the fluoroscopic device): 11.9 mGy If the device does not provide the exposure index: Fluoroscopy  Time (in minutes and seconds):  42 seconds Number of Acquired Images:  1 PROCEDURE: Informed consent was obtained from the patient prior to the procedure, including potential complications of headache, allergy, and pain. With the patient prone, the lower back was prepped with Betadine. 1% Lidocaine was used for local anesthesia. Lumbar puncture was performed at the L2-L3 level using a 20 gauge needle with return of clear CSF with an estimated opening pressure of 18 cm water. 10 ml of CSF were obtained for laboratory studies. The patient tolerated the procedure well and there were no apparent complications. IMPRESSION: Successful lumbar puncture. Electronically Signed   By: Suzy Bouchard M.D.   On: 05/29/2021 16:30    Subjective: Feels well today  Discharge Exam: Vitals:   06/06/21 0944 06/06/21 1154  BP: 135/62 (!) 148/74  Pulse: 96 94  Resp:  17  Temp:  98.1 F (36.7 C)  SpO2:  93%   Vitals:   06/05/21 2019 06/06/21 0519 06/06/21 0944 06/06/21 1154  BP: (!) 154/70 137/69 135/62 (!) 148/74  Pulse: 93 (!) 103 96 94  Resp: _0 Temp: 98.4 F (36.9 C) 98.5 F (36.9 C)  98.1 F (36.7 C)  TempSrc: Oral Oral  Oral  SpO2: 95% 93%  93%  Weight:      Height:        General: Pt is alert, awake, not in acute distress Cardiovascular: RRR, S1/S2 + Respiratory: CTA bilaterally, no wheezing, no rhonchi Abdominal: Soft, NT, ND, bowel sounds + Extremities: no edema, no cyanosis   The results of significant diagnostics from this hospitalization (including imaging, microbiology, ancillary and laboratory) are listed below for reference.     Microbiology: Recent Results (from the past 240 hour(s))  CSF culture w Gram Stain     Status: None   Collection Time: 05/29/21  3:22 PM   Specimen: PATH Cytology CSF; Cerebrospinal Fluid  Result Value Ref Range Status   Specimen Description   Final    CSF Performed at [facility], [place] [address] [place], [place]  [address]    Special Requests   Final    NONE Performed at [facility], [place] [address] [place], [place] [address]    Gram Stain   Final    NO WBC SEEN NO ORGANISMS SEEN CYTOSPIN SMEAR Gram Stain Report Called to,Read Back By and Verified With: C.ACHUSIM, RN AT 5615 ON 10.26.22 BY N.THOMPSON Performed at [facility] [address], [place], [place] [address]    Culture   Final    NO GROWTH 3 DAYS Performed at [facility], [place] [address] [place], [place] [address]    Report  Status 06/02/2021 FINAL  Final  Resp Panel by RT-PCR (Flu A&B, Covid) Nasopharyngeal Swab     Status: None   Collection Time: 06/03/21 12:53 PM   Specimen: Nasopharyngeal Swab; Nasopharyngeal(NP) swabs in vial transport medium  Result Value Ref Range Status   SARS Coronavirus 2 by RT PCR NEGATIVE NEGATIVE Final    Comment: (NOTE) SARS-CoV-2 target nucleic acids are NOT DETECTED.  The SARS-CoV-2 RNA is generally detectable in upper respiratory specimens during the acute phase of infection. The lowest concentration of SARS-CoV-2 viral copies this assay can detect is 138 copies/mL. A negative result does not preclude SARS-Cov-2 infection and should not be used as the sole basis for treatment or other patient management decisions. A negative result may occur with  improper specimen collection/handling, submission of specimen other than nasopharyngeal swab, presence of viral mutation(s) within the areas targeted by this assay, and inadequate number of viral copies(<138 copies/mL). A negative result must be combined with clinical observations, patient history, and epidemiological information. The expected result is Negative.  Fact Sheet for Patients:  EntrepreneurPulse.com.au  Fact Sheet for Healthcare Providers:  IncredibleEmployment.be  This test is no t yet approved or cleared by the [place] FDA and  has been authorized  for detection and/or diagnosis of SARS-CoV-2 by FDA under an Emergency Use Authorization (EUA). This EUA will remain  in effect (meaning this test can be used) for the duration of the COVID-19 declaration under Section 564(b)(1) of the Act, 21 U.S.C.section 360bbb-3(b)(1), unless the authorization is terminated  or revoked sooner.       Influenza A by PCR NEGATIVE NEGATIVE Final   Influenza B by PCR NEGATIVE NEGATIVE Final    Comment: (NOTE) The Xpert Xpress SARS-CoV-2/FLU/RSV plus assay is intended as an aid in the diagnosis of influenza from Nasopharyngeal swab specimens and should not be used as a sole basis for treatment. Nasal washings and aspirates are unacceptable for Xpert Xpress SARS-CoV-2/FLU/RSV testing.  Fact Sheet for Patients: EntrepreneurPulse.com.au  Fact Sheet for Healthcare Providers: IncredibleEmployment.be  This test is not yet approved or cleared by the [place] FDA and has been authorized for detection and/or diagnosis of SARS-CoV-2 by FDA under an Emergency Use Authorization (EUA). This EUA will remain in effect (meaning this test can be used) for the duration of the COVID-19 declaration under Section 564(b)(1) of the Act, 21 U.S.C. section 360bbb-3(b)(1), unless the authorization is terminated or revoked.  Performed at [facility], [place] [address], [place], [place] [address]   Urine Culture     Status: Abnormal   Collection Time: 06/04/21  8:03 AM   Specimen: Urine, Catheterized  Result Value Ref Range Status   Specimen Description   Final    URINE, CATHETERIZED Performed at [facility] [address], [place], [place] [address]    Special Requests   Final    NONE Performed at [facility], [place] [address] [place], [place] [address]    Culture (A)  Final    >=100,000 COLONIES/mL ESCHERICHIA COLI Confirmed Extended Spectrum Beta-Lactamase Producer  (ESBL).  In bloodstream infections from ESBL organisms, carbapenems are preferred over piperacillin/tazobactam. They are shown to have a lower risk of mortality.    Report Status 06/06/2021 FINAL  Final   Organism ID, Bacteria ESCHERICHIA COLI (A)  Final      Susceptibility   Escherichia coli - MIC*    AMPICILLIN >=32 RESISTANT Resistant     CEFAZOLIN >=64 RESISTANT Resistant  CEFEPIME 2 SENSITIVE Sensitive     CEFTRIAXONE >=64 RESISTANT Resistant     CIPROFLOXACIN >=4 RESISTANT Resistant     GENTAMICIN <=1 SENSITIVE Sensitive     IMIPENEM <=0.25 SENSITIVE Sensitive     NITROFURANTOIN <=16 SENSITIVE Sensitive     TRIMETH/SULFA >=320 RESISTANT Resistant     AMPICILLIN/SULBACTAM >=32 RESISTANT Resistant     PIP/TAZO <=4 SENSITIVE Sensitive     * >=100,000 COLONIES/mL ESCHERICHIA COLI  Resp Panel by RT-PCR (Flu A&B, Covid) Nasopharyngeal Swab     Status: None   Collection Time: 06/06/21  9:35 AM   Specimen: Nasopharyngeal Swab; Nasopharyngeal(NP) swabs in vial transport medium  Result Value Ref Range Status   SARS Coronavirus 2 by RT PCR NEGATIVE NEGATIVE Final    Comment: (NOTE) SARS-CoV-2 target nucleic acids are NOT DETECTED.  The SARS-CoV-2 RNA is generally detectable in upper respiratory specimens during the acute phase of infection. The lowest concentration of SARS-CoV-2 viral copies this assay can detect is 138 copies/mL. A negative result does not preclude SARS-Cov-2 infection and should not be used as the sole basis for treatment or other patient management decisions. A negative result may occur with  improper specimen collection/handling, submission of specimen other than nasopharyngeal swab, presence of viral mutation(s) within the areas targeted by this assay, and inadequate number of viral copies(<138 copies/mL). A negative result must be combined with clinical observations, patient history, and epidemiological information. The expected result is Negative.  Fact  Sheet for Patients:  EntrepreneurPulse.com.au  Fact Sheet for Healthcare Providers:  IncredibleEmployment.be  This test is no t yet approved or cleared by the [place] FDA and  has been authorized for detection and/or diagnosis of SARS-CoV-2 by FDA under an Emergency Use Authorization (EUA). This EUA will remain  in effect (meaning this test can be used) for the duration of the COVID-19 declaration under Section 564(b)(1) of the Act, 21 U.S.C.section 360bbb-3(b)(1), unless the authorization is terminated  or revoked sooner.       Influenza A by PCR NEGATIVE NEGATIVE Final   Influenza B by PCR NEGATIVE NEGATIVE Final    Comment: (NOTE) The Xpert Xpress SARS-CoV-2/FLU/RSV plus assay is intended as an aid in the diagnosis of influenza from Nasopharyngeal swab specimens and should not be used as a sole basis for treatment. Nasal washings and aspirates are unacceptable for Xpert Xpress SARS-CoV-2/FLU/RSV testing.  Fact Sheet for Patients: EntrepreneurPulse.com.au  Fact Sheet for Healthcare Providers: IncredibleEmployment.be  This test is not yet approved or cleared by the [place] FDA and has been authorized for detection and/or diagnosis of SARS-CoV-2 by FDA under an Emergency Use Authorization (EUA). This EUA will remain in effect (meaning this test can be used) for the duration of the COVID-19 declaration under Section 564(b)(1) of the Act, 21 U.S.C. section 360bbb-3(b)(1), unless the authorization is terminated or revoked.  Performed at [facility], [place] [address], [place], [place] [address]      Labs: BNP (last 3 results) No results for input(s): BNP in the last 8760 hours. Basic Metabolic Panel: Recent Labs  Lab 05/31/21 0418 06/01/21 0445 06/02/21 0501 06/03/21 0451 06/04/21 0429 06/05/21 0441 06/06/21 0428  NA 136 133* 135 134* 133* 134* 133*  K 3.6 3.7 3.8  4.0 3.9 3.7 3.8  CL 107 105 103 101 101 100 102  CO2 22 19* _0 GLUCOSE 105* 94 100* 126* 130* 116* 120*  BUN 36* 31* 31* 36* 34* 30* 30*  CREATININE  1.78* 1.66* 1.57* 1.83* 1.91* 2.12* 1.61*  CALCIUM 8.7* 8.7* 8.5* 8.6* 8.4* 8.7* 8.2*  MG 2.1 2.0  --  2.2 2.0 2.0  --    Liver Function Tests: Recent Labs  Lab 06/06/21 0428  AST 28  ALT 36  ALKPHOS 61  BILITOT 0.4  PROT 5.8*  ALBUMIN 2.6*   No results for input(s): LIPASE, AMYLASE in the last 168 hours. No results for input(s): AMMONIA in the last 168 hours. CBC: Recent Labs  Lab 05/31/21 0418 06/01/21 0445 06/02/21 0501 06/05/21 0441 06/06/21 0428  WBC 11.5* 11.5* 11.5* 13.3* 10.7*  NEUTROABS 8.4*  --   --   --   --   HGB 10.5* 11.0* 10.4* 10.5* 10.0*  HCT 33.3* 34.7* 32.6* 33.0* 31.3*  MCV 91.5 92.0 89.6 91.2 92.1  PLT 283 300 349 422* 418*   Cardiac Enzymes: No results for input(s): CKTOTAL, CKMB, CKMBINDEX, TROPONINI in the last 168 hours. BNP: Invalid input(s): POCBNP CBG: Recent Labs  Lab 06/04/21 2205 06/05/21 1120 06/05/21 1616 06/05/21 2021 06/06/21 0727  GLUCAP 172* 121* 123* 165* 136*   D-Dimer No results for input(s): DDIMER in the last 72 hours. Hgb A1c No results for input(s): HGBA1C in the last 72 hours. Lipid Profile No results for input(s): CHOL, HDL, LDLCALC, TRIG, CHOLHDL, LDLDIRECT in the last 72 hours. Thyroid function studies No results for input(s): TSH, T4TOTAL, T3FREE, THYROIDAB in the last 72 hours.  Invalid input(s): FREET3 Anemia work up No results for input(s): VITAMINB12, FOLATE, FERRITIN, TIBC, IRON, RETICCTPCT in the last 72 hours. Urinalysis    Component Value Date/Time   COLORURINE YELLOW 06/04/2021 0803   APPEARANCEUR HAZY (A) 06/04/2021 0803   LABSPEC 1.009 06/04/2021 0803   PHURINE 8.0 06/04/2021 0803   GLUCOSEU NEGATIVE 06/04/2021 0803   HGBUR SMALL (A) 06/04/2021 0803   BILIRUBINUR NEGATIVE 06/04/2021 0803   BILIRUBINUR Negative 11/13/2020 0738    KETONESUR NEGATIVE 06/04/2021 0803   PROTEINUR NEGATIVE 06/04/2021 0803   UROBILINOGEN 0.2 11/13/2020 0738   UROBILINOGEN 0.2 09/01/2014 0300   NITRITE NEGATIVE 06/04/2021 0803   LEUKOCYTESUR LARGE (A) 06/04/2021 0803   Sepsis Labs Invalid input(s): PROCALCITONIN,  WBC,  LACTICIDVEN Microbiology Recent Results (from the past 240 hour(s))  CSF culture w Gram Stain     Status: None   Collection Time: 05/29/21  3:22 PM   Specimen: PATH Cytology CSF; Cerebrospinal Fluid  Result Value Ref Range Status   Specimen Description   Final    CSF Performed at [facility], [place] [address] [place], [place] [address]    Special Requests   Final    NONE Performed at [facility], [place] [address] [place], [place] [address]    Gram Stain   Final    NO WBC SEEN NO ORGANISMS SEEN CYTOSPIN SMEAR Gram Stain Report Called to,Read Back By and Verified With: C.ACHUSIM, RN AT 1224 ON 10.26.22 BY N.THOMPSON Performed at [place] [address], [place], [place] [address]    Culture   Final    NO GROWTH 3 DAYS Performed at [facility], [place] [address] [place], [place] [address]    Report Status 06/02/2021 FINAL  Final  Resp Panel by RT-PCR (Flu A&B, Covid) Nasopharyngeal Swab     Status: None   Collection Time: 06/03/21 12:53 PM   Specimen: Nasopharyngeal Swab; Nasopharyngeal(NP) swabs in vial transport medium  Result Value Ref Range Status   SARS Coronavirus 2 by RT PCR  NEGATIVE NEGATIVE Final    Comment: (NOTE) SARS-CoV-2 target nucleic acids are NOT DETECTED.  The SARS-CoV-2 RNA is generally detectable in upper respiratory specimens during the acute phase of infection. The lowest concentration of SARS-CoV-2 viral copies this assay can detect is 138 copies/mL. A negative result does not preclude SARS-Cov-2 infection and should not be used as the sole basis for treatment or other patient management decisions. A negative  result may occur with  improper specimen collection/handling, submission of specimen other than nasopharyngeal swab, presence of viral mutation(s) within the areas targeted by this assay, and inadequate number of viral copies(<138 copies/mL). A negative result must be combined with clinical observations, patient history, and epidemiological information. The expected result is Negative.  Fact Sheet for Patients:  EntrepreneurPulse.com.au  Fact Sheet for Healthcare Providers:  IncredibleEmployment.be  This test is no t yet approved or cleared by the [place] FDA and  has been authorized for detection and/or diagnosis of SARS-CoV-2 by FDA under an Emergency Use Authorization (EUA). This EUA will remain  in effect (meaning this test can be used) for the duration of the COVID-19 declaration under Section 564(b)(1) of the Act, 21 U.S.C.section 360bbb-3(b)(1), unless the authorization is terminated  or revoked sooner.       Influenza A by PCR NEGATIVE NEGATIVE Final   Influenza B by PCR NEGATIVE NEGATIVE Final    Comment: (NOTE) The Xpert Xpress SARS-CoV-2/FLU/RSV plus assay is intended as an aid in the diagnosis of influenza from Nasopharyngeal swab specimens and should not be used as a sole basis for treatment. Nasal washings and aspirates are unacceptable for Xpert Xpress SARS-CoV-2/FLU/RSV testing.  Fact Sheet for Patients: EntrepreneurPulse.com.au  Fact Sheet for Healthcare Providers: IncredibleEmployment.be  This test is not yet approved or cleared by the [place] FDA and has been authorized for detection and/or diagnosis of SARS-CoV-2 by FDA under an Emergency Use Authorization (EUA). This EUA will remain in effect (meaning this test can be used) for the duration of the COVID-19 declaration under Section 564(b)(1) of the Act, 21 U.S.C. section 360bbb-3(b)(1), unless the authorization is  terminated or revoked.  Performed at [facility], [place] [address], [place], [place] [address]   Urine Culture     Status: Abnormal   Collection Time: 06/04/21  8:03 AM   Specimen: Urine, Catheterized  Result Value Ref Range Status   Specimen Description   Final    URINE, CATHETERIZED Performed at [place] [address], [place], [place] [address]    Special Requests   Final    NONE Performed at [facility], [place] [address], [place], [place] [address]    Culture (A)  Final    >=100,000 COLONIES/mL ESCHERICHIA COLI Confirmed Extended Spectrum Beta-Lactamase Producer (ESBL).  In bloodstream infections from ESBL organisms, carbapenems are preferred over piperacillin/tazobactam. They are shown to have a lower risk of mortality.    Report Status 06/06/2021 FINAL  Final   Organism ID, Bacteria ESCHERICHIA COLI (A)  Final      Susceptibility   Escherichia coli - MIC*    AMPICILLIN >=32 RESISTANT Resistant     CEFAZOLIN >=64 RESISTANT Resistant     CEFEPIME 2 SENSITIVE Sensitive     CEFTRIAXONE >=64 RESISTANT Resistant     CIPROFLOXACIN >=4 RESISTANT Resistant     GENTAMICIN <=1 SENSITIVE Sensitive     IMIPENEM <=0.25 SENSITIVE Sensitive     NITROFURANTOIN <=16 SENSITIVE Sensitive     TRIMETH/SULFA >=320 RESISTANT Resistant  AMPICILLIN/SULBACTAM >=32 RESISTANT Resistant     PIP/TAZO <=4 SENSITIVE Sensitive     * >=100,000 COLONIES/mL ESCHERICHIA COLI  Resp Panel by RT-PCR (Flu A&B, Covid) Nasopharyngeal Swab     Status: None   Collection Time: 06/06/21  9:35 AM   Specimen: Nasopharyngeal Swab; Nasopharyngeal(NP) swabs in vial transport medium  Result Value Ref Range Status   SARS Coronavirus 2 by RT PCR NEGATIVE NEGATIVE Final    Comment: (NOTE) SARS-CoV-2 target nucleic acids are NOT DETECTED.  The SARS-CoV-2 RNA is generally detectable in upper respiratory specimens during the acute phase of infection. The  lowest concentration of SARS-CoV-2 viral copies this assay can detect is 138 copies/mL. A negative result does not preclude SARS-Cov-2 infection and should not be used as the sole basis for treatment or other patient management decisions. A negative result may occur with  improper specimen collection/handling, submission of specimen other than nasopharyngeal swab, presence of viral mutation(s) within the areas targeted by this assay, and inadequate number of viral copies(<138 copies/mL). A negative result must be combined with clinical observations, patient history, and epidemiological information. The expected result is Negative.  Fact Sheet for Patients:  EntrepreneurPulse.com.au  Fact Sheet for Healthcare Providers:  IncredibleEmployment.be  This test is no t yet approved or cleared by the [place] FDA and  has been authorized for detection and/or diagnosis of SARS-CoV-2 by FDA under an Emergency Use Authorization (EUA). This EUA will remain  in effect (meaning this test can be used) for the duration of the COVID-19 declaration under Section 564(b)(1) of the Act, 21 U.S.C.section 360bbb-3(b)(1), unless the authorization is terminated  or revoked sooner.       Influenza A by PCR NEGATIVE NEGATIVE Final   Influenza B by PCR NEGATIVE NEGATIVE Final    Comment: (NOTE) The Xpert Xpress SARS-CoV-2/FLU/RSV plus assay is intended as an aid in the diagnosis of influenza from Nasopharyngeal swab specimens and should not be used as a sole basis for treatment. Nasal washings and aspirates are unacceptable for Xpert Xpress SARS-CoV-2/FLU/RSV testing.  Fact Sheet for Patients: EntrepreneurPulse.com.au  Fact Sheet for Healthcare Providers: IncredibleEmployment.be  This test is not yet approved or cleared by the [place] FDA and has been authorized for detection and/or diagnosis of SARS-CoV-2 by FDA under  an Emergency Use Authorization (EUA). This EUA will remain in effect (meaning this test can be used) for the duration of the COVID-19 declaration under Section 564(b)(1) of the Act, 21 U.S.C. section 360bbb-3(b)(1), unless the authorization is terminated or revoked.  Performed at [facility], [place] [address] [place], [place] [address]    Time spent: 30 min  SIGNED:   Marylu Lund, MD  [facility] 06/06/2021, 12:58 PM  If 7PM-7AM, please contact night-coverage

## 2021-06-06 NOTE — TOC Transition Note (Addendum)
Transition of Care [facility]) - CM/SW Discharge Note   Patient Details  Name: Anna Hoffman MRN: [id number] Date of Birth: [birth year]  Transition of [facility] CM/SW Contact:  Dessa Phi, RN Phone Number: 06/06/2021, 9:24 AM   Clinical Narrative:  for d/c today auth good through 11/3 today-[employer] rep Juliann Pulse aware-awaiting rapid covid,d/c summary-MD notified. [facility] for transport. 1:26p-covid results back neg.going to rm#[id number] call report tel#[phone]. [facility] to be called once nurse ready.No further CM needs.    Final next level of care: Skilled Nursing Facility Barriers to Discharge: No Barriers Identified   Patient Goals and CMS Choice Patient states their goals for this hospitalization and ongoing recovery are:: Per James/Steven sons may need ST SNF CMS Medicare.gov Compare Post Acute Care list provided to:: Patient Represenative (must comment) Choice offered to / list presented to : Adult Children  Discharge Placement PASRR number recieved: 06/03/21            Patient chooses bed at: [facility] Patient to be transferred to facility by: [facility] Name of family member notified: [name] spon [phone] Patient and family notified of of transfer: 06/06/21  Discharge Plan and Services   Discharge Planning Services: CM Consult Post Acute Care Choice: [place]                               Social Determinants of Health (SDOH) Interventions     Readmission Risk Interventions No flowsheet data found.

## 2021-06-06 NOTE — Progress Notes (Signed)
Pt to be discharged to [facility] this afternoon. Report called to this facility and Claudette Laws LPN accepting report for this facility. VSS and no acute changes noted with Pt's assessment at time of discharge

## 2021-06-07 ENCOUNTER — Telehealth: Payer: Medicare Other

## 2021-06-07 ENCOUNTER — Telehealth: Payer: Self-pay

## 2021-06-07 LAB — GLUCOSE, CAPILLARY
Glucose-Capillary: 131 mg/dL — ABNORMAL HIGH (ref 70–99)
Glucose-Capillary: 105 mg/dL — ABNORMAL HIGH (ref 70–99)

## 2021-06-07 NOTE — Telephone Encounter (Signed)
  Care Management   Follow Up Note   06/07/2021 Name: Anna Hoffman MRN: [id number] DOB: Jun 12, 1935   Referred by: Glendale Chard, MD Reason for referral : Chronic Care Management (Unsuccessful call)   An unsuccessful telephone outreach was attempted today. The patient was referred to the case management team for assistance with care management and care coordination.   Follow Up Plan: The care management team will reach out to the patient again over the next 10 days.   Daneen Schick, BSW, CDP Social Worker, Certified Dementia Practitioner [place] [facility] [phone]

## 2021-06-10 DIAGNOSIS — E7849 Other hyperlipidemia: Secondary | ICD-10-CM | POA: Diagnosis not present

## 2021-06-10 DIAGNOSIS — Z794 Long term (current) use of insulin: Secondary | ICD-10-CM | POA: Diagnosis not present

## 2021-06-10 NOTE — Telephone Encounter (Signed)
This encounter was created in error - please disregard.

## 2021-06-11 ENCOUNTER — Telehealth: Payer: Medicare Other

## 2021-06-11 ENCOUNTER — Telehealth: Payer: Self-pay

## 2021-06-11 DIAGNOSIS — M6281 Muscle weakness (generalized): Secondary | ICD-10-CM | POA: Diagnosis not present

## 2021-06-11 DIAGNOSIS — R5381 Other malaise: Secondary | ICD-10-CM | POA: Diagnosis not present

## 2021-06-11 DIAGNOSIS — M25511 Pain in right shoulder: Secondary | ICD-10-CM | POA: Diagnosis not present

## 2021-06-11 DIAGNOSIS — R262 Difficulty in walking, not elsewhere classified: Secondary | ICD-10-CM | POA: Diagnosis not present

## 2021-06-11 NOTE — Telephone Encounter (Signed)
  Care Management   Follow Up Note   06/11/2021 Name: Anna Hoffman MRN: [id number] DOB: 1935-07-29   Referred by: Glendale Chard, MD Reason for referral : Chronic Care Management (Unsuccessful call)   An unsuccessful telephone outreach was attempted today. The patient was referred to the case management team for assistance with care management and care coordination. SW left the patients son a voice message requesting a return call.  Follow Up Plan: The care management team will reach out to the patient again over the next 10 days.   Daneen Schick, BSW, CDP Social Worker, Certified Dementia Practitioner [place] [facility] [phone]

## 2021-06-13 DIAGNOSIS — I421 Obstructive hypertrophic cardiomyopathy: Secondary | ICD-10-CM | POA: Diagnosis not present

## 2021-06-13 DIAGNOSIS — E785 Hyperlipidemia, unspecified: Secondary | ICD-10-CM | POA: Diagnosis not present

## 2021-06-13 DIAGNOSIS — I10 Essential (primary) hypertension: Secondary | ICD-10-CM | POA: Diagnosis not present

## 2021-06-13 DIAGNOSIS — E1122 Type 2 diabetes mellitus with diabetic chronic kidney disease: Secondary | ICD-10-CM | POA: Diagnosis not present

## 2021-06-14 ENCOUNTER — Ambulatory Visit (INDEPENDENT_AMBULATORY_CARE_PROVIDER_SITE_OTHER): Payer: Medicare Other

## 2021-06-14 DIAGNOSIS — I10 Essential (primary) hypertension: Secondary | ICD-10-CM

## 2021-06-14 DIAGNOSIS — E1122 Type 2 diabetes mellitus with diabetic chronic kidney disease: Secondary | ICD-10-CM

## 2021-06-14 NOTE — Chronic Care Management (AMB) (Addendum)
Chronic Care Management    Social Work Note  06/14/2021 Name: Anna Hoffman MRN: [id number] DOB: August 15, 1934  Anna Hoffman is a 86 y.o. year old female who is a primary care patient of Glendale Chard, MD. The CCM team was consulted to assist the patient with chronic disease management and/or care coordination needs related to:  HTN, Colitis, DM II .   Engaged with patients son [name]  for follow up visit in response to provider referral for social work chronic care management and care coordination services.   Consent to Services:  The patient was given information about Chronic Care Management services, agreed to services, and gave verbal consent prior to initiation of services.  Please see initial visit note for detailed documentation.   Patient agreed to services and consent obtained.   Assessment: Review of patient past medical history, allergies, medications, and health status, including review of relevant consultants reports was performed today as part of a comprehensive evaluation and provision of chronic care management and care coordination services.     SDOH (Social Determinants of Health) assessments and interventions performed:    Advanced Directives Status: Not addressed in this encounter.  CCM Care Plan  Allergies  Allergen Reactions   Amoxicillin Diarrhea    Has patient had a PCN reaction causing immediate rash, facial/tongue/throat swelling, SOB or lightheadedness with hypotension: no Has patient had a PCN reaction causing severe rash involving mucus membranes or skin necrosis: no Has patient had a PCN reaction that required hospitalization: no pharmacist consult Has patient had a PCN reaction occurring within the last 10 years: yes If all of the above answers are "NO", then may proceed with Cephalosporin use.    Ampicillin Rash    - Tolerates Rocephin and Ancef - Remote occurrence; no symptoms of anaphylaxis or severe cutaneous reaction, and no additional medical  attention required     Sulfa Antibiotics Rash    Outpatient Encounter Medications as of 06/14/2021  Medication Sig   acetaminophen (TYLENOL) 325 MG tablet Take 2 tablets (650 mg total) by mouth every 6 (six) hours as needed for mild pain (or Fever >/= 101).   amLODipine (NORVASC) 10 MG tablet Take 1 tablet (10 mg total) by mouth daily.   Ascorbic Acid (VITAMIN C) 1000 MG tablet Take 1,000 mg by mouth daily.   azelastine (ASTELIN) 0.1 % nasal spray Place 2 sprays into both nostrils daily as needed for rhinitis.   Carboxymethylcellul-Glycerin (LUBRICATING EYE DROPS OP) Place 1 drop into both eyes daily as needed (dry eyes).   Cyanocobalamin (VITAMIN B12) 500 MCG TABS Take 500 mcg by mouth daily.   fexofenadine (ALLEGRA ALLERGY) 180 MG tablet Take 1 tablet (180 mg total) by mouth daily.   glucose blood test strip Use as directed to check blood sugars 1 time per day.   glucose monitoring kit (FREESTYLE) monitoring kit Use as directed to check blood sugars 1 time per day dx: e11.22   hydrALAZINE (APRESOLINE) 100 MG tablet Take 1 tablet (100 mg total) by mouth every 8 (eight) hours.   insulin aspart (NOVOLOG) 100 UNIT/ML injection Inject 0-9 Units into the skin 3 (three) times daily with meals.   Lancets (ONETOUCH DELICA PLUS [id number]) MISC Use to check blood sugar once daily   metoprolol tartrate (LOPRESSOR) 25 MG tablet Take 0.5 tablets (12.5 mg total) by mouth 2 (two) times daily.   ondansetron (ZOFRAN) 4 MG/2ML SOLN injection Inject 2 mLs (4 mg total) into the vein every 6 (six) hours as  needed for nausea or vomiting.   pantoprazole (PROTONIX) 40 MG tablet Give 1 tablet by mouth 2 times a day related to gastro-esophageal reflux disease without esophagitis   prochlorperazine (COMPAZINE) 5 MG tablet Take 1 tablet (5 mg total) by mouth every 6 (six) hours as needed for nausea or vomiting.   rosuvastatin (CRESTOR) 20 MG tablet Take 1 tablet (20 mg total) by mouth daily.   trolamine salicylate  (ASPERCREME) 10 % cream Apply 1 application topically daily as needed for muscle pain.   Facility-Administered Encounter Medications as of 06/14/2021  Medication   cyanocobalamin ((VITAMIN B-12)) injection 1,000 mcg    Patient Active Problem List   Diagnosis Date Noted   Acute metabolic encephalopathy    Malnutrition of moderate degree 05/24/2021   Sepsis ([place]) 05/23/2021   SIRS (systemic inflammatory response syndrome) ([place]) 05/22/2021   History of CVA (cerebrovascular accident) 05/22/2021   Gait instability 04/24/2021   Seasonal allergic rhinitis due to pollen 04/24/2021   Ascending aorta dilatation ([place]) 04/05/2021   HOCM (hypertrophic obstructive cardiomyopathy) ([place]) 04/04/2021   Physical deconditioning 04/04/2021   Syncope 04/03/2021   Fall at home, initial encounter 03/31/2021   Thyroid nodule 03/31/2021   CAP (community acquired pneumonia) 11/15/2020   Dehydration 11/15/2020   Erosive esophagitis    Hiatal hernia    Hyponatremia 10/10/2020   Acute kidney injury superimposed on chronic kidney disease ([place]) 10/10/2020   Aortic atherosclerosis ([place]) 10/09/2020   Acute renal failure superimposed on stage 3a chronic kidney disease ([place]) 10/09/2020   Hyperkalemia 10/09/2020   Hearing loss 10/09/2020   Prolonged QT interval 10/09/2020   Colitis, acute 08/11/2019   Colitis 07/11/2019   Loose stools 06/07/2019   Herpes zoster without complication 82/80/0349   Other long term (current) drug therapy 08/12/2018   Chronic kidney disease, stage 3a ([place]) 07/19/2018   Hypertensive nephropathy 07/19/2018   Community acquired pneumonia of right upper lobe of lung 11/07/2017   Hypertensive emergency 07/01/2016   Thalamic hemorrhage ([place]) 07/01/2016   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[place])    Benign essential HTN    Type 2 diabetes mellitus with stage 3 chronic kidney disease, without long-term current use of insulin ([place])    Mixed hyperlipidemia    ICH (intracerebral hemorrhage)  ([place]) - hypertensive R thalamic hemorrhage  06/27/2016   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   Retroperitoneal bleed 02/08/2016   Generalized abdominal pain    Gastroenteritis 02/04/2016   Diarrhea 02/04/2016   Hypokalemia 02/04/2016   Incarcerated paraesophageal hernia 09/02/2014   Renal mass, left 09/02/2014   Acute esophagitis 09/02/2014   Gastric outlet obstruction 09/02/2014   Hypertension    Vomiting 09/01/2014    Conditions to be addressed/monitored: HTN and DMII; Level of care concerns  Care Plan : Social Work Care Plan  Updates made by Daneen Schick since 06/14/2021 12:00 AM     Problem: Mobility and Independence      Goal: Mobility and Independence Optimized   Start Date: 04/24/2021  Expected End Date: 06/08/2021  Priority: High  Note:   Current Barriers:  Chronic disease management support and education needs related to HTN and DM  ADL IADL limitations and Limited access to caregiver  Social Worker Clinical Goal(s):  patient will work with SW to identify and address any acute and/or chronic care coordination needs related to the self health management of HTN and DM  patient will work with SW to address concerns related to level of care needs  SW Interventions:  Inter-disciplinary  care team collaboration (see longitudinal plan of care) Collaboration with Glendale Chard, MD regarding development and update of comprehensive plan of care as evidenced by provider attestation and co-signature Performed chart review to note patient discharged to [place] call placed to Eugenie Filler with [facility] - voice message left requesting a return call  Call completed with patients son [name] who indicates patient remains in rehab and is participating as she is able to - patient remains weak Discussed SW has attempted to contact discharge planner to advise patient has a current long term care Medicaid application and to request assistance with  placement once rehab is completed Determined both [name] and his brother have attempted to speak with staff this week while visiting to discuss goal for long term care without success [employer] to request to speak with a Education officer, museum or discharge planner when he visits next week to review goal for patient to be placed in long term care Update 3:30 pm: Inbound call received from Eugenie Filler with [facility] Discussed desire for long-term care as patient is not safe in the home Mrs. Pearletha Furl reports she will plan to follow up with the patients son on Monday to discuss long term care needs  Patient Goals/Self-Care Activities patient will: with the help of her son  -  Work with SW to address desire for placement  Follow Up Plan:  SW will follow up with [facility] on Tuesday 11/15 if no return call is received       Follow Up Plan:  SW will follow up with [facility] regarding discharge planning over the next 5 days.      Daneen Schick, BSW, CDP Social Worker, Certified Dementia Practitioner [place] [facility] [place] [facility] [phone]

## 2021-06-14 NOTE — Patient Instructions (Signed)
Social Worker Visit Information  Goals we discussed today:   Goals Addressed             This Visit's Progress    Mobility and Independence Optimized       Timeframe:  Short-Term Goal Priority:  High Start Date:  9.21.22                           Expected End Date: 11.5.22                        Patient Goals/Self-Care Activities patient will: with the help of her son  - Work with SW to address desire for placement        Patient verbalizes understanding of instructions provided today and agrees to view in [place].   Follow Up Plan:  SW will contact patient and her son after speaking with [place], [place], CDP Social Worker, Certified Dementia Practitioner [place] [facility] [place] [facility] [phone]

## 2021-06-18 ENCOUNTER — Ambulatory Visit: Payer: Medicare Other

## 2021-06-18 DIAGNOSIS — N1831 Chronic kidney disease, stage 3a: Secondary | ICD-10-CM

## 2021-06-18 DIAGNOSIS — E1122 Type 2 diabetes mellitus with diabetic chronic kidney disease: Secondary | ICD-10-CM

## 2021-06-18 DIAGNOSIS — I10 Essential (primary) hypertension: Secondary | ICD-10-CM

## 2021-06-18 NOTE — Patient Instructions (Signed)
Social Worker Visit Information  Goals we discussed today:   Goals Addressed             This Visit's Progress    Mobility and Independence Optimized       Timeframe:  Short-Term Goal Priority:  High Start Date:  9.21.22                           Expected End Date: 11.5.22                       SW will follow up with patients provider upon discharge from rehab to complete FL2  Patient Goals/Self-Care Activities patient will: with the help of her son  - Work with SW to address desire for placement         Patient verbalizes understanding of instructions provided today and agrees to view in [place].   Follow Up Plan: SW will continue to follow and complete FL2 upon discharge from rehab   Daneen Schick, [place], CDP Social Worker, Certified Dementia Practitioner [place] [facility] [place] [facility] [phone]

## 2021-06-18 NOTE — Chronic Care Management (AMB) (Signed)
Chronic Care Management    Social Work Note  06/18/2021 Name: Anna Hoffman MRN: [id number] DOB: [birth year]  Anna Hoffman is a 86 y.o. year old female who is a primary care patient of Glendale Chard, MD. The CCM team was consulted to assist the patient with chronic disease management and/or care coordination needs related to:  HTN, Colitis, DM II .   Engaged with patients son by phone  for follow up visit in response to provider referral for social work chronic care management and care coordination services.   Consent to Services:  The patient was given information about Chronic Care Management services, agreed to services, and gave verbal consent prior to initiation of services.  Please see initial visit note for detailed documentation.   Patient agreed to services and consent obtained.   Assessment: Review of patient past medical history, allergies, medications, and health status, including review of relevant consultants reports was performed today as part of a comprehensive evaluation and provision of chronic care management and care coordination services.     SDOH (Social Determinants of Health) assessments and interventions performed:    Advanced Directives Status: Not addressed in this encounter.  CCM Care Plan  Allergies  Allergen Reactions   Amoxicillin Diarrhea    Has patient had a PCN reaction causing immediate rash, facial/tongue/throat swelling, SOB or lightheadedness with hypotension: no Has patient had a PCN reaction causing severe rash involving mucus membranes or skin necrosis: no Has patient had a PCN reaction that required hospitalization: no pharmacist consult Has patient had a PCN reaction occurring within the last 10 years: yes If all of the above answers are "NO", then may proceed with Cephalosporin use.    Ampicillin Rash    - Tolerates Rocephin and Ancef - Remote occurrence; no symptoms of anaphylaxis or severe cutaneous reaction, and no additional  medical attention required     Sulfa Antibiotics Rash    Outpatient Encounter Medications as of 06/18/2021  Medication Sig   acetaminophen (TYLENOL) 325 MG tablet Take 2 tablets (650 mg total) by mouth every 6 (six) hours as needed for mild pain (or Fever >/= 101).   amLODipine (NORVASC) 10 MG tablet Take 1 tablet (10 mg total) by mouth daily.   Ascorbic Acid (VITAMIN C) 1000 MG tablet Take 1,000 mg by mouth daily.   azelastine (ASTELIN) 0.1 % nasal spray Place 2 sprays into both nostrils daily as needed for rhinitis.   Carboxymethylcellul-Glycerin (LUBRICATING EYE DROPS OP) Place 1 drop into both eyes daily as needed (dry eyes).   Cyanocobalamin (VITAMIN B12) 500 MCG TABS Take 500 mcg by mouth daily.   fexofenadine (ALLEGRA ALLERGY) 180 MG tablet Take 1 tablet (180 mg total) by mouth daily.   glucose blood test strip Use as directed to check blood sugars 1 time per day.   glucose monitoring kit (FREESTYLE) monitoring kit Use as directed to check blood sugars 1 time per day dx: e11.22   hydrALAZINE (APRESOLINE) 100 MG tablet Take 1 tablet (100 mg total) by mouth every 8 (eight) hours.   insulin aspart (NOVOLOG) 100 UNIT/ML injection Inject 0-9 Units into the skin 3 (three) times daily with meals.   Lancets (ONETOUCH DELICA PLUS [id number]) MISC Use to check blood sugar once daily   metoprolol tartrate (LOPRESSOR) 25 MG tablet Take 0.5 tablets (12.5 mg total) by mouth 2 (two) times daily.   ondansetron (ZOFRAN) 4 MG/2ML SOLN injection Inject 2 mLs (4 mg total) into the vein every 6 (six) hours  as needed for nausea or vomiting.   pantoprazole (PROTONIX) 40 MG tablet Give 1 tablet by mouth 2 times a day related to gastro-esophageal reflux disease without esophagitis   prochlorperazine (COMPAZINE) 5 MG tablet Take 1 tablet (5 mg total) by mouth every 6 (six) hours as needed for nausea or vomiting.   rosuvastatin (CRESTOR) 20 MG tablet Take 1 tablet (20 mg total) by mouth daily.   trolamine  salicylate (ASPERCREME) 10 % cream Apply 1 application topically daily as needed for muscle pain.   Facility-Administered Encounter Medications as of 06/18/2021  Medication   cyanocobalamin ((VITAMIN B-12)) injection 1,000 mcg    Patient Active Problem List   Diagnosis Date Noted   Acute metabolic encephalopathy    Malnutrition of moderate degree 05/24/2021   Sepsis ([place]) 05/23/2021   SIRS (systemic inflammatory response syndrome) ([place]) 05/22/2021   History of CVA (cerebrovascular accident) 05/22/2021   Gait instability 04/24/2021   Seasonal allergic rhinitis due to pollen 04/24/2021   Ascending aorta dilatation ([place]) 04/05/2021   HOCM (hypertrophic obstructive cardiomyopathy) ([place]) 04/04/2021   Physical deconditioning 04/04/2021   Syncope 04/03/2021   Fall at home, initial encounter 03/31/2021   Thyroid nodule 03/31/2021   CAP (community acquired pneumonia) 11/15/2020   Dehydration 11/15/2020   Erosive esophagitis    Hiatal hernia    Hyponatremia 10/10/2020   Acute kidney injury superimposed on chronic kidney disease ([place]) 10/10/2020   Aortic atherosclerosis ([place]) 10/09/2020   Acute renal failure superimposed on stage 3a chronic kidney disease ([place]) 10/09/2020   Hyperkalemia 10/09/2020   Hearing loss 10/09/2020   Prolonged QT interval 10/09/2020   Colitis, acute 08/11/2019   Colitis 07/11/2019   Loose stools 06/07/2019   Herpes zoster without complication 63/08/6008   Other long term (current) drug therapy 08/12/2018   Chronic kidney disease, stage 3a ([place]) 07/19/2018   Hypertensive nephropathy 07/19/2018   Community acquired pneumonia of right upper lobe of lung 11/07/2017   Hypertensive emergency 07/01/2016   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[place])    Benign essential HTN    Type 2 diabetes mellitus with stage 3 chronic kidney disease, without long-term current use of insulin ([place])    Mixed hyperlipidemia    ICH (intracerebral  hemorrhage) ([place]) - hypertensive R thalamic hemorrhage  06/27/2016   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   Retroperitoneal bleed 02/08/2016   Generalized abdominal pain    Gastroenteritis 02/04/2016   Diarrhea 02/04/2016   Hypokalemia 02/04/2016   Incarcerated paraesophageal hernia 09/02/2014   Renal mass, left 09/02/2014   Acute esophagitis 09/02/2014   Gastric outlet obstruction 09/02/2014   Hypertension    Vomiting 09/01/2014    Conditions to be addressed/monitored: HTN, DMII, and Colitis ; Level of care concerns  Care Plan : Social Work Care Plan  Updates made by Daneen Schick since 06/18/2021 12:00 AM     Problem: Mobility and Independence      Goal: Mobility and Independence Optimized   Start Date: 04/24/2021  Expected End Date: 06/08/2021  Priority: High  Note:   Current Barriers:  Chronic disease management support and education needs related to HTN and DM  ADL IADL limitations and Limited access to caregiver  Social Worker Clinical Goal(s):  patient will work with SW to identify and address any acute and/or chronic care coordination needs related to the self health management of HTN and DM  patient will work with SW to address concerns related to level of care needs  SW  Interventions:  Inter-disciplinary care team collaboration (see longitudinal plan of care) Collaboration with Glendale Chard, MD regarding development and update of comprehensive plan of care as evidenced by provider attestation and co-signature Telephonic visit completed with patients son Allison Quarry has been in contact with Mrs. Bobette Mo from [facility] who reports patient discharge date will be 11/22 Discussed plans for this SW to assist with long term care placement upon patient returning home Reviewed process for placement advising SW will initiate an FL2 for patients primary care provider Dr. Baird Cancer to complete Requested [name] obtain an updated medication list from  [facility] upon discharge and provide to patients primary care provider   Patient Goals/Self-Care Activities patient will: with the help of her son  -  Work with SW to address desire for placement  Follow Up Plan:  SW will initiate an FL2 for patients provider to complete upon discharge from rehab       Follow Up Plan:  SW will continue to follow.      Daneen Schick, BSW, CDP Social Worker, Certified Dementia Practitioner [place] [facility] [place] [facility] [phone]

## 2021-06-19 DIAGNOSIS — M6281 Muscle weakness (generalized): Secondary | ICD-10-CM | POA: Diagnosis not present

## 2021-06-19 DIAGNOSIS — M25511 Pain in right shoulder: Secondary | ICD-10-CM | POA: Diagnosis not present

## 2021-06-19 DIAGNOSIS — R5381 Other malaise: Secondary | ICD-10-CM | POA: Diagnosis not present

## 2021-06-19 DIAGNOSIS — R262 Difficulty in walking, not elsewhere classified: Secondary | ICD-10-CM | POA: Diagnosis not present

## 2021-06-21 DIAGNOSIS — N1832 Chronic kidney disease, stage 3b: Secondary | ICD-10-CM | POA: Diagnosis not present

## 2021-06-21 DIAGNOSIS — R2689 Other abnormalities of gait and mobility: Secondary | ICD-10-CM | POA: Diagnosis not present

## 2021-06-21 DIAGNOSIS — I10 Essential (primary) hypertension: Secondary | ICD-10-CM | POA: Diagnosis not present

## 2021-06-21 DIAGNOSIS — M6281 Muscle weakness (generalized): Secondary | ICD-10-CM | POA: Diagnosis not present

## 2021-06-25 ENCOUNTER — Inpatient Hospital Stay: Payer: Medicare Other | Admitting: Internal Medicine

## 2021-06-26 ENCOUNTER — Encounter: Payer: Medicare Other | Admitting: Internal Medicine

## 2021-06-26 ENCOUNTER — Ambulatory Visit: Payer: Medicare Other

## 2021-06-26 ENCOUNTER — Inpatient Hospital Stay: Payer: Medicare Other | Admitting: Internal Medicine

## 2021-06-26 DIAGNOSIS — I10 Essential (primary) hypertension: Secondary | ICD-10-CM

## 2021-06-26 DIAGNOSIS — N1831 Chronic kidney disease, stage 3a: Secondary | ICD-10-CM

## 2021-06-26 NOTE — Patient Instructions (Signed)
Social Worker Visit Information  Goals we discussed today:   Goals Addressed             This Visit's Progress    Mobility and Independence Optimized       Timeframe:  Short-Term Goal Priority:  High Start Date:  9.21.22                           Expected End Date: 11.5.22                       SW will follow up with patients provider upon discharge from rehab to complete FL2.  Patient Goals/Self-Care Activities patient will: with the help of her son  - Work with SW to address desire for placement         Patient verbalizes understanding of instructions provided today and agrees to view in [place].   Follow Up Plan: SW will follow up with patient by phone over the next 5 days   Daneen Schick, BSW, CDP Social Worker, Certified Dementia Practitioner [place] [facility] [place] [facility] [phone]

## 2021-06-26 NOTE — Chronic Care Management (AMB) (Signed)
Chronic Care Management    Social Work Note  06/26/2021 Name: Anna Hoffman MRN: [id number] DOB: Sep 12, 1934  Anna Hoffman is a 86 y.o. year old female who is a primary care patient of Glendale Chard, MD. The CCM team was consulted to assist the patient with chronic disease management and/or care coordination needs related to:  HTN, Colitis, DM II .   Engaged with patients son [name] by phone  for follow up visit in response to provider referral for social work chronic care management and care coordination services.   Consent to Services:  The patient was given information about Chronic Care Management services, agreed to services, and gave verbal consent prior to initiation of services.  Please see initial visit note for detailed documentation.   Patient agreed to services and consent obtained.   Assessment: Review of patient past medical history, allergies, medications, and health status, including review of relevant consultants reports was performed today as part of a comprehensive evaluation and provision of chronic care management and care coordination services.     SDOH (Social Determinants of Health) assessments and interventions performed:    Advanced Directives Status: Not addressed in this encounter.  CCM Care Plan  Allergies  Allergen Reactions   Amoxicillin Diarrhea    Has patient had a PCN reaction causing immediate rash, facial/tongue/throat swelling, SOB or lightheadedness with hypotension: no Has patient had a PCN reaction causing severe rash involving mucus membranes or skin necrosis: no Has patient had a PCN reaction that required hospitalization: no pharmacist consult Has patient had a PCN reaction occurring within the last 10 years: yes If all of the above answers are "NO", then may proceed with Cephalosporin use.    Ampicillin Rash    - Tolerates Rocephin and Ancef - Remote occurrence; no symptoms of anaphylaxis or severe cutaneous reaction, and no additional  medical attention required     Sulfa Antibiotics Rash    Outpatient Encounter Medications as of 06/26/2021  Medication Sig   acetaminophen (TYLENOL) 325 MG tablet Take 2 tablets (650 mg total) by mouth every 6 (six) hours as needed for mild pain (or Fever >/= 101).   amLODipine (NORVASC) 10 MG tablet Take 1 tablet (10 mg total) by mouth daily.   Ascorbic Acid (VITAMIN C) 1000 MG tablet Take 1,000 mg by mouth daily.   azelastine (ASTELIN) 0.1 % nasal spray Place 2 sprays into both nostrils daily as needed for rhinitis.   Carboxymethylcellul-Glycerin (LUBRICATING EYE DROPS OP) Place 1 drop into both eyes daily as needed (dry eyes).   Cyanocobalamin (VITAMIN B12) 500 MCG TABS Take 500 mcg by mouth daily.   fexofenadine (ALLEGRA ALLERGY) 180 MG tablet Take 1 tablet (180 mg total) by mouth daily.   glucose blood test strip Use as directed to check blood sugars 1 time per day.   glucose monitoring kit (FREESTYLE) monitoring kit Use as directed to check blood sugars 1 time per day dx: e11.22   hydrALAZINE (APRESOLINE) 100 MG tablet Take 1 tablet (100 mg total) by mouth every 8 (eight) hours.   insulin aspart (NOVOLOG) 100 UNIT/ML injection Inject 0-9 Units into the skin 3 (three) times daily with meals.   Lancets (ONETOUCH DELICA PLUS [id number]) MISC Use to check blood sugar once daily   metoprolol tartrate (LOPRESSOR) 25 MG tablet Take 0.5 tablets (12.5 mg total) by mouth 2 (two) times daily.   ondansetron (ZOFRAN) 4 MG/2ML SOLN injection Inject 2 mLs (4 mg total) into the vein every 6 (six)  hours as needed for nausea or vomiting.   pantoprazole (PROTONIX) 40 MG tablet Give 1 tablet by mouth 2 times a day related to gastro-esophageal reflux disease without esophagitis   prochlorperazine (COMPAZINE) 5 MG tablet Take 1 tablet (5 mg total) by mouth every 6 (six) hours as needed for nausea or vomiting.   rosuvastatin (CRESTOR) 20 MG tablet Take 1 tablet (20 mg total) by mouth daily.   trolamine  salicylate (ASPERCREME) 10 % cream Apply 1 application topically daily as needed for muscle pain.   Facility-Administered Encounter Medications as of 06/26/2021  Medication   cyanocobalamin ((VITAMIN B-12)) injection 1,000 mcg    Patient Active Problem List   Diagnosis Date Noted   Acute metabolic encephalopathy    Malnutrition of moderate degree 05/24/2021   Sepsis ([place]) 05/23/2021   SIRS (systemic inflammatory response syndrome) ([place]) 05/22/2021   History of CVA (cerebrovascular accident) 05/22/2021   Gait instability 04/24/2021   Seasonal allergic rhinitis due to pollen 04/24/2021   Ascending aorta dilatation ([place]) 04/05/2021   HOCM (hypertrophic obstructive cardiomyopathy) ([place]) 04/04/2021   Physical deconditioning 04/04/2021   Syncope 04/03/2021   Fall at home, initial encounter 03/31/2021   Thyroid nodule 03/31/2021   CAP (community acquired pneumonia) 11/15/2020   Dehydration 11/15/2020   Erosive esophagitis    Hiatal hernia    Hyponatremia 10/10/2020   Acute kidney injury superimposed on chronic kidney disease ([place]) 10/10/2020   Aortic atherosclerosis ([place]) 10/09/2020   Acute renal failure superimposed on stage 3a chronic kidney disease ([employer]) 10/09/2020   Hyperkalemia 10/09/2020   Hearing loss 10/09/2020   Prolonged QT interval 10/09/2020   Colitis, acute 08/11/2019   Colitis 07/11/2019   Loose stools 06/07/2019   Herpes zoster without complication 41/32/4401   Other long term (current) drug therapy 08/12/2018   Chronic kidney disease, stage 3a ([place]) 07/19/2018   Hypertensive nephropathy 07/19/2018   Community acquired pneumonia of right upper lobe of lung 11/07/2017   Hypertensive emergency 07/01/2016   Thalamic hemorrhage ([place]) 07/01/2016   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[place])    Benign essential HTN    Type 2 diabetes mellitus with stage 3 chronic kidney disease, without long-term current use of insulin ([place])    Mixed hyperlipidemia    ICH (intracerebral  hemorrhage) ([place]) - hypertensive R thalamic hemorrhage  06/27/2016   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   Retroperitoneal bleed 02/08/2016   Generalized abdominal pain    Gastroenteritis 02/04/2016   Diarrhea 02/04/2016   Hypokalemia 02/04/2016   Incarcerated paraesophageal hernia 09/02/2014   Renal mass, left 09/02/2014   Acute esophagitis 09/02/2014   Gastric outlet obstruction 09/02/2014   Hypertension    Vomiting 09/01/2014    Conditions to be addressed/monitored: HTN, DMII, and Colitis ; Level of care concerns  Care Plan : Social Work Care Plan  Updates made by Daneen Schick since 06/26/2021 12:00 AM     Problem: Mobility and Independence      Goal: Mobility and Independence Optimized   Start Date: 04/24/2021  Expected End Date: 06/08/2021  Priority: High  Note:   Current Barriers:  Chronic disease management support and education needs related to HTN and DM  ADL IADL limitations and Limited access to caregiver  Social Worker Clinical Goal(s):  patient will work with SW to identify and address any acute and/or chronic care coordination needs related to the self health management of HTN and DM  patient will work with SW to address concerns related to level of care needs  SW Interventions:  Inter-disciplinary care team collaboration (see longitudinal plan of care) Collaboration with Glendale Chard, MD regarding development and update of comprehensive plan of care as evidenced by provider attestation and co-signature Telephonic visit completed with patients son Allison Quarry has submitted an appeal to patients health plan to allow her to remain in rehab Discussed plan for SW to follow up with [name] on Monday 11.28 to determine outcome of appeal  Patient Goals/Self-Care Activities patient will: with the help of her son  -  Work with SW to address desire for placement  Follow Up Plan:  SW will initiate an FL2 for patients provider to complete upon  discharge from rehab       Follow Up Plan: SW will follow up with patient by phone over the next 5 days      Daneen Schick, BSW, CDP Social Worker, Certified Dementia Practitioner [facility] [phone]

## 2021-07-01 ENCOUNTER — Ambulatory Visit: Payer: Medicare Other

## 2021-07-01 ENCOUNTER — Telehealth: Payer: Medicare Other

## 2021-07-01 DIAGNOSIS — E1122 Type 2 diabetes mellitus with diabetic chronic kidney disease: Secondary | ICD-10-CM

## 2021-07-01 DIAGNOSIS — I10 Essential (primary) hypertension: Secondary | ICD-10-CM

## 2021-07-01 NOTE — Chronic Care Management (AMB) (Signed)
Chronic Care Management    Social Work Note  07/01/2021 Name: Anna Hoffman MRN: [id number] DOB: 1935-03-25  Anna Hoffman is a 86 y.o. year old female who is a primary care patient of Anna Chard, MD. The CCM team was consulted to assist the patient with chronic disease management and/or care coordination needs related to:  HTN, DM II .   Engaged with patients son Anna Hoffman by phone  for follow up visit in response to provider referral for social work chronic care management and care coordination services.   Consent to Services:  The patient was given information about Chronic Care Management services, agreed to services, and gave verbal consent prior to initiation of services.  Please see initial visit note for detailed documentation.   Patient agreed to services and consent obtained.   Assessment: Review of patient past medical history, allergies, medications, and health status, including review of relevant consultants reports was performed today as part of a comprehensive evaluation and provision of chronic care management and care coordination services.     SDOH (Social Determinants of Health) assessments and interventions performed:    Advanced Directives Status: Not addressed in this encounter.  CCM Care Plan  Allergies  Allergen Reactions   Amoxicillin Diarrhea    Has patient had a PCN reaction causing immediate rash, facial/tongue/throat swelling, SOB or lightheadedness with hypotension: no Has patient had a PCN reaction causing severe rash involving mucus membranes or skin necrosis: no Has patient had a PCN reaction that required hospitalization: no pharmacist consult Has patient had a PCN reaction occurring within the last 10 years: yes If all of the above answers are "NO", then may proceed with Cephalosporin use.    Ampicillin Rash    - Tolerates Rocephin and Ancef - Remote occurrence; no symptoms of anaphylaxis or severe cutaneous reaction, and no additional medical  attention required     Sulfa Antibiotics Rash    Outpatient Encounter Medications as of 07/01/2021  Medication Sig   acetaminophen (TYLENOL) 325 MG tablet Take 2 tablets (650 mg total) by mouth every 6 (six) hours as needed for mild pain (or Fever >/= 101).   amLODipine (NORVASC) 10 MG tablet Take 1 tablet (10 mg total) by mouth daily.   Ascorbic Acid (VITAMIN C) 1000 MG tablet Take 1,000 mg by mouth daily.   azelastine (ASTELIN) 0.1 % nasal spray Place 2 sprays into both nostrils daily as needed for rhinitis.   Carboxymethylcellul-Glycerin (LUBRICATING EYE DROPS OP) Place 1 drop into both eyes daily as needed (dry eyes).   Cyanocobalamin (VITAMIN B12) 500 MCG TABS Take 500 mcg by mouth daily.   fexofenadine (ALLEGRA ALLERGY) 180 MG tablet Take 1 tablet (180 mg total) by mouth daily.   glucose blood test strip Use as directed to check blood sugars 1 time per day.   glucose monitoring kit (FREESTYLE) monitoring kit Use as directed to check blood sugars 1 time per day dx: e11.22   hydrALAZINE (APRESOLINE) 100 MG tablet Take 1 tablet (100 mg total) by mouth every 8 (eight) hours.   insulin aspart (NOVOLOG) 100 UNIT/ML injection Inject 0-9 Units into the skin 3 (three) times daily with meals.   Lancets (ONETOUCH DELICA PLUS [id number]) MISC Use to check blood sugar once daily   metoprolol tartrate (LOPRESSOR) 25 MG tablet Take 0.5 tablets (12.5 mg total) by mouth 2 (two) times daily.   ondansetron (ZOFRAN) 4 MG/2ML SOLN injection Inject 2 mLs (4 mg total) into the vein every 6 (six) hours  as needed for nausea or vomiting.   pantoprazole (PROTONIX) 40 MG tablet Give 1 tablet by mouth 2 times a day related to gastro-esophageal reflux disease without esophagitis   prochlorperazine (COMPAZINE) 5 MG tablet Take 1 tablet (5 mg total) by mouth every 6 (six) hours as needed for nausea or vomiting.   rosuvastatin (CRESTOR) 20 MG tablet Take 1 tablet (20 mg total) by mouth daily.   trolamine salicylate  (ASPERCREME) 10 % cream Apply 1 application topically daily as needed for muscle pain.   Facility-Administered Encounter Medications as of 07/01/2021  Medication   cyanocobalamin ((VITAMIN B-12)) injection 1,000 mcg    Patient Active Problem List   Diagnosis Date Noted   Acute metabolic encephalopathy    Malnutrition of moderate degree 05/24/2021   Sepsis ([place]) 05/23/2021   SIRS (systemic inflammatory response syndrome) ([place]) 05/22/2021   History of CVA (cerebrovascular accident) 05/22/2021   Gait instability 04/24/2021   Seasonal allergic rhinitis due to pollen 04/24/2021   Ascending aorta dilatation ([place]) 04/05/2021   HOCM (hypertrophic obstructive cardiomyopathy) ([place]) 04/04/2021   Physical deconditioning 04/04/2021   Syncope 04/03/2021   Fall at home, initial encounter 03/31/2021   Thyroid nodule 03/31/2021   CAP (community acquired pneumonia) 11/15/2020   Dehydration 11/15/2020   Erosive esophagitis    Hiatal hernia    Hyponatremia 10/10/2020   Acute kidney injury superimposed on chronic kidney disease ([place]) 10/10/2020   Aortic atherosclerosis ([place]) 10/09/2020   Acute renal failure superimposed on stage 3a chronic kidney disease ([place]) 10/09/2020   Hyperkalemia 10/09/2020   Hearing loss 10/09/2020   Prolonged QT interval 10/09/2020   Colitis, acute 08/11/2019   Colitis 07/11/2019   Loose stools 06/07/2019   Herpes zoster without complication 67/07/4579   Other long term (current) drug therapy 08/12/2018   Chronic kidney disease, stage 3a ([place]) 07/19/2018   Hypertensive nephropathy 07/19/2018   Community acquired pneumonia of right upper lobe of lung 11/07/2017   Hypertensive emergency 07/01/2016   Thalamic hemorrhage ([place]) 07/01/2016   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[place])    Benign essential HTN    Type 2 diabetes mellitus with stage 3 chronic kidney disease, without long-term current use of insulin ([place])    Mixed hyperlipidemia    ICH (intracerebral hemorrhage)  ([place]) - hypertensive R thalamic hemorrhage  06/27/2016   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   Retroperitoneal bleed 02/08/2016   Generalized abdominal pain    Gastroenteritis 02/04/2016   Diarrhea 02/04/2016   Hypokalemia 02/04/2016   Incarcerated paraesophageal hernia 09/02/2014   Renal mass, left 09/02/2014   Acute esophagitis 09/02/2014   Gastric outlet obstruction 09/02/2014   Hypertension    Vomiting 09/01/2014    Conditions to be addressed/monitored: HTN and DMII; Level of care concerns  Care Plan : Social Work Care Plan  Updates made by Daneen Schick since 07/01/2021 12:00 AM     Problem: Mobility and Independence      Goal: Mobility and Independence Optimized   Start Date: 04/24/2021  Expected End Date: 06/08/2021  Priority: High  Note:   Current Barriers:  Chronic disease management support and education needs related to HTN and DM  ADL IADL limitations and Limited access to caregiver  Social Worker Clinical Goal(s):  patient will work with SW to identify and address any acute and/or chronic care coordination needs related to the self health management of HTN and DM  patient will work with SW to address concerns related to level of care needs  SW Interventions:  Inter-disciplinary care team collaboration (see longitudinal plan of care) Collaboration with Anna Chard, MD regarding development and update of comprehensive plan of care as evidenced by provider attestation and co-signature Telephonic visit completed with patients son Anna Hoffman  Determined most recent appeal was approved and the patient is still in rehab Discussed plans for Anna Hoffman to contact SW once the patient discharges home so SW can assist with long term placement  Patient Goals/Self-Care Activities patient will: with the help of her son  -  Contact SW once discharged from rehab  Follow Up Plan:  SW will initiate an FL2 for patients provider to complete upon discharge from rehab        Follow Up Plan:  SW will assist with placement into long term care upon discharge from rehab      Daneen Schick, [place], CDP Social Worker, Certified Dementia Practitioner [facility] [phone]

## 2021-07-01 NOTE — Chronic Care Management (AMB) (Signed)
Chronic Care Management    Social Work Note  07/01/2021 Name: Anna Hoffman MRN: [id number] DOB: March 22, 1935  Anna Hoffman is a 86 y.o. year old female who is a primary care patient of Glendale Chard, MD. The CCM team was consulted to assist the patient with chronic disease management and/or care coordination needs related to:  HTN DM II .   Collaboration with Dr. Baird Cancer  for  case discussion  in response to provider referral for social work chronic care management and care coordination services.   Consent to Services:  The patient was given information about Chronic Care Management services, agreed to services, and gave verbal consent prior to initiation of services.  Please see initial visit note for detailed documentation.   Patient agreed to services and consent obtained.   Assessment: Review of patient past medical history, allergies, medications, and health status, including review of relevant consultants reports was performed today as part of a comprehensive evaluation and provision of chronic care management and care coordination services.     SDOH (Social Determinants of Health) assessments and interventions performed:    Advanced Directives Status: Not addressed in this encounter.  CCM Care Plan  Allergies  Allergen Reactions   Amoxicillin Diarrhea    Has patient had a PCN reaction causing immediate rash, facial/tongue/throat swelling, SOB or lightheadedness with hypotension: no Has patient had a PCN reaction causing severe rash involving mucus membranes or skin necrosis: no Has patient had a PCN reaction that required hospitalization: no pharmacist consult Has patient had a PCN reaction occurring within the last 10 years: yes If all of the above answers are "NO", then may proceed with Cephalosporin use.    Ampicillin Rash    - Tolerates Rocephin and Ancef - Remote occurrence; no symptoms of anaphylaxis or severe cutaneous reaction, and no additional medical attention  required     Sulfa Antibiotics Rash    Outpatient Encounter Medications as of 07/01/2021  Medication Sig   acetaminophen (TYLENOL) 325 MG tablet Take 2 tablets (650 mg total) by mouth every 6 (six) hours as needed for mild pain (or Fever >/= 101).   amLODipine (NORVASC) 10 MG tablet Take 1 tablet (10 mg total) by mouth daily.   Ascorbic Acid (VITAMIN C) 1000 MG tablet Take 1,000 mg by mouth daily.   azelastine (ASTELIN) 0.1 % nasal spray Place 2 sprays into both nostrils daily as needed for rhinitis.   Carboxymethylcellul-Glycerin (LUBRICATING EYE DROPS OP) Place 1 drop into both eyes daily as needed (dry eyes).   Cyanocobalamin (VITAMIN B12) 500 MCG TABS Take 500 mcg by mouth daily.   fexofenadine (ALLEGRA ALLERGY) 180 MG tablet Take 1 tablet (180 mg total) by mouth daily.   glucose blood test strip Use as directed to check blood sugars 1 time per day.   glucose monitoring kit (FREESTYLE) monitoring kit Use as directed to check blood sugars 1 time per day dx: e11.22   hydrALAZINE (APRESOLINE) 100 MG tablet Take 1 tablet (100 mg total) by mouth every 8 (eight) hours.   insulin aspart (NOVOLOG) 100 UNIT/ML injection Inject 0-9 Units into the skin 3 (three) times daily with meals.   Lancets (ONETOUCH DELICA PLUS [id number]) MISC Use to check blood sugar once daily   metoprolol tartrate (LOPRESSOR) 25 MG tablet Take 0.5 tablets (12.5 mg total) by mouth 2 (two) times daily.   ondansetron (ZOFRAN) 4 MG/2ML SOLN injection Inject 2 mLs (4 mg total) into the vein every 6 (six) hours as needed  for nausea or vomiting.   pantoprazole (PROTONIX) 40 MG tablet Give 1 tablet by mouth 2 times a day related to gastro-esophageal reflux disease without esophagitis   prochlorperazine (COMPAZINE) 5 MG tablet Take 1 tablet (5 mg total) by mouth every 6 (six) hours as needed for nausea or vomiting.   rosuvastatin (CRESTOR) 20 MG tablet Take 1 tablet (20 mg total) by mouth daily.   trolamine salicylate (ASPERCREME)  10 % cream Apply 1 application topically daily as needed for muscle pain.   Facility-Administered Encounter Medications as of 07/01/2021  Medication   cyanocobalamin ((VITAMIN B-12)) injection 1,000 mcg    Patient Active Problem List   Diagnosis Date Noted   Acute metabolic encephalopathy    Malnutrition of moderate degree 05/24/2021   Sepsis ([place]) 05/23/2021   SIRS (systemic inflammatory response syndrome) ([place]) 05/22/2021   History of CVA (cerebrovascular accident) 05/22/2021   Gait instability 04/24/2021   Seasonal allergic rhinitis due to pollen 04/24/2021   Ascending aorta dilatation ([place]) 04/05/2021   HOCM (hypertrophic obstructive cardiomyopathy) ([place]) 04/04/2021   Physical deconditioning 04/04/2021   Syncope 04/03/2021   Fall at home, initial encounter 03/31/2021   Thyroid nodule 03/31/2021   CAP (community acquired pneumonia) 11/15/2020   Dehydration 11/15/2020   Erosive esophagitis    Hiatal hernia    Hyponatremia 10/10/2020   Acute kidney injury superimposed on chronic kidney disease ([place]) 10/10/2020   Aortic atherosclerosis ([place]) 10/09/2020   Acute renal failure superimposed on stage 3a chronic kidney disease ([place]) 10/09/2020   Hyperkalemia 10/09/2020   Hearing loss 10/09/2020   Prolonged QT interval 10/09/2020   Colitis, acute 08/11/2019   Colitis 07/11/2019   Loose stools 06/07/2019   Herpes zoster without complication 09/32/3557   Other long term (current) drug therapy 08/12/2018   Chronic kidney disease, stage 3a ([place]) 07/19/2018   Hypertensive nephropathy 07/19/2018   Community acquired pneumonia of right upper lobe of lung 11/07/2017   Hypertensive emergency 07/01/2016   Thalamic hemorrhage ([place]) 07/01/2016   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[place])    Benign essential HTN    Type 2 diabetes mellitus with stage 3 chronic kidney disease, without long-term current use of insulin ([place])    Mixed hyperlipidemia    ICH (intracerebral hemorrhage) ([place]) -  hypertensive R thalamic hemorrhage  06/27/2016   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   Retroperitoneal bleed 02/08/2016   Generalized abdominal pain    Gastroenteritis 02/04/2016   Diarrhea 02/04/2016   Hypokalemia 02/04/2016   Incarcerated paraesophageal hernia 09/02/2014   Renal mass, left 09/02/2014   Acute esophagitis 09/02/2014   Gastric outlet obstruction 09/02/2014   Hypertension    Vomiting 09/01/2014    Conditions to be addressed/monitored: HTN and DMII; Level of care concerns  Care Plan : Social Work Care Plan  Updates made by Daneen Schick since 07/01/2021 12:00 AM     Problem: Mobility and Independence      Goal: Mobility and Independence Optimized   Start Date: 04/24/2021  Expected End Date: 06/08/2021  Priority: High  Note:   Current Barriers:  Chronic disease management support and education needs related to HTN and DM  ADL IADL limitations and Limited access to caregiver  Social Worker Clinical Goal(s):  patient will work with SW to identify and address any acute and/or chronic care coordination needs related to the self health management of HTN and DM  patient will work with SW to address concerns related to level of care needs  SW Interventions:  Inter-disciplinary care  team collaboration (see longitudinal plan of care) Collaboration with Glendale Chard, MD regarding development and update of comprehensive plan of care as evidenced by provider attestation and co-signature Telephonic visit completed with patients son [name]  Determined most recent appeal was approved and the patient is still in rehab Discussed plans for [name] to contact SW once the patient discharges home so SW can assist with long term placement Received inbound call from patients son advising he received a letter from [place] stating patient already has Medicaid Discussed opportunity to have SW initiate FL2 prior to rehab discharge in hopes of finding placement without patient returning  home Collaboration with Dr. Baird Cancer to discuss plan for SW to initiate Fl2 and send to her for completion   Patient Goals/Self-Care Activities patient will: with the help of her son  -  Contact SW once discharged from rehab  Follow Up Plan:  SW will collaborate with primary care provider regarding FL2 completion       Follow Up Plan: SW will follow up with patient by phone over the next month      Daneen Schick, BSW, CDP Social Worker, Certified Dementia Practitioner [place] [facility] [phone]

## 2021-07-01 NOTE — Patient Instructions (Signed)
Social Worker Visit Information  Goals we discussed today:   Goals Addressed             This Visit's Progress    Mobility and Independence Optimized       Timeframe:  Short-Term Goal Priority:  High Start Date:  9.21.22                           Expected End Date: 11.5.22                       SW will follow up with patients provider upon discharge from rehab to complete FL2.  Patient Goals/Self-Care Activities patient will: with the help of her son  - Contact SW once discharged from rehab         Patient verbalizes understanding of instructions provided today and agrees to view in [place].   Follow Up Plan: Contact me upon rehab discharge  Daneen Schick, BSW, CDP Social Worker, Certified Dementia Practitioner [place] [facility] [phone]

## 2021-07-02 ENCOUNTER — Inpatient Hospital Stay: Payer: Medicare Other | Admitting: Nurse Practitioner

## 2021-07-03 ENCOUNTER — Ambulatory Visit: Payer: Medicare Other | Admitting: Internal Medicine

## 2021-07-04 ENCOUNTER — Encounter: Payer: Medicare Other | Admitting: Internal Medicine

## 2021-07-04 DIAGNOSIS — I639 Cerebral infarction, unspecified: Secondary | ICD-10-CM | POA: Diagnosis not present

## 2021-07-04 DIAGNOSIS — I421 Obstructive hypertrophic cardiomyopathy: Secondary | ICD-10-CM | POA: Diagnosis not present

## 2021-07-04 DIAGNOSIS — R2689 Other abnormalities of gait and mobility: Secondary | ICD-10-CM | POA: Diagnosis not present

## 2021-07-04 DIAGNOSIS — M6281 Muscle weakness (generalized): Secondary | ICD-10-CM | POA: Diagnosis not present

## 2021-07-05 DIAGNOSIS — E1122 Type 2 diabetes mellitus with diabetic chronic kidney disease: Secondary | ICD-10-CM | POA: Diagnosis not present

## 2021-07-05 DIAGNOSIS — M4802 Spinal stenosis, cervical region: Secondary | ICD-10-CM | POA: Diagnosis not present

## 2021-07-05 DIAGNOSIS — R2689 Other abnormalities of gait and mobility: Secondary | ICD-10-CM | POA: Diagnosis not present

## 2021-07-05 DIAGNOSIS — M25511 Pain in right shoulder: Secondary | ICD-10-CM | POA: Diagnosis not present

## 2021-07-05 DIAGNOSIS — I10 Essential (primary) hypertension: Secondary | ICD-10-CM | POA: Diagnosis not present

## 2021-07-05 DIAGNOSIS — M6281 Muscle weakness (generalized): Secondary | ICD-10-CM | POA: Diagnosis not present

## 2021-07-05 DIAGNOSIS — N1831 Chronic kidney disease, stage 3a: Secondary | ICD-10-CM | POA: Diagnosis not present

## 2021-07-08 ENCOUNTER — Ambulatory Visit (INDEPENDENT_AMBULATORY_CARE_PROVIDER_SITE_OTHER): Payer: Medicare Other

## 2021-07-08 DIAGNOSIS — N1831 Chronic kidney disease, stage 3a: Secondary | ICD-10-CM

## 2021-07-08 DIAGNOSIS — E1122 Type 2 diabetes mellitus with diabetic chronic kidney disease: Secondary | ICD-10-CM

## 2021-07-08 DIAGNOSIS — I10 Essential (primary) hypertension: Secondary | ICD-10-CM

## 2021-07-08 NOTE — Chronic Care Management (AMB) (Signed)
Chronic Care Management    Social Work Note  07/08/2021 Name: Anna Hoffman MRN: [id number] DOB: [birth year]  Anna Hoffman is a 86 y.o. year old female who is a primary care patient of Glendale Chard, MD. The CCM team was consulted to assist the patient with chronic disease management and/or care coordination needs related to:  HTN, DM II .   Engaged with patients son [name] by phone  for follow up visit in response to provider referral for social work chronic care management and care coordination services.   Consent to Services:  The patient was given information about Chronic Care Management services, agreed to services, and gave verbal consent prior to initiation of services.  Please see initial visit note for detailed documentation.   Patient agreed to services and consent obtained.   Assessment: Review of patient past medical history, allergies, medications, and health status, including review of relevant consultants reports was performed today as part of a comprehensive evaluation and provision of chronic care management and care coordination services.     SDOH (Social Determinants of Health) assessments and interventions performed:    Advanced Directives Status: Not addressed in this encounter.  CCM Care Plan  Allergies  Allergen Reactions   Amoxicillin Diarrhea    Has patient had a PCN reaction causing immediate rash, facial/tongue/throat swelling, SOB or lightheadedness with hypotension: no Has patient had a PCN reaction causing severe rash involving mucus membranes or skin necrosis: no Has patient had a PCN reaction that required hospitalization: no pharmacist consult Has patient had a PCN reaction occurring within the last 10 years: yes If all of the above answers are "NO", then may proceed with Cephalosporin use.    Ampicillin Rash    - Tolerates Rocephin and Ancef - Remote occurrence; no symptoms of anaphylaxis or severe cutaneous reaction, and no additional medical  attention required     Sulfa Antibiotics Rash    Outpatient Encounter Medications as of 07/08/2021  Medication Sig   acetaminophen (TYLENOL) 325 MG tablet Take 2 tablets (650 mg total) by mouth every 6 (six) hours as needed for mild pain (or Fever >/= 101).   amLODipine (NORVASC) 10 MG tablet Take 1 tablet (10 mg total) by mouth daily.   Ascorbic Acid (VITAMIN C) 1000 MG tablet Take 1,000 mg by mouth daily.   azelastine (ASTELIN) 0.1 % nasal spray Place 2 sprays into both nostrils daily as needed for rhinitis.   Carboxymethylcellul-Glycerin (LUBRICATING EYE DROPS OP) Place 1 drop into both eyes daily as needed (dry eyes).   Cyanocobalamin (VITAMIN B12) 500 MCG TABS Take 500 mcg by mouth daily.   fexofenadine (ALLEGRA ALLERGY) 180 MG tablet Take 1 tablet (180 mg total) by mouth daily.   glucose blood test strip Use as directed to check blood sugars 1 time per day.   glucose monitoring kit (FREESTYLE) monitoring kit Use as directed to check blood sugars 1 time per day dx: e11.22   hydrALAZINE (APRESOLINE) 100 MG tablet Take 1 tablet (100 mg total) by mouth every 8 (eight) hours.   insulin aspart (NOVOLOG) 100 UNIT/ML injection Inject 0-9 Units into the skin 3 (three) times daily with meals.   Lancets (ONETOUCH DELICA PLUS [id number]) MISC Use to check blood sugar once daily   metoprolol tartrate (LOPRESSOR) 25 MG tablet Take 0.5 tablets (12.5 mg total) by mouth 2 (two) times daily.   ondansetron (ZOFRAN) 4 MG/2ML SOLN injection Inject 2 mLs (4 mg total) into the vein every 6 (six) hours  as needed for nausea or vomiting.   pantoprazole (PROTONIX) 40 MG tablet Give 1 tablet by mouth 2 times a day related to gastro-esophageal reflux disease without esophagitis   prochlorperazine (COMPAZINE) 5 MG tablet Take 1 tablet (5 mg total) by mouth every 6 (six) hours as needed for nausea or vomiting.   rosuvastatin (CRESTOR) 20 MG tablet Take 1 tablet (20 mg total) by mouth daily.   trolamine salicylate  (ASPERCREME) 10 % cream Apply 1 application topically daily as needed for muscle pain.   Facility-Administered Encounter Medications as of 07/08/2021  Medication   cyanocobalamin ((VITAMIN B-12)) injection 1,000 mcg    Patient Active Problem List   Diagnosis Date Noted   Acute metabolic encephalopathy    Malnutrition of moderate degree 05/24/2021   Sepsis ([place]) 05/23/2021   SIRS (systemic inflammatory response syndrome) ([place]) 05/22/2021   History of CVA (cerebrovascular accident) 05/22/2021   Gait instability 04/24/2021   Seasonal allergic rhinitis due to pollen 04/24/2021   Ascending aorta dilatation ([place]) 04/05/2021   HOCM (hypertrophic obstructive cardiomyopathy) ([place]) 04/04/2021   Physical deconditioning 04/04/2021   Syncope 04/03/2021   Fall at home, initial encounter 03/31/2021   Thyroid nodule 03/31/2021   CAP (community acquired pneumonia) 11/15/2020   Dehydration 11/15/2020   Erosive esophagitis    Hiatal hernia    Hyponatremia 10/10/2020   Acute kidney injury superimposed on chronic kidney disease ([place]) 10/10/2020   Aortic atherosclerosis ([place]) 10/09/2020   Acute renal failure superimposed on stage 3a chronic kidney disease ([place]) 10/09/2020   Hyperkalemia 10/09/2020   Hearing loss 10/09/2020   Prolonged QT interval 10/09/2020   Colitis, acute 08/11/2019   Colitis 07/11/2019   Loose stools 06/07/2019   Herpes zoster without complication 62/22/9798   Other long term (current) drug therapy 08/12/2018   Chronic kidney disease, stage 3a ([place]) 07/19/2018   Hypertensive nephropathy 07/19/2018   Community acquired pneumonia of right upper lobe of lung 11/07/2017   Hypertensive emergency 07/01/2016   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[place])    Benign essential HTN    Type 2 diabetes mellitus with stage 3 chronic kidney disease, without long-term current use of insulin ([place])    Mixed hyperlipidemia    ICH (intracerebral hemorrhage)  ([place]) - hypertensive R thalamic hemorrhage  06/27/2016   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   Retroperitoneal bleed 02/08/2016   Generalized abdominal pain    Gastroenteritis 02/04/2016   Diarrhea 02/04/2016   Hypokalemia 02/04/2016   Incarcerated paraesophageal hernia 09/02/2014   Renal mass, left 09/02/2014   Acute esophagitis 09/02/2014   Gastric outlet obstruction 09/02/2014   Hypertension    Vomiting 09/01/2014    Conditions to be addressed/monitored: HTN and DMII; Level of care concerns  Care Plan : Social Work Care Plan  Updates made by Daneen Schick since 07/08/2021 12:00 AM     Problem: Mobility and Independence      Goal: Mobility and Independence Optimized   Start Date: 04/24/2021  Expected End Date: 06/08/2021  This Visit's Progress: On track  Priority: High  Note:   Current Barriers:  Chronic disease management support and education needs related to HTN and DM  ADL IADL limitations and Limited access to caregiver  Social Worker Clinical Goal(s):  patient will work with SW to identify and address any acute and/or chronic care coordination needs related to the self health management of HTN and DM  patient will work with SW to address concerns related to level of  care needs  SW Interventions:  Inter-disciplinary care team collaboration (see longitudinal plan of care) Collaboration with Glendale Chard, MD regarding development and update of comprehensive plan of care as evidenced by provider attestation and co-signature Telephonic visit completed with patients son [name]  Discussed the patient is currently home from rehab - patients son is staying with the patient and also has a caregiver available to assist with care needs Performed chart review to note patient has an [place] office visit scheduled for 12/7 Reviewed caregiver assistance options including PCS, CAP, and long term care placement Determined [name] is most interested in placement as PCS and CAP  are not programs for 24/7 support and he feels the patient is not safe to stay alone nor does she have the resources to privately hire a caregiver long term Discussed once the FL2 is completed following upcomming office visit SW will begin seeking a bed offer   Patient Goals/Self-Care Activities patient will: with the help of her son  -  Attend office visit scheduled for 12.7.22  Follow Up Plan:  SW will collaborate with primary care provider regarding FL2 completion       Follow Up Plan:  SW will seek placement once completed FL2 is received.       Daneen Schick, BSW, CDP Social Worker, Certified Dementia Practitioner [place] [facility] [phone]

## 2021-07-08 NOTE — Patient Instructions (Signed)
Social Worker Visit Information  Goals we discussed today:   Goals Addressed             This Visit's Progress    Mobility and Independence Optimized   On track    Timeframe:  Short-Term Goal Priority:  High Start Date:  9.21.22                           Expected End Date: 11.5.22                       Patient Goals/Self-Care Activities patient will: with the help of her son  - Attend office visit scheduled for 12.7.22          Materials Provided: Verbal education about caregiver programs covered under patients health plan provided by phone  Patient verbalizes understanding of instructions provided today and agrees to view in [place].   Follow Up Plan:  SW will begin seeking placement once a completed FL2 is obtained.   Daneen Schick, BSW, CDP Social Worker, Certified Dementia Practitioner [place] [facility] [phone]

## 2021-07-09 ENCOUNTER — Other Ambulatory Visit (HOSPITAL_COMMUNITY): Payer: Self-pay

## 2021-07-09 MED ORDER — METOPROLOL TARTRATE 25 MG PO TABS
12.5000 mg | ORAL_TABLET | Freq: Two times a day (BID) | ORAL | 0 refills | Status: DC
  Filled 2021-07-09 (×2): qty 60, 60d supply, fill #0

## 2021-07-09 MED ORDER — PANTOPRAZOLE SODIUM 40 MG PO TBEC
40.0000 mg | DELAYED_RELEASE_TABLET | Freq: Two times a day (BID) | ORAL | 0 refills | Status: DC
  Filled 2021-07-09: qty 60, 30d supply, fill #0

## 2021-07-09 MED ORDER — AMLODIPINE BESYLATE 10 MG PO TABS
10.0000 mg | ORAL_TABLET | Freq: Every day | ORAL | 0 refills | Status: AC
  Filled 2021-07-09: qty 30, 30d supply, fill #0

## 2021-07-09 MED ORDER — DICLOFENAC SODIUM 1 % EX GEL
2.0000 g | Freq: Two times a day (BID) | CUTANEOUS | 0 refills | Status: DC
Start: 2021-06-26 — End: 2021-08-24
  Filled 2021-07-09: qty 100, 25d supply, fill #0

## 2021-07-09 MED ORDER — INSULIN ASPART 100 UNIT/ML FLEXPEN
10.0000 [IU] | PEN_INJECTOR | Freq: Every day | SUBCUTANEOUS | 0 refills | Status: AC
  Filled 2021-07-09: qty 9, 30d supply, fill #0

## 2021-07-09 MED ORDER — HYDRALAZINE HCL 100 MG PO TABS
100.0000 mg | ORAL_TABLET | Freq: Three times a day (TID) | ORAL | 0 refills | Status: DC
Start: 2021-06-26 — End: 2021-08-28
  Filled 2021-07-09: qty 90, 30d supply, fill #0

## 2021-07-10 ENCOUNTER — Ambulatory Visit (INDEPENDENT_AMBULATORY_CARE_PROVIDER_SITE_OTHER): Payer: Medicare Other | Admitting: Internal Medicine

## 2021-07-10 ENCOUNTER — Encounter: Payer: Self-pay | Admitting: Internal Medicine

## 2021-07-10 ENCOUNTER — Other Ambulatory Visit (HOSPITAL_COMMUNITY): Payer: Self-pay

## 2021-07-10 ENCOUNTER — Other Ambulatory Visit: Payer: Self-pay

## 2021-07-10 ENCOUNTER — Other Ambulatory Visit: Payer: Self-pay | Admitting: Internal Medicine

## 2021-07-10 VITALS — BP 136/78 | HR 56 | Temp 99.0°F | Ht 65.0 in | Wt 125.0 lb

## 2021-07-10 DIAGNOSIS — I421 Obstructive hypertrophic cardiomyopathy: Secondary | ICD-10-CM

## 2021-07-10 DIAGNOSIS — R197 Diarrhea, unspecified: Secondary | ICD-10-CM | POA: Diagnosis not present

## 2021-07-10 DIAGNOSIS — E1122 Type 2 diabetes mellitus with diabetic chronic kidney disease: Secondary | ICD-10-CM | POA: Diagnosis not present

## 2021-07-10 DIAGNOSIS — R2681 Unsteadiness on feet: Secondary | ICD-10-CM

## 2021-07-10 DIAGNOSIS — I1310 Hypertensive heart and chronic kidney disease without heart failure, with stage 1 through stage 4 chronic kidney disease, or unspecified chronic kidney disease: Secondary | ICD-10-CM | POA: Diagnosis not present

## 2021-07-10 DIAGNOSIS — R195 Other fecal abnormalities: Secondary | ICD-10-CM | POA: Diagnosis not present

## 2021-07-10 DIAGNOSIS — N1831 Chronic kidney disease, stage 3a: Secondary | ICD-10-CM | POA: Diagnosis not present

## 2021-07-10 LAB — CBC WITH DIFFERENTIAL/PLATELET
Basophils Absolute: 0 10*3/uL (ref 0.0–0.2)
Basos: 0 %
EOS (ABSOLUTE): 0.1 10*3/uL (ref 0.0–0.4)
Eos: 1 %
Hematocrit: 32.9 % — ABNORMAL LOW (ref 34.0–46.6)
Hemoglobin: 10.9 g/dL — ABNORMAL LOW (ref 11.1–15.9)
Lymphocytes Absolute: 2 10*3/uL (ref 0.7–3.1)
Lymphs: 14 %
MCH: 27.9 pg (ref 26.6–33.0)
MCHC: 33.1 g/dL (ref 31.5–35.7)
MCV: 84 fL (ref 79–97)
Monocytes Absolute: 0.9 10*3/uL (ref 0.1–0.9)
Monocytes: 6 %
Neutrophils Absolute: 11.2 10*3/uL — ABNORMAL HIGH (ref 1.4–7.0)
Neutrophils: 79 %
Platelets: 529 10*3/uL — ABNORMAL HIGH (ref 150–450)
RBC: 3.91 x10E6/uL (ref 3.77–5.28)
RDW: 15.2 % (ref 11.7–15.4)
WBC: 14.1 10*3/uL — ABNORMAL HIGH (ref 3.4–10.8)

## 2021-07-10 LAB — CMP14+EGFR
ALT: 13 IU/L (ref 0–32)
AST: 17 IU/L (ref 0–40)
Albumin/Globulin Ratio: 1.6 (ref 1.2–2.2)
Albumin: 4.2 g/dL (ref 3.6–4.6)
Alkaline Phosphatase: 88 IU/L (ref 44–121)
BUN/Creatinine Ratio: 15 (ref 12–28)
BUN: 26 mg/dL (ref 8–27)
Bilirubin Total: 0.3 mg/dL (ref 0.0–1.2)
CO2: 17 mmol/L — ABNORMAL LOW (ref 20–29)
Calcium: 9.5 mg/dL (ref 8.7–10.3)
Chloride: 107 mmol/L — ABNORMAL HIGH (ref 96–106)
Creatinine, Ser: 1.78 mg/dL — ABNORMAL HIGH (ref 0.57–1.00)
Globulin, Total: 2.6 g/dL (ref 1.5–4.5)
Glucose: 89 mg/dL (ref 70–99)
Potassium: 3.9 mmol/L (ref 3.5–5.2)
Sodium: 136 mmol/L (ref 134–144)
Total Protein: 6.8 g/dL (ref 6.0–8.5)
eGFR: 28 mL/min/{1.73_m2} — ABNORMAL LOW (ref >59)

## 2021-07-10 LAB — POC HEMOCCULT BLD/STL (OFFICE/1-CARD/DIAGNOSTIC): Fecal Occult Blood, POC: NEGATIVE

## 2021-07-10 MED ORDER — SUCRALFATE 1 GM/10ML PO SUSP
1.0000 g | Freq: Three times a day (TID) | ORAL | 1 refills | Status: DC
  Filled 2021-07-10: qty 840, 21d supply, fill #0

## 2021-07-10 NOTE — Patient Instructions (Signed)
Bloody Diarrhea Bloody diarrhea is frequent loose and watery bowel movements that contain blood. The blood can be hard to see or notice (occult). Bloody diarrhea may be caused by medical conditions such as: Ulcerative colitis. Crohn's disease. Intestinal infection. Viral gastroenteritis or bacterial gastroenteritis. Finding out why there is blood in your diarrhea is necessary so that your health care provider can prescribe the right treatment for you. Follow the instructions from your health care provider about treating the cause of your bloody diarrhea. Any type of diarrhea can make you feel weak and dehydrated. Dehydration can make you tired and thirsty, cause you to have a dry mouth, and decrease how often you urinate. Follow these instructions at home: Eating and drinking   Follow these recommendations as told by your health care provider: Take an oral rehydration solution (ORS). This is an over-the-counter medicine that helps return your body to its normal balance of nutrients and water. It is found at pharmacies and retail stores. Drink enough fluid to keep your urine pale yellow. Drink fluids such as water, ice chips, diluted fruit juice, and low-calorie sports drinks. You can also drink milk products, if desired. Avoid drinking fluids that contain a lot of sugar or caffeine, such as energy drinks, regular sports drinks, and soda. Avoid alcohol. Eat bland, easy-to-digest foods in small amounts as you are able. These foods include bananas, applesauce, rice, lean meats, toast, and crackers. Avoid spicy or fatty foods.  Medicines Take over-the-counter and prescription medicines only as told by your health care provider. Your health care provider may prescribe medicine to slow down the frequency of diarrhea or to ease stomach discomfort. If you were prescribed an antibiotic medicine, take it as told by your health care provider. Do not stop using the antibiotic even if you start to feel  better. General instructions  Wash your hands often using soap and water. If soap and water are not available, use a hand sanitizer. Others in the household should wash their hands as well. Hands should be washed: After using the toilet or changing a diaper. Before preparing, cooking, or serving food. While caring for a sick person or while visiting someone in a hospital. Rest at home while you recover. Take a warm bath to relieve any burning or pain from frequent diarrhea episodes. Watch your condition for any changes. Keep all follow-up visits as told by your health care provider. This is important. Contact a health care provider if: You have a fever. Your diarrhea gets worse. You have new symptoms. You cannot keep fluids down. You feel light-headed or dizzy. You have a headache. You have muscle cramps. Get help right away if: You have chest pain. You feel extremely weak or you faint. The blood in your diarrhea increases or turns a different color. You vomit and the vomit is bloody or looks black. You have persistent diarrhea. You have severe pain, cramping, or bloating in your abdomen. You have trouble breathing or you are breathing very quickly. Your heart is beating very quickly. Your skin feels cold and clammy. You feel confused. You have signs of dehydration, such as: Dark urine, very little urine, or no urine. Cracked lips. Dry mouth. Sunken eyes. Sleepiness. Weakness. Summary Bloody diarrhea is frequent loose and watery bowel movements that contain blood. The blood can be hard to see or notice (occult). Follow the instructions from your health care provider about treating the cause of your bloody diarrhea. Any type of diarrhea can make you feel weak and dehydrated.  Follow your health care provider's recommendations for eating and drinking and for taking medicines. Contact your health care provider if your symptoms get worse. Get help right away if you have signs of  dehydration. This information is not intended to replace advice given to you by your health care provider. Make sure you discuss any questions you have with your health care provider. Document Revised: 01/30/2021 Document Reviewed: 01/30/2021 Elsevier Patient Education  [place].

## 2021-07-10 NOTE — Progress Notes (Signed)
I,Katawbba Wiggins,acting as a Education administrator for Maximino Greenland, MD.,have documented all relevant documentation on the behalf of Maximino Greenland, MD,as directed by  Maximino Greenland, MD while in the presence of Maximino Greenland, MD.  This visit occurred during the SARS-CoV-2 public health emergency.  Safety protocols were in place, including screening questions prior to the visit, additional usage of staff PPE, and extensive cleaning of exam room while observing appropriate contact time as indicated for disinfecting solutions.  Subjective:     Patient ID: Anna Hoffman , female    DOB: [birth year] , 86 y.o.   MRN: [id number]   Chief Complaint  Patient presents with   [facility] Form     HPI  The patient is here today for f/u after rehab. She also needs to be evaluated for placement into a skilled nursing facility. She is accompanied by her son, [name].   She was admitted to [facility] on 05/22/21 for further evaluation of nausea, vomiting and weakness for 2 to 3 days with difficulty ambulating and confusion for few weeks.  She also had a fall with some trauma to the right shoulder.  In the ED, patient was mildly tachycardic and hypertensive.  Creatinine was elevated at 1.82 from baseline 1.2.  There was mild leukocytosis but hemoglobin was 16.1.  X-ray of chest, CT head scan and pelvic scan were negative for acute findings except for left renal angiomyolipoma.  Patient received vancomycin cefepime and Flagyl in the ED and was admitted to hospital after IV fluid bolus. Cardiology saw the patient for her history of intermittent heart block and question hypertrophic cardiomyopathy; meds have been adjusted. On  05/26/2021, patient had accelerated hypertension and was less responsive.  Neurology was consulted.  There was a concern for stroke and cefepime induced neurotoxicity.  MRI showed minute stroke. Pt has since remained medically stable. She was discharged in stable condition on 11/3 to [facility] for  rehab. She was then discharged from there in stable condition on 07/05/21. Since then, she has developed diarrhea and progressive felt more weak. Her son also reports he has seen some blood in her stool. She admits she has not been eating much due to n/v.       Past Medical History:  Diagnosis Date   Arthritis    Diabetes mellitus without complication ([place])    Hypertension    Hypokalemia    Pneumonia    Shingles    Stroke [facility])      Family History  Problem Relation Age of Onset   Alzheimer's disease Mother    Prostate cancer Father    Brain cancer Brother    Brain cancer Brother    Pancreatic cancer Sister    Allergic rhinitis Neg Hx    Asthma Neg Hx    Eczema Neg Hx    Urticaria Neg Hx      Current Outpatient Medications:    acetaminophen (TYLENOL) 325 MG tablet, Take 2 tablets (650 mg total) by mouth every 6 (six) hours as needed for mild pain (or Fever >/= 101). (Patient not taking: Reported on 07/15/2021), Disp: , Rfl:    amLODipine (NORVASC) 10 MG tablet, Take 1 tablet (10 mg total) by mouth daily., Disp: 30 tablet, Rfl: 0   diclofenac Sodium (VOLTAREN) 1 % GEL, Apply 2 g topically every 12 (twelve) hours to left ankle and right shoulder pain, Disp: 100 g, Rfl: 0   glucose monitoring kit (FREESTYLE) monitoring kit, Use as directed to check  blood sugars 1 time per day dx: e11.22, Disp: 1 each, Rfl: 1   hydrALAZINE (APRESOLINE) 100 MG tablet, Take 1 tablet (100 mg total) by mouth every 8 (eight) hours., Disp: 90 tablet, Rfl: 0   metoprolol tartrate (LOPRESSOR) 25 MG tablet, Take 0.5 tablets (12.5 mg total) by mouth 2 (two) times daily., Disp: 60 tablet, Rfl: 0   pantoprazole (PROTONIX) 40 MG tablet, Take 1 tablet (40 mg total) by mouth 2 (two) times daily., Disp: 60 tablet, Rfl: 0   rosuvastatin (CRESTOR) 20 MG tablet, Take 1 tablet (20 mg total) by mouth daily., Disp: 90 tablet, Rfl: 1   aspirin 81 MG EC tablet, Take 1 tablet (81 mg total) by mouth daily. Swallow whole.,  Disp: 30 tablet, Rfl: 0   Vitamin D3 (VITAMIN D) 25 MCG tablet, Take 1 tablet (1,000 Units total) by mouth daily., Disp: 30 tablet, Rfl: 0   fexofenadine (ALLEGRA ALLERGY) 180 MG tablet, Take 1 tablet (180 mg total) by mouth daily. (Patient not taking: Reported on 07/10/2021), Disp: 90 tablet, Rfl: 2   glucose blood test strip, Use as directed to check blood sugars 1 time per day., Disp: 100 each, Rfl: 11   insulin aspart (NOVOLOG) 100 UNIT/ML FlexPen, Inject into the skin with meals per sliding scale. 151-200= 2 units, 201-250= 4 units, 251-300= 6 units, 301-350=8 units, 351-400=10 units, >401=10 units and notify MD (Patient taking differently: Inject 2-10 Units into the skin in the morning and at bedtime.), Disp: 9 mL, Rfl: 0   insulin aspart (NOVOLOG) 100 UNIT/ML injection, Inject 0-9 Units into the skin 3 (three) times daily with meals. (Patient not taking: Reported on 07/10/2021), Disp: 10 mL, Rfl: 11   Lancets (ONETOUCH DELICA PLUS MPNTIR44R) MISC, Use to check blood sugar once daily, Disp: 100 each, Rfl: 12   prochlorperazine (COMPAZINE) 5 MG tablet, Take 1 tablet (5 mg total) by mouth every 6 (six) hours as needed for nausea or vomiting. (Patient not taking: Reported on 07/10/2021), Disp: 30 tablet, Rfl: 0   sucralfate (CARAFATE) 1 GM/10ML suspension, Take 10 mLs (1 g total) by mouth 4 (four) times daily -  with meals and at bedtime for 28 days, Disp: 840 mL, Rfl: 1  Current Facility-Administered Medications:    cyanocobalamin ((VITAMIN B-12)) injection 1,000 mcg, 1,000 mcg, Intramuscular, Once, [place] Chard, MD   Allergies  Allergen Reactions   Amoxicillin Diarrhea    Has patient had a PCN reaction causing immediate rash, facial/tongue/throat swelling, SOB or lightheadedness with hypotension: no Has patient had a PCN reaction causing severe rash involving mucus membranes or skin necrosis: no Has patient had a PCN reaction that required hospitalization: no pharmacist consult Has patient had  a PCN reaction occurring within the last 10 years: yes If all of the above answers are "NO", then may proceed with Cephalosporin use.    Cefepime     Possible CNS toxicity 05/2021   Ampicillin Rash    - Tolerates Rocephin and Ancef - Remote occurrence; no symptoms of anaphylaxis or severe cutaneous reaction, and no additional medical attention required     Sulfa Antibiotics Rash     Review of Systems  Constitutional: Negative.   Respiratory: Negative.    Cardiovascular: Negative.   Gastrointestinal: Negative.   Psychiatric/Behavioral: Negative.    All other systems reviewed and are negative.   Today's Vitals   07/10/21 1110  BP: 136/78  Pulse: (!) 56  Temp: 99 F (37.2 C)  Weight: 125 lb (56.7 kg)  Height:  $'5\' 5"'z$  (1.651 m)  PainSc: 0-No pain   Body mass index is 20.8 kg/m.  Wt Readings from Last 3 Encounters:  07/13/21 122 lb 12.7 oz (55.7 kg)  07/10/21 125 lb (56.7 kg)  05/23/21 135 lb 5.8 oz (61.4 kg)    BP Readings from Last 3 Encounters:  07/15/21 (!) 147/80  07/10/21 136/78  06/06/21 (!) 148/78    Objective:  Physical Exam Vitals and nursing note reviewed.  Constitutional:      Appearance: Normal appearance. She is ill-appearing.     Comments: Able to get on exam table with assistance. B/l LE weakness  HENT:     Head: Normocephalic and atraumatic.     Nose:     Comments: Masked     Mouth/Throat:     Comments: Masked  Eyes:     Extraocular Movements: Extraocular movements intact.  Cardiovascular:     Rate and Rhythm: Normal rate and regular rhythm.     Heart sounds: Murmur heard.  Pulmonary:     Effort: Pulmonary effort is normal.     Breath sounds: Normal breath sounds.  Genitourinary:    Rectum: Guaiac result positive.     Comments: Decreased rectal tone Musculoskeletal:     Cervical back: Normal range of motion.  Skin:    General: Skin is warm.  Neurological:     General: No focal deficit present.     Mental Status: She is alert.   Psychiatric:        Mood and Affect: Mood normal.        Behavior: Behavior normal.        Assessment And Plan:     1. Diarrhea, unspecified type Comments: I will check stool cultures, studies. She will take Imodium prn for now. She is encouraged to eat baked/mashed potatoes and increase fluid intake as tolerated. I suspect she is dehydrated, which has led to syncopal episodes in the past.  - CMP14+EGFR - Culture, Stool - Clostridium difficile Toxin A/B - Fecal leukocytes - Ova and parasite examination  2. Heme positive stool Comments: Stool is brown in color, but heme positive. I will check CBC and iron panel today. Pt advised she may need transfusion if anemia has worsened.  - CBC with Diff - Iron, TIBC and Ferritin Panel - POC Hemoccult Bld/Stl (1-Cd Office Dx)  3. Type 2 diabetes mellitus with stage 3a chronic kidney disease, without long-term current use of insulin ([name]) Comments: Chronic, controlled off of meds.  - CMP14+EGFR - CBC with Diff  4. Hypertensive heart and renal disease with renal failure, stage 1 through stage 4 or unspecified chronic kidney disease, without heart failure Comments: Chronic, controlled. No change in meds. I will check CMP today, labs sent STAT.   5. Gait instability Comments: Her son advised me that Rehab was arranging for outpatient PT.    Patient was given opportunity to ask questions. Patient verbalized understanding of the plan and was able to repeat key elements of the plan. All questions were answered to their satisfaction.   I, Maximino Greenland, MD, have reviewed all documentation for this visit. The documentation on 07/15/21 for the exam, diagnosis, procedures, and orders are all accurate and complete.   IF YOU HAVE BEEN REFERRED TO A SPECIALIST, IT MAY TAKE 1-2 WEEKS TO SCHEDULE/PROCESS THE REFERRAL. IF YOU HAVE NOT HEARD FROM US/SPECIALIST IN TWO WEEKS, PLEASE GIVE [place] A CALL AT [phone].   THE PATIENT IS ENCOURAGED TO  PRACTICE SOCIAL DISTANCING  DUE TO THE COVID-19 PANDEMIC.

## 2021-07-11 ENCOUNTER — Telehealth: Payer: Medicare Other

## 2021-07-11 ENCOUNTER — Encounter (HOSPITAL_COMMUNITY): Payer: Self-pay | Admitting: Emergency Medicine

## 2021-07-11 ENCOUNTER — Inpatient Hospital Stay (HOSPITAL_COMMUNITY)
Admission: EM | Admit: 2021-07-11 | Discharge: 2021-07-15 | DRG: 689 | Disposition: A | Discharge status: Home Health | Payer: Medicare Other | Attending: Internal Medicine | Admitting: Internal Medicine

## 2021-07-11 ENCOUNTER — Other Ambulatory Visit (HOSPITAL_COMMUNITY): Payer: Self-pay

## 2021-07-11 ENCOUNTER — Emergency Department (HOSPITAL_COMMUNITY): Payer: Medicare Other

## 2021-07-11 DIAGNOSIS — I700 Atherosclerosis of aorta: Secondary | ICD-10-CM | POA: Diagnosis present

## 2021-07-11 DIAGNOSIS — E876 Hypokalemia: Secondary | ICD-10-CM | POA: Diagnosis present

## 2021-07-11 DIAGNOSIS — I2510 Atherosclerotic heart disease of native coronary artery without angina pectoris: Secondary | ICD-10-CM | POA: Diagnosis not present

## 2021-07-11 DIAGNOSIS — I248 Other forms of acute ischemic heart disease: Secondary | ICD-10-CM | POA: Diagnosis present

## 2021-07-11 DIAGNOSIS — I421 Obstructive hypertrophic cardiomyopathy: Secondary | ICD-10-CM | POA: Diagnosis present

## 2021-07-11 DIAGNOSIS — R519 Headache, unspecified: Secondary | ICD-10-CM | POA: Diagnosis not present

## 2021-07-11 DIAGNOSIS — D1771 Benign lipomatous neoplasm of kidney: Secondary | ICD-10-CM | POA: Diagnosis not present

## 2021-07-11 DIAGNOSIS — E1122 Type 2 diabetes mellitus with diabetic chronic kidney disease: Secondary | ICD-10-CM | POA: Diagnosis not present

## 2021-07-11 DIAGNOSIS — M1990 Unspecified osteoarthritis, unspecified site: Secondary | ICD-10-CM | POA: Diagnosis not present

## 2021-07-11 DIAGNOSIS — E860 Dehydration: Secondary | ICD-10-CM | POA: Diagnosis not present

## 2021-07-11 DIAGNOSIS — Z79899 Other long term (current) drug therapy: Secondary | ICD-10-CM

## 2021-07-11 DIAGNOSIS — R531 Weakness: Secondary | ICD-10-CM

## 2021-07-11 DIAGNOSIS — E872 Acidosis, unspecified: Secondary | ICD-10-CM | POA: Diagnosis not present

## 2021-07-11 DIAGNOSIS — Z8619 Personal history of other infectious and parasitic diseases: Secondary | ICD-10-CM

## 2021-07-11 DIAGNOSIS — Z90710 Acquired absence of both cervix and uterus: Secondary | ICD-10-CM | POA: Diagnosis not present

## 2021-07-11 DIAGNOSIS — E782 Mixed hyperlipidemia: Secondary | ICD-10-CM | POA: Diagnosis present

## 2021-07-11 DIAGNOSIS — E785 Hyperlipidemia, unspecified: Secondary | ICD-10-CM | POA: Diagnosis present

## 2021-07-11 DIAGNOSIS — J301 Allergic rhinitis due to pollen: Secondary | ICD-10-CM | POA: Diagnosis present

## 2021-07-11 DIAGNOSIS — N1831 Chronic kidney disease, stage 3a: Secondary | ICD-10-CM | POA: Diagnosis present

## 2021-07-11 DIAGNOSIS — Z8673 Personal history of transient ischemic attack (TIA), and cerebral infarction without residual deficits: Secondary | ICD-10-CM

## 2021-07-11 DIAGNOSIS — R42 Dizziness and giddiness: Secondary | ICD-10-CM | POA: Diagnosis not present

## 2021-07-11 DIAGNOSIS — I639 Cerebral infarction, unspecified: Secondary | ICD-10-CM | POA: Diagnosis not present

## 2021-07-11 DIAGNOSIS — G9341 Metabolic encephalopathy: Secondary | ICD-10-CM | POA: Diagnosis present

## 2021-07-11 DIAGNOSIS — N179 Acute kidney failure, unspecified: Secondary | ICD-10-CM | POA: Diagnosis not present

## 2021-07-11 DIAGNOSIS — M6281 Muscle weakness (generalized): Secondary | ICD-10-CM | POA: Diagnosis not present

## 2021-07-11 DIAGNOSIS — Z881 Allergy status to other antibiotic agents status: Secondary | ICD-10-CM

## 2021-07-11 DIAGNOSIS — K449 Diaphragmatic hernia without obstruction or gangrene: Secondary | ICD-10-CM | POA: Diagnosis not present

## 2021-07-11 DIAGNOSIS — Z20822 Contact with and (suspected) exposure to covid-19: Secondary | ICD-10-CM | POA: Diagnosis present

## 2021-07-11 DIAGNOSIS — K5730 Diverticulosis of large intestine without perforation or abscess without bleeding: Secondary | ICD-10-CM | POA: Diagnosis not present

## 2021-07-11 DIAGNOSIS — R000 Tachycardia, unspecified: Secondary | ICD-10-CM | POA: Diagnosis not present

## 2021-07-11 DIAGNOSIS — N281 Cyst of kidney, acquired: Secondary | ICD-10-CM | POA: Diagnosis not present

## 2021-07-11 DIAGNOSIS — I129 Hypertensive chronic kidney disease with stage 1 through stage 4 chronic kidney disease, or unspecified chronic kidney disease: Secondary | ICD-10-CM | POA: Diagnosis not present

## 2021-07-11 DIAGNOSIS — N390 Urinary tract infection, site not specified: Secondary | ICD-10-CM | POA: Diagnosis not present

## 2021-07-11 DIAGNOSIS — R112 Nausea with vomiting, unspecified: Secondary | ICD-10-CM | POA: Diagnosis not present

## 2021-07-11 DIAGNOSIS — R2689 Other abnormalities of gait and mobility: Secondary | ICD-10-CM | POA: Diagnosis not present

## 2021-07-11 DIAGNOSIS — R1110 Vomiting, unspecified: Secondary | ICD-10-CM | POA: Diagnosis not present

## 2021-07-11 DIAGNOSIS — Z794 Long term (current) use of insulin: Secondary | ICD-10-CM | POA: Diagnosis not present

## 2021-07-11 DIAGNOSIS — Z882 Allergy status to sulfonamides status: Secondary | ICD-10-CM

## 2021-07-11 LAB — TYPE AND SCREEN
ABO/RH(D): AB POS
Antibody Screen: NEGATIVE

## 2021-07-11 LAB — COMPREHENSIVE METABOLIC PANEL
ALT: 15 U/L (ref 0–44)
AST: 17 U/L (ref 15–41)
Albumin: 4.1 g/dL (ref 3.5–5.0)
Alkaline Phosphatase: 68 U/L (ref 38–126)
Anion gap: 12 (ref 5–15)
BUN: 24 mg/dL — ABNORMAL HIGH (ref 8–23)
CO2: 14 mmol/L — ABNORMAL LOW (ref 22–32)
Calcium: 9.4 mg/dL (ref 8.9–10.3)
Chloride: 111 mmol/L (ref 98–111)
Creatinine, Ser: 1.86 mg/dL — ABNORMAL HIGH (ref 0.44–1.00)
GFR, Estimated: 26 mL/min — ABNORMAL LOW (ref >60)
Glucose, Bld: 93 mg/dL (ref 70–99)
Potassium: 3.5 mmol/L (ref 3.5–5.1)
Sodium: 137 mmol/L (ref 135–145)
Total Bilirubin: 0.8 mg/dL (ref 0.3–1.2)
Total Protein: 7.9 g/dL (ref 6.5–8.1)

## 2021-07-11 LAB — CBC
HCT: 35.9 % — ABNORMAL LOW (ref 36.0–46.0)
Hemoglobin: 11.2 g/dL — ABNORMAL LOW (ref 12.0–15.0)
MCH: 28.1 pg (ref 26.0–34.0)
MCHC: 31.2 g/dL (ref 30.0–36.0)
MCV: 90.2 fL (ref 80.0–100.0)
Platelets: 475 10*3/uL — ABNORMAL HIGH (ref 150–400)
RBC: 3.98 MIL/uL (ref 3.87–5.11)
RDW: 15.7 % — ABNORMAL HIGH (ref 11.5–15.5)
WBC: 10.2 10*3/uL (ref 4.0–10.5)
nRBC: 0 % (ref 0.0–0.2)

## 2021-07-11 LAB — RESP PANEL BY RT-PCR (FLU A&B, COVID) ARPGX2
Influenza A by PCR: NEGATIVE
Influenza B by PCR: NEGATIVE
SARS Coronavirus 2 by RT PCR: NEGATIVE

## 2021-07-11 LAB — CBG MONITORING, ED: Glucose-Capillary: 74 mg/dL (ref 70–99)

## 2021-07-11 MED ORDER — LACTATED RINGERS IV BOLUS
1000.0000 mL | Freq: Once | INTRAVENOUS | Status: AC
  Administered 2021-07-12: 1000 mL via INTRAVENOUS

## 2021-07-11 NOTE — ED Triage Notes (Signed)
Patient complains of emesis, diarrhea and generalized fatigue with bloody stools 'for a while.' States she went to her PCP yesterday and was told she was anemic w/ low HMG at 10.9. Denies fevers, reports dizziness w/ position changes. States she recently got out of rehab after hospital admission.

## 2021-07-11 NOTE — ED Notes (Signed)
Son, [name] would like an update on his mom when you know something. He said she lives with him.Phone number in demographics.

## 2021-07-11 NOTE — ED Notes (Signed)
Attempted x2 IV attempt in R hand and R forearm without success.

## 2021-07-11 NOTE — ED Provider Notes (Signed)
[facility] DEPT Provider Note   CSN: [id number] Arrival date & time: 07/11/21  0950     History Chief Complaint  Patient presents with   Emesis   Fatigue    Anna Hoffman is a 86 y.o. female.  HPI     86 year old female with a history of diabetes, hypertension, CVA, CKD stage III, colitis, hypertrophic cardiomyopathy, intracerebral hemorrhage, recent admission one month ago for SIRS with no obvious source of infection, metabolic encephalopathy, acute/early subacute infarct, AKI, metabolic acidosis  Presents with generalized weakness Had the diarrhea for one day, stopped, then started again last week. Not having it now but had it last week.   Vomiting every time try to eat for a few days. Nausea. Was happening in rehab too--got out Saturday. Feeling very weak, generalized weakness, fatigue.  Worse in the last few days. No fever or urinary symptoms Passed blood in stool, for a few days, son was telling her, she had blood in stool.  But then reports no BM for last few days. Her dr said she was anemic and needed blood      Past Medical History:  Diagnosis Date   Arthritis    Diabetes mellitus without complication ([place])    Hypertension    Hypokalemia    Pneumonia    Shingles    Stroke [facility])     Patient Active Problem List   Diagnosis Date Noted   Acute metabolic encephalopathy    Malnutrition of moderate degree 05/24/2021   Sepsis ([place]) 05/23/2021   SIRS (systemic inflammatory response syndrome) ([place]) 05/22/2021   History of CVA (cerebrovascular accident) 05/22/2021   Gait instability 04/24/2021   Seasonal allergic rhinitis due to pollen 04/24/2021   Ascending aorta dilatation ([place]) 04/05/2021   HOCM (hypertrophic obstructive cardiomyopathy) ([place]) 04/04/2021   Physical deconditioning 04/04/2021   Syncope 04/03/2021   Fall at home, initial encounter 03/31/2021   Thyroid nodule 03/31/2021   CAP (community acquired pneumonia) 11/15/2020    Dehydration 11/15/2020   Erosive esophagitis    Hiatal hernia    Hyponatremia 10/10/2020   Acute kidney injury superimposed on chronic kidney disease ([place]) 10/10/2020   Aortic atherosclerosis ([place]) 10/09/2020   Acute renal failure superimposed on stage 3a chronic kidney disease ([place]) 10/09/2020   Hyperkalemia 10/09/2020   Hearing loss 10/09/2020   Prolonged QT interval 10/09/2020   Colitis, acute 08/11/2019   Colitis 07/11/2019   Loose stools 06/07/2019   Herpes zoster without complication 78/29/5621   Other long term (current) drug therapy 08/12/2018   Chronic kidney disease, stage 3a ([place]) 07/19/2018   Hypertensive nephropathy 07/19/2018   Community acquired pneumonia of right upper lobe of lung 11/07/2017   Hypertensive emergency 07/01/2016   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[place])    Benign essential HTN    Type 2 diabetes mellitus with stage 3 chronic kidney disease, without long-term current use of insulin ([place])    Mixed hyperlipidemia    ICH (intracerebral hemorrhage) ([place]) - hypertensive R thalamic hemorrhage  06/27/2016   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   Retroperitoneal bleed 02/08/2016   Generalized abdominal pain    Gastroenteritis 02/04/2016   Diarrhea 02/04/2016   Hypokalemia 02/04/2016   Incarcerated paraesophageal hernia 09/02/2014   Renal mass, left 09/02/2014   Acute esophagitis 09/02/2014   Gastric outlet obstruction 09/02/2014   Hypertension    Vomiting 09/01/2014    Past Surgical History:  Procedure Laterality Date   ABDOMINAL HYSTERECTOMY  BREAST EXCISIONAL BIOPSY Left    ESOPHAGOGASTRODUODENOSCOPY N/A 09/01/2014   Procedure: ESOPHAGOGASTRODUODENOSCOPY (EGD);  Surgeon: Lear Ng, MD;  Location: [facility];  Service: Endoscopy;  Laterality: N/A;   ESOPHAGOGASTRODUODENOSCOPY (EGD) WITH PROPOFOL N/A 10/12/2020   Procedure: ESOPHAGOGASTRODUODENOSCOPY (EGD) WITH PROPOFOL;  Surgeon: Mauri Pole, MD;  Location: [facility];  Service: Endoscopy;  Laterality: N/A;   HIATAL HERNIA REPAIR N/A 09/04/2014   Procedure: LAPAROSCOPIC REPAIR OF HIATAL HERNIA;  Surgeon: Excell Seltzer, MD;  Location: [facility]S;  Service: General;  Laterality: N/A;  With MESH   IR GENERIC HISTORICAL  04/10/2016   IR US GUIDE VASC ACCESS RIGHT 04/10/2016 Corrie Mckusick, DO [facility]   IR GENERIC HISTORICAL  04/10/2016   IR ANGIOGRAM SELECTIVE EACH ADDITIONAL VESSEL 04/10/2016 Corrie Mckusick, DO [facility]   IR GENERIC HISTORICAL  04/10/2016   IR ANGIOGRAM SELECTIVE EACH ADDITIONAL VESSEL 04/10/2016 Corrie Mckusick, DO [facility]   IR GENERIC HISTORICAL  04/10/2016   IR EMBO TUMOR ORGAN ISCHEMIA INFARCT INC GUIDE ROADMAPPING 04/10/2016 Corrie Mckusick, DO [facility]   IR GENERIC HISTORICAL  04/10/2016   IR ANGIOGRAM SELECTIVE EACH ADDITIONAL VESSEL 04/10/2016 Corrie Mckusick, DO [facility]   IR GENERIC HISTORICAL  04/10/2016   IR ANGIOGRAM SELECTIVE EACH ADDITIONAL VESSEL 04/10/2016 Corrie Mckusick, DO [facility]   IR GENERIC HISTORICAL  04/10/2016   IR RENAL SELECTIVE  UNI INC S&I MOD SED 04/10/2016 Corrie Mckusick, DO [facility]   IR GENERIC HISTORICAL  03/06/2016   IR RADIOLOGIST EVAL & MGMT 03/06/2016 Corrie Mckusick, DO [facility]   IR GENERIC HISTORICAL  03/26/2016   IR RADIOLOGIST EVAL & MGMT 03/26/2016 Corrie Mckusick, DO [facility]   IR GENERIC HISTORICAL  04/29/2016   IR RADIOLOGIST EVAL & MGMT 04/29/2016 [facility]   IR RADIOLOGIST EVAL & MGMT  11/12/2016   IR RADIOLOGIST EVAL & MGMT  12/16/2017   KNEE SURGERY     TONSILLECTOMY       OB History   No obstetric history on file.     Family History  Problem Relation Age of Onset   Alzheimer's disease Mother    Prostate cancer Father    Brain cancer Brother    Brain cancer Brother    Pancreatic cancer Sister    Allergic rhinitis Neg Hx    Asthma Neg Hx    Eczema Neg Hx    Urticaria Neg Hx     Social History   Tobacco Use   Smoking status:  Never   Smokeless tobacco: Never  Vaping Use   Vaping Use: Never used  Substance Use Topics   Alcohol use: No   Drug use: No    Home Medications Prior to Admission medications   Medication Sig Start Date End Date Taking? Authorizing Provider  acetaminophen (TYLENOL) 325 MG tablet Take 2 tablets (650 mg total) by mouth every 6 (six) hours as needed for mild pain (or Fever >/= 101). 02/08/16   Johnson, Clanford L, MD  amLODipine (NORVASC) 10 MG tablet Take 1 tablet (10 mg total) by mouth daily. 06/26/21     Ascorbic Acid (VITAMIN C) 1000 MG tablet Take 1,000 mg by mouth daily. Patient not taking: Reported on 07/10/2021    [provider]  azelastine (ASTELIN) 0.1 % nasal spray Place 2 sprays into both nostrils daily as needed for rhinitis. Patient not taking: Reported on 07/10/2021 10/13/20   Modena Jansky, MD  Carboxymethylcellul-Glycerin (LUBRICATING EYE DROPS OP) Place 1 drop  into both eyes daily as needed (dry eyes). Patient not taking: Reported on 07/10/2021    [provider]  Cyanocobalamin (VITAMIN B12) 500 MCG TABS Take 500 mcg by mouth daily.    [provider]  diclofenac Sodium (VOLTAREN) 1 % GEL Apply 2 g topically every 12 (twelve) hours to left ankle and right shoulder pain 06/26/21     fexofenadine (ALLEGRA ALLERGY) 180 MG tablet Take 1 tablet (180 mg total) by mouth daily. Patient not taking: Reported on 07/10/2021 04/23/21   Glendale Chard, MD  glucose blood test strip Use as directed to check blood sugars 1 time per day. 03/01/21   Glendale Chard, MD  glucose monitoring kit (FREESTYLE) monitoring kit Use as directed to check blood sugars 1 time per day dx: e11.22 03/01/21   Glendale Chard, MD  hydrALAZINE (APRESOLINE) 100 MG tablet Take 1 tablet (100 mg total) by mouth every 8 (eight) hours. 06/26/21     insulin aspart (NOVOLOG) 100 UNIT/ML FlexPen Inject into the skin with meals per sliding scale. 151-200= 2 units, 201-250= 4 units, 251-300= 6 units,  301-350=8 units, 351-400=10 units, >401=10 units and notify MD 06/26/21     insulin aspart (NOVOLOG) 100 UNIT/ML injection Inject 0-9 Units into the skin 3 (three) times daily with meals. Patient not taking: Reported on 07/10/2021 06/06/21   Donne Hazel, MD  Lancets [facility] DELICA PLUS [id number]) MISC Use to check blood sugar once daily 02/07/21     metoprolol tartrate (LOPRESSOR) 25 MG tablet Take 0.5 tablets (12.5 mg total) by mouth 2 (two) times daily. 06/26/21     pantoprazole (PROTONIX) 40 MG tablet Take 1 tablet (40 mg total) by mouth 2 (two) times daily. 06/26/21     prochlorperazine (COMPAZINE) 5 MG tablet Take 1 tablet (5 mg total) by mouth every 6 (six) hours as needed for nausea or vomiting. Patient not taking: Reported on 07/10/2021 05/22/21   Minette Brine, FNP  rosuvastatin (CRESTOR) 20 MG tablet Take 1 tablet (20 mg total) by mouth daily. 04/23/21   Glendale Chard, MD  sucralfate (CARAFATE) 1 GM/10ML suspension Take 10 mLs (1 g total) by mouth 4 (four) times daily -  with meals and at bedtime for 28 days 07/10/21   Glendale Chard, MD  trolamine salicylate (ASPERCREME) 10 % cream Apply 1 application topically daily as needed for muscle pain.    [provider]    Allergies    Amoxicillin, Ampicillin, and Sulfa antibiotics  Review of Systems   Review of Systems  Constitutional:  Positive for activity change, appetite change and fatigue. Negative for fever.  Respiratory:  Negative for cough and shortness of breath.   Cardiovascular:  Negative for chest pain (last night but resolved).  Gastrointestinal:  Positive for diarrhea (last week now improved), nausea and vomiting. Negative for abdominal pain. Blood in stool: yesterday, no BM today. Genitourinary:  Negative for dysuria.  Skin:  Negative for rash.  Neurological:  Positive for headaches.   Physical Exam Updated Vital Signs BP (!) 165/88 (BP Location: Left Arm)   Pulse (!) 113   Temp 98.6 F (37 C) (Oral)   Resp  18   LMP  (LMP Unknown)   SpO2 98%   Physical Exam Vitals and nursing note reviewed.  Constitutional:      General: She is not in acute distress.    Appearance: She is well-developed. She is ill-appearing. She is not diaphoretic.  HENT:     Head: Normocephalic and atraumatic.  Eyes:  Conjunctiva/sclera: Conjunctivae normal.  Cardiovascular:     Rate and Rhythm: Regular rhythm. Tachycardia present.     Heart sounds: Normal heart sounds. No murmur heard.   No friction rub. No gallop.  Pulmonary:     Effort: Pulmonary effort is normal. No respiratory distress.     Breath sounds: Normal breath sounds. No wheezing or rales.  Abdominal:     General: There is no distension.     Palpations: Abdomen is soft.     Tenderness: There is no abdominal tenderness. There is no guarding.  Musculoskeletal:        General: No tenderness.     Cervical back: Normal range of motion.  Skin:    General: Skin is warm and dry.     Findings: No erythema or rash.  Neurological:     Mental Status: She is alert and oriented to person, place, and time.    ED Results / Procedures / Treatments   Labs (all labs ordered are listed, but only abnormal results are displayed) Labs Reviewed  COMPREHENSIVE METABOLIC PANEL - Abnormal; Notable for the following components:      Result Value   CO2 14 (*)    BUN 24 (*)    Creatinine, Ser 1.86 (*)    GFR, Estimated 26 (*)    All other components within normal limits  CBC - Abnormal; Notable for the following components:   Hemoglobin 11.2 (*)    HCT 35.9 (*)    RDW 15.7 (*)    Platelets 475 (*)    All other components within normal limits  RESP PANEL BY RT-PCR (FLU A&B, COVID) [id number]  URINE CULTURE  URINALYSIS, ROUTINE W REFLEX MICROSCOPIC  TYPE AND SCREEN    EKG None  Radiology CT Head Wo Contrast  Result Date: 07/11/2021 CLINICAL DATA:  Headache, emesis, anemia EXAM: CT HEAD WITHOUT CONTRAST TECHNIQUE: Contiguous axial images were obtained from  the base of the skull through the vertex without intravenous contrast. COMPARISON:  05/26/2021 FINDINGS: Brain: Stable atrophy pattern and extensive white matter microvascular ischemic changes throughout both cerebral hemispheres. Remote basal ganglia infarcts. Small remote left cerebellar infarct. No acute intracranial hemorrhage, new mass lesion, new infarction, midline shift, herniation, hydrocephalus, or extra-axial fluid collection. Cisterns are patent. No cerebellar abnormality. Vascular: Intracranial atherosclerosis at the skull base. No hyperdense vessel. Skull: Normal. Negative for fracture or focal lesion. Sinuses/Orbits: No acute finding. Other: None. IMPRESSION: Stable atrophy and extensive white matter microvascular ischemic changes. No acute intracranial abnormality or significant change by noncontrast CT. Electronically Signed   By: Jerilynn Mages.  Shick M.D.   On: 07/11/2021 13:55    Procedures Procedures   Medications Ordered in ED Medications  lactated ringers bolus 1,000 mL (has no administration in time range)    ED Course  I have reviewed the triage vital signs and the nursing notes.  Pertinent labs & imaging results that were available during my care of the patient were reviewed by me and considered in my medical decision making (see chart for details).    MDM Rules/Calculators/A&P                               86 year old female with a history of diabetes, hypertension, CVA, CKD stage III, colitis, hypertrophic cardiomyopathy, intracerebral hemorrhage, recent admission one month ago for SIRS with no obvious source of infection, metabolic encephalopathy, acute/early subacute infarct, AKI, metabolic acidosis who presents with concern for decreased appetite,  nausea, vomiting, generalized weakness.  Son also concerned he saw possible blood in stool/toilet bowl. Described and stringy/red/not clots, no black tarry stool.  Given concern for bleeding yesterday and hemoglobin stable today,  do not feel she has clinically significant GI bleed.  No abdominal pain, doubt acute intraabdominal infection.   CT head completed given some headache with nausea and vomiting shows no evidence of intracranial abnormality.   Labs significant for nonanion gap metabolic acidosis. Likely related to diarrhea last week and dehydration.  Plan on giving IV fluid, evaluating UA to look for infection.  Has tachycardia, will check rhythm with EKG.  Flu/COVID testing negative.  She is unfortunately still awaiting a bed in the emergency department after my triage history and physical--care will be continued by new provider when she gets a room.  Plan to follow up UA, EKG, give IV fluids, reassess in setting of nausea, vomiting, dehydration.    Final Clinical Impression(s) / ED Diagnoses Final diagnoses:  Dehydration  Metabolic acidosis  Nausea and vomiting, unspecified vomiting type  Generalized weakness    Rx / DC Orders ED Discharge Orders     None        Gareth Morgan, MD 07/11/21 1636

## 2021-07-12 ENCOUNTER — Ambulatory Visit: Payer: Self-pay

## 2021-07-12 ENCOUNTER — Inpatient Hospital Stay (HOSPITAL_COMMUNITY): Payer: Medicare Other

## 2021-07-12 ENCOUNTER — Emergency Department (HOSPITAL_COMMUNITY): Payer: Medicare Other

## 2021-07-12 DIAGNOSIS — Z8673 Personal history of transient ischemic attack (TIA), and cerebral infarction without residual deficits: Secondary | ICD-10-CM | POA: Diagnosis not present

## 2021-07-12 DIAGNOSIS — I1310 Hypertensive heart and chronic kidney disease without heart failure, with stage 1 through stage 4 chronic kidney disease, or unspecified chronic kidney disease: Secondary | ICD-10-CM

## 2021-07-12 DIAGNOSIS — J301 Allergic rhinitis due to pollen: Secondary | ICD-10-CM | POA: Diagnosis present

## 2021-07-12 DIAGNOSIS — Z20822 Contact with and (suspected) exposure to covid-19: Secondary | ICD-10-CM | POA: Diagnosis present

## 2021-07-12 DIAGNOSIS — E876 Hypokalemia: Secondary | ICD-10-CM | POA: Diagnosis present

## 2021-07-12 DIAGNOSIS — G9341 Metabolic encephalopathy: Secondary | ICD-10-CM | POA: Diagnosis present

## 2021-07-12 DIAGNOSIS — I421 Obstructive hypertrophic cardiomyopathy: Secondary | ICD-10-CM | POA: Diagnosis present

## 2021-07-12 DIAGNOSIS — I2510 Atherosclerotic heart disease of native coronary artery without angina pectoris: Secondary | ICD-10-CM | POA: Diagnosis present

## 2021-07-12 DIAGNOSIS — N390 Urinary tract infection, site not specified: Secondary | ICD-10-CM | POA: Diagnosis present

## 2021-07-12 DIAGNOSIS — N1831 Chronic kidney disease, stage 3a: Secondary | ICD-10-CM | POA: Diagnosis present

## 2021-07-12 DIAGNOSIS — Z8619 Personal history of other infectious and parasitic diseases: Secondary | ICD-10-CM | POA: Diagnosis not present

## 2021-07-12 DIAGNOSIS — E782 Mixed hyperlipidemia: Secondary | ICD-10-CM | POA: Diagnosis present

## 2021-07-12 DIAGNOSIS — M1990 Unspecified osteoarthritis, unspecified site: Secondary | ICD-10-CM | POA: Diagnosis present

## 2021-07-12 DIAGNOSIS — E872 Acidosis, unspecified: Secondary | ICD-10-CM | POA: Diagnosis present

## 2021-07-12 DIAGNOSIS — I700 Atherosclerosis of aorta: Secondary | ICD-10-CM | POA: Diagnosis present

## 2021-07-12 DIAGNOSIS — R531 Weakness: Secondary | ICD-10-CM | POA: Diagnosis not present

## 2021-07-12 DIAGNOSIS — Z79899 Other long term (current) drug therapy: Secondary | ICD-10-CM | POA: Diagnosis not present

## 2021-07-12 DIAGNOSIS — N179 Acute kidney failure, unspecified: Secondary | ICD-10-CM | POA: Diagnosis present

## 2021-07-12 DIAGNOSIS — I248 Other forms of acute ischemic heart disease: Secondary | ICD-10-CM | POA: Diagnosis present

## 2021-07-12 DIAGNOSIS — E785 Hyperlipidemia, unspecified: Secondary | ICD-10-CM | POA: Diagnosis present

## 2021-07-12 DIAGNOSIS — Z881 Allergy status to other antibiotic agents status: Secondary | ICD-10-CM | POA: Diagnosis not present

## 2021-07-12 DIAGNOSIS — E1122 Type 2 diabetes mellitus with diabetic chronic kidney disease: Secondary | ICD-10-CM | POA: Diagnosis present

## 2021-07-12 DIAGNOSIS — I129 Hypertensive chronic kidney disease with stage 1 through stage 4 chronic kidney disease, or unspecified chronic kidney disease: Secondary | ICD-10-CM | POA: Diagnosis present

## 2021-07-12 DIAGNOSIS — E860 Dehydration: Secondary | ICD-10-CM | POA: Diagnosis present

## 2021-07-12 DIAGNOSIS — Z794 Long term (current) use of insulin: Secondary | ICD-10-CM | POA: Diagnosis not present

## 2021-07-12 DIAGNOSIS — Z90710 Acquired absence of both cervix and uterus: Secondary | ICD-10-CM | POA: Diagnosis not present

## 2021-07-12 LAB — BLOOD GAS, VENOUS
Acid-base deficit: 7.1 mmol/L — ABNORMAL HIGH (ref 0.0–2.0)
Bicarbonate: 17.2 mmol/L — ABNORMAL LOW (ref 20.0–28.0)
O2 Saturation: 79.4 %
Patient temperature: 98.6
pCO2, Ven: 31.7 mmHg — ABNORMAL LOW (ref 44.0–60.0)
pH, Ven: 7.353 (ref 7.250–7.430)
pO2, Ven: 45.9 mmHg — ABNORMAL HIGH (ref 32.0–45.0)

## 2021-07-12 LAB — URINALYSIS, ROUTINE W REFLEX MICROSCOPIC
Glucose, UA: NEGATIVE mg/dL
Hgb urine dipstick: NEGATIVE
Ketones, ur: NEGATIVE mg/dL
Nitrite: NEGATIVE
Specific Gravity, Urine: 1.02 (ref 1.005–1.030)
pH: 5.5 (ref 5.0–8.0)

## 2021-07-12 LAB — TROPONIN I (HIGH SENSITIVITY)
Troponin I (High Sensitivity): 43 ng/L — ABNORMAL HIGH (ref <18)
Troponin I (High Sensitivity): 40 ng/L — ABNORMAL HIGH (ref <18)

## 2021-07-12 LAB — BASIC METABOLIC PANEL
Anion gap: 10 (ref 5–15)
BUN: 27 mg/dL — ABNORMAL HIGH (ref 8–23)
CO2: 16 mmol/L — ABNORMAL LOW (ref 22–32)
Calcium: 9.2 mg/dL (ref 8.9–10.3)
Chloride: 108 mmol/L (ref 98–111)
Creatinine, Ser: 1.92 mg/dL — ABNORMAL HIGH (ref 0.44–1.00)
GFR, Estimated: 25 mL/min — ABNORMAL LOW (ref >60)
Glucose, Bld: 185 mg/dL — ABNORMAL HIGH (ref 70–99)
Potassium: 3.3 mmol/L — ABNORMAL LOW (ref 3.5–5.1)
Sodium: 134 mmol/L — ABNORMAL LOW (ref 135–145)

## 2021-07-12 LAB — CBG MONITORING, ED
Glucose-Capillary: 174 mg/dL — ABNORMAL HIGH (ref 70–99)
Glucose-Capillary: 86 mg/dL (ref 70–99)
Glucose-Capillary: 89 mg/dL (ref 70–99)

## 2021-07-12 LAB — CK: Total CK: 50 U/L (ref 38–234)

## 2021-07-12 LAB — MAGNESIUM: Magnesium: 2.1 mg/dL (ref 1.7–2.4)

## 2021-07-12 LAB — POC OCCULT BLOOD, ED: Fecal Occult Bld: NEGATIVE

## 2021-07-12 LAB — LACTIC ACID, PLASMA
Lactic Acid, Venous: 1.1 mmol/L (ref 0.5–1.9)
Lactic Acid, Venous: 0.8 mmol/L (ref 0.5–1.9)

## 2021-07-12 MED ORDER — SODIUM BICARBONATE 650 MG PO TABS
650.0000 mg | ORAL_TABLET | Freq: Two times a day (BID) | ORAL | Status: DC
  Administered 2021-07-13 – 2021-07-14 (×4): 650 mg via ORAL
  Filled 2021-07-12 (×4): qty 1

## 2021-07-12 MED ORDER — SODIUM BICARBONATE 8.4 % IV SOLN
50.0000 meq | Freq: Once | INTRAVENOUS | Status: AC
  Administered 2021-07-12: 50 meq via INTRAVENOUS
  Filled 2021-07-12: qty 50

## 2021-07-12 MED ORDER — INSULIN ASPART 100 UNIT/ML IJ SOLN
0.0000 [IU] | Freq: Three times a day (TID) | INTRAMUSCULAR | Status: DC
  Filled 2021-07-12: qty 0.06

## 2021-07-12 MED ORDER — ACETAMINOPHEN 325 MG PO TABS
650.0000 mg | ORAL_TABLET | Freq: Once | ORAL | Status: AC
  Administered 2021-07-12: 650 mg via ORAL
  Filled 2021-07-12: qty 2

## 2021-07-12 MED ORDER — POTASSIUM CHLORIDE CRYS ER 20 MEQ PO TBCR
40.0000 meq | EXTENDED_RELEASE_TABLET | Freq: Every day | ORAL | Status: DC
  Administered 2021-07-12 – 2021-07-15 (×4): 40 meq via ORAL
  Filled 2021-07-12 (×4): qty 2

## 2021-07-12 MED ORDER — SODIUM CHLORIDE 0.9 % IV SOLN
1.0000 g | Freq: Once | INTRAVENOUS | Status: AC
  Administered 2021-07-12: 1 g via INTRAVENOUS
  Filled 2021-07-12: qty 10

## 2021-07-12 MED ORDER — ACETAMINOPHEN 650 MG RE SUPP: 650.0000 mg | Freq: Four times a day (QID) | RECTAL | Status: DC | PRN

## 2021-07-12 MED ORDER — SODIUM CHLORIDE 0.9 % IV SOLN
1.0000 g | INTRAVENOUS | Status: DC
  Administered 2021-07-13 – 2021-07-14 (×2): 1 g via INTRAVENOUS
  Filled 2021-07-12 (×2): qty 10

## 2021-07-12 MED ORDER — ONDANSETRON HCL 4 MG/2ML IJ SOLN
4.0000 mg | Freq: Once | INTRAMUSCULAR | Status: AC
  Administered 2021-07-12: 4 mg via INTRAVENOUS
  Filled 2021-07-12: qty 2

## 2021-07-12 MED ORDER — SODIUM CHLORIDE 0.9 % IV BOLUS
1000.0000 mL | Freq: Once | INTRAVENOUS | Status: AC
  Administered 2021-07-12: 1000 mL via INTRAVENOUS

## 2021-07-12 MED ORDER — ACETAMINOPHEN 325 MG PO TABS
650.0000 mg | ORAL_TABLET | Freq: Four times a day (QID) | ORAL | Status: DC | PRN
  Administered 2021-07-12 – 2021-07-14 (×3): 650 mg via ORAL
  Filled 2021-07-12 (×4): qty 2

## 2021-07-12 MED ORDER — HYDRALAZINE HCL 20 MG/ML IJ SOLN: 10.0000 mg | Freq: Three times a day (TID) | INTRAMUSCULAR | Status: DC | PRN

## 2021-07-12 MED ORDER — PROCHLORPERAZINE EDISYLATE 10 MG/2ML IJ SOLN
10.0000 mg | Freq: Four times a day (QID) | INTRAMUSCULAR | Status: DC | PRN
  Administered 2021-07-14: 10 mg via INTRAVENOUS
  Filled 2021-07-12: qty 2

## 2021-07-12 NOTE — Chronic Care Management (AMB) (Signed)
Chronic Care Management    Social Work Note  07/12/2021 Name: Anna Hoffman MRN: [id number] DOB: 07-18-1935  Anna Hoffman is a 86 y.o. year old female who is a primary care patient of Glendale Chard, MD. The CCM team was consulted to assist the patient with chronic disease management and/or care coordination needs related to:  HTN, DM II .   Collaboration with patients primary care provider  for  completion of FL2 for long term placement  in response to provider referral for social work chronic care management and care coordination services.   Consent to Services:  The patient was given information about Chronic Care Management services, agreed to services, and gave verbal consent prior to initiation of services.  Please see initial visit note for detailed documentation.   Patient agreed to services and consent obtained.   Assessment: Review of patient past medical history, allergies, medications, and health status, including review of relevant consultants reports was performed today as part of a comprehensive evaluation and provision of chronic care management and care coordination services.     SDOH (Social Determinants of Health) assessments and interventions performed:    Advanced Directives Status: Not addressed in this encounter.  CCM Care Plan  Allergies  Allergen Reactions   Amoxicillin Diarrhea    Has patient had a PCN reaction causing immediate rash, facial/tongue/throat swelling, SOB or lightheadedness with hypotension: no Has patient had a PCN reaction causing severe rash involving mucus membranes or skin necrosis: no Has patient had a PCN reaction that required hospitalization: no pharmacist consult Has patient had a PCN reaction occurring within the last 10 years: yes If all of the above answers are "NO", then may proceed with Cephalosporin use.    Ampicillin Rash    - Tolerates Rocephin and Ancef - Remote occurrence; no symptoms of anaphylaxis or severe cutaneous  reaction, and no additional medical attention required     Sulfa Antibiotics Rash    Facility-Administered Encounter Medications as of 07/12/2021  Medication   cyanocobalamin ((VITAMIN B-12)) injection 1,000 mcg   lactated ringers bolus 1,000 mL   Outpatient Encounter Medications as of 07/12/2021  Medication Sig Note   acetaminophen (TYLENOL) 325 MG tablet Take 2 tablets (650 mg total) by mouth every 6 (six) hours as needed for mild pain (or Fever >/= 101).    amLODipine (NORVASC) 10 MG tablet Take 1 tablet (10 mg total) by mouth daily. 07/12/2021: 30 day supply 07/10/2021   Ascorbic Acid (VITAMIN C) 1000 MG tablet Take 1,000 mg by mouth daily. (Patient not taking: Reported on 07/10/2021)    azelastine (ASTELIN) 0.1 % nasal spray Place 2 sprays into both nostrils daily as needed for rhinitis. (Patient not taking: Reported on 07/10/2021)    Carboxymethylcellul-Glycerin (LUBRICATING EYE DROPS OP) Place 1 drop into both eyes daily as needed (dry eyes). (Patient not taking: Reported on 07/10/2021)    Cyanocobalamin (VITAMIN B12) 500 MCG TABS Take 500 mcg by mouth daily.    diclofenac Sodium (VOLTAREN) 1 % GEL Apply 2 g topically every 12 (twelve) hours to left ankle and right shoulder pain 07/12/2021: 07/10/2021   fexofenadine (ALLEGRA ALLERGY) 180 MG tablet Take 1 tablet (180 mg total) by mouth daily. (Patient not taking: Reported on 07/10/2021)    glucose blood test strip Use as directed to check blood sugars 1 time per day.    glucose monitoring kit (FREESTYLE) monitoring kit Use as directed to check blood sugars 1 time per day dx: e11.22  hydrALAZINE (APRESOLINE) 100 MG tablet Take 1 tablet (100 mg total) by mouth every 8 (eight) hours. 07/12/2021: 30 day supply 07/10/2021   insulin aspart (NOVOLOG) 100 UNIT/ML FlexPen Inject into the skin with meals per sliding scale. 151-200= 2 units, 201-250= 4 units, 251-300= 6 units, 301-350=8 units, 351-400=10 units, >401=10 units and notify MD 07/12/2021: 30  day supply #46m 07/10/2021   insulin aspart (NOVOLOG) 100 UNIT/ML injection Inject 0-9 Units into the skin 3 (three) times daily with meals. (Patient not taking: Reported on 07/10/2021)    Lancets (ONETOUCH DELICA PLUS [id number] MISC Use to check blood sugar once daily    metoprolol tartrate (LOPRESSOR) 25 MG tablet Take 0.5 tablets (12.5 mg total) by mouth 2 (two) times daily. 07/12/2021: 90 day supply 04/25/2021   pantoprazole (PROTONIX) 40 MG tablet Take 1 tablet (40 mg total) by mouth 2 (two) times daily.    prochlorperazine (COMPAZINE) 5 MG tablet Take 1 tablet (5 mg total) by mouth every 6 (six) hours as needed for nausea or vomiting. (Patient not taking: Reported on 07/10/2021)    rosuvastatin (CRESTOR) 20 MG tablet Take 1 tablet (20 mg total) by mouth daily. 07/12/2021: 90 day supply 04/24/2021   sucralfate (CARAFATE) 1 GM/10ML suspension Take 10 mLs (1 g total) by mouth 4 (four) times daily -  with meals and at bedtime for 28 days 07/12/2021: 21 day supply 188/50/2774  trolamine salicylate (ASPERCREME) 10 % cream Apply 1 application topically daily as needed for muscle pain.     Patient Active Problem List   Diagnosis Date Noted   Generalized weakness 112/87/8676  Acute metabolic encephalopathy    Malnutrition of moderate degree 05/24/2021   Sepsis ([place] 05/23/2021   SIRS (systemic inflammatory response syndrome) ([place] 05/22/2021   History of CVA (cerebrovascular accident) 05/22/2021   Gait instability 04/24/2021   Seasonal allergic rhinitis due to pollen 04/24/2021   Ascending aorta dilatation ([place] 04/05/2021   HOCM (hypertrophic obstructive cardiomyopathy) ([place] 04/04/2021   Physical deconditioning 04/04/2021   Syncope 04/03/2021   Fall at home, initial encounter 03/31/2021   Thyroid nodule 03/31/2021   CAP (community acquired pneumonia) 11/15/2020   Dehydration 11/15/2020   Erosive esophagitis    Hiatal hernia    Hyponatremia 10/10/2020   Acute kidney injury superimposed on  chronic kidney disease ([place] 10/10/2020   Aortic atherosclerosis ([place] 10/09/2020   Acute renal failure superimposed on stage 3a chronic kidney disease ([place] 10/09/2020   Hyperkalemia 10/09/2020   Hearing loss 10/09/2020   Prolonged QT interval 10/09/2020   Colitis, acute 08/11/2019   Colitis 07/11/2019   Loose stools 06/07/2019   Herpes zoster without complication 072/04/4708  Other long term (current) drug therapy 08/12/2018   Chronic kidney disease, stage 3a ([place] 07/19/2018   Hypertensive nephropathy 07/19/2018   Community acquired pneumonia of right upper lobe of lung 11/07/2017   Hypertensive emergency 07/01/2016   Thalamic hemorrhage ([place] 07/01/2016   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[place]    Benign essential HTN    Type 2 diabetes mellitus with stage 3 chronic kidney disease, without long-term current use of insulin ([facility]    Mixed hyperlipidemia    ICH (intracerebral hemorrhage) ([place] - hypertensive R thalamic hemorrhage  06/27/2016   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   Retroperitoneal bleed 02/08/2016   Generalized abdominal pain    Gastroenteritis 02/04/2016   Diarrhea 02/04/2016   Hypokalemia 02/04/2016   Incarcerated paraesophageal hernia 09/02/2014   Renal mass, left 09/02/2014   Acute esophagitis 09/02/2014  Gastric outlet obstruction 09/02/2014   Hypertension    Vomiting 09/01/2014    Conditions to be addressed/monitored: HTN and DMII; Level of care concerns  Care Plan : Social Work Care Plan  Updates made by Daneen Schick since 07/12/2021 12:00 AM     Problem: Mobility and Independence      Goal: Mobility and Independence Optimized   Start Date: 04/24/2021  Expected End Date: 06/08/2021  Recent Progress: On track  Priority: High  Note:   Current Barriers:  Chronic disease management support and education needs related to HTN and DM  ADL IADL limitations and Limited access to caregiver  Social Worker Clinical Goal(s):  patient will work with  SW to identify and address any acute and/or chronic care coordination needs related to the self health management of HTN and DM  patient will work with SW to address concerns related to level of care needs  SW Interventions:  Inter-disciplinary care team collaboration (see longitudinal plan of care) Collaboration with Glendale Chard, MD regarding development and update of comprehensive plan of care as evidenced by provider attestation and co-signature Obtained completed FL2 from patients primary care provider for long term placement Sent out to [place], [employer], [employer], [place], and [employer] seeking a long-term bed offer Performed chart review to note patient is currently in the Ed, notified Consulting civil engineer of patient current disposition  Patient Goals/Self-Care Activities patient will:  -  Follow ED provider recommendations  Follow Up Plan:  SW will follow up with status of bed offers over the next 3 days       Follow Up Plan:  SW will continue to follow.      Daneen Schick, BSW, CDP Social Worker, Certified Dementia Practitioner [place] [phone]

## 2021-07-12 NOTE — ED Provider Notes (Addendum)
Patient initially seen in triage by Dr. Billy Fischer.  Patient with history of diabetes, hypertension, previous stroke, recent admission for SIRS and metabolic encephalopathy here with generalized weakness with nausea and vomiting.  1 episode of diarrhea which has since resolved.  Too weak to stand up with lightheadedness and "vomiting everything I eat".  No fever.  No shortness of breath.  Did have episode of chest pain last night that lasted about 30 minutes and has since resolved. Was told by Dr. Today that she was anemic however hemoglobin here is stable. FOBT negative.   Dry mucous membranes, sinus tachycardia 110s.  Abdomen soft.  Labs in triage show non-anion gap acidosis with creatinine 1.8. Hemoglobin is stable.  Head CT is negative.  IV fluids and bicarb given. CT scan shows no acute pathology but does show Moderate gas within the bladder lumen. Correlation for recent  catheterization or cystoscopic procedure is recommended. Alternative  considerations such as aggressive infection or enteric fistula are  considered less likely given the lack of surrounding inflammatory  change.   No recent catheterization.  Heart rate is improving.  Patient remains too dizzy to stand.  Unable to obtain orthostatics.  Patient will need admission given her generalized weakness, persistent tachycardia, non-anion gap metabolic acidosis, AKI. UA with trace LE, WBCs, culture sent. IV rocephin given.  With persistent unsteadiness and dizziness, MR brain will be obtained.  Admission d/w Dr. Alcario Drought.    ED ECG REPORT   Date: 07/12/2021  Rate: 106  Rhythm: sinus tachycardia  QRS Axis: normal  Intervals: normal  ST/T Wave abnormalities: normal  Conduction Disutrbances:none  Narrative Interpretation:   Old EKG Reviewed: unchanged  I have personally reviewed the EKG tracing and agree with the computerized printout as noted.    Ezequiel Essex, MD 07/12/21 [id number]    Ezequiel Essex,  MD 07/12/21 [phone]

## 2021-07-12 NOTE — Patient Instructions (Signed)
Social Worker Visit Information  Goals we discussed today:   Goals Addressed             This Visit's Progress    Mobility and Independence Optimized       Timeframe:  Short-Term Goal Priority:  High Start Date:  9.21.22                           Expected End Date: 11.5.22                       Patient Goals/Self-Care Activities patient will:  - Follow ED provider recommendations          Materials Provided: No. Patient not reached.   Follow Up Plan:  SW will continue to follow to assist with long term placement  Daneen Schick, BSW, CDP Social Worker, Certified Dementia Practitioner [facility] [phone]

## 2021-07-12 NOTE — H&P (Signed)
History and Physical    Anna Hoffman KTG:[id number] DOB: 19-Aug-1934 DOA: 07/11/2021  PCP: Glendale Chard, MD  Patient coming from: Home  Chief Complaint: weakness  HPI: Anna Hoffman is a 86 y.o. female with medical history significant of DM2, HOCM, HTN, CKD3a. Presenting with generalized weakness and poor PO intake. History is per son. He reports that the patient came home from rehab on 6 days ago. She was ok for the first 2 days home. On the third day, she started having N/V/D. She didn't want to eat. The next day, noted an episode of dark stools. She tried imodium. This seemed to help the N/V/D as she has not had V or D since. However, she remains nauseous and weak. She does not want to eat. Yesterday her symptoms did not improve, so she asked to come to the ED.  ED Course: She was noted to have hypoK+ and HAGMA. UA was suspicious for UTI. She was started on fluids and abx. TRH was called for admission.   Review of Systems:  Review of systems is otherwise negative for all not mentioned in HPI.   PMHx Past Medical History:  Diagnosis Date   Arthritis    Diabetes mellitus without complication ([place])    Hypertension    Hypokalemia    Pneumonia    Shingles    Stroke [facility])     PSHx Past Surgical History:  Procedure Laterality Date   ABDOMINAL HYSTERECTOMY     BREAST EXCISIONAL BIOPSY Left    ESOPHAGOGASTRODUODENOSCOPY N/A 09/01/2014   Procedure: ESOPHAGOGASTRODUODENOSCOPY (EGD);  Surgeon: Lear Ng, MD;  Location: [facility];  Service: Endoscopy;  Laterality: N/A;   ESOPHAGOGASTRODUODENOSCOPY (EGD) WITH PROPOFOL N/A 10/12/2020   Procedure: ESOPHAGOGASTRODUODENOSCOPY (EGD) WITH PROPOFOL;  Surgeon: Mauri Pole, MD;  Location: [facility];  Service: Endoscopy;  Laterality: N/A;   HIATAL HERNIA REPAIR N/A 09/04/2014   Procedure: LAPAROSCOPIC REPAIR OF HIATAL HERNIA;  Surgeon: Excell Seltzer, MD;  Location: [facility];  Service: General;  Laterality: N/A;  With MESH    IR GENERIC HISTORICAL  04/10/2016   IR US GUIDE VASC ACCESS RIGHT 04/10/2016 Corrie Mckusick, DO [facility]   IR GENERIC HISTORICAL  04/10/2016   IR ANGIOGRAM SELECTIVE EACH ADDITIONAL VESSEL 04/10/2016 Corrie Mckusick, DO [facility]   IR GENERIC HISTORICAL  04/10/2016   IR ANGIOGRAM SELECTIVE EACH ADDITIONAL VESSEL 04/10/2016 Corrie Mckusick, DO [facility]   IR GENERIC HISTORICAL  04/10/2016   IR EMBO TUMOR ORGAN ISCHEMIA INFARCT INC GUIDE ROADMAPPING 04/10/2016 Corrie Mckusick, DO [facility]   IR GENERIC HISTORICAL  04/10/2016   IR ANGIOGRAM SELECTIVE EACH ADDITIONAL VESSEL 04/10/2016 Corrie Mckusick, DO [facility]   IR GENERIC HISTORICAL  04/10/2016   IR ANGIOGRAM SELECTIVE EACH ADDITIONAL VESSEL 04/10/2016 Corrie Mckusick, DO [facility]   IR GENERIC HISTORICAL  04/10/2016   IR RENAL SELECTIVE  UNI INC S&I MOD SED 04/10/2016 Corrie Mckusick, DO [facility]   IR GENERIC HISTORICAL  03/06/2016   IR RADIOLOGIST EVAL & MGMT 03/06/2016 Corrie Mckusick, DO [facility]   IR GENERIC HISTORICAL  03/26/2016   IR RADIOLOGIST EVAL & MGMT 03/26/2016 Corrie Mckusick, DO [facility]   IR GENERIC HISTORICAL  04/29/2016   IR RADIOLOGIST EVAL & MGMT 04/29/2016 [facility]   IR RADIOLOGIST EVAL & MGMT  11/12/2016   IR RADIOLOGIST EVAL & MGMT  12/16/2017   KNEE SURGERY     TONSILLECTOMY      SocHx  reports that she  has never smoked. She has never used smokeless tobacco. She reports that she does not drink alcohol and does not use drugs.  Allergies  Allergen Reactions   Amoxicillin Diarrhea    Has patient had a PCN reaction causing immediate rash, facial/tongue/throat swelling, SOB or lightheadedness with hypotension: no Has patient had a PCN reaction causing severe rash involving mucus membranes or skin necrosis: no Has patient had a PCN reaction that required hospitalization: no pharmacist consult Has patient had a PCN reaction occurring within the last 10 years: yes If all of the above answers are "NO", then may  proceed with Cephalosporin use.    Ampicillin Rash    - Tolerates Rocephin and Ancef - Remote occurrence; no symptoms of anaphylaxis or severe cutaneous reaction, and no additional medical attention required     Sulfa Antibiotics Rash    FamHx Family History  Problem Relation Age of Onset   Alzheimer's disease Mother    Prostate cancer Father    Brain cancer Brother    Brain cancer Brother    Pancreatic cancer Sister    Allergic rhinitis Neg Hx    Asthma Neg Hx    Eczema Neg Hx    Urticaria Neg Hx     Prior to Admission medications   Medication Sig Start Date End Date Taking? Authorizing Provider  acetaminophen (TYLENOL) 325 MG tablet Take 2 tablets (650 mg total) by mouth every 6 (six) hours as needed for mild pain (or Fever >/= 101). 02/08/16   Johnson, Clanford L, MD  amLODipine (NORVASC) 10 MG tablet Take 1 tablet (10 mg total) by mouth daily. 06/26/21     Ascorbic Acid (VITAMIN C) 1000 MG tablet Take 1,000 mg by mouth daily. Patient not taking: Reported on 07/10/2021    [provider]  azelastine (ASTELIN) 0.1 % nasal spray Place 2 sprays into both nostrils daily as needed for rhinitis. Patient not taking: Reported on 07/10/2021 10/13/20   Modena Jansky, MD  Carboxymethylcellul-Glycerin (LUBRICATING EYE DROPS OP) Place 1 drop into both eyes daily as needed (dry eyes). Patient not taking: Reported on 07/10/2021    [provider]  Cyanocobalamin (VITAMIN B12) 500 MCG TABS Take 500 mcg by mouth daily.    [provider]  diclofenac Sodium (VOLTAREN) 1 % GEL Apply 2 g topically every 12 (twelve) hours to left ankle and right shoulder pain 06/26/21     fexofenadine (ALLEGRA ALLERGY) 180 MG tablet Take 1 tablet (180 mg total) by mouth daily. Patient not taking: Reported on 07/10/2021 04/23/21   Glendale Chard, MD  glucose blood test strip Use as directed to check blood sugars 1 time per day. 03/01/21   Glendale Chard, MD  glucose monitoring kit  (FREESTYLE) monitoring kit Use as directed to check blood sugars 1 time per day dx: e11.22 03/01/21   Glendale Chard, MD  hydrALAZINE (APRESOLINE) 100 MG tablet Take 1 tablet (100 mg total) by mouth every 8 (eight) hours. 06/26/21     insulin aspart (NOVOLOG) 100 UNIT/ML FlexPen Inject into the skin with meals per sliding scale. 151-200= 2 units, 201-250= 4 units, 251-300= 6 units, 301-350=8 units, 351-400=10 units, >401=10 units and notify MD 06/26/21     insulin aspart (NOVOLOG) 100 UNIT/ML injection Inject 0-9 Units into the skin 3 (three) times daily with meals. Patient not taking: Reported on 07/10/2021 06/06/21   Donne Hazel, MD  Lancets (ONETOUCH DELICA PLUS [id number]) MISC Use to check blood sugar once daily 02/07/21  metoprolol tartrate (LOPRESSOR) 25 MG tablet Take 0.5 tablets (12.5 mg total) by mouth 2 (two) times daily. 06/26/21     pantoprazole (PROTONIX) 40 MG tablet Take 1 tablet (40 mg total) by mouth 2 (two) times daily. 06/26/21     prochlorperazine (COMPAZINE) 5 MG tablet Take 1 tablet (5 mg total) by mouth every 6 (six) hours as needed for nausea or vomiting. Patient not taking: Reported on 07/10/2021 05/22/21   Minette Brine, FNP  rosuvastatin (CRESTOR) 20 MG tablet Take 1 tablet (20 mg total) by mouth daily. 04/23/21   Glendale Chard, MD  sucralfate (CARAFATE) 1 GM/10ML suspension Take 10 mLs (1 g total) by mouth 4 (four) times daily -  with meals and at bedtime for 28 days 07/10/21   Glendale Chard, MD  trolamine salicylate (ASPERCREME) 10 % cream Apply 1 application topically daily as needed for muscle pain.    [provider]    Physical Exam: Vitals:   07/12/21 0600 07/12/21 0705 07/12/21 0800 07/12/21 0900  BP: (!) 181/88 (!) 141/88 (!) 155/78 (!) 157/88  Pulse: (!) 102 (!) 102 99 (!) 101  Resp: _0 Temp:      TempSrc:      SpO2: 93% 97% 95% 94%    General: 86 y.o. female resting in bed in NAD Eyes: PERRL, normal sclera ENMT: Nares patent w/o  discharge, orophaynx clear, dentition normal, ears w/o discharge/lesions/ulcers Neck: Supple, trachea midline Cardiovascular: RRR, +S1, S2, no g/r, 3/6 SEM, equal pulses throughout Respiratory: CTABL, no w/r/r, normal WOB GI: BS+, NDNT, no masses noted, no organomegaly noted MSK: No e/c/c Neuro: A&O x 3, ble weakness Psyc: Appropriate interaction and affect, calm/cooperative  Labs on Admission: I have personally reviewed following labs and imaging studies  CBC: Recent Labs  Lab 07/10/21 1157 07/11/21 1007  WBC 14.1* 10.2  NEUTROABS 11.2*  --   HGB 10.9* 11.2*  HCT 32.9* 35.9*  MCV 84 90.2  PLT 529* 482*   Basic Metabolic Panel: Recent Labs  Lab 07/10/21 1157 07/11/21 1007 07/12/21 0042  NA 136 137 134*  K 3.9 3.5 3.3*  CL 107* 111 108  CO2 17* 14* 16*  GLUCOSE 89 93 185*  BUN 26 24* 27*  CREATININE 1.78* 1.86* 1.92*  CALCIUM 9.5 9.4 9.2   GFR: Estimated Creatinine Clearance: 19.5 mL/min (A) (by C-G formula based on SCr of 1.92 mg/dL (H)). Liver Function Tests: Recent Labs  Lab 07/10/21 1157 07/11/21 1007  AST 17 17  ALT 13 15  ALKPHOS 88 68  BILITOT 0.3 0.8  PROT 6.8 7.9  ALBUMIN 4.2 4.1   No results for input(s): LIPASE, AMYLASE in the last 168 hours. No results for input(s): AMMONIA in the last 168 hours. Coagulation Profile: No results for input(s): INR, PROTIME in the last 168 hours. Cardiac Enzymes: Recent Labs  Lab 07/12/21 0042  CKTOTAL 50   BNP (last 3 results) No results for input(s): PROBNP in the last 8760 hours. HbA1C: No results for input(s): HGBA1C in the last 72 hours. CBG: Recent Labs  Lab 07/11/21 2109 07/12/21 0118  GLUCAP 74 174*   Lipid Profile: No results for input(s): CHOL, HDL, LDLCALC, TRIG, CHOLHDL, LDLDIRECT in the last 72 hours. Thyroid Function Tests: No results for input(s): TSH, T4TOTAL, FREET4, T3FREE, THYROIDAB in the last 72 hours. Anemia Panel: No results for input(s): VITAMINB12, FOLATE, FERRITIN, TIBC,  IRON, RETICCTPCT in the last 72 hours. Urine analysis:    Component Value Date/Time  COLORURINE YELLOW 07/12/2021 0424   APPEARANCEUR HAZY (A) 07/12/2021 0424   LABSPEC 1.020 07/12/2021 0424   PHURINE 5.5 07/12/2021 0424   GLUCOSEU NEGATIVE 07/12/2021 0424   HGBUR NEGATIVE 07/12/2021 0424   BILIRUBINUR SMALL (A) 07/12/2021 0424   BILIRUBINUR Negative 11/13/2020 0738   KETONESUR NEGATIVE 07/12/2021 0424   PROTEINUR TRACE (A) 07/12/2021 0424   UROBILINOGEN 0.2 11/13/2020 0738   UROBILINOGEN 0.2 09/01/2014 0300   NITRITE NEGATIVE 07/12/2021 0424   LEUKOCYTESUR TRACE (A) 07/12/2021 0424    Radiological Exams on Admission: CT ABDOMEN PELVIS WO CONTRAST  Result Date: 07/12/2021 CLINICAL DATA:  Acute nonlocalized abdominal pain, nausea, vomiting EXAM: CT ABDOMEN AND PELVIS WITHOUT CONTRAST TECHNIQUE: Multidetector CT imaging of the abdomen and pelvis was performed following the standard protocol without IV contrast. COMPARISON:  None. FINDINGS: Lower chest: Lung bases are clear. Extensive coronary artery calcification. Moderate hiatal hernia. Hepatobiliary: No focal liver abnormality is seen. No gallstones, gallbladder wall thickening, or biliary dilatation. Pancreas: Unremarkable Spleen: Unremarkable Adrenals/Urinary Tract: The adrenal glands are unremarkable. The kidneys are normal in size and position. A macroscopic fat containing heterogeneous mass arises from the upper pole the left kidney measuring 1.8 x 2.9 x 3.1 cm compatible with a renal angiomyolipoma. This is unchanged from immediate prior examination and has significantly decreased in size since remote prior examination of 09/01/2014 in keeping with interval embolization on 04/10/2016. The kidneys are otherwise unremarkable. Moderate gas is seen within the bladder lumen. The bladder is otherwise unremarkable and no perivesicular inflammatory changes are identified. Stomach/Bowel: Moderate sigmoid diverticulosis without superimposed  inflammatory change. The stomach, large bowel, and small bowel are otherwise unremarkable. Appendix normal. No free intraperitoneal gas or fluid. Vascular/Lymphatic: Aortic atherosclerosis. No enlarged abdominal or pelvic lymph nodes. Reproductive: Status post hysterectomy. No adnexal masses. Other: Tiny bilateral fat containing inguinal hernias. Musculoskeletal: Degenerative changes are seen within the lumbar spine. No acute bone abnormality. No lytic or blastic bone lesion. IMPRESSION: No acute intra-abdominal pathology identified. No definite radiographic explanation for the patient's reported symptoms. Moderate gas within the bladder lumen. Correlation for recent catheterization or cystoscopic procedure is recommended. Alternative considerations such as aggressive infection or enteric fistula are considered less likely given the lack of surrounding inflammatory change. Extensive coronary artery calcification. Moderate hiatal hernia. 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 Moderate sigmoid diverticulosis. Aortic Atherosclerosis (ICD10-I70.0). Electronically Signed   By: Fidela Salisbury M.D.   On: 07/12/2021 01:58   CT Head Wo Contrast  Result Date: 07/11/2021 CLINICAL DATA:  Headache, emesis, anemia EXAM: CT HEAD WITHOUT CONTRAST TECHNIQUE: Contiguous axial images were obtained from the base of the skull through the vertex without intravenous contrast. COMPARISON:  05/26/2021 FINDINGS: Brain: Stable atrophy pattern and extensive white matter microvascular ischemic changes throughout both cerebral hemispheres. Remote basal ganglia infarcts. Small remote left cerebellar infarct. No acute intracranial hemorrhage, new mass lesion, new infarction, midline shift, herniation, hydrocephalus, or extra-axial fluid collection. Cisterns are patent. No cerebellar abnormality. Vascular: Intracranial atherosclerosis at the skull  base. No hyperdense vessel. Skull: Normal. Negative for fracture or focal lesion. Sinuses/Orbits: No acute finding. Other: None. IMPRESSION: Stable atrophy and extensive white matter microvascular ischemic changes. No acute intracranial abnormality or significant change by noncontrast CT. Electronically Signed   By: Jerilynn Mages.  Shick M.D.   On: 07/11/2021 13:55   MR BRAIN WO CONTRAST  Result Date: 07/12/2021 CLINICAL DATA:  86 year old female with dizziness. Headache, vomiting, anemia. EXAM: MRI  HEAD WITHOUT CONTRAST TECHNIQUE: Multiplanar, multiecho pulse sequences of the brain and surrounding structures were obtained without intravenous contrast. COMPARISON:  Head CT 07/11/2021.  Brain MRI 05/27/2021. FINDINGS: Brain: No restricted diffusion or evidence of acute infarction. A expected evolution of the punctate abnormal diffusion foci previously seen in the right centrum semiovale and near the splenium of the corpus callosum and October. Chronic severe background T2 and FLAIR heterogeneity in the bilateral cerebral white matter, and bilateral deep gray matter nuclei (especially the right thalamus with volume loss). Patchy multifocal chronic small-vessel ischemia also in the pons and occasional small chronic infarcts in the bilateral cerebellum. No definite cortical encephalomalacia, although there are fairly numerous chronic microhemorrhages in the bilateral deep gray nuclei, occasionally scattered in the cerebral white matter, brainstem and cerebellum which appears stable. No midline shift, mass effect, evidence of mass lesion, ventriculomegaly, extra-axial collection or acute intracranial hemorrhage. Cervicomedullary junction and pituitary are within normal limits. Vascular: Major intracranial vascular flow voids are stable with generalized intracranial artery tortuosity. Particularly ectatic posterior circulation redemonstrated with associated mild mass effect on the ventral brainstem. Skull and upper cervical  spine: Multilevel degenerative appearing spondylolisthesis in the cervical spine with advanced disc and endplate degeneration. Up to mild associated spinal stenosis. Visualized bone marrow signal is within normal limits. Sinuses/Orbits: Orbits appears stable, negative. Decreased but not resolved fluid and mucosal thickening in the left sphenoid sinus. Ongoing mucosal thickening at the right frontoethmoidal recess. Other paranasal sinuses and mastoids are stable and well aerated. Other: Grossly normal visible internal auditory structures. Negative visible scalp and face. IMPRESSION: 1. No acute intracranial abnormality. 2. Severe chronic small vessel disease, without progression since the October MRI. Severe involvement of the deep gray nuclei with clustered chronic micro-emorrhages associated. Electronically Signed   By: Genevie Ann M.D.   On: 07/12/2021 07:19   DG Chest Portable 1 View  Result Date: 07/12/2021 CLINICAL DATA:  Weakness, vomiting. EXAM: PORTABLE CHEST 1 VIEW COMPARISON:  None. FINDINGS: The heart is normal in size. Atherosclerotic calcification of the aorta is noted. The lungs are clear without effusions or infiltrates. No pneumothorax is seen. No acute osseous abnormality. IMPRESSION: No acute cardiopulmonary process. Electronically Signed   By: Brett Fairy M.D.   On: 07/12/2021 01:41    EKG: None obtained in ED  Assessment/Plan UTI     - admit to inpt, tele     - continue rocephin     - follow Ucx  Normal Gap Metabolic acidosis     - lactic acid ok     - continue fluids, bicarb     - likely from some dehydration and baseline kidney disease  Generalized weakness     - likely from above and poor PO intake     - PT consult  N/V/D Poor PO intake     - no V/D for last couple of days; but remains nauseous     - likely d/t UTI     - anti-emetics     - start on CLD and advance as tolerated  AKI on CKD3a     - fluids, check renal US  Hypokalemia     - replace K+; check  Mg2+  Elevated troponin     - check EKG; denies CP     - trp flat: 43 -> 40; likely demand ischemia  DM2     - A1c, SSI, CLD, glucose checks  HTN      - resume home regimen  HLD      -  continue home statin  DVT prophylaxis: SCDs  Code Status: FULL  Family Communication: spoke w/ son by phone  Consults called: None   Status is: Inpatient  Remains inpatient appropriate because: severity of illness  Aubert Choyce Gu[place] DO [facility]  If 7PM-7AM, please contact night-coverage [email or web address]  07/12/2021, 9:18 AM

## 2021-07-12 NOTE — ED Notes (Signed)
[facility]G cannot export at this time but you are able to print.

## 2021-07-12 NOTE — ED Notes (Signed)
Pt is back from mri.

## 2021-07-12 NOTE — ED Notes (Signed)
Pt can not stand is too dizzy

## 2021-07-12 NOTE — ED Notes (Signed)
Orthostatic vital signs can't be obtained. The patient is unable to stand.

## 2021-07-13 ENCOUNTER — Encounter (HOSPITAL_COMMUNITY): Payer: Self-pay | Admitting: Internal Medicine

## 2021-07-13 DIAGNOSIS — R531 Weakness: Secondary | ICD-10-CM

## 2021-07-13 LAB — COMPREHENSIVE METABOLIC PANEL
ALT: 13 U/L (ref 0–44)
AST: 16 U/L (ref 15–41)
Albumin: 3.1 g/dL — ABNORMAL LOW (ref 3.5–5.0)
Alkaline Phosphatase: 51 U/L (ref 38–126)
Anion gap: 10 (ref 5–15)
BUN: 17 mg/dL (ref 8–23)
CO2: 19 mmol/L — ABNORMAL LOW (ref 22–32)
Calcium: 8.7 mg/dL — ABNORMAL LOW (ref 8.9–10.3)
Chloride: 107 mmol/L (ref 98–111)
Creatinine, Ser: 1.43 mg/dL — ABNORMAL HIGH (ref 0.44–1.00)
GFR, Estimated: 36 mL/min — ABNORMAL LOW (ref >60)
Glucose, Bld: 82 mg/dL (ref 70–99)
Potassium: 3.6 mmol/L (ref 3.5–5.1)
Sodium: 136 mmol/L (ref 135–145)
Total Bilirubin: 0.6 mg/dL (ref 0.3–1.2)
Total Protein: 6.3 g/dL — ABNORMAL LOW (ref 6.5–8.1)

## 2021-07-13 LAB — CBC
HCT: 31 % — ABNORMAL LOW (ref 36.0–46.0)
Hemoglobin: 9.9 g/dL — ABNORMAL LOW (ref 12.0–15.0)
MCH: 28.2 pg (ref 26.0–34.0)
MCHC: 31.9 g/dL (ref 30.0–36.0)
MCV: 88.3 fL (ref 80.0–100.0)
Platelets: 407 10*3/uL — ABNORMAL HIGH (ref 150–400)
RBC: 3.51 MIL/uL — ABNORMAL LOW (ref 3.87–5.11)
RDW: 15.2 % (ref 11.5–15.5)
WBC: 6.6 10*3/uL (ref 4.0–10.5)
nRBC: 0 % (ref 0.0–0.2)

## 2021-07-13 LAB — VITAMIN D 25 HYDROXY (VIT D DEFICIENCY, FRACTURES): Vit D, 25-Hydroxy: 20.47 ng/mL — ABNORMAL LOW (ref 30–100)

## 2021-07-13 LAB — TSH: TSH: 1.098 u[IU]/mL (ref 0.350–4.500)

## 2021-07-13 LAB — GLUCOSE, CAPILLARY
Glucose-Capillary: 142 mg/dL — ABNORMAL HIGH (ref 70–99)
Glucose-Capillary: 116 mg/dL — ABNORMAL HIGH (ref 70–99)
Glucose-Capillary: 105 mg/dL — ABNORMAL HIGH (ref 70–99)

## 2021-07-13 LAB — HEMOGLOBIN A1C
Hgb A1c MFr Bld: 5.8 % — ABNORMAL HIGH (ref 4.8–5.6)
Mean Plasma Glucose: 119.76 mg/dL

## 2021-07-13 MED ORDER — ROSUVASTATIN CALCIUM 20 MG PO TABS
20.0000 mg | ORAL_TABLET | Freq: Every day | ORAL | Status: DC
  Administered 2021-07-13 – 2021-07-15 (×3): 20 mg via ORAL
  Filled 2021-07-13 (×3): qty 1

## 2021-07-13 MED ORDER — TRAZODONE HCL 50 MG PO TABS: 50.0000 mg | ORAL_TABLET | Freq: Every evening | ORAL | Status: DC | PRN

## 2021-07-13 MED ORDER — GUAIFENESIN 100 MG/5ML PO LIQD
5.0000 mL | ORAL | Status: DC | PRN
  Administered 2021-07-13: 5 mL via ORAL
  Filled 2021-07-13: qty 10

## 2021-07-13 MED ORDER — AMLODIPINE BESYLATE 10 MG PO TABS
10.0000 mg | ORAL_TABLET | Freq: Every day | ORAL | Status: DC
  Administered 2021-07-13 – 2021-07-15 (×3): 10 mg via ORAL
  Filled 2021-07-13 (×3): qty 1

## 2021-07-13 MED ORDER — SENNOSIDES-DOCUSATE SODIUM 8.6-50 MG PO TABS: 1.0000 | ORAL_TABLET | Freq: Every evening | ORAL | Status: DC | PRN

## 2021-07-13 MED ORDER — HYDRALAZINE HCL 20 MG/ML IJ SOLN: 10.0000 mg | INTRAMUSCULAR | Status: DC | PRN

## 2021-07-13 MED ORDER — OXYCODONE HCL 5 MG PO TABS: 5.0000 mg | ORAL_TABLET | ORAL | Status: DC | PRN

## 2021-07-13 MED ORDER — CHLORHEXIDINE GLUCONATE 0.12 % MT SOLN
15.0000 mL | Freq: Two times a day (BID) | OROMUCOSAL | Status: DC
  Administered 2021-07-13 – 2021-07-15 (×5): 15 mL via OROMUCOSAL
  Filled 2021-07-13 (×5): qty 15

## 2021-07-13 MED ORDER — INSULIN ASPART 100 UNIT/ML IJ SOLN
0.0000 [IU] | Freq: Three times a day (TID) | INTRAMUSCULAR | Status: DC
Start: 2021-07-13 — End: 2021-07-15
  Administered 2021-07-13: 2 [IU] via SUBCUTANEOUS

## 2021-07-13 MED ORDER — HYDRALAZINE HCL 50 MG PO TABS
100.0000 mg | ORAL_TABLET | Freq: Three times a day (TID) | ORAL | Status: DC
  Administered 2021-07-13 – 2021-07-15 (×8): 100 mg via ORAL
  Filled 2021-07-13 (×8): qty 2

## 2021-07-13 MED ORDER — ASPIRIN EC 81 MG PO TBEC
81.0000 mg | DELAYED_RELEASE_TABLET | Freq: Every day | ORAL | Status: DC
  Administered 2021-07-13 – 2021-07-15 (×3): 81 mg via ORAL
  Filled 2021-07-13 (×3): qty 1

## 2021-07-13 MED ORDER — ORAL CARE MOUTH RINSE
15.0000 mL | Freq: Two times a day (BID) | OROMUCOSAL | Status: DC
  Administered 2021-07-13: 15 mL via OROMUCOSAL

## 2021-07-13 MED ORDER — METOPROLOL TARTRATE 25 MG PO TABS
12.5000 mg | ORAL_TABLET | Freq: Two times a day (BID) | ORAL | Status: DC
  Administered 2021-07-13 – 2021-07-15 (×5): 12.5 mg via ORAL
  Filled 2021-07-13 (×5): qty 1

## 2021-07-13 MED ORDER — SODIUM CHLORIDE 0.9 % IV SOLN: INTRAVENOUS | Status: AC

## 2021-07-13 MED ORDER — INSULIN ASPART 100 UNIT/ML IJ SOLN
0.0000 [IU] | Freq: Every day | INTRAMUSCULAR | Status: DC
Start: 2021-07-13 — End: 2021-07-15

## 2021-07-13 MED ORDER — SUCRALFATE 1 GM/10ML PO SUSP
1.0000 g | Freq: Three times a day (TID) | ORAL | Status: DC
  Administered 2021-07-13 – 2021-07-15 (×7): 1 g via ORAL
  Filled 2021-07-13 (×7): qty 10

## 2021-07-13 MED ORDER — METOPROLOL TARTRATE 5 MG/5ML IV SOLN: 5.0000 mg | INTRAVENOUS | Status: DC | PRN

## 2021-07-13 MED ORDER — PANTOPRAZOLE SODIUM 40 MG PO TBEC
40.0000 mg | DELAYED_RELEASE_TABLET | Freq: Two times a day (BID) | ORAL | Status: DC
  Administered 2021-07-13 – 2021-07-15 (×5): 40 mg via ORAL
  Filled 2021-07-13 (×5): qty 1

## 2021-07-13 NOTE — Plan of Care (Signed)

## 2021-07-13 NOTE — Progress Notes (Signed)
PROGRESS NOTE    Anna Hoffman  RWE:[id number] DOB: 08-Nov-1934 DOA: 07/11/2021 PCP: Glendale Chard, MD   Brief Narrative:  86 year old with history of DM2, hocm, CKD stage III, HTN presenting with weakness and poor oral intake.  Patient was in the hospital recently for acute CVA and altered mental status complicated by AKI.  Eventually went to rehab.  She was brought back from home due to generalized weakness and poor oral intake for the past couple of days.  Suspicion for UTI and dehydration started on IV Rocephin.   Assessment & Plan:   Principal Problem:   Generalized weakness   Generalized weakness and poor oral intake Metabolic encephalopathy due to urinary tract infection None anion gap metabolic acidosis from dehydration - Follow-up culture data.  Empiric IV Rocephin.  Continue IV fluids.  Encephalopathy has resolved. - PT/OT - Check TSH - Continue bicarb p.o. for at least next 24 hours -CT abdomen pelvis-shows some chronic changes but no acute findings.  CT head negative.  MRI brain shows chronic changes  Acute kidney injury on CKD stage III yea -Admission creatinine 1.9.  Baseline close to 1.3.  Hypokalemia - Replete electrolytes as necessary  Diabetes mellitus type 2 -Sliding scale and Accu-Chek.  Recent A1c 6.2  Essential hypertension -Norvasc, metoprolol.  Hydralazine on hold.  IV as needed hydralazine and Lopressor ordered  Hyperlipidemia -Statin  Recent CVA November 2022 - On statin.  A1c 6.2, LDL 47.  Previous admission notes patient was supposed to be on aspirin but I did not see this on discharge paperwork and on preadmission medication this visit.  Have consulted pharmacy to confirm  History of HOCM - Supportive care.    DVT prophylaxis: SCDs Start: 07/12/21 1922 Code Status: Full code Family Communication:  Son Updated.   Status is: Inpatient  Remains inpatient appropriate because: Maintain hospital stay due to weakness, poor oral intake.  IV  fluids, IV antibiotics.   Subjective: Still has poor oral intake, feels weak overall.    Examination: Constitutional: Not in acute distress. Elderly grail weak.  Respiratory: Clear to auscultation bilaterally Cardiovascular: Normal sinus rhythm, no rubs Abdomen: Nontender nondistended good bowel sounds Musculoskeletal: No edema noted Skin: No rashes seen Neurologic: CN 2-12 grossly intact.  And nonfocal Psychiatric: Normal judgment and insight. Alert and oriented x 3. Normal mood.    Objective: Vitals:   07/12/21 1900 07/12/21 2228 07/13/21 0232 07/13/21 0631  BP: (!) 152/80 (!) 159/84 (!) 169/85 (!) 153/85  Pulse: 99 100 86 93  Resp: 17 (!) 26 20 20   Temp:   97.7 F (36.5 C) 98.1 F (36.7 C)  TempSrc:   Oral Oral  SpO2: 100% 94% 98% 92%  Weight:   55.7 kg   Height:   5' 5.5" (1.664 m)     Intake/Output Summary (Last 24 hours) at 07/13/2021 0729 Last data filed at 07/13/2021 0032 Gross per 24 hour  Intake 1000 ml  Output --  Net 1000 ml   Filed Weights   07/13/21 0232  Weight: 55.7 kg     Data Reviewed:   CBC: Recent Labs  Lab 07/10/21 1157 07/11/21 1007 07/13/21 0417  WBC 14.1* 10.2 6.6  NEUTROABS 11.2*  --   --   HGB 10.9* 11.2* 9.9*  HCT 32.9* 35.9* 31.0*  MCV 84 90.2 88.3  PLT 529* 475* 619*   Basic Metabolic Panel: Recent Labs  Lab 07/10/21 1157 07/11/21 1007 07/12/21 0042 07/12/21 2101 07/13/21 0417  NA 136 137 134*  --  136  K 3.9 3.5 3.3*  --  3.6  CL 107* 111 108  --  107  CO2 17* 14* 16*  --  19*  GLUCOSE 89 93 185*  --  82  BUN 26 24* 27*  --  17  CREATININE 1.78* 1.86* 1.92*  --  1.43*  CALCIUM 9.5 9.4 9.2  --  8.7*  MG  --   --   --  2.1  --    GFR: Estimated Creatinine Clearance: 25.8 mL/min (A) (by C-G formula based on SCr of 1.43 mg/dL (H)). Liver Function Tests: Recent Labs  Lab 07/10/21 1157 07/11/21 1007 07/13/21 0417  AST 17 17 16   ALT 13 15 13   ALKPHOS 88 68 51  BILITOT 0.3 0.8 0.6  PROT 6.8 7.9 6.3*   ALBUMIN 4.2 4.1 3.1*   No results for input(s): LIPASE, AMYLASE in the last 168 hours. No results for input(s): AMMONIA in the last 168 hours. Coagulation Profile: No results for input(s): INR, PROTIME in the last 168 hours. Cardiac Enzymes: Recent Labs  Lab 07/12/21 0042  CKTOTAL 50   BNP (last 3 results) No results for input(s): PROBNP in the last 8760 hours. HbA1C: Recent Labs    07/12/21 2101  HGBA1C 5.8*   CBG: Recent Labs  Lab 07/11/21 2109 07/12/21 0118 07/12/21 1934 07/12/21 2233  GLUCAP 74 174* 86 89   Lipid Profile: No results for input(s): CHOL, HDL, LDLCALC, TRIG, CHOLHDL, LDLDIRECT in the last 72 hours. Thyroid Function Tests: No results for input(s): TSH, T4TOTAL, FREET4, T3FREE, THYROIDAB in the last 72 hours. Anemia Panel: No results for input(s): VITAMINB12, FOLATE, FERRITIN, TIBC, IRON, RETICCTPCT in the last 72 hours. Sepsis Labs: Recent Labs  Lab 07/12/21 0120 07/12/21 0242  LATICACIDVEN 1.1 0.8    Recent Results (from the past 240 hour(s))  Resp Panel by RT-PCR (Flu A&B, Covid) Nasopharyngeal Swab     Status: None   Collection Time: 07/11/21 11:51 AM   Specimen: Nasopharyngeal Swab; Nasopharyngeal(NP) swabs in vial transport medium  Result Value Ref Range Status   SARS Coronavirus 2 by RT PCR NEGATIVE NEGATIVE Final    Comment: (NOTE) SARS-CoV-2 target nucleic acids are NOT DETECTED.  The SARS-CoV-2 RNA is generally detectable in upper respiratory specimens during the acute phase of infection. The lowest concentration of SARS-CoV-2 viral copies this assay can detect is 138 copies/mL. A negative result does not preclude SARS-Cov-2 infection and should not be used as the sole basis for treatment or other patient management decisions. A negative result may occur with  improper specimen collection/handling, submission of specimen other than nasopharyngeal swab, presence of viral mutation(s) within the areas targeted by this assay, and  inadequate number of viral copies(<138 copies/mL). A negative result must be combined with clinical observations, patient history, and epidemiological information. The expected result is Negative.  Fact Sheet for Patients:  EntrepreneurPulse.com.au  Fact Sheet for Healthcare Providers:  IncredibleEmployment.be  This test is no t yet approved or cleared by the [place] FDA and  has been authorized for detection and/or diagnosis of SARS-CoV-2 by FDA under an Emergency Use Authorization (EUA). This EUA will remain  in effect (meaning this test can be used) for the duration of the COVID-19 declaration under Section 564(b)(1) of the Act, 21 U.S.C.section 360bbb-3(b)(1), unless the authorization is terminated  or revoked sooner.       Influenza A by PCR NEGATIVE NEGATIVE Final   Influenza B by PCR NEGATIVE NEGATIVE Final    Comment: (  NOTE) The Xpert Xpress SARS-CoV-2/FLU/RSV plus assay is intended as an aid in the diagnosis of influenza from Nasopharyngeal swab specimens and should not be used as a sole basis for treatment. Nasal washings and aspirates are unacceptable for Xpert Xpress SARS-CoV-2/FLU/RSV testing.  Fact Sheet for Patients: EntrepreneurPulse.com.au  Fact Sheet for Healthcare Providers: IncredibleEmployment.be  This test is not yet approved or cleared by the [place] FDA and has been authorized for detection and/or diagnosis of SARS-CoV-2 by FDA under an Emergency Use Authorization (EUA). This EUA will remain in effect (meaning this test can be used) for the duration of the COVID-19 declaration under Section 564(b)(1) of the Act, 21 U.S.C. section 360bbb-3(b)(1), unless the authorization is terminated or revoked.  Performed at [facility], [place] [address] [place], [place] [address]          Radiology Studies: CT ABDOMEN PELVIS WO CONTRAST  Result Date:  07/12/2021 CLINICAL DATA:  Acute nonlocalized abdominal pain, nausea, vomiting EXAM: CT ABDOMEN AND PELVIS WITHOUT CONTRAST TECHNIQUE: Multidetector CT imaging of the abdomen and pelvis was performed following the standard protocol without IV contrast. COMPARISON:  None. FINDINGS: Lower chest: Lung bases are clear. Extensive coronary artery calcification. Moderate hiatal hernia. Hepatobiliary: No focal liver abnormality is seen. No gallstones, gallbladder wall thickening, or biliary dilatation. Pancreas: Unremarkable Spleen: Unremarkable Adrenals/Urinary Tract: The adrenal glands are unremarkable. The kidneys are normal in size and position. A macroscopic fat containing heterogeneous mass arises from the upper pole the left kidney measuring 1.8 x 2.9 x 3.1 cm compatible with a renal angiomyolipoma. This is unchanged from immediate prior examination and has significantly decreased in size since remote prior examination of 09/01/2014 in keeping with interval embolization on 04/10/2016. The kidneys are otherwise unremarkable. Moderate gas is seen within the bladder lumen. The bladder is otherwise unremarkable and no perivesicular inflammatory changes are identified. Stomach/Bowel: Moderate sigmoid diverticulosis without superimposed inflammatory change. The stomach, large bowel, and small bowel are otherwise unremarkable. Appendix normal. No free intraperitoneal gas or fluid. Vascular/Lymphatic: Aortic atherosclerosis. No enlarged abdominal or pelvic lymph nodes. Reproductive: Status post hysterectomy. No adnexal masses. Other: Tiny bilateral fat containing inguinal hernias. Musculoskeletal: Degenerative changes are seen within the lumbar spine. No acute bone abnormality. No lytic or blastic bone lesion. IMPRESSION: No acute intra-abdominal pathology identified. No definite radiographic explanation for the patient's reported symptoms. Moderate gas within the bladder lumen. Correlation for recent catheterization or  cystoscopic procedure is recommended. Alternative considerations such as aggressive infection or enteric fistula are considered less likely given the lack of surrounding inflammatory change. Extensive coronary artery calcification. Moderate hiatal hernia. 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 Moderate sigmoid diverticulosis. Aortic Atherosclerosis (ICD10-I70.0). Electronically Signed   By: Fidela Salisbury M.D.   On: 07/12/2021 01:58   CT Head Wo Contrast  Result Date: 07/11/2021 CLINICAL DATA:  Headache, emesis, anemia EXAM: CT HEAD WITHOUT CONTRAST TECHNIQUE: Contiguous axial images were obtained from the base of the skull through the vertex without intravenous contrast. COMPARISON:  05/26/2021 FINDINGS: Brain: Stable atrophy pattern and extensive white matter microvascular ischemic changes throughout both cerebral hemispheres. Remote basal ganglia infarcts. Small remote left cerebellar infarct. No acute intracranial hemorrhage, new mass lesion, new infarction, midline shift, herniation, hydrocephalus, or extra-axial fluid collection. Cisterns are patent. No cerebellar abnormality. Vascular: Intracranial atherosclerosis at the skull base. No hyperdense vessel. Skull: Normal. Negative for fracture or focal lesion. Sinuses/Orbits: No acute finding. Other: None.  IMPRESSION: Stable atrophy and extensive white matter microvascular ischemic changes. No acute intracranial abnormality or significant change by noncontrast CT. Electronically Signed   By: Jerilynn Mages.  Shick M.D.   On: 07/11/2021 13:55   MR BRAIN WO CONTRAST  Result Date: 07/12/2021 CLINICAL DATA:  86 year old female with dizziness. Headache, vomiting, anemia. EXAM: MRI HEAD WITHOUT CONTRAST TECHNIQUE: Multiplanar, multiecho pulse sequences of the brain and surrounding structures were obtained without intravenous contrast. COMPARISON:  Head CT 07/11/2021.  Brain MRI  05/27/2021. FINDINGS: Brain: No restricted diffusion or evidence of acute infarction. A expected evolution of the punctate abnormal diffusion foci previously seen in the right centrum semiovale and near the splenium of the corpus callosum and October. Chronic severe background T2 and FLAIR heterogeneity in the bilateral cerebral white matter, and bilateral deep gray matter nuclei (especially the right thalamus with volume loss). Patchy multifocal chronic small-vessel ischemia also in the pons and occasional small chronic infarcts in the bilateral cerebellum. No definite cortical encephalomalacia, although there are fairly numerous chronic microhemorrhages in the bilateral deep gray nuclei, occasionally scattered in the cerebral white matter, brainstem and cerebellum which appears stable. No midline shift, mass effect, evidence of mass lesion, ventriculomegaly, extra-axial collection or acute intracranial hemorrhage. Cervicomedullary junction and pituitary are within normal limits. Vascular: Major intracranial vascular flow voids are stable with generalized intracranial artery tortuosity. Particularly ectatic posterior circulation redemonstrated with associated mild mass effect on the ventral brainstem. Skull and upper cervical spine: Multilevel degenerative appearing spondylolisthesis in the cervical spine with advanced disc and endplate degeneration. Up to mild associated spinal stenosis. Visualized bone marrow signal is within normal limits. Sinuses/Orbits: Orbits appears stable, negative. Decreased but not resolved fluid and mucosal thickening in the left sphenoid sinus. Ongoing mucosal thickening at the right frontoethmoidal recess. Other paranasal sinuses and mastoids are stable and well aerated. Other: Grossly normal visible internal auditory structures. Negative visible scalp and face. IMPRESSION: 1. No acute intracranial abnormality. 2. Severe chronic small vessel disease, without progression since the  October MRI. Severe involvement of the deep gray nuclei with clustered chronic micro-emorrhages associated. Electronically Signed   By: Genevie Ann M.D.   On: 07/12/2021 07:19   US RENAL  Result Date: 07/12/2021 CLINICAL DATA:  Acute kidney injury EXAM: RENAL / URINARY TRACT ULTRASOUND COMPLETE COMPARISON:  05/28/2021.  CT earlier today FINDINGS: Right Kidney: Renal measurements: 9.0 x 4.4 x 4.5 cm = volume: 91 mL. 1.7 cm upper pole cyst. Normal echotexture. No hydronephrosis. Left Kidney: Renal measurements: 8.1 x 4.0 x 4.8 cm = volume: 82 mL. 2.3 cm lower pole cyst. 3.5 cm echogenic solid mass in the upper pole. This is most compatible with angiomyolipoma. No hydronephrosis. Bladder: Debris floating within the bladder. Other: None. IMPRESSION: No acute findings.  No hydronephrosis. 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 Bilateral renal cysts. Electronically Signed   By: Rolm Baptise M.D.   On: 07/12/2021 20:24   DG Chest Portable 1 View  Result Date: 07/12/2021 CLINICAL DATA:  Weakness, vomiting. EXAM: PORTABLE CHEST 1 VIEW COMPARISON:  None. FINDINGS: The heart is normal in size. Atherosclerotic calcification of the aorta is noted. The lungs are clear without effusions or infiltrates. No pneumothorax is seen. No acute osseous abnormality. IMPRESSION: No acute cardiopulmonary process. Electronically Signed   By: Brett Fairy M.D.   On: 07/12/2021 01:41        Scheduled Meds:  chlorhexidine  15 mL Mouth Rinse BID   insulin aspart  0-6 Units Subcutaneous TID WC   mouth rinse  15 mL Mouth Rinse q12n4p   potassium chloride  40 mEq Oral Daily   sodium bicarbonate  650 mg Oral BID   Continuous Infusions:  cefTRIAXone (ROCEPHIN)  IV       LOS: 1 day   Time spent= 35 mins    Lakechia Nay Arsenio Loader, MD [facility]  If 7PM-7AM, please contact night-coverage  07/13/2021, 7:29 AM

## 2021-07-13 NOTE — Evaluation (Signed)
Physical Therapy Evaluation Patient Details Name: Anna Hoffman MRN: [id number] DOB: Jul 12, 1935 Today's Date: 07/13/2021  History of Present Illness  86 y.o. female presenting to ED with N/V and weakness x 2-3 days with mutliple falls resulting in trauma to R shoulder (followed by outpatient PT). Patient found to have SIRS. PMH significant for esophagitis, CKD stage IIIa, hypertension, colitis, hypertrophic cardiomyopathy and intracerebral hemorrhage, diabetes.  Clinical Impression  Pt admitted with above diagnosis.  Pt states she does not want to return to rehab. Reports her son is going to hire additional assist. Pt has been in bed for 2 days, has not been OOB per her report, using purewick, now weaker than her baseline at d/c from rehab.  PT will benefit from PT in acute setting, will work toward her goals of home with HHPT. Will likely need near 24 hour assist initially, pt is in agreement with this as well.  Overall min assist for transfers today, feels too weak to ambulate. Encouraged OOB and use of BSC rather than the purewick (at least during the day).   Pt currently with functional limitations due to the deficits listed below (see PT Problem List). Pt will benefit from skilled PT to increase their independence and safety with mobility to allow discharge to the venue listed below.          Recommendations for follow up therapy are one component of a multi-disciplinary discharge planning process, led by the attending physician.  Recommendations may be updated based on patient status, additional functional criteria and insurance authorization.  Follow Up Recommendations Home health PT    Assistance Recommended at Discharge Frequent or constant Supervision/Assistance  Functional Status Assessment    Equipment Recommendations  None recommended by PT    Recommendations for Other Services       Precautions / Restrictions Precautions Precautions: Fall Restrictions Weight Bearing  Restrictions: No      Mobility  Bed Mobility Overal bed mobility: Needs Assistance Bed Mobility: Supine to Sit     Supine to sit: Min assist     General bed mobility comments: assist to elevate trunk    Transfers Overall transfer level: Needs assistance Equipment used: Rolling walker (2 wheels) Transfers: Sit to/from Stand;Bed to chair/wheelchair/BSC Sit to Stand: Min assist   Step pivot transfers: Min assist       General transfer comment: cues for hand placement. assist to rise and control descent; min assist to steady for stand step pivot x2    Ambulation/Gait               General Gait Details: pivotal steps, steps fwd and back to chair  Stairs            Wheelchair Mobility    Modified Rankin (Stroke Patients Only)       Balance Overall balance assessment: Needs assistance Sitting-balance support: No upper extremity supported;Feet supported Sitting balance-Leahy Scale: Fair     Standing balance support: Reliant on assistive device for balance;During functional activity Standing balance-Leahy Scale: Poor                               Pertinent Vitals/Pain Pain Assessment: No/denies pain    Home Living Family/patient expects to be discharged to:: Private residence Living Arrangements: Children (son) Available Help at Discharge: Family;Available PRN/intermittently Type of Home: House Home Access: Stairs to enter Entrance Stairs-Rails: Right;Left;Can reach both Entrance Stairs-Number of Steps: 2-3   Home Layout:  One level Home Equipment: Conservation officer, nature (2 wheels);BSC/3in1 Additional Comments: pt reports her son is going to hire assist or get her aide (she says he works for [employer])    Prior Sport and exercise psychologist        Extremity/Trunk Assessment                Communication      Cognition Arousal/Alertness: Awake/alert Behavior During Therapy: WFL for tasks  assessed/performed Overall Cognitive Status: Within Functional Limits for tasks assessed                                 General Comments: slighlty tangential at times however A and O x3, aware of situation and timeline of events        General Comments      Exercises     Assessment/Plan    PT Assessment    PT Problem List         PT Treatment Interventions      PT Goals (Current goals can be found in the Care Plan section)  Acute Rehab PT Goals Patient Stated Goal: home and not to rehab PT Goal Formulation: With patient Time For Goal Achievement: 07/27/21 Potential to Achieve Goals: Good    Frequency Min 3X/week   Barriers to discharge        Co-evaluation               AM-PAC PT "6 Clicks" Mobility  Outcome Measure                  End of Session Equipment Utilized During Treatment: Gait belt Activity Tolerance: Patient limited by fatigue Patient left: in chair;with call bell/phone within reach;with chair alarm set Nurse Communication: Mobility status PT Visit Diagnosis: Unsteadiness on feet (R26.81);Muscle weakness (generalized) (M62.81);History of falling (Z91.81)    Time: 8185-6314 PT Time Calculation (min) (ACUTE ONLY): 22 min   Charges:   PT Evaluation $PT Eval Low Complexity: [place], PT  Acute Rehab Dept ([place]) [phone] Pager [phone]  07/13/2021   [facility] 07/13/2021, 12:42 PM

## 2021-07-14 LAB — BASIC METABOLIC PANEL
Anion gap: 7 (ref 5–15)
BUN: 13 mg/dL (ref 8–23)
CO2: 19 mmol/L — ABNORMAL LOW (ref 22–32)
Calcium: 8.4 mg/dL — ABNORMAL LOW (ref 8.9–10.3)
Chloride: 110 mmol/L (ref 98–111)
Creatinine, Ser: 1.33 mg/dL — ABNORMAL HIGH (ref 0.44–1.00)
GFR, Estimated: 39 mL/min — ABNORMAL LOW (ref >60)
Glucose, Bld: 95 mg/dL (ref 70–99)
Potassium: 3.9 mmol/L (ref 3.5–5.1)
Sodium: 136 mmol/L (ref 135–145)

## 2021-07-14 LAB — CBC
HCT: 30 % — ABNORMAL LOW (ref 36.0–46.0)
Hemoglobin: 9.5 g/dL — ABNORMAL LOW (ref 12.0–15.0)
MCH: 27.9 pg (ref 26.0–34.0)
MCHC: 31.7 g/dL (ref 30.0–36.0)
MCV: 88.2 fL (ref 80.0–100.0)
Platelets: 421 10*3/uL — ABNORMAL HIGH (ref 150–400)
RBC: 3.4 MIL/uL — ABNORMAL LOW (ref 3.87–5.11)
RDW: 15.2 % (ref 11.5–15.5)
WBC: 6.4 10*3/uL (ref 4.0–10.5)
nRBC: 0 % (ref 0.0–0.2)

## 2021-07-14 LAB — GLUCOSE, CAPILLARY
Glucose-Capillary: 89 mg/dL (ref 70–99)
Glucose-Capillary: 108 mg/dL — ABNORMAL HIGH (ref 70–99)
Glucose-Capillary: 161 mg/dL — ABNORMAL HIGH (ref 70–99)

## 2021-07-14 LAB — MAGNESIUM: Magnesium: 1.7 mg/dL (ref 1.7–2.4)

## 2021-07-14 MED ORDER — ENSURE ENLIVE PO LIQD
237.0000 mL | Freq: Two times a day (BID) | ORAL | Status: DC
  Administered 2021-07-15: 237 mL via ORAL

## 2021-07-14 MED ORDER — ADULT MULTIVITAMIN W/MINERALS CH
1.0000 | ORAL_TABLET | Freq: Every day | ORAL | Status: DC
  Administered 2021-07-14 – 2021-07-15 (×2): 1 via ORAL
  Filled 2021-07-14 (×2): qty 1

## 2021-07-14 MED ORDER — VITAMIN D 25 MCG (1000 UNIT) PO TABS
1000.0000 [IU] | ORAL_TABLET | Freq: Every day | ORAL | Status: DC
  Administered 2021-07-14 – 2021-07-15 (×2): 1000 [IU] via ORAL
  Filled 2021-07-14 (×2): qty 1

## 2021-07-14 MED ORDER — FOSFOMYCIN TROMETHAMINE 3 G PO PACK
3.0000 g | PACK | Freq: Once | ORAL | Status: AC
  Administered 2021-07-14: 3 g via ORAL
  Filled 2021-07-14: qty 3

## 2021-07-14 NOTE — Plan of Care (Signed)

## 2021-07-14 NOTE — Progress Notes (Signed)
Initial Nutrition Assessment RD working remotely.  DOCUMENTATION CODES:   Not applicable  INTERVENTION:  - will order Ensure Enlive BID, each supplement provides 350 kcal and 20 grams of protein. - will order 1 tablet multivitamin with minerals/day. - complete NFPE at follow-up.    NUTRITION DIAGNOSIS:   Inadequate oral intake related to decreased appetite, acute illness as evidenced by per patient/family report.  GOAL:   Patient will meet greater than or equal to 90% of their needs  MONITOR:   PO intake, Supplement acceptance, Labs, Weight trends  REASON FOR ASSESSMENT:   Malnutrition Screening Tool  ASSESSMENT:   86 year old female with medical history of stage 3 CKD, HTN, DM, stroke, arthritis, and shingles. She presented to the ED due to weakness and poor oral intake for several days. She was recently hospitalized for acute CVA and AMS complicated by AKI. She was discharged to rehab and then to home. She presented to the ED from home.  Diet advanced from NPO to CLD on 12/9 at 1922 and to Soft yesterday at 1055. She ate 50% of dinner last night and 75% of lunch today.   Patient noted to have memory impairment.   Weight yesterday was 123 lb and weight on 05/23/21 was 135 lb. This indicates 12 lb weight loss (9% body weight) in the past 2 months; significant for time frame.   Patient was last seen by a [place] RD on 06/05/21 at which time she met criteria for moderate malnutrition in the context of chronic illness as evidenced by moderate fat depletion, moderate and severe muscle depletion.    Labs reviewed; CBGs: 89 mg/dl, creatinine: 1.33 mg/dl, Ca: 8.4 mg/dl, GFR: 39 ml/min.  Medications reviewed; 1000 units cholecalciferol/day, sliding scale novolog, 40 mg oral protonix BID, 40 mEq Klor-Con/day, 1 g carafate TID.     NUTRITION - FOCUSED PHYSICAL EXAM:  RD working remotely.   Diet Order:   Diet Order             DIET SOFT Room service appropriate? Yes;  Fluid consistency: Thin  Diet effective now                   EDUCATION NEEDS:   No education needs have been identified at this time  Skin:  Skin Assessment: Reviewed RN Assessment  Last BM:  12/11 (type 6 x1, large amount)  Height:   Ht Readings from Last 1 Encounters:  07/13/21 5' 5.5" (1.664 m)    Weight:   Wt Readings from Last 1 Encounters:  07/13/21 55.7 kg     Estimated Nutritional Needs:  Kcal:  1500-1700 kcal Protein:  75-85 grams Fluid:  >/= 1.8 L/day      Jarome Matin, MS, RD, LDN, CNSC Inpatient Clinical Dietitian RD pager # available in [facility]  After hours/weekend pager # available in [facility]

## 2021-07-14 NOTE — Evaluation (Signed)
Occupational Therapy Evaluation Patient Details Name: Anna Hoffman MRN: [id number] DOB: [birth year] Today's Date: 07/14/2021   History of Present Illness 86 y.o. female presenting to ED with N/V and weakness x 2-3 days with mutliple falls resulting in trauma to R shoulder (followed by outpatient PT). Patient found to have SIRS. PMH significant for esophagitis, CKD stage IIIa, hypertension, colitis, hypertrophic cardiomyopathy and intracerebral hemorrhage, diabetes.   Clinical Impression   Patient is a 86 year old female who was admitted for above. Patient was living at home with son having just left SNF for rehab per patient report. Currently, patient is mod A to transfer with physical support needed to maintain standing balance with BUE support on RW at recliner. Patient was noted to have increased pain in R shoulder, decreased activity tolerance, decreased endurance, decreased safety awareness, and decreased knowledge of AD/AE impacting participation in ADLs. Patient would continue to benefit from skilled OT services at this time while admitted and after d/c to address noted deficits in order to improve overall safety and independence in ADLs.        Recommendations for follow up therapy are one component of a multi-disciplinary discharge planning process, led by the attending physician.  Recommendations may be updated based on patient status, additional functional criteria and insurance authorization.   Follow Up Recommendations  Skilled nursing-short term rehab (<3 hours/day) (or [facility] services depending on support at home)    Assistance Recommended at Discharge Frequent or constant Supervision/Assistance  Functional Status Assessment     Equipment Recommendations       Recommendations for Other Services       Precautions / Restrictions Precautions Precautions: Fall Restrictions Weight Bearing Restrictions: No      Mobility Bed Mobility Overal bed mobility: Needs  Assistance Bed Mobility: Supine to Sit     Supine to sit: Min assist     General bed mobility comments: assist to elevate trunk    Transfers Overall transfer level: Needs assistance Equipment used: Rolling walker (2 wheels) Transfers: Sit to/from Stand;Bed to chair/wheelchair/BSC Sit to Stand: Min assist     Step pivot transfers: Min assist     General transfer comment: patient was noted to need increased physical assistance to controll descent into chair.      Balance Overall balance assessment: Needs assistance Sitting-balance support: No upper extremity supported;Feet supported Sitting balance-Leahy Scale: Fair     Standing balance support: Reliant on assistive device for balance;During functional activity Standing balance-Leahy Scale: Poor Standing balance comment: patient was unable to maintin standing balance without physical assistance and BUE support                           ADL either performed or assessed with clinical judgement   ADL Overall ADL's : Needs assistance/impaired Eating/Feeding: Set up;Sitting   Grooming: Wash/dry face;Sitting;Wash/dry hands;Set up Grooming Details (indicate cue type and reason): on edge of bed. Upper Body Bathing: Min guard;Sitting Upper Body Bathing Details (indicate cue type and reason): on EOB Lower Body Bathing: Moderate assistance;Sit to/from stand;Sitting/lateral leans   Upper Body Dressing : Sitting;Min guard Upper Body Dressing Details (indicate cue type and reason): patient reporting pain in RUE with movement but still able to participate in task. Lower Body Dressing: Maximal assistance;Sit to/from stand;Sitting/lateral leans Lower Body Dressing Details (indicate cue type and reason): patient is unable to stand up to maintain standing balance on edge of bed. Toilet Transfer: Moderate assistance;Rolling walker (2 wheels) Toilet  Transfer Details (indicate cue type and reason): transfer completed to recliner  in room. Toileting- Clothing Manipulation and Hygiene: Maximal assistance;Sit to/from stand Toileting - Clothing Manipulation Details (indicate cue type and reason): patient was noted to have increased difficulty with standing balance.             Vision Baseline Vision/History: 1 Wears glasses Patient Visual Report: No change from baseline       Perception     Praxis      Pertinent Vitals/Pain       Hand Dominance Right   Extremity/Trunk Assessment Upper Extremity Assessment Upper Extremity Assessment: RUE deficits/detail RUE Deficits / Details: patient reported having a recent shoulder injury with limited ROM. patient noted to have IV in axillary area with redness proximally to elbow. nurse made aware. RUE: Shoulder pain at rest   Lower Extremity Assessment Lower Extremity Assessment: Defer to PT evaluation   Cervical / Trunk Assessment Cervical / Trunk Assessment: Normal   Communication Communication Communication: No difficulties   Cognition                                             General Comments       Exercises     Shoulder Instructions      Home Living Family/patient expects to be discharged to:: Private residence Living Arrangements: Children Available Help at Discharge: Family;Available PRN/intermittently Type of Home: House Home Access: Stairs to enter [employer] of Steps: 2-3 Entrance Stairs-Rails: Right;Left;Can reach both Home Layout: One level     Bathroom Shower/Tub: Teacher, early years/pre: Standard Bathroom Accessibility: Yes   Home Equipment: Conservation officer, nature (2 wheels);BSC/3in1   Additional Comments: pt reports her son is going to hire assist or get her aide (she says he works for [employer])      Prior Functioning/Environment                          OT Problem List: Decreased range of motion;Decreased activity tolerance;Decreased safety awareness;Impaired balance (sitting  and/or standing);Decreased cognition;Pain;Impaired UE functional use      OT Treatment/Interventions: Self-care/ADL training;Therapeutic exercise;Energy conservation;DME and/or AE instruction;Therapeutic activities;Balance training;Patient/family education    OT Goals(Current goals can be found in the care plan section) Acute Rehab OT Goals Patient Stated Goal: to get stronger OT Goal Formulation: With patient Time For Goal Achievement: 07/28/21 Potential to Achieve Goals: Good  OT Frequency: Min 2X/week   Barriers to D/C:            Co-evaluation              AM-PAC OT "6 Clicks" Daily Activity     Outcome Measure Help from another person eating meals?: A Little Help from another person taking care of personal grooming?: A Little Help from another person toileting, which includes using toliet, bedpan, or urinal?: A Lot Help from another person bathing (including washing, rinsing, drying)?: A Lot Help from another person to put on and taking off regular upper body clothing?: A Little Help from another person to put on and taking off regular lower body clothing?: A Lot 6 Click Score: 15   End of Session Equipment Utilized During Treatment: Gait belt;Rolling walker (2 wheels) Nurse Communication: Mobility status;Other (comment) (concerns over RUE)  Activity Tolerance: Patient limited by fatigue;Patient limited by lethargy Patient left: in chair;with  call bell/phone within reach;with chair alarm set  OT Visit Diagnosis: Unsteadiness on feet (R26.81);Pain Pain - Right/Left: Right Pain - part of body: Shoulder                Time: 4461-9012 OT Time Calculation (min): 25 min Charges:  OT General Charges $OT Visit: 1 Visit OT Evaluation $OT Eval Low Complexity: 1 Low OT Treatments $Self Care/Home Management : 8-22 mins  Jackelyn Poling OTR/L, MS Acute Rehabilitation Department Office# [phone] Pager# [phone]   Marcellina Millin 07/14/2021, 12:23 PM

## 2021-07-14 NOTE — Progress Notes (Addendum)
PROGRESS NOTE    Anna Hoffman  FTD:[id number] DOB: 1935/03/30 DOA: 07/11/2021 PCP: Glendale Chard, MD   Brief Narrative:  86 year old with history of DM2, hocm, CKD stage III, HTN presenting with weakness and poor oral intake.  Patient was in the hospital recently for acute CVA and altered mental status complicated by AKI.  Eventually went to rehab.  She was brought back from home due to generalized weakness and poor oral intake for the past couple of days.  Suspicion for UTI and dehydration started on IV Rocephin.  Cultures from last month showed ESBL sensitive to cefepime therefore Rocephin changed to cefepime.   Assessment & Plan:   Principal Problem:   Generalized weakness   Generalized weakness and poor oral intake Metabolic encephalopathy due to urinary tract infection None anion gap metabolic acidosis from dehydration -Encephalopathy has resolved, tolerating orals therefore we will stop IV fluids.  previous urine cultures has grown ESBL.  She has had neurotoxicity with cefepime therefore added to allergy list.  We will give 1 dose of fosfomycin, discussed with pharmacy.  Urine cultures from this admission growing E. coli, susceptibility pending - PT/OT-home health -We will stop bicarb -CT abdomen pelvis-shows some chronic changes but no acute findings.  CT head negative.  MRI brain shows chronic changes  Acute kidney injury on CKD stage III a -Admission creatinine 1.9.  Resolved creatinine back to baseline 1.3  Hypokalemia - Replete electrolytes as necessary  Diabetes mellitus type 2 -Sliding scale and Accu-Chek.  Recent A1c 6.2  Essential hypertension -Norvasc, metoprolol.  Hydralazine on hold.  IV as needed hydralazine and Lopressor ordered  Hyperlipidemia -Statin  Recent CVA November 2022 - On statin.  A1c 6.2, LDL 47.  According to previous notes patient was supposed to be on aspirin therefore we will start her on aspirin  History of HOCM - Supportive  care.    DVT prophylaxis: SCDs Start: 07/12/21 1922 Code Status: Full code Family Communication: Son updated periodically  Status is: Inpatient  Remains inpatient appropriate because: Maintain hospital stay until we have susceptibility data, currently she is requiring IV cefepime given history of ESBL from 4 weeks ago   Subjective: Still feels slightly weak and dizzy but overall much better compared to yesterday.  Examination: Constitutional: Not in acute distress Respiratory: Clear to auscultation bilaterally Cardiovascular: Normal sinus rhythm, no rubs Abdomen: Nontender nondistended good bowel sounds Musculoskeletal: No edema noted Skin: No rashes seen Neurologic: CN 2-12 grossly intact.  And nonfocal Psychiatric: Normal judgment and insight. Alert and oriented x 3. Normal mood.   Objective: Vitals:   07/13/21 1231 07/13/21 1959 07/13/21 2142 07/14/21 0409  BP: 135/66 (!) 142/76 139/78 140/78  Pulse: 78 84 91 82  Resp: 18 16  16   Temp: 97.6 F (36.4 C) 97.9 F (36.6 C)  (!) 97.5 F (36.4 C)  TempSrc: Oral Oral  Oral  SpO2: 99% 98%  97%  Weight:      Height:        Intake/Output Summary (Last 24 hours) at 07/14/2021 1211 Last data filed at 07/14/2021 0600 Gross per 24 hour  Intake 2020.72 ml  Output 750 ml  Net 1270.72 ml   Filed Weights   07/13/21 0232  Weight: 55.7 kg     Data Reviewed:   CBC: Recent Labs  Lab 07/10/21 1157 07/11/21 1007 07/13/21 0417 07/14/21 0400  WBC 14.1* 10.2 6.6 6.4  NEUTROABS 11.2*  --   --   --   HGB 10.9* 11.2* 9.9*  9.5*  HCT 32.9* 35.9* 31.0* 30.0*  MCV 84 90.2 88.3 88.2  PLT 529* 475* 407* 466*   Basic Metabolic Panel: Recent Labs  Lab 07/10/21 1157 07/11/21 1007 07/12/21 0042 07/12/21 2101 07/13/21 0417 07/14/21 0400  NA 136 137 134*  --  136 136  K 3.9 3.5 3.3*  --  3.6 3.9  CL 107* 111 108  --  107 110  CO2 17* 14* 16*  --  19* 19*  GLUCOSE 89 93 185*  --  82 95  BUN 26 24* 27*  --  17 13   CREATININE 1.78* 1.86* 1.92*  --  1.43* 1.33*  CALCIUM 9.5 9.4 9.2  --  8.7* 8.4*  MG  --   --   --  2.1  --  1.7   GFR: Estimated Creatinine Clearance: 27.7 mL/min (A) (by C-G formula based on SCr of 1.33 mg/dL (H)). Liver Function Tests: Recent Labs  Lab 07/10/21 1157 07/11/21 1007 07/13/21 0417  AST 17 17 16   ALT 13 15 13   ALKPHOS 88 68 51  BILITOT 0.3 0.8 0.6  PROT 6.8 7.9 6.3*  ALBUMIN 4.2 4.1 3.1*   No results for input(s): LIPASE, AMYLASE in the last 168 hours. No results for input(s): AMMONIA in the last 168 hours. Coagulation Profile: No results for input(s): INR, PROTIME in the last 168 hours. Cardiac Enzymes: Recent Labs  Lab 07/12/21 0042  CKTOTAL 50   BNP (last 3 results) No results for input(s): PROBNP in the last 8760 hours. HbA1C: Recent Labs    07/12/21 2101  HGBA1C 5.8*   CBG: Recent Labs  Lab 07/12/21 2233 07/13/21 1128 07/13/21 1635 07/13/21 1943 07/14/21 0730  GLUCAP 89 142* 116* 105* 89   Lipid Profile: No results for input(s): CHOL, HDL, LDLCALC, TRIG, CHOLHDL, LDLDIRECT in the last 72 hours. Thyroid Function Tests: Recent Labs    07/13/21 0952  TSH 1.098   Anemia Panel: No results for input(s): VITAMINB12, FOLATE, FERRITIN, TIBC, IRON, RETICCTPCT in the last 72 hours. Sepsis Labs: Recent Labs  Lab 07/12/21 0120 07/12/21 0242  LATICACIDVEN 1.1 0.8    Recent Results (from the past 240 hour(s))  Resp Panel by RT-PCR (Flu A&B, Covid) Nasopharyngeal Swab     Status: None   Collection Time: 07/11/21 11:51 AM   Specimen: Nasopharyngeal Swab; Nasopharyngeal(NP) swabs in vial transport medium  Result Value Ref Range Status   SARS Coronavirus 2 by RT PCR NEGATIVE NEGATIVE Final    Comment: (NOTE) SARS-CoV-2 target nucleic acids are NOT DETECTED.  The SARS-CoV-2 RNA is generally detectable in upper respiratory specimens during the acute phase of infection. The lowest concentration of SARS-CoV-2 viral copies this assay can  detect is 138 copies/mL. A negative result does not preclude SARS-Cov-2 infection and should not be used as the sole basis for treatment or other patient management decisions. A negative result may occur with  improper specimen collection/handling, submission of specimen other than nasopharyngeal swab, presence of viral mutation(s) within the areas targeted by this assay, and inadequate number of viral copies(<138 copies/mL). A negative result must be combined with clinical observations, patient history, and epidemiological information. The expected result is Negative.  Fact Sheet for Patients:  EntrepreneurPulse.com.au  Fact Sheet for Healthcare Providers:  IncredibleEmployment.be  This test is no t yet approved or cleared by the [place] FDA and  has been authorized for detection and/or diagnosis of SARS-CoV-2 by FDA under an Emergency Use Authorization (EUA). This EUA will remain  in  effect (meaning this test can be used) for the duration of the COVID-19 declaration under Section 564(b)(1) of the Act, 21 U.S.C.section 360bbb-3(b)(1), unless the authorization is terminated  or revoked sooner.       Influenza A by PCR NEGATIVE NEGATIVE Final   Influenza B by PCR NEGATIVE NEGATIVE Final    Comment: (NOTE) The Xpert Xpress SARS-CoV-2/FLU/RSV plus assay is intended as an aid in the diagnosis of influenza from Nasopharyngeal swab specimens and should not be used as a sole basis for treatment. Nasal washings and aspirates are unacceptable for Xpert Xpress SARS-CoV-2/FLU/RSV testing.  Fact Sheet for Patients: EntrepreneurPulse.com.au  Fact Sheet for Healthcare Providers: IncredibleEmployment.be  This test is not yet approved or cleared by the [place] FDA and has been authorized for detection and/or diagnosis of SARS-CoV-2 by FDA under an Emergency Use Authorization (EUA). This EUA will remain in  effect (meaning this test can be used) for the duration of the COVID-19 declaration under Section 564(b)(1) of the Act, 21 U.S.C. section 360bbb-3(b)(1), unless the authorization is terminated or revoked.  Performed at [facility], [place] [address], [place], [place] [address]   Urine Culture     Status: Abnormal (Preliminary result)   Collection Time: 07/12/21  4:24 AM   Specimen: Urine, Clean Catch  Result Value Ref Range Status   Specimen Description   Final    URINE, CLEAN CATCH Performed at [facility], [place] [address] [place], [place] [address]    Special Requests   Final    NONE Performed at [facility], [place] [address], [place], [place] [address]    Culture (A)  Final    >=100,000 COLONIES/mL ESCHERICHIA COLI SUSCEPTIBILITIES TO FOLLOW Performed at [facility], [place] [address] [place], [place] [address]    Report Status PENDING  Incomplete         Radiology Studies: US RENAL  Result Date: 07/12/2021 CLINICAL DATA:  Acute kidney injury EXAM: RENAL / URINARY TRACT ULTRASOUND COMPLETE COMPARISON:  05/28/2021.  CT earlier today FINDINGS: Right Kidney: Renal measurements: 9.0 x 4.4 x 4.5 cm = volume: 91 mL. 1.7 cm upper pole cyst. Normal echotexture. No hydronephrosis. Left Kidney: Renal measurements: 8.1 x 4.0 x 4.8 cm = volume: 82 mL. 2.3 cm lower pole cyst. 3.5 cm echogenic solid mass in the upper pole. This is most compatible with angiomyolipoma. No hydronephrosis. Bladder: Debris floating within the bladder. Other: None. IMPRESSION: No acute findings.  No hydronephrosis. 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 Bilateral renal cysts. Electronically Signed   By: Rolm Baptise M.D.   On: 07/12/2021 20:24        Scheduled Meds:  amLODipine  10 mg Oral Daily   aspirin EC  81 mg Oral Daily   chlorhexidine  15 mL Mouth Rinse BID   cholecalciferol  1,000 Units  Oral Daily   hydrALAZINE  100 mg Oral Q8H   insulin aspart  0-5 Units Subcutaneous QHS   insulin aspart  0-9 Units Subcutaneous TID WC   mouth rinse  15 mL Mouth Rinse q12n4p   metoprolol tartrate  12.5 mg Oral BID   pantoprazole  40 mg Oral BID   potassium chloride  40 mEq Oral Daily   rosuvastatin  20 mg Oral Daily   sodium bicarbonate  650 mg Oral BID   sucralfate  1 g Oral TID WC & HS   Continuous Infusions:  LOS: 2 days   Time spent= 35 mins    Alexsander Cavins Arsenio Loader, MD [facility]  If 7PM-7AM, please contact night-coverage  07/14/2021, 12:11 PM

## 2021-07-15 ENCOUNTER — Ambulatory Visit: Payer: Medicare Other

## 2021-07-15 ENCOUNTER — Other Ambulatory Visit (HOSPITAL_COMMUNITY): Payer: Self-pay

## 2021-07-15 DIAGNOSIS — E1122 Type 2 diabetes mellitus with diabetic chronic kidney disease: Secondary | ICD-10-CM

## 2021-07-15 DIAGNOSIS — I1310 Hypertensive heart and chronic kidney disease without heart failure, with stage 1 through stage 4 chronic kidney disease, or unspecified chronic kidney disease: Secondary | ICD-10-CM

## 2021-07-15 LAB — BASIC METABOLIC PANEL WITH GFR
Anion gap: 9 (ref 5–15)
BUN: 13 mg/dL (ref 8–23)
CO2: 18 mmol/L — ABNORMAL LOW (ref 22–32)
Calcium: 8.8 mg/dL — ABNORMAL LOW (ref 8.9–10.3)
Chloride: 109 mmol/L (ref 98–111)
Creatinine, Ser: 1.35 mg/dL — ABNORMAL HIGH (ref 0.44–1.00)
GFR, Estimated: 39 mL/min — ABNORMAL LOW
Glucose, Bld: 94 mg/dL (ref 70–99)
Potassium: 3.7 mmol/L (ref 3.5–5.1)
Sodium: 136 mmol/L (ref 135–145)

## 2021-07-15 LAB — CBC
HCT: 30.3 % — ABNORMAL LOW (ref 36.0–46.0)
Hemoglobin: 9.5 g/dL — ABNORMAL LOW (ref 12.0–15.0)
MCH: 27.4 pg (ref 26.0–34.0)
MCHC: 31.4 g/dL (ref 30.0–36.0)
MCV: 87.3 fL (ref 80.0–100.0)
Platelets: 429 10*3/uL — ABNORMAL HIGH (ref 150–400)
RBC: 3.47 MIL/uL — ABNORMAL LOW (ref 3.87–5.11)
RDW: 15.1 % (ref 11.5–15.5)
WBC: 7.4 10*3/uL (ref 4.0–10.5)
nRBC: 0 % (ref 0.0–0.2)

## 2021-07-15 LAB — URINE CULTURE: Culture: >=100000 — AB

## 2021-07-15 LAB — GLUCOSE, CAPILLARY
Glucose-Capillary: 95 mg/dL (ref 70–99)
Glucose-Capillary: 105 mg/dL — ABNORMAL HIGH (ref 70–99)

## 2021-07-15 LAB — MAGNESIUM: Magnesium: 1.9 mg/dL (ref 1.7–2.4)

## 2021-07-15 MED ORDER — VITAMIN D3 25 MCG PO TABS
1000.0000 [IU] | ORAL_TABLET | Freq: Every day | ORAL | 0 refills | Status: AC
  Filled 2021-07-15: qty 30, 30d supply, fill #0

## 2021-07-15 MED ORDER — ASPIRIN 81 MG PO TBEC
81.0000 mg | DELAYED_RELEASE_TABLET | Freq: Every day | ORAL | 0 refills | Status: DC
Start: 2021-07-15 — End: 2021-12-05
  Filled 2021-07-15: qty 30, 30d supply, fill #0

## 2021-07-15 NOTE — Chronic Care Management (AMB) (Signed)
Chronic Care Management    Social Work Note  07/15/2021 Name: Anna Hoffman MRN: [id number] DOB: 03-23-1935  Anna Hoffman is a 86 y.o. year old female who is a primary care patient of Glendale Chard, MD. The CCM team was consulted to assist the patient with chronic disease management and/or care coordination needs related to:  HTN, DM II .   Engaged with patients son [name] by phone  for follow up visit in response to provider referral for social work chronic care management and care coordination services.   Consent to Services:  The patient was given information about Chronic Care Management services, agreed to services, and gave verbal consent prior to initiation of services.  Please see initial visit note for detailed documentation.   Patient agreed to services and consent obtained.   Assessment: Review of patient past medical history, allergies, medications, and health status, including review of relevant consultants reports was performed today as part of a comprehensive evaluation and provision of chronic care management and care coordination services.     SDOH (Social Determinants of Health) assessments and interventions performed:    Advanced Directives Status: Not addressed in this encounter.  CCM Care Plan  Allergies  Allergen Reactions   Amoxicillin Diarrhea    Has patient had a PCN reaction causing immediate rash, facial/tongue/throat swelling, SOB or lightheadedness with hypotension: no Has patient had a PCN reaction causing severe rash involving mucus membranes or skin necrosis: no Has patient had a PCN reaction that required hospitalization: no pharmacist consult Has patient had a PCN reaction occurring within the last 10 years: yes If all of the above answers are "NO", then may proceed with Cephalosporin use.    Cefepime     Possible CNS toxicity 05/2021   Ampicillin Rash    - Tolerates Rocephin and Ancef - Remote occurrence; no symptoms of anaphylaxis or severe  cutaneous reaction, and no additional medical attention required     Sulfa Antibiotics Rash    Outpatient Encounter Medications as of 07/15/2021  Medication Sig   acetaminophen (TYLENOL) 325 MG tablet Take 2 tablets (650 mg total) by mouth every 6 (six) hours as needed for mild pain (or Fever >/= 101). (Patient not taking: Reported on 07/15/2021)   amLODipine (NORVASC) 10 MG tablet Take 1 tablet (10 mg total) by mouth daily.   aspirin 81 MG EC tablet Take 1 tablet (81 mg total) by mouth daily. Swallow whole.   Vitamin D3 (VITAMIN D) 25 MCG tablet Take 1 tablet (1,000 Units total) by mouth daily.   diclofenac Sodium (VOLTAREN) 1 % GEL Apply 2 g topically every 12 (twelve) hours to left ankle and right shoulder pain   fexofenadine (ALLEGRA ALLERGY) 180 MG tablet Take 1 tablet (180 mg total) by mouth daily. (Patient not taking: Reported on 07/10/2021)   glucose blood test strip Use as directed to check blood sugars 1 time per day.   glucose monitoring kit (FREESTYLE) monitoring kit Use as directed to check blood sugars 1 time per day dx: e11.22   hydrALAZINE (APRESOLINE) 100 MG tablet Take 1 tablet (100 mg total) by mouth every 8 (eight) hours.   insulin aspart (NOVOLOG) 100 UNIT/ML FlexPen Inject into the skin with meals per sliding scale. 151-200= 2 units, 201-250= 4 units, 251-300= 6 units, 301-350=8 units, 351-400=10 units, >401=10 units and notify MD (Patient taking differently: Inject 2-10 Units into the skin in the morning and at bedtime.)   insulin aspart (NOVOLOG) 100 UNIT/ML injection Inject 0-9  Units into the skin 3 (three) times daily with meals. (Patient not taking: Reported on 07/10/2021)   Lancets (ONETOUCH DELICA PLUS [id number]) MISC Use to check blood sugar once daily   metoprolol tartrate (LOPRESSOR) 25 MG tablet Take 0.5 tablets (12.5 mg total) by mouth 2 (two) times daily.   pantoprazole (PROTONIX) 40 MG tablet Take 1 tablet (40 mg total) by mouth 2 (two) times daily.    prochlorperazine (COMPAZINE) 5 MG tablet Take 1 tablet (5 mg total) by mouth every 6 (six) hours as needed for nausea or vomiting. (Patient not taking: Reported on 07/10/2021)   rosuvastatin (CRESTOR) 20 MG tablet Take 1 tablet (20 mg total) by mouth daily.   sucralfate (CARAFATE) 1 GM/10ML suspension Take 10 mLs (1 g total) by mouth 4 (four) times daily -  with meals and at bedtime for 28 days   Facility-Administered Encounter Medications as of 07/15/2021  Medication   acetaminophen (TYLENOL) tablet 650 mg   Or   acetaminophen (TYLENOL) suppository 650 mg   amLODipine (NORVASC) tablet 10 mg   aspirin EC tablet 81 mg   chlorhexidine (PERIDEX) 0.12 % solution 15 mL   cholecalciferol (VITAMIN D3) tablet 1,000 Units   feeding supplement (ENSURE ENLIVE / ENSURE PLUS) liquid 237 mL   guaiFENesin (ROBITUSSIN) 100 MG/5ML liquid 5 mL   hydrALAZINE (APRESOLINE) injection 10 mg   hydrALAZINE (APRESOLINE) tablet 100 mg   insulin aspart (novoLOG) injection 0-5 Units   insulin aspart (novoLOG) injection 0-9 Units   MEDLINE mouth rinse   metoprolol tartrate (LOPRESSOR) injection 5 mg   metoprolol tartrate (LOPRESSOR) tablet 12.5 mg   multivitamin with minerals tablet 1 tablet   oxyCODONE (Oxy IR/ROXICODONE) immediate release tablet 5 mg   pantoprazole (PROTONIX) EC tablet 40 mg   potassium chloride SA (KLOR-CON M) CR tablet 40 mEq   prochlorperazine (COMPAZINE) injection 10 mg   rosuvastatin (CRESTOR) tablet 20 mg   senna-docusate (Senokot-S) tablet 1 tablet   sucralfate (CARAFATE) 1 GM/10ML suspension 1 g   traZODone (DESYREL) tablet 50 mg    Patient Active Problem List   Diagnosis Date Noted   Generalized weakness 27/74/1287   Acute metabolic encephalopathy    Malnutrition of moderate degree 05/24/2021   Sepsis ([place]) 05/23/2021   SIRS (systemic inflammatory response syndrome) ([place]) 05/22/2021   History of CVA (cerebrovascular accident) 05/22/2021   Gait instability 04/24/2021   Seasonal  allergic rhinitis due to pollen 04/24/2021   Ascending aorta dilatation ([place]) 04/05/2021   HOCM (hypertrophic obstructive cardiomyopathy) ([place]) 04/04/2021   Physical deconditioning 04/04/2021   Syncope 04/03/2021   Fall at home, initial encounter 03/31/2021   Thyroid nodule 03/31/2021   CAP (community acquired pneumonia) 11/15/2020   Dehydration 11/15/2020   Erosive esophagitis    Hiatal hernia    Hyponatremia 10/10/2020   Acute kidney injury superimposed on chronic kidney disease ([place]) 10/10/2020   Aortic atherosclerosis ([place]) 10/09/2020   Acute renal failure superimposed on stage 3a chronic kidney disease ([place]) 10/09/2020   Hyperkalemia 10/09/2020   Hearing loss 10/09/2020   Prolonged QT interval 10/09/2020   Colitis, acute 08/11/2019   Colitis 07/11/2019   Loose stools 06/07/2019   Herpes zoster without complication 86/76/7209   Other long term (current) drug therapy 08/12/2018   Chronic kidney disease, stage 3a ([place]) 07/19/2018   Hypertensive nephropathy 07/19/2018   Community acquired pneumonia of right upper lobe of lung 11/07/2017   Hypertensive emergency 07/01/2016   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  [place])    Benign essential HTN    Type 2 diabetes mellitus with stage 3 chronic kidney disease, without long-term current use of insulin ([place])    Mixed hyperlipidemia    ICH (intracerebral hemorrhage) ([place]) - hypertensive R thalamic hemorrhage  06/27/2016   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   Retroperitoneal bleed 02/08/2016   Generalized abdominal pain    Gastroenteritis 02/04/2016   Diarrhea 02/04/2016   Hypokalemia 02/04/2016   Incarcerated paraesophageal hernia 09/02/2014   Renal mass, left 09/02/2014   Acute esophagitis 09/02/2014   Gastric outlet obstruction 09/02/2014   Hypertension    Vomiting 09/01/2014    Conditions to be addressed/monitored: HTN and DMII; Level of care concerns and Limited access to  caregiver  Care Plan : Social Work Care Plan  Updates made by Daneen Schick since 07/15/2021 12:00 AM     Problem: Mobility and Independence      Goal: Mobility and Independence Optimized   Start Date: 04/24/2021  Expected End Date: 06/08/2021  Recent Progress: On track  Priority: High  Note:   Current Barriers:  Chronic disease management support and education needs related to HTN and DM  ADL IADL limitations and Limited access to caregiver  Social Worker Clinical Goal(s):  patient will work with SW to identify and address any acute and/or chronic care coordination needs related to the self health management of HTN and DM  patient will work with SW to address concerns related to level of care needs  SW Interventions:  Inter-disciplinary care team collaboration (see longitudinal plan of care) Collaboration with Glendale Chard, MD regarding development and update of comprehensive plan of care as evidenced by provider attestation and co-signature Performed chart review to note patient planned discharge to home today with home health orders in place Received voice message from Cyndia Skeeters with [facility] in [place] requesting recent office visit notes Successful outbound call placed to patients son [name] to determine that although the location is not ideal he is open to exploring this option  Collaboration with patients primary provider Dr. Baird Cancer to request recent office visit notes be sent to Cyndia Skeeters for review Calls placed to the following SNF's seeking bed offers: [facility] - no long term beds available [facility]- voice message left for [facility] requesting a return call [facility]- no long term beds available [facility] - no beds available, over a year wait list [facility]- no beds available [facility] - voice message left for [facility] requesting a return call. Received a voice message from Mrs. Barnet Pall stating she would email admission requirements to SW  for review [place] [facility] - no answer no option to leave voice message [facility] - left a message for Dorene Sorrow admission director requesting a return call [facility] - no beds available [facility] - left voice message requesting a return call [facility]- no beds available wait list of over 78 people [place] - spoke with [place], beds are available but unable to admit due to active COVID cases [facility] - no answer, no option to leave a voice message [place] - spoke with Shirlee Limerick who indicates they do have a bed and the patient must have long term care Medicaid approved prior to admission [facility] - spoke with [facility] who indicates a bed is available. She requests FL2, office visit notes, recent labs, and vaccine history be faxed to [phone] for review Collaboration with Dr. Baird Cancer requesting Phoebe Perch, office visit notes and labs be  faxed to [facility] [phone] attention Sidney Ace as well as to [facility] [phone] attention Sheree  Patient Goals/Self-Care Activities patient will: with the help of her son  -  Work with SW to identify long term placement opportunities  Follow Up Plan:  SW will follow up with status of bed offers over the next 3 days       Follow Up Plan: SW will follow up with patient by phone over the next 3 days      Daneen Schick, BSW, CDP Social Worker, Certified Dementia Practitioner [place] [facility] [place] [facility] [phone]

## 2021-07-15 NOTE — Discharge Summary (Signed)
Physician Discharge Summary  Arden Axon [facility] GEX:[id number] DOB: Jan 30, 1935 DOA: 07/11/2021  PCP: Glendale Chard, MD  Admit date: 07/11/2021 Discharge date: 07/15/2021  Admitted From: Home Disposition: Home with home health  Recommendations for Outpatient Follow-up:  Follow up with PCP in 1-2 weeks Please obtain BMP/CBC in one week your next doctors visit.  Completed treatment with fosfomycin in the hospital for her ESBL UTI Not sure why patient was never on aspirin with recent CVA even though it is documented in the past.  We will prescribe her aspirin daily.  Home Health: Arranged by [facility] Discharge Condition: Stable CODE STATUS: Full code Diet recommendation: Diabetic  Brief/Interim Summary: 86 year old with history of DM2, hocm, CKD stage III, HTN presenting with weakness and poor oral intake.  Patient was in the hospital recently for acute CVA and altered mental status complicated by AKI.  Eventually went to rehab.  She was brought back from home due to generalized weakness and poor oral intake for the past couple of days.  Suspicion for UTI and dehydration started on IV Rocephin.  Cultures from last month showed ESBL sensitive to cefepime therefore Rocephin changed to cefepime.  But due to history of neurotoxicity and after discussion with pharmacy she was given a dose of fosfomycin.  She was seen by physical therapy who recommended home health.  Patient told me that she has felt the best that she has felt in months and therefore would like to go home.  Medically stable for discharge.  Family updated throughout the hospital stay.     Assessment & Plan:   Principal Problem:   Generalized weakness     Generalized weakness and poor oral intake Metabolic encephalopathy due to urinary tract infection None anion gap metabolic acidosis from dehydration - Encephalopathy has resolved.  Urine cultures ended up growing ESBL and due to history of neurotoxicity from cefepime she was given a dose  of fosfomycin after discussing with pharmacy. - PT/OT-home health, arrangements to be made by [facility] -CT abdomen pelvis-shows some chronic changes but no acute findings.  CT head negative.  MRI brain shows chronic changes   Acute kidney injury on CKD stage III a -Admission creatinine 1.9.  Resolved creatinine back to baseline 1.3   Hypokalemia - Replete electrolytes as necessary   Diabetes mellitus type 2 -Sliding scale and Accu-Chek.  Recent A1c 6.2  Essential hypertension - Resume home regimen  Hyperlipidemia -Statin   Recent CVA November 2022 - On statin.  A1c 6.2, LDL 47.  According to previous notes patient was supposed to be on aspirin therefore we will start her on aspirin   History of HOCM - Supportive care.      Body mass index is 20.12 kg/m.         Discharge Diagnoses:  Principal Problem:   Generalized weakness      Consultations: none  Subjective: Tells me she feels the best she has in the last several months.  Wants to go home.  No other complaints.  Discharge Exam: Vitals:   07/14/21 2037 07/15/21 0533  BP: (!) 109/50 (!) 147/80  Pulse: 91 89  Resp: 20 16  Temp: 97.8 F (36.6 C) 98 F (36.7 C)  SpO2: 100% 97%   Vitals:   07/14/21 0409 07/14/21 1318 07/14/21 2037 07/15/21 0533  BP: 140/78 (!) 146/71 (!) 109/50 (!) 147/80  Pulse: 82 81 91 89  Resp: _0 Temp: (!) 97.5 F (36.4 C)  97.8 F (36.6 C) 98 F (  36.7 C)  TempSrc: Oral  Oral Oral  SpO2: 97% 99% 100% 97%  Weight:      Height:        General: Pt is alert, awake, not in acute distress Cardiovascular: RRR, S1/S2 +, no rubs, no gallops Respiratory: CTA bilaterally, no wheezing, no rhonchi Abdominal: Soft, NT, ND, bowel sounds + Extremities: no edema, no cyanosis  Discharge Instructions   Allergies as of 07/15/2021       Reactions   Amoxicillin Diarrhea   Has patient had a PCN reaction causing immediate rash, facial/tongue/throat swelling, SOB or  lightheadedness with hypotension: no Has patient had a PCN reaction causing severe rash involving mucus membranes or skin necrosis: no Has patient had a PCN reaction that required hospitalization: no pharmacist consult Has patient had a PCN reaction occurring within the last 10 years: yes If all of the above answers are "NO", then may proceed with Cephalosporin use.   Cefepime    Possible CNS toxicity 05/2021   Ampicillin Rash   - Tolerates Rocephin and Ancef - Remote occurrence; no symptoms of anaphylaxis or severe cutaneous reaction, and no additional medical attention required   Sulfa Antibiotics Rash        Medication List     TAKE these medications    acetaminophen 325 MG tablet Commonly known as: TYLENOL Take 2 tablets (650 mg total) by mouth every 6 (six) hours as needed for mild pain (or Fever >/= 101).   amLODipine 10 MG tablet Commonly known as: NORVASC Take 1 tablet (10 mg total) by mouth daily.   aspirin 81 MG EC tablet Take 1 tablet (81 mg total) by mouth daily. Swallow whole.   diclofenac Sodium 1 % Gel Commonly known as: VOLTAREN Apply 2 g topically every 12 (twelve) hours to left ankle and right shoulder pain   fexofenadine 180 MG tablet Commonly known as: Allegra Allergy Take 1 tablet (180 mg total) by mouth daily.   glucose monitoring kit monitoring kit Use as directed to check blood sugars 1 time per day dx: e11.22   hydrALAZINE 100 MG tablet Commonly known as: APRESOLINE Take 1 tablet (100 mg total) by mouth every 8 (eight) hours.   insulin aspart 100 UNIT/ML injection Commonly known as: novoLOG Inject 0-9 Units into the skin 3 (three) times daily with meals. What changed: Another medication with the same name was changed. Make sure you understand how and when to take each.   NovoLOG FlexPen 100 UNIT/ML FlexPen Generic drug: insulin aspart Inject into the skin with meals per sliding scale. 151-200= 2 units, 201-250= 4 units, 251-300= 6 units,  301-350=8 units, 351-400=10 units, >401=10 units and notify MD What changed:  how much to take when to take this   metoprolol tartrate 25 MG tablet Commonly known as: LOPRESSOR Take 0.5 tablets (12.5 mg total) by mouth 2 (two) times daily.   OneTouch Delica Plus [id number] Misc Use to check blood sugar once daily   OneTouch Verio test strip Generic drug: glucose blood Use as directed to check blood sugars 1 time per day.   pantoprazole 40 MG tablet Commonly known as: PROTONIX Take 1 tablet (40 mg total) by mouth 2 (two) times daily.   prochlorperazine 5 MG tablet Commonly known as: COMPAZINE Take 1 tablet (5 mg total) by mouth every 6 (six) hours as needed for nausea or vomiting.   rosuvastatin 20 MG tablet Commonly known as: CRESTOR Take 1 tablet (20 mg total) by mouth daily.   sucralfate 1  GM/10ML suspension Commonly known as: CARAFATE Take 10 mLs (1 g total) by mouth 4 (four) times daily -  with meals and at bedtime for 28 days   Vitamin D3 25 MCG tablet Commonly known as: Vitamin D Take 1 tablet (1,000 Units total) by mouth daily.        Follow-up Information     Glendale Chard, MD Follow up in 1 week(s).   Specialty: Internal Medicine Contact information: [address] [place] [address] [phone]         Donato Heinz, MD .   Specialties: Cardiology, Radiology Contact information: [address] [place] [address] [phone]                Allergies  Allergen Reactions   Amoxicillin Diarrhea    Has patient had a PCN reaction causing immediate rash, facial/tongue/throat swelling, SOB or lightheadedness with hypotension: no Has patient had a PCN reaction causing severe rash involving mucus membranes or skin necrosis: no Has patient had a PCN reaction that required hospitalization: no pharmacist consult Has patient had a PCN reaction occurring within the last 10 years: yes If all of the above  answers are "NO", then may proceed with Cephalosporin use.    Cefepime     Possible CNS toxicity 05/2021   Ampicillin Rash    - Tolerates Rocephin and Ancef - Remote occurrence; no symptoms of anaphylaxis or severe cutaneous reaction, and no additional medical attention required     Sulfa Antibiotics Rash    You were cared for by a hospitalist during your hospital stay. If you have any questions about your discharge medications or the care you received while you were in the hospital after you are discharged, you can call the unit and asked to speak with the hospitalist on call if the hospitalist that took care of you is not available. Once you are discharged, your primary care physician will handle any further medical issues. Please note that no refills for any discharge medications will be authorized once you are discharged, as it is imperative that you return to your primary care physician (or establish a relationship with a primary care physician if you do not have one) for your aftercare needs so that they can reassess your need for medications and monitor your lab values.   Procedures/Studies: CT ABDOMEN PELVIS WO CONTRAST  Result Date: 07/12/2021 CLINICAL DATA:  Acute nonlocalized abdominal pain, nausea, vomiting EXAM: CT ABDOMEN AND PELVIS WITHOUT CONTRAST TECHNIQUE: Multidetector CT imaging of the abdomen and pelvis was performed following the standard protocol without IV contrast. COMPARISON:  None. FINDINGS: Lower chest: Lung bases are clear. Extensive coronary artery calcification. Moderate hiatal hernia. Hepatobiliary: No focal liver abnormality is seen. No gallstones, gallbladder wall thickening, or biliary dilatation. Pancreas: Unremarkable Spleen: Unremarkable Adrenals/Urinary Tract: The adrenal glands are unremarkable. The kidneys are normal in size and position. A macroscopic fat containing heterogeneous mass arises from the upper pole the left kidney measuring 1.8 x 2.9 x 3.1 cm  compatible with a renal angiomyolipoma. This is unchanged from immediate prior examination and has significantly decreased in size since remote prior examination of 09/01/2014 in keeping with interval embolization on 04/10/2016. The kidneys are otherwise unremarkable. Moderate gas is seen within the bladder lumen. The bladder is otherwise unremarkable and no perivesicular inflammatory changes are identified. Stomach/Bowel: Moderate sigmoid diverticulosis without superimposed inflammatory change. The stomach, large bowel, and small bowel are otherwise unremarkable. Appendix normal. No free intraperitoneal gas or  fluid. Vascular/Lymphatic: Aortic atherosclerosis. No enlarged abdominal or pelvic lymph nodes. Reproductive: Status post hysterectomy. No adnexal masses. Other: Tiny bilateral fat containing inguinal hernias. Musculoskeletal: Degenerative changes are seen within the lumbar spine. No acute bone abnormality. No lytic or blastic bone lesion. IMPRESSION: No acute intra-abdominal pathology identified. No definite radiographic explanation for the patient's reported symptoms. Moderate gas within the bladder lumen. Correlation for recent catheterization or cystoscopic procedure is recommended. Alternative considerations such as aggressive infection or enteric fistula are considered less likely given the lack of surrounding inflammatory change. Extensive coronary artery calcification. Moderate hiatal hernia. 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 Moderate sigmoid diverticulosis. Aortic Atherosclerosis (ICD10-I70.0). Electronically Signed   By: Fidela Salisbury M.D.   On: 07/12/2021 01:58   CT Head Wo Contrast  Result Date: 07/11/2021 CLINICAL DATA:  Headache, emesis, anemia EXAM: CT HEAD WITHOUT CONTRAST TECHNIQUE: Contiguous axial images were obtained from the base of the skull through the vertex without intravenous  contrast. COMPARISON:  05/26/2021 FINDINGS: Brain: Stable atrophy pattern and extensive white matter microvascular ischemic changes throughout both cerebral hemispheres. Remote basal ganglia infarcts. Small remote left cerebellar infarct. No acute intracranial hemorrhage, new mass lesion, new infarction, midline shift, herniation, hydrocephalus, or extra-axial fluid collection. Cisterns are patent. No cerebellar abnormality. Vascular: Intracranial atherosclerosis at the skull base. No hyperdense vessel. Skull: Normal. Negative for fracture or focal lesion. Sinuses/Orbits: No acute finding. Other: None. IMPRESSION: Stable atrophy and extensive white matter microvascular ischemic changes. No acute intracranial abnormality or significant change by noncontrast CT. Electronically Signed   By: Jerilynn Mages.  Shick M.D.   On: 07/11/2021 13:55   MR BRAIN WO CONTRAST  Result Date: 07/12/2021 CLINICAL DATA:  86 year old female with dizziness. Headache, vomiting, anemia. EXAM: MRI HEAD WITHOUT CONTRAST TECHNIQUE: Multiplanar, multiecho pulse sequences of the brain and surrounding structures were obtained without intravenous contrast. COMPARISON:  Head CT 07/11/2021.  Brain MRI 05/27/2021. FINDINGS: Brain: No restricted diffusion or evidence of acute infarction. A expected evolution of the punctate abnormal diffusion foci previously seen in the right centrum semiovale and near the splenium of the corpus callosum and October. Chronic severe background T2 and FLAIR heterogeneity in the bilateral cerebral white matter, and bilateral deep gray matter nuclei (especially the right thalamus with volume loss). Patchy multifocal chronic small-vessel ischemia also in the pons and occasional small chronic infarcts in the bilateral cerebellum. No definite cortical encephalomalacia, although there are fairly numerous chronic microhemorrhages in the bilateral deep gray nuclei, occasionally scattered in the cerebral white matter, brainstem and  cerebellum which appears stable. No midline shift, mass effect, evidence of mass lesion, ventriculomegaly, extra-axial collection or acute intracranial hemorrhage. Cervicomedullary junction and pituitary are within normal limits. Vascular: Major intracranial vascular flow voids are stable with generalized intracranial artery tortuosity. Particularly ectatic posterior circulation redemonstrated with associated mild mass effect on the ventral brainstem. Skull and upper cervical spine: Multilevel degenerative appearing spondylolisthesis in the cervical spine with advanced disc and endplate degeneration. Up to mild associated spinal stenosis. Visualized bone marrow signal is within normal limits. Sinuses/Orbits: Orbits appears stable, negative. Decreased but not resolved fluid and mucosal thickening in the left sphenoid sinus. Ongoing mucosal thickening at the right frontoethmoidal recess. Other paranasal sinuses and mastoids are stable and well aerated. Other: Grossly normal visible internal auditory structures. Negative visible scalp and face. IMPRESSION: 1. No acute intracranial abnormality. 2. Severe chronic small vessel disease, without progression since the October MRI. Severe  involvement of the deep gray nuclei with clustered chronic micro-emorrhages associated. Electronically Signed   By: Genevie Ann M.D.   On: 07/12/2021 07:19   US RENAL  Result Date: 07/12/2021 CLINICAL DATA:  Acute kidney injury EXAM: RENAL / URINARY TRACT ULTRASOUND COMPLETE COMPARISON:  05/28/2021.  CT earlier today FINDINGS: Right Kidney: Renal measurements: 9.0 x 4.4 x 4.5 cm = volume: 91 mL. 1.7 cm upper pole cyst. Normal echotexture. No hydronephrosis. Left Kidney: Renal measurements: 8.1 x 4.0 x 4.8 cm = volume: 82 mL. 2.3 cm lower pole cyst. 3.5 cm echogenic solid mass in the upper pole. This is most compatible with angiomyolipoma. No hydronephrosis. Bladder: Debris floating within the bladder. Other: None. IMPRESSION: No acute  findings.  No hydronephrosis. 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 Bilateral renal cysts. Electronically Signed   By: Rolm Baptise M.D.   On: 07/12/2021 20:24   DG Chest Portable 1 View  Result Date: 07/12/2021 CLINICAL DATA:  Weakness, vomiting. EXAM: PORTABLE CHEST 1 VIEW COMPARISON:  None. FINDINGS: The heart is normal in size. Atherosclerotic calcification of the aorta is noted. The lungs are clear without effusions or infiltrates. No pneumothorax is seen. No acute osseous abnormality. IMPRESSION: No acute cardiopulmonary process. Electronically Signed   By: Brett Fairy M.D.   On: 07/12/2021 01:41     The results of significant diagnostics from this hospitalization (including imaging, microbiology, ancillary and laboratory) are listed below for reference.     Microbiology: Recent Results (from the past 240 hour(s))  Resp Panel by RT-PCR (Flu A&B, Covid) Nasopharyngeal Swab     Status: None   Collection Time: 07/11/21 11:51 AM   Specimen: Nasopharyngeal Swab; Nasopharyngeal(NP) swabs in vial transport medium  Result Value Ref Range Status   SARS Coronavirus 2 by RT PCR NEGATIVE NEGATIVE Final    Comment: (NOTE) SARS-CoV-2 target nucleic acids are NOT DETECTED.  The SARS-CoV-2 RNA is generally detectable in upper respiratory specimens during the acute phase of infection. The lowest concentration of SARS-CoV-2 viral copies this assay can detect is 138 copies/mL. A negative result does not preclude SARS-Cov-2 infection and should not be used as the sole basis for treatment or other patient management decisions. A negative result may occur with  improper specimen collection/handling, submission of specimen other than nasopharyngeal swab, presence of viral mutation(s) within the areas targeted by this assay, and inadequate number of viral copies(<138 copies/mL). A negative result must be combined with clinical observations, patient  history, and epidemiological information. The expected result is Negative.  Fact Sheet for Patients:  EntrepreneurPulse.com.au  Fact Sheet for Healthcare Providers:  IncredibleEmployment.be  This test is no t yet approved or cleared by the [place] FDA and  has been authorized for detection and/or diagnosis of SARS-CoV-2 by FDA under an Emergency Use Authorization (EUA). This EUA will remain  in effect (meaning this test can be used) for the duration of the COVID-19 declaration under Section 564(b)(1) of the Act, 21 U.S.C.section 360bbb-3(b)(1), unless the authorization is terminated  or revoked sooner.       Influenza A by PCR NEGATIVE NEGATIVE Final   Influenza B by PCR NEGATIVE NEGATIVE Final    Comment: (NOTE) The Xpert Xpress SARS-CoV-2/FLU/RSV plus assay is intended as an aid in the diagnosis of influenza from Nasopharyngeal swab specimens and should not be used as a sole basis for treatment. Nasal washings and aspirates are unacceptable for Xpert Xpress SARS-CoV-2/FLU/RSV testing.  Fact Sheet  for Patients: EntrepreneurPulse.com.au  Fact Sheet for Healthcare Providers: IncredibleEmployment.be  This test is not yet approved or cleared by the [place] FDA and has been authorized for detection and/or diagnosis of SARS-CoV-2 by FDA under an Emergency Use Authorization (EUA). This EUA will remain in effect (meaning this test can be used) for the duration of the COVID-19 declaration under Section 564(b)(1) of the Act, 21 U.S.C. section 360bbb-3(b)(1), unless the authorization is terminated or revoked.  Performed at [facility], [place] [address] [place], [place] [address]   Urine Culture     Status: Abnormal   Collection Time: 07/12/21  4:24 AM   Specimen: Urine, Clean Catch  Result Value Ref Range Status   Specimen Description   Final    URINE, CLEAN CATCH Performed at  [facility], [place] [address], [place], [place] [address]    Special Requests   Final    NONE Performed at [facility], [place] [address] [place], [place] [address]    Culture (A)  Final    >=100,000 COLONIES/mL ESCHERICHIA COLI Confirmed Extended Spectrum Beta-Lactamase Producer (ESBL).  In bloodstream infections from ESBL organisms, carbapenems are preferred over piperacillin/tazobactam. They are shown to have a lower risk of mortality.    Report Status 07/15/2021 FINAL  Final   Organism ID, Bacteria ESCHERICHIA COLI (A)  Final      Susceptibility   Escherichia coli - MIC*    AMPICILLIN >=32 RESISTANT Resistant     CEFAZOLIN >=64 RESISTANT Resistant     CEFEPIME 4 INTERMEDIATE Intermediate     CEFTRIAXONE >=64 RESISTANT Resistant     CIPROFLOXACIN >=4 RESISTANT Resistant     GENTAMICIN <=1 SENSITIVE Sensitive     IMIPENEM <=0.25 SENSITIVE Sensitive     NITROFURANTOIN <=16 SENSITIVE Sensitive     TRIMETH/SULFA >=320 RESISTANT Resistant     AMPICILLIN/SULBACTAM >=32 RESISTANT Resistant     PIP/TAZO <=4 SENSITIVE Sensitive     * >=100,000 COLONIES/mL ESCHERICHIA COLI     Labs: BNP (last 3 results) No results for input(s): BNP in the last 8760 hours. Basic Metabolic Panel: Recent Labs  Lab 07/11/21 1007 07/12/21 0042 07/12/21 2101 07/13/21 0417 07/14/21 0400 07/15/21 0336  NA 137 134*  --  136 136 136  K 3.5 3.3*  --  3.6 3.9 3.7  CL 111 108  --  107 110 109  CO2 14* 16*  --  19* 19* 18*  GLUCOSE 93 185*  --  82 95 94  BUN 24* 27*  --  _0 CREATININE 1.86* 1.92*  --  1.43* 1.33* 1.35*  CALCIUM 9.4 9.2  --  8.7* 8.4* 8.8*  MG  --   --  2.1  --  1.7 1.9   Liver Function Tests: Recent Labs  Lab 07/10/21 1157 07/11/21 1007 07/13/21 0417  AST _1 ALT _2 ALKPHOS 88 68 51  BILITOT 0.3 0.8 0.6  PROT 6.8 7.9 6.3*  ALBUMIN 4.2 4.1 3.1*   No results for input(s): LIPASE, AMYLASE in the last 168 hours. No results  for input(s): AMMONIA in the last 168 hours. CBC: Recent Labs  Lab 07/10/21 1157 07/11/21 1007 07/13/21 0417 07/14/21 0400 07/15/21 0336  WBC 14.1* 10.2 6.6 6.4 7.4  NEUTROABS 11.2*  --   --   --   --   HGB 10.9* 11.2* 9.9* 9.5* 9.5*  HCT 32.9* 35.9* 31.0* 30.0* 30.3*  MCV 84 90.2 88.3  88.2 87.3  PLT 529* 475* 407* 421* 429*   Cardiac Enzymes: Recent Labs  Lab 07/12/21 0042  CKTOTAL 50   BNP: Invalid input(s): POCBNP CBG: Recent Labs  Lab 07/14/21 0730 07/14/21 1734 07/14/21 2030 07/15/21 0745 07/15/21 1115  GLUCAP 89 108* 161* 95 105*   D-Dimer No results for input(s): DDIMER in the last 72 hours. Hgb A1c Recent Labs    07/12/21 2101  HGBA1C 5.8*   Lipid Profile No results for input(s): CHOL, HDL, LDLCALC, TRIG, CHOLHDL, LDLDIRECT in the last 72 hours. Thyroid function studies Recent Labs    07/13/21 0952  TSH 1.098   Anemia work up No results for input(s): VITAMINB12, FOLATE, FERRITIN, TIBC, IRON, RETICCTPCT in the last 72 hours. Urinalysis    Component Value Date/Time   COLORURINE YELLOW 07/12/2021 0424   APPEARANCEUR HAZY (A) 07/12/2021 0424   LABSPEC 1.020 07/12/2021 0424   PHURINE 5.5 07/12/2021 0424   GLUCOSEU NEGATIVE 07/12/2021 0424   HGBUR NEGATIVE 07/12/2021 0424   BILIRUBINUR SMALL (A) 07/12/2021 0424   BILIRUBINUR Negative 11/13/2020 0738   KETONESUR NEGATIVE 07/12/2021 0424   PROTEINUR TRACE (A) 07/12/2021 0424   UROBILINOGEN 0.2 11/13/2020 0738   UROBILINOGEN 0.2 09/01/2014 0300   NITRITE NEGATIVE 07/12/2021 0424   LEUKOCYTESUR TRACE (A) 07/12/2021 0424   Sepsis Labs Invalid input(s): PROCALCITONIN,  WBC,  LACTICIDVEN Microbiology Recent Results (from the past 240 hour(s))  Resp Panel by RT-PCR (Flu A&B, Covid) Nasopharyngeal Swab     Status: None   Collection Time: 07/11/21 11:51 AM   Specimen: Nasopharyngeal Swab; Nasopharyngeal(NP) swabs in vial transport medium  Result Value Ref Range Status   SARS Coronavirus 2 by RT  PCR NEGATIVE NEGATIVE Final    Comment: (NOTE) SARS-CoV-2 target nucleic acids are NOT DETECTED.  The SARS-CoV-2 RNA is generally detectable in upper respiratory specimens during the acute phase of infection. The lowest concentration of SARS-CoV-2 viral copies this assay can detect is 138 copies/mL. A negative result does not preclude SARS-Cov-2 infection and should not be used as the sole basis for treatment or other patient management decisions. A negative result may occur with  improper specimen collection/handling, submission of specimen other than nasopharyngeal swab, presence of viral mutation(s) within the areas targeted by this assay, and inadequate number of viral copies(<138 copies/mL). A negative result must be combined with clinical observations, patient history, and epidemiological information. The expected result is Negative.  Fact Sheet for Patients:  EntrepreneurPulse.com.au  Fact Sheet for Healthcare Providers:  IncredibleEmployment.be  This test is no t yet approved or cleared by the [place] FDA and  has been authorized for detection and/or diagnosis of SARS-CoV-2 by FDA under an Emergency Use Authorization (EUA). This EUA will remain  in effect (meaning this test can be used) for the duration of the COVID-19 declaration under Section 564(b)(1) of the Act, 21 U.S.C.section 360bbb-3(b)(1), unless the authorization is terminated  or revoked sooner.       Influenza A by PCR NEGATIVE NEGATIVE Final   Influenza B by PCR NEGATIVE NEGATIVE Final    Comment: (NOTE) The Xpert Xpress SARS-CoV-2/FLU/RSV plus assay is intended as an aid in the diagnosis of influenza from Nasopharyngeal swab specimens and should not be used as a sole basis for treatment. Nasal washings and aspirates are unacceptable for Xpert Xpress SARS-CoV-2/FLU/RSV testing.  Fact Sheet for Patients: EntrepreneurPulse.com.au  Fact Sheet for  Healthcare Providers: IncredibleEmployment.be  This test is not yet approved or cleared by the [place] FDA and has  been authorized for detection and/or diagnosis of SARS-CoV-2 by FDA under an Emergency Use Authorization (EUA). This EUA will remain in effect (meaning this test can be used) for the duration of the COVID-19 declaration under Section 564(b)(1) of the Act, 21 U.S.C. section 360bbb-3(b)(1), unless the authorization is terminated or revoked.  Performed at [facility], [place] [address], [place], [place] [address]   Urine Culture     Status: Abnormal   Collection Time: 07/12/21  4:24 AM   Specimen: Urine, Clean Catch  Result Value Ref Range Status   Specimen Description   Final    URINE, CLEAN CATCH Performed at [facility], [place] [address], [place], [place] [address]    Special Requests   Final    NONE Performed at [facility], [place] [address] [place], [place] [address]    Culture (A)  Final    >=100,000 COLONIES/mL ESCHERICHIA COLI Confirmed Extended Spectrum Beta-Lactamase Producer (ESBL).  In bloodstream infections from ESBL organisms, carbapenems are preferred over piperacillin/tazobactam. They are shown to have a lower risk of mortality.    Report Status 07/15/2021 FINAL  Final   Organism ID, Bacteria ESCHERICHIA COLI (A)  Final      Susceptibility   Escherichia coli - MIC*    AMPICILLIN >=32 RESISTANT Resistant     CEFAZOLIN >=64 RESISTANT Resistant     CEFEPIME 4 INTERMEDIATE Intermediate     CEFTRIAXONE >=64 RESISTANT Resistant     CIPROFLOXACIN >=4 RESISTANT Resistant     GENTAMICIN <=1 SENSITIVE Sensitive     IMIPENEM <=0.25 SENSITIVE Sensitive     NITROFURANTOIN <=16 SENSITIVE Sensitive     TRIMETH/SULFA >=320 RESISTANT Resistant     AMPICILLIN/SULBACTAM >=32 RESISTANT Resistant     PIP/TAZO <=4 SENSITIVE Sensitive     * >=100,000 COLONIES/mL ESCHERICHIA COLI     Time  coordinating discharge:  I have spent 35 minutes face to face with the patient and on the ward discussing the patients care, assessment, plan and disposition with other care givers. >50% of the time was devoted counseling the patient about the risks and benefits of treatment/Discharge disposition and coordinating care.   SIGNED:   Damita Lack, MD  [facility] 07/15/2021, 11:17 AM   If 7PM-7AM, please contact night-coverage

## 2021-07-15 NOTE — TOC Progression Note (Signed)
Transition of Care [facility]) - Progression Note    Patient Details  Name: Anna Hoffman MRN: [id number] Date of Birth: 1935-07-13  Transition of Care [facility]) CM/SW Contact  Leeroy Cha, RN Phone Number: 07/15/2021, 8:31 AM  Clinical Narrative:     Transition of Care [facility]) Screening Note   Patient Details  Name: Anna Hoffman Date of Birth: Dec 21, 1934   Transition of Care [facility]) CM/SW Contact:    Leeroy Cha, RN Phone Number: 07/15/2021, 8:31 AM    Transition of Care Department [facility]) has reviewed patient and no TOC needs have been identified at this time. We will continue to monitor patient advancement through interdisciplinary progression rounds. If new patient transition needs arise, please place a TOC consult.          Expected Discharge Plan and Services                                                 Social Determinants of Health (SDOH) Interventions    Readmission Risk Interventions No flowsheet data found.

## 2021-07-15 NOTE — Care Management Important Message (Signed)
Important Message  Patient Details IM Letter given to the Patient. Name: Anna Hoffman MRN: [id number] Date of Birth: 10/27/1934   Medicare Important Message Given:  Yes     Kerin Salen 07/15/2021, 12:58 PM

## 2021-07-15 NOTE — TOC Initial Note (Signed)
Transition of Care [facility]) - Initial/Assessment Note    Patient Details  Name: Anna Hoffman MRN: [id number] Date of Birth: Sep 14, 1934  Transition of Care [facility]) CM/SW Contact:    Leeroy Cha, RN Phone Number: 07/15/2021, 10:54 AM  Clinical Narrative:                  Transition of Care [facility]) Screening Note   Patient Details  Name: Anna Hoffman Date of Birth: 1934-09-05   Transition of Care [facility]) CM/SW Contact:    Leeroy Cha, RN Phone Number: 07/15/2021, 10:54 AM    Transition of Care Department (TOC) has reviewed patient and no TOC needs have been identified at this time. We will continue to monitor patient advancement through interdisciplinary progression rounds. If new patient transition needs arise, please place a TOC consult.  Pt discharge to return to home with hhc for pt and ot [place] contacted patient has had in the past but does not know which agency.  Expected Discharge Plan: [place] Barriers to Discharge: No Barriers Identified   Patient Goals and CMS Choice Patient states their goals for this hospitalization and ongoing recovery are:: to go home CMS Medicare.gov Compare Post Acute Care list provided to:: Patient Choice offered to / list presented to : Patient  Expected Discharge Plan and Services Expected Discharge Plan: [place]   Discharge Planning Services: CM Consult Post Acute Care Choice: [place] arrangements for the past 2 months: Single Family Home Expected Discharge Date: 07/15/21                         HH Arranged: PT, OT HH Agency: [facility] Date [place]: 07/15/21 Time [place]: 1950 Representative spoke with at [place]: [place] Arrangements/Services Living arrangements for the past 2 months: [place] with:: Self Patient language and need for interpreter reviewed:: Yes Do you feel safe going back to  the place where you live?: Yes            Criminal Activity/Legal Involvement Pertinent to Current Situation/Hospitalization: No - Comment as needed  Activities of Daily Living Home Assistive Devices/Equipment: Eyeglasses, Environmental consultant (specify type), Wheelchair, Bedside commode/3-in-1, Shower chair with back ADL Screening (condition at time of admission) Patient's cognitive ability adequate to safely complete daily activities?: Yes Is the patient deaf or have difficulty hearing?: Yes (pt denies but having to repeat most questions to her) Does the patient have difficulty seeing, even when wearing glasses/contacts?: No Does the patient have difficulty concentrating, remembering, or making decisions?: No Patient able to express need for assistance with ADLs?: Yes Does the patient have difficulty dressing or bathing?: Yes Independently performs ADLs?: No Communication: Independent Dressing (OT): Needs assistance Is this a change from baseline?: Pre-admission baseline Grooming: Independent Feeding: Independent Bathing: Needs assistance Is this a change from baseline?: Pre-admission baseline Toileting: Needs assistance Is this a change from baseline?: Pre-admission baseline In/Out Bed: Needs assistance Is this a change from baseline?: Pre-admission baseline Walks in Home: Needs assistance Is this a change from baseline?: Pre-admission baseline Does the patient have difficulty walking or climbing stairs?: Yes Weakness of Legs: Both Weakness of Arms/Hands: Both  Permission Sought/Granted                  Emotional Assessment Appearance:: Appears stated age Attitude/Demeanor/Rapport: Engaged Affect (typically observed): Calm Orientation: : Oriented to Place, Oriented  to Self, Oriented to  Time, Oriented to Situation Alcohol / Substance Use: Not Applicable Psych Involvement: No (comment)  Admission diagnosis:  Dehydration [A35.5] Metabolic acidosis [D32.20] Generalized weakness  [R53.1] AKI (acute kidney injury) ([place]) [N17.9] Normal anion gap metabolic acidosis [U54.27] Nausea and vomiting, unspecified vomiting type [R11.2] Patient Active Problem List   Diagnosis Date Noted   Generalized weakness 01/25/7627   Acute metabolic encephalopathy    Malnutrition of moderate degree 05/24/2021   Sepsis ([place]) 05/23/2021   SIRS (systemic inflammatory response syndrome) ([place]) 05/22/2021   History of CVA (cerebrovascular accident) 05/22/2021   Gait instability 04/24/2021   Seasonal allergic rhinitis due to pollen 04/24/2021   Ascending aorta dilatation ([place]) 04/05/2021   HOCM (hypertrophic obstructive cardiomyopathy) ([place]) 04/04/2021   Physical deconditioning 04/04/2021   Syncope 04/03/2021   Fall at home, initial encounter 03/31/2021   Thyroid nodule 03/31/2021   CAP (community acquired pneumonia) 11/15/2020   Dehydration 11/15/2020   Erosive esophagitis    Hiatal hernia    Hyponatremia 10/10/2020   Acute kidney injury superimposed on chronic kidney disease ([place]) 10/10/2020   Aortic atherosclerosis ([place]) 10/09/2020   Acute renal failure superimposed on stage 3a chronic kidney disease ([place]) 10/09/2020   Hyperkalemia 10/09/2020   Hearing loss 10/09/2020   Prolonged QT interval 10/09/2020   Colitis, acute 08/11/2019   Colitis 07/11/2019   Loose stools 06/07/2019   Herpes zoster without complication 31/51/7616   Other long term (current) drug therapy 08/12/2018   Chronic kidney disease, stage 3a ([place]) 07/19/2018   Hypertensive nephropathy 07/19/2018   Community acquired pneumonia of right upper lobe of lung 11/07/2017   Hypertensive emergency 07/01/2016   Thalamic hemorrhage ([place]) 07/01/2016   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[place])    Benign essential HTN    Type 2 diabetes mellitus with stage 3 chronic kidney disease, without long-term current use of insulin ([place])    Mixed hyperlipidemia    ICH (intracerebral hemorrhage) ([place]) - hypertensive R thalamic  hemorrhage  06/27/2016   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 [address] [place] [address] Phone: [phone] Fax: [phone]     Social Determinants of Health (SDOH) Interventions    Readmission Risk Interventions No flowsheet data found.

## 2021-07-15 NOTE — Consult Note (Signed)
[facility] Inpatient Consult   07/15/2021  Anna Hoffman [facility] 1935-04-18 [id number]  [place] [facility] CM)  Patient was screened for [place] Management services due to high risk score for unplanned readmission. Per review, patient active with embedded chronic care management service at primary provider office.    Plan: Will continue to follow for progression.  Of note, [facility] Care Management services does not replace or interfere with any services that are arranged by inpatient case management or social work.

## 2021-07-15 NOTE — Progress Notes (Signed)
Physical Therapy Treatment Patient Details Name: Anna Hoffman MRN: [id number] DOB: 07-05-1935 Today's Date: 07/15/2021   History of Present Illness 86 y.o. female presenting to ED with N/V and weakness x 2-3 days with mutliple falls resulting in trauma to R shoulder (followed by outpatient PT). Patient found to have SIRS. PMH significant for esophagitis, CKD stage IIIa, hypertension, colitis, hypertrophic cardiomyopathy and intracerebral hemorrhage, diabetes.    PT Comments    Progressing with mobility. Recommend 24/7 supervision/assist at home.    Recommendations for follow up therapy are one component of a multi-disciplinary discharge planning process, led by the attending physician.  Recommendations may be updated based on patient status, additional functional criteria and insurance authorization.  Follow Up Recommendations  Home health PT     Assistance Recommended at Discharge Frequent or constant Supervision/Assistance  Equipment Recommendations  None recommended by PT    Recommendations for Other Services       Precautions / Restrictions Precautions Precautions: Fall Restrictions Weight Bearing Restrictions: No     Mobility  Bed Mobility Overal bed mobility: Needs Assistance Bed Mobility: Supine to Sit     Supine to sit: Supervision          Transfers Overall transfer level: Needs assistance Equipment used: Rolling walker (2 wheels) Transfers: Sit to/from Stand Sit to Stand: Min assist;From elevated surface           General transfer comment: x 2 (once from bed, once from bsc). Assist to power up, stabilize, control descent. Pt tends to have a posterior bias with initial standing and poor eccentric control when sitting    Ambulation/Gait Ambulation/Gait assistance: Min assist Gait Distance (Feet): 20 Feet Assistive device: Rolling walker (2 wheels) Gait Pattern/deviations: Step-through pattern;Decreased stride length       General Gait  Details: Assist to stabilize throughout distance.   Stairs             Wheelchair Mobility    Modified Rankin (Stroke Patients Only)       Balance Overall balance assessment: Needs assistance;History of Falls         Standing balance support: Bilateral upper extremity supported Standing balance-Leahy Scale: Poor                              Cognition Arousal/Alertness: Awake/alert Behavior During Therapy: WFL for tasks assessed/performed Overall Cognitive Status: Within Functional Limits for tasks assessed                                          Exercises      General Comments        Pertinent Vitals/Pain Pain Assessment: Faces Faces Pain Scale: Hurts a little bit Pain Location: knees, R shoulder Pain Descriptors / Indicators: Discomfort;Sore Pain Intervention(s): Monitored during session;Repositioned    Home Living                          Prior Function            PT Goals (current goals can now be found in the care plan section) Progress towards PT goals: Progressing toward goals    Frequency    Min 3X/week      PT Plan Current plan remains appropriate    Co-evaluation  AM-PAC PT "6 Clicks" Mobility   Outcome Measure  Help needed turning from your back to your side while in a flat bed without using bedrails?: None Help needed moving from lying on your back to sitting on the side of a flat bed without using bedrails?: None Help needed moving to and from a bed to a chair (including a wheelchair)?: A Little Help needed standing up from a chair using your arms (e.g., wheelchair or bedside chair)?: A Little Help needed to walk in hospital room?: A Little Help needed climbing 3-5 steps with a railing? : A Lot 6 Click Score: 19    End of Session Equipment Utilized During Treatment: Gait belt Activity Tolerance: Patient tolerated treatment well Patient left: in chair;with call  bell/phone within reach;with chair alarm set   PT Visit Diagnosis: Unsteadiness on feet (R26.81);Muscle weakness (generalized) (M62.81);History of falling (Z91.81)     Time: 7092-9574 PT Time Calculation (min) (ACUTE ONLY): 22 min  Charges:  $Gait Training: 8-22 mins                        Doreatha Massed, PT Acute Rehabilitation  Office: [phone] Pager: [phone]

## 2021-07-15 NOTE — Patient Instructions (Signed)
Social Worker Visit Information  Goals we discussed today:   Goals Addressed             This Visit's Progress    Mobility and Independence Optimized       Timeframe:  Short-Term Goal Priority:  High Start Date:  9.21.22                                             Patient Goals/Self-Care Activities patient will: with the help of her son  - Work with SW to identify long term placement opportunities          Materials Provided: Yes: verbal update regarding placement options provided to [place] by phone  Patient verbalizes understanding of instructions provided today and agrees to view in [place].   Follow Up Plan: SW will follow up with patient by phone over the next 3 days   Daneen Schick, BSW, CDP Social Worker, Certified Dementia Practitioner [place] / [place] [facility] [phone]

## 2021-07-16 ENCOUNTER — Telehealth: Payer: Self-pay

## 2021-07-16 LAB — IRON,TIBC AND FERRITIN PANEL

## 2021-07-16 NOTE — Telephone Encounter (Signed)
Transition Care Management Unsuccessful Follow-up Telephone Call  Date of discharge and from where:  07/15/2021 [facility]   Attempts:  2nd Attempt  Reason for unsuccessful TCM follow-up call:  Left voice message

## 2021-07-16 NOTE — Telephone Encounter (Signed)
Transition Care Management Unsuccessful Follow-up Telephone Call  Date of discharge and from where:  07/16/2021 [facility]   Attempts:  1st Attempt  Reason for unsuccessful TCM follow-up call:  unable to reach pt

## 2021-07-17 ENCOUNTER — Ambulatory Visit: Payer: Medicare Other

## 2021-07-17 ENCOUNTER — Telehealth: Payer: Self-pay

## 2021-07-17 ENCOUNTER — Other Ambulatory Visit (HOSPITAL_COMMUNITY): Payer: Self-pay

## 2021-07-17 DIAGNOSIS — I1310 Hypertensive heart and chronic kidney disease without heart failure, with stage 1 through stage 4 chronic kidney disease, or unspecified chronic kidney disease: Secondary | ICD-10-CM

## 2021-07-17 DIAGNOSIS — N1831 Chronic kidney disease, stage 3a: Secondary | ICD-10-CM

## 2021-07-17 DIAGNOSIS — E1122 Type 2 diabetes mellitus with diabetic chronic kidney disease: Secondary | ICD-10-CM

## 2021-07-17 NOTE — Chronic Care Management (AMB) (Signed)
Chronic Care Management    Social Work Note  07/17/2021 Name: Anna Hoffman MRN: [id number] DOB: Dec 19, 1934  Anna Hoffman is a 86 y.o. year old female who is a primary care patient of Glendale Chard, MD. The CCM team was consulted to assist the patient with chronic disease management and/or care coordination needs related to:  HTN, DM II .   Collaboration with long term care communities by phone  for  case collaboration  in response to provider referral for social work chronic care management and care coordination services.   Consent to Services:  The patient was given information about Chronic Care Management services, agreed to services, and gave verbal consent prior to initiation of services.  Please see initial visit note for detailed documentation.   Patient agreed to services and consent obtained.   Assessment: Review of patient past medical history, allergies, medications, and health status, including review of relevant consultants reports was performed today as part of a comprehensive evaluation and provision of chronic care management and care coordination services.     SDOH (Social Determinants of Health) assessments and interventions performed:    Advanced Directives Status: Not addressed in this encounter.  CCM Care Plan  Allergies  Allergen Reactions   Amoxicillin Diarrhea    Has patient had a PCN reaction causing immediate rash, facial/tongue/throat swelling, SOB or lightheadedness with hypotension: no Has patient had a PCN reaction causing severe rash involving mucus membranes or skin necrosis: no Has patient had a PCN reaction that required hospitalization: no pharmacist consult Has patient had a PCN reaction occurring within the last 10 years: yes If all of the above answers are "NO", then may proceed with Cephalosporin use.    Cefepime     Possible CNS toxicity 05/2021   Ampicillin Rash    - Tolerates Rocephin and Ancef - Remote occurrence; no symptoms of  anaphylaxis or severe cutaneous reaction, and no additional medical attention required     Sulfa Antibiotics Rash    Outpatient Encounter Medications as of 07/17/2021  Medication Sig   acetaminophen (TYLENOL) 325 MG tablet Take 2 tablets (650 mg total) by mouth every 6 (six) hours as needed for mild pain (or Fever >/= 101). (Patient not taking: Reported on 07/15/2021)   amLODipine (NORVASC) 10 MG tablet Take 1 tablet (10 mg total) by mouth daily.   aspirin 81 MG EC tablet Take 1 tablet (81 mg total) by mouth daily. Swallow whole.   Vitamin D3 (VITAMIN D) 25 MCG tablet Take 1 tablet (1,000 Units total) by mouth daily.   diclofenac Sodium (VOLTAREN) 1 % GEL Apply 2 g topically every 12 (twelve) hours to left ankle and right shoulder pain   fexofenadine (ALLEGRA ALLERGY) 180 MG tablet Take 1 tablet (180 mg total) by mouth daily. (Patient not taking: Reported on 07/10/2021)   glucose blood test strip Use as directed to check blood sugars 1 time per day.   glucose monitoring kit (FREESTYLE) monitoring kit Use as directed to check blood sugars 1 time per day dx: e11.22   hydrALAZINE (APRESOLINE) 100 MG tablet Take 1 tablet (100 mg total) by mouth every 8 (eight) hours.   insulin aspart (NOVOLOG) 100 UNIT/ML FlexPen Inject into the skin with meals per sliding scale. 151-200= 2 units, 201-250= 4 units, 251-300= 6 units, 301-350=8 units, 351-400=10 units, >401=10 units and notify MD (Patient taking differently: Inject 2-10 Units into the skin in the morning and at bedtime.)   insulin aspart (NOVOLOG) 100 UNIT/ML injection  Inject 0-9 Units into the skin 3 (three) times daily with meals. (Patient not taking: Reported on 07/10/2021)   Lancets (ONETOUCH DELICA PLUS [id number]) MISC Use to check blood sugar once daily   metoprolol tartrate (LOPRESSOR) 25 MG tablet Take 0.5 tablets (12.5 mg total) by mouth 2 (two) times daily.   pantoprazole (PROTONIX) 40 MG tablet Take 1 tablet (40 mg total) by mouth 2 (two)  times daily.   prochlorperazine (COMPAZINE) 5 MG tablet Take 1 tablet (5 mg total) by mouth every 6 (six) hours as needed for nausea or vomiting. (Patient not taking: Reported on 07/10/2021)   rosuvastatin (CRESTOR) 20 MG tablet Take 1 tablet (20 mg total) by mouth daily.   sucralfate (CARAFATE) 1 GM/10ML suspension Take 10 mLs (1 g total) by mouth 4 (four) times daily -  with meals and at bedtime for 28 days   Facility-Administered Encounter Medications as of 07/17/2021  Medication   cyanocobalamin ((VITAMIN B-12)) injection 1,000 mcg    Patient Active Problem List   Diagnosis Date Noted   Generalized weakness 25/95/6387   Acute metabolic encephalopathy    Malnutrition of moderate degree 05/24/2021   Sepsis ([place]) 05/23/2021   SIRS (systemic inflammatory response syndrome) ([place] [facility]) 05/22/2021   History of CVA (cerebrovascular accident) 05/22/2021   Gait instability 04/24/2021   Seasonal allergic rhinitis due to pollen 04/24/2021   Ascending aorta dilatation ([place]) 04/05/2021   HOCM (hypertrophic obstructive cardiomyopathy) ([place]) 04/04/2021   Physical deconditioning 04/04/2021   Syncope 04/03/2021   Fall at home, initial encounter 03/31/2021   Thyroid nodule 03/31/2021   CAP (community acquired pneumonia) 11/15/2020   Dehydration 11/15/2020   Erosive esophagitis    Hiatal hernia    Hyponatremia 10/10/2020   Acute kidney injury superimposed on chronic kidney disease ([place]) 10/10/2020   Aortic atherosclerosis ([place]) 10/09/2020   Acute renal failure superimposed on stage 3a chronic kidney disease ([place]) 10/09/2020   Hyperkalemia 10/09/2020   Hearing loss 10/09/2020   Prolonged QT interval 10/09/2020   Colitis, acute 08/11/2019   Colitis 07/11/2019   Loose stools 06/07/2019   Herpes zoster without complication 56/43/3295   Other long term (current) drug therapy 08/12/2018   Chronic kidney disease, stage 3a ([place]) 07/19/2018   Hypertensive nephropathy 07/19/2018   Community acquired  pneumonia of right upper lobe of lung 11/07/2017   Hypertensive emergency 07/01/2016   Thalamic hemorrhage ([place]) 07/01/2016   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[place])    Benign essential HTN    Type 2 diabetes mellitus with stage 3 chronic kidney disease, without long-term current use of insulin ([place])    Mixed hyperlipidemia    ICH (intracerebral hemorrhage) ([place]) - hypertensive R thalamic hemorrhage  06/27/2016   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   Retroperitoneal bleed 02/08/2016   Generalized abdominal pain    Gastroenteritis 02/04/2016   Diarrhea 02/04/2016   Hypokalemia 02/04/2016   Incarcerated paraesophageal hernia 09/02/2014   Renal mass, left 09/02/2014   Acute esophagitis 09/02/2014   Gastric outlet obstruction 09/02/2014   Hypertension    Vomiting 09/01/2014    Conditions to be addressed/monitored: HTN and DMII; Level of care concerns  Care Plan : Social Work Care Plan  Updates made by Daneen Schick since 07/17/2021 12:00 AM     Problem: Mobility and Independence      Goal: Mobility and Independence Optimized   Start Date: 04/24/2021  Expected End Date: 06/08/2021  Recent Progress: On track  Priority: High  Note:   Current Barriers:  Chronic disease  management support and education needs related to HTN and DM  ADL IADL limitations and Limited access to caregiver  Social Worker Clinical Goal(s):  patient will work with SW to identify and address any acute and/or chronic care coordination needs related to the self health management of HTN and DM  patient will work with SW to address concerns related to level of care needs  SW Interventions:  Inter-disciplinary care team collaboration (see longitudinal plan of care) Collaboration with Glendale Chard, MD regarding development and update of comprehensive plan of care as evidenced by provider attestation and co-signature Follow up calls placed to both [facility] in [place] and [facility] in [place] to confirm receipt of clinicals and determine if a bed offer can be made Left voice message with both facilities requesting a return call Below indicates updates on all SNF's within [facility] regarding bed availability: [facility] - no long term beds available [place]-  call returned  No beds available [facility]- no long term beds available [facility] - no beds available, over a year wait list [facility]- no beds available [place] - voice message left for [employer] requesting a return call. Received a voice message from Mrs. Barnet Pall stating she would email admission requirements to SW for review [place] at [facility] - no answer no option to leave voice message [place] return call- wait list over a year [place] - no beds available [facility] - left voice message requesting a return call [facility]- no beds available wait list of over 59 people [place] - spoke with Crystal, beds are available but unable to admit due to active COVID cases [facility] - no answer, no option to leave a voice message [place] - spoke with Shirlee Limerick who indicates they do have a bed and the patient must have long term care Medicaid approved prior to admission  Patient Goals/Self-Care Activities patient will: with the help of her son  -  Work with SW to identify long term placement opportunities  Follow Up Plan:  SW will follow up with status of bed offers over the next 3 days       Follow Up Plan: SW will follow up with patient by phone over the next 2 days      Daneen Schick, BSW, CDP Social Worker, Certified Dementia Practitioner [place] [facility] [place] [facility] [phone]

## 2021-07-17 NOTE — Telephone Encounter (Signed)
Transition Care Management Follow-up Telephone Call Date of discharge and from where: 07/15/2021  [facility]  How have you been since you were released from the hospital? Pt reports she is doing so much better.  Any questions or concerns? No  Items Reviewed: Did the pt receive and understand the discharge instructions provided? Yes  Medications obtained and verified? Yes  Other? No  Any new allergies since your discharge? No  Dietary orders reviewed? Yes Do you have support at home? Yes   Home Care and Equipment/Supplies: Were home health services ordered? yes If so, what is the name of the agency? N/a  Has the agency set up a time to come to the patient's home? yes Were any new equipment or medical supplies ordered?  No What is the name of the medical supply agency? N/a Were you able to get the supplies/equipment? not applicable Do you have any questions related to the use of the equipment or supplies? No  Functional Questionnaire: (I = Independent and D = Dependent) ADLs: I/d  Bathing/Dressing- D  Meal Prep- D  Eating- I  Maintaining continence- I/d  Transferring/Ambulation- I/d  Managing Meds- I/d  Follow up appointments reviewed:  PCP Hospital f/u appt confirmed? Yes  Scheduled to see robyn sanders on 07/25/2021 @ [facility] 12:00pm. Specialist Hospital f/u appt confirmed? No  Scheduled to see n/a on n/a @ n/a. Are transportation arrangements needed? No  If their condition worsens, is the pt aware to call PCP or go to the Emergency Dept.? Yes Was the patient provided with contact information for the PCP's office or ED? Yes Was to pt encouraged to call back with questions or concerns? Yes

## 2021-07-18 ENCOUNTER — Other Ambulatory Visit: Payer: Self-pay

## 2021-07-18 ENCOUNTER — Other Ambulatory Visit (HOSPITAL_COMMUNITY): Payer: Self-pay

## 2021-07-18 DIAGNOSIS — G9341 Metabolic encephalopathy: Secondary | ICD-10-CM | POA: Diagnosis not present

## 2021-07-18 DIAGNOSIS — Z79899 Other long term (current) drug therapy: Secondary | ICD-10-CM

## 2021-07-18 DIAGNOSIS — Z8673 Personal history of transient ischemic attack (TIA), and cerebral infarction without residual deficits: Secondary | ICD-10-CM | POA: Diagnosis not present

## 2021-07-18 DIAGNOSIS — M1990 Unspecified osteoarthritis, unspecified site: Secondary | ICD-10-CM | POA: Diagnosis not present

## 2021-07-18 DIAGNOSIS — E876 Hypokalemia: Secondary | ICD-10-CM | POA: Diagnosis not present

## 2021-07-18 DIAGNOSIS — E041 Nontoxic single thyroid nodule: Secondary | ICD-10-CM | POA: Diagnosis not present

## 2021-07-18 DIAGNOSIS — N1831 Chronic kidney disease, stage 3a: Secondary | ICD-10-CM | POA: Diagnosis not present

## 2021-07-18 DIAGNOSIS — E872 Acidosis, unspecified: Secondary | ICD-10-CM | POA: Diagnosis not present

## 2021-07-18 DIAGNOSIS — I10 Essential (primary) hypertension: Secondary | ICD-10-CM | POA: Diagnosis not present

## 2021-07-18 DIAGNOSIS — N390 Urinary tract infection, site not specified: Secondary | ICD-10-CM | POA: Diagnosis not present

## 2021-07-18 DIAGNOSIS — Z794 Long term (current) use of insulin: Secondary | ICD-10-CM | POA: Diagnosis not present

## 2021-07-18 DIAGNOSIS — H9190 Unspecified hearing loss, unspecified ear: Secondary | ICD-10-CM | POA: Diagnosis not present

## 2021-07-18 DIAGNOSIS — E440 Moderate protein-calorie malnutrition: Secondary | ICD-10-CM | POA: Diagnosis not present

## 2021-07-18 DIAGNOSIS — E1122 Type 2 diabetes mellitus with diabetic chronic kidney disease: Secondary | ICD-10-CM | POA: Diagnosis not present

## 2021-07-18 DIAGNOSIS — J301 Allergic rhinitis due to pollen: Secondary | ICD-10-CM | POA: Diagnosis not present

## 2021-07-18 DIAGNOSIS — I700 Atherosclerosis of aorta: Secondary | ICD-10-CM | POA: Diagnosis not present

## 2021-07-18 DIAGNOSIS — Z7982 Long term (current) use of aspirin: Secondary | ICD-10-CM | POA: Diagnosis not present

## 2021-07-18 DIAGNOSIS — N179 Acute kidney failure, unspecified: Secondary | ICD-10-CM | POA: Diagnosis not present

## 2021-07-18 DIAGNOSIS — I421 Obstructive hypertrophic cardiomyopathy: Secondary | ICD-10-CM | POA: Diagnosis not present

## 2021-07-18 DIAGNOSIS — E785 Hyperlipidemia, unspecified: Secondary | ICD-10-CM | POA: Diagnosis not present

## 2021-07-18 DIAGNOSIS — I77810 Thoracic aortic ectasia: Secondary | ICD-10-CM | POA: Diagnosis not present

## 2021-07-18 DIAGNOSIS — E1140 Type 2 diabetes mellitus with diabetic neuropathy, unspecified: Secondary | ICD-10-CM | POA: Diagnosis not present

## 2021-07-18 DIAGNOSIS — Z9181 History of falling: Secondary | ICD-10-CM | POA: Diagnosis not present

## 2021-07-18 MED ORDER — FEXOFENADINE HCL 180 MG PO TABS
180.0000 mg | ORAL_TABLET | Freq: Every day | ORAL | 2 refills | Status: AC
  Filled 2021-07-18: qty 90, 90d supply, fill #0

## 2021-07-18 MED ORDER — ROSUVASTATIN CALCIUM 20 MG PO TABS
20.0000 mg | ORAL_TABLET | Freq: Every day | ORAL | 1 refills | Status: AC
  Filled 2021-07-18: qty 90, 90d supply, fill #0
  Filled 2021-07-19: qty 30, 30d supply, fill #0
  Filled 2021-08-14: qty 30, 30d supply, fill #1

## 2021-07-18 MED ORDER — METOPROLOL TARTRATE 25 MG PO TABS
12.5000 mg | ORAL_TABLET | Freq: Two times a day (BID) | ORAL | 0 refills | Status: DC
  Filled 2021-07-18: qty 60, 60d supply, fill #0
  Filled 2021-07-19: qty 30, 30d supply, fill #0
  Filled 2021-07-19: qty 60, 60d supply, fill #0
  Filled 2021-08-14: qty 30, 30d supply, fill #1

## 2021-07-19 ENCOUNTER — Other Ambulatory Visit (HOSPITAL_COMMUNITY): Payer: Self-pay

## 2021-07-19 ENCOUNTER — Ambulatory Visit: Payer: Self-pay

## 2021-07-19 ENCOUNTER — Ambulatory Visit: Payer: Medicare Other

## 2021-07-19 DIAGNOSIS — I1310 Hypertensive heart and chronic kidney disease without heart failure, with stage 1 through stage 4 chronic kidney disease, or unspecified chronic kidney disease: Secondary | ICD-10-CM

## 2021-07-19 DIAGNOSIS — E1122 Type 2 diabetes mellitus with diabetic chronic kidney disease: Secondary | ICD-10-CM

## 2021-07-19 NOTE — Chronic Care Management (AMB) (Signed)
Chronic Care Management    Social Work Note  07/19/2021 Name: Anna Hoffman MRN: [id number] DOB: July 09, 1935  Anna Hoffman is a 86 y.o. year old female who is a primary care patient of Glendale Chard, MD. The CCM team was consulted to assist the patient with chronic disease management and/or care coordination needs related to:  DM II, HTN .   Engaged with patients son Anna Hoffman by phone  for follow up visit in response to provider referral for social work chronic care management and care coordination services.   Consent to Services:  The patient was given information about Chronic Care Management services, agreed to services, and gave verbal consent prior to initiation of services.  Please see initial visit note for detailed documentation.   Patient agreed to services and consent obtained.   Assessment: Review of patient past medical history, allergies, medications, and health status, including review of relevant consultants reports was performed today as part of a comprehensive evaluation and provision of chronic care management and care coordination services.     SDOH (Social Determinants of Health) assessments and interventions performed:    Advanced Directives Status: Not addressed in this encounter.  CCM Care Plan  Allergies  Allergen Reactions   Amoxicillin Diarrhea    Has patient had a PCN reaction causing immediate rash, facial/tongue/throat swelling, SOB or lightheadedness with hypotension: no Has patient had a PCN reaction causing severe rash involving mucus membranes or skin necrosis: no Has patient had a PCN reaction that required hospitalization: no pharmacist consult Has patient had a PCN reaction occurring within the last 10 years: yes If all of the above answers are "NO", then may proceed with Cephalosporin use.    Cefepime     Possible CNS toxicity 05/2021   Ampicillin Rash    - Tolerates Rocephin and Ancef - Remote occurrence; no symptoms of anaphylaxis or severe  cutaneous reaction, and no additional medical attention required     Sulfa Antibiotics Rash    Outpatient Encounter Medications as of 07/19/2021  Medication Sig   acetaminophen (TYLENOL) 325 MG tablet Take 2 tablets (650 mg total) by mouth every 6 (six) hours as needed for mild pain (or Fever >/= 101). (Patient not taking: Reported on 07/15/2021)   amLODipine (NORVASC) 10 MG tablet Take 1 tablet (10 mg total) by mouth daily.   aspirin 81 MG EC tablet Take 1 tablet (81 mg total) by mouth daily. Swallow whole.   Vitamin D3 (VITAMIN D) 25 MCG tablet Take 1 tablet (1,000 Units total) by mouth daily.   diclofenac Sodium (VOLTAREN) 1 % GEL Apply 2 g topically every 12 (twelve) hours to left ankle and right shoulder pain   fexofenadine (ALLEGRA ALLERGY) 180 MG tablet Take 1 tablet (180 mg total) by mouth daily.   glucose blood test strip Use as directed to check blood sugars 1 time per day.   glucose monitoring kit (FREESTYLE) monitoring kit Use as directed to check blood sugars 1 time per day dx: e11.22   hydrALAZINE (APRESOLINE) 100 MG tablet Take 1 tablet (100 mg total) by mouth every 8 (eight) hours.   insulin aspart (NOVOLOG) 100 UNIT/ML FlexPen Inject into the skin with meals per sliding scale. 151-200= 2 units, 201-250= 4 units, 251-300= 6 units, 301-350=8 units, 351-400=10 units, >401=10 units and notify MD (Patient taking differently: Inject 2-10 Units into the skin in the morning and at bedtime.)   insulin aspart (NOVOLOG) 100 UNIT/ML injection Inject 0-9 Units into the skin 3 (three)  times daily with meals. (Patient not taking: Reported on 07/10/2021)   Lancets (ONETOUCH DELICA PLUS [id number]) MISC Use to check blood sugar once daily   metoprolol tartrate (LOPRESSOR) 25 MG tablet Take 0.5 tablets (12.5 mg total) by mouth 2 (two) times daily.   pantoprazole (PROTONIX) 40 MG tablet Take 1 tablet (40 mg total) by mouth 2 (two) times daily.   prochlorperazine (COMPAZINE) 5 MG tablet Take 1  tablet (5 mg total) by mouth every 6 (six) hours as needed for nausea or vomiting. (Patient not taking: Reported on 07/10/2021)   rosuvastatin (CRESTOR) 20 MG tablet Take 1 tablet (20 mg total) by mouth daily.   sucralfate (CARAFATE) 1 GM/10ML suspension Take 10 mLs (1 g total) by mouth 4 (four) times daily -  with meals and at bedtime for 28 days   Facility-Administered Encounter Medications as of 07/19/2021  Medication   cyanocobalamin ((VITAMIN B-12)) injection 1,000 mcg    Patient Active Problem List   Diagnosis Date Noted   Generalized weakness 69/45/0388   Acute metabolic encephalopathy    Malnutrition of moderate degree 05/24/2021   Sepsis ([place]) 05/23/2021   SIRS (systemic inflammatory response syndrome) ([place]) 05/22/2021   History of CVA (cerebrovascular accident) 05/22/2021   Gait instability 04/24/2021   Seasonal allergic rhinitis due to pollen 04/24/2021   Ascending aorta dilatation ([place]) 04/05/2021   HOCM (hypertrophic obstructive cardiomyopathy) ([place]) 04/04/2021   Physical deconditioning 04/04/2021   Syncope 04/03/2021   Fall at home, initial encounter 03/31/2021   Thyroid nodule 03/31/2021   CAP (community acquired pneumonia) 11/15/2020   Dehydration 11/15/2020   Erosive esophagitis    Hiatal hernia    Hyponatremia 10/10/2020   Acute kidney injury superimposed on chronic kidney disease ([place]) 10/10/2020   Aortic atherosclerosis ([place]) 10/09/2020   Acute renal failure superimposed on stage 3a chronic kidney disease ([place]) 10/09/2020   Hyperkalemia 10/09/2020   Hearing loss 10/09/2020   Prolonged QT interval 10/09/2020   Colitis, acute 08/11/2019   Colitis 07/11/2019   Loose stools 06/07/2019   Herpes zoster without complication 82/80/0349   Other long term (current) drug therapy 08/12/2018   Chronic kidney disease, stage 3a ([place]) 07/19/2018   Hypertensive nephropathy 07/19/2018   Community acquired pneumonia of right upper lobe of lung 11/07/2017   Hypertensive  emergency 07/01/2016   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[place])    Benign essential HTN    Type 2 diabetes mellitus with stage 3 chronic kidney disease, without long-term current use of insulin ([place])    Mixed hyperlipidemia    ICH (intracerebral hemorrhage) ([place]) - hypertensive R thalamic hemorrhage  06/27/2016   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   Retroperitoneal bleed 02/08/2016   Generalized abdominal pain    Gastroenteritis 02/04/2016   Diarrhea 02/04/2016   Hypokalemia 02/04/2016   Incarcerated paraesophageal hernia 09/02/2014   Renal mass, left 09/02/2014   Acute esophagitis 09/02/2014   Gastric outlet obstruction 09/02/2014   Hypertension    Vomiting 09/01/2014    Conditions to be addressed/monitored: HTN and DMII; Level of care concerns  Care Plan : Social Work Care Plan  Updates made by Anna Hoffman since 07/19/2021 12:00 AM     Problem: Mobility and Independence      Goal: Mobility and Independence Optimized   Start Date: 04/24/2021  Expected End Date: 06/08/2021  Recent Progress: On track  Priority: High  Note:   Current Barriers:  Chronic disease management support and education needs related to HTN  and DM  ADL IADL limitations and Limited access to caregiver  Social Worker Clinical Goal(s):  patient will work with SW to identify and address any acute and/or chronic care coordination needs related to the self health management of HTN and DM  patient will work with SW to address concerns related to level of care needs  SW Interventions:  Inter-disciplinary care team collaboration (see longitudinal plan of care) Collaboration with Glendale Chard, MD regarding development and update of comprehensive plan of care as evidenced by provider attestation and co-signature Follow up calls placed to both [facility] in [place] and [facility] in [place] to confirm receipt of clinicals and determine if a bed offer  can be made Left voice message with both facilities requesting a return call Outbound call placed to the patients son Anna Hoffman to discuss goal progression Discussed SW has attempted to contact [facility] and [facility] requesting follow up on possible bed offers Advised of [facility] and [facility] having beds available - Anna Hoffman wishes to avoid these facilities at this time Discussed plans for SW to follow up with patients primary care provider on Monday to determine if SNF is appropriate level of care or if ALF or a family care home may be beneficial to the patient Below indicates updates on all SNF's within [facility] regarding bed availability: [facility] - no long term beds available [place]-  call returned  No beds available [facility]- no long term beds available [facility] - no beds available, over a year wait list [facility]- no beds available [place] - voice message left for [employer] requesting a return call. Received a voice message from Mrs. Barnet Pall stating she would email admission requirements to SW for review [place] at [facility] - no answer no option to leave voice message [place] return call- wait list over a year [place] - no beds available [facility] - left voice message requesting a return call [facility]- no beds available wait list of over 15 people [place] - spoke with Crystal, beds are available but unable to admit due to active COVID cases [facility] - no answer, no option to leave a voice message [place] - spoke with Shirlee Limerick who indicates they do have a bed and the patient must have long term care Medicaid approved prior to admission  Patient Goals/Self-Care Activities patient will: with the help of her son  -  Work with SW to identify long term placement opportunities  Follow Up Plan:  SW will follow up with status of bed offers over the next 3 days       Follow Up Plan: SW will  follow up with patient by phone over the next 3 days.      Anna Hoffman, BSW, CDP Social Worker, Certified Dementia Practitioner [place] [facility] [place] [facility] [phone]

## 2021-07-19 NOTE — Patient Instructions (Addendum)
Social Worker Visit Information  Goals we discussed today:  Patient Goals/Self-Care Activities patient will: with the help of her son  -  Work with SW to identify long term placement opportunities  Materials Provided: Verbal education about status of placement provided by phone  Patient verbalizes understanding of instructions provided today and agrees to view in [place].   Follow Up Plan: SW will follow up with patient by phone over the next 3 days   Daneen Schick, BSW, CDP Social Worker, Certified Dementia Practitioner [place] [facility] [place] [facility] [phone]

## 2021-07-19 NOTE — Chronic Care Management (AMB) (Signed)
Chronic Care Management    Social Work Note  07/19/2021 Name: Anna Hoffman MRN: [id number] DOB: Feb 28, 1935  Anna Hoffman is a 86 y.o. year old female who is a primary care patient of Glendale Chard, MD. The CCM team was consulted to assist the patient with chronic disease management and/or care coordination needs related to:  HTN, DM II .   Engaged with patients son [name] by phone  for follow up visit in response to provider referral for social work chronic care management and care coordination services.   Consent to Services:  The patient was given information about Chronic Care Management services, agreed to services, and gave verbal consent prior to initiation of services.  Please see initial visit note for detailed documentation.   Patient agreed to services and consent obtained.   Assessment: Review of patient past medical history, allergies, medications, and health status, including review of relevant consultants reports was performed today as part of a comprehensive evaluation and provision of chronic care management and care coordination services.     SDOH (Social Determinants of Health) assessments and interventions performed:    Advanced Directives Status: Not addressed in this encounter.  CCM Care Plan  Allergies  Allergen Reactions   Amoxicillin Diarrhea    Has patient had a PCN reaction causing immediate rash, facial/tongue/throat swelling, SOB or lightheadedness with hypotension: no Has patient had a PCN reaction causing severe rash involving mucus membranes or skin necrosis: no Has patient had a PCN reaction that required hospitalization: no pharmacist consult Has patient had a PCN reaction occurring within the last 10 years: yes If all of the above answers are "NO", then may proceed with Cephalosporin use.    Cefepime     Possible CNS toxicity 05/2021   Ampicillin Rash    - Tolerates Rocephin and Ancef - Remote occurrence; no symptoms of anaphylaxis or severe  cutaneous reaction, and no additional medical attention required     Sulfa Antibiotics Rash    Outpatient Encounter Medications as of 07/19/2021  Medication Sig   acetaminophen (TYLENOL) 325 MG tablet Take 2 tablets (650 mg total) by mouth every 6 (six) hours as needed for mild pain (or Fever >/= 101). (Patient not taking: Reported on 07/15/2021)   amLODipine (NORVASC) 10 MG tablet Take 1 tablet (10 mg total) by mouth daily.   aspirin 81 MG EC tablet Take 1 tablet (81 mg total) by mouth daily. Swallow whole.   Vitamin D3 (VITAMIN D) 25 MCG tablet Take 1 tablet (1,000 Units total) by mouth daily.   diclofenac Sodium (VOLTAREN) 1 % GEL Apply 2 g topically every 12 (twelve) hours to left ankle and right shoulder pain   fexofenadine (ALLEGRA ALLERGY) 180 MG tablet Take 1 tablet (180 mg total) by mouth daily.   glucose blood test strip Use as directed to check blood sugars 1 time per day.   glucose monitoring kit (FREESTYLE) monitoring kit Use as directed to check blood sugars 1 time per day dx: e11.22   hydrALAZINE (APRESOLINE) 100 MG tablet Take 1 tablet (100 mg total) by mouth every 8 (eight) hours.   insulin aspart (NOVOLOG) 100 UNIT/ML FlexPen Inject into the skin with meals per sliding scale. 151-200= 2 units, 201-250= 4 units, 251-300= 6 units, 301-350=8 units, 351-400=10 units, >401=10 units and notify MD (Patient taking differently: Inject 2-10 Units into the skin in the morning and at bedtime.)   insulin aspart (NOVOLOG) 100 UNIT/ML injection Inject 0-9 Units into the skin 3 (three)  times daily with meals. (Patient not taking: Reported on 07/10/2021)   Lancets (ONETOUCH DELICA PLUS [id number]) MISC Use to check blood sugar once daily   metoprolol tartrate (LOPRESSOR) 25 MG tablet Take 0.5 tablets (12.5 mg total) by mouth 2 (two) times daily.   pantoprazole (PROTONIX) 40 MG tablet Take 1 tablet (40 mg total) by mouth 2 (two) times daily.   prochlorperazine (COMPAZINE) 5 MG tablet Take 1  tablet (5 mg total) by mouth every 6 (six) hours as needed for nausea or vomiting. (Patient not taking: Reported on 07/10/2021)   rosuvastatin (CRESTOR) 20 MG tablet Take 1 tablet (20 mg total) by mouth daily.   sucralfate (CARAFATE) 1 GM/10ML suspension Take 10 mLs (1 g total) by mouth 4 (four) times daily -  with meals and at bedtime for 28 days   Facility-Administered Encounter Medications as of 07/19/2021  Medication   cyanocobalamin ((VITAMIN B-12)) injection 1,000 mcg    Patient Active Problem List   Diagnosis Date Noted   Generalized weakness 69/45/0388   Acute metabolic encephalopathy    Malnutrition of moderate degree 05/24/2021   Sepsis ([place]) 05/23/2021   SIRS (systemic inflammatory response syndrome) ([place]) 05/22/2021   History of CVA (cerebrovascular accident) 05/22/2021   Gait instability 04/24/2021   Seasonal allergic rhinitis due to pollen 04/24/2021   Ascending aorta dilatation ([place]) 04/05/2021   HOCM (hypertrophic obstructive cardiomyopathy) ([place]) 04/04/2021   Physical deconditioning 04/04/2021   Syncope 04/03/2021   Fall at home, initial encounter 03/31/2021   Thyroid nodule 03/31/2021   CAP (community acquired pneumonia) 11/15/2020   Dehydration 11/15/2020   Erosive esophagitis    Hiatal hernia    Hyponatremia 10/10/2020   Acute kidney injury superimposed on chronic kidney disease ([place]) 10/10/2020   Aortic atherosclerosis ([place]) 10/09/2020   Acute renal failure superimposed on stage 3a chronic kidney disease ([place]) 10/09/2020   Hyperkalemia 10/09/2020   Hearing loss 10/09/2020   Prolonged QT interval 10/09/2020   Colitis, acute 08/11/2019   Colitis 07/11/2019   Loose stools 06/07/2019   Herpes zoster without complication 82/80/0349   Other long term (current) drug therapy 08/12/2018   Chronic kidney disease, stage 3a ([place]) 07/19/2018   Hypertensive nephropathy 07/19/2018   Community acquired pneumonia of right upper lobe of lung 11/07/2017   Hypertensive  emergency 07/01/2016   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[place])    Benign essential HTN    Type 2 diabetes mellitus with stage 3 chronic kidney disease, without long-term current use of insulin ([place])    Mixed hyperlipidemia    ICH (intracerebral hemorrhage) ([place]) - hypertensive R thalamic hemorrhage  06/27/2016   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   Retroperitoneal bleed 02/08/2016   Generalized abdominal pain    Gastroenteritis 02/04/2016   Diarrhea 02/04/2016   Hypokalemia 02/04/2016   Incarcerated paraesophageal hernia 09/02/2014   Renal mass, left 09/02/2014   Acute esophagitis 09/02/2014   Gastric outlet obstruction 09/02/2014   Hypertension    Vomiting 09/01/2014    Conditions to be addressed/monitored: HTN and DMII; Level of care concerns  Care Plan : Social Work Care Plan  Updates made by Daneen Schick since 07/19/2021 12:00 AM     Problem: Mobility and Independence      Goal: Mobility and Independence Optimized   Start Date: 04/24/2021  Expected End Date: 06/08/2021  Recent Progress: On track  Priority: High  Note:   Current Barriers:  Chronic disease management support and education needs related to HTN  and DM  ADL IADL limitations and Limited access to caregiver  Social Worker Clinical Goal(s):  patient will work with SW to identify and address any acute and/or chronic care coordination needs related to the self health management of HTN and DM  patient will work with SW to address concerns related to level of care needs  SW Interventions:  Inter-disciplinary care team collaboration (see longitudinal plan of care) Collaboration with Glendale Chard, MD regarding development and update of comprehensive plan of care as evidenced by provider attestation and co-signature [facility]bound call received from [facility] who indicates they are unable to accept a patient who has Medicaid pending - Sheree reports she  looked up the patients type of Medicaid in her database and determined the patient has Medicaid [id number] which covers Part B premium Discussed the family did recently apply for long term care Medicaid and received a letter indicating the application was closed due to the patient already having full coverage Determined if the family would like to proceed with placement at [facility] they will have to move in as private pay with intention to switch to Medicaid once the application is approved. Sheree indicates if the patient is approved for long-term care Medicaid effective the move in date the family would receive reimbursement but she cannot guarantee what Medicaid will authorize Advised by Bed[place] Person the cost of private pay would be $274/day which would mean $8,223 due at move in for one month of rent - discussed SW will contact the patients son to provide an update and return Sheree's call if the family is able to move forward with a private pay admission Successful outbound call placed to the patients son [name] to provide above information Provided active listening over the frustration with DSS filing application incorrectly [employer] he has the option to re-apply in person to discuss dissatisfaction with how the previous application was handled and to request this new application be expedited Determined [name] will complete a paper application at home and visit DSS in person on Monday 12/19 to submit and complain re:  previous application outcome Provided [name] with the patients current FL2 to submit with the long term Medicaid application in hopes of expediting the determination Discussed SW will be out of the office beginning the afternoon of 12/19 and will return 12/27 - SW will ask covering SW to contact [place] early next week to assist as needed Below indicates updates on all SNF's within [facility] regarding bed availability: [facility] - no long term beds available [place]-   call returned  No beds available [facility]- no long term beds available [facility] - no beds available, over a year wait list [facility]- no beds available [place] - voice message left for [facility] [facility] [facility] requesting a return call. Received a voice message from Mrs. Barnet Pall stating she would email admission requirements to SW for review [place] at [facility] - no answer no option to leave voice message [place] return call- wait list over a year [place] - no beds available [facility]  - left voice message requesting a return call [facility]- no beds available wait list of over 65 people [place] - spoke with [place], beds are available but unable to admit due to active COVID cases [facility] - no answer, no option to leave a voice message [place] - spoke with Shirlee Limerick who indicates they do have a bed and the patient must have long term care  Medicaid approved prior to admission  Patient Goals/Self-Care Activities patient will: with the help of her son  -  Work with SW to identify long term placement opportunities  Follow Up Plan:  SW will follow up with patient and her son over the next 7 days       Follow Up Plan:  A member of the care management team will follow up with the patient and her son over the next 7 days.      Daneen Schick, BSW, CDP Social Worker, Certified Dementia Practitioner [place] [facility] [place] [facility] [phone]

## 2021-07-22 ENCOUNTER — Other Ambulatory Visit (HOSPITAL_COMMUNITY): Payer: Self-pay

## 2021-07-22 ENCOUNTER — Telehealth: Payer: Medicare Other

## 2021-07-24 ENCOUNTER — Telehealth: Payer: Medicare Other | Admitting: Licensed Clinical Social Worker

## 2021-07-24 ENCOUNTER — Ambulatory Visit: Payer: Self-pay | Admitting: Licensed Clinical Social Worker

## 2021-07-24 DIAGNOSIS — M1990 Unspecified osteoarthritis, unspecified site: Secondary | ICD-10-CM | POA: Diagnosis not present

## 2021-07-24 DIAGNOSIS — I700 Atherosclerosis of aorta: Secondary | ICD-10-CM | POA: Diagnosis not present

## 2021-07-24 DIAGNOSIS — N1831 Chronic kidney disease, stage 3a: Secondary | ICD-10-CM | POA: Diagnosis not present

## 2021-07-24 DIAGNOSIS — E1122 Type 2 diabetes mellitus with diabetic chronic kidney disease: Secondary | ICD-10-CM | POA: Diagnosis not present

## 2021-07-24 DIAGNOSIS — F3289 Other specified depressive episodes: Secondary | ICD-10-CM

## 2021-07-24 DIAGNOSIS — I421 Obstructive hypertrophic cardiomyopathy: Secondary | ICD-10-CM | POA: Diagnosis not present

## 2021-07-24 DIAGNOSIS — I10 Essential (primary) hypertension: Secondary | ICD-10-CM | POA: Diagnosis not present

## 2021-07-24 DIAGNOSIS — E785 Hyperlipidemia, unspecified: Secondary | ICD-10-CM | POA: Diagnosis not present

## 2021-07-24 DIAGNOSIS — G9341 Metabolic encephalopathy: Secondary | ICD-10-CM | POA: Diagnosis not present

## 2021-07-24 DIAGNOSIS — N390 Urinary tract infection, site not specified: Secondary | ICD-10-CM | POA: Diagnosis not present

## 2021-07-24 DIAGNOSIS — E1140 Type 2 diabetes mellitus with diabetic neuropathy, unspecified: Secondary | ICD-10-CM | POA: Diagnosis not present

## 2021-07-24 DIAGNOSIS — Z9181 History of falling: Secondary | ICD-10-CM | POA: Diagnosis not present

## 2021-07-24 DIAGNOSIS — Z8673 Personal history of transient ischemic attack (TIA), and cerebral infarction without residual deficits: Secondary | ICD-10-CM | POA: Diagnosis not present

## 2021-07-24 DIAGNOSIS — I77810 Thoracic aortic ectasia: Secondary | ICD-10-CM | POA: Diagnosis not present

## 2021-07-24 DIAGNOSIS — Z7982 Long term (current) use of aspirin: Secondary | ICD-10-CM | POA: Diagnosis not present

## 2021-07-24 DIAGNOSIS — H9190 Unspecified hearing loss, unspecified ear: Secondary | ICD-10-CM | POA: Diagnosis not present

## 2021-07-24 DIAGNOSIS — N179 Acute kidney failure, unspecified: Secondary | ICD-10-CM | POA: Diagnosis not present

## 2021-07-24 DIAGNOSIS — E876 Hypokalemia: Secondary | ICD-10-CM | POA: Diagnosis not present

## 2021-07-24 DIAGNOSIS — E440 Moderate protein-calorie malnutrition: Secondary | ICD-10-CM | POA: Diagnosis not present

## 2021-07-24 DIAGNOSIS — E872 Acidosis, unspecified: Secondary | ICD-10-CM | POA: Diagnosis not present

## 2021-07-24 DIAGNOSIS — Z794 Long term (current) use of insulin: Secondary | ICD-10-CM | POA: Diagnosis not present

## 2021-07-24 DIAGNOSIS — E041 Nontoxic single thyroid nodule: Secondary | ICD-10-CM | POA: Diagnosis not present

## 2021-07-24 DIAGNOSIS — J301 Allergic rhinitis due to pollen: Secondary | ICD-10-CM | POA: Diagnosis not present

## 2021-07-25 ENCOUNTER — Inpatient Hospital Stay: Payer: Medicare Other | Admitting: Internal Medicine

## 2021-07-26 DIAGNOSIS — J301 Allergic rhinitis due to pollen: Secondary | ICD-10-CM | POA: Diagnosis not present

## 2021-07-26 DIAGNOSIS — E440 Moderate protein-calorie malnutrition: Secondary | ICD-10-CM | POA: Diagnosis not present

## 2021-07-26 DIAGNOSIS — G9341 Metabolic encephalopathy: Secondary | ICD-10-CM | POA: Diagnosis not present

## 2021-07-26 DIAGNOSIS — I700 Atherosclerosis of aorta: Secondary | ICD-10-CM | POA: Diagnosis not present

## 2021-07-26 DIAGNOSIS — H9190 Unspecified hearing loss, unspecified ear: Secondary | ICD-10-CM | POA: Diagnosis not present

## 2021-07-26 DIAGNOSIS — N179 Acute kidney failure, unspecified: Secondary | ICD-10-CM | POA: Diagnosis not present

## 2021-07-26 DIAGNOSIS — I77810 Thoracic aortic ectasia: Secondary | ICD-10-CM | POA: Diagnosis not present

## 2021-07-26 DIAGNOSIS — M1990 Unspecified osteoarthritis, unspecified site: Secondary | ICD-10-CM | POA: Diagnosis not present

## 2021-07-26 DIAGNOSIS — E1122 Type 2 diabetes mellitus with diabetic chronic kidney disease: Secondary | ICD-10-CM | POA: Diagnosis not present

## 2021-07-26 DIAGNOSIS — E041 Nontoxic single thyroid nodule: Secondary | ICD-10-CM | POA: Diagnosis not present

## 2021-07-26 DIAGNOSIS — E872 Acidosis, unspecified: Secondary | ICD-10-CM | POA: Diagnosis not present

## 2021-07-26 DIAGNOSIS — N1831 Chronic kidney disease, stage 3a: Secondary | ICD-10-CM | POA: Diagnosis not present

## 2021-07-26 DIAGNOSIS — N390 Urinary tract infection, site not specified: Secondary | ICD-10-CM | POA: Diagnosis not present

## 2021-07-26 DIAGNOSIS — E876 Hypokalemia: Secondary | ICD-10-CM | POA: Diagnosis not present

## 2021-07-26 DIAGNOSIS — Z794 Long term (current) use of insulin: Secondary | ICD-10-CM | POA: Diagnosis not present

## 2021-07-26 DIAGNOSIS — E1140 Type 2 diabetes mellitus with diabetic neuropathy, unspecified: Secondary | ICD-10-CM | POA: Diagnosis not present

## 2021-07-26 DIAGNOSIS — Z8673 Personal history of transient ischemic attack (TIA), and cerebral infarction without residual deficits: Secondary | ICD-10-CM | POA: Diagnosis not present

## 2021-07-26 DIAGNOSIS — Z9181 History of falling: Secondary | ICD-10-CM | POA: Diagnosis not present

## 2021-07-26 DIAGNOSIS — I10 Essential (primary) hypertension: Secondary | ICD-10-CM | POA: Diagnosis not present

## 2021-07-26 DIAGNOSIS — I421 Obstructive hypertrophic cardiomyopathy: Secondary | ICD-10-CM | POA: Diagnosis not present

## 2021-07-26 DIAGNOSIS — E785 Hyperlipidemia, unspecified: Secondary | ICD-10-CM | POA: Diagnosis not present

## 2021-07-26 DIAGNOSIS — Z7982 Long term (current) use of aspirin: Secondary | ICD-10-CM | POA: Diagnosis not present

## 2021-07-30 ENCOUNTER — Telehealth: Payer: Self-pay

## 2021-07-30 ENCOUNTER — Ambulatory Visit: Payer: Medicare Other

## 2021-07-30 DIAGNOSIS — N1831 Chronic kidney disease, stage 3a: Secondary | ICD-10-CM

## 2021-07-30 DIAGNOSIS — E1122 Type 2 diabetes mellitus with diabetic chronic kidney disease: Secondary | ICD-10-CM

## 2021-07-30 NOTE — Telephone Encounter (Signed)
°  Care Management   Follow Up Note   07/30/2021 Name: Anna Hoffman MRN: [id number] DOB: 07-Mar-1935   Referred by: Glendale Chard, MD Reason for referral : Chronic Care Management (Unsuccessful call)   An unsuccessful telephone outreach was attempted today. The patient was referred to the case management team for assistance with care management and care coordination.   Follow Up Plan: The care management team will reach out to the patient again over the next 10 days.   Daneen Schick, BSW, CDP Social Worker, Certified Dementia Practitioner [place] [facility] [place] [facility] [phone]

## 2021-07-30 NOTE — Patient Instructions (Signed)
Social Worker Visit Information  Goals we discussed today:   Goals Addressed             This Visit's Progress    Mobility and Independence Optimized       Timeframe:  Short-Term Goal Priority:  High Start Date:  9.21.22                                             Patient Goals/Self-Care Activities: patient will: with the help of her son  - Work with SW to identify long term placement opportunities          Patient verbalizes understanding of instructions provided today and agrees to view in [place].   Follow Up Plan:  SW will follow up with placement options over the next 48 hours   Daneen Schick, BSW, CDP Social Worker, Certified Dementia Practitioner [place] [facility] [place] [facility] [phone]

## 2021-07-30 NOTE — Chronic Care Management (AMB) (Signed)
Chronic Care Management    Social Work Note  07/30/2021 Name: Anna Hoffman MRN: [id number] DOB: 1935-01-28  Anna Hoffman is a 86 y.o. year old female who is a primary care patient of Glendale Chard, MD. The CCM team was consulted to assist the patient with chronic disease management and/or care coordination needs related to:  HTN, DM II .   Engaged with patients son [name] by phone  for follow up visit in response to provider referral for social work chronic care management and care coordination services.   Consent to Services:  The patient was given information about Chronic Care Management services, agreed to services, and gave verbal consent prior to initiation of services.  Please see initial visit note for detailed documentation.   Patient agreed to services and consent obtained.   Assessment: Review of patient past medical history, allergies, medications, and health status, including review of relevant consultants reports was performed today as part of a comprehensive evaluation and provision of chronic care management and care coordination services.     SDOH (Social Determinants of Health) assessments and interventions performed:    Advanced Directives Status: Not addressed in this encounter.  CCM Care Plan  Allergies  Allergen Reactions   Amoxicillin Diarrhea    Has patient had a PCN reaction causing immediate rash, facial/tongue/throat swelling, SOB or lightheadedness with hypotension: no Has patient had a PCN reaction causing severe rash involving mucus membranes or skin necrosis: no Has patient had a PCN reaction that required hospitalization: no pharmacist consult Has patient had a PCN reaction occurring within the last 10 years: yes If all of the above answers are "NO", then may proceed with Cephalosporin use.    Cefepime     Possible CNS toxicity 05/2021   Ampicillin Rash    - Tolerates Rocephin and Ancef - Remote occurrence; no symptoms of anaphylaxis or severe  cutaneous reaction, and no additional medical attention required     Sulfa Antibiotics Rash    Outpatient Encounter Medications as of 07/30/2021  Medication Sig   acetaminophen (TYLENOL) 325 MG tablet Take 2 tablets (650 mg total) by mouth every 6 (six) hours as needed for mild pain (or Fever >/= 101). (Patient not taking: Reported on 07/15/2021)   amLODipine (NORVASC) 10 MG tablet Take 1 tablet (10 mg total) by mouth daily.   aspirin 81 MG EC tablet Take 1 tablet (81 mg total) by mouth daily. Swallow whole.   Vitamin D3 (VITAMIN D) 25 MCG tablet Take 1 tablet (1,000 Units total) by mouth daily.   diclofenac Sodium (VOLTAREN) 1 % GEL Apply 2 g topically every 12 (twelve) hours to left ankle and right shoulder pain   fexofenadine (ALLEGRA ALLERGY) 180 MG tablet Take 1 tablet (180 mg total) by mouth daily.   glucose blood test strip Use as directed to check blood sugars 1 time per day.   glucose monitoring kit (FREESTYLE) monitoring kit Use as directed to check blood sugars 1 time per day dx: e11.22   hydrALAZINE (APRESOLINE) 100 MG tablet Take 1 tablet (100 mg total) by mouth every 8 (eight) hours.   insulin aspart (NOVOLOG) 100 UNIT/ML FlexPen Inject into the skin with meals per sliding scale. 151-200= 2 units, 201-250= 4 units, 251-300= 6 units, 301-350=8 units, 351-400=10 units, >401=10 units and notify MD (Patient taking differently: Inject 2-10 Units into the skin in the morning and at bedtime.)   insulin aspart (NOVOLOG) 100 UNIT/ML injection Inject 0-9 Units into the skin 3 (three)  times daily with meals. (Patient not taking: Reported on 07/10/2021)   Lancets (ONETOUCH DELICA PLUS [id number]) MISC Use to check blood sugar once daily   metoprolol tartrate (LOPRESSOR) 25 MG tablet Take 0.5 tablets (12.5 mg total) by mouth 2 (two) times daily.   pantoprazole (PROTONIX) 40 MG tablet Take 1 tablet (40 mg total) by mouth 2 (two) times daily.   prochlorperazine (COMPAZINE) 5 MG tablet Take 1  tablet (5 mg total) by mouth every 6 (six) hours as needed for nausea or vomiting. (Patient not taking: Reported on 07/10/2021)   rosuvastatin (CRESTOR) 20 MG tablet Take 1 tablet (20 mg total) by mouth daily.   sucralfate (CARAFATE) 1 GM/10ML suspension Take 10 mLs (1 g total) by mouth 4 (four) times daily -  with meals and at bedtime for 28 days   Facility-Administered Encounter Medications as of 07/30/2021  Medication   cyanocobalamin ((VITAMIN B-12)) injection 1,000 mcg    Patient Active Problem List   Diagnosis Date Noted   Generalized weakness 97/98/9211   Acute metabolic encephalopathy    Malnutrition of moderate degree 05/24/2021   Sepsis ([place]) 05/23/2021   SIRS (systemic inflammatory response syndrome) ([place]) 05/22/2021   History of CVA (cerebrovascular accident) 05/22/2021   Gait instability 04/24/2021   Seasonal allergic rhinitis due to pollen 04/24/2021   Ascending aorta dilatation ([place]) 04/05/2021   HOCM (hypertrophic obstructive cardiomyopathy) ([place]) 04/04/2021   Physical deconditioning 04/04/2021   Syncope 04/03/2021   Fall at home, initial encounter 03/31/2021   Thyroid nodule 03/31/2021   CAP (community acquired pneumonia) 11/15/2020   Dehydration 11/15/2020   Erosive esophagitis    Hiatal hernia    Hyponatremia 10/10/2020   Acute kidney injury superimposed on chronic kidney disease ([place]) 10/10/2020   Aortic atherosclerosis ([place]) 10/09/2020   Acute renal failure superimposed on stage 3a chronic kidney disease ([place]) 10/09/2020   Hyperkalemia 10/09/2020   Hearing loss 10/09/2020   Prolonged QT interval 10/09/2020   Colitis, acute 08/11/2019   Colitis 07/11/2019   Loose stools 06/07/2019   Herpes zoster without complication 94/17/4081   Other long term (current) drug therapy 08/12/2018   Chronic kidney disease, stage 3a ([place]) 07/19/2018   Hypertensive nephropathy 07/19/2018   Community acquired pneumonia of right upper lobe of lung 11/07/2017   Hypertensive  emergency 07/01/2016   Thalamic hemorrhage ([place]) 07/01/2016   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[place])    Benign essential HTN    Type 2 diabetes mellitus with stage 3 chronic kidney disease, without long-term current use of insulin ([place])    Mixed hyperlipidemia    ICH (intracerebral hemorrhage) ([place]) - hypertensive R thalamic hemorrhage  06/27/2016   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   Retroperitoneal bleed 02/08/2016   Generalized abdominal pain    Gastroenteritis 02/04/2016   Diarrhea 02/04/2016   Hypokalemia 02/04/2016   Incarcerated paraesophageal hernia 09/02/2014   Renal mass, left 09/02/2014   Acute esophagitis 09/02/2014   Gastric outlet obstruction 09/02/2014   Hypertension    Vomiting 09/01/2014    Conditions to be addressed/monitored: HTN and DMII; Level of care concerns  Care Plan : Social Work Care Plan  Updates made by Daneen Schick since 07/30/2021 12:00 AM     Problem: Mobility and Independence      Goal: Mobility and Independence Optimized   Start Date: 04/24/2021  Expected End Date: 06/08/2021  This Visit's Progress: On track  Recent Progress: On track  Priority: High  Note:   Current Barriers:  Chronic disease management support  and education needs related to HTN and DM  ADL IADL limitations and Limited access to caregiver  Social Worker Clinical Goal(s):  patient will work with SW to identify and address any acute and/or chronic care coordination needs related to the self health management of HTN and DM  patient will work with SW to address concerns related to level of care needs  SW Interventions:  Inter-disciplinary care team collaboration (see longitudinal plan of care) Collaboration with Glendale Chard, MD regarding development and update of comprehensive plan of care as evidenced by provider attestation and co-signature Telephonic visit completed with the patients son [name] to follow up on goal progression Discussed he received a letter  from [facility] on Friday 12.24 indicating the patients has "full Medicaid" Reviewed this is the exact letter the patient received in the past but it was later determined the patient did not have full Medicaid Discussed plans for SW to contact [place] at [facility] to determine is a female Medicaid bed is still available and if she could determine if patients Medicaid has been appropriately switched to long term care Medicaid Unsuccessful outbound call placed to [employer] with [facility] - voice message left requesting a return call Below indicates updates on all SNF's within [facility] regarding bed availability: [facility] - no long term beds available [place]-  call returned  No beds available [facility]- no long term beds available [facility] - no beds available, over a year wait list [facility]- no beds available [place] - voice message left for [employer] requesting a return call. Received a voice message from Mrs. Barnet Pall stating she would email admission requirements to SW for review [place] at [facility] - no answer no option to leave voice message [place] return call- wait list over a year [place] - no beds available [facility] - left voice message requesting a return call [facility]- no beds available wait list of over 22 people [place] - spoke with Crystal, beds are available but unable to admit due to active COVID cases [facility] - no answer, no option to leave a voice message [place] - spoke with Shirlee Limerick who indicates they do have a bed and the patient must have long term care Medicaid approved prior to admission  Patient Goals/Self-Care Activities patient will: with the help of her son  -  Work with SW to identify long term placement opportunities  Follow Up Plan:  SW will follow up with [facility] over the next 48 hours       Follow Up Plan:  SW will follow up with  placement options over the next 48 hours      Daneen Schick, BSW, CDP Social Worker, Certified Dementia Practitioner [place] [facility] [place] [facility] [phone]

## 2021-07-31 ENCOUNTER — Ambulatory Visit: Payer: Medicare Other

## 2021-07-31 DIAGNOSIS — I77810 Thoracic aortic ectasia: Secondary | ICD-10-CM | POA: Diagnosis not present

## 2021-07-31 DIAGNOSIS — I1310 Hypertensive heart and chronic kidney disease without heart failure, with stage 1 through stage 4 chronic kidney disease, or unspecified chronic kidney disease: Secondary | ICD-10-CM

## 2021-07-31 DIAGNOSIS — N390 Urinary tract infection, site not specified: Secondary | ICD-10-CM | POA: Diagnosis not present

## 2021-07-31 DIAGNOSIS — G9341 Metabolic encephalopathy: Secondary | ICD-10-CM | POA: Diagnosis not present

## 2021-07-31 DIAGNOSIS — E440 Moderate protein-calorie malnutrition: Secondary | ICD-10-CM | POA: Diagnosis not present

## 2021-07-31 DIAGNOSIS — Z7982 Long term (current) use of aspirin: Secondary | ICD-10-CM | POA: Diagnosis not present

## 2021-07-31 DIAGNOSIS — I10 Essential (primary) hypertension: Secondary | ICD-10-CM | POA: Diagnosis not present

## 2021-07-31 DIAGNOSIS — Z8673 Personal history of transient ischemic attack (TIA), and cerebral infarction without residual deficits: Secondary | ICD-10-CM | POA: Diagnosis not present

## 2021-07-31 DIAGNOSIS — N1831 Chronic kidney disease, stage 3a: Secondary | ICD-10-CM

## 2021-07-31 DIAGNOSIS — J301 Allergic rhinitis due to pollen: Secondary | ICD-10-CM | POA: Diagnosis not present

## 2021-07-31 DIAGNOSIS — Z9181 History of falling: Secondary | ICD-10-CM | POA: Diagnosis not present

## 2021-07-31 DIAGNOSIS — E1140 Type 2 diabetes mellitus with diabetic neuropathy, unspecified: Secondary | ICD-10-CM | POA: Diagnosis not present

## 2021-07-31 DIAGNOSIS — E785 Hyperlipidemia, unspecified: Secondary | ICD-10-CM | POA: Diagnosis not present

## 2021-07-31 DIAGNOSIS — M1990 Unspecified osteoarthritis, unspecified site: Secondary | ICD-10-CM | POA: Diagnosis not present

## 2021-07-31 DIAGNOSIS — E1122 Type 2 diabetes mellitus with diabetic chronic kidney disease: Secondary | ICD-10-CM | POA: Diagnosis not present

## 2021-07-31 DIAGNOSIS — I700 Atherosclerosis of aorta: Secondary | ICD-10-CM | POA: Diagnosis not present

## 2021-07-31 DIAGNOSIS — N179 Acute kidney failure, unspecified: Secondary | ICD-10-CM | POA: Diagnosis not present

## 2021-07-31 DIAGNOSIS — E041 Nontoxic single thyroid nodule: Secondary | ICD-10-CM | POA: Diagnosis not present

## 2021-07-31 DIAGNOSIS — I421 Obstructive hypertrophic cardiomyopathy: Secondary | ICD-10-CM | POA: Diagnosis not present

## 2021-07-31 DIAGNOSIS — Z794 Long term (current) use of insulin: Secondary | ICD-10-CM | POA: Diagnosis not present

## 2021-07-31 DIAGNOSIS — H9190 Unspecified hearing loss, unspecified ear: Secondary | ICD-10-CM | POA: Diagnosis not present

## 2021-07-31 DIAGNOSIS — E872 Acidosis, unspecified: Secondary | ICD-10-CM | POA: Diagnosis not present

## 2021-07-31 DIAGNOSIS — E876 Hypokalemia: Secondary | ICD-10-CM | POA: Diagnosis not present

## 2021-07-31 NOTE — Chronic Care Management (AMB) (Signed)
Chronic Care Management     Social Work Note   07/24/2021 Name: Anna Hoffman           MRN: [id number]       DOB: 1935/07/12   [name] is a 86 y.o. year old female who is a primary care patient of Glendale Chard, MD. The CCM team was consulted to assist the patient with chronic disease management and/or care coordination needs related to:  HTN, DM II .    Collaboration with Anna Hoffman  for  follow up visit  in response to provider referral for social work chronic care management and care coordination services.    Consent to Services:  The patient was given information about Chronic Care Management services, agreed to services, and gave verbal consent prior to initiation of services.  Please see initial visit note for detailed documentation.    Patient agreed to services and consent obtained.    Assessment: Review of patient past medical history, allergies, medications, and health status, including review of relevant consultants reports was performed today as part of a comprehensive evaluation and provision of chronic care management and care coordination services.      SDOH (Social Determinants of Health) assessments and interventions performed:     Advanced Directives Status: Not addressed in this encounter.   CCM Care Plan        Allergies  Allergen Reactions   Amoxicillin Diarrhea      Has patient had a PCN reaction causing immediate rash, facial/tongue/throat swelling, SOB or lightheadedness with hypotension: no Has patient had a PCN reaction causing severe rash involving mucus membranes or skin necrosis: no Has patient had a PCN reaction that required hospitalization: no pharmacist consult Has patient had a PCN reaction occurring within the last 10 years: yes If all of the above answers are "NO", then may proceed with Cephalosporin use.     Cefepime        Possible CNS toxicity 05/2021   Ampicillin Rash      - Tolerates Rocephin and Ancef - Remote occurrence;  no symptoms of anaphylaxis or severe cutaneous reaction, and no additional medical attention required       Sulfa Antibiotics Rash          Outpatient Encounter Medications as of 07/31/2021  Medication Sig   acetaminophen (TYLENOL) 325 MG tablet Take 2 tablets (650 mg total) by mouth every 6 (six) hours as needed for mild pain (or Fever >/= 101). (Patient not taking: Reported on 07/15/2021)   amLODipine (NORVASC) 10 MG tablet Take 1 tablet (10 mg total) by mouth daily.   aspirin 81 MG EC tablet Take 1 tablet (81 mg total) by mouth daily. Swallow whole.   Vitamin D3 (VITAMIN D) 25 MCG tablet Take 1 tablet (1,000 Units total) by mouth daily.   diclofenac Sodium (VOLTAREN) 1 % GEL Apply 2 g topically every 12 (twelve) hours to left ankle and right shoulder pain   fexofenadine (ALLEGRA ALLERGY) 180 MG tablet Take 1 tablet (180 mg total) by mouth daily.   glucose blood test strip Use as directed to check blood sugars 1 time per day.   glucose monitoring kit (FREESTYLE) monitoring kit Use as directed to check blood sugars 1 time per day dx: e11.22   hydrALAZINE (APRESOLINE) 100 MG tablet Take 1 tablet (100 mg total) by mouth every 8 (eight) hours.   insulin aspart (NOVOLOG) 100 UNIT/ML FlexPen Inject into the skin with meals per sliding scale. 151-200= 2  units, 201-250= 4 units, 251-300= 6 units, 301-350=8 units, 351-400=10 units, >401=10 units and notify MD (Patient taking differently: Inject 2-10 Units into the skin in the morning and at bedtime.)   insulin aspart (NOVOLOG) 100 UNIT/ML injection Inject 0-9 Units into the skin 3 (three) times daily with meals. (Patient not taking: Reported on 07/10/2021)   Lancets (ONETOUCH DELICA PLUS JOINOM76H) MISC Use to check blood sugar once daily   metoprolol tartrate (LOPRESSOR) 25 MG tablet Take 0.5 tablets (12.5 mg total) by mouth 2 (two) times daily.   pantoprazole (PROTONIX) 40 MG tablet Take 1 tablet (40 mg total) by mouth 2 (two) times daily.    prochlorperazine (COMPAZINE) 5 MG tablet Take 1 tablet (5 mg total) by mouth every 6 (six) hours as needed for nausea or vomiting. (Patient not taking: Reported on 07/10/2021)   rosuvastatin (CRESTOR) 20 MG tablet Take 1 tablet (20 mg total) by mouth daily.   sucralfate (CARAFATE) 1 GM/10ML suspension Take 10 mLs (1 g total) by mouth 4 (four) times daily -  with meals and at bedtime for 28 days       Facility-Administered Encounter Medications as of 07/31/2021  Medication   cyanocobalamin ((VITAMIN B-12)) injection 1,000 mcg          Patient Active Problem List    Diagnosis Date Noted   Generalized weakness 20/94/7096   Acute metabolic encephalopathy     Malnutrition of moderate degree 05/24/2021   Sepsis ([place]) 05/23/2021   SIRS (systemic inflammatory response syndrome) ([place]) 05/22/2021   History of CVA (cerebrovascular accident) 05/22/2021   Gait instability 04/24/2021   Seasonal allergic rhinitis due to pollen 04/24/2021   Ascending aorta dilatation ([place]) 04/05/2021   HOCM (hypertrophic obstructive cardiomyopathy) ([place]) 04/04/2021   Physical deconditioning 04/04/2021   Syncope 04/03/2021   Fall at home, initial encounter 03/31/2021   Thyroid nodule 03/31/2021   CAP (community acquired pneumonia) 11/15/2020   Dehydration 11/15/2020   Erosive esophagitis     Hiatal hernia     Hyponatremia 10/10/2020   Acute kidney injury superimposed on chronic kidney disease ([place]) 10/10/2020   Aortic atherosclerosis ([place]) 10/09/2020   Acute renal failure superimposed on stage 3a chronic kidney disease ([place]) 10/09/2020   Hyperkalemia 10/09/2020   Hearing loss 10/09/2020   Prolonged QT interval 10/09/2020   Colitis, acute 08/11/2019   Colitis 07/11/2019   Loose stools 06/07/2019   Herpes zoster without complication 28/36/6294   Other long term (current) drug therapy 08/12/2018   Chronic kidney disease, stage 3a ([place]) 07/19/2018   Hypertensive nephropathy 07/19/2018   Community acquired  pneumonia of right upper lobe of lung 11/07/2017   Hypertensive emergency 07/01/2016   Thalamic hemorrhage ([place]) 07/01/2016   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[place])     Benign essential HTN     Type 2 diabetes mellitus with stage 3 chronic kidney disease, without long-term current use of insulin ([place])     Mixed hyperlipidemia     ICH (intracerebral hemorrhage) ([place]) - hypertensive R thalamic hemorrhage  06/27/2016   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   Retroperitoneal bleed 02/08/2016   Generalized abdominal pain     Gastroenteritis 02/04/2016   Diarrhea 02/04/2016   Hypokalemia 02/04/2016   Incarcerated paraesophageal hernia 09/02/2014   Renal mass, left 09/02/2014   Acute esophagitis 09/02/2014   Gastric outlet obstruction 09/02/2014   Hypertension     Vomiting 09/01/2014      Conditions to be addressed/monitored: HTN and DMII; Level of care concerns  Care Plan : Social Work Care Plan  Updates made by Milus Height since 07/24/2021 12:00 AM       Problem: Mobility and Independence         Goal: Mobility and Independence Optimized    Start Date: 04/24/2021  Expected End Date: 06/08/2021  Recent Progress: On track  Priority: High  Note:   Current Barriers:  Chronic disease management support and education needs related to HTN and DM  ADL IADL limitations and Limited access to caregiver   Social Worker Clinical Goal(s):  patient will work with SW to identify and address any acute and/or chronic care coordination needs related to the self health management of HTN and DM  patient will work with SW to address concerns related to level of care needs   SW Interventions:  Inter-disciplinary care team collaboration (see longitudinal plan of care) Collaboration with Glendale Chard, MD regarding development and update of comprehensive plan of care as evidenced by provider attestation and co-signature SW spoke with patients son -Mr. Williams today. On, Monday, Mr. Jimmye Norman  re-filed the medicaid paper work and dropped it off. He was told it would be a 45 day wait. He also made sure he labeled everything with Long term Care. He was quite frustrated with DSS and his experience.  SW attempted DSS today, to make them aware patients are awaiting the approval notice. SW left a VM with contact information.  Mr. Jimmye Norman stated the patient is doing well, no pain and was enjoying a "Big Mac" at the moment. He declined needing to speak with an Therapist, sports and spoke highly of you.     Patient Goals/Self-Care Activities patient will: with the help of her son Work with SW to identify long term placement opportunities  Follow Up Plan: SW Trinidad Curet will notify SW [place] of conversation with Mr. Jimmye Norman.  Below indicates updates on all SNF's within [facility] regarding bed availability: [facility] - no long term beds available [place]-  call returned  No beds available [facility]- no long term beds available [facility] - no beds available, over a year wait list [facility]- no beds available [employer]	[place] - voice message left for [employer] requesting a return call. Received a voice message from Mrs. Barnet Pall stating she would email admission requirements to SW for review [place] at [facility] - no answer no option to leave voice message [place] return call- wait list over a year [place] - no beds available [facility] - left voice message requesting a return call [facility]- no beds available wait list of over 57 people [place] - spoke with [place], beds are available but unable to admit due to active COVID cases [facility] - no answer, no option to leave a voice message [place] - spoke with Shirlee Limerick who indicates they do have a bed and the patient must have long term care Medicaid approved prior to admission   Patient Goals/Self-Care Activities patient will: with the help of her son  -  Work with SW to identify long term  placement opportunities               Milus Height, [place]  Social Worker [facility]  [phone]                     Progress Notes  No notes of this type exist for this encounter.    Patient Instructions  Social Worker Visit Information  Goals we discussed today:    Goals Addressed                       This Visit's Progress     Mobility and Independence Optimized            Timeframe:  Short-Term Goal Priority:  High Start Date:  9.21.22                                             Patient Goals/Self-Care Activities: patient will: with the help of her son  - Work with SW to identify long term placement opportunities               Follow Up Plan:  SW will follow up with DSS over the next 48 hours if a return call is not received regarding status of long term care Medicaid application     Daneen Schick, BSW, CDP Social Worker, Certified Dementia Practitioner [place] [facility] [place] [facility] [phone]

## 2021-07-31 NOTE — Chronic Care Management (AMB) (Signed)
Chronic Care Management    Social Work Note  07/31/2021 Name: Anna Hoffman MRN: [id number] DOB: 22-Apr-1935  Anna Hoffman is a 86 y.o. year old female who is a primary care patient of Glendale Chard, MD. The CCM team was consulted to assist the patient with chronic disease management and/or care coordination needs related to:  HTN, DM II .   Collaboration with [place]  for  case discussion  in response to provider referral for social work chronic care management and care coordination services.   Consent to Services:  The patient was given information about Chronic Care Management services, agreed to services, and gave verbal consent prior to initiation of services.  Please see initial visit note for detailed documentation.   Patient agreed to services and consent obtained.   Assessment: Review of patient past medical history, allergies, medications, and health status, including review of relevant consultants reports was performed today as part of a comprehensive evaluation and provision of chronic care management and care coordination services.     SDOH (Social Determinants of Health) assessments and interventions performed:    Advanced Directives Status: Not addressed in this encounter.  CCM Care Plan  Allergies  Allergen Reactions   Amoxicillin Diarrhea    Has patient had a PCN reaction causing immediate rash, facial/tongue/throat swelling, SOB or lightheadedness with hypotension: no Has patient had a PCN reaction causing severe rash involving mucus membranes or skin necrosis: no Has patient had a PCN reaction that required hospitalization: no pharmacist consult Has patient had a PCN reaction occurring within the last 10 years: yes If all of the above answers are "NO", then may proceed with Cephalosporin use.    Cefepime     Possible CNS toxicity 05/2021   Ampicillin Rash    - Tolerates Rocephin and Ancef - Remote occurrence; no symptoms of anaphylaxis or severe  cutaneous reaction, and no additional medical attention required     Sulfa Antibiotics Rash    Outpatient Encounter Medications as of 07/31/2021  Medication Sig   acetaminophen (TYLENOL) 325 MG tablet Take 2 tablets (650 mg total) by mouth every 6 (six) hours as needed for mild pain (or Fever >/= 101). (Patient not taking: Reported on 07/15/2021)   amLODipine (NORVASC) 10 MG tablet Take 1 tablet (10 mg total) by mouth daily.   aspirin 81 MG EC tablet Take 1 tablet (81 mg total) by mouth daily. Swallow whole.   Vitamin D3 (VITAMIN D) 25 MCG tablet Take 1 tablet (1,000 Units total) by mouth daily.   diclofenac Sodium (VOLTAREN) 1 % GEL Apply 2 g topically every 12 (twelve) hours to left ankle and right shoulder pain   fexofenadine (ALLEGRA ALLERGY) 180 MG tablet Take 1 tablet (180 mg total) by mouth daily.   glucose blood test strip Use as directed to check blood sugars 1 time per day.   glucose monitoring kit (FREESTYLE) monitoring kit Use as directed to check blood sugars 1 time per day dx: e11.22   hydrALAZINE (APRESOLINE) 100 MG tablet Take 1 tablet (100 mg total) by mouth every 8 (eight) hours.   insulin aspart (NOVOLOG) 100 UNIT/ML FlexPen Inject into the skin with meals per sliding scale. 151-200= 2 units, 201-250= 4 units, 251-300= 6 units, 301-350=8 units, 351-400=10 units, >401=10 units and notify MD (Patient taking differently: Inject 2-10 Units into the skin in the morning and at bedtime.)   insulin aspart (NOVOLOG) 100 UNIT/ML injection Inject 0-9 Units into the skin 3 (three)  times daily with meals. (Patient not taking: Reported on 07/10/2021)   Lancets (ONETOUCH DELICA PLUS [id number]) MISC Use to check blood sugar once daily   metoprolol tartrate (LOPRESSOR) 25 MG tablet Take 0.5 tablets (12.5 mg total) by mouth 2 (two) times daily.   pantoprazole (PROTONIX) 40 MG tablet Take 1 tablet (40 mg total) by mouth 2 (two) times daily.   prochlorperazine (COMPAZINE) 5 MG tablet Take 1  tablet (5 mg total) by mouth every 6 (six) hours as needed for nausea or vomiting. (Patient not taking: Reported on 07/10/2021)   rosuvastatin (CRESTOR) 20 MG tablet Take 1 tablet (20 mg total) by mouth daily.   sucralfate (CARAFATE) 1 GM/10ML suspension Take 10 mLs (1 g total) by mouth 4 (four) times daily -  with meals and at bedtime for 28 days   Facility-Administered Encounter Medications as of 07/31/2021  Medication   cyanocobalamin ((VITAMIN B-12)) injection 1,000 mcg    Patient Active Problem List   Diagnosis Date Noted   Generalized weakness 89/16/9450   Acute metabolic encephalopathy    Malnutrition of moderate degree 05/24/2021   Sepsis ([place]) 05/23/2021   SIRS (systemic inflammatory response syndrome) ([place]) 05/22/2021   History of CVA (cerebrovascular accident) 05/22/2021   Gait instability 04/24/2021   Seasonal allergic rhinitis due to pollen 04/24/2021   Ascending aorta dilatation ([place]) 04/05/2021   HOCM (hypertrophic obstructive cardiomyopathy) ([place]) 04/04/2021   Physical deconditioning 04/04/2021   Syncope 04/03/2021   Fall at home, initial encounter 03/31/2021   Thyroid nodule 03/31/2021   CAP (community acquired pneumonia) 11/15/2020   Dehydration 11/15/2020   Erosive esophagitis    Hiatal hernia    Hyponatremia 10/10/2020   Acute kidney injury superimposed on chronic kidney disease ([place]) 10/10/2020   Aortic atherosclerosis ([place]) 10/09/2020   Acute renal failure superimposed on stage 3a chronic kidney disease ([place]) 10/09/2020   Hyperkalemia 10/09/2020   Hearing loss 10/09/2020   Prolonged QT interval 10/09/2020   Colitis, acute 08/11/2019   Colitis 07/11/2019   Loose stools 06/07/2019   Herpes zoster without complication 38/88/2800   Other long term (current) drug therapy 08/12/2018   Chronic kidney disease, stage 3a ([place]) 07/19/2018   Hypertensive nephropathy 07/19/2018   Community acquired pneumonia of right upper lobe of lung 11/07/2017   Hypertensive  emergency 07/01/2016   Thalamic hemorrhage ([place]) 07/01/2016   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[place])    Benign essential HTN    Type 2 diabetes mellitus with stage 3 chronic kidney disease, without long-term current use of insulin ([place])    Mixed hyperlipidemia    ICH (intracerebral hemorrhage) ([place]) - hypertensive R thalamic hemorrhage  06/27/2016   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   Retroperitoneal bleed 02/08/2016   Generalized abdominal pain    Gastroenteritis 02/04/2016   Diarrhea 02/04/2016   Hypokalemia 02/04/2016   Incarcerated paraesophageal hernia 09/02/2014   Renal mass, left 09/02/2014   Acute esophagitis 09/02/2014   Gastric outlet obstruction 09/02/2014   Hypertension    Vomiting 09/01/2014    Conditions to be addressed/monitored: HTN and DMII; Level of care concerns  Care Plan : Social Work Care Plan  Updates made by Daneen Schick since 07/31/2021 12:00 AM     Problem: Mobility and Independence      Goal: Mobility and Independence Optimized   Start Date: 04/24/2021  Expected End Date: 06/08/2021  Recent Progress: On track  Priority: High  Note:   Current Barriers:  Chronic disease management support and education needs related to HTN  and DM  ADL IADL limitations and Limited access to caregiver  Social Worker Clinical Goal(s):  patient will work with SW to identify and address any acute and/or chronic care coordination needs related to the self health management of HTN and DM  patient will work with SW to address concerns related to level of care needs  SW Interventions:  Inter-disciplinary care team collaboration (see longitudinal plan of care) Collaboration with Glendale Chard, MD regarding development and update of comprehensive plan of care as evidenced by provider attestation and co-signature Communication received from [facility] with [facility] indicating she is currently out of the building due to testing positive for COVID but should  return to work on Friday and can check availability of a female Medicaid bed as well as review patients Medicaid status Discussed the community is currently experiencing a COVID outbreak and therefore not accepting patients at this time. However, they are re-testing the community today and pending the outcome of testing may be able to offer a bed in the coming days Successful outbound call placed to [facility] with [place] who reports the patients status in the computer does not indicate "long term care Medicaid" but she does see where a status change was requested and was forwarded to [place] call placed to Florian Buff [phone]) who is a long term care Medicaid caseworker with DSS - voice message left requesting a return call to review patients Medicaid application status Communication sent to patients son [name] to provide an update on above interventions Below indicates updates on all SNF's within [facility] regarding bed availability: [facility] - no long term beds available [place]-  call returned  No beds available [facility]- no long term beds available [facility] - no beds available, over a year wait list [facility]- no beds available [place] - voice message left for [employer] requesting a return call. Received a voice message from Mrs. Barnet Pall stating she would email admission requirements to SW for review [place] at [facility] - no answer no option to leave voice message [place] return call- wait list over a year [place] - no beds available [facility] - left voice message requesting a return call [facility]- no beds available wait list of over 75 people [place] - spoke with [place], beds are available but unable to admit due to active COVID cases [facility] - no answer, no option to leave a voice message [place] - spoke with Shirlee Limerick who indicates they do have a bed and the  patient must have long term care Medicaid approved prior to admission  Patient Goals/Self-Care Activities patient will: with the help of her son  -  Work with SW to identify long term placement opportunities  Follow Up Plan:  SW will follow up with Florian Buff over the next 48 hours if a return call is not received       Follow Up Plan:  SW will follow up with DSS over the next 48 hours if a return call is not received.      Daneen Schick, BSW, CDP Social Worker, Certified Dementia Practitioner [place] [facility] [phone]

## 2021-07-31 NOTE — Patient Instructions (Signed)
Social Worker Visit Information  Goals we discussed today:   Goals Addressed             This Visit's Progress    Mobility and Independence Optimized       Timeframe:  Short-Term Goal Priority:  High Start Date:  9.21.22                                             Patient Goals/Self-Care Activities: patient will: with the help of her son  - Work with SW to identify long term placement opportunities          Follow Up Plan:  SW will follow up with DSS over the next 48 hours if a return call is not received regarding status of long term care Medicaid application   Daneen Schick, BSW, CDP Social Worker, Certified Dementia Practitioner [place] [facility] [place] [facility] [phone]

## 2021-08-01 DIAGNOSIS — N179 Acute kidney failure, unspecified: Secondary | ICD-10-CM | POA: Diagnosis not present

## 2021-08-01 DIAGNOSIS — Z9181 History of falling: Secondary | ICD-10-CM | POA: Diagnosis not present

## 2021-08-01 DIAGNOSIS — I700 Atherosclerosis of aorta: Secondary | ICD-10-CM | POA: Diagnosis not present

## 2021-08-01 DIAGNOSIS — I77810 Thoracic aortic ectasia: Secondary | ICD-10-CM | POA: Diagnosis not present

## 2021-08-01 DIAGNOSIS — Z7982 Long term (current) use of aspirin: Secondary | ICD-10-CM | POA: Diagnosis not present

## 2021-08-01 DIAGNOSIS — N390 Urinary tract infection, site not specified: Secondary | ICD-10-CM | POA: Diagnosis not present

## 2021-08-01 DIAGNOSIS — G9341 Metabolic encephalopathy: Secondary | ICD-10-CM | POA: Diagnosis not present

## 2021-08-01 DIAGNOSIS — Z794 Long term (current) use of insulin: Secondary | ICD-10-CM | POA: Diagnosis not present

## 2021-08-01 DIAGNOSIS — E1122 Type 2 diabetes mellitus with diabetic chronic kidney disease: Secondary | ICD-10-CM | POA: Diagnosis not present

## 2021-08-01 DIAGNOSIS — M1990 Unspecified osteoarthritis, unspecified site: Secondary | ICD-10-CM | POA: Diagnosis not present

## 2021-08-01 DIAGNOSIS — E876 Hypokalemia: Secondary | ICD-10-CM | POA: Diagnosis not present

## 2021-08-01 DIAGNOSIS — H9190 Unspecified hearing loss, unspecified ear: Secondary | ICD-10-CM | POA: Diagnosis not present

## 2021-08-01 DIAGNOSIS — E440 Moderate protein-calorie malnutrition: Secondary | ICD-10-CM | POA: Diagnosis not present

## 2021-08-01 DIAGNOSIS — E872 Acidosis, unspecified: Secondary | ICD-10-CM | POA: Diagnosis not present

## 2021-08-01 DIAGNOSIS — E785 Hyperlipidemia, unspecified: Secondary | ICD-10-CM | POA: Diagnosis not present

## 2021-08-01 DIAGNOSIS — E041 Nontoxic single thyroid nodule: Secondary | ICD-10-CM | POA: Diagnosis not present

## 2021-08-01 DIAGNOSIS — I10 Essential (primary) hypertension: Secondary | ICD-10-CM | POA: Diagnosis not present

## 2021-08-01 DIAGNOSIS — J301 Allergic rhinitis due to pollen: Secondary | ICD-10-CM | POA: Diagnosis not present

## 2021-08-01 DIAGNOSIS — Z8673 Personal history of transient ischemic attack (TIA), and cerebral infarction without residual deficits: Secondary | ICD-10-CM | POA: Diagnosis not present

## 2021-08-01 DIAGNOSIS — I421 Obstructive hypertrophic cardiomyopathy: Secondary | ICD-10-CM | POA: Diagnosis not present

## 2021-08-01 DIAGNOSIS — E1140 Type 2 diabetes mellitus with diabetic neuropathy, unspecified: Secondary | ICD-10-CM | POA: Diagnosis not present

## 2021-08-01 DIAGNOSIS — N1831 Chronic kidney disease, stage 3a: Secondary | ICD-10-CM | POA: Diagnosis not present

## 2021-08-02 ENCOUNTER — Ambulatory Visit: Payer: Self-pay

## 2021-08-02 ENCOUNTER — Ambulatory Visit: Payer: Medicare Other

## 2021-08-02 ENCOUNTER — Telehealth: Payer: Medicare Other

## 2021-08-02 DIAGNOSIS — I1310 Hypertensive heart and chronic kidney disease without heart failure, with stage 1 through stage 4 chronic kidney disease, or unspecified chronic kidney disease: Secondary | ICD-10-CM

## 2021-08-02 DIAGNOSIS — N1831 Chronic kidney disease, stage 3a: Secondary | ICD-10-CM

## 2021-08-02 DIAGNOSIS — E1122 Type 2 diabetes mellitus with diabetic chronic kidney disease: Secondary | ICD-10-CM

## 2021-08-02 DIAGNOSIS — I5189 Other ill-defined heart diseases: Secondary | ICD-10-CM

## 2021-08-02 DIAGNOSIS — I10 Essential (primary) hypertension: Secondary | ICD-10-CM

## 2021-08-02 NOTE — Chronic Care Management (AMB) (Signed)
Chronic Care Management    Social Work Note  08/02/2021 Name: Anna Hoffman MRN: [id number] DOB: 1934-10-07  Anna Hoffman is a 86 y.o. year old female who is a primary care patient of Glendale Chard, MD. The CCM team was consulted to assist the patient with chronic disease management and/or care coordination needs related to:  HTN, DM II, Level of Care Concerns .   Engaged with patients son by phone  for follow up visit in response to provider referral for social work chronic care management and care coordination services.   Consent to Services:  The patient was given information about Chronic Care Management services, agreed to services, and gave verbal consent prior to initiation of services.  Please see initial visit note for detailed documentation.   Patient agreed to services and consent obtained.   Assessment: Review of patient past medical history, allergies, medications, and health status, including review of relevant consultants reports was performed today as part of a comprehensive evaluation and provision of chronic care management and care coordination services.     SDOH (Social Determinants of Health) assessments and interventions performed:    Advanced Directives Status: Not addressed in this encounter.  CCM Care Plan  Allergies  Allergen Reactions   Amoxicillin Diarrhea    Has patient had a PCN reaction causing immediate rash, facial/tongue/throat swelling, SOB or lightheadedness with hypotension: no Has patient had a PCN reaction causing severe rash involving mucus membranes or skin necrosis: no Has patient had a PCN reaction that required hospitalization: no pharmacist consult Has patient had a PCN reaction occurring within the last 10 years: yes If all of the above answers are "NO", then may proceed with Cephalosporin use.    Cefepime     Possible CNS toxicity 05/2021   Ampicillin Rash    - Tolerates Rocephin and Ancef - Remote occurrence; no symptoms of  anaphylaxis or severe cutaneous reaction, and no additional medical attention required     Sulfa Antibiotics Rash    Outpatient Encounter Medications as of 08/02/2021  Medication Sig   acetaminophen (TYLENOL) 325 MG tablet Take 2 tablets (650 mg total) by mouth every 6 (six) hours as needed for mild pain (or Fever >/= 101). (Patient not taking: Reported on 07/15/2021)   amLODipine (NORVASC) 10 MG tablet Take 1 tablet (10 mg total) by mouth daily.   aspirin 81 MG EC tablet Take 1 tablet (81 mg total) by mouth daily. Swallow whole.   Vitamin D3 (VITAMIN D) 25 MCG tablet Take 1 tablet (1,000 Units total) by mouth daily.   diclofenac Sodium (VOLTAREN) 1 % GEL Apply 2 g topically every 12 (twelve) hours to left ankle and right shoulder pain   fexofenadine (ALLEGRA ALLERGY) 180 MG tablet Take 1 tablet (180 mg total) by mouth daily.   glucose blood test strip Use as directed to check blood sugars 1 time per day.   glucose monitoring kit (FREESTYLE) monitoring kit Use as directed to check blood sugars 1 time per day dx: e11.22   hydrALAZINE (APRESOLINE) 100 MG tablet Take 1 tablet (100 mg total) by mouth every 8 (eight) hours.   insulin aspart (NOVOLOG) 100 UNIT/ML FlexPen Inject into the skin with meals per sliding scale. 151-200= 2 units, 201-250= 4 units, 251-300= 6 units, 301-350=8 units, 351-400=10 units, >401=10 units and notify MD (Patient taking differently: Inject 2-10 Units into the skin in the morning and at bedtime.)   insulin aspart (NOVOLOG) 100 UNIT/ML injection Inject 0-9 Units into the  skin 3 (three) times daily with meals. (Patient not taking: Reported on 07/10/2021)   Lancets (ONETOUCH DELICA PLUS [id number]) MISC Use to check blood sugar once daily   metoprolol tartrate (LOPRESSOR) 25 MG tablet Take 0.5 tablets (12.5 mg total) by mouth 2 (two) times daily.   pantoprazole (PROTONIX) 40 MG tablet Take 1 tablet (40 mg total) by mouth 2 (two) times daily.   prochlorperazine (COMPAZINE) 5  MG tablet Take 1 tablet (5 mg total) by mouth every 6 (six) hours as needed for nausea or vomiting. (Patient not taking: Reported on 07/10/2021)   rosuvastatin (CRESTOR) 20 MG tablet Take 1 tablet (20 mg total) by mouth daily.   sucralfate (CARAFATE) 1 GM/10ML suspension Take 10 mLs (1 g total) by mouth 4 (four) times daily -  with meals and at bedtime for 28 days   Facility-Administered Encounter Medications as of 08/02/2021  Medication   cyanocobalamin ((VITAMIN B-12)) injection 1,000 mcg    Patient Active Problem List   Diagnosis Date Noted   Generalized weakness 02/63/7858   Acute metabolic encephalopathy    Malnutrition of moderate degree 05/24/2021   Sepsis ([place]) 05/23/2021   SIRS (systemic inflammatory response syndrome) ([place]) 05/22/2021   History of CVA (cerebrovascular accident) 05/22/2021   Gait instability 04/24/2021   Seasonal allergic rhinitis due to pollen 04/24/2021   Ascending aorta dilatation ([place]) 04/05/2021   HOCM (hypertrophic obstructive cardiomyopathy) ([place]) 04/04/2021   Physical deconditioning 04/04/2021   Syncope 04/03/2021   Fall at home, initial encounter 03/31/2021   Thyroid nodule 03/31/2021   CAP (community acquired pneumonia) 11/15/2020   Dehydration 11/15/2020   Erosive esophagitis    Hiatal hernia    Hyponatremia 10/10/2020   Acute kidney injury superimposed on chronic kidney disease ([place]) 10/10/2020   Aortic atherosclerosis ([place]) 10/09/2020   Acute renal failure superimposed on stage 3a chronic kidney disease ([place]) 10/09/2020   Hyperkalemia 10/09/2020   Hearing loss 10/09/2020   Prolonged QT interval 10/09/2020   Colitis, acute 08/11/2019   Colitis 07/11/2019   Loose stools 06/07/2019   Herpes zoster without complication 85/09/7739   Other long term (current) drug therapy 08/12/2018   Chronic kidney disease, stage 3a ([place]) 07/19/2018   Hypertensive nephropathy 07/19/2018   Community acquired pneumonia of right upper lobe of lung 11/07/2017    Hypertensive emergency 07/01/2016   Thalamic hemorrhage ([place]) 07/01/2016   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[place])    Benign essential HTN    Type 2 diabetes mellitus with stage 3 chronic kidney disease, without long-term current use of insulin ([place])    Mixed hyperlipidemia    ICH (intracerebral hemorrhage) ([place]) - hypertensive R thalamic hemorrhage  06/27/2016   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   Retroperitoneal bleed 02/08/2016   Generalized abdominal pain    Gastroenteritis 02/04/2016   Diarrhea 02/04/2016   Hypokalemia 02/04/2016   Incarcerated paraesophageal hernia 09/02/2014   Renal mass, left 09/02/2014   Acute esophagitis 09/02/2014   Gastric outlet obstruction 09/02/2014   Hypertension    Vomiting 09/01/2014    Conditions to be addressed/monitored: HTN and DMII; Level of care concerns  Care Plan : Social Work Care Plan  Updates made by Daneen Schick since 08/02/2021 12:00 AM     Problem: Mobility and Independence      Goal: Mobility and Independence Optimized   Start Date: 04/24/2021  Expected End Date: 06/08/2021  Recent Progress: On track  Priority: High  Note:   Current Barriers:  Chronic disease management support and education needs  related to HTN and DM  ADL IADL limitations and Limited access to caregiver  Social Worker Clinical Goal(s):  patient will work with SW to identify and address any acute and/or chronic care coordination needs related to the self health management of HTN and DM  patient will work with SW to address concerns related to level of care needs  SW Interventions:  Inter-disciplinary care team collaboration (see longitudinal plan of care) Collaboration with Glendale Chard, MD regarding development and update of comprehensive plan of care as evidenced by provider attestation and co-signature Unsuccessful outbound call placed to long term care Medicaid DSS work Florian Buff - voice message left requesting a return  call Collaboration with [place] who reports she has spoken with the patient and patient expresses she does not want placement at this time Discussed with RN Care Manager and Dr. Baird Cancer the patient will be safer with placement due to decrease in strength and mobility Outbound call placed to the patients son [name] to discuss patients statement against placement - [name] requests SW proceed with placement plans as the patient is not safe in the home Discussed [name] is no longer working remotely and unable to visit the patient 4-5 times weeks as he lives in [place] is currently paying out of pocket to supplement care and provide meals to the patient but that is not a long term option and his brother plans to get married soon and will be unable to help [employer] that even with a health care power of attorney in place due to the patient being cognitive she can choose to leave a higher level of care at any time Determined [name] will plan to speak with his mother again about concerns of safety long term. [name] reports during his last visit he helped patient get dressed and she expressed to him she felt unsafe using the restroom alone at this time Discussed plans for SW to continue to follow up with DSS regarding LTC Medicaid application status in order to place patient if she is agreeable in the near future Collaboration with Dr. Baird Cancer and Barb Merino RN Care Manager to provide an update on interventions and plan  Patient Goals/Self-Care Activities patient will: with the help of her son  -  Review pros and cons for placement versus remaining in the home  Follow Up Plan:  SW will follow up with [facility] over the next 5 days       Follow Up Plan:  SW will follow up with [facility] over the next week.      Daneen Schick, BSW, CDP Social Worker, Certified Dementia Practitioner [facility] [phone]

## 2021-08-02 NOTE — Patient Instructions (Signed)
Social Worker Visit Information  Goals we discussed today:   Goals Addressed             This Visit's Progress    Mobility and Independence Optimized       Timeframe:  Short-Term Goal Priority:  High Start Date:  9.21.22                                             Patient Goals/Self-Care Activities patient will: with the help of her son  - Review pros and cons for placement versus remaining in the home          Materials Provided: Verbal education about placement process provided by phone  Patient verbalizes understanding of instructions provided today and agrees to view in [place].   Follow Up Plan:  SW will follow up with [facility] over the next 5 days  Daneen Schick, BSW, CDP Social Worker, Certified Dementia Practitioner [facility] [phone]

## 2021-08-02 NOTE — Patient Instructions (Signed)
Visit Information  Thank you for taking time to visit with me today. Please don't hesitate to contact me if I can be of assistance to you before our next scheduled telephone appointment.  Following are the goals we discussed today:  (Copy and paste patient goals from clinical care plan here)  Our next appointment is by telephone on 08/29/21 at 11:20 AM   Please call the care guide team at [phone] if you need to cancel or reschedule your appointment.   If you are experiencing a Mental Health or [place] or need someone to talk to, please call [phone]-TALK (toll free, 24 hour hotline)   Patient verbalizes understanding of instructions provided today and agrees to view in [place].   Barb Merino, RN, BSN, CCM Care Management Coordinator [facility]  Direct Phone: [phone]

## 2021-08-02 NOTE — Chronic Care Management (AMB) (Signed)
Chronic Care Management   CCM RN Visit Note  08/02/2021 Name: Anna Hoffman MRN: [id number] DOB: 07-02-1935  Subjective: Anna Hoffman is a 86 y.o. year old female who is a primary care patient of Glendale Chard, MD. The care management team was consulted for assistance with disease management and care coordination needs.    Engaged with patient by telephone for follow up visit in response to provider referral for case management and/or care coordination services.   Consent to Services:  The patient was given information about Chronic Care Management services, agreed to services, and gave verbal consent prior to initiation of services.  Please see initial visit note for detailed documentation.   Patient agreed to services and verbal consent obtained.   Assessment: Review of patient past medical history, allergies, medications, health status, including review of consultants reports, laboratory and other test data, was performed as part of comprehensive evaluation and provision of chronic care management services.   SDOH (Social Determinants of Health) assessments and interventions performed:    CCM Care Plan  Allergies  Allergen Reactions   Amoxicillin Diarrhea    Has patient had a PCN reaction causing immediate rash, facial/tongue/throat swelling, SOB or lightheadedness with hypotension: no Has patient had a PCN reaction causing severe rash involving mucus membranes or skin necrosis: no Has patient had a PCN reaction that required hospitalization: no pharmacist consult Has patient had a PCN reaction occurring within the last 10 years: yes If all of the above answers are "NO", then may proceed with Cephalosporin use.    Cefepime     Possible CNS toxicity 05/2021   Ampicillin Rash    - Tolerates Rocephin and Ancef - Remote occurrence; no symptoms of anaphylaxis or severe cutaneous reaction, and no additional medical attention required     Sulfa Antibiotics Rash     Outpatient Encounter Medications as of 08/02/2021  Medication Sig   acetaminophen (TYLENOL) 325 MG tablet Take 2 tablets (650 mg total) by mouth every 6 (six) hours as needed for mild pain (or Fever >/= 101). (Patient not taking: Reported on 07/15/2021)   amLODipine (NORVASC) 10 MG tablet Take 1 tablet (10 mg total) by mouth daily.   aspirin 81 MG EC tablet Take 1 tablet (81 mg total) by mouth daily. Swallow whole.   Vitamin D3 (VITAMIN D) 25 MCG tablet Take 1 tablet (1,000 Units total) by mouth daily.   diclofenac Sodium (VOLTAREN) 1 % GEL Apply 2 g topically every 12 (twelve) hours to left ankle and right shoulder pain   fexofenadine (ALLEGRA ALLERGY) 180 MG tablet Take 1 tablet (180 mg total) by mouth daily.   glucose blood test strip Use as directed to check blood sugars 1 time per day.   glucose monitoring kit (FREESTYLE) monitoring kit Use as directed to check blood sugars 1 time per day dx: e11.22   hydrALAZINE (APRESOLINE) 100 MG tablet Take 1 tablet (100 mg total) by mouth every 8 (eight) hours.   insulin aspart (NOVOLOG) 100 UNIT/ML FlexPen Inject into the skin with meals per sliding scale. 151-200= 2 units, 201-250= 4 units, 251-300= 6 units, 301-350=8 units, 351-400=10 units, >401=10 units and notify MD (Patient taking differently: Inject 2-10 Units into the skin in the morning and at bedtime.)   insulin aspart (NOVOLOG) 100 UNIT/ML injection Inject 0-9 Units into the skin 3 (three) times daily with meals. (Patient not taking: Reported on 07/10/2021)   Lancets (ONETOUCH DELICA PLUS [id number]) MISC Use to check blood sugar once  metoprolol tartrate (LOPRESSOR) 25 MG tablet Take 0.5 tablets (12.5 mg total) by mouth 2 (two) times daily.   pantoprazole (PROTONIX) 40 MG tablet Take 1 tablet (40 mg total) by mouth 2 (two) times daily.   prochlorperazine (COMPAZINE) 5 MG tablet Take 1 tablet (5 mg total) by mouth every 6 (six) hours as needed for nausea or vomiting. (Patient not  taking: Reported on 07/10/2021)   rosuvastatin (CRESTOR) 20 MG tablet Take 1 tablet (20 mg total) by mouth daily.   sucralfate (CARAFATE) 1 GM/10ML suspension Take 10 mLs (1 g total) by mouth 4 (four) times daily -  with meals and at bedtime for 28 days   Facility-Administered Encounter Medications as of 08/02/2021  Medication   cyanocobalamin ((VITAMIN B-12)) injection 1,000 mcg    Patient Active Problem List   Diagnosis Date Noted   Generalized weakness 59/93/5701   Acute metabolic encephalopathy    Malnutrition of moderate degree 05/24/2021   Sepsis ([place]) 05/23/2021   SIRS (systemic inflammatory response syndrome) ([place]) 05/22/2021   History of CVA (cerebrovascular accident) 05/22/2021   Gait instability 04/24/2021   Seasonal allergic rhinitis due to pollen 04/24/2021   Ascending aorta dilatation ([place]) 04/05/2021   HOCM (hypertrophic obstructive cardiomyopathy) ([place]) 04/04/2021   Physical deconditioning 04/04/2021   Syncope 04/03/2021   Fall at home, initial encounter 03/31/2021   Thyroid nodule 03/31/2021   CAP (community acquired pneumonia) 11/15/2020   Dehydration 11/15/2020   Erosive esophagitis    Hiatal hernia    Hyponatremia 10/10/2020   Acute kidney injury superimposed on chronic kidney disease ([place]) 10/10/2020   Aortic atherosclerosis ([place]) 10/09/2020   Acute renal failure superimposed on stage 3a chronic kidney disease ([place]) 10/09/2020   Hyperkalemia 10/09/2020   Hearing loss 10/09/2020   Prolonged QT interval 10/09/2020   Colitis, acute 08/11/2019   Colitis 07/11/2019   Loose stools 06/07/2019   Herpes zoster without complication 77/93/9030   Other long term (current) drug therapy 08/12/2018   Chronic kidney disease, stage 3a ([place]) 07/19/2018   Hypertensive nephropathy 07/19/2018   Community acquired pneumonia of right upper lobe of lung 11/07/2017   Hypertensive emergency 07/01/2016   Thalamic hemorrhage ([place]) 07/01/2016   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[place])    Benign essential HTN    Type 2 diabetes mellitus with stage 3 chronic kidney disease, without long-term current use of insulin ([place])    Mixed hyperlipidemia    ICH (intracerebral hemorrhage) ([place]) - hypertensive R thalamic hemorrhage  06/27/2016   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   Retroperitoneal bleed 02/08/2016   Generalized abdominal pain    Gastroenteritis 02/04/2016   Diarrhea 02/04/2016   Hypokalemia 02/04/2016   Incarcerated paraesophageal hernia 09/02/2014   Renal mass, left 09/02/2014   Acute esophagitis 09/02/2014   Gastric outlet obstruction 09/02/2014   Hypertension    Vomiting 09/01/2014    Conditions to be addressed/monitored: Type 2 DM with Stage III CKD, HTN, Hypertensive Nephropathy, Diastolic Dysfunction  Care Plan : RN Care Manager Plan of Care  Updates made by Lynne Logan, RN since 08/02/2021 12:00 AM     Problem: No Plan of Care established for management of chronic disease states (Type 2 DM with Stage III CKD, HTN, Hypertensive Nephropathy, Diastolic Dysfunction)   Priority: High     Long-Range Goal: Establishment of plan of care for management of chronic disease states (Type 2 DM with Stage III CKD, HTN, Hypertensive Nephropathy, Diastolic Dysfunction)   Start Date: 08/02/2021  Expected End Date: 08/02/2022  End Date: 08/02/2022  This Visit's Progress: On track  Priority: High  Note:   Current Barriers:  Knowledge Deficits related to plan of care for management of Type 2 DM with Stage III CKD, HTN, Hypertensive Nephropathy, Diastolic Dysfunction  Chronic Disease Management support and education needs related to Type 2 DM with Stage III CKD, HTN, Hypertensive Nephropathy, Diastolic Dysfunction   RNCM Clinical Goal(s):  Patient will verbalize basic understanding of  Type 2 DM with Stage III CKD, HTN, Hypertensive Nephropathy, Diastolic Dysfunction disease process and self health management plan as evidenced by patient will report having no disease  exacerbations related to her chronic disease states  take all medications exactly as prescribed and will call provider for medication related questions as evidenced by patient will report having no misses doses of her prescribed medications  demonstrate Improved health management independence as evidenced by patient will report 100% adherence to her prescribed treatment plan  continue to work with RN Care Manager to address care management and care coordination needs related to  Type 2 DM with Stage III CKD, HTN, Hypertensive Nephropathy, Diastolic Dysfunction as evidenced by adherence to CM Team Scheduled appointments demonstrate ongoing self health care management ability   as evidenced by    through collaboration with RN Care manager, provider, and care team.   Interventions: 1:1 collaboration with primary care provider regarding development and update of comprehensive plan of care as evidenced by provider attestation and co-signature Inter-disciplinary care team collaboration (see longitudinal plan of care) Evaluation of current treatment plan related to  self management and patient's adherence to plan as established by provider   Heart Failure Interventions:  (Status:  Condition stable.  Not addressed this visit.) Long Term Goal Basic overview and discussion of pathophysiology of Heart Failure reviewed Provided education on low sodium diet Assessed need for readable accurate scales in home Advised patient to weigh each morning after emptying bladder Discussed importance of daily weight and advised patient to weigh and record daily Discussed the importance of keeping all appointments with provider Assessed social determinant of health barriers  Discussed plans with patient for ongoing care management follow up and provided patient with direct contact information for care management team  Diabetes Interventions:  (Status:  Condition stable.  Not addressed this visit.) Long Term  Goal Assessed patient's understanding of A1c goal:  <5.7 Provided education to patient about basic DM disease process Reviewed medications with patient and discussed importance of medication adherence Counseled on importance of regular laboratory monitoring as prescribed Review of patient status, including review of consultants reports, relevant laboratory and other test results, and medications completed Discussed plans with patient for ongoing care management follow up and provided patient with direct contact information for care management team Lab Results  Component Value Date   HGBA1C 5.8 (H) 07/12/2021   Hypertension Interventions:  (Status:  Condition stable.  Not addressed this visit.) Long Term Goal Last practice recorded BP readings:  BP Readings from Last 3 Encounters:  07/15/21 (!) 147/80  07/10/21 136/78  06/06/21 (!) 148/78  Most recent eGFR/CrCl:  Lab Results  Component Value Date   EGFR 28 (L) 07/10/2021    No components found for: CRCL  Evaluation of current treatment plan related to hypertension self management and patient's adherence to plan as established by provider Provided education on prescribed diet low Sodium  Discussed complications of poorly controlled blood pressure such as heart disease, stroke, circulatory complications, vision complications, kidney impairment, sexual dysfunction Discussed plans with patient  follow up and provided patient with direct contact information for care management team  Frailty Interventions:  (Status:  New goal.)  Short Term Goal Evaluation of current treatment plan related to  Type 2 DM with Stage III CKD, HTN, Hypertensive Nephropathy, Diastolic Dysfunction  ,  self-management and patient's adherence to plan as established by provider Assessed for home safety concerns and or DME; Determined patient feels she is improving since being home from her recent IP event Discussed patient is receiving in  home PT/OT twice weekly, she is working on strengthening and balance, she is following a HEP as tolerated Discussed patient's son and family are providing meals to her, she reports having a good appetite, reports drinking 3-4 bottles of water daily  Determined patient would like to stay in her home long term and expressed she does not wish to be placed in a long term care facility; Encouraged patient to discuss her concerns with son [name] Encouraged patient to notify her family and PCP of new symptoms or other health related concerns Encouraged patient to take her time with ambulation and or when changing positions, instructed to report any/all falls promptly  Collaborated with embedded BSW Daneen Schick and PCP Dr. Glendale Chard regarding patient's concerns for placement  Discussed plans with patient for ongoing care management follow up and provided patient with direct contact information for care management team   Patient Goals/Self-Care Activities: Take all medications as prescribed Attend all scheduled provider appointments Call pharmacy for medication refills 3-7 days in advance of running out of medications Call provider office for new concerns or questions  Work with the social worker to address care coordination needs and will continue to work with the clinical team to address health care and disease management related needs track weight in diary use salt in moderation drink 6 to 8 glasses of water each day manage portion size take medications for blood pressure exactly as prescribed report new symptoms to your doctor  Follow Up Plan:  Telephone follow up appointment with care management team member scheduled for:  08/29/21        Plan:Telephone follow up appointment with care management team member scheduled for:  08/29/21  Barb Merino, RN, BSN, CCM Care Management Coordinator [facility]  Direct Phone: [phone]

## 2021-08-03 DIAGNOSIS — I10 Essential (primary) hypertension: Secondary | ICD-10-CM | POA: Diagnosis not present

## 2021-08-03 DIAGNOSIS — N1831 Chronic kidney disease, stage 3a: Secondary | ICD-10-CM | POA: Diagnosis not present

## 2021-08-03 DIAGNOSIS — F3289 Other specified depressive episodes: Secondary | ICD-10-CM

## 2021-08-03 DIAGNOSIS — E1122 Type 2 diabetes mellitus with diabetic chronic kidney disease: Secondary | ICD-10-CM | POA: Diagnosis not present

## 2021-08-03 DIAGNOSIS — I1310 Hypertensive heart and chronic kidney disease without heart failure, with stage 1 through stage 4 chronic kidney disease, or unspecified chronic kidney disease: Secondary | ICD-10-CM

## 2021-08-04 DIAGNOSIS — M6281 Muscle weakness (generalized): Secondary | ICD-10-CM | POA: Diagnosis not present

## 2021-08-04 DIAGNOSIS — R2689 Other abnormalities of gait and mobility: Secondary | ICD-10-CM | POA: Diagnosis not present

## 2021-08-04 DIAGNOSIS — I639 Cerebral infarction, unspecified: Secondary | ICD-10-CM | POA: Diagnosis not present

## 2021-08-04 DIAGNOSIS — I421 Obstructive hypertrophic cardiomyopathy: Secondary | ICD-10-CM | POA: Diagnosis not present

## 2021-08-06 ENCOUNTER — Ambulatory Visit (INDEPENDENT_AMBULATORY_CARE_PROVIDER_SITE_OTHER): Payer: Medicare Other

## 2021-08-06 ENCOUNTER — Telehealth: Payer: Self-pay

## 2021-08-06 DIAGNOSIS — G9341 Metabolic encephalopathy: Secondary | ICD-10-CM | POA: Diagnosis not present

## 2021-08-06 DIAGNOSIS — Z8673 Personal history of transient ischemic attack (TIA), and cerebral infarction without residual deficits: Secondary | ICD-10-CM | POA: Diagnosis not present

## 2021-08-06 DIAGNOSIS — I77810 Thoracic aortic ectasia: Secondary | ICD-10-CM | POA: Diagnosis not present

## 2021-08-06 DIAGNOSIS — N1831 Chronic kidney disease, stage 3a: Secondary | ICD-10-CM

## 2021-08-06 DIAGNOSIS — N390 Urinary tract infection, site not specified: Secondary | ICD-10-CM | POA: Diagnosis not present

## 2021-08-06 DIAGNOSIS — E1122 Type 2 diabetes mellitus with diabetic chronic kidney disease: Secondary | ICD-10-CM | POA: Diagnosis not present

## 2021-08-06 DIAGNOSIS — Z7982 Long term (current) use of aspirin: Secondary | ICD-10-CM | POA: Diagnosis not present

## 2021-08-06 DIAGNOSIS — Z794 Long term (current) use of insulin: Secondary | ICD-10-CM | POA: Diagnosis not present

## 2021-08-06 DIAGNOSIS — I700 Atherosclerosis of aorta: Secondary | ICD-10-CM | POA: Diagnosis not present

## 2021-08-06 DIAGNOSIS — J301 Allergic rhinitis due to pollen: Secondary | ICD-10-CM | POA: Diagnosis not present

## 2021-08-06 DIAGNOSIS — E440 Moderate protein-calorie malnutrition: Secondary | ICD-10-CM | POA: Diagnosis not present

## 2021-08-06 DIAGNOSIS — Z9181 History of falling: Secondary | ICD-10-CM | POA: Diagnosis not present

## 2021-08-06 DIAGNOSIS — E872 Acidosis, unspecified: Secondary | ICD-10-CM | POA: Diagnosis not present

## 2021-08-06 DIAGNOSIS — I10 Essential (primary) hypertension: Secondary | ICD-10-CM | POA: Diagnosis not present

## 2021-08-06 DIAGNOSIS — I421 Obstructive hypertrophic cardiomyopathy: Secondary | ICD-10-CM | POA: Diagnosis not present

## 2021-08-06 DIAGNOSIS — E876 Hypokalemia: Secondary | ICD-10-CM | POA: Diagnosis not present

## 2021-08-06 DIAGNOSIS — E1140 Type 2 diabetes mellitus with diabetic neuropathy, unspecified: Secondary | ICD-10-CM | POA: Diagnosis not present

## 2021-08-06 DIAGNOSIS — M1990 Unspecified osteoarthritis, unspecified site: Secondary | ICD-10-CM | POA: Diagnosis not present

## 2021-08-06 DIAGNOSIS — E785 Hyperlipidemia, unspecified: Secondary | ICD-10-CM | POA: Diagnosis not present

## 2021-08-06 DIAGNOSIS — N179 Acute kidney failure, unspecified: Secondary | ICD-10-CM | POA: Diagnosis not present

## 2021-08-06 DIAGNOSIS — H9190 Unspecified hearing loss, unspecified ear: Secondary | ICD-10-CM | POA: Diagnosis not present

## 2021-08-06 DIAGNOSIS — E041 Nontoxic single thyroid nodule: Secondary | ICD-10-CM | POA: Diagnosis not present

## 2021-08-06 NOTE — Chronic Care Management (AMB) (Signed)
Chronic Care Management    Social Work Note  08/06/2021 Name: Anna Hoffman MRN: [id number] DOB: 1934/11/18  Keonda Dow is a 87 y.o. year old female who is a primary care patient of Glendale Chard, MD. The CCM team was consulted to assist the patient with chronic disease management and/or care coordination needs related to:  HTN, DM II, Level of Care .   Collaboration with community resources  for  case collaboration  in response to provider referral for social work chronic care management and care coordination services.   Consent to Services:  The patient was given information about Chronic Care Management services, agreed to services, and gave verbal consent prior to initiation of services.  Please see initial visit note for detailed documentation.   Patient agreed to services and consent obtained.   Assessment: Review of patient past medical history, allergies, medications, and health status, including review of relevant consultants reports was performed today as part of a comprehensive evaluation and provision of chronic care management and care coordination services.     SDOH (Social Determinants of Health) assessments and interventions performed:    Advanced Directives Status: Not addressed in this encounter.  CCM Care Plan  Allergies  Allergen Reactions   Amoxicillin Diarrhea    Has patient had a PCN reaction causing immediate rash, facial/tongue/throat swelling, SOB or lightheadedness with hypotension: no Has patient had a PCN reaction causing severe rash involving mucus membranes or skin necrosis: no Has patient had a PCN reaction that required hospitalization: no pharmacist consult Has patient had a PCN reaction occurring within the last 10 years: yes If all of the above answers are "NO", then may proceed with Cephalosporin use.    Cefepime     Possible CNS toxicity 05/2021   Ampicillin Rash    - Tolerates Rocephin and Ancef - Remote occurrence; no symptoms of  anaphylaxis or severe cutaneous reaction, and no additional medical attention required     Sulfa Antibiotics Rash    Outpatient Encounter Medications as of 08/06/2021  Medication Sig   acetaminophen (TYLENOL) 325 MG tablet Take 2 tablets (650 mg total) by mouth every 6 (six) hours as needed for mild pain (or Fever >/= 101). (Patient not taking: Reported on 07/15/2021)   amLODipine (NORVASC) 10 MG tablet Take 1 tablet (10 mg total) by mouth daily.   aspirin 81 MG EC tablet Take 1 tablet (81 mg total) by mouth daily. Swallow whole.   Vitamin D3 (VITAMIN D) 25 MCG tablet Take 1 tablet (1,000 Units total) by mouth daily.   diclofenac Sodium (VOLTAREN) 1 % GEL Apply 2 g topically every 12 (twelve) hours to left ankle and right shoulder pain   fexofenadine (ALLEGRA ALLERGY) 180 MG tablet Take 1 tablet (180 mg total) by mouth daily.   glucose blood test strip Use as directed to check blood sugars 1 time per day.   glucose monitoring kit (FREESTYLE) monitoring kit Use as directed to check blood sugars 1 time per day dx: e11.22   hydrALAZINE (APRESOLINE) 100 MG tablet Take 1 tablet (100 mg total) by mouth every 8 (eight) hours.   insulin aspart (NOVOLOG) 100 UNIT/ML FlexPen Inject into the skin with meals per sliding scale. 151-200= 2 units, 201-250= 4 units, 251-300= 6 units, 301-350=8 units, 351-400=10 units, >401=10 units and notify MD (Patient taking differently: Inject 2-10 Units into the skin in the morning and at bedtime.)   insulin aspart (NOVOLOG) 100 UNIT/ML injection Inject 0-9 Units into the skin 3 (  three) times daily with meals. (Patient not taking: Reported on 07/10/2021)   Lancets (ONETOUCH DELICA PLUS [id number]) MISC Use to check blood sugar once daily   metoprolol tartrate (LOPRESSOR) 25 MG tablet Take 0.5 tablets (12.5 mg total) by mouth 2 (two) times daily.   pantoprazole (PROTONIX) 40 MG tablet Take 1 tablet (40 mg total) by mouth 2 (two) times daily.   prochlorperazine (COMPAZINE) 5 MG  tablet Take 1 tablet (5 mg total) by mouth every 6 (six) hours as needed for nausea or vomiting. (Patient not taking: Reported on 07/10/2021)   rosuvastatin (CRESTOR) 20 MG tablet Take 1 tablet (20 mg total) by mouth daily.   sucralfate (CARAFATE) 1 GM/10ML suspension Take 10 mLs (1 g total) by mouth 4 (four) times daily -  with meals and at bedtime for 28 days   Facility-Administered Encounter Medications as of 08/06/2021  Medication   cyanocobalamin ((VITAMIN B-12)) injection 1,000 mcg    Patient Active Problem List   Diagnosis Date Noted   Generalized weakness 32/67/1245   Acute metabolic encephalopathy    Malnutrition of moderate degree 05/24/2021   Sepsis ([place]) 05/23/2021   SIRS (systemic inflammatory response syndrome) ([place]) 05/22/2021   History of CVA (cerebrovascular accident) 05/22/2021   Gait instability 04/24/2021   Seasonal allergic rhinitis due to pollen 04/24/2021   Ascending aorta dilatation ([place]) 04/05/2021   HOCM (hypertrophic obstructive cardiomyopathy) ([place]) 04/04/2021   Physical deconditioning 04/04/2021   Syncope 04/03/2021   Fall at home, initial encounter 03/31/2021   Thyroid nodule 03/31/2021   CAP (community acquired pneumonia) 11/15/2020   Dehydration 11/15/2020   Erosive esophagitis    Hiatal hernia    Hyponatremia 10/10/2020   Acute kidney injury superimposed on chronic kidney disease ([place]) 10/10/2020   Aortic atherosclerosis ([place]) 10/09/2020   Acute renal failure superimposed on stage 3a chronic kidney disease ([place]) 10/09/2020   Hyperkalemia 10/09/2020   Hearing loss 10/09/2020   Prolonged QT interval 10/09/2020   Colitis, acute 08/11/2019   Colitis 07/11/2019   Loose stools 06/07/2019   Herpes zoster without complication 80/99/8338   Other long term (current) drug therapy 08/12/2018   Chronic kidney disease, stage 3a ([place]) 07/19/2018   Hypertensive nephropathy 07/19/2018   Community acquired pneumonia of right upper lobe of lung 11/07/2017    Hypertensive emergency 07/01/2016   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/01/2016   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[place])    Benign essential HTN    Type 2 diabetes mellitus with stage 3 chronic kidney disease, without long-term current use of insulin ([place])    Mixed hyperlipidemia    ICH (intracerebral hemorrhage) ([place]) - hypertensive R thalamic hemorrhage  06/27/2016   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   Retroperitoneal bleed 02/08/2016   Generalized abdominal pain    Gastroenteritis 02/04/2016   Diarrhea 02/04/2016   Hypokalemia 02/04/2016   Incarcerated paraesophageal hernia 09/02/2014   Renal mass, left 09/02/2014   Acute esophagitis 09/02/2014   Gastric outlet obstruction 09/02/2014   Hypertension    Vomiting 09/01/2014    Conditions to be addressed/monitored: HTN and DMII; Level of care concerns  Care Plan : Social Work Care Plan  Updates made by Daneen Schick since 08/06/2021 12:00 AM     Problem: Mobility and Independence      Goal: Mobility and Independence Optimized   Start Date: 04/24/2021  Expected End Date: 06/08/2021  Recent Progress: On track  Priority: High  Note:   Current Barriers:  Chronic disease management support and education needs related to  HTN and DM  ADL IADL limitations and Limited access to caregiver  Social Worker Clinical Goal(s):  patient will work with SW to identify and address any acute and/or chronic care coordination needs related to the self health management of HTN and DM  patient will work with SW to address concerns related to level of care needs  SW Interventions:  Inter-disciplinary care team collaboration (see longitudinal plan of care) Collaboration with Glendale Chard, MD regarding development and update of comprehensive plan of care as evidenced by provider attestation and co-signature Collaboration with Rolin Barry from [facility] to discuss community is able to admit patients at this time as COVID outbreak has  resolved Discussed patient still currently shows as Medicaid MQB instead of LTC Medicaid - Mrs. Fitzgerald informed SW if patients family is willing to provide full SSN the business office manager will contact DSS to follow up on application status Discussed that although LTC Medicaid and placement normally happens simultaneously, unless the patient is awarded [employer] prior to movie in, she will still need to be private pay. Mrs. Ola Spurr indicates patients normally must be placed prior to DSS approving LTC Medicaid so patient will most likely need to be placed in a different community and then transfer to [facility] if desired Reviewed SW has been unable to locate a community with an available bed who will accept the patient prior to LTC Medicaid approval Discussed SW will outreach the patients son to determine if he is willing to provide patients SSN to Mrs. Fitzgerald Unsuccessful outbound call placed to [place] Medicaid SW requesting a return call Unsuccessful outbound call placed to patients son [name], unable to leave voice message Secure e-mail correspondence sent to patients son [name] advising of above information and to inquire if he would like to provide patients SSN Inbound call received from patients son, reviewed above information and obtained patient SSN Discussed plans for SW to provide SSN to [employer], also discussed barriers to placement due to LTC Medicaid approval process and hopes patients LTC Medicaid will be approved prior to placement   Patient Goals/Self-Care Activities patient will: with the help of her son  -  Review pros and cons for placement versus remaining in the home  Follow Up Plan:  SW will follow up with the patient over the next week       Follow Up Plan: SW will follow up with patient by phone over the next week.      Daneen Schick, BSW, CDP Social Worker, Certified Dementia Practitioner [place] [facility] [place]  [facility] [phone]

## 2021-08-06 NOTE — Patient Instructions (Signed)
Social Worker Visit Information  Goals we discussed today:   Goals Addressed             This Visit's Progress    Mobility and Independence Optimized       Timeframe:  Short-Term Goal Priority:  High Start Date:  9.21.22                                             Patient Goals/Self-Care Activities: patient will: with the help of her son  - Review pros and cons for placement versus remaining in the home          Materials Provided: Verbal education about placement process provided by phone  Patient verbalizes understanding of instructions provided today and agrees to view in [place].   Follow Up Plan: SW will follow up with patient by phone over the next week   Daneen Schick, BSW, CDP Social Worker, Certified Dementia Practitioner [place] [facility] [place] [facility] [phone]

## 2021-08-06 NOTE — Telephone Encounter (Signed)
°  Care Management   Follow Up Note   08/06/2021 Name: Lahna Nath MRN: [id number] DOB: [birth year]   Referred by: Glendale Chard, MD Reason for referral : Chronic Care Management   Unsuccessful outbound call placed to [place] with [facility] to assess if the community is accepting new patients at this time. Voice message left requesting a return call.  Follow Up Plan: The care management team will reach out to the patient again over the next 15 days.   Daneen Schick, BSW, CDP Social Worker, Certified Dementia Practitioner [place] [facility] [place] [facility] [phone]

## 2021-08-08 ENCOUNTER — Other Ambulatory Visit (HOSPITAL_COMMUNITY): Payer: Self-pay

## 2021-08-08 DIAGNOSIS — H9190 Unspecified hearing loss, unspecified ear: Secondary | ICD-10-CM | POA: Diagnosis not present

## 2021-08-08 DIAGNOSIS — E1122 Type 2 diabetes mellitus with diabetic chronic kidney disease: Secondary | ICD-10-CM | POA: Diagnosis not present

## 2021-08-08 DIAGNOSIS — I421 Obstructive hypertrophic cardiomyopathy: Secondary | ICD-10-CM | POA: Diagnosis not present

## 2021-08-08 DIAGNOSIS — G9341 Metabolic encephalopathy: Secondary | ICD-10-CM | POA: Diagnosis not present

## 2021-08-08 DIAGNOSIS — E041 Nontoxic single thyroid nodule: Secondary | ICD-10-CM | POA: Diagnosis not present

## 2021-08-08 DIAGNOSIS — E872 Acidosis, unspecified: Secondary | ICD-10-CM | POA: Diagnosis not present

## 2021-08-08 DIAGNOSIS — I10 Essential (primary) hypertension: Secondary | ICD-10-CM | POA: Diagnosis not present

## 2021-08-08 DIAGNOSIS — J301 Allergic rhinitis due to pollen: Secondary | ICD-10-CM | POA: Diagnosis not present

## 2021-08-08 DIAGNOSIS — Z9181 History of falling: Secondary | ICD-10-CM | POA: Diagnosis not present

## 2021-08-08 DIAGNOSIS — M1990 Unspecified osteoarthritis, unspecified site: Secondary | ICD-10-CM | POA: Diagnosis not present

## 2021-08-08 DIAGNOSIS — I700 Atherosclerosis of aorta: Secondary | ICD-10-CM | POA: Diagnosis not present

## 2021-08-08 DIAGNOSIS — E1140 Type 2 diabetes mellitus with diabetic neuropathy, unspecified: Secondary | ICD-10-CM | POA: Diagnosis not present

## 2021-08-08 DIAGNOSIS — E785 Hyperlipidemia, unspecified: Secondary | ICD-10-CM | POA: Diagnosis not present

## 2021-08-08 DIAGNOSIS — Z794 Long term (current) use of insulin: Secondary | ICD-10-CM | POA: Diagnosis not present

## 2021-08-08 DIAGNOSIS — I77810 Thoracic aortic ectasia: Secondary | ICD-10-CM | POA: Diagnosis not present

## 2021-08-08 DIAGNOSIS — N390 Urinary tract infection, site not specified: Secondary | ICD-10-CM | POA: Diagnosis not present

## 2021-08-08 DIAGNOSIS — E876 Hypokalemia: Secondary | ICD-10-CM | POA: Diagnosis not present

## 2021-08-08 DIAGNOSIS — Z8673 Personal history of transient ischemic attack (TIA), and cerebral infarction without residual deficits: Secondary | ICD-10-CM | POA: Diagnosis not present

## 2021-08-08 DIAGNOSIS — N179 Acute kidney failure, unspecified: Secondary | ICD-10-CM | POA: Diagnosis not present

## 2021-08-08 DIAGNOSIS — Z7982 Long term (current) use of aspirin: Secondary | ICD-10-CM | POA: Diagnosis not present

## 2021-08-08 DIAGNOSIS — E440 Moderate protein-calorie malnutrition: Secondary | ICD-10-CM | POA: Diagnosis not present

## 2021-08-08 DIAGNOSIS — N1831 Chronic kidney disease, stage 3a: Secondary | ICD-10-CM | POA: Diagnosis not present

## 2021-08-12 ENCOUNTER — Ambulatory Visit: Payer: Medicare Other

## 2021-08-12 DIAGNOSIS — E1122 Type 2 diabetes mellitus with diabetic chronic kidney disease: Secondary | ICD-10-CM

## 2021-08-12 DIAGNOSIS — I10 Essential (primary) hypertension: Secondary | ICD-10-CM

## 2021-08-12 DIAGNOSIS — N1831 Type 2 diabetes mellitus with diabetic chronic kidney disease: Secondary | ICD-10-CM

## 2021-08-12 NOTE — Chronic Care Management (AMB) (Signed)
Chronic Care Management    Social Work Note  08/12/2021 Name: Anna Hoffman MRN: [id number] DOB: [birth year]  Anna Hoffman is a 87 y.o. year old female who is a primary care patient of Glendale Chard, MD. The CCM team was consulted to assist the patient with chronic disease management and/or care coordination needs related to:  HTN, DM II .   Collaboration with [place]  for  status update of Medicaid application  in response to provider referral for social work chronic care management and care coordination services.   Consent to Services:  The patient was given information about Chronic Care Management services, agreed to services, and gave verbal consent prior to initiation of services.  Please see initial visit note for detailed documentation.   Patient agreed to services and consent obtained.   Assessment: Review of patient past medical history, allergies, medications, and health status, including review of relevant consultants reports was performed today as part of a comprehensive evaluation and provision of chronic care management and care coordination services.     SDOH (Social Determinants of Health) assessments and interventions performed:    Advanced Directives Status: Not addressed in this encounter.  CCM Care Plan  Allergies  Allergen Reactions   Amoxicillin Diarrhea    Has patient had a PCN reaction causing immediate rash, facial/tongue/throat swelling, SOB or lightheadedness with hypotension: no Has patient had a PCN reaction causing severe rash involving mucus membranes or skin necrosis: no Has patient had a PCN reaction that required hospitalization: no pharmacist consult Has patient had a PCN reaction occurring within the last 10 years: yes If all of the above answers are "NO", then may proceed with Cephalosporin use.    Cefepime     Possible CNS toxicity 05/2021   Ampicillin Rash    - Tolerates Rocephin and Ancef - Remote occurrence; no symptoms of  anaphylaxis or severe cutaneous reaction, and no additional medical attention required     Sulfa Antibiotics Rash    Outpatient Encounter Medications as of 08/12/2021  Medication Sig   acetaminophen (TYLENOL) 325 MG tablet Take 2 tablets (650 mg total) by mouth every 6 (six) hours as needed for mild pain (or Fever >/= 101). (Patient not taking: Reported on 07/15/2021)   amLODipine (NORVASC) 10 MG tablet Take 1 tablet (10 mg total) by mouth daily.   aspirin 81 MG EC tablet Take 1 tablet (81 mg total) by mouth daily. Swallow whole.   Vitamin D3 (VITAMIN D) 25 MCG tablet Take 1 tablet (1,000 Units total) by mouth daily.   diclofenac Sodium (VOLTAREN) 1 % GEL Apply 2 g topically every 12 (twelve) hours to left ankle and right shoulder pain   fexofenadine (ALLEGRA ALLERGY) 180 MG tablet Take 1 tablet (180 mg total) by mouth daily.   glucose blood test strip Use as directed to check blood sugars 1 time per day.   glucose monitoring kit (FREESTYLE) monitoring kit Use as directed to check blood sugars 1 time per day dx: e11.22   hydrALAZINE (APRESOLINE) 100 MG tablet Take 1 tablet (100 mg total) by mouth every 8 (eight) hours.   insulin aspart (NOVOLOG) 100 UNIT/ML FlexPen Inject into the skin with meals per sliding scale. 151-200= 2 units, 201-250= 4 units, 251-300= 6 units, 301-350=8 units, 351-400=10 units, >401=10 units and notify MD (Patient taking differently: Inject 2-10 Units into the skin in the morning and at bedtime.)   insulin aspart (NOVOLOG) 100 UNIT/ML injection Inject 0-9 Units into the skin  3 (three) times daily with meals. (Patient not taking: Reported on 07/10/2021)   Lancets (ONETOUCH DELICA PLUS [id number]) MISC Use to check blood sugar once daily   metoprolol tartrate (LOPRESSOR) 25 MG tablet Take 0.5 tablets (12.5 mg total) by mouth 2 (two) times daily.   pantoprazole (PROTONIX) 40 MG tablet Take 1 tablet (40 mg total) by mouth 2 (two) times daily.   prochlorperazine (COMPAZINE) 5 MG  tablet Take 1 tablet (5 mg total) by mouth every 6 (six) hours as needed for nausea or vomiting. (Patient not taking: Reported on 07/10/2021)   rosuvastatin (CRESTOR) 20 MG tablet Take 1 tablet (20 mg total) by mouth daily.   sucralfate (CARAFATE) 1 GM/10ML suspension Take 10 mLs (1 g total) by mouth 4 (four) times daily -  with meals and at bedtime for 28 days   Facility-Administered Encounter Medications as of 08/12/2021  Medication   cyanocobalamin ((VITAMIN B-12)) injection 1,000 mcg    Patient Active Problem List   Diagnosis Date Noted   Generalized weakness 32/91/9166   Acute metabolic encephalopathy    Malnutrition of moderate degree 05/24/2021   Sepsis ([place]) 05/23/2021   SIRS (systemic inflammatory response syndrome) ([place]) 05/22/2021   History of CVA (cerebrovascular accident) 05/22/2021   Gait instability 04/24/2021   Seasonal allergic rhinitis due to pollen 04/24/2021   Ascending aorta dilatation ([place]) 04/05/2021   HOCM (hypertrophic obstructive cardiomyopathy) ([place]) 04/04/2021   Physical deconditioning 04/04/2021   Syncope 04/03/2021   Fall at home, initial encounter 03/31/2021   Thyroid nodule 03/31/2021   CAP (community acquired pneumonia) 11/15/2020   Dehydration 11/15/2020   Erosive esophagitis    Hiatal hernia    Hyponatremia 10/10/2020   Acute kidney injury superimposed on chronic kidney disease ([place]) 10/10/2020   Aortic atherosclerosis ([place]) 10/09/2020   Acute renal failure superimposed on stage 3a chronic kidney disease ([place]) 10/09/2020   Hyperkalemia 10/09/2020   Hearing loss 10/09/2020   Prolonged QT interval 10/09/2020   Colitis, acute 08/11/2019   Colitis 07/11/2019   Loose stools 06/07/2019   Herpes zoster without complication 06/00/4599   Other long term (current) drug therapy 08/12/2018   Chronic kidney disease, stage 3a ([place]) 07/19/2018   Hypertensive nephropathy 07/19/2018   Community acquired pneumonia of right upper lobe of lung 11/07/2017    Hypertensive emergency 07/01/2016   Thalamic hemorrhage ([place]) 07/01/2016   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[place])    Benign essential HTN    Type 2 diabetes mellitus with stage 3 chronic kidney disease, without long-term current use of insulin ([place])    Mixed hyperlipidemia    ICH (intracerebral hemorrhage) ([place]) - hypertensive R thalamic hemorrhage  06/27/2016   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   Retroperitoneal bleed 02/08/2016   Generalized abdominal pain    Gastroenteritis 02/04/2016   Diarrhea 02/04/2016   Hypokalemia 02/04/2016   Incarcerated paraesophageal hernia 09/02/2014   Renal mass, left 09/02/2014   Acute esophagitis 09/02/2014   Gastric outlet obstruction 09/02/2014   Hypertension    Vomiting 09/01/2014    Conditions to be addressed/monitored: HTN and DMII; Level of care concerns  Care Plan : Social Work Care Plan  Updates made by Daneen Schick since 08/12/2021 12:00 AM     Problem: Mobility and Independence      Goal: Mobility and Independence Optimized   Start Date: 04/24/2021  Expected End Date: 06/08/2021  Recent Progress: On track  Priority: High  Note:   Current Barriers:  Chronic disease management support and education needs related  to HTN and DM  ADL IADL limitations and Limited access to caregiver  Social Worker Clinical Goal(s):  patient will work with SW to identify and address any acute and/or chronic care coordination needs related to the self health management of HTN and DM  patient will work with SW to address concerns related to level of care needs  SW Interventions:  Inter-disciplinary care team collaboration (see longitudinal plan of care) Collaboration with Glendale Chard, MD regarding development and update of comprehensive plan of care as evidenced by provider attestation and co-signature Voice message received from [employer] with [facility] indicating the business office manager has left a voice message with DSS  requesting the status of patients Medicaid application for long term care. Mrs. Ola Spurr stated patient would only be offered a private pay bed until her LTC Medicaid is approved  Attempted to contact DSS to follow up on status of patients application - voice message left SW will follow up with DSS over the next 14 days if a return call is not received  Patient Goals/Self-Care Activities patient will: with the help of her son  -  Review pros and cons for placement versus remaining in the home  Follow Up Plan:  SW will follow up with the patient over the next 14 days      Follow Up Plan: SW will follow up with patient by phone over the next 14 days.      Daneen Schick, BSW, CDP Social Worker, Certified Dementia Practitioner [place] [facility] [place] [facility] [phone]

## 2021-08-12 NOTE — Patient Instructions (Signed)
Social Worker Visit Information   Materials Provided: No. Patient not reached.   Follow Up Plan: SW will follow up with patient by phone over the next 14 days.   Daneen Schick, BSW, CDP Social Worker, Certified Dementia Practitioner [place] [facility] [place] [facility] [phone]

## 2021-08-13 ENCOUNTER — Inpatient Hospital Stay: Payer: Medicare Other | Admitting: Internal Medicine

## 2021-08-13 DIAGNOSIS — H9190 Unspecified hearing loss, unspecified ear: Secondary | ICD-10-CM | POA: Diagnosis not present

## 2021-08-13 DIAGNOSIS — G9341 Metabolic encephalopathy: Secondary | ICD-10-CM | POA: Diagnosis not present

## 2021-08-13 DIAGNOSIS — N1831 Chronic kidney disease, stage 3a: Secondary | ICD-10-CM | POA: Diagnosis not present

## 2021-08-13 DIAGNOSIS — J301 Allergic rhinitis due to pollen: Secondary | ICD-10-CM | POA: Diagnosis not present

## 2021-08-13 DIAGNOSIS — E440 Moderate protein-calorie malnutrition: Secondary | ICD-10-CM | POA: Diagnosis not present

## 2021-08-13 DIAGNOSIS — Z7982 Long term (current) use of aspirin: Secondary | ICD-10-CM | POA: Diagnosis not present

## 2021-08-13 DIAGNOSIS — I10 Essential (primary) hypertension: Secondary | ICD-10-CM | POA: Diagnosis not present

## 2021-08-13 DIAGNOSIS — E1122 Type 2 diabetes mellitus with diabetic chronic kidney disease: Secondary | ICD-10-CM | POA: Diagnosis not present

## 2021-08-13 DIAGNOSIS — N179 Acute kidney failure, unspecified: Secondary | ICD-10-CM | POA: Diagnosis not present

## 2021-08-13 DIAGNOSIS — I421 Obstructive hypertrophic cardiomyopathy: Secondary | ICD-10-CM | POA: Diagnosis not present

## 2021-08-13 DIAGNOSIS — E1140 Type 2 diabetes mellitus with diabetic neuropathy, unspecified: Secondary | ICD-10-CM | POA: Diagnosis not present

## 2021-08-13 DIAGNOSIS — Z9181 History of falling: Secondary | ICD-10-CM | POA: Diagnosis not present

## 2021-08-13 DIAGNOSIS — E785 Hyperlipidemia, unspecified: Secondary | ICD-10-CM | POA: Diagnosis not present

## 2021-08-13 DIAGNOSIS — Z794 Long term (current) use of insulin: Secondary | ICD-10-CM | POA: Diagnosis not present

## 2021-08-13 DIAGNOSIS — Z8673 Personal history of transient ischemic attack (TIA), and cerebral infarction without residual deficits: Secondary | ICD-10-CM | POA: Diagnosis not present

## 2021-08-13 DIAGNOSIS — E876 Hypokalemia: Secondary | ICD-10-CM | POA: Diagnosis not present

## 2021-08-13 DIAGNOSIS — I77810 Thoracic aortic ectasia: Secondary | ICD-10-CM | POA: Diagnosis not present

## 2021-08-13 DIAGNOSIS — I700 Atherosclerosis of aorta: Secondary | ICD-10-CM | POA: Diagnosis not present

## 2021-08-13 DIAGNOSIS — M1990 Unspecified osteoarthritis, unspecified site: Secondary | ICD-10-CM | POA: Diagnosis not present

## 2021-08-13 DIAGNOSIS — N390 Urinary tract infection, site not specified: Secondary | ICD-10-CM | POA: Diagnosis not present

## 2021-08-13 DIAGNOSIS — E872 Acidosis, unspecified: Secondary | ICD-10-CM | POA: Diagnosis not present

## 2021-08-13 DIAGNOSIS — E041 Nontoxic single thyroid nodule: Secondary | ICD-10-CM | POA: Diagnosis not present

## 2021-08-13 NOTE — Progress Notes (Signed)
Cardiology Clinic Note   Patient Name: Anna Hoffman Date of Encounter: 08/16/2021  Primary Care Provider:  Glendale Chard, MD Primary Cardiologist:  Donato Heinz, MD  Patient Profile    Anna Hoffman 87 year old female presents the clinic today for follow-up evaluation of her hypertrophic cardiomyopathy, syncope, and essential hypertension.  Past Medical History    Past Medical History:  Diagnosis Date   Arthritis    Diabetes mellitus without complication ([place])    Hypertension    Hypokalemia    Pneumonia    Shingles    Stroke [facility])    Past Surgical History:  Procedure Laterality Date   ABDOMINAL HYSTERECTOMY     BREAST EXCISIONAL BIOPSY Left    ESOPHAGOGASTRODUODENOSCOPY N/A 09/01/2014   Procedure: ESOPHAGOGASTRODUODENOSCOPY (EGD);  Surgeon: Lear Ng, MD;  Location: [facility];  Service: Endoscopy;  Laterality: N/A;   ESOPHAGOGASTRODUODENOSCOPY (EGD) WITH PROPOFOL N/A 10/12/2020   Procedure: ESOPHAGOGASTRODUODENOSCOPY (EGD) WITH PROPOFOL;  Surgeon: Mauri Pole, MD;  Location: [facility];  Service: Endoscopy;  Laterality: N/A;   HIATAL HERNIA REPAIR N/A 09/04/2014   Procedure: LAPAROSCOPIC REPAIR OF HIATAL HERNIA;  Surgeon: Excell Seltzer, MD;  Location: [facility];  Service: General;  Laterality: N/A;  With MESH   IR GENERIC HISTORICAL  04/10/2016   IR US GUIDE VASC ACCESS RIGHT 04/10/2016 Corrie Mckusick, DO [facility]   IR GENERIC HISTORICAL  04/10/2016   IR ANGIOGRAM SELECTIVE EACH ADDITIONAL VESSEL 04/10/2016 Corrie Mckusick, DO [facility]   IR GENERIC HISTORICAL  04/10/2016   IR ANGIOGRAM SELECTIVE EACH ADDITIONAL VESSEL 04/10/2016 Corrie Mckusick, DO [facility]   IR GENERIC HISTORICAL  04/10/2016   IR EMBO TUMOR ORGAN ISCHEMIA INFARCT INC GUIDE ROADMAPPING 04/10/2016 Corrie Mckusick, DO [facility]   IR GENERIC HISTORICAL  04/10/2016   IR ANGIOGRAM SELECTIVE EACH ADDITIONAL VESSEL 04/10/2016 Corrie Mckusick, DO [facility]   IR GENERIC HISTORICAL   04/10/2016   IR ANGIOGRAM SELECTIVE EACH ADDITIONAL VESSEL 04/10/2016 Corrie Mckusick, DO [facility]   IR GENERIC HISTORICAL  04/10/2016   IR RENAL SELECTIVE  UNI INC S&I MOD SED 04/10/2016 Corrie Mckusick, DO [facility]   IR GENERIC HISTORICAL  03/06/2016   IR RADIOLOGIST EVAL & MGMT 03/06/2016 Corrie Mckusick, DO [facility]   IR GENERIC HISTORICAL  03/26/2016   IR RADIOLOGIST EVAL & MGMT 03/26/2016 Corrie Mckusick, DO [facility]   IR GENERIC HISTORICAL  04/29/2016   IR RADIOLOGIST EVAL & MGMT 04/29/2016 [facility]   IR RADIOLOGIST EVAL & MGMT  11/12/2016   IR RADIOLOGIST EVAL & MGMT  12/16/2017   KNEE SURGERY     TONSILLECTOMY      Allergies  Allergies  Allergen Reactions   Amoxicillin Diarrhea    Has patient had a PCN reaction causing immediate rash, facial/tongue/throat swelling, SOB or lightheadedness with hypotension: no Has patient had a PCN reaction causing severe rash involving mucus membranes or skin necrosis: no Has patient had a PCN reaction that required hospitalization: no pharmacist consult Has patient had a PCN reaction occurring within the last 10 years: yes If all of the above answers are "NO", then may proceed with Cephalosporin use.    Cefepime     Possible CNS toxicity 05/2021   Ampicillin Rash    - Tolerates Rocephin and Ancef - Remote occurrence; no symptoms of anaphylaxis or severe cutaneous reaction, and no additional medical attention required     Sulfa Antibiotics Rash    History of Present Illness  Anna Hoffman has a PMH of essential hypertension, CVA, aortic atherosclerosis, hypertrophic obstructive cardiomyopathy, ascending aortic dilation, type 2 diabetes, CKD stage III, hypokalemia, syncope, and generalized weakness.  Echocardiogram 8/22 showed severe basal septal hypertrophy, hyperdynamic LV systolic function, normal RV function, mild AI, and mild dilation of the ascending aorta measuring 41 mm.  Her cardiac MRI 8/22 showed asymmetric LV  hypertrophy measuring 19 mm and basal septum 11 mm.  Her LGE accounted for 2% of her total myocardial mass.  She wore a cardiac event monitor for 14 days which showed 1 run of NSVT that lasted for 4 beats, and Wenckebach.  She was seen by Dr. Lovena Le on 10/22.  It was suspected that her syncope was due to heart block with 2-1 AV block during admission noted 3/22.  It was felt that she would need a pacemaker but the patient declined at the time.  It was recommended that she consider a loop recorder.   She was seen by Dr. Gardiner Rhyme on 05/16/2021.  During the visit she reported that she had a syncopal episode.  This week.  She had a sudden loss of consciousness.  She saw Dr. Lovena Le recommended pacemaker and she was considering the offer.  She denied chest pain or shortness of breath, lower extremity swelling, and palpitations.  She was admitted to the hospital 07/13/2021 and discharged on 07/15/2021.  She was diagnosed with generalized weakness.  She had previously been admitted to the hospital with acute CVA and altered mental status complicated by AKI.  She had been discharged to rehab.  She was taken back home due to generalized weakness and poor oral intake.  There was suspicion for UTI and dehydration.  She was started on Rocephin.  Her culture showed ESBL.  She received antibiotics and was seen by physical therapy who recommended home health.  She reported that she felt the best she had and months and was ready to be discharged home.  She presents the clinic today for follow-up evaluation states she feels well today.  She presents with her son.  We reviewed her most recent hospitalization and her echocardiogram.  They expressed understanding.  She is ready to proceed with consideration for pacemaker implantation.  She was initially reluctant and did not wish to proceed with implantation but has considered loop recorder implantation versus pacemaker implantation.  Wishes to discuss PPM with EP further.  She  is currently doing physical therapy at home for her right shoulder.  She has limited mobility after her most recent fall.  I will continue her current medication regimen, have her increase her physical activity as tolerated, and plan follow-up for 4 months.  Today showing denies chest pain, shortness of breath, lower extremity edema, fatigue, palpitations, melena, hematuria, hemoptysis, diaphoresis, weakness, presyncope, syncope, orthopnea, and PND.   Home Medications    Prior to Admission medications   Medication Sig Start Date End Date Taking? Authorizing Provider  acetaminophen (TYLENOL) 325 MG tablet Take 2 tablets (650 mg total) by mouth every 6 (six) hours as needed for mild pain (or Fever >/= 101). Patient not taking: Reported on 07/15/2021 02/08/16   Irwin Brakeman L, MD  amLODipine (NORVASC) 10 MG tablet Take 1 tablet (10 mg total) by mouth daily. 06/26/21     aspirin 81 MG EC tablet Take 1 tablet (81 mg total) by mouth daily. Swallow whole. 07/15/21   Amin, Jeanella Flattery, MD  Vitamin D3 (VITAMIN D) 25 MCG tablet Take 1 tablet (1,000 Units total) by  mouth daily. 07/15/21 08/14/21  Damita Lack, MD  diclofenac Sodium (VOLTAREN) 1 % GEL Apply 2 g topically every 12 (twelve) hours to left ankle and right shoulder pain 06/26/21     fexofenadine (ALLEGRA ALLERGY) 180 MG tablet Take 1 tablet (180 mg total) by mouth daily. 07/18/21   Glendale Chard, MD  glucose blood test strip Use as directed to check blood sugars 1 time per day. 03/01/21   Glendale Chard, MD  glucose monitoring kit (FREESTYLE) monitoring kit Use as directed to check blood sugars 1 time per day dx: e11.22 03/01/21   Glendale Chard, MD  hydrALAZINE (APRESOLINE) 100 MG tablet Take 1 tablet (100 mg total) by mouth every 8 (eight) hours. 06/26/21     insulin aspart (NOVOLOG) 100 UNIT/ML FlexPen Inject into the skin with meals per sliding scale. 151-200= 2 units, 201-250= 4 units, 251-300= 6 units, 301-350=8 units, 351-400=10  units, >401=10 units and notify MD Patient taking differently: Inject 2-10 Units into the skin in the morning and at bedtime. 06/26/21     insulin aspart (NOVOLOG) 100 UNIT/ML injection Inject 0-9 Units into the skin 3 (three) times daily with meals. Patient not taking: Reported on 07/10/2021 06/06/21   Donne Hazel, MD  Lancets [facility] DELICA PLUS [id number]) MISC Use to check blood sugar once daily 02/07/21     metoprolol tartrate (LOPRESSOR) 25 MG tablet Take 0.5 tablets (12.5 mg total) by mouth 2 (two) times daily. 07/18/21   Glendale Chard, MD  pantoprazole (PROTONIX) 40 MG tablet Take 1 tablet (40 mg total) by mouth 2 (two) times daily. 06/26/21     prochlorperazine (COMPAZINE) 5 MG tablet Take 1 tablet (5 mg total) by mouth every 6 (six) hours as needed for nausea or vomiting. Patient not taking: Reported on 07/10/2021 05/22/21   Minette Brine, FNP  rosuvastatin (CRESTOR) 20 MG tablet Take 1 tablet (20 mg total) by mouth daily. 07/18/21   Glendale Chard, MD  sucralfate (CARAFATE) 1 GM/10ML suspension Take 10 mLs (1 g total) by mouth 4 (four) times daily -  with meals and at bedtime for 28 days 07/10/21   Glendale Chard, MD    Family History    Family History  Problem Relation Age of Onset   Alzheimer's disease Mother    Prostate cancer Father    Brain cancer Brother    Brain cancer Brother    Pancreatic cancer Sister    Allergic rhinitis Neg Hx    Asthma Neg Hx    Eczema Neg Hx    Urticaria Neg Hx    She indicated that her mother is deceased. She indicated that her father is deceased. She indicated that her sister is deceased. She indicated that both of her brothers are deceased. She indicated that the status of her neg hx is unknown.  Social History    Social History   Socioeconomic History   Marital status: Widowed    Spouse name: Not on file   Number of children: 2   Years of education: Not on file   Highest education level: Not on file  Occupational History    Occupation: retired  Tobacco Use   Smoking status: Never   Smokeless tobacco: Never  Vaping Use   Vaping Use: Never used  Substance and Sexual Activity   Alcohol use: No   Drug use: No   Sexual activity: Not Currently    Birth control/protection: Surgical  Other Topics Concern   Not on file  Social History Narrative  Son [name] lives with her   Social Determinants of Health   Financial Resource Strain: Low Risk    Difficulty of Paying Living Expenses: Not hard at all  Food Insecurity: No Food Insecurity   Worried About Charity fundraiser in the Last Year: Never true   Arboriculturist in the Last Year: Never true  Transportation Needs: No Transportation Needs   Lack of Transportation (Medical): No   Lack of Transportation (Non-Medical): No  Physical Activity: Sufficiently Active   Days of Exercise per Week: 6 days   Minutes of Exercise per Session: 30 min  Stress: No Stress Concern Present   Feeling of Stress : Not at all  Social Connections: Not on file  Intimate Partner Violence: Not on file     Review of Systems    General:  No chills, fever, night sweats or weight changes.  Cardiovascular:  No chest pain, dyspnea on exertion, edema, orthopnea, palpitations, paroxysmal nocturnal dyspnea. Dermatological: No rash, lesions/masses Respiratory: No cough, dyspnea Urologic: No hematuria, dysuria Abdominal:   No nausea, vomiting, diarrhea, bright red blood per rectum, melena, or hematemesis Neurologic:  No visual changes, wkns, changes in mental status. All other systems reviewed and are otherwise negative except as noted above.  Physical Exam    VS:  BP (!) 142/71    Pulse 99    Ht 5' 5.5" (1.664 m)    Wt 127 lb 6.4 oz (57.8 kg)    LMP  (LMP Unknown)    SpO2 96%    BMI 20.88 kg/m  , BMI Body mass index is 20.88 kg/m. GEN: Well nourished, well developed, in no acute distress. HEENT: normal. Neck: Supple, no JVD, carotid bruits, or masses. Cardiac: RRR, no murmurs,  rubs, or gallops. No clubbing, cyanosis, edema.  Radials/DP/PT 2+ and equal bilaterally.  Respiratory:  Respirations regular and unlabored, clear to auscultation bilaterally. GI: Soft, nontender, nondistended, BS + x 4. MS: no deformity or atrophy. Skin: warm and dry, no rash. Neuro:  Strength and sensation are intact. Psych: Normal affect.  Accessory Clinical Findings    Recent Labs: 07/13/2021: ALT 13; TSH 1.098 07/15/2021: BUN 13; Creatinine, Ser 1.35; Hemoglobin 9.5; Magnesium 1.9; Platelets 429; Potassium 3.7; Sodium 136   Recent Lipid Panel    Component Value Date/Time   CHOL 139 05/27/2021 1645   CHOL 150 10/18/2020 1549   TRIG 89 05/27/2021 1645   HDL 74 05/27/2021 1645   HDL 74 10/18/2020 1549   CHOLHDL 1.9 05/27/2021 1645   VLDL 18 05/27/2021 1645   LDLCALC 47 05/27/2021 1645   LDLCALC 51 10/18/2020 1549    ECG personally reviewed by me today-none today.  Echocardiogram 04/01/2021 1. Left ventricular ejection fraction, by estimation, is 65 to 70%. The  left ventricle has hyperdynamic function. The left ventricle has no  regional wall motion abnormalities. There is severe focal basal septal  left ventricular hypertrophy, no LV  outflow tract gradient. Left ventricular diastolic parameters are  consistent with Grade I diastolic dysfunction (impaired relaxation). The  average left ventricular global longitudinal strain is -20.7 %. The global  longitudinal strain is normal.   2. Right ventricular systolic function is normal. The right ventricular  size is normal. There is normal pulmonary artery systolic pressure. The  estimated right ventricular systolic pressure is 88.4 mmHg.   3. Left atrial size was mildly dilated.   4. The mitral valve is normal in structure. Trivial mitral valve  regurgitation. No evidence of mitral  stenosis.   5. The aortic valve is tricuspid. Aortic valve regurgitation is mild.  Mild aortic valve sclerosis is present, with no evidence of  aortic valve  stenosis.   6. Aortic dilatation noted. There is mild dilatation of the ascending  aorta, measuring 41 mm.   7. The inferior vena cava is normal in size with greater than 50%  respiratory variability, suggesting right atrial pressure of 3 mmHg Assessment & Plan   1.  Sinus tachycardia-heart rate today 99 bpm.  Denies recent episodes of accelerated heartbeat.  Beta-blocker added during 10/22 hospital admission after being transitioned off of IV Lopressor. Continue metoprolol Heart healthy low-sodium diet Increase physical activity as tolerated Avoid caffeine, chocolate, dehydration, EtOH etc.  First-degree AV block-denies recent episodes of presyncope or syncope.  Seen and evaluated by EP who offered PPM.  Patient did not wish to undergo implantation previously but wishes to be seen again by EP for reevaluation for PPM. Continue metoprolol  Hypertrophic cardiomyopathy-echocardiogram showed hyperdynamic systolic function and left ventricular hypertrophy along with G1 DD. cardiac MRI 8/22 showed asymmetric LV hypertrophy measuring 19 mm and basal septum 11 mm.  Her LGE accounted for 2% of her total myocardial mass.  Continue metoprolol Heart healthy low-sodium diet Increase physical activity as tolerated Maintain p.o. hydration-reviewed importance of p.o. fluid intake.  Essential hypertension-BP today 142/72.  Well-controlled at home. Continue amlodipine, hydralazine, metoprolol Heart healthy low-sodium diet-salty 6 given Increase physical activity as tolerated  CKD stage III-creatinine 1.35 on 07/15/2021 which is around baseline. Follows with PCP  Disposition: Follow-up with Dr. Gardiner Rhyme in 4 months.  Jossie Ng. Tyrianna Lightle NP-C    08/16/2021, 4:29 PM [address] Office [phone] Fax [phone]  Notice: This dictation was prepared with Dragon dictation along with smaller phrase technology. Any transcriptional  errors that result from this process are unintentional and may not be corrected upon review.  I spent 15 minutes examining this patient, reviewing medications, and using patient centered shared decision making involving her cardiac care.  Prior to her visit I spent greater than 20 minutes reviewing her past medical history,  medications, and prior cardiac tests.

## 2021-08-14 ENCOUNTER — Other Ambulatory Visit (HOSPITAL_COMMUNITY): Payer: Self-pay

## 2021-08-15 DIAGNOSIS — N179 Acute kidney failure, unspecified: Secondary | ICD-10-CM | POA: Diagnosis not present

## 2021-08-15 DIAGNOSIS — G9341 Metabolic encephalopathy: Secondary | ICD-10-CM | POA: Diagnosis not present

## 2021-08-15 DIAGNOSIS — E872 Acidosis, unspecified: Secondary | ICD-10-CM | POA: Diagnosis not present

## 2021-08-15 DIAGNOSIS — E876 Hypokalemia: Secondary | ICD-10-CM | POA: Diagnosis not present

## 2021-08-15 DIAGNOSIS — I421 Obstructive hypertrophic cardiomyopathy: Secondary | ICD-10-CM | POA: Diagnosis not present

## 2021-08-15 DIAGNOSIS — E1122 Type 2 diabetes mellitus with diabetic chronic kidney disease: Secondary | ICD-10-CM | POA: Diagnosis not present

## 2021-08-15 DIAGNOSIS — I700 Atherosclerosis of aorta: Secondary | ICD-10-CM | POA: Diagnosis not present

## 2021-08-15 DIAGNOSIS — I77810 Thoracic aortic ectasia: Secondary | ICD-10-CM | POA: Diagnosis not present

## 2021-08-15 DIAGNOSIS — M1990 Unspecified osteoarthritis, unspecified site: Secondary | ICD-10-CM | POA: Diagnosis not present

## 2021-08-15 DIAGNOSIS — Z9181 History of falling: Secondary | ICD-10-CM | POA: Diagnosis not present

## 2021-08-15 DIAGNOSIS — Z8673 Personal history of transient ischemic attack (TIA), and cerebral infarction without residual deficits: Secondary | ICD-10-CM | POA: Diagnosis not present

## 2021-08-15 DIAGNOSIS — I10 Essential (primary) hypertension: Secondary | ICD-10-CM | POA: Diagnosis not present

## 2021-08-15 DIAGNOSIS — Z7982 Long term (current) use of aspirin: Secondary | ICD-10-CM | POA: Diagnosis not present

## 2021-08-15 DIAGNOSIS — N390 Urinary tract infection, site not specified: Secondary | ICD-10-CM | POA: Diagnosis not present

## 2021-08-15 DIAGNOSIS — N1831 Chronic kidney disease, stage 3a: Secondary | ICD-10-CM | POA: Diagnosis not present

## 2021-08-15 DIAGNOSIS — H9190 Unspecified hearing loss, unspecified ear: Secondary | ICD-10-CM | POA: Diagnosis not present

## 2021-08-15 DIAGNOSIS — E1140 Type 2 diabetes mellitus with diabetic neuropathy, unspecified: Secondary | ICD-10-CM | POA: Diagnosis not present

## 2021-08-15 DIAGNOSIS — E785 Hyperlipidemia, unspecified: Secondary | ICD-10-CM | POA: Diagnosis not present

## 2021-08-15 DIAGNOSIS — Z794 Long term (current) use of insulin: Secondary | ICD-10-CM | POA: Diagnosis not present

## 2021-08-15 DIAGNOSIS — E041 Nontoxic single thyroid nodule: Secondary | ICD-10-CM | POA: Diagnosis not present

## 2021-08-15 DIAGNOSIS — J301 Allergic rhinitis due to pollen: Secondary | ICD-10-CM | POA: Diagnosis not present

## 2021-08-15 DIAGNOSIS — E440 Moderate protein-calorie malnutrition: Secondary | ICD-10-CM | POA: Diagnosis not present

## 2021-08-16 ENCOUNTER — Other Ambulatory Visit (HOSPITAL_COMMUNITY): Payer: Self-pay

## 2021-08-16 ENCOUNTER — Other Ambulatory Visit: Payer: Self-pay

## 2021-08-16 ENCOUNTER — Ambulatory Visit: Payer: Medicare Other | Admitting: General Practice

## 2021-08-16 ENCOUNTER — Encounter: Payer: Self-pay | Admitting: General Practice

## 2021-08-16 VITALS — BP 142/71 | HR 99 | Ht 65.5 in | Wt 127.4 lb

## 2021-08-16 DIAGNOSIS — R000 Tachycardia, unspecified: Secondary | ICD-10-CM

## 2021-08-16 DIAGNOSIS — I440 Atrioventricular block, first degree: Secondary | ICD-10-CM

## 2021-08-16 DIAGNOSIS — I422 Other hypertrophic cardiomyopathy: Secondary | ICD-10-CM

## 2021-08-16 DIAGNOSIS — I10 Essential (primary) hypertension: Secondary | ICD-10-CM

## 2021-08-16 DIAGNOSIS — N183 Chronic kidney disease, stage 3 unspecified: Secondary | ICD-10-CM | POA: Diagnosis not present

## 2021-08-16 NOTE — Patient Instructions (Signed)
Medication Instructions:  The current medical regimen is effective;  continue present plan and medications as directed. Please refer to the Current Medication list given to you today.   *If you need a refill on your cardiac medications before your next appointment, please call your pharmacy*  Lab Work:   Testing/Procedures:  NONE    NONE  Special Instructions PLEASE CONTINUE PHYSICAL THERAPY  PLEASE INCREASE PHYSICAL ACTIVITY AS TOLERATED   Follow-Up: Your next appointment:  4 month(s) In Person with Donato Heinz, MD  AND 1 MONTH AT [facility] WITH DR [place] [facility], you and your health needs are our priority.  As part of our continuing mission to provide you with exceptional heart care, we have created designated Provider Care Teams.  These Care Teams include your primary Cardiologist (physician) and Advanced Practice Providers (APPs -  Physician Assistants and Nurse Practitioners) who all work together to provide you with the care you need, when you need it.

## 2021-08-19 ENCOUNTER — Other Ambulatory Visit (HOSPITAL_COMMUNITY): Payer: Self-pay

## 2021-08-20 ENCOUNTER — Ambulatory Visit (INDEPENDENT_AMBULATORY_CARE_PROVIDER_SITE_OTHER): Payer: Medicare Other | Admitting: Internal Medicine

## 2021-08-20 ENCOUNTER — Other Ambulatory Visit: Payer: Self-pay

## 2021-08-20 ENCOUNTER — Encounter: Payer: Self-pay | Admitting: Internal Medicine

## 2021-08-20 VITALS — BP 116/72 | HR 105 | Temp 98.5°F | Ht 65.5 in

## 2021-08-20 DIAGNOSIS — E785 Hyperlipidemia, unspecified: Secondary | ICD-10-CM | POA: Diagnosis not present

## 2021-08-20 DIAGNOSIS — H9190 Unspecified hearing loss, unspecified ear: Secondary | ICD-10-CM | POA: Diagnosis not present

## 2021-08-20 DIAGNOSIS — R350 Frequency of micturition: Secondary | ICD-10-CM

## 2021-08-20 DIAGNOSIS — E440 Moderate protein-calorie malnutrition: Secondary | ICD-10-CM | POA: Diagnosis not present

## 2021-08-20 DIAGNOSIS — I10 Essential (primary) hypertension: Secondary | ICD-10-CM | POA: Diagnosis not present

## 2021-08-20 DIAGNOSIS — N390 Urinary tract infection, site not specified: Secondary | ICD-10-CM | POA: Diagnosis not present

## 2021-08-20 DIAGNOSIS — E1122 Type 2 diabetes mellitus with diabetic chronic kidney disease: Secondary | ICD-10-CM | POA: Diagnosis not present

## 2021-08-20 DIAGNOSIS — G9341 Metabolic encephalopathy: Secondary | ICD-10-CM | POA: Diagnosis not present

## 2021-08-20 DIAGNOSIS — I421 Obstructive hypertrophic cardiomyopathy: Secondary | ICD-10-CM | POA: Diagnosis not present

## 2021-08-20 DIAGNOSIS — E872 Acidosis, unspecified: Secondary | ICD-10-CM | POA: Diagnosis not present

## 2021-08-20 DIAGNOSIS — E041 Nontoxic single thyroid nodule: Secondary | ICD-10-CM | POA: Diagnosis not present

## 2021-08-20 DIAGNOSIS — E876 Hypokalemia: Secondary | ICD-10-CM | POA: Diagnosis not present

## 2021-08-20 DIAGNOSIS — Z9181 History of falling: Secondary | ICD-10-CM | POA: Diagnosis not present

## 2021-08-20 DIAGNOSIS — J301 Allergic rhinitis due to pollen: Secondary | ICD-10-CM | POA: Diagnosis not present

## 2021-08-20 DIAGNOSIS — N1831 Chronic kidney disease, stage 3a: Secondary | ICD-10-CM | POA: Diagnosis not present

## 2021-08-20 DIAGNOSIS — H9201 Otalgia, right ear: Secondary | ICD-10-CM

## 2021-08-20 DIAGNOSIS — M1990 Unspecified osteoarthritis, unspecified site: Secondary | ICD-10-CM | POA: Diagnosis not present

## 2021-08-20 DIAGNOSIS — I77810 Thoracic aortic ectasia: Secondary | ICD-10-CM | POA: Diagnosis not present

## 2021-08-20 DIAGNOSIS — J069 Acute upper respiratory infection, unspecified: Secondary | ICD-10-CM

## 2021-08-20 DIAGNOSIS — Z7982 Long term (current) use of aspirin: Secondary | ICD-10-CM | POA: Diagnosis not present

## 2021-08-20 DIAGNOSIS — N179 Acute kidney failure, unspecified: Secondary | ICD-10-CM | POA: Diagnosis not present

## 2021-08-20 DIAGNOSIS — I700 Atherosclerosis of aorta: Secondary | ICD-10-CM | POA: Diagnosis not present

## 2021-08-20 DIAGNOSIS — E1140 Type 2 diabetes mellitus with diabetic neuropathy, unspecified: Secondary | ICD-10-CM | POA: Diagnosis not present

## 2021-08-20 DIAGNOSIS — Z794 Long term (current) use of insulin: Secondary | ICD-10-CM | POA: Diagnosis not present

## 2021-08-20 DIAGNOSIS — Z8673 Personal history of transient ischemic attack (TIA), and cerebral infarction without residual deficits: Secondary | ICD-10-CM | POA: Diagnosis not present

## 2021-08-20 LAB — POCT URINALYSIS DIPSTICK
Bilirubin, UA: NEGATIVE
Blood, UA: NEGATIVE
Glucose, UA: NEGATIVE
Ketones, UA: NEGATIVE
Nitrite, UA: NEGATIVE
Protein, UA: POSITIVE — AB
Spec Grav, UA: 1.015 (ref 1.010–1.025)
Urobilinogen, UA: 0.2 E.U./dL
pH, UA: 6 (ref 5.0–8.0)

## 2021-08-20 LAB — POC COVID19 BINAXNOW: SARS Coronavirus 2 Ag: NEGATIVE

## 2021-08-20 MED ORDER — CEFTRIAXONE SODIUM 500 MG IJ SOLR
500.0000 mg | Freq: Once | INTRAMUSCULAR | Status: AC
  Administered 2021-08-20: 500 mg via INTRAMUSCULAR

## 2021-08-20 NOTE — Progress Notes (Signed)
Rich Brave [place],acting as a Education administrator for Maximino Greenland, MD.,have documented all relevant documentation on the behalf of Maximino Greenland, MD,as directed by  Maximino Greenland, MD while in the presence of Maximino Greenland, MD.  This visit occurred during the SARS-CoV-2 public health emergency.  Safety protocols were in place, including screening questions prior to the visit, additional usage of staff PPE, and extensive cleaning of exam room while observing appropriate contact time as indicated for disinfecting solutions.  Subjective:     Patient ID: Anna Hoffman , female    DOB: 10-08-1934 , 87 y.o.   MRN: [id number]   Chief Complaint  Patient presents with   URI   Dysuria    HPI  She is brought in by her son today for further evaluation of urinary frequency and fatigue. She has recent hospitalization in December 2022 for ESBL UTI treated w/ fosfomycin. She had another hospitalization in October 2022, treated with cefepime which is thought to have caused encephalopathy.   Dysuria     Past Medical History:  Diagnosis Date   Arthritis    Diabetes mellitus without complication ([place])    Hypertension    Hypokalemia    Pneumonia    Shingles    Stroke [facility])      Family History  Problem Relation Age of Onset   Alzheimer's disease Mother    Prostate cancer Father    Brain cancer Brother    Brain cancer Brother    Pancreatic cancer Sister    Allergic rhinitis Neg Hx    Asthma Neg Hx    Eczema Neg Hx    Urticaria Neg Hx     No current facility-administered medications for this visit. No current outpatient medications on file.  Facility-Administered Medications Ordered in Other Visits:    acetaminophen (TYLENOL) tablet 650 mg, 650 mg, Oral, Q6H PRN, Cherene Altes, MD, 650 mg at 08/25/21 0211   aspirin EC tablet 81 mg, 81 mg, Oral, Daily, Cherene Altes, MD, 81 mg at 08/25/21 1036   bisacodyl (DULCOLAX) suppository 10 mg, 10 mg, Rectal, Daily PRN, Cherene Altes,  MD   enoxaparin (LOVENOX) injection 30 mg, 30 mg, Subcutaneous, Q24H, Cherene Altes, MD, 30 mg at 08/25/21 2141   feeding supplement (ENSURE ENLIVE / ENSURE PLUS) liquid 237 mL, 237 mL, Oral, BID BM, Cherene Altes, MD, 237 mL at 08/25/21 1452   hydrALAZINE (APRESOLINE) tablet 100 mg, 100 mg, Oral, Q8H, Joette Catching T, MD, 100 mg at 08/25/21 2138   insulin aspart (novoLOG) injection 0-9 Units, 0-9 Units, Subcutaneous, TID WC, Cherene Altes, MD   lactated ringers infusion, , Intravenous, Continuous, Cherene Altes, MD, Last Rate: 50 mL/hr at 08/25/21 2300, Infusion Verify at 08/25/21 2300   meropenem (MERREM) 1 g in sodium chloride 0.9 % 100 mL IVPB, 1 g, Intravenous, Q12H, Cherene Altes, MD, Stopped at 08/25/21 1724   metoprolol tartrate (LOPRESSOR) tablet 12.5 mg, 12.5 mg, Oral, BID, Joette Catching T, MD, 12.5 mg at 08/25/21 2138   ondansetron (ZOFRAN) tablet 4 mg, 4 mg, Oral, Q6H PRN **OR** ondansetron (ZOFRAN) injection 4 mg, 4 mg, Intravenous, Q6H PRN, Cherene Altes, MD, 4 mg at 08/25/21 0845   pantoprazole (PROTONIX) EC tablet 40 mg, 40 mg, Oral, BID, Cherene Altes, MD, 40 mg at 08/25/21 2138   polyethylene glycol (MIRALAX / GLYCOLAX) packet 17 g, 17 g, Oral, Daily PRN, Cherene Altes, MD   saccharomyces boulardii (FLORASTOR) capsule  250 mg, 250 mg, Oral, BID, Cherene Altes, MD, 250 mg at 08/25/21 2138   senna (SENOKOT) tablet 8.6 mg, 1 tablet, Oral, BID, Cherene Altes, MD, 8.6 mg at 08/25/21 1037   traMADol (ULTRAM) tablet 50 mg, 50 mg, Oral, Q12H PRN, Cherene Altes, MD   Allergies  Allergen Reactions   Amoxicillin Diarrhea    Has patient had a PCN reaction causing immediate rash, facial/tongue/throat swelling, SOB or lightheadedness with hypotension: no Has patient had a PCN reaction causing severe rash involving mucus membranes or skin necrosis: no Has patient had a PCN reaction that required hospitalization: no pharmacist  consult Has patient had a PCN reaction occurring within the last 10 years: yes If all of the above answers are "NO", then may proceed with Cephalosporin use.    Cefepime Other (See Comments)    Possible CNS toxicity 05/2021   Ampicillin Rash    - Tolerates Rocephin and Ancef - Remote occurrence; no symptoms of anaphylaxis or severe cutaneous reaction, and no additional medical attention required     Sulfa Antibiotics Rash     Review of Systems  Constitutional: Negative.   HENT:  Positive for ear pain.   Respiratory: Negative.    Cardiovascular: Negative.   Gastrointestinal: Negative.   Genitourinary:  Negative for dysuria.  Neurological: Negative.   Psychiatric/Behavioral: Negative.      Today's Vitals   08/20/21 1508  BP: 116/72  Pulse: (!) 105  Temp: 98.5 F (36.9 C)  Height: 5' 5.5" (1.664 m)  PainSc: 0-No pain   Body mass index is 20.88 kg/m.  Wt Readings from Last 3 Encounters:  08/25/21 119 lb 4.3 oz (54.1 kg)  08/16/21 127 lb 6.4 oz (57.8 kg)  07/13/21 122 lb 12.7 oz (55.7 kg)    BP Readings from Last 3 Encounters:  08/25/21 132/71  08/20/21 116/72  08/16/21 (!) 142/71    Objective:  Physical Exam Vitals and nursing note reviewed.  Constitutional:      Appearance: Normal appearance. She is ill-appearing.  HENT:     Head: Normocephalic and atraumatic.     Right Ear: Tympanic membrane, ear canal and external ear normal. There is no impacted cerumen.     Left Ear: Tympanic membrane, ear canal and external ear normal. There is no impacted cerumen.     Nose:     Comments: Masked     Mouth/Throat:     Comments: Masked  Eyes:     Extraocular Movements: Extraocular movements intact.  Cardiovascular:     Rate and Rhythm: Normal rate and regular rhythm.     Heart sounds: Normal heart sounds.  Pulmonary:     Effort: Pulmonary effort is normal.     Breath sounds: Normal breath sounds.  Skin:    General: Skin is warm.  Neurological:     Mental Status:  She is alert.  Psychiatric:        Mood and Affect: Mood normal.        Behavior: Behavior normal.        Assessment And Plan:     1. Urinary frequency Comments: I will check u/a and send for culture to get sensitivities due to med allergies. Treatments options are limited due to pt allergies/renal function. Pt is encouraged to stay well hydrated.   I spoke with family in detail regarding limited treatment options. I would like to treat w/ doxy; however I prefer to wait until sensitivity report is available to review. Augmentin is  another possibility; however this will likely exacerbate her pre-existing diarrhea. - POCT Urinalysis Dipstick (81002) - Urine Culture - cefTRIAXone (ROCEPHIN) injection 500 mg  2. Right ear pain Comments: PT also has cough, rapid test is negative. Will send off PCR test. Of note, she has been around a family member that hasn't been feeling well for 2 days or so.  - Novel Coronavirus, NAA ([facility]) - POC COVID-19  3. Type 2 diabetes mellitus with stage 3a chronic kidney disease, without long-term current use of insulin ([name]) Comments: Chronic, I iwll check urine microalbumin today.  - Microalbumin / Creatinine Urine Ratio   Patient was given opportunity to ask questions. Patient verbalized understanding of the plan and was able to repeat key elements of the plan. All questions were answered to their satisfaction.   I personally spent 30 minutes face-to-face and non-face-to-face in the care of this patient, which includes all pre-, intra-, and post visit time on the date of service.  I, Maximino Greenland, MD, have reviewed all documentation for this visit. The documentation on 08/20/21 for the exam, diagnosis, procedures, and orders are all accurate and complete.   IF YOU HAVE BEEN REFERRED TO A SPECIALIST, IT MAY TAKE 1-2 WEEKS TO SCHEDULE/PROCESS THE REFERRAL. IF YOU HAVE NOT HEARD FROM US/SPECIALIST IN TWO WEEKS, PLEASE GIVE [place] A CALL AT [phone] X [phone].    THE PATIENT IS ENCOURAGED TO PRACTICE SOCIAL DISTANCING DUE TO THE COVID-19 PANDEMIC.

## 2021-08-21 ENCOUNTER — Other Ambulatory Visit (HOSPITAL_COMMUNITY): Payer: Self-pay

## 2021-08-21 LAB — SARS-COV-2, NAA 2 DAY TAT

## 2021-08-21 LAB — MICROALBUMIN / CREATININE URINE RATIO
Creatinine, Urine: 85.3 mg/dL
Microalb/Creat Ratio: 40 mg/g creat — ABNORMAL HIGH (ref 0–29)
Microalbumin, Urine: 34.4 ug/mL

## 2021-08-21 LAB — NOVEL CORONAVIRUS, NAA: SARS-CoV-2, NAA: NOT DETECTED

## 2021-08-22 ENCOUNTER — Other Ambulatory Visit: Payer: Self-pay | Admitting: Internal Medicine

## 2021-08-22 DIAGNOSIS — I10 Essential (primary) hypertension: Secondary | ICD-10-CM | POA: Diagnosis not present

## 2021-08-22 DIAGNOSIS — N179 Acute kidney failure, unspecified: Secondary | ICD-10-CM | POA: Diagnosis not present

## 2021-08-22 DIAGNOSIS — H9190 Unspecified hearing loss, unspecified ear: Secondary | ICD-10-CM | POA: Diagnosis not present

## 2021-08-22 DIAGNOSIS — I700 Atherosclerosis of aorta: Secondary | ICD-10-CM | POA: Diagnosis not present

## 2021-08-22 DIAGNOSIS — G9341 Metabolic encephalopathy: Secondary | ICD-10-CM | POA: Diagnosis not present

## 2021-08-22 DIAGNOSIS — E1122 Type 2 diabetes mellitus with diabetic chronic kidney disease: Secondary | ICD-10-CM | POA: Diagnosis not present

## 2021-08-22 DIAGNOSIS — M1990 Unspecified osteoarthritis, unspecified site: Secondary | ICD-10-CM | POA: Diagnosis not present

## 2021-08-22 DIAGNOSIS — N390 Urinary tract infection, site not specified: Secondary | ICD-10-CM | POA: Diagnosis not present

## 2021-08-22 DIAGNOSIS — E876 Hypokalemia: Secondary | ICD-10-CM | POA: Diagnosis not present

## 2021-08-22 DIAGNOSIS — Z8673 Personal history of transient ischemic attack (TIA), and cerebral infarction without residual deficits: Secondary | ICD-10-CM | POA: Diagnosis not present

## 2021-08-22 DIAGNOSIS — I421 Obstructive hypertrophic cardiomyopathy: Secondary | ICD-10-CM | POA: Diagnosis not present

## 2021-08-22 DIAGNOSIS — Z7982 Long term (current) use of aspirin: Secondary | ICD-10-CM | POA: Diagnosis not present

## 2021-08-22 DIAGNOSIS — E872 Acidosis, unspecified: Secondary | ICD-10-CM | POA: Diagnosis not present

## 2021-08-22 DIAGNOSIS — J301 Allergic rhinitis due to pollen: Secondary | ICD-10-CM | POA: Diagnosis not present

## 2021-08-22 DIAGNOSIS — E041 Nontoxic single thyroid nodule: Secondary | ICD-10-CM | POA: Diagnosis not present

## 2021-08-22 DIAGNOSIS — N1831 Chronic kidney disease, stage 3a: Secondary | ICD-10-CM | POA: Diagnosis not present

## 2021-08-22 DIAGNOSIS — I77810 Thoracic aortic ectasia: Secondary | ICD-10-CM | POA: Diagnosis not present

## 2021-08-22 DIAGNOSIS — E440 Moderate protein-calorie malnutrition: Secondary | ICD-10-CM | POA: Diagnosis not present

## 2021-08-22 DIAGNOSIS — Z9181 History of falling: Secondary | ICD-10-CM | POA: Diagnosis not present

## 2021-08-22 DIAGNOSIS — E1140 Type 2 diabetes mellitus with diabetic neuropathy, unspecified: Secondary | ICD-10-CM | POA: Diagnosis not present

## 2021-08-22 DIAGNOSIS — E785 Hyperlipidemia, unspecified: Secondary | ICD-10-CM | POA: Diagnosis not present

## 2021-08-22 DIAGNOSIS — Z794 Long term (current) use of insulin: Secondary | ICD-10-CM | POA: Diagnosis not present

## 2021-08-22 LAB — URINE CULTURE

## 2021-08-22 MED ORDER — AMOXICILLIN-POT CLAVULANATE 500-125 MG PO TABS: 1.0000 | ORAL_TABLET | Freq: Two times a day (BID) | ORAL | 0 refills | Status: DC

## 2021-08-22 MED ORDER — SACCHAROMYCES BOULARDII 250 MG PO CAPS: 250.0000 mg | ORAL_CAPSULE | Freq: Two times a day (BID) | ORAL | 0 refills | Status: AC

## 2021-08-23 ENCOUNTER — Other Ambulatory Visit (HOSPITAL_COMMUNITY): Payer: Self-pay

## 2021-08-23 ENCOUNTER — Other Ambulatory Visit: Payer: Self-pay

## 2021-08-24 ENCOUNTER — Inpatient Hospital Stay (HOSPITAL_COMMUNITY)
Admission: EM | Admit: 2021-08-24 | Discharge: 2021-08-28 | DRG: 689 | Disposition: A | Discharge status: Home Health | Payer: Medicare Other | Attending: Internal Medicine | Admitting: Internal Medicine

## 2021-08-24 ENCOUNTER — Encounter (HOSPITAL_COMMUNITY): Payer: Self-pay

## 2021-08-24 ENCOUNTER — Emergency Department (HOSPITAL_COMMUNITY): Payer: Medicare Other

## 2021-08-24 ENCOUNTER — Other Ambulatory Visit: Payer: Self-pay

## 2021-08-24 DIAGNOSIS — R7989 Other specified abnormal findings of blood chemistry: Secondary | ICD-10-CM | POA: Diagnosis not present

## 2021-08-24 DIAGNOSIS — I129 Hypertensive chronic kidney disease with stage 1 through stage 4 chronic kidney disease, or unspecified chronic kidney disease: Secondary | ICD-10-CM | POA: Diagnosis present

## 2021-08-24 DIAGNOSIS — Z681 Body mass index (BMI) 19 or less, adult: Secondary | ICD-10-CM

## 2021-08-24 DIAGNOSIS — Z8673 Personal history of transient ischemic attack (TIA), and cerebral infarction without residual deficits: Secondary | ICD-10-CM | POA: Diagnosis not present

## 2021-08-24 DIAGNOSIS — N179 Acute kidney failure, unspecified: Secondary | ICD-10-CM | POA: Diagnosis not present

## 2021-08-24 DIAGNOSIS — E1122 Type 2 diabetes mellitus with diabetic chronic kidney disease: Secondary | ICD-10-CM | POA: Diagnosis not present

## 2021-08-24 DIAGNOSIS — G9341 Metabolic encephalopathy: Secondary | ICD-10-CM | POA: Diagnosis not present

## 2021-08-24 DIAGNOSIS — E43 Unspecified severe protein-calorie malnutrition: Secondary | ICD-10-CM | POA: Diagnosis not present

## 2021-08-24 DIAGNOSIS — I422 Other hypertrophic cardiomyopathy: Secondary | ICD-10-CM | POA: Diagnosis not present

## 2021-08-24 DIAGNOSIS — N3000 Acute cystitis without hematuria: Secondary | ICD-10-CM

## 2021-08-24 DIAGNOSIS — Z880 Allergy status to penicillin: Secondary | ICD-10-CM | POA: Diagnosis not present

## 2021-08-24 DIAGNOSIS — Z882 Allergy status to sulfonamides status: Secondary | ICD-10-CM

## 2021-08-24 DIAGNOSIS — N390 Urinary tract infection, site not specified: Principal | ICD-10-CM | POA: Diagnosis present

## 2021-08-24 DIAGNOSIS — A499 Bacterial infection, unspecified: Secondary | ICD-10-CM | POA: Diagnosis not present

## 2021-08-24 DIAGNOSIS — N1831 Chronic kidney disease, stage 3a: Secondary | ICD-10-CM | POA: Diagnosis present

## 2021-08-24 DIAGNOSIS — Z888 Allergy status to other drugs, medicaments and biological substances status: Secondary | ICD-10-CM | POA: Diagnosis not present

## 2021-08-24 DIAGNOSIS — E861 Hypovolemia: Secondary | ICD-10-CM | POA: Diagnosis present

## 2021-08-24 DIAGNOSIS — E871 Hypo-osmolality and hyponatremia: Secondary | ICD-10-CM | POA: Diagnosis not present

## 2021-08-24 DIAGNOSIS — Z7982 Long term (current) use of aspirin: Secondary | ICD-10-CM | POA: Diagnosis not present

## 2021-08-24 DIAGNOSIS — Z20822 Contact with and (suspected) exposure to covid-19: Secondary | ICD-10-CM | POA: Diagnosis present

## 2021-08-24 DIAGNOSIS — B9620 Unspecified Escherichia coli [E. coli] as the cause of diseases classified elsewhere: Secondary | ICD-10-CM | POA: Diagnosis present

## 2021-08-24 DIAGNOSIS — R059 Cough, unspecified: Secondary | ICD-10-CM | POA: Diagnosis not present

## 2021-08-24 DIAGNOSIS — Z1612 Extended spectrum beta lactamase (ESBL) resistance: Secondary | ICD-10-CM | POA: Diagnosis present

## 2021-08-24 DIAGNOSIS — D649 Anemia, unspecified: Secondary | ICD-10-CM | POA: Diagnosis not present

## 2021-08-24 DIAGNOSIS — Z950 Presence of cardiac pacemaker: Secondary | ICD-10-CM

## 2021-08-24 DIAGNOSIS — R112 Nausea with vomiting, unspecified: Secondary | ICD-10-CM | POA: Diagnosis not present

## 2021-08-24 DIAGNOSIS — B9629 Other Escherichia coli [E. coli] as the cause of diseases classified elsewhere: Secondary | ICD-10-CM | POA: Diagnosis present

## 2021-08-24 DIAGNOSIS — I441 Atrioventricular block, second degree: Secondary | ICD-10-CM | POA: Diagnosis present

## 2021-08-24 DIAGNOSIS — R531 Weakness: Secondary | ICD-10-CM

## 2021-08-24 DIAGNOSIS — Z820 Family history of epilepsy and other diseases of the nervous system: Secondary | ICD-10-CM | POA: Diagnosis not present

## 2021-08-24 DIAGNOSIS — Z79899 Other long term (current) drug therapy: Secondary | ICD-10-CM

## 2021-08-24 DIAGNOSIS — Z794 Long term (current) use of insulin: Secondary | ICD-10-CM

## 2021-08-24 LAB — COMPREHENSIVE METABOLIC PANEL
ALT: 15 U/L (ref 0–44)
AST: 26 U/L (ref 15–41)
Albumin: 4.3 g/dL (ref 3.5–5.0)
Alkaline Phosphatase: 62 U/L (ref 38–126)
Anion gap: 15 (ref 5–15)
BUN: 29 mg/dL — ABNORMAL HIGH (ref 8–23)
CO2: 14 mmol/L — ABNORMAL LOW (ref 22–32)
Calcium: 9.6 mg/dL (ref 8.9–10.3)
Chloride: 105 mmol/L (ref 98–111)
Creatinine, Ser: 1.82 mg/dL — ABNORMAL HIGH (ref 0.44–1.00)
GFR, Estimated: 27 mL/min — ABNORMAL LOW (ref >60)
Glucose, Bld: 101 mg/dL — ABNORMAL HIGH (ref 70–99)
Potassium: 4.3 mmol/L (ref 3.5–5.1)
Sodium: 134 mmol/L — ABNORMAL LOW (ref 135–145)
Total Bilirubin: 1 mg/dL (ref 0.3–1.2)
Total Protein: 7.7 g/dL (ref 6.5–8.1)

## 2021-08-24 LAB — CBC WITH DIFFERENTIAL/PLATELET
Abs Immature Granulocytes: 0.03 10*3/uL (ref 0.00–0.07)
Basophils Absolute: 0.1 10*3/uL (ref 0.0–0.1)
Basophils Relative: 1 %
Eosinophils Absolute: 0.1 10*3/uL (ref 0.0–0.5)
Eosinophils Relative: 1 %
HCT: 42.8 % (ref 36.0–46.0)
Hemoglobin: 13.7 g/dL (ref 12.0–15.0)
Immature Granulocytes: 0 %
Lymphocytes Relative: 25 %
Lymphs Abs: 2.5 10*3/uL (ref 0.7–4.0)
MCH: 27.1 pg (ref 26.0–34.0)
MCHC: 32 g/dL (ref 30.0–36.0)
MCV: 84.6 fL (ref 80.0–100.0)
Monocytes Absolute: 0.7 10*3/uL (ref 0.1–1.0)
Monocytes Relative: 7 %
Neutro Abs: 6.4 10*3/uL (ref 1.7–7.7)
Neutrophils Relative %: 66 %
Platelets: 489 10*3/uL — ABNORMAL HIGH (ref 150–400)
RBC: 5.06 MIL/uL (ref 3.87–5.11)
RDW: 15 % (ref 11.5–15.5)
WBC: 9.8 10*3/uL (ref 4.0–10.5)
nRBC: 0 % (ref 0.0–0.2)

## 2021-08-24 LAB — URINALYSIS, ROUTINE W REFLEX MICROSCOPIC
Bacteria, UA: NONE SEEN
Bilirubin Urine: NEGATIVE
Glucose, UA: NEGATIVE mg/dL
Hgb urine dipstick: NEGATIVE
Ketones, ur: 5 mg/dL — AB
Nitrite: NEGATIVE
Protein, ur: 30 mg/dL — AB
Specific Gravity, Urine: 1.015 (ref 1.005–1.030)
pH: 5 (ref 5.0–8.0)

## 2021-08-24 LAB — GLUCOSE, CAPILLARY: Glucose-Capillary: 82 mg/dL (ref 70–99)

## 2021-08-24 LAB — RESP PANEL BY RT-PCR (FLU A&B, COVID) ARPGX2
Influenza A by PCR: NEGATIVE
Influenza B by PCR: NEGATIVE
SARS Coronavirus 2 by RT PCR: NEGATIVE

## 2021-08-24 MED ORDER — LACTATED RINGERS IV BOLUS
1000.0000 mL | Freq: Once | INTRAVENOUS | Status: AC
  Administered 2021-08-24: 1000 mL via INTRAVENOUS

## 2021-08-24 MED ORDER — SACCHAROMYCES BOULARDII 250 MG PO CAPS
250.0000 mg | ORAL_CAPSULE | Freq: Two times a day (BID) | ORAL | Status: DC
  Administered 2021-08-24 – 2021-08-28 (×8): 250 mg via ORAL
  Filled 2021-08-24 (×8): qty 1

## 2021-08-24 MED ORDER — SODIUM CHLORIDE 0.9 % IV SOLN
1.0000 g | Freq: Once | INTRAVENOUS | Status: AC
  Administered 2021-08-24: 1 g via INTRAVENOUS
  Filled 2021-08-24: qty 1

## 2021-08-24 MED ORDER — BISACODYL 10 MG RE SUPP: 10.0000 mg | Freq: Every day | RECTAL | Status: DC | PRN

## 2021-08-24 MED ORDER — POLYETHYLENE GLYCOL 3350 17 G PO PACK
17.0000 g | PACK | Freq: Every day | ORAL | Status: DC | PRN
  Administered 2021-08-26: 17 g via ORAL
  Filled 2021-08-24: qty 1

## 2021-08-24 MED ORDER — HYDRALAZINE HCL 50 MG PO TABS
100.0000 mg | ORAL_TABLET | Freq: Three times a day (TID) | ORAL | Status: DC
  Administered 2021-08-24 – 2021-08-28 (×11): 100 mg via ORAL
  Filled 2021-08-24 (×11): qty 2

## 2021-08-24 MED ORDER — PROCHLORPERAZINE EDISYLATE 10 MG/2ML IJ SOLN: 10.0000 mg | Freq: Once | INTRAMUSCULAR | Status: DC

## 2021-08-24 MED ORDER — ENOXAPARIN SODIUM 30 MG/0.3ML IJ SOSY
30.0000 mg | PREFILLED_SYRINGE | INTRAMUSCULAR | Status: DC
  Administered 2021-08-24 – 2021-08-27 (×4): 30 mg via SUBCUTANEOUS
  Filled 2021-08-24 (×4): qty 0.3

## 2021-08-24 MED ORDER — ONDANSETRON HCL 4 MG PO TABS: 4.0000 mg | ORAL_TABLET | Freq: Four times a day (QID) | ORAL | Status: DC | PRN

## 2021-08-24 MED ORDER — SENNA 8.6 MG PO TABS
1.0000 | ORAL_TABLET | Freq: Two times a day (BID) | ORAL | Status: DC
  Administered 2021-08-25 – 2021-08-28 (×4): 8.6 mg via ORAL
  Filled 2021-08-24 (×5): qty 1

## 2021-08-24 MED ORDER — ASPIRIN EC 81 MG PO TBEC
81.0000 mg | DELAYED_RELEASE_TABLET | Freq: Every day | ORAL | Status: DC
  Administered 2021-08-25 – 2021-08-28 (×4): 81 mg via ORAL
  Filled 2021-08-24 (×4): qty 1

## 2021-08-24 MED ORDER — METOPROLOL TARTRATE 25 MG PO TABS
12.5000 mg | ORAL_TABLET | Freq: Two times a day (BID) | ORAL | Status: DC
  Administered 2021-08-24 – 2021-08-28 (×8): 12.5 mg via ORAL
  Filled 2021-08-24 (×8): qty 1

## 2021-08-24 MED ORDER — SODIUM CHLORIDE 0.9 % IV SOLN
1.0000 g | Freq: Two times a day (BID) | INTRAVENOUS | Status: DC
  Administered 2021-08-25 – 2021-08-28 (×7): 1 g via INTRAVENOUS
  Filled 2021-08-24 (×9): qty 1

## 2021-08-24 MED ORDER — ACETAMINOPHEN 325 MG PO TABS
650.0000 mg | ORAL_TABLET | Freq: Four times a day (QID) | ORAL | Status: DC | PRN
  Administered 2021-08-25 – 2021-08-26 (×3): 650 mg via ORAL
  Filled 2021-08-24 (×3): qty 2

## 2021-08-24 MED ORDER — ONDANSETRON HCL 4 MG/2ML IJ SOLN
4.0000 mg | Freq: Four times a day (QID) | INTRAMUSCULAR | Status: DC | PRN
  Administered 2021-08-24 – 2021-08-25 (×2): 4 mg via INTRAVENOUS
  Filled 2021-08-24 (×2): qty 2

## 2021-08-24 MED ORDER — TRAMADOL HCL 50 MG PO TABS
50.0000 mg | ORAL_TABLET | Freq: Two times a day (BID) | ORAL | Status: DC | PRN
  Administered 2021-08-27 (×2): 50 mg via ORAL
  Filled 2021-08-24 (×2): qty 1

## 2021-08-24 MED ORDER — PANTOPRAZOLE SODIUM 40 MG PO TBEC
40.0000 mg | DELAYED_RELEASE_TABLET | Freq: Two times a day (BID) | ORAL | Status: DC
  Administered 2021-08-24 – 2021-08-28 (×8): 40 mg via ORAL
  Filled 2021-08-24 (×8): qty 1

## 2021-08-24 MED ORDER — LACTATED RINGERS IV SOLN: INTRAVENOUS | Status: DC

## 2021-08-24 NOTE — ED Notes (Signed)
Pt to CT at this time.

## 2021-08-24 NOTE — H&P (Signed)
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  She was admitted to the hospital [id number] with metabolic encephalopathy related to an ESBL UTI treated with fosfomycin.  This was following an admission in November 2022 for an ischemic CVA.    The patient was seen in her primary care physician's office 1/17 with symptoms of a UTI to include dysuria and progressive confusion.  She was dosed with ceftriaxone during the visit and sent home with Augmentin for empiric treatment.  Cultures obtained during that visit ultimately revealed ESBL E. coli.  Her family brought her to the [facility] ED because her confusion and lethargy were worsening, and she had developed nausea with vomiting related to the antibiotic she was prescribed as an outpatient.  She continued to have urinary frequency as well.  CT head in the ER was unrevealing.  Assessment/Plan  ESBL E coli UTI Continue directed abx tx and monitor clinically   AKI on CKD stage IIIa Baseline creatinine reportedly approximately 1.3 -creatinine 1.82 at presentation -clinically most consistent with prerenal azotemia -hydrate and follow  Sinus tachycardia Likely due to a combination of DH as well as inability to keep BB down - hydrate - resume home BB - monitor on tele until resolved   Mild hyponatremia Clinically most consistent with hypovolemia - follow with volume expansion  Uncontrolled HTN Likely due to difficutly keeping home meds down in setting of vomiting - resume home meds and follow   Hypertrophic cardiomyopathy Appears well compensated presently   2-1 AV  block with paroxysmal syncope Monitor on tele - is being worked up for pacer by EP as outpt   DM2 Monitor CBG - hold scheduled meds until clear she can tolerate oral intake   DVT prophylaxis: SCDs Code Status: FULL Family Communication: no family present at time of exam  Disposition Plan:  Admit to Inpatient  Consults called: none indicated  Review of Systems: As per HPI otherwise 10 point review of systems negative.   Past Medical History:  Diagnosis Date   Arthritis    Diabetes mellitus without complication ([place])    Hypertension    Hypokalemia    Pneumonia    Shingles    Stroke [facility])     Past Surgical History:  Procedure Laterality Date   ABDOMINAL HYSTERECTOMY     BREAST EXCISIONAL BIOPSY Left    ESOPHAGOGASTRODUODENOSCOPY N/A 09/01/2014   Procedure: ESOPHAGOGASTRODUODENOSCOPY (EGD);  Surgeon: Lear Ng, MD;  Location: [facility];  Service: Endoscopy;  Laterality: N/A;   ESOPHAGOGASTRODUODENOSCOPY (EGD) WITH PROPOFOL N/A 10/12/2020   Procedure: ESOPHAGOGASTRODUODENOSCOPY (EGD) WITH PROPOFOL;  Surgeon: Mauri Pole, MD;  Location: [facility];  Service: Endoscopy;  Laterality: N/A;   HIATAL HERNIA REPAIR N/A 09/04/2014   Procedure: LAPAROSCOPIC REPAIR OF HIATAL HERNIA;  Surgeon: Excell Seltzer, MD;  Location: [facility] ORS;  Service: General;  Laterality: N/A;  With MESH   IR GENERIC HISTORICAL  04/10/2016   IR US GUIDE VASC ACCESS RIGHT 04/10/2016 Corrie Mckusick, DO [facility]   IR GENERIC HISTORICAL  04/10/2016   IR ANGIOGRAM SELECTIVE EACH ADDITIONAL VESSEL 04/10/2016 Corrie Mckusick, DO [facility]   IR GENERIC HISTORICAL  04/10/2016   IR ANGIOGRAM SELECTIVE EACH ADDITIONAL VESSEL 04/10/2016 Corrie Mckusick, DO [facility]   IR GENERIC HISTORICAL  04/10/2016   IR EMBO TUMOR ORGAN ISCHEMIA INFARCT INC GUIDE ROADMAPPING 04/10/2016 Corrie Mckusick, DO [facility]   IR GENERIC HISTORICAL  04/10/2016   IR ANGIOGRAM SELECTIVE EACH ADDITIONAL VESSEL 04/10/2016 Corrie Mckusick, DO  [facility]   IR GENERIC HISTORICAL  04/10/2016   IR ANGIOGRAM SELECTIVE EACH ADDITIONAL VESSEL 04/10/2016 Corrie Mckusick, DO [facility]   IR GENERIC HISTORICAL  04/10/2016   IR RENAL SELECTIVE  UNI INC S&I MOD SED 04/10/2016 Corrie Mckusick, DO [facility]   IR GENERIC HISTORICAL  03/06/2016   IR RADIOLOGIST EVAL & MGMT 03/06/2016 Corrie Mckusick, DO [facility]   IR GENERIC HISTORICAL  03/26/2016   IR RADIOLOGIST EVAL & MGMT 03/26/2016 Corrie Mckusick, DO [facility]   IR GENERIC HISTORICAL  04/29/2016   IR RADIOLOGIST EVAL & MGMT 04/29/2016 [facility]   IR RADIOLOGIST EVAL & MGMT  11/12/2016   IR RADIOLOGIST EVAL & MGMT  12/16/2017   KNEE SURGERY     TONSILLECTOMY      Family History  Family History  Problem Relation Age of Onset   Alzheimer's disease Mother    Prostate cancer Father    Brain cancer Brother    Brain cancer Brother    Pancreatic cancer Sister    Allergic rhinitis Neg Hx    Asthma Neg Hx    Eczema Neg Hx    Urticaria Neg Hx     Social History   reports that she has never smoked. She has never used smokeless tobacco. She reports that she does not drink alcohol and does not use drugs. Lives in [place] in her son's home.   Allergies Allergies  Allergen Reactions   Amoxicillin Diarrhea    Has patient had a PCN reaction causing immediate rash, facial/tongue/throat swelling, SOB or lightheadedness with hypotension: no Has patient had a PCN reaction causing severe rash involving mucus membranes or skin necrosis: no Has patient had a PCN reaction that required hospitalization: no pharmacist consult Has patient had a PCN reaction occurring within the last 10 years: yes If all of the above answers are "NO", then may proceed with Cephalosporin use.    Cefepime     Possible CNS toxicity 05/2021   Ampicillin Rash    - Tolerates Rocephin and Ancef - Remote occurrence; no symptoms of anaphylaxis or severe cutaneous reaction, and no additional medical attention required      Sulfa Antibiotics Rash    Prior to Admission medications   Medication Sig Start Date End Date Taking? Authorizing Provider  acetaminophen (TYLENOL) 325 MG tablet Take 2 tablets (650 mg total) by mouth every 6 (six) hours as needed for mild pain (or Fever >/= 101). 02/08/16   Johnson, Clanford L, MD  amLODipine (NORVASC) 10 MG tablet Take 1 tablet (10 mg total) by mouth daily. 06/26/21     amoxicillin-clavulanate (AUGMENTIN) 500-125 MG tablet Take 1 tablet (500 mg total) by mouth every 12 (twelve) hours. 08/22/21   Glendale Chard, MD  aspirin 81 MG EC tablet Take 1 tablet (81 mg total) by mouth daily. Swallow whole. 07/15/21   Damita Lack, MD  diclofenac Sodium (VOLTAREN) 1 % GEL Apply 2 g topically every 12 (twelve) hours to left ankle and right shoulder pain Patient not taking: Reported on 08/20/2021 06/26/21     fexofenadine (ALLEGRA ALLERGY) 180 MG tablet Take 1 tablet (180  mg total) by mouth daily. 07/18/21   Glendale Chard, MD  glucose blood test strip Use as directed to check blood sugars 1 time per day. 03/01/21   Glendale Chard, MD  glucose monitoring kit (FREESTYLE) monitoring kit Use as directed to check blood sugars 1 time per day dx: e11.22 03/01/21   Glendale Chard, MD  hydrALAZINE (APRESOLINE) 100 MG tablet Take 1 tablet (100 mg total) by mouth every 8 (eight) hours. 06/26/21     insulin aspart (NOVOLOG) 100 UNIT/ML FlexPen Inject into the skin with meals per sliding scale. 151-200= 2 units, 201-250= 4 units, 251-300= 6 units, 301-350=8 units, 351-400=10 units, >401=10 units and notify MD Patient taking differently: Inject 2-10 Units into the skin in the morning and at bedtime. 06/26/21     Lancets (ONETOUCH DELICA PLUS [id number]) MISC Use to check blood sugar once daily. 02/07/21     metoprolol tartrate (LOPRESSOR) 25 MG tablet Take 0.5 tablets (12.5 mg total) by mouth 2 (two) times daily. 07/18/21   Glendale Chard, MD  pantoprazole (PROTONIX) 40 MG tablet Take 1 tablet (40 mg  total) by mouth 2 (two) times daily. 06/26/21     prochlorperazine (COMPAZINE) 5 MG tablet Take 1 tablet (5 mg total) by mouth every 6 (six) hours as needed for nausea or vomiting. Patient not taking: Reported on 08/20/2021 05/22/21   Minette Brine, FNP  rosuvastatin (CRESTOR) 20 MG tablet Take 1 tablet (20 mg total) by mouth daily. 07/18/21   Glendale Chard, MD  saccharomyces boulardii (FLORASTOR) 250 MG capsule Take 1 capsule (250 mg total) by mouth 2 (two) times daily. 08/22/21   Glendale Chard, MD  sucralfate (CARAFATE) 1 GM/10ML suspension Take 10 mLs (1 g total) by mouth 4 (four) times daily -  with meals and at bedtime for 28 days 07/10/21   Glendale Chard, MD    Physical Exam: Vitals:   08/24/21 1430 08/24/21 1432  BP: (!) 162/88   Pulse: (!) 107 (!) 109  Resp:  16  Temp:  (!) 97.5 F (36.4 C)  TempSrc:  Oral  SpO2: 96%     Constitutional: NAD, calm, comfortable Eyes: PERRL, lids and conjunctivae normal ENMT: Mucous membranes are dry.  Neck: normal, supple Respiratory: clear to auscultation bilaterally, no wheezing, no crackles. Normal respiratory effort. No accessory muscle use.  Cardiovascular: tachycardic, regular, no M or rub Abdomen: No tenderness or masses to palpation. No hepatosplenomegaly. Bowel sounds positive. Not distended. Soft.  Musculoskeletal: No clubbing / cyanosis. No joint deformity upper and lower extremities. No contractures. Normal muscle tone.  Neurologic: CN 2-12 grossly intact B. Sensation intact. Strength 4+/5 in all 4 extremities.  Psychiatric: Normal judgment and insight. Alert and oriented x 3. Normal mood.    Labs on Admission:   CBC: Recent Labs  Lab 08/24/21 1432  WBC 9.8  NEUTROABS 6.4  HGB 13.7  HCT 42.8  MCV 84.6  PLT 096*   Basic Metabolic Panel: Recent Labs  Lab 08/24/21 1432  NA 134*  K 4.3  CL 105  CO2 14*  GLUCOSE 101*  BUN 29*  CREATININE 1.82*  CALCIUM 9.6   GFR: Estimated Creatinine Clearance: 21 mL/min (A) (by  C-G formula based on SCr of 1.82 mg/dL (H)).  Liver Function Tests: Recent Labs  Lab 08/24/21 1432  AST 26  ALT 15  ALKPHOS 62  BILITOT 1.0  PROT 7.7  ALBUMIN 4.3    Urine analysis:    Component Value Date/Time   COLORURINE YELLOW 08/24/2021 1442  APPEARANCEUR CLEAR 08/24/2021 1442   LABSPEC 1.015 08/24/2021 1442   PHURINE 5.0 08/24/2021 1442   GLUCOSEU NEGATIVE 08/24/2021 1442   HGBUR NEGATIVE 08/24/2021 1442   BILIRUBINUR NEGATIVE 08/24/2021 1442   BILIRUBINUR negative 08/20/2021 1650   KETONESUR 5 (A) 08/24/2021 1442   PROTEINUR 30 (A) 08/24/2021 1442   UROBILINOGEN 0.2 08/20/2021 1650   UROBILINOGEN 0.2 09/01/2014 0300   NITRITE NEGATIVE 08/24/2021 1442   LEUKOCYTESUR SMALL (A) 08/24/2021 1442     Radiological Exams on Admission: CT Head Wo Contrast  Result Date: 08/24/2021 CLINICAL DATA:  Mental status change EXAM: CT HEAD WITHOUT CONTRAST TECHNIQUE: Contiguous axial images were obtained from the base of the skull through the vertex without intravenous contrast. RADIATION DOSE REDUCTION: This exam was performed according to the departmental dose-optimization program which includes automated exposure control, adjustment of the mA and/or kV according to patient size and/or use of iterative reconstruction technique. COMPARISON:  MRI 07/12/2021, CT brain 07/11/2021 FINDINGS: Brain: No acute territorial infarction, hemorrhage, or intracranial mass. Moderate atrophy. Fairly extensive white matter hypodensity consistent with chronic small vessel ischemic change. Small chronic infarct in the left cerebellum. Stable ventricle size. Vascular: No hyperdense vessels.  Carotid vascular calcification Skull: Normal. Negative for fracture or focal lesion. Sinuses/Orbits: No acute finding. Other: None IMPRESSION: 1. No CT evidence for acute intracranial abnormality. 2. Atrophy and chronic small vessel ischemic changes of the white matter. Electronically Signed   By: Donavan Foil M.D.   On:  08/24/2021 15:36   DG Chest Port 1 View  Result Date: 08/24/2021 CLINICAL DATA:  Cough. EXAM: PORTABLE CHEST 1 VIEW COMPARISON:  07/12/2021 FINDINGS: The lungs are clear without focal pneumonia, edema, pneumothorax or pleural effusion. The cardiopericardial silhouette is within normal limits for size. The visualized bony structures of the thorax show no acute abnormality. Telemetry leads overlie the chest. IMPRESSION: No active disease. Electronically Signed   By: Misty Stanley M.D.   On: 08/24/2021 16:17     Cherene Altes, MD [facility] Office  [phone] Pager - Text Page per Amion as per below:  On-Call/Text Page:      [email or web address]  If 7PM-7AM, please contact night-coverage [email or web address] 08/24/2021, 4:32 PM

## 2021-08-24 NOTE — Progress Notes (Signed)
A consult was received from an ED physician for meropenem per pharmacy dosing.  The patient's profile has been reviewed for ht/wt/allergies/indication/available labs.   A one time order has been placed for meropenem 1g.  Further antibiotics/pharmacy consults should be ordered by admitting physician if indicated.                       Thank you, Peggyann Juba, PharmD, BCPS 08/24/2021  4:13 PM

## 2021-08-24 NOTE — ED Triage Notes (Signed)
Pts daughter reports last weekend pt started becoming weak and confused. Pt went to PCP on Tuesday and was diagnosed with a UTI and given antibiotics. Pts daughter states the antibiotics caused her to start vomiting and becoming weaker. Pts daughter reports even before antibiotics pt had decreased appetite.

## 2021-08-24 NOTE — ED Provider Notes (Signed)
[facility] EMERGENCY DEPARTMENT Provider Note    CSN: [id number] Arrival date & time: 08/24/21 1415  History Chief Complaint  Patient presents with   Weakness    Anna Hoffman is a 87 y.o. female with history of ESBL brought to the ED by daughter who supplements the history. Patient was acting confused earlier this week and having some urinary frequency which is consistent with prior cystitis. She saw PCP on Tuesday who did a UA and culture which was again positive for ESBL e. Coli. She was started on Augmentin per sensisitvies, but patient reports she has been having increased vomiting, poor PO intake and increased weakness. Daughter states she was previously able to get up out of bed to a bedside commode but now is having trouble even sitting up to eat. She had a fall on Monday and required help getting up. No known injury but she has a history of stroke/ICH.   Home Medications Prior to Admission medications   Medication Sig Start Date End Date Taking? Authorizing Provider  acetaminophen (TYLENOL) 325 MG tablet Take 2 tablets (650 mg total) by mouth every 6 (six) hours as needed for mild pain (or Fever >/= 101). 02/08/16   Johnson, Clanford L, MD  amLODipine (NORVASC) 10 MG tablet Take 1 tablet (10 mg total) by mouth daily. 06/26/21     amoxicillin-clavulanate (AUGMENTIN) 500-125 MG tablet Take 1 tablet (500 mg total) by mouth every 12 (twelve) hours. 08/22/21   Glendale Chard, MD  aspirin 81 MG EC tablet Take 1 tablet (81 mg total) by mouth daily. Swallow whole. 07/15/21   Damita Lack, MD  diclofenac Sodium (VOLTAREN) 1 % GEL Apply 2 g topically every 12 (twelve) hours to left ankle and right shoulder pain Patient not taking: Reported on 08/20/2021 06/26/21     fexofenadine (ALLEGRA ALLERGY) 180 MG tablet Take 1 tablet (180 mg total) by mouth daily. 07/18/21   Glendale Chard, MD  glucose blood test strip Use as directed to check blood sugars 1 time per day. 03/01/21   Glendale Chard, MD  glucose monitoring kit (FREESTYLE) monitoring kit Use as directed to check blood sugars 1 time per day dx: e11.22 03/01/21   Glendale Chard, MD  hydrALAZINE (APRESOLINE) 100 MG tablet Take 1 tablet (100 mg total) by mouth every 8 (eight) hours. 06/26/21     insulin aspart (NOVOLOG) 100 UNIT/ML FlexPen Inject into the skin with meals per sliding scale. 151-200= 2 units, 201-250= 4 units, 251-300= 6 units, 301-350=8 units, 351-400=10 units, >401=10 units and notify MD Patient taking differently: Inject 2-10 Units into the skin in the morning and at bedtime. 06/26/21     Lancets (ONETOUCH DELICA PLUS ENIDPO24M) MISC Use to check blood sugar once daily. 02/07/21     metoprolol tartrate (LOPRESSOR) 25 MG tablet Take 0.5 tablets (12.5 mg total) by mouth 2 (two) times daily. 07/18/21   Glendale Chard, MD  pantoprazole (PROTONIX) 40 MG tablet Take 1 tablet (40 mg total) by mouth 2 (two) times daily. 06/26/21     prochlorperazine (COMPAZINE) 5 MG tablet Take 1 tablet (5 mg total) by mouth every 6 (six) hours as needed for nausea or vomiting. Patient not taking: Reported on 08/20/2021 05/22/21   Minette Brine, FNP  rosuvastatin (CRESTOR) 20 MG tablet Take 1 tablet (20 mg total) by mouth daily. 07/18/21   Glendale Chard, MD  saccharomyces boulardii (FLORASTOR) 250 MG capsule Take 1 capsule (250 mg total) by mouth 2 (two) times daily. 08/22/21  Glendale Chard, MD  sucralfate (CARAFATE) 1 GM/10ML suspension Take 10 mLs (1 g total) by mouth 4 (four) times daily -  with meals and at bedtime for 28 days 07/10/21   Glendale Chard, MD     Allergies    Amoxicillin, Cefepime, Ampicillin, and Sulfa antibiotics   Review of Systems   Review of Systems Please see HPI for pertinent positives and negatives  Physical Exam BP (!) 162/88    Pulse (!) 109    Temp (!) 97.5 F (36.4 C) (Oral)    Resp 16    LMP  (LMP Unknown)    SpO2 96%   Physical Exam Vitals and nursing note reviewed.  Constitutional:       Appearance: Normal appearance.  HENT:     Head: Normocephalic and atraumatic.     Nose: Nose normal.     Mouth/Throat:     Mouth: Mucous membranes are dry.  Eyes:     Extraocular Movements: Extraocular movements intact.     Conjunctiva/sclera: Conjunctivae normal.  Cardiovascular:     Rate and Rhythm: Normal rate.  Pulmonary:     Effort: Pulmonary effort is normal.     Breath sounds: Normal breath sounds.  Abdominal:     General: Abdomen is flat.     Palpations: Abdomen is soft.     Tenderness: There is no abdominal tenderness.  Musculoskeletal:        General: No swelling. Normal range of motion.     Cervical back: Neck supple.  Skin:    General: Skin is warm and dry.  Neurological:     General: No focal deficit present.     Mental Status: She is alert. Mental status is at baseline.     Cranial Nerves: No cranial nerve deficit.     Sensory: No sensory deficit.     Motor: No weakness.  Psychiatric:        Mood and Affect: Mood normal.    ED Results / Procedures / Treatments   EKG EKG Interpretation  Date/Time:  Saturday August 24 2021 14:44:24 EST Ventricular Rate:  107 PR Interval:    QRS Duration: 81 QT Interval:  469 QTC Calculation: 626 R Axis:   44 Text Interpretation: Duplicate Confirmed by Calvert Cantor [phone]) on 08/24/2021 2:53:54 PM  Procedures Procedures  Medications Ordered in the ED Medications  meropenem (MERREM) 1 g in sodium chloride 0.9 % 100 mL IVPB (has no administration in time range)  lactated ringers bolus 1,000 mL (1,000 mLs Intravenous New Bag/Given 08/24/21 1455)    Initial Impression and Plan  Patient here with generalized weakness, confusion, urinary frequency and recent positive urine culture. Will check labs including CBC and CMP, recheck UA. CT head given confusion and recent fall. EKG for general weakness. Begin IVF and monitor in the ED. Currently NSR on cardiac monitor.   ED Course   Clinical Course as of 08/24/21 1633   Sat Aug 24, 2021  1507 CBC is unremarkable.  [CS]  7209 CMP with mild AKI, mild acidosis. No significant elyte abnormalities otherwise.  [CS]  4709 CT images and results reviewed, no acute findings.  [CS]  6283 UA with moderate WBC, small LE, otherwise no signs of infection. She was given fosfomycin for ESBL UTI at last hospital admission about a month ago.   [CS]  6629 Patient's daughter is gone to get something to eat. Patient now reports persistent cough. She had a neg Covid swab earlier this week, given plans to  admit for AKI and ESBL will add Covid test today. Check CXR [CS]  4103 Per RN, patient now also vomiting. Will give a dose of Compazine. Spoke with Pharmacy who recommends Meropenem for Abx choice.  [CS]  0131 Patient reports she had some gagging after coughing and wants something to drink. Will cancel antiemetics for now.  [CS]  73 CXR is clear [CS]  4388 Spoke with Dr. Thereasa Solo, Hospitalist, who will evaluate the patient for admission.  [CS]    Clinical Course User Index [CS] Truddie Hidden, MD     MDM Rules/Calculators/A&P Medical Decision Making Problems Addressed: AKI (acute kidney injury) [facility]): acute illness or injury that poses a threat to life or bodily functions ESBL (extended spectrum beta-lactamase) producing bacteria infection: acute illness or injury that poses a threat to life or bodily functions Weakness: acute illness or injury that poses a threat to life or bodily functions  Amount and/or Complexity of Data Reviewed External Data Reviewed: labs and notes. Labs: ordered. Decision-making details documented in ED Course. Radiology: ordered and independent interpretation performed. Decision-making details documented in ED Course. ECG/medicine tests: ordered and independent interpretation performed. Decision-making details documented in ED Course.  Risk Prescription drug management. Decision regarding hospitalization.    Final Clinical Impression(s)  / ED Diagnoses Final diagnoses:  Weakness  AKI (acute kidney injury) ([place])  ESBL (extended spectrum beta-lactamase) producing bacteria infection    Rx / DC Orders ED Discharge Orders     None        Truddie Hidden, MD 08/24/21 [phone]

## 2021-08-25 LAB — COMPREHENSIVE METABOLIC PANEL
ALT: 12 U/L (ref 0–44)
AST: 19 U/L (ref 15–41)
Albumin: 3.3 g/dL — ABNORMAL LOW (ref 3.5–5.0)
Alkaline Phosphatase: 48 U/L (ref 38–126)
Anion gap: 11 (ref 5–15)
BUN: 27 mg/dL — ABNORMAL HIGH (ref 8–23)
CO2: 20 mmol/L — ABNORMAL LOW (ref 22–32)
Calcium: 9 mg/dL (ref 8.9–10.3)
Chloride: 105 mmol/L (ref 98–111)
Creatinine, Ser: 1.63 mg/dL — ABNORMAL HIGH (ref 0.44–1.00)
GFR, Estimated: 31 mL/min — ABNORMAL LOW (ref >60)
Glucose, Bld: 88 mg/dL (ref 70–99)
Potassium: 3.6 mmol/L (ref 3.5–5.1)
Sodium: 136 mmol/L (ref 135–145)
Total Bilirubin: 0.8 mg/dL (ref 0.3–1.2)
Total Protein: 6 g/dL — ABNORMAL LOW (ref 6.5–8.1)

## 2021-08-25 LAB — PHOSPHORUS: Phosphorus: 3.8 mg/dL (ref 2.5–4.6)

## 2021-08-25 LAB — CBC
HCT: 35.5 % — ABNORMAL LOW (ref 36.0–46.0)
Hemoglobin: 11.2 g/dL — ABNORMAL LOW (ref 12.0–15.0)
MCH: 26.9 pg (ref 26.0–34.0)
MCHC: 31.5 g/dL (ref 30.0–36.0)
MCV: 85.3 fL (ref 80.0–100.0)
Platelets: 375 10*3/uL (ref 150–400)
RBC: 4.16 MIL/uL (ref 3.87–5.11)
RDW: 14.9 % (ref 11.5–15.5)
WBC: 7.6 10*3/uL (ref 4.0–10.5)
nRBC: 0 % (ref 0.0–0.2)

## 2021-08-25 LAB — GLUCOSE, CAPILLARY
Glucose-Capillary: 102 mg/dL — ABNORMAL HIGH (ref 70–99)
Glucose-Capillary: 88 mg/dL (ref 70–99)
Glucose-Capillary: 128 mg/dL — ABNORMAL HIGH (ref 70–99)

## 2021-08-25 LAB — MAGNESIUM: Magnesium: 2.3 mg/dL (ref 1.7–2.4)

## 2021-08-25 MED ORDER — INSULIN ASPART 100 UNIT/ML IJ SOLN
0.0000 [IU] | Freq: Three times a day (TID) | INTRAMUSCULAR | Status: DC
  Administered 2021-08-26: 1 [IU] via SUBCUTANEOUS

## 2021-08-25 MED ORDER — ENSURE ENLIVE PO LIQD
237.0000 mL | Freq: Two times a day (BID) | ORAL | Status: DC
  Administered 2021-08-25 – 2021-08-26 (×3): 237 mL via ORAL

## 2021-08-25 NOTE — Plan of Care (Signed)

## 2021-08-25 NOTE — Evaluation (Signed)
Occupational Therapy Evaluation Patient Details Name: Anna Hoffman MRN: [id number] DOB: 18-Jul-1935 Today's Date: 08/25/2021   History of Present Illness patient is a 87 year old female who presented to the hosptial with increased confusion and lethargy. patient was admitted with UTI. PMH: esophagitis, CKD III, colitis, hypertrophic cardiomyopathy, intracerebral hemorrhage, diabetes.   Clinical Impression   Patient is a 87 year old female who was admitted for above. Patient was noted to have increased need for assistance for transfers with mod x2 for sit to stand off bed with RW and then out of recliner was able to progress to min A x2. Patient was noted to require increased assistance for LB Dressing/bathing tasks as well on this date. Patient was noted to have decreased functional activity tolerance, decreased safety awareness, increased pain in RUE, decreased endurance, and decreased standing balance impacting participation in ADLs. Patient recommended to have 24/7 caregiver support/supervision in next level of care for safety. Patient would continue to benefit from skilled OT services at this time while admitted and after d/c to address noted deficits in order to improve overall safety and independence in ADLs.        Recommendations for follow up therapy are one component of a multi-disciplinary discharge planning process, led by the attending physician.  Recommendations may be updated based on patient status, additional functional criteria and insurance authorization.   Follow Up Recommendations  Home health OT    Assistance Recommended at Discharge Frequent or constant Supervision/Assistance  Patient can return home with the following A little help with walking and/or transfers;A little help with bathing/dressing/bathroom;Assistance with cooking/housework;Direct supervision/assist for financial management;Direct supervision/assist for medications management;Help with stairs or ramp for  entrance    Functional Status Assessment  Patient has had a recent decline in their functional status and demonstrates the ability to make significant improvements in function in a reasonable and predictable amount of time.  Equipment Recommendations  None recommended by OT    Recommendations for Other Services       Precautions / Restrictions Precautions Precautions: Fall Restrictions Weight Bearing Restrictions: No      Mobility Bed Mobility Overal bed mobility: Needs Assistance Bed Mobility: Supine to Sit     Supine to sit: Min assist     General bed mobility comments: physical assist for movement of BLE and trunk into midline position on edge of bed with head of bed raised.    Transfers                          Balance Overall balance assessment: Needs assistance Sitting-balance support: No upper extremity supported, Feet supported Sitting balance-Leahy Scale: Fair     Standing balance support: Reliant on assistive device for balance, Bilateral upper extremity supported Standing balance-Leahy Scale: Poor                             ADL either performed or assessed with clinical judgement   ADL Overall ADL's : Needs assistance/impaired Eating/Feeding: Set up;Sitting   Grooming: Wash/dry hands;Sitting;Brushing hair Grooming Details (indicate cue type and reason): EOB Upper Body Bathing: Minimal assistance;Sitting   Lower Body Bathing: Moderate assistance;Sit to/from stand;Sitting/lateral leans   Upper Body Dressing : Minimal assistance;Sitting   Lower Body Dressing: Sit to/from stand;Sitting/lateral leans;Moderate assistance Lower Body Dressing Details (indicate cue type and reason): patient attempted to adjust sock at bed level but unsuccessful. Toilet Transfer: Minimal assistance;+2 for safety/equipment;+2  for physical assistance Toilet Transfer Details (indicate cue type and reason): patient was mod A +2 for sit to stand off edge  of bed with RW but was able to transfer out of recliner with min A x2 later in session. patient reported being stiff. noted to need continued cues to slow down transition from stand to sit Toileting- Water quality scientist and Hygiene: Moderate assistance;Sit to/from stand       Functional mobility during ADLs: Minimal assistance;+2 for safety/equipment       Vision Baseline Vision/History: 1 Wears glasses Patient Visual Report: No change from baseline       Perception     Praxis      Pertinent Vitals/Pain Pain Assessment Pain Assessment: Faces Faces Pain Scale: Hurts little more Pain Location: R shoulder Pain Descriptors / Indicators: Grimacing, Discomfort Pain Intervention(s): Monitored during session     Hand Dominance Right   Extremity/Trunk Assessment Upper Extremity Assessment Upper Extremity Assessment: RUE deficits/detail RUE Deficits / Details: reports pain with movement but no deficits in range noted. patient reproted rotator cuff tear. patient noted to have IV in R posterior wrist with possible inflitration noted. nursing aware.   Lower Extremity Assessment Lower Extremity Assessment: Defer to PT evaluation   Cervical / Trunk Assessment Cervical / Trunk Assessment: Normal   Communication Communication Communication: No difficulties   Cognition Arousal/Alertness: Awake/alert Behavior During Therapy: WFL for tasks assessed/performed Overall Cognitive Status: Within Functional Limits for tasks assessed                                 General Comments: patient was oriented to self, place and year. patient noted to have continued confusion with ammount of care provided at home. pleasant and cooperative     General Comments       Exercises     Shoulder Instructions      Home Living Family/patient expects to be discharged to:: Private residence Living Arrangements: Children Available Help at Discharge: Family;Available  PRN/intermittently Type of Home: House Home Access: Stairs to enter [employer] of Steps: 2-3 Entrance Stairs-Rails: Right;Left;Can reach both Home Layout: One level     Bathroom Shower/Tub: Teacher, early years/pre: Standard Bathroom Accessibility: Yes   Home Equipment: Conservation officer, nature (2 wheels);BSC/3in1          Prior Functioning/Environment                 ADLs Comments: patient reported using RW at home to get around with therapy coming to see her on tuesdays and thursdays. recommendation from last hosptialization was for patient to have 24/7 caregiver support at home.        OT Problem List: Decreased activity tolerance;Impaired balance (sitting and/or standing);Decreased safety awareness;Decreased knowledge of precautions;Decreased knowledge of use of DME or AE      OT Treatment/Interventions: Self-care/ADL training;Therapeutic exercise;Energy conservation;DME and/or AE instruction;Therapeutic activities;Balance training;Patient/family education    OT Goals(Current goals can be found in the care plan section) Acute Rehab OT Goals Patient Stated Goal: to go back home OT Goal Formulation: With patient Time For Goal Achievement: 09/08/21 Potential to Achieve Goals: Good  OT Frequency: Min 2X/week    Co-evaluation PT/OT/SLP Co-Evaluation/Treatment: Yes Reason for Co-Treatment: To address functional/ADL transfers PT goals addressed during session: Mobility/safety with mobility OT goals addressed during session: ADL's and self-care      AM-PAC OT "6 Clicks" Daily Activity     Outcome Measure Help  from another person eating meals?: A Little Help from another person taking care of personal grooming?: A Little Help from another person toileting, which includes using toliet, bedpan, or urinal?: A Lot Help from another person bathing (including washing, rinsing, drying)?: A Lot Help from another person to put on and taking off regular upper body  clothing?: A Lot Help from another person to put on and taking off regular lower body clothing?: A Lot 6 Click Score: 14   End of Session Equipment Utilized During Treatment: Gait belt;Rolling walker (2 wheels) Nurse Communication: Mobility status;Other (comment) (IV with walnut size bubble distal to IV site posterior wrist)  Activity Tolerance: Patient tolerated treatment well Patient left: in chair;with call bell/phone within reach;with chair alarm set  OT Visit Diagnosis: Unsteadiness on feet (R26.81);Muscle weakness (generalized) (M62.81);Pain Pain - Right/Left: Right Pain - part of body: Shoulder                Time: 1410-1441 OT Time Calculation (min): 31 min Charges:  OT General Charges $OT Visit: 1 Visit OT Evaluation $OT Eval Low Complexity: 1 Low  Leota Sauers, MS Acute Rehabilitation Department Office# [phone] Pager# [phone]   Marcellina Millin 08/25/2021, 3:50 PM

## 2021-08-25 NOTE — Progress Notes (Signed)
°   08/25/21 0221  Assess: MEWS Score  Temp 98.7 F (37.1 C)  BP 139/87  Pulse Rate (!) 115  ECG Heart Rate (!) 113  Resp 20  Level of Consciousness Alert  SpO2 97 %  O2 Device Room Air  Assess: MEWS Score  MEWS Temp 0  MEWS Systolic 0  MEWS Pulse 2  MEWS RR 0  MEWS LOC 0  MEWS Score 2  MEWS Score Color Yellow  Assess: if the MEWS score is Yellow or Red  Were vital signs taken at a resting state? Yes  Focused Assessment No change from prior assessment  Does the patient meet 2 or more of the SIRS criteria? No  Does the patient have a confirmed or suspected source of infection? No  MEWS guidelines implemented *See Row Information* Yes  Treat  Pain Scale 0-10  Pain Score 0  Take Vital Signs  Increase Vital Sign Frequency  Yellow: Q 2hr X 2 then Q 4hr X 2, if remains yellow, continue Q 4hrs  Escalate  MEWS: Escalate Yellow: discuss with charge nurse/RN and consider discussing with provider and RRT  Notify: Charge Nurse/RN  Name of Charge Nurse/RN Notified Vera  Date Charge Nurse/RN Notified 08/25/21  Time Charge Nurse/RN Notified 0225  Document  Patient Outcome Other (Comment) (WCTM)  Assess: SIRS CRITERIA  SIRS Temperature  0  SIRS Pulse 1  SIRS Respirations  0  SIRS WBC 0  SIRS Score Sum  1

## 2021-08-25 NOTE — Evaluation (Signed)
Physical Therapy Evaluation Patient Details Name: Anna Hoffman MRN: [id number] DOB: October 25, 1934 Today's Date: 08/25/2021  History of Present Illness  patient is a 87 year old female who presented to the hosptial with increased confusion and lethargy. patient was admitted with UTI. PMH: esophagitis, CKD III, colitis, hypertrophic cardiomyopathy, intracerebral hemorrhage, diabetes.  Clinical Impression  Pt admitted as above and presenting with functional mobility limitations 2* generalized weakness, R shoulder pain, decreased endurance and balance deficits.  Pt hopes to progress to dc home with assist of family and would benefit from continued HHPT services to further address deficits.     Recommendations for follow up therapy are one component of a multi-disciplinary discharge planning process, led by the attending physician.  Recommendations may be updated based on patient status, additional functional criteria and insurance authorization.  Follow Up Recommendations Home health PT (Pt reports is currently recieving HHPT)    Assistance Recommended at Discharge Frequent or constant Supervision/Assistance  Patient can return home with the following  A lot of help with walking and/or transfers;A little help with bathing/dressing/bathroom;Assistance with cooking/housework;Help with stairs or ramp for entrance;Assist for transportation    Equipment Recommendations None recommended by PT  Recommendations for Other Services       Functional Status Assessment Patient has had a recent decline in their functional status and demonstrates the ability to make significant improvements in function in a reasonable and predictable amount of time.     Precautions / Restrictions Precautions Precautions: Fall Restrictions Weight Bearing Restrictions: No      Mobility  Bed Mobility Overal bed mobility: Needs Assistance Bed Mobility: Supine to Sit     Supine to sit: Min assist     General bed  mobility comments: physical assist for movement of BLE and trunk into midline position on edge of bed with head of bed raised.    Transfers Overall transfer level: Needs assistance Equipment used: Rolling walker (2 wheels) Transfers: Sit to/from Stand Sit to Stand: Mod assist, +2 physical assistance, +2 safety/equipment           General transfer comment: cues for transition position and use of UEs to self assist.  Physical assist to bring wt up from bedside and fwd and to balance in initial standing.  Decreased assist to stand from recliner    Ambulation/Gait Ambulation/Gait assistance: Min assist Gait Distance (Feet): 54 Feet (54' twice with seated rest break between) Assistive device: Rolling walker (2 wheels) Gait Pattern/deviations: Step-through pattern, Decreased step length - right, Decreased step length - left, Shuffle, Trunk flexed Gait velocity: decr     General Gait Details: cues for posture and position from RW;  Assist for balance and RW management  Stairs            Wheelchair Mobility    Modified Rankin (Stroke Patients Only)       Balance Overall balance assessment: Needs assistance Sitting-balance support: No upper extremity supported, Feet supported Sitting balance-Leahy Scale: Fair     Standing balance support: Reliant on assistive device for balance, Bilateral upper extremity supported Standing balance-Leahy Scale: Poor                               Pertinent Vitals/Pain Pain Assessment Pain Assessment: Faces Faces Pain Scale: Hurts little more Pain Location: R shoulder Pain Descriptors / Indicators: Grimacing, Discomfort Pain Intervention(s): Limited activity within patient's tolerance, Monitored during session    Home Living Family/patient expects  to be discharged to:: Private residence Living Arrangements: Children Available Help at Discharge: Family;Available PRN/intermittently Type of Home: House Home Access: Stairs  to enter Entrance Stairs-Rails: Right;Left;Can reach both Entrance Stairs-Number of Steps: 2-3   Home Layout: One level Home Equipment: Conservation officer, nature (2 wheels);BSC/3in1      Prior Function                 ADLs Comments: patient reported using RW at home to get around with therapy coming to see her on tuesdays and thursdays. recommendation from last hosptialization was for patient to have 24/7 caregiver support at home.     Hand Dominance   Dominant Hand: Right    Extremity/Trunk Assessment   Upper Extremity Assessment Upper Extremity Assessment: RUE deficits/detail RUE Deficits / Details: reports pain with movement but no deficits in range noted. patient reproted rotator cuff tear. patient noted to have IV in R posterior wrist with possible inflitration noted. nursing aware.    Lower Extremity Assessment Lower Extremity Assessment: Generalized weakness    Cervical / Trunk Assessment Cervical / Trunk Assessment: Normal  Communication   Communication: No difficulties  Cognition Arousal/Alertness: Awake/alert Behavior During Therapy: WFL for tasks assessed/performed Overall Cognitive Status: Within Functional Limits for tasks assessed                                 General Comments: patient was oriented to self, place and year. patient noted to have continued confusion with ammount of care provided at home. pleasant and cooperative        General Comments      Exercises     Assessment/Plan    PT Assessment Patient needs continued PT services  PT Problem List Decreased strength;Decreased activity tolerance;Decreased balance;Decreased mobility;Decreased knowledge of use of DME;Decreased safety awareness       PT Treatment Interventions DME instruction;Gait training;Stair training;Functional mobility training;Therapeutic activities;Therapeutic exercise;Patient/family education;Balance training    PT Goals (Current goals can be found in the Care  Plan section)  Acute Rehab PT Goals Patient Stated Goal: HOME PT Goal Formulation: With patient Time For Goal Achievement: 09/08/21 Potential to Achieve Goals: Good    Frequency Min 3X/week     Co-evaluation PT/OT/SLP Co-Evaluation/Treatment: Yes Reason for Co-Treatment: To address functional/ADL transfers PT goals addressed during session: Mobility/safety with mobility OT goals addressed during session: ADL's and self-care       AM-PAC PT "6 Clicks" Mobility  Outcome Measure Help needed turning from your back to your side while in a flat bed without using bedrails?: A Little Help needed moving from lying on your back to sitting on the side of a flat bed without using bedrails?: A Lot Help needed moving to and from a bed to a chair (including a wheelchair)?: A Lot Help needed standing up from a chair using your arms (e.g., wheelchair or bedside chair)?: A Little Help needed to walk in hospital room?: A Little Help needed climbing 3-5 steps with a railing? : A Lot 6 Click Score: 15    End of Session Equipment Utilized During Treatment: Gait belt Activity Tolerance: Patient tolerated treatment well;Patient limited by fatigue Patient left: in chair;with call bell/phone within reach;with chair alarm set;with nursing/sitter in room Nurse Communication: Mobility status PT Visit Diagnosis: Unsteadiness on feet (R26.81);Muscle weakness (generalized) (M62.81);History of falling (Z91.81)    Time: 0240-9735 PT Time Calculation (min) (ACUTE ONLY): 31 min   Charges:   PT Evaluation $  PT Eval Low Complexity: 1 Low          [facility] Pager [phone] Office [phone]   [facility] 08/25/2021, 4:52 PM

## 2021-08-25 NOTE — Progress Notes (Signed)
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  She was admitted to the hospital [id number] with metabolic encephalopathy related to an ESBL UTI treated with fosfomycin. This was following an admission in November 2022 for an ischemic CVA.     The patient was seen in her primary care physician's office 1/17 with symptoms of a UTI to include dysuria and progressive confusion.  She was dosed with ceftriaxone during the visit and sent home with Augmentin for empiric treatment.  Cultures obtained during that visit ultimately revealed ESBL E. coli.   Her family brought her to the [facility] ED because her confusion and lethargy were worsening, and she had developed nausea with vomiting related to the antibiotic she was prescribed as an outpatient.  She continued to have urinary frequency as well.  CT head in the ER was unrevealing.  Consultants:  None  Code Status: FULL CODE  Antimicrobials:  Meropenem 1/21 >  DVT prophylaxis: Lovenox  Interim Hx: Some low-grade persisting tachycardia noted overnight.  Afebrile since admission.  Blood pressure stable.  Saturation 95% room air.  Renal function improving.  WBC trended down to well within normal range.  Alert and pleasant at time of my exam.  Reports no further nausea but admits that she did vomit when she ate breakfast, though she attributes this to "eating too much too fast because I was very hungry."  Denies chest pain or shortness of breath.  Denies diarrhea.  Assessment & Plan:  ESBL E coli UTI Continue directed abx tx and monitor clinically    AKI on CKD stage IIIa Baseline creatinine reportedly approximately 1.3 -creatinine 1.82 at presentation -clinically most  consistent with prerenal azotemia -improving with hydration -recheck in a.m.  Recent Labs  Lab 08/24/21 1432 08/25/21 0526  CREATININE 1.82* 1.63*     Sinus tachycardia Likely due to a combination of DH as well as inability to keep BB down -improving with resumption of home beta-blocker and gentle hydration   Mild hyponatremia Clinically most consistent with hypovolemia -corrected with volume expansion   Uncontrolled HTN Blood pressure now well controlled with resumption of home medications  Hypertrophic cardiomyopathy Appears well compensated presently    2-1 AV block with paroxysmal syncope Monitor on tele - is being worked up for pacer by EP as outpt    DM2 CBG well controlled   Family Communication: No family present at time of exam Disposition: From home -anticipate return home when able to tolerate oral intake and adequate antibiotic therapy completed  Objective: Blood pressure 124/79, pulse 100, temperature 98 F (36.7 C), temperature source Oral, resp. rate 18, height 5' 5.5" (1.664 m), weight 54.1 kg, SpO2 95 %.  Intake/Output Summary (Last 24 hours) at 08/25/2021 1043 Last data filed at 08/25/2021 0900 Gross per 24 hour  Intake 2007.83 ml  Output --  Net 2007.83 ml   Filed Weights   08/25/21 0450  Weight: 54.1 kg    Examination: General: No acute respiratory distress Lungs: Clear to auscultation bilaterally without wheezes or crackles Cardiovascular: Regular rate and rhythm without murmur gallop or rub normal S1 and S2 Abdomen: Nontender, nondistended, soft, bowel sounds positive, no rebound, no ascites, no appreciable mass Extremities: No significant cyanosis, clubbing, or edema bilateral lower extremities  CBC: Recent Labs  Lab 08/24/21 1432 08/25/21 0526  WBC 9.8 7.6  NEUTROABS 6.4  --   HGB 13.7 11.2*  HCT 42.8 35.5*  MCV 84.6 85.3  PLT 489* 161   Basic Metabolic Panel: Recent Labs  Lab 08/24/21 1432 08/25/21 0526  NA 134* 136  K  4.3 3.6  CL 105 105  CO2 14* 20*  GLUCOSE 101* 88  BUN 29* 27*  CREATININE 1.82* 1.63*  CALCIUM 9.6 9.0  MG  --  2.3  PHOS  --  3.8   GFR: Estimated Creatinine Clearance: 21.9 mL/min (A) (by C-G formula based on SCr of 1.63 mg/dL (H)).  Liver Function Tests: Recent Labs  Lab 08/24/21 1432 08/25/21 0526  AST 26 19  ALT 15 12  ALKPHOS 62 48  BILITOT 1.0 0.8  PROT 7.7 6.0*  ALBUMIN 4.3 3.3*   HbA1C: Hgb A1c MFr Bld  Date/Time Value Ref Range Status  07/12/2021 09:01 PM 5.8 (H) 4.8 - 5.6 % Final    Comment:    (NOTE) Pre diabetes:          5.7%-6.4%  Diabetes:              >6.4%  Glycemic control for   <7.0% adults with diabetes   05/28/2021 04:56 AM 6.2 (H) 4.8 - 5.6 % Final    Comment:    (NOTE) Pre diabetes:          5.7%-6.4%  Diabetes:              >6.4%  Glycemic control for   <7.0% adults with diabetes     CBG: Recent Labs  Lab 08/24/21 2136  GLUCAP 82    Recent Results (from the past 240 hour(s))  Urine Culture     Status: Abnormal   Collection Time: 08/20/21  4:54 PM   Specimen: Urine   UR  Result Value Ref Range Status   Urine Culture, Routine Final report (A)  Final   Organism ID, Bacteria Escherichia coli (A)  Final    Comment: Multi-Drug Resistant Organism Susceptibility profile is consistent with a probable ESBL. Greater than 100,000 colony forming units per mL    Antimicrobial Susceptibility Comment  Final    Comment:       ** S = Susceptible; I = Intermediate; R = Resistant **                    P = Positive; N = Negative             MICS are expressed in micrograms per mL    Antibiotic                 RSLT#1    RSLT#2    RSLT#3    RSLT#4 Amoxicillin/Clavulanic Acid    S Ampicillin                     R Cefazolin                      R Cefepime                       S Ceftriaxone                    R Cefuroxime                     R Ciprofloxacin  R Ertapenem                      S Gentamicin                      S Imipenem                       S Levofloxacin                   R Meropenem                      S Nitrofurantoin                 S Piperacillin/Tazobactam        S Tetracycline                   R Tobramycin                     S Trimethoprim/Sulfa             R   Novel Coronavirus, NAA ([facility])     Status: None   Collection Time: 08/20/21  4:56 PM   Specimen: Nasopharyngeal(NP) swabs in vial transport medium  Result Value Ref Range Status   SARS-CoV-2, NAA Not Detected Not Detected Final    Comment: This nucleic acid amplification test was developed and its performance characteristics determined by [employer]. Nucleic acid amplification tests include RT-PCR and TMA. This test has not been FDA cleared or approved. This test has been authorized by FDA under an Emergency Use Authorization (EUA). This test is only authorized for the duration of time the declaration that circumstances exist justifying the authorization of the emergency use of in vitro diagnostic tests for detection of SARS-CoV-2 virus and/or diagnosis of COVID-19 infection under section 564(b)(1) of the Act, 21 U.S.C. 710GYI-9(S) (1), unless the authorization is terminated or revoked sooner. When diagnostic testing is negative, the possibility of a false negative result should be considered in the context of a patient's recent exposures and the presence of clinical signs and symptoms consistent with COVID-19. An individual without symptoms of COVID-19 and who is not shedding SARS-CoV-2 virus wo uld expect to have a negative (not detected) result in this assay.   SARS-COV-2, NAA 2 DAY TAT     Status: None   Collection Time: 08/20/21  4:56 PM  Result Value Ref Range Status   SARS-CoV-2, NAA 2 DAY TAT Performed  Final  Resp Panel by RT-PCR (Flu A&B, Covid) Nasopharyngeal Swab     Status: None   Collection Time: 08/24/21  3:56 PM   Specimen: Nasopharyngeal Swab; Nasopharyngeal(NP) swabs in vial  transport medium  Result Value Ref Range Status   SARS Coronavirus 2 by RT PCR NEGATIVE NEGATIVE Final    Comment: (NOTE) SARS-CoV-2 target nucleic acids are NOT DETECTED.  The SARS-CoV-2 RNA is generally detectable in upper respiratory specimens during the acute phase of infection. The lowest concentration of SARS-CoV-2 viral copies this assay can detect is 138 copies/mL. A negative result does not preclude SARS-Cov-2 infection and should not be used as the sole basis for treatment or other patient management decisions. A negative result may occur with  improper specimen collection/handling, submission of specimen other than nasopharyngeal swab, presence of viral mutation(s) within the areas targeted by this assay, and inadequate number of viral copies(<138 copies/mL). A negative result  must be combined with clinical observations, patient history, and epidemiological information. The expected result is Negative.  Fact Sheet for Patients:  EntrepreneurPulse.com.au  Fact Sheet for Healthcare Providers:  IncredibleEmployment.be  This test is no t yet approved or cleared by the [place] FDA and  has been authorized for detection and/or diagnosis of SARS-CoV-2 by FDA under an Emergency Use Authorization (EUA). This EUA will remain  in effect (meaning this test can be used) for the duration of the COVID-19 declaration under Section 564(b)(1) of the Act, 21 U.S.C.section 360bbb-3(b)(1), unless the authorization is terminated  or revoked sooner.       Influenza A by PCR NEGATIVE NEGATIVE Final   Influenza B by PCR NEGATIVE NEGATIVE Final    Comment: (NOTE) The Xpert Xpress SARS-CoV-2/FLU/RSV plus assay is intended as an aid in the diagnosis of influenza from Nasopharyngeal swab specimens and should not be used as a sole basis for treatment. Nasal washings and aspirates are unacceptable for Xpert Xpress SARS-CoV-2/FLU/RSV testing.  Fact  Sheet for Patients: EntrepreneurPulse.com.au  Fact Sheet for Healthcare Providers: IncredibleEmployment.be  This test is not yet approved or cleared by the [place] FDA and has been authorized for detection and/or diagnosis of SARS-CoV-2 by FDA under an Emergency Use Authorization (EUA). This EUA will remain in effect (meaning this test can be used) for the duration of the COVID-19 declaration under Section 564(b)(1) of the Act, 21 U.S.C. section 360bbb-3(b)(1), unless the authorization is terminated or revoked.  Performed at [facility], [place] [address], [place], [place] [address]      Scheduled Meds:  aspirin EC  81 mg Oral Daily   enoxaparin (LOVENOX) injection  30 mg Subcutaneous Q24H   feeding supplement  237 mL Oral BID BM   hydrALAZINE  100 mg Oral Q8H   metoprolol tartrate  12.5 mg Oral BID   pantoprazole  40 mg Oral BID   saccharomyces boulardii  250 mg Oral BID   senna  1 tablet Oral BID   Continuous Infusions:  lactated ringers 100 mL/hr at 08/25/21 0659   meropenem (MERREM) IV Stopped (08/25/21 9826)     LOS: 1 day   Cherene Altes, MD [facility] Office  [phone] Pager - Text Page per Shea Evans  If 7PM-7AM, please contact night-coverage per Amion 08/25/2021, 10:43 AM

## 2021-08-26 ENCOUNTER — Ambulatory Visit: Payer: Medicare Other

## 2021-08-26 DIAGNOSIS — I10 Essential (primary) hypertension: Secondary | ICD-10-CM

## 2021-08-26 DIAGNOSIS — N1831 Chronic kidney disease, stage 3a: Secondary | ICD-10-CM

## 2021-08-26 DIAGNOSIS — E1122 Type 2 diabetes mellitus with diabetic chronic kidney disease: Secondary | ICD-10-CM

## 2021-08-26 DIAGNOSIS — R112 Nausea with vomiting, unspecified: Secondary | ICD-10-CM

## 2021-08-26 DIAGNOSIS — E43 Unspecified severe protein-calorie malnutrition: Secondary | ICD-10-CM

## 2021-08-26 LAB — BASIC METABOLIC PANEL
Anion gap: 8 (ref 5–15)
BUN: 25 mg/dL — ABNORMAL HIGH (ref 8–23)
CO2: 21 mmol/L — ABNORMAL LOW (ref 22–32)
Calcium: 8.5 mg/dL — ABNORMAL LOW (ref 8.9–10.3)
Chloride: 105 mmol/L (ref 98–111)
Creatinine, Ser: 1.64 mg/dL — ABNORMAL HIGH (ref 0.44–1.00)
GFR, Estimated: 31 mL/min — ABNORMAL LOW (ref >60)
Glucose, Bld: 89 mg/dL (ref 70–99)
Potassium: 3.4 mmol/L — ABNORMAL LOW (ref 3.5–5.1)
Sodium: 134 mmol/L — ABNORMAL LOW (ref 135–145)

## 2021-08-26 LAB — GLUCOSE, CAPILLARY
Glucose-Capillary: 96 mg/dL (ref 70–99)
Glucose-Capillary: 100 mg/dL — ABNORMAL HIGH (ref 70–99)
Glucose-Capillary: 132 mg/dL — ABNORMAL HIGH (ref 70–99)
Glucose-Capillary: 102 mg/dL — ABNORMAL HIGH (ref 70–99)

## 2021-08-26 MED ORDER — KATE FARMS STANDARD 1.4 PO LIQD
325.0000 mL | Freq: Two times a day (BID) | ORAL | Status: DC
  Administered 2021-08-26 – 2021-08-27 (×3): 325 mL via ORAL
  Filled 2021-08-26 (×5): qty 325

## 2021-08-26 MED ORDER — ADULT MULTIVITAMIN W/MINERALS CH
1.0000 | ORAL_TABLET | Freq: Every day | ORAL | Status: DC
  Administered 2021-08-26 – 2021-08-28 (×3): 1 via ORAL
  Filled 2021-08-26 (×3): qty 1

## 2021-08-26 NOTE — Progress Notes (Signed)
Initial Nutrition Assessment  DOCUMENTATION CODES:   Severe malnutrition in context of chronic illness  INTERVENTION:   -[employer] 1.4 PO BID, each provides 455 kcals and 20g protein  -Multivitamin with minerals daily  NUTRITION DIAGNOSIS:   Severe Malnutrition related to chronic illness (CKD, h/o CVA) as evidenced by percent weight loss, moderate fat depletion, severe muscle depletion.   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  She was admitted to the hospital [id number]/[id number]/[id number] with metabolic encephalopathy related to an ESBL UTI treated with fosfomycin. This was following an admission in November 2022 for an ischemic CVA.       The patient was seen in her primary care physician's office 1/17 with symptoms of a UTI to include dysuria and progressive confusion.  Admitted for worsening confusion and lethargy.  Patient in room, states she feels much better today. She has had no N/V today. Pt states she was unable to tolerate any PO over the past week. Pt consumed 100% of breakfast this morning. States she is willing to try protein shakes but Ensure causes diarrhea. Pt has had [employer] in the past and she liked it. Will order this instead.  Denies issues with swallowing or chewing.  Per weight records, pt has lost 18 lbs since 05/10/21 (13% wt loss x 3.5 months, significant for time frame).  Medications: Florastor, Senokot, Lactated ringers, Miralax  Labs reviewed:  CBGs: 88-128 Low Na, K  NUTRITION - FOCUSED PHYSICAL EXAM:  Flowsheet Row Most Recent Value  Orbital Region Mild  depletion  Upper Arm Region Severe depletion  Buccal Region Moderate depletion  Temple Region Severe depletion  Clavicle Bone Region Severe depletion  Clavicle and Acromion Bone Region Severe depletion  Scapular Bone Region Severe depletion  Dorsal Hand Severe depletion  Patellar Region Severe depletion  Anterior Thigh Region Severe depletion  Posterior Calf Region Severe depletion  Edema (RD Assessment) None  Hair Reviewed  Eyes Reviewed  Mouth Reviewed  Skin Reviewed  Nails Reviewed  [painted]       Diet Order:   Diet Order             Diet Carb Modified Fluid consistency: Thin; Room service appropriate? Yes  Diet effective now                   EDUCATION NEEDS:   No education needs have been identified at this time  Skin:  Skin Assessment: Reviewed RN Assessment  Last BM:  1/22  Height:   Ht Readings from Last 1 Encounters:  08/25/21 5' 5.5" (1.664 m)    Weight:   Wt Readings from Last 1 Encounters:  08/25/21 54.1 kg    BMI:  Body mass index is 19.55 kg/m.  Estimated Nutritional Needs:   Kcal:  1500-1700  Protein:  75-90g  Fluid:  1.7L/day   Clayton Bibles, MS, RD, LDN Inpatient Clinical Dietitian Contact information available via Amion

## 2021-08-26 NOTE — Chronic Care Management (AMB) (Signed)
Chronic Care Management    Social Work Note  08/26/2021 Name: Anna Hoffman MRN: [id number] DOB: 21-Sep-1934  Anna Hoffman is a 87 y.o. year old female who is a primary care patient of Glendale Chard, MD. The CCM team was consulted to assist the patient with chronic disease management and/or care coordination needs related to:  HTN, Colitis, DM II .   Collaboration with DSS  for  case update  in response to provider referral for social work chronic care management and care coordination services.   Consent to Services:  The patient was given information about Chronic Care Management services, agreed to services, and gave verbal consent prior to initiation of services.  Please see initial visit note for detailed documentation.   Patient agreed to services and consent obtained.   Assessment: Review of patient past medical history, allergies, medications, and health status, including review of relevant consultants reports was performed today as part of a comprehensive evaluation and provision of chronic care management and care coordination services.     SDOH (Social Determinants of Health) assessments and interventions performed:    Advanced Directives Status: Not addressed in this encounter.  CCM Care Plan  Allergies  Allergen Reactions   Amoxicillin Diarrhea    Has patient had a PCN reaction causing immediate rash, facial/tongue/throat swelling, SOB or lightheadedness with hypotension: no Has patient had a PCN reaction causing severe rash involving mucus membranes or skin necrosis: no Has patient had a PCN reaction that required hospitalization: no pharmacist consult Has patient had a PCN reaction occurring within the last 10 years: yes If all of the above answers are "NO", then may proceed with Cephalosporin use.    Cefepime Other (See Comments)    Possible CNS toxicity 05/2021   Ampicillin Rash    - Tolerates Rocephin and Ancef - Remote occurrence; no symptoms of anaphylaxis  or severe cutaneous reaction, and no additional medical attention required     Sulfa Antibiotics Rash    Facility-Administered Encounter Medications as of 08/26/2021  Medication   acetaminophen (TYLENOL) tablet 650 mg   aspirin EC tablet 81 mg   bisacodyl (DULCOLAX) suppository 10 mg   enoxaparin (LOVENOX) injection 30 mg   feeding supplement (KATE FARMS STANDARD 1.4) liquid 325 mL   hydrALAZINE (APRESOLINE) tablet 100 mg   insulin aspart (novoLOG) injection 0-9 Units   lactated ringers infusion   meropenem (MERREM) 1 g in sodium chloride 0.9 % 100 mL IVPB   metoprolol tartrate (LOPRESSOR) tablet 12.5 mg   multivitamin with minerals tablet 1 tablet   ondansetron (ZOFRAN) tablet 4 mg   Or   ondansetron (ZOFRAN) injection 4 mg   pantoprazole (PROTONIX) EC tablet 40 mg   polyethylene glycol (MIRALAX / GLYCOLAX) packet 17 g   saccharomyces boulardii (FLORASTOR) capsule 250 mg   senna (SENOKOT) tablet 8.6 mg   traMADol (ULTRAM) tablet 50 mg   [DISCONTINUED] feeding supplement (ENSURE ENLIVE / ENSURE PLUS) liquid 237 mL   Outpatient Encounter Medications as of 08/26/2021  Medication Sig Note   acetaminophen (TYLENOL) 325 MG tablet Take 650 mg by mouth every 6 (six) hours as needed for mild pain (Fever >/= 101). 08/25/2021: Med removed by accident.   amLODipine (NORVASC) 10 MG tablet Take 1 tablet (10 mg total) by mouth daily. (Patient not taking: Reported on 08/25/2021)    amoxicillin-clavulanate (AUGMENTIN) 500-125 MG tablet Take 1 tablet (500 mg total) by mouth every 12 (twelve) hours. (Patient not taking: Reported on 08/25/2021) 08/25/2021: Take  for 7 days. Started 08/22/21. Patient states she took for a couple days until it made her go to the bathroom excessively so she spoke with a pharmacist at [facility] who told her to stop taking it.   aspirin 81 MG EC tablet Take 1 tablet (81 mg total) by mouth daily. Swallow whole.    fexofenadine (ALLEGRA ALLERGY) 180 MG tablet Take 1 tablet (180 mg total)  by mouth daily.    glucose blood test strip Use as directed to check blood sugars 1 time per day.    glucose monitoring kit (FREESTYLE) monitoring kit Use as directed to check blood sugars 1 time per day dx: e11.22    hydrALAZINE (APRESOLINE) 100 MG tablet Take 1 tablet (100 mg total) by mouth every 8 (eight) hours.    insulin aspart (NOVOLOG) 100 UNIT/ML FlexPen Inject into the skin with meals per sliding scale. 151-200= 2 units, 201-250= 4 units, 251-300= 6 units, 301-350=8 units, 351-400=10 units, >401=10 units and notify MD (Patient not taking: Reported on 08/25/2021)    Lancets (ONETOUCH DELICA PLUS [id number]) MISC Use to check blood sugar once daily.    metoprolol tartrate (LOPRESSOR) 25 MG tablet Take 0.5 tablets (12.5 mg total) by mouth 2 (two) times daily.    pantoprazole (PROTONIX) 40 MG tablet Take 1 tablet (40 mg total) by mouth 2 (two) times daily. (Patient taking differently: Take 40 mg by mouth daily.)    rosuvastatin (CRESTOR) 20 MG tablet Take 1 tablet (20 mg total) by mouth daily.    saccharomyces boulardii (FLORASTOR) 250 MG capsule Take 1 capsule (250 mg total) by mouth 2 (two) times daily. (Patient not taking: Reported on 08/25/2021)    sucralfate (CARAFATE) 1 GM/10ML suspension Take 10 mLs (1 g total) by mouth 4 (four) times daily -  with meals and at bedtime for 28 days     Patient Active Problem List   Diagnosis Date Noted   Urinary tract infection 08/24/2021   Urinary tract infection due to extended-spectrum beta lactamase (ESBL) producing Escherichia coli 08/24/2021   Generalized weakness 26/33/3545   Acute metabolic encephalopathy    Malnutrition of moderate degree 05/24/2021   Sepsis ([place]) 05/23/2021   SIRS (systemic inflammatory response syndrome) ([place]) 05/22/2021   History of CVA (cerebrovascular accident) 05/22/2021   Gait instability 04/24/2021   Seasonal allergic rhinitis due to pollen 04/24/2021   Ascending aorta dilatation ([place]) 04/05/2021   HOCM  (hypertrophic obstructive cardiomyopathy) ([place]) 04/04/2021   Physical deconditioning 04/04/2021   Syncope 04/03/2021   Intractable vomiting with nausea 04/03/2021   Fall at home, initial encounter 03/31/2021   Thyroid nodule 03/31/2021   CAP (community acquired pneumonia) 11/15/2020   Dehydration 11/15/2020   Erosive esophagitis    Hiatal hernia    Hyponatremia 10/10/2020   Acute kidney injury superimposed on chronic kidney disease ([place]) 10/10/2020   Aortic atherosclerosis ([place]) 10/09/2020   Acute renal failure superimposed on stage 3a chronic kidney disease ([place]) 10/09/2020   Hyperkalemia 10/09/2020   Hearing loss 10/09/2020   Prolonged QT interval 10/09/2020   Colitis, acute 08/11/2019   Colitis 07/11/2019   Loose stools 06/07/2019   Herpes zoster without complication 62/56/3893   Other long term (current) drug therapy 08/12/2018   Chronic kidney disease, stage 3a ([place]) 07/19/2018   Hypertensive nephropathy 07/19/2018   Community acquired pneumonia of right upper lobe of lung 11/07/2017   Hypertensive emergency 07/01/2016   Thalamic hemorrhage ([place]) 07/01/2016   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[name])    Benign essential  HTN    Type 2 diabetes mellitus with stage 3 chronic kidney disease, without long-term current use of insulin ([place])    Mixed hyperlipidemia    ICH (intracerebral hemorrhage) ([place]) - hypertensive R thalamic hemorrhage  06/27/2016   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   Retroperitoneal bleed 02/08/2016   Generalized abdominal pain    Gastroenteritis 02/04/2016   Diarrhea 02/04/2016   Hypokalemia 02/04/2016   Incarcerated paraesophageal hernia 09/02/2014   Renal mass, left 09/02/2014   Acute esophagitis 09/02/2014   Gastric outlet obstruction 09/02/2014   Hypertension    Vomiting 09/01/2014    Conditions to be addressed/monitored: HTN, DMII, and Colitis ; Level of care concerns  Care Plan : Social Work Care Plan  Updates made by Daneen Schick since  08/26/2021 12:00 AM     Problem: Mobility and Independence      Goal: Mobility and Independence Optimized   Start Date: 04/24/2021  Expected End Date: 06/08/2021  Recent Progress: On track  Priority: High  Note:   Current Barriers:  Chronic disease management support and education needs related to HTN and DM  ADL IADL limitations and Limited access to caregiver  Social Worker Clinical Goal(s):  patient will work with SW to identify and address any acute and/or chronic care coordination needs related to the self health management of HTN and DM  patient will work with SW to address concerns related to level of care needs  SW Interventions:  Inter-disciplinary care team collaboration (see longitudinal plan of care) Collaboration with Glendale Chard, MD regarding development and update of comprehensive plan of care as evidenced by provider attestation and co-signature Attempted to contact DSS x 3 - on hold 30 minutes each time without assistance received Unsuccessful outbound call placed to [facility] Medicaid Worker [employer] voice message left requesting a return call Unsuccessful outbound call placed to [employer] with [facility] to request feedback on if a bed is still available and if the family could pay a pro-rated amount while awaiting Medicaid approval Performed chart review to note patient currently admitted to the hospital; communication sent to patients son with an update on SW interventions and barriers to placement   Patient Goals/Self-Care Activities patient will: with the help of her son  -  Review pros and cons for placement versus remaining in the home  Follow Up Plan:  SW will follow up with the patient over the next 10 days      Follow Up Plan: SW will follow up with patient by phone over the next 10 days      Daneen Schick, BSW, CDP Social Worker, Certified Dementia Practitioner [place] [facility] [place] [facility] [phone]

## 2021-08-26 NOTE — TOC Initial Note (Signed)
Transition of Care [facility]) - Initial/Assessment Note    Patient Details  Name: Anna Hoffman MRN: [id number] Date of Birth: 1935/07/08  [facility] CM/SW Contact:    Lynnell Catalan, RN Phone Number: 08/26/2021, 2:22 PM  Clinical Narrative:                 Pt was active with [facility] for home PT/OT prior to admission. Will need MD orders to resume at dc.  Expected Discharge Plan: [place] Barriers to Discharge: Continued Medical Work up  Expected Discharge Plan and Services Expected Discharge Plan: [place]   Discharge Planning Services: CM Consult   Living arrangements for the past 2 months: Single Family Home                    HH Arranged: PT, OT HH Agency: [place] Date [facility] Agency Contacted: 08/26/21 Time HH Agency Contacted: 73 Representative spoke with at [place]: [place] Arrangements/Services Living arrangements for the past 2 months: [place] with:: Adult Children Patient language and need for interpreter reviewed:: Yes        Need for Family Participation in Patient Care: Yes (Comment) Care giver support system in place?: Yes (comment) Current home services: Home OT, Home PT Criminal Activity/Legal Involvement Pertinent to Current Situation/Hospitalization: No - Comment as needed  Activities of Daily Living Home Assistive Devices/Equipment: CBG Meter, Walker (specify type), Shower chair with back, Bedside commode/3-in-1 (4 wheel) ADL Screening (condition at time of admission) Patient's cognitive ability adequate to safely complete daily activities?: Yes Is the patient deaf or have difficulty hearing?: Yes Does the patient have difficulty seeing, even when wearing glasses/contacts?: No Does the patient have difficulty concentrating, remembering, or making decisions?: Yes Patient able to express need for assistance with ADLs?: Yes Does the patient have difficulty  dressing or bathing?: Yes Independently performs ADLs?: No Communication: Independent Dressing (OT): Needs assistance Is this a change from baseline?: Change from baseline, expected to last <3days Grooming: Needs assistance Is this a change from baseline?: Change from baseline, expected to last <3 days Feeding: Independent Bathing: Needs assistance Is this a change from baseline?: Change from baseline, expected to last <3 days Toileting: Needs assistance Is this a change from baseline?: Change from baseline, expected to last <3 days In/Out Bed: Needs assistance Is this a change from baseline?: Change from baseline, expected to last <3 days Walks in Home: Needs assistance Is this a change from baseline?: Change from baseline, expected to last <3 days Does the patient have difficulty walking or climbing stairs?: Yes Weakness of Legs: Both Weakness of Arms/Hands: Both     Admission diagnosis:  UTI (urinary tract infection) [N39.0] Weakness [R53.1] Urinary tract infection [N39.0] ESBL (extended spectrum beta-lactamase) producing bacteria infection [A49.9, Z16.12] AKI (acute kidney injury) ([place]) [N17.9] Patient Active Problem List   Diagnosis Date Noted   Urinary tract infection 08/24/2021   Urinary tract infection due to extended-spectrum beta lactamase (ESBL) producing Escherichia coli 08/24/2021   Generalized weakness 46/96/2952   Acute metabolic encephalopathy    Malnutrition of moderate degree 05/24/2021   Sepsis ([place]) 05/23/2021   SIRS (systemic inflammatory response syndrome) ([place]) 05/22/2021   History of CVA (cerebrovascular accident) 05/22/2021   Gait instability 04/24/2021   Seasonal allergic rhinitis due to pollen 04/24/2021   Ascending aorta dilatation ([place]) 04/05/2021   HOCM (hypertrophic obstructive cardiomyopathy) ([place]) 04/04/2021   Physical deconditioning 04/04/2021  Syncope 04/03/2021   Intractable vomiting with nausea 04/03/2021   Fall at home, initial  encounter 03/31/2021   Thyroid nodule 03/31/2021   CAP (community acquired pneumonia) 11/15/2020   Dehydration 11/15/2020   Erosive esophagitis    Hiatal hernia    Hyponatremia 10/10/2020   Acute kidney injury superimposed on chronic kidney disease ([place]) 10/10/2020   Aortic atherosclerosis ([place]) 10/09/2020   Acute renal failure superimposed on stage 3a chronic kidney disease ([place]) 10/09/2020   Hyperkalemia 10/09/2020   Hearing loss 10/09/2020   Prolonged QT interval 10/09/2020   Colitis, acute 08/11/2019   Colitis 07/11/2019   Loose stools 06/07/2019   Herpes zoster without complication 33/29/5188   Other long term (current) drug therapy 08/12/2018   Chronic kidney disease, stage 3a ([place]) 07/19/2018   Hypertensive nephropathy 07/19/2018   Community acquired pneumonia of right upper lobe of lung 11/07/2017   Hypertensive emergency 07/01/2016   Thalamic hemorrhage ([place]) 07/01/2016   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[place])    Benign essential HTN    Type 2 diabetes mellitus with stage 3 chronic kidney disease, without long-term current use of insulin ([place])    Mixed hyperlipidemia    ICH (intracerebral hemorrhage) ([place]) - hypertensive R thalamic hemorrhage  06/27/2016   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 [place] [address] Phone: [phone] Fax: [phone]  [id number] [place], [place] [address] [place] [address] Phone: [phone] Fax: [phone]     Social Determinants of Health (SDOH)  Interventions    Readmission Risk Interventions Readmission Risk Prevention Plan 08/26/2021  Transportation Screening Complete  Medication Review ([place]) Complete  PCP or Specialist appointment within 3-5 days of discharge Complete  HRI or [place] Complete  SW Recovery Care/Counseling Consult Complete  [place] Not Applicable  Some recent data might be hidden

## 2021-08-26 NOTE — Progress Notes (Signed)
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  She was admitted to the hospital 38/10-17/5102 with metabolic encephalopathy related to an ESBL UTI treated with fosfomycin. This was following an admission in November 2022 for an ischemic CVA.     The patient was seen in her primary care physician's office 1/17 with symptoms of a UTI to include dysuria and progressive confusion.  She was dosed with ceftriaxone during the visit and sent home with Augmentin for empiric treatment.  Cultures obtained during that visit ultimately revealed ESBL E. coli.   Her family brought her to the [facility] ED because her confusion and lethargy were worsening, and she had developed nausea with vomiting related to the antibiotic she was prescribed as an outpatient.  She continued to have urinary frequency as well.  CT head in the ER was unrevealing.  Consultants:  None  Code Status: FULL CODE  Antimicrobials:  Meropenem 1/21 >  DVT prophylaxis: Lovenox  Interim Hx: Afebrile since admission.  Tachycardia resolved.  Blood pressure stable.  Renal function stable.  Tells me she is feeling much better today.  Tolerated breakfast without any difficulty and has not vomited today.  Is anxious to go home as soon as possible.  Assessment & Plan:  ESBL E coli UTI Continue directed abx tx -if continues to improve consider discontinuing antibiotics altogether after third dose of meropenem -alternatively could use Macrobid or Augmentin to extend treatment course but I am concerned about side effects with either drug   AKI on CKD stage IIIa Baseline creatinine reportedly approximately 1.3 -creatinine 1.82 at presentation -clinically most consistent with  prerenal azotemia - improved with hydration and has stabilized at approximately 1.6  Recent Labs  Lab 08/24/21 1432 08/25/21 0526 08/26/21 0518  CREATININE 1.82* 1.63* 1.64*     Sinus tachycardia Likely due to a combination of DH as well as inability to keep BB down -resolved with resumption of home beta-blocker and gentle hydration   Mild hyponatremia Clinically most consistent with hypovolemia - corrected with volume expansion   Uncontrolled HTN Blood pressure within target range with resumption of home medications  Hypertrophic cardiomyopathy well compensated presently    2-1 AV block with paroxysmal syncope Monitor on tele - is being worked up for pacer by EP as outpt    DM2 CBG well controlled   Family Communication: No family present at time of exam Disposition: From home -anticipate return home when able to tolerate oral intake and adequate antibiotic therapy completed (likely 1/24 AM)  Objective: Blood pressure (!) 148/74, pulse 92, temperature 98.5 F (36.9 C), temperature source Axillary, resp. rate 13, height 5' 5.5" (1.664 m), weight 54.1 kg, SpO2 95 %.  Intake/Output Summary (Last 24 hours) at 08/26/2021 1017 Last data filed at 08/26/2021 0858 Gross per 24 hour  Intake 2100.23 ml  Output 400 ml  Net 1700.23 ml    Filed Weights   08/25/21 0450  Weight: 54.1 kg    Examination: General: No acute respiratory distress Lungs: Clear to auscultation bilaterally - no wheezing  Cardiovascular: RRR - no M  Abdomen: NT/ND, soft, BS+, no mass  Extremities: No edema bilateral lower extremities  CBC: Recent Labs  Lab 08/24/21 1432 08/25/21 0526  WBC 9.8 7.6  NEUTROABS 6.4  --   HGB 13.7 11.2*  HCT 42.8 35.5*  MCV 84.6 85.3  PLT 489* 818    Basic Metabolic Panel: Recent Labs  Lab 08/24/21 1432 08/25/21 0526 08/26/21 0518  NA 134* 136 134*  K 4.3 3.6 3.4*  CL 105 105 105  CO2 14* 20* 21*  GLUCOSE 101* 88 89  BUN 29* 27* 25*  CREATININE 1.82*  1.63* 1.64*  CALCIUM 9.6 9.0 8.5*  MG  --  2.3  --   PHOS  --  3.8  --     GFR: Estimated Creatinine Clearance: 21.8 mL/min (A) (by C-G formula based on SCr of 1.64 mg/dL (H)).  Liver Function Tests: Recent Labs  Lab 08/24/21 1432 08/25/21 0526  AST 26 19  ALT 15 12  ALKPHOS 62 48  BILITOT 1.0 0.8  PROT 7.7 6.0*  ALBUMIN 4.3 3.3*    HbA1C: Hgb A1c MFr Bld  Date/Time Value Ref Range Status  07/12/2021 09:01 PM 5.8 (H) 4.8 - 5.6 % Final    Comment:    (NOTE) Pre diabetes:          5.7%-6.4%  Diabetes:              >6.4%  Glycemic control for   <7.0% adults with diabetes   05/28/2021 04:56 AM 6.2 (H) 4.8 - 5.6 % Final    Comment:    (NOTE) Pre diabetes:          5.7%-6.4%  Diabetes:              >6.4%  Glycemic control for   <7.0% adults with diabetes     CBG: Recent Labs  Lab 08/24/21 2136 08/25/21 1222 08/25/21 1712 08/25/21 2225 08/26/21 0801  GLUCAP 82 102* 88 128* 96     Recent Results (from the past 240 hour(s))  Urine Culture     Status: Abnormal   Collection Time: 08/20/21  4:54 PM   Specimen: Urine   UR  Result Value Ref Range Status   Urine Culture, Routine Final report (A)  Final   Organism ID, Bacteria Escherichia coli (A)  Final    Comment: Multi-Drug Resistant Organism Susceptibility profile is consistent with a probable ESBL. Greater than 100,000 colony forming units per mL    Antimicrobial Susceptibility Comment  Final    Comment:       ** S = Susceptible; I = Intermediate; R = Resistant **                    P = Positive; N = Negative             MICS are expressed in micrograms per mL    Antibiotic                 RSLT#1    RSLT#2    RSLT#3    RSLT#4 Amoxicillin/Clavulanic Acid    S Ampicillin                     R Cefazolin                      R Cefepime                       S Ceftriaxone                    R Cefuroxime  R Ciprofloxacin                  R Ertapenem                       S Gentamicin                     S Imipenem                       S Levofloxacin                   R Meropenem                      S Nitrofurantoin                 S Piperacillin/Tazobactam        S Tetracycline                   R Tobramycin                     S Trimethoprim/Sulfa             R   Novel Coronavirus, NAA ([facility])     Status: None   Collection Time: 08/20/21  4:56 PM   Specimen: Nasopharyngeal(NP) swabs in vial transport medium  Result Value Ref Range Status   SARS-CoV-2, NAA Not Detected Not Detected Final    Comment: This nucleic acid amplification test was developed and its performance characteristics determined by [employer], [employer] and [employer]. Nucleic acid amplification tests include RT-PCR and TMA. This test has not been FDA cleared or approved. This test has been authorized by FDA under an Emergency Use Authorization (EUA). This test is only authorized for the duration of time the declaration that circumstances exist justifying the authorization of the emergency use of in vitro diagnostic tests for detection of SARS-CoV-2 virus and/or diagnosis of COVID-19 infection under section 564(b)(1) of the Act, 21 U.S.C. 035WSF-6(C) (1), unless the authorization is terminated or revoked sooner. When diagnostic testing is negative, the possibility of a false negative result should be considered in the context of a patient's recent exposures and the presence of clinical signs and symptoms consistent with COVID-19. An individual without symptoms of COVID-19 and who is not shedding SARS-CoV-2 virus wo uld expect to have a negative (not detected) result in this assay.   SARS-COV-2, NAA 2 DAY TAT     Status: None   Collection Time: 08/20/21  4:56 PM  Result Value Ref Range Status   SARS-CoV-2, NAA 2 DAY TAT Performed  Final  Resp Panel by RT-PCR (Flu A&B, Covid) Nasopharyngeal Swab     Status: None   Collection Time: 08/24/21  3:56 PM   Specimen: Nasopharyngeal Swab;  Nasopharyngeal(NP) swabs in vial transport medium  Result Value Ref Range Status   SARS Coronavirus 2 by RT PCR NEGATIVE NEGATIVE Final    Comment: (NOTE) SARS-CoV-2 target nucleic acids are NOT DETECTED.  The SARS-CoV-2 RNA is generally detectable in upper respiratory specimens during the acute phase of infection. The lowest concentration of SARS-CoV-2 viral copies this assay can detect is 138 copies/mL. A negative result does not preclude SARS-Cov-2 infection and should not be used as the sole basis for treatment or other patient management decisions. A negative result may occur with  improper specimen collection/handling, submission of specimen other than nasopharyngeal swab, presence of  viral mutation(s) within the areas targeted by this assay, and inadequate number of viral copies(<138 copies/mL). A negative result must be combined with clinical observations, patient history, and epidemiological information. The expected result is Negative.  Fact Sheet for Patients:  EntrepreneurPulse.com.au  Fact Sheet for Healthcare Providers:  IncredibleEmployment.be  This test is no t yet approved or cleared by the [place] FDA and  has been authorized for detection and/or diagnosis of SARS-CoV-2 by FDA under an Emergency Use Authorization (EUA). This EUA will remain  in effect (meaning this test can be used) for the duration of the COVID-19 declaration under Section 564(b)(1) of the Act, 21 U.S.C.section 360bbb-3(b)(1), unless the authorization is terminated  or revoked sooner.       Influenza A by PCR NEGATIVE NEGATIVE Final   Influenza B by PCR NEGATIVE NEGATIVE Final    Comment: (NOTE) The Xpert Xpress SARS-CoV-2/FLU/RSV plus assay is intended as an aid in the diagnosis of influenza from Nasopharyngeal swab specimens and should not be used as a sole basis for treatment. Nasal washings and aspirates are unacceptable for Xpert Xpress  SARS-CoV-2/FLU/RSV testing.  Fact Sheet for Patients: EntrepreneurPulse.com.au  Fact Sheet for Healthcare Providers: IncredibleEmployment.be  This test is not yet approved or cleared by the [place] FDA and has been authorized for detection and/or diagnosis of SARS-CoV-2 by FDA under an Emergency Use Authorization (EUA). This EUA will remain in effect (meaning this test can be used) for the duration of the COVID-19 declaration under Section 564(b)(1) of the Act, 21 U.S.C. section 360bbb-3(b)(1), unless the authorization is terminated or revoked.  Performed at [facility], [place] [address] [place], [place] [address]       Scheduled Meds:  aspirin EC  81 mg Oral Daily   enoxaparin (LOVENOX) injection  30 mg Subcutaneous Q24H   feeding supplement  237 mL Oral BID BM   hydrALAZINE  100 mg Oral Q8H   insulin aspart  0-9 Units Subcutaneous TID WC   metoprolol tartrate  12.5 mg Oral BID   pantoprazole  40 mg Oral BID   saccharomyces boulardii  250 mg Oral BID   senna  1 tablet Oral BID   Continuous Infusions:  lactated ringers Stopped (08/26/21 0546)   meropenem (MERREM) IV Stopped (08/26/21 8841)     LOS: 2 days   Cherene Altes, MD [facility] Office  [phone] Pager - Text Page per Shea Evans  If 7PM-7AM, please contact night-coverage per Amion 08/26/2021, 10:17 AM

## 2021-08-27 ENCOUNTER — Ambulatory Visit: Payer: Self-pay

## 2021-08-27 DIAGNOSIS — N1831 Chronic kidney disease, stage 3a: Secondary | ICD-10-CM

## 2021-08-27 DIAGNOSIS — I10 Essential (primary) hypertension: Secondary | ICD-10-CM

## 2021-08-27 LAB — CBC
HCT: 29.8 % — ABNORMAL LOW (ref 36.0–46.0)
Hemoglobin: 9.4 g/dL — ABNORMAL LOW (ref 12.0–15.0)
MCH: 27.2 pg (ref 26.0–34.0)
MCHC: 31.5 g/dL (ref 30.0–36.0)
MCV: 86.4 fL (ref 80.0–100.0)
Platelets: 308 10*3/uL (ref 150–400)
RBC: 3.45 MIL/uL — ABNORMAL LOW (ref 3.87–5.11)
RDW: 15.3 % (ref 11.5–15.5)
WBC: 6.6 10*3/uL (ref 4.0–10.5)
nRBC: 0 % (ref 0.0–0.2)

## 2021-08-27 LAB — BASIC METABOLIC PANEL
Anion gap: 6 (ref 5–15)
BUN: 23 mg/dL (ref 8–23)
CO2: 21 mmol/L — ABNORMAL LOW (ref 22–32)
Calcium: 8.4 mg/dL — ABNORMAL LOW (ref 8.9–10.3)
Chloride: 107 mmol/L (ref 98–111)
Creatinine, Ser: 1.75 mg/dL — ABNORMAL HIGH (ref 0.44–1.00)
GFR, Estimated: 28 mL/min — ABNORMAL LOW (ref >60)
Glucose, Bld: 109 mg/dL — ABNORMAL HIGH (ref 70–99)
Potassium: 3.2 mmol/L — ABNORMAL LOW (ref 3.5–5.1)
Sodium: 134 mmol/L — ABNORMAL LOW (ref 135–145)

## 2021-08-27 LAB — MAGNESIUM: Magnesium: 2.2 mg/dL (ref 1.7–2.4)

## 2021-08-27 LAB — GLUCOSE, CAPILLARY
Glucose-Capillary: 109 mg/dL — ABNORMAL HIGH (ref 70–99)
Glucose-Capillary: 85 mg/dL (ref 70–99)
Glucose-Capillary: 99 mg/dL (ref 70–99)
Glucose-Capillary: 100 mg/dL — ABNORMAL HIGH (ref 70–99)

## 2021-08-27 NOTE — Consult Note (Signed)
[facility] Inpatient Consult   08/27/2021  Kahlan Engebretson [facility] [birth year] [phone]  [place] [facility] CM)  Patient chart reviewed due to high risk score for unplanned readmission. Assessed for post hospital chronic care needs. Patient is active with embedded chronic care management service at primary provider office.    Plan: Will continue to follow for progression.   Of note, [facility] Care Management services does not replace or interfere with any services that are arranged by inpatient case management or social work.   Netta Cedars, MSN, RN [facility] [employer] [phone]  Toll free office [phone]

## 2021-08-27 NOTE — Progress Notes (Addendum)
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  She was admitted to the hospital 14/78-29/5621 with metabolic encephalopathy related to an ESBL UTI treated with fosfomycin. This was following an admission in November 2022 for an ischemic CVA.     The patient was seen in her primary care physician's office 1/17 with symptoms of a UTI to include dysuria and progressive confusion.  She was dosed with ceftriaxone during the visit and sent home with Augmentin for empiric treatment.  Cultures obtained during that visit ultimately revealed ESBL E. coli.   Her family brought her to the [facility] ED because her confusion and lethargy were worsening, and she had developed nausea with vomiting related to the antibiotic she was prescribed as an outpatient.  She continued to have urinary frequency as well.  CT head in the ER was unrevealing.  Consultants:  None  Code Status: FULL CODE  Antimicrobials:  Meropenem 1/21 >  DVT prophylaxis: Lovenox  Interim Hx: Afebrile.  Vital signs stable.  Reports that her appetite is good today.  Denies nausea or vomiting.  Assessment & Plan:  ESBL E coli UTI Continue directed abx tx - given the lack of a reliable oral antibiotic and given her significant improvement with the use of meropenem we will complete a 5-day course of meropenem and then plan to discharge home if she continues to do well   AKI on CKD stage IIIa Baseline creatinine reportedly approximately 1.3 -creatinine 1.82 at presentation -clinically most consistent with prerenal azotemia -continue to monitor trend  Recent Labs  Lab 08/24/21 1432 08/25/21 0526 08/26/21 0518 08/27/21 0527  CREATININE 1.82* 1.63* 1.64* 1.75*      Sinus tachycardia Likely due to a combination of DH as well as inability to keep BB down -resolved with resumption of home beta-blocker and gentle hydration  Normocytic anemia Likely simply due to dilution/volume depletion -recheck in a.m.   Mild hyponatremia Clinically most consistent with hypovolemia - corrected with volume expansion   Uncontrolled HTN Blood pressure within target range with resumption of home medications  Hypertrophic cardiomyopathy well compensated presently    2-1 AV block with paroxysmal syncope Monitor on tele - is being worked up for pacer by EP as outpt    DM2 CBG well controlled  Metabolic encephalopathy Due to above - resolved at this time    Family Communication: No family present at time of exam Disposition: From home -anticipate return home when able to tolerate oral intake and 5 days of antibiotic therapy completed  Objective: Blood pressure (!) 146/83, pulse 88, temperature 98.1 F (36.7 C), temperature source Oral, resp. rate 18, height 5' 5.5" (1.664 m), weight 54.1 kg, SpO2 95 %.  Intake/Output Summary (Last 24 hours) at 08/27/2021 1003 Last data filed at 08/27/2021 0620 Gross per 24 hour  Intake 965.7 ml  Output 220 ml  Net 745.7 ml    Filed Weights   08/25/21 0450  Weight: 54.1 kg    Examination: General: No acute respiratory distress Lungs: Clear to auscultation bilaterally  Cardiovascular: RRR without murmur Abdomen: NT/ND, soft, BS+, no mass  Extremities: No edema bilateral lower extremities  CBC: Recent Labs  Lab 08/24/21 1432 08/25/21 0526 08/27/21 0527  WBC 9.8 7.6 6.6  NEUTROABS 6.4  --   --  HGB 13.7 11.2* 9.4*  HCT 42.8 35.5* 29.8*  MCV 84.6 85.3 86.4  PLT 489* 375 626    Basic Metabolic Panel: Recent Labs  Lab 08/25/21 0526 08/26/21 0518 08/27/21 0527  NA 136 134* 134*  K 3.6 3.4* 3.2*  CL 105 105 107  CO2 20* 21* 21*  GLUCOSE 88 89 109*  BUN 27* 25* 23  CREATININE 1.63* 1.64* 1.75*   CALCIUM 9.0 8.5* 8.4*  MG 2.3  --  2.2  PHOS 3.8  --   --     GFR: Estimated Creatinine Clearance: 20.4 mL/min (A) (by C-G formula based on SCr of 1.75 mg/dL (H)).  Liver Function Tests: Recent Labs  Lab 08/24/21 1432 08/25/21 0526  AST 26 19  ALT 15 12  ALKPHOS 62 48  BILITOT 1.0 0.8  PROT 7.7 6.0*  ALBUMIN 4.3 3.3*    HbA1C: Hgb A1c MFr Bld  Date/Time Value Ref Range Status  07/12/2021 09:01 PM 5.8 (H) 4.8 - 5.6 % Final    Comment:    (NOTE) Pre diabetes:          5.7%-6.4%  Diabetes:              >6.4%  Glycemic control for   <7.0% adults with diabetes   05/28/2021 04:56 AM 6.2 (H) 4.8 - 5.6 % Final    Comment:    (NOTE) Pre diabetes:          5.7%-6.4%  Diabetes:              >6.4%  Glycemic control for   <7.0% adults with diabetes     CBG: Recent Labs  Lab 08/26/21 0801 08/26/21 1212 08/26/21 1745 08/26/21 2154 08/27/21 0808  GLUCAP 96 100* 132* 102* 109*     Recent Results (from the past 240 hour(s))  Urine Culture     Status: Abnormal   Collection Time: 08/20/21  4:54 PM   Specimen: Urine   UR  Result Value Ref Range Status   Urine Culture, Routine Final report (A)  Final   Organism ID, Bacteria Escherichia coli (A)  Final    Comment: Multi-Drug Resistant Organism Susceptibility profile is consistent with a probable ESBL. Greater than 100,000 colony forming units per mL    Antimicrobial Susceptibility Comment  Final    Comment:       ** S = Susceptible; I = Intermediate; R = Resistant **                    P = Positive; N = Negative             MICS are expressed in micrograms per mL    Antibiotic                 RSLT#1    RSLT#2    RSLT#3    RSLT#4 Amoxicillin/Clavulanic Acid    S Ampicillin                     R Cefazolin                      R Cefepime                       S Ceftriaxone                    R Cefuroxime  R Ciprofloxacin                  R Ertapenem                      S Gentamicin                      S Imipenem                       S Levofloxacin                   R Meropenem                      S Nitrofurantoin                 S Piperacillin/Tazobactam        S Tetracycline                   R Tobramycin                     S Trimethoprim/Sulfa             R   Novel Coronavirus, NAA ([facility])     Status: None   Collection Time: 08/20/21  4:56 PM   Specimen: Nasopharyngeal(NP) swabs in vial transport medium  Result Value Ref Range Status   SARS-CoV-2, NAA Not Detected Not Detected Final    Comment: This nucleic acid amplification test was developed and its performance characteristics determined by [employer], [employer] and Company. Nucleic acid amplification tests include RT-PCR and TMA. This test has not been FDA cleared or approved. This test has been authorized by FDA under an Emergency Use Authorization (EUA). This test is only authorized for the duration of time the declaration that circumstances exist justifying the authorization of the emergency use of in vitro diagnostic tests for detection of SARS-CoV-2 virus and/or diagnosis of COVID-19 infection under section 564(b)(1) of the Act, 21 U.S.C. 154MGQ-6(P) (1), unless the authorization is terminated or revoked sooner. When diagnostic testing is negative, the possibility of a false negative result should be considered in the context of a patient's recent exposures and the presence of clinical signs and symptoms consistent with COVID-19. An individual without symptoms of COVID-19 and who is not shedding SARS-CoV-2 virus wo uld expect to have a negative (not detected) result in this assay.   SARS-COV-2, NAA 2 DAY TAT     Status: None   Collection Time: 08/20/21  4:56 PM  Result Value Ref Range Status   SARS-CoV-2, NAA 2 DAY TAT Performed  Final  Resp Panel by RT-PCR (Flu A&B, Covid) Nasopharyngeal Swab     Status: None   Collection Time: 08/24/21  3:56 PM   Specimen: Nasopharyngeal Swab; Nasopharyngeal(NP) swabs in  vial transport medium  Result Value Ref Range Status   SARS Coronavirus 2 by RT PCR NEGATIVE NEGATIVE Final    Comment: (NOTE) SARS-CoV-2 target nucleic acids are NOT DETECTED.  The SARS-CoV-2 RNA is generally detectable in upper respiratory specimens during the acute phase of infection. The lowest concentration of SARS-CoV-2 viral copies this assay can detect is 138 copies/mL. A negative result does not preclude SARS-Cov-2 infection and should not be used as the sole basis for treatment or other patient management decisions. A negative result may occur with  improper specimen collection/handling, submission of specimen other than nasopharyngeal swab, presence of  viral mutation(s) within the areas targeted by this assay, and inadequate number of viral copies(<138 copies/mL). A negative result must be combined with clinical observations, patient history, and epidemiological information. The expected result is Negative.  Fact Sheet for Patients:  EntrepreneurPulse.com.au  Fact Sheet for Healthcare Providers:  IncredibleEmployment.be  This test is no t yet approved or cleared by the [place] FDA and  has been authorized for detection and/or diagnosis of SARS-CoV-2 by FDA under an Emergency Use Authorization (EUA). This EUA will remain  in effect (meaning this test can be used) for the duration of the COVID-19 declaration under Section 564(b)(1) of the Act, 21 U.S.C.section 360bbb-3(b)(1), unless the authorization is terminated  or revoked sooner.       Influenza A by PCR NEGATIVE NEGATIVE Final   Influenza B by PCR NEGATIVE NEGATIVE Final    Comment: (NOTE) The Xpert Xpress SARS-CoV-2/FLU/RSV plus assay is intended as an aid in the diagnosis of influenza from Nasopharyngeal swab specimens and should not be used as a sole basis for treatment. Nasal washings and aspirates are unacceptable for Xpert Xpress SARS-CoV-2/FLU/RSV testing.  Fact  Sheet for Patients: EntrepreneurPulse.com.au  Fact Sheet for Healthcare Providers: IncredibleEmployment.be  This test is not yet approved or cleared by the [place] FDA and has been authorized for detection and/or diagnosis of SARS-CoV-2 by FDA under an Emergency Use Authorization (EUA). This EUA will remain in effect (meaning this test can be used) for the duration of the COVID-19 declaration under Section 564(b)(1) of the Act, 21 U.S.C. section 360bbb-3(b)(1), unless the authorization is terminated or revoked.  Performed at [facility], [place] [address] [place], [place] [address]       Scheduled Meds:  aspirin EC  81 mg Oral Daily   enoxaparin (LOVENOX) injection  30 mg Subcutaneous Q24H   feeding supplement ([employer] STANDARD 1.4)  325 mL Oral BID BM   hydrALAZINE  100 mg Oral Q8H   insulin aspart  0-9 Units Subcutaneous TID WC   metoprolol tartrate  12.5 mg Oral BID   multivitamin with minerals  1 tablet Oral Daily   pantoprazole  40 mg Oral BID   saccharomyces boulardii  250 mg Oral BID   senna  1 tablet Oral BID   Continuous Infusions:  meropenem (MERREM) IV Stopped (08/27/21 0555)     LOS: 3 days   Cherene Altes, MD [facility] Office  [phone] Pager - Text Page per Shea Evans  If 7PM-7AM, please contact night-coverage per Amion 08/27/2021, 10:03 AM

## 2021-08-27 NOTE — Patient Instructions (Signed)
Social Worker Visit Information  Goals we discussed today:  Continue to work with SW to obtain placement for long term care   Patient verbalizes understanding of instructions and care plan provided today and agrees to view in [place]. Active MyChart status confirmed with patient.    Follow Up Plan: SW will follow up with patient by phone over the next 2 days   Daneen Schick, BSW, CDP Social Worker, Certified Dementia Practitioner [place] [facility] [facility] [phone]

## 2021-08-27 NOTE — Chronic Care Management (AMB) (Signed)
°Chronic Care Management  ° ° Social Work Note ° °08/27/2021 °Name: Anna Hoffman MRN: [id number] DOB: 10/20/1934 ° °Anna Hoffman is a 87 y.o. year old female who is a primary care patient of Sanders, Robyn, MD. The CCM team was consulted to assist the patient with chronic disease management and/or care coordination needs related to:  HTN, DM II .  ° °Engaged with patients son by phone  for follow up visit in response to provider referral for social work chronic care management and care coordination services.  ° °Consent to Services:  °The patient was given information about Chronic Care Management services, agreed to services, and gave verbal consent prior to initiation of services.  Please see initial visit note for detailed documentation.  ° °Patient agreed to services and consent obtained.  ° °Assessment: Review of patient past medical history, allergies, medications, and health status, including review of relevant consultants reports was performed today as part of a comprehensive evaluation and provision of chronic care management and care coordination services.    ° °SDOH (Social Determinants of Health) assessments and interventions performed:   ° °Advanced Directives Status: Not addressed in this encounter. ° °CCM Care Plan ° °Allergies  °Allergen Reactions  ° Amoxicillin Diarrhea  °  Has patient had a PCN reaction causing immediate rash, facial/tongue/throat swelling, SOB or lightheadedness with hypotension: no °Has patient had a PCN reaction causing severe rash involving mucus membranes or skin necrosis: no °Has patient had a PCN reaction that required hospitalization: no pharmacist consult °Has patient had a PCN reaction occurring within the last 10 years: yes °If all of the above answers are "NO", then may proceed with Cephalosporin use. °  ° Cefepime Other (See Comments)  °  Possible CNS toxicity 05/2021  ° Ampicillin Rash  °  - Tolerates Rocephin and Ancef °- Remote occurrence; no symptoms of  anaphylaxis or severe cutaneous reaction, and no additional medical attention required ° °  ° Sulfa Antibiotics Rash  ° ° °Facility-Administered Encounter Medications as of 08/27/2021  °Medication  ° acetaminophen (TYLENOL) tablet 650 mg  ° aspirin EC tablet 81 mg  ° bisacodyl (DULCOLAX) suppository 10 mg  ° enoxaparin (LOVENOX) injection 30 mg  ° feeding supplement (KATE FARMS STANDARD 1.4) liquid 325 mL  ° hydrALAZINE (APRESOLINE) tablet 100 mg  ° insulin aspart (novoLOG) injection 0-9 Units  ° meropenem (MERREM) 1 g in sodium chloride 0.9 % 100 mL IVPB  ° metoprolol tartrate (LOPRESSOR) tablet 12.5 mg  ° multivitamin with minerals tablet 1 tablet  ° ondansetron (ZOFRAN) tablet 4 mg  ° Or  ° ondansetron (ZOFRAN) injection 4 mg  ° pantoprazole (PROTONIX) EC tablet 40 mg  ° polyethylene glycol (MIRALAX / GLYCOLAX) packet 17 g  ° saccharomyces boulardii (FLORASTOR) capsule 250 mg  ° senna (SENOKOT) tablet 8.6 mg  ° traMADol (ULTRAM) tablet 50 mg  ° °Outpatient Encounter Medications as of 08/27/2021  °Medication Sig Note  ° acetaminophen (TYLENOL) 325 MG tablet Take 650 mg by mouth every 6 (six) hours as needed for mild pain (Fever >/= 101). 08/25/2021: Med removed by accident.  ° amLODipine (NORVASC) 10 MG tablet Take 1 tablet (10 mg total) by mouth daily. (Patient not taking: Reported on 08/25/2021)   ° amoxicillin-clavulanate (AUGMENTIN) 500-125 MG tablet Take 1 tablet (500 mg total) by mouth every 12 (twelve) hours. (Patient not taking: Reported on 08/25/2021) 08/25/2021: Take for 7 days. Started 08/22/21. Patient states she took for a couple days until it made her   go to the bathroom excessively so she spoke with a pharmacist at [facility] who told her to stop taking it.  ° aspirin 81 MG EC tablet Take 1 tablet (81 mg total) by mouth daily. Swallow whole.   ° fexofenadine (ALLEGRA ALLERGY) 180 MG tablet Take 1 tablet (180 mg total) by mouth daily.   ° glucose blood test strip Use as directed to check blood sugars 1 time per  day.   ° glucose monitoring kit (FREESTYLE) monitoring kit Use as directed to check blood sugars 1 time per day dx: e11.22   ° hydrALAZINE (APRESOLINE) 100 MG tablet Take 1 tablet (100 mg total) by mouth every 8 (eight) hours.   ° insulin aspart (NOVOLOG) 100 UNIT/ML FlexPen Inject into the skin with meals per sliding scale. 151-200= 2 units, 201-250= 4 units, 251-300= 6 units, 301-350=8 units, 351-400=10 units, >401=10 units and notify MD (Patient not taking: Reported on 08/25/2021)   ° Lancets (ONETOUCH DELICA PLUS LANCET33G) MISC Use to check blood sugar once daily.   ° metoprolol tartrate (LOPRESSOR) 25 MG tablet Take 0.5 tablets (12.5 mg total) by mouth 2 (two) times daily.   ° pantoprazole (PROTONIX) 40 MG tablet Take 1 tablet (40 mg total) by mouth 2 (two) times daily. (Patient taking differently: Take 40 mg by mouth daily.)   ° rosuvastatin (CRESTOR) 20 MG tablet Take 1 tablet (20 mg total) by mouth daily.   ° saccharomyces boulardii (FLORASTOR) 250 MG capsule Take 1 capsule (250 mg total) by mouth 2 (two) times daily. (Patient not taking: Reported on 08/25/2021)   ° sucralfate (CARAFATE) 1 GM/10ML suspension Take 10 mLs (1 g total) by mouth 4 (four) times daily -  with meals and at bedtime for 28 days   ° ° °Patient Active Problem List  ° Diagnosis Date Noted  ° Protein-calorie malnutrition, severe 08/26/2021  ° Urinary tract infection 08/24/2021  ° Urinary tract infection due to extended-spectrum beta lactamase (ESBL) producing Escherichia coli 08/24/2021  ° Generalized weakness 07/12/2021  ° Acute metabolic encephalopathy   ° Malnutrition of moderate degree 05/24/2021  ° Sepsis ([name]) 05/23/2021  ° SIRS (systemic inflammatory response syndrome) ([name]) 05/22/2021  ° History of CVA (cerebrovascular accident) 05/22/2021  ° Gait instability 04/24/2021  ° Seasonal allergic rhinitis due to pollen 04/24/2021  ° Ascending aorta dilatation ([name]) 04/05/2021  ° HOCM (hypertrophic obstructive cardiomyopathy) ([name])  04/04/2021  ° Physical deconditioning 04/04/2021  ° Syncope 04/03/2021  ° Intractable vomiting with nausea 04/03/2021  ° Fall at home, initial encounter 03/31/2021  ° Thyroid nodule 03/31/2021  ° CAP (community acquired pneumonia) 11/15/2020  ° Dehydration 11/15/2020  ° Erosive esophagitis   ° Hiatal hernia   ° Hyponatremia 10/10/2020  ° Acute kidney injury superimposed on chronic kidney disease ([name]) 10/10/2020  ° Aortic atherosclerosis ([name]) 10/09/2020  ° Acute renal failure superimposed on stage 3a chronic kidney disease ([name]) 10/09/2020  ° Hyperkalemia 10/09/2020  ° Hearing loss 10/09/2020  ° Prolonged QT interval 10/09/2020  ° Colitis, acute 08/11/2019  ° Colitis 07/11/2019  ° Loose stools 06/07/2019  ° Herpes zoster without complication 02/21/2019  ° Other long term (current) drug therapy 08/12/2018  ° Chronic kidney disease, stage 3a ([name]) 07/19/2018  ° Hypertensive nephropathy 07/19/2018  ° Community acquired pneumonia of right upper lobe of lung 11/07/2017  ° Hypertensive emergency 07/01/2016  ° 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  ° 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[name])   ° Benign essential HTN   ° Type 2 diabetes mellitus with stage 3   chronic kidney disease, without long-term current use of insulin ([name])   ° Mixed hyperlipidemia   ° 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  06/27/2016  ° 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  ° Retroperitoneal bleed 02/08/2016  ° Generalized abdominal pain   ° Gastroenteritis 02/04/2016  ° Diarrhea 02/04/2016  ° Hypokalemia 02/04/2016  ° Incarcerated paraesophageal hernia 09/02/2014  ° Renal mass, left 09/02/2014  ° Acute esophagitis 09/02/2014  ° Gastric outlet obstruction 09/02/2014  ° Hypertension   ° Vomiting 09/01/2014  ° ° °Conditions to be addressed/monitored: HTN and DMII; Level of care concerns ° °Care Plan : Social Work Care Plan  °Updates made by ,  since 08/27/2021 12:00 AM  °  ° °Problem: Mobility and  Independence   °  ° °Goal: Mobility and Independence Optimized   °Start Date: 04/24/2021  °Expected End Date: 06/08/2021  °Recent Progress: On track  °Priority: High  °Note:   °Current Barriers:  °Chronic disease management support and education needs related to HTN and DM  °ADL IADL limitations and Limited access to caregiver ° °Social Worker Clinical Goal(s):  °patient will work with SW to identify and address any acute and/or chronic care coordination needs related to the self health management of HTN and DM  °patient will work with SW to address concerns related to level of care needs ° °SW Interventions:  °Inter-disciplinary care team collaboration (see longitudinal plan of care) °Collaboration with Sanders, Robyn, MD regarding development and update of comprehensive plan of care as evidenced by provider attestation and co-signature °Collaboration with Colleagues to obtain contact information for Sharon Barlow, Social Services Director °Secure email correspondence sent to Mrs. Barlow outlining barriers SW has faced to place patient surrounding inability to obtain approval for long term care Medicaid- received a response from Mrs. Barlow indicating she will have Pargie Turner-Horton, Division Director for Economic Services follow up with this writer °Completed call with patients son James who reports he also tried to contact Mrs. Barlow by phone and was informed his concern will be passed onto Sherry McLendon Program Manager °Discussed the patient will discharge from the hospital soon and is unable to safely stay in her home  °SW will follow up with James over the next two days ° °Patient Goals/Self-Care Activities °patient will: with the help of her son ° -  Review pros and cons for placement versus remaining in the home ° °Follow Up Plan:  SW will follow up with the patient over the next 2 days °  °  ° °Follow Up Plan: SW will follow up with patient by phone over the next 2 days. °     ° , BSW,  CDP °Social Worker, Certified Dementia Practitioner °[facility] °[phone] ° °   ° ° ° ° °

## 2021-08-27 NOTE — Progress Notes (Signed)
PT Cancellation Note  Patient Details Name: Shenetta Schnackenberg MRN: [id number] DOB: 06-01-1935   Cancelled Treatment:    Reason Eval/Treat Not Completed: Fatigue/lethargy limiting ability to participatePatient just go back into bed by RN. Will check back another time.    [name] 08/27/2021, 5:17 PM [place] Pager [phone] Office [phone]

## 2021-08-28 ENCOUNTER — Ambulatory Visit: Payer: Medicare Other | Admitting: Neurology

## 2021-08-28 ENCOUNTER — Other Ambulatory Visit: Payer: Self-pay | Admitting: Internal Medicine

## 2021-08-28 ENCOUNTER — Ambulatory Visit: Payer: Medicare Other

## 2021-08-28 DIAGNOSIS — I1310 Hypertensive heart and chronic kidney disease without heart failure, with stage 1 through stage 4 chronic kidney disease, or unspecified chronic kidney disease: Secondary | ICD-10-CM

## 2021-08-28 DIAGNOSIS — I10 Essential (primary) hypertension: Secondary | ICD-10-CM

## 2021-08-28 DIAGNOSIS — N1831 Chronic kidney disease, stage 3a: Secondary | ICD-10-CM

## 2021-08-28 DIAGNOSIS — E1122 Type 2 diabetes mellitus with diabetic chronic kidney disease: Secondary | ICD-10-CM

## 2021-08-28 LAB — BASIC METABOLIC PANEL
Anion gap: 6 (ref 5–15)
BUN: 22 mg/dL (ref 8–23)
CO2: 24 mmol/L (ref 22–32)
Calcium: 8.4 mg/dL — ABNORMAL LOW (ref 8.9–10.3)
Chloride: 106 mmol/L (ref 98–111)
Creatinine, Ser: 1.57 mg/dL — ABNORMAL HIGH (ref 0.44–1.00)
GFR, Estimated: 32 mL/min — ABNORMAL LOW (ref >60)
Glucose, Bld: 100 mg/dL — ABNORMAL HIGH (ref 70–99)
Potassium: 4 mmol/L (ref 3.5–5.1)
Sodium: 136 mmol/L (ref 135–145)

## 2021-08-28 LAB — CBC
HCT: 29.4 % — ABNORMAL LOW (ref 36.0–46.0)
Hemoglobin: 9.4 g/dL — ABNORMAL LOW (ref 12.0–15.0)
MCH: 27.4 pg (ref 26.0–34.0)
MCHC: 32 g/dL (ref 30.0–36.0)
MCV: 85.7 fL (ref 80.0–100.0)
Platelets: 310 10*3/uL (ref 150–400)
RBC: 3.43 MIL/uL — ABNORMAL LOW (ref 3.87–5.11)
RDW: 15 % (ref 11.5–15.5)
WBC: 6.2 10*3/uL (ref 4.0–10.5)
nRBC: 0 % (ref 0.0–0.2)

## 2021-08-28 LAB — GLUCOSE, CAPILLARY: Glucose-Capillary: 86 mg/dL (ref 70–99)

## 2021-08-28 NOTE — Chronic Care Management (AMB) (Signed)
Chronic Care Management    Social Work Note  08/28/2021 Name: Anna Hoffman MRN: [id number] DOB: 07-25-1935  Anna Hoffman is a 87 y.o. year old female who is a primary care patient of Glendale Chard, MD. The CCM team was consulted to assist the patient with chronic disease management and/or care coordination needs related to:  HTN, DM II, Level of Care concerns .   Engaged with patients son [name] by phone  for follow up visit in response to provider referral for social work chronic care management and care coordination services.   Consent to Services:  The patient was given information about Chronic Care Management services, agreed to services, and gave verbal consent prior to initiation of services.  Please see initial visit note for detailed documentation.   Patient agreed to services and consent obtained.   Assessment: Review of patient past medical history, allergies, medications, and health status, including review of relevant consultants reports was performed today as part of a comprehensive evaluation and provision of chronic care management and care coordination services.     SDOH (Social Determinants of Health) assessments and interventions performed:    Advanced Directives Status: Not addressed in this encounter.  CCM Care Plan  Allergies  Allergen Reactions   Amoxicillin Diarrhea    Has patient had a PCN reaction causing immediate rash, facial/tongue/throat swelling, SOB or lightheadedness with hypotension: no Has patient had a PCN reaction causing severe rash involving mucus membranes or skin necrosis: no Has patient had a PCN reaction that required hospitalization: no pharmacist consult Has patient had a PCN reaction occurring within the last 10 years: yes If all of the above answers are "NO", then may proceed with Cephalosporin use.    Cefepime Other (See Comments)    Possible CNS toxicity 05/2021   Ampicillin Rash    - Tolerates Rocephin and Ancef - Remote  occurrence; no symptoms of anaphylaxis or severe cutaneous reaction, and no additional medical attention required     Sulfa Antibiotics Rash    Outpatient Encounter Medications as of 08/28/2021  Medication Sig Note   acetaminophen (TYLENOL) 325 MG tablet Take 650 mg by mouth every 6 (six) hours as needed for mild pain (Fever >/= 101). 08/25/2021: Med removed by accident.   amLODipine (NORVASC) 10 MG tablet Take 1 tablet (10 mg total) by mouth daily. (Patient not taking: Reported on 08/25/2021)    aspirin 81 MG EC tablet Take 1 tablet (81 mg total) by mouth daily. Swallow whole.    fexofenadine (ALLEGRA ALLERGY) 180 MG tablet Take 1 tablet (180 mg total) by mouth daily.    glucose blood test strip Use as directed to check blood sugars 1 time per day.    glucose monitoring kit (FREESTYLE) monitoring kit Use as directed to check blood sugars 1 time per day dx: e11.22    insulin aspart (NOVOLOG) 100 UNIT/ML FlexPen Inject into the skin with meals per sliding scale. 151-200= 2 units, 201-250= 4 units, 251-300= 6 units, 301-350=8 units, 351-400=10 units, >401=10 units and notify MD (Patient not taking: Reported on 08/25/2021)    Lancets (ONETOUCH DELICA PLUS [id number]) MISC Use to check blood sugar once daily.    metoprolol tartrate (LOPRESSOR) 25 MG tablet Take 0.5 tablets (12.5 mg total) by mouth 2 (two) times daily.    pantoprazole (PROTONIX) 40 MG tablet Take 1 tablet (40 mg total) by mouth 2 (two) times daily. (Patient taking differently: Take 40 mg by mouth daily.)    rosuvastatin (CRESTOR) 20  MG tablet Take 1 tablet (20 mg total) by mouth daily.    saccharomyces boulardii (FLORASTOR) 250 MG capsule Take 1 capsule (250 mg total) by mouth 2 (two) times daily. (Patient not taking: Reported on 08/25/2021)    Facility-Administered Encounter Medications as of 08/28/2021  Medication   acetaminophen (TYLENOL) tablet 650 mg   aspirin EC tablet 81 mg   bisacodyl (DULCOLAX) suppository 10 mg    cyanocobalamin ((VITAMIN B-12)) injection 1,000 mcg   enoxaparin (LOVENOX) injection 30 mg   feeding supplement (KATE FARMS STANDARD 1.4) liquid 325 mL   hydrALAZINE (APRESOLINE) tablet 100 mg   insulin aspart (novoLOG) injection 0-9 Units   meropenem (MERREM) 1 g in sodium chloride 0.9 % 100 mL IVPB   metoprolol tartrate (LOPRESSOR) tablet 12.5 mg   multivitamin with minerals tablet 1 tablet   ondansetron (ZOFRAN) tablet 4 mg   Or   ondansetron (ZOFRAN) injection 4 mg   pantoprazole (PROTONIX) EC tablet 40 mg   polyethylene glycol (MIRALAX / GLYCOLAX) packet 17 g   saccharomyces boulardii (FLORASTOR) capsule 250 mg   senna (SENOKOT) tablet 8.6 mg   traMADol (ULTRAM) tablet 50 mg    Patient Active Problem List   Diagnosis Date Noted   Protein-calorie malnutrition, severe 08/26/2021   Urinary tract infection 08/24/2021   Urinary tract infection due to extended-spectrum beta lactamase (ESBL) producing Escherichia coli 08/24/2021   Generalized weakness 39/76/7341   Acute metabolic encephalopathy    Malnutrition of moderate degree 05/24/2021   Sepsis ([place]) 05/23/2021   SIRS (systemic inflammatory response syndrome) ([place]) 05/22/2021   History of CVA (cerebrovascular accident) 05/22/2021   Gait instability 04/24/2021   Seasonal allergic rhinitis due to pollen 04/24/2021   Ascending aorta dilatation ([place]) 04/05/2021   HOCM (hypertrophic obstructive cardiomyopathy) ([place]) 04/04/2021   Physical deconditioning 04/04/2021   Syncope 04/03/2021   Intractable vomiting with nausea 04/03/2021   Fall at home, initial encounter 03/31/2021   Thyroid nodule 03/31/2021   CAP (community acquired pneumonia) 11/15/2020   Dehydration 11/15/2020   Erosive esophagitis    Hiatal hernia    Hyponatremia 10/10/2020   Acute kidney injury superimposed on chronic kidney disease ([place]) 10/10/2020   Aortic atherosclerosis ([place]) 10/09/2020   Acute renal failure superimposed on stage 3a chronic kidney disease  ([place]) 10/09/2020   Hyperkalemia 10/09/2020   Hearing loss 10/09/2020   Prolonged QT interval 10/09/2020   Colitis, acute 08/11/2019   Colitis 07/11/2019   Loose stools 06/07/2019   Herpes zoster without complication 93/79/0240   Other long term (current) drug therapy 08/12/2018   Chronic kidney disease, stage 3a ([place]) 07/19/2018   Hypertensive nephropathy 07/19/2018   Community acquired pneumonia of right upper lobe of lung 11/07/2017   Hypertensive emergency 07/01/2016   Thalamic hemorrhage ([place]) 07/01/2016   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[place])    Benign essential HTN    Type 2 diabetes mellitus with stage 3 chronic kidney disease, without long-term current use of insulin ([place])    Mixed hyperlipidemia    ICH (intracerebral hemorrhage) ([place]) - hypertensive R thalamic hemorrhage  06/27/2016   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   Retroperitoneal bleed 02/08/2016   Generalized abdominal pain    Gastroenteritis 02/04/2016   Diarrhea 02/04/2016   Hypokalemia 02/04/2016   Incarcerated paraesophageal hernia 09/02/2014   Renal mass, left 09/02/2014   Acute esophagitis 09/02/2014   Gastric outlet obstruction 09/02/2014   Hypertension    Vomiting 09/01/2014    Conditions to be addressed/monitored: HTN and DMII; Level  of care concerns  Care Plan : Social Work Care Plan  Updates made by Daneen Schick since 08/28/2021 12:00 AM     Problem: Mobility and Independence      Goal: Mobility and Independence Optimized   Start Date: 04/24/2021  Expected End Date: 06/08/2021  Recent Progress: On track  Priority: High  Note:   Current Barriers:  Chronic disease management support and education needs related to HTN and DM  ADL IADL limitations and Limited access to caregiver  Social Worker Clinical Goal(s):  patient will work with SW to identify and address any acute and/or chronic care coordination needs related to the self health management of HTN and DM  patient will work with  SW to address concerns related to level of care needs  SW Interventions:  Inter-disciplinary care team collaboration (see longitudinal plan of care) Collaboration with Glendale Chard, MD regarding development and update of comprehensive plan of care as evidenced by provider attestation and co-signature Collaboration with [place] to discuss challenges with placing patient without LTC Medicaid approval Discussed placement case worker Gregor Hams with DSS will plan to assist with placing patient; Mrs. Marcelline Deist requests SW reach out to [facility] to request the patient be held until a SNF bed can be identified Collaboration with Marney Doctor, RN who advises the patient is medically stable for discharge and they cannot hold patient at this time Collaboration with Mrs. Caldwell with DSS to report the patient will be discharged today to her home with home health orders. Determined Mrs. Marcelline Deist will follow up with the patient and her son to assist with placement Successful outbound call placed to the patients son [name] to advise of plan for Mrs. Marcelline Deist to work on placing the patient in hopes of overcoming barriers this SW has encountered Collaboration with Smitty Knudsen to advise of SW interventions and plan  Patient Goals/Self-Care Activities patient will: with the help of her son  -  Work with DSS placement worker Mrs. Caldwell to obtain placement  Follow Up Plan:  SW will follow up with the patient over the next 14 days      Follow Up Plan: SW will follow up with patient by phone over the next 14 days.      Daneen Schick, BSW, CDP Social Worker, Certified Dementia Practitioner [facility] [facility] [phone]

## 2021-08-28 NOTE — Patient Instructions (Signed)
Social Worker Visit Information  Goals we discussed today:   Goals Addressed             This Visit's Progress    Mobility and Independence Optimized       Timeframe:  Short-Term Goal Priority:  High Start Date:  9.21.22                                             Patient Goals/Self-Care Activities patient will: with the help of her son  - Work with DSS placement worker Mrs. Caldwell to obtain placement           Patient verbalizes understanding of instructions and care plan provided today and agrees to view in [place]. Active MyChart status confirmed with patient.    Follow Up Plan: SW will follow up with patient by phone over the next 14 days   Daneen Schick, BSW, CDP Social Worker, Certified Dementia Practitioner [place] [facility] [phone]

## 2021-08-28 NOTE — Discharge Summary (Signed)
Physician Discharge Summary  Anna Hoffman [facility] MPN:[id number] DOB: [birth year] DOA: 08/24/2021  PCP: Glendale Chard, MD  Admit date: 08/24/2021 Discharge date: 08/28/2021  Admitted From: home Disposition:  home  Recommendations for Outpatient Follow-up:  Follow up with PCP in 1-2 weeks Please obtain BMP/CBC in one week   Home Health:Yes Equipment/Devices:none  Discharge Condition:Stable CODE STATUS:Full Diet recommendation: Heart Healthy   Brief/Interim Summary: 87 year old with hypertrophic cardiomyopathy, small vessel ischemic disease, CVA, essential hypertension diabetes mellitus type 2 ascending aortic aneurysm, recurrent 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 coli  Discharge Diagnoses:  Principal Problem:   Urinary tract infection Active Problems:   Intractable vomiting with nausea   Urinary tract infection due to extended-spectrum beta lactamase (ESBL) producing Escherichia coli   Protein-calorie malnutrition, severe  E. coli UTI: She was started on IV antibiotics, right urine cultures grew back E. coli she completed a 5-day course of meropenem.  Acute kidney injury on chronic kidney stage IIIa: With a baseline creatinine around 1.3 on admission 1.8 likely prerenal azotemia resolved with IV fluid hydration.  Sinus tachycardia: Resolved with resumption of beta-blockers.  Normocytic anemia: Mild drop in hemoglobin compared to previous likely hemoconcentration her hemoglobin drop to baseline after IV fluid hydration.  Mild hyponatremia: Likely due to hypovolemic resolved with volume expansion.  Essential hypertension: No changes made to her antihypertensive medication.  Hypertrophic cardiomyopathy: Currently compensated.  Due to 1  AV block with paroxysmal syncope: Will need to follow-up with EP as an outpatient for which she has an appointment for possible pacemaker.  Diabetes mellitus type 2: Well-controlled no changes made.  Metabolic encephalopathy: Likely due to above now resolved.  Discharge Instructions  Discharge Instructions     Diet - low sodium heart healthy   Complete by: As directed    Increase activity slowly   Complete by: As directed       Allergies as of 08/28/2021       Reactions   Amoxicillin Diarrhea   Has patient had a PCN reaction causing immediate rash, facial/tongue/throat swelling, SOB or lightheadedness with hypotension: no Has patient had a PCN reaction causing severe rash involving mucus membranes or skin necrosis: no Has patient had a PCN reaction that required hospitalization: no pharmacist consult Has patient had a PCN reaction occurring within the last 10 years: yes If all of the above answers are "NO", then may proceed with Cephalosporin use.   Cefepime Other (See Comments)   Possible CNS toxicity 05/2021   Ampicillin Rash   - Tolerates Rocephin and Ancef - Remote occurrence; no symptoms of anaphylaxis or severe cutaneous reaction, and no additional medical attention required   Sulfa Antibiotics Rash        Medication List     STOP taking these medications    amoxicillin-clavulanate 500-125 MG tablet Commonly known as: Augmentin   hydrALAZINE 100 MG tablet Commonly known as: APRESOLINE   sucralfate 1 GM/10ML suspension Commonly known as: CARAFATE       TAKE these medications    acetaminophen 325 MG tablet Commonly known as: TYLENOL Take 650 mg by mouth every 6 (six) hours as needed for mild pain (Fever >/= 101).   amLODipine 10 MG tablet Commonly known as: NORVASC Take 1 tablet (10 mg total) by mouth  daily.   aspirin 81 MG EC tablet Take 1 tablet (81 mg total) by mouth daily. Swallow whole.   fexofenadine 180 MG tablet Commonly known as:  Allegra Allergy Take 1 tablet (180 mg total) by mouth daily.   glucose monitoring kit monitoring kit Use as directed to check blood sugars 1 time per day dx: e11.22   metoprolol tartrate 25 MG tablet Commonly known as: LOPRESSOR Take 0.5 tablets (12.5 mg total) by mouth 2 (two) times daily.   NovoLOG FlexPen 100 UNIT/ML FlexPen Generic drug: insulin aspart Inject into the skin with meals per sliding scale. 151-200= 2 units, 201-250= 4 units, 251-300= 6 units, 301-350=8 units, 351-400=10 units, >401=10 units and notify MD   OneTouch Delica Plus [id number] Misc Use to check blood sugar once daily.   OneTouch Verio test strip Generic drug: glucose blood Use as directed to check blood sugars 1 time per day.   pantoprazole 40 MG tablet Commonly known as: PROTONIX Take 1 tablet (40 mg total) by mouth 2 (two) times daily. What changed: when to take this   rosuvastatin 20 MG tablet Commonly known as: CRESTOR Take 1 tablet (20 mg total) by mouth daily.   saccharomyces boulardii 250 MG capsule Commonly known as: Florastor Take 1 capsule (250 mg total) by mouth 2 (two) times daily.        Allergies  Allergen Reactions   Amoxicillin Diarrhea    Has patient had a PCN reaction causing immediate rash, facial/tongue/throat swelling, SOB or lightheadedness with hypotension: no Has patient had a PCN reaction causing severe rash involving mucus membranes or skin necrosis: no Has patient had a PCN reaction that required hospitalization: no pharmacist consult Has patient had a PCN reaction occurring within the last 10 years: yes If all of the above answers are "NO", then may proceed with Cephalosporin use.    Cefepime Other (See Comments)    Possible CNS toxicity 05/2021   Ampicillin Rash    - Tolerates Rocephin and Ancef - Remote occurrence; no symptoms of anaphylaxis or severe cutaneous reaction, and no additional medical attention required     Sulfa Antibiotics Rash     Consultations: None  Procedures/Studies: CT Head Wo Contrast  Result Date: 08/24/2021 CLINICAL DATA:  Mental status change EXAM: CT HEAD WITHOUT CONTRAST TECHNIQUE: Contiguous axial images were obtained from the base of the skull through the vertex without intravenous contrast. RADIATION DOSE REDUCTION: This exam was performed according to the departmental dose-optimization program which includes automated exposure control, adjustment of the mA and/or kV according to patient size and/or use of iterative reconstruction technique. COMPARISON:  MRI 07/12/2021, CT brain 07/11/2021 FINDINGS: Brain: No acute territorial infarction, hemorrhage, or intracranial mass. Moderate atrophy. Fairly extensive white matter hypodensity consistent with chronic small vessel ischemic change. Small chronic infarct in the left cerebellum. Stable ventricle size. Vascular: No hyperdense vessels.  Carotid vascular calcification Skull: Normal. Negative for fracture or focal lesion. Sinuses/Orbits: No acute finding. Other: None IMPRESSION: 1. No CT evidence for acute intracranial abnormality. 2. Atrophy and chronic small vessel ischemic changes of the white matter. Electronically Signed   By: Donavan Foil M.D.   On: 08/24/2021 15:36   DG Chest Port 1 View  Result Date: 08/24/2021 CLINICAL DATA:  Cough. EXAM: PORTABLE CHEST 1 VIEW COMPARISON:  07/12/2021 FINDINGS: The lungs are clear without focal pneumonia, edema, pneumothorax or pleural effusion. The cardiopericardial silhouette is within normal limits for size. The visualized bony structures of the thorax show no acute abnormality. Telemetry  leads overlie the chest. IMPRESSION: No active disease. Electronically Signed   By: Misty Stanley M.D.   On: 08/24/2021 16:17   (Echo, Carotid, EGD, Colonoscopy, ERCP)    Subjective: No complaints  Discharge Exam: Vitals:   08/27/21 2029 08/28/21 0413  BP: 133/69 130/86  Pulse: 85 78  Resp: 18 18  Temp: 98.4 F (36.9 C)  98.5 F (36.9 C)  SpO2: 96% 96%   Vitals:   08/27/21 0444 08/27/21 1428 08/27/21 2029 08/28/21 0413  BP: (!) 146/83 138/75 133/69 130/86  Pulse: 88 82 85 78  Resp: _0 Temp: 98.1 F (36.7 C) 97.8 F (36.6 C) 98.4 F (36.9 C) 98.5 F (36.9 C)  TempSrc: Oral Oral Oral Oral  SpO2: 95%  96% 96%  Weight:      Height:        General: Pt is alert, awake, not in acute distress Cardiovascular: RRR, S1/S2 +, no rubs, no gallops Respiratory: CTA bilaterally, no wheezing, no rhonchi Abdominal: Soft, NT, ND, bowel sounds + Extremities: no edema, no cyanosis    The results of significant diagnostics from this hospitalization (including imaging, microbiology, ancillary and laboratory) are listed below for reference.     Microbiology: Recent Results (from the past 240 hour(s))  Urine Culture     Status: Abnormal   Collection Time: 08/20/21  4:54 PM   Specimen: Urine   UR  Result Value Ref Range Status   Urine Culture, Routine Final report (A)  Final   Organism ID, Bacteria Escherichia coli (A)  Final    Comment: Multi-Drug Resistant Organism Susceptibility profile is consistent with a probable ESBL. Greater than 100,000 colony forming units per mL    Antimicrobial Susceptibility Comment  Final    Comment:       ** S = Susceptible; I = Intermediate; R = Resistant **                    P = Positive; N = Negative             MICS are expressed in micrograms per mL    Antibiotic                 RSLT#1    RSLT#2    RSLT#3    RSLT#4 Amoxicillin/Clavulanic Acid    S Ampicillin                     R Cefazolin                      R Cefepime                       S Ceftriaxone                    R Cefuroxime                     R Ciprofloxacin                  R Ertapenem                      S Gentamicin                     S Imipenem  S Levofloxacin                   R Meropenem                      S Nitrofurantoin                  S Piperacillin/Tazobactam        S Tetracycline                   R Tobramycin                     S Trimethoprim/Sulfa             R   Novel Coronavirus, NAA ([facility])     Status: None   Collection Time: 08/20/21  4:56 PM   Specimen: Nasopharyngeal(NP) swabs in vial transport medium  Result Value Ref Range Status   SARS-CoV-2, NAA Not Detected Not Detected Final    Comment: This nucleic acid amplification test was developed and its performance characteristics determined by [employer], [employer] and [employer]. Nucleic acid amplification tests include RT-PCR and TMA. This test has not been FDA cleared or approved. This test has been authorized by FDA under an Emergency Use Authorization (EUA). This test is only authorized for the duration of time the declaration that circumstances exist justifying the authorization of the emergency use of in vitro diagnostic tests for detection of SARS-CoV-2 virus and/or diagnosis of COVID-19 infection under section 564(b)(1) of the Act, 21 U.S.C. 683MHD-6(Q) (1), unless the authorization is terminated or revoked sooner. When diagnostic testing is negative, the possibility of a false negative result should be considered in the context of a patient's recent exposures and the presence of clinical signs and symptoms consistent with COVID-19. An individual without symptoms of COVID-19 and who is not shedding SARS-CoV-2 virus wo uld expect to have a negative (not detected) result in this assay.   SARS-COV-2, NAA 2 DAY TAT     Status: None   Collection Time: 08/20/21  4:56 PM  Result Value Ref Range Status   SARS-CoV-2, NAA 2 DAY TAT Performed  Final  Resp Panel by RT-PCR (Flu A&B, Covid) Nasopharyngeal Swab     Status: None   Collection Time: 08/24/21  3:56 PM   Specimen: Nasopharyngeal Swab; Nasopharyngeal(NP) swabs in vial transport medium  Result Value Ref Range Status   SARS Coronavirus 2 by RT PCR NEGATIVE NEGATIVE Final    Comment: (NOTE) SARS-CoV-2 target  nucleic acids are NOT DETECTED.  The SARS-CoV-2 RNA is generally detectable in upper respiratory specimens during the acute phase of infection. The lowest concentration of SARS-CoV-2 viral copies this assay can detect is 138 copies/mL. A negative result does not preclude SARS-Cov-2 infection and should not be used as the sole basis for treatment or other patient management decisions. A negative result may occur with  improper specimen collection/handling, submission of specimen other than nasopharyngeal swab, presence of viral mutation(s) within the areas targeted by this assay, and inadequate number of viral copies(<138 copies/mL). A negative result must be combined with clinical observations, patient history, and epidemiological information. The expected result is Negative.  Fact Sheet for Patients:  EntrepreneurPulse.com.au  Fact Sheet for Healthcare Providers:  IncredibleEmployment.be  This test is no t yet approved or cleared by the [place] FDA and  has been authorized for detection and/or diagnosis of SARS-CoV-2 by FDA under an Emergency Use Authorization (EUA). This EUA will remain  in effect (meaning this test can be used) for the duration of the COVID-19 declaration under Section 564(b)(1) of the Act, 21 U.S.C.section 360bbb-3(b)(1), unless the authorization is terminated  or revoked sooner.       Influenza A by PCR NEGATIVE NEGATIVE Final   Influenza B by PCR NEGATIVE NEGATIVE Final    Comment: (NOTE) The Xpert Xpress SARS-CoV-2/FLU/RSV plus assay is intended as an aid in the diagnosis of influenza from Nasopharyngeal swab specimens and should not be used as a sole basis for treatment. Nasal washings and aspirates are unacceptable for Xpert Xpress SARS-CoV-2/FLU/RSV testing.  Fact Sheet for Patients: EntrepreneurPulse.com.au  Fact Sheet for Healthcare  Providers: IncredibleEmployment.be  This test is not yet approved or cleared by the [place] FDA and has been authorized for detection and/or diagnosis of SARS-CoV-2 by FDA under an Emergency Use Authorization (EUA). This EUA will remain in effect (meaning this test can be used) for the duration of the COVID-19 declaration under Section 564(b)(1) of the Act, 21 U.S.C. section 360bbb-3(b)(1), unless the authorization is terminated or revoked.  Performed at [facility], [place] [address], [place], [place] [address]      Labs: BNP (last 3 results) No results for input(s): BNP in the last 8760 hours. Basic Metabolic Panel: Recent Labs  Lab 08/24/21 1432 08/25/21 0526 08/26/21 0518 08/27/21 0527 08/28/21 0509  NA 134* 136 134* 134* 136  K 4.3 3.6 3.4* 3.2* 4.0  CL 105 105 105 107 106  CO2 14* 20* 21* 21* 24  GLUCOSE 101* 88 89 109* 100*  BUN 29* 27* 25* 23 22  CREATININE 1.82* 1.63* 1.64* 1.75* 1.57*  CALCIUM 9.6 9.0 8.5* 8.4* 8.4*  MG  --  2.3  --  2.2  --   PHOS  --  3.8  --   --   --    Liver Function Tests: Recent Labs  Lab 08/24/21 1432 08/25/21 0526  AST 26 19  ALT 15 12  ALKPHOS 62 48  BILITOT 1.0 0.8  PROT 7.7 6.0*  ALBUMIN 4.3 3.3*   No results for input(s): LIPASE, AMYLASE in the last 168 hours. No results for input(s): AMMONIA in the last 168 hours. CBC: Recent Labs  Lab 08/24/21 1432 08/25/21 0526 08/27/21 0527 08/28/21 0509  WBC 9.8 7.6 6.6 6.2  NEUTROABS 6.4  --   --   --   HGB 13.7 11.2* 9.4* 9.4*  HCT 42.8 35.5* 29.8* 29.4*  MCV 84.6 85.3 86.4 85.7  PLT 489* 375 308 310   Cardiac Enzymes: No results for input(s): CKTOTAL, CKMB, CKMBINDEX, TROPONINI in the last 168 hours. BNP: Invalid input(s): POCBNP CBG: Recent Labs  Lab 08/27/21 0808 08/27/21 1225 08/27/21 1620 08/27/21 2033 08/28/21 0728  GLUCAP 109* 85 99 100* 86   D-Dimer No results for input(s): DDIMER in the last 72 hours. Hgb  A1c No results for input(s): HGBA1C in the last 72 hours. Lipid Profile No results for input(s): CHOL, HDL, LDLCALC, TRIG, CHOLHDL, LDLDIRECT in the last 72 hours. Thyroid function studies No results for input(s): TSH, T4TOTAL, T3FREE, THYROIDAB in the last 72 hours.  Invalid input(s): FREET3 Anemia work up No results for input(s): VITAMINB12, FOLATE, FERRITIN, TIBC, IRON, RETICCTPCT in the last 72 hours. Urinalysis    Component Value Date/Time   COLORURINE YELLOW 08/24/2021 [place] 08/24/2021 1442   LABSPEC 1.015 08/24/2021 1442   PHURINE 5.0 08/24/2021 1442   GLUCOSEU NEGATIVE 08/24/2021 1442   HGBUR NEGATIVE 08/24/2021  [place] 08/24/2021 1442   BILIRUBINUR negative 08/20/2021 1650   KETONESUR 5 (A) 08/24/2021 1442   PROTEINUR 30 (A) 08/24/2021 1442   UROBILINOGEN 0.2 08/20/2021 1650   UROBILINOGEN 0.2 09/01/2014 0300   NITRITE NEGATIVE 08/24/2021 1442   LEUKOCYTESUR SMALL (A) 08/24/2021 1442   Sepsis Labs Invalid input(s): PROCALCITONIN,  WBC,  LACTICIDVEN Microbiology Recent Results (from the past 240 hour(s))  Urine Culture     Status: Abnormal   Collection Time: 08/20/21  4:54 PM   Specimen: Urine   UR  Result Value Ref Range Status   Urine Culture, Routine Final report (A)  Final   Organism ID, Bacteria Escherichia coli (A)  Final    Comment: Multi-Drug Resistant Organism Susceptibility profile is consistent with a probable ESBL. Greater than 100,000 colony forming units per mL    Antimicrobial Susceptibility Comment  Final    Comment:       ** S = Susceptible; I = Intermediate; R = Resistant **                    P = Positive; N = Negative             MICS are expressed in micrograms per mL    Antibiotic                 RSLT#1    RSLT#2    RSLT#3    RSLT#4 Amoxicillin/Clavulanic Acid    S Ampicillin                     R Cefazolin                      R Cefepime                       S Ceftriaxone                     R Cefuroxime                     R Ciprofloxacin                  R Ertapenem                      S Gentamicin                     S Imipenem                       S Levofloxacin                   R Meropenem                      S Nitrofurantoin                 S Piperacillin/Tazobactam        S Tetracycline                   R Tobramycin                     S Trimethoprim/Sulfa             R   Novel Coronavirus, NAA ([facility])     Status: None   Collection Time: 08/20/21  4:56 PM   Specimen: Nasopharyngeal(NP) swabs in vial transport medium  Result Value Ref Range Status   SARS-CoV-2, NAA Not Detected Not Detected Final    Comment: This nucleic acid amplification test was developed and its performance characteristics determined by [employer], [employer] and [employer]. Nucleic acid amplification tests include RT-PCR and TMA. This test has not been FDA cleared or approved. This test has been authorized by FDA under an Emergency Use Authorization (EUA). This test is only authorized for the duration of time the declaration that circumstances exist justifying the authorization of the emergency use of in vitro diagnostic tests for detection of SARS-CoV-2 virus and/or diagnosis of COVID-19 infection under section 564(b)(1) of the Act, 21 U.S.C. 952WUX-3(K) (1), unless the authorization is terminated or revoked sooner. When diagnostic testing is negative, the possibility of a false negative result should be considered in the context of a patient's recent exposures and the presence of clinical signs and symptoms consistent with COVID-19. An individual without symptoms of COVID-19 and who is not shedding SARS-CoV-2 virus wo uld expect to have a negative (not detected) result in this assay.   SARS-COV-2, NAA 2 DAY TAT     Status: None   Collection Time: 08/20/21  4:56 PM  Result Value Ref Range Status   SARS-CoV-2, NAA 2 DAY TAT Performed  Final  Resp Panel by RT-PCR (Flu A&B, Covid) Nasopharyngeal  Swab     Status: None   Collection Time: 08/24/21  3:56 PM   Specimen: Nasopharyngeal Swab; Nasopharyngeal(NP) swabs in vial transport medium  Result Value Ref Range Status   SARS Coronavirus 2 by RT PCR NEGATIVE NEGATIVE Final    Comment: (NOTE) SARS-CoV-2 target nucleic acids are NOT DETECTED.  The SARS-CoV-2 RNA is generally detectable in upper respiratory specimens during the acute phase of infection. The lowest concentration of SARS-CoV-2 viral copies this assay can detect is 138 copies/mL. A negative result does not preclude SARS-Cov-2 infection and should not be used as the sole basis for treatment or other patient management decisions. A negative result may occur with  improper specimen collection/handling, submission of specimen other than nasopharyngeal swab, presence of viral mutation(s) within the areas targeted by this assay, and inadequate number of viral copies(<138 copies/mL). A negative result must be combined with clinical observations, patient history, and epidemiological information. The expected result is Negative.  Fact Sheet for Patients:  EntrepreneurPulse.com.au  Fact Sheet for Healthcare Providers:  IncredibleEmployment.be  This test is no t yet approved or cleared by the [place] FDA and  has been authorized for detection and/or diagnosis of SARS-CoV-2 by FDA under an Emergency Use Authorization (EUA). This EUA will remain  in effect (meaning this test can be used) for the duration of the COVID-19 declaration under Section 564(b)(1) of the Act, 21 U.S.C.section 360bbb-3(b)(1), unless the authorization is terminated  or revoked sooner.       Influenza A by PCR NEGATIVE NEGATIVE Final   Influenza B by PCR NEGATIVE NEGATIVE Final    Comment: (NOTE) The Xpert Xpress SARS-CoV-2/FLU/RSV plus assay is intended as an aid in the diagnosis of influenza from Nasopharyngeal swab specimens and should not be used as a sole  basis for treatment. Nasal washings and aspirates are unacceptable for Xpert Xpress SARS-CoV-2/FLU/RSV testing.  Fact Sheet for Patients: EntrepreneurPulse.com.au  Fact Sheet for Healthcare Providers: IncredibleEmployment.be  This test is not yet approved or cleared by the [place] FDA and has been authorized for detection and/or diagnosis of SARS-CoV-2 by FDA under an Emergency  Use Authorization (EUA). This EUA will remain in effect (meaning this test can be used) for the duration of the COVID-19 declaration under Section 564(b)(1) of the Act, 21 U.S.C. section 360bbb-3(b)(1), unless the authorization is terminated or revoked.  Performed at [facility], [place] [address], [place], [place] [address]       SIGNED:   Charlynne Cousins, MD  [facility] 08/28/2021, 1:18 PM Pager   If 7PM-7AM, please contact night-coverage [email or web address] Password [email or web address]

## 2021-08-28 NOTE — Progress Notes (Signed)
Occupational Therapy Treatment Patient Details Name: Anna Hoffman MRN: [id number] DOB: 05/30/1935 Today's Date: 08/28/2021   History of present illness patient is a 87 year old female who presented to the hosptial with increased confusion and lethargy. patient was admitted with UTI. PMH: esophagitis, CKD III, colitis, hypertrophic cardiomyopathy, intracerebral hemorrhage, diabetes.   OT comments  Patient +1 assist today needing mod A to power up to standing from edge of bed and low toilet with cues for technique. Patient min G once standing and able to perform grooming/hygiene tasks at sink and peri care after voiding without loss of balance. Patient progressing towards acute OT goals.    Recommendations for follow up therapy are one component of a multi-disciplinary discharge planning process, led by the attending physician.  Recommendations may be updated based on patient status, additional functional criteria and insurance authorization.    Follow Up Recommendations  Home health OT    Assistance Recommended at Discharge Frequent or constant Supervision/Assistance  Patient can return home with the following  A little help with walking and/or transfers;A little help with bathing/dressing/bathroom   Equipment Recommendations  None recommended by OT       Precautions / Restrictions Precautions Precautions: Fall Restrictions Weight Bearing Restrictions: No       Mobility Bed Mobility Overal bed mobility: Needs Assistance Bed Mobility: Supine to Sit     Supine to sit: Min assist     General bed mobility comments: Assist to upright trunk    Transfers                         Balance Overall balance assessment: Needs assistance Sitting-balance support: No upper extremity supported, Feet supported Sitting balance-Leahy Scale: Fair     Standing balance support: Reliant on assistive device for balance Standing balance-Leahy Scale: Poor                              ADL either performed or assessed with clinical judgement   ADL Overall ADL's : Needs assistance/impaired     Grooming: Oral care;Wash/dry face;Wash/dry hands;Supervision/safety;Standing                   Armed forces technical officer: Moderate assistance;Cueing for safety;Cueing for sequencing;Ambulation;Rolling walker (2 wheels) Toilet Transfer Details (indicate cue type and reason): Patient needing mod A x1 to power up to standing from toilet with use of grab bar and cues for technique. Min G ambulating with walker to/from bathroom Toileting- Clothing Manipulation and Hygiene: Min guard;Sit to/from stand Toileting - Clothing Manipulation Details (indicate cue type and reason): Once standing patient able to maintain balance in order to perform peri care after voiding     Functional mobility during ADLs: Min guard;Rolling walker (2 wheels);Cueing for safety;Cueing for sequencing        Cognition Arousal/Alertness: Awake/alert Behavior During Therapy: WFL for tasks assessed/performed Overall Cognitive Status: Within Functional Limits for tasks assessed                                                     Pertinent Vitals/ Pain       Pain Assessment Pain Assessment: Faces Faces Pain Scale: Hurts little more Pain Location: R shoulder and knees Pain Descriptors / Indicators: Grimacing, Discomfort Pain Intervention(s): Monitored  during session         Frequency  Min 2X/week        Progress Toward Goals  OT Goals(current goals can now be found in the care plan section)  Progress towards OT goals: Progressing toward goals  Acute Rehab OT Goals Patient Stated Goal: Home with son to assist OT Goal Formulation: With patient Time For Goal Achievement: 09/08/21 Potential to Achieve Goals: Good ADL Goals Pt Will Perform Lower Body Dressing: with supervision;sit to/from stand;sitting/lateral leans Pt Will Transfer to Toilet: with  supervision;ambulating;regular height toilet Pt Will Perform Toileting - Clothing Manipulation and hygiene: with supervision;sit to/from stand;sitting/lateral leans Additional ADL Goal #1: Patient will stand at sink to perform grooming task as evidence of improving activity tolerance  Plan Discharge plan remains appropriate       AM-PAC OT "6 Clicks" Daily Activity     Outcome Measure   Help from another person eating meals?: A Little Help from another person taking care of personal grooming?: A Little Help from another person toileting, which includes using toliet, bedpan, or urinal?: A Lot Help from another person bathing (including washing, rinsing, drying)?: A Lot Help from another person to put on and taking off regular upper body clothing?: A Little Help from another person to put on and taking off regular lower body clothing?: A Lot 6 Click Score: 15    End of Session Equipment Utilized During Treatment: Rolling walker (2 wheels)  OT Visit Diagnosis: Unsteadiness on feet (R26.81);Muscle weakness (generalized) (M62.81);Pain Pain - Right/Left: Right Pain - part of body: Shoulder   Activity Tolerance Patient tolerated treatment well   Patient Left in chair;with call bell/phone within reach;with chair alarm set   Nurse Communication Mobility status;Other (comment) (Iv appears loose)        Time: 2778-2423 OT Time Calculation (min): 23 min  Charges: OT General Charges $OT Visit: 1 Visit OT Treatments $Self Care/Home Management : 23-37 mins  Anna Hoffman OT OT pager: [phone]  Anna Hoffman 08/28/2021, 10:38 AM

## 2021-08-28 NOTE — Progress Notes (Signed)
Pt discharged home with son in stable condition. Discharge instructions given. No questions at this time. DC'd from unit via wheelchair.

## 2021-08-29 ENCOUNTER — Telehealth: Payer: Self-pay

## 2021-08-29 ENCOUNTER — Telehealth: Payer: Self-pay | Admitting: *Deleted

## 2021-08-29 ENCOUNTER — Ambulatory Visit: Payer: Self-pay

## 2021-08-29 ENCOUNTER — Encounter: Payer: Self-pay | Admitting: Internal Medicine

## 2021-08-29 ENCOUNTER — Telehealth: Payer: Medicare Other

## 2021-08-29 DIAGNOSIS — I1310 Hypertensive heart and chronic kidney disease without heart failure, with stage 1 through stage 4 chronic kidney disease, or unspecified chronic kidney disease: Secondary | ICD-10-CM

## 2021-08-29 DIAGNOSIS — N1831 Chronic kidney disease, stage 3a: Secondary | ICD-10-CM

## 2021-08-29 DIAGNOSIS — E1122 Type 2 diabetes mellitus with diabetic chronic kidney disease: Secondary | ICD-10-CM

## 2021-08-29 NOTE — Chronic Care Management (AMB) (Signed)
Chronic Care Management    Social Work Note  08/29/2021 Name: Anna Hoffman MRN: [id number] DOB: 1934-10-28  Anna Hoffman is a 87 y.o. year old female who is a primary care patient of Glendale Chard, MD. The CCM team was consulted to assist the patient with chronic disease management and/or care coordination needs related to:  HTN, DM II, level of care concerns .   Engaged with patients son by phone  for follow up visit in response to provider referral for social work chronic care management and care coordination services.   Consent to Services:  The patient was given information about Chronic Care Management services, agreed to services, and gave verbal consent prior to initiation of services.  Please see initial visit note for detailed documentation.   Patient agreed to services and consent obtained.   Assessment: Review of patient past medical history, allergies, medications, and health status, including review of relevant consultants reports was performed today as part of a comprehensive evaluation and provision of chronic care management and care coordination services.     SDOH (Social Determinants of Health) assessments and interventions performed:    Advanced Directives Status: Not addressed in this encounter.  CCM Care Plan  Allergies  Allergen Reactions   Amoxicillin Diarrhea    Has patient had a PCN reaction causing immediate rash, facial/tongue/throat swelling, SOB or lightheadedness with hypotension: no Has patient had a PCN reaction causing severe rash involving mucus membranes or skin necrosis: no Has patient had a PCN reaction that required hospitalization: no pharmacist consult Has patient had a PCN reaction occurring within the last 10 years: yes If all of the above answers are "NO", then may proceed with Cephalosporin use.    Cefepime Other (See Comments)    Possible CNS toxicity 05/2021   Ampicillin Rash    - Tolerates Rocephin and Ancef - Remote occurrence;  no symptoms of anaphylaxis or severe cutaneous reaction, and no additional medical attention required     Sulfa Antibiotics Rash    Outpatient Encounter Medications as of 08/29/2021  Medication Sig Note   acetaminophen (TYLENOL) 325 MG tablet Take 650 mg by mouth every 6 (six) hours as needed for mild pain (Fever >/= 101). 08/25/2021: Med removed by accident.   amLODipine (NORVASC) 10 MG tablet Take 1 tablet (10 mg total) by mouth daily. (Patient not taking: Reported on 08/25/2021)    aspirin 81 MG EC tablet Take 1 tablet (81 mg total) by mouth daily. Swallow whole.    fexofenadine (ALLEGRA ALLERGY) 180 MG tablet Take 1 tablet (180 mg total) by mouth daily.    glucose blood test strip Use as directed to check blood sugars 1 time per day.    glucose monitoring kit (FREESTYLE) monitoring kit Use as directed to check blood sugars 1 time per day dx: e11.22    insulin aspart (NOVOLOG) 100 UNIT/ML FlexPen Inject into the skin with meals per sliding scale. 151-200= 2 units, 201-250= 4 units, 251-300= 6 units, 301-350=8 units, 351-400=10 units, >401=10 units and notify MD (Patient not taking: Reported on 08/25/2021)    Lancets (ONETOUCH DELICA PLUS LNLGXQ11H) MISC Use to check blood sugar once daily.    metoprolol tartrate (LOPRESSOR) 25 MG tablet Take 0.5 tablets (12.5 mg total) by mouth 2 (two) times daily.    pantoprazole (PROTONIX) 40 MG tablet Take 1 tablet (40 mg total) by mouth 2 (two) times daily. (Patient taking differently: Take 40 mg by mouth daily.)    rosuvastatin (CRESTOR) 20 MG  tablet Take 1 tablet (20 mg total) by mouth daily.    saccharomyces boulardii (FLORASTOR) 250 MG capsule Take 1 capsule (250 mg total) by mouth 2 (two) times daily. (Patient not taking: Reported on 08/25/2021)    Facility-Administered Encounter Medications as of 08/29/2021  Medication   cyanocobalamin ((VITAMIN B-12)) injection 1,000 mcg    Patient Active Problem List   Diagnosis Date Noted   Protein-calorie  malnutrition, severe 08/26/2021   Urinary tract infection 08/24/2021   Urinary tract infection due to extended-spectrum beta lactamase (ESBL) producing Escherichia coli 08/24/2021   Generalized weakness 03/70/4888   Acute metabolic encephalopathy    Malnutrition of moderate degree 05/24/2021   Sepsis ([place]) 05/23/2021   SIRS (systemic inflammatory response syndrome) ([place]) 05/22/2021   History of CVA (cerebrovascular accident) 05/22/2021   Gait instability 04/24/2021   Seasonal allergic rhinitis due to pollen 04/24/2021   Ascending aorta dilatation ([place]) 04/05/2021   HOCM (hypertrophic obstructive cardiomyopathy) ([place]) 04/04/2021   Physical deconditioning 04/04/2021   Syncope 04/03/2021   Intractable vomiting with nausea 04/03/2021   Fall at home, initial encounter 03/31/2021   Thyroid nodule 03/31/2021   CAP (community acquired pneumonia) 11/15/2020   Dehydration 11/15/2020   Erosive esophagitis    Hiatal hernia    Hyponatremia 10/10/2020   Acute kidney injury superimposed on chronic kidney disease ([place]) 10/10/2020   Aortic atherosclerosis ([place]) 10/09/2020   Acute renal failure superimposed on stage 3a chronic kidney disease ([place]) 10/09/2020   Hyperkalemia 10/09/2020   Hearing loss 10/09/2020   Prolonged QT interval 10/09/2020   Colitis, acute 08/11/2019   Colitis 07/11/2019   Loose stools 06/07/2019   Herpes zoster without complication 91/69/4503   Other long term (current) drug therapy 08/12/2018   Chronic kidney disease, stage 3a ([place]) 07/19/2018   Hypertensive nephropathy 07/19/2018   Community acquired pneumonia of right upper lobe of lung 11/07/2017   Hypertensive emergency 07/01/2016   Thalamic hemorrhage ([place]) 07/01/2016   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[place])    Benign essential HTN    Type 2 diabetes mellitus with stage 3 chronic kidney disease, without long-term current use of insulin ([place])    Mixed hyperlipidemia    ICH (intracerebral hemorrhage) ([place]) -  hypertensive R thalamic hemorrhage  06/27/2016   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   Retroperitoneal bleed 02/08/2016   Generalized abdominal pain    Gastroenteritis 02/04/2016   Diarrhea 02/04/2016   Hypokalemia 02/04/2016   Incarcerated paraesophageal hernia 09/02/2014   Renal mass, left 09/02/2014   Acute esophagitis 09/02/2014   Gastric outlet obstruction 09/02/2014   Hypertension    Vomiting 09/01/2014    Conditions to be addressed/monitored: HTN and DMII; Level of care concerns  Care Plan : Social Work Care Plan  Updates made by Daneen Schick since 08/29/2021 12:00 AM     Problem: Mobility and Independence      Goal: Mobility and Independence Optimized   Start Date: 04/24/2021  This Visit's Progress: On track  Recent Progress: On track  Priority: High  Note:   Current Barriers:  Chronic disease management support and education needs related to HTN and DM  ADL IADL limitations and Limited access to caregiver  Social Worker Clinical Goal(s):  patient will work with SW to identify and address any acute and/or chronic care coordination needs related to the self health management of HTN and DM  patient will work with SW to address concerns related to level of care needs  SW Interventions:  Inter-disciplinary care team collaboration (see  longitudinal plan of care) Collaboration with Glendale Chard, MD regarding development and update of comprehensive plan of care as evidenced by provider attestation and co-signature Collaboration with patients primary provider to discuss plans to refer to [facility] to assist with in home primary care services Collaboration with Reche Dixon with [facility] who confirms referral was received and will plan to follow up with the patient regarding a new patient appointment Call completed with the patients son [name] who requests feedback on home health plans and the different in home health versus [facility]; verbal  education on the [facility] program provided by phone Performed chart review to note patient was active with [facility] prior to hospital admission; advised Mr. Lacie Scotts would contact [facility] to confirm they will resume care Successful outbound call placed th Cara with [facility] who confirms resumption of care orders received with plans to visit the patient on Saturday 1.25.23 Inbound call received from [employer] with [facility] in [place] who advised SW they were able to make a bed offer for a female Medicaid SNF bed Discussed the director has agreed to accept the patient without up front payment pending the patients family agrees to provide patients February social security payment to the community Discussed plans for SW to provide Mrs. Fitzgerald's contact information to the patients son to arrange a time to complete admission paperwork Determined Mrs. Ola Spurr will need an updated FL2 from the patients primary provider prior to admission; discussed a new Medicaid application may also need to be completed if the current application is out of date Advised SW will obtain updated FL2 and provide to Mrs. Bandana with Ronalee Red, Eligibility Supervisor with [facility] to assess if a new application needs to be completed. Mrs. Rosine Door stated: "No new application is needed. When the approved FL2 is received by [place], with all requirements met, we will complete a program change to LTC Medicaid." Advised Mrs. Fitzgerald of response from [place] regarding Medicaid application, advised SW will submit completed FL2 once received from the patients provider Successful outbound call completed with the patients son [name] who accepts bed offer, provided Mr Jimmye Norman with contact number to [place] with Reche Dixon with [facility] to advise of plans for placement for the patient; discussed Mrs. Beryl Meager will  hold referral at this time. SW will contact Mrs. Beryl Meager if needed to initiate in home primary care services Communication received from Mrs. Ola Spurr indicating the patients son will visit the community on Friday 1.27 to complete admission paperwork with a planned admission of either Monday 1.30 or Tuesday 1.31 Collaboration with Dr. Baird Cancer to advise SW has initiated new application and request receipt of completed application by the morning of Monday 1.30 in order to proceed with placement Collaboration with [place] to advise of interventions and plan  Patient Goals/Self-Care Activities patient will: with the help of her son  - Complete admission paperwork with [facility] as needed  Follow Up Plan:  SW will follow up with the patient on Monday 1.30      Follow Up Plan: SW will follow up with patient by phone over the next 5 days.      Daneen Schick, BSW, CDP Social Worker, Certified Dementia Practitioner [place] [facility] [place] [facility] [phone]

## 2021-08-29 NOTE — Telephone Encounter (Signed)
Transition Care Management Unsuccessful Follow-up Telephone Call  Date of discharge and from where:  08/28/2021 Anna Hoffman  Attempts:  1st Attempt  Reason for unsuccessful TCM follow-up call:  Unable to leave message

## 2021-08-29 NOTE — Chronic Care Management (AMB) (Signed)
°  Care Management   Note  08/29/2021 Name: Aine Strycharz MRN: [id number] DOB: [birth year]  Alayna Mabe is a 87 y.o. year old female who is a primary care patient of Glendale Chard, MD and is actively engaged with the care management team. I reached out to Vita Barley Bogdon by phone today to assist with scheduling an initial visit with the Pharmacist  Follow up plan: Unsuccessful telephone outreach attempt made. A HIPAA compliant phone message was left for the patient providing contact information and requesting a return call.  The care management team will reach out to the patient again over the next 7 days.  If patient returns call to provider office, please advise to call [place] at [phone].  [place] [facility]  Direct Dial: [phone]

## 2021-08-29 NOTE — Patient Instructions (Signed)
Social Worker Visit Information  Goals we discussed today:   Goals Addressed             This Visit's Progress    Mobility and Independence Optimized   On track    Timeframe:  Short-Term Goal Priority:  High Start Date:  9.21.22                                              Patient Goals/Self-Care Activities patient will: with the help of her son  - Complete admission paperwork with [facility] as needed         Materials Provided: Verbal education about placement provided by phone  Patient verbalizes understanding of instructions and care plan provided today and agrees to view in [place]. Active MyChart status confirmed with patient.    Follow Up Plan: SW will follow up with patient by phone over the next 5 days Daneen Schick, BSW, CDP Social Worker, Certified Dementia Practitioner [place] [facility] [place] [facility] [phone]

## 2021-08-29 NOTE — Telephone Encounter (Signed)
°  Care Management   Follow Up Note   08/29/2021 Name: Pantera Winterrowd MRN: [id number] DOB: 09-05-1934   Referred by: Glendale Chard, MD Reason for referral : Chronic Care Management (RN CM Follow up call )   An unsuccessful telephone outreach was attempted today. The patient was referred to the case management team for assistance with care management and care coordination.   Follow Up Plan: The care management team will reach out to the patient again over the next 7 days.   Barb Merino, RN, BSN, CCM Care Management Coordinator [place] [facility]  Direct Phone: [phone]

## 2021-08-30 ENCOUNTER — Other Ambulatory Visit: Payer: Self-pay | Admitting: Internal Medicine

## 2021-08-30 ENCOUNTER — Other Ambulatory Visit (HOSPITAL_COMMUNITY): Payer: Self-pay

## 2021-08-30 DIAGNOSIS — Z7982 Long term (current) use of aspirin: Secondary | ICD-10-CM | POA: Diagnosis not present

## 2021-08-30 DIAGNOSIS — H9190 Unspecified hearing loss, unspecified ear: Secondary | ICD-10-CM | POA: Diagnosis not present

## 2021-08-30 DIAGNOSIS — I700 Atherosclerosis of aorta: Secondary | ICD-10-CM | POA: Diagnosis not present

## 2021-08-30 DIAGNOSIS — E876 Hypokalemia: Secondary | ICD-10-CM | POA: Diagnosis not present

## 2021-08-30 DIAGNOSIS — E1122 Type 2 diabetes mellitus with diabetic chronic kidney disease: Secondary | ICD-10-CM | POA: Diagnosis not present

## 2021-08-30 DIAGNOSIS — E1140 Type 2 diabetes mellitus with diabetic neuropathy, unspecified: Secondary | ICD-10-CM | POA: Diagnosis not present

## 2021-08-30 DIAGNOSIS — N1831 Chronic kidney disease, stage 3a: Secondary | ICD-10-CM | POA: Diagnosis not present

## 2021-08-30 DIAGNOSIS — N179 Acute kidney failure, unspecified: Secondary | ICD-10-CM | POA: Diagnosis not present

## 2021-08-30 DIAGNOSIS — I10 Essential (primary) hypertension: Secondary | ICD-10-CM | POA: Diagnosis not present

## 2021-08-30 DIAGNOSIS — I421 Obstructive hypertrophic cardiomyopathy: Secondary | ICD-10-CM | POA: Diagnosis not present

## 2021-08-30 DIAGNOSIS — M1990 Unspecified osteoarthritis, unspecified site: Secondary | ICD-10-CM | POA: Diagnosis not present

## 2021-08-30 DIAGNOSIS — Z794 Long term (current) use of insulin: Secondary | ICD-10-CM | POA: Diagnosis not present

## 2021-08-30 DIAGNOSIS — Z8673 Personal history of transient ischemic attack (TIA), and cerebral infarction without residual deficits: Secondary | ICD-10-CM | POA: Diagnosis not present

## 2021-08-30 DIAGNOSIS — G9341 Metabolic encephalopathy: Secondary | ICD-10-CM | POA: Diagnosis not present

## 2021-08-30 DIAGNOSIS — E440 Moderate protein-calorie malnutrition: Secondary | ICD-10-CM | POA: Diagnosis not present

## 2021-08-30 DIAGNOSIS — E872 Acidosis, unspecified: Secondary | ICD-10-CM | POA: Diagnosis not present

## 2021-08-30 DIAGNOSIS — J301 Allergic rhinitis due to pollen: Secondary | ICD-10-CM | POA: Diagnosis not present

## 2021-08-30 DIAGNOSIS — I77810 Thoracic aortic ectasia: Secondary | ICD-10-CM | POA: Diagnosis not present

## 2021-08-30 DIAGNOSIS — Z9181 History of falling: Secondary | ICD-10-CM | POA: Diagnosis not present

## 2021-08-30 DIAGNOSIS — N390 Urinary tract infection, site not specified: Secondary | ICD-10-CM | POA: Diagnosis not present

## 2021-08-30 DIAGNOSIS — E041 Nontoxic single thyroid nodule: Secondary | ICD-10-CM | POA: Diagnosis not present

## 2021-08-30 DIAGNOSIS — E785 Hyperlipidemia, unspecified: Secondary | ICD-10-CM | POA: Diagnosis not present

## 2021-08-31 MED ORDER — PANTOPRAZOLE SODIUM 40 MG PO TBEC
40.0000 mg | DELAYED_RELEASE_TABLET | Freq: Two times a day (BID) | ORAL | 0 refills | Status: AC
Start: 2021-08-31 — End: ?
  Filled 2021-08-31: qty 60, 30d supply, fill #0

## 2021-09-02 ENCOUNTER — Other Ambulatory Visit (HOSPITAL_COMMUNITY): Payer: Self-pay

## 2021-09-02 ENCOUNTER — Other Ambulatory Visit (INDEPENDENT_AMBULATORY_CARE_PROVIDER_SITE_OTHER): Payer: Medicare Other | Admitting: Internal Medicine

## 2021-09-02 ENCOUNTER — Ambulatory Visit: Payer: Medicare Other

## 2021-09-02 ENCOUNTER — Other Ambulatory Visit: Payer: Self-pay

## 2021-09-02 ENCOUNTER — Other Ambulatory Visit: Payer: Medicare Other

## 2021-09-02 DIAGNOSIS — Z1152 Encounter for screening for COVID-19: Secondary | ICD-10-CM | POA: Diagnosis not present

## 2021-09-02 DIAGNOSIS — N1831 Chronic kidney disease, stage 3a: Secondary | ICD-10-CM

## 2021-09-02 DIAGNOSIS — E1122 Type 2 diabetes mellitus with diabetic chronic kidney disease: Secondary | ICD-10-CM

## 2021-09-02 DIAGNOSIS — I10 Essential (primary) hypertension: Secondary | ICD-10-CM

## 2021-09-02 LAB — POC COVID19 BINAXNOW: SARS Coronavirus 2 Ag: NEGATIVE

## 2021-09-02 NOTE — Chronic Care Management (AMB) (Signed)
°  Care Management   Note  09/02/2021 Name: Anna Hoffman MRN: [id number] DOB: 1935-03-14  Anna Hoffman is a 87 y.o. year old female who is a primary care patient of Glendale Chard, MD and is actively engaged with the care management team. I reached out to Anna Hoffman by phone today to assist with scheduling an initial visit with the Pharmacist  Follow up plan: Patient son Anna Hoffman declines further follow up and engagement by the care management team with Pharmacy due to patient being admitted into SNF today. Appropriate care team members and provider have been notified via electronic communication.   The care management team is available to follow up with the patient after provider conversation with the patient regarding recommendation for care management engagement and subsequent re-referral to the care management team.   [place] [facility]  Direct Dial: [phone]

## 2021-09-02 NOTE — Patient Instructions (Signed)
Social Worker Visit Information  Goals we discussed today:   Goals Addressed             This Visit's Progress    COMPLETED: Mobility and Independence Optimized       Timeframe:  Short-Term Goal Priority:  High Start Date:  9.21.22                                              Patient Goals/Self-Care Activities patient will: with the help of her son  - Move into [facility] the morning of 1.31.23 -Access medical care via in-house primary provider at [facility]         Patient verbalizes understanding of instructions and care plan provided today and agrees to view in [place]. Active MyChart status confirmed with patient.    Follow Up Plan:  No follow up planned at this time. I hope you enjoy [facility]!    Daneen Schick, BSW, CDP Social Worker, Certified Dementia Practitioner [place] [facility] [place] [facility] [phone]

## 2021-09-02 NOTE — Chronic Care Management (AMB) (Signed)
Chronic Care Management - Case Closure   Social Work Note  09/02/2021 Name: Anna Hoffman MRN: [id number] DOB: 1934-09-17  Anna Hoffman is a 87 y.o. year old female who is a primary care patient of Glendale Chard, MD. The CCM team was consulted to assist the patient with chronic disease management and/or care coordination needs related to:  HTN, DM II, Level of Care Needs .   Engaged with patients son [name] by phone  for follow up visit in response to provider referral for social work chronic care management and care coordination services.   Consent to Services:  The patient was given information about Chronic Care Management services, agreed to services, and gave verbal consent prior to initiation of services.  Please see initial visit note for detailed documentation.   Patient agreed to services and consent obtained.   Assessment: Review of patient past medical history, allergies, medications, and health status, including review of relevant consultants reports was performed today as part of a comprehensive evaluation and provision of chronic care management and care coordination services.     SDOH (Social Determinants of Health) assessments and interventions performed:    Advanced Directives Status: Not addressed in this encounter.  CCM Care Plan  Allergies  Allergen Reactions   Amoxicillin Diarrhea    Has patient had a PCN reaction causing immediate rash, facial/tongue/throat swelling, SOB or lightheadedness with hypotension: no Has patient had a PCN reaction causing severe rash involving mucus membranes or skin necrosis: no Has patient had a PCN reaction that required hospitalization: no pharmacist consult Has patient had a PCN reaction occurring within the last 10 years: yes If all of the above answers are "NO", then may proceed with Cephalosporin use.    Cefepime Other (See Comments)    Possible CNS toxicity 05/2021   Ampicillin Rash    - Tolerates Rocephin and Ancef -  Remote occurrence; no symptoms of anaphylaxis or severe cutaneous reaction, and no additional medical attention required     Sulfa Antibiotics Rash    Outpatient Encounter Medications as of 09/02/2021  Medication Sig Note   acetaminophen (TYLENOL) 325 MG tablet Take 650 mg by mouth every 6 (six) hours as needed for mild pain (Fever >/= 101). 08/25/2021: Med removed by accident.   amLODipine (NORVASC) 10 MG tablet Take 1 tablet (10 mg total) by mouth daily. (Patient not taking: Reported on 08/25/2021)    aspirin 81 MG EC tablet Take 1 tablet (81 mg total) by mouth daily. Swallow whole.    fexofenadine (ALLEGRA ALLERGY) 180 MG tablet Take 1 tablet (180 mg total) by mouth daily.    glucose blood test strip Use as directed to check blood sugars 1 time per day.    glucose monitoring kit (FREESTYLE) monitoring kit Use as directed to check blood sugars 1 time per day dx: e11.22    insulin aspart (NOVOLOG) 100 UNIT/ML FlexPen Inject into the skin with meals per sliding scale. 151-200= 2 units, 201-250= 4 units, 251-300= 6 units, 301-350=8 units, 351-400=10 units, >401=10 units and notify MD (Patient not taking: Reported on 08/25/2021)    Lancets (ONETOUCH DELICA PLUS [id number]) MISC Use to check blood sugar once daily.    metoprolol tartrate (LOPRESSOR) 25 MG tablet Take 0.5 tablets (12.5 mg total) by mouth 2 (two) times daily.    pantoprazole (PROTONIX) 40 MG tablet Take 1 tablet (40 mg total) by mouth 2 (two) times daily.    rosuvastatin (CRESTOR) 20 MG tablet Take 1 tablet (20 mg  total) by mouth daily.    saccharomyces boulardii (FLORASTOR) 250 MG capsule Take 1 capsule (250 mg total) by mouth 2 (two) times daily. (Patient not taking: Reported on 08/25/2021)    Facility-Administered Encounter Medications as of 09/02/2021  Medication   cyanocobalamin ((VITAMIN B-12)) injection 1,000 mcg    Patient Active Problem List   Diagnosis Date Noted   Protein-calorie malnutrition, severe 08/26/2021   Urinary  tract infection 08/24/2021   Urinary tract infection due to extended-spectrum beta lactamase (ESBL) producing Escherichia coli 08/24/2021   Generalized weakness 16/02/3709   Acute metabolic encephalopathy    Malnutrition of moderate degree 05/24/2021   Sepsis ([place]) 05/23/2021   SIRS (systemic inflammatory response syndrome) ([place]) 05/22/2021   History of CVA (cerebrovascular accident) 05/22/2021   Gait instability 04/24/2021   Seasonal allergic rhinitis due to pollen 04/24/2021   Ascending aorta dilatation ([place]) 04/05/2021   HOCM (hypertrophic obstructive cardiomyopathy) ([place]) 04/04/2021   Physical deconditioning 04/04/2021   Syncope 04/03/2021   Intractable vomiting with nausea 04/03/2021   Fall at home, initial encounter 03/31/2021   Thyroid nodule 03/31/2021   CAP (community acquired pneumonia) 11/15/2020   Dehydration 11/15/2020   Erosive esophagitis    Hiatal hernia    Hyponatremia 10/10/2020   Acute kidney injury superimposed on chronic kidney disease ([place]) 10/10/2020   Aortic atherosclerosis ([place]) 10/09/2020   Acute renal failure superimposed on stage 3a chronic kidney disease ([place]) 10/09/2020   Hyperkalemia 10/09/2020   Hearing loss 10/09/2020   Prolonged QT interval 10/09/2020   Colitis, acute 08/11/2019   Colitis 07/11/2019   Loose stools 06/07/2019   Herpes zoster without complication 62/69/4854   Other long term (current) drug therapy 08/12/2018   Chronic kidney disease, stage 3a ([place]) 07/19/2018   Hypertensive nephropathy 07/19/2018   Community acquired pneumonia of right upper lobe of lung 11/07/2017   Hypertensive emergency 07/01/2016   Thalamic hemorrhage ([place]) 07/01/2016   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[place])    Benign essential HTN    Type 2 diabetes mellitus with stage 3 chronic kidney disease, without long-term current use of insulin ([place])    Mixed hyperlipidemia    ICH (intracerebral hemorrhage) ([place]) - hypertensive R thalamic hemorrhage  06/27/2016    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   Retroperitoneal bleed 02/08/2016   Generalized abdominal pain    Gastroenteritis 02/04/2016   Diarrhea 02/04/2016   Hypokalemia 02/04/2016   Incarcerated paraesophageal hernia 09/02/2014   Renal mass, left 09/02/2014   Acute esophagitis 09/02/2014   Gastric outlet obstruction 09/02/2014   Hypertension    Vomiting 09/01/2014    Conditions to be addressed/monitored: HTN and DMII; Level of care concerns  Care Plan : Social Work Care Plan  Updates made by Daneen Schick since 09/02/2021 12:00 AM  Completed 09/02/2021   Problem: Mobility and Independence Resolved 09/02/2021     Goal: Mobility and Independence Optimized Completed 09/02/2021  Start Date: 04/24/2021  Recent Progress: On track  Priority: High  Note:   Current Barriers:  Chronic disease management support and education needs related to HTN and DM  ADL IADL limitations and Limited access to caregiver  Social Worker Clinical Goal(s):  patient will work with SW to identify and address any acute and/or chronic care coordination needs related to the self health management of HTN and DM  patient will work with SW to address concerns related to level of care needs  SW Interventions:  Inter-disciplinary care team collaboration (see longitudinal plan of care) Collaboration with Glendale Chard, MD  regarding development and update of comprehensive plan of care as evidenced by provider attestation and co-signature Collaboration with Rolin Barry to confirm receipt of FL2 - confirmed, patient cleared to move in Successful outbound call placed to patients son [name] who reports the patient completed COVID testing today which was negative and will move into [facility] the morning of 1.31 Discussed the patient is down about moving in but the family has discussed how this is the safest option for her. Patient agreeable to placement at this time Determined the patient will utilize in  house provider at [facility] as her primary care provider SW to perform a case closure as patients goals have been met Collaboration with [place] to advise of patients plan for placement and case closure to care management team  Patient Goals/Self-Care Activities patient will: with the help of her son  - Move into [facility] the morning of 1.31.23 -Access medical care via in-house primary provider at [place] : [facility]  Updates made by Daneen Schick since 09/02/2021 12:00 AM  Completed 09/02/2021   Problem: No Plan of Care established for management of chronic disease states (Type 2 DM with Stage III CKD, HTN, Hypertensive Nephropathy, Diastolic Dysfunction) Resolved 09/02/2021  Priority: High  Note:   Goal closed - patient to move into LTC SNF on 1.31.23 and will access primary care from in-house provider    Long-Range Goal: Establishment of plan of care for management of chronic disease states (Type 2 DM with Stage III CKD, HTN, Hypertensive Nephropathy, Diastolic Dysfunction) Completed 09/02/2021  Start Date: 08/02/2021  Expected End Date: 08/02/2022  Recent Progress: On track  Priority: High  Note:   Current Barriers:  Knowledge Deficits related to plan of care for management of Type 2 DM with Stage III CKD, HTN, Hypertensive Nephropathy, Diastolic Dysfunction  Chronic Disease Management support and education needs related to Type 2 DM with Stage III CKD, HTN, Hypertensive Nephropathy, Diastolic Dysfunction   RNCM Clinical Goal(s):  Patient will verbalize basic understanding of  Type 2 DM with Stage III CKD, HTN, Hypertensive Nephropathy, Diastolic Dysfunction disease process and self health management plan as evidenced by patient will report having no disease exacerbations related to her chronic disease states  take all medications exactly as prescribed and will call provider for medication related  questions as evidenced by patient will report having no misses doses of her prescribed medications  demonstrate Improved health management independence as evidenced by patient will report 100% adherence to her prescribed treatment plan  continue to work with RN Care Manager to address care management and care coordination needs related to  Type 2 DM with Stage III CKD, HTN, Hypertensive Nephropathy, Diastolic Dysfunction as evidenced by adherence to CM Team Scheduled appointments demonstrate ongoing self health care management ability   as evidenced by    through collaboration with RN Care manager, provider, and care team.   Interventions: 1:1 collaboration with primary care provider regarding development and update of comprehensive plan of care as evidenced by provider attestation and co-signature Inter-disciplinary care team collaboration (see longitudinal plan of care) Evaluation of current treatment plan related to  self management and patient's adherence to plan as established by provider   Heart Failure Interventions:  (Status:  Condition stable.  Not addressed this visit.) Long Term Goal Basic overview and discussion of pathophysiology of Heart Failure reviewed Provided education on low sodium diet Assessed need  for readable accurate scales in home Advised patient to weigh each morning after emptying bladder Discussed importance of daily weight and advised patient to weigh and record daily Discussed the importance of keeping all appointments with provider Assessed social determinant of health barriers  Discussed plans with patient for ongoing care management follow up and provided patient with direct contact information for care management team  Diabetes Interventions:  (Status:  Condition stable.  Not addressed this visit.) Long Term Goal Assessed patient's understanding of A1c goal:  <5.7 Provided education to patient about basic DM disease process Reviewed medications with patient  and discussed importance of medication adherence Counseled on importance of regular laboratory monitoring as prescribed Review of patient status, including review of consultants reports, relevant laboratory and other test results, and medications completed Discussed plans with patient for ongoing care management follow up and provided patient with direct contact information for care management team Lab Results  Component Value Date   HGBA1C 5.8 (H) 07/12/2021   Hypertension Interventions:  (Status:  Condition stable.  Not addressed this visit.) Long Term Goal Last practice recorded BP readings:  BP Readings from Last 3 Encounters:  07/15/21 (!) 147/80  07/10/21 136/78  06/06/21 (!) 148/78  Most recent eGFR/CrCl:  Lab Results  Component Value Date   EGFR 28 (L) 07/10/2021    No components found for: CRCL  Evaluation of current treatment plan related to hypertension self management and patient's adherence to plan as established by provider Provided education on prescribed diet low Sodium  Discussed complications of poorly controlled blood pressure such as heart disease, stroke, circulatory complications, vision complications, kidney impairment, sexual dysfunction Discussed plans with patient for ongoing care management follow up and provided patient with direct contact information for care management team  Frailty Interventions:  (Status:  New goal.)  Short Term Goal Evaluation of current treatment plan related to  Type 2 DM with Stage III CKD, HTN, Hypertensive Nephropathy, Diastolic Dysfunction  ,  self-management and patient's adherence to plan as established by provider Assessed for home safety concerns and or DME; Determined patient feels she is improving since being home from her recent IP event Discussed patient is receiving in home PT/OT twice weekly, she is working on strengthening and balance, she is following a HEP as tolerated Discussed patient's son and family are providing  meals to her, she reports having a good appetite, reports drinking 3-4 bottles of water daily  Determined patient would like to stay in her home long term and expressed she does not wish to be placed in a long term care facility; Encouraged patient to discuss her concerns with son [name] Encouraged patient to notify her family and PCP of new symptoms or other health related concerns Encouraged patient to take her time with ambulation and or when changing positions, instructed to report any/all falls promptly  Collaborated with embedded BSW Daneen Schick and PCP Dr. Glendale Chard regarding patient's concerns for placement  Discussed plans with patient for ongoing care management follow up and provided patient with direct contact information for care management team   Patient Goals/Self-Care Activities: Take all medications as prescribed Attend all scheduled provider appointments Call pharmacy for medication refills 3-7 days in advance of running out of medications Call provider office for new concerns or questions  Work with the social worker to address care coordination needs and will continue to work with the clinical team to address health care and disease management related needs track weight in diary use salt in  moderation drink 6 to 8 glasses of water each day manage portion size take medications for blood pressure exactly as prescribed report new symptoms to your doctor      Follow Up Plan:  No follow up planned at this time as the patient will begin receiving primary care from [facility] in house provider.      Daneen Schick, BSW, CDP Social Worker, Certified Dementia Practitioner [place] [facility] [place] [facility] [phone]

## 2021-09-03 LAB — NOVEL CORONAVIRUS, NAA: SARS-CoV-2, NAA: NOT DETECTED

## 2021-09-03 LAB — SARS-COV-2, NAA 2 DAY TAT

## 2021-09-04 DIAGNOSIS — M6281 Muscle weakness (generalized): Secondary | ICD-10-CM | POA: Diagnosis not present

## 2021-09-04 DIAGNOSIS — R296 Repeated falls: Secondary | ICD-10-CM | POA: Diagnosis not present

## 2021-09-04 DIAGNOSIS — M25511 Pain in right shoulder: Secondary | ICD-10-CM | POA: Diagnosis not present

## 2021-09-04 DIAGNOSIS — R2689 Other abnormalities of gait and mobility: Secondary | ICD-10-CM | POA: Diagnosis not present

## 2021-09-04 DIAGNOSIS — I639 Cerebral infarction, unspecified: Secondary | ICD-10-CM | POA: Diagnosis not present

## 2021-09-04 DIAGNOSIS — I440 Atrioventricular block, first degree: Secondary | ICD-10-CM | POA: Diagnosis not present

## 2021-09-04 DIAGNOSIS — I679 Cerebrovascular disease, unspecified: Secondary | ICD-10-CM | POA: Diagnosis not present

## 2021-09-04 DIAGNOSIS — I422 Other hypertrophic cardiomyopathy: Secondary | ICD-10-CM | POA: Diagnosis not present

## 2021-09-04 DIAGNOSIS — R52 Pain, unspecified: Secondary | ICD-10-CM | POA: Diagnosis not present

## 2021-09-04 DIAGNOSIS — M75101 Unspecified rotator cuff tear or rupture of right shoulder, not specified as traumatic: Secondary | ICD-10-CM | POA: Diagnosis not present

## 2021-09-04 DIAGNOSIS — I421 Obstructive hypertrophic cardiomyopathy: Secondary | ICD-10-CM | POA: Diagnosis not present

## 2021-09-04 DIAGNOSIS — R488 Other symbolic dysfunctions: Secondary | ICD-10-CM | POA: Diagnosis not present

## 2021-09-05 DIAGNOSIS — R52 Pain, unspecified: Secondary | ICD-10-CM | POA: Diagnosis not present

## 2021-09-05 DIAGNOSIS — R296 Repeated falls: Secondary | ICD-10-CM | POA: Diagnosis not present

## 2021-09-05 DIAGNOSIS — I422 Other hypertrophic cardiomyopathy: Secondary | ICD-10-CM | POA: Diagnosis not present

## 2021-09-05 DIAGNOSIS — I679 Cerebrovascular disease, unspecified: Secondary | ICD-10-CM | POA: Diagnosis not present

## 2021-09-05 DIAGNOSIS — M6281 Muscle weakness (generalized): Secondary | ICD-10-CM | POA: Diagnosis not present

## 2021-09-05 DIAGNOSIS — I440 Atrioventricular block, first degree: Secondary | ICD-10-CM | POA: Diagnosis not present

## 2021-09-05 DIAGNOSIS — R488 Other symbolic dysfunctions: Secondary | ICD-10-CM | POA: Diagnosis not present

## 2021-09-05 DIAGNOSIS — M75101 Unspecified rotator cuff tear or rupture of right shoulder, not specified as traumatic: Secondary | ICD-10-CM | POA: Diagnosis not present

## 2021-09-05 DIAGNOSIS — M25511 Pain in right shoulder: Secondary | ICD-10-CM | POA: Diagnosis not present

## 2021-09-05 DIAGNOSIS — R2689 Other abnormalities of gait and mobility: Secondary | ICD-10-CM | POA: Diagnosis not present

## 2021-09-06 DIAGNOSIS — I422 Other hypertrophic cardiomyopathy: Secondary | ICD-10-CM | POA: Diagnosis not present

## 2021-09-06 DIAGNOSIS — M75101 Unspecified rotator cuff tear or rupture of right shoulder, not specified as traumatic: Secondary | ICD-10-CM | POA: Diagnosis not present

## 2021-09-06 DIAGNOSIS — I679 Cerebrovascular disease, unspecified: Secondary | ICD-10-CM | POA: Diagnosis not present

## 2021-09-06 DIAGNOSIS — R52 Pain, unspecified: Secondary | ICD-10-CM | POA: Diagnosis not present

## 2021-09-06 DIAGNOSIS — I440 Atrioventricular block, first degree: Secondary | ICD-10-CM | POA: Diagnosis not present

## 2021-09-06 DIAGNOSIS — M25511 Pain in right shoulder: Secondary | ICD-10-CM | POA: Diagnosis not present

## 2021-09-06 DIAGNOSIS — R296 Repeated falls: Secondary | ICD-10-CM | POA: Diagnosis not present

## 2021-09-06 DIAGNOSIS — R2689 Other abnormalities of gait and mobility: Secondary | ICD-10-CM | POA: Diagnosis not present

## 2021-09-06 DIAGNOSIS — R488 Other symbolic dysfunctions: Secondary | ICD-10-CM | POA: Diagnosis not present

## 2021-09-06 DIAGNOSIS — M6281 Muscle weakness (generalized): Secondary | ICD-10-CM | POA: Diagnosis not present

## 2021-09-09 DIAGNOSIS — R488 Other symbolic dysfunctions: Secondary | ICD-10-CM | POA: Diagnosis not present

## 2021-09-09 DIAGNOSIS — R52 Pain, unspecified: Secondary | ICD-10-CM | POA: Diagnosis not present

## 2021-09-09 DIAGNOSIS — R296 Repeated falls: Secondary | ICD-10-CM | POA: Diagnosis not present

## 2021-09-09 DIAGNOSIS — I679 Cerebrovascular disease, unspecified: Secondary | ICD-10-CM | POA: Diagnosis not present

## 2021-09-09 DIAGNOSIS — I440 Atrioventricular block, first degree: Secondary | ICD-10-CM | POA: Diagnosis not present

## 2021-09-09 DIAGNOSIS — M25511 Pain in right shoulder: Secondary | ICD-10-CM | POA: Diagnosis not present

## 2021-09-09 DIAGNOSIS — R2689 Other abnormalities of gait and mobility: Secondary | ICD-10-CM | POA: Diagnosis not present

## 2021-09-09 DIAGNOSIS — M6281 Muscle weakness (generalized): Secondary | ICD-10-CM | POA: Diagnosis not present

## 2021-09-09 DIAGNOSIS — M75101 Unspecified rotator cuff tear or rupture of right shoulder, not specified as traumatic: Secondary | ICD-10-CM | POA: Diagnosis not present

## 2021-09-09 DIAGNOSIS — I422 Other hypertrophic cardiomyopathy: Secondary | ICD-10-CM | POA: Diagnosis not present

## 2021-09-10 ENCOUNTER — Telehealth: Payer: Medicare Other

## 2021-09-10 DIAGNOSIS — M25511 Pain in right shoulder: Secondary | ICD-10-CM | POA: Diagnosis not present

## 2021-09-10 DIAGNOSIS — R296 Repeated falls: Secondary | ICD-10-CM | POA: Diagnosis not present

## 2021-09-10 DIAGNOSIS — M75101 Unspecified rotator cuff tear or rupture of right shoulder, not specified as traumatic: Secondary | ICD-10-CM | POA: Diagnosis not present

## 2021-09-10 DIAGNOSIS — I422 Other hypertrophic cardiomyopathy: Secondary | ICD-10-CM | POA: Diagnosis not present

## 2021-09-10 DIAGNOSIS — R2689 Other abnormalities of gait and mobility: Secondary | ICD-10-CM | POA: Diagnosis not present

## 2021-09-10 DIAGNOSIS — R488 Other symbolic dysfunctions: Secondary | ICD-10-CM | POA: Diagnosis not present

## 2021-09-10 DIAGNOSIS — I440 Atrioventricular block, first degree: Secondary | ICD-10-CM | POA: Diagnosis not present

## 2021-09-10 DIAGNOSIS — M6281 Muscle weakness (generalized): Secondary | ICD-10-CM | POA: Diagnosis not present

## 2021-09-10 DIAGNOSIS — R52 Pain, unspecified: Secondary | ICD-10-CM | POA: Diagnosis not present

## 2021-09-10 DIAGNOSIS — I679 Cerebrovascular disease, unspecified: Secondary | ICD-10-CM | POA: Diagnosis not present

## 2021-09-10 DIAGNOSIS — E119 Type 2 diabetes mellitus without complications: Secondary | ICD-10-CM | POA: Diagnosis not present

## 2021-09-11 ENCOUNTER — Other Ambulatory Visit (HOSPITAL_COMMUNITY): Payer: Self-pay

## 2021-09-11 DIAGNOSIS — R2689 Other abnormalities of gait and mobility: Secondary | ICD-10-CM | POA: Diagnosis not present

## 2021-09-11 DIAGNOSIS — I679 Cerebrovascular disease, unspecified: Secondary | ICD-10-CM | POA: Diagnosis not present

## 2021-09-11 DIAGNOSIS — I440 Atrioventricular block, first degree: Secondary | ICD-10-CM | POA: Diagnosis not present

## 2021-09-11 DIAGNOSIS — M25511 Pain in right shoulder: Secondary | ICD-10-CM | POA: Diagnosis not present

## 2021-09-11 DIAGNOSIS — R488 Other symbolic dysfunctions: Secondary | ICD-10-CM | POA: Diagnosis not present

## 2021-09-11 DIAGNOSIS — M6281 Muscle weakness (generalized): Secondary | ICD-10-CM | POA: Diagnosis not present

## 2021-09-11 DIAGNOSIS — M75101 Unspecified rotator cuff tear or rupture of right shoulder, not specified as traumatic: Secondary | ICD-10-CM | POA: Diagnosis not present

## 2021-09-11 DIAGNOSIS — R296 Repeated falls: Secondary | ICD-10-CM | POA: Diagnosis not present

## 2021-09-11 DIAGNOSIS — I422 Other hypertrophic cardiomyopathy: Secondary | ICD-10-CM | POA: Diagnosis not present

## 2021-09-11 DIAGNOSIS — R52 Pain, unspecified: Secondary | ICD-10-CM | POA: Diagnosis not present

## 2021-09-12 DIAGNOSIS — I679 Cerebrovascular disease, unspecified: Secondary | ICD-10-CM | POA: Diagnosis not present

## 2021-09-12 DIAGNOSIS — I422 Other hypertrophic cardiomyopathy: Secondary | ICD-10-CM | POA: Diagnosis not present

## 2021-09-12 DIAGNOSIS — I440 Atrioventricular block, first degree: Secondary | ICD-10-CM | POA: Diagnosis not present

## 2021-09-12 DIAGNOSIS — R2689 Other abnormalities of gait and mobility: Secondary | ICD-10-CM | POA: Diagnosis not present

## 2021-09-12 DIAGNOSIS — M6281 Muscle weakness (generalized): Secondary | ICD-10-CM | POA: Diagnosis not present

## 2021-09-12 DIAGNOSIS — M25511 Pain in right shoulder: Secondary | ICD-10-CM | POA: Diagnosis not present

## 2021-09-12 DIAGNOSIS — R488 Other symbolic dysfunctions: Secondary | ICD-10-CM | POA: Diagnosis not present

## 2021-09-12 DIAGNOSIS — R52 Pain, unspecified: Secondary | ICD-10-CM | POA: Diagnosis not present

## 2021-09-12 DIAGNOSIS — R296 Repeated falls: Secondary | ICD-10-CM | POA: Diagnosis not present

## 2021-09-12 DIAGNOSIS — M75101 Unspecified rotator cuff tear or rupture of right shoulder, not specified as traumatic: Secondary | ICD-10-CM | POA: Diagnosis not present

## 2021-09-13 DIAGNOSIS — R2689 Other abnormalities of gait and mobility: Secondary | ICD-10-CM | POA: Diagnosis not present

## 2021-09-13 DIAGNOSIS — I679 Cerebrovascular disease, unspecified: Secondary | ICD-10-CM | POA: Diagnosis not present

## 2021-09-13 DIAGNOSIS — I422 Other hypertrophic cardiomyopathy: Secondary | ICD-10-CM | POA: Diagnosis not present

## 2021-09-13 DIAGNOSIS — R296 Repeated falls: Secondary | ICD-10-CM | POA: Diagnosis not present

## 2021-09-13 DIAGNOSIS — R488 Other symbolic dysfunctions: Secondary | ICD-10-CM | POA: Diagnosis not present

## 2021-09-13 DIAGNOSIS — M75101 Unspecified rotator cuff tear or rupture of right shoulder, not specified as traumatic: Secondary | ICD-10-CM | POA: Diagnosis not present

## 2021-09-13 DIAGNOSIS — M6281 Muscle weakness (generalized): Secondary | ICD-10-CM | POA: Diagnosis not present

## 2021-09-13 DIAGNOSIS — R52 Pain, unspecified: Secondary | ICD-10-CM | POA: Diagnosis not present

## 2021-09-13 DIAGNOSIS — M25511 Pain in right shoulder: Secondary | ICD-10-CM | POA: Diagnosis not present

## 2021-09-13 DIAGNOSIS — I440 Atrioventricular block, first degree: Secondary | ICD-10-CM | POA: Diagnosis not present

## 2021-09-16 DIAGNOSIS — R488 Other symbolic dysfunctions: Secondary | ICD-10-CM | POA: Diagnosis not present

## 2021-09-16 DIAGNOSIS — R52 Pain, unspecified: Secondary | ICD-10-CM | POA: Diagnosis not present

## 2021-09-16 DIAGNOSIS — M6281 Muscle weakness (generalized): Secondary | ICD-10-CM | POA: Diagnosis not present

## 2021-09-16 DIAGNOSIS — I422 Other hypertrophic cardiomyopathy: Secondary | ICD-10-CM | POA: Diagnosis not present

## 2021-09-16 DIAGNOSIS — R2689 Other abnormalities of gait and mobility: Secondary | ICD-10-CM | POA: Diagnosis not present

## 2021-09-16 DIAGNOSIS — I440 Atrioventricular block, first degree: Secondary | ICD-10-CM | POA: Diagnosis not present

## 2021-09-16 DIAGNOSIS — R296 Repeated falls: Secondary | ICD-10-CM | POA: Diagnosis not present

## 2021-09-16 DIAGNOSIS — M25511 Pain in right shoulder: Secondary | ICD-10-CM | POA: Diagnosis not present

## 2021-09-16 DIAGNOSIS — I679 Cerebrovascular disease, unspecified: Secondary | ICD-10-CM | POA: Diagnosis not present

## 2021-09-16 DIAGNOSIS — M75101 Unspecified rotator cuff tear or rupture of right shoulder, not specified as traumatic: Secondary | ICD-10-CM | POA: Diagnosis not present

## 2021-09-17 DIAGNOSIS — R2689 Other abnormalities of gait and mobility: Secondary | ICD-10-CM | POA: Diagnosis not present

## 2021-09-17 DIAGNOSIS — R296 Repeated falls: Secondary | ICD-10-CM | POA: Diagnosis not present

## 2021-09-17 DIAGNOSIS — M75101 Unspecified rotator cuff tear or rupture of right shoulder, not specified as traumatic: Secondary | ICD-10-CM | POA: Diagnosis not present

## 2021-09-17 DIAGNOSIS — M6281 Muscle weakness (generalized): Secondary | ICD-10-CM | POA: Diagnosis not present

## 2021-09-17 DIAGNOSIS — M25511 Pain in right shoulder: Secondary | ICD-10-CM | POA: Diagnosis not present

## 2021-09-17 DIAGNOSIS — R488 Other symbolic dysfunctions: Secondary | ICD-10-CM | POA: Diagnosis not present

## 2021-09-17 DIAGNOSIS — R52 Pain, unspecified: Secondary | ICD-10-CM | POA: Diagnosis not present

## 2021-09-17 DIAGNOSIS — I679 Cerebrovascular disease, unspecified: Secondary | ICD-10-CM | POA: Diagnosis not present

## 2021-09-17 DIAGNOSIS — I422 Other hypertrophic cardiomyopathy: Secondary | ICD-10-CM | POA: Diagnosis not present

## 2021-09-17 DIAGNOSIS — I440 Atrioventricular block, first degree: Secondary | ICD-10-CM | POA: Diagnosis not present

## 2021-09-18 DIAGNOSIS — R488 Other symbolic dysfunctions: Secondary | ICD-10-CM | POA: Diagnosis not present

## 2021-09-18 DIAGNOSIS — R296 Repeated falls: Secondary | ICD-10-CM | POA: Diagnosis not present

## 2021-09-18 DIAGNOSIS — R52 Pain, unspecified: Secondary | ICD-10-CM | POA: Diagnosis not present

## 2021-09-18 DIAGNOSIS — I422 Other hypertrophic cardiomyopathy: Secondary | ICD-10-CM | POA: Diagnosis not present

## 2021-09-18 DIAGNOSIS — I679 Cerebrovascular disease, unspecified: Secondary | ICD-10-CM | POA: Diagnosis not present

## 2021-09-18 DIAGNOSIS — M75101 Unspecified rotator cuff tear or rupture of right shoulder, not specified as traumatic: Secondary | ICD-10-CM | POA: Diagnosis not present

## 2021-09-18 DIAGNOSIS — R2689 Other abnormalities of gait and mobility: Secondary | ICD-10-CM | POA: Diagnosis not present

## 2021-09-18 DIAGNOSIS — M6281 Muscle weakness (generalized): Secondary | ICD-10-CM | POA: Diagnosis not present

## 2021-09-18 DIAGNOSIS — M25511 Pain in right shoulder: Secondary | ICD-10-CM | POA: Diagnosis not present

## 2021-09-18 DIAGNOSIS — I440 Atrioventricular block, first degree: Secondary | ICD-10-CM | POA: Diagnosis not present

## 2021-09-19 ENCOUNTER — Ambulatory Visit: Payer: Medicare Other | Admitting: Internal Medicine

## 2021-09-19 DIAGNOSIS — I440 Atrioventricular block, first degree: Secondary | ICD-10-CM | POA: Diagnosis not present

## 2021-09-19 DIAGNOSIS — M75101 Unspecified rotator cuff tear or rupture of right shoulder, not specified as traumatic: Secondary | ICD-10-CM | POA: Diagnosis not present

## 2021-09-19 DIAGNOSIS — R52 Pain, unspecified: Secondary | ICD-10-CM | POA: Diagnosis not present

## 2021-09-19 DIAGNOSIS — I422 Other hypertrophic cardiomyopathy: Secondary | ICD-10-CM | POA: Diagnosis not present

## 2021-09-19 DIAGNOSIS — I679 Cerebrovascular disease, unspecified: Secondary | ICD-10-CM | POA: Diagnosis not present

## 2021-09-19 DIAGNOSIS — R296 Repeated falls: Secondary | ICD-10-CM | POA: Diagnosis not present

## 2021-09-19 DIAGNOSIS — M25511 Pain in right shoulder: Secondary | ICD-10-CM | POA: Diagnosis not present

## 2021-09-19 DIAGNOSIS — R2689 Other abnormalities of gait and mobility: Secondary | ICD-10-CM | POA: Diagnosis not present

## 2021-09-19 DIAGNOSIS — M6281 Muscle weakness (generalized): Secondary | ICD-10-CM | POA: Diagnosis not present

## 2021-09-19 DIAGNOSIS — R488 Other symbolic dysfunctions: Secondary | ICD-10-CM | POA: Diagnosis not present

## 2021-09-20 DIAGNOSIS — R52 Pain, unspecified: Secondary | ICD-10-CM | POA: Diagnosis not present

## 2021-09-20 DIAGNOSIS — R488 Other symbolic dysfunctions: Secondary | ICD-10-CM | POA: Diagnosis not present

## 2021-09-20 DIAGNOSIS — I679 Cerebrovascular disease, unspecified: Secondary | ICD-10-CM | POA: Diagnosis not present

## 2021-09-20 DIAGNOSIS — I422 Other hypertrophic cardiomyopathy: Secondary | ICD-10-CM | POA: Diagnosis not present

## 2021-09-20 DIAGNOSIS — R2689 Other abnormalities of gait and mobility: Secondary | ICD-10-CM | POA: Diagnosis not present

## 2021-09-20 DIAGNOSIS — R296 Repeated falls: Secondary | ICD-10-CM | POA: Diagnosis not present

## 2021-09-20 DIAGNOSIS — M75101 Unspecified rotator cuff tear or rupture of right shoulder, not specified as traumatic: Secondary | ICD-10-CM | POA: Diagnosis not present

## 2021-09-20 DIAGNOSIS — M6281 Muscle weakness (generalized): Secondary | ICD-10-CM | POA: Diagnosis not present

## 2021-09-20 DIAGNOSIS — I440 Atrioventricular block, first degree: Secondary | ICD-10-CM | POA: Diagnosis not present

## 2021-09-20 DIAGNOSIS — M25511 Pain in right shoulder: Secondary | ICD-10-CM | POA: Diagnosis not present

## 2021-09-23 ENCOUNTER — Telehealth: Payer: Medicare Other

## 2021-09-23 DIAGNOSIS — M75101 Unspecified rotator cuff tear or rupture of right shoulder, not specified as traumatic: Secondary | ICD-10-CM | POA: Diagnosis not present

## 2021-09-23 DIAGNOSIS — I422 Other hypertrophic cardiomyopathy: Secondary | ICD-10-CM | POA: Diagnosis not present

## 2021-09-23 DIAGNOSIS — M25511 Pain in right shoulder: Secondary | ICD-10-CM | POA: Diagnosis not present

## 2021-09-23 DIAGNOSIS — M6281 Muscle weakness (generalized): Secondary | ICD-10-CM | POA: Diagnosis not present

## 2021-09-23 DIAGNOSIS — I679 Cerebrovascular disease, unspecified: Secondary | ICD-10-CM | POA: Diagnosis not present

## 2021-09-23 DIAGNOSIS — R296 Repeated falls: Secondary | ICD-10-CM | POA: Diagnosis not present

## 2021-09-23 DIAGNOSIS — I440 Atrioventricular block, first degree: Secondary | ICD-10-CM | POA: Diagnosis not present

## 2021-09-23 DIAGNOSIS — R52 Pain, unspecified: Secondary | ICD-10-CM | POA: Diagnosis not present

## 2021-09-23 DIAGNOSIS — R2689 Other abnormalities of gait and mobility: Secondary | ICD-10-CM | POA: Diagnosis not present

## 2021-09-23 DIAGNOSIS — R488 Other symbolic dysfunctions: Secondary | ICD-10-CM | POA: Diagnosis not present

## 2021-09-24 DIAGNOSIS — I422 Other hypertrophic cardiomyopathy: Secondary | ICD-10-CM | POA: Diagnosis not present

## 2021-09-24 DIAGNOSIS — I440 Atrioventricular block, first degree: Secondary | ICD-10-CM | POA: Diagnosis not present

## 2021-09-24 DIAGNOSIS — M6281 Muscle weakness (generalized): Secondary | ICD-10-CM | POA: Diagnosis not present

## 2021-09-24 DIAGNOSIS — M75101 Unspecified rotator cuff tear or rupture of right shoulder, not specified as traumatic: Secondary | ICD-10-CM | POA: Diagnosis not present

## 2021-09-24 DIAGNOSIS — I679 Cerebrovascular disease, unspecified: Secondary | ICD-10-CM | POA: Diagnosis not present

## 2021-09-24 DIAGNOSIS — E1122 Type 2 diabetes mellitus with diabetic chronic kidney disease: Secondary | ICD-10-CM | POA: Diagnosis not present

## 2021-09-24 DIAGNOSIS — I10 Essential (primary) hypertension: Secondary | ICD-10-CM | POA: Diagnosis not present

## 2021-09-24 DIAGNOSIS — M25511 Pain in right shoulder: Secondary | ICD-10-CM | POA: Diagnosis not present

## 2021-09-24 DIAGNOSIS — R2689 Other abnormalities of gait and mobility: Secondary | ICD-10-CM | POA: Diagnosis not present

## 2021-09-24 DIAGNOSIS — R488 Other symbolic dysfunctions: Secondary | ICD-10-CM | POA: Diagnosis not present

## 2021-09-24 DIAGNOSIS — R296 Repeated falls: Secondary | ICD-10-CM | POA: Diagnosis not present

## 2021-09-24 DIAGNOSIS — R52 Pain, unspecified: Secondary | ICD-10-CM | POA: Diagnosis not present

## 2021-09-25 DIAGNOSIS — R296 Repeated falls: Secondary | ICD-10-CM | POA: Diagnosis not present

## 2021-09-25 DIAGNOSIS — M25511 Pain in right shoulder: Secondary | ICD-10-CM | POA: Diagnosis not present

## 2021-09-25 DIAGNOSIS — I440 Atrioventricular block, first degree: Secondary | ICD-10-CM | POA: Diagnosis not present

## 2021-09-25 DIAGNOSIS — M75101 Unspecified rotator cuff tear or rupture of right shoulder, not specified as traumatic: Secondary | ICD-10-CM | POA: Diagnosis not present

## 2021-09-25 DIAGNOSIS — I679 Cerebrovascular disease, unspecified: Secondary | ICD-10-CM | POA: Diagnosis not present

## 2021-09-25 DIAGNOSIS — M6281 Muscle weakness (generalized): Secondary | ICD-10-CM | POA: Diagnosis not present

## 2021-09-25 DIAGNOSIS — R488 Other symbolic dysfunctions: Secondary | ICD-10-CM | POA: Diagnosis not present

## 2021-09-25 DIAGNOSIS — R52 Pain, unspecified: Secondary | ICD-10-CM | POA: Diagnosis not present

## 2021-09-25 DIAGNOSIS — I422 Other hypertrophic cardiomyopathy: Secondary | ICD-10-CM | POA: Diagnosis not present

## 2021-09-25 DIAGNOSIS — R2689 Other abnormalities of gait and mobility: Secondary | ICD-10-CM | POA: Diagnosis not present

## 2021-09-26 DIAGNOSIS — M75101 Unspecified rotator cuff tear or rupture of right shoulder, not specified as traumatic: Secondary | ICD-10-CM | POA: Diagnosis not present

## 2021-09-26 DIAGNOSIS — R296 Repeated falls: Secondary | ICD-10-CM | POA: Diagnosis not present

## 2021-09-26 DIAGNOSIS — M25511 Pain in right shoulder: Secondary | ICD-10-CM | POA: Diagnosis not present

## 2021-09-26 DIAGNOSIS — R488 Other symbolic dysfunctions: Secondary | ICD-10-CM | POA: Diagnosis not present

## 2021-09-26 DIAGNOSIS — I679 Cerebrovascular disease, unspecified: Secondary | ICD-10-CM | POA: Diagnosis not present

## 2021-09-26 DIAGNOSIS — M6281 Muscle weakness (generalized): Secondary | ICD-10-CM | POA: Diagnosis not present

## 2021-09-26 DIAGNOSIS — I440 Atrioventricular block, first degree: Secondary | ICD-10-CM | POA: Diagnosis not present

## 2021-09-26 DIAGNOSIS — R52 Pain, unspecified: Secondary | ICD-10-CM | POA: Diagnosis not present

## 2021-09-26 DIAGNOSIS — R2689 Other abnormalities of gait and mobility: Secondary | ICD-10-CM | POA: Diagnosis not present

## 2021-09-26 DIAGNOSIS — I422 Other hypertrophic cardiomyopathy: Secondary | ICD-10-CM | POA: Diagnosis not present

## 2021-09-27 DIAGNOSIS — I422 Other hypertrophic cardiomyopathy: Secondary | ICD-10-CM | POA: Diagnosis not present

## 2021-09-27 DIAGNOSIS — I440 Atrioventricular block, first degree: Secondary | ICD-10-CM | POA: Diagnosis not present

## 2021-09-27 DIAGNOSIS — R2689 Other abnormalities of gait and mobility: Secondary | ICD-10-CM | POA: Diagnosis not present

## 2021-09-27 DIAGNOSIS — M6281 Muscle weakness (generalized): Secondary | ICD-10-CM | POA: Diagnosis not present

## 2021-09-27 DIAGNOSIS — R296 Repeated falls: Secondary | ICD-10-CM | POA: Diagnosis not present

## 2021-09-27 DIAGNOSIS — M75101 Unspecified rotator cuff tear or rupture of right shoulder, not specified as traumatic: Secondary | ICD-10-CM | POA: Diagnosis not present

## 2021-09-27 DIAGNOSIS — R52 Pain, unspecified: Secondary | ICD-10-CM | POA: Diagnosis not present

## 2021-09-27 DIAGNOSIS — R488 Other symbolic dysfunctions: Secondary | ICD-10-CM | POA: Diagnosis not present

## 2021-09-27 DIAGNOSIS — I679 Cerebrovascular disease, unspecified: Secondary | ICD-10-CM | POA: Diagnosis not present

## 2021-09-27 DIAGNOSIS — M25511 Pain in right shoulder: Secondary | ICD-10-CM | POA: Diagnosis not present

## 2021-10-01 DIAGNOSIS — F0393 Unspecified dementia, unspecified severity, with mood disturbance: Secondary | ICD-10-CM | POA: Diagnosis not present

## 2021-10-02 DIAGNOSIS — I639 Cerebral infarction, unspecified: Secondary | ICD-10-CM | POA: Diagnosis not present

## 2021-10-02 DIAGNOSIS — I421 Obstructive hypertrophic cardiomyopathy: Secondary | ICD-10-CM | POA: Diagnosis not present

## 2021-10-02 DIAGNOSIS — M6281 Muscle weakness (generalized): Secondary | ICD-10-CM | POA: Diagnosis not present

## 2021-10-02 DIAGNOSIS — R2689 Other abnormalities of gait and mobility: Secondary | ICD-10-CM | POA: Diagnosis not present

## 2021-10-07 ENCOUNTER — Ambulatory Visit: Payer: Medicare Other | Admitting: Internal Medicine

## 2021-10-07 ENCOUNTER — Other Ambulatory Visit: Payer: Self-pay

## 2021-10-07 ENCOUNTER — Encounter: Payer: Self-pay | Admitting: Internal Medicine

## 2021-10-07 VITALS — BP 134/64 | HR 73 | Ht 65.5 in | Wt 120.0 lb

## 2021-10-07 DIAGNOSIS — I459 Conduction disorder, unspecified: Secondary | ICD-10-CM | POA: Insufficient documentation

## 2021-10-07 DIAGNOSIS — I421 Obstructive hypertrophic cardiomyopathy: Secondary | ICD-10-CM

## 2021-10-07 DIAGNOSIS — R55 Syncope and collapse: Secondary | ICD-10-CM | POA: Diagnosis not present

## 2021-10-07 NOTE — Patient Instructions (Addendum)
Medication Instructions:  ?Your physician recommends that you continue on your current medications as directed. Please refer to the Current Medication list given to you today. ? ?Labwork: ?None ordered. ? ?Testing/Procedures: ?None ordered. ? ?Follow-Up: ?Your physician wants you to follow-up in: one year with Anna [place], MD  ?You will receive a reminder letter in the mail two months in advance. If you don't receive a letter, please call our office to schedule the follow-up appointment. ? ? ?Any Other Special Instructions Will Be Listed Below (If Applicable). ? ?If you need a refill on your cardiac medications before your next appointment, please call your pharmacy.  ? ? ? ? ?

## 2021-10-07 NOTE — Progress Notes (Signed)
? ? ? ? ?HPI ?Ms. Mczeal returns today for followup. She is a pleasant 87 yo woman with a h/o HCM, HTN, CRI, and mostly AVWB. She has a remote h/o falls. I offered her PPM insertion over a year ago but she declined. She has had at least 2 significant infections since for which she was treated with anti-biotics and recovered. She denies chest pain or sob or edema.  ?Allergies  ?Allergen Reactions  ? Amoxicillin Diarrhea  ?  Has patient had a PCN reaction causing immediate rash, facial/tongue/throat swelling, SOB or lightheadedness with hypotension: no ?Has patient had a PCN reaction causing severe rash involving mucus membranes or skin necrosis: no ?Has patient had a PCN reaction that required hospitalization: no pharmacist consult ?Has patient had a PCN reaction occurring within the last 10 years: yes ?If all of the above answers are "NO", then may proceed with Cephalosporin use. ?  ? Cefepime Other (See Comments)  ?  Possible CNS toxicity 05/2021  ? Ampicillin Rash  ?  - Tolerates Rocephin and Ancef ?- Remote occurrence; no symptoms of anaphylaxis or severe cutaneous reaction, and no additional medical attention required ? ?  ? Sulfa Antibiotics Rash  ? ? ? ?Current Outpatient Medications  ?Medication Sig Dispense Refill  ? acetaminophen (TYLENOL) 325 MG tablet Take 650 mg by mouth every 6 (six) hours as needed for mild pain (Fever >/= 101).    ? amLODipine (NORVASC) 10 MG tablet Take 1 tablet (10 mg total) by mouth daily. 30 tablet 0  ? aspirin 81 MG EC tablet Take 1 tablet (81 mg total) by mouth daily. Swallow whole. 30 tablet 0  ? fexofenadine (ALLEGRA ALLERGY) 180 MG tablet Take 1 tablet (180 mg total) by mouth daily. 90 tablet 2  ? glucose blood test strip Use as directed to check blood sugars 1 time per day. 100 each 11  ? glucose monitoring kit (FREESTYLE) monitoring kit Use as directed to check blood sugars 1 time per day dx: e11.22 1 each 1  ? insulin aspart (NOVOLOG) 100 UNIT/ML FlexPen Inject into the  skin with meals per sliding scale. 151-200= 2 units, 201-250= 4 units, 251-300= 6 units, 301-350=8 units, 351-400=10 units, >401=10 units and notify MD 9 mL 0  ? Lancets (ONETOUCH DELICA PLUS [id number]) MISC Use to check blood sugar once daily. 100 each 12  ? metoprolol tartrate (LOPRESSOR) 25 MG tablet Take 0.5 tablets (12.5 mg total) by mouth 2 (two) times daily. 60 tablet 0  ? pantoprazole (PROTONIX) 40 MG tablet Take 1 tablet (40 mg total) by mouth 2 (two) times daily. 60 tablet 0  ? rosuvastatin (CRESTOR) 20 MG tablet Take 1 tablet (20 mg total) by mouth daily. 90 tablet 1  ? saccharomyces boulardii (FLORASTOR) 250 MG capsule Take 1 capsule (250 mg total) by mouth 2 (two) times daily. 60 capsule 0  ? ?Current Facility-Administered Medications  ?Medication Dose Route Frequency Provider Last Rate Last Admin  ? cyanocobalamin ((VITAMIN B-12)) injection 1,000 mcg  1,000 mcg Intramuscular Once Glendale Chard, MD      ? ? ? ?Past Medical History:  ?Diagnosis Date  ? Arthritis   ? Diabetes mellitus without complication ([place])   ? Hypertension   ? Hypokalemia   ? Pneumonia   ? Shingles   ? Stroke [facility])   ? ? ?ROS: ? ? All systems reviewed and negative except as noted in the HPI. ? ? ?Past Surgical History:  ?Procedure Laterality Date  ? ABDOMINAL HYSTERECTOMY    ?  BREAST EXCISIONAL BIOPSY Left   ? ESOPHAGOGASTRODUODENOSCOPY N/A 09/01/2014  ? Procedure: ESOPHAGOGASTRODUODENOSCOPY (EGD);  Surgeon: Lear Ng, MD;  Location: [facility];  Service: Endoscopy;  Laterality: N/A;  ? ESOPHAGOGASTRODUODENOSCOPY (EGD) WITH PROPOFOL N/A 10/12/2020  ? Procedure: ESOPHAGOGASTRODUODENOSCOPY (EGD) WITH PROPOFOL;  Surgeon: Mauri Pole, MD;  Location: [facility];  Service: Endoscopy;  Laterality: N/A;  ? HIATAL HERNIA REPAIR N/A 09/04/2014  ? Procedure: LAPAROSCOPIC REPAIR OF HIATAL HERNIA;  Surgeon: Excell Seltzer, MD;  Location: [facility];  Service: General;  Laterality: N/A;  With MESH  ? IR GENERIC HISTORICAL   04/10/2016  ? IR US GUIDE VASC ACCESS RIGHT 04/10/2016 Corrie Mckusick, DO [facility]  ? IR GENERIC HISTORICAL  04/10/2016  ? IR ANGIOGRAM SELECTIVE EACH ADDITIONAL VESSEL 04/10/2016 Corrie Mckusick, DO [facility]  ? IR GENERIC HISTORICAL  04/10/2016  ? IR ANGIOGRAM SELECTIVE EACH ADDITIONAL VESSEL 04/10/2016 Corrie Mckusick, DO [facility]  ? IR GENERIC HISTORICAL  04/10/2016  ? IR EMBO TUMOR ORGAN ISCHEMIA INFARCT INC GUIDE ROADMAPPING 04/10/2016 Corrie Mckusick, DO [facility]  ? IR GENERIC HISTORICAL  04/10/2016  ? IR ANGIOGRAM SELECTIVE EACH ADDITIONAL VESSEL 04/10/2016 Corrie Mckusick, DO [facility]  ? IR GENERIC HISTORICAL  04/10/2016  ? IR ANGIOGRAM SELECTIVE EACH ADDITIONAL VESSEL 04/10/2016 Corrie Mckusick, DO [facility]  ? IR GENERIC HISTORICAL  04/10/2016  ? IR RENAL SELECTIVE  UNI INC S&I MOD SED 04/10/2016 Corrie Mckusick, DO [facility]  ? IR GENERIC HISTORICAL  03/06/2016  ? IR RADIOLOGIST EVAL & MGMT 03/06/2016 Corrie Mckusick, DO [facility]  ? IR GENERIC HISTORICAL  03/26/2016  ? IR RADIOLOGIST EVAL & MGMT 03/26/2016 Corrie Mckusick, DO [facility]  ? IR GENERIC HISTORICAL  04/29/2016  ? IR RADIOLOGIST EVAL & MGMT 04/29/2016 [facility]  ? IR RADIOLOGIST EVAL & MGMT  11/12/2016  ? IR RADIOLOGIST EVAL & MGMT  12/16/2017  ? KNEE SURGERY    ? TONSILLECTOMY    ? ? ? ?Family History  ?Problem Relation Age of Onset  ? Alzheimer's disease Mother   ? Prostate cancer Father   ? Brain cancer Brother   ? Brain cancer Brother   ? Pancreatic cancer Sister   ? Allergic rhinitis Neg Hx   ? Asthma Neg Hx   ? Eczema Neg Hx   ? Urticaria Neg Hx   ? ? ? ?Social History  ? ?Socioeconomic History  ? Marital status: Widowed  ?  Spouse name: Not on file  ? Number of children: 2  ? Years of education: Not on file  ? Highest education level: Not on file  ?Occupational History  ? Occupation: retired  ?Tobacco Use  ? Smoking status: Never  ? Smokeless tobacco: Never  ?Vaping Use  ? Vaping Use: Never used  ?Substance and Sexual Activity  ? Alcohol use:  No  ? Drug use: No  ? Sexual activity: Not Currently  ?  Birth control/protection: Surgical  ?Other Topics Concern  ? Not on file  ?Social History Narrative  ? Son [name] lives with her  ? ?Social Determinants of Health  ? ?Financial Resource Strain: Low Risk   ? Difficulty of Paying Living Expenses: Not hard at all  ?Food Insecurity: No Food Insecurity  ? Worried About Charity fundraiser in the Last Year: Never true  ? Ran Out of Food in the Last Year: Never true  ?Transportation Needs: No Transportation Needs  ? Lack of Transportation (Medical): No  ? Lack  of Transportation (Non-Medical): No  ?Physical Activity: Sufficiently Active  ? Days of Exercise per Week: 6 days  ? Minutes of Exercise per Session: 30 min  ?Stress: No Stress Concern Present  ? Feeling of Stress : Not at all  ?Social Connections: Not on file  ?Intimate Partner Violence: Not on file  ? ? ? ?BP 134/64   Pulse 73   Ht 5' 5.5" (1.664 m)   Wt 120 lb (54.4 kg)   LMP  (LMP Unknown)   SpO2 93%   BMI 19.67 kg/m?  ? ?Physical Exam: ? ?Well appearing NAD ?HEENT: Unremarkable ?Neck:  No JVD, no thyromegally ?Lymphatics:  No adenopathy ?Back:  No CVA tenderness ?Lungs:  Clear with no wheezes ?HEART:  Regular rate rhythm, no murmurs, no rubs, no clicks ?Abd:  soft, positive bowel sounds, no organomegally, no rebound, no guarding ?Ext:  2 plus pulses, no edema, no cyanosis, no clubbing ?Skin:  No rashes no nodules ?Neuro:  CN II through XII intact, motor grossly intact ? ?EKG - nsr ? ? ?Assess/Plan:  ?AVWB - she is asymptomatic. She will undergo watchful waiting.  ?HCM - she is not obstructed and is asymptomatic. She is tolerating low dose metoprolol. ?Syncope - none recently. We will follow. ?HTN - her bp is good. Continue amlodipine. ? ?Carleene Overlie Hosanna Betley,MD ?

## 2021-10-08 DIAGNOSIS — N184 Chronic kidney disease, stage 4 (severe): Secondary | ICD-10-CM | POA: Diagnosis not present

## 2021-10-10 ENCOUNTER — Ambulatory Visit: Payer: Medicare Other | Admitting: Internal Medicine

## 2021-10-14 DIAGNOSIS — Z833 Family history of diabetes mellitus: Secondary | ICD-10-CM | POA: Diagnosis not present

## 2021-10-14 DIAGNOSIS — I6612 Occlusion and stenosis of left anterior cerebral artery: Secondary | ICD-10-CM | POA: Diagnosis not present

## 2021-10-14 DIAGNOSIS — R531 Weakness: Secondary | ICD-10-CM | POA: Diagnosis not present

## 2021-10-14 DIAGNOSIS — I1310 Hypertensive heart and chronic kidney disease without heart failure, with stage 1 through stage 4 chronic kidney disease, or unspecified chronic kidney disease: Secondary | ICD-10-CM | POA: Diagnosis not present

## 2021-10-14 DIAGNOSIS — R29704 NIHSS score 4: Secondary | ICD-10-CM | POA: Diagnosis not present

## 2021-10-14 DIAGNOSIS — M2578 Osteophyte, vertebrae: Secondary | ICD-10-CM | POA: Diagnosis not present

## 2021-10-14 DIAGNOSIS — M4802 Spinal stenosis, cervical region: Secondary | ICD-10-CM | POA: Diagnosis not present

## 2021-10-14 DIAGNOSIS — R2990 Unspecified symptoms and signs involving the nervous system: Secondary | ICD-10-CM | POA: Diagnosis not present

## 2021-10-14 DIAGNOSIS — E1122 Type 2 diabetes mellitus with diabetic chronic kidney disease: Secondary | ICD-10-CM | POA: Diagnosis not present

## 2021-10-14 DIAGNOSIS — E785 Hyperlipidemia, unspecified: Secondary | ICD-10-CM | POA: Diagnosis not present

## 2021-10-14 DIAGNOSIS — I639 Cerebral infarction, unspecified: Secondary | ICD-10-CM | POA: Diagnosis not present

## 2021-10-14 DIAGNOSIS — R29810 Facial weakness: Secondary | ICD-10-CM | POA: Diagnosis not present

## 2021-10-14 DIAGNOSIS — Z20822 Contact with and (suspected) exposure to covid-19: Secondary | ICD-10-CM | POA: Diagnosis not present

## 2021-10-14 DIAGNOSIS — I129 Hypertensive chronic kidney disease with stage 1 through stage 4 chronic kidney disease, or unspecified chronic kidney disease: Secondary | ICD-10-CM | POA: Diagnosis not present

## 2021-10-14 DIAGNOSIS — N183 Chronic kidney disease, stage 3 unspecified: Secondary | ICD-10-CM | POA: Diagnosis not present

## 2021-10-14 DIAGNOSIS — Z8673 Personal history of transient ischemic attack (TIA), and cerebral infarction without residual deficits: Secondary | ICD-10-CM | POA: Diagnosis not present

## 2021-10-14 DIAGNOSIS — I6603 Occlusion and stenosis of bilateral middle cerebral arteries: Secondary | ICD-10-CM | POA: Diagnosis not present

## 2021-10-14 DIAGNOSIS — I422 Other hypertrophic cardiomyopathy: Secondary | ICD-10-CM | POA: Diagnosis not present

## 2021-10-14 DIAGNOSIS — I6523 Occlusion and stenosis of bilateral carotid arteries: Secondary | ICD-10-CM | POA: Diagnosis not present

## 2021-10-14 DIAGNOSIS — R4781 Slurred speech: Secondary | ICD-10-CM | POA: Diagnosis not present

## 2021-10-14 DIAGNOSIS — G459 Transient cerebral ischemic attack, unspecified: Secondary | ICD-10-CM | POA: Diagnosis not present

## 2021-10-14 DIAGNOSIS — I700 Atherosclerosis of aorta: Secondary | ICD-10-CM | POA: Diagnosis not present

## 2021-10-14 DIAGNOSIS — M4712 Other spondylosis with myelopathy, cervical region: Secondary | ICD-10-CM | POA: Diagnosis not present

## 2021-10-14 DIAGNOSIS — I6623 Occlusion and stenosis of bilateral posterior cerebral arteries: Secondary | ICD-10-CM | POA: Diagnosis not present

## 2021-10-14 DIAGNOSIS — R55 Syncope and collapse: Secondary | ICD-10-CM | POA: Diagnosis not present

## 2021-10-14 DIAGNOSIS — E118 Type 2 diabetes mellitus with unspecified complications: Secondary | ICD-10-CM | POA: Diagnosis not present

## 2021-10-14 DIAGNOSIS — Z880 Allergy status to penicillin: Secondary | ICD-10-CM | POA: Diagnosis not present

## 2021-10-14 DIAGNOSIS — I517 Cardiomegaly: Secondary | ICD-10-CM | POA: Diagnosis not present

## 2021-10-14 DIAGNOSIS — G8194 Hemiplegia, unspecified affecting left nondominant side: Secondary | ICD-10-CM | POA: Diagnosis not present

## 2021-10-14 DIAGNOSIS — Z743 Need for continuous supervision: Secondary | ICD-10-CM | POA: Diagnosis not present

## 2021-10-14 DIAGNOSIS — R001 Bradycardia, unspecified: Secondary | ICD-10-CM | POA: Diagnosis not present

## 2021-10-14 DIAGNOSIS — Z794 Long term (current) use of insulin: Secondary | ICD-10-CM | POA: Diagnosis not present

## 2021-10-14 DIAGNOSIS — I340 Nonrheumatic mitral (valve) insufficiency: Secondary | ICD-10-CM | POA: Diagnosis not present

## 2021-10-14 DIAGNOSIS — I6501 Occlusion and stenosis of right vertebral artery: Secondary | ICD-10-CM | POA: Diagnosis not present

## 2021-10-29 DIAGNOSIS — F0393 Unspecified dementia, unspecified severity, with mood disturbance: Secondary | ICD-10-CM | POA: Diagnosis not present

## 2021-12-01 NOTE — Progress Notes (Signed)
?Cardiology Office Note:   ? ?Date:  12/06/2021  ? ?ID:  Anna Hoffman, DOB 08/11/1934, MRN [id number] ? ?PCP:  Pcp, No  ?Cardiologist:  Donato Heinz, MD  ?Electrophysiologist:  None  ? ?Referring MD: Glendale Chard, MD  ? ?Chief Complaint  ?Patient presents with  ? Cardiomyopathy  ? ? ?History of Present Illness:   ? ?Anna Hoffman is a 87 y.o. female with a hx of hypertrophic cardiomyopathy, CVA, T2DM, hypertension, hyperlipidemia who presents for follow-up.  She was hospitalized in August 2022 with syncopal episode.  Echocardiogram 04/01/2021 showed severe basal septal hypertrophy, hyperdynamic LV systolic function, normal RV function, mild AI and mild dilatation of ascending aorta measuring 41 mm.  Cardiac MRI on 04/03/2021 showed asymmetric LV hypertrophy measuring up to 19 mm and basal septum (11 mm and posterior wall) consistent with hypertrophic cardiomyopathy, patchy LGE at RV insertion site (LGE accounts for 2% of total myocardial mass), normal biventricular function.  Zio patch x2 weeks on 05/13/2021 showed 1 run of NSVT lasting 4 beats, Wenckebach.  She was seen by Dr. Lovena Le in EP on 05/10/2021, suspected syncope was due to heart block (had 2-1 AV block during admission in March 2022).  Suspected she would need pacemaker, but patient declined at this time.  Recommended considering loop recorder. ? ?Presented to [facility]/14/21 with slurred speech and LE weakness. Found to have acute CVA.  Discharged on ASA + plavix x 21 days then ASA 325 mg daily moving forward. Echo 60-65%.  LDL 45. Cardiac monitor recommended to assess for Afib.   ? ?Since discharge from the hospital, she reports she has been doing okay.  She has been doing physical therapy.  Denies any chest pain, dyspnea, or palpitations.  Does report some lightheadedness but denies any syncope.  She reports swelling in her legs. ? ? ? ?Past Medical History:  ?Diagnosis Date  ? Arthritis   ? Diabetes mellitus without complication ([place])   ?  Hypertension   ? Hypokalemia   ? Pneumonia   ? Shingles   ? Stroke [facility])   ? ? ?Past Surgical History:  ?Procedure Laterality Date  ? ABDOMINAL HYSTERECTOMY    ? BREAST EXCISIONAL BIOPSY Left   ? ESOPHAGOGASTRODUODENOSCOPY N/A 09/01/2014  ? Procedure: ESOPHAGOGASTRODUODENOSCOPY (EGD);  Surgeon: Lear Ng, MD;  Location: [facility];  Service: Endoscopy;  Laterality: N/A;  ? ESOPHAGOGASTRODUODENOSCOPY (EGD) WITH PROPOFOL N/A 10/12/2020  ? Procedure: ESOPHAGOGASTRODUODENOSCOPY (EGD) WITH PROPOFOL;  Surgeon: Mauri Pole, MD;  Location: [facility];  Service: Endoscopy;  Laterality: N/A;  ? HIATAL HERNIA REPAIR N/A 09/04/2014  ? Procedure: LAPAROSCOPIC REPAIR OF HIATAL HERNIA;  Surgeon: Excell Seltzer, MD;  Location: [facility];  Service: General;  Laterality: N/A;  With MESH  ? IR GENERIC HISTORICAL  04/10/2016  ? IR US GUIDE VASC ACCESS RIGHT 04/10/2016 Corrie Mckusick, DO [facility]  ? IR GENERIC HISTORICAL  04/10/2016  ? IR ANGIOGRAM SELECTIVE EACH ADDITIONAL VESSEL 04/10/2016 Corrie Mckusick, DO [facility]  ? IR GENERIC HISTORICAL  04/10/2016  ? IR ANGIOGRAM SELECTIVE EACH ADDITIONAL VESSEL 04/10/2016 Corrie Mckusick, DO [facility]  ? IR GENERIC HISTORICAL  04/10/2016  ? IR EMBO TUMOR ORGAN ISCHEMIA INFARCT INC GUIDE ROADMAPPING 04/10/2016 Corrie Mckusick, DO [facility]  ? IR GENERIC HISTORICAL  04/10/2016  ? IR ANGIOGRAM SELECTIVE EACH ADDITIONAL VESSEL 04/10/2016 Corrie Mckusick, DO [facility]  ? IR GENERIC HISTORICAL  04/10/2016  ? IR ANGIOGRAM SELECTIVE EACH ADDITIONAL VESSEL 04/10/2016 Corrie Mckusick, DO [facility]  ?  IR GENERIC HISTORICAL  04/10/2016  ? IR RENAL SELECTIVE  UNI INC S&I MOD SED 04/10/2016 Corrie Mckusick, DO [facility]  ? IR GENERIC HISTORICAL  03/06/2016  ? IR RADIOLOGIST EVAL & MGMT 03/06/2016 Corrie Mckusick, DO [facility]  ? IR GENERIC HISTORICAL  03/26/2016  ? IR RADIOLOGIST EVAL & MGMT 03/26/2016 Corrie Mckusick, DO [facility]  ? IR GENERIC HISTORICAL  04/29/2016  ? IR RADIOLOGIST EVAL & MGMT  04/29/2016 [facility]  ? IR RADIOLOGIST EVAL & MGMT  11/12/2016  ? IR RADIOLOGIST EVAL & MGMT  12/16/2017  ? KNEE SURGERY    ? TONSILLECTOMY    ? ? ?Current Medications: ?Current Meds  ?Medication Sig  ? acetaminophen (TYLENOL) 325 MG tablet Take 650 mg by mouth every 6 (six) hours as needed for mild pain (Fever >/= 101).  ? amLODipine (NORVASC) 10 MG tablet Take 1 tablet (10 mg total) by mouth daily.  ? aspirin 325 MG tablet Take 325 mg by mouth daily.  ? atorvastatin (LIPITOR) 10 MG tablet Take by mouth.  ? diclofenac Sodium (VOLTAREN) 1 % GEL Apply 1 application. topically 4 (four) times daily.  ? Docusate Sodium 100 MG capsule Take by mouth.  ? fexofenadine (ALLEGRA ALLERGY) 180 MG tablet Take 1 tablet (180 mg total) by mouth daily.  ? glucose blood test strip Use as directed to check blood sugars 1 time per day.  ? glucose monitoring kit (FREESTYLE) monitoring kit Use as directed to check blood sugars 1 time per day dx: e11.22  ? insulin aspart (NOVOLOG) 100 UNIT/ML FlexPen Inject into the skin with meals per sliding scale. 151-200= 2 units, 201-250= 4 units, 251-300= 6 units, 301-350=8 units, 351-400=10 units, >401=10 units and notify MD  ? Lancets (ONETOUCH DELICA PLUS [id number]) MISC Use to check blood sugar once daily.  ? meclizine (ANTIVERT) 25 MG tablet Take by mouth.  ? metoprolol tartrate (LOPRESSOR) 25 MG tablet Take 25 mg by mouth 2 (two) times daily.  ? pantoprazole (PROTONIX) 40 MG tablet Take 1 tablet (40 mg total) by mouth 2 (two) times daily.  ? sertraline (ZOLOFT) 25 MG tablet Take 25 mg by mouth daily.  ? sodium bicarbonate 650 MG tablet Take by mouth.  ? [DISCONTINUED] aspirin 81 MG EC tablet Take 1 tablet (81 mg total) by mouth daily. Swallow whole.  ? [DISCONTINUED] metoprolol succinate (TOPROL-XL) 50 MG 24 hr tablet Take by mouth.  ? [DISCONTINUED] metoprolol tartrate (LOPRESSOR) 25 MG tablet Take 0.5 tablets (12.5 mg total) by mouth 2 (two) times daily.  ? ?Current  Facility-Administered Medications for the 12/05/21 encounter (Office Visit) with Donato Heinz, MD  ?Medication  ? cyanocobalamin ((VITAMIN B-12)) injection 1,000 mcg  ?  ? ?Allergies:   Amoxicillin, Cefepime, Ampicillin, and Sulfa antibiotics  ? ?Social History  ? ?Socioeconomic History  ? Marital status: Widowed  ?  Spouse name: Not on file  ? Number of children: 2  ? Years of education: Not on file  ? Highest education level: Not on file  ?Occupational History  ? Occupation: retired  ?Tobacco Use  ? Smoking status: Never  ? Smokeless tobacco: Never  ?Vaping Use  ? Vaping Use: Never used  ?Substance and Sexual Activity  ? Alcohol use: No  ? Drug use: No  ? Sexual activity: Not Currently  ?  Birth control/protection: Surgical  ?Other Topics Concern  ? Not on file  ?Social History Narrative  ? Son [name] lives with her  ? ?Social  Determinants of Health  ? ?Financial Resource Strain: Low Risk   ? Difficulty of Paying Living Expenses: Not hard at all  ?Food Insecurity: No Food Insecurity  ? Worried About Charity fundraiser in the Last Year: Never true  ? Ran Out of Food in the Last Year: Never true  ?Transportation Needs: No Transportation Needs  ? Lack of Transportation (Medical): No  ? Lack of Transportation (Non-Medical): No  ?Physical Activity: Sufficiently Active  ? Days of Exercise per Week: 6 days  ? Minutes of Exercise per Session: 30 min  ?Stress: No Stress Concern Present  ? Feeling of Stress : Not at all  ?Social Connections: Not on file  ?  ? ?Family History: ?The patient's family history includes Alzheimer's disease in her mother; Brain cancer in her brother and brother; Pancreatic cancer in her sister; Prostate cancer in her father. There is no history of Allergic rhinitis, Asthma, Eczema, or Urticaria. ? ?ROS:   ?Please see the history of present illness.    ? All other systems reviewed and are negative. ? ?EKGs/Labs/Other Studies Reviewed:   ? ?The following studies were reviewed  today: ? ? ?EKG:   ?12/05/2021: Normal sinus rhythm, first-degree AV block, LVH, rate 66 ? ?Recent Labs: ?07/13/2021: TSH 1.098 ?08/25/2021: ALT 12 ?08/27/2021: Magnesium 2.2 ?08/28/2021: BUN 22; Creatinine, Ser 1.57; Hemoglobin 9.4; Platelets 310;

## 2021-12-05 ENCOUNTER — Encounter: Payer: Self-pay | Admitting: Cardiology

## 2021-12-05 ENCOUNTER — Other Ambulatory Visit: Payer: Self-pay | Admitting: Cardiology

## 2021-12-05 ENCOUNTER — Ambulatory Visit (INDEPENDENT_AMBULATORY_CARE_PROVIDER_SITE_OTHER): Payer: Medicare Other

## 2021-12-05 ENCOUNTER — Ambulatory Visit: Payer: Medicare Other | Admitting: Cardiology

## 2021-12-05 VITALS — BP 148/72 | HR 66 | Ht 65.05 in | Wt 120.0 lb

## 2021-12-05 DIAGNOSIS — I639 Cerebral infarction, unspecified: Secondary | ICD-10-CM

## 2021-12-05 DIAGNOSIS — I422 Other hypertrophic cardiomyopathy: Secondary | ICD-10-CM

## 2021-12-05 DIAGNOSIS — I4891 Unspecified atrial fibrillation: Secondary | ICD-10-CM

## 2021-12-05 DIAGNOSIS — I10 Essential (primary) hypertension: Secondary | ICD-10-CM

## 2021-12-05 DIAGNOSIS — R600 Localized edema: Secondary | ICD-10-CM

## 2021-12-05 NOTE — Progress Notes (Unsigned)
Enrolled for [employer] to mail a ZIO XT long term holter monitor to [address], [place] , [place]  [id number] ?

## 2021-12-05 NOTE — Patient Instructions (Signed)
Medication Instructions:  ?Your physician recommends that you continue on your current medications as directed. Please refer to the Current Medication list given to you today. ? ?*If you need a refill on your cardiac medications before your next appointment, please call your pharmacy* ? ? ?Lab Work: ?CMET today ? ?If you have labs (blood work) drawn today and your tests are completely normal, you will receive your results only by: ?MyChart Message (if you have MyChart) OR ?A paper copy in the mail ?If you have any lab test that is abnormal or we need to change your treatment, we will call you to review the results. ? ? ?Testing/Procedures: ?Your physician has requested that you have a lower extremity venous duplex. This test is an ultrasound of the veins in the legs. It looks at venous blood flow that carries blood from the heart to the legs. Allow one hour for a Lower Venous exam. There are no restrictions or special instructions. ? ?ZIO XT- Long Term Monitor Instructions  ? ?Your physician has requested you wear a ZIO patch monitor for _14_ days.  ?This is a single patch monitor.   [employer] supplies one patch monitor per enrollment. Additional stickers are not available. Please do not apply patch if you will be having a Nuclear Stress Test, Echocardiogram, Cardiac CT, MRI, or Chest Xray during the period you would be wearing the monitor. The patch cannot be worn during these tests. You cannot remove and re-apply the ZIO XT patch monitor.  ?Your ZIO patch monitor will be sent Fed Ex from [employer] directly to your home address. It may take 3-5 days to receive your monitor after you have been enrolled.  ?Once you have received your monitor, please review the enclosed instructions. Your monitor has already been registered assigning a specific monitor serial # to you. ? ?Billing and Patient Assistance Program Information  ? ?We have supplied [employer] with any of your insurance information on file for billing  purposes. ?[employer] offers a sliding scale Patient Assistance Program for patients that do not have insurance, or whose insurance does not completely cover the cost of the ZIO monitor.   You must apply for the Patient Assistance Program to qualify for this discounted rate.     To apply, please call [employer] at [phone], select option 4, then select option 2, and ask to apply for Patient Assistance Program.  Theodore Demark will ask your household income, and how many people are in your household.  They will quote your out-of-pocket cost based on that information.  [employer] will also be able to set up a 13-month interest-free payment plan if needed. ? ?Applying the monitor  ? ?Shave hair from upper left chest.  ?Hold abrader disc by orange tab. Rub abrader in 40 strokes over the upper left chest as indicated in your monitor instructions.  ?Clean area with 4 enclosed alcohol pads. Let dry.  ?Apply patch as indicated in monitor instructions. Patch will be placed under collarbone on left side of chest with arrow pointing upward.  ?Rub patch adhesive wings for 2 minutes. Remove white label marked "1". Remove the white label marked "2". Rub patch adhesive wings for 2 additional minutes.  ?While looking in a mirror, press and release button in center of patch. A small green light will flash 3-4 times. This will be your only indicator that the monitor has been turned on. ?  ?Do not shower for the first 24 hours. You may shower after the first 24 hours.  ?  Press the button if you feel a symptom. You will hear a small click. Record Date, Time and Symptom in the Patient Logbook.  ?When you are ready to remove the patch, follow instructions on the last 2 pages of the Patient Logbook. Stick patch monitor onto the last page of Patient Logbook.  ?Place Patient Logbook in the blue and white box.  Use locking tab on box and tape box closed securely.  The blue and white box has prepaid postage on it. Please place it in the mailbox as soon  as possible. Your physician should have your test results approximately 7 days after the monitor has been mailed back to [facility].  ?Call [facility] at [phone] if you have questions regarding your ZIO XT patch monitor. Call them immediately if you see an orange light blinking on your monitor.  ?If your monitor falls off in less than 4 days, contact our Monitor department at [phone]. ?If your monitor becomes loose or falls off after 4 days call [employer] at [phone] for suggestions on securing your monitor.? ? ? ? ?Follow-Up: ?At [facility], you and your health needs are our priority.  As part of our continuing mission to provide you with exceptional heart care, we have created designated Provider Care Teams.  These Care Teams include your primary Cardiologist (physician) and Advanced Practice Providers (APPs -  Physician Assistants and Nurse Practitioners) who all work together to provide you with the care you need, when you need it. ? ?We recommend signing up for the patient portal called "MyChart".  Sign up information is provided on this After Visit Summary.  MyChart is used to connect with patients for Virtual Visits (Telemedicine).  Patients are able to view lab/test results, encounter notes, upcoming appointments, etc.  Non-urgent messages can be sent to your provider as well.   ?To learn more about what you can do with MyChart, go to [email or web address].   ? ?Your next appointment:   ?3 month(s) with NP/PA ?6 months with Dr. Gardiner Rhyme ? ?Important Information About Sugar ? ? ? ? ? ? ?

## 2021-12-21 DIAGNOSIS — I422 Other hypertrophic cardiomyopathy: Secondary | ICD-10-CM

## 2021-12-21 DIAGNOSIS — I639 Cerebral infarction, unspecified: Secondary | ICD-10-CM | POA: Diagnosis not present

## 2021-12-21 DIAGNOSIS — I4891 Unspecified atrial fibrillation: Secondary | ICD-10-CM

## 2021-12-26 ENCOUNTER — Encounter (HOSPITAL_COMMUNITY): Payer: Medicare Other

## 2022-01-10 ENCOUNTER — Ambulatory Visit (HOSPITAL_COMMUNITY)
Admission: RE | Admit: 2022-01-10 | Discharge: 2022-01-10 | Disposition: A | Discharge status: Home / Self Care | Payer: Medicare Other | Source: Ambulatory Visit | Attending: Cardiology | Admitting: Cardiology

## 2022-01-10 DIAGNOSIS — R600 Localized edema: Secondary | ICD-10-CM | POA: Diagnosis present

## 2022-02-01 DIAGNOSIS — I421 Obstructive hypertrophic cardiomyopathy: Secondary | ICD-10-CM | POA: Diagnosis not present

## 2022-02-01 DIAGNOSIS — I639 Cerebral infarction, unspecified: Secondary | ICD-10-CM | POA: Diagnosis not present

## 2022-02-01 DIAGNOSIS — M6281 Muscle weakness (generalized): Secondary | ICD-10-CM | POA: Diagnosis not present

## 2022-02-01 DIAGNOSIS — R2689 Other abnormalities of gait and mobility: Secondary | ICD-10-CM | POA: Diagnosis not present

## 2022-02-12 DIAGNOSIS — N189 Chronic kidney disease, unspecified: Secondary | ICD-10-CM | POA: Diagnosis not present

## 2022-02-12 DIAGNOSIS — N184 Chronic kidney disease, stage 4 (severe): Secondary | ICD-10-CM | POA: Diagnosis not present

## 2022-02-12 DIAGNOSIS — I421 Obstructive hypertrophic cardiomyopathy: Secondary | ICD-10-CM | POA: Diagnosis not present

## 2022-02-12 DIAGNOSIS — D649 Anemia, unspecified: Secondary | ICD-10-CM | POA: Diagnosis not present

## 2022-02-12 DIAGNOSIS — E1169 Type 2 diabetes mellitus with other specified complication: Secondary | ICD-10-CM | POA: Diagnosis not present

## 2022-02-12 DIAGNOSIS — E872 Acidosis, unspecified: Secondary | ICD-10-CM | POA: Diagnosis not present

## 2022-02-12 DIAGNOSIS — R55 Syncope and collapse: Secondary | ICD-10-CM | POA: Diagnosis not present

## 2022-02-12 DIAGNOSIS — E211 Secondary hyperparathyroidism, not elsewhere classified: Secondary | ICD-10-CM | POA: Diagnosis not present

## 2022-02-12 DIAGNOSIS — R309 Painful micturition, unspecified: Secondary | ICD-10-CM | POA: Diagnosis not present

## 2022-02-12 DIAGNOSIS — I10 Essential (primary) hypertension: Secondary | ICD-10-CM | POA: Diagnosis not present

## 2022-02-12 DIAGNOSIS — E559 Vitamin D deficiency, unspecified: Secondary | ICD-10-CM | POA: Diagnosis not present

## 2022-02-12 DIAGNOSIS — N390 Urinary tract infection, site not specified: Secondary | ICD-10-CM | POA: Diagnosis not present

## 2022-02-12 DIAGNOSIS — R809 Proteinuria, unspecified: Secondary | ICD-10-CM | POA: Diagnosis not present

## 2022-02-12 DIAGNOSIS — N281 Cyst of kidney, acquired: Secondary | ICD-10-CM | POA: Diagnosis not present

## 2022-02-17 DIAGNOSIS — H905 Unspecified sensorineural hearing loss: Secondary | ICD-10-CM | POA: Diagnosis not present

## 2022-02-18 DIAGNOSIS — F0153 Vascular dementia, unspecified severity, with mood disturbance: Secondary | ICD-10-CM | POA: Diagnosis not present

## 2022-02-18 DIAGNOSIS — N390 Urinary tract infection, site not specified: Secondary | ICD-10-CM | POA: Diagnosis not present

## 2022-02-21 DIAGNOSIS — B9689 Other specified bacterial agents as the cause of diseases classified elsewhere: Secondary | ICD-10-CM | POA: Diagnosis not present

## 2022-02-21 DIAGNOSIS — I10 Essential (primary) hypertension: Secondary | ICD-10-CM | POA: Diagnosis not present

## 2022-02-21 DIAGNOSIS — N390 Urinary tract infection, site not specified: Secondary | ICD-10-CM | POA: Diagnosis not present

## 2022-02-21 DIAGNOSIS — N1831 Chronic kidney disease, stage 3a: Secondary | ICD-10-CM | POA: Diagnosis not present

## 2022-02-25 DIAGNOSIS — R55 Syncope and collapse: Secondary | ICD-10-CM | POA: Diagnosis not present

## 2022-02-25 DIAGNOSIS — R52 Pain, unspecified: Secondary | ICD-10-CM | POA: Diagnosis not present

## 2022-02-25 DIAGNOSIS — Z741 Need for assistance with personal care: Secondary | ICD-10-CM | POA: Diagnosis not present

## 2022-02-25 DIAGNOSIS — M1990 Unspecified osteoarthritis, unspecified site: Secondary | ICD-10-CM | POA: Diagnosis not present

## 2022-02-25 DIAGNOSIS — M4692 Unspecified inflammatory spondylopathy, cervical region: Secondary | ICD-10-CM | POA: Diagnosis not present

## 2022-02-25 DIAGNOSIS — E1122 Type 2 diabetes mellitus with diabetic chronic kidney disease: Secondary | ICD-10-CM | POA: Diagnosis not present

## 2022-02-25 DIAGNOSIS — R1310 Dysphagia, unspecified: Secondary | ICD-10-CM | POA: Diagnosis not present

## 2022-02-25 DIAGNOSIS — R2689 Other abnormalities of gait and mobility: Secondary | ICD-10-CM | POA: Diagnosis not present

## 2022-02-25 DIAGNOSIS — M4699 Unspecified inflammatory spondylopathy, multiple sites in spine: Secondary | ICD-10-CM | POA: Diagnosis not present

## 2022-02-25 DIAGNOSIS — R279 Unspecified lack of coordination: Secondary | ICD-10-CM | POA: Diagnosis not present

## 2022-02-25 DIAGNOSIS — I7121 Aneurysm of the ascending aorta, without rupture: Secondary | ICD-10-CM | POA: Diagnosis not present

## 2022-02-25 DIAGNOSIS — I69314 Frontal lobe and executive function deficit following cerebral infarction: Secondary | ICD-10-CM | POA: Diagnosis not present

## 2022-02-25 DIAGNOSIS — M6281 Muscle weakness (generalized): Secondary | ICD-10-CM | POA: Diagnosis not present

## 2022-02-25 DIAGNOSIS — I639 Cerebral infarction, unspecified: Secondary | ICD-10-CM | POA: Diagnosis not present

## 2022-02-26 DIAGNOSIS — I7121 Aneurysm of the ascending aorta, without rupture: Secondary | ICD-10-CM | POA: Diagnosis not present

## 2022-02-26 DIAGNOSIS — M4692 Unspecified inflammatory spondylopathy, cervical region: Secondary | ICD-10-CM | POA: Diagnosis not present

## 2022-02-26 DIAGNOSIS — R52 Pain, unspecified: Secondary | ICD-10-CM | POA: Diagnosis not present

## 2022-02-26 DIAGNOSIS — Z741 Need for assistance with personal care: Secondary | ICD-10-CM | POA: Diagnosis not present

## 2022-02-26 DIAGNOSIS — M4699 Unspecified inflammatory spondylopathy, multiple sites in spine: Secondary | ICD-10-CM | POA: Diagnosis not present

## 2022-02-26 DIAGNOSIS — R55 Syncope and collapse: Secondary | ICD-10-CM | POA: Diagnosis not present

## 2022-02-26 DIAGNOSIS — E1122 Type 2 diabetes mellitus with diabetic chronic kidney disease: Secondary | ICD-10-CM | POA: Diagnosis not present

## 2022-02-26 DIAGNOSIS — R1310 Dysphagia, unspecified: Secondary | ICD-10-CM | POA: Diagnosis not present

## 2022-02-26 DIAGNOSIS — I10 Essential (primary) hypertension: Secondary | ICD-10-CM | POA: Diagnosis not present

## 2022-02-26 DIAGNOSIS — M6281 Muscle weakness (generalized): Secondary | ICD-10-CM | POA: Diagnosis not present

## 2022-02-26 DIAGNOSIS — M1990 Unspecified osteoarthritis, unspecified site: Secondary | ICD-10-CM | POA: Diagnosis not present

## 2022-02-26 DIAGNOSIS — I639 Cerebral infarction, unspecified: Secondary | ICD-10-CM | POA: Diagnosis not present

## 2022-02-26 DIAGNOSIS — I69314 Frontal lobe and executive function deficit following cerebral infarction: Secondary | ICD-10-CM | POA: Diagnosis not present

## 2022-02-26 DIAGNOSIS — R279 Unspecified lack of coordination: Secondary | ICD-10-CM | POA: Diagnosis not present

## 2022-02-26 DIAGNOSIS — R2689 Other abnormalities of gait and mobility: Secondary | ICD-10-CM | POA: Diagnosis not present

## 2022-02-26 DIAGNOSIS — N184 Chronic kidney disease, stage 4 (severe): Secondary | ICD-10-CM | POA: Diagnosis not present

## 2022-02-26 DIAGNOSIS — J309 Allergic rhinitis, unspecified: Secondary | ICD-10-CM | POA: Diagnosis not present

## 2022-02-27 DIAGNOSIS — R55 Syncope and collapse: Secondary | ICD-10-CM | POA: Diagnosis not present

## 2022-02-27 DIAGNOSIS — I69314 Frontal lobe and executive function deficit following cerebral infarction: Secondary | ICD-10-CM | POA: Diagnosis not present

## 2022-02-27 DIAGNOSIS — M4699 Unspecified inflammatory spondylopathy, multiple sites in spine: Secondary | ICD-10-CM | POA: Diagnosis not present

## 2022-02-27 DIAGNOSIS — M4692 Unspecified inflammatory spondylopathy, cervical region: Secondary | ICD-10-CM | POA: Diagnosis not present

## 2022-02-27 DIAGNOSIS — E1122 Type 2 diabetes mellitus with diabetic chronic kidney disease: Secondary | ICD-10-CM | POA: Diagnosis not present

## 2022-02-27 DIAGNOSIS — I639 Cerebral infarction, unspecified: Secondary | ICD-10-CM | POA: Diagnosis not present

## 2022-02-27 DIAGNOSIS — R52 Pain, unspecified: Secondary | ICD-10-CM | POA: Diagnosis not present

## 2022-02-27 DIAGNOSIS — R2689 Other abnormalities of gait and mobility: Secondary | ICD-10-CM | POA: Diagnosis not present

## 2022-02-27 DIAGNOSIS — M6281 Muscle weakness (generalized): Secondary | ICD-10-CM | POA: Diagnosis not present

## 2022-02-27 DIAGNOSIS — R279 Unspecified lack of coordination: Secondary | ICD-10-CM | POA: Diagnosis not present

## 2022-02-27 DIAGNOSIS — Z741 Need for assistance with personal care: Secondary | ICD-10-CM | POA: Diagnosis not present

## 2022-02-27 DIAGNOSIS — I7121 Aneurysm of the ascending aorta, without rupture: Secondary | ICD-10-CM | POA: Diagnosis not present

## 2022-02-27 DIAGNOSIS — R1310 Dysphagia, unspecified: Secondary | ICD-10-CM | POA: Diagnosis not present

## 2022-02-27 DIAGNOSIS — M1990 Unspecified osteoarthritis, unspecified site: Secondary | ICD-10-CM | POA: Diagnosis not present

## 2022-02-28 DIAGNOSIS — I7121 Aneurysm of the ascending aorta, without rupture: Secondary | ICD-10-CM | POA: Diagnosis not present

## 2022-02-28 DIAGNOSIS — M6281 Muscle weakness (generalized): Secondary | ICD-10-CM | POA: Diagnosis not present

## 2022-02-28 DIAGNOSIS — I69314 Frontal lobe and executive function deficit following cerebral infarction: Secondary | ICD-10-CM | POA: Diagnosis not present

## 2022-02-28 DIAGNOSIS — R1310 Dysphagia, unspecified: Secondary | ICD-10-CM | POA: Diagnosis not present

## 2022-02-28 DIAGNOSIS — I639 Cerebral infarction, unspecified: Secondary | ICD-10-CM | POA: Diagnosis not present

## 2022-02-28 DIAGNOSIS — R52 Pain, unspecified: Secondary | ICD-10-CM | POA: Diagnosis not present

## 2022-02-28 DIAGNOSIS — Z741 Need for assistance with personal care: Secondary | ICD-10-CM | POA: Diagnosis not present

## 2022-02-28 DIAGNOSIS — R279 Unspecified lack of coordination: Secondary | ICD-10-CM | POA: Diagnosis not present

## 2022-02-28 DIAGNOSIS — E1122 Type 2 diabetes mellitus with diabetic chronic kidney disease: Secondary | ICD-10-CM | POA: Diagnosis not present

## 2022-02-28 DIAGNOSIS — M4699 Unspecified inflammatory spondylopathy, multiple sites in spine: Secondary | ICD-10-CM | POA: Diagnosis not present

## 2022-02-28 DIAGNOSIS — R2689 Other abnormalities of gait and mobility: Secondary | ICD-10-CM | POA: Diagnosis not present

## 2022-02-28 DIAGNOSIS — M4692 Unspecified inflammatory spondylopathy, cervical region: Secondary | ICD-10-CM | POA: Diagnosis not present

## 2022-02-28 DIAGNOSIS — R55 Syncope and collapse: Secondary | ICD-10-CM | POA: Diagnosis not present

## 2022-02-28 DIAGNOSIS — M1990 Unspecified osteoarthritis, unspecified site: Secondary | ICD-10-CM | POA: Diagnosis not present

## 2022-03-03 DIAGNOSIS — I639 Cerebral infarction, unspecified: Secondary | ICD-10-CM | POA: Diagnosis not present

## 2022-03-03 DIAGNOSIS — M4699 Unspecified inflammatory spondylopathy, multiple sites in spine: Secondary | ICD-10-CM | POA: Diagnosis not present

## 2022-03-03 DIAGNOSIS — R2689 Other abnormalities of gait and mobility: Secondary | ICD-10-CM | POA: Diagnosis not present

## 2022-03-03 DIAGNOSIS — I7121 Aneurysm of the ascending aorta, without rupture: Secondary | ICD-10-CM | POA: Diagnosis not present

## 2022-03-03 DIAGNOSIS — J40 Bronchitis, not specified as acute or chronic: Secondary | ICD-10-CM | POA: Diagnosis not present

## 2022-03-03 DIAGNOSIS — M4692 Unspecified inflammatory spondylopathy, cervical region: Secondary | ICD-10-CM | POA: Diagnosis not present

## 2022-03-03 DIAGNOSIS — M1990 Unspecified osteoarthritis, unspecified site: Secondary | ICD-10-CM | POA: Diagnosis not present

## 2022-03-03 DIAGNOSIS — Z741 Need for assistance with personal care: Secondary | ICD-10-CM | POA: Diagnosis not present

## 2022-03-03 DIAGNOSIS — M6281 Muscle weakness (generalized): Secondary | ICD-10-CM | POA: Diagnosis not present

## 2022-03-03 DIAGNOSIS — R55 Syncope and collapse: Secondary | ICD-10-CM | POA: Diagnosis not present

## 2022-03-03 DIAGNOSIS — R279 Unspecified lack of coordination: Secondary | ICD-10-CM | POA: Diagnosis not present

## 2022-03-03 DIAGNOSIS — R1310 Dysphagia, unspecified: Secondary | ICD-10-CM | POA: Diagnosis not present

## 2022-03-03 DIAGNOSIS — R52 Pain, unspecified: Secondary | ICD-10-CM | POA: Diagnosis not present

## 2022-03-03 DIAGNOSIS — E1122 Type 2 diabetes mellitus with diabetic chronic kidney disease: Secondary | ICD-10-CM | POA: Diagnosis not present

## 2022-03-03 DIAGNOSIS — I69314 Frontal lobe and executive function deficit following cerebral infarction: Secondary | ICD-10-CM | POA: Diagnosis not present

## 2022-03-04 DIAGNOSIS — I7121 Aneurysm of the ascending aorta, without rupture: Secondary | ICD-10-CM | POA: Diagnosis not present

## 2022-03-04 DIAGNOSIS — I10 Essential (primary) hypertension: Secondary | ICD-10-CM | POA: Diagnosis not present

## 2022-03-04 DIAGNOSIS — I639 Cerebral infarction, unspecified: Secondary | ICD-10-CM | POA: Diagnosis not present

## 2022-03-04 DIAGNOSIS — M4692 Unspecified inflammatory spondylopathy, cervical region: Secondary | ICD-10-CM | POA: Diagnosis not present

## 2022-03-04 DIAGNOSIS — Z741 Need for assistance with personal care: Secondary | ICD-10-CM | POA: Diagnosis not present

## 2022-03-04 DIAGNOSIS — R2689 Other abnormalities of gait and mobility: Secondary | ICD-10-CM | POA: Diagnosis not present

## 2022-03-04 DIAGNOSIS — M1990 Unspecified osteoarthritis, unspecified site: Secondary | ICD-10-CM | POA: Diagnosis not present

## 2022-03-04 DIAGNOSIS — I421 Obstructive hypertrophic cardiomyopathy: Secondary | ICD-10-CM | POA: Diagnosis not present

## 2022-03-04 DIAGNOSIS — R1310 Dysphagia, unspecified: Secondary | ICD-10-CM | POA: Diagnosis not present

## 2022-03-04 DIAGNOSIS — E1122 Type 2 diabetes mellitus with diabetic chronic kidney disease: Secondary | ICD-10-CM | POA: Diagnosis not present

## 2022-03-04 DIAGNOSIS — R278 Other lack of coordination: Secondary | ICD-10-CM | POA: Diagnosis not present

## 2022-03-04 DIAGNOSIS — M4699 Unspecified inflammatory spondylopathy, multiple sites in spine: Secondary | ICD-10-CM | POA: Diagnosis not present

## 2022-03-04 DIAGNOSIS — M6281 Muscle weakness (generalized): Secondary | ICD-10-CM | POA: Diagnosis not present

## 2022-03-04 DIAGNOSIS — R293 Abnormal posture: Secondary | ICD-10-CM | POA: Diagnosis not present

## 2022-03-04 DIAGNOSIS — R52 Pain, unspecified: Secondary | ICD-10-CM | POA: Diagnosis not present

## 2022-03-04 DIAGNOSIS — I69354 Hemiplegia and hemiparesis following cerebral infarction affecting left non-dominant side: Secondary | ICD-10-CM | POA: Diagnosis not present

## 2022-03-05 DIAGNOSIS — I69354 Hemiplegia and hemiparesis following cerebral infarction affecting left non-dominant side: Secondary | ICD-10-CM | POA: Diagnosis not present

## 2022-03-05 DIAGNOSIS — R1310 Dysphagia, unspecified: Secondary | ICD-10-CM | POA: Diagnosis not present

## 2022-03-05 DIAGNOSIS — Z741 Need for assistance with personal care: Secondary | ICD-10-CM | POA: Diagnosis not present

## 2022-03-05 DIAGNOSIS — E1122 Type 2 diabetes mellitus with diabetic chronic kidney disease: Secondary | ICD-10-CM | POA: Diagnosis not present

## 2022-03-05 DIAGNOSIS — R278 Other lack of coordination: Secondary | ICD-10-CM | POA: Diagnosis not present

## 2022-03-05 DIAGNOSIS — R52 Pain, unspecified: Secondary | ICD-10-CM | POA: Diagnosis not present

## 2022-03-05 DIAGNOSIS — M1990 Unspecified osteoarthritis, unspecified site: Secondary | ICD-10-CM | POA: Diagnosis not present

## 2022-03-05 DIAGNOSIS — I639 Cerebral infarction, unspecified: Secondary | ICD-10-CM | POA: Diagnosis not present

## 2022-03-05 DIAGNOSIS — M4692 Unspecified inflammatory spondylopathy, cervical region: Secondary | ICD-10-CM | POA: Diagnosis not present

## 2022-03-05 DIAGNOSIS — M4699 Unspecified inflammatory spondylopathy, multiple sites in spine: Secondary | ICD-10-CM | POA: Diagnosis not present

## 2022-03-05 DIAGNOSIS — M6281 Muscle weakness (generalized): Secondary | ICD-10-CM | POA: Diagnosis not present

## 2022-03-05 DIAGNOSIS — R2689 Other abnormalities of gait and mobility: Secondary | ICD-10-CM | POA: Diagnosis not present

## 2022-03-05 DIAGNOSIS — I7121 Aneurysm of the ascending aorta, without rupture: Secondary | ICD-10-CM | POA: Diagnosis not present

## 2022-03-05 DIAGNOSIS — R293 Abnormal posture: Secondary | ICD-10-CM | POA: Diagnosis not present

## 2022-03-06 DIAGNOSIS — R293 Abnormal posture: Secondary | ICD-10-CM | POA: Diagnosis not present

## 2022-03-06 DIAGNOSIS — R52 Pain, unspecified: Secondary | ICD-10-CM | POA: Diagnosis not present

## 2022-03-06 DIAGNOSIS — I7121 Aneurysm of the ascending aorta, without rupture: Secondary | ICD-10-CM | POA: Diagnosis not present

## 2022-03-06 DIAGNOSIS — M4692 Unspecified inflammatory spondylopathy, cervical region: Secondary | ICD-10-CM | POA: Diagnosis not present

## 2022-03-06 DIAGNOSIS — M4699 Unspecified inflammatory spondylopathy, multiple sites in spine: Secondary | ICD-10-CM | POA: Diagnosis not present

## 2022-03-06 DIAGNOSIS — E1122 Type 2 diabetes mellitus with diabetic chronic kidney disease: Secondary | ICD-10-CM | POA: Diagnosis not present

## 2022-03-06 DIAGNOSIS — I639 Cerebral infarction, unspecified: Secondary | ICD-10-CM | POA: Diagnosis not present

## 2022-03-06 DIAGNOSIS — R1310 Dysphagia, unspecified: Secondary | ICD-10-CM | POA: Diagnosis not present

## 2022-03-06 DIAGNOSIS — I69354 Hemiplegia and hemiparesis following cerebral infarction affecting left non-dominant side: Secondary | ICD-10-CM | POA: Diagnosis not present

## 2022-03-06 DIAGNOSIS — M1990 Unspecified osteoarthritis, unspecified site: Secondary | ICD-10-CM | POA: Diagnosis not present

## 2022-03-06 DIAGNOSIS — R278 Other lack of coordination: Secondary | ICD-10-CM | POA: Diagnosis not present

## 2022-03-06 DIAGNOSIS — M6281 Muscle weakness (generalized): Secondary | ICD-10-CM | POA: Diagnosis not present

## 2022-03-06 DIAGNOSIS — Z741 Need for assistance with personal care: Secondary | ICD-10-CM | POA: Diagnosis not present

## 2022-03-06 DIAGNOSIS — R2689 Other abnormalities of gait and mobility: Secondary | ICD-10-CM | POA: Diagnosis not present

## 2022-03-07 DIAGNOSIS — R278 Other lack of coordination: Secondary | ICD-10-CM | POA: Diagnosis not present

## 2022-03-07 DIAGNOSIS — I7121 Aneurysm of the ascending aorta, without rupture: Secondary | ICD-10-CM | POA: Diagnosis not present

## 2022-03-07 DIAGNOSIS — R2689 Other abnormalities of gait and mobility: Secondary | ICD-10-CM | POA: Diagnosis not present

## 2022-03-07 DIAGNOSIS — Z741 Need for assistance with personal care: Secondary | ICD-10-CM | POA: Diagnosis not present

## 2022-03-07 DIAGNOSIS — M6281 Muscle weakness (generalized): Secondary | ICD-10-CM | POA: Diagnosis not present

## 2022-03-07 DIAGNOSIS — M4692 Unspecified inflammatory spondylopathy, cervical region: Secondary | ICD-10-CM | POA: Diagnosis not present

## 2022-03-07 DIAGNOSIS — R52 Pain, unspecified: Secondary | ICD-10-CM | POA: Diagnosis not present

## 2022-03-07 DIAGNOSIS — E1122 Type 2 diabetes mellitus with diabetic chronic kidney disease: Secondary | ICD-10-CM | POA: Diagnosis not present

## 2022-03-07 DIAGNOSIS — M4699 Unspecified inflammatory spondylopathy, multiple sites in spine: Secondary | ICD-10-CM | POA: Diagnosis not present

## 2022-03-07 DIAGNOSIS — I639 Cerebral infarction, unspecified: Secondary | ICD-10-CM | POA: Diagnosis not present

## 2022-03-07 DIAGNOSIS — R293 Abnormal posture: Secondary | ICD-10-CM | POA: Diagnosis not present

## 2022-03-07 DIAGNOSIS — R1310 Dysphagia, unspecified: Secondary | ICD-10-CM | POA: Diagnosis not present

## 2022-03-07 DIAGNOSIS — M1990 Unspecified osteoarthritis, unspecified site: Secondary | ICD-10-CM | POA: Diagnosis not present

## 2022-03-07 DIAGNOSIS — I69354 Hemiplegia and hemiparesis following cerebral infarction affecting left non-dominant side: Secondary | ICD-10-CM | POA: Diagnosis not present

## 2022-03-09 DIAGNOSIS — M1990 Unspecified osteoarthritis, unspecified site: Secondary | ICD-10-CM | POA: Diagnosis not present

## 2022-03-09 DIAGNOSIS — E1122 Type 2 diabetes mellitus with diabetic chronic kidney disease: Secondary | ICD-10-CM | POA: Diagnosis not present

## 2022-03-09 DIAGNOSIS — I639 Cerebral infarction, unspecified: Secondary | ICD-10-CM | POA: Diagnosis not present

## 2022-03-09 DIAGNOSIS — I7121 Aneurysm of the ascending aorta, without rupture: Secondary | ICD-10-CM | POA: Diagnosis not present

## 2022-03-09 DIAGNOSIS — R293 Abnormal posture: Secondary | ICD-10-CM | POA: Diagnosis not present

## 2022-03-09 DIAGNOSIS — M6281 Muscle weakness (generalized): Secondary | ICD-10-CM | POA: Diagnosis not present

## 2022-03-09 DIAGNOSIS — M4699 Unspecified inflammatory spondylopathy, multiple sites in spine: Secondary | ICD-10-CM | POA: Diagnosis not present

## 2022-03-09 DIAGNOSIS — R2689 Other abnormalities of gait and mobility: Secondary | ICD-10-CM | POA: Diagnosis not present

## 2022-03-09 DIAGNOSIS — M4692 Unspecified inflammatory spondylopathy, cervical region: Secondary | ICD-10-CM | POA: Diagnosis not present

## 2022-03-09 DIAGNOSIS — R278 Other lack of coordination: Secondary | ICD-10-CM | POA: Diagnosis not present

## 2022-03-09 DIAGNOSIS — R1310 Dysphagia, unspecified: Secondary | ICD-10-CM | POA: Diagnosis not present

## 2022-03-09 DIAGNOSIS — Z741 Need for assistance with personal care: Secondary | ICD-10-CM | POA: Diagnosis not present

## 2022-03-09 DIAGNOSIS — R52 Pain, unspecified: Secondary | ICD-10-CM | POA: Diagnosis not present

## 2022-03-09 DIAGNOSIS — I69354 Hemiplegia and hemiparesis following cerebral infarction affecting left non-dominant side: Secondary | ICD-10-CM | POA: Diagnosis not present

## 2022-03-10 DIAGNOSIS — M4699 Unspecified inflammatory spondylopathy, multiple sites in spine: Secondary | ICD-10-CM | POA: Diagnosis not present

## 2022-03-10 DIAGNOSIS — Z741 Need for assistance with personal care: Secondary | ICD-10-CM | POA: Diagnosis not present

## 2022-03-10 DIAGNOSIS — R52 Pain, unspecified: Secondary | ICD-10-CM | POA: Diagnosis not present

## 2022-03-10 DIAGNOSIS — R2689 Other abnormalities of gait and mobility: Secondary | ICD-10-CM | POA: Diagnosis not present

## 2022-03-10 DIAGNOSIS — M4692 Unspecified inflammatory spondylopathy, cervical region: Secondary | ICD-10-CM | POA: Diagnosis not present

## 2022-03-10 DIAGNOSIS — M6281 Muscle weakness (generalized): Secondary | ICD-10-CM | POA: Diagnosis not present

## 2022-03-10 DIAGNOSIS — M1990 Unspecified osteoarthritis, unspecified site: Secondary | ICD-10-CM | POA: Diagnosis not present

## 2022-03-10 DIAGNOSIS — R1310 Dysphagia, unspecified: Secondary | ICD-10-CM | POA: Diagnosis not present

## 2022-03-10 DIAGNOSIS — I7121 Aneurysm of the ascending aorta, without rupture: Secondary | ICD-10-CM | POA: Diagnosis not present

## 2022-03-10 DIAGNOSIS — I639 Cerebral infarction, unspecified: Secondary | ICD-10-CM | POA: Diagnosis not present

## 2022-03-10 DIAGNOSIS — I69354 Hemiplegia and hemiparesis following cerebral infarction affecting left non-dominant side: Secondary | ICD-10-CM | POA: Diagnosis not present

## 2022-03-10 DIAGNOSIS — E1122 Type 2 diabetes mellitus with diabetic chronic kidney disease: Secondary | ICD-10-CM | POA: Diagnosis not present

## 2022-03-10 DIAGNOSIS — R278 Other lack of coordination: Secondary | ICD-10-CM | POA: Diagnosis not present

## 2022-03-10 DIAGNOSIS — R293 Abnormal posture: Secondary | ICD-10-CM | POA: Diagnosis not present

## 2022-03-11 DIAGNOSIS — M6281 Muscle weakness (generalized): Secondary | ICD-10-CM | POA: Diagnosis not present

## 2022-03-11 DIAGNOSIS — R2689 Other abnormalities of gait and mobility: Secondary | ICD-10-CM | POA: Diagnosis not present

## 2022-03-11 DIAGNOSIS — I69354 Hemiplegia and hemiparesis following cerebral infarction affecting left non-dominant side: Secondary | ICD-10-CM | POA: Diagnosis not present

## 2022-03-11 DIAGNOSIS — R1310 Dysphagia, unspecified: Secondary | ICD-10-CM | POA: Diagnosis not present

## 2022-03-11 DIAGNOSIS — R278 Other lack of coordination: Secondary | ICD-10-CM | POA: Diagnosis not present

## 2022-03-11 DIAGNOSIS — Z741 Need for assistance with personal care: Secondary | ICD-10-CM | POA: Diagnosis not present

## 2022-03-11 DIAGNOSIS — M1990 Unspecified osteoarthritis, unspecified site: Secondary | ICD-10-CM | POA: Diagnosis not present

## 2022-03-11 DIAGNOSIS — R52 Pain, unspecified: Secondary | ICD-10-CM | POA: Diagnosis not present

## 2022-03-11 DIAGNOSIS — I7121 Aneurysm of the ascending aorta, without rupture: Secondary | ICD-10-CM | POA: Diagnosis not present

## 2022-03-11 DIAGNOSIS — M4699 Unspecified inflammatory spondylopathy, multiple sites in spine: Secondary | ICD-10-CM | POA: Diagnosis not present

## 2022-03-11 DIAGNOSIS — E1122 Type 2 diabetes mellitus with diabetic chronic kidney disease: Secondary | ICD-10-CM | POA: Diagnosis not present

## 2022-03-11 DIAGNOSIS — I639 Cerebral infarction, unspecified: Secondary | ICD-10-CM | POA: Diagnosis not present

## 2022-03-11 DIAGNOSIS — M4692 Unspecified inflammatory spondylopathy, cervical region: Secondary | ICD-10-CM | POA: Diagnosis not present

## 2022-03-11 DIAGNOSIS — R293 Abnormal posture: Secondary | ICD-10-CM | POA: Diagnosis not present

## 2022-03-12 ENCOUNTER — Ambulatory Visit: Payer: Medicare Other | Admitting: General Practice

## 2022-03-12 DIAGNOSIS — R278 Other lack of coordination: Secondary | ICD-10-CM | POA: Diagnosis not present

## 2022-03-12 DIAGNOSIS — E1122 Type 2 diabetes mellitus with diabetic chronic kidney disease: Secondary | ICD-10-CM | POA: Diagnosis not present

## 2022-03-12 DIAGNOSIS — R52 Pain, unspecified: Secondary | ICD-10-CM | POA: Diagnosis not present

## 2022-03-12 DIAGNOSIS — R2689 Other abnormalities of gait and mobility: Secondary | ICD-10-CM | POA: Diagnosis not present

## 2022-03-12 DIAGNOSIS — D649 Anemia, unspecified: Secondary | ICD-10-CM | POA: Diagnosis not present

## 2022-03-12 DIAGNOSIS — M4699 Unspecified inflammatory spondylopathy, multiple sites in spine: Secondary | ICD-10-CM | POA: Diagnosis not present

## 2022-03-12 DIAGNOSIS — I639 Cerebral infarction, unspecified: Secondary | ICD-10-CM | POA: Diagnosis not present

## 2022-03-12 DIAGNOSIS — M6281 Muscle weakness (generalized): Secondary | ICD-10-CM | POA: Diagnosis not present

## 2022-03-12 DIAGNOSIS — I69354 Hemiplegia and hemiparesis following cerebral infarction affecting left non-dominant side: Secondary | ICD-10-CM | POA: Diagnosis not present

## 2022-03-12 DIAGNOSIS — U071 COVID-19: Secondary | ICD-10-CM | POA: Diagnosis not present

## 2022-03-12 DIAGNOSIS — Z741 Need for assistance with personal care: Secondary | ICD-10-CM | POA: Diagnosis not present

## 2022-03-12 DIAGNOSIS — R1310 Dysphagia, unspecified: Secondary | ICD-10-CM | POA: Diagnosis not present

## 2022-03-12 DIAGNOSIS — N184 Chronic kidney disease, stage 4 (severe): Secondary | ICD-10-CM | POA: Diagnosis not present

## 2022-03-12 DIAGNOSIS — R293 Abnormal posture: Secondary | ICD-10-CM | POA: Diagnosis not present

## 2022-03-12 DIAGNOSIS — I7121 Aneurysm of the ascending aorta, without rupture: Secondary | ICD-10-CM | POA: Diagnosis not present

## 2022-03-12 DIAGNOSIS — I129 Hypertensive chronic kidney disease with stage 1 through stage 4 chronic kidney disease, or unspecified chronic kidney disease: Secondary | ICD-10-CM | POA: Diagnosis not present

## 2022-03-12 DIAGNOSIS — M1990 Unspecified osteoarthritis, unspecified site: Secondary | ICD-10-CM | POA: Diagnosis not present

## 2022-03-12 DIAGNOSIS — M4692 Unspecified inflammatory spondylopathy, cervical region: Secondary | ICD-10-CM | POA: Diagnosis not present

## 2022-03-13 ENCOUNTER — Ambulatory Visit: Payer: Medicare (Managed Care)

## 2022-03-13 ENCOUNTER — Ambulatory Visit: Payer: Medicare (Managed Care) | Admitting: Internal Medicine

## 2022-03-13 DIAGNOSIS — I69354 Hemiplegia and hemiparesis following cerebral infarction affecting left non-dominant side: Secondary | ICD-10-CM | POA: Diagnosis not present

## 2022-03-13 DIAGNOSIS — M4692 Unspecified inflammatory spondylopathy, cervical region: Secondary | ICD-10-CM | POA: Diagnosis not present

## 2022-03-13 DIAGNOSIS — M4699 Unspecified inflammatory spondylopathy, multiple sites in spine: Secondary | ICD-10-CM | POA: Diagnosis not present

## 2022-03-13 DIAGNOSIS — R2689 Other abnormalities of gait and mobility: Secondary | ICD-10-CM | POA: Diagnosis not present

## 2022-03-13 DIAGNOSIS — I639 Cerebral infarction, unspecified: Secondary | ICD-10-CM | POA: Diagnosis not present

## 2022-03-13 DIAGNOSIS — Z741 Need for assistance with personal care: Secondary | ICD-10-CM | POA: Diagnosis not present

## 2022-03-13 DIAGNOSIS — E1122 Type 2 diabetes mellitus with diabetic chronic kidney disease: Secondary | ICD-10-CM | POA: Diagnosis not present

## 2022-03-13 DIAGNOSIS — N184 Chronic kidney disease, stage 4 (severe): Secondary | ICD-10-CM | POA: Diagnosis not present

## 2022-03-13 DIAGNOSIS — M1990 Unspecified osteoarthritis, unspecified site: Secondary | ICD-10-CM | POA: Diagnosis not present

## 2022-03-13 DIAGNOSIS — R1310 Dysphagia, unspecified: Secondary | ICD-10-CM | POA: Diagnosis not present

## 2022-03-13 DIAGNOSIS — M6281 Muscle weakness (generalized): Secondary | ICD-10-CM | POA: Diagnosis not present

## 2022-03-13 DIAGNOSIS — I7121 Aneurysm of the ascending aorta, without rupture: Secondary | ICD-10-CM | POA: Diagnosis not present

## 2022-03-13 DIAGNOSIS — R278 Other lack of coordination: Secondary | ICD-10-CM | POA: Diagnosis not present

## 2022-03-13 DIAGNOSIS — R293 Abnormal posture: Secondary | ICD-10-CM | POA: Diagnosis not present

## 2022-03-13 DIAGNOSIS — R52 Pain, unspecified: Secondary | ICD-10-CM | POA: Diagnosis not present

## 2022-03-14 DIAGNOSIS — M1990 Unspecified osteoarthritis, unspecified site: Secondary | ICD-10-CM | POA: Diagnosis not present

## 2022-03-14 DIAGNOSIS — I129 Hypertensive chronic kidney disease with stage 1 through stage 4 chronic kidney disease, or unspecified chronic kidney disease: Secondary | ICD-10-CM | POA: Diagnosis not present

## 2022-03-14 DIAGNOSIS — M4692 Unspecified inflammatory spondylopathy, cervical region: Secondary | ICD-10-CM | POA: Diagnosis not present

## 2022-03-14 DIAGNOSIS — R278 Other lack of coordination: Secondary | ICD-10-CM | POA: Diagnosis not present

## 2022-03-14 DIAGNOSIS — M6281 Muscle weakness (generalized): Secondary | ICD-10-CM | POA: Diagnosis not present

## 2022-03-14 DIAGNOSIS — I69354 Hemiplegia and hemiparesis following cerebral infarction affecting left non-dominant side: Secondary | ICD-10-CM | POA: Diagnosis not present

## 2022-03-14 DIAGNOSIS — M4699 Unspecified inflammatory spondylopathy, multiple sites in spine: Secondary | ICD-10-CM | POA: Diagnosis not present

## 2022-03-14 DIAGNOSIS — R1310 Dysphagia, unspecified: Secondary | ICD-10-CM | POA: Diagnosis not present

## 2022-03-14 DIAGNOSIS — I639 Cerebral infarction, unspecified: Secondary | ICD-10-CM | POA: Diagnosis not present

## 2022-03-14 DIAGNOSIS — E1122 Type 2 diabetes mellitus with diabetic chronic kidney disease: Secondary | ICD-10-CM | POA: Diagnosis not present

## 2022-03-14 DIAGNOSIS — Z741 Need for assistance with personal care: Secondary | ICD-10-CM | POA: Diagnosis not present

## 2022-03-14 DIAGNOSIS — I7121 Aneurysm of the ascending aorta, without rupture: Secondary | ICD-10-CM | POA: Diagnosis not present

## 2022-03-14 DIAGNOSIS — R2689 Other abnormalities of gait and mobility: Secondary | ICD-10-CM | POA: Diagnosis not present

## 2022-03-14 DIAGNOSIS — R293 Abnormal posture: Secondary | ICD-10-CM | POA: Diagnosis not present

## 2022-03-14 DIAGNOSIS — R52 Pain, unspecified: Secondary | ICD-10-CM | POA: Diagnosis not present

## 2022-03-17 DIAGNOSIS — I639 Cerebral infarction, unspecified: Secondary | ICD-10-CM | POA: Diagnosis not present

## 2022-03-17 DIAGNOSIS — R293 Abnormal posture: Secondary | ICD-10-CM | POA: Diagnosis not present

## 2022-03-17 DIAGNOSIS — R52 Pain, unspecified: Secondary | ICD-10-CM | POA: Diagnosis not present

## 2022-03-17 DIAGNOSIS — I69354 Hemiplegia and hemiparesis following cerebral infarction affecting left non-dominant side: Secondary | ICD-10-CM | POA: Diagnosis not present

## 2022-03-17 DIAGNOSIS — I7121 Aneurysm of the ascending aorta, without rupture: Secondary | ICD-10-CM | POA: Diagnosis not present

## 2022-03-17 DIAGNOSIS — M1990 Unspecified osteoarthritis, unspecified site: Secondary | ICD-10-CM | POA: Diagnosis not present

## 2022-03-17 DIAGNOSIS — R2689 Other abnormalities of gait and mobility: Secondary | ICD-10-CM | POA: Diagnosis not present

## 2022-03-17 DIAGNOSIS — E1122 Type 2 diabetes mellitus with diabetic chronic kidney disease: Secondary | ICD-10-CM | POA: Diagnosis not present

## 2022-03-17 DIAGNOSIS — R1310 Dysphagia, unspecified: Secondary | ICD-10-CM | POA: Diagnosis not present

## 2022-03-17 DIAGNOSIS — Z741 Need for assistance with personal care: Secondary | ICD-10-CM | POA: Diagnosis not present

## 2022-03-17 DIAGNOSIS — M4699 Unspecified inflammatory spondylopathy, multiple sites in spine: Secondary | ICD-10-CM | POA: Diagnosis not present

## 2022-03-17 DIAGNOSIS — M4692 Unspecified inflammatory spondylopathy, cervical region: Secondary | ICD-10-CM | POA: Diagnosis not present

## 2022-03-17 DIAGNOSIS — M6281 Muscle weakness (generalized): Secondary | ICD-10-CM | POA: Diagnosis not present

## 2022-03-17 DIAGNOSIS — R278 Other lack of coordination: Secondary | ICD-10-CM | POA: Diagnosis not present

## 2022-03-17 NOTE — Progress Notes (Deleted)
Cardiology Clinic Note   Patient Name: Anna Hoffman [facility] Date of Encounter: 03/17/2022  Primary Care Provider:  Pcp, No Primary Cardiologist:  Donato Heinz, MD  Patient Profile    Anna Hoffman 87 year old female presents the clinic today for follow-up evaluation of her hypertrophic cardiomyopathy, syncope, and essential hypertension.  Past Medical History    Past Medical History:  Diagnosis Date   Arthritis    Diabetes mellitus without complication ([place])    Hypertension    Hypokalemia    Pneumonia    Shingles    Stroke [facility])    Past Surgical History:  Procedure Laterality Date   ABDOMINAL HYSTERECTOMY     BREAST EXCISIONAL BIOPSY Left    ESOPHAGOGASTRODUODENOSCOPY N/A 09/01/2014   Procedure: ESOPHAGOGASTRODUODENOSCOPY (EGD);  Surgeon: Lear Ng, MD;  Location: [facility];  Service: Endoscopy;  Laterality: N/A;   ESOPHAGOGASTRODUODENOSCOPY (EGD) WITH PROPOFOL N/A 10/12/2020   Procedure: ESOPHAGOGASTRODUODENOSCOPY (EGD) WITH PROPOFOL;  Surgeon: Mauri Pole, MD;  Location: [facility]COPY;  Service: Endoscopy;  Laterality: N/A;   HIATAL HERNIA REPAIR N/A 09/04/2014   Procedure: LAPAROSCOPIC REPAIR OF HIATAL HERNIA;  Surgeon: Excell Seltzer, MD;  Location: [facility] ORS;  Service: General;  Laterality: N/A;  With MESH   IR GENERIC HISTORICAL  04/10/2016   IR US GUIDE VASC ACCESS RIGHT 04/10/2016 Corrie Mckusick, DO [facility]   IR GENERIC HISTORICAL  04/10/2016   IR ANGIOGRAM SELECTIVE EACH ADDITIONAL VESSEL 04/10/2016 Corrie Mckusick, DO [facility]   IR GENERIC HISTORICAL  04/10/2016   IR ANGIOGRAM SELECTIVE EACH ADDITIONAL VESSEL 04/10/2016 Corrie Mckusick, DO [facility]   IR GENERIC HISTORICAL  04/10/2016   IR EMBO TUMOR ORGAN ISCHEMIA INFARCT INC GUIDE ROADMAPPING 04/10/2016 Corrie Mckusick, DO [facility]   IR GENERIC HISTORICAL  04/10/2016   IR ANGIOGRAM SELECTIVE EACH ADDITIONAL VESSEL 04/10/2016 Corrie Mckusick, DO [facility]   IR GENERIC HISTORICAL  04/10/2016   IR  ANGIOGRAM SELECTIVE EACH ADDITIONAL VESSEL 04/10/2016 Corrie Mckusick, DO [facility]   IR GENERIC HISTORICAL  04/10/2016   IR RENAL SELECTIVE  UNI INC S&I MOD SED 04/10/2016 Corrie Mckusick, DO [facility]   IR GENERIC HISTORICAL  03/06/2016   IR RADIOLOGIST EVAL & MGMT 03/06/2016 Corrie Mckusick, DO [facility]   IR GENERIC HISTORICAL  03/26/2016   IR RADIOLOGIST EVAL & MGMT 03/26/2016 Corrie Mckusick, DO [facility]   IR GENERIC HISTORICAL  04/29/2016   IR RADIOLOGIST EVAL & MGMT 04/29/2016 [facility]   IR RADIOLOGIST EVAL & MGMT  11/12/2016   IR RADIOLOGIST EVAL & MGMT  12/16/2017   KNEE SURGERY     TONSILLECTOMY      Allergies  Allergies  Allergen Reactions   Amoxicillin Diarrhea    Has patient had a PCN reaction causing immediate rash, facial/tongue/throat swelling, SOB or lightheadedness with hypotension: no Has patient had a PCN reaction causing severe rash involving mucus membranes or skin necrosis: no Has patient had a PCN reaction that required hospitalization: no pharmacist consult Has patient had a PCN reaction occurring within the last 10 years: yes If all of the above answers are "NO", then may proceed with Cephalosporin use.    Cefepime Other (See Comments)    Possible CNS toxicity 05/2021   Ampicillin Rash    - Tolerates Rocephin and Ancef - Remote occurrence; no symptoms of anaphylaxis or severe cutaneous reaction, and no additional medical attention required     Sulfa Antibiotics Rash    History of Present  Illness    [name] has a PMH of essential hypertension, CVA, aortic atherosclerosis, hypertrophic obstructive cardiomyopathy, ascending aortic dilation, type 2 diabetes, CKD stage III, hypokalemia, syncope, and generalized weakness.  Echocardiogram 8/22 showed severe basal septal hypertrophy, hyperdynamic LV systolic function, normal RV function, mild AI, and mild dilation of the ascending aorta measuring 41 mm.  Her cardiac MRI 8/22 showed asymmetric LV  hypertrophy measuring 19 mm and basal septum 11 mm.  Her LGE accounted for 2% of her total myocardial mass.  She wore a cardiac event monitor for 14 days which showed 1 run of NSVT that lasted for 4 beats, and Wenckebach.  She was seen by Dr. Lovena Le on 10/22.  It was suspected that her syncope was due to heart block with 2-1 AV block during admission noted 3/22.  It was felt that she would need a pacemaker but the patient declined at the time.  It was recommended that she consider a loop recorder.   She was seen by Dr. Gardiner Rhyme on 05/16/2021.  During the visit she reported that she had a syncopal episode.  This week.  She had a sudden loss of consciousness.  She saw Dr. Lovena Le recommended pacemaker and she was considering the offer.  She denied chest pain or shortness of breath, lower extremity swelling, and palpitations.  She was admitted to the hospital 07/13/2021 and discharged on 07/15/2021.  She was diagnosed with generalized weakness.  She had previously been admitted to the hospital with acute CVA and altered mental status complicated by AKI.  She had been discharged to rehab.  She was taken back home due to generalized weakness and poor oral intake.  There was suspicion for UTI and dehydration.  She was started on Rocephin.  Her culture showed ESBL.  She received antibiotics and was seen by physical therapy who recommended home health.  She reported that she felt the best she had and months and was ready to be discharged home.  She presented the clinic 08/16/21 for follow-up evaluation stated she felt well.  She presented with her son.  We reviewed her most recent hospitalization and her echocardiogram.  They expressed understanding.  She was ready to proceed with consideration for pacemaker implantation.  She was initially reluctant and did not wish to proceed with implantation but has considered loop recorder implantation versus pacemaker implantation.  Wished to discuss PPM with EP further.  She was   doing physical therapy at home for her right shoulder.  She had limited mobility after her most recent fall.  I  continued her current medication regimen, had her increase her physical activity as tolerated, and planned follow-up for 4 months.  She presented to [facility]/14/2021 with slurred speech and lower extremity weakness.  She was found to have acute CVA.  At discharge she was placed on aspirin and Plavix for 21 days and then aspirin 325 mg daily thereafter.  Her echocardiogram showed an EF of 60 to 65% and LDL of 45 and a cardiac event monitor was recommended to assess for atrial fibrillation.  She followed up with Dr. Gardiner Rhyme 12/05/2021.  She reported that she is doing okay.  She had been doing physical therapy.  She denied chest pain, dyspnea, and palpitations.  She did note some lightheadedness but denied syncope.  She reported lower extremity swelling.  Follow-up was planned for 3 months.  This clinic today for follow-up evaluation states***  Today showing denies chest pain, shortness of breath, lower  extremity edema, fatigue, palpitations, melena, hematuria, hemoptysis, diaphoresis, weakness, presyncope, syncope, orthopnea, and PND.  Hypertrophic cardiomyopathy-echocardiogram showed hyperdynamic systolic function and left ventricular hypertrophy along with G1 DD. cardiac MRI 8/22 showed asymmetric LV hypertrophy measuring 19 mm and basal septum 11 mm.  Her LGE accounted for 2% of her total myocardial mass.  EP previously recommended PPM however patient wished to defer.  Family previously encouraged to be screened.  Patient has 2 sons. Continue metoprolol Heart healthy low-sodium diet Increase physical activity as tolerated  History of CVA-underwent hospital admission in 3/23 at [facility] due to CVA.  She was discharged on aspirin and Plavix for 21 days and then placed on 325 mg of aspirin daily.  A cardiac event monitor was recommended to assess for A-fib.  No atrial  fibrillation was identified on monitor to explain CVA. Refer to EP for consideration of loop recorder.  Essential hypertension-BP today ***142/72.  Well-controlled at home. Continue amlodipine,  metoprolol Heart healthy low-sodium diet-salty 6 given Increase physical activity as tolerated  Sinus tachycardia-heart rate today 99*** bpm.  Denies recent episodes of accelerated heartbeat.  Beta-blocker added during 10/22 hospital admission after being transitioned off of IV Lopressor. Continue metoprolol Heart healthy low-sodium diet Increase physical activity as tolerated Avoid caffeine, chocolate, dehydration, EtOH etc.  First-degree AV block-No episodes of presyncope or syncope.  Seen and evaluated by EP who offered PPM.  Patient did not wish to undergo implantation previously but wishes to be seen again by  Continue metoprolol Continues to follow-up with EP   CKD stage III-creatinine 1.57 on 08/28/21 . Follows with PCP  Disposition: Follow-up with Dr. Gardiner Rhyme in 4 months.  Home Medications    Prior to Admission medications   Medication Sig Start Date End Date Taking? Authorizing Provider  acetaminophen (TYLENOL) 325 MG tablet Take 2 tablets (650 mg total) by mouth every 6 (six) hours as needed for mild pain (or Fever >/= 101). Patient not taking: Reported on 07/15/2021 02/08/16   Irwin Brakeman L, MD  amLODipine (NORVASC) 10 MG tablet Take 1 tablet (10 mg total) by mouth daily. 06/26/21     aspirin 81 MG EC tablet Take 1 tablet (81 mg total) by mouth daily. Swallow whole. 07/15/21   Amin, Jeanella Flattery, MD  Vitamin D3 (VITAMIN D) 25 MCG tablet Take 1 tablet (1,000 Units total) by mouth daily. 07/15/21 08/14/21  Damita Lack, MD  diclofenac Sodium (VOLTAREN) 1 % GEL Apply 2 g topically every 12 (twelve) hours to left ankle and right shoulder pain 06/26/21     fexofenadine (ALLEGRA ALLERGY) 180 MG tablet Take 1 tablet (180 mg total) by mouth daily. 07/18/21   Glendale Chard, MD   glucose blood test strip Use as directed to check blood sugars 1 time per day. 03/01/21   Glendale Chard, MD  glucose monitoring kit (FREESTYLE) monitoring kit Use as directed to check blood sugars 1 time per day dx: e11.22 03/01/21   Glendale Chard, MD  hydrALAZINE (APRESOLINE) 100 MG tablet Take 1 tablet (100 mg total) by mouth every 8 (eight) hours. 06/26/21     insulin aspart (NOVOLOG) 100 UNIT/ML FlexPen Inject into the skin with meals per sliding scale. 151-200= 2 units, 201-250= 4 units, 251-300= 6 units, 301-350=8 units, 351-400=10 units, >401=10 units and notify MD Patient taking differently: Inject 2-10 Units into the skin in the morning and at bedtime. 06/26/21     insulin aspart (NOVOLOG) 100 UNIT/ML injection Inject 0-9 Units into the skin 3 (three)  times daily with meals. Patient not taking: Reported on 07/10/2021 06/06/21   Donne Hazel, MD  Lancets [facility] DELICA PLUS [id number]) MISC Use to check blood sugar once daily 02/07/21     metoprolol tartrate (LOPRESSOR) 25 MG tablet Take 0.5 tablets (12.5 mg total) by mouth 2 (two) times daily. 07/18/21   Glendale Chard, MD  pantoprazole (PROTONIX) 40 MG tablet Take 1 tablet (40 mg total) by mouth 2 (two) times daily. 06/26/21     prochlorperazine (COMPAZINE) 5 MG tablet Take 1 tablet (5 mg total) by mouth every 6 (six) hours as needed for nausea or vomiting. Patient not taking: Reported on 07/10/2021 05/22/21   Minette Brine, FNP  rosuvastatin (CRESTOR) 20 MG tablet Take 1 tablet (20 mg total) by mouth daily. 07/18/21   Glendale Chard, MD  sucralfate (CARAFATE) 1 GM/10ML suspension Take 10 mLs (1 g total) by mouth 4 (four) times daily -  with meals and at bedtime for 28 days 07/10/21   Glendale Chard, MD    Family History    Family History  Problem Relation Age of Onset   Alzheimer's disease Mother    Prostate cancer Father    Brain cancer Brother    Brain cancer Brother    Pancreatic cancer Sister    Allergic rhinitis Neg Hx     Asthma Neg Hx    Eczema Neg Hx    Urticaria Neg Hx    She indicated that her mother is deceased. She indicated that her father is deceased. She indicated that her sister is deceased. She indicated that both of her brothers are deceased. She indicated that the status of her neg hx is unknown.  Social History    Social History   Socioeconomic History   Marital status: Widowed    Spouse name: Not on file   Number of children: 2   Years of education: Not on file   Highest education level: Not on file  Occupational History   Occupation: retired  Tobacco Use   Smoking status: Never   Smokeless tobacco: Never  Vaping Use   Vaping Use: Never used  Substance and Sexual Activity   Alcohol use: No   Drug use: No   Sexual activity: Not Currently    Birth control/protection: Surgical  Other Topics Concern   Not on file  Social History Narrative   Son [name] lives with her   Social Determinants of Health   Financial Resource Strain: Low Risk  (02/14/2021)   Overall Financial Resource Strain (CARDIA)    Difficulty of Paying Living Expenses: Not hard at all  Food Insecurity: No Food Insecurity (02/14/2021)   Hunger Vital Sign    Worried About Running Out of Food in the Last Year: Never true    [place] in the Last Year: Never true  Transportation Needs: No Transportation Needs (02/14/2021)   PRAPARE - Hydrologist (Medical): No    Lack of Transportation (Non-Medical): No  Physical Activity: Sufficiently Active (02/14/2021)   Exercise Vital Sign    Days of Exercise per Week: 6 days    Minutes of Exercise per Session: 30 min  Stress: No Stress Concern Present (02/14/2021)   [place]    Feeling of Stress : Not at all  Social Connections: Not on file  Intimate Partner Violence: Not At Risk (04/06/2019)   Humiliation, Afraid, Rape, and Kick questionnaire    Fear of Current  or Ex-Partner:  No    Emotionally Abused: No    Physically Abused: No    Sexually Abused: No     Review of Systems    General:  No chills, fever, night sweats or weight changes.  Cardiovascular:  No chest pain, dyspnea on exertion, edema, orthopnea, palpitations, paroxysmal nocturnal dyspnea. Dermatological: No rash, lesions/masses Respiratory: No cough, dyspnea Urologic: No hematuria, dysuria Abdominal:   No nausea, vomiting, diarrhea, bright red blood per rectum, melena, or hematemesis Neurologic:  No visual changes, wkns, changes in mental status. All other systems reviewed and are otherwise negative except as noted above.  Physical Exam    VS:  LMP  (LMP Unknown)  , BMI There is no height or weight on file to calculate BMI. GEN: Well nourished, well developed, in no acute distress. HEENT: normal. Neck: Supple, no JVD, carotid bruits, or masses. Cardiac: RRR, no murmurs, rubs, or gallops. No clubbing, cyanosis, edema.  Radials/DP/PT 2+ and equal bilaterally.  Respiratory:  Respirations regular and unlabored, clear to auscultation bilaterally. GI: Soft, nontender, nondistended, BS + x 4. MS: no deformity or atrophy. Skin: warm and dry, no rash. Neuro:  Strength and sensation are intact. Psych: Normal affect.  Accessory Clinical Findings    Recent Labs: 07/13/2021: TSH 1.098 08/25/2021: ALT 12 08/27/2021: Magnesium 2.2 08/28/2021: BUN 22; Creatinine, Ser 1.57; Hemoglobin 9.4; Platelets 310; Potassium 4.0; Sodium 136   Recent Lipid Panel    Component Value Date/Time   CHOL 139 05/27/2021 1645   CHOL 150 10/18/2020 1549   TRIG 89 05/27/2021 1645   HDL 74 05/27/2021 1645   HDL 74 10/18/2020 1549   CHOLHDL 1.9 05/27/2021 1645   VLDL 18 05/27/2021 1645   LDLCALC 47 05/27/2021 1645   LDLCALC 51 10/18/2020 1549    ECG personally reviewed by me today-none today.  Echocardiogram 04/01/2021 1. Left ventricular ejection fraction, by estimation, is 65 to 70%. The  left ventricle has  hyperdynamic function. The left ventricle has no  regional wall motion abnormalities. There is severe focal basal septal  left ventricular hypertrophy, no LV  outflow tract gradient. Left ventricular diastolic parameters are  consistent with Grade I diastolic dysfunction (impaired relaxation). The  average left ventricular global longitudinal strain is -20.7 %. The global  longitudinal strain is normal.   2. Right ventricular systolic function is normal. The right ventricular  size is normal. There is normal pulmonary artery systolic pressure. The  estimated right ventricular systolic pressure is 23.7 mmHg.   3. Left atrial size was mildly dilated.   4. The mitral valve is normal in structure. Trivial mitral valve  regurgitation. No evidence of mitral stenosis.   5. The aortic valve is tricuspid. Aortic valve regurgitation is mild.  Mild aortic valve sclerosis is present, with no evidence of aortic valve  stenosis.   6. Aortic dilatation noted. There is mild dilatation of the ascending  aorta, measuring 41 mm.   7. The inferior vena cava is normal in size with greater than 50%  respiratory variability, suggesting right atrial pressure of 3 mmHg Assessment & Plan   1.    Jossie Ng. Kehinde Totzke NP-C    03/17/2022, 1:20 PM [facility] [place] [address] Office [phone] Fax [phone]  Notice: This dictation was prepared with Dragon dictation along with smaller phrase technology. Any transcriptional errors that result from this process are unintentional and may not be corrected upon review.  I spent 15*** minutes examining this patient,  reviewing medications, and using patient centered shared decision making involving her cardiac care.  Prior to her visit I spent greater than 20 minutes reviewing her past medical history,  medications, and prior cardiac tests.

## 2022-03-18 DIAGNOSIS — I69354 Hemiplegia and hemiparesis following cerebral infarction affecting left non-dominant side: Secondary | ICD-10-CM | POA: Diagnosis not present

## 2022-03-18 DIAGNOSIS — M1990 Unspecified osteoarthritis, unspecified site: Secondary | ICD-10-CM | POA: Diagnosis not present

## 2022-03-18 DIAGNOSIS — M6281 Muscle weakness (generalized): Secondary | ICD-10-CM | POA: Diagnosis not present

## 2022-03-18 DIAGNOSIS — M4699 Unspecified inflammatory spondylopathy, multiple sites in spine: Secondary | ICD-10-CM | POA: Diagnosis not present

## 2022-03-18 DIAGNOSIS — E1122 Type 2 diabetes mellitus with diabetic chronic kidney disease: Secondary | ICD-10-CM | POA: Diagnosis not present

## 2022-03-18 DIAGNOSIS — R1310 Dysphagia, unspecified: Secondary | ICD-10-CM | POA: Diagnosis not present

## 2022-03-18 DIAGNOSIS — R2689 Other abnormalities of gait and mobility: Secondary | ICD-10-CM | POA: Diagnosis not present

## 2022-03-18 DIAGNOSIS — I639 Cerebral infarction, unspecified: Secondary | ICD-10-CM | POA: Diagnosis not present

## 2022-03-18 DIAGNOSIS — R293 Abnormal posture: Secondary | ICD-10-CM | POA: Diagnosis not present

## 2022-03-18 DIAGNOSIS — I7121 Aneurysm of the ascending aorta, without rupture: Secondary | ICD-10-CM | POA: Diagnosis not present

## 2022-03-18 DIAGNOSIS — R52 Pain, unspecified: Secondary | ICD-10-CM | POA: Diagnosis not present

## 2022-03-18 DIAGNOSIS — R278 Other lack of coordination: Secondary | ICD-10-CM | POA: Diagnosis not present

## 2022-03-18 DIAGNOSIS — Z741 Need for assistance with personal care: Secondary | ICD-10-CM | POA: Diagnosis not present

## 2022-03-18 DIAGNOSIS — M4692 Unspecified inflammatory spondylopathy, cervical region: Secondary | ICD-10-CM | POA: Diagnosis not present

## 2022-03-19 ENCOUNTER — Ambulatory Visit: Payer: Medicare Other | Admitting: General Practice

## 2022-03-19 DIAGNOSIS — M6281 Muscle weakness (generalized): Secondary | ICD-10-CM | POA: Diagnosis not present

## 2022-03-19 DIAGNOSIS — M4699 Unspecified inflammatory spondylopathy, multiple sites in spine: Secondary | ICD-10-CM | POA: Diagnosis not present

## 2022-03-19 DIAGNOSIS — R2689 Other abnormalities of gait and mobility: Secondary | ICD-10-CM | POA: Diagnosis not present

## 2022-03-19 DIAGNOSIS — R278 Other lack of coordination: Secondary | ICD-10-CM | POA: Diagnosis not present

## 2022-03-19 DIAGNOSIS — R52 Pain, unspecified: Secondary | ICD-10-CM | POA: Diagnosis not present

## 2022-03-19 DIAGNOSIS — I639 Cerebral infarction, unspecified: Secondary | ICD-10-CM | POA: Diagnosis not present

## 2022-03-19 DIAGNOSIS — I69354 Hemiplegia and hemiparesis following cerebral infarction affecting left non-dominant side: Secondary | ICD-10-CM | POA: Diagnosis not present

## 2022-03-19 DIAGNOSIS — R1310 Dysphagia, unspecified: Secondary | ICD-10-CM | POA: Diagnosis not present

## 2022-03-19 DIAGNOSIS — M4692 Unspecified inflammatory spondylopathy, cervical region: Secondary | ICD-10-CM | POA: Diagnosis not present

## 2022-03-19 DIAGNOSIS — M1990 Unspecified osteoarthritis, unspecified site: Secondary | ICD-10-CM | POA: Diagnosis not present

## 2022-03-19 DIAGNOSIS — R293 Abnormal posture: Secondary | ICD-10-CM | POA: Diagnosis not present

## 2022-03-19 DIAGNOSIS — E1122 Type 2 diabetes mellitus with diabetic chronic kidney disease: Secondary | ICD-10-CM | POA: Diagnosis not present

## 2022-03-19 DIAGNOSIS — I7121 Aneurysm of the ascending aorta, without rupture: Secondary | ICD-10-CM | POA: Diagnosis not present

## 2022-03-19 DIAGNOSIS — Z741 Need for assistance with personal care: Secondary | ICD-10-CM | POA: Diagnosis not present

## 2022-03-20 DIAGNOSIS — E1122 Type 2 diabetes mellitus with diabetic chronic kidney disease: Secondary | ICD-10-CM | POA: Diagnosis not present

## 2022-03-20 DIAGNOSIS — R1310 Dysphagia, unspecified: Secondary | ICD-10-CM | POA: Diagnosis not present

## 2022-03-20 DIAGNOSIS — M1990 Unspecified osteoarthritis, unspecified site: Secondary | ICD-10-CM | POA: Diagnosis not present

## 2022-03-20 DIAGNOSIS — I639 Cerebral infarction, unspecified: Secondary | ICD-10-CM | POA: Diagnosis not present

## 2022-03-20 DIAGNOSIS — M4692 Unspecified inflammatory spondylopathy, cervical region: Secondary | ICD-10-CM | POA: Diagnosis not present

## 2022-03-20 DIAGNOSIS — M6281 Muscle weakness (generalized): Secondary | ICD-10-CM | POA: Diagnosis not present

## 2022-03-20 DIAGNOSIS — I7121 Aneurysm of the ascending aorta, without rupture: Secondary | ICD-10-CM | POA: Diagnosis not present

## 2022-03-20 DIAGNOSIS — R52 Pain, unspecified: Secondary | ICD-10-CM | POA: Diagnosis not present

## 2022-03-20 DIAGNOSIS — R2689 Other abnormalities of gait and mobility: Secondary | ICD-10-CM | POA: Diagnosis not present

## 2022-03-20 DIAGNOSIS — M4699 Unspecified inflammatory spondylopathy, multiple sites in spine: Secondary | ICD-10-CM | POA: Diagnosis not present

## 2022-03-20 DIAGNOSIS — R293 Abnormal posture: Secondary | ICD-10-CM | POA: Diagnosis not present

## 2022-03-20 DIAGNOSIS — R278 Other lack of coordination: Secondary | ICD-10-CM | POA: Diagnosis not present

## 2022-03-20 DIAGNOSIS — Z741 Need for assistance with personal care: Secondary | ICD-10-CM | POA: Diagnosis not present

## 2022-03-20 DIAGNOSIS — I69354 Hemiplegia and hemiparesis following cerebral infarction affecting left non-dominant side: Secondary | ICD-10-CM | POA: Diagnosis not present

## 2022-03-21 DIAGNOSIS — Z743 Need for continuous supervision: Secondary | ICD-10-CM | POA: Diagnosis not present

## 2022-03-21 DIAGNOSIS — Z7982 Long term (current) use of aspirin: Secondary | ICD-10-CM | POA: Diagnosis not present

## 2022-03-21 DIAGNOSIS — N184 Chronic kidney disease, stage 4 (severe): Secondary | ICD-10-CM | POA: Diagnosis not present

## 2022-03-21 DIAGNOSIS — I150 Renovascular hypertension: Secondary | ICD-10-CM | POA: Diagnosis not present

## 2022-03-21 DIAGNOSIS — R1110 Vomiting, unspecified: Secondary | ICD-10-CM | POA: Diagnosis not present

## 2022-03-21 DIAGNOSIS — R918 Other nonspecific abnormal finding of lung field: Secondary | ICD-10-CM | POA: Diagnosis not present

## 2022-03-21 DIAGNOSIS — J9601 Acute respiratory failure with hypoxia: Secondary | ICD-10-CM | POA: Diagnosis not present

## 2022-03-21 DIAGNOSIS — I421 Obstructive hypertrophic cardiomyopathy: Secondary | ICD-10-CM | POA: Diagnosis not present

## 2022-03-21 DIAGNOSIS — R778 Other specified abnormalities of plasma proteins: Secondary | ICD-10-CM | POA: Diagnosis not present

## 2022-03-21 DIAGNOSIS — I249 Acute ischemic heart disease, unspecified: Secondary | ICD-10-CM | POA: Diagnosis not present

## 2022-03-21 DIAGNOSIS — N1832 Chronic kidney disease, stage 3b: Secondary | ICD-10-CM | POA: Diagnosis not present

## 2022-03-21 DIAGNOSIS — R2689 Other abnormalities of gait and mobility: Secondary | ICD-10-CM | POA: Diagnosis not present

## 2022-03-21 DIAGNOSIS — R279 Unspecified lack of coordination: Secondary | ICD-10-CM | POA: Diagnosis not present

## 2022-03-21 DIAGNOSIS — I639 Cerebral infarction, unspecified: Secondary | ICD-10-CM | POA: Diagnosis not present

## 2022-03-21 DIAGNOSIS — I440 Atrioventricular block, first degree: Secondary | ICD-10-CM | POA: Diagnosis not present

## 2022-03-21 DIAGNOSIS — I428 Other cardiomyopathies: Secondary | ICD-10-CM | POA: Diagnosis not present

## 2022-03-21 DIAGNOSIS — N189 Chronic kidney disease, unspecified: Secondary | ICD-10-CM | POA: Diagnosis not present

## 2022-03-21 DIAGNOSIS — I69314 Frontal lobe and executive function deficit following cerebral infarction: Secondary | ICD-10-CM | POA: Diagnosis not present

## 2022-03-21 DIAGNOSIS — I10 Essential (primary) hypertension: Secondary | ICD-10-CM | POA: Diagnosis not present

## 2022-03-21 DIAGNOSIS — Z808 Family history of malignant neoplasm of other organs or systems: Secondary | ICD-10-CM | POA: Diagnosis not present

## 2022-03-21 DIAGNOSIS — I422 Other hypertrophic cardiomyopathy: Secondary | ICD-10-CM | POA: Diagnosis not present

## 2022-03-21 DIAGNOSIS — I25110 Atherosclerotic heart disease of native coronary artery with unstable angina pectoris: Secondary | ICD-10-CM | POA: Diagnosis not present

## 2022-03-21 DIAGNOSIS — Z8616 Personal history of COVID-19: Secondary | ICD-10-CM | POA: Diagnosis not present

## 2022-03-21 DIAGNOSIS — I2510 Atherosclerotic heart disease of native coronary artery without angina pectoris: Secondary | ICD-10-CM | POA: Diagnosis not present

## 2022-03-21 DIAGNOSIS — E872 Acidosis, unspecified: Secondary | ICD-10-CM | POA: Diagnosis not present

## 2022-03-21 DIAGNOSIS — J1282 Pneumonia due to coronavirus disease 2019: Secondary | ICD-10-CM | POA: Diagnosis not present

## 2022-03-21 DIAGNOSIS — R1310 Dysphagia, unspecified: Secondary | ICD-10-CM | POA: Diagnosis not present

## 2022-03-21 DIAGNOSIS — I499 Cardiac arrhythmia, unspecified: Secondary | ICD-10-CM | POA: Diagnosis not present

## 2022-03-21 DIAGNOSIS — R6889 Other general symptoms and signs: Secondary | ICD-10-CM | POA: Diagnosis not present

## 2022-03-21 DIAGNOSIS — I213 ST elevation (STEMI) myocardial infarction of unspecified site: Secondary | ICD-10-CM | POA: Diagnosis not present

## 2022-03-21 DIAGNOSIS — I509 Heart failure, unspecified: Secondary | ICD-10-CM | POA: Diagnosis not present

## 2022-03-21 DIAGNOSIS — E782 Mixed hyperlipidemia: Secondary | ICD-10-CM | POA: Diagnosis not present

## 2022-03-21 DIAGNOSIS — R9431 Abnormal electrocardiogram [ECG] [EKG]: Secondary | ICD-10-CM | POA: Diagnosis not present

## 2022-03-21 DIAGNOSIS — I679 Cerebrovascular disease, unspecified: Secondary | ICD-10-CM | POA: Diagnosis not present

## 2022-03-21 DIAGNOSIS — R293 Abnormal posture: Secondary | ICD-10-CM | POA: Diagnosis not present

## 2022-03-21 DIAGNOSIS — I110 Hypertensive heart disease with heart failure: Secondary | ICD-10-CM | POA: Diagnosis not present

## 2022-03-21 DIAGNOSIS — D72829 Elevated white blood cell count, unspecified: Secondary | ICD-10-CM | POA: Diagnosis not present

## 2022-03-21 DIAGNOSIS — N390 Urinary tract infection, site not specified: Secondary | ICD-10-CM | POA: Diagnosis not present

## 2022-03-21 DIAGNOSIS — D631 Anemia in chronic kidney disease: Secondary | ICD-10-CM | POA: Diagnosis not present

## 2022-03-21 DIAGNOSIS — M4699 Unspecified inflammatory spondylopathy, multiple sites in spine: Secondary | ICD-10-CM | POA: Diagnosis not present

## 2022-03-21 DIAGNOSIS — R8290 Unspecified abnormal findings in urine: Secondary | ICD-10-CM | POA: Diagnosis not present

## 2022-03-21 DIAGNOSIS — M4692 Unspecified inflammatory spondylopathy, cervical region: Secondary | ICD-10-CM | POA: Diagnosis not present

## 2022-03-21 DIAGNOSIS — R110 Nausea: Secondary | ICD-10-CM | POA: Diagnosis not present

## 2022-03-21 DIAGNOSIS — E1122 Type 2 diabetes mellitus with diabetic chronic kidney disease: Secondary | ICD-10-CM | POA: Diagnosis not present

## 2022-03-21 DIAGNOSIS — N183 Chronic kidney disease, stage 3 unspecified: Secondary | ICD-10-CM | POA: Diagnosis not present

## 2022-03-21 DIAGNOSIS — R278 Other lack of coordination: Secondary | ICD-10-CM | POA: Diagnosis not present

## 2022-03-21 DIAGNOSIS — R079 Chest pain, unspecified: Secondary | ICD-10-CM | POA: Diagnosis not present

## 2022-03-21 DIAGNOSIS — Z741 Need for assistance with personal care: Secondary | ICD-10-CM | POA: Diagnosis not present

## 2022-03-21 DIAGNOSIS — Z1612 Extended spectrum beta lactamase (ESBL) resistance: Secondary | ICD-10-CM | POA: Diagnosis not present

## 2022-03-21 DIAGNOSIS — N179 Acute kidney failure, unspecified: Secondary | ICD-10-CM | POA: Diagnosis not present

## 2022-03-21 DIAGNOSIS — R112 Nausea with vomiting, unspecified: Secondary | ICD-10-CM | POA: Diagnosis not present

## 2022-03-21 DIAGNOSIS — R5381 Other malaise: Secondary | ICD-10-CM | POA: Diagnosis not present

## 2022-03-21 DIAGNOSIS — R0602 Shortness of breath: Secondary | ICD-10-CM | POA: Diagnosis not present

## 2022-03-21 DIAGNOSIS — R410 Disorientation, unspecified: Secondary | ICD-10-CM | POA: Diagnosis not present

## 2022-03-21 DIAGNOSIS — I5181 Takotsubo syndrome: Secondary | ICD-10-CM | POA: Diagnosis not present

## 2022-03-21 DIAGNOSIS — F32A Depression, unspecified: Secondary | ICD-10-CM | POA: Diagnosis not present

## 2022-03-21 DIAGNOSIS — I69354 Hemiplegia and hemiparesis following cerebral infarction affecting left non-dominant side: Secondary | ICD-10-CM | POA: Diagnosis not present

## 2022-03-21 DIAGNOSIS — Z8679 Personal history of other diseases of the circulatory system: Secondary | ICD-10-CM | POA: Diagnosis not present

## 2022-03-21 DIAGNOSIS — I252 Old myocardial infarction: Secondary | ICD-10-CM | POA: Diagnosis not present

## 2022-03-21 DIAGNOSIS — I129 Hypertensive chronic kidney disease with stage 1 through stage 4 chronic kidney disease, or unspecified chronic kidney disease: Secondary | ICD-10-CM | POA: Diagnosis not present

## 2022-03-21 DIAGNOSIS — I491 Atrial premature depolarization: Secondary | ICD-10-CM | POA: Diagnosis not present

## 2022-03-21 DIAGNOSIS — R531 Weakness: Secondary | ICD-10-CM | POA: Diagnosis not present

## 2022-03-21 DIAGNOSIS — I77810 Thoracic aortic ectasia: Secondary | ICD-10-CM | POA: Diagnosis not present

## 2022-03-21 DIAGNOSIS — M1990 Unspecified osteoarthritis, unspecified site: Secondary | ICD-10-CM | POA: Diagnosis not present

## 2022-03-21 DIAGNOSIS — I7789 Other specified disorders of arteries and arterioles: Secondary | ICD-10-CM | POA: Diagnosis not present

## 2022-03-21 DIAGNOSIS — U071 COVID-19: Secondary | ICD-10-CM | POA: Diagnosis not present

## 2022-03-21 DIAGNOSIS — M6281 Muscle weakness (generalized): Secondary | ICD-10-CM | POA: Diagnosis not present

## 2022-03-21 DIAGNOSIS — Z8673 Personal history of transient ischemic attack (TIA), and cerebral infarction without residual deficits: Secondary | ICD-10-CM | POA: Diagnosis not present

## 2022-03-21 DIAGNOSIS — I7121 Aneurysm of the ascending aorta, without rupture: Secondary | ICD-10-CM | POA: Diagnosis not present

## 2022-03-21 DIAGNOSIS — I2119 ST elevation (STEMI) myocardial infarction involving other coronary artery of inferior wall: Secondary | ICD-10-CM | POA: Diagnosis not present

## 2022-03-21 DIAGNOSIS — R52 Pain, unspecified: Secondary | ICD-10-CM | POA: Diagnosis not present

## 2022-03-21 DIAGNOSIS — R339 Retention of urine, unspecified: Secondary | ICD-10-CM | POA: Diagnosis not present

## 2022-03-21 DIAGNOSIS — I498 Other specified cardiac arrhythmias: Secondary | ICD-10-CM | POA: Diagnosis not present

## 2022-03-21 DIAGNOSIS — N1831 Chronic kidney disease, stage 3a: Secondary | ICD-10-CM | POA: Diagnosis not present

## 2022-03-21 DIAGNOSIS — I4581 Long QT syndrome: Secondary | ICD-10-CM | POA: Diagnosis not present

## 2022-03-25 ENCOUNTER — Institutional Professional Consult (permissible substitution): Payer: Medicare Other | Admitting: Internal Medicine

## 2022-03-26 DIAGNOSIS — I25110 Atherosclerotic heart disease of native coronary artery with unstable angina pectoris: Secondary | ICD-10-CM | POA: Diagnosis not present

## 2022-03-26 DIAGNOSIS — R9431 Abnormal electrocardiogram [ECG] [EKG]: Secondary | ICD-10-CM | POA: Diagnosis not present

## 2022-03-26 DIAGNOSIS — Z8673 Personal history of transient ischemic attack (TIA), and cerebral infarction without residual deficits: Secondary | ICD-10-CM | POA: Diagnosis not present

## 2022-03-26 DIAGNOSIS — N1832 Chronic kidney disease, stage 3b: Secondary | ICD-10-CM | POA: Diagnosis not present

## 2022-03-26 DIAGNOSIS — E872 Acidosis, unspecified: Secondary | ICD-10-CM | POA: Diagnosis not present

## 2022-03-26 DIAGNOSIS — D631 Anemia in chronic kidney disease: Secondary | ICD-10-CM | POA: Diagnosis not present

## 2022-03-26 DIAGNOSIS — R531 Weakness: Secondary | ICD-10-CM | POA: Diagnosis not present

## 2022-03-26 DIAGNOSIS — U071 COVID-19: Secondary | ICD-10-CM | POA: Diagnosis not present

## 2022-03-26 DIAGNOSIS — I129 Hypertensive chronic kidney disease with stage 1 through stage 4 chronic kidney disease, or unspecified chronic kidney disease: Secondary | ICD-10-CM | POA: Diagnosis not present

## 2022-03-26 DIAGNOSIS — Z8679 Personal history of other diseases of the circulatory system: Secondary | ICD-10-CM | POA: Diagnosis not present

## 2022-03-26 DIAGNOSIS — R410 Disorientation, unspecified: Secondary | ICD-10-CM | POA: Diagnosis not present

## 2022-03-26 DIAGNOSIS — I421 Obstructive hypertrophic cardiomyopathy: Secondary | ICD-10-CM | POA: Diagnosis not present

## 2022-03-26 DIAGNOSIS — I498 Other specified cardiac arrhythmias: Secondary | ICD-10-CM | POA: Diagnosis not present

## 2022-03-26 DIAGNOSIS — R5381 Other malaise: Secondary | ICD-10-CM | POA: Diagnosis not present

## 2022-03-26 DIAGNOSIS — R6889 Other general symptoms and signs: Secondary | ICD-10-CM | POA: Diagnosis not present

## 2022-03-26 DIAGNOSIS — I69314 Frontal lobe and executive function deficit following cerebral infarction: Secondary | ICD-10-CM | POA: Diagnosis not present

## 2022-03-26 DIAGNOSIS — R110 Nausea: Secondary | ICD-10-CM | POA: Diagnosis not present

## 2022-03-26 DIAGNOSIS — N390 Urinary tract infection, site not specified: Secondary | ICD-10-CM | POA: Diagnosis not present

## 2022-03-26 DIAGNOSIS — I679 Cerebrovascular disease, unspecified: Secondary | ICD-10-CM | POA: Diagnosis not present

## 2022-03-26 DIAGNOSIS — R1110 Vomiting, unspecified: Secondary | ICD-10-CM | POA: Diagnosis not present

## 2022-03-26 DIAGNOSIS — N179 Acute kidney failure, unspecified: Secondary | ICD-10-CM | POA: Diagnosis not present

## 2022-03-26 DIAGNOSIS — Z1612 Extended spectrum beta lactamase (ESBL) resistance: Secondary | ICD-10-CM | POA: Diagnosis not present

## 2022-03-26 DIAGNOSIS — R778 Other specified abnormalities of plasma proteins: Secondary | ICD-10-CM | POA: Diagnosis not present

## 2022-03-26 DIAGNOSIS — M4699 Unspecified inflammatory spondylopathy, multiple sites in spine: Secondary | ICD-10-CM | POA: Diagnosis not present

## 2022-03-26 DIAGNOSIS — I5181 Takotsubo syndrome: Secondary | ICD-10-CM | POA: Diagnosis not present

## 2022-03-26 DIAGNOSIS — R0602 Shortness of breath: Secondary | ICD-10-CM | POA: Diagnosis not present

## 2022-03-26 DIAGNOSIS — N184 Chronic kidney disease, stage 4 (severe): Secondary | ICD-10-CM | POA: Diagnosis not present

## 2022-03-26 DIAGNOSIS — I10 Essential (primary) hypertension: Secondary | ICD-10-CM | POA: Diagnosis not present

## 2022-03-26 DIAGNOSIS — I69354 Hemiplegia and hemiparesis following cerebral infarction affecting left non-dominant side: Secondary | ICD-10-CM | POA: Diagnosis not present

## 2022-03-26 DIAGNOSIS — I422 Other hypertrophic cardiomyopathy: Secondary | ICD-10-CM | POA: Diagnosis not present

## 2022-03-26 DIAGNOSIS — I213 ST elevation (STEMI) myocardial infarction of unspecified site: Secondary | ICD-10-CM | POA: Diagnosis not present

## 2022-03-26 DIAGNOSIS — I440 Atrioventricular block, first degree: Secondary | ICD-10-CM | POA: Diagnosis not present

## 2022-03-26 DIAGNOSIS — R112 Nausea with vomiting, unspecified: Secondary | ICD-10-CM | POA: Diagnosis not present

## 2022-03-26 DIAGNOSIS — R339 Retention of urine, unspecified: Secondary | ICD-10-CM | POA: Diagnosis not present

## 2022-03-26 DIAGNOSIS — I509 Heart failure, unspecified: Secondary | ICD-10-CM | POA: Diagnosis not present

## 2022-03-26 DIAGNOSIS — E782 Mixed hyperlipidemia: Secondary | ICD-10-CM | POA: Diagnosis not present

## 2022-03-26 DIAGNOSIS — E1122 Type 2 diabetes mellitus with diabetic chronic kidney disease: Secondary | ICD-10-CM | POA: Diagnosis not present

## 2022-03-26 DIAGNOSIS — I77810 Thoracic aortic ectasia: Secondary | ICD-10-CM | POA: Diagnosis not present

## 2022-03-26 DIAGNOSIS — I252 Old myocardial infarction: Secondary | ICD-10-CM | POA: Diagnosis not present

## 2022-03-26 DIAGNOSIS — Z743 Need for continuous supervision: Secondary | ICD-10-CM | POA: Diagnosis not present

## 2022-03-26 DIAGNOSIS — I2510 Atherosclerotic heart disease of native coronary artery without angina pectoris: Secondary | ICD-10-CM | POA: Diagnosis not present

## 2022-03-28 DIAGNOSIS — I509 Heart failure, unspecified: Secondary | ICD-10-CM | POA: Diagnosis not present

## 2022-03-28 DIAGNOSIS — N1832 Chronic kidney disease, stage 3b: Secondary | ICD-10-CM | POA: Diagnosis not present

## 2022-03-28 DIAGNOSIS — R110 Nausea: Secondary | ICD-10-CM | POA: Diagnosis not present

## 2022-03-28 DIAGNOSIS — R1110 Vomiting, unspecified: Secondary | ICD-10-CM | POA: Diagnosis not present

## 2022-03-28 DIAGNOSIS — R5381 Other malaise: Secondary | ICD-10-CM | POA: Diagnosis not present

## 2022-03-28 DIAGNOSIS — U071 COVID-19: Secondary | ICD-10-CM | POA: Diagnosis not present

## 2022-03-28 DIAGNOSIS — I69354 Hemiplegia and hemiparesis following cerebral infarction affecting left non-dominant side: Secondary | ICD-10-CM | POA: Diagnosis not present

## 2022-03-28 DIAGNOSIS — R9431 Abnormal electrocardiogram [ECG] [EKG]: Secondary | ICD-10-CM | POA: Diagnosis not present

## 2022-03-28 DIAGNOSIS — I5181 Takotsubo syndrome: Secondary | ICD-10-CM | POA: Diagnosis not present

## 2022-03-28 DIAGNOSIS — I77810 Thoracic aortic ectasia: Secondary | ICD-10-CM | POA: Diagnosis not present

## 2022-03-28 DIAGNOSIS — R778 Other specified abnormalities of plasma proteins: Secondary | ICD-10-CM | POA: Diagnosis not present

## 2022-03-28 DIAGNOSIS — I10 Essential (primary) hypertension: Secondary | ICD-10-CM | POA: Diagnosis not present

## 2022-03-28 DIAGNOSIS — I252 Old myocardial infarction: Secondary | ICD-10-CM | POA: Diagnosis not present

## 2022-03-28 DIAGNOSIS — R0602 Shortness of breath: Secondary | ICD-10-CM | POA: Diagnosis not present

## 2022-03-28 DIAGNOSIS — Z8679 Personal history of other diseases of the circulatory system: Secondary | ICD-10-CM | POA: Diagnosis not present

## 2022-03-28 DIAGNOSIS — E782 Mixed hyperlipidemia: Secondary | ICD-10-CM | POA: Diagnosis not present

## 2022-03-28 DIAGNOSIS — I129 Hypertensive chronic kidney disease with stage 1 through stage 4 chronic kidney disease, or unspecified chronic kidney disease: Secondary | ICD-10-CM | POA: Diagnosis not present

## 2022-03-28 DIAGNOSIS — E1122 Type 2 diabetes mellitus with diabetic chronic kidney disease: Secondary | ICD-10-CM | POA: Diagnosis not present

## 2022-03-28 DIAGNOSIS — I2510 Atherosclerotic heart disease of native coronary artery without angina pectoris: Secondary | ICD-10-CM | POA: Diagnosis not present

## 2022-03-28 DIAGNOSIS — I422 Other hypertrophic cardiomyopathy: Secondary | ICD-10-CM | POA: Diagnosis not present

## 2022-03-28 DIAGNOSIS — Z8673 Personal history of transient ischemic attack (TIA), and cerebral infarction without residual deficits: Secondary | ICD-10-CM | POA: Diagnosis not present

## 2022-03-28 DIAGNOSIS — R112 Nausea with vomiting, unspecified: Secondary | ICD-10-CM | POA: Diagnosis not present

## 2022-03-28 DIAGNOSIS — N184 Chronic kidney disease, stage 4 (severe): Secondary | ICD-10-CM | POA: Diagnosis not present

## 2022-03-28 DIAGNOSIS — I25110 Atherosclerotic heart disease of native coronary artery with unstable angina pectoris: Secondary | ICD-10-CM | POA: Diagnosis not present

## 2022-03-28 DIAGNOSIS — D631 Anemia in chronic kidney disease: Secondary | ICD-10-CM | POA: Diagnosis not present

## 2022-03-29 DIAGNOSIS — I249 Acute ischemic heart disease, unspecified: Secondary | ICD-10-CM | POA: Diagnosis not present

## 2022-03-29 DIAGNOSIS — D72829 Elevated white blood cell count, unspecified: Secondary | ICD-10-CM | POA: Diagnosis not present

## 2022-03-29 DIAGNOSIS — I2510 Atherosclerotic heart disease of native coronary artery without angina pectoris: Secondary | ICD-10-CM | POA: Diagnosis not present

## 2022-03-29 DIAGNOSIS — I213 ST elevation (STEMI) myocardial infarction of unspecified site: Secondary | ICD-10-CM | POA: Diagnosis not present

## 2022-03-29 DIAGNOSIS — Z8673 Personal history of transient ischemic attack (TIA), and cerebral infarction without residual deficits: Secondary | ICD-10-CM | POA: Diagnosis not present

## 2022-03-29 DIAGNOSIS — I129 Hypertensive chronic kidney disease with stage 1 through stage 4 chronic kidney disease, or unspecified chronic kidney disease: Secondary | ICD-10-CM | POA: Diagnosis not present

## 2022-03-29 DIAGNOSIS — N184 Chronic kidney disease, stage 4 (severe): Secondary | ICD-10-CM | POA: Diagnosis not present

## 2022-03-29 DIAGNOSIS — N1832 Chronic kidney disease, stage 3b: Secondary | ICD-10-CM | POA: Diagnosis not present

## 2022-03-29 DIAGNOSIS — R1110 Vomiting, unspecified: Secondary | ICD-10-CM | POA: Diagnosis not present

## 2022-03-29 DIAGNOSIS — Z8679 Personal history of other diseases of the circulatory system: Secondary | ICD-10-CM | POA: Diagnosis not present

## 2022-03-30 ENCOUNTER — Encounter: Payer: Self-pay | Admitting: Cardiology

## 2022-03-30 DIAGNOSIS — I491 Atrial premature depolarization: Secondary | ICD-10-CM | POA: Diagnosis not present

## 2022-03-30 DIAGNOSIS — N184 Chronic kidney disease, stage 4 (severe): Secondary | ICD-10-CM | POA: Diagnosis not present

## 2022-03-30 DIAGNOSIS — I25110 Atherosclerotic heart disease of native coronary artery with unstable angina pectoris: Secondary | ICD-10-CM | POA: Diagnosis not present

## 2022-03-30 DIAGNOSIS — Z8616 Personal history of COVID-19: Secondary | ICD-10-CM | POA: Diagnosis not present

## 2022-03-30 DIAGNOSIS — N1832 Chronic kidney disease, stage 3b: Secondary | ICD-10-CM | POA: Diagnosis not present

## 2022-03-30 DIAGNOSIS — I440 Atrioventricular block, first degree: Secondary | ICD-10-CM | POA: Diagnosis not present

## 2022-03-30 DIAGNOSIS — I129 Hypertensive chronic kidney disease with stage 1 through stage 4 chronic kidney disease, or unspecified chronic kidney disease: Secondary | ICD-10-CM | POA: Diagnosis not present

## 2022-03-30 DIAGNOSIS — D72829 Elevated white blood cell count, unspecified: Secondary | ICD-10-CM | POA: Diagnosis not present

## 2022-03-30 DIAGNOSIS — R112 Nausea with vomiting, unspecified: Secondary | ICD-10-CM | POA: Diagnosis not present

## 2022-03-30 DIAGNOSIS — E782 Mixed hyperlipidemia: Secondary | ICD-10-CM | POA: Diagnosis not present

## 2022-03-30 DIAGNOSIS — I213 ST elevation (STEMI) myocardial infarction of unspecified site: Secondary | ICD-10-CM | POA: Diagnosis not present

## 2022-03-30 DIAGNOSIS — I2510 Atherosclerotic heart disease of native coronary artery without angina pectoris: Secondary | ICD-10-CM | POA: Diagnosis not present

## 2022-03-30 DIAGNOSIS — R778 Other specified abnormalities of plasma proteins: Secondary | ICD-10-CM | POA: Diagnosis not present

## 2022-03-30 DIAGNOSIS — I249 Acute ischemic heart disease, unspecified: Secondary | ICD-10-CM | POA: Diagnosis not present

## 2022-03-31 DIAGNOSIS — I129 Hypertensive chronic kidney disease with stage 1 through stage 4 chronic kidney disease, or unspecified chronic kidney disease: Secondary | ICD-10-CM | POA: Diagnosis not present

## 2022-03-31 DIAGNOSIS — I2510 Atherosclerotic heart disease of native coronary artery without angina pectoris: Secondary | ICD-10-CM | POA: Diagnosis not present

## 2022-03-31 DIAGNOSIS — N184 Chronic kidney disease, stage 4 (severe): Secondary | ICD-10-CM | POA: Diagnosis not present

## 2022-03-31 DIAGNOSIS — D72829 Elevated white blood cell count, unspecified: Secondary | ICD-10-CM | POA: Diagnosis not present

## 2022-03-31 DIAGNOSIS — Z8616 Personal history of COVID-19: Secondary | ICD-10-CM | POA: Diagnosis not present

## 2022-03-31 DIAGNOSIS — I249 Acute ischemic heart disease, unspecified: Secondary | ICD-10-CM | POA: Diagnosis not present

## 2022-04-01 DIAGNOSIS — I129 Hypertensive chronic kidney disease with stage 1 through stage 4 chronic kidney disease, or unspecified chronic kidney disease: Secondary | ICD-10-CM | POA: Diagnosis not present

## 2022-04-01 DIAGNOSIS — R1310 Dysphagia, unspecified: Secondary | ICD-10-CM | POA: Diagnosis not present

## 2022-04-01 DIAGNOSIS — N184 Chronic kidney disease, stage 4 (severe): Secondary | ICD-10-CM | POA: Diagnosis not present

## 2022-04-01 DIAGNOSIS — D72829 Elevated white blood cell count, unspecified: Secondary | ICD-10-CM | POA: Diagnosis not present

## 2022-04-01 DIAGNOSIS — I249 Acute ischemic heart disease, unspecified: Secondary | ICD-10-CM | POA: Diagnosis not present

## 2022-04-01 NOTE — Telephone Encounter (Signed)
Can we schedule her an appointment with me for when she gets out of the hospital?  I think would be best to discuss in person

## 2022-04-02 DIAGNOSIS — N184 Chronic kidney disease, stage 4 (severe): Secondary | ICD-10-CM | POA: Diagnosis not present

## 2022-04-02 DIAGNOSIS — I249 Acute ischemic heart disease, unspecified: Secondary | ICD-10-CM | POA: Diagnosis not present

## 2022-04-02 DIAGNOSIS — R1310 Dysphagia, unspecified: Secondary | ICD-10-CM | POA: Diagnosis not present

## 2022-04-02 DIAGNOSIS — D72829 Elevated white blood cell count, unspecified: Secondary | ICD-10-CM | POA: Diagnosis not present

## 2022-04-02 DIAGNOSIS — I129 Hypertensive chronic kidney disease with stage 1 through stage 4 chronic kidney disease, or unspecified chronic kidney disease: Secondary | ICD-10-CM | POA: Diagnosis not present

## 2022-04-02 DIAGNOSIS — R079 Chest pain, unspecified: Secondary | ICD-10-CM | POA: Diagnosis not present

## 2022-04-02 DIAGNOSIS — N189 Chronic kidney disease, unspecified: Secondary | ICD-10-CM | POA: Diagnosis not present

## 2022-04-02 DIAGNOSIS — Z8679 Personal history of other diseases of the circulatory system: Secondary | ICD-10-CM | POA: Diagnosis not present

## 2022-04-02 DIAGNOSIS — R5381 Other malaise: Secondary | ICD-10-CM | POA: Diagnosis not present

## 2022-04-02 DIAGNOSIS — I213 ST elevation (STEMI) myocardial infarction of unspecified site: Secondary | ICD-10-CM | POA: Diagnosis not present

## 2022-04-02 DIAGNOSIS — Z8616 Personal history of COVID-19: Secondary | ICD-10-CM | POA: Diagnosis not present

## 2022-04-03 DIAGNOSIS — R1310 Dysphagia, unspecified: Secondary | ICD-10-CM | POA: Diagnosis not present

## 2022-04-03 DIAGNOSIS — I129 Hypertensive chronic kidney disease with stage 1 through stage 4 chronic kidney disease, or unspecified chronic kidney disease: Secondary | ICD-10-CM | POA: Diagnosis not present

## 2022-04-03 DIAGNOSIS — I249 Acute ischemic heart disease, unspecified: Secondary | ICD-10-CM | POA: Diagnosis not present

## 2022-04-03 DIAGNOSIS — Z8679 Personal history of other diseases of the circulatory system: Secondary | ICD-10-CM | POA: Diagnosis not present

## 2022-04-03 DIAGNOSIS — Z7982 Long term (current) use of aspirin: Secondary | ICD-10-CM | POA: Diagnosis not present

## 2022-04-03 DIAGNOSIS — N184 Chronic kidney disease, stage 4 (severe): Secondary | ICD-10-CM | POA: Diagnosis not present

## 2022-04-03 DIAGNOSIS — D72829 Elevated white blood cell count, unspecified: Secondary | ICD-10-CM | POA: Diagnosis not present

## 2022-04-03 DIAGNOSIS — I2119 ST elevation (STEMI) myocardial infarction involving other coronary artery of inferior wall: Secondary | ICD-10-CM | POA: Diagnosis not present

## 2022-04-03 DIAGNOSIS — Z8616 Personal history of COVID-19: Secondary | ICD-10-CM | POA: Diagnosis not present

## 2022-04-03 DIAGNOSIS — I10 Essential (primary) hypertension: Secondary | ICD-10-CM | POA: Diagnosis not present

## 2022-04-03 DIAGNOSIS — E872 Acidosis, unspecified: Secondary | ICD-10-CM | POA: Diagnosis not present

## 2022-04-03 DIAGNOSIS — I2510 Atherosclerotic heart disease of native coronary artery without angina pectoris: Secondary | ICD-10-CM | POA: Diagnosis not present

## 2022-04-03 DIAGNOSIS — E782 Mixed hyperlipidemia: Secondary | ICD-10-CM | POA: Diagnosis not present

## 2022-04-03 DIAGNOSIS — I25110 Atherosclerotic heart disease of native coronary artery with unstable angina pectoris: Secondary | ICD-10-CM | POA: Diagnosis not present

## 2022-04-04 DIAGNOSIS — R2689 Other abnormalities of gait and mobility: Secondary | ICD-10-CM | POA: Diagnosis not present

## 2022-04-04 DIAGNOSIS — D72829 Elevated white blood cell count, unspecified: Secondary | ICD-10-CM | POA: Diagnosis not present

## 2022-04-04 DIAGNOSIS — E872 Acidosis, unspecified: Secondary | ICD-10-CM | POA: Diagnosis not present

## 2022-04-04 DIAGNOSIS — N184 Chronic kidney disease, stage 4 (severe): Secondary | ICD-10-CM | POA: Diagnosis not present

## 2022-04-04 DIAGNOSIS — I129 Hypertensive chronic kidney disease with stage 1 through stage 4 chronic kidney disease, or unspecified chronic kidney disease: Secondary | ICD-10-CM | POA: Diagnosis not present

## 2022-04-04 DIAGNOSIS — Z8616 Personal history of COVID-19: Secondary | ICD-10-CM | POA: Diagnosis not present

## 2022-04-04 DIAGNOSIS — I421 Obstructive hypertrophic cardiomyopathy: Secondary | ICD-10-CM | POA: Diagnosis not present

## 2022-04-04 DIAGNOSIS — M6281 Muscle weakness (generalized): Secondary | ICD-10-CM | POA: Diagnosis not present

## 2022-04-04 DIAGNOSIS — I639 Cerebral infarction, unspecified: Secondary | ICD-10-CM | POA: Diagnosis not present

## 2022-04-04 DIAGNOSIS — E782 Mixed hyperlipidemia: Secondary | ICD-10-CM | POA: Diagnosis not present

## 2022-04-04 DIAGNOSIS — R1310 Dysphagia, unspecified: Secondary | ICD-10-CM | POA: Diagnosis not present

## 2022-04-04 DIAGNOSIS — I25110 Atherosclerotic heart disease of native coronary artery with unstable angina pectoris: Secondary | ICD-10-CM | POA: Diagnosis not present

## 2022-04-04 DIAGNOSIS — Z8679 Personal history of other diseases of the circulatory system: Secondary | ICD-10-CM | POA: Diagnosis not present

## 2022-04-08 DIAGNOSIS — I421 Obstructive hypertrophic cardiomyopathy: Secondary | ICD-10-CM | POA: Diagnosis not present

## 2022-04-08 DIAGNOSIS — R1312 Dysphagia, oropharyngeal phase: Secondary | ICD-10-CM | POA: Diagnosis not present

## 2022-04-08 DIAGNOSIS — R278 Other lack of coordination: Secondary | ICD-10-CM | POA: Diagnosis not present

## 2022-04-08 DIAGNOSIS — I5181 Takotsubo syndrome: Secondary | ICD-10-CM | POA: Diagnosis not present

## 2022-04-08 DIAGNOSIS — I639 Cerebral infarction, unspecified: Secondary | ICD-10-CM | POA: Diagnosis not present

## 2022-04-08 DIAGNOSIS — M6281 Muscle weakness (generalized): Secondary | ICD-10-CM | POA: Diagnosis not present

## 2022-04-08 DIAGNOSIS — E559 Vitamin D deficiency, unspecified: Secondary | ICD-10-CM | POA: Diagnosis not present

## 2022-04-08 DIAGNOSIS — R293 Abnormal posture: Secondary | ICD-10-CM | POA: Diagnosis not present

## 2022-04-08 DIAGNOSIS — R2689 Other abnormalities of gait and mobility: Secondary | ICD-10-CM | POA: Diagnosis not present

## 2022-04-09 ENCOUNTER — Telehealth: Payer: Self-pay | Admitting: Cardiology

## 2022-04-09 DIAGNOSIS — I421 Obstructive hypertrophic cardiomyopathy: Secondary | ICD-10-CM | POA: Diagnosis not present

## 2022-04-09 DIAGNOSIS — R293 Abnormal posture: Secondary | ICD-10-CM | POA: Diagnosis not present

## 2022-04-09 DIAGNOSIS — I639 Cerebral infarction, unspecified: Secondary | ICD-10-CM | POA: Diagnosis not present

## 2022-04-09 DIAGNOSIS — R1312 Dysphagia, oropharyngeal phase: Secondary | ICD-10-CM | POA: Diagnosis not present

## 2022-04-09 DIAGNOSIS — R278 Other lack of coordination: Secondary | ICD-10-CM | POA: Diagnosis not present

## 2022-04-09 DIAGNOSIS — R2689 Other abnormalities of gait and mobility: Secondary | ICD-10-CM | POA: Diagnosis not present

## 2022-04-09 DIAGNOSIS — M6281 Muscle weakness (generalized): Secondary | ICD-10-CM | POA: Diagnosis not present

## 2022-04-09 DIAGNOSIS — I5181 Takotsubo syndrome: Secondary | ICD-10-CM | POA: Diagnosis not present

## 2022-04-09 NOTE — Telephone Encounter (Signed)
Returned call to son (ok per DPR)-states patient is back at facility but is bedridden most the of time currently.  She got out of bed a few times yesterday but he reports she is not doing well.   He understands she needs an appointment to come in but he is unsure if he wants to put her through transporting her to and from the office at this time.   Son lives in [place].    He would like to discuss with Dr. Gardiner Rhyme if possible.       Advised would send message to MD.

## 2022-04-09 NOTE — Telephone Encounter (Signed)
I reviewed patient's recent hospitalizations from [place] and discussed with her son

## 2022-04-09 NOTE — Telephone Encounter (Signed)
New message  Pt son would like to speak to RN about recent hospital stays.

## 2022-04-10 DIAGNOSIS — R278 Other lack of coordination: Secondary | ICD-10-CM | POA: Diagnosis not present

## 2022-04-10 DIAGNOSIS — I421 Obstructive hypertrophic cardiomyopathy: Secondary | ICD-10-CM | POA: Diagnosis not present

## 2022-04-10 DIAGNOSIS — R293 Abnormal posture: Secondary | ICD-10-CM | POA: Diagnosis not present

## 2022-04-10 DIAGNOSIS — M6281 Muscle weakness (generalized): Secondary | ICD-10-CM | POA: Diagnosis not present

## 2022-04-10 DIAGNOSIS — I5181 Takotsubo syndrome: Secondary | ICD-10-CM | POA: Diagnosis not present

## 2022-04-10 DIAGNOSIS — R2689 Other abnormalities of gait and mobility: Secondary | ICD-10-CM | POA: Diagnosis not present

## 2022-04-10 DIAGNOSIS — R1312 Dysphagia, oropharyngeal phase: Secondary | ICD-10-CM | POA: Diagnosis not present

## 2022-04-10 DIAGNOSIS — I639 Cerebral infarction, unspecified: Secondary | ICD-10-CM | POA: Diagnosis not present

## 2022-04-11 DIAGNOSIS — I69354 Hemiplegia and hemiparesis following cerebral infarction affecting left non-dominant side: Secondary | ICD-10-CM | POA: Diagnosis not present

## 2022-04-11 DIAGNOSIS — I77810 Thoracic aortic ectasia: Secondary | ICD-10-CM | POA: Diagnosis not present

## 2022-04-11 DIAGNOSIS — I2109 ST elevation (STEMI) myocardial infarction involving other coronary artery of anterior wall: Secondary | ICD-10-CM | POA: Diagnosis not present

## 2022-04-11 DIAGNOSIS — D631 Anemia in chronic kidney disease: Secondary | ICD-10-CM | POA: Diagnosis not present

## 2022-04-11 DIAGNOSIS — I5181 Takotsubo syndrome: Secondary | ICD-10-CM | POA: Diagnosis not present

## 2022-04-11 DIAGNOSIS — E1122 Type 2 diabetes mellitus with diabetic chronic kidney disease: Secondary | ICD-10-CM | POA: Diagnosis not present

## 2022-04-11 DIAGNOSIS — I422 Other hypertrophic cardiomyopathy: Secondary | ICD-10-CM | POA: Diagnosis not present

## 2022-04-11 DIAGNOSIS — E871 Hypo-osmolality and hyponatremia: Secondary | ICD-10-CM | POA: Diagnosis not present

## 2022-04-11 DIAGNOSIS — N184 Chronic kidney disease, stage 4 (severe): Secondary | ICD-10-CM | POA: Diagnosis not present

## 2022-04-11 DIAGNOSIS — R5381 Other malaise: Secondary | ICD-10-CM | POA: Diagnosis not present

## 2022-04-11 DIAGNOSIS — I129 Hypertensive chronic kidney disease with stage 1 through stage 4 chronic kidney disease, or unspecified chronic kidney disease: Secondary | ICD-10-CM | POA: Diagnosis not present

## 2022-04-12 DIAGNOSIS — R293 Abnormal posture: Secondary | ICD-10-CM | POA: Diagnosis not present

## 2022-04-12 DIAGNOSIS — R278 Other lack of coordination: Secondary | ICD-10-CM | POA: Diagnosis not present

## 2022-04-12 DIAGNOSIS — M6281 Muscle weakness (generalized): Secondary | ICD-10-CM | POA: Diagnosis not present

## 2022-04-12 DIAGNOSIS — R1312 Dysphagia, oropharyngeal phase: Secondary | ICD-10-CM | POA: Diagnosis not present

## 2022-04-12 DIAGNOSIS — I421 Obstructive hypertrophic cardiomyopathy: Secondary | ICD-10-CM | POA: Diagnosis not present

## 2022-04-12 DIAGNOSIS — I639 Cerebral infarction, unspecified: Secondary | ICD-10-CM | POA: Diagnosis not present

## 2022-04-12 DIAGNOSIS — I5181 Takotsubo syndrome: Secondary | ICD-10-CM | POA: Diagnosis not present

## 2022-04-12 DIAGNOSIS — R2689 Other abnormalities of gait and mobility: Secondary | ICD-10-CM | POA: Diagnosis not present

## 2022-04-14 DIAGNOSIS — R293 Abnormal posture: Secondary | ICD-10-CM | POA: Diagnosis not present

## 2022-04-14 DIAGNOSIS — M6281 Muscle weakness (generalized): Secondary | ICD-10-CM | POA: Diagnosis not present

## 2022-04-14 DIAGNOSIS — R278 Other lack of coordination: Secondary | ICD-10-CM | POA: Diagnosis not present

## 2022-04-14 DIAGNOSIS — I5181 Takotsubo syndrome: Secondary | ICD-10-CM | POA: Diagnosis not present

## 2022-04-14 DIAGNOSIS — R1312 Dysphagia, oropharyngeal phase: Secondary | ICD-10-CM | POA: Diagnosis not present

## 2022-04-14 DIAGNOSIS — I639 Cerebral infarction, unspecified: Secondary | ICD-10-CM | POA: Diagnosis not present

## 2022-04-14 DIAGNOSIS — I421 Obstructive hypertrophic cardiomyopathy: Secondary | ICD-10-CM | POA: Diagnosis not present

## 2022-04-14 DIAGNOSIS — R2689 Other abnormalities of gait and mobility: Secondary | ICD-10-CM | POA: Diagnosis not present

## 2022-04-15 DIAGNOSIS — R1312 Dysphagia, oropharyngeal phase: Secondary | ICD-10-CM | POA: Diagnosis not present

## 2022-04-15 DIAGNOSIS — R278 Other lack of coordination: Secondary | ICD-10-CM | POA: Diagnosis not present

## 2022-04-15 DIAGNOSIS — I5181 Takotsubo syndrome: Secondary | ICD-10-CM | POA: Diagnosis not present

## 2022-04-15 DIAGNOSIS — M6281 Muscle weakness (generalized): Secondary | ICD-10-CM | POA: Diagnosis not present

## 2022-04-15 DIAGNOSIS — R293 Abnormal posture: Secondary | ICD-10-CM | POA: Diagnosis not present

## 2022-04-15 DIAGNOSIS — R2689 Other abnormalities of gait and mobility: Secondary | ICD-10-CM | POA: Diagnosis not present

## 2022-04-15 DIAGNOSIS — I421 Obstructive hypertrophic cardiomyopathy: Secondary | ICD-10-CM | POA: Diagnosis not present

## 2022-04-15 DIAGNOSIS — I639 Cerebral infarction, unspecified: Secondary | ICD-10-CM | POA: Diagnosis not present

## 2022-04-15 DIAGNOSIS — F0393 Unspecified dementia, unspecified severity, with mood disturbance: Secondary | ICD-10-CM | POA: Diagnosis not present

## 2022-04-15 DIAGNOSIS — E785 Hyperlipidemia, unspecified: Secondary | ICD-10-CM | POA: Diagnosis not present

## 2022-04-16 DIAGNOSIS — R293 Abnormal posture: Secondary | ICD-10-CM | POA: Diagnosis not present

## 2022-04-16 DIAGNOSIS — M6281 Muscle weakness (generalized): Secondary | ICD-10-CM | POA: Diagnosis not present

## 2022-04-16 DIAGNOSIS — R278 Other lack of coordination: Secondary | ICD-10-CM | POA: Diagnosis not present

## 2022-04-16 DIAGNOSIS — I421 Obstructive hypertrophic cardiomyopathy: Secondary | ICD-10-CM | POA: Diagnosis not present

## 2022-04-16 DIAGNOSIS — R2689 Other abnormalities of gait and mobility: Secondary | ICD-10-CM | POA: Diagnosis not present

## 2022-04-16 DIAGNOSIS — R1312 Dysphagia, oropharyngeal phase: Secondary | ICD-10-CM | POA: Diagnosis not present

## 2022-04-16 DIAGNOSIS — I639 Cerebral infarction, unspecified: Secondary | ICD-10-CM | POA: Diagnosis not present

## 2022-04-16 DIAGNOSIS — I5181 Takotsubo syndrome: Secondary | ICD-10-CM | POA: Diagnosis not present

## 2022-04-17 DIAGNOSIS — M6281 Muscle weakness (generalized): Secondary | ICD-10-CM | POA: Diagnosis not present

## 2022-04-17 DIAGNOSIS — R2689 Other abnormalities of gait and mobility: Secondary | ICD-10-CM | POA: Diagnosis not present

## 2022-04-17 DIAGNOSIS — I639 Cerebral infarction, unspecified: Secondary | ICD-10-CM | POA: Diagnosis not present

## 2022-04-17 DIAGNOSIS — R278 Other lack of coordination: Secondary | ICD-10-CM | POA: Diagnosis not present

## 2022-04-17 DIAGNOSIS — R1312 Dysphagia, oropharyngeal phase: Secondary | ICD-10-CM | POA: Diagnosis not present

## 2022-04-17 DIAGNOSIS — I421 Obstructive hypertrophic cardiomyopathy: Secondary | ICD-10-CM | POA: Diagnosis not present

## 2022-04-17 DIAGNOSIS — R293 Abnormal posture: Secondary | ICD-10-CM | POA: Diagnosis not present

## 2022-04-17 DIAGNOSIS — I5181 Takotsubo syndrome: Secondary | ICD-10-CM | POA: Diagnosis not present

## 2022-04-18 DIAGNOSIS — I421 Obstructive hypertrophic cardiomyopathy: Secondary | ICD-10-CM | POA: Diagnosis not present

## 2022-04-18 DIAGNOSIS — N189 Chronic kidney disease, unspecified: Secondary | ICD-10-CM | POA: Diagnosis not present

## 2022-04-18 DIAGNOSIS — I129 Hypertensive chronic kidney disease with stage 1 through stage 4 chronic kidney disease, or unspecified chronic kidney disease: Secondary | ICD-10-CM | POA: Diagnosis not present

## 2022-04-18 DIAGNOSIS — M6281 Muscle weakness (generalized): Secondary | ICD-10-CM | POA: Diagnosis not present

## 2022-04-18 DIAGNOSIS — I2109 ST elevation (STEMI) myocardial infarction involving other coronary artery of anterior wall: Secondary | ICD-10-CM | POA: Diagnosis not present

## 2022-04-18 DIAGNOSIS — R2689 Other abnormalities of gait and mobility: Secondary | ICD-10-CM | POA: Diagnosis not present

## 2022-04-18 DIAGNOSIS — I5181 Takotsubo syndrome: Secondary | ICD-10-CM | POA: Diagnosis not present

## 2022-04-18 DIAGNOSIS — R1312 Dysphagia, oropharyngeal phase: Secondary | ICD-10-CM | POA: Diagnosis not present

## 2022-04-18 DIAGNOSIS — R278 Other lack of coordination: Secondary | ICD-10-CM | POA: Diagnosis not present

## 2022-04-18 DIAGNOSIS — R293 Abnormal posture: Secondary | ICD-10-CM | POA: Diagnosis not present

## 2022-04-18 DIAGNOSIS — I639 Cerebral infarction, unspecified: Secondary | ICD-10-CM | POA: Diagnosis not present

## 2022-04-21 DIAGNOSIS — R278 Other lack of coordination: Secondary | ICD-10-CM | POA: Diagnosis not present

## 2022-04-21 DIAGNOSIS — R1312 Dysphagia, oropharyngeal phase: Secondary | ICD-10-CM | POA: Diagnosis not present

## 2022-04-21 DIAGNOSIS — I421 Obstructive hypertrophic cardiomyopathy: Secondary | ICD-10-CM | POA: Diagnosis not present

## 2022-04-21 DIAGNOSIS — I5181 Takotsubo syndrome: Secondary | ICD-10-CM | POA: Diagnosis not present

## 2022-04-21 DIAGNOSIS — R2689 Other abnormalities of gait and mobility: Secondary | ICD-10-CM | POA: Diagnosis not present

## 2022-04-21 DIAGNOSIS — M6281 Muscle weakness (generalized): Secondary | ICD-10-CM | POA: Diagnosis not present

## 2022-04-21 DIAGNOSIS — I639 Cerebral infarction, unspecified: Secondary | ICD-10-CM | POA: Diagnosis not present

## 2022-04-21 DIAGNOSIS — R293 Abnormal posture: Secondary | ICD-10-CM | POA: Diagnosis not present

## 2022-04-22 DIAGNOSIS — R2689 Other abnormalities of gait and mobility: Secondary | ICD-10-CM | POA: Diagnosis not present

## 2022-04-22 DIAGNOSIS — M6281 Muscle weakness (generalized): Secondary | ICD-10-CM | POA: Diagnosis not present

## 2022-04-22 DIAGNOSIS — R278 Other lack of coordination: Secondary | ICD-10-CM | POA: Diagnosis not present

## 2022-04-22 DIAGNOSIS — I639 Cerebral infarction, unspecified: Secondary | ICD-10-CM | POA: Diagnosis not present

## 2022-04-22 DIAGNOSIS — I421 Obstructive hypertrophic cardiomyopathy: Secondary | ICD-10-CM | POA: Diagnosis not present

## 2022-04-22 DIAGNOSIS — R1312 Dysphagia, oropharyngeal phase: Secondary | ICD-10-CM | POA: Diagnosis not present

## 2022-04-22 DIAGNOSIS — R293 Abnormal posture: Secondary | ICD-10-CM | POA: Diagnosis not present

## 2022-04-22 DIAGNOSIS — I5181 Takotsubo syndrome: Secondary | ICD-10-CM | POA: Diagnosis not present

## 2022-04-23 DIAGNOSIS — I5181 Takotsubo syndrome: Secondary | ICD-10-CM | POA: Diagnosis not present

## 2022-04-23 DIAGNOSIS — R293 Abnormal posture: Secondary | ICD-10-CM | POA: Diagnosis not present

## 2022-04-23 DIAGNOSIS — I421 Obstructive hypertrophic cardiomyopathy: Secondary | ICD-10-CM | POA: Diagnosis not present

## 2022-04-23 DIAGNOSIS — M6281 Muscle weakness (generalized): Secondary | ICD-10-CM | POA: Diagnosis not present

## 2022-04-23 DIAGNOSIS — I639 Cerebral infarction, unspecified: Secondary | ICD-10-CM | POA: Diagnosis not present

## 2022-04-23 DIAGNOSIS — R278 Other lack of coordination: Secondary | ICD-10-CM | POA: Diagnosis not present

## 2022-04-23 DIAGNOSIS — R1312 Dysphagia, oropharyngeal phase: Secondary | ICD-10-CM | POA: Diagnosis not present

## 2022-04-23 DIAGNOSIS — R2689 Other abnormalities of gait and mobility: Secondary | ICD-10-CM | POA: Diagnosis not present

## 2022-04-24 DIAGNOSIS — I421 Obstructive hypertrophic cardiomyopathy: Secondary | ICD-10-CM | POA: Diagnosis not present

## 2022-04-24 DIAGNOSIS — I5181 Takotsubo syndrome: Secondary | ICD-10-CM | POA: Diagnosis not present

## 2022-04-24 DIAGNOSIS — R293 Abnormal posture: Secondary | ICD-10-CM | POA: Diagnosis not present

## 2022-04-24 DIAGNOSIS — R278 Other lack of coordination: Secondary | ICD-10-CM | POA: Diagnosis not present

## 2022-04-24 DIAGNOSIS — R2689 Other abnormalities of gait and mobility: Secondary | ICD-10-CM | POA: Diagnosis not present

## 2022-04-24 DIAGNOSIS — I639 Cerebral infarction, unspecified: Secondary | ICD-10-CM | POA: Diagnosis not present

## 2022-04-24 DIAGNOSIS — M6281 Muscle weakness (generalized): Secondary | ICD-10-CM | POA: Diagnosis not present

## 2022-04-24 DIAGNOSIS — R1312 Dysphagia, oropharyngeal phase: Secondary | ICD-10-CM | POA: Diagnosis not present

## 2022-04-25 DIAGNOSIS — R2689 Other abnormalities of gait and mobility: Secondary | ICD-10-CM | POA: Diagnosis not present

## 2022-04-25 DIAGNOSIS — M6281 Muscle weakness (generalized): Secondary | ICD-10-CM | POA: Diagnosis not present

## 2022-04-25 DIAGNOSIS — R1312 Dysphagia, oropharyngeal phase: Secondary | ICD-10-CM | POA: Diagnosis not present

## 2022-04-25 DIAGNOSIS — I639 Cerebral infarction, unspecified: Secondary | ICD-10-CM | POA: Diagnosis not present

## 2022-04-25 DIAGNOSIS — I5181 Takotsubo syndrome: Secondary | ICD-10-CM | POA: Diagnosis not present

## 2022-04-25 DIAGNOSIS — I421 Obstructive hypertrophic cardiomyopathy: Secondary | ICD-10-CM | POA: Diagnosis not present

## 2022-04-25 DIAGNOSIS — R293 Abnormal posture: Secondary | ICD-10-CM | POA: Diagnosis not present

## 2022-04-25 DIAGNOSIS — R278 Other lack of coordination: Secondary | ICD-10-CM | POA: Diagnosis not present

## 2022-04-29 DIAGNOSIS — F0393 Unspecified dementia, unspecified severity, with mood disturbance: Secondary | ICD-10-CM | POA: Diagnosis not present

## 2022-05-04 DIAGNOSIS — M6281 Muscle weakness (generalized): Secondary | ICD-10-CM | POA: Diagnosis not present

## 2022-05-04 DIAGNOSIS — I639 Cerebral infarction, unspecified: Secondary | ICD-10-CM | POA: Diagnosis not present

## 2022-05-04 DIAGNOSIS — R2689 Other abnormalities of gait and mobility: Secondary | ICD-10-CM | POA: Diagnosis not present

## 2022-05-04 DIAGNOSIS — I421 Obstructive hypertrophic cardiomyopathy: Secondary | ICD-10-CM | POA: Diagnosis not present

## 2022-05-07 DIAGNOSIS — I252 Old myocardial infarction: Secondary | ICD-10-CM | POA: Diagnosis not present

## 2022-05-07 DIAGNOSIS — Z7982 Long term (current) use of aspirin: Secondary | ICD-10-CM | POA: Diagnosis not present

## 2022-05-07 DIAGNOSIS — Z7902 Long term (current) use of antithrombotics/antiplatelets: Secondary | ICD-10-CM | POA: Diagnosis not present

## 2022-05-07 DIAGNOSIS — E1122 Type 2 diabetes mellitus with diabetic chronic kidney disease: Secondary | ICD-10-CM | POA: Diagnosis not present

## 2022-05-07 DIAGNOSIS — I129 Hypertensive chronic kidney disease with stage 1 through stage 4 chronic kidney disease, or unspecified chronic kidney disease: Secondary | ICD-10-CM | POA: Diagnosis not present

## 2022-05-07 DIAGNOSIS — N189 Chronic kidney disease, unspecified: Secondary | ICD-10-CM | POA: Diagnosis not present

## 2022-05-07 DIAGNOSIS — K219 Gastro-esophageal reflux disease without esophagitis: Secondary | ICD-10-CM | POA: Diagnosis not present

## 2022-05-07 DIAGNOSIS — D631 Anemia in chronic kidney disease: Secondary | ICD-10-CM | POA: Diagnosis not present

## 2022-05-08 DIAGNOSIS — I639 Cerebral infarction, unspecified: Secondary | ICD-10-CM | POA: Diagnosis not present

## 2022-05-08 DIAGNOSIS — R2689 Other abnormalities of gait and mobility: Secondary | ICD-10-CM | POA: Diagnosis not present

## 2022-05-08 DIAGNOSIS — R1312 Dysphagia, oropharyngeal phase: Secondary | ICD-10-CM | POA: Diagnosis not present

## 2022-05-08 DIAGNOSIS — I5181 Takotsubo syndrome: Secondary | ICD-10-CM | POA: Diagnosis not present

## 2022-05-08 DIAGNOSIS — R278 Other lack of coordination: Secondary | ICD-10-CM | POA: Diagnosis not present

## 2022-05-08 DIAGNOSIS — M6281 Muscle weakness (generalized): Secondary | ICD-10-CM | POA: Diagnosis not present

## 2022-05-08 DIAGNOSIS — I421 Obstructive hypertrophic cardiomyopathy: Secondary | ICD-10-CM | POA: Diagnosis not present

## 2022-05-08 DIAGNOSIS — R293 Abnormal posture: Secondary | ICD-10-CM | POA: Diagnosis not present

## 2022-05-08 DIAGNOSIS — Z23 Encounter for immunization: Secondary | ICD-10-CM | POA: Diagnosis not present

## 2022-05-08 DIAGNOSIS — I25110 Atherosclerotic heart disease of native coronary artery with unstable angina pectoris: Secondary | ICD-10-CM | POA: Diagnosis not present

## 2022-05-13 DIAGNOSIS — F0393 Unspecified dementia, unspecified severity, with mood disturbance: Secondary | ICD-10-CM | POA: Diagnosis not present

## 2022-05-26 DIAGNOSIS — R339 Retention of urine, unspecified: Secondary | ICD-10-CM | POA: Diagnosis not present

## 2022-05-30 DIAGNOSIS — M6281 Muscle weakness (generalized): Secondary | ICD-10-CM | POA: Diagnosis not present

## 2022-05-30 DIAGNOSIS — R278 Other lack of coordination: Secondary | ICD-10-CM | POA: Diagnosis not present

## 2022-05-30 DIAGNOSIS — I421 Obstructive hypertrophic cardiomyopathy: Secondary | ICD-10-CM | POA: Diagnosis not present

## 2022-05-30 DIAGNOSIS — Z23 Encounter for immunization: Secondary | ICD-10-CM | POA: Diagnosis not present

## 2022-05-30 DIAGNOSIS — R2689 Other abnormalities of gait and mobility: Secondary | ICD-10-CM | POA: Diagnosis not present

## 2022-05-30 DIAGNOSIS — I25110 Atherosclerotic heart disease of native coronary artery with unstable angina pectoris: Secondary | ICD-10-CM | POA: Diagnosis not present

## 2022-05-30 DIAGNOSIS — R293 Abnormal posture: Secondary | ICD-10-CM | POA: Diagnosis not present

## 2022-05-30 DIAGNOSIS — I5181 Takotsubo syndrome: Secondary | ICD-10-CM | POA: Diagnosis not present

## 2022-05-30 DIAGNOSIS — I639 Cerebral infarction, unspecified: Secondary | ICD-10-CM | POA: Diagnosis not present

## 2022-05-30 DIAGNOSIS — R1312 Dysphagia, oropharyngeal phase: Secondary | ICD-10-CM | POA: Diagnosis not present

## 2022-06-02 DIAGNOSIS — I639 Cerebral infarction, unspecified: Secondary | ICD-10-CM | POA: Diagnosis not present

## 2022-06-02 DIAGNOSIS — Z23 Encounter for immunization: Secondary | ICD-10-CM | POA: Diagnosis not present

## 2022-06-02 DIAGNOSIS — R293 Abnormal posture: Secondary | ICD-10-CM | POA: Diagnosis not present

## 2022-06-02 DIAGNOSIS — I421 Obstructive hypertrophic cardiomyopathy: Secondary | ICD-10-CM | POA: Diagnosis not present

## 2022-06-02 DIAGNOSIS — I25110 Atherosclerotic heart disease of native coronary artery with unstable angina pectoris: Secondary | ICD-10-CM | POA: Diagnosis not present

## 2022-06-02 DIAGNOSIS — I5181 Takotsubo syndrome: Secondary | ICD-10-CM | POA: Diagnosis not present

## 2022-06-02 DIAGNOSIS — R1312 Dysphagia, oropharyngeal phase: Secondary | ICD-10-CM | POA: Diagnosis not present

## 2022-06-02 DIAGNOSIS — R278 Other lack of coordination: Secondary | ICD-10-CM | POA: Diagnosis not present

## 2022-06-02 DIAGNOSIS — M6281 Muscle weakness (generalized): Secondary | ICD-10-CM | POA: Diagnosis not present

## 2022-06-02 DIAGNOSIS — R2689 Other abnormalities of gait and mobility: Secondary | ICD-10-CM | POA: Diagnosis not present

## 2022-06-03 DIAGNOSIS — R278 Other lack of coordination: Secondary | ICD-10-CM | POA: Diagnosis not present

## 2022-06-03 DIAGNOSIS — I421 Obstructive hypertrophic cardiomyopathy: Secondary | ICD-10-CM | POA: Diagnosis not present

## 2022-06-03 DIAGNOSIS — I639 Cerebral infarction, unspecified: Secondary | ICD-10-CM | POA: Diagnosis not present

## 2022-06-03 DIAGNOSIS — I25110 Atherosclerotic heart disease of native coronary artery with unstable angina pectoris: Secondary | ICD-10-CM | POA: Diagnosis not present

## 2022-06-03 DIAGNOSIS — I5181 Takotsubo syndrome: Secondary | ICD-10-CM | POA: Diagnosis not present

## 2022-06-03 DIAGNOSIS — R1312 Dysphagia, oropharyngeal phase: Secondary | ICD-10-CM | POA: Diagnosis not present

## 2022-06-03 DIAGNOSIS — R293 Abnormal posture: Secondary | ICD-10-CM | POA: Diagnosis not present

## 2022-06-03 DIAGNOSIS — Z23 Encounter for immunization: Secondary | ICD-10-CM | POA: Diagnosis not present

## 2022-06-03 DIAGNOSIS — M6281 Muscle weakness (generalized): Secondary | ICD-10-CM | POA: Diagnosis not present

## 2022-06-03 DIAGNOSIS — R2689 Other abnormalities of gait and mobility: Secondary | ICD-10-CM | POA: Diagnosis not present

## 2022-06-04 DIAGNOSIS — I5181 Takotsubo syndrome: Secondary | ICD-10-CM | POA: Diagnosis not present

## 2022-06-04 DIAGNOSIS — I421 Obstructive hypertrophic cardiomyopathy: Secondary | ICD-10-CM | POA: Diagnosis not present

## 2022-06-04 DIAGNOSIS — R278 Other lack of coordination: Secondary | ICD-10-CM | POA: Diagnosis not present

## 2022-06-04 DIAGNOSIS — R293 Abnormal posture: Secondary | ICD-10-CM | POA: Diagnosis not present

## 2022-06-04 DIAGNOSIS — R1312 Dysphagia, oropharyngeal phase: Secondary | ICD-10-CM | POA: Diagnosis not present

## 2022-06-04 DIAGNOSIS — I639 Cerebral infarction, unspecified: Secondary | ICD-10-CM | POA: Diagnosis not present

## 2022-06-04 DIAGNOSIS — I25110 Atherosclerotic heart disease of native coronary artery with unstable angina pectoris: Secondary | ICD-10-CM | POA: Diagnosis not present

## 2022-06-04 DIAGNOSIS — M6281 Muscle weakness (generalized): Secondary | ICD-10-CM | POA: Diagnosis not present

## 2022-06-04 DIAGNOSIS — R2689 Other abnormalities of gait and mobility: Secondary | ICD-10-CM | POA: Diagnosis not present

## 2022-06-05 DIAGNOSIS — Z960 Presence of urogenital implants: Secondary | ICD-10-CM | POA: Diagnosis not present

## 2022-06-05 DIAGNOSIS — I25110 Atherosclerotic heart disease of native coronary artery with unstable angina pectoris: Secondary | ICD-10-CM | POA: Diagnosis not present

## 2022-06-05 DIAGNOSIS — I083 Combined rheumatic disorders of mitral, aortic and tricuspid valves: Secondary | ICD-10-CM | POA: Diagnosis not present

## 2022-06-05 DIAGNOSIS — M6281 Muscle weakness (generalized): Secondary | ICD-10-CM | POA: Diagnosis not present

## 2022-06-05 DIAGNOSIS — I129 Hypertensive chronic kidney disease with stage 1 through stage 4 chronic kidney disease, or unspecified chronic kidney disease: Secondary | ICD-10-CM | POA: Diagnosis not present

## 2022-06-05 DIAGNOSIS — I441 Atrioventricular block, second degree: Secondary | ICD-10-CM | POA: Diagnosis not present

## 2022-06-05 DIAGNOSIS — Z8616 Personal history of COVID-19: Secondary | ICD-10-CM | POA: Diagnosis not present

## 2022-06-05 DIAGNOSIS — I2510 Atherosclerotic heart disease of native coronary artery without angina pectoris: Secondary | ICD-10-CM | POA: Diagnosis not present

## 2022-06-05 DIAGNOSIS — I252 Old myocardial infarction: Secondary | ICD-10-CM | POA: Diagnosis not present

## 2022-06-05 DIAGNOSIS — R278 Other lack of coordination: Secondary | ICD-10-CM | POA: Diagnosis not present

## 2022-06-05 DIAGNOSIS — Z8673 Personal history of transient ischemic attack (TIA), and cerebral infarction without residual deficits: Secondary | ICD-10-CM | POA: Diagnosis not present

## 2022-06-05 DIAGNOSIS — R2689 Other abnormalities of gait and mobility: Secondary | ICD-10-CM | POA: Diagnosis not present

## 2022-06-05 DIAGNOSIS — R293 Abnormal posture: Secondary | ICD-10-CM | POA: Diagnosis not present

## 2022-06-05 DIAGNOSIS — Z8669 Personal history of other diseases of the nervous system and sense organs: Secondary | ICD-10-CM | POA: Diagnosis not present

## 2022-06-05 DIAGNOSIS — I639 Cerebral infarction, unspecified: Secondary | ICD-10-CM | POA: Diagnosis not present

## 2022-06-05 DIAGNOSIS — I421 Obstructive hypertrophic cardiomyopathy: Secondary | ICD-10-CM | POA: Diagnosis not present

## 2022-06-05 DIAGNOSIS — I5181 Takotsubo syndrome: Secondary | ICD-10-CM | POA: Diagnosis not present

## 2022-06-05 DIAGNOSIS — R1312 Dysphagia, oropharyngeal phase: Secondary | ICD-10-CM | POA: Diagnosis not present

## 2022-06-05 DIAGNOSIS — N1832 Chronic kidney disease, stage 3b: Secondary | ICD-10-CM | POA: Diagnosis not present

## 2022-06-05 DIAGNOSIS — Z7982 Long term (current) use of aspirin: Secondary | ICD-10-CM | POA: Diagnosis not present

## 2022-06-05 DIAGNOSIS — Z7901 Long term (current) use of anticoagulants: Secondary | ICD-10-CM | POA: Diagnosis not present

## 2022-06-06 DIAGNOSIS — R278 Other lack of coordination: Secondary | ICD-10-CM | POA: Diagnosis not present

## 2022-06-06 DIAGNOSIS — R2689 Other abnormalities of gait and mobility: Secondary | ICD-10-CM | POA: Diagnosis not present

## 2022-06-06 DIAGNOSIS — R293 Abnormal posture: Secondary | ICD-10-CM | POA: Diagnosis not present

## 2022-06-06 DIAGNOSIS — I5181 Takotsubo syndrome: Secondary | ICD-10-CM | POA: Diagnosis not present

## 2022-06-06 DIAGNOSIS — I639 Cerebral infarction, unspecified: Secondary | ICD-10-CM | POA: Diagnosis not present

## 2022-06-06 DIAGNOSIS — I421 Obstructive hypertrophic cardiomyopathy: Secondary | ICD-10-CM | POA: Diagnosis not present

## 2022-06-06 DIAGNOSIS — M6281 Muscle weakness (generalized): Secondary | ICD-10-CM | POA: Diagnosis not present

## 2022-06-06 DIAGNOSIS — I25110 Atherosclerotic heart disease of native coronary artery with unstable angina pectoris: Secondary | ICD-10-CM | POA: Diagnosis not present

## 2022-06-06 DIAGNOSIS — R1312 Dysphagia, oropharyngeal phase: Secondary | ICD-10-CM | POA: Diagnosis not present

## 2022-06-09 DIAGNOSIS — R293 Abnormal posture: Secondary | ICD-10-CM | POA: Diagnosis not present

## 2022-06-09 DIAGNOSIS — I5181 Takotsubo syndrome: Secondary | ICD-10-CM | POA: Diagnosis not present

## 2022-06-09 DIAGNOSIS — I421 Obstructive hypertrophic cardiomyopathy: Secondary | ICD-10-CM | POA: Diagnosis not present

## 2022-06-09 DIAGNOSIS — M6281 Muscle weakness (generalized): Secondary | ICD-10-CM | POA: Diagnosis not present

## 2022-06-09 DIAGNOSIS — I25110 Atherosclerotic heart disease of native coronary artery with unstable angina pectoris: Secondary | ICD-10-CM | POA: Diagnosis not present

## 2022-06-09 DIAGNOSIS — R278 Other lack of coordination: Secondary | ICD-10-CM | POA: Diagnosis not present

## 2022-06-09 DIAGNOSIS — R2689 Other abnormalities of gait and mobility: Secondary | ICD-10-CM | POA: Diagnosis not present

## 2022-06-09 DIAGNOSIS — R1312 Dysphagia, oropharyngeal phase: Secondary | ICD-10-CM | POA: Diagnosis not present

## 2022-06-09 DIAGNOSIS — I639 Cerebral infarction, unspecified: Secondary | ICD-10-CM | POA: Diagnosis not present

## 2022-06-10 DIAGNOSIS — M6281 Muscle weakness (generalized): Secondary | ICD-10-CM | POA: Diagnosis not present

## 2022-06-10 DIAGNOSIS — G4700 Insomnia, unspecified: Secondary | ICD-10-CM | POA: Diagnosis not present

## 2022-06-10 DIAGNOSIS — I25110 Atherosclerotic heart disease of native coronary artery with unstable angina pectoris: Secondary | ICD-10-CM | POA: Diagnosis not present

## 2022-06-10 DIAGNOSIS — I421 Obstructive hypertrophic cardiomyopathy: Secondary | ICD-10-CM | POA: Diagnosis not present

## 2022-06-10 DIAGNOSIS — R293 Abnormal posture: Secondary | ICD-10-CM | POA: Diagnosis not present

## 2022-06-10 DIAGNOSIS — R1312 Dysphagia, oropharyngeal phase: Secondary | ICD-10-CM | POA: Diagnosis not present

## 2022-06-10 DIAGNOSIS — F0393 Unspecified dementia, unspecified severity, with mood disturbance: Secondary | ICD-10-CM | POA: Diagnosis not present

## 2022-06-10 DIAGNOSIS — I5181 Takotsubo syndrome: Secondary | ICD-10-CM | POA: Diagnosis not present

## 2022-06-10 DIAGNOSIS — R2689 Other abnormalities of gait and mobility: Secondary | ICD-10-CM | POA: Diagnosis not present

## 2022-06-10 DIAGNOSIS — R278 Other lack of coordination: Secondary | ICD-10-CM | POA: Diagnosis not present

## 2022-06-10 DIAGNOSIS — I639 Cerebral infarction, unspecified: Secondary | ICD-10-CM | POA: Diagnosis not present

## 2022-06-11 DIAGNOSIS — I421 Obstructive hypertrophic cardiomyopathy: Secondary | ICD-10-CM | POA: Diagnosis not present

## 2022-06-11 DIAGNOSIS — R293 Abnormal posture: Secondary | ICD-10-CM | POA: Diagnosis not present

## 2022-06-11 DIAGNOSIS — R278 Other lack of coordination: Secondary | ICD-10-CM | POA: Diagnosis not present

## 2022-06-11 DIAGNOSIS — M6281 Muscle weakness (generalized): Secondary | ICD-10-CM | POA: Diagnosis not present

## 2022-06-11 DIAGNOSIS — I5181 Takotsubo syndrome: Secondary | ICD-10-CM | POA: Diagnosis not present

## 2022-06-11 DIAGNOSIS — R2689 Other abnormalities of gait and mobility: Secondary | ICD-10-CM | POA: Diagnosis not present

## 2022-06-11 DIAGNOSIS — I25110 Atherosclerotic heart disease of native coronary artery with unstable angina pectoris: Secondary | ICD-10-CM | POA: Diagnosis not present

## 2022-06-11 DIAGNOSIS — I639 Cerebral infarction, unspecified: Secondary | ICD-10-CM | POA: Diagnosis not present

## 2022-06-11 DIAGNOSIS — R1312 Dysphagia, oropharyngeal phase: Secondary | ICD-10-CM | POA: Diagnosis not present

## 2022-06-11 NOTE — Progress Notes (Unsigned)
Cardiology Office Note:    Date:  06/12/2022   ID:  Anna Hoffman, DOB 06/15/1935, MRN [id number]  PCP:  Pcp, No  Cardiologist:  Donato Heinz, MD  Electrophysiologist:  None   Referring MD: No ref. provider found   Chief Complaint  Patient presents with   Coronary Artery Disease    History of Present Illness:    Anna Hoffman is a 87 y.o. female with a hx of hypertrophic cardiomyopathy, CVA, T2DM, hypertension, hyperlipidemia who presents for follow-up.  She was hospitalized in August 2022 with syncopal episode.  Echocardiogram 04/01/2021 showed severe basal septal hypertrophy, hyperdynamic LV systolic function, normal RV function, mild AI and mild dilatation of ascending aorta measuring 41 mm.  Cardiac MRI on 04/03/2021 showed asymmetric LV hypertrophy measuring up to 19 mm and basal septum (11 mm and posterior wall) consistent with hypertrophic cardiomyopathy, patchy LGE at RV insertion site (LGE accounts for 2% of total myocardial mass), normal biventricular function.  Zio patch x2 weeks on 05/13/2021 showed 1 run of NSVT lasting 4 beats, Wenckebach.  She was seen by Dr. Lovena Le in EP on 05/10/2021, suspected syncope was due to heart block (had 2-1 AV block during admission in March 2022).  Suspected she would need pacemaker, but patient declined at this time.  Recommended considering loop recorder.  Presented to [facility] 10/14/21 with slurred speech and LE weakness. Found to have acute CVA.  Discharged on ASA + plavix x 21 days then ASA 325 mg daily moving forward. Echo 60-65%.  LDL 45. Cardiac monitor recommended to assess for Afib.  Zio patch x12 days on 01/09/2022 showed 1 episode of NSVT lasting 4 beats, no atrial fibrillation.  She was hospitalized at [facility] 03/2022 and taken for [facility] which showed 30% left main stenosis, diffuse 50% LAD stenosis, 90% in branch of D1, 80% mid LCx (small vessel), 70% RPDA.  Nothing was amenable to intervention and felt to be a poor candidate for PCI  given her age, frailty, and comorbidities.  Echo 03/22/2022 showed EF 65 to 70%.  She was discharged on aspirin, Plavix, metoprolol, and statin.  Since last clinic visit, she reports that she has been doing okay.  Denies any chest pain, dyspnea, lightheadedness, syncope, lower extremity edema, or palpitations.  Taking DAPT, denies any bleeding issues.  Mostly wheelchiar bound but does use walker some.   Past Medical History:  Diagnosis Date   Arthritis    Diabetes mellitus without complication ([place])    Hypertension    Hypokalemia    Pneumonia    Shingles    Stroke [facility])     Past Surgical History:  Procedure Laterality Date   ABDOMINAL HYSTERECTOMY     BREAST EXCISIONAL BIOPSY Left    ESOPHAGOGASTRODUODENOSCOPY N/A 09/01/2014   Procedure: ESOPHAGOGASTRODUODENOSCOPY (EGD);  Surgeon: Lear Ng, MD;  Location: [facility];  Service: Endoscopy;  Laterality: N/A;   ESOPHAGOGASTRODUODENOSCOPY (EGD) WITH PROPOFOL N/A 10/12/2020   Procedure: ESOPHAGOGASTRODUODENOSCOPY (EGD) WITH PROPOFOL;  Surgeon: Mauri Pole, MD;  Location: [facility];  Service: Endoscopy;  Laterality: N/A;   HIATAL HERNIA REPAIR N/A 09/04/2014   Procedure: LAPAROSCOPIC REPAIR OF HIATAL HERNIA;  Surgeon: Excell Seltzer, MD;  Location: [facility];  Service: General;  Laterality: N/A;  With MESH   IR GENERIC HISTORICAL  04/10/2016   IR US GUIDE VASC ACCESS RIGHT 04/10/2016 Corrie Mckusick, DO [facility]   IR GENERIC HISTORICAL  04/10/2016   IR ANGIOGRAM SELECTIVE EACH ADDITIONAL VESSEL 04/10/2016 Corrie Mckusick, DO  [facility]   IR GENERIC HISTORICAL  04/10/2016   IR ANGIOGRAM SELECTIVE EACH ADDITIONAL VESSEL 04/10/2016 Corrie Mckusick, DO [facility]   IR GENERIC HISTORICAL  04/10/2016   IR EMBO TUMOR ORGAN ISCHEMIA INFARCT INC GUIDE ROADMAPPING 04/10/2016 Corrie Mckusick, DO [facility]   IR GENERIC HISTORICAL  04/10/2016   IR ANGIOGRAM SELECTIVE EACH ADDITIONAL VESSEL 04/10/2016 Corrie Mckusick, DO [facility]   IR GENERIC  HISTORICAL  04/10/2016   IR ANGIOGRAM SELECTIVE EACH ADDITIONAL VESSEL 04/10/2016 Corrie Mckusick, DO [facility]   IR GENERIC HISTORICAL  04/10/2016   IR RENAL SELECTIVE  UNI INC S&I MOD SED 04/10/2016 Corrie Mckusick, DO [facility]   IR GENERIC HISTORICAL  03/06/2016   IR RADIOLOGIST EVAL & MGMT 03/06/2016 Corrie Mckusick, DO [facility]   IR GENERIC HISTORICAL  03/26/2016   IR RADIOLOGIST EVAL & MGMT 03/26/2016 Corrie Mckusick, DO [facility]   IR GENERIC HISTORICAL  04/29/2016   IR RADIOLOGIST EVAL & MGMT 04/29/2016 [facility]   IR RADIOLOGIST EVAL & MGMT  11/12/2016   IR RADIOLOGIST EVAL & MGMT  12/16/2017   KNEE SURGERY     TONSILLECTOMY      Current Medications: Current Meds  Medication Sig   acetaminophen (TYLENOL) 325 MG tablet Take 650 mg by mouth every 6 (six) hours as needed for mild pain (Fever >/= 101).   amLODipine (NORVASC) 10 MG tablet Take 1 tablet (10 mg total) by mouth daily.   aspirin 325 MG tablet Take 325 mg by mouth daily.   atorvastatin (LIPITOR) 10 MG tablet Take by mouth.   diclofenac Sodium (VOLTAREN) 1 % GEL Apply 1 application. topically 4 (four) times daily.   Docusate Sodium 100 MG capsule Take by mouth.   fexofenadine (ALLEGRA ALLERGY) 180 MG tablet Take 1 tablet (180 mg total) by mouth daily.   glucose blood test strip Use as directed to check blood sugars 1 time per day.   glucose monitoring kit (FREESTYLE) monitoring kit Use as directed to check blood sugars 1 time per day dx: e11.22   insulin aspart (NOVOLOG) 100 UNIT/ML FlexPen Inject into the skin with meals per sliding scale. 151-200= 2 units, 201-250= 4 units, 251-300= 6 units, 301-350=8 units, 351-400=10 units, >401=10 units and notify MD   Lancets (ONETOUCH DELICA PLUS [id number]) MISC Use to check blood sugar once daily.   meclizine (ANTIVERT) 25 MG tablet Take by mouth.   metoprolol tartrate (LOPRESSOR) 25 MG tablet Take 25 mg by mouth 2 (two) times daily.   pantoprazole (PROTONIX) 40 MG tablet  Take 1 tablet (40 mg total) by mouth 2 (two) times daily.   rosuvastatin (CRESTOR) 20 MG tablet Take 1 tablet (20 mg total) by mouth daily.   saccharomyces boulardii (FLORASTOR) 250 MG capsule Take 1 capsule (250 mg total) by mouth 2 (two) times daily.   sertraline (ZOLOFT) 25 MG tablet Take 25 mg by mouth daily.   sodium bicarbonate 650 MG tablet Take by mouth.   Current Facility-Administered Medications for the 06/12/22 encounter (Office Visit) with Donato Heinz, MD  Medication   cyanocobalamin ((VITAMIN B-12)) injection 1,000 mcg     Allergies:   Amoxicillin, Cefepime, Ampicillin, and Sulfa antibiotics   Social History   Socioeconomic History   Marital status: Widowed    Spouse name: Not on file   Number of children: 2   Years of education: Not on file   Highest education level: Not on file  Occupational History   Occupation:  retired  Tobacco Use   Smoking status: Never   Smokeless tobacco: Never  Vaping Use   Vaping Use: Never used  Substance and Sexual Activity   Alcohol use: No   Drug use: No   Sexual activity: Not Currently    Birth control/protection: Surgical  Other Topics Concern   Not on file  Social History Narrative   Son [name] lives with her   Social Determinants of Health   Financial Resource Strain: Low Risk  (02/14/2021)   Overall Financial Resource Strain (CARDIA)    Difficulty of Paying Living Expenses: Not hard at all  Food Insecurity: No Food Insecurity (02/14/2021)   Hunger Vital Sign    Worried About Running Out of Food in the Last Year: Never true    Ran Out of Food in the Last Year: Never true  Transportation Needs: No Transportation Needs (02/14/2021)   PRAPARE - Hydrologist (Medical): No    Lack of Transportation (Non-Medical): No  Physical Activity: Sufficiently Active (02/14/2021)   Exercise Vital Sign    Days of Exercise per Week: 6 days    Minutes of Exercise per Session: 30 min  Stress: No  Stress Concern Present (02/14/2021)   [place]    Feeling of Stress : Not at all  Social Connections: Not on file     Family History: The patient's family history includes Alzheimer's disease in her mother; Brain cancer in her brother and brother; Pancreatic cancer in her sister; Prostate cancer in her father. There is no history of Allergic rhinitis, Asthma, Eczema, or Urticaria.  ROS:   Please see the history of present illness.     All other systems reviewed and are negative.  EKGs/Labs/Other Studies Reviewed:    The following studies were reviewed today:   EKG:   06/12/22: Normal sinus rhythm, first-degree AV block, rate 69, LVH, inferior Q waves, poor R wave progression 12/05/2021: Normal sinus rhythm, first-degree AV block, LVH, rate 66  Recent Labs: 07/13/2021: TSH 1.098 08/25/2021: ALT 12 08/27/2021: Magnesium 2.2 08/28/2021: BUN 22; Creatinine, Ser 1.57; Hemoglobin 9.4; Platelets 310; Potassium 4.0; Sodium 136  Recent Lipid Panel    Component Value Date/Time   CHOL 139 05/27/2021 1645   CHOL 150 10/18/2020 1549   TRIG 89 05/27/2021 1645   HDL 74 05/27/2021 1645   HDL 74 10/18/2020 1549   CHOLHDL 1.9 05/27/2021 1645   VLDL 18 05/27/2021 1645   LDLCALC 47 05/27/2021 1645   LDLCALC 51 10/18/2020 1549    Physical Exam:    VS:  BP (!) 127/98 (BP Location: Left Arm, Patient Position: Sitting, Cuff Size: Normal)   Pulse 73   Ht 5' 0.5" (1.537 m)   Wt 141 lb (64 kg)   LMP  (LMP Unknown)   SpO2 93%   BMI 27.08 kg/m     Wt Readings from Last 3 Encounters:  06/12/22 141 lb (64 kg)  12/05/21 120 lb (54.4 kg)  10/07/21 120 lb (54.4 kg)     GEN:  Well nourished, well developed in no acute distress HEENT: Normal NECK: No JVD; No carotid bruits LYMPHATICS: No lymphadenopathy CARDIAC: RRR, no murmurs, rubs, gallops RESPIRATORY:  Clear to auscultation without rales, wheezing or rhonchi  ABDOMEN: Soft,  non-tender, non-distended MUSCULOSKELETAL:  L>R edema SKIN: Warm and dry NEUROLOGIC:  Alert and oriented x 3 PSYCHIATRIC:  Normal affect   ASSESSMENT:    1. Coronary artery disease involving  native coronary artery of native heart without angina pectoris   2. Hypertrophic cardiomyopathy ([place])   3. Cerebrovascular accident (CVA), unspecified mechanism ([place])   4. Essential hypertension   5. Syncope, unspecified syncope type   6. Hyperlipidemia, unspecified hyperlipidemia type      PLAN:    Hypertrophic cardiomyopathy: Echocardiogram 04/01/2021 showed severe basal septal hypertrophy, hyperdynamic LV systolic function. Cardiac MRI on 04/03/2021 showed asymmetric LV hypertrophy measuring up to 19 mm and basal septum (11 mm and posterior wall) consistent with hypertrophic cardiomyopathy, patchy LGE at RV insertion site (LGE accounts for 2% of total myocardial mass), normal biventricular function.  Zio patch x2 weeks on 05/13/2021 showed 1 run of NSVT lasting 4 beats, Wenckebach.   Zio patch x12 days on 01/09/2022 showed 1 episode of NSVT lasting 4 beats, no atrial fibrillation. Has had syncopal episodes -She was seen by Dr. Lovena Le in EP on 05/10/2021, suspected syncope was due to heart block (had 2-1 AV block during admission in March 2022).  Suspected she would need pacemaker, but patient declined.  She continues to follow-up with Dr. Lovena Le, she has declined pacemaker for now  -She has been tolerating metoprolol 25 mg twice daily -Recommend her children (2 sons) be screened with echocardiogram, discussed this with her son  CAD: She was hospitalized at [facility] 03/2022 and taken for [facility] which showed 30% left main stenosis, diffuse 50% LAD stenosis, 90% in branch of D1, 80% mid LCx (small vessel), 70% RPDA.  Nothing was amenable to intervention and felt to be a poor candidate for PCI given her age, frailty, and comorbidities.  Echo 03/22/2022 showed EF 65 to 70%.  She was discharged on aspirin, Plavix,  metoprolol, and statin. -Continue aspirin 81 mg daily, Plavix 75 mg daily -Continue atorvastatin -Continue metoprolol 25 mg twice daily  Syncope: Suspect due to heart block, follows with EP as above.  Considering PPM, but patient has declined for now.  Given CVA 10/2021, repeat ZIO monitor was ordered.  Zio patch x12 days on 01/09/2022 showed 1 episode of NSVT lasting 4 beats, no atrial fibrillation.  Recommend follow-up with Dr. Lovena Le for consideration of loop recorder  CVA: Admitted in [facility] with acute CVA 10/2021.  Discharged on aspirin plus Plavix x3 weeks then aspirin 325 mg daily.   Zio patch x12 days on 01/09/2022 showed 1 episode of NSVT lasting 4 beats, no atrial fibrillation.  Recommend follow-up with Dr. Lovena Le for consideration of loop recorder  LLE edema: Given left greater than right swelling, checked lower extremity duplex which showed no evidence of DVT.  Albumin 3.2 on 04/03/2022.  Recent echocardiogram unremarkable.  Suspect secondary to amlodipine use  Hypertension: Previously on metoprolol but discontinued due to syncope is suspected to be due to the AV block.  Subsequently started on Lopressor 25 mg twice daily, has been tolerating.  Also started on amlodipine 10 mg daily.  Hyperlipidemia: On atorvastatin 10 mg daily.  LDL 45 10/2021.  Ascending aortic dilatation: Measured 40 mm on echo 03/2021.  Will monitor.  T2DM: Last A1c 6.2% 02/2021  CKD stage IIIA:  Most recent creatinine 1.5 on 04/03/2022   RTC as needed.  She has establish care with cardiology in [facility] as it is difficult for her to travel to [place]   Medication Adjustments/Labs and Tests Ordered: Current medicines are reviewed at length with the patient today.  Concerns regarding medicines are outlined above.  No orders of the defined types were placed in this encounter.  No orders of the defined types were placed in this encounter.    Patient Instructions  Medication Instructions:  Your  physician recommends that you continue on your current medications as directed. Please refer to the Current Medication list given to you today.  *If you need a refill on your cardiac medications before your next appointment, please call your pharmacy*  Follow-Up: At [facility], you and your health needs are our priority.  As part of our continuing mission to provide you with exceptional heart care, we have created designated Provider Care Teams.  These Care Teams include your primary Cardiologist (physician) and Advanced Practice Providers (APPs -  Physician Assistants and Nurse Practitioners) who all work together to provide you with the care you need, when you need it.  We recommend signing up for the patient portal called "MyChart".  Sign up information is provided on this After Visit Summary.  MyChart is used to connect with patients for Virtual Visits (Telemedicine).  Patients are able to view lab/test results, encounter notes, upcoming appointments, etc.  Non-urgent messages can be sent to your provider as well.   To learn more about what you can do with MyChart, go to [email or web address].    Your next appointment:   AS NEEDED with Dr. Gardiner Rhyme  Schedule follow up appointment with Dr. Melvyn Neth will call to schedule this.     Signed, Donato Heinz, MD  06/12/2022 5:40 PM    [place]

## 2022-06-12 ENCOUNTER — Ambulatory Visit: Payer: Medicare Other | Attending: Cardiology | Admitting: Cardiology

## 2022-06-12 VITALS — BP 127/98 | HR 73 | Ht 60.5 in | Wt 141.0 lb

## 2022-06-12 DIAGNOSIS — I5181 Takotsubo syndrome: Secondary | ICD-10-CM | POA: Diagnosis not present

## 2022-06-12 DIAGNOSIS — I2510 Atherosclerotic heart disease of native coronary artery without angina pectoris: Secondary | ICD-10-CM | POA: Diagnosis not present

## 2022-06-12 DIAGNOSIS — E785 Hyperlipidemia, unspecified: Secondary | ICD-10-CM

## 2022-06-12 DIAGNOSIS — I422 Other hypertrophic cardiomyopathy: Secondary | ICD-10-CM

## 2022-06-12 DIAGNOSIS — R1312 Dysphagia, oropharyngeal phase: Secondary | ICD-10-CM | POA: Diagnosis not present

## 2022-06-12 DIAGNOSIS — M6281 Muscle weakness (generalized): Secondary | ICD-10-CM | POA: Diagnosis not present

## 2022-06-12 DIAGNOSIS — I10 Essential (primary) hypertension: Secondary | ICD-10-CM

## 2022-06-12 DIAGNOSIS — I25110 Atherosclerotic heart disease of native coronary artery with unstable angina pectoris: Secondary | ICD-10-CM | POA: Diagnosis not present

## 2022-06-12 DIAGNOSIS — R2689 Other abnormalities of gait and mobility: Secondary | ICD-10-CM | POA: Diagnosis not present

## 2022-06-12 DIAGNOSIS — R293 Abnormal posture: Secondary | ICD-10-CM | POA: Diagnosis not present

## 2022-06-12 DIAGNOSIS — I421 Obstructive hypertrophic cardiomyopathy: Secondary | ICD-10-CM | POA: Diagnosis not present

## 2022-06-12 DIAGNOSIS — I639 Cerebral infarction, unspecified: Secondary | ICD-10-CM | POA: Diagnosis not present

## 2022-06-12 DIAGNOSIS — R278 Other lack of coordination: Secondary | ICD-10-CM | POA: Diagnosis not present

## 2022-06-12 DIAGNOSIS — R55 Syncope and collapse: Secondary | ICD-10-CM

## 2022-06-12 NOTE — Patient Instructions (Signed)
Medication Instructions:  Your physician recommends that you continue on your current medications as directed. Please refer to the Current Medication list given to you today.  *If you need a refill on your cardiac medications before your next appointment, please call your pharmacy*  Follow-Up: At [facility], you and your health needs are our priority.  As part of our continuing mission to provide you with exceptional heart care, we have created designated Provider Care Teams.  These Care Teams include your primary Cardiologist (physician) and Advanced Practice Providers (APPs -  Physician Assistants and Nurse Practitioners) who all work together to provide you with the care you need, when you need it.  We recommend signing up for the patient portal called "MyChart".  Sign up information is provided on this After Visit Summary.  MyChart is used to connect with patients for Virtual Visits (Telemedicine).  Patients are able to view lab/test results, encounter notes, upcoming appointments, etc.  Non-urgent messages can be sent to your provider as well.   To learn more about what you can do with MyChart, go to [email or web address].    Your next appointment:   AS NEEDED with Dr. Gardiner Rhyme  Schedule follow up appointment with Dr. Melvyn Neth will call to schedule this.

## 2022-06-13 DIAGNOSIS — R278 Other lack of coordination: Secondary | ICD-10-CM | POA: Diagnosis not present

## 2022-06-13 DIAGNOSIS — I639 Cerebral infarction, unspecified: Secondary | ICD-10-CM | POA: Diagnosis not present

## 2022-06-13 DIAGNOSIS — R2689 Other abnormalities of gait and mobility: Secondary | ICD-10-CM | POA: Diagnosis not present

## 2022-06-13 DIAGNOSIS — R293 Abnormal posture: Secondary | ICD-10-CM | POA: Diagnosis not present

## 2022-06-13 DIAGNOSIS — I25110 Atherosclerotic heart disease of native coronary artery with unstable angina pectoris: Secondary | ICD-10-CM | POA: Diagnosis not present

## 2022-06-13 DIAGNOSIS — I5181 Takotsubo syndrome: Secondary | ICD-10-CM | POA: Diagnosis not present

## 2022-06-13 DIAGNOSIS — R1312 Dysphagia, oropharyngeal phase: Secondary | ICD-10-CM | POA: Diagnosis not present

## 2022-06-13 DIAGNOSIS — M6281 Muscle weakness (generalized): Secondary | ICD-10-CM | POA: Diagnosis not present

## 2022-06-13 DIAGNOSIS — I421 Obstructive hypertrophic cardiomyopathy: Secondary | ICD-10-CM | POA: Diagnosis not present

## 2022-06-16 DIAGNOSIS — I639 Cerebral infarction, unspecified: Secondary | ICD-10-CM | POA: Diagnosis not present

## 2022-06-16 DIAGNOSIS — I5181 Takotsubo syndrome: Secondary | ICD-10-CM | POA: Diagnosis not present

## 2022-06-16 DIAGNOSIS — R2689 Other abnormalities of gait and mobility: Secondary | ICD-10-CM | POA: Diagnosis not present

## 2022-06-16 DIAGNOSIS — M6281 Muscle weakness (generalized): Secondary | ICD-10-CM | POA: Diagnosis not present

## 2022-06-16 DIAGNOSIS — R293 Abnormal posture: Secondary | ICD-10-CM | POA: Diagnosis not present

## 2022-06-16 DIAGNOSIS — R1312 Dysphagia, oropharyngeal phase: Secondary | ICD-10-CM | POA: Diagnosis not present

## 2022-06-16 DIAGNOSIS — R278 Other lack of coordination: Secondary | ICD-10-CM | POA: Diagnosis not present

## 2022-06-16 DIAGNOSIS — I421 Obstructive hypertrophic cardiomyopathy: Secondary | ICD-10-CM | POA: Diagnosis not present

## 2022-06-16 DIAGNOSIS — I25110 Atherosclerotic heart disease of native coronary artery with unstable angina pectoris: Secondary | ICD-10-CM | POA: Diagnosis not present

## 2022-06-17 DIAGNOSIS — R1312 Dysphagia, oropharyngeal phase: Secondary | ICD-10-CM | POA: Diagnosis not present

## 2022-06-17 DIAGNOSIS — I25110 Atherosclerotic heart disease of native coronary artery with unstable angina pectoris: Secondary | ICD-10-CM | POA: Diagnosis not present

## 2022-06-17 DIAGNOSIS — I421 Obstructive hypertrophic cardiomyopathy: Secondary | ICD-10-CM | POA: Diagnosis not present

## 2022-06-17 DIAGNOSIS — R278 Other lack of coordination: Secondary | ICD-10-CM | POA: Diagnosis not present

## 2022-06-17 DIAGNOSIS — M6281 Muscle weakness (generalized): Secondary | ICD-10-CM | POA: Diagnosis not present

## 2022-06-17 DIAGNOSIS — I5181 Takotsubo syndrome: Secondary | ICD-10-CM | POA: Diagnosis not present

## 2022-06-17 DIAGNOSIS — R293 Abnormal posture: Secondary | ICD-10-CM | POA: Diagnosis not present

## 2022-06-17 DIAGNOSIS — R2689 Other abnormalities of gait and mobility: Secondary | ICD-10-CM | POA: Diagnosis not present

## 2022-06-17 DIAGNOSIS — I639 Cerebral infarction, unspecified: Secondary | ICD-10-CM | POA: Diagnosis not present

## 2022-06-18 DIAGNOSIS — I25110 Atherosclerotic heart disease of native coronary artery with unstable angina pectoris: Secondary | ICD-10-CM | POA: Diagnosis not present

## 2022-06-18 DIAGNOSIS — R278 Other lack of coordination: Secondary | ICD-10-CM | POA: Diagnosis not present

## 2022-06-18 DIAGNOSIS — R2689 Other abnormalities of gait and mobility: Secondary | ICD-10-CM | POA: Diagnosis not present

## 2022-06-18 DIAGNOSIS — I639 Cerebral infarction, unspecified: Secondary | ICD-10-CM | POA: Diagnosis not present

## 2022-06-18 DIAGNOSIS — R293 Abnormal posture: Secondary | ICD-10-CM | POA: Diagnosis not present

## 2022-06-18 DIAGNOSIS — I421 Obstructive hypertrophic cardiomyopathy: Secondary | ICD-10-CM | POA: Diagnosis not present

## 2022-06-18 DIAGNOSIS — R1312 Dysphagia, oropharyngeal phase: Secondary | ICD-10-CM | POA: Diagnosis not present

## 2022-06-18 DIAGNOSIS — I5181 Takotsubo syndrome: Secondary | ICD-10-CM | POA: Diagnosis not present

## 2022-06-18 DIAGNOSIS — M6281 Muscle weakness (generalized): Secondary | ICD-10-CM | POA: Diagnosis not present

## 2022-06-18 NOTE — Addendum Note (Signed)
Addended by: Deanna Artis A on: 06/18/2022 10:23 AM   Modules accepted: Orders

## 2022-06-19 DIAGNOSIS — I5181 Takotsubo syndrome: Secondary | ICD-10-CM | POA: Diagnosis not present

## 2022-06-19 DIAGNOSIS — R278 Other lack of coordination: Secondary | ICD-10-CM | POA: Diagnosis not present

## 2022-06-19 DIAGNOSIS — I639 Cerebral infarction, unspecified: Secondary | ICD-10-CM | POA: Diagnosis not present

## 2022-06-19 DIAGNOSIS — M6281 Muscle weakness (generalized): Secondary | ICD-10-CM | POA: Diagnosis not present

## 2022-06-19 DIAGNOSIS — R2689 Other abnormalities of gait and mobility: Secondary | ICD-10-CM | POA: Diagnosis not present

## 2022-06-19 DIAGNOSIS — R293 Abnormal posture: Secondary | ICD-10-CM | POA: Diagnosis not present

## 2022-06-19 DIAGNOSIS — R1312 Dysphagia, oropharyngeal phase: Secondary | ICD-10-CM | POA: Diagnosis not present

## 2022-06-19 DIAGNOSIS — I421 Obstructive hypertrophic cardiomyopathy: Secondary | ICD-10-CM | POA: Diagnosis not present

## 2022-06-19 DIAGNOSIS — I25110 Atherosclerotic heart disease of native coronary artery with unstable angina pectoris: Secondary | ICD-10-CM | POA: Diagnosis not present

## 2022-06-20 DIAGNOSIS — R1312 Dysphagia, oropharyngeal phase: Secondary | ICD-10-CM | POA: Diagnosis not present

## 2022-06-20 DIAGNOSIS — I639 Cerebral infarction, unspecified: Secondary | ICD-10-CM | POA: Diagnosis not present

## 2022-06-20 DIAGNOSIS — I129 Hypertensive chronic kidney disease with stage 1 through stage 4 chronic kidney disease, or unspecified chronic kidney disease: Secondary | ICD-10-CM | POA: Diagnosis not present

## 2022-06-20 DIAGNOSIS — I25110 Atherosclerotic heart disease of native coronary artery with unstable angina pectoris: Secondary | ICD-10-CM | POA: Diagnosis not present

## 2022-06-20 DIAGNOSIS — R293 Abnormal posture: Secondary | ICD-10-CM | POA: Diagnosis not present

## 2022-06-20 DIAGNOSIS — Z7902 Long term (current) use of antithrombotics/antiplatelets: Secondary | ICD-10-CM | POA: Diagnosis not present

## 2022-06-20 DIAGNOSIS — N189 Chronic kidney disease, unspecified: Secondary | ICD-10-CM | POA: Diagnosis not present

## 2022-06-20 DIAGNOSIS — I421 Obstructive hypertrophic cardiomyopathy: Secondary | ICD-10-CM | POA: Diagnosis not present

## 2022-06-20 DIAGNOSIS — R2689 Other abnormalities of gait and mobility: Secondary | ICD-10-CM | POA: Diagnosis not present

## 2022-06-20 DIAGNOSIS — M6281 Muscle weakness (generalized): Secondary | ICD-10-CM | POA: Diagnosis not present

## 2022-06-20 DIAGNOSIS — I69354 Hemiplegia and hemiparesis following cerebral infarction affecting left non-dominant side: Secondary | ICD-10-CM | POA: Diagnosis not present

## 2022-06-20 DIAGNOSIS — R278 Other lack of coordination: Secondary | ICD-10-CM | POA: Diagnosis not present

## 2022-06-20 DIAGNOSIS — I5181 Takotsubo syndrome: Secondary | ICD-10-CM | POA: Diagnosis not present

## 2022-06-20 DIAGNOSIS — Z7982 Long term (current) use of aspirin: Secondary | ICD-10-CM | POA: Diagnosis not present

## 2022-06-22 DIAGNOSIS — I639 Cerebral infarction, unspecified: Secondary | ICD-10-CM | POA: Diagnosis not present

## 2022-06-22 DIAGNOSIS — R278 Other lack of coordination: Secondary | ICD-10-CM | POA: Diagnosis not present

## 2022-06-22 DIAGNOSIS — R2689 Other abnormalities of gait and mobility: Secondary | ICD-10-CM | POA: Diagnosis not present

## 2022-06-22 DIAGNOSIS — I421 Obstructive hypertrophic cardiomyopathy: Secondary | ICD-10-CM | POA: Diagnosis not present

## 2022-06-22 DIAGNOSIS — R293 Abnormal posture: Secondary | ICD-10-CM | POA: Diagnosis not present

## 2022-06-22 DIAGNOSIS — I25110 Atherosclerotic heart disease of native coronary artery with unstable angina pectoris: Secondary | ICD-10-CM | POA: Diagnosis not present

## 2022-06-22 DIAGNOSIS — R1312 Dysphagia, oropharyngeal phase: Secondary | ICD-10-CM | POA: Diagnosis not present

## 2022-06-22 DIAGNOSIS — I5181 Takotsubo syndrome: Secondary | ICD-10-CM | POA: Diagnosis not present

## 2022-06-22 DIAGNOSIS — M6281 Muscle weakness (generalized): Secondary | ICD-10-CM | POA: Diagnosis not present

## 2022-06-23 DIAGNOSIS — R293 Abnormal posture: Secondary | ICD-10-CM | POA: Diagnosis not present

## 2022-06-23 DIAGNOSIS — I25110 Atherosclerotic heart disease of native coronary artery with unstable angina pectoris: Secondary | ICD-10-CM | POA: Diagnosis not present

## 2022-06-23 DIAGNOSIS — R2689 Other abnormalities of gait and mobility: Secondary | ICD-10-CM | POA: Diagnosis not present

## 2022-06-23 DIAGNOSIS — I5181 Takotsubo syndrome: Secondary | ICD-10-CM | POA: Diagnosis not present

## 2022-06-23 DIAGNOSIS — I421 Obstructive hypertrophic cardiomyopathy: Secondary | ICD-10-CM | POA: Diagnosis not present

## 2022-06-23 DIAGNOSIS — M6281 Muscle weakness (generalized): Secondary | ICD-10-CM | POA: Diagnosis not present

## 2022-06-23 DIAGNOSIS — R278 Other lack of coordination: Secondary | ICD-10-CM | POA: Diagnosis not present

## 2022-06-23 DIAGNOSIS — R1312 Dysphagia, oropharyngeal phase: Secondary | ICD-10-CM | POA: Diagnosis not present

## 2022-06-23 DIAGNOSIS — I639 Cerebral infarction, unspecified: Secondary | ICD-10-CM | POA: Diagnosis not present

## 2022-06-25 DIAGNOSIS — I421 Obstructive hypertrophic cardiomyopathy: Secondary | ICD-10-CM | POA: Diagnosis not present

## 2022-06-25 DIAGNOSIS — M6281 Muscle weakness (generalized): Secondary | ICD-10-CM | POA: Diagnosis not present

## 2022-06-25 DIAGNOSIS — I25110 Atherosclerotic heart disease of native coronary artery with unstable angina pectoris: Secondary | ICD-10-CM | POA: Diagnosis not present

## 2022-06-25 DIAGNOSIS — I5181 Takotsubo syndrome: Secondary | ICD-10-CM | POA: Diagnosis not present

## 2022-06-25 DIAGNOSIS — R2689 Other abnormalities of gait and mobility: Secondary | ICD-10-CM | POA: Diagnosis not present

## 2022-06-25 DIAGNOSIS — R293 Abnormal posture: Secondary | ICD-10-CM | POA: Diagnosis not present

## 2022-06-25 DIAGNOSIS — R278 Other lack of coordination: Secondary | ICD-10-CM | POA: Diagnosis not present

## 2022-06-25 DIAGNOSIS — I639 Cerebral infarction, unspecified: Secondary | ICD-10-CM | POA: Diagnosis not present

## 2022-06-25 DIAGNOSIS — R1312 Dysphagia, oropharyngeal phase: Secondary | ICD-10-CM | POA: Diagnosis not present

## 2022-07-01 DIAGNOSIS — R339 Retention of urine, unspecified: Secondary | ICD-10-CM | POA: Diagnosis not present

## 2022-07-04 DIAGNOSIS — R2689 Other abnormalities of gait and mobility: Secondary | ICD-10-CM | POA: Diagnosis not present

## 2022-07-04 DIAGNOSIS — M6281 Muscle weakness (generalized): Secondary | ICD-10-CM | POA: Diagnosis not present

## 2022-07-04 DIAGNOSIS — I639 Cerebral infarction, unspecified: Secondary | ICD-10-CM | POA: Diagnosis not present

## 2022-07-04 DIAGNOSIS — I421 Obstructive hypertrophic cardiomyopathy: Secondary | ICD-10-CM | POA: Diagnosis not present

## 2022-07-07 DIAGNOSIS — Z7982 Long term (current) use of aspirin: Secondary | ICD-10-CM | POA: Diagnosis not present

## 2022-07-07 DIAGNOSIS — Z87448 Personal history of other diseases of urinary system: Secondary | ICD-10-CM | POA: Diagnosis not present

## 2022-07-07 DIAGNOSIS — N189 Chronic kidney disease, unspecified: Secondary | ICD-10-CM | POA: Diagnosis not present

## 2022-07-07 DIAGNOSIS — E1122 Type 2 diabetes mellitus with diabetic chronic kidney disease: Secondary | ICD-10-CM | POA: Diagnosis not present

## 2022-07-07 DIAGNOSIS — I252 Old myocardial infarction: Secondary | ICD-10-CM | POA: Diagnosis not present

## 2022-07-07 DIAGNOSIS — I69354 Hemiplegia and hemiparesis following cerebral infarction affecting left non-dominant side: Secondary | ICD-10-CM | POA: Diagnosis not present

## 2022-07-07 DIAGNOSIS — I129 Hypertensive chronic kidney disease with stage 1 through stage 4 chronic kidney disease, or unspecified chronic kidney disease: Secondary | ICD-10-CM | POA: Diagnosis not present

## 2022-07-07 DIAGNOSIS — Z7902 Long term (current) use of antithrombotics/antiplatelets: Secondary | ICD-10-CM | POA: Diagnosis not present

## 2022-07-07 DIAGNOSIS — K219 Gastro-esophageal reflux disease without esophagitis: Secondary | ICD-10-CM | POA: Diagnosis not present

## 2022-07-07 DIAGNOSIS — Z09 Encounter for follow-up examination after completed treatment for conditions other than malignant neoplasm: Secondary | ICD-10-CM | POA: Diagnosis not present

## 2022-07-08 DIAGNOSIS — R339 Retention of urine, unspecified: Secondary | ICD-10-CM | POA: Diagnosis not present

## 2022-07-08 DIAGNOSIS — E1122 Type 2 diabetes mellitus with diabetic chronic kidney disease: Secondary | ICD-10-CM | POA: Diagnosis not present

## 2022-07-21 DIAGNOSIS — I69354 Hemiplegia and hemiparesis following cerebral infarction affecting left non-dominant side: Secondary | ICD-10-CM | POA: Diagnosis not present

## 2022-07-21 DIAGNOSIS — K219 Gastro-esophageal reflux disease without esophagitis: Secondary | ICD-10-CM | POA: Diagnosis not present

## 2022-07-21 DIAGNOSIS — Z7902 Long term (current) use of antithrombotics/antiplatelets: Secondary | ICD-10-CM | POA: Diagnosis not present

## 2022-07-21 DIAGNOSIS — Z7982 Long term (current) use of aspirin: Secondary | ICD-10-CM | POA: Diagnosis not present

## 2022-07-29 DIAGNOSIS — I10 Essential (primary) hypertension: Secondary | ICD-10-CM | POA: Diagnosis not present

## 2022-08-05 DIAGNOSIS — F0153 Vascular dementia, unspecified severity, with mood disturbance: Secondary | ICD-10-CM | POA: Diagnosis not present

## 2022-08-29 DIAGNOSIS — E1169 Type 2 diabetes mellitus with other specified complication: Secondary | ICD-10-CM | POA: Diagnosis not present

## 2022-08-29 DIAGNOSIS — N184 Chronic kidney disease, stage 4 (severe): Secondary | ICD-10-CM | POA: Diagnosis not present

## 2022-08-29 DIAGNOSIS — I10 Essential (primary) hypertension: Secondary | ICD-10-CM | POA: Diagnosis not present

## 2022-08-29 DIAGNOSIS — E559 Vitamin D deficiency, unspecified: Secondary | ICD-10-CM | POA: Diagnosis not present

## 2022-08-29 DIAGNOSIS — N1831 Chronic kidney disease, stage 3a: Secondary | ICD-10-CM | POA: Diagnosis not present

## 2022-08-29 DIAGNOSIS — N390 Urinary tract infection, site not specified: Secondary | ICD-10-CM | POA: Diagnosis not present

## 2022-08-29 DIAGNOSIS — E872 Acidosis, unspecified: Secondary | ICD-10-CM | POA: Diagnosis not present

## 2022-08-29 DIAGNOSIS — D649 Anemia, unspecified: Secondary | ICD-10-CM | POA: Diagnosis not present

## 2022-08-29 DIAGNOSIS — N281 Cyst of kidney, acquired: Secondary | ICD-10-CM | POA: Diagnosis not present

## 2022-08-29 DIAGNOSIS — E211 Secondary hyperparathyroidism, not elsewhere classified: Secondary | ICD-10-CM | POA: Diagnosis not present

## 2022-08-29 DIAGNOSIS — I421 Obstructive hypertrophic cardiomyopathy: Secondary | ICD-10-CM | POA: Diagnosis not present

## 2022-09-01 DIAGNOSIS — Z7902 Long term (current) use of antithrombotics/antiplatelets: Secondary | ICD-10-CM | POA: Diagnosis not present

## 2022-09-01 DIAGNOSIS — I129 Hypertensive chronic kidney disease with stage 1 through stage 4 chronic kidney disease, or unspecified chronic kidney disease: Secondary | ICD-10-CM | POA: Diagnosis not present

## 2022-09-01 DIAGNOSIS — Z7982 Long term (current) use of aspirin: Secondary | ICD-10-CM | POA: Diagnosis not present

## 2022-09-01 DIAGNOSIS — K5900 Constipation, unspecified: Secondary | ICD-10-CM | POA: Diagnosis not present

## 2022-09-01 DIAGNOSIS — I69354 Hemiplegia and hemiparesis following cerebral infarction affecting left non-dominant side: Secondary | ICD-10-CM | POA: Diagnosis not present

## 2022-09-01 DIAGNOSIS — N189 Chronic kidney disease, unspecified: Secondary | ICD-10-CM | POA: Diagnosis not present

## 2022-09-02 DIAGNOSIS — I5181 Takotsubo syndrome: Secondary | ICD-10-CM | POA: Diagnosis not present

## 2022-09-02 DIAGNOSIS — I421 Obstructive hypertrophic cardiomyopathy: Secondary | ICD-10-CM | POA: Diagnosis not present

## 2022-09-02 DIAGNOSIS — I639 Cerebral infarction, unspecified: Secondary | ICD-10-CM | POA: Diagnosis not present

## 2022-09-02 DIAGNOSIS — R293 Abnormal posture: Secondary | ICD-10-CM | POA: Diagnosis not present

## 2022-09-02 DIAGNOSIS — R1312 Dysphagia, oropharyngeal phase: Secondary | ICD-10-CM | POA: Diagnosis not present

## 2022-09-02 DIAGNOSIS — R278 Other lack of coordination: Secondary | ICD-10-CM | POA: Diagnosis not present

## 2022-09-02 DIAGNOSIS — I25110 Atherosclerotic heart disease of native coronary artery with unstable angina pectoris: Secondary | ICD-10-CM | POA: Diagnosis not present

## 2022-09-02 DIAGNOSIS — M6281 Muscle weakness (generalized): Secondary | ICD-10-CM | POA: Diagnosis not present

## 2022-09-02 DIAGNOSIS — R2689 Other abnormalities of gait and mobility: Secondary | ICD-10-CM | POA: Diagnosis not present

## 2022-09-03 DIAGNOSIS — R278 Other lack of coordination: Secondary | ICD-10-CM | POA: Diagnosis not present

## 2022-09-03 DIAGNOSIS — I421 Obstructive hypertrophic cardiomyopathy: Secondary | ICD-10-CM | POA: Diagnosis not present

## 2022-09-03 DIAGNOSIS — I639 Cerebral infarction, unspecified: Secondary | ICD-10-CM | POA: Diagnosis not present

## 2022-09-03 DIAGNOSIS — R293 Abnormal posture: Secondary | ICD-10-CM | POA: Diagnosis not present

## 2022-09-03 DIAGNOSIS — R1312 Dysphagia, oropharyngeal phase: Secondary | ICD-10-CM | POA: Diagnosis not present

## 2022-09-03 DIAGNOSIS — I5181 Takotsubo syndrome: Secondary | ICD-10-CM | POA: Diagnosis not present

## 2022-09-03 DIAGNOSIS — I25110 Atherosclerotic heart disease of native coronary artery with unstable angina pectoris: Secondary | ICD-10-CM | POA: Diagnosis not present

## 2022-09-03 DIAGNOSIS — M6281 Muscle weakness (generalized): Secondary | ICD-10-CM | POA: Diagnosis not present

## 2022-09-03 DIAGNOSIS — R2689 Other abnormalities of gait and mobility: Secondary | ICD-10-CM | POA: Diagnosis not present

## 2022-09-04 DIAGNOSIS — R1312 Dysphagia, oropharyngeal phase: Secondary | ICD-10-CM | POA: Diagnosis not present

## 2022-09-04 DIAGNOSIS — I639 Cerebral infarction, unspecified: Secondary | ICD-10-CM | POA: Diagnosis not present

## 2022-09-04 DIAGNOSIS — I25110 Atherosclerotic heart disease of native coronary artery with unstable angina pectoris: Secondary | ICD-10-CM | POA: Diagnosis not present

## 2022-09-04 DIAGNOSIS — M6281 Muscle weakness (generalized): Secondary | ICD-10-CM | POA: Diagnosis not present

## 2022-09-04 DIAGNOSIS — R2689 Other abnormalities of gait and mobility: Secondary | ICD-10-CM | POA: Diagnosis not present

## 2022-09-04 DIAGNOSIS — R293 Abnormal posture: Secondary | ICD-10-CM | POA: Diagnosis not present

## 2022-09-04 DIAGNOSIS — I5181 Takotsubo syndrome: Secondary | ICD-10-CM | POA: Diagnosis not present

## 2022-09-04 DIAGNOSIS — R278 Other lack of coordination: Secondary | ICD-10-CM | POA: Diagnosis not present

## 2022-09-04 DIAGNOSIS — I421 Obstructive hypertrophic cardiomyopathy: Secondary | ICD-10-CM | POA: Diagnosis not present

## 2022-09-05 DIAGNOSIS — R2689 Other abnormalities of gait and mobility: Secondary | ICD-10-CM | POA: Diagnosis not present

## 2022-09-05 DIAGNOSIS — R293 Abnormal posture: Secondary | ICD-10-CM | POA: Diagnosis not present

## 2022-09-05 DIAGNOSIS — R278 Other lack of coordination: Secondary | ICD-10-CM | POA: Diagnosis not present

## 2022-09-05 DIAGNOSIS — I421 Obstructive hypertrophic cardiomyopathy: Secondary | ICD-10-CM | POA: Diagnosis not present

## 2022-09-05 DIAGNOSIS — I639 Cerebral infarction, unspecified: Secondary | ICD-10-CM | POA: Diagnosis not present

## 2022-09-05 DIAGNOSIS — R1312 Dysphagia, oropharyngeal phase: Secondary | ICD-10-CM | POA: Diagnosis not present

## 2022-09-05 DIAGNOSIS — M6281 Muscle weakness (generalized): Secondary | ICD-10-CM | POA: Diagnosis not present

## 2022-09-05 DIAGNOSIS — I25110 Atherosclerotic heart disease of native coronary artery with unstable angina pectoris: Secondary | ICD-10-CM | POA: Diagnosis not present

## 2022-09-05 DIAGNOSIS — I5181 Takotsubo syndrome: Secondary | ICD-10-CM | POA: Diagnosis not present

## 2022-09-08 DIAGNOSIS — R293 Abnormal posture: Secondary | ICD-10-CM | POA: Diagnosis not present

## 2022-09-08 DIAGNOSIS — I25110 Atherosclerotic heart disease of native coronary artery with unstable angina pectoris: Secondary | ICD-10-CM | POA: Diagnosis not present

## 2022-09-08 DIAGNOSIS — R278 Other lack of coordination: Secondary | ICD-10-CM | POA: Diagnosis not present

## 2022-09-08 DIAGNOSIS — I639 Cerebral infarction, unspecified: Secondary | ICD-10-CM | POA: Diagnosis not present

## 2022-09-08 DIAGNOSIS — M6281 Muscle weakness (generalized): Secondary | ICD-10-CM | POA: Diagnosis not present

## 2022-09-08 DIAGNOSIS — R2689 Other abnormalities of gait and mobility: Secondary | ICD-10-CM | POA: Diagnosis not present

## 2022-09-08 DIAGNOSIS — I421 Obstructive hypertrophic cardiomyopathy: Secondary | ICD-10-CM | POA: Diagnosis not present

## 2022-09-08 DIAGNOSIS — I5181 Takotsubo syndrome: Secondary | ICD-10-CM | POA: Diagnosis not present

## 2022-09-08 DIAGNOSIS — R1312 Dysphagia, oropharyngeal phase: Secondary | ICD-10-CM | POA: Diagnosis not present

## 2022-09-09 DIAGNOSIS — I25110 Atherosclerotic heart disease of native coronary artery with unstable angina pectoris: Secondary | ICD-10-CM | POA: Diagnosis not present

## 2022-09-09 DIAGNOSIS — I421 Obstructive hypertrophic cardiomyopathy: Secondary | ICD-10-CM | POA: Diagnosis not present

## 2022-09-09 DIAGNOSIS — I5181 Takotsubo syndrome: Secondary | ICD-10-CM | POA: Diagnosis not present

## 2022-09-09 DIAGNOSIS — R2689 Other abnormalities of gait and mobility: Secondary | ICD-10-CM | POA: Diagnosis not present

## 2022-09-09 DIAGNOSIS — I639 Cerebral infarction, unspecified: Secondary | ICD-10-CM | POA: Diagnosis not present

## 2022-09-09 DIAGNOSIS — R1312 Dysphagia, oropharyngeal phase: Secondary | ICD-10-CM | POA: Diagnosis not present

## 2022-09-09 DIAGNOSIS — R278 Other lack of coordination: Secondary | ICD-10-CM | POA: Diagnosis not present

## 2022-09-09 DIAGNOSIS — R293 Abnormal posture: Secondary | ICD-10-CM | POA: Diagnosis not present

## 2022-09-09 DIAGNOSIS — M6281 Muscle weakness (generalized): Secondary | ICD-10-CM | POA: Diagnosis not present

## 2022-09-10 DIAGNOSIS — R1312 Dysphagia, oropharyngeal phase: Secondary | ICD-10-CM | POA: Diagnosis not present

## 2022-09-10 DIAGNOSIS — R278 Other lack of coordination: Secondary | ICD-10-CM | POA: Diagnosis not present

## 2022-09-10 DIAGNOSIS — R2689 Other abnormalities of gait and mobility: Secondary | ICD-10-CM | POA: Diagnosis not present

## 2022-09-10 DIAGNOSIS — M6281 Muscle weakness (generalized): Secondary | ICD-10-CM | POA: Diagnosis not present

## 2022-09-10 DIAGNOSIS — R293 Abnormal posture: Secondary | ICD-10-CM | POA: Diagnosis not present

## 2022-09-10 DIAGNOSIS — I421 Obstructive hypertrophic cardiomyopathy: Secondary | ICD-10-CM | POA: Diagnosis not present

## 2022-09-10 DIAGNOSIS — I639 Cerebral infarction, unspecified: Secondary | ICD-10-CM | POA: Diagnosis not present

## 2022-09-10 DIAGNOSIS — I25110 Atherosclerotic heart disease of native coronary artery with unstable angina pectoris: Secondary | ICD-10-CM | POA: Diagnosis not present

## 2022-09-10 DIAGNOSIS — I5181 Takotsubo syndrome: Secondary | ICD-10-CM | POA: Diagnosis not present

## 2022-09-11 DIAGNOSIS — R293 Abnormal posture: Secondary | ICD-10-CM | POA: Diagnosis not present

## 2022-09-11 DIAGNOSIS — I5181 Takotsubo syndrome: Secondary | ICD-10-CM | POA: Diagnosis not present

## 2022-09-11 DIAGNOSIS — R1312 Dysphagia, oropharyngeal phase: Secondary | ICD-10-CM | POA: Diagnosis not present

## 2022-09-11 DIAGNOSIS — M6281 Muscle weakness (generalized): Secondary | ICD-10-CM | POA: Diagnosis not present

## 2022-09-11 DIAGNOSIS — I639 Cerebral infarction, unspecified: Secondary | ICD-10-CM | POA: Diagnosis not present

## 2022-09-11 DIAGNOSIS — I25110 Atherosclerotic heart disease of native coronary artery with unstable angina pectoris: Secondary | ICD-10-CM | POA: Diagnosis not present

## 2022-09-11 DIAGNOSIS — R2689 Other abnormalities of gait and mobility: Secondary | ICD-10-CM | POA: Diagnosis not present

## 2022-09-11 DIAGNOSIS — R278 Other lack of coordination: Secondary | ICD-10-CM | POA: Diagnosis not present

## 2022-09-11 DIAGNOSIS — I421 Obstructive hypertrophic cardiomyopathy: Secondary | ICD-10-CM | POA: Diagnosis not present

## 2022-09-12 DIAGNOSIS — R293 Abnormal posture: Secondary | ICD-10-CM | POA: Diagnosis not present

## 2022-09-12 DIAGNOSIS — I5181 Takotsubo syndrome: Secondary | ICD-10-CM | POA: Diagnosis not present

## 2022-09-12 DIAGNOSIS — M6281 Muscle weakness (generalized): Secondary | ICD-10-CM | POA: Diagnosis not present

## 2022-09-12 DIAGNOSIS — R1312 Dysphagia, oropharyngeal phase: Secondary | ICD-10-CM | POA: Diagnosis not present

## 2022-09-12 DIAGNOSIS — R278 Other lack of coordination: Secondary | ICD-10-CM | POA: Diagnosis not present

## 2022-09-12 DIAGNOSIS — I421 Obstructive hypertrophic cardiomyopathy: Secondary | ICD-10-CM | POA: Diagnosis not present

## 2022-09-12 DIAGNOSIS — I25110 Atherosclerotic heart disease of native coronary artery with unstable angina pectoris: Secondary | ICD-10-CM | POA: Diagnosis not present

## 2022-09-12 DIAGNOSIS — I639 Cerebral infarction, unspecified: Secondary | ICD-10-CM | POA: Diagnosis not present

## 2022-09-12 DIAGNOSIS — R2689 Other abnormalities of gait and mobility: Secondary | ICD-10-CM | POA: Diagnosis not present

## 2022-09-15 DIAGNOSIS — R1312 Dysphagia, oropharyngeal phase: Secondary | ICD-10-CM | POA: Diagnosis not present

## 2022-09-15 DIAGNOSIS — R293 Abnormal posture: Secondary | ICD-10-CM | POA: Diagnosis not present

## 2022-09-15 DIAGNOSIS — I5181 Takotsubo syndrome: Secondary | ICD-10-CM | POA: Diagnosis not present

## 2022-09-15 DIAGNOSIS — R278 Other lack of coordination: Secondary | ICD-10-CM | POA: Diagnosis not present

## 2022-09-15 DIAGNOSIS — I421 Obstructive hypertrophic cardiomyopathy: Secondary | ICD-10-CM | POA: Diagnosis not present

## 2022-09-15 DIAGNOSIS — I639 Cerebral infarction, unspecified: Secondary | ICD-10-CM | POA: Diagnosis not present

## 2022-09-15 DIAGNOSIS — R2689 Other abnormalities of gait and mobility: Secondary | ICD-10-CM | POA: Diagnosis not present

## 2022-09-15 DIAGNOSIS — M6281 Muscle weakness (generalized): Secondary | ICD-10-CM | POA: Diagnosis not present

## 2022-09-15 DIAGNOSIS — I25110 Atherosclerotic heart disease of native coronary artery with unstable angina pectoris: Secondary | ICD-10-CM | POA: Diagnosis not present

## 2022-09-16 DIAGNOSIS — I25110 Atherosclerotic heart disease of native coronary artery with unstable angina pectoris: Secondary | ICD-10-CM | POA: Diagnosis not present

## 2022-09-16 DIAGNOSIS — I421 Obstructive hypertrophic cardiomyopathy: Secondary | ICD-10-CM | POA: Diagnosis not present

## 2022-09-16 DIAGNOSIS — R1312 Dysphagia, oropharyngeal phase: Secondary | ICD-10-CM | POA: Diagnosis not present

## 2022-09-16 DIAGNOSIS — R293 Abnormal posture: Secondary | ICD-10-CM | POA: Diagnosis not present

## 2022-09-16 DIAGNOSIS — R278 Other lack of coordination: Secondary | ICD-10-CM | POA: Diagnosis not present

## 2022-09-16 DIAGNOSIS — I5181 Takotsubo syndrome: Secondary | ICD-10-CM | POA: Diagnosis not present

## 2022-09-16 DIAGNOSIS — M6281 Muscle weakness (generalized): Secondary | ICD-10-CM | POA: Diagnosis not present

## 2022-09-16 DIAGNOSIS — I639 Cerebral infarction, unspecified: Secondary | ICD-10-CM | POA: Diagnosis not present

## 2022-09-16 DIAGNOSIS — R2689 Other abnormalities of gait and mobility: Secondary | ICD-10-CM | POA: Diagnosis not present

## 2022-09-17 DIAGNOSIS — M6281 Muscle weakness (generalized): Secondary | ICD-10-CM | POA: Diagnosis not present

## 2022-09-17 DIAGNOSIS — I5181 Takotsubo syndrome: Secondary | ICD-10-CM | POA: Diagnosis not present

## 2022-09-17 DIAGNOSIS — R1312 Dysphagia, oropharyngeal phase: Secondary | ICD-10-CM | POA: Diagnosis not present

## 2022-09-17 DIAGNOSIS — I25110 Atherosclerotic heart disease of native coronary artery with unstable angina pectoris: Secondary | ICD-10-CM | POA: Diagnosis not present

## 2022-09-17 DIAGNOSIS — R278 Other lack of coordination: Secondary | ICD-10-CM | POA: Diagnosis not present

## 2022-09-17 DIAGNOSIS — R2689 Other abnormalities of gait and mobility: Secondary | ICD-10-CM | POA: Diagnosis not present

## 2022-09-17 DIAGNOSIS — I639 Cerebral infarction, unspecified: Secondary | ICD-10-CM | POA: Diagnosis not present

## 2022-09-17 DIAGNOSIS — R293 Abnormal posture: Secondary | ICD-10-CM | POA: Diagnosis not present

## 2022-09-17 DIAGNOSIS — I421 Obstructive hypertrophic cardiomyopathy: Secondary | ICD-10-CM | POA: Diagnosis not present

## 2022-09-18 DIAGNOSIS — M6281 Muscle weakness (generalized): Secondary | ICD-10-CM | POA: Diagnosis not present

## 2022-09-18 DIAGNOSIS — I5181 Takotsubo syndrome: Secondary | ICD-10-CM | POA: Diagnosis not present

## 2022-09-18 DIAGNOSIS — I421 Obstructive hypertrophic cardiomyopathy: Secondary | ICD-10-CM | POA: Diagnosis not present

## 2022-09-18 DIAGNOSIS — R293 Abnormal posture: Secondary | ICD-10-CM | POA: Diagnosis not present

## 2022-09-18 DIAGNOSIS — I639 Cerebral infarction, unspecified: Secondary | ICD-10-CM | POA: Diagnosis not present

## 2022-09-18 DIAGNOSIS — R278 Other lack of coordination: Secondary | ICD-10-CM | POA: Diagnosis not present

## 2022-09-18 DIAGNOSIS — R2689 Other abnormalities of gait and mobility: Secondary | ICD-10-CM | POA: Diagnosis not present

## 2022-09-18 DIAGNOSIS — I25110 Atherosclerotic heart disease of native coronary artery with unstable angina pectoris: Secondary | ICD-10-CM | POA: Diagnosis not present

## 2022-09-18 DIAGNOSIS — R1312 Dysphagia, oropharyngeal phase: Secondary | ICD-10-CM | POA: Diagnosis not present

## 2022-09-19 DIAGNOSIS — I639 Cerebral infarction, unspecified: Secondary | ICD-10-CM | POA: Diagnosis not present

## 2022-09-19 DIAGNOSIS — R278 Other lack of coordination: Secondary | ICD-10-CM | POA: Diagnosis not present

## 2022-09-19 DIAGNOSIS — M6281 Muscle weakness (generalized): Secondary | ICD-10-CM | POA: Diagnosis not present

## 2022-09-19 DIAGNOSIS — I421 Obstructive hypertrophic cardiomyopathy: Secondary | ICD-10-CM | POA: Diagnosis not present

## 2022-09-19 DIAGNOSIS — R293 Abnormal posture: Secondary | ICD-10-CM | POA: Diagnosis not present

## 2022-09-19 DIAGNOSIS — I5181 Takotsubo syndrome: Secondary | ICD-10-CM | POA: Diagnosis not present

## 2022-09-19 DIAGNOSIS — I25110 Atherosclerotic heart disease of native coronary artery with unstable angina pectoris: Secondary | ICD-10-CM | POA: Diagnosis not present

## 2022-09-19 DIAGNOSIS — R1312 Dysphagia, oropharyngeal phase: Secondary | ICD-10-CM | POA: Diagnosis not present

## 2022-09-19 DIAGNOSIS — R2689 Other abnormalities of gait and mobility: Secondary | ICD-10-CM | POA: Diagnosis not present

## 2022-09-23 DIAGNOSIS — I421 Obstructive hypertrophic cardiomyopathy: Secondary | ICD-10-CM | POA: Diagnosis not present

## 2022-09-23 DIAGNOSIS — R293 Abnormal posture: Secondary | ICD-10-CM | POA: Diagnosis not present

## 2022-09-23 DIAGNOSIS — R278 Other lack of coordination: Secondary | ICD-10-CM | POA: Diagnosis not present

## 2022-09-23 DIAGNOSIS — I639 Cerebral infarction, unspecified: Secondary | ICD-10-CM | POA: Diagnosis not present

## 2022-09-23 DIAGNOSIS — I5181 Takotsubo syndrome: Secondary | ICD-10-CM | POA: Diagnosis not present

## 2022-09-23 DIAGNOSIS — R1312 Dysphagia, oropharyngeal phase: Secondary | ICD-10-CM | POA: Diagnosis not present

## 2022-09-23 DIAGNOSIS — I25110 Atherosclerotic heart disease of native coronary artery with unstable angina pectoris: Secondary | ICD-10-CM | POA: Diagnosis not present

## 2022-09-23 DIAGNOSIS — R2689 Other abnormalities of gait and mobility: Secondary | ICD-10-CM | POA: Diagnosis not present

## 2022-09-23 DIAGNOSIS — M6281 Muscle weakness (generalized): Secondary | ICD-10-CM | POA: Diagnosis not present

## 2022-09-29 DIAGNOSIS — N189 Chronic kidney disease, unspecified: Secondary | ICD-10-CM | POA: Diagnosis not present

## 2022-09-29 DIAGNOSIS — I69354 Hemiplegia and hemiparesis following cerebral infarction affecting left non-dominant side: Secondary | ICD-10-CM | POA: Diagnosis not present

## 2022-09-29 DIAGNOSIS — Z7902 Long term (current) use of antithrombotics/antiplatelets: Secondary | ICD-10-CM | POA: Diagnosis not present

## 2022-09-29 DIAGNOSIS — E1122 Type 2 diabetes mellitus with diabetic chronic kidney disease: Secondary | ICD-10-CM | POA: Diagnosis not present

## 2022-09-29 DIAGNOSIS — Z7982 Long term (current) use of aspirin: Secondary | ICD-10-CM | POA: Diagnosis not present

## 2022-10-14 DIAGNOSIS — Z79899 Other long term (current) drug therapy: Secondary | ICD-10-CM | POA: Diagnosis not present

## 2022-10-14 DIAGNOSIS — E782 Mixed hyperlipidemia: Secondary | ICD-10-CM | POA: Diagnosis not present

## 2022-10-14 DIAGNOSIS — I69314 Frontal lobe and executive function deficit following cerebral infarction: Secondary | ICD-10-CM | POA: Diagnosis not present

## 2022-10-14 DIAGNOSIS — I6300 Cerebral infarction due to thrombosis of unspecified precerebral artery: Secondary | ICD-10-CM | POA: Diagnosis not present

## 2022-10-31 ENCOUNTER — Ambulatory Visit: Payer: Medicare Other | Attending: Internal Medicine | Admitting: Internal Medicine

## 2022-11-05 DIAGNOSIS — I69354 Hemiplegia and hemiparesis following cerebral infarction affecting left non-dominant side: Secondary | ICD-10-CM | POA: Diagnosis not present

## 2022-11-05 DIAGNOSIS — N189 Chronic kidney disease, unspecified: Secondary | ICD-10-CM | POA: Diagnosis not present

## 2022-11-05 DIAGNOSIS — D631 Anemia in chronic kidney disease: Secondary | ICD-10-CM | POA: Diagnosis not present

## 2022-11-05 DIAGNOSIS — I2510 Atherosclerotic heart disease of native coronary artery without angina pectoris: Secondary | ICD-10-CM | POA: Diagnosis not present

## 2022-11-05 DIAGNOSIS — E1122 Type 2 diabetes mellitus with diabetic chronic kidney disease: Secondary | ICD-10-CM | POA: Diagnosis not present

## 2022-11-05 DIAGNOSIS — Z7982 Long term (current) use of aspirin: Secondary | ICD-10-CM | POA: Diagnosis not present

## 2022-11-05 DIAGNOSIS — Z7902 Long term (current) use of antithrombotics/antiplatelets: Secondary | ICD-10-CM | POA: Diagnosis not present

## 2022-11-05 DIAGNOSIS — I129 Hypertensive chronic kidney disease with stage 1 through stage 4 chronic kidney disease, or unspecified chronic kidney disease: Secondary | ICD-10-CM | POA: Diagnosis not present

## 2022-11-19 DIAGNOSIS — B359 Dermatophytosis, unspecified: Secondary | ICD-10-CM | POA: Diagnosis not present

## 2022-11-19 DIAGNOSIS — L259 Unspecified contact dermatitis, unspecified cause: Secondary | ICD-10-CM | POA: Diagnosis not present

## 2022-12-03 DIAGNOSIS — R293 Abnormal posture: Secondary | ICD-10-CM | POA: Diagnosis not present

## 2022-12-03 DIAGNOSIS — I25110 Atherosclerotic heart disease of native coronary artery with unstable angina pectoris: Secondary | ICD-10-CM | POA: Diagnosis not present

## 2022-12-03 DIAGNOSIS — I421 Obstructive hypertrophic cardiomyopathy: Secondary | ICD-10-CM | POA: Diagnosis not present

## 2022-12-03 DIAGNOSIS — R2689 Other abnormalities of gait and mobility: Secondary | ICD-10-CM | POA: Diagnosis not present

## 2022-12-03 DIAGNOSIS — I5181 Takotsubo syndrome: Secondary | ICD-10-CM | POA: Diagnosis not present

## 2022-12-03 DIAGNOSIS — M6281 Muscle weakness (generalized): Secondary | ICD-10-CM | POA: Diagnosis not present

## 2022-12-03 DIAGNOSIS — R1312 Dysphagia, oropharyngeal phase: Secondary | ICD-10-CM | POA: Diagnosis not present

## 2022-12-03 DIAGNOSIS — I639 Cerebral infarction, unspecified: Secondary | ICD-10-CM | POA: Diagnosis not present

## 2022-12-03 DIAGNOSIS — R278 Other lack of coordination: Secondary | ICD-10-CM | POA: Diagnosis not present

## 2022-12-04 DIAGNOSIS — I25110 Atherosclerotic heart disease of native coronary artery with unstable angina pectoris: Secondary | ICD-10-CM | POA: Diagnosis not present

## 2022-12-04 DIAGNOSIS — R2689 Other abnormalities of gait and mobility: Secondary | ICD-10-CM | POA: Diagnosis not present

## 2022-12-04 DIAGNOSIS — M6281 Muscle weakness (generalized): Secondary | ICD-10-CM | POA: Diagnosis not present

## 2022-12-04 DIAGNOSIS — R293 Abnormal posture: Secondary | ICD-10-CM | POA: Diagnosis not present

## 2022-12-04 DIAGNOSIS — I639 Cerebral infarction, unspecified: Secondary | ICD-10-CM | POA: Diagnosis not present

## 2022-12-04 DIAGNOSIS — I5181 Takotsubo syndrome: Secondary | ICD-10-CM | POA: Diagnosis not present

## 2022-12-04 DIAGNOSIS — R278 Other lack of coordination: Secondary | ICD-10-CM | POA: Diagnosis not present

## 2022-12-04 DIAGNOSIS — R1312 Dysphagia, oropharyngeal phase: Secondary | ICD-10-CM | POA: Diagnosis not present

## 2022-12-04 DIAGNOSIS — I421 Obstructive hypertrophic cardiomyopathy: Secondary | ICD-10-CM | POA: Diagnosis not present

## 2022-12-05 DIAGNOSIS — R293 Abnormal posture: Secondary | ICD-10-CM | POA: Diagnosis not present

## 2022-12-05 DIAGNOSIS — R1312 Dysphagia, oropharyngeal phase: Secondary | ICD-10-CM | POA: Diagnosis not present

## 2022-12-05 DIAGNOSIS — I5181 Takotsubo syndrome: Secondary | ICD-10-CM | POA: Diagnosis not present

## 2022-12-05 DIAGNOSIS — R2689 Other abnormalities of gait and mobility: Secondary | ICD-10-CM | POA: Diagnosis not present

## 2022-12-05 DIAGNOSIS — I421 Obstructive hypertrophic cardiomyopathy: Secondary | ICD-10-CM | POA: Diagnosis not present

## 2022-12-05 DIAGNOSIS — M6281 Muscle weakness (generalized): Secondary | ICD-10-CM | POA: Diagnosis not present

## 2022-12-05 DIAGNOSIS — I639 Cerebral infarction, unspecified: Secondary | ICD-10-CM | POA: Diagnosis not present

## 2022-12-05 DIAGNOSIS — R278 Other lack of coordination: Secondary | ICD-10-CM | POA: Diagnosis not present

## 2022-12-05 DIAGNOSIS — I25110 Atherosclerotic heart disease of native coronary artery with unstable angina pectoris: Secondary | ICD-10-CM | POA: Diagnosis not present

## 2022-12-08 DIAGNOSIS — M6281 Muscle weakness (generalized): Secondary | ICD-10-CM | POA: Diagnosis not present

## 2022-12-08 DIAGNOSIS — I639 Cerebral infarction, unspecified: Secondary | ICD-10-CM | POA: Diagnosis not present

## 2022-12-08 DIAGNOSIS — I25110 Atherosclerotic heart disease of native coronary artery with unstable angina pectoris: Secondary | ICD-10-CM | POA: Diagnosis not present

## 2022-12-08 DIAGNOSIS — R1312 Dysphagia, oropharyngeal phase: Secondary | ICD-10-CM | POA: Diagnosis not present

## 2022-12-08 DIAGNOSIS — I5181 Takotsubo syndrome: Secondary | ICD-10-CM | POA: Diagnosis not present

## 2022-12-08 DIAGNOSIS — R2689 Other abnormalities of gait and mobility: Secondary | ICD-10-CM | POA: Diagnosis not present

## 2022-12-08 DIAGNOSIS — R293 Abnormal posture: Secondary | ICD-10-CM | POA: Diagnosis not present

## 2022-12-08 DIAGNOSIS — R278 Other lack of coordination: Secondary | ICD-10-CM | POA: Diagnosis not present

## 2022-12-08 DIAGNOSIS — I421 Obstructive hypertrophic cardiomyopathy: Secondary | ICD-10-CM | POA: Diagnosis not present

## 2022-12-09 DIAGNOSIS — R278 Other lack of coordination: Secondary | ICD-10-CM | POA: Diagnosis not present

## 2022-12-09 DIAGNOSIS — I25110 Atherosclerotic heart disease of native coronary artery with unstable angina pectoris: Secondary | ICD-10-CM | POA: Diagnosis not present

## 2022-12-09 DIAGNOSIS — R2689 Other abnormalities of gait and mobility: Secondary | ICD-10-CM | POA: Diagnosis not present

## 2022-12-09 DIAGNOSIS — M6281 Muscle weakness (generalized): Secondary | ICD-10-CM | POA: Diagnosis not present

## 2022-12-09 DIAGNOSIS — R293 Abnormal posture: Secondary | ICD-10-CM | POA: Diagnosis not present

## 2022-12-09 DIAGNOSIS — R1312 Dysphagia, oropharyngeal phase: Secondary | ICD-10-CM | POA: Diagnosis not present

## 2022-12-09 DIAGNOSIS — I5181 Takotsubo syndrome: Secondary | ICD-10-CM | POA: Diagnosis not present

## 2022-12-09 DIAGNOSIS — I421 Obstructive hypertrophic cardiomyopathy: Secondary | ICD-10-CM | POA: Diagnosis not present

## 2022-12-09 DIAGNOSIS — I639 Cerebral infarction, unspecified: Secondary | ICD-10-CM | POA: Diagnosis not present

## 2022-12-10 DIAGNOSIS — I5181 Takotsubo syndrome: Secondary | ICD-10-CM | POA: Diagnosis not present

## 2022-12-10 DIAGNOSIS — R1312 Dysphagia, oropharyngeal phase: Secondary | ICD-10-CM | POA: Diagnosis not present

## 2022-12-10 DIAGNOSIS — R293 Abnormal posture: Secondary | ICD-10-CM | POA: Diagnosis not present

## 2022-12-10 DIAGNOSIS — I421 Obstructive hypertrophic cardiomyopathy: Secondary | ICD-10-CM | POA: Diagnosis not present

## 2022-12-10 DIAGNOSIS — I639 Cerebral infarction, unspecified: Secondary | ICD-10-CM | POA: Diagnosis not present

## 2022-12-10 DIAGNOSIS — M6281 Muscle weakness (generalized): Secondary | ICD-10-CM | POA: Diagnosis not present

## 2022-12-10 DIAGNOSIS — R278 Other lack of coordination: Secondary | ICD-10-CM | POA: Diagnosis not present

## 2022-12-10 DIAGNOSIS — R2689 Other abnormalities of gait and mobility: Secondary | ICD-10-CM | POA: Diagnosis not present

## 2022-12-10 DIAGNOSIS — I25110 Atherosclerotic heart disease of native coronary artery with unstable angina pectoris: Secondary | ICD-10-CM | POA: Diagnosis not present

## 2022-12-11 DIAGNOSIS — I421 Obstructive hypertrophic cardiomyopathy: Secondary | ICD-10-CM | POA: Diagnosis not present

## 2022-12-11 DIAGNOSIS — R1312 Dysphagia, oropharyngeal phase: Secondary | ICD-10-CM | POA: Diagnosis not present

## 2022-12-11 DIAGNOSIS — E782 Mixed hyperlipidemia: Secondary | ICD-10-CM | POA: Diagnosis not present

## 2022-12-11 DIAGNOSIS — I25110 Atherosclerotic heart disease of native coronary artery with unstable angina pectoris: Secondary | ICD-10-CM | POA: Diagnosis not present

## 2022-12-11 DIAGNOSIS — I77810 Thoracic aortic ectasia: Secondary | ICD-10-CM | POA: Diagnosis not present

## 2022-12-11 DIAGNOSIS — M6281 Muscle weakness (generalized): Secondary | ICD-10-CM | POA: Diagnosis not present

## 2022-12-11 DIAGNOSIS — R293 Abnormal posture: Secondary | ICD-10-CM | POA: Diagnosis not present

## 2022-12-11 DIAGNOSIS — Z8679 Personal history of other diseases of the circulatory system: Secondary | ICD-10-CM | POA: Diagnosis not present

## 2022-12-11 DIAGNOSIS — I639 Cerebral infarction, unspecified: Secondary | ICD-10-CM | POA: Diagnosis not present

## 2022-12-11 DIAGNOSIS — R2689 Other abnormalities of gait and mobility: Secondary | ICD-10-CM | POA: Diagnosis not present

## 2022-12-11 DIAGNOSIS — R278 Other lack of coordination: Secondary | ICD-10-CM | POA: Diagnosis not present

## 2022-12-11 DIAGNOSIS — I5181 Takotsubo syndrome: Secondary | ICD-10-CM | POA: Diagnosis not present

## 2022-12-11 DIAGNOSIS — I10 Essential (primary) hypertension: Secondary | ICD-10-CM | POA: Diagnosis not present

## 2022-12-11 DIAGNOSIS — I619 Nontraumatic intracerebral hemorrhage, unspecified: Secondary | ICD-10-CM | POA: Diagnosis not present

## 2022-12-12 DIAGNOSIS — I5181 Takotsubo syndrome: Secondary | ICD-10-CM | POA: Diagnosis not present

## 2022-12-12 DIAGNOSIS — I639 Cerebral infarction, unspecified: Secondary | ICD-10-CM | POA: Diagnosis not present

## 2022-12-12 DIAGNOSIS — R1312 Dysphagia, oropharyngeal phase: Secondary | ICD-10-CM | POA: Diagnosis not present

## 2022-12-12 DIAGNOSIS — I25110 Atherosclerotic heart disease of native coronary artery with unstable angina pectoris: Secondary | ICD-10-CM | POA: Diagnosis not present

## 2022-12-12 DIAGNOSIS — M6281 Muscle weakness (generalized): Secondary | ICD-10-CM | POA: Diagnosis not present

## 2022-12-12 DIAGNOSIS — I421 Obstructive hypertrophic cardiomyopathy: Secondary | ICD-10-CM | POA: Diagnosis not present

## 2022-12-12 DIAGNOSIS — R2689 Other abnormalities of gait and mobility: Secondary | ICD-10-CM | POA: Diagnosis not present

## 2022-12-12 DIAGNOSIS — R293 Abnormal posture: Secondary | ICD-10-CM | POA: Diagnosis not present

## 2022-12-12 DIAGNOSIS — R278 Other lack of coordination: Secondary | ICD-10-CM | POA: Diagnosis not present

## 2022-12-15 DIAGNOSIS — R1312 Dysphagia, oropharyngeal phase: Secondary | ICD-10-CM | POA: Diagnosis not present

## 2022-12-15 DIAGNOSIS — I5181 Takotsubo syndrome: Secondary | ICD-10-CM | POA: Diagnosis not present

## 2022-12-15 DIAGNOSIS — R293 Abnormal posture: Secondary | ICD-10-CM | POA: Diagnosis not present

## 2022-12-15 DIAGNOSIS — M6281 Muscle weakness (generalized): Secondary | ICD-10-CM | POA: Diagnosis not present

## 2022-12-15 DIAGNOSIS — I25110 Atherosclerotic heart disease of native coronary artery with unstable angina pectoris: Secondary | ICD-10-CM | POA: Diagnosis not present

## 2022-12-15 DIAGNOSIS — I421 Obstructive hypertrophic cardiomyopathy: Secondary | ICD-10-CM | POA: Diagnosis not present

## 2022-12-15 DIAGNOSIS — R278 Other lack of coordination: Secondary | ICD-10-CM | POA: Diagnosis not present

## 2022-12-15 DIAGNOSIS — R2689 Other abnormalities of gait and mobility: Secondary | ICD-10-CM | POA: Diagnosis not present

## 2022-12-15 DIAGNOSIS — I639 Cerebral infarction, unspecified: Secondary | ICD-10-CM | POA: Diagnosis not present

## 2022-12-16 DIAGNOSIS — M6281 Muscle weakness (generalized): Secondary | ICD-10-CM | POA: Diagnosis not present

## 2022-12-16 DIAGNOSIS — R293 Abnormal posture: Secondary | ICD-10-CM | POA: Diagnosis not present

## 2022-12-16 DIAGNOSIS — I5181 Takotsubo syndrome: Secondary | ICD-10-CM | POA: Diagnosis not present

## 2022-12-16 DIAGNOSIS — R278 Other lack of coordination: Secondary | ICD-10-CM | POA: Diagnosis not present

## 2022-12-16 DIAGNOSIS — I639 Cerebral infarction, unspecified: Secondary | ICD-10-CM | POA: Diagnosis not present

## 2022-12-16 DIAGNOSIS — I25110 Atherosclerotic heart disease of native coronary artery with unstable angina pectoris: Secondary | ICD-10-CM | POA: Diagnosis not present

## 2022-12-16 DIAGNOSIS — I421 Obstructive hypertrophic cardiomyopathy: Secondary | ICD-10-CM | POA: Diagnosis not present

## 2022-12-16 DIAGNOSIS — R1312 Dysphagia, oropharyngeal phase: Secondary | ICD-10-CM | POA: Diagnosis not present

## 2022-12-16 DIAGNOSIS — R2689 Other abnormalities of gait and mobility: Secondary | ICD-10-CM | POA: Diagnosis not present

## 2022-12-17 DIAGNOSIS — R293 Abnormal posture: Secondary | ICD-10-CM | POA: Diagnosis not present

## 2022-12-17 DIAGNOSIS — I421 Obstructive hypertrophic cardiomyopathy: Secondary | ICD-10-CM | POA: Diagnosis not present

## 2022-12-17 DIAGNOSIS — R1312 Dysphagia, oropharyngeal phase: Secondary | ICD-10-CM | POA: Diagnosis not present

## 2022-12-17 DIAGNOSIS — I639 Cerebral infarction, unspecified: Secondary | ICD-10-CM | POA: Diagnosis not present

## 2022-12-17 DIAGNOSIS — M6281 Muscle weakness (generalized): Secondary | ICD-10-CM | POA: Diagnosis not present

## 2022-12-17 DIAGNOSIS — I5181 Takotsubo syndrome: Secondary | ICD-10-CM | POA: Diagnosis not present

## 2022-12-17 DIAGNOSIS — I25110 Atherosclerotic heart disease of native coronary artery with unstable angina pectoris: Secondary | ICD-10-CM | POA: Diagnosis not present

## 2022-12-17 DIAGNOSIS — R2689 Other abnormalities of gait and mobility: Secondary | ICD-10-CM | POA: Diagnosis not present

## 2022-12-17 DIAGNOSIS — R278 Other lack of coordination: Secondary | ICD-10-CM | POA: Diagnosis not present

## 2022-12-18 DIAGNOSIS — M6281 Muscle weakness (generalized): Secondary | ICD-10-CM | POA: Diagnosis not present

## 2022-12-18 DIAGNOSIS — R278 Other lack of coordination: Secondary | ICD-10-CM | POA: Diagnosis not present

## 2022-12-18 DIAGNOSIS — I25110 Atherosclerotic heart disease of native coronary artery with unstable angina pectoris: Secondary | ICD-10-CM | POA: Diagnosis not present

## 2022-12-18 DIAGNOSIS — I421 Obstructive hypertrophic cardiomyopathy: Secondary | ICD-10-CM | POA: Diagnosis not present

## 2022-12-18 DIAGNOSIS — R1312 Dysphagia, oropharyngeal phase: Secondary | ICD-10-CM | POA: Diagnosis not present

## 2022-12-18 DIAGNOSIS — I639 Cerebral infarction, unspecified: Secondary | ICD-10-CM | POA: Diagnosis not present

## 2022-12-18 DIAGNOSIS — R293 Abnormal posture: Secondary | ICD-10-CM | POA: Diagnosis not present

## 2022-12-18 DIAGNOSIS — I5181 Takotsubo syndrome: Secondary | ICD-10-CM | POA: Diagnosis not present

## 2022-12-18 DIAGNOSIS — R2689 Other abnormalities of gait and mobility: Secondary | ICD-10-CM | POA: Diagnosis not present

## 2022-12-19 DIAGNOSIS — I639 Cerebral infarction, unspecified: Secondary | ICD-10-CM | POA: Diagnosis not present

## 2022-12-19 DIAGNOSIS — I25110 Atherosclerotic heart disease of native coronary artery with unstable angina pectoris: Secondary | ICD-10-CM | POA: Diagnosis not present

## 2022-12-19 DIAGNOSIS — I5181 Takotsubo syndrome: Secondary | ICD-10-CM | POA: Diagnosis not present

## 2022-12-19 DIAGNOSIS — M6281 Muscle weakness (generalized): Secondary | ICD-10-CM | POA: Diagnosis not present

## 2022-12-19 DIAGNOSIS — R293 Abnormal posture: Secondary | ICD-10-CM | POA: Diagnosis not present

## 2022-12-19 DIAGNOSIS — R2689 Other abnormalities of gait and mobility: Secondary | ICD-10-CM | POA: Diagnosis not present

## 2022-12-19 DIAGNOSIS — I421 Obstructive hypertrophic cardiomyopathy: Secondary | ICD-10-CM | POA: Diagnosis not present

## 2022-12-19 DIAGNOSIS — R278 Other lack of coordination: Secondary | ICD-10-CM | POA: Diagnosis not present

## 2022-12-19 DIAGNOSIS — R1312 Dysphagia, oropharyngeal phase: Secondary | ICD-10-CM | POA: Diagnosis not present

## 2022-12-22 DIAGNOSIS — M6281 Muscle weakness (generalized): Secondary | ICD-10-CM | POA: Diagnosis not present

## 2022-12-22 DIAGNOSIS — R2689 Other abnormalities of gait and mobility: Secondary | ICD-10-CM | POA: Diagnosis not present

## 2022-12-22 DIAGNOSIS — R1312 Dysphagia, oropharyngeal phase: Secondary | ICD-10-CM | POA: Diagnosis not present

## 2022-12-22 DIAGNOSIS — I639 Cerebral infarction, unspecified: Secondary | ICD-10-CM | POA: Diagnosis not present

## 2022-12-22 DIAGNOSIS — I421 Obstructive hypertrophic cardiomyopathy: Secondary | ICD-10-CM | POA: Diagnosis not present

## 2022-12-22 DIAGNOSIS — I5181 Takotsubo syndrome: Secondary | ICD-10-CM | POA: Diagnosis not present

## 2022-12-22 DIAGNOSIS — I25110 Atherosclerotic heart disease of native coronary artery with unstable angina pectoris: Secondary | ICD-10-CM | POA: Diagnosis not present

## 2022-12-22 DIAGNOSIS — R278 Other lack of coordination: Secondary | ICD-10-CM | POA: Diagnosis not present

## 2022-12-22 DIAGNOSIS — R293 Abnormal posture: Secondary | ICD-10-CM | POA: Diagnosis not present

## 2022-12-23 DIAGNOSIS — G8929 Other chronic pain: Secondary | ICD-10-CM | POA: Diagnosis not present

## 2023-01-05 DIAGNOSIS — I69354 Hemiplegia and hemiparesis following cerebral infarction affecting left non-dominant side: Secondary | ICD-10-CM | POA: Diagnosis not present

## 2023-01-05 DIAGNOSIS — M1990 Unspecified osteoarthritis, unspecified site: Secondary | ICD-10-CM | POA: Diagnosis not present

## 2023-01-05 DIAGNOSIS — K219 Gastro-esophageal reflux disease without esophagitis: Secondary | ICD-10-CM | POA: Diagnosis not present

## 2023-01-05 DIAGNOSIS — I5181 Takotsubo syndrome: Secondary | ICD-10-CM | POA: Diagnosis not present

## 2023-01-05 DIAGNOSIS — Z7902 Long term (current) use of antithrombotics/antiplatelets: Secondary | ICD-10-CM | POA: Diagnosis not present

## 2023-01-05 DIAGNOSIS — N184 Chronic kidney disease, stage 4 (severe): Secondary | ICD-10-CM | POA: Diagnosis not present

## 2023-01-05 DIAGNOSIS — I2510 Atherosclerotic heart disease of native coronary artery without angina pectoris: Secondary | ICD-10-CM | POA: Diagnosis not present

## 2023-01-05 DIAGNOSIS — I77810 Thoracic aortic ectasia: Secondary | ICD-10-CM | POA: Diagnosis not present

## 2023-01-05 DIAGNOSIS — D631 Anemia in chronic kidney disease: Secondary | ICD-10-CM | POA: Diagnosis not present

## 2023-01-05 DIAGNOSIS — I129 Hypertensive chronic kidney disease with stage 1 through stage 4 chronic kidney disease, or unspecified chronic kidney disease: Secondary | ICD-10-CM | POA: Diagnosis not present

## 2023-01-05 DIAGNOSIS — K5900 Constipation, unspecified: Secondary | ICD-10-CM | POA: Diagnosis not present

## 2023-01-05 DIAGNOSIS — Z993 Dependence on wheelchair: Secondary | ICD-10-CM | POA: Diagnosis not present

## 2023-01-05 DIAGNOSIS — Z7982 Long term (current) use of aspirin: Secondary | ICD-10-CM | POA: Diagnosis not present

## 2023-01-05 DIAGNOSIS — E1122 Type 2 diabetes mellitus with diabetic chronic kidney disease: Secondary | ICD-10-CM | POA: Diagnosis not present

## 2023-01-05 DIAGNOSIS — I422 Other hypertrophic cardiomyopathy: Secondary | ICD-10-CM | POA: Diagnosis not present

## 2023-01-05 DIAGNOSIS — E559 Vitamin D deficiency, unspecified: Secondary | ICD-10-CM | POA: Diagnosis not present

## 2023-01-13 DIAGNOSIS — I10 Essential (primary) hypertension: Secondary | ICD-10-CM | POA: Diagnosis not present

## 2023-01-14 DIAGNOSIS — M6281 Muscle weakness (generalized): Secondary | ICD-10-CM | POA: Diagnosis not present

## 2023-01-14 DIAGNOSIS — R293 Abnormal posture: Secondary | ICD-10-CM | POA: Diagnosis not present

## 2023-01-14 DIAGNOSIS — I421 Obstructive hypertrophic cardiomyopathy: Secondary | ICD-10-CM | POA: Diagnosis not present

## 2023-01-14 DIAGNOSIS — I5181 Takotsubo syndrome: Secondary | ICD-10-CM | POA: Diagnosis not present

## 2023-01-14 DIAGNOSIS — R2689 Other abnormalities of gait and mobility: Secondary | ICD-10-CM | POA: Diagnosis not present

## 2023-01-14 DIAGNOSIS — I639 Cerebral infarction, unspecified: Secondary | ICD-10-CM | POA: Diagnosis not present

## 2023-01-14 DIAGNOSIS — I25110 Atherosclerotic heart disease of native coronary artery with unstable angina pectoris: Secondary | ICD-10-CM | POA: Diagnosis not present

## 2023-01-14 DIAGNOSIS — R1312 Dysphagia, oropharyngeal phase: Secondary | ICD-10-CM | POA: Diagnosis not present

## 2023-01-14 DIAGNOSIS — R278 Other lack of coordination: Secondary | ICD-10-CM | POA: Diagnosis not present

## 2023-01-15 DIAGNOSIS — I25110 Atherosclerotic heart disease of native coronary artery with unstable angina pectoris: Secondary | ICD-10-CM | POA: Diagnosis not present

## 2023-01-15 DIAGNOSIS — I5181 Takotsubo syndrome: Secondary | ICD-10-CM | POA: Diagnosis not present

## 2023-01-15 DIAGNOSIS — R293 Abnormal posture: Secondary | ICD-10-CM | POA: Diagnosis not present

## 2023-01-15 DIAGNOSIS — I639 Cerebral infarction, unspecified: Secondary | ICD-10-CM | POA: Diagnosis not present

## 2023-01-15 DIAGNOSIS — R2689 Other abnormalities of gait and mobility: Secondary | ICD-10-CM | POA: Diagnosis not present

## 2023-01-15 DIAGNOSIS — I421 Obstructive hypertrophic cardiomyopathy: Secondary | ICD-10-CM | POA: Diagnosis not present

## 2023-01-15 DIAGNOSIS — R278 Other lack of coordination: Secondary | ICD-10-CM | POA: Diagnosis not present

## 2023-01-15 DIAGNOSIS — R1312 Dysphagia, oropharyngeal phase: Secondary | ICD-10-CM | POA: Diagnosis not present

## 2023-01-15 DIAGNOSIS — M6281 Muscle weakness (generalized): Secondary | ICD-10-CM | POA: Diagnosis not present

## 2023-01-16 DIAGNOSIS — R293 Abnormal posture: Secondary | ICD-10-CM | POA: Diagnosis not present

## 2023-01-16 DIAGNOSIS — R278 Other lack of coordination: Secondary | ICD-10-CM | POA: Diagnosis not present

## 2023-01-16 DIAGNOSIS — I421 Obstructive hypertrophic cardiomyopathy: Secondary | ICD-10-CM | POA: Diagnosis not present

## 2023-01-16 DIAGNOSIS — I639 Cerebral infarction, unspecified: Secondary | ICD-10-CM | POA: Diagnosis not present

## 2023-01-16 DIAGNOSIS — M6281 Muscle weakness (generalized): Secondary | ICD-10-CM | POA: Diagnosis not present

## 2023-01-16 DIAGNOSIS — R2689 Other abnormalities of gait and mobility: Secondary | ICD-10-CM | POA: Diagnosis not present

## 2023-01-16 DIAGNOSIS — R1312 Dysphagia, oropharyngeal phase: Secondary | ICD-10-CM | POA: Diagnosis not present

## 2023-01-16 DIAGNOSIS — I25110 Atherosclerotic heart disease of native coronary artery with unstable angina pectoris: Secondary | ICD-10-CM | POA: Diagnosis not present

## 2023-01-16 DIAGNOSIS — I5181 Takotsubo syndrome: Secondary | ICD-10-CM | POA: Diagnosis not present

## 2023-01-19 DIAGNOSIS — R1312 Dysphagia, oropharyngeal phase: Secondary | ICD-10-CM | POA: Diagnosis not present

## 2023-01-19 DIAGNOSIS — I25110 Atherosclerotic heart disease of native coronary artery with unstable angina pectoris: Secondary | ICD-10-CM | POA: Diagnosis not present

## 2023-01-19 DIAGNOSIS — I5181 Takotsubo syndrome: Secondary | ICD-10-CM | POA: Diagnosis not present

## 2023-01-19 DIAGNOSIS — R278 Other lack of coordination: Secondary | ICD-10-CM | POA: Diagnosis not present

## 2023-01-19 DIAGNOSIS — R2689 Other abnormalities of gait and mobility: Secondary | ICD-10-CM | POA: Diagnosis not present

## 2023-01-19 DIAGNOSIS — R293 Abnormal posture: Secondary | ICD-10-CM | POA: Diagnosis not present

## 2023-01-19 DIAGNOSIS — I421 Obstructive hypertrophic cardiomyopathy: Secondary | ICD-10-CM | POA: Diagnosis not present

## 2023-01-19 DIAGNOSIS — M6281 Muscle weakness (generalized): Secondary | ICD-10-CM | POA: Diagnosis not present

## 2023-01-19 DIAGNOSIS — I639 Cerebral infarction, unspecified: Secondary | ICD-10-CM | POA: Diagnosis not present

## 2023-01-20 DIAGNOSIS — G4700 Insomnia, unspecified: Secondary | ICD-10-CM | POA: Diagnosis not present

## 2023-01-20 DIAGNOSIS — I639 Cerebral infarction, unspecified: Secondary | ICD-10-CM | POA: Diagnosis not present

## 2023-01-20 DIAGNOSIS — R1312 Dysphagia, oropharyngeal phase: Secondary | ICD-10-CM | POA: Diagnosis not present

## 2023-01-20 DIAGNOSIS — E1122 Type 2 diabetes mellitus with diabetic chronic kidney disease: Secondary | ICD-10-CM | POA: Diagnosis not present

## 2023-01-20 DIAGNOSIS — I5181 Takotsubo syndrome: Secondary | ICD-10-CM | POA: Diagnosis not present

## 2023-01-20 DIAGNOSIS — R278 Other lack of coordination: Secondary | ICD-10-CM | POA: Diagnosis not present

## 2023-01-20 DIAGNOSIS — G8929 Other chronic pain: Secondary | ICD-10-CM | POA: Diagnosis not present

## 2023-01-20 DIAGNOSIS — R2689 Other abnormalities of gait and mobility: Secondary | ICD-10-CM | POA: Diagnosis not present

## 2023-01-20 DIAGNOSIS — I421 Obstructive hypertrophic cardiomyopathy: Secondary | ICD-10-CM | POA: Diagnosis not present

## 2023-01-20 DIAGNOSIS — I25110 Atherosclerotic heart disease of native coronary artery with unstable angina pectoris: Secondary | ICD-10-CM | POA: Diagnosis not present

## 2023-01-20 DIAGNOSIS — M6281 Muscle weakness (generalized): Secondary | ICD-10-CM | POA: Diagnosis not present

## 2023-01-20 DIAGNOSIS — R293 Abnormal posture: Secondary | ICD-10-CM | POA: Diagnosis not present

## 2023-01-21 DIAGNOSIS — R293 Abnormal posture: Secondary | ICD-10-CM | POA: Diagnosis not present

## 2023-01-21 DIAGNOSIS — M6281 Muscle weakness (generalized): Secondary | ICD-10-CM | POA: Diagnosis not present

## 2023-01-21 DIAGNOSIS — I421 Obstructive hypertrophic cardiomyopathy: Secondary | ICD-10-CM | POA: Diagnosis not present

## 2023-01-21 DIAGNOSIS — R2689 Other abnormalities of gait and mobility: Secondary | ICD-10-CM | POA: Diagnosis not present

## 2023-01-21 DIAGNOSIS — R278 Other lack of coordination: Secondary | ICD-10-CM | POA: Diagnosis not present

## 2023-01-21 DIAGNOSIS — I5181 Takotsubo syndrome: Secondary | ICD-10-CM | POA: Diagnosis not present

## 2023-01-21 DIAGNOSIS — R1312 Dysphagia, oropharyngeal phase: Secondary | ICD-10-CM | POA: Diagnosis not present

## 2023-01-21 DIAGNOSIS — I639 Cerebral infarction, unspecified: Secondary | ICD-10-CM | POA: Diagnosis not present

## 2023-01-21 DIAGNOSIS — I25110 Atherosclerotic heart disease of native coronary artery with unstable angina pectoris: Secondary | ICD-10-CM | POA: Diagnosis not present

## 2023-01-22 DIAGNOSIS — R2689 Other abnormalities of gait and mobility: Secondary | ICD-10-CM | POA: Diagnosis not present

## 2023-01-22 DIAGNOSIS — R1312 Dysphagia, oropharyngeal phase: Secondary | ICD-10-CM | POA: Diagnosis not present

## 2023-01-22 DIAGNOSIS — I421 Obstructive hypertrophic cardiomyopathy: Secondary | ICD-10-CM | POA: Diagnosis not present

## 2023-01-22 DIAGNOSIS — I639 Cerebral infarction, unspecified: Secondary | ICD-10-CM | POA: Diagnosis not present

## 2023-01-22 DIAGNOSIS — R278 Other lack of coordination: Secondary | ICD-10-CM | POA: Diagnosis not present

## 2023-01-22 DIAGNOSIS — I25110 Atherosclerotic heart disease of native coronary artery with unstable angina pectoris: Secondary | ICD-10-CM | POA: Diagnosis not present

## 2023-01-22 DIAGNOSIS — I5181 Takotsubo syndrome: Secondary | ICD-10-CM | POA: Diagnosis not present

## 2023-01-22 DIAGNOSIS — M6281 Muscle weakness (generalized): Secondary | ICD-10-CM | POA: Diagnosis not present

## 2023-01-22 DIAGNOSIS — R293 Abnormal posture: Secondary | ICD-10-CM | POA: Diagnosis not present

## 2023-01-23 DIAGNOSIS — R2689 Other abnormalities of gait and mobility: Secondary | ICD-10-CM | POA: Diagnosis not present

## 2023-01-23 DIAGNOSIS — R293 Abnormal posture: Secondary | ICD-10-CM | POA: Diagnosis not present

## 2023-01-23 DIAGNOSIS — I639 Cerebral infarction, unspecified: Secondary | ICD-10-CM | POA: Diagnosis not present

## 2023-01-23 DIAGNOSIS — R278 Other lack of coordination: Secondary | ICD-10-CM | POA: Diagnosis not present

## 2023-01-23 DIAGNOSIS — I5181 Takotsubo syndrome: Secondary | ICD-10-CM | POA: Diagnosis not present

## 2023-01-23 DIAGNOSIS — M6281 Muscle weakness (generalized): Secondary | ICD-10-CM | POA: Diagnosis not present

## 2023-01-23 DIAGNOSIS — I421 Obstructive hypertrophic cardiomyopathy: Secondary | ICD-10-CM | POA: Diagnosis not present

## 2023-01-23 DIAGNOSIS — I25110 Atherosclerotic heart disease of native coronary artery with unstable angina pectoris: Secondary | ICD-10-CM | POA: Diagnosis not present

## 2023-01-23 DIAGNOSIS — R1312 Dysphagia, oropharyngeal phase: Secondary | ICD-10-CM | POA: Diagnosis not present

## 2023-01-27 DIAGNOSIS — R1312 Dysphagia, oropharyngeal phase: Secondary | ICD-10-CM | POA: Diagnosis not present

## 2023-01-27 DIAGNOSIS — R293 Abnormal posture: Secondary | ICD-10-CM | POA: Diagnosis not present

## 2023-01-27 DIAGNOSIS — I25110 Atherosclerotic heart disease of native coronary artery with unstable angina pectoris: Secondary | ICD-10-CM | POA: Diagnosis not present

## 2023-01-27 DIAGNOSIS — M6281 Muscle weakness (generalized): Secondary | ICD-10-CM | POA: Diagnosis not present

## 2023-01-27 DIAGNOSIS — I421 Obstructive hypertrophic cardiomyopathy: Secondary | ICD-10-CM | POA: Diagnosis not present

## 2023-01-27 DIAGNOSIS — I639 Cerebral infarction, unspecified: Secondary | ICD-10-CM | POA: Diagnosis not present

## 2023-01-27 DIAGNOSIS — R2689 Other abnormalities of gait and mobility: Secondary | ICD-10-CM | POA: Diagnosis not present

## 2023-01-27 DIAGNOSIS — R278 Other lack of coordination: Secondary | ICD-10-CM | POA: Diagnosis not present

## 2023-01-27 DIAGNOSIS — I5181 Takotsubo syndrome: Secondary | ICD-10-CM | POA: Diagnosis not present

## 2023-01-29 DIAGNOSIS — R1312 Dysphagia, oropharyngeal phase: Secondary | ICD-10-CM | POA: Diagnosis not present

## 2023-01-29 DIAGNOSIS — I639 Cerebral infarction, unspecified: Secondary | ICD-10-CM | POA: Diagnosis not present

## 2023-01-29 DIAGNOSIS — I5181 Takotsubo syndrome: Secondary | ICD-10-CM | POA: Diagnosis not present

## 2023-01-29 DIAGNOSIS — I25110 Atherosclerotic heart disease of native coronary artery with unstable angina pectoris: Secondary | ICD-10-CM | POA: Diagnosis not present

## 2023-01-29 DIAGNOSIS — R278 Other lack of coordination: Secondary | ICD-10-CM | POA: Diagnosis not present

## 2023-01-29 DIAGNOSIS — I421 Obstructive hypertrophic cardiomyopathy: Secondary | ICD-10-CM | POA: Diagnosis not present

## 2023-01-29 DIAGNOSIS — R293 Abnormal posture: Secondary | ICD-10-CM | POA: Diagnosis not present

## 2023-01-29 DIAGNOSIS — M6281 Muscle weakness (generalized): Secondary | ICD-10-CM | POA: Diagnosis not present

## 2023-01-29 DIAGNOSIS — R2689 Other abnormalities of gait and mobility: Secondary | ICD-10-CM | POA: Diagnosis not present

## 2023-01-30 DIAGNOSIS — R293 Abnormal posture: Secondary | ICD-10-CM | POA: Diagnosis not present

## 2023-01-30 DIAGNOSIS — R2689 Other abnormalities of gait and mobility: Secondary | ICD-10-CM | POA: Diagnosis not present

## 2023-01-30 DIAGNOSIS — I639 Cerebral infarction, unspecified: Secondary | ICD-10-CM | POA: Diagnosis not present

## 2023-01-30 DIAGNOSIS — R1312 Dysphagia, oropharyngeal phase: Secondary | ICD-10-CM | POA: Diagnosis not present

## 2023-01-30 DIAGNOSIS — I5181 Takotsubo syndrome: Secondary | ICD-10-CM | POA: Diagnosis not present

## 2023-01-30 DIAGNOSIS — R278 Other lack of coordination: Secondary | ICD-10-CM | POA: Diagnosis not present

## 2023-01-30 DIAGNOSIS — M6281 Muscle weakness (generalized): Secondary | ICD-10-CM | POA: Diagnosis not present

## 2023-01-30 DIAGNOSIS — I25110 Atherosclerotic heart disease of native coronary artery with unstable angina pectoris: Secondary | ICD-10-CM | POA: Diagnosis not present

## 2023-01-30 DIAGNOSIS — I421 Obstructive hypertrophic cardiomyopathy: Secondary | ICD-10-CM | POA: Diagnosis not present

## 2023-01-31 DIAGNOSIS — R278 Other lack of coordination: Secondary | ICD-10-CM | POA: Diagnosis not present

## 2023-01-31 DIAGNOSIS — I5181 Takotsubo syndrome: Secondary | ICD-10-CM | POA: Diagnosis not present

## 2023-01-31 DIAGNOSIS — I639 Cerebral infarction, unspecified: Secondary | ICD-10-CM | POA: Diagnosis not present

## 2023-01-31 DIAGNOSIS — I421 Obstructive hypertrophic cardiomyopathy: Secondary | ICD-10-CM | POA: Diagnosis not present

## 2023-01-31 DIAGNOSIS — R293 Abnormal posture: Secondary | ICD-10-CM | POA: Diagnosis not present

## 2023-01-31 DIAGNOSIS — R2689 Other abnormalities of gait and mobility: Secondary | ICD-10-CM | POA: Diagnosis not present

## 2023-01-31 DIAGNOSIS — M6281 Muscle weakness (generalized): Secondary | ICD-10-CM | POA: Diagnosis not present

## 2023-01-31 DIAGNOSIS — I25110 Atherosclerotic heart disease of native coronary artery with unstable angina pectoris: Secondary | ICD-10-CM | POA: Diagnosis not present

## 2023-01-31 DIAGNOSIS — R1312 Dysphagia, oropharyngeal phase: Secondary | ICD-10-CM | POA: Diagnosis not present

## 2023-02-02 DIAGNOSIS — R1312 Dysphagia, oropharyngeal phase: Secondary | ICD-10-CM | POA: Diagnosis not present

## 2023-02-02 DIAGNOSIS — I25110 Atherosclerotic heart disease of native coronary artery with unstable angina pectoris: Secondary | ICD-10-CM | POA: Diagnosis not present

## 2023-02-02 DIAGNOSIS — R2689 Other abnormalities of gait and mobility: Secondary | ICD-10-CM | POA: Diagnosis not present

## 2023-02-02 DIAGNOSIS — I421 Obstructive hypertrophic cardiomyopathy: Secondary | ICD-10-CM | POA: Diagnosis not present

## 2023-02-02 DIAGNOSIS — R293 Abnormal posture: Secondary | ICD-10-CM | POA: Diagnosis not present

## 2023-02-02 DIAGNOSIS — I5181 Takotsubo syndrome: Secondary | ICD-10-CM | POA: Diagnosis not present

## 2023-02-02 DIAGNOSIS — R278 Other lack of coordination: Secondary | ICD-10-CM | POA: Diagnosis not present

## 2023-02-02 DIAGNOSIS — I639 Cerebral infarction, unspecified: Secondary | ICD-10-CM | POA: Diagnosis not present

## 2023-02-02 DIAGNOSIS — M6281 Muscle weakness (generalized): Secondary | ICD-10-CM | POA: Diagnosis not present

## 2023-02-17 DIAGNOSIS — G8929 Other chronic pain: Secondary | ICD-10-CM | POA: Diagnosis not present

## 2023-02-23 DIAGNOSIS — I639 Cerebral infarction, unspecified: Secondary | ICD-10-CM | POA: Diagnosis not present

## 2023-02-23 DIAGNOSIS — R2689 Other abnormalities of gait and mobility: Secondary | ICD-10-CM | POA: Diagnosis not present

## 2023-02-23 DIAGNOSIS — I25110 Atherosclerotic heart disease of native coronary artery with unstable angina pectoris: Secondary | ICD-10-CM | POA: Diagnosis not present

## 2023-02-23 DIAGNOSIS — R293 Abnormal posture: Secondary | ICD-10-CM | POA: Diagnosis not present

## 2023-02-23 DIAGNOSIS — R278 Other lack of coordination: Secondary | ICD-10-CM | POA: Diagnosis not present

## 2023-02-23 DIAGNOSIS — M6281 Muscle weakness (generalized): Secondary | ICD-10-CM | POA: Diagnosis not present

## 2023-02-23 DIAGNOSIS — I421 Obstructive hypertrophic cardiomyopathy: Secondary | ICD-10-CM | POA: Diagnosis not present

## 2023-02-23 DIAGNOSIS — R1312 Dysphagia, oropharyngeal phase: Secondary | ICD-10-CM | POA: Diagnosis not present

## 2023-02-23 DIAGNOSIS — I5181 Takotsubo syndrome: Secondary | ICD-10-CM | POA: Diagnosis not present

## 2023-02-24 DIAGNOSIS — I10 Essential (primary) hypertension: Secondary | ICD-10-CM | POA: Diagnosis not present

## 2023-02-24 DIAGNOSIS — E119 Type 2 diabetes mellitus without complications: Secondary | ICD-10-CM | POA: Diagnosis not present

## 2023-02-24 DIAGNOSIS — D6489 Other specified anemias: Secondary | ICD-10-CM | POA: Diagnosis not present

## 2023-02-24 DIAGNOSIS — E211 Secondary hyperparathyroidism, not elsewhere classified: Secondary | ICD-10-CM | POA: Diagnosis not present

## 2023-02-24 DIAGNOSIS — E872 Acidosis, unspecified: Secondary | ICD-10-CM | POA: Diagnosis not present

## 2023-02-24 DIAGNOSIS — E559 Vitamin D deficiency, unspecified: Secondary | ICD-10-CM | POA: Diagnosis not present

## 2023-02-24 DIAGNOSIS — N189 Chronic kidney disease, unspecified: Secondary | ICD-10-CM | POA: Diagnosis not present

## 2023-02-24 DIAGNOSIS — N184 Chronic kidney disease, stage 4 (severe): Secondary | ICD-10-CM | POA: Diagnosis not present

## 2023-02-24 DIAGNOSIS — I421 Obstructive hypertrophic cardiomyopathy: Secondary | ICD-10-CM | POA: Diagnosis not present

## 2023-02-24 DIAGNOSIS — R55 Syncope and collapse: Secondary | ICD-10-CM | POA: Diagnosis not present

## 2023-02-25 DIAGNOSIS — M6281 Muscle weakness (generalized): Secondary | ICD-10-CM | POA: Diagnosis not present

## 2023-02-25 DIAGNOSIS — I5181 Takotsubo syndrome: Secondary | ICD-10-CM | POA: Diagnosis not present

## 2023-02-25 DIAGNOSIS — I421 Obstructive hypertrophic cardiomyopathy: Secondary | ICD-10-CM | POA: Diagnosis not present

## 2023-02-25 DIAGNOSIS — R1312 Dysphagia, oropharyngeal phase: Secondary | ICD-10-CM | POA: Diagnosis not present

## 2023-02-25 DIAGNOSIS — R2689 Other abnormalities of gait and mobility: Secondary | ICD-10-CM | POA: Diagnosis not present

## 2023-02-25 DIAGNOSIS — R278 Other lack of coordination: Secondary | ICD-10-CM | POA: Diagnosis not present

## 2023-02-25 DIAGNOSIS — R293 Abnormal posture: Secondary | ICD-10-CM | POA: Diagnosis not present

## 2023-02-25 DIAGNOSIS — I639 Cerebral infarction, unspecified: Secondary | ICD-10-CM | POA: Diagnosis not present

## 2023-02-25 DIAGNOSIS — I25110 Atherosclerotic heart disease of native coronary artery with unstable angina pectoris: Secondary | ICD-10-CM | POA: Diagnosis not present

## 2023-02-26 DIAGNOSIS — I421 Obstructive hypertrophic cardiomyopathy: Secondary | ICD-10-CM | POA: Diagnosis not present

## 2023-02-26 DIAGNOSIS — R2689 Other abnormalities of gait and mobility: Secondary | ICD-10-CM | POA: Diagnosis not present

## 2023-02-26 DIAGNOSIS — R1312 Dysphagia, oropharyngeal phase: Secondary | ICD-10-CM | POA: Diagnosis not present

## 2023-02-26 DIAGNOSIS — I5181 Takotsubo syndrome: Secondary | ICD-10-CM | POA: Diagnosis not present

## 2023-02-26 DIAGNOSIS — R278 Other lack of coordination: Secondary | ICD-10-CM | POA: Diagnosis not present

## 2023-02-26 DIAGNOSIS — I25110 Atherosclerotic heart disease of native coronary artery with unstable angina pectoris: Secondary | ICD-10-CM | POA: Diagnosis not present

## 2023-02-26 DIAGNOSIS — I639 Cerebral infarction, unspecified: Secondary | ICD-10-CM | POA: Diagnosis not present

## 2023-02-26 DIAGNOSIS — R293 Abnormal posture: Secondary | ICD-10-CM | POA: Diagnosis not present

## 2023-02-26 DIAGNOSIS — M6281 Muscle weakness (generalized): Secondary | ICD-10-CM | POA: Diagnosis not present

## 2023-02-27 DIAGNOSIS — I639 Cerebral infarction, unspecified: Secondary | ICD-10-CM | POA: Diagnosis not present

## 2023-02-27 DIAGNOSIS — I25110 Atherosclerotic heart disease of native coronary artery with unstable angina pectoris: Secondary | ICD-10-CM | POA: Diagnosis not present

## 2023-02-27 DIAGNOSIS — R2689 Other abnormalities of gait and mobility: Secondary | ICD-10-CM | POA: Diagnosis not present

## 2023-02-27 DIAGNOSIS — I421 Obstructive hypertrophic cardiomyopathy: Secondary | ICD-10-CM | POA: Diagnosis not present

## 2023-02-27 DIAGNOSIS — I5181 Takotsubo syndrome: Secondary | ICD-10-CM | POA: Diagnosis not present

## 2023-02-27 DIAGNOSIS — R278 Other lack of coordination: Secondary | ICD-10-CM | POA: Diagnosis not present

## 2023-02-27 DIAGNOSIS — R1312 Dysphagia, oropharyngeal phase: Secondary | ICD-10-CM | POA: Diagnosis not present

## 2023-02-27 DIAGNOSIS — R293 Abnormal posture: Secondary | ICD-10-CM | POA: Diagnosis not present

## 2023-02-27 DIAGNOSIS — M6281 Muscle weakness (generalized): Secondary | ICD-10-CM | POA: Diagnosis not present

## 2023-03-02 DIAGNOSIS — R1312 Dysphagia, oropharyngeal phase: Secondary | ICD-10-CM | POA: Diagnosis not present

## 2023-03-02 DIAGNOSIS — I639 Cerebral infarction, unspecified: Secondary | ICD-10-CM | POA: Diagnosis not present

## 2023-03-02 DIAGNOSIS — I421 Obstructive hypertrophic cardiomyopathy: Secondary | ICD-10-CM | POA: Diagnosis not present

## 2023-03-02 DIAGNOSIS — R293 Abnormal posture: Secondary | ICD-10-CM | POA: Diagnosis not present

## 2023-03-02 DIAGNOSIS — I25110 Atherosclerotic heart disease of native coronary artery with unstable angina pectoris: Secondary | ICD-10-CM | POA: Diagnosis not present

## 2023-03-02 DIAGNOSIS — R2689 Other abnormalities of gait and mobility: Secondary | ICD-10-CM | POA: Diagnosis not present

## 2023-03-02 DIAGNOSIS — M6281 Muscle weakness (generalized): Secondary | ICD-10-CM | POA: Diagnosis not present

## 2023-03-02 DIAGNOSIS — R278 Other lack of coordination: Secondary | ICD-10-CM | POA: Diagnosis not present

## 2023-03-02 DIAGNOSIS — I5181 Takotsubo syndrome: Secondary | ICD-10-CM | POA: Diagnosis not present

## 2023-03-03 DIAGNOSIS — R278 Other lack of coordination: Secondary | ICD-10-CM | POA: Diagnosis not present

## 2023-03-03 DIAGNOSIS — R2689 Other abnormalities of gait and mobility: Secondary | ICD-10-CM | POA: Diagnosis not present

## 2023-03-03 DIAGNOSIS — M6281 Muscle weakness (generalized): Secondary | ICD-10-CM | POA: Diagnosis not present

## 2023-03-03 DIAGNOSIS — I5181 Takotsubo syndrome: Secondary | ICD-10-CM | POA: Diagnosis not present

## 2023-03-03 DIAGNOSIS — N184 Chronic kidney disease, stage 4 (severe): Secondary | ICD-10-CM | POA: Diagnosis not present

## 2023-03-03 DIAGNOSIS — R293 Abnormal posture: Secondary | ICD-10-CM | POA: Diagnosis not present

## 2023-03-03 DIAGNOSIS — I421 Obstructive hypertrophic cardiomyopathy: Secondary | ICD-10-CM | POA: Diagnosis not present

## 2023-03-03 DIAGNOSIS — R1312 Dysphagia, oropharyngeal phase: Secondary | ICD-10-CM | POA: Diagnosis not present

## 2023-03-03 DIAGNOSIS — I25110 Atherosclerotic heart disease of native coronary artery with unstable angina pectoris: Secondary | ICD-10-CM | POA: Diagnosis not present

## 2023-03-03 DIAGNOSIS — I639 Cerebral infarction, unspecified: Secondary | ICD-10-CM | POA: Diagnosis not present

## 2023-03-04 DIAGNOSIS — I25110 Atherosclerotic heart disease of native coronary artery with unstable angina pectoris: Secondary | ICD-10-CM | POA: Diagnosis not present

## 2023-03-04 DIAGNOSIS — R293 Abnormal posture: Secondary | ICD-10-CM | POA: Diagnosis not present

## 2023-03-04 DIAGNOSIS — R278 Other lack of coordination: Secondary | ICD-10-CM | POA: Diagnosis not present

## 2023-03-04 DIAGNOSIS — M6281 Muscle weakness (generalized): Secondary | ICD-10-CM | POA: Diagnosis not present

## 2023-03-04 DIAGNOSIS — R2689 Other abnormalities of gait and mobility: Secondary | ICD-10-CM | POA: Diagnosis not present

## 2023-03-04 DIAGNOSIS — I639 Cerebral infarction, unspecified: Secondary | ICD-10-CM | POA: Diagnosis not present

## 2023-03-04 DIAGNOSIS — I421 Obstructive hypertrophic cardiomyopathy: Secondary | ICD-10-CM | POA: Diagnosis not present

## 2023-03-04 DIAGNOSIS — R1312 Dysphagia, oropharyngeal phase: Secondary | ICD-10-CM | POA: Diagnosis not present

## 2023-03-04 DIAGNOSIS — I5181 Takotsubo syndrome: Secondary | ICD-10-CM | POA: Diagnosis not present

## 2023-03-05 DIAGNOSIS — I5181 Takotsubo syndrome: Secondary | ICD-10-CM | POA: Diagnosis not present

## 2023-03-05 DIAGNOSIS — I25110 Atherosclerotic heart disease of native coronary artery with unstable angina pectoris: Secondary | ICD-10-CM | POA: Diagnosis not present

## 2023-03-05 DIAGNOSIS — R278 Other lack of coordination: Secondary | ICD-10-CM | POA: Diagnosis not present

## 2023-03-05 DIAGNOSIS — I639 Cerebral infarction, unspecified: Secondary | ICD-10-CM | POA: Diagnosis not present

## 2023-03-05 DIAGNOSIS — M6281 Muscle weakness (generalized): Secondary | ICD-10-CM | POA: Diagnosis not present

## 2023-03-05 DIAGNOSIS — R1312 Dysphagia, oropharyngeal phase: Secondary | ICD-10-CM | POA: Diagnosis not present

## 2023-03-05 DIAGNOSIS — I421 Obstructive hypertrophic cardiomyopathy: Secondary | ICD-10-CM | POA: Diagnosis not present

## 2023-03-05 DIAGNOSIS — R2689 Other abnormalities of gait and mobility: Secondary | ICD-10-CM | POA: Diagnosis not present

## 2023-03-05 DIAGNOSIS — R293 Abnormal posture: Secondary | ICD-10-CM | POA: Diagnosis not present

## 2023-03-06 DIAGNOSIS — K5900 Constipation, unspecified: Secondary | ICD-10-CM | POA: Diagnosis not present

## 2023-03-06 DIAGNOSIS — I69354 Hemiplegia and hemiparesis following cerebral infarction affecting left non-dominant side: Secondary | ICD-10-CM | POA: Diagnosis not present

## 2023-03-06 DIAGNOSIS — I2510 Atherosclerotic heart disease of native coronary artery without angina pectoris: Secondary | ICD-10-CM | POA: Diagnosis not present

## 2023-03-06 DIAGNOSIS — R278 Other lack of coordination: Secondary | ICD-10-CM | POA: Diagnosis not present

## 2023-03-06 DIAGNOSIS — E559 Vitamin D deficiency, unspecified: Secondary | ICD-10-CM | POA: Diagnosis not present

## 2023-03-06 DIAGNOSIS — K219 Gastro-esophageal reflux disease without esophagitis: Secondary | ICD-10-CM | POA: Diagnosis not present

## 2023-03-06 DIAGNOSIS — I129 Hypertensive chronic kidney disease with stage 1 through stage 4 chronic kidney disease, or unspecified chronic kidney disease: Secondary | ICD-10-CM | POA: Diagnosis not present

## 2023-03-06 DIAGNOSIS — I25110 Atherosclerotic heart disease of native coronary artery with unstable angina pectoris: Secondary | ICD-10-CM | POA: Diagnosis not present

## 2023-03-06 DIAGNOSIS — I5181 Takotsubo syndrome: Secondary | ICD-10-CM | POA: Diagnosis not present

## 2023-03-06 DIAGNOSIS — I252 Old myocardial infarction: Secondary | ICD-10-CM | POA: Diagnosis not present

## 2023-03-06 DIAGNOSIS — I421 Obstructive hypertrophic cardiomyopathy: Secondary | ICD-10-CM | POA: Diagnosis not present

## 2023-03-06 DIAGNOSIS — Z7902 Long term (current) use of antithrombotics/antiplatelets: Secondary | ICD-10-CM | POA: Diagnosis not present

## 2023-03-06 DIAGNOSIS — I639 Cerebral infarction, unspecified: Secondary | ICD-10-CM | POA: Diagnosis not present

## 2023-03-06 DIAGNOSIS — R1312 Dysphagia, oropharyngeal phase: Secondary | ICD-10-CM | POA: Diagnosis not present

## 2023-03-06 DIAGNOSIS — R293 Abnormal posture: Secondary | ICD-10-CM | POA: Diagnosis not present

## 2023-03-06 DIAGNOSIS — Z7982 Long term (current) use of aspirin: Secondary | ICD-10-CM | POA: Diagnosis not present

## 2023-03-06 DIAGNOSIS — N189 Chronic kidney disease, unspecified: Secondary | ICD-10-CM | POA: Diagnosis not present

## 2023-03-06 DIAGNOSIS — E1159 Type 2 diabetes mellitus with other circulatory complications: Secondary | ICD-10-CM | POA: Diagnosis not present

## 2023-03-06 DIAGNOSIS — R2689 Other abnormalities of gait and mobility: Secondary | ICD-10-CM | POA: Diagnosis not present

## 2023-03-06 DIAGNOSIS — M6281 Muscle weakness (generalized): Secondary | ICD-10-CM | POA: Diagnosis not present

## 2023-03-07 DIAGNOSIS — R293 Abnormal posture: Secondary | ICD-10-CM | POA: Diagnosis not present

## 2023-03-07 DIAGNOSIS — R278 Other lack of coordination: Secondary | ICD-10-CM | POA: Diagnosis not present

## 2023-03-07 DIAGNOSIS — I5181 Takotsubo syndrome: Secondary | ICD-10-CM | POA: Diagnosis not present

## 2023-03-07 DIAGNOSIS — R1312 Dysphagia, oropharyngeal phase: Secondary | ICD-10-CM | POA: Diagnosis not present

## 2023-03-07 DIAGNOSIS — I639 Cerebral infarction, unspecified: Secondary | ICD-10-CM | POA: Diagnosis not present

## 2023-03-07 DIAGNOSIS — M6281 Muscle weakness (generalized): Secondary | ICD-10-CM | POA: Diagnosis not present

## 2023-03-07 DIAGNOSIS — R2689 Other abnormalities of gait and mobility: Secondary | ICD-10-CM | POA: Diagnosis not present

## 2023-03-07 DIAGNOSIS — I421 Obstructive hypertrophic cardiomyopathy: Secondary | ICD-10-CM | POA: Diagnosis not present

## 2023-03-07 DIAGNOSIS — I25110 Atherosclerotic heart disease of native coronary artery with unstable angina pectoris: Secondary | ICD-10-CM | POA: Diagnosis not present

## 2023-03-09 DIAGNOSIS — M6281 Muscle weakness (generalized): Secondary | ICD-10-CM | POA: Diagnosis not present

## 2023-03-09 DIAGNOSIS — I639 Cerebral infarction, unspecified: Secondary | ICD-10-CM | POA: Diagnosis not present

## 2023-03-09 DIAGNOSIS — R278 Other lack of coordination: Secondary | ICD-10-CM | POA: Diagnosis not present

## 2023-03-09 DIAGNOSIS — R2689 Other abnormalities of gait and mobility: Secondary | ICD-10-CM | POA: Diagnosis not present

## 2023-03-09 DIAGNOSIS — R293 Abnormal posture: Secondary | ICD-10-CM | POA: Diagnosis not present

## 2023-03-09 DIAGNOSIS — R1312 Dysphagia, oropharyngeal phase: Secondary | ICD-10-CM | POA: Diagnosis not present

## 2023-03-09 DIAGNOSIS — I5181 Takotsubo syndrome: Secondary | ICD-10-CM | POA: Diagnosis not present

## 2023-03-09 DIAGNOSIS — I25110 Atherosclerotic heart disease of native coronary artery with unstable angina pectoris: Secondary | ICD-10-CM | POA: Diagnosis not present

## 2023-03-09 DIAGNOSIS — I421 Obstructive hypertrophic cardiomyopathy: Secondary | ICD-10-CM | POA: Diagnosis not present

## 2023-03-10 DIAGNOSIS — R278 Other lack of coordination: Secondary | ICD-10-CM | POA: Diagnosis not present

## 2023-03-10 DIAGNOSIS — I25110 Atherosclerotic heart disease of native coronary artery with unstable angina pectoris: Secondary | ICD-10-CM | POA: Diagnosis not present

## 2023-03-10 DIAGNOSIS — I639 Cerebral infarction, unspecified: Secondary | ICD-10-CM | POA: Diagnosis not present

## 2023-03-10 DIAGNOSIS — R2689 Other abnormalities of gait and mobility: Secondary | ICD-10-CM | POA: Diagnosis not present

## 2023-03-10 DIAGNOSIS — M6281 Muscle weakness (generalized): Secondary | ICD-10-CM | POA: Diagnosis not present

## 2023-03-10 DIAGNOSIS — I421 Obstructive hypertrophic cardiomyopathy: Secondary | ICD-10-CM | POA: Diagnosis not present

## 2023-03-10 DIAGNOSIS — I5181 Takotsubo syndrome: Secondary | ICD-10-CM | POA: Diagnosis not present

## 2023-03-10 DIAGNOSIS — R293 Abnormal posture: Secondary | ICD-10-CM | POA: Diagnosis not present

## 2023-03-10 DIAGNOSIS — R1312 Dysphagia, oropharyngeal phase: Secondary | ICD-10-CM | POA: Diagnosis not present

## 2023-03-11 DIAGNOSIS — I5181 Takotsubo syndrome: Secondary | ICD-10-CM | POA: Diagnosis not present

## 2023-03-11 DIAGNOSIS — I25110 Atherosclerotic heart disease of native coronary artery with unstable angina pectoris: Secondary | ICD-10-CM | POA: Diagnosis not present

## 2023-03-11 DIAGNOSIS — I421 Obstructive hypertrophic cardiomyopathy: Secondary | ICD-10-CM | POA: Diagnosis not present

## 2023-03-11 DIAGNOSIS — R293 Abnormal posture: Secondary | ICD-10-CM | POA: Diagnosis not present

## 2023-03-11 DIAGNOSIS — I639 Cerebral infarction, unspecified: Secondary | ICD-10-CM | POA: Diagnosis not present

## 2023-03-11 DIAGNOSIS — R278 Other lack of coordination: Secondary | ICD-10-CM | POA: Diagnosis not present

## 2023-03-11 DIAGNOSIS — R2689 Other abnormalities of gait and mobility: Secondary | ICD-10-CM | POA: Diagnosis not present

## 2023-03-11 DIAGNOSIS — R1312 Dysphagia, oropharyngeal phase: Secondary | ICD-10-CM | POA: Diagnosis not present

## 2023-03-11 DIAGNOSIS — M6281 Muscle weakness (generalized): Secondary | ICD-10-CM | POA: Diagnosis not present

## 2023-03-12 DIAGNOSIS — R1312 Dysphagia, oropharyngeal phase: Secondary | ICD-10-CM | POA: Diagnosis not present

## 2023-03-12 DIAGNOSIS — I5181 Takotsubo syndrome: Secondary | ICD-10-CM | POA: Diagnosis not present

## 2023-03-12 DIAGNOSIS — R293 Abnormal posture: Secondary | ICD-10-CM | POA: Diagnosis not present

## 2023-03-12 DIAGNOSIS — M6281 Muscle weakness (generalized): Secondary | ICD-10-CM | POA: Diagnosis not present

## 2023-03-12 DIAGNOSIS — R2689 Other abnormalities of gait and mobility: Secondary | ICD-10-CM | POA: Diagnosis not present

## 2023-03-12 DIAGNOSIS — I25110 Atherosclerotic heart disease of native coronary artery with unstable angina pectoris: Secondary | ICD-10-CM | POA: Diagnosis not present

## 2023-03-12 DIAGNOSIS — I639 Cerebral infarction, unspecified: Secondary | ICD-10-CM | POA: Diagnosis not present

## 2023-03-12 DIAGNOSIS — I421 Obstructive hypertrophic cardiomyopathy: Secondary | ICD-10-CM | POA: Diagnosis not present

## 2023-03-12 DIAGNOSIS — R278 Other lack of coordination: Secondary | ICD-10-CM | POA: Diagnosis not present

## 2023-03-13 DIAGNOSIS — I639 Cerebral infarction, unspecified: Secondary | ICD-10-CM | POA: Diagnosis not present

## 2023-03-13 DIAGNOSIS — M6281 Muscle weakness (generalized): Secondary | ICD-10-CM | POA: Diagnosis not present

## 2023-03-13 DIAGNOSIS — R2689 Other abnormalities of gait and mobility: Secondary | ICD-10-CM | POA: Diagnosis not present

## 2023-03-13 DIAGNOSIS — I421 Obstructive hypertrophic cardiomyopathy: Secondary | ICD-10-CM | POA: Diagnosis not present

## 2023-03-13 DIAGNOSIS — R278 Other lack of coordination: Secondary | ICD-10-CM | POA: Diagnosis not present

## 2023-03-13 DIAGNOSIS — R1312 Dysphagia, oropharyngeal phase: Secondary | ICD-10-CM | POA: Diagnosis not present

## 2023-03-13 DIAGNOSIS — I5181 Takotsubo syndrome: Secondary | ICD-10-CM | POA: Diagnosis not present

## 2023-03-13 DIAGNOSIS — I25110 Atherosclerotic heart disease of native coronary artery with unstable angina pectoris: Secondary | ICD-10-CM | POA: Diagnosis not present

## 2023-03-13 DIAGNOSIS — R293 Abnormal posture: Secondary | ICD-10-CM | POA: Diagnosis not present

## 2023-03-16 DIAGNOSIS — M6281 Muscle weakness (generalized): Secondary | ICD-10-CM | POA: Diagnosis not present

## 2023-03-16 DIAGNOSIS — R293 Abnormal posture: Secondary | ICD-10-CM | POA: Diagnosis not present

## 2023-03-16 DIAGNOSIS — I421 Obstructive hypertrophic cardiomyopathy: Secondary | ICD-10-CM | POA: Diagnosis not present

## 2023-03-16 DIAGNOSIS — R278 Other lack of coordination: Secondary | ICD-10-CM | POA: Diagnosis not present

## 2023-03-16 DIAGNOSIS — I25110 Atherosclerotic heart disease of native coronary artery with unstable angina pectoris: Secondary | ICD-10-CM | POA: Diagnosis not present

## 2023-03-16 DIAGNOSIS — I639 Cerebral infarction, unspecified: Secondary | ICD-10-CM | POA: Diagnosis not present

## 2023-03-16 DIAGNOSIS — I5181 Takotsubo syndrome: Secondary | ICD-10-CM | POA: Diagnosis not present

## 2023-03-16 DIAGNOSIS — R2689 Other abnormalities of gait and mobility: Secondary | ICD-10-CM | POA: Diagnosis not present

## 2023-03-16 DIAGNOSIS — R1312 Dysphagia, oropharyngeal phase: Secondary | ICD-10-CM | POA: Diagnosis not present

## 2023-03-17 DIAGNOSIS — R278 Other lack of coordination: Secondary | ICD-10-CM | POA: Diagnosis not present

## 2023-03-17 DIAGNOSIS — I5181 Takotsubo syndrome: Secondary | ICD-10-CM | POA: Diagnosis not present

## 2023-03-17 DIAGNOSIS — R1312 Dysphagia, oropharyngeal phase: Secondary | ICD-10-CM | POA: Diagnosis not present

## 2023-03-17 DIAGNOSIS — Z79899 Other long term (current) drug therapy: Secondary | ICD-10-CM | POA: Diagnosis not present

## 2023-03-17 DIAGNOSIS — M6281 Muscle weakness (generalized): Secondary | ICD-10-CM | POA: Diagnosis not present

## 2023-03-17 DIAGNOSIS — Z8673 Personal history of transient ischemic attack (TIA), and cerebral infarction without residual deficits: Secondary | ICD-10-CM | POA: Diagnosis not present

## 2023-03-17 DIAGNOSIS — I25110 Atherosclerotic heart disease of native coronary artery with unstable angina pectoris: Secondary | ICD-10-CM | POA: Diagnosis not present

## 2023-03-17 DIAGNOSIS — I639 Cerebral infarction, unspecified: Secondary | ICD-10-CM | POA: Diagnosis not present

## 2023-03-17 DIAGNOSIS — R293 Abnormal posture: Secondary | ICD-10-CM | POA: Diagnosis not present

## 2023-03-17 DIAGNOSIS — R2689 Other abnormalities of gait and mobility: Secondary | ICD-10-CM | POA: Diagnosis not present

## 2023-03-17 DIAGNOSIS — I421 Obstructive hypertrophic cardiomyopathy: Secondary | ICD-10-CM | POA: Diagnosis not present

## 2023-04-07 DIAGNOSIS — E559 Vitamin D deficiency, unspecified: Secondary | ICD-10-CM | POA: Diagnosis not present

## 2023-04-15 DIAGNOSIS — Z8673 Personal history of transient ischemic attack (TIA), and cerebral infarction without residual deficits: Secondary | ICD-10-CM | POA: Diagnosis not present

## 2023-04-17 DIAGNOSIS — N189 Chronic kidney disease, unspecified: Secondary | ICD-10-CM | POA: Diagnosis not present

## 2023-04-17 DIAGNOSIS — I69354 Hemiplegia and hemiparesis following cerebral infarction affecting left non-dominant side: Secondary | ICD-10-CM | POA: Diagnosis not present

## 2023-04-17 DIAGNOSIS — I129 Hypertensive chronic kidney disease with stage 1 through stage 4 chronic kidney disease, or unspecified chronic kidney disease: Secondary | ICD-10-CM | POA: Diagnosis not present

## 2023-04-17 DIAGNOSIS — Z7982 Long term (current) use of aspirin: Secondary | ICD-10-CM | POA: Diagnosis not present

## 2023-04-17 DIAGNOSIS — Z993 Dependence on wheelchair: Secondary | ICD-10-CM | POA: Diagnosis not present

## 2023-04-17 DIAGNOSIS — Z7902 Long term (current) use of antithrombotics/antiplatelets: Secondary | ICD-10-CM | POA: Diagnosis not present

## 2023-04-22 DIAGNOSIS — R1310 Dysphagia, unspecified: Secondary | ICD-10-CM | POA: Diagnosis not present

## 2023-04-22 DIAGNOSIS — I10 Essential (primary) hypertension: Secondary | ICD-10-CM | POA: Diagnosis not present

## 2023-04-22 DIAGNOSIS — K219 Gastro-esophageal reflux disease without esophagitis: Secondary | ICD-10-CM | POA: Diagnosis not present

## 2023-04-22 DIAGNOSIS — I69354 Hemiplegia and hemiparesis following cerebral infarction affecting left non-dominant side: Secondary | ICD-10-CM | POA: Diagnosis not present

## 2023-04-22 DIAGNOSIS — R0902 Hypoxemia: Secondary | ICD-10-CM | POA: Diagnosis not present

## 2023-04-24 DIAGNOSIS — R293 Abnormal posture: Secondary | ICD-10-CM | POA: Diagnosis not present

## 2023-04-24 DIAGNOSIS — I639 Cerebral infarction, unspecified: Secondary | ICD-10-CM | POA: Diagnosis not present

## 2023-04-24 DIAGNOSIS — R2689 Other abnormalities of gait and mobility: Secondary | ICD-10-CM | POA: Diagnosis not present

## 2023-04-24 DIAGNOSIS — R1310 Dysphagia, unspecified: Secondary | ICD-10-CM | POA: Diagnosis not present

## 2023-04-24 DIAGNOSIS — M6281 Muscle weakness (generalized): Secondary | ICD-10-CM | POA: Diagnosis not present

## 2023-04-24 DIAGNOSIS — I5181 Takotsubo syndrome: Secondary | ICD-10-CM | POA: Diagnosis not present

## 2023-04-24 DIAGNOSIS — R278 Other lack of coordination: Secondary | ICD-10-CM | POA: Diagnosis not present

## 2023-04-24 DIAGNOSIS — I25110 Atherosclerotic heart disease of native coronary artery with unstable angina pectoris: Secondary | ICD-10-CM | POA: Diagnosis not present

## 2023-04-24 DIAGNOSIS — I421 Obstructive hypertrophic cardiomyopathy: Secondary | ICD-10-CM | POA: Diagnosis not present

## 2023-04-27 DIAGNOSIS — R1310 Dysphagia, unspecified: Secondary | ICD-10-CM | POA: Diagnosis not present

## 2023-04-27 DIAGNOSIS — M6281 Muscle weakness (generalized): Secondary | ICD-10-CM | POA: Diagnosis not present

## 2023-04-27 DIAGNOSIS — I421 Obstructive hypertrophic cardiomyopathy: Secondary | ICD-10-CM | POA: Diagnosis not present

## 2023-04-27 DIAGNOSIS — R293 Abnormal posture: Secondary | ICD-10-CM | POA: Diagnosis not present

## 2023-04-27 DIAGNOSIS — R278 Other lack of coordination: Secondary | ICD-10-CM | POA: Diagnosis not present

## 2023-04-27 DIAGNOSIS — I639 Cerebral infarction, unspecified: Secondary | ICD-10-CM | POA: Diagnosis not present

## 2023-04-27 DIAGNOSIS — I25110 Atherosclerotic heart disease of native coronary artery with unstable angina pectoris: Secondary | ICD-10-CM | POA: Diagnosis not present

## 2023-04-27 DIAGNOSIS — R2689 Other abnormalities of gait and mobility: Secondary | ICD-10-CM | POA: Diagnosis not present

## 2023-04-27 DIAGNOSIS — I5181 Takotsubo syndrome: Secondary | ICD-10-CM | POA: Diagnosis not present

## 2023-04-28 DIAGNOSIS — I421 Obstructive hypertrophic cardiomyopathy: Secondary | ICD-10-CM | POA: Diagnosis not present

## 2023-04-28 DIAGNOSIS — M6281 Muscle weakness (generalized): Secondary | ICD-10-CM | POA: Diagnosis not present

## 2023-04-28 DIAGNOSIS — R293 Abnormal posture: Secondary | ICD-10-CM | POA: Diagnosis not present

## 2023-04-28 DIAGNOSIS — I25110 Atherosclerotic heart disease of native coronary artery with unstable angina pectoris: Secondary | ICD-10-CM | POA: Diagnosis not present

## 2023-04-28 DIAGNOSIS — I5181 Takotsubo syndrome: Secondary | ICD-10-CM | POA: Diagnosis not present

## 2023-04-28 DIAGNOSIS — R278 Other lack of coordination: Secondary | ICD-10-CM | POA: Diagnosis not present

## 2023-04-28 DIAGNOSIS — R2689 Other abnormalities of gait and mobility: Secondary | ICD-10-CM | POA: Diagnosis not present

## 2023-04-28 DIAGNOSIS — R1310 Dysphagia, unspecified: Secondary | ICD-10-CM | POA: Diagnosis not present

## 2023-04-28 DIAGNOSIS — I639 Cerebral infarction, unspecified: Secondary | ICD-10-CM | POA: Diagnosis not present

## 2023-04-29 DIAGNOSIS — R1310 Dysphagia, unspecified: Secondary | ICD-10-CM | POA: Diagnosis not present

## 2023-04-29 DIAGNOSIS — I5181 Takotsubo syndrome: Secondary | ICD-10-CM | POA: Diagnosis not present

## 2023-04-29 DIAGNOSIS — R2689 Other abnormalities of gait and mobility: Secondary | ICD-10-CM | POA: Diagnosis not present

## 2023-04-29 DIAGNOSIS — I421 Obstructive hypertrophic cardiomyopathy: Secondary | ICD-10-CM | POA: Diagnosis not present

## 2023-04-29 DIAGNOSIS — M6281 Muscle weakness (generalized): Secondary | ICD-10-CM | POA: Diagnosis not present

## 2023-04-29 DIAGNOSIS — I639 Cerebral infarction, unspecified: Secondary | ICD-10-CM | POA: Diagnosis not present

## 2023-04-29 DIAGNOSIS — R293 Abnormal posture: Secondary | ICD-10-CM | POA: Diagnosis not present

## 2023-04-29 DIAGNOSIS — R278 Other lack of coordination: Secondary | ICD-10-CM | POA: Diagnosis not present

## 2023-04-29 DIAGNOSIS — I25110 Atherosclerotic heart disease of native coronary artery with unstable angina pectoris: Secondary | ICD-10-CM | POA: Diagnosis not present

## 2023-04-30 DIAGNOSIS — I421 Obstructive hypertrophic cardiomyopathy: Secondary | ICD-10-CM | POA: Diagnosis not present

## 2023-04-30 DIAGNOSIS — I25110 Atherosclerotic heart disease of native coronary artery with unstable angina pectoris: Secondary | ICD-10-CM | POA: Diagnosis not present

## 2023-04-30 DIAGNOSIS — I5181 Takotsubo syndrome: Secondary | ICD-10-CM | POA: Diagnosis not present

## 2023-04-30 DIAGNOSIS — R293 Abnormal posture: Secondary | ICD-10-CM | POA: Diagnosis not present

## 2023-04-30 DIAGNOSIS — I639 Cerebral infarction, unspecified: Secondary | ICD-10-CM | POA: Diagnosis not present

## 2023-04-30 DIAGNOSIS — M6281 Muscle weakness (generalized): Secondary | ICD-10-CM | POA: Diagnosis not present

## 2023-04-30 DIAGNOSIS — R278 Other lack of coordination: Secondary | ICD-10-CM | POA: Diagnosis not present

## 2023-04-30 DIAGNOSIS — R1310 Dysphagia, unspecified: Secondary | ICD-10-CM | POA: Diagnosis not present

## 2023-04-30 DIAGNOSIS — R2689 Other abnormalities of gait and mobility: Secondary | ICD-10-CM | POA: Diagnosis not present

## 2023-05-01 DIAGNOSIS — R2689 Other abnormalities of gait and mobility: Secondary | ICD-10-CM | POA: Diagnosis not present

## 2023-05-01 DIAGNOSIS — I25110 Atherosclerotic heart disease of native coronary artery with unstable angina pectoris: Secondary | ICD-10-CM | POA: Diagnosis not present

## 2023-05-01 DIAGNOSIS — R278 Other lack of coordination: Secondary | ICD-10-CM | POA: Diagnosis not present

## 2023-05-01 DIAGNOSIS — I421 Obstructive hypertrophic cardiomyopathy: Secondary | ICD-10-CM | POA: Diagnosis not present

## 2023-05-01 DIAGNOSIS — R293 Abnormal posture: Secondary | ICD-10-CM | POA: Diagnosis not present

## 2023-05-01 DIAGNOSIS — I5181 Takotsubo syndrome: Secondary | ICD-10-CM | POA: Diagnosis not present

## 2023-05-01 DIAGNOSIS — M6281 Muscle weakness (generalized): Secondary | ICD-10-CM | POA: Diagnosis not present

## 2023-05-01 DIAGNOSIS — R1310 Dysphagia, unspecified: Secondary | ICD-10-CM | POA: Diagnosis not present

## 2023-05-01 DIAGNOSIS — I639 Cerebral infarction, unspecified: Secondary | ICD-10-CM | POA: Diagnosis not present

## 2023-05-03 DIAGNOSIS — R293 Abnormal posture: Secondary | ICD-10-CM | POA: Diagnosis not present

## 2023-05-03 DIAGNOSIS — R2689 Other abnormalities of gait and mobility: Secondary | ICD-10-CM | POA: Diagnosis not present

## 2023-05-03 DIAGNOSIS — I5181 Takotsubo syndrome: Secondary | ICD-10-CM | POA: Diagnosis not present

## 2023-05-03 DIAGNOSIS — R278 Other lack of coordination: Secondary | ICD-10-CM | POA: Diagnosis not present

## 2023-05-03 DIAGNOSIS — M6281 Muscle weakness (generalized): Secondary | ICD-10-CM | POA: Diagnosis not present

## 2023-05-03 DIAGNOSIS — R1310 Dysphagia, unspecified: Secondary | ICD-10-CM | POA: Diagnosis not present

## 2023-05-03 DIAGNOSIS — I421 Obstructive hypertrophic cardiomyopathy: Secondary | ICD-10-CM | POA: Diagnosis not present

## 2023-05-03 DIAGNOSIS — I639 Cerebral infarction, unspecified: Secondary | ICD-10-CM | POA: Diagnosis not present

## 2023-05-03 DIAGNOSIS — I25110 Atherosclerotic heart disease of native coronary artery with unstable angina pectoris: Secondary | ICD-10-CM | POA: Diagnosis not present

## 2023-05-05 DIAGNOSIS — I69314 Frontal lobe and executive function deficit following cerebral infarction: Secondary | ICD-10-CM | POA: Diagnosis not present

## 2023-05-05 DIAGNOSIS — R1310 Dysphagia, unspecified: Secondary | ICD-10-CM | POA: Diagnosis not present

## 2023-05-05 DIAGNOSIS — Z741 Need for assistance with personal care: Secondary | ICD-10-CM | POA: Diagnosis not present

## 2023-05-05 DIAGNOSIS — M6281 Muscle weakness (generalized): Secondary | ICD-10-CM | POA: Diagnosis not present

## 2023-05-05 DIAGNOSIS — U071 COVID-19: Secondary | ICD-10-CM | POA: Diagnosis not present

## 2023-05-05 DIAGNOSIS — I25110 Atherosclerotic heart disease of native coronary artery with unstable angina pectoris: Secondary | ICD-10-CM | POA: Diagnosis not present

## 2023-05-05 DIAGNOSIS — I69354 Hemiplegia and hemiparesis following cerebral infarction affecting left non-dominant side: Secondary | ICD-10-CM | POA: Diagnosis not present

## 2023-05-06 DIAGNOSIS — I25110 Atherosclerotic heart disease of native coronary artery with unstable angina pectoris: Secondary | ICD-10-CM | POA: Diagnosis not present

## 2023-05-06 DIAGNOSIS — I69354 Hemiplegia and hemiparesis following cerebral infarction affecting left non-dominant side: Secondary | ICD-10-CM | POA: Diagnosis not present

## 2023-05-06 DIAGNOSIS — U071 COVID-19: Secondary | ICD-10-CM | POA: Diagnosis not present

## 2023-05-06 DIAGNOSIS — R1310 Dysphagia, unspecified: Secondary | ICD-10-CM | POA: Diagnosis not present

## 2023-05-06 DIAGNOSIS — M6281 Muscle weakness (generalized): Secondary | ICD-10-CM | POA: Diagnosis not present

## 2023-05-06 DIAGNOSIS — Z741 Need for assistance with personal care: Secondary | ICD-10-CM | POA: Diagnosis not present

## 2023-05-06 DIAGNOSIS — I69314 Frontal lobe and executive function deficit following cerebral infarction: Secondary | ICD-10-CM | POA: Diagnosis not present

## 2023-05-07 DIAGNOSIS — I69314 Frontal lobe and executive function deficit following cerebral infarction: Secondary | ICD-10-CM | POA: Diagnosis not present

## 2023-05-07 DIAGNOSIS — M6281 Muscle weakness (generalized): Secondary | ICD-10-CM | POA: Diagnosis not present

## 2023-05-07 DIAGNOSIS — R1310 Dysphagia, unspecified: Secondary | ICD-10-CM | POA: Diagnosis not present

## 2023-05-07 DIAGNOSIS — I25110 Atherosclerotic heart disease of native coronary artery with unstable angina pectoris: Secondary | ICD-10-CM | POA: Diagnosis not present

## 2023-05-07 DIAGNOSIS — U071 COVID-19: Secondary | ICD-10-CM | POA: Diagnosis not present

## 2023-05-07 DIAGNOSIS — I69354 Hemiplegia and hemiparesis following cerebral infarction affecting left non-dominant side: Secondary | ICD-10-CM | POA: Diagnosis not present

## 2023-05-07 DIAGNOSIS — Z741 Need for assistance with personal care: Secondary | ICD-10-CM | POA: Diagnosis not present

## 2023-05-08 DIAGNOSIS — I69354 Hemiplegia and hemiparesis following cerebral infarction affecting left non-dominant side: Secondary | ICD-10-CM | POA: Diagnosis not present

## 2023-05-08 DIAGNOSIS — Z741 Need for assistance with personal care: Secondary | ICD-10-CM | POA: Diagnosis not present

## 2023-05-08 DIAGNOSIS — M6281 Muscle weakness (generalized): Secondary | ICD-10-CM | POA: Diagnosis not present

## 2023-05-08 DIAGNOSIS — U071 COVID-19: Secondary | ICD-10-CM | POA: Diagnosis not present

## 2023-05-08 DIAGNOSIS — I25110 Atherosclerotic heart disease of native coronary artery with unstable angina pectoris: Secondary | ICD-10-CM | POA: Diagnosis not present

## 2023-05-08 DIAGNOSIS — R1310 Dysphagia, unspecified: Secondary | ICD-10-CM | POA: Diagnosis not present

## 2023-05-08 DIAGNOSIS — I69314 Frontal lobe and executive function deficit following cerebral infarction: Secondary | ICD-10-CM | POA: Diagnosis not present

## 2023-05-10 DIAGNOSIS — Z741 Need for assistance with personal care: Secondary | ICD-10-CM | POA: Diagnosis not present

## 2023-05-10 DIAGNOSIS — I69354 Hemiplegia and hemiparesis following cerebral infarction affecting left non-dominant side: Secondary | ICD-10-CM | POA: Diagnosis not present

## 2023-05-10 DIAGNOSIS — I69314 Frontal lobe and executive function deficit following cerebral infarction: Secondary | ICD-10-CM | POA: Diagnosis not present

## 2023-05-10 DIAGNOSIS — M6281 Muscle weakness (generalized): Secondary | ICD-10-CM | POA: Diagnosis not present

## 2023-05-10 DIAGNOSIS — U071 COVID-19: Secondary | ICD-10-CM | POA: Diagnosis not present

## 2023-05-10 DIAGNOSIS — R1310 Dysphagia, unspecified: Secondary | ICD-10-CM | POA: Diagnosis not present

## 2023-05-10 DIAGNOSIS — I25110 Atherosclerotic heart disease of native coronary artery with unstable angina pectoris: Secondary | ICD-10-CM | POA: Diagnosis not present

## 2023-05-11 DIAGNOSIS — I252 Old myocardial infarction: Secondary | ICD-10-CM | POA: Diagnosis not present

## 2023-05-11 DIAGNOSIS — E559 Vitamin D deficiency, unspecified: Secondary | ICD-10-CM | POA: Diagnosis not present

## 2023-05-11 DIAGNOSIS — Z7902 Long term (current) use of antithrombotics/antiplatelets: Secondary | ICD-10-CM | POA: Diagnosis not present

## 2023-05-11 DIAGNOSIS — E1122 Type 2 diabetes mellitus with diabetic chronic kidney disease: Secondary | ICD-10-CM | POA: Diagnosis not present

## 2023-05-11 DIAGNOSIS — E1159 Type 2 diabetes mellitus with other circulatory complications: Secondary | ICD-10-CM | POA: Diagnosis not present

## 2023-05-11 DIAGNOSIS — I5181 Takotsubo syndrome: Secondary | ICD-10-CM | POA: Diagnosis not present

## 2023-05-11 DIAGNOSIS — I429 Cardiomyopathy, unspecified: Secondary | ICD-10-CM | POA: Diagnosis not present

## 2023-05-11 DIAGNOSIS — I25110 Atherosclerotic heart disease of native coronary artery with unstable angina pectoris: Secondary | ICD-10-CM | POA: Diagnosis not present

## 2023-05-11 DIAGNOSIS — K219 Gastro-esophageal reflux disease without esophagitis: Secondary | ICD-10-CM | POA: Diagnosis not present

## 2023-05-11 DIAGNOSIS — I129 Hypertensive chronic kidney disease with stage 1 through stage 4 chronic kidney disease, or unspecified chronic kidney disease: Secondary | ICD-10-CM | POA: Diagnosis not present

## 2023-05-11 DIAGNOSIS — Z741 Need for assistance with personal care: Secondary | ICD-10-CM | POA: Diagnosis not present

## 2023-05-11 DIAGNOSIS — K5900 Constipation, unspecified: Secondary | ICD-10-CM | POA: Diagnosis not present

## 2023-05-11 DIAGNOSIS — U071 COVID-19: Secondary | ICD-10-CM | POA: Diagnosis not present

## 2023-05-11 DIAGNOSIS — I422 Other hypertrophic cardiomyopathy: Secondary | ICD-10-CM | POA: Diagnosis not present

## 2023-05-11 DIAGNOSIS — I2510 Atherosclerotic heart disease of native coronary artery without angina pectoris: Secondary | ICD-10-CM | POA: Diagnosis not present

## 2023-05-11 DIAGNOSIS — I69314 Frontal lobe and executive function deficit following cerebral infarction: Secondary | ICD-10-CM | POA: Diagnosis not present

## 2023-05-11 DIAGNOSIS — N184 Chronic kidney disease, stage 4 (severe): Secondary | ICD-10-CM | POA: Diagnosis not present

## 2023-05-11 DIAGNOSIS — Z993 Dependence on wheelchair: Secondary | ICD-10-CM | POA: Diagnosis not present

## 2023-05-11 DIAGNOSIS — M1990 Unspecified osteoarthritis, unspecified site: Secondary | ICD-10-CM | POA: Diagnosis not present

## 2023-05-11 DIAGNOSIS — J309 Allergic rhinitis, unspecified: Secondary | ICD-10-CM | POA: Diagnosis not present

## 2023-05-11 DIAGNOSIS — R1310 Dysphagia, unspecified: Secondary | ICD-10-CM | POA: Diagnosis not present

## 2023-05-11 DIAGNOSIS — M6281 Muscle weakness (generalized): Secondary | ICD-10-CM | POA: Diagnosis not present

## 2023-05-11 DIAGNOSIS — I69354 Hemiplegia and hemiparesis following cerebral infarction affecting left non-dominant side: Secondary | ICD-10-CM | POA: Diagnosis not present

## 2023-05-11 DIAGNOSIS — R42 Dizziness and giddiness: Secondary | ICD-10-CM | POA: Diagnosis not present

## 2023-05-11 DIAGNOSIS — Z7982 Long term (current) use of aspirin: Secondary | ICD-10-CM | POA: Diagnosis not present

## 2023-05-13 DIAGNOSIS — I25110 Atherosclerotic heart disease of native coronary artery with unstable angina pectoris: Secondary | ICD-10-CM | POA: Diagnosis not present

## 2023-05-13 DIAGNOSIS — I69314 Frontal lobe and executive function deficit following cerebral infarction: Secondary | ICD-10-CM | POA: Diagnosis not present

## 2023-05-13 DIAGNOSIS — M6281 Muscle weakness (generalized): Secondary | ICD-10-CM | POA: Diagnosis not present

## 2023-05-13 DIAGNOSIS — Z741 Need for assistance with personal care: Secondary | ICD-10-CM | POA: Diagnosis not present

## 2023-05-13 DIAGNOSIS — I69354 Hemiplegia and hemiparesis following cerebral infarction affecting left non-dominant side: Secondary | ICD-10-CM | POA: Diagnosis not present

## 2023-05-13 DIAGNOSIS — R1310 Dysphagia, unspecified: Secondary | ICD-10-CM | POA: Diagnosis not present

## 2023-05-13 DIAGNOSIS — U071 COVID-19: Secondary | ICD-10-CM | POA: Diagnosis not present

## 2023-05-14 DIAGNOSIS — I69314 Frontal lobe and executive function deficit following cerebral infarction: Secondary | ICD-10-CM | POA: Diagnosis not present

## 2023-05-14 DIAGNOSIS — R1310 Dysphagia, unspecified: Secondary | ICD-10-CM | POA: Diagnosis not present

## 2023-05-14 DIAGNOSIS — M6281 Muscle weakness (generalized): Secondary | ICD-10-CM | POA: Diagnosis not present

## 2023-05-14 DIAGNOSIS — I25110 Atherosclerotic heart disease of native coronary artery with unstable angina pectoris: Secondary | ICD-10-CM | POA: Diagnosis not present

## 2023-05-14 DIAGNOSIS — I69354 Hemiplegia and hemiparesis following cerebral infarction affecting left non-dominant side: Secondary | ICD-10-CM | POA: Diagnosis not present

## 2023-05-14 DIAGNOSIS — Z741 Need for assistance with personal care: Secondary | ICD-10-CM | POA: Diagnosis not present

## 2023-05-14 DIAGNOSIS — U071 COVID-19: Secondary | ICD-10-CM | POA: Diagnosis not present

## 2023-05-15 DIAGNOSIS — I25110 Atherosclerotic heart disease of native coronary artery with unstable angina pectoris: Secondary | ICD-10-CM | POA: Diagnosis not present

## 2023-05-15 DIAGNOSIS — M6281 Muscle weakness (generalized): Secondary | ICD-10-CM | POA: Diagnosis not present

## 2023-05-15 DIAGNOSIS — U071 COVID-19: Secondary | ICD-10-CM | POA: Diagnosis not present

## 2023-05-15 DIAGNOSIS — Z741 Need for assistance with personal care: Secondary | ICD-10-CM | POA: Diagnosis not present

## 2023-05-15 DIAGNOSIS — I69354 Hemiplegia and hemiparesis following cerebral infarction affecting left non-dominant side: Secondary | ICD-10-CM | POA: Diagnosis not present

## 2023-05-15 DIAGNOSIS — R1310 Dysphagia, unspecified: Secondary | ICD-10-CM | POA: Diagnosis not present

## 2023-05-15 DIAGNOSIS — I69314 Frontal lobe and executive function deficit following cerebral infarction: Secondary | ICD-10-CM | POA: Diagnosis not present

## 2023-05-17 DIAGNOSIS — I69354 Hemiplegia and hemiparesis following cerebral infarction affecting left non-dominant side: Secondary | ICD-10-CM | POA: Diagnosis not present

## 2023-05-17 DIAGNOSIS — U071 COVID-19: Secondary | ICD-10-CM | POA: Diagnosis not present

## 2023-05-17 DIAGNOSIS — I25110 Atherosclerotic heart disease of native coronary artery with unstable angina pectoris: Secondary | ICD-10-CM | POA: Diagnosis not present

## 2023-05-17 DIAGNOSIS — Z741 Need for assistance with personal care: Secondary | ICD-10-CM | POA: Diagnosis not present

## 2023-05-17 DIAGNOSIS — M6281 Muscle weakness (generalized): Secondary | ICD-10-CM | POA: Diagnosis not present

## 2023-05-17 DIAGNOSIS — R1310 Dysphagia, unspecified: Secondary | ICD-10-CM | POA: Diagnosis not present

## 2023-05-17 DIAGNOSIS — I69314 Frontal lobe and executive function deficit following cerebral infarction: Secondary | ICD-10-CM | POA: Diagnosis not present

## 2023-05-18 DIAGNOSIS — I69354 Hemiplegia and hemiparesis following cerebral infarction affecting left non-dominant side: Secondary | ICD-10-CM | POA: Diagnosis not present

## 2023-05-18 DIAGNOSIS — U071 COVID-19: Secondary | ICD-10-CM | POA: Diagnosis not present

## 2023-05-18 DIAGNOSIS — Z741 Need for assistance with personal care: Secondary | ICD-10-CM | POA: Diagnosis not present

## 2023-05-18 DIAGNOSIS — R1310 Dysphagia, unspecified: Secondary | ICD-10-CM | POA: Diagnosis not present

## 2023-05-18 DIAGNOSIS — I69314 Frontal lobe and executive function deficit following cerebral infarction: Secondary | ICD-10-CM | POA: Diagnosis not present

## 2023-05-18 DIAGNOSIS — M6281 Muscle weakness (generalized): Secondary | ICD-10-CM | POA: Diagnosis not present

## 2023-05-18 DIAGNOSIS — I25110 Atherosclerotic heart disease of native coronary artery with unstable angina pectoris: Secondary | ICD-10-CM | POA: Diagnosis not present

## 2023-05-20 DIAGNOSIS — U071 COVID-19: Secondary | ICD-10-CM | POA: Diagnosis not present

## 2023-05-20 DIAGNOSIS — I69354 Hemiplegia and hemiparesis following cerebral infarction affecting left non-dominant side: Secondary | ICD-10-CM | POA: Diagnosis not present

## 2023-05-20 DIAGNOSIS — Z741 Need for assistance with personal care: Secondary | ICD-10-CM | POA: Diagnosis not present

## 2023-05-20 DIAGNOSIS — I25110 Atherosclerotic heart disease of native coronary artery with unstable angina pectoris: Secondary | ICD-10-CM | POA: Diagnosis not present

## 2023-05-20 DIAGNOSIS — R1310 Dysphagia, unspecified: Secondary | ICD-10-CM | POA: Diagnosis not present

## 2023-05-20 DIAGNOSIS — I69314 Frontal lobe and executive function deficit following cerebral infarction: Secondary | ICD-10-CM | POA: Diagnosis not present

## 2023-05-20 DIAGNOSIS — M6281 Muscle weakness (generalized): Secondary | ICD-10-CM | POA: Diagnosis not present

## 2023-05-21 DIAGNOSIS — I69354 Hemiplegia and hemiparesis following cerebral infarction affecting left non-dominant side: Secondary | ICD-10-CM | POA: Diagnosis not present

## 2023-05-21 DIAGNOSIS — R1310 Dysphagia, unspecified: Secondary | ICD-10-CM | POA: Diagnosis not present

## 2023-05-21 DIAGNOSIS — U071 COVID-19: Secondary | ICD-10-CM | POA: Diagnosis not present

## 2023-05-21 DIAGNOSIS — M6281 Muscle weakness (generalized): Secondary | ICD-10-CM | POA: Diagnosis not present

## 2023-05-21 DIAGNOSIS — I69314 Frontal lobe and executive function deficit following cerebral infarction: Secondary | ICD-10-CM | POA: Diagnosis not present

## 2023-05-21 DIAGNOSIS — Z741 Need for assistance with personal care: Secondary | ICD-10-CM | POA: Diagnosis not present

## 2023-05-21 DIAGNOSIS — I25110 Atherosclerotic heart disease of native coronary artery with unstable angina pectoris: Secondary | ICD-10-CM | POA: Diagnosis not present

## 2023-05-28 DIAGNOSIS — R1310 Dysphagia, unspecified: Secondary | ICD-10-CM | POA: Diagnosis not present

## 2023-05-28 DIAGNOSIS — Z741 Need for assistance with personal care: Secondary | ICD-10-CM | POA: Diagnosis not present

## 2023-05-28 DIAGNOSIS — M6281 Muscle weakness (generalized): Secondary | ICD-10-CM | POA: Diagnosis not present

## 2023-05-28 DIAGNOSIS — I25110 Atherosclerotic heart disease of native coronary artery with unstable angina pectoris: Secondary | ICD-10-CM | POA: Diagnosis not present

## 2023-05-28 DIAGNOSIS — U071 COVID-19: Secondary | ICD-10-CM | POA: Diagnosis not present

## 2023-05-28 DIAGNOSIS — I69314 Frontal lobe and executive function deficit following cerebral infarction: Secondary | ICD-10-CM | POA: Diagnosis not present

## 2023-05-28 DIAGNOSIS — I69354 Hemiplegia and hemiparesis following cerebral infarction affecting left non-dominant side: Secondary | ICD-10-CM | POA: Diagnosis not present

## 2023-05-29 DIAGNOSIS — R1310 Dysphagia, unspecified: Secondary | ICD-10-CM | POA: Diagnosis not present

## 2023-05-29 DIAGNOSIS — Z741 Need for assistance with personal care: Secondary | ICD-10-CM | POA: Diagnosis not present

## 2023-05-29 DIAGNOSIS — U071 COVID-19: Secondary | ICD-10-CM | POA: Diagnosis not present

## 2023-05-29 DIAGNOSIS — I25110 Atherosclerotic heart disease of native coronary artery with unstable angina pectoris: Secondary | ICD-10-CM | POA: Diagnosis not present

## 2023-05-29 DIAGNOSIS — I69354 Hemiplegia and hemiparesis following cerebral infarction affecting left non-dominant side: Secondary | ICD-10-CM | POA: Diagnosis not present

## 2023-05-29 DIAGNOSIS — M6281 Muscle weakness (generalized): Secondary | ICD-10-CM | POA: Diagnosis not present

## 2023-05-29 DIAGNOSIS — I69314 Frontal lobe and executive function deficit following cerebral infarction: Secondary | ICD-10-CM | POA: Diagnosis not present

## 2023-06-01 DIAGNOSIS — I69314 Frontal lobe and executive function deficit following cerebral infarction: Secondary | ICD-10-CM | POA: Diagnosis not present

## 2023-06-01 DIAGNOSIS — U071 COVID-19: Secondary | ICD-10-CM | POA: Diagnosis not present

## 2023-06-01 DIAGNOSIS — I69354 Hemiplegia and hemiparesis following cerebral infarction affecting left non-dominant side: Secondary | ICD-10-CM | POA: Diagnosis not present

## 2023-06-01 DIAGNOSIS — R1310 Dysphagia, unspecified: Secondary | ICD-10-CM | POA: Diagnosis not present

## 2023-06-01 DIAGNOSIS — Z741 Need for assistance with personal care: Secondary | ICD-10-CM | POA: Diagnosis not present

## 2023-06-01 DIAGNOSIS — I25110 Atherosclerotic heart disease of native coronary artery with unstable angina pectoris: Secondary | ICD-10-CM | POA: Diagnosis not present

## 2023-06-01 DIAGNOSIS — M6281 Muscle weakness (generalized): Secondary | ICD-10-CM | POA: Diagnosis not present

## 2023-06-02 DIAGNOSIS — U071 COVID-19: Secondary | ICD-10-CM | POA: Diagnosis not present

## 2023-06-02 DIAGNOSIS — I69354 Hemiplegia and hemiparesis following cerebral infarction affecting left non-dominant side: Secondary | ICD-10-CM | POA: Diagnosis not present

## 2023-06-02 DIAGNOSIS — I25110 Atherosclerotic heart disease of native coronary artery with unstable angina pectoris: Secondary | ICD-10-CM | POA: Diagnosis not present

## 2023-06-02 DIAGNOSIS — Z741 Need for assistance with personal care: Secondary | ICD-10-CM | POA: Diagnosis not present

## 2023-06-02 DIAGNOSIS — R1310 Dysphagia, unspecified: Secondary | ICD-10-CM | POA: Diagnosis not present

## 2023-06-02 DIAGNOSIS — I69314 Frontal lobe and executive function deficit following cerebral infarction: Secondary | ICD-10-CM | POA: Diagnosis not present

## 2023-06-02 DIAGNOSIS — M6281 Muscle weakness (generalized): Secondary | ICD-10-CM | POA: Diagnosis not present

## 2023-06-03 DIAGNOSIS — Z741 Need for assistance with personal care: Secondary | ICD-10-CM | POA: Diagnosis not present

## 2023-06-03 DIAGNOSIS — I69314 Frontal lobe and executive function deficit following cerebral infarction: Secondary | ICD-10-CM | POA: Diagnosis not present

## 2023-06-03 DIAGNOSIS — I25110 Atherosclerotic heart disease of native coronary artery with unstable angina pectoris: Secondary | ICD-10-CM | POA: Diagnosis not present

## 2023-06-03 DIAGNOSIS — I69354 Hemiplegia and hemiparesis following cerebral infarction affecting left non-dominant side: Secondary | ICD-10-CM | POA: Diagnosis not present

## 2023-06-03 DIAGNOSIS — M6281 Muscle weakness (generalized): Secondary | ICD-10-CM | POA: Diagnosis not present

## 2023-06-03 DIAGNOSIS — R1310 Dysphagia, unspecified: Secondary | ICD-10-CM | POA: Diagnosis not present

## 2023-06-03 DIAGNOSIS — U071 COVID-19: Secondary | ICD-10-CM | POA: Diagnosis not present

## 2023-06-04 DIAGNOSIS — R1310 Dysphagia, unspecified: Secondary | ICD-10-CM | POA: Diagnosis not present

## 2023-06-04 DIAGNOSIS — Z741 Need for assistance with personal care: Secondary | ICD-10-CM | POA: Diagnosis not present

## 2023-06-04 DIAGNOSIS — U071 COVID-19: Secondary | ICD-10-CM | POA: Diagnosis not present

## 2023-06-04 DIAGNOSIS — I69314 Frontal lobe and executive function deficit following cerebral infarction: Secondary | ICD-10-CM | POA: Diagnosis not present

## 2023-06-04 DIAGNOSIS — I25110 Atherosclerotic heart disease of native coronary artery with unstable angina pectoris: Secondary | ICD-10-CM | POA: Diagnosis not present

## 2023-06-04 DIAGNOSIS — M6281 Muscle weakness (generalized): Secondary | ICD-10-CM | POA: Diagnosis not present

## 2023-06-04 DIAGNOSIS — I69354 Hemiplegia and hemiparesis following cerebral infarction affecting left non-dominant side: Secondary | ICD-10-CM | POA: Diagnosis not present

## 2023-06-05 DIAGNOSIS — I69354 Hemiplegia and hemiparesis following cerebral infarction affecting left non-dominant side: Secondary | ICD-10-CM | POA: Diagnosis not present

## 2023-06-05 DIAGNOSIS — I25110 Atherosclerotic heart disease of native coronary artery with unstable angina pectoris: Secondary | ICD-10-CM | POA: Diagnosis not present

## 2023-06-05 DIAGNOSIS — I69314 Frontal lobe and executive function deficit following cerebral infarction: Secondary | ICD-10-CM | POA: Diagnosis not present

## 2023-06-05 DIAGNOSIS — Z741 Need for assistance with personal care: Secondary | ICD-10-CM | POA: Diagnosis not present

## 2023-06-05 DIAGNOSIS — U071 COVID-19: Secondary | ICD-10-CM | POA: Diagnosis not present

## 2023-06-05 DIAGNOSIS — R1310 Dysphagia, unspecified: Secondary | ICD-10-CM | POA: Diagnosis not present

## 2023-06-05 DIAGNOSIS — M6281 Muscle weakness (generalized): Secondary | ICD-10-CM | POA: Diagnosis not present

## 2023-06-08 DIAGNOSIS — R1310 Dysphagia, unspecified: Secondary | ICD-10-CM | POA: Diagnosis not present

## 2023-06-08 DIAGNOSIS — I69314 Frontal lobe and executive function deficit following cerebral infarction: Secondary | ICD-10-CM | POA: Diagnosis not present

## 2023-06-08 DIAGNOSIS — I69354 Hemiplegia and hemiparesis following cerebral infarction affecting left non-dominant side: Secondary | ICD-10-CM | POA: Diagnosis not present

## 2023-06-08 DIAGNOSIS — M6281 Muscle weakness (generalized): Secondary | ICD-10-CM | POA: Diagnosis not present

## 2023-06-08 DIAGNOSIS — U071 COVID-19: Secondary | ICD-10-CM | POA: Diagnosis not present

## 2023-06-08 DIAGNOSIS — I25110 Atherosclerotic heart disease of native coronary artery with unstable angina pectoris: Secondary | ICD-10-CM | POA: Diagnosis not present

## 2023-06-08 DIAGNOSIS — Z741 Need for assistance with personal care: Secondary | ICD-10-CM | POA: Diagnosis not present

## 2023-06-09 DIAGNOSIS — Z741 Need for assistance with personal care: Secondary | ICD-10-CM | POA: Diagnosis not present

## 2023-06-09 DIAGNOSIS — I25110 Atherosclerotic heart disease of native coronary artery with unstable angina pectoris: Secondary | ICD-10-CM | POA: Diagnosis not present

## 2023-06-09 DIAGNOSIS — I69314 Frontal lobe and executive function deficit following cerebral infarction: Secondary | ICD-10-CM | POA: Diagnosis not present

## 2023-06-09 DIAGNOSIS — M6281 Muscle weakness (generalized): Secondary | ICD-10-CM | POA: Diagnosis not present

## 2023-06-09 DIAGNOSIS — I69354 Hemiplegia and hemiparesis following cerebral infarction affecting left non-dominant side: Secondary | ICD-10-CM | POA: Diagnosis not present

## 2023-06-09 DIAGNOSIS — U071 COVID-19: Secondary | ICD-10-CM | POA: Diagnosis not present

## 2023-06-09 DIAGNOSIS — R1310 Dysphagia, unspecified: Secondary | ICD-10-CM | POA: Diagnosis not present

## 2023-06-10 DIAGNOSIS — I69314 Frontal lobe and executive function deficit following cerebral infarction: Secondary | ICD-10-CM | POA: Diagnosis not present

## 2023-06-10 DIAGNOSIS — Z741 Need for assistance with personal care: Secondary | ICD-10-CM | POA: Diagnosis not present

## 2023-06-10 DIAGNOSIS — I69354 Hemiplegia and hemiparesis following cerebral infarction affecting left non-dominant side: Secondary | ICD-10-CM | POA: Diagnosis not present

## 2023-06-10 DIAGNOSIS — I25110 Atherosclerotic heart disease of native coronary artery with unstable angina pectoris: Secondary | ICD-10-CM | POA: Diagnosis not present

## 2023-06-10 DIAGNOSIS — R1310 Dysphagia, unspecified: Secondary | ICD-10-CM | POA: Diagnosis not present

## 2023-06-10 DIAGNOSIS — M6281 Muscle weakness (generalized): Secondary | ICD-10-CM | POA: Diagnosis not present

## 2023-06-10 DIAGNOSIS — U071 COVID-19: Secondary | ICD-10-CM | POA: Diagnosis not present

## 2023-06-11 DIAGNOSIS — U071 COVID-19: Secondary | ICD-10-CM | POA: Diagnosis not present

## 2023-06-11 DIAGNOSIS — I25110 Atherosclerotic heart disease of native coronary artery with unstable angina pectoris: Secondary | ICD-10-CM | POA: Diagnosis not present

## 2023-06-11 DIAGNOSIS — I69314 Frontal lobe and executive function deficit following cerebral infarction: Secondary | ICD-10-CM | POA: Diagnosis not present

## 2023-06-11 DIAGNOSIS — I69354 Hemiplegia and hemiparesis following cerebral infarction affecting left non-dominant side: Secondary | ICD-10-CM | POA: Diagnosis not present

## 2023-06-11 DIAGNOSIS — M6281 Muscle weakness (generalized): Secondary | ICD-10-CM | POA: Diagnosis not present

## 2023-06-11 DIAGNOSIS — R1310 Dysphagia, unspecified: Secondary | ICD-10-CM | POA: Diagnosis not present

## 2023-06-11 DIAGNOSIS — Z741 Need for assistance with personal care: Secondary | ICD-10-CM | POA: Diagnosis not present

## 2023-06-12 DIAGNOSIS — Z741 Need for assistance with personal care: Secondary | ICD-10-CM | POA: Diagnosis not present

## 2023-06-12 DIAGNOSIS — I69314 Frontal lobe and executive function deficit following cerebral infarction: Secondary | ICD-10-CM | POA: Diagnosis not present

## 2023-06-12 DIAGNOSIS — I69354 Hemiplegia and hemiparesis following cerebral infarction affecting left non-dominant side: Secondary | ICD-10-CM | POA: Diagnosis not present

## 2023-06-12 DIAGNOSIS — M6281 Muscle weakness (generalized): Secondary | ICD-10-CM | POA: Diagnosis not present

## 2023-06-12 DIAGNOSIS — R1310 Dysphagia, unspecified: Secondary | ICD-10-CM | POA: Diagnosis not present

## 2023-06-12 DIAGNOSIS — I25110 Atherosclerotic heart disease of native coronary artery with unstable angina pectoris: Secondary | ICD-10-CM | POA: Diagnosis not present

## 2023-06-12 DIAGNOSIS — U071 COVID-19: Secondary | ICD-10-CM | POA: Diagnosis not present

## 2023-06-15 DIAGNOSIS — I25110 Atherosclerotic heart disease of native coronary artery with unstable angina pectoris: Secondary | ICD-10-CM | POA: Diagnosis not present

## 2023-06-15 DIAGNOSIS — I69314 Frontal lobe and executive function deficit following cerebral infarction: Secondary | ICD-10-CM | POA: Diagnosis not present

## 2023-06-15 DIAGNOSIS — U071 COVID-19: Secondary | ICD-10-CM | POA: Diagnosis not present

## 2023-06-15 DIAGNOSIS — R1310 Dysphagia, unspecified: Secondary | ICD-10-CM | POA: Diagnosis not present

## 2023-06-15 DIAGNOSIS — M6281 Muscle weakness (generalized): Secondary | ICD-10-CM | POA: Diagnosis not present

## 2023-06-15 DIAGNOSIS — I69354 Hemiplegia and hemiparesis following cerebral infarction affecting left non-dominant side: Secondary | ICD-10-CM | POA: Diagnosis not present

## 2023-06-15 DIAGNOSIS — Z741 Need for assistance with personal care: Secondary | ICD-10-CM | POA: Diagnosis not present

## 2023-06-16 DIAGNOSIS — Z741 Need for assistance with personal care: Secondary | ICD-10-CM | POA: Diagnosis not present

## 2023-06-16 DIAGNOSIS — I69354 Hemiplegia and hemiparesis following cerebral infarction affecting left non-dominant side: Secondary | ICD-10-CM | POA: Diagnosis not present

## 2023-06-16 DIAGNOSIS — M6281 Muscle weakness (generalized): Secondary | ICD-10-CM | POA: Diagnosis not present

## 2023-06-16 DIAGNOSIS — I25110 Atherosclerotic heart disease of native coronary artery with unstable angina pectoris: Secondary | ICD-10-CM | POA: Diagnosis not present

## 2023-06-16 DIAGNOSIS — I69314 Frontal lobe and executive function deficit following cerebral infarction: Secondary | ICD-10-CM | POA: Diagnosis not present

## 2023-06-16 DIAGNOSIS — U071 COVID-19: Secondary | ICD-10-CM | POA: Diagnosis not present

## 2023-06-16 DIAGNOSIS — Z87820 Personal history of traumatic brain injury: Secondary | ICD-10-CM | POA: Diagnosis not present

## 2023-06-16 DIAGNOSIS — R1310 Dysphagia, unspecified: Secondary | ICD-10-CM | POA: Diagnosis not present

## 2023-06-18 ENCOUNTER — Ambulatory Visit: Payer: Medicare Other | Attending: Pulmonary Disease | Admitting: Pulmonary Disease

## 2023-06-18 DIAGNOSIS — M6281 Muscle weakness (generalized): Secondary | ICD-10-CM | POA: Diagnosis not present

## 2023-06-18 DIAGNOSIS — R1310 Dysphagia, unspecified: Secondary | ICD-10-CM | POA: Diagnosis not present

## 2023-06-18 DIAGNOSIS — Z741 Need for assistance with personal care: Secondary | ICD-10-CM | POA: Diagnosis not present

## 2023-06-18 DIAGNOSIS — I25110 Atherosclerotic heart disease of native coronary artery with unstable angina pectoris: Secondary | ICD-10-CM | POA: Diagnosis not present

## 2023-06-18 DIAGNOSIS — I69354 Hemiplegia and hemiparesis following cerebral infarction affecting left non-dominant side: Secondary | ICD-10-CM | POA: Diagnosis not present

## 2023-06-18 DIAGNOSIS — U071 COVID-19: Secondary | ICD-10-CM | POA: Diagnosis not present

## 2023-06-18 DIAGNOSIS — I69314 Frontal lobe and executive function deficit following cerebral infarction: Secondary | ICD-10-CM | POA: Diagnosis not present

## 2023-06-18 NOTE — Progress Notes (Deleted)
  Electrophysiology Office Note:   Date:  06/18/2023  ID:  Anna Hoffman, DOB 1935/01/01, MRN [id number]  Primary Cardiologist: Little Ishikawa, MD Electrophysiologist: None  {Click to update primary MD,subspecialty MD or APP then REFRESH:1}    History of Present Illness:   Anna Hoffman is a 88 y.o. female with h/o HTN, HLD, ICH, HTN with prior hypertensive emergency, HOCM, aortic atherosclerosis seen today for routine electrophysiology followup.   Since last being seen in our clinic the patient reports doing ***.  she denies chest pain, palpitations, dyspnea, PND, orthopnea, nausea, vomiting, dizziness, syncope, edema, weight gain, or early satiety.   Review of systems complete and found to be negative unless listed in HPI.   EP Information / Studies Reviewed:    {EKGtoday:28818}      Studies:  ***    Arrhythmia / AAD ***   Device ***   Risk Assessment/Calculations:   {Does this patient have ATRIAL FIBRILLATION?:[id number]} No BP recorded.  {Refresh Note OR Click here to enter BP  :1}***        Physical Exam:   VS:  LMP  (LMP Unknown)    Wt Readings from Last 3 Encounters:  06/12/22 141 lb (64 kg)  12/05/21 120 lb (54.4 kg)  10/07/21 120 lb (54.4 kg)     GEN: Well nourished, well developed in no acute distress NECK: No JVD; No carotid bruits CARDIAC: {EPRHYTHM:28826}, no murmurs, rubs, gallops RESPIRATORY:  Clear to auscultation without rales, wheezing or rhonchi  ABDOMEN: Soft, non-tender, non-distended EXTREMITIES:  No edema; No deformity   ASSESSMENT AND PLAN:    Heart Block / AV WB  Hx Syncope Prior 2:1 block, Mobitz 2 at baseline with prolonged PR interval.  -previously offered PPM, declined  -watchful waiting  -EKG ***   HCM -euvolemic on exam  -no dyspnea  -tolerating low dose metoprolol   CAD  -per primary   Hypertension  -well controlled on current regimen ***   Follow up with {QMVHQ:46962} {EPFOLLOW  XB:28413}  Signed, Canary Brim, MSN, APRN, NP-C, AGACNP-BC [facility] - Electrophysiology  06/18/2023, 7:56 AM

## 2023-06-19 DIAGNOSIS — I25110 Atherosclerotic heart disease of native coronary artery with unstable angina pectoris: Secondary | ICD-10-CM | POA: Diagnosis not present

## 2023-06-19 DIAGNOSIS — R1310 Dysphagia, unspecified: Secondary | ICD-10-CM | POA: Diagnosis not present

## 2023-06-19 DIAGNOSIS — U071 COVID-19: Secondary | ICD-10-CM | POA: Diagnosis not present

## 2023-06-19 DIAGNOSIS — M6281 Muscle weakness (generalized): Secondary | ICD-10-CM | POA: Diagnosis not present

## 2023-06-19 DIAGNOSIS — Z741 Need for assistance with personal care: Secondary | ICD-10-CM | POA: Diagnosis not present

## 2023-06-19 DIAGNOSIS — I69314 Frontal lobe and executive function deficit following cerebral infarction: Secondary | ICD-10-CM | POA: Diagnosis not present

## 2023-06-19 DIAGNOSIS — I69354 Hemiplegia and hemiparesis following cerebral infarction affecting left non-dominant side: Secondary | ICD-10-CM | POA: Diagnosis not present

## 2023-06-22 DIAGNOSIS — I69314 Frontal lobe and executive function deficit following cerebral infarction: Secondary | ICD-10-CM | POA: Diagnosis not present

## 2023-06-22 DIAGNOSIS — I69354 Hemiplegia and hemiparesis following cerebral infarction affecting left non-dominant side: Secondary | ICD-10-CM | POA: Diagnosis not present

## 2023-06-22 DIAGNOSIS — Z741 Need for assistance with personal care: Secondary | ICD-10-CM | POA: Diagnosis not present

## 2023-06-22 DIAGNOSIS — R1310 Dysphagia, unspecified: Secondary | ICD-10-CM | POA: Diagnosis not present

## 2023-06-22 DIAGNOSIS — I25110 Atherosclerotic heart disease of native coronary artery with unstable angina pectoris: Secondary | ICD-10-CM | POA: Diagnosis not present

## 2023-06-22 DIAGNOSIS — M6281 Muscle weakness (generalized): Secondary | ICD-10-CM | POA: Diagnosis not present

## 2023-06-22 DIAGNOSIS — U071 COVID-19: Secondary | ICD-10-CM | POA: Diagnosis not present

## 2023-07-09 DIAGNOSIS — I129 Hypertensive chronic kidney disease with stage 1 through stage 4 chronic kidney disease, or unspecified chronic kidney disease: Secondary | ICD-10-CM | POA: Diagnosis not present

## 2023-07-09 DIAGNOSIS — Z7982 Long term (current) use of aspirin: Secondary | ICD-10-CM | POA: Diagnosis not present

## 2023-07-09 DIAGNOSIS — E782 Mixed hyperlipidemia: Secondary | ICD-10-CM | POA: Diagnosis not present

## 2023-07-09 DIAGNOSIS — Z8673 Personal history of transient ischemic attack (TIA), and cerebral infarction without residual deficits: Secondary | ICD-10-CM | POA: Diagnosis not present

## 2023-07-09 DIAGNOSIS — E1122 Type 2 diabetes mellitus with diabetic chronic kidney disease: Secondary | ICD-10-CM | POA: Diagnosis not present

## 2023-07-09 DIAGNOSIS — I77810 Thoracic aortic ectasia: Secondary | ICD-10-CM | POA: Diagnosis not present

## 2023-07-09 DIAGNOSIS — Z8679 Personal history of other diseases of the circulatory system: Secondary | ICD-10-CM | POA: Diagnosis not present

## 2023-07-09 DIAGNOSIS — I700 Atherosclerosis of aorta: Secondary | ICD-10-CM | POA: Diagnosis not present

## 2023-07-09 DIAGNOSIS — I252 Old myocardial infarction: Secondary | ICD-10-CM | POA: Diagnosis not present

## 2023-07-09 DIAGNOSIS — Z7901 Long term (current) use of anticoagulants: Secondary | ICD-10-CM | POA: Diagnosis not present

## 2023-07-09 DIAGNOSIS — I25110 Atherosclerotic heart disease of native coronary artery with unstable angina pectoris: Secondary | ICD-10-CM | POA: Diagnosis not present

## 2023-07-09 DIAGNOSIS — N1831 Chronic kidney disease, stage 3a: Secondary | ICD-10-CM | POA: Diagnosis not present

## 2023-07-14 DIAGNOSIS — Z87820 Personal history of traumatic brain injury: Secondary | ICD-10-CM | POA: Diagnosis not present

## 2023-07-14 DIAGNOSIS — G8929 Other chronic pain: Secondary | ICD-10-CM | POA: Diagnosis not present

## 2023-07-15 DIAGNOSIS — I129 Hypertensive chronic kidney disease with stage 1 through stage 4 chronic kidney disease, or unspecified chronic kidney disease: Secondary | ICD-10-CM | POA: Diagnosis not present

## 2023-07-15 DIAGNOSIS — Z7902 Long term (current) use of antithrombotics/antiplatelets: Secondary | ICD-10-CM | POA: Diagnosis not present

## 2023-07-15 DIAGNOSIS — M1990 Unspecified osteoarthritis, unspecified site: Secondary | ICD-10-CM | POA: Diagnosis not present

## 2023-07-15 DIAGNOSIS — I2510 Atherosclerotic heart disease of native coronary artery without angina pectoris: Secondary | ICD-10-CM | POA: Diagnosis not present

## 2023-07-15 DIAGNOSIS — E1159 Type 2 diabetes mellitus with other circulatory complications: Secondary | ICD-10-CM | POA: Diagnosis not present

## 2023-07-15 DIAGNOSIS — K219 Gastro-esophageal reflux disease without esophagitis: Secondary | ICD-10-CM | POA: Diagnosis not present

## 2023-07-15 DIAGNOSIS — Z7982 Long term (current) use of aspirin: Secondary | ICD-10-CM | POA: Diagnosis not present

## 2023-07-15 DIAGNOSIS — N189 Chronic kidney disease, unspecified: Secondary | ICD-10-CM | POA: Diagnosis not present

## 2023-07-15 DIAGNOSIS — I69354 Hemiplegia and hemiparesis following cerebral infarction affecting left non-dominant side: Secondary | ICD-10-CM | POA: Diagnosis not present

## 2023-07-21 DIAGNOSIS — Z961 Presence of intraocular lens: Secondary | ICD-10-CM | POA: Diagnosis not present

## 2023-07-21 DIAGNOSIS — E1122 Type 2 diabetes mellitus with diabetic chronic kidney disease: Secondary | ICD-10-CM | POA: Diagnosis not present

## 2023-07-21 DIAGNOSIS — H47012 Ischemic optic neuropathy, left eye: Secondary | ICD-10-CM | POA: Diagnosis not present

## 2023-07-21 DIAGNOSIS — H524 Presbyopia: Secondary | ICD-10-CM | POA: Diagnosis not present

## 2023-07-31 DIAGNOSIS — Z743 Need for continuous supervision: Secondary | ICD-10-CM | POA: Diagnosis not present

## 2023-07-31 DIAGNOSIS — Z79899 Other long term (current) drug therapy: Secondary | ICD-10-CM | POA: Diagnosis not present

## 2023-07-31 DIAGNOSIS — R9089 Other abnormal findings on diagnostic imaging of central nervous system: Secondary | ICD-10-CM | POA: Diagnosis not present

## 2023-07-31 DIAGNOSIS — R531 Weakness: Secondary | ICD-10-CM | POA: Diagnosis not present

## 2023-07-31 DIAGNOSIS — M170 Bilateral primary osteoarthritis of knee: Secondary | ICD-10-CM | POA: Diagnosis not present

## 2023-07-31 DIAGNOSIS — I421 Obstructive hypertrophic cardiomyopathy: Secondary | ICD-10-CM | POA: Diagnosis not present

## 2023-07-31 DIAGNOSIS — Z471 Aftercare following joint replacement surgery: Secondary | ICD-10-CM | POA: Diagnosis not present

## 2023-07-31 DIAGNOSIS — Z7902 Long term (current) use of antithrombotics/antiplatelets: Secondary | ICD-10-CM | POA: Diagnosis not present

## 2023-07-31 DIAGNOSIS — R4781 Slurred speech: Secondary | ICD-10-CM | POA: Diagnosis not present

## 2023-07-31 DIAGNOSIS — E876 Hypokalemia: Secondary | ICD-10-CM | POA: Diagnosis not present

## 2023-07-31 DIAGNOSIS — R9389 Abnormal findings on diagnostic imaging of other specified body structures: Secondary | ICD-10-CM | POA: Diagnosis not present

## 2023-07-31 DIAGNOSIS — I639 Cerebral infarction, unspecified: Secondary | ICD-10-CM | POA: Diagnosis not present

## 2023-07-31 DIAGNOSIS — Z881 Allergy status to other antibiotic agents status: Secondary | ICD-10-CM | POA: Diagnosis not present

## 2023-07-31 DIAGNOSIS — I080 Rheumatic disorders of both mitral and aortic valves: Secondary | ICD-10-CM | POA: Diagnosis not present

## 2023-07-31 DIAGNOSIS — R471 Dysarthria and anarthria: Secondary | ICD-10-CM | POA: Diagnosis not present

## 2023-07-31 DIAGNOSIS — G4489 Other headache syndrome: Secondary | ICD-10-CM | POA: Diagnosis not present

## 2023-07-31 DIAGNOSIS — I25119 Atherosclerotic heart disease of native coronary artery with unspecified angina pectoris: Secondary | ICD-10-CM | POA: Diagnosis not present

## 2023-07-31 DIAGNOSIS — E049 Nontoxic goiter, unspecified: Secondary | ICD-10-CM | POA: Diagnosis not present

## 2023-07-31 DIAGNOSIS — Z20822 Contact with and (suspected) exposure to covid-19: Secondary | ICD-10-CM | POA: Diagnosis not present

## 2023-07-31 DIAGNOSIS — R0989 Other specified symptoms and signs involving the circulatory and respiratory systems: Secondary | ICD-10-CM | POA: Diagnosis not present

## 2023-07-31 DIAGNOSIS — R410 Disorientation, unspecified: Secondary | ICD-10-CM | POA: Diagnosis not present

## 2023-07-31 DIAGNOSIS — I618 Other nontraumatic intracerebral hemorrhage: Secondary | ICD-10-CM | POA: Diagnosis not present

## 2023-07-31 DIAGNOSIS — E782 Mixed hyperlipidemia: Secondary | ICD-10-CM | POA: Diagnosis not present

## 2023-07-31 DIAGNOSIS — I6389 Other cerebral infarction: Secondary | ICD-10-CM | POA: Diagnosis not present

## 2023-07-31 DIAGNOSIS — I6381 Other cerebral infarction due to occlusion or stenosis of small artery: Secondary | ICD-10-CM | POA: Diagnosis not present

## 2023-07-31 DIAGNOSIS — I679 Cerebrovascular disease, unspecified: Secondary | ICD-10-CM | POA: Diagnosis not present

## 2023-07-31 DIAGNOSIS — N1831 Chronic kidney disease, stage 3a: Secondary | ICD-10-CM | POA: Diagnosis not present

## 2023-07-31 DIAGNOSIS — R0789 Other chest pain: Secondary | ICD-10-CM | POA: Diagnosis not present

## 2023-07-31 DIAGNOSIS — M4699 Unspecified inflammatory spondylopathy, multiple sites in spine: Secondary | ICD-10-CM | POA: Diagnosis not present

## 2023-07-31 DIAGNOSIS — E785 Hyperlipidemia, unspecified: Secondary | ICD-10-CM | POA: Diagnosis not present

## 2023-07-31 DIAGNOSIS — R400 Somnolence: Secondary | ICD-10-CM | POA: Diagnosis not present

## 2023-07-31 DIAGNOSIS — I6501 Occlusion and stenosis of right vertebral artery: Secondary | ICD-10-CM | POA: Diagnosis not present

## 2023-07-31 DIAGNOSIS — I6523 Occlusion and stenosis of bilateral carotid arteries: Secondary | ICD-10-CM | POA: Diagnosis not present

## 2023-07-31 DIAGNOSIS — Z882 Allergy status to sulfonamides status: Secondary | ICD-10-CM | POA: Diagnosis not present

## 2023-07-31 DIAGNOSIS — I6522 Occlusion and stenosis of left carotid artery: Secondary | ICD-10-CM | POA: Diagnosis not present

## 2023-07-31 DIAGNOSIS — Z681 Body mass index (BMI) 19 or less, adult: Secondary | ICD-10-CM | POA: Diagnosis not present

## 2023-07-31 DIAGNOSIS — Z880 Allergy status to penicillin: Secondary | ICD-10-CM | POA: Diagnosis not present

## 2023-07-31 DIAGNOSIS — I610 Nontraumatic intracerebral hemorrhage in hemisphere, subcortical: Secondary | ICD-10-CM | POA: Diagnosis not present

## 2023-07-31 DIAGNOSIS — E1122 Type 2 diabetes mellitus with diabetic chronic kidney disease: Secondary | ICD-10-CM | POA: Diagnosis not present

## 2023-07-31 DIAGNOSIS — R636 Underweight: Secondary | ICD-10-CM | POA: Diagnosis not present

## 2023-07-31 DIAGNOSIS — I5181 Takotsubo syndrome: Secondary | ICD-10-CM | POA: Diagnosis not present

## 2023-07-31 DIAGNOSIS — I129 Hypertensive chronic kidney disease with stage 1 through stage 4 chronic kidney disease, or unspecified chronic kidney disease: Secondary | ICD-10-CM | POA: Diagnosis not present

## 2023-07-31 DIAGNOSIS — I440 Atrioventricular block, first degree: Secondary | ICD-10-CM | POA: Diagnosis not present

## 2023-07-31 DIAGNOSIS — R29701 NIHSS score 1: Secondary | ICD-10-CM | POA: Diagnosis not present

## 2023-08-01 DIAGNOSIS — I6501 Occlusion and stenosis of right vertebral artery: Secondary | ICD-10-CM | POA: Diagnosis not present

## 2023-08-01 DIAGNOSIS — I610 Nontraumatic intracerebral hemorrhage in hemisphere, subcortical: Secondary | ICD-10-CM | POA: Diagnosis not present

## 2023-08-01 DIAGNOSIS — I639 Cerebral infarction, unspecified: Secondary | ICD-10-CM | POA: Diagnosis not present

## 2023-08-01 DIAGNOSIS — E049 Nontoxic goiter, unspecified: Secondary | ICD-10-CM | POA: Diagnosis not present

## 2023-08-01 DIAGNOSIS — I6523 Occlusion and stenosis of bilateral carotid arteries: Secondary | ICD-10-CM | POA: Diagnosis not present

## 2023-08-02 DIAGNOSIS — I639 Cerebral infarction, unspecified: Secondary | ICD-10-CM | POA: Diagnosis not present

## 2023-08-03 DIAGNOSIS — I639 Cerebral infarction, unspecified: Secondary | ICD-10-CM | POA: Diagnosis not present

## 2023-08-04 DIAGNOSIS — D631 Anemia in chronic kidney disease: Secondary | ICD-10-CM | POA: Diagnosis not present

## 2023-08-04 DIAGNOSIS — E1169 Type 2 diabetes mellitus with other specified complication: Secondary | ICD-10-CM | POA: Diagnosis not present

## 2023-08-04 DIAGNOSIS — Z743 Need for continuous supervision: Secondary | ICD-10-CM | POA: Diagnosis not present

## 2023-08-04 DIAGNOSIS — I6381 Other cerebral infarction due to occlusion or stenosis of small artery: Secondary | ICD-10-CM | POA: Diagnosis not present

## 2023-08-04 DIAGNOSIS — I422 Other hypertrophic cardiomyopathy: Secondary | ICD-10-CM | POA: Diagnosis not present

## 2023-08-04 DIAGNOSIS — R1310 Dysphagia, unspecified: Secondary | ICD-10-CM | POA: Diagnosis not present

## 2023-08-04 DIAGNOSIS — E782 Mixed hyperlipidemia: Secondary | ICD-10-CM | POA: Diagnosis not present

## 2023-08-04 DIAGNOSIS — R55 Syncope and collapse: Secondary | ICD-10-CM | POA: Diagnosis not present

## 2023-08-04 DIAGNOSIS — E1122 Type 2 diabetes mellitus with diabetic chronic kidney disease: Secondary | ICD-10-CM | POA: Diagnosis not present

## 2023-08-04 DIAGNOSIS — I5181 Takotsubo syndrome: Secondary | ICD-10-CM | POA: Diagnosis not present

## 2023-08-04 DIAGNOSIS — E559 Vitamin D deficiency, unspecified: Secondary | ICD-10-CM | POA: Diagnosis not present

## 2023-08-04 DIAGNOSIS — G8929 Other chronic pain: Secondary | ICD-10-CM | POA: Diagnosis not present

## 2023-08-04 DIAGNOSIS — Z87820 Personal history of traumatic brain injury: Secondary | ICD-10-CM | POA: Diagnosis not present

## 2023-08-04 DIAGNOSIS — I10 Essential (primary) hypertension: Secondary | ICD-10-CM | POA: Diagnosis not present

## 2023-08-04 DIAGNOSIS — I6522 Occlusion and stenosis of left carotid artery: Secondary | ICD-10-CM | POA: Diagnosis not present

## 2023-08-04 DIAGNOSIS — M4699 Unspecified inflammatory spondylopathy, multiple sites in spine: Secondary | ICD-10-CM | POA: Diagnosis not present

## 2023-08-04 DIAGNOSIS — M170 Bilateral primary osteoarthritis of knee: Secondary | ICD-10-CM | POA: Diagnosis not present

## 2023-08-04 DIAGNOSIS — I679 Cerebrovascular disease, unspecified: Secondary | ICD-10-CM | POA: Diagnosis not present

## 2023-08-04 DIAGNOSIS — I69354 Hemiplegia and hemiparesis following cerebral infarction affecting left non-dominant side: Secondary | ICD-10-CM | POA: Diagnosis not present

## 2023-08-04 DIAGNOSIS — R531 Weakness: Secondary | ICD-10-CM | POA: Diagnosis not present

## 2023-08-04 DIAGNOSIS — I2510 Atherosclerotic heart disease of native coronary artery without angina pectoris: Secondary | ICD-10-CM | POA: Diagnosis not present

## 2023-08-04 DIAGNOSIS — R5381 Other malaise: Secondary | ICD-10-CM | POA: Diagnosis not present

## 2023-08-04 DIAGNOSIS — E872 Acidosis, unspecified: Secondary | ICD-10-CM | POA: Diagnosis not present

## 2023-08-04 DIAGNOSIS — I77810 Thoracic aortic ectasia: Secondary | ICD-10-CM | POA: Diagnosis not present

## 2023-08-04 DIAGNOSIS — I639 Cerebral infarction, unspecified: Secondary | ICD-10-CM | POA: Diagnosis not present

## 2023-08-04 DIAGNOSIS — D649 Anemia, unspecified: Secondary | ICD-10-CM | POA: Diagnosis not present

## 2023-08-04 DIAGNOSIS — N184 Chronic kidney disease, stage 4 (severe): Secondary | ICD-10-CM | POA: Diagnosis not present

## 2023-08-04 DIAGNOSIS — E1159 Type 2 diabetes mellitus with other circulatory complications: Secondary | ICD-10-CM | POA: Diagnosis not present

## 2023-08-04 DIAGNOSIS — I421 Obstructive hypertrophic cardiomyopathy: Secondary | ICD-10-CM | POA: Diagnosis not present

## 2023-08-04 DIAGNOSIS — N183 Chronic kidney disease, stage 3 unspecified: Secondary | ICD-10-CM | POA: Diagnosis not present

## 2023-08-04 DIAGNOSIS — E119 Type 2 diabetes mellitus without complications: Secondary | ICD-10-CM | POA: Diagnosis not present

## 2023-08-04 DIAGNOSIS — I25119 Atherosclerotic heart disease of native coronary artery with unspecified angina pectoris: Secondary | ICD-10-CM | POA: Diagnosis not present

## 2023-08-10 DIAGNOSIS — I77810 Thoracic aortic ectasia: Secondary | ICD-10-CM | POA: Diagnosis not present

## 2023-08-10 DIAGNOSIS — I69354 Hemiplegia and hemiparesis following cerebral infarction affecting left non-dominant side: Secondary | ICD-10-CM | POA: Diagnosis not present

## 2023-08-10 DIAGNOSIS — R1310 Dysphagia, unspecified: Secondary | ICD-10-CM | POA: Diagnosis not present

## 2023-08-10 DIAGNOSIS — D631 Anemia in chronic kidney disease: Secondary | ICD-10-CM | POA: Diagnosis not present

## 2023-08-10 DIAGNOSIS — E1122 Type 2 diabetes mellitus with diabetic chronic kidney disease: Secondary | ICD-10-CM | POA: Diagnosis not present

## 2023-08-10 DIAGNOSIS — E1159 Type 2 diabetes mellitus with other circulatory complications: Secondary | ICD-10-CM | POA: Diagnosis not present

## 2023-08-10 DIAGNOSIS — I2510 Atherosclerotic heart disease of native coronary artery without angina pectoris: Secondary | ICD-10-CM | POA: Diagnosis not present

## 2023-08-10 DIAGNOSIS — I422 Other hypertrophic cardiomyopathy: Secondary | ICD-10-CM | POA: Diagnosis not present

## 2023-08-10 DIAGNOSIS — R5381 Other malaise: Secondary | ICD-10-CM | POA: Diagnosis not present

## 2023-08-10 DIAGNOSIS — I10 Essential (primary) hypertension: Secondary | ICD-10-CM | POA: Diagnosis not present

## 2023-08-10 DIAGNOSIS — N184 Chronic kidney disease, stage 4 (severe): Secondary | ICD-10-CM | POA: Diagnosis not present

## 2023-08-11 DIAGNOSIS — E559 Vitamin D deficiency, unspecified: Secondary | ICD-10-CM | POA: Diagnosis not present

## 2023-08-11 DIAGNOSIS — G8929 Other chronic pain: Secondary | ICD-10-CM | POA: Diagnosis not present

## 2023-08-11 DIAGNOSIS — Z87820 Personal history of traumatic brain injury: Secondary | ICD-10-CM | POA: Diagnosis not present

## 2023-08-11 DIAGNOSIS — E119 Type 2 diabetes mellitus without complications: Secondary | ICD-10-CM | POA: Diagnosis not present

## 2023-08-19 DIAGNOSIS — I10 Essential (primary) hypertension: Secondary | ICD-10-CM | POA: Diagnosis not present

## 2023-08-19 DIAGNOSIS — D649 Anemia, unspecified: Secondary | ICD-10-CM | POA: Diagnosis not present

## 2023-08-19 DIAGNOSIS — E872 Acidosis, unspecified: Secondary | ICD-10-CM | POA: Diagnosis not present

## 2023-08-19 DIAGNOSIS — E1169 Type 2 diabetes mellitus with other specified complication: Secondary | ICD-10-CM | POA: Diagnosis not present

## 2023-08-19 DIAGNOSIS — I421 Obstructive hypertrophic cardiomyopathy: Secondary | ICD-10-CM | POA: Diagnosis not present

## 2023-08-19 DIAGNOSIS — N183 Chronic kidney disease, stage 3 unspecified: Secondary | ICD-10-CM | POA: Diagnosis not present

## 2023-08-19 DIAGNOSIS — R55 Syncope and collapse: Secondary | ICD-10-CM | POA: Diagnosis not present

## 2023-08-31 DIAGNOSIS — I429 Cardiomyopathy, unspecified: Secondary | ICD-10-CM | POA: Diagnosis not present

## 2023-08-31 DIAGNOSIS — I252 Old myocardial infarction: Secondary | ICD-10-CM | POA: Diagnosis not present

## 2023-08-31 DIAGNOSIS — E1159 Type 2 diabetes mellitus with other circulatory complications: Secondary | ICD-10-CM | POA: Diagnosis not present

## 2023-08-31 DIAGNOSIS — K5900 Constipation, unspecified: Secondary | ICD-10-CM | POA: Diagnosis not present

## 2023-08-31 DIAGNOSIS — I69354 Hemiplegia and hemiparesis following cerebral infarction affecting left non-dominant side: Secondary | ICD-10-CM | POA: Diagnosis not present

## 2023-08-31 DIAGNOSIS — N189 Chronic kidney disease, unspecified: Secondary | ICD-10-CM | POA: Diagnosis not present

## 2023-08-31 DIAGNOSIS — I129 Hypertensive chronic kidney disease with stage 1 through stage 4 chronic kidney disease, or unspecified chronic kidney disease: Secondary | ICD-10-CM | POA: Diagnosis not present

## 2023-08-31 DIAGNOSIS — Z7982 Long term (current) use of aspirin: Secondary | ICD-10-CM | POA: Diagnosis not present

## 2023-08-31 DIAGNOSIS — M159 Polyosteoarthritis, unspecified: Secondary | ICD-10-CM | POA: Diagnosis not present

## 2023-08-31 DIAGNOSIS — I2510 Atherosclerotic heart disease of native coronary artery without angina pectoris: Secondary | ICD-10-CM | POA: Diagnosis not present

## 2023-08-31 DIAGNOSIS — K219 Gastro-esophageal reflux disease without esophagitis: Secondary | ICD-10-CM | POA: Diagnosis not present

## 2023-09-04 ENCOUNTER — Encounter: Payer: Self-pay | Admitting: General Practice

## 2023-09-08 DIAGNOSIS — Z87820 Personal history of traumatic brain injury: Secondary | ICD-10-CM | POA: Diagnosis not present

## 2023-09-08 DIAGNOSIS — G8929 Other chronic pain: Secondary | ICD-10-CM | POA: Diagnosis not present

## 2023-09-11 DIAGNOSIS — R262 Difficulty in walking, not elsewhere classified: Secondary | ICD-10-CM | POA: Diagnosis not present

## 2023-09-11 DIAGNOSIS — M6281 Muscle weakness (generalized): Secondary | ICD-10-CM | POA: Diagnosis not present

## 2023-09-11 DIAGNOSIS — M4699 Unspecified inflammatory spondylopathy, multiple sites in spine: Secondary | ICD-10-CM | POA: Diagnosis not present

## 2023-09-11 DIAGNOSIS — M170 Bilateral primary osteoarthritis of knee: Secondary | ICD-10-CM | POA: Diagnosis not present

## 2023-09-14 DIAGNOSIS — R262 Difficulty in walking, not elsewhere classified: Secondary | ICD-10-CM | POA: Diagnosis not present

## 2023-09-14 DIAGNOSIS — M170 Bilateral primary osteoarthritis of knee: Secondary | ICD-10-CM | POA: Diagnosis not present

## 2023-09-14 DIAGNOSIS — M6281 Muscle weakness (generalized): Secondary | ICD-10-CM | POA: Diagnosis not present

## 2023-09-14 DIAGNOSIS — M4699 Unspecified inflammatory spondylopathy, multiple sites in spine: Secondary | ICD-10-CM | POA: Diagnosis not present

## 2023-09-15 DIAGNOSIS — R262 Difficulty in walking, not elsewhere classified: Secondary | ICD-10-CM | POA: Diagnosis not present

## 2023-09-15 DIAGNOSIS — D649 Anemia, unspecified: Secondary | ICD-10-CM | POA: Diagnosis not present

## 2023-09-15 DIAGNOSIS — M170 Bilateral primary osteoarthritis of knee: Secondary | ICD-10-CM | POA: Diagnosis not present

## 2023-09-15 DIAGNOSIS — K769 Liver disease, unspecified: Secondary | ICD-10-CM | POA: Diagnosis not present

## 2023-09-15 DIAGNOSIS — M4699 Unspecified inflammatory spondylopathy, multiple sites in spine: Secondary | ICD-10-CM | POA: Diagnosis not present

## 2023-09-15 DIAGNOSIS — E782 Mixed hyperlipidemia: Secondary | ICD-10-CM | POA: Diagnosis not present

## 2023-09-15 DIAGNOSIS — M6281 Muscle weakness (generalized): Secondary | ICD-10-CM | POA: Diagnosis not present

## 2023-09-16 DIAGNOSIS — R262 Difficulty in walking, not elsewhere classified: Secondary | ICD-10-CM | POA: Diagnosis not present

## 2023-09-16 DIAGNOSIS — M170 Bilateral primary osteoarthritis of knee: Secondary | ICD-10-CM | POA: Diagnosis not present

## 2023-09-16 DIAGNOSIS — M6281 Muscle weakness (generalized): Secondary | ICD-10-CM | POA: Diagnosis not present

## 2023-09-16 DIAGNOSIS — M4699 Unspecified inflammatory spondylopathy, multiple sites in spine: Secondary | ICD-10-CM | POA: Diagnosis not present

## 2023-09-17 DIAGNOSIS — R262 Difficulty in walking, not elsewhere classified: Secondary | ICD-10-CM | POA: Diagnosis not present

## 2023-09-17 DIAGNOSIS — M6281 Muscle weakness (generalized): Secondary | ICD-10-CM | POA: Diagnosis not present

## 2023-09-17 DIAGNOSIS — M170 Bilateral primary osteoarthritis of knee: Secondary | ICD-10-CM | POA: Diagnosis not present

## 2023-09-17 DIAGNOSIS — M4699 Unspecified inflammatory spondylopathy, multiple sites in spine: Secondary | ICD-10-CM | POA: Diagnosis not present

## 2023-09-18 DIAGNOSIS — M4699 Unspecified inflammatory spondylopathy, multiple sites in spine: Secondary | ICD-10-CM | POA: Diagnosis not present

## 2023-09-18 DIAGNOSIS — M170 Bilateral primary osteoarthritis of knee: Secondary | ICD-10-CM | POA: Diagnosis not present

## 2023-09-18 DIAGNOSIS — M6281 Muscle weakness (generalized): Secondary | ICD-10-CM | POA: Diagnosis not present

## 2023-09-18 DIAGNOSIS — R262 Difficulty in walking, not elsewhere classified: Secondary | ICD-10-CM | POA: Diagnosis not present

## 2023-09-21 DIAGNOSIS — M170 Bilateral primary osteoarthritis of knee: Secondary | ICD-10-CM | POA: Diagnosis not present

## 2023-09-21 DIAGNOSIS — R262 Difficulty in walking, not elsewhere classified: Secondary | ICD-10-CM | POA: Diagnosis not present

## 2023-09-21 DIAGNOSIS — M6281 Muscle weakness (generalized): Secondary | ICD-10-CM | POA: Diagnosis not present

## 2023-09-21 DIAGNOSIS — M4699 Unspecified inflammatory spondylopathy, multiple sites in spine: Secondary | ICD-10-CM | POA: Diagnosis not present

## 2023-09-22 DIAGNOSIS — M4699 Unspecified inflammatory spondylopathy, multiple sites in spine: Secondary | ICD-10-CM | POA: Diagnosis not present

## 2023-09-22 DIAGNOSIS — M170 Bilateral primary osteoarthritis of knee: Secondary | ICD-10-CM | POA: Diagnosis not present

## 2023-09-22 DIAGNOSIS — R262 Difficulty in walking, not elsewhere classified: Secondary | ICD-10-CM | POA: Diagnosis not present

## 2023-09-22 DIAGNOSIS — M6281 Muscle weakness (generalized): Secondary | ICD-10-CM | POA: Diagnosis not present

## 2023-09-23 DIAGNOSIS — R262 Difficulty in walking, not elsewhere classified: Secondary | ICD-10-CM | POA: Diagnosis not present

## 2023-09-23 DIAGNOSIS — M6281 Muscle weakness (generalized): Secondary | ICD-10-CM | POA: Diagnosis not present

## 2023-09-23 DIAGNOSIS — M4699 Unspecified inflammatory spondylopathy, multiple sites in spine: Secondary | ICD-10-CM | POA: Diagnosis not present

## 2023-09-23 DIAGNOSIS — M170 Bilateral primary osteoarthritis of knee: Secondary | ICD-10-CM | POA: Diagnosis not present

## 2023-09-24 DIAGNOSIS — M4699 Unspecified inflammatory spondylopathy, multiple sites in spine: Secondary | ICD-10-CM | POA: Diagnosis not present

## 2023-09-24 DIAGNOSIS — M170 Bilateral primary osteoarthritis of knee: Secondary | ICD-10-CM | POA: Diagnosis not present

## 2023-09-24 DIAGNOSIS — M6281 Muscle weakness (generalized): Secondary | ICD-10-CM | POA: Diagnosis not present

## 2023-09-24 DIAGNOSIS — R262 Difficulty in walking, not elsewhere classified: Secondary | ICD-10-CM | POA: Diagnosis not present

## 2023-09-25 DIAGNOSIS — M6281 Muscle weakness (generalized): Secondary | ICD-10-CM | POA: Diagnosis not present

## 2023-09-25 DIAGNOSIS — R262 Difficulty in walking, not elsewhere classified: Secondary | ICD-10-CM | POA: Diagnosis not present

## 2023-09-25 DIAGNOSIS — M4699 Unspecified inflammatory spondylopathy, multiple sites in spine: Secondary | ICD-10-CM | POA: Diagnosis not present

## 2023-09-25 DIAGNOSIS — M170 Bilateral primary osteoarthritis of knee: Secondary | ICD-10-CM | POA: Diagnosis not present

## 2023-09-28 DIAGNOSIS — M6281 Muscle weakness (generalized): Secondary | ICD-10-CM | POA: Diagnosis not present

## 2023-09-28 DIAGNOSIS — R262 Difficulty in walking, not elsewhere classified: Secondary | ICD-10-CM | POA: Diagnosis not present

## 2023-09-28 DIAGNOSIS — M170 Bilateral primary osteoarthritis of knee: Secondary | ICD-10-CM | POA: Diagnosis not present

## 2023-09-28 DIAGNOSIS — M4699 Unspecified inflammatory spondylopathy, multiple sites in spine: Secondary | ICD-10-CM | POA: Diagnosis not present

## 2023-09-29 DIAGNOSIS — M170 Bilateral primary osteoarthritis of knee: Secondary | ICD-10-CM | POA: Diagnosis not present

## 2023-09-29 DIAGNOSIS — M4699 Unspecified inflammatory spondylopathy, multiple sites in spine: Secondary | ICD-10-CM | POA: Diagnosis not present

## 2023-09-29 DIAGNOSIS — R262 Difficulty in walking, not elsewhere classified: Secondary | ICD-10-CM | POA: Diagnosis not present

## 2023-09-29 DIAGNOSIS — M6281 Muscle weakness (generalized): Secondary | ICD-10-CM | POA: Diagnosis not present

## 2023-09-30 DIAGNOSIS — M170 Bilateral primary osteoarthritis of knee: Secondary | ICD-10-CM | POA: Diagnosis not present

## 2023-09-30 DIAGNOSIS — R262 Difficulty in walking, not elsewhere classified: Secondary | ICD-10-CM | POA: Diagnosis not present

## 2023-09-30 DIAGNOSIS — M4699 Unspecified inflammatory spondylopathy, multiple sites in spine: Secondary | ICD-10-CM | POA: Diagnosis not present

## 2023-09-30 DIAGNOSIS — M6281 Muscle weakness (generalized): Secondary | ICD-10-CM | POA: Diagnosis not present

## 2023-10-01 DIAGNOSIS — M4699 Unspecified inflammatory spondylopathy, multiple sites in spine: Secondary | ICD-10-CM | POA: Diagnosis not present

## 2023-10-01 DIAGNOSIS — M170 Bilateral primary osteoarthritis of knee: Secondary | ICD-10-CM | POA: Diagnosis not present

## 2023-10-01 DIAGNOSIS — R262 Difficulty in walking, not elsewhere classified: Secondary | ICD-10-CM | POA: Diagnosis not present

## 2023-10-01 DIAGNOSIS — M6281 Muscle weakness (generalized): Secondary | ICD-10-CM | POA: Diagnosis not present

## 2023-10-02 DIAGNOSIS — M4699 Unspecified inflammatory spondylopathy, multiple sites in spine: Secondary | ICD-10-CM | POA: Diagnosis not present

## 2023-10-02 DIAGNOSIS — R262 Difficulty in walking, not elsewhere classified: Secondary | ICD-10-CM | POA: Diagnosis not present

## 2023-10-02 DIAGNOSIS — M170 Bilateral primary osteoarthritis of knee: Secondary | ICD-10-CM | POA: Diagnosis not present

## 2023-10-02 DIAGNOSIS — M6281 Muscle weakness (generalized): Secondary | ICD-10-CM | POA: Diagnosis not present

## 2023-10-06 DIAGNOSIS — Z87820 Personal history of traumatic brain injury: Secondary | ICD-10-CM | POA: Diagnosis not present

## 2023-10-07 DIAGNOSIS — M47812 Spondylosis without myelopathy or radiculopathy, cervical region: Secondary | ICD-10-CM | POA: Diagnosis not present

## 2023-10-07 DIAGNOSIS — R404 Transient alteration of awareness: Secondary | ICD-10-CM | POA: Diagnosis not present

## 2023-10-07 DIAGNOSIS — M4312 Spondylolisthesis, cervical region: Secondary | ICD-10-CM | POA: Diagnosis not present

## 2023-10-07 DIAGNOSIS — S0181XA Laceration without foreign body of other part of head, initial encounter: Secondary | ICD-10-CM | POA: Diagnosis not present

## 2023-10-07 DIAGNOSIS — S199XXA Unspecified injury of neck, initial encounter: Secondary | ICD-10-CM | POA: Diagnosis not present

## 2023-10-07 DIAGNOSIS — R410 Disorientation, unspecified: Secondary | ICD-10-CM | POA: Diagnosis not present

## 2023-10-07 DIAGNOSIS — W19XXXA Unspecified fall, initial encounter: Secondary | ICD-10-CM | POA: Diagnosis not present

## 2023-10-07 DIAGNOSIS — M4183 Other forms of scoliosis, cervicothoracic region: Secondary | ICD-10-CM | POA: Diagnosis not present

## 2023-10-07 DIAGNOSIS — S01112A Laceration without foreign body of left eyelid and periocular area, initial encounter: Secondary | ICD-10-CM | POA: Diagnosis not present

## 2023-10-07 DIAGNOSIS — R519 Headache, unspecified: Secondary | ICD-10-CM | POA: Diagnosis not present

## 2023-10-07 DIAGNOSIS — R58 Hemorrhage, not elsewhere classified: Secondary | ICD-10-CM | POA: Diagnosis not present

## 2023-10-07 DIAGNOSIS — G459 Transient cerebral ischemic attack, unspecified: Secondary | ICD-10-CM | POA: Diagnosis not present

## 2023-10-07 DIAGNOSIS — I6782 Cerebral ischemia: Secondary | ICD-10-CM | POA: Diagnosis not present

## 2023-10-07 DIAGNOSIS — Z743 Need for continuous supervision: Secondary | ICD-10-CM | POA: Diagnosis not present

## 2023-10-07 DIAGNOSIS — I440 Atrioventricular block, first degree: Secondary | ICD-10-CM | POA: Diagnosis not present

## 2023-10-07 DIAGNOSIS — Z7401 Bed confinement status: Secondary | ICD-10-CM | POA: Diagnosis not present

## 2023-10-09 DIAGNOSIS — S0181XA Laceration without foreign body of other part of head, initial encounter: Secondary | ICD-10-CM | POA: Diagnosis not present

## 2023-10-09 DIAGNOSIS — Z9181 History of falling: Secondary | ICD-10-CM | POA: Diagnosis not present

## 2023-10-09 DIAGNOSIS — I129 Hypertensive chronic kidney disease with stage 1 through stage 4 chronic kidney disease, or unspecified chronic kidney disease: Secondary | ICD-10-CM | POA: Diagnosis not present

## 2023-10-09 DIAGNOSIS — I69354 Hemiplegia and hemiparesis following cerebral infarction affecting left non-dominant side: Secondary | ICD-10-CM | POA: Diagnosis not present

## 2023-10-09 DIAGNOSIS — Z7982 Long term (current) use of aspirin: Secondary | ICD-10-CM | POA: Diagnosis not present

## 2023-10-09 DIAGNOSIS — N189 Chronic kidney disease, unspecified: Secondary | ICD-10-CM | POA: Diagnosis not present

## 2023-10-10 DIAGNOSIS — M79642 Pain in left hand: Secondary | ICD-10-CM | POA: Diagnosis not present

## 2023-10-26 DIAGNOSIS — I639 Cerebral infarction, unspecified: Secondary | ICD-10-CM | POA: Diagnosis not present

## 2023-10-26 DIAGNOSIS — M1990 Unspecified osteoarthritis, unspecified site: Secondary | ICD-10-CM | POA: Diagnosis not present

## 2023-10-26 DIAGNOSIS — Z741 Need for assistance with personal care: Secondary | ICD-10-CM | POA: Diagnosis not present

## 2023-10-26 DIAGNOSIS — I129 Hypertensive chronic kidney disease with stage 1 through stage 4 chronic kidney disease, or unspecified chronic kidney disease: Secondary | ICD-10-CM | POA: Diagnosis not present

## 2023-10-26 DIAGNOSIS — Z7982 Long term (current) use of aspirin: Secondary | ICD-10-CM | POA: Diagnosis not present

## 2023-10-26 DIAGNOSIS — M6281 Muscle weakness (generalized): Secondary | ICD-10-CM | POA: Diagnosis not present

## 2023-10-26 DIAGNOSIS — M25512 Pain in left shoulder: Secondary | ICD-10-CM | POA: Diagnosis not present

## 2023-10-26 DIAGNOSIS — D631 Anemia in chronic kidney disease: Secondary | ICD-10-CM | POA: Diagnosis not present

## 2023-10-26 DIAGNOSIS — I69314 Frontal lobe and executive function deficit following cerebral infarction: Secondary | ICD-10-CM | POA: Diagnosis not present

## 2023-10-26 DIAGNOSIS — N189 Chronic kidney disease, unspecified: Secondary | ICD-10-CM | POA: Diagnosis not present

## 2023-10-26 DIAGNOSIS — K219 Gastro-esophageal reflux disease without esophagitis: Secondary | ICD-10-CM | POA: Diagnosis not present

## 2023-10-26 DIAGNOSIS — I69354 Hemiplegia and hemiparesis following cerebral infarction affecting left non-dominant side: Secondary | ICD-10-CM | POA: Diagnosis not present

## 2023-10-27 DIAGNOSIS — I639 Cerebral infarction, unspecified: Secondary | ICD-10-CM | POA: Diagnosis not present

## 2023-10-27 DIAGNOSIS — Z741 Need for assistance with personal care: Secondary | ICD-10-CM | POA: Diagnosis not present

## 2023-10-27 DIAGNOSIS — M6281 Muscle weakness (generalized): Secondary | ICD-10-CM | POA: Diagnosis not present

## 2023-10-27 DIAGNOSIS — M1990 Unspecified osteoarthritis, unspecified site: Secondary | ICD-10-CM | POA: Diagnosis not present

## 2023-10-27 DIAGNOSIS — M25512 Pain in left shoulder: Secondary | ICD-10-CM | POA: Diagnosis not present

## 2023-10-27 DIAGNOSIS — E782 Mixed hyperlipidemia: Secondary | ICD-10-CM | POA: Diagnosis not present

## 2023-10-27 DIAGNOSIS — I69314 Frontal lobe and executive function deficit following cerebral infarction: Secondary | ICD-10-CM | POA: Diagnosis not present

## 2023-10-27 DIAGNOSIS — R945 Abnormal results of liver function studies: Secondary | ICD-10-CM | POA: Diagnosis not present

## 2023-10-31 DIAGNOSIS — N1831 Chronic kidney disease, stage 3a: Secondary | ICD-10-CM | POA: Diagnosis not present

## 2023-10-31 DIAGNOSIS — Z681 Body mass index (BMI) 19 or less, adult: Secondary | ICD-10-CM | POA: Diagnosis not present

## 2023-10-31 DIAGNOSIS — K869 Disease of pancreas, unspecified: Secondary | ICD-10-CM | POA: Diagnosis not present

## 2023-10-31 DIAGNOSIS — Z860101 Personal history of adenomatous and serrated colon polyps: Secondary | ICD-10-CM | POA: Diagnosis not present

## 2023-10-31 DIAGNOSIS — K449 Diaphragmatic hernia without obstruction or gangrene: Secondary | ICD-10-CM | POA: Diagnosis not present

## 2023-10-31 DIAGNOSIS — I422 Other hypertrophic cardiomyopathy: Secondary | ICD-10-CM | POA: Diagnosis not present

## 2023-10-31 DIAGNOSIS — K8021 Calculus of gallbladder without cholecystitis with obstruction: Secondary | ICD-10-CM | POA: Diagnosis not present

## 2023-10-31 DIAGNOSIS — G9340 Encephalopathy, unspecified: Secondary | ICD-10-CM | POA: Diagnosis not present

## 2023-10-31 DIAGNOSIS — Z7982 Long term (current) use of aspirin: Secondary | ICD-10-CM | POA: Diagnosis not present

## 2023-10-31 DIAGNOSIS — R935 Abnormal findings on diagnostic imaging of other abdominal regions, including retroperitoneum: Secondary | ICD-10-CM | POA: Diagnosis not present

## 2023-10-31 DIAGNOSIS — I10 Essential (primary) hypertension: Secondary | ICD-10-CM | POA: Diagnosis not present

## 2023-10-31 DIAGNOSIS — R9431 Abnormal electrocardiogram [ECG] [EKG]: Secondary | ICD-10-CM | POA: Diagnosis not present

## 2023-10-31 DIAGNOSIS — I619 Nontraumatic intracerebral hemorrhage, unspecified: Secondary | ICD-10-CM | POA: Diagnosis not present

## 2023-10-31 DIAGNOSIS — I129 Hypertensive chronic kidney disease with stage 1 through stage 4 chronic kidney disease, or unspecified chronic kidney disease: Secondary | ICD-10-CM | POA: Diagnosis not present

## 2023-10-31 DIAGNOSIS — I700 Atherosclerosis of aorta: Secondary | ICD-10-CM | POA: Diagnosis not present

## 2023-10-31 DIAGNOSIS — E861 Hypovolemia: Secondary | ICD-10-CM | POA: Diagnosis not present

## 2023-10-31 DIAGNOSIS — E1122 Type 2 diabetes mellitus with diabetic chronic kidney disease: Secondary | ICD-10-CM | POA: Diagnosis not present

## 2023-10-31 DIAGNOSIS — Z66 Do not resuscitate: Secondary | ICD-10-CM | POA: Diagnosis not present

## 2023-10-31 DIAGNOSIS — I440 Atrioventricular block, first degree: Secondary | ICD-10-CM | POA: Diagnosis not present

## 2023-10-31 DIAGNOSIS — E870 Hyperosmolality and hypernatremia: Secondary | ICD-10-CM | POA: Diagnosis not present

## 2023-10-31 DIAGNOSIS — K8033 Calculus of bile duct with acute cholangitis with obstruction: Secondary | ICD-10-CM | POA: Diagnosis not present

## 2023-10-31 DIAGNOSIS — R7989 Other specified abnormal findings of blood chemistry: Secondary | ICD-10-CM | POA: Diagnosis not present

## 2023-10-31 DIAGNOSIS — K8689 Other specified diseases of pancreas: Secondary | ICD-10-CM | POA: Diagnosis not present

## 2023-10-31 DIAGNOSIS — E785 Hyperlipidemia, unspecified: Secondary | ICD-10-CM | POA: Diagnosis not present

## 2023-10-31 DIAGNOSIS — K8309 Other cholangitis: Secondary | ICD-10-CM | POA: Diagnosis not present

## 2023-10-31 DIAGNOSIS — Z8673 Personal history of transient ischemic attack (TIA), and cerebral infarction without residual deficits: Secondary | ICD-10-CM | POA: Diagnosis not present

## 2023-10-31 DIAGNOSIS — I25110 Atherosclerotic heart disease of native coronary artery with unstable angina pectoris: Secondary | ICD-10-CM | POA: Diagnosis not present

## 2023-10-31 DIAGNOSIS — C250 Malignant neoplasm of head of pancreas: Secondary | ICD-10-CM | POA: Diagnosis not present

## 2023-10-31 DIAGNOSIS — K828 Other specified diseases of gallbladder: Secondary | ICD-10-CM | POA: Diagnosis not present

## 2023-10-31 DIAGNOSIS — K838 Other specified diseases of biliary tract: Secondary | ICD-10-CM | POA: Diagnosis not present

## 2023-10-31 DIAGNOSIS — R17 Unspecified jaundice: Secondary | ICD-10-CM | POA: Diagnosis not present

## 2023-10-31 DIAGNOSIS — K7200 Acute and subacute hepatic failure without coma: Secondary | ICD-10-CM | POA: Diagnosis not present

## 2023-10-31 DIAGNOSIS — N289 Disorder of kidney and ureter, unspecified: Secondary | ICD-10-CM | POA: Diagnosis not present

## 2023-10-31 DIAGNOSIS — N179 Acute kidney failure, unspecified: Secondary | ICD-10-CM | POA: Diagnosis not present

## 2023-10-31 DIAGNOSIS — I2510 Atherosclerotic heart disease of native coronary artery without angina pectoris: Secondary | ICD-10-CM | POA: Diagnosis not present

## 2023-10-31 DIAGNOSIS — S0003XA Contusion of scalp, initial encounter: Secondary | ICD-10-CM | POA: Diagnosis not present

## 2023-10-31 DIAGNOSIS — R64 Cachexia: Secondary | ICD-10-CM | POA: Diagnosis not present

## 2023-10-31 DIAGNOSIS — E860 Dehydration: Secondary | ICD-10-CM | POA: Diagnosis not present

## 2023-10-31 DIAGNOSIS — Z79899 Other long term (current) drug therapy: Secondary | ICD-10-CM | POA: Diagnosis not present

## 2023-10-31 DIAGNOSIS — K5730 Diverticulosis of large intestine without perforation or abscess without bleeding: Secondary | ICD-10-CM | POA: Diagnosis not present

## 2023-10-31 DIAGNOSIS — E876 Hypokalemia: Secondary | ICD-10-CM | POA: Diagnosis not present

## 2023-10-31 DIAGNOSIS — E46 Unspecified protein-calorie malnutrition: Secondary | ICD-10-CM | POA: Diagnosis not present

## 2023-10-31 DIAGNOSIS — R930 Abnormal findings on diagnostic imaging of skull and head, not elsewhere classified: Secondary | ICD-10-CM | POA: Diagnosis not present

## 2023-10-31 DIAGNOSIS — K831 Obstruction of bile duct: Secondary | ICD-10-CM | POA: Diagnosis not present

## 2023-11-01 DIAGNOSIS — K8689 Other specified diseases of pancreas: Secondary | ICD-10-CM | POA: Diagnosis not present

## 2023-11-01 DIAGNOSIS — R17 Unspecified jaundice: Secondary | ICD-10-CM | POA: Diagnosis not present

## 2023-11-02 DIAGNOSIS — K449 Diaphragmatic hernia without obstruction or gangrene: Secondary | ICD-10-CM | POA: Diagnosis not present

## 2023-11-02 DIAGNOSIS — E1122 Type 2 diabetes mellitus with diabetic chronic kidney disease: Secondary | ICD-10-CM | POA: Diagnosis not present

## 2023-11-02 DIAGNOSIS — K831 Obstruction of bile duct: Secondary | ICD-10-CM | POA: Diagnosis not present

## 2023-11-02 DIAGNOSIS — R9431 Abnormal electrocardiogram [ECG] [EKG]: Secondary | ICD-10-CM | POA: Diagnosis not present

## 2023-11-02 DIAGNOSIS — N179 Acute kidney failure, unspecified: Secondary | ICD-10-CM | POA: Diagnosis not present

## 2023-11-02 DIAGNOSIS — K8689 Other specified diseases of pancreas: Secondary | ICD-10-CM | POA: Diagnosis not present

## 2023-11-02 DIAGNOSIS — S0003XA Contusion of scalp, initial encounter: Secondary | ICD-10-CM | POA: Diagnosis not present

## 2023-11-02 DIAGNOSIS — E870 Hyperosmolality and hypernatremia: Secondary | ICD-10-CM | POA: Diagnosis not present

## 2023-11-02 DIAGNOSIS — R935 Abnormal findings on diagnostic imaging of other abdominal regions, including retroperitoneum: Secondary | ICD-10-CM | POA: Diagnosis not present

## 2023-11-02 DIAGNOSIS — R17 Unspecified jaundice: Secondary | ICD-10-CM | POA: Diagnosis not present

## 2023-11-02 DIAGNOSIS — Z79899 Other long term (current) drug therapy: Secondary | ICD-10-CM | POA: Diagnosis not present

## 2023-11-02 DIAGNOSIS — G9340 Encephalopathy, unspecified: Secondary | ICD-10-CM | POA: Diagnosis not present

## 2023-11-02 DIAGNOSIS — K8309 Other cholangitis: Secondary | ICD-10-CM | POA: Diagnosis not present

## 2023-11-02 DIAGNOSIS — K869 Disease of pancreas, unspecified: Secondary | ICD-10-CM | POA: Diagnosis not present

## 2023-11-02 DIAGNOSIS — K828 Other specified diseases of gallbladder: Secondary | ICD-10-CM | POA: Diagnosis not present

## 2023-11-02 DIAGNOSIS — E785 Hyperlipidemia, unspecified: Secondary | ICD-10-CM | POA: Diagnosis not present

## 2023-11-02 DIAGNOSIS — C250 Malignant neoplasm of head of pancreas: Secondary | ICD-10-CM | POA: Diagnosis not present

## 2023-11-02 DIAGNOSIS — Z66 Do not resuscitate: Secondary | ICD-10-CM | POA: Diagnosis not present

## 2023-11-02 DIAGNOSIS — N289 Disorder of kidney and ureter, unspecified: Secondary | ICD-10-CM | POA: Diagnosis not present

## 2023-11-02 DIAGNOSIS — K838 Other specified diseases of biliary tract: Secondary | ICD-10-CM | POA: Diagnosis not present

## 2023-11-02 DIAGNOSIS — I440 Atrioventricular block, first degree: Secondary | ICD-10-CM | POA: Diagnosis not present

## 2023-11-02 DIAGNOSIS — R930 Abnormal findings on diagnostic imaging of skull and head, not elsewhere classified: Secondary | ICD-10-CM | POA: Diagnosis not present

## 2023-11-02 DIAGNOSIS — I129 Hypertensive chronic kidney disease with stage 1 through stage 4 chronic kidney disease, or unspecified chronic kidney disease: Secondary | ICD-10-CM | POA: Diagnosis not present

## 2023-11-02 DIAGNOSIS — R7989 Other specified abnormal findings of blood chemistry: Secondary | ICD-10-CM | POA: Diagnosis not present

## 2023-11-02 DIAGNOSIS — Z7982 Long term (current) use of aspirin: Secondary | ICD-10-CM | POA: Diagnosis not present

## 2023-11-02 DIAGNOSIS — N1831 Chronic kidney disease, stage 3a: Secondary | ICD-10-CM | POA: Diagnosis not present

## 2023-11-02 DIAGNOSIS — I25110 Atherosclerotic heart disease of native coronary artery with unstable angina pectoris: Secondary | ICD-10-CM | POA: Diagnosis not present

## 2023-11-02 DIAGNOSIS — Z8673 Personal history of transient ischemic attack (TIA), and cerebral infarction without residual deficits: Secondary | ICD-10-CM | POA: Diagnosis not present

## 2024-02-19 ENCOUNTER — Encounter: Payer: Self-pay | Admitting: Advanced Practice Midwife
